# Patient Record
Sex: Female | Born: 1950 | Race: Black or African American | Hispanic: No | Smoking: Never smoker
Health system: Southern US, Community
[De-identification: ages and names within clinical notes are randomized; demographics above are authoritative.]

## PROBLEM LIST (undated history)

## (undated) DIAGNOSIS — N179 Acute kidney failure, unspecified: Secondary | ICD-10-CM

## (undated) DIAGNOSIS — I10 Essential (primary) hypertension: Secondary | ICD-10-CM

## (undated) DIAGNOSIS — N189 Chronic kidney disease, unspecified: Secondary | ICD-10-CM

## (undated) DIAGNOSIS — D649 Anemia, unspecified: Secondary | ICD-10-CM

## (undated) DIAGNOSIS — I639 Cerebral infarction, unspecified: Secondary | ICD-10-CM

## (undated) DIAGNOSIS — M1990 Unspecified osteoarthritis, unspecified site: Secondary | ICD-10-CM

## (undated) DIAGNOSIS — K5792 Diverticulitis of intestine, part unspecified, without perforation or abscess without bleeding: Secondary | ICD-10-CM

## (undated) HISTORY — DX: Essential (primary) hypertension: I10

## (undated) HISTORY — PX: TONSILLECTOMY: SUR1361

---

## 1998-03-29 ENCOUNTER — Emergency Department (HOSPITAL_COMMUNITY): Admission: EM | Admit: 1998-03-29 | Discharge: 1998-03-29 | Payer: Self-pay | Admitting: Emergency Medicine

## 1998-05-02 ENCOUNTER — Other Ambulatory Visit: Admission: RE | Admit: 1998-05-02 | Discharge: 1998-05-02 | Payer: Self-pay | Admitting: Internal Medicine

## 1999-08-22 ENCOUNTER — Encounter: Payer: Self-pay | Admitting: Emergency Medicine

## 1999-08-22 ENCOUNTER — Inpatient Hospital Stay (HOSPITAL_COMMUNITY): Admission: EM | Admit: 1999-08-22 | Discharge: 1999-08-25 | Payer: Self-pay | Admitting: Emergency Medicine

## 1999-09-18 ENCOUNTER — Encounter: Admission: RE | Admit: 1999-09-18 | Discharge: 1999-09-18 | Payer: Self-pay | Admitting: Hematology and Oncology

## 1999-11-11 ENCOUNTER — Encounter: Admission: RE | Admit: 1999-11-11 | Discharge: 1999-11-11 | Payer: Self-pay | Admitting: Internal Medicine

## 2000-08-06 ENCOUNTER — Emergency Department (HOSPITAL_COMMUNITY): Admission: EM | Admit: 2000-08-06 | Discharge: 2000-08-06 | Payer: Self-pay | Admitting: *Deleted

## 2001-10-24 ENCOUNTER — Emergency Department (HOSPITAL_COMMUNITY): Admission: EM | Admit: 2001-10-24 | Discharge: 2001-10-24 | Payer: Self-pay | Admitting: Emergency Medicine

## 2002-01-03 ENCOUNTER — Emergency Department (HOSPITAL_COMMUNITY): Admission: EM | Admit: 2002-01-03 | Discharge: 2002-01-03 | Payer: Self-pay

## 2003-03-07 ENCOUNTER — Emergency Department (HOSPITAL_COMMUNITY): Admission: EM | Admit: 2003-03-07 | Discharge: 2003-03-07 | Payer: Self-pay | Admitting: Emergency Medicine

## 2003-06-12 ENCOUNTER — Emergency Department (HOSPITAL_COMMUNITY): Admission: EM | Admit: 2003-06-12 | Discharge: 2003-06-12 | Payer: Self-pay | Admitting: Emergency Medicine

## 2003-07-31 ENCOUNTER — Inpatient Hospital Stay (HOSPITAL_COMMUNITY): Admission: EM | Admit: 2003-07-31 | Discharge: 2003-08-01 | Payer: Self-pay | Admitting: Emergency Medicine

## 2003-07-31 ENCOUNTER — Encounter: Payer: Self-pay | Admitting: Emergency Medicine

## 2003-08-04 ENCOUNTER — Encounter: Admission: RE | Admit: 2003-08-04 | Discharge: 2003-08-04 | Payer: Self-pay | Admitting: Internal Medicine

## 2005-02-10 ENCOUNTER — Emergency Department (HOSPITAL_COMMUNITY): Admission: EM | Admit: 2005-02-10 | Discharge: 2005-02-10 | Payer: Self-pay | Admitting: Emergency Medicine

## 2005-03-06 ENCOUNTER — Emergency Department (HOSPITAL_COMMUNITY): Admission: EM | Admit: 2005-03-06 | Discharge: 2005-03-06 | Payer: Self-pay | Admitting: Emergency Medicine

## 2005-05-21 ENCOUNTER — Encounter: Payer: Self-pay | Admitting: Emergency Medicine

## 2005-05-21 ENCOUNTER — Inpatient Hospital Stay (HOSPITAL_COMMUNITY): Admission: AD | Admit: 2005-05-21 | Discharge: 2005-05-25 | Payer: Self-pay | Admitting: Internal Medicine

## 2005-05-22 ENCOUNTER — Encounter (INDEPENDENT_AMBULATORY_CARE_PROVIDER_SITE_OTHER): Payer: Self-pay | Admitting: *Deleted

## 2005-05-22 ENCOUNTER — Encounter: Payer: Self-pay | Admitting: *Deleted

## 2005-09-09 ENCOUNTER — Emergency Department (HOSPITAL_COMMUNITY): Admission: EM | Admit: 2005-09-09 | Discharge: 2005-09-09 | Payer: Self-pay | Admitting: Emergency Medicine
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
## 2006-12-20 IMAGING — CT CT HEAD W/O CM
1 series · 16 of 30 positions shown, 20 images · non-contrast
Comparison: none

CLINICAL DATA: Hypertension, left eye droop, weakness

[Series 2: head_seq 4.5 h42s st · axial · 0.43mm/px · z∈[-171,-45]mm · 16 of 32 slices shown, 20 images]
[im 2/32  brain]
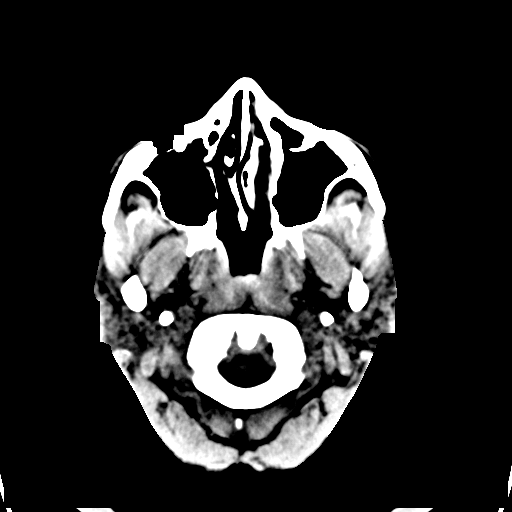
[im 2/32  bone]
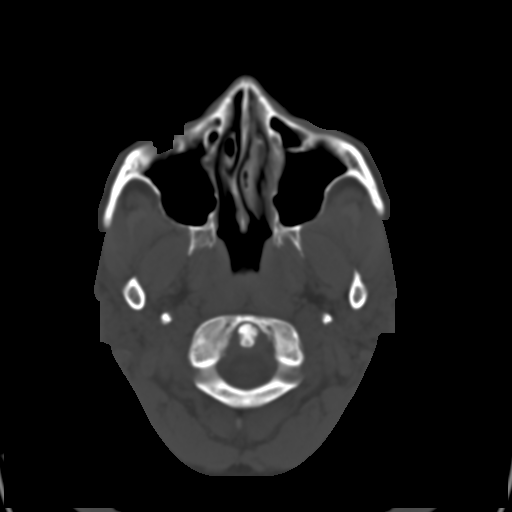
[im 4/32  brain]
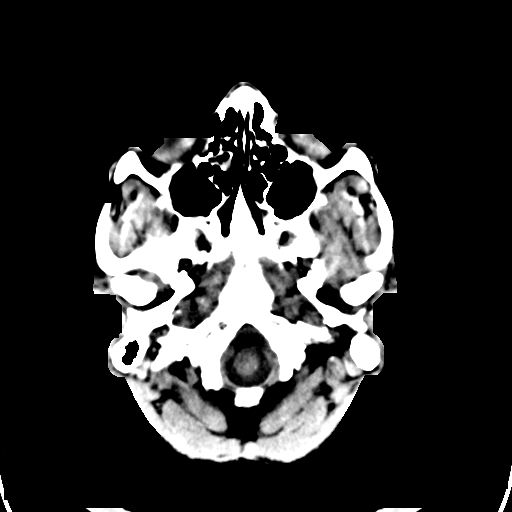
[im 6/32  brain]
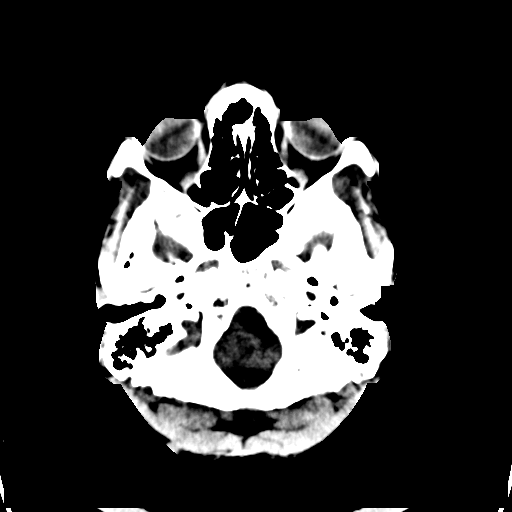
[im 8/32  brain]
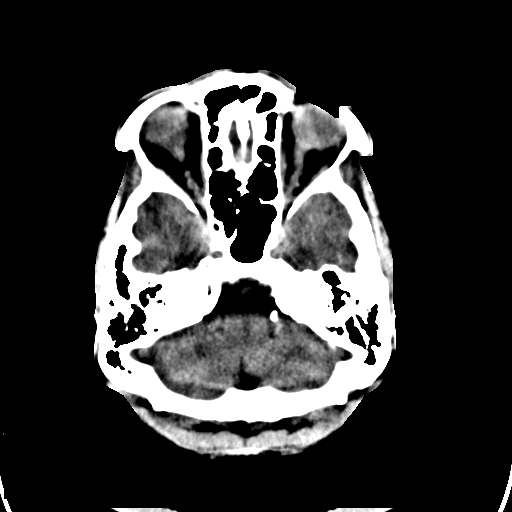
[im 9/32  brain]
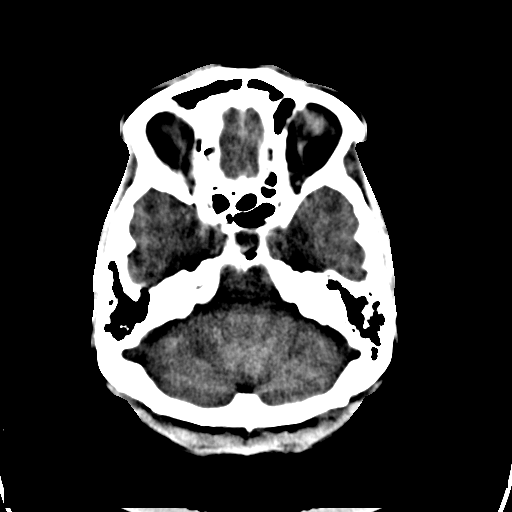
[im 9/32  bone]
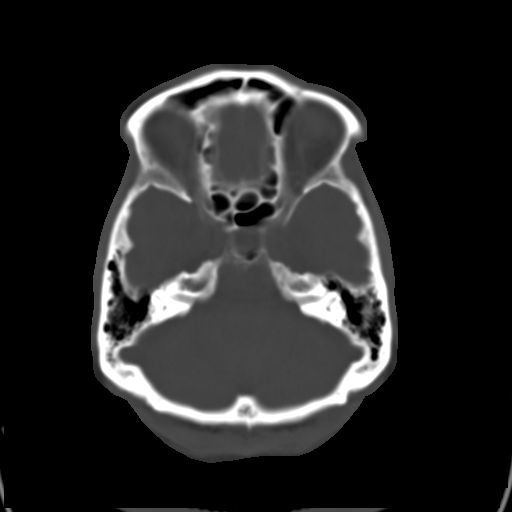
[im 11/32  brain]
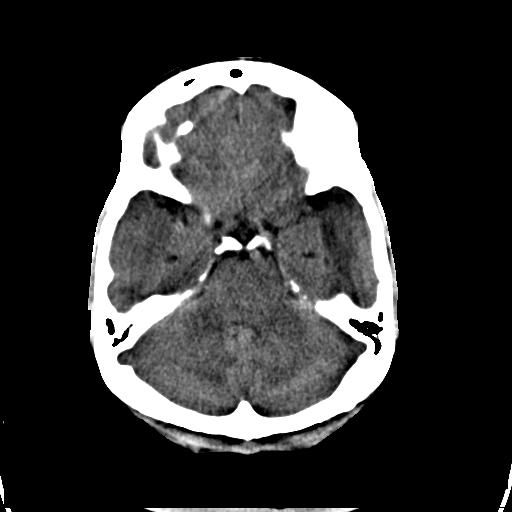
[im 13/32  brain]
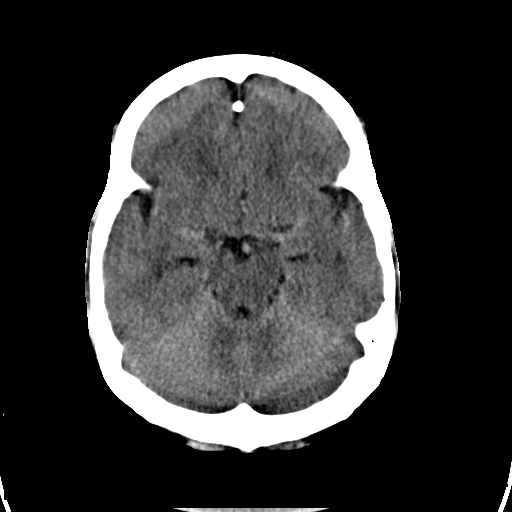
[im 15/32  brain]
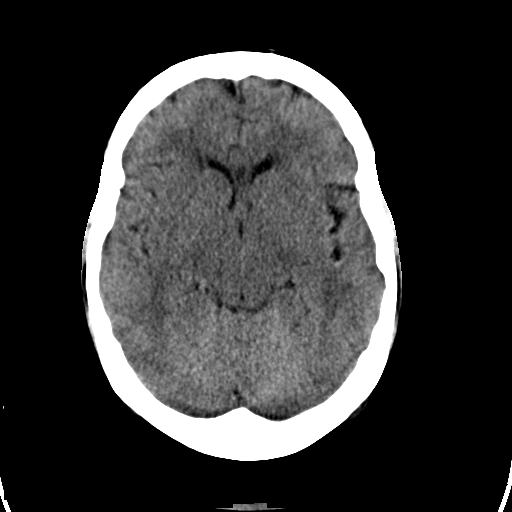
[im 17/32  brain]
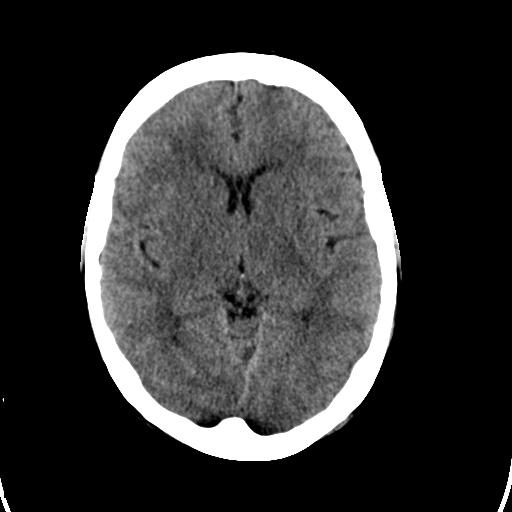
[im 17/32  bone]
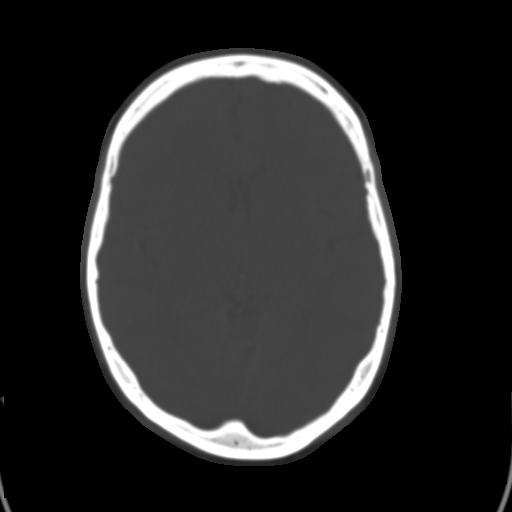
[im 19/32  brain]
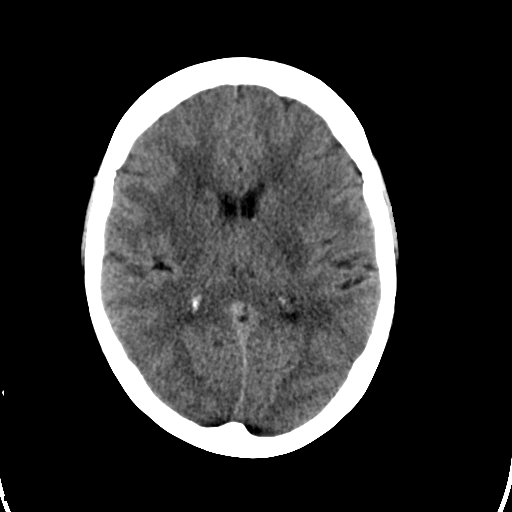
[im 21/32  brain]
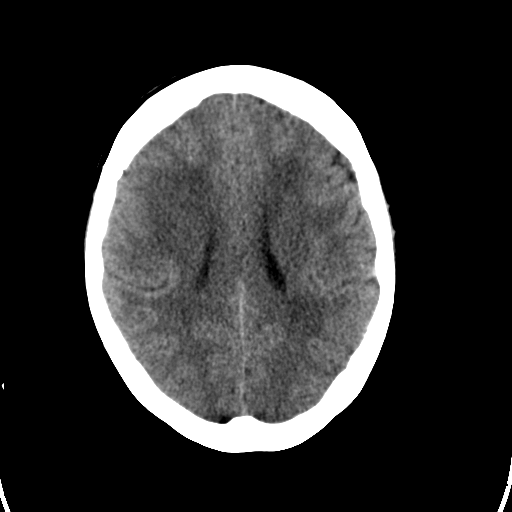
[im 23/32  brain]
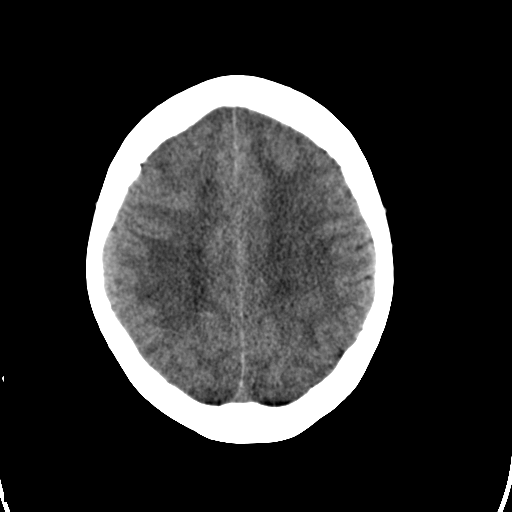
[im 24/32  brain]
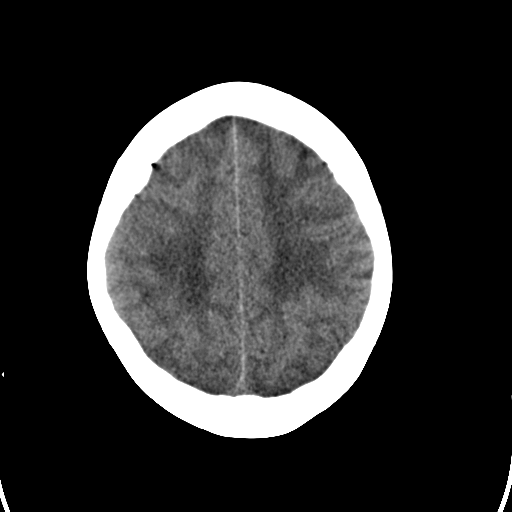
[im 24/32  bone]
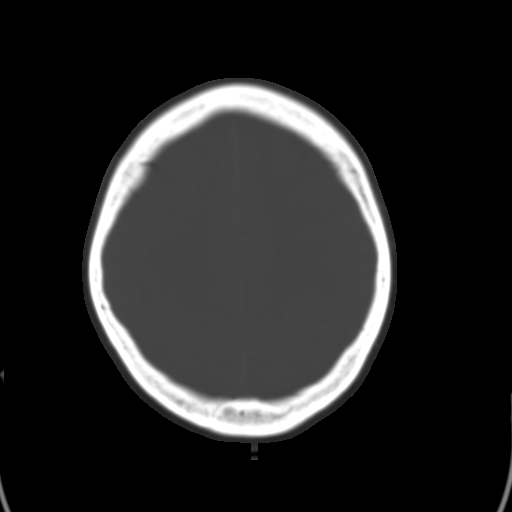
[im 26/32  brain]
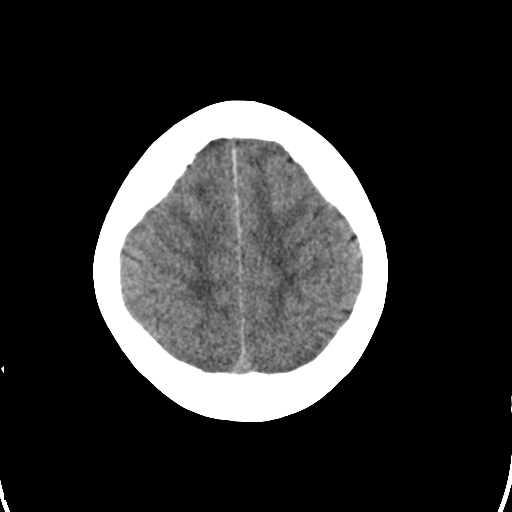
[im 28/32  brain]
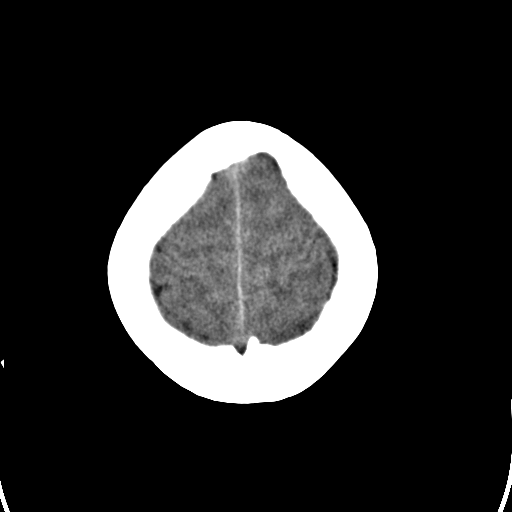
[im 30/32  brain]
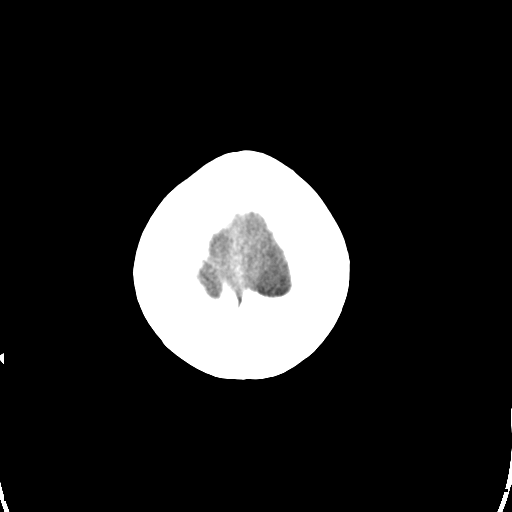

[16 of 30 positions shown; findings below may reference images not displayed]

CT head without contrast:

Comparison 07/31/2003 by report. Mild patchy hypointense regions in deep and
periventricular white matter bilaterally, which were described on the previous
exam. Cavum septum pellucidum. Negative for acute intracranial hemorrhage,
midline shift, or mass-effect. Ventricles and sulci remain symmetric. No
significant atrophy. Bone windows unremarkable.
IMPRESSION: 1. Negative for bleed or other acute intracranial process.
2. Mild nonspecific white matter changes as above

## 2006-12-20 IMAGING — CT CT PELVIS W/ CM
1 of 4 series · 14 of 32 positions shown, 19 images · IV contrast (omnipaque)
Comparison: None

ABDOMEN CT WITH CONTRAST

CLINICAL DATA: Right leg pain. Hypertension. Question left upper quadrant mass
or AAA.
TECHNIQUE: Multidetector CT imaging of the abdomen and pelvis was performed
following the standard protocol during bolus administration of intravenous
contrast.

Contrast:  125 cc Omnipaque 300

[Series 2: abd_pel 5.0 b40f st · axial · 0.62mm/px · z∈[-434,-60]mm · 14 of 85 slices shown, 19 images]
[im 5/85  soft-tissue]
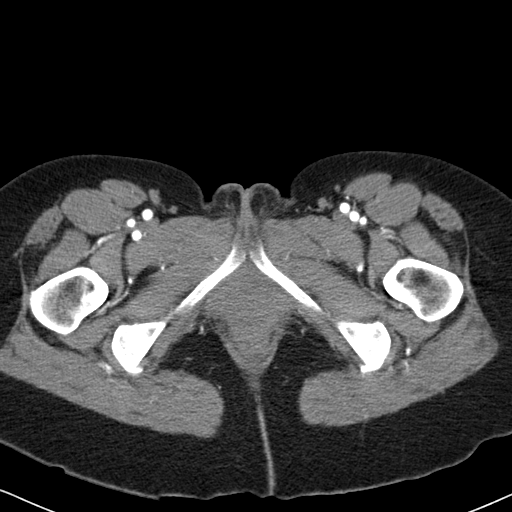
[im 5/85  bone]
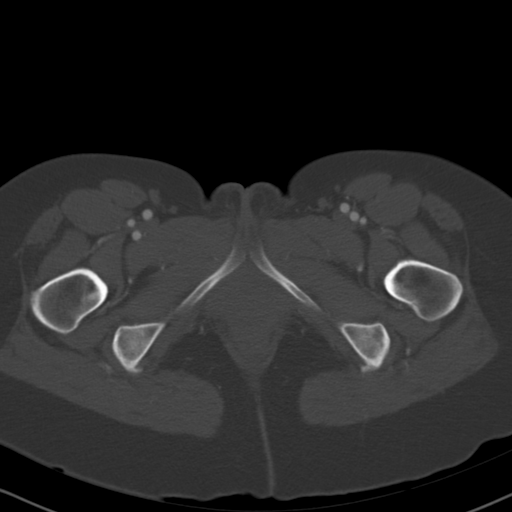
[im 14/85  soft-tissue]
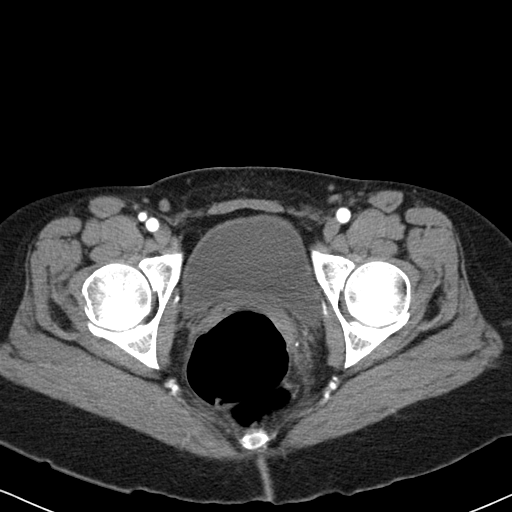
[im 18/85  soft-tissue]
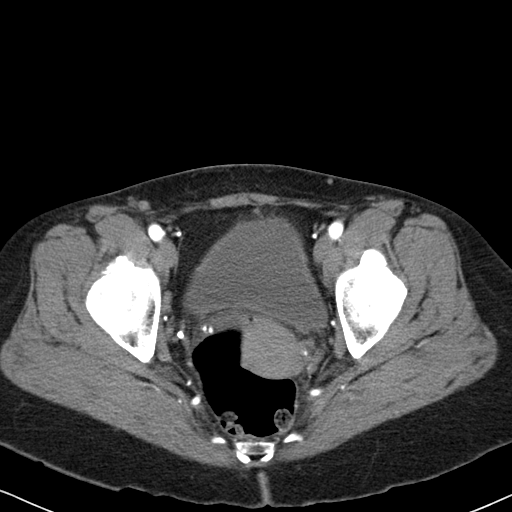
[im 23/85  soft-tissue]
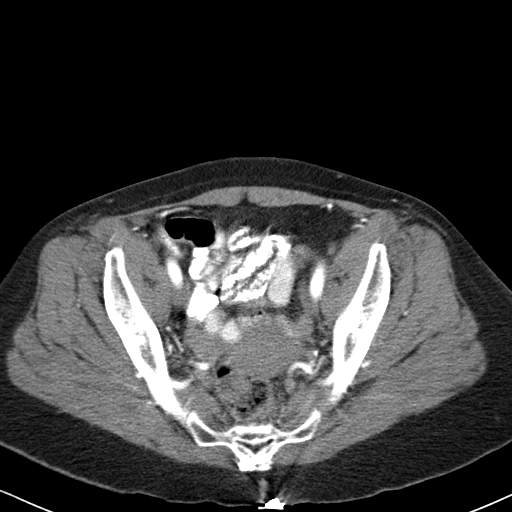
[im 31/85  soft-tissue]
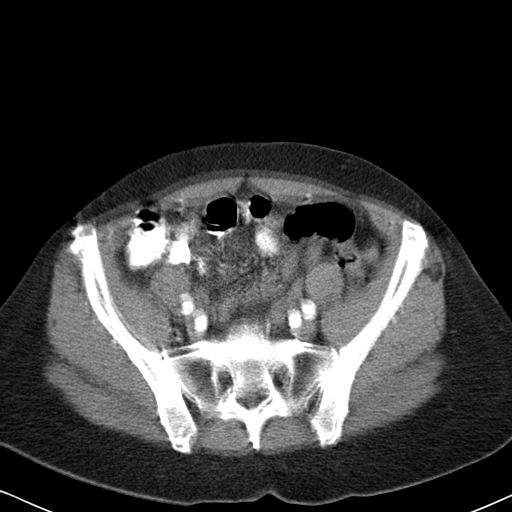
[im 36/85  soft-tissue]
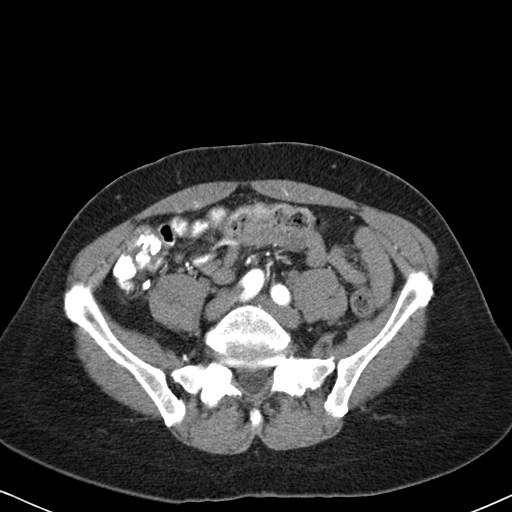
[im 45/85  soft-tissue]
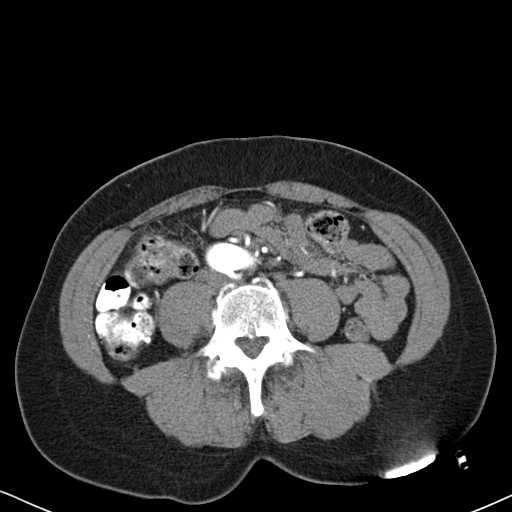
[im 49/85  soft-tissue]
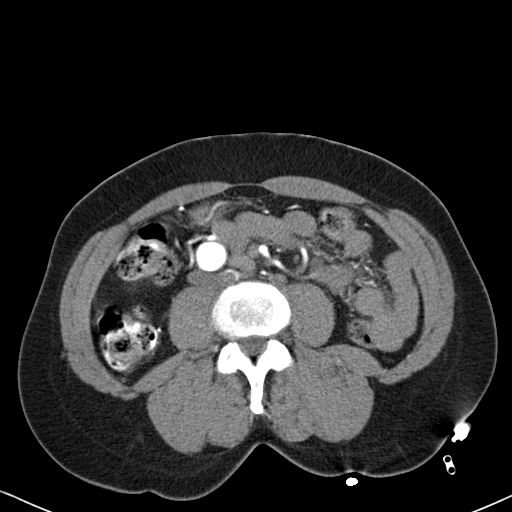
[im 54/85  soft-tissue]
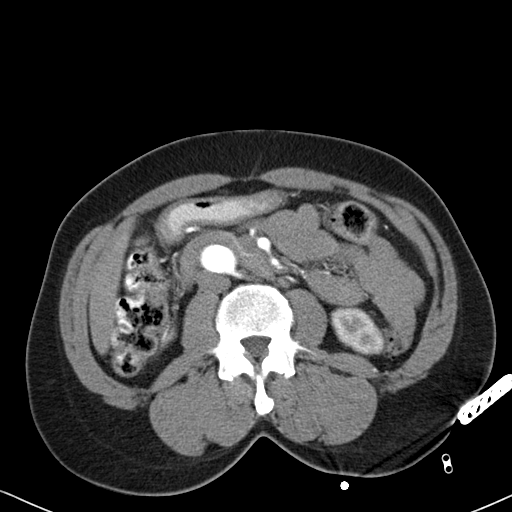
[im 54/85  bone]
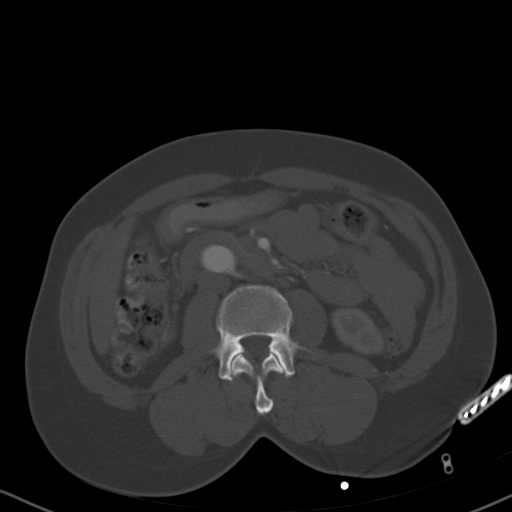
[im 62/85  soft-tissue]
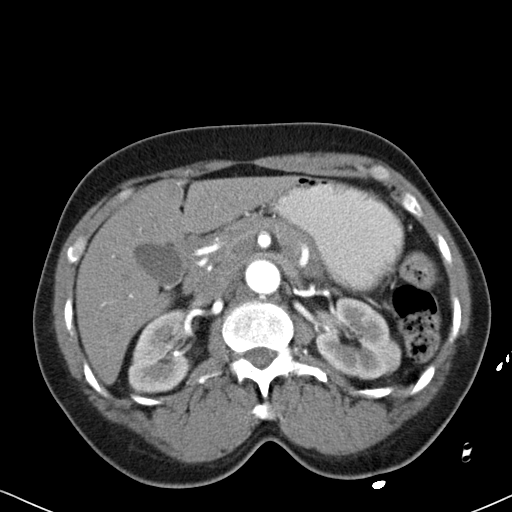
[im 67/85  soft-tissue]
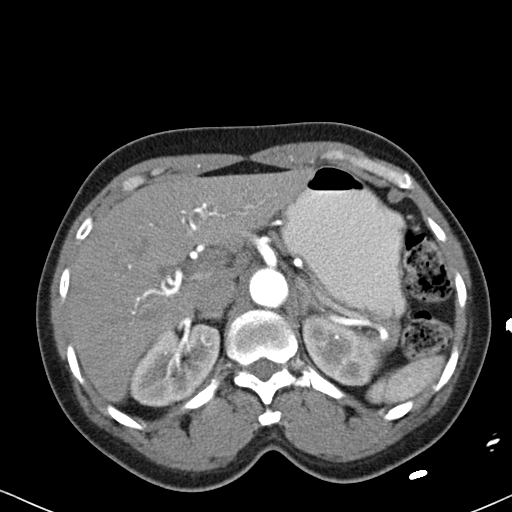
[im 67/85  lung]
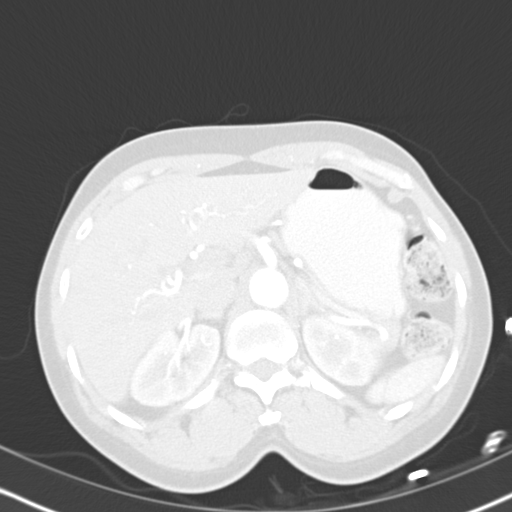
[im 71/85  soft-tissue]
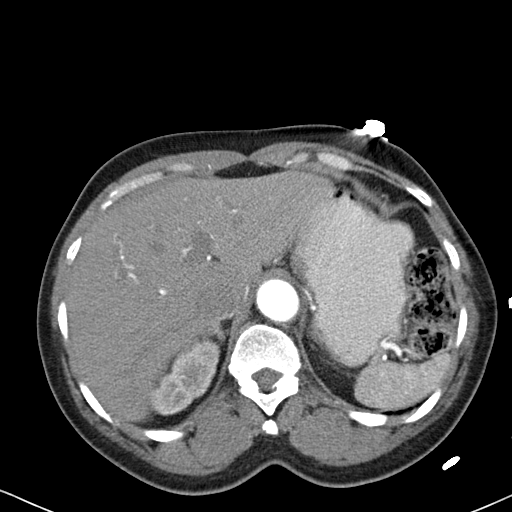
[im 71/85  lung]
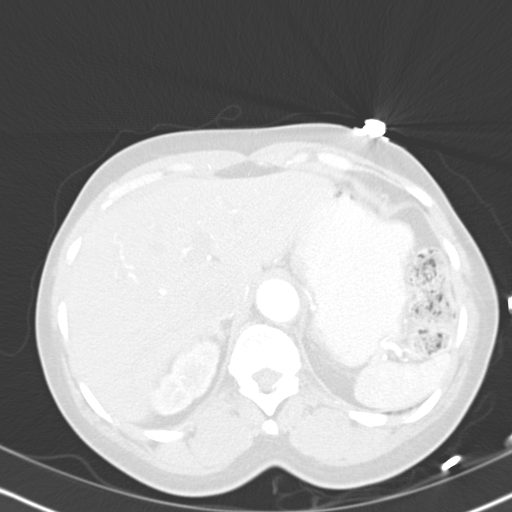
[im 76/85  lung]
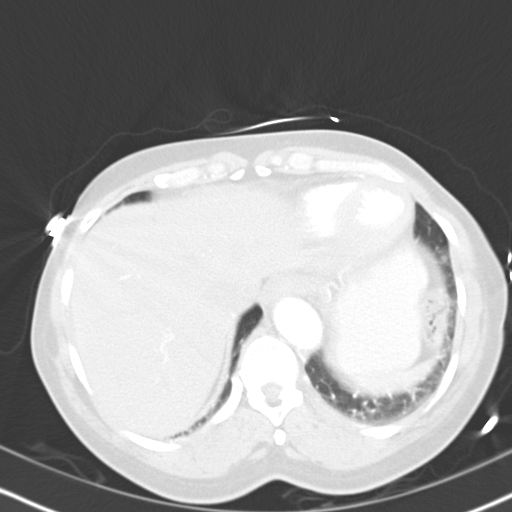
[im 80/85  soft-tissue]
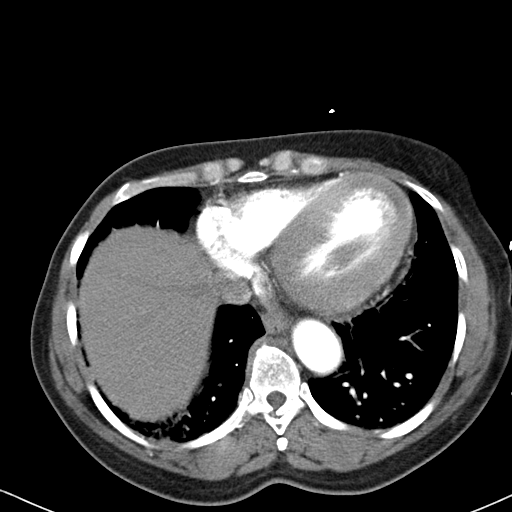
[im 80/85  lung]
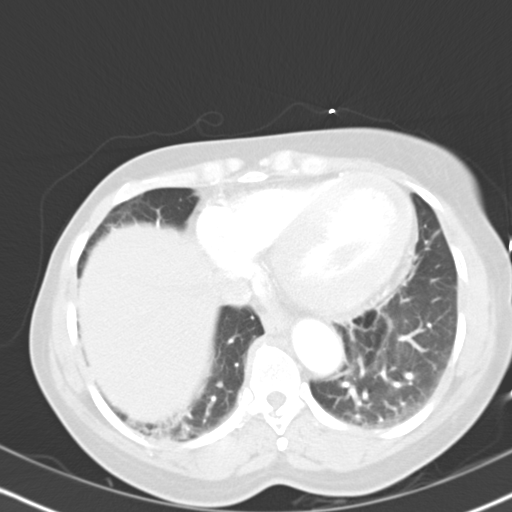

[14 of 32 positions shown; findings below may reference images not displayed]

FINDINGS: There is tortuosity and ectasia of the thoracoabdominal aorta.
Maximum diameter of the distal descending thoracic aorta is 2.7 cm. Maximum
diameter of the abdominal aorta is just above the bifurcation, measuring 1.9 cm.
Mild ectasia of the iliac vessels as well. No evidence of aneurysm. There is
cardiomegaly. Minimal bibasilar atelectasis. Liver, spleen, pancreas, adrenals,
kidneys unremarkable. No left upper quadrant mass. A moderate amount of stool
seen throughout the colon.

IMPRESSION

Tortuosity of the thoracoabdominal aorta. Mild ectasia of the descending distal
thoracic aorta.

Cardiomegaly.

No acute findings.

PELVIS CT WITH CONTRAST
FINDINGS: Tortuosity the iliac vessels. Pelvic structures unremarkable. Bowel
grossly unremarkable. No free fluid, free air, or adenopathy. Images somewhat
limited by patient motion.

IMPRESSION

No acute findings in the pelvis.

## 2006-12-23 IMAGING — CR DG HIP COMPLETE 2+V*R*
3 series · 3 of 3 positions shown · non-contrast
Comparison: none

CLINICAL DATA: hip pain

Right hip 2 view:
No previous for comparison. Prominent spurs from the superior margin of the
acetabula bilaterally. Small femoral head spurs are also evident on the frogleg
view. Negative for fracture, dislocation, or other acute bone injury. Residual
contrast is noted in the colon.

[view not recorded (1 of 3)]
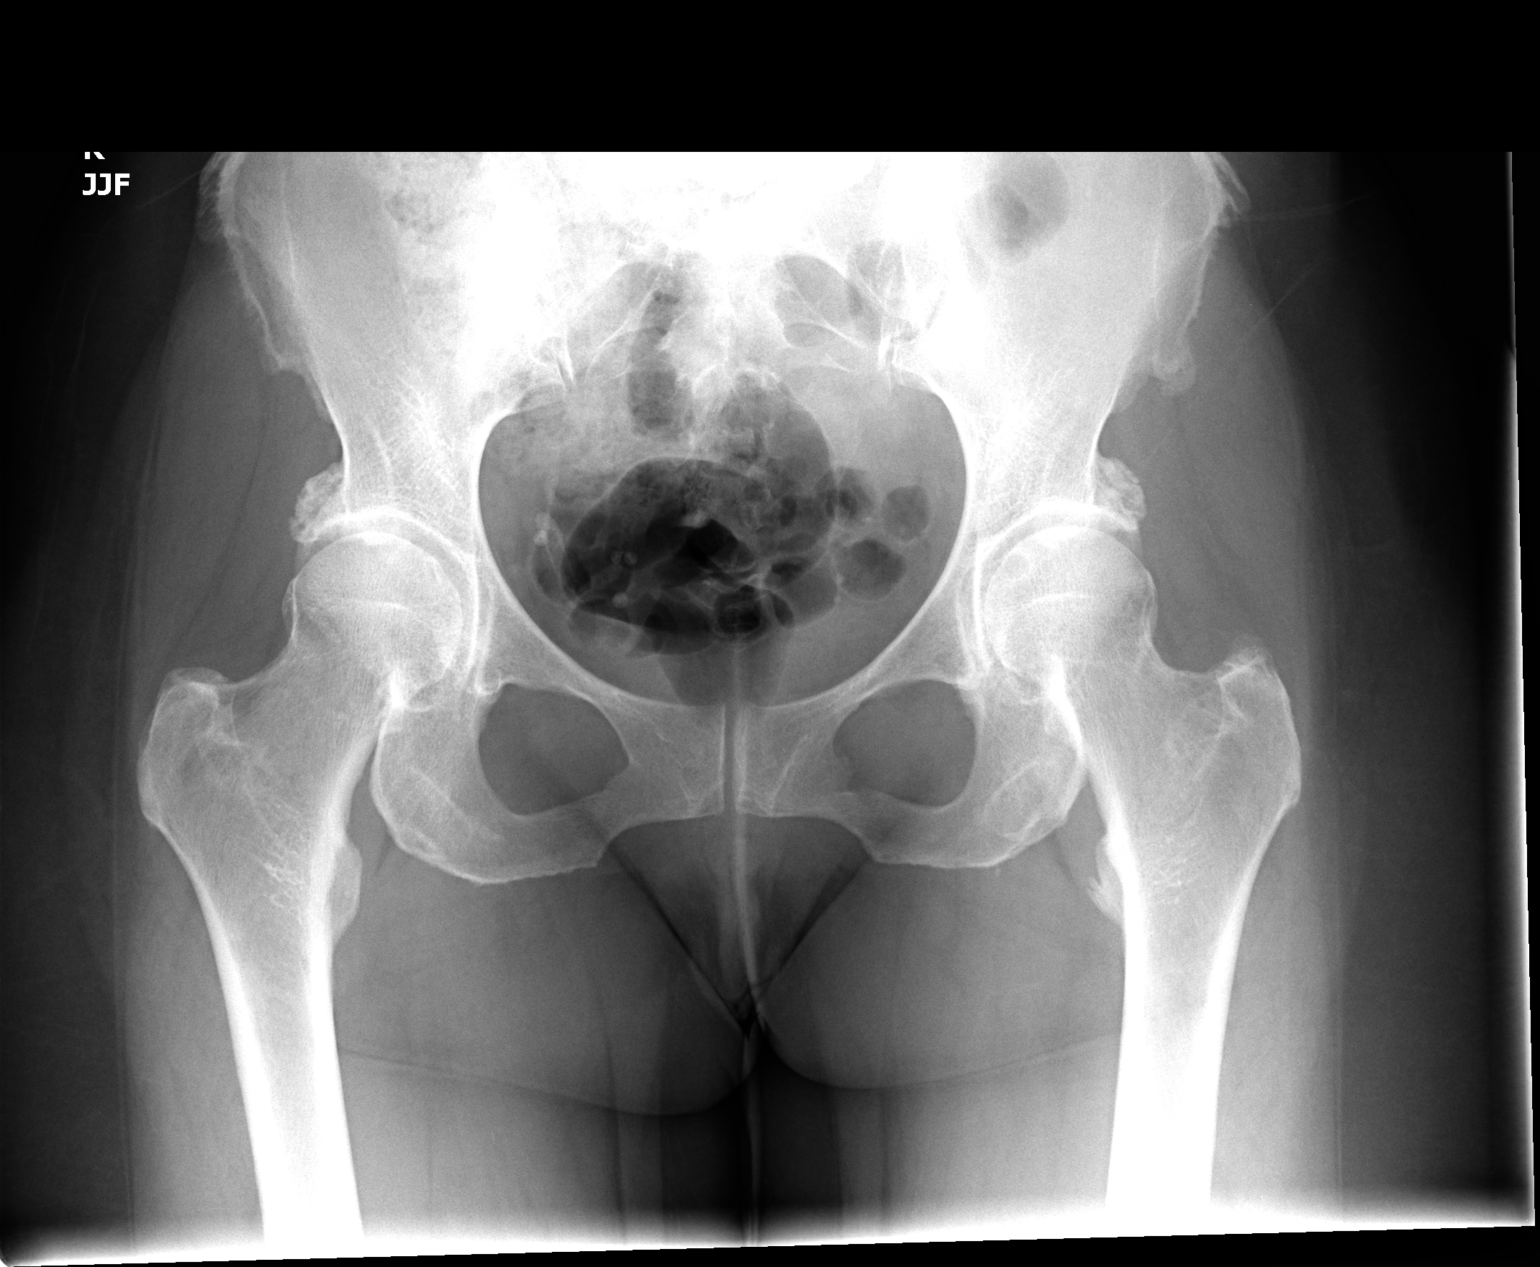

[view not recorded (2 of 3)]
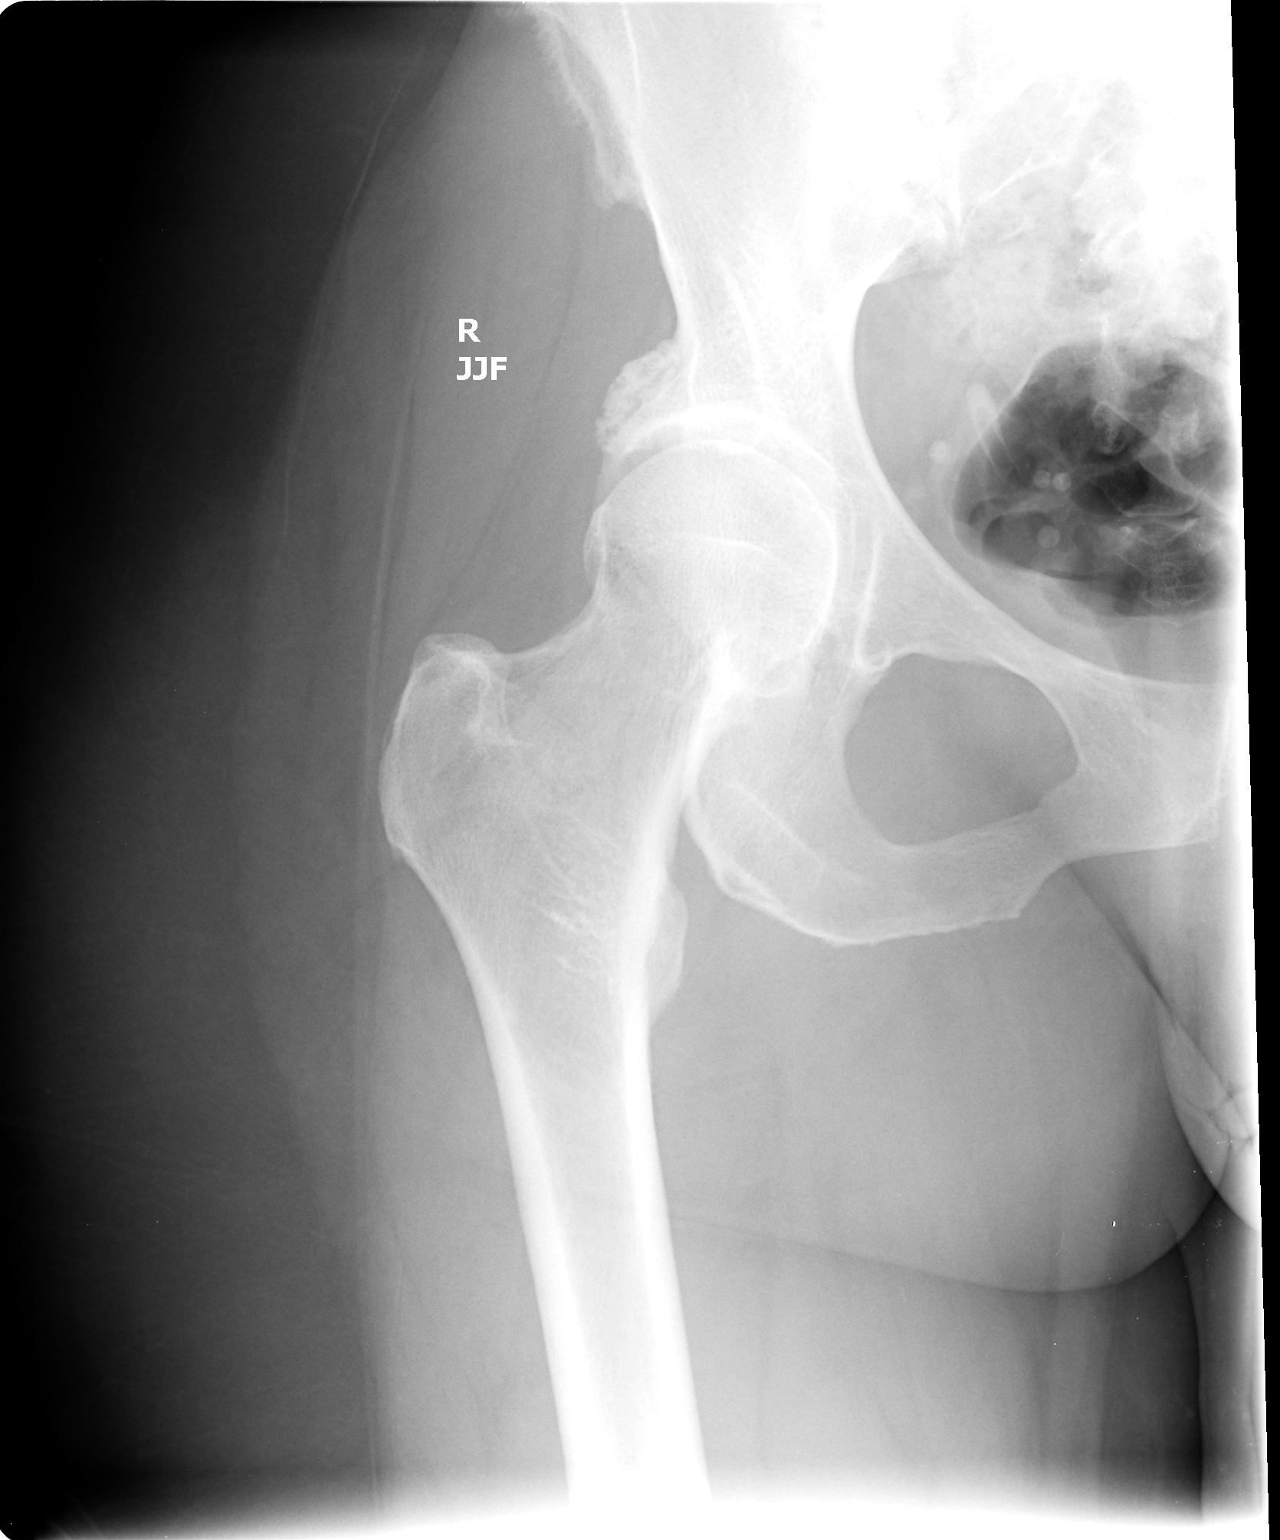

[view not recorded (3 of 3)]
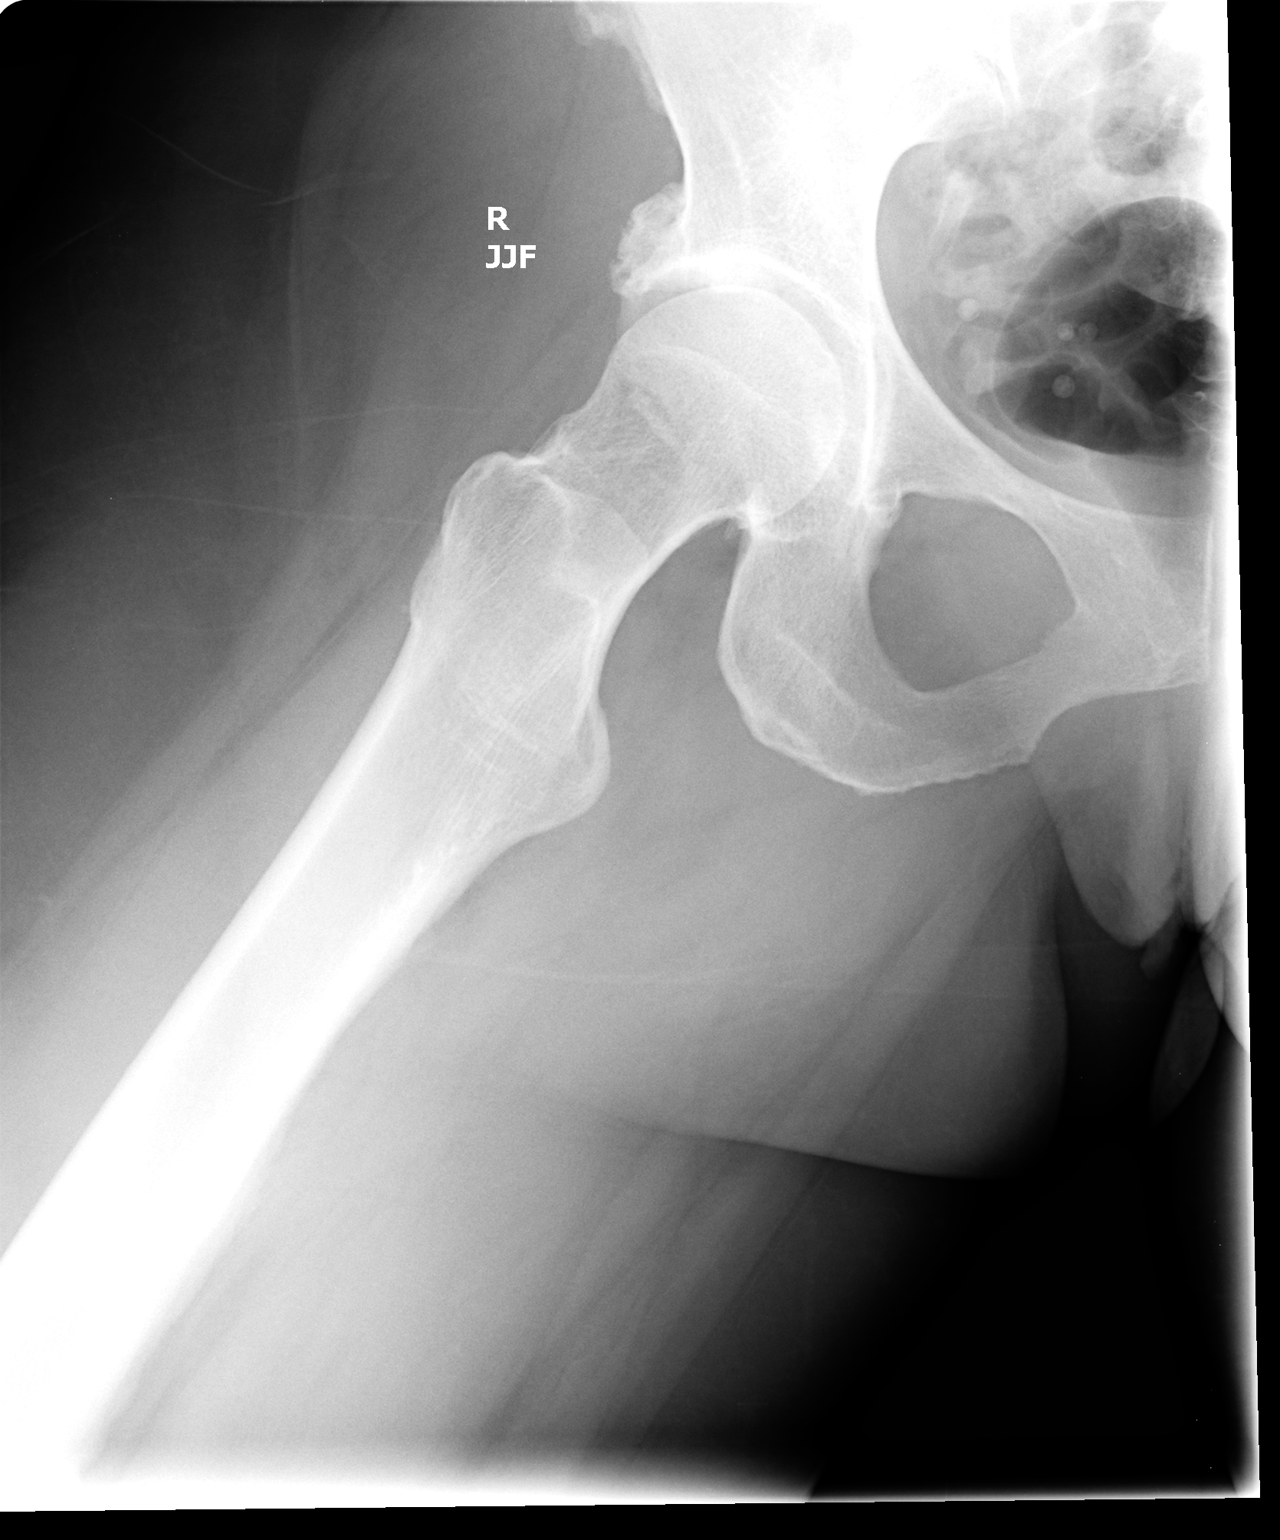

[3 of 3 positions shown; findings below may reference images not displayed]

IMPRESSION: 1. Bilateral hip degenerative spurring without acute abnormality

## 2007-01-08 ENCOUNTER — Emergency Department (HOSPITAL_COMMUNITY): Admission: EM | Admit: 2007-01-08 | Discharge: 2007-01-08 | Payer: Self-pay | Admitting: Emergency Medicine

## 2007-01-25 ENCOUNTER — Emergency Department (HOSPITAL_COMMUNITY): Admission: EM | Admit: 2007-01-25 | Discharge: 2007-01-25 | Payer: Self-pay | Admitting: Emergency Medicine

## 2007-02-13 ENCOUNTER — Emergency Department (HOSPITAL_COMMUNITY): Admission: EM | Admit: 2007-02-13 | Discharge: 2007-02-13 | Payer: Self-pay | Admitting: Emergency Medicine

## 2007-05-06 ENCOUNTER — Emergency Department (HOSPITAL_COMMUNITY): Admission: EM | Admit: 2007-05-06 | Discharge: 2007-05-06 | Payer: Self-pay | Admitting: Emergency Medicine

## 2007-05-10 ENCOUNTER — Ambulatory Visit: Payer: Self-pay | Admitting: Internal Medicine

## 2007-05-11 ENCOUNTER — Ambulatory Visit: Payer: Self-pay | Admitting: *Deleted

## 2007-05-24 ENCOUNTER — Ambulatory Visit: Payer: Self-pay | Admitting: Family Medicine

## 2007-05-24 LAB — CONVERTED CEMR LAB
ANA Titer 1: 1:80 {titer} — ABNORMAL HIGH
Anti Nuclear Antibody(ANA): POSITIVE — AB
Rheumatoid fact SerPl-aCnc: 131 intl units/mL — ABNORMAL HIGH (ref 0–20)
Sed Rate: 41 mm/hr — ABNORMAL HIGH (ref 0–22)

## 2007-05-25 ENCOUNTER — Ambulatory Visit (HOSPITAL_COMMUNITY): Admission: RE | Admit: 2007-05-25 | Discharge: 2007-05-25 | Payer: Self-pay | Admitting: Family Medicine

## 2007-05-27 ENCOUNTER — Ambulatory Visit: Payer: Self-pay | Admitting: Family Medicine

## 2007-06-24 DIAGNOSIS — N183 Chronic kidney disease, stage 3 unspecified: Secondary | ICD-10-CM | POA: Insufficient documentation

## 2007-06-24 DIAGNOSIS — M069 Rheumatoid arthritis, unspecified: Secondary | ICD-10-CM | POA: Insufficient documentation

## 2007-06-24 DIAGNOSIS — I10 Essential (primary) hypertension: Secondary | ICD-10-CM | POA: Insufficient documentation

## 2007-06-25 ENCOUNTER — Ambulatory Visit: Payer: Self-pay | Admitting: Family Medicine

## 2007-06-25 LAB — CONVERTED CEMR LAB
BUN: 24 mg/dL — ABNORMAL HIGH (ref 6–23)
Basophils Absolute: 0 10*3/uL (ref 0.0–0.1)
Basophils Relative: 0 % (ref 0–1)
CO2: 26 meq/L (ref 19–32)
Calcium: 10.3 mg/dL (ref 8.4–10.5)
Chloride: 102 meq/L (ref 96–112)
Creatinine, Ser: 1.23 mg/dL — ABNORMAL HIGH (ref 0.40–1.20)
Eosinophils Absolute: 0.1 10*3/uL (ref 0.0–0.7)
Eosinophils Relative: 1 % (ref 0–5)
Glucose, Bld: 92 mg/dL (ref 70–99)
HCT: 37.1 % (ref 36.0–46.0)
Hemoglobin: 12.1 g/dL (ref 12.0–15.0)
Lymphocytes Relative: 19 % (ref 12–46)
Lymphs Abs: 1.8 10*3/uL (ref 0.7–3.3)
MCHC: 32.6 g/dL (ref 30.0–36.0)
MCV: 92.5 fL (ref 78.0–100.0)
Monocytes Absolute: 0.4 10*3/uL (ref 0.2–0.7)
Monocytes Relative: 4 % (ref 3–11)
Neutro Abs: 7.1 10*3/uL (ref 1.7–7.7)
Neutrophils Relative %: 75 % (ref 43–77)
Platelets: 292 10*3/uL (ref 150–400)
Potassium: 4.4 meq/L (ref 3.5–5.3)
RBC: 4.01 M/uL (ref 3.87–5.11)
RDW: 15.3 % — ABNORMAL HIGH (ref 11.5–14.0)
Sodium: 141 meq/L (ref 135–145)
WBC: 9.5 10*3/uL (ref 4.0–10.5)

## 2007-07-12 ENCOUNTER — Ambulatory Visit: Payer: Self-pay | Admitting: Family Medicine

## 2007-07-12 LAB — CONVERTED CEMR LAB
ALT: 13 units/L (ref 0–35)
AST: 13 units/L (ref 0–37)
Albumin: 4.4 g/dL (ref 3.5–5.2)
Alkaline Phosphatase: 107 units/L (ref 39–117)
BUN: 29 mg/dL — ABNORMAL HIGH (ref 6–23)
Basophils Absolute: 0 10*3/uL (ref 0.0–0.1)
Basophils Relative: 0 % (ref 0–1)
CO2: 27 meq/L (ref 19–32)
Calcium: 9.6 mg/dL (ref 8.4–10.5)
Chloride: 103 meq/L (ref 96–112)
Creatinine, Ser: 1.37 mg/dL — ABNORMAL HIGH (ref 0.40–1.20)
Eosinophils Absolute: 0 10*3/uL (ref 0.0–0.7)
Eosinophils Relative: 0 % (ref 0–5)
Glucose, Bld: 115 mg/dL — ABNORMAL HIGH (ref 70–99)
HCT: 36.8 % (ref 36.0–46.0)
Hemoglobin: 11.7 g/dL — ABNORMAL LOW (ref 12.0–15.0)
Lymphocytes Relative: 19 % (ref 12–46)
Lymphs Abs: 1.6 10*3/uL (ref 0.7–3.3)
MCHC: 31.8 g/dL (ref 30.0–36.0)
MCV: 94.1 fL (ref 78.0–100.0)
Monocytes Absolute: 0.3 10*3/uL (ref 0.2–0.7)
Monocytes Relative: 4 % (ref 3–11)
Neutro Abs: 6.3 10*3/uL (ref 1.7–7.7)
Neutrophils Relative %: 77 % (ref 43–77)
Platelets: 307 10*3/uL (ref 150–400)
Potassium: 5.2 meq/L (ref 3.5–5.3)
RBC: 3.91 M/uL (ref 3.87–5.11)
RDW: 17 % — ABNORMAL HIGH (ref 11.5–14.0)
Sodium: 141 meq/L (ref 135–145)
Total Bilirubin: 0.4 mg/dL (ref 0.3–1.2)
Total Protein: 8.2 g/dL (ref 6.0–8.3)
WBC: 8.2 10*3/uL (ref 4.0–10.5)

## 2007-10-26 ENCOUNTER — Emergency Department (HOSPITAL_COMMUNITY): Admission: EM | Admit: 2007-10-26 | Discharge: 2007-10-26 | Payer: Self-pay | Admitting: *Deleted

## 2007-10-29 ENCOUNTER — Ambulatory Visit: Payer: Self-pay | Admitting: Family Medicine

## 2008-01-25 ENCOUNTER — Ambulatory Visit: Payer: Self-pay | Admitting: Family Medicine

## 2008-01-25 LAB — CONVERTED CEMR LAB
ALT: 14 units/L (ref 0–35)
AST: 23 units/L (ref 0–37)
Albumin: 4.4 g/dL (ref 3.5–5.2)
Alkaline Phosphatase: 98 units/L (ref 39–117)
BUN: 35 mg/dL — ABNORMAL HIGH (ref 6–23)
Basophils Absolute: 0 10*3/uL (ref 0.0–0.1)
Basophils Relative: 0 % (ref 0–1)
CO2: 23 meq/L (ref 19–32)
Calcium: 9.6 mg/dL (ref 8.4–10.5)
Chloride: 101 meq/L (ref 96–112)
Creatinine, Ser: 1.55 mg/dL — ABNORMAL HIGH (ref 0.40–1.20)
Eosinophils Absolute: 0.1 10*3/uL (ref 0.0–0.7)
Eosinophils Relative: 2 % (ref 0–5)
Glucose, Bld: 93 mg/dL (ref 70–99)
HCT: 34.5 % — ABNORMAL LOW (ref 36.0–46.0)
Hemoglobin: 11.2 g/dL — ABNORMAL LOW (ref 12.0–15.0)
Lymphocytes Relative: 24 % (ref 12–46)
Lymphs Abs: 1.6 10*3/uL (ref 0.7–4.0)
MCHC: 32.5 g/dL (ref 30.0–36.0)
MCV: 93 fL (ref 78.0–100.0)
Monocytes Absolute: 0.3 10*3/uL (ref 0.1–1.0)
Monocytes Relative: 4 % (ref 3–12)
Neutro Abs: 4.8 10*3/uL (ref 1.7–7.7)
Neutrophils Relative %: 70 % (ref 43–77)
Platelets: 376 10*3/uL (ref 150–400)
Potassium: 4.2 meq/L (ref 3.5–5.3)
RBC: 3.71 M/uL — ABNORMAL LOW (ref 3.87–5.11)
RDW: 13.9 % (ref 11.5–15.5)
Rhuematoid fact SerPl-aCnc: 94 intl units/mL — ABNORMAL HIGH (ref 0–20)
Sodium: 140 meq/L (ref 135–145)
Total Bilirubin: 0.3 mg/dL (ref 0.3–1.2)
Total Protein: 8.4 g/dL — ABNORMAL HIGH (ref 6.0–8.3)
WBC: 6.8 10*3/uL (ref 4.0–10.5)

## 2008-03-23 ENCOUNTER — Emergency Department (HOSPITAL_COMMUNITY): Admission: EM | Admit: 2008-03-23 | Discharge: 2008-03-23 | Payer: Self-pay | Admitting: Emergency Medicine

## 2008-07-06 ENCOUNTER — Ambulatory Visit: Payer: Self-pay | Admitting: Family Medicine

## 2008-08-08 IMAGING — CR DG CHEST 2V
2 series · 2 of 2 positions shown · non-contrast
Comparison: None

CLINICAL DATA: Chest pain, shortness of breath

CHEST - 2 VIEW:

[view not recorded (1 of 2)]
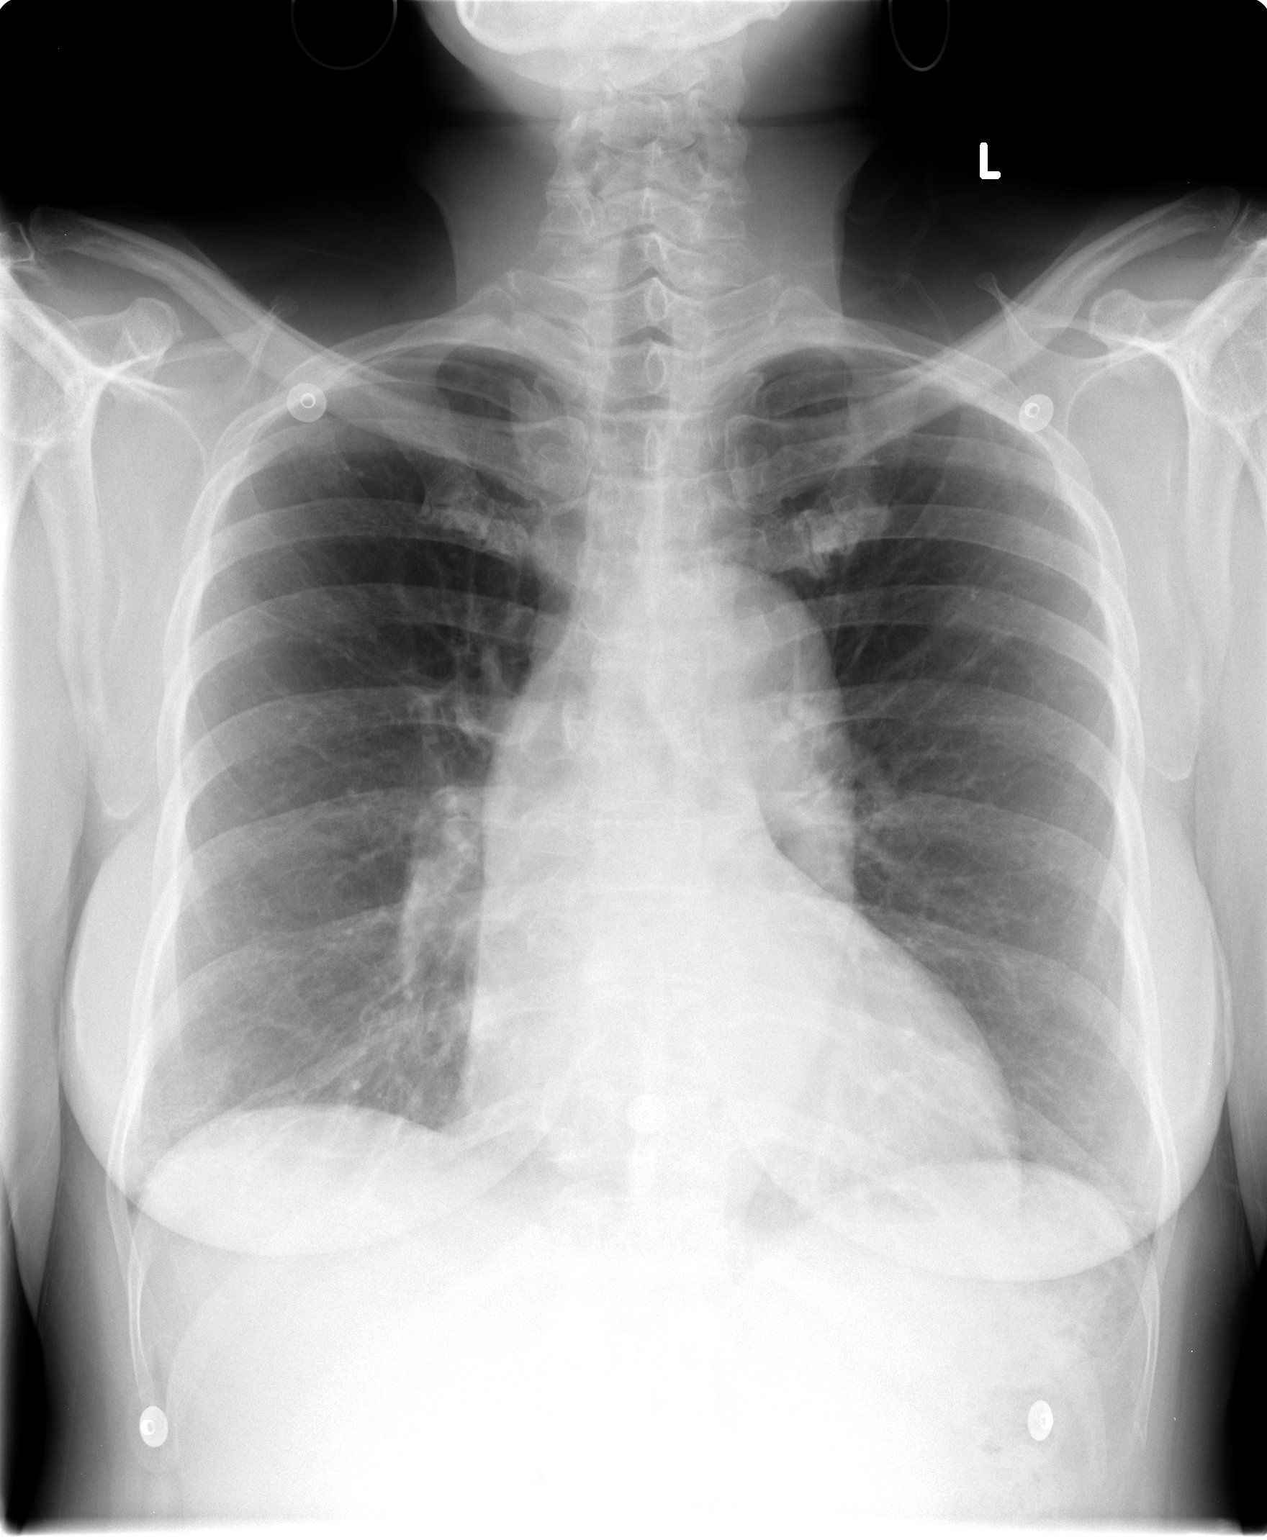

[view not recorded (2 of 2)]
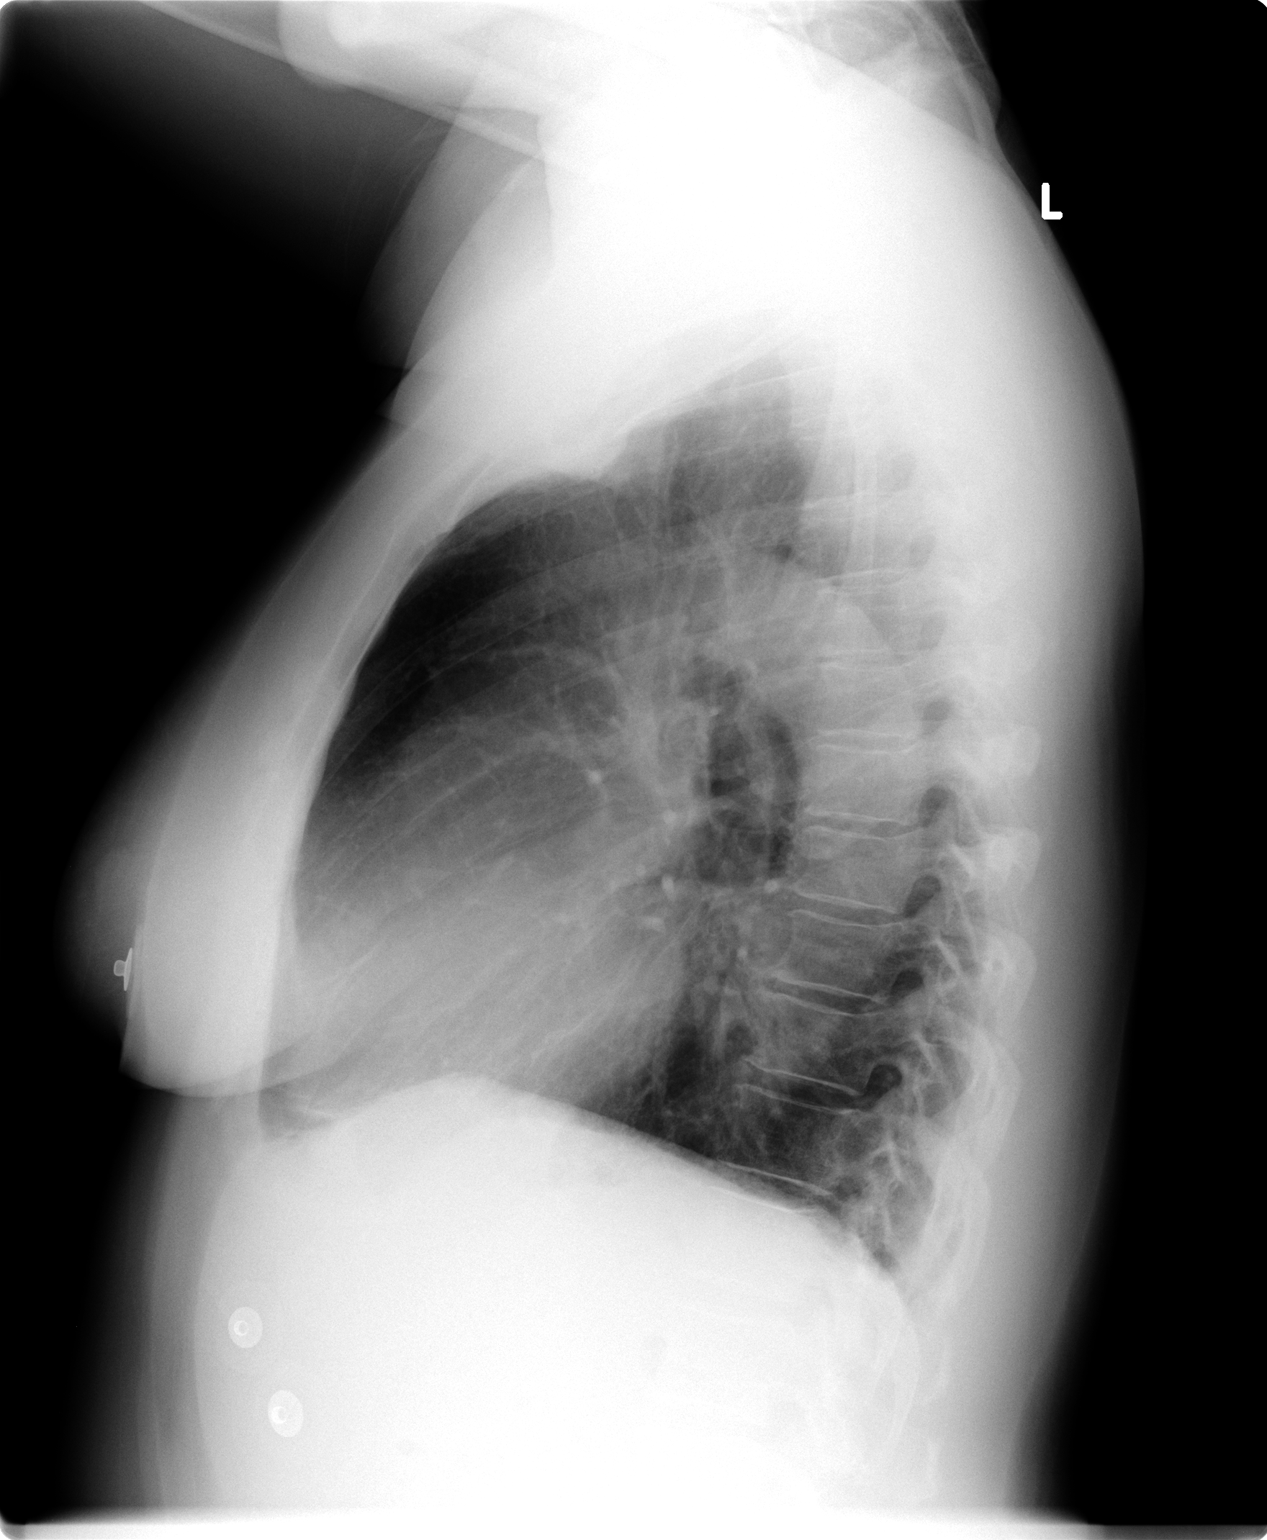

[2 of 2 positions shown; findings below may reference images not displayed]

FINDINGS: Heart is mildly enlarged. There is tortuosity and ectasia of the
thoracic aorta. No focal airspace opacities or effusion. Visualized skeleton
unremarkable.
IMPRESSION: Cardiomegaly. No active disease.

## 2008-08-15 ENCOUNTER — Ambulatory Visit: Payer: Self-pay | Admitting: Family Medicine

## 2008-08-15 LAB — CONVERTED CEMR LAB
ALT: 15 units/L (ref 0–35)
AST: 12 units/L (ref 0–37)
Albumin: 4.3 g/dL (ref 3.5–5.2)
Alkaline Phosphatase: 97 units/L (ref 39–117)
BUN: 36 mg/dL — ABNORMAL HIGH (ref 6–23)
Basophils Absolute: 0 10*3/uL (ref 0.0–0.1)
Basophils Relative: 0 % (ref 0–1)
CO2: 24 meq/L (ref 19–32)
Calcium: 9.5 mg/dL (ref 8.4–10.5)
Chloride: 103 meq/L (ref 96–112)
Cholesterol: 205 mg/dL — ABNORMAL HIGH (ref 0–200)
Creatinine, Ser: 1.44 mg/dL — ABNORMAL HIGH (ref 0.40–1.20)
Eosinophils Absolute: 0.1 10*3/uL (ref 0.0–0.7)
Eosinophils Relative: 1 % (ref 0–5)
Glucose, Bld: 98 mg/dL (ref 70–99)
HCT: 37.8 % (ref 36.0–46.0)
HDL: 84 mg/dL (ref >39)
Hemoglobin: 11.9 g/dL — ABNORMAL LOW (ref 12.0–15.0)
LDL Cholesterol: 103 mg/dL — ABNORMAL HIGH (ref 0–99)
Lymphocytes Relative: 18 % (ref 12–46)
Lymphs Abs: 1.4 10*3/uL (ref 0.7–4.0)
MCHC: 31.5 g/dL (ref 30.0–36.0)
MCV: 99.5 fL (ref 78.0–100.0)
Monocytes Absolute: 0.3 10*3/uL (ref 0.1–1.0)
Monocytes Relative: 4 % (ref 3–12)
Neutro Abs: 6 10*3/uL (ref 1.7–7.7)
Neutrophils Relative %: 77 % (ref 43–77)
Platelets: 300 10*3/uL (ref 150–400)
Potassium: 4.4 meq/L (ref 3.5–5.3)
RBC: 3.8 M/uL — ABNORMAL LOW (ref 3.87–5.11)
RDW: 15.6 % — ABNORMAL HIGH (ref 11.5–15.5)
Sodium: 141 meq/L (ref 135–145)
Total Bilirubin: 0.3 mg/dL (ref 0.3–1.2)
Total CHOL/HDL Ratio: 2.4
Total Protein: 7.8 g/dL (ref 6.0–8.3)
Triglycerides: 88 mg/dL (ref <150)
VLDL: 18 mg/dL (ref 0–40)
Vit D, 1,25-Dihydroxy: 39 (ref 30–89)
WBC: 7.7 10*3/uL (ref 4.0–10.5)

## 2008-08-25 IMAGING — CT CT HEAD W/O CM
1 series · 16 of 30 positions shown, 20 images · IV contrast (agent unspecified)
Comparison: 05/21/05.

CLINICAL DATA: Headache and hypertension.  
 HEAD CT WITHOUT CONTRAST:
TECHNIQUE: Contiguous axial images were obtained from the base of the skull through the vertex according to standard protocol without contrast.

[Series 2: head_seq 4.5 h45s st · axial · 0.43mm/px · z∈[+1273,+1399]mm · 16 of 32 slices shown, 20 images]
[im 2/32  brain]
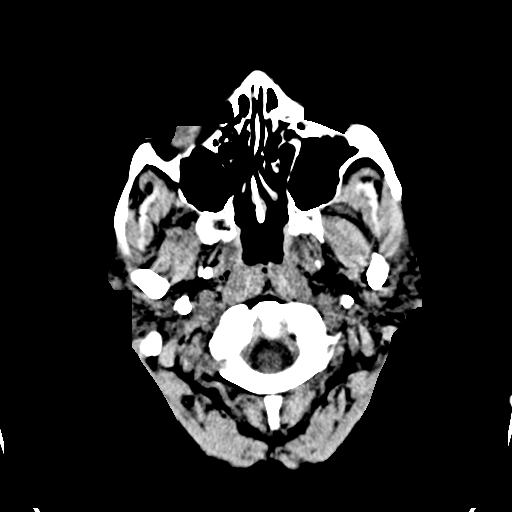
[im 2/32  bone]
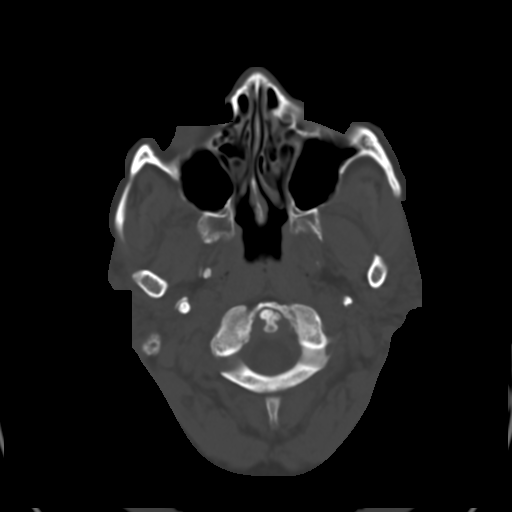
[im 4/32  brain]
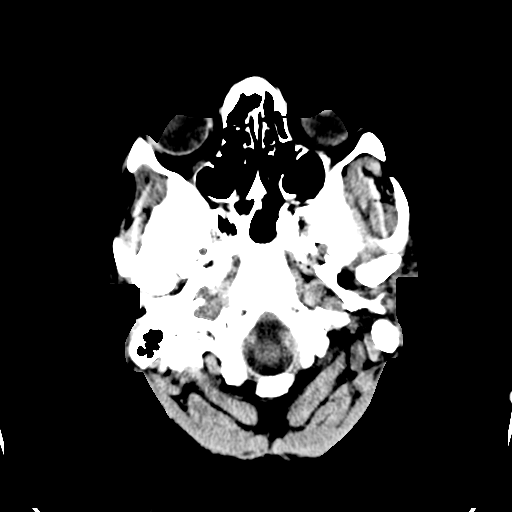
[im 6/32  brain]
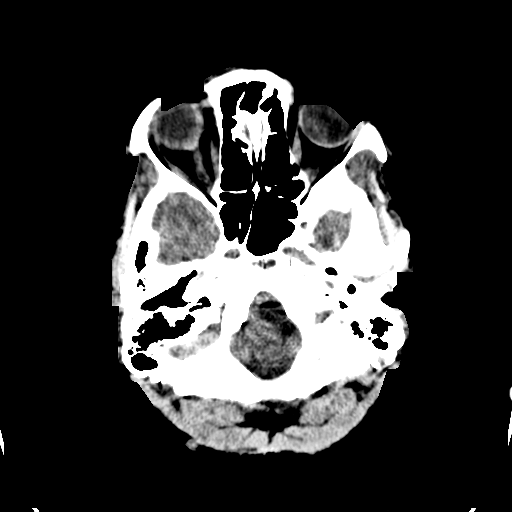
[im 8/32  brain]
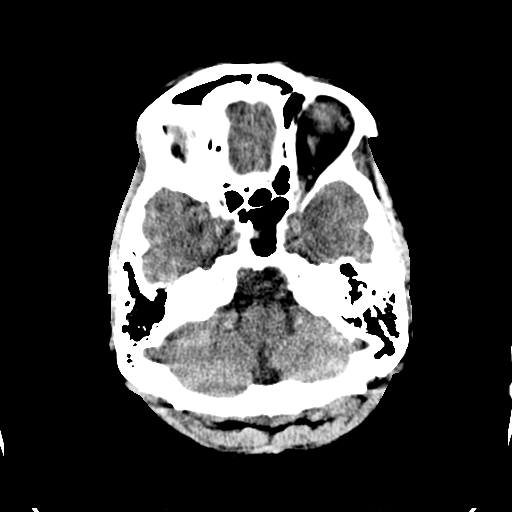
[im 9/32  brain]
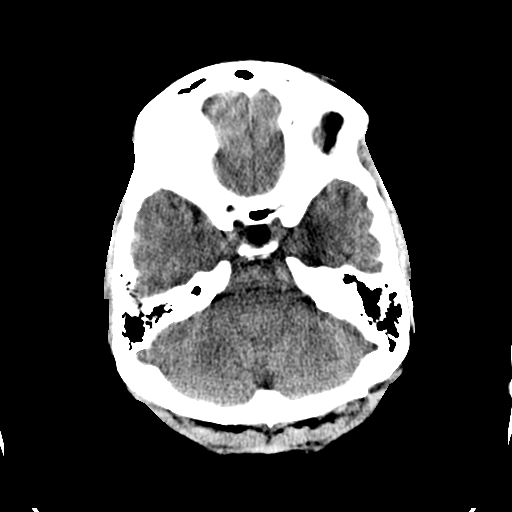
[im 9/32  bone]
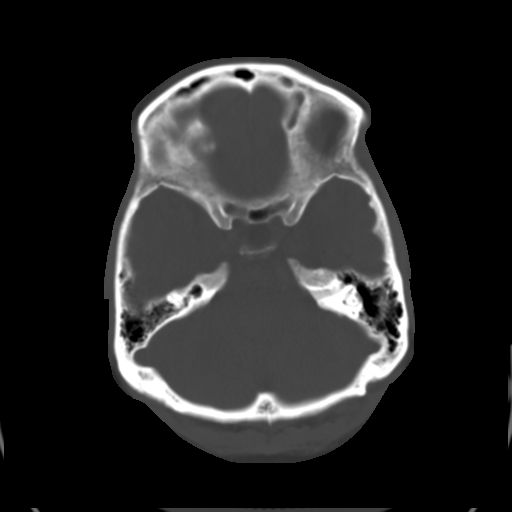
[im 11/32  brain]
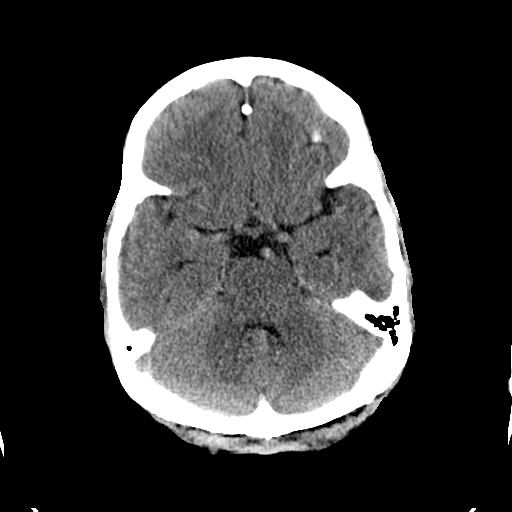
[im 13/32  brain]
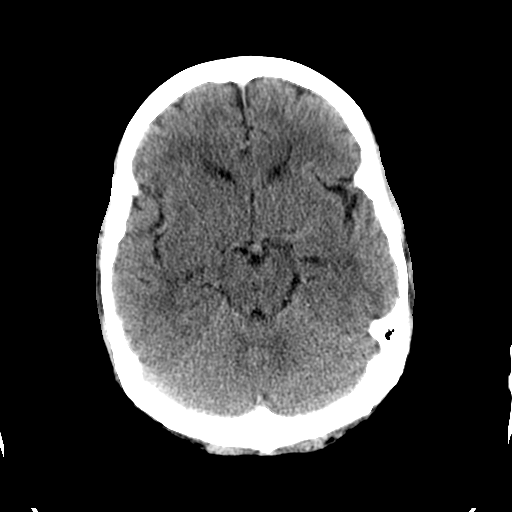
[im 15/32  brain]
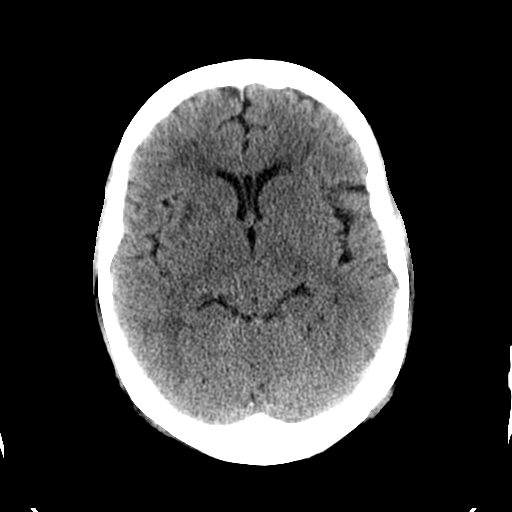
[im 17/32  brain]
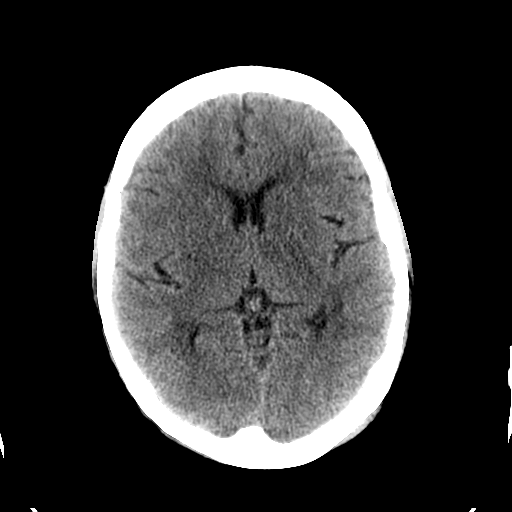
[im 17/32  bone]
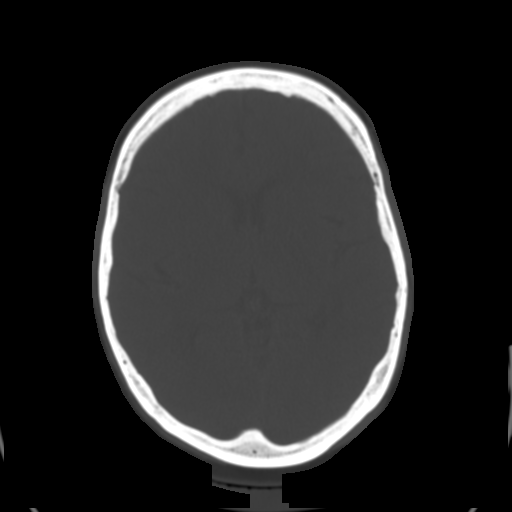
[im 19/32  brain]
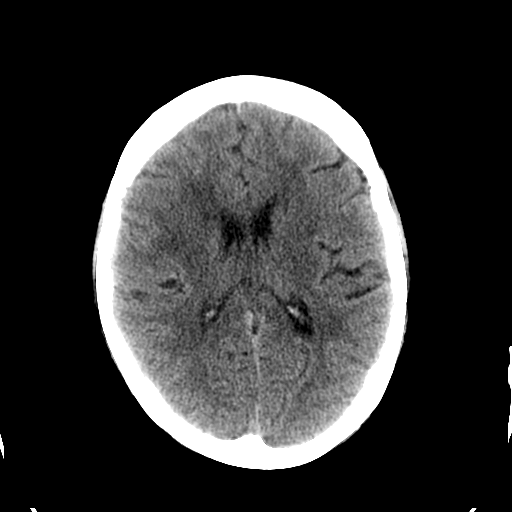
[im 21/32  brain]
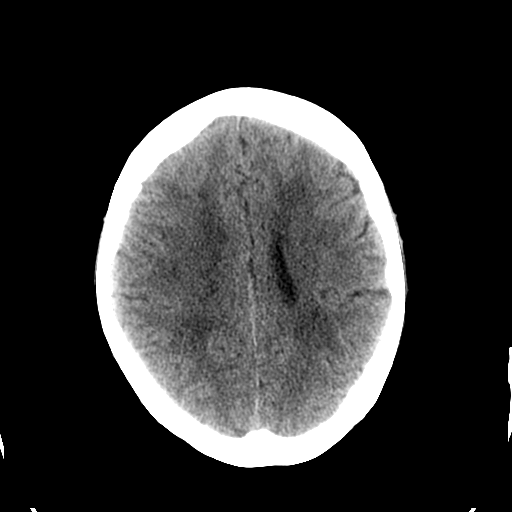
[im 23/32  brain]
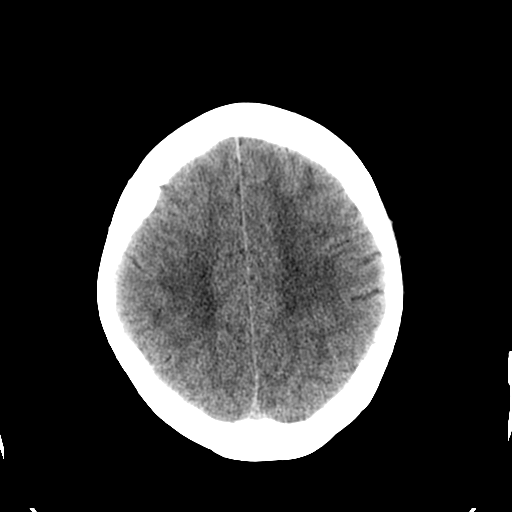
[im 24/32  brain]
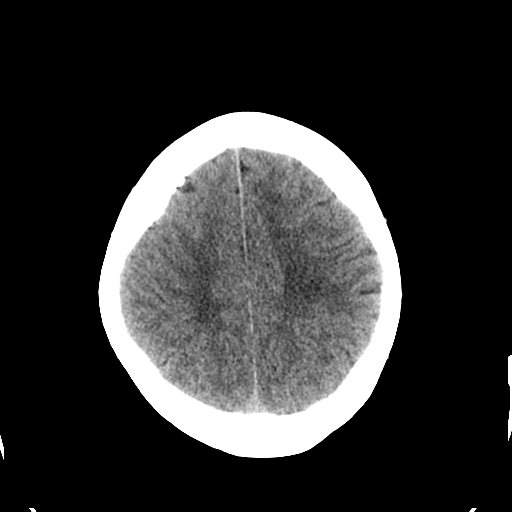
[im 24/32  bone]
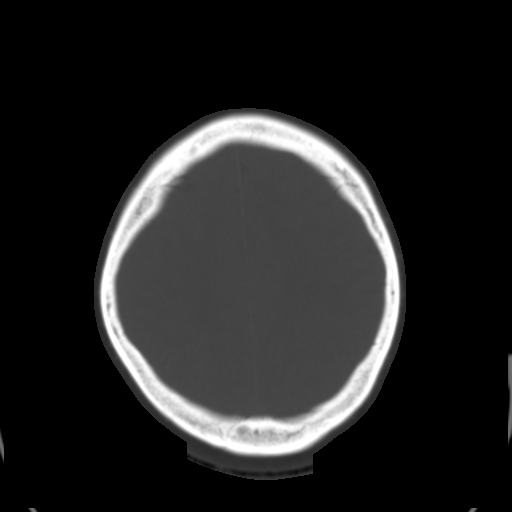
[im 26/32  brain]
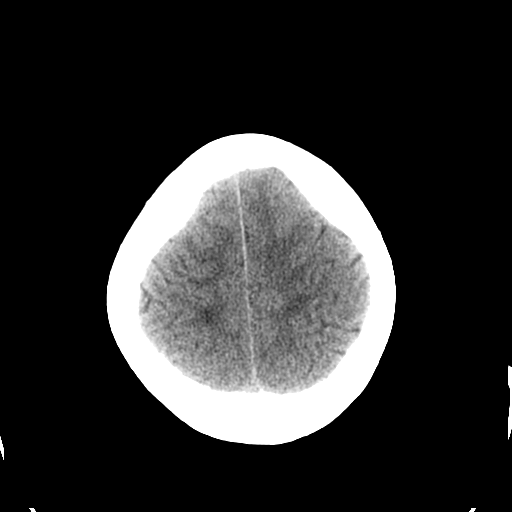
[im 28/32  brain]
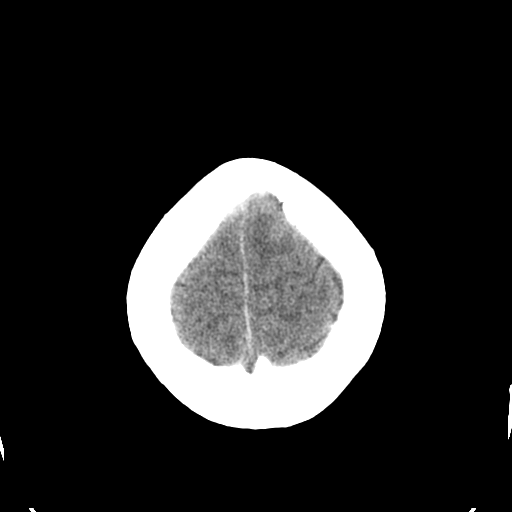
[im 30/32  brain]
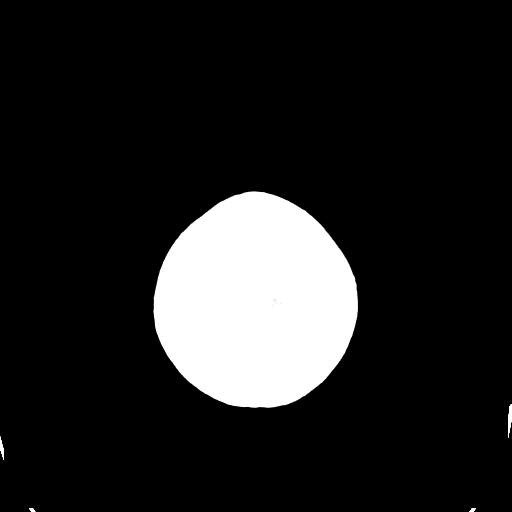

[16 of 30 positions shown; findings below may reference images not displayed]

FINDINGS: There is no evidence intracranial hemorrhage, cerebral edema, or mass effect.  Ventricles are normal in size.  No visible infarction.  Bone windows are unremarkable.
IMPRESSION: No acute findings.

## 2008-10-05 ENCOUNTER — Ambulatory Visit: Payer: Self-pay | Admitting: Family Medicine

## 2008-11-21 ENCOUNTER — Ambulatory Visit: Payer: Self-pay | Admitting: Family Medicine

## 2008-11-21 LAB — CONVERTED CEMR LAB
BUN: 48 mg/dL — ABNORMAL HIGH (ref 6–23)
CO2: 26 meq/L (ref 19–32)
Calcium: 10.1 mg/dL (ref 8.4–10.5)
Chloride: 100 meq/L (ref 96–112)
Creatinine, Ser: 1.78 mg/dL — ABNORMAL HIGH (ref 0.40–1.20)
Glucose, Bld: 145 mg/dL — ABNORMAL HIGH (ref 70–99)
Potassium: 4.4 meq/L (ref 3.5–5.3)
Sodium: 141 meq/L (ref 135–145)

## 2008-11-30 ENCOUNTER — Ambulatory Visit (HOSPITAL_COMMUNITY): Admission: RE | Admit: 2008-11-30 | Discharge: 2008-11-30 | Payer: Self-pay | Admitting: Family Medicine

## 2008-12-04 IMAGING — CR DG KNEE 1-2V*R*
2 series · 2 of 2 positions shown · non-contrast
Comparison: none

CLINICAL DATA: Knee pain, no injury. 
 RIGHT KNEE - 2 VIEW:

[t knee ap right]
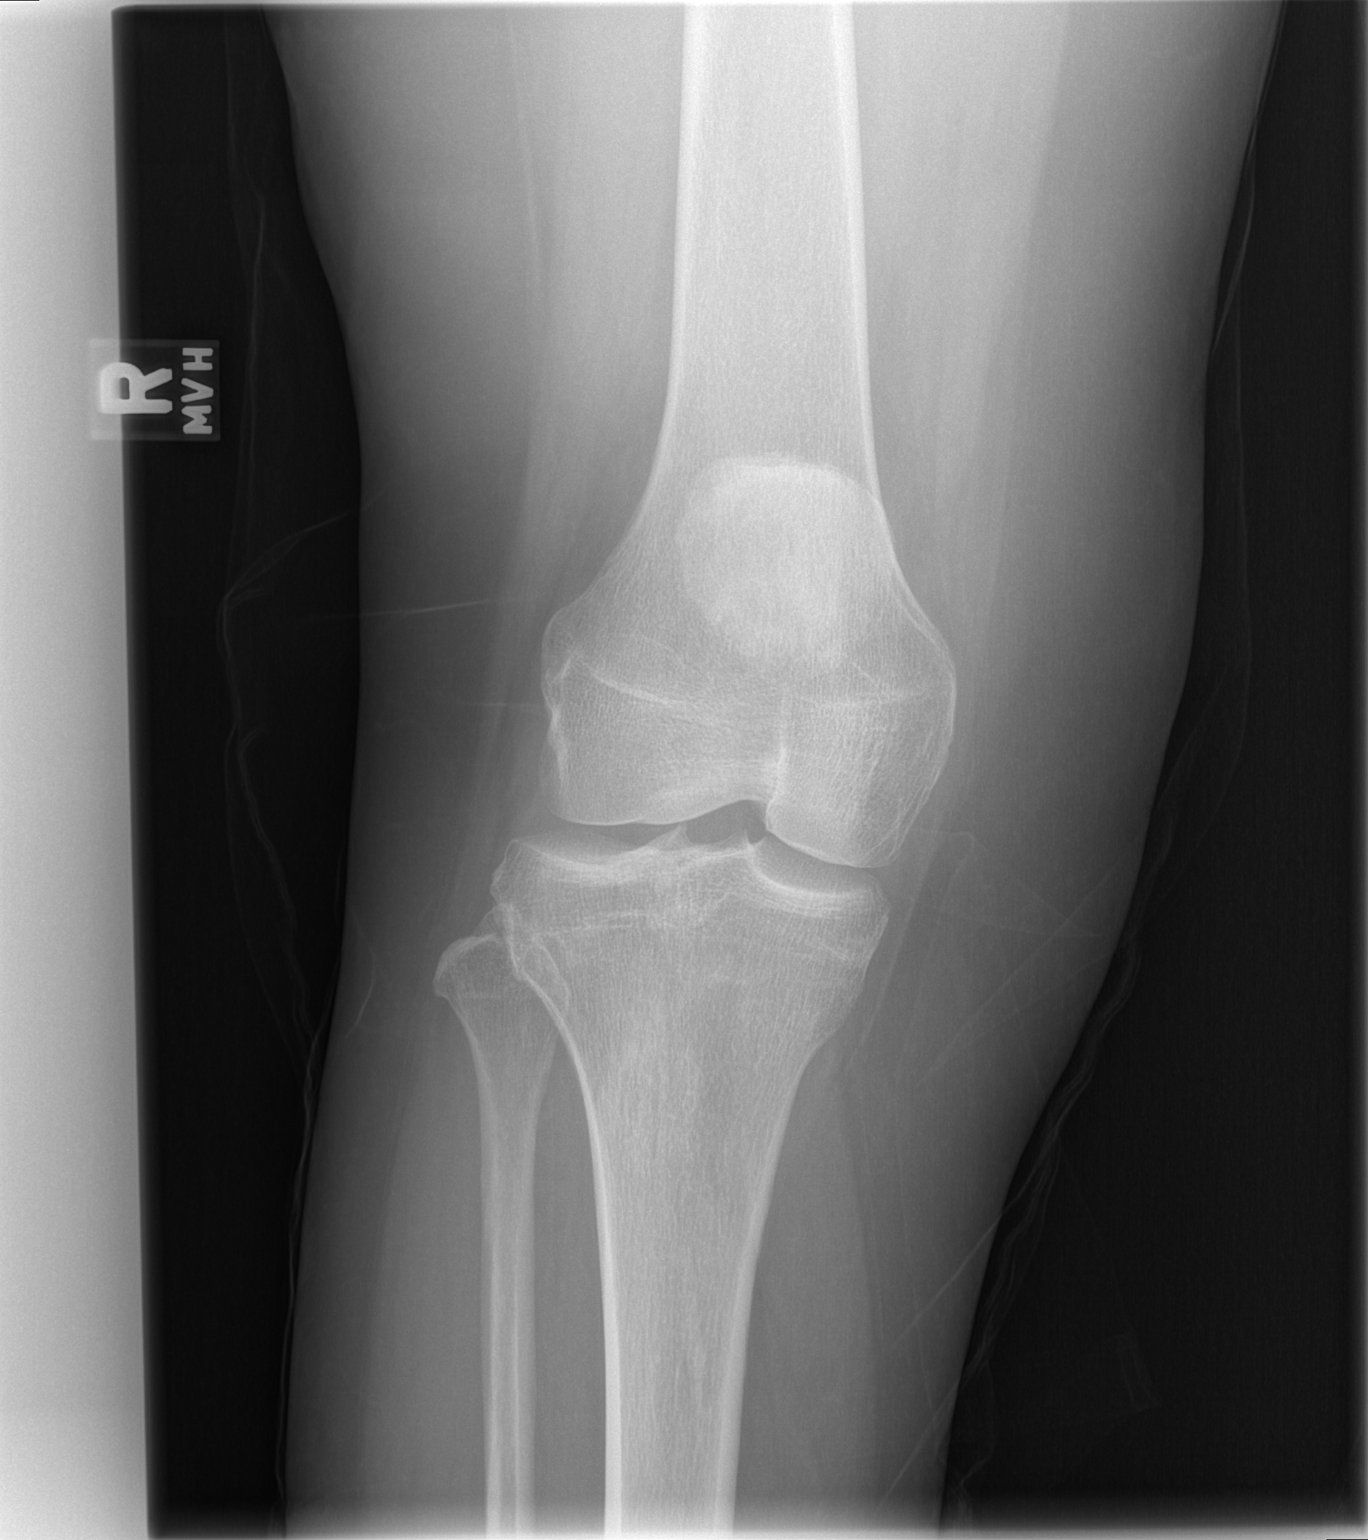

[t knee lat right]
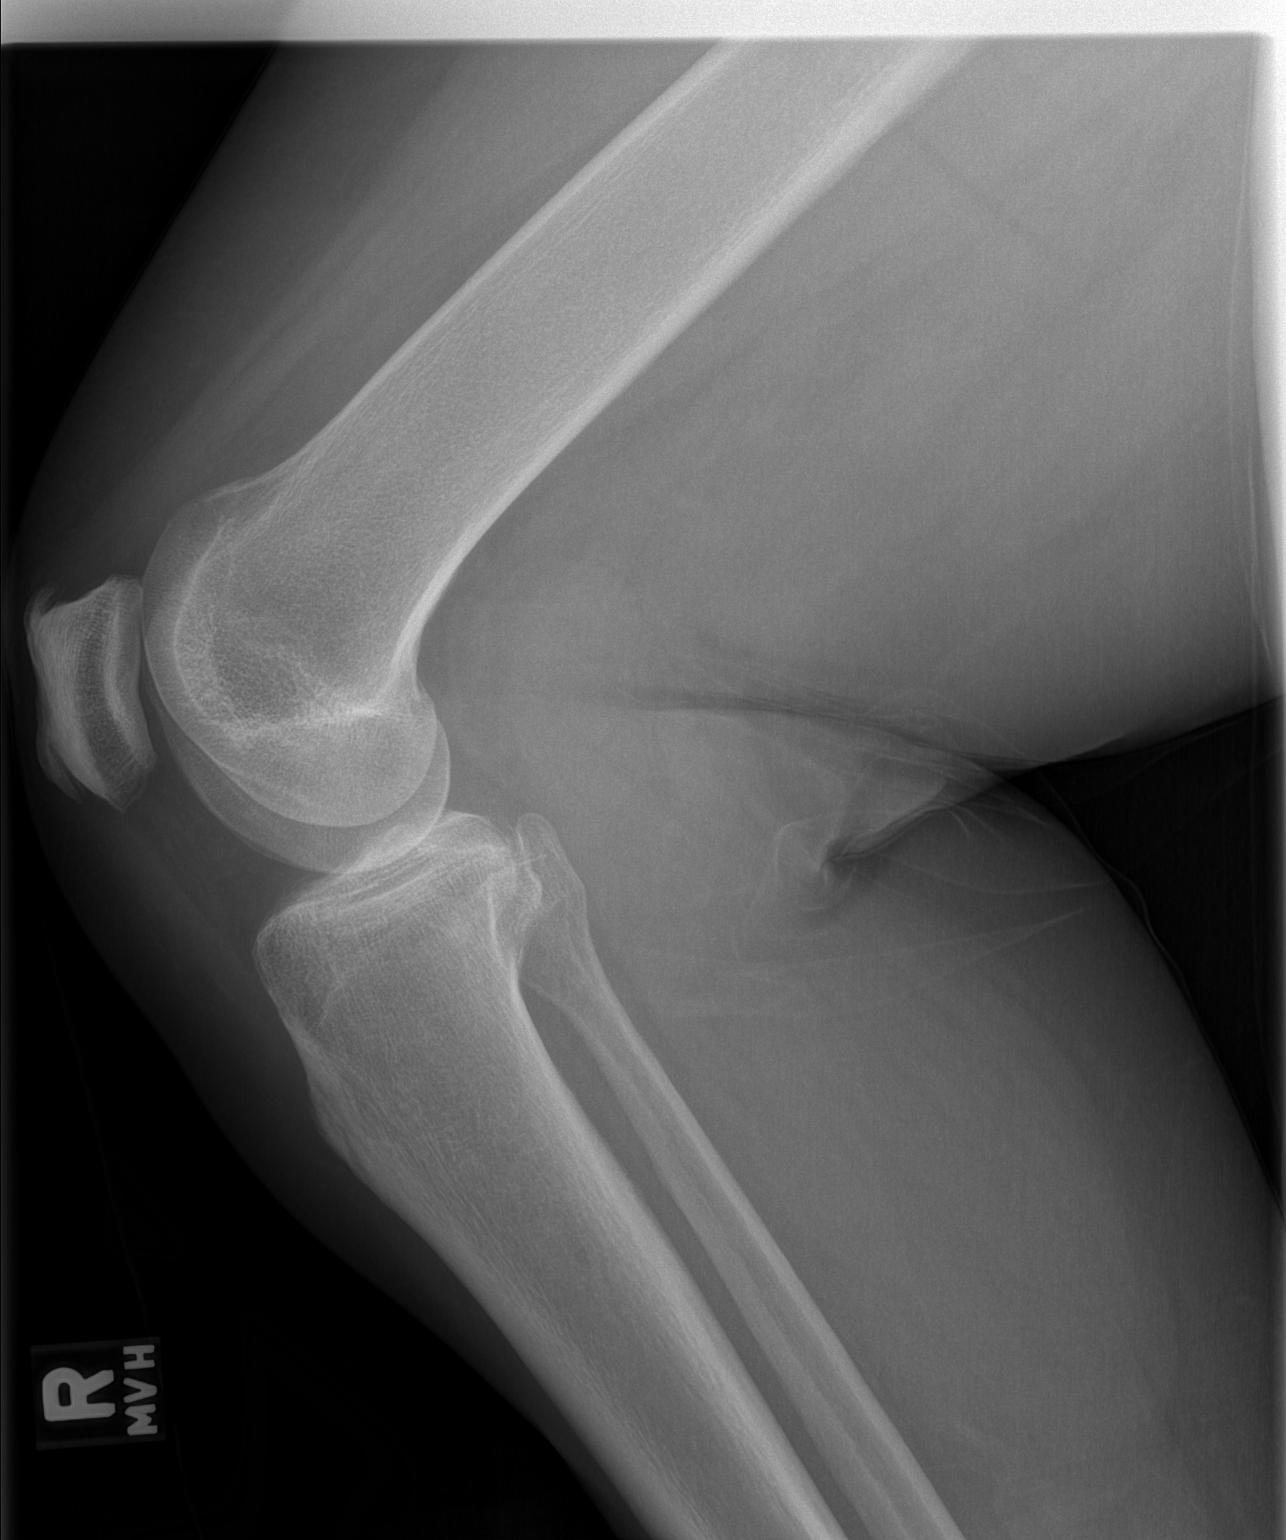

[2 of 2 positions shown; findings below may reference images not displayed]

FINDINGS: No joint effusion is seen.   Joint space heights are normal.   No significant finding.
IMPRESSION: Negative.

## 2008-12-07 ENCOUNTER — Emergency Department (HOSPITAL_COMMUNITY): Admission: EM | Admit: 2008-12-07 | Discharge: 2008-12-07 | Payer: Self-pay | Admitting: Emergency Medicine

## 2008-12-18 ENCOUNTER — Emergency Department (HOSPITAL_COMMUNITY): Admission: EM | Admit: 2008-12-18 | Discharge: 2008-12-18 | Payer: Self-pay | Admitting: Emergency Medicine

## 2008-12-21 ENCOUNTER — Ambulatory Visit: Payer: Self-pay | Admitting: Family Medicine

## 2008-12-23 IMAGING — CR DG HAND 3V BILAT
6 series · 6 of 6 positions shown · non-contrast
Comparison: none

CLINICAL DATA: 56 year old female with bilateral hand, wrist, and shoulder pain.  History of rheumatoid arthritis.
BILATERAL SHOULDERS ? 6 VIEWS - 05/25/07:

[x hand pa left]
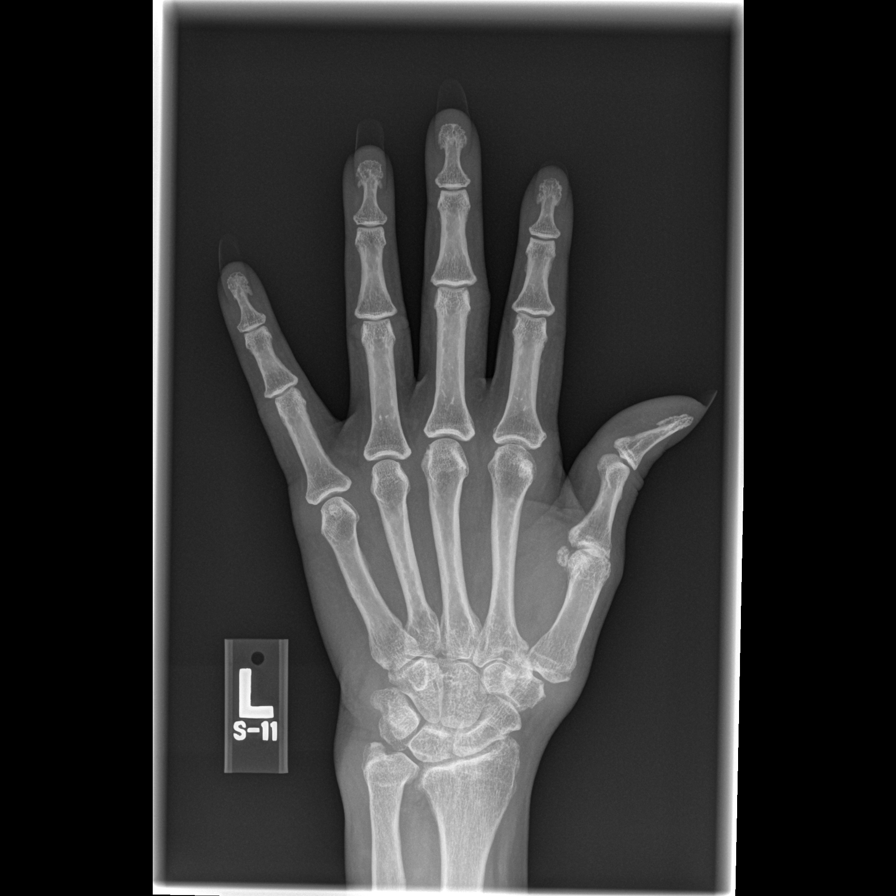

[x hand oblique left]
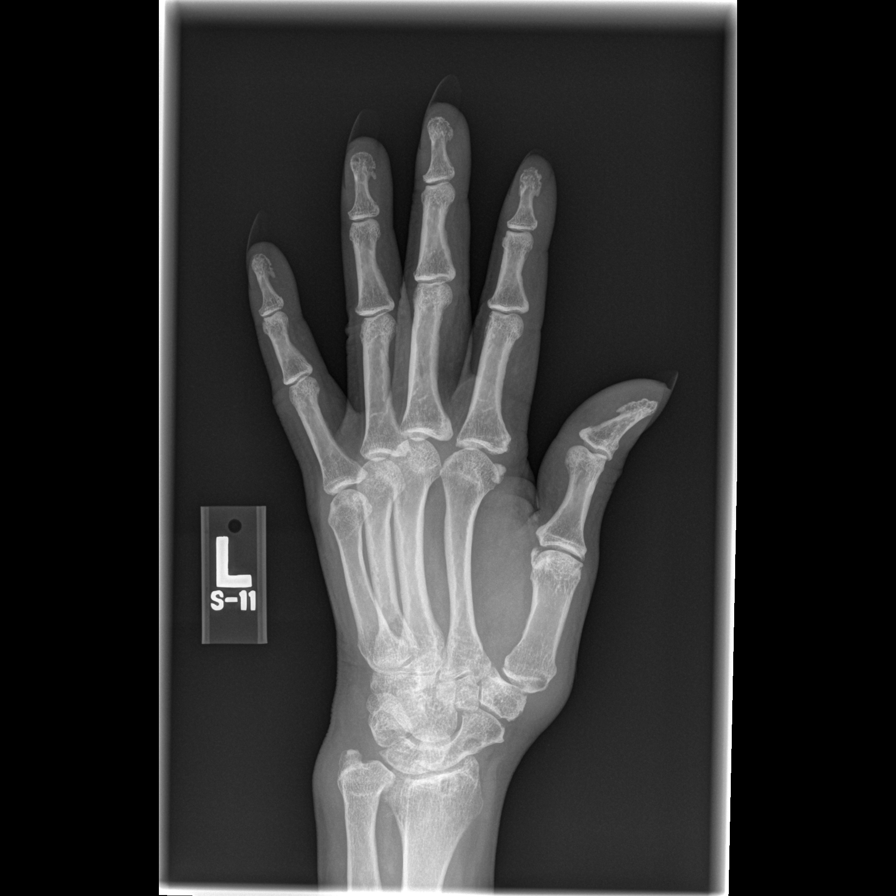

[x hand lat left]
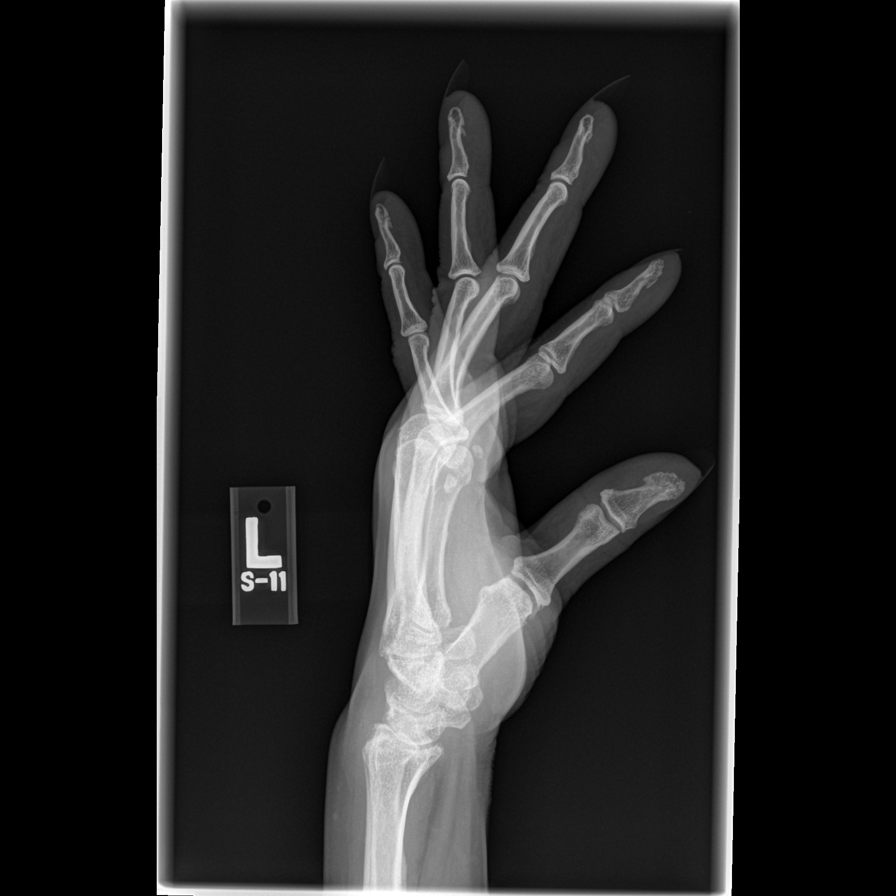

[x hand pa right]
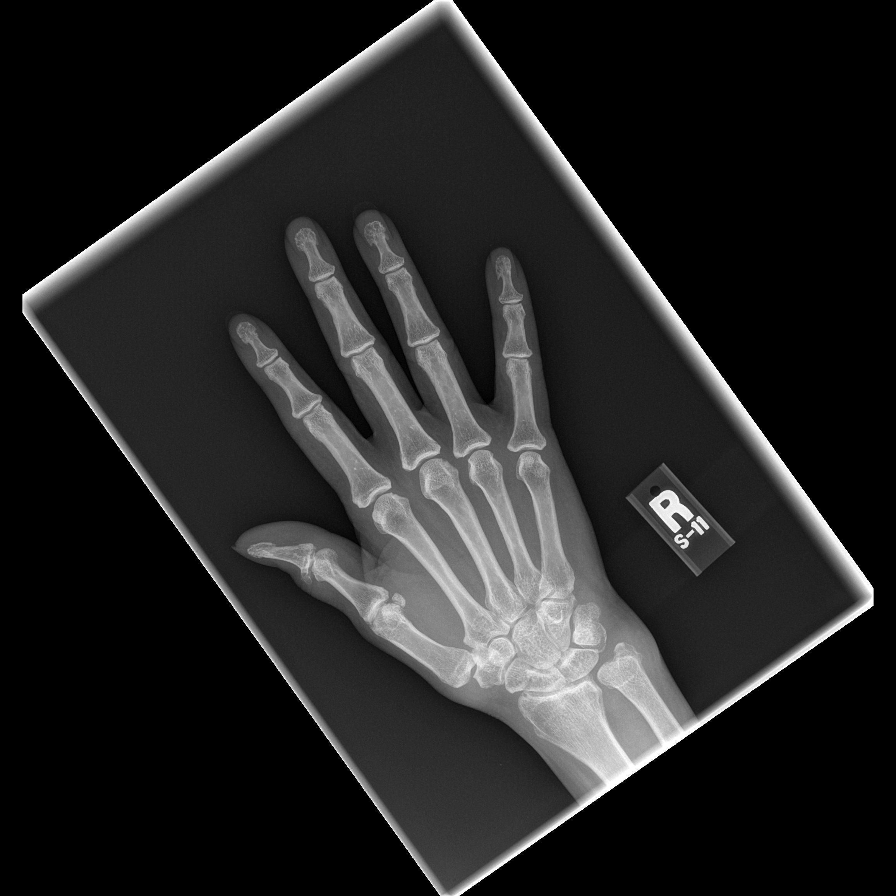

[x hand oblique right]
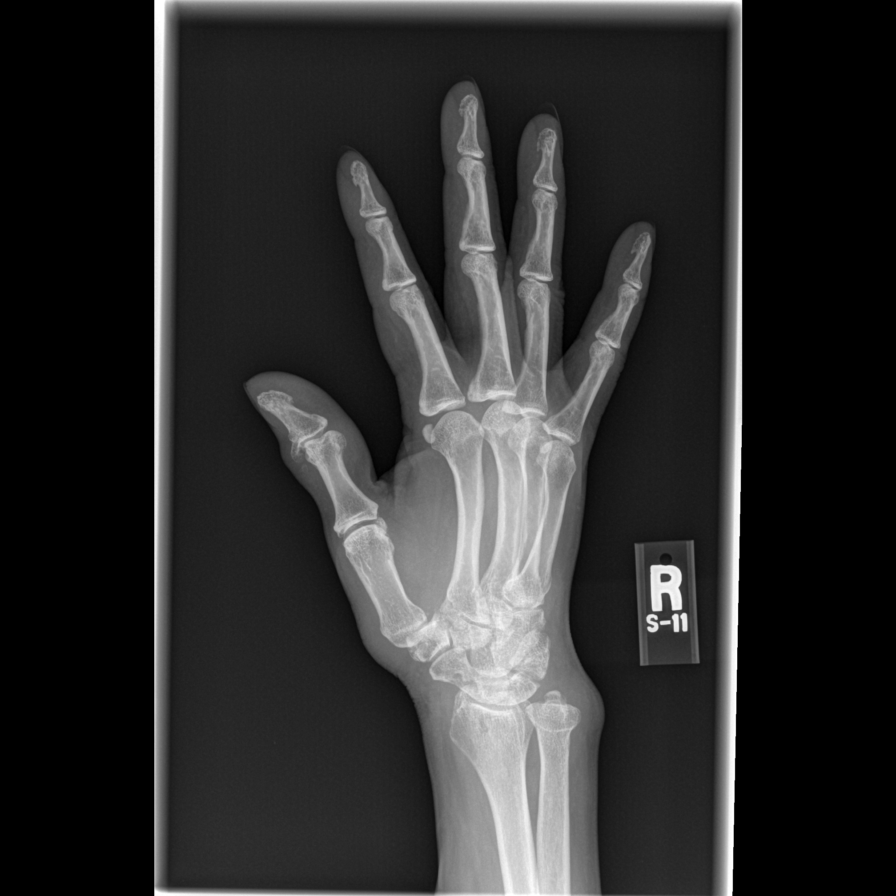

[x hand lat right]
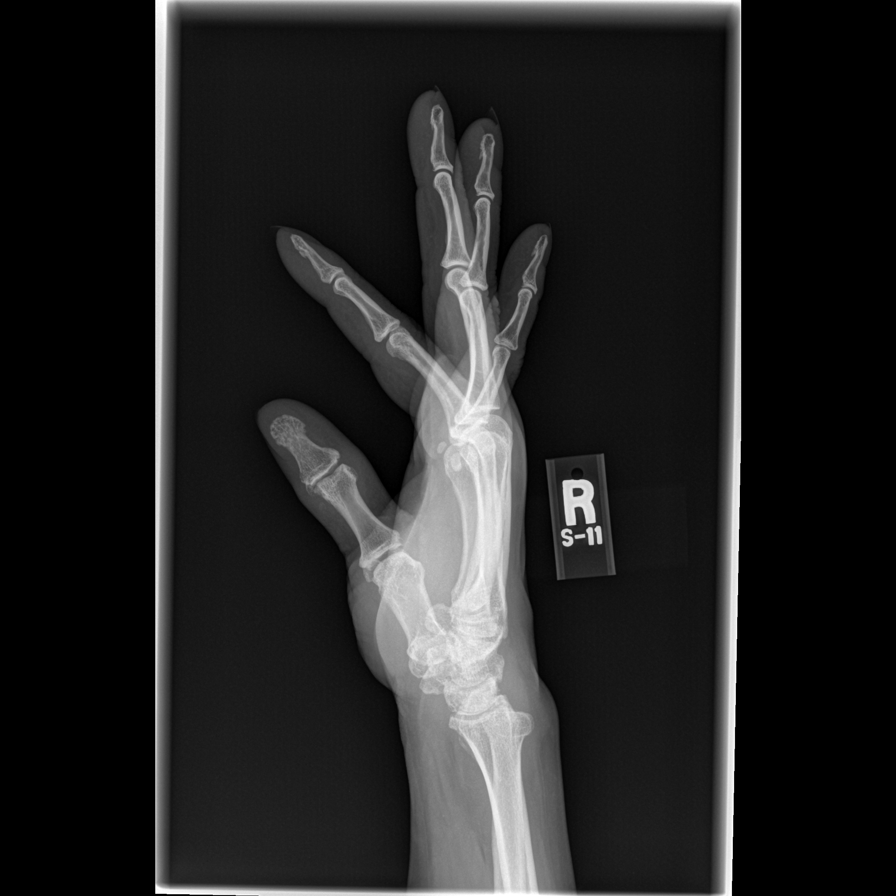

[6 of 6 positions shown; findings below may reference images not displayed]

FINDINGS: The right clavicle is intact. There is mild to moderate degenerative spurring about the right acromioclavicular joint.  No evidence of right glenohumeral joint dislocation.  The visualized proximal right humerus and scapula are intact.  The visualized right ribs are intact and the visualized right lung is clear.
There is mild degenerative spurring of the left acromioclavicular joint.  The left clavicle appears intact.  No glenohumeral joint dislocation. The left scapula, visualized left ribs, and left lung parenchyma are within normal limits.  The visualized proximal left humerus is intact.
IMPRESSION: Mild right greater than left acromioclavicular joint degenerative findings.   No acute fracture, dislocation, or significant glenohumeral joint arthropathy.

BILATERAL WRISTS ? 6 VIEWS - 05/25/07:
FINDINGS: The visualized distal left radius and ulna are intact. There is mild generalized soft tissue swelling about the left wrist.  Subchondral cyst or geode formation is noted involving the distal left radius and scaphoid.  There are juxta-articular erosions at the base of the fifth metacarpal and hamate.  The ulnar styloid appears blunted which may be post-traumatic or due to osseous erosion.  The carpal joint spaces appear relatively preserved.
On the right, the carpal joint spaces appear preserved.  Probable degenerative geode formation is seen involving the scaphoid and distal right radius as on the opposite side.  A slight blunted appearance of the ulnar styloid is again noted with possible lateral ulnar styloid erosion.  Possible erosion at the base of the trapezium is also noted.
IMPRESSION: 1.  No acute fracture or dislocation.
2.  Periarticular erosions involving the left fifth carpometacarpal joint, right greater than left ulnar styloid and possibly the base of the right trapezium are compatible with sequela of rheumatoid arthritis.

BILATERAL HANDS ? 6 VIEWS - 05/25/07:
FINDINGS: PA, lateral, and oblique views of both hands are provided.  Bone mineralization in the left hand is within normal limits.  The joint spaces are preserved. There is a periarticular erosion involving the third MCP joint, ulnar aspect of the third metacarpal head.  These views also demonstrate to better advantage a periarticular erosion involving the left trapezium-scaphoid articulation.  No other definite osseous erosion is seen.  
On the right, small periarticular erosions are noted involving the first and second metacarpal-phalangeal joints.  Bone mineralization is within normal limits.  Joint spaces appear preserved except at the thumb interphalangeal joint where degenerative spurring suggests osteoarthritis.  No acute fracture or dislocation.  No additional periarticular erosions.
IMPRESSION: Bilateral metacarpal-phalangeal periarticular erosions compatible with sequela of rheumatoid arthritis.
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
## 2009-02-14 ENCOUNTER — Emergency Department (HOSPITAL_COMMUNITY): Admission: EM | Admit: 2009-02-14 | Discharge: 2009-02-14 | Payer: Self-pay | Admitting: Emergency Medicine

## 2009-02-20 ENCOUNTER — Emergency Department (HOSPITAL_COMMUNITY): Admission: EM | Admit: 2009-02-20 | Discharge: 2009-02-21 | Payer: Self-pay | Admitting: Emergency Medicine

## 2009-05-17 ENCOUNTER — Encounter: Payer: Self-pay | Admitting: Cardiovascular Disease

## 2009-10-22 IMAGING — CR DG CHEST 2V
2 series · 2 of 2 positions shown · non-contrast
Comparison: 01/08/2007

CLINICAL DATA: Dyspnea, cough, congestion

CHEST - 2 VIEW

[w chest pa *]
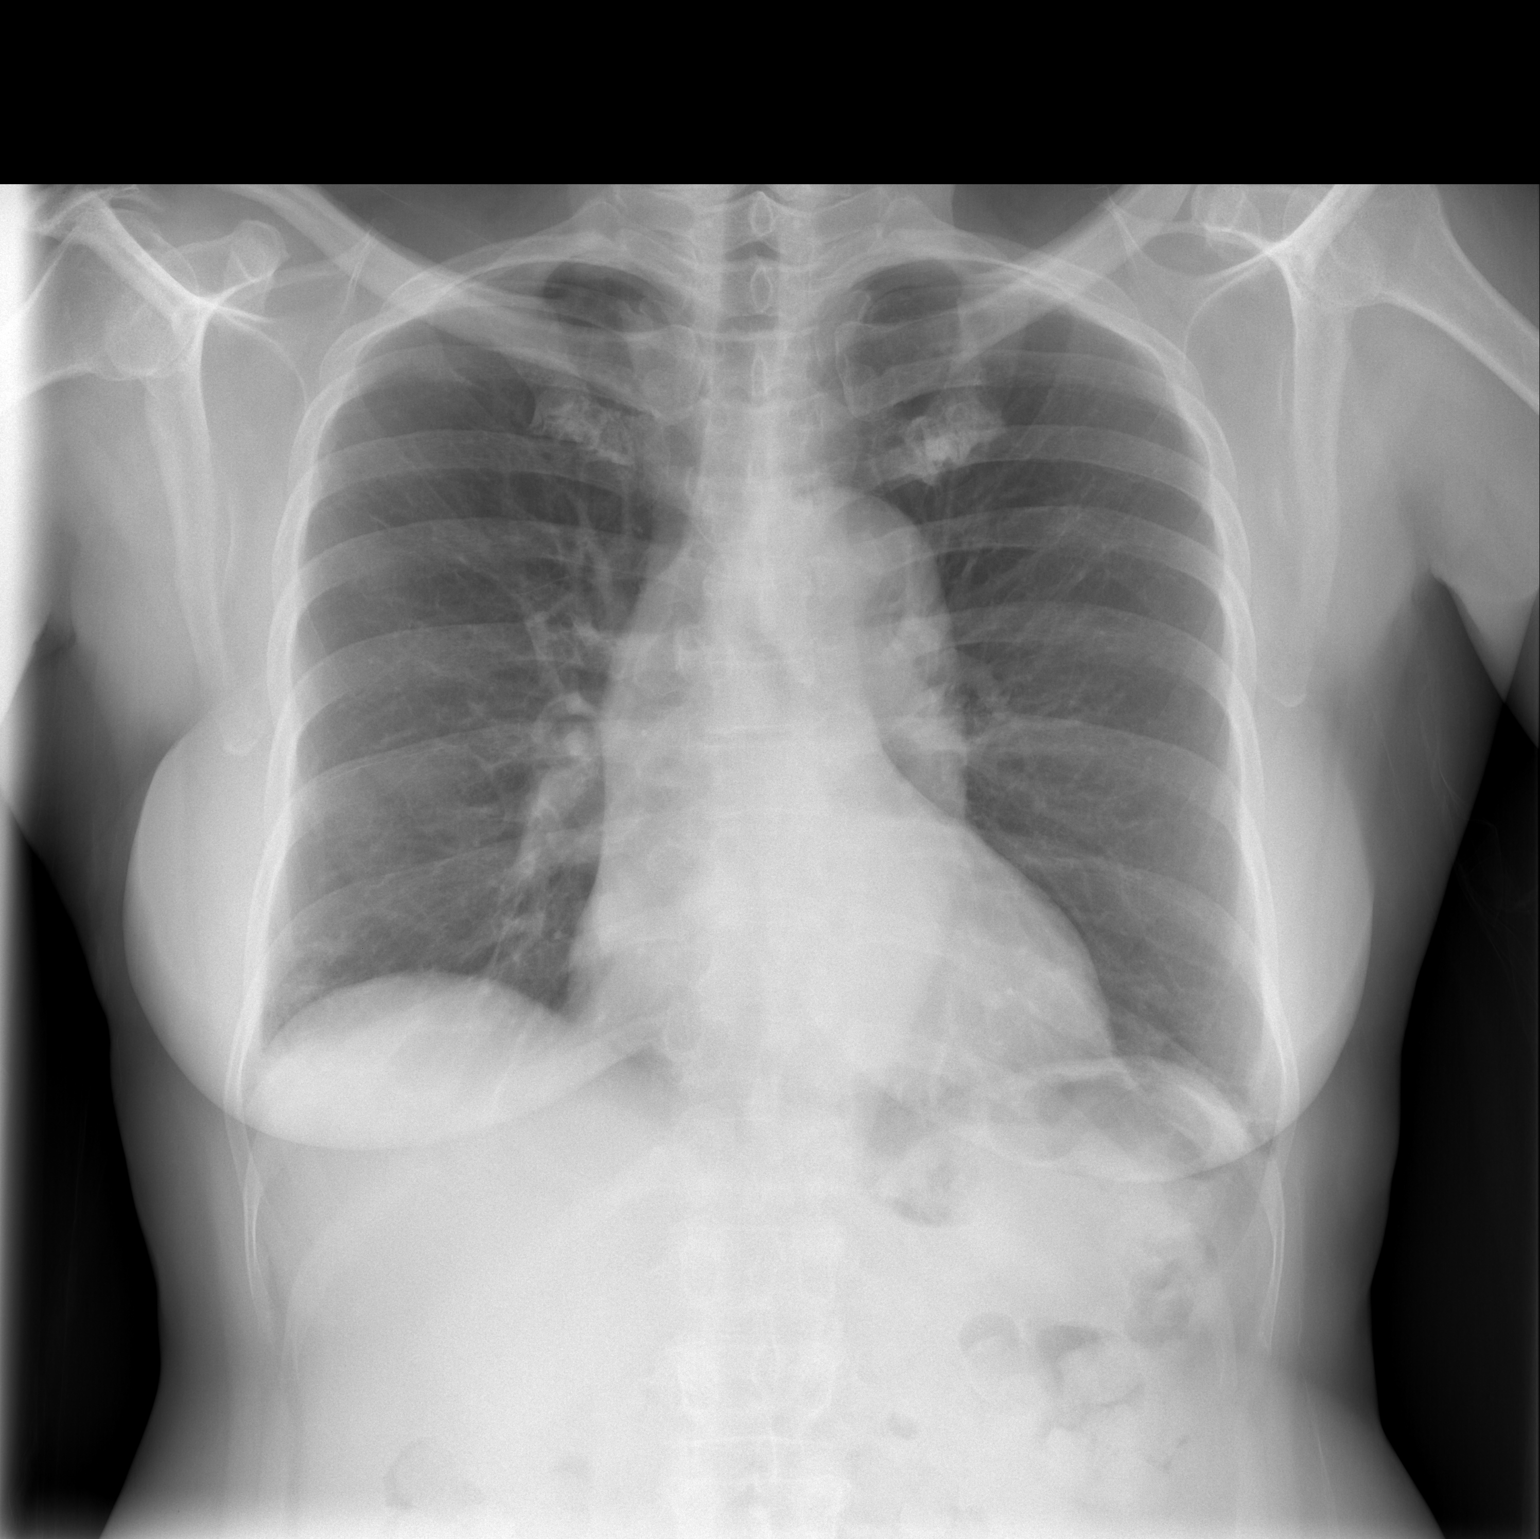

[w chest lat]
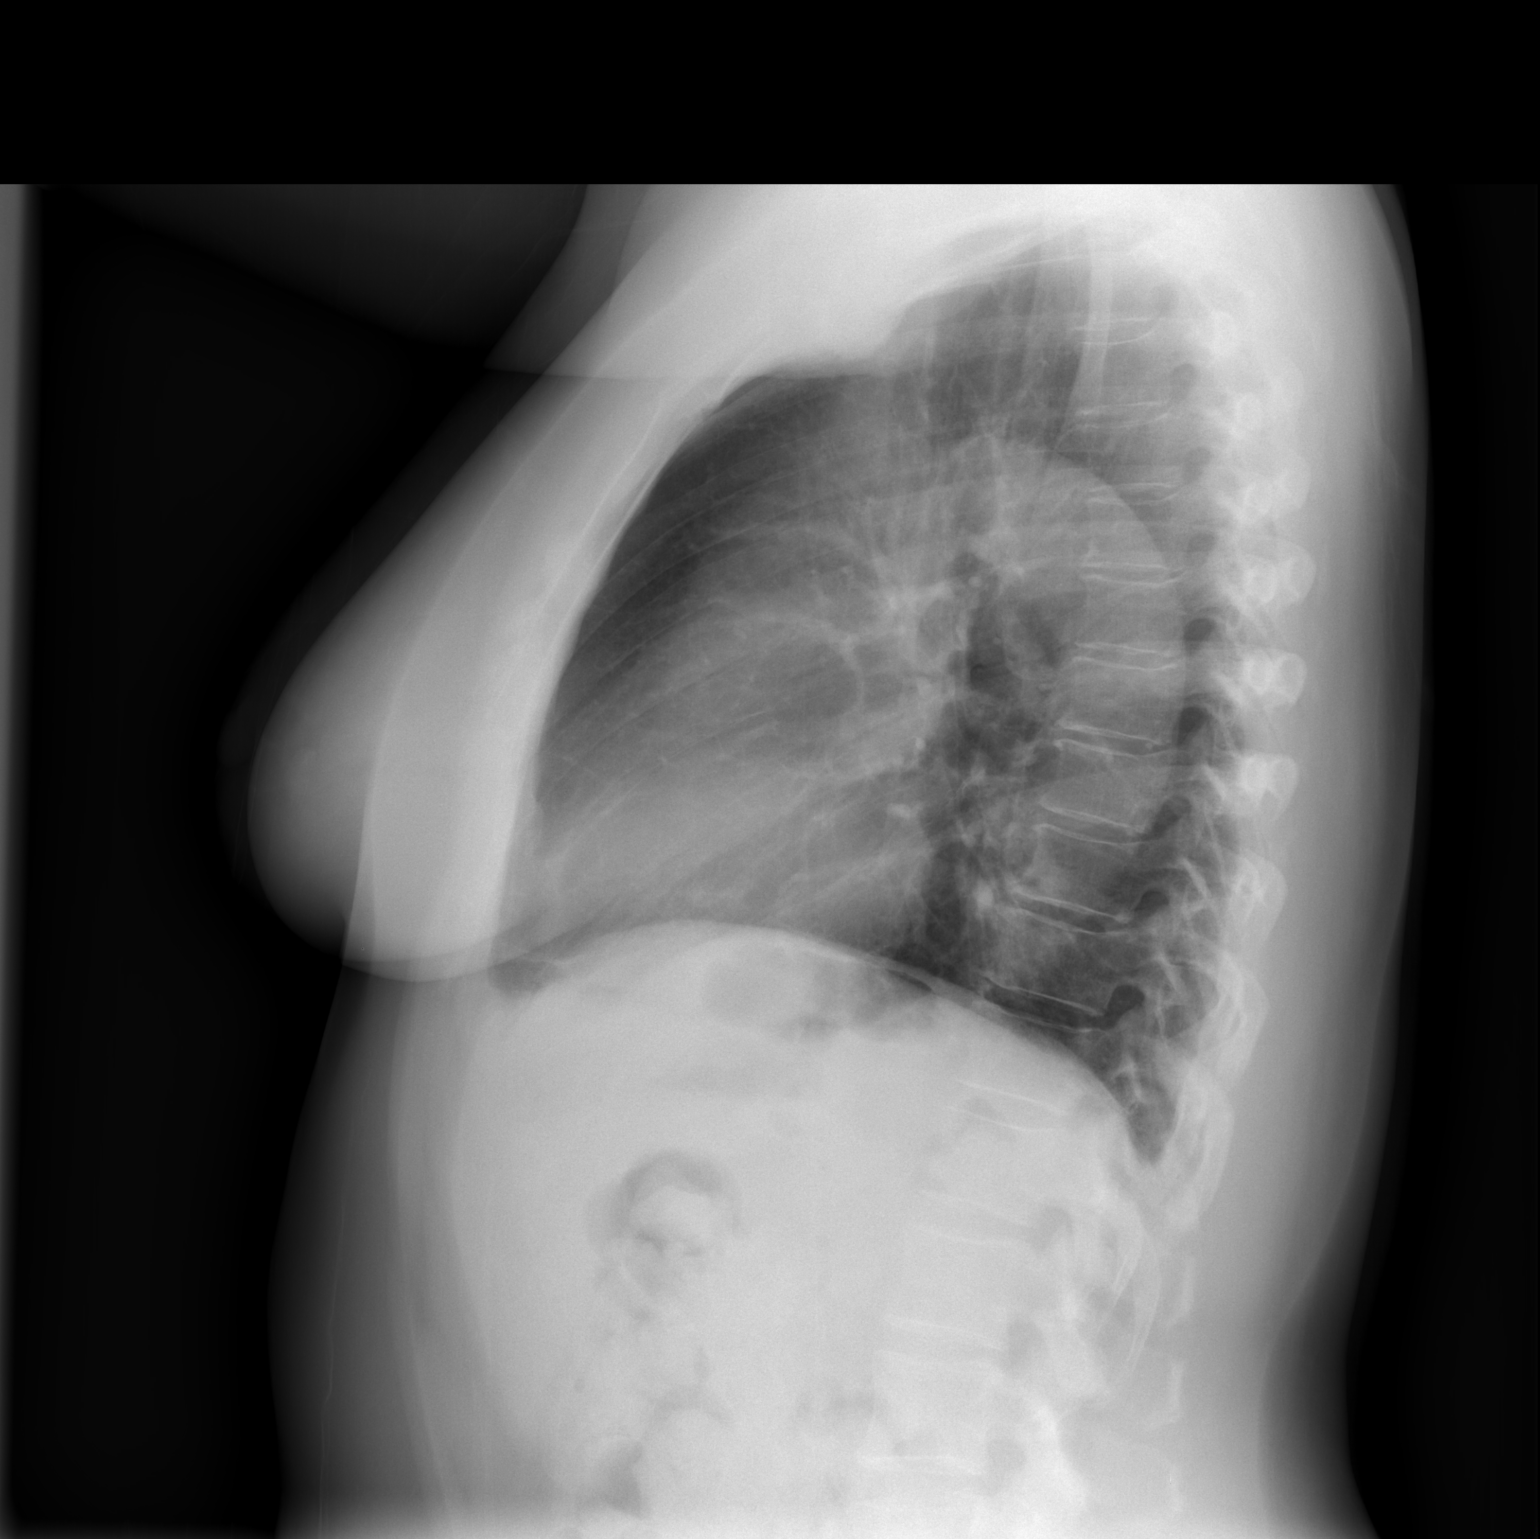

[2 of 2 positions shown; findings below may reference images not displayed]

FINDINGS: Borderline cardiac enlargement.
Elongation of thoracic aorta.
Pulmonary vascularity normal.
Minimal atelectasis right costophrenic angle.
Prominent first costochondral junctions bilaterally.
No pulmonary infiltrate, pleural effusion, or pneumothorax.
No acute bony abnormalities.
IMPRESSION: Minimal atelectasis right costophrenic angle.

## 2009-12-03 ENCOUNTER — Ambulatory Visit (HOSPITAL_COMMUNITY): Admission: RE | Admit: 2009-12-03 | Discharge: 2009-12-03 | Payer: Self-pay | Admitting: Family Medicine

## 2009-12-10 ENCOUNTER — Encounter: Admission: RE | Admit: 2009-12-10 | Discharge: 2009-12-10 | Payer: Self-pay | Admitting: Family Medicine

## 2010-06-24 ENCOUNTER — Emergency Department (HOSPITAL_COMMUNITY): Admission: EM | Admit: 2010-06-24 | Discharge: 2010-06-24 | Payer: Self-pay | Admitting: Emergency Medicine

## 2010-07-01 IMAGING — MG MM DIGITAL SCREENING BILAT
4 series · 4 of 4 positions shown · non-contrast
Comparison: none

DG SCREEN MAMMOGRAM BILATERAL
Bilateral CC and MLO view(s) were taken.
Technologist: Belen, Parvinder.(VILLA)(M)

DIGITAL SCREENING MAMMOGRAM WITH CAD:
There are scattered fibroglandular densities.  No masses or malignant type calcifications are 
identified.  Compared with prior studies.

[R CC]
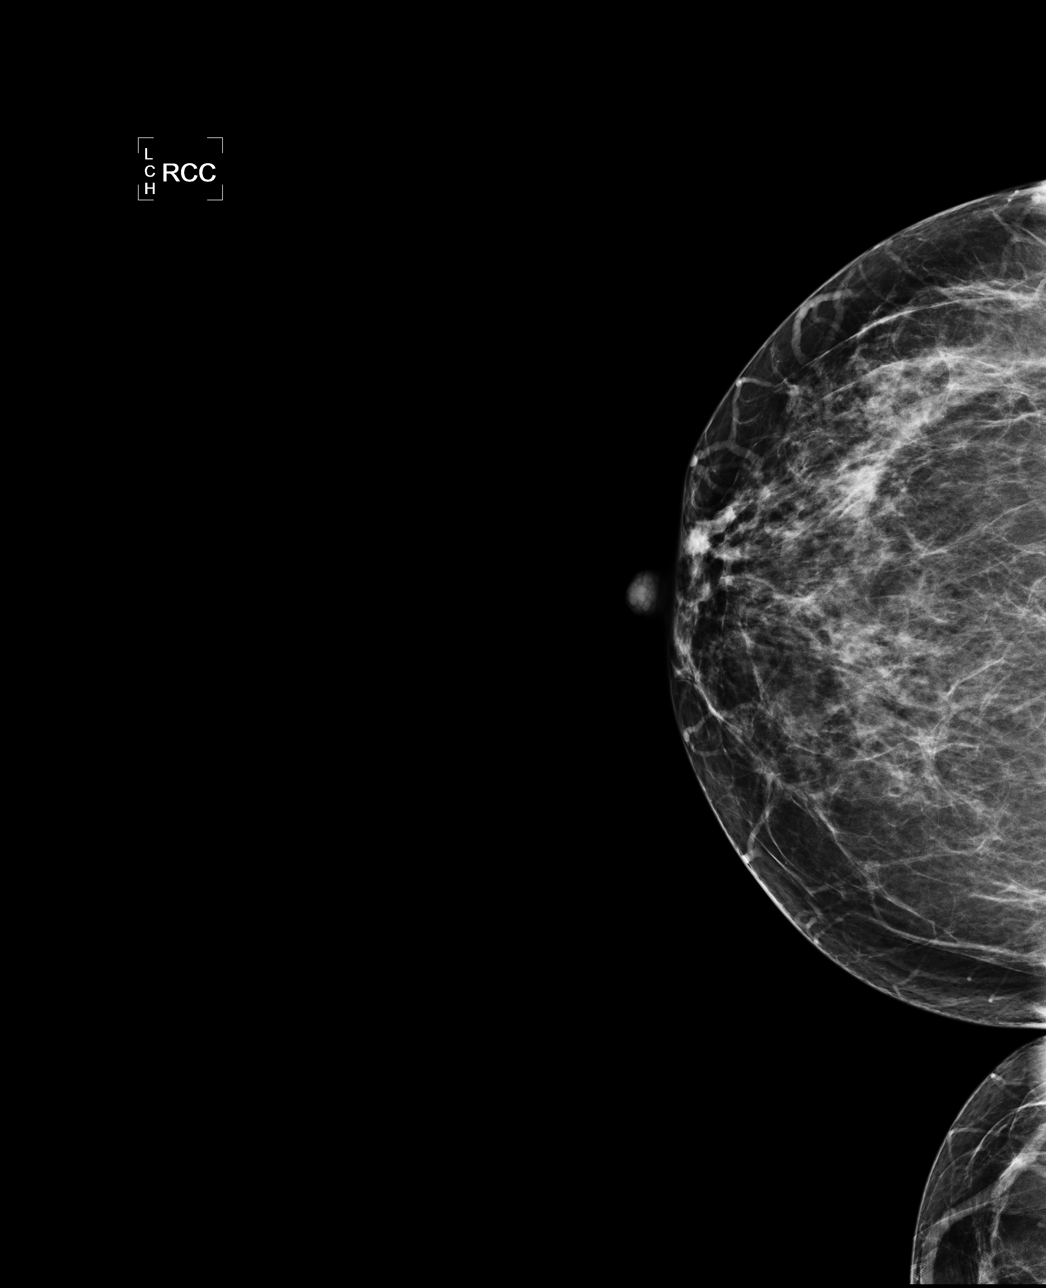

[R MLO]
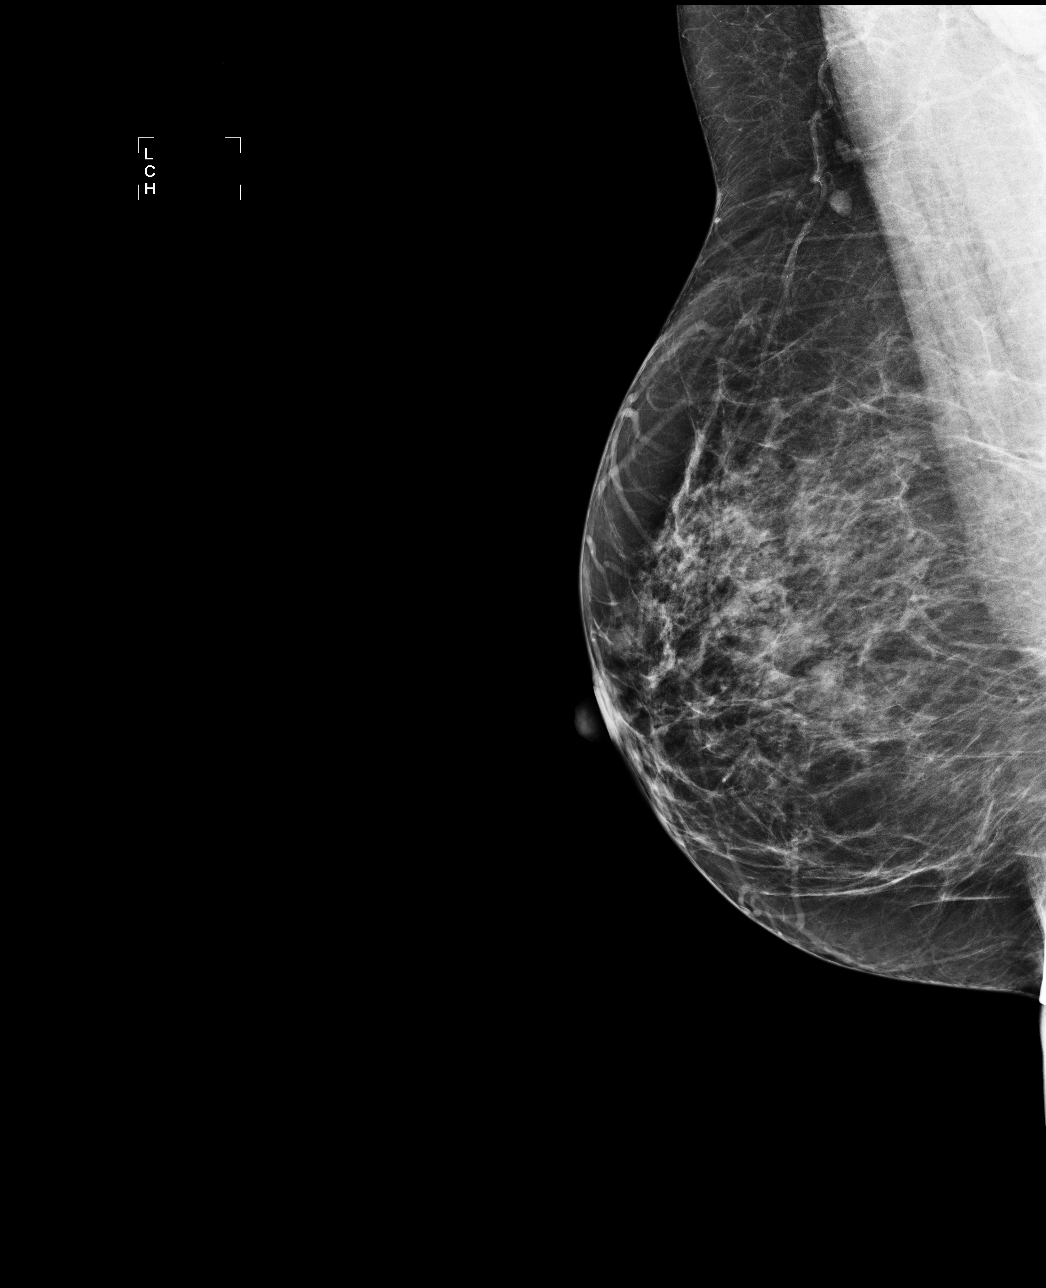

[L CC]
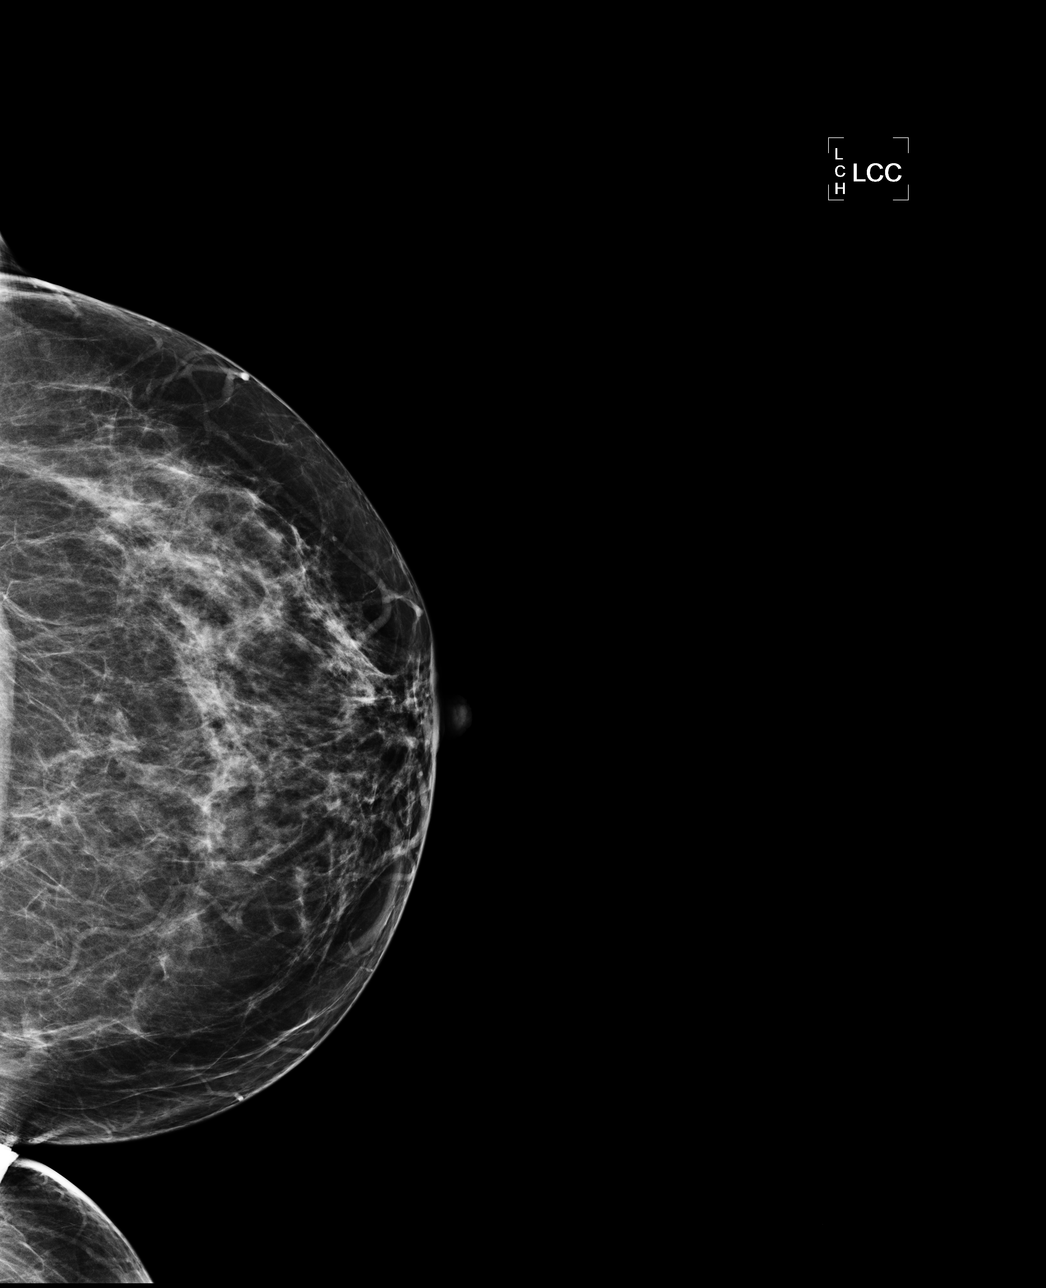

[L MLO]
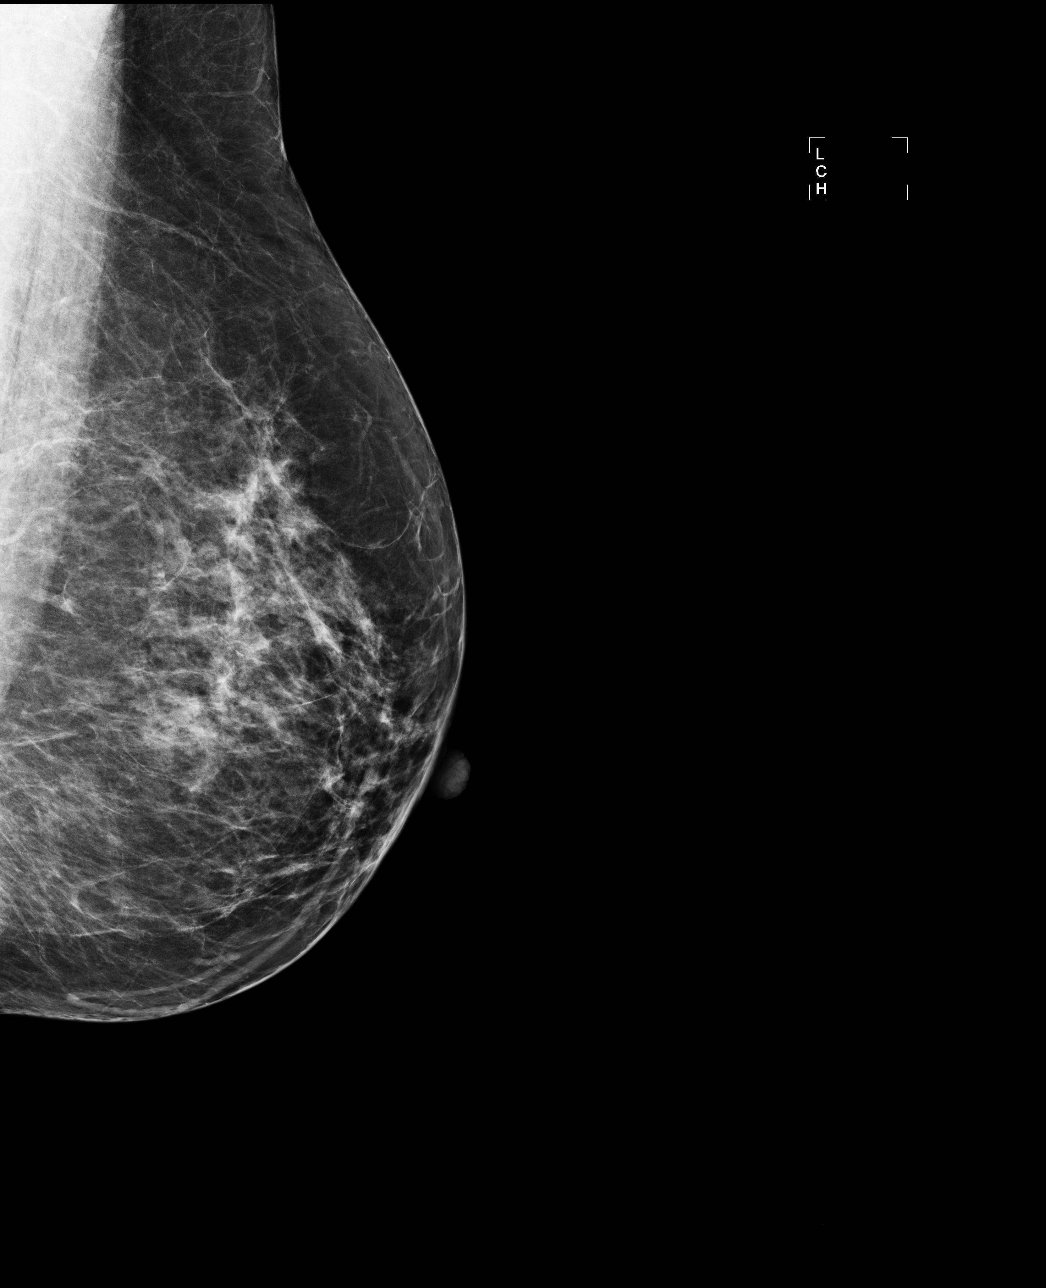

[4 of 4 positions shown; findings below may reference images not displayed]

IMPRESSION: No specific mammographic evidence of malignancy.  Next screening mammogram is recommended in one 
year.

ASSESSMENT: Negative - BI-RADS 1

Screening mammogram in 1 year.
ANALYZED BY COMPUTER AIDED DETECTION. , THIS PROCEDURE WAS A DIGITAL MAMMOGRAM.

## 2010-07-05 ENCOUNTER — Emergency Department (HOSPITAL_COMMUNITY): Admission: EM | Admit: 2010-07-05 | Discharge: 2010-07-05 | Payer: Self-pay | Admitting: Emergency Medicine
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
## 2010-09-21 IMAGING — CR DG RIBS W/ CHEST 3+V BILAT
4 series · 4 of 4 positions shown · non-contrast
Comparison: 02/14/2009

CLINICAL DATA: Mid chest pain

BILATERAL RIBS AND CHEST - 4+ VIEW

[w chest pa]
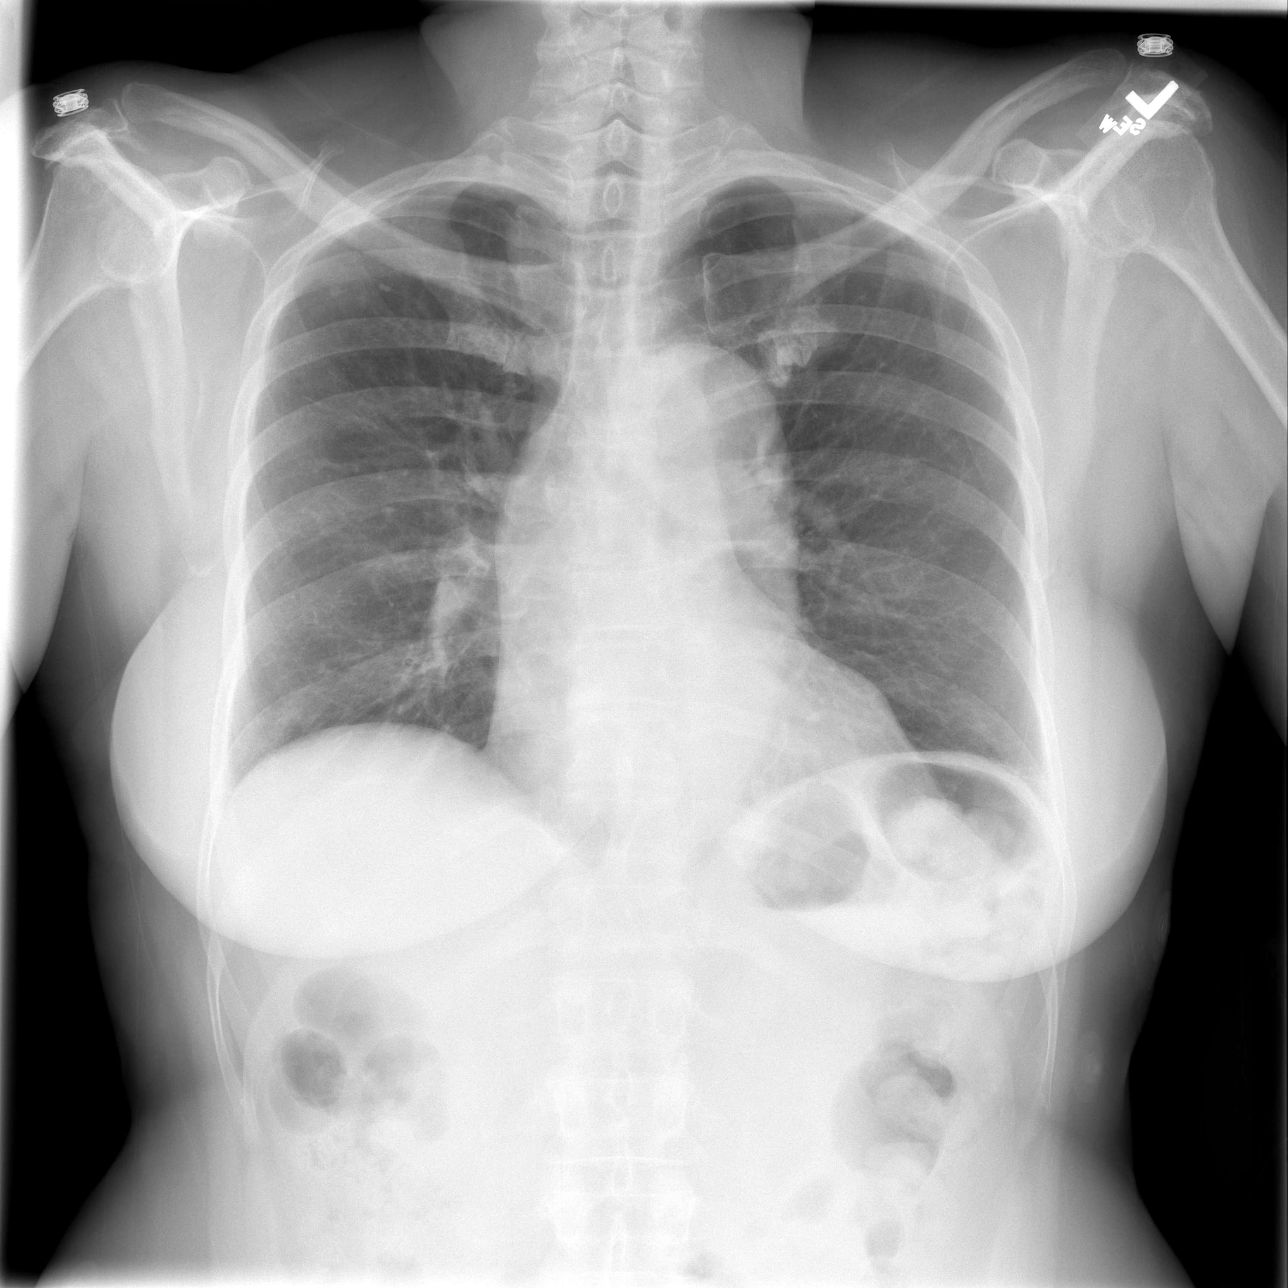

[w ribs ap/pa upper left]
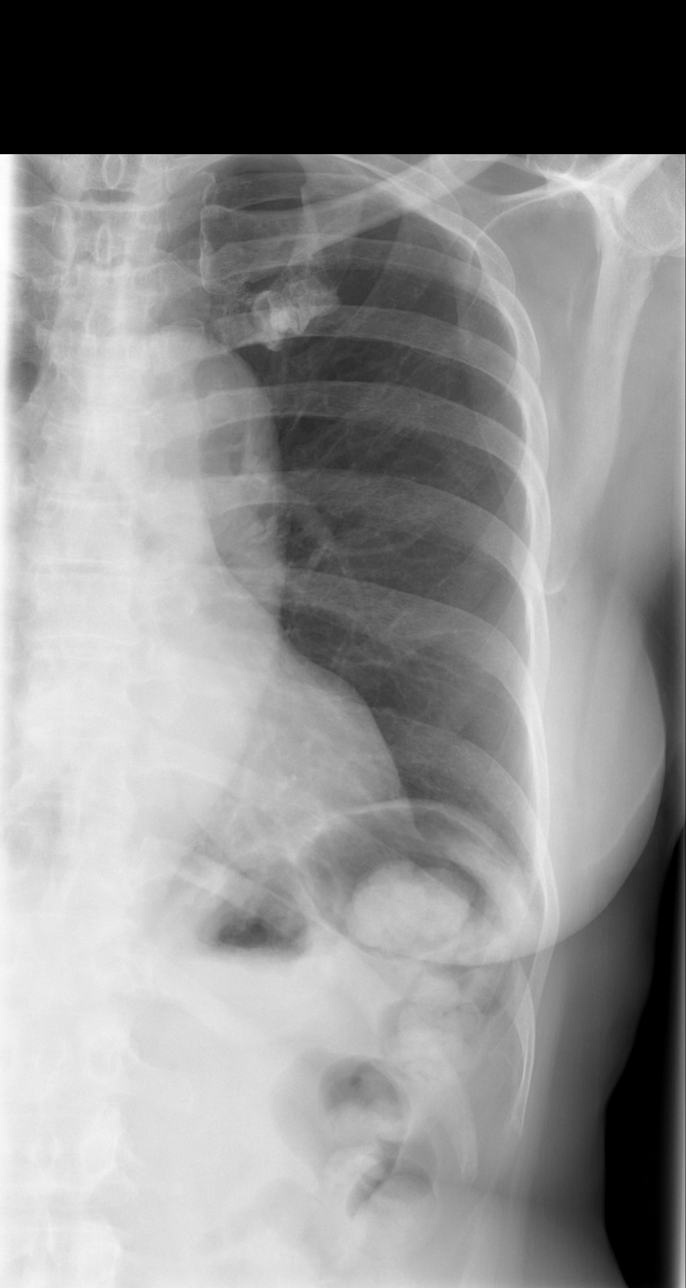

[w ribs oblique left]
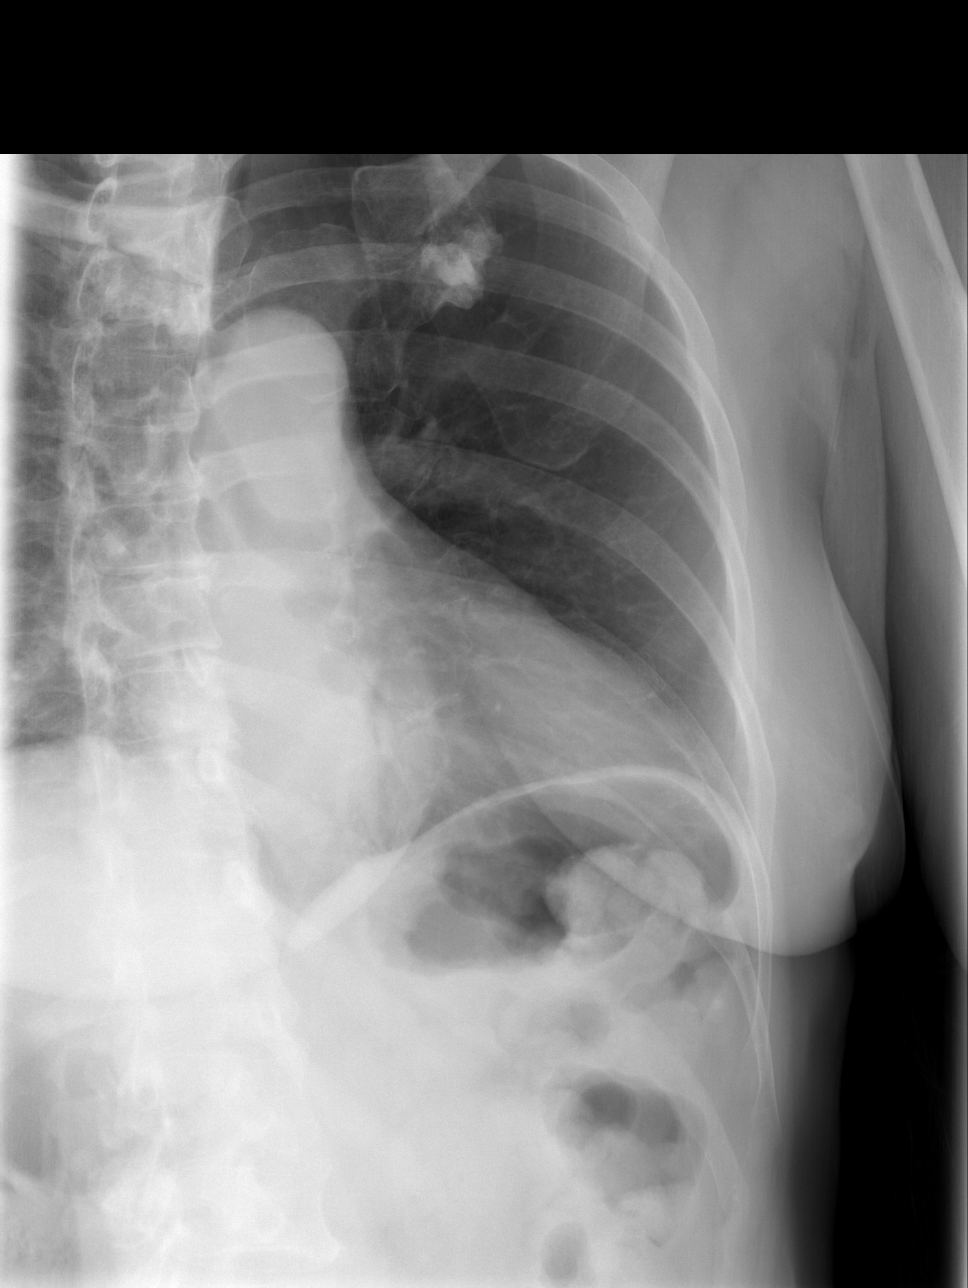

[w ribs oblique right]
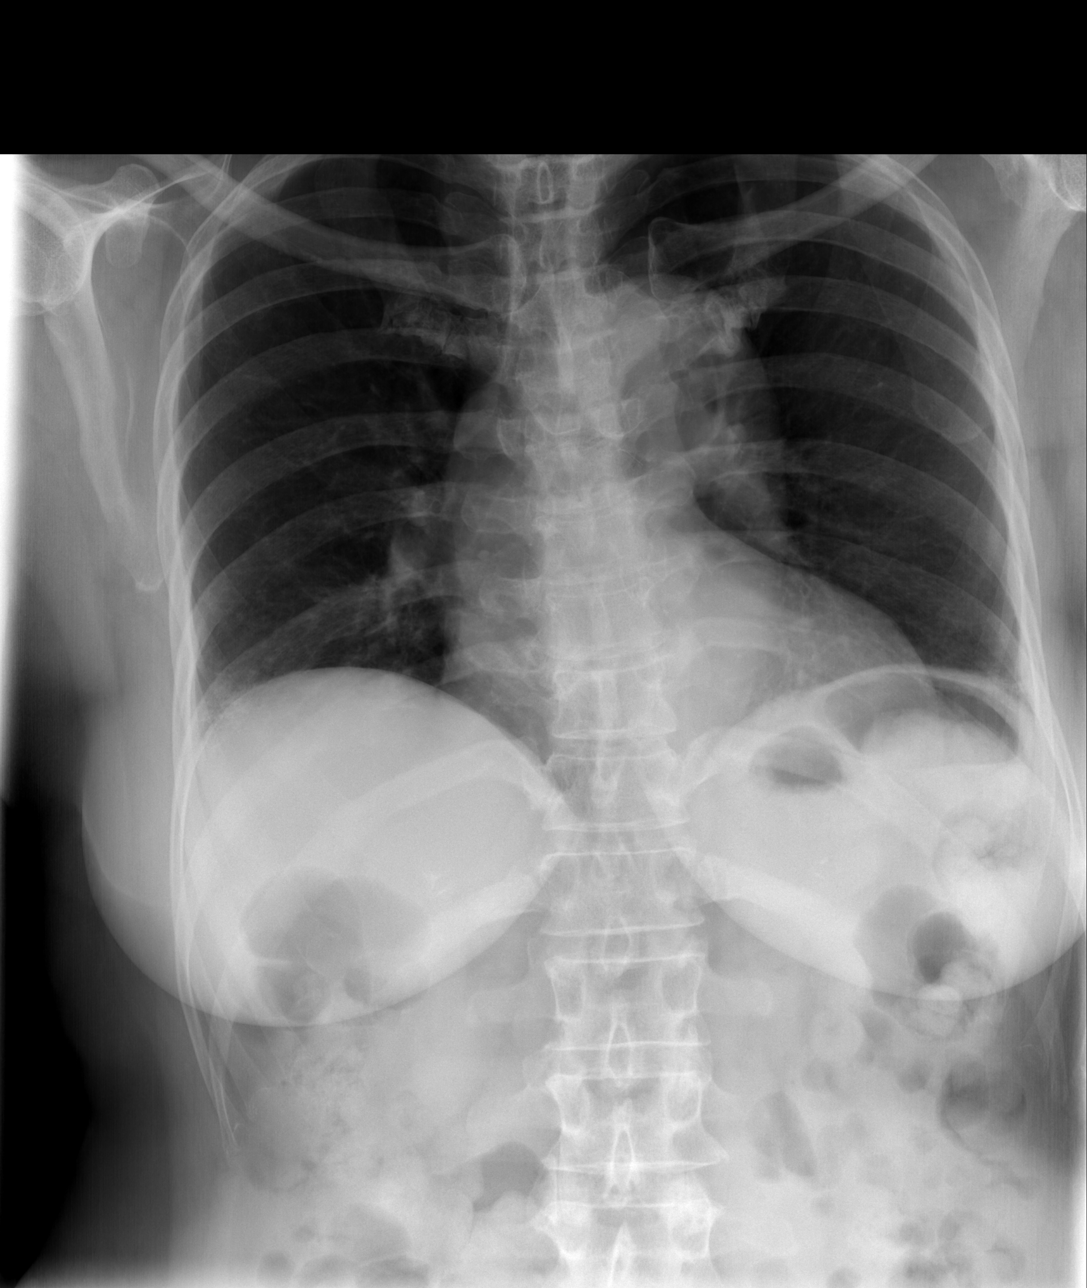

[4 of 4 positions shown; findings below may reference images not displayed]

FINDINGS: Heart and mediastinal contours are within normal limits.
No focal opacities or effusions.  No acute bony abnormality. No
evidence of left rib fracture.  No pneumothorax.
IMPRESSION: No acute findings.

## 2010-09-21 IMAGING — CT CT NECK W/O CM
3 series · 16 of 33 positions shown, 19 images · non-contrast
Comparison: None

CLINICAL DATA: Throat pain, fever.

CT NECK WITHOUT CONTRAST
TECHNIQUE: Multidetector CT imaging of the neck was performed
without intravenous contrast.

[Series 2: 2cc/(id)/1cc/(id) · axial · 0.51mm/px · z∈[-324,-132]mm · 8 of 60 slices shown, 10 images]
[im 5/60  soft-tissue]
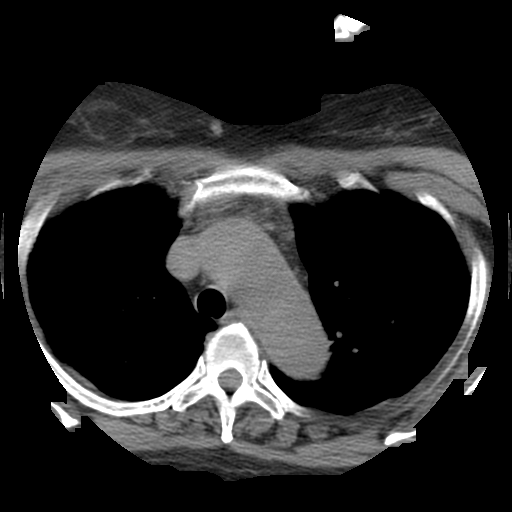
[im 5/60  bone]
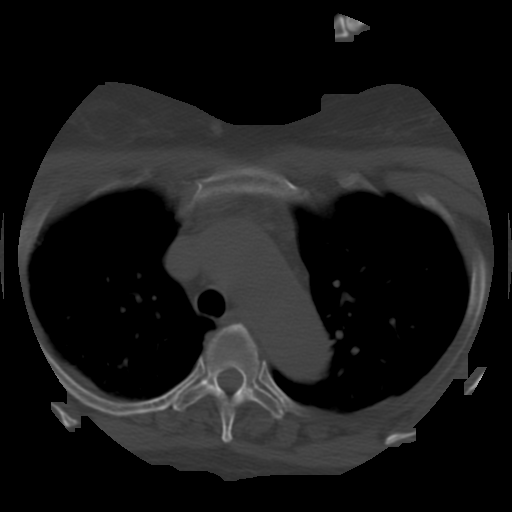
[im 14/60  bone]
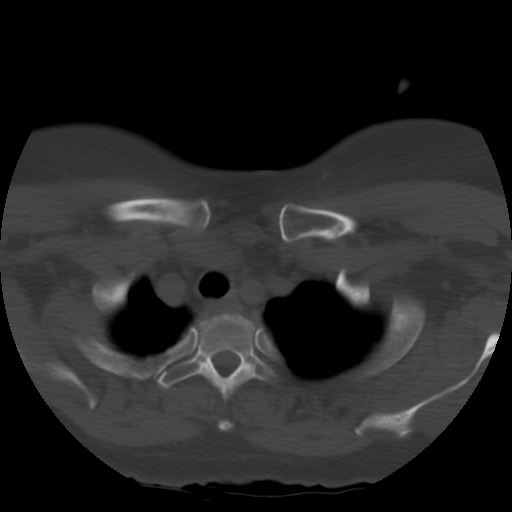
[im 19/60  bone]
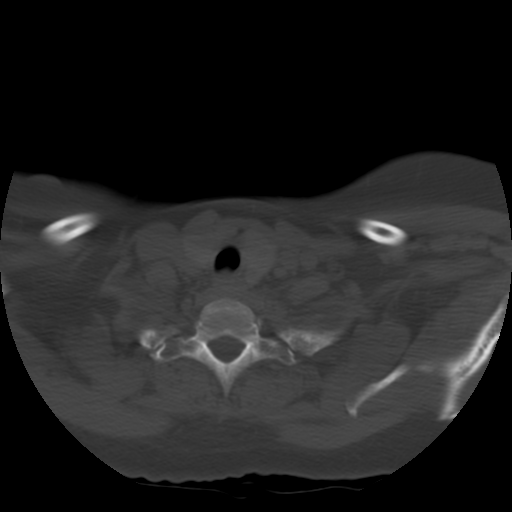
[im 28/60  bone]
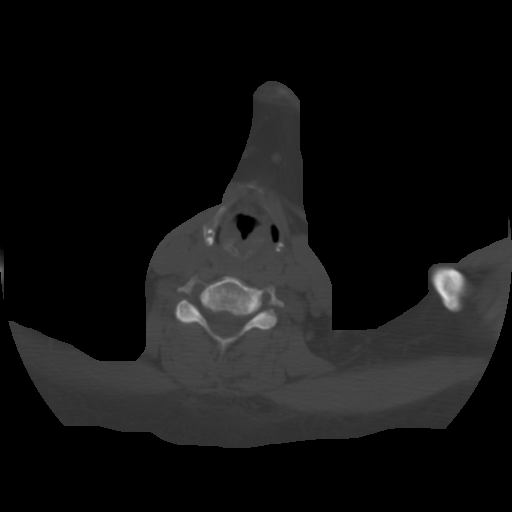
[im 32/60  soft-tissue]
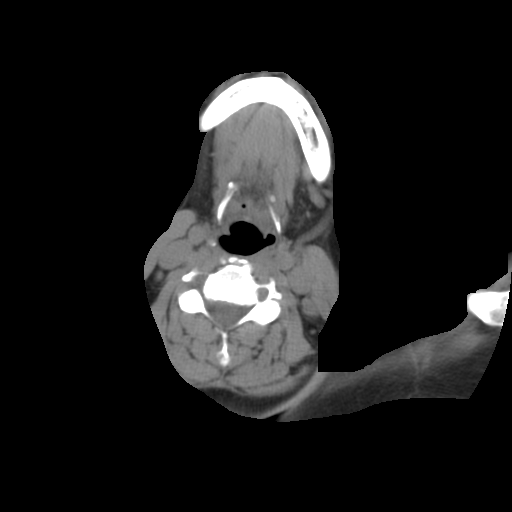
[im 32/60  bone]
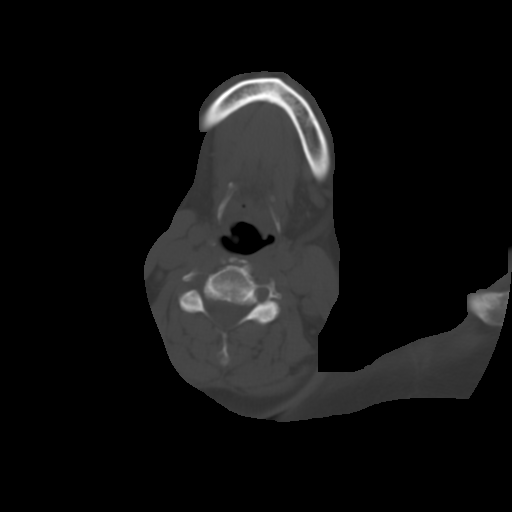
[im 41/60  bone]
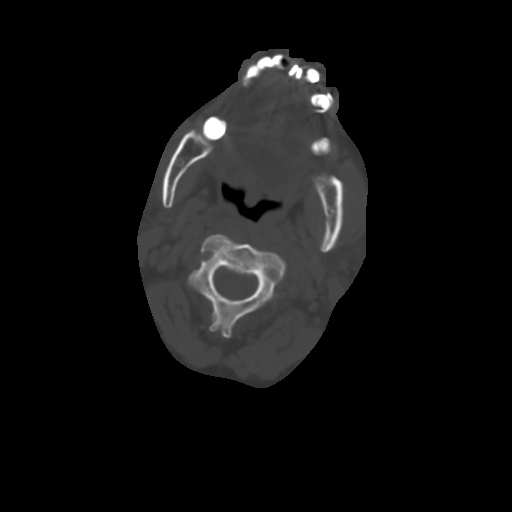
[im 46/60  bone]
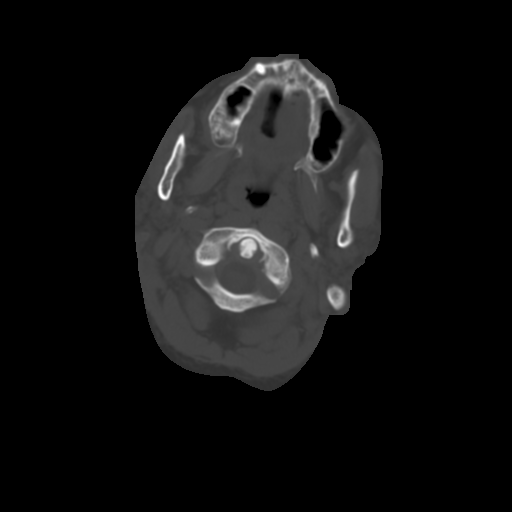
[im 55/60  bone]
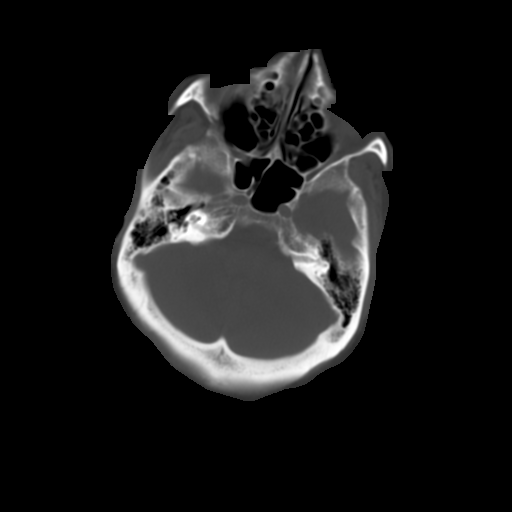

[Series 300: reformatted · coronal · 0.51mm/px · 3 of 108 slices shown (1 of 2)]
[im 22/108  bone]
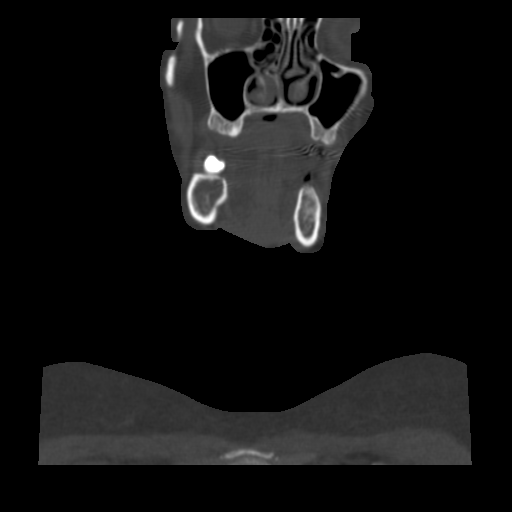
[im 43/108  bone]
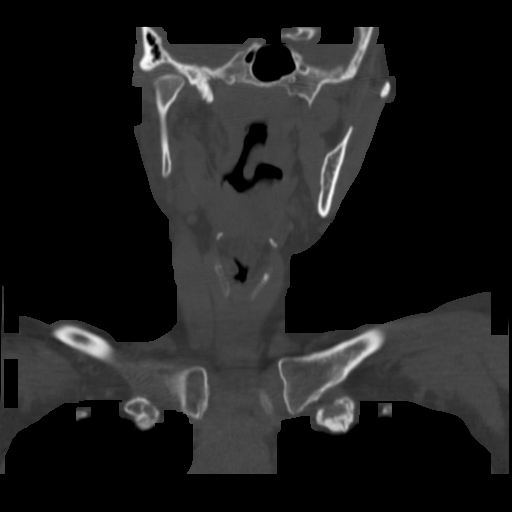
[im 65/108  bone]
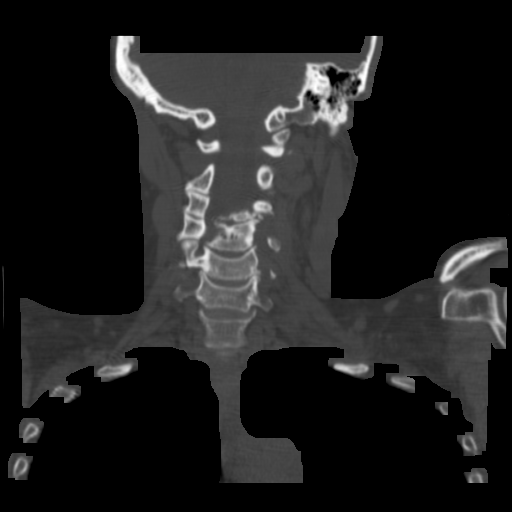

[Series 301: reformatted · sagittal · 0.51mm/px · 5 of 79 slices shown, 6 images (2 of 2)]
[im 27/79  bone]
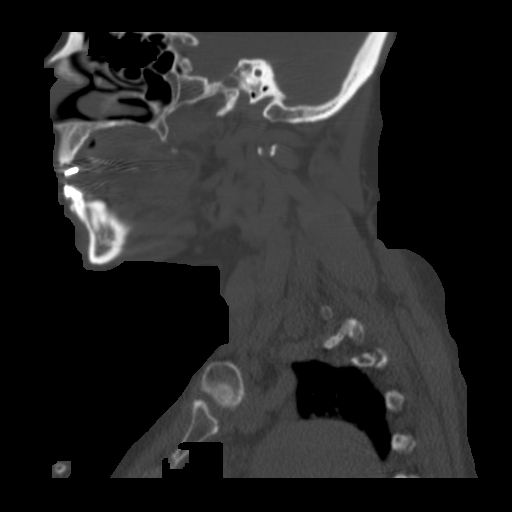
[im 33/79  bone]
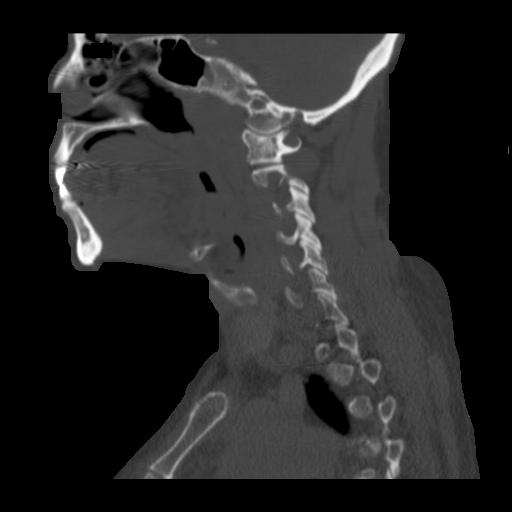
[im 40/79  soft-tissue]
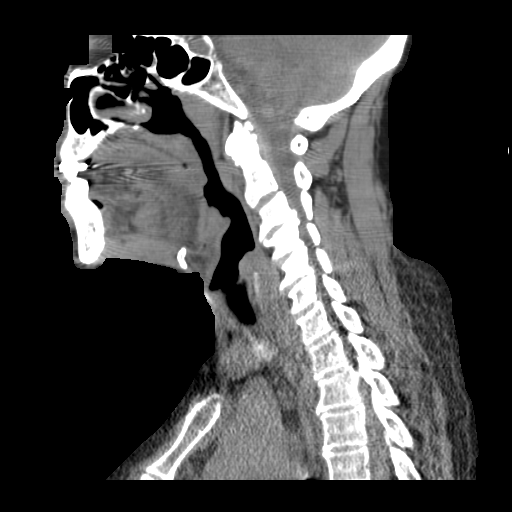
[im 40/79  bone]
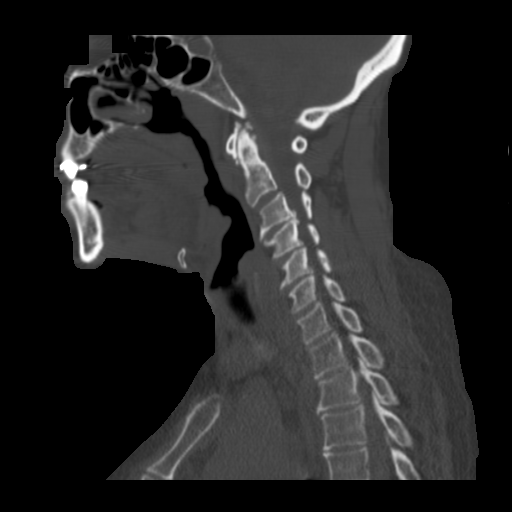
[im 46/79  bone]
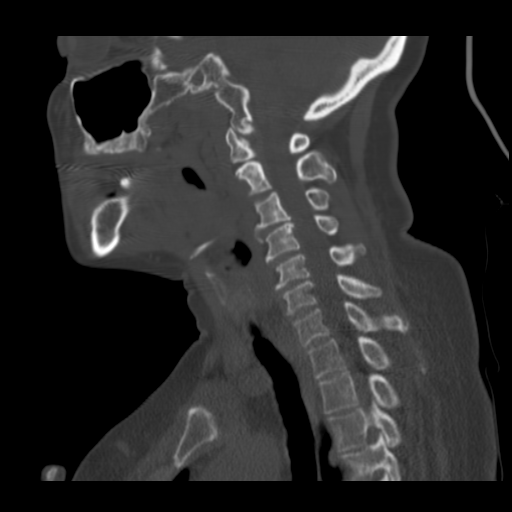
[im 53/79  bone]
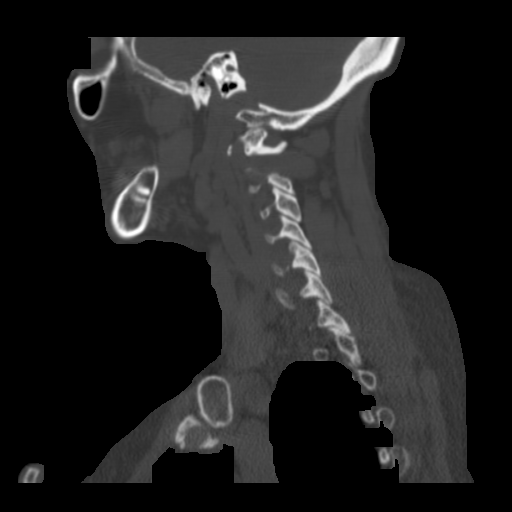

[16 of 33 positions shown; findings below may reference images not displayed]

FINDINGS: Airway is patent.  There are scattered small bilateral
cervical lymph nodes, none pathologically enlarged.  Epiglottis and
aryepiglottic folds unremarkable.

Thyroid gland and salivary glands including carotids unremarkable.
Visualized upper lungs clear.  Retropharyngeal soft tissues
unremarkable.

Diffuse spondylosis of the cervical spine.  Normal alignment.
IMPRESSION: Scattered small cervical lymph nodes bilaterally, likely reactive.
None pathologically enlarged.

Otherwise no acute findings.

## 2010-11-17 ENCOUNTER — Encounter: Payer: Self-pay | Admitting: Family Medicine

## 2011-01-10 LAB — URINALYSIS, ROUTINE W REFLEX MICROSCOPIC
Bilirubin Urine: NEGATIVE
Glucose, UA: NEGATIVE mg/dL
Hgb urine dipstick: NEGATIVE
Ketones, ur: NEGATIVE mg/dL
Nitrite: NEGATIVE
Protein, ur: NEGATIVE mg/dL
Specific Gravity, Urine: 1.015 (ref 1.005–1.030)
Urobilinogen, UA: 1 mg/dL (ref 0.0–1.0)
pH: 6 (ref 5.0–8.0)

## 2011-01-10 LAB — COMPREHENSIVE METABOLIC PANEL
ALT: 28 U/L (ref 0–35)
AST: 23 U/L (ref 0–37)
Albumin: 4 g/dL (ref 3.5–5.2)
Alkaline Phosphatase: 91 U/L (ref 39–117)
BUN: 17 mg/dL (ref 6–23)
CO2: 22 mEq/L (ref 19–32)
Calcium: 9.3 mg/dL (ref 8.4–10.5)
Chloride: 105 mEq/L (ref 96–112)
Creatinine, Ser: 1.25 mg/dL — ABNORMAL HIGH (ref 0.4–1.2)
GFR calc Af Amer: 53 mL/min — ABNORMAL LOW (ref >60)
GFR calc non Af Amer: 44 mL/min — ABNORMAL LOW (ref >60)
Glucose, Bld: 87 mg/dL (ref 70–99)
Potassium: 3.9 mEq/L (ref 3.5–5.1)
Sodium: 136 mEq/L (ref 135–145)
Total Bilirubin: 0.7 mg/dL (ref 0.3–1.2)
Total Protein: 7.9 g/dL (ref 6.0–8.3)

## 2011-01-10 LAB — URINE MICROSCOPIC-ADD ON

## 2011-02-05 LAB — BASIC METABOLIC PANEL
BUN: 31 mg/dL — ABNORMAL HIGH (ref 6–23)
CO2: 21 mEq/L (ref 19–32)
Calcium: 9.7 mg/dL (ref 8.4–10.5)
Chloride: 102 mEq/L (ref 96–112)
Creatinine, Ser: 1.89 mg/dL — ABNORMAL HIGH (ref 0.4–1.2)
GFR calc Af Amer: 33 mL/min — ABNORMAL LOW (ref >60)
GFR calc non Af Amer: 28 mL/min — ABNORMAL LOW (ref >60)
Glucose, Bld: 98 mg/dL (ref 70–99)
Potassium: 4.5 mEq/L (ref 3.5–5.1)
Sodium: 136 mEq/L (ref 135–145)

## 2011-02-05 LAB — CBC
HCT: 38.7 % (ref 36.0–46.0)
Hemoglobin: 13.4 g/dL (ref 12.0–15.0)
MCHC: 34.7 g/dL (ref 30.0–36.0)
MCV: 95.2 fL (ref 78.0–100.0)
Platelets: 315 10*3/uL (ref 150–400)
RBC: 4.06 MIL/uL (ref 3.87–5.11)
RDW: 14.9 % (ref 11.5–15.5)
WBC: 9.6 10*3/uL (ref 4.0–10.5)

## 2011-02-05 LAB — DIFFERENTIAL
Basophils Absolute: 0 10*3/uL (ref 0.0–0.1)
Basophils Relative: 1 % (ref 0–1)
Eosinophils Absolute: 0 10*3/uL (ref 0.0–0.7)
Eosinophils Relative: 0 % (ref 0–5)
Lymphocytes Relative: 16 % (ref 12–46)
Lymphs Abs: 1.5 10*3/uL (ref 0.7–4.0)
Monocytes Absolute: 0.7 10*3/uL (ref 0.1–1.0)
Monocytes Relative: 7 % (ref 3–12)
Neutro Abs: 7.4 10*3/uL (ref 1.7–7.7)
Neutrophils Relative %: 77 % (ref 43–77)

## 2011-02-11 LAB — URINALYSIS, ROUTINE W REFLEX MICROSCOPIC
Bilirubin Urine: NEGATIVE
Glucose, UA: NEGATIVE mg/dL
Hgb urine dipstick: NEGATIVE
Ketones, ur: NEGATIVE mg/dL
Nitrite: NEGATIVE
Protein, ur: NEGATIVE mg/dL
Specific Gravity, Urine: 1.02 (ref 1.005–1.030)
Urobilinogen, UA: 0.2 mg/dL (ref 0.0–1.0)
pH: 6.5 (ref 5.0–8.0)

## 2011-02-11 LAB — COMPREHENSIVE METABOLIC PANEL
ALT: 14 U/L (ref 0–35)
AST: 13 U/L (ref 0–37)
Albumin: 4.2 g/dL (ref 3.5–5.2)
Alkaline Phosphatase: 89 U/L (ref 39–117)
BUN: 23 mg/dL (ref 6–23)
CO2: 29 mEq/L (ref 19–32)
Calcium: 11 mg/dL — ABNORMAL HIGH (ref 8.4–10.5)
Chloride: 103 mEq/L (ref 96–112)
Creatinine, Ser: 1.37 mg/dL — ABNORMAL HIGH (ref 0.4–1.2)
GFR calc Af Amer: 48 mL/min — ABNORMAL LOW (ref >60)
GFR calc non Af Amer: 40 mL/min — ABNORMAL LOW (ref >60)
Glucose, Bld: 101 mg/dL — ABNORMAL HIGH (ref 70–99)
Potassium: 4.4 mEq/L (ref 3.5–5.1)
Sodium: 139 mEq/L (ref 135–145)
Total Bilirubin: 0.7 mg/dL (ref 0.3–1.2)
Total Protein: 7.5 g/dL (ref 6.0–8.3)

## 2011-02-11 LAB — POCT I-STAT, CHEM 8
BUN: 25 mg/dL — ABNORMAL HIGH (ref 6–23)
Calcium, Ion: 1.11 mmol/L — ABNORMAL LOW (ref 1.12–1.32)
Chloride: 101 mEq/L (ref 96–112)
Creatinine, Ser: 1.3 mg/dL — ABNORMAL HIGH (ref 0.4–1.2)
Glucose, Bld: 115 mg/dL — ABNORMAL HIGH (ref 70–99)
HCT: 47 % — ABNORMAL HIGH (ref 36.0–46.0)
Hemoglobin: 16 g/dL — ABNORMAL HIGH (ref 12.0–15.0)
Potassium: 3.8 mEq/L (ref 3.5–5.1)
Sodium: 137 mEq/L (ref 135–145)
TCO2: 30 mmol/L (ref 0–100)

## 2011-02-11 LAB — CBC
HCT: 36.3 % (ref 36.0–46.0)
Hemoglobin: 12.4 g/dL (ref 12.0–15.0)
MCHC: 34.1 g/dL (ref 30.0–36.0)
MCV: 93.7 fL (ref 78.0–100.0)
Platelets: 251 10*3/uL (ref 150–400)
RBC: 3.87 MIL/uL (ref 3.87–5.11)
RDW: 16.2 % — ABNORMAL HIGH (ref 11.5–15.5)
WBC: 5.9 10*3/uL (ref 4.0–10.5)

## 2011-02-11 LAB — PROTIME-INR
INR: 1 (ref 0.00–1.49)
Prothrombin Time: 13 seconds (ref 11.6–15.2)

## 2011-02-11 LAB — DIFFERENTIAL
Basophils Absolute: 0.1 10*3/uL (ref 0.0–0.1)
Basophils Relative: 1 % (ref 0–1)
Eosinophils Absolute: 0.1 10*3/uL (ref 0.0–0.7)
Eosinophils Relative: 2 % (ref 0–5)
Lymphocytes Relative: 28 % (ref 12–46)
Lymphs Abs: 1.6 10*3/uL (ref 0.7–4.0)
Monocytes Absolute: 0.5 10*3/uL (ref 0.1–1.0)
Monocytes Relative: 9 % (ref 3–12)
Neutro Abs: 3.5 10*3/uL (ref 1.7–7.7)
Neutrophils Relative %: 60 % (ref 43–77)

## 2011-02-11 LAB — URINE CULTURE: Colony Count: 5000

## 2011-02-11 LAB — URINE MICROSCOPIC-ADD ON

## 2011-03-14 NOTE — H&P (Signed)
NAME:  Michelle Graves, Michelle Graves                       ACCOUNT NO.:  [id number]   MEDICAL RECORD NO.:  [id number]                   PATIENT TYPE:  EMS   LOCATION:  ED                                   FACILITY:  [facility]   PHYSICIAN:  Hettie Holstein, D.O.                 DATE OF BIRTH:  [birth year]   DATE OF ADMISSION:  07/31/2003  DATE OF DISCHARGE:                                HISTORY & PHYSICAL   CHIEF COMPLAINT:  Headache/dizziness.   HISTORY OF PRESENT ILLNESS:  This is a 60 year old African-American female  with a history of long-standing hypertension with poor medical compliance  secondary to insurance problems.  She was formerly on Maxzide and clonidine,  but stopped two months ago, as well as Celebrex for rheumatoid arthritis  which was diagnosed approximately two years ago.  She sees predominately  physicians at the [facility];  however, she has not  been to the physician in what she states over a year.  She reported having  headache without dizziness over the past couple of days and this is what  brought her into the emergency department here at [facility].  She does  report a history of cocaine on Friday.  She states that this was an isolated  event.  She has not used cocaine for several months she states.  She has not  had any cocaine over the past 24 to 48 hours.  She states that she has had  no chest pain;  however, she has noticed some palpitations at night  occasionally.  Otherwise, she does report some swelling of her left lower  extremity that did follow her use of cocaine on Friday, and today she  noticed some swelling in her left arm which has subsided at this time.  She  denies any prior history of heart problems that she is aware of.  She states  that she had problems with blood pressure for a long time.  She states that  this was somewhat under control on the medications she was on formerly.   PAST MEDICAL HISTORY:  1. Hypertension.  2. History  of cocaine abuse which the patient states is infrequent.  3. History of tonsillectomy.  4. History of rheumatoid arthritis diagnosed two years ago.   MEDICATIONS:  The patient formerly was on Maxzide and clonidine;  however,  she has not taken these medications in over two months.   ALLERGIES:  No known drug allergies.   SOCIAL HISTORY:  The patient is a former employee of [employer];  however, secondary to her rheumatoid arthritis she has been unable to work,  currently applying for [employer].  She reports infrequent use of cocaine,  last was on Friday.  She states that she does drink approximately a 12 pack  of beer over a course of a week.  She has never had DTs before.  She has two  healthy children delivered via spontaneous vaginal delivery.   FAMILY HISTORY:  Significant for father who died at the age of 32,  approximately one week ago, with heart problems.  Her mother has problems  with arthritis;  however, she is alive and in her 59s with only  complications of arthritis.   REVIEW OF SYSTEMS:  The patient does complain of headaches.  She states that  her vision is not acutely changed, but she feels as though she needs  glasses.  She has no tinnitus.  She has no cough or productive sputum.  She  denies any chest pain.  She reports some palpitations at night.  She has no  breathing difficulty.  She has no nausea, vomiting, diarrhea.  Last  menstrual period was approximately six months ago.  She reports having hot  flashes.  She denies dysuria.  She complains of arthritic complaints,  specifically with regard to her hands.  Further review of systems is  unremarkable.   PHYSICAL EXAMINATION:  VITAL SIGNS:  The patient's vitals were at the time  of my examination:  Systolic blood pressure 185, diastolic of 122, heart  rate of 70, she was afebrile and not tachypneic.  GENERAL:  She was alert and oriented, in no acute distress.  HEENT:  Head to be normocephalic, atraumatic.   No palpable thyromegaly.  Funduscopic exam revealed no evidence of hemorrhage.  CARDIOVASCULAR:  Clear to auscultation.  S1 and S2 with S4, no murmur  appreciated.  PULMONARY:  Clear to auscultation without wheeze or rhonchi.  ABDOMEN:  Soft, nontender.  EXTREMITIES:  No edema.  NEUROLOGIC:  No focal neurologic deficits and no facial asymmetry.  The  patient was euthymic and affect was stable.   LABORATORY DATA:  WBC of 8.5, hemoglobin 12.9, hematocrit 38.2, MCV of 91,  platelet count of 234,000.  Sodium 142, potassium 4, chloride 104, CO2 of  31, BUN 17, creatinine 1, glucose 101, calcium of 9.4, total protein 8, AST  of 18, ALT of 12, alkaline phosphatase of 86, bilirubin total 12.7.  UA  revealed gross proteinuria, small leukocyte esterase, suggestive of urinary  tract infection with small leukocyte esterase and 3 to 6 WBCs per high  powered field and rare bacteria.  CT of her head revealed a small amount of  fluid posterior within the sphenoid sinus, mild paraventricular white matter  hyperdensity, a nonspecific finding, incidental cava septum pellucidum.  Chest x-ray revealed borderline cardiac enlargement.  EKG is pending at this  time.   IMPRESSION AND PLAN:  Hypertensive urgency.  At present, the patient's blood  pressures are poorly controlled with emergency department interventions at  this time.  She is exhibiting evidence of end-organ damage with proteinuria  evident on initial screening examination.  She will be admitted for  intravenous treatment.  There is no history suggestive of recent intake of  cocaine, and the patient denies this.  We will use beta blockers at this  time;  however, we will check an urine drug screen.  If this is positive, we  will use a different agent.  We are going to start her on Accupril as well  as hydrochlorothiazide, check an EKG, check her lipid profile in the  morning, as well as thyroid function studies.  With regards to her rheumatoid  arthritis, we are going to start her on analgesics with Tylenol  and Codeine on a p.r.n. basis.  Follow her on a telemetry floor.  Check  frequent vital signs and  blood pressures.                                               Hettie Holstein, D.O.    ESS/MEDQ  D:  07/31/2003  T:  07/31/2003  Job:  [id number]

## 2011-03-14 NOTE — Discharge Summary (Signed)
Michelle Graves, Michelle Graves             ACCOUNT NO.:  [id number]   MEDICAL RECORD NO.:  [id number]          PATIENT TYPE:  INP   LOCATION:  4707                         FACILITY:  [facility]   PHYSICIAN:  Melissa L. Ladona Ridgel, MD  DATE OF BIRTH:  1951/05/12   DATE OF ADMISSION:  05/21/2005  DATE OF DISCHARGE:  05/25/2005                                 DISCHARGE SUMMARY   DISCHARGE DIAGNOSES:  1.  HYPERTENSIVE EMERGENCY. The patient was admitted to the hospital and      placed on a Nipride drip with good results. She was restarted on an      appropriate antihypertensive regimen, which she had not previously been      on any medications for blood pressure despite that she had hypertension.      We obtained good results with her blood pressures. We also counseled the      patient on not smoking and not using illicit drugs.  2.  COCAINE ABUSE.  Again, the patient was counseled against cocaine, as it      can  cause her to have a myocardial infarction. Her antihypertensive      medications were tailored, specifically to keep in mind the patient has      been using cocaine, I have changed her from atenolol to clonidine patch;      hopefully to prevent any interactions.  3.  MILD RENAL INSUFFICIENCY.  The patient likely has some underlying renal      dysfunction related to her longstanding hypertension. This has remained      stable. There is no intervention at this time, other than an ACE      inhibitor and appropriate blood pressure control. Hypokalemia was      repleted appropriately.  4.  CONSTIPATION. She has been able to move her bowels since admission, and      we have advised her that while she is on pain medication she may need to      use an over-the-counter medication for stool softening.  5.  RIGHT HIP PAIN. The patient relates that severe right hip pain is      keeping her awake at night, although on her physical exam she has no      straight leg raise.  X-rays of that hip show no acute  fracture. She does      have some arthritis and will need to be worked up as an outpatient with      orthopedics.  I will attempt to start her on some Neurontin, and will      provide her with limited amount of Vicodin so she can follow up with      [facility] next week.  6.  [facility] REFERRAL.  With as much medication as I can obtain from our      pharmacy, under our indigent plan.   MEDICATIONS AT THE TIME OF DISCHARGE:  1.  Hydrochlorothiazide 25 mg daily.  2.  Norvasc 5 mg q. daily.  3.  Imdur 30 mg q. daily.  4.  Colace 100 mg b.i.d.  5.  Catapres 0.1 mg patch changed every week on Saturday.  6.  Vicodin 5/500 1-2 tablets every 4-6 hours as needed. She will be given a      limited supply of 30 tablets  7.  Neurontin 300 mg q.h.s. I will only give her one prescription refill on      this, as I would like for her to follow up and see if this actually      helped her, since she really does not have any overt neurological      symptoms at this time; but, does describe some tingling in the lower      extremity that may represent neuropathy.  8.  Lisinopril 10 mg once daily.   ACTIVITY:  She is to resume her regular activities.   DIET:  Low salt, low cholesterol.   SPECIAL INSTRUCTIONS:  She is to call her doctor when established, or the  emergency room if she develops chest pain, shortness of breath, dizziness or  headache. She was instructed to avoid tobacco and any other medications or  substances that are not given to her by a doctor.  A [facility] referral  to establish a primary care physician, and can have an orthopedic as well as  possible cardiology workup as an outpatient.   HISTORY OF PRESENT ILLNESS:  The patient is a 60 year old African-American  female with known past medical history for hypertension. She has not been  taking any medications were seeing a physician. She states that she once  went to health service, but felt that she had to jump through too  many  hoops, and therefore she left. The patient also carries a diagnosis of  rheumatoid arthritis, although I do not see any indication for that during  this hospital stay.  The patient presented to the hospital with complaints  of overall pain and was found to have a blood pressure of 236/183. Her urine  was positive for cocaine. She also admits to drinking 7-10 beers per week,  but never has experienced delirium or detox effication. The patient was  admitted to the step-down area and started on the Nia pride drip with good  results. She was ultimately transferred to telemetry floor, where her blood  pressure medications were restarted.  A 2-D echo was obtained, which shows a  decreased ejection fraction of 40-60%  -- with no obvious wall motion  abnormalities.  The left wall was definitively thick, consistent with her  longstanding hypertension.  She also has abnormal left ventricular  relaxation, and she may have a possible bicuspid aortic valve which was not  clearly visualized in her echocardiogram, and there was trivial aortic  valvular regurgitation as well as mild mitral valvular regurgitation. In  light of the fact that the patient has a decreased ejection fraction, an ACE  inhibitor was added to her regime. Also, her beta blocker was discontinued  and she was started on clonidine patch in order to improve compliance and  also not interfere if she were to decide to smoke cocaine again.   PHYSICAL EXAMINATION:  VITAL SIGNS:  On the day of discharge the patient is  hemodynamically stable (May 25, 2005).  Temperature is 97.7, blood pressure  143/95, pulse 68, respirations 20, saturation 100%. GENERAL:  This is a well-  developed, well-nourished African-American female in no acute distress.  HEENT:  She is normocephalic, atraumatic. Pupils equal, round and reactive  to light. Extraocular muscles are intact. Mucous membranes are moist. NECK:  Supple.  No JVD.  No lymphadenopathy.  No  carotid bruits.  ABDOMEN:  Soft, nontender, nondistended.  EXTREMITIES:  Show no clubbing, cyanosis or edema. She has 2+ pulses.  A  straight leg test is negative. although she does complain of intermittent  tingling sensation in the lower extremity. Power is 75. DTRs are 2+.   PERTINENT LABORATORY VALUES DURING THE COURSE OF HOSPITAL STAY:  Reveal a  discharging white count of 7.1, hemoglobin of 12.0, hematocrit 35.8,  platelets of 222. Sodium is 139, potassium 2.7, chloride is 103, CO2 26, BUN  24, creatinine is 1.2.   LFTs were within normal limits.  Albumin was decreased at 3.3. TSH was 0.650  within normal limits, as stated drugs of abuse screen showed cocaine  positive.   Please see above for her 2-D echo results.  The patient also had an x-ray of  her right hip.  Both hips she has  bilateral degenerative hip disease with  no acute abnormality.  A CT of the pelvis was done during the admission,  because of her leg pain.  This showed tortuous iliac vessels.  Pelvic  structures were unremarkable.  Bowel showed some  constipation, but no adenopathy or obvious free air.  She had a CT of her  head on May 21, 2005, which showed no acute bleeds or intracranial process;  she might have nonspecific white matter changes were noted.   At the time, the patient is deemed stable for discharge to home; with follow-  up to [facility].       MLT/MEDQ  D:  05/25/2005  T:  05/25/2005  Job:  [id number]

## 2011-03-14 NOTE — Discharge Summary (Signed)
NAME:  Michelle Graves, Michelle Graves                       ACCOUNT NO.:  [id number]   MEDICAL RECORD NO.:  [id number]                   PATIENT TYPE:  INP   LOCATION:  0359                                 FACILITY:  [facility]   PHYSICIAN:  Hettie Holstein, D.O.                 DATE OF BIRTH:  Sep 25, 1951   DATE OF ADMISSION:  07/31/2003  DATE OF DISCHARGE:  08/01/2003                                 DISCHARGE SUMMARY   ADMISSION DIAGNOSES:  Hypertensive urgency.   DISCHARGE DIAGNOSES:  1. Hypertensive urgency.  2. Proteinuria as well as abnormal CT revealing small amount of fluid     posterior within the sphenoid sinus and nonspecific changes consistent     with small vessel disease on her head CT as well.   CONSULTS:  None.   PROCEDURE:  None.   DISPOSITION:  The patient is scheduled to follow up with the outpatient  clinic on Friday, October 8 at 9:45 a.m. and she is undergoing the process  to become a patient at [facility].  She has been evaluated by our case  manager service here at [facility].  It is recommended that she undergo a  repeat basic metabolic panel over the next few weeks once she establishes a  primary care physician.   DISCHARGE MEDICATIONS:  1. Hydrochlorothiazide 25 mg p.o. daily.  2. Accupril 10 mg p.o. daily.   HISTORY OF PRESENT ILLNESS:  This is a 60 year old African-American female  with a history of long-standing hypertension with poor medical compliance  secondary to insurance problems.  She was formerly on Maxzide and clonidine,  but stopped two months ago, as well as Celebrex for rheumatoid arthritis  which was diagnosed approximately two years ago.  She sees predominantly  physicians at [facility].  However, she has not been to  the physician in what she states over a year.  She reports having a headache  without dizziness over the past couple of days and this is what brought her  to the emergency department here at [facility].  She does  report a history  of cocaine on Friday.  She states that this was an isolated use.  She had  not been using this for quite some time.  She has not had any cocaine over  the past 24-48 hours.  She states that she has had no chest pain; however,  she has noticed some palpitations at night occasionally.  Otherwise, she  does report some swelling of her left lower extremity and did follow her use  of cocaine on Friday, and today she noticed some swelling in her left arm  which has subsided at the time.  She denies any frank history of heart  problems that she is aware of.  She states that she had no problems with  blood pressure for a long time.  She states that this was somewhat under  control on medications that she was on formerly.   PAST MEDICAL HISTORY:  1. Relatively limited to what was stated above.  Hypertension.  2. Cocaine abuse.  3. History of tonsillectomy.  4. Rheumatoid arthritis diagnosed a couple years ago.   She was found to be profoundly hypertensive in the emergency department with  a hypertensive crisis, diastolic maintained above 120 and complaints of  headache and dizziness.  She was noted to have gross protein in her urine on  initial UA.  Emergency department performed head CT scan on her and findings  were as noted above.   HOSPITAL COURSE:  The patient was admitted to telemetry floor.  She was  started on labetalol IV.  She stated that she had no cocaine over the past  24-48 hours.  Her blood pressures responded quite well with IV labetalol and  the initiation of Accupril and hydrochlorothiazide.  She felt much better.  The subsequent day no complaints of chest pain or shortness of breath.  Case  managers had talked to her about [facility] and procedures had been  initiated in bringing her into that clinic.   LABORATORIES:  This admission hemoglobin 12.9, hematocrit 38.2, MCV 91,  platelet count 234,000.  Urine drug screen positive for cocaine.  Sodium  142,  potassium 4, chloride 104, CO2 31, BUN 17, creatinine 1.0, glucose 101,  calcium 9.4.  EKG revealed left ventricular hypertrophy and some nonspecific  T-wave abnormalities.                                               Hettie Holstein, D.O.    ESS/MEDQ  D:  08/01/2003  T:  08/01/2003  Job:  [id number]

## 2011-03-14 NOTE — H&P (Signed)
NAME:  CHIZARA, MENA NO.:  [id number]   MEDICAL RECORD NO.:  [id number]          PATIENT TYPE:  INP   LOCATION:  NA                           FACILITY:  [facility]   PHYSICIAN:  Sherin Quarry, MD      DATE OF BIRTH:  February 22, 1951   DATE OF ADMISSION:  DATE OF DISCHARGE:                                HISTORY & PHYSICAL   HISTORY OF PRESENT ILLNESS:  Michelle Graves is a 60 year old lady with  an approximately 10-year history of hypertension. During that time, she has  never taken any medication consistently.  In the past, she has been treated  intermittently with agents including Maxzide, clonidine, and Accupril.  Her  last hospitalization was in October 2004 when she presented with a blood  pressure of 185/122 and was treated as a hypertensive urgency.  Today, the  patient states that she came into the hospital because she hurt all over.  On arrival, her blood pressure was noted to be 236/183 with a pulse of 102.  Also of note was that her urine drug screen was positive for cocaine.  On  further questioning, she indicated that she uses cocaine about twice a week  and that last time that she smoked crack was on Sunday.  She states that she  consumes about 7-10 beers per week and that she has never been in either  detox or experienced delirium tremens.  She is admitted at this time for  treatment of hypertensive urgency.  She denies any recent history of  headache, breathing difficulty, chest pain, nausea, vomiting, abdominal  pain, change in bowel habits, melena, or hematochezia.   PAST MEDICAL HISTORY:   ALLERGIES:  There are no known drug allergies.   MEDICATIONS:  She states she currently takes no medications.  She does not  remember the last time that she took a blood pressure medication.   OPERATIONS:  She states that she has had no operations.   MEDICAL ILLNESSES:  1.  Hypertension times approximately 10 years.  2.  A long-standing history of cocaine  abuse. The patient has in the past      consistently responded to questioning that she uses cocaine about twice      a week.  3.  History of rheumatoid arthritis diagnosed in 2002 principally affecting      fingers, wrists, and elbows.  The patient states that she is disabled as      a result of rheumatoid arthritis.   FAMILY HISTORY:  The patient has a father who died at the age of 69 of heart  disease. Her mother has a long-standing history of hypertension. She has a  brother and a sister who have hypertension. She states that she has two  children who are in good health.   SOCIAL HISTORY:  The patient was formerly employed as a residence Radio producer at [employer], but she states that she has been unemployed  because of her rheumatoid arthritis.  She states that her arthritis makes it  difficult to perform activities with her hands.   REVIEW OF SYSTEMS:  HEAD:  She denies headache or dizziness. EYES:  She  denies visual blurring or diplopia.  EARS, NOSE AND THROAT:  Denies earache,  sinus pain or sore throat. CHEST:  Denies coughing, wheezing, or chest  congestion CARDIOVASCULAR:  Denies orthopnea, PND, or ankle edema. GI:  Denies nausea, vomiting or abdominal pain. GU:  Denies dysuria or urinary  frequency. NEURO:  There is no history of seizure or stroke.  ENDOCRINE:  Denies excessive thirst, urinary frequency, or nocturia.   PHYSICAL EXAMINATION:  GENERAL:  Michelle Graves is an alert woman who appears  to be in no acute distress.  HEENT:  Exam is within normal limits.  CHEST:  Clear.  CARDIOVASCULAR:  Reveals normal S1-S2 without rubs, murmurs, or gallops.  ABDOMEN:  Benign with normal bowel sounds without masses, tenderness, or  organomegaly.  NEUROLOGIC:  Neurologic testing and examination extremities is normal.   IMPRESSION:  1.  Hypertensive urgency.  2.  Long-standing history of hypertension.  3.  Long-standing history of medical noncompliance.  4.  Chronic  cocaine abuse.  5.  Chronic headache.  6.  Rheumatoid arthritis.   PLAN:  I will admit the patient for the purpose of regulating her blood  pressure.  It appears in the past that she has done well with a variety of  agents including Catapres and Labetalol.  Initially, her blood pressure was  being regulated with nitroprusside.  Will start oral agents and try to wean  off the nitroprusside.  Clearly, the biggest problem in the long run is her  chronic cocaine usage and her chronic refusal to follow medical advice.  Hopefully, we can convince her to take blood pressure medication on a  regular basis.       SY/MEDQ  D:  05/21/2005  T:  05/21/2005  Job:  [id number]

## 2011-07-11 IMAGING — MG MM DIGITAL DIAG LTD R
2 series · 2 of 2 positions shown · non-contrast
Comparison: 11/30/2008, 12/03/2009

CLINICAL DATA: Screening callback for questioned right breast mass

DIGITAL DIAGNOSTIC RIGHT MAMMOGRAM WITHOUT CAD

[R CC]
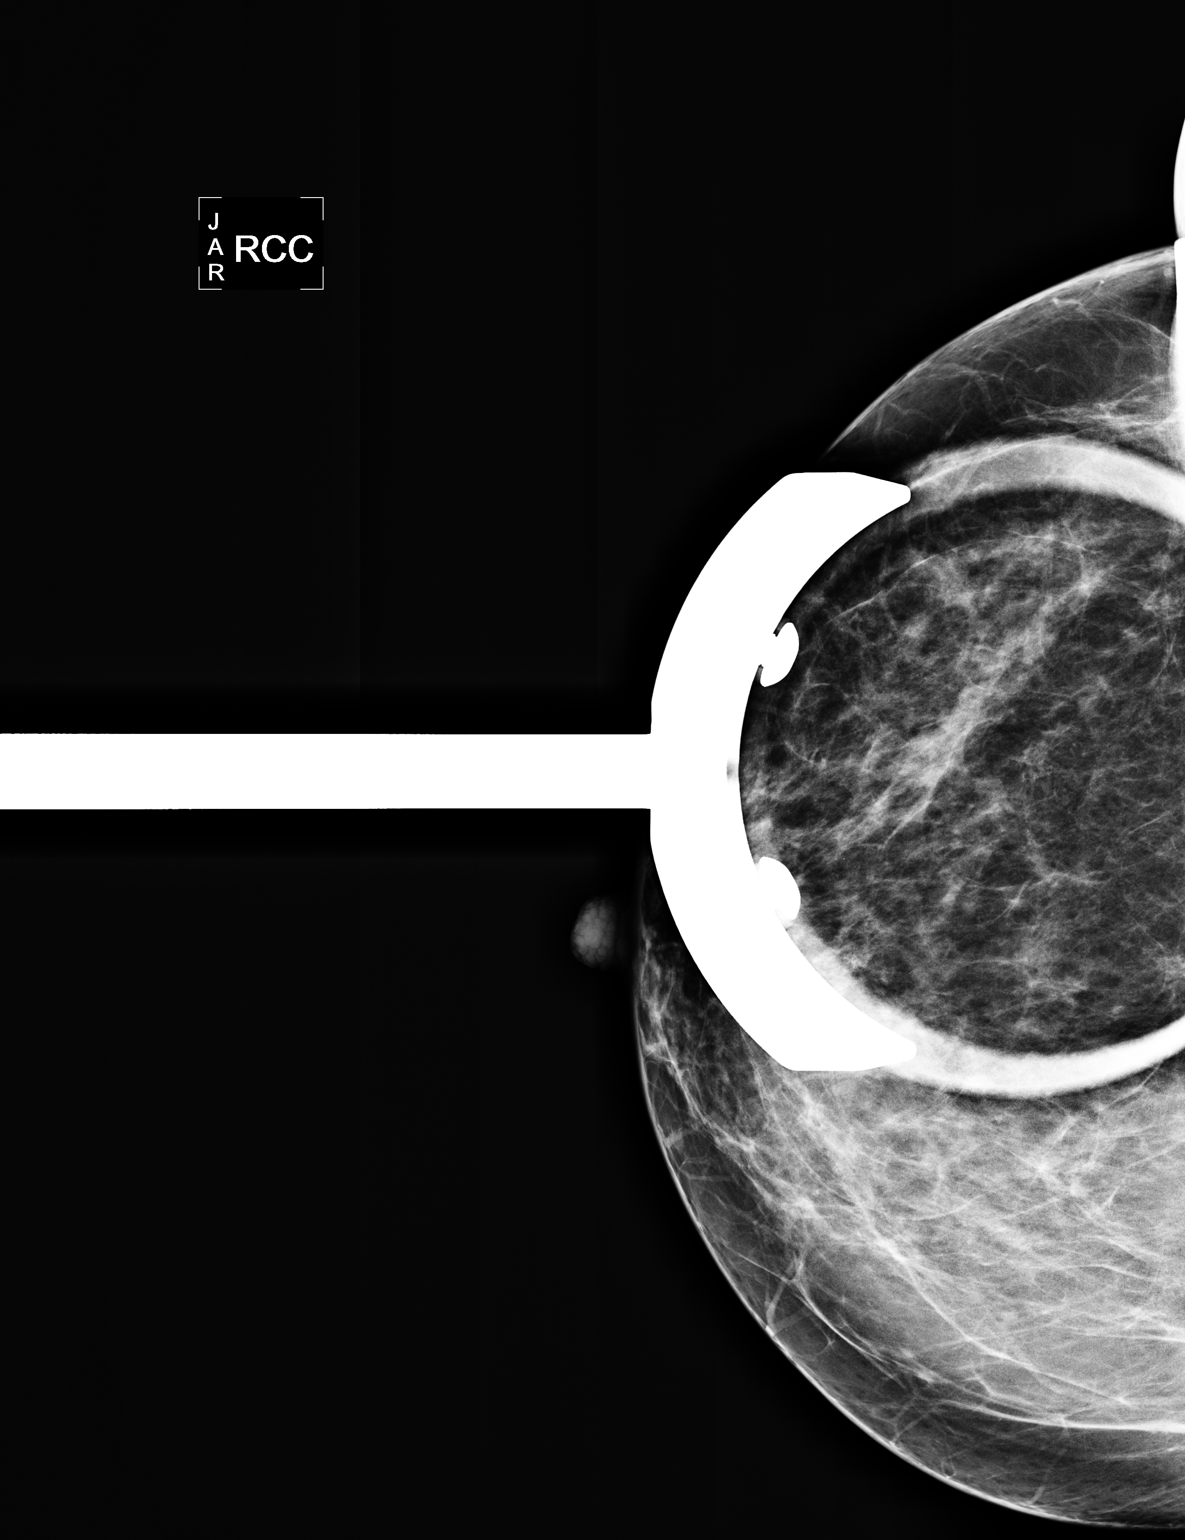

[R MLO]
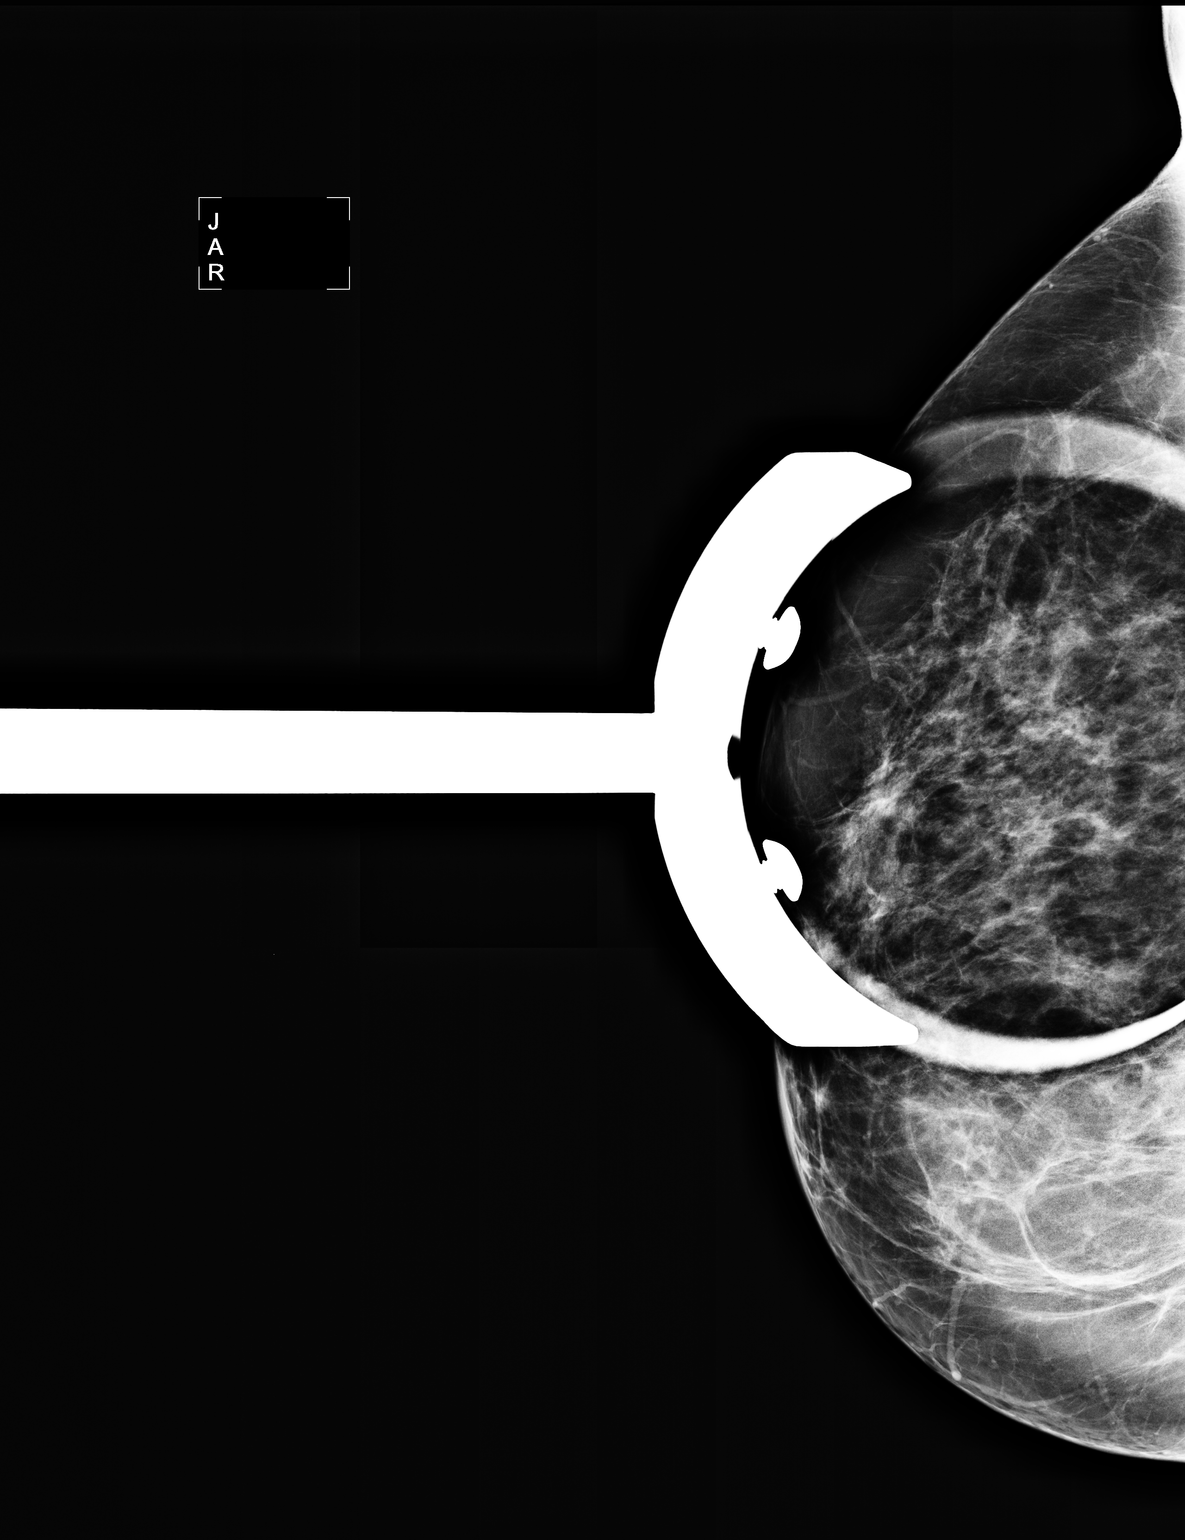

[2 of 2 positions shown; findings below may reference images not displayed]

FINDINGS: The questioned mass is not reproduced on additional
imaging.  No worrisome finding is seen.
IMPRESSION: No evidence for malignancy in the right breast.  The questioned
finding on screening mammography is not reproduced. Screening
mammography is recommended in one year. Findings and
recommendations discussed with the patient and provided in written
form at the time of the exam.

BI-RADS CATEGORY 1:  Negative.

## 2011-07-23 LAB — COMPREHENSIVE METABOLIC PANEL
ALT: 17
AST: 21
Albumin: 3.7
Alkaline Phosphatase: 87
BUN: 30 — ABNORMAL HIGH
CO2: 28
Calcium: 9.5
Chloride: 107
Creatinine, Ser: 2.06 — ABNORMAL HIGH
GFR calc Af Amer: 30 — ABNORMAL LOW
GFR calc non Af Amer: 25 — ABNORMAL LOW
Glucose, Bld: 96
Potassium: 3.7
Sodium: 145
Total Bilirubin: 0.5
Total Protein: 7.8

## 2011-07-23 LAB — DIFFERENTIAL
Basophils Absolute: 0
Basophils Relative: 0
Eosinophils Absolute: 0.2
Eosinophils Relative: 2
Lymphocytes Relative: 25
Lymphs Abs: 2
Monocytes Absolute: 0.7
Monocytes Relative: 8
Neutro Abs: 5.4
Neutrophils Relative %: 65

## 2011-07-23 LAB — CBC
HCT: 35.3 — ABNORMAL LOW
Hemoglobin: 11.9 — ABNORMAL LOW
MCHC: 33.6
MCV: 92.6
Platelets: 348
RBC: 3.81 — ABNORMAL LOW
RDW: 14.5
WBC: 8.3

## 2016-03-31 ENCOUNTER — Emergency Department (HOSPITAL_COMMUNITY): Payer: No Typology Code available for payment source

## 2016-03-31 ENCOUNTER — Emergency Department (HOSPITAL_COMMUNITY)
Admission: EM | Admit: 2016-03-31 | Discharge: 2016-03-31 | Disposition: A | Discharge status: Home / Self Care | Payer: No Typology Code available for payment source | Attending: Emergency Medicine | Admitting: Emergency Medicine

## 2016-03-31 ENCOUNTER — Encounter (HOSPITAL_COMMUNITY): Payer: Self-pay | Admitting: Emergency Medicine

## 2016-03-31 DIAGNOSIS — S8992XA Unspecified injury of left lower leg, initial encounter: Secondary | ICD-10-CM | POA: Diagnosis not present

## 2016-03-31 DIAGNOSIS — Y92410 Unspecified street and highway as the place of occurrence of the external cause: Secondary | ICD-10-CM | POA: Diagnosis not present

## 2016-03-31 DIAGNOSIS — Z79899 Other long term (current) drug therapy: Secondary | ICD-10-CM | POA: Diagnosis not present

## 2016-03-31 DIAGNOSIS — Y998 Other external cause status: Secondary | ICD-10-CM | POA: Insufficient documentation

## 2016-03-31 DIAGNOSIS — Y9389 Activity, other specified: Secondary | ICD-10-CM | POA: Insufficient documentation

## 2016-03-31 DIAGNOSIS — I10 Essential (primary) hypertension: Secondary | ICD-10-CM | POA: Diagnosis not present

## 2016-03-31 DIAGNOSIS — S8991XA Unspecified injury of right lower leg, initial encounter: Secondary | ICD-10-CM | POA: Insufficient documentation

## 2016-03-31 NOTE — ED Notes (Signed)
Patient undressed, in gown, on monitor, continuous pulse oximetry and blood pressure cuff 
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
## 2016-08-09 ENCOUNTER — Emergency Department (HOSPITAL_COMMUNITY)
Admission: EM | Admit: 2016-08-09 | Discharge: 2016-08-10 | Disposition: A | Discharge status: Home / Self Care | Payer: Medicare Other | Attending: Emergency Medicine | Admitting: Emergency Medicine

## 2016-08-09 ENCOUNTER — Encounter (HOSPITAL_COMMUNITY): Payer: Self-pay | Admitting: Emergency Medicine

## 2016-08-09 DIAGNOSIS — K529 Noninfective gastroenteritis and colitis, unspecified: Secondary | ICD-10-CM | POA: Diagnosis not present

## 2016-08-09 DIAGNOSIS — R110 Nausea: Secondary | ICD-10-CM

## 2016-08-09 DIAGNOSIS — N183 Chronic kidney disease, stage 3 (moderate): Secondary | ICD-10-CM | POA: Insufficient documentation

## 2016-08-09 DIAGNOSIS — R109 Unspecified abdominal pain: Secondary | ICD-10-CM | POA: Diagnosis present

## 2016-08-09 DIAGNOSIS — I129 Hypertensive chronic kidney disease with stage 1 through stage 4 chronic kidney disease, or unspecified chronic kidney disease: Secondary | ICD-10-CM | POA: Insufficient documentation

## 2016-08-09 LAB — COMPREHENSIVE METABOLIC PANEL
ALT: 16 U/L (ref 14–54)
AST: 20 U/L (ref 15–41)
Albumin: 4.1 g/dL (ref 3.5–5.0)
Alkaline Phosphatase: 95 U/L (ref 38–126)
Anion gap: 11 (ref 5–15)
BUN: 22 mg/dL — ABNORMAL HIGH (ref 6–20)
CO2: 22 mmol/L (ref 22–32)
Calcium: 9.3 mg/dL (ref 8.9–10.3)
Chloride: 101 mmol/L (ref 101–111)
Creatinine, Ser: 1.5 mg/dL — ABNORMAL HIGH (ref 0.44–1.00)
GFR calc Af Amer: 42 mL/min — ABNORMAL LOW (ref >60)
GFR calc non Af Amer: 36 mL/min — ABNORMAL LOW (ref >60)
Glucose, Bld: 106 mg/dL — ABNORMAL HIGH (ref 65–99)
Potassium: 4 mmol/L (ref 3.5–5.1)
Sodium: 134 mmol/L — ABNORMAL LOW (ref 135–145)
Total Bilirubin: 0.4 mg/dL (ref 0.3–1.2)
Total Protein: 7.7 g/dL (ref 6.5–8.1)

## 2016-08-09 LAB — URINALYSIS, ROUTINE W REFLEX MICROSCOPIC
Bilirubin Urine: NEGATIVE
Glucose, UA: NEGATIVE mg/dL
Hgb urine dipstick: NEGATIVE
Ketones, ur: NEGATIVE mg/dL
Nitrite: NEGATIVE
Protein, ur: NEGATIVE mg/dL
Specific Gravity, Urine: 1.011 (ref 1.005–1.030)
pH: 7 (ref 5.0–8.0)

## 2016-08-09 LAB — CBC
HCT: 29.5 % — ABNORMAL LOW (ref 36.0–46.0)
Hemoglobin: 9.6 g/dL — ABNORMAL LOW (ref 12.0–15.0)
MCH: 31.8 pg (ref 26.0–34.0)
MCHC: 32.5 g/dL (ref 30.0–36.0)
MCV: 97.7 fL (ref 78.0–100.0)
Platelets: 199 10*3/uL (ref 150–400)
RBC: 3.02 MIL/uL — ABNORMAL LOW (ref 3.87–5.11)
RDW: 16.2 % — ABNORMAL HIGH (ref 11.5–15.5)
WBC: 5.7 10*3/uL (ref 4.0–10.5)

## 2016-08-09 LAB — LIPASE, BLOOD: Lipase: 21 U/L (ref 11–51)

## 2016-08-09 LAB — URINE MICROSCOPIC-ADD ON

## 2016-08-09 MED ORDER — SODIUM CHLORIDE 0.9 % IV SOLN
1000.0000 mL | INTRAVENOUS | Status: DC
  Administered 2016-08-09: 1000 mL via INTRAVENOUS

## 2016-08-09 MED ORDER — IOPAMIDOL (ISOVUE-300) INJECTION 61%
INTRAVENOUS | Status: AC
  Filled 2016-08-09: qty 30

## 2016-08-09 MED ORDER — MORPHINE SULFATE (PF) 4 MG/ML IV SOLN
4.0000 mg | Freq: Once | INTRAVENOUS | Status: AC
  Administered 2016-08-09: 4 mg via INTRAVENOUS
  Filled 2016-08-09: qty 1

## 2016-08-09 MED ORDER — ONDANSETRON HCL 4 MG/2ML IJ SOLN
4.0000 mg | Freq: Once | INTRAMUSCULAR | Status: AC
  Administered 2016-08-09: 4 mg via INTRAVENOUS
  Filled 2016-08-09: qty 2

## 2016-08-09 MED ORDER — SODIUM CHLORIDE 0.9 % IV SOLN
1000.0000 mL | Freq: Once | INTRAVENOUS | Status: AC
  Administered 2016-08-09: 1000 mL via INTRAVENOUS

## 2016-08-09 NOTE — ED Notes (Signed)
Patient transported to CT 
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
## 2016-08-09 NOTE — ED Notes (Signed)
Returned from CT with bottles of contrast to drink.

## 2016-08-09 NOTE — ED Triage Notes (Signed)
Pt c/o constipation since Wednesday and that she has had increased saliva in her mouth.

## 2016-08-10 ENCOUNTER — Emergency Department (HOSPITAL_COMMUNITY): Payer: Medicare Other

## 2016-08-10 MED ORDER — ONDANSETRON 4 MG PO TBDP: ORAL_TABLET | ORAL | 0 refills | Status: DC

## 2016-08-10 MED ORDER — METRONIDAZOLE 500 MG PO TABS: 500.0000 mg | ORAL_TABLET | Freq: Three times a day (TID) | ORAL | 0 refills | Status: DC

## 2016-08-10 MED ORDER — ONDANSETRON HCL 4 MG/2ML IJ SOLN
4.0000 mg | Freq: Once | INTRAMUSCULAR | Status: AC
  Administered 2016-08-10: 4 mg via INTRAVENOUS
  Filled 2016-08-10: qty 2

## 2016-08-10 MED ORDER — CIPROFLOXACIN HCL 500 MG PO TABS: 500.0000 mg | ORAL_TABLET | Freq: Two times a day (BID) | ORAL | 0 refills | Status: DC

## 2016-08-10 NOTE — ED Notes (Signed)
Family at bedside. 

## 2016-08-10 NOTE — ED Provider Notes (Signed)
Michelle Graves is a 65 y.o. female presents with N/V and LLQ abd pain.  History of diverticulitis. Well appearing on initial provider exam.   Physical Exam  BP (!) 130/103   Pulse 85   Temp 98.5 F (36.9 C) (Oral)   Resp 18   Ht 5' 6.5" (1.689 m)   Wt 89.5 kg   SpO2 97%   BMI 31.37 kg/m   Physical Exam  Constitutional: She appears well-developed and well-nourished. No distress.  HENT:  Head: Normocephalic.  Eyes: Conjunctivae are normal. No scleral icterus.  Neck: Normal range of motion.  Cardiovascular: Normal rate and intact distal pulses.   Pulmonary/Chest: Effort normal.  Abdominal: Soft. There is tenderness in the left lower quadrant. There is no rigidity and no guarding.  Musculoskeletal: Normal range of motion.  Neurological: She is alert.  Skin: Skin is warm and dry.    ED Course  Procedures  Results for orders placed or performed during the hospital encounter of 08/09/16  Lipase, blood  Result Value Ref Range   Lipase 21 11 - 51 U/L  Comprehensive metabolic panel  Result Value Ref Range   Sodium 134 (L) 135 - 145 mmol/L   Potassium 4.0 3.5 - 5.1 mmol/L   Chloride 101 101 - 111 mmol/L   CO2 22 22 - 32 mmol/L   Glucose, Bld 106 (H) 65 - 99 mg/dL   BUN 22 (H) 6 - 20 mg/dL   Creatinine, Ser 0.93 (H) 0.44 - 1.00 mg/dL   Calcium 9.3 8.9 - 23.5 mg/dL   Total Protein 7.7 6.5 - 8.1 g/dL   Albumin 4.1 3.5 - 5.0 g/dL   AST 20 15 - 41 U/L   ALT 16 14 - 54 U/L   Alkaline Phosphatase 95 38 - 126 U/L   Total Bilirubin 0.4 0.3 - 1.2 mg/dL   GFR calc non Af Amer 36 (L) >60 mL/min   GFR calc Af Amer 42 (L) >60 mL/min   Anion gap 11 5 - 15  CBC  Result Value Ref Range   WBC 5.7 4.0 - 10.5 K/uL   RBC 3.02 (L) 3.87 - 5.11 MIL/uL   Hemoglobin 9.6 (L) 12.0 - 15.0 g/dL   HCT 57.3 (L) 22.0 - 25.4 %   MCV 97.7 78.0 - 100.0 fL   MCH 31.8 26.0 - 34.0 pg   MCHC 32.5 30.0 - 36.0 g/dL   RDW 27.0 (H) 62.3 - 76.2 %   Platelets 199 150 - 400 K/uL  Urinalysis, Routine w  reflex microscopic  Result Value Ref Range   Color, Urine AMBER (A) YELLOW   APPearance CLOUDY (A) CLEAR   Specific Gravity, Urine 1.011 1.005 - 1.030   pH 7.0 5.0 - 8.0   Glucose, UA NEGATIVE NEGATIVE mg/dL   Hgb urine dipstick NEGATIVE NEGATIVE   Bilirubin Urine NEGATIVE NEGATIVE   Ketones, ur NEGATIVE NEGATIVE mg/dL   Protein, ur NEGATIVE NEGATIVE mg/dL   Nitrite NEGATIVE NEGATIVE   Leukocytes, UA SMALL (A) NEGATIVE  Urine microscopic-add on  Result Value Ref Range   Squamous Epithelial / LPF 6-30 (A) NONE SEEN   WBC, UA 0-5 0 - 5 WBC/hpf   RBC / HPF 0-5 0 - 5 RBC/hpf   Bacteria, UA RARE (A) NONE SEEN   Ct Abdomen Pelvis Wo Contrast  Result Date: 08/10/2016 CLINICAL DATA:  65 y/o F; 3 days of left lower quadrant pain with nausea and vomiting. EXAM: CT ABDOMEN AND PELVIS WITHOUT CONTRAST TECHNIQUE: Multidetector  CT imaging of the abdomen and pelvis was performed following the standard protocol without IV contrast. COMPARISON:  05/21/2005 CT abdomen pelvis. FINDINGS: Lower chest: Right lung mild fibrotic changes and bronchiectasis. Cluster of ground-glass opacities in the right middle lobe. Mild cardiomegaly. Mild coronary artery calcifications. Hepatobiliary: No focal liver abnormality is seen. No gallstones, gallbladder wall thickening, or biliary dilatation. Focal fatty infiltration along the falciform ligament. Pancreas: Unremarkable. No pancreatic ductal dilatation or surrounding inflammatory changes. Spleen: Normal in size without focal abnormality. Adrenals/Urinary Tract: Adrenal glands are unremarkable. Kidneys are normal, without renal calculi, focal lesion, or hydronephrosis. Bladder is unremarkable. Stomach/Bowel: Moderate size hiatal hernia. Short segment of descending colon in the left lower quadrant with wall thickening and mild surrounding inflammatory changes (series 204, image 121). No evidence for perforation or abscess. No evidence for bowel obstruction. Normal appendix  appear Vascular/Lymphatic: Aortic atherosclerosis. No enlarged abdominal or pelvic lymph nodes. Reproductive: Uterus and bilateral adnexa are unremarkable. Other: No abdominal wall hernia or abnormality. No abdominopelvic ascites. Musculoskeletal: No acute fracture identified. Degenerative grade 1 L4-5 anterolisthesis. Prominent lower lumbar facet arthrosis. Mild osteoarthrosis of bilateral hip joints with acetabular fibrocystic degeneration. IMPRESSION: 1. Short segment of mild colitis of the descending colon in the left lower quadrant. No evidence for perforation or abscess. 2. Right lung base fibrotic changes and bronchiectasis. Ground-glass opacities in middle lobe probably represent an infectious or inflammatory process. 3. Moderate hiatal hernia. Electronically Signed   By: Mitzi Hansen M.D.   On: 08/10/2016 01:57    MDM Plan: CT scan pending.  If normal pt d/c home with nausea medication.    2:39 AM CT scan shows short segment of mild colitis. We'll discharge home with Cipro and Flagyl. Patient continues to complain of nausea. Redose of Zofran given here in the emergency department before discharge.  Abdomen remains mildly tender but is without rebound or guarding.  Patient and family are comfortable with discharge home.  1. Colitis   2. Nausea      Dierdre Forth, PA-C 08/10/16 [phone]

## 2016-08-10 NOTE — Discharge Instructions (Signed)
1. Medications: zofran, Cipro, Flagyl, usual home medications °2. Treatment: rest, drink plenty of fluids, advance diet slowly °3. Follow Up: Please followup with your primary doctor in 2 days for discussion of your diagnoses and further evaluation after today's visit; if you do not have a primary care doctor use the resource guide provided to find one; Please return to the ER for persistent vomiting, high fevers or worsening symptoms ° ° ° °

## 2016-08-10 NOTE — ED Notes (Signed)
Patient is alert and orientedx4.  Patient was explained discharge instructions and they understood them with no questions.   

## 2016-08-10 NOTE — ED Provider Notes (Signed)
[facility]-EMERGENCY DEPT Provider Note   CSN: [id number] Arrival date & time: 08/09/16  1501     History   Chief Complaint Chief Complaint  Patient presents with  . Constipation    HPI Michelle Graves is a 65 y.o. female.  The history is provided by the patient.  Patient presents emergency department complaining of 3 days of worsening left-sided abdominal pain with associated nausea vomiting.  She denies urinary symptoms.  She reports some constipation.  She states this feels similar to her prior diverticulitis.  No fevers or chills at home.  No vomiting today.  She denies chest pain or shortness breath.  No back pain or flank pain.  No new vaginal complaints     Past Medical History:  Diagnosis Date  . Chest pain   . Hypertension     Patient Active Problem List   Diagnosis Date Noted  . HYPERTENSION, MALIGNANT ESSENTIAL 06/24/2007  . KIDNEY DISEASE, CHRONIC, STAGE III 06/24/2007  . ARTHRITIS, RHEUMATOID, SEROPOSITIVE 06/24/2007    History reviewed. No pertinent surgical history.  OB History    No data available       Home Medications    Prior to Admission medications   Medication Sig Start Date End Date Taking? Authorizing Provider  amitriptyline (ELAVIL) 100 MG tablet Take 100 mg by mouth at bedtime. 06/16/16  Yes Historical Provider, MD  amLODipine (NORVASC) 10 MG tablet Take 10 mg by mouth daily.   Yes Historical Provider, MD  Calcium Carbonate-Vitamin D (CALCIUM-D PO) Take 1 tablet by mouth daily.   Yes Historical Provider, MD  diclofenac sodium (VOLTAREN) 1 % GEL Apply 1 application topically daily.   Yes Historical Provider, MD  folic acid (FOLVITE) 1 MG tablet Take 1 mg by mouth daily.   Yes Historical Provider, MD  hydrochlorothiazide (HYDRODIURIL) 25 MG tablet Take 25 mg by mouth daily. 06/16/16  Yes Historical Provider, MD  hydroxychloroquine (PLAQUENIL) 200 MG tablet Take 200 mg by mouth daily. 06/16/16  Yes Historical Provider, MD  lisinopril  (PRINIVIL,ZESTRIL) 20 MG tablet Take 20 mg by mouth daily.   Yes Historical Provider, MD  methotrexate (RHEUMATREX) 2.5 MG tablet Take 15 mg by mouth every Monday. 6 tablets 06/16/16  Yes Historical Provider, MD  metoprolol (LOPRESSOR) 50 MG tablet Take 50 mg by mouth 2 (two) times daily. 06/16/16  Yes Historical Provider, MD  Multiple Vitamin (MULTIVITAMIN WITH MINERALS) TABS tablet Take 1 tablet by mouth daily.   Yes Historical Provider, MD  omeprazole (PRILOSEC) 20 MG capsule Take 20 mg by mouth 2 (two) times daily. 06/16/16  Yes Historical Provider, MD  tiZANidine (ZANAFLEX) 2 MG tablet Take 2 mg by mouth 2 (two) times daily. 06/16/16  Yes Historical Provider, MD  traZODone (DESYREL) 100 MG tablet Take 100 mg by mouth at bedtime.   Yes Historical Provider, MD    Family History No family history on file.  Social History Social History  Substance Use Topics  . Smoking status: Never Smoker  . Smokeless tobacco: Never Used  . Alcohol use No     Allergies   Review of patient's allergies indicates no known allergies.   Review of Systems Review of Systems  All other systems reviewed and are negative.    Physical Exam Updated Vital Signs BP (!) 130/103   Pulse 85   Temp 98.5 F (36.9 C) (Oral)   Resp 18   Ht 5' 6.5" (1.689 m)   Wt 197 lb 5 oz (89.5 kg)   SpO2  97%   BMI 31.37 kg/m   Physical Exam  Constitutional: She is oriented to person, place, and time. She appears well-developed and well-nourished. No distress.  HENT:  Head: Normocephalic and atraumatic.  Eyes: EOM are normal.  Neck: Normal range of motion.  Cardiovascular: Normal rate, regular rhythm and normal heart sounds.   Pulmonary/Chest: Effort normal and breath sounds normal.  Abdominal: Soft. She exhibits no distension.  Left lower quadrant tenderness without guarding or rebound  Musculoskeletal: Normal range of motion.  Neurological: She is alert and oriented to person, place, and time.  Skin: Skin is warm  and dry.  Psychiatric: She has a normal mood and affect. Judgment normal.  Nursing note and vitals reviewed.    ED Treatments / Results  Labs (all labs ordered are listed, but only abnormal results are displayed) Labs Reviewed  COMPREHENSIVE METABOLIC PANEL - Abnormal; Notable for the following:       Result Value   Sodium 134 (*)    Glucose, Bld 106 (*)    BUN 22 (*)    Creatinine, Ser 1.50 (*)    GFR calc non Af Amer 36 (*)    GFR calc Af Amer 42 (*)    All other components within normal limits  CBC - Abnormal; Notable for the following:    RBC 3.02 (*)    Hemoglobin 9.6 (*)    HCT 29.5 (*)    RDW 16.2 (*)    All other components within normal limits  URINALYSIS, ROUTINE W REFLEX MICROSCOPIC (NOT AT [facility]) - Abnormal; Notable for the following:    Color, Urine AMBER (*)    APPearance CLOUDY (*)    Leukocytes, UA SMALL (*)    All other components within normal limits  URINE MICROSCOPIC-ADD ON - Abnormal; Notable for the following:    Squamous Epithelial / LPF 6-30 (*)    Bacteria, UA RARE (*)    All other components within normal limits  LIPASE, BLOOD    EKG  EKG Interpretation None       Radiology No results found.  Procedures Procedures (including critical care time)  Medications Ordered in ED Medications  0.9 %  sodium chloride infusion (0 mLs Intravenous Stopped 08/09/16 2044)    Followed by  0.9 %  sodium chloride infusion (1,000 mLs Intravenous New Bag/Given 08/09/16 1924)  iopamidol (ISOVUE-300) 61 % injection (not administered)  morphine 4 MG/ML injection 4 mg (4 mg Intravenous Given 08/09/16 1922)  ondansetron (ZOFRAN) injection 4 mg (4 mg Intravenous Given 08/09/16 1922)     Initial Impression / Assessment and Plan / ED Course  I have reviewed the triage vital signs and the nursing notes.  Pertinent labs & imaging results that were available during my care of the patient were reviewed by me and considered in my medical decision making (see  chart for details).  Clinical Course    Patient will undergo CT imaging at this time to evaluate for diverticulitis as the cause of her left-sided abdominal pain.  Pain and nausea treated.  IV fluids.  Labs without significant abnormality.  Care to Muthers[place]augh, PA to follow up on CT abd/pelvis  Final Clinical Impressions(s) / ED Diagnoses   Final diagnoses:  None    New Prescriptions New Prescriptions   No medications on file     Azalia Bilis, MD 08/10/16 0013

## 2016-08-26 ENCOUNTER — Emergency Department (HOSPITAL_COMMUNITY): Payer: Medicare Other

## 2016-08-26 ENCOUNTER — Encounter (HOSPITAL_COMMUNITY): Payer: Self-pay

## 2016-08-26 ENCOUNTER — Observation Stay (HOSPITAL_COMMUNITY)
Admission: EM | Admit: 2016-08-26 | Discharge: 2016-08-30 | Disposition: A | Discharge status: Home / Self Care | Payer: Medicare Other | Attending: Internal Medicine | Admitting: Internal Medicine

## 2016-08-26 DIAGNOSIS — K5792 Diverticulitis of intestine, part unspecified, without perforation or abscess without bleeding: Secondary | ICD-10-CM | POA: Diagnosis not present

## 2016-08-26 DIAGNOSIS — M069 Rheumatoid arthritis, unspecified: Secondary | ICD-10-CM | POA: Diagnosis not present

## 2016-08-26 DIAGNOSIS — R000 Tachycardia, unspecified: Secondary | ICD-10-CM | POA: Insufficient documentation

## 2016-08-26 DIAGNOSIS — I129 Hypertensive chronic kidney disease with stage 1 through stage 4 chronic kidney disease, or unspecified chronic kidney disease: Secondary | ICD-10-CM | POA: Insufficient documentation

## 2016-08-26 DIAGNOSIS — E876 Hypokalemia: Secondary | ICD-10-CM | POA: Diagnosis not present

## 2016-08-26 DIAGNOSIS — N179 Acute kidney failure, unspecified: Secondary | ICD-10-CM | POA: Diagnosis not present

## 2016-08-26 DIAGNOSIS — I700 Atherosclerosis of aorta: Secondary | ICD-10-CM | POA: Insufficient documentation

## 2016-08-26 DIAGNOSIS — Z79899 Other long term (current) drug therapy: Secondary | ICD-10-CM | POA: Insufficient documentation

## 2016-08-26 DIAGNOSIS — D631 Anemia in chronic kidney disease: Secondary | ICD-10-CM | POA: Diagnosis not present

## 2016-08-26 DIAGNOSIS — J479 Bronchiectasis, uncomplicated: Secondary | ICD-10-CM | POA: Insufficient documentation

## 2016-08-26 DIAGNOSIS — I10 Essential (primary) hypertension: Secondary | ICD-10-CM | POA: Diagnosis present

## 2016-08-26 DIAGNOSIS — I517 Cardiomegaly: Secondary | ICD-10-CM | POA: Diagnosis not present

## 2016-08-26 DIAGNOSIS — N183 Chronic kidney disease, stage 3 unspecified: Secondary | ICD-10-CM | POA: Diagnosis present

## 2016-08-26 DIAGNOSIS — Z8249 Family history of ischemic heart disease and other diseases of the circulatory system: Secondary | ICD-10-CM | POA: Insufficient documentation

## 2016-08-26 DIAGNOSIS — R1084 Generalized abdominal pain: Secondary | ICD-10-CM

## 2016-08-26 DIAGNOSIS — Z833 Family history of diabetes mellitus: Secondary | ICD-10-CM | POA: Insufficient documentation

## 2016-08-26 DIAGNOSIS — R197 Diarrhea, unspecified: Secondary | ICD-10-CM

## 2016-08-26 DIAGNOSIS — K449 Diaphragmatic hernia without obstruction or gangrene: Secondary | ICD-10-CM | POA: Diagnosis not present

## 2016-08-26 DIAGNOSIS — R112 Nausea with vomiting, unspecified: Secondary | ICD-10-CM

## 2016-08-26 HISTORY — DX: Diverticulitis of intestine, part unspecified, without perforation or abscess without bleeding: K57.92

## 2016-08-26 LAB — COMPREHENSIVE METABOLIC PANEL
ALT: 24 U/L (ref 14–54)
AST: 36 U/L (ref 15–41)
Albumin: 4.4 g/dL (ref 3.5–5.0)
Alkaline Phosphatase: 90 U/L (ref 38–126)
Anion gap: 16 — ABNORMAL HIGH (ref 5–15)
BUN: 19 mg/dL (ref 6–20)
CO2: 22 mmol/L (ref 22–32)
Calcium: 9.6 mg/dL (ref 8.9–10.3)
Chloride: 99 mmol/L — ABNORMAL LOW (ref 101–111)
Creatinine, Ser: 1.86 mg/dL — ABNORMAL HIGH (ref 0.44–1.00)
GFR calc Af Amer: 32 mL/min — ABNORMAL LOW (ref >60)
GFR calc non Af Amer: 28 mL/min — ABNORMAL LOW (ref >60)
Glucose, Bld: 128 mg/dL — ABNORMAL HIGH (ref 65–99)
Potassium: 3.2 mmol/L — ABNORMAL LOW (ref 3.5–5.1)
Sodium: 137 mmol/L (ref 135–145)
Total Bilirubin: 0.9 mg/dL (ref 0.3–1.2)
Total Protein: 8.3 g/dL — ABNORMAL HIGH (ref 6.5–8.1)

## 2016-08-26 LAB — CBC
HCT: 31.9 % — ABNORMAL LOW (ref 36.0–46.0)
Hemoglobin: 10.8 g/dL — ABNORMAL LOW (ref 12.0–15.0)
MCH: 32.2 pg (ref 26.0–34.0)
MCHC: 33.9 g/dL (ref 30.0–36.0)
MCV: 95.2 fL (ref 78.0–100.0)
Platelets: 198 10*3/uL (ref 150–400)
RBC: 3.35 MIL/uL — ABNORMAL LOW (ref 3.87–5.11)
RDW: 16.8 % — ABNORMAL HIGH (ref 11.5–15.5)
WBC: 10.7 10*3/uL — ABNORMAL HIGH (ref 4.0–10.5)

## 2016-08-26 LAB — I-STAT TROPONIN, ED: Troponin i, poc: 0.01 ng/mL (ref 0.00–0.08)

## 2016-08-26 LAB — I-STAT CG4 LACTIC ACID, ED: Lactic Acid, Venous: 2.9 mmol/L (ref 0.5–1.9)

## 2016-08-26 LAB — LIPASE, BLOOD: Lipase: 24 U/L (ref 11–51)

## 2016-08-26 MED ORDER — MORPHINE SULFATE (PF) 4 MG/ML IV SOLN
4.0000 mg | Freq: Once | INTRAVENOUS | Status: DC
  Filled 2016-08-26: qty 1

## 2016-08-26 MED ORDER — METRONIDAZOLE IN NACL 5-0.79 MG/ML-% IV SOLN
500.0000 mg | Freq: Once | INTRAVENOUS | Status: AC
  Administered 2016-08-26: 500 mg via INTRAVENOUS
  Filled 2016-08-26: qty 100

## 2016-08-26 MED ORDER — SODIUM CHLORIDE 0.9 % IV BOLUS (SEPSIS)
1000.0000 mL | Freq: Once | INTRAVENOUS | Status: AC
  Administered 2016-08-26: 1000 mL via INTRAVENOUS

## 2016-08-26 MED ORDER — ONDANSETRON HCL 4 MG/2ML IJ SOLN
4.0000 mg | Freq: Once | INTRAMUSCULAR | Status: AC
  Administered 2016-08-26: 4 mg via INTRAVENOUS
  Filled 2016-08-26 (×2): qty 2

## 2016-08-26 MED ORDER — OXYCODONE-ACETAMINOPHEN 5-325 MG PO TABS
2.0000 | ORAL_TABLET | Freq: Once | ORAL | Status: AC
  Administered 2016-08-26: 2 via ORAL
  Filled 2016-08-26: qty 2

## 2016-08-26 MED ORDER — MORPHINE SULFATE (PF) 4 MG/ML IV SOLN
4.0000 mg | Freq: Once | INTRAVENOUS | Status: AC
  Administered 2016-08-26: 4 mg via INTRAVENOUS
  Filled 2016-08-26: qty 1

## 2016-08-26 MED ORDER — IOPAMIDOL (ISOVUE-300) INJECTION 61%
INTRAVENOUS | Status: AC
  Administered 2016-08-26: 75 mL
  Filled 2016-08-26: qty 100

## 2016-08-26 MED ORDER — ONDANSETRON 4 MG PO TBDP
4.0000 mg | ORAL_TABLET | Freq: Once | ORAL | Status: AC
  Administered 2016-08-26: 4 mg via ORAL
  Filled 2016-08-26: qty 1

## 2016-08-26 MED ORDER — CIPROFLOXACIN IN D5W 400 MG/200ML IV SOLN
400.0000 mg | Freq: Once | INTRAVENOUS | Status: AC
  Administered 2016-08-26: 400 mg via INTRAVENOUS
  Filled 2016-08-26: qty 200

## 2016-08-26 NOTE — ED Notes (Signed)
Patient transported to CT via stretcher with transporter  

## 2016-08-26 NOTE — ED Provider Notes (Signed)
[facility]-EMERGENCY DEPT Provider Note   CSN: [id number] Arrival date & time: 08/26/16  1641     History   Chief Complaint Chief Complaint  Patient presents with  . Abdominal Pain    HPI Michelle Graves is a 65 y.o. female.  Patient presents with 2-3 days of generalized lower abdominal pain/cramping, vomiting, and diarrhea. Reports her pain is moderate in severity and described as a cramping, generalized to her LLQ and RLQ and non-radiating. She notes no exacerbating or alleviating factors. Has had difficulty keeping down PO due to her symptoms. Denies any fevers though does report feeling chills. States she has a history of diverticulitis and feels this is similar. Denies chest pain or dyspnea. No recent abdominal surgeries.   The history is provided by the patient. No language interpreter was used.    Past Medical History:  Diagnosis Date  . Chest pain   . Diverticulitis   . Hypertension     Patient Active Problem List   Diagnosis Date Noted  . Sinus tachycardia 08/27/2016  . Diverticulitis 08/26/2016  . HYPERTENSION, MALIGNANT ESSENTIAL 06/24/2007  . KIDNEY DISEASE, CHRONIC, STAGE III 06/24/2007  . ARTHRITIS, RHEUMATOID, SEROPOSITIVE 06/24/2007    No past surgical history on file.  OB History    No data available       Home Medications    Prior to Admission medications   Medication Sig Start Date End Date Taking? Authorizing Provider  amitriptyline (ELAVIL) 100 MG tablet Take 100 mg by mouth at bedtime. 06/16/16  Yes Historical Provider, MD  amLODipine (NORVASC) 10 MG tablet Take 10 mg by mouth daily.   Yes Historical Provider, MD  Calcium Carbonate-Vitamin D (CALCIUM-D PO) Take 1 tablet by mouth daily.   Yes Historical Provider, MD  folic acid (FOLVITE) 1 MG tablet Take 1 mg by mouth daily.   Yes Historical Provider, MD  hydrochlorothiazide (HYDRODIURIL) 25 MG tablet Take 25 mg by mouth daily. 06/16/16  Yes Historical Provider, MD  hydroxychloroquine  (PLAQUENIL) 200 MG tablet Take 200 mg by mouth 2 (two) times daily.  06/16/16  Yes Historical Provider, MD  lisinopril (PRINIVIL,ZESTRIL) 20 MG tablet Take 20 mg by mouth daily.   Yes Historical Provider, MD  methotrexate (RHEUMATREX) 2.5 MG tablet Take 15 mg by mouth every Monday. 6 tablets 06/16/16  Yes Historical Provider, MD  metoprolol (LOPRESSOR) 50 MG tablet Take 50 mg by mouth 2 (two) times daily. 06/16/16  Yes Historical Provider, MD  Multiple Vitamin (MULTIVITAMIN WITH MINERALS) TABS tablet Take 1 tablet by mouth daily.   Yes Historical Provider, MD  omeprazole (PRILOSEC) 20 MG capsule Take 20 mg by mouth 2 (two) times daily. 06/16/16  Yes Historical Provider, MD  traZODone (DESYREL) 100 MG tablet Take 100 mg by mouth at bedtime.   Yes Historical Provider, MD  zolpidem (AMBIEN) 10 MG tablet Take 10 mg by mouth at bedtime as needed for sleep.   Yes Historical Provider, MD    Family History No family history on file.  Social History Social History  Substance Use Topics  . Smoking status: Never Smoker  . Smokeless tobacco: Never Used  . Alcohol use No     Allergies   Review of patient's allergies indicates no known allergies.   Review of Systems Review of Systems  Constitutional: Positive for chills and fatigue. Negative for fever.  HENT: Negative.   Respiratory: Negative for stridor.   Cardiovascular: Negative for chest pain.  Gastrointestinal: Positive for abdominal pain, diarrhea, nausea and vomiting.  Negative for blood in stool.  Genitourinary: Positive for decreased urine volume. Negative for dysuria.  Musculoskeletal: Negative.   Skin: Negative.   Allergic/Immunologic: Negative for immunocompromised state.  Neurological: Negative.   Hematological: Does not bruise/bleed easily.  Psychiatric/Behavioral: Negative.      Physical Exam Updated Vital Signs BP 118/81 (BP Location: Left Arm)   Pulse 83   Temp 98 F (36.7 C) (Oral)   Resp 18   Ht 5\' 7"  (1.702 m)   Wt  86.6 kg   SpO2 100%   BMI 29.91 kg/m   Physical Exam  Constitutional: She is oriented to person, place, and time. She appears well-developed and well-nourished. No distress.  HENT:  Head: Normocephalic and atraumatic.  Eyes: Conjunctivae and EOM are normal. No scleral icterus.  Neck: Normal range of motion. Neck supple.  Cardiovascular: Regular rhythm, S1 normal, S2 normal and normal heart sounds.  Tachycardia present.  Exam reveals no gallop and no friction rub.   No murmur heard. Pulmonary/Chest: Effort normal and breath sounds normal. No respiratory distress. She has no decreased breath sounds. She has no wheezes. She has no rales.  Abdominal: Soft. Bowel sounds are normal. There is tenderness in the right lower quadrant, left upper quadrant and left lower quadrant. There is guarding. There is no rigidity and no rebound.  Neurological: She is alert and oriented to person, place, and time.  Skin: Skin is warm and dry. Capillary refill takes less than 2 seconds. She is not diaphoretic.  Psychiatric: She has a normal mood and affect. Her behavior is normal. Judgment and thought content normal.     ED Treatments / Results  Labs (all labs ordered are listed, but only abnormal results are displayed) Labs Reviewed  COMPREHENSIVE METABOLIC PANEL - Abnormal; Notable for the following:       Result Value   Potassium 3.2 (*)    Chloride 99 (*)    Glucose, Bld 128 (*)    Creatinine, Ser 1.86 (*)    Total Protein 8.3 (*)    GFR calc non Af Amer 28 (*)    GFR calc Af Amer 32 (*)    Anion gap 16 (*)    All other components within normal limits  CBC - Abnormal; Notable for the following:    WBC 10.7 (*)    RBC 3.35 (*)    Hemoglobin 10.8 (*)    HCT 31.9 (*)    RDW 16.8 (*)    All other components within normal limits  URINALYSIS, ROUTINE W REFLEX MICROSCOPIC (NOT AT [facility]) - Abnormal; Notable for the following:    Specific Gravity, Urine 1.037 (*)    Ketones, ur 15 (*)    Leukocytes,  UA TRACE (*)    All other components within normal limits  URINE MICROSCOPIC-ADD ON - Abnormal; Notable for the following:    Squamous Epithelial / LPF 0-5 (*)    Bacteria, UA RARE (*)    Casts HYALINE CASTS (*)    All other components within normal limits  PROTIME-INR - Abnormal; Notable for the following:    Prothrombin Time 16.1 (*)    All other components within normal limits  CBC - Abnormal; Notable for the following:    WBC 10.6 (*)    RBC 2.74 (*)    Hemoglobin 8.7 (*)    HCT 26.3 (*)    RDW 16.8 (*)    Platelets 147 (*)    All other components within normal limits  COMPREHENSIVE METABOLIC PANEL -  Abnormal; Notable for the following:    Potassium 3.2 (*)    CO2 21 (*)    Glucose, Bld 102 (*)    Creatinine, Ser 1.29 (*)    Calcium 7.7 (*)    Total Protein 6.2 (*)    Albumin 3.2 (*)    GFR calc non Af Amer 43 (*)    GFR calc Af Amer 50 (*)    All other components within normal limits  I-STAT CG4 LACTIC ACID, ED - Abnormal; Notable for the following:    Lactic Acid, Venous 2.90 (*)    All other components within normal limits  CULTURE, BLOOD (ROUTINE X 2)  CULTURE, BLOOD (ROUTINE X 2)  LIPASE, BLOOD  LACTIC ACID, PLASMA  PROCALCITONIN  APTT  I-STAT TROPOININ, ED    EKG  EKG Interpretation  Date/Time:  Tuesday August 26 2016 17:04:30 EDT Ventricular Rate:  143 PR Interval:  116 QRS Duration: 88 QT Interval:  356 QTC Calculation: 549 R Axis:   20 Text Interpretation:  Sinus tachycardia Nonspecific ST and T wave abnormality Confirmed by Anitra Lauth  MD, WHITNEY ([id number]) on 08/26/2016 6:10:56 PM       Radiology Dg Chest 2 View  Result Date: 08/26/2016 CLINICAL DATA:  Right-sided abdominal pain emesis and diarrhea EXAM: CHEST  2 VIEW COMPARISON:  02/21/1999 and FINDINGS: No acute infiltrate, consolidation or effusion. Small focus of atelectasis near the right CP angle. Stable cardiomediastinal silhouette with tortuous and unfolded aorta. Small calcified right  upper lobe lung nodules. No pneumothorax. IMPRESSION: Small focus of right CP angle atelectasis. No acute infiltrate or edema. Electronically Signed   By: Jasmine Pang M.D.   On: 08/26/2016 18:34   Ct Abdomen Pelvis W Contrast  Result Date: 08/26/2016 CLINICAL DATA:  Right lower quadrant pain for 2 days, history of diverticulitis EXAM: CT ABDOMEN AND PELVIS WITH CONTRAST TECHNIQUE: Multidetector CT imaging of the abdomen and pelvis was performed using the standard protocol following bolus administration of intravenous contrast. CONTRAST:  68mL ISOVUE-300 IOPAMIDOL (ISOVUE-300) INJECTION 61% COMPARISON:  08/10/2016 FINDINGS: Lower chest: Chronic changes in the lung bases are noted. The previously seen infiltrative change on the right has cleared. Hepatobiliary: No focal liver abnormality is seen. No gallstones, gallbladder wall thickening, or biliary dilatation. Pancreas: Unremarkable. No pancreatic ductal dilatation or surrounding inflammatory changes. Spleen: Normal in size without focal abnormality. Adrenals/Urinary Tract: Adrenal glands are unremarkable. Kidneys are normal, without renal calculi, focal lesion, or hydronephrosis. Bladder is unremarkable. Stomach/Bowel: Stomach is within normal limits. Appendix appears normal. There remains wall thickening and mild pericolonic inflammatory change in the descending and proximal sigmoid colons consistent with diverticulitis. The overall appearance is stable. No perforation or abscess is seen. Vascular/Lymphatic: No significant vascular findings are present. No enlarged abdominal or pelvic lymph nodes. Reproductive: Uterus and bilateral adnexa are unremarkable. Other: No abdominal wall hernia or abnormality. No abdominopelvic ascites. Musculoskeletal: Degenerative changes of lumbar spine are seen. IMPRESSION: Changes consistent with diverticulitis in the distal descending and proximal sigmoid colon. No abscess or perforation is noted at this time.  Normal-appearing appendix. Electronically Signed   By: Alcide Clever M.D.   On: 08/26/2016 21:13    Procedures Procedures (including critical care time)  Medications Ordered in ED Medications  zolpidem (AMBIEN) tablet 10 mg (not administered)  amitriptyline (ELAVIL) tablet 100 mg (not administered)  pantoprazole (PROTONIX) EC tablet 40 mg (40 mg Oral Given 08/27/16 1047)  traZODone (DESYREL) tablet 100 mg (not administered)  amLODipine (NORVASC) tablet 10 mg (10  mg Oral Given 08/27/16 1047)  enoxaparin (LOVENOX) injection 40 mg (not administered)  sodium chloride flush (NS) 0.9 % injection 3 mL (3 mLs Intravenous Not Given 08/27/16 1049)  0.9 %  sodium chloride infusion ( Intravenous Transfusing/Transfer 08/27/16 1156)  acetaminophen (TYLENOL) tablet 650 mg (not administered)    Or  acetaminophen (TYLENOL) suppository 650 mg (not administered)  HYDROcodone-acetaminophen (NORCO/VICODIN) 5-325 MG per tablet 1-2 tablet (not administered)  ondansetron (ZOFRAN) tablet 4 mg (not administered)    Or  ondansetron (ZOFRAN) injection 4 mg (not administered)  metroNIDAZOLE (FLAGYL) IVPB 500 mg (0 mg Intravenous Stopped 08/27/16 0931)  ciprofloxacin (CIPRO) IVPB 400 mg (0 mg Intravenous Stopped 08/27/16 1154)  metoprolol tartrate (LOPRESSOR) tablet 25 mg (25 mg Oral Given 08/27/16 0829)  alum & mag hydroxide-simeth (MAALOX/MYLANTA) 200-200-20 MG/5ML suspension 15 mL (15 mLs Oral Given 08/27/16 1225)  HYDROmorphone (DILAUDID) injection 0.5 mg (not administered)  sodium chloride 0.9 % bolus 1,000 mL (0 mLs Intravenous Stopped 08/26/16 2239)  ondansetron (ZOFRAN) injection 4 mg (4 mg Intravenous Given 08/26/16 2345)  iopamidol (ISOVUE-300) 61 % injection (75 mLs  Contrast Given 08/26/16 2050)  ondansetron (ZOFRAN-ODT) disintegrating tablet 4 mg (4 mg Oral Given 08/26/16 1918)  oxyCODONE-acetaminophen (PERCOCET/ROXICET) 5-325 MG per tablet 2 tablet (2 tablets Oral Given 08/26/16 1917)  sodium chloride 0.9 %  bolus 1,000 mL (0 mLs Intravenous Stopped 08/27/16 0225)  metroNIDAZOLE (FLAGYL) IVPB 500 mg (0 mg Intravenous Stopped 08/27/16 0022)  ciprofloxacin (CIPRO) IVPB 400 mg (0 mg Intravenous Stopped 08/26/16 2308)  morphine 4 MG/ML injection 4 mg (4 mg Intravenous Given 08/26/16 2345)  sodium chloride 0.9 % bolus 1,000 mL (0 mLs Intravenous Stopped 08/27/16 0342)  potassium chloride SA (K-DUR,KLOR-CON) CR tablet 40 mEq (40 mEq Oral Given 08/27/16 0404)  potassium chloride SA (K-DUR,KLOR-CON) CR tablet 40 mEq (40 mEq Oral Given 08/27/16 7673)     Initial Impression / Assessment and Plan / ED Course  I have reviewed the triage vital signs and the nursing notes.  Pertinent labs & imaging results that were available during my care of the patient were reviewed by me and considered in my medical decision making (see chart for details).  Clinical Course    Patient presents with two to three days of nausea, vomiting, diarrhea, and lower abdominal cramping. On arrival she is afebrile but tachycardic to the 120s, normotensive. She is alert and oriented, protecting her airway. Exam reveals generalized lower abdominal tenderness without peritoneal signs. She was given 1L of NS initially without improvement in heart rate. Symptoms improved with PO analgesics and antiemetics. CT abdomen/pelvis reveals uncomplicated diverticulitis without perforation or abscess. She was started on flagyl and cipro and blood cultures were drawn. She was given another 1L of NS and continued to be tachycardic. Lactic acid ordered and she will be admitted for sepsis in the setting of uncomplicated diverticulitis. She is stable and appropriate for floor admission. Will be admitted to hospitalist.  Final Clinical Impressions(s) / ED Diagnoses   Final diagnoses:  Diverticulitis of intestine without perforation or abscess without bleeding, unspecified part of intestinal tract  Generalized abdominal pain  Nausea vomiting and diarrhea     New Prescriptions Current Discharge Medication List       Preston Fleeting, MD 08/27/16 1521    Gwyneth Sprout, MD 08/27/16 1738

## 2016-08-26 NOTE — ED Triage Notes (Signed)
Pt reports abd pain that began yesterday. She reports emesis and diarrhea. Pt states hx of diverticulitis. Pt crying in triage and skin color appears pale.

## 2016-08-26 NOTE — ED Notes (Signed)
Phleb at bedside collecting BCx2 

## 2016-08-26 NOTE — ED Notes (Signed)
Pt unable to urinate at this time. Pt will try later. 

## 2016-08-27 DIAGNOSIS — A419 Sepsis, unspecified organism: Secondary | ICD-10-CM

## 2016-08-27 DIAGNOSIS — R000 Tachycardia, unspecified: Secondary | ICD-10-CM

## 2016-08-27 DIAGNOSIS — K5720 Diverticulitis of large intestine with perforation and abscess without bleeding: Secondary | ICD-10-CM | POA: Diagnosis not present

## 2016-08-27 DIAGNOSIS — I10 Essential (primary) hypertension: Secondary | ICD-10-CM | POA: Diagnosis not present

## 2016-08-27 DIAGNOSIS — N183 Chronic kidney disease, stage 3 (moderate): Secondary | ICD-10-CM | POA: Diagnosis not present

## 2016-08-27 DIAGNOSIS — N179 Acute kidney failure, unspecified: Secondary | ICD-10-CM

## 2016-08-27 DIAGNOSIS — M069 Rheumatoid arthritis, unspecified: Secondary | ICD-10-CM

## 2016-08-27 LAB — CBC
HCT: 26.3 % — ABNORMAL LOW (ref 36.0–46.0)
Hemoglobin: 8.7 g/dL — ABNORMAL LOW (ref 12.0–15.0)
MCH: 31.8 pg (ref 26.0–34.0)
MCHC: 33.1 g/dL (ref 30.0–36.0)
MCV: 96 fL (ref 78.0–100.0)
Platelets: 147 10*3/uL — ABNORMAL LOW (ref 150–400)
RBC: 2.74 MIL/uL — ABNORMAL LOW (ref 3.87–5.11)
RDW: 16.8 % — ABNORMAL HIGH (ref 11.5–15.5)
WBC: 10.6 10*3/uL — ABNORMAL HIGH (ref 4.0–10.5)

## 2016-08-27 LAB — URINALYSIS, ROUTINE W REFLEX MICROSCOPIC
Bilirubin Urine: NEGATIVE
Glucose, UA: NEGATIVE mg/dL
Hgb urine dipstick: NEGATIVE
Ketones, ur: 15 mg/dL — AB
Nitrite: NEGATIVE
Protein, ur: NEGATIVE mg/dL
Specific Gravity, Urine: 1.037 — ABNORMAL HIGH (ref 1.005–1.030)
pH: 6 (ref 5.0–8.0)

## 2016-08-27 LAB — LACTIC ACID, PLASMA: Lactic Acid, Venous: 0.9 mmol/L (ref 0.5–1.9)

## 2016-08-27 LAB — PROCALCITONIN: Procalcitonin: 0.12 ng/mL

## 2016-08-27 LAB — COMPREHENSIVE METABOLIC PANEL WITH GFR
ALT: 20 U/L (ref 14–54)
AST: 24 U/L (ref 15–41)
Albumin: 3.2 g/dL — ABNORMAL LOW (ref 3.5–5.0)
Alkaline Phosphatase: 61 U/L (ref 38–126)
Anion gap: 9 (ref 5–15)
BUN: 15 mg/dL (ref 6–20)
CO2: 21 mmol/L — ABNORMAL LOW (ref 22–32)
Calcium: 7.7 mg/dL — ABNORMAL LOW (ref 8.9–10.3)
Chloride: 107 mmol/L (ref 101–111)
Creatinine, Ser: 1.29 mg/dL — ABNORMAL HIGH (ref 0.44–1.00)
GFR calc Af Amer: 50 mL/min — ABNORMAL LOW
GFR calc non Af Amer: 43 mL/min — ABNORMAL LOW
Glucose, Bld: 102 mg/dL — ABNORMAL HIGH (ref 65–99)
Potassium: 3.2 mmol/L — ABNORMAL LOW (ref 3.5–5.1)
Sodium: 137 mmol/L (ref 135–145)
Total Bilirubin: 0.8 mg/dL (ref 0.3–1.2)
Total Protein: 6.2 g/dL — ABNORMAL LOW (ref 6.5–8.1)

## 2016-08-27 LAB — URINE MICROSCOPIC-ADD ON: RBC / HPF: NONE SEEN RBC/hpf (ref 0–5)

## 2016-08-27 LAB — APTT: aPTT: 31 s (ref 24–36)

## 2016-08-27 LAB — PROTIME-INR
INR: 1.28
Prothrombin Time: 16.1 seconds — ABNORMAL HIGH (ref 11.4–15.2)

## 2016-08-27 MED ORDER — METOPROLOL TARTRATE 25 MG PO TABS: 25.0000 mg | ORAL_TABLET | Freq: Two times a day (BID) | ORAL | Status: DC

## 2016-08-27 MED ORDER — PANTOPRAZOLE SODIUM 40 MG PO TBEC
40.0000 mg | DELAYED_RELEASE_TABLET | Freq: Every day | ORAL | Status: DC
  Administered 2016-08-27 – 2016-08-30 (×4): 40 mg via ORAL
  Filled 2016-08-27 (×4): qty 1

## 2016-08-27 MED ORDER — SODIUM CHLORIDE 0.9% FLUSH
3.0000 mL | Freq: Two times a day (BID) | INTRAVENOUS | Status: DC
  Administered 2016-08-28 – 2016-08-30 (×5): 3 mL via INTRAVENOUS

## 2016-08-27 MED ORDER — AMITRIPTYLINE HCL 25 MG PO TABS
100.0000 mg | ORAL_TABLET | Freq: Every day | ORAL | Status: DC
  Administered 2016-08-27 – 2016-08-29 (×3): 100 mg via ORAL
  Filled 2016-08-27 (×3): qty 4

## 2016-08-27 MED ORDER — POTASSIUM CHLORIDE CRYS ER 20 MEQ PO TBCR
40.0000 meq | EXTENDED_RELEASE_TABLET | Freq: Once | ORAL | Status: AC
  Administered 2016-08-27: 40 meq via ORAL
  Filled 2016-08-27: qty 2

## 2016-08-27 MED ORDER — HYDROCODONE-ACETAMINOPHEN 5-325 MG PO TABS
1.0000 | ORAL_TABLET | ORAL | Status: DC | PRN
  Administered 2016-08-27 – 2016-08-29 (×3): 2 via ORAL
  Administered 2016-08-30: 1 via ORAL
  Filled 2016-08-27 (×3): qty 2
  Filled 2016-08-27: qty 1

## 2016-08-27 MED ORDER — HYDROMORPHONE HCL 2 MG/ML IJ SOLN
0.5000 mg | INTRAMUSCULAR | Status: DC | PRN
  Administered 2016-08-27: 0.5 mg via INTRAVENOUS
  Filled 2016-08-27: qty 1

## 2016-08-27 MED ORDER — AMLODIPINE BESYLATE 10 MG PO TABS
10.0000 mg | ORAL_TABLET | Freq: Every day | ORAL | Status: DC
  Administered 2016-08-27 – 2016-08-30 (×4): 10 mg via ORAL
  Filled 2016-08-27: qty 1
  Filled 2016-08-27: qty 2
  Filled 2016-08-27 (×2): qty 1

## 2016-08-27 MED ORDER — METOPROLOL TARTRATE 25 MG PO TABS
25.0000 mg | ORAL_TABLET | Freq: Two times a day (BID) | ORAL | Status: DC
  Administered 2016-08-27 – 2016-08-30 (×7): 25 mg via ORAL
  Filled 2016-08-27 (×7): qty 1

## 2016-08-27 MED ORDER — ONDANSETRON HCL 4 MG/2ML IJ SOLN: 4.0000 mg | Freq: Four times a day (QID) | INTRAMUSCULAR | Status: DC | PRN

## 2016-08-27 MED ORDER — ONDANSETRON HCL 4 MG PO TABS: 4.0000 mg | ORAL_TABLET | Freq: Four times a day (QID) | ORAL | Status: DC | PRN

## 2016-08-27 MED ORDER — ALUM & MAG HYDROXIDE-SIMETH 200-200-20 MG/5ML PO SUSP
15.0000 mL | ORAL | Status: DC | PRN
  Administered 2016-08-27: 15 mL via ORAL
  Filled 2016-08-27: qty 30

## 2016-08-27 MED ORDER — CIPROFLOXACIN IN D5W 400 MG/200ML IV SOLN
400.0000 mg | Freq: Two times a day (BID) | INTRAVENOUS | Status: DC
  Administered 2016-08-27 – 2016-08-29 (×6): 400 mg via INTRAVENOUS
  Filled 2016-08-27 (×6): qty 200

## 2016-08-27 MED ORDER — HYDROMORPHONE HCL 1 MG/ML IJ SOLN: 0.5000 mg | INTRAMUSCULAR | Status: DC | PRN

## 2016-08-27 MED ORDER — PIPERACILLIN-TAZOBACTAM 3.375 G IVPB 30 MIN: 3.3750 g | Freq: Once | INTRAVENOUS | Status: DC

## 2016-08-27 MED ORDER — ACETAMINOPHEN 650 MG RE SUPP: 650.0000 mg | Freq: Four times a day (QID) | RECTAL | Status: DC | PRN

## 2016-08-27 MED ORDER — METRONIDAZOLE IN NACL 5-0.79 MG/ML-% IV SOLN
500.0000 mg | Freq: Three times a day (TID) | INTRAVENOUS | Status: DC
  Administered 2016-08-27 – 2016-08-28 (×4): 500 mg via INTRAVENOUS
  Filled 2016-08-27 (×4): qty 100

## 2016-08-27 MED ORDER — ACETAMINOPHEN 325 MG PO TABS: 650.0000 mg | ORAL_TABLET | Freq: Four times a day (QID) | ORAL | Status: DC | PRN

## 2016-08-27 MED ORDER — SODIUM CHLORIDE 0.9 % IV BOLUS (SEPSIS)
1000.0000 mL | Freq: Once | INTRAVENOUS | Status: AC
  Administered 2016-08-27: 1000 mL via INTRAVENOUS

## 2016-08-27 MED ORDER — ENOXAPARIN SODIUM 40 MG/0.4ML ~~LOC~~ SOLN
40.0000 mg | Freq: Every day | SUBCUTANEOUS | Status: DC
  Administered 2016-08-27 – 2016-08-30 (×4): 40 mg via SUBCUTANEOUS
  Filled 2016-08-27 (×5): qty 0.4

## 2016-08-27 MED ORDER — SODIUM CHLORIDE 0.9 % IV SOLN
INTRAVENOUS | Status: DC
  Administered 2016-08-27 (×2): via INTRAVENOUS

## 2016-08-27 MED ORDER — ZOLPIDEM TARTRATE 5 MG PO TABS: 10.0000 mg | ORAL_TABLET | Freq: Every evening | ORAL | Status: DC | PRN

## 2016-08-27 MED ORDER — TRAZODONE HCL 100 MG PO TABS
100.0000 mg | ORAL_TABLET | Freq: Every day | ORAL | Status: DC
  Administered 2016-08-27 – 2016-08-29 (×3): 100 mg via ORAL
  Filled 2016-08-27 (×3): qty 1

## 2016-08-27 NOTE — ED Notes (Signed)
Pt ambulated to the bathroom w/o any issues.  

## 2016-08-27 NOTE — ED Notes (Addendum)
[name] is 65 yo female in with diverticulitis and has not c/o pain until now when she c/o indigestion. Dr. Benjamine Mola has been contacted and states she will order something for indigestion. Pt has ambulated to br with steady gait and has gotten her morning meds and iv ABX of flagyl and cipro.22 iv at left fa by IV team with fluids infusing.

## 2016-08-27 NOTE — ED Notes (Signed)
Pt received lying in bed. resp  even and non labored . Skin warm and dry. Denies c/o at present. Liquid diet at bedside but pt declines.

## 2016-08-27 NOTE — ED Notes (Signed)
Pt oob and c/o abdominal pain along with indigestion. Medicated for same.

## 2016-08-27 NOTE — Care Management Obs Status (Signed)
MEDICARE OBSERVATION STATUS NOTIFICATION   Patient Details  Name: Michelle Graves MRN: [id number] Date of Birth: 12/09/1950   Medicare Observation Status Notification Given:  Yes (explained obs letter to patient and her daughter, CM answered questions)    Antony Haste, RN 08/27/2016, 7:51 PM

## 2016-08-27 NOTE — Progress Notes (Signed)
Patient admitted after midnight, please see H&P.  Continue clear and IV abx for diverticulosis  Marlin Canary DO

## 2016-08-27 NOTE — H&P (Signed)
History and Physical    Michelle Graves ZOX:[id number] DOB: 09/13/1951 DOA: 08/26/2016  PCP: No primary care provider on file.   Patient coming from: Home  Chief Complaint: Nausea, vomiting, abdominal pain, diarrhea  HPI: Michelle Graves is a 65 y.o.woman with a history of Rheumatoid arthritis (on plaquenil and methotrexate at baseline), HTN, and prior episode of diverticulitis in 2015 (hospitalized for one week, eventually had outpatient colonoscopy with GI doctor in [place]) who presents to the ED for evaluation of two days of progressive nausea, vomiting, and abdominal pain.  She has had nonbloody diarrhea.  Emesis has had streaks of blood present, but that just happened today.  She has not had any documented fever.  No chills or sweats.  No light-headedness.  She has had increased weakness but no syncope.  Decreased PO intake.  Symptoms were similar to her last presentation with diverticulitis, so she presented to the ED for further evaluation.  ED Course: No fever but she has had sinus tachycardia.  WBC count 10.7.  K 3.2.  Creatinine 1.86.  CT abdomen and pelvis shows changes consistent with diverticulitis in the descending and sigmoid colon.  No abscess or perforation noted.  Lactic acid level 2.9.  A total of 3L of NS ordered in the ED.  She has received IV cipro and flagyl.  Hospitalist asked to admit.  Review of Systems: As per HPI otherwise 10 point review of systems negative.    Past Medical History:  Diagnosis Date  . Chest pain   . Diverticulitis   . Hypertension   Rheumatoid arthritis  No past surgical history on file.  She denies any major surgeries in the past.  She has had a colonoscopy.   reports that she has never smoked. She has never used smokeless tobacco. She reports that she does not drink alcohol or use drugs.  She is divorced.  She has two adult children.  Her daughter is her POA.  No Known Allergies  Family History: Diabetes and hypertension are  prevalent.  Prior to Admission medications   Medication Sig Start Date End Date Taking? Authorizing Provider  amitriptyline (ELAVIL) 100 MG tablet Take 100 mg by mouth at bedtime. 06/16/16  Yes Historical Provider, MD  amLODipine (NORVASC) 10 MG tablet Take 10 mg by mouth daily.   Yes Historical Provider, MD  Calcium Carbonate-Vitamin D (CALCIUM-D PO) Take 1 tablet by mouth daily.   Yes Historical Provider, MD  folic acid (FOLVITE) 1 MG tablet Take 1 mg by mouth daily.   Yes Historical Provider, MD  hydrochlorothiazide (HYDRODIURIL) 25 MG tablet Take 25 mg by mouth daily. 06/16/16  Yes Historical Provider, MD  hydroxychloroquine (PLAQUENIL) 200 MG tablet Take 200 mg by mouth 2 (two) times daily.  06/16/16  Yes Historical Provider, MD  lisinopril (PRINIVIL,ZESTRIL) 20 MG tablet Take 20 mg by mouth daily.   Yes Historical Provider, MD  methotrexate (RHEUMATREX) 2.5 MG tablet Take 15 mg by mouth every Monday. 6 tablets 06/16/16  Yes Historical Provider, MD  metoprolol (LOPRESSOR) 50 MG tablet Take 50 mg by mouth 2 (two) times daily. 06/16/16  Yes Historical Provider, MD  Multiple Vitamin (MULTIVITAMIN WITH MINERALS) TABS tablet Take 1 tablet by mouth daily.   Yes Historical Provider, MD  omeprazole (PRILOSEC) 20 MG capsule Take 20 mg by mouth 2 (two) times daily. 06/16/16  Yes Historical Provider, MD  traZODone (DESYREL) 100 MG tablet Take 100 mg by mouth at bedtime.   Yes Historical Provider, MD  zolpidem (AMBIEN) 10 MG tablet Take 10 mg by mouth at bedtime as needed for sleep.   Yes Historical Provider, MD    Physical Exam: Vitals:   08/27/16 0000 08/27/16 0015 08/27/16 0030 08/27/16 0045  BP: 122/88 139/87 133/92 132/88  Pulse: 104 100 104 118  Resp: 14 11 15 16   Temp:      TempSrc:      SpO2: 100% 97% 99% 100%  Weight:      Height:          Constitutional: NAD, ill appearing Vitals:   08/27/16 0000 08/27/16 0015 08/27/16 0030 08/27/16 0045  BP: 122/88 139/87 133/92 132/88  Pulse: 104  100 104 118  Resp: 14 11 15 16   Temp:      TempSrc:      SpO2: 100% 97% 99% 100%  Weight:      Height:       Eyes: PERRL, lids and conjunctivae normal ENMT: Mucous membranes are dry. Posterior pharynx clear of any exudate or lesions. Normal dentition.  Neck: normal appearance, supple Respiratory: clear to auscultation bilaterally, no wheezing, no crackles. Normal respiratory effort. No accessory muscle use.  Cardiovascular: Tachycardic but regular.  No murmurs / rubs / gallops. No extremity edema. 2+ pedal pulses.   GI: abdomen is mildly distended, she actually tender in the right lower quadrant.  No guarding.  No masses palpated.  Bowel sounds are present. Musculoskeletal:  No joint deformity in upper and lower extremities. Good ROM, no contractures. Normal muscle tone.  Skin: no rashes, warm and dry Neurologic: No focal deficits. Psychiatric: Normal judgment and insight. Alert and oriented x 3. Normal mood.     Labs on Admission: I have personally reviewed following labs and imaging studies  CBC:  Recent Labs Lab 08/26/16 1725  WBC 10.7*  HGB 10.8*  HCT 31.9*  MCV 95.2  PLT 198   Basic Metabolic Panel:  Recent Labs Lab 08/26/16 1725  NA 137  K 3.2*  CL 99*  CO2 22  GLUCOSE 128*  BUN 19  CREATININE 1.86*  CALCIUM 9.6   GFR: Estimated Creatinine Clearance: 35 mL/min (by C-G formula based on SCr of 1.86 mg/dL (H)). Liver Function Tests:  Recent Labs Lab 08/26/16 1725  AST 36  ALT 24  ALKPHOS 90  BILITOT 0.9  PROT 8.3*  ALBUMIN 4.4    Recent Labs Lab 08/26/16 1725  LIPASE 24   Urine analysis:    Component Value Date/Time   COLORURINE YELLOW 08/26/2016 2343   APPEARANCEUR CLEAR 08/26/2016 2343   LABSPEC 1.037 (H) 08/26/2016 2343   PHURINE 6.0 08/26/2016 2343   GLUCOSEU NEGATIVE 08/26/2016 2343   HGBUR NEGATIVE 08/26/2016 2343   BILIRUBINUR NEGATIVE 08/26/2016 2343   KETONESUR 15 (A) 08/26/2016 2343   PROTEINUR NEGATIVE 08/26/2016 2343    UROBILINOGEN 1.0 06/24/2010 1850   NITRITE NEGATIVE 08/26/2016 2343   LEUKOCYTESUR TRACE (A) 08/26/2016 2343   Sepsis Labs:  Lactic acid level 2.9  Radiological Exams on Admission: Dg Chest 2 View  Result Date: 08/26/2016 CLINICAL DATA:  Right-sided abdominal pain emesis and diarrhea EXAM: CHEST  2 VIEW COMPARISON:  02/21/1999 and FINDINGS: No acute infiltrate, consolidation or effusion. Small focus of atelectasis near the right CP angle. Stable cardiomediastinal silhouette with tortuous and unfolded aorta. Small calcified right upper lobe lung nodules. No pneumothorax. IMPRESSION: Small focus of right CP angle atelectasis. No acute infiltrate or edema. Electronically Signed   By: Jasmine Pang M.D.   On: 08/26/2016 18:34  Ct Abdomen Pelvis W Contrast  Result Date: 08/26/2016 CLINICAL DATA:  Right lower quadrant pain for 2 days, history of diverticulitis EXAM: CT ABDOMEN AND PELVIS WITH CONTRAST TECHNIQUE: Multidetector CT imaging of the abdomen and pelvis was performed using the standard protocol following bolus administration of intravenous contrast. CONTRAST:  81mL ISOVUE-300 IOPAMIDOL (ISOVUE-300) INJECTION 61% COMPARISON:  08/10/2016 FINDINGS: Lower chest: Chronic changes in the lung bases are noted. The previously seen infiltrative change on the right has cleared. Hepatobiliary: No focal liver abnormality is seen. No gallstones, gallbladder wall thickening, or biliary dilatation. Pancreas: Unremarkable. No pancreatic ductal dilatation or surrounding inflammatory changes. Spleen: Normal in size without focal abnormality. Adrenals/Urinary Tract: Adrenal glands are unremarkable. Kidneys are normal, without renal calculi, focal lesion, or hydronephrosis. Bladder is unremarkable. Stomach/Bowel: Stomach is within normal limits. Appendix appears normal. There remains wall thickening and mild pericolonic inflammatory change in the descending and proximal sigmoid colons consistent with diverticulitis.  The overall appearance is stable. No perforation or abscess is seen. Vascular/Lymphatic: No significant vascular findings are present. No enlarged abdominal or pelvic lymph nodes. Reproductive: Uterus and bilateral adnexa are unremarkable. Other: No abdominal wall hernia or abnormality. No abdominopelvic ascites. Musculoskeletal: Degenerative changes of lumbar spine are seen. IMPRESSION: Changes consistent with diverticulitis in the distal descending and proximal sigmoid colon. No abscess or perforation is noted at this time. Normal-appearing appendix. Electronically Signed   By: Alcide Clever M.D.   On: 08/26/2016 21:13    EKG: Independently reviewed. Sinus tachycardia.  No significant ST segment changes.  Assessment/Plan Principal Problem:   Diverticulitis Active Problems:   HYPERTENSION, MALIGNANT ESSENTIAL   KIDNEY DISEASE, CHRONIC, STAGE III   Sinus tachycardia  Rheumatoid arthritis Hypokalemia   Acute diverticulitis causing sepsis --Continue IV cipro and flagyl for now --Blood cultures --Repeat lactic acid level in 3 hours, check procalcitonin --S/P 3L NS in the ED which meets the 30cc/kg recommendation per sepsis protocol.  Continue maintenance fluids. --HOLD plaquenil and methotrexate in the setting of acute illness --Decrease metoprolol dose for now due to relative hypotension in the ED --Anti-emetics and analgesics as needed --Clear liquid diet for now  History of HTN --HOLD lisinopril and HCTZ due to mild AKI on CKD 3 --Continue metoprolol at reduced dose for now until sepsis improves --Continue amlodipine  AKI on CKD 3, baseline creatinine 1.2-1.5 --S/P aggressive volume resuscitation --Holding diuretic and ARB  --Repeat BMP in the AM --U/A does not appear to show infection  History of RA --HOLD plaquenil and methotrexate for now in the setting of acute illness.  Sinus tachycardia --Improving with management of sepsis, continue telemetry monitoring for  now  Hypokalemia --Replacement ordered, repeat BMP in the AM  DVT prophylaxis: Lovenox Code Status: FULL Family Communication: Patient alone in the ED at time of admission. Disposition Plan: To be determined. Consults called: NONE Admission status: Observation, telemetry.  Expect she will need to convert to inpatient.  Lactic acid elevated and persistent tachycardia consistent with sepsis.  Lactic acid level was not available at the time of my initial assessment.  With her history of RA and treatment with plaquenil and methotrexate, she is immunocompromised and considered high risk for complications related to infection.   TIME SPENT: 70 minutes   Jerene Bears MD [facility] Pager [phone]  If 7PM-7AM, please contact night-coverage [email or web address] Password [email or web address]  08/27/2016, 1:17 AM

## 2016-08-28 ENCOUNTER — Encounter (HOSPITAL_COMMUNITY): Payer: Self-pay | Admitting: *Deleted

## 2016-08-28 DIAGNOSIS — K5720 Diverticulitis of large intestine with perforation and abscess without bleeding: Secondary | ICD-10-CM | POA: Diagnosis not present

## 2016-08-28 DIAGNOSIS — N183 Chronic kidney disease, stage 3 (moderate): Secondary | ICD-10-CM | POA: Diagnosis not present

## 2016-08-28 DIAGNOSIS — I10 Essential (primary) hypertension: Secondary | ICD-10-CM | POA: Diagnosis not present

## 2016-08-28 LAB — BASIC METABOLIC PANEL
Anion gap: 4 — ABNORMAL LOW (ref 5–15)
BUN: 8 mg/dL (ref 6–20)
CO2: 23 mmol/L (ref 22–32)
Calcium: 7.8 mg/dL — ABNORMAL LOW (ref 8.9–10.3)
Chloride: 109 mmol/L (ref 101–111)
Creatinine, Ser: 1 mg/dL (ref 0.44–1.00)
GFR calc Af Amer: >60 mL/min (ref >60)
GFR calc non Af Amer: 59 mL/min — ABNORMAL LOW (ref >60)
Glucose, Bld: 89 mg/dL (ref 65–99)
Potassium: 3.7 mmol/L (ref 3.5–5.1)
Sodium: 136 mmol/L (ref 135–145)

## 2016-08-28 LAB — CBC
HCT: 23.2 % — ABNORMAL LOW (ref 36.0–46.0)
Hemoglobin: 7.7 g/dL — ABNORMAL LOW (ref 12.0–15.0)
MCH: 31.6 pg (ref 26.0–34.0)
MCHC: 33.2 g/dL (ref 30.0–36.0)
MCV: 95.1 fL (ref 78.0–100.0)
Platelets: 162 10*3/uL (ref 150–400)
RBC: 2.44 MIL/uL — ABNORMAL LOW (ref 3.87–5.11)
RDW: 16.6 % — ABNORMAL HIGH (ref 11.5–15.5)
WBC: 6.3 10*3/uL (ref 4.0–10.5)

## 2016-08-28 MED ORDER — METRONIDAZOLE 500 MG PO TABS
500.0000 mg | ORAL_TABLET | Freq: Three times a day (TID) | ORAL | Status: DC
  Administered 2016-08-28 – 2016-08-30 (×7): 500 mg via ORAL
  Filled 2016-08-28 (×8): qty 1

## 2016-08-28 NOTE — Progress Notes (Signed)
PROGRESS NOTE    Michelle Graves  TKW:[id number] DOB: [birth year] DOA: 08/26/2016 PCP: No primary care provider on file.   Outpatient Specialists:    Brief Narrative:  Michelle Graves is a 65 y.o.woman with a history of Rheumatoid arthritis (on plaquenil and methotrexate at baseline), HTN, and prior episode of diverticulitis in 2015 (hospitalized for one week, eventually had outpatient colonoscopy with GI doctor in [place]) who presents to the ED for evaluation of two days of progressive nausea, vomiting, and abdominal pain.  She has had nonbloody diarrhea.  Emesis has had streaks of blood present, but that just happened today.  She has not had any documented fever.  No chills or sweats.  No light-headedness.  She has had increased weakness but no syncope.  Decreased PO intake.  Symptoms were similar to her last presentation with diverticulitis, so she presented to the ED for further evaluation.   Assessment & Plan:   Principal Problem:   Diverticulitis Active Problems:   HYPERTENSION, MALIGNANT ESSENTIAL   KIDNEY DISEASE, CHRONIC, STAGE III   Sinus tachycardia   Acute diverticulitis  -Continue IV cipro and flagyl for now -Blood cultures -HOLD plaquenil and methotrexate in the setting of acute illness -Decrease metoprolol dose for now due to relative hypotension in the ED -Anti-emetics and analgesics as needed -advance diet as tolerated  History of HTN -HOLD lisinopril and HCTZ due to mild AKI on CKD 3 -Continue metoprolol at reduced dose for now  -Continue amlodipine  AKI on CKD 3, baseline creatinine 1.2-1.5 -S/P aggressive volume resuscitation -Holding diuretic and ARB  -Repeat BMP in the AM  History of RA -HOLD plaquenil and methotrexate for now in the setting of acute illness.  Sinus tachycardia -resolved with IVF  Hypokalemia -Replacement ordered -BMP in the AM  Anemia -?volume dilution -check B12, iron studies -heme test stools   DVT  prophylaxis:  Lovenox   Code Status: Full Code   Family Communication: patient  Disposition Plan:  Home when tolerating PO and H/H stable   Consultants:        Subjective: C/o nausea but pain better  Objective: Vitals:   08/27/16 1740 08/27/16 2145 08/28/16 0202 08/28/16 0510  BP: 106/69 113/78 119/80 103/71  Pulse: 80 89 91 83  Resp: 18 18 18 18   Temp: 97.6 F (36.4 C) 97.8 F (36.6 C) 97.9 F (36.6 C) 98.4 F (36.9 C)  TempSrc: Oral Oral Oral Oral  SpO2: 94% 100% 100% 100%  Weight:      Height:        Intake/Output Summary (Last 24 hours) at 08/28/16 0839 Last data filed at 08/28/16 0400  Gross per 24 hour  Intake          2483.75 ml  Output                0 ml  Net          2483.75 ml   Filed Weights   08/26/16 1650  Weight: 86.6 kg (191 lb)    Examination:  General exam: Appears calm and comfortable  Respiratory system: Clear to auscultation. Respiratory effort normal. Cardiovascular system: S1 & S2 heard, RRR. No JVD, murmurs, rubs, gallops or clicks. No pedal edema. Gastrointestinal system: Abdomen is nondistended, soft and nontender. No organomegaly or masses felt. Normal bowel sounds heard.    Data Reviewed: I have personally reviewed following labs and imaging studies  CBC:  Recent Labs Lab 08/26/16 1725 08/27/16 0327 08/28/16 0425  WBC 10.7*  10.6* 6.3  HGB 10.8* 8.7* 7.7*  HCT 31.9* 26.3* 23.2*  MCV 95.2 96.0 95.1  PLT 198 147* 162   Basic Metabolic Panel:  Recent Labs Lab 08/26/16 1725 08/27/16 0327 08/28/16 0425  NA 137 137 136  K 3.2* 3.2* 3.7  CL 99* 107 109  CO2 22 21* 23  GLUCOSE 128* 102* 89  BUN 19 15 8   CREATININE 1.86* 1.29* 1.00  CALCIUM 9.6 7.7* 7.8*   GFR: Estimated Creatinine Clearance: 65.1 mL/min (by C-G formula based on SCr of 1 mg/dL). Liver Function Tests:  Recent Labs Lab 08/26/16 1725 08/27/16 0327  AST 36 24  ALT 24 20  ALKPHOS 90 61  BILITOT 0.9 0.8  PROT 8.3* 6.2*  ALBUMIN 4.4  3.2*    Recent Labs Lab 08/26/16 1725  LIPASE 24   No results for input(s): AMMONIA in the last 168 hours. Coagulation Profile:  Recent Labs Lab 08/27/16 0250  INR 1.28   Cardiac Enzymes: No results for input(s): CKTOTAL, CKMB, CKMBINDEX, TROPONINI in the last 168 hours. BNP (last 3 results) No results for input(s): PROBNP in the last 8760 hours. HbA1C: No results for input(s): HGBA1C in the last 72 hours. CBG: No results for input(s): GLUCAP in the last 168 hours. Lipid Profile: No results for input(s): CHOL, HDL, LDLCALC, TRIG, CHOLHDL, LDLDIRECT in the last 72 hours. Thyroid Function Tests: No results for input(s): TSH, T4TOTAL, FREET4, T3FREE, THYROIDAB in the last 72 hours. Anemia Panel: No results for input(s): VITAMINB12, FOLATE, FERRITIN, TIBC, IRON, RETICCTPCT in the last 72 hours. Urine analysis:    Component Value Date/Time   COLORURINE YELLOW 08/26/2016 2343   APPEARANCEUR CLEAR 08/26/2016 2343   LABSPEC 1.037 (H) 08/26/2016 2343   PHURINE 6.0 08/26/2016 2343   GLUCOSEU NEGATIVE 08/26/2016 2343   HGBUR NEGATIVE 08/26/2016 2343   BILIRUBINUR NEGATIVE 08/26/2016 2343   KETONESUR 15 (A) 08/26/2016 2343   PROTEINUR NEGATIVE 08/26/2016 2343   UROBILINOGEN 1.0 06/24/2010 1850   NITRITE NEGATIVE 08/26/2016 2343   LEUKOCYTESUR TRACE (A) 08/26/2016 2343     ) Recent Results (from the past 240 hour(s))  Blood culture (routine x 2)     Status: None (Preliminary result)   Collection Time: 08/26/16  8:55 PM  Result Value Ref Range Status   Specimen Description BLOOD LEFT HAND  Final   Special Requests BOTTLES DRAWN AEROBIC AND ANAEROBIC 5CC  Final   Culture NO GROWTH 2 DAYS  Final   Report Status PENDING  Incomplete  Blood culture (routine x 2)     Status: None (Preliminary result)   Collection Time: 08/26/16  9:54 PM  Result Value Ref Range Status   Specimen Description BLOOD RIGHT THUMB  Final   Special Requests IN PEDIATRIC BOTTLE 1CC  Final   Culture  NO GROWTH 2 DAYS  Final   Report Status PENDING  Incomplete      Anti-infectives    Start     Dose/Rate Route Frequency Ordered Stop   08/27/16 1000  ciprofloxacin (CIPRO) IVPB 400 mg    Comments:  Cipro 400 mg IV q12h for CrCl > 30 mL/min   400 mg 200 mL/hr over 60 Minutes Intravenous Every 12 hours 08/27/16 0305     08/27/16 0300  piperacillin-tazobactam (ZOSYN) IVPB 3.375 g  Status:  Discontinued     3.375 g 100 mL/hr over 30 Minutes Intravenous  Once 08/27/16 0249 08/27/16 0306   08/27/16 0300  metroNIDAZOLE (FLAGYL) IVPB 500 mg     500  mg 100 mL/hr over 60 Minutes Intravenous Every 8 hours 08/27/16 0249     08/26/16 2145  metroNIDAZOLE (FLAGYL) IVPB 500 mg     500 mg 100 mL/hr over 60 Minutes Intravenous  Once 08/26/16 2138 08/27/16 0022   08/26/16 2145  ciprofloxacin (CIPRO) IVPB 400 mg     400 mg 200 mL/hr over 60 Minutes Intravenous  Once 08/26/16 2138 08/26/16 2308       Radiology Studies: Dg Chest 2 View  Result Date: 08/26/2016 CLINICAL DATA:  Right-sided abdominal pain emesis and diarrhea EXAM: CHEST  2 VIEW COMPARISON:  02/21/1999 and FINDINGS: No acute infiltrate, consolidation or effusion. Small focus of atelectasis near the right CP angle. Stable cardiomediastinal silhouette with tortuous and unfolded aorta. Small calcified right upper lobe lung nodules. No pneumothorax. IMPRESSION: Small focus of right CP angle atelectasis. No acute infiltrate or edema. Electronically Signed   By: Jasmine Pang M.D.   On: 08/26/2016 18:34   Ct Abdomen Pelvis W Contrast  Result Date: 08/26/2016 CLINICAL DATA:  Right lower quadrant pain for 2 days, history of diverticulitis EXAM: CT ABDOMEN AND PELVIS WITH CONTRAST TECHNIQUE: Multidetector CT imaging of the abdomen and pelvis was performed using the standard protocol following bolus administration of intravenous contrast. CONTRAST:  51mL ISOVUE-300 IOPAMIDOL (ISOVUE-300) INJECTION 61% COMPARISON:  08/10/2016 FINDINGS: Lower chest:  Chronic changes in the lung bases are noted. The previously seen infiltrative change on the right has cleared. Hepatobiliary: No focal liver abnormality is seen. No gallstones, gallbladder wall thickening, or biliary dilatation. Pancreas: Unremarkable. No pancreatic ductal dilatation or surrounding inflammatory changes. Spleen: Normal in size without focal abnormality. Adrenals/Urinary Tract: Adrenal glands are unremarkable. Kidneys are normal, without renal calculi, focal lesion, or hydronephrosis. Bladder is unremarkable. Stomach/Bowel: Stomach is within normal limits. Appendix appears normal. There remains wall thickening and mild pericolonic inflammatory change in the descending and proximal sigmoid colons consistent with diverticulitis. The overall appearance is stable. No perforation or abscess is seen. Vascular/Lymphatic: No significant vascular findings are present. No enlarged abdominal or pelvic lymph nodes. Reproductive: Uterus and bilateral adnexa are unremarkable. Other: No abdominal wall hernia or abnormality. No abdominopelvic ascites. Musculoskeletal: Degenerative changes of lumbar spine are seen. IMPRESSION: Changes consistent with diverticulitis in the distal descending and proximal sigmoid colon. No abscess or perforation is noted at this time. Normal-appearing appendix. Electronically Signed   By: Alcide Clever M.D.   On: 08/26/2016 21:13        Scheduled Meds: . amitriptyline  100 mg Oral QHS  . amLODipine  10 mg Oral Daily  . ciprofloxacin  400 mg Intravenous Q12H  . enoxaparin (LOVENOX) injection  40 mg Subcutaneous Daily  . metoprolol  25 mg Oral BID  . metronidazole  500 mg Intravenous Q8H  . pantoprazole  40 mg Oral Daily  . sodium chloride flush  3 mL Intravenous Q12H  . traZODone  100 mg Oral QHS   Continuous Infusions:    LOS: 0 days    Time spent: 25 min    Lily Kernen Juanetta Gosling, DO [facility] Pager [phone]  If 7PM-7AM, please contact  night-coverage [email or web address] Password [facility] 08/28/2016, 8:39 AM

## 2016-08-29 DIAGNOSIS — N183 Chronic kidney disease, stage 3 (moderate): Secondary | ICD-10-CM | POA: Diagnosis not present

## 2016-08-29 DIAGNOSIS — K5720 Diverticulitis of large intestine with perforation and abscess without bleeding: Secondary | ICD-10-CM

## 2016-08-29 DIAGNOSIS — K5792 Diverticulitis of intestine, part unspecified, without perforation or abscess without bleeding: Secondary | ICD-10-CM | POA: Diagnosis not present

## 2016-08-29 LAB — PREPARE RBC (CROSSMATCH)

## 2016-08-29 LAB — BASIC METABOLIC PANEL
Anion gap: 7 (ref 5–15)
BUN: <5 mg/dL — ABNORMAL LOW (ref 6–20)
CO2: 22 mmol/L (ref 22–32)
Calcium: 8.3 mg/dL — ABNORMAL LOW (ref 8.9–10.3)
Chloride: 109 mmol/L (ref 101–111)
Creatinine, Ser: 1.01 mg/dL — ABNORMAL HIGH (ref 0.44–1.00)
GFR calc Af Amer: >60 mL/min (ref >60)
GFR calc non Af Amer: 58 mL/min — ABNORMAL LOW (ref >60)
Glucose, Bld: 88 mg/dL (ref 65–99)
Potassium: 3.7 mmol/L (ref 3.5–5.1)
Sodium: 138 mmol/L (ref 135–145)

## 2016-08-29 LAB — HEMOGLOBIN AND HEMATOCRIT, BLOOD
HCT: 27.8 % — ABNORMAL LOW (ref 36.0–46.0)
Hemoglobin: 9.4 g/dL — ABNORMAL LOW (ref 12.0–15.0)

## 2016-08-29 LAB — CBC
HCT: 23.6 % — ABNORMAL LOW (ref 36.0–46.0)
Hemoglobin: 7.8 g/dL — ABNORMAL LOW (ref 12.0–15.0)
MCH: 31 pg (ref 26.0–34.0)
MCHC: 33.1 g/dL (ref 30.0–36.0)
MCV: 93.7 fL (ref 78.0–100.0)
Platelets: 198 10*3/uL (ref 150–400)
RBC: 2.52 MIL/uL — ABNORMAL LOW (ref 3.87–5.11)
RDW: 16.5 % — ABNORMAL HIGH (ref 11.5–15.5)
WBC: 4.2 10*3/uL (ref 4.0–10.5)

## 2016-08-29 LAB — ABO/RH: ABO/RH(D): O POS

## 2016-08-29 LAB — IRON AND TIBC
Iron: 61 ug/dL (ref 28–170)
Saturation Ratios: 27 % (ref 10.4–31.8)
TIBC: 227 ug/dL — ABNORMAL LOW (ref 250–450)
UIBC: 166 ug/dL

## 2016-08-29 LAB — FERRITIN: Ferritin: 555 ng/mL — ABNORMAL HIGH (ref 11–307)

## 2016-08-29 MED ORDER — SODIUM CHLORIDE 0.9 % IV SOLN
Freq: Once | INTRAVENOUS | Status: AC
  Administered 2016-08-29: 12:00:00 via INTRAVENOUS

## 2016-08-29 NOTE — Progress Notes (Addendum)
PROGRESS NOTE    Michelle Graves  TKP:[id number] DOB: 09-26-1951 DOA: 08/26/2016 PCP: No primary care provider on file.   Outpatient Specialists: Brief Narrative:  Michelle Graves is a 65 y.o.woman with a history of Rheumatoid arthritis (on plaquenil and methotrexate at baseline), HTN, and prior episode of diverticulitis in 2015 (hospitalized for one week, eventually had outpatient colonoscopy with GI doctor in [place]) who presents to the ED for evaluation of two days of progressive nausea, vomiting, and abdominal pain.  Found to have Acute diverticulitis  Assessment & Plan:  Acute diverticulitis  -improving, slowly -Continue IV cipro and flagyl Day 3 -continue full liquids today -supportive care -h/o colonoscopy in 2015 was normal except for diverticulosis per pt   Anemia  -due to chronic disease, dilution, meds- methotrexate and plaquenil -will transfuse 1 unit pBRC -no overt bleeding -anemia panel suggestive of chronic disease    HTN -continue amlodipine and metoprolol -holding ARB  AKI on CKD 3, baseline creatinine 1.2-1.5 -resolved with hydration, ARB on hold  History of RA -Holding plaquenil and methotrexate in the setting of acute infection  Hypokalemia -Replaced  DVT prophylaxis: Lovenox  Code Status:Full Code Family Communication:patient Disposition Plan: Home when tolerating PO and H/H stable   Consultants:       Subjective: C/o nausea but pain better, feels weak overall, no bleeding  Objective: Vitals:   08/29/16 0136 08/29/16 0539 08/29/16 0900 08/29/16 1258  BP: 110/74 120/79 118/69 120/83  Pulse: 82 82 91 93  Resp: 18 18 20    Temp: 98.8 F (37.1 C) 98.7 F (37.1 C) 98.7 F (37.1 C) 97.3 F (36.3 C)  TempSrc: Oral Oral Oral   SpO2: 97% 100% 100% 100%  Weight:      Height:        Intake/Output Summary (Last 24 hours) at 08/29/16 1302 Last data filed at 08/28/16 2115  Gross per 24 hour  Intake              440 ml    Output                0 ml  Net              440 ml   Filed Weights   08/26/16 1650  Weight: 86.6 kg (191 lb)    Examination:  General exam: Appears calm and comfortable, AAOx3, no distress Respiratory system: Clear to auscultation. Respiratory effort normal. Cardiovascular system: S1 & S2 heard, RRR. No JVD, murmurs, rubs, gallops or clicks. No pedal edema. Gastrointestinal system: Abdomen is nondistended, soft and mild lower quadrant tenderness. No organomegaly or masses felt. Normal bowel sounds heard. Neurology: non focal Ext: no edema c/c   Data Reviewed: I have personally reviewed following labs and imaging studies  CBC:  Recent Labs Lab 08/26/16 1725 08/27/16 0327 08/28/16 0425 08/29/16 0224  WBC 10.7* 10.6* 6.3 4.2  HGB 10.8* 8.7* 7.7* 7.8*  HCT 31.9* 26.3* 23.2* 23.6*  MCV 95.2 96.0 95.1 93.7  PLT 198 147* 162 198   Basic Metabolic Panel:  Recent Labs Lab 08/26/16 1725 08/27/16 0327 08/28/16 0425 08/29/16 0224  NA 137 137 136 138  K 3.2* 3.2* 3.7 3.7  CL 99* 107 109 109  CO2 22 21* 23 22  GLUCOSE 128* 102* 89 88  BUN 19 15 8  <5*  CREATININE 1.86* 1.29* 1.00 1.01*  CALCIUM 9.6 7.7* 7.8* 8.3*   GFR: Estimated Creatinine Clearance: 64.4 mL/min (by C-G formula based on SCr of 1.01  mg/dL (H)). Liver Function Tests:  Recent Labs Lab 08/26/16 1725 08/27/16 0327  AST 36 24  ALT 24 20  ALKPHOS 90 61  BILITOT 0.9 0.8  PROT 8.3* 6.2*  ALBUMIN 4.4 3.2*    Recent Labs Lab 08/26/16 1725  LIPASE 24   No results for input(s): AMMONIA in the last 168 hours. Coagulation Profile:  Recent Labs Lab 08/27/16 0250  INR 1.28   Cardiac Enzymes: No results for input(s): CKTOTAL, CKMB, CKMBINDEX, TROPONINI in the last 168 hours. BNP (last 3 results) No results for input(s): PROBNP in the last 8760 hours. HbA1C: No results for input(s): HGBA1C in the last 72 hours. CBG: No results for input(s): GLUCAP in the last 168 hours. Lipid Profile: No  results for input(s): CHOL, HDL, LDLCALC, TRIG, CHOLHDL, LDLDIRECT in the last 72 hours. Thyroid Function Tests: No results for input(s): TSH, T4TOTAL, FREET4, T3FREE, THYROIDAB in the last 72 hours. Anemia Panel:  Recent Labs  08/29/16 0224  FERRITIN 555*  TIBC 227*  IRON 61   Urine analysis:    Component Value Date/Time   COLORURINE YELLOW 08/26/2016 2343   APPEARANCEUR CLEAR 08/26/2016 2343   LABSPEC 1.037 (H) 08/26/2016 2343   PHURINE 6.0 08/26/2016 2343   GLUCOSEU NEGATIVE 08/26/2016 2343   HGBUR NEGATIVE 08/26/2016 2343   BILIRUBINUR NEGATIVE 08/26/2016 2343   KETONESUR 15 (A) 08/26/2016 2343   PROTEINUR NEGATIVE 08/26/2016 2343   UROBILINOGEN 1.0 06/24/2010 1850   NITRITE NEGATIVE 08/26/2016 2343   LEUKOCYTESUR TRACE (A) 08/26/2016 2343     ) Recent Results (from the past 240 hour(s))  Blood culture (routine x 2)     Status: None (Preliminary result)   Collection Time: 08/26/16  8:55 PM  Result Value Ref Range Status   Specimen Description BLOOD LEFT HAND  Final   Special Requests BOTTLES DRAWN AEROBIC AND ANAEROBIC 5CC  Final   Culture NO GROWTH 2 DAYS  Final   Report Status PENDING  Incomplete  Blood culture (routine x 2)     Status: None (Preliminary result)   Collection Time: 08/26/16  9:54 PM  Result Value Ref Range Status   Specimen Description BLOOD RIGHT THUMB  Final   Special Requests IN PEDIATRIC BOTTLE 1CC  Final   Culture NO GROWTH 2 DAYS  Final   Report Status PENDING  Incomplete      Anti-infectives    Start     Dose/Rate Route Frequency Ordered Stop   08/28/16 1030  metroNIDAZOLE (FLAGYL) tablet 500 mg     500 mg Oral Every 8 hours 08/28/16 1017     08/27/16 1000  ciprofloxacin (CIPRO) IVPB 400 mg    Comments:  Cipro 400 mg IV q12h for CrCl > 30 mL/min   400 mg 200 mL/hr over 60 Minutes Intravenous Every 12 hours 08/27/16 0305     08/27/16 0300  piperacillin-tazobactam (ZOSYN) IVPB 3.375 g  Status:  Discontinued     3.375 g 100 mL/hr  over 30 Minutes Intravenous  Once 08/27/16 0249 08/27/16 0306   08/27/16 0300  metroNIDAZOLE (FLAGYL) IVPB 500 mg  Status:  Discontinued     500 mg 100 mL/hr over 60 Minutes Intravenous Every 8 hours 08/27/16 0249 08/28/16 1017   08/26/16 2145  metroNIDAZOLE (FLAGYL) IVPB 500 mg     500 mg 100 mL/hr over 60 Minutes Intravenous  Once 08/26/16 2138 08/27/16 0022   08/26/16 2145  ciprofloxacin (CIPRO) IVPB 400 mg     400 mg 200 mL/hr over 60  Minutes Intravenous  Once 08/26/16 2138 08/26/16 2308       Radiology Studies: No results found.      Scheduled Meds: . amitriptyline  100 mg Oral QHS  . amLODipine  10 mg Oral Daily  . ciprofloxacin  400 mg Intravenous Q12H  . enoxaparin (LOVENOX) injection  40 mg Subcutaneous Daily  . metoprolol  25 mg Oral BID  . metroNIDAZOLE  500 mg Oral Q8H  . pantoprazole  40 mg Oral Daily  . sodium chloride flush  3 mL Intravenous Q12H  . traZODone  100 mg Oral QHS   Continuous Infusions:    LOS: 0 days    Time spent: 25 min    Lilybelle Mayeda, MD [facility] Pager [phone]  If 7PM-7AM, please contact night-coverage [email or web address] Password [email or web address] 08/29/2016, 1:02 PM

## 2016-08-30 DIAGNOSIS — K5792 Diverticulitis of intestine, part unspecified, without perforation or abscess without bleeding: Secondary | ICD-10-CM | POA: Diagnosis not present

## 2016-08-30 DIAGNOSIS — N183 Chronic kidney disease, stage 3 (moderate): Secondary | ICD-10-CM | POA: Diagnosis not present

## 2016-08-30 DIAGNOSIS — K5720 Diverticulitis of large intestine with perforation and abscess without bleeding: Secondary | ICD-10-CM | POA: Diagnosis not present

## 2016-08-30 LAB — TYPE AND SCREEN
ABO/RH(D): O POS
Antibody Screen: NEGATIVE
Unit division: 0

## 2016-08-30 LAB — BASIC METABOLIC PANEL
Anion gap: 3 — ABNORMAL LOW (ref 5–15)
BUN: <5 mg/dL — ABNORMAL LOW (ref 6–20)
CO2: 22 mmol/L (ref 22–32)
Calcium: 8.4 mg/dL — ABNORMAL LOW (ref 8.9–10.3)
Chloride: 108 mmol/L (ref 101–111)
Creatinine, Ser: 0.99 mg/dL (ref 0.44–1.00)
GFR calc Af Amer: >60 mL/min (ref >60)
GFR calc non Af Amer: 59 mL/min — ABNORMAL LOW (ref >60)
Glucose, Bld: 93 mg/dL (ref 65–99)
Potassium: 3.6 mmol/L (ref 3.5–5.1)
Sodium: 133 mmol/L — ABNORMAL LOW (ref 135–145)

## 2016-08-30 LAB — CBC
HCT: 26.9 % — ABNORMAL LOW (ref 36.0–46.0)
Hemoglobin: 9.2 g/dL — ABNORMAL LOW (ref 12.0–15.0)
MCH: 31.2 pg (ref 26.0–34.0)
MCHC: 34.2 g/dL (ref 30.0–36.0)
MCV: 91.2 fL (ref 78.0–100.0)
Platelets: 207 10*3/uL (ref 150–400)
RBC: 2.95 MIL/uL — ABNORMAL LOW (ref 3.87–5.11)
RDW: 17.6 % — ABNORMAL HIGH (ref 11.5–15.5)
WBC: 3.9 10*3/uL — ABNORMAL LOW (ref 4.0–10.5)

## 2016-08-30 MED ORDER — CIPROFLOXACIN HCL 500 MG PO TABS
500.0000 mg | ORAL_TABLET | Freq: Two times a day (BID) | ORAL | Status: DC
  Administered 2016-08-30: 500 mg via ORAL
  Filled 2016-08-30: qty 1

## 2016-08-30 MED ORDER — CIPROFLOXACIN HCL 500 MG PO TABS: 500.0000 mg | ORAL_TABLET | Freq: Two times a day (BID) | ORAL | 0 refills | Status: DC

## 2016-08-30 MED ORDER — METRONIDAZOLE 500 MG PO TABS: 500.0000 mg | ORAL_TABLET | Freq: Three times a day (TID) | ORAL | 0 refills | Status: DC

## 2016-08-30 NOTE — Progress Notes (Signed)
Pt was discharged home. Discharge instruction were reviewed with the patient, and also daughter, who was at the bedside. Pt verbalized understanding.  Lawernce Ion, RN  08/30/2016 3:18 PM

## 2016-08-31 LAB — CULTURE, BLOOD (ROUTINE X 2)
Culture: NO GROWTH
Culture: NO GROWTH

## 2016-09-01 NOTE — Discharge Summary (Signed)
Physician Discharge Summary  Michelle Graves GYI:[id number] DOB: [birth year] DOA: 08/26/2016  PCP: No primary care provider on file.  Admit date: 08/26/2016 Discharge date: 09/01/2016  Time spent: 45 minutes  Recommendations for Outpatient Follow-up:  1. PCP in 1 week   Discharge Diagnoses:  Principal Problem:   Diverticulitis   AKi on CKD2   HYPERTENSION, MALIGNANT ESSENTIAL   KIDNEY DISEASE, CHRONIC, STAGE III   Sinus tachycardia   Diverticulitis of intestine without perforation or abscess without bleeding   Discharge Condition: stable  Diet recommendation: heart healthy  Filed Weights   08/26/16 1650  Weight: 86.6 kg (191 lb)    History of present illness:  Michelle Wellnitz Elliottis a 65 y.o.woman with a history of Rheumatoid arthritis (on plaquenil and methotrexate at baseline), HTN, and prior episode of diverticulitis in 2015 (hospitalized for one week, eventually had outpatient colonoscopy with GI doctor in [place]) who presents to the ED for evaluation of two days of progressive nausea, vomiting, and abdominal pain. Found to have Acute diverticulitis  Hospital Course:  Acute diverticulitis  -improved with supportive care, bowel rest, IVF, IV cipro and flagyl -diet slowly advanced -h/o colonoscopy in 2015 was normal except for diverticulosis per pt   Anemia  -due to chronic disease, dilution, meds- methotrexate and plaquenil -transfused 1 unit pBRC -no overt bleeding -anemia panel suggestive of chronic disease    HTN -continue amlodipine and metoprolol -resumed ARB  AKI on CKD 3, baseline creatinine 1.2-1.5 -resolved with hydration, ARB resumed  History of RA -Held plaquenil and methotrexate in the setting of acute infection -resumed at discharge  Hypokalemia -Replaced   Discharge Exam: Vitals:   08/30/16 1000 08/30/16 1413  BP: 122/84 116/82  Pulse: 80 85  Resp: 17 18  Temp: 98.5 F (36.9 C) 97.9 F (36.6 C)    General:  AAOx3 Cardiovascular: S1S2/RRR Respiratory: CTAB  Discharge Instructions   Discharge Instructions    Diet - low sodium heart healthy    Complete by:  As directed    Increase activity slowly    Complete by:  As directed      Discharge Medication List as of 08/30/2016  2:47 PM    START taking these medications   Details  ciprofloxacin (CIPRO) 500 MG tablet Take 1 tablet (500 mg total) by mouth 2 (two) times daily. For 4 days, Starting Sat 08/30/2016, Print    metroNIDAZOLE (FLAGYL) 500 MG tablet Take 1 tablet (500 mg total) by mouth every 8 (eight) hours. For 4days, Starting Sat 08/30/2016, Print      CONTINUE these medications which have NOT CHANGED   Details  amitriptyline (ELAVIL) 100 MG tablet Take 100 mg by mouth at bedtime., Starting Mon 06/16/2016, Historical Med    amLODipine (NORVASC) 10 MG tablet Take 10 mg by mouth daily., Historical Med    Calcium Carbonate-Vitamin D (CALCIUM-D PO) Take 1 tablet by mouth daily., Historical Med    folic acid (FOLVITE) 1 MG tablet Take 1 mg by mouth daily., Historical Med    hydrochlorothiazide (HYDRODIURIL) 25 MG tablet Take 25 mg by mouth daily., Starting Mon 06/16/2016, Historical Med    hydroxychloroquine (PLAQUENIL) 200 MG tablet Take 200 mg by mouth 2 (two) times daily. , Starting Mon 06/16/2016, Historical Med    lisinopril (PRINIVIL,ZESTRIL) 20 MG tablet Take 20 mg by mouth daily., Historical Med    methotrexate (RHEUMATREX) 2.5 MG tablet Take 15 mg by mouth every Monday. 6 tablets, Starting Mon 06/16/2016, Historical Med    metoprolol (  LOPRESSOR) 50 MG tablet Take 50 mg by mouth 2 (two) times daily., Starting Mon 06/16/2016, Historical Med    Multiple Vitamin (MULTIVITAMIN WITH MINERALS) TABS tablet Take 1 tablet by mouth daily., Historical Med    omeprazole (PRILOSEC) 20 MG capsule Take 20 mg by mouth 2 (two) times daily., Starting Mon 06/16/2016, Historical Med    traZODone (DESYREL) 100 MG tablet Take 100 mg by mouth at  bedtime., Historical Med    zolpidem (AMBIEN) 10 MG tablet Take 10 mg by mouth at bedtime as needed for sleep., Historical Med       No Known Allergies Follow-up Information    PCP. Schedule an appointment as soon as possible for a visit in 1 week(s).            The results of significant diagnostics from this hospitalization (including imaging, microbiology, ancillary and laboratory) are listed below for reference.    Significant Diagnostic Studies: Ct Abdomen Pelvis Wo Contrast  Result Date: 08/10/2016 CLINICAL DATA:  65 y/o F; 3 days of left lower quadrant pain with nausea and vomiting. EXAM: CT ABDOMEN AND PELVIS WITHOUT CONTRAST TECHNIQUE: Multidetector CT imaging of the abdomen and pelvis was performed following the standard protocol without IV contrast. COMPARISON:  05/21/2005 CT abdomen pelvis. FINDINGS: Lower chest: Right lung mild fibrotic changes and bronchiectasis. Cluster of ground-glass opacities in the right middle lobe. Mild cardiomegaly. Mild coronary artery calcifications. Hepatobiliary: No focal liver abnormality is seen. No gallstones, gallbladder wall thickening, or biliary dilatation. Focal fatty infiltration along the falciform ligament. Pancreas: Unremarkable. No pancreatic ductal dilatation or surrounding inflammatory changes. Spleen: Normal in size without focal abnormality. Adrenals/Urinary Tract: Adrenal glands are unremarkable. Kidneys are normal, without renal calculi, focal lesion, or hydronephrosis. Bladder is unremarkable. Stomach/Bowel: Moderate size hiatal hernia. Short segment of descending colon in the left lower quadrant with wall thickening and mild surrounding inflammatory changes (series 204, image 121). No evidence for perforation or abscess. No evidence for bowel obstruction. Normal appendix appear Vascular/Lymphatic: Aortic atherosclerosis. No enlarged abdominal or pelvic lymph nodes. Reproductive: Uterus and bilateral adnexa are unremarkable. Other:  No abdominal wall hernia or abnormality. No abdominopelvic ascites. Musculoskeletal: No acute fracture identified. Degenerative grade 1 L4-5 anterolisthesis. Prominent lower lumbar facet arthrosis. Mild osteoarthrosis of bilateral hip joints with acetabular fibrocystic degeneration. IMPRESSION: 1. Short segment of mild colitis of the descending colon in the left lower quadrant. No evidence for perforation or abscess. 2. Right lung base fibrotic changes and bronchiectasis. Ground-glass opacities in middle lobe probably represent an infectious or inflammatory process. 3. Moderate hiatal hernia. Electronically Signed   By: Mitzi Hansen M.D.   On: 08/10/2016 01:57   Dg Chest 2 View  Result Date: 08/26/2016 CLINICAL DATA:  Right-sided abdominal pain emesis and diarrhea EXAM: CHEST  2 VIEW COMPARISON:  02/21/1999 and FINDINGS: No acute infiltrate, consolidation or effusion. Small focus of atelectasis near the right CP angle. Stable cardiomediastinal silhouette with tortuous and unfolded aorta. Small calcified right upper lobe lung nodules. No pneumothorax. IMPRESSION: Small focus of right CP angle atelectasis. No acute infiltrate or edema. Electronically Signed   By: Jasmine Pang M.D.   On: 08/26/2016 18:34   Ct Abdomen Pelvis W Contrast  Result Date: 08/26/2016 CLINICAL DATA:  Right lower quadrant pain for 2 days, history of diverticulitis EXAM: CT ABDOMEN AND PELVIS WITH CONTRAST TECHNIQUE: Multidetector CT imaging of the abdomen and pelvis was performed using the standard protocol following bolus administration of intravenous contrast. CONTRAST:  66mL ISOVUE-300 IOPAMIDOL (  ISOVUE-300) INJECTION 61% COMPARISON:  08/10/2016 FINDINGS: Lower chest: Chronic changes in the lung bases are noted. The previously seen infiltrative change on the right has cleared. Hepatobiliary: No focal liver abnormality is seen. No gallstones, gallbladder wall thickening, or biliary dilatation. Pancreas: Unremarkable. No  pancreatic ductal dilatation or surrounding inflammatory changes. Spleen: Normal in size without focal abnormality. Adrenals/Urinary Tract: Adrenal glands are unremarkable. Kidneys are normal, without renal calculi, focal lesion, or hydronephrosis. Bladder is unremarkable. Stomach/Bowel: Stomach is within normal limits. Appendix appears normal. There remains wall thickening and mild pericolonic inflammatory change in the descending and proximal sigmoid colons consistent with diverticulitis. The overall appearance is stable. No perforation or abscess is seen. Vascular/Lymphatic: No significant vascular findings are present. No enlarged abdominal or pelvic lymph nodes. Reproductive: Uterus and bilateral adnexa are unremarkable. Other: No abdominal wall hernia or abnormality. No abdominopelvic ascites. Musculoskeletal: Degenerative changes of lumbar spine are seen. IMPRESSION: Changes consistent with diverticulitis in the distal descending and proximal sigmoid colon. No abscess or perforation is noted at this time. Normal-appearing appendix. Electronically Signed   By: Alcide Clever M.D.   On: 08/26/2016 21:13    Microbiology: Recent Results (from the past 240 hour(s))  Blood culture (routine x 2)     Status: None   Collection Time: 08/26/16  8:55 PM  Result Value Ref Range Status   Specimen Description BLOOD LEFT HAND  Final   Special Requests BOTTLES DRAWN AEROBIC AND ANAEROBIC 5CC  Final   Culture NO GROWTH 5 DAYS  Final   Report Status 08/31/2016 FINAL  Final  Blood culture (routine x 2)     Status: None   Collection Time: 08/26/16  9:54 PM  Result Value Ref Range Status   Specimen Description BLOOD RIGHT THUMB  Final   Special Requests IN PEDIATRIC BOTTLE 1CC  Final   Culture NO GROWTH 5 DAYS  Final   Report Status 08/31/2016 FINAL  Final     Labs: Basic Metabolic Panel:  Recent Labs Lab 08/26/16 1725 08/27/16 0327 08/28/16 0425 08/29/16 0224 08/30/16 0504  NA 137 137 136 138 133*  K  3.2* 3.2* 3.7 3.7 3.6  CL 99* 107 109 109 108  CO2 22 21* 23 22 22   GLUCOSE 128* 102* 89 88 93  BUN 19 15 8  <5* <5*  CREATININE 1.86* 1.29* 1.00 1.01* 0.99  CALCIUM 9.6 7.7* 7.8* 8.3* 8.4*   Liver Function Tests:  Recent Labs Lab 08/26/16 1725 08/27/16 0327  AST 36 24  ALT 24 20  ALKPHOS 90 61  BILITOT 0.9 0.8  PROT 8.3* 6.2*  ALBUMIN 4.4 3.2*    Recent Labs Lab 08/26/16 1725  LIPASE 24   No results for input(s): AMMONIA in the last 168 hours. CBC:  Recent Labs Lab 08/26/16 1725 08/27/16 0327 08/28/16 0425 08/29/16 0224 08/29/16 1954 08/30/16 0504  WBC 10.7* 10.6* 6.3 4.2  --  3.9*  HGB 10.8* 8.7* 7.7* 7.8* 9.4* 9.2*  HCT 31.9* 26.3* 23.2* 23.6* 27.8* 26.9*  MCV 95.2 96.0 95.1 93.7  --  91.2  PLT 198 147* 162 198  --  207   Cardiac Enzymes: No results for input(s): CKTOTAL, CKMB, CKMBINDEX, TROPONINI in the last 168 hours. BNP: BNP (last 3 results) No results for input(s): BNP in the last 8760 hours.  ProBNP (last 3 results) No results for input(s): PROBNP in the last 8760 hours.  CBG: No results for input(s): GLUCAP in the last 168 hours.     Signed:  13/03/17  MD.  [facility] 09/01/2016, 5:04 PM

## 2016-10-13 ENCOUNTER — Emergency Department (HOSPITAL_COMMUNITY)
Admission: EM | Admit: 2016-10-13 | Discharge: 2016-10-13 | Disposition: A | Discharge status: Home / Self Care | Payer: Medicare Other | Attending: Emergency Medicine | Admitting: Emergency Medicine

## 2016-10-13 ENCOUNTER — Encounter (HOSPITAL_COMMUNITY): Payer: Self-pay | Admitting: Neurology

## 2016-10-13 DIAGNOSIS — R112 Nausea with vomiting, unspecified: Secondary | ICD-10-CM | POA: Diagnosis not present

## 2016-10-13 DIAGNOSIS — I10 Essential (primary) hypertension: Secondary | ICD-10-CM | POA: Diagnosis not present

## 2016-10-13 DIAGNOSIS — Z79899 Other long term (current) drug therapy: Secondary | ICD-10-CM | POA: Insufficient documentation

## 2016-10-13 DIAGNOSIS — R197 Diarrhea, unspecified: Secondary | ICD-10-CM | POA: Diagnosis not present

## 2016-10-13 LAB — COMPREHENSIVE METABOLIC PANEL
ALT: 19 U/L (ref 14–54)
AST: 27 U/L (ref 15–41)
Albumin: 4.4 g/dL (ref 3.5–5.0)
Alkaline Phosphatase: 112 U/L (ref 38–126)
Anion gap: 12 (ref 5–15)
BUN: 22 mg/dL — ABNORMAL HIGH (ref 6–20)
CO2: 23 mmol/L (ref 22–32)
Calcium: 9.3 mg/dL (ref 8.9–10.3)
Chloride: 102 mmol/L (ref 101–111)
Creatinine, Ser: 1.51 mg/dL — ABNORMAL HIGH (ref 0.44–1.00)
GFR calc Af Amer: 41 mL/min — ABNORMAL LOW (ref >60)
GFR calc non Af Amer: 36 mL/min — ABNORMAL LOW (ref >60)
Glucose, Bld: 107 mg/dL — ABNORMAL HIGH (ref 65–99)
Potassium: 3.8 mmol/L (ref 3.5–5.1)
Sodium: 137 mmol/L (ref 135–145)
Total Bilirubin: 0.6 mg/dL (ref 0.3–1.2)
Total Protein: 8 g/dL (ref 6.5–8.1)

## 2016-10-13 LAB — CBC
HCT: 34.1 % — ABNORMAL LOW (ref 36.0–46.0)
Hemoglobin: 11.2 g/dL — ABNORMAL LOW (ref 12.0–15.0)
MCH: 31.2 pg (ref 26.0–34.0)
MCHC: 32.8 g/dL (ref 30.0–36.0)
MCV: 95 fL (ref 78.0–100.0)
Platelets: 214 10*3/uL (ref 150–400)
RBC: 3.59 MIL/uL — ABNORMAL LOW (ref 3.87–5.11)
RDW: 14.9 % (ref 11.5–15.5)
WBC: 4.6 10*3/uL (ref 4.0–10.5)

## 2016-10-13 LAB — LIPASE, BLOOD: Lipase: 21 U/L (ref 11–51)

## 2016-10-13 MED ORDER — SODIUM CHLORIDE 0.9 % IV BOLUS (SEPSIS)
1000.0000 mL | Freq: Once | INTRAVENOUS | Status: AC
  Administered 2016-10-13: 1000 mL via INTRAVENOUS

## 2016-10-13 MED ORDER — ONDANSETRON HCL 4 MG/2ML IJ SOLN: 4.0000 mg | INTRAMUSCULAR | Status: DC | PRN

## 2016-10-13 MED ORDER — ONDANSETRON 4 MG PO TBDP: 4.0000 mg | ORAL_TABLET | Freq: Once | ORAL | Status: DC

## 2016-10-13 MED ORDER — BISMUTH SUBSALICYLATE 262 MG/15ML PO SUSP
30.0000 mL | Freq: Once | ORAL | Status: AC
  Administered 2016-10-13: 30 mL via ORAL
  Filled 2016-10-13: qty 118

## 2016-10-13 NOTE — ED Triage Notes (Signed)
Pt reports n/v/d x 2 months. Reports can't eat and sleep. Reports history of diverticulitis, moved here recently.

## 2016-10-13 NOTE — ED Provider Notes (Signed)
[facility]-EMERGENCY DEPT Provider Note   CSN: [id number] Arrival date & time: 10/13/16  1047  By signing my name below, I, Arianna Nassar, attest that this documentation has been prepared under the direction and in the presence of Lyndal Pulley, MD.  Electronically Signed: Octavia Heir, ED Scribe. 10/13/16. 2:59 PM.    History   Chief Complaint Chief Complaint  Patient presents with  . Nausea  . Emesis   The history is provided by the patient. No language interpreter was used.  Emesis   This is a new problem. The current episode started more than 1 week ago. The problem occurs 2 to 4 times per day. The problem has been gradually worsening. There has been no fever. Associated symptoms include abdominal pain, diarrhea and a fever (subjective).   HPI Comments: Michelle Graves is a 65 y.o. female who has a PMhx of diverticulitis and HTN presents to the Emergency Department complaining of moderate, gradual worsening nausea and vomiting (x4) that began 6 days ago. Pt reports associated abdominal cramping (L>R), diarrhea, and subjective fever. Pt has a hx of diverticulitis and reports that her current symptoms are mildly similar to her prior flare-ups of diverticulitis. She has not taken any medication to relieve her symptoms due to vomiting them back up. Pt is able to tolerate fluids moderately. She denies any recent travel, sick acquaintances, or recent antibiotic intake.    Past Medical History:  Diagnosis Date  . Chest pain   . Diverticulitis   . Hypertension     Patient Active Problem List   Diagnosis Date Noted  . Diverticulitis of intestine without perforation or abscess without bleeding   . Sinus tachycardia 08/27/2016  . Diverticulitis 08/26/2016  . HYPERTENSION, MALIGNANT ESSENTIAL 06/24/2007  . KIDNEY DISEASE, CHRONIC, STAGE III 06/24/2007  . ARTHRITIS, RHEUMATOID, SEROPOSITIVE 06/24/2007    History reviewed. No pertinent surgical history.  OB History    No data  available       Home Medications    Prior to Admission medications   Medication Sig Start Date End Date Taking? Authorizing Provider  amitriptyline (ELAVIL) 100 MG tablet Take 100 mg by mouth at bedtime. 06/16/16   Historical Provider, MD  amLODipine (NORVASC) 10 MG tablet Take 10 mg by mouth daily.    Historical Provider, MD  Calcium Carbonate-Vitamin D (CALCIUM-D PO) Take 1 tablet by mouth daily.    Historical Provider, MD  ciprofloxacin (CIPRO) 500 MG tablet Take 1 tablet (500 mg total) by mouth 2 (two) times daily. For 4 days 08/30/16   Zannie Cove, MD  folic acid (FOLVITE) 1 MG tablet Take 1 mg by mouth daily.    Historical Provider, MD  hydrochlorothiazide (HYDRODIURIL) 25 MG tablet Take 25 mg by mouth daily. 06/16/16   Historical Provider, MD  hydroxychloroquine (PLAQUENIL) 200 MG tablet Take 200 mg by mouth 2 (two) times daily.  06/16/16   Historical Provider, MD  lisinopril (PRINIVIL,ZESTRIL) 20 MG tablet Take 20 mg by mouth daily.    Historical Provider, MD  methotrexate (RHEUMATREX) 2.5 MG tablet Take 15 mg by mouth every Monday. 6 tablets 06/16/16   Historical Provider, MD  metoprolol (LOPRESSOR) 50 MG tablet Take 50 mg by mouth 2 (two) times daily. 06/16/16   Historical Provider, MD  metroNIDAZOLE (FLAGYL) 500 MG tablet Take 1 tablet (500 mg total) by mouth every 8 (eight) hours. For 4days 08/30/16   Zannie Cove, MD  Multiple Vitamin (MULTIVITAMIN WITH MINERALS) TABS tablet Take 1 tablet by mouth daily.  Historical Provider, MD  omeprazole (PRILOSEC) 20 MG capsule Take 20 mg by mouth 2 (two) times daily. 06/16/16   Historical Provider, MD  traZODone (DESYREL) 100 MG tablet Take 100 mg by mouth at bedtime.    Historical Provider, MD  zolpidem (AMBIEN) 10 MG tablet Take 10 mg by mouth at bedtime as needed for sleep.    Historical Provider, MD    Family History No family history on file.  Social History Social History  Substance Use Topics  . Smoking status: Never Smoker  .  Smokeless tobacco: Never Used  . Alcohol use No     Allergies   Patient has no known allergies.   Review of Systems Review of Systems  Constitutional: Positive for fever (subjective).  Gastrointestinal: Positive for abdominal pain, diarrhea, nausea and vomiting.  All other systems reviewed and are negative.    Physical Exam Updated Vital Signs BP 117/76 (BP Location: Right Arm)   Pulse 94   Temp 98.9 F (37.2 C) (Oral)   Resp 18   Ht 5\' 7"  (1.702 m)   Wt (S) 187 lb 1 oz (84.9 kg)   SpO2 99%   BMI 29.30 kg/m   Physical Exam  Constitutional: She is oriented to person, place, and time. She appears well-developed and well-nourished.  HENT:  Head: Normocephalic.  Eyes: EOM are normal.  Neck: Normal range of motion.  Pulmonary/Chest: Effort normal.  Abdominal: Soft. She exhibits no distension. There is no tenderness. There is no rebound and no guarding.  Musculoskeletal: Normal range of motion.  Neurological: She is alert and oriented to person, place, and time.  Psychiatric: She has a normal mood and affect.  Nursing note and vitals reviewed.    ED Treatments / Results  DIAGNOSTIC STUDIES: Oxygen Saturation is 99% on RA, normal by my interpretation.  COORDINATION OF CARE: 2:56 PM Discussed treatment plan with pt at bedside and pt agreed to plan.  Labs (all labs ordered are listed, but only abnormal results are displayed) Labs Reviewed  COMPREHENSIVE METABOLIC PANEL - Abnormal; Notable for the following:       Result Value   Glucose, Bld 107 (*)    BUN 22 (*)    Creatinine, Ser 1.51 (*)    GFR calc non Af Amer 36 (*)    GFR calc Af Amer 41 (*)    All other components within normal limits  CBC - Abnormal; Notable for the following:    RBC 3.59 (*)    Hemoglobin 11.2 (*)    HCT 34.1 (*)    All other components within normal limits  LIPASE, BLOOD  URINALYSIS, ROUTINE W REFLEX MICROSCOPIC    EKG  EKG Interpretation None       Radiology No results  found.  Procedures Procedures (including critical care time)  Medications Ordered in ED Medications  bismuth subsalicylate (PEPTO BISMOL) 262 MG/15ML suspension 30 mL (30 mLs Oral Given 10/13/16 1533)  sodium chloride 0.9 % bolus 1,000 mL (0 mLs Intravenous Stopped 10/13/16 1647)     Initial Impression / Assessment and Plan / ED Course  I have reviewed the triage vital signs and the nursing notes.  Pertinent labs & imaging results that were available during my care of the patient were reviewed by me and considered in my medical decision making (see chart for details).  Clinical Course     65 y.o. female presents with intermittent vomiting episodes. No WBC elevation, no fever, no blood in stools, no abdominal tenderness to  suggest diverticulitis. She initially endorses diarrhea then states she has had normal bowel movements which are firm. Provided bismuth salts to help with the symptoms, given IVF for losses and other supportive care measures. No evidence of surgical emergency or indication for imaging. Plan to follow up with PCP this week for recheck of labs and return precautions discussed for worsening or new concerning symptoms.   Final Clinical Impressions(s) / ED Diagnoses   Final diagnoses:  Nausea vomiting and diarrhea   I personally performed the services described in this documentation, which was scribed in my presence. The recorded information has been reviewed and is accurate.     New Prescriptions Discharge Medication List as of 10/13/2016  4:37 PM       Lyndal Pulley, MD 10/14/16 1245

## 2016-10-13 NOTE — ED Notes (Signed)
Patient aware of delay due to room availability, no change in conditon

## 2016-10-13 NOTE — ED Notes (Signed)
Pt had a bout of diverticulitis in 2015. Pt states as soon as she eats anything, she is vomiting and having diarrhea. Also c/o burning in epigastric area

## 2016-12-12 ENCOUNTER — Other Ambulatory Visit: Payer: Self-pay

## 2016-12-12 ENCOUNTER — Other Ambulatory Visit (HOSPITAL_COMMUNITY)
Admission: RE | Admit: 2016-12-12 | Discharge: 2016-12-12 | Disposition: A | Discharge status: Home / Self Care | Payer: Medicare Other | Source: Ambulatory Visit | Attending: Pediatrics | Admitting: Pediatrics

## 2016-12-12 DIAGNOSIS — Z124 Encounter for screening for malignant neoplasm of cervix: Secondary | ICD-10-CM | POA: Diagnosis present

## 2016-12-17 LAB — CYTOLOGY - PAP: Diagnosis: NEGATIVE

## 2017-01-15 ENCOUNTER — Emergency Department (HOSPITAL_COMMUNITY)
Admission: EM | Admit: 2017-01-15 | Discharge: 2017-01-15 | Disposition: A | Discharge status: Home / Self Care | Payer: Medicare Other | Attending: Emergency Medicine | Admitting: Emergency Medicine

## 2017-01-15 ENCOUNTER — Emergency Department (HOSPITAL_COMMUNITY): Payer: Medicare Other

## 2017-01-15 ENCOUNTER — Encounter (HOSPITAL_COMMUNITY): Payer: Self-pay | Admitting: Emergency Medicine

## 2017-01-15 DIAGNOSIS — R42 Dizziness and giddiness: Secondary | ICD-10-CM | POA: Diagnosis not present

## 2017-01-15 DIAGNOSIS — I129 Hypertensive chronic kidney disease with stage 1 through stage 4 chronic kidney disease, or unspecified chronic kidney disease: Secondary | ICD-10-CM | POA: Diagnosis not present

## 2017-01-15 DIAGNOSIS — R531 Weakness: Secondary | ICD-10-CM | POA: Diagnosis present

## 2017-01-15 DIAGNOSIS — E860 Dehydration: Secondary | ICD-10-CM | POA: Diagnosis not present

## 2017-01-15 DIAGNOSIS — N183 Chronic kidney disease, stage 3 (moderate): Secondary | ICD-10-CM | POA: Diagnosis not present

## 2017-01-15 DIAGNOSIS — Z79899 Other long term (current) drug therapy: Secondary | ICD-10-CM | POA: Insufficient documentation

## 2017-01-15 DIAGNOSIS — R55 Syncope and collapse: Secondary | ICD-10-CM | POA: Insufficient documentation

## 2017-01-15 HISTORY — DX: Unspecified osteoarthritis, unspecified site: M19.90

## 2017-01-15 LAB — CBC WITH DIFFERENTIAL/PLATELET
Basophils Absolute: 0 10*3/uL (ref 0.0–0.1)
Basophils Relative: 0 %
Eosinophils Absolute: 0.2 10*3/uL (ref 0.0–0.7)
Eosinophils Relative: 2 %
HCT: 35.3 % — ABNORMAL LOW (ref 36.0–46.0)
Hemoglobin: 11.5 g/dL — ABNORMAL LOW (ref 12.0–15.0)
Lymphocytes Relative: 14 %
Lymphs Abs: 1.2 10*3/uL (ref 0.7–4.0)
MCH: 30.6 pg (ref 26.0–34.0)
MCHC: 32.6 g/dL (ref 30.0–36.0)
MCV: 93.9 fL (ref 78.0–100.0)
Monocytes Absolute: 0.1 10*3/uL (ref 0.1–1.0)
Monocytes Relative: 1 %
Neutro Abs: 6.8 10*3/uL (ref 1.7–7.7)
Neutrophils Relative %: 83 %
Platelets: 172 10*3/uL (ref 150–400)
RBC: 3.76 MIL/uL — ABNORMAL LOW (ref 3.87–5.11)
RDW: 14.6 % (ref 11.5–15.5)
WBC: 8.3 10*3/uL (ref 4.0–10.5)

## 2017-01-15 LAB — I-STAT TROPONIN, ED
Troponin i, poc: 0.02 ng/mL (ref 0.00–0.08)
Troponin i, poc: 0.01 ng/mL (ref 0.00–0.08)

## 2017-01-15 LAB — BASIC METABOLIC PANEL
Anion gap: 9 (ref 5–15)
BUN: 25 mg/dL — ABNORMAL HIGH (ref 6–20)
CO2: 25 mmol/L (ref 22–32)
Calcium: 8.4 mg/dL — ABNORMAL LOW (ref 8.9–10.3)
Chloride: 105 mmol/L (ref 101–111)
Creatinine, Ser: 1.6 mg/dL — ABNORMAL HIGH (ref 0.44–1.00)
GFR calc Af Amer: 38 mL/min — ABNORMAL LOW (ref >60)
GFR calc non Af Amer: 33 mL/min — ABNORMAL LOW (ref >60)
Glucose, Bld: 132 mg/dL — ABNORMAL HIGH (ref 65–99)
Potassium: 3.5 mmol/L (ref 3.5–5.1)
Sodium: 139 mmol/L (ref 135–145)

## 2017-01-15 MED ORDER — MECLIZINE HCL 25 MG PO TABS: 25.0000 mg | ORAL_TABLET | Freq: Three times a day (TID) | ORAL | 0 refills | Status: DC | PRN

## 2017-01-15 MED ORDER — SODIUM CHLORIDE 0.9 % IV BOLUS (SEPSIS)
1000.0000 mL | Freq: Once | INTRAVENOUS | Status: AC
  Administered 2017-01-15: 1000 mL via INTRAVENOUS

## 2017-01-15 MED ORDER — ONDANSETRON HCL 4 MG/2ML IJ SOLN
4.0000 mg | Freq: Once | INTRAMUSCULAR | Status: AC
  Administered 2017-01-15: 4 mg via INTRAVENOUS
  Filled 2017-01-15: qty 2

## 2017-01-15 NOTE — Discharge Instructions (Addendum)
Push fluids to stay hydrated.  Take meclizine as needed for any vertigo.

## 2017-01-15 NOTE — ED Triage Notes (Signed)
Per [facility] pt was outside of apartment complex c/o weakness and nausea since 11 am. Pt was alert and ambulatory on scene. Pt takes handful of BP medications and reports BP is lower than her normal. No LOC or pain at triage.

## 2017-01-15 NOTE — ED Provider Notes (Signed)
Patient discussed with Dr. Adela Lank. I was asked to follow-up on the second troponin. This is normal. Patient remains asymptomatic. She's been up and ambulatory with no symptoms. Discussed vertigo precautions with her. We'll ask her to use Antivert with any recurrence of symptoms. Primary care follow-up.   Rolland Porter, MD 01/15/17 [phone]

## 2017-01-15 NOTE — ED Notes (Signed)
Patient was alert, oriented and stable upon discharge. RN went over AVS and patient had no further questions.  

## 2017-01-15 NOTE — ED Provider Notes (Signed)
[facility]-EMERGENCY DEPT Provider Note   CSN: [id number] Arrival date & time: 01/15/17  1254     History   Chief Complaint Chief Complaint  Patient presents with  . Weakness    HPI Michelle Graves is a 66 y.o. female.  66 yo F with a chief complaint of dizziness. Patient was sitting on a bench outside of her house and felt suddenly very bad. She is unable to describe it much better than this. She Dr. head down between her legs with some mild improvement. Someone doubt 911 when they saw her and EMS arrived. She denied chest pain.  She describes some mild shortness of breath some diaphoresis and nausea. Since the event she is felt mildly nauseated. Denies any lower extremity edema. Denies history of heart failure. Denies any current sensation like the room is spinning around her.   The history is provided by the patient.  Weakness  Primary symptoms include dizziness. This is a new problem. The current episode started less than 1 hour ago. The problem has been gradually improving. There was no focality noted. There has been no fever. Pertinent negatives include no shortness of breath, no chest pain, no vomiting and no headaches. There were no medications administered prior to arrival. Associated medical issues do not include trauma, a bleeding disorder or seizures.    Past Medical History:  Diagnosis Date  . Arthritis   . Chest pain   . Diverticulitis   . Hypertension     Patient Active Problem List   Diagnosis Date Noted  . Diverticulitis of intestine without perforation or abscess without bleeding   . Sinus tachycardia 08/27/2016  . Diverticulitis 08/26/2016  . HYPERTENSION, MALIGNANT ESSENTIAL 06/24/2007  . KIDNEY DISEASE, CHRONIC, STAGE III 06/24/2007  . ARTHRITIS, RHEUMATOID, SEROPOSITIVE 06/24/2007    History reviewed. No pertinent surgical history.  OB History    No data available       Home Medications    Prior to Admission medications   Medication Sig Start  Date End Date Taking? Authorizing Provider  amitriptyline (ELAVIL) 100 MG tablet Take 100 mg by mouth at bedtime. 06/16/16  Yes Historical Provider, MD  amLODipine (NORVASC) 10 MG tablet Take 10 mg by mouth daily.   Yes Historical Provider, MD  Calcium Carbonate-Vitamin D (CALCIUM-D PO) Take 1 tablet by mouth daily.   Yes Historical Provider, MD  folic acid (FOLVITE) 1 MG tablet Take 1 mg by mouth daily.   Yes Historical Provider, MD  hydrochlorothiazide (HYDRODIURIL) 25 MG tablet Take 25 mg by mouth daily. 06/16/16  Yes Historical Provider, MD  hydroxychloroquine (PLAQUENIL) 200 MG tablet Take 200 mg by mouth 2 (two) times daily.  06/16/16  Yes Historical Provider, MD  lisinopril (PRINIVIL,ZESTRIL) 20 MG tablet Take 20 mg by mouth daily.   Yes Historical Provider, MD  metoprolol (LOPRESSOR) 50 MG tablet Take 50 mg by mouth 2 (two) times daily. 06/16/16  Yes Historical Provider, MD  Multiple Vitamin (MULTIVITAMIN WITH MINERALS) TABS tablet Take 1 tablet by mouth daily.   Yes Historical Provider, MD  omeprazole (PRILOSEC) 20 MG capsule Take 20 mg by mouth 2 (two) times daily. 06/16/16  Yes Historical Provider, MD  traZODone (DESYREL) 100 MG tablet Take 100 mg by mouth at bedtime.   Yes Historical Provider, MD  zolpidem (AMBIEN) 10 MG tablet Take 10 mg by mouth at bedtime as needed for sleep.   Yes Historical Provider, MD  ciprofloxacin (CIPRO) 500 MG tablet Take 1 tablet (500 mg total) by  mouth 2 (two) times daily. For 4 days Patient not taking: Reported on 01/15/2017 08/30/16   Zannie Cove, MD  metroNIDAZOLE (FLAGYL) 500 MG tablet Take 1 tablet (500 mg total) by mouth every 8 (eight) hours. For 4days Patient not taking: Reported on 01/15/2017 08/30/16   Zannie Cove, MD    Family History No family history on file.  Social History Social History  Substance Use Topics  . Smoking status: Never Smoker  . Smokeless tobacco: Never Used  . Alcohol use No     Allergies   Patient has no known  allergies.   Review of Systems Review of Systems  Constitutional: Negative for chills and fever.  HENT: Negative for congestion and rhinorrhea.   Eyes: Negative for redness and visual disturbance.  Respiratory: Negative for shortness of breath and wheezing.   Cardiovascular: Negative for chest pain and palpitations.  Gastrointestinal: Negative for nausea and vomiting.  Genitourinary: Negative for dysuria and urgency.  Musculoskeletal: Negative for arthralgias and myalgias.  Skin: Negative for pallor and wound.  Neurological: Positive for dizziness, syncope (near) and weakness. Negative for headaches.     Physical Exam Updated Vital Signs BP (!) 134/92   Pulse (!) 102   Temp 97.7 F (36.5 C) (Oral)   Resp 18   Ht 5' 6.5" (1.689 m)   Wt 200 lb (90.7 kg)   SpO2 98%   BMI 31.80 kg/m   Physical Exam  Constitutional: She is oriented to person, place, and time. She appears well-developed and well-nourished. No distress.  HENT:  Head: Normocephalic and atraumatic.  Eyes: EOM are normal. Pupils are equal, round, and reactive to light.  Neck: Normal range of motion. Neck supple.  Cardiovascular: Normal rate and regular rhythm.  Exam reveals no gallop and no friction rub.   No murmur heard. Pulmonary/Chest: Effort normal. She has no wheezes. She has no rales.  Abdominal: Soft. She exhibits no distension. There is no tenderness. There is no guarding.  Musculoskeletal: She exhibits no edema or tenderness.  Neurological: She is alert and oriented to person, place, and time. She has normal strength. No cranial nerve deficit or sensory deficit. She displays a negative Romberg sign. Coordination and gait normal. GCS eye subscore is 4. GCS verbal subscore is 5. GCS motor subscore is 6. She displays no Babinski's sign on the right side. She displays no Babinski's sign on the left side.  Reflex Scores:      Tricep reflexes are 2+ on the right side and 2+ on the left side.      Bicep reflexes  are 2+ on the right side and 2+ on the left side.      Brachioradialis reflexes are 2+ on the right side and 2+ on the left side.      Patellar reflexes are 2+ on the right side and 2+ on the left side.      Achilles reflexes are 2+ on the right side and 2+ on the left side. Skin: Skin is warm and dry. She is not diaphoretic.  Psychiatric: She has a normal mood and affect. Her behavior is normal.  Nursing note and vitals reviewed.    ED Treatments / Results  Labs (all labs ordered are listed, but only abnormal results are displayed) Labs Reviewed  CBC WITH DIFFERENTIAL/PLATELET - Abnormal; Notable for the following:       Result Value   RBC 3.76 (*)    Hemoglobin 11.5 (*)    HCT 35.3 (*)  All other components within normal limits  BASIC METABOLIC PANEL - Abnormal; Notable for the following:    Glucose, Bld 132 (*)    BUN 25 (*)    Creatinine, Ser 1.60 (*)    Calcium 8.4 (*)    GFR calc non Af Amer 33 (*)    GFR calc Af Amer 38 (*)    All other components within normal limits  I-STAT TROPOININ, ED  I-STAT TROPOININ, ED    EKG  EKG Interpretation  Date/Time:  Thursday January 15 2017 13:11:34 EDT Ventricular Rate:  93 PR Interval:    QRS Duration: 90 QT Interval:  371 QTC Calculation: 462 R Axis:   -22 Text Interpretation:  Sinus rhythm Probable left atrial enlargement Borderline left axis deviation Since last tracing rate slower Otherwise no significant change no wpw prolonged qt or brugada Confirmed by Aaric Dolph MD, DANIEL [phone]) on 01/15/2017 1:49:11 PM       Radiology Dg Chest 2 View  Result Date: 01/15/2017 CLINICAL DATA:  Weakness and recent syncopal episode EXAM: CHEST  2 VIEW COMPARISON:  08/26/2016 FINDINGS: The heart size and mediastinal contours are within normal limits. Both lungs are clear. The visualized skeletal structures are unremarkable. IMPRESSION: No active cardiopulmonary disease. Electronically Signed   By: Alcide Clever M.D.   On: 01/15/2017 16:03     Procedures Procedures (including critical care time)  Medications Ordered in ED Medications  ondansetron (ZOFRAN) injection 4 mg (4 mg Intravenous Given 01/15/17 1415)  sodium chloride 0.9 % bolus 1,000 mL (0 mLs Intravenous Stopped 01/15/17 1551)     Initial Impression / Assessment and Plan / ED Course  I have reviewed the triage vital signs and the nursing notes.  Pertinent labs & imaging results that were available during my care of the patient were reviewed by me and considered in my medical decision making (see chart for details).     65 yoF with a chief complaint of dizziness. Patient has difficulty describing this and thinks it feels like the room spinning around her and also like she is passing out. She has no focal neurologic issues and is able to ambulate without difficulty and therefore I feel most likely presyncopal feeling. Patient had no chest pain but is having ongoing nausea obtain a delta troponin.  Turned over to Dr. Fayrene Fearing.  If negative ok for d/c home with follow up with PCP for repeat renal function check.   The patients results and plan were reviewed and discussed.   Any x-rays performed were independently reviewed by myself.   Differential diagnosis were considered with the presenting HPI.  Medications  ondansetron (ZOFRAN) injection 4 mg (4 mg Intravenous Given 01/15/17 1415)  sodium chloride 0.9 % bolus 1,000 mL (0 mLs Intravenous Stopped 01/15/17 1551)    Vitals:   01/15/17 1259 01/15/17 1311 01/15/17 1312 01/15/17 1640  BP:  112/90  (!) 134/92  Pulse:  90  (!) 102  Resp:  14  18  Temp:  97.7 F (36.5 C)    TempSrc:  Oral    SpO2: 99% 98%  98%  Weight:   200 lb (90.7 kg)   Height:   5' 6.5" (1.689 m)     Final diagnoses:  Near syncope  Dehydration       Final Clinical Impressions(s) / ED Diagnoses   Final diagnoses:  Near syncope  Dehydration    New Prescriptions New Prescriptions   No medications on file     Melene Plan,  DO  01/15/17 1723  

## 2017-01-15 NOTE — ED Notes (Signed)
Bed: WA01 Expected date:  Expected time:  Means of arrival:  Comments: EMS syncope 

## 2017-03-19 ENCOUNTER — Encounter (HOSPITAL_COMMUNITY): Payer: Self-pay | Admitting: *Deleted

## 2017-03-19 ENCOUNTER — Emergency Department (HOSPITAL_COMMUNITY)
Admission: EM | Admit: 2017-03-19 | Discharge: 2017-03-20 | Disposition: A | Discharge status: Home / Self Care | Payer: Medicare Other | Attending: Emergency Medicine | Admitting: Emergency Medicine

## 2017-03-19 ENCOUNTER — Emergency Department (HOSPITAL_COMMUNITY): Payer: Medicare Other

## 2017-03-19 DIAGNOSIS — I129 Hypertensive chronic kidney disease with stage 1 through stage 4 chronic kidney disease, or unspecified chronic kidney disease: Secondary | ICD-10-CM | POA: Diagnosis not present

## 2017-03-19 DIAGNOSIS — R072 Precordial pain: Secondary | ICD-10-CM | POA: Diagnosis not present

## 2017-03-19 DIAGNOSIS — R0602 Shortness of breath: Secondary | ICD-10-CM | POA: Diagnosis not present

## 2017-03-19 DIAGNOSIS — Z79899 Other long term (current) drug therapy: Secondary | ICD-10-CM | POA: Diagnosis not present

## 2017-03-19 DIAGNOSIS — R1084 Generalized abdominal pain: Secondary | ICD-10-CM | POA: Diagnosis not present

## 2017-03-19 DIAGNOSIS — N183 Chronic kidney disease, stage 3 (moderate): Secondary | ICD-10-CM | POA: Insufficient documentation

## 2017-03-19 DIAGNOSIS — R109 Unspecified abdominal pain: Secondary | ICD-10-CM | POA: Diagnosis present

## 2017-03-19 LAB — URINALYSIS, ROUTINE W REFLEX MICROSCOPIC
Bilirubin Urine: NEGATIVE
Glucose, UA: NEGATIVE mg/dL
Hgb urine dipstick: NEGATIVE
Ketones, ur: NEGATIVE mg/dL
Nitrite: NEGATIVE
Protein, ur: NEGATIVE mg/dL
Specific Gravity, Urine: 1.014 (ref 1.005–1.030)
pH: 5 (ref 5.0–8.0)

## 2017-03-19 LAB — CBC
HCT: 36.1 % (ref 36.0–46.0)
Hemoglobin: 11.8 g/dL — ABNORMAL LOW (ref 12.0–15.0)
MCH: 30.5 pg (ref 26.0–34.0)
MCHC: 32.7 g/dL (ref 30.0–36.0)
MCV: 93.3 fL (ref 78.0–100.0)
Platelets: 225 10*3/uL (ref 150–400)
RBC: 3.87 MIL/uL (ref 3.87–5.11)
RDW: 13.9 % (ref 11.5–15.5)
WBC: 5.6 10*3/uL (ref 4.0–10.5)

## 2017-03-19 LAB — COMPREHENSIVE METABOLIC PANEL
ALT: 16 U/L (ref 14–54)
AST: 30 U/L (ref 15–41)
Albumin: 3.7 g/dL (ref 3.5–5.0)
Alkaline Phosphatase: 123 U/L (ref 38–126)
Anion gap: 9 (ref 5–15)
BUN: 14 mg/dL (ref 6–20)
CO2: 24 mmol/L (ref 22–32)
Calcium: 8.9 mg/dL (ref 8.9–10.3)
Chloride: 107 mmol/L (ref 101–111)
Creatinine, Ser: 1.37 mg/dL — ABNORMAL HIGH (ref 0.44–1.00)
GFR calc Af Amer: 46 mL/min — ABNORMAL LOW (ref >60)
GFR calc non Af Amer: 40 mL/min — ABNORMAL LOW (ref >60)
Glucose, Bld: 129 mg/dL — ABNORMAL HIGH (ref 65–99)
Potassium: 3.7 mmol/L (ref 3.5–5.1)
Sodium: 140 mmol/L (ref 135–145)
Total Bilirubin: 0.9 mg/dL (ref 0.3–1.2)
Total Protein: 7.3 g/dL (ref 6.5–8.1)

## 2017-03-19 LAB — D-DIMER, QUANTITATIVE: D-Dimer, Quant: 2.13 ug/mL-FEU — ABNORMAL HIGH (ref 0.00–0.50)

## 2017-03-19 LAB — LIPASE, BLOOD: Lipase: 20 U/L (ref 11–51)

## 2017-03-19 LAB — TROPONIN I: Troponin I: <0.03 ng/mL (ref <0.03)

## 2017-03-19 MED ORDER — SODIUM CHLORIDE 0.9 % IV SOLN
INTRAVENOUS | Status: DC
  Administered 2017-03-19: 22:00:00 via INTRAVENOUS

## 2017-03-19 MED ORDER — ONDANSETRON HCL 4 MG/2ML IJ SOLN
4.0000 mg | Freq: Once | INTRAMUSCULAR | Status: AC
  Administered 2017-03-19: 4 mg via INTRAVENOUS
  Filled 2017-03-19: qty 2

## 2017-03-19 MED ORDER — IOPAMIDOL (ISOVUE-370) INJECTION 76%
INTRAVENOUS | Status: AC
  Administered 2017-03-19: 100 mL
  Filled 2017-03-19: qty 100

## 2017-03-19 MED ORDER — SODIUM CHLORIDE 0.9 % IV BOLUS (SEPSIS)
500.0000 mL | Freq: Once | INTRAVENOUS | Status: AC
  Administered 2017-03-19: 500 mL via INTRAVENOUS

## 2017-03-19 NOTE — ED Notes (Signed)
Ultrasound IV placed in right AC.  IV tip visualized free from walls of vein.  Blood return and flushed easily.  Patient tolerated procedure well.

## 2017-03-19 NOTE — ED Notes (Signed)
Patient transported to X-ray 

## 2017-03-19 NOTE — ED Notes (Signed)
IV team at the bedside. 

## 2017-03-19 NOTE — ED Notes (Signed)
No answer in lobby.

## 2017-03-19 NOTE — ED Notes (Signed)
Iv team arrived while patient in xray. Will enter another iv consult when patient returns

## 2017-03-19 NOTE — ED Notes (Signed)
Attempted to site iv unsuccessfully, iv team consult ordered

## 2017-03-19 NOTE — ED Notes (Signed)
Patient aware that a urine sample is needed 

## 2017-03-19 NOTE — ED Notes (Signed)
Patient returned from X-ray 

## 2017-03-19 NOTE — ED Triage Notes (Signed)
To ED for eval of continued diarrhea and abd pain for the past week. Also complaining of generalized body cramping. No sob or cp. Ambulatory without difficulty. Denies fevers. Denies blood in stool

## 2017-03-20 NOTE — ED Provider Notes (Signed)
[facility] DEPT Provider Note   CSN: [id number] Arrival date & time: 03/19/17  1448     History   Chief Complaint Chief Complaint  Patient presents with  . Abdominal Pain    HPI Michelle Graves is a 66 y.o. female.  Patient with one-month history of upper chest pain associated with some shortness of breath radiating to the back and both arms. Initial complaint on triage was more for abdominal pain for the past week associated with diarrhea. Patient went on to say she also had some nausea and vomiting. Belly pain now was crampy in nature but that's all resolved.      Past Medical History:  Diagnosis Date  . Arthritis   . Chest pain   . Diverticulitis   . Hypertension     Patient Active Problem List   Diagnosis Date Noted  . Diverticulitis of intestine without perforation or abscess without bleeding   . Sinus tachycardia 08/27/2016  . Diverticulitis 08/26/2016  . HYPERTENSION, MALIGNANT ESSENTIAL 06/24/2007  . KIDNEY DISEASE, CHRONIC, STAGE III 06/24/2007  . ARTHRITIS, RHEUMATOID, SEROPOSITIVE 06/24/2007    History reviewed. No pertinent surgical history.  OB History    No data available       Home Medications    Prior to Admission medications   Medication Sig Start Date End Date Taking? Authorizing Provider  amitriptyline (ELAVIL) 100 MG tablet Take 100 mg by mouth at bedtime. 06/16/16   [provider]  amLODipine (NORVASC) 10 MG tablet Take 10 mg by mouth daily.    [provider]  Calcium Carbonate-Vitamin D (CALCIUM-D PO) Take 1 tablet by mouth daily.    [provider]  ciprofloxacin (CIPRO) 500 MG tablet Take 1 tablet (500 mg total) by mouth 2 (two) times daily. For 4 days Patient not taking: Reported on 01/15/2017 08/30/16   Zannie Cove, MD  folic acid (FOLVITE) 1 MG tablet Take 1 mg by mouth daily.    [provider]  hydrochlorothiazide (HYDRODIURIL) 25 MG tablet Take 25 mg by mouth daily. 06/16/16    [provider]  hydroxychloroquine (PLAQUENIL) 200 MG tablet Take 200 mg by mouth 2 (two) times daily.  06/16/16   [provider]  lisinopril (PRINIVIL,ZESTRIL) 20 MG tablet Take 20 mg by mouth daily.    [provider]  meclizine (ANTIVERT) 25 MG tablet Take 1 tablet (25 mg total) by mouth 3 (three) times daily as needed for dizziness. 01/15/17   Rolland Porter, MD  metoprolol (LOPRESSOR) 50 MG tablet Take 50 mg by mouth 2 (two) times daily. 06/16/16   [provider]  metroNIDAZOLE (FLAGYL) 500 MG tablet Take 1 tablet (500 mg total) by mouth every 8 (eight) hours. For 4days Patient not taking: Reported on 01/15/2017 08/30/16   Zannie Cove, MD  Multiple Vitamin (MULTIVITAMIN WITH MINERALS) TABS tablet Take 1 tablet by mouth daily.    [provider]  omeprazole (PRILOSEC) 20 MG capsule Take 20 mg by mouth 2 (two) times daily. 06/16/16   [provider]  traZODone (DESYREL) 100 MG tablet Take 100 mg by mouth at bedtime.    [provider]  zolpidem (AMBIEN) 10 MG tablet Take 10 mg by mouth at bedtime as needed for sleep.    [provider]    Family History No family history on file.  Social History Social History  Substance Use Topics  . Smoking status: Never Smoker  . Smokeless tobacco: Never Used  . Alcohol use No  Allergies   Patient has no known allergies.   Review of Systems Review of Systems  Constitutional: Negative for fever.  HENT: Negative for congestion.   Eyes: Negative for visual disturbance.  Respiratory: Positive for shortness of breath.   Cardiovascular: Positive for chest pain. Negative for palpitations and leg swelling.  Gastrointestinal: Positive for abdominal pain, diarrhea, nausea and vomiting.  Genitourinary: Negative for dysuria.  Musculoskeletal: Positive for back pain.  Skin: Negative for rash.  Neurological: Negative for headaches.  Hematological: Does not bruise/bleed easily.    Psychiatric/Behavioral: Negative for confusion.     Physical Exam Updated Vital Signs BP (!) 137/102 (BP Location: Right Arm)   Pulse 95   Temp 98.9 F (37.2 C) (Oral)   Resp 18   SpO2 100%   Physical Exam  Constitutional: She is oriented to person, place, and time. She appears well-developed and well-nourished. No distress.  HENT:  Head: Normocephalic and atraumatic.  Mouth/Throat: Oropharynx is clear and moist.  Eyes: Conjunctivae and EOM are normal. Pupils are equal, round, and reactive to light.  Neck: Normal range of motion. Neck supple.  Cardiovascular: Normal rate, regular rhythm and normal heart sounds.   Pulmonary/Chest: Effort normal and breath sounds normal. No respiratory distress. She exhibits no tenderness.  Abdominal: Soft. Bowel sounds are normal. There is no tenderness.  Musculoskeletal: Normal range of motion.  Neurological: She is alert and oriented to person, place, and time. No cranial nerve deficit or sensory deficit. She exhibits normal muscle tone. Coordination normal.  Skin: Skin is warm.  Nursing note and vitals reviewed.    ED Treatments / Results  Labs (all labs ordered are listed, but only abnormal results are displayed) Labs Reviewed  COMPREHENSIVE METABOLIC PANEL - Abnormal; Notable for the following:       Result Value   Glucose, Bld 129 (*)    Creatinine, Ser 1.37 (*)    GFR calc non Af Amer 40 (*)    GFR calc Af Amer 46 (*)    All other components within normal limits  CBC - Abnormal; Notable for the following:    Hemoglobin 11.8 (*)    All other components within normal limits  URINALYSIS, ROUTINE W REFLEX MICROSCOPIC - Abnormal; Notable for the following:    APPearance HAZY (*)    Leukocytes, UA SMALL (*)    Bacteria, UA FEW (*)    Squamous Epithelial / LPF 6-30 (*)    All other components within normal limits  D-DIMER, QUANTITATIVE (NOT AT [facility]) - Abnormal; Notable for the following:    D-Dimer, Quant 2.13 (*)    All other  components within normal limits  LIPASE, BLOOD  TROPONIN I    EKG  EKG Interpretation  Date/Time:  Thursday Mar 19 2017 18:20:11 EDT Ventricular Rate:  83 PR Interval:    QRS Duration: 104 QT Interval:  425 QTC Calculation: 500 R Axis:   -5 Text Interpretation:  Sinus rhythm Ventricular premature complex Low voltage, precordial leads RSR' in V1 or V2, probably normal variant Minimal ST elevation, lateral leads Borderline prolonged QT interval Baseline wander in lead(s) V6 No significant change since last tracing Confirmed by Vanetta Mulders [phone]) on 03/19/2017 6:29:51 PM       Radiology Dg Chest 2 View  Result Date: 03/19/2017 CLINICAL DATA:  Pain between the scapula EXAM: CHEST  2 VIEW COMPARISON:  01/15/2017, 08/26/2016 FINDINGS: Mildly low lung volume. No acute consolidation or pleural effusion. Stable cardiomediastinal silhouette with tortuous aorta. No pneumothorax. IMPRESSION:  Mildly low lung volumes.  No acute interval changes. Electronically Signed   By: Jasmine Pang M.D.   On: 03/19/2017 19:01   Ct Angio Chest Pe W/cm &/or Wo Cm  Result Date: 03/19/2017 CLINICAL DATA:  Shortness of breath, chest pain and abdominal pain. Elevated D-dimer. EXAM: CT ANGIOGRAPHY CHEST CT ABDOMEN AND PELVIS WITH CONTRAST TECHNIQUE: Multidetector CT imaging of the chest was performed using the standard protocol during bolus administration of intravenous contrast. Multiplanar CT image reconstructions and MIPs were obtained to evaluate the vascular anatomy. Multidetector CT imaging of the abdomen and pelvis was performed using the standard protocol during bolus administration of intravenous contrast. CONTRAST:  100 mL Isovue 370 COMPARISON:  None. FINDINGS: CTA CHEST FINDINGS Cardiovascular: Contrast injection is sufficient to demonstrate satisfactory opacification of the pulmonary arteries to the segmental level. There is no pulmonary embolus. The main pulmonary artery is within normal limits for  size. There is no CT evidence of acute right heart strain. No aortic dissection or other acute abnormality. There is a normal variant aortic arch branching pattern with the brachiocephalic and left common carotid arteries sharing a common origin. Heart size is normal, without pericardial effusion. Mediastinum/Nodes: No mediastinal, hilar or axillary lymphadenopathy. The visualized thyroid and thoracic esophageal course are unremarkable. Lungs/Pleura: No pulmonary nodules or masses. No pleural effusion or pneumothorax. No focal airspace consolidation. No focal pleural abnormality. Review of the MIP images confirms the above findings. CT ABDOMEN and PELVIS FINDINGS Hepatobiliary: Normal hepatic size and contours. No perihepatic ascites. No intra- or extrahepatic biliary dilatation. Normal gallbladder. Pancreas: Normal pancreatic contours. No peripancreatic fluid collection or pancreatic ductal dilatation. Spleen: Normal. Adrenals/Urinary Tract: Normal adrenal glands. No hydronephrosis or nephrolithiasis. No abnormal perinephric stranding. No ureteral obstruction. Stomach/Bowel: There is no hiatal hernia. The stomach and duodenum are normal. There is no dilated small bowel or enteric inflammation. There is no colonic abnormality. The appendix is normal. Vascular/Lymphatic: There is atherosclerotic calcification of the non aneurysmal abdominal aorta. No abdominal or pelvic adenopathy. Reproductive: Normal uterus.  No adnexal mass. Musculoskeletal: Multilevel lower lumbar facet arthrosis. Normal visualized extrathoracic and extraperitoneal soft tissues. Other: No contributory non-categorized findings. IMPRESSION: 1. No pulmonary embolus or acute aortic syndrome. 2. No acute abnormality of the chest, abdomen or pelvis. 3. Aortic atherosclerosis. Electronically Signed   By: Deatra Robinson M.D.   On: 03/19/2017 21:55   Ct Abdomen Pelvis W Contrast  Result Date: 03/19/2017 CLINICAL DATA:  Shortness of breath, chest pain  and abdominal pain. Elevated D-dimer. EXAM: CT ANGIOGRAPHY CHEST CT ABDOMEN AND PELVIS WITH CONTRAST TECHNIQUE: Multidetector CT imaging of the chest was performed using the standard protocol during bolus administration of intravenous contrast. Multiplanar CT image reconstructions and MIPs were obtained to evaluate the vascular anatomy. Multidetector CT imaging of the abdomen and pelvis was performed using the standard protocol during bolus administration of intravenous contrast. CONTRAST:  100 mL Isovue 370 COMPARISON:  None. FINDINGS: CTA CHEST FINDINGS Cardiovascular: Contrast injection is sufficient to demonstrate satisfactory opacification of the pulmonary arteries to the segmental level. There is no pulmonary embolus. The main pulmonary artery is within normal limits for size. There is no CT evidence of acute right heart strain. No aortic dissection or other acute abnormality. There is a normal variant aortic arch branching pattern with the brachiocephalic and left common carotid arteries sharing a common origin. Heart size is normal, without pericardial effusion. Mediastinum/Nodes: No mediastinal, hilar or axillary lymphadenopathy. The visualized thyroid and thoracic esophageal course are unremarkable. Lungs/Pleura:  No pulmonary nodules or masses. No pleural effusion or pneumothorax. No focal airspace consolidation. No focal pleural abnormality. Review of the MIP images confirms the above findings. CT ABDOMEN and PELVIS FINDINGS Hepatobiliary: Normal hepatic size and contours. No perihepatic ascites. No intra- or extrahepatic biliary dilatation. Normal gallbladder. Pancreas: Normal pancreatic contours. No peripancreatic fluid collection or pancreatic ductal dilatation. Spleen: Normal. Adrenals/Urinary Tract: Normal adrenal glands. No hydronephrosis or nephrolithiasis. No abnormal perinephric stranding. No ureteral obstruction. Stomach/Bowel: There is no hiatal hernia. The stomach and duodenum are normal.  There is no dilated small bowel or enteric inflammation. There is no colonic abnormality. The appendix is normal. Vascular/Lymphatic: There is atherosclerotic calcification of the non aneurysmal abdominal aorta. No abdominal or pelvic adenopathy. Reproductive: Normal uterus.  No adnexal mass. Musculoskeletal: Multilevel lower lumbar facet arthrosis. Normal visualized extrathoracic and extraperitoneal soft tissues. Other: No contributory non-categorized findings. IMPRESSION: 1. No pulmonary embolus or acute aortic syndrome. 2. No acute abnormality of the chest, abdomen or pelvis. 3. Aortic atherosclerosis. Electronically Signed   By: Deatra Robinson M.D.   On: 03/19/2017 21:55    Procedures Procedures (including critical care time)  Medications Ordered in ED Medications  0.9 %  sodium chloride infusion ( Intravenous Stopped 03/20/17 0005)  sodium chloride 0.9 % bolus 500 mL (0 mLs Intravenous Stopped 03/19/17 2057)  ondansetron (ZOFRAN) injection 4 mg (4 mg Intravenous Given 03/19/17 1945)  iopamidol (ISOVUE-370) 76 % injection (100 mLs  Contrast Given 03/19/17 2116)     Initial Impression / Assessment and Plan / ED Course  I have reviewed the triage vital signs and the nursing notes.  Pertinent labs & imaging results that were available during my care of the patient were reviewed by me and considered in my medical decision making (see chart for details).     Patient initial complaint on triage was abdominal pain and diarrhea. For the past week. But when I went in to see patient patient stated that that had resolved and her main concern was shortness of breath and chest pain upper part of the chest going bilaterally to the back and into both arms. She stated that they've been present for a month. Associated with some shortness of breath.  Extensive workup included CT chest because her d-dimer was elevated no evidence of pulmonary embolus no other acute pulmonary findings. Also did CT abdomen and  pelvis at same time without any acute findings. Patient seemed to improve here without any specific treatment. Patient nontoxic no acute distress. In addition patient's troponin was negative no evidence of an acute cardiac event. Patient stable for discharge home and follow-up with her doctor.    Final Clinical Impressions(s) / ED Diagnoses   Final diagnoses:  Generalized abdominal pain  Precordial pain  SOB (shortness of breath)    New Prescriptions New Prescriptions   No medications on file     Vanetta Mulders, MD 03/20/17 0025

## 2017-03-20 NOTE — Discharge Instructions (Signed)
Extensive workup here today without any acute findings. CT chest was negative workup for any cardiac  event or heart attack was negative. CT abdomen and pelvis was also negative for any acute findings. Recommend close follow-up with your doctors. May need a colonoscopy. Return for any new or worse symptoms.

## 2017-04-27 ENCOUNTER — Encounter (HOSPITAL_COMMUNITY): Payer: Self-pay | Admitting: Pharmacy Technician

## 2017-04-27 ENCOUNTER — Emergency Department (HOSPITAL_COMMUNITY)
Admission: EM | Admit: 2017-04-27 | Discharge: 2017-04-27 | Disposition: A | Discharge status: Home / Self Care | Payer: Medicare Other | Attending: Emergency Medicine | Admitting: Emergency Medicine

## 2017-04-27 DIAGNOSIS — I951 Orthostatic hypotension: Secondary | ICD-10-CM | POA: Diagnosis not present

## 2017-04-27 DIAGNOSIS — Z79899 Other long term (current) drug therapy: Secondary | ICD-10-CM | POA: Diagnosis not present

## 2017-04-27 DIAGNOSIS — N183 Chronic kidney disease, stage 3 (moderate): Secondary | ICD-10-CM | POA: Diagnosis not present

## 2017-04-27 DIAGNOSIS — R55 Syncope and collapse: Secondary | ICD-10-CM | POA: Diagnosis present

## 2017-04-27 LAB — COMPREHENSIVE METABOLIC PANEL
ALT: 19 U/L (ref 14–54)
AST: 40 U/L (ref 15–41)
Albumin: 3.5 g/dL (ref 3.5–5.0)
Alkaline Phosphatase: 107 U/L (ref 38–126)
Anion gap: 10 (ref 5–15)
BUN: 29 mg/dL — ABNORMAL HIGH (ref 6–20)
CO2: 23 mmol/L (ref 22–32)
Calcium: 8.6 mg/dL — ABNORMAL LOW (ref 8.9–10.3)
Chloride: 106 mmol/L (ref 101–111)
Creatinine, Ser: 1.61 mg/dL — ABNORMAL HIGH (ref 0.44–1.00)
GFR calc Af Amer: 38 mL/min — ABNORMAL LOW (ref >60)
GFR calc non Af Amer: 33 mL/min — ABNORMAL LOW (ref >60)
Glucose, Bld: 124 mg/dL — ABNORMAL HIGH (ref 65–99)
Potassium: 3.6 mmol/L (ref 3.5–5.1)
Sodium: 139 mmol/L (ref 135–145)
Total Bilirubin: 0.6 mg/dL (ref 0.3–1.2)
Total Protein: 6.7 g/dL (ref 6.5–8.1)

## 2017-04-27 LAB — CBC WITH DIFFERENTIAL/PLATELET
Basophils Absolute: 0 10*3/uL (ref 0.0–0.1)
Basophils Relative: 0 %
Eosinophils Absolute: 0.2 10*3/uL (ref 0.0–0.7)
Eosinophils Relative: 3 %
HCT: 33.7 % — ABNORMAL LOW (ref 36.0–46.0)
Hemoglobin: 11 g/dL — ABNORMAL LOW (ref 12.0–15.0)
Lymphocytes Relative: 33 %
Lymphs Abs: 1.8 10*3/uL (ref 0.7–4.0)
MCH: 30.2 pg (ref 26.0–34.0)
MCHC: 32.6 g/dL (ref 30.0–36.0)
MCV: 92.6 fL (ref 78.0–100.0)
Monocytes Absolute: 0.3 10*3/uL (ref 0.1–1.0)
Monocytes Relative: 5 %
Neutro Abs: 3.4 10*3/uL (ref 1.7–7.7)
Neutrophils Relative %: 59 %
Platelets: 207 10*3/uL (ref 150–400)
RBC: 3.64 MIL/uL — ABNORMAL LOW (ref 3.87–5.11)
RDW: 14.2 % (ref 11.5–15.5)
WBC: 5.6 10*3/uL (ref 4.0–10.5)

## 2017-04-27 LAB — TROPONIN I: Troponin I: <0.03 ng/mL (ref <0.03)

## 2017-04-27 MED ORDER — SODIUM CHLORIDE 0.9 % IV SOLN: INTRAVENOUS | Status: DC

## 2017-04-27 MED ORDER — SODIUM CHLORIDE 0.9 % IV BOLUS (SEPSIS)
2000.0000 mL | Freq: Once | INTRAVENOUS | Status: AC
  Administered 2017-04-27: 2000 mL via INTRAVENOUS

## 2017-04-27 NOTE — ED Notes (Signed)
Pt states she feels much better and requesting to be discharged.

## 2017-04-27 NOTE — ED Notes (Signed)
ED Provider at bedside. 

## 2017-04-27 NOTE — ED Provider Notes (Signed)
[facility] Provider Note   CSN: [id number] Arrival date & time: 04/27/17  1800     History   Chief Complaint Chief Complaint  Patient presents with  . Near Syncope    HPI Michelle Graves is a 66 y.o. female.  65 year old female presents with near syncopal episode just prior to arrival. States that she had been outside for approximately 1 hour and became lightheaded and dizzy while sitting down. Denied any associated chest pain or chest pressure. She is not dyspneic. Denies any recent history of volume loss. No abdominal discomfort. No recent medication changes. Denies severe headaches. Felt like she was going to pass EMS was called and blood pressure was 80/40. Was given 400 mL of IV fluids and feels better. Repeat blood pressure was 112/76. Heart rate was 86. CBG was 134. Complains of diffuse weakness at this time. Notes normal oral intake.      Past Medical History:  Diagnosis Date  . Arthritis   . Chest pain   . Diverticulitis   . Hypertension     Patient Active Problem List   Diagnosis Date Noted  . Diverticulitis of intestine without perforation or abscess without bleeding   . Sinus tachycardia 08/27/2016  . Diverticulitis 08/26/2016  . HYPERTENSION, MALIGNANT ESSENTIAL 06/24/2007  . KIDNEY DISEASE, CHRONIC, STAGE III 06/24/2007  . ARTHRITIS, RHEUMATOID, SEROPOSITIVE 06/24/2007    History reviewed. No pertinent surgical history.  OB History    No data available       Home Medications    Prior to Admission medications   Medication Sig Start Date End Date Taking? Authorizing Provider  amitriptyline (ELAVIL) 100 MG tablet Take 100 mg by mouth at bedtime. 06/16/16   [provider]  amLODipine (NORVASC) 10 MG tablet Take 10 mg by mouth daily.    [provider]  Calcium Carbonate-Vitamin D (CALCIUM-D PO) Take 1 tablet by mouth daily.    [provider]  ciprofloxacin (CIPRO) 500 MG tablet Take 1 tablet (500 mg total) by  mouth 2 (two) times daily. For 4 days Patient not taking: Reported on 01/15/2017 08/30/16   Zannie Cove, MD  folic acid (FOLVITE) 1 MG tablet Take 1 mg by mouth daily.    [provider]  hydrochlorothiazide (HYDRODIURIL) 25 MG tablet Take 25 mg by mouth daily. 06/16/16   [provider]  hydroxychloroquine (PLAQUENIL) 200 MG tablet Take 200 mg by mouth 2 (two) times daily.  06/16/16   [provider]  lisinopril (PRINIVIL,ZESTRIL) 20 MG tablet Take 20 mg by mouth daily.    [provider]  meclizine (ANTIVERT) 25 MG tablet Take 1 tablet (25 mg total) by mouth 3 (three) times daily as needed for dizziness. 01/15/17   Rolland Porter, MD  metoprolol (LOPRESSOR) 50 MG tablet Take 50 mg by mouth 2 (two) times daily. 06/16/16   [provider]  metroNIDAZOLE (FLAGYL) 500 MG tablet Take 1 tablet (500 mg total) by mouth every 8 (eight) hours. For 4days Patient not taking: Reported on 01/15/2017 08/30/16   Zannie Cove, MD  Multiple Vitamin (MULTIVITAMIN WITH MINERALS) TABS tablet Take 1 tablet by mouth daily.    [provider]  omeprazole (PRILOSEC) 20 MG capsule Take 20 mg by mouth 2 (two) times daily. 06/16/16   [provider]  traZODone (DESYREL) 100 MG tablet Take 100 mg by mouth at bedtime.    [provider]  zolpidem (AMBIEN) 10 MG tablet Take 10 mg by mouth at bedtime as needed  for sleep.    [provider]    Family History No family history on file.  Social History Social History  Substance Use Topics  . Smoking status: Never Smoker  . Smokeless tobacco: Never Used  . Alcohol use No     Allergies   Patient has no known allergies.   Review of Systems Review of Systems  All other systems reviewed and are negative.    Physical Exam Updated Vital Signs BP 115/82 (BP Location: Right Arm)   Pulse 90   Temp 98.2 F (36.8 C) (Oral)   Resp 17   Ht 1.702 m (5\' 7" )   Wt 90.7 kg (200 lb)   SpO2 98%    BMI 31.32 kg/m   Physical Exam  Constitutional: She is oriented to person, place, and time. She appears well-developed and well-nourished.  Non-toxic appearance. No distress.  HENT:  Head: Normocephalic and atraumatic.  Eyes: Conjunctivae, EOM and lids are normal. Pupils are equal, round, and reactive to light.  Neck: Normal range of motion. Neck supple. No tracheal deviation present. No thyroid mass present.  Cardiovascular: Normal rate, regular rhythm and normal heart sounds.  Exam reveals no gallop.   No murmur heard. Pulmonary/Chest: Effort normal and breath sounds normal. No stridor. No respiratory distress. She has no decreased breath sounds. She has no wheezes. She has no rhonchi. She has no rales.  Abdominal: Soft. Normal appearance and bowel sounds are normal. She exhibits no distension. There is no tenderness. There is no rebound and no CVA tenderness.  Musculoskeletal: Normal range of motion. She exhibits no edema or tenderness.  Neurological: She is alert and oriented to person, place, and time. She has normal strength. No cranial nerve deficit or sensory deficit. GCS eye subscore is 4. GCS verbal subscore is 5. GCS motor subscore is 6.  Skin: Skin is warm and dry. No abrasion and no rash noted.  Psychiatric: She has a normal mood and affect. Her speech is normal and behavior is normal.  Nursing note and vitals reviewed.    ED Treatments / Results  Labs (all labs ordered are listed, but only abnormal results are displayed) Labs Reviewed  CBC WITH DIFFERENTIAL/PLATELET  COMPREHENSIVE METABOLIC PANEL  TROPONIN I    EKG  EKG Interpretation  Date/Time:  Monday April 27 2017 18:07:04 EDT Ventricular Rate:  90 PR Interval:    QRS Duration: 97 QT Interval:  383 QTC Calculation: 469 R Axis:   -9 Text Interpretation:  Sinus rhythm Consider left atrial enlargement Low voltage, precordial leads No significant change since last tracing Confirmed by [id number] (Lorre Nick) on  04/27/2017 6:48:41 PM       Radiology No results found.  Procedures Procedures (including critical care time)  Medications Ordered in ED Medications  0.9 %  sodium chloride infusion (not administered)  sodium chloride 0.9 % bolus 2,000 mL (not administered)     Initial Impression / Assessment and Plan / ED Course  I have reviewed the triage vital signs and the nursing notes.  Pertinent labs & imaging results that were available during my care of the patient were reviewed by me and considered in my medical decision making (see chart for details).     Patient given IV fluids here and blood pressure has improved. Suspect her symptoms are from her blood pressure medication as instructed to call her doctor tomorrow for medication adjustment. Repeat orthostatics have improved.  Final Clinical Impressions(s) / ED Diagnoses   Final diagnoses:  None  New Prescriptions New Prescriptions   No medications on file     Lorre Nick, MD 04/27/17 2148

## 2017-04-27 NOTE — ED Triage Notes (Signed)
Pt arrives to the ED via EMS with reports of near syncope. Pt had spent some time outside and suddenly felt weak/fain/dizzy. On EMS arrival pt was cool and clammy. Pt was hypotensive on EMS arrival at 80/40. Pt other VSS. EMS gave 400cc NS en route. Last vitals with EMS: BP 112/76, HR 86, CBG 134, 97% RA. Pt A&OX4 on arrival. Pt only complaint is weakness.

## 2017-07-01 ENCOUNTER — Other Ambulatory Visit: Payer: Self-pay | Admitting: Internal Medicine

## 2017-07-01 DIAGNOSIS — Z1231 Encounter for screening mammogram for malignant neoplasm of breast: Secondary | ICD-10-CM

## 2017-07-08 ENCOUNTER — Ambulatory Visit
Admission: RE | Admit: 2017-07-08 | Discharge: 2017-07-08 | Disposition: A | Discharge status: Home / Self Care | Payer: Medicare Other | Source: Ambulatory Visit | Attending: Internal Medicine | Admitting: Internal Medicine

## 2017-07-08 DIAGNOSIS — Z1231 Encounter for screening mammogram for malignant neoplasm of breast: Secondary | ICD-10-CM

## 2017-08-10 ENCOUNTER — Ambulatory Visit
Admission: RE | Admit: 2017-08-10 | Discharge: 2017-08-10 | Disposition: A | Discharge status: Home / Self Care | Payer: Medicare Other | Source: Ambulatory Visit | Attending: Gastroenterology | Admitting: Gastroenterology

## 2017-08-10 ENCOUNTER — Other Ambulatory Visit: Payer: Self-pay | Admitting: Gastroenterology

## 2017-08-10 DIAGNOSIS — R140 Abdominal distension (gaseous): Secondary | ICD-10-CM

## 2017-09-09 ENCOUNTER — Ambulatory Visit (INDEPENDENT_AMBULATORY_CARE_PROVIDER_SITE_OTHER): Payer: Self-pay | Admitting: Specialist

## 2017-10-12 ENCOUNTER — Other Ambulatory Visit (HOSPITAL_COMMUNITY): Payer: Self-pay | Admitting: Internal Medicine

## 2017-10-12 ENCOUNTER — Encounter (HOSPITAL_COMMUNITY): Payer: Medicare Other

## 2017-10-12 DIAGNOSIS — M79604 Pain in right leg: Secondary | ICD-10-CM

## 2017-10-22 ENCOUNTER — Ambulatory Visit (INDEPENDENT_AMBULATORY_CARE_PROVIDER_SITE_OTHER): Payer: Self-pay | Admitting: Specialist
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
## 2017-11-05 ENCOUNTER — Ambulatory Visit (HOSPITAL_COMMUNITY)
Admission: RE | Admit: 2017-11-05 | Discharge: 2017-11-05 | Disposition: A | Discharge status: Home / Self Care | Payer: Medicare Other | Source: Ambulatory Visit | Attending: Vascular Surgery | Admitting: Vascular Surgery

## 2017-11-05 DIAGNOSIS — M79604 Pain in right leg: Secondary | ICD-10-CM | POA: Diagnosis not present

## 2017-11-30 ENCOUNTER — Encounter (INDEPENDENT_AMBULATORY_CARE_PROVIDER_SITE_OTHER): Payer: Self-pay | Admitting: Specialist

## 2017-11-30 ENCOUNTER — Ambulatory Visit (INDEPENDENT_AMBULATORY_CARE_PROVIDER_SITE_OTHER): Payer: Medicare Other | Admitting: Specialist

## 2017-11-30 ENCOUNTER — Ambulatory Visit (INDEPENDENT_AMBULATORY_CARE_PROVIDER_SITE_OTHER): Payer: Medicare Other

## 2017-11-30 VITALS — BP 133/94 | HR 80 | Ht 67.0 in | Wt 204.0 lb

## 2017-11-30 DIAGNOSIS — M5441 Lumbago with sciatica, right side: Secondary | ICD-10-CM | POA: Diagnosis not present

## 2017-11-30 DIAGNOSIS — M47816 Spondylosis without myelopathy or radiculopathy, lumbar region: Secondary | ICD-10-CM

## 2017-11-30 DIAGNOSIS — G8929 Other chronic pain: Secondary | ICD-10-CM

## 2017-11-30 DIAGNOSIS — M461 Sacroiliitis, not elsewhere classified: Secondary | ICD-10-CM | POA: Diagnosis not present

## 2017-11-30 DIAGNOSIS — M5442 Lumbago with sciatica, left side: Secondary | ICD-10-CM | POA: Diagnosis not present

## 2017-11-30 DIAGNOSIS — M4316 Spondylolisthesis, lumbar region: Secondary | ICD-10-CM | POA: Diagnosis not present

## 2017-11-30 MED ORDER — ACETAMINOPHEN-CODEINE #3 300-30 MG PO TABS: 1.0000 | ORAL_TABLET | Freq: Four times a day (QID) | ORAL | 0 refills | Status: DC | PRN

## 2017-11-30 MED ORDER — DULOXETINE HCL 20 MG PO CPEP: 20.0000 mg | ORAL_CAPSULE | Freq: Every day | ORAL | 3 refills | Status: DC

## 2017-11-30 NOTE — Patient Instructions (Addendum)
Avoid bending, stooping and avoid lifting weights greater than 10 lbs. Avoid prolong standing and walking. Avoid frequent bending and stooping  No lifting greater than 10 lbs. May use ice or moist heat for pain. Weight loss is of benefit. Handicap license is approved. Dr. Fairbank Blas secretary/Assistant will call to arrange for facet steroid injections and then  Determine if a radiofrequency ablation of the nerves would be of benefit.   Try Cymbalt (duloxetine) 20 mg daily Tylenol #3 for pain.

## 2017-11-30 NOTE — Progress Notes (Signed)
Office Visit Note   Patient: Michelle Graves           Date of Birth: [birth year]           MRN: [id number] Visit Date: 11/30/2017              Requested by: [facility] [address] [place], [place] [address] PCP: [facility]   Assessment & Plan: Visit Diagnoses:  1. Chronic bilateral low back pain with bilateral sciatica   2. Spondylosis without myelopathy or radiculopathy, lumbar region   3. Sacroiliac inflammation (HCC)   4. Spondylolisthesis, lumbar region     Plan: Avoid bending, stooping and avoid lifting weights greater than 10 lbs. Avoid prolong standing and walking. Avoid frequent bending and stooping  No lifting greater than 10 lbs. May use ice or moist heat for pain. Weight loss is of benefit. Handicap license is approved. Dr. [place] Blas secretary/Assistant will call to arrange for facet steroid injections and then  Determine if a radiofrequency ablation of the nerves would be of benefit. Try Cymbalt (duloxetine) 20 mg daily Tylenol #3 for pain.  Follow-Up Instructions: Return in about 4 weeks (around 12/28/2017).   Orders:  Orders Placed This Encounter  Procedures  . XR Lumbar Spine 2-3 Views   No orders of the defined types were placed in this encounter.     Procedures: No procedures performed   Clinical Data: No additional findings.   Subjective: Chief Complaint  Patient presents with  . Lower Back - Pain    67 year old female with history of back pain and difficulty standing and walking. Tending to stoop. Has pain with walking, can only walk about a half a mile.usually with her grandchildren. Pain into the legs with prolong standing and walking. Standing over dishes and food preparation are okay she splits them up. No bowel or bladder difficulty. She has been taking some gabapentin but without help. She has a history of RA of the hands affecting mainly the thumbs. No numbness or  paresthesias. Once in a while  She will get a sharp pain in the feet. She has HTN.  Has a sister with RA with more hand deformity. Has tried gabapentin and tramadol and diclofenac without help.      Review of Systems  Constitutional: Negative for activity change, appetite change, chills, diaphoresis, fatigue, fever and unexpected weight change.  HENT: Negative for congestion, ear discharge, ear pain, facial swelling, hearing loss, mouth sores, nosebleeds, postnasal drip, rhinorrhea, sinus pressure, sinus pain, sneezing, sore throat, tinnitus, trouble swallowing and voice change.   Eyes: Negative for photophobia, pain, redness and itching.  Respiratory: Negative for apnea, cough, choking, chest tightness, shortness of breath, wheezing and stridor.   Cardiovascular: Negative for chest pain, palpitations and leg swelling.  Gastrointestinal: Negative for abdominal distention, abdominal pain, diarrhea, nausea, rectal pain and vomiting.  Genitourinary: Negative for difficulty urinating, dysuria, flank pain, frequency, hematuria and pelvic pain.  Musculoskeletal: Positive for arthralgias, back pain and gait problem. Negative for myalgias, neck pain and neck stiffness.  Skin: Negative for color change, pallor, rash and wound.  Neurological: Positive for weakness and headaches. Negative for dizziness, tremors, seizures, syncope, facial asymmetry, speech difficulty, light-headedness and numbness.  Hematological: Negative for adenopathy. Does not bruise/bleed easily.  Psychiatric/Behavioral: Negative for agitation, behavioral problems, confusion, decreased concentration, dysphoric mood, hallucinations, self-injury, sleep disturbance and suicidal ideas. The patient is not nervous/anxious and is not hyperactive.  Objective: Vital Signs: BP (!) 133/94 (BP Location: Left Arm, Patient Position: Sitting)   Pulse 80   Ht 5\' 7"  (1.702 m)   Wt 204 lb (92.5 kg)   BMI 31.95 kg/m   Physical Exam  Ortho  Exam  Specialty Comments:  No specialty comments available.  Imaging: Xr Lumbar Spine 2-3 Views  Result Date: 11/30/2017 AP and Lateral flexion and extension radiographs show grade 1 anterolisthesis L4-5, slight L3-4. There is left greater than right SI joint sclerosis and left SI partial fusion superiorly. The right shows one area over the superior joint line. Hips with mild DJD narrowing inferomedially right greater than left. Spondylosis.    PMFS History: Patient Active Problem List   Diagnosis Date Noted  . Diverticulitis of intestine without perforation or abscess without bleeding   . Sinus tachycardia 08/27/2016  . Diverticulitis 08/26/2016  . HYPERTENSION, MALIGNANT ESSENTIAL 06/24/2007  . KIDNEY DISEASE, CHRONIC, STAGE III 06/24/2007  . ARTHRITIS, RHEUMATOID, SEROPOSITIVE 06/24/2007   Past Medical History:  Diagnosis Date  . Arthritis   . Chest pain   . Diverticulitis   . Hypertension     History reviewed. No pertinent family history.  History reviewed. No pertinent surgical history. Social History   Occupational History  . Not on file  Tobacco Use  . Smoking status: Never Smoker  . Smokeless tobacco: Never Used  Substance and Sexual Activity  . Alcohol use: No  . Drug use: No  . Sexual activity: Not on file

## 2017-12-11 ENCOUNTER — Other Ambulatory Visit: Payer: Self-pay | Admitting: Internal Medicine

## 2017-12-11 DIAGNOSIS — Z139 Encounter for screening, unspecified: Secondary | ICD-10-CM

## 2017-12-14 ENCOUNTER — Ambulatory Visit (INDEPENDENT_AMBULATORY_CARE_PROVIDER_SITE_OTHER): Payer: Medicare Other

## 2017-12-14 ENCOUNTER — Ambulatory Visit (INDEPENDENT_AMBULATORY_CARE_PROVIDER_SITE_OTHER): Payer: Medicare Other | Admitting: Physical Medicine and Rehabilitation

## 2017-12-14 VITALS — BP 110/77 | HR 117 | Temp 97.6°F

## 2017-12-14 DIAGNOSIS — M47816 Spondylosis without myelopathy or radiculopathy, lumbar region: Secondary | ICD-10-CM | POA: Diagnosis not present

## 2017-12-14 DIAGNOSIS — G8929 Other chronic pain: Secondary | ICD-10-CM

## 2017-12-14 DIAGNOSIS — M545 Low back pain, unspecified: Secondary | ICD-10-CM

## 2017-12-14 NOTE — Progress Notes (Deleted)
Pt states a constant dull pain in lower back and pain in both right and left knee. Pt states pain has been going on for about 2 years. Pt states nothing makes pain worse, heating pad makes pain better. +Driver, -BT, -Dye Allergies.

## 2017-12-23 NOTE — Procedures (Signed)
Lumbar Diagnostic Facet Joint Nerve Block with Fluoroscopic Guidance   Patient: Michelle Graves      Date of Birth: 1951/04/12 MRN: [id number] PCP: [facility]      Visit Date: 12/14/2017   Universal Protocol:    Date/Time: 02/27/196:02 AM  Consent Given By: the patient  Position: PRONE  Additional Comments: Vital signs were monitored before and after the procedure. Patient was prepped and draped in the usual sterile fashion. The correct patient, procedure, and site was verified.   Injection Procedure Details:  Procedure Site One Meds Administered: No orders of the defined types were placed in this encounter.    Laterality: Bilateral  Location/Site:  L4-L5 L5-S1  Needle size: 22 ga.  Needle type:spinal  Needle Placement: Oblique pedical  Findings:   -Comments: There was excellent flow of contrast along the articular pillars without intravascular flow.  Procedure Details: The fluoroscope beam is vertically oriented in AP and then obliqued 15 to 20 degrees to the ipsilateral side of the desired nerve to achieve the "Scotty dog" appearance.  The skin over the target area of the junction of the superior articulating process and the transverse process (sacral ala if blocking the L5 dorsal rami) was locally anesthetized with a 1 ml volume of 1% Lidocaine without Epinephrine.  The spinal needle was inserted and advanced in a trajectory view down to the target.   After contact with periosteum and negative aspirate for blood and CSF, correct placement without intravascular or epidural spread was confirmed by injecting 0.5 ml. of Isovue-250.  A spot radiograph was obtained of this image.    Next, a 0.5 ml. volume of the injectate described above was injected. The needle was then redirected to the other facet joint nerves mentioned above if needed.  Prior to the procedure, the patient was given a Pain Diary which was completed for baseline  measurements.  After the procedure, the patient rated their pain every 30 minutes and will continue rating at this frequency for a total of 5 hours.  The patient has been asked to complete the Diary and return to [place] by mail, fax or hand delivered as soon as possible.   Additional Comments:  The patient tolerated the procedure well Dressing: Band-Aid    Post-procedure details: Patient was observed during the procedure. Post-procedure instructions were reviewed.  Patient left the clinic in stable condition.
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
## 2017-12-28 ENCOUNTER — Ambulatory Visit (INDEPENDENT_AMBULATORY_CARE_PROVIDER_SITE_OTHER): Payer: Medicare Other | Admitting: Specialist

## 2017-12-28 ENCOUNTER — Encounter (INDEPENDENT_AMBULATORY_CARE_PROVIDER_SITE_OTHER): Payer: Self-pay | Admitting: Specialist

## 2017-12-28 VITALS — BP 153/95 | HR 77 | Ht 67.0 in | Wt 204.0 lb

## 2017-12-28 DIAGNOSIS — M4316 Spondylolisthesis, lumbar region: Secondary | ICD-10-CM | POA: Diagnosis not present

## 2017-12-28 DIAGNOSIS — M48062 Spinal stenosis, lumbar region with neurogenic claudication: Secondary | ICD-10-CM | POA: Diagnosis not present

## 2017-12-28 MED ORDER — HYDROCODONE-ACETAMINOPHEN 5-325 MG PO TABS: 1.0000 | ORAL_TABLET | Freq: Four times a day (QID) | ORAL | 0 refills | Status: DC | PRN

## 2017-12-28 NOTE — Progress Notes (Signed)
Office Visit Note   Patient: Michelle Graves           Date of Birth: [birth year]           MRN: [id number] Visit Date: 12/28/2017              Requested by: [facility] [address] [place], [place] [address] PCP: [facility]   Assessment & Plan: Visit Diagnoses:  1. Spinal stenosis of lumbar region with neurogenic claudication   2. Spondylolisthesis, lumbar region     Plan: Avoid bending, stooping and avoid lifting weights greater than 10 lbs. Avoid prolong standing and walking. Avoid frequent bending and stooping  No lifting greater than 10 lbs. May use ice or moist heat for pain. Weight loss is of benefit. Handicap license is approved. Dr. [place] Blas secretary/Assistant will call to arrange for facets injection  And consideration of facet radiofrequency ablation of the nerves that give sensation to the joints that are painful in the back.  Follow-Up Instructions: Return in about 4 weeks (around 01/25/2018).   Orders:  Orders Placed This Encounter  Procedures  . Ambulatory referral to Physical Medicine Rehab   Meds ordered this encounter  Medications  . HYDROcodone-acetaminophen (NORCO/VICODIN) 5-325 MG tablet    Sig: Take 1 tablet by mouth every 6 (six) hours as needed for moderate pain.    Dispense:  30 tablet    Refill:  0      Procedures: No procedures performed   Clinical Data: No additional findings.   Subjective: No chief complaint on file.   67 year old female with history of back pain and radiation into the lateral thighs . Seen last and she was started on tylenol #3 and she reports that it does not relieve the pain. Difficulty walking A distance.     Review of Systems  Constitutional: Negative.   HENT: Positive for rhinorrhea and sinus pain. Negative for sneezing.   Eyes: Negative.   Respiratory: Positive for cough.   Cardiovascular: Negative.   Gastrointestinal: Negative.     Endocrine: Negative.   Genitourinary: Negative.   Musculoskeletal: Negative.   Skin: Negative.   Allergic/Immunologic: Negative.   Neurological: Negative.   Hematological: Negative.   Psychiatric/Behavioral: Negative.      Objective: Vital Signs: BP (!) 153/95 (BP Location: Left Arm, Patient Position: Sitting)   Pulse 77   Ht 5\' 7"  (1.702 m)   Wt 204 lb (92.5 kg)   BMI 31.95 kg/m   Physical Exam  Constitutional: She is oriented to person, place, and time. She appears well-developed and well-nourished.  HENT:  Head: Normocephalic and atraumatic.  Eyes: EOM are normal. Pupils are equal, round, and reactive to light.  Neck: Normal range of motion. Neck supple.  Pulmonary/Chest: Effort normal and breath sounds normal.  Abdominal: Soft. Bowel sounds are normal.  Musculoskeletal: Normal range of motion.  Neurological: She is alert and oriented to person, place, and time.  Skin: Skin is warm and dry.  Psychiatric: She has a normal mood and affect. Her behavior is normal. Judgment and thought content normal.    Ortho Exam  Specialty Comments:  No specialty comments available.  Imaging: No results found.   PMFS History: Patient Active Problem List   Diagnosis Date Noted  . Diverticulitis of intestine without perforation or abscess without bleeding   . Sinus tachycardia 08/27/2016  . Diverticulitis 08/26/2016  . HYPERTENSION, MALIGNANT ESSENTIAL 06/24/2007  . KIDNEY DISEASE,  CHRONIC, STAGE III 06/24/2007  . ARTHRITIS, RHEUMATOID, SEROPOSITIVE 06/24/2007   Past Medical History:  Diagnosis Date  . Arthritis   . Chest pain   . Diverticulitis   . Hypertension     No family history on file.  No past surgical history on file. Social History   Occupational History  . Not on file  Tobacco Use  . Smoking status: Never Smoker  . Smokeless tobacco: Never Used  Substance and Sexual Activity  . Alcohol use: No  . Drug use: No  . Sexual activity: Not on file

## 2017-12-28 NOTE — Patient Instructions (Signed)
Avoid bending, stooping and avoid lifting weights greater than 10 lbs. Avoid prolong standing and walking. Avoid frequent bending and stooping  No lifting greater than 10 lbs. May use ice or moist heat for pain. Weight loss is of benefit. Handicap license is approved. Dr. [place] Blas secretary/Assistant will call to arrange for facets injection  And consideration of facet radiofrequency ablation of the nerves that give sensation to the joints that are painful in the back.

## 2018-01-20 ENCOUNTER — Encounter (INDEPENDENT_AMBULATORY_CARE_PROVIDER_SITE_OTHER): Payer: Self-pay | Admitting: Physical Medicine and Rehabilitation

## 2018-01-20 ENCOUNTER — Ambulatory Visit (INDEPENDENT_AMBULATORY_CARE_PROVIDER_SITE_OTHER): Payer: Medicare Other | Admitting: Physical Medicine and Rehabilitation

## 2018-01-20 VITALS — BP 144/106 | HR 73 | Temp 98.3°F

## 2018-01-20 DIAGNOSIS — M5442 Lumbago with sciatica, left side: Secondary | ICD-10-CM

## 2018-01-20 DIAGNOSIS — G8929 Other chronic pain: Secondary | ICD-10-CM

## 2018-01-20 DIAGNOSIS — M5441 Lumbago with sciatica, right side: Secondary | ICD-10-CM

## 2018-01-20 DIAGNOSIS — M47816 Spondylosis without myelopathy or radiculopathy, lumbar region: Secondary | ICD-10-CM

## 2018-01-20 DIAGNOSIS — M5416 Radiculopathy, lumbar region: Secondary | ICD-10-CM

## 2018-01-20 NOTE — Progress Notes (Signed)
 .  Numeric Pain Rating Scale and Functional Assessment Average Pain 7 Pain Right Now 7 My pain is dull and aching Pain is worse with: sitting Pain improves with: heat/ice   In the last MONTH (on 0-10 scale) has pain interfered with the following?  1. General activity like being  able to carry out your everyday physical activities such as walking, climbing stairs, carrying groceries, or moving a chair?  Rating(6)  2. Relation with others like being able to carry out your usual social activities and roles such as  activities at home, at work and in your community. Rating(2)  3. Enjoyment of life such that you have  been bothered by emotional problems such as feeling anxious, depressed or irritable?  Rating(0)

## 2018-01-28 ENCOUNTER — Encounter (INDEPENDENT_AMBULATORY_CARE_PROVIDER_SITE_OTHER): Payer: Self-pay | Admitting: Physical Medicine and Rehabilitation

## 2018-01-28 NOTE — Progress Notes (Signed)
Michelle Graves - 67 y.o. female MRN [id number]  Date of birth: 1951/02/17  Office Visit Note: Visit Date: 01/20/2018 PCP: [facility] Referred by: [facility] Petit*  Subjective: Chief Complaint  Patient presents with  . Lower Back - Pain  . Right Leg - Pain  . Left Leg - Pain   HPI: [name] is a very pleasant 67 year old female with chronic worsening severe low back pain that radiates to both the right and left legs.  She is followed by Dr. Otelia Sergeant who sends her here for evaluation for possible radiofrequency ablation of the facet joints.  She states that her pain is been going on for a while and just keeps getting worse despite conservative care including therapy and medication management.  We completed a prior medial branch block of the lower facet joints which gave her only 20-30% relief.  She states that the last injection just was not very helpful although it seemed to help a little bit.  She reports worsening pain getting up from a chair and trying to walk.  She does use a heating pad and medications.  She has not noted any focal weakness or bowel or bladder issues.  She is getting some pain down into the legs.  She denies any unintended weight loss or other red flag issues.  She has had imaging which is shown below and reviewed with her in the past.  This imaging amounts to plain film x-rays completed in our office as well as a CT of the abdomen and pelvis that was done last year.  She has not had an MRI of her lumbar spine.   Review of Systems  Constitutional: Negative for chills, fever, malaise/fatigue and weight loss.  HENT: Negative for hearing loss and sinus pain.   Eyes: Negative for blurred vision, double vision and photophobia.  Respiratory: Negative for cough and shortness of breath.   Cardiovascular: Negative for chest pain, palpitations and leg swelling.  Gastrointestinal: Negative for abdominal pain, nausea and vomiting.    Genitourinary: Negative for flank pain.  Musculoskeletal: Positive for back pain. Negative for myalgias.       Bilateral leg pain  Skin: Negative for itching and rash.  Neurological: Negative for tremors, focal weakness and weakness.  Endo/Heme/Allergies: Negative.   Psychiatric/Behavioral: Negative for depression.  All other systems reviewed and are negative.  Otherwise per HPI.  Assessment & Plan: Visit Diagnoses:  1. Spondylosis without myelopathy or radiculopathy, lumbar region   2. Lumbar radiculopathy   3. Chronic bilateral low back pain with bilateral sciatica     Plan: Findings:  Chronic worsening severe axial low back pain with bilateral radicular pain worse with going from sit to stand and with walking.  She does not have any red flag symptoms.  She did not get very good relief with prior facet joint/medial branch block.  She may be got 20% relief which is well below the relief we would expect other than may be a placebo effect.  She does endorse that it did seem to last very long at all.  Her pain does seem to be multifactorial with likely mechanical facet complaints and/or stenosis.  She really is not a candidate at this point to proceed with radiofrequency ablation but I guess it is still something we could look at in the future depending on where her symptoms had.  She does report 7 out of 10 pain.  I will try to discuss with Dr. Otelia Sergeant  but she may need an MRI of her lumbar spine.  Depending on that could look it may be epidural type injection.  She can return to see him pretty soon.    Meds & Orders: No orders of the defined types were placed in this encounter.  No orders of the defined types were placed in this encounter.   Follow-up: Return for Dr. Otelia Sergeant scheduled.   Procedures: No procedures performed  No notes on file   Clinical History: AP/LAT lumbar spine 11/30/17  AP and Lateral flexion and extension radiographs show grade 1  anterolisthesis L4-5, slight L3-4.  There is left greater than right SI  joint sclerosis and left SI partial fusion superiorly. The right shows one  area over the superior joint line. Hips with mild DJD narrowing  inferomedially right greater than left. Spondylosis.   She reports that she has never smoked. She has never used smokeless tobacco. No results for input(s): HGBA1C, LABURIC in the last 8760 hours.  Objective:  VS:  HT:    WT:   BMI:     BP:(!) 144/106  HR:73bpm  TEMP:98.3 F (36.8 C)(Oral)  RESP:97 % Physical Exam  Constitutional: She is oriented to person, place, and time. She appears well-developed and well-nourished.  Eyes: Pupils are equal, round, and reactive to light. Conjunctivae and EOM are normal.  Cardiovascular: Normal rate and intact distal pulses.  Pulmonary/Chest: Effort normal.  Musculoskeletal:  Patient ambulates without aid.  She is slow to rise from seated position does have pain with extension rotation of the lumbar spine.  She has some paraspinal tenderness with taut bands.  She has mild tenderness over the greater trochanters.  She has no pain with hip rotation she has good distal strength.  Neurological: She is alert and oriented to person, place, and time. She exhibits normal muscle tone. Coordination normal.  Skin: Skin is warm and dry. No rash noted. No erythema.  Psychiatric: She has a normal mood and affect. Her behavior is normal.  Nursing note and vitals reviewed.   Ortho Exam Imaging: No results found.  Past Medical/Family/Surgical/Social History: Medications & Allergies reviewed per EMR, new medications updated. Patient Active Problem List   Diagnosis Date Noted  . Diverticulitis of intestine without perforation or abscess without bleeding   . Sinus tachycardia 08/27/2016  . Diverticulitis 08/26/2016  . HYPERTENSION, MALIGNANT ESSENTIAL 06/24/2007  . KIDNEY DISEASE, CHRONIC, STAGE III 06/24/2007  . ARTHRITIS, RHEUMATOID, SEROPOSITIVE 06/24/2007   Past Medical History:    Diagnosis Date  . Arthritis   . Chest pain   . Diverticulitis   . Hypertension    History reviewed. No pertinent family history. History reviewed. No pertinent surgical history. Social History   Occupational History  . Not on file  Tobacco Use  . Smoking status: Never Smoker  . Smokeless tobacco: Never Used  Substance and Sexual Activity  . Alcohol use: No  . Drug use: No  . Sexual activity: Not on file

## 2018-01-29 ENCOUNTER — Other Ambulatory Visit (HOSPITAL_COMMUNITY)
Admission: RE | Admit: 2018-01-29 | Discharge: 2018-01-29 | Disposition: A | Discharge status: Home / Self Care | Payer: Medicare Other | Source: Ambulatory Visit | Attending: Family | Admitting: Family

## 2018-01-29 ENCOUNTER — Other Ambulatory Visit: Payer: Self-pay

## 2018-01-29 ENCOUNTER — Ambulatory Visit (INDEPENDENT_AMBULATORY_CARE_PROVIDER_SITE_OTHER): Payer: Medicare Other | Admitting: Family

## 2018-01-29 ENCOUNTER — Encounter: Payer: Self-pay | Admitting: Family

## 2018-01-29 VITALS — BP 137/91 | HR 73 | Temp 98.1°F | Ht 66.5 in | Wt 213.6 lb

## 2018-01-29 DIAGNOSIS — Z0000 Encounter for general adult medical examination without abnormal findings: Secondary | ICD-10-CM

## 2018-01-29 DIAGNOSIS — Z01419 Encounter for gynecological examination (general) (routine) without abnormal findings: Secondary | ICD-10-CM | POA: Diagnosis not present

## 2018-01-29 DIAGNOSIS — N898 Other specified noninflammatory disorders of vagina: Secondary | ICD-10-CM | POA: Insufficient documentation

## 2018-01-30 NOTE — Progress Notes (Signed)
Subjective:     Michelle Graves is a 67 y.o. female and is here for a comprehensive well woman physical exam. The patient reports problems - vaginal discharge x 2 weeks with an odor.  Currently sexually active, last occurrence 2-3 months ago.  Desires STI treament.  Receives routine mammogram and pap smears.  Both not due and already scheduled for a future visit.  Receives primary care at [facility] with regular follow-up for chronic health conditions.  .  Social History   Socioeconomic History  . Marital status: Divorced    Spouse name: Not on file  . Number of children: Not on file  . Years of education: Not on file  . Highest education level: Not on file  Occupational History  . Not on file  Social Needs  . Financial resource strain: Not on file  . Food insecurity:    Worry: Not on file    Inability: Not on file  . Transportation needs:    Medical: Not on file    Non-medical: Not on file  Tobacco Use  . Smoking status: Never Smoker  . Smokeless tobacco: Never Used  Substance and Sexual Activity  . Alcohol use: Yes    Alcohol/week: 0.6 oz    Types: 1 Shots of liquor per week    Comment: sometimes   . Drug use: No  . Sexual activity: Yes    Partners: Male    Birth control/protection: Condom  Lifestyle  . Physical activity:    Days per week: Not on file    Minutes per session: Not on file  . Stress: Not on file  Relationships  . Social connections:    Talks on phone: Not on file    Gets together: Not on file    Attends religious service: Not on file    Active member of club or organization: Not on file    Attends meetings of clubs or organizations: Not on file    Relationship status: Not on file  . Intimate partner violence:    Fear of current or ex partner: Not on file    Emotionally abused: Not on file    Physically abused: Not on file    Forced sexual activity: Not on file  Other Topics Concern  . Not on file  Social History Narrative  . Not on file   Health  Maintenance  Topic Date Due  . Hepatitis C Screening  1951/10/22  . HIV Screening  11/28/1967  . TETANUS/TDAP  11/28/1971  . COLONOSCOPY  11/27/2002  . DEXA SCAN  11/27/2017  . PNA vac Low Risk Adult (1 of 2 - PCV13) 11/27/2017  . INFLUENZA VACCINE  05/27/2018  . MAMMOGRAM  07/09/2019  . PAP SMEAR  12/13/2019    The following portions of the patient's history were reviewed and updated as appropriate: allergies, current medications, past family history, past medical history, past social history, past surgical history and problem list.  Review of Systems Pertinent items are noted in HPI.   Objective:   BP (!) 137/91 (BP Location: Left Arm, Patient Position: Sitting, Cuff Size: Large)   Pulse 73   Temp 98.1 F (36.7 C) (Oral)   Ht 5' 6.5" (1.689 m)   Wt 213 lb 9.6 oz (96.9 kg)   SpO2 92%   BMI 33.96 kg/m  General appearance: alert, cooperative and appears stated age Head: Normocephalic, without obvious abnormality, atraumatic; poor dentition  Neck: no adenopathy, no carotid bruit, no JVD, supple, symmetrical, trachea midline  and thyroid not enlarged, symmetric, no tenderness/mass/nodules Lungs: clear to auscultation bilaterally Breasts: normal appearance, no masses or tenderness, No nipple retraction or dimpling, No nipple discharge or bleeding, No axillary or supraclavicular adenopathy, Normal to palpation without dominant masses, Taught monthly breast self examination Heart: regular rate and rhythm, S1, S2 normal, no murmur, click, rub or gallop Abdomen: soft, non-tender; bowel sounds normal; no masses,  no organomegaly Pelvic: cervix normal in appearance, external genitalia normal, no adnexal masses or tenderness, no cervical motion tenderness, rectovaginal septum normal, uterus normal size, shape, and consistency and vagina normal without discharge Skin: Skin color, texture, turgor normal. No rashes or lesions       Assessment:   Well Woman Exam Vaginal Discharge      Plan:   Screening for sexually transmitted infections (GC/CT, wet prep) Referred/orderedaddressed following from Health Maintenance: Hepatitis C Screening   HIV Screening   COLONOSCOPY > referred  DEXA SCAN > referred  MAMMOGRAM > already scheduled  PAP SMEAR > not due   Will notify patient regarding results  Marlis Edelson, CNM

## 2018-01-30 NOTE — Patient Instructions (Signed)
Place annual gynecologic exam patient instructions here.

## 2018-02-01 LAB — CERVICOVAGINAL ANCILLARY ONLY
Bacterial vaginitis: NEGATIVE
Candida vaginitis: NEGATIVE
Chlamydia: NEGATIVE
Neisseria Gonorrhea: NEGATIVE
Trichomonas: NEGATIVE

## 2018-02-03 LAB — HIV ANTIBODY (ROUTINE TESTING W REFLEX): HIV Screen 4th Generation wRfx: NONREACTIVE

## 2018-02-03 LAB — HEPATITIS C ANTIBODY: Hep C Virus Ab: <0.1 s/co ratio (ref 0.0–0.9)

## 2018-02-04 ENCOUNTER — Ambulatory Visit (INDEPENDENT_AMBULATORY_CARE_PROVIDER_SITE_OTHER): Payer: Medicare Other | Admitting: Specialist

## 2018-02-04 ENCOUNTER — Encounter (INDEPENDENT_AMBULATORY_CARE_PROVIDER_SITE_OTHER): Payer: Self-pay | Admitting: Specialist

## 2018-02-04 VITALS — BP 121/81 | HR 81 | Ht 65.5 in | Wt 213.0 lb

## 2018-02-04 DIAGNOSIS — M4316 Spondylolisthesis, lumbar region: Secondary | ICD-10-CM | POA: Diagnosis not present

## 2018-02-04 DIAGNOSIS — M5136 Other intervertebral disc degeneration, lumbar region: Secondary | ICD-10-CM

## 2018-02-04 DIAGNOSIS — M4807 Spinal stenosis, lumbosacral region: Secondary | ICD-10-CM | POA: Diagnosis not present

## 2018-02-04 DIAGNOSIS — M48062 Spinal stenosis, lumbar region with neurogenic claudication: Secondary | ICD-10-CM | POA: Diagnosis not present

## 2018-02-04 MED ORDER — GABAPENTIN 300 MG PO CAPS: 300.0000 mg | ORAL_CAPSULE | Freq: Three times a day (TID) | ORAL | 3 refills | Status: DC

## 2018-02-04 MED ORDER — HYDROCODONE-ACETAMINOPHEN 5-325 MG PO TABS: 1.0000 | ORAL_TABLET | Freq: Four times a day (QID) | ORAL | 0 refills | Status: DC | PRN

## 2018-02-04 NOTE — Progress Notes (Signed)
Office Visit Note   Patient: Michelle Graves           Date of Birth: [birth year]           MRN: [id number] Visit Date: 02/04/2018              Requested by: [facility] [address] [place], [place] [address] PCP: [facility]   Assessment & Plan: Visit Diagnoses:  1. Spinal stenosis of lumbar region with neurogenic claudication   2. Spondylolisthesis, lumbar region   3. Degenerative disc disease, lumbar   4. Other intervertebral disc degeneration, lumbar region   5. Spinal stenosis of lumbosacral region     Plan: Avoid bending, stooping and avoid lifting weights greater than 10 lbs. Avoid prolong standing and walking. Avoid frequent bending and stooping  No lifting greater than 10 lbs. May use ice or moist heat for pain. Weight loss is of benefit. Handicap license is approved. MRI of lumbar spine  Follow-Up Instructions: Return in about 3 weeks (around 02/25/2018).   Orders:  No orders of the defined types were placed in this encounter.  No orders of the defined types were placed in this encounter.     Procedures: No procedures performed   Clinical Data: No additional findings.   Subjective: No chief complaint on file.   67 year old female with progressive back pain with pain radiating in to the left leg posteriorly and laterally. Pain worse with standing and walking. She feels like there is a rolling going  Down the legs. Night pain into the calfs and legs. No bowel or bladder difficlulty.    Review of Systems   Objective: Vital Signs: BP 121/81 (BP Location: Left Arm, Patient Position: Sitting)   Pulse 81   Ht 5' 5.5" (1.664 m)   Wt 213 lb (96.6 kg)   BMI 34.91 kg/m   Physical Exam  Back Exam   Tenderness  The patient is experiencing tenderness in the lumbar.  Range of Motion  Extension: abnormal  Flexion: normal  Lateral bend right: abnormal   Muscle Strength  Right  Quadriceps:  5/5  Left Quadriceps:  5/5  Right Hamstrings:  5/5  Left Hamstrings:  5/5   Reflexes  Patellar: normal Achilles: normal Babinski's sign: normal   Other  Toe walk: abnormal Heel walk: abnormal Sensation: decreased Gait: normal  Erythema: no back redness Scars: absent      Specialty Comments:  No specialty comments available.  Imaging: No results found.   PMFS History: Patient Active Problem List   Diagnosis Date Noted  . Diverticulitis of intestine without perforation or abscess without bleeding   . Sinus tachycardia 08/27/2016  . Diverticulitis 08/26/2016  . HYPERTENSION, MALIGNANT ESSENTIAL 06/24/2007  . KIDNEY DISEASE, CHRONIC, STAGE III 06/24/2007  . ARTHRITIS, RHEUMATOID, SEROPOSITIVE 06/24/2007   Past Medical History:  Diagnosis Date  . Arthritis   . Chest pain   . Diverticulitis   . Hypertension     No family history on file.  Past Surgical History:  Procedure Laterality Date  . TONSILLECTOMY     Social History   Occupational History  . Not on file  Tobacco Use  . Smoking status: Never Smoker  . Smokeless tobacco: Never Used  Substance and Sexual Activity  . Alcohol use: Yes    Alcohol/week: 0.6 oz    Types: 1 Shots of liquor per week    Comment: sometimes   . Drug  use: No  . Sexual activity: Yes    Partners: Male    Birth control/protection: Condom

## 2018-02-04 NOTE — Patient Instructions (Signed)
Avoid bending, stooping and avoid lifting weights greater than 10 lbs. Avoid prolong standing and walking. Avoid frequent bending and stooping  No lifting greater than 10 lbs. May use ice or moist heat for pain. Weight loss is of benefit. Handicap license is approved. MRI of lumbar spine

## 2018-02-11 ENCOUNTER — Other Ambulatory Visit: Payer: Self-pay | Admitting: Family

## 2018-02-11 DIAGNOSIS — N898 Other specified noninflammatory disorders of vagina: Secondary | ICD-10-CM

## 2018-02-11 MED ORDER — METRONIDAZOLE 0.75 % VA GEL: 1.0000 | Freq: Every day | VAGINAL | 1 refills | Status: DC

## 2018-02-17 ENCOUNTER — Ambulatory Visit
Admission: RE | Admit: 2018-02-17 | Discharge: 2018-02-17 | Disposition: A | Discharge status: Home / Self Care | Payer: Medicare Other | Source: Ambulatory Visit | Attending: Specialist | Admitting: Specialist

## 2018-02-17 DIAGNOSIS — M4807 Spinal stenosis, lumbosacral region: Secondary | ICD-10-CM

## 2018-02-17 DIAGNOSIS — M5136 Other intervertebral disc degeneration, lumbar region: Secondary | ICD-10-CM

## 2018-03-01 ENCOUNTER — Telehealth (INDEPENDENT_AMBULATORY_CARE_PROVIDER_SITE_OTHER): Payer: Self-pay | Admitting: Specialist

## 2018-03-01 NOTE — Telephone Encounter (Signed)
I called and advised patient that on the appt set for 03/10/18 @ 1045 we would review her MRI and figure out a plan for her at that time.  She states that she understands

## 2018-03-09 ENCOUNTER — Encounter: Payer: Self-pay | Admitting: Internal Medicine

## 2018-03-10 ENCOUNTER — Ambulatory Visit (INDEPENDENT_AMBULATORY_CARE_PROVIDER_SITE_OTHER): Payer: Medicare Other | Admitting: Specialist

## 2018-03-10 ENCOUNTER — Encounter (INDEPENDENT_AMBULATORY_CARE_PROVIDER_SITE_OTHER): Payer: Self-pay | Admitting: Specialist

## 2018-03-10 VITALS — BP 146/103 | HR 80 | Ht 67.0 in | Wt 217.0 lb

## 2018-03-10 DIAGNOSIS — M48062 Spinal stenosis, lumbar region with neurogenic claudication: Secondary | ICD-10-CM | POA: Diagnosis not present

## 2018-03-10 DIAGNOSIS — R000 Tachycardia, unspecified: Secondary | ICD-10-CM

## 2018-03-10 DIAGNOSIS — M4316 Spondylolisthesis, lumbar region: Secondary | ICD-10-CM | POA: Diagnosis not present

## 2018-03-10 MED ORDER — GABAPENTIN 300 MG PO CAPS: 300.0000 mg | ORAL_CAPSULE | Freq: Three times a day (TID) | ORAL | 3 refills | Status: DC

## 2018-03-10 MED ORDER — HYDROCODONE-ACETAMINOPHEN 5-325 MG PO TABS: 1.0000 | ORAL_TABLET | Freq: Four times a day (QID) | ORAL | 0 refills | Status: DC | PRN

## 2018-03-10 NOTE — Patient Instructions (Addendum)
Avoid bending, stooping and avoid lifting weights greater than 10 lbs. Avoid prolong standing and walking. Order for a new walker with wheels. Surgery that can be considered is a decompressive laminectomy at L3-4 and L4-5 with one level lumbar fusion L4-5  this would be done with rods, screws and cages with local bone graft and allograft (donor bone graft). However we have not completed attempts at conservative management.  I recommend a right L4-5 transforamenal ESI and a course of physical therapy.  The previous injections by Dr. Alvester Morin were directed at the joints associated with the spondylolisthesis at L4-5, this injection will be directed at relieving the pain associated with nerve compression at the L4-5 level and is different, expect improved right leg pain and improvement in the walking pain .  Handicap license is approved. Dr. Wilkinson Blas secretary/Assistant will call to arrange for epidural steroid injection

## 2018-03-10 NOTE — Progress Notes (Signed)
Office Visit Note   Patient: Michelle Graves           Date of Birth: 1951-03-15           MRN: [id number] Visit Date: 03/10/2018              Requested by: [facility] [address] [place], [place] [address] PCP: [facility]   Assessment & Plan: Visit Diagnoses:  1. Spinal stenosis of lumbar region with neurogenic claudication   2. Spondylolisthesis at L4-L5 level     Plan: Avoid bending, stooping and avoid lifting weights greater than 10 lbs. Avoid prolong standing and walking. Order for a new walker with wheels. Surgery that can be considered is a decompressive laminectomy at L3-4 and L4-5 with one level lumbar fusion L4-5  this would be done with rods, screws and cages with local bone graft and allograft (donor bone graft). However we have not completed attempts at conservative management.  I recommend a right L4-5 transforamenal ESI and a course of physical therapy.  The previous injections by Dr. Alvester Morin were directed at the joints associated with the spondylolisthesis at L4-5, this injection will be directed at relieving the pain associated with nerve compression at the L4-5 level and is different, expect improved right leg pain and improvement in the walking pain .  Handicap license is approved. Dr. Stinson Beach Blas secretary/Assistant will call to arrange for epidural steroid injection    Follow-Up Instructions: No follow-ups on file.   Orders:  No orders of the defined types were placed in this encounter.  Meds ordered this encounter  Medications  . HYDROcodone-acetaminophen (NORCO/VICODIN) 5-325 MG tablet    Sig: Take 1-2 tablets by mouth every 6 (six) hours as needed for moderate pain.    Dispense:  40 tablet    Refill:  0  . gabapentin (NEURONTIN) 300 MG capsule    Sig: Take 1 capsule (300 mg total) by mouth 3 (three) times daily.    Dispense:  90 capsule    Refill:  3      Procedures: No procedures  performed   Clinical Data: Findings:  MRI finding show spondylolisthesis L4-5 with bilateral moderately severe foramenal stenosis and subarticular lateral recess stenosis, There is mild lateral recess stenosis bilaterally at the L3-4 level. Severe facet arthrosis changes with anterior facet subluxation L4-5 and moderate facet arthrosis L3-4. Severe DDD L4-5 , minimal L3-4.     Subjective: Chief Complaint  Patient presents with  . Lower Back - Follow-up, Pain  . Results    MRI    67 year old female with history of low back pain with radiation into the legs present with standing and walking and improves with sitting. Also pain with sitting for prolong periods and with repetitive bending and stooping. She undergone attempts at facet blocks at L4-5 to relieve her pain but thus far these have been without improvement. She underwent a lumbar MRI and presents today for review of the study and evaluation for different management.   Review of Systems  Constitutional: Negative.   HENT: Negative.   Eyes: Negative.   Respiratory: Negative.   Cardiovascular: Negative.   Gastrointestinal: Negative.   Endocrine: Negative.   Genitourinary: Negative.   Musculoskeletal: Negative.   Skin: Negative.   Allergic/Immunologic: Negative.   Neurological: Negative.   Hematological: Negative.   Psychiatric/Behavioral: Negative.      Objective: Vital Signs: BP (!) 146/103 (BP Location: Left Arm, Patient  Position: Sitting, Cuff Size: Normal)   Pulse 80   Ht 5\' 7"  (1.702 m)   Wt 217 lb (98.4 kg)   BMI 33.99 kg/m   Physical Exam  Constitutional: She is oriented to person, place, and time. She appears well-developed and well-nourished.  HENT:  Head: Normocephalic and atraumatic.  Eyes: Pupils are equal, round, and reactive to light. EOM are normal.  Neck: Normal range of motion. Neck supple.  Pulmonary/Chest: Effort normal and breath sounds normal.  Abdominal: Soft. Bowel sounds are normal.    Neurological: She is alert and oriented to person, place, and time.  Skin: Skin is warm and dry.  Psychiatric: She has a normal mood and affect. Her behavior is normal. Judgment and thought content normal.    Back Exam   Tenderness  The patient is experiencing tenderness in the lumbar.  Range of Motion  Extension: abnormal  Flexion: abnormal  Lateral bend right: abnormal  Lateral bend left: abnormal  Rotation right: abnormal  Rotation left: abnormal   Muscle Strength  Right Quadriceps:  5/5  Left Quadriceps:  5/5  Right Hamstrings:  5/5  Left Hamstrings:  5/5   Tests  Straight leg raise right: negative Straight leg raise left: negative  Reflexes  Patellar: normal Achilles: normal Babinski's sign: normal   Other  Toe walk: normal Heel walk: normal Sensation: normal Erythema: no back redness Scars: absent  Comments:  No focal motor deficit. She walk with stooped appearance, no limp list or obliquity of the pelvis.       Specialty Comments:  No specialty comments available.  Imaging: No results found.   PMFS History: Patient Active Problem List   Diagnosis Date Noted  . Diverticulitis of intestine without perforation or abscess without bleeding   . Sinus tachycardia 08/27/2016  . Diverticulitis 08/26/2016  . HYPERTENSION, MALIGNANT ESSENTIAL 06/24/2007  . KIDNEY DISEASE, CHRONIC, STAGE III 06/24/2007  . ARTHRITIS, RHEUMATOID, SEROPOSITIVE 06/24/2007   Past Medical History:  Diagnosis Date  . Arthritis   . Chest pain   . Diverticulitis   . Hypertension     History reviewed. No pertinent family history.  Past Surgical History:  Procedure Laterality Date  . TONSILLECTOMY     Social History   Occupational History  . Not on file  Tobacco Use  . Smoking status: Never Smoker  . Smokeless tobacco: Never Used  Substance and Sexual Activity  . Alcohol use: Yes    Alcohol/week: 0.6 oz    Types: 1 Shots of liquor per week    Comment: sometimes    . Drug use: No  . Sexual activity: Yes    Partners: Male    Birth control/protection: Condom

## 2018-03-11 ENCOUNTER — Telehealth (INDEPENDENT_AMBULATORY_CARE_PROVIDER_SITE_OTHER): Payer: Self-pay | Admitting: Radiology

## 2018-03-11 IMAGING — CT CT ABD-PELV W/O CM
2 of 4 series · 9 of 46 positions shown, 10 images · non-contrast
Comparison: 05/21/2005 CT abdomen pelvis.

CLINICAL DATA: 65 y/o F; 3 days of left lower quadrant pain with
nausea and vomiting.

EXAM:
CT ABDOMEN AND PELVIS WITHOUT CONTRAST
TECHNIQUE: Multidetector CT imaging of the abdomen and pelvis was performed
following the standard protocol without IV contrast.

[Series 201: routine, idose (2) · axial · 0.85mm/px · z∈[-727,-307]mm · 6 of 102 slices shown, 7 images]
[im 9/102  soft-tissue]
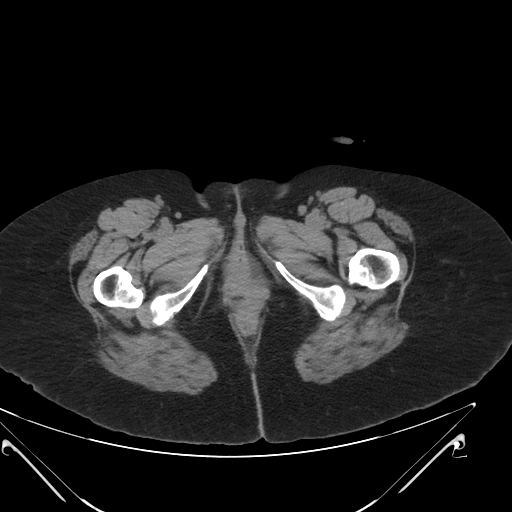
[im 9/102  bone]
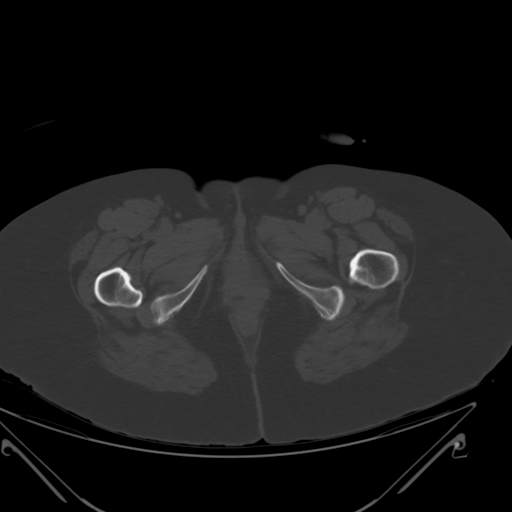
[im 26/102  soft-tissue]
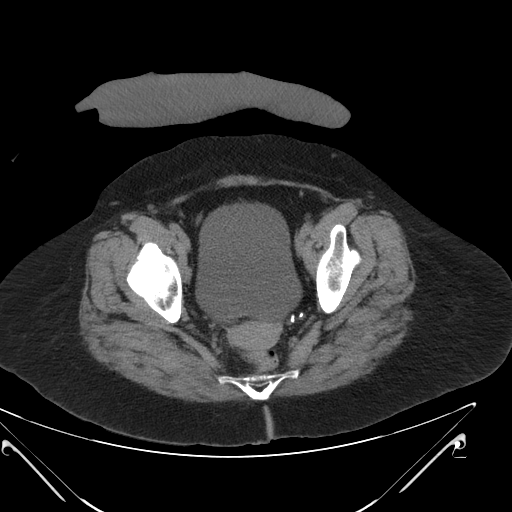
[im 43/102  soft-tissue]
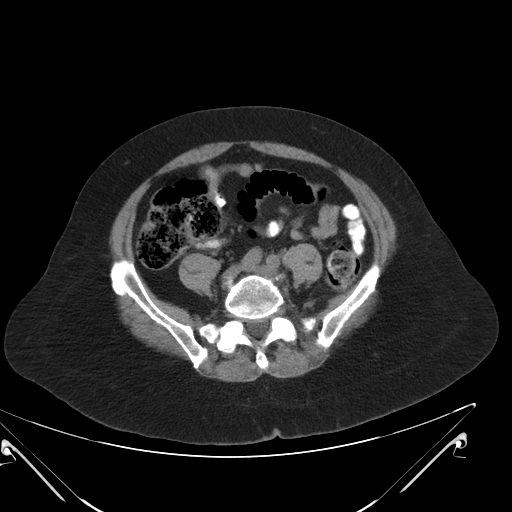
[im 59/102  soft-tissue]
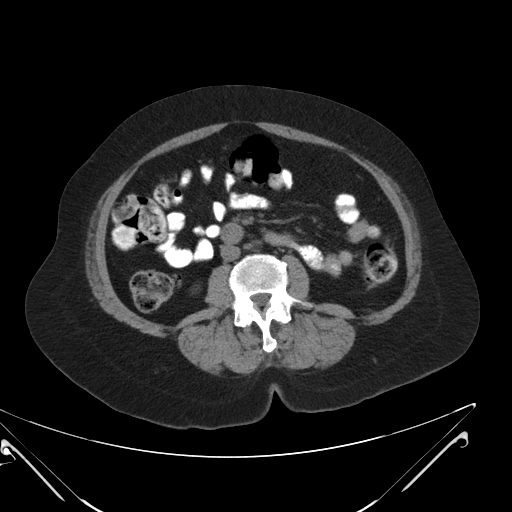
[im 76/102  soft-tissue]
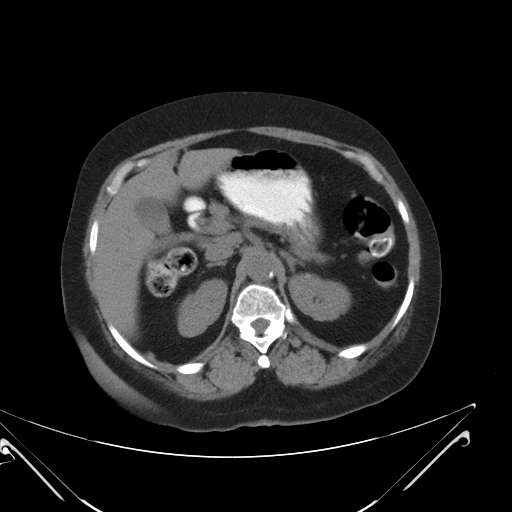
[im 93/102  soft-tissue]
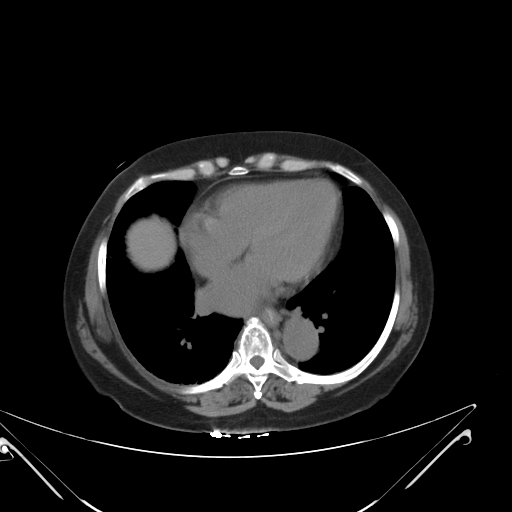

[Series 203: coronals, idose (2) · coronal · 0.45mm/px · 3 of 139 slices shown]
[im 47/139  soft-tissue]
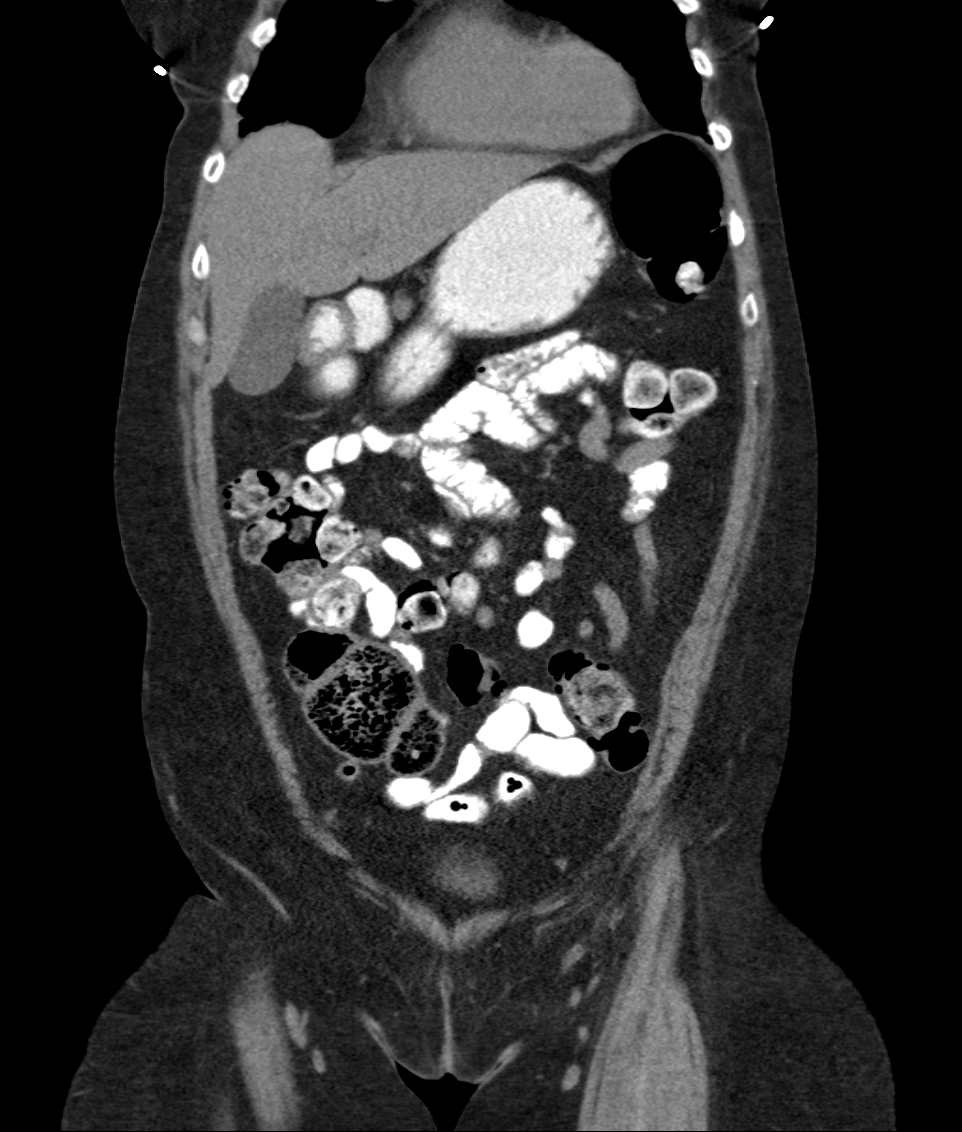
[im 62/139  soft-tissue]
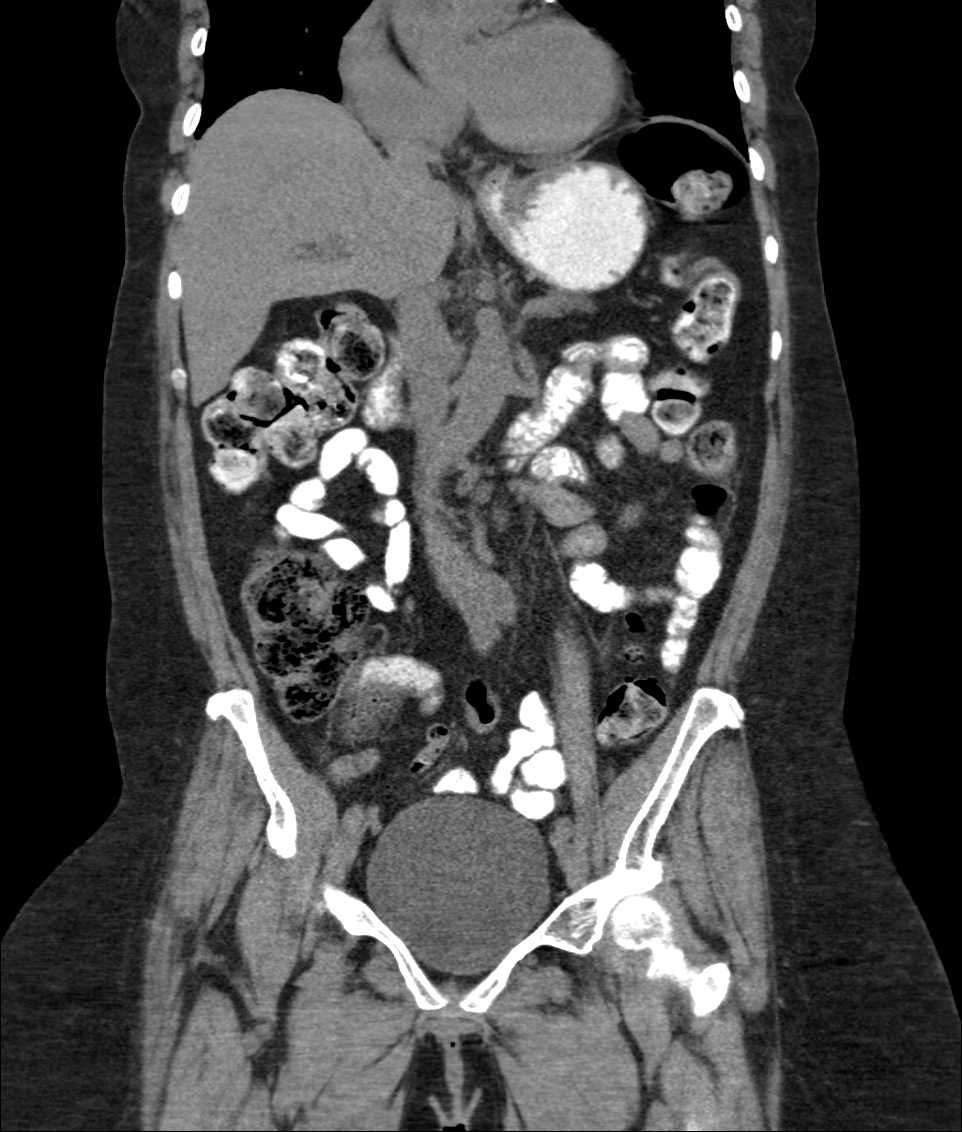
[im 77/139  soft-tissue]
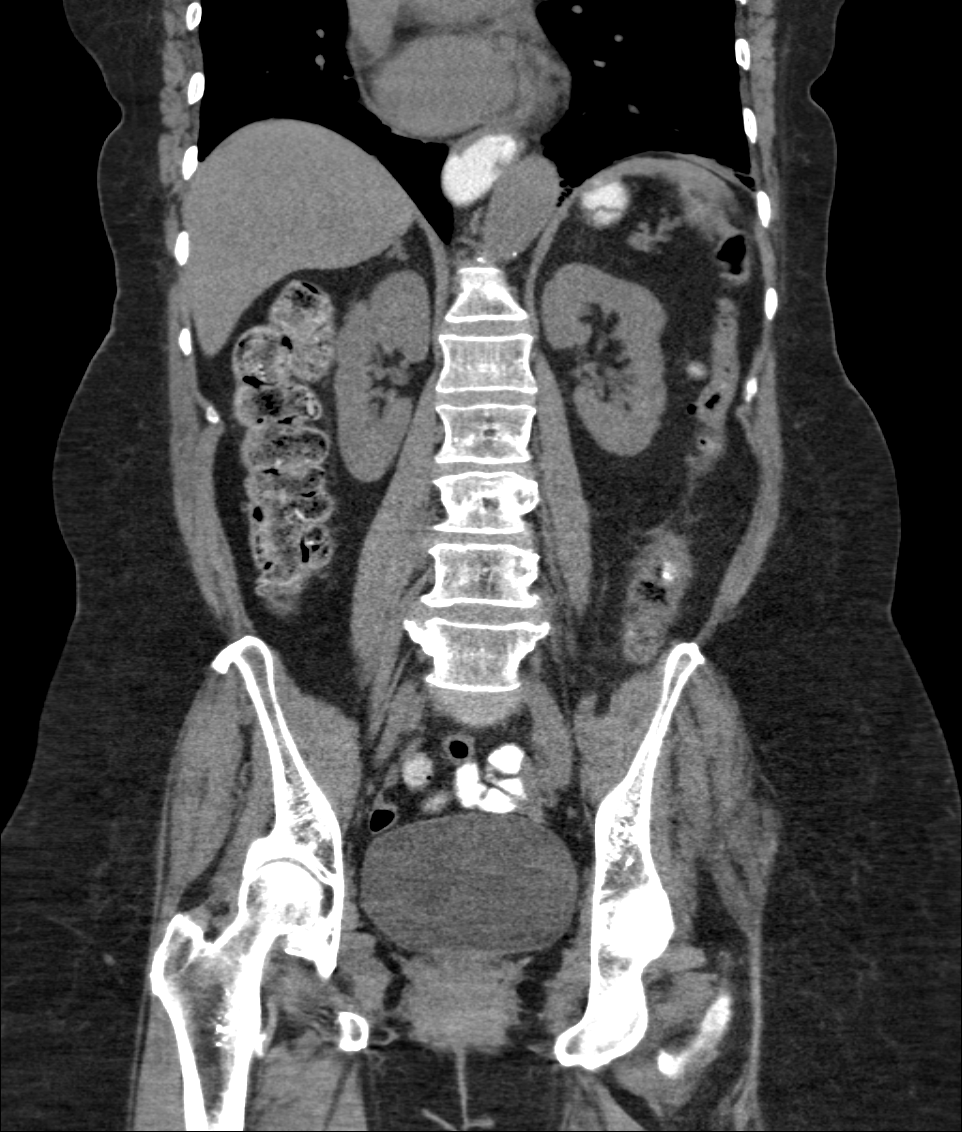

[9 of 46 positions shown; findings below may reference images not displayed]

FINDINGS: Lower chest: Right lung mild fibrotic changes and bronchiectasis.
Cluster of ground-glass opacities in the right middle lobe. Mild
cardiomegaly. Mild coronary artery calcifications.

Hepatobiliary: No focal liver abnormality is seen. No gallstones,
gallbladder wall thickening, or biliary dilatation. Focal fatty
infiltration along the falciform ligament.

Pancreas: Unremarkable. No pancreatic ductal dilatation or
surrounding inflammatory changes.

Spleen: Normal in size without focal abnormality.

Adrenals/Urinary Tract: Adrenal glands are unremarkable. Kidneys are
normal, without renal calculi, focal lesion, or hydronephrosis.
Bladder is unremarkable.

Stomach/Bowel: Moderate size hiatal hernia. Short segment of
descending colon in the left lower quadrant with wall thickening and
mild surrounding inflammatory changes (series 204, image 121). No
evidence for perforation or abscess. No evidence for bowel
obstruction. Normal appendix appear

Vascular/Lymphatic: Aortic atherosclerosis. No enlarged abdominal or
pelvic lymph nodes.

Reproductive: Uterus and bilateral adnexa are unremarkable.

Other: No abdominal wall hernia or abnormality. No abdominopelvic
ascites.

Musculoskeletal: No acute fracture identified. Degenerative grade 1
L4-5 anterolisthesis. Prominent lower lumbar facet arthrosis. Mild
osteoarthrosis of bilateral hip joints with acetabular fibrocystic
degeneration.
IMPRESSION: 1. Short segment of mild colitis of the descending colon in the left
lower quadrant. No evidence for perforation or abscess.
2. Right lung base fibrotic changes and bronchiectasis. Ground-glass
opacities in middle lobe probably represent an infectious or
inflammatory process.
3. Moderate hiatal hernia.

By: Lemlem Tariq M.D.

## 2018-03-11 NOTE — Telephone Encounter (Signed)
appt made for 04/21/18 @945 . (our first Available)

## 2018-03-11 NOTE — Telephone Encounter (Signed)
-----   Message from Kerrin Champagne, MD sent at 03/10/2018  6:47 PM EDT ----- Please have office schedule this patient for a return in 2 weeks after her ESI with Dr. Alvester Morin ordered, I listed in 3 weeks.

## 2018-03-27 IMAGING — CR DG CHEST 2V
2 series · 2 of 2 positions shown · non-contrast
Comparison: 02/21/1999 and

CLINICAL DATA: Right-sided abdominal pain emesis and diarrhea

EXAM:
CHEST  2 VIEW

[chest pa]
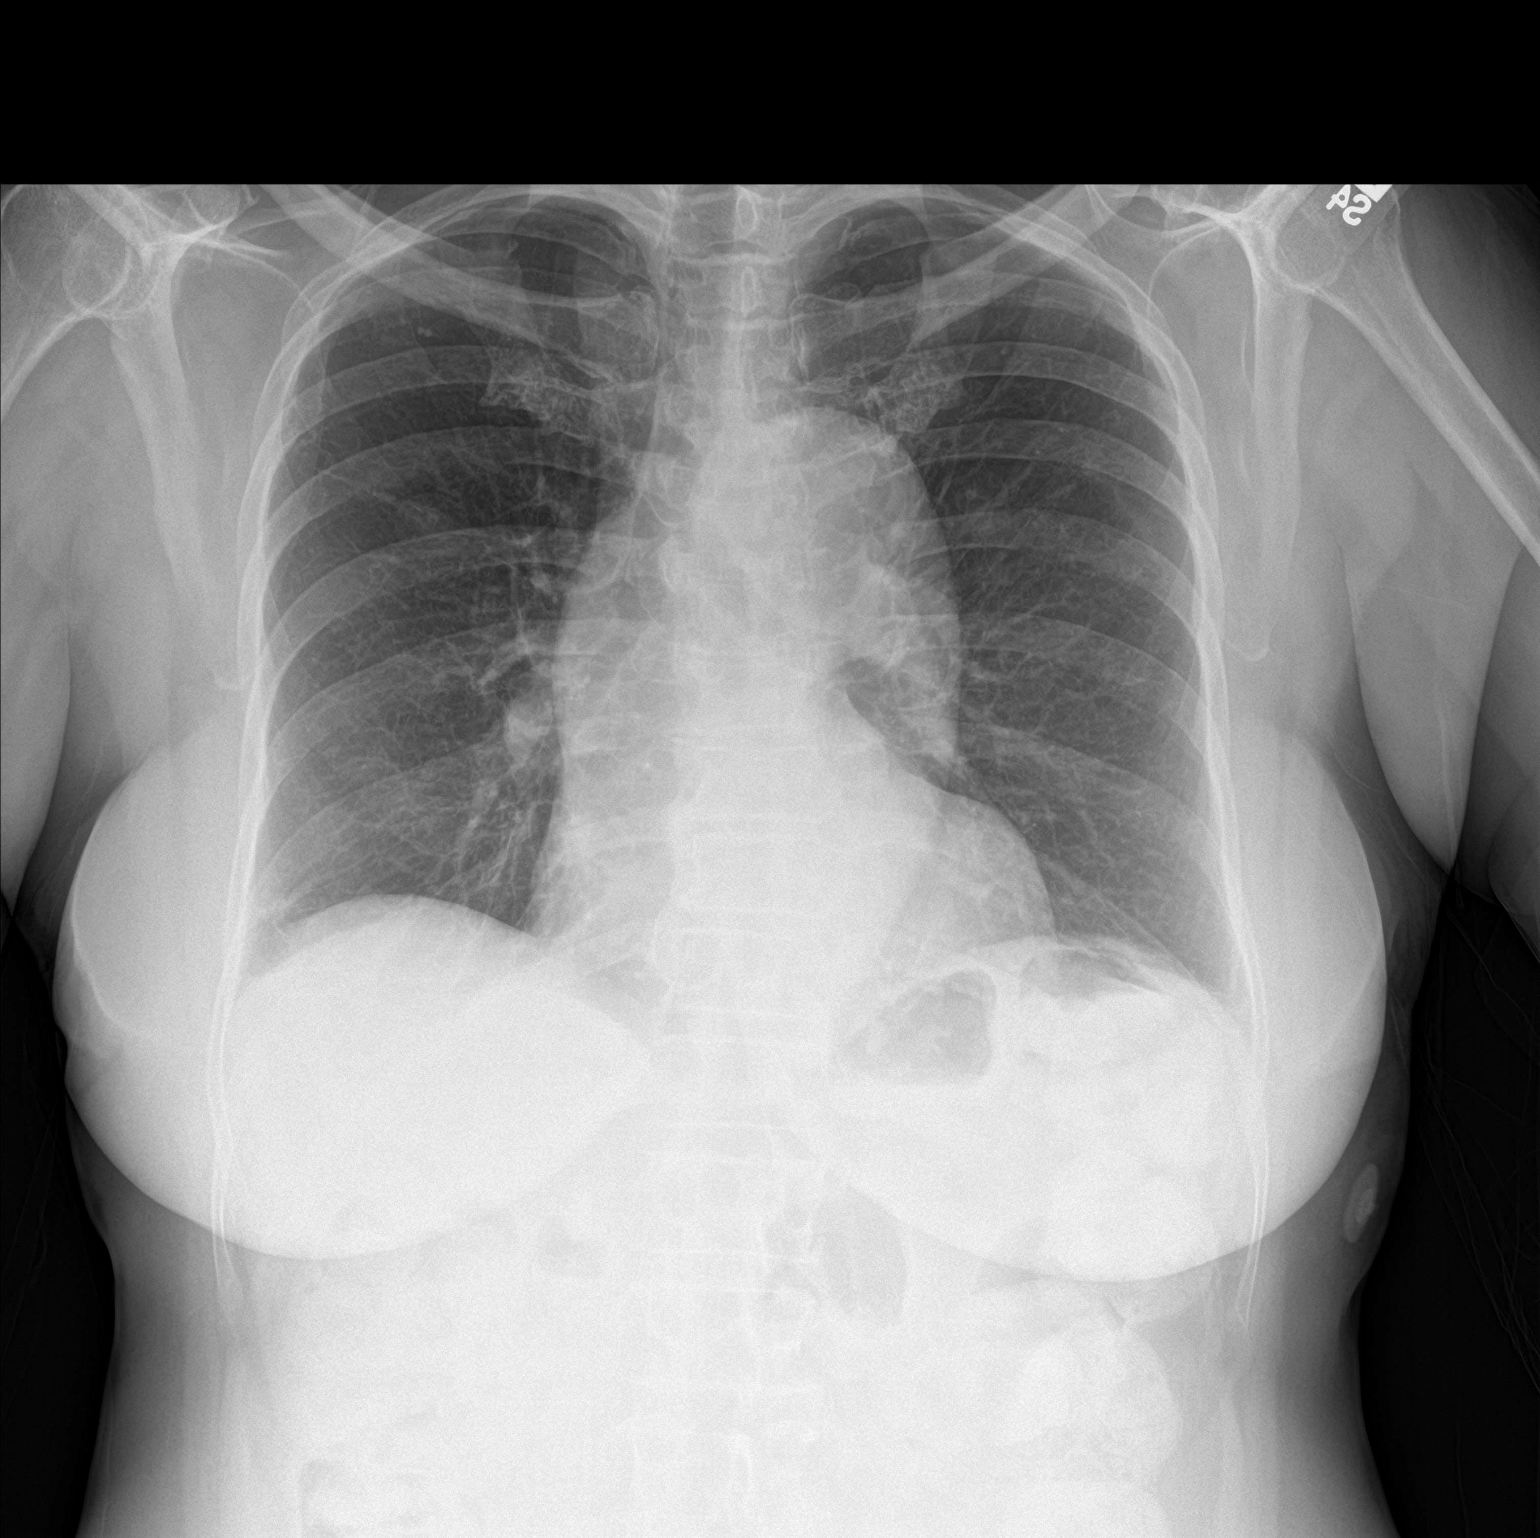

[chest lat]
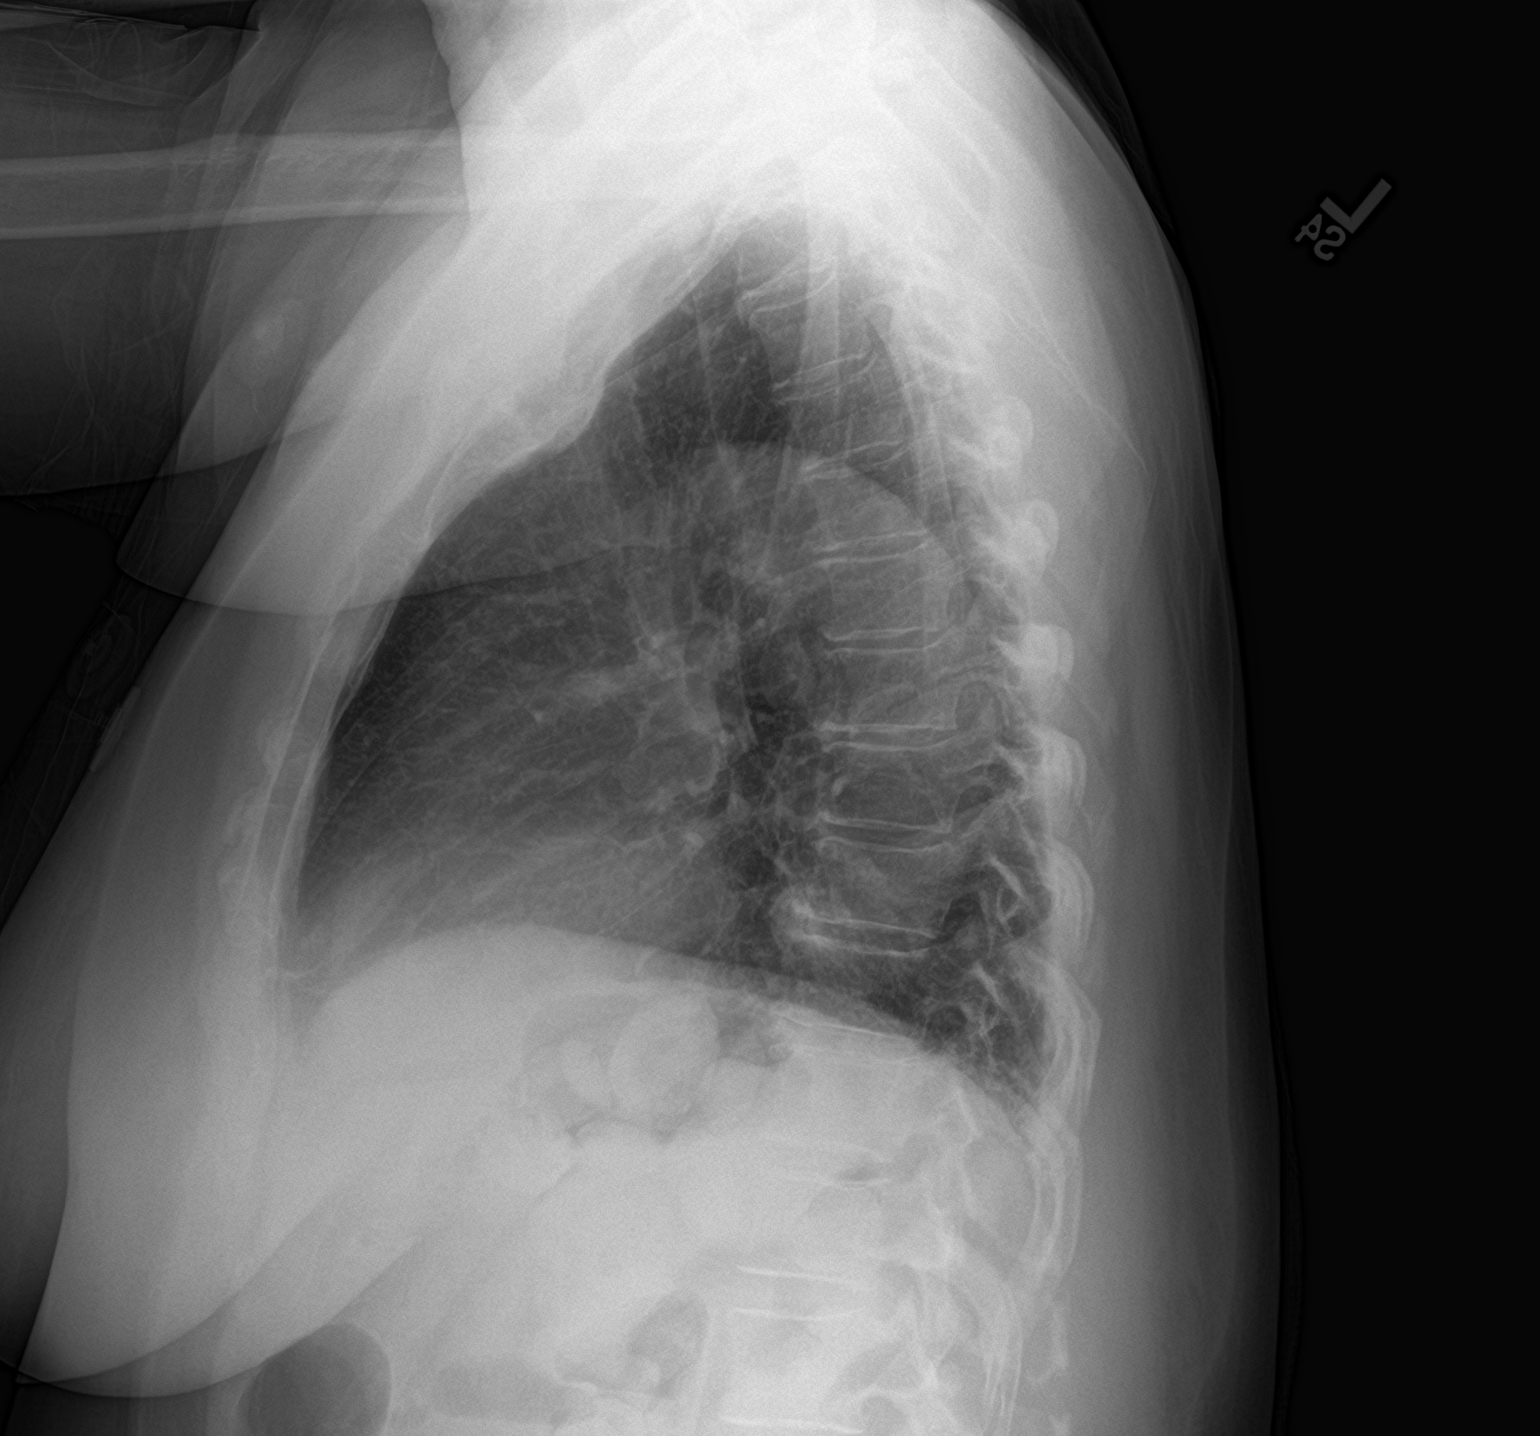

[2 of 2 positions shown; findings below may reference images not displayed]

FINDINGS: No acute infiltrate, consolidation or effusion. Small focus of
atelectasis near the right CP angle. Stable cardiomediastinal
silhouette with tortuous and unfolded aorta. Small calcified right
upper lobe lung nodules. No pneumothorax.
IMPRESSION: Small focus of right CP angle atelectasis. No acute infiltrate or
edema.

## 2018-03-27 IMAGING — CT CT ABD-PELV W/ CM
2 of 5 series · 15 of 46 positions shown, 17 images · IV contrast (Omni 300)
Comparison: 08/10/2016

CLINICAL DATA: Right lower quadrant pain for 2 days, history of
diverticulitis

EXAM:
CT ABDOMEN AND PELVIS WITH CONTRAST
TECHNIQUE: Multidetector CT imaging of the abdomen and pelvis was performed
using the standard protocol following bolus administration of
intravenous contrast.
CONTRAST:  75mL OQL0I2-1KK IOPAMIDOL (OQL0I2-1KK) INJECTION 61%

[Series 2: a/p w/ 5mm · axial · 0.67mm/px · z∈[+744,+1168]mm · 12 of 97 slices shown, 14 images]
[im 6/97  soft-tissue]
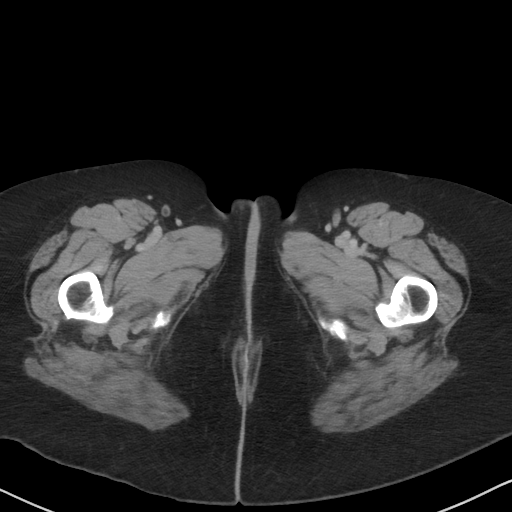
[im 6/97  bone]
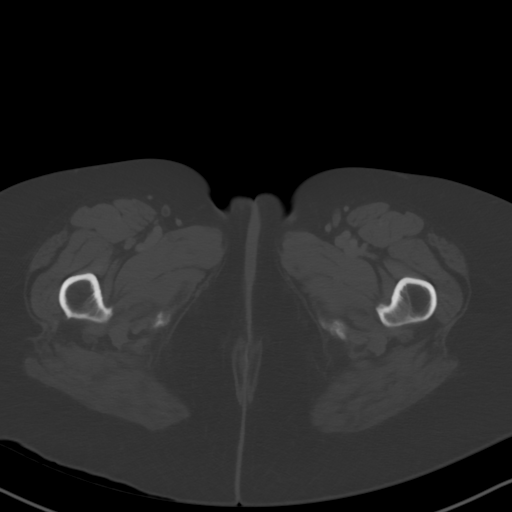
[im 17/97  soft-tissue]
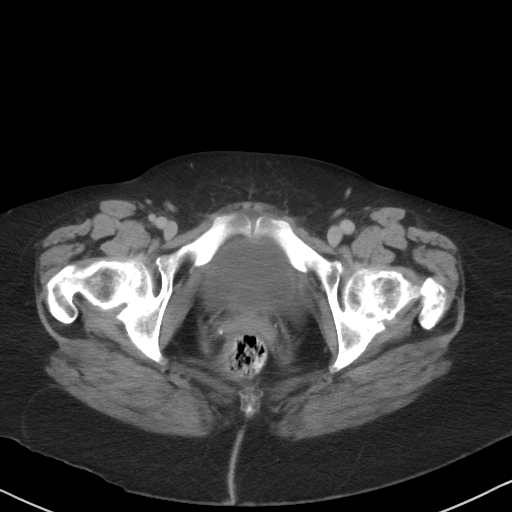
[im 22/97  soft-tissue]
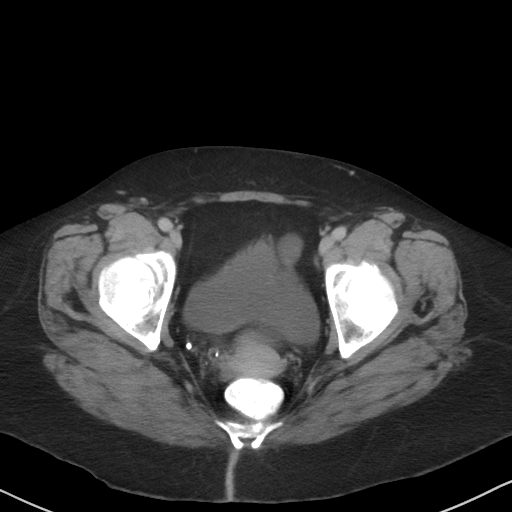
[im 27/97  soft-tissue]
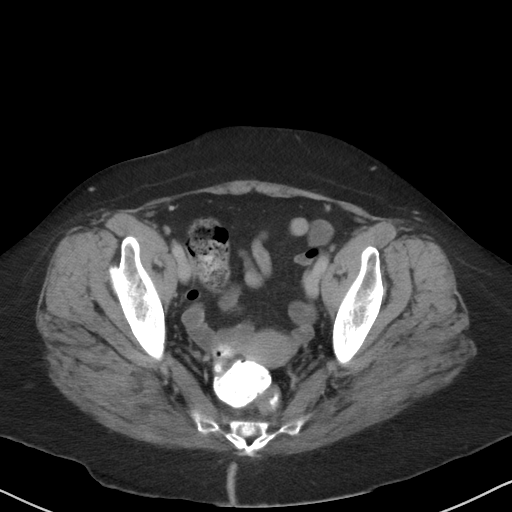
[im 38/97  soft-tissue]
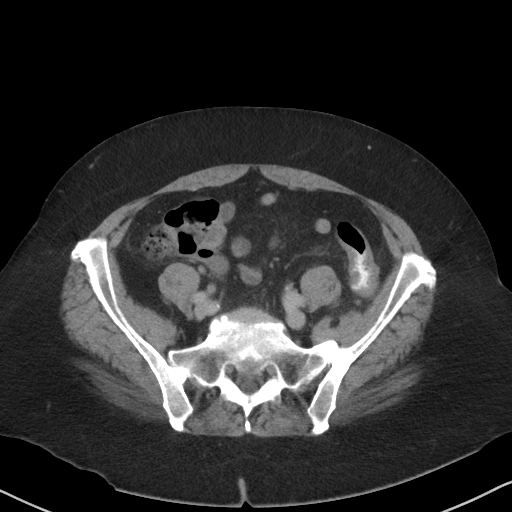
[im 43/97  soft-tissue]
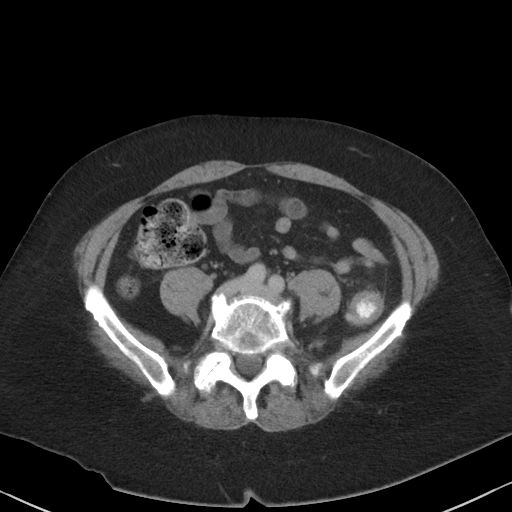
[im 54/97  soft-tissue]
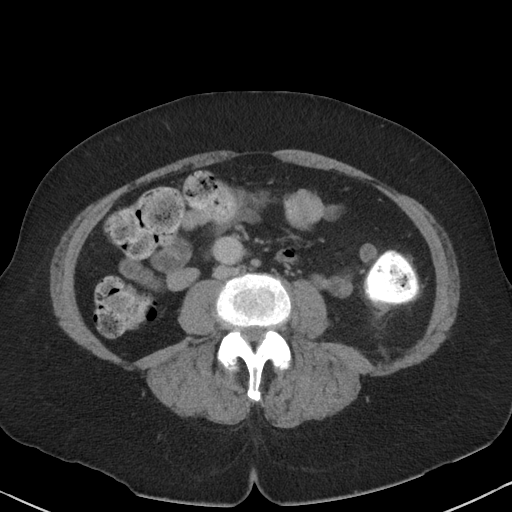
[im 59/97  soft-tissue]
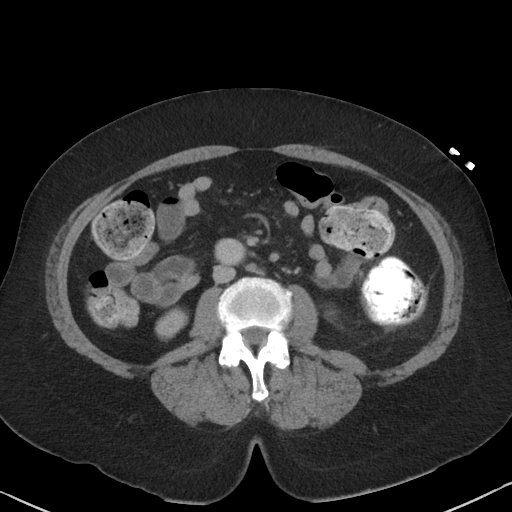
[im 70/97  soft-tissue]
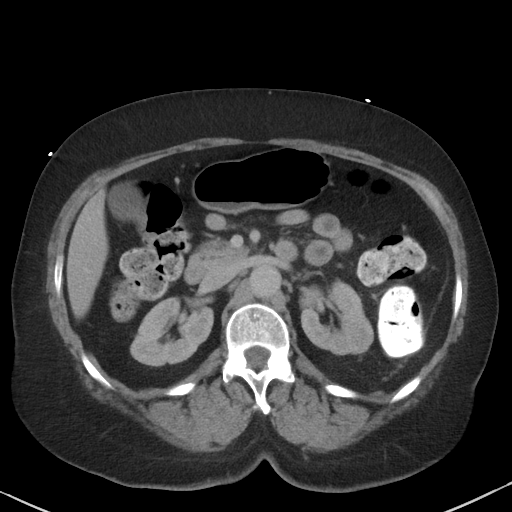
[im 70/97  bone]
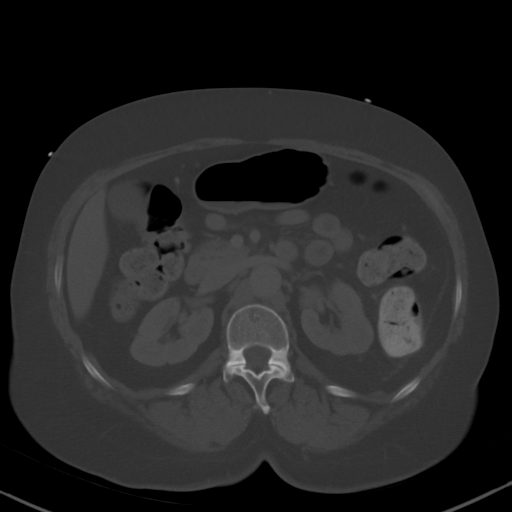
[im 75/97  soft-tissue]
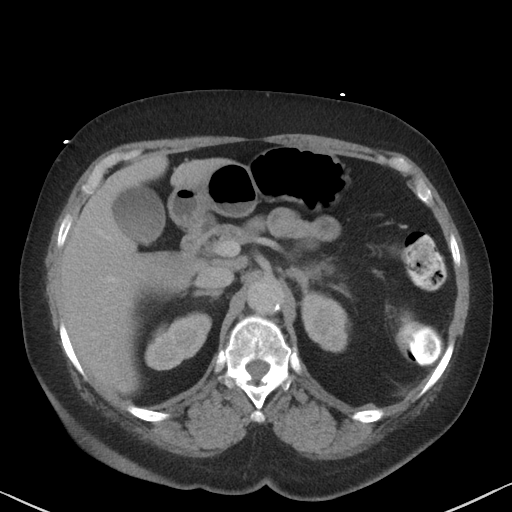
[im 81/97  soft-tissue]
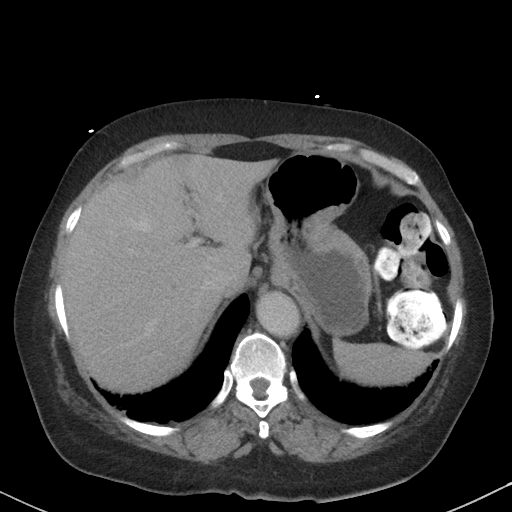
[im 91/97  soft-tissue]
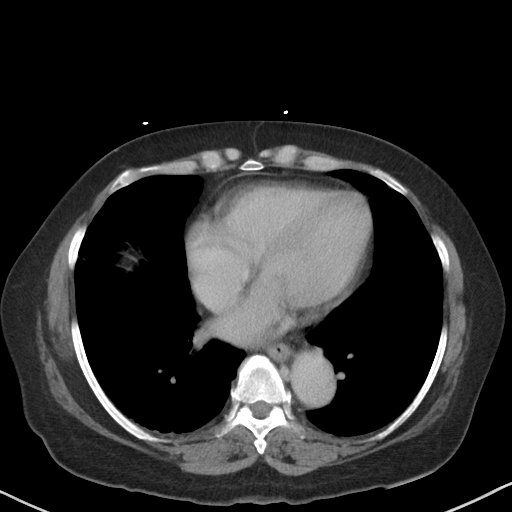

[Series 5: a/p w/ cor · coronal · 0.65mm/px · 3 of 124 slices shown]
[im 42/124  soft-tissue]
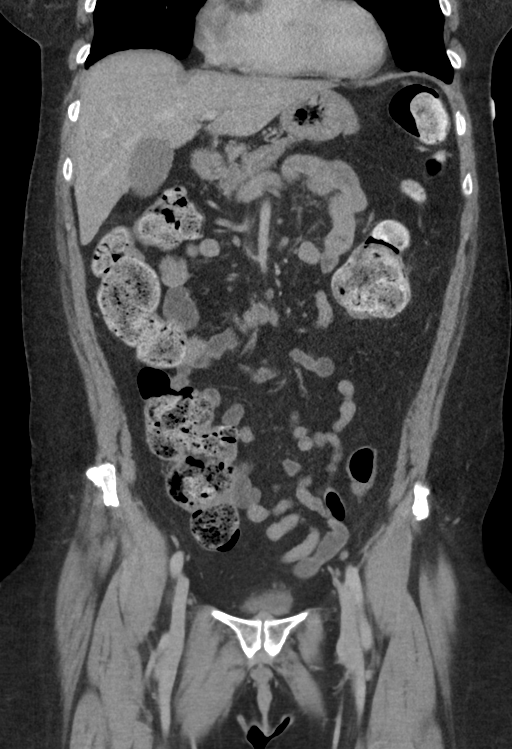
[im 55/124  soft-tissue]
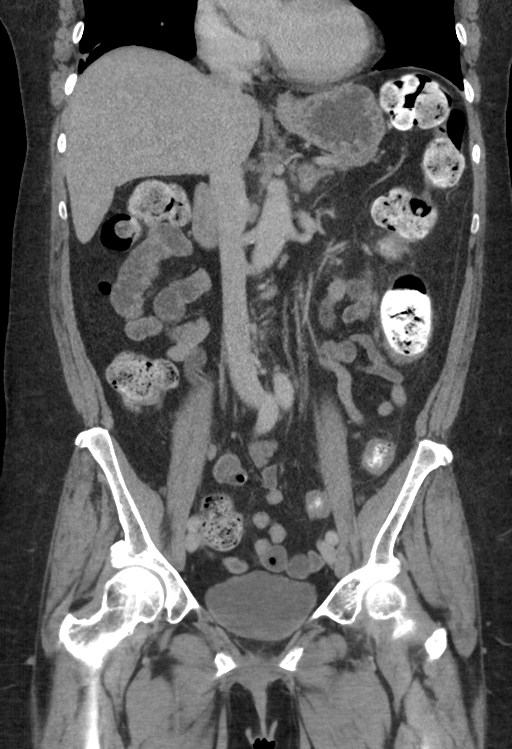
[im 69/124  soft-tissue]
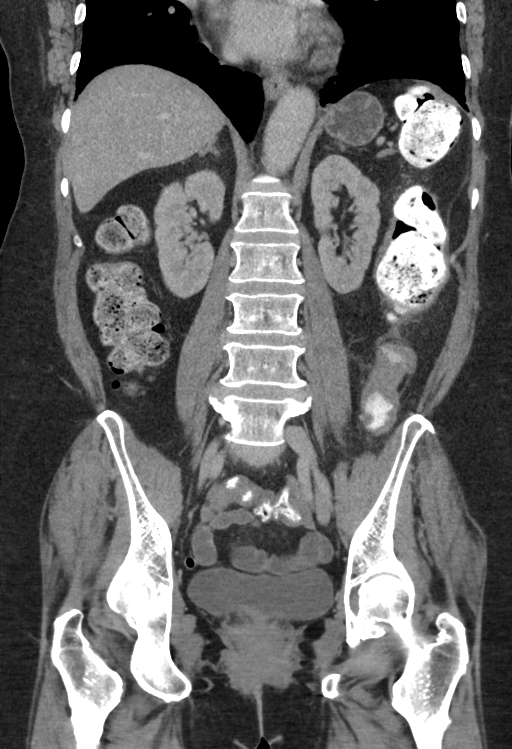

[15 of 46 positions shown; findings below may reference images not displayed]

FINDINGS: Lower chest: Chronic changes in the lung bases are noted. The
previously seen infiltrative change on the right has cleared.

Hepatobiliary: No focal liver abnormality is seen. No gallstones,
gallbladder wall thickening, or biliary dilatation.

Pancreas: Unremarkable. No pancreatic ductal dilatation or
surrounding inflammatory changes.

Spleen: Normal in size without focal abnormality.

Adrenals/Urinary Tract: Adrenal glands are unremarkable. Kidneys are
normal, without renal calculi, focal lesion, or hydronephrosis.
Bladder is unremarkable.

Stomach/Bowel: Stomach is within normal limits. Appendix appears
normal. There remains wall thickening and mild pericolonic
inflammatory change in the descending and proximal sigmoid colons
consistent with diverticulitis. The overall appearance is stable. No
perforation or abscess is seen.

Vascular/Lymphatic: No significant vascular findings are present. No
enlarged abdominal or pelvic lymph nodes.

Reproductive: Uterus and bilateral adnexa are unremarkable.

Other: No abdominal wall hernia or abnormality. No abdominopelvic
ascites.

Musculoskeletal: Degenerative changes of lumbar spine are seen.
IMPRESSION: Changes consistent with diverticulitis in the distal descending and
proximal sigmoid colon. No abscess or perforation is noted at this
time.

Normal-appearing appendix.

## 2018-04-01 ENCOUNTER — Encounter (INDEPENDENT_AMBULATORY_CARE_PROVIDER_SITE_OTHER): Payer: Self-pay | Admitting: Physical Medicine and Rehabilitation

## 2018-04-01 ENCOUNTER — Ambulatory Visit (INDEPENDENT_AMBULATORY_CARE_PROVIDER_SITE_OTHER): Payer: Medicare Other | Admitting: Physical Medicine and Rehabilitation

## 2018-04-01 ENCOUNTER — Ambulatory Visit (INDEPENDENT_AMBULATORY_CARE_PROVIDER_SITE_OTHER): Payer: Medicare Other

## 2018-04-01 VITALS — BP 126/91 | HR 79

## 2018-04-01 DIAGNOSIS — M5416 Radiculopathy, lumbar region: Secondary | ICD-10-CM

## 2018-04-01 MED ORDER — BETAMETHASONE SOD PHOS & ACET 6 (3-3) MG/ML IJ SUSP
12.0000 mg | Freq: Once | INTRAMUSCULAR | Status: AC
  Administered 2018-04-01: 12 mg

## 2018-04-01 NOTE — Patient Instructions (Signed)

## 2018-04-01 NOTE — Progress Notes (Signed)
Numeric Pain Rating Scale and Functional Assessment Average Pain 10   In the last MONTH (on 0-10 scale) has pain interfered with the following?  1. General activity like being  able to carry out your everyday physical activities such as walking, climbing stairs, carrying groceries, or moving a chair?  Rating(10)   +Driver, -BT, -Dye Allergies. 

## 2018-04-12 NOTE — Procedures (Signed)
Lumbosacral Transforaminal Epidural Steroid Injection - Sub-Pedicular Approach with Fluoroscopic Guidance  Patient: Michelle Graves      Date of Birth: [birth year] MRN: [id number] PCP: [facility] [place]      Visit Date: 04/01/2018   Universal Protocol:    Date/Time: 04/01/2018  Consent Given By: the patient  Position: PRONE  Additional Comments: Vital signs were monitored before and after the procedure. Patient was prepped and draped in the usual sterile fashion. The correct patient, procedure, and site was verified.   Injection Procedure Details:  Procedure Site One Meds Administered:  Meds ordered this encounter  Medications  . betamethasone acetate-betamethasone sodium phosphate (CELESTONE) injection 12 mg    Laterality: Bilateral  Location/Site:  L4-L5  Needle size: 22 G  Needle type: Spinal  Needle Placement: Transforaminal  Findings:    -Comments: Excellent flow of contrast along the nerve and into the epidural space.  Procedure Details: After squaring off the end-plates to get a true AP view, the C-arm was positioned so that an oblique view of the foramen as noted above was visualized. The target area is just inferior to the "nose of the scotty dog" or sub pedicular. The soft tissues overlying this structure were infiltrated with 2-3 ml. of 1% Lidocaine without Epinephrine.  The spinal needle was inserted toward the target using a "trajectory" view along the fluoroscope beam.  Under AP and lateral visualization, the needle was advanced so it did not puncture dura and was located close the 6 O'Clock position of the pedical in AP tracterory. Biplanar projections were used to confirm position. Aspiration was confirmed to be negative for CSF and/or blood. A 1-2 ml. volume of Isovue-250 was injected and flow of contrast was noted at each level. Radiographs were obtained for documentation purposes.   After attaining the desired flow of contrast  documented above, a 0.5 to 1.0 ml test dose of 0.25% Marcaine was injected into each respective transforaminal space.  The patient was observed for 90 seconds post injection.  After no sensory deficits were reported, and normal lower extremity motor function was noted,   the above injectate was administered so that equal amounts of the injectate were placed at each foramen (level) into the transforaminal epidural space.   Additional Comments:  The patient tolerated the procedure well Dressing: Band-Aid    Post-procedure details: Patient was observed during the procedure. Post-procedure instructions were reviewed.  Patient left the clinic in stable condition.

## 2018-04-12 NOTE — Progress Notes (Signed)
Michelle Graves - 67 y.o. female MRN [id number]  Date of birth: 1951/10/05  Office Visit Note: Visit Date: 04/01/2018 PCP: [facility] [place] Referred by: [facility] Judie Petit*  Subjective: Chief Complaint  Patient presents with  . Lower Back - Pain  . Right Leg - Pain  . Left Leg - Pain   HPI: Michelle Graves is a 67 year old female who comes in today at the request of Dr. Otelia Sergeant for right L4 transforaminal epidural steroid injection.  However, the patient is having low back and bilateral radicular leg pain.  She has had updated MRI showing significant stenosis at L4-5.  We are going to complete diagnostic and hopefully therapeutic bilateral L4 transforaminal injections bilaterally.  Please see Dr. Barbaraann Faster notes for further justification and details.  Prior facet joint blocks were not beneficial.  These were completed before the MRI.   ROS Otherwise per HPI.  Assessment & Plan: Visit Diagnoses:  1. Lumbar radiculopathy     Plan: No additional findings.   Meds & Orders:  Meds ordered this encounter  Medications  . betamethasone acetate-betamethasone sodium phosphate (CELESTONE) injection 12 mg    Orders Placed This Encounter  Procedures  . XR C-ARM NO REPORT  . Epidural Steroid injection    Follow-up: Return for Dr. Otelia Sergeant.   Procedures: No procedures performed  Lumbosacral Transforaminal Epidural Steroid Injection - Sub-Pedicular Approach with Fluoroscopic Guidance  Patient: Michelle Graves      Date of Birth: [birth year] MRN: [id number] PCP: [facility] [place]      Visit Date: 04/01/2018   Universal Protocol:    Date/Time: 04/01/2018  Consent Given By: the patient  Position: PRONE  Additional Comments: Vital signs were monitored before and after the procedure. Patient was prepped and draped in the usual sterile fashion. The correct patient, procedure, and site was verified.   Injection Procedure Details:    Procedure Site One Meds Administered:  Meds ordered this encounter  Medications  . betamethasone acetate-betamethasone sodium phosphate (CELESTONE) injection 12 mg    Laterality: Bilateral  Location/Site:  L4-L5  Needle size: 22 G  Needle type: Spinal  Needle Placement: Transforaminal  Findings:    -Comments: Excellent flow of contrast along the nerve and into the epidural space.  Procedure Details: After squaring off the end-plates to get a true AP view, the C-arm was positioned so that an oblique view of the foramen as noted above was visualized. The target area is just inferior to the "nose of the scotty dog" or sub pedicular. The soft tissues overlying this structure were infiltrated with 2-3 ml. of 1% Lidocaine without Epinephrine.  The spinal needle was inserted toward the target using a "trajectory" view along the fluoroscope beam.  Under AP and lateral visualization, the needle was advanced so it did not puncture dura and was located close the 6 O'Clock position of the pedical in AP tracterory. Biplanar projections were used to confirm position. Aspiration was confirmed to be negative for CSF and/or blood. A 1-2 ml. volume of Isovue-250 was injected and flow of contrast was noted at each level. Radiographs were obtained for documentation purposes.   After attaining the desired flow of contrast documented above, a 0.5 to 1.0 ml test dose of 0.25% Marcaine was injected into each respective transforaminal space.  The patient was observed for 90 seconds post injection.  After no sensory deficits were reported, and normal lower extremity motor function was noted,   the above  injectate was administered so that equal amounts of the injectate were placed at each foramen (level) into the transforaminal epidural space.   Additional Comments:  The patient tolerated the procedure well Dressing: Band-Aid    Post-procedure details: Patient was observed during the  procedure. Post-procedure instructions were reviewed.  Patient left the clinic in stable condition.    Clinical History: MRI LUMBAR SPINE WITHOUT CONTRAST  TECHNIQUE: Multiplanar, multisequence MR imaging of the lumbar spine was performed. No intravenous contrast was administered.  COMPARISON:  Plain films lumbar spine 11/30/2017.  FINDINGS: Segmentation:  Standard.  Alignment: There is facet mediated 0.5 cm anterolisthesis L4 on L5. Alignment is otherwise maintained.  Vertebrae: No fracture or worrisome lesion. Hemangioma in T11 incidentally noted.  Conus medullaris and cauda equina: Conus extends to the L1. level. Conus and cauda equina appear normal.  Paraspinal and other soft tissues: No acute abnormality.  Disc levels:  T10-11 is imaged in the sagittal plane only and negative.  T11-12: Negative.  T12-L1: Negative.  L1-2: Mild facet degenerative change.  Otherwise negative.  L2-3: Mild-to-moderate facet arthropathy.  Otherwise negative.  L3-4: Moderate bilateral facet degenerative change and a shallow disc bulge. There is mild to moderate central canal stenosis. The foramina are open.  L4-5: Advanced bilateral facet degenerative change is present. The disc is uncovered and there is some ligamentum flavum thickening. Moderate to moderately severe central canal stenosis is present with narrowing of both subarticular recesses. Severe left and moderate right foraminal narrowing is present.  L5-S1: Bilateral facet arthropathy is worse on the right. There is a shallow central protrusion without central canal stenosis. The foramina are open.  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 Severe left and moderate right foraminal narrowing is also present this level. Facet arthropathy results in 0.5  cm anterolisthesis at this level.   Electronically Signed   By: Drusilla Kanner M.D.   On: 02/18/2018 09:04   She reports that she has never smoked. She has never used smokeless tobacco. No results for input(s): HGBA1C, LABURIC in the last 8760 hours.  Objective:  VS:  HT:    WT:   BMI:     BP:(!) 126/91  HR:79bpm  TEMP: ( )  RESP:  Physical Exam  Ortho Exam Imaging: No results found.  Past Medical/Family/Surgical/Social History: Medications & Allergies reviewed per EMR, new medications updated. Patient Active Problem List   Diagnosis Date Noted  . Diverticulitis of intestine without perforation or abscess without bleeding   . Sinus tachycardia 08/27/2016  . Diverticulitis 08/26/2016  . HYPERTENSION, MALIGNANT ESSENTIAL 06/24/2007  . KIDNEY DISEASE, CHRONIC, STAGE III 06/24/2007  . ARTHRITIS, RHEUMATOID, SEROPOSITIVE 06/24/2007   Past Medical History:  Diagnosis Date  . Arthritis   . Chest pain   . Diverticulitis   . Hypertension    History reviewed. No pertinent family history. Past Surgical History:  Procedure Laterality Date  . TONSILLECTOMY     Social History   Occupational History  . Not on file  Tobacco Use  . Smoking status: Never Smoker  . Smokeless tobacco: Never Used  Substance and Sexual Activity  . Alcohol use: Yes    Alcohol/week: 0.6 oz    Types: 1 Shots of liquor per week    Comment: sometimes   . Drug use: No  . Sexual activity: Yes    Partners: Male    Birth  control/protection: Condom

## 2018-04-14 ENCOUNTER — Telehealth (INDEPENDENT_AMBULATORY_CARE_PROVIDER_SITE_OTHER): Payer: Self-pay | Admitting: Specialist

## 2018-04-14 NOTE — Telephone Encounter (Signed)
Asher Muir from [facility] called wanting to let Dr. Otelia Sergeant know the clearance is taking a while because they have tried to contact patient for her to be seen and she hasn't responded. CB # [phone]

## 2018-04-15 NOTE — Telephone Encounter (Signed)
FYI-I called to advise patient of this and she states that they had not tried to reach her, as her phone is always with her.  I advised that as soon as she gets a chance to call them and schedule an appt so we can get the clearance so we can get her scheduled.  She states "OK".

## 2018-04-21 ENCOUNTER — Ambulatory Visit (INDEPENDENT_AMBULATORY_CARE_PROVIDER_SITE_OTHER): Payer: Medicare Other | Admitting: Specialist

## 2018-04-21 ENCOUNTER — Other Ambulatory Visit: Payer: Self-pay

## 2018-04-21 ENCOUNTER — Emergency Department (HOSPITAL_COMMUNITY): Payer: Medicare Other

## 2018-04-21 ENCOUNTER — Encounter (HOSPITAL_COMMUNITY): Payer: Self-pay

## 2018-04-21 ENCOUNTER — Emergency Department (HOSPITAL_COMMUNITY)
Admission: EM | Admit: 2018-04-21 | Discharge: 2018-04-22 | Discharge status: Against Medical Advice | Payer: Medicare Other | Attending: Emergency Medicine | Admitting: Emergency Medicine

## 2018-04-21 DIAGNOSIS — Z7982 Long term (current) use of aspirin: Secondary | ICD-10-CM | POA: Insufficient documentation

## 2018-04-21 DIAGNOSIS — R0602 Shortness of breath: Secondary | ICD-10-CM | POA: Diagnosis present

## 2018-04-21 DIAGNOSIS — N183 Chronic kidney disease, stage 3 (moderate): Secondary | ICD-10-CM | POA: Insufficient documentation

## 2018-04-21 DIAGNOSIS — Z79899 Other long term (current) drug therapy: Secondary | ICD-10-CM | POA: Diagnosis not present

## 2018-04-21 DIAGNOSIS — I129 Hypertensive chronic kidney disease with stage 1 through stage 4 chronic kidney disease, or unspecified chronic kidney disease: Secondary | ICD-10-CM | POA: Diagnosis not present

## 2018-04-21 LAB — I-STAT TROPONIN, ED: Troponin i, poc: 0.01 ng/mL (ref 0.00–0.08)

## 2018-04-21 LAB — CBC WITH DIFFERENTIAL/PLATELET
Abs Immature Granulocytes: 0 10*3/uL (ref 0.0–0.1)
Basophils Absolute: 0.1 10*3/uL (ref 0.0–0.1)
Basophils Relative: 1 %
Eosinophils Absolute: 0.3 10*3/uL (ref 0.0–0.7)
Eosinophils Relative: 5 %
HCT: 37.5 % (ref 36.0–46.0)
Hemoglobin: 11.7 g/dL — ABNORMAL LOW (ref 12.0–15.0)
Immature Granulocytes: 0 %
Lymphocytes Relative: 39 %
Lymphs Abs: 2 10*3/uL (ref 0.7–4.0)
MCH: 29.4 pg (ref 26.0–34.0)
MCHC: 31.2 g/dL (ref 30.0–36.0)
MCV: 94.2 fL (ref 78.0–100.0)
Monocytes Absolute: 0.5 10*3/uL (ref 0.1–1.0)
Monocytes Relative: 10 %
Neutro Abs: 2.2 10*3/uL (ref 1.7–7.7)
Neutrophils Relative %: 45 %
Platelets: 186 10*3/uL (ref 150–400)
RBC: 3.98 MIL/uL (ref 3.87–5.11)
RDW: 13.7 % (ref 11.5–15.5)
WBC: 5.1 10*3/uL (ref 4.0–10.5)

## 2018-04-21 LAB — BASIC METABOLIC PANEL
Anion gap: 9 (ref 5–15)
BUN: 18 mg/dL (ref 8–23)
CO2: 26 mmol/L (ref 22–32)
Calcium: 9.6 mg/dL (ref 8.9–10.3)
Chloride: 107 mmol/L (ref 98–111)
Creatinine, Ser: 1.58 mg/dL — ABNORMAL HIGH (ref 0.44–1.00)
GFR calc Af Amer: 39 mL/min — ABNORMAL LOW (ref >60)
GFR calc non Af Amer: 33 mL/min — ABNORMAL LOW (ref >60)
Glucose, Bld: 96 mg/dL (ref 70–99)
Potassium: 4.7 mmol/L (ref 3.5–5.1)
Sodium: 142 mmol/L (ref 135–145)

## 2018-04-21 LAB — BRAIN NATRIURETIC PEPTIDE: B Natriuretic Peptide: 108.2 pg/mL — ABNORMAL HIGH (ref 0.0–100.0)

## 2018-04-21 MED ORDER — FENTANYL CITRATE (PF) 100 MCG/2ML IJ SOLN
50.0000 ug | Freq: Once | INTRAMUSCULAR | Status: AC
  Administered 2018-04-21: 50 ug via INTRAVENOUS
  Filled 2018-04-21: qty 2

## 2018-04-21 NOTE — ED Triage Notes (Signed)
Pt reports SOB X3-4 days denies asthma, denies COPD, denies any cold symptoms. CP X3-4 days. Denies NV, denies diaphoresis.

## 2018-04-21 NOTE — ED Provider Notes (Signed)
[facility] EMERGENCY DEPARTMENT Provider Note   CSN: [id number] Arrival date & time: 04/21/18  1810     History   Chief Complaint Chief Complaint  Patient presents with  . Shortness of Breath    HPI Michelle Graves is a 67 y.o. female past medical history of arthritis, chest pain, diverticulitis, hypertension, who presents for evaluation of progressive worsening shortness of breath and chest pain.  She reports is been ongoing for approximately 1 week.  Patient also reports that she has been having bilateral lower extremity swelling.  Additionally, patient reports that she has pain "all over, in her hips and her length in her shoulders."  She denies any preceding trauma, injury, fall.  Patient states she has not taken anything for the pain.  Patient reports that for the last week, she has had a constant dull pressure in the left side of her chest.  Patient also reports that she has had shortness of breath.  She states that both the shortness of breath and chest pain are worse with exertion.  Patient reports that she has had swelling to bilateral lower extremities.  She states that there is been ongoing for last several months.  She reports that the pain just started few days ago.  No overlying warmth, erythema. She denies any OCP use, recent immobilization, prior history of DVT/PE, recent surgery, leg swelling, or long travel.  Patient denies any fevers, vision changes, numbness/weakness of her extremities.  She is not a current smoker.  She denies any personal cardiac history.  She denies any family cardiac history  The history is provided by the patient.    Past Medical History:  Diagnosis Date  . Arthritis   . Chest pain   . Diverticulitis   . Hypertension     Patient Active Problem List   Diagnosis Date Noted  . Diverticulitis of intestine without perforation or abscess without bleeding   . Sinus tachycardia 08/27/2016  . Diverticulitis 08/26/2016  .  HYPERTENSION, MALIGNANT ESSENTIAL 06/24/2007  . KIDNEY DISEASE, CHRONIC, STAGE III 06/24/2007  . ARTHRITIS, RHEUMATOID, SEROPOSITIVE 06/24/2007    Past Surgical History:  Procedure Laterality Date  . TONSILLECTOMY       OB History   None      Home Medications    Prior to Admission medications   Medication Sig Start Date End Date Taking? Authorizing Provider  amLODipine (NORVASC) 10 MG tablet Take 10 mg by mouth daily.   Yes [provider]  aspirin EC 81 MG tablet Take 81 mg by mouth daily.   Yes [provider]  calcium carbonate (OSCAL) 1500 (600 Ca) MG TABS tablet Take 600 mg of elemental calcium by mouth daily.   Yes [provider]  diclofenac (VOLTAREN) 75 MG EC tablet Take 75 mg by mouth daily. 03/02/18  Yes [provider]  gabapentin (NEURONTIN) 300 MG capsule Take 1 capsule (300 mg total) by mouth 3 (three) times daily. 03/10/18  Yes Kerrin Champagne, MD  hydroxychloroquine (PLAQUENIL) 200 MG tablet Take 200 mg by mouth 2 (two) times daily.  06/16/16  Yes [provider]  lisinopril (PRINIVIL,ZESTRIL) 20 MG tablet Take 20 mg by mouth daily.   Yes [provider]  metoprolol tartrate (LOPRESSOR) 100 MG tablet Take 100 mg by mouth 2 (two) times daily.  11/19/15  Yes [provider]  Misc Natural Products (OSTEO BI-FLEX JOINT SHIELD PO) Take 1 tablet by mouth daily.   Yes [provider]  Multiple Vitamins-Minerals (CENTRUM SILVER 50+WOMEN PO) Take 1 tablet by mouth daily.   Yes [provider]  Multiple Vitamins-Minerals (DRY EYE FORMULA PO) Take 2 drops by mouth daily as needed (dry eye).   Yes [provider]  naproxen (NAPROSYN) 500 MG tablet Take 500 mg by mouth daily as needed for mild pain.  03/15/18  Yes [provider]  omeprazole (PRILOSEC) 20 MG capsule Take 20 mg by mouth 2 (two) times daily. 06/16/16  Yes [provider]  terbinafine (LAMISIL) 250 MG tablet Take 250  mg by mouth daily. 03/15/18  Yes [provider]  tiZANidine (ZANAFLEX) 2 MG tablet Take 4 mg by mouth at bedtime as needed for muscle spasms.   Yes [provider]  HYDROcodone-acetaminophen (NORCO/VICODIN) 5-325 MG tablet Take 1-2 tablets by mouth every 6 (six) hours as needed for moderate pain. Patient not taking: Reported on 04/22/2018 03/10/18   Kerrin Champagne, MD  metroNIDAZOLE (METROGEL) 0.75 % vaginal gel Place 1 Applicatorful vaginally at bedtime. Apply one applicatorful to vagina at bedtime for 5 days Patient not taking: Reported on 04/22/2018 02/11/18   Marlis Edelson, CNM    Family History No family history on file.  Social History Social History   Tobacco Use  . Smoking status: Never Smoker  . Smokeless tobacco: Never Used  Substance Use Topics  . Alcohol use: Yes    Alcohol/week: 0.6 oz    Types: 1 Shots of liquor per week    Comment: sometimes   . Drug use: No     Allergies   Patient has no known allergies.   Review of Systems Review of Systems  Constitutional: Negative for chills and fever.  HENT: Negative for congestion.   Eyes: Negative for visual disturbance.  Respiratory: Positive for shortness of breath. Negative for cough.   Cardiovascular: Positive for chest pain and leg swelling.  Gastrointestinal: Negative for abdominal pain, diarrhea, nausea and vomiting.  Genitourinary: Negative for dysuria and hematuria.  Musculoskeletal: Positive for myalgias. Negative for back pain and neck pain.  Skin: Negative for rash.  Neurological: Negative for dizziness, weakness, numbness and headaches.  Psychiatric/Behavioral: Negative for confusion.  All other systems reviewed and are negative.    Physical Exam Updated Vital Signs BP (!) 199/105   Pulse 72   Temp 97.7 F (36.5 C) (Oral)   Resp 14   Ht 5\' 6"  (1.676 m)   Wt 90.7 kg (200 lb)   SpO2 98%   BMI 32.28 kg/m   Physical Exam  Constitutional: She is oriented to person, place, and  time. She appears well-developed and well-nourished.  HENT:  Head: Normocephalic and atraumatic.  Mouth/Throat: Oropharynx is clear and moist and mucous membranes are normal.  Eyes: Pupils are equal, round, and reactive to light. Conjunctivae, EOM and lids are normal.  Neck: Full passive range of motion without pain.  Cardiovascular: Normal rate, regular rhythm, normal heart sounds and normal pulses. Exam reveals no gallop and no friction rub.  No murmur heard. Pulmonary/Chest: Effort normal and breath sounds normal.  Lungs clear to auscultation bilaterally.  Symmetric chest rise.  No wheezing, rales, rhonchi.  No tenderness palpation anterior chest wall.  No deformity or crepitus noted.  Abdominal: Soft. Normal appearance. There is no tenderness. There is no rigidity and no guarding.  Abdomen is soft, non-distended, non-tender. No rigidity, No guarding. No peritoneal signs.  Musculoskeletal: Normal range of motion.  Bilateral lower extremities are symmetric in appearance without any overlying edema,  erythema, warmth.  Tenderness palpation noted to bilateral calves.  Full range of motion of bilateral upper extremities without any difficulty.  Full range of motion bilateral lower extremities without any difficulty.  Neurological: She is alert and oriented to person, place, and time.  Skin: Skin is warm and dry. Capillary refill takes less than 2 seconds.  Psychiatric: She has a normal mood and affect. Her speech is normal.  Nursing note and vitals reviewed.    ED Treatments / Results  Labs (all labs ordered are listed, but only abnormal results are displayed) Labs Reviewed  BASIC METABOLIC PANEL - Abnormal; Notable for the following components:      Result Value   Creatinine, Ser 1.58 (*)    GFR calc non Af Amer 33 (*)    GFR calc Af Amer 39 (*)    All other components within normal limits  CBC WITH DIFFERENTIAL/PLATELET - Abnormal; Notable for the following components:   Hemoglobin  11.7 (*)    All other components within normal limits  BRAIN NATRIURETIC PEPTIDE - Abnormal; Notable for the following components:   B Natriuretic Peptide 108.2 (*)    All other components within normal limits  D-DIMER, QUANTITATIVE (NOT AT [facility]) - Abnormal; Notable for the following components:   D-Dimer, Quant 1.44 (*)    All other components within normal limits  I-STAT TROPONIN, ED  I-STAT TROPONIN, ED    EKG EKG Interpretation  Date/Time:  Wednesday April 21 2018 18:21:32 EDT Ventricular Rate:  70 PR Interval:  164 QRS Duration: 82 QT Interval:  442 QTC Calculation: 477 R Axis:   -8 Text Interpretation:  Normal sinus rhythm Low voltage QRS Borderline ECG Confirmed by Gilda Crease [id number]) on 04/22/2018 12:23:36 AM Also confirmed by Gilda Crease [phone]), editor Sheppard Evens ([id number])  on 04/22/2018 2:20:49 PM   Radiology Dg Chest 2 View  Result Date: 04/21/2018 CLINICAL DATA:  Shortness of breath for 3 days. EXAM: CHEST - 2 VIEW COMPARISON:  Mar 19, 2017 FINDINGS: The heart size and mediastinal contours are stable. The aorta is tortuous. There is no focal infiltrate, pulmonary edema, or pleural effusion. The visualized skeletal structures are unremarkable. IMPRESSION: No active cardiopulmonary disease. Electronically Signed   By: Sherian Rein M.D.   On: 04/21/2018 19:32    Procedures Procedures (including critical care time)  Medications Ordered in ED Medications  fentaNYL (SUBLIMAZE) injection 50 mcg (50 mcg Intravenous Given 04/21/18 2351)  acetaminophen (TYLENOL) tablet 650 mg (650 mg Oral Given 04/22/18 1055)     Initial Impression / Assessment and Plan / ED Course  I have reviewed the triage vital signs and the nursing notes.  Pertinent labs & imaging results that were available during my care of the patient were reviewed by me and considered in my medical decision making (see chart for details).     67 year old female possible history of  arthritis, hypertension who presents for evaluation of diffuse pain and shortness of breath.  Reports that this is been worsening over the last 3 to 4 days.  Patient also reports that she has been having some constant dull chest pain over the last week.  Reports her shortness of breath is worse with exertion.  Patient also reports that her chest pain is worse with exertion.  She states she does not get diaphoretic or nauseous with the pain.  She states that shortness of breath does not make her pain worse.  No PE risk factors.  No history of heart  issues.  She is not a current smoker.  No history of CHF.  Consider ACS etiology versus infectious in the versus electrolyte imbalance versus symptomatic anemia.  Low suspicion for PE at this time but also consideration given complaints.  Initial labs ordered at triage.  Will add d-dimer.  Initial troponin negative.  BMP shows creatinine at 1.58.  Patient's baseline seems to be 1.6.  He shows slight anemia with hemoglobin 11.7.  Looks to be similar to patient's baseline.  BNP slightly elevated at 108.2.  Chest x-ray reveals no acute infectious etiology. Workup is unrevealing for cause of patient's symptoms. Given unclear etiology of SOB, will add on D-dimer for evaluation of PE.   Given history/physical exam and risk factors, patient has a heart score 4.  We will plan to delta troponin.  Delta Trop negative. At this time given duration of symptoms, and two negative trops here in the ED, do not suspect ACS etiology at this time.  D-dimer is elevated 1.40. Will proceed with CTA for evaluation of PE.   Patient signed out to Dr. Blinda Leatherwood with CTA chest pending. Please see his note for further ED course.   Final Clinical Impressions(s) / ED Diagnoses   Final diagnoses:  SOB (shortness of breath)    ED Discharge Orders    None       Maxwell Caul, PA-C 04/22/18 1716    Gilda Crease, MD 04/22/18 [phone]

## 2018-04-21 NOTE — ED Provider Notes (Signed)
Patient placed in Quick Look pathway, seen and evaluated   Chief Complaint: sob, cp   HPI:   Onset for 3-4 days, + doe, BL leg swelling   ROS:sob cp  (one)  Physical Exam:   Gen: No distress  Neuro: Awake and Alert  Skin: Warm    Focused Exam: lungs ctab   Initiation of care has begun. The patient has been counseled on the process, plan, and necessity for staying for the completion/evaluation, and the remainder of the medical screening examination    Arthor Captain, Cordelia Poche 04/21/18 1826    Mesner, Shadoe Cower, MD 04/22/18 1622

## 2018-04-22 ENCOUNTER — Encounter (HOSPITAL_COMMUNITY): Payer: Self-pay | Admitting: *Deleted

## 2018-04-22 ENCOUNTER — Emergency Department (HOSPITAL_COMMUNITY): Payer: Medicare Other

## 2018-04-22 ENCOUNTER — Emergency Department (HOSPITAL_BASED_OUTPATIENT_CLINIC_OR_DEPARTMENT_OTHER): Payer: Medicare Other

## 2018-04-22 ENCOUNTER — Emergency Department (HOSPITAL_COMMUNITY)
Admission: EM | Admit: 2018-04-22 | Discharge: 2018-04-22 | Disposition: A | Discharge status: Home / Self Care | Payer: Medicare Other | Source: Home / Self Care | Attending: Emergency Medicine | Admitting: Emergency Medicine

## 2018-04-22 DIAGNOSIS — R0600 Dyspnea, unspecified: Secondary | ICD-10-CM

## 2018-04-22 DIAGNOSIS — M79605 Pain in left leg: Secondary | ICD-10-CM

## 2018-04-22 DIAGNOSIS — Z79899 Other long term (current) drug therapy: Secondary | ICD-10-CM | POA: Insufficient documentation

## 2018-04-22 DIAGNOSIS — M79609 Pain in unspecified limb: Secondary | ICD-10-CM | POA: Diagnosis not present

## 2018-04-22 DIAGNOSIS — M79604 Pain in right leg: Secondary | ICD-10-CM | POA: Insufficient documentation

## 2018-04-22 DIAGNOSIS — I10 Essential (primary) hypertension: Secondary | ICD-10-CM

## 2018-04-22 LAB — CBC WITH DIFFERENTIAL/PLATELET
Basophils Absolute: 0 10*3/uL (ref 0.0–0.1)
Basophils Relative: 0 %
Eosinophils Absolute: 0.3 10*3/uL (ref 0.0–0.7)
Eosinophils Relative: 5 %
HCT: 37.8 % (ref 36.0–46.0)
Hemoglobin: 12.3 g/dL (ref 12.0–15.0)
Lymphocytes Relative: 33 %
Lymphs Abs: 1.7 10*3/uL (ref 0.7–4.0)
MCH: 29.6 pg (ref 26.0–34.0)
MCHC: 32.5 g/dL (ref 30.0–36.0)
MCV: 90.9 fL (ref 78.0–100.0)
Monocytes Absolute: 0.4 10*3/uL (ref 0.1–1.0)
Monocytes Relative: 8 %
Neutro Abs: 2.9 10*3/uL (ref 1.7–7.7)
Neutrophils Relative %: 54 %
Platelets: 231 10*3/uL (ref 150–400)
RBC: 4.16 MIL/uL (ref 3.87–5.11)
RDW: 13.8 % (ref 11.5–15.5)
WBC: 5.2 10*3/uL (ref 4.0–10.5)

## 2018-04-22 LAB — COMPREHENSIVE METABOLIC PANEL
ALT: 19 U/L (ref 0–44)
AST: 25 U/L (ref 15–41)
Albumin: 4.4 g/dL (ref 3.5–5.0)
Alkaline Phosphatase: 77 U/L (ref 38–126)
Anion gap: 11 (ref 5–15)
BUN: 18 mg/dL (ref 8–23)
CO2: 25 mmol/L (ref 22–32)
Calcium: 9.7 mg/dL (ref 8.9–10.3)
Chloride: 103 mmol/L (ref 98–111)
Creatinine, Ser: 1.47 mg/dL — ABNORMAL HIGH (ref 0.44–1.00)
GFR calc Af Amer: 42 mL/min — ABNORMAL LOW (ref >60)
GFR calc non Af Amer: 36 mL/min — ABNORMAL LOW (ref >60)
Glucose, Bld: 83 mg/dL (ref 70–99)
Potassium: 3.7 mmol/L (ref 3.5–5.1)
Sodium: 139 mmol/L (ref 135–145)
Total Bilirubin: 0.6 mg/dL (ref 0.3–1.2)
Total Protein: 8.4 g/dL — ABNORMAL HIGH (ref 6.5–8.1)

## 2018-04-22 LAB — TROPONIN I: Troponin I: <0.03 ng/mL (ref <0.03)

## 2018-04-22 LAB — BRAIN NATRIURETIC PEPTIDE: B Natriuretic Peptide: 133.9 pg/mL — ABNORMAL HIGH (ref 0.0–100.0)

## 2018-04-22 LAB — I-STAT TROPONIN, ED: Troponin i, poc: 0 ng/mL (ref 0.00–0.08)

## 2018-04-22 LAB — D-DIMER, QUANTITATIVE: D-Dimer, Quant: 1.44 ug/mL-FEU — ABNORMAL HIGH (ref 0.00–0.50)

## 2018-04-22 MED ORDER — POTASSIUM CHLORIDE CRYS ER 20 MEQ PO TBCR: 40.0000 meq | EXTENDED_RELEASE_TABLET | Freq: Every day | ORAL | 0 refills | Status: DC

## 2018-04-22 MED ORDER — HYDROCHLOROTHIAZIDE 12.5 MG PO CAPS
25.0000 mg | ORAL_CAPSULE | Freq: Once | ORAL | Status: AC
  Administered 2018-04-22: 25 mg via ORAL
  Filled 2018-04-22: qty 2

## 2018-04-22 MED ORDER — IOPAMIDOL (ISOVUE-370) INJECTION 76%
INTRAVENOUS | Status: AC
  Filled 2018-04-22: qty 100

## 2018-04-22 MED ORDER — IOPAMIDOL (ISOVUE-370) INJECTION 76%
80.0000 mL | Freq: Once | INTRAVENOUS | Status: AC | PRN
  Administered 2018-04-22: 75 mL via INTRAVENOUS

## 2018-04-22 MED ORDER — FENTANYL CITRATE (PF) 100 MCG/2ML IJ SOLN
50.0000 ug | Freq: Once | INTRAMUSCULAR | Status: AC
  Administered 2018-04-22: 50 ug via INTRAVENOUS
  Filled 2018-04-22: qty 2

## 2018-04-22 MED ORDER — ACETAMINOPHEN 325 MG PO TABS
650.0000 mg | ORAL_TABLET | Freq: Once | ORAL | Status: AC
  Administered 2018-04-22: 650 mg via ORAL
  Filled 2018-04-22: qty 2

## 2018-04-22 MED ORDER — HYDROCHLOROTHIAZIDE 25 MG PO TABS: 25.0000 mg | ORAL_TABLET | Freq: Every day | ORAL | 0 refills | Status: DC

## 2018-04-22 NOTE — ED Provider Notes (Signed)
Patient presented to the ER with chest pain and shortness of breath.  Patient has been experiencing symptoms for several days.  She has intermittent chest pain that is not exertional in nature.  She has shortness of breath that is exertional in nature.  These 2 symptoms do not necessarily occur together.  Face to face Exam: HEENT - PERRLA Lungs - CTAB Heart - RRR, no M/R/G Abd - S/NT/ND Neuro - alert, oriented x3  Plan: Patient appears well.  No oxygen demand.  Vital signs are normal other than hypertension.  No evidence of congestive heart failure.  Troponin negative x2.  PE considered in this patient because there was no other explanation for her symptoms.  D-dimer is elevated, will require imaging.  Multiple attempts at IV suitable for CT angiography were performed by nursing staff, IV team and myself.  This was not possible.  Patient will therefore have a VQ scan this morning.  If VQ scan negative, can be discharged.  Will sign out to oncoming ER physician.  Angiocath insertion Performed by: Gilda Crease  Consent: Verbal consent obtained. Risks and benefits: risks, benefits and alternatives were discussed Time out: Immediately prior to procedure a "time out" was called to verify the correct patient, procedure, equipment, support staff and site/side marked as required.  Preparation: Patient was prepped and draped in the usual sterile fashion.  Vein Location: right upper arm   Ultrasound Guided  Gauge: 20  Initial flash of blood noted, but unable to aspirate blood, flushed with some resistance and therefore was not used  Patient tolerance: Patient tolerated the procedure well with no immediate complications.      Gilda Crease, MD 04/22/18 [phone]

## 2018-04-22 NOTE — ED Notes (Signed)
Pt requesting to not have monitors on continuously.

## 2018-04-22 NOTE — ED Triage Notes (Signed)
Pt complains of bilateral calf pain and shortness of breath for the past couple of weeks. Pt denies swelling. Pt states she has difficulty walking. Pt was seen at [facility] this morning but left AMA.

## 2018-04-22 NOTE — ED Notes (Signed)
Patient returned from CT, IV infiltrated. IV Team consulted again.

## 2018-04-22 NOTE — ED Notes (Signed)
MD aware of pt BP. No neuro deficits noted at this time.

## 2018-04-22 NOTE — ED Notes (Signed)
IV team unable to obtain access. MD made aware.

## 2018-04-22 NOTE — ED Provider Notes (Signed)
Emergency Department Provider Note   I have reviewed the triage vital signs and the nursing notes.   HISTORY  Chief Complaint Leg Pain (bilateral )   HPI Michelle Graves is a 67 y.o. female with medical problems documented below the presents the emergency department today after leaving AMA from [facility] a few hours prior.  Patient had waited there for 14 hours to finish work-up for bilateral lower extremity swelling.  Found to have a elevated d-dimer.  No fever or cough.  No abdominal pain.  Been going on for multiple weeks and worsening.  No other associated or modifying symptoms.   Past Medical History:  Diagnosis Date  . Arthritis   . Chest pain   . Diverticulitis   . Hypertension     Patient Active Problem List   Diagnosis Date Noted  . Diverticulitis of intestine without perforation or abscess without bleeding   . Sinus tachycardia 08/27/2016  . Diverticulitis 08/26/2016  . HYPERTENSION, MALIGNANT ESSENTIAL 06/24/2007  . KIDNEY DISEASE, CHRONIC, STAGE III 06/24/2007  . ARTHRITIS, RHEUMATOID, SEROPOSITIVE 06/24/2007    Past Surgical History:  Procedure Laterality Date  . TONSILLECTOMY      Current Outpatient Rx  . Order #: [id number] Class: Historical Med  . Order #: [id number] Class: Historical Med  . Order #: [id number] Class: Historical Med  . Order #: [id number] Class: Historical Med  . Order #: [id number] Class: Normal  . Order #: [id number] Class: Historical Med  . Order #: [id number] Class: Historical Med  . Order #: [id number] Class: Historical Med  . Order #: [id number] Class: Historical Med  . Order #: [id number] Class: Historical Med  . Order #: [id number] Class: Historical Med  . Order #: [id number] Class: Historical Med  . Order #: [id number] Class: Historical Med  . Order #: [id number] Class: Historical Med  . Order #: [id number] Class: Print  . Order #: [id number] Class: Print    Allergies Patient has no known allergies.  No family history on file.  Social  History Social History   Tobacco Use  . Smoking status: Never Smoker  . Smokeless tobacco: Never Used  Substance Use Topics  . Alcohol use: Yes    Alcohol/week: 0.6 oz    Types: 1 Shots of liquor per week    Comment: sometimes   . Drug use: No    Review of Systems  All other systems negative except as documented in the HPI. All pertinent positives and negatives as reviewed in the HPI. ____________________________________________   PHYSICAL EXAM:  VITAL SIGNS: ED Triage Vitals  Enc Vitals Group     BP 04/22/18 1729 (!) 189/125     Pulse Rate 04/22/18 1729 90     Resp 04/22/18 1729 20     Temp 04/22/18 1729 98.2 F (36.8 C)     Temp Source 04/22/18 1729 Oral     SpO2 04/22/18 1729 95 %    Constitutional: Alert and oriented. Well appearing and in no acute distress. Eyes: Conjunctivae are normal. PERRL. EOMI. Head: Atraumatic. Nose: No congestion/rhinnorhea. Mouth/Throat: Mucous membranes are moist.  Oropharynx non-erythematous. Neck: No stridor.  No meningeal signs.   Cardiovascular: Normal rate, regular rhythm. Good peripheral circulation. Grossly normal heart sounds.   Respiratory: Normal respiratory effort.  No retractions. Lungs CTAB. Gastrointestinal: Soft and nontender. No distention.  Musculoskeletal: No lower extremity tenderness nor edema. No gross deformities of extremities. Neurologic:  Normal speech and language. No gross focal neurologic deficits are appreciated.  Skin:  Skin is warm, dry and intact. No rash noted.   ____________________________________________  LABS (all labs ordered are listed, but only abnormal results are displayed)  Labs Reviewed  COMPREHENSIVE METABOLIC PANEL - Abnormal; Notable for the following components:      Result Value   Creatinine, Ser 1.47 (*)    Total Protein 8.4 (*)    GFR calc non Af Amer 36 (*)    GFR calc Af Amer 42 (*)    All other components within normal limits  BRAIN NATRIURETIC PEPTIDE - Abnormal;  Notable for the following components:   B Natriuretic Peptide 133.9 (*)    All other components within normal limits  CBC WITH DIFFERENTIAL/PLATELET  TROPONIN I   ____________________________________________  EKG   EKG Interpretation  Date/Time:    Ventricular Rate:    PR Interval:    QRS Duration:   QT Interval:    QTC Calculation:   R Axis:     Text Interpretation:         ____________________________________________  RADIOLOGY  Ct Angio Chest Pe W And/or Wo Contrast  Result Date: 04/22/2018 CLINICAL DATA:  Bilateral calf pain and dyspnea for the past several weeks. EXAM: CT ANGIOGRAPHY CHEST WITH CONTRAST TECHNIQUE: Multidetector CT imaging of the chest was performed using the standard protocol during bolus administration of intravenous contrast. Multiplanar CT image reconstructions and MIPs were obtained to evaluate the vascular anatomy. CONTRAST:  37mL ISOVUE-370 IOPAMIDOL (ISOVUE-370) INJECTION 76% COMPARISON:  03/19/2017 FINDINGS: Cardiovascular: The study is of quality for the evaluation of pulmonary embolism. There are no filling defects in the central, lobar, segmental or subsegmental pulmonary artery branches to suggest acute pulmonary embolism. Common arterial trunk of the right brachiocephalic and left common carotid arteries, an anatomic variant. No significant stenosis of the great vessels. Normal heart size. No significant pericardial fluid/thickening. 4 cm ascending thoracic aortic aneurysm. No dissection. Coronary arteriosclerosis is noted along the LAD and diagonals. Mediastinum/Nodes: Mild thyromegaly with heterogeneous enhancement of the thyroid gland. 9 mm cystic nodule of the left thyroid gland, too small to further characterize. Unremarkable esophagus. No pathologically enlarged axillary, mediastinal or hilar lymph nodes. Lungs/Pleura: No pneumothorax. No pleural effusion. Subpleural atelectasis and/or scarring bilaterally. 3.5 mm in average subpleural right  lower lobe pulmonary nodule, series 6/92. No effusion or pneumothorax. Upper abdomen: Unremarkable. Musculoskeletal:  No aggressive appearing focal osseous lesions. Review of the MIP images confirms the above findings. IMPRESSION: 1. 4 cm ascending thoracic aortic aneurysm. Recommend annual imaging followup by CTA or MRA. This recommendation follows 2010 ACCF/AHA/AATS/ACR/ASA/SCA/SCAI/SIR/STS/SVM Guidelines for the Diagnosis and Management of Patients with Thoracic Aortic Disease. Circulation. 2010; 121: J159-B396 2. No aortic dissection. 3. No acute pulmonary embolus. 4. Coronary arteriosclerosis. 5. 3.5 mm in average right lower lobe pulmonary nodule. No follow-up needed if patient is low-risk. Non-contrast chest CT can be considered in 12 months if patient is high-risk. This recommendation follows the consensus statement: Guidelines for Management of Incidental Pulmonary Nodules Detected on CT Images: From the Fleischner Society 2017; Radiology 2017; 284:228-243. Aortic aneurysm NOS (ICD10-I71.9). Electronically Signed   By: Tollie Eth M.D.   On: 04/22/2018 20:11    ____________________________________________   PROCEDURES  Procedure(s) performed:   Procedures   ____________________________________________   INITIAL IMPRESSION / ASSESSMENT AND PLAN / ED COURSE  Her d-dimer was elevated so a CT scan which was negative.  Ultrasound of lower extremities were negative.  She does have history of rheumatoid arthritis that could be related to her symptoms.  No evidence of fluid overload on exam.  BNP was slightly elevated but it  was not pitting and patient without any pulmonary edema. Worsening hypertension, started on HCTZ but no e/o pulmonary edema as cause for symptoms.    Pertinent labs & imaging results that were available during my care of the patient were reviewed by me and considered in my medical decision making (see chart for  details).  ____________________________________________  FINAL CLINICAL IMPRESSION(S) / ED DIAGNOSES  Final diagnoses:  Pain in both lower extremities  Hypertension, unspecified type     MEDICATIONS GIVEN DURING THIS VISIT:  Medications  iopamidol (ISOVUE-370) 76 % injection (has no administration in time range)  fentaNYL (SUBLIMAZE) injection 50 mcg (50 mcg Intravenous Given 04/22/18 1836)  iopamidol (ISOVUE-370) 76 % injection 80 mL (75 mLs Intravenous Contrast Given 04/22/18 1954)  hydrochlorothiazide (MICROZIDE) capsule 25 mg (25 mg Oral Given 04/22/18 2052)     NEW OUTPATIENT MEDICATIONS STARTED DURING THIS VISIT:  Discharge Medication List as of 04/22/2018  8:33 PM    START taking these medications   Details  hydrochlorothiazide (HYDRODIURIL) 25 MG tablet Take 1 tablet (25 mg total) by mouth daily., Starting Thu 04/22/2018, Print    potassium chloride SA (K-DUR,KLOR-CON) 20 MEQ tablet Take 2 tablets (40 mEq total) by mouth daily., Starting Thu 04/22/2018, Print        Note:  This note was prepared with assistance of Dragon voice recognition software. Occasional wrong-word or sound-a-like substitutions may have occurred due to the inherent limitations of voice recognition software.   Marily Memos, MD 04/22/18 2303

## 2018-04-22 NOTE — ED Notes (Signed)
Patient ambulatory to bathroom with steady gait at this time 

## 2018-04-22 NOTE — ED Notes (Signed)
Ambulated pt. Down hallway with pulse ox, initially upon standing SATs dropped to 88% for a couple of seconds, then Increased to 95% right after initial episode.  Maintained 95% throughout walking, and then upon the return trip o2 SATs of consistently 99-100%.

## 2018-04-22 NOTE — ED Notes (Signed)
Patient got redressed ready to leave. Explained delay, patient is difficult stick so IV Team consulted for CTA. Patient dressed back in gown and willing to wait.

## 2018-04-22 NOTE — ED Notes (Signed)
Pt dressed sitting in the room, states she wants to leave. Pt informed of risks of leaving AMA. Pt still adamant about leaving. IV removed.

## 2018-04-22 NOTE — Progress Notes (Signed)
Bilateral lower extremity venous duplex has been completed. Negative for obvious evidence of DVT. Results were given to Dr. Clayborne Dana.   04/22/18 7:23 PM Olen Cordial RVT

## 2018-04-23 NOTE — ED Provider Notes (Signed)
Patient was awaiting VQ scan.  Patient decided she did not want to wait.  Since patient had elevated d-dimer not able to discharge patient.  Patient understood risk and left AMA.   Vanetta Mulders, MD 04/23/18 [phone]

## 2018-05-03 ENCOUNTER — Ambulatory Visit: Payer: Medicare Other | Admitting: Internal Medicine

## 2018-05-07 ENCOUNTER — Encounter: Payer: Self-pay | Admitting: Family

## 2018-05-19 ENCOUNTER — Emergency Department (HOSPITAL_COMMUNITY)
Admission: EM | Admit: 2018-05-19 | Discharge: 2018-05-19 | Disposition: A | Discharge status: Home / Self Care | Payer: Medicare Other | Attending: Emergency Medicine | Admitting: Emergency Medicine

## 2018-05-19 ENCOUNTER — Encounter (HOSPITAL_COMMUNITY): Payer: Self-pay | Admitting: Emergency Medicine

## 2018-05-19 ENCOUNTER — Other Ambulatory Visit: Payer: Self-pay

## 2018-05-19 DIAGNOSIS — Z7982 Long term (current) use of aspirin: Secondary | ICD-10-CM | POA: Diagnosis not present

## 2018-05-19 DIAGNOSIS — M791 Myalgia, unspecified site: Secondary | ICD-10-CM | POA: Diagnosis present

## 2018-05-19 DIAGNOSIS — I10 Essential (primary) hypertension: Secondary | ICD-10-CM | POA: Diagnosis not present

## 2018-05-19 DIAGNOSIS — Z79899 Other long term (current) drug therapy: Secondary | ICD-10-CM | POA: Diagnosis not present

## 2018-05-19 LAB — CBC WITH DIFFERENTIAL/PLATELET
Abs Immature Granulocytes: 0 10*3/uL (ref 0.0–0.1)
Basophils Absolute: 0.1 10*3/uL (ref 0.0–0.1)
Basophils Relative: 1 %
Eosinophils Absolute: 0.5 10*3/uL (ref 0.0–0.7)
Eosinophils Relative: 8 %
HCT: 36.5 % (ref 36.0–46.0)
Hemoglobin: 11.4 g/dL — ABNORMAL LOW (ref 12.0–15.0)
Immature Granulocytes: 0 %
Lymphocytes Relative: 32 %
Lymphs Abs: 1.9 10*3/uL (ref 0.7–4.0)
MCH: 29.3 pg (ref 26.0–34.0)
MCHC: 31.2 g/dL (ref 30.0–36.0)
MCV: 93.8 fL (ref 78.0–100.0)
Monocytes Absolute: 0.7 10*3/uL (ref 0.1–1.0)
Monocytes Relative: 12 %
Neutro Abs: 2.7 10*3/uL (ref 1.7–7.7)
Neutrophils Relative %: 47 %
Platelets: 205 10*3/uL (ref 150–400)
RBC: 3.89 MIL/uL (ref 3.87–5.11)
RDW: 13.4 % (ref 11.5–15.5)
WBC: 5.8 10*3/uL (ref 4.0–10.5)

## 2018-05-19 LAB — COMPREHENSIVE METABOLIC PANEL
ALT: 17 U/L (ref 0–44)
AST: 26 U/L (ref 15–41)
Albumin: 3.8 g/dL (ref 3.5–5.0)
Alkaline Phosphatase: 74 U/L (ref 38–126)
Anion gap: 14 (ref 5–15)
BUN: 13 mg/dL (ref 8–23)
CO2: 22 mmol/L (ref 22–32)
Calcium: 9 mg/dL (ref 8.9–10.3)
Chloride: 111 mmol/L (ref 98–111)
Creatinine, Ser: 1.58 mg/dL — ABNORMAL HIGH (ref 0.44–1.00)
GFR calc Af Amer: 39 mL/min — ABNORMAL LOW (ref >60)
GFR calc non Af Amer: 33 mL/min — ABNORMAL LOW (ref >60)
Glucose, Bld: 94 mg/dL (ref 70–99)
Potassium: 3.9 mmol/L (ref 3.5–5.1)
Sodium: 147 mmol/L — ABNORMAL HIGH (ref 135–145)
Total Bilirubin: 0.6 mg/dL (ref 0.3–1.2)
Total Protein: 7.1 g/dL (ref 6.5–8.1)

## 2018-05-19 LAB — CK: Total CK: 401 U/L — ABNORMAL HIGH (ref 38–234)

## 2018-05-19 LAB — C-REACTIVE PROTEIN: CRP: 2 mg/dL — ABNORMAL HIGH (ref <1.0)

## 2018-05-19 MED ORDER — PREDNISONE 20 MG PO TABS: 20.0000 mg | ORAL_TABLET | Freq: Every day | ORAL | 0 refills | Status: DC

## 2018-05-19 MED ORDER — METHYLPREDNISOLONE SODIUM SUCC 125 MG IJ SOLR
60.0000 mg | Freq: Once | INTRAMUSCULAR | Status: AC
  Administered 2018-05-19: 60 mg via INTRAVENOUS
  Filled 2018-05-19: qty 2

## 2018-05-19 MED ORDER — MORPHINE SULFATE (PF) 4 MG/ML IV SOLN
4.0000 mg | Freq: Once | INTRAVENOUS | Status: AC
  Administered 2018-05-19: 4 mg via INTRAVENOUS
  Filled 2018-05-19: qty 1

## 2018-05-19 MED ORDER — SODIUM CHLORIDE 0.9 % IV BOLUS
1000.0000 mL | Freq: Once | INTRAVENOUS | Status: AC
  Administered 2018-05-19: 1000 mL via INTRAVENOUS

## 2018-05-19 NOTE — ED Triage Notes (Signed)
Pt states pain and body aches to entire body, states she has "lumps" in her arms. She has had this for about 3 weeks. afebrile

## 2018-05-19 NOTE — ED Provider Notes (Signed)
[facility] EMERGENCY DEPARTMENT Provider Note   CSN: [id number] Arrival date & time: 05/19/18  1123     History   Chief Complaint No chief complaint on file.   HPI Michelle Graves is a 67 y.o. female.  The history is provided by the patient. No language interpreter was used.     67 year old female with history of hypertension, arthritis, kidney disease presenting to the ED for evaluation of her body aches.  Patient report for the past 2 to 3 weeks she has had persistent body aches.  States she hurts from top to bottom at all of her joints and muscles.  Symptom has been persistent nothing seems to make it better or worse.  She does have history of rheumatoid arthritis.  She denies any fever, chills, headache, lightheadedness, dizziness, chest pain, trouble breathing, dysuria.  No nausea vomiting diarrhea or recent medication changes.  She mentioned been seen by her PCP for her complaint recently, test was obtained and she have not heard of any results.  She denies any recent tick bite or mosquito bite or any recent travel.  No fever or rash.  She has been seen for the same symptoms several times within the past month.  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   Past Medical History:  Diagnosis Date  . Arthritis   . Chest pain   . Diverticulitis   . Hypertension     Patient Active Problem List   Diagnosis Date Noted  . Diverticulitis of intestine without perforation or abscess without bleeding   . Sinus tachycardia 08/27/2016  . Diverticulitis 08/26/2016  . HYPERTENSION, MALIGNANT ESSENTIAL 06/24/2007  . KIDNEY DISEASE, CHRONIC, STAGE III 06/24/2007  . ARTHRITIS, RHEUMATOID, SEROPOSITIVE 06/24/2007    Past Surgical History:  Procedure Laterality Date  . TONSILLECTOMY       OB History   None      Home Medications    Prior to Admission medications     Medication Sig Start Date End Date Taking? Authorizing Provider  amLODipine (NORVASC) 10 MG tablet Take 10 mg by mouth daily.    [provider]  aspirin EC 81 MG tablet Take 81 mg by mouth daily.    [provider]  calcium carbonate (OSCAL) 1500 (600 Ca) MG TABS tablet Take 600 mg of elemental calcium by mouth daily.    [provider]  Carboxymethylcellul-Glycerin (CLEAR EYES FOR DRY EYES OP) Place 2 drops into both eyes daily as needed (dry eyes).    [provider]  gabapentin (NEURONTIN) 300 MG capsule Take 1 capsule (300 mg total) by mouth 3 (three) times daily. 03/10/18   Kerrin Champagne, MD  hydrochlorothiazide (HYDRODIURIL) 25 MG tablet Take 1 tablet (25 mg total) by mouth daily. 04/22/18   Mesner, Anielle Cower, MD  hydroxychloroquine (PLAQUENIL) 200 MG tablet Take 200 mg by mouth 2 (two) times daily.  06/16/16   [provider]  lisinopril (PRINIVIL,ZESTRIL) 20 MG tablet Take 20 mg by mouth daily.    [provider]  metoprolol tartrate (LOPRESSOR) 100 MG tablet Take 100 mg by mouth 2 (two) times daily.  11/19/15   [provider]  Misc Natural Products (OSTEO BI-FLEX JOINT SHIELD PO) Take 1 tablet by mouth daily.    [provider]  Multiple Vitamins-Minerals (CENTRUM SILVER 50+WOMEN PO) Take 1 tablet by mouth daily.    [provider]  naproxen (NAPROSYN) 500 MG tablet Take 500 mg by mouth daily.  03/15/18   [provider]  omeprazole (PRILOSEC) 20 MG capsule Take 20 mg by mouth 2 (two) times daily. 06/16/16   [provider]  potassium chloride SA (K-DUR,KLOR-CON) 20 MEQ tablet Take 2 tablets (40 mEq total) by mouth daily. 04/22/18   Mesner, Leya Cower, MD  terbinafine (LAMISIL) 250 MG tablet Take 250 mg by mouth daily. 03/15/18   [provider]  tiZANidine (ZANAFLEX) 2 MG tablet Take 4 mg by mouth at bedtime.     [provider]    Family History No family history on file.  Social  History Social History   Tobacco Use  . Smoking status: Never Smoker  . Smokeless tobacco: Never Used  Substance Use Topics  . Alcohol use: Yes    Alcohol/week: 0.6 oz    Types: 1 Shots of liquor per week    Comment: sometimes   . Drug use: No     Allergies   Patient has no known allergies.   Review of Systems Review of Systems  All other systems reviewed and are negative.    Physical Exam Updated Vital Signs BP (!) 167/105 (BP Location: Left Arm)   Pulse 68   Temp 98.1 F (36.7 C) (Oral)   Resp 18   SpO2 100%   Physical Exam  Constitutional: She is oriented to person, place, and time. She appears well-developed and well-nourished. No distress.  HENT:  Head: Atraumatic.  Eyes: Conjunctivae are normal.  Neck: Neck supple.  Cardiovascular: Normal rate and regular rhythm.  Pulmonary/Chest: Effort normal and breath sounds normal.  Abdominal: Soft. Bowel sounds are normal. She exhibits no distension. There is no tenderness.  Musculoskeletal: She exhibits tenderness (Diffuse tenderness throughout entire body with gentle palpation but no focal point tenderness or overlying skin changes noted.). She exhibits no edema.  Neurological: She is alert and oriented to person, place, and time. GCS eye subscore is 4. GCS verbal subscore is 5. GCS motor subscore is 6.  Skin: No rash noted.  Psychiatric: She has a normal mood and affect.  Nursing note and vitals reviewed.    ED Treatments / Results  Labs (all labs ordered are listed, but only abnormal results are displayed) Labs Reviewed  COMPREHENSIVE METABOLIC PANEL - Abnormal; Notable for the following components:      Result Value   Sodium 147 (*)    Creatinine, Ser 1.58 (*)    GFR calc non Af Amer 33 (*)    GFR calc Af Amer 39 (*)    All other components within normal limits  CBC WITH DIFFERENTIAL/PLATELET - Abnormal; Notable for the following components:   Hemoglobin 11.4 (*)    All other components within normal  limits  C-REACTIVE PROTEIN - Abnormal; Notable for the following components:   CRP 2.0 (*)    All other components within normal limits  CK - Abnormal; Notable for the following components:   Total CK 401 (*)    All other components within normal limits  URINALYSIS, ROUTINE W REFLEX MICROSCOPIC    EKG None  Radiology No results found.  Procedures Procedures (including critical care time)  Medications Ordered in ED Medications  sodium chloride 0.9 % bolus 1,000 mL (1,000 mLs Intravenous New Bag/Given 05/19/18 1519)  methylPREDNISolone sodium succinate (SOLU-MEDROL) 125 mg/2 mL injection 60 mg (has no administration in time range)  morphine 4 MG/ML injection 4 mg (4 mg Intravenous Given 05/19/18 1517)  Initial Impression / Assessment and Plan / ED Course  I have reviewed the triage vital signs and the nursing notes.  Pertinent labs & imaging results that were available during my care of the patient were reviewed by me and considered in my medical decision making (see chart for details).     BP (!) 167/105 (BP Location: Left Arm)   Pulse 68   Temp 98.1 F (36.7 C) (Oral)   Resp 18   SpO2 100%    Final Clinical Impressions(s) / ED Diagnoses   Final diagnoses:  Myalgia    ED Discharge Orders        Ordered    predniSONE (DELTASONE) 20 MG tablet  Daily     05/19/18 1551     2:16 PM Patient here with persistent myalgias with pain throughout her entire body.  She has been seen in the ED several times for her complaint within the past month.  Her evaluation recently was unremarkable.  She is afebrile with stable vital sign.  Mild hypertension with a blood pressure of one 167/105.  Initial labs are reassuring.  Will obtain CK as well as CRP.  She denies any recent tick bite mosquito bite.  She is not on any medication that can potentially cause rhabdomyolysis or myalgias.  3:52 PM Mildly elevated total CK of 401.  Elevated CRP of 2.  Cr. 1.58.  Pt received IVF,  solumedrol.  Suspect sxs likely due to underlying RA.  Care discussed with Dr. Madilyn Hook who have evaluated pt and agrees.  Pt d/c with low dose steroids and to f/u with PCP for further care.     Fayrene Helper, PA-C 05/19/18 1554    Tilden Fossa, MD 05/20/18 [phone]

## 2018-05-19 NOTE — Discharge Instructions (Signed)
Please take steroid as prescribed for your muscle aches and follow up closely with your doctor for further care as your condition may be related to your rheumatoid arthritis.

## 2018-06-03 ENCOUNTER — Ambulatory Visit
Admission: RE | Admit: 2018-06-03 | Discharge: 2018-06-03 | Disposition: A | Discharge status: Home / Self Care | Payer: Medicare Other | Source: Ambulatory Visit | Attending: Pain Medicine | Admitting: Pain Medicine

## 2018-06-03 ENCOUNTER — Other Ambulatory Visit: Payer: Self-pay | Admitting: Pain Medicine

## 2018-06-03 DIAGNOSIS — M25562 Pain in left knee: Principal | ICD-10-CM

## 2018-06-03 DIAGNOSIS — M25561 Pain in right knee: Secondary | ICD-10-CM

## 2018-07-09 ENCOUNTER — Ambulatory Visit
Admission: RE | Admit: 2018-07-09 | Discharge: 2018-07-09 | Disposition: A | Discharge status: Home / Self Care | Payer: Medicare Other | Source: Ambulatory Visit | Attending: Internal Medicine | Admitting: Internal Medicine

## 2018-07-09 DIAGNOSIS — Z139 Encounter for screening, unspecified: Secondary | ICD-10-CM

## 2018-07-21 ENCOUNTER — Ambulatory Visit: Payer: Medicare Other | Admitting: Internal Medicine

## 2018-08-16 IMAGING — CR DG CHEST 2V
2 series · 2 of 2 positions shown · non-contrast
Comparison: 08/26/2016

CLINICAL DATA: Weakness and recent syncopal episode

EXAM:
CHEST  2 VIEW

[w chest lat]
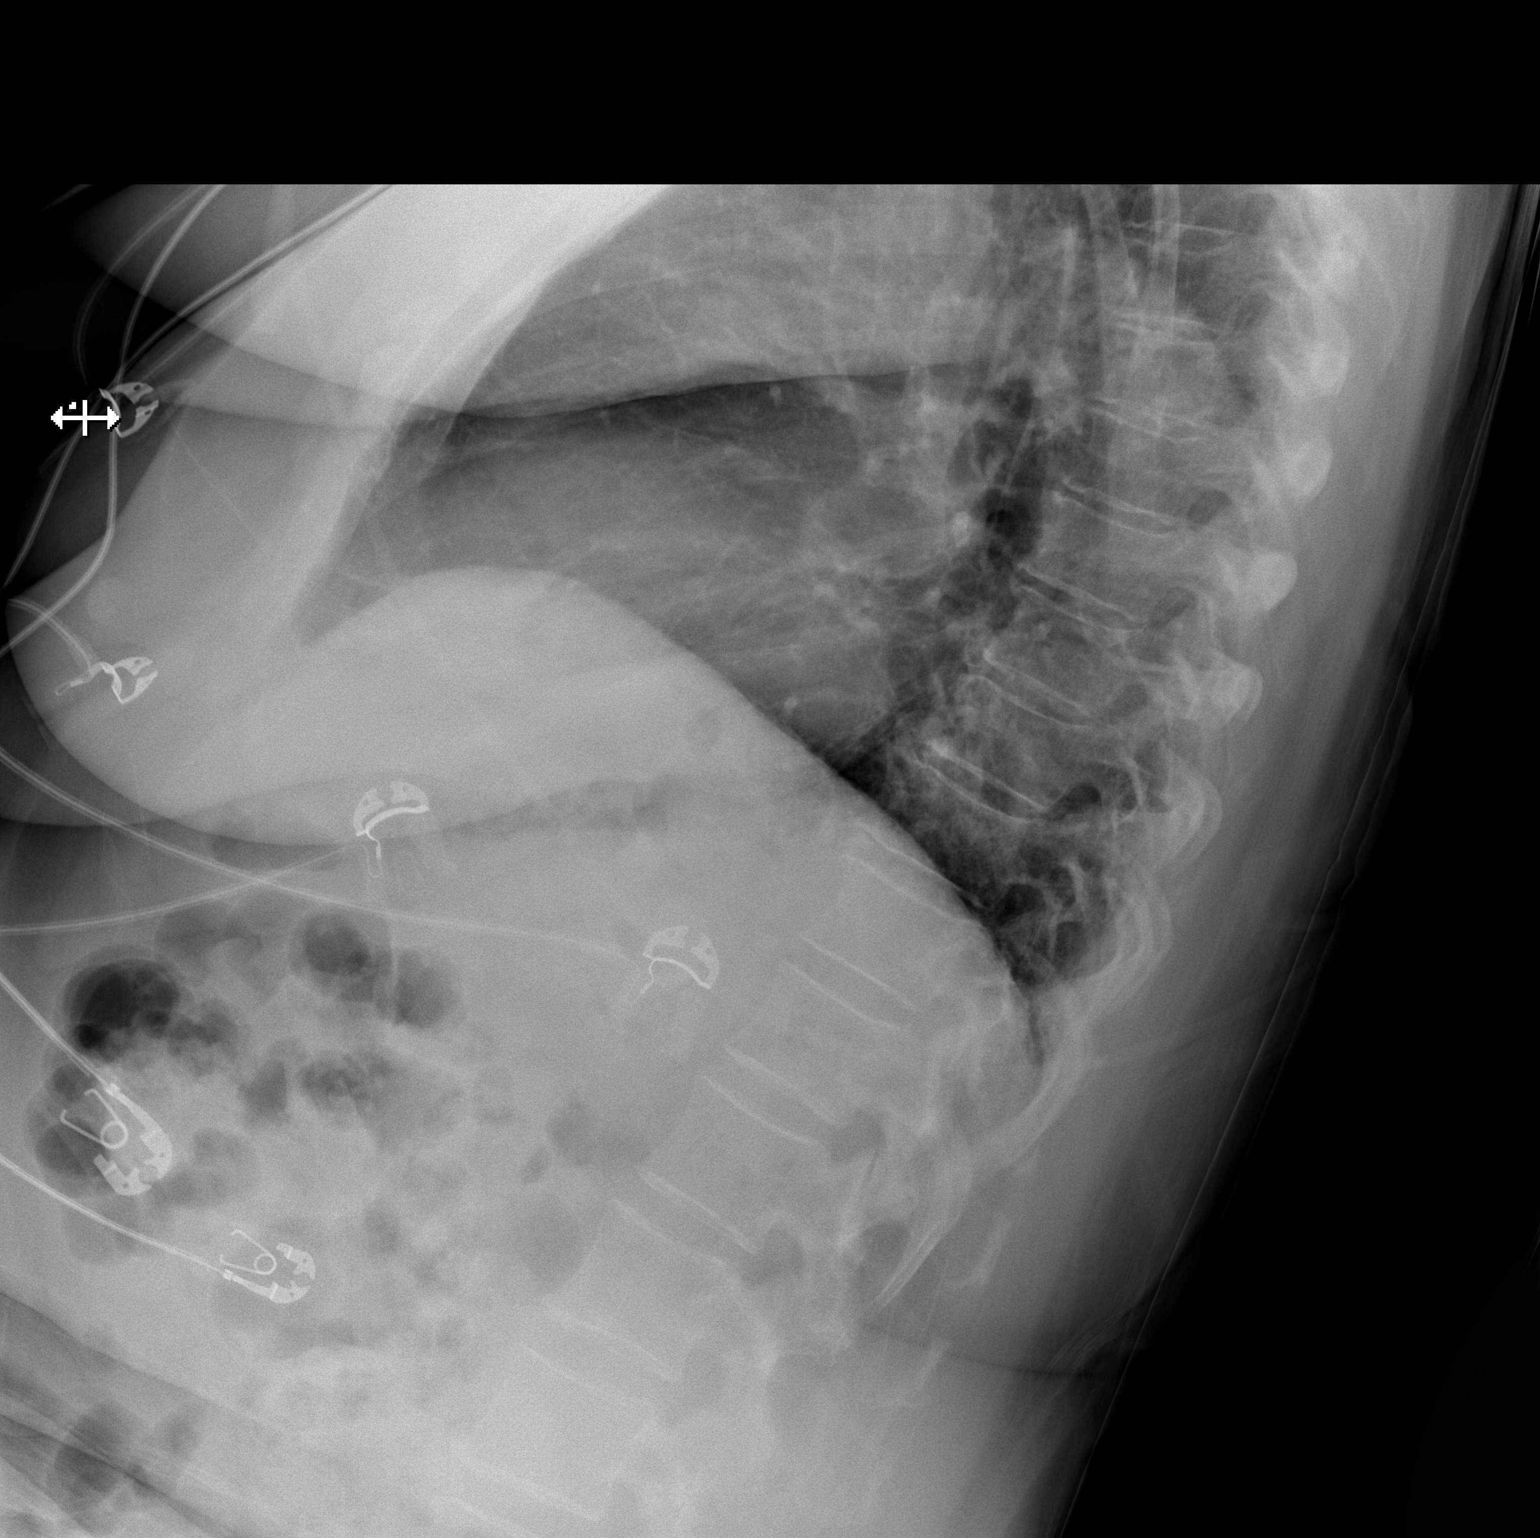

[x chest ap]
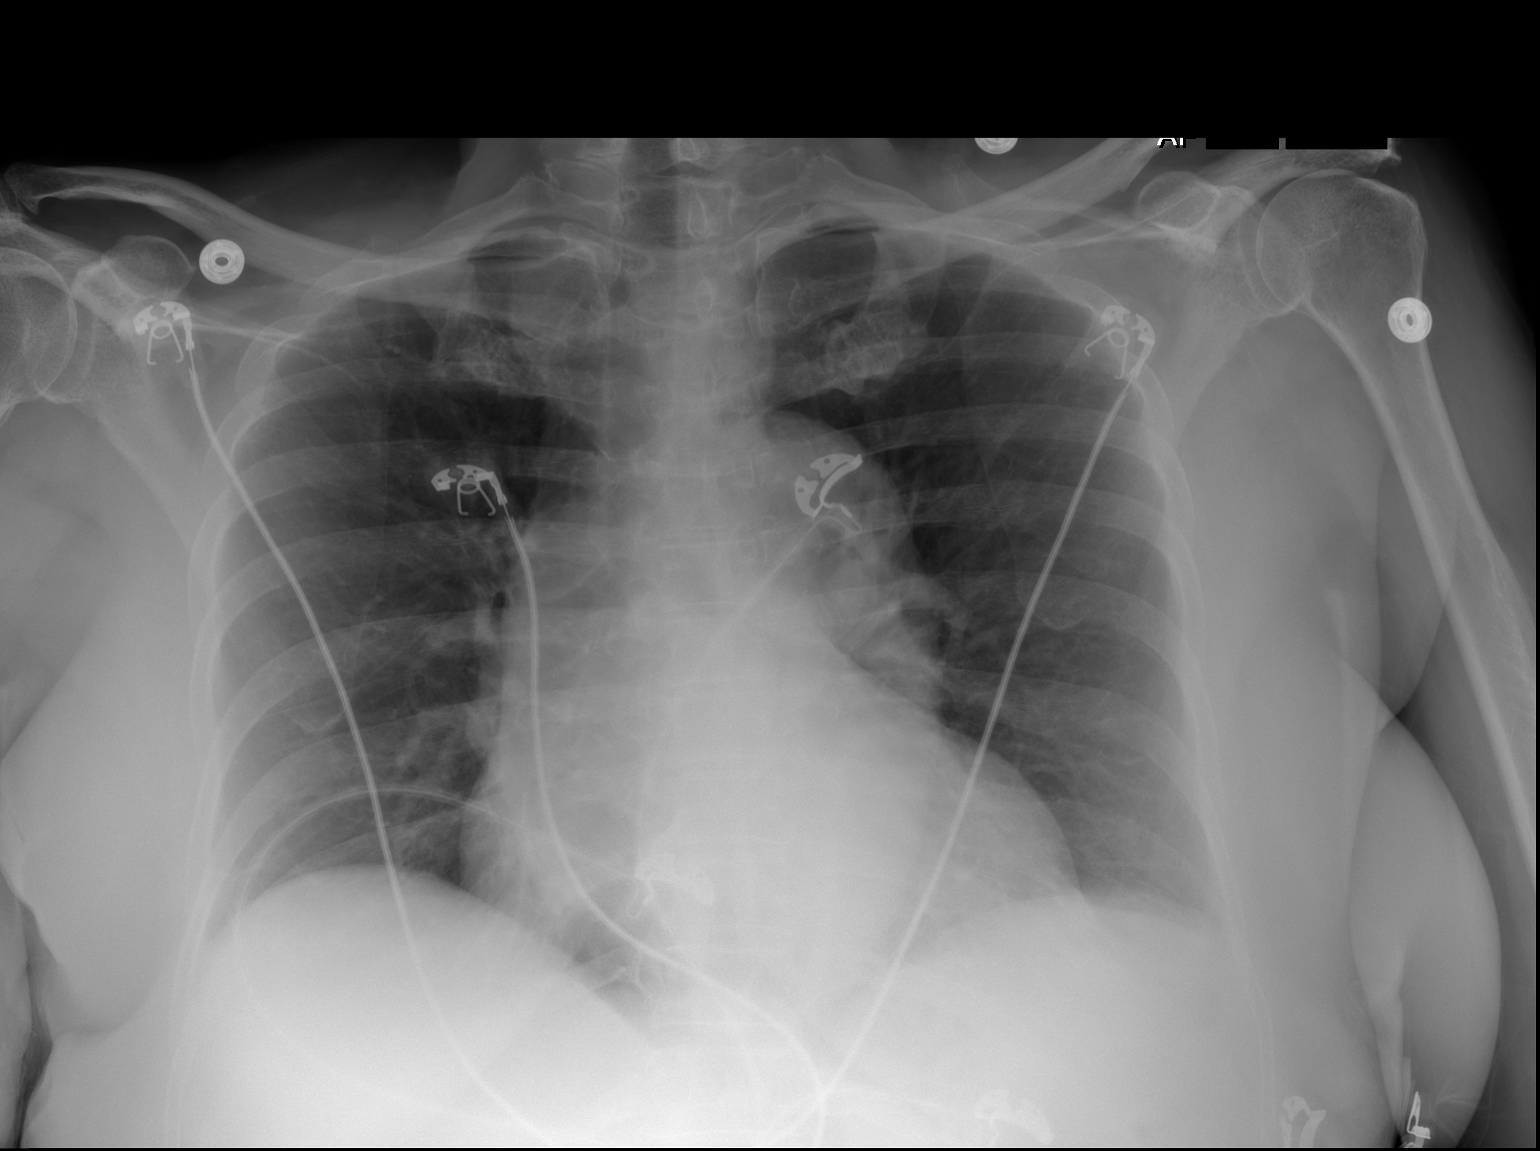

[2 of 2 positions shown; findings below may reference images not displayed]

FINDINGS: The heart size and mediastinal contours are within normal limits.
Both lungs are clear. The visualized skeletal structures are
unremarkable.
IMPRESSION: No active cardiopulmonary disease.

## 2018-08-26 ENCOUNTER — Ambulatory Visit: Payer: Medicare Other | Admitting: Internal Medicine

## 2018-09-17 ENCOUNTER — Other Ambulatory Visit: Payer: Self-pay | Admitting: Pain Medicine

## 2018-09-17 DIAGNOSIS — M25561 Pain in right knee: Secondary | ICD-10-CM

## 2018-10-15 ENCOUNTER — Ambulatory Visit
Admission: RE | Admit: 2018-10-15 | Discharge: 2018-10-15 | Disposition: A | Discharge status: Home / Self Care | Payer: Medicare Other | Source: Ambulatory Visit | Attending: Pain Medicine | Admitting: Pain Medicine

## 2018-10-15 DIAGNOSIS — M25561 Pain in right knee: Secondary | ICD-10-CM

## 2018-10-18 IMAGING — CR DG CHEST 2V
2 series · 2 of 2 positions shown · non-contrast
Comparison: 01/15/2017, 08/26/2016

CLINICAL DATA: Pain between the scapula

EXAM:
CHEST  2 VIEW

[chest pa]
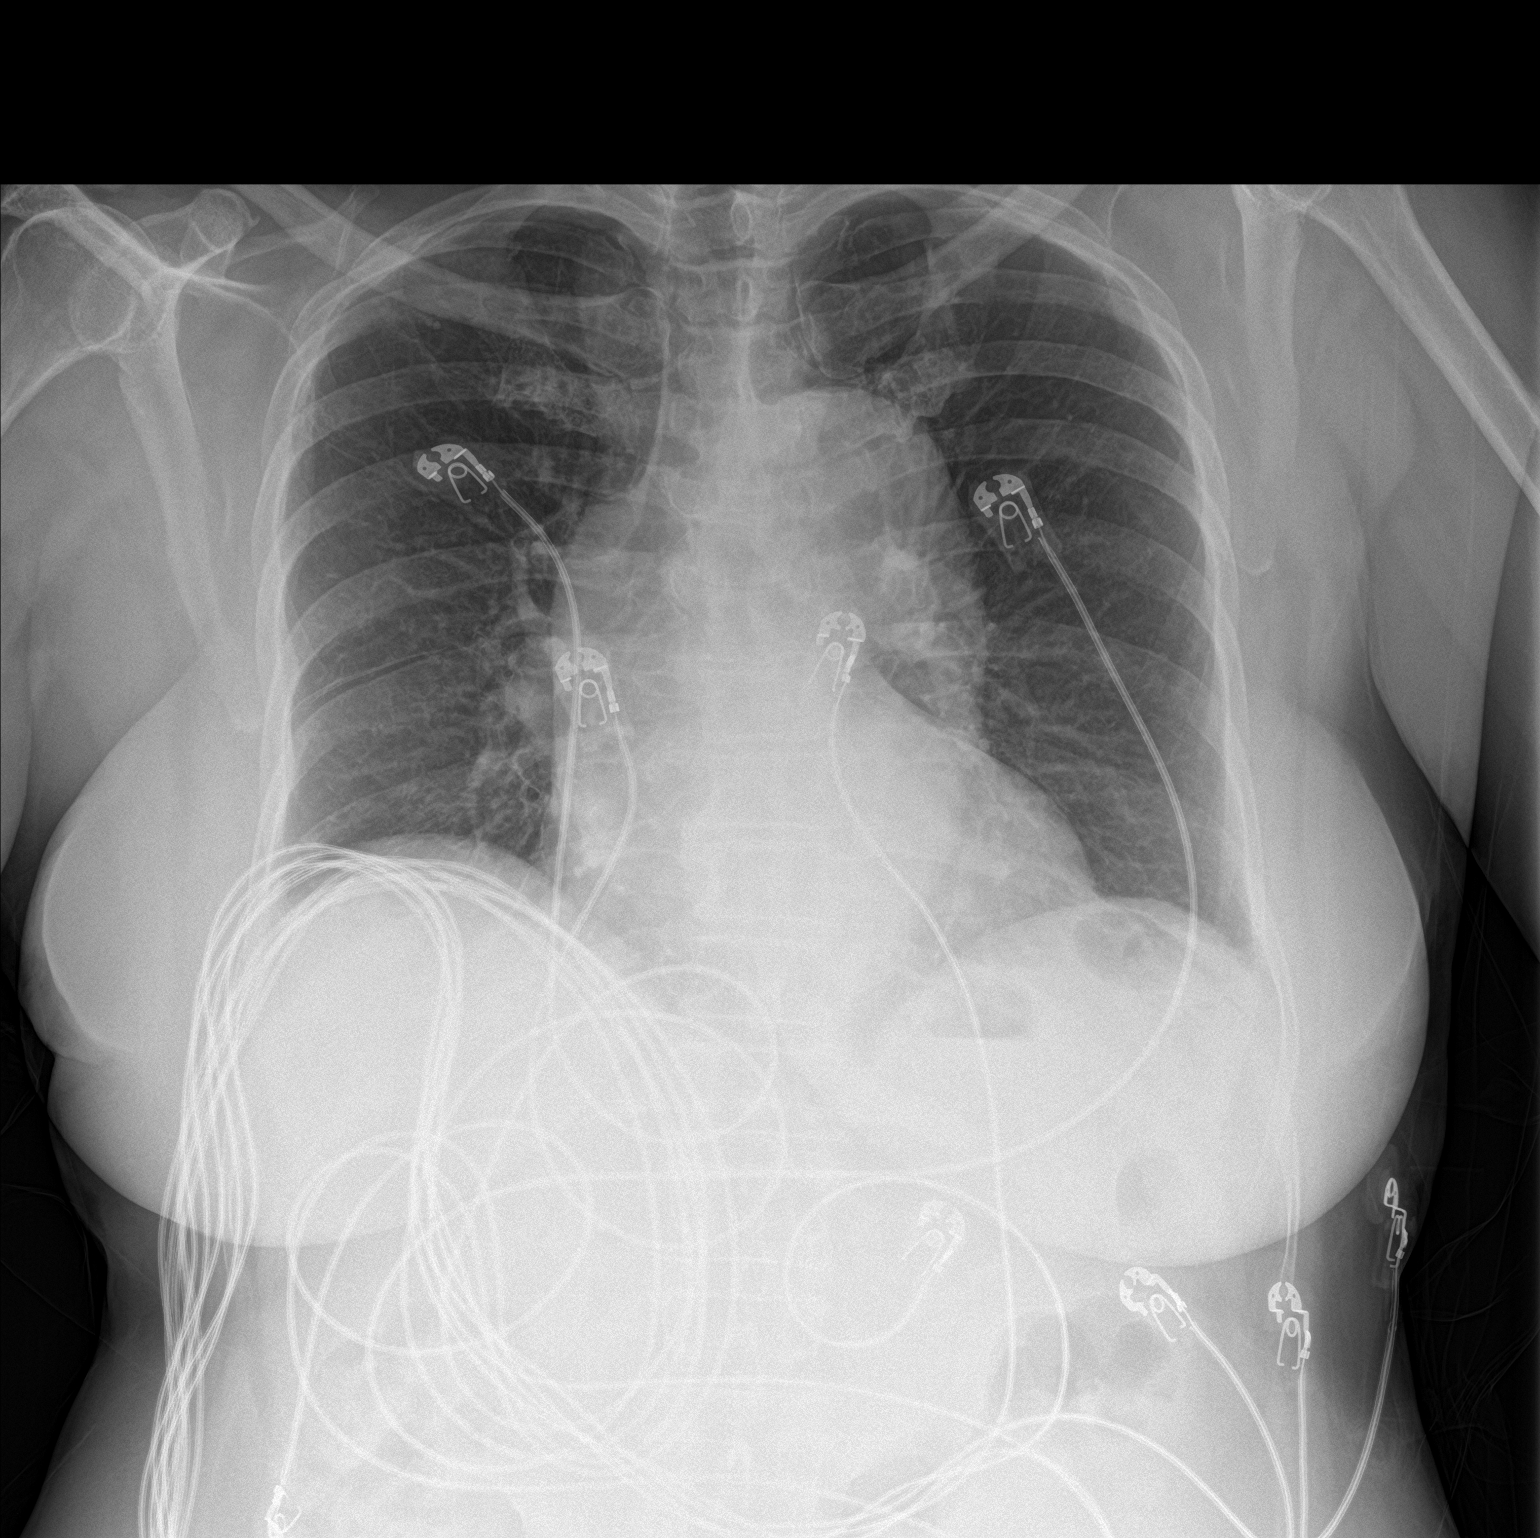

[chest lat]
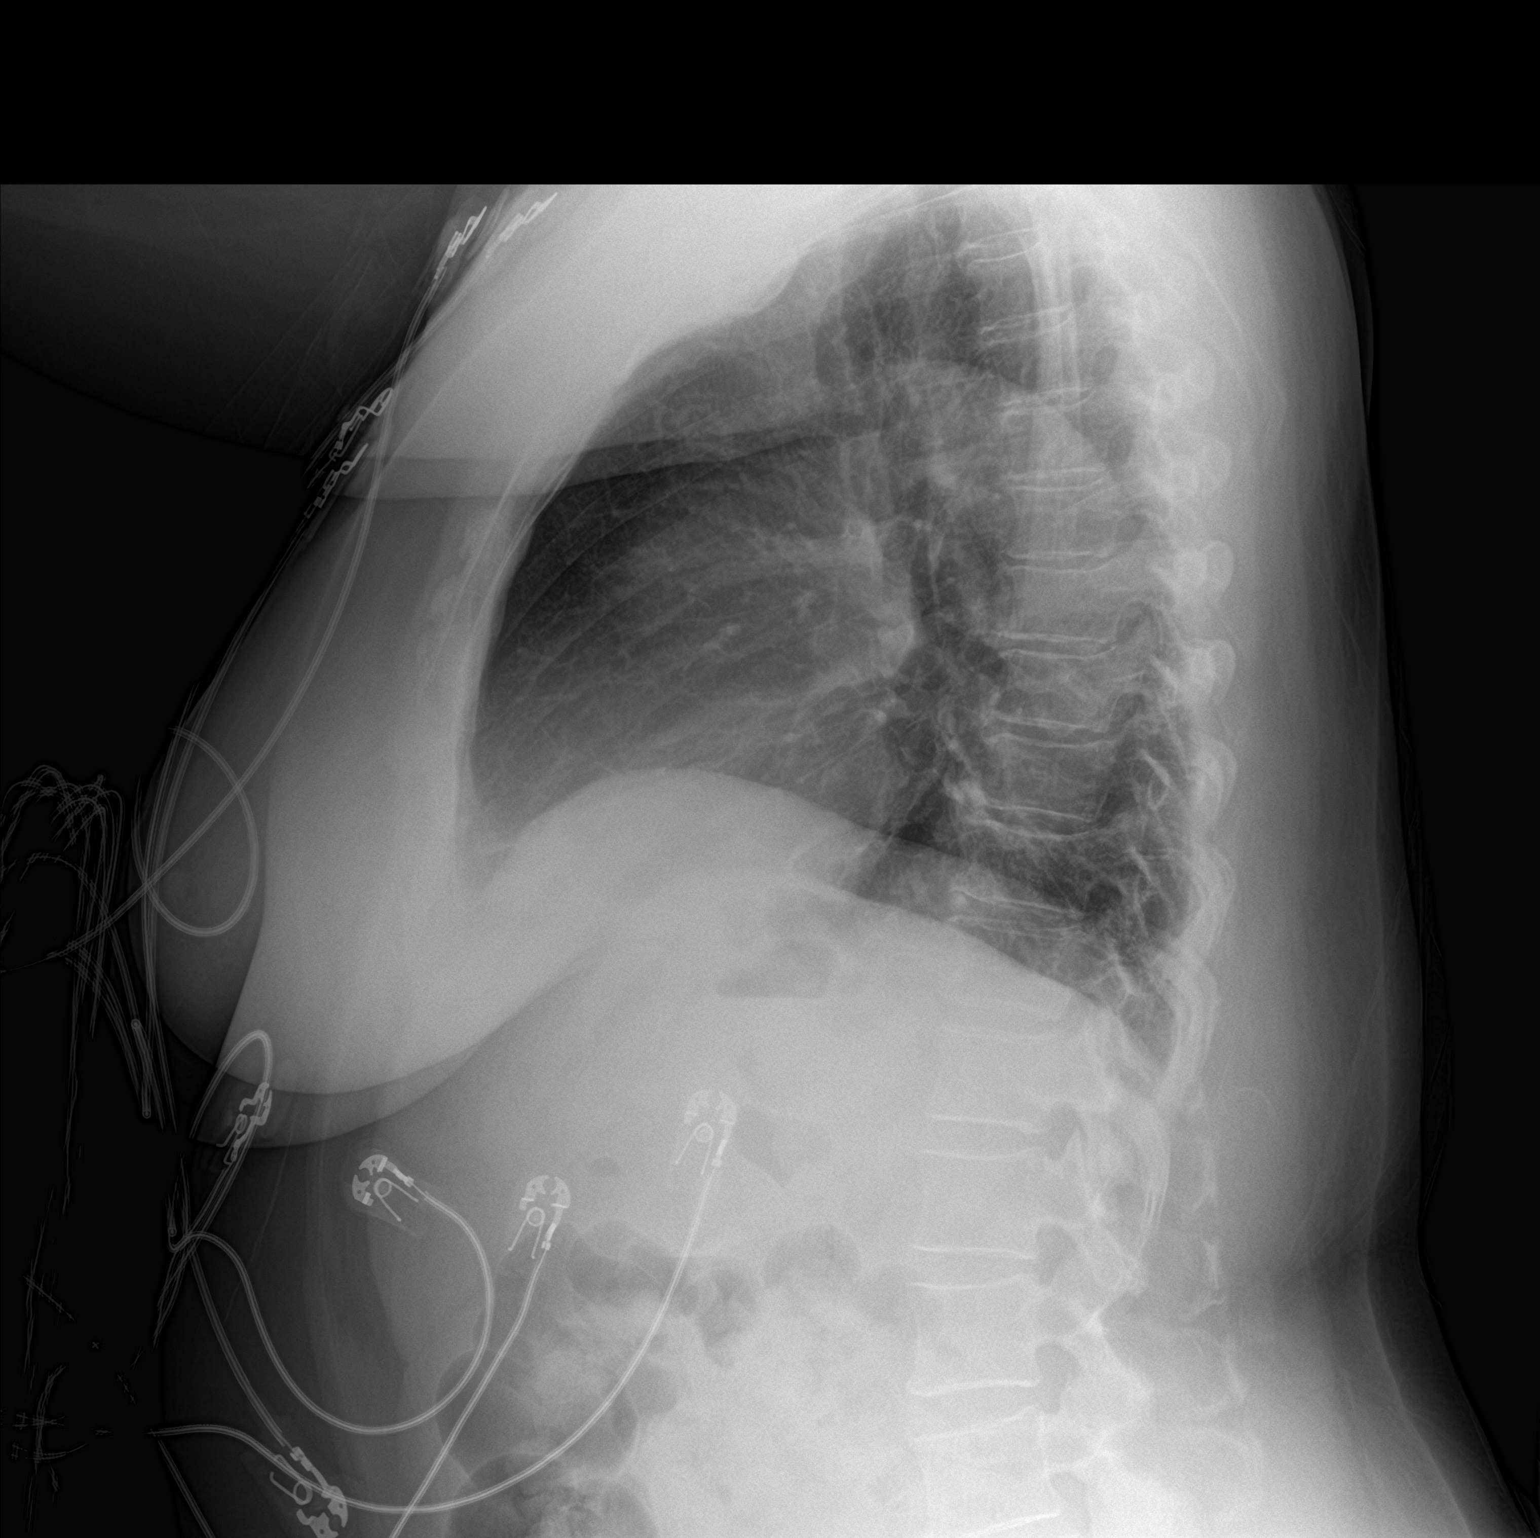

[2 of 2 positions shown; findings below may reference images not displayed]

FINDINGS: Mildly low lung volume. No acute consolidation or pleural effusion.
Stable cardiomediastinal silhouette with tortuous aorta. No
pneumothorax.
IMPRESSION: Mildly low lung volumes.  No acute interval changes.

## 2018-10-18 IMAGING — CT CT ANGIO CHEST
3 of 11 series · 18 of 46 positions shown · IV contrast (APPLIED)
Comparison: None.

CLINICAL DATA: Shortness of breath, chest pain and abdominal pain.
Elevated D-dimer.

EXAM:
CT ANGIOGRAPHY CHEST
CT ABDOMEN AND PELVIS WITH CONTRAST
TECHNIQUE: Multidetector CT imaging of the chest was performed using the
standard protocol during bolus administration of intravenous
contrast. Multiplanar CT image reconstructions and MIPs were
obtained to evaluate the vascular anatomy. Multidetector CT imaging
of the abdomen and pelvis was performed using the standard protocol
during bolus administration of intravenous contrast.
CONTRAST:  100 mL Isovue 370

[Series 6: thins · axial · 0.75mm/px · z∈[+1245,+1434]mm · 14 of 219 slices shown]
[im 15/219  lung]
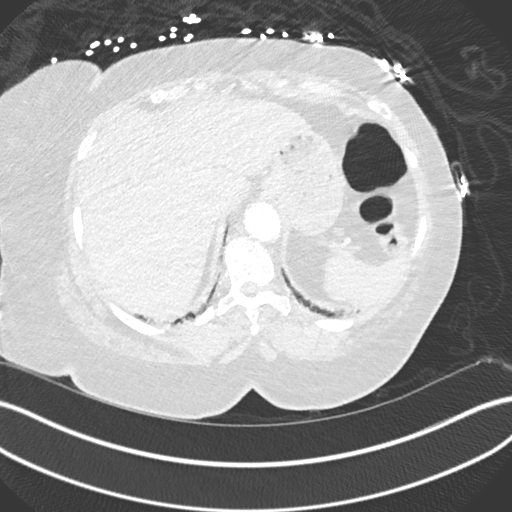
[im 30/219  soft-tissue]
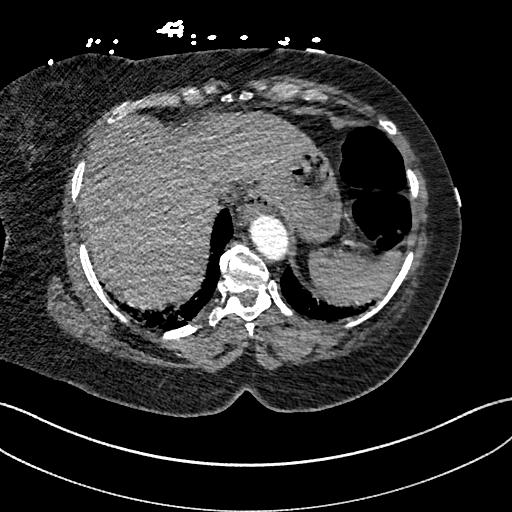
[im 44/219  lung]
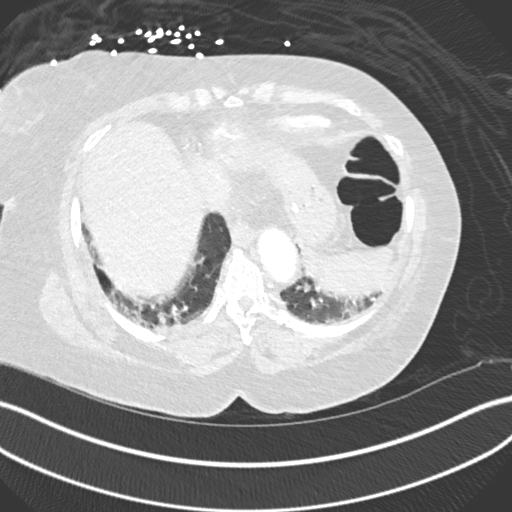
[im 59/219  soft-tissue]
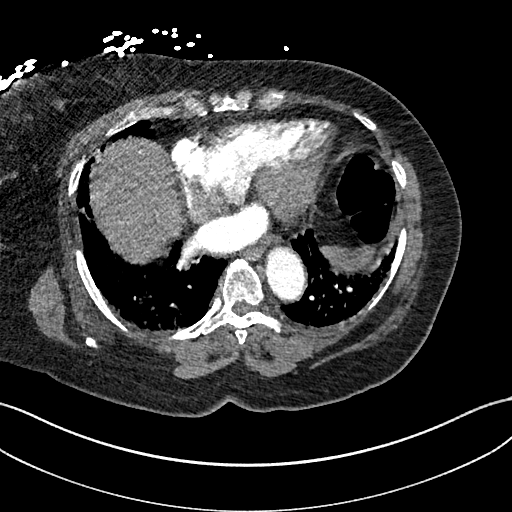
[im 73/219  lung]
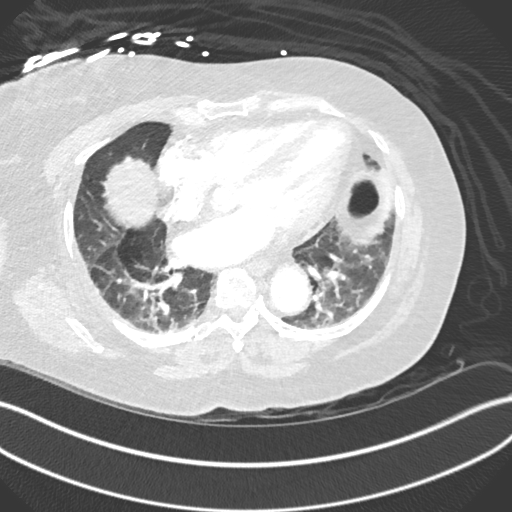
[im 88/219  soft-tissue]
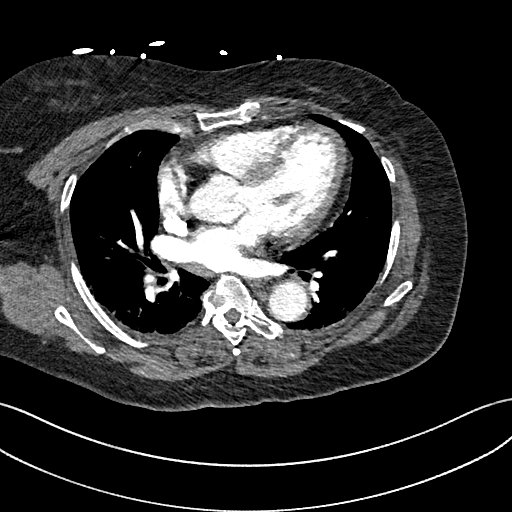
[im 102/219  lung]
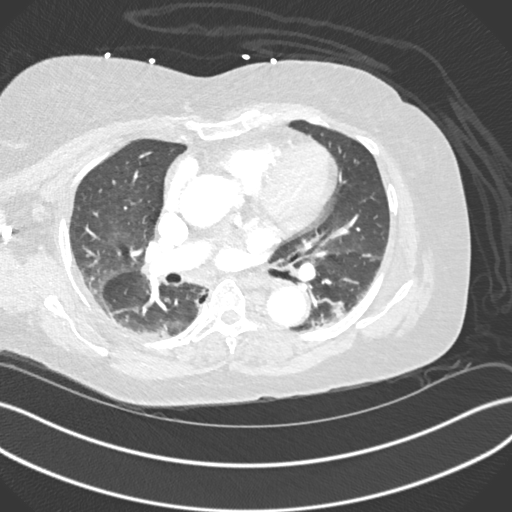
[im 117/219  soft-tissue]
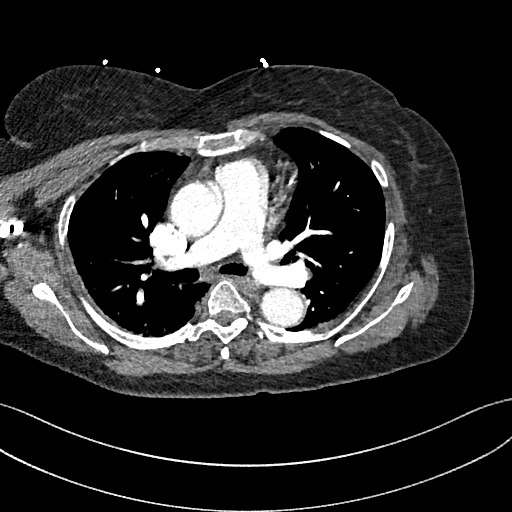
[im 131/219  lung]
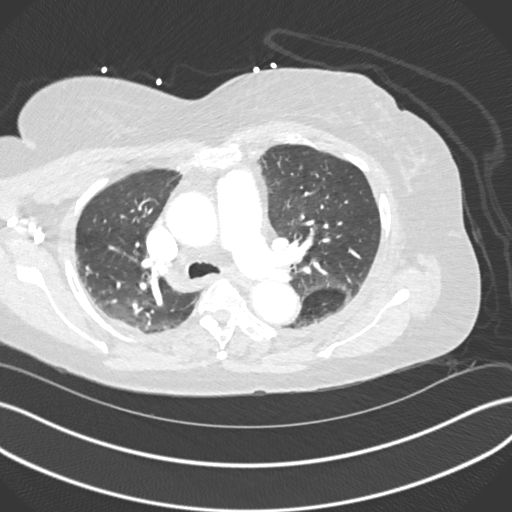
[im 146/219  soft-tissue]
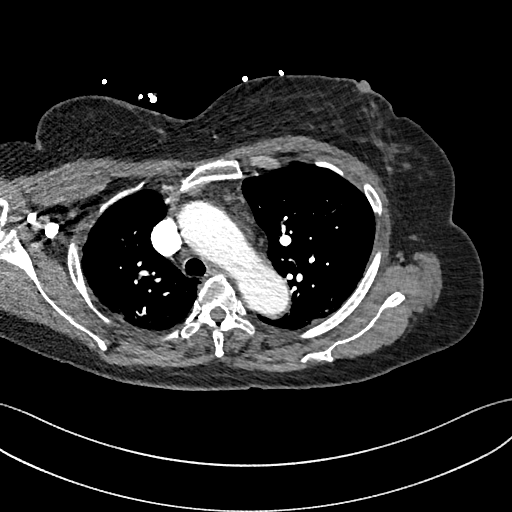
[im 160/219  lung]
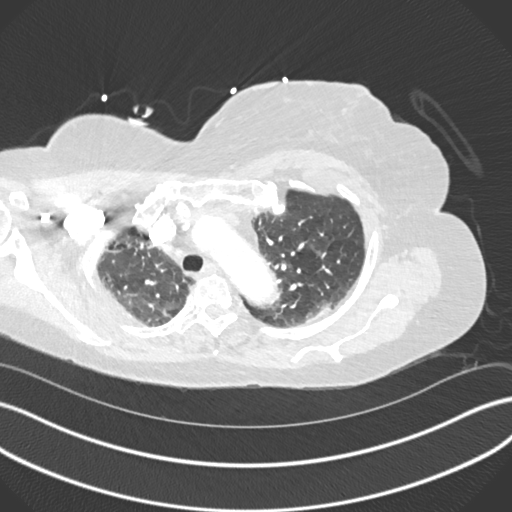
[im 175/219  soft-tissue]
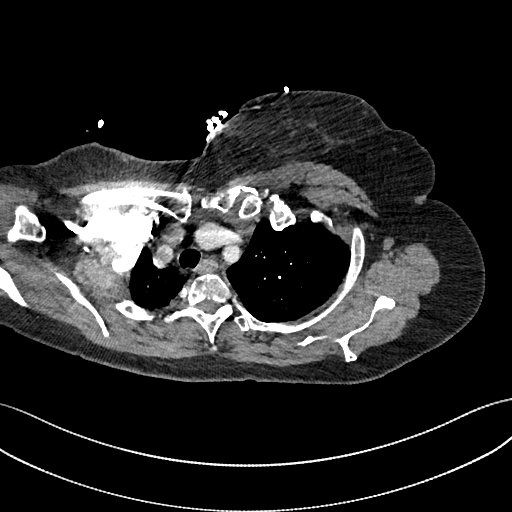
[im 189/219  lung]
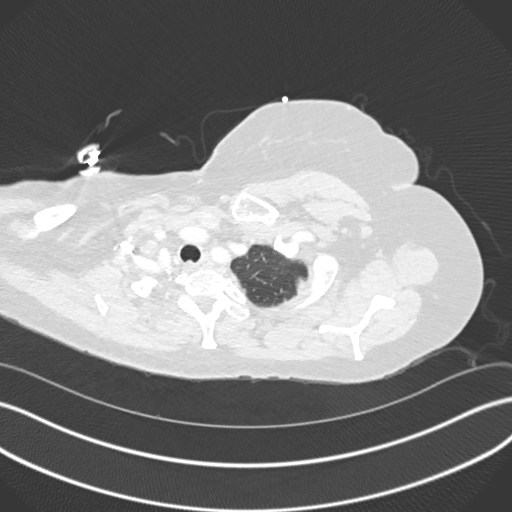
[im 204/219  soft-tissue]
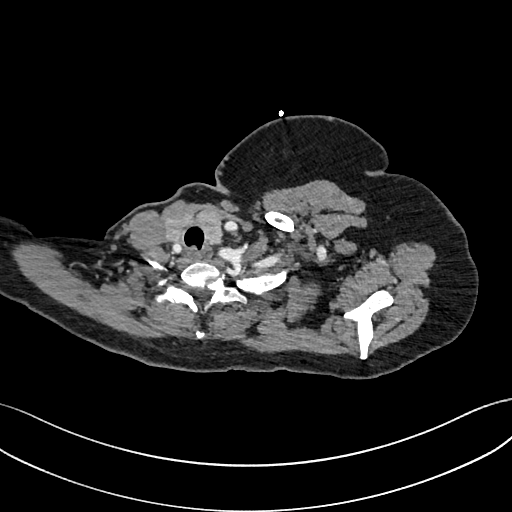

[Series 8: coronal mpr · coronal · 0.43mm/px · 1 of 95 slices shown]
[im 48/95  soft-tissue]
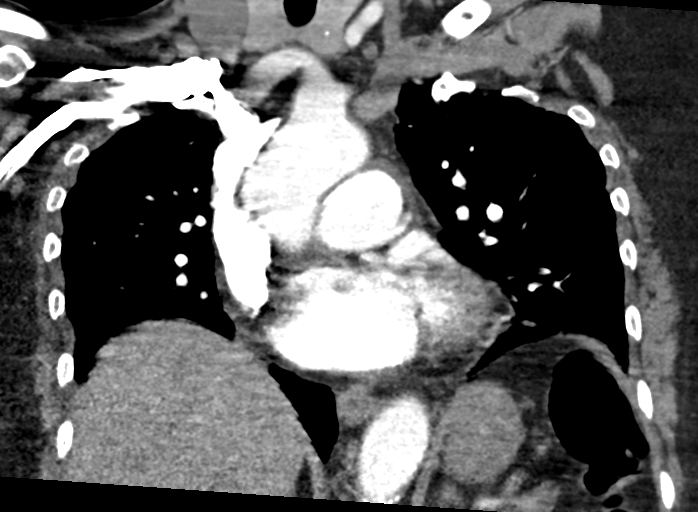

[Series 12: abd/ pelvis 5.0 i30f 1 · axial · 0.85mm/px · z∈[+919,+1084]mm · 3 of 99 slices shown]
[im 17/99  lung]
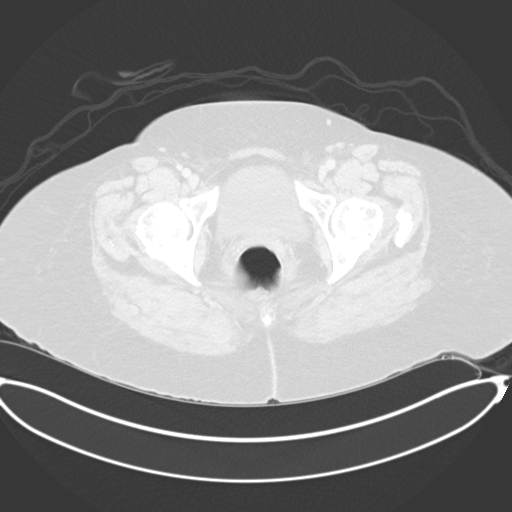
[im 33/99  lung]
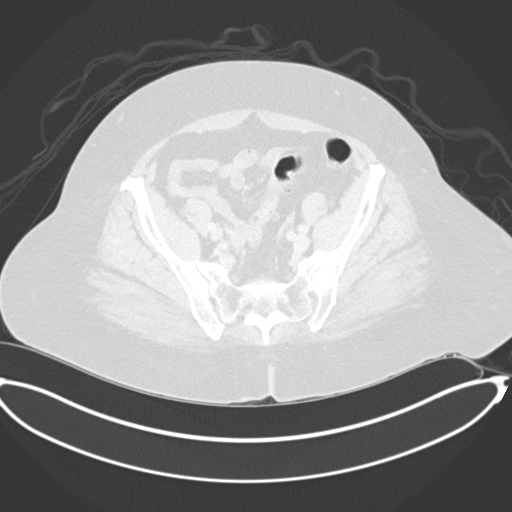
[im 50/99  lung]
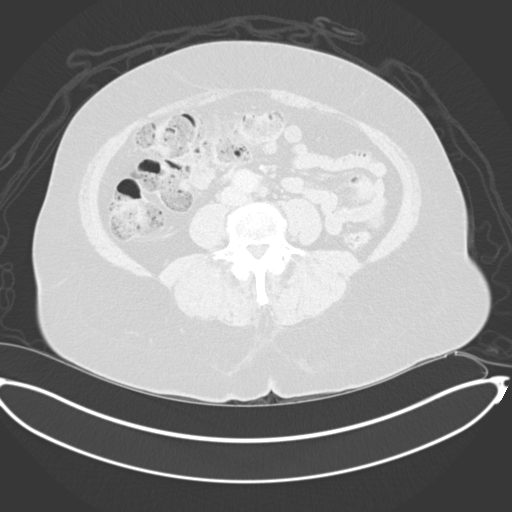

[18 of 46 positions shown; findings below may reference images not displayed]

FINDINGS: CTA CHEST FINDINGS

Cardiovascular: Contrast injection is sufficient to demonstrate
satisfactory opacification of the pulmonary arteries to the
segmental level. There is no pulmonary embolus. The main pulmonary
artery is within normal limits for size. There is no CT evidence of
acute right heart strain. No aortic dissection or other acute
abnormality. There is a normal variant aortic arch branching pattern
with the brachiocephalic and left common carotid arteries sharing a
common origin.. Heart size is normal, without pericardial effusion.

Mediastinum/Nodes: No mediastinal, hilar or axillary
lymphadenopathy. The visualized thyroid and thoracic esophageal
course are unremarkable.

Lungs/Pleura: No pulmonary nodules or masses. No pleural effusion or
pneumothorax. No focal airspace consolidation. No focal pleural
abnormality.

Review of the MIP images confirms the above findings.

CT ABDOMEN and PELVIS FINDINGS

Hepatobiliary: Normal hepatic size and contours. No perihepatic
ascites. No intra- or extrahepatic biliary dilatation. Normal
gallbladder.

Pancreas: Normal pancreatic contours. No peripancreatic fluid
collection or pancreatic ductal dilatation.

Spleen: Normal.

Adrenals/Urinary Tract: Normal adrenal glands. No hydronephrosis or
nephrolithiasis. No abnormal perinephric stranding. No ureteral
obstruction.

Stomach/Bowel: There is no hiatal hernia. The stomach and duodenum
are normal. There is no dilated small bowel or enteric inflammation.
There is no colonic abnormality. The appendix is normal.

Vascular/Lymphatic: There is atherosclerotic calcification of the
non aneurysmal abdominal aorta. No abdominal or pelvic adenopathy.

Reproductive: Normal uterus.  No adnexal mass.

Musculoskeletal: Multilevel lower lumbar facet arthrosis. Normal
visualized extrathoracic and extraperitoneal soft tissues.

Other: No contributory non-categorized findings.
IMPRESSION: 1. No pulmonary embolus or acute aortic syndrome.
2. No acute abnormality of the chest, abdomen or pelvis.
3. Aortic atherosclerosis.

## 2018-11-19 ENCOUNTER — Ambulatory Visit (INDEPENDENT_AMBULATORY_CARE_PROVIDER_SITE_OTHER): Payer: Medicare Other | Admitting: Obstetrics and Gynecology

## 2018-11-19 ENCOUNTER — Ambulatory Visit: Payer: Medicare Other | Admitting: Obstetrics and Gynecology

## 2018-11-19 ENCOUNTER — Encounter: Payer: Self-pay | Admitting: Obstetrics and Gynecology

## 2018-11-19 ENCOUNTER — Other Ambulatory Visit (HOSPITAL_COMMUNITY)
Admission: RE | Admit: 2018-11-19 | Discharge: 2018-11-19 | Disposition: A | Discharge status: Home / Self Care | Payer: Medicare Other | Source: Ambulatory Visit | Attending: Obstetrics and Gynecology | Admitting: Obstetrics and Gynecology

## 2018-11-19 VITALS — BP 154/105 | HR 112 | Temp 97.6°F | Wt 199.4 lb

## 2018-11-19 DIAGNOSIS — N898 Other specified noninflammatory disorders of vagina: Secondary | ICD-10-CM | POA: Insufficient documentation

## 2018-11-19 NOTE — Progress Notes (Signed)
Patient ID: LAMONI ALFREDO, female   DOB: 1951-05-19, 68 y.o.   MRN: [id number]  GYNECOLOGY PROGRESS NOTE  History:  68 y.o. presents to [facility] office today for problem gyn visit. She reports a vaginal odor that has not gone away; off an on since January 2019. She states that the "cream Walidah gave me last year works sometimes." She denies h/a, dizziness, shortness of breath, n/v, or fever/chills.    The following portions of the patient's history were reviewed and updated as appropriate: allergies, current medications, past family history, past medical history, past social history, past surgical history and problem list. Last pap smear in 2018 was normal, negative HRHPV.  Review of Systems:  Pertinent items are noted in HPI.   Objective:  Physical Exam Blood pressure (!) 154/105, pulse (!) 112, temperature 97.6 F (36.4 C), temperature source Oral, weight 199 lb 6.4 oz (90.4 kg). VS reviewed, nursing note reviewed  Constitutional: well developed, well nourished, no distress HEENT: normocephalic CV: normal rate Pulm/chest wall: normal effort Breast Exam: deferred Abdomen: soft Neuro: alert and oriented x 3 Skin: warm, dry Psych: affect normal Pelvic exam: Cervix pink, visually closed, without lesion, scant white creamy discharge, vaginal walls and external genitalia normal Bimanual exam: not done  Assessment & Plan:  1. Vaginal odor - Alternative Vaginitis Tx options given - Cervicovaginal ancillary only ([place]) - Advised will tx, if needed  100% of 15 min encounter spent discussing testing, alternative tx's and coordination of care.  Raelyn Mora, CNM 9:08 AM

## 2018-11-19 NOTE — Patient Instructions (Signed)
Alternative Vaginitis Therapies  1) soak in tub of warm water waist high with 1/2 cup of baking soda in water for ~ 20 mins. 2) soak 3 tampons in 1 tablespoon of fractionated (liquid form) coconut oil with 10 drops of Melaleuca (Tea Tree) essential oil, insert 1 saturated tampon vaginally at bedtime x 3 days. Both options are to be done after sexual intercourse, menses and when suspects BV (vaginal odor) and/or yeast infection. Advised that these alternatives will not replace the need to be evaluated, if sx's persist. You will need to seek care at an OB/GYN provider.  GO WHITE: Soap: UNSCENTED Dove (white box light green writing) Laundry detergent (underwear)- Dreft or Arm n[employer] Hammer unscented WHITE 100% cotton panties (NOT just cotton crouch) Sanitary napkin/panty liners: UNSCENTED.  If it doesn't SAY unscented it can have a scent/perfume    NO PERFUMES OR LOTIONS OR POTIONS in the vulvar area (may use regular [place]) Condoms: hypoallergenic only. Non dyed (no color) Toilet papers: white only Wash clothes: use a separate wash cloth. WHITE.  Wash in [place].    You can purchase Tea Tree Oil at:  [employer] [address] [place], [place] [address] [phone]  [employer] [address] [place], [place] [address] [phone]  [facility] [address] [place], [place] [address] [phone]

## 2018-11-21 ENCOUNTER — Emergency Department (HOSPITAL_COMMUNITY)
Admission: EM | Admit: 2018-11-21 | Discharge: 2018-11-21 | Disposition: A | Discharge status: Home / Self Care | Payer: Medicare Other | Attending: Emergency Medicine | Admitting: Emergency Medicine

## 2018-11-21 ENCOUNTER — Emergency Department (HOSPITAL_COMMUNITY): Payer: Medicare Other

## 2018-11-21 ENCOUNTER — Encounter (HOSPITAL_COMMUNITY): Payer: Self-pay | Admitting: Emergency Medicine

## 2018-11-21 DIAGNOSIS — Z79899 Other long term (current) drug therapy: Secondary | ICD-10-CM | POA: Diagnosis not present

## 2018-11-21 DIAGNOSIS — I129 Hypertensive chronic kidney disease with stage 1 through stage 4 chronic kidney disease, or unspecified chronic kidney disease: Secondary | ICD-10-CM | POA: Diagnosis not present

## 2018-11-21 DIAGNOSIS — Z7982 Long term (current) use of aspirin: Secondary | ICD-10-CM | POA: Diagnosis not present

## 2018-11-21 DIAGNOSIS — N183 Chronic kidney disease, stage 3 (moderate): Secondary | ICD-10-CM | POA: Insufficient documentation

## 2018-11-21 DIAGNOSIS — M791 Myalgia, unspecified site: Secondary | ICD-10-CM | POA: Diagnosis not present

## 2018-11-21 DIAGNOSIS — R52 Pain, unspecified: Secondary | ICD-10-CM | POA: Diagnosis present

## 2018-11-21 LAB — CBC
HCT: 39.7 % (ref 36.0–46.0)
Hemoglobin: 12.1 g/dL (ref 12.0–15.0)
MCH: 30.1 pg (ref 26.0–34.0)
MCHC: 30.5 g/dL (ref 30.0–36.0)
MCV: 98.8 fL (ref 80.0–100.0)
Platelets: 205 10*3/uL (ref 150–400)
RBC: 4.02 MIL/uL (ref 3.87–5.11)
RDW: 13.9 % (ref 11.5–15.5)
WBC: 7.8 10*3/uL (ref 4.0–10.5)
nRBC: 0 % (ref 0.0–0.2)

## 2018-11-21 LAB — COMPREHENSIVE METABOLIC PANEL
ALT: 18 U/L (ref 0–44)
AST: 23 U/L (ref 15–41)
Albumin: 3.8 g/dL (ref 3.5–5.0)
Alkaline Phosphatase: 93 U/L (ref 38–126)
Anion gap: 11 (ref 5–15)
BUN: 15 mg/dL (ref 8–23)
CO2: 21 mmol/L — ABNORMAL LOW (ref 22–32)
Calcium: 9.3 mg/dL (ref 8.9–10.3)
Chloride: 108 mmol/L (ref 98–111)
Creatinine, Ser: 1.24 mg/dL — ABNORMAL HIGH (ref 0.44–1.00)
GFR calc Af Amer: 53 mL/min — ABNORMAL LOW (ref >60)
GFR calc non Af Amer: 46 mL/min — ABNORMAL LOW (ref >60)
Glucose, Bld: 108 mg/dL — ABNORMAL HIGH (ref 70–99)
Potassium: 3.8 mmol/L (ref 3.5–5.1)
Sodium: 140 mmol/L (ref 135–145)
Total Bilirubin: 0.8 mg/dL (ref 0.3–1.2)
Total Protein: 7.6 g/dL (ref 6.5–8.1)

## 2018-11-21 MED ORDER — METHOCARBAMOL 1000 MG/10ML IJ SOLN
500.0000 mg | Freq: Once | INTRAMUSCULAR | Status: DC
  Filled 2018-11-21: qty 5

## 2018-11-21 MED ORDER — METHOCARBAMOL 1000 MG/10ML IJ SOLN
500.0000 mg | Freq: Once | INTRAVENOUS | Status: DC
  Filled 2018-11-21: qty 5

## 2018-11-21 MED ORDER — KETOROLAC TROMETHAMINE 30 MG/ML IJ SOLN
30.0000 mg | Freq: Once | INTRAMUSCULAR | Status: AC
  Administered 2018-11-21: 30 mg via INTRAVENOUS
  Filled 2018-11-21: qty 1

## 2018-11-21 MED ORDER — CYCLOBENZAPRINE HCL 10 MG PO TABS: 10.0000 mg | ORAL_TABLET | Freq: Two times a day (BID) | ORAL | 0 refills | Status: DC | PRN

## 2018-11-21 MED ORDER — SODIUM CHLORIDE 0.9 % IV BOLUS
1000.0000 mL | Freq: Once | INTRAVENOUS | Status: AC
  Administered 2018-11-21: 1000 mL via INTRAVENOUS

## 2018-11-21 NOTE — ED Notes (Signed)
Patient verbalizes understanding of discharge instructions. Opportunity for questioning and answers were provided. Armband removed by staff, pt discharged from ED.  

## 2018-11-21 NOTE — ED Triage Notes (Addendum)
States aching from shoulders ro ankles x 2 weeks no injury , took hydrocone this am  Then drove herself to hospital saw her dr last week. For same has f/u next moth

## 2018-11-21 NOTE — Discharge Instructions (Signed)
The strength today is fluids.  Take skeletal muscle relaxants as needed for pain.  Please follow-up with your primary care provider in the next week.  Return to the emergency department if you are worse especially if you start running a fever or have a change in your pain.

## 2018-11-21 NOTE — ED Provider Notes (Signed)
[facility] EMERGENCY [facility]PARTMENT Provider Note   CSN: [id number] Arrival date & time: 11/21/18  1241     History   Chief Complaint Chief Complaint  Patient presents with  . Generalized Body Aches    HPI Michelle MallingBarbara J Graves is a 68 y.o. female.  HPI  68 year old female [name] today complaining of pain from her shoulders down to her feet for 2 weeks.  Denies any injury, fever, chills, or previous symptoms.  States she was seen by her doctor and she took 2 hydrocodone today.  She states the pain is 10 out of 10.  She denies any headache or head injury.  She endorses some cough and dyspnea.  She states her wrists are getting smaller.  She reports that she had a 10 pound weight loss in 2 days but did not have any vomiting or diarrhea She states that she took Vicodin for pain.  She denies that it is helping.  She states pent pain continues at 10 out of 10.  She denies any other new medications.  She has been taking her blood pressure medications as prescribed Past Medical History:  Diagnosis Date  . Arthritis   . Chest pain   . Diverticulitis   . Hypertension     Patient Active Problem List   Diagnosis Date Noted  . Diverticulitis of intestine without perforation or abscess without bleeding   . Sinus tachycardia 08/27/2016  . Diverticulitis 08/26/2016  . HYPERTENSION, MALIGNANT ESSENTIAL 06/24/2007  . KIDNEY DISEASE, CHRONIC, STAGE III 06/24/2007  . ARTHRITIS, RHEUMATOID, SEROPOSITIVE 06/24/2007    Past Surgical History:  Procedure Laterality Date  . TONSILLECTOMY       OB History   No obstetric history on file.      Home Medications    Prior to Admission medications   Medication Sig Start Date End Date Taking? Authorizing Provider  amLODipine (NORVASC) 10 MG tablet Take 10 mg by mouth daily.    [provider]  aspirin EC 81 MG tablet Take 81 mg by mouth daily.    [provider]  calcium carbonate (OSCAL) 1500 (600 Ca) MG TABS  tablet Take 600 mg of elemental calcium by mouth daily.    [provider]  Carboxymethylcellul-Glycerin (CLEAR EYES FOR DRY EYES OP) Place 2 drops into both eyes daily as needed (dry eyes).    [provider]  cloNIDine (CATAPRES) 0.3 MG tablet Take 0.3 mg by mouth daily. 05/03/18   [provider]  gabapentin (NEURONTIN) 300 MG capsule Take 1 capsule (300 mg total) by mouth 3 (three) times daily. 03/10/18   Kerrin ChampagneNitka, James E, MD  hydrochlorothiazide (HYDRODIURIL) 25 MG tablet Take 1 tablet (25 mg total) by mouth daily. 04/22/18   Mesner, Astella CowerJason, MD  hydroxychloroquine (PLAQUENIL) 200 MG tablet Take 200 mg by mouth 2 (two) times daily.  06/16/16   [provider]  lisinopril (PRINIVIL,ZESTRIL) 20 MG tablet Take 20 mg by mouth daily.    [provider]  metoprolol tartrate (LOPRESSOR) 100 MG tablet Take 100 mg by mouth 2 (two) times daily.  11/19/15   [provider]  Misc Natural Products (OSTEO BI-FLEX JOINT SHIELD PO) Take 1 tablet by mouth daily.    [provider]  Multiple Vitamins-Minerals (CENTRUM SILVER 50+WOMEN PO) Take 1 tablet by mouth daily.    [provider]  naproxen (NAPROSYN) 500 MG tablet Take 500 mg by mouth daily.  03/15/18   [provider]  omeprazole (PRILOSEC) 20 MG  capsule Take 20 mg by mouth 2 (two) times daily. 06/16/16   [provider]  potassium chloride SA (K-DUR,KLOR-CON) 20 MEQ tablet Take 2 tablets (40 mEq total) by mouth daily. 04/22/18   Mesner, Zayra Cower, MD  predniSONE (DELTASONE) 20 MG tablet Take 1 tablet (20 mg total) by mouth daily. 05/19/18   Fayrene Helper, PA-C  terbinafine (LAMISIL) 250 MG tablet Take 250 mg by mouth daily. 03/15/18   [provider]  tiZANidine (ZANAFLEX) 2 MG tablet Take 4 mg by mouth at bedtime.     [provider]    Family History No family history on file.  Social History Social History   Tobacco Use  . Smoking status: Never Smoker  .  Smokeless tobacco: Never Used  Substance Use Topics  . Alcohol use: Yes    Alcohol/week: 1.0 standard drinks    Types: 1 Shots of liquor per week    Comment: sometimes   . Drug use: No     Allergies   Patient has no known allergies.   Review of Systems Review of Systems  All other systems reviewed and are negative.    Physical Exam Updated Vital Signs BP (!) 140/105   Pulse (!) 113   Temp 98.7 F (37.1 C) (Oral)   Resp 18   SpO2 99%   Physical Exam Vitals signs and nursing note reviewed. Exam conducted with a chaperone present.  Constitutional:      Appearance: Normal appearance. She is obese.  HENT:     Head: Normocephalic and atraumatic.     Right Ear: External ear normal.     Left Ear: External ear normal.     Nose: Nose normal.     Mouth/Throat:     Mouth: Mucous membranes are moist.     Pharynx: Oropharynx is clear.  Eyes:     Extraocular Movements: Extraocular movements intact.     Conjunctiva/sclera: Conjunctivae normal.     Pupils: Pupils are equal, round, and reactive to light.  Neck:     Musculoskeletal: Normal range of motion and neck supple.  Cardiovascular:     Rate and Rhythm: Normal rate and regular rhythm.  Pulmonary:     Effort: Pulmonary effort is normal.     Breath sounds: Normal breath sounds.  Abdominal:     General: Abdomen is flat.  Neurological:     Mental Status: She is alert.      ED Treatments / Results  Labs (all labs ordered are listed, but only abnormal results are displayed) Labs Reviewed  CBC  COMPREHENSIVE METABOLIC PANEL  URINALYSIS, ROUTINE W REFLEX MICROSCOPIC    EKG None  Radiology No results found.  Procedures Procedures (including critical care time)  Medications Ordered in ED Medications - No data to display   Initial Impression / Assessment and Plan / ED Course  I have reviewed the triage vital signs and the nursing notes.  Pertinent labs & imaging results that were available during my care  of the patient were reviewed by me and considered in my medical decision making (see chart for details).    68 year old female complaining of diffuse pain.  She received IV fluids and Robaxin here and states that she feels somewhat improved.  Labs were checked and she does not show any acute electrolyte abnormalities or renal failure.  White blood cell count and hemoglobin are normal.  I discussed results with patient.  I discussed return precautions and need for close follow-up and she is understanding.  Final Clinical Impressions(s) / ED Diagnoses   Final diagnoses:  Myalgia    ED Discharge Orders    None       Margarita Grizzleay, Kadelyn Dimascio, MD 11/21/18 [phone]

## 2018-11-21 NOTE — ED Notes (Signed)
Pt ambulated to bathroom without difficulty.  She was unable to provide urine at this time.

## 2018-11-21 NOTE — ED Notes (Signed)
Patient transported to X-ray 

## 2018-11-22 LAB — CERVICOVAGINAL ANCILLARY ONLY
Bacterial vaginitis: NEGATIVE
Candida vaginitis: NEGATIVE
Chlamydia: NEGATIVE
Neisseria Gonorrhea: NEGATIVE
Trichomonas: NEGATIVE

## 2018-12-24 ENCOUNTER — Other Ambulatory Visit: Payer: Self-pay | Admitting: Family Medicine

## 2018-12-24 DIAGNOSIS — Z13820 Encounter for screening for osteoporosis: Secondary | ICD-10-CM

## 2019-02-06 IMAGING — MG 2D DIGITAL SCREENING BILATERAL MAMMOGRAM WITH CAD AND ADJUNCT TO
8 of 12 series · 8 of 28 positions shown · non-contrast
Comparison: Previous exam(s).

CLINICAL DATA: Screening.

EXAM:
2D DIGITAL SCREENING BILATERAL MAMMOGRAM WITH CAD AND ADJUNCT TOMO

[L CC]
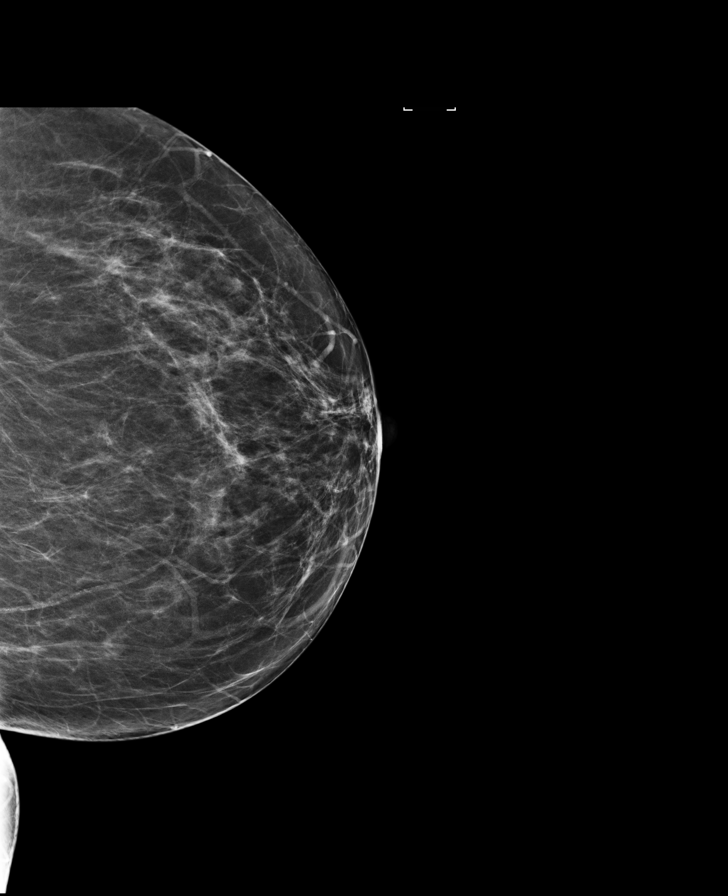

[R MLO]
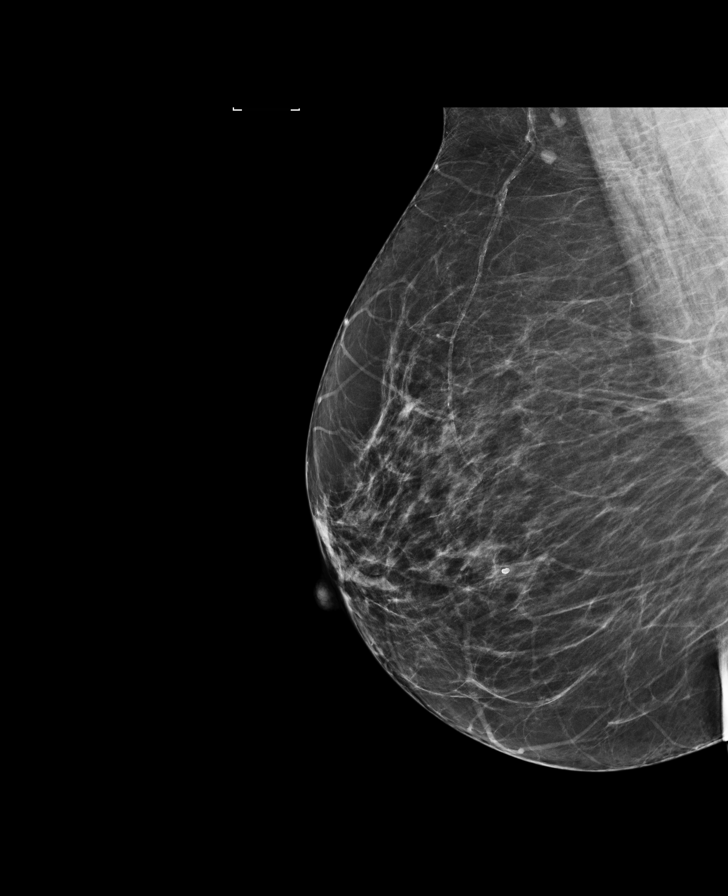

[R MLO synth-2D]
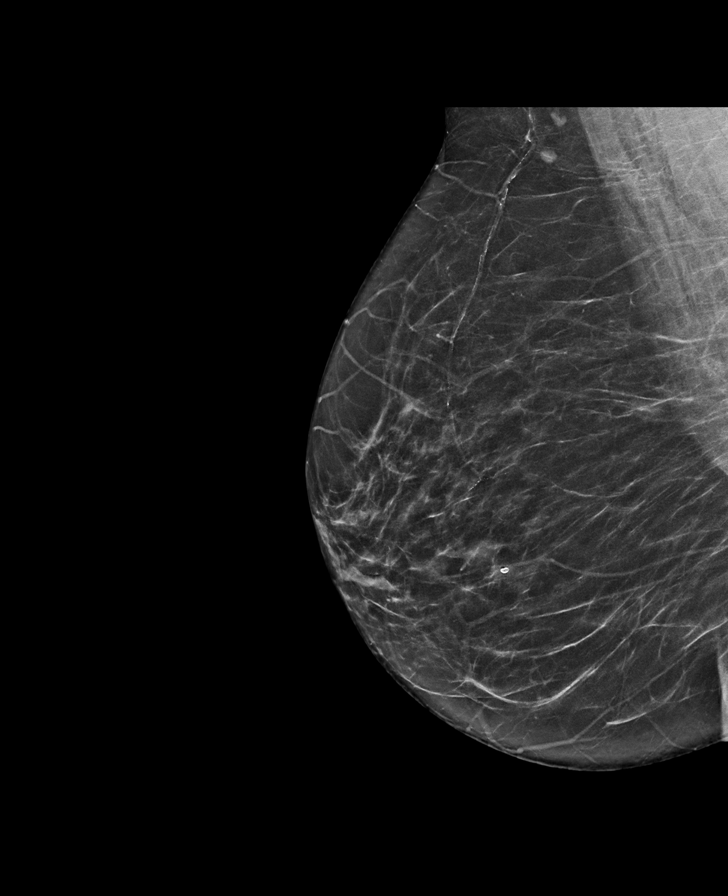

[L MLO]
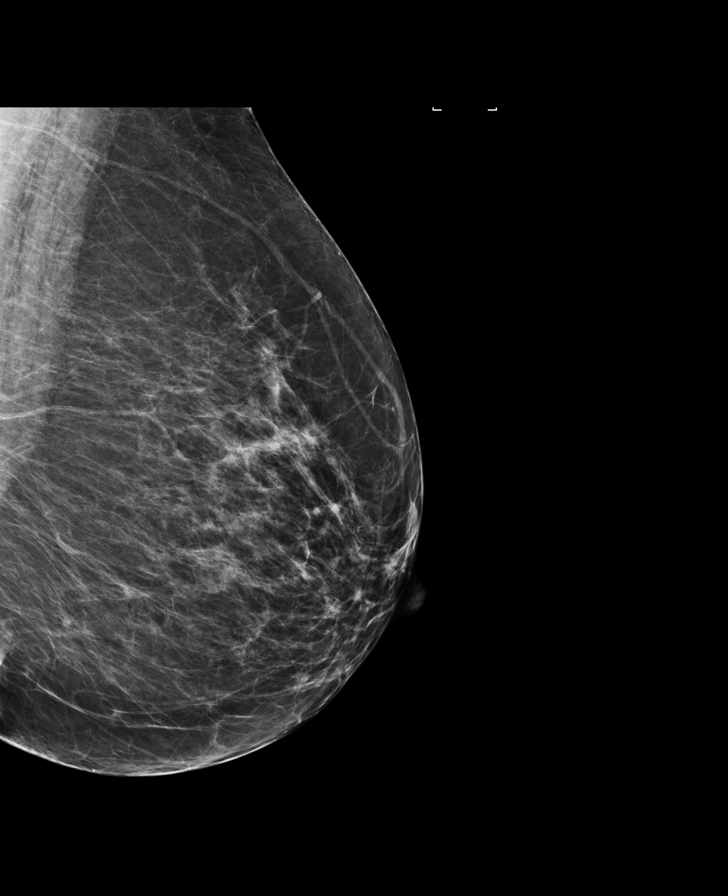

[R CC synth-2D]
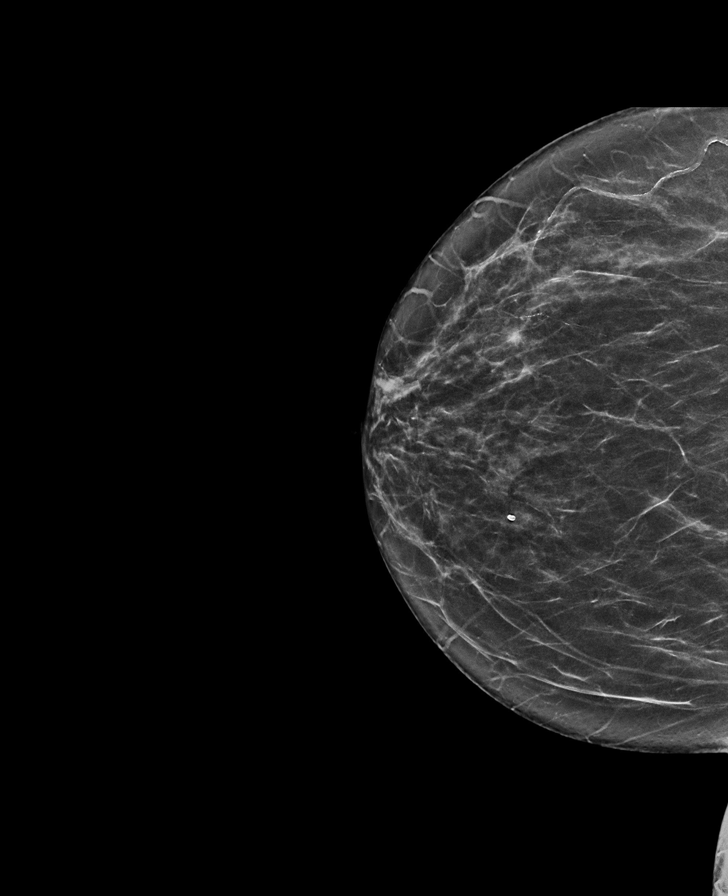

[L CC synth-2D]
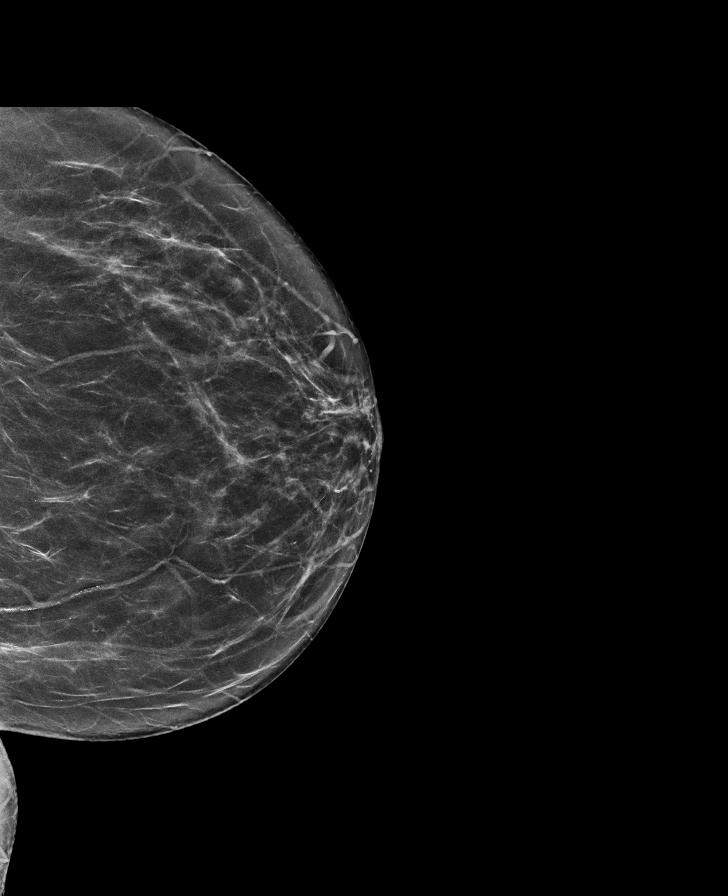

[R CC]
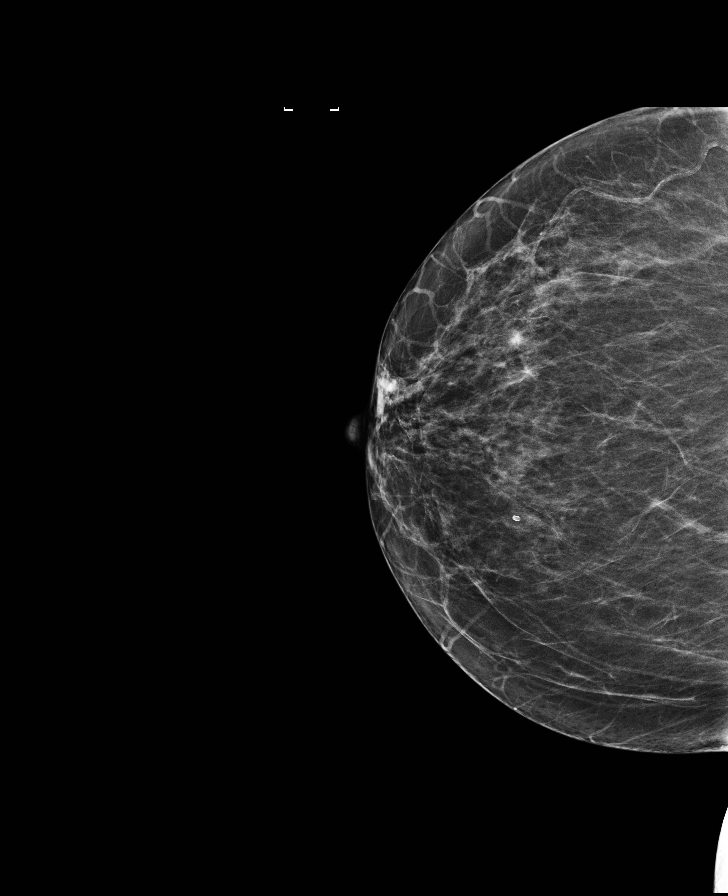

[L MLO synth-2D]
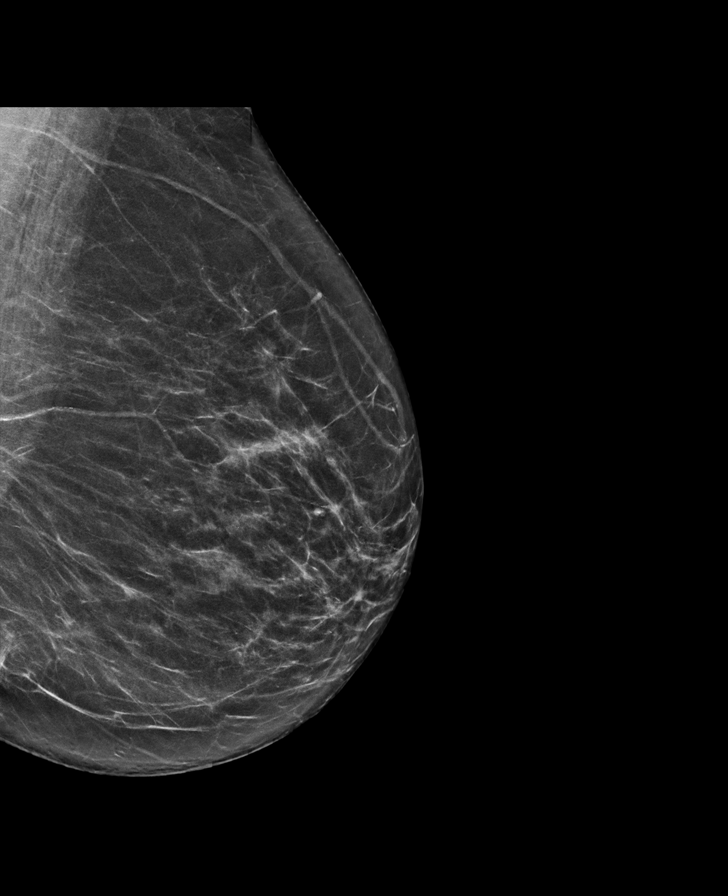

[8 of 28 positions shown; findings below may reference images not displayed]

ACR Breast Density Category b: There are scattered areas of
fibroglandular density.
FINDINGS: There are no findings suspicious for malignancy. Images were
processed with CAD.
IMPRESSION: No mammographic evidence of malignancy. A result letter of this
screening mammogram will be mailed directly to the patient.

RECOMMENDATION:
Screening mammogram in one year. (Code:97-6-RS4)

BI-RADS CATEGORY  1: Negative.

## 2019-02-28 ENCOUNTER — Other Ambulatory Visit: Payer: Medicare Other

## 2019-03-11 IMAGING — DX DG ABDOMEN 2V
2 series · 2 of 2 positions shown · non-contrast
Comparison: CT 03/19/2017

CLINICAL DATA: Lower abdominal pain, constipation

EXAM:
ABDOMEN - 2 VIEW

[dg abd 2 views (1 of 2)]
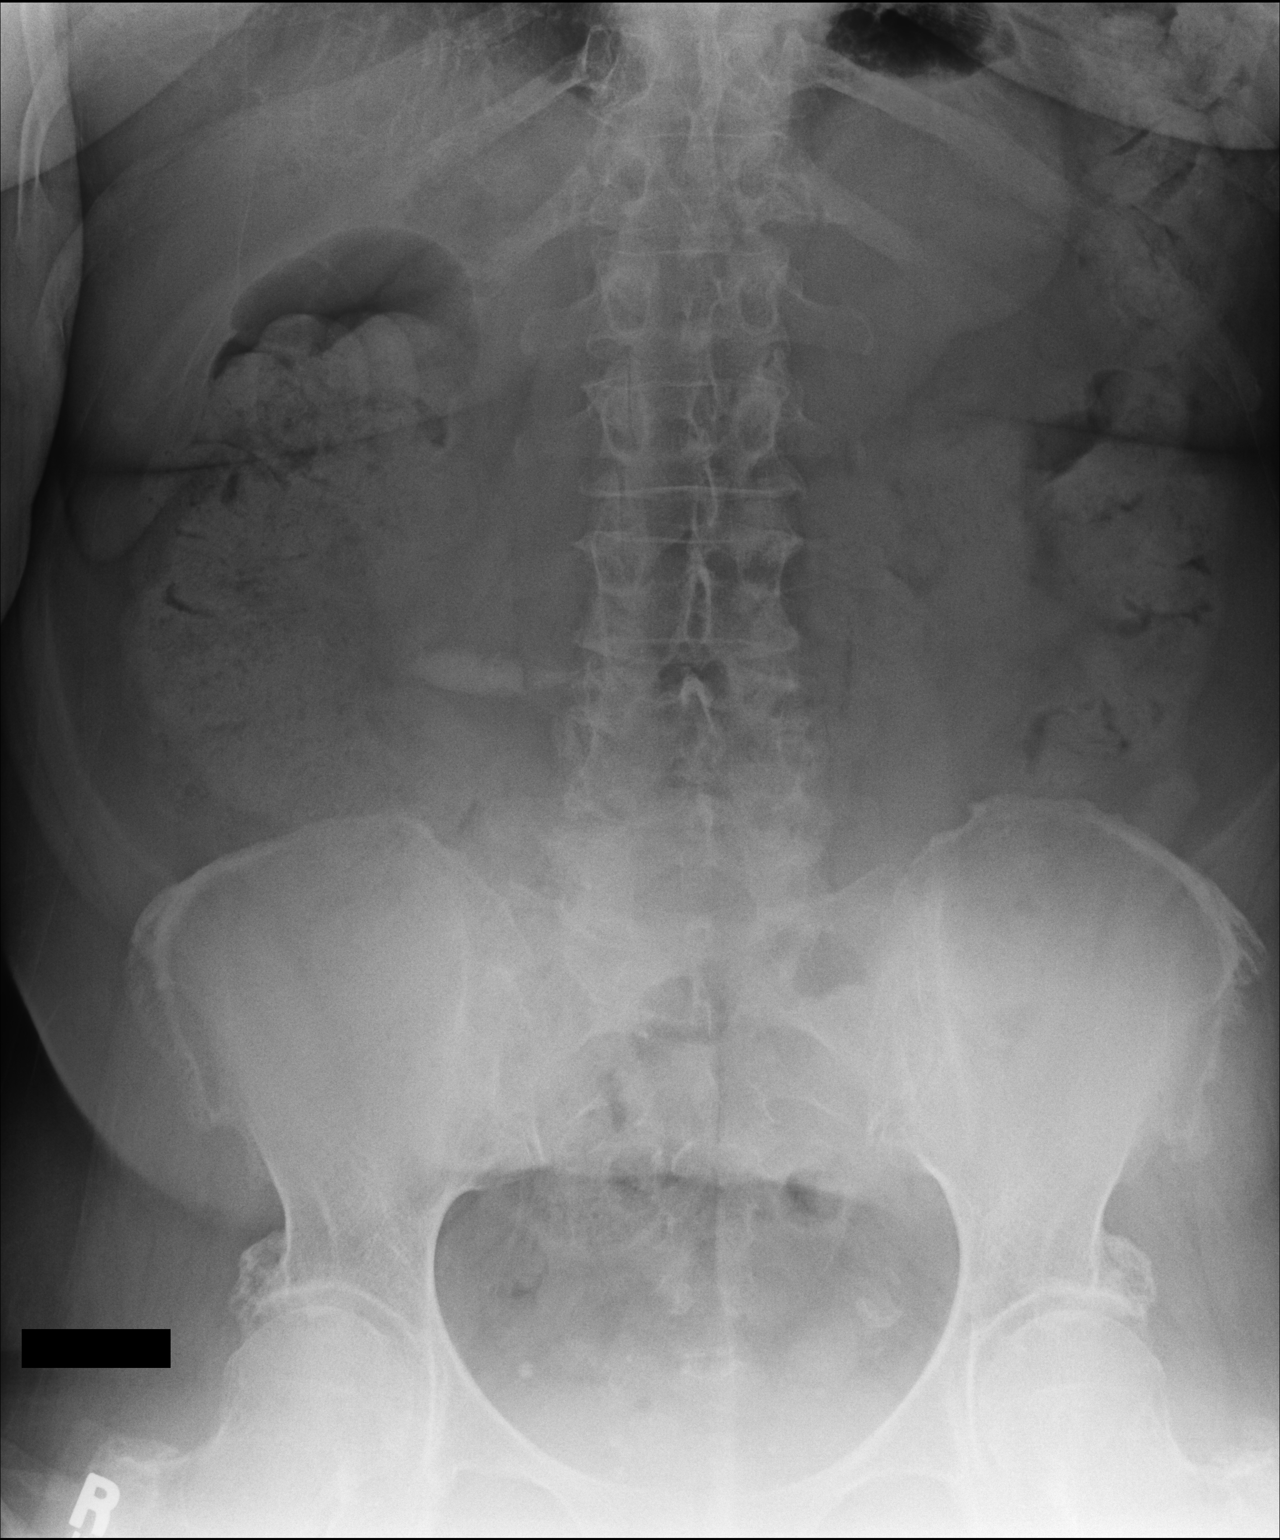

[dg abd 2 views (2 of 2)]
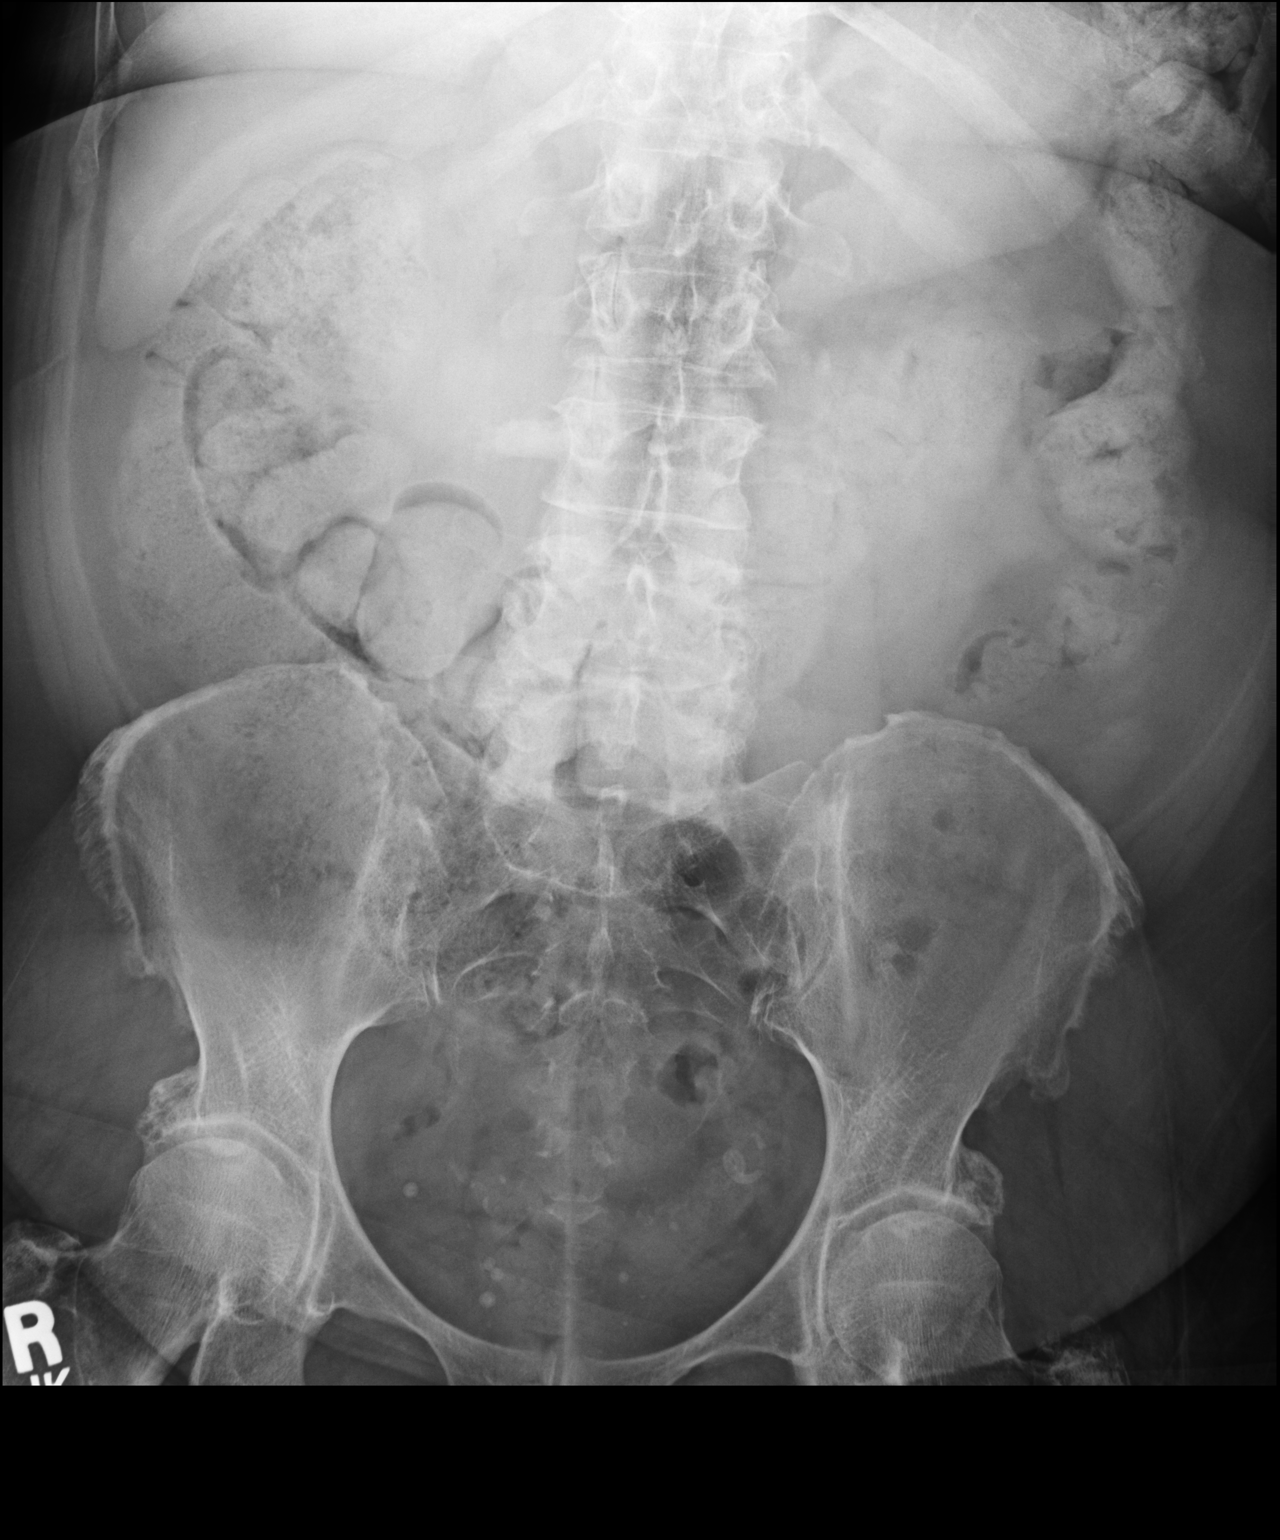

[2 of 2 positions shown; findings below may reference images not displayed]

FINDINGS: Large stool burden throughout the colon. No free air organomegaly.
Calcified phleboliths in the pelvis. No acute bony abnormality.
Degenerative changes in the hips.
IMPRESSION: Large stool burden.  No acute findings.

## 2019-03-24 ENCOUNTER — Emergency Department (HOSPITAL_COMMUNITY): Payer: Medicare Other

## 2019-03-24 ENCOUNTER — Inpatient Hospital Stay (HOSPITAL_COMMUNITY)
Admission: EM | Admit: 2019-03-24 | Discharge: 2019-04-04 | DRG: 871 | Disposition: A | Discharge status: Skilled Nursing Facility | Payer: Medicare Other | Attending: Internal Medicine | Admitting: Internal Medicine

## 2019-03-24 ENCOUNTER — Inpatient Hospital Stay (HOSPITAL_COMMUNITY): Payer: Medicare Other

## 2019-03-24 ENCOUNTER — Encounter (HOSPITAL_COMMUNITY): Payer: Self-pay

## 2019-03-24 ENCOUNTER — Inpatient Hospital Stay: Payer: Self-pay

## 2019-03-24 DIAGNOSIS — I10 Essential (primary) hypertension: Secondary | ICD-10-CM | POA: Diagnosis not present

## 2019-03-24 DIAGNOSIS — R29702 NIHSS score 2: Secondary | ICD-10-CM | POA: Diagnosis not present

## 2019-03-24 DIAGNOSIS — I639 Cerebral infarction, unspecified: Secondary | ICD-10-CM | POA: Diagnosis not present

## 2019-03-24 DIAGNOSIS — G8929 Other chronic pain: Secondary | ICD-10-CM | POA: Diagnosis present

## 2019-03-24 DIAGNOSIS — Z7982 Long term (current) use of aspirin: Secondary | ICD-10-CM | POA: Diagnosis not present

## 2019-03-24 DIAGNOSIS — D62 Acute posthemorrhagic anemia: Secondary | ICD-10-CM | POA: Diagnosis present

## 2019-03-24 DIAGNOSIS — M069 Rheumatoid arthritis, unspecified: Secondary | ICD-10-CM | POA: Diagnosis present

## 2019-03-24 DIAGNOSIS — I77810 Thoracic aortic ectasia: Secondary | ICD-10-CM | POA: Diagnosis present

## 2019-03-24 DIAGNOSIS — I248 Other forms of acute ischemic heart disease: Secondary | ICD-10-CM | POA: Diagnosis not present

## 2019-03-24 DIAGNOSIS — M3500 Sicca syndrome, unspecified: Secondary | ICD-10-CM | POA: Diagnosis present

## 2019-03-24 DIAGNOSIS — K5731 Diverticulosis of large intestine without perforation or abscess with bleeding: Secondary | ICD-10-CM | POA: Diagnosis present

## 2019-03-24 DIAGNOSIS — Z6834 Body mass index (BMI) 34.0-34.9, adult: Secondary | ICD-10-CM

## 2019-03-24 DIAGNOSIS — J9601 Acute respiratory failure with hypoxia: Secondary | ICD-10-CM | POA: Diagnosis present

## 2019-03-24 DIAGNOSIS — G9341 Metabolic encephalopathy: Secondary | ICD-10-CM | POA: Diagnosis not present

## 2019-03-24 DIAGNOSIS — K559 Vascular disorder of intestine, unspecified: Secondary | ICD-10-CM | POA: Diagnosis present

## 2019-03-24 DIAGNOSIS — E785 Hyperlipidemia, unspecified: Secondary | ICD-10-CM | POA: Diagnosis present

## 2019-03-24 DIAGNOSIS — I129 Hypertensive chronic kidney disease with stage 1 through stage 4 chronic kidney disease, or unspecified chronic kidney disease: Secondary | ICD-10-CM | POA: Diagnosis present

## 2019-03-24 DIAGNOSIS — I6330 Cerebral infarction due to thrombosis of unspecified cerebral artery: Secondary | ICD-10-CM | POA: Diagnosis not present

## 2019-03-24 DIAGNOSIS — X58XXXA Exposure to other specified factors, initial encounter: Secondary | ICD-10-CM | POA: Diagnosis present

## 2019-03-24 DIAGNOSIS — N179 Acute kidney failure, unspecified: Secondary | ICD-10-CM

## 2019-03-24 DIAGNOSIS — R195 Other fecal abnormalities: Secondary | ICD-10-CM | POA: Diagnosis not present

## 2019-03-24 DIAGNOSIS — I4891 Unspecified atrial fibrillation: Secondary | ICD-10-CM | POA: Diagnosis not present

## 2019-03-24 DIAGNOSIS — A419 Sepsis, unspecified organism: Secondary | ICD-10-CM | POA: Diagnosis present

## 2019-03-24 DIAGNOSIS — I959 Hypotension, unspecified: Secondary | ICD-10-CM

## 2019-03-24 DIAGNOSIS — I361 Nonrheumatic tricuspid (valve) insufficiency: Secondary | ICD-10-CM | POA: Diagnosis not present

## 2019-03-24 DIAGNOSIS — R571 Hypovolemic shock: Secondary | ICD-10-CM | POA: Diagnosis present

## 2019-03-24 DIAGNOSIS — J90 Pleural effusion, not elsewhere classified: Secondary | ICD-10-CM | POA: Diagnosis present

## 2019-03-24 DIAGNOSIS — Z66 Do not resuscitate: Secondary | ICD-10-CM | POA: Diagnosis present

## 2019-03-24 DIAGNOSIS — R410 Disorientation, unspecified: Secondary | ICD-10-CM | POA: Diagnosis not present

## 2019-03-24 DIAGNOSIS — I6389 Other cerebral infarction: Secondary | ICD-10-CM | POA: Diagnosis not present

## 2019-03-24 DIAGNOSIS — R112 Nausea with vomiting, unspecified: Secondary | ICD-10-CM

## 2019-03-24 DIAGNOSIS — Z79899 Other long term (current) drug therapy: Secondary | ICD-10-CM

## 2019-03-24 DIAGNOSIS — Z1159 Encounter for screening for other viral diseases: Secondary | ICD-10-CM

## 2019-03-24 DIAGNOSIS — K222 Esophageal obstruction: Secondary | ICD-10-CM | POA: Diagnosis present

## 2019-03-24 DIAGNOSIS — E669 Obesity, unspecified: Secondary | ICD-10-CM | POA: Diagnosis present

## 2019-03-24 DIAGNOSIS — Z8249 Family history of ischemic heart disease and other diseases of the circulatory system: Secondary | ICD-10-CM

## 2019-03-24 DIAGNOSIS — E876 Hypokalemia: Secondary | ICD-10-CM | POA: Diagnosis not present

## 2019-03-24 DIAGNOSIS — J9811 Atelectasis: Secondary | ICD-10-CM | POA: Diagnosis present

## 2019-03-24 DIAGNOSIS — Z7952 Long term (current) use of systemic steroids: Secondary | ICD-10-CM

## 2019-03-24 DIAGNOSIS — N183 Chronic kidney disease, stage 3 unspecified: Secondary | ICD-10-CM | POA: Diagnosis present

## 2019-03-24 DIAGNOSIS — K920 Hematemesis: Secondary | ICD-10-CM | POA: Diagnosis not present

## 2019-03-24 DIAGNOSIS — R109 Unspecified abdominal pain: Secondary | ICD-10-CM

## 2019-03-24 DIAGNOSIS — K529 Noninfective gastroenteritis and colitis, unspecified: Secondary | ICD-10-CM | POA: Diagnosis not present

## 2019-03-24 DIAGNOSIS — I471 Supraventricular tachycardia: Secondary | ICD-10-CM | POA: Diagnosis not present

## 2019-03-24 DIAGNOSIS — T39395A Adverse effect of other nonsteroidal anti-inflammatory drugs [NSAID], initial encounter: Secondary | ICD-10-CM | POA: Diagnosis present

## 2019-03-24 DIAGNOSIS — Z452 Encounter for adjustment and management of vascular access device: Secondary | ICD-10-CM

## 2019-03-24 DIAGNOSIS — K449 Diaphragmatic hernia without obstruction or gangrene: Secondary | ICD-10-CM | POA: Diagnosis present

## 2019-03-24 DIAGNOSIS — I9589 Other hypotension: Secondary | ICD-10-CM | POA: Diagnosis present

## 2019-03-24 DIAGNOSIS — I080 Rheumatic disorders of both mitral and aortic valves: Secondary | ICD-10-CM | POA: Diagnosis present

## 2019-03-24 DIAGNOSIS — I340 Nonrheumatic mitral (valve) insufficiency: Secondary | ICD-10-CM | POA: Diagnosis not present

## 2019-03-24 DIAGNOSIS — Z791 Long term (current) use of non-steroidal anti-inflammatories (NSAID): Secondary | ICD-10-CM

## 2019-03-24 DIAGNOSIS — K259 Gastric ulcer, unspecified as acute or chronic, without hemorrhage or perforation: Secondary | ICD-10-CM | POA: Diagnosis present

## 2019-03-24 DIAGNOSIS — E861 Hypovolemia: Secondary | ICD-10-CM

## 2019-03-24 DIAGNOSIS — N189 Chronic kidney disease, unspecified: Secondary | ICD-10-CM | POA: Diagnosis not present

## 2019-03-24 DIAGNOSIS — I2510 Atherosclerotic heart disease of native coronary artery without angina pectoris: Secondary | ICD-10-CM | POA: Diagnosis present

## 2019-03-24 DIAGNOSIS — R0602 Shortness of breath: Secondary | ICD-10-CM

## 2019-03-24 DIAGNOSIS — Z833 Family history of diabetes mellitus: Secondary | ICD-10-CM

## 2019-03-24 DIAGNOSIS — D696 Thrombocytopenia, unspecified: Secondary | ICD-10-CM | POA: Diagnosis present

## 2019-03-24 DIAGNOSIS — F329 Major depressive disorder, single episode, unspecified: Secondary | ICD-10-CM | POA: Diagnosis present

## 2019-03-24 DIAGNOSIS — E8770 Fluid overload, unspecified: Secondary | ICD-10-CM | POA: Diagnosis present

## 2019-03-24 LAB — ABO/RH: ABO/RH(D): O POS

## 2019-03-24 LAB — TYPE AND SCREEN
ABO/RH(D): O POS
Antibody Screen: NEGATIVE

## 2019-03-24 LAB — HEPATIC FUNCTION PANEL
ALT: 20 U/L (ref 0–44)
AST: 35 U/L (ref 15–41)
Albumin: 4.4 g/dL (ref 3.5–5.0)
Alkaline Phosphatase: 93 U/L (ref 38–126)
Bilirubin, Direct: 0.3 mg/dL — ABNORMAL HIGH (ref 0.0–0.2)
Indirect Bilirubin: 1.3 mg/dL — ABNORMAL HIGH (ref 0.3–0.9)
Total Bilirubin: 1.6 mg/dL — ABNORMAL HIGH (ref 0.3–1.2)
Total Protein: 8.8 g/dL — ABNORMAL HIGH (ref 6.5–8.1)

## 2019-03-24 LAB — CBC WITH DIFFERENTIAL/PLATELET
Abs Immature Granulocytes: 0.08 10*3/uL — ABNORMAL HIGH (ref 0.00–0.07)
Basophils Absolute: 0 10*3/uL (ref 0.0–0.1)
Basophils Relative: 0 %
Eosinophils Absolute: 0 10*3/uL (ref 0.0–0.5)
Eosinophils Relative: 0 %
HCT: 46.2 % — ABNORMAL HIGH (ref 36.0–46.0)
Hemoglobin: 14.6 g/dL (ref 12.0–15.0)
Immature Granulocytes: 1 %
Lymphocytes Relative: 7 %
Lymphs Abs: 0.8 10*3/uL (ref 0.7–4.0)
MCH: 31 pg (ref 26.0–34.0)
MCHC: 31.6 g/dL (ref 30.0–36.0)
MCV: 98.1 fL (ref 80.0–100.0)
Monocytes Absolute: 0.5 10*3/uL (ref 0.1–1.0)
Monocytes Relative: 5 %
Neutro Abs: 10.2 10*3/uL — ABNORMAL HIGH (ref 1.7–7.7)
Neutrophils Relative %: 87 %
Platelets: 200 10*3/uL (ref 150–400)
RBC: 4.71 MIL/uL (ref 3.87–5.11)
RDW: 14.2 % (ref 11.5–15.5)
WBC: 11.6 10*3/uL — ABNORMAL HIGH (ref 4.0–10.5)
nRBC: 0 % (ref 0.0–0.2)

## 2019-03-24 LAB — BASIC METABOLIC PANEL
Anion gap: 23 — ABNORMAL HIGH (ref 5–15)
BUN: 80 mg/dL — ABNORMAL HIGH (ref 8–23)
CO2: 21 mmol/L — ABNORMAL LOW (ref 22–32)
Calcium: 9.4 mg/dL (ref 8.9–10.3)
Chloride: 95 mmol/L — ABNORMAL LOW (ref 98–111)
Creatinine, Ser: 5.04 mg/dL — ABNORMAL HIGH (ref 0.44–1.00)
GFR calc Af Amer: 10 mL/min — ABNORMAL LOW (ref >60)
GFR calc non Af Amer: 8 mL/min — ABNORMAL LOW (ref >60)
Glucose, Bld: 165 mg/dL — ABNORMAL HIGH (ref 70–99)
Potassium: 4.4 mmol/L (ref 3.5–5.1)
Sodium: 139 mmol/L (ref 135–145)

## 2019-03-24 LAB — PROTIME-INR
INR: 1 (ref 0.8–1.2)
Prothrombin Time: 13.5 seconds (ref 11.4–15.2)

## 2019-03-24 LAB — SODIUM, URINE, RANDOM: Sodium, Ur: 48 mmol/L

## 2019-03-24 LAB — CREATININE, URINE, RANDOM: Creatinine, Urine: 191.64 mg/dL

## 2019-03-24 LAB — LACTIC ACID, PLASMA
Lactic Acid, Venous: 6.6 mmol/L (ref 0.5–1.9)
Lactic Acid, Venous: 5.8 mmol/L (ref 0.5–1.9)

## 2019-03-24 LAB — SARS CORONAVIRUS 2 BY RT PCR (HOSPITAL ORDER, PERFORMED IN ~~LOC~~ HOSPITAL LAB): SARS Coronavirus 2: NEGATIVE

## 2019-03-24 LAB — MRSA PCR SCREENING: MRSA by PCR: NEGATIVE

## 2019-03-24 LAB — POC OCCULT BLOOD, ED: Fecal Occult Bld: POSITIVE — AB

## 2019-03-24 LAB — CBG MONITORING, ED: Glucose-Capillary: 158 mg/dL — ABNORMAL HIGH (ref 70–99)

## 2019-03-24 MED ORDER — ACETAMINOPHEN 325 MG PO TABS
650.0000 mg | ORAL_TABLET | Freq: Four times a day (QID) | ORAL | Status: DC | PRN
  Administered 2019-04-02: 650 mg via ORAL
  Filled 2019-03-24: qty 2

## 2019-03-24 MED ORDER — PANTOPRAZOLE SODIUM 40 MG IV SOLR
40.0000 mg | Freq: Once | INTRAVENOUS | Status: AC
  Administered 2019-03-24: 40 mg via INTRAVENOUS
  Filled 2019-03-24: qty 40

## 2019-03-24 MED ORDER — ONDANSETRON HCL 4 MG/2ML IJ SOLN
4.0000 mg | Freq: Four times a day (QID) | INTRAMUSCULAR | Status: DC | PRN
  Administered 2019-03-25 – 2019-03-30 (×4): 4 mg via INTRAVENOUS
  Filled 2019-03-24 (×4): qty 2

## 2019-03-24 MED ORDER — ONDANSETRON HCL 4 MG PO TABS: 4.0000 mg | ORAL_TABLET | Freq: Four times a day (QID) | ORAL | Status: DC | PRN

## 2019-03-24 MED ORDER — ACETAMINOPHEN 650 MG RE SUPP: 650.0000 mg | Freq: Four times a day (QID) | RECTAL | Status: DC | PRN

## 2019-03-24 MED ORDER — SODIUM CHLORIDE 0.9 % IV BOLUS
500.0000 mL | Freq: Once | INTRAVENOUS | Status: AC
  Administered 2019-03-24: 500 mL via INTRAVENOUS

## 2019-03-24 MED ORDER — SODIUM CHLORIDE 0.45 % IV SOLN
INTRAVENOUS | Status: DC
  Administered 2019-03-24 – 2019-03-25 (×2): via INTRAVENOUS

## 2019-03-24 MED ORDER — TRAZODONE HCL 50 MG PO TABS
100.0000 mg | ORAL_TABLET | Freq: Every day | ORAL | Status: DC
  Administered 2019-03-24: 100 mg via ORAL
  Filled 2019-03-24: qty 2

## 2019-03-24 MED ORDER — PANTOPRAZOLE SODIUM 40 MG IV SOLR
40.0000 mg | Freq: Two times a day (BID) | INTRAVENOUS | Status: DC
  Administered 2019-03-24 – 2019-03-29 (×10): 40 mg via INTRAVENOUS
  Filled 2019-03-24 (×10): qty 40

## 2019-03-24 MED ORDER — MORPHINE SULFATE (PF) 2 MG/ML IV SOLN
2.0000 mg | Freq: Once | INTRAVENOUS | Status: AC
  Administered 2019-03-24: 2 mg via INTRAVENOUS
  Filled 2019-03-24: qty 1

## 2019-03-24 MED ORDER — SODIUM CHLORIDE 0.9 % IV BOLUS
1000.0000 mL | Freq: Once | INTRAVENOUS | Status: AC
  Administered 2019-03-24: 1000 mL via INTRAVENOUS

## 2019-03-24 MED ORDER — LIP MEDEX EX OINT
TOPICAL_OINTMENT | CUTANEOUS | Status: AC
  Administered 2019-03-25: 01:00:00
  Filled 2019-03-24: qty 7

## 2019-03-24 MED ORDER — SODIUM CHLORIDE 0.9 % IV BOLUS
2000.0000 mL | Freq: Once | INTRAVENOUS | Status: AC
  Administered 2019-03-24: 2000 mL via INTRAVENOUS

## 2019-03-24 MED ORDER — HYDROCODONE-ACETAMINOPHEN 10-325 MG PO TABS
1.0000 | ORAL_TABLET | Freq: Four times a day (QID) | ORAL | Status: DC | PRN
  Administered 2019-03-25 – 2019-03-29 (×3): 1 via ORAL
  Filled 2019-03-24 (×3): qty 1

## 2019-03-24 MED ORDER — HYDROXYCHLOROQUINE SULFATE 200 MG PO TABS
200.0000 mg | ORAL_TABLET | Freq: Two times a day (BID) | ORAL | Status: DC
  Administered 2019-03-24 – 2019-04-04 (×17): 200 mg via ORAL
  Filled 2019-03-24 (×18): qty 1

## 2019-03-24 NOTE — ED Notes (Signed)
Renal Ultrasound at bedside 

## 2019-03-24 NOTE — ED Notes (Signed)
ED TO INPATIENT HANDOFF REPORT  Name/Age/Gender [name] 68 y.o. female  Code Status Code Status History    Date Active Date Inactive Code Status Order ID Comments User Context   08/27/2016 0249 08/30/2016 1824 Full Code [id number]  Michael Litter, MD ED      Home/SNF/Other Home  Chief Complaint abdominal pain hypotensive  Level of Care/Admitting Diagnosis ED Disposition    ED Disposition Condition Comment   Admit  Hospital Area: [facility] [[id number]]  Level of Care: Stepdown [14]  Admit to SDU based on following criteria: Hemodynamic compromise or significant risk of instability:  Patient requiring short term acute titration and management of vasoactive drips, and invasive monitoring (i.e., CVP and Arterial line).  Admit to SDU based on following criteria: Severe physiological/psychological symptoms:  Any diagnosis requiring assessment & intervention at least every 4 hours on an ongoing basis to obtain desired patient outcomes including stability and rehabilitation  Covid Evaluation: Confirmed COVID Negative  Diagnosis: Acute kidney injury superimposed on CKD [facility]) [[id number]]  Admitting Physician: Narda Bonds [phone]  Attending Physician: Narda Bonds [phone]  Estimated length of stay: past midnight tomorrow  Certification:: I certify this patient will need inpatient services for at least 2 midnights  PT Class (Do Not Modify): Inpatient [101]  PT Acc Code (Do Not Modify): Private [1]       Medical History Past Medical History:  Diagnosis Date  . Arthritis   . Chest pain   . Diverticulitis   . Hypertension     Allergies No Known Allergies  IV Location/Drains/Wounds Patient Lines/Drains/Airways Status   Active Line/Drains/Airways    Name:   Placement date:   Placement time:   Site:   Days:   Peripheral IV 03/24/19 Right Forearm   03/24/19    1443    Forearm   less than 1   Peripheral IV 03/24/19 Left Hand   03/24/19    1730    Hand    less than 1          Labs/Imaging Results for orders placed or performed during the hospital encounter of 03/24/19 (from the past 48 hour(s))  CBG monitoring, ED     Status: Abnormal   Collection Time: 03/24/19 12:48 PM  Result Value Ref Range   Glucose-Capillary 158 (H) 70 - 99 mg/dL  Type and screen     Status: None   Collection Time: 03/24/19  1:05 PM  Result Value Ref Range   ABO/RH(D) O POS    Antibody Screen NEG    Sample Expiration      03/27/2019,2359 Performed at [facility], [address], [place], [place] [address]   Basic metabolic panel     Status: Abnormal   Collection Time: 03/24/19  1:09 PM  Result Value Ref Range   Sodium 139 135 - 145 mmol/L    Comment: REPEATED TO VERIFY   Potassium 4.4 3.5 - 5.1 mmol/L   Chloride 95 (L) 98 - 111 mmol/L    Comment: REPEATED TO VERIFY   CO2 21 (L) 22 - 32 mmol/L    Comment: REPEATED TO VERIFY   Glucose, Bld 165 (H) 70 - 99 mg/dL   BUN 80 (H) 8 - 23 mg/dL   Creatinine, Ser 9.24 (H) 0.44 - 1.00 mg/dL   Calcium 9.4 8.9 - 46.2 mg/dL   GFR calc non Af Amer 8 (L) >60 mL/min   GFR calc Af Amer 10 (L) >60 mL/min  Anion gap 23 (H) 5 - 15    Comment: REPEATED TO VERIFY Performed at [facility], [address] [place], [place] [address]   CBC with Differential     Status: Abnormal   Collection Time: 03/24/19  1:09 PM  Result Value Ref Range   WBC 11.6 (H) 4.0 - 10.5 K/uL   RBC 4.71 3.87 - 5.11 MIL/uL   Hemoglobin 14.6 12.0 - 15.0 g/dL   HCT 86.5 (H) 78.4 - 69.6 %   MCV 98.1 80.0 - 100.0 fL   MCH 31.0 26.0 - 34.0 pg   MCHC 31.6 30.0 - 36.0 g/dL   RDW 29.5 28.4 - 13.2 %   Platelets 200 150 - 400 K/uL   nRBC 0.0 0.0 - 0.2 %   Neutrophils Relative % 87 %   Neutro Abs 10.2 (H) 1.7 - 7.7 K/uL   Lymphocytes Relative 7 %   Lymphs Abs 0.8 0.7 - 4.0 K/uL   Monocytes Relative 5 %   Monocytes Absolute 0.5 0.1 - 1.0 K/uL   Eosinophils Relative 0 %   Eosinophils Absolute 0.0 0.0 - 0.5  K/uL   Basophils Relative 0 %   Basophils Absolute 0.0 0.0 - 0.1 K/uL   Immature Granulocytes 1 %   Abs Immature Granulocytes 0.08 (H) 0.00 - 0.07 K/uL    Comment: Performed at [facility], [address], [place], [place] [address]  Protime-INR     Status: None   Collection Time: 03/24/19  1:09 PM  Result Value Ref Range   Prothrombin Time 13.5 11.4 - 15.2 seconds   INR 1.0 0.8 - 1.2    Comment: (NOTE) INR goal varies based on device and disease states. Performed at [facility], [address], [place], [place] [address]   Hepatic function panel     Status: Abnormal   Collection Time: 03/24/19  1:09 PM  Result Value Ref Range   Total Protein 8.8 (H) 6.5 - 8.1 g/dL   Albumin 4.4 3.5 - 5.0 g/dL   AST 35 15 - 41 U/L   ALT 20 0 - 44 U/L   Alkaline Phosphatase 93 38 - 126 U/L   Total Bilirubin 1.6 (H) 0.3 - 1.2 mg/dL   Bilirubin, Direct 0.3 (H) 0.0 - 0.2 mg/dL   Indirect Bilirubin 1.3 (H) 0.3 - 0.9 mg/dL    Comment: Performed at [facility], [address] [place], [place] [address]  SARS Coronavirus 2 (CEPHEID - Performed in [facility] hospital lab), Hosp Order     Status: None   Collection Time: 03/24/19  1:13 PM  Result Value Ref Range   SARS Coronavirus 2 NEGATIVE NEGATIVE    Comment: (NOTE) If result is NEGATIVE SARS-CoV-2 target nucleic acids are NOT DETECTED. The SARS-CoV-2 RNA is generally detectable in upper and lower  respiratory specimens during the acute phase of infection. The lowest  concentration of SARS-CoV-2 viral copies this assay can detect is 250  copies / mL. A negative result does not preclude SARS-CoV-2 infection  and should not be used as the sole basis for treatment or other  patient management decisions.  A negative result may occur with  improper specimen collection / handling, submission of specimen other  than nasopharyngeal swab, presence of viral mutation(s) within the  areas targeted  by this assay, and inadequate number of viral copies  (<250 copies / mL). A negative result must be combined with clinical  observations, patient history, and  epidemiological information. If result is POSITIVE SARS-CoV-2 target nucleic acids are DETECTED. The SARS-CoV-2 RNA is generally detectable in upper and lower  respiratory specimens dur ing the acute phase of infection.  Positive  results are indicative of active infection with SARS-CoV-2.  Clinical  correlation with patient history and other diagnostic information is  necessary to determine patient infection status.  Positive results do  not rule out bacterial infection or co-infection with other viruses. If result is PRESUMPTIVE POSTIVE SARS-CoV-2 nucleic acids MAY BE PRESENT.   A presumptive positive result was obtained on the submitted specimen  and confirmed on repeat testing.  While 2019 novel coronavirus  (SARS-CoV-2) nucleic acids may be present in the submitted sample  additional confirmatory testing may be necessary for epidemiological  and / or clinical management purposes  to differentiate between  SARS-CoV-2 and other Sarbecovirus currently known to infect humans.  If clinically indicated additional testing with an alternate test  methodology [id number]) is advised. The SARS-CoV-2 RNA is generally  detectable in upper and lower respiratory sp ecimens during the acute  phase of infection. The expected result is Negative. Fact Sheet for Patients:  BoilerBrush.com.cyhttps://www.fda.gov/media/136312/download Fact Sheet for Healthcare Providers: https://pope.com/https://www.fda.gov/media/136313/download This test is not yet approved or cleared by the [place] FDA and has been authorized for detection and/or diagnosis of SARS-CoV-2 by FDA under an Emergency Use Authorization (EUA).  This EUA will remain in effect (meaning this test can be used) for the duration of the COVID-19 declaration under Section 564(b)(1) of the Act, 21 U.S.C. section  360bbb-3(b)(1), unless the authorization is terminated or revoked sooner. Performed at [facility], [address] [place], [place] [address]   ABO/Rh     Status: None   Collection Time: 03/24/19  1:13 PM  Result Value Ref Range   ABO/RH(D)      O POS Performed at [facility], [address] [place], [place] [address]   POC occult blood, ED Provider will collect     Status: Abnormal   Collection Time: 03/24/19  3:50 PM  Result Value Ref Range   Fecal Occult Bld POSITIVE (A) NEGATIVE   No results found.  Pending Labs Unresulted Labs (From admission, onward)    Start     Ordered   03/25/19 0500  Basic metabolic panel  Daily,   R     03/24/19 1640   03/24/19 1641  Creatinine, urine, random  Once,   R     03/24/19 1640   03/24/19 1641  Sodium, urine, random  Once,   R     03/24/19 1640   03/24/19 1636  Lactic acid, plasma  Now then every 2 hours,   STAT     03/24/19 1635          Vitals/Pain Today's Vitals   03/24/19 1600 03/24/19 1615 03/24/19 1645 03/24/19 1727  BP: (!) 84/55 (!) 84/55 (!) 83/56 99/84  Pulse:    (!) 105  Resp: 15 14 20  (!) 22  Temp:      TempSrc:      SpO2:    98%    Isolation Precautions No active isolations  Medications Medications  0.45 % sodium chloride infusion (has no administration in time range)  sodium chloride 0.9 % bolus 500 mL (0 mLs Intravenous Stopped 03/24/19 1422)  pantoprazole (PROTONIX) injection 40 mg (40 mg Intravenous Given 03/24/19 1429)  sodium chloride 0.9 % bolus 1,000 mL (0 mLs Intravenous Stopped 03/24/19 1516)  sodium chloride  0.9 % bolus 2,000 mL (2,000 mLs Intravenous New Bag/Given 03/24/19 1710)    Mobility non-ambulatory

## 2019-03-24 NOTE — H&P (Signed)
History and Physical    Michelle Graves OZH:[id number] DOB: 10/19/1951 DOA: 03/24/2019  [facility] Patient coming from: Home via EMS  Chief Complaint: Dark emesis/stool  HPI: Michelle MallingBarbara J Graves is a 68 y.o. female with medical history significant of hypertension, CKD stage III, rheumatoid arthritis. Difficult to obtain coherent history secondary to patient presentation. Patient presented secondary to nausea, black emesis/stool and abdominal pain. She reports this has been going on for the last three days.  ED Course: Vitals: Temperature of 98.9 F, heart rate of 100, respirations of 20, blood pressure of 80s/50s, SPO2 of 98% on room air. Labs: Chloride of 95, glucose of 165, BUN of 80, creatinine of 5.04, anion gap of 23, bilirubin of 1.6, WBC of 11.6k Imaging: None Medications/Course: Protonix, 3.5L NS bolus  Review of Systems: Review of Systems  Constitutional: Negative for fever.  Respiratory: Negative for shortness of breath.   Gastrointestinal: Positive for abdominal pain, melena, nausea and vomiting.  Neurological: Positive for weakness.  All other systems reviewed and are negative.   Past Medical History:  Diagnosis Date  . Arthritis   . Chest pain   . Diverticulitis   . Hypertension     Past Surgical History:  Procedure Laterality Date  . TONSILLECTOMY       reports that she has never smoked. She has never used smokeless tobacco. She reports current alcohol use of about 1.0 standard drinks of alcohol per week. She reports that she does not use drugs.  No Known Allergies  Family History  Problem Relation Age of Onset  . Kidney disease Neg Hx     Prior to Admission medications   Medication Sig Start Date End Date Taking? Authorizing Provider  amLODipine (NORVASC) 10 MG tablet Take 10 mg by mouth daily.   Yes [provider]  aspirin EC 81 MG tablet Take 81 mg by mouth daily.   Yes [provider]   calcium carbonate (OSCAL) 1500 (600 Ca) MG TABS tablet Take 600 mg of elemental calcium by mouth daily.   Yes [provider]  Carboxymethylcellul-Glycerin (CLEAR EYES FOR DRY EYES OP) Place 2 drops into both eyes daily as needed (dry eyes).   Yes [provider]  cloNIDine (CATAPRES) 0.3 MG tablet Take 0.3 mg by mouth daily. 05/03/18  Yes [provider]  ENBREL SURECLICK 50 MG/ML injection Inject 50 mg into the muscle once a week. 02/28/19  Yes [provider]  hydrochlorothiazide (HYDRODIURIL) 25 MG tablet Take 1 tablet (25 mg total) by mouth daily. 04/22/18  Yes Mesner, Gazelle CowerJason, MD  HYDROcodone-acetaminophen (NORCO) 10-325 MG tablet Take 1 tablet by mouth daily as needed for pain. 03/15/19  Yes [provider]  hydroxychloroquine (PLAQUENIL) 200 MG tablet Take 200 mg by mouth 2 (two) times daily.  06/16/16  Yes [provider]  lisinopril (ZESTRIL) 40 MG tablet Take 40 mg by mouth daily. 03/01/19  Yes [provider]  metoprolol tartrate (LOPRESSOR) 100 MG tablet Take 100 mg by mouth 2 (two) times daily.  11/19/15  Yes [provider]  Misc Natural Products (OSTEO BI-FLEX JOINT SHIELD PO) Take 1 tablet by mouth daily.   Yes [provider]  Multiple Vitamins-Minerals (CENTRUM SILVER 50+WOMEN PO) Take 1 tablet by mouth daily.   Yes [provider]  naproxen (NAPROSYN) 500 MG tablet Take 500 mg by mouth daily.  03/15/18  Yes [provider]  omeprazole (PRILOSEC) 20 MG capsule Take 20 mg by  mouth 2 (two) times daily. 06/16/16  Yes [provider]  potassium chloride SA (K-DUR,KLOR-CON) 20 MEQ tablet Take 2 tablets (40 mEq total) by mouth daily. 04/22/18  Yes Mesner, Tiearra Cower, MD  terbinafine (LAMISIL) 250 MG tablet Take 250 mg by mouth daily. 03/15/18  Yes [provider]  tiZANidine (ZANAFLEX) 2 MG tablet Take 4 mg by mouth at bedtime.    Yes [provider]  traZODone (DESYREL) 100 MG  tablet Take 100 mg by mouth at bedtime.  03/01/19  Yes [provider]  cyclobenzaprine (FLEXERIL) 10 MG tablet Take 1 tablet (10 mg total) by mouth 2 (two) times daily as needed for muscle spasms. Patient not taking: Reported on 03/24/2019 11/21/18   Margarita Grizzle, MD  gabapentin (NEURONTIN) 300 MG capsule Take 1 capsule (300 mg total) by mouth 3 (three) times daily. Patient not taking: Reported on 03/24/2019 03/10/18   Kerrin Champagne, MD  predniSONE (DELTASONE) 20 MG tablet Take 1 tablet (20 mg total) by mouth daily. Patient not taking: Reported on 03/24/2019 05/19/18   Fayrene Helper, PA-C    Physical Exam:  Physical Exam Constitutional:      General: She is not in acute distress.    Appearance: She is well-developed. She is not diaphoretic.  HENT:     Mouth/Throat:     Mouth: Mucous membranes are dry.  Eyes:     Conjunctiva/sclera: Conjunctivae normal.     Pupils: Pupils are equal, round, and reactive to light.  Neck:     Musculoskeletal: Normal range of motion.  Cardiovascular:     Rate and Rhythm: Normal rate and regular rhythm.     Pulses: Decreased pulses.     Heart sounds: Normal heart sounds. No murmur.  Pulmonary:     Effort: Pulmonary effort is normal. No respiratory distress.     Breath sounds: Normal breath sounds. No wheezing or rales.  Abdominal:     General: Bowel sounds are normal. There is no distension.     Palpations: Abdomen is soft.     Tenderness: There is abdominal tenderness in the periumbilical area. There is no guarding or rebound.  Musculoskeletal: Normal range of motion.        General: No tenderness.     Right lower leg: No edema.     Left lower leg: No edema.  Lymphadenopathy:     Cervical: No cervical adenopathy.  Skin:    General: Skin is warm and dry.  Neurological:     Mental Status: She is alert and oriented to person, place, and time.     Cranial Nerves: Cranial nerves are intact.     Labs on Admission: I have personally reviewed  following labs and imaging studies  CBC: Recent Labs  Lab 03/24/19 1309  WBC 11.6*  NEUTROABS 10.2*  HGB 14.6  HCT 46.2*  MCV 98.1  PLT 200    Basic Metabolic Panel: Recent Labs  Lab 03/24/19 1309  NA 139  K 4.4  CL 95*  CO2 21*  GLUCOSE 165*  BUN 80*  CREATININE 5.04*  CALCIUM 9.4    GFR: CrCl cannot be calculated (Unknown ideal weight.).  Liver Function Tests: Recent Labs  Lab 03/24/19 1309  AST 35  ALT 20  ALKPHOS 93  BILITOT 1.6*  PROT 8.8*  ALBUMIN 4.4   No results for input(s): LIPASE, AMYLASE in the last 168 hours. No results for input(s): AMMONIA in the last 168 hours.  Coagulation Profile: Recent Labs  Lab 03/24/19  1309  INR 1.0    Cardiac Enzymes: No results for input(s): CKTOTAL, CKMB, CKMBINDEX, TROPONINI in the last 168 hours.  BNP (last 3 results) No results for input(s): PROBNP in the last 8760 hours.  HbA1C: No results for input(s): HGBA1C in the last 72 hours.  CBG: Recent Labs  Lab 03/24/19 1248  GLUCAP 158*    Lipid Profile: No results for input(s): CHOL, HDL, LDLCALC, TRIG, CHOLHDL, LDLDIRECT in the last 72 hours.  Thyroid Function Tests: No results for input(s): TSH, T4TOTAL, FREET4, T3FREE, THYROIDAB in the last 72 hours.  Anemia Panel: No results for input(s): VITAMINB12, FOLATE, FERRITIN, TIBC, IRON, RETICCTPCT in the last 72 hours.  Urine analysis:    Component Value Date/Time   COLORURINE YELLOW 03/19/2017 1949   APPEARANCEUR HAZY (A) 03/19/2017 1949   LABSPEC 1.014 03/19/2017 1949   PHURINE 5.0 03/19/2017 1949   GLUCOSEU NEGATIVE 03/19/2017 1949   HGBUR NEGATIVE 03/19/2017 1949   BILIRUBINUR NEGATIVE 03/19/2017 1949   KETONESUR NEGATIVE 03/19/2017 1949   PROTEINUR NEGATIVE 03/19/2017 1949   UROBILINOGEN 1.0 06/24/2010 1850   NITRITE NEGATIVE 03/19/2017 1949   LEUKOCYTESUR SMALL (A) 03/19/2017 1949     Radiological Exams on Admission: No results found.  Assessment/Plan Principal Problem:    Acute kidney injury superimposed on CKD (HCC) Active Problems:   HYPERTENSION, MALIGNANT ESSENTIAL   KIDNEY DISEASE, CHRONIC, STAGE III   Hypotension   Dark emesis   Dark stools   Acute kidney injury on CKD stage III Significant volume depletion maybe from emesis? History of difficult to obtain from patient. Nephrology on board. -Nephrology recommendations: IV fluids, renal ultrasound, foley, strict in/out  Hypotension Some mild response to IV fluids. Secondary to significant dehydration. -IV fluids as mentioned above -Lactic acid  Dark emesis/stool Fecal occult positive stool. Patient with NSAIDs as an outpatient. Hemoglobin stable, however, patient is significantly dehydrated and likely very hemoconcentrated -GI consulted -NPO after midnight -Protonix IV BID  Essential hypertension Currently hypotensive. On amlodipine, clonidine, hydrochlorothiazide, lisinopril, metoprolol as an outpatient. -Hold all outpatient antihypertensives  Rheumatoid arthritis On Plaquenil as an outpatient. Apparently completed steroid burst. -Continue Plaquenil   DVT prophylaxis: Lovenox Code Status: DNR Family Communication: Daughter on telephone Disposition Plan: Stepdown Consults called: Nephrology, [facility] Admission status: Inpatient   Jacquelin Hawking, MD [facility] 03/24/2019, 6:12 PM  If 7PM-7AM, please contact night-coverage [email or web address] Password [email or web address]

## 2019-03-24 NOTE — ED Notes (Signed)
Date and time results received: 03/24/19 1847 (use smartphrase ".now" to insert current time)  Test: lactic acid Critical Value: 6.6  Name of Provider Notified:  Orders Received? Or Actions Taken?:RN notified

## 2019-03-24 NOTE — Consult Note (Signed)
Renal Service Consult Note [facility]  Michelle Graves 03/24/2019 Maree Krabbe Requesting Physician:  Dr Effie Shy  Reason for Consult:  AKI HPI: The patient is a 68 y.o. year-old with hx of HTN, diverticulitis, RA on Enbrel/ pred/ hydroxychloroquine admitted for N/V and diarrhea , onset 2 days ago.  BP in ED was 50's sytolic, now in 29'U after 2.5 L IVF's.  Creat 5, baseline creat from last 12 mos is 1.2 - 1.6.  Pt denies any change in urine color, tea colored urine or dysuria.    Pt taking lisinopril and naproxen at home.  No SOB , cough or chest pain. No prod cough.  No bloody diarrhea. No abd pain.   ECHart  2004 - admit for HTNsive urgency  2006 - admit for HTN emergency, cocaine abuse, mild AKI, R hip pain  2017 - admitted for diverticulitis, AKI on CKD2, HTN malignant, CKD III, hx of RA.  rx'd w/ IV cipro/flagyl, got 1u prbc, cont BP meds, baseline creat 1.2- 1.5  ROS  denies CP  no joint pain   no HA  no blurry vision  no rash  no dysuria  no difficulty voiding  no change in urine color    Past Medical History  Past Medical History:  Diagnosis Date  . Arthritis   . Chest pain   . Diverticulitis   . Hypertension    Past Surgical History  Past Surgical History:  Procedure Laterality Date  . TONSILLECTOMY     Family History History reviewed. No pertinent family history. Social History  reports that she has never smoked. She has never used smokeless tobacco. She reports current alcohol use of about 1.0 standard drinks of alcohol per week. She reports that she does not use drugs. Allergies No Known Allergies Home medications Prior to Admission medications   Medication Sig Start Date End Date Taking? Authorizing Provider  amLODipine (NORVASC) 10 MG tablet Take 10 mg by mouth daily.    [provider]  aspirin EC 81 MG tablet Take 81 mg by mouth daily.    [provider]  calcium carbonate (OSCAL) 1500 (600 Ca) MG TABS tablet Take  600 mg of elemental calcium by mouth daily.    [provider]  Carboxymethylcellul-Glycerin (CLEAR EYES FOR DRY EYES OP) Place 2 drops into both eyes daily as needed (dry eyes).    [provider]  cloNIDine (CATAPRES) 0.3 MG tablet Take 0.3 mg by mouth daily. 05/03/18   [provider]  cyclobenzaprine (FLEXERIL) 10 MG tablet Take 1 tablet (10 mg total) by mouth 2 (two) times daily as needed for muscle spasms. 11/21/18   Margarita Grizzle, MD  ENBREL SURECLICK 50 MG/ML injection Inject 50 mg into the muscle once a week. 02/28/19   [provider]  gabapentin (NEURONTIN) 300 MG capsule Take 1 capsule (300 mg total) by mouth 3 (three) times daily. 03/10/18   Kerrin Champagne, MD  hydrochlorothiazide (HYDRODIURIL) 25 MG tablet Take 1 tablet (25 mg total) by mouth daily. 04/22/18   Mesner, Rosalynd Cower, MD  hydroxychloroquine (PLAQUENIL) 200 MG tablet Take 200 mg by mouth 2 (two) times daily.  06/16/16   [provider]  lisinopril (PRINIVIL,ZESTRIL) 20 MG tablet Take 20 mg by mouth daily.    [provider]  metoprolol tartrate (LOPRESSOR) 100 MG tablet Take 100 mg by mouth 2 (two) times daily.  11/19/15   [provider]  Misc Natural Products (OSTEO BI-FLEX JOINT SHIELD PO) Take  1 tablet by mouth daily.    [provider]  Multiple Vitamins-Minerals (CENTRUM SILVER 50+WOMEN PO) Take 1 tablet by mouth daily.    [provider]  naproxen (NAPROSYN) 500 MG tablet Take 500 mg by mouth daily.  03/15/18   [provider]  omeprazole (PRILOSEC) 20 MG capsule Take 20 mg by mouth 2 (two) times daily. 06/16/16   [provider]  potassium chloride SA (K-DUR,KLOR-CON) 20 MEQ tablet Take 2 tablets (40 mEq total) by mouth daily. 04/22/18   Mesner, Oniyah CowerJason, MD  predniSONE (DELTASONE) 20 MG tablet Take 1 tablet (20 mg total) by mouth daily. 05/19/18   Fayrene Helperran, Bowie, PA-C  terbinafine (LAMISIL) 250 MG tablet Take 250 mg by mouth daily. 03/15/18    [provider]  tiZANidine (ZANAFLEX) 2 MG tablet Take 4 mg by mouth at bedtime.     [provider]  traZODone (DESYREL) 100 MG tablet Take 100 mg by mouth daily. 03/01/19   [provider]   Liver Function Tests Recent Labs  Lab 03/24/19 1309  AST 35  ALT 20  ALKPHOS 93  BILITOT 1.6*  PROT 8.8*  ALBUMIN 4.4   No results for input(s): LIPASE, AMYLASE in the last 168 hours. CBC Recent Labs  Lab 03/24/19 1309  WBC 11.6*  NEUTROABS 10.2*  HGB 14.6  HCT 46.2*  MCV 98.1  PLT 200   Basic Metabolic Panel Recent Labs  Lab 03/24/19 1309  NA 139  K 4.4  CL 95*  CO2 21*  GLUCOSE 165*  BUN 80*  CREATININE 5.04*  CALCIUM 9.4   Iron/TIBC/Ferritin/ %Sat    Component Value Date/Time   IRON 61 08/29/2016 0224   TIBC 227 (L) 08/29/2016 0224   FERRITIN 555 (H) 08/29/2016 0224   IRONPCTSAT 27 08/29/2016 0224    Vitals:   03/24/19 1420 03/24/19 1430 03/24/19 1505 03/24/19 1540  BP: (!) 75/50 (!) 76/51 (!) 88/71 (!) 57/36  Pulse:  (!) 106    Resp:  17 17 17   SpO2:  93%     Exam Gen awake, chron ill appearing, cool skin to touch, no distress calm No rash, cyanosis or gangrene Sclera anicteric, throat mod dry  No jvd or bruits Chest clear bilat to bases no rales or wheezing RRR no MRG Abd soft ntnd no mass or ascites +bs GU defer MS no joint effusions or deformity Ext no sig LE or UE edema, no wounds or ulcers Neuro is alert, Ox 3 , nf    Home meds:  - amlodipine 10/ clonidine 0.3 qd/ hydrochlorothiazide 25 qd/ lisinopril 20 qd/ metoprolol 100 bid/ Kdur 40 qd  - prednisone 20 qd/ enbrel 50 mg weekly/ hydroxychloroquine 200 bid  - trazodone 100 qd  - omeprazole 20 bid/ terbinafine 250 qd  - aspirin 81   - prn naproxen 500 qd  - prn's/ vitamins/ supplements   Na 138  K 4.4  CO2 21  BUN 80  Cr 5.0  AG 23   Tprot 8.8  Alb 4.4 LFT's ok except for total bili 1.6    WBC 11k  Hb 14  plt 200   Assessment: 1. AKI on CKD 3 - in patinet w/ GI  illness (N/V/D), baseline creat 1.2- 1.6 and ACEi/ nsaid Rx.  Pt dehydrated on exam and cool to touch. BP's up 50's > 80's after NS boluses in ED.  Cont high rate saline and will order foley cath for strict I/O's.  Cont to hold all BP meds.  Avoid contrast as well. Get renal US and UA. Doubt any GN or obstruction, should improve w/ volume restoration.  2. Vol depletion 3. N/V / diarrhea 4. RA - on prednisone and Enbrel 5. Obesity 6. HTN - holding all meds    Plan: 1. Will follow.       Vinson Moselle  MD 03/24/2019, 4:12 PM

## 2019-03-24 NOTE — ED Notes (Signed)
Bed: WA09 Expected date:  Expected time:  Means of arrival:  Comments: EMS-abdominal pain/hypotensive

## 2019-03-24 NOTE — ED Notes (Signed)
Patient was soiled all over. Patient cleaned, new sheets placed, and new brief placed on patient. Patient repositioned. Call bell at bedside. Pure wick placed on patient.

## 2019-03-24 NOTE — ED Provider Notes (Addendum)
[facility]-EMERGENCY DEPT Provider Note   CSN: [id number] Arrival date & time: 03/24/19  1234    History   Chief Complaint Chief Complaint  Patient presents with  . Abdominal Pain  . Melena  . Hematemesis    HPI Michelle Graves is a 68 y.o. female.     HPI   She presents for evaluation of abdominal pain described as "sharp," pain accompanied by vomiting and diarrhea, both "black," in color.  She presents by EMS who found her to be hypotensive at 60/30.  Oxygen saturation was low during transport at 88%, improved with nasal cannula oxygen to 97%.  Patient is able to give history.  She denies having prior similar problems.  She denies fever, cough, chest pain, focal weakness or paresthesia.  There are no other known modifying factors.  Past Medical History:  Diagnosis Date  . Arthritis   . Chest pain   . Diverticulitis   . Hypertension     Patient Active Problem List   Diagnosis Date Noted  . Diverticulitis of intestine without perforation or abscess without bleeding   . Sinus tachycardia 08/27/2016  . Diverticulitis 08/26/2016  . HYPERTENSION, MALIGNANT ESSENTIAL 06/24/2007  . KIDNEY DISEASE, CHRONIC, STAGE III 06/24/2007  . ARTHRITIS, RHEUMATOID, SEROPOSITIVE 06/24/2007    Past Surgical History:  Procedure Laterality Date  . TONSILLECTOMY       OB History   No obstetric history on file.      Home Medications    Prior to Admission medications   Medication Sig Start Date End Date Taking? Authorizing Provider  amLODipine (NORVASC) 10 MG tablet Take 10 mg by mouth daily.    [provider]  aspirin EC 81 MG tablet Take 81 mg by mouth daily.    [provider]  calcium carbonate (OSCAL) 1500 (600 Ca) MG TABS tablet Take 600 mg of elemental calcium by mouth daily.    [provider]  Carboxymethylcellul-Glycerin (CLEAR EYES FOR DRY EYES OP) Place 2 drops into both eyes daily as needed (dry eyes).    [provider]  cloNIDine (CATAPRES) 0.3 MG tablet Take 0.3 mg by mouth daily. 05/03/18   [provider]  cyclobenzaprine (FLEXERIL) 10 MG tablet Take 1 tablet (10 mg total) by mouth 2 (two) times daily as needed for muscle spasms. 11/21/18   Margarita Grizzle, MD  ENBREL SURECLICK 50 MG/ML injection Inject 50 mg into the muscle once a week. 02/28/19   [provider]  gabapentin (NEURONTIN) 300 MG capsule Take 1 capsule (300 mg total) by mouth 3 (three) times daily. 03/10/18   Kerrin Champagne, MD  hydrochlorothiazide (HYDRODIURIL) 25 MG tablet Take 1 tablet (25 mg total) by mouth daily. 04/22/18   Mesner, Xareni Cower, MD  hydroxychloroquine (PLAQUENIL) 200 MG tablet Take 200 mg by mouth 2 (two) times daily.  06/16/16   [provider]  lisinopril (PRINIVIL,ZESTRIL) 20 MG tablet Take 20 mg by mouth daily.    [provider]  metoprolol tartrate (LOPRESSOR) 100 MG tablet Take 100 mg by mouth 2 (two) times daily.  11/19/15   [provider]  Misc Natural Products (OSTEO BI-FLEX JOINT SHIELD PO) Take 1 tablet by mouth daily.    [provider]  Multiple Vitamins-Minerals (CENTRUM SILVER 50+WOMEN PO) Take 1 tablet by mouth daily.    [provider]  naproxen (NAPROSYN) 500 MG tablet Take 500 mg by mouth daily.  03/15/18   [provider]  omeprazole (PRILOSEC) 20  MG capsule Take 20 mg by mouth 2 (two) times daily. 06/16/16   [provider]  potassium chloride SA (K-DUR,KLOR-CON) 20 MEQ tablet Take 2 tablets (40 mEq total) by mouth daily. 04/22/18   Mesner, Dashawn CowerJason, MD  predniSONE (DELTASONE) 20 MG tablet Take 1 tablet (20 mg total) by mouth daily. 05/19/18   Fayrene Helperran, Bowie, PA-C  terbinafine (LAMISIL) 250 MG tablet Take 250 mg by mouth daily. 03/15/18   [provider]  tiZANidine (ZANAFLEX) 2 MG tablet Take 4 mg by mouth at bedtime.     [provider]  traZODone (DESYREL) 100 MG tablet Take 100 mg by mouth daily. 03/01/19   [provider]    Family History History reviewed. No pertinent family history.  Social History Social History   Tobacco Use  . Smoking status: Never Smoker  . Smokeless tobacco: Never Used  Substance Use Topics  . Alcohol use: Yes    Alcohol/week: 1.0 standard drinks    Types: 1 Shots of liquor per week    Comment: sometimes   . Drug use: No     Allergies   Patient has no known allergies.   Review of Systems Review of Systems  All other systems reviewed and are negative.    Physical Exam Updated Vital Signs BP (!) 57/36   Pulse (!) 106   Resp 17   SpO2 93%   Physical Exam Vitals signs and nursing note reviewed.  Constitutional:      General: She is not in acute distress.    Appearance: She is well-developed. She is not ill-appearing, toxic-appearing or diaphoretic.  HENT:     Head: Normocephalic and atraumatic.     Right Ear: External ear normal.     Left Ear: External ear normal.     Mouth/Throat:     Pharynx: No pharyngeal swelling or oropharyngeal exudate.  Eyes:     Conjunctiva/sclera: Conjunctivae normal.     Pupils: Pupils are equal, round, and reactive to light.  Neck:     Musculoskeletal: Normal range of motion and neck supple.     Trachea: Phonation normal.  Cardiovascular:     Rate and Rhythm: Normal rate and regular rhythm.     Heart sounds: Normal heart sounds. No murmur.  Pulmonary:     Effort: Pulmonary effort is normal.     Breath sounds: Normal breath sounds.  Abdominal:     General: There is no distension.     Palpations: Abdomen is soft. There is no mass.     Tenderness: There is no abdominal tenderness.     Hernia: No hernia is present.  Musculoskeletal: Normal range of motion.  Skin:    General: Skin is warm and dry.     Coloration: Skin is not jaundiced.  Neurological:     Mental Status: She is alert and oriented to person, place, and time.     Cranial Nerves: No cranial nerve deficit.     Sensory: No sensory deficit.      Motor: No abnormal muscle tone.     Coordination: Coordination normal.  Psychiatric:        Mood and Affect: Mood normal.        Behavior: Behavior normal.        Thought Content: Thought content normal.        Judgment: Judgment normal.      ED Treatments / Results  Labs (all labs ordered are listed, but only abnormal results are displayed) Labs Reviewed  BASIC METABOLIC PANEL - Abnormal; Notable for the following components:      Result Value   Chloride 95 (*)    CO2 21 (*)    Glucose, Bld 165 (*)    BUN 80 (*)    Creatinine, Ser 5.04 (*)    GFR calc non Af Amer 8 (*)    GFR calc Af Amer 10 (*)    Anion gap 23 (*)    All other components within normal limits  CBC WITH DIFFERENTIAL/PLATELET - Abnormal; Notable for the following components:   WBC 11.6 (*)    HCT 46.2 (*)    Neutro Abs 10.2 (*)    Abs Immature Granulocytes 0.08 (*)    All other components within normal limits  HEPATIC FUNCTION PANEL - Abnormal; Notable for the following components:   Total Protein 8.8 (*)    Total Bilirubin 1.6 (*)    Bilirubin, Direct 0.3 (*)    Indirect Bilirubin 1.3 (*)    All other components within normal limits  CBG MONITORING, ED - Abnormal; Notable for the following components:   Glucose-Capillary 158 (*)    All other components within normal limits  POC OCCULT BLOOD, ED - Abnormal; Notable for the following components:   Fecal Occult Bld POSITIVE (*)    All other components within normal limits  SARS CORONAVIRUS 2 (HOSPITAL ORDER, PERFORMED IN [facility])  PROTIME-INR  TYPE AND SCREEN  ABO/RH    EKG None  Radiology No results found.  Procedures .Critical Care Performed by: Mancel BaleWentz, Ledlow, MD Authorized by: Mancel BaleWentz, Feltes, MD   Critical care provider statement:    Critical care time (minutes):  35   Critical care start time:  03/24/2019 12:41 PM   Critical care end time:  03/24/2019 4:15 PM   Critical care time was exclusive of:  Separately billable  procedures and treating other patients   Critical care was necessary to treat or prevent imminent or life-threatening deterioration of the following conditions:  Circulatory failure   Critical care was time spent personally by me on the following activities:  Blood draw for specimens, development of treatment plan with patient or surrogate, discussions with consultants, evaluation of patient's response to treatment, examination of patient, obtaining history from patient or surrogate, ordering and performing treatments and interventions, ordering and review of laboratory studies, pulse oximetry, re-evaluation of patient's condition, review of old charts and ordering and review of radiographic studies   (including critical care time)  Medications Ordered in ED Medications  sodium chloride 0.9 % bolus 500 mL (0 mLs Intravenous Stopped 03/24/19 1422)  pantoprazole (PROTONIX) injection 40 mg (40 mg Intravenous Given 03/24/19 1429)  sodium chloride 0.9 % bolus 1,000 mL (0 mLs Intravenous Stopped 03/24/19 1516)     Initial Impression / Assessment and Plan / ED Course  I have reviewed the triage vital signs and the nursing notes.  Pertinent labs & imaging results that were available during my care of the patient were reviewed by me and considered in my medical decision making (see chart for details).  Clinical Course as of Mar 23 1634  Thu Mar 24, 2019  1537 nomal  SARS Coronavirus 2 (CE[facility]HEID - [facility]EW]  1537 Normal except chloride low, CO2 low, glucose high, BUN high, creatinine high, GFR low, anion gap high  Basic metabolic panel(!) [EW]  1537 Normal except white count high, hematocrit elevated  CBC with Differential(!) [EW]  1558 I discussed  the case with the patient's daughter who states she is the patient's emergency contact.  Her name is Kerrin Champagneony McRae.  Her phone number is [phone].  She understands that the patient will be admitted.   [EW]   1629 Abnormal, blood present  POC occult blood, ED Provider will collect(!) [EW]    Clinical Course User Index [EW] Mancel BaleWentz, Hanneman, MD        Patient Vitals for the past 24 hrs:  BP Pulse Resp SpO2  03/24/19 1540 (!) 57/36 - 17 -  03/24/19 1505 (!) 88/71 - 17 -  03/24/19 1430 (!) 76/51 (!) 106 17 93 %  03/24/19 1420 (!) 75/50 - - -  03/24/19 1415 (!) 79/68 - 20 -  03/24/19 1400 (!) 81/70 - (!) 21 -  03/24/19 1345 100/84 - (!) 21 -  03/24/19 1330 94/72 - 20 -  03/24/19 1310 (!) 83/67 (!) 123 (!) 21 93 %    3:38 PM Reevaluation with update and discussion. After initial assessment and treatment, an updated evaluation reveals she remains alert and conversant although weak.  Findings discussed with the patient and all questions were answered. Mancel BaleElliott Gwendelyn Lanting   4:05 PM-requested callback from nephrology regarding acute renal failure.  Case discussed with Dr. Arta SilenceShertz who recommends IV fluids check urine, and consult his service as needed.  4:12 PM-Consult complete with hospitalist. Patient case explained and discussed. He agrees to admit patient for further evaluation and treatment. Call ended at 4:35 PM   Medical Decision Making: Patient presenting with malaise, hypotension, and renal failure.  She is currently being treated as an outpatient with multiple medications for hypertension and has been noted to be "uncontrolled."  She has a history of rheumatoid arthritis and is also being treated for that, currently.  She will require admission for further management and treatment.  Screening Covid-19, done for hospital policy is negative.  Fenton MallingBarbara J Helget was evaluated in Emergency Department on 04/04/2019 for the symptoms described in the history of present illness. She was evaluated in the context of the global COVID-19 pandemic, which necessitated consideration that the patient might be at risk for infection with the SARS-CoV-2 virus that causes COVID-19. Institutional protocols and  algorithms that pertain to the evaluation of patients at risk for COVID-19 are in a state of rapid change based on information released by regulatory bodies including the CDC and federal and state organizations. These policies and algorithms were followed during the patient's care in the ED.   CRITICAL CARE-yes Performed by: Mancel BaleElliott Lennyx Verdell  Nursing Notes Reviewed/ Care Coordinated Applicable Imaging Reviewed Interpretation of Laboratory Data incorporated into ED treatment    Final Clinical Impressions(s) / ED Diagnoses   Final diagnoses:  Acute renal failure, unspecified acute renal failure type (HCC)  Hypotension due to hypovolemia  Non-intractable vomiting with nausea, unspecified vomiting type    ED Discharge Orders    None       Mancel BaleWentz, Vandiver, MD 03/24/19 1637    Mancel BaleWentz, Novelo, MD 04/04/19 [phone]

## 2019-03-24 NOTE — ED Notes (Signed)
Hospitalist at bedside 

## 2019-03-24 NOTE — ED Triage Notes (Signed)
Patient BIB EMS from home where she lives by herself. Patient reporting diffuse upper quadrant abdominal pain for 2-4 days. Patient reports vomiting "Black" for the past 4 days as well as black diarrhea x4 days. Patient's initial BP for EMS was 60/30. CBG= 190. Afebrile 97.26f for EMS. Patient cold to the touch. Very weak. Responsive to pain. SPO2 88% RA for EMS- 2LNC O2 sat increase to 97%.

## 2019-03-25 ENCOUNTER — Other Ambulatory Visit: Payer: Self-pay

## 2019-03-25 ENCOUNTER — Inpatient Hospital Stay (HOSPITAL_COMMUNITY): Payer: Medicare Other

## 2019-03-25 DIAGNOSIS — N179 Acute kidney failure, unspecified: Secondary | ICD-10-CM

## 2019-03-25 DIAGNOSIS — N189 Chronic kidney disease, unspecified: Secondary | ICD-10-CM

## 2019-03-25 LAB — COMPREHENSIVE METABOLIC PANEL
ALT: 16 U/L (ref 0–44)
AST: 29 U/L (ref 15–41)
Albumin: 2.4 g/dL — ABNORMAL LOW (ref 3.5–5.0)
Alkaline Phosphatase: 64 U/L (ref 38–126)
Anion gap: 11 (ref 5–15)
BUN: 77 mg/dL — ABNORMAL HIGH (ref 8–23)
CO2: 20 mmol/L — ABNORMAL LOW (ref 22–32)
Calcium: 6.6 mg/dL — ABNORMAL LOW (ref 8.9–10.3)
Chloride: 105 mmol/L (ref 98–111)
Creatinine, Ser: 3.12 mg/dL — ABNORMAL HIGH (ref 0.44–1.00)
GFR calc Af Amer: 17 mL/min — ABNORMAL LOW (ref >60)
GFR calc non Af Amer: 15 mL/min — ABNORMAL LOW (ref >60)
Glucose, Bld: 79 mg/dL (ref 70–99)
Potassium: 4.5 mmol/L (ref 3.5–5.1)
Sodium: 136 mmol/L (ref 135–145)
Total Bilirubin: 0.9 mg/dL (ref 0.3–1.2)
Total Protein: 5.4 g/dL — ABNORMAL LOW (ref 6.5–8.1)

## 2019-03-25 LAB — BASIC METABOLIC PANEL
Anion gap: 15 (ref 5–15)
BUN: 72 mg/dL — ABNORMAL HIGH (ref 8–23)
CO2: 13 mmol/L — ABNORMAL LOW (ref 22–32)
Calcium: 7.3 mg/dL — ABNORMAL LOW (ref 8.9–10.3)
Chloride: 111 mmol/L (ref 98–111)
Creatinine, Ser: 3.89 mg/dL — ABNORMAL HIGH (ref 0.44–1.00)
GFR calc Af Amer: 13 mL/min — ABNORMAL LOW (ref >60)
GFR calc non Af Amer: 11 mL/min — ABNORMAL LOW (ref >60)
Glucose, Bld: 88 mg/dL (ref 70–99)
Potassium: 4.7 mmol/L (ref 3.5–5.1)
Sodium: 139 mmol/L (ref 135–145)

## 2019-03-25 LAB — CBC WITH DIFFERENTIAL/PLATELET
Abs Immature Granulocytes: 0.4 10*3/uL — ABNORMAL HIGH (ref 0.00–0.07)
Basophils Absolute: 0.1 10*3/uL (ref 0.0–0.1)
Basophils Relative: 1 %
Eosinophils Absolute: 0 10*3/uL (ref 0.0–0.5)
Eosinophils Relative: 0 %
HCT: 32.5 % — ABNORMAL LOW (ref 36.0–46.0)
Hemoglobin: 10.4 g/dL — ABNORMAL LOW (ref 12.0–15.0)
Immature Granulocytes: 4 %
Lymphocytes Relative: 8 %
Lymphs Abs: 0.8 10*3/uL (ref 0.7–4.0)
MCH: 30.8 pg (ref 26.0–34.0)
MCHC: 32 g/dL (ref 30.0–36.0)
MCV: 96.2 fL (ref 80.0–100.0)
Monocytes Absolute: 0.4 10*3/uL (ref 0.1–1.0)
Monocytes Relative: 4 %
Neutro Abs: 9.1 10*3/uL — ABNORMAL HIGH (ref 1.7–7.7)
Neutrophils Relative %: 83 %
Platelets: 137 10*3/uL — ABNORMAL LOW (ref 150–400)
RBC: 3.38 MIL/uL — ABNORMAL LOW (ref 3.87–5.11)
RDW: 14.7 % (ref 11.5–15.5)
WBC: 10.7 10*3/uL — ABNORMAL HIGH (ref 4.0–10.5)
nRBC: 0 % (ref 0.0–0.2)

## 2019-03-25 LAB — CBC
HCT: 38.8 % (ref 36.0–46.0)
Hemoglobin: 12.4 g/dL (ref 12.0–15.0)
MCH: 30.7 pg (ref 26.0–34.0)
MCHC: 32 g/dL (ref 30.0–36.0)
MCV: 96 fL (ref 80.0–100.0)
Platelets: 105 10*3/uL — ABNORMAL LOW (ref 150–400)
RBC: 4.04 MIL/uL (ref 3.87–5.11)
RDW: 14.6 % (ref 11.5–15.5)
WBC: 9.9 10*3/uL (ref 4.0–10.5)
nRBC: 0 % (ref 0.0–0.2)

## 2019-03-25 LAB — BLOOD GAS, ARTERIAL
Acid-base deficit: 4.8 mmol/L — ABNORMAL HIGH (ref 0.0–2.0)
Bicarbonate: 18.8 mmol/L — ABNORMAL LOW (ref 20.0–28.0)
Drawn by: 560031
O2 Content: 2 L/min
O2 Saturation: 91.7 %
Patient temperature: 98.6
pCO2 arterial: 31.1 mmHg — ABNORMAL LOW (ref 32.0–48.0)
pH, Arterial: 7.398 (ref 7.350–7.450)
pO2, Arterial: 63.8 mmHg — ABNORMAL LOW (ref 83.0–108.0)

## 2019-03-25 LAB — LACTIC ACID, PLASMA: Lactic Acid, Venous: 1.8 mmol/L (ref 0.5–1.9)

## 2019-03-25 LAB — AMMONIA: Ammonia: 27 umol/L (ref 9–35)

## 2019-03-25 MED ORDER — MORPHINE SULFATE (PF) 2 MG/ML IV SOLN
2.0000 mg | INTRAVENOUS | Status: DC | PRN
  Administered 2019-03-28: 2 mg via INTRAVENOUS
  Administered 2019-03-28: 4 mg via INTRAVENOUS
  Administered 2019-03-28 – 2019-03-29 (×2): 2 mg via INTRAVENOUS
  Administered 2019-03-29: 4 mg via INTRAVENOUS
  Administered 2019-03-29: 2 mg via INTRAVENOUS
  Administered 2019-03-29 – 2019-04-01 (×5): 4 mg via INTRAVENOUS
  Filled 2019-03-25: qty 2
  Filled 2019-03-25 (×3): qty 1
  Filled 2019-03-25 (×2): qty 2
  Filled 2019-03-25: qty 1
  Filled 2019-03-25 (×5): qty 2

## 2019-03-25 MED ORDER — PNEUMOCOCCAL VAC POLYVALENT 25 MCG/0.5ML IJ INJ
0.5000 mL | INJECTION | INTRAMUSCULAR | Status: DC
  Filled 2019-03-25: qty 0.5

## 2019-03-25 MED ORDER — SODIUM CHLORIDE 0.9 % IV SOLN
INTRAVENOUS | Status: DC
  Administered 2019-03-25 (×2): via INTRAVENOUS

## 2019-03-25 MED ORDER — SODIUM CHLORIDE 0.9 % IV BOLUS
1000.0000 mL | Freq: Once | INTRAVENOUS | Status: AC
  Administered 2019-03-25: 1000 mL via INTRAVENOUS

## 2019-03-25 MED ORDER — ORAL CARE MOUTH RINSE
15.0000 mL | Freq: Two times a day (BID) | OROMUCOSAL | Status: DC
  Administered 2019-03-25 – 2019-04-04 (×20): 15 mL via OROMUCOSAL

## 2019-03-25 MED ORDER — CHLORHEXIDINE GLUCONATE CLOTH 2 % EX PADS
6.0000 | MEDICATED_PAD | Freq: Every day | CUTANEOUS | Status: DC
  Administered 2019-03-25 – 2019-04-04 (×10): 6 via TOPICAL

## 2019-03-25 MED ORDER — PHENYLEPHRINE HCL-NACL 10-0.9 MG/250ML-% IV SOLN
0.0000 ug/min | INTRAVENOUS | Status: DC
  Administered 2019-03-25: 20 ug/min via INTRAVENOUS
  Filled 2019-03-25 (×2): qty 500

## 2019-03-25 MED ORDER — SODIUM CHLORIDE 0.9 % IV BOLUS
500.0000 mL | Freq: Once | INTRAVENOUS | Status: AC
  Administered 2019-03-25: 500 mL via INTRAVENOUS

## 2019-03-25 MED ORDER — STERILE WATER FOR INJECTION IV SOLN
INTRAVENOUS | Status: DC
  Administered 2019-03-25 – 2019-03-27 (×3): via INTRAVENOUS
  Filled 2019-03-25 (×8): qty 850

## 2019-03-25 MED ORDER — SODIUM CHLORIDE 0.9 % IV BOLUS
250.0000 mL | Freq: Once | INTRAVENOUS | Status: AC
  Administered 2019-03-25: 250 mL via INTRAVENOUS

## 2019-03-25 MED ORDER — MORPHINE SULFATE (PF) 2 MG/ML IV SOLN
2.0000 mg | Freq: Once | INTRAVENOUS | Status: AC
  Administered 2019-03-25: 2 mg via INTRAVENOUS
  Filled 2019-03-25: qty 1

## 2019-03-25 NOTE — Progress Notes (Signed)
CRITICAL VALUE ALERT  Critical Value:  LA 5.8  Date & Time Notied:  03/25/19 2324  Provider Notified: No trending down  Orders Received/Actions taken: No new orders at this time.

## 2019-03-25 NOTE — Progress Notes (Signed)
Elink notified of blood pressure in mid-740's systolic by art line after morphine push. 250 cc NS bolus ordered and administered. Pt is asleep at this time, but easy to arouse and following commands. Will continue to closely monitor.

## 2019-03-25 NOTE — Procedures (Signed)
Central Venous Catheter Insertion Procedure Note EVANNIE EISELE [id number] 08/02/1951  Procedure: Insertion of Central Venous Catheter Indications: Assessment of intravascular volume and Drug and/or fluid administration  Procedure Details Consent: Risks of procedure as well as the alternatives and risks of each were explained to the (patient/caregiver).  Consent for procedure obtained. Time Out: Verified patient identification, verified procedure, site/side was marked, verified correct patient position, special equipment/implants available, medications/allergies/relevent history reviewed, required imaging and test results available.  Performed  Maximum sterile technique was used including antiseptics, cap, gloves, gown, hand hygiene, mask and sheet. Skin prep: Chlorhexidine; local anesthetic administered A antimicrobial bonded/coated triple lumen catheter was placed in the right internal jugular vein using the Seldinger technique.  Evaluation Blood flow good Complications: No apparent complications Patient did tolerate procedure well. Chest X-ray ordered to verify placement.  CXR: pending.  Adewale A Olalere 03/25/2019, 5:43 PM

## 2019-03-25 NOTE — Progress Notes (Signed)
Chest x-ray reviewed  Central line pulled back about 1.5 centimeters  Line can be used

## 2019-03-25 NOTE — Progress Notes (Signed)
PROGRESS NOTE    Michelle Graves  ZOX:[id number] DOB: 04/16/1951 DOA: 03/24/2019 PCP: [facility]    Brief Narrative:  Patient is 68 year old female with history of hypertension, stage III CKD with reported baseline creatinine of 1.6, rheumatoid arthritis who presented to the hospital with nausea, black emesis and abdominal pain for about 3 days.  In the emergency room, patient was afebrile, heart rate 100, respiratory 20, blood pressures 80/50.  Creatinine of 5.04 and bilirubin of 1.6.  Hemoglobin was stable at 14.  She was given Protonix, fluid boluses and admitted for acute renal failure.   Assessment & Plan:   Principal Problem:   Acute kidney injury superimposed on CKD (HCC) Active Problems:   HYPERTENSION, MALIGNANT ESSENTIAL   KIDNEY DISEASE, CHRONIC, STAGE III   Hypotension   Dark emesis   Dark stools  Acute kidney injury with history of hypertensive kidney disease stage III: Probably combined cause.  Severe dehydration.  Also with use of NSAID'sAnd ongoing use of ACE inhibitor. Treated with IV fluid boluses.  Holding all antihypertensive medications. Blood pressures are stabilizing.  Renal functions improving.  Urine output about 675 mL since admission and acceptable.  No evidence of obvious uremia. Continue bicarbonate infusion.  Followed by nephrology. Continue strict intake and output monitoring, will need to continue Foley catheter for strict monitoring. Renal ultrasound shows chronic renal disease, no hydronephrosis or obstruction.  Nausea, vomiting with coffee-ground emesis: Probably peptic ulcer disease.  Aggravated by use of NSAID's. Hemoglobin has remained stable.  No evidence of ongoing bleed.  Remains on Protonix twice daily. Will be seen by gastroenterology.  Will benefit with endoscopic evaluation at some point. Continue n.p.o. until finalized endoscopy planned for today.  Lactic acidosis: Probably due to severe dehydration.   No evidence of localizing infection.  Will repeat to make sure it normalizes.  Essential hypertension:  Holding amlodipine, clonidine, hydrochlorothiazide and lisinopril as well as metoprolol.  Rheumatoid arthritis:  On Plaquenil that we will continue.  Recently completed a steroid burst.  Avoid NSAID's.   DVT prophylaxis: SCDs Code Status: DNR Family Communication: We will communicate with family Disposition Plan: Inpatient stepdown   Consultants:   Nephrology  GI  Procedures:   None  Antimicrobials:   None   Subjective: Patient seen and examined.  Complains of diffuse abdominal pain.  Denies any nausea vomiting.  She is n.p.o. however.  Denies any shortness of breath.  Urine output about 675 mL since admission.  Still looks dry.  Objective: Vitals:   03/25/19 0400 03/25/19 0500 03/25/19 0627 03/25/19 0800  BP: (!) 82/55 93/60 107/64   Pulse: (!) 109 (!) 111 (!) 112   Resp: 20 (!) 22 (!) 23   Temp:    98.8 F (37.1 C)  TempSrc:    Axillary  SpO2: 93% 93% 95%   Weight:  99.5 kg      Intake/Output Summary (Last 24 hours) at 03/25/2019 0933 Last data filed at 03/25/2019 0800 Gross per 24 hour  Intake 1738.5 ml  Output 675 ml  Net 1063.5 ml   Filed Weights   03/25/19 0500  Weight: 99.5 kg    Examination:  General exam: Appears anxious, eyes are closed.  Looks older than his stated age.  Mucous membranes are dry. Respiratory system: Clear to auscultation. Respiratory effort normal. Cardiovascular system: S1 & S2 heard, RRR. No JVD, murmurs, rubs, gallops or clicks. No pedal edema. Gastrointestinal system: Abdomen is nondistended, soft and mildly tender all  over the abdomen.  No rigidity or guarding.  No organomegaly or masses felt. Normal bowel sounds heard. Central nervous system: Alert and oriented. No focal neurological deficits. Extremities: Symmetric 5 x 5 power. Skin: No rashes, lesions or ulcers Psychiatry: Judgement and insight appear normal. Mood  & affect flat and anxious.    Data Reviewed: I have personally reviewed following labs and imaging studies  CBC: Recent Labs  Lab 03/24/19 1309 03/25/19 0300  WBC 11.6* 9.9  NEUTROABS 10.2*  --   HGB 14.6 12.4  HCT 46.2* 38.8  MCV 98.1 96.0  PLT 200 105*   Basic Metabolic Panel: Recent Labs  Lab 03/24/19 1309 03/25/19 0300  NA 139 139  K 4.4 4.7  CL 95* 111  CO2 21* 13*  GLUCOSE 165* 88  BUN 80* 72*  CREATININE 5.04* 3.89*  CALCIUM 9.4 7.3*   GFR: CrCl cannot be calculated (Unknown ideal weight.). Liver Function Tests: Recent Labs  Lab 03/24/19 1309  AST 35  ALT 20  ALKPHOS 93  BILITOT 1.6*  PROT 8.8*  ALBUMIN 4.4   No results for input(s): LIPASE, AMYLASE in the last 168 hours. No results for input(s): AMMONIA in the last 168 hours. Coagulation Profile: Recent Labs  Lab 03/24/19 1309  INR 1.0   Cardiac Enzymes: No results for input(s): CKTOTAL, CKMB, CKMBINDEX, TROPONINI in the last 168 hours. BNP (last 3 results) No results for input(s): PROBNP in the last 8760 hours. HbA1C: No results for input(s): HGBA1C in the last 72 hours. CBG: Recent Labs  Lab 03/24/19 1248  GLUCAP 158*   Lipid Profile: No results for input(s): CHOL, HDL, LDLCALC, TRIG, CHOLHDL, LDLDIRECT in the last 72 hours. Thyroid Function Tests: No results for input(s): TSH, T4TOTAL, FREET4, T3FREE, THYROIDAB in the last 72 hours. Anemia Panel: No results for input(s): VITAMINB12, FOLATE, FERRITIN, TIBC, IRON, RETICCTPCT in the last 72 hours. Sepsis Labs: Recent Labs  Lab 03/24/19 1821 03/24/19 2255  LATICACIDVEN 6.6* 5.8*    Recent Results (from the past 240 hour(s))  SARS Coronavirus 2 (CEPHEID - Performed in [facility] hospital lab), Hosp Order     Status: None   Collection Time: 03/24/19  1:13 PM  Result Value Ref Range Status   SARS Coronavirus 2 NEGATIVE NEGATIVE Final    Comment: (NOTE) If result is NEGATIVE SARS-CoV-2 target nucleic acids are NOT  DETECTED. The SARS-CoV-2 RNA is generally detectable in upper and lower  respiratory specimens during the acute phase of infection. The lowest  concentration of SARS-CoV-2 viral copies this assay can detect is 250  copies / mL. A negative result does not preclude SARS-CoV-2 infection  and should not be used as the sole basis for treatment or other  patient management decisions.  A negative result may occur with  improper specimen collection / handling, submission of specimen other  than nasopharyngeal swab, presence of viral mutation(s) within the  areas targeted by this assay, and inadequate number of viral copies  (<250 copies / mL). A negative result must be combined with clinical  observations, patient history, and epidemiological information. If result is POSITIVE SARS-CoV-2 target nucleic acids are DETECTED. The SARS-CoV-2 RNA is generally detectable in upper and lower  respiratory specimens dur ing the acute phase of infection.  Positive  results are indicative of active infection with SARS-CoV-2.  Clinical  correlation with patient history and other diagnostic information is  necessary to determine patient infection status.  Positive results do  not rule out bacterial infection or  co-infection with other viruses. If result is PRESUMPTIVE POSTIVE SARS-CoV-2 nucleic acids MAY BE PRESENT.   A presumptive positive result was obtained on the submitted specimen  and confirmed on repeat testing.  While 2019 novel coronavirus  (SARS-CoV-2) nucleic acids may be present in the submitted sample  additional confirmatory testing may be necessary for epidemiological  and / or clinical management purposes  to differentiate between  SARS-CoV-2 and other Sarbecovirus currently known to infect humans.  If clinically indicated additional testing with an alternate test  methodology [phone]([id number]) is advised. The SARS-CoV-2 RNA is generally  detectable in upper and lower respiratory sp ecimens during  the acute  phase of infection. The expected result is Negative. Fact Sheet for Patients:  BoilerBrush.com.cyhttps://www.fda.gov/media/136312/download Fact Sheet for Healthcare Providers: https://pope.com/https://www.fda.gov/media/136313/download This test is not yet approved or cleared by the [place] FDA and has been authorized for detection and/or diagnosis of SARS-CoV-2 by FDA under an Emergency Use Authorization (EUA).  This EUA will remain in effect (meaning this test can be used) for the duration of the COVID-19 declaration under Section 564(b)(1) of the Act, 21 U.S.C. section 360bbb-3(b)(1), unless the authorization is terminated or revoked sooner. Performed at [facility], [address] [place], [place] [address]   MRSA PCR Screening     Status: None   Collection Time: 03/24/19  8:50 PM  Result Value Ref Range Status   MRSA by PCR NEGATIVE NEGATIVE Final    Comment:        The GeneXpert MRSA Assay (FDA approved for NASAL specimens only), is one component of a comprehensive MRSA colonization surveillance program. It is not intended to diagnose MRSA infection nor to guide or monitor treatment for MRSA infections. Performed at [facility], [address] [place], [place] [address]          Radiology Studies: [place] Renal  Result Date: 03/24/2019 CLINICAL DATA:  Chronic kidney disease, stage III EXAM: RENAL / URINARY TRACT ULTRASOUND COMPLETE COMPARISON:  CT scan 03/19/2017 FINDINGS: Right Kidney: Renal measurements: 10.6 x 3.7 x 5.0 cm = volume: 104.7 mL. Slight increased renal echogenicity suggesting medical renal disease. Mild renal cortical thinning. No focal lesions or hydronephrosis. Left Kidney: Renal measurements: 10.6 x 5.4 x 5.0 cm = volume: 151.1 mL. Slight increased renal echogenicity suggesting medical renal disease. Mild renal cortical thinning. No worrisome renal lesions. No hydronephrosis. Bladder: Appears normal for degree of bladder distention.  Other: Incidental gallstone noted the gallbladder measuring 1.7 cm. IMPRESSION: 1. Slight increased renal echogenicity and slight renal cortical thinning consistent with medical renal disease. 2. No worrisome renal lesions or hydronephrosis. 3. Incidental cholelithiasis. Electronically Signed   By: Rudie MeyerP.  Gallerani M.D.   On: 03/24/2019 18:45   Dg Abd Portable 1v  Result Date: 03/24/2019 CLINICAL DATA:  Abdominal pain.  Diverticulitis. EXAM: PORTABLE ABDOMEN - 1 VIEW COMPARISON:  08/10/2017. FINDINGS: There is a large amount of stool in the colon. There is some gaseous distension of both the colon and several small bowel loops. Phleboliths project over the patient's pelvis. There is no frank evidence of a high-grade small bowel obstruction, however the entirety abdomen is not visualized on this exam. There is a linear lucency coursing through the patient's pelvis that is favored to represent artifact. IMPRESSION: 1. Large amount of stool throughout the colon. 2. Mild gaseous distention of loops of colon and small bowel without evidence of a high-grade obstruction. 3. Linear lucency coursing through the pelvis favored to represent artifact, less  likely free air. If there is high clinical suspicion for an acute intra-abdominal process short interval follow-up radiograph is recommended to confirm resolution of this finding. Electronically Signed   By: Katherine Mantle M.D.   On: 03/24/2019 21:43   [place] Ekg Site Rite  Result Date: 03/24/2019 If Site Rite image not attached, placement could not be confirmed due to current cardiac rhythm.       Scheduled Meds: . Chlorhexidine Gluconate Cloth  6 each Topical Daily  . hydroxychloroquine  200 mg Oral BID  . mouth rinse  15 mL Mouth Rinse BID  . pantoprazole (PROTONIX) IV  40 mg Intravenous Q12H  . traZODone  100 mg Oral QHS   Continuous Infusions: .  sodium bicarbonate (isotonic) infusion in sterile water       LOS: 1 day    Time spent: 25  minutes.    Dorcas Carrow, MD [facility] Pager [phone]  If 7PM-7AM, please contact night-coverage [email or web address] Password [facility] 03/25/2019, 9:33 AM

## 2019-03-25 NOTE — Progress Notes (Signed)
Called pt's daughter and received verbal consent witnessed by Zoe RN, CN to get central line placed

## 2019-03-25 NOTE — Progress Notes (Signed)
Spoke with primary RN about PICC placement . Patient has CRCL of 16 and is followed by renal . RN to speak with MD . Will wait for follow up orders .

## 2019-03-25 NOTE — Progress Notes (Signed)
[facility] Progress Note  Subjective: uop 675 recorded yset, creat down 3.8 . BP's improved for a time but now are down again in the 70's, patient somnolent  Vitals:   03/25/19 1250 03/25/19 1330 03/25/19 1400 03/25/19 1430  BP:  (!) 110/52 (!) 85/40 (!) 94/53  Pulse:  (!) 116 (!) 116   Resp:  19 18 (!) 21  Temp:      TempSrc:      SpO2:  94% 93%   Weight:      Height: 5\' 7"  (1.702 m)       Inpatient medications: . Chlorhexidine Gluconate Cloth  6 each Topical Daily  . hydroxychloroquine  200 mg Oral BID  . mouth rinse  15 mL Mouth Rinse BID  . pantoprazole (PROTONIX) IV  40 mg Intravenous Q12H  . [START ON 03/26/2019] pneumococcal 23 valent vaccine  0.5 mL Intramuscular Tomorrow-1000  . traZODone  100 mg Oral QHS   .  sodium bicarbonate (isotonic) infusion in sterile water 125 mL/hr at 03/25/19 1426   acetaminophen **OR** acetaminophen, HYDROcodone-acetaminophen, ondansetron **OR** ondansetron (ZOFRAN) IV    Exam: Gen awake, chron ill appearing No jvd or bruits Chest clear bilat to bases no rales or wheezing RRR no MRG Abd soft ntnd no mass or ascites +bs Ext no sig LE edema  Neuro is alert, Ox 3 , nf    Home meds:  - amlodipine 10/ clonidine 0.3 qd/ hydrochlorothiazide 25 qd/ lisinopril 20 qd/ metoprolol 100 bid/ Kdur 40 qd  - prednisone 20 qd/ enbrel 50 mg weekly/ hydroxychloroquine 200 bid  - trazodone 100 qd  - omeprazole 20 bid/ terbinafine 250 qd  - aspirin 81   - prn naproxen 500 qd  - prn's/ vitamins/ supplements   Na 138  K 4.4  CO2 21  BUN 80  Cr 5.0  AG 23   Tprot 8.8  Alb 4.4 LFT's ok except for total bili 1.6    WBC 11k  Hb 14  plt 200 Renal US 5/28 > IMPRESSION: 1. Slight increased renal echogenicity and slight renal cortical thinning consistent with medical renal disease. 2. No worrisome renal lesions or hydronephrosis. 3. Incidental cholelithiasis.  UA > pending, UNa 48 , UCr 191    Assessment: 1. AKI on CKD 3 - in  patinet w/ GI illness (N/V/D), CKD 1.2- 1.6 and ACEi/ nsaid Rx.  Pt quite dehydrated at time of admission.  Creat down some today w/ UOP+. But BP's low and CCM consulted. Would cont IVF"s. Cont to hold all BP meds. Get renal US and UA. No signs of GN or obstruction. Will follow.  2. Vol depletion 3. N/V / diarrhea 4. RA - on prednisone and Enbrel, immunosuppresed 5. Obesity 6. HTN - holding all meds      Rob [facility] 03/25/2019, 2:59 PM  Iron/TIBC/Ferritin/ %Sat    Component Value Date/Time   IRON 61 08/29/2016 0224   TIBC 227 (L) 08/29/2016 0224   FERRITIN 555 (H) 08/29/2016 0224   IRONPCTSAT 27 08/29/2016 0224   Recent Labs  Lab 03/24/19 1309 03/25/19 0300  NA 139 139  K 4.4 4.7  CL 95* 111  CO2 21* 13*  GLUCOSE 165* 88  BUN 80* 72*  CREATININE 5.04* 3.89*  CALCIUM 9.4 7.3*  ALBUMIN 4.4  --   INR 1.0  --    Recent Labs  Lab 03/24/19 1309  AST 35  ALT 20  ALKPHOS 93  BILITOT 1.6*  PROT 8.8*  Recent Labs  Lab 03/25/19 0300  WBC 9.9  HGB 12.4  HCT 38.8  PLT 105*

## 2019-03-25 NOTE — Consult Note (Signed)
NAME:  Michelle Graves, MRN:  [id number], DOB:  1951/08/28, LOS: 1 ADMISSION DATE:  03/24/2019, CONSULTATION DATE:  03/25/2019 REFERRING MD:  Jerral Ralph , CHIEF COMPLAINT:  Hypotension, shock  Brief History   Patient was admitted with dark emesis/stool  History of present illness   68 year old history of hypertension, chronic kidney disease stage III, rheumatoid arthritis Presented with black emesis and still, abdominal pain 3 days duration  Past Medical History   Past Medical History:  Diagnosis Date  . Arthritis   . Chest pain   . Diverticulitis   . Hypertension    Significant Hospital Events   Persistent hypotension despite fluid resuscitation  Consults:  pccm 03/25/2019  Procedures:  None  Significant Diagnostic Tests:   Abdominal x-ray IMPRESSION: 1. Large amount of stool throughout the colon. 2. Mild gaseous distention of loops of colon and small bowel without evidence of a high-grade obstruction. 3. Linear lucency coursing through the pelvis favored to represent artifact, less likely free air. If there is high clinical suspicion for an acute intra-abdominal process short interval follow-up radiograph is recommended to confirm resolution of this finding. Micro Data:  MRSA by PCR negative   Antimicrobials:  None  Interim history/subjective:  Persistent hypotension despite fluid resuscitation  Objective   Blood pressure (!) 72/36, pulse (!) 120, temperature 98.5 F (36.9 C), temperature source Oral, resp. rate 14, height 5\' 7"  (1.702 m), weight 99.5 kg, SpO2 94 %.        Intake/Output Summary (Last 24 hours) at 03/25/2019 1621 Last data filed at 03/25/2019 1617 Gross per 24 hour  Intake 3342.3 ml  Output 1145 ml  Net 2197.3 ml   Filed Weights   03/25/19 0500  Weight: 99.5 kg    Examination: General: Lethargic HENT: Dry oral mucosa Lungs: Clear breath sounds but decreased effort Cardiovascular: S1-S2 appreciated Abdomen: Soft, bowel sounds  appreciated Extremities: No edema Neuro: Lethargic GU:   Resolved Hospital Problem list     Assessment & Plan:  Hypotension -Continue fluid resuscitation -Patient has very poor access -We will require central line with central venous pressure monitoring to guide fluid resuscitation -arterial line placement  Reported coffee-ground emesis -Defer to GI -Has not had evidence of GI bleeding since being in hospital -Guaiac positive but stools have been brown  Acute kidney injury on chronic kidney disease -Prerenal -Improving with resuscitation  Acute encephalopathy -Likely multifactorial -Concern for sepsis, source is unclear-no fever, no leukocytosis -No obvious source of an infection at present  Hypertension -Hold antihypertensives  Lactic acidosis -Resolving with fluid resuscitation  History of rheumatoid arthritis -Monitor closely  CT abdomen ordered  Best practice:  Diet: N.p.o. DVT prophylaxis: SCD GI prophylaxis: Protonix Mobility: Bedrest Code Status: DNR Family Communication:  Disposition: ICU  Labs   CBC: Recent Labs  Lab 03/24/19 1309 03/25/19 0300  WBC 11.6* 9.9  NEUTROABS 10.2*  --   HGB 14.6 12.4  HCT 46.2* 38.8  MCV 98.1 96.0  PLT 200 105*    Basic Metabolic Panel: Recent Labs  Lab 03/24/19 1309 03/25/19 0300  NA 139 139  K 4.4 4.7  CL 95* 111  CO2 21* 13*  GLUCOSE 165* 88  BUN 80* 72*  CREATININE 5.04* 3.89*  CALCIUM 9.4 7.3*   GFR: Estimated Creatinine Clearance: 17.2 mL/min (A) (by C-G formula based on SCr of 3.89 mg/dL (H)). Recent Labs  Lab 03/24/19 1309 03/24/19 1821 03/24/19 2255 03/25/19 0300  WBC 11.6*  --   --  9.9  LATICACIDVEN  --  6.6* 5.8*  --     Liver Function Tests: Recent Labs  Lab 03/24/19 1309  AST 35  ALT 20  ALKPHOS 93  BILITOT 1.6*  PROT 8.8*  ALBUMIN 4.4   No results for input(s): LIPASE, AMYLASE in the last 168 hours. No results for input(s): AMMONIA in the last 168 hours.  ABG     Component Value Date/Time   TCO2 30 12/18/2008 0927     Coagulation Profile: Recent Labs  Lab 03/24/19 1309  INR 1.0    Cardiac Enzymes: No results for input(s): CKTOTAL, CKMB, CKMBINDEX, TROPONINI in the last 168 hours.  HbA1C: No results found for: HGBA1C  CBG: Recent Labs  Lab 03/24/19 1248  GLUCAP 158*    Review of Systems:   Unobtainable from patient has is encephalopathic  Past Medical History  She,  has a past medical history of Arthritis, Chest pain, Diverticulitis, and Hypertension.   Surgical History    Past Surgical History:  Procedure Laterality Date  . TONSILLECTOMY       Social History   reports that she has never smoked. She has never used smokeless tobacco. She reports current alcohol use of about 21.0 standard drinks of alcohol per week. She reports that she does not use drugs.   Family History   Her family history is negative for Kidney disease.   Allergies No Known Allergies   The patient is critically ill with multiple organ system failure and requires high complexity decision making for assessment and support, frequent evaluation and titration of therapies, advanced monitoring, review of radiographic studies and interpretation of complex data.    Critical Care Time devoted to patient care services, exclusive of separately billable procedures, described in this note is 30 minutes.  Virl Diamond, MD [place], PCCM Cell: [phone]

## 2019-03-25 NOTE — Progress Notes (Signed)
Dr. Wynona Neat placed A-line, RT dressed per protocol.

## 2019-03-25 NOTE — Progress Notes (Signed)
Elink notified of low blood pressures in the mid-80's systolic on art line. Line has been zeroed and calibrated. 500 cc NS bolus ordered to run over one hour. Patient also complains of increased abdominal pain. One time dose of 2 mg morphine ordered and admnistered. Will continue to closely monitor at this time.

## 2019-03-25 NOTE — Procedures (Addendum)
Arterial Catheter Insertion Procedure Note VEDHA CATA [id number] 28-Feb-1951  Procedure: Insertion of Arterial Catheter  Indications: Blood pressure monitoring  Procedure Details Consent: Risks of procedure as well as the alternatives and risks of each were explained to the (patient/caregiver).  Consent for procedure obtained. Time Out: Verified patient identification, verified procedure, site/side was marked, verified correct patient position, special equipment/implants available, medications/allergies/relevent history reviewed, required imaging and test results available.  Performed  Maximum sterile technique was used including antiseptics, cap, gloves, gown, hand hygiene, mask and sheet. Skin prep: Iodine solution; local anesthetic administered 20 catheter was inserted into right radial artery using the Seldinger technique. ULTRASOUND GUIDANCE USED: YES Evaluation Blood flow good; BP tracing good. Complications: No apparent complications.   Dorathy Daft A Citlalic Norlander 03/25/2019

## 2019-03-25 NOTE — Progress Notes (Signed)
eLink Physician-Brief Progress Note Patient Name: Michelle Graves DOB: 06/12/1951 MRN: [id number]   Date of Service  03/25/2019  HPI/Events of Note  Persistent hypotension despite fluid bolus.  CVP 13.  eICU Interventions  Plan: Start NEO gtt for BP support given tachycardia     Intervention Category Intermediate Interventions: Hypotension - evaluation and management  Bret Vanessen 03/25/2019, 9:48 PM

## 2019-03-25 NOTE — Progress Notes (Signed)
Labs this am unable to be collected, pt stuck by 2 different techs multiple times.  IV also touched base about PICC and noted that Nephrology would need to sign off before they could place

## 2019-03-25 NOTE — Procedures (Signed)
Arterial Catheter Insertion Procedure Note DEJAHNAE HOLSTER [id number] [birth year]  Procedure: Insertion of Arterial Catheter  Indications: Blood pressure monitoring  Procedure Details Consent: Risks of procedure as well as the alternatives and risks of each were explained to the (patient/caregiver).  Consent for procedure obtained. Time Out: Verified patient identification, verified procedure, site/side was marked, verified correct patient position, special equipment/implants available, medications/allergies/relevent history reviewed, required imaging and test results available.  Performed  Maximum sterile technique was used including antiseptics, cap, gloves, gown, hand hygiene, mask and sheet. Skin prep: Chlorhexidine; local anesthetic administered 22 gauge catheter was inserted into right radial artery using the Seldinger technique. ULTRASOUND GUIDANCE USED: YES Evaluation Blood flow good; BP tracing good. Complications: No apparent complications.   Adewale A Olalere 03/25/2019

## 2019-03-25 NOTE — Consult Note (Signed)
Referring Provider:  Dr. Corrie Mckusick Primary Care Physician:  [facility] Primary Gastroenterologist:  Dr. Levora Angel  Reason for Consultation:  Coffee ground emesis  HPI: Michelle Graves is a 68 y.o. female admitted through the emergency room yesterday with coffee-ground emesis and severe dehydration.  We are asked to see her because of possible GI bleeding, characterized by Hemoccult positive stool and the above mentioned reported coffee-ground emesis.  Unfortunately, the patient, although oriented, is quite somnolent and not able to give a clear, easy to follow history.    From what it is worth, she indicates that, for 6 months or so, her abdomen has been sore, where it would hurt to hug somebody.  She has not "been feeling good at all."  She states she has been vomiting "for a while."  As far as I am aware, there is no history of prior ulcer disease.    She is on daily nonsteroidal anti-inflammatory drugs as well as low-dose daily aspirin, but also ostensibly is on omeprazole 20 mg twice daily.  In the emergency room, the patient had Hemoccult positive stool but since then, she has had several bowel movements which are brown in color, not at all melenic, per conversation with the nurse.    Following overnight hydration, the patient's hemoglobin is 12.4, essentially unchanged over the past year.  She did come in with evidence of dehydration, with hemoconcentration (hemoglobin 14.6 with normal MCV, whereas 4 months earlier it was 12.1), but a marked rise in her BUN from a baseline of 15 when checked 4 months ago to an admission level of 80, with a corresponding increase in her creatinine during that same interval from 1.24 to 5.04.  The patient reportedly had colonoscopy in [place] in 2016, at which time apparently a small polyp was removed.  In the hospital, the patient has been running soft blood pressures, as low as 56/40, currently 110/52 following  fluid bolus.   Past Medical History:  Diagnosis Date  . Arthritis   . Chest pain   . Diverticulitis   . Hypertension     Past Surgical History:  Procedure Laterality Date  . TONSILLECTOMY      Prior to Admission medications   Medication Sig Start Date End Date Taking? Authorizing Provider  amLODipine (NORVASC) 10 MG tablet Take 10 mg by mouth daily.   Yes [provider]  aspirin EC 81 MG tablet Take 81 mg by mouth daily.   Yes [provider]  calcium carbonate (OSCAL) 1500 (600 Ca) MG TABS tablet Take 600 mg of elemental calcium by mouth daily.   Yes [provider]  Carboxymethylcellul-Glycerin (CLEAR EYES FOR DRY EYES OP) Place 2 drops into both eyes daily as needed (dry eyes).   Yes [provider]  cloNIDine (CATAPRES) 0.3 MG tablet Take 0.3 mg by mouth daily. 05/03/18  Yes [provider]  ENBREL SURECLICK 50 MG/ML injection Inject 50 mg into the muscle once a week. 02/28/19  Yes [provider]  hydrochlorothiazide (HYDRODIURIL) 25 MG tablet Take 1 tablet (25 mg total) by mouth daily. 04/22/18  Yes Mesner, Bristol Cower, MD  HYDROcodone-acetaminophen (NORCO) 10-325 MG tablet Take 1 tablet by mouth daily as needed for pain. 03/15/19  Yes [provider]  hydroxychloroquine (PLAQUENIL) 200 MG tablet Take 200 mg by mouth 2 (two) times daily.  06/16/16  Yes [provider]  lisinopril (ZESTRIL) 40 MG tablet Take 40 mg by mouth daily. 03/01/19  Yes [provider]  metoprolol tartrate (LOPRESSOR) 100 MG tablet Take 100 mg by mouth 2 (two) times daily.  11/19/15  Yes [provider]  Misc Natural Products (OSTEO BI-FLEX JOINT SHIELD PO) Take 1 tablet by mouth daily.   Yes [provider]  Multiple Vitamins-Minerals (CENTRUM SILVER 50+WOMEN PO) Take 1 tablet by mouth daily.   Yes [provider]  naproxen (NAPROSYN) 500 MG tablet Take 500 mg by mouth daily.  03/15/18  Yes [provider]   omeprazole (PRILOSEC) 20 MG capsule Take 20 mg by mouth 2 (two) times daily. 06/16/16  Yes [provider]  potassium chloride SA (K-DUR,KLOR-CON) 20 MEQ tablet Take 2 tablets (40 mEq total) by mouth daily. 04/22/18  Yes Mesner, Jadda CowerJason, MD  terbinafine (LAMISIL) 250 MG tablet Take 250 mg by mouth daily. 03/15/18  Yes [provider]  tiZANidine (ZANAFLEX) 2 MG tablet Take 4 mg by mouth at bedtime.    Yes [provider]  traZODone (DESYREL) 100 MG tablet Take 100 mg by mouth at bedtime.  03/01/19  Yes [provider]  cyclobenzaprine (FLEXERIL) 10 MG tablet Take 1 tablet (10 mg total) by mouth 2 (two) times daily as needed for muscle spasms. Patient not taking: Reported on 03/24/2019 11/21/18   Margarita Grizzleay, Danielle, MD  gabapentin (NEURONTIN) 300 MG capsule Take 1 capsule (300 mg total) by mouth 3 (three) times daily. Patient not taking: Reported on 03/24/2019 03/10/18   Kerrin ChampagneNitka, James E, MD  predniSONE (DELTASONE) 20 MG tablet Take 1 tablet (20 mg total) by mouth daily. Patient not taking: Reported on 03/24/2019 05/19/18   Fayrene Helperran, [place], PA-C    Current Facility-Administered Medications  Medication Dose Route Frequency Provider Last Rate Last Dose  . acetaminophen (TYLENOL) tablet 650 mg  650 mg Oral Q6H PRN Narda BondsNettey, Ralph A, MD       Or  . acetaminophen (TYLENOL) suppository 650 mg  650 mg Rectal Q6H PRN Narda BondsNettey, Ralph A, MD      . Chlorhexidine Gluconate Cloth 2 % PADS 6 each  6 each Topical Daily Narda BondsNettey, Ralph A, MD      . HYDROcodone-acetaminophen (NORCO) 10-325 MG per tablet 1 tablet  1 tablet Oral Q6H PRN Narda BondsNettey, Ralph A, MD   1 tablet at 03/25/19 0934  . hydroxychloroquine (PLAQUENIL) tablet 200 mg  200 mg Oral BID Narda BondsNettey, Ralph A, MD   200 mg at 03/25/19 0920  . MEDLINE mouth rinse  15 mL Mouth Rinse BID Narda BondsNettey, Ralph A, MD   15 mL at 03/25/19 0934  . ondansetron (ZOFRAN) tablet 4 mg  4 mg Oral Q6H PRN Narda BondsNettey, Ralph A, MD       Or  . ondansetron [facility](ZOFRAN) injection 4 mg   4 mg Intravenous Q6H PRN Narda BondsNettey, Ralph A, MD   4 mg at 03/25/19 0934  . pantoprazole (PROTONIX) injection 40 mg  40 mg Intravenous Q12H Narda BondsNettey, Ralph A, MD   40 mg at 03/25/19 0919  . [START ON 03/26/2019] pneumococcal 23 valent vaccine (PNU-IMMUNE) injection 0.5 mL  0.5 mL Intramuscular Tomorrow-1000 Ghimire, Kuber, MD      . sodium bicarbonate 150 mEq in sterile water 1,000 mL infusion   Intravenous Continuous Delano MetzSchertz, De Libman, MD 125 mL/hr at 03/25/19 1200    . traZODone (DESYREL) tablet 100 mg  100 mg Oral QHS Narda BondsNettey, Ralph A, MD   100 mg at 03/24/19 2138    Allergies as of 03/24/2019  . (No Known Allergies)    Family History  Problem Relation Age of Onset  . Kidney disease Neg Hx     Social History   Socioeconomic History  . Marital status: Divorced    Spouse name: Not on file  . Number of children: Not on file  . Years of education: Not on file  . Highest education level: Not on file  Occupational History  . Not on file  Social Needs  . Financial resource strain: Not on file  . Food insecurity:    Worry: Not on file    Inability: Not on file  . Transportation needs:    Medical: Not on file    Non-medical: Not on file  Tobacco Use  . Smoking status: Never Smoker  . Smokeless tobacco: Never Used  Substance and Sexual Activity  . Alcohol use: Yes    Alcohol/week: 21.0 standard drinks    Types: 21 Glasses of wine per week    Comment: sometimes   . Drug use: No  . Sexual activity: Yes    Partners: Male    Birth control/protection: Condom  Lifestyle  . Physical activity:    Days per week: Not on file    Minutes per session: Not on file  . Stress: Not on file  Relationships  . Social connections:    Talks on phone: Not on file    Gets together: Not on file    Attends religious service: Not on file    Active member of club or organization: Not on file    Attends meetings of clubs or organizations: Not on file    Relationship status: Not on file  . Intimate  partner violence:    Fear of current or ex partner: Not on file    Emotionally abused: Not on file    Physically abused: Not on file    Forced sexual activity: Not on file  Other Topics Concern  . Not on file  Social History Narrative  . Not on file    Review of Systems: Not obtainable in a reliable fashion because of the patient's mental status  Physical Exam: Vital signs in last 24 hours: Temp:  [97.4 F (36.3 C)-98.8 F (37.1 C)] 97.9 F (36.6 C) (05/29 1200) Pulse Rate:  [99-117] 116 (05/29 1330) Resp:  [14-25] 19 (05/29 1330) BP: (57-110)/(36-84) 110/52 (05/29 1330) SpO2:  [90 %-99 %] 94 % (05/29 1330) Weight:  [99.5 kg] 99.5 kg (05/29 0500) Last BM Date: 03/25/19 General:   Alert but somewhat somulent,  Well-developed, well-nourished, pleasant and cooperative in NAD Head:  Normocephalic and atraumatic. Eyes:  Sclera clear, no icterus.   Conjunctiva pink. Lungs:  Clear throughout to auscultation.  No evident respiratory distress. Heart:   Normal rate, regular rhythm, 2/6 systolic flow murmur. Abdomen: Slightly protuberant, quiet bowel sounds, mild subjective tenderness without guarding or peritoneal findings. Msk:   Symmetrical without gross deformities. Extremities:   Without clubbing, cyanosis, or edema. Neurologic: Somnolent, but no focal neurologic deficits Skin:  Intact without significant lesions or rashes. Psych:   Alert and cooperative. Normal mood and affect.  Intake/Output from previous day: 05/28 0701 - 05/29 0700 In: 1238.5 [I.V.:1238.5] Out: 675 [Urine:675] Intake/Output this shift: Total I/O In: 958.9 [I.V.:958.9] Out: 325 [Urine:325]  Lab Results: Recent Labs    03/24/19 1309 03/25/19 0300  WBC 11.6* 9.9  HGB 14.6 12.4  HCT 46.2* 38.8  PLT 200 105*   BMET Recent Labs    03/24/19 1309 03/25/19 0300  NA 139 139  K 4.4 4.7  CL 95* 111  CO2 21* 13*  GLUCOSE 165* 88  BUN 80* 72*  CREATININE 5.04* 3.89*  CALCIUM 9.4 7.3*    LFT Recent Labs    03/24/19 1309  PROT 8.8*  ALBUMIN 4.4  AST 35  ALT 20  ALKPHOS 93  BILITOT 1.6*  BILIDIR 0.3*  IBILI 1.3*   PT/INR Recent Labs    03/24/19 1309  LABPROT 13.5  INR 1.0    Studies/Results: [place] Renal  Result Date: 03/24/2019 CLINICAL DATA:  Chronic kidney disease, stage III EXAM: RENAL / URINARY TRACT ULTRASOUND COMPLETE COMPARISON:  CT scan 03/19/2017 FINDINGS: Right Kidney: Renal measurements: 10.6 x 3.7 x 5.0 cm = volume: 104.7 mL. Slight increased renal echogenicity suggesting medical renal disease. Mild renal cortical thinning. No focal lesions or hydronephrosis. Left Kidney: Renal measurements: 10.6 x 5.4 x 5.0 cm = volume: 151.1 mL. Slight increased renal echogenicity suggesting medical renal disease. Mild renal cortical thinning. No worrisome renal lesions. No hydronephrosis. Bladder: Appears normal for degree of bladder distention. Other: Incidental gallstone noted the gallbladder measuring 1.7 cm. IMPRESSION: 1. Slight increased renal echogenicity and slight renal cortical thinning consistent with medical renal disease. 2. No worrisome renal lesions or hydronephrosis. 3. Incidental cholelithiasis. Electronically Signed   By: Rudie MeyerP.  Gallerani M.D.   On: 03/24/2019 18:45   Dg Abd Portable 1v  Result Date: 03/24/2019 CLINICAL DATA:  Abdominal pain.  Diverticulitis. EXAM: PORTABLE ABDOMEN - 1 VIEW COMPARISON:  08/10/2017. FINDINGS: There is a large amount of stool in the colon. There is some gaseous distension of both the colon and several small bowel loops. Phleboliths project over the patient's pelvis. There is no frank evidence of a high-grade small bowel obstruction, however the entirety abdomen is not visualized on this exam. There is a linear lucency coursing through the patient's pelvis that is favored to represent artifact. IMPRESSION: 1. Large amount of stool throughout the colon. 2. Mild gaseous distention of loops of colon and small bowel without evidence  of a high-grade obstruction. 3. Linear lucency coursing through the pelvis favored to represent artifact, less likely free air. If there is high clinical suspicion for an acute intra-abdominal process short interval follow-up radiograph is recommended to confirm resolution of this finding. Electronically Signed   By: Katherine Mantlehristopher  Green M.D.   On: 03/24/2019 21:43   [place] Ekg Site Rite  Result Date: 03/24/2019 If Site Rite image not attached, placement could not be confirmed due to current cardiac rhythm.   Impression: 1.  Reported coffee-ground emesis and heme positive non-melenic stool without evidence of clinically overt GI bleeding.  Could be stress gastritis, uremic gastritis, or NSAID gastropathy. 2.  Acute on chronic renal insufficiency, improving with hydration, with metabolic acidosis 3.  Low blood pressure with evidence of volume contraction 4.  Borderline mental status, perhaps due to low blood pressure and/or acute renal insufficiency 4.  History of abdominal pain and vomiting, apparently chronic  Plan: At present, the patient's main clinical problem appears to be dehydration and associated metabolic disarray.  Once the patient is more stable from the medical perspective, GI evaluation with a CT scan for evaluation of her abdominal pain and vomiting, and possibly upper endoscopy to look for evidence of ulcer disease from her NSAID exposure, might be appropriate.    However, the patient has a benign abdomen with no fever and no leukocytosis, so I do not think urgent CT scanning is needed today, and similarly, there is no evidence of active GI bleeding to  necessitate urgent endoscopic evaluation.  Dr. Levora Angel is covering for [place] this weekend and can reassess the patient tomorrow to determine whether it is an appropriate time to proceed with the above-mentioned testing.  In the meantime, please feel free to call us if you would like to discuss the patient's case.   LOS: 1 day   Katy Fitch Davian Hanshaw  03/25/2019, 2:03 PM   Pager [phone] If no answer or after 5 PM call [phone]

## 2019-03-26 ENCOUNTER — Inpatient Hospital Stay (HOSPITAL_COMMUNITY): Payer: Medicare Other

## 2019-03-26 LAB — C DIFFICILE QUICK SCREEN W PCR REFLEX
C Diff antigen: NEGATIVE
C Diff interpretation: NOT DETECTED
C Diff toxin: NEGATIVE

## 2019-03-26 LAB — TROPONIN I
Troponin I: 0.03 ng/mL (ref <0.03)
Troponin I: <0.03 ng/mL (ref <0.03)

## 2019-03-26 LAB — CBC
HCT: 30.5 % — ABNORMAL LOW (ref 36.0–46.0)
Hemoglobin: 9.7 g/dL — ABNORMAL LOW (ref 12.0–15.0)
MCH: 30.8 pg (ref 26.0–34.0)
MCHC: 31.8 g/dL (ref 30.0–36.0)
MCV: 96.8 fL (ref 80.0–100.0)
Platelets: 130 10*3/uL — ABNORMAL LOW (ref 150–400)
RBC: 3.15 MIL/uL — ABNORMAL LOW (ref 3.87–5.11)
RDW: 15 % (ref 11.5–15.5)
WBC: 12.6 10*3/uL — ABNORMAL HIGH (ref 4.0–10.5)
nRBC: 0 % (ref 0.0–0.2)

## 2019-03-26 LAB — BASIC METABOLIC PANEL
Anion gap: 10 (ref 5–15)
BUN: 70 mg/dL — ABNORMAL HIGH (ref 8–23)
CO2: 20 mmol/L — ABNORMAL LOW (ref 22–32)
Calcium: 6.5 mg/dL — ABNORMAL LOW (ref 8.9–10.3)
Chloride: 110 mmol/L (ref 98–111)
Creatinine, Ser: 2.85 mg/dL — ABNORMAL HIGH (ref 0.44–1.00)
GFR calc Af Amer: 19 mL/min — ABNORMAL LOW (ref >60)
GFR calc non Af Amer: 17 mL/min — ABNORMAL LOW (ref >60)
Glucose, Bld: 93 mg/dL (ref 70–99)
Potassium: 4.2 mmol/L (ref 3.5–5.1)
Sodium: 140 mmol/L (ref 135–145)

## 2019-03-26 LAB — PHOSPHORUS: Phosphorus: 5 mg/dL — ABNORMAL HIGH (ref 2.5–4.6)

## 2019-03-26 LAB — MAGNESIUM: Magnesium: 2 mg/dL (ref 1.7–2.4)

## 2019-03-26 MED ORDER — DILTIAZEM HCL 100 MG IV SOLR
5.0000 mg/h | INTRAVENOUS | Status: DC
  Administered 2019-03-26 (×2): 5 mg/h via INTRAVENOUS
  Filled 2019-03-26 (×3): qty 100

## 2019-03-26 MED ORDER — PIPERACILLIN-TAZOBACTAM 3.375 G IVPB
3.3750 g | Freq: Three times a day (TID) | INTRAVENOUS | Status: DC
  Administered 2019-03-26 – 2019-04-02 (×19): 3.375 g via INTRAVENOUS
  Filled 2019-03-26 (×17): qty 50

## 2019-03-26 MED ORDER — DILTIAZEM HCL 25 MG/5ML IV SOLN
10.0000 mg | Freq: Once | INTRAVENOUS | Status: AC
  Administered 2019-03-26: 10 mg via INTRAVENOUS
  Filled 2019-03-26 (×2): qty 5

## 2019-03-26 MED ORDER — ADENOSINE 6 MG/2ML IV SOLN
6.0000 mg | Freq: Once | INTRAVENOUS | Status: AC
  Administered 2019-03-26: 6 mg via INTRAVENOUS

## 2019-03-26 MED ORDER — METOPROLOL TARTRATE 25 MG PO TABS: 50.0000 mg | ORAL_TABLET | Freq: Two times a day (BID) | ORAL | Status: DC

## 2019-03-26 MED ORDER — SODIUM CHLORIDE 0.9 % IV SOLN
2.0000 g | Freq: Every day | INTRAVENOUS | Status: DC
  Filled 2019-03-26: qty 2

## 2019-03-26 MED ORDER — LORAZEPAM 2 MG/ML IJ SOLN
INTRAMUSCULAR | Status: AC
  Administered 2019-03-26: 1 mg
  Filled 2019-03-26: qty 1

## 2019-03-26 MED ORDER — PHENYLEPHRINE HCL-NACL 40-0.9 MG/250ML-% IV SOLN
0.0000 ug/min | INTRAVENOUS | Status: DC
  Administered 2019-03-26: 200 ug/min via INTRAVENOUS
  Administered 2019-03-26: 20 ug/min via INTRAVENOUS
  Filled 2019-03-26 (×3): qty 250

## 2019-03-26 MED ORDER — DILTIAZEM LOAD VIA INFUSION: 10.0000 mg | Freq: Once | INTRAVENOUS | Status: DC

## 2019-03-26 MED ORDER — AMIODARONE IV BOLUS ONLY 150 MG/100ML
150.0000 mg | Freq: Once | INTRAVENOUS | Status: AC
  Administered 2019-03-26: 150 mg via INTRAVENOUS

## 2019-03-26 MED ORDER — METOPROLOL TARTRATE 50 MG PO TABS
100.0000 mg | ORAL_TABLET | Freq: Two times a day (BID) | ORAL | Status: DC
  Administered 2019-03-26 – 2019-04-04 (×16): 100 mg via ORAL
  Filled 2019-03-26 (×2): qty 4
  Filled 2019-03-26: qty 2
  Filled 2019-03-26: qty 4
  Filled 2019-03-26 (×5): qty 2
  Filled 2019-03-26 (×2): qty 4
  Filled 2019-03-26 (×2): qty 2
  Filled 2019-03-26 (×2): qty 4
  Filled 2019-03-26: qty 2
  Filled 2019-03-26 (×2): qty 4

## 2019-03-26 MED ORDER — PIPERACILLIN-TAZOBACTAM 3.375 G IVPB 30 MIN
3.3750 g | Freq: Once | INTRAVENOUS | Status: AC
  Administered 2019-03-26: 3.375 g via INTRAVENOUS
  Filled 2019-03-26: qty 50

## 2019-03-26 MED FILL — Medication: Qty: 1 | Status: AC

## 2019-03-26 NOTE — Progress Notes (Signed)
eLink Physician-Brief Progress Note Patient Name: Michelle Graves DOB: 08/18/1951 MRN: [id number]   Date of Service  03/26/2019  HPI/Events of Note  Called into room by bedside nurse for SVT rates in the 170s to 180s BP sensitive.  Limited code with no CV but drugs allowed.  Given one 6mg  dose of adenosine with pause and gradual increase in rate back to the 170s.  150 mg amiodarone IV times one with reduction in HR to 115 with improvement in BP to 100/53 (66)  eICU Interventions  Plan: 12 lead EKG Trop Hold on additional drugs for now Consider cardiology consult.     Intervention Category Major Interventions: Arrhythmia - evaluation and management  DETERDING,ELIZABETH 03/26/2019, 6:12 AM

## 2019-03-26 NOTE — Progress Notes (Signed)
PHARMACY NOTE -  Zosyn  Pharmacy has been assisting with dosing of Zosyn for intra-abdominal infection.  Zosyn 3.375 g IV given once over 30 minutes, then every 8 hrs by 4-hr infusion  Pharmacy will sign off, following peripherally for culture results or dose adjustments. Please reconsult if a change in clinical status warrants re-evaluation of dosage.  Bernadene Person, PharmD, BCPS [phone] 03/26/2019, 10:08 AM

## 2019-03-26 NOTE — Progress Notes (Signed)
Was called to bedside  Patient pulled out central line  Attempt to replace line over guidewire aborted as line was twisted  Central line removed  Pressure held over site  Dressing placed  Patient needs a central line for administration of vasoactive medications

## 2019-03-26 NOTE — Procedures (Signed)
Central Venous Catheter Insertion Procedure Note Michelle Graves [id number] July 31, 1951  Procedure: Insertion of Central Venous Catheter Indications: Drug and/or fluid administration  Procedure Details Consent: Unable to obtain consent because of emergent medical necessity. Time Out: Verified patient identification, verified procedure, site/side was marked, verified correct patient position, special equipment/implants available, medications/allergies/relevent history reviewed, required imaging and test results available.  Performed  Maximum sterile technique was used including antiseptics, cap, gloves, gown, hand hygiene, mask and sheet. Skin prep: Chlorhexidine; local anesthetic administered A antimicrobial bonded/coated triple lumen catheter was placed in the left internal jugular vein using the Seldinger technique.  Evaluation Blood flow good Complications: No apparent complications Patient did tolerate procedure well. Chest X-ray ordered to verify placement.  CXR: pending.  Adewale A Olalere 03/26/2019, 2:32 PM

## 2019-03-26 NOTE — Progress Notes (Signed)
[facility] Gastroenterology Progress Note  CLYTA ANTONICH 68 y.o. 1950/10/31  CC: GI bleed/colitis   Subjective: Patient is somewhat upset this morning.  Having diarrhea with loose stools this morning.  Denied any further bleeding.  Discussed with the nursing staff.  No evidence of bleeding since last night.  ROS : Negative for chest pain.  Mild shortness of breath.  Negative for fever   Objective: Vital signs in last 24 hours: Vitals:   03/26/19 0810 03/26/19 0916  BP:  108/60  Pulse:  (!) 160  Resp:    Temp: 98.4 F (36.9 C)   SpO2:      Physical Exam:  General:  Alert, cooperative, no distress, appears stated age  Head:  Normocephalic, without obvious abnormality, atraumatic  Eyes:  , EOM's intact,   Lungs:   Clear to auscultation bilaterally, respirations unlabored  Heart:  Regular rate and rhythm, tachycardia  Abdomen:    Mild distended, nontender, bowel sounds present.  No peritoneal signs  Extremities: Extremities normal, atraumatic, no  edema       Lab Results: Recent Labs    03/25/19 1616 03/26/19 0405  NA 136 140  K 4.5 4.2  CL 105 110  CO2 20* 20*  GLUCOSE 79 93  BUN 77* 70*  CREATININE 3.12* 2.85*  CALCIUM 6.6* 6.5*  MG  --  2.0  PHOS  --  5.0*   Recent Labs    03/24/19 1309 03/25/19 1616  AST 35 29  ALT 20 16  ALKPHOS 93 64  BILITOT 1.6* 0.9  PROT 8.8* 5.4*  ALBUMIN 4.4 2.4*   Recent Labs    03/24/19 1309  03/25/19 1616 03/26/19 0405  WBC 11.6*   < > 10.7* 12.6*  NEUTROABS 10.2*  --  9.1*  --   HGB 14.6   < > 10.4* 9.7*  HCT 46.2*   < > 32.5* 30.5*  MCV 98.1   < > 96.2 96.8  PLT 200   < > 137* 130*   < > = values in this interval not displayed.   Recent Labs    03/24/19 1309  LABPROT 13.5  INR 1.0      Assessment/Plan: -Coffee-ground emesis.  Resolved.  No further bleeding episodes -Acute blood loss anemia.  Hemoglobin down to 9.7.  No further bleeding episodes. -Abnormal CT scan showing colitis.  Patient currently  having diarrhea. -Tachycardia/SVT  Recommendations ------------------------ -No further bleeding episodes. -Stool for C. difficile collected.  Report pending. -Given worsening leukocytosis and ongoing tachycardia and CT scan showing colitis, I think it is reasonable to start antibiotics.  We will start with Zosyn.  Consider changing antibiotics depending on C. difficile results. -Hold off on EGD for now given lack of further bleeding -Continue PPI.  Monitor H&H. -GI will follow  Kathi Der MD, FACP 03/26/2019, 9:54 AM  Contact #  [phone]

## 2019-03-26 NOTE — Progress Notes (Signed)
NAME:  Michelle Graves, MRN:  [id number], DOB:  21-Feb-1951, LOS: 2 ADMISSION DATE:  03/24/2019, CONSULTATION DATE:  03/24/2019 REFERRING MD:  Jerral Ralph CHIEF COMPLAINT:  Hypotension, shock   Brief History   Patient was admitted with dark emesis/stool  History of present illness   68 year old history of hypertension, chronic kidney disease stage III, rheumatoid arthritis Presented with black emesis and still, abdominal pain 3 days duration  Past Medical History   Past Medical History:  Diagnosis Date  . Arthritis   . Chest pain   . Diverticulitis   . Hypertension    Significant Hospital Events   Persistent hypotension despite fluid resuscitation  Consults:  PCCM 03/25/2019  Procedures:  Central line placement 5/29 Arterial line placement 5/29  Significant Diagnostic Tests:  Abdominal x-ray IMPRESSION: 1. Large amount of stool throughout the colon. 2. Mild gaseous distention of loops of colon and small bowel without evidence of a high-grade obstruction. 3. Linear lucency coursing through the pelvis favored to represent artifact, less likely free air. If there is high clinical suspicion for an acute intra-abdominal process short interval follow-up radiograph is recommended to confirm resolution of this finding.  Abdominal CT IMPRESSION: 1. Mild-to-moderate colitis involving the distal transverse, descending, and rectosigmoid colon. 2. No evidence of abscess or bowel obstruction. 3. Bilateral pleural effusions and lower lobe atelectasis versus infiltrates, left side greater than right.  Micro Data:  Blood cultures 03/25/2019>> MRSA by PCR negative  Antimicrobials:    Interim history/subjective:  Weaned off pressors Runs of SVT  Objective   Blood pressure 120/68, pulse (!) 112, temperature 98.4 F (36.9 C), temperature source Oral, resp. rate 20, height 5\' 7"  (1.702 m), weight 100 kg, SpO2 91 %. CVP:  [11 mmHg-15 mmHg] 14 mmHg      Intake/Output Summary  (Last 24 hours) at 03/26/2019 8088 Last data filed at 03/26/2019 0805 Gross per 24 hour  Intake 6061.22 ml  Output 865 ml  Net 5196.22 ml   Filed Weights   03/25/19 0500 03/26/19 0401  Weight: 99.5 kg 100 kg   Examination: General: Middle-aged lady, more interactive today HENT: Moist oral mucosa Lungs: Fair air entry bilaterally, rales at the bases Cardiovascular: S1-S2 appreciated Abdomen: Soft, bowel sounds appreciated Extremities: No edema, no clubbing Neuro: Mental status alert but better today GU: Fair urine output  Resolved Hospital Problem list     Assessment & Plan:  Hypotension -Fluid resuscitation -Required pressors, successfully weaned off this morning  Reported coffee-ground emesis -Defer to GI -No evidence of GI bleeding since his been in hospital -Guaiac positive but stools have been brown  Acute kidney injury on chronic kidney disease -Prerenal -Improving with resuscitation  Acute encephalopathy -Likely multifactorial -Concern for sepsis with unclear source -No fevers, no leukocytosis -Follow up on blood cultures  Continue to hold antihypertensives  Lactic acidosis -Resolved with fluid resuscitation  History of rheumatoid arthritis -Monitor closely -On hydroxychloroquine -On Enbrel and prednisone at home  Diarrhea -C. difficile will be sent -Isolation  Supraventricular tachycardia -Will start on diltiazem  Anemia -Dilutional  Though she is not mounting leukocytosis or fever There is concern for sepsis, source of infection is unclear at present Abdominal CT does reveal atelectasis, left hilar fullness  Will obtain CT scan of the chest Start on cefepime-renal adjusted doses  Discussed with Dr. Jerral Ralph  Best practice:   VAP protocol (if indicated): Not indicated DVT prophylaxis: SCD GI prophylaxis: Protonix Mobility: Bedrest Code Status: DNR  Remains in ICU  Labs  CBC: Recent Labs  Lab 03/24/19 1309 03/25/19 0300  03/25/19 1616 03/26/19 0405  WBC 11.6* 9.9 10.7* 12.6*  NEUTROABS 10.2*  --  9.1*  --   HGB 14.6 12.4 10.4* 9.7*  HCT 46.2* 38.8 32.5* 30.5*  MCV 98.1 96.0 96.2 96.8  PLT 200 105* 137* 130*    Basic Metabolic Panel: Recent Labs  Lab 03/24/19 1309 03/25/19 0300 03/25/19 1616 03/26/19 0405  NA 139 139 136 140  K 4.4 4.7 4.5 4.2  CL 95* 111 105 110  CO2 21* 13* 20* 20*  GLUCOSE 165* 88 79 93  BUN 80* 72* 77* 70*  CREATININE 5.04* 3.89* 3.12* 2.85*  CALCIUM 9.4 7.3* 6.6* 6.5*  MG  --   --   --  2.0  PHOS  --   --   --  5.0*   GFR: Estimated Creatinine Clearance: 23.6 mL/min (A) (by C-G formula based on SCr of 2.85 mg/dL (H)). Recent Labs  Lab 03/24/19 1309 03/24/19 1821 03/24/19 2255 03/25/19 0300 03/25/19 1616 03/26/19 0405  WBC 11.6*  --   --  9.9 10.7* 12.6*  LATICACIDVEN  --  6.6* 5.8*  --  1.8  --     Liver Function Tests: Recent Labs  Lab 03/24/19 1309 03/25/19 1616  AST 35 29  ALT 20 16  ALKPHOS 93 64  BILITOT 1.6* 0.9  PROT 8.8* 5.4*  ALBUMIN 4.4 2.4*   No results for input(s): LIPASE, AMYLASE in the last 168 hours. Recent Labs  Lab 03/25/19 1747  AMMONIA 27    ABG    Component Value Date/Time   PHART 7.398 03/25/2019 1808   PCO2ART 31.1 (L) 03/25/2019 1808   PO2ART 63.8 (L) 03/25/2019 1808   HCO3 18.8 (L) 03/25/2019 1808   TCO2 30 12/18/2008 0927   ACIDBASEDEF 4.8 (H) 03/25/2019 1808   O2SAT 91.7 03/25/2019 1808     Coagulation Profile: Recent Labs  Lab 03/24/19 1309  INR 1.0    Cardiac Enzymes: Recent Labs  Lab 03/26/19 0612  TROPONINI 0.03*   HbA1C: No results found for: HGBA1C  CBG: Recent Labs  Lab 03/24/19 1248  GLUCAP 158*    Review of Systems:   Review of Systems  Constitutional: Positive for malaise/fatigue.  HENT: Negative.   Eyes: Negative.   Cardiovascular: Negative for chest pain.  Skin: Negative for rash.   Past Medical History  She,  has a past medical history of Arthritis, Chest pain,  Diverticulitis, and Hypertension.   Surgical History    Past Surgical History:  Procedure Laterality Date  . TONSILLECTOMY       Social History   reports that she has never smoked. She has never used smokeless tobacco. She reports current alcohol use of about 21.0 standard drinks of alcohol per week. She reports that she does not use drugs.   Family History   Her family history is negative for Kidney disease.   Allergies No Known Allergies   The patient is critically ill with multiple organ system failure and requires high complexity decision making for assessment and support, frequent evaluation and titration of therapies, advanced monitoring, review of radiographic studies and interpretation of complex data.    Critical Care Time devoted to patient care services, exclusive of separately billable procedures, described in this note is 30 minutes.  Virl Diamond, MD [place], PCCM Cell: [phone]

## 2019-03-26 NOTE — Progress Notes (Signed)
[facility] Progress Note  Subjective: creat down 2.8 today, 800 UOP yesterday. BP's labile, off and on pressors x 1. Some sVT as well. CT abd showed colitis of distal transverse/ rectosigmoid and rectal areas.  Cdif neg.   Vitals:   03/26/19 1200 03/26/19 1230 03/26/19 1300 03/26/19 1555  BP: 95/68 106/86 (!) 101/46   Pulse:      Resp: (!) 24 (!) 22 (!) 22   Temp: 97.8 F (36.6 C)   98.6 F (37 C)  TempSrc: Oral   Oral  SpO2: 92%     Weight:      Height:        Inpatient medications: . Chlorhexidine Gluconate Cloth  6 each Topical Daily  . hydroxychloroquine  200 mg Oral BID  . mouth rinse  15 mL Mouth Rinse BID  . metoprolol tartrate  100 mg Oral BID  . pantoprazole (PROTONIX) IV  40 mg Intravenous Q12H  . pneumococcal 23 valent vaccine  0.5 mL Intramuscular Tomorrow-1000   . diltiazem (CARDIZEM) infusion 5 mg/hr (03/26/19 1111)  . phenylephrine (NEO-SYNEPHRINE) Adult infusion 20 mcg/min (03/26/19 1458)  . piperacillin-tazobactam (ZOSYN)  IV 3.375 g (03/26/19 1514)  .  sodium bicarbonate (isotonic) infusion in sterile water 125 mL/hr at 03/26/19 1111   acetaminophen **OR** acetaminophen, HYDROcodone-acetaminophen, morphine injection, ondansetron **OR** ondansetron (ZOFRAN) IV    Exam: Gen awake, chron ill appearing No jvd or bruits Chest clear bilat to bases no rales or wheezing RRR no MRG Abd soft ntnd no mass or ascites +bs Ext no sig LE edema  Neuro is alert, Ox 3 , nf    Home meds:  - amlodipine 10/ clonidine 0.3 qd/ hydrochlorothiazide 25 qd/ lisinopril 20 qd/ metoprolol 100 bid/ Kdur 40 qd  - prednisone 20 qd/ enbrel 50 mg weekly/ hydroxychloroquine 200 bid  - trazodone 100 qd  - omeprazole 20 bid/ terbinafine 250 qd  - aspirin 81   - prn naproxen 500 qd  - prn's/ vitamins/ supplements   UA > pending UNa 48 , UCr 191    Renal US > IMPRESSION: 1. Slight increased renal echogenicity and slight renal cortical thinning consistent with  medical renal disease. 2. No worrisome renal lesions or hydronephrosis. 3. Incidental cholelithiasis.  Assessment: 1. AKI on CKD 3 - in patinet w/ GI illness (N/V/D), CKD 1.2- 1.6 and ACEi/ nsaid Rx.  Pt quite dehydrated at time of admission.  Creat continues to improved. Renal US chronic changes / no hydro, and UA still pending, will reorder. Will follow.  2. Sepsis - unclear cause, CCM managing and [facility], getting some pressor support and IV abx w Zosyn 3. Vol depletion - improved after multiple liters of fluid 4. N/V / diarrhea 5. RA - on prednisone and Enbrel, immunosuppresed 6. Obesity 7. HTN - holding all meds      [employer] [facility] 03/26/2019, 6:00 PM  Iron/TIBC/Ferritin/ %Sat    Component Value Date/Time   IRON 61 08/29/2016 0224   TIBC 227 (L) 08/29/2016 0224   FERRITIN 555 (H) 08/29/2016 0224   IRONPCTSAT 27 08/29/2016 0224   Recent Labs  Lab 03/24/19 1309  03/25/19 1616 03/26/19 0405  NA 139   < > 136 140  K 4.4   < > 4.5 4.2  CL 95*   < > 105 110  CO2 21*   < > 20* 20*  GLUCOSE 165*   < > 79 93  BUN 80*   < > 77* 70*  CREATININE  5.04*   < > 3.12* 2.85*  CALCIUM 9.4   < > 6.6* 6.5*  PHOS  --   --   --  5.0*  ALBUMIN 4.4  --  2.4*  --   INR 1.0  --   --   --    < > = values in this interval not displayed.   Recent Labs  Lab 03/25/19 1616  AST 29  ALT 16  ALKPHOS 64  BILITOT 0.9  PROT 5.4*   Recent Labs  Lab 03/26/19 0405  WBC 12.6*  HGB 9.7*  HCT 30.5*  PLT 130*

## 2019-03-26 NOTE — Progress Notes (Signed)
Pt HR went into SVT in the 170s-180s. MD came to bedside and ordered diltiazem push. HR still remained in the 160s and 100 of oral metoprolol was given. HR remained in the 140s and MD ordered a diltiazem drip. Diltiazem drip started. Will continue to monitor.

## 2019-03-26 NOTE — Progress Notes (Signed)
PROGRESS NOTE    Michelle MallingBarbara J Bibbee  RUE:[id number]:[id number] DOB: 01/15/1951 DOA: 03/24/2019 PCP: [facility]    Brief Narrative:  Patient is 68 year old female with history of hypertension, stage III CKD with reported baseline creatinine of 1.6, rheumatoid arthritis who presented to the hospital with nausea, black emesis and abdominal pain for about 3 days.  In the emergency room, patient was afebrile, heart rate 100, respiratory 20, blood pressures 80/50.  Creatinine of 5.04 and bilirubin of 1.6.  Hemoglobin was stable at 14.  She was given Protonix, fluid boluses and admitted for acute renal failure.  03/25/2019: Persistent low blood pressures, low perfusion and urine output and poor venous access. Transfer to ICU, central line and arterial line placed, further resuscitation with fluid with improvement.  Also received Neo-Synephrine overnight. CT scan shows diffuse colitis.  Patient had 2 loose bowel movement overnight.  No recent antibiotic use. Level of tachycardia.   Assessment & Plan:   Principal Problem:   Acute kidney injury superimposed on CKD (HCC) Active Problems:   HYPERTENSION, MALIGNANT ESSENTIAL   KIDNEY DISEASE, CHRONIC, STAGE III   Hypotension   Dark emesis   Dark stools  Acute kidney injury with history of hypertensive kidney disease stage III: Probably combined cause.  Severe dehydration.  Also with use of NSAID'sAnd ongoing use of ACE inhibitor. Treated with IV fluid boluses.  Holding all antihypertensive medications.  Pressures stabilizing today.  Renal functions improving. Urine output about thousand mL last 24 hours.  No evidence of obvious uremia. Continue generous IV fluid.  Followed by nephrology. Continue strict intake and output monitoring, will need to continue Foley catheter for strict monitoring. Renal ultrasound shows chronic renal disease, no hydronephrosis or obstruction.  Hypovolemic shock with nausea, vomiting with  coffee-ground emesis: Hb base line around 12-9.7. Probably peptic ulcer disease.  Aggravated by use of NSAID's. Hemoglobin has remained stable with some drop with hemodilution.  No evidence of ongoing bleed.  Remains on Protonix twice daily. Will be seen by gastroenterology.  Will benefit with endoscopic evaluation at some point. CT scan without contrast showed transverse and descending colon colitis.  C. difficile sent. No recent antibiotic use.  Hold off on starting any antibiotics. Blood cultures with no growth so far.  Lactic acidosis: Probably due to severe dehydration.  No evidence of localizing infection.  Normalized.  SVT: Patient developed SVT overnight.  Given amiodarone with recurrence.  Started on Cardizem drip.  Blood pressures are adequate.  Will resume her home metoprolol.  Essential hypertension:  Holding amlodipine, clonidine, hydrochlorothiazide and lisinopril as well as metoprolol.  Rheumatoid arthritis:  On Plaquenil that we will continue.  Recently completed a steroid burst.  Avoid NSAID's.   DVT prophylaxis: SCDs Code Status: DNR Family Communication: We will communicate with family, patient's daughter informed and updated. Disposition Plan: Inpatient stepdown   Consultants:   Nephrology  GI  Procedures:   CvC and arterial line  Antimicrobials:   None   Subjective: Patient seen and examined.  Overnight events noted.  In the evening she was started on Neo-Synephrine for persistent low blood pressure.  At nighttime she had developed sinus tachycardia and SVT, given 1 dose of adenosine and amiodarone.  Remains personally tachycardic. Patient herself is more awake and alert today.  She does complain of mild diffuse abdominal pain.  2 episodes of loose watery stool overnight. Currently blood pressures are stable without vasopressors.  Objective: Vitals:   03/26/19 0500 03/26/19 0600 03/26/19 0700  03/26/19 0810  BP:   120/68   Pulse:      Resp: 18  (!) 21 20   Temp:    98.4 F (36.9 C)  TempSrc:    Oral  SpO2:      Weight:      Height:        Intake/Output Summary (Last 24 hours) at 03/26/2019 0837 Last data filed at 03/26/2019 0805 Gross per 24 hour  Intake 6061.22 ml  Output 865 ml  Net 5196.22 ml   Filed Weights   03/25/19 0500 03/26/19 0401  Weight: 99.5 kg 100 kg    Examination:  General exam: Appears anxious, however more comfortable today.  Sick looking.  Mucous membranes moist today. Marland Kitchen Respiratory system: Clear to auscultation. Respiratory effort normal. Cardiovascular system: S1 & S2 heard, RRR. No JVD, murmurs, rubs, gallops or clicks. No pedal edema. Gastrointestinal system: Abdomen is nondistended, soft and mildly tender all over the abdomen.  No rigidity or guarding.  No organomegaly or masses felt. Normal bowel sounds heard. Central nervous system: Alert and oriented. No focal neurological deficits. Extremities: Symmetric 5 x 5 power. Skin: No rashes, lesions or ulcers Psychiatry: Judgement and insight appear normal. Mood & affect anxious.    Data Reviewed: I have personally reviewed following labs and imaging studies  CBC: Recent Labs  Lab 03/24/19 1309 03/25/19 0300 03/25/19 1616 03/26/19 0405  WBC 11.6* 9.9 10.7* 12.6*  NEUTROABS 10.2*  --  9.1*  --   HGB 14.6 12.4 10.4* 9.7*  HCT 46.2* 38.8 32.5* 30.5*  MCV 98.1 96.0 96.2 96.8  PLT 200 105* 137* 130*   Basic Metabolic Panel: Recent Labs  Lab 03/24/19 1309 03/25/19 0300 03/25/19 1616 03/26/19 0405  NA 139 139 136 140  K 4.4 4.7 4.5 4.2  CL 95* 111 105 110  CO2 21* 13* 20* 20*  GLUCOSE 165* 88 79 93  BUN 80* 72* 77* 70*  CREATININE 5.04* 3.89* 3.12* 2.85*  CALCIUM 9.4 7.3* 6.6* 6.5*  MG  --   --   --  2.0  PHOS  --   --   --  5.0*   GFR: Estimated Creatinine Clearance: 23.6 mL/min (A) (by C-G formula based on SCr of 2.85 mg/dL (H)). Liver Function Tests: Recent Labs  Lab 03/24/19 1309 03/25/19 1616  AST 35 29  ALT 20 16   ALKPHOS 93 64  BILITOT 1.6* 0.9  PROT 8.8* 5.4*  ALBUMIN 4.4 2.4*   No results for input(s): LIPASE, AMYLASE in the last 168 hours. Recent Labs  Lab 03/25/19 1747  AMMONIA 27   Coagulation Profile: Recent Labs  Lab 03/24/19 1309  INR 1.0   Cardiac Enzymes: Recent Labs  Lab 03/26/19 0612  TROPONINI 0.03*   BNP (last 3 results) No results for input(s): PROBNP in the last 8760 hours. HbA1C: No results for input(s): HGBA1C in the last 72 hours. CBG: Recent Labs  Lab 03/24/19 1248  GLUCAP 158*   Lipid Profile: No results for input(s): CHOL, HDL, LDLCALC, TRIG, CHOLHDL, LDLDIRECT in the last 72 hours. Thyroid Function Tests: No results for input(s): TSH, T4TOTAL, FREET4, T3FREE, THYROIDAB in the last 72 hours. Anemia Panel: No results for input(s): VITAMINB12, FOLATE, FERRITIN, TIBC, IRON, RETICCTPCT in the last 72 hours. Sepsis Labs: Recent Labs  Lab 03/24/19 1821 03/24/19 2255 03/25/19 1616  LATICACIDVEN 6.6* 5.8* 1.8    Recent Results (from the past 240 hour(s))  SARS Coronavirus 2 (CEPHEID - Performed in [facility] hospital lab),  Hosp Order     Status: None   Collection Time: 03/24/19  1:13 PM  Result Value Ref Range Status   SARS Coronavirus 2 NEGATIVE NEGATIVE Final    Comment: (NOTE) If result is NEGATIVE SARS-CoV-2 target nucleic acids are NOT DETECTED. The SARS-CoV-2 RNA is generally detectable in upper and lower  respiratory specimens during the acute phase of infection. The lowest  concentration of SARS-CoV-2 viral copies this assay can detect is 250  copies / mL. A negative result does not preclude SARS-CoV-2 infection  and should not be used as the sole basis for treatment or other  patient management decisions.  A negative result may occur with  improper specimen collection / handling, submission of specimen other  than nasopharyngeal swab, presence of viral mutation(s) within the  areas targeted by this assay, and inadequate number of viral  copies  (<250 copies / mL). A negative result must be combined with clinical  observations, patient history, and epidemiological information. If result is POSITIVE SARS-CoV-2 target nucleic acids are DETECTED. The SARS-CoV-2 RNA is generally detectable in upper and lower  respiratory specimens dur ing the acute phase of infection.  Positive  results are indicative of active infection with SARS-CoV-2.  Clinical  correlation with patient history and other diagnostic information is  necessary to determine patient infection status.  Positive results do  not rule out bacterial infection or co-infection with other viruses. If result is PRESUMPTIVE POSTIVE SARS-CoV-2 nucleic acids MAY BE PRESENT.   A presumptive positive result was obtained on the submitted specimen  and confirmed on repeat testing.  While 2019 novel coronavirus  (SARS-CoV-2) nucleic acids may be present in the submitted sample  additional confirmatory testing may be necessary for epidemiological  and / or clinical management purposes  to differentiate between  SARS-CoV-2 and other Sarbecovirus currently known to infect humans.  If clinically indicated additional testing with an alternate test  methodology [phone]) is advised. The SARS-CoV-2 RNA is generally  detectable in upper and lower respiratory sp ecimens during the acute  phase of infection. The expected result is Negative. Fact Sheet for Patients:  BoilerBrush.com.cy Fact Sheet for Healthcare Providers: https://pope.com/ This test is not yet approved or cleared by the [place] FDA and has been authorized for detection and/or diagnosis of SARS-CoV-2 by FDA under an Emergency Use Authorization (EUA).  This EUA will remain in effect (meaning this test can be used) for the duration of the COVID-19 declaration under Section 564(b)(1) of the Act, 21 U.S.C. section 360bbb-3(b)(1), unless the authorization is terminated  or revoked sooner. Performed at [facility], [address] [place], [place] [address]   MRSA PCR Screening     Status: None   Collection Time: 03/24/19  8:50 PM  Result Value Ref Range Status   MRSA by PCR NEGATIVE NEGATIVE Final    Comment:        The GeneXpert MRSA Assay (FDA approved for NASAL specimens only), is one component of a comprehensive MRSA colonization surveillance program. It is not intended to diagnose MRSA infection nor to guide or monitor treatment for MRSA infections. Performed at [facility], [address] [place], [place] [address]          Radiology Studies: Ct Abdomen Pelvis Wo Contrast  Result Date: 03/26/2019 CLINICAL DATA:  Diffuse abdominal pain and tenderness. EXAM: CT ABDOMEN AND PELVIS WITHOUT CONTRAST TECHNIQUE: Multidetector CT imaging of the abdomen and pelvis was performed following the standard protocol without  IV contrast. COMPARISON:  03/19/2017 FINDINGS: Lower chest: Pleural effusions and lower lobe atelectasis or infiltrate are seen bilaterally, left side greater than right. Hepatobiliary: No mass visualized on this unenhanced exam. Gallbladder is unremarkable. Pancreas: No mass or inflammatory process visualized on this unenhanced exam. Spleen:  Within normal limits in size. Adrenals/Urinary tract: No evidence of urolithiasis or hydronephrosis. Foley catheter is seen in the urinary bladder which is nearly completely empty. Stomach/Bowel: Long segment wall thickening is seen involving the distal transverse, descending, and rectosigmoid colon, consistent with colitis. No evidence of bowel obstruction. A tiny amount of free fluid is seen in the pelvis, however there is no evidence of abscess. Normal appendix visualized. Vascular/Lymphatic: No pathologically enlarged lymph nodes identified. No evidence of abdominal aortic aneurysm. Reproductive:  No mass or other significant abnormality. Other:  None.  Musculoskeletal:  No suspicious bone lesions identified. IMPRESSION: 1. Mild-to-moderate colitis involving the distal transverse, descending, and rectosigmoid colon. 2. No evidence of abscess or bowel obstruction. 3. Bilateral pleural effusions and lower lobe atelectasis versus infiltrates, left side greater than right. Electronically Signed   By: Myles RosenthalJohn  Stahl M.D.   On: 03/26/2019 03:15   [place] Renal  Result Date: 03/24/2019 CLINICAL DATA:  Chronic kidney disease, stage III EXAM: RENAL / URINARY TRACT ULTRASOUND COMPLETE COMPARISON:  CT scan 03/19/2017 FINDINGS: Right Kidney: Renal measurements: 10.6 x 3.7 x 5.0 cm = volume: 104.7 mL. Slight increased renal echogenicity suggesting medical renal disease. Mild renal cortical thinning. No focal lesions or hydronephrosis. Left Kidney: Renal measurements: 10.6 x 5.4 x 5.0 cm = volume: 151.1 mL. Slight increased renal echogenicity suggesting medical renal disease. Mild renal cortical thinning. No worrisome renal lesions. No hydronephrosis. Bladder: Appears normal for degree of bladder distention. Other: Incidental gallstone noted the gallbladder measuring 1.7 cm. IMPRESSION: 1. Slight increased renal echogenicity and slight renal cortical thinning consistent with medical renal disease. 2. No worrisome renal lesions or hydronephrosis. 3. Incidental cholelithiasis. Electronically Signed   By: Rudie MeyerP.  Gallerani M.D.   On: 03/24/2019 18:45   Dg Chest Port 1 View  Result Date: 03/25/2019 CLINICAL DATA:  Central line placement EXAM: PORTABLE CHEST 1 VIEW COMPARISON:  11/21/2018 chest radiograph. FINDINGS: Low lung volumes. Right internal jugular central venous catheter terminates over the inferior cavoatrial junction. Stable cardiomediastinal silhouette with top-normal heart size. No pneumothorax. No pleural effusion. Vascular crowding without overt pulmonary edema. Left retrocardiac opacity. IMPRESSION: 1. No pneumothorax. Right internal jugular central venous catheter  terminates over the inferior cavoatrial junction. 2. Low lung volumes with left retrocardiac opacity, either atelectasis or pneumonia. Chest radiograph follow-up advised. Electronically Signed   By: Delbert PhenixJason A Poff M.D.   On: 03/25/2019 18:32   Dg Abd Portable 1v  Result Date: 03/24/2019 CLINICAL DATA:  Abdominal pain.  Diverticulitis. EXAM: PORTABLE ABDOMEN - 1 VIEW COMPARISON:  08/10/2017. FINDINGS: There is a large amount of stool in the colon. There is some gaseous distension of both the colon and several small bowel loops. Phleboliths project over the patient's pelvis. There is no frank evidence of a high-grade small bowel obstruction, however the entirety abdomen is not visualized on this exam. There is a linear lucency coursing through the patient's pelvis that is favored to represent artifact. IMPRESSION: 1. Large amount of stool throughout the colon. 2. Mild gaseous distention of loops of colon and small bowel without evidence of a high-grade obstruction. 3. Linear lucency coursing through the pelvis favored to represent artifact, less likely free air. If there is high clinical suspicion  for an acute intra-abdominal process short interval follow-up radiograph is recommended to confirm resolution of this finding. Electronically Signed   By: Katherine Mantlehristopher  Green M.D.   On: 03/24/2019 21:43   [place] Ekg Site Rite  Result Date: 03/24/2019 If Site Rite image not attached, placement could not be confirmed due to current cardiac rhythm.       Scheduled Meds: . Chlorhexidine Gluconate Cloth  6 each Topical Daily  . diltiazem  10 mg Intravenous Once  . hydroxychloroquine  200 mg Oral BID  . mouth rinse  15 mL Mouth Rinse BID  . metoprolol tartrate  50 mg Oral BID  . pantoprazole (PROTONIX) IV  40 mg Intravenous Q12H  . pneumococcal 23 valent vaccine  0.5 mL Intramuscular Tomorrow-1000   Continuous Infusions: . phenylephrine (NEO-SYNEPHRINE) Adult infusion Stopped (03/26/19 0748)  .  sodium  bicarbonate (isotonic) infusion in sterile water 125 mL/hr at 03/26/19 0805     LOS: 2 days    Time spent: 25 minutes.    Dorcas CarrowKuber Camden Knotek, MD [facility] Pager [phone]  If 7PM-7AM, please contact night-coverage [email or web address] Password [email or web address] 03/26/2019, 8:37 AM

## 2019-03-26 NOTE — Progress Notes (Signed)
Pt became confused and agitated. Pt spoke to daughter on the phone and started using various forms of profanity. Pt also told daughter that the hospital staff was using racial slurs toward her. I tried to reorient the patient and explain that no one was saying these things toward her. I also spoke with the daughter to clarify the situation. The daughter verbally expressed understanding that her mother was confused and that she knew these terms were not being used. Charge RN made aware of situation.

## 2019-03-27 ENCOUNTER — Inpatient Hospital Stay (HOSPITAL_COMMUNITY): Payer: Medicare Other

## 2019-03-27 DIAGNOSIS — I340 Nonrheumatic mitral (valve) insufficiency: Secondary | ICD-10-CM

## 2019-03-27 DIAGNOSIS — J9601 Acute respiratory failure with hypoxia: Secondary | ICD-10-CM

## 2019-03-27 DIAGNOSIS — K529 Noninfective gastroenteritis and colitis, unspecified: Secondary | ICD-10-CM

## 2019-03-27 DIAGNOSIS — J90 Pleural effusion, not elsewhere classified: Secondary | ICD-10-CM

## 2019-03-27 DIAGNOSIS — I4891 Unspecified atrial fibrillation: Secondary | ICD-10-CM | POA: Diagnosis not present

## 2019-03-27 DIAGNOSIS — I361 Nonrheumatic tricuspid (valve) insufficiency: Secondary | ICD-10-CM

## 2019-03-27 LAB — CBC WITH DIFFERENTIAL/PLATELET
Abs Immature Granulocytes: 0.19 10*3/uL — ABNORMAL HIGH (ref 0.00–0.07)
Basophils Absolute: 0.1 10*3/uL (ref 0.0–0.1)
Basophils Relative: 0 %
Eosinophils Absolute: 0.1 10*3/uL (ref 0.0–0.5)
Eosinophils Relative: 1 %
HCT: 29.5 % — ABNORMAL LOW (ref 36.0–46.0)
Hemoglobin: 9.4 g/dL — ABNORMAL LOW (ref 12.0–15.0)
Immature Granulocytes: 1 %
Lymphocytes Relative: 6 %
Lymphs Abs: 0.8 10*3/uL (ref 0.7–4.0)
MCH: 30.4 pg (ref 26.0–34.0)
MCHC: 31.9 g/dL (ref 30.0–36.0)
MCV: 95.5 fL (ref 80.0–100.0)
Monocytes Absolute: 0.6 10*3/uL (ref 0.1–1.0)
Monocytes Relative: 5 %
Neutro Abs: 11.4 10*3/uL — ABNORMAL HIGH (ref 1.7–7.7)
Neutrophils Relative %: 87 %
Platelets: 110 10*3/uL — ABNORMAL LOW (ref 150–400)
RBC: 3.09 MIL/uL — ABNORMAL LOW (ref 3.87–5.11)
RDW: 14.9 % (ref 11.5–15.5)
WBC: 13.3 10*3/uL — ABNORMAL HIGH (ref 4.0–10.5)
nRBC: 0 % (ref 0.0–0.2)

## 2019-03-27 LAB — GASTROINTESTINAL PANEL BY PCR, STOOL (REPLACES STOOL CULTURE)

## 2019-03-27 LAB — URINALYSIS, ROUTINE W REFLEX MICROSCOPIC
Bacteria, UA: NONE SEEN
Bilirubin Urine: NEGATIVE
Glucose, UA: NEGATIVE mg/dL
Ketones, ur: 5 mg/dL — AB
Leukocytes,Ua: NEGATIVE
Nitrite: NEGATIVE
Protein, ur: 30 mg/dL — AB
Specific Gravity, Urine: 1.011 (ref 1.005–1.030)
pH: 7 (ref 5.0–8.0)

## 2019-03-27 LAB — BASIC METABOLIC PANEL
Anion gap: 10 (ref 5–15)
BUN: 52 mg/dL — ABNORMAL HIGH (ref 8–23)
CO2: 24 mmol/L (ref 22–32)
Calcium: 7.3 mg/dL — ABNORMAL LOW (ref 8.9–10.3)
Chloride: 108 mmol/L (ref 98–111)
Creatinine, Ser: 2.16 mg/dL — ABNORMAL HIGH (ref 0.44–1.00)
GFR calc Af Amer: 27 mL/min — ABNORMAL LOW (ref >60)
GFR calc non Af Amer: 23 mL/min — ABNORMAL LOW (ref >60)
Glucose, Bld: 72 mg/dL (ref 70–99)
Potassium: 3.4 mmol/L — ABNORMAL LOW (ref 3.5–5.1)
Sodium: 142 mmol/L (ref 135–145)

## 2019-03-27 LAB — ECHOCARDIOGRAM COMPLETE
Height: 67 in
Weight: 3548.52 oz

## 2019-03-27 LAB — TROPONIN I: Troponin I: 0.32 ng/mL (ref <0.03)

## 2019-03-27 MED ORDER — FUROSEMIDE 10 MG/ML IJ SOLN
40.0000 mg | Freq: Once | INTRAMUSCULAR | Status: AC
  Administered 2019-03-27: 40 mg via INTRAVENOUS
  Filled 2019-03-27: qty 4

## 2019-03-27 MED ORDER — POTASSIUM CHLORIDE CRYS ER 20 MEQ PO TBCR
40.0000 meq | EXTENDED_RELEASE_TABLET | Freq: Once | ORAL | Status: AC
  Administered 2019-03-27: 40 meq via ORAL
  Filled 2019-03-27: qty 2

## 2019-03-27 MED ORDER — SODIUM CHLORIDE 0.9 % IV SOLN
INTRAVENOUS | Status: DC
  Administered 2019-03-27 – 2019-03-31 (×2): via INTRAVENOUS

## 2019-03-27 NOTE — Progress Notes (Signed)
  Echocardiogram 2D Echocardiogram has been performed.  Michelle Graves Michelle Graves 03/27/2019, 4:04 PM

## 2019-03-27 NOTE — Progress Notes (Addendum)
[facility] Progress Note  Subjective: creat down 2.18 today, UOP good at 3.4 L yesterday. K 3.4 and CO2 24.  BUN 52.  No new c/o.  BP's high today.    Vitals:   03/27/19 1000 03/27/19 1100 03/27/19 1200 03/27/19 1211  BP: (!) 177/85 (!) 157/91 (!) 158/100   Pulse: 95 94 88   Resp: 17 18 19    Temp:    97.9 F (36.6 C)  TempSrc:    Oral  SpO2: 96% 95% 95%   Weight:      Height:        Inpatient medications: . Chlorhexidine Gluconate Cloth  6 each Topical Daily  . hydroxychloroquine  200 mg Oral BID  . mouth rinse  15 mL Mouth Rinse BID  . metoprolol tartrate  100 mg Oral BID  . pantoprazole (PROTONIX) IV  40 mg Intravenous Q12H  . pneumococcal 23 valent vaccine  0.5 mL Intramuscular Tomorrow-1000  . potassium chloride  40 mEq Oral Once   . sodium chloride 10 mL/hr at 03/27/19 1234  . diltiazem (CARDIZEM) infusion Stopped (03/27/19 1208)  . piperacillin-tazobactam (ZOSYN)  IV Stopped (03/27/19 1144)   acetaminophen **OR** acetaminophen, HYDROcodone-acetaminophen, morphine injection, ondansetron **OR** ondansetron (ZOFRAN) IV    Exam: Gen awake, chron ill appearing No jvd or bruits Chest clear bilat to bases no rales or wheezing RRR no MRG Abd soft ntnd no mass or ascites +bs Ext no sig LE edema  Neuro is alert, Ox 3 , nf    Home meds:  - amlodipine 10/ clonidine 0.3 qd/ hydrochlorothiazide 25 qd/ lisinopril 20 qd/ metoprolol 100 bid/ Kdur 40 qd  - prednisone 20 qd/ enbrel 50 mg weekly/ hydroxychloroquine 200 bid  - trazodone 100 qd  - omeprazole 20 bid/ terbinafine 250 qd  - aspirin 81   - prn naproxen 500 qd  - prn's/ vitamins/ supplements   UA > pending UNa 48 , UCr 191    Renal US > IMPRESSION: 1. Slight increased renal echogenicity and slight renal cortical thinning consistent with medical renal disease. 2. No worrisome renal lesions or hydronephrosis. 3. Incidental cholelithiasis.  Assessment: 1. AKI on CKD 3 - creat 3.89 on  admission, baseline creat 1.2- 1.6. AKI due to vol depletion/ diarrhea/ N/V + ACEi/ nsaid's. AKI resolving w/ creat down 2.1 today. Renal US chronic changes, UA unremarkable.  IVF's approptiately dc'd. Will sign off.  2. Sepsis - unclear cause, CCM managing and [facility], off pressors.  3. Vol depletion - improved after multiple liters of fluid 4. N/V / diarrhea 5. RA - on prednisone and Enbrel, immunosuppresed 6. Obesity 7. HTN - resume BP meds as needed, using ACEi last if possible      Rob [facility] 03/27/2019, 2:30 PM  Iron/TIBC/Ferritin/ %Sat    Component Value Date/Time   IRON 61 08/29/2016 0224   TIBC 227 (L) 08/29/2016 0224   FERRITIN 555 (H) 08/29/2016 0224   IRONPCTSAT 27 08/29/2016 0224   Recent Labs  Lab 03/24/19 1309  03/25/19 1616 03/26/19 0405 03/27/19 0401  NA 139   < > 136 140 142  K 4.4   < > 4.5 4.2 3.4*  CL 95*   < > 105 110 108  CO2 21*   < > 20* 20* 24  GLUCOSE 165*   < > 79 93 72  BUN 80*   < > 77* 70* 52*  CREATININE 5.04*   < > 3.12* 2.85* 2.16*  CALCIUM 9.4   < >  6.6* 6.5* 7.3*  PHOS  --   --   --  5.0*  --   ALBUMIN 4.4  --  2.4*  --   --   INR 1.0  --   --   --   --    < > = values in this interval not displayed.   Recent Labs  Lab 03/25/19 1616  AST 29  ALT 16  ALKPHOS 64  BILITOT 0.9  PROT 5.4*   Recent Labs  Lab 03/27/19 0401  WBC 13.3*  HGB 9.4*  HCT 29.5*  PLT 110*

## 2019-03-27 NOTE — Progress Notes (Signed)
NAME:  Michelle Graves, MRN:  [id number], DOB:  [birth year], LOS: 3 ADMISSION DATE:  03/24/2019, CONSULTATION DATE:  03/24/2019 REFERRING MD:  Jerral Ralph CHIEF COMPLAINT:  Hypotension, shock   Brief History   68 yo female presented with hematemesis, melena, abdominal pain x 3 days.  Remained hypotensive and PCCM asked to assess.  Past Medical History  HTN, Diverticulitis, RA on prednisone/enbrel/plaquenil, CKD 3, HLD, Depression, Chronic pain, Insomnia, Nocturia, Sjogren's syndrome  Significant Hospital Events   5/28 Admit 5/29 Start pressors; SVT tx with adenosine/amiodarone/cardizem 5/30 start ABx for colitis; off pressors  Consults:  Nephrology 5/28 AKI GI 5/29 hematemesis  Procedures:  Rt IJ CVL 5/29 >>5/30 Rt radial aline 5/29 >> 5/31 Lt IJ CVL 5/30 >>  Significant Diagnostic Tests:  Renal u/s 5/28 >> increased renal echogenicity and renal cortical thinning CT abd/pelvis 5/30 >> changes of colitis CT chest 5/30 >> mod Lt effusion, small Rt effusion (reviewed by me)  Micro Data:  COVID 5/28 >> negative Blood 5/29 >>  C diff 5/30 >> negative GI PCR 5/30 >>   Antimicrobials:  Zosyn 5/30 >>   Interim history/subjective:  Remains on cardizem, supplemental oxygen.  No fever.  Positive 5.1 liter fluid balance.  Objective   Blood pressure 136/90, pulse 98, temperature 97.9 F (36.6 C), temperature source Oral, resp. rate 15, height 5\' 7"  (1.702 m), weight 100.6 kg, SpO2 98 %. CVP:  [7 mmHg-15 mmHg] 7 mmHg      Intake/Output Summary (Last 24 hours) at 03/27/2019 0825 Last data filed at 03/27/2019 0700 Gross per 24 hour  Intake 2492.9 ml  Output 3425 ml  Net -932.1 ml   Filed Weights   03/25/19 0500 03/26/19 0401 03/27/19 0433  Weight: 99.5 kg 100 kg 100.6 kg   Examination:  General - alert Eyes - pupils reactive ENT - no sinus tenderness, no stridor Cardiac - irregular Chest - decreased BS at bases Lt > Rt Abdomen - soft, mild/diffuse tenderness, + bowel  sounds, no rebound/guarding Extremities - 1+ edema Skin - no rashes Neuro - mildly confused, follows commands   Resolved Hospital Problem list   Hypovolemic shock, melena, hematemesis, metabolic acidosis, lactic acidosis  Assessment & Plan:   Colitis. Plan - continue ABx - f/u GI PCR panel  AKI from hypovolemia, ACE inhibitor, and chronic NSAID use. CKD 3 >> baseline creatinine 1.72 from 03/15/19. Hypokalemia. Plan - monitor renal fx, urine outpt - d/c HCO3 from IV fluid  Acute metabolic encephalopathy. Hx of depression, chronic pain. Plan - monitor mental status - prn norco - hold outpt zanaflex, trazodone, neurontin  Acute hypoxic respiratory failure from pleural effusion and atelectasis. Plan - lasix 40 mg IV x one 5/31 - oxygen to keep Spo2 > 92% - f/u CXR - bronchial hygiene  A fib with RVR new. Hx of HTN, HLD. Plan - goal HR < 110 - continue lopressor - f/u Echo - hold outpt norvasc, ASA, catapres, HCTZ, lisinopril  History of rheumatoid arthritis. Plan - continue plaquenil - hold outpt prednisone, enbrel  Anemia from GI bleed and chronic disease. Plan - f/u CBC - transfuse for Hb < 7  Best practice:  DVT prophylaxis: SCDs SUP: Protonix Nutrition: clear liquids Code status: DNR Disposition: ICU  Labs    CMP Latest Ref Rng & Units 03/27/2019 03/26/2019 03/25/2019  Glucose 70 - 99 mg/dL 72 93 79  BUN 8 - 23 mg/dL 24(P) 80(D) 98(P)  Creatinine 0.44 - 1.00 mg/dL 3.82(N) 0.53(Z) 7.67(H)  Sodium 135 -  145 mmol/L 142 140 136  Potassium 3.5 - 5.1 mmol/L 3.4(L) 4.2 4.5  Chloride 98 - 111 mmol/L 108 110 105  CO2 22 - 32 mmol/L 24 20(L) 20(L)  Calcium 8.9 - 10.3 mg/dL 7.3(L) 6.5(L) 6.6(L)  Total Protein 6.5 - 8.1 g/dL - - 5.4(L)  Total Bilirubin 0.3 - 1.2 mg/dL - - 0.9  Alkaline Phos 38 - 126 U/L - - 64  AST 15 - 41 U/L - - 29  ALT 0 - 44 U/L - - 16   CBC Latest Ref Rng & Units 03/27/2019 03/26/2019 03/25/2019  WBC 4.0 - 10.5 K/uL 13.3(H) 12.6(H)  10.7(H)  Hemoglobin 12.0 - 15.0 g/dL 2.4(Q) 6.8(T) 10.4(L)  Hematocrit 36.0 - 46.0 % 29.5(L) 30.5(L) 32.5(L)  Platelets 150 - 400 K/uL 110(L) 130(L) 137(L)   CBG (last 3)  Recent Labs    03/24/19 1248  GLUCAP 158*   Coralyn Helling, MD [facility] Pulmonary/Critical Care 03/27/2019, 8:57 AM

## 2019-03-27 NOTE — Progress Notes (Signed)
CRITICAL VALUE ALERT  Critical Value: troponin 0.32 , K+ 3.4  Date & Time Notied:  0506  Provider Notified:Dr Deterding , Loraine Leriche RN  Orders Received/Actions taken: awaiting orders

## 2019-03-27 NOTE — Progress Notes (Signed)
[facility] ADULT ICU REPLACEMENT PROTOCOL FOR AM LAB REPLACEMENT ONLY  The patient does not apply for the [facility] Adult ICU Electrolyte Replacment Protocol based on the criteria listed below:   1. Is GFR >/= 40 ml/min? No.  Patient's GFR today is 23    Abnormal electrolyte(s): K=3.4   If a panic level lab has been reported, has the CCM MD in charge been notified? Yes.  .   Physician:  D Deterding,MD  Melrose Nakayama 03/27/2019 5:13 AM

## 2019-03-27 NOTE — Plan of Care (Signed)
°  Problem: Coping: °Goal: Level of anxiety will decrease °Outcome: Progressing °  °

## 2019-03-27 NOTE — Progress Notes (Signed)
PROGRESS NOTE    Michelle Graves  WUJ:[id number] DOB: [birth year] DOA: 03/24/2019 PCP: [facility]    Brief Narrative:  Patient is 68 year old female with history of hypertension, stage III CKD with reported baseline creatinine of 1.6, rheumatoid arthritis who presented to the hospital with nausea, black emesis and abdominal pain for about 3 days.  In the emergency room, patient was afebrile, heart rate 100, respiratory 20, blood pressures 80/50.  Creatinine of 5.04 and bilirubin of 1.6.  Hemoglobin was stable at 14.  She was given Protonix, fluid boluses and admitted for acute renal failure.  03/25/2019: Persistent low blood pressures, low perfusion and urine output and poor venous access. Transfer to ICU, central line and arterial line placed, further resuscitation with fluid with improvement.  Also received Neo-Synephrine overnight. CT scan shows diffuse colitis.  Patient had loose bowel movement overnight.  No recent antibiotic use. 03/26/2019: Confusion and agitation, central line pulled out.  SVT followed by rapid A. fib converted to sinus with Cardizem drip.   Assessment & Plan:   Principal Problem:   Acute kidney injury superimposed on CKD (HCC) Active Problems:   HYPERTENSION, MALIGNANT ESSENTIAL   KIDNEY DISEASE, CHRONIC, STAGE III   Hypotension   Dark emesis   Dark stools   New onset a-fib (HCC)  Acute kidney injury with history of hypertensive kidney disease stage III: Baseline creatinine about 1.2-1.6. Prerenal due to severe dehydration.  Also with use of NSAID'sAnd ongoing use of ACE inhibitor. Treated with IV fluid boluses.  Holding all antihypertensive medications except metoprolol. Patient needed vasopressor support transiently, now off. Followed by nephrology. Continue strict intake and output monitoring, will need to continue Foley catheter for strict monitoring. Renal ultrasound shows chronic renal disease, no hydronephrosis or  obstruction. With improvement in blood pressures and positive balance, a dose of Lasix today.  Hypovolemic shock with nausea, vomiting with coffee-ground emesis: Sepsis present on admission.  Ruled in. Hb base line around 12-9.7. Probably peptic ulcer disease.  Aggravated by use of NSAID's. Hemoglobin has remained stable with some drop with hemodilution.  No evidence of ongoing bleed.  Remains on Protonix twice daily. Will be seen by gastroenterology.  Will benefit with endoscopic evaluation at some point. CT scan without contrast showed transverse and descending colon colitis.  No recent antibiotic use.  Blood cultures with no growth so far. -C. difficile negative.  GI pathogen panel pending. Due to significant symptoms, started on Zosyn.  Lactic acidosis: Probably due to severe dehydration and/or sepsis. Normalized.  New onset A. fib with RVR: Resume metoprolol.  Currently on Cardizem drip.  Hopefully will wean off the Cardizem drip.  Converted to normal sinus rhythm.  Blood pressures are adequate.   Minimal troponin elevation suggestive of type II injury due to tachycardia. 2D echocardiogram pending from today.  Essential hypertension:  Holding amlodipine, clonidine, hydrochlorothiazide lisinopril.  Rheumatoid arthritis:  On Plaquenil that we will continue.  Recently completed a steroid burst.  Avoid NSAID's.   DVT prophylaxis: SCDs Code Status: DNR Family Communication: We will communicate with family, patient's daughter informed and updated. Disposition Plan: ICU.   Consultants:   Nephrology  GI  PCCM  Procedures:   CvC and arterial line  Antimicrobials:   Zosyn, 03/26/2019   Subjective: Patient seen and examined.  Patient has some diffuse abdominal pain.  Denies any nausea vomiting.  Able to eat liquid diet.  Had frequent loose bowel movements.  Adequate urine output. Developed some confusion and  agitation yesterday.  Central line has to be redone. Blood  pressures are adequate. CT scan shows left-sided pleural effusion. Patient developed rapid A. fib and converted to sinus with Cardizem drip.  Objective: Vitals:   03/27/19 0745 03/27/19 0800 03/27/19 0830 03/27/19 0934  BP:  (!) 160/96 (!) 159/82 (!) 168/85  Pulse:  98 100 94  Resp:  18 19   Temp: 97.9 F (36.6 C)     TempSrc: Oral     SpO2:  100% 98%   Weight:      Height:        Intake/Output Summary (Last 24 hours) at 03/27/2019 0942 Last data filed at 03/27/2019 0700 Gross per 24 hour  Intake 2492.9 ml  Output 3425 ml  Net -932.1 ml   Filed Weights   03/25/19 0500 03/26/19 0401 03/27/19 0433  Weight: 99.5 kg 100 kg 100.6 kg    Examination:  Physical Exam  Constitutional: She is oriented to person, place, and time. She appears well-developed.  In mild distress with abdominal pain and anxious looking.  HENT:  Head: Normocephalic and atraumatic.  Mouth/Throat: No oropharyngeal exudate.  Eyes: Pupils are equal, round, and reactive to light. Left eye exhibits no discharge.  Neck: Normal range of motion. Neck supple.  Cardiovascular: Normal rate and regular rhythm.  No murmur heard. Respiratory:  Bilateral decreased air entry, left more than right.  No added sounds.  No wheezing or crepitations.  GI: Soft. Bowel sounds are normal. She exhibits no mass. There is no guarding.  Mild diffuse tenderness with no rigidity or guarding.  Musculoskeletal: Normal range of motion.        General: No deformity or edema.  Lymphadenopathy:    She has no cervical adenopathy.  Neurological: She is alert and oriented to person, place, and time.  Skin: Skin is warm and dry.  Psychiatric: She has a normal mood and affect. Judgment normal.      Data Reviewed: I have personally reviewed following labs and imaging studies  CBC: Recent Labs  Lab 03/24/19 1309 03/25/19 0300 03/25/19 1616 03/26/19 0405 03/27/19 0401  WBC 11.6* 9.9 10.7* 12.6* 13.3*  NEUTROABS 10.2*  --  9.1*  --   11.4*  HGB 14.6 12.4 10.4* 9.7* 9.4*  HCT 46.2* 38.8 32.5* 30.5* 29.5*  MCV 98.1 96.0 96.2 96.8 95.5  PLT 200 105* 137* 130* 110*   Basic Metabolic Panel: Recent Labs  Lab 03/24/19 1309 03/25/19 0300 03/25/19 1616 03/26/19 0405 03/27/19 0401  NA 139 139 136 140 142  K 4.4 4.7 4.5 4.2 3.4*  CL 95* 111 105 110 108  CO2 21* 13* 20* 20* 24  GLUCOSE 165* 88 79 93 72  BUN 80* 72* 77* 70* 52*  CREATININE 5.04* 3.89* 3.12* 2.85* 2.16*  CALCIUM 9.4 7.3* 6.6* 6.5* 7.3*  MG  --   --   --  2.0  --   PHOS  --   --   --  5.0*  --    GFR: Estimated Creatinine Clearance: 31.2 mL/min (A) (by C-G formula based on SCr of 2.16 mg/dL (H)). Liver Function Tests: Recent Labs  Lab 03/24/19 1309 03/25/19 1616  AST 35 29  ALT 20 16  ALKPHOS 93 64  BILITOT 1.6* 0.9  PROT 8.8* 5.4*  ALBUMIN 4.4 2.4*   No results for input(s): LIPASE, AMYLASE in the last 168 hours. Recent Labs  Lab 03/25/19 1747  AMMONIA 27   Coagulation Profile: Recent Labs  Lab 03/24/19 1309  INR 1.0   Cardiac Enzymes: Recent Labs  Lab 03/26/19 0612 03/26/19 1238 03/27/19 0406  TROPONINI 0.03* <0.03 0.32*   BNP (last 3 results) No results for input(s): PROBNP in the last 8760 hours. HbA1C: No results for input(s): HGBA1C in the last 72 hours. CBG: Recent Labs  Lab 03/24/19 1248  GLUCAP 158*   Lipid Profile: No results for input(s): CHOL, HDL, LDLCALC, TRIG, CHOLHDL, LDLDIRECT in the last 72 hours. Thyroid Function Tests: No results for input(s): TSH, T4TOTAL, FREET4, T3FREE, THYROIDAB in the last 72 hours. Anemia Panel: No results for input(s): VITAMINB12, FOLATE, FERRITIN, TIBC, IRON, RETICCTPCT in the last 72 hours. Sepsis Labs: Recent Labs  Lab 03/24/19 1821 03/24/19 2255 03/25/19 1616  LATICACIDVEN 6.6* 5.8* 1.8    Recent Results (from the past 240 hour(s))  SARS Coronavirus 2 (CEPHEID - Performed in [facility] hospital lab), Hosp Order     Status: None   Collection Time: 03/24/19   1:13 PM  Result Value Ref Range Status   SARS Coronavirus 2 NEGATIVE NEGATIVE Final    Comment: (NOTE) If result is NEGATIVE SARS-CoV-2 target nucleic acids are NOT DETECTED. The SARS-CoV-2 RNA is generally detectable in upper and lower  respiratory specimens during the acute phase of infection. The lowest  concentration of SARS-CoV-2 viral copies this assay can detect is 250  copies / mL. A negative result does not preclude SARS-CoV-2 infection  and should not be used as the sole basis for treatment or other  patient management decisions.  A negative result may occur with  improper specimen collection / handling, submission of specimen other  than nasopharyngeal swab, presence of viral mutation(s) within the  areas targeted by this assay, and inadequate number of viral copies  (<250 copies / mL). A negative result must be combined with clinical  observations, patient history, and epidemiological information. If result is POSITIVE SARS-CoV-2 target nucleic acids are DETECTED. The SARS-CoV-2 RNA is generally detectable in upper and lower  respiratory specimens dur ing the acute phase of infection.  Positive  results are indicative of active infection with SARS-CoV-2.  Clinical  correlation with patient history and other diagnostic information is  necessary to determine patient infection status.  Positive results do  not rule out bacterial infection or co-infection with other viruses. If result is PRESUMPTIVE POSTIVE SARS-CoV-2 nucleic acids MAY BE PRESENT.   A presumptive positive result was obtained on the submitted specimen  and confirmed on repeat testing.  While 2019 novel coronavirus  (SARS-CoV-2) nucleic acids may be present in the submitted sample  additional confirmatory testing may be necessary for epidemiological  and / or clinical management purposes  to differentiate between  SARS-CoV-2 and other Sarbecovirus currently known to infect humans.  If clinically indicated  additional testing with an alternate test  methodology [phone]([id number]) is advised. The SARS-CoV-2 RNA is generally  detectable in upper and lower respiratory sp ecimens during the acute  phase of infection. The expected result is Negative. Fact Sheet for Patients:  BoilerBrush.com.cyhttps://www.fda.gov/media/136312/download Fact Sheet for Healthcare Providers: https://pope.com/https://www.fda.gov/media/136313/download This test is not yet approved or cleared by the [place] FDA and has been authorized for detection and/or diagnosis of SARS-CoV-2 by FDA under an Emergency Use Authorization (EUA).  This EUA will remain in effect (meaning this test can be used) for the duration of the COVID-19 declaration under Section 564(b)(1) of the Act, 21 U.S.C. section 360bbb-3(b)(1), unless the authorization is terminated or revoked sooner. Performed at [facility], [address] [place], [place] [address]   MRSA PCR Screening     Status: None   Collection Time: 03/24/19  8:50 PM  Result Value Ref Range Status   MRSA by PCR NEGATIVE NEGATIVE Final    Comment:        The GeneXpert MRSA Assay (FDA approved for NASAL specimens only), is one component of a comprehensive MRSA colonization surveillance program. It is not intended to diagnose MRSA infection nor to guide or monitor treatment for MRSA infections. Performed at [facility], [address] [place], [place] [address]   Culture, blood (routine x 2)     Status: None (Preliminary result)   Collection Time: 03/25/19  6:40 PM  Result Value Ref Range Status   Specimen Description   Final    BLOOD CENTRAL LINE Performed at [facility], [address], [place], [place] [address]    Special Requests   Final    BOTTLES DRAWN AEROBIC AND ANAEROBIC Blood Culture adequate volume Performed at [facility], [address], [place], [place] [address]    Culture   Final    NO GROWTH < 24  HOURS Performed at [facility], [address] [place], [place] [address]    Report Status PENDING  Incomplete  Culture, blood (routine x 2)     Status: None (Preliminary result)   Collection Time: 03/25/19  6:54 PM  Result Value Ref Range Status   Specimen Description   Final    BLOOD ARTERIAL Performed at [facility], [address] [place], [place] [address]    Special Requests   Final    BOTTLES DRAWN AEROBIC AND ANAEROBIC Blood Culture adequate volume Performed at [facility], [address] [place], [place] [address]    Culture   Final    NO GROWTH < 24 HOURS Performed at [facility], [address], [place], [place] [address]    Report Status PENDING  Incomplete  C difficile quick scan w PCR reflex     Status: None   Collection Time: 03/26/19  8:21 AM  Result Value Ref Range Status   C Diff antigen NEGATIVE NEGATIVE Final   C Diff toxin NEGATIVE NEGATIVE Final   C Diff interpretation No C. difficile detected.  Final    Comment: Performed at [facility], [address] [place], [place] [address]         Radiology Studies: Ct Abdomen Pelvis Wo Contrast  Result Date: 03/26/2019 CLINICAL DATA:  Diffuse abdominal pain and tenderness. EXAM: CT ABDOMEN AND PELVIS WITHOUT CONTRAST TECHNIQUE: Multidetector CT imaging of the abdomen and pelvis was performed following the standard protocol without IV contrast. COMPARISON:  03/19/2017 FINDINGS: Lower chest: Pleural effusions and lower lobe atelectasis or infiltrate are seen bilaterally, left side greater than right. Hepatobiliary: No mass visualized on this unenhanced exam. Gallbladder is unremarkable. Pancreas: No mass or inflammatory process visualized on this unenhanced exam. Spleen:  Within normal limits in size. Adrenals/Urinary tract: No evidence of urolithiasis or hydronephrosis. Foley catheter is seen in the urinary bladder which is nearly completely  empty. Stomach/Bowel: Long segment wall thickening is seen involving the distal transverse, descending, and rectosigmoid colon, consistent with colitis. No evidence of bowel obstruction. A tiny amount of free fluid is seen in the pelvis, however there is no evidence of abscess. Normal appendix visualized. Vascular/Lymphatic: No pathologically enlarged lymph nodes identified. No evidence of abdominal aortic aneurysm.  Reproductive:  No mass or other significant abnormality. Other:  None. Musculoskeletal:  No suspicious bone lesions identified. IMPRESSION: 1. Mild-to-moderate colitis involving the distal transverse, descending, and rectosigmoid colon. 2. No evidence of abscess or bowel obstruction. 3. Bilateral pleural effusions and lower lobe atelectasis versus infiltrates, left side greater than right. Electronically Signed   By: Myles RosenthalJohn  Stahl M.D.   On: 03/26/2019 03:15   Ct Chest Wo Contrast  Result Date: 03/26/2019 CLINICAL DATA:  LEFT hilar fullness.  Short of breath EXAM: CT CHEST WITHOUT CONTRAST TECHNIQUE: Multidetector CT imaging of the chest was performed following the standard protocol without IV contrast. COMPARISON:  None. FINDINGS: Cardiovascular: Central venous line tip in the distal SVC. No pericardial effusion. Mediastinum/Nodes: No axillary or supraclavicular adenopathy. No mediastinal adenopathy. Fullness in LEFT hilum is difficult to assess without IV contrast. Lungs/Pleura: Moderate LEFT effusion with passive atelectasis of the LEFT lower lobe. No clear obstructing lesion identified. Smaller effusion on the RIGHT. Upper Abdomen: Limited view of the liver, kidneys, pancreas are unremarkable. Normal adrenal glands. Musculoskeletal: No aggressive osseous lesion. IMPRESSION: 1. Moderate to large LEFT effusion with passive atelectasis of the LEFT lower lobe. No clear mass identified. Fullness in the LEFT hilum is difficult to assess without IV contrast. 2. Small RIGHT effusion. 3. Central venous line  in place without complication. Electronically Signed   By: Genevive BiStewart  Edmunds M.D.   On: 03/26/2019 17:25   Dg Chest Port 1 View  Result Date: 03/26/2019 CLINICAL DATA:  Left central line placement EXAM: PORTABLE CHEST 1 VIEW COMPARISON:  03/25/2019 FINDINGS: Left central line has been placed with the tip in the SVC. No pneumothorax. Layering left pleural effusion with left lower lobe atelectasis or infiltrate. Low lung volumes. Minimal right base atelectasis. Cardiomegaly, vascular congestion. IMPRESSION: Left central line placement with the tip in the SVC. No pneumothorax. Layering left effusion. Left base atelectasis or infiltrate. Minimal right base atelectasis. Cardiomegaly, vascular congestion. Electronically Signed   By: Charlett NoseKevin  Dover M.D.   On: 03/26/2019 15:05   Dg Chest Port 1 View  Result Date: 03/25/2019 CLINICAL DATA:  Central line placement EXAM: PORTABLE CHEST 1 VIEW COMPARISON:  11/21/2018 chest radiograph. FINDINGS: Low lung volumes. Right internal jugular central venous catheter terminates over the inferior cavoatrial junction. Stable cardiomediastinal silhouette with top-normal heart size. No pneumothorax. No pleural effusion. Vascular crowding without overt pulmonary edema. Left retrocardiac opacity. IMPRESSION: 1. No pneumothorax. Right internal jugular central venous catheter terminates over the inferior cavoatrial junction. 2. Low lung volumes with left retrocardiac opacity, either atelectasis or pneumonia. Chest radiograph follow-up advised. Electronically Signed   By: Delbert PhenixJason A Poff M.D.   On: 03/25/2019 18:32        Scheduled Meds: . Chlorhexidine Gluconate Cloth  6 each Topical Daily  . hydroxychloroquine  200 mg Oral BID  . mouth rinse  15 mL Mouth Rinse BID  . metoprolol tartrate  100 mg Oral BID  . pantoprazole (PROTONIX) IV  40 mg Intravenous Q12H  . pneumococcal 23 valent vaccine  0.5 mL Intramuscular Tomorrow-1000  . potassium chloride  40 mEq Oral Once    Continuous Infusions: . sodium chloride 10 mL/hr at 03/27/19 0934  . diltiazem (CARDIZEM) infusion 5 mg/hr (03/27/19 0700)  . piperacillin-tazobactam (ZOSYN)  IV 3.375 g (03/27/19 0740)     LOS: 3 days    Time spent: 25 minutes.    Dorcas CarrowKuber Jayin Derousse, MD [facility] Pager [phone]  If 7PM-7AM, please contact night-coverage [email or web address] Password [email or web address] 03/27/2019, 9:42 AM

## 2019-03-27 NOTE — Progress Notes (Signed)
eLink Physician-Brief Progress Note Patient Name: MONAY SAXER DOB: 12-23-1950 MRN: [id number]   Date of Service  03/27/2019  HPI/Events of Note  Hypokalemia  eICU Interventions  Potassium replaced     Intervention Category Intermediate Interventions: Electrolyte abnormality - evaluation and management  DETERDING,ELIZABETH 03/27/2019, 5:05 AM

## 2019-03-27 NOTE — Progress Notes (Addendum)
[facility] Gastroenterology Progress Note  Michelle Graves 68 y.o. January 31, 1951  CC: GI bleed/colitis   Subjective: Patient somewhat lethargic this morning.  Discussed with nursing staff.  No further bleeding episodes but continues to have diarrhea.  C. difficile negative.  ROS : Not able to obtain   Objective: Vital signs in last 24 hours: Vitals:   03/27/19 0830 03/27/19 0934  BP: (!) 159/82 (!) 168/85  Pulse: 100 94  Resp: 19   Temp:    SpO2: 98%     Physical Exam:  General:   Resting comfortably in the bed.  Head:  Normocephalic, without obvious abnormality, atraumatic     Lungs:    Anterior exam only.  Occasional rhonchi.  Heart:  Regular rate and rhythm, tachycardia  Abdomen:    Mild distended, nontender, bowel sounds present.  No peritoneal signs  Extremities: Extremities normal, atraumatic, no  edema       Lab Results: Recent Labs    03/26/19 0405 03/27/19 0401  NA 140 142  K 4.2 3.4*  CL 110 108  CO2 20* 24  GLUCOSE 93 72  BUN 70* 52*  CREATININE 2.85* 2.16*  CALCIUM 6.5* 7.3*  MG 2.0  --   PHOS 5.0*  --    Recent Labs    03/24/19 1309 03/25/19 1616  AST 35 29  ALT 20 16  ALKPHOS 93 64  BILITOT 1.6* 0.9  PROT 8.8* 5.4*  ALBUMIN 4.4 2.4*   Recent Labs    03/25/19 1616 03/26/19 0405 03/27/19 0401  WBC 10.7* 12.6* 13.3*  NEUTROABS 9.1*  --  11.4*  HGB 10.4* 9.7* 9.4*  HCT 32.5* 30.5* 29.5*  MCV 96.2 96.8 95.5  PLT 137* 130* 110*   Recent Labs    03/24/19 1309  LABPROT 13.5  INR 1.0      Assessment/Plan: -Coffee-ground emesis.  Resolved.  No further bleeding episodes -Acute blood loss anemia.  Hemoglobin down to 9.7.  No further bleeding episodes. -Abnormal CT scan showing colitis.  Patient currently having diarrhea. -A. fib.  Converted back to sinus rhythm.  Elevated troponins.  Recommendations ------------------------ -C. difficile negative.  GI pathogen panel pending.   -Continue antibiotics.   -Advance diet to full  liquid. -Hold off on EGD for now given lack of further bleeding -Continue PPI.  Monitor H&H. -GI will follow  Kathi Der MD, FACP 03/27/2019, 11:12 AM  Contact #  [phone]

## 2019-03-28 ENCOUNTER — Inpatient Hospital Stay (HOSPITAL_COMMUNITY): Payer: Medicare Other

## 2019-03-28 LAB — CBC WITH DIFFERENTIAL/PLATELET
Abs Immature Granulocytes: 0.24 10*3/uL — ABNORMAL HIGH (ref 0.00–0.07)
Basophils Absolute: 0 10*3/uL (ref 0.0–0.1)
Basophils Relative: 0 %
Eosinophils Absolute: 0.1 10*3/uL (ref 0.0–0.5)
Eosinophils Relative: 1 %
HCT: 31 % — ABNORMAL LOW (ref 36.0–46.0)
Hemoglobin: 10.3 g/dL — ABNORMAL LOW (ref 12.0–15.0)
Immature Granulocytes: 2 %
Lymphocytes Relative: 10 %
Lymphs Abs: 1.2 10*3/uL (ref 0.7–4.0)
MCH: 31 pg (ref 26.0–34.0)
MCHC: 33.2 g/dL (ref 30.0–36.0)
MCV: 93.4 fL (ref 80.0–100.0)
Monocytes Absolute: 0.9 10*3/uL (ref 0.1–1.0)
Monocytes Relative: 8 %
Neutro Abs: 9.2 10*3/uL — ABNORMAL HIGH (ref 1.7–7.7)
Neutrophils Relative %: 79 %
Platelets: 105 10*3/uL — ABNORMAL LOW (ref 150–400)
RBC: 3.32 MIL/uL — ABNORMAL LOW (ref 3.87–5.11)
RDW: 14.9 % (ref 11.5–15.5)
WBC: 11.7 10*3/uL — ABNORMAL HIGH (ref 4.0–10.5)
nRBC: 0 % (ref 0.0–0.2)

## 2019-03-28 LAB — BASIC METABOLIC PANEL
Anion gap: 10 (ref 5–15)
BUN: 45 mg/dL — ABNORMAL HIGH (ref 8–23)
CO2: 29 mmol/L (ref 22–32)
Calcium: 8.7 mg/dL — ABNORMAL LOW (ref 8.9–10.3)
Chloride: 103 mmol/L (ref 98–111)
Creatinine, Ser: 2.02 mg/dL — ABNORMAL HIGH (ref 0.44–1.00)
GFR calc Af Amer: 29 mL/min — ABNORMAL LOW (ref >60)
GFR calc non Af Amer: 25 mL/min — ABNORMAL LOW (ref >60)
Glucose, Bld: 92 mg/dL (ref 70–99)
Potassium: 4.1 mmol/L (ref 3.5–5.1)
Sodium: 142 mmol/L (ref 135–145)

## 2019-03-28 MED ORDER — FUROSEMIDE 40 MG PO TABS
40.0000 mg | ORAL_TABLET | Freq: Every day | ORAL | Status: DC
  Administered 2019-03-28 – 2019-04-04 (×7): 40 mg via ORAL
  Filled 2019-03-28 (×8): qty 1

## 2019-03-28 MED ORDER — CLONIDINE HCL 0.1 MG PO TABS
0.1000 mg | ORAL_TABLET | Freq: Two times a day (BID) | ORAL | Status: DC
  Administered 2019-03-28 – 2019-03-31 (×5): 0.1 mg via ORAL
  Filled 2019-03-28 (×7): qty 1

## 2019-03-28 MED ORDER — COLESTIPOL HCL 1 G PO TABS
1.0000 g | ORAL_TABLET | Freq: Two times a day (BID) | ORAL | Status: DC
  Administered 2019-03-28 – 2019-04-04 (×12): 1 g via ORAL
  Filled 2019-03-28 (×16): qty 1

## 2019-03-28 MED ORDER — AMLODIPINE BESYLATE 10 MG PO TABS
10.0000 mg | ORAL_TABLET | Freq: Every day | ORAL | Status: DC
  Administered 2019-03-28 – 2019-03-31 (×4): 10 mg via ORAL
  Filled 2019-03-28 (×4): qty 1

## 2019-03-28 NOTE — Progress Notes (Signed)
NAME:  Michelle Graves, MRN:  [id number], DOB:  08-Apr-1951, LOS: 4 ADMISSION DATE:  03/24/2019, CONSULTATION DATE:  03/24/2019 REFERRING MD:  Jerral Ralph CHIEF COMPLAINT:  Hypotension, shock   Brief History   68 y/o female presented with hematemesis, melena, abdominal pain x 3 days.  Remained hypotensive and PCCM asked to assess.  Past Medical History  HTN, Diverticulitis, RA on prednisone/enbrel/plaquenil, CKD 3, HLD, Depression, Chronic pain, Insomnia, Nocturia, Sjogren's syndrome  Significant Hospital Events   5/28 Admit 5/29 Start pressors; SVT tx with adenosine/amiodarone/cardizem 5/30 ABx for colitis; off pressors.  On cardizem, supplemental O2.  Afebrile. 5.1L + balance  Consults:  Nephrology 5/28 AKI GI 5/29 hematemesis  Procedures:  Rt IJ CVL 5/29 >>5/30 Rt radial aline 5/29 >> 5/31 Lt IJ CVL 5/30 >>  Significant Diagnostic Tests:  Renal u/s 5/28 >> increased renal echogenicity and renal cortical thinning CT abd/pelvis 5/30 >> changes of colitis CT chest 5/30 >> mod Lt effusion, small Rt effusion   Micro Data:  COVID 5/28 >> negative Blood 5/29 >>  C diff 5/30 >> negative GI PCR 5/30 >> negative   Antimicrobials:  Zosyn 5/30 >>   Interim history/subjective:  Tmax 99.3.  4.1L UOP in last 24 hours / 1.4L+ balance overall.   Objective   Blood pressure (!) 161/111, pulse 96, temperature 99.3 F (37.4 C), temperature source Oral, resp. rate 17, height 5\' 7"  (1.702 m), weight 99.7 kg, SpO2 100 %. CVP:  [8 mmHg-10 mmHg] 8 mmHg      Intake/Output Summary (Last 24 hours) at 03/28/2019 0749 Last data filed at 03/28/2019 0400 Gross per 24 hour  Intake 416.05 ml  Output 4125 ml  Net -3708.95 ml   Filed Weights   03/26/19 0401 03/27/19 0433 03/28/19 0400  Weight: 100 kg 100.6 kg 99.7 kg   Examination: General: adult female lying in bed in NAD   HEENT: MM pink/dry, no jvd, L IJ TLC c/d/i Neuro: Awakens to voice, speech clear, oriented to self CV: s1s2 rrr, no  m/r/g PULM: even/non-labored, lungs bilaterally clear anterior, decreased bases L>R GI: soft, non-tender, bsx4 active  Extremities: warm/dry, trace LE edema  Skin: no rashes or lesions  Resolved Hospital Problem list   Hypovolemic shock, melena, hematemesis, metabolic acidosis, lactic acidosis  Assessment & Plan:   Colitis P: ABX as above   AKI from hypovolemia, ACE inhibitor, and chronic NSAID use CKD 3 >> baseline creatinine 1.72 from 03/15/19 Hypokalemia P: Trend BMP / urinary output Replace electrolytes as indicated Avoid nephrotoxic agents, ensure adequate renal perfusion  Acute metabolic encephalopathy. Hx of depression, chronic pain. P: Follow serial neuro exam  PRN Norco  Hold home zanafelx, neurontin, trazodone PT consult   Acute hypoxic respiratory failure from pleural effusion and atelectasis. P: Wean O2 for sats >92% Follow intermittent CXR  Pulmonary hygiene   A fib with RVR  -new this admit, SR on 6/1 Hx of HTN, HLD P: Lopressor 100 mg BID Resume norvasc HR Goal <110 Hold outpatient ASA, cataprex, HCTZ, lisinopril   History of rheumatoid arthritis. P: Continue plaquenil  Hold enbrel, prednisone   Anemia from GI bleed and chronic disease. P: Trend CBC  Transfuse for Hgb <7% - f/u CBC - transfuse for Hb < 7  Best practice:  DVT prophylaxis: SCDs SUP: Protonix Nutrition: clear liquids Code status: DNR Disposition: SDU  Labs    CMP Latest Ref Rng & Units 03/28/2019 03/27/2019 03/26/2019  Glucose 70 - 99 mg/dL 92 72 93  BUN  8 - 23 mg/dL 54(Y) 50(P) 54(S)  Creatinine 0.44 - 1.00 mg/dL 5.68(L) 2.75(T) 7.00(F)  Sodium 135 - 145 mmol/L 142 142 140  Potassium 3.5 - 5.1 mmol/L 4.1 3.4(L) 4.2  Chloride 98 - 111 mmol/L 103 108 110  CO2 22 - 32 mmol/L 29 24 20(L)  Calcium 8.9 - 10.3 mg/dL 7.4(B) 7.3(L) 6.5(L)  Total Protein 6.5 - 8.1 g/dL - - -  Total Bilirubin 0.3 - 1.2 mg/dL - - -  Alkaline Phos 38 - 126 U/L - - -  AST 15 - 41 U/L - - -   ALT 0 - 44 U/L - - -   CBC Latest Ref Rng & Units 03/28/2019 03/27/2019 03/26/2019  WBC 4.0 - 10.5 K/uL 11.7(H) 13.3(H) 12.6(H)  Hemoglobin 12.0 - 15.0 g/dL 10.3(L) 9.4(L) 9.7(L)  Hematocrit 36.0 - 46.0 % 31.0(L) 29.5(L) 30.5(L)  Platelets 150 - 400 K/uL 105(L) 110(L) 130(L)   CBG (last 3)  No results for input(s): GLUCAP in the last 72 hours.   Canary Brim, NP-C [facility] Pgr: [phone] or if no answer [phone] 03/28/2019, 7:49 AM

## 2019-03-28 NOTE — Progress Notes (Signed)
PROGRESS NOTE    Michelle Graves  ZOX:[id number] DOB: 09/18/1951 DOA: 03/24/2019 PCP: [facility]    Brief Narrative:  Patient is 68 year old female with history of hypertension, stage III CKD with reported baseline creatinine of 1.6, rheumatoid arthritis who presented to the hospital with nausea, black emesis and abdominal pain for about 3 days.  In the emergency room, patient was afebrile, heart rate 100, respiratory 20, blood pressures 80/50.  Creatinine of 5.04 and bilirubin of 1.6.  Hemoglobin was stable at 14.  She was given Protonix, fluid boluses and admitted for acute renal failure.  03/25/2019: Persistent low blood pressures, low perfusion and urine output and poor venous access. Transfer to ICU, central line and arterial line placed, further resuscitation with fluid with improvement.  Also received Neo-Synephrine overnight. CT scan shows diffuse colitis.  Patient had loose bowel movement overnight.  No recent antibiotic use.  03/26/2019: Confusion and agitation, central line pulled out.  SVT followed by rapid A. fib converted to sinus with Cardizem drip.   Assessment & Plan:   Principal Problem:   Acute kidney injury superimposed on CKD (HCC) Active Problems:   HYPERTENSION, MALIGNANT ESSENTIAL   KIDNEY DISEASE, CHRONIC, STAGE III   Hypotension   Dark emesis   Dark stools   New onset a-fib (HCC)  Acute kidney injury with history of hypertensive kidney disease stage III: Baseline creatinine about 1.2-1.6. Prerenal due to severe dehydration.  Also with use of NSAID'sAnd ongoing use of ACE inhibitor. Treated with IV fluid boluses. Patient needed vasopressor support transiently, now off. Continue strict intake and output monitoring Renal ultrasound shows chronic renal disease, no hydronephrosis or obstruction. Renal functions continues to improve.  Was given a dose of Lasix yesterday.  Will start patient on oral Lasix.  Hypovolemic shock with  nausea, vomiting with coffee-ground emesis: Sepsis present on admission.  Ruled in. Hb base line around 12-10.3. Probably peptic ulcer disease.  Aggravated by use of NSAID's. Hemoglobin has remained stable with some drop with hemodilution.  No evidence of ongoing bleed.  Remains on Protonix twice daily.  CT scan without contrast showed transverse and descending colon colitis.  No recent antibiotic use.  Blood cultures with no growth so far. -C. difficile negative.  GI pathogen panel negative. Due to significant symptoms, started on Zosyn. GI recommending conservative management and follow-up endoscopic evaluation after clinical improvement.  Lactic acidosis: Probably due to severe dehydration and/or sepsis. Normalized.  New onset A. fib with RVR: Converted to sinus rhythm.  Was treated with Cardizem drip.  Now on oral metoprolol.   Blood pressures improving.     Minimal troponin elevation suggestive of type II injury due to tachycardia. 2D echocardiogram is fairly normal.  No new wall motion abnormalities.  Essential hypertension:  Blood pressures picking up now.  Starting amlodipine today.  Rheumatoid arthritis:  On Plaquenil that we will continue.  Recently completed a steroid burst.  Avoid NSAID's.  Work with PT OT.   DVT prophylaxis: SCDs Code Status: DNR Family Communication: patient's daughter informed and updated every day. Disposition Plan: Stepdown unit.   Consultants:   Nephrology  GI  PCCM  Procedures:   CvC and arterial line  Antimicrobials:   Zosyn, 03/26/2019   Subjective: Patient seen and examined.  Blood pressures up.  Sinus rhythm.  Afebrile.  Had some loose stool.  Patient had occasional.  Some confusion and was given soft mittens to avoid pulling on the lines.  Objective: Vitals:  03/28/19 0700 03/28/19 0740 03/28/19 0800 03/28/19 0926  BP: (!) 165/99  (!) 142/106 (!) 172/114  Pulse: 97  (!) 102 (!) 101  Resp: 18  20   Temp:  98.5 F (36.9  C)    TempSrc:  Oral    SpO2: 100%  97%   Weight:      Height:        Intake/Output Summary (Last 24 hours) at 03/28/2019 0953 Last data filed at 03/28/2019 0400 Gross per 24 hour  Intake 416.05 ml  Output 3650 ml  Net -3233.95 ml   Filed Weights   03/26/19 0401 03/27/19 0433 03/28/19 0400  Weight: 100 kg 100.6 kg 99.7 kg    Examination: Physical Exam  Constitutional: No distress.  Sleepy and lethargic.  Not in any distress.  Chronically sick looking.  HENT:  Head: Normocephalic and atraumatic.  Mouth/Throat: Oropharynx is clear and moist.  Eyes: Pupils are equal, round, and reactive to light. Conjunctivae are normal.  Neck: Normal range of motion. Neck supple.  Cardiovascular: Normal rate, regular rhythm and normal heart sounds.  Pulmonary/Chest: Effort normal and breath sounds normal.  Some conducted airway sounds.  Abdominal: Soft. Bowel sounds are normal. She exhibits no mass. There is no guarding.  Mild diffuse tenderness.  Musculoskeletal: Normal range of motion.        General: No deformity or edema.  Lymphadenopathy:    She has no cervical adenopathy.  Neurological: She is alert.  Patient was sleepy.  Alert oriented x2.  Skin: Skin is warm and dry.  Psychiatric: Mood and affect normal.     Data Reviewed: I have personally reviewed following labs and imaging studies  CBC: Recent Labs  Lab 03/24/19 1309 03/25/19 0300 03/25/19 1616 03/26/19 0405 03/27/19 0401 03/28/19 0358  WBC 11.6* 9.9 10.7* 12.6* 13.3* 11.7*  NEUTROABS 10.2*  --  9.1*  --  11.4* 9.2*  HGB 14.6 12.4 10.4* 9.7* 9.4* 10.3*  HCT 46.2* 38.8 32.5* 30.5* 29.5* 31.0*  MCV 98.1 96.0 96.2 96.8 95.5 93.4  PLT 200 105* 137* 130* 110* 105*   Basic Metabolic Panel: Recent Labs  Lab 03/25/19 0300 03/25/19 1616 03/26/19 0405 03/27/19 0401 03/28/19 0358  NA 139 136 140 142 142  K 4.7 4.5 4.2 3.4* 4.1  CL 111 105 110 108 103  CO2 13* 20* 20* 24 29  GLUCOSE 88 79 93 72 92  BUN 72* 77* 70*  52* 45*  CREATININE 3.89* 3.12* 2.85* 2.16* 2.02*  CALCIUM 7.3* 6.6* 6.5* 7.3* 8.7*  MG  --   --  2.0  --   --   PHOS  --   --  5.0*  --   --    GFR: Estimated Creatinine Clearance: 33.2 mL/min (A) (by C-G formula based on SCr of 2.02 mg/dL (H)). Liver Function Tests: Recent Labs  Lab 03/24/19 1309 03/25/19 1616  AST 35 29  ALT 20 16  ALKPHOS 93 64  BILITOT 1.6* 0.9  PROT 8.8* 5.4*  ALBUMIN 4.4 2.4*   No results for input(s): LIPASE, AMYLASE in the last 168 hours. Recent Labs  Lab 03/25/19 1747  AMMONIA 27   Coagulation Profile: Recent Labs  Lab 03/24/19 1309  INR 1.0   Cardiac Enzymes: Recent Labs  Lab 03/26/19 0612 03/26/19 1238 03/27/19 0406  TROPONINI 0.03* <0.03 0.32*   BNP (last 3 results) No results for input(s): PROBNP in the last 8760 hours. HbA1C: No results for input(s): HGBA1C in the last 72 hours. CBG: Recent  Labs  Lab 03/24/19 1248  GLUCAP 158*   Lipid Profile: No results for input(s): CHOL, HDL, LDLCALC, TRIG, CHOLHDL, LDLDIRECT in the last 72 hours. Thyroid Function Tests: No results for input(s): TSH, T4TOTAL, FREET4, T3FREE, THYROIDAB in the last 72 hours. Anemia Panel: No results for input(s): VITAMINB12, FOLATE, FERRITIN, TIBC, IRON, RETICCTPCT in the last 72 hours. Sepsis Labs: Recent Labs  Lab 03/24/19 1821 03/24/19 2255 03/25/19 1616  LATICACIDVEN 6.6* 5.8* 1.8    Recent Results (from the past 240 hour(s))  SARS Coronavirus 2 (CEPHEID - Performed in [facility] hospital lab), Hosp Order     Status: None   Collection Time: 03/24/19  1:13 PM  Result Value Ref Range Status   SARS Coronavirus 2 NEGATIVE NEGATIVE Final    Comment: (NOTE) If result is NEGATIVE SARS-CoV-2 target nucleic acids are NOT DETECTED. The SARS-CoV-2 RNA is generally detectable in upper and lower  respiratory specimens during the acute phase of infection. The lowest  concentration of SARS-CoV-2 viral copies this assay can detect is 250  copies / mL. A  negative result does not preclude SARS-CoV-2 infection  and should not be used as the sole basis for treatment or other  patient management decisions.  A negative result may occur with  improper specimen collection / handling, submission of specimen other  than nasopharyngeal swab, presence of viral mutation(s) within the  areas targeted by this assay, and inadequate number of viral copies  (<250 copies / mL). A negative result must be combined with clinical  observations, patient history, and epidemiological information. If result is POSITIVE SARS-CoV-2 target nucleic acids are DETECTED. The SARS-CoV-2 RNA is generally detectable in upper and lower  respiratory specimens dur ing the acute phase of infection.  Positive  results are indicative of active infection with SARS-CoV-2.  Clinical  correlation with patient history and other diagnostic information is  necessary to determine patient infection status.  Positive results do  not rule out bacterial infection or co-infection with other viruses. If result is PRESUMPTIVE POSTIVE SARS-CoV-2 nucleic acids MAY BE PRESENT.   A presumptive positive result was obtained on the submitted specimen  and confirmed on repeat testing.  While 2019 novel coronavirus  (SARS-CoV-2) nucleic acids may be present in the submitted sample  additional confirmatory testing may be necessary for epidemiological  and / or clinical management purposes  to differentiate between  SARS-CoV-2 and other Sarbecovirus currently known to infect humans.  If clinically indicated additional testing with an alternate test  methodology [phone]([id number]) is advised. The SARS-CoV-2 RNA is generally  detectable in upper and lower respiratory sp ecimens during the acute  phase of infection. The expected result is Negative. Fact Sheet for Patients:  BoilerBrush.com.cyhttps://www.fda.gov/media/136312/download Fact Sheet for Healthcare Providers: https://pope.com/https://www.fda.gov/media/136313/download This test is not  yet approved or cleared by the [place] FDA and has been authorized for detection and/or diagnosis of SARS-CoV-2 by FDA under an Emergency Use Authorization (EUA).  This EUA will remain in effect (meaning this test can be used) for the duration of the COVID-19 declaration under Section 564(b)(1) of the Act, 21 U.S.C. section 360bbb-3(b)(1), unless the authorization is terminated or revoked sooner. Performed at [facility], [address] [place], [place] [address]   MRSA PCR Screening     Status: None   Collection Time: 03/24/19  8:50 PM  Result Value Ref Range Status   MRSA by PCR NEGATIVE NEGATIVE Final    Comment:        The  GeneXpert MRSA Assay (FDA approved for NASAL specimens only), is one component of a comprehensive MRSA colonization surveillance program. It is not intended to diagnose MRSA infection nor to guide or monitor treatment for MRSA infections. Performed at [facility], [address] [place], [place] [address]   Culture, blood (routine x 2)     Status: None (Preliminary result)   Collection Time: 03/25/19  6:40 PM  Result Value Ref Range Status   Specimen Description   Final    BLOOD CENTRAL LINE Performed at [facility], [address] [place], [place] [address]    Special Requests   Final    BOTTLES DRAWN AEROBIC AND ANAEROBIC Blood Culture adequate volume Performed at [facility], [address] [place], [place] [address]    Culture   Final    NO GROWTH 3 DAYS Performed at [facility], [address], [place], [place] [address]    Report Status PENDING  Incomplete  Culture, blood (routine x 2)     Status: None (Preliminary result)   Collection Time: 03/25/19  6:54 PM  Result Value Ref Range Status   Specimen Description   Final    BLOOD ARTERIAL Performed at [facility], [address] [place], [place] [address]    Special Requests    Final    BOTTLES DRAWN AEROBIC AND ANAEROBIC Blood Culture adequate volume Performed at [facility], [address], [place], [place] [address]    Culture   Final    NO GROWTH 3 DAYS Performed at [facility], [address], [place], [place] [address]    Report Status PENDING  Incomplete  C difficile quick scan w PCR reflex     Status: None   Collection Time: 03/26/19  8:21 AM  Result Value Ref Range Status   C Diff antigen NEGATIVE NEGATIVE Final   C Diff toxin NEGATIVE NEGATIVE Final   C Diff interpretation No C. difficile detected.  Final    Comment: Performed at [facility], [address] [place], [place] [address]  Gastrointestinal Panel by PCR , Stool     Status: None   Collection Time: 03/26/19 12:00 PM  Result Value Ref Range Status   Campylobacter species NOT DETECTED NOT DETECTED Final   Plesimonas shigelloides NOT DETECTED NOT DETECTED Final   Salmonella species NOT DETECTED NOT DETECTED Final   Yersinia enterocolitica NOT DETECTED NOT DETECTED Final   Vibrio species NOT DETECTED NOT DETECTED Final   Vibrio cholerae NOT DETECTED NOT DETECTED Final   Enteroaggregative E coli (EAEC) NOT DETECTED NOT DETECTED Final   Enteropathogenic E coli (EPEC) NOT DETECTED NOT DETECTED Final   Enterotoxigenic E coli (ETEC) NOT DETECTED NOT DETECTED Final   Shiga like toxin producing E coli (STEC) NOT DETECTED NOT DETECTED Final   Shigella/Enteroinvasive E coli (EIEC) NOT DETECTED NOT DETECTED Final   Cryptosporidium NOT DETECTED NOT DETECTED Final   Cyclospora cayetanensis NOT DETECTED NOT DETECTED Final   Entamoeba histolytica NOT DETECTED NOT DETECTED Final   Giardia lamblia NOT DETECTED NOT DETECTED Final   Adenovirus F40/41 NOT DETECTED NOT DETECTED Final   Astrovirus NOT DETECTED NOT DETECTED Final   Norovirus GI/GII NOT DETECTED NOT DETECTED Final   Rotavirus A NOT DETECTED NOT DETECTED Final   Sapovirus (I, II, IV, and V) NOT  DETECTED NOT DETECTED Final    Comment: Performed at [facility], [address] [place], [place] [address]         Radiology Studies: Ct Chest Wo Contrast  Result Date: 03/26/2019 CLINICAL DATA:  LEFT hilar fullness.  Short of breath EXAM: CT CHEST WITHOUT CONTRAST TECHNIQUE: Multidetector CT imaging of the chest was performed following the standard protocol without IV contrast. COMPARISON:  None. FINDINGS: Cardiovascular: Central venous line tip in the distal SVC. No pericardial effusion. Mediastinum/Nodes: No axillary or supraclavicular adenopathy. No mediastinal adenopathy. Fullness in LEFT hilum is difficult to assess without IV contrast. Lungs/Pleura: Moderate LEFT effusion with passive atelectasis of the LEFT lower lobe. No clear obstructing lesion identified. Smaller effusion on the RIGHT. Upper Abdomen: Limited view of the liver, kidneys, pancreas are unremarkable. Normal adrenal glands. Musculoskeletal: No aggressive osseous lesion. IMPRESSION: 1. Moderate to large LEFT effusion with passive atelectasis of the LEFT lower lobe. No clear mass identified. Fullness in the LEFT hilum is difficult to assess without IV contrast. 2. Small RIGHT effusion. 3. Central venous line in place without complication. Electronically Signed   By: Genevive Bi M.D.   On: 03/26/2019 17:25   Dg Chest Port 1 View  Result Date: 03/28/2019 CLINICAL DATA:  Follow-up pleural effusion EXAM: PORTABLE CHEST 1 VIEW COMPARISON:  03/26/2019 FINDINGS: Cardiac shadow is stable. Left jugular central line is again seen and stable. Increasing density over the left hemithorax is noted consistent with a posteriorly layering effusion. Left basilar atelectasis is noted as well stable from the prior exam. No new focal abnormality is noted. IMPRESSION: Stable changes on the left similar to that seen on prior CT. Electronically Signed   By: Alcide Clever M.D.   On: 03/28/2019 07:22   Dg Chest Port 1 View  Result Date:  03/26/2019 CLINICAL DATA:  Left central line placement EXAM: PORTABLE CHEST 1 VIEW COMPARISON:  03/25/2019 FINDINGS: Left central line has been placed with the tip in the SVC. No pneumothorax. Layering left pleural effusion with left lower lobe atelectasis or infiltrate. Low lung volumes. Minimal right base atelectasis. Cardiomegaly, vascular congestion. IMPRESSION: Left central line placement with the tip in the SVC. No pneumothorax. Layering left effusion. Left base atelectasis or infiltrate. Minimal right base atelectasis. Cardiomegaly, vascular congestion. Electronically Signed   By: Charlett Nose M.D.   On: 03/26/2019 15:05        Scheduled Meds: . amLODipine  10 mg Oral Daily  . Chlorhexidine Gluconate Cloth  6 each Topical Daily  . colestipol  1 g Oral BID  . hydroxychloroquine  200 mg Oral BID  . mouth rinse  15 mL Mouth Rinse BID  . metoprolol tartrate  100 mg Oral BID  . pantoprazole (PROTONIX) IV  40 mg Intravenous Q12H  . pneumococcal 23 valent vaccine  0.5 mL Intramuscular Tomorrow-1000   Continuous Infusions: . sodium chloride 10 mL/hr at 03/27/19 1515  . piperacillin-tazobactam (ZOSYN)  IV 3.375 g (03/28/19 0816)     LOS: 4 days    Time spent: 25 minutes.    Dorcas Carrow, MD [facility] Pager [phone]  If 7PM-7AM, please contact night-coverage [email or web address] Password [facility] 03/28/2019, 9:53 AM

## 2019-03-28 NOTE — Evaluation (Signed)
Physical Therapy Evaluation Patient Details Name: Michelle Graves MRN: [id number] DOB: [birth year] Today's Date: 03/28/2019   History of Present Illness  68 yo female admitted with acute kidney injury, hypovolemic shock, A fib with RVR, N/V with coffee ground emesis. Hx of CKD, RA, obesity, Sjogren's, chronic pain.   Clinical Impression  Limited bed eval only. Pt moaning in discomfort but unable to verbalize location of pain. She only followed 1 command during the brief session. Unable to accurately perform UE/LE assessment 2* lethargy/impaired cognition. Pt was not appropriate for any attempts at OOB mobility on today. Will follow and progress activity as tolerated     Follow Up Recommendations SNF    Equipment Recommendations  None recommended by PT    Recommendations for Other Services       Precautions / Restrictions Precautions Precautions: Fall Precaution Comments: A line Restrictions Weight Bearing Restrictions: No      Mobility  Bed Mobility               General bed mobility comments: NT-pt not following commands or responding verbally besides "ok." Eyes closed most of session  Transfers                    Ambulation/Gait                Stairs            Wheelchair Mobility    Modified Rankin (Stroke Patients Only)       Balance                                             Pertinent Vitals/Pain Pain Assessment: Faces Faces Pain Scale: Hurts even more Pain Location: Pt unable to state Pain Descriptors / Indicators: Grimacing;Moaning Pain Intervention(s): Premedicated before session(per RN)    Home Living Family/patient expects to be discharged to:: Unsure                 Additional Comments: pt unable to provide any info    Prior Function           Comments: pt unable to provide any info     Hand Dominance        Extremity/Trunk Assessment   Upper Extremity Assessment Upper  Extremity Assessment: Difficult to assess due to impaired cognition    Lower Extremity Assessment Lower Extremity Assessment: Difficult to assess due to impaired cognition       Communication      Cognition Arousal/Alertness: Lethargic Behavior During Therapy: Restless Overall Cognitive Status: Difficult to assess                                        General Comments      Exercises     Assessment/Plan    PT Assessment Patient needs continued PT services  PT Problem List Decreased strength;Decreased cognition;Decreased balance;Decreased mobility;Decreased activity tolerance;Decreased coordination;Decreased knowledge of use of DME;Decreased safety awareness;Pain       PT Treatment Interventions DME instruction;Therapeutic activities;Gait training;Functional mobility training;Balance training;Patient/family education;Therapeutic exercise    PT Goals (Current goals can be found in the Care Plan section)  Acute Rehab PT Goals Patient Stated Goal: pt unable to state PT Goal Formulation: Patient unable to participate in goal setting Time For  Goal Achievement: 04/11/19 Potential to Achieve Goals: Fair    Frequency Min 3X/week   Barriers to discharge        Co-evaluation               AM-PAC PT "6 Clicks" Mobility  Outcome Measure Help needed turning from your back to your side while in a flat bed without using bedrails?: Total Help needed moving from lying on your back to sitting on the side of a flat bed without using bedrails?: Total Help needed moving to and from a bed to a chair (including a wheelchair)?: Total Help needed standing up from a chair using your arms (e.g., wheelchair or bedside chair)?: Total Help needed to walk in hospital room?: Total Help needed climbing 3-5 steps with a railing? : Total 6 Click Score: 6    End of Session   Activity Tolerance: Patient limited by lethargy Patient left: in bed;with call bell/phone within  reach;with bed alarm set   PT Visit Diagnosis: Muscle weakness (generalized) (M62.81);Difficulty in walking, not elsewhere classified (R26.2)    Time: 1339-1350 PT Time Calculation (min) (ACUTE ONLY): 11 min   Charges:   PT Evaluation $PT Eval Moderate Complexity: 1 Mod           Rebeca Alert, PT Acute Rehabilitation Services Pager: [phone] Office: [phone]

## 2019-03-28 NOTE — Progress Notes (Signed)
[facility] Gastroenterology Progress Note  Michelle Graves 68 y.o. 1951-04-11  CC: GI bleed/colitis   Subjective: Patient with intermittent confusion.  Discussed with nursing staff.  No further bleeding episodes.  Continues to have diarrhea with few bowel movements per day.  ROS : Not able to obtain   Objective: Vital signs in last 24 hours: Vitals:   03/28/19 0700 03/28/19 0800  BP: (!) 165/99 (!) 142/106  Pulse: 97 (!) 102  Resp: 18 20  Temp:    SpO2: 100% 97%    Physical Exam:  General:   Resting comfortably in the bed.  Head:  Normocephalic, without obvious abnormality, atraumatic     Lungs:    Anterior exam only.  Occasional rhonchi.  Heart:  Regular rate and rhythm, tachycardia  Abdomen:    Mild distended, nontender, bowel sounds present.  No peritoneal signs  Extremities: Extremities normal, atraumatic, no  edema       Lab Results: Recent Labs    03/26/19 0405 03/27/19 0401 03/28/19 0358  NA 140 142 142  K 4.2 3.4* 4.1  CL 110 108 103  CO2 20* 24 29  GLUCOSE 93 72 92  BUN 70* 52* 45*  CREATININE 2.85* 2.16* 2.02*  CALCIUM 6.5* 7.3* 8.7*  MG 2.0  --   --   PHOS 5.0*  --   --    Recent Labs    03/25/19 1616  AST 29  ALT 16  ALKPHOS 64  BILITOT 0.9  PROT 5.4*  ALBUMIN 2.4*   Recent Labs    03/27/19 0401 03/28/19 0358  WBC 13.3* 11.7*  NEUTROABS 11.4* 9.2*  HGB 9.4* 10.3*  HCT 29.5* 31.0*  MCV 95.5 93.4  PLT 110* 105*   No results for input(s): LABPROT, INR in the last 72 hours.    Assessment/Plan: -Coffee-ground emesis.  Resolved.  No further bleeding episodes -Acute blood loss anemia.  Hemoglobin stable now.   No further bleeding episodes. -Abnormal CT scan showing colitis.  Wall thickening involving distal transverse colon, descending colon and rectosigmoid colon. -A. fib.  Converted back to sinus rhythm.   Recommendations ------------------------ -C. difficile negative.  GI pathogen negative.  -Continue antibiotics.   -Advance  diet to soft  -Start Colestid for diarrhea. -Hold off on EGD for now given lack of further bleeding.  Recommend outpatient EGD and colonoscopy once acute issues are resolved. -Continue PPI.  Monitor H&H. -GI will follow  Kathi Der MD, FACP 03/28/2019, 8:38 AM  Contact #  [phone]

## 2019-03-29 ENCOUNTER — Inpatient Hospital Stay (HOSPITAL_COMMUNITY): Payer: Medicare Other

## 2019-03-29 ENCOUNTER — Encounter (HOSPITAL_COMMUNITY): Payer: Self-pay | Admitting: Radiology

## 2019-03-29 DIAGNOSIS — R410 Disorientation, unspecified: Secondary | ICD-10-CM

## 2019-03-29 LAB — CBC WITH DIFFERENTIAL/PLATELET
Abs Immature Granulocytes: 0.19 10*3/uL — ABNORMAL HIGH (ref 0.00–0.07)
Basophils Absolute: 0.1 10*3/uL (ref 0.0–0.1)
Basophils Relative: 0 %
Eosinophils Absolute: 0.1 10*3/uL (ref 0.0–0.5)
Eosinophils Relative: 1 %
HCT: 32 % — ABNORMAL LOW (ref 36.0–46.0)
Hemoglobin: 10.3 g/dL — ABNORMAL LOW (ref 12.0–15.0)
Immature Granulocytes: 2 %
Lymphocytes Relative: 11 %
Lymphs Abs: 1.3 10*3/uL (ref 0.7–4.0)
MCH: 29.8 pg (ref 26.0–34.0)
MCHC: 32.2 g/dL (ref 30.0–36.0)
MCV: 92.5 fL (ref 80.0–100.0)
Monocytes Absolute: 1.2 10*3/uL — ABNORMAL HIGH (ref 0.1–1.0)
Monocytes Relative: 10 %
Neutro Abs: 9 10*3/uL — ABNORMAL HIGH (ref 1.7–7.7)
Neutrophils Relative %: 76 %
Platelets: 109 10*3/uL — ABNORMAL LOW (ref 150–400)
RBC: 3.46 MIL/uL — ABNORMAL LOW (ref 3.87–5.11)
RDW: 14.8 % (ref 11.5–15.5)
WBC: 11.8 10*3/uL — ABNORMAL HIGH (ref 4.0–10.5)
nRBC: 0 % (ref 0.0–0.2)

## 2019-03-29 LAB — BLOOD GAS, ARTERIAL
Acid-Base Excess: 5.8 mmol/L — ABNORMAL HIGH (ref 0.0–2.0)
Bicarbonate: 28.7 mmol/L — ABNORMAL HIGH (ref 20.0–28.0)
Drawn by: 257701
O2 Content: 2 L/min
O2 Saturation: 91.2 %
Patient temperature: 99.1
pCO2 arterial: 36.2 mmHg (ref 32.0–48.0)
pH, Arterial: 7.511 — ABNORMAL HIGH (ref 7.350–7.450)
pO2, Arterial: 67.4 mmHg — ABNORMAL LOW (ref 83.0–108.0)

## 2019-03-29 LAB — BASIC METABOLIC PANEL
Anion gap: 14 (ref 5–15)
BUN: 34 mg/dL — ABNORMAL HIGH (ref 8–23)
CO2: 26 mmol/L (ref 22–32)
Calcium: 8.3 mg/dL — ABNORMAL LOW (ref 8.9–10.3)
Chloride: 101 mmol/L (ref 98–111)
Creatinine, Ser: 1.59 mg/dL — ABNORMAL HIGH (ref 0.44–1.00)
GFR calc Af Amer: 39 mL/min — ABNORMAL LOW (ref >60)
GFR calc non Af Amer: 33 mL/min — ABNORMAL LOW (ref >60)
Glucose, Bld: 86 mg/dL (ref 70–99)
Potassium: 3.6 mmol/L (ref 3.5–5.1)
Sodium: 141 mmol/L (ref 135–145)

## 2019-03-29 LAB — AMMONIA: Ammonia: 16 umol/L (ref 9–35)

## 2019-03-29 MED ORDER — LABETALOL HCL 5 MG/ML IV SOLN
10.0000 mg | INTRAVENOUS | Status: DC | PRN
  Administered 2019-03-29: 10 mg via INTRAVENOUS
  Filled 2019-03-29: qty 4

## 2019-03-29 MED ORDER — PANTOPRAZOLE SODIUM 40 MG PO TBEC
40.0000 mg | DELAYED_RELEASE_TABLET | Freq: Two times a day (BID) | ORAL | Status: DC
  Administered 2019-03-29 – 2019-04-04 (×10): 40 mg via ORAL
  Filled 2019-03-29 (×11): qty 1

## 2019-03-29 MED ORDER — GABAPENTIN 100 MG PO CAPS
100.0000 mg | ORAL_CAPSULE | Freq: Three times a day (TID) | ORAL | Status: DC
  Administered 2019-03-30: 100 mg via ORAL
  Filled 2019-03-29: qty 1

## 2019-03-29 NOTE — Progress Notes (Signed)
Ct Head Wo Contrast  Result Date: 03/29/2019 CLINICAL DATA:  Hypotensive. EXAM: CT HEAD WITHOUT CONTRAST TECHNIQUE: Contiguous axial images were obtained from the base of the skull through the vertex without intravenous contrast. COMPARISON:  Head CT 01/25/2007 FINDINGS: Brain: Age advanced cerebral atrophy, ventriculomegaly and significant periventricular white matter disease, likely microvascular ischemic change. Findings progressive since the prior CT scan. Ventricles are in the midline and demonstrate normal configuration. No extra-axial fluid collections are identified. No CT findings for acute intracranial process such as hemispheric infarction or intracranial hemorrhage. No mass lesions are identified. The brainstem and cerebellum are grossly normal. Vascular: Moderate vascular calcifications but no definite aneurysm or hyperdense vessels. Skull: No skull fracture or worrisome bone lesions. Sinuses/Orbits: The paranasal sinuses and mastoid air cells are grossly clear. The globes are intact. Other: No scalp lesions or hematoma. IMPRESSION: 1. Age advanced cerebral atrophy, ventriculomegaly and significant periventricular white matter disease. 2. No acute intracranial findings or mass lesion. Electronically Signed   By: Rudie Meyer M.D.   On: 03/29/2019 13:27    Non specific findings on CT head.  Will defer further management of delirium to primary team.    PCCM will sign off.  Please call if additional help needed.  Coralyn Helling, MD [facility] Pulmonary/Critical Care 03/29/2019, 3:16 PM

## 2019-03-29 NOTE — Progress Notes (Signed)
Patient remains encephalopathic, apathetic at times and delirious. Blood gas analysis was fairly normal. She is following simple commands with inconsistent findings on the left upper arm. Ammonia was normal.  Renal functions improving. CT head is essentially normal for any significant infarctions or acute changes.  Though her because of encephalopathy most likely is metabolic.  CT scan of the head after more than 3 days of symptoms is not showing any acute finding.  Because of persistent encephalopathy, will order MRI of the brain to evaluate if she suffered any small stroke. Patient is currently delirious, will try MRI tomorrow morning.

## 2019-03-29 NOTE — Progress Notes (Signed)

## 2019-03-29 NOTE — NC FL2 (Signed)
[place] MEDICAID FL2 LEVEL OF CARE SCREENING TOOL     IDENTIFICATION  Patient Name: Michelle Graves Birthdate: [birth year] Sex: female Admission Date (Current Location): 03/24/2019  [facility] and [place] Number:  Producer, television/film/video and Address:  [facility],  [address], [place] [address]      Provider Number: [id number]  Attending Physician Name and Address:  Dorcas Carrow, MD  Relative Name and Phone Number:       Current Level of Care: Hospital Recommended Level of Care: Skilled Nursing Facility Prior Approval Number:    Date Approved/Denied:   PASRR Number: [id number]  Discharge Plan: SNF    Current Diagnoses: Patient Active Problem List   Diagnosis Date Noted  . New onset a-fib (HCC) 03/27/2019  . Acute kidney injury superimposed on CKD (HCC) 03/24/2019  . Hypotension 03/24/2019  . Dark emesis 03/24/2019  . Dark stools 03/24/2019  . Diverticulitis of intestine without perforation or abscess without bleeding   . Sinus tachycardia 08/27/2016  . Diverticulitis 08/26/2016  . HYPERTENSION, MALIGNANT ESSENTIAL 06/24/2007  . KIDNEY DISEASE, CHRONIC, STAGE III 06/24/2007  . ARTHRITIS, RHEUMATOID, SEROPOSITIVE 06/24/2007    Orientation RESPIRATION BLADDER Height & Weight     Self, Place  Normal Incontinent Weight: 94.1 kg Height:  5\' 7"  (170.2 cm)  BEHAVIORAL SYMPTOMS/MOOD NEUROLOGICAL BOWEL NUTRITION STATUS      Incontinent Diet  AMBULATORY STATUS COMMUNICATION OF NEEDS Skin   Extensive Assist Verbally Normal                       Personal Care Assistance Level of Assistance  Bathing, Dressing, Feeding Bathing Assistance: Maximum assistance Feeding assistance: Limited assistance Dressing Assistance: Maximum assistance     Functional Limitations Info             SPECIAL CARE FACTORS FREQUENCY  PT (By licensed PT), OT (By licensed OT)     PT Frequency: 5x week OT Frequency: 5x week            Contractures  Contractures Info: Not present    Additional Factors Info  Code Status Code Status Info: DNR Allergies Info: NKDA           Current Medications (03/29/2019):  This is the current hospital active medication list Current Facility-Administered Medications  Medication Dose Route Frequency Provider Last Rate Last Dose  . 0.9 %  sodium chloride infusion   Intravenous Continuous Coralyn Helling, MD   Stopped at 03/28/19 [phone]  . acetaminophen (TYLENOL) tablet 650 mg  650 mg Oral Q6H PRN Narda Bonds, MD       Or  . acetaminophen (TYLENOL) suppository 650 mg  650 mg Rectal Q6H PRN Narda Bonds, MD      . amLODipine (NORVASC) tablet 10 mg  10 mg Oral Daily Ollis, Brandi L, NP   10 mg at 03/29/19 0930  . Chlorhexidine Gluconate Cloth 2 % PADS 6 each  6 each Topical Daily Narda Bonds, MD   6 each at 03/29/19 [phone]  . cloNIDine (CATAPRES) tablet 0.1 mg  0.1 mg Oral BID Dorcas Carrow, MD   0.1 mg at 03/29/19 0931  . colestipol (COLESTID) tablet 1 g  1 g Oral BID Brahmbhatt, Parag, MD   1 g at 03/29/19 0931  . furosemide (LASIX) tablet 40 mg  40 mg Oral Daily Dorcas Carrow, MD   40 mg at 03/29/19 0936  . gabapentin (NEURONTIN) capsule 100 mg  100 mg Oral TID  Dorcas Carrow, MD      . HYDROcodone-acetaminophen [facility]) 10-325 MG per tablet 1 tablet  1 tablet Oral Q6H PRN Narda Bonds, MD   1 tablet at 03/29/19 0931  . hydroxychloroquine (PLAQUENIL) tablet 200 mg  200 mg Oral BID Narda Bonds, MD   200 mg at 03/29/19 0931  . MEDLINE mouth rinse  15 mL Mouth Rinse BID Narda Bonds, MD   15 mL at 03/29/19 0932  . metoprolol tartrate (LOPRESSOR) tablet 100 mg  100 mg Oral BID Dorcas Carrow, MD   100 mg at 03/29/19 0931  . morphine 2 MG/ML injection 2-4 mg  2-4 mg Intravenous Q3H PRN Deterding, Dorise Hiss, MD   4 mg at 03/29/19 0414  . ondansetron (ZOFRAN) tablet 4 mg  4 mg Oral Q6H PRN Narda Bonds, MD       Or  . ondansetron [facility]) injection 4 mg  4 mg Intravenous Q6H PRN Narda Bonds, MD   4 mg at 03/28/19 1957  . pantoprazole (PROTONIX) injection 40 mg  40 mg Intravenous Q12H Narda Bonds, MD   40 mg at 03/29/19 0930  . piperacillin-tazobactam (ZOSYN) IVPB 3.375 g  3.375 g Intravenous Q8H Wofford, Drew A, RPH 12.5 mL/hr at 03/29/19 0806 3.375 g at 03/29/19 0806  . pneumococcal 23 valent vaccine (PNU-IMMUNE) injection 0.5 mL  0.5 mL Intramuscular Tomorrow-1000 Dorcas Carrow, MD         Discharge Medications: Please see discharge summary for a list of discharge medications.  Relevant Imaging Results:  Relevant Lab Results:   Additional Information SSN [id number]    Armanda Heritage, RN

## 2019-03-29 NOTE — Progress Notes (Signed)
Given Labetalol 10 mg over 3-4 minutes.  Noted pauses in pt respirations for about 30 minutes.  Will continue to monitor.

## 2019-03-29 NOTE — Progress Notes (Addendum)
NAME:  Michelle Graves, MRN:  [id number], DOB:  12-Feb-1951, LOS: 5 ADMISSION DATE:  03/24/2019, CONSULTATION DATE:  03/24/2019 REFERRING MD:  Jerral Ralph CHIEF COMPLAINT:  Hypotension, shock   Brief History   68 y/o female presented with hematemesis, melena, abdominal pain x 3 days.  Remained hypotensive and PCCM asked to assess.  Past Medical History  HTN, Diverticulitis, RA on prednisone/enbrel/plaquenil, CKD 3, HLD, Depression, Chronic pain, Insomnia, Nocturia, Sjogren's syndrome  Significant Hospital Events   5/28 Admit 5/29 Start pressors; SVT tx with adenosine/amiodarone/cardizem 5/30 ABx for colitis; off pressors.  On cardizem, supplemental O2.  Afebrile. 5.1L + balance 6/01 Ongoing delirium but improving   Consults:  Nephrology 5/28 AKI GI 5/29 hematemesis  Procedures:  Rt IJ CVL 5/29 >>5/30 Rt radial aline 5/29 >> 5/31 Lt IJ CVL 5/30 >>  Significant Diagnostic Tests:  Renal u/s 5/28 >> increased renal echogenicity and renal cortical thinning CT abd/pelvis 5/30 >> changes of colitis CT chest 5/30 >> mod Lt effusion, small Rt effusion   Micro Data:  COVID 5/28 >> negative Blood 5/29 >>  C diff 5/30 >> negative GI PCR 5/30 >> negative   Antimicrobials:  Zosyn 5/30 >>   Interim history/subjective:  Tmax 99.5.  I/O - 2.9L UOP in 24h, net neg 1.7L in 24h. RN reports pt more alert / eating breakfast this am but remains confused. Remains in SR/ST  Objective   Blood pressure (!) 149/94, pulse (!) 103, temperature 99.5 F (37.5 C), temperature source Axillary, resp. rate 19, height 5\' 7"  (1.702 m), weight 94.1 kg, SpO2 100 %. CVP:  [7 mmHg] 7 mmHg      Intake/Output Summary (Last 24 hours) at 03/29/2019 0805 Last data filed at 03/29/2019 0421 Gross per 24 hour  Intake 1130.47 ml  Output 2900 ml  Net -1769.53 ml   Filed Weights   03/27/19 0433 03/28/19 0400 03/29/19 0353  Weight: 100.6 kg 99.7 kg 94.1 kg   Examination: General: ill appearing adult female sitting up  in bed eating breakfast  HEENT: MM pink/dry, pupils =/R Neuro: Awake, alert, oriented to self, confused otherwise, MAE, question LUE weakness, normal facial symmetry, pupils =/R CV: s1s2 rrr, no m/r/g PULM: even/non-labored, lungs bilaterally clear  IR:SWNI, non-tender, bsx4 active  Extremities: warm/dry, no edema  Skin: no rashes or lesions  Resolved Hospital Problem list   Hypovolemic shock, melena, hematemesis, metabolic acidosis, lactic acidosis  Assessment & Plan:   Colitis P: ABX as above, D4/x   AKI from hypovolemia, ACE inhibitor, and chronic NSAID use CKD 3 - baseline creatinine 1.72 from 03/15/19 Hypokalemia P: Lasix 40 mg PO QD  Trend BMP / urinary output Replace electrolytes as indicated Avoid nephrotoxic agents as able, ensure adequate renal perfusion  Acute metabolic encephalopathy / hypoactive delirium  Hx of depression, chronic pain. P: Promote sleep / wake cycle Minimize sedating medications as able  PRN norco for pain  PT efforts  Hold home zanaflex, neurontin, trazodone for now  Assess CT head given prolonged AMS   Acute hypoxic respiratory failure from pleural effusion and atelectasis. P: O2 to support saturations > 92% Follow intermittent CXR > 6/2 images personally reviewed, improved small left effusion, low lung volumes Pulmonary hygiene - IS, mobilize Aspiration precautions   A fib with RVR  -new this admit, SR on 6/1 Hx of HTN, HLD P: Lopressor 100 mg BID Continue norvasc, clonidine  HR goal < 110 Hold outpatient ASA, HCTZ, lisinopril   History of rheumatoid arthritis. P: Plaquenil  Hold home enbrel, prednisone   Anemia from GI bleed and chronic disease. P: Trend CBC  Transfuse per ICU guidelines   Best practice:  DVT prophylaxis: SCDs SUP: Protonix Nutrition: clear liquids Code status: DNR Disposition: SDU  PCCM will be available PRN.  Please call back if new needs arise.   Labs    CMP Latest Ref Rng & Units 03/29/2019  03/28/2019 03/27/2019  Glucose 70 - 99 mg/dL 86 92 72  BUN 8 - 23 mg/dL 19(E) 17(E) 08(X)  Creatinine 0.44 - 1.00 mg/dL 4.48(J) 8.56(D) 1.49(F)  Sodium 135 - 145 mmol/L 141 142 142  Potassium 3.5 - 5.1 mmol/L 3.6 4.1 3.4(L)  Chloride 98 - 111 mmol/L 101 103 108  CO2 22 - 32 mmol/L 26 29 24   Calcium 8.9 - 10.3 mg/dL 8.3(L) 8.7(L) 7.3(L)  Total Protein 6.5 - 8.1 g/dL - - -  Total Bilirubin 0.3 - 1.2 mg/dL - - -  Alkaline Phos 38 - 126 U/L - - -  AST 15 - 41 U/L - - -  ALT 0 - 44 U/L - - -   CBC Latest Ref Rng & Units 03/29/2019 03/28/2019 03/27/2019  WBC 4.0 - 10.5 K/uL 11.8(H) 11.7(H) 13.3(H)  Hemoglobin 12.0 - 15.0 g/dL 10.3(L) 10.3(L) 9.4(L)  Hematocrit 36.0 - 46.0 % 32.0(L) 31.0(L) 29.5(L)  Platelets 150 - 400 K/uL 109(L) 105(L) 110(L)   CBG (last 3)  No results for input(s): GLUCAP in the last 72 hours.   Canary Brim, NP-C [facility] Pgr: [phone] or if no answer [phone] 03/29/2019, 8:05 AM

## 2019-03-29 NOTE — TOC Initial Note (Signed)
Transition of Care [facility]) - Initial/Assessment Note    Patient Details  Name: Michelle Graves MRN: [id number] Date of Birth: [birth year]  [facility] CM/SW Contact:    Armanda Heritage, RN Phone Number: 03/29/2019, 11:06 AM  Clinical Narrative:      CM spoke with patient at bedside, patient agreeable to explore options for SNF at discharge. FL2 faxed to area facilities.              Expected Discharge Plan: Skilled Nursing Facility Barriers to Discharge: Continued Medical Work up   Patient Goals and CMS Choice        Expected Discharge Plan and Services Expected Discharge Plan: Skilled Nursing Facility   Discharge Planning Services: CM Consult   Living arrangements for the past 2 months: Apartment                                      Prior Living Arrangements/Services Living arrangements for the past 2 months: Apartment Lives with:: Self Patient language and need for interpreter reviewed:: Yes Do you feel safe going back to the place where you live?: Yes      Need for Family Participation in Patient Care: Yes (Comment) Care giver support system in place?: Yes (comment)   Criminal Activity/Legal Involvement Pertinent to Current Situation/Hospitalization: No - Comment as needed  Activities of Daily Living Home Assistive Devices/Equipment: Dan Humphreys (specify type) ADL Screening (condition at time of admission) Patient's cognitive ability adequate to safely complete daily activities?: Yes Is the patient deaf or have difficulty hearing?: No Does the patient have difficulty seeing, even when wearing glasses/contacts?: No Does the patient have difficulty concentrating, remembering, or making decisions?: No Patient able to express need for assistance with ADLs?: Yes Does the patient have difficulty dressing or bathing?: No Independently performs ADLs?: Yes (appropriate for developmental age) Does the patient have difficulty walking or climbing  stairs?: No Weakness of Legs: None Weakness of Arms/Hands: None  Permission Sought/Granted Permission sought to share information with : Facility Contact Representative(will fax FL2 to area facilities) Permission granted to share information with : Yes, Verbal Permission Granted              Emotional Assessment Appearance:: Appears stated age     Orientation: : Fluctuating Orientation (Suspected and/or reported Sundowners)   Psych Involvement: No (comment)  Admission diagnosis:  AKI (acute kidney injury) (HCC) [N17.9] Acute renal failure, unspecified acute renal failure type (HCC) [N17.9] Non-intractable vomiting with nausea, unspecified vomiting type [R11.2] Hypotension due to hypovolemia [I95.89, E86.1] Patient Active Problem List   Diagnosis Date Noted  . New onset a-fib (HCC) 03/27/2019  . Acute kidney injury superimposed on CKD (HCC) 03/24/2019  . Hypotension 03/24/2019  . Dark emesis 03/24/2019  . Dark stools 03/24/2019  . Diverticulitis of intestine without perforation or abscess without bleeding   . Sinus tachycardia 08/27/2016  . Diverticulitis 08/26/2016  . HYPERTENSION, MALIGNANT ESSENTIAL 06/24/2007  . KIDNEY DISEASE, CHRONIC, STAGE III 06/24/2007  . ARTHRITIS, RHEUMATOID, SEROPOSITIVE 06/24/2007   PCP:  [facility]:   [id number] - [name], [place] - [address] AT [facility] [address] [place] [address] Phone: [phone] Fax: [phone]     Social Determinants of Health (SDOH) Interventions    Readmission Risk Interventions No flowsheet data found.

## 2019-03-29 NOTE — Progress Notes (Signed)
PROGRESS NOTE    Michelle Graves  QMV:[id number] DOB: 05/19/1951 DOA: 03/24/2019 PCP: [facility]    Brief Narrative:  Patient is 68 year old female with history of hypertension, stage III CKD with reported baseline creatinine of 1.6, rheumatoid arthritis who presented to the hospital with nausea, black emesis and abdominal pain for about 3 days.  In the emergency room, patient was afebrile, heart rate 100, respiratory 20, blood pressures 80/50.  Creatinine of 5.04 and bilirubin of 1.6.  Hemoglobin was stable at 14.  She was given Protonix, fluid boluses and admitted for acute renal failure.  03/25/2019: Persistent low blood pressures, low perfusion and urine output and poor venous access. Transfer to ICU, central line and arterial line placed, further resuscitation with fluid with improvement.  Also received Neo-Synephrine overnight. CT scan shows diffuse colitis.  Patient had loose bowel movements.   No recent antibiotic use.  03/26/2019: Confusion and agitation, central line pulled out.  SVT followed by rapid A. fib converted to sinus with Cardizem drip.  03/27/2019: Has developed delirium, biochemically improving.  No positive culture data.  C. difficile and GI pathogen panel negative.  Also has left-sided pleural effusion, pulmonary recommended treatment with diuretics.   Assessment & Plan:   Principal Problem:   Acute kidney injury superimposed on CKD (HCC) Active Problems:   HYPERTENSION, MALIGNANT ESSENTIAL   KIDNEY DISEASE, CHRONIC, STAGE III   Hypotension   Dark emesis   Dark stools   New onset a-fib (HCC)  Acute kidney injury with history of hypertensive kidney disease stage III: Baseline creatinine about 1.2-1.6. Prerenal due to severe dehydration.  Also with use of NSAID'sAnd ongoing use of ACE inhibitor. Treated with IV fluid boluses. Patient needed vasopressor support transiently, now off. Continue strict intake and output  monitoring. Renal ultrasound shows chronic renal disease, no hydronephrosis or obstruction. Renal functions continues to improve.  Urine output is adequate. Starting on Lasix with improvement.   Hypovolemic shock with nausea, vomiting with coffee-ground emesis: Sepsis present on admission.  Ruled in. Hb base line around 12-10.3. Probably peptic ulcer disease.  Aggravated by use of NSAID's and prednisone. Hemoglobin has remained stable with some drop with hemodilution.  No evidence of ongoing bleed.  Remains on Protonix twice daily.  CT scan without contrast showed transverse and descending colon colitis.  No recent antibiotic use. Blood cultures with no growth so far. C. difficile negative.  GI pathogen panel negative. Due to significant symptoms, started on Zosyn. Will continue today.  If continues to have no evidence of infection, will discontinue tomorrow. GI recommending conservative management and follow-up endoscopic evaluation after clinical improvement.  Lactic acidosis: Probably due to severe dehydration and/or sepsis. Normalized.  New onset A. fib with RVR: Converted to sinus rhythm.  Was treated with Cardizem drip.  Now on oral metoprolol.   Blood pressures improving. Minimal troponin elevation suggestive of type II injury due to tachycardia. 2D echocardiogram is fairly normal.  No new wall motion abnormalities. Patient developed rapid A. fib in the setting of sepsis and has remained sinus rhythm since then.  Will need outpatient endoscopic evaluation after stabilization.  Will not restart on anticoagulation at this time.  Essential hypertension:  Blood pressures picking up now.  Resumed amlodipine.  Resumed clonidine and now better controlled.  Rheumatoid arthritis:  On Plaquenil that we will continue.  Recently completed a steroid burst.  Avoid NSAID's.  Takes Enbrel every week, on hold for acute infection.  ICU delirium: Delirium  prevention.  Fall precautions.  Avoid  benzodiazepines.  Advance activities.  Patient was on gabapentin, apparently on hold, will resume on low doses.  Acute hypoxic respiratory failure: Developed pleural effusion with fluid overload after massive resuscitation.  Clinically improving with Lasix that we will continue.  Given oxygen to keep saturation more than 90%.  Pulmonology did not recommend thoracentesis.   DVT prophylaxis: SCDs Code Status: DNR Family Communication: patient's daughter informed and updated every day. Disposition Plan: Stepdown unit today    Consultants:   Nephrology, signed off   GI, sing off  PCCM, signed off   Procedures:   CvC and arterial line  Antimicrobials:   Zosyn, 03/26/2019 ---    Subjective: Patient seen and examined. Blood pressures up. Sinus rhythm.  Afebrile.  Some confusion and was given soft mittens to avoid pulling on the lines.   Objective: Vitals:   03/29/19 0500 03/29/19 0628 03/29/19 0700 03/29/19 0800  BP: (!) 145/98 (!) 150/98 (!) 149/94   Pulse: 96 100 (!) 103   Resp: (!) 22 18 19    Temp:    98.2 F (36.8 C)  TempSrc:    Oral  SpO2: 98% 99% 100%   Weight:      Height:        Intake/Output Summary (Last 24 hours) at 03/29/2019 1019 Last data filed at 03/29/2019 0836 Gross per 24 hour  Intake 1190.47 ml  Output 2900 ml  Net -1709.53 ml   Filed Weights   03/27/19 0433 03/28/19 0400 03/29/19 0353  Weight: 100.6 kg 99.7 kg 94.1 kg    Examination: Physical Exam  Constitutional: No distress.  Sleepy and lethargic.  Not in any distress.  Chronically sick looking.  HENT:  Head: Normocephalic and atraumatic.  Mouth/Throat: Oropharynx is clear and moist.  Eyes: Pupils are equal, round, and reactive to light. Conjunctivae are normal.  Neck: Normal range of motion. Neck supple.  Cardiovascular: Normal rate, regular rhythm and normal heart sounds.  Pulmonary/Chest: Effort normal and breath sounds normal.  Some conducted airway sounds.  Abdominal: Soft. Bowel  sounds are normal. She exhibits no mass. There is no guarding.  Mild diffuse tenderness.  Musculoskeletal: Normal range of motion.        General: No deformity or edema.  Lymphadenopathy:    She has no cervical adenopathy.  Neurological: She is alert.  Patient was sleepy.  Alert oriented x2.  Skin: Skin is warm and dry.  Psychiatric: Mood and affect normal.     Data Reviewed: I have personally reviewed following labs and imaging studies  CBC: Recent Labs  Lab 03/24/19 1309  03/25/19 1616 03/26/19 0405 03/27/19 0401 03/28/19 0358 03/29/19 0250  WBC 11.6*   < > 10.7* 12.6* 13.3* 11.7* 11.8*  NEUTROABS 10.2*  --  9.1*  --  11.4* 9.2* 9.0*  HGB 14.6   < > 10.4* 9.7* 9.4* 10.3* 10.3*  HCT 46.2*   < > 32.5* 30.5* 29.5* 31.0* 32.0*  MCV 98.1   < > 96.2 96.8 95.5 93.4 92.5  PLT 200   < > 137* 130* 110* 105* 109*   < > = values in this interval not displayed.   Basic Metabolic Panel: Recent Labs  Lab 03/25/19 1616 03/26/19 0405 03/27/19 0401 03/28/19 0358 03/29/19 0250  NA 136 140 142 142 141  K 4.5 4.2 3.4* 4.1 3.6  CL 105 110 108 103 101  CO2 20* 20* 24 29 26   GLUCOSE 79 93 72 92 86  BUN 77*  70* 52* 45* 34*  CREATININE 3.12* 2.85* 2.16* 2.02* 1.59*  CALCIUM 6.6* 6.5* 7.3* 8.7* 8.3*  MG  --  2.0  --   --   --   PHOS  --  5.0*  --   --   --    GFR: Estimated Creatinine Clearance: 41 mL/min (A) (by C-G formula based on SCr of 1.59 mg/dL (H)). Liver Function Tests: Recent Labs  Lab 03/24/19 1309 03/25/19 1616  AST 35 29  ALT 20 16  ALKPHOS 93 64  BILITOT 1.6* 0.9  PROT 8.8* 5.4*  ALBUMIN 4.4 2.4*   No results for input(s): LIPASE, AMYLASE in the last 168 hours. Recent Labs  Lab 03/25/19 1747  AMMONIA 27   Coagulation Profile: Recent Labs  Lab 03/24/19 1309  INR 1.0   Cardiac Enzymes: Recent Labs  Lab 03/26/19 0612 03/26/19 1238 03/27/19 0406  TROPONINI 0.03* <0.03 0.32*   BNP (last 3 results) No results for input(s): PROBNP in the last 8760  hours. HbA1C: No results for input(s): HGBA1C in the last 72 hours. CBG: Recent Labs  Lab 03/24/19 1248  GLUCAP 158*   Lipid Profile: No results for input(s): CHOL, HDL, LDLCALC, TRIG, CHOLHDL, LDLDIRECT in the last 72 hours. Thyroid Function Tests: No results for input(s): TSH, T4TOTAL, FREET4, T3FREE, THYROIDAB in the last 72 hours. Anemia Panel: No results for input(s): VITAMINB12, FOLATE, FERRITIN, TIBC, IRON, RETICCTPCT in the last 72 hours. Sepsis Labs: Recent Labs  Lab 03/24/19 1821 03/24/19 2255 03/25/19 1616  LATICACIDVEN 6.6* 5.8* 1.8    Recent Results (from the past 240 hour(s))  SARS Coronavirus 2 (CEPHEID - Performed in [facility] hospital lab), Hosp Order     Status: None   Collection Time: 03/24/19  1:13 PM  Result Value Ref Range Status   SARS Coronavirus 2 NEGATIVE NEGATIVE Final    Comment: (NOTE) If result is NEGATIVE SARS-CoV-2 target nucleic acids are NOT DETECTED. The SARS-CoV-2 RNA is generally detectable in upper and lower  respiratory specimens during the acute phase of infection. The lowest  concentration of SARS-CoV-2 viral copies this assay can detect is 250  copies / mL. A negative result does not preclude SARS-CoV-2 infection  and should not be used as the sole basis for treatment or other  patient management decisions.  A negative result may occur with  improper specimen collection / handling, submission of specimen other  than nasopharyngeal swab, presence of viral mutation(s) within the  areas targeted by this assay, and inadequate number of viral copies  (<250 copies / mL). A negative result must be combined with clinical  observations, patient history, and epidemiological information. If result is POSITIVE SARS-CoV-2 target nucleic acids are DETECTED. The SARS-CoV-2 RNA is generally detectable in upper and lower  respiratory specimens dur ing the acute phase of infection.  Positive  results are indicative of active infection with  SARS-CoV-2.  Clinical  correlation with patient history and other diagnostic information is  necessary to determine patient infection status.  Positive results do  not rule out bacterial infection or co-infection with other viruses. If result is PRESUMPTIVE POSTIVE SARS-CoV-2 nucleic acids MAY BE PRESENT.   A presumptive positive result was obtained on the submitted specimen  and confirmed on repeat testing.  While 2019 novel coronavirus  (SARS-CoV-2) nucleic acids may be present in the submitted sample  additional confirmatory testing may be necessary for epidemiological  and / or clinical management purposes  to differentiate between  SARS-CoV-2 and other Sarbecovirus  currently known to infect humans.  If clinically indicated additional testing with an alternate test  methodology [phone]) is advised. The SARS-CoV-2 RNA is generally  detectable in upper and lower respiratory sp ecimens during the acute  phase of infection. The expected result is Negative. Fact Sheet for Patients:  BoilerBrush.com.cy Fact Sheet for Healthcare Providers: https://pope.com/ This test is not yet approved or cleared by the [place] FDA and has been authorized for detection and/or diagnosis of SARS-CoV-2 by FDA under an Emergency Use Authorization (EUA).  This EUA will remain in effect (meaning this test can be used) for the duration of the COVID-19 declaration under Section 564(b)(1) of the Act, 21 U.S.C. section 360bbb-3(b)(1), unless the authorization is terminated or revoked sooner. Performed at [facility], [address] [place], [place] [address]   MRSA PCR Screening     Status: None   Collection Time: 03/24/19  8:50 PM  Result Value Ref Range Status   MRSA by PCR NEGATIVE NEGATIVE Final    Comment:        The GeneXpert MRSA Assay (FDA approved for NASAL specimens only), is one component of a comprehensive MRSA  colonization surveillance program. It is not intended to diagnose MRSA infection nor to guide or monitor treatment for MRSA infections. Performed at [facility], [address] [place], [place] [address]   Culture, blood (routine x 2)     Status: None (Preliminary result)   Collection Time: 03/25/19  6:40 PM  Result Value Ref Range Status   Specimen Description   Final    BLOOD CENTRAL LINE Performed at [facility], [address] [place], [place] [address]    Special Requests   Final    BOTTLES DRAWN AEROBIC AND ANAEROBIC Blood Culture adequate volume Performed at [facility], [address], [place], [place] [address]    Culture   Final    NO GROWTH 4 DAYS Performed at [facility], [address], [place], [place] [address]    Report Status PENDING  Incomplete  Culture, blood (routine x 2)     Status: None (Preliminary result)   Collection Time: 03/25/19  6:54 PM  Result Value Ref Range Status   Specimen Description   Final    BLOOD ARTERIAL Performed at [facility], [address], [place], [place] [address]    Special Requests   Final    BOTTLES DRAWN AEROBIC AND ANAEROBIC Blood Culture adequate volume Performed at [facility], [address] [place], [place] [address]    Culture   Final    NO GROWTH 4 DAYS Performed at [facility], [address], [place], [place] [address]    Report Status PENDING  Incomplete  C difficile quick scan w PCR reflex     Status: None   Collection Time: 03/26/19  8:21 AM  Result Value Ref Range Status   C Diff antigen NEGATIVE NEGATIVE Final   C Diff toxin NEGATIVE NEGATIVE Final   C Diff interpretation No C. difficile detected.  Final    Comment: Performed at [facility], [address] [place], [place] [address]  Gastrointestinal Panel by PCR , Stool     Status: None   Collection Time: 03/26/19  12:00 PM  Result Value Ref Range Status   Campylobacter species NOT DETECTED NOT DETECTED Final   Plesimonas shigelloides NOT DETECTED NOT DETECTED Final   Salmonella species  NOT DETECTED NOT DETECTED Final   Yersinia enterocolitica NOT DETECTED NOT DETECTED Final   Vibrio species NOT DETECTED NOT DETECTED Final   Vibrio cholerae NOT DETECTED NOT DETECTED Final   Enteroaggregative E coli (EAEC) NOT DETECTED NOT DETECTED Final   Enteropathogenic E coli (EPEC) NOT DETECTED NOT DETECTED Final   Enterotoxigenic E coli (ETEC) NOT DETECTED NOT DETECTED Final   Shiga like toxin producing E coli (STEC) NOT DETECTED NOT DETECTED Final   Shigella/Enteroinvasive E coli (EIEC) NOT DETECTED NOT DETECTED Final   Cryptosporidium NOT DETECTED NOT DETECTED Final   Cyclospora cayetanensis NOT DETECTED NOT DETECTED Final   Entamoeba histolytica NOT DETECTED NOT DETECTED Final   Giardia lamblia NOT DETECTED NOT DETECTED Final   Adenovirus F40/41 NOT DETECTED NOT DETECTED Final   Astrovirus NOT DETECTED NOT DETECTED Final   Norovirus GI/GII NOT DETECTED NOT DETECTED Final   Rotavirus A NOT DETECTED NOT DETECTED Final   Sapovirus (I, II, IV, and V) NOT DETECTED NOT DETECTED Final    Comment: Performed at [facility], [address], [place], [place] [address]         Radiology Studies: Dg Chest Port 1 View  Result Date: 03/29/2019 CLINICAL DATA:  Follow-up pleural effusion EXAM: PORTABLE CHEST 1 VIEW COMPARISON:  03/28/2019 FINDINGS: Cardiac shadow remains enlarged. Left jugular central line is again seen and stable. Mild increased density is noted over the left chest consistent with small pleural effusion. The overall inspiratory effort is poor. No bony abnormality is noted. IMPRESSION: Persistent left posterior effusion. Electronically Signed   By: Alcide Clever M.D.   On: 03/29/2019 07:58   Dg Chest Port 1 View  Result Date: 03/28/2019 CLINICAL DATA:  Follow-up pleural effusion EXAM:  PORTABLE CHEST 1 VIEW COMPARISON:  03/26/2019 FINDINGS: Cardiac shadow is stable. Left jugular central line is again seen and stable. Increasing density over the left hemithorax is noted consistent with a posteriorly layering effusion. Left basilar atelectasis is noted as well stable from the prior exam. No new focal abnormality is noted. IMPRESSION: Stable changes on the left similar to that seen on prior CT. Electronically Signed   By: Alcide Clever M.D.   On: 03/28/2019 07:22        Scheduled Meds: . amLODipine  10 mg Oral Daily  . Chlorhexidine Gluconate Cloth  6 each Topical Daily  . cloNIDine  0.1 mg Oral BID  . colestipol  1 g Oral BID  . furosemide  40 mg Oral Daily  . hydroxychloroquine  200 mg Oral BID  . mouth rinse  15 mL Mouth Rinse BID  . metoprolol tartrate  100 mg Oral BID  . pantoprazole (PROTONIX) IV  40 mg Intravenous Q12H  . pneumococcal 23 valent vaccine  0.5 mL Intramuscular Tomorrow-1000   Continuous Infusions: . sodium chloride Stopped (03/28/19 0816)  . piperacillin-tazobactam (ZOSYN)  IV 3.375 g (03/29/19 0806)     LOS: 5 days    Time spent: 25 minutes.    Dorcas Carrow, MD [facility] Pager [phone]  If 7PM-7AM, please contact night-coverage [email or web address] Password [facility] 03/29/2019, 10:19 AM

## 2019-03-29 NOTE — Progress Notes (Signed)
[facility] Gastroenterology Progress Note  Michelle Graves 68 y.o. Aug 22, 1951  CC: GI bleed/colitis   Subjective: She continues to have intermittent confusion.  Discussed with nursing staff.  Streaks of blood seen with diarrhea.  Only one episode of diarrhea today.  ROS : Not able to obtain   Objective: Vital signs in last 24 hours: Vitals:   03/29/19 0800 03/29/19 1147  BP:  (!) 167/111  Pulse:    Resp:  (!) 31  Temp: 98.2 F (36.8 C) 99.3 F (37.4 C)  SpO2:      Physical Exam:  General:   Resting comfortably in the bed.  Head:  Normocephalic, without obvious abnormality, atraumatic     Lungs:    Anterior exam only.  Occasional rhonchi.  Heart:  Regular rate and rhythm,   Abdomen:    Mild distended, nontender, bowel sounds present.  No peritoneal signs  Extremities: Extremities normal, atraumatic, LE edema noted.        Lab Results: Recent Labs    03/28/19 0358 03/29/19 0250  NA 142 141  K 4.1 3.6  CL 103 101  CO2 29 26  GLUCOSE 92 86  BUN 45* 34*  CREATININE 2.02* 1.59*  CALCIUM 8.7* 8.3*   No results for input(s): AST, ALT, ALKPHOS, BILITOT, PROT, ALBUMIN in the last 72 hours. Recent Labs    03/28/19 0358 03/29/19 0250  WBC 11.7* 11.8*  NEUTROABS 9.2* 9.0*  HGB 10.3* 10.3*  HCT 31.0* 32.0*  MCV 93.4 92.5  PLT 105* 109*   No results for input(s): LABPROT, INR in the last 72 hours.    Assessment/Plan: -Coffee-ground emesis.  Resolved.  No further bleeding episodes -Acute blood loss anemia.  Hemoglobin stable now.   No further bleeding episodes. -Abnormal CT scan showing colitis.  Wall thickening involving distal transverse colon, descending colon and rectosigmoid colon. -A. fib.  Converted back to sinus rhythm.   Recommendations ------------------------ -Discussed with nursing staff.  Patient with poor oral intake and also not taking oral medications.  Recommend speech therapy evaluation. -Advance diet per speech therapy evaluation -Continue  Colestid for diarrhea.  -Diarrhea has improved.  Only one episode today. -C. difficile negative.  GI pathogen negative.  -Continue antibiotics.    -Hold off on EGD for now given lack of further bleeding.  Recommend outpatient EGD and colonoscopy once acute issues are resolved. -Continue PPI.  Monitor H&H.  -GI will follow  Kathi Der MD, FACP 03/29/2019, 12:23 PM  Contact #  [phone]

## 2019-03-29 NOTE — Evaluation (Signed)
Occupational Therapy Evaluation Patient Details Name: Michelle Graves MRN: [id number] DOB: Oct 14, 1951 Today's Date: 03/29/2019    History of Present Illness 68 yo female admitted with acute kidney injury, hypovolemic shock, A fib with RVR, N/V with coffee ground emesis. Hx of CKD, RA, obesity, Sjogren's, chronic pain.    Clinical Impression   Pt was admitted for the above.  At baseline, she lives alone. Pt not very interactive and did not consistently follow commands. She participated in grooming,  and OT assisted her to EOB to try to maintain alertness/maximize participation.  Pt needs total A for most activities, +2 for LB. She was able to maintain static sitting with bil UE support and min guard assist. Will follow in acute setting with the goals listed below    Follow Up Recommendations  SNF    Equipment Recommendations  (defer to next venue)    Recommendations for Other Services       Precautions / Restrictions Precautions Precautions: Fall Restrictions Weight Bearing Restrictions: No      Mobility Bed Mobility Overal bed mobility: Needs Assistance Bed Mobility: Supine to Sit;Sit to Supine     Supine to sit: Total assist Sit to supine: Total assist   General bed mobility comments: Pt less than 25%  Transfers                 General transfer comment: NT    Balance Overall balance assessment: Needs assistance   Sitting balance-[name]: Fair Sitting balance - Comments: bil UEs used for support in sitting                                   ADL either performed or assessed with clinical judgement   ADL Overall ADL's : Needs assistance/impaired Eating/Feeding: Maximal  To total assistance Eating/Feeding Details (indicate cue type and reason): pocketing food per RN Grooming: Maximal assistance, washing face                                 General ADL Comments: total A for all other ADLs, +2 for LB bed level     Vision          Perception     Praxis      Pertinent Vitals/Pain Pain Assessment: Faces Faces Pain Scale: Hurts even more Pain Location: Pt unable to state Pain Descriptors / Indicators: Grimacing;Moaning Pain Intervention(s): Limited activity within patient's tolerance;Monitored during session;Premedicated before session;Repositioned     Hand Dominance (used both)   Extremity/Trunk Assessment Upper Extremity Assessment Upper Extremity Assessment: Difficult to assess due to impaired cognition(moved bil UEs, R more than L)           Communication Communication Communication: (very little verbalization)   Cognition Arousal/Alertness: (periods of eyes opened/closed) Behavior During Therapy: Flat affect Overall Cognitive Status: No family/caregiver present to determine baseline cognitive functioning                                 General Comments: very little verbalization/ doesn't follow commands consistently.  lived alone PTA   General Comments  VSS, BP 171/93    Exercises     Shoulder Instructions      Home Living Family/patient expects to be discharged to:: Unsure Living Arrangements: Alone  Additional Comments: pt unable to provide any info      Prior Functioning/Environment          Comments: pt unable to provide any info        OT Problem List: Decreased strength;Decreased activity tolerance;Impaired balance (sitting and/or standing);Decreased cognition;Decreased knowledge of use of DME or AE;Pain      OT Treatment/Interventions: Self-care/ADL training;Energy conservation;DME and/or AE instruction;Therapeutic activities;Balance training;Patient/family education;Cognitive remediation/compensation    OT Goals(Current goals can be found in the care plan section) Acute Rehab OT Goals Patient Stated Goal: pt unable to state OT Goal Formulation: Patient unable to participate in goal setting Time For Goal  Achievement: 04/12/19 Potential to Achieve Goals: Fair ADL Goals Pt Will Perform Eating: with set-up;with supervision;sitting Pt Will Perform Grooming: with set-up;with supervision;sitting Additional ADL Goal #1: pt will perform UB adls at min A level, sitting Additional ADL Goal #2: pt will perform bed mobility at mod A in preparation for adls Additional ADL Goal #3: pt will perform sit to stand for adls and toilet transfers with mod +2 assistance Additional ADL Goal #4: pt will follow one step commands in context with 90% accuracy  OT Frequency: Min 2X/week   Barriers to D/C:            Co-evaluation              AM-PAC OT "6 Clicks" Daily Activity     Outcome Measure Help from another person eating meals?: A Lot Help from another person taking care of personal grooming?: A Lot Help from another person toileting, which includes using toliet, bedpan, or urinal?: Total Help from another person bathing (including washing, rinsing, drying)?: Total Help from another person to put on and taking off regular upper body clothing?: Total Help from another person to put on and taking off regular lower body clothing?: Total 6 Click Score: 8   End of Session    Activity Tolerance: Patient limited by fatigue Patient left: in bed;with call bell/phone within reach;with bed alarm set  OT Visit Diagnosis: Muscle weakness (generalized) (M62.81)                Time: 1413-1440 OT Time Calculation (min): 27 min Charges:  OT General Charges $OT Visit: 1 Visit OT Evaluation $OT Eval Low Complexity: 1 Low OT Treatments $Self Care/Home Management : 8-22 mins  Marica Otter, OTR/L Acute Rehabilitation Services [phone] [facility] pager [phone] office 03/29/2019  Zlatan Hornback 03/29/2019, 3:25 PM

## 2019-03-30 ENCOUNTER — Inpatient Hospital Stay (HOSPITAL_COMMUNITY): Payer: Medicare Other

## 2019-03-30 ENCOUNTER — Inpatient Hospital Stay: Payer: Self-pay

## 2019-03-30 DIAGNOSIS — I639 Cerebral infarction, unspecified: Secondary | ICD-10-CM

## 2019-03-30 LAB — BASIC METABOLIC PANEL
Anion gap: 10 (ref 5–15)
BUN: 35 mg/dL — ABNORMAL HIGH (ref 8–23)
CO2: 30 mmol/L (ref 22–32)
Calcium: 7.8 mg/dL — ABNORMAL LOW (ref 8.9–10.3)
Chloride: 102 mmol/L (ref 98–111)
Creatinine, Ser: 1.54 mg/dL — ABNORMAL HIGH (ref 0.44–1.00)
GFR calc Af Amer: 40 mL/min — ABNORMAL LOW (ref >60)
GFR calc non Af Amer: 35 mL/min — ABNORMAL LOW (ref >60)
Glucose, Bld: 111 mg/dL — ABNORMAL HIGH (ref 70–99)
Potassium: 3.4 mmol/L — ABNORMAL LOW (ref 3.5–5.1)
Sodium: 142 mmol/L (ref 135–145)

## 2019-03-30 LAB — CULTURE, BLOOD (ROUTINE X 2)
Culture: NO GROWTH
Culture: NO GROWTH
Special Requests: ADEQUATE
Special Requests: ADEQUATE

## 2019-03-30 LAB — CBC
HCT: 30.5 % — ABNORMAL LOW (ref 36.0–46.0)
Hemoglobin: 10 g/dL — ABNORMAL LOW (ref 12.0–15.0)
MCH: 29.7 pg (ref 26.0–34.0)
MCHC: 32.8 g/dL (ref 30.0–36.0)
MCV: 90.5 fL (ref 80.0–100.0)
Platelets: 129 10*3/uL — ABNORMAL LOW (ref 150–400)
RBC: 3.37 MIL/uL — ABNORMAL LOW (ref 3.87–5.11)
RDW: 14.8 % (ref 11.5–15.5)
WBC: 14.4 10*3/uL — ABNORMAL HIGH (ref 4.0–10.5)
nRBC: 0.1 % (ref 0.0–0.2)

## 2019-03-30 MED ORDER — ASPIRIN 325 MG PO TABS: 325.0000 mg | ORAL_TABLET | Freq: Once | ORAL | Status: DC

## 2019-03-30 MED ORDER — POTASSIUM CHLORIDE 10 MEQ/100ML IV SOLN
10.0000 meq | INTRAVENOUS | Status: AC
  Administered 2019-03-30 (×3): 10 meq via INTRAVENOUS
  Filled 2019-03-30 (×3): qty 100

## 2019-03-30 MED ORDER — ATORVASTATIN CALCIUM 80 MG PO TABS
80.0000 mg | ORAL_TABLET | Freq: Every day | ORAL | Status: DC
  Administered 2019-03-31: 80 mg via ORAL
  Filled 2019-03-30: qty 1

## 2019-03-30 NOTE — Consult Note (Addendum)
Neurology Consultation  Reason for Consult: Stroke Referring Physician  adhikari CC: stroke found on MRI  History is obtained from: Chart  HPI: Michelle Graves is a 68 y.o. female with past medical history hypertension diverticulitis chest pain and arthritis.  Patient presented to the emergency department with complains of nausea, black emesis, abdominal pain for 3 days.  In the emergency department she was found to be hypotensive, and had acute kidney injury with a creatinine of 5.  Patient was admitted for the acute kidney injury.  During her stay her blood pressure remained persistently low and needed to be transferred to ICU.  She had a CT scan of her abdomen which showed diffuse colitis.  Then due to confusion and agitation on 03/26/2019 she developed delirium.  For that reason she had an MRI done which showed 2 small acute infarcts.  For that reason neurology was called to evaluate patient.  At current time patient is complaining of abdominal pain but cannot give a good history. She has no recollection of why she is in the hospital but complains of her abdomen being tender. She had a transient period of Afib with RVR that converted to sinus.  LKW: Unknown tpa given?: no, unknown last known normal Premorbid modified Rankin scale (mRS): 0 NIH stroke score of 2  ROS:  Unable to obtain reliably  due to altered mental status.   Past Medical History:  Diagnosis Date  . Arthritis   . Chest pain   . Diverticulitis   . Hypertension     Family History  Problem Relation Age of Onset  . Kidney disease Neg Hx    Social History:   reports that she has never smoked. She has never used smokeless tobacco. She reports current alcohol use of about 21.0 standard drinks of alcohol per week. She reports that she does not use drugs.  Medications  Current Facility-Administered Medications:  .  0.9 %  sodium chloride infusion, , Intravenous, Continuous, Coralyn Helling, MD, Stopped at 03/28/19  [phone] .  acetaminophen (TYLENOL) tablet 650 mg, 650 mg, Oral, Q6H PRN **OR** acetaminophen (TYLENOL) suppository 650 mg, 650 mg, Rectal, Q6H PRN, Narda Bonds, MD .  amLODipine (NORVASC) tablet 10 mg, 10 mg, Oral, Daily, Ollis, Brandi L, NP, 10 mg at 03/30/19 0929 .  Chlorhexidine Gluconate Cloth 2 % PADS 6 each, 6 each, Topical, Daily, Narda Bonds, MD, 6 each at 03/29/19 [phone] .  cloNIDine (CATAPRES) tablet 0.1 mg, 0.1 mg, Oral, BID, Dorcas Carrow, MD, 0.1 mg at 03/30/19 1217 .  colestipol (COLESTID) tablet 1 g, 1 g, Oral, BID, Brahmbhatt, Parag, MD, 1 g at 03/30/19 1216 .  furosemide (LASIX) tablet 40 mg, 40 mg, Oral, Daily, Ghimire, Kuber, MD, 40 mg at 03/30/19 0930 .  HYDROcodone-acetaminophen (NORCO) 10-325 MG per tablet 1 tablet, 1 tablet, Oral, Q6H PRN, Narda Bonds, MD, 1 tablet at 03/29/19 0931 .  hydroxychloroquine (PLAQUENIL) tablet 200 mg, 200 mg, Oral, BID, Narda Bonds, MD, 200 mg at 03/30/19 0931 .  labetalol (NORMODYNE) injection 10 mg, 10 mg, Intravenous, Q2H PRN, Dorcas Carrow, MD, 10 mg at 03/29/19 1130 .  MEDLINE mouth rinse, 15 mL, Mouth Rinse, BID, Narda Bonds, MD, 15 mL at 03/30/19 0900 .  metoprolol tartrate (LOPRESSOR) tablet 100 mg, 100 mg, Oral, BID, Dorcas Carrow, MD, 100 mg at 03/30/19 1219 .  morphine 2 MG/ML injection 2-4 mg, 2-4 mg, Intravenous, Q3H PRN, Deterding, Dorise Hiss, MD, 4 mg at 03/30/19 1055 .  ondansetron (ZOFRAN) tablet 4 mg, 4 mg, Oral, Q6H PRN **OR** ondansetron (ZOFRAN) injection 4 mg, 4 mg, Intravenous, Q6H PRN, Narda Bonds, MD, 4 mg at 03/29/19 1547 .  pantoprazole (PROTONIX) EC tablet 40 mg, 40 mg, Oral, BID, Herby Abraham, RPH, 40 mg at 03/30/19 0958 .  [COMPLETED] piperacillin-tazobactam (ZOSYN) IVPB 3.375 g, 3.375 g, Intravenous, Once, Stopped at 03/26/19 1101 **FOLLOWED BY** piperacillin-tazobactam (ZOSYN) IVPB 3.375 g, 3.375 g, Intravenous, Q8H, Wofford, Drew A, RPH, Last Rate: 12.5 mL/hr at 03/30/19 0946, 3.375 g at  03/30/19 0946 .  pneumococcal 23 valent vaccine (PNU-IMMUNE) injection 0.5 mL, 0.5 mL, Intramuscular, Tomorrow-1000, Dorcas Carrow, MD   Exam: Current vital signs: BP 102/84   Pulse 89   Temp 98.2 F (36.8 C) (Axillary)   Resp 14   Ht 5\' 7"  (1.702 m)   Wt 92.9 kg   SpO2 95%   BMI 32.08 kg/m  Vital signs in last 24 hours: Temp:  [97.9 F (36.6 C)-99.1 F (37.3 C)] 98.2 F (36.8 C) (06/03 0700) Pulse Rate:  [89-107] 89 (06/03 1400) Resp:  [10-34] 14 (06/03 1400) BP: (102-166)/(70-142) 102/84 (06/03 1400) SpO2:  [80 %-99 %] 95 % (06/03 1400) Weight:  [92.9 kg] 92.9 kg (06/03 0500)  Physical Exam  Constitutional: Appears well-developed and well-nourished.  Psych: Confused Eyes: No scleral injection HENT: No OP obstrucion Head: Normocephalic.  Cardiovascular: Normal rate and regular rhythm.  Respiratory: Effort normal, non-labored breathing GI: Soft.  No distension. +abdominal tenderness.  Skin: WDI  Neuro: Mental Status: Patient is awake, alert, however she is not oriented to hospital, place, month or year Patient is unable to give a good history No signs of aphasia or following commands Cranial Nerves: II: Visual Fields are full.  III,IV, VI: EOMI without ptosis or diploplia. Pupils equal, round and reactive to light V: Facial sensation is symmetric to temperature VII: Facial movement is symmetric.  VIII: hearing is intact to voice X: Palat elevates symmetrically XI: Shoulder shrug is symmetric. XII: tongue is midline without atrophy or fasciculations.  Motor: Tone is normal. Bulk is normal.  4/5 throughout  With no drift in UE and some symmetric drift in the lower ext b/l Sensory: Sensation is symmetric to light touch and temperature in the arms and legs. Deep Tendon Reflexes: 1+ in the upper extremities with no knee jerk Plantars: Mute bilaterally Cerebellar: FNF intact bilaterally  Labs I have reviewed labs in epic and the results pertinent to this  consultation are:   CBC    Component Value Date/Time   WBC 14.4 (H) 03/30/2019 0418   RBC 3.37 (L) 03/30/2019 0418   HGB 10.0 (L) 03/30/2019 0418   HCT 30.5 (L) 03/30/2019 0418   PLT 129 (L) 03/30/2019 0418   MCV 90.5 03/30/2019 0418   MCH 29.7 03/30/2019 0418   MCHC 32.8 03/30/2019 0418   RDW 14.8 03/30/2019 0418   LYMPHSABS 1.3 03/29/2019 0250   MONOABS 1.2 (H) 03/29/2019 0250   EOSABS 0.1 03/29/2019 0250   BASOSABS 0.1 03/29/2019 0250    CMP     Component Value Date/Time   NA 142 03/30/2019 0418   K 3.4 (L) 03/30/2019 0418   CL 102 03/30/2019 0418   CO2 30 03/30/2019 0418   GLUCOSE 111 (H) 03/30/2019 0418   BUN 35 (H) 03/30/2019 0418   CREATININE 1.54 (H) 03/30/2019 0418   CALCIUM 7.8 (L) 03/30/2019 0418   PROT 5.4 (L) 03/25/2019 1616   ALBUMIN 2.4 (L) 03/25/2019 1616  AST 29 03/25/2019 1616   ALT 16 03/25/2019 1616   ALKPHOS 64 03/25/2019 1616   BILITOT 0.9 03/25/2019 1616   GFRNONAA 35 (L) 03/30/2019 0418   GFRAA 40 (L) 03/30/2019 0418    Imaging I have reviewed the images obtained:   MRI examination of the brain-positive for 2 small acute on chronic cerebral white matter lacunar infarcts, both in the right hemisphere.  No associated hemorrhage or mass-effect  Echocardiogram: Showed a ejection fraction of 55-60% with cavity size wall normal.  Felicie Mornavid Smith PA-C [facility] [phone]  M-F  (9:00 am- 5:00 PM)  03/30/2019, 3:15 PM   Attending Neurohospitalist Addendum Patient seen and examined with APP/Resident. Agree with the history and physical as documented above. [facility] upon discussing with Dr. Laurence SlateAroor, who signed out the patient to me at the end of his shift. He had made some edits to [employer] Smith's note. I have made my edits, and A&P below.  Assessment:  68 year old female who was brought into the hospital secondary to GI bleed and hypotension.  While in the hospital patient  developed delirium and obtained a MRI brain.  MRI brain showed what likely is a incidental 2 small acute/subacute cerebral white matter lacunar infarcts - could be watershed pattern.   Likely these infarcts are secondary to the low blood pressure versus transient A. fib.  Impression: -Acute ischemic stroke - secondary to new afib (cardioembolic) v watershed from hypotension -New Afib with RVR diagnosed this admission, now back in SR -Coffee-ground emesis which is resolved -Acute blood loss anemia.  Hemoglobin stable at this point in time   Recommend #MRA Head and neck  #Start or continue Atorvastatin 80 mg/other high intensity statin #ASA for now when OK with GI. Will need anticoagulation given Afib with RVR - new diagnosis. # BP goal: permissive HTN upto 220/120 mmHg # HBAIC and Lipid profile # Telemetry monitoring # Frequent neuro checks # NPO until passes stroke swallow screen #[facility] by [facility] # please page stroke NP  Or  PA  Or MD from 8am -4 pm  as this patient from this time will be  followed by the stroke.   You can look them up on [email or web address]  Password [email or web address]   I have independently reviewed the chart, obtained history, review of systems and examined the patient.I have personally reviewed pertinent head/neck/spine imaging (CT/MRI). Please feel free to call with any questions. --- Milon DikesAshish Yanelie Abraha, MD [facility] Pager: [phone]  If 7pm to 7am, please call on call as listed on AMION.

## 2019-03-30 NOTE — Progress Notes (Signed)
Spoke to primary RN concerning PICC placement,pt is followed by Renal MD and needs clearance for PICC insertion.Pt have L IJ CVC and working well. Will wait for follow up orders.

## 2019-03-30 NOTE — Evaluation (Signed)
SLP Cancellation Note  Patient Details Name: Michelle Graves MRN: [id number] DOB: 25-Jun-1951   Cancelled treatment:       Reason Eval/Treat Not Completed: Other (comment)(pt for MRI at 1100 per RN, will continue efforts)   Chales Abrahams 03/30/2019, 11:09 AM   Donavan Burnet, MS [facility] SLP Acute Rehab Services Pager [phone] Office [phone]

## 2019-03-30 NOTE — Progress Notes (Signed)
[facility] Progress Note  Michelle Graves 68 y.o. 1951-05-11  CC: GI bleed/colitis   Subjective: She continues to have intermittent confusion.  Discussed with nursing staff.  2 episodes of diarrhea this morning.  Awaiting MRI brain report.  ROS : Not able to obtain   Objective: Vital signs in last 24 hours: Vitals:   03/30/19 0926 03/30/19 1210  BP: (!) 127/97 (!) 130/96  Pulse:  (!) 107  Resp: 18 (!) 29  Temp:    SpO2:  99%    Physical Exam:  General:   Resting comfortably in the bed.  Head:  Normocephalic, without obvious abnormality, atraumatic   Limited examination of oropharynx was normal.  Lungs:    Anterior exam only.  Occasional rhonchi.  Heart:  Regular rate and rhythm,   Abdomen:    Mild distended, left upper quadrant tenderness to palpation, bowel sounds present.  No peritoneal signs  Extremities: Extremities normal, atraumatic, LE edema noted.        Lab Results: Recent Labs    03/29/19 0250 03/30/19 0418  NA 141 142  K 3.6 3.4*  CL 101 102  CO2 26 30  GLUCOSE 86 111*  BUN 34* 35*  CREATININE 1.59* 1.54*  CALCIUM 8.3* 7.8*   No results for input(s): AST, ALT, ALKPHOS, BILITOT, PROT, ALBUMIN in the last 72 hours. Recent Labs    03/28/19 0358 03/29/19 0250 03/30/19 0418  WBC 11.7* 11.8* 14.4*  NEUTROABS 9.2* 9.0*  --   HGB 10.3* 10.3* 10.0*  HCT 31.0* 32.0* 30.5*  MCV 93.4 92.5 90.5  PLT 105* 109* 129*   No results for input(s): LABPROT, INR in the last 72 hours.    Assessment/Plan: -Coffee-ground emesis.  Resolved.  No further bleeding episodes -Acute blood loss anemia.  Hemoglobin stable now.   No further bleeding episodes. -Abnormal CT scan showing colitis.  Wall thickening involving distal transverse colon, descending colon and rectosigmoid colon. -A. fib.  Converted back to sinus rhythm.   Recommendations ------------------------ -Patient is still not taking her oral meds.  Awaiting speech evaluation.  CT head  negative.  MRI brain completed.  Report pending.  -Consider neurology consult.  -Discussed with daughter over the phone.  Given ongoing diarrhea and left upper quadrant abdominal pain, we may consider EGD and colonoscopy tentatively on Friday (depending on other clinical parameters)  -C. difficile negative.  GI pathogen negative.  -Continue antibiotics.   -Continue PPI.  Monitor H&H. -GI will follow  Kathi Der MD, FACP 03/30/2019, 12:27 PM  Contact #  [phone]

## 2019-03-30 NOTE — Progress Notes (Signed)
PROGRESS NOTE    Michelle Graves  ZOX:[id number] DOB: 03/28/1951 DOA: 03/24/2019 [facility]: [facility]   Brief Narrative: Patient is a 68 year old female with history of hypertension, CKD stage III, rheumatoid arthritis who presents the emergency department with complaints of nausea, black emesis, abdominal pain for 3 days.  In the emergency department she was found to be hypotensive, had acute kidney injury with creatinine of 5.  She was admitted for the management of acute kidney injury.  Her blood pressure remained persistently low and she had to be transferred to ICU where she underwent central line/arterial placement and was also started on pressors.  PCCM was following.  She was fluid resuscitated with improvement in her blood pressure.  CT scan of the abdomen showed diffuse colitis.  GI consulted.  Patient became confused and agitated on 03/26/2019.  Developed delirium.  MRI done today showed 2 small acute on chronic lacunar infarcts.  Neurology consulted.  [facility] will be  [facility].  Assessment & Plan:   Principal Problem:   Acute kidney injury superimposed on CKD (HCC) Active Problems:   HYPERTENSION, MALIGNANT ESSENTIAL   KIDNEY DISEASE, CHRONIC, STAGE III   Hypotension   Dark emesis   Dark stools   New onset a-fib (HCC)   Ischemic stroke: MRI showed 2  small acute on chronic cerebral white matter lacunar infarcts, both in the right hemisphere.  Neurology consulted. She had become confused, agitated and delirious since last 2 to 3 days. PT/OT consultation.  Echocardiogram has been recently done on 03/27/19  which showed normal left ventricular function, impaired left ventricular relaxation. Speech will consulted. Will keep her NPO until further evaluation.  Further recommendations as per neurology.  Neurology recommended to hold on starting on antiplatelets for now until seen physically by them.She was on aspirin at  home.  Nausea/vomiting/coffee-ground emesis: GI following.  Currently blood hemoglobin is stable.  No gross bleeding.  Suspected PUD.  She was on NSAIDs and prednisone at home.  Continue Protonix. CT imaging showed transverse and descending colon colitis.  GI planning for EGD and colonoscopy.  C. difficile negative, GI pathogen panel negative.  No history of recent antibiotic use.  Continue Zosyn for now.Continue colestipol.  Acute kidney injury: Baseline creatinine ranged from 1.2-1.6.  Currently kidney function is at baseline.  Presented with severe acute kidney injury thought to be secondary to dehydration, ongoing NSAIDs and ACE inhibitor's use.  Ultrasound showed chronic renal disease, normal hydronephrosis or obstruction.  Hypotension/suspected hypovolemic shock: Had to be initially transferred to ICU for pressors.  Fluid resuscitated.  Currently blood pressure stable and home meds resumed.  New onset A. fib with RVR: Currently in normal sinus rhythm.  Was treated with Cardizem drip.  Currently on oral metoprolol.  Blood pressure stable.  Minimal troponin elevation secondary to supply demand ischemia.  Echocardiogram showed normal left ventricular function.  No new wall motion abnormalities.  Rapid A. fib in the setting of sepsis so anticoagulation not started.   Acute hypoxic respiratory failure:   Most likely secondary to fluid overload after massive fluid resuscitation.  Continue supplemental oxygen as needed.  Started on Lasix.  Earlier imaging showed pleural effusion but  pulmonology did not recommend thoracentesis and it improved with diuresis.  Chest x-ray today showed mild bibasilar atelectasis and small left effusion.  Respiratory status stable now.  Hypertension: Currently blood pressure stable.  Continue amlodipine, clonidine  Rheumatoid arthritis: On Plaquenil at home.  Follows with  rheumatologist.  Recently completed a course of steroids.  Avoid NSAIDs.  Also takes Enbrel every  week  Hypokalemia: Supplemented with potassium.  Thrombocytopenia: Her platelets are in the range of 200s chronically.  Mild thrombocytopenia currently  most likely secondary to ongoing hemodynamic instability, colitis and other metabolic derangements.Continue to monitor          DVT prophylaxis:SCD Code Status: DNR Family Communication: Daughter on phone Disposition Plan: Home vs SNF after full workup.   Consultants: GI,neurology,PCCM    Antimicrobials:  Anti-infectives (From admission, onward)   Start     Dose/Rate Route Frequency Ordered Stop   03/26/19 1600  piperacillin-tazobactam (ZOSYN) IVPB 3.375 g     3.375 g 12.5 mL/hr over 240 Minutes Intravenous Every 8 hours 03/26/19 1005     03/26/19 1015  piperacillin-tazobactam (ZOSYN) IVPB 3.375 g     3.375 g 100 mL/hr over 30 Minutes Intravenous  Once 03/26/19 1005 03/26/19 1101   03/26/19 1000  ceFEPIme (MAXIPIME) 2 g in sodium chloride 0.9 % 100 mL IVPB  Status:  Discontinued     2 g 200 mL/hr over 30 Minutes Intravenous Daily 03/26/19 0905 03/26/19 1005   03/24/19 2200  hydroxychloroquine (PLAQUENIL) tablet 200 mg     200 mg Oral 2 times daily 03/24/19 2106        Subjective: Patient seen and examined at bedside this morning.  Hemodynamically stable.  Mental status might have improved slightly from yesterday.  She is oriented to place and person but not oriented to date and time.  Looks very weak and debilitated.  Obeys commands.  Objective: Vitals:   03/30/19 0700 03/30/19 0800 03/30/19 0926 03/30/19 1210  BP: 133/70 (!) 133/99 (!) 127/97 (!) 130/96  Pulse:    (!) 107  Resp: (!) 23 (!) 24 18 (!) 29  Temp: 98.2 F (36.8 C)     TempSrc: Axillary     SpO2:    99%  Weight:      Height:        Intake/Output Summary (Last 24 hours) at 03/30/2019 1300 Last data filed at 03/30/2019 1100 Gross per 24 hour  Intake 1169.94 ml  Output 1675 ml  Net -505.06 ml   Filed Weights   03/28/19 0400 03/29/19 0353 03/30/19  0500  Weight: 99.7 kg 94.1 kg 92.9 kg    Examination:  General exam: weak, debilitated HEENT:PERRL,Oral mucosa dry, Ear/Nose normal on gross exam, Central line on the left neck Respiratory system: Bilateral equal air entry, normal vesicular breath sounds, no wheezes or crackles  Cardiovascular system: S1 & S2 heard, RRR. No JVD, murmurs, rubs, gallops or clicks. No pedal edema. Gastrointestinal system: Abdomen is mildly distended, soft, tenderness on the left lower quadrant, soft and nontender. No organomegaly or masses felt. Normal bowel sounds heard. Central nervous system: Alert and oriented to place and person. No focal neurological deficits. Extremities: No edema, no clubbing ,no cyanosis, distal peripheral pulses palpable. Skin: No rashes, lesions or ulcers,no icterus ,no pallor  Data Reviewed: I have personally reviewed following labs and imaging studies  CBC: Recent Labs  Lab 03/24/19 1309  03/25/19 1616 03/26/19 0405 03/27/19 0401 03/28/19 0358 03/29/19 0250 03/30/19 0418  WBC 11.6*   < > 10.7* 12.6* 13.3* 11.7* 11.8* 14.4*  NEUTROABS 10.2*  --  9.1*  --  11.4* 9.2* 9.0*  --   HGB 14.6   < > 10.4* 9.7* 9.4* 10.3* 10.3* 10.0*  HCT 46.2*   < > 32.5* 30.5* 29.5* 31.0* 32.0*  30.5*  MCV 98.1   < > 96.2 96.8 95.5 93.4 92.5 90.5  PLT 200   < > 137* 130* 110* 105* 109* 129*   < > = values in this interval not displayed.   Basic Metabolic Panel: Recent Labs  Lab 03/26/19 0405 03/27/19 0401 03/28/19 0358 03/29/19 0250 03/30/19 0418  NA 140 142 142 141 142  K 4.2 3.4* 4.1 3.6 3.4*  CL 110 108 103 101 102  CO2 20* 24 29 26 30   GLUCOSE 93 72 92 86 111*  BUN 70* 52* 45* 34* 35*  CREATININE 2.85* 2.16* 2.02* 1.59* 1.54*  CALCIUM 6.5* 7.3* 8.7* 8.3* 7.8*  MG 2.0  --   --   --   --   PHOS 5.0*  --   --   --   --    GFR: Estimated Creatinine Clearance: 42 mL/min (A) (by C-G formula based on SCr of 1.54 mg/dL (H)). Liver Function Tests: Recent Labs  Lab 03/24/19 1309  03/25/19 1616  AST 35 29  ALT 20 16  ALKPHOS 93 64  BILITOT 1.6* 0.9  PROT 8.8* 5.4*  ALBUMIN 4.4 2.4*   No results for input(s): LIPASE, AMYLASE in the last 168 hours. Recent Labs  Lab 03/25/19 1747 03/29/19 1446  AMMONIA 27 16   Coagulation Profile: Recent Labs  Lab 03/24/19 1309  INR 1.0   Cardiac Enzymes: Recent Labs  Lab 03/26/19 0612 03/26/19 1238 03/27/19 0406  TROPONINI 0.03* <0.03 0.32*   BNP (last 3 results) No results for input(s): PROBNP in the last 8760 hours. HbA1C: No results for input(s): HGBA1C in the last 72 hours. CBG: Recent Labs  Lab 03/24/19 1248  GLUCAP 158*   Lipid Profile: No results for input(s): CHOL, HDL, LDLCALC, TRIG, CHOLHDL, LDLDIRECT in the last 72 hours. Thyroid Function Tests: No results for input(s): TSH, T4TOTAL, FREET4, T3FREE, THYROIDAB in the last 72 hours. Anemia Panel: No results for input(s): VITAMINB12, FOLATE, FERRITIN, TIBC, IRON, RETICCTPCT in the last 72 hours. Sepsis Labs: Recent Labs  Lab 03/24/19 1821 03/24/19 2255 03/25/19 1616  LATICACIDVEN 6.6* 5.8* 1.8    Recent Results (from the past 240 hour(s))  SARS Coronavirus 2 (CEPHEID - Performed in [facility] hospital lab), Hosp Order     Status: None   Collection Time: 03/24/19  1:13 PM  Result Value Ref Range Status   SARS Coronavirus 2 NEGATIVE NEGATIVE Final    Comment: (NOTE) If result is NEGATIVE SARS-CoV-2 target nucleic acids are NOT DETECTED. The SARS-CoV-2 RNA is generally detectable in upper and lower  respiratory specimens during the acute phase of infection. The lowest  concentration of SARS-CoV-2 viral copies this assay can detect is 250  copies / mL. A negative result does not preclude SARS-CoV-2 infection  and should not be used as the sole basis for treatment or other  patient management decisions.  A negative result may occur with  improper specimen collection / handling, submission of specimen other  than nasopharyngeal swab,  presence of viral mutation(s) within the  areas targeted by this assay, and inadequate number of viral copies  (<250 copies / mL). A negative result must be combined with clinical  observations, patient history, and epidemiological information. If result is POSITIVE SARS-CoV-2 target nucleic acids are DETECTED. The SARS-CoV-2 RNA is generally detectable in upper and lower  respiratory specimens dur ing the acute phase of infection.  Positive  results are indicative of active infection with SARS-CoV-2.  Clinical  correlation with patient  history and other diagnostic information is  necessary to determine patient infection status.  Positive results do  not rule out bacterial infection or co-infection with other viruses. If result is PRESUMPTIVE POSTIVE SARS-CoV-2 nucleic acids MAY BE PRESENT.   A presumptive positive result was obtained on the submitted specimen  and confirmed on repeat testing.  While 2019 novel coronavirus  (SARS-CoV-2) nucleic acids may be present in the submitted sample  additional confirmatory testing may be necessary for epidemiological  and / or clinical management purposes  to differentiate between  SARS-CoV-2 and other Sarbecovirus currently known to infect humans.  If clinically indicated additional testing with an alternate test  methodology [phone]) is advised. The SARS-CoV-2 RNA is generally  detectable in upper and lower respiratory sp ecimens during the acute  phase of infection. The expected result is Negative. Fact Sheet for Patients:  BoilerBrush.com.cy Fact Sheet for Healthcare Providers: https://pope.com/ This test is not yet approved or cleared by the [place] FDA and has been authorized for detection and/or diagnosis of SARS-CoV-2 by FDA under an Emergency Use Authorization (EUA).  This EUA will remain in effect (meaning this test can be used) for the duration of the COVID-19 declaration under  Section 564(b)(1) of the Act, 21 U.S.C. section 360bbb-3(b)(1), unless the authorization is terminated or revoked sooner. Performed at [facility], [address], [place], [place] [address]   MRSA PCR Screening     Status: None   Collection Time: 03/24/19  8:50 PM  Result Value Ref Range Status   MRSA by PCR NEGATIVE NEGATIVE Final    Comment:        The GeneXpert MRSA Assay (FDA approved for NASAL specimens only), is one component of a comprehensive MRSA colonization surveillance program. It is not intended to diagnose MRSA infection nor to guide or monitor treatment for MRSA infections. Performed at [facility], [address] [place], [place] [address]   Culture, blood (routine x 2)     Status: None   Collection Time: 03/25/19  6:40 PM  Result Value Ref Range Status   Specimen Description   Final    BLOOD CENTRAL LINE Performed at [facility], [address] [place], [place] [address]    Special Requests   Final    BOTTLES DRAWN AEROBIC AND ANAEROBIC Blood Culture adequate volume Performed at [facility], [address], [place], [place] [address]    Culture   Final    NO GROWTH 5 DAYS Performed at [facility], [address], [place], [place] [address]    Report Status 03/30/2019 FINAL  Final  Culture, blood (routine x 2)     Status: None   Collection Time: 03/25/19  6:54 PM  Result Value Ref Range Status   Specimen Description   Final    BLOOD ARTERIAL Performed at [facility], [address] [place], [place] [address]    Special Requests   Final    BOTTLES DRAWN AEROBIC AND ANAEROBIC Blood Culture adequate volume Performed at [facility], [address], [place], [place] [address]    Culture   Final    NO GROWTH 5 DAYS Performed at [facility], [address] [place], [place] [address]    Report Status 03/30/2019 FINAL   Final  C difficile quick scan w PCR reflex     Status: None   Collection Time: 03/26/19  8:21 AM  Result  Value Ref Range Status   C Diff antigen NEGATIVE NEGATIVE Final   C Diff toxin NEGATIVE NEGATIVE Final   C Diff interpretation No C. difficile detected.  Final    Comment: Performed at [facility], [address], [place], [place] [address]  Gastrointestinal Panel by PCR , Stool     Status: None   Collection Time: 03/26/19 12:00 PM  Result Value Ref Range Status   Campylobacter species NOT DETECTED NOT DETECTED Final   Plesimonas shigelloides NOT DETECTED NOT DETECTED Final   Salmonella species NOT DETECTED NOT DETECTED Final   Yersinia enterocolitica NOT DETECTED NOT DETECTED Final   Vibrio species NOT DETECTED NOT DETECTED Final   Vibrio cholerae NOT DETECTED NOT DETECTED Final   Enteroaggregative E coli (EAEC) NOT DETECTED NOT DETECTED Final   Enteropathogenic E coli (EPEC) NOT DETECTED NOT DETECTED Final   Enterotoxigenic E coli (ETEC) NOT DETECTED NOT DETECTED Final   Shiga like toxin producing E coli (STEC) NOT DETECTED NOT DETECTED Final   Shigella/Enteroinvasive E coli (EIEC) NOT DETECTED NOT DETECTED Final   Cryptosporidium NOT DETECTED NOT DETECTED Final   Cyclospora cayetanensis NOT DETECTED NOT DETECTED Final   Entamoeba histolytica NOT DETECTED NOT DETECTED Final   Giardia lamblia NOT DETECTED NOT DETECTED Final   Adenovirus F40/41 NOT DETECTED NOT DETECTED Final   Astrovirus NOT DETECTED NOT DETECTED Final   Norovirus GI/GII NOT DETECTED NOT DETECTED Final   Rotavirus A NOT DETECTED NOT DETECTED Final   Sapovirus (I, II, IV, and V) NOT DETECTED NOT DETECTED Final    Comment: Performed at [facility], [address], [place], [place] [address]         Radiology Studies: Ct Head Wo Contrast  Result Date: 03/29/2019 CLINICAL DATA:  Hypotensive. EXAM: CT HEAD WITHOUT CONTRAST TECHNIQUE: Contiguous axial images were obtained from the  base of the skull through the vertex without intravenous contrast. COMPARISON:  Head CT 01/25/2007 FINDINGS: Brain: Age advanced cerebral atrophy, ventriculomegaly and significant periventricular white matter disease, likely microvascular ischemic change. Findings progressive since the prior CT scan. Ventricles are in the midline and demonstrate normal configuration. No extra-axial fluid collections are identified. No CT findings for acute intracranial process such as hemispheric infarction or intracranial hemorrhage. No mass lesions are identified. The brainstem and cerebellum are grossly normal. Vascular: Moderate vascular calcifications but no definite aneurysm or hyperdense vessels. Skull: No skull fracture or worrisome bone lesions. Sinuses/Orbits: The paranasal sinuses and mastoid air cells are grossly clear. The globes are intact. Other: No scalp lesions or hematoma. IMPRESSION: 1. Age advanced cerebral atrophy, ventriculomegaly and significant periventricular white matter disease. 2. No acute intracranial findings or mass lesion. Electronically Signed   By: Rudie Meyer M.D.   On: 03/29/2019 13:27   Mr Brain Wo Contrast  Result Date: 03/30/2019 CLINICAL DATA:  68 year old female with persistent encephalopathy. EXAM: MRI HEAD WITHOUT CONTRAST TECHNIQUE: Multiplanar, multiecho pulse sequences of the brain and surrounding structures were obtained without intravenous contrast. COMPARISON:  Head CT 03/29/2019. FINDINGS: Brain: Intermittent motion artifact. There is a small right posterior centrum semi of bowel focus of heterogeneous diffusion (series 3, image 36) which does appear restricted on series 300, image 36. There is a nearby chronic lacunar infarct (series 8, image 57) and generalized underlying bilateral cerebral white matter T2 and FLAIR hyperintensity. There is a 2nd punctate area of restricted diffusion in the right periatrial white matter best seen on series 9 image 10 and series 900, image  10.  No associated hemorrhage or mass effect. No other restricted diffusion. Chronic lacunar infarct of the right corona radiata and basal ganglia. Chronic lacunar infarcts in both thalami with microhemorrhage. There is a small area of chronic microhemorrhage in the right cerebellum on series 7, image 9. Probable chronic microhemorrhage in the right pons on image 13. No superimposed cortical encephalomalacia identified. No midline shift, mass effect, evidence of mass lesion, ventriculomegaly, extra-axial collection or acute intracranial hemorrhage. Cervicomedullary junction and pituitary are within normal limits. Vascular: Major intracranial vascular flow voids are preserved. Generalized intracranial artery tortuosity. Skull and upper cervical spine: Negative visible cervical spine. Visualized bone marrow signal is within normal limits. Sinuses/Orbits: Grossly negative orbits. Paranasal Visualized paranasal sinuses and mastoids are stable and well pneumatized. Other: Scalp and face soft tissues appear negative. IMPRESSION: 1. Positive for two small acute on chronic cerebral white matter lacunar infarcts, both in the right hemisphere. No associated hemorrhage or mass effect. 2. Underlying severe chronic small vessel disease, including several chronic micro-hemorrhages. Electronically Signed   By: Odessa Fleming M.D.   On: 03/30/2019 12:45   Dg Chest Port 1 View  Result Date: 03/30/2019 CLINICAL DATA:  Follow-up pleural effusion EXAM: PORTABLE CHEST 1 VIEW COMPARISON:  03/29/2019 FINDINGS: Cardiac shadow is within normal limits. Left jugular central line is again seen and stable. The overall inspiratory effort is poor. Mild bibasilar atelectasis is noted. There are changes suggestive of small left pleural effusion posteriorly. IMPRESSION: Poor inspiratory effort with mild bibasilar atelectasis and small left effusion. Electronically Signed   By: Alcide Clever M.D.   On: 03/30/2019 07:19   Dg Chest Port 1 View  Result Date:  03/29/2019 CLINICAL DATA:  Follow-up pleural effusion EXAM: PORTABLE CHEST 1 VIEW COMPARISON:  03/28/2019 FINDINGS: Cardiac shadow remains enlarged. Left jugular central line is again seen and stable. Mild increased density is noted over the left chest consistent with small pleural effusion. The overall inspiratory effort is poor. No bony abnormality is noted. IMPRESSION: Persistent left posterior effusion. Electronically Signed   By: Alcide Clever M.D.   On: 03/29/2019 07:58        Scheduled Meds: . amLODipine  10 mg Oral Daily  . Chlorhexidine Gluconate Cloth  6 each Topical Daily  . cloNIDine  0.1 mg Oral BID  . colestipol  1 g Oral BID  . furosemide  40 mg Oral Daily  . gabapentin  100 mg Oral TID  . hydroxychloroquine  200 mg Oral BID  . mouth rinse  15 mL Mouth Rinse BID  . metoprolol tartrate  100 mg Oral BID  . pantoprazole  40 mg Oral BID  . pneumococcal 23 valent vaccine  0.5 mL Intramuscular Tomorrow-1000   Continuous Infusions: . sodium chloride Stopped (03/28/19 0816)  . piperacillin-tazobactam (ZOSYN)  IV 3.375 g (03/30/19 0946)     LOS: 6 days    Time spent: 35 mins.More than 50% of that time was spent in counseling and/or coordination of care.      Burnadette Pop, MD [facility] Pager [phone]  If 7PM-7AM, please contact night-coverage [email or web address] Password [email or web address] 03/30/2019, 1:00 PM

## 2019-03-30 NOTE — Evaluation (Signed)
SLP Cancellation Note  Patient Details Name: Michelle Graves MRN: [id number] DOB: 1951/01/17   Cancelled treatment:       Reason Eval/Treat Not Completed: Other (comment)(pt had left approximately 10 minutes ago for [facility])  Will continue efforts at [facility].    Donavan Burnet, MS [facility] SLP Acute Rehab Services Pager [phone] Office [phone]    Chales Abrahams 03/30/2019, 6:27 PM

## 2019-03-31 ENCOUNTER — Inpatient Hospital Stay (HOSPITAL_COMMUNITY): Payer: Medicare Other

## 2019-03-31 ENCOUNTER — Encounter (HOSPITAL_COMMUNITY): Payer: Self-pay | Admitting: Cardiology

## 2019-03-31 DIAGNOSIS — I471 Supraventricular tachycardia: Secondary | ICD-10-CM

## 2019-03-31 DIAGNOSIS — I6330 Cerebral infarction due to thrombosis of unspecified cerebral artery: Secondary | ICD-10-CM

## 2019-03-31 DIAGNOSIS — I4891 Unspecified atrial fibrillation: Secondary | ICD-10-CM

## 2019-03-31 MED ORDER — SODIUM CHLORIDE 0.9% FLUSH: 10.0000 mL | INTRAVENOUS | Status: DC | PRN

## 2019-03-31 MED ORDER — SODIUM CHLORIDE 0.9% FLUSH
10.0000 mL | Freq: Two times a day (BID) | INTRAVENOUS | Status: DC
  Administered 2019-03-31 – 2019-04-03 (×7): 10 mL

## 2019-03-31 MED ORDER — WHITE PETROLATUM EX OINT
TOPICAL_OINTMENT | CUTANEOUS | Status: AC
  Administered 2019-03-31: 12:00:00
  Filled 2019-03-31: qty 28.35

## 2019-03-31 MED ORDER — SODIUM CHLORIDE 0.9% FLUSH
10.0000 mL | Freq: Two times a day (BID) | INTRAVENOUS | Status: DC
  Administered 2019-03-31 – 2019-04-04 (×8): 10 mL

## 2019-03-31 MED ORDER — PEG 3350-KCL-NA BICARB-NACL 420 G PO SOLR
4000.0000 mL | Freq: Once | ORAL | Status: AC
  Administered 2019-03-31: 4000 mL via ORAL
  Filled 2019-03-31: qty 4000

## 2019-03-31 NOTE — Progress Notes (Signed)
Patient ID: Michelle Graves, female   DOB: 09/03/1951, 68 y.o.   MRN: [id number]  PROGRESS NOTE    Michelle Graves  WUJ:[id number] DOB: 11/18/1950 DOA: 03/24/2019 [facility]   Brief Narrative:  68 year old female with history of hypertension, CKD stage III, rheumatoid arthritis presented on 03/24/2019 with nausea, black emesis and abdominal pain for 3 days prior to presentation.  She was found to be hypotensive with acute kidney injury with creatinine of 5.  She had to be subsequently transferred to ICU for persistent hypotension and required pressors.  PCCM also was consulted.  Her blood pressure improved with fluid resuscitation and pressors.  CT scan of the abdomen showed diffuse colitis.  She was started on broad-spectrum antibiotics.  GI was consulted.  She became confused and agitated on 03/26/2019.  MRI of the brain on 03/30/2019 showed 2 small acute on chronic lacunar infarcts.  Ne[facility].  Assessment & Plan:   Ischemic stroke -MRI showed 2 small acute on chronic cerebral white matter lacunar infarcts, both in the right hemisphere -Neurology evaluation appreciated. -Follow PT/OT/SLP recommendations. -Echo on 03/27/2019 showed normal left ventricular function with EF of 55 to 60%. -MRI of the head and neck did not show any significant stenosis. -Continue atorvastatin.  Start aspirin once okay with GI.  Patient will need anticoagulation given recent diagnosis of paroxysmal A. Fib. -Allow for permissive hypertension.  Will DC amlodipine and clonidine for now. -Monitor mental status.  Nausea/vomiting/coffee-ground emesis Acute colitis -CT showed transverse and descending colon colitis -GI following.  No labs today.  Hemoglobin was stable on 03/29/2019.  No gross bleeding.  Suspect peptic ulcer disease.  She was on NSAIDs and prednisone at home. -Continue Protonix -Continue Zosyn for now.  GI planning for EGD and  colonoscopy probably tomorrow. -C. difficile negative, GI panel negative.  Leukocytosis -From above.  Monitor.  Acute kidney injury -Probably secondary to dehydration, ongoing NSAIDs and ACE inhibitor use. -Baseline creatinine from 1.2-1.6 -Much improved.  Creatinine back to baseline.  Ultrasound showed chronic renal disease, no hydronephrosis/obstruction  Hypotension/suspected hypovolemic shock History of hypertension -Patient had venously transferred to ICU for pressors.  Blood pressure much improved after fluid resuscitation.  Antihypertensive plan as above.  Paroxysmal new onset A. fib with RVR -Patient was treated with Cardizem drip briefly and subsequently converted to normal sinus rhythm.  She was switched to oral metoprolol. -Rapid A. fib was in the setting of sepsis so no anticoagulation was started.  Neurology recommends anticoagulation at some point once cleared by GI. -Echo showed normal left ventricular function with no new wall motion abnormalities.  Minimal troponin elevation secondary to supply demand ischemia. -Might need outpatient cardiology evaluation.  Acute hypoxic respiratory failure -Most likely secondary to fluid overload after massive fluid resuscitation -Currently on 2 L oxygen via nasal cannula. -Patient was started on Lasix.  Earlier imaging had shown pleural effusion but PCCM did not recommend thoracentesis and it improved with diuresis.  Chest x-ray on 03/30/2019 showed mild basilar atelectasis and small left effusion.  Rheumatoid arthritis -On Plaquenil at home along with Enbrel every week.  Outpatient follow-up with rheumatology.  Recently completed a course of steroids.  Avoid NSAIDs.  Thrombocytopenia -Questionable cause.  Monitor.  No signs of bleeding    DVT prophylaxis: SCDs Code Status: DNR Family Communication: Spoke to daughter Sheralyn Boatmanoni on phone on 03/31/2019 Disposition Plan: Depends on clinical outcome  Consultants:  Gastroenterology/neurology/PCCM  Procedures:  Echo  IMPRESSIONS    1. The left ventricle has normal systolic function, with an ejection fraction of 55-60%. The cavity size was normal. There is mild concentric left ventricular hypertrophy. Left ventricular diastolic Doppler parameters are consistent with impaired  relaxation. Indeterminate filling pressures.  2. The mitral valve is grossly normal. Mild thickening of the mitral valve leaflet. There is mild mitral annular calcification present. Mitral valve regurgitation is mild to moderate by color flow Doppler. The MR jet is eccentric posteriorly directed.  3. The tricuspid valve is grossly normal.  4. The aortic valve is tricuspid. Aortic valve regurgitation is trivial by color flow Doppler.  5. The aortic root is normal in size and structure.  6. There is mild dilatation of the ascending aorta measuring 39 mm.  7. The interatrial septum was not well visualized.  Antimicrobials:  Zosyn from 03/26/2019 onwards  Subjective: Patient seen and examined at bedside.  She denies any overnight fever, nausea or vomiting.  Complains of mild lower quadrant abdominal pain.  Objective: Vitals:   03/30/19 2014 03/30/19 2359 03/31/19 0334 03/31/19 0845  BP: 117/76 106/78 117/80 120/82  Pulse: 97 (!) 102 (!) 108 (!) 110  Resp: 16 18 17  (!) 28  Temp: 98.2 F (36.8 C) 98.4 F (36.9 C) 98 F (36.7 C) (!) 97.5 F (36.4 C)  TempSrc: Oral Oral Oral Oral  SpO2: 98% 90% 100% 99%  Weight:      Height:        Intake/Output Summary (Last 24 hours) at 03/31/2019 1119 Last data filed at 03/30/2019 1724 Gross per 24 hour  Intake 50 ml  Output 1100 ml  Net -1050 ml   Filed Weights   03/28/19 0400 03/29/19 0353 03/30/19 0500  Weight: 99.7 kg 94.1 kg 92.9 kg    Examination:  General exam: Appears calm and comfortable.  No acute distress Respiratory system: Bilateral decreased breath sounds at bases, some scattered crackles Cardiovascular system: S1  & S2 heard, intermittently tachycardic Gastrointestinal system: Abdomen is nondistended, soft and mildly tender in the lower quadrant.  Normal bowel sounds heard. Extremities: No cyanosis, clubbing; trace edema  Central nervous system: Alert and oriented. No focal neurological deficits. Moving extremities Skin: No rashes, lesions or ulcers Psychiatry: Flat affect.     Data Reviewed: I have personally reviewed following labs and imaging studies  CBC: Recent Labs  Lab 03/24/19 1309  03/25/19 1616 03/26/19 0405 03/27/19 0401 03/28/19 0358 03/29/19 0250 03/30/19 0418  WBC 11.6*   < > 10.7* 12.6* 13.3* 11.7* 11.8* 14.4*  NEUTROABS 10.2*  --  9.1*  --  11.4* 9.2* 9.0*  --   HGB 14.6   < > 10.4* 9.7* 9.4* 10.3* 10.3* 10.0*  HCT 46.2*   < > 32.5* 30.5* 29.5* 31.0* 32.0* 30.5*  MCV 98.1   < > 96.2 96.8 95.5 93.4 92.5 90.5  PLT 200   < > 137* 130* 110* 105* 109* 129*   < > = values in this interval not displayed.   Basic Metabolic Panel: Recent Labs  Lab 03/26/19 0405 03/27/19 0401 03/28/19 0358 03/29/19 0250 03/30/19 0418  NA 140 142 142 141 142  K 4.2 3.4* 4.1 3.6 3.4*  CL 110 108 103 101 102  CO2 20* 24 29 26 30   GLUCOSE 93 72 92 86 111*  BUN 70* 52* 45* 34* 35*  CREATININE 2.85* 2.16* 2.02* 1.59* 1.54*  CALCIUM 6.5* 7.3* 8.7* 8.3* 7.8*  MG 2.0  --   --   --   --  PHOS 5.0*  --   --   --   --    GFR: Estimated Creatinine Clearance: 42 mL/min (A) (by C-G formula based on SCr of 1.54 mg/dL (H)). Liver Function Tests: Recent Labs  Lab 03/24/19 1309 03/25/19 1616  AST 35 29  ALT 20 16  ALKPHOS 93 64  BILITOT 1.6* 0.9  PROT 8.8* 5.4*  ALBUMIN 4.4 2.4*   No results for input(s): LIPASE, AMYLASE in the last 168 hours. Recent Labs  Lab 03/25/19 1747 03/29/19 1446  AMMONIA 27 16   Coagulation Profile: Recent Labs  Lab 03/24/19 1309  INR 1.0   Cardiac Enzymes: Recent Labs  Lab 03/26/19 0612 03/26/19 1238 03/27/19 0406  TROPONINI 0.03* <0.03 0.32*    BNP (last 3 results) No results for input(s): PROBNP in the last 8760 hours. HbA1C: No results for input(s): HGBA1C in the last 72 hours. CBG: Recent Labs  Lab 03/24/19 1248  GLUCAP 158*   Lipid Profile: No results for input(s): CHOL, HDL, LDLCALC, TRIG, CHOLHDL, LDLDIRECT in the last 72 hours. Thyroid Function Tests: No results for input(s): TSH, T4TOTAL, FREET4, T3FREE, THYROIDAB in the last 72 hours. Anemia Panel: No results for input(s): VITAMINB12, FOLATE, FERRITIN, TIBC, IRON, RETICCTPCT in the last 72 hours. Sepsis Labs: Recent Labs  Lab 03/24/19 1821 03/24/19 2255 03/25/19 1616  LATICACIDVEN 6.6* 5.8* 1.8    Recent Results (from the past 240 hour(s))  SARS Coronavirus 2 (CEPHEID - Performed in [facility] hospital lab), Hosp Order     Status: None   Collection Time: 03/24/19  1:13 PM  Result Value Ref Range Status   SARS Coronavirus 2 NEGATIVE NEGATIVE Final    Comment: (NOTE) If result is NEGATIVE SARS-CoV-2 target nucleic acids are NOT DETECTED. The SARS-CoV-2 RNA is generally detectable in upper and lower  respiratory specimens during the acute phase of infection. The lowest  concentration of SARS-CoV-2 viral copies this assay can detect is 250  copies / mL. A negative result does not preclude SARS-CoV-2 infection  and should not be used as the sole basis for treatment or other  patient management decisions.  A negative result may occur with  improper specimen collection / handling, submission of specimen other  than nasopharyngeal swab, presence of viral mutation(s) within the  areas targeted by this assay, and inadequate number of viral copies  (<250 copies / mL). A negative result must be combined with clinical  observations, patient history, and epidemiological information. If result is POSITIVE SARS-CoV-2 target nucleic acids are DETECTED. The SARS-CoV-2 RNA is generally detectable in upper and lower  respiratory specimens dur ing the acute phase of  infection.  Positive  results are indicative of active infection with SARS-CoV-2.  Clinical  correlation with patient history and other diagnostic information is  necessary to determine patient infection status.  Positive results do  not rule out bacterial infection or co-infection with other viruses. If result is PRESUMPTIVE POSTIVE SARS-CoV-2 nucleic acids MAY BE PRESENT.   A presumptive positive result was obtained on the submitted specimen  and confirmed on repeat testing.  While 2019 novel coronavirus  (SARS-CoV-2) nucleic acids may be present in the submitted sample  additional confirmatory testing may be necessary for epidemiological  and / or clinical management purposes  to differentiate between  SARS-CoV-2 and other Sarbecovirus currently known to infect humans.  If clinically indicated additional testing with an alternate test  methodology [phone] is advised. The SARS-CoV-2 RNA is generally  detectable in upper and lower  respiratory sp ecimens during the acute  phase of infection. The expected result is Negative. Fact Sheet for Patients:  BoilerBrush.com.cy Fact Sheet for Healthcare Providers: https://pope.com/ This test is not yet approved or cleared by the [place] FDA and has been authorized for detection and/or diagnosis of SARS-CoV-2 by FDA under an Emergency Use Authorization (EUA).  This EUA will remain in effect (meaning this test can be used) for the duration of the COVID-19 declaration under Section 564(b)(1) of the Act, 21 U.S.C. section 360bbb-3(b)(1), unless the authorization is terminated or revoked sooner. Performed at [facility], [address], [place], [place] [address]   MRSA PCR Screening     Status: None   Collection Time: 03/24/19  8:50 PM  Result Value Ref Range Status   MRSA by PCR NEGATIVE NEGATIVE Final    Comment:        The GeneXpert MRSA Assay (FDA approved for  NASAL specimens only), is one component of a comprehensive MRSA colonization surveillance program. It is not intended to diagnose MRSA infection nor to guide or monitor treatment for MRSA infections. Performed at [facility], [address] [place], [place] [address]   Culture, blood (routine x 2)     Status: None   Collection Time: 03/25/19  6:40 PM  Result Value Ref Range Status   Specimen Description   Final    BLOOD CENTRAL LINE Performed at [facility], [address] [place], [place] [address]    Special Requests   Final    BOTTLES DRAWN AEROBIC AND ANAEROBIC Blood Culture adequate volume Performed at [facility], [address] [place], [place] [address]    Culture   Final    NO GROWTH 5 DAYS Performed at [facility], [address], [place], [place] [address]    Report Status 03/30/2019 FINAL  Final  Culture, blood (routine x 2)     Status: None   Collection Time: 03/25/19  6:54 PM  Result Value Ref Range Status   Specimen Description   Final    BLOOD ARTERIAL Performed at [facility], [address], [place], [place] [address]    Special Requests   Final    BOTTLES DRAWN AEROBIC AND ANAEROBIC Blood Culture adequate volume Performed at [facility], [address] [place], [place] [address]    Culture   Final    NO GROWTH 5 DAYS Performed at [facility], [address], [place], [place] [address]    Report Status 03/30/2019 FINAL  Final  C difficile quick scan w PCR reflex     Status: None   Collection Time: 03/26/19  8:21 AM  Result Value Ref Range Status   C Diff antigen NEGATIVE NEGATIVE Final   C Diff toxin NEGATIVE NEGATIVE Final   C Diff interpretation No C. difficile detected.  Final    Comment: Performed at [facility], [address], [place], [place] [address]  Gastrointestinal Panel by PCR , Stool     Status: None    Collection Time: 03/26/19 12:00 PM  Result Value Ref Range Status   Campylobacter species NOT DETECTED NOT DETECTED Final   Plesimonas shigelloides NOT DETECTED NOT DETECTED Final   Salmonella species NOT DETECTED NOT DETECTED Final   Yersinia enterocolitica NOT DETECTED NOT DETECTED Final   Vibrio species NOT DETECTED NOT DETECTED Final   Vibrio cholerae NOT DETECTED NOT DETECTED Final  Enteroaggregative E coli (EAEC) NOT DETECTED NOT DETECTED Final   Enteropathogenic E coli (EPEC) NOT DETECTED NOT DETECTED Final   Enterotoxigenic E coli (ETEC) NOT DETECTED NOT DETECTED Final   Shiga like toxin producing E coli (STEC) NOT DETECTED NOT DETECTED Final   Shigella/Enteroinvasive E coli (EIEC) NOT DETECTED NOT DETECTED Final   Cryptosporidium NOT DETECTED NOT DETECTED Final   Cyclospora cayetanensis NOT DETECTED NOT DETECTED Final   Entamoeba histolytica NOT DETECTED NOT DETECTED Final   Giardia lamblia NOT DETECTED NOT DETECTED Final   Adenovirus F40/41 NOT DETECTED NOT DETECTED Final   Astrovirus NOT DETECTED NOT DETECTED Final   Norovirus GI/GII NOT DETECTED NOT DETECTED Final   Rotavirus A NOT DETECTED NOT DETECTED Final   Sapovirus (I, II, IV, and V) NOT DETECTED NOT DETECTED Final    Comment: Performed at [facility], [address] [place], [place] [address]         Radiology Studies: Ct Head Wo Contrast  Result Date: 03/29/2019 CLINICAL DATA:  Hypotensive. EXAM: CT HEAD WITHOUT CONTRAST TECHNIQUE: Contiguous axial images were obtained from the base of the skull through the vertex without intravenous contrast. COMPARISON:  Head CT 01/25/2007 FINDINGS: Brain: Age advanced cerebral atrophy, ventriculomegaly and significant periventricular white matter disease, likely microvascular ischemic change. Findings progressive since the prior CT scan. Ventricles are in the midline and demonstrate normal configuration. No extra-axial fluid collections are identified. No CT  findings for acute intracranial process such as hemispheric infarction or intracranial hemorrhage. No mass lesions are identified. The brainstem and cerebellum are grossly normal. Vascular: Moderate vascular calcifications but no definite aneurysm or hyperdense vessels. Skull: No skull fracture or worrisome bone lesions. Sinuses/Orbits: The paranasal sinuses and mastoid air cells are grossly clear. The globes are intact. Other: No scalp lesions or hematoma. IMPRESSION: 1. Age advanced cerebral atrophy, ventriculomegaly and significant periventricular white matter disease. 2. No acute intracranial findings or mass lesion. Electronically Signed   By: Rudie Meyer M.D.   On: 03/29/2019 13:27   Mr Maxine Glenn Head Wo Contrast  Result Date: 03/31/2019 CLINICAL DATA:  Admission with confusion and encephalopathy. 2 right hemispheric white matter infarctions identified yesterday. EXAM: MRA NECK WITHOUT  CONTRAST MRA HEAD WITHOUT CONTRAST TECHNIQUE: Multiplanar and multiecho pulse sequences of the neck were obtained without and with intravenous contrast. Angiographic images of the neck were obtained using MRA technique without and with intravenous contast.; Angiographic images of the Circle of Willis were obtained using MRA technique without intravenous contrast. COMPARISON:  03/30/2019 FINDINGS: MRA NECK FINDINGS Brachiocephalic vessel origins are poorly seen using noncontrast technique. Both common carotid arteries are widely patent from the lower neck to the bifurcation region. Both carotid bifurcations appear widely patent without stenosis. Cervical internal carotid arteries appear normal. Antegrade flow is present in both vertebral arteries through the cervical region. The left is slightly larger than the right. No stenosis. MRA HEAD FINDINGS Both internal carotid arteries are patent through the skull base and siphon regions. No siphon stenosis. The anterior and middle cerebral vessels are patent without proximal stenosis,  aneurysm or vascular malformation. No missing distal branch vessels are identified. Both vertebral arteries are patent through the foramen magnum with the left being dominant. Both posterior inferior cerebellar arteries show flow. There is no basilar stenosis. Superior cerebellar and posterior cerebral arteries are patent. There may be some distal vessel atherosclerotic irregularity of the intracranial branch vessels, most severe in the left PCA territory where there is diminished distal vessel demonstration. IMPRESSION:  Allowing for the technical limitations of a noncontrast MR angiogram, no neck vessel abnormality is seen. Both carotid bifurcations appear widely patent. No intracranial large or medium vessel occlusion or correctable proximal stenosis. Distal vessels show atherosclerotic irregularity, particularly in the left PCA territory with there is diminished distal vessel demonstration. Electronically Signed   By: Mark  ShogPaulina Fusi On: 03/31/2019 08:45   Mr Maxine Glenn Neck Wo Contrast  Result Date: 03/31/2019 CLINICAL DATA:  Admission with confusion and encephalopathy. 2 right hemispheric white matter infarctions identified yesterday. EXAM: MRA NECK WITHOUT  CONTRAST MRA HEAD WITHOUT CONTRAST TECHNIQUE: Multiplanar and multiecho pulse sequences of the neck were obtained without and with intravenous contrast. Angiographic images of the neck were obtained using MRA technique without and with intravenous contast.; Angiographic images of the Circle of Willis were obtained using MRA technique without intravenous contrast. COMPARISON:  03/30/2019 FINDINGS: MRA NECK FINDINGS Brachiocephalic vessel origins are poorly seen using noncontrast technique. Both common carotid arteries are widely patent from the lower neck to the bifurcation region. Both carotid bifurcations appear widely patent without stenosis. Cervical internal carotid arteries appear normal. Antegrade flow is present in both vertebral arteries through the  cervical region. The left is slightly larger than the right. No stenosis. MRA HEAD FINDINGS Both internal carotid arteries are patent through the skull base and siphon regions. No siphon stenosis. The anterior and middle cerebral vessels are patent without proximal stenosis, aneurysm or vascular malformation. No missing distal branch vessels are identified. Both vertebral arteries are patent through the foramen magnum with the left being dominant. Both posterior inferior cerebellar arteries show flow. There is no basilar stenosis. Superior cerebellar and posterior cerebral arteries are patent. There may be some distal vessel atherosclerotic irregularity of the intracranial branch vessels, most severe in the left PCA territory where there is diminished distal vessel demonstration. IMPRESSION: Allowing for the technical limitations of a noncontrast MR angiogram, no neck vessel abnormality is seen. Both carotid bifurcations appear widely patent. No intracranial large or medium vessel occlusion or correctable proximal stenosis. Distal vessels show atherosclerotic irregularity, particularly in the left PCA territory with there is diminished distal vessel demonstration. Electronically Signed   By: Paulina Fusi M.D.   On: 03/31/2019 08:45   Mr Brain Wo Contrast  Result Date: 03/30/2019 CLINICAL DATA:  68 year old female with persistent encephalopathy. EXAM: MRI HEAD WITHOUT CONTRAST TECHNIQUE: Multiplanar, multiecho pulse sequences of the brain and surrounding structures were obtained without intravenous contrast. COMPARISON:  Head CT 03/29/2019. FINDINGS: Brain: Intermittent motion artifact. There is a small right posterior centrum semi of bowel focus of heterogeneous diffusion (series 3, image 36) which does appear restricted on series 300, image 36. There is a nearby chronic lacunar infarct (series 8, image 57) and generalized underlying bilateral cerebral white matter T2 and FLAIR hyperintensity. There is a 2nd  punctate area of restricted diffusion in the right periatrial white matter best seen on series 9 image 10 and series 900, image 10. No associated hemorrhage or mass effect. No other restricted diffusion. Chronic lacunar infarct of the right corona radiata and basal ganglia. Chronic lacunar infarcts in both thalami with microhemorrhage. There is a small area of chronic microhemorrhage in the right cerebellum on series 7, image 9. Probable chronic microhemorrhage in the right pons on image 13. No superimposed cortical encephalomalacia identified. No midline shift, mass effect, evidence of mass lesion, ventriculomegaly, extra-axial collection or acute intracranial hemorrhage. Cervicomedullary junction and pituitary are within normal limits. Vascular: Major intracranial vascular flow  voids are preserved. Generalized intracranial artery tortuosity. Skull and upper cervical spine: Negative visible cervical spine. Visualized bone marrow signal is within normal limits. Sinuses/Orbits: Grossly negative orbits. Paranasal Visualized paranasal sinuses and mastoids are stable and well pneumatized. Other: Scalp and face soft tissues appear negative. IMPRESSION: 1. Positive for two small acute on chronic cerebral white matter lacunar infarcts, both in the right hemisphere. No associated hemorrhage or mass effect. 2. Underlying severe chronic small vessel disease, including several chronic micro-hemorrhages. Electronically Signed   By: Odessa Fleming M.D.   On: 03/30/2019 12:45   Dg Chest Port 1 View  Result Date: 03/30/2019 CLINICAL DATA:  Follow-up pleural effusion EXAM: PORTABLE CHEST 1 VIEW COMPARISON:  03/29/2019 FINDINGS: Cardiac shadow is within normal limits. Left jugular central line is again seen and stable. The overall inspiratory effort is poor. Mild bibasilar atelectasis is noted. There are changes suggestive of small left pleural effusion posteriorly. IMPRESSION: Poor inspiratory effort with mild bibasilar atelectasis and  small left effusion. Electronically Signed   By: Alcide Clever M.D.   On: 03/30/2019 07:19   [place] Ekg Site Rite  Result Date: 03/30/2019 If Site Rite image not attached, placement could not be confirmed due to current cardiac rhythm.       Scheduled Meds: . white petrolatum      . amLODipine  10 mg Oral Daily  . atorvastatin  80 mg Oral q1800  . Chlorhexidine Gluconate Cloth  6 each Topical Daily  . cloNIDine  0.1 mg Oral BID  . colestipol  1 g Oral BID  . furosemide  40 mg Oral Daily  . hydroxychloroquine  200 mg Oral BID  . mouth rinse  15 mL Mouth Rinse BID  . metoprolol tartrate  100 mg Oral BID  . pantoprazole  40 mg Oral BID  . pneumococcal 23 valent vaccine  0.5 mL Intramuscular Tomorrow-1000   Continuous Infusions: . sodium chloride 10 mL/hr at 03/31/19 0028  . piperacillin-tazobactam (ZOSYN)  IV 3.375 g (03/31/19 1011)     LOS: 7 days        Glade Lloyd, MD [facility] 03/31/2019, 11:19 AM

## 2019-03-31 NOTE — Progress Notes (Signed)
Spoke with Dr Marisue Humble, [facility] regarding PICC placement.  Has no objection to placement.  Plan to place PICC today.

## 2019-03-31 NOTE — Progress Notes (Signed)
[facility] Gastroenterology Progress Note  Michelle Graves 68 y.o. January 15, 1951  CC: GI bleed/colitis   Subjective: Transferred to [facility].  Doing much better now.  MRI yesterday showed 2 small acute on chronic lacunar infarcts.  Doing well from GI standpoint.  Diarrhea improved.  No further bleeding episodes.  ROS : Negative for chest pain and shortness of breath.   Objective: Vital signs in last 24 hours: Vitals:   03/31/19 0334 03/31/19 0845  BP: 117/80 120/82  Pulse: (!) 108 (!) 110  Resp: 17 (!) 28  Temp: 98 F (36.7 C) (!) 97.5 F (36.4 C)  SpO2: 100% 99%    Physical Exam:  General:  /Oriented x3.  Not in acute distress.  Head:  Normocephalic, without obvious abnormality, atraumatic     Lungs:    Anterior exam only.  Occasional rhonchi.  Heart:  Regular rate and rhythm,   Abdomen:    Soft, nontender, nondistended, bowel sounds present.  Extremities: Extremities normal, atraumatic, LE edema noted.        Lab Results: Recent Labs    03/29/19 0250 03/30/19 0418  NA 141 142  K 3.6 3.4*  CL 101 102  CO2 26 30  GLUCOSE 86 111*  BUN 34* 35*  CREATININE 1.59* 1.54*  CALCIUM 8.3* 7.8*   No results for input(s): AST, ALT, ALKPHOS, BILITOT, PROT, ALBUMIN in the last 72 hours. Recent Labs    03/29/19 0250 03/30/19 0418  WBC 11.8* 14.4*  NEUTROABS 9.0*  --   HGB 10.3* 10.0*  HCT 32.0* 30.5*  MCV 92.5 90.5  PLT 109* 129*   No results for input(s): LABPROT, INR in the last 72 hours.    Assessment/Plan: -Coffee-ground emesis.  Resolved.  No further bleeding episodes -Acute blood loss anemia.  Hemoglobin stable now.   No further bleeding episodes. -Abnormal CT scan showing colitis.  Wall thickening involving distal transverse colon, descending colon and rectosigmoid colon. -A. fib.  Converted back to sinus rhythm.  -Acute ischemic stroke/ lacunar infarcts.  Recommendations ------------------------ -Patient doing much better today.  She is alert and oriented.   Able to give appropriate history. -Followed by neurology.  Looks like she will need anticoagulation given stroke in setting of recently diagnosed A. Fib.  Plan for EGD and colonoscopy tomorrow.  Okay to start aspirin from GI standpoint. Called and discussed with his daughter.  She is in agreement.  Risks (bleeding, infection, bowel perforation that could require surgery, sedation-related changes in cardiopulmonary systems), benefits (identification and possible treatment of source of symptoms, exclusion of certain causes of symptoms), and alternatives (watchful waiting, radiographic imaging studies, empiric medical treatment)  were explained to patient/family in detail and patient wishes to proceed.  -C. difficile negative.  GI pathogen negative.  -Continue antibiotics.   -Continue PPI.  Monitor H&H. -GI will follow  Kathi Der MD, FACP 03/31/2019, 12:02 PM  Contact #  [phone]

## 2019-03-31 NOTE — Progress Notes (Signed)
Occupational Therapy Treatment Patient Details Name: Michelle Graves MRN: [id number] DOB: [birth year] Today's Date: 03/31/2019    History of present illness 68 yo female admitted with acute kidney injury, hypovolemic shock, A fib with RVR, N/V with coffee ground emesis. Hx of CKD, RA, obesity, Sjogren's, chronic pain.    OT comments  Pt progressing well requiring less assist with mobility, transfers and increasing independence with ADL. Pt requires maxA for LB ADL, but able to stand x2 mins for toilet hygiene and perineal care. Pt sit to stand with minA today with cues and performing bed mobility with modA for LB management. Pt increasing ability to care for help, but still limited by pain, poor activity tolerance and decreased mobility. OT following acutely.    Follow Up Recommendations  SNF    Equipment Recommendations  Other (comment)(to be determined)    Recommendations for Other Services      Precautions / Restrictions Precautions Precautions: Fall Restrictions Weight Bearing Restrictions: No       Mobility Bed Mobility Overal bed mobility: Needs Assistance Bed Mobility: Supine to Sit;Sit to Supine     Supine to sit: Mod assist Sit to supine: Mod assist   General bed mobility comments: performing with assist for BLE movements  Transfers Overall transfer level: Needs assistance Equipment used: Rolling walker (2 wheeled) Transfers: Sit to/from Stand Sit to Stand: Min assist         General transfer comment: assist for proper hand placement    Balance Overall balance assessment: Needs assistance   Sitting balance-[name]: Fair       Standing balance-[name]: Fair                             ADL either performed or assessed with clinical judgement   ADL Overall ADL's : Needs assistance/impaired                 Upper Body Dressing : Moderate assistance;Sitting           Toileting- Clothing Manipulation and Hygiene:  Maximal assistance;Sitting/lateral lean;Sit to/from stand Toileting - Clothing Manipulation Details (indicate cue type and reason): Pt stood for toilet hygiene     Functional mobility during ADLs: Minimal assistance;Rolling walker;Cueing for safety;Cueing for sequencing General ADL Comments: Pt increasing independence to maxA for ADL. Pt stood for toilet hygiene today.     Vision   Vision Assessment?: No apparent visual deficits   Perception     Praxis      Cognition Arousal/Alertness: Awake/alert Behavior During Therapy: Flat affect Overall Cognitive Status: Within Functional Limits for tasks assessed                                 General Comments: Follows all commands        Exercises     Shoulder Instructions       General Comments VSS    Pertinent Vitals/ Pain       Pain Assessment: Faces Faces Pain Scale: No hurt Pain Intervention(s): Monitored during session  Home Living                                          Prior Functioning/Environment              Frequency  Min 2X/week        Progress Toward Goals  OT Goals(current goals can now be found in the care plan section)  Progress towards OT goals: Progressing toward goals  Acute Rehab OT Goals Patient Stated Goal: to get into bed OT Goal Formulation: With patient Time For Goal Achievement: 04/12/19 Potential to Achieve Goals: Fair ADL Goals Pt Will Perform Eating: with set-up;with supervision;sitting Pt Will Perform Grooming: with set-up;with supervision;sitting Additional ADL Goal #1: pt will perform UB adls at min A level, sitting Additional ADL Goal #2: pt will perform bed mobility at mod A in preparation for adls Additional ADL Goal #3: pt will perform sit to stand for adls and toilet transfers with mod +2 assistance Additional ADL Goal #4: pt will follow one step commands in context with 90% accuracy  Plan Discharge plan remains appropriate     Co-evaluation                 AM-PAC OT "6 Clicks" Daily Activity     Outcome Measure   Help from another person eating meals?: A Little Help from another person taking care of personal grooming?: A Little Help from another person toileting, which includes using toliet, bedpan, or urinal?: A Lot Help from another person bathing (including washing, rinsing, drying)?: A Lot Help from another person to put on and taking off regular upper body clothing?: A Little Help from another person to put on and taking off regular lower body clothing?: A Lot 6 Click Score: 15    End of Session Equipment Utilized During Treatment: Gait belt;Rolling walker  OT Visit Diagnosis: Muscle weakness (generalized) (M62.81)   Activity Tolerance Patient limited by fatigue   Patient Left in bed;with call bell/phone within reach;with bed alarm set;with nursing/sitter in room   Nurse Communication Mobility status        Time: 1610-9604 OT Time Calculation (min): 23 min  Charges: OT General Charges $OT Visit: 1 Visit OT Treatments $Self Care/Home Management : 23-37 mins  Revonda Standard Cecil Cranker) Glendell Docker OTR/L Acute Rehabilitation Services Pager: [phone] Office: [phone]   Gunnar Fusi Benigno Check 03/31/2019, 2:52 PM

## 2019-03-31 NOTE — Progress Notes (Signed)
Physical Therapy Treatment Patient Details Name: Michelle Graves MRN: [id number] DOB: 04/25/1951 Today's Date: 03/31/2019    History of Present Illness 68 yo female admitted with acute kidney injury, hypovolemic shock, A fib with RVR, N/V with coffee ground emesis. Hx of CKD, RA, obesity, Sjogren's, chronic pain. Pt developed delirium 5/30. transferred to [facility] from [facility] 6/3. MRI 6/3-positive for 2 small acute on chronic cerebral white matter lacunar infarcts, both in the right hemisphere.  No associated hemorrhage or mass-effect    PT Comments    Patient seen for mobility progression. Pt is alert, oriented to person and place, and agreeable to participate in therapy. Pt is making progress toward PT goals. Pt requires grossly mod A +1 for mobility during session and able to tolerate gait distance of 4 ft after functional transfers X 3. Pt presents with decreased activity tolerance and fatigued and SOB with limited mobility. Pt will continue to benefit from further skilled PT services in both acute and post acute settings to maximize independence and safety with mobility.   Follow Up Recommendations  SNF     Equipment Recommendations  None recommended by PT    Recommendations for Other Services       Precautions / Restrictions Precautions Precautions: Fall Restrictions Weight Bearing Restrictions: No    Mobility  Bed Mobility Overal bed mobility: Needs Assistance Bed Mobility: Supine to Sit     Supine to sit: Mod assist Sit to supine: Mod assist   General bed mobility comments: assist to bring hips to EOB and to elevate trunk into sitting  Transfers Overall transfer level: Needs assistance Equipment used: Rolling walker (2 wheeled);1 person hand held assist Transfers: Sit to/from [employer] Sit to Stand: Min assist;Mod assist Stand pivot transfers: Mod assist;Min assist       General transfer comment: pt stood from EOB with mod A, pivoted bed to [facility] with mod  A HHA +1, min A to stand from [facility] with use of RW upon standing; cues for safe hand placement and assist to power up and to steady  Ambulation/Gait Ambulation/Gait assistance: Min assist Gait Distance (Feet): 4 Feet Assistive device: Rolling walker (2 wheeled) Gait Pattern/deviations: Step-through pattern;Decreased stride length;Trunk flexed Gait velocity: decreased   General Gait Details: cues for posture and safe use of AD; pt fatigued and SOB with short distance gait requiring assitance to steady and manage RW    Stairs             Wheelchair Mobility    Modified Rankin (Stroke Patients Only)       Balance Overall balance assessment: Needs assistance Sitting-balance support: No upper extremity supported;Feet supported Sitting balance-[name]: Fair     Standing balance support: Bilateral upper extremity supported;During functional activity Standing balance-[name]: Fair Standing balance comment: bilat UE for gait                            Cognition Arousal/Alertness: Awake/alert Behavior During Therapy: Flat affect Overall Cognitive Status: Within Functional Limits for tasks assessed                                 General Comments: Follows all commands; appreciative of assistance to mobilize and for hygiene      Exercises      General Comments General comments (skin integrity, edema, etc.): pt requires assistance for pericare at [facility]; pt  had taken O2 off prior to arrival and SpO2 84% on RA sitting EOB; 2L O2 donned and RN notified that pt may not keep O2 on despite education to keep it on      Pertinent Vitals/Pain Pain Assessment: Faces Faces Pain Scale: No hurt Pain Intervention(s): Monitored during session    Home Living                      Prior Function            PT Goals (current goals can now be found in the care plan section) Progress towards PT goals: Progressing toward goals    Frequency     Min 3X/week      PT Plan Current plan remains appropriate    Co-evaluation              AM-PAC PT "6 Clicks" Mobility   Outcome Measure  Help needed turning from your back to your side while in a flat bed without using bedrails?: A Little Help needed moving from lying on your back to sitting on the side of a flat bed without using bedrails?: A Lot Help needed moving to and from a bed to a chair (including a wheelchair)?: A Little Help needed standing up from a chair using your arms (e.g., wheelchair or bedside chair)?: A Little Help needed to walk in hospital room?: A Lot Help needed climbing 3-5 steps with a railing? : A Lot 6 Click Score: 15    End of Session Equipment Utilized During Treatment: Gait belt;Oxygen Activity Tolerance: Patient tolerated treatment well Patient left: in chair;with call bell/phone within reach;with chair alarm set Nurse Communication: Mobility status PT Visit Diagnosis: Muscle weakness (generalized) (M62.81);Difficulty in walking, not elsewhere classified (R26.2)     Time: 3567-0141 PT Time Calculation (min) (ACUTE ONLY): 47 min  Charges:  $Gait Training: 8-22 mins $Therapeutic Activity: 8-22 mins                     Erline Levine, PTA Acute Rehabilitation Services Pager: [phone] Office: [phone]     Carolynne Edouard 03/31/2019, 3:11 PM

## 2019-03-31 NOTE — Progress Notes (Signed)
STROKE TEAM PROGRESS NOTE   INTERVAL HISTORY Patient sitting in chair, still complains of same abdominal pain, not severe but persistent.  Neurologically stable, patient feels she is back to neuro baseline.  On aspirin, okayed by GI.  Pending EGD/colonoscopy tomorrow.  Cardiology consulted for possible A. fib.  Vitals:   03/30/19 2359 03/31/19 0334 03/31/19 0845 03/31/19 1207  BP: 106/78 117/80 120/82 102/71  Pulse: (!) 102 (!) 108 (!) 110 92  Resp: 18 17 (!) 28 (!) 21  Temp: 98.4 F (36.9 C) 98 F (36.7 C) (!) 97.5 F (36.4 C) 97.6 F (36.4 C)  TempSrc: Oral Oral Oral Oral  SpO2: 90% 100% 99% 99%  Weight:      Height:        CBC:  Recent Labs  Lab 03/28/19 0358 03/29/19 0250 03/30/19 0418  WBC 11.7* 11.8* 14.4*  NEUTROABS 9.2* 9.0*  --   HGB 10.3* 10.3* 10.0*  HCT 31.0* 32.0* 30.5*  MCV 93.4 92.5 90.5  PLT 105* 109* 129*    Basic Metabolic Panel:  Recent Labs  Lab 03/26/19 0405  03/29/19 0250 03/30/19 0418  NA 140   < > 141 142  K 4.2   < > 3.6 3.4*  CL 110   < > 101 102  CO2 20*   < > 26 30  GLUCOSE 93   < > 86 111*  BUN 70*   < > 34* 35*  CREATININE 2.85*   < > 1.59* 1.54*  CALCIUM 6.5*   < > 8.3* 7.8*  MG 2.0  --   --   --   PHOS 5.0*  --   --   --    < > = values in this interval not displayed.   Lipid Panel:     Component Value Date/Time   CHOL 205 (H) 08/15/2008 2151   TRIG 88 08/15/2008 2151   HDL 84 08/15/2008 2151   CHOLHDL 2.4 Ratio 08/15/2008 2151   VLDL 18 08/15/2008 2151   LDLCALC 103 (H) 08/15/2008 2151   HgbA1c: No results found for: HGBA1C Urine Drug Screen: No results found for: LABOPIA, COCAINSCRNUR, LABBENZ, AMPHETMU, THCU, LABBARB  Alcohol Level No results found for: [facility]  IMAGING Ct Head Wo Contrast  Result Date: 03/29/2019 CLINICAL DATA:  Hypotensive. EXAM: CT HEAD WITHOUT CONTRAST TECHNIQUE: Contiguous axial images were obtained from the base of the skull through the vertex without intravenous contrast. COMPARISON:  Head CT  01/25/2007 FINDINGS: Brain: Age advanced cerebral atrophy, ventriculomegaly and significant periventricular white matter disease, likely microvascular ischemic change. Findings progressive since the prior CT scan. Ventricles are in the midline and demonstrate normal configuration. No extra-axial fluid collections are identified. No CT findings for acute intracranial process such as hemispheric infarction or intracranial hemorrhage. No mass lesions are identified. The brainstem and cerebellum are grossly normal. Vascular: Moderate vascular calcifications but no definite aneurysm or hyperdense vessels. Skull: No skull fracture or worrisome bone lesions. Sinuses/Orbits: The paranasal sinuses and mastoid air cells are grossly clear. The globes are intact. Other: No scalp lesions or hematoma. IMPRESSION: 1. Age advanced cerebral atrophy, ventriculomegaly and significant periventricular white matter disease. 2. No acute intracranial findings or mass lesion. Electronically Signed   By: Rudie Meyer M.D.   On: 03/29/2019 13:27   Mr Maxine Glenn Head Wo Contrast  Result Date: 03/31/2019 CLINICAL DATA:  Admission with confusion and encephalopathy. 2 right hemispheric white matter infarctions identified yesterday. EXAM: MRA NECK WITHOUT  CONTRAST MRA HEAD WITHOUT CONTRAST TECHNIQUE:  Multiplanar and multiecho pulse sequences of the neck were obtained without and with intravenous contrast. Angiographic images of the neck were obtained using MRA technique without and with intravenous contast.; Angiographic images of the Circle of Willis were obtained using MRA technique without intravenous contrast. COMPARISON:  03/30/2019 FINDINGS: MRA NECK FINDINGS Brachiocephalic vessel origins are poorly seen using noncontrast technique. Both common carotid arteries are widely patent from the lower neck to the bifurcation region. Both carotid bifurcations appear widely patent without stenosis. Cervical internal carotid arteries appear normal.  Antegrade flow is present in both vertebral arteries through the cervical region. The left is slightly larger than the right. No stenosis. MRA HEAD FINDINGS Both internal carotid arteries are patent through the skull base and siphon regions. No siphon stenosis. The anterior and middle cerebral vessels are patent without proximal stenosis, aneurysm or vascular malformation. No missing distal branch vessels are identified. Both vertebral arteries are patent through the foramen magnum with the left being dominant. Both posterior inferior cerebellar arteries show flow. There is no basilar stenosis. Superior cerebellar and posterior cerebral arteries are patent. There may be some distal vessel atherosclerotic irregularity of the intracranial branch vessels, most severe in the left PCA territory where there is diminished distal vessel demonstration. IMPRESSION: Allowing for the technical limitations of a noncontrast MR angiogram, no neck vessel abnormality is seen. Both carotid bifurcations appear widely patent. No intracranial large or medium vessel occlusion or correctable proximal stenosis. Distal vessels show atherosclerotic irregularity, particularly in the left PCA territory with there is diminished distal vessel demonstration. Electronically Signed   By: Paulina Fusi M.D.   On: 03/31/2019 08:45   Mr Maxine Glenn Neck Wo Contrast  Result Date: 03/31/2019 CLINICAL DATA:  Admission with confusion and encephalopathy. 2 right hemispheric white matter infarctions identified yesterday. EXAM: MRA NECK WITHOUT  CONTRAST MRA HEAD WITHOUT CONTRAST TECHNIQUE: Multiplanar and multiecho pulse sequences of the neck were obtained without and with intravenous contrast. Angiographic images of the neck were obtained using MRA technique without and with intravenous contast.; Angiographic images of the Circle of Willis were obtained using MRA technique without intravenous contrast. COMPARISON:  03/30/2019 FINDINGS: MRA NECK FINDINGS  Brachiocephalic vessel origins are poorly seen using noncontrast technique. Both common carotid arteries are widely patent from the lower neck to the bifurcation region. Both carotid bifurcations appear widely patent without stenosis. Cervical internal carotid arteries appear normal. Antegrade flow is present in both vertebral arteries through the cervical region. The left is slightly larger than the right. No stenosis. MRA HEAD FINDINGS Both internal carotid arteries are patent through the skull base and siphon regions. No siphon stenosis. The anterior and middle cerebral vessels are patent without proximal stenosis, aneurysm or vascular malformation. No missing distal branch vessels are identified. Both vertebral arteries are patent through the foramen magnum with the left being dominant. Both posterior inferior cerebellar arteries show flow. There is no basilar stenosis. Superior cerebellar and posterior cerebral arteries are patent. There may be some distal vessel atherosclerotic irregularity of the intracranial branch vessels, most severe in the left PCA territory where there is diminished distal vessel demonstration. IMPRESSION: Allowing for the technical limitations of a noncontrast MR angiogram, no neck vessel abnormality is seen. Both carotid bifurcations appear widely patent. No intracranial large or medium vessel occlusion or correctable proximal stenosis. Distal vessels show atherosclerotic irregularity, particularly in the left PCA territory with there is diminished distal vessel demonstration. Electronically Signed   By: Paulina Fusi M.D.   On: 03/31/2019 08:45  Mr Brain 44Wo Contrast  Result Date: 03/30/2019 CLINICAL DATA:  68 year old female with persistent encephalopathy. EXAM: MRI HEAD WITHOUT CONTRAST TECHNIQUE: Multiplanar, multiecho pulse sequences of the brain and surrounding structures were obtained without intravenous contrast. COMPARISON:  Head CT 03/29/2019. FINDINGS: Brain: Intermittent  motion artifact. There is a small right posterior centrum semi of bowel focus of heterogeneous diffusion (series 3, image 36) which does appear restricted on series 300, image 36. There is a nearby chronic lacunar infarct (series 8, image 57) and generalized underlying bilateral cerebral white matter T2 and FLAIR hyperintensity. There is a 2nd punctate area of restricted diffusion in the right periatrial white matter best seen on series 9 image 10 and series 900, image 10. No associated hemorrhage or mass effect. No other restricted diffusion. Chronic lacunar infarct of the right corona radiata and basal ganglia. Chronic lacunar infarcts in both thalami with microhemorrhage. There is a small area of chronic microhemorrhage in the right cerebellum on series 7, image 9. Probable chronic microhemorrhage in the right pons on image 13. No superimposed cortical encephalomalacia identified. No midline shift, mass effect, evidence of mass lesion, ventriculomegaly, extra-axial collection or acute intracranial hemorrhage. Cervicomedullary junction and pituitary are within normal limits. Vascular: Major intracranial vascular flow voids are preserved. Generalized intracranial artery tortuosity. Skull and upper cervical spine: Negative visible cervical spine. Visualized bone marrow signal is within normal limits. Sinuses/Orbits: Grossly negative orbits. Paranasal Visualized paranasal sinuses and mastoids are stable and well pneumatized. Other: Scalp and face soft tissues appear negative. IMPRESSION: 1. Positive for two small acute on chronic cerebral white matter lacunar infarcts, both in the right hemisphere. No associated hemorrhage or mass effect. 2. Underlying severe chronic small vessel disease, including several chronic micro-hemorrhages. Electronically Signed   By: Odessa FlemingH  Hall M.D.   On: 03/30/2019 12:45   Dg Chest Port 1 View  Result Date: 03/30/2019 CLINICAL DATA:  Follow-up pleural effusion EXAM: PORTABLE CHEST 1 VIEW  COMPARISON:  03/29/2019 FINDINGS: Cardiac shadow is within normal limits. Left jugular central line is again seen and stable. The overall inspiratory effort is poor. Mild bibasilar atelectasis is noted. There are changes suggestive of small left pleural effusion posteriorly. IMPRESSION: Poor inspiratory effort with mild bibasilar atelectasis and small left effusion. Electronically Signed   By: Alcide CleverMark  Lukens M.D.   On: 03/30/2019 07:19   [place] Ekg Site Rite  Result Date: 03/30/2019 If Site Rite image not attached, placement could not be confirmed due to current cardiac rhythm.   PHYSICAL EXAM  Temp:  [97.5 F (36.4 C)-98.4 F (36.9 C)] 97.6 F (36.4 C) (06/04 1207) Pulse Rate:  [92-116] 116 (06/04 1643) Resp:  [16-28] 17 (06/04 1643) BP: (102-120)/(71-82) 110/81 (06/04 1643) SpO2:  [90 %-100 %] 100 % (06/04 1643)  General - Well nourished, well developed, in no apparent distress.  Ophthalmologic - fundi not visualized due to noncooperation.  Cardiovascular - Regular rate and rhythm.  Mental Status -  Level of arousal and orientation to time, place, and person were intact. Language including expression, naming, repetition, comprehension was assessed and found intact. Fund of Knowledge was assessed and was intact.  Cranial Nerves II - XII - II - Visual field intact OU. III, IV, VI - Extraocular movements intact. V - Facial sensation intact bilaterally. VII - Facial movement intact bilaterally. VIII - Hearing & vestibular intact bilaterally. X - Palate elevates symmetrically. XI - Chin turning & shoulder shrug intact bilaterally. XII - Tongue protrusion intact.  Motor Strength - The patient's  strength was symmetrical in all extremities and pronator drift was absent.  Bulk was normal and fasciculations were absent.   Motor Tone - Muscle tone was assessed at the neck and appendages and was normal.  Reflexes - The patient's reflexes were symmetrical in all extremities and she had no  pathological reflexes.  Sensory - Light touch, temperature/pinprick were assessed and were symmetrical.    Coordination - The patient had normal movements in the hands with no ataxia or dysmetria.  Tremor was absent.  Gait and Station - deferred.   ASSESSMENT/PLAN Ms. Fenton MallingBarbara J Graves is a 68 y.o. female with history of HTN, diverticulitis and arthritis presenting with nausea, black emesis and abd pain x 3 days. Found to be hypotensive with AKI, colitis. Developed delirium in hospital for which a MRI was done which showed new stroke. She had transient AF w/ RVR, new onset.   Incidental stroke:   2 small R periventriclar WM infarcts most likely d/t small vessel disease, but cannot rule out embolic source if PAF confirmed by cardiology  CT head age advanced small vessel disease, atrophy & ventriculomegaly. No acute finding.     MRI  Small infarcts at left peri-occipital horn and peri-frontal horn of lateral ventricle. Severe Small vessel disease. Several chronic microhemorrhages.  MRA head L PCA irregularity, o/w Unremarkable   MRA neck Unremarkable   2D Echo EF 50-55%. 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   Therapy recommendations:  SNF  Disposition:  pending   ?? PAF with RVR  Home anticoagulation:  none   Converted back to NSR  CHA2DS2-VASc Score = 5, ?2 oral anticoagulation recommended  Age in Years:  8065-74   +1    Sex:  Female   +1    Hypertension History:  yes   +1     Diabetes Mellitus:  0  Congestive Heart Failure History:  0  Vascular Disease History:  0    Stroke/TIA/Thromboembolism History:  yes   +2 . Cardiology consulted and doubt for afib . Recommend EP on board to confirm or to consider loop recorder . If afib confirmed or captured, will recommend anticoagulation for stroke prevention if cleared by GI  GIB/acute  colitis  Coffee ground emesis resolved  Acute blood loss anemia stable Hgb 10  Colitis on CT abd/pelvis  For colon and EGD tomorrow  Ok with GI to resume aspirin 81  On PPI  Also need GI clearance if pt needs anticoagulation based on cardiology evaluation  Hypotension Hx Hypertension  BP Stable . BP goal normotensive  Possible Hyperlipidemia  Home meds:  no statin  Now on lipitor 80  LDL pending , goal < 70  Adjust statin based on LDL  If LDL 70 or more, continue statin at discharge  Other Stroke Risk Factors  Advanced age  ETOH use, advised to drink no more than 1 drink(s) a day  Obesity, Body mass index is 32.08 kg/m., recommend weight loss, diet and exercise as appropriate   Other Active Problems  Leukocytosis WBC 11.8->14.4  AKI Cr 2.02->1.54  RA  Thrombocytopenia PLT 129  Hospital day # 7  I spent  35 minutes in total face-to-face time with the patient, more than 50% of which was spent in counseling and coordination of care, reviewing test results, images and medication, and discussing the  diagnosis of stroke, possible AF, GI bleeding and anemia, treatment plan and potential prognosis. This patient's care requiresreview of multiple databases, neurological assessment, discussion with family, other specialists and medical decision making of high complexity.  Discussed with Dr. Hanley BenAlekh.  Marvel PlanJindong Ladon Heney, MD PhD Stroke Neurology 03/31/2019 6:37 PM    To contact Stroke Continuity provider, please refer to [email or web address]. After hours, contact General Neurology

## 2019-03-31 NOTE — Progress Notes (Signed)
Per Dr Pauletta Browns patient no longer needs PICC placement. Peripheral IV will suffice for vascular access. Patient RN to remove patient CVC.

## 2019-03-31 NOTE — Evaluation (Signed)
Clinical/Bedside Swallow Evaluation Patient Details  Name: Michelle Graves MRN: [id number] Date of Birth: 03-31-1951  Today's Date: Graves Time: SLP Start Time (ACUTE ONLY): 6415 SLP Stop Time (ACUTE ONLY): 0853 SLP Time Calculation (min) (ACUTE ONLY): 18 min  Past Medical History:  Past Medical History:  Diagnosis Date  . Arthritis   . Chest pain   . Diverticulitis   . Hypertension    Past Surgical History:  Past Surgical History:  Procedure Laterality Date  . TONSILLECTOMY     HPI:  Michelle Graves is a 68 y.o. female with medical history significant of hypertension, CKD stage III, rheumatoid arthritis. Admitted due to nausea, black emesis/stool and abdominal pain.  Dx with colitis, AKI from hypovolemia, developed acute metabolic encephalopathy and respiratory failure from pleural effusions and atelectasis and anemia from GI bleed. 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 No associated hemorrahge.   Assessment / Plan / Recommendation Clinical Impression  Pt admitted for GI issues, now with small right infarcts on MRI. Her facial and lingual movements are adequate. Dentures cleaned and SLP retrieved denture cream although she declined solids due to nausea following water and puree trials. Consumption of 2 oz water and applesauce was without s/s aspiration. Strong volitional cough and no current respiratory issues. Aspiration risk appears greater with potential episodes of emesis. She may handle full liquids better than solids short term and will upgrade as able. Pills whole in applesauce.  SLP Visit Diagnosis: Dysphagia, unspecified (R13.10)    Aspiration Risk  Mild aspiration risk    Diet Recommendation Thin liquid;Other (Comment)(full liquids)   Liquid Administration via: Cup;Straw Medication Administration: Whole meds with liquid Supervision: Patient able to self  feed Compensations: Slow rate;Small sips/bites Postural Changes: Seated upright at 90 degrees    Other  Recommendations Oral Care Recommendations: Oral care BID   Follow up Recommendations None      Frequency and Duration min 1 x/week  1 week       Prognosis Prognosis for Safe Diet Advancement: Good      Swallow Study   General HPI: Michelle Graves is a 68 y.o. female with medical history significant of hypertension, CKD stage III, rheumatoid arthritis. Admitted due to nausea, black emesis/stool and abdominal pain.  Dx with colitis, AKI from hypovolemia, developed acute metabolic encephalopathy and respiratory failure from pleural effusions and atelectasis and anemia from GI bleed. 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 No associated hemorrahge. Type of Study: Bedside Swallow Evaluation Previous Swallow Assessment: (none) Diet Prior to this Study: NPO Temperature Spikes Noted: No Respiratory Status: Nasal cannula History of Recent Intubation: No Behavior/Cognition: Alert;Cooperative;Pleasant mood Oral Cavity Assessment: Dry Oral Care Completed by SLP: Yes Oral Cavity - Dentition: Dentures, top;Dentures, bottom Vision: Functional for self-feeding Self-Feeding Abilities: Able to feed self Patient Positioning: Upright in bed Baseline Vocal Quality: Normal Volitional Cough: Strong Volitional Swallow: Able to elicit    Oral/Motor/Sensory Function Overall Oral Motor/Sensory Function: Within functional limits   Ice Chips Ice chips: Not tested   Thin Liquid Thin Liquid: Within functional limits Presentation: Cup;Straw    Nectar Thick Nectar Thick Liquid: Not tested   Honey Thick Honey Thick Liquid: Not tested   Puree Puree: Within functional limits   Solid     Solid: Not tested(delined due to nausea)      Michelle Graves  Michelle Graves,9:11 AM   Michelle Graves.Ed  Nurse, children's [phone] Office [phone]

## 2019-03-31 NOTE — H&P (View-Only) (Signed)
[facility] Progress Note  Salli J Canche 68 y.o. 01/17/1951  CC: GI bleed/colitis   Subjective: Transferred to [facility].  Doing much better now.  MRI yesterday showed 2 small acute on chronic lacunar infarcts.  Doing well from GI standpoint.  Diarrhea improved.  No further bleeding episodes.  ROS : Negative for chest pain and shortness of breath.   Objective: Vital signs in last 24 hours: Vitals:   03/31/19 0334 03/31/19 0845  BP: 117/80 120/82  Pulse: (!) 108 (!) 110  Resp: 17 (!) 28  Temp: 98 F (36.7 C) (!) 97.5 F (36.4 C)  SpO2: 100% 99%    Physical Exam:  General:  /Oriented x3.  Not in acute distress.  Head:  Normocephalic, without obvious abnormality, atraumatic     Lungs:    Anterior exam only.  Occasional rhonchi.  Heart:  Regular rate and rhythm,   Abdomen:    Soft, nontender, nondistended, bowel sounds present.  Extremities: Extremities normal, atraumatic, LE edema noted.        Lab Results: Recent Labs    03/29/19 0250 03/30/19 0418  NA 141 142  K 3.6 3.4*  CL 101 102  CO2 26 30  GLUCOSE 86 111*  BUN 34* 35*  CREATININE 1.59* 1.54*  CALCIUM 8.3* 7.8*   No results for input(s): AST, ALT, ALKPHOS, BILITOT, PROT, ALBUMIN in the last 72 hours. Recent Labs    03/29/19 0250 03/30/19 0418  WBC 11.8* 14.4*  NEUTROABS 9.0*  --   HGB 10.3* 10.0*  HCT 32.0* 30.5*  MCV 92.5 90.5  PLT 109* 129*   No results for input(s): LABPROT, INR in the last 72 hours.    Assessment/Plan: -Coffee-ground emesis.  Resolved.  No further bleeding episodes -Acute blood loss anemia.  Hemoglobin stable now.   No further bleeding episodes. -Abnormal CT scan showing colitis.  Wall thickening involving distal transverse colon, descending colon and rectosigmoid colon. -A. fib.  Converted back to sinus rhythm.  -Acute ischemic stroke/ lacunar infarcts.  Recommendations ------------------------ -Patient doing much better today.  She is alert and oriented.   Able to give appropriate history. -Followed by neurology.  Looks like she will need anticoagulation given stroke in setting of recently diagnosed A. Fib.  Plan for EGD and colonoscopy tomorrow.  Okay to start aspirin from GI standpoint. Called and discussed with his daughter.  She is in agreement.  Risks (bleeding, infection, bowel perforation that could require surgery, sedation-related changes in cardiopulmonary systems), benefits (identification and possible treatment of source of symptoms, exclusion of certain causes of symptoms), and alternatives (watchful waiting, radiographic imaging studies, empiric medical treatment)  were explained to patient/family in detail and patient wishes to proceed.  -C. difficile negative.  GI pathogen negative.  -Continue antibiotics.   -Continue PPI.  Monitor H&H. -GI will follow  Tanyika Barros MD, FACP 03/31/2019, 12:02 PM  Contact #  [phone] 

## 2019-03-31 NOTE — Consult Note (Addendum)
Cardiology Consultation:   Patient ID: Michelle Graves MRN: [id number]; DOB: [birth year]  Admit date: 03/24/2019 Date of Consult: 03/31/2019  Primary Care Provider: [facility] Primary Cardiologist: [facility] Primary Electrophysiologist:  None    Patient Profile:   Michelle Graves is a 68 y.o. female with a hx of hypertension, CKD stage 3,  diverticulitis, arthritis and shortness of breath who is being seen today for the evaluation of atrial fibrillation at the request of Dr. Hanley Ben.  History of Present Illness:   Michelle Graves with history as above who presented to the hospital on 03/24/19 with nausea, black emesis and abdominal pain for 3 days. Her HR was elevated at 100 and BP 80/50. Her creatinine was 5.04 with bilirubin 1.6. Hgb stable at 14. She was given Protonix, fluid boluses and admitted for acute renal failure. She received neo-synephrine briefly for BP support. CT scan showed diffuse colitis.   On 03/26/19 she had SVT followed by A Fib. She converted to sinus rhythm with cardizem drip. Echo showed normal LV systolic function and no regional wall motion abnormality.   She developed delirium and MRI showed new stroke, 2 small lacumar infarcts most likely due to small vessel disease but per neurology cannot rule out emboli source given new onset atrial fibrillation.   CHA2DS2/VAS Stroke Risk Score 5 (HTN, Age, stroke (2), female)   Upon review of EKGs and telemetry in Epic, I do not see any clear evidence of atrial fibrillation. There is SVT or possibly atrial tachycardia. Also some probable sinus tach with PACs.    Upon my assessment Michelle Graves denies any prior know afib or irregular heart beat. No chest pain, shortness of breath or palpitations. She complains of mid abdominal pain. Abdomen soft.   Michelle Graves was [facility] for shortness of breath. She apparently had coronary calcification on CT from 03/2018 but  stress test was reassuring. Shortness of breath could not be explained and felt possibly related to deconditioning. Risk factor modification was recommended and monitoring of blood pressure.   Past Medical History:  Diagnosis Date  . Arthritis   . Chest pain   . Diverticulitis   . Hypertension     Past Surgical History:  Procedure Laterality Date  . TONSILLECTOMY       Home Medications:  Prior to Admission medications   Medication Sig Start Date End Date Taking? Authorizing Provider  amLODipine (NORVASC) 10 MG tablet Take 10 mg by mouth daily.   Yes [provider]  aspirin EC 81 MG tablet Take 81 mg by mouth daily.   Yes [provider]  calcium carbonate (OSCAL) 1500 (600 Ca) MG TABS tablet Take 600 mg of elemental calcium by mouth daily.   Yes [provider]  Carboxymethylcellul-Glycerin (CLEAR EYES FOR DRY EYES OP) Place 2 drops into both eyes daily as needed (dry eyes).   Yes [provider]  cloNIDine (CATAPRES) 0.3 MG tablet Take 0.3 mg by mouth daily. 05/03/18  Yes [provider]  ENBREL SURECLICK 50 MG/ML injection Inject 50 mg into the muscle once a week. 02/28/19  Yes [provider]  hydrochlorothiazide (HYDRODIURIL) 25 MG tablet Take 1 tablet (25 mg total) by mouth daily. 04/22/18  Yes Mesner, Wesleigh Cower, MD  HYDROcodone-acetaminophen (NORCO) 10-325 MG tablet Take 1 tablet by mouth daily as needed for pain. 03/15/19  Yes [provider]  hydroxychloroquine (PLAQUENIL) 200 MG tablet Take 200 mg by mouth 2 (two)  times daily.  06/16/16  Yes [provider]  lisinopril (ZESTRIL) 40 MG tablet Take 40 mg by mouth daily. 03/01/19  Yes [provider]  metoprolol tartrate (LOPRESSOR) 100 MG tablet Take 100 mg by mouth 2 (two) times daily.  11/19/15  Yes [provider]  Misc Natural Products (OSTEO BI-FLEX JOINT SHIELD PO) Take 1 tablet by mouth daily.   Yes [provider]  Multiple  Vitamins-Minerals (CENTRUM SILVER 50+WOMEN PO) Take 1 tablet by mouth daily.   Yes [provider]  naproxen (NAPROSYN) 500 MG tablet Take 500 mg by mouth daily.  03/15/18  Yes [provider]  omeprazole (PRILOSEC) 20 MG capsule Take 20 mg by mouth 2 (two) times daily. 06/16/16  Yes [provider]  potassium chloride SA (K-DUR,KLOR-CON) 20 MEQ tablet Take 2 tablets (40 mEq total) by mouth daily. 04/22/18  Yes Mesner, Dietra Cower, MD  terbinafine (LAMISIL) 250 MG tablet Take 250 mg by mouth daily. 03/15/18  Yes [provider]  tiZANidine (ZANAFLEX) 2 MG tablet Take 4 mg by mouth at bedtime.    Yes [provider]  traZODone (DESYREL) 100 MG tablet Take 100 mg by mouth at bedtime.  03/01/19  Yes [provider]  cyclobenzaprine (FLEXERIL) 10 MG tablet Take 1 tablet (10 mg total) by mouth 2 (two) times daily as needed for muscle spasms. Patient not taking: Reported on 03/24/2019 11/21/18   Margarita Grizzle, MD  gabapentin (NEURONTIN) 300 MG capsule Take 1 capsule (300 mg total) by mouth 3 (three) times daily. Patient not taking: Reported on 03/24/2019 03/10/18   Kerrin Champagne, MD  predniSONE (DELTASONE) 20 MG tablet Take 1 tablet (20 mg total) by mouth daily. Patient not taking: Reported on 03/24/2019 05/19/18   Fayrene Helper, PA-C    Inpatient Medications: Scheduled Meds: . atorvastatin  80 mg Oral q1800  . Chlorhexidine Gluconate Cloth  6 each Topical Daily  . colestipol  1 g Oral BID  . furosemide  40 mg Oral Daily  . hydroxychloroquine  200 mg Oral BID  . mouth rinse  15 mL Mouth Rinse BID  . metoprolol tartrate  100 mg Oral BID  . pantoprazole  40 mg Oral BID  . pneumococcal 23 valent vaccine  0.5 mL Intramuscular Tomorrow-1000  . polyethylene glycol-electrolytes  4,000 mL Oral Once   Continuous Infusions: . sodium chloride 10 mL/hr at 03/31/19 0028  . piperacillin-tazobactam (ZOSYN)  IV 3.375 g (03/31/19 1011)   PRN Meds: acetaminophen **OR**  acetaminophen, HYDROcodone-acetaminophen, labetalol, morphine injection, ondansetron **OR** ondansetron (ZOFRAN) IV  Allergies:   No Known Allergies  Social History:   Social History   Socioeconomic History  . Marital status: Divorced    Spouse name: Not on file  . Number of children: Not on file  . Years of education: Not on file  . Highest education level: Not on file  Occupational History  . Not on file  Social Needs  . Financial resource strain: Not on file  . Food insecurity:    Worry: Not on file    Inability: Not on file  . Transportation needs:    Medical: Not on file    Non-medical: Not on file  Tobacco Use  . Smoking status: Never Smoker  . Smokeless tobacco: Never Used  Substance and Sexual Activity  . Alcohol use: Yes    Alcohol/week: 21.0 standard drinks    Types: 21 Glasses of wine per week    Comment: sometimes   . Drug  use: No  . Sexual activity: Yes    Partners: Male    Birth control/protection: Condom  Lifestyle  . Physical activity:    Days per week: Not on file    Minutes per session: Not on file  . Stress: Not on file  Relationships  . Social connections:    Talks on phone: Not on file    Gets together: Not on file    Attends religious service: Not on file    Active member of club or organization: Not on file    Attends meetings of clubs or organizations: Not on file    Relationship status: Not on file  . Intimate partner violence:    Fear of current or ex partner: Not on file    Emotionally abused: Not on file    Physically abused: Not on file    Forced sexual activity: Not on file  Other Topics Concern  . Not on file  Social History Narrative  . Not on file    Family History:    Family History  Problem Relation Age of Onset  . Hypertension Mother   . Diabetes Mother   . CAD Father        died of MI at age 52  . Hypertension Father   . Diabetes Sister   . Diabetes Sister   . Kidney disease Neg Hx      ROS:  Please see the  history of present illness.   All other ROS reviewed and negative.     Physical Exam/Data:   Vitals:   03/31/19 0334 03/31/19 0845 03/31/19 1207 03/31/19 1643  BP: 117/80 120/82 102/71 110/81  Pulse: (!) 108 (!) 110 92 (!) 116  Resp: 17 (!) 28 (!) 21 17  Temp: 98 F (36.7 C) (!) 97.5 F (36.4 C) 97.6 F (36.4 C)   TempSrc: Oral Oral Oral   SpO2: 100% 99% 99% 100%  Weight:      Height:        Intake/Output Summary (Last 24 hours) at 03/31/2019 1722 Last data filed at 03/30/2019 1724 Gross per 24 hour  Intake -  Output 1100 ml  Net -1100 ml   Last 3 Weights 03/30/2019 03/29/2019 03/28/2019  Weight (lbs) 204 lb 12.9 oz 207 lb 7.3 oz 219 lb 12.8 oz  Weight (kg) 92.9 kg 94.1 kg 99.7 kg     Body mass index is 32.08 kg/m.  General:  Well nourished, well developed, in no acute distress HEENT: normal Lymph: no adenopathy Neck: no JVD Endocrine:  No thryomegaly Vascular: No carotid bruits; FA pulses 2+ bilaterally without bruits  Cardiac:  normal S1, S2; RRR; no murmur  Lungs:  clear to auscultation bilaterally, no wheezing, rhonchi or rales  Abd: soft, mid abd tenderness, no hepatomegaly  Ext: no edema Musculoskeletal:  No deformities, BUE and BLE strength normal and equal Skin: warm and dry  Neuro:  CNs 2-12 intact, no focal abnormalities noted Psych:  Normal affect   EKG:  The EKG was personally reviewed and demonstrates:  Sinus tachycardia 114 bpm with Premature atrial complexes Cannot rule out Anterior infarct , age undetermined Telemetry:  Telemetry was personally reviewed and demonstrates:  Sinus tachycardia 100-110's  Relevant CV Studies:  Echocardiogram 03/27/2019 IMPRESSIONS   1. The left ventricle has normal systolic function, with an ejection fraction of 55-60%. The cavity size was normal. There is mild concentric left ventricular hypertrophy. Left ventricular diastolic Doppler parameters are consistent with impaired  relaxation. Indeterminate filling pressures.  2. The mitral valve is grossly normal. Mild thickening of the mitral valve leaflet. There is mild mitral annular calcification present. Mitral valve regurgitation is mild to moderate by color flow Doppler. The MR jet is eccentric posteriorly directed.  3. The tricuspid valve is grossly normal.  4. The aortic valve is tricuspid. Aortic valve regurgitation is trivial by color flow Doppler.  5. The aortic root is normal in size and structure.  6. There is mild dilatation of the ascending aorta measuring 39 mm.  7. The interatrial septum was not well visualized.  FINDINGS  Left Ventricle: The left ventricle has normal systolic function, with an ejection fraction of 55-60%. The cavity size was normal. There is mild concentric left ventricular hypertrophy. Left ventricular diastolic Doppler parameters are consistent with  impaired relaxation. Indeterminate filling pressures   Laboratory Data:  Chemistry Recent Labs  Lab 03/28/19 0358 03/29/19 0250 03/30/19 0418  NA 142 141 142  K 4.1 3.6 3.4*  CL 103 101 102  CO2 29 26 30   GLUCOSE 92 86 111*  BUN 45* 34* 35*  CREATININE 2.02* 1.59* 1.54*  CALCIUM 8.7* 8.3* 7.8*  GFRNONAA 25* 33* 35*  GFRAA 29* 39* 40*  ANIONGAP 10 14 10     Recent Labs  Lab 03/25/19 1616  PROT 5.4*  ALBUMIN 2.4*  AST 29  ALT 16  ALKPHOS 64  BILITOT 0.9   Hematology Recent Labs  Lab 03/28/19 0358 03/29/19 0250 03/30/19 0418  WBC 11.7* 11.8* 14.4*  RBC 3.32* 3.46* 3.37*  HGB 10.3* 10.3* 10.0*  HCT 31.0* 32.0* 30.5*  MCV 93.4 92.5 90.5  MCH 31.0 29.8 29.7  MCHC 33.2 32.2 32.8  RDW 14.9 14.8 14.8  PLT 105* 109* 129*   Cardiac Enzymes Recent Labs  Lab 03/26/19 0612 03/26/19 1238 03/27/19 0406  TROPONINI 0.03* <0.03 0.32*   No results for input(s): TROPIPOC in the last 168 hours.  BNPNo results for input(s): BNP, PROBNP in the last 168 hours.  DDimer No results for input(s): DDIMER in the last 168 hours.  Radiology/Studies:  Ct Head Wo  Contrast  Result Date: 03/29/2019 CLINICAL DATA:  Hypotensive. EXAM: CT HEAD WITHOUT CONTRAST TECHNIQUE: Contiguous axial images were obtained from the base of the skull through the vertex without intravenous contrast. COMPARISON:  Head CT 01/25/2007 FINDINGS: Brain: Age advanced cerebral atrophy, ventriculomegaly and significant periventricular white matter disease, likely microvascular ischemic change. Findings progressive since the prior CT scan. Ventricles are in the midline and demonstrate normal configuration. No extra-axial fluid collections are identified. No CT findings for acute intracranial process such as hemispheric infarction or intracranial hemorrhage. No mass lesions are identified. The brainstem and cerebellum are grossly normal. Vascular: Moderate vascular calcifications but no definite aneurysm or hyperdense vessels. Skull: No skull fracture or worrisome bone lesions. Sinuses/Orbits: The paranasal sinuses and mastoid air cells are grossly clear. The globes are intact. Other: No scalp lesions or hematoma. IMPRESSION: 1. Age advanced cerebral atrophy, ventriculomegaly and significant periventricular white matter disease. 2. No acute intracranial findings or mass lesion. Electronically Signed   By: Rudie MeyerP.  Gallerani M.D.   On: 03/29/2019 13:27   Mr Maxine GlennMra Head Wo Contrast  Result Date: 03/31/2019 CLINICAL DATA:  Admission with confusion and encephalopathy. 2 right hemispheric white matter infarctions identified yesterday. EXAM: MRA NECK WITHOUT  CONTRAST MRA HEAD WITHOUT CONTRAST TECHNIQUE: Multiplanar and multiecho pulse sequences of the neck were obtained without and with intravenous contrast. Angiographic images of the neck were obtained using MRA technique without and  with intravenous contast.; Angiographic images of the Circle of Willis were obtained using MRA technique without intravenous contrast. COMPARISON:  03/30/2019 FINDINGS: MRA NECK FINDINGS Brachiocephalic vessel origins are poorly seen  using noncontrast technique. Both common carotid arteries are widely patent from the lower neck to the bifurcation region. Both carotid bifurcations appear widely patent without stenosis. Cervical internal carotid arteries appear normal. Antegrade flow is present in both vertebral arteries through the cervical region. The left is slightly larger than the right. No stenosis. MRA HEAD FINDINGS Both internal carotid arteries are patent through the skull base and siphon regions. No siphon stenosis. The anterior and middle cerebral vessels are patent without proximal stenosis, aneurysm or vascular malformation. No missing distal branch vessels are identified. Both vertebral arteries are patent through the foramen magnum with the left being dominant. Both posterior inferior cerebellar arteries show flow. There is no basilar stenosis. Superior cerebellar and posterior cerebral arteries are patent. There may be some distal vessel atherosclerotic irregularity of the intracranial branch vessels, most severe in the left PCA territory where there is diminished distal vessel demonstration. IMPRESSION: Allowing for the technical limitations of a noncontrast MR angiogram, no neck vessel abnormality is seen. Both carotid bifurcations appear widely patent. No intracranial large or medium vessel occlusion or correctable proximal stenosis. Distal vessels show atherosclerotic irregularity, particularly in the left PCA territory with there is diminished distal vessel demonstration. Electronically Signed   By: Paulina FusiMark  Shogry M.D.   On: 03/31/2019 08:45   Mr Maxine GlennMra Neck Wo Contrast  Result Date: 03/31/2019 CLINICAL DATA:  Admission with confusion and encephalopathy. 2 right hemispheric white matter infarctions identified yesterday. EXAM: MRA NECK WITHOUT  CONTRAST MRA HEAD WITHOUT CONTRAST TECHNIQUE: Multiplanar and multiecho pulse sequences of the neck were obtained without and with intravenous contrast. Angiographic images of the neck  were obtained using MRA technique without and with intravenous contast.; Angiographic images of the Circle of Willis were obtained using MRA technique without intravenous contrast. COMPARISON:  03/30/2019 FINDINGS: MRA NECK FINDINGS Brachiocephalic vessel origins are poorly seen using noncontrast technique. Both common carotid arteries are widely patent from the lower neck to the bifurcation region. Both carotid bifurcations appear widely patent without stenosis. Cervical internal carotid arteries appear normal. Antegrade flow is present in both vertebral arteries through the cervical region. The left is slightly larger than the right. No stenosis. MRA HEAD FINDINGS Both internal carotid arteries are patent through the skull base and siphon regions. No siphon stenosis. The anterior and middle cerebral vessels are patent without proximal stenosis, aneurysm or vascular malformation. No missing distal branch vessels are identified. Both vertebral arteries are patent through the foramen magnum with the left being dominant. Both posterior inferior cerebellar arteries show flow. There is no basilar stenosis. Superior cerebellar and posterior cerebral arteries are patent. There may be some distal vessel atherosclerotic irregularity of the intracranial branch vessels, most severe in the left PCA territory where there is diminished distal vessel demonstration. IMPRESSION: Allowing for the technical limitations of a noncontrast MR angiogram, no neck vessel abnormality is seen. Both carotid bifurcations appear widely patent. No intracranial large or medium vessel occlusion or correctable proximal stenosis. Distal vessels show atherosclerotic irregularity, particularly in the left PCA territory with there is diminished distal vessel demonstration. Electronically Signed   By: Paulina FusiMark  Shogry M.D.   On: 03/31/2019 08:45   Mr Brain Wo Contrast  Result Date: 03/30/2019 CLINICAL DATA:  68 year old female with persistent  encephalopathy. EXAM: MRI HEAD WITHOUT CONTRAST TECHNIQUE: Multiplanar, multiecho  pulse sequences of the brain and surrounding structures were obtained without intravenous contrast. COMPARISON:  Head CT 03/29/2019. FINDINGS: Brain: Intermittent motion artifact. There is a small right posterior centrum semi of bowel focus of heterogeneous diffusion (series 3, image 36) which does appear restricted on series 300, image 36. There is a nearby chronic lacunar infarct (series 8, image 57) and generalized underlying bilateral cerebral white matter T2 and FLAIR hyperintensity. There is a 2nd punctate area of restricted diffusion in the right periatrial white matter best seen on series 9 image 10 and series 900, image 10. No associated hemorrhage or mass effect. No other restricted diffusion. Chronic lacunar infarct of the right corona radiata and basal ganglia. Chronic lacunar infarcts in both thalami with microhemorrhage. There is a small area of chronic microhemorrhage in the right cerebellum on series 7, image 9. Probable chronic microhemorrhage in the right pons on image 13. No superimposed cortical encephalomalacia identified. No midline shift, mass effect, evidence of mass lesion, ventriculomegaly, extra-axial collection or acute intracranial hemorrhage. Cervicomedullary junction and pituitary are within normal limits. Vascular: Major intracranial vascular flow voids are preserved. Generalized intracranial artery tortuosity. Skull and upper cervical spine: Negative visible cervical spine. Visualized bone marrow signal is within normal limits. Sinuses/Orbits: Grossly negative orbits. Paranasal Visualized paranasal sinuses and mastoids are stable and well pneumatized. Other: Scalp and face soft tissues appear negative. IMPRESSION: 1. Positive for two small acute on chronic cerebral white matter lacunar infarcts, both in the right hemisphere. No associated hemorrhage or mass effect. 2. Underlying severe chronic small  vessel disease, including several chronic micro-hemorrhages. Electronically Signed   By: Odessa Fleming M.D.   On: 03/30/2019 12:45   Dg Chest Port 1 View  Result Date: 03/30/2019 CLINICAL DATA:  Follow-up pleural effusion EXAM: PORTABLE CHEST 1 VIEW COMPARISON:  03/29/2019 FINDINGS: Cardiac shadow is within normal limits. Left jugular central line is again seen and stable. The overall inspiratory effort is poor. Mild bibasilar atelectasis is noted. There are changes suggestive of small left pleural effusion posteriorly. IMPRESSION: Poor inspiratory effort with mild bibasilar atelectasis and small left effusion. Electronically Signed   By: Alcide Clever M.D.   On: 03/30/2019 07:19   Dg Chest Port 1 View  Result Date: 03/29/2019 CLINICAL DATA:  Follow-up pleural effusion EXAM: PORTABLE CHEST 1 VIEW COMPARISON:  03/28/2019 FINDINGS: Cardiac shadow remains enlarged. Left jugular central line is again seen and stable. Mild increased density is noted over the left chest consistent with small pleural effusion. The overall inspiratory effort is poor. No bony abnormality is noted. IMPRESSION: Persistent left posterior effusion. Electronically Signed   By: Alcide Clever M.D.   On: 03/29/2019 07:58   Dg Chest Port 1 View  Result Date: 03/28/2019 CLINICAL DATA:  Follow-up pleural effusion EXAM: PORTABLE CHEST 1 VIEW COMPARISON:  03/26/2019 FINDINGS: Cardiac shadow is stable. Left jugular central line is again seen and stable. Increasing density over the left hemithorax is noted consistent with a posteriorly layering effusion. Left basilar atelectasis is noted as well stable from the prior exam. No new focal abnormality is noted. IMPRESSION: Stable changes on the left similar to that seen on prior CT. Electronically Signed   By: Alcide Clever M.D.   On: 03/28/2019 07:22   [place] Ekg Site Rite  Result Date: 03/30/2019 If Site Rite image not attached, placement could not be confirmed due to current cardiac rhythm.   Assessment  and Plan:   New onset atrial fibrillation? -Per notes pt developed afib on  03/26/19. Converted to sinus rhythm cardizem.  -Per review of EKGs and tele strips in Epic does not show clear afib. May consider having EP weigh in on rhythm prior to committing pt to anticoagulation. Could consider inserting loop recorder to definitively diagnose afib.  -Now maintaining sinus rhythm/ST on beta blocker, metoprolol 100 mg bid per home routine.  -CHA2DS2/VAS Stroke Risk Score 5 (HTN, Age, stroke (2), female). There is indication for anticoagulation for stroke risk reduction. If deemed to have had afib, would like to start DOAC once OK with GI, after EGD and colonoscopy tomorrow.   Hypertension -Neuro allowing permissive HTN in setting of stroke. Amlodipine and clonidine on hold. Lisinopril on hold for renal function. On home metoprolol 100 mg BID -BP is currently well controlled.    New stroke -per neurology, 2 small R brain lacunar infarcts most likely d/t small vessel disease but cannot rule out embolic source given new onset AF this admission. -Now on aspirin 81 mg, OK'd by GI.  -On statin  CKD stage 3 -Reportedly baseline SCr ~1.6.  -ARF on admission due to hypovolemia with SCr 5.  -Ultrasound showed chronic renal disease, no hydronephrosis/obstruction -Renal function much improved with SCr 1.54 on 6/3.  -Lisinopril on hold.   GI bleed/acute colitis -Coffee ground emesis on admission. Pt was on NSAIDs and prednisone at home.  -Acute blood loss anemia. Hgb 10, stable.  -On PPI -Now doing well from a GI standpoint. No further bleeding episodes.  -Plan for EGD and colonoscopy tomorrow.       For questions or updates, please contact [facility] Please consult [email or web address] for contact info under     Signed, Berton Bon, NP  03/31/2019 5:22 PM   I have personally seen and examined this patient. I agree with the assessment and plan as outlined above.  She is a 68 yo female with  history of HTN, CKD and diverticulitis. She was admitted last week with abdominal pain and acute on chronic renal failure. She briefly required pressors. She had coffee ground emesis. She has been seen by GI. Also found to have acute CVA. Plans for EGD and colonoscopy tomorrow. She had documented SVT on 03/26/19. The notes from physicians on 03/26/19 state that she had SVT that converted to atrial fib with RVR then sinus. I cannot find any telemetry tracings with atrial fib. I do see strips with SVT then sinus with PACs. No atrial fibrillation is documented in the chart.  She has no complaints today. Denies chest pain or dyspnea.  My exam:  General: Well developed, well nourished, NAD  HEENT: OP clear, mucus membranes moist  SKIN: warm, dry. No rashes.  Neuro: No focal deficits  Musculoskeletal: Muscle strength 5/5 all ext  Psychiatric: Mood and affect normal  Neck: No JVD, no carotid bruits, no thyromegaly, no lymphadenopathy.  Lungs:Clear bilaterally, no wheezes, rhonci, crackles  Cardiovascular: Regular rate and rhythm. No murmurs, gallops or rubs.  Abdomen:Soft. Bowel sounds present. Non-tender.  Extremities: No lower extremity edema. Pulses are 2 + in the bilateral DP/PT.   Plan:SVT/possible atrial fibrillation: Sinus tach today. I have extensively reviewed telemetry and the chart including saved strips and see no atrial fibrillation. She clearly had SVT and then sinus with PACs (which was irregular). If there is no atrial fib, she would not need to be on long term anti-coagulation. We may be able to hold off on anti-coagulation for now and consider a Loop recorder. Given recent stroke and possible need for anti-coagulation, I  agree with plans for definitive GI evaluation. We will follow with you tomorrow after her GI testing.   Verne CarrowChristopher McAlhany 03/31/2019 5:23 PM

## 2019-04-01 ENCOUNTER — Encounter (HOSPITAL_COMMUNITY): Payer: Self-pay | Admitting: *Deleted

## 2019-04-01 ENCOUNTER — Encounter (HOSPITAL_COMMUNITY)
Admission: EM | Disposition: A | Discharge status: Skilled Nursing Facility | Payer: Self-pay | Source: Home / Self Care | Attending: Internal Medicine

## 2019-04-01 ENCOUNTER — Inpatient Hospital Stay (HOSPITAL_COMMUNITY): Payer: Medicare Other | Admitting: Registered Nurse

## 2019-04-01 HISTORY — PX: ESOPHAGOGASTRODUODENOSCOPY (EGD) WITH PROPOFOL: SHX5813

## 2019-04-01 HISTORY — PX: COLONOSCOPY WITH PROPOFOL: SHX5780

## 2019-04-01 HISTORY — PX: BIOPSY: SHX5522

## 2019-04-01 LAB — CBC WITH DIFFERENTIAL/PLATELET
Abs Immature Granulocytes: 0.48 10*3/uL — ABNORMAL HIGH (ref 0.00–0.07)
Basophils Absolute: 0.1 10*3/uL (ref 0.0–0.1)
Basophils Relative: 0 %
Eosinophils Absolute: 0.1 10*3/uL (ref 0.0–0.5)
Eosinophils Relative: 1 %
HCT: 31.6 % — ABNORMAL LOW (ref 36.0–46.0)
Hemoglobin: 10.6 g/dL — ABNORMAL LOW (ref 12.0–15.0)
Immature Granulocytes: 3 %
Lymphocytes Relative: 12 %
Lymphs Abs: 1.8 10*3/uL (ref 0.7–4.0)
MCH: 29.7 pg (ref 26.0–34.0)
MCHC: 33.5 g/dL (ref 30.0–36.0)
MCV: 88.5 fL (ref 80.0–100.0)
Monocytes Absolute: 1.1 10*3/uL — ABNORMAL HIGH (ref 0.1–1.0)
Monocytes Relative: 7 %
Neutro Abs: 11.7 10*3/uL — ABNORMAL HIGH (ref 1.7–7.7)
Neutrophils Relative %: 77 %
Platelets: 253 10*3/uL (ref 150–400)
RBC: 3.57 MIL/uL — ABNORMAL LOW (ref 3.87–5.11)
RDW: 14.5 % (ref 11.5–15.5)
WBC: 15.3 10*3/uL — ABNORMAL HIGH (ref 4.0–10.5)
nRBC: 0 % (ref 0.0–0.2)

## 2019-04-01 LAB — LIPID PANEL
Cholesterol: 64 mg/dL (ref 0–200)
HDL: 17 mg/dL — ABNORMAL LOW (ref >40)
LDL Cholesterol: 27 mg/dL (ref 0–99)
Total CHOL/HDL Ratio: 3.8 RATIO
Triglycerides: 100 mg/dL (ref <150)
VLDL: 20 mg/dL (ref 0–40)

## 2019-04-01 LAB — BASIC METABOLIC PANEL
Anion gap: 12 (ref 5–15)
BUN: 25 mg/dL — ABNORMAL HIGH (ref 8–23)
CO2: 25 mmol/L (ref 22–32)
Calcium: 8.2 mg/dL — ABNORMAL LOW (ref 8.9–10.3)
Chloride: 102 mmol/L (ref 98–111)
Creatinine, Ser: 1.57 mg/dL — ABNORMAL HIGH (ref 0.44–1.00)
GFR calc Af Amer: 39 mL/min — ABNORMAL LOW (ref >60)
GFR calc non Af Amer: 34 mL/min — ABNORMAL LOW (ref >60)
Glucose, Bld: 103 mg/dL — ABNORMAL HIGH (ref 70–99)
Potassium: 3.2 mmol/L — ABNORMAL LOW (ref 3.5–5.1)
Sodium: 139 mmol/L (ref 135–145)

## 2019-04-01 LAB — HEMOGLOBIN A1C
Hgb A1c MFr Bld: 6.2 % — ABNORMAL HIGH (ref 4.8–5.6)
Mean Plasma Glucose: 131.24 mg/dL

## 2019-04-01 LAB — MAGNESIUM: Magnesium: 1.6 mg/dL — ABNORMAL LOW (ref 1.7–2.4)

## 2019-04-01 SURGERY — ESOPHAGOGASTRODUODENOSCOPY (EGD) WITH PROPOFOL
Anesthesia: Monitor Anesthesia Care

## 2019-04-01 MED ORDER — SODIUM CHLORIDE 0.9 % IV SOLN
INTRAVENOUS | Status: DC | PRN
  Administered 2019-04-01: 25 ug/min via INTRAVENOUS

## 2019-04-01 MED ORDER — MAGNESIUM SULFATE 2 GM/50ML IV SOLN
2.0000 g | Freq: Once | INTRAVENOUS | Status: AC
  Administered 2019-04-01: 2 g via INTRAVENOUS
  Filled 2019-04-01: qty 50

## 2019-04-01 MED ORDER — LACTATED RINGERS IV SOLN
INTRAVENOUS | Status: DC | PRN
  Administered 2019-04-01: 14:00:00 via INTRAVENOUS

## 2019-04-01 MED ORDER — SODIUM CHLORIDE 0.9 % IV SOLN: INTRAVENOUS | Status: DC

## 2019-04-01 MED ORDER — LIDOCAINE 2% (20 MG/ML) 5 ML SYRINGE
INTRAMUSCULAR | Status: DC | PRN
  Administered 2019-04-01: 40 mg via INTRAVENOUS

## 2019-04-01 MED ORDER — LACTATED RINGERS IV SOLN
INTRAVENOUS | Status: AC | PRN
  Administered 2019-04-01: 1000 mL via INTRAVENOUS

## 2019-04-01 MED ORDER — CLOPIDOGREL BISULFATE 75 MG PO TABS
75.0000 mg | ORAL_TABLET | Freq: Every day | ORAL | Status: DC
  Administered 2019-04-01 – 2019-04-04 (×4): 75 mg via ORAL
  Filled 2019-04-01 (×4): qty 1

## 2019-04-01 MED ORDER — PROPOFOL 10 MG/ML IV BOLUS
INTRAVENOUS | Status: DC | PRN
  Administered 2019-04-01 (×4): 20 mg via INTRAVENOUS

## 2019-04-01 MED ORDER — POTASSIUM CHLORIDE 10 MEQ/100ML IV SOLN
10.0000 meq | INTRAVENOUS | Status: AC
  Administered 2019-04-01 (×4): 10 meq via INTRAVENOUS
  Filled 2019-04-01: qty 100

## 2019-04-01 MED ORDER — PROPOFOL 500 MG/50ML IV EMUL
INTRAVENOUS | Status: DC | PRN
  Administered 2019-04-01: 50 ug/kg/min via INTRAVENOUS

## 2019-04-01 SURGICAL SUPPLY — 25 items

## 2019-04-01 NOTE — Transfer of Care (Signed)
Immediate Anesthesia Transfer of Care Note  Patient: Michelle Graves  Procedure(s) Performed: ESOPHAGOGASTRODUODENOSCOPY (EGD) WITH PROPOFOL (N/A ) COLONOSCOPY WITH PROPOFOL (N/A ) BIOPSY  Patient Location: Endoscopy Unit  Anesthesia Type:MAC  Level of Consciousness: awake, alert  and patient cooperative  Airway & Oxygen Therapy: Patient Spontanous Breathing and Patient connected to face mask oxygen  Post-op Assessment: Report given to RN and Post -op Vital signs reviewed and stable  Post vital signs: Reviewed and stable  Last Vitals:  Vitals Value Taken Time  BP 114/99 04/01/2019  2:48 PM  Temp 36.9 C 04/01/2019  2:48 PM  Pulse 99 04/01/2019  2:48 PM  Resp 23 04/01/2019  2:54 PM  SpO2 100 % 04/01/2019  2:48 PM  Vitals shown include unvalidated device data.  Last Pain:  Vitals:   04/01/19 1448  TempSrc: Temporal  PainSc: 3       Patients Stated Pain Goal: 0 (31/43/88 8757)  Complications: No apparent anesthesia complications

## 2019-04-01 NOTE — Op Note (Signed)
03/24/2019 - 04/01/2019  2:57 PM  PATIENT:  Michelle Graves  68 y.o. female  PRE-OPERATIVE DIAGNOSIS:  Coffee-ground emesis, colitis, diarrhea  POST-OPERATIVE DIAGNOSIS:  EGD- biopsy taken at gastric ulcer Colon-colitlis biopsy taken at sigmoid colon  PROCEDURE:  Procedure(s): ESOPHAGOGASTRODUODENOSCOPY (EGD) WITH PROPOFOL (N/A) COLONOSCOPY WITH PROPOFOL (N/A) BIOPSY  SURGEON:  Surgeon(s) and Role:    * Roisin Mones, MD - Primary  Findings ----------- -EGD showed linear gastric ulcers without any active bleeding. - Colonoscopy  showed severe (possible)  ischemic colitis extending from distal transverse colon up to proximal sigmoid colon.  There are some areas of hemorrhagic changes in the distal sigmoid colon.  Scope was advanced up to ascending colon and procedure was terminated to avoid further complication from looping.  -Hold off on anticoagulation for at least 3 to 5 days given hemorrhagic changes in the sigmoid colon. -Okay to start aspirin or Plavix (preferably as aspirin is ulcerogenic) from GI standpoint. -GI will sign off.  Call us back if needed -Follow up in GI clinic in 4 to 6 weeks after discharge.  Kathi Der MD, FACP 04/01/2019, 3:07 PM  Contact #  [phone]

## 2019-04-01 NOTE — Op Note (Signed)
[facility] Patient Name: Michelle Graves Procedure Date : 04/01/2019 MRN: [id number] Attending MD: Kathi DerParag Bryssa Tones , MD Date of Birth: 07/03/1951 CSN: [id number] Age: 68 Admit Type: Inpatient Procedure:                Upper GI endoscopy Indications:              Coffee-ground emesis Providers:                Kathi DerParag Antonae Zbikowski, MD, Clearnce Sorrelarrie Baker, RN, Matthew FolksBrooke                            Cummings, Technician Referring MD:              Medicines:                Sedation Administered by an Anesthesia Professional Complications:            No immediate complications. Estimated Blood Loss:     Estimated blood loss was minimal. Procedure:                Pre-Anesthesia Assessment:                           - Prior to the procedure, a History and Physical                            was performed, and patient medications and                            allergies were reviewed. The patient's tolerance of                            previous anesthesia was also reviewed. The risks                            and benefits of the procedure and the sedation                            options and risks were discussed with the patient.                            All questions were answered, and informed consent                            was obtained. Prior Anticoagulants: The patient has                            taken no previous anticoagulant or antiplatelet                            agents. ASA Grade Assessment: III - A patient with                            severe systemic disease. After reviewing the risks  and benefits, the patient was deemed in                            satisfactory condition to undergo the procedure.                           After obtaining informed consent, the endoscope was                            passed under direct vision. Throughout the                            procedure, the patient's blood pressure, pulse, and             oxygen saturations were monitored continuously. The                            GIF-H190 ([id number]) Olympus gastroscope was                            introduced through the mouth, and advanced to the                            second part of duodenum. The upper GI endoscopy was                            accomplished without difficulty. The patient                            tolerated the procedure well. Scope In: Scope Out: Findings:      A non-obstructing Schatzki ring was found at the gastroesophageal       junction.      A small hiatal hernia was present.      The exam of the esophagus was otherwise normal.      Few non-bleeding linear gastric ulcers were found in the gastric body.       Biopsies were taken with a cold forceps for histology.      The duodenal bulb, first portion of the duodenum and second portion of       the duodenum were normal. Impression:               - Non-obstructing Schatzki ring.                           - Small hiatal hernia.                           - Non-bleeding gastric ulcers. Biopsied.                           - Normal duodenal bulb, first portion of the                            duodenum and second portion of the duodenum. Recommendation:           - Perform a colonoscopy today. Procedure Code(s):        ---  Professional ---                           [phone], Esophagogastroduodenoscopy, flexible,                            transoral; with biopsy, single or multiple Diagnosis Code(s):        --- Professional ---                           K22.2, Esophageal obstruction                           K25.9, Gastric ulcer, unspecified as acute or                            chronic, without hemorrhage or perforation                           K92.0, Hematemesis CPT copyright 2019 American Medical Association. All rights reserved. The codes documented in this report are preliminary and upon coder review may  be revised to meet current compliance  requirements. Kathi DerParag Duvan Mousel, MD Kathi DerParag Mathan Darroch, MD 04/01/2019 2:42:23 PM Number of Addenda: 0

## 2019-04-01 NOTE — Anesthesia Postprocedure Evaluation (Signed)
Anesthesia Post Note  Patient: Michelle Graves  Procedure(s) Performed: ESOPHAGOGASTRODUODENOSCOPY (EGD) WITH PROPOFOL (N/A ) COLONOSCOPY WITH PROPOFOL (N/A ) BIOPSY     Patient location during evaluation: PACU Anesthesia Type: MAC Level of consciousness: awake and alert Pain management: pain level controlled Vital Signs Assessment: post-procedure vital signs reviewed and stable Respiratory status: spontaneous breathing and respiratory function stable Cardiovascular status: stable Postop Assessment: no apparent nausea or vomiting Anesthetic complications: no    Last Vitals:  Vitals:   04/01/19 1500 04/01/19 1508  BP: 122/64 125/68  Pulse:  93  Resp: (!) 23 (!) 24  Temp:    SpO2: 99% 100%    Last Pain:  Vitals:   04/01/19 1500  TempSrc:   PainSc: 3                  [name]

## 2019-04-01 NOTE — Progress Notes (Signed)
Patient drank half of Golytely, refused the remaining. Contacted MD Parag Brahhalt on [phone]. There was no reply.

## 2019-04-01 NOTE — Interval H&P Note (Signed)
History and Physical Interval Note:  04/01/2019 1:46 PM  Michelle Graves  has presented today for surgery, with the diagnosis of Coffee-ground emesis, colitis, diarrhea.  The various methods of treatment have been discussed with the patient and family. After consideration of risks, benefits and other options for treatment, the patient has consented to  Procedure(s): ESOPHAGOGASTRODUODENOSCOPY (EGD) WITH PROPOFOL (N/A) COLONOSCOPY WITH PROPOFOL (N/A) as a surgical intervention.  The patient's history has been reviewed, patient examined, no change in status, stable for surgery.  I have reviewed the patient's chart and labs.  Questions were answered to the patient's satisfaction.    Risks (bleeding, infection, bowel perforation that could require surgery, sedation-related changes in cardiopulmonary systems), benefits (identification and possible treatment of source of symptoms, exclusion of certain causes of symptoms), and alternatives (watchful waiting, radiographic imaging studies, empiric medical treatment)  were explained to patient in detail and patient wishes to proceed.  Ednah Hammock

## 2019-04-01 NOTE — Anesthesia Preprocedure Evaluation (Signed)
Anesthesia Evaluation  Patient identified by MRN, date of birth, ID band Patient awake    Reviewed: Allergy & Precautions, NPO status , Patient's Chart, lab work & pertinent test results  Airway Mallampati: II  TM Distance: >3 FB     Dental  (+) Edentulous Upper, Edentulous Lower   Pulmonary    breath sounds clear to auscultation + decreased breath sounds      Cardiovascular hypertension,  Rhythm:Regular Rate:Normal     Neuro/Psych    GI/Hepatic   Endo/Other    Renal/GU      Musculoskeletal   Abdominal   Peds  Hematology   Anesthesia Other Findings   Reproductive/Obstetrics                             Anesthesia Physical Anesthesia Plan  ASA: III  Anesthesia Plan: MAC   Post-op Pain Management:    Induction: Intravenous  PONV Risk Score and Plan: Ondansetron and Propofol infusion  Airway Management Planned: Natural Airway and Nasal Cannula  Additional Equipment:   Intra-op Plan:   Post-operative Plan:   Informed Consent: I have reviewed the patients History and Physical, chart, labs and discussed the procedure including the risks, benefits and alternatives for the proposed anesthesia with the patient or authorized representative who has indicated his/her understanding and acceptance.       Plan Discussed with: CRNA and Anesthesiologist  Anesthesia Plan Comments:         Anesthesia Quick Evaluation

## 2019-04-01 NOTE — Anesthesia Procedure Notes (Signed)
Procedure Name: MAC Date/Time: 04/01/2019 1:59 PM Performed by: Jearld Pies, CRNA Oxygen Delivery Method: Simple face mask

## 2019-04-01 NOTE — Progress Notes (Signed)
SLP Cancellation Note  Patient Details Name: Michelle Graves MRN: [id number] DOB: [birth year]   Cancelled treatment:        Pt currently having EGD. Will continue efforts.    Royce Macadamia 04/01/2019, 1:31 PM  Breck Coons Lonell Face.Ed Nurse, children's [phone] Office [phone]

## 2019-04-01 NOTE — Progress Notes (Signed)
Patient off floor for GI testing.  See my full consult note yesterday.   I have reviewed her records extensively including all telemetry, EKGs that are available and strips that are saved. I have asked the EP team to also review the strips, EKGs and telemetry. No evidence of atrial fibrillation on 03/26/19 based on available data. She did have SVT, paroxysmal atrial tach and sinus tach with PACs. There is no evidence of atrial fibrillation. Would not advise that she be started on long term anti-coagulation unless she has other indications.   We will follow up tomorrow. Can consider an event monitor post discharge.   Verne Carrow 04/01/2019 2:00 PM

## 2019-04-01 NOTE — Op Note (Signed)
[facility] Patient Name: Michelle Graves Procedure Date : 04/01/2019 MRN: [id number] Attending MD: Kathi Der , MD Date of Birth: 1951/03/16 CSN: [id number] Age: 68 Admit Type: Inpatient Procedure:                Colonoscopy Indications:              Abnormal CT of the GI tract, Follow-up of colitis Providers:                Kathi Der, MD, Clearnce Sorrel, RN, Matthew Folks, Technician Referring MD:              Medicines:                Sedation Administered by an Anesthesia Professional Complications:            No immediate complications. Estimated Blood Loss:     Estimated blood loss was minimal. Procedure:                Pre-Anesthesia Assessment:                           - Prior to the procedure, a History and Physical                            was performed, and patient medications and                            allergies were reviewed. The patient's tolerance of                            previous anesthesia was also reviewed. The risks                            and benefits of the procedure and the sedation                            options and risks were discussed with the patient.                            All questions were answered, and informed consent                            was obtained. Prior Anticoagulants: The patient has                            taken no previous anticoagulant or antiplatelet                            agents. ASA Grade Assessment: III - A patient with                            severe systemic disease. After reviewing the risks  and benefits, the patient was deemed in                            satisfactory condition to undergo the procedure.                           - Prior to the procedure, a History and Physical                            was performed, and patient medications and                            allergies were reviewed. The patient's tolerance  of                            previous anesthesia was also reviewed. The risks                            and benefits of the procedure and the sedation                            options and risks were discussed with the patient.                            All questions were answered, and informed consent                            was obtained. Prior Anticoagulants: The patient has                            taken no previous anticoagulant or antiplatelet                            agents. ASA Grade Assessment: III - A patient with                            severe systemic disease. After reviewing the risks                            and benefits, the patient was deemed in                            satisfactory condition to undergo the procedure.                           After obtaining informed consent, the colonoscope                            was passed under direct vision. Throughout the                            procedure, the patient's blood pressure, pulse, and  oxygen saturations were monitored continuously. The                            PCF-H190DL ([id number] Olympus pediatric colonscope                            was introduced through the anus with the intention                            of advancing to the cecum. The scope was advanced                            to the ascending colon before the procedure was                            aborted. Medications were given. The colonoscopy                            was technically difficult and complex due to                            significant looping. The patient tolerated the                            procedure well. The quality of the bowel                            preparation was fair. Scope In: Scope Out: Findings:      The perianal and digital rectal examinations were normal. The       colonoscopy was technically difficult and complex due to significant       looping. Given mucosal  changes of severe (possible) ischemic colitis,       further attempt to advance scope was not performed to minimize risk of       post procedure complication.The scope was advanced to the ascending       colon before the procedure was aborted.      Diffuse severe mucosal changes characterized by congestion (edema),       erosions, loss of vascularity, mucus, confluent ulcerations and deep       ulcerations were found in the proximal sigmoid colon and in the distal       transverse colon. Biopsies from severe colitis were not performed to       avoid complication      Diffuse moderate inflammation characterized by congestion (edema),       erosions and erythema was found in the distal sigmoid colon. Biopsies       were taken with a cold forceps for histology.      The retroflexed view of the distal rectum and anal verge was normal and       showed no anal or rectal abnormalities. Impression:               - Preparation of the colon was fair. The scope was                            advanced to  the ascending colon before the                            procedure was aborted.                           - Diffuse severe mucosal changes were found in the                            proximal sigmoid colon and in the distal transverse                            colon secondary to ischemic colitis.                           - Diffuse moderate inflammation was found in the                            distal sigmoid colon. Biopsied. Recommendation:           - Patient has a contact number available for                            emergencies. The signs and symptoms of potential                            delayed complications were discussed with the                            patient. Return to normal activities tomorrow.                            Written discharge instructions were provided to the                            patient.                           - Soft diet.                           -  Continue present medications.                           - Await pathology results. Procedure Code(s):        --- Professional ---                           [phone], 52, Colonoscopy, flexible; with biopsy,                            single or multiple Diagnosis Code(s):        --- Professional ---                           K55.9, Vascular disorder of intestine, unspecified  K52.9, Noninfective gastroenteritis and colitis,                            unspecified                           R93.3, Abnormal findings on diagnostic imaging of                            other parts of digestive tract CPT copyright 2019 American Medical Association. All rights reserved. The codes documented in this report are preliminary and upon coder review may  be revised to meet current compliance requirements. Kathi Der, MD Kathi Der, MD 04/01/2019 2:56:40 PM Number of Addenda: 0

## 2019-04-01 NOTE — Consult Note (Signed)
Pt has TL [facility], will accept all current medications, pt refuses [facility] attempted x2 for PIV but pt refuses this further as well and has adequate IV at this time

## 2019-04-01 NOTE — Progress Notes (Signed)
STROKE TEAM PROGRESS NOTE   INTERVAL HISTORY Pt sitting in chair, still has similar abdominal pain as yesterday. Pending EGD and colonoscopy.  Cardiology has seen the patient, had extensive chart and EKG review, no evidence of A. fib at this time but did have evidence of SVT, paroxysmal atrial tach and sinus tach with PACs.  Recommend no anticoagulants at this time but recommend event monitor or loop recorder.  Vitals:   03/31/19 1930 04/01/19 0005 04/01/19 0450 04/01/19 0754  BP: (!) 147/86 110/80  120/81  Pulse: (!) 105 96  99  Resp: 17 17  18   Temp: 97.9 F (36.6 C)   97.6 F (36.4 C)  TempSrc: Axillary   Oral  SpO2: 95% 99%  99%  Weight:   92.4 kg   Height:        CBC:  Recent Labs  Lab 03/29/19 0250 03/30/19 0418 04/01/19 0451  WBC 11.8* 14.4* 15.3*  NEUTROABS 9.0*  --  11.7*  HGB 10.3* 10.0* 10.6*  HCT 32.0* 30.5* 31.6*  MCV 92.5 90.5 88.5  PLT 109* 129* 253    Basic Metabolic Panel:  Recent Labs  Lab 03/26/19 0405  03/30/19 0418 04/01/19 0451  NA 140   < > 142 139  K 4.2   < > 3.4* 3.2*  CL 110   < > 102 102  CO2 20*   < > 30 25  GLUCOSE 93   < > 111* 103*  BUN 70*   < > 35* 25*  CREATININE 2.85*   < > 1.54* 1.57*  CALCIUM 6.5*   < > 7.8* 8.2*  MG 2.0  --   --  1.6*  PHOS 5.0*  --   --   --    < > = values in this interval not displayed.   Lipid Panel:     Component Value Date/Time   CHOL 64 04/01/2019 0451   TRIG 100 04/01/2019 0451   HDL 17 (L) 04/01/2019 0451   CHOLHDL 3.8 04/01/2019 0451   VLDL 20 04/01/2019 0451   LDLCALC 27 04/01/2019 0451   HgbA1c:  Lab Results  Component Value Date   HGBA1C 6.2 (H) 04/01/2019   Urine Drug Screen: No results found for: LABOPIA, COCAINSCRNUR, LABBENZ, AMPHETMU, THCU, LABBARB  Alcohol Level No results found for: [facility]  IMAGING Mr Maxine Glenn Head Wo Contrast  Result Date: 03/31/2019 CLINICAL DATA:  Admission with confusion and encephalopathy. 2 right hemispheric white matter infarctions identified yesterday.  EXAM: MRA NECK WITHOUT  CONTRAST MRA HEAD WITHOUT CONTRAST TECHNIQUE: Multiplanar and multiecho pulse sequences of the neck were obtained without and with intravenous contrast. Angiographic images of the neck were obtained using MRA technique without and with intravenous contast.; Angiographic images of the Circle of Willis were obtained using MRA technique without intravenous contrast. COMPARISON:  03/30/2019 FINDINGS: MRA NECK FINDINGS Brachiocephalic vessel origins are poorly seen using noncontrast technique. Both common carotid arteries are widely patent from the lower neck to the bifurcation region. Both carotid bifurcations appear widely patent without stenosis. Cervical internal carotid arteries appear normal. Antegrade flow is present in both vertebral arteries through the cervical region. The left is slightly larger than the right. No stenosis. MRA HEAD FINDINGS Both internal carotid arteries are patent through the skull base and siphon regions. No siphon stenosis. The anterior and middle cerebral vessels are patent without proximal stenosis, aneurysm or vascular malformation. No missing distal branch vessels are identified. Both vertebral arteries are patent through the foramen magnum with the  left being dominant. Both posterior inferior cerebellar arteries show flow. There is no basilar stenosis. Superior cerebellar and posterior cerebral arteries are patent. There may be some distal vessel atherosclerotic irregularity of the intracranial branch vessels, most severe in the left PCA territory where there is diminished distal vessel demonstration. IMPRESSION: Allowing for the technical limitations of a noncontrast MR angiogram, no neck vessel abnormality is seen. Both carotid bifurcations appear widely patent. No intracranial large or medium vessel occlusion or correctable proximal stenosis. Distal vessels show atherosclerotic irregularity, particularly in the left PCA territory with there is diminished  distal vessel demonstration. Electronically Signed   By: Paulina Fusi M.D.   On: 03/31/2019 08:45   Mr Maxine Glenn Neck Wo Contrast  Result Date: 03/31/2019 CLINICAL DATA:  Admission with confusion and encephalopathy. 2 right hemispheric white matter infarctions identified yesterday. EXAM: MRA NECK WITHOUT  CONTRAST MRA HEAD WITHOUT CONTRAST TECHNIQUE: Multiplanar and multiecho pulse sequences of the neck were obtained without and with intravenous contrast. Angiographic images of the neck were obtained using MRA technique without and with intravenous contast.; Angiographic images of the Circle of Willis were obtained using MRA technique without intravenous contrast. COMPARISON:  03/30/2019 FINDINGS: MRA NECK FINDINGS Brachiocephalic vessel origins are poorly seen using noncontrast technique. Both common carotid arteries are widely patent from the lower neck to the bifurcation region. Both carotid bifurcations appear widely patent without stenosis. Cervical internal carotid arteries appear normal. Antegrade flow is present in both vertebral arteries through the cervical region. The left is slightly larger than the right. No stenosis. MRA HEAD FINDINGS Both internal carotid arteries are patent through the skull base and siphon regions. No siphon stenosis. The anterior and middle cerebral vessels are patent without proximal stenosis, aneurysm or vascular malformation. No missing distal branch vessels are identified. Both vertebral arteries are patent through the foramen magnum with the left being dominant. Both posterior inferior cerebellar arteries show flow. There is no basilar stenosis. Superior cerebellar and posterior cerebral arteries are patent. There may be some distal vessel atherosclerotic irregularity of the intracranial branch vessels, most severe in the left PCA territory where there is diminished distal vessel demonstration. IMPRESSION: Allowing for the technical limitations of a noncontrast MR angiogram, no  neck vessel abnormality is seen. Both carotid bifurcations appear widely patent. No intracranial large or medium vessel occlusion or correctable proximal stenosis. Distal vessels show atherosclerotic irregularity, particularly in the left PCA territory with there is diminished distal vessel demonstration. Electronically Signed   By: Paulina Fusi M.D.   On: 03/31/2019 08:45   Mr Brain Wo Contrast  Result Date: 03/30/2019 CLINICAL DATA:  68 year old female with persistent encephalopathy. EXAM: MRI HEAD WITHOUT CONTRAST TECHNIQUE: Multiplanar, multiecho pulse sequences of the brain and surrounding structures were obtained without intravenous contrast. COMPARISON:  Head CT 03/29/2019. FINDINGS: Brain: Intermittent motion artifact. There is a small right posterior centrum semi of bowel focus of heterogeneous diffusion (series 3, image 36) which does appear restricted on series 300, image 36. There is a nearby chronic lacunar infarct (series 8, image 57) and generalized underlying bilateral cerebral white matter T2 and FLAIR hyperintensity. There is a 2nd punctate area of restricted diffusion in the right periatrial white matter best seen on series 9 image 10 and series 900, image 10. No associated hemorrhage or mass effect. No other restricted diffusion. Chronic lacunar infarct of the right corona radiata and basal ganglia. Chronic lacunar infarcts in both thalami with microhemorrhage. There is a small area of chronic microhemorrhage in the  right cerebellum on series 7, image 9. Probable chronic microhemorrhage in the right pons on image 13. No superimposed cortical encephalomalacia identified. No midline shift, mass effect, evidence of mass lesion, ventriculomegaly, extra-axial collection or acute intracranial hemorrhage. Cervicomedullary junction and pituitary are within normal limits. Vascular: Major intracranial vascular flow voids are preserved. Generalized intracranial artery tortuosity. Skull and upper cervical  spine: Negative visible cervical spine. Visualized bone marrow signal is within normal limits. Sinuses/Orbits: Grossly negative orbits. Paranasal Visualized paranasal sinuses and mastoids are stable and well pneumatized. Other: Scalp and face soft tissues appear negative. IMPRESSION: 1. Positive for two small acute on chronic cerebral white matter lacunar infarcts, both in the right hemisphere. No associated hemorrhage or mass effect. 2. Underlying severe chronic small vessel disease, including several chronic micro-hemorrhages. Electronically Signed   By: Odessa FlemingH  Hall M.D.   On: 03/30/2019 12:45   [place] Ekg Site Rite  Result Date: 03/30/2019 If Site Rite image not attached, placement could not be confirmed due to current cardiac rhythm.   PHYSICAL EXAM  Temp:  [97.6 F (36.4 C)-97.9 F (36.6 C)] 97.6 F (36.4 C) (06/05 0754) Pulse Rate:  [92-116] 99 (06/05 0754) Resp:  [17-21] 18 (06/05 0754) BP: (102-147)/(71-86) 120/81 (06/05 0754) SpO2:  [95 %-100 %] 99 % (06/05 0754) Weight:  [92.4 kg] 92.4 kg (06/05 0450)  General - Well nourished, well developed, mild abdominal pain.  Ophthalmologic - fundi not visualized due to noncooperation.  Cardiovascular - Regular rate and rhythm.  Mental Status -  Level of arousal and orientation to time, place, and person were intact. Language including expression, naming, repetition, comprehension was assessed and found intact. Fund of Knowledge was assessed and was intact.  Cranial Nerves II - XII - II - Visual field intact OU. III, IV, VI - Extraocular movements intact. V - Facial sensation intact bilaterally. VII - Facial movement intact bilaterally. VIII - Hearing & vestibular intact bilaterally. X - Palate elevates symmetrically. XI - Chin turning & shoulder shrug intact bilaterally. XII - Tongue protrusion intact.  Motor Strength - The patient's strength was symmetrical in all extremities and pronator drift was absent.  Bulk was normal and  fasciculations were absent.   Motor Tone - Muscle tone was assessed at the neck and appendages and was normal.  Reflexes - The patient's reflexes were symmetrical in all extremities and she had no pathological reflexes.  Sensory - Light touch, temperature/pinprick were assessed and were symmetrical.    Coordination - The patient had normal movements in the hands with no ataxia or dysmetria.  Tremor was absent.  Gait and Station - deferred.   ASSESSMENT/PLAN Ms. Fenton MallingBarbara J Graves is a 68 y.o. female with history of HTN, diverticulitis and arthritis presenting with nausea, black emesis and abd pain x 3 days. Found to be hypotensive with AKI, colitis. Developed delirium in hospital for which a MRI was done which showed new stroke. She had transient AF w/ RVR, new onset.   Incidental stroke:   2 small R periventriclar WM infarcts most likely d/t small vessel disease, but cannot rule out embolic source if PAF confirmed by cardiology  CT head age advanced small vessel disease, atrophy & ventriculomegaly. No acute finding.     MRI  Small infarcts at left peri-occipital horn and peri-frontal horn of lateral ventricle. Severe Small vessel disease. Several chronic microhemorrhages.  MRA head L PCA irregularity, o/w Unremarkable   MRA neck Unremarkable   2D Echo EF 50-55%. 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   Therapy recommendations:  SNF  Disposition:  pending   SVT, paroxysmal atrial tach and sinus tach with PACs  . Cardiology on board . Extensive EKG reviewed did not show A. Fib . On Plavix . No anticoagulation at this time . Recommend cardiac event monitoring vs loop recorder to rule out A. fib, defer to cardiology and primary team  GIB/acute colitis  Coffee ground emesis resolved  Acute blood loss anemia stable Hgb 10->10.6  Colitis on CT  abd/pelvis  Colonoscopy and EGD showed linear gastric ulcer without bleeding and severe ischemic colitis  Ok with GI to start antiplatelet, prefer Plavix (ordered)  On PPI  Hypotension Hx Hypertension  BP Stable . BP goal normotensive  Lipids  Home meds:  no statin  Put on lipitor 80 on admission  LDL 27 , goal < 70  Discontinue statin based on low LDL, not indicated  Other Stroke Risk Factors  Advanced age  ETOH use, advised to drink no more than 1 drink(s) a day  Obesity, Body mass index is 31.9 kg/m., recommend weight loss, diet and exercise as appropriate   Other Active Problems  Leukocytosis WBC 11.8->14.4->15.3  AKI Cr 2.02->1.54->1.57  RA  Thrombocytopenia, resolved PLT 129->253  Hospital day # 8  Neurology will sign off. Please call with questions. [facility]. Thanks for the consult.   Marvel PlanJindong Kenneisha Cochrane, MD PhD Stroke Neurology 04/01/2019 11:19 AM    To contact Stroke Continuity provider, please refer to [email or web address]. After hours, contact General Neurology

## 2019-04-01 NOTE — Care Management Important Message (Signed)
Important Message  Patient Details  Name: Michelle Graves MRN: [id number] Date of Birth: 1951-06-13   Medicare Important Message Given:       Renie Ora 04/01/2019, 2:29 PM

## 2019-04-01 NOTE — Progress Notes (Signed)
Patient ID: Michelle Graves, female   DOB: [birth year], 68 y.o.   MRN: [id number]  PROGRESS NOTE    Michelle Graves  WUJ:[id number] DOB: 1951-07-15 DOA: 03/24/2019 PCP: [facility]   Brief Narrative:  68 year old female with history of hypertension, CKD stage III, rheumatoid arthritis presented on 03/24/2019 with nausea, black emesis and abdominal pain for 3 days prior to presentation.  She was found to be hypotensive with acute kidney injury with creatinine of 5.  She had to be subsequently transferred to ICU for persistent hypotension and required pressors.  PCCM also was consulted.  Her blood pressure improved with fluid resuscitation and pressors.  CT scan of the abdomen showed diffuse colitis.  She was started on broad-spectrum antibiotics.  GI was consulted.  She became confused and agitated on 03/26/2019.  MRI of the brain on 03/30/2019 showed 2 small acute on chronic lacunar infarcts.  Neurology recommended transfer to [facility].  Assessment & Plan:   Ischemic stroke -MRI showed 2 small acute on chronic cerebral white matter lacunar infarcts, both in the right hemisphere -Neurology following. -Echo on 03/27/2019 showed normal left ventricular function with EF of 55 to 60%. -MRA of the head and neck did not show any significant stenosis. -Continue atorvastatin.  Start aspirin once okay with GI.  -Allow for permissive hypertension.   -Monitor mental status. -PT/OT recommend SNF.  Social worker consult. -Diet as per SLP recommendations.  Nausea/vomiting/coffee-ground emesis Acute colitis -CT showed transverse and descending colon colitis -GI following.  Hemoglobin 10.6 today.  No gross bleeding.  Suspect peptic ulcer disease.  She was on NSAIDs and prednisone at home. -Continue Protonix -Continue Zosyn for now.  GI planning for EGD and colonoscopy today. -C. difficile negative, GI panel negative.  Leukocytosis -From above.  Monitor.  Acute  kidney injury -Probably secondary to dehydration, ongoing NSAIDs and ACE inhibitor use. -Baseline creatinine from 1.2-1.6 -Much improved.  Creatinine back to baseline.  Ultrasound showed chronic renal disease, no hydronephrosis/obstruction  Hypotension/suspected hypovolemic shock History of hypertension -Patient had to be briefly transferred to ICU for pressors.  Blood pressure much improved after fluid resuscitation.  Antihypertensive plan as above.  Paroxysmal new onset A. fib with RVR versus SVT -Patient was treated with Cardizem drip briefly and subsequently converted to normal sinus rhythm.  She was switched to oral metoprolol. -Rapid A. fib was in the setting of sepsis so no anticoagulation was started.   -Echo showed normal left ventricular function with no new wall motion abnormalities.  Minimal troponin elevation secondary to supply demand ischemia. -Cardiology was also consulted given the need for anticoagulation if patient had A. fib and with new diagnosis of stroke.  Cardiology evaluation appreciated.  At this point, it is unclear if patient had SVT versus A. fib.  Will follow further recommendations.  Patient might need loop recorder.  Acute hypoxic respiratory failure -Most likely secondary to fluid overload after massive fluid resuscitation -Currently on 2 L oxygen via nasal cannula. -Patient was started on Lasix.  Earlier imaging had shown pleural effusion but PCCM did not recommend thoracentesis and it improved with diuresis.  Chest x-ray on 03/30/2019 showed mild basilar atelectasis and small left effusion.  Hypokalemia -Replace.  Hypomagnesemia  -replace.  Repeat a.m. labs.  Rheumatoid arthritis -On Plaquenil at home along with Enbrel every week.  Outpatient follow-up with rheumatology.  Recently completed a course of steroids.  Avoid NSAIDs.  Thrombocytopenia -Questionable cause.  Resolved.    DVT  prophylaxis: SCDs Code Status: DNR Family Communication: Spoke to  daughter Sheralyn Boatmanoni on phone on 03/31/2019 Disposition Plan: Will need SNF in 2 to 3 days once cleared by GI and cardiology.  Consultants: Gastroenterology/neurology/PCCM  Procedures:  Echo IMPRESSIONS    1. The left ventricle has normal systolic function, with an ejection fraction of 55-60%. The cavity size was normal. There is mild concentric left ventricular hypertrophy. Left ventricular diastolic Doppler parameters are consistent with impaired  relaxation. Indeterminate filling pressures.  2. The mitral valve is grossly normal. Mild thickening of the mitral valve leaflet. There is mild mitral annular calcification present. Mitral valve regurgitation is mild to moderate by color flow Doppler. The MR jet is eccentric posteriorly directed.  3. The tricuspid valve is grossly normal.  4. The aortic valve is tricuspid. Aortic valve regurgitation is trivial by color flow Doppler.  5. The aortic root is normal in size and structure.  6. There is mild dilatation of the ascending aorta measuring 39 mm.  7. The interatrial septum was not well visualized.  Antimicrobials:  Zosyn from 03/26/2019 onwards  Subjective: Patient seen and examined at bedside.  She is sleepy, wakes up slightly on calling her name.  Hardly answers any questions.  No overnight fever or vomiting reported.  Objective: Vitals:   03/31/19 1643 03/31/19 1930 04/01/19 0005 04/01/19 0450  BP: 110/81 (!) 147/86 110/80   Pulse: (!) 116 (!) 105 96   Resp: 17 17 17    Temp:  97.9 F (36.6 C)    TempSrc:  Axillary    SpO2: 100% 95% 99%   Weight:    92.4 kg  Height:       No intake or output data in the 24 hours ending 04/01/19 0724 Filed Weights   03/29/19 0353 03/30/19 0500 04/01/19 0450  Weight: 94.1 kg 92.9 kg 92.4 kg    Examination:  General exam: No distress.  Sleepy, wakes up only slightly. Respiratory system: Bilateral decreased breath sounds at bases, some scattered crackles.  No wheezing Cardiovascular system: S1 &  S2 heard, still has intermittent tachycardia Gastrointestinal system: Abdomen is nondistended, soft and mildly tender in the lower quadrant.  Normal bowel sounds heard. Extremities: No cyanosis; trace edema     Data Reviewed: I have personally reviewed following labs and imaging studies  CBC: Recent Labs  Lab 03/25/19 1616  03/27/19 0401 03/28/19 0358 03/29/19 0250 03/30/19 0418 04/01/19 0451  WBC 10.7*   < > 13.3* 11.7* 11.8* 14.4* 15.3*  NEUTROABS 9.1*  --  11.4* 9.2* 9.0*  --  11.7*  HGB 10.4*   < > 9.4* 10.3* 10.3* 10.0* 10.6*  HCT 32.5*   < > 29.5* 31.0* 32.0* 30.5* 31.6*  MCV 96.2   < > 95.5 93.4 92.5 90.5 88.5  PLT 137*   < > 110* 105* 109* 129* 253   < > = values in this interval not displayed.   Basic Metabolic Panel: Recent Labs  Lab 03/26/19 0405 03/27/19 0401 03/28/19 0358 03/29/19 0250 03/30/19 0418 04/01/19 0451  NA 140 142 142 141 142 139  K 4.2 3.4* 4.1 3.6 3.4* 3.2*  CL 110 108 103 101 102 102  CO2 20* 24 29 26 30 25   GLUCOSE 93 72 92 86 111* 103*  BUN 70* 52* 45* 34* 35* 25*  CREATININE 2.85* 2.16* 2.02* 1.59* 1.54* 1.57*  CALCIUM 6.5* 7.3* 8.7* 8.3* 7.8* 8.2*  MG 2.0  --   --   --   --  1.6*  PHOS 5.0*  --   --   --   --   --    GFR: Estimated Creatinine Clearance: 41.1 mL/min (A) (by C-G formula based on SCr of 1.57 mg/dL (H)). Liver Function Tests: Recent Labs  Lab 03/25/19 1616  AST 29  ALT 16  ALKPHOS 64  BILITOT 0.9  PROT 5.4*  ALBUMIN 2.4*   No results for input(s): LIPASE, AMYLASE in the last 168 hours. Recent Labs  Lab 03/25/19 1747 03/29/19 1446  AMMONIA 27 16   Coagulation Profile: No results for input(s): INR, PROTIME in the last 168 hours. Cardiac Enzymes: Recent Labs  Lab 03/26/19 0612 03/26/19 1238 03/27/19 0406  TROPONINI 0.03* <0.03 0.32*   BNP (last 3 results) No results for input(s): PROBNP in the last 8760 hours. HbA1C: Recent Labs    04/01/19 0451  HGBA1C 6.2*   CBG: No results for input(s):  GLUCAP in the last 168 hours. Lipid Profile: Recent Labs    04/01/19 0451  CHOL 64  HDL 17*  LDLCALC 27  TRIG 161100  CHOLHDL 3.8   Thyroid Function Tests: No results for input(s): TSH, T4TOTAL, FREET4, T3FREE, THYROIDAB in the last 72 hours. Anemia Panel: No results for input(s): VITAMINB12, FOLATE, FERRITIN, TIBC, IRON, RETICCTPCT in the last 72 hours. Sepsis Labs: Recent Labs  Lab 03/25/19 1616  LATICACIDVEN 1.8    Recent Results (from the past 240 hour(s))  SARS Coronavirus 2 (CEPHEID - Performed in [facility] lab), Hosp Order     Status: None   Collection Time: 03/24/19  1:13 PM  Result Value Ref Range Status   SARS Coronavirus 2 NEGATIVE NEGATIVE Final    Comment: (NOTE) If result is NEGATIVE SARS-CoV-2 target nucleic acids are NOT DETECTED. The SARS-CoV-2 RNA is generally detectable in upper and lower  respiratory specimens during the acute phase of infection. The lowest  concentration of SARS-CoV-2 viral copies this assay can detect is 250  copies / mL. A negative result does not preclude SARS-CoV-2 infection  and should not be used as the sole basis for treatment or other  patient management decisions.  A negative result may occur with  improper specimen collection / handling, submission of specimen other  than nasopharyngeal swab, presence of viral mutation(s) within the  areas targeted by this assay, and inadequate number of viral copies  (<250 copies / mL). A negative result must be combined with clinical  observations, patient history, and epidemiological information. If result is POSITIVE SARS-CoV-2 target nucleic acids are DETECTED. The SARS-CoV-2 RNA is generally detectable in upper and lower  respiratory specimens dur ing the acute phase of infection.  Positive  results are indicative of active infection with SARS-CoV-2.  Clinical  correlation with patient history and other diagnostic information is  necessary to determine patient infection  status.  Positive results do  not rule out bacterial infection or co-infection with other viruses. If result is PRESUMPTIVE POSTIVE SARS-CoV-2 nucleic acids MAY BE PRESENT.   A presumptive positive result was obtained on the submitted specimen  and confirmed on repeat testing.  While 2019 novel coronavirus  (SARS-CoV-2) nucleic acids may be present in the submitted sample  additional confirmatory testing may be necessary for epidemiological  and / or clinical management purposes  to differentiate between  SARS-CoV-2 and other Sarbecovirus currently known to infect humans.  If clinically indicated additional testing with an alternate test  methodology [phone]([id number]) is advised. The SARS-CoV-2 RNA is generally  detectable in upper and lower respiratory  sp ecimens during the acute  phase of infection. The expected result is Negative. Fact Sheet for Patients:  BoilerBrush.com.cy Fact Sheet for Healthcare Providers: https://pope.com/ This test is not yet approved or cleared by the [place] FDA and has been authorized for detection and/or diagnosis of SARS-CoV-2 by FDA under an Emergency Use Authorization (EUA).  This EUA will remain in effect (meaning this test can be used) for the duration of the COVID-19 declaration under Section 564(b)(1) of the Act, 21 U.S.C. section 360bbb-3(b)(1), unless the authorization is terminated or revoked sooner. Performed at [facility], [address], [place], [place] [address]   MRSA PCR Screening     Status: None   Collection Time: 03/24/19  8:50 PM  Result Value Ref Range Status   MRSA by PCR NEGATIVE NEGATIVE Final    Comment:        The GeneXpert MRSA Assay (FDA approved for NASAL specimens only), is one component of a comprehensive MRSA colonization surveillance program. It is not intended to diagnose MRSA infection nor to guide or monitor treatment for MRSA  infections. Performed at [facility], [address] [place], [place] [address]   Culture, blood (routine x 2)     Status: None   Collection Time: 03/25/19  6:40 PM  Result Value Ref Range Status   Specimen Description   Final    BLOOD CENTRAL LINE Performed at [facility], [address], [place], [place] [address]    Special Requests   Final    BOTTLES DRAWN AEROBIC AND ANAEROBIC Blood Culture adequate volume Performed at [facility], [address], [place], [place] [address]    Culture   Final    NO GROWTH 5 DAYS Performed at [facility], [address], [place], [place] [address]    Report Status 03/30/2019 FINAL  Final  Culture, blood (routine x 2)     Status: None   Collection Time: 03/25/19  6:54 PM  Result Value Ref Range Status   Specimen Description   Final    BLOOD ARTERIAL Performed at [facility], [address], [place], [place] [address]    Special Requests   Final    BOTTLES DRAWN AEROBIC AND ANAEROBIC Blood Culture adequate volume Performed at [facility], [address], [place], [place] [address]    Culture   Final    NO GROWTH 5 DAYS Performed at [facility], [address] [place], [place] [address]    Report Status 03/30/2019 FINAL  Final  C difficile quick scan w PCR reflex     Status: None   Collection Time: 03/26/19  8:21 AM  Result Value Ref Range Status   C Diff antigen NEGATIVE NEGATIVE Final   C Diff toxin NEGATIVE NEGATIVE Final   C Diff interpretation No C. difficile detected.  Final    Comment: Performed at [facility], [address] [place], [place] [address]  Gastrointestinal Panel by PCR , Stool     Status: None   Collection Time: 03/26/19 12:00 PM  Result Value Ref Range Status   Campylobacter species NOT DETECTED NOT DETECTED Final   Plesimonas shigelloides NOT DETECTED NOT DETECTED Final    Salmonella species NOT DETECTED NOT DETECTED Final   Yersinia enterocolitica NOT DETECTED NOT DETECTED Final   Vibrio species NOT DETECTED NOT DETECTED Final   Vibrio cholerae NOT DETECTED NOT DETECTED Final  Enteroaggregative E coli (EAEC) NOT DETECTED NOT DETECTED Final   Enteropathogenic E coli (EPEC) NOT DETECTED NOT DETECTED Final   Enterotoxigenic E coli (ETEC) NOT DETECTED NOT DETECTED Final   Shiga like toxin producing E coli (STEC) NOT DETECTED NOT DETECTED Final   Shigella/Enteroinvasive E coli (EIEC) NOT DETECTED NOT DETECTED Final   Cryptosporidium NOT DETECTED NOT DETECTED Final   Cyclospora cayetanensis NOT DETECTED NOT DETECTED Final   Entamoeba histolytica NOT DETECTED NOT DETECTED Final   Giardia lamblia NOT DETECTED NOT DETECTED Final   Adenovirus F40/41 NOT DETECTED NOT DETECTED Final   Astrovirus NOT DETECTED NOT DETECTED Final   Norovirus GI/GII NOT DETECTED NOT DETECTED Final   Rotavirus A NOT DETECTED NOT DETECTED Final   Sapovirus (I, II, IV, and V) NOT DETECTED NOT DETECTED Final    Comment: Performed at [facility], [address], [place], [place] [address]         Radiology Studies: Mr Shirlee Latch WU Contrast  Result Date: 03/31/2019 CLINICAL DATA:  Admission with confusion and encephalopathy. 2 right hemispheric white matter infarctions identified yesterday. EXAM: MRA NECK WITHOUT  CONTRAST MRA HEAD WITHOUT CONTRAST TECHNIQUE: Multiplanar and multiecho pulse sequences of the neck were obtained without and with intravenous contrast. Angiographic images of the neck were obtained using MRA technique without and with intravenous contast.; Angiographic images of the Circle of Willis were obtained using MRA technique without intravenous contrast. COMPARISON:  03/30/2019 FINDINGS: MRA NECK FINDINGS Brachiocephalic vessel origins are poorly seen using noncontrast technique. Both common carotid arteries are widely patent from the lower neck to the bifurcation  region. Both carotid bifurcations appear widely patent without stenosis. Cervical internal carotid arteries appear normal. Antegrade flow is present in both vertebral arteries through the cervical region. The left is slightly larger than the right. No stenosis. MRA HEAD FINDINGS Both internal carotid arteries are patent through the skull base and siphon regions. No siphon stenosis. The anterior and middle cerebral vessels are patent without proximal stenosis, aneurysm or vascular malformation. No missing distal branch vessels are identified. Both vertebral arteries are patent through the foramen magnum with the left being dominant. Both posterior inferior cerebellar arteries show flow. There is no basilar stenosis. Superior cerebellar and posterior cerebral arteries are patent. There may be some distal vessel atherosclerotic irregularity of the intracranial branch vessels, most severe in the left PCA territory where there is diminished distal vessel demonstration. IMPRESSION: Allowing for the technical limitations of a noncontrast MR angiogram, no neck vessel abnormality is seen. Both carotid bifurcations appear widely patent. No intracranial large or medium vessel occlusion or correctable proximal stenosis. Distal vessels show atherosclerotic irregularity, particularly in the left PCA territory with there is diminished distal vessel demonstration. Electronically Signed   By: Paulina Fusi M.D.   On: 03/31/2019 08:45   Mr Maxine Glenn Neck Wo Contrast  Result Date: 03/31/2019 CLINICAL DATA:  Admission with confusion and encephalopathy. 2 right hemispheric white matter infarctions identified yesterday. EXAM: MRA NECK WITHOUT  CONTRAST MRA HEAD WITHOUT CONTRAST TECHNIQUE: Multiplanar and multiecho pulse sequences of the neck were obtained without and with intravenous contrast. Angiographic images of the neck were obtained using MRA technique without and with intravenous contast.; Angiographic images of the Circle of Willis  were obtained using MRA technique without intravenous contrast. COMPARISON:  03/30/2019 FINDINGS: MRA NECK FINDINGS Brachiocephalic vessel origins are poorly seen using noncontrast technique. Both common carotid arteries are widely patent from the lower neck to the bifurcation region. Both carotid bifurcations  appear widely patent without stenosis. Cervical internal carotid arteries appear normal. Antegrade flow is present in both vertebral arteries through the cervical region. The left is slightly larger than the right. No stenosis. MRA HEAD FINDINGS Both internal carotid arteries are patent through the skull base and siphon regions. No siphon stenosis. The anterior and middle cerebral vessels are patent without proximal stenosis, aneurysm or vascular malformation. No missing distal branch vessels are identified. Both vertebral arteries are patent through the foramen magnum with the left being dominant. Both posterior inferior cerebellar arteries show flow. There is no basilar stenosis. Superior cerebellar and posterior cerebral arteries are patent. There may be some distal vessel atherosclerotic irregularity of the intracranial branch vessels, most severe in the left PCA territory where there is diminished distal vessel demonstration. IMPRESSION: Allowing for the technical limitations of a noncontrast MR angiogram, no neck vessel abnormality is seen. Both carotid bifurcations appear widely patent. No intracranial large or medium vessel occlusion or correctable proximal stenosis. Distal vessels show atherosclerotic irregularity, particularly in the left PCA territory with there is diminished distal vessel demonstration. Electronically Signed   By: Paulina FusiMark  Shogry M.D.   On: 03/31/2019 08:45   Mr Brain Wo Contrast  Result Date: 03/30/2019 CLINICAL DATA:  68 year old female with persistent encephalopathy. EXAM: MRI HEAD WITHOUT CONTRAST TECHNIQUE: Multiplanar, multiecho pulse sequences of the brain and surrounding  structures were obtained without intravenous contrast. COMPARISON:  Head CT 03/29/2019. FINDINGS: Brain: Intermittent motion artifact. There is a small right posterior centrum semi of bowel focus of heterogeneous diffusion (series 3, image 36) which does appear restricted on series 300, image 36. There is a nearby chronic lacunar infarct (series 8, image 57) and generalized underlying bilateral cerebral white matter T2 and FLAIR hyperintensity. There is a 2nd punctate area of restricted diffusion in the right periatrial white matter best seen on series 9 image 10 and series 900, image 10. No associated hemorrhage or mass effect. No other restricted diffusion. Chronic lacunar infarct of the right corona radiata and basal ganglia. Chronic lacunar infarcts in both thalami with microhemorrhage. There is a small area of chronic microhemorrhage in the right cerebellum on series 7, image 9. Probable chronic microhemorrhage in the right pons on image 13. No superimposed cortical encephalomalacia identified. No midline shift, mass effect, evidence of mass lesion, ventriculomegaly, extra-axial collection or acute intracranial hemorrhage. Cervicomedullary junction and pituitary are within normal limits. Vascular: Major intracranial vascular flow voids are preserved. Generalized intracranial artery tortuosity. Skull and upper cervical spine: Negative visible cervical spine. Visualized bone marrow signal is within normal limits. Sinuses/Orbits: Grossly negative orbits. Paranasal Visualized paranasal sinuses and mastoids are stable and well pneumatized. Other: Scalp and face soft tissues appear negative. IMPRESSION: 1. Positive for two small acute on chronic cerebral white matter lacunar infarcts, both in the right hemisphere. No associated hemorrhage or mass effect. 2. Underlying severe chronic small vessel disease, including several chronic micro-hemorrhages. Electronically Signed   By: Odessa FlemingH  Hall M.D.   On: 03/30/2019 12:45    [place] Ekg Site Rite  Result Date: 03/30/2019 If Site Rite image not attached, placement could not be confirmed due to current cardiac rhythm.       Scheduled Meds: . atorvastatin  80 mg Oral q1800  . Chlorhexidine Gluconate Cloth  6 each Topical Daily  . colestipol  1 g Oral BID  . furosemide  40 mg Oral Daily  . hydroxychloroquine  200 mg Oral BID  . mouth rinse  15 mL Mouth Rinse BID  .  metoprolol tartrate  100 mg Oral BID  . pantoprazole  40 mg Oral BID  . pneumococcal 23 valent vaccine  0.5 mL Intramuscular Tomorrow-1000  . sodium chloride flush  10-40 mL Intracatheter Q12H  . sodium chloride flush  10-40 mL Intracatheter Q12H   Continuous Infusions: . sodium chloride 10 mL/hr at 03/31/19 0028  . piperacillin-tazobactam (ZOSYN)  IV 3.375 g (04/01/19 0018)     LOS: 8 days        Glade Lloyd, MD [facility] 04/01/2019, 7:24 AM

## 2019-04-01 NOTE — Progress Notes (Signed)
Physical Therapy Treatment Patient Details Name: Michelle MallingBarbara J Graves MRN: [id number] DOB: 02/23/1951 Today's Date: 04/01/2019    History of Present Illness 68 yo female admitted with acute kidney injury, hypovolemic shock, A fib with RVR, N/V with coffee ground emesis. Hx of CKD, RA, obesity, Sjogren's, chronic pain. Pt developed delirium 5/30. [facility]. MRI 6/3-positive for 2 small acute on chronic cerebral white matter lacunar infarcts, both in the right hemisphere.  No associated hemorrhage or mass-effect    PT Comments    Patient seen for mobility progression. Pt requires mod A +2 for bed mobility and functional transfer training this session. Pt continues to present with generalized weakness and decreased activity tolerance. Continue to progress as tolerated with anticipated d/c to SNF for further skilled PT services.      Follow Up Recommendations  SNF     Equipment Recommendations  None recommended by PT    Recommendations for Other Services       Precautions / Restrictions Precautions Precautions: Fall Restrictions Weight Bearing Restrictions: No    Mobility  Bed Mobility Overal bed mobility: Needs Assistance Bed Mobility: Supine to Sit     Supine to sit: Mod assist;+2 for physical assistance     General bed mobility comments: assist to bring bilat LE and hips to EOB and to elevate trunk into sitting; cues for sequencing   Transfers Overall transfer level: Needs assistance Equipment used: Rolling walker (2 wheeled);1 person hand held assist Transfers: Sit to/from [employer]Stand;Stand Pivot Transfers Sit to Stand: Mod assist Stand pivot transfers: Mod assist;+2 safety/equipment       General transfer comment: assist to power up into standing and for balance and managing RW when pivoting to recliner; pt stood X 2 from EOB  Ambulation/Gait             General Gait Details: unable to progress to gait training due to pt fatigue   Stairs              Wheelchair Mobility    Modified Rankin (Stroke Patients Only)       Balance Overall balance assessment: Needs assistance Sitting-balance support: No upper extremity supported;Feet supported Sitting balance-[name]: Fair     Standing balance support: Bilateral upper extremity supported;During functional activity Standing balance-[name]: Poor                              Cognition Arousal/Alertness: Awake/alert Behavior During Therapy: Flat affect Overall Cognitive Status: Within Functional Limits for tasks assessed                                 General Comments: appears a little confused this session and talking about things not related to the situation      Exercises      General Comments General comments (skin integrity, edema, etc.): rectal tube leaking upon standing and required total A for hygiene in standing      Pertinent Vitals/Pain Pain Assessment: Faces Faces Pain Scale: Hurts little more Pain Location: abdomen Pain Descriptors / Indicators: Aching;Guarding;Grimacing Pain Intervention(s): Limited activity within patient's tolerance;Monitored during session;Repositioned    Home Living                      Prior Function            PT Goals (current goals can  now be found in the care plan section) Progress towards PT goals: Not progressing toward goals - comment(pt limited by fatigue)    Frequency    Min 3X/week      PT Plan Current plan remains appropriate    Co-evaluation              AM-PAC PT "6 Clicks" Mobility   Outcome Measure  Help needed turning from your back to your side while in a flat bed without using bedrails?: A Little Help needed moving from lying on your back to sitting on the side of a flat bed without using bedrails?: A Lot Help needed moving to and from a bed to a chair (including a wheelchair)?: A Little Help needed standing up from a chair using your arms  (e.g., wheelchair or bedside chair)?: A Little Help needed to walk in hospital room?: A Lot Help needed climbing 3-5 steps with a railing? : A Lot 6 Click Score: 15    End of Session Equipment Utilized During Treatment: Gait belt;Oxygen Activity Tolerance: Patient tolerated treatment well Patient left: in chair;with call bell/phone within reach;with chair alarm set Nurse Communication: Mobility status PT Visit Diagnosis: Muscle weakness (generalized) (M62.81);Difficulty in walking, not elsewhere classified (R26.2)     Time: 1324-4010 PT Time Calculation (min) (ACUTE ONLY): 34 min  Charges:  $Gait Training: 8-22 mins $Therapeutic Activity: 8-22 mins                     Erline Levine, PTA Acute Rehabilitation Services Pager: [phone] Office: [phone]     Carolynne Edouard 04/01/2019, 4:13 PM

## 2019-04-02 LAB — BASIC METABOLIC PANEL
Anion gap: 13 (ref 5–15)
BUN: 23 mg/dL (ref 8–23)
CO2: 24 mmol/L (ref 22–32)
Calcium: 8.4 mg/dL — ABNORMAL LOW (ref 8.9–10.3)
Chloride: 101 mmol/L (ref 98–111)
Creatinine, Ser: 1.41 mg/dL — ABNORMAL HIGH (ref 0.44–1.00)
GFR calc Af Amer: 45 mL/min — ABNORMAL LOW (ref >60)
GFR calc non Af Amer: 39 mL/min — ABNORMAL LOW (ref >60)
Glucose, Bld: 92 mg/dL (ref 70–99)
Potassium: 3.5 mmol/L (ref 3.5–5.1)
Sodium: 138 mmol/L (ref 135–145)

## 2019-04-02 LAB — CBC WITH DIFFERENTIAL/PLATELET
Abs Immature Granulocytes: 0.29 10*3/uL — ABNORMAL HIGH (ref 0.00–0.07)
Basophils Absolute: 0 10*3/uL (ref 0.0–0.1)
Basophils Relative: 0 %
Eosinophils Absolute: 0.3 10*3/uL (ref 0.0–0.5)
Eosinophils Relative: 2 %
HCT: 30.8 % — ABNORMAL LOW (ref 36.0–46.0)
Hemoglobin: 10.2 g/dL — ABNORMAL LOW (ref 12.0–15.0)
Immature Granulocytes: 3 %
Lymphocytes Relative: 14 %
Lymphs Abs: 1.6 10*3/uL (ref 0.7–4.0)
MCH: 29.7 pg (ref 26.0–34.0)
MCHC: 33.1 g/dL (ref 30.0–36.0)
MCV: 89.8 fL (ref 80.0–100.0)
Monocytes Absolute: 1.2 10*3/uL — ABNORMAL HIGH (ref 0.1–1.0)
Monocytes Relative: 10 %
Neutro Abs: 8.2 10*3/uL — ABNORMAL HIGH (ref 1.7–7.7)
Neutrophils Relative %: 71 %
Platelets: 279 10*3/uL (ref 150–400)
RBC: 3.43 MIL/uL — ABNORMAL LOW (ref 3.87–5.11)
RDW: 14.5 % (ref 11.5–15.5)
WBC: 11.5 10*3/uL — ABNORMAL HIGH (ref 4.0–10.5)
nRBC: 0 % (ref 0.0–0.2)

## 2019-04-02 LAB — MAGNESIUM: Magnesium: 2.1 mg/dL (ref 1.7–2.4)

## 2019-04-02 MED ORDER — TRAMADOL HCL 50 MG PO TABS: 50.0000 mg | ORAL_TABLET | Freq: Four times a day (QID) | ORAL | Status: DC | PRN

## 2019-04-02 MED ORDER — AMOXICILLIN-POT CLAVULANATE 875-125 MG PO TABS
1.0000 | ORAL_TABLET | Freq: Two times a day (BID) | ORAL | Status: DC
  Administered 2019-04-02 – 2019-04-04 (×5): 1 via ORAL
  Filled 2019-04-02 (×5): qty 1

## 2019-04-02 NOTE — Progress Notes (Signed)
   Progress Note  Patient Name: Michelle Graves Date of Encounter: 04/02/2019  Primary Cardiologist:  Osborne Oman  I reviewed the chart and recommendations made by Dr. Angelena Form.  Patient underwent GI evaluation on June 5 including EGD which showed gastric ulcers without active bleeding and colonoscopy demonstrating severe possible ischemic colitis with areas of hemorrhage in the distal sigmoid colon.  It was recommended that anticoagulation not be pursued for at least 3 to 5 days although antiplatelet agents could be used.  As noted by Dr. Angelena Form, there has been no definitive evidence of atrial fibrillation based on review of available strips.  Patient had SVT and paroxysmal atrial tachycardia as well as sinus tachycardia with PACs.  In this case would not necessarily pursue long-term anticoagulation without further investigating rhythm as an outpatient.  Suggest follow-up with [facility] cardiologist for consideration of an event recorder.  We will sign off.  Signed, Rozann Lesches, MD  04/02/2019, 9:19 AM

## 2019-04-02 NOTE — Progress Notes (Signed)
Patient ID: Michelle Graves, female   DOB: 08/18/1951, 68 y.o.   MRN: [id number]  PROGRESS NOTE    Michelle Graves  [id number] DOB: 06/14/1951 DOA: 03/24/2019 [facility]: [facility]   Brief Narrative:  68 year old female with history of hypertension, CKD stage III, rheumatoid arthritis presented on 03/24/2019 with nausea, black emesis and abdominal pain for 3 days prior to presentation.  She was found to be hypotensive with acute kidney injury with creatinine of 5.  She had to be subsequently transferred to ICU for persistent hypotension and required pressors.  PCCM also was consulted.  Her blood pressure improved with fluid resuscitation and pressors.  CT scan of the abdomen showed diffuse colitis.  She was started on broad-spectrum antibiotics.  GI was consulted.  She became confused and agitated on 03/26/2019.  MRI of the brain on 03/30/2019 showed 2 small acute on chronic lacunar infarcts.  [facility]rology [facility]  Assessment & Plan:   Ischemic stroke -MRI showed 2 small acute on chronic cerebral white matter lacunar infarcts, both in the right hemisphere -Neurology following. -Echo on 03/27/2019 showed normal left ventricular function with EF of 55 to 60%. -MRA of the head and neck did not show any significant stenosis. -Neurology has started the patient on Plavix as GI preferred Plavix over aspirin.  Statin has been discontinued as LDL was 27, goal is less than 70. -Neuro is recommending cardiac event monitoring versus loop recorder but has deferred to cardiology team.  Neurology has signed off.  Outpatient follow-up with neurology. -Monitor mental status. -PT/OT recommend SNF.  Social worker consult. -Diet as per SLP recommendations.  Nausea/vomiting/coffee-ground emesis Acute colitis -CT showed transverse and descending colon colitis -GI following.  Hemoglobin 10.6 today.  No gross bleeding.  Suspect peptic ulcer disease.  She  was on NSAIDs and prednisone at home. -Continue Protonix -Status post status post EGD on 04/01/2019 which showed linear gastric ulcers without any bleeding status post EGD on 04/01/2019 which showed linear gastric ulcers without any bleeding.  Colonoscopy showed severe possible ischemic colitis extending from distal transverse colon to proximal sigmoid colon.  There were some areas of hemorrhagic changes in the distal sigmoid colon.  GI was okay for the patient to be started preferably on Plavix as aspirin is ulcerogenic and to hold off on any anticoagulation if needed for 3 to 5 days since the procedure.  GI has signed off.  Outpatient follow-up with GI. -Currently on Zosyn.  We will switch to Augmentin. --C. difficile negative, GI panel negative.  Leukocytosis -From above.  Improving.  Monitor.  Acute kidney injury -Probably secondary to dehydration, ongoing NSAIDs and ACE inhibitor use. -Baseline creatinine from 1.2-1.6 -Much improved.  Creatinine back to baseline.  Ultrasound showed chronic renal disease, no hydronephrosis/obstruction  Hypotension/suspected hypovolemic shock History of hypertension -Patient had to be briefly transferred to ICU for pressors.  Blood pressure much improved after fluid resuscitation. Continue metoprolol and lasix.  Paroxysmal new onset A. fib with RVR versus SVT -Patient was treated with Cardizem drip briefly and subsequently converted to normal sinus rhythm.  She was switched to oral metoprolol. -Rapid A. fib was in the setting of sepsis so no anticoagulation was started.   -Echo showed normal left ventricular function with no new wall motion abnormalities.  Minimal troponin elevation secondary to supply demand ischemia. - Cardiology following.  Cardiology thinks that she did have SVT, proximal atrial tachycardia and sinus tachycardia with PACs.  She  might not need anticoagulation.  Will follow further recommendations.  Patient might need event monitor post  discharge.  Acute hypoxic respiratory failure -Most likely secondary to fluid overload after massive fluid resuscitation -Much improved.  Currently on room air. -Patient was started on Lasix.  Earlier imaging had shown pleural effusion but PCCM did not recommend thoracentesis and it improved with diuresis.  Chest x-ray on 03/30/2019 showed mild basilar atelectasis and small left effusion.  Hypokalemia -Improved  Hypomagnesemia  -Improved.  Rheumatoid arthritis -On Plaquenil at home along with Enbrel every week.  Outpatient follow-up with rheumatology.  Recently completed a course of steroids.  Avoid NSAIDs.  Thrombocytopenia -Questionable cause.  Resolved.    DVT prophylaxis: SCDs Code Status: DNR Family Communication: Spoke to daughter [name] on phone on 03/31/2019 and on 04/02/2019 Disposition Plan: SNF once bed is available.  Patient is currently refusing SNF.  Updated the same to daughter [name] on phone.  Consultants: Gastroenterology/neurology/PCCM  Procedures:  Echo IMPRESSIONS    1. The left ventricle has normal systolic function, with an ejection fraction of 55-60%. The cavity size was normal. There is mild concentric left ventricular hypertrophy. Left ventricular diastolic Doppler parameters are consistent with impaired  relaxation. Indeterminate filling pressures.  2. The mitral valve is grossly normal. Mild thickening of the mitral valve leaflet. There is mild mitral annular calcification present. Mitral valve regurgitation is mild to moderate by color flow Doppler. The MR jet is eccentric posteriorly directed.  3. The tricuspid valve is grossly normal.  4. The aortic valve is tricuspid. Aortic valve regurgitation is trivial by color flow Doppler.  5. The aortic root is normal in size and structure.  6. There is mild dilatation of the ascending aorta measuring 39 mm.  7. The interatrial septum was not well visualized.  Antimicrobials:  Zosyn from 03/26/2019  onwards  Subjective: Patient seen and examined at bedside.  She is awake.  Poor historian.  Keeps saying that she wants to go home.  No overnight fever or vomiting reported.    Objective: Vitals:   04/01/19 2128 04/02/19 0040 04/02/19 0521 04/02/19 0800  BP: 123/75 136/80 132/82 129/88  Pulse: (!) 106 98 74 84  Resp: 20 20  15   Temp: 98.4 F (36.9 C) 98.7 F (37.1 C) 97.6 F (36.4 C) 98 F (36.7 C)  TempSrc: Oral Oral Oral Oral  SpO2: 97% 99% 100% 100%  Weight:      Height:       No intake or output data in the 24 hours ending 04/02/19 0915 Filed Weights   03/30/19 0500 04/01/19 0450 04/01/19 1322  Weight: 92.9 kg 92.4 kg 92.4 kg    Examination:  General exam: No distress. More awake and answering some more questions.  Poor historian. Respiratory system: Bilateral decreased breath sounds at bases, some scattered crackles.   Cardiovascular system: S1 & S2 heard, currently rate controlled. Gastrointestinal system: Abdomen is nondistended, soft and mildly tender in the lower quadrant.  Normal bowel sounds heard. Extremities: No cyanosis; trace edema     Data Reviewed: I have personally reviewed following labs and imaging studies  CBC: Recent Labs  Lab 03/27/19 0401 03/28/19 0358 03/29/19 0250 03/30/19 0418 04/01/19 0451 04/02/19 0330  WBC 13.3* 11.7* 11.8* 14.4* 15.3* 11.5*  NEUTROABS 11.4* 9.2* 9.0*  --  11.7* 8.2*  HGB 9.4* 10.3* 10.3* 10.0* 10.6* 10.2*  HCT 29.5* 31.0* 32.0* 30.5* 31.6* 30.8*  MCV 95.5 93.4 92.5 90.5 88.5 89.8  PLT 110* 105* 109* 129*  253 279   Basic Metabolic Panel: Recent Labs  Lab 03/28/19 0358 03/29/19 0250 03/30/19 0418 04/01/19 0451 04/02/19 0330  NA 142 141 142 139 138  K 4.1 3.6 3.4* 3.2* 3.5  CL 103 101 102 102 101  CO2 29 26 30 25 24   GLUCOSE 92 86 111* 103* 92  BUN 45* 34* 35* 25* 23  CREATININE 2.02* 1.59* 1.54* 1.57* 1.41*  CALCIUM 8.7* 8.3* 7.8* 8.2* 8.4*  MG  --   --   --  1.6* 2.1   GFR: Estimated Creatinine  Clearance: 45.8 mL/min (A) (by C-G formula based on SCr of 1.41 mg/dL (H)). Liver Function Tests: No results for input(s): AST, ALT, ALKPHOS, BILITOT, PROT, ALBUMIN in the last 168 hours. No results for input(s): LIPASE, AMYLASE in the last 168 hours. Recent Labs  Lab 03/29/19 1446  AMMONIA 16   Coagulation Profile: No results for input(s): INR, PROTIME in the last 168 hours. Cardiac Enzymes: Recent Labs  Lab 03/26/19 1238 03/27/19 0406  TROPONINI <0.03 0.32*   BNP (last 3 results) No results for input(s): PROBNP in the last 8760 hours. HbA1C: Recent Labs    04/01/19 0451  HGBA1C 6.2*   CBG: No results for input(s): GLUCAP in the last 168 hours. Lipid Profile: Recent Labs    04/01/19 0451  CHOL 64  HDL 17*  LDLCALC 27  TRIG 440  CHOLHDL 3.8   Thyroid Function Tests: No results for input(s): TSH, T4TOTAL, FREET4, T3FREE, THYROIDAB in the last 72 hours. Anemia Panel: No results for input(s): VITAMINB12, FOLATE, FERRITIN, TIBC, IRON, RETICCTPCT in the last 72 hours. Sepsis Labs: No results for input(s): PROCALCITON, LATICACIDVEN in the last 168 hours.  Recent Results (from the past 240 hour(s))  SARS Coronavirus 2 (CEPHEID - Performed in [facility] hospital lab), Hosp Order     Status: None   Collection Time: 03/24/19  1:13 PM  Result Value Ref Range Status   SARS Coronavirus 2 NEGATIVE NEGATIVE Final    Comment: (NOTE) If result is NEGATIVE SARS-CoV-2 target nucleic acids are NOT DETECTED. The SARS-CoV-2 RNA is generally detectable in upper and lower  respiratory specimens during the acute phase of infection. The lowest  concentration of SARS-CoV-2 viral copies this assay can detect is 250  copies / mL. A negative result does not preclude SARS-CoV-2 infection  and should not be used as the sole basis for treatment or other  patient management decisions.  A negative result may occur with  improper specimen collection / handling, submission of specimen other   than nasopharyngeal swab, presence of viral mutation(s) within the  areas targeted by this assay, and inadequate number of viral copies  (<250 copies / mL). A negative result must be combined with clinical  observations, patient history, and epidemiological information. If result is POSITIVE SARS-CoV-2 target nucleic acids are DETECTED. The SARS-CoV-2 RNA is generally detectable in upper and lower  respiratory specimens dur ing the acute phase of infection.  Positive  results are indicative of active infection with SARS-CoV-2.  Clinical  correlation with patient history and other diagnostic information is  necessary to determine patient infection status.  Positive results do  not rule out bacterial infection or co-infection with other viruses. If result is PRESUMPTIVE POSTIVE SARS-CoV-2 nucleic acids MAY BE PRESENT.   A presumptive positive result was obtained on the submitted specimen  and confirmed on repeat testing.  While 2019 novel coronavirus  (SARS-CoV-2) nucleic acids may be present in the submitted sample  additional confirmatory testing may be necessary for epidemiological  and / or clinical management purposes  to differentiate between  SARS-CoV-2 and other Sarbecovirus currently known to infect humans.  If clinically indicated additional testing with an alternate test  methodology [phone]) is advised. The SARS-CoV-2 RNA is generally  detectable in upper and lower respiratory sp ecimens during the acute  phase of infection. The expected result is Negative. Fact Sheet for Patients:  BoilerBrush.com.cy Fact Sheet for Healthcare Providers: https://pope.com/ This test is not yet approved or cleared by the [place] FDA and has been authorized for detection and/or diagnosis of SARS-CoV-2 by FDA under an Emergency Use Authorization (EUA).  This EUA will remain in effect (meaning this test can be used) for the duration of  the COVID-19 declaration under Section 564(b)(1) of the Act, 21 U.S.C. section 360bbb-3(b)(1), unless the authorization is terminated or revoked sooner. Performed at [facility], [address], [place], [place] [address]   MRSA PCR Screening     Status: None   Collection Time: 03/24/19  8:50 PM  Result Value Ref Range Status   MRSA by PCR NEGATIVE NEGATIVE Final    Comment:        The GeneXpert MRSA Assay (FDA approved for NASAL specimens only), is one component of a comprehensive MRSA colonization surveillance program. It is not intended to diagnose MRSA infection nor to guide or monitor treatment for MRSA infections. Performed at [facility], [address] [place], [place] [address]   Culture, blood (routine x 2)     Status: None   Collection Time: 03/25/19  6:40 PM  Result Value Ref Range Status   Specimen Description   Final    BLOOD CENTRAL LINE Performed at [facility], [address], [place], [place] [address]    Special Requests   Final    BOTTLES DRAWN AEROBIC AND ANAEROBIC Blood Culture adequate volume Performed at [facility], [address], [place], [place] [address]    Culture   Final    NO GROWTH 5 DAYS Performed at [facility], [address] [place], [place] [address]    Report Status 03/30/2019 FINAL  Final  Culture, blood (routine x 2)     Status: None   Collection Time: 03/25/19  6:54 PM  Result Value Ref Range Status   Specimen Description   Final    BLOOD ARTERIAL Performed at [facility], [address], [place], [place] [address]    Special Requests   Final    BOTTLES DRAWN AEROBIC AND ANAEROBIC Blood Culture adequate volume Performed at [facility], [address] [place], [place] [address]    Culture   Final    NO GROWTH 5 DAYS Performed at [facility], [address] [place], [place] [address]     Report Status 03/30/2019 FINAL  Final  C difficile quick scan w PCR reflex     Status: None   Collection Time: 03/26/19  8:21 AM  Result Value Ref Range Status   C Diff antigen NEGATIVE NEGATIVE Final   C Diff toxin NEGATIVE NEGATIVE Final   C Diff interpretation No C. difficile detected.  Final    Comment: Performed at [facility], [address], [place], [place] [address]  Gastrointestinal Panel by PCR , Stool     Status: None   Collection Time: 03/26/19 12:00 PM  Result Value Ref Range Status  Campylobacter species NOT DETECTED NOT DETECTED Final   Plesimonas shigelloides NOT DETECTED NOT DETECTED Final   Salmonella species NOT DETECTED NOT DETECTED Final   Yersinia enterocolitica NOT DETECTED NOT DETECTED Final   Vibrio species NOT DETECTED NOT DETECTED Final   Vibrio cholerae NOT DETECTED NOT DETECTED Final   Enteroaggregative E coli (EAEC) NOT DETECTED NOT DETECTED Final   Enteropathogenic E coli (EPEC) NOT DETECTED NOT DETECTED Final   Enterotoxigenic E coli (ETEC) NOT DETECTED NOT DETECTED Final   Shiga like toxin producing E coli (STEC) NOT DETECTED NOT DETECTED Final   Shigella/Enteroinvasive E coli (EIEC) NOT DETECTED NOT DETECTED Final   Cryptosporidium NOT DETECTED NOT DETECTED Final   Cyclospora cayetanensis NOT DETECTED NOT DETECTED Final   Entamoeba histolytica NOT DETECTED NOT DETECTED Final   Giardia lamblia NOT DETECTED NOT DETECTED Final   Adenovirus F40/41 NOT DETECTED NOT DETECTED Final   Astrovirus NOT DETECTED NOT DETECTED Final   Norovirus GI/GII NOT DETECTED NOT DETECTED Final   Rotavirus A NOT DETECTED NOT DETECTED Final   Sapovirus (I, II, IV, and V) NOT DETECTED NOT DETECTED Final    Comment: Performed at [facility], [address] [place], [place] [address]         Radiology Studies: No results found.      Scheduled Meds: . Chlorhexidine Gluconate Cloth  6 each Topical Daily  . clopidogrel  75 mg Oral  Daily  . colestipol  1 g Oral BID  . furosemide  40 mg Oral Daily  . hydroxychloroquine  200 mg Oral BID  . mouth rinse  15 mL Mouth Rinse BID  . metoprolol tartrate  100 mg Oral BID  . pantoprazole  40 mg Oral BID  . pneumococcal 23 valent vaccine  0.5 mL Intramuscular Tomorrow-1000  . sodium chloride flush  10-40 mL Intracatheter Q12H  . sodium chloride flush  10-40 mL Intracatheter Q12H   Continuous Infusions: . sodium chloride 10 mL/hr at 03/31/19 0028  . piperacillin-tazobactam (ZOSYN)  IV 3.375 g (04/02/19 0158)     LOS: 9 days        Glade LloydKshitiz Ruthanna Macchia, MD [facility] 04/02/2019, 9:15 AM

## 2019-04-03 ENCOUNTER — Encounter (HOSPITAL_COMMUNITY): Payer: Self-pay | Admitting: Gastroenterology

## 2019-04-03 LAB — CBC WITH DIFFERENTIAL/PLATELET
Abs Immature Granulocytes: 0.18 10*3/uL — ABNORMAL HIGH (ref 0.00–0.07)
Basophils Absolute: 0 10*3/uL (ref 0.0–0.1)
Basophils Relative: 0 %
Eosinophils Absolute: 0.1 10*3/uL (ref 0.0–0.5)
Eosinophils Relative: 1 %
HCT: 27.8 % — ABNORMAL LOW (ref 36.0–46.0)
Hemoglobin: 9.1 g/dL — ABNORMAL LOW (ref 12.0–15.0)
Immature Granulocytes: 2 %
Lymphocytes Relative: 17 %
Lymphs Abs: 1.8 10*3/uL (ref 0.7–4.0)
MCH: 30 pg (ref 26.0–34.0)
MCHC: 32.7 g/dL (ref 30.0–36.0)
MCV: 91.7 fL (ref 80.0–100.0)
Monocytes Absolute: 1.2 10*3/uL — ABNORMAL HIGH (ref 0.1–1.0)
Monocytes Relative: 12 %
Neutro Abs: 7.2 10*3/uL (ref 1.7–7.7)
Neutrophils Relative %: 68 %
Platelets: 341 10*3/uL (ref 150–400)
RBC: 3.03 MIL/uL — ABNORMAL LOW (ref 3.87–5.11)
RDW: 14.3 % (ref 11.5–15.5)
WBC: 10.6 10*3/uL — ABNORMAL HIGH (ref 4.0–10.5)
nRBC: 0 % (ref 0.0–0.2)

## 2019-04-03 LAB — BASIC METABOLIC PANEL
Anion gap: 10 (ref 5–15)
BUN: 11 mg/dL (ref 8–23)
CO2: 22 mmol/L (ref 22–32)
Calcium: 8.3 mg/dL — ABNORMAL LOW (ref 8.9–10.3)
Chloride: 106 mmol/L (ref 98–111)
Creatinine, Ser: 1.01 mg/dL — ABNORMAL HIGH (ref 0.44–1.00)
GFR calc Af Amer: >60 mL/min (ref >60)
GFR calc non Af Amer: 58 mL/min — ABNORMAL LOW (ref >60)
Glucose, Bld: 79 mg/dL (ref 70–99)
Potassium: 3.2 mmol/L — ABNORMAL LOW (ref 3.5–5.1)
Sodium: 138 mmol/L (ref 135–145)

## 2019-04-03 LAB — MAGNESIUM: Magnesium: 1.5 mg/dL — ABNORMAL LOW (ref 1.7–2.4)

## 2019-04-03 MED ORDER — MAGNESIUM SULFATE 2 GM/50ML IV SOLN
2.0000 g | Freq: Once | INTRAVENOUS | Status: AC
  Administered 2019-04-03: 2 g via INTRAVENOUS
  Filled 2019-04-03: qty 50

## 2019-04-03 MED ORDER — POTASSIUM CHLORIDE 10 MEQ/100ML IV SOLN
10.0000 meq | INTRAVENOUS | Status: AC
  Administered 2019-04-03 (×4): 10 meq via INTRAVENOUS
  Filled 2019-04-03 (×4): qty 100

## 2019-04-03 MED ORDER — POTASSIUM CHLORIDE 20 MEQ/15ML (10%) PO SOLN
40.0000 meq | ORAL | Status: DC
  Administered 2019-04-03: 40 meq via ORAL
  Filled 2019-04-03: qty 30

## 2019-04-03 NOTE — Progress Notes (Signed)
I text paged MD with BMET results of K 3.2, Mag 1.5

## 2019-04-03 NOTE — Progress Notes (Signed)
Pt had a brief episode of decreased level of consciousness that lasted about 5 minutes. When she came around, Neuro checks were then same as they have been my shift, vital signs stable ( charted) but then patient threw up the potassium elixer that I gave her. She stated the pills were too big. I informed MD and he ordered IV. I also informed him of above events. Will continue to monitor

## 2019-04-03 NOTE — Progress Notes (Signed)
Patient ID: Michelle Graves, female   DOB: [birth year], 68 y.o.   MRN: [id number]  PROGRESS NOTE    Michelle Graves  VHQ:[id number] DOB: October 16, 1951 DOA: 03/24/2019 PCP: [facility]   Brief Narrative:  68 year old female with history of hypertension, CKD stage III, rheumatoid arthritis presented on 03/24/2019 with nausea, black emesis and abdominal pain for 3 days prior to presentation.  She was found to be hypotensive with acute kidney injury with creatinine of 5.  She had to be subsequently transferred to ICU for persistent hypotension and required pressors.  PCCM also was consulted.  Her blood pressure improved with fluid resuscitation and pressors.  CT scan of the abdomen showed diffuse colitis.  She was started on broad-spectrum antibiotics.  GI was consulted.  She became confused and agitated on 03/26/2019.  MRI of the brain on 03/30/2019 showed 2 small acute on chronic lacunar infarcts.  Neurology recommended transfer to [facility]  Assessment & Plan:   Ischemic stroke -MRI showed 2 small acute on chronic cerebral white matter lacunar infarcts, both in the right hemisphere -Neurology following. -Echo on 03/27/2019 showed normal left ventricular function with EF of 55 to 60%. -MRA of the head and neck did not show any significant stenosis. -Neurology has started the patient on Plavix as GI preferred Plavix over aspirin.  Statin has been discontinued as LDL was 27, goal is less than 70. -Neuro is recommending cardiac event monitoring versus loop recorder but has deferred to cardiology team.  Neurology has signed off.  Outpatient follow-up with neurology. -Monitor mental status. -PT/OT recommend SNF.  Social worker consult. -Diet as per SLP recommendations.  Nausea/vomiting/coffee-ground emesis Acute colitis -CT showed transverse and descending colon colitis -GI following.  Hemoglobin 10.6 today.  No gross bleeding.  Suspect peptic ulcer disease.  She  was on NSAIDs and prednisone at home. -Continue Protonix -Status post status post EGD on 04/01/2019 which showed linear gastric ulcers without any bleeding.  Colonoscopy showed severe possible ischemic colitis extending from distal transverse colon to proximal sigmoid colon.  There were some areas of hemorrhagic changes in the distal sigmoid colon.  GI was okay for the patient to be started preferably on Plavix as aspirin is ulcerogenic and to hold off on any anticoagulation if needed for 3 to 5 days since the procedure.  GI has signed off.  Outpatient follow-up with GI. -Zosyn was switched to Augmentin on 04/02/2019. --C. difficile negative, GI panel negative.  Leukocytosis -From above.  No labs today.  Monitor.  Acute kidney injury -Probably secondary to dehydration, ongoing NSAIDs and ACE inhibitor use. -Baseline creatinine from 1.2-1.6 -Much improved.  Creatinine back to baseline.  Ultrasound showed chronic renal disease, no hydronephrosis/obstruction  Hypotension/suspected hypovolemic shock History of hypertension -Patient had to be briefly transferred to ICU for pressors.  Blood pressure much improved after fluid resuscitation. Continue metoprolol and lasix.  Paroxysmal new onset A. fib with RVR versus SVT -Patient was treated with Cardizem drip briefly and subsequently converted to normal sinus rhythm.  She was switched to oral metoprolol. -Rapid A. fib was in the setting of sepsis so no anticoagulation was started.   -Echo showed normal left ventricular function with no new wall motion abnormalities.  Minimal troponin elevation secondary to supply demand ischemia. - Cardiology thought that she did have SVT, proximal atrial tachycardia and sinus tachycardia with PACs.  There was no evidence of A. fib as per cardiology.  She does not need anticoagulation.  Cardiology recommended outpatient follow-up with [facility] cardiology for consideration of an event recorder.  Cardiology has signed  off.  Acute hypoxic respiratory failure -Most likely secondary to fluid overload after massive fluid resuscitation -Much improved.  Currently on room air. -Patient was started on Lasix.  Earlier imaging had shown pleural effusion but PCCM did not recommend thoracentesis and it improved with diuresis.  Chest x-ray on 03/30/2019 showed mild basilar atelectasis and small left effusion.  Hypokalemia -No labs today  Hypomagnesemia  -No labs today.  Rheumatoid arthritis -On Plaquenil at home along with Enbrel every week.  Outpatient follow-up with rheumatology.  Recently completed a course of steroids.  Avoid NSAIDs.  Thrombocytopenia -Questionable cause.  Resolved.    DVT prophylaxis: SCDs Code Status: DNR Family Communication: Spoke to daughter [name] on phone on 03/31/2019 and on 04/02/2019 Disposition Plan: SNF once bed is available. Patient is medically stable for discharge.  Wants to know when she can be discharged, I think she would be okay going to SNF.  Consultants: Gastroenterology/neurology/PCCM  Procedures:  Echo IMPRESSIONS    1. The left ventricle has normal systolic function, with an ejection fraction of 55-60%. The cavity size was normal. There is mild concentric left ventricular hypertrophy. Left ventricular diastolic Doppler parameters are consistent with impaired  relaxation. Indeterminate filling pressures.  2. The mitral valve is grossly normal. Mild thickening of the mitral valve leaflet. There is mild mitral annular calcification present. Mitral valve regurgitation is mild to moderate by color flow Doppler. The MR jet is eccentric posteriorly directed.  3. The tricuspid valve is grossly normal.  4. The aortic valve is tricuspid. Aortic valve regurgitation is trivial by color flow Doppler.  5. The aortic root is normal in size and structure.  6. There is mild dilatation of the ascending aorta measuring 39 mm.  7. The interatrial septum was not well visualized.    EGD on 04/01/2019 which showed linear gastric ulcers without any bleeding.  Colonoscopy showed severe possible ischemic colitis extending from distal transverse colon to proximal sigmoid colon.  There were some areas of hemorrhagic changes in the distal sigmoid colon  Antimicrobials:  Zosyn from 03/26/2019-04/02/2019 Augmentin from 04/02/2019 onwards  Subjective: Patient seen and examined at bedside.  She is awake, poor historian.  Still has mild abdominal pain.  No overnight fever or vomiting.  No diarrhea.  Patient was to go home.   Objective: Vitals:   04/02/19 1500 04/02/19 2016 04/03/19 0047 04/03/19 0400  BP: 125/88 (!) 143/84 (!) 127/93 139/80  Pulse: 83 99 83 93  Resp: 18 18 18 18   Temp: 98 F (36.7 C) 98.5 F (36.9 C) 98.5 F (36.9 C) (!) 97.5 F (36.4 C)  TempSrc: Oral Oral Oral Oral  SpO2: 96% 99% 100% 95%  Weight:      Height:        Intake/Output Summary (Last 24 hours) at 04/03/2019 0733 Last data filed at 04/03/2019 0509 Gross per 24 hour  Intake -  Output 800 ml  Net -800 ml   Filed Weights   03/30/19 0500 04/01/19 0450 04/01/19 1322  Weight: 92.9 kg 92.4 kg 92.4 kg    Examination:  General exam: No acute distress.  Poor historian.   Respiratory system: Bilateral decreased breath sounds at bases, some scattered crackles.  No wheezing Cardiovascular system: Rate controlled, S1 & S2 heard Gastrointestinal system: Abdomen is nondistended, soft and mildly tender in the lower quadrant.  Normal bowel sounds heard. Extremities: No cyanosis; trace edema  Data Reviewed: I have personally reviewed following labs and imaging studies  CBC: Recent Labs  Lab 03/28/19 0358 03/29/19 0250 03/30/19 0418 04/01/19 0451 04/02/19 0330  WBC 11.7* 11.8* 14.4* 15.3* 11.5*  NEUTROABS 9.2* 9.0*  --  11.7* 8.2*  HGB 10.3* 10.3* 10.0* 10.6* 10.2*  HCT 31.0* 32.0* 30.5* 31.6* 30.8*  MCV 93.4 92.5 90.5 88.5 89.8  PLT 105* 109* 129* 253 279   Basic Metabolic Panel: Recent  Labs  Lab 03/28/19 0358 03/29/19 0250 03/30/19 0418 04/01/19 0451 04/02/19 0330  NA 142 141 142 139 138  K 4.1 3.6 3.4* 3.2* 3.5  CL 103 101 102 102 101  CO2 29 26 30 25 24   GLUCOSE 92 86 111* 103* 92  BUN 45* 34* 35* 25* 23  CREATININE 2.02* 1.59* 1.54* 1.57* 1.41*  CALCIUM 8.7* 8.3* 7.8* 8.2* 8.4*  MG  --   --   --  1.6* 2.1   GFR: Estimated Creatinine Clearance: 45.8 mL/min (A) (by C-G formula based on SCr of 1.41 mg/dL (H)). Liver Function Tests: No results for input(s): AST, ALT, ALKPHOS, BILITOT, PROT, ALBUMIN in the last 168 hours. No results for input(s): LIPASE, AMYLASE in the last 168 hours. Recent Labs  Lab 03/29/19 1446  AMMONIA 16   Coagulation Profile: No results for input(s): INR, PROTIME in the last 168 hours. Cardiac Enzymes: No results for input(s): CKTOTAL, CKMB, CKMBINDEX, TROPONINI in the last 168 hours. BNP (last 3 results) No results for input(s): PROBNP in the last 8760 hours. HbA1C: Recent Labs    04/01/19 0451  HGBA1C 6.2*   CBG: No results for input(s): GLUCAP in the last 168 hours. Lipid Profile: Recent Labs    04/01/19 0451  CHOL 64  HDL 17*  LDLCALC 27  TRIG 161100  CHOLHDL 3.8   Thyroid Function Tests: No results for input(s): TSH, T4TOTAL, FREET4, T3FREE, THYROIDAB in the last 72 hours. Anemia Panel: No results for input(s): VITAMINB12, FOLATE, FERRITIN, TIBC, IRON, RETICCTPCT in the last 72 hours. Sepsis Labs: No results for input(s): PROCALCITON, LATICACIDVEN in the last 168 hours.  Recent Results (from the past 240 hour(s))  SARS Coronavirus 2 (CEPHEID - Performed in [facility] hospital lab), Hosp Order     Status: None   Collection Time: 03/24/19  1:13 PM  Result Value Ref Range Status   SARS Coronavirus 2 NEGATIVE NEGATIVE Final    Comment: (NOTE) If result is NEGATIVE SARS-CoV-2 target nucleic acids are NOT DETECTED. The SARS-CoV-2 RNA is generally detectable in upper and lower  respiratory specimens during the  acute phase of infection. The lowest  concentration of SARS-CoV-2 viral copies this assay can detect is 250  copies / mL. A negative result does not preclude SARS-CoV-2 infection  and should not be used as the sole basis for treatment or other  patient management decisions.  A negative result may occur with  improper specimen collection / handling, submission of specimen other  than nasopharyngeal swab, presence of viral mutation(s) within the  areas targeted by this assay, and inadequate number of viral copies  (<250 copies / mL). A negative result must be combined with clinical  observations, patient history, and epidemiological information. If result is POSITIVE SARS-CoV-2 target nucleic acids are DETECTED. The SARS-CoV-2 RNA is generally detectable in upper and lower  respiratory specimens dur ing the acute phase of infection.  Positive  results are indicative of active infection with SARS-CoV-2.  Clinical  correlation with patient history and other diagnostic information is  necessary to determine patient infection status.  Positive results do  not rule out bacterial infection or co-infection with other viruses. If result is PRESUMPTIVE POSTIVE SARS-CoV-2 nucleic acids MAY BE PRESENT.   A presumptive positive result was obtained on the submitted specimen  and confirmed on repeat testing.  While 2019 novel coronavirus  (SARS-CoV-2) nucleic acids may be present in the submitted sample  additional confirmatory testing may be necessary for epidemiological  and / or clinical management purposes  to differentiate between  SARS-CoV-2 and other Sarbecovirus currently known to infect humans.  If clinically indicated additional testing with an alternate test  methodology [phone]([id number]) is advised. The SARS-CoV-2 RNA is generally  detectable in upper and lower respiratory sp ecimens during the acute  phase of infection. The expected result is Negative. Fact Sheet for Patients:   BoilerBrush.com.cyhttps://www.fda.gov/media/136312/download Fact Sheet for Healthcare Providers: https://pope.com/https://www.fda.gov/media/136313/download This test is not yet approved or cleared by the [place] FDA and has been authorized for detection and/or diagnosis of SARS-CoV-2 by FDA under an Emergency Use Authorization (EUA).  This EUA will remain in effect (meaning this test can be used) for the duration of the COVID-19 declaration under Section 564(b)(1) of the Act, 21 U.S.C. section 360bbb-3(b)(1), unless the authorization is terminated or revoked sooner. Performed at [facility], [address] [place], [place] [address]   MRSA PCR Screening     Status: None   Collection Time: 03/24/19  8:50 PM  Result Value Ref Range Status   MRSA by PCR NEGATIVE NEGATIVE Final    Comment:        The GeneXpert MRSA Assay (FDA approved for NASAL specimens only), is one component of a comprehensive MRSA colonization surveillance program. It is not intended to diagnose MRSA infection nor to guide or monitor treatment for MRSA infections. Performed at [facility], [address] [place], [place] [address]   Culture, blood (routine x 2)     Status: None   Collection Time: 03/25/19  6:40 PM  Result Value Ref Range Status   Specimen Description   Final    BLOOD CENTRAL LINE Performed at [facility], [address] [place], [place] [address]    Special Requests   Final    BOTTLES DRAWN AEROBIC AND ANAEROBIC Blood Culture adequate volume Performed at [facility], [address] [place], [place] [address]    Culture   Final    NO GROWTH 5 DAYS Performed at [facility], [address] [place], [place] [address]    Report Status 03/30/2019 FINAL  Final  Culture, blood (routine x 2)     Status: None   Collection Time: 03/25/19  6:54 PM  Result Value Ref Range Status   Specimen Description   Final    BLOOD  ARTERIAL Performed at [facility], [address] [place], [place] [address]    Special Requests   Final    BOTTLES DRAWN AEROBIC AND ANAEROBIC Blood Culture adequate volume Performed at [facility], [address] [place], [place] [address]    Culture   Final    NO GROWTH 5 DAYS Performed at [facility], [address] [place], [place] [address]    Report Status 03/30/2019 FINAL  Final  C difficile quick scan w PCR reflex     Status: None   Collection Time: 03/26/19  8:21 AM  Result Value Ref Range Status   C  Diff antigen NEGATIVE NEGATIVE Final   C Diff toxin NEGATIVE NEGATIVE Final   C Diff interpretation No C. difficile detected.  Final    Comment: Performed at [facility], [address], [place], [place] [address]  Gastrointestinal Panel by PCR , Stool     Status: None   Collection Time: 03/26/19 12:00 PM  Result Value Ref Range Status   Campylobacter species NOT DETECTED NOT DETECTED Final   Plesimonas shigelloides NOT DETECTED NOT DETECTED Final   Salmonella species NOT DETECTED NOT DETECTED Final   Yersinia enterocolitica NOT DETECTED NOT DETECTED Final   Vibrio species NOT DETECTED NOT DETECTED Final   Vibrio cholerae NOT DETECTED NOT DETECTED Final   Enteroaggregative E coli (EAEC) NOT DETECTED NOT DETECTED Final   Enteropathogenic E coli (EPEC) NOT DETECTED NOT DETECTED Final   Enterotoxigenic E coli (ETEC) NOT DETECTED NOT DETECTED Final   Shiga like toxin producing E coli (STEC) NOT DETECTED NOT DETECTED Final   Shigella/Enteroinvasive E coli (EIEC) NOT DETECTED NOT DETECTED Final   Cryptosporidium NOT DETECTED NOT DETECTED Final   Cyclospora cayetanensis NOT DETECTED NOT DETECTED Final   Entamoeba histolytica NOT DETECTED NOT DETECTED Final   Giardia lamblia NOT DETECTED NOT DETECTED Final   Adenovirus F40/41 NOT DETECTED NOT DETECTED Final   Astrovirus NOT DETECTED NOT DETECTED Final    Norovirus GI/GII NOT DETECTED NOT DETECTED Final   Rotavirus A NOT DETECTED NOT DETECTED Final   Sapovirus (I, II, IV, and V) NOT DETECTED NOT DETECTED Final    Comment: Performed at [facility], [address], [place], [place] [address]         Radiology Studies: No results found.      Scheduled Meds: . amoxicillin-clavulanate  1 tablet Oral Q12H  . Chlorhexidine Gluconate Cloth  6 each Topical Daily  . clopidogrel  75 mg Oral Daily  . colestipol  1 g Oral BID  . furosemide  40 mg Oral Daily  . hydroxychloroquine  200 mg Oral BID  . mouth rinse  15 mL Mouth Rinse BID  . metoprolol tartrate  100 mg Oral BID  . pantoprazole  40 mg Oral BID  . pneumococcal 23 valent vaccine  0.5 mL Intramuscular Tomorrow-1000  . sodium chloride flush  10-40 mL Intracatheter Q12H  . sodium chloride flush  10-40 mL Intracatheter Q12H   Continuous Infusions: . sodium chloride 10 mL/hr at 03/31/19 0028     LOS: 10 days        Glade Lloyd, MD [facility] 04/03/2019, 7:33 AM

## 2019-04-04 LAB — CBC WITH DIFFERENTIAL/PLATELET
Abs Immature Granulocytes: 0.18 10*3/uL — ABNORMAL HIGH (ref 0.00–0.07)
Basophils Absolute: 0 10*3/uL (ref 0.0–0.1)
Basophils Relative: 0 %
Eosinophils Absolute: 0.1 10*3/uL (ref 0.0–0.5)
Eosinophils Relative: 1 %
HCT: 26.5 % — ABNORMAL LOW (ref 36.0–46.0)
Hemoglobin: 8.9 g/dL — ABNORMAL LOW (ref 12.0–15.0)
Immature Granulocytes: 2 %
Lymphocytes Relative: 23 %
Lymphs Abs: 2.4 10*3/uL (ref 0.7–4.0)
MCH: 30.5 pg (ref 26.0–34.0)
MCHC: 33.6 g/dL (ref 30.0–36.0)
MCV: 90.8 fL (ref 80.0–100.0)
Monocytes Absolute: 1.4 10*3/uL — ABNORMAL HIGH (ref 0.1–1.0)
Monocytes Relative: 14 %
Neutro Abs: 6.1 10*3/uL (ref 1.7–7.7)
Neutrophils Relative %: 60 %
Platelets: 377 10*3/uL (ref 150–400)
RBC: 2.92 MIL/uL — ABNORMAL LOW (ref 3.87–5.11)
RDW: 14 % (ref 11.5–15.5)
WBC: 10.2 10*3/uL (ref 4.0–10.5)
nRBC: 0 % (ref 0.0–0.2)

## 2019-04-04 LAB — BASIC METABOLIC PANEL
Anion gap: 12 (ref 5–15)
BUN: 8 mg/dL (ref 8–23)
CO2: 17 mmol/L — ABNORMAL LOW (ref 22–32)
Calcium: 8.4 mg/dL — ABNORMAL LOW (ref 8.9–10.3)
Chloride: 108 mmol/L (ref 98–111)
Creatinine, Ser: 0.95 mg/dL (ref 0.44–1.00)
GFR calc Af Amer: >60 mL/min (ref >60)
GFR calc non Af Amer: >60 mL/min (ref >60)
Glucose, Bld: 75 mg/dL (ref 70–99)
Potassium: 3.8 mmol/L (ref 3.5–5.1)
Sodium: 137 mmol/L (ref 135–145)

## 2019-04-04 LAB — MAGNESIUM: Magnesium: 1.7 mg/dL (ref 1.7–2.4)

## 2019-04-04 MED ORDER — FUROSEMIDE 40 MG PO TABS: 40.0000 mg | ORAL_TABLET | Freq: Every day | ORAL | 0 refills | Status: DC

## 2019-04-04 MED ORDER — PANTOPRAZOLE SODIUM 40 MG PO TBEC: 40.0000 mg | DELAYED_RELEASE_TABLET | Freq: Two times a day (BID) | ORAL | 0 refills | Status: DC

## 2019-04-04 MED ORDER — ONDANSETRON HCL 4 MG PO TABS: 4.0000 mg | ORAL_TABLET | Freq: Four times a day (QID) | ORAL | 0 refills | Status: DC | PRN

## 2019-04-04 MED ORDER — CLOPIDOGREL BISULFATE 75 MG PO TABS: 75.0000 mg | ORAL_TABLET | Freq: Every day | ORAL | 0 refills | Status: DC

## 2019-04-04 NOTE — Progress Notes (Signed)
Patient ID: Michelle Graves, female   DOB: 01/13/1951, 68 y.o.   MRN: [id number]  PROGRESS NOTE    Michelle Graves  GHW:[id number] DOB: 03/31/1951 DOA: 03/24/2019 PCP: [facility]   Brief Narrative:  68 year old female with history of hypertension, CKD stage III, rheumatoid arthritis presented on 03/24/2019 with nausea, black emesis and abdominal pain for 3 days prior to presentation.  She was found to be hypotensive with acute kidney injury with creatinine of 5.  She had to be subsequently transferred to ICU for persistent hypotension and required pressors.  PCCM also was consulted.  Her blood pressure improved with fluid resuscitation and pressors.  CT scan of the abdomen showed diffuse colitis.  She was started on broad-spectrum antibiotics.  GI was consulted.  She became confused and agitated on 03/26/2019.  MRI of the brain on 03/30/2019 showed 2 small acute on chronic lacunar infarcts.  Neurology recommended transfer to [place]:   Ischemic stroke -MRI showed 2 small acute on chronic cerebral white matter lacunar infarcts, both in the right hemisphere -Neurology following. -Echo on 03/27/2019 showed normal left ventricular function with EF of 55 to 60%. -MRA of the head and neck did not show any significant stenosis. -Neurology has started the patient on Plavix as GI preferred Plavix over aspirin.  Statin has been discontinued as LDL was 27, goal is less than 70. -Neuro is recommending cardiac event monitoring versus loop recorder but has deferred to cardiology team.  Neurology has signed off.  Outpatient follow-up with neurology. -Monitor mental status. -PT/OT recommend SNF.  Social worker working on the same. -Diet as per SLP recommendations.  Nausea/vomiting/coffee-ground emesis Acute colitis -CT showed transverse and descending colon colitis -GI following.  Hemoglobin 8.9 today.  No gross bleeding.  Suspect peptic ulcer  disease.  She was on NSAIDs and prednisone at home. -Continue Protonix -Status post status post EGD on 04/01/2019 which showed linear gastric ulcers without any bleeding.  Colonoscopy showed severe possible ischemic colitis extending from distal transverse colon to proximal sigmoid colon.  There were some areas of hemorrhagic changes in the distal sigmoid colon.  GI was okay for the patient to be started preferably on Plavix as aspirin is ulcerogenic and to hold off on any anticoagulation if needed for 3 to 5 days since the procedure.  GI has signed off.  Outpatient follow-up with GI. -Zosyn was switched to Augmentin on 04/02/2019.  DC Augmentin.  Today is day #10 of antibiotics. --C. difficile negative, GI panel negative.  Leukocytosis -From above.  Resolved.  Acute kidney injury -Probably secondary to dehydration, ongoing NSAIDs and ACE inhibitor use. -Baseline creatinine from 1.2-1.6 -Much improved.  Creatinine back to baseline.  Ultrasound showed chronic renal disease, no hydronephrosis/obstruction  Hypotension/suspected hypovolemic shock History of hypertension -Patient had to be briefly transferred to ICU for pressors.  Blood pressure much improved after fluid resuscitation. Continue metoprolol and lasix.  Paroxysmal new onset A. fib with RVR versus SVT -Patient was treated with Cardizem drip briefly and subsequently converted to normal sinus rhythm.  She was switched to oral metoprolol. -Rapid A. fib was in the setting of sepsis so no anticoagulation was started.   -Echo showed normal left ventricular function with no new wall motion abnormalities.  Minimal troponin elevation secondary to supply demand ischemia. - Cardiology thought that she did have SVT, proximal atrial tachycardia and sinus tachycardia with PACs.  There was no evidence of A. fib as  per cardiology.  She does not need anticoagulation.  Cardiology recommended outpatient follow-up with [facility] cardiology for consideration of an  event recorder.  Cardiology has signed off.  Acute hypoxic respiratory failure -Most likely secondary to fluid overload after massive fluid resuscitation -Much improved.  Currently on room air. -Patient was started on Lasix.  Earlier imaging had shown pleural effusion but PCCM did not recommend thoracentesis and it improved with diuresis.  Chest x-ray on 03/30/2019 showed mild basilar atelectasis and small left effusion.  Hypokalemia -Improved  Hypomagnesemia  -Improved  Rheumatoid arthritis -On Plaquenil at home along with Enbrel every week.  Outpatient follow-up with rheumatology.  Recently completed a course of steroids.  Avoid NSAIDs.  Thrombocytopenia -Questionable cause.  Resolved.    DVT prophylaxis: SCDs Code Status: DNR Family Communication: Spoke to daughter [name] on phone on 03/31/2019 and on 04/02/2019 Disposition Plan: SNF once bed is available. Patient is medically stable for discharge.   Consultants: Gastroenterology/neurology/PCCM  Procedures:  Echo IMPRESSIONS    1. The left ventricle has normal systolic function, with an ejection fraction of 55-60%. The cavity size was normal. There is mild concentric left ventricular hypertrophy. Left ventricular diastolic Doppler parameters are consistent with impaired  relaxation. Indeterminate filling pressures.  2. The mitral valve is grossly normal. Mild thickening of the mitral valve leaflet. There is mild mitral annular calcification present. Mitral valve regurgitation is mild to moderate by color flow Doppler. The MR jet is eccentric posteriorly directed.  3. The tricuspid valve is grossly normal.  4. The aortic valve is tricuspid. Aortic valve regurgitation is trivial by color flow Doppler.  5. The aortic root is normal in size and structure.  6. There is mild dilatation of the ascending aorta measuring 39 mm.  7. The interatrial septum was not well visualized.   EGD on 04/01/2019 which showed linear gastric ulcers  without any bleeding.  Colonoscopy showed severe possible ischemic colitis extending from distal transverse colon to proximal sigmoid colon.  There were some areas of hemorrhagic changes in the distal sigmoid colon  Antimicrobials:  Zosyn from 03/26/2019-04/02/2019 Augmentin from 04/02/2019 onwards  Subjective: Patient seen and examined at bedside.  She is sleepy, wakes up on calling her name.  States that she was at home today.  No overnight fever, worsening abdominal pain or diarrhea.  Objective: Vitals:   04/03/19 2000 04/04/19 0000 04/04/19 0446 04/04/19 0733  BP: 129/86 140/78 (!) 127/92 132/86  Pulse: (!) 106 (!) 102 (!) 103 (!) 107  Resp: 20 20 20 18   Temp: 98 F (36.7 C)     TempSrc: Oral     SpO2: 100% 100% 97% 97%  Weight:      Height:       No intake or output data in the 24 hours ending 04/04/19 1148 Filed Weights   03/30/19 0500 04/01/19 0450 04/01/19 1322  Weight: 92.9 kg 92.4 kg 92.4 kg    Examination:  General exam: No  distress.  Poor historian.  Sleepy, wakes up slightly and answers only a few questions but wants to go home Respiratory system: Bilateral decreased breath sounds at bases, with some scattered crackles  cardiovascular system: Rate controlled, slightly tachycardic Gastrointestinal system: Abdomen is nondistended, soft and mildly tender in the lower quadrant.  Normal bowel sounds heard. Extremities: No cyanosis; trace edema     Data Reviewed: I have personally reviewed following labs and imaging studies  CBC: Recent Labs  Lab 03/29/19 0250 03/30/19 0418 04/01/19 0451 04/02/19 0330 04/03/19  1115 04/04/19 0651  WBC 11.8* 14.4* 15.3* 11.5* 10.6* 10.2  NEUTROABS 9.0*  --  11.7* 8.2* 7.2 6.1  HGB 10.3* 10.0* 10.6* 10.2* 9.1* 8.9*  HCT 32.0* 30.5* 31.6* 30.8* 27.8* 26.5*  MCV 92.5 90.5 88.5 89.8 91.7 90.8  PLT 109* 129* 253 279 341 377   Basic Metabolic Panel: Recent Labs  Lab 03/30/19 0418 04/01/19 0451 04/02/19 0330 04/03/19 1115  04/04/19 0651  NA 142 139 138 138 137  K 3.4* 3.2* 3.5 3.2* 3.8  CL 102 102 101 106 108  CO2 30 25 24 22  17*  GLUCOSE 111* 103* 92 79 75  BUN 35* 25* 23 11 8   CREATININE 1.54* 1.57* 1.41* 1.01* 0.95  CALCIUM 7.8* 8.2* 8.4* 8.3* 8.4*  MG  --  1.6* 2.1 1.5* 1.7   GFR: Estimated Creatinine Clearance: 68 mL/min (by C-G formula based on SCr of 0.95 mg/dL). Liver Function Tests: No results for input(s): AST, ALT, ALKPHOS, BILITOT, PROT, ALBUMIN in the last 168 hours. No results for input(s): LIPASE, AMYLASE in the last 168 hours. Recent Labs  Lab 03/29/19 1446  AMMONIA 16   Coagulation Profile: No results for input(s): INR, PROTIME in the last 168 hours. Cardiac Enzymes: No results for input(s): CKTOTAL, CKMB, CKMBINDEX, TROPONINI in the last 168 hours. BNP (last 3 results) No results for input(s): PROBNP in the last 8760 hours. HbA1C: No results for input(s): HGBA1C in the last 72 hours. CBG: No results for input(s): GLUCAP in the last 168 hours. Lipid Profile: No results for input(s): CHOL, HDL, LDLCALC, TRIG, CHOLHDL, LDLDIRECT in the last 72 hours. Thyroid Function Tests: No results for input(s): TSH, T4TOTAL, FREET4, T3FREE, THYROIDAB in the last 72 hours. Anemia Panel: No results for input(s): VITAMINB12, FOLATE, FERRITIN, TIBC, IRON, RETICCTPCT in the last 72 hours. Sepsis Labs: No results for input(s): PROCALCITON, LATICACIDVEN in the last 168 hours.  Recent Results (from the past 240 hour(s))  Culture, blood (routine x 2)     Status: None   Collection Time: 03/25/19  6:40 PM  Result Value Ref Range Status   Specimen Description   Final    BLOOD CENTRAL LINE Performed at [facility], [address] [place], [place] [address]    Special Requests   Final    BOTTLES DRAWN AEROBIC AND ANAEROBIC Blood Culture adequate volume Performed at [facility], [address] [place], [place] [address]    Culture   Final    NO GROWTH 5  DAYS Performed at [facility], [address] [place], [place] [address]    Report Status 03/30/2019 FINAL  Final  Culture, blood (routine x 2)     Status: None   Collection Time: 03/25/19  6:54 PM  Result Value Ref Range Status   Specimen Description   Final    BLOOD ARTERIAL Performed at [facility], [address] [place], [place] [address]    Special Requests   Final    BOTTLES DRAWN AEROBIC AND ANAEROBIC Blood Culture adequate volume Performed at [facility], [address] [place], [place] [address]    Culture   Final    NO GROWTH 5 DAYS Performed at [facility], [address] [place], [place] [address]    Report Status 03/30/2019 FINAL  Final  C difficile quick scan w PCR reflex     Status: None   Collection Time: 03/26/19  8:21 AM  Result Value Ref Range  Status   C Diff antigen NEGATIVE NEGATIVE Final   C Diff toxin NEGATIVE NEGATIVE Final   C Diff interpretation No C. difficile detected.  Final    Comment: Performed at [facility], [address] [place], [place] [address]  Gastrointestinal Panel by PCR , Stool     Status: None   Collection Time: 03/26/19 12:00 PM  Result Value Ref Range Status   Campylobacter species NOT DETECTED NOT DETECTED Final   Plesimonas shigelloides NOT DETECTED NOT DETECTED Final   Salmonella species NOT DETECTED NOT DETECTED Final   Yersinia enterocolitica NOT DETECTED NOT DETECTED Final   Vibrio species NOT DETECTED NOT DETECTED Final   Vibrio cholerae NOT DETECTED NOT DETECTED Final   Enteroaggregative E coli (EAEC) NOT DETECTED NOT DETECTED Final   Enteropathogenic E coli (EPEC) NOT DETECTED NOT DETECTED Final   Enterotoxigenic E coli (ETEC) NOT DETECTED NOT DETECTED Final   Shiga like toxin producing E coli (STEC) NOT DETECTED NOT DETECTED Final   Shigella/Enteroinvasive E coli (EIEC) NOT DETECTED NOT DETECTED Final   Cryptosporidium NOT DETECTED NOT  DETECTED Final   Cyclospora cayetanensis NOT DETECTED NOT DETECTED Final   Entamoeba histolytica NOT DETECTED NOT DETECTED Final   Giardia lamblia NOT DETECTED NOT DETECTED Final   Adenovirus F40/41 NOT DETECTED NOT DETECTED Final   Astrovirus NOT DETECTED NOT DETECTED Final   Norovirus GI/GII NOT DETECTED NOT DETECTED Final   Rotavirus A NOT DETECTED NOT DETECTED Final   Sapovirus (I, II, IV, and V) NOT DETECTED NOT DETECTED Final    Comment: Performed at [facility], [address], [place], [place] [address]         Radiology Studies: No results found.      Scheduled Meds: . amoxicillin-clavulanate  1 tablet Oral Q12H  . Chlorhexidine Gluconate Cloth  6 each Topical Daily  . clopidogrel  75 mg Oral Daily  . colestipol  1 g Oral BID  . furosemide  40 mg Oral Daily  . hydroxychloroquine  200 mg Oral BID  . mouth rinse  15 mL Mouth Rinse BID  . metoprolol tartrate  100 mg Oral BID  . pantoprazole  40 mg Oral BID  . pneumococcal 23 valent vaccine  0.5 mL Intramuscular Tomorrow-1000  . sodium chloride flush  10-40 mL Intracatheter Q12H  . sodium chloride flush  10-40 mL Intracatheter Q12H   Continuous Infusions: . sodium chloride 10 mL/hr at 03/31/19 0028     LOS: 11 days        Glade Lloyd, MD [facility] 04/04/2019, 11:48 AM

## 2019-04-04 NOTE — Progress Notes (Signed)
Physical Therapy Treatment Patient Details Name: Michelle Graves MRN: [id number] DOB: 1950/12/13 Today's Date: 04/04/2019    History of Present Illness 68 yo female admitted with acute kidney injury, hypovolemic shock, A fib with RVR, N/V with coffee ground emesis. Hx of CKD, RA, obesity, Sjogren's, chronic pain. Pt developed delirium 5/30. transferred to [facility] from [facility] 6/3. MRI 6/3-positive for 2 small acute on chronic cerebral white matter lacunar infarcts, both in the right hemisphere.  No associated hemorrhage or mass-effect    PT Comments    Patient seen for mobiliyt progression. Pt is making gradual progress toward PT goals. Pt continues to present with generalized weakness, decreased activity tolerance, and decreased awareness of safety/deficits. Pt tolerated gait training distance of 10 ft (to the bathroom) and became fatigued with HR up to 129 bpm and SOB. Pt stood from commode and then had to sit again prior to chair brought up to bathroom door as pt unable to ambulate farther. Continue to progress as tolerated with anticipated d/c to SNF for further skilled PT services.     Follow Up Recommendations  SNF     Equipment Recommendations  None recommended by PT    Recommendations for Other Services       Precautions / Restrictions Precautions Precautions: Fall Precaution Comments: A line Restrictions Weight Bearing Restrictions: No    Mobility  Bed Mobility Overal bed mobility: Needs Assistance Bed Mobility: Supine to Sit     Supine to sit: Min guard;HOB elevated     General bed mobility comments: min guard for safety; increased time and effort; use of rails and HOB elevated  Transfers Overall transfer level: Needs assistance Equipment used: Rolling walker (2 wheeled);1 person hand held assist Transfers: Sit to/from Stand Sit to Stand: Mod assist         General transfer comment: assistance required to power up into standing; cues for safe hand placement from EOB  and commode using grab bar; pt stood from commode and then returned to sitting again quickly due to feeling weak and fatigued  Ambulation/Gait Ambulation/Gait assistance: Min assist;+2 safety/equipment(close chair follow ) Gait Distance (Feet): (10 ft then 2 ft) Assistive device: Rolling walker (2 wheeled) Gait Pattern/deviations: Step-through pattern;Trunk flexed;Decreased step length - left;Decreased step length - right Gait velocity: decreased   General Gait Details: cues for upright posture and safe use of AD; pt ambulated 10 ft to bathroom and became very fatigued; chair brought to bathroom due as pt was unable to ambulate farther; HR noted up to 129 bpm while ambulating and pt SOB     Stairs             Wheelchair Mobility    Modified Rankin (Stroke Patients Only)       Balance Overall balance assessment: Needs assistance Sitting-balance support: No upper extremity supported;Feet supported Sitting balance-[name]: Fair Sitting balance - Comments: bil UEs used for support in sitting   Standing balance support: Bilateral upper extremity supported;During functional activity Standing balance-[name]: Poor Standing balance comment: bilat UE for gait                            Cognition Arousal/Alertness: Awake/alert Behavior During Therapy: Flat affect Overall Cognitive Status: Impaired/Different from baseline Area of Impairment: Following commands;Safety/judgement;Problem solving                       Following Commands: Follows one step commands with increased time  Safety/Judgement: Decreased awareness of safety;Decreased awareness of deficits   Problem Solving: Decreased initiation;Difficulty sequencing;Requires verbal cues General Comments: pt moving some what impulsively and not following cues when feeling fatigued and wanting to sit down; unable to navigate RW in bathroom and attempting to pick it up to get around commode and with  LOB requiring assistance to recover      Exercises      General Comments        Pertinent Vitals/Pain Pain Assessment: No/denies pain    Home Living                      Prior Function            PT Goals (current goals can now be found in the care plan section) Acute Rehab PT Goals Patient Stated Goal: pt unable to state Progress towards PT goals: Progressing toward goals    Frequency    Min 3X/week      PT Plan Current plan remains appropriate    Co-evaluation              AM-PAC PT "6 Clicks" Mobility   Outcome Measure  Help needed turning from your back to your side while in a flat bed without using bedrails?: A Little Help needed moving from lying on your back to sitting on the side of a flat bed without using bedrails?: A Lot Help needed moving to and from a bed to a chair (including a wheelchair)?: A Little Help needed standing up from a chair using your arms (e.g., wheelchair or bedside chair)?: A Little Help needed to walk in hospital room?: A Lot Help needed climbing 3-5 steps with a railing? : A Lot 6 Click Score: 15    End of Session Equipment Utilized During Treatment: Gait belt Activity Tolerance: Patient limited by fatigue Patient left: in chair;with call bell/phone within reach;with chair alarm set Nurse Communication: Mobility status PT Visit Diagnosis: Muscle weakness (generalized) (M62.81);Difficulty in walking, not elsewhere classified (R26.2)     Time: 2458-0998 PT Time Calculation (min) (ACUTE ONLY): 26 min  Charges:  $Gait Training: 8-22 mins $Therapeutic Activity: 8-22 mins                     Erline Levine, PTA Acute Rehabilitation Services Pager: [phone] Office: [phone]     Carolynne Edouard 04/04/2019, 3:25 PM

## 2019-04-04 NOTE — Discharge Summary (Signed)
Physician Discharge Summary  Michelle Graves QAS:[id number] DOB: Nov 04, 1950 DOA: 03/24/2019  PCP: [facility]  Admit date: 03/24/2019 Discharge date: 04/04/2019  Admitted From: Home Disposition: SNF  Recommendations for Outpatient Follow-up:  1. Follow up with PCP in 1 week with repeat CBC/BMP 2. Outpatient follow-up with GI and neurology 3. Follow up in ED if symptoms worsen or new appear   Home Health: No Equipment/Devices: None  Discharge Condition: Stable CODE STATUS: Full Diet recommendation: Heart healthy  Brief/Interim Summary: 68 year old female with history of hypertension, CKD stage III, rheumatoid arthritis presented on 03/24/2019 with nausea, black emesis and abdominal pain for 3 days prior to presentation.  She was found to be hypotensive with acute kidney injury with creatinine of 5.  She had to be subsequently transferred to ICU for persistent hypotension and required pressors.  PCCM also was consulted.  Her blood pressure improved with fluid resuscitation and pressors.  CT scan of the abdomen showed diffuse colitis.  She was started on broad-spectrum antibiotics.  GI was consulted.  She became confused and agitated on 03/26/2019.  MRI of the brain on 03/30/2019 showed 2 small acute on chronic lacunar infarcts.  Neurology recommended transfer to [facility].  Patient underwent EGD and colonoscopy.  Subsequently she was started on Plavix by neurology after cleared by GI.  GI and neurology signed off.  Zosyn was switched to Augmentin.  Augmentin has been discontinued today.  PT recommends SNF.  She will be discharged to SNF once bed is available.  Discharge Diagnoses:  Ischemic stroke -MRI showed 2 small acute on chronic cerebral white matter lacunar infarcts, both in the right hemisphere -Neurology following. -Echo on 03/27/2019 showed normal left ventricular function with EF of 55 to 60%. -MRA of the head and neck did not show any  significant stenosis. -Neurology has started the patient on Plavix as GI preferred Plavix over aspirin.  Statin has been discontinued as LDL was 27, goal is less than 70. -Neuro is recommending cardiac event monitoring versus loop recorder but has deferred to cardiology team.  Neurology has signed off.  Outpatient follow-up with neurology. -Monitor mental status. -PT/OT recommend SNF.    Discharge to nursing home once bed is available. -Diet as per SLP recommendations.  Nausea/vomiting/coffee-ground emesis Acute colitis -CT showed transverse and descending colon colitis -GI following.  Hemoglobin 8.9 today.  No gross bleeding.  Suspect peptic ulcer disease.  She was on NSAIDs and prednisone at home. -Continue Protonix -Status post status post EGD on 04/01/2019 which showed linear gastric ulcers without any bleeding.  Colonoscopy showed severe possible ischemic colitis extending from distal transverse colon to proximal sigmoid colon.  There were some areas of hemorrhagic changes in the distal sigmoid colon.  GI was okay for the patient to be started preferably on Plavix as aspirin is ulcerogenic and to hold off on any anticoagulation if needed for 3 to 5 days since the procedure.  GI has signed off.  Outpatient follow-up with GI. -Zosyn was switched to Augmentin on 04/02/2019.  Today is day #10 of antibiotics.  Antibiotics discontinued today. --C. difficile negative, GI panel negative.  Leukocytosis -From above.  Resolved.  Acute kidney injury -Probably secondary to dehydration, ongoing NSAIDs and ACE inhibitor use. -Baseline creatinine from 1.2-1.6 -Much improved.  Creatinine back to baseline.  Ultrasound showed chronic renal disease, no hydronephrosis/obstruction  Hypotension/suspected hypovolemic shock History of hypertension -Patient had to be briefly transferred to ICU for pressors.  Blood pressure much  improved after fluid resuscitation. Continue metoprolol and lasix.  Rest of the  antihypertensives have remained on hold.  These can be restarted for discharge of blood pressure question controlled.  Paroxysmal new onset A. fib with RVR versus SVT -Patient was treated with Cardizem drip briefly and subsequently converted to normal sinus rhythm.  She was switched to oral metoprolol. -Rapid A. fib was in the setting of sepsis so no anticoagulation was started.   -Echo showed normal left ventricular function with no new wall motion abnormalities.  Minimal troponin elevation secondary to supply demand ischemia. - Cardiology thought that she did have SVT, proximal atrial tachycardia and sinus tachycardia with PACs.  There was no evidence of A. fib as per cardiology.  She does not need anticoagulation.  Cardiology recommended outpatient follow-up with [facility] cardiology for consideration of an event recorder.  Cardiology has signed off.  Acute hypoxic respiratory failure -Most likely secondary to fluid overload after massive fluid resuscitation -Much improved.  Currently on room air. -Patient was started on Lasix.  Earlier imaging had shown pleural effusion but PCCM did not recommend thoracentesis and it improved with diuresis.  Chest x-ray on 03/30/2019 showed mild basilar atelectasis and small left effusion.  Hypokalemia -Improved  Hypomagnesemia  -Improved  Rheumatoid arthritis -On Plaquenil at home along with Enbrel every week.  Outpatient follow-up with rheumatology.  Recently completed a course of steroids.  Avoid NSAIDs.  Thrombocytopenia -Questionable cause.  Resolved.   Discharge Instructions  Discharge Instructions    Ambulatory referral to Neurology   Complete by:  As directed    Follow up with stroke clinic NP (Jessica Vanschaick or Darrol Angel, if both not available, consider Manson Allan, or Ahern) at [facility] in about 4 weeks. Thanks.   Diet - low sodium heart healthy   Complete by:  As directed    Increase activity slowly   Complete by:  As  directed      Allergies as of 04/04/2019   No Known Allergies     Medication List    STOP taking these medications   amLODipine 10 MG tablet Commonly known as:  NORVASC   aspirin EC 81 MG tablet   cloNIDine 0.3 MG tablet Commonly known as:  CATAPRES   cyclobenzaprine 10 MG tablet Commonly known as:  FLEXERIL   gabapentin 300 MG capsule Commonly known as:  NEURONTIN   hydrochlorothiazide 25 MG tablet Commonly known as:  HYDRODIURIL   HYDROcodone-acetaminophen 10-325 MG tablet Commonly known as:  NORCO   lisinopril 40 MG tablet Commonly known as:  ZESTRIL   omeprazole 20 MG capsule Commonly known as:  PRILOSEC   potassium chloride SA 20 MEQ tablet Commonly known as:  K-DUR   predniSONE 20 MG tablet Commonly known as:  DELTASONE   tiZANidine 2 MG tablet Commonly known as:  ZANAFLEX     TAKE these medications   calcium carbonate 1500 (600 Ca) MG Tabs tablet Commonly known as:  OSCAL Take 600 mg of elemental calcium by mouth daily.   CENTRUM SILVER 50+WOMEN PO Take 1 tablet by mouth daily.   CLEAR EYES FOR DRY EYES OP Place 2 drops into both eyes daily as needed (dry eyes).   clopidogrel 75 MG tablet Commonly known as:  PLAVIX Take 1 tablet (75 mg total) by mouth daily. Start taking on:  April 05, 2019   Enbrel SureClick 50 MG/ML injection Generic drug:  etanercept Inject 50 mg into the muscle once a week.   furosemide 40 MG tablet  Commonly known as:  LASIX Take 1 tablet (40 mg total) by mouth daily. Start taking on:  April 05, 2019   hydroxychloroquine 200 MG tablet Commonly known as:  PLAQUENIL Take 200 mg by mouth 2 (two) times daily.   metoprolol tartrate 100 MG tablet Commonly known as:  LOPRESSOR Take 100 mg by mouth 2 (two) times daily.   naproxen 500 MG tablet Commonly known as:  NAPROSYN Take 500 mg by mouth daily.   ondansetron 4 MG tablet Commonly known as:  ZOFRAN Take 1 tablet (4 mg total) by mouth every 6 (six) hours as needed  for nausea.   OSTEO BI-FLEX JOINT SHIELD PO Take 1 tablet by mouth daily.   pantoprazole 40 MG tablet Commonly known as:  PROTONIX Take 1 tablet (40 mg total) by mouth 2 (two) times daily.   terbinafine 250 MG tablet Commonly known as:  LAMISIL Take 250 mg by mouth daily.   traZODone 100 MG tablet Commonly known as:  DESYREL Take 100 mg by mouth at bedtime.      Follow-up Information    [facility]. Schedule an appointment as soon as possible for a visit in 4 week(s).   Specialty:  Neurology Contact information: [address] [place] [id number] [phone]       [facility]. Schedule an appointment as soon as possible for a visit in 1 week(s).   Specialty:  Family Medicine Why:  with repeat cbc/cmp Contact information: [address] [place] [phone]        Kathi DerBrahmbhatt, Parag, MD. Schedule an appointment as soon as possible for a visit in 1 week(s).   Specialty:  Gastroenterology Contact information: [address] [place] [address] [phone]          No Known Allergies  Consultations:  GI/neurology/PCCM   Procedures/Studies: Ct Abdomen Pelvis Wo Contrast  Result Date: 03/26/2019 CLINICAL DATA:  Diffuse abdominal pain and tenderness. EXAM: CT ABDOMEN AND PELVIS WITHOUT CONTRAST TECHNIQUE: Multidetector CT imaging of the abdomen and pelvis was performed following the standard protocol without IV contrast. COMPARISON:  03/19/2017 FINDINGS: Lower chest: Pleural effusions and lower lobe atelectasis or infiltrate are seen bilaterally, left side greater than right. Hepatobiliary: No mass visualized on this unenhanced exam. Gallbladder is unremarkable. Pancreas: No mass or inflammatory process visualized on this unenhanced exam. Spleen:  Within normal limits in size. Adrenals/Urinary tract: No evidence of urolithiasis or hydronephrosis.  Foley catheter is seen in the urinary bladder which is nearly completely empty. Stomach/Bowel: Long segment wall thickening is seen involving the distal transverse, descending, and rectosigmoid colon, consistent with colitis. No evidence of bowel obstruction. A tiny amount of free fluid is seen in the pelvis, however there is no evidence of abscess. Normal appendix visualized. Vascular/Lymphatic: No pathologically enlarged lymph nodes identified. No evidence of abdominal aortic aneurysm. Reproductive:  No mass or other significant abnormality. Other:  None. Musculoskeletal:  No suspicious bone lesions identified. IMPRESSION: 1. Mild-to-moderate colitis involving the distal transverse, descending, and rectosigmoid colon. 2. No evidence of abscess or bowel obstruction. 3. Bilateral pleural effusions and lower lobe atelectasis versus infiltrates, left side greater than right. Electronically Signed   By: Myles RosenthalJohn  Stahl M.D.   On: 03/26/2019 03:15   Ct Head Wo Contrast  Result Date: 03/29/2019 CLINICAL DATA:  Hypotensive. EXAM: CT HEAD WITHOUT CONTRAST TECHNIQUE: Contiguous axial images were obtained from the base of the skull through the  vertex without intravenous contrast. COMPARISON:  Head CT 01/25/2007 FINDINGS: Brain: Age advanced cerebral atrophy, ventriculomegaly and significant periventricular white matter disease, likely microvascular ischemic change. Findings progressive since the prior CT scan. Ventricles are in the midline and demonstrate normal configuration. No extra-axial fluid collections are identified. No CT findings for acute intracranial process such as hemispheric infarction or intracranial hemorrhage. No mass lesions are identified. The brainstem and cerebellum are grossly normal. Vascular: Moderate vascular calcifications but no definite aneurysm or hyperdense vessels. Skull: No skull fracture or worrisome bone lesions. Sinuses/Orbits: The paranasal sinuses and mastoid air cells are grossly clear.  The globes are intact. Other: No scalp lesions or hematoma. IMPRESSION: 1. Age advanced cerebral atrophy, ventriculomegaly and significant periventricular white matter disease. 2. No acute intracranial findings or mass lesion. Electronically Signed   By: Rudie Meyer M.D.   On: 03/29/2019 13:27   Ct Chest Wo Contrast  Result Date: 03/26/2019 CLINICAL DATA:  LEFT hilar fullness.  Short of breath EXAM: CT CHEST WITHOUT CONTRAST TECHNIQUE: Multidetector CT imaging of the chest was performed following the standard protocol without IV contrast. COMPARISON:  None. FINDINGS: Cardiovascular: Central venous line tip in the distal SVC. No pericardial effusion. Mediastinum/Nodes: No axillary or supraclavicular adenopathy. No mediastinal adenopathy. Fullness in LEFT hilum is difficult to assess without IV contrast. Lungs/Pleura: Moderate LEFT effusion with passive atelectasis of the LEFT lower lobe. No clear obstructing lesion identified. Smaller effusion on the RIGHT. Upper Abdomen: Limited view of the liver, kidneys, pancreas are unremarkable. Normal adrenal glands. Musculoskeletal: No aggressive osseous lesion. IMPRESSION: 1. Moderate to large LEFT effusion with passive atelectasis of the LEFT lower lobe. No clear mass identified. Fullness in the LEFT hilum is difficult to assess without IV contrast. 2. Small RIGHT effusion. 3. Central venous line in place without complication. Electronically Signed   By: Genevive Bi M.D.   On: 03/26/2019 17:25   Mr Maxine Glenn Head Wo Contrast  Result Date: 03/31/2019 CLINICAL DATA:  Admission with confusion and encephalopathy. 2 right hemispheric white matter infarctions identified yesterday. EXAM: MRA NECK WITHOUT  CONTRAST MRA HEAD WITHOUT CONTRAST TECHNIQUE: Multiplanar and multiecho pulse sequences of the neck were obtained without and with intravenous contrast. Angiographic images of the neck were obtained using MRA technique without and with intravenous contast.; Angiographic  images of the Circle of Willis were obtained using MRA technique without intravenous contrast. COMPARISON:  03/30/2019 FINDINGS: MRA NECK FINDINGS Brachiocephalic vessel origins are poorly seen using noncontrast technique. Both common carotid arteries are widely patent from the lower neck to the bifurcation region. Both carotid bifurcations appear widely patent without stenosis. Cervical internal carotid arteries appear normal. Antegrade flow is present in both vertebral arteries through the cervical region. The left is slightly larger than the right. No stenosis. MRA HEAD FINDINGS Both internal carotid arteries are patent through the skull base and siphon regions. No siphon stenosis. The anterior and middle cerebral vessels are patent without proximal stenosis, aneurysm or vascular malformation. No missing distal branch vessels are identified. Both vertebral arteries are patent through the foramen magnum with the left being dominant. Both posterior inferior cerebellar arteries show flow. There is no basilar stenosis. Superior cerebellar and posterior cerebral arteries are patent. There may be some distal vessel atherosclerotic irregularity of the intracranial branch vessels, most severe in the left PCA territory where there is diminished distal vessel demonstration. IMPRESSION: Allowing for the technical limitations of a noncontrast MR angiogram, no neck vessel abnormality is seen. Both carotid bifurcations appear widely  patent. No intracranial large or medium vessel occlusion or correctable proximal stenosis. Distal vessels show atherosclerotic irregularity, particularly in the left PCA territory with there is diminished distal vessel demonstration. Electronically Signed   By: Paulina FusiMark  Shogry M.D.   On: 03/31/2019 08:45   Mr Maxine GlennMra Neck Wo Contrast  Result Date: 03/31/2019 CLINICAL DATA:  Admission with confusion and encephalopathy. 2 right hemispheric white matter infarctions identified yesterday. EXAM: MRA NECK  WITHOUT  CONTRAST MRA HEAD WITHOUT CONTRAST TECHNIQUE: Multiplanar and multiecho pulse sequences of the neck were obtained without and with intravenous contrast. Angiographic images of the neck were obtained using MRA technique without and with intravenous contast.; Angiographic images of the Circle of Willis were obtained using MRA technique without intravenous contrast. COMPARISON:  03/30/2019 FINDINGS: MRA NECK FINDINGS Brachiocephalic vessel origins are poorly seen using noncontrast technique. Both common carotid arteries are widely patent from the lower neck to the bifurcation region. Both carotid bifurcations appear widely patent without stenosis. Cervical internal carotid arteries appear normal. Antegrade flow is present in both vertebral arteries through the cervical region. The left is slightly larger than the right. No stenosis. MRA HEAD FINDINGS Both internal carotid arteries are patent through the skull base and siphon regions. No siphon stenosis. The anterior and middle cerebral vessels are patent without proximal stenosis, aneurysm or vascular malformation. No missing distal branch vessels are identified. Both vertebral arteries are patent through the foramen magnum with the left being dominant. Both posterior inferior cerebellar arteries show flow. There is no basilar stenosis. Superior cerebellar and posterior cerebral arteries are patent. There may be some distal vessel atherosclerotic irregularity of the intracranial branch vessels, most severe in the left PCA territory where there is diminished distal vessel demonstration. IMPRESSION: Allowing for the technical limitations of a noncontrast MR angiogram, no neck vessel abnormality is seen. Both carotid bifurcations appear widely patent. No intracranial large or medium vessel occlusion or correctable proximal stenosis. Distal vessels show atherosclerotic irregularity, particularly in the left PCA territory with there is diminished distal vessel  demonstration. Electronically Signed   By: Paulina FusiMark  Shogry M.D.   On: 03/31/2019 08:45   Mr Brain Wo Contrast  Result Date: 03/30/2019 CLINICAL DATA:  68 year old female with persistent encephalopathy. EXAM: MRI HEAD WITHOUT CONTRAST TECHNIQUE: Multiplanar, multiecho pulse sequences of the brain and surrounding structures were obtained without intravenous contrast. COMPARISON:  Head CT 03/29/2019. FINDINGS: Brain: Intermittent motion artifact. There is a small right posterior centrum semi of bowel focus of heterogeneous diffusion (series 3, image 36) which does appear restricted on series 300, image 36. There is a nearby chronic lacunar infarct (series 8, image 57) and generalized underlying bilateral cerebral white matter T2 and FLAIR hyperintensity. There is a 2nd punctate area of restricted diffusion in the right periatrial white matter best seen on series 9 image 10 and series 900, image 10. No associated hemorrhage or mass effect. No other restricted diffusion. Chronic lacunar infarct of the right corona radiata and basal ganglia. Chronic lacunar infarcts in both thalami with microhemorrhage. There is a small area of chronic microhemorrhage in the right cerebellum on series 7, image 9. Probable chronic microhemorrhage in the right pons on image 13. No superimposed cortical encephalomalacia identified. No midline shift, mass effect, evidence of mass lesion, ventriculomegaly, extra-axial collection or acute intracranial hemorrhage. Cervicomedullary junction and pituitary are within normal limits. Vascular: Major intracranial vascular flow voids are preserved. Generalized intracranial artery tortuosity. Skull and upper cervical spine: Negative visible cervical spine. Visualized bone marrow signal is  within normal limits. Sinuses/Orbits: Grossly negative orbits. Paranasal Visualized paranasal sinuses and mastoids are stable and well pneumatized. Other: Scalp and face soft tissues appear negative. IMPRESSION: 1.  Positive for two small acute on chronic cerebral white matter lacunar infarcts, both in the right hemisphere. No associated hemorrhage or mass effect. 2. Underlying severe chronic small vessel disease, including several chronic micro-hemorrhages. Electronically Signed   By: Odessa Fleming M.D.   On: 03/30/2019 12:45   US Renal  Result Date: 03/24/2019 CLINICAL DATA:  Chronic kidney disease, stage III EXAM: RENAL / URINARY TRACT ULTRASOUND COMPLETE COMPARISON:  CT scan 03/19/2017 FINDINGS: Right Kidney: Renal measurements: 10.6 x 3.7 x 5.0 cm = volume: 104.7 mL. Slight increased renal echogenicity suggesting medical renal disease. Mild renal cortical thinning. No focal lesions or hydronephrosis. Left Kidney: Renal measurements: 10.6 x 5.4 x 5.0 cm = volume: 151.1 mL. Slight increased renal echogenicity suggesting medical renal disease. Mild renal cortical thinning. No worrisome renal lesions. No hydronephrosis. Bladder: Appears normal for degree of bladder distention. Other: Incidental gallstone noted the gallbladder measuring 1.7 cm. IMPRESSION: 1. Slight increased renal echogenicity and slight renal cortical thinning consistent with medical renal disease. 2. No worrisome renal lesions or hydronephrosis. 3. Incidental cholelithiasis. Electronically Signed   By: Rudie Meyer M.D.   On: 03/24/2019 18:45   Dg Chest Port 1 View  Result Date: 03/30/2019 CLINICAL DATA:  Follow-up pleural effusion EXAM: PORTABLE CHEST 1 VIEW COMPARISON:  03/29/2019 FINDINGS: Cardiac shadow is within normal limits. Left jugular central line is again seen and stable. The overall inspiratory effort is poor. Mild bibasilar atelectasis is noted. There are changes suggestive of small left pleural effusion posteriorly. IMPRESSION: Poor inspiratory effort with mild bibasilar atelectasis and small left effusion. Electronically Signed   By: Alcide Clever M.D.   On: 03/30/2019 07:19   Dg Chest Port 1 View  Result Date: 03/29/2019 CLINICAL DATA:   Follow-up pleural effusion EXAM: PORTABLE CHEST 1 VIEW COMPARISON:  03/28/2019 FINDINGS: Cardiac shadow remains enlarged. Left jugular central line is again seen and stable. Mild increased density is noted over the left chest consistent with small pleural effusion. The overall inspiratory effort is poor. No bony abnormality is noted. IMPRESSION: Persistent left posterior effusion. Electronically Signed   By: Alcide Clever M.D.   On: 03/29/2019 07:58   Dg Chest Port 1 View  Result Date: 03/28/2019 CLINICAL DATA:  Follow-up pleural effusion EXAM: PORTABLE CHEST 1 VIEW COMPARISON:  03/26/2019 FINDINGS: Cardiac shadow is stable. Left jugular central line is again seen and stable. Increasing density over the left hemithorax is noted consistent with a posteriorly layering effusion. Left basilar atelectasis is noted as well stable from the prior exam. No new focal abnormality is noted. IMPRESSION: Stable changes on the left similar to that seen on prior CT. Electronically Signed   By: Alcide Clever M.D.   On: 03/28/2019 07:22   Dg Chest Port 1 View  Result Date: 03/26/2019 CLINICAL DATA:  Left central line placement EXAM: PORTABLE CHEST 1 VIEW COMPARISON:  03/25/2019 FINDINGS: Left central line has been placed with the tip in the SVC. No pneumothorax. Layering left pleural effusion with left lower lobe atelectasis or infiltrate. Low lung volumes. Minimal right base atelectasis. Cardiomegaly, vascular congestion. IMPRESSION: Left central line placement with the tip in the SVC. No pneumothorax. Layering left effusion. Left base atelectasis or infiltrate. Minimal right base atelectasis. Cardiomegaly, vascular congestion. Electronically Signed   By: Charlett Nose M.D.   On: 03/26/2019 15:05  Dg Chest Port 1 View  Result Date: 03/25/2019 CLINICAL DATA:  Central line placement EXAM: PORTABLE CHEST 1 VIEW COMPARISON:  11/21/2018 chest radiograph. FINDINGS: Low lung volumes. Right internal jugular central venous catheter  terminates over the inferior cavoatrial junction. Stable cardiomediastinal silhouette with top-normal heart size. No pneumothorax. No pleural effusion. Vascular crowding without overt pulmonary edema. Left retrocardiac opacity. IMPRESSION: 1. No pneumothorax. Right internal jugular central venous catheter terminates over the inferior cavoatrial junction. 2. Low lung volumes with left retrocardiac opacity, either atelectasis or pneumonia. Chest radiograph follow-up advised. Electronically Signed   By: Delbert Phenix M.D.   On: 03/25/2019 18:32   Dg Abd Portable 1v  Result Date: 03/24/2019 CLINICAL DATA:  Abdominal pain.  Diverticulitis. EXAM: PORTABLE ABDOMEN - 1 VIEW COMPARISON:  08/10/2017. FINDINGS: There is a large amount of stool in the colon. There is some gaseous distension of both the colon and several small bowel loops. Phleboliths project over the patient's pelvis. There is no frank evidence of a high-grade small bowel obstruction, however the entirety abdomen is not visualized on this exam. There is a linear lucency coursing through the patient's pelvis that is favored to represent artifact. IMPRESSION: 1. Large amount of stool throughout the colon. 2. Mild gaseous distention of loops of colon and small bowel without evidence of a high-grade obstruction. 3. Linear lucency coursing through the pelvis favored to represent artifact, less likely free air. If there is high clinical suspicion for an acute intra-abdominal process short interval follow-up radiograph is recommended to confirm resolution of this finding. Electronically Signed   By: Katherine Mantle M.D.   On: 03/24/2019 21:43   [place] Ekg Site Rite  Result Date: 03/30/2019 If Site Rite image not attached, placement could not be confirmed due to current cardiac rhythm.  [place] Ekg Site Rite  Result Date: 03/24/2019 If [facility] image not attached, placement could not be confirmed due to current cardiac rhythm.   Echo IMPRESSIONS   1. The  left ventricle has normal systolic function, with an ejection fraction of 55-60%. The cavity size was normal. There is mild concentric left ventricular hypertrophy. Left ventricular diastolic Doppler parameters are consistent with impaired  relaxation. Indeterminate filling pressures. 2. The mitral valve is grossly normal. Mild thickening of the mitral valve leaflet. There is mild mitral annular calcification present. Mitral valve regurgitation is mild to moderate by color flow Doppler. The MR jet is eccentric posteriorly directed. 3. The tricuspid valve is grossly normal. 4. The aortic valve is tricuspid. Aortic valve regurgitation is trivial by color flow Doppler. 5. The aortic root is normal in size and structure. 6. There is mild dilatation of the ascending aorta measuring 39 mm. 7. The interatrial septum was not well visualized.   EGD on 04/01/2019 which showed linear gastric ulcers without any bleeding.  Colonoscopy showed severe possible ischemic colitis extending from distal transverse colon to proximal sigmoid colon.  There were some areas of hemorrhagic changes in the distal sigmoid colon   Subjective: Patient seen and examined at bedside.  She is sleepy, wakes up on calling her name.  States that she wants to go home today.  No overnight fever, worsening abdominal pain or diarrhea.  Discharge Exam: Vitals:   04/04/19 0733 04/04/19 1327  BP: 132/86 (!) 127/92  Pulse: (!) 107 99  Resp: 18 16  Temp:  (!) 100.8 F (38.2 C)  SpO2: 97% 100%    General exam: No  distress.  Poor historian.  Sleepy, wakes up  slightly and answers only a few questions but wants to go home Respiratory system: Bilateral decreased breath sounds at bases, with some scattered crackles  cardiovascular system: Rate controlled, slightly tachycardic Gastrointestinal system: Abdomen is nondistended, soft and mildly tender in the lower quadrant.  Normal bowel sounds heard. Extremities: No cyanosis; trace  edema     The results of significant diagnostics from this hospitalization (including imaging, microbiology, ancillary and laboratory) are listed below for reference.     Microbiology: Recent Results (from the past 240 hour(s))  Culture, blood (routine x 2)     Status: None   Collection Time: 03/25/19  6:40 PM  Result Value Ref Range Status   Specimen Description   Final    BLOOD CENTRAL LINE Performed at [facility], [address], [place], [place] [address]    Special Requests   Final    BOTTLES DRAWN AEROBIC AND ANAEROBIC Blood Culture adequate volume Performed at [facility], [address] [place], [place] [address]    Culture   Final    NO GROWTH 5 DAYS Performed at [facility], [address] [place], [place] [address]    Report Status 03/30/2019 FINAL  Final  Culture, blood (routine x 2)     Status: None   Collection Time: 03/25/19  6:54 PM  Result Value Ref Range Status   Specimen Description   Final    BLOOD ARTERIAL Performed at [facility], [address], [place], [place] [address]    Special Requests   Final    BOTTLES DRAWN AEROBIC AND ANAEROBIC Blood Culture adequate volume Performed at [facility], [address] [place], [place] [address]    Culture   Final    NO GROWTH 5 DAYS Performed at [facility], [address] [place], [place] [address]    Report Status 03/30/2019 FINAL  Final  C difficile quick scan w PCR reflex     Status: None   Collection Time: 03/26/19  8:21 AM  Result Value Ref Range Status   C Diff antigen NEGATIVE NEGATIVE Final   C Diff toxin NEGATIVE NEGATIVE Final   C Diff interpretation No C. difficile detected.  Final    Comment: Performed at [facility], [address] [place], [place] [address]  Gastrointestinal Panel by PCR , Stool     Status: None   Collection Time: 03/26/19 12:00 PM  Result Value Ref Range  Status   Campylobacter species NOT DETECTED NOT DETECTED Final   Plesimonas shigelloides NOT DETECTED NOT DETECTED Final   Salmonella species NOT DETECTED NOT DETECTED Final   Yersinia enterocolitica NOT DETECTED NOT DETECTED Final   Vibrio species NOT DETECTED NOT DETECTED Final   Vibrio cholerae NOT DETECTED NOT DETECTED Final   Enteroaggregative E coli (EAEC) NOT DETECTED NOT DETECTED Final   Enteropathogenic E coli (EPEC) NOT DETECTED NOT DETECTED Final   Enterotoxigenic E coli (ETEC) NOT DETECTED NOT DETECTED Final   Shiga like toxin producing E coli (STEC) NOT DETECTED NOT DETECTED Final   Shigella/Enteroinvasive E coli (EIEC) NOT DETECTED NOT DETECTED Final   Cryptosporidium NOT DETECTED NOT DETECTED Final   Cyclospora cayetanensis NOT DETECTED NOT DETECTED Final   Entamoeba histolytica NOT DETECTED NOT DETECTED Final   Giardia lamblia NOT DETECTED NOT DETECTED Final   Adenovirus F40/41 NOT DETECTED NOT DETECTED Final   Astrovirus NOT DETECTED NOT DETECTED Final   Norovirus GI/GII NOT DETECTED  NOT DETECTED Final   Rotavirus A NOT DETECTED NOT DETECTED Final   Sapovirus (I, II, IV, and V) NOT DETECTED NOT DETECTED Final    Comment: Performed at [facility], [address] [place], [place] [address]     Labs: BNP (last 3 results) Recent Labs    04/21/18 1843 04/22/18 1820  BNP 108.2* 133.9*   Basic Metabolic Panel: Recent Labs  Lab 03/30/19 0418 04/01/19 0451 04/02/19 0330 04/03/19 1115 04/04/19 0651  NA 142 139 138 138 137  K 3.4* 3.2* 3.5 3.2* 3.8  CL 102 102 101 106 108  CO2 17*  GLUCOSE 111* 103* 92 79 75  BUN 35* 25* CREATININE 1.54* 1.57* 1.41* 1.01* 0.95  CALCIUM 7.8* 8.2* 8.4* 8.3* 8.4*  MG  --  1.6* 2.1 1.5* 1.7   Liver Function Tests: No results for input(s): AST, ALT, ALKPHOS, BILITOT, PROT, ALBUMIN in the last 168 hours. No results for input(s): LIPASE, AMYLASE in the last 168 hours. Recent Labs  Lab  03/29/19 1446  AMMONIA 16   CBC: Recent Labs  Lab 03/29/19 0250 03/30/19 0418 04/01/19 0451 04/02/19 0330 04/03/19 1115 04/04/19 0651  WBC 11.8* 14.4* 15.3* 11.5* 10.6* 10.2  NEUTROABS 9.0*  --  11.7* 8.2* 7.2 6.1  HGB 10.3* 10.0* 10.6* 10.2* 9.1* 8.9*  HCT 32.0* 30.5* 31.6* 30.8* 27.8* 26.5*  MCV 92.5 90.5 88.5 89.8 91.7 90.8  PLT 109* 129* 253 279 341 377   Cardiac Enzymes: No results for input(s): CKTOTAL, CKMB, CKMBINDEX, TROPONINI in the last 168 hours. BNP: Invalid input(s): POCBNP CBG: No results for input(s): GLUCAP in the last 168 hours. D-Dimer No results for input(s): DDIMER in the last 72 hours. Hgb A1c No results for input(s): HGBA1C in the last 72 hours. Lipid Profile No results for input(s): CHOL, HDL, LDLCALC, TRIG, CHOLHDL, LDLDIRECT in the last 72 hours. Thyroid function studies No results for input(s): TSH, T4TOTAL, T3FREE, THYROIDAB in the last 72 hours.  Invalid input(s): FREET3 Anemia work up No results for input(s): VITAMINB12, FOLATE, FERRITIN, TIBC, IRON, RETICCTPCT in the last 72 hours. Urinalysis    Component Value Date/Time   COLORURINE YELLOW 03/27/2019 0401   APPEARANCEUR CLEAR 03/27/2019 0401   LABSPEC 1.011 03/27/2019 0401   PHURINE 7.0 03/27/2019 0401   GLUCOSEU NEGATIVE 03/27/2019 0401   HGBUR MODERATE (A) 03/27/2019 0401   BILIRUBINUR NEGATIVE 03/27/2019 0401   KETONESUR 5 (A) 03/27/2019 0401   PROTEINUR 30 (A) 03/27/2019 0401   UROBILINOGEN 1.0 06/24/2010 1850   NITRITE NEGATIVE 03/27/2019 0401   LEUKOCYTESUR NEGATIVE 03/27/2019 0401   Sepsis Labs Invalid input(s): PROCALCITONIN,  WBC,  LACTICIDVEN Microbiology Recent Results (from the past 240 hour(s))  Culture, blood (routine x 2)     Status: None   Collection Time: 03/25/19  6:40 PM  Result Value Ref Range Status   Specimen Description   Final    BLOOD CENTRAL LINE Performed at [facility], [address] [place], [place] [address]    Special  Requests   Final    BOTTLES DRAWN AEROBIC AND ANAEROBIC Blood Culture adequate volume Performed at [facility], [address] [place], [place] [address]    Culture   Final    NO GROWTH 5 DAYS Performed at [facility], [address], [place], [place] [address]    Report Status 03/30/2019 FINAL  Final  Culture, blood (routine x 2)  Status: None   Collection Time: 03/25/19  6:54 PM  Result Value Ref Range Status   Specimen Description   Final    BLOOD ARTERIAL Performed at [facility] [address], [place], [place] [address]    Special Requests   Final    BOTTLES DRAWN AEROBIC AND ANAEROBIC Blood Culture adequate volume Performed at [facility] [address] [place], [place] [address]    Culture   Final    NO GROWTH 5 DAYS Performed at [facility], [place] [address] [place], [place] [address]    Report Status 03/30/2019 FINAL  Final  C difficile quick scan w PCR reflex     Status: None   Collection Time: 03/26/19  8:21 AM  Result Value Ref Range Status   C Diff antigen NEGATIVE NEGATIVE Final   C Diff toxin NEGATIVE NEGATIVE Final   C Diff interpretation No C. difficile detected.  Final    Comment: Performed at [facility], [place] [address], [place], [place] [address]  Gastrointestinal Panel by PCR , Stool     Status: None   Collection Time: 03/26/19 12:00 PM  Result Value Ref Range Status   Campylobacter species NOT DETECTED NOT DETECTED Final   Plesimonas shigelloides NOT DETECTED NOT DETECTED Final   Salmonella species NOT DETECTED NOT DETECTED Final   Yersinia enterocolitica NOT DETECTED NOT DETECTED Final   Vibrio species NOT DETECTED NOT DETECTED Final   Vibrio cholerae NOT DETECTED NOT DETECTED Final   Enteroaggregative E coli (EAEC) NOT DETECTED NOT DETECTED Final   Enteropathogenic E coli (EPEC) NOT DETECTED NOT DETECTED Final   Enterotoxigenic E coli (ETEC) NOT  DETECTED NOT DETECTED Final   Shiga like toxin producing E coli (STEC) NOT DETECTED NOT DETECTED Final   Shigella/Enteroinvasive E coli (EIEC) NOT DETECTED NOT DETECTED Final   Cryptosporidium NOT DETECTED NOT DETECTED Final   Cyclospora cayetanensis NOT DETECTED NOT DETECTED Final   Entamoeba histolytica NOT DETECTED NOT DETECTED Final   Giardia lamblia NOT DETECTED NOT DETECTED Final   Adenovirus F40/41 NOT DETECTED NOT DETECTED Final   Astrovirus NOT DETECTED NOT DETECTED Final   Norovirus GI/GII NOT DETECTED NOT DETECTED Final   Rotavirus A NOT DETECTED NOT DETECTED Final   Sapovirus (I, II, IV, and V) NOT DETECTED NOT DETECTED Final    Comment: Performed at [facility], [address] [place], [place] [address]     Time coordinating discharge: 35 minutes  SIGNED:   Aline August, MD  [facility] 04/04/2019, 3:23 PM

## 2019-04-04 NOTE — TOC Transition Note (Signed)
Transition of Care [facility]) - CM/SW Discharge Note   Patient Details  Name: Michelle Graves MRN: [id number] Date of Birth: [birth year]  [facility] CM/SW Contact:  Michelle Graves, [place] Phone Number: 04/04/2019, 3:40 PM   Clinical Narrative:     Patient will DC to: [facility] Anticipated DC date: 04/04/2019 Family notified: Yes Transport by: Michelle Graves   Per MD patient ready for DC to . RN, patient, patient's family, and facility notified of DC. Discharge Summary and FL2 sent to facility. RN to call report prior to discharge [phone]). The patient will go to room 3217. DC packet on chart. Ambulance transport requested for patient.   CSW will sign off for now as social work intervention is no longer needed. Please consult [place] again if new needs arise.  Michelle Pavey, LCSW-A [place]/Clinical Social Work Department Cell: [phone]     Final next level of care: [place] Barriers to Discharge: No Barriers Identified   Patient Goals and CMS Choice Patient states their goals for this hospitalization and ongoing recovery are:: Pt will go to [facility] to complete rehab CMS Medicare.gov Compare Post Acute Care list provided to:: Patient Choice offered to / list presented to : NA  Discharge Placement   Existing PASRR number confirmed : 04/04/19          Patient chooses bed at: [facility] Patient to be transferred to facility by: [place] Name of family member notified: Michelle Graves, daughter Patient and family notified of of transfer: 04/04/19  Discharge Plan and Services   Discharge Planning Services: CM Consult            DME Arranged: N/A DME Agency: NA       HH Arranged: NA HH Agency: NA        Social Determinants of Health ([place]) Interventions     Readmission Risk Interventions No flowsheet data found.

## 2019-04-04 NOTE — Progress Notes (Signed)
  Speech Language Pathology Treatment: Dysphagia  Patient Details Name: Michelle Graves MRN: [id number] DOB: [birth year] Today's Date: 04/04/2019 Time: 8110-3159 SLP Time Calculation (min) (ACUTE ONLY): 8 min  Assessment / Plan / Recommendation Clinical Impression  Skilled treatment session focused on swallowing. SLP received pt asleep in bed. Pt able to arouse and agreeable to consuming POs. She requested cereal with milk. She had "Honey Bunches of Oats" on her dresser. SLP put in cup with milk. Pt able to feed herself spoonfuls of cereal and milk (mixed consistencies). Pt was free of overt s/s of aspiration. Pt with appearance of good oral phase as no residue was visible after swallow. At this time, pt doesn't appear to be demonstrating any s/s of dysphagia. Her risk of aspiration and aspiration pneumonia continue to be persistent given recent emesis on 04/03/19. Nursing and pt aware of general aspiration precautions as well as remaining upright after consuming POs or medications to reduce aspiration risks. All education and strategies to reduce risk of aspiration from emesis have been provided. ST also recommends pills could be given in yogurt or crushed for pt's GI comfort. At this time, ST services don't have any further skilled services to offer. Should pt condition changed, ST can be reconsulted.    HPI HPI: Michelle Graves is a 68 y.o. female with medical history significant of hypertension, CKD stage III, rheumatoid arthritis. Admitted due to nausea, black emesis/stool and abdominal pain.  Dx with colitis, AKI from hypovolemia, developed acute metabolic encephalopathy and respiratory failure from pleural effusions and atelectasis and anemia from GI bleed. 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 No associated hemorrahge.      SLP Plan  Discharge SLP treatment due to (comment);All goals met        Recommendations  Diet recommendations: Regular;Thin liquid Liquids provided via: Cup;Straw Medication Administration: Whole meds with liquid Supervision: Patient able to self feed Compensations: Slow rate;Small sips/bites Postural Changes and/or Swallow Maneuvers: Seated upright 90 degrees                Oral Care Recommendations: Oral care BID Follow up Recommendations: None SLP Visit Diagnosis: Dysphagia, unspecified (R13.10) Plan: Discharge SLP treatment due to (comment);All goals met       GO                [name] 04/04/2019, 9:39 AM

## 2019-04-12 ENCOUNTER — Other Ambulatory Visit: Payer: Medicare Other

## 2019-05-12 ENCOUNTER — Telehealth: Payer: Self-pay | Admitting: Adult Health

## 2019-05-12 NOTE — Telephone Encounter (Signed)
I called patient regarding her 7/20 appointment. Patient states that she is no longer at [facility]. This appt was originally set up as a virtual visit while pt was in their care. I spoke with patient's daughter and explained that we will need to keep this visit virtual, or reschedule to a different day for in office visit, due to schedule changes. Patient's daughter verbalized understanding and gave consent for this visit. I have sent e-mail to address pt daughter provided ([email or web address]) with link and directions, as well as office contact info for reference.  Pt understands that although there may be some limitations with this type of visit, we will take all precautions to reduce any security or privacy concerns.  Pt understands that this will be treated like an in office visit and we will file with pt's insurance, and there may be a patient responsible charge related to this service.

## 2019-05-13 ENCOUNTER — Observation Stay (HOSPITAL_COMMUNITY): Payer: Medicare Other

## 2019-05-13 ENCOUNTER — Inpatient Hospital Stay (HOSPITAL_COMMUNITY)
Admission: EM | Admit: 2019-05-13 | Discharge: 2019-05-16 | DRG: 683 | Disposition: A | Discharge status: Home Health | Payer: Medicare Other | Attending: Internal Medicine | Admitting: Internal Medicine

## 2019-05-13 ENCOUNTER — Inpatient Hospital Stay (HOSPITAL_COMMUNITY): Payer: Medicare Other

## 2019-05-13 ENCOUNTER — Emergency Department (HOSPITAL_COMMUNITY): Payer: Medicare Other

## 2019-05-13 ENCOUNTER — Other Ambulatory Visit: Payer: Self-pay

## 2019-05-13 ENCOUNTER — Encounter (HOSPITAL_COMMUNITY): Payer: Self-pay

## 2019-05-13 DIAGNOSIS — K5730 Diverticulosis of large intestine without perforation or abscess without bleeding: Secondary | ICD-10-CM

## 2019-05-13 DIAGNOSIS — E860 Dehydration: Secondary | ICD-10-CM | POA: Diagnosis present

## 2019-05-13 DIAGNOSIS — M25572 Pain in left ankle and joints of left foot: Secondary | ICD-10-CM | POA: Diagnosis not present

## 2019-05-13 DIAGNOSIS — Z6831 Body mass index (BMI) 31.0-31.9, adult: Secondary | ICD-10-CM

## 2019-05-13 DIAGNOSIS — Z1159 Encounter for screening for other viral diseases: Secondary | ICD-10-CM

## 2019-05-13 DIAGNOSIS — G9520 Unspecified cord compression: Secondary | ICD-10-CM | POA: Diagnosis present

## 2019-05-13 DIAGNOSIS — I129 Hypertensive chronic kidney disease with stage 1 through stage 4 chronic kidney disease, or unspecified chronic kidney disease: Secondary | ICD-10-CM

## 2019-05-13 DIAGNOSIS — R29898 Other symptoms and signs involving the musculoskeletal system: Secondary | ICD-10-CM

## 2019-05-13 DIAGNOSIS — Z8673 Personal history of transient ischemic attack (TIA), and cerebral infarction without residual deficits: Secondary | ICD-10-CM | POA: Diagnosis not present

## 2019-05-13 DIAGNOSIS — Z7902 Long term (current) use of antithrombotics/antiplatelets: Secondary | ICD-10-CM

## 2019-05-13 DIAGNOSIS — F329 Major depressive disorder, single episode, unspecified: Secondary | ICD-10-CM | POA: Diagnosis present

## 2019-05-13 DIAGNOSIS — M1990 Unspecified osteoarthritis, unspecified site: Secondary | ICD-10-CM | POA: Diagnosis present

## 2019-05-13 DIAGNOSIS — E611 Iron deficiency: Secondary | ICD-10-CM | POA: Diagnosis present

## 2019-05-13 DIAGNOSIS — R4781 Slurred speech: Secondary | ICD-10-CM | POA: Diagnosis present

## 2019-05-13 DIAGNOSIS — M3500 Sicca syndrome, unspecified: Secondary | ICD-10-CM | POA: Diagnosis present

## 2019-05-13 DIAGNOSIS — Z9889 Other specified postprocedural states: Secondary | ICD-10-CM | POA: Diagnosis not present

## 2019-05-13 DIAGNOSIS — B258 Other cytomegaloviral diseases: Secondary | ICD-10-CM | POA: Diagnosis present

## 2019-05-13 DIAGNOSIS — M545 Low back pain: Secondary | ICD-10-CM | POA: Diagnosis not present

## 2019-05-13 DIAGNOSIS — F339 Major depressive disorder, recurrent, unspecified: Secondary | ICD-10-CM

## 2019-05-13 DIAGNOSIS — I69354 Hemiplegia and hemiparesis following cerebral infarction affecting left non-dominant side: Secondary | ICD-10-CM

## 2019-05-13 DIAGNOSIS — Z79899 Other long term (current) drug therapy: Secondary | ICD-10-CM

## 2019-05-13 DIAGNOSIS — Z8249 Family history of ischemic heart disease and other diseases of the circulatory system: Secondary | ICD-10-CM | POA: Diagnosis not present

## 2019-05-13 DIAGNOSIS — D649 Anemia, unspecified: Secondary | ICD-10-CM | POA: Diagnosis not present

## 2019-05-13 DIAGNOSIS — E861 Hypovolemia: Secondary | ICD-10-CM | POA: Diagnosis present

## 2019-05-13 DIAGNOSIS — M25472 Effusion, left ankle: Secondary | ICD-10-CM | POA: Diagnosis not present

## 2019-05-13 DIAGNOSIS — M5126 Other intervertebral disc displacement, lumbar region: Secondary | ICD-10-CM | POA: Diagnosis present

## 2019-05-13 DIAGNOSIS — Z7982 Long term (current) use of aspirin: Secondary | ICD-10-CM | POA: Diagnosis not present

## 2019-05-13 DIAGNOSIS — N183 Chronic kidney disease, stage 3 (moderate): Secondary | ICD-10-CM | POA: Diagnosis present

## 2019-05-13 DIAGNOSIS — M6281 Muscle weakness (generalized): Secondary | ICD-10-CM | POA: Diagnosis not present

## 2019-05-13 DIAGNOSIS — R609 Edema, unspecified: Secondary | ICD-10-CM

## 2019-05-13 DIAGNOSIS — R627 Adult failure to thrive: Secondary | ICD-10-CM | POA: Diagnosis present

## 2019-05-13 DIAGNOSIS — R531 Weakness: Secondary | ICD-10-CM | POA: Diagnosis not present

## 2019-05-13 DIAGNOSIS — A0839 Other viral enteritis: Secondary | ICD-10-CM | POA: Diagnosis present

## 2019-05-13 DIAGNOSIS — N179 Acute kidney failure, unspecified: Principal | ICD-10-CM | POA: Diagnosis present

## 2019-05-13 DIAGNOSIS — I959 Hypotension, unspecified: Secondary | ICD-10-CM | POA: Diagnosis present

## 2019-05-13 DIAGNOSIS — Z8719 Personal history of other diseases of the digestive system: Secondary | ICD-10-CM | POA: Diagnosis not present

## 2019-05-13 DIAGNOSIS — N189 Chronic kidney disease, unspecified: Secondary | ICD-10-CM | POA: Diagnosis present

## 2019-05-13 DIAGNOSIS — I639 Cerebral infarction, unspecified: Secondary | ICD-10-CM

## 2019-05-13 DIAGNOSIS — Z833 Family history of diabetes mellitus: Secondary | ICD-10-CM

## 2019-05-13 DIAGNOSIS — Z8261 Family history of arthritis: Secondary | ICD-10-CM

## 2019-05-13 DIAGNOSIS — G8929 Other chronic pain: Secondary | ICD-10-CM | POA: Diagnosis present

## 2019-05-13 DIAGNOSIS — I1310 Hypertensive heart and chronic kidney disease without heart failure, with stage 1 through stage 4 chronic kidney disease, or unspecified chronic kidney disease: Secondary | ICD-10-CM | POA: Diagnosis present

## 2019-05-13 DIAGNOSIS — M069 Rheumatoid arthritis, unspecified: Secondary | ICD-10-CM | POA: Diagnosis present

## 2019-05-13 DIAGNOSIS — I679 Cerebrovascular disease, unspecified: Secondary | ICD-10-CM

## 2019-05-13 HISTORY — DX: Cerebral infarction, unspecified: I63.9

## 2019-05-13 LAB — COMPREHENSIVE METABOLIC PANEL
ALT: 18 U/L (ref 0–44)
AST: 31 U/L (ref 15–41)
Albumin: 2.2 g/dL — ABNORMAL LOW (ref 3.5–5.0)
Alkaline Phosphatase: 79 U/L (ref 38–126)
Anion gap: 10 (ref 5–15)
BUN: 15 mg/dL (ref 8–23)
CO2: 23 mmol/L (ref 22–32)
Calcium: 8.3 mg/dL — ABNORMAL LOW (ref 8.9–10.3)
Chloride: 106 mmol/L (ref 98–111)
Creatinine, Ser: 1.76 mg/dL — ABNORMAL HIGH (ref 0.44–1.00)
GFR calc Af Amer: 34 mL/min — ABNORMAL LOW (ref >60)
GFR calc non Af Amer: 30 mL/min — ABNORMAL LOW (ref >60)
Glucose, Bld: 96 mg/dL (ref 70–99)
Potassium: 4.2 mmol/L (ref 3.5–5.1)
Sodium: 139 mmol/L (ref 135–145)
Total Bilirubin: 0.5 mg/dL (ref 0.3–1.2)
Total Protein: 5.2 g/dL — ABNORMAL LOW (ref 6.5–8.1)

## 2019-05-13 LAB — URINALYSIS, ROUTINE W REFLEX MICROSCOPIC
Bilirubin Urine: NEGATIVE
Glucose, UA: NEGATIVE mg/dL
Hgb urine dipstick: NEGATIVE
Ketones, ur: NEGATIVE mg/dL
Leukocytes,Ua: NEGATIVE
Nitrite: NEGATIVE
Protein, ur: NEGATIVE mg/dL
Specific Gravity, Urine: 1.012 (ref 1.005–1.030)
pH: 7 (ref 5.0–8.0)

## 2019-05-13 LAB — CBC WITH DIFFERENTIAL/PLATELET
Abs Immature Granulocytes: 0 10*3/uL (ref 0.00–0.07)
Basophils Absolute: 0.1 10*3/uL (ref 0.0–0.1)
Basophils Relative: 1 %
Eosinophils Absolute: 0.3 10*3/uL (ref 0.0–0.5)
Eosinophils Relative: 4 %
HCT: 25.5 % — ABNORMAL LOW (ref 36.0–46.0)
Hemoglobin: 7.8 g/dL — ABNORMAL LOW (ref 12.0–15.0)
Lymphocytes Relative: 22 %
Lymphs Abs: 1.4 10*3/uL (ref 0.7–4.0)
MCH: 31 pg (ref 26.0–34.0)
MCHC: 30.6 g/dL (ref 30.0–36.0)
MCV: 101.2 fL — ABNORMAL HIGH (ref 80.0–100.0)
Monocytes Absolute: 0.1 10*3/uL (ref 0.1–1.0)
Monocytes Relative: 1 %
Neutro Abs: 4.6 10*3/uL (ref 1.7–7.7)
Neutrophils Relative %: 72 %
Platelets: 234 10*3/uL (ref 150–400)
RBC: 2.52 MIL/uL — ABNORMAL LOW (ref 3.87–5.11)
RDW: 19.1 % — ABNORMAL HIGH (ref 11.5–15.5)
WBC: 6.4 10*3/uL (ref 4.0–10.5)
nRBC: 0 % (ref 0.0–0.2)
nRBC: 1 /100 WBC — ABNORMAL HIGH

## 2019-05-13 LAB — LACTATE DEHYDROGENASE: LDH: 304 U/L — ABNORMAL HIGH (ref 98–192)

## 2019-05-13 LAB — CK: Total CK: 40 U/L (ref 38–234)

## 2019-05-13 LAB — LACTIC ACID, PLASMA
Lactic Acid, Venous: 1.6 mmol/L (ref 0.5–1.9)
Lactic Acid, Venous: 1.2 mmol/L (ref 0.5–1.9)

## 2019-05-13 LAB — SARS CORONAVIRUS 2 BY RT PCR (HOSPITAL ORDER, PERFORMED IN ~~LOC~~ HOSPITAL LAB): SARS Coronavirus 2: NEGATIVE

## 2019-05-13 LAB — POC OCCULT BLOOD, ED: Fecal Occult Bld: NEGATIVE

## 2019-05-13 MED ORDER — CLOPIDOGREL BISULFATE 75 MG PO TABS
75.0000 mg | ORAL_TABLET | Freq: Every day | ORAL | Status: DC
  Administered 2019-05-13 – 2019-05-16 (×4): 75 mg via ORAL
  Filled 2019-05-13 (×4): qty 1

## 2019-05-13 MED ORDER — TRAZODONE HCL 50 MG PO TABS
100.0000 mg | ORAL_TABLET | Freq: Every day | ORAL | Status: DC
  Administered 2019-05-13 – 2019-05-14 (×2): 100 mg via ORAL
  Filled 2019-05-13 (×2): qty 2

## 2019-05-13 MED ORDER — GABAPENTIN 600 MG PO TABS
300.0000 mg | ORAL_TABLET | Freq: Three times a day (TID) | ORAL | Status: DC
  Administered 2019-05-13 – 2019-05-16 (×9): 300 mg via ORAL
  Filled 2019-05-13 (×9): qty 1

## 2019-05-13 MED ORDER — ACETAMINOPHEN 325 MG PO TABS
650.0000 mg | ORAL_TABLET | Freq: Four times a day (QID) | ORAL | Status: DC | PRN
  Administered 2019-05-16: 650 mg via ORAL
  Filled 2019-05-13: qty 2

## 2019-05-13 MED ORDER — SENNOSIDES-DOCUSATE SODIUM 8.6-50 MG PO TABS: 1.0000 | ORAL_TABLET | Freq: Every evening | ORAL | Status: DC | PRN

## 2019-05-13 MED ORDER — COLESTIPOL HCL 1 G PO TABS
1.0000 g | ORAL_TABLET | Freq: Two times a day (BID) | ORAL | Status: DC
  Administered 2019-05-13 – 2019-05-16 (×7): 1 g via ORAL
  Filled 2019-05-13 (×7): qty 1

## 2019-05-13 MED ORDER — VALGANCICLOVIR HCL 450 MG PO TABS
900.0000 mg | ORAL_TABLET | Freq: Every day | ORAL | Status: DC
  Administered 2019-05-13 – 2019-05-16 (×4): 900 mg via ORAL
  Filled 2019-05-13 (×4): qty 2

## 2019-05-13 MED ORDER — PROMETHAZINE HCL 25 MG PO TABS: 12.5000 mg | ORAL_TABLET | Freq: Four times a day (QID) | ORAL | Status: DC | PRN

## 2019-05-13 MED ORDER — DULOXETINE HCL 30 MG PO CPEP
30.0000 mg | ORAL_CAPSULE | Freq: Every day | ORAL | Status: DC
  Administered 2019-05-13 – 2019-05-16 (×4): 30 mg via ORAL
  Filled 2019-05-13 (×4): qty 1

## 2019-05-13 MED ORDER — ACETAMINOPHEN 650 MG RE SUPP: 650.0000 mg | Freq: Four times a day (QID) | RECTAL | Status: DC | PRN

## 2019-05-13 MED ORDER — HEPARIN SODIUM (PORCINE) 5000 UNIT/ML IJ SOLN
5000.0000 [IU] | Freq: Three times a day (TID) | INTRAMUSCULAR | Status: DC
  Administered 2019-05-13 – 2019-05-16 (×9): 5000 [IU] via SUBCUTANEOUS
  Filled 2019-05-13 (×9): qty 1

## 2019-05-13 MED ORDER — LACTATED RINGERS IV SOLN
INTRAVENOUS | Status: AC
  Administered 2019-05-13: 1000 mL via INTRAVENOUS

## 2019-05-13 MED ORDER — CALCIUM CARBONATE 1250 (500 CA) MG PO TABS
500.0000 mg | ORAL_TABLET | Freq: Every day | ORAL | Status: DC
  Administered 2019-05-13 – 2019-05-16 (×4): 500 mg via ORAL
  Filled 2019-05-13 (×4): qty 1

## 2019-05-13 MED ORDER — SODIUM CHLORIDE 0.9 % IV BOLUS
1000.0000 mL | Freq: Once | INTRAVENOUS | Status: AC
  Administered 2019-05-13: 1000 mL via INTRAVENOUS

## 2019-05-13 MED ORDER — TRAZODONE HCL 50 MG PO TABS: 100.0000 mg | ORAL_TABLET | Freq: Every day | ORAL | Status: DC

## 2019-05-13 MED ORDER — LACTATED RINGERS IV BOLUS
1000.0000 mL | Freq: Once | INTRAVENOUS | Status: AC
  Administered 2019-05-13: 1000 mL via INTRAVENOUS

## 2019-05-13 MED ORDER — ATORVASTATIN CALCIUM 40 MG PO TABS: 40.0000 mg | ORAL_TABLET | Freq: Every day | ORAL | Status: DC

## 2019-05-13 MED ORDER — PANTOPRAZOLE SODIUM 40 MG PO TBEC
40.0000 mg | DELAYED_RELEASE_TABLET | Freq: Two times a day (BID) | ORAL | Status: DC
  Administered 2019-05-13 – 2019-05-16 (×7): 40 mg via ORAL
  Filled 2019-05-13 (×7): qty 1

## 2019-05-13 MED ORDER — HYDROXYCHLOROQUINE SULFATE 200 MG PO TABS
200.0000 mg | ORAL_TABLET | Freq: Two times a day (BID) | ORAL | Status: DC
  Administered 2019-05-13 – 2019-05-16 (×7): 200 mg via ORAL
  Filled 2019-05-13 (×7): qty 1

## 2019-05-13 MED ORDER — TERBINAFINE HCL 250 MG PO TABS: 250.0000 mg | ORAL_TABLET | Freq: Every day | ORAL | Status: DC

## 2019-05-13 NOTE — ED Triage Notes (Signed)
Pt from [facility] with c/o generalized wkness; pt had recent CVA in May with L sided deficits; pt went to rehab x 1 wk and then got colitis and sent to [facility]; pt released from [facility] on Tues; family stated that patient was little "off" and about 9p pt presented with slurred speech; since speech has cleared; pt c/o headache on R side; states it occurs nightly and family gives an aleve and it goes away; pt is on blood thinner; 90/50; 80; CBG 122; 16; 94% on RA

## 2019-05-13 NOTE — H&P (Addendum)
Date: 05/13/2019               Patient Name:  Michelle Graves MRN: [id number]  DOB: September 28, 1951 Age / Sex: 68 y.o., female   PCP: [facility]         Medical Service: Internal Medicine Teaching Service         Attending Physician: Dr. Lynnae January, Real Cons, MD    First Contact: Dr. Charleen Kirks Pager: [phone]  Second Contact: Dr. Frederico Hamman Pager: [phone]       After Hours (After 5p/  First Contact Pager: [phone]  weekends / holidays): Second Contact Pager: [phone]   Chief Complaint: Generalized weakness, headache, slurred speech  History of Present Illness:  Michelle Graves is a 68 year old female with essential hypertension, ischemic stroke with left-sided deficits in May 2020, recent GI bleed secondary to pancolitis, rheumatoid arthritis, and CKD 3 who presents with generalized weakness, headache, slurred speech (which patient is due to not having her dentures in), post a fall.    Patient was trying to walk slowly to the bathroom without any assistive devices yesterday 7/16. Her daughter was beside her to help her and the patient's legs gave out and the patient slowly placed her on the floor. The patient was not able to stand back up on her own and her daughter was not able to lift her which prompted her daughter to call the non-emergency hotline.   The patient has had a lot of changes occur to her health over the past few months.  Presented to [facility] on 03/24/2019 with black emesis, nausea, abdominal pain.  Imaging revealed 2 small acute on chronic ischemic lacunar infarcts which left her with left-sided deficits. According to PT notes the patient had generalized weakness and decreased activity tolerance and therefore recommended patient be discharged to SNF. During this specialization the patient was worked up for coffee-ground emesis, acute blood loss anemia.  EGD showed linear gastric ulcers without acute active bleeding and colonoscopy showed "severe  (possible) ischemic colitis extending from distal transverse colon to proximal sigmoid colon with some areas of hemorrhagic changes in the distal sigmoid colon.  The scope was advanced up to ascending colon and procedure was terminated to avoid further complication from looping".  Patient was discharged with Protonix, switch from Augmentin to Zosyn on 04/02/2019, and told to continue on Plavix rather than aspirin due to its ulcerogenic properties.  At baseline, the patient is able to live on her own, cook, clean, shower, and dress herself. In May 2020 she was admitted at [facility] 03/24/19-04/04/19 for ischemic lacunar infarcts which caused her to have left-sided deficits in May 2020 and she also was treated for acute colitis and linear gastric ulcers. Upper and lower endoscopy showed linear ulcerations in esophagus with severe colitis with appearance similar to ischemic colitis on lower endoscopy with areas of hemorrhagic change. Colonoscopy showed severe possible ischemic colitis from distal transverse colon to proximal sigmoid colon with some areas of hemorrhagic changes in distal sigmoid. She was discharged to a rehab facility for 1 week.   Patient was at [facility] rehab facility from 04/04/19-04/13/19 [facility].  She was then discharged and was staying at daughter Michelle Graves's house when they had noticed that the patient was not able to hold her bowels, has had decreased oral intake, and had generalized weakness.   Subsequently the patient was admitted at [facility] from 04/25/2019-05/10/2023 for BRBPR, decreased po intake, and abdominal pain thought  to be due to CMV colitis.  Mesenteric artery Doppler (04/27/19) showed normal celiac artery, SMA, IMA.  Flexible sigmoidoscopy 04/30/2019 showed diverticulosis in sigmoid colon, diffuse severe inflammation (diffuse circumferential in nature care to rise by congestion, erosions, friability, narrowed lumen, loss of vasculature) from 35 to 60 cm proximal to  anus rule out infectious colitis and rule out ischemic colitis (biopsied).  Biopsy revealed IBD type inflammation.  Upper endoscopy was repeated 05/04/2019 which showed Z line regular, erythematous mucosa in the antrum was biopsied, normal duodenum was biopsied. Barium swallow done to evaluate low po intake did not show any upper GI obsturction or pathology.  CMV quantitative test was positive during hospitalization and therefore patient was started on IV ganciclovir on 7/8 was transitioned to po valganciclovir on discharge and per GI recommendations on a prednisone taper that ended on 7/12. Etanercept for her RA was held.  Patient was recommended to have a follow-up with PCP in 7 days and get a CMV quantitative PCR to test for resolution while on p.o. ganciclovir.  She was also told to get a repeat flex sigmoidoscopy in 3 months. There was not physical exam on discharge summary from [facility] 7/14, but per note 6/29 patient had no cranial nerve deficits, normal gait, no focal deficits.   Patient currently denies fever/chills, cough, dyspnea, chest pain, abdominal pain, nausea/vomiting/diarrhea. She states that her mother had a similar type of weakness.    In the ED, patient was afebrile, normal rates 60-70s, respirations 16-24, hypotensive 80- 100/50-70s, saturating in 90s on room air. Given 2L NS bolus in ED.  Meds:  Current Meds  Medication Sig  . amLODipine (NORVASC) 10 MG tablet Take 10 mg by mouth daily.  . calcium carbonate (OSCAL) 1500 (600 Ca) MG TABS tablet Take 600 mg of elemental calcium by mouth daily.  . Carboxymethylcellul-Glycerin (CLEAR EYES FOR DRY EYES OP) Place 2 drops into both eyes daily as needed (dry eyes).  . cloNIDine (CATAPRES) 0.3 MG tablet Take 0.3 mg by mouth daily.  . clopidogrel (PLAVIX) 75 MG tablet Take 1 tablet (75 mg total) by mouth daily.  . furosemide (LASIX) 40 MG tablet Take 1 tablet (40 mg total) by mouth daily.  . hydroxychloroquine (PLAQUENIL) 200 MG tablet Take 200  mg by mouth 2 (two) times daily.   . metoprolol tartrate (LOPRESSOR) 100 MG tablet Take 100 mg by mouth 2 (two) times daily.   . Misc Natural Products (OSTEO BI-FLEX JOINT SHIELD PO) Take 1 tablet by mouth daily.  . Multiple Vitamins-Minerals (CENTRUM SILVER 50+WOMEN PO) Take 1 tablet by mouth daily.  . naproxen (NAPROSYN) 500 MG tablet Take 500 mg by mouth daily as needed for mild pain.   Marland Kitchen ondansetron (ZOFRAN) 4 MG tablet Take 1 tablet (4 mg total) by mouth every 6 (six) hours as needed for nausea.  . pantoprazole (PROTONIX) 40 MG tablet Take 1 tablet (40 mg total) by mouth 2 (two) times daily.  Marland Kitchen terbinafine (LAMISIL) 250 MG tablet Take 250 mg by mouth daily.  . traZODone (DESYREL) 100 MG tablet Take 100 mg by mouth at bedtime.     Allergies: Allergies as of 05/13/2019  . (No Known Allergies)   Past Medical History:  Diagnosis Date  . Arthritis   . Chest pain   . Diverticulitis   . Hypertension   . Stroke [facility])     Family History:   Hypertension-parents and sisters  Rheumatoid arthritis: Mother  Social History:   Lives in assisted living facility  alone. She is currently staying with daughter  Drinks wine occasionally  No smoking or drug use   Review of Systems: A complete ROS was negative except as per HPI.   Physical Exam: Blood pressure 103/61, pulse 60, temperature (!) 97.4 F (36.3 C), temperature source Oral, resp. rate 17, SpO2 100 %.  Physical Exam  Constitutional: She is oriented to person, place, and time. She appears well-developed and well-nourished. No distress.  HENT:  Head: Normocephalic and atraumatic.  Eyes: Conjunctivae are normal.  Cardiovascular: Normal rate, regular rhythm and normal heart sounds.  Respiratory: Effort normal and breath sounds normal. No respiratory distress. She has no wheezes.  GI: Soft. Bowel sounds are normal. She exhibits no distension. There is no abdominal tenderness.  Musculoskeletal:        General: Edema (2+ pitting  bilaterally) present. No tenderness.  Neurological: She is alert and oriented to person, place, and time. No cranial nerve deficit.  Sensation reduced on left side in comparison to right. 4/5 strength in left upper extremity, 5/5 strength in right upper extremity. Left proximal lower extremity 1/5 and distal 3/5, right proximal lower extremity 2/5 and distal 4/5  Skin: She is not diaphoretic. No erythema.  Psychiatric: She has a normal mood and affect. Her behavior is normal. Judgment and thought content normal.   Labs: CBC: Hemoglobin 7.8, hematocrit 25, WBC 6.4, platelets 234 CMP: NA 139, K4.2, BUN 15, CR 1.76, AST 31, ALT 18, ALP 79, T bili 0.5 FOBT negative UA negative nitrite, leukocytes, ketones COVID-19 pending Lactic acid 1.6>1.2  CT head without contrast Read 05/13/2019 Stable atrophy with periventricular small vessel disease, no acute infarct, no mass, no hemorrhage, foci of arterial vascular calcification mucosal thickening in ethmoid air cells with nasal septal deviation.  EKG: personally reviewed my interpretation is normal sinus rhythm without any significant ST or T wave changes.  CXR: pending  Assessment & Plan by Problem: Principal Problem:   Weakness of both lower extremities Active Problems:   Acute on chronic renal failure [facility])  Ms. Birch is a 68 y.o female with htn, hx of right lacunar infarct, cmv colitis, and immunocompromised from being on RA medication who presented with acute lower extremity weakness, slurred speech, and new onset aki.   Acute on chronic focal symmetric lower extremity weakness  HX of small right lacunar infarcts May 2020 Patient has localized symmetric weakness (given underlying pre-existing left sided deficit from cva in May 2020) in bilateral lower extremities for the past day. Per patient, she has been able to walk slowly on her own after being discharged from [facility]. The weakness seems to be more prominent proximally without any specific  pattern. CT scan did not show any new acute intracranial abnormalities.   May need to consider MRI for new stroke, EMG/nerve conduction study and consult neurology. Cause of such localized bilateral weakness can be due to myopathy, neuropathy or deconditioning, less likely muscular dystrophy, myasthenia gravis.   No elevation of aminotransferases. Will order other inflammatory markers and rheumatologic workup.  -May benefit from nerve conduction or emg -Consulted neurology -Pending creatinine kinase, aldolase, ldh, anti jo, anti ro, anti rnp, anti sm -continue gabapentin 300mg  tid -PT and OT therapy  -continue plavix (not aspirin as it can be more ulcergenic) -continue colestipol 1g bid  Acute on chronic kidney Disease Patient's creatinine is 1.76 which is increased from baseline 1.2-1.3. Patient has already gotten IVF so did not get urine studies to calculate FeNa. Will give IVF  -LR  bolus followed by maintenance -Continue calcium carbonate grams of elemental calcium daily -holding lasix   Hypotension Patient's blood pressure is ranging 70-100/50-80. Patient is afebrile, UA without nitrites or leukocytes. No signficant end organ damage other than aki. LA is within normal range. Will order CXR.   At home the patient takes amlodipine 10 mg daily, clonidine 0.3 mg daily, lisinopril 40mg  qd, metoprolol 100 mg twice daily.  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 [facility] during [facility]. Patient was given a course of IV ganciclovir starting 7/8 and transitioned to po valganciclovir along with prednisone taper. Patient is currently asymptomatic and hb stable at 7.8 which is what she has been ranging [facility].   -pending pcr cmv quantitative -continue valganciclovir  450mg  two tablets in the morning -repeat flexible sigmoidoscopy in October 2020  Rheumatoid arthritis Sjogrens Syndrome Was diagnosed in 2006. Has been on plaquanil since 2010. Patient states that she usually has swelling in her bilateral ankles, but does not have any tenderness during this hospitalization. She feels that her ankle swelling has improved more than usual.   [facility] and they recommended possible Remicade versus Humira.  -Continue hydroxychloroquine 200 mg twice daily -F/u with Dr. Dierdre ForthBeekman on 05/16/19 -Hold etanercept  Major Depressive Disorder   -Continue duloxetine 30mg  tid  -Continue trazodone 100mg  qhs  -holding hydrozyzine  Chronic bilateral low back pain without sciatica  -continue Gabapentin 300mg  tid   Dispo: Admit patient to Observation with expected length of stay less than 2 midnights.  SignedLorenso Courier: Paulla Mcclaskey, MD 05/13/2019, 2:03 PM  Pager: [phone]

## 2019-05-13 NOTE — Progress Notes (Signed)
NEW ADMISSION NOTE New Admission Note:   Arrival Method: from ED via stretcher  Mental Orientation: axox4 Telemetry: box 9  Assessment: Completed Skin: see assessment  IV:LUA and LFA Pain:denies  Tubes:none  Safety Measures: Safety Fall Prevention Plan has been discussed  Admission: pending  [facility] [place] Orientation: Patient has been orientated to the room, unit and staff.  Family: none   Orders have been reviewed and implemented. Will continue to monitor the patient. Call light has been placed within reach and bed alarm has been activated.   Paulla Fore, RN

## 2019-05-13 NOTE — ED Provider Notes (Signed)
  Provider Note MRN:  [id number]  Arrival date & time: 05/13/19    ED Course and Medical Decision Making  Assumed care from Dr. Dina Rich at shift change.  History of stroke, history of GI bleed here with hypotension, there is an initial concern for slurred speech but patient explains that she did not have her dentures in.  Patient is responding to fluids here, hemoglobin is downtrending, Hemoccult is negative.  Patient exhibits no infectious symptoms, afebrile here, favoring hypovolemia of the cause of the hypotension.  Planning for admission, awaiting urinalysis, CT head.  9:30 AM update: CT head is with no acute findings.  Still awaiting urinalysis.  Received a call from family, who requests transfer to [facility] hospital for continuity of care given that she was just discharged from that hospital recently.  Spoke with transfer center, awaiting confirmation.  10:20 AM update: Spoke with [facility] hospitalist, who feels this is an appropriate transfer but there is no capacity at [facility], many patients waiting in the emergency department for beds.  Hospitals there reassures [place] that her hemoglobin levels at discharge are similar to today, and with her negative Hemoccult there is likely not an active GI bleed, making her a more appropriate admission to the [facility] service for dehydration and AKI today.  Spoke with family about this, who agrees with admission here at [facility].  Critical Care Documentation Critical care time provided by me (excluding procedures): 35 minutes  Condition necessitating critical care: Hypotension, hypovolemia  Components of critical care management: reviewing of prior records, laboratory and imaging interpretation, frequent re-examination and reassessment of vital signs, administration of IV fluids, discussion with consulting services.    Final Clinical Impressions(s) / ED Diagnoses     ICD-10-CM   1. Dehydration  E86.0   2. AKI (acute kidney injury) [facility])  N17.9     ED  Discharge Orders    None       Barth Kirks. [place] Small, [place] [email or web address]    Maudie Flakes, MD 05/13/19 1025

## 2019-05-13 NOTE — CV Procedure (Deleted)
NEURO HOSPITALIST CONSULT NOTE   Requesting physician: Dr. Rogelia BogaButcher  Reason for Consult: Bilateral lower extremity weakness  History obtained from:   Patient and Chart    HPI:                                                                                                                                          Michelle Graves is a 68 y.o. female presenting s/p fall with new onset of BLE weakness. This is on a background of pre-existing left sided weakness from a stroke in May of this year (two small acute lacunar infarctions). PT notes from her May admission document generalized weakness with decreased activity tolerance at that time. She was diagnosed with acute blood loss anemia that admission in the setting of black emesis, gastric ulcers on EGD and hemorrhagic changes in her sigmoid colon on colonoscopy. She was discharged to a rehab facility for one week and subsequently discharged to stay at her daughter's house. Of note, she has had significantly decreased PO intake over approximately the past 3 weeks.   On 7/15 she was trying to walk to the bathroom without her usual assistive devices; her legs gave out and her daughter, who was beside her, slowly guided her to the floor. The patient was then unable to stand back up by herself.  [facility] then [facility].   Neurology was consulted for evaluation of the etiology of the new weakness, with possible MRI brain, MRI L-spine and EMG/NCS.   MRI brain was completed, revealing an acute subcentimeter white matter infarct in the right frontal lobe. Severe chronic small vessel ischemia, chronic lacunar infarcts and numerous chronic microhemorrhages suggesting chronic hypertension were also noted.  Past Medical History:  Diagnosis Date  . Arthritis   . Chest pain   . Diverticulitis   . Hypertension   . Stroke [facility](HCC)     Past Surgical History:  Procedure Laterality Date  . BIOPSY  04/01/2019   Procedure:  BIOPSY;  Surgeon: Kathi DerBrahmbhatt, Parag, MD;  Location: [facility];  Service: Gastroenterology;;  . COLONOSCOPY WITH PROPOFOL N/A 04/01/2019   Procedure: COLONOSCOPY WITH PROPOFOL;  Surgeon: Kathi DerBrahmbhatt, Parag, MD;  Location: [facility];  Service: Gastroenterology;  Laterality: N/A;  . ESOPHAGOGASTRODUODENOSCOPY (EGD) WITH PROPOFOL N/A 04/01/2019   Procedure: ESOPHAGOGASTRODUODENOSCOPY (EGD) WITH PROPOFOL;  Surgeon: Kathi DerBrahmbhatt, Parag, MD;  Location: [facility];  Service: Gastroenterology;  Laterality: N/A;  . TONSILLECTOMY      Family History  Problem Relation Age of Onset  . Hypertension Mother   . Diabetes Mother   . CAD Father        died of MI at age 66  . Hypertension Father   . Diabetes Sister   . Diabetes Sister   . Kidney  disease Neg Hx               Social History:  reports that she has never smoked. She has never used smokeless tobacco. She reports current alcohol use of about 21.0 standard drinks of alcohol per week. She reports that she does not use drugs.  No Known Allergies  HOME MEDICATIONS:                                                                                                                      No current facility-administered medications on file prior to encounter.    Current Outpatient Medications on File Prior to Encounter  Medication Sig Dispense Refill  . amLODipine (NORVASC) 10 MG tablet Take 10 mg by mouth daily.    . calcium carbonate (OSCAL) 1500 (600 Ca) MG TABS tablet Take 600 mg of elemental calcium by mouth daily.    . Carboxymethylcellul-Glycerin (CLEAR EYES FOR DRY EYES OP) Place 2 drops into both eyes daily as needed (dry eyes).    . cloNIDine (CATAPRES) 0.3 MG tablet Take 0.3 mg by mouth daily.    . clopidogrel (PLAVIX) 75 MG tablet Take 1 tablet (75 mg total) by mouth daily. 30 tablet 0  . furosemide (LASIX) 40 MG tablet Take 1 tablet (40 mg total) by mouth daily. 30 tablet 0  . hydroxychloroquine (PLAQUENIL) 200 MG tablet Take 200 mg by  mouth 2 (two) times daily.   0  . metoprolol tartrate (LOPRESSOR) 100 MG tablet Take 100 mg by mouth 2 (two) times daily.     . Misc Natural Products (OSTEO BI-FLEX JOINT SHIELD PO) Take 1 tablet by mouth daily.    . Multiple Vitamins-Minerals (CENTRUM SILVER 50+WOMEN PO) Take 1 tablet by mouth daily.    . naproxen (NAPROSYN) 500 MG tablet Take 500 mg by mouth daily as needed for mild pain.     Marland Kitchen ondansetron (ZOFRAN) 4 MG tablet Take 1 tablet (4 mg total) by mouth every 6 (six) hours as needed for nausea. 20 tablet 0  . pantoprazole (PROTONIX) 40 MG tablet Take 1 tablet (40 mg total) by mouth 2 (two) times daily. 60 tablet 0  . terbinafine (LAMISIL) 250 MG tablet Take 250 mg by mouth daily.    . traZODone (DESYREL) 100 MG tablet Take 100 mg by mouth at bedtime.        ROS:  As per HPI. Does not endorse additional neurological complaints, including no vision loss, facial droop, confusion, speech changes or seizures.    Blood pressure 111/71, pulse 66, temperature 97.9 F (36.6 C), temperature source Oral, resp. rate 20, height 5' 6.5" (1.689 m), weight 89.9 kg, SpO2 100 %.   General Examination:                                                                                                       Physical Exam  HEENT-  Keo/AT   Lungs- Respirations unlabored Extremities- Warm and well pefused  Neurological Examination Mental Status: Alert, fully oriented, thought content appropriate.  Speech fluent without evidence of aphasia.  Able to follow all commands without difficulty. Cranial Nerves: II: Visual fields intact with no extinction to DSS. PERRL.   III,IV, VI: No ptosis. EOMI.  V,VII: No facial droop. Temp sensation equal bilaterally  VIII: hearing intact to voice IX,X: No hoarseness XI: Symmetric XII: Midline tongue extension Motor: BUE 4+/5  proximally and distally without asymmetry.  LLE 2/5 hip flexion, 4-/5 knee extension and knee flexion. 4/5 ADF and APF RLE 4-/5 hip flexion, 4/5 knee extension and flexion, 4+/5 ADF/APF Sensory:  Upper extremities: Intact to temp and FT bilaterally without extinction.  Lower extremities:Temp and light touch intact to bilateral lower extremities throughout and symmetrically, with no dermatomal abnormalities. Toe proprioception is moderately impaired bilaterally. Deep Tendon Reflexes:  1+ bilateral brachioradialis and biceps 0 patellae bilaterally Trace achilles bilaterally Toes downgoing bilaterally  Cerebellar: No ataxia with FNF bilaterally  Gait: Unable to assess   Lab Results: Basic Metabolic Panel: Recent Labs  Lab 05/13/19 0455  NA 139  K 4.2  CL 106  CO2 23  GLUCOSE 96  BUN 15  CREATININE 1.76*  CALCIUM 8.3*    CBC: Recent Labs  Lab 05/13/19 0455  WBC 6.4  NEUTROABS 4.6  HGB 7.8*  HCT 25.5*  MCV 101.2*  PLT 234    Cardiac Enzymes: Recent Labs  Lab 05/13/19 1530  CKTOTAL 40    Lipid Panel: No results for input(s): CHOL, TRIG, HDL, CHOLHDL, VLDL, LDLCALC in the last 168 hours.  Imaging: Dg Chest 2 View  Result Date: 05/13/2019 CLINICAL DATA:  Generalized weakness today. EXAM: CHEST - 2 VIEW COMPARISON:  Single-view of the chest 03/30/2019. PA and lateral chest 11/21/2018. FINDINGS: The lungs are clear. There is cardiomegaly. No pneumothorax or pleural fluid. IMPRESSION: Cardiomegaly without acute disease. Electronically Signed   By: Drusilla Kanner M.D.   On: 05/13/2019 16:45   Ct Head Wo Contrast  Result Date: 05/13/2019 CLINICAL DATA:  Chronic left-sided deficits with altered mental status EXAM: CT HEAD WITHOUT CONTRAST TECHNIQUE: Contiguous axial images were obtained from the base of the skull through the vertex without intravenous contrast. COMPARISON:  March 29, 2019 FINDINGS: Brain: Mild diffuse atrophy is stable. There is no intracranial mass,  hemorrhage, extra-axial fluid collection, or midline shift. There is extensive small vessel disease in the centra semiovale bilaterally, stable. No acute infarct is demonstrable on this current examination. Vascular: There is no hyperdense  vessel. There is calcification in each carotid siphon region. Skull: Bony calvarium appears intact. Sinuses/Orbits: There is mucosal thickening in several ethmoid air cells. Other paranasal sinuses are clear. There is rightward deviation of the nasal septum. Orbits appear symmetric bilaterally. Other: Mastoid air cells are clear. IMPRESSION: Stable atrophy with periventricular small vessel disease. No acute infarct evident. No mass or hemorrhage. There are foci of arterial vascular calcification. There is mucosal thickening in several ethmoid air cells. There is nasal septal deviation. Electronically Signed   By: Lowella Grip III M.D.   On: 05/13/2019 08:24    Assessment: 68 year old female presenting with worsened BLE weakness on a pre-existing left sided deficit from stroke in May 2020. 1. Weakness is proximal > distal in BLE. There is no muscle tenderness to palpation or muscle wasting, and CK level is normal, making a myositis unlikely. Her weakness is present all day without diurnal variation, bulbar symptoms or SOB, making myasthenia gravis unlikely. 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 See below (#4) for additional DDx.  2. CT head shows not acute infarct. Stable atrophy and chronic small vessel ischemic changes are noted.  3. MRI brain reveals an acute subcentimeter white matter infarct in the right frontal lobe. Severe chronic small vessel ischemia,  chronic lacunar infarcts and numerous chronic microhemorrhages suggesting chronic hypertension are also noted. The tiny acute infarction is not sufficient to explain the patient's new symptoms of BLE weakness.  4. DDx for the new component of the patient's weakness includes, compressive myelopathy, neurogenic claudication (has chronic low back pain), myopathy, neuropathy and CIDP or AIDP. Most likely underlying deconditioning is contributing to her presentation. Immunocompromised from RA medication, which somewhat increases the risk of an epidural abscess; however, she is currently afebrile. Failure to thrive also a likely contributing factor. She has had significantly decreased PO intake over approximately the past 3 weeks.  5. Other medical conditions: CMV colitis, RA and Sjogren's syndrome   Recommendations: 1. Agree with obtaining creatinine kinase, aldolase, LDH, anti jo, anti ro, anti rnp, anti smith antibody. 2. IVF for elevated AKI. With improved hydration there may be an improvement in her strength. Also will help with hypotension, which may be contributing to her weakness.  3. PT/OT 4. Agree with Plavix for secondary stroke prevention 5. MRI of thoracic and lumbar spine without contrast (has low GFR therefore contrast is contraindicated). If no cord lesion, compressive myelopathy or compression of cauda equina is seen, then may need a spinal tap to assess for possible CIDP or AIDP, which would be higher on the DDx if no structural lesion can be found to explain her weakness. Spine MRIs have been ordered.  6. Will need outpatient EMG/NCS within 1-2 weeks of discharge if above work up does not yield and explanation.  7. CK level.  8. Anti-VGCC antibody level to assess for possible LEMS (ordered)   Electronically signed: Dr. Kerney Elbe 05/13/2019, 7:17 PM

## 2019-05-13 NOTE — Progress Notes (Signed)
PT Cancellation Note  Patient Details Name: Michelle Graves MRN: [id number] DOB: 1951-05-18   Cancelled Treatment:    Reason Eval/Treat Not Completed: Fatigue/lethargy limiting ability to participate.  Has just arrived from ED so asks to wait to tomorrow.  Will reattempt then.   Ramond Dial 05/13/2019, 2:38 PM   Mee Hives, PT MS Acute Rehab Dept. Number: [place]

## 2019-05-13 NOTE — ED Notes (Signed)
Michelle Graves-- [phone] Updated per Dr. Sedonia Small

## 2019-05-13 NOTE — ED Provider Notes (Signed)
[facility] Provider Note   CSN: [id number] Arrival date & time: 05/13/19  0431     History   Chief Complaint Chief Complaint  Patient presents with  . Weakness    HPI Michelle Graves is a 68 y.o. female.     HPI  This is a 68 year old female with a history of CVA, AKI, hypertension who presents with generalized weakness.  Patient reports that she got up to go to the bathroom and slid out of her bed.  She was unable to get up off the floor because of weakness.  She reports residual left-sided weakness following a CVA in May.  She recently was admitted to the hospital for colitis.  She reports that she has been home and doing well.  She denies any physical complaints to me.  Per EMS, family reported some slurred speech that improved earlier this evening.  Patient reports slurred speech was related to not having her teeth in.  Patient denies any fever, cough, shortness of breath, chest pain, abdominal pain, nausea, vomiting or diarrhea.  Of note, she was noted to be hypotensive by EMS.  Patient denies any dizziness.  Of note, patient with recent mission for GI bleed as well as ischemic stroke.  She is currently on Plavix.  GI bleed was related to pancolitis for which she received antibiotics.  Past Medical History:  Diagnosis Date  . Arthritis   . Chest pain   . Diverticulitis   . Hypertension     Patient Active Problem List   Diagnosis Date Noted  . Cerebral thrombosis with cerebral infarction 03/31/2019  . New onset a-fib (HCC) 03/27/2019  . Acute kidney injury superimposed on CKD (HCC) 03/24/2019  . Hypotension 03/24/2019  . Dark emesis 03/24/2019  . Dark stools 03/24/2019  . Diverticulitis of intestine without perforation or abscess without bleeding   . Sinus tachycardia 08/27/2016  . Diverticulitis 08/26/2016  . HYPERTENSION, MALIGNANT ESSENTIAL 06/24/2007  . KIDNEY DISEASE, CHRONIC, STAGE III 06/24/2007  . ARTHRITIS,  RHEUMATOID, SEROPOSITIVE 06/24/2007    Past Surgical History:  Procedure Laterality Date  . BIOPSY  04/01/2019   Procedure: BIOPSY;  Surgeon: Kathi DerBrahmbhatt, Parag, MD;  Location: [facility];  Service: Gastroenterology;;  . COLONOSCOPY WITH PROPOFOL N/A 04/01/2019   Procedure: COLONOSCOPY WITH PROPOFOL;  Surgeon: Kathi DerBrahmbhatt, Parag, MD;  Location: [facility];  Service: Gastroenterology;  Laterality: N/A;  . ESOPHAGOGASTRODUODENOSCOPY (EGD) WITH PROPOFOL N/A 04/01/2019   Procedure: ESOPHAGOGASTRODUODENOSCOPY (EGD) WITH PROPOFOL;  Surgeon: Kathi DerBrahmbhatt, Parag, MD;  Location: [facility];  Service: Gastroenterology;  Laterality: N/A;  . TONSILLECTOMY       OB History   No obstetric history on file.      Home Medications    Prior to Admission medications   Medication Sig Start Date End Date Taking? Authorizing Provider  calcium carbonate (OSCAL) 1500 (600 Ca) MG TABS tablet Take 600 mg of elemental calcium by mouth daily.    [provider]  Carboxymethylcellul-Glycerin (CLEAR EYES FOR DRY EYES OP) Place 2 drops into both eyes daily as needed (dry eyes).    [provider]  clopidogrel (PLAVIX) 75 MG tablet Take 1 tablet (75 mg total) by mouth daily. 04/05/19   Glade LloydAlekh, Kshitiz, MD  ENBREL SURECLICK 50 MG/ML injection Inject 50 mg into the muscle once a week. 02/28/19   [provider]  furosemide (LASIX) 40 MG tablet Take 1 tablet (40 mg total) by mouth daily. 04/05/19   Glade LloydAlekh, Kshitiz, MD  hydroxychloroquine (PLAQUENIL)  200 MG tablet Take 200 mg by mouth 2 (two) times daily.  06/16/16   [provider]  metoprolol tartrate (LOPRESSOR) 100 MG tablet Take 100 mg by mouth 2 (two) times daily.  11/19/15   [provider]  Misc Natural Products (OSTEO BI-FLEX JOINT SHIELD PO) Take 1 tablet by mouth daily.    [provider]  Multiple Vitamins-Minerals (CENTRUM SILVER 50+WOMEN PO) Take 1 tablet by mouth daily.    [provider]  naproxen  (NAPROSYN) 500 MG tablet Take 500 mg by mouth daily.  03/15/18   [provider]  ondansetron (ZOFRAN) 4 MG tablet Take 1 tablet (4 mg total) by mouth every 6 (six) hours as needed for nausea. 04/04/19   Aline August, MD  pantoprazole (PROTONIX) 40 MG tablet Take 1 tablet (40 mg total) by mouth 2 (two) times daily. 04/04/19   Aline August, MD  terbinafine (LAMISIL) 250 MG tablet Take 250 mg by mouth daily. 03/15/18   [provider]  traZODone (DESYREL) 100 MG tablet Take 100 mg by mouth at bedtime.  03/01/19   [provider]    Family History Family History  Problem Relation Age of Onset  . Hypertension Mother   . Diabetes Mother   . CAD Father        died of MI at age 12  . Hypertension Father   . Diabetes Sister   . Diabetes Sister   . Kidney disease Neg Hx     Social History Social History   Tobacco Use  . Smoking status: Never Smoker  . Smokeless tobacco: Never Used  Substance Use Topics  . Alcohol use: Yes    Alcohol/week: 21.0 standard drinks    Types: 21 Glasses of wine per week    Comment: sometimes   . Drug use: No     Allergies   Patient has no known allergies.   Review of Systems Review of Systems  Constitutional: Negative for fever.  Respiratory: Negative for shortness of breath.   Cardiovascular: Negative for chest pain.  Gastrointestinal: Negative for abdominal pain, diarrhea, nausea and vomiting.  Genitourinary: Negative for dysuria.  Musculoskeletal: Negative for gait problem.  Neurological: Positive for weakness. Negative for numbness.  All other systems reviewed and are negative.    Physical Exam Updated Vital Signs BP (!) 80/61 (BP Location: Left Arm)   Pulse 70   Temp 98.8 F (37.1 C) (Rectal)   Resp (!) 24   SpO2 99%   Physical Exam Vitals signs and nursing note reviewed.  Constitutional:      Appearance: She is well-developed.  HENT:     Head: Normocephalic and atraumatic.     Mouth/Throat:     Mouth:  Mucous membranes are dry.  Eyes:     Pupils: Pupils are equal, round, and reactive to light.  Neck:     Musculoskeletal: Neck supple.  Cardiovascular:     Rate and Rhythm: Normal rate and regular rhythm.     Heart sounds: Normal heart sounds.  Pulmonary:     Effort: Pulmonary effort is normal. No respiratory distress.     Breath sounds: No wheezing.  Abdominal:     General: Bowel sounds are normal.     Palpations: Abdomen is soft.     Tenderness: There is no abdominal tenderness. There is no rebound.  Musculoskeletal:     Left lower leg: Edema present.  Skin:    General: Skin is warm and dry.  Neurological:  Mental Status: She is alert and oriented to person, place, and time.     Comments: 5 out of 5 strength bilateral upper extremities, 4+ out of 5 left lower extremity, no dysmetria to finger-nose-finger  Psychiatric:        Mood and Affect: Mood normal.      ED Treatments / Results  Labs (all labs ordered are listed, but only abnormal results are displayed) Labs Reviewed  CBC WITH DIFFERENTIAL/PLATELET  COMPREHENSIVE METABOLIC PANEL  URINALYSIS, ROUTINE W REFLEX MICROSCOPIC  LACTIC ACID, PLASMA  LACTIC ACID, PLASMA    EKG EKG Interpretation  Date/Time:  Friday May 13 2019 04:41:05 EDT Ventricular Rate:  69 PR Interval:    QRS Duration: 94 QT Interval:  455 QTC Calculation: 488 R Axis:   12 Text Interpretation:  Sinus rhythm Borderline prolonged QT interval Confirmed by Ross Marcus ([id number]) on 05/13/2019 4:43:30 AM   Radiology No results found.  Procedures Procedures (including critical care time)  Medications Ordered in ED Medications  sodium chloride 0.9 % bolus 1,000 mL (has no administration in time range)  sodium chloride 0.9 % bolus 1,000 mL (1,000 mLs Intravenous New Bag/Given 05/13/19 0458)     Initial Impression / Assessment and Plan / ED Course  I have reviewed the triage vital signs and the nursing notes.  Pertinent labs & imaging  results that were available during my care of the patient were reviewed by me and considered in my medical decision making (see chart for details).       Patient presents with generalized weakness.  Reports that she was unable to get up off the floor this morning.  There was some report of speech difficulty; however, the patient states that she did not have her teeth N.  She does not appear to have a focal neurologic deficit on exam.  Vital signs are most concerning for low blood pressure.  Systolic blood pressure mostly in the 80s.  Reported history of hypertension.  However, on previous admission, patient had episodes of hypotension in the setting of her colitis.  She is not febrile and has no infectious symptoms.  No significant leukocytosis.  Doubt sepsis.  She does clinically appear somewhat dry.  Patient was given 2 L of fluid and is fluid responsive.  Lactate is normal.  CT head and urinalysis are pending.  Suspect she will need admission for further evaluation.  Final Clinical Impressions(s) / ED Diagnoses   Final diagnoses:  Dehydration  AKI (acute kidney injury) (HCC)  Hypotension    ED Discharge Orders    None       Wilkie Aye, Mayer Masker, MD 05/14/19 0131

## 2019-05-14 ENCOUNTER — Inpatient Hospital Stay (HOSPITAL_COMMUNITY): Payer: Medicare Other

## 2019-05-14 DIAGNOSIS — R29898 Other symptoms and signs involving the musculoskeletal system: Secondary | ICD-10-CM

## 2019-05-14 LAB — BASIC METABOLIC PANEL
Anion gap: 8 (ref 5–15)
BUN: 13 mg/dL (ref 8–23)
CO2: 20 mmol/L — ABNORMAL LOW (ref 22–32)
Calcium: 7.4 mg/dL — ABNORMAL LOW (ref 8.9–10.3)
Chloride: 111 mmol/L (ref 98–111)
Creatinine, Ser: 1.46 mg/dL — ABNORMAL HIGH (ref 0.44–1.00)
GFR calc Af Amer: 43 mL/min — ABNORMAL LOW (ref >60)
GFR calc non Af Amer: 37 mL/min — ABNORMAL LOW (ref >60)
Glucose, Bld: 76 mg/dL (ref 70–99)
Potassium: 3.7 mmol/L (ref 3.5–5.1)
Sodium: 139 mmol/L (ref 135–145)

## 2019-05-14 LAB — CBC
HCT: 20.1 % — ABNORMAL LOW (ref 36.0–46.0)
HCT: 23.7 % — ABNORMAL LOW (ref 36.0–46.0)
Hemoglobin: 6.5 g/dL — CL (ref 12.0–15.0)
Hemoglobin: 8 g/dL — ABNORMAL LOW (ref 12.0–15.0)
MCH: 31.3 pg (ref 26.0–34.0)
MCH: 30.9 pg (ref 26.0–34.0)
MCHC: 32.3 g/dL (ref 30.0–36.0)
MCHC: 33.8 g/dL (ref 30.0–36.0)
MCV: 96.6 fL (ref 80.0–100.0)
MCV: 91.5 fL (ref 80.0–100.0)
Platelets: 220 10*3/uL (ref 150–400)
Platelets: 250 10*3/uL (ref 150–400)
RBC: 2.08 MIL/uL — ABNORMAL LOW (ref 3.87–5.11)
RBC: 2.59 MIL/uL — ABNORMAL LOW (ref 3.87–5.11)
RDW: 18.7 % — ABNORMAL HIGH (ref 11.5–15.5)
RDW: 19.8 % — ABNORMAL HIGH (ref 11.5–15.5)
WBC: 4 10*3/uL (ref 4.0–10.5)
WBC: 4.4 10*3/uL (ref 4.0–10.5)
nRBC: 0 % (ref 0.0–0.2)
nRBC: 0 % (ref 0.0–0.2)

## 2019-05-14 LAB — FERRITIN: Ferritin: 400 ng/mL — ABNORMAL HIGH (ref 11–307)

## 2019-05-14 LAB — ALDOLASE: Aldolase: 7.1 U/L (ref 3.3–10.3)

## 2019-05-14 LAB — PREPARE RBC (CROSSMATCH)

## 2019-05-14 LAB — SJOGRENS SYNDROME-B EXTRACTABLE NUCLEAR ANTIBODY: SSB (La) (ENA) Antibody, IgG: <0.2 AI (ref 0.0–0.9)

## 2019-05-14 LAB — VITAMIN B12: Vitamin B-12: 1242 pg/mL — ABNORMAL HIGH (ref 180–914)

## 2019-05-14 LAB — IRON AND TIBC
Iron: 16 ug/dL — ABNORMAL LOW (ref 28–170)
Saturation Ratios: 8 % — ABNORMAL LOW (ref 10.4–31.8)
TIBC: 200 ug/dL — ABNORMAL LOW (ref 250–450)
UIBC: 184 ug/dL

## 2019-05-14 LAB — TSH: TSH: 0.902 u[IU]/mL (ref 0.350–4.500)

## 2019-05-14 LAB — SJOGRENS SYNDROME-A EXTRACTABLE NUCLEAR ANTIBODY: SSA (Ro) (ENA) Antibody, IgG: >8 AI — ABNORMAL HIGH (ref 0.0–0.9)

## 2019-05-14 LAB — HIV ANTIBODY (ROUTINE TESTING W REFLEX): HIV Screen 4th Generation wRfx: NONREACTIVE

## 2019-05-14 MED ORDER — SODIUM CHLORIDE 0.9% IV SOLUTION
Freq: Once | INTRAVENOUS | Status: AC
  Administered 2019-05-14: 12:00:00 via INTRAVENOUS

## 2019-05-14 MED ORDER — FERROUS SULFATE 325 (65 FE) MG PO TABS
325.0000 mg | ORAL_TABLET | Freq: Every day | ORAL | Status: DC
  Administered 2019-05-15 – 2019-05-16 (×2): 325 mg via ORAL
  Filled 2019-05-14 (×2): qty 1

## 2019-05-14 MED ORDER — ADULT MULTIVITAMIN W/MINERALS CH
1.0000 | ORAL_TABLET | Freq: Every day | ORAL | Status: DC
  Administered 2019-05-14 – 2019-05-16 (×3): 1 via ORAL
  Filled 2019-05-14 (×3): qty 1

## 2019-05-14 MED ORDER — ENSURE ENLIVE PO LIQD
237.0000 mL | ORAL | Status: DC
  Administered 2019-05-14 – 2019-05-15 (×2): 237 mL via ORAL

## 2019-05-14 NOTE — Consult Note (Signed)
NEURO HOSPITALIST CONSULT NOTE   Requesting physician: Dr. Rogelia BogaButcher  Reason for Consult: Bilateral lower extremity weakness  History obtained from:   Patient and Chart    HPI:                                                                                                                                          Michelle Graves is a 68 y.o. female presenting s/p fall with new onset of BLE weakness. This is on a background of pre-existing left sided weakness from a stroke in May of this year (two small acute lacunar infarctions). PT notes from her May admission document generalized weakness with decreased activity tolerance at that time. She was diagnosed with acute blood loss anemia that admission in the setting of black emesis, gastric ulcers on EGD and hemorrhagic changes in her sigmoid colon on colonoscopy. She was discharged to a rehab facility for one week and subsequently discharged to stay at her daughter's house. Of note, she has had significantly decreased PO intake over approximately the past 3 weeks.   On 7/15 she was trying to walk to the bathroom without her usual assistive devices; her legs gave out and her daughter, who was beside her, slowly guided her to the floor. The patient was then unable to stand back up by herself.  She was then [facility].   Neurology was consulted for evaluation of the etiology of the new weakness, with possible MRI brain, MRI L-spine and EMG/NCS.   MRI brain was completed, revealing an acute subcentimeter white matter infarct in the right frontal lobe. Severe chronic small vessel ischemia, chronic lacunar infarcts and numerous chronic microhemorrhages suggesting chronic hypertension were also noted.  Past Medical History:  Diagnosis Date  . Arthritis   . Chest pain   . Diverticulitis   . Hypertension   . Stroke [facility])     Past Surgical History:  Procedure Laterality Date  . BIOPSY  04/01/2019   Procedure:  BIOPSY;  Surgeon: Kathi DerBrahmbhatt, Parag, MD;  Location: [facility] ENDOSCOPY;  Service: Gastroenterology;;  . COLONOSCOPY WITH PROPOFOL N/A 04/01/2019   Procedure: COLONOSCOPY WITH PROPOFOL;  Surgeon: Kathi DerBrahmbhatt, Parag, MD;  Location: [facility] ENDOSCOPY;  Service: Gastroenterology;  Laterality: N/A;  . ESOPHAGOGASTRODUODENOSCOPY (EGD) WITH PROPOFOL N/A 04/01/2019   Procedure: ESOPHAGOGASTRODUODENOSCOPY (EGD) WITH PROPOFOL;  Surgeon: Kathi DerBrahmbhatt, Parag, MD;  Location: [facility] ENDOSCOPY;  Service: Gastroenterology;  Laterality: N/A;  . TONSILLECTOMY      Family History  Problem Relation Age of Onset  . Hypertension Mother   . Diabetes Mother   . CAD Father        died of MI at age 66  . Hypertension Father   . Diabetes Sister   . Diabetes Sister   . Kidney  disease Neg Hx               Social History:  reports that she has never smoked. She has never used smokeless tobacco. She reports current alcohol use of about 21.0 standard drinks of alcohol per week. She reports that she does not use drugs.  No Known Allergies  HOME MEDICATIONS:                                                                                                                      No current facility-administered medications on file prior to encounter.    Current Outpatient Medications on File Prior to Encounter  Medication Sig Dispense Refill  . amLODipine (NORVASC) 10 MG tablet Take 10 mg by mouth daily.    . calcium carbonate (OSCAL) 1500 (600 Ca) MG TABS tablet Take 600 mg of elemental calcium by mouth daily.    . Carboxymethylcellul-Glycerin (CLEAR EYES FOR DRY EYES OP) Place 2 drops into both eyes daily as needed (dry eyes).    . cloNIDine (CATAPRES) 0.3 MG tablet Take 0.3 mg by mouth daily.    . clopidogrel (PLAVIX) 75 MG tablet Take 1 tablet (75 mg total) by mouth daily. 30 tablet 0  . furosemide (LASIX) 40 MG tablet Take 1 tablet (40 mg total) by mouth daily. 30 tablet 0  . hydroxychloroquine (PLAQUENIL) 200 MG tablet Take 200 mg by  mouth 2 (two) times daily.   0  . metoprolol tartrate (LOPRESSOR) 100 MG tablet Take 100 mg by mouth 2 (two) times daily.     . Misc Natural Products (OSTEO BI-FLEX JOINT SHIELD PO) Take 1 tablet by mouth daily.    . Multiple Vitamins-Minerals (CENTRUM SILVER 50+WOMEN PO) Take 1 tablet by mouth daily.    . naproxen (NAPROSYN) 500 MG tablet Take 500 mg by mouth daily as needed for mild pain.     Marland Kitchen ondansetron (ZOFRAN) 4 MG tablet Take 1 tablet (4 mg total) by mouth every 6 (six) hours as needed for nausea. 20 tablet 0  . pantoprazole (PROTONIX) 40 MG tablet Take 1 tablet (40 mg total) by mouth 2 (two) times daily. 60 tablet 0  . terbinafine (LAMISIL) 250 MG tablet Take 250 mg by mouth daily.    . traZODone (DESYREL) 100 MG tablet Take 100 mg by mouth at bedtime.        ROS:  As per HPI. Does not endorse additional neurological complaints, including no vision loss, facial droop, confusion, speech changes or seizures.    Blood pressure 114/74, pulse 82, temperature 98.3 F (36.8 C), temperature source Oral, resp. rate 18, height 5' 6.5" (1.689 m), weight 89.9 kg, SpO2 100 %.   General Examination:                                                                                                       Physical Exam  HEENT-  [place]/AT   Lungs- Respirations unlabored Extremities- Warm and well pefused  Neurological Examination Mental Status: Alert, fully oriented, thought content appropriate.  Speech fluent without evidence of aphasia.  Able to follow all commands without difficulty. Cranial Nerves: II: Visual fields intact with no extinction to DSS. PERRL.   III,IV, VI: No ptosis. EOMI.  V,VII: No facial droop. Temp sensation equal bilaterally  VIII: hearing intact to voice IX,X: No hoarseness XI: Symmetric XII: Midline tongue extension Motor: BUE 4+/5  proximally and distally without asymmetry.  LLE 2/5 hip flexion, 4-/5 knee extension and knee flexion. 4/5 ADF and APF RLE 4-/5 hip flexion, 4/5 knee extension and flexion, 4+/5 ADF/APF Sensory:  Upper extremities: Intact to temp and FT bilaterally without extinction.  Lower extremities:Temp and light touch intact to bilateral lower extremities throughout and symmetrically, with no dermatomal abnormalities. Toe proprioception is moderately impaired bilaterally. Deep Tendon Reflexes:  1+ bilateral brachioradialis and biceps 0 patellae bilaterally Trace achilles bilaterally Toes downgoing bilaterally  Cerebellar: No ataxia with FNF bilaterally  Gait: Unable to assess   Lab Results: Basic Metabolic Panel: Recent Labs  Lab 05/13/19 0455  NA 139  K 4.2  CL 106  CO2 23  GLUCOSE 96  BUN 15  CREATININE 1.76*  CALCIUM 8.3*    CBC: Recent Labs  Lab 05/13/19 0455  WBC 6.4  NEUTROABS 4.6  HGB 7.8*  HCT 25.5*  MCV 101.2*  PLT 234    Cardiac Enzymes: Recent Labs  Lab 05/13/19 1530  CKTOTAL 40    Lipid Panel: No results for input(s): CHOL, TRIG, HDL, CHOLHDL, VLDL, LDLCALC in the last 168 hours.  Imaging: Dg Chest 2 View  Result Date: 05/13/2019 CLINICAL DATA:  Generalized weakness today. EXAM: CHEST - 2 VIEW COMPARISON:  Single-view of the chest 03/30/2019. PA and lateral chest 11/21/2018. FINDINGS: The lungs are clear. There is cardiomegaly. No pneumothorax or pleural fluid. IMPRESSION: Cardiomegaly without acute disease. Electronically Signed   By: Drusilla Kanner M.D.   On: 05/13/2019 16:45   Ct Head Wo Contrast  Result Date: 05/13/2019 CLINICAL DATA:  Chronic left-sided deficits with altered mental status EXAM: CT HEAD WITHOUT CONTRAST TECHNIQUE: Contiguous axial images were obtained from the base of the skull through the vertex without intravenous contrast. COMPARISON:  March 29, 2019 FINDINGS: Brain: Mild diffuse atrophy is stable. There is no intracranial mass,  hemorrhage, extra-axial fluid collection, or midline shift. There is extensive small vessel disease in the centra semiovale bilaterally, stable. No acute infarct is demonstrable on this current examination. Vascular: There is no hyperdense  vessel. There is calcification in each carotid siphon region. Skull: Bony calvarium appears intact. Sinuses/Orbits: There is mucosal thickening in several ethmoid air cells. Other paranasal sinuses are clear. There is rightward deviation of the nasal septum. Orbits appear symmetric bilaterally. Other: Mastoid air cells are clear. IMPRESSION: Stable atrophy with periventricular small vessel disease. No acute infarct evident. No mass or hemorrhage. There are foci of arterial vascular calcification. There is mucosal thickening in several ethmoid air cells. There is nasal septal deviation. Electronically Signed   By: Lowella Grip III M.D.   On: 05/13/2019 08:24   Mr Brain Wo Contrast  Result Date: 05/13/2019 CLINICAL DATA:  Bilateral lower extremity proximal muscle weakness. Stroke in 02/2019 with left-sided deficits. EXAM: MRI HEAD WITHOUT CONTRAST TECHNIQUE: Multiplanar, multiecho pulse sequences of the brain and surrounding structures were obtained without intravenous contrast. COMPARISON:  Head CT 05/13/2019 and MRI 03/30/2019 FINDINGS: The study is mildly motion degraded. Brain: There is a 5 mm acute infarct involving subcortical white matter anteriorly in the right frontal lobe (series 5, image 85). The acute lacunar infarct in the right centrum semiovale on the prior MRI demonstrates expected interval evolution, now with focal encephalomalacia. There are numerous chronic supratentorial and infratentorial microhemorrhages with greatest involvement of the thalami suggestive of chronic hypertension. Many more microhemorrhages are apparent on today's study compared to the prior MRI due to the inclusion of susceptibility weighted imaging. Patchy and confluent T2  hyperintensities in the cerebral white matter are similar to the prior MRI and nonspecific but compatible with severe chronic small vessel ischemic disease. Milder chronic small vessel changes are present in the pons. A chronic infarct is again seen involving the right corona radiata and basal ganglia, and there also chronic lacunar infarcts in the thalami. No mass, midline shift, or extra-axial fluid collection is identified. There is moderate cerebral atrophy. Vascular: Major intracranial vascular flow voids are preserved. Skull and upper cervical spine: Unremarkable bone marrow signal. Sinuses/Orbits: Unremarkable orbits. Paranasal sinuses and mastoid air cells are clear. Other: None. IMPRESSION: 1. Acute subcentimeter white matter infarct in the right frontal lobe. 2. Severe chronic small vessel ischemia with chronic lacunar infarcts as above. 3. Numerous chronic microhemorrhages suggesting chronic hypertension. Electronically Signed   By: Logan Bores M.D.   On: 05/13/2019 22:22    Assessment: 68 year old female presenting with worsened BLE weakness on a pre-existing left sided deficit from stroke in May 2020. 1. Weakness is proximal > distal in BLE. There is no muscle tenderness to palpation or muscle wasting, and CK level is normal, making a myositis unlikely. Her weakness is present all day without diurnal variation, bulbar symptoms or SOB, making myasthenia gravis unlikely. 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 See below (#4) for additional DDx.  2. CT head shows not acute infarct. Stable atrophy and chronic small vessel ischemic changes are noted.  3. MRI brain reveals an acute  subcentimeter white matter infarct in the right frontal lobe. Severe chronic small vessel ischemia, chronic lacunar infarcts and numerous chronic microhemorrhages suggesting chronic hypertension are also noted. The tiny acute infarction is not sufficient to explain the patient's new symptoms of BLE weakness.  4. DDx for the new component of the patient's weakness includes, compressive myelopathy,  neurogenic claudication (has chronic low back pain), myopathy, neuropathy and CIDP or AIDP. Most likely underlying deconditioning is contributing to her presentation. Immunocompromised from RA medication, which somewhat increases the risk of an epidural abscess; however, she is currently afebrile. Failure to thrive also a likely contributing factor. She has had significantly decreased PO intake over approximately the past 3 weeks.  5. Other medical conditions: CMV colitis, RA and Sjogren's syndrome   Recommendations: 1. Agree with obtaining creatinine kinase, aldolase, LDH, anti jo, anti ro, anti rnp, anti smith antibody. 2. IVF for elevated AKI. With improved hydration there may be an improvement in her strength. Also will help with hypotension, which may be contributing to her weakness.  3. PT/OT 4. Agree with Plavix for secondary stroke prevention 5. MRI of thoracic and lumbar spine without contrast (has low GFR therefore contrast is contraindicated). If no cord lesion, compressive myelopathy or compression of cauda equina is seen, then may need a spinal tap to assess for possible CIDP or AIDP, which would be higher on the DDx if no structural lesion can be found to explain her weakness. Spine MRIs have been ordered.  6. Will need outpatient EMG/NCS within 1-2 weeks of discharge if above work up does not yield and explanation.  7. CK level.  8. Anti-VGCC antibody level to assess for possible LEMS (ordered)   Electronically signed: Dr. Caryl PinaEric Aulden Calise 05/14/2019, 3:13 AM

## 2019-05-14 NOTE — Progress Notes (Signed)
  Date: 05/14/2019  Patient name: Michelle Graves  Medical record number: [id number]  Date of birth: 09-05-1951   This patient's plan of care was discussed with the house staff. Please see Dr. Noni Saupe note for complete details. I concur with her findings.   Sid Falcon, MD 05/14/2019, 8:21 PM

## 2019-05-14 NOTE — Progress Notes (Signed)
Initial Nutrition Assessment  RD working remotely.  DOCUMENTATION CODES:   Obesity unspecified  INTERVENTION:    Recommend liberalize diet to regular, renal restrictions are not currently required since potassium and phosphorus are WNL   MVI with minerals daily   Ensure Enlive po once daily, each supplement provides 350 kcal and 20 grams of protein  NUTRITION DIAGNOSIS:   Inadequate oral intake related to (lethargy, weakness, decreased appetite) as evidenced by (reports of decreased PO intake for three weeks PTA).  GOAL:   Patient will meet greater than or equal to 90% of their needs  MONITOR:   PO intake, Supplement acceptance, Labs  REASON FOR ASSESSMENT:   Malnutrition Screening Tool    ASSESSMENT:   68 yo female admitted with LE muscle weakness s/p fall at home. She lives with her daughter. PMH includes recent stroke in May 2020 resulting in L sided deficits, recent CMV colitis and GI bleeding during last admission, HTN, RA, and CKD-3.   Unable to speak with patient. No one answered when room number called.  On malnutrition screening tool, patient reported poor intake r/t poor appetite and weight loss PTA. Per review of MD notes, patient has had significantly decreased PO intake over the past 3 weeks.  Per review of weight encounters, patient has not had any significant weight changes over the past 6 months. Per flow sheet documentation, she has been consuming 75-100% of meals since admission.   Labs reviewed. Creatinine 1.46 (H), Hgb 6.5 (L)  Medications reviewed and include [place].    NUTRITION - FOCUSED PHYSICAL EXAM:  unable to complete, working remotely  Diet Order:   Diet Order            Diet renal with fluid restriction Fluid restriction: 1500 mL Fluid; Room service appropriate? Yes; Fluid consistency: Thin  Diet effective now              EDUCATION NEEDS:   Not appropriate for education at this time  Skin:  Skin Assessment: Reviewed RN  Assessment  Last BM:  7/16  Height:   Ht Readings from Last 1 Encounters:  05/13/19 5' 6.5" (1.689 m)    Weight:   Wt Readings from Last 1 Encounters:  05/13/19 89.9 kg    Ideal Body Weight:  60.2 kg  BMI:  Body mass index is 31.51 kg/m.  Estimated Nutritional Needs:   Kcal:  1650-1850  Protein:  85-95 gm  Fluid:  >/= 1.7 L    Molli Barrows, RD, LDN, [place] Pager [phone] After Hours Pager [phone]

## 2019-05-14 NOTE — Progress Notes (Addendum)
PT Cancellation Note  Patient Details Name: Michelle Graves MRN: [id number] DOB: 15-May-1951   Cancelled Treatment:    Reason Eval/Treat Not Completed: Medical issues which prohibited therapy Pt currently with Hgb of 6.5. Will hold until pt medically appropriate and follow up as schedule allows.   13:02: Pt's IV infiltrated, and pt waiting to receive blood. Will hold until pt medically appropriate and follow up as schedule allows.   Leighton Ruff, PT, DPT  Acute Rehabilitation Services  Pager: [phone] Office: [phone]  Rudean Hitt 05/14/2019, 7:59 AM

## 2019-05-14 NOTE — Progress Notes (Signed)
OT Cancellation Note  Patient Details Name: Michelle Graves MRN: [id number] DOB: Apr 04, 1951   Cancelled Treatment:    Reason Eval/Treat Not Completed: Patient not medically ready Pt currently with Hgb of 6.5. Will hold until pt medically appropriate and follow up as schedule allows.  [place], OTR/L Acute Rehab Services Pager [phone] Office [phone]     Almon Register 05/14/2019, 8:00 AM

## 2019-05-14 NOTE — Plan of Care (Signed)
  Problem: Education: Goal: Knowledge of General Education information will improve Description: Including pain rating scale, medication(s)/side effects and non-pharmacologic comfort measures Outcome: Progressing   Problem: Clinical Measurements: Goal: Diagnostic test results will improve Outcome: Progressing   Problem: Clinical Measurements: Goal: Cardiovascular complication will be avoided Outcome: Progressing   Problem: Coping: Goal: Level of anxiety will decrease Outcome: Progressing   

## 2019-05-14 NOTE — Progress Notes (Signed)
Pt MRI of the head has resulted and patient's hgb is 6.5. Dr. Gilford Rile notified. Orders received.

## 2019-05-14 NOTE — Progress Notes (Addendum)
Started to infuse the blood for transfusion when patient complain of pain and pressure from IV access. Flushed saline lock and confirmed pain and pressure in arm. Blood put on hold and IV team consulted.

## 2019-05-14 NOTE — Progress Notes (Signed)
Subjective:   Patient reports feeling a bit better today in terms of weakness. She is having some increased leg swelling, especially on the left lower extremity. She notes she has also been having a frontal headache for the past 2-3 weeks. No other complaints at this time. Denies dizziness, SOB, abdominal pain, nausea, vomiting.   She confirmed again that her last BM was yesterday morning and it did not contain anything red or black. But notes that she has not been doing well in the past few weeks but was slowly starting to get stronger before current admission.   Objective:  Vital signs in last 24 hours: Vitals:   05/14/19 1200 05/14/19 1315 05/14/19 1330 05/14/19 1552  BP: 112/70 108/72 119/74 120/77  Pulse: 86 90 91 93  Resp:  18 18 18   Temp: 99.3 F (37.4 C) 98.9 F (37.2 C) 98.9 F (37.2 C) 98.7 F (37.1 C)  TempSrc: Oral Oral Oral Oral  SpO2: 97% 98% 98% 98%  Weight:      Height:       Physical Exam  Constitutional: She is oriented to person, place, and time. She appears well-developed and well-nourished. No distress.  HENT:  Head: Normocephalic and atraumatic.  Cardiovascular: Normal rate, regular rhythm and normal heart sounds. Exam reveals no gallop and no friction rub.  No murmur heard. 2+ pitting edema in bilateral lower extremities.   Pulmonary/Chest: Effort normal. No respiratory distress. She has no wheezes. She has rales (RLL only, mild).  Abdominal: Soft. Bowel sounds are normal. She exhibits no distension. There is no abdominal tenderness. There is no rebound and no guarding.  Neurological: She is alert and oriented to person, place, and time.  Right lower extremity: 5/5 strength both distally and proximally.   Left lower extremity: 4/5 strength proximally. 1/5 strength distally.   Skin: Skin is warm and dry. She is not diaphoretic.  Nursing note and vitals reviewed.  Labs:  Hgb: 6.5   B12: 1242 TSH: 0.9  Ferritin: 400 Iron: 16 TIBC:  200 Saturation: 8%  Anti-Ro SSA: >9.0 (positive) Anti-La SSB: <0.2 (negative)  HIV: negative  MRI Brain (7/17) 1. Acute subcentimeter white matter infarct in the right frontal lobe. 2. Severe chronic small vessel ischemia with chronic lacunar infarcts as above. 3. Numerous chronic microhemorrhages suggesting chronic Hypertension.  MRI Lumbar (7/18)  L4-5: Multifactorial spinal stenosis that could cause neural compression in the lateral recesses or neural foramina on either or both sides. Advanced facet arthropathy with 5 mm of anterolisthesis. Bulging of the disc with biforaminal protrusions right more than left.  L3-4: Disc bulge. Facet hypertrophy. Mild lateral recess and foraminal narrowing but without definite neural compression.  L5-S1: Bulging of the disc. Mild facet degeneration. No apparent compressive stenosis.  MRI Thoracic (7/18): No significant thoracic region finding  Assessment/Plan:  Principal Problem:   Weakness of both lower extremities Active Problems:   Acute on chronic renal failure (HCC)  #Lower Extremity Weakness:  Approximately 1 day ago, patient had an acute onset of weakness that is worse in the lower extremities and is concentrated in the proximal muscles.  MRI brain shows a acute subcentimeter white matter infarct in the right frontal lobe. Neurology is on board and we appreciate their recommendations. Per neurology, acute infarct is not signifi multifactorial spinal stenosis cant enough to be the cause of patient's new onset weakness. CK levels are within normal limits lowering myositis on the differential.  Per neuro note, bilateral proximal lower extremity weakness in  combination with diminished DTRs puts Lambert-Eaton myasthenic syndrome on the differential.  Anti-VGCC has been ordered and results pending.  To assess for compression myelopathy, MRI of the lumbar and thoracic spine were ordered.  Thoracic MRI negative.  Lumbar MRI shows  multifactorial spinal stenosis, pointing to possible compression myelopathy.  Additional rheumatological studies have been ordered and are pending  -Waiting for neurology recommendations given new lumbar spinal stenosis finding.  #Normocytic Anemia: Likely multifactorial given patient's recent history of GI bleed and known RA, Sjrogens.  Initial hemoglobin on ED presentation was 7.8, however given recent history of GI bleed repeat CBC was done this morning and showed an acute drop to 6.5.  1 unit of pRBCs were ordered and transfusion finished around 5 PM today.  Repeat CBC pending.  Iron studies show low iron, low TIBC, elevated ferritin (likely acute phase reaction). Low iron and TIBC supports anemia of chronic disease.   -If repeat CBC returns below 7.0, repeat transfusion -Daily CBCs during admission  #Acute stroke (7/17), history of stroke (May 2020): Neurology recommends patient continue Plavix. Despite recent GI bleed requiring transfusion, risk of additional strokes outweighs risk of additional bleed. For now, will continue Plavix.  - Continue Plavix  #CMV Colitis:  Continuing antivirals initiated at [facility]. Given recent history of GI bleed 2/2 to CMV colitis, current drop in hemoglobin is suspected to be from the same source. However, patient denies any hematochezia or melena. FOBT negative on admission.     Dispo: Anticipated discharge pending neurology recommendations.    Dr. Verdene Lennert Internal Medicine PGY-1  Pager: [phone] 05/14/2019, 5:17 PM

## 2019-05-15 ENCOUNTER — Inpatient Hospital Stay (HOSPITAL_COMMUNITY): Payer: Medicare Other

## 2019-05-15 DIAGNOSIS — R609 Edema, unspecified: Secondary | ICD-10-CM

## 2019-05-15 DIAGNOSIS — Z8673 Personal history of transient ischemic attack (TIA), and cerebral infarction without residual deficits: Secondary | ICD-10-CM

## 2019-05-15 DIAGNOSIS — I679 Cerebrovascular disease, unspecified: Secondary | ICD-10-CM

## 2019-05-15 DIAGNOSIS — M25572 Pain in left ankle and joints of left foot: Secondary | ICD-10-CM

## 2019-05-15 DIAGNOSIS — I639 Cerebral infarction, unspecified: Secondary | ICD-10-CM

## 2019-05-15 DIAGNOSIS — R531 Weakness: Secondary | ICD-10-CM

## 2019-05-15 LAB — BASIC METABOLIC PANEL
Anion gap: 8 (ref 5–15)
BUN: 12 mg/dL (ref 8–23)
CO2: 22 mmol/L (ref 22–32)
Calcium: 7.6 mg/dL — ABNORMAL LOW (ref 8.9–10.3)
Chloride: 110 mmol/L (ref 98–111)
Creatinine, Ser: 1.47 mg/dL — ABNORMAL HIGH (ref 0.44–1.00)
GFR calc Af Amer: 43 mL/min — ABNORMAL LOW (ref >60)
GFR calc non Af Amer: 37 mL/min — ABNORMAL LOW (ref >60)
Glucose, Bld: 93 mg/dL (ref 70–99)
Potassium: 3.6 mmol/L (ref 3.5–5.1)
Sodium: 140 mmol/L (ref 135–145)

## 2019-05-15 LAB — CBC WITH DIFFERENTIAL/PLATELET
Abs Immature Granulocytes: 0.02 10*3/uL (ref 0.00–0.07)
Basophils Absolute: 0 10*3/uL (ref 0.0–0.1)
Basophils Relative: 0 %
Eosinophils Absolute: 0.1 10*3/uL (ref 0.0–0.5)
Eosinophils Relative: 3 %
HCT: 23.9 % — ABNORMAL LOW (ref 36.0–46.0)
Hemoglobin: 7.8 g/dL — ABNORMAL LOW (ref 12.0–15.0)
Immature Granulocytes: 1 %
Lymphocytes Relative: 57 %
Lymphs Abs: 2.3 10*3/uL (ref 0.7–4.0)
MCH: 30.2 pg (ref 26.0–34.0)
MCHC: 32.6 g/dL (ref 30.0–36.0)
MCV: 92.6 fL (ref 80.0–100.0)
Monocytes Absolute: 0.2 10*3/uL (ref 0.1–1.0)
Monocytes Relative: 5 %
Neutro Abs: 1.4 10*3/uL — ABNORMAL LOW (ref 1.7–7.7)
Neutrophils Relative %: 34 %
Platelets: 258 10*3/uL (ref 150–400)
RBC: 2.58 MIL/uL — ABNORMAL LOW (ref 3.87–5.11)
RDW: 20.2 % — ABNORMAL HIGH (ref 11.5–15.5)
WBC: 4.1 10*3/uL (ref 4.0–10.5)
nRBC: 0.5 % — ABNORMAL HIGH (ref 0.0–0.2)

## 2019-05-15 LAB — TYPE AND SCREEN
ABO/RH(D): O POS
Antibody Screen: NEGATIVE
Unit division: 0

## 2019-05-15 LAB — BPAM RBC
Blood Product Expiration Date: 202007192359
ISSUE DATE / TIME: 202007181217
Unit Type and Rh: 9500

## 2019-05-15 MED ORDER — BIOTENE DRY MOUTH MT LIQD: 15.0000 mL | OROMUCOSAL | Status: DC | PRN

## 2019-05-15 MED ORDER — RAMELTEON 8 MG PO TABS
8.0000 mg | ORAL_TABLET | Freq: Once | ORAL | Status: AC
  Administered 2019-05-15: 8 mg via ORAL
  Filled 2019-05-15: qty 1

## 2019-05-15 MED ORDER — SODIUM CHLORIDE 0.9 % IV SOLN
510.0000 mg | Freq: Once | INTRAVENOUS | Status: AC
  Administered 2019-05-15: 510 mg via INTRAVENOUS
  Filled 2019-05-15: qty 17

## 2019-05-15 NOTE — Progress Notes (Signed)
HPI: Michelle Graves is a 68 y.o. female presenting s/p fall. It is not clearly new onset of BLE weakness alone. She does state today that she feels like her twisted her left anklle that caused her to fall. 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 She also has pre-existing left sided weakness from a stroke in May of this year (two small acute lacunar infarctions). PT notes from her May admission document generalized weakness with decreased activity tolerance at that time and she was discharged to a rehab facility for one week and subsequently discharged to stay at her daughter's house. Of note, she has had significantly decreased PO intake over approximately the past 3 weeks and says she feels tired/worn out in general.  MRI brain was completed, revealing an acute subcentimeter white matter infarct in the right frontal lobe. Severe chronic small vessel ischemia, chronic lacunar infarcts and numerous chronic microhemorrhages suggesting chronic hypertension were also noted.  Subjective Pt adds to HPI today stating: "I think I twisted my left ankle and that's what made me fall". She has notable left side is weak at baseline from previous stroke. On exam her left ankle is swollen and she has pain with decreased ROM. However, she has much improved exam overall. I reviewed her MRI brain findings with her noting more small vessel strokes.   Past Medical History Past Medical History:  Diagnosis Date  . Arthritis   . Chest pain   . Diverticulitis   . Hypertension   . Stroke [facility])     Past Surgical History Past Surgical History:  Procedure Laterality Date  . BIOPSY  04/01/2019   Procedure: BIOPSY;  Surgeon: Kathi Der, MD;  Location: [facility];  Service: Gastroenterology;;  . COLONOSCOPY WITH PROPOFOL N/A 04/01/2019   Procedure: COLONOSCOPY WITH PROPOFOL;  Surgeon:  Kathi Der, MD;  Location: [facility];  Service: Gastroenterology;  Laterality: N/A;  . ESOPHAGOGASTRODUODENOSCOPY (EGD) WITH PROPOFOL N/A 04/01/2019   Procedure: ESOPHAGOGASTRODUODENOSCOPY (EGD) WITH PROPOFOL;  Surgeon: Kathi Der, MD;  Location: [facility] ENDOSCOPY;  Service: Gastroenterology;  Laterality: N/A;  . TONSILLECTOMY      Allergies No Known Allergies  Home Medications Medications Prior to Admission  Medication Sig Dispense Refill  . amLODipine (NORVASC) 10 MG tablet Take 10 mg by mouth daily.    . calcium carbonate (OSCAL) 1500 (600 Ca) MG TABS tablet Take 600 mg of elemental calcium by mouth daily.    . Carboxymethylcellul-Glycerin (CLEAR EYES FOR DRY EYES OP) Place 2 drops into both eyes daily as needed (dry eyes).    . cloNIDine (CATAPRES) 0.3 MG tablet Take 0.3 mg by mouth daily.    . clopidogrel (PLAVIX) 75 MG tablet Take 1 tablet (75 mg total) by mouth daily. 30 tablet 0  . furosemide (LASIX) 40 MG tablet Take 1 tablet (40 mg total) by mouth daily. 30 tablet 0  . hydroxychloroquine (PLAQUENIL) 200 MG tablet Take 200 mg by mouth 2 (two) times daily.   0  . metoprolol tartrate (LOPRESSOR) 100 MG tablet Take 100 mg by mouth 2 (two) times daily.     . Misc Natural Products (OSTEO BI-FLEX JOINT SHIELD PO) Take 1 tablet by mouth daily.    . Multiple Vitamins-Minerals (CENTRUM SILVER 50+WOMEN PO) Take 1 tablet by mouth daily.    . naproxen (NAPROSYN) 500 MG tablet Take 500 mg by mouth daily as needed for mild  pain.     . ondansetron (ZOFRAN) 4 MG tablet Take 1 tablet (4 mg total) by mouth every 6 (six) hours as needed for nausea. 20 tablet 0  . pantoprazole (PROTONIX) 40 MG tablet Take 1 tablet (40 mg total) by mouth 2 (two) times daily. 60 tablet 0  . terbinafine (LAMISIL) 250 MG tablet Take 250 mg by mouth daily.    . traZODone (DESYREL) 100 MG tablet Take 100 mg by mouth at bedtime.       Hospital Medications . calcium carbonate  500 mg of elemental calcium Oral Q  breakfast  . clopidogrel  75 mg Oral Daily  . colestipol  1 g Oral BID  . DULoxetine  30 mg Oral Daily  . feeding supplement (ENSURE ENLIVE)  237 mL Oral Q24H  . ferrous sulfate  325 mg Oral Q breakfast  . gabapentin  300 mg Oral TID  . heparin  5,000 Units Subcutaneous Q8H  . hydroxychloroquine  200 mg Oral BID  . multivitamin with minerals  1 tablet Oral Daily  . pantoprazole  40 mg Oral BID  . traZODone  100 mg Oral QHS  . valGANciclovir  900 mg Oral Daily     Objective  Intake/Output from previous day: 07/18 0701 - 07/19 0700 In: 1077 [P.O.:360; I.V.:15; Blood:315] Out: 2500 [Urine:2500] Intake/Output this shift: Total I/O In: 357 [P.O.:240; IV Piggyback:117] Out: -  Nutritional status:  Diet Order            Diet renal with fluid restriction Fluid restriction: 1500 mL Fluid; Room service appropriate? Yes; Fluid consistency: Thin  Diet effective now               Physical Exam -  Vitals:   05/14/19 1552 05/14/19 2034 05/15/19 0448 05/15/19 0848  BP: 120/77 (!) 121/92 129/87 131/85  Pulse: 93 95 95 (!) 105  Resp: 18 16 16 16   Temp: 98.7 F (37.1 C) 98.9 F (37.2 C) 98.7 F (37.1 C) 98.6 F (37 C)  TempSrc: Oral Oral Oral Oral  SpO2: 98% 97% 96% 95%  Weight:   91.2 kg   Height:       General - pleasant; no acute stress Heart - Regular rate and rhythm - no murmer Lungs - Clear to auscultation Abdomen - Soft - non tender Extremities - Distal pulses intact - trace edema in RLE, 1-2+ LLE M/S: decreased ROM on left ankle/foot. Swollen in ankle joint Skin - Warm and dry  Neurologic Exam:   Mental Status:  Alert, oriented, thought content appropriate. Speech without evidence of dysarthria or aphasia. Able to follow 3 step commands without difficulty.  Cranial Nerves:  II-bilateral visual fields intact III/IV/VI-Pupils were equal and reacted. Extraocular movements were full.  V/VII-no facial numbness and no facial weakness.  VIII-hearing normal.   X-normal speech and symmetrical palatal movement.  XII-midline tongue extension  Motor: both UEs are strong/equal. Tone and bulk:normal tone throughout; no atrophy noted LLE: she can hold against gravity, but drifts some and distal strength testing is limited d/t pain in ankle. 4-/5 RLE: 4+/5, no drift  Sensory: Normal in both UEs, torso and thighs to light touch and temp. Distally, she has notes some chronic bottom of her feet numbness. No tingling. Proprioception is intact today  Deep Tendon Reflexes: 2UE 1+, Trace achilles bilat. 0 patella bilat Plantars: Downgoing bilaterally  Cerebellar: Normal finger to nose and heel to shin bilaterally. Gait: not tested    LABORATORY RESULTS:  Basic Metabolic Panel:  Recent Labs  Lab 05/13/19 0455 05/14/19 0551 05/15/19 0206  NA 139 139 140  K 4.2 3.7 3.6  CL 106 111 110  CO2 23 20* 22  GLUCOSE 96 76 93  BUN 15 13 12   CREATININE 1.76* 1.46* 1.47*  CALCIUM 8.3* 7.4* 7.6*    Liver Function Tests: Recent Labs  Lab 05/13/19 0455  AST 31  ALT 18  ALKPHOS 79  BILITOT 0.5  PROT 5.2*  ALBUMIN 2.2*   No results for input(s): LIPASE, AMYLASE in the last 168 hours. No results for input(s): AMMONIA in the last 168 hours.  CBC: Recent Labs  Lab 05/13/19 0455 05/14/19 0551 05/14/19 2037 05/15/19 0206  WBC 6.4 4.0 4.4 4.1  NEUTROABS 4.6  --   --  1.4*  HGB 7.8* 6.5* 8.0* 7.8*  HCT 25.5* 20.1* 23.7* 23.9*  MCV 101.2* 96.6 91.5 92.6  PLT 234 220 250 258    Cardiac Enzymes: Recent Labs  Lab 05/13/19 1530  CKTOTAL 40    Lipid Panel: No results for input(s): CHOL, TRIG, HDL, CHOLHDL, VLDL, LDLCALC in the last 168 hours.  CBG: No results for input(s): GLUCAP in the last 168 hours.  Microbiology:   Coagulation Studies: No results for input(s): LABPROT, INR in the last 72 hours.  Miscellaneous Labs:   IMAGING RESULTS Dg Chest 2 View  Result Date: 05/13/2019 CLINICAL DATA:  Generalized weakness today. EXAM: CHEST -  2 VIEW COMPARISON:  Single-view of the chest 03/30/2019. PA and lateral chest 11/21/2018. FINDINGS: The lungs are clear. There is cardiomegaly. No pneumothorax or pleural fluid. IMPRESSION: Cardiomegaly without acute disease. Electronically Signed   By: Drusilla Kannerhomas  Dalessio M.D.   On: 05/13/2019 16:45   Mr Brain Wo Contrast  Result Date: 05/13/2019 CLINICAL DATA:  Bilateral lower extremity proximal muscle weakness. Stroke in 02/2019 with left-sided deficits. EXAM: MRI HEAD WITHOUT CONTRAST TECHNIQUE: Multiplanar, multiecho pulse sequences of the brain and surrounding structures were obtained without intravenous contrast. COMPARISON:  Head CT 05/13/2019 and MRI 03/30/2019 FINDINGS: The study is mildly motion degraded. Brain: There is a 5 mm acute infarct involving subcortical white matter anteriorly in the right frontal lobe (series 5, image 85). The acute lacunar infarct in the right centrum semiovale on the prior MRI demonstrates expected interval evolution, now with focal encephalomalacia. There are numerous chronic supratentorial and infratentorial microhemorrhages with greatest involvement of the thalami suggestive of chronic hypertension. Many more microhemorrhages are apparent on today's study compared to the prior MRI due to the inclusion of susceptibility weighted imaging. Patchy and confluent T2 hyperintensities in the cerebral white matter are similar to the prior MRI and nonspecific but compatible with severe chronic small vessel ischemic disease. Milder chronic small vessel changes are present in the pons. A chronic infarct is again seen involving the right corona radiata and basal ganglia, and there also chronic lacunar infarcts in the thalami. No mass, midline shift, or extra-axial fluid collection is identified. There is moderate cerebral atrophy. Vascular: Major intracranial vascular flow voids are preserved. Skull and upper cervical spine: Unremarkable bone marrow signal. Sinuses/Orbits:  Unremarkable orbits. Paranasal sinuses and mastoid air cells are clear. Other: None. IMPRESSION: 1. Acute subcentimeter white matter infarct in the right frontal lobe. 2. Severe chronic small vessel ischemia with chronic lacunar infarcts as above. 3. Numerous chronic microhemorrhages suggesting chronic hypertension. Electronically Signed   By: Sebastian AcheAllen  Grady M.D.   On: 05/13/2019 22:22   Mr Thoracic Spine Wo Contrast  Result Date: 05/14/2019 CLINICAL DATA:  Myelopathy.  EXAM: MRI THORACIC SPINE WITHOUT CONTRAST TECHNIQUE: Multiplanar, multisequence MR imaging of the thoracic spine was performed. No intravenous contrast was administered. COMPARISON:  CT chest 03/26/2019 FINDINGS: Alignment:  Mild curvature convex to the left. Vertebrae: No fracture or significant lesion. Benign hemangioma within the superior T10 vertebral body. Cord: No cord compression or primary cord lesion. Imaging is somewhat degraded by motion. Paraspinal and other soft tissues: Negative Disc levels: No significant disc pathology. Disc bulge at T6-7 without neural compression. Canal and foramina are widely patent throughout the thoracic region. IMPRESSION: No significant thoracic region finding. No evidence of cord compression or primary cord lesion. No spinal stenosis or neural compression. Electronically Signed   By: Nelson Chimes M.D.   On: 05/14/2019 12:16   Mr Lumbar Spine Wo Contrast  Result Date: 05/14/2019 CLINICAL DATA:  Myelopathy EXAM: MRI LUMBAR SPINE WITHOUT CONTRAST TECHNIQUE: Multiplanar, multisequence MR imaging of the lumbar spine was performed. No intravenous contrast was administered. COMPARISON:  Thoracic study same day.  Lumbar MRI 02/17/2018. FINDINGS: Segmentation:  5 lumbar type vertebral bodies. Alignment:  5 mm anterolisthesis L4-5. Vertebrae:  No fracture or primary bone lesion. Conus medullaris and cauda equina: Conus extends to the L1-2 level. Conus and cauda equina appear normal. Paraspinal and other soft  tissues: Negative Disc levels: No abnormality of significance at L2-3 or above. No canal or foraminal stenosis. L3-4: Mild bulging of the disc. Facet and ligamentous hypertrophy. Mild narrowing of the lateral recesses and foramina but without definite neural compression. L4-5: 5 mm of anterolisthesis due to chronic facet arthropathy. Bulging of the disc with biforaminal protrusions right more than left. Stenosis of the lateral recesses and neural foramina that could cause neural compression on either or both sides. L5-S1: Mild bulging of the disc. Mild facet osteoarthritis. No canal or foraminal stenosis. Compared to the study of last year, findings appear quite similar. IMPRESSION: L4-5: Multifactorial spinal stenosis that could cause neural compression in the lateral recesses or neural foramina on either or both sides. Advanced facet arthropathy with 5 mm of anterolisthesis. Bulging of the disc with biforaminal protrusions right more than left. L3-4: Disc bulge. Facet hypertrophy. Mild lateral recess and foraminal narrowing but without definite neural compression. L5-S1: Bulging of the disc. Mild facet degeneration. No apparent compressive stenosis. Electronically Signed   By: Nelson Chimes M.D.   On: 05/14/2019 12:21    Assessment/Plan: 68 year old female presenting with worsened BLE weakness on a pre-existing left sided deficit from stroke in May 2020. This has improved today except for emerging left ankle swelling and soft tissue injury  # Weakness- In general, but do not see signs of progressing or ascending weakness nor numbness as seen in GBS picture. Likely multifactorial d/t overall deconditioning, prev and ongoing small vessel strokes, ankle injury. She does have some baseline neuropathy that may be contributing as well  -MRI thoracic/Lsp reviewed: no abscess or lesions. No compressive stenosis. Nothing that would be causing the presenting problems of "BLE weakness"  -CK level is normal, making a  myositis unlikely.  # Fall- may be due to underlying gen weakness and acute ankle injury.  # Ankle pain & swelling- It appears to be soft tissue injury, but will ck Xray to insure no fx.   # Acute subcentimeter white matter infarct in the right frontal lobe seen on MRI. Severe chronic small vessel ischemia, chronic lacunar infarcts and numerous chronic microhemorrhages suggesting chronic hypertension are also noted.   Recommendations/Plan: I have placed order for  left ankle Xray d/t pt complaint of pain, decreased ROM and evident swelling. Will ask Primary to team to f/u on this. Con't max vascular disease mgt: ASA + Plavix + statin as tolerated Push rehab efforts as tol She is much improved and without signs of progressive ascending weakness/numbness, therefore would not do LP Stroke wk up was just done ~6wk ago. This is the same etiology, therefore no utility in repeating wk up at this time.  Out pt follow up with Dr Pearlean Brownie at [facility] [place], ARNP-C, ANVP-BC Pager: [phone]

## 2019-05-15 NOTE — Plan of Care (Signed)

## 2019-05-15 NOTE — Progress Notes (Signed)
  Date: 05/15/2019  Patient name: Michelle Graves  Medical record number: [id number]  Date of birth: 12/03/1950   I have seen and evaluated this patient and I have discussed the plan of care with the house staff. Please see Dr. Noni Saupe note for complete details. I concur with her findings and plan.   Reviewed Neurology notes.  As weakness is stable and is not progressing, they are now leaning toward deconditioning and recent stroke as etiology.  Recommend ankle xray (reviewed, no osseous issues, but small effusion noted) and to push therapies.  She may benefit from an ankle brace to help with an ligamentous injury.  Dr. Lynnae January will follow up tomorrow.   Sid Falcon, MD 05/15/2019, 8:28 PM

## 2019-05-15 NOTE — Evaluation (Signed)
Physical Therapy Evaluation Patient Details Name: Michelle Graves MRN: [id number] DOB: 1951-04-20 Today's Date: 05/15/2019   History of Present Illness  Pt is a 68 yo female s/p fall, weak and very small R frontal lobe CVA with BLE weakness. with MRI for thoracic spine is negative and MRI of L4-5 showed stenosis. Pt PMHx: arthritis, chest pain and HTN, CVA in 02/2019.  Clinical Impression  PTA pt was living with daughter s/p prior hospitalization. Pt was able to ambulate with RW, was receiving HHPT, and required assist with iADLs. Pt currently limited in safe mobility by pain, as well as decreased strength and endurance. Pt requires minA for bed mobility, transfers and ambulation of 25 feet with RW. PT recommending resumption of HHPT and supervision for all mobility at discharge. PT will continue to follow acutely.    Follow Up Recommendations Home health PT;Supervision for mobility/OOB    Equipment Recommendations  Hospital bed    Recommendations for Other Services       Precautions / Restrictions Precautions Precautions: None Restrictions Weight Bearing Restrictions: No      Mobility  Bed Mobility Overal bed mobility: Needs Assistance Bed Mobility: Supine to Sit     Supine to sit: Min assist     General bed mobility comments: min A for pad scoot of hips to EoB, pt able to power up to sitting with only min guard  Transfers Overall transfer level: Needs assistance Equipment used: Rolling walker (2 wheeled) Transfers: Sit to/from Stand Sit to Stand: Min assist         General transfer comment: min A for blocking L LE and power up on L side, pt with fear of L LE weakness, educated on ability to hold herself up in correctly placed RW  Ambulation/Gait Ambulation/Gait assistance: Min Chemical engineer (Feet): 25 Feet Assistive device: Rolling walker (2 wheeled) Gait Pattern/deviations: Step-through pattern;Decreased step length - right;Decreased step length -  left;Decreased weight shift to left;Shuffle Gait velocity: slowed Gait velocity interpretation: <1.8 ft/sec, indicate of risk for recurrent falls General Gait Details: minA for steadying with slow, step through gait, pt with apprehension about L LE weakness, pt educated on need for proximity to RW to provide support      Balance Overall balance assessment: Needs assistance Sitting-balance support: Feet supported;No upper extremity supported Sitting balance-[name]: Fair     Standing balance support: Bilateral upper extremity supported Standing balance-[name]: Poor Standing balance comment: requires B UE support                             Pertinent Vitals/Pain Pain Assessment: No/denies pain    Home Living Family/patient expects to be discharged to:: Private residence Living Arrangements: Children Available Help at Discharge: Family;Available 24 hours/day Type of Home: House Home Access: Stairs to enter   [employer] of Steps: 2 Home Layout: Able to live on main level with bedroom/bathroom Home Equipment: Walker - 2 wheels;Walker - 4 wheels      Prior Function Level of Independence: Independent with assistive device(s)         Comments: use of RW occasionally,         Extremity/Trunk Assessment   Upper Extremity Assessment Upper Extremity Assessment: Generalized weakness    Lower Extremity Assessment Lower Extremity Assessment: Generalized weakness;LLE deficits/detail LLE Deficits / Details: ankle swelling and pain with weightbearing, pt reports is slipped out from under her precipitating her fall prior to hospitalization, hip and  knee strength grossly 3+/5       Communication      Cognition Arousal/Alertness: Awake/alert Behavior During Therapy: WFL for tasks assessed/performed Overall Cognitive Status: Within Functional Limits for tasks assessed                                        General Comments  General comments (skin integrity, edema, etc.): B ankle edema which increased with ambulation, L>R elevated at end of session         Assessment/Plan    PT Assessment Patient needs continued PT services  PT Problem List Decreased strength;Decreased range of motion;Decreased activity tolerance;Decreased balance;Decreased mobility;Decreased safety awareness;Decreased knowledge of use of DME;Pain       PT Treatment Interventions DME instruction;Gait training;Stair training;Functional mobility training;Therapeutic activities;Therapeutic exercise;Balance training;Cognitive remediation;Patient/family education    PT Goals (Current goals can be found in the Care Plan section)  Acute Rehab PT Goals Patient Stated Goal: go home today PT Goal Formulation: With patient Time For Goal Achievement: 05/29/19 Potential to Achieve Goals: Fair    Frequency Min 3X/week    AM-PAC PT "6 Clicks" Mobility  Outcome Measure Help needed turning from your back to your side while in a flat bed without using bedrails?: None Help needed moving from lying on your back to sitting on the side of a flat bed without using bedrails?: A Little Help needed moving to and from a bed to a chair (including a wheelchair)?: A Little Help needed standing up from a chair using your arms (e.g., wheelchair or bedside chair)?: A Little Help needed to walk in hospital room?: A Little Help needed climbing 3-5 steps with a railing? : A Lot 6 Click Score: 18    End of Session Equipment Utilized During Treatment: Gait belt Activity Tolerance: Patient tolerated treatment well Patient left: in chair;with call bell/phone within reach;with chair alarm set Nurse Communication: Mobility status PT Visit Diagnosis: Unsteadiness on feet (R26.81);Other abnormalities of gait and mobility (R26.89);Muscle weakness (generalized) (M62.81);History of falling (Z91.81);Difficulty in walking, not elsewhere classified (R26.2);Pain Pain - Right/Left:  Left Pain - part of body: Ankle and joints of foot    Time: 1002-1027 PT Time Calculation (min) (ACUTE ONLY): 25 min   Charges:   PT Evaluation $PT Eval Moderate Complexity: 1 Mod PT Treatments $Gait Training: 8-22 mins        Anthony Tamburo B. Migdalia Dk PT, DPT Acute Rehabilitation Services Pager [phone] Office [phone]   [place] 05/15/2019, 1:19 PM

## 2019-05-15 NOTE — Evaluation (Signed)
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 with MRI for thoracic spine is negative and MRI of L4-5 showed stenosis. Pt PMHx: arthritis, chest pain and HTN, CVA in 02/2019.   Clinical Impression   Pt PTA: living with daughter since previous CVA and mostly independent with UB ADL and requiring some assist for LB ADL.Currently, pt requiring assists for ADL due to muscle weakness, fatigue and poor activity tolerance. Pt weakness in BUES and standing tolerance <1 min. Pt ambulating in room with minA for stability with RW. Pt would benefit from continued OT skilled services for ADL, mobility and safety in [place] setting. OT following acutely.     Follow Up Recommendations  Home health OT;Supervision - Intermittent    Equipment Recommendations  None recommended by OT    Recommendations for Other Services       Precautions / Restrictions Precautions Precautions: None Restrictions Weight Bearing Restrictions: No      Mobility Bed Mobility               General bed mobility comments: seated up in recliner  Transfers Overall transfer level: Needs assistance Equipment used: Rolling walker (2 wheeled) Transfers: Sit to/from Stand Sit to Stand: Min assist         General transfer comment: Pt requiring increased assist for L side weakness for minA for power up    Balance Overall balance assessment: Needs assistance Sitting-balance support: Feet supported;No upper extremity supported Sitting balance-[name]: Fair     Standing balance support: Bilateral upper extremity supported Standing balance-[name]: Poor Standing balance comment: requires B UE support                           ADL either performed or assessed with clinical judgement   ADL Overall  ADL's : Needs assistance/impaired Eating/Feeding: Supervision/ safety;Set up;Sitting   Grooming: Wash/dry hands;Wash/dry face;Oral care;Brushing hair;Sitting Grooming Details (indicate cue type and reason): Pt fatigued and unable to stand for task Upper Body Bathing: Set up;Sitting   Lower Body Bathing: Minimal assistance;Sitting/lateral leans;Sit to/from stand   Upper Body Dressing : Set up;Sitting   Lower Body Dressing: Min guard;Minimal assistance;Sitting/lateral leans;Sit to/from stand Lower Body Dressing Details (indicate cue type and reason): donned socks in sitting RLE; LLE requiring assist due to reduced mobility Toilet Transfer: Min guard   Toileting- Clothing Manipulation and Hygiene: Minimal assistance;Sitting/lateral lean;Sit to/from stand       Functional mobility during ADLs: Minimal assistance;Rolling walker;Cueing for safety;Cueing for sequencing General ADL Comments: Pt requiring assists for ADL due to muscle weakness, fatigue and poor activity tolerance     Vision Baseline Vision/History: No visual deficits Vision Assessment?: No apparent visual deficits     Perception     Praxis      Pertinent Vitals/Pain Pain Assessment: No/denies pain     Hand Dominance     Extremity/Trunk Assessment Upper Extremity Assessment Upper Extremity Assessment: Generalized weakness   Lower Extremity Assessment Lower Extremity Assessment: Generalized weakness LLE Deficits / Details: ankle swelling and pain with weightbearing, pt reports is slipped out from under her precipitating her fall prior to hospitalization, hip and knee strength grossly 3+/5   Cervical / Trunk Assessment Cervical / Trunk Assessment: Normal   Communication Communication Communication: No difficulties  Cognition Arousal/Alertness: Awake/alert Behavior During Therapy: WFL for tasks assessed/performed Overall Cognitive Status: Within Functional Limits for tasks assessed                                      General Comments  BLE edema    Exercises     Shoulder Instructions      Home Living Family/patient expects to be discharged to:: Private residence Living Arrangements: Children Available Help at Discharge: Family;Available 24 hours/day Type of Home: House Home Access: Stairs to enter [employer] of Steps: 2   Home Layout: Able to live on main level with bedroom/bathroom     Bathroom Shower/Tub: Chief Strategy Officer: Standard Bathroom Accessibility: Yes   Home Equipment: Environmental consultant - 2 wheels;Walker - 4 wheels          Prior Functioning/Environment Level of Independence: Independent with assistive device(s)        Comments: use of RW occasionally,         OT Problem List: Decreased strength;Decreased activity tolerance;Impaired balance (sitting and/or standing);Decreased safety awareness;Decreased coordination;Pain      OT Treatment/Interventions: Self-care/ADL training;Therapeutic exercise;Neuromuscular education;Energy conservation;DME and/or AE instruction;Therapeutic activities;Patient/family education;Balance training    OT Goals(Current goals can be found in the care plan section) Acute Rehab OT Goals Patient Stated Goal: go home today OT Goal Formulation: With patient Time For Goal Achievement: 05/29/19 Potential to Achieve Goals: Good ADL Goals Pt Will Transfer to Toilet: with modified independence;ambulating;regular height toilet;grab bars Pt Will Perform Toileting - Clothing Manipulation and hygiene: with supervision;sit to/from stand Additional ADL Goal #1: Pt to be supervision level for OOB ADL with fair balance and standing tolerance of 3 mins.  OT Frequency: Min 2X/week   Barriers to D/C:            Co-evaluation              AM-PAC OT "6 Clicks" Daily Activity     Outcome Measure Help from another person eating meals?: None Help from another person taking care of personal grooming?: A  Little Help from another person toileting, which includes using toliet, bedpan, or urinal?: A Lot Help from another person bathing (including washing, rinsing, drying)?: A Lot Help from another person to put on and taking off regular upper body clothing?: A Little Help from another person to put on and taking off regular lower body clothing?: A Lot 6 Click Score: 16   End of Session Equipment Utilized During Treatment: Gait belt;Rolling walker Nurse Communication: Mobility status  Activity Tolerance: Patient tolerated treatment well Patient left: in chair;with call bell/phone within reach;with chair alarm set  OT Visit Diagnosis: Unsteadiness on feet (R26.81);Muscle weakness (generalized) (M62.81)                Time: 4536-4680 OT Time Calculation (min): 24 min Charges:  OT General Charges $OT Visit: 1 Visit OT Evaluation $OT Eval Moderate Complexity: 1 Mod OT Treatments $Self Care/Home Management : 8-22 mins  Revonda Standard Cecil Cranker) Glendell Docker OTR/L Acute Rehabilitation Services Pager: [phone] Office: [phone]   Lonzo Cloud 05/15/2019, 4:03 PM

## 2019-05-15 NOTE — Progress Notes (Signed)
Subjective:   Michelle Graves reports she is doing well. No overnight events. Denies abdominal pain, nausea, vomiting. No acute complaints at this time. She was able to stand up and walk some with PT, still have difficulty standing up on her own. She is tolerating PO but feels that drinking water makes her mouth even more dry.   She did have a BM in the morning and it was normal. No blood or black.    Objective:  Vital signs in last 24 hours: Vitals:   05/14/19 1330 05/14/19 1552 05/14/19 2034 05/15/19 0448  BP: 119/74 120/77 (!) 121/92 129/87  Pulse: 91 93 95 95  Resp: 18 18 16 16   Temp: 98.9 F (37.2 C) 98.7 F (37.1 C) 98.9 F (37.2 C) 98.7 F (37.1 C)  TempSrc: Oral Oral Oral Oral  SpO2: 98% 98% 97% 96%  Weight:    91.2 kg  Height:       Physical Exam  Constitutional: She is oriented to person, place, and time. She appears well-developed and well-nourished. No distress.  HENT:  Head: Normocephalic and atraumatic.  Cardiovascular: Normal rate, regular rhythm and normal heart sounds. Exam reveals no gallop and no friction rub.  No murmur heard. Pulmonary/Chest: Effort normal and breath sounds normal. No respiratory distress. She has no wheezes. She has no rales.  Abdominal: Soft. Bowel sounds are normal. She exhibits no distension. There is no abdominal tenderness. There is no guarding.  Neurological: She is alert and oriented to person, place, and time.  Skin: Skin is warm and dry. No rash noted. She is not diaphoretic. No erythema.  Nursing note and vitals reviewed.   Assessment/Plan:  Principal Problem:   Weakness of both lower extremities Active Problems:   Acute on chronic renal failure [facility]   #Lower Extremity Weakness:  Neurology on board, we appreciate the recommendations. Lumbar MRI shows multifactorial spinal stenosis, pointing to possible compression myelopathy.Given that stenosis could be chronic, unsure if this could reliably explain her acutely worsened  lower extremity weakness. Anti-VGCC still pending for LEMS workup.   Updated by Dr. [facility], Neurology, who doesn't feel that weakness is due to acute neurological changes. Low suspicion for LEMS as her weakness is left-sided mostly and her DTRs are intact on his exam. Ms. Pilgrim mentioned to him that she may have twisted her ankle and that is exacerbating her LE weakness. Neuro has ordered a Xray and we will follow up. Neuro feels that she is appropriate for outpatient follow up with Dr. M[facility] Col[facility] at [facility]  PT recommends home health PT.  -Follow up with left ankle xray -Follow up rest of antibody testing  #Normocytic Anemia: Multifactorial. Hemoglobin has been stable since transfusion. Given patient's recent GI bleed, iron deficiency is likely playing a part as well. IV Feraheme was given today.   -If repeat CBC returns below 7.0, repeat transfusion -Daily CBCs during admission   #AKI:  Slowly improving.Creatinine decreased to 1.7 from 2.2. Unsure if patient is drinking enough fluids, although she is tolerating PO. If no improvement in creatinine by tomorrow, will do IV fluids at that time.  -Repeat BMP tomorrow. -Encourage increased PO water intake.   #Acute stroke (7/17), history of stroke (May 2020): Neurology recommends patient continue Plavix and that treatment would be optimal if she could tolerate dual antiplatelet therapy. She was previously on Aspirin but taken off due to GI bleed. Neuro recommended to follow up with GI to see if patient can tolerate aspirin.   - Continue  Plavix - Follow up with GI regarding Aspirin.    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 Yesterday's drop in hemoglobin is suspected to be secondary to colitis. Hgb stable since transfusion  - Continue Valganciclovir - Monitor for additional signs of GI bleed    Dispo: Anticipated discharge pending neuro recommendations.  Dr. Jose Persia Internal Medicine PGY-1  Pager: [phone] 05/15/2019, 7:58  AM

## 2019-05-15 NOTE — Progress Notes (Signed)
Discussed on rounds with Lamar Benes, NP. Agree with plan in her note. Please call with questions.  -- Amie Portland, MD [facility] Pager: [phone] If 7pm to 7am, please call on call as listed on AMION.

## 2019-05-15 NOTE — Progress Notes (Signed)
Pt asking for something else for sleep. States Trazadone does not help. Messaged MD on call.   Eleanora Neighbor, RN

## 2019-05-16 ENCOUNTER — Ambulatory Visit: Payer: Medicare Other | Admitting: Adult Health

## 2019-05-16 DIAGNOSIS — D649 Anemia, unspecified: Secondary | ICD-10-CM

## 2019-05-16 DIAGNOSIS — Z7902 Long term (current) use of antithrombotics/antiplatelets: Secondary | ICD-10-CM

## 2019-05-16 DIAGNOSIS — Z9889 Other specified postprocedural states: Secondary | ICD-10-CM

## 2019-05-16 DIAGNOSIS — Z7982 Long term (current) use of aspirin: Secondary | ICD-10-CM

## 2019-05-16 DIAGNOSIS — M25472 Effusion, left ankle: Secondary | ICD-10-CM

## 2019-05-16 LAB — CBC
HCT: 24.1 % — ABNORMAL LOW (ref 36.0–46.0)
Hemoglobin: 7.9 g/dL — ABNORMAL LOW (ref 12.0–15.0)
MCH: 30.4 pg (ref 26.0–34.0)
MCHC: 32.8 g/dL (ref 30.0–36.0)
MCV: 92.7 fL (ref 80.0–100.0)
Platelets: 270 10*3/uL (ref 150–400)
RBC: 2.6 MIL/uL — ABNORMAL LOW (ref 3.87–5.11)
RDW: 19.9 % — ABNORMAL HIGH (ref 11.5–15.5)
WBC: 4.9 10*3/uL (ref 4.0–10.5)
nRBC: 0 % (ref 0.0–0.2)

## 2019-05-16 LAB — BASIC METABOLIC PANEL
Anion gap: 7 (ref 5–15)
BUN: 10 mg/dL (ref 8–23)
CO2: 22 mmol/L (ref 22–32)
Calcium: 7.9 mg/dL — ABNORMAL LOW (ref 8.9–10.3)
Chloride: 112 mmol/L — ABNORMAL HIGH (ref 98–111)
Creatinine, Ser: 1.27 mg/dL — ABNORMAL HIGH (ref 0.44–1.00)
GFR calc Af Amer: 51 mL/min — ABNORMAL LOW (ref >60)
GFR calc non Af Amer: 44 mL/min — ABNORMAL LOW (ref >60)
Glucose, Bld: 70 mg/dL (ref 70–99)
Potassium: 3.6 mmol/L (ref 3.5–5.1)
Sodium: 141 mmol/L (ref 135–145)

## 2019-05-16 LAB — ANTI-SMITH ANTIBODY: ENA SM Ab Ser-aCnc: <0.2 AI (ref 0.0–0.9)

## 2019-05-16 LAB — SJOGRENS SYNDROME-A EXTRACTABLE NUCLEAR ANTIBODY: SSA (Ro) (ENA) Antibody, IgG: >8 AI — ABNORMAL HIGH (ref 0.0–0.9)

## 2019-05-16 LAB — ANTI-JO 1 ANTIBODY, IGG: Anti JO-1: <0.2 AI (ref 0.0–0.9)

## 2019-05-16 MED ORDER — ASPIRIN 81 MG PO TBEC: 81.0000 mg | DELAYED_RELEASE_TABLET | Freq: Every day | ORAL | 0 refills | Status: DC

## 2019-05-16 MED ORDER — GABAPENTIN 600 MG PO TABS: 300.0000 mg | ORAL_TABLET | Freq: Three times a day (TID) | ORAL | 0 refills | Status: DC

## 2019-05-16 MED ORDER — VALGANCICLOVIR HCL 450 MG PO TABS: 900.0000 mg | ORAL_TABLET | Freq: Every day | ORAL | 0 refills | Status: AC

## 2019-05-16 MED ORDER — BIOTENE DRY MOUTH MT LIQD: 15.0000 mL | OROMUCOSAL | 1 refills | Status: DC | PRN

## 2019-05-16 MED ORDER — COLESTIPOL HCL 1 G PO TABS: 1.0000 g | ORAL_TABLET | Freq: Two times a day (BID) | ORAL | 0 refills | Status: DC

## 2019-05-16 MED ORDER — ASPIRIN EC 81 MG PO TBEC
81.0000 mg | DELAYED_RELEASE_TABLET | Freq: Every day | ORAL | Status: DC
  Administered 2019-05-16: 81 mg via ORAL
  Filled 2019-05-16: qty 1

## 2019-05-16 MED ORDER — DULOXETINE HCL 30 MG PO CPEP: 30.0000 mg | ORAL_CAPSULE | Freq: Every day | ORAL | 0 refills | Status: DC

## 2019-05-16 NOTE — Progress Notes (Signed)
DISCHARGE NOTE HOME ALEZANDRA EGLI to be discharged Home per MD order. Discussed prescriptions and follow up appointments with the patient. Prescriptions given to patient; medication list explained in detail. Patient verbalized understanding.  Skin clean, dry and intact without evidence of skin break down, no evidence of skin tears noted. IV catheter discontinued intact. Site without signs and symptoms of complications. Dressing and pressure applied. Pt denies pain at the site currently. No complaints noted.  Patient free of lines, drains, and wounds.   An After Visit Summary (AVS) was printed and given to the patient. Patient escorted via wheelchair, and discharged home via private auto.  Paulla Fore, RN

## 2019-05-16 NOTE — Discharge Summary (Signed)
Name: Michelle Graves MRN: [id number] DOB: 04/28/1951 68 y.o. PCP: [facility]  Date of Admission: 05/13/2019  4:31 AM Date of Discharge: 05/16/2019 Attending Physician: No att. providers found  Discharge Diagnosis: 1. Lower extremity weakness 2. Normocytic Anemia 3. Acute Kidney injury 4. Acute stroke 5. CMV Colitis  6. Left Ankle Effusion  Discharge Medications: Allergies as of 05/16/2019   No Known Allergies     Medication List    STOP taking these medications   naproxen 500 MG tablet Commonly known as: NAPROSYN     TAKE these medications   amLODipine 10 MG tablet Commonly known as: NORVASC Take 10 mg by mouth daily. Notes to patient: 05/16/19   antiseptic oral rinse Liqd 15 mLs by Mouth Rinse route as needed for dry mouth.   aspirin 81 MG EC tablet Take 1 tablet (81 mg total) by mouth daily. Notes to patient: 05/17/19   calcium carbonate 1500 (600 Ca) MG Tabs tablet Commonly known as: OSCAL Take 600 mg of elemental calcium by mouth daily. Notes to patient: 05/17/19   CENTRUM SILVER 50+WOMEN PO Take 1 tablet by mouth daily. Notes to patient: 05/17/19   CLEAR EYES FOR DRY EYES OP Place 2 drops into both eyes daily as needed (dry eyes).   cloNIDine 0.3 MG tablet Commonly known as: CATAPRES Take 0.3 mg by mouth daily. Notes to patient: 05/16/19   clopidogrel 75 MG tablet Commonly known as: PLAVIX Take 1 tablet (75 mg total) by mouth daily. Notes to patient: 05/17/19   colestipol 1 g tablet Commonly known as: COLESTID Take 1 tablet (1 g total) by mouth 2 (two) times daily. Notes to patient: 05/16/19 take evening dose   DULoxetine 30 MG capsule Commonly known as: CYMBALTA Take 1 capsule (30 mg total) by mouth daily. Notes to patient: 05/17/19   furosemide 40 MG tablet Commonly known as: LASIX Take 1 tablet (40 mg total) by mouth daily. Notes to patient: 05/16/19   gabapentin 600 MG tablet Commonly known as:  NEURONTIN Take 0.5 tablets (300 mg total) by mouth 3 (three) times daily. Notes to patient: 05/16/19 take afternoon and evening dose   hydroxychloroquine 200 MG tablet Commonly known as: PLAQUENIL Take 200 mg by mouth 2 (two) times daily. Notes to patient: 05/16/19 take evening dose   metoprolol tartrate 100 MG tablet Commonly known as: LOPRESSOR Take 100 mg by mouth 2 (two) times daily. Notes to patient: Take 05/16/19   ondansetron 4 MG tablet Commonly known as: ZOFRAN Take 1 tablet (4 mg total) by mouth every 6 (six) hours as needed for nausea.   OSTEO BI-FLEX JOINT SHIELD PO Take 1 tablet by mouth daily.   pantoprazole 40 MG tablet Commonly known as: PROTONIX Take 1 tablet (40 mg total) by mouth 2 (two) times daily. Notes to patient: 05/16/19 take evening dose   terbinafine 250 MG tablet Commonly known as: LAMISIL Take 250 mg by mouth daily. Notes to patient: 05/16/19   traZODone 100 MG tablet Commonly known as: DESYREL Take 100 mg by mouth at bedtime. Notes to patient: 05/16/19   valGANciclovir 450 MG tablet Commonly known as: VALCYTE Take 2 tablets (900 mg total) by mouth daily for 8 days. Notes to patient: Take 05/17/19            Durable Medical Equipment  (From admission, onward)         Start     Ordered   05/16/19 0000  For home use only  DME Hospital bed    Question Answer Comment  Length of Need 12 Months   The above medical condition requires: Patient requires the ability to reposition frequently   Head must be elevated greater than: 30 degrees   Bed type Semi-electric      05/16/19 1018          Disposition and follow-up:   Ms.Michelle Graves [facility] Good condition.  At the hospital follow up visit please address:  1.    2.  Labs / imaging needed at time of follow-up: CBC, BMP  3.  Pending labs/ test needing follow-up: Anti-VGCC antibody testing.   Follow-up Appointments: Follow-up  Information    [facility] Follow up.   Specialty: Neurology Why: Follow up with your neurologist Dr Pearlean BrownieSethi in 1-2 weeks.  Contact information: [address] [place] [id number] [phone]       [facility] Follow up.   Specialty: Family Medicine Why: Follow up with your PCP in 1-2 weeks for hospital follow up and additional labs.  Contact information: [address] [place] [id number] [phone]           Hospital Course by problem list:  1. Lower extremity weakness:  [name] presented to the emergency room with complaints of ground-level fall that occurred in her daughter's home. Due to history of stroke, CT head and MRI brain was ordered per stroke protocol.  Neurology was on board at this point.  CT head negative.  MRI brain showed acute subcentimeter white matter infarct in the right frontal lobe, in addition to severe chronic small vessel disease and microhemorrhages.  Neurology did not feel that this very small stroke was sufficient to be causing her new onset bilateral weakness.  On initial presentation it seemed that weakness was concentrated in the proximal muscles.  Due to patient's history of autoimmune disorders, a full work-up was ordered including creatinine kinase, aldolase, LDH, anti-Jo, anti-Ro, anti-La, anti-Smith, anti-VGCC, lumbar MRI, and thoracic MRI.  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  Results of these tests are listed below.  MRI lumbar was positive for spinal stenosis however neurology did not suspect it was severe enough to contribute to her symptoms.  Patient's weakness improved and he became more clear that it was concentrated on the left.  Patient then disclosed that she actually twisted her ankle, and she feels like this may be contributing to her weakness.  Ankle x-ray was  ordered at this point and showed a small effusion.  Overall neurology felt that deconditioning was the most likely overlying cause of her symptoms.  PT/OT recommended home health.  2. Normocytic Anemia: Most likely multifactorial due to anemia of chronic disease and iron deficiency anemia secondary to recent GI bleed.  Initial hemoglobin was within normal limits, however second hemoglobin was 6.5.  She was typed and screened at this point and transfused 1 unit of pRBCs.  Hemoglobin was trended and remained above 7 after this point.  She was also given IV Feraheme.  Recommend CBC with her primary care as well as iron supplementation if in recent GI bleed.  3. Acute Kidney injury: Secondary to poor PO intake. Creatinine improved from 2.2 to 1.7.  Recommend outpatient BMP to ensure creatinine returned to baseline.  4. Acute stroke: MRI brain on day of admission showed a acute subcentimeter white matter  infarct in the right frontal lobe.  Neurology was already on board when this was discovered.  No interventions required while admitted.  Neurology recommended dual antiplatelet therapy in addition to a statin.  They are aware that she has history of GI bleed and and only recommend aspirin if patient can tolerate it.  Patient reported she is already taking Aspirin, despite [facility] stopping it.   5. CMV Colitis: Diagnosed initially treated at [facility].  Continued antivirals while admitted here.  6. Left Ankle Effusion:  Patient discloses that on her initial fall she may have twisted her ankle.  Neurology ordered a left ankle x-ray that showed a small effusion but no fractures.  She was discharged with a brace.     Discharge Vitals:   BP (!) 150/92 (BP Location: Left Arm)   Pulse 90   Temp 98.8 F (37.1 C) (Oral)   Resp 18   Ht 5' 6.5" (1.689 m)   Wt 90.8 kg   SpO2 95%   BMI 31.83 kg/m   Pertinent Labs, Studies, and Procedures:   CK: 40 CMV DNA quant PCR: Positive <200  Aldolase: 7.1 LDH: 304 SSA  (Ro) antibody, IgG: >8.0  SSB (La) antibody, IgG: <0.2  Vitamin B12: 1242 TSH: 0.902 Anti-JO-1: <0.2 Anti-Smith: <0.2   BMP Latest Ref Rng & Units 05/16/2019 05/15/2019 05/14/2019  Glucose 70 - 99 mg/dL 70 93 76  BUN 8 - 23 mg/dL 10 12 13   Creatinine 0.44 - 1.00 mg/dL 1.27(H) 1.47(H) 1.46(H)  Sodium 135 - 145 mmol/L 141 140 139  Potassium 3.5 - 5.1 mmol/L 3.6 3.6 3.7  Chloride 98 - 111 mmol/L 112(H) 110 111  CO2 22 - 32 mmol/L 22 22 20(L)  Calcium 8.9 - 10.3 mg/dL 7.9(L) 7.6(L) 7.4(L)    CBC Latest Ref Rng & Units 05/16/2019 05/15/2019 05/14/2019  WBC 4.0 - 10.5 K/uL 4.9 4.1 4.4  Hemoglobin 12.0 - 15.0 g/dL 7.9(L) 7.8(L) 8.0(L)  Hematocrit 36.0 - 46.0 % 24.1(L) 23.9(L) 23.7(L)  Platelets 150 - 400 K/uL 270 258 250   1st Hemoglobin on 05/14/2019: 6.5 --> Tranfused pRBCs   Iron: 16 TIBC: 200 Saturation ratio: 8% Ferritin: 400  CT Head (05/13/2019) IMPRESSION: Stable atrophy with periventricular small vessel disease. No acute infarct evident. No mass or hemorrhage.  There are foci of arterial vascular calcification. There is mucosal thickening in several ethmoid air cells. There is nasal septal Deviation.  MRI Brain (05/13/2019) IMPRESSION: 1. Acute subcentimeter white matter infarct in the right frontal lobe. 2. Severe chronic small vessel ischemia with chronic lacunar infarcts as above. 3. Numerous chronic microhemorrhages suggesting chronic Hypertension.  MRI Thoracic (05/14/2019) IMPRESSION: No significant thoracic region finding. No evidence of cord compression or primary cord lesion. No spinal stenosis or neural compression.  MRI Lumbar (05/14/2019) IMPRESSION: L4-5: Multifactorial spinal stenosis that could cause neural compression in the lateral recesses or neural foramina on either or both sides. Advanced facet arthropathy with 5 mm of anterolisthesis. Bulging of the disc with biforaminal protrusions right more than left.  L3-4: Disc bulge. Facet  hypertrophy. Mild lateral recess and foraminal narrowing but without definite neural compression.  L5-S1: Bulging of the disc. Mild facet degeneration. No apparent compressive stenosis.  Chest X-ray (05/13/2019) IMPRESSION: Cardiomegaly without acute disease.  Left Ankle Xray (05/15/2019) FINDINGS: Diffuse soft tissue swelling. No evidence of acute fracture or dislocation. Tibiotalar joint anatomically aligned with well-preserved joint space. Accessory ossicle adjacent to the tip of the MEDIAL malleolus versus a remote injury with dystrophic calcification.  Small ankle joint effusion suspected.  IMPRESSION: No acute osseous abnormality.    Discharge Instructions: Discharge Instructions    Ankle brace   Complete by: As directed    Call MD for:   Complete by: As directed    Please call your PCP for any blood or black in your stool, SOB, dizziness.   Call MD for:  difficulty breathing, headache or visual disturbances   Complete by: As directed    Call MD for:  extreme fatigue   Complete by: As directed    Call MD for:  persistant dizziness or light-headedness   Complete by: As directed    Diet - low sodium heart healthy   Complete by: As directed    Diet - low sodium heart healthy   Complete by: As directed    Discharge instructions   Complete by: As directed    Thank you for allowing [place] to take care of your during your hospital admission! Here is a short summary of your stay:  1. Lower leg weakness, worse on the left:  You had an extensive work up by the neurologist here, including CT and MRIs. The MRI of your brain showed another small stroke, but it was so small, its unlikely to have cause your worsening weakness. You also had many antibody testing for possible autoimmune disorders, but most of those results are still pending. When you follow up with your PCP, hopefully they will be back and you can go through them together.  Ultimately we felt that your increased  weakness was due to residual side effects of your stroke and muscle deconditioning from the many weeks you spent hospitalized. It will be important to increase your strength, but to do safely. For this, physical therapy and occupation therapy will come to your home to help you.   2. Anemia:  During your hospitalization, you needed a blood transfusion. We found that your hemoglobin was low, and likely due to several reasons, such as your recent colon bleed and history of chronic disease. We watched your hemoglobin carefully during your admission, and it did not get low again after the transfusion. You also received an infusion of IV iron. It is important you follow up with your PCP to make sure you hemoglobin stays stable.   3. CMV Colitis:  Make sure to finish the rest of your antiviral medication to make sure you gut heals successfully.   Face-to-face encounter (required for Medicare/Medicaid patients)   Complete by: As directed    I Verdene Lennert certify that this patient is under my care and that I, or a nurse practitioner or physician's assistant working with me, had a face-to-face encounter that meets the physician face-to-face encounter requirements with this patient on 05/16/2019. The encounter with the patient was in whole, or in part for the following medical condition(s) which is the primary reason for home health care: lower extremity weakness due to deconditioning.   The encounter with the patient was in whole, or in part, for the following medical condition, which is the primary reason for home health care: lower extremity weakness   I certify that, based on my findings, the following services are medically necessary home health services: Physical therapy   Reason for Medically Necessary Home Health Services: Therapy- Therapeutic Exercises to Increase Strength and Endurance   My clinical findings support the need for the above services: OTHER SEE COMMENTS   Further, I certify that my  clinical findings support that this patient is homebound  due to: Unsafe ambulation due to balance issues   For home use only DME Hospital bed   Complete by: As directed    Length of Need: 12 Months   The above medical condition requires: Patient requires the ability to reposition frequently   Head must be elevated greater than: 30 degrees   Bed type: Semi-electric   Home Health   Complete by: As directed    To provide the following care/treatments:  PT OT     Increase activity slowly   Complete by: As directed    Increase activity slowly   Complete by: As directed       Signed:  Dr. Verdene Lennert Internal Medicine PGY-1  Pager: [phone] 05/18/2019, 3:03 PM

## 2019-05-16 NOTE — Progress Notes (Signed)
Length of Need 12 Months   The above medical condition requires: Patient requires the ability to reposition frequently   Head must be elevated greater than: 30 degrees   Bed type Semi-electric    Manya Silvas RN CCM Transitions of Care [phone]

## 2019-05-16 NOTE — TOC Transition Note (Signed)
Transition of Care [facility]) - CM/SW Discharge Note   Patient Details  Name: Michelle Graves MRN: [id number] Date of Birth: 12/03/1950  Transition of Care [facility] CM/SW Contact:  Bartholomew Crews, RN Phone Number: [phone] 05/16/2019, 2:59 PM   Clinical Narrative:    Spoke with patient on the phone to discuss orders for hospital bed and [facility] PT and OT. Patient agreeable, however, upon call back, patient declined hospital bed stating that she doesn't need it. Referral for [facility] PT and OT accepted by [facility] - patient aware. No other transition of needs identified.    Final next level of care: Home w Home Health Services Barriers to Discharge: No Barriers Identified   Patient Goals and CMS Choice   CMS Medicare.gov Compare Post Acute Care list provided to:: Patient Choice offered to / list presented to : Patient  Discharge Placement                       Discharge Plan and Services                DME Arranged: Hospital bed DME Agency: [employer] Date DME Agency Contacted: 05/16/19 Time DME Agency Contacted: [phone] Representative spoke with at DME Agency: Thedore Mins HH Arranged: OT, PT [place] Agency: [place] Date [place]: 05/16/19 Time [place]: [place] Representative spoke with at [place]: [place] ([place]) Interventions     Readmission Risk Interventions No flowsheet data found.

## 2019-05-16 NOTE — Progress Notes (Signed)
  Date: 05/16/2019  Patient name: Michelle Graves  Medical record number: [id number]  Date of birth: December 29, 1950   I have seen and evaluated this patient and I have discussed the plan of care with the house staff. Please see their note for complete details. I concur with their findings with the following additions/corrections: Michelle Graves was seen this morning on team rounds.  She has been easy to work with PT and OT who recommended home health services.  Neurology has seen Michelle Graves over the weekend and feels this is deconditioning rather than a neurologic condition.  She is stable to be discharged to home.  Bartholomew Crews, MD 05/16/2019, 1:56 PM

## 2019-05-16 NOTE — Progress Notes (Signed)
   Subjective:   [name] reports she is feeling good today. No acute complaints at this time. She feels her left ankle is less swollen today.   She also noted that she is already taking baby aspirin daily, but does not take a statin.   Objective:  Vital signs in last 24 hours: Vitals:   05/15/19 1632 05/15/19 2003 05/16/19 0400 05/16/19 0500  BP: (!) 148/87  (!) 150/91   Pulse: (!) 107  89   Resp: 16  16   Temp: 98.8 F (37.1 C)  99.1 F (37.3 C)   TempSrc: Oral  Oral   SpO2: 96%  100%   Weight:  90.8 kg  90.8 kg  Height:       Physical Exam  Constitutional: She is oriented to person, place, and time. She appears well-developed and well-nourished. No distress.  HENT:  Head: Normocephalic and atraumatic.  Cardiovascular: Normal rate, regular rhythm and normal heart sounds. Exam reveals no gallop.  No murmur heard. Pulmonary/Chest: Effort normal and breath sounds normal. No respiratory distress. She has no wheezes. She has no rales.  Abdominal: Soft. She exhibits no distension. There is no abdominal tenderness. There is no rebound.  Musculoskeletal:     Left ankle: She exhibits swelling. She exhibits normal range of motion. No tenderness.  Neurological: She is alert and oriented to person, place, and time.  Skin: She is not diaphoretic.   Labs:   Creatinine: 1.27 (1.47) BUN: 10   Hemoglobin: 7.9 (7.8) MCV: 92.7  Assessment/Plan:  Principal Problem:   Weakness of both lower extremities Active Problems:   Acute on chronic renal failure (HCC)   Swelling   Small vessel disease, cerebrovascular   Acute ischemic stroke (HCC)   General weakness   Acute left ankle pain   #Lower Extremity Weakness: Neurology on board, we appreciate the recommendations. Determined weakness is unlikely to be due to autoimmune or spinal causes. Most likely secondary to deconditioning and residual weakness from CVA in May of this year. Patient spent a significant portion of late  May-June in a hospital bed and only recovered at a SNF for a week. It makes understandable that she is having increased weakness.  Left ankle x-ray did not show any fractures but was positive for small effusion.  -Follow up with left ankle xray   #Normocytic Anemia: Multifactorial. Hemoglobin has been stable since transfusion. Given patient's recent GI bleed, iron deficiency is likely playing a part as well. Discharge summary will include recommendation to have outpatient CBC to ensure patient's hemoglobin continues to be stable.   #AKI:  Slowly improving.Creatinine decreased to 1.7 from 2.2. Unsure if patient is drinking enough fluids, although she is tolerating PO. If no improvement in creatinine by tomorrow, will do IV fluids at that time.  -Repeat BMP tomorrow. -Encourage increased PO water intake.   #Acute stroke(7/17),history of stroke(May 2020): Neurology recommends patient continuePlavix and that treatment would be optimal if she could tolerate dual antiplatelet therapy. She was previously on Aspirin but taken off due to GI bleed. Neuro recommended to follow up with GI to see if patient can tolerate aspirin.   #CMV Colitis:  FOBT negative on admission.Yesterday's drop in hemoglobin is suspected to be secondary to colitis. Hgb stable since transfusion  - Continue Valganciclovir for an additional 8 days to complete 14 day course.  - Monitor for additional signs of GI bleed  Dispo: Anticipated discharge today.   Jose Persia, MD 05/16/2019, 7:50 AM Pager: @[phone] @

## 2019-05-17 LAB — CMV DNA, QUANTITATIVE, PCR
CMV DNA Quant: POSITIVE IU/mL
Log10 CMV Qn DNA Pl: UNDETERMINED log10 IU/mL

## 2019-05-19 LAB — MISC LABCORP TEST (SEND OUT)
LabCorp test name: 140640
Labcorp test code: 140640

## 2019-05-23 ENCOUNTER — Telehealth: Payer: Medicare Other | Admitting: Adult Health

## 2019-05-23 NOTE — Progress Notes (Deleted)
[facility] [address] [place] [id number] [phone]       HOSPITAL FOLLOW UP NOTE  Ms. Michelle MallingBarbara J Graves Date of Birth:  03/28/1951 Medical Record Number:  [id number]   Reason for Referral:  hospital stroke follow up  Virtual Visit via Video Note  I connected with Michelle Graves on 05/23/19 at 11:15 AM EDT by a video enabled telemedicine application located remotely in my own home and verified that I am speaking with the correct person using two identifiers who was located at their own home.   I discussed the limitations of evaluation and management by telemedicine and the availability of in person appointments. The patient expressed understanding and agreed to proceed.    CHIEF COMPLAINT:  No chief complaint on file.   HPI: Michelle Graves is a 68 year old female who was initially scheduled for office face-to-face visit for hospital follow-up regarding 2 small right periventricular white matter infarcts on 03/30/2019.  but due to COVID-19 safety precautions, visit transition to telemedicine via MyChart with patient's consent. History obtained from *** and chart review. Reviewed all radiology images and labs personally.  Michelle Graves is a 68 y.o. female with history of HTN, diverticulitis and arthritis who [facility]30/2019 with nausea, black emesis and abd pain x 3 days. Found to be hypotensive with AKI, and colitis. Developed delirium in hospital for which a MRI was done which showed 2 small right periventricular white matter infarcts most likely due to small vessel disease but unable to rule out embolic source.  CT head no acute finding.  MRI showed small infarcts L left peri-occipital horn and peri-frontal horn of lateral ventricle, severe small vessel disease and several chronic microhemorrhages.  MRA head showed left PCA irregularity otherwise unremarkable.  MRA neck unremarkable.  2D echo unremarkable.  LDL 27 A1c 6.2.   Previously on aspirin and recommended initiation of Plavix which was cleared by GI standpoint.  EKG showed SVT and proximal atrial tachycardia and sinus tachycardia with PACs but did not show evidence of A. fib.  Recommended following up with cardiology outpatient for event monitoring versus loop recorder to rule out atrial fibrillation.  GIB/acute colitis stabilized and follow-up with GI outpatient.  Found to be hypotensive during admission with underlying history of hypertension.  No indication for statin due to low LDL.  Other stroke risk factors include advanced age, EtOH use and obesity but no prior history of stroke.  Discharged to SNF with generalized weakness left greater than right with decreased activity tolerance for ongoing therapy.  She did return to ED status post fall 05/13/2019.  MRI brain obtained which showed acute subcentimeter white matter infarct in the right frontal lobe.  Neurology consulted and did not feel as though acute stroke contributing to bilateral lower extremity weakness and likely multifactorial due to overall deconditioning, previous and ongoing small vessel strokes, neuropathy, decreased nutritional intake and possible left ankle injury.  Ankle x-ray showed possible effusion but no fractures and discharged with a brace.  MRI thoracic did not show did not show any abnormalities that could be contributing to her complaints of BLE weakness.  She was found to have normocytic anemia with Hgb 6.5 and transfused with 1 unit of PRBCs along with AKI likely secondary to poor p.o. intake and recommended follow-up with PCP outpatient for repeat blood work.  She did have extensive autoimmune lab work obtained with resulted labs negative and recommended following up with PCP to  review pending results.  Recommended to initiate DAPT and statin due to recurrent stroke. She was discharged with recommendation of home health PT/OT as her fall was likely secondary to residual weakness from prior  stroke and muscle deconditioning.   ROS:   14 system review of systems performed and negative with exception of ***  PMH:  Past Medical History:  Diagnosis Date   Arthritis    Chest pain    Diverticulitis    Hypertension    Stroke [facility](HCC)     PSH:  Past Surgical History:  Procedure Laterality Date   BIOPSY  04/01/2019   Procedure: BIOPSY;  Surgeon: Kathi DerBrahmbhatt, Parag, MD;  Location: [facility] ENDOSCOPY;  Service: Gastroenterology;;   COLONOSCOPY WITH PROPOFOL N/A 04/01/2019   Procedure: COLONOSCOPY WITH PROPOFOL;  Surgeon: Kathi DerBrahmbhatt, Parag, MD;  Location: [facility] ENDOSCOPY;  Service: Gastroenterology;  Laterality: N/A;   ESOPHAGOGASTRODUODENOSCOPY (EGD) WITH PROPOFOL N/A 04/01/2019   Procedure: ESOPHAGOGASTRODUODENOSCOPY (EGD) WITH PROPOFOL;  Surgeon: Kathi DerBrahmbhatt, Parag, MD;  Location: [facility] ENDOSCOPY;  Service: Gastroenterology;  Laterality: N/A;   TONSILLECTOMY      Social History:  Social History   Socioeconomic History   Marital status: Divorced    Spouse name: Not on file   Number of children: Not on file   Years of education: Not on file   Highest education level: Not on file  Occupational History   Not on file  Social Needs   Financial resource strain: Not on file   Food insecurity    Worry: Not on file    Inability: Not on file   Transportation needs    Medical: Not on file    Non-medical: Not on file  Tobacco Use   Smoking status: Never Smoker   Smokeless tobacco: Never Used  Substance and Sexual Activity   Alcohol use: Yes    Alcohol/week: 21.0 standard drinks    Types: 21 Glasses of wine per week    Comment: sometimes    Drug use: No   Sexual activity: Yes    Partners: Male    Birth control/protection: Condom  Lifestyle   Physical activity    Days per week: Not on file    Minutes per session: Not on file   Stress: Not on file  Relationships   Social connections    Talks on phone: Not on file    Gets together: Not on file    Attends  religious service: Not on file    Active member of club or organization: Not on file    Attends meetings of clubs or organizations: Not on file    Relationship status: Not on file   Intimate partner violence    Fear of current or ex partner: Not on file    Emotionally abused: Not on file    Physically abused: Not on file    Forced sexual activity: Not on file  Other Topics Concern   Not on file  Social History Narrative   Not on file    Family History:  Family History  Problem Relation Age of Onset   Hypertension Mother    Diabetes Mother    CAD Father        died of MI at age 177   Hypertension Father    Diabetes Sister    Diabetes Sister    Kidney disease Neg Hx     Medications:   Current Outpatient Medications on File Prior to Visit  Medication Sig Dispense Refill   amLODipine (NORVASC) 10 MG tablet  Take 10 mg by mouth daily.     antiseptic oral rinse (BIOTENE) LIQD 15 mLs by Mouth Rinse route as needed for dry mouth. 273 mL 1   aspirin EC 81 MG EC tablet Take 1 tablet (81 mg total) by mouth daily. 30 tablet 0   calcium carbonate (OSCAL) 1500 (600 Ca) MG TABS tablet Take 600 mg of elemental calcium by mouth daily.     Carboxymethylcellul-Glycerin (CLEAR EYES FOR DRY EYES OP) Place 2 drops into both eyes daily as needed (dry eyes).     cloNIDine (CATAPRES) 0.3 MG tablet Take 0.3 mg by mouth daily.     clopidogrel (PLAVIX) 75 MG tablet Take 1 tablet (75 mg total) by mouth daily. 30 tablet 0   colestipol (COLESTID) 1 g tablet Take 1 tablet (1 g total) by mouth 2 (two) times daily. 30 tablet 0   DULoxetine (CYMBALTA) 30 MG capsule Take 1 capsule (30 mg total) by mouth daily. 30 capsule 0   furosemide (LASIX) 40 MG tablet Take 1 tablet (40 mg total) by mouth daily. 30 tablet 0   gabapentin (NEURONTIN) 600 MG tablet Take 0.5 tablets (300 mg total) by mouth 3 (three) times daily. 30 tablet 0   hydroxychloroquine (PLAQUENIL) 200 MG tablet Take 200 mg by mouth  2 (two) times daily.   0   metoprolol tartrate (LOPRESSOR) 100 MG tablet Take 100 mg by mouth 2 (two) times daily.      Misc Natural Products (OSTEO BI-FLEX JOINT SHIELD PO) Take 1 tablet by mouth daily.     Multiple Vitamins-Minerals (CENTRUM SILVER 50+WOMEN PO) Take 1 tablet by mouth daily.     ondansetron (ZOFRAN) 4 MG tablet Take 1 tablet (4 mg total) by mouth every 6 (six) hours as needed for nausea. 20 tablet 0   pantoprazole (PROTONIX) 40 MG tablet Take 1 tablet (40 mg total) by mouth 2 (two) times daily. 60 tablet 0   terbinafine (LAMISIL) 250 MG tablet Take 250 mg by mouth daily.     traZODone (DESYREL) 100 MG tablet Take 100 mg by mouth at bedtime.      valGANciclovir (VALCYTE) 450 MG tablet Take 2 tablets (900 mg total) by mouth daily for 8 days. 16 tablet 0   No current facility-administered medications on file prior to visit.     Allergies:  No Known Allergies   Physical Exam  There were no vitals filed for this visit. There is no height or weight on file to calculate BMI. No exam data present  No flowsheet data found.   General: well developed, well nourished, seated, in no evident distress Head: head normocephalic and atraumatic.   Neck: supple with no carotid or supraclavicular bruits Cardiovascular: regular rate and rhythm, no murmurs Musculoskeletal: no deformity Skin:  no rash/petichiae Vascular:  Normal pulses all extremities   Neurologic Exam Mental Status: Awake and fully alert. Oriented to place and time. Recent and remote memory intact. Attention span, concentration and fund of knowledge appropriate. Mood and affect appropriate.  Cranial Nerves: Fundoscopic exam reveals sharp disc margins. Pupils equal, briskly reactive to light. Extraocular movements full without nystagmus. Visual fields full to confrontation. Hearing intact. Facial sensation intact. Face, tongue, palate moves normally and symmetrically.  Motor: Normal bulk and tone. Normal strength  in all tested extremity muscles. Sensory.: intact to touch , pinprick , position and vibratory sensation.  Coordination: Rapid alternating movements normal in all extremities. Finger-to-nose and heel-to-shin performed accurately bilaterally. Gait and Station: Arises from chair  without difficulty. Stance is normal. Gait demonstrates normal stride length and balance Reflexes: 1+ and symmetric. Toes downgoing.     NIHSS  *** Modified Rankin  *** CHA2DS2-VASc *** HAS-BLED ***   Diagnostic Data (Labs, Imaging, Testing)  CT HEAD WO CONTRAST 03/29/2019 IMPRESSION: 1. Age advanced cerebral atrophy, ventriculomegaly and significant periventricular white matter disease. 2. No acute intracranial findings or mass lesion.  MR BRAIN WO CONTRAST 03/30/2019 IMPRESSION: 1. Positive for two small acute on chronic cerebral white matter lacunar infarcts, both in the right hemisphere. No associated hemorrhage or mass effect. 2. Underlying severe chronic small vessel disease, including several chronic micro-hemorrhages.  05/13/2019 IMPRESSION: 1. Acute subcentimeter white matter infarct in the right frontal lobe. 2. Severe chronic small vessel ischemia with chronic lacunar infarcts as above. 3. Numerous chronic microhemorrhages suggesting chronic hypertension.  MR MRA HEAD  MR MRA NECK 03/31/2019 IMPRESSION: Allowing for the technical limitations of a noncontrast MR angiogram, no neck vessel abnormality is seen. Both carotid bifurcations appear widely patent.  No intracranial large or medium vessel occlusion or correctable proximal stenosis. Distal vessels show atherosclerotic irregularity, particularly in the left PCA territory with there is diminished distal vessel demonstration.  ECHOCARDIOGRAM 03/27/2019 IMPRESSIONS  1. The left ventricle has normal systolic function, with an ejection fraction of 55-60%. The cavity size was normal. There is mild concentric left ventricular  hypertrophy. Left ventricular diastolic Doppler parameters are consistent with impaired  relaxation. Indeterminate filling pressures.  2. The mitral valve is grossly normal. Mild thickening of the mitral valve leaflet. There is mild mitral annular calcification present. Mitral valve regurgitation is mild to moderate by color flow Doppler. The MR jet is eccentric posteriorly directed.  3. The tricuspid valve is grossly normal.  4. The aortic valve is tricuspid. Aortic valve regurgitation is trivial by color flow Doppler.  5. The aortic root is normal in size and structure.  6. There is mild dilatation of the ascending aorta measuring 39 mm.  7. The interatrial septum was not well visualized.      ASSESSMENT: Michelle Graves is a 68 y.o. year old female here with 2 small right periventricular white matter infarcts on 03/30/2019 secondary to likely small vessel disease but cannot rule out cardioembolic source therefore recommended outpatient cardiac monitoring versus loop recorder to rule out atrial fibrillation.  Initially presented to ED on 03/24/2019 with nausea, black emesis and abdominal pain for 3 days with diagnosis of GIB/acute colitis who developed delirium during admission therefore neurology consulted.  Stroke finding likely incidental.  Vascular risk factors include HTN, obesity and EtOH use.     PLAN:  1. Right periventricular WM infarcts: Continue clopidogrel 75 mg daily for secondary stroke prevention.  LDL 27 therefore statin not indicated.  Maintain strict control of hypertension with blood pressure goal below 130/90, diabetes with hemoglobin A1c goal below 6.5% and cholesterol with LDL cholesterol (bad cholesterol) goal below 70 mg/dL.  I also advised the patient to eat a healthy diet with plenty of whole grains, cereals, fruits and vegetables, exercise regularly with at least 30 minutes of continuous activity daily and maintain ideal body weight. 2. HTN: Advised to continue current  treatment regimen.  Today's BP ***.  Advised to continue to monitor at home along with continued follow-up with PCP for management    Follow up in *** or call earlier if needed   Greater than 50% of time during this 45 minute visit was spent on counseling, explanation of diagnosis of right periventricular  WM infarcts, reviewing risk factor management of HTN, planning of further management along with potential future management, and discussion with patient and family answering all questions.    Venancio Poisson, AGNP-BC  [facility] [address] [place], [place] [address]  Phone [phone] Fax [phone] Note: This document was prepared with digital dictation and possible smart phrase technology. Any transcriptional errors that result from this process are unintentional.

## 2019-05-26 ENCOUNTER — Ambulatory Visit: Payer: Medicare Other | Admitting: Adult Health

## 2019-05-26 ENCOUNTER — Telehealth: Payer: Self-pay | Admitting: *Deleted

## 2019-05-26 NOTE — Telephone Encounter (Signed)
I called pt, spoke to daughter, Ilona Sorrel.  [phone].  Changed appt to 06-27-19 VV (doxy.me).

## 2019-05-28 ENCOUNTER — Other Ambulatory Visit: Payer: Self-pay

## 2019-05-28 ENCOUNTER — Inpatient Hospital Stay (HOSPITAL_COMMUNITY)
Admission: EM | Admit: 2019-05-28 | Discharge: 2019-06-02 | DRG: 683 | Disposition: A | Discharge status: Skilled Nursing Facility | Payer: Medicare Other | Attending: Family Medicine | Admitting: Family Medicine

## 2019-05-28 ENCOUNTER — Emergency Department (HOSPITAL_COMMUNITY): Payer: Medicare Other

## 2019-05-28 DIAGNOSIS — B259 Cytomegaloviral disease, unspecified: Secondary | ICD-10-CM

## 2019-05-28 DIAGNOSIS — I69354 Hemiplegia and hemiparesis following cerebral infarction affecting left non-dominant side: Secondary | ICD-10-CM | POA: Diagnosis not present

## 2019-05-28 DIAGNOSIS — N179 Acute kidney failure, unspecified: Principal | ICD-10-CM | POA: Diagnosis present

## 2019-05-28 DIAGNOSIS — Z7982 Long term (current) use of aspirin: Secondary | ICD-10-CM | POA: Diagnosis not present

## 2019-05-28 DIAGNOSIS — I679 Cerebrovascular disease, unspecified: Secondary | ICD-10-CM | POA: Diagnosis present

## 2019-05-28 DIAGNOSIS — E46 Unspecified protein-calorie malnutrition: Secondary | ICD-10-CM | POA: Diagnosis present

## 2019-05-28 DIAGNOSIS — M069 Rheumatoid arthritis, unspecified: Secondary | ICD-10-CM | POA: Diagnosis present

## 2019-05-28 DIAGNOSIS — R29898 Other symptoms and signs involving the musculoskeletal system: Secondary | ICD-10-CM | POA: Diagnosis present

## 2019-05-28 DIAGNOSIS — M48061 Spinal stenosis, lumbar region without neurogenic claudication: Secondary | ICD-10-CM | POA: Diagnosis present

## 2019-05-28 DIAGNOSIS — E871 Hypo-osmolality and hyponatremia: Secondary | ICD-10-CM | POA: Diagnosis present

## 2019-05-28 DIAGNOSIS — M25572 Pain in left ankle and joints of left foot: Secondary | ICD-10-CM | POA: Diagnosis present

## 2019-05-28 DIAGNOSIS — A0839 Other viral enteritis: Secondary | ICD-10-CM | POA: Diagnosis present

## 2019-05-28 DIAGNOSIS — M1990 Unspecified osteoarthritis, unspecified site: Secondary | ICD-10-CM | POA: Diagnosis present

## 2019-05-28 DIAGNOSIS — F329 Major depressive disorder, single episode, unspecified: Secondary | ICD-10-CM | POA: Diagnosis present

## 2019-05-28 DIAGNOSIS — I129 Hypertensive chronic kidney disease with stage 1 through stage 4 chronic kidney disease, or unspecified chronic kidney disease: Secondary | ICD-10-CM | POA: Diagnosis present

## 2019-05-28 DIAGNOSIS — R64 Cachexia: Secondary | ICD-10-CM | POA: Diagnosis present

## 2019-05-28 DIAGNOSIS — Z6830 Body mass index (BMI) 30.0-30.9, adult: Secondary | ICD-10-CM | POA: Diagnosis not present

## 2019-05-28 DIAGNOSIS — I959 Hypotension, unspecified: Secondary | ICD-10-CM | POA: Diagnosis present

## 2019-05-28 DIAGNOSIS — N183 Chronic kidney disease, stage 3 (moderate): Secondary | ICD-10-CM | POA: Diagnosis present

## 2019-05-28 DIAGNOSIS — Z79899 Other long term (current) drug therapy: Secondary | ICD-10-CM | POA: Diagnosis not present

## 2019-05-28 DIAGNOSIS — D638 Anemia in other chronic diseases classified elsewhere: Secondary | ICD-10-CM | POA: Diagnosis present

## 2019-05-28 DIAGNOSIS — D631 Anemia in chronic kidney disease: Secondary | ICD-10-CM | POA: Diagnosis present

## 2019-05-28 DIAGNOSIS — I739 Peripheral vascular disease, unspecified: Secondary | ICD-10-CM | POA: Diagnosis present

## 2019-05-28 DIAGNOSIS — R112 Nausea with vomiting, unspecified: Secondary | ICD-10-CM | POA: Diagnosis present

## 2019-05-28 DIAGNOSIS — Z7902 Long term (current) use of antithrombotics/antiplatelets: Secondary | ICD-10-CM

## 2019-05-28 DIAGNOSIS — Z8349 Family history of other endocrine, nutritional and metabolic diseases: Secondary | ICD-10-CM

## 2019-05-28 DIAGNOSIS — E441 Mild protein-calorie malnutrition: Secondary | ICD-10-CM | POA: Diagnosis present

## 2019-05-28 DIAGNOSIS — Z20828 Contact with and (suspected) exposure to other viral communicable diseases: Secondary | ICD-10-CM | POA: Diagnosis present

## 2019-05-28 DIAGNOSIS — B258 Other cytomegaloviral diseases: Secondary | ICD-10-CM | POA: Diagnosis present

## 2019-05-28 DIAGNOSIS — R109 Unspecified abdominal pain: Secondary | ICD-10-CM

## 2019-05-28 LAB — RAPID URINE DRUG SCREEN, HOSP PERFORMED
Amphetamines: NOT DETECTED
Barbiturates: NOT DETECTED
Benzodiazepines: NOT DETECTED
Cocaine: NOT DETECTED
Opiates: POSITIVE — AB
Tetrahydrocannabinol: NOT DETECTED

## 2019-05-28 LAB — CBC WITH DIFFERENTIAL/PLATELET
Abs Immature Granulocytes: 0.03 10*3/uL (ref 0.00–0.07)
Basophils Absolute: 0 10*3/uL (ref 0.0–0.1)
Basophils Relative: 1 %
Eosinophils Absolute: 0 10*3/uL (ref 0.0–0.5)
Eosinophils Relative: 0 %
HCT: 26.9 % — ABNORMAL LOW (ref 36.0–46.0)
Hemoglobin: 8.7 g/dL — ABNORMAL LOW (ref 12.0–15.0)
Immature Granulocytes: 1 %
Lymphocytes Relative: 47 %
Lymphs Abs: 2.3 10*3/uL (ref 0.7–4.0)
MCH: 31.5 pg (ref 26.0–34.0)
MCHC: 32.3 g/dL (ref 30.0–36.0)
MCV: 97.5 fL (ref 80.0–100.0)
Monocytes Absolute: 0.2 10*3/uL (ref 0.1–1.0)
Monocytes Relative: 5 %
Neutro Abs: 2.3 10*3/uL (ref 1.7–7.7)
Neutrophils Relative %: 46 %
Platelets: 271 10*3/uL (ref 150–400)
RBC: 2.76 MIL/uL — ABNORMAL LOW (ref 3.87–5.11)
RDW: 18.4 % — ABNORMAL HIGH (ref 11.5–15.5)
WBC: 4.9 10*3/uL (ref 4.0–10.5)
nRBC: 0 % (ref 0.0–0.2)

## 2019-05-28 LAB — URINALYSIS, ROUTINE W REFLEX MICROSCOPIC
Bacteria, UA: NONE SEEN
Bilirubin Urine: NEGATIVE
Glucose, UA: 50 mg/dL — AB
Ketones, ur: NEGATIVE mg/dL
Leukocytes,Ua: NEGATIVE
Nitrite: NEGATIVE
Protein, ur: NEGATIVE mg/dL
Specific Gravity, Urine: 1.008 (ref 1.005–1.030)
pH: 6 (ref 5.0–8.0)

## 2019-05-28 LAB — COMPREHENSIVE METABOLIC PANEL
ALT: 12 U/L (ref 0–44)
AST: 37 U/L (ref 15–41)
Albumin: 2.4 g/dL — ABNORMAL LOW (ref 3.5–5.0)
Alkaline Phosphatase: 89 U/L (ref 38–126)
Anion gap: 9 (ref 5–15)
BUN: 24 mg/dL — ABNORMAL HIGH (ref 8–23)
CO2: 25 mmol/L (ref 22–32)
Calcium: 8.3 mg/dL — ABNORMAL LOW (ref 8.9–10.3)
Chloride: 99 mmol/L (ref 98–111)
Creatinine, Ser: 2.26 mg/dL — ABNORMAL HIGH (ref 0.44–1.00)
GFR calc Af Amer: 25 mL/min — ABNORMAL LOW (ref >60)
GFR calc non Af Amer: 22 mL/min — ABNORMAL LOW (ref >60)
Glucose, Bld: 89 mg/dL (ref 70–99)
Potassium: 5.8 mmol/L — ABNORMAL HIGH (ref 3.5–5.1)
Sodium: 133 mmol/L — ABNORMAL LOW (ref 135–145)
Total Bilirubin: 1.2 mg/dL (ref 0.3–1.2)
Total Protein: 6.2 g/dL — ABNORMAL LOW (ref 6.5–8.1)

## 2019-05-28 LAB — CBG MONITORING, ED: Glucose-Capillary: 89 mg/dL (ref 70–99)

## 2019-05-28 LAB — TSH: TSH: 1.137 u[IU]/mL (ref 0.350–4.500)

## 2019-05-28 LAB — PHOSPHORUS: Phosphorus: 3.5 mg/dL (ref 2.5–4.6)

## 2019-05-28 LAB — TROPONIN I (HIGH SENSITIVITY)
Troponin I (High Sensitivity): 10 ng/L (ref <18)
Troponin I (High Sensitivity): 7 ng/L (ref <18)

## 2019-05-28 LAB — MAGNESIUM: Magnesium: 1.5 mg/dL — ABNORMAL LOW (ref 1.7–2.4)

## 2019-05-28 LAB — LACTIC ACID, PLASMA: Lactic Acid, Venous: 0.9 mmol/L (ref 0.5–1.9)

## 2019-05-28 LAB — AMMONIA: Ammonia: 18 umol/L (ref 9–35)

## 2019-05-28 MED ORDER — METOPROLOL TARTRATE 25 MG PO TABS
100.0000 mg | ORAL_TABLET | Freq: Two times a day (BID) | ORAL | Status: DC
  Filled 2019-05-28: qty 4

## 2019-05-28 MED ORDER — CLOPIDOGREL BISULFATE 75 MG PO TABS
75.0000 mg | ORAL_TABLET | Freq: Every day | ORAL | Status: DC
  Administered 2019-05-29 – 2019-06-02 (×5): 75 mg via ORAL
  Filled 2019-05-28 (×5): qty 1

## 2019-05-28 MED ORDER — FUROSEMIDE 20 MG PO TABS
40.0000 mg | ORAL_TABLET | Freq: Every day | ORAL | Status: DC
  Filled 2019-05-28: qty 2

## 2019-05-28 MED ORDER — PANTOPRAZOLE SODIUM 40 MG PO TBEC: 40.0000 mg | DELAYED_RELEASE_TABLET | Freq: Two times a day (BID) | ORAL | Status: DC | PRN

## 2019-05-28 MED ORDER — AMLODIPINE BESYLATE 5 MG PO TABS
10.0000 mg | ORAL_TABLET | Freq: Every day | ORAL | Status: DC
  Filled 2019-05-28: qty 2

## 2019-05-28 MED ORDER — COLESTIPOL HCL 1 G PO TABS
1.0000 g | ORAL_TABLET | Freq: Two times a day (BID) | ORAL | Status: DC
  Administered 2019-05-28 – 2019-06-02 (×10): 1 g via ORAL
  Filled 2019-05-28 (×11): qty 1

## 2019-05-28 MED ORDER — ASPIRIN EC 81 MG PO TBEC
81.0000 mg | DELAYED_RELEASE_TABLET | Freq: Every day | ORAL | Status: DC
  Administered 2019-05-29 – 2019-06-02 (×5): 81 mg via ORAL
  Filled 2019-05-28 (×6): qty 1

## 2019-05-28 MED ORDER — ONDANSETRON HCL 4 MG PO TABS: 4.0000 mg | ORAL_TABLET | Freq: Four times a day (QID) | ORAL | Status: DC | PRN

## 2019-05-28 MED ORDER — TERBINAFINE HCL 250 MG PO TABS: 250.0000 mg | ORAL_TABLET | Freq: Every day | ORAL | Status: DC

## 2019-05-28 MED ORDER — SODIUM CHLORIDE 0.9 % IV SOLN
1000.0000 mL | INTRAVENOUS | Status: DC
  Administered 2019-05-28 (×2): 1000 mL via INTRAVENOUS

## 2019-05-28 MED ORDER — ACETAMINOPHEN 325 MG PO TABS
650.0000 mg | ORAL_TABLET | Freq: Four times a day (QID) | ORAL | Status: DC | PRN
  Administered 2019-06-02: 650 mg via ORAL
  Filled 2019-05-28: qty 2

## 2019-05-28 MED ORDER — DEXTROSE-NACL 5-0.45 % IV SOLN
INTRAVENOUS | Status: AC
  Administered 2019-05-28 – 2019-05-29 (×3): via INTRAVENOUS

## 2019-05-28 MED ORDER — CLONIDINE HCL 0.2 MG PO TABS
0.3000 mg | ORAL_TABLET | Freq: Every day | ORAL | Status: DC
  Administered 2019-05-29 – 2019-05-30 (×2): 0.3 mg via ORAL
  Filled 2019-05-28 (×2): qty 1

## 2019-05-28 MED ORDER — ACETAMINOPHEN 650 MG RE SUPP: 650.0000 mg | Freq: Four times a day (QID) | RECTAL | Status: DC | PRN

## 2019-05-28 MED ORDER — DULOXETINE HCL 60 MG PO CPEP
60.0000 mg | ORAL_CAPSULE | Freq: Every day | ORAL | Status: DC
  Administered 2019-05-29 – 2019-06-02 (×5): 60 mg via ORAL
  Filled 2019-05-28 (×5): qty 1

## 2019-05-28 MED ORDER — SODIUM CHLORIDE 0.9 % IV BOLUS (SEPSIS)
500.0000 mL | Freq: Once | INTRAVENOUS | Status: AC
  Administered 2019-05-28: 500 mL via INTRAVENOUS

## 2019-05-28 MED ORDER — TRAMADOL HCL 50 MG PO TABS
50.0000 mg | ORAL_TABLET | Freq: Two times a day (BID) | ORAL | Status: DC | PRN
  Filled 2019-05-28: qty 1

## 2019-05-28 MED ORDER — TRAZODONE HCL 50 MG PO TABS
100.0000 mg | ORAL_TABLET | Freq: Every day | ORAL | Status: DC
  Administered 2019-05-28 – 2019-05-29 (×2): 100 mg via ORAL
  Filled 2019-05-28 (×2): qty 2

## 2019-05-28 MED ORDER — PANTOPRAZOLE SODIUM 40 MG PO TBEC
40.0000 mg | DELAYED_RELEASE_TABLET | Freq: Every day | ORAL | Status: DC
  Filled 2019-05-28: qty 1

## 2019-05-28 MED ORDER — DOCUSATE SODIUM 100 MG PO CAPS
100.0000 mg | ORAL_CAPSULE | Freq: Two times a day (BID) | ORAL | Status: DC
  Administered 2019-05-28 – 2019-06-02 (×10): 100 mg via ORAL
  Filled 2019-05-28 (×10): qty 1

## 2019-05-28 MED ORDER — HYDROXYCHLOROQUINE SULFATE 200 MG PO TABS
200.0000 mg | ORAL_TABLET | Freq: Two times a day (BID) | ORAL | Status: DC
  Administered 2019-05-28 – 2019-06-02 (×10): 200 mg via ORAL
  Filled 2019-05-28 (×10): qty 1

## 2019-05-28 NOTE — ED Provider Notes (Signed)
[facility] EMERGENCY DEPARTMENT Provider Note   CSN: [id number] Arrival date & time: 05/28/19  1646     History   Chief Complaint Chief Complaint  Patient presents with  . Weakness    HPI Michelle Graves is a 68 y.o. female.     HPI Patient has ongoing complaint of general weakness and some pain in her left foot.  She reports it is never gotten better or changed since her hospitalization and is actually been going on for a number of months.  No change that she can identify but she just continues to feel extremely fatigued, weak and having pain in her left leg that is preventing her from walking very well.  He does not endorse fevers or myalgias.  No chest pain or shortness of breath.  She has not had recent vomiting or diarrhea. Past Medical History:  Diagnosis Date  . Arthritis   . Chest pain   . Diverticulitis   . Hypertension   . Stroke [facility])     Patient Active Problem List   Diagnosis Date Noted  . Swelling   . Small vessel disease, cerebrovascular   . Acute ischemic stroke ([place])   . General weakness   . Acute left ankle pain   . Acute on chronic renal failure ([place]) 05/13/2019  . Weakness of both lower extremities 05/13/2019  . Cerebral thrombosis with cerebral infarction 03/31/2019  . New onset a-fib ([place]) 03/27/2019  . Acute kidney injury superimposed on CKD ([place]) 03/24/2019  . Hypotension 03/24/2019  . Dark emesis 03/24/2019  . Dark stools 03/24/2019  . Diverticulitis of intestine without perforation or abscess without bleeding   . Sinus tachycardia 08/27/2016  . Diverticulitis 08/26/2016  . HYPERTENSION, MALIGNANT ESSENTIAL 06/24/2007  . KIDNEY DISEASE, CHRONIC, STAGE III 06/24/2007  . ARTHRITIS, RHEUMATOID, SEROPOSITIVE 06/24/2007    Past Surgical History:  Procedure Laterality Date  . BIOPSY  04/01/2019   Procedure: BIOPSY;  Surgeon: Otis Brace, MD;  Location: [facility];  Service: Gastroenterology;;  . COLONOSCOPY WITH  PROPOFOL N/A 04/01/2019   Procedure: COLONOSCOPY WITH PROPOFOL;  Surgeon: Otis Brace, MD;  Location: [facility];  Service: Gastroenterology;  Laterality: N/A;  . ESOPHAGOGASTRODUODENOSCOPY (EGD) WITH PROPOFOL N/A 04/01/2019   Procedure: ESOPHAGOGASTRODUODENOSCOPY (EGD) WITH PROPOFOL;  Surgeon: Otis Brace, MD;  Location: [facility];  Service: Gastroenterology;  Laterality: N/A;  . TONSILLECTOMY       OB History   No obstetric history on file.      Home Medications    Prior to Admission medications   Medication Sig Start Date End Date Taking? Authorizing Provider  amLODipine (NORVASC) 10 MG tablet Take 10 mg by mouth daily. 03/01/19   [provider]  antiseptic oral rinse (BIOTENE) LIQD 15 mLs by Mouth Rinse route as needed for dry mouth. 05/16/19   Jose Persia, MD  aspirin EC 81 MG EC tablet Take 1 tablet (81 mg total) by mouth daily. 05/16/19   Jose Persia, MD  calcium carbonate (OSCAL) 1500 (600 Ca) MG TABS tablet Take 600 mg of elemental calcium by mouth daily.    [provider]  Carboxymethylcellul-Glycerin (CLEAR EYES FOR DRY EYES OP) Place 2 drops into both eyes daily as needed (dry eyes).    [provider]  cloNIDine (CATAPRES) 0.3 MG tablet Take 0.3 mg by mouth daily. 03/01/19   [provider]  clopidogrel (PLAVIX) 75 MG tablet Take 1 tablet (75 mg total) by mouth daily. 04/05/19   Aline August, MD  colestipol (COLESTID) 1 g tablet Take 1 tablet (1 g total) by mouth 2 (two) times daily. 05/16/19   Verdene Lennert, MD  DULoxetine (CYMBALTA) 30 MG capsule Take 1 capsule (30 mg total) by mouth daily. 05/16/19   Verdene Lennert, MD  furosemide (LASIX) 40 MG tablet Take 1 tablet (40 mg total) by mouth daily. 04/05/19   Glade Lloyd, MD  gabapentin (NEURONTIN) 600 MG tablet Take 0.5 tablets (300 mg total) by mouth 3 (three) times daily. 05/16/19   Verdene Lennert, MD  hydroxychloroquine (PLAQUENIL) 200 MG tablet Take 200 mg by mouth 2  (two) times daily.  06/16/16   [provider]  metoprolol tartrate (LOPRESSOR) 100 MG tablet Take 100 mg by mouth 2 (two) times daily.  11/19/15   [provider]  Misc Natural Products (OSTEO BI-FLEX JOINT SHIELD PO) Take 1 tablet by mouth daily.    [provider]  Multiple Vitamins-Minerals (CENTRUM SILVER 50+WOMEN PO) Take 1 tablet by mouth daily.    [provider]  ondansetron (ZOFRAN) 4 MG tablet Take 1 tablet (4 mg total) by mouth every 6 (six) hours as needed for nausea. 04/04/19   Glade Lloyd, MD  pantoprazole (PROTONIX) 40 MG tablet Take 1 tablet (40 mg total) by mouth 2 (two) times daily. 04/04/19   Glade Lloyd, MD  terbinafine (LAMISIL) 250 MG tablet Take 250 mg by mouth daily. 03/15/18   [provider]  traZODone (DESYREL) 100 MG tablet Take 100 mg by mouth at bedtime.  03/01/19   [provider]    Family History Family History  Problem Relation Age of Onset  . Hypertension Mother   . Diabetes Mother   . CAD Father        died of MI at age 28  . Hypertension Father   . Diabetes Sister   . Diabetes Sister   . Kidney disease Neg Hx     Social History Social History   Tobacco Use  . Smoking status: Never Smoker  . Smokeless tobacco: Never Used  Substance Use Topics  . Alcohol use: Yes    Alcohol/week: 21.0 standard drinks    Types: 21 Glasses of wine per week    Comment: sometimes   . Drug use: No     Allergies   Patient has no known allergies.   Review of Systems Review of Systems 10 Systems reviewed and are negative for acute change except as noted in the HPI.   Physical Exam Updated Vital Signs BP 98/67   Pulse 79   Temp 98.3 F (36.8 C) (Oral)   Resp (!) 22   Ht 5' 6.5" (1.689 m)   Wt 90.8 kg   SpO2 99%   BMI 31.83 kg/m   Physical Exam Constitutional:      Comments: Patient is alert and nontoxic.  She does not have respiratory distress but appears generally fatigued and deconditioned.   HENT:     Head: Normocephalic and atraumatic.     Nose: Nose normal.     Mouth/Throat:     Mouth: Mucous membranes are moist.     Pharynx: Oropharynx is clear.  Eyes:     Extraocular Movements: Extraocular movements intact.     Conjunctiva/sclera: Conjunctivae normal.     Pupils: Pupils are equal, round, and reactive to light.  Cardiovascular:     Rate and Rhythm: Normal rate and regular rhythm.     Pulses: Normal pulses.     Heart sounds: Normal heart sounds.  Pulmonary:     Effort: Pulmonary effort is normal.     Breath sounds: Normal breath sounds.  Abdominal:     General: There is no distension.     Palpations: Abdomen is soft.     Tenderness: There is no abdominal tenderness. There is no guarding.  Musculoskeletal: Normal range of motion.        General: No swelling.     Comments: Patient dorsal some tenderness on the anterior tibial surface of the left lower leg.  No deformities contusions or abrasions visualized.  No wounds or erythema.  She does not have any significant peripheral edema.  No joint deformities.  Neurological:     Comments: Patient is alert and appropriate.  Cognitive function intact.  Nonfocal neurologic exam without focal deficit.  She is generally fatigued and weak but not localizing.  Psychiatric:     Comments: Mood is slightly depressed with flat affect.      ED Treatments / Results  Labs (all labs ordered are listed, but only abnormal results are displayed) Labs Reviewed  CBC WITH DIFFERENTIAL/PLATELET - Abnormal; Notable for the following components:      Result Value   RBC 2.76 (*)    Hemoglobin 8.7 (*)    HCT 26.9 (*)    RDW 18.4 (*)    All other components within normal limits  COMPREHENSIVE METABOLIC PANEL  LACTIC ACID, PLASMA  LACTIC ACID, PLASMA  CBC WITH DIFFERENTIAL/PLATELET  URINALYSIS, ROUTINE W REFLEX MICROSCOPIC  RAPID URINE DRUG SCREEN, HOSP PERFORMED  AMMONIA  MAGNESIUM  PHOSPHORUS  TSH  CBG MONITORING, ED  TROPONIN I  (HIGH SENSITIVITY)  TROPONIN I (HIGH SENSITIVITY)    EKG EKG Interpretation  Date/Time:  Saturday May 28 2019 17:14:05 EDT Ventricular Rate:  77 PR Interval:    QRS Duration: 103 QT Interval:  435 QTC Calculation: 493 R Axis:   -25 Text Interpretation:  Sinus rhythm Paired ventricular premature complexes Borderline left axis deviation RSR' in V1 or V2, right VCD or RVH Borderline prolonged QT interval no change from previous Confirmed by Arby BarrettePfeiffer, Zakkiyya Barno [phone]([id number]) on 05/28/2019 6:50:52 PM   Radiology Dg Tibia/fibula Left  Result Date: 05/28/2019 CLINICAL DATA:  All over lower left leg pain. Pt states it has been painful for months. States she was just in the hospital for colitis. No hx of injury to the lower left leg. EXAM: LEFT TIBIA AND FIBULA - 2 VIEW COMPARISON:  None. FINDINGS: No fracture or bone lesion. Knee and ankle joints are normally spaced and aligned. Soft tissues are unremarkable. IMPRESSION: Negative. Electronically Signed   By: Amie Portlandavid  Ormond M.D.   On: 05/28/2019 18:07    Procedures Procedures (including critical care time)  Medications Ordered in ED Medications  sodium chloride 0.9 % bolus 500 mL (has no administration in time range)    Followed by  0.9 %  sodium chloride infusion (has no administration in time range)     Initial Impression / Assessment and Plan / ED Course  I have reviewed the triage vital signs and the nursing notes.  Pertinent labs & imaging results that were available during my care of the patient were reviewed by me and considered in my medical decision making (see chart for details).       Patient has had similar symptoms for a number of months.  She had admission with extensive evaluation including MRIs of the spine and brain.  She had immunological evaluation.  No definitive identification for the patient's symptoms.  She is hypotensive today.  Will do some fluid hydration and reassess.  Final disposition pending completion of  diagnostic evaluation and patient clinical condition.  Final Clinical Impressions(s) / ED Diagnoses   Final diagnoses:  None    ED Discharge Orders    None       Arby Barrette, MD 05/29/19 1144

## 2019-05-28 NOTE — ED Triage Notes (Signed)
Pt brought in by [facility] from home with c/o generalized weakness x10 days since D/C from hospital. Pt was admitted for colitis/CVA. Pt has left sided weakness at baseline, endorses increase in weakness x10 days. Pt states she has been unable to ambulate since returning home from the hospital. Per EMS pt was not orthostatic, but BP running 90 systolic. Pt has hx of htn, did not take her medication today. Pt endorses a decrease in PO intake for several days, endorses x1 episode of emesis yesterday. Pt states she is nauseas but that it is her baseline. Pt A+Ox4, pale, skin cool to touch on arrival to ED. Pt appears fatigued, slow to respond to questions.
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
## 2019-05-28 NOTE — ED Provider Notes (Signed)
Care handoff received from Dr. Johnney Killian at shift change please see her note for further details.  In short 68 year old female here for generalized weakness and left foot pain.  Symptoms ongoing for many months.  Increasing fatigue and weakness.    Initial troponin 10 Phosphorus 3.5 Magnesium 1.5 CBC with hemoglobin 8.7, appears baseline CBG 89 DG left tib-fib:    IMPRESSION:  Negative.   EKG: Sinus rhythm Paired ventricular premature complexes Borderline left axis deviation RSR' in V1 or V2, right VCD or RVH Borderline prolonged QT interval no change from previous Confirmed by Charlesetta Shanks [phone]) on 05/28/2019 6:50:52 PM - Initially hypotensive fluid bolus ordered.  Remainder of blood work ordered.  Plan of care is to follow-up on remaining lab work and disposition accordingly. Physical Exam  BP 98/67   Pulse 79   Temp 98.3 F (36.8 C) (Oral)   Resp (!) 22   Ht 5' 6.5" (1.689 m)   Wt 90.8 kg   SpO2 99%   BMI 31.83 kg/m   Physical Exam Constitutional:      General: She is not in acute distress.    Appearance: Normal appearance. She is not ill-appearing or diaphoretic.     Comments: Tired appearing  HENT:     Head: Normocephalic and atraumatic.     Nose: Nose normal.  Neck:     Musculoskeletal: Normal range of motion and neck supple.  Cardiovascular:     Rate and Rhythm: Normal rate and regular rhythm.     Pulses:          Dorsalis pedis pulses are 2+ on the right side and 2+ on the left side.  Pulmonary:     Effort: Pulmonary effort is normal. No respiratory distress.     Breath sounds: Normal breath sounds.  Abdominal:     General: Abdomen is flat. There is no distension.     Tenderness: There is no abdominal tenderness. There is no guarding or rebound.  Musculoskeletal: Normal range of motion.     Right lower leg: No edema.     Left lower leg: No edema.  Skin:    General: Skin is warm and dry.  Neurological:     Mental Status: She is alert.  Psychiatric:         Mood and Affect: Mood normal.        Behavior: Behavior normal. Behavior is cooperative.    ED Course/Procedures      Procedures  MDM  CMP with potassium 5.8, creatinine 2.26, patient with AKI, fluid bolus running patient will need admission for AKI and hypotension.  Lactic, TSH, ammonia, UDS, urinalysis pending. - Patient reassessed resting comfortably, fatigued appearing and in no acute distress.  Hypotension improving following fluid bolus.  States understanding of care plan and is agreeable for admission at this time she has no further questions. - Discussed case with hospitalist service, will be seeing patient for admission here in the ED.   Note: Portions of this report may have been transcribed using voice recognition software. Every effort was made to ensure accuracy; however, inadvertent computerized transcription errors may still be present.   Gari Crown 05/28/19 2048    Charlesetta Shanks, MD 05/29/19 1144

## 2019-05-28 NOTE — Telephone Encounter (Signed)
Yes that is fine.  Thank you.

## 2019-05-28 NOTE — ED Notes (Signed)
Attempted to call report x 1  

## 2019-05-28 NOTE — ED Notes (Signed)
Please call patients daughter Inocencio Homes at [phone] and give her an update.

## 2019-05-29 ENCOUNTER — Inpatient Hospital Stay (HOSPITAL_COMMUNITY): Payer: Medicare Other

## 2019-05-29 LAB — VITAMIN B12: Vitamin B-12: 1209 pg/mL — ABNORMAL HIGH (ref 180–914)

## 2019-05-29 LAB — SODIUM, URINE, RANDOM: Sodium, Ur: 113 mmol/L

## 2019-05-29 LAB — SARS CORONAVIRUS 2 (TAT 6-24 HRS): SARS Coronavirus 2: NEGATIVE

## 2019-05-29 LAB — BASIC METABOLIC PANEL
Anion gap: 9 (ref 5–15)
BUN: 22 mg/dL (ref 8–23)
CO2: 24 mmol/L (ref 22–32)
Calcium: 8.2 mg/dL — ABNORMAL LOW (ref 8.9–10.3)
Chloride: 103 mmol/L (ref 98–111)
Creatinine, Ser: 2.02 mg/dL — ABNORMAL HIGH (ref 0.44–1.00)
GFR calc Af Amer: 29 mL/min — ABNORMAL LOW (ref >60)
GFR calc non Af Amer: 25 mL/min — ABNORMAL LOW (ref >60)
Glucose, Bld: 92 mg/dL (ref 70–99)
Potassium: 3.9 mmol/L (ref 3.5–5.1)
Sodium: 136 mmol/L (ref 135–145)

## 2019-05-29 LAB — IRON AND TIBC
Iron: 29 ug/dL (ref 28–170)
Saturation Ratios: 15 % (ref 10.4–31.8)
TIBC: 199 ug/dL — ABNORMAL LOW (ref 250–450)
UIBC: 170 ug/dL

## 2019-05-29 LAB — CREATININE, URINE, RANDOM: Creatinine, Urine: 42.33 mg/dL

## 2019-05-29 LAB — RETICULOCYTES
Immature Retic Fract: 20.9 % — ABNORMAL HIGH (ref 2.3–15.9)
RBC.: 2.81 MIL/uL — ABNORMAL LOW (ref 3.87–5.11)
Retic Count, Absolute: 47.8 10*3/uL (ref 19.0–186.0)
Retic Ct Pct: 1.7 % (ref 0.4–3.1)

## 2019-05-29 LAB — LACTIC ACID, PLASMA: Lactic Acid, Venous: 1 mmol/L (ref 0.5–1.9)

## 2019-05-29 LAB — FOLATE: Folate: 21.2 ng/mL (ref >5.9)

## 2019-05-29 LAB — FERRITIN: Ferritin: 990 ng/mL — ABNORMAL HIGH (ref 11–307)

## 2019-05-29 MED ORDER — SORBITOL 70 % SOLN
960.0000 mL | TOPICAL_OIL | Freq: Once | ORAL | Status: AC
  Administered 2019-05-29: 960 mL via RECTAL
  Filled 2019-05-29: qty 473

## 2019-05-29 MED ORDER — PANTOPRAZOLE SODIUM 40 MG PO TBEC
40.0000 mg | DELAYED_RELEASE_TABLET | Freq: Two times a day (BID) | ORAL | Status: DC
  Administered 2019-05-29 – 2019-06-01 (×8): 40 mg via ORAL
  Filled 2019-05-29 (×7): qty 1

## 2019-05-29 MED ORDER — MAGNESIUM SULFATE 2 GM/50ML IV SOLN
2.0000 g | Freq: Once | INTRAVENOUS | Status: AC
  Administered 2019-05-29: 2 g via INTRAVENOUS
  Filled 2019-05-29: qty 50

## 2019-05-29 NOTE — Evaluation (Signed)
Physical Therapy Evaluation Patient Details Name: Michelle Graves MRN: [id number] DOB: 1951/04/17 Today's Date: 05/29/2019   History of Present Illness  68 y.o. female with medical history significant of CVA with left-sided weakness, CMV colitis, anemia, hypertension and rheumatoid arthritis.  Patient has had multiple hospitalizations over the last several months for CMV colitis.  She also has been evaluated for increasing weakness in her legs. July 17 to May 16, 2019 hopitalization she had a complete work-up and neurology consultation--final diagnosis was deconditioning exacerbated by left ankle sprain. due to increasing weakness and inability to manage her ADLs she returned to her daughter's home. SARS negative this admission  Clinical Impression   Pt admitted with above diagnosis. Patient seen later in the day and was very tired, drowsy. She wanted to work with PT, however easily drifting off to sleep. She was able to get OOB to chair with OT earlier today and noted nursing used Stedy lift with 2 person assist to get from recliner to bed. Will continue to assess followup needs as she is more alert. Pt currently with functional limitations due to the deficits listed below (see PT Problem List).  Pt will benefit from skilled PT to increase their independence and safety with mobility to allow discharge to the venue listed below.       Follow Up Recommendations SNF    Equipment Recommendations  None recommended by PT    Recommendations for Other Services       Precautions / Restrictions Precautions Precautions: None Restrictions Weight Bearing Restrictions: No      Mobility  Bed Mobility Overal bed mobility: Needs Assistance Bed Mobility: Supine to Sit     Supine to sit: Mod assist     General bed mobility comments: mod assist to unsupported long-sitting to adjust her pillows for more upright position  Transfers                 General transfer comment: pt too  fatigued, drowsy to participate; noted Stedy in room which nursing used for chair to bed after up with OT earlier today  Ambulation/Gait                Stairs            Wheelchair Mobility    Modified Rankin (Stroke Patients Only)       Balance Overall balance assessment: Needs assistance Sitting-balance support: Bilateral upper extremity supported Sitting balance-[name]: Poor Sitting balance - Comments: demonstrated decreased core strength;required vc to reposition to upright posture  Postural control: Right lateral lean;Posterior lean                                   Pertinent Vitals/Pain Pain Assessment: No/denies pain    Home Living Family/patient expects to be discharged to:: Private residence Living Arrangements: Children Available Help at Discharge: Family;Available 24 hours/day Type of Home: House Home Access: Stairs to enter Entrance Stairs-Rails: None Entrance Stairs-Number of Steps: 2 Home Layout: Able to live on main level with bedroom/bathroom Home Equipment: Walker - 2 wheels;Walker - 4 wheels      Prior Function Level of Independence: Needs assistance   Gait / Transfers Assistance Needed: use of RW for mobility;daughter provided assistance;reports ambulating unassisted in April   ADL's / Homemaking Assistance Needed: requires assistance from daughter for all ADL        Hand Dominance   Dominant Hand: Right  Extremity/Trunk Assessment   Upper Extremity Assessment Upper Extremity Assessment: Defer to OT evaluation    Lower Extremity Assessment Lower Extremity Assessment: Generalized weakness;LLE deficits/detail LLE Deficits / Details: AAROM and MMT with no pain reported; quads 4-, ankle DF 4+ (in supine) LLE Sensation: decreased light touch LLE Coordination: decreased gross motor       Communication   Communication: No difficulties(soft spoken)  Cognition Arousal/Alertness: Lethargic(very tired,  sleepy) Behavior During Therapy: Flat affect Overall Cognitive Status: No family/caregiver present to determine baseline cognitive functioning                                 General Comments: patient skipping topic to topic; requires questions repeated due to long delay prior to answering and forgets what was asked      General Comments      Exercises Other Exercises Other Exercises: heelslides with resisted extension x 3 LLE for strength and attempt to incr alertness   Assessment/Plan    PT Assessment Patient needs continued PT services  PT Problem List Decreased strength;Decreased range of motion;Decreased activity tolerance;Decreased balance;Decreased mobility;Decreased safety awareness;Decreased knowledge of use of DME;Decreased cognition;Impaired sensation       PT Treatment Interventions DME instruction;Gait training;Stair training;Functional mobility training;Therapeutic activities;Therapeutic exercise;Balance training;Cognitive remediation;Patient/family education;Neuromuscular re-education    PT Goals (Current goals can be found in the Care Plan section)  Acute Rehab PT Goals Patient Stated Goal: to get stronger before going home PT Goal Formulation: With patient Time For Goal Achievement: 06/12/19 Potential to Achieve Goals: Fair    Frequency Min 3X/week   Barriers to discharge        Co-evaluation               AM-PAC PT "6 Clicks" Mobility  Outcome Measure Help needed turning from your back to your side while in a flat bed without using bedrails?: A Lot Help needed moving from lying on your back to sitting on the side of a flat bed without using bedrails?: A Lot Help needed moving to and from a bed to a chair (including a wheelchair)?: Total Help needed standing up from a chair using your arms (e.g., wheelchair or bedside chair)?: Total Help needed to walk in hospital room?: Total Help needed climbing 3-5 steps with a railing? : Total 6  Click Score: 8    End of Session   Activity Tolerance: Patient limited by lethargy;Patient limited by fatigue Patient left: in bed;with call bell/phone within reach;with bed alarm set   PT Visit Diagnosis: Hemiplegia and hemiparesis;Muscle weakness (generalized) (M62.81) Hemiplegia - Right/Left: Left Hemiplegia - dominant/non-dominant: Non-dominant Hemiplegia - caused by: Cerebral infarction    Time: 8786-7672 PT Time Calculation (min) (ACUTE ONLY): 20 min   Charges:   PT Evaluation $PT Eval Moderate Complexity: 1 Mod            Barry Brunner, PT      Rexanne Mano 05/29/2019, 5:53 PM

## 2019-05-29 NOTE — Progress Notes (Signed)
Patient arrived to 6N25, A&Ox4, VSS, and IV intact.  Patient oriented to room and equipment.  Will continue to monitor.

## 2019-05-29 NOTE — Progress Notes (Signed)
Occupational Therapy Evaluation Patient Details Name: Michelle Graves MRN: [id number] DOB: [birth year] Today's Date: 05/29/2019    History of Present Illness Pt is a 68 yo female s/p fall, weak and very small R frontal lobe CVA with BLE weakness. with MRI for thoracic spine is negative and MRI of L4-5 showed stenosis. Pt PMHx: arthritis, chest pain and HTN, CVA in 02/2019.   Clinical Impression   PTA, pt was living at home alone prior to recent illness when she then began living with her daughter, Pt reports she required assistance from her daughter with all ADL/IADL and functional mobility with use of RW. Pt currently requires maxA for LB dressing, setupA for grooming and eating, and minA for UB ADL while sitting unsupported. Pt required maxA for stand-pivot to the recliner. Pt agreeable to sub acute therapy prior to returning home. Due to decline in current level of function, pt would benefit from acute OT to address established goals to facilitate safe D/C to venue listed below. At this time, recommend SNF follow-up. Will continue to follow acutely.     Follow Up Recommendations  SNF    Equipment Recommendations  3 in 1 bedside commode    Recommendations for Other Services PT consult     Precautions / Restrictions Precautions Precautions: None Restrictions Weight Bearing Restrictions: No      Mobility Bed Mobility Overal bed mobility: Needs Assistance Bed Mobility: Supine to Sit     Supine to sit: Mod assist     General bed mobility comments: modA for BLE management and to elevate trunk to upright position  Transfers Overall transfer level: Needs assistance Equipment used: 1 person hand held assist Transfers: Sit to/from Stand;Stand Pivot Transfers Sit to Stand: Max assist Stand pivot transfers: Max assist       General transfer comment: MaxA for 1 person face to face transfer;maxA for powerup, stability and pivotvc for sequencing during pivot    Balance Overall  balance assessment: Needs assistance Sitting-balance support: Feet supported;Single extremity supported Sitting balance-[name]: Fair Sitting balance - Comments: demonstrated decreased core strength;required vc to reposition to upright posture while sitting EOB;reliant on leaning on RUE support while sitting EOB;poor tolerance Postural control: Right lateral lean;Posterior lean Standing balance support: Bilateral upper extremity supported Standing balance-[name]: Zero Standing balance comment: maxA for support                            ADL either performed or assessed with clinical judgement   ADL Overall ADL's : Needs assistance/impaired Eating/Feeding: Set up;Sitting   Grooming: Set up;Sitting   Upper Body Bathing: Set up;Sitting   Lower Body Bathing: Maximal assistance;Sit to/from stand   Upper Body Dressing : Sitting;Minimal assistance   Lower Body Dressing: Sitting/lateral leans;Sit to/from stand;Maximal assistance Lower Body Dressing Details (indicate cue type and reason): required assistance to don socks;maxA to stand Toilet Transfer: Maximal assistance;Stand-pivot Toilet Transfer Details (indicate cue type and reason): simulated stand-pivot to recliner;maxA with face to face transfer Toileting- Clothing Manipulation and Hygiene: Maximal assistance;Sitting/lateral lean;Sit to/from stand       Functional mobility during ADLs: Maximal assistance;Cueing for safety;Cueing for sequencing General ADL Comments: Pt required assistance for ADL due to muscle weakness, fatigue and poor activity tolerance     Vision Baseline Vision/History: No visual deficits Vision Assessment?: No apparent visual deficits     Perception     Praxis      Pertinent Vitals/Pain Pain Assessment: 0-10 Pain  Score: 5  Pain Location: pain in L ankle Pain Descriptors / Indicators: Constant;Sore Pain Intervention(s): Limited activity within patient's tolerance;Monitored during  session     Hand Dominance Right   Extremity/Trunk Assessment Upper Extremity Assessment Upper Extremity Assessment: Generalized weakness   Lower Extremity Assessment Lower Extremity Assessment: Generalized weakness;LLE deficits/detail LLE Deficits / Details: pain at all times;no increase in pain with mobility;decreased hip flexion strength unable to perform isometric hold LLE Sensation: WNL LLE Coordination: decreased fine motor;decreased gross motor   Cervical / Trunk Assessment Cervical / Trunk Assessment: Normal   Communication Communication Communication: No difficulties(soft spoken)   Cognition Arousal/Alertness: Awake/alert Behavior During Therapy: WFL for tasks assessed/performed Overall Cognitive Status: No family/caregiver present to determine baseline cognitive functioning                                 General Comments: family reports she sometimes forget she "cant really walk";decreased awareness of deficits requesting to ambulate to bathroom, then reports she doesn't do much walking at home;pt vague with amount of assist/level of independence   General Comments  pt reports dizziness with initial sitting EOB, subsided after 30 seconds    Exercises Exercises: Other exercises Other Exercises Other Exercises: Educated pt on general UE exercises to perform 3x/day   Shoulder Instructions      Home Living Family/patient expects to be discharged to:: Private residence Living Arrangements: Children Available Help at Discharge: Family;Available 24 hours/day Type of Home: House Home Access: Stairs to enter [employer] of Steps: 2 Entrance Stairs-Rails: None Home Layout: Able to live on main level with bedroom/bathroom     Bathroom Shower/Tub: Chief Strategy Officer: Standard Bathroom Accessibility: Yes   Home Equipment: Environmental consultant - 2 wheels;Walker - 4 wheels          Prior Functioning/Environment Level of Independence:  Needs assistance  Gait / Transfers Assistance Needed: use of RW for mobility;daughter provided assistance;reports ambulating unassisted in April  ADL's / Homemaking Assistance Needed: requires assistance from daughter for all ADL            OT Problem List: Decreased strength;Decreased activity tolerance;Impaired balance (sitting and/or standing);Decreased safety awareness;Decreased coordination;Pain      OT Treatment/Interventions: Self-care/ADL training;Therapeutic exercise;Neuromuscular education;Energy conservation;DME and/or AE instruction;Therapeutic activities;Patient/family education;Balance training    OT Goals(Current goals can be found in the care plan section) Acute Rehab OT Goals Patient Stated Goal: to get stronger before going home OT Goal Formulation: With patient Time For Goal Achievement: 06/12/19 Potential to Achieve Goals: Good ADL Goals Pt Will Perform Grooming: with modified independence Pt Will Perform Upper Body Dressing: with modified independence;sitting Pt Will Perform Lower Body Dressing: with modified independence;with adaptive equipment;sit to/from stand Pt Will Transfer to Toilet: with modified independence;ambulating  OT Frequency: Min 2X/week   Barriers to D/C: Decreased caregiver support  pt reports her daughter is available to assist       Co-evaluation              AM-PAC OT "6 Clicks" Daily Activity     Outcome Measure Help from another person eating meals?: A Little Help from another person taking care of personal grooming?: A Little Help from another person toileting, which includes using toliet, bedpan, or urinal?: A Lot Help from another person bathing (including washing, rinsing, drying)?: A Lot Help from another person to put on and taking off regular upper body clothing?: A Little Help from  another person to put on and taking off regular lower body clothing?: A Lot 6 Click Score: 15   End of Session Equipment Utilized During  Treatment: Gait belt;Rolling walker Nurse Communication: Mobility status  Activity Tolerance: Patient tolerated treatment well;Patient limited by fatigue Patient left: in chair;with call bell/phone within reach;with chair alarm set  OT Visit Diagnosis: Unsteadiness on feet (R26.81);Muscle weakness (generalized) (M62.81)                Time: 3007-6226 OT Time Calculation (min): 30 min Charges:  OT General Charges $OT Visit: 1 Visit OT Evaluation $OT Eval Moderate Complexity: 1 Mod OT Treatments $Self Care/Home Management : 8-22 mins  Diona Browner OTR/L Acute Rehabilitation Services Office: [phone]   Rebeca Alert 05/29/2019, 9:44 AM

## 2019-05-29 NOTE — Progress Notes (Addendum)
PROGRESS NOTE    Michelle MallingBarbara J Mathurin  XBJ:[id number] DOB: 01/10/1951 DOA: 05/28/2019 PCP: [facility]   Brief Narrative:  Per HPI: Michelle MallingBarbara J Lannom is a 68 y.o. female with medical history significant of CVA with left-sided weakness, CMV colitis, anemia, hypertension and rheumatoid arthritis.  Patient has had multiple hospitalizations over the last several months for CMV colitis.  She also has been evaluated for increasing weakness in her legs.  During her admission at [facility] from July 17 to May 16, 2019 she had a complete work-up and neurology consultation.  Serologies included CK, Aldotase, LDH, anti-Jo, anti-Ro, anti-La, and anti-Smith antibodies.  She had anti-VG CC antibodies tested.  She had MRI of the thoracic and lumbar spine which did reveal spinal stenosis in the lumbar region but not to a degree that would cause lower extremity weakness.  Final diagnosis was deconditioning exacerbated by left ankle sprain.  Patient did return home for short time but then due to increasing weakness and inability to manage her ADLs he returned to her daughter's home.  For the past several days the patient's had increasing weakness generally.  She has had increasing nausea and multiple episodes of vomiting day prior to admission.  She admits to poor p.o. intake of both food and fluids.  Because of her symptoms and weakness she was brought by ambulance to the [facility] emergency department.  She denies any acute pain or any focal neurologic symptoms.  She has completed a full course of ganciclovir for CMV colitis.   Patient was admitted with AKI secondary to poor oral intake with recurrent nausea and vomiting with eating.  She was recently treated for CMV colitis and was scheduled to see her gastroenterologist on 8/12.  She was previously noted to have linear gastric ulcerations on endoscopy on 03/2019 along with colonoscopy at that time with ulcerations throughout her  colon concerning for ischemic colitis.  She appears to be having some improvement in her creatinine levels with prior baseline between 1.2-1.7.  Assessment & Plan:   Active Problems:   Weakness of both lower extremities   Small vessel disease, cerebrovascular   Acute left ankle pain   AKI (acute kidney injury) (HCC)   Nausea & vomiting   CMV colitis (HCC)   Anemia, chronic disease   AKI-likely prerenal due to poor oral intake (baseline 1.2-1.7)-improving -Continue on aggressive IV fluid -Monitor intake and output -Avoid nephrotoxic agents -Recheck renal panel in a.m.  Intermittent abdominal pain with nausea and vomiting -Patient has had recent upper and lower endoscopy on 03/2019 with linear gastric erosions as well as findings of ischemic colitis -KUB demonstrates large stool burden and will give enema today -Zofran as needed -Reassess in a.m. and consider further imaging versus GI consultation as needed -Nutrition supplementation for now  Prior CMV colitis status post treatment with ganciclovir -Currently without acute symptomatology related to this -Continue to monitor closely and will need GI follow-up outpatient  Persistent lower extremity weakness-identified in setting of prior CVA -Appreciate OT evaluation and PT evaluation -So far appears to require SNF/rehab -Continue aspirin and Plavix treatment  Chronic anemia-stable; likely iron deficiency based on prior anemia panel -Continue on PPI twice daily for now -We will get stool occult as well as anemia panel after enema -We will administer Feraheme as needed  Hypertension-soft blood pressure readings -Hold home amlodipine and metoprolol for now -Continue clonidine and monitor closely  Protein calorie malnutrition -Nutrition/dietitian consult   DVT prophylaxis:  Currently on aspirin and Plavix, will add SCDs Code Status: Partial-no intubation Family Communication: Discussed over phone with daughter Disposition  Plan: Plan to give enema today for large stool burden noted on KUB and reassess ability to eat and abdominal pain.  May require CTA of abdomen and pelvis due to prior findings of ischemic colitis on colonoscopy 03/2019 if unimproved.  Will need AKI to improve first and will remain on IV fluid.  Will consider GI consultation if not improving after enema.   Consultants:   None  Procedures:   None  Antimicrobials:   None   Subjective: Patient seen and evaluated today with ongoing weakness and malaise.  She has a breakfast tray in front of her and states she has very little appetite and feels somewhat nauseous.  Continues to complain of diffuse abdominal pain.  Objective: Vitals:   05/28/19 2100 05/28/19 2150 05/29/19 0521 05/29/19 1418  BP: 123/77 111/74 104/70 92/65  Pulse: 80 80 91 87  Resp: (!) 22 16 (!) 22   Temp:  98.1 F (36.7 C) 98.6 F (37 C) 98.6 F (37 C)  TempSrc:  Oral Oral Oral  SpO2: 96% (!) 86% 96% 97%  Weight:  84.5 kg    Height:  5\' 6"  (1.676 m)      Intake/Output Summary (Last 24 hours) at 05/29/2019 1423 Last data filed at 05/28/2019 2307 Gross per 24 hour  Intake 862.18 ml  Output -  Net 862.18 ml   Filed Weights   05/28/19 1753 05/28/19 2150  Weight: 90.8 kg 84.5 kg    Examination:  General exam: Appears calm and comfortable  Respiratory system: Clear to auscultation. Respiratory effort normal. Cardiovascular system: S1 & S2 heard, RRR. No JVD, murmurs, rubs, gallops or clicks. No pedal edema. Gastrointestinal system: Abdomen is nondistended, soft and nontender. No organomegaly or masses felt. Normal bowel sounds heard. Central nervous system: Alert and oriented. No focal neurological deficits. Extremities: Symmetric 5 x 5 power. Skin: No rashes, lesions or ulcers Psychiatry: Depressed mood.    Data Reviewed: I have personally reviewed following labs and imaging studies  CBC: Recent Labs  Lab 05/28/19 1743  WBC 4.9  NEUTROABS 2.3  HGB  8.7*  HCT 26.9*  MCV 97.5  PLT 271   Basic Metabolic Panel: Recent Labs  Lab 05/28/19 1846 05/29/19 0015  NA 133* 136  K 5.8* 3.9  CL 99 103  CO2 25 24  GLUCOSE 89 92  BUN 24* 22  CREATININE 2.26* 2.02*  CALCIUM 8.3* 8.2*  MG 1.5*  --   PHOS 3.5  --    GFR: Estimated Creatinine Clearance: 30 mL/min (A) (by C-G formula based on SCr of 2.02 mg/dL (H)). Liver Function Tests: Recent Labs  Lab 05/28/19 1846  AST 37  ALT 12  ALKPHOS 89  BILITOT 1.2  PROT 6.2*  ALBUMIN 2.4*   No results for input(s): LIPASE, AMYLASE in the last 168 hours. Recent Labs  Lab 05/28/19 2025  AMMONIA 18   Coagulation Profile: No results for input(s): INR, PROTIME in the last 168 hours. Cardiac Enzymes: No results for input(s): CKTOTAL, CKMB, CKMBINDEX, TROPONINI in the last 168 hours. BNP (last 3 results) No results for input(s): PROBNP in the last 8760 hours. HbA1C: No results for input(s): HGBA1C in the last 72 hours. CBG: Recent Labs  Lab 05/28/19 1647  GLUCAP 89   Lipid Profile: No results for input(s): CHOL, HDL, LDLCALC, TRIG, CHOLHDL, LDLDIRECT in the last 72 hours. Thyroid Function Tests:  Recent Labs    05/28/19 2025  TSH 1.137   Anemia Panel: No results for input(s): VITAMINB12, FOLATE, FERRITIN, TIBC, IRON, RETICCTPCT in the last 72 hours. Sepsis Labs: Recent Labs  Lab 05/28/19 2025 05/29/19 0015  LATICACIDVEN 0.9 1.0    Recent Results (from the past 240 hour(s))  SARS CORONAVIRUS 2 Nasal Swab Aptima Multi Swab     Status: None   Collection Time: 05/28/19  8:58 PM   Specimen: Aptima Multi Swab; Nasal Swab  Result Value Ref Range Status   SARS Coronavirus 2 NEGATIVE NEGATIVE Final    Comment: (NOTE) SARS-CoV-2 target nucleic acids are NOT DETECTED. The SARS-CoV-2 RNA is generally detectable in upper and lower respiratory specimens during the acute phase of infection. Negative results do not preclude SARS-CoV-2 infection, do not rule out co-infections with  other pathogens, and should not be used as the sole basis for treatment or other patient management decisions. Negative results must be combined with clinical observations, patient history, and epidemiological information. The expected result is Negative. Fact Sheet for Patients: SugarRoll.be Fact Sheet for Healthcare Providers: https://www.woods-mathews.com/ This test is not yet approved or cleared by the [place] FDA and  has been authorized for detection and/or diagnosis of SARS-CoV-2 by FDA under an Emergency Use Authorization (EUA). This EUA will remain  in effect (meaning this test can be used) for the duration of the COVID-19 declaration under Section 56 4(b)(1) of the Act, 21 U.S.C. section 360bbb-3(b)(1), unless the authorization is terminated or revoked sooner. Performed at [facility], [place] [address] [place], [place] [address]          Radiology Studies: Dg Tibia/fibula Left  Result Date: 05/28/2019 CLINICAL DATA:  All over lower left leg pain. Pt states it has been painful for months. States she was just in the hospital for colitis. No hx of injury to the lower left leg. EXAM: LEFT TIBIA AND FIBULA - 2 VIEW COMPARISON:  None. FINDINGS: No fracture or bone lesion. Knee and ankle joints are normally spaced and aligned. Soft tissues are unremarkable. IMPRESSION: Negative. Electronically Signed   By: Lajean Manes M.D.   On: 05/28/2019 18:07   Dg Abd 1 View  Result Date: 05/29/2019 CLINICAL DATA:  Abdominal pain with improvement. EXAM: ABDOMEN - 1 VIEW COMPARISON:  CT abdomen pelvis 03/25/2019 FINDINGS: Relative paucity of small bowel gas. Large volume stool throughout the colon. Pelvic phleboliths. Lumbar spine degenerative changes. IMPRESSION: Large volume stool throughout the colon. Paucity of small bowel gas. Electronically Signed   By: Lovey Newcomer M.D.   On: 05/29/2019 11:28        Scheduled Meds: . aspirin EC   81 mg Oral Daily  . cloNIDine  0.3 mg Oral Daily  . clopidogrel  75 mg Oral Daily  . colestipol  1 g Oral BID  . docusate sodium  100 mg Oral BID  . DULoxetine  60 mg Oral Daily  . hydroxychloroquine  200 mg Oral BID  . pantoprazole  40 mg Oral BID  . sorbitol, milk of mag, mineral oil, glycerin (SMOG) enema  960 mL Rectal Once  . traZODone  100 mg Oral QHS   Continuous Infusions: . sodium chloride 125 mL/hr at 05/28/19 2154  . dextrose 5 % and 0.45% NaCl 100 mL/hr at 05/29/19 0934     LOS: 1 day    Time spent: 30 minutes    Shterna Laramee Darleen Crocker, DO [facility] Pager [phone]  If 7PM-7AM, please contact night-coverage  [email or web address] Password [email or web address] 05/29/2019, 2:23 PM

## 2019-05-30 DIAGNOSIS — R29898 Other symptoms and signs involving the musculoskeletal system: Secondary | ICD-10-CM

## 2019-05-30 LAB — CBC
HCT: 25.4 % — ABNORMAL LOW (ref 36.0–46.0)
Hemoglobin: 8.4 g/dL — ABNORMAL LOW (ref 12.0–15.0)
MCH: 31 pg (ref 26.0–34.0)
MCHC: 33.1 g/dL (ref 30.0–36.0)
MCV: 93.7 fL (ref 80.0–100.0)
Platelets: 349 10*3/uL (ref 150–400)
RBC: 2.71 MIL/uL — ABNORMAL LOW (ref 3.87–5.11)
RDW: 18 % — ABNORMAL HIGH (ref 11.5–15.5)
WBC: 5.7 10*3/uL (ref 4.0–10.5)
nRBC: 0 % (ref 0.0–0.2)

## 2019-05-30 LAB — BASIC METABOLIC PANEL
Anion gap: 9 (ref 5–15)
BUN: 21 mg/dL (ref 8–23)
CO2: 24 mmol/L (ref 22–32)
Calcium: 8.1 mg/dL — ABNORMAL LOW (ref 8.9–10.3)
Chloride: 101 mmol/L (ref 98–111)
Creatinine, Ser: 1.75 mg/dL — ABNORMAL HIGH (ref 0.44–1.00)
GFR calc Af Amer: 35 mL/min — ABNORMAL LOW (ref >60)
GFR calc non Af Amer: 30 mL/min — ABNORMAL LOW (ref >60)
Glucose, Bld: 81 mg/dL (ref 70–99)
Potassium: 4 mmol/L (ref 3.5–5.1)
Sodium: 134 mmol/L — ABNORMAL LOW (ref 135–145)

## 2019-05-30 LAB — MAGNESIUM: Magnesium: 1.8 mg/dL (ref 1.7–2.4)

## 2019-05-30 MED ORDER — MEGESTROL ACETATE 40 MG PO TABS: 40.0000 mg | ORAL_TABLET | Freq: Two times a day (BID) | ORAL | Status: DC

## 2019-05-30 MED ORDER — TRAZODONE HCL 100 MG PO TABS
100.0000 mg | ORAL_TABLET | Freq: Once | ORAL | Status: AC
  Administered 2019-05-30: 100 mg via ORAL
  Filled 2019-05-30: qty 1

## 2019-05-30 MED ORDER — ENSURE ENLIVE PO LIQD
237.0000 mL | Freq: Three times a day (TID) | ORAL | Status: DC
  Administered 2019-05-30 – 2019-06-02 (×8): 237 mL via ORAL

## 2019-05-30 MED ORDER — POLYETHYLENE GLYCOL 3350 17 G PO PACK
17.0000 g | PACK | Freq: Every day | ORAL | Status: DC
  Administered 2019-05-30 – 2019-06-01 (×3): 17 g via ORAL
  Filled 2019-05-30 (×4): qty 1

## 2019-05-30 MED ORDER — ENSURE ENLIVE PO LIQD
237.0000 mL | Freq: Two times a day (BID) | ORAL | Status: DC
  Administered 2019-05-30: 237 mL via ORAL

## 2019-05-30 MED ORDER — ADULT MULTIVITAMIN W/MINERALS CH
1.0000 | ORAL_TABLET | Freq: Every day | ORAL | Status: DC
  Administered 2019-05-30 – 2019-06-02 (×4): 1 via ORAL
  Filled 2019-05-30 (×4): qty 1

## 2019-05-30 MED ORDER — SODIUM CHLORIDE 0.9 % IV SOLN
INTRAVENOUS | Status: DC
  Administered 2019-05-30 – 2019-06-01 (×3): via INTRAVENOUS

## 2019-05-30 MED ORDER — WHITE PETROLATUM EX OINT
TOPICAL_OINTMENT | CUTANEOUS | Status: AC
  Administered 2019-05-30: 0.2
  Filled 2019-05-30: qty 28.35

## 2019-05-30 MED ORDER — MEGESTROL ACETATE 400 MG/10ML PO SUSP
400.0000 mg | Freq: Two times a day (BID) | ORAL | Status: DC
  Administered 2019-05-30 – 2019-06-02 (×7): 400 mg via ORAL
  Filled 2019-05-30 (×8): qty 10

## 2019-05-30 NOTE — Plan of Care (Signed)

## 2019-05-30 NOTE — Telephone Encounter (Addendum)
Spoke to daughter, Inocencio Homes.  Ok per JV/NP to be VV.  Will send a email to daughter for [email or web address] visit, confirmed date time.    Email sent [email or web address].

## 2019-05-30 NOTE — Progress Notes (Signed)
PROGRESS NOTE    Fenton MallingBarbara J Mollica  YNW:[id number] DOB: 09/20/1951 DOA: 05/28/2019 [facility]   Brief Narrative:  Per HPI: [name] a 68 y.o.femalewith medical history significant ofCVA with left-sided weakness, CMV colitis, anemia, hypertension and rheumatoid arthritis. Patient has had multiple hospitalizations over the last several months for CMV colitis. She also has been evaluated for increasing weakness in her legs. During [facility] July 17 to May 16, 2019 she had a complete work-up and neurology consultation.Serologiesincluded CK, Aldotase, LDH, anti-Jo, anti-Ro, anti-La, and anti-Smith antibodies. She had anti-VG CC antibodies tested. She had MRI of the thoracic and lumbar spine which did reveal spinal stenosis in the lumbar region but not to a degree that would cause lower extremity weakness. Final diagnosis was deconditioning exacerbated by left ankle sprain. Patient did return home for short time but then due toincreasing weakness and inability to manage her ADLs he returned to her daughter's home. For the past several days the patient's had increasing weakness generally. She has had increasing nausea and multiple episodes of vomiting day prior to admission. She admits to poor p.o. intake of both food and fluids. Because of her symptoms and weakness she was brought by ambulance to [facility] department.She denies any acute pain or any focal neurologic symptoms. She has completed a full course of ganciclovir for CMV colitis.   Patient was admitted with AKI secondary to poor oral intake with recurrent nausea and vomiting with eating.  She was recently treated for CMV colitis and was scheduled to see her gastroenterologist on 8/12.  She was previously noted to have linear gastric ulcerations on endoscopy on 03/2019 along with colonoscopy at that time with ulcerations throughout her  colon concerning for ischemic colitis.  She appears to be having some improvement in her creatinine levels with prior baseline between 1.2-1.7.  8/3: Patient continues to have poor appetite and I have discussed with this with daughter.  Apparently this is been an issue for several months.  She denies any nausea or vomiting with meals or abdominal pain with meals.  She was given enema yesterday with small bowel movement noted.  She continues to feel weak and has been seen by PT with recommendations for SNF on discharge.  AKI appears to be improving and will resume IV fluid for now.  Plan to start on Megace for appetite stimulation today.  Assessment & Plan:   Active Problems:   Weakness of both lower extremities   Small vessel disease, cerebrovascular   Acute left ankle pain   AKI (acute kidney injury) (HCC)   Nausea & vomiting   CMV colitis (HCC)   Anemia, chronic disease   AKI-likely prerenal due to poor oral intake (baseline 1.2-1.7)-improving -Continue on maintenance IV fluid in the face of poor appetite -Monitor intake and output -Avoid nephrotoxic agents -Recheck renal panel in a.m.  Intermittent abdominal pain with nausea and vomiting- resolving, now with just poor appetite -Patient has had recent upper and lower endoscopy on 03/2019 with linear gastric erosions as well as findings of ischemic colitis -KUB demonstrates large stool burden and enema given on 8/2 with minimal bowel movement -We will start MiraLAX daily for now and repeat enema as needed -Zofran as needed -Nutritional supplementation with Ensure -Appetite stimulation with Megace and advance diet -Dietitian consultation ordered  Prior CMV colitis status post treatment with ganciclovir -Currently without acute symptomatology related to this -Continue to monitor closely  and will need GI follow-up outpatient, does not appear to require any inpatient evaluation  Persistent lower extremity weakness-identified in  setting of prior CVA -Appreciate OT evaluation and PT evaluation with recommendations for SNF -Continue aspirin and Plavix treatment  Chronic anemia-stable; likely iron deficiency based on prior anemia panel -Continue on PPI twice daily for now -Stable anemia panel currently  Hypertension-soft blood pressure readings -Hold home amlodipine and metoprolol for now -Hold clonidine for now as well given soft blood pressure readings  Protein calorie malnutrition -Nutrition/dietitian consult -Started on Ensure   DVT prophylaxis: Currently on aspirin and Plavix, will add SCDs Code Status: Partial-no intubation Family Communication: Discussed over phone with daughter Disposition Plan: Plan to give appetite stimulation with Megace today and see if diet improves.  MiraLAX daily for laxation.  Will need SNF on discharge.   Consultants:   None  Procedures:   None  Antimicrobials:   None  Subjective: Patient seen and evaluated today with no new acute complaints or concerns. No acute concerns or events noted overnight.  Continues to have some weakness and malaise.  She states that she generally has poor appetite and denies any actual abdominal pain or nausea or vomiting with food.  She had a poor bowel movement after enema yesterday.  Objective: Vitals:   05/29/19 0521 05/29/19 1418 05/29/19 2049 05/30/19 0317  BP: 104/70 92/65 96/69  93/75  Pulse: 91 87 97 95  Resp: (!) 22 18 18 17   Temp: 98.6 F (37 C) 98.6 F (37 C) 99.1 F (37.3 C) 98.8 F (37.1 C)  TempSrc: Oral Oral Oral Oral  SpO2: 96% 97% 96% 97%  Weight:      Height:        Intake/Output Summary (Last 24 hours) at 05/30/2019 0836 Last data filed at 05/29/2019 2051 Gross per 24 hour  Intake 1923.68 ml  Output -  Net 1923.68 ml   Filed Weights   05/28/19 1753 05/28/19 2150  Weight: 90.8 kg 84.5 kg    Examination:  General exam: Appears calm and comfortable  Respiratory system: Clear to auscultation.  Respiratory effort normal. Cardiovascular system: S1 & S2 heard, RRR. No JVD, murmurs, rubs, gallops or clicks. No pedal edema. Gastrointestinal system: Abdomen is nondistended, soft and nontender. No organomegaly or masses felt. Normal bowel sounds heard. Central nervous system: Alert and oriented. No focal neurological deficits. Extremities: Symmetric 5 x 5 power. Skin: No rashes, lesions or ulcers Psychiatry: Depressed mood and affect.    Data Reviewed: I have personally reviewed following labs and imaging studies  CBC: Recent Labs  Lab 05/28/19 1743 05/30/19 0321  WBC 4.9 5.7  NEUTROABS 2.3  --   HGB 8.7* 8.4*  HCT 26.9* 25.4*  MCV 97.5 93.7  PLT 271 349   Basic Metabolic Panel: Recent Labs  Lab 05/28/19 1846 05/29/19 0015 05/30/19 0321  NA 133* 136 134*  K 5.8* 3.9 4.0  CL 99 103 101  CO2 25 24 24   GLUCOSE 89 92 81  BUN 24* 22 21  CREATININE 2.26* 2.02* 1.75*  CALCIUM 8.3* 8.2* 8.1*  MG 1.5*  --  1.8  PHOS 3.5  --   --    GFR: Estimated Creatinine Clearance: 34.6 mL/min (A) (by C-G formula based on SCr of 1.75 mg/dL (H)). Liver Function Tests: Recent Labs  Lab 05/28/19 1846  AST 37  ALT 12  ALKPHOS 89  BILITOT 1.2  PROT 6.2*  ALBUMIN 2.4*   No results for input(s): LIPASE, AMYLASE in the  last 168 hours. Recent Labs  Lab 05/28/19 2025  AMMONIA 18   Coagulation Profile: No results for input(s): INR, PROTIME in the last 168 hours. Cardiac Enzymes: No results for input(s): CKTOTAL, CKMB, CKMBINDEX, TROPONINI in the last 168 hours. BNP (last 3 results) No results for input(s): PROBNP in the last 8760 hours. HbA1C: No results for input(s): HGBA1C in the last 72 hours. CBG: Recent Labs  Lab 05/28/19 1647  GLUCAP 89   Lipid Profile: No results for input(s): CHOL, HDL, LDLCALC, TRIG, CHOLHDL, LDLDIRECT in the last 72 hours. Thyroid Function Tests: Recent Labs    05/28/19 2025  TSH 1.137   Anemia Panel: Recent Labs    05/29/19 1537  05/29/19 1831  VITAMINB12 1,209*  --   FOLATE 21.2  --   FERRITIN 990*  --   TIBC 199*  --   IRON 29  --   RETICCTPCT  --  1.7   Sepsis Labs: Recent Labs  Lab 05/28/19 2025 05/29/19 0015  LATICACIDVEN 0.9 1.0    Recent Results (from the past 240 hour(s))  SARS CORONAVIRUS 2 Nasal Swab Aptima Multi Swab     Status: None   Collection Time: 05/28/19  8:58 PM   Specimen: Aptima Multi Swab; Nasal Swab  Result Value Ref Range Status   SARS Coronavirus 2 NEGATIVE NEGATIVE Final    Comment: (NOTE) SARS-CoV-2 target nucleic acids are NOT DETECTED. The SARS-CoV-2 RNA is generally detectable in upper and lower respiratory specimens during the acute phase of infection. Negative results do not preclude SARS-CoV-2 infection, do not rule out co-infections with other pathogens, and should not be used as the sole basis for treatment or other patient management decisions. Negative results must be combined with clinical observations, patient history, and epidemiological information. The expected result is Negative. Fact Sheet for Patients: HairSlick.no Fact Sheet for Healthcare Providers: quierodirigir.com This test is not yet approved or cleared by the [place] FDA and  has been authorized for detection and/or diagnosis of SARS-CoV-2 by FDA under an Emergency Use Authorization (EUA). This EUA will remain  in effect (meaning this test can be used) for the duration of the COVID-19 declaration under Section 56 4(b)(1) of the Act, 21 U.S.C. section 360bbb-3(b)(1), unless the authorization is terminated or revoked sooner. Performed at [facility], [address], [place], [place] [address]          Radiology Studies: Dg Tibia/fibula Left  Result Date: 05/28/2019 CLINICAL DATA:  All over lower left leg pain. Pt states it has been painful for months. States she was just in the hospital for colitis. No hx of injury to the  lower left leg. EXAM: LEFT TIBIA AND FIBULA - 2 VIEW COMPARISON:  None. FINDINGS: No fracture or bone lesion. Knee and ankle joints are normally spaced and aligned. Soft tissues are unremarkable. IMPRESSION: Negative. Electronically Signed   By: Amie Portland M.D.   On: 05/28/2019 18:07   Dg Abd 1 View  Result Date: 05/29/2019 CLINICAL DATA:  Abdominal pain with improvement. EXAM: ABDOMEN - 1 VIEW COMPARISON:  CT abdomen pelvis 03/25/2019 FINDINGS: Relative paucity of small bowel gas. Large volume stool throughout the colon. Pelvic phleboliths. Lumbar spine degenerative changes. IMPRESSION: Large volume stool throughout the colon. Paucity of small bowel gas. Electronically Signed   By: Annia Belt M.D.   On: 05/29/2019 11:28        Scheduled Meds: . aspirin EC  81 mg Oral Daily  . cloNIDine  0.3 mg  Oral Daily  . clopidogrel  75 mg Oral Daily  . colestipol  1 g Oral BID  . docusate sodium  100 mg Oral BID  . DULoxetine  60 mg Oral Daily  . feeding supplement (ENSURE ENLIVE)  237 mL Oral BID BM  . hydroxychloroquine  200 mg Oral BID  . megestrol  40 mg Oral BID  . pantoprazole  40 mg Oral BID  . polyethylene glycol  17 g Oral Daily   Continuous Infusions: . sodium chloride       LOS: 2 days    Time spent: 30 minutes     Darleen Crocker, DO [facility] Pager [phone]  If 7PM-7AM, please contact night-coverage [email or web address] Password [facility] 05/30/2019, 8:36 AM

## 2019-05-30 NOTE — Progress Notes (Signed)
Initial Nutrition Assessment   RD working remotely.  DOCUMENTATION CODES:   Obesity unspecified  INTERVENTION:  Provide Ensure Enlive po TID, each supplement provides 350 kcal and 20 grams of protein  Provide multivitamin once daily.   NUTRITION DIAGNOSIS:   Inadequate oral intake related to decreased appetite as evidenced by per patient/family report.  GOAL:   Patient will meet greater than or equal to 90% of their needs  MONITOR:   PO intake, Supplement acceptance, Skin, Weight trends, Labs, I & O's  REASON FOR ASSESSMENT:   Consult Assessment of nutrition requirement/status  ASSESSMENT:   68 y.o. female with medical history significant of CVA with left-sided weakness, CMV colitis, anemia, hypertension and rheumatoid arthritis. Presents with n/v and increasing weakness. Patient was admitted with AKI secondary to poor oral intake with recurrent nausea and vomiting with eating  RD contacted pt via inpatient room phone. Pt reports 0% meal completion today. Pt reports having a decreased appetite which has been ongoing over the past 2 weeks. Pt reports only able to consume 1 meal a day, however still able to consume at least 3 Ensure shakes a day. Per weight records, pt with a 8.5% weight loss in 2 months, which is significant for time frame. RD to order Ensure TID to aid in caloric and protein needs. Will additionally order MVI as well. Pt encouraged to eat her food at meals and to drink her supplements.   Unable to complete Nutrition-Focused physical exam at this time.   Labs and medications reviewed.   Diet Order:   Diet Order            Diet Heart Room service appropriate? Yes; Fluid consistency: Thin  Diet effective now              EDUCATION NEEDS:   Not appropriate for education at this time  Skin:  Skin Assessment: Reviewed RN Assessment  Last BM:  8/2  Height:   Ht Readings from Last 1 Encounters:  05/28/19 5\' 6"  (1.676 m)    Weight:   Wt  Readings from Last 1 Encounters:  05/28/19 84.5 kg    Ideal Body Weight:  59 kg  BMI:  Body mass index is 30.07 kg/m.  Estimated Nutritional Needs:   Kcal:  1700-1850  Protein:  85-95 grams  Fluid:  >/= 1.7 L/day    Corrin Parker, MS, RD, LDN Pager # [phone] After hours/ weekend pager # [phone]

## 2019-05-30 NOTE — NC FL2 (Signed)
[place] MEDICAID FL2 LEVEL OF CARE SCREENING TOOL     IDENTIFICATION  Patient Name: Michelle Graves Birthdate: Feb 11, 1951 Sex: female Admission Date (Current Location): 05/28/2019  [facility] and [place] Number:  Herbalist and Address:  [facility], [place] [address] [place], [place] [address]      Provider Number: [id number]  Attending Physician Name and Address:  Rodena Goldmann, DO  Relative Name and Phone Number:  Inocencio Homes, Daughter, [phone]    Current Level of Care: Hospital Recommended Level of Care: [place] Prior Approval Number:    Date Approved/Denied:   PASRR Number: [id number]  Discharge Plan: SNF    Current Diagnoses: Patient Active Problem List   Diagnosis Date Noted  . AKI (acute kidney injury) ([place]) 05/28/2019  . Nausea & vomiting 05/28/2019  . 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 Anemia, chronic disease 05/28/2019  . Swelling   . Small vessel disease, cerebrovascular   . Acute ischemic stroke ([place])   . General weakness   . Acute left ankle pain   . Acute on chronic renal failure ([place]) 05/13/2019  . Weakness of both lower extremities 05/13/2019  . Cerebral thrombosis with cerebral infarction 03/31/2019  . New onset a-fib ([place]) 03/27/2019  . Acute kidney injury superimposed on CKD ([place]) 03/24/2019  . Hypotension 03/24/2019  . Dark emesis 03/24/2019  . Dark stools 03/24/2019  . Diverticulitis of intestine without perforation or abscess without bleeding   . Sinus tachycardia 08/27/2016  . Diverticulitis 08/26/2016  . HYPERTENSION, MALIGNANT ESSENTIAL 06/24/2007  . KIDNEY DISEASE, CHRONIC, STAGE III 06/24/2007  . ARTHRITIS, RHEUMATOID, SEROPOSITIVE 06/24/2007    Orientation RESPIRATION BLADDER Height & Weight     Self, Time, Situation, Place  Normal Continent, External catheter Weight: 186 lb 4.6 oz (84.5 kg) Height:  5\' 6"  (167.6 cm)  BEHAVIORAL SYMPTOMS/MOOD NEUROLOGICAL BOWEL NUTRITION  STATUS      Continent Diet(Heart Healthy Diet, thin liquids)  AMBULATORY STATUS COMMUNICATION OF NEEDS Skin   Limited Assist Verbally Normal                       Personal Care Assistance Level of Assistance  Bathing, Feeding, Total care, Dressing Bathing Assistance: Limited assistance Feeding assistance: Independent Dressing Assistance: Limited assistance Total Care Assistance: Limited assistance   Functional Limitations Info  Sight, Hearing, Speech Sight Info: Adequate Hearing Info: Adequate Speech Info: Adequate    SPECIAL CARE FACTORS FREQUENCY  PT (By licensed PT), OT (By licensed OT)     PT Frequency: 5x/wk OT Frequency: 5x/wk            Contractures Contractures Info: Not present    Additional Factors Info  Code Status, Allergies, Psychotropic Code Status Info: Partial Code Allergies Info: No Known Allergies Psychotropic Info: Cymbalta DR 60mg  daily         Current Medications (05/30/2019):  This is the current hospital active medication list Current Facility-Administered Medications  Medication Dose Route Frequency Provider Last Rate Last Dose  . 0.9 %  sodium chloride infusion   Intravenous Continuous Heath Lark D, DO 75 mL/hr at 05/30/19 1129    . acetaminophen (TYLENOL) tablet 650 mg  650 mg Oral Q6H PRN Norins, Heinz Knuckles, MD       Or  . acetaminophen (TYLENOL) suppository 650 mg  650 mg Rectal Q6H PRN Norins, Heinz Knuckles, MD      . aspirin EC tablet 81 mg  81 mg Oral  Daily Norins, Rosalyn Gess, MD   81 mg at 05/30/19 0836  . cloNIDine (CATAPRES) tablet 0.3 mg  0.3 mg Oral Daily Norins, Rosalyn Gess, MD   0.3 mg at 05/30/19 0835  . clopidogrel (PLAVIX) tablet 75 mg  75 mg Oral Daily Norins, Rosalyn Gess, MD   75 mg at 05/30/19 0836  . colestipol (COLESTID) tablet 1 g  1 g Oral BID Norins, Rosalyn Gess, MD   1 g at 05/30/19 0836  . docusate sodium (COLACE) capsule 100 mg  100 mg Oral BID Jacques Navy, MD   100 mg at 05/30/19 0836  . DULoxetine (CYMBALTA) DR  capsule 60 mg  60 mg Oral Daily Norins, Rosalyn Gess, MD   60 mg at 05/30/19 0836  . feeding supplement (ENSURE ENLIVE) (ENSURE ENLIVE) liquid 237 mL  237 mL Oral BID BM Shah, Pratik D, DO   Stopped at 05/30/19 1326  . hydroxychloroquine (PLAQUENIL) tablet 200 mg  200 mg Oral BID Jacques Navy, MD   200 mg at 05/30/19 0835  . megestrol (MEGACE) 400 MG/10ML suspension 400 mg  400 mg Oral BID Sherryll Burger, Pratik D, DO   400 mg at 05/30/19 1130  . ondansetron (ZOFRAN) tablet 4 mg  4 mg Oral Q6H PRN Norins, Rosalyn Gess, MD      . pantoprazole (PROTONIX) EC tablet 40 mg  40 mg Oral BID Sherryll Burger, Pratik D, DO   40 mg at 05/30/19 0836  . polyethylene glycol (MIRALAX / GLYCOLAX) packet 17 g  17 g Oral Daily Sherryll Burger, Pratik D, DO   17 g at 05/30/19 1130  . traMADol (ULTRAM) tablet 50 mg  50 mg Oral Q12H PRN Norins, Rosalyn Gess, MD         Discharge Medications: Please see discharge summary for a list of discharge medications.  Relevant Imaging Results:  Relevant Lab Results:   Additional Information SSN: [id number]  Nada Boozer Samhita Kretsch, LCSWA

## 2019-05-30 NOTE — TOC Initial Note (Signed)
Transition of Care [facility]) - Initial/Assessment Note    Patient Details  Name: Michelle Graves MRN: [id number] Date of Birth: August 18, 1951  [facility] CM/SW Contact:    Gelene Mink, [place] Phone Number: 05/30/2019, 2:10 PM  Clinical Narrative:                  CSW went and met with the patient at bedside. The patient was alert and oriented. CSW introduced herself and explained her role. The patient was willing to speak with the CSW. CSW shared the therapists SNF recommendation. The patient is agreeable to going to rehab. She stated she has been to a facility in the past. CSW asked her what facility, she stated that she could not remember at this moment in time. CSW provided the [facility] SNF list and offered choice. The patient did not have a preference, she gave the CSW permission to do a general fax out and provide offers. CSW stated she would come back with bed offers and thanked the patient for her time.   CSW received previous hospital notes. The patient was discharged to [facility] on April 04, 2019 for SNF. CSW will continue to follow.    Expected Discharge Plan: Skilled Nursing Facility Barriers to Discharge: Insurance Authorization, Continued Medical Work up   Patient Goals and CMS Choice Patient states their goals for this hospitalization and ongoing recovery are:: Pt is agreeable to rehab. CMS Medicare.gov Compare Post Acute Care list provided to:: Patient Choice offered to / list presented to : Patient  Expected Discharge Plan and Services Expected Discharge Plan: [place] In-house Referral: Clinical Social Work Discharge Planning Services: NA Post Acute Care Choice: [place] Living arrangements for the past 2 months: Apartment                 DME Arranged: N/A DME Agency: NA       HH Arranged: NA [place] Agency: NA        Prior Living Arrangements/Services Living arrangements for the past 2 months: Apartment Lives with::  Adult Children Patient language and need for interpreter reviewed:: Yes Do you feel safe going back to the place where you live?: Yes      Need for Family Participation in Patient Care: No (Comment) Care giver support system in place?: No (comment)   Criminal Activity/Legal Involvement Pertinent to Current Situation/Hospitalization: No - Comment as needed  Activities of Daily Living      Permission Sought/Granted Permission sought to share information with : Case Manager Permission granted to share information with : Yes, Verbal Permission Granted  Share Information with NAME: [name]  Permission granted to share info w AGENCY: All SNF  Permission granted to share info w Relationship: Daughter     Emotional Assessment Appearance:: Appears stated age Attitude/Demeanor/Rapport: Engaged Affect (typically observed): Calm Orientation: : Oriented to Self, Oriented to Place, Oriented to  Time, Oriented to Situation Alcohol / Substance Use: Not Applicable Psych Involvement: No (comment)  Admission diagnosis:  AKI (acute kidney injury) ([place]) [N17.9] Hypotension, unspecified hypotension type [I95.9] Patient Active Problem List   Diagnosis Date Noted  . AKI (acute kidney injury) ([place]) 05/28/2019  . Nausea & vomiting 05/28/2019  . 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 Anemia, chronic disease 05/28/2019  . Swelling   . Small vessel disease, cerebrovascular   . Acute ischemic stroke ([place])   . General weakness   . Acute left ankle pain   . Acute on chronic renal failure (  [place]) 05/13/2019  . Weakness of both lower extremities 05/13/2019  . Cerebral thrombosis with cerebral infarction 03/31/2019  . New onset a-fib ([place]) 03/27/2019  . Acute kidney injury superimposed on CKD ([place]) 03/24/2019  . Hypotension 03/24/2019  . Dark emesis 03/24/2019  . Dark stools 03/24/2019  . Diverticulitis of intestine without perforation or abscess without bleeding   . Sinus tachycardia 08/27/2016  .  Diverticulitis 08/26/2016  . HYPERTENSION, MALIGNANT ESSENTIAL 06/24/2007  . KIDNEY DISEASE, CHRONIC, STAGE III 06/24/2007  . ARTHRITIS, RHEUMATOID, SEROPOSITIVE 06/24/2007   PCP:  [facility]:   [facility] [place], [place] [address] Phone: [phone] Fax: [phone]     Social Determinants of Health (SDOH) Interventions    Readmission Risk Interventions No flowsheet data found.

## 2019-05-31 ENCOUNTER — Encounter (HOSPITAL_COMMUNITY): Payer: Self-pay

## 2019-05-31 LAB — BASIC METABOLIC PANEL
Anion gap: 8 (ref 5–15)
BUN: 20 mg/dL (ref 8–23)
CO2: 23 mmol/L (ref 22–32)
Calcium: 8 mg/dL — ABNORMAL LOW (ref 8.9–10.3)
Chloride: 104 mmol/L (ref 98–111)
Creatinine, Ser: 1.61 mg/dL — ABNORMAL HIGH (ref 0.44–1.00)
GFR calc Af Amer: 38 mL/min — ABNORMAL LOW (ref >60)
GFR calc non Af Amer: 33 mL/min — ABNORMAL LOW (ref >60)
Glucose, Bld: 82 mg/dL (ref 70–99)
Potassium: 4.2 mmol/L (ref 3.5–5.1)
Sodium: 135 mmol/L (ref 135–145)

## 2019-05-31 LAB — CBC
HCT: 25.7 % — ABNORMAL LOW (ref 36.0–46.0)
Hemoglobin: 8.4 g/dL — ABNORMAL LOW (ref 12.0–15.0)
MCH: 30.9 pg (ref 26.0–34.0)
MCHC: 32.7 g/dL (ref 30.0–36.0)
MCV: 94.5 fL (ref 80.0–100.0)
Platelets: 339 10*3/uL (ref 150–400)
RBC: 2.72 MIL/uL — ABNORMAL LOW (ref 3.87–5.11)
RDW: 17.8 % — ABNORMAL HIGH (ref 11.5–15.5)
WBC: 5.6 10*3/uL (ref 4.0–10.5)
nRBC: 0 % (ref 0.0–0.2)

## 2019-05-31 LAB — CORTISOL-AM, BLOOD: Cortisol - AM: 4.5 ug/dL — ABNORMAL LOW (ref 6.7–22.6)

## 2019-05-31 MED ORDER — TRAZODONE HCL 100 MG PO TABS
100.0000 mg | ORAL_TABLET | Freq: Every evening | ORAL | Status: DC | PRN
  Administered 2019-05-31: 100 mg via ORAL
  Filled 2019-05-31: qty 1

## 2019-05-31 MED ORDER — WHITE PETROLATUM EX OINT
TOPICAL_OINTMENT | CUTANEOUS | Status: DC | PRN
  Filled 2019-05-31 (×2): qty 28.35

## 2019-05-31 MED ORDER — DEXAMETHASONE SODIUM PHOSPHATE 4 MG/ML IJ SOLN
4.0000 mg | Freq: Two times a day (BID) | INTRAMUSCULAR | Status: DC
  Administered 2019-05-31 – 2019-06-01 (×3): 4 mg via INTRAVENOUS
  Filled 2019-05-31 (×3): qty 1

## 2019-05-31 MED ORDER — COSYNTROPIN 0.25 MG IJ SOLR
0.2500 mg | Freq: Once | INTRAMUSCULAR | Status: AC
  Administered 2019-06-01: 0.25 mg via INTRAVENOUS
  Filled 2019-05-31 (×2): qty 0.25

## 2019-05-31 NOTE — TOC Progression Note (Signed)
Transition of Care [facility]) - Progression Note    Patient Details  Name: DENIAH SAIA MRN: [id number] Date of Birth: [birth year]  Transition of Care [facility] CM/SW [place], [place] Phone Number: 05/31/2019, 10:46 AM  Clinical Narrative:    CSW met with pt at bedside. Provided pt with her offers and introduced self, role, reason for visit. Pt remembers Urban Gibson, [place] meeting with her yesterday and pt is aware she is recommended for rehab. She confirmed that she lives alone but has been staying with her daughter. Pt appears tired and states she does not feel like she can make a SNF decision today. CSW received permission to contact pt daughter [name].   CSW called [name] at [phone]; [name] confirmed that pt has been staying with her but as she works for [employer] she will be going back to work and worries about pt staying alone during the work day. Pt daughter is amenable to pt offers being emailed to her at [email or web address] and will stop by to pick up the packet of highlighted offers this afternoon.   CSW will send email and requested pt daughter provide at least a top 2 choices as soon as possible.    Expected Discharge Plan: Skilled Nursing Facility Barriers to Discharge: Ship broker, Continued Medical Work up  Expected Discharge Plan and Services Expected Discharge Plan: [place] In-house Referral: Clinical Social Work Discharge Planning Services: NA Post Acute Care Choice: [place] Living arrangements for the past 2 months: Apartment                 DME Arranged: N/A DME Agency: NA HH Arranged: NA HH Agency: NA   Social Determinants of Health (SDOH) Interventions    Readmission Risk Interventions No flowsheet data found.

## 2019-05-31 NOTE — Progress Notes (Addendum)
PROGRESS NOTE    Michelle MallingBarbara J Graves  WUJ:[id number] DOB: 02/06/1951 DOA: 05/28/2019 [facility]: [facility]   Brief Narrative:  Per HPI: Michelle LullBarbara J Elliottis a 68 y.o.femalewith medical history significant ofCVA with left-sided weakness, CMV colitis, anemia, hypertension and rheumatoid arthritis. Patient has had multiple hospitalizations over the last several months for CMV colitis. She also has been evaluated for increasing weakness in her legs. During [facility] from July 17 to May 16, 2019 she had a complete work-up and neurology consultation.Serologiesincluded CK, Aldotase, LDH, anti-Jo, anti-Ro, anti-La, and anti-Smith antibodies. She had anti-VG CC antibodies tested. She had MRI of the thoracic and lumbar spine which did reveal spinal stenosis in the lumbar region but not to a degree that would cause lower extremity weakness. Final diagnosis was deconditioning exacerbated by left ankle sprain. Patient did return home for short time but then due toincreasing weakness and inability to manage her ADLs he returned to her daughter's home. For the past several days the patient's had increasing weakness generally. She has had increasing nausea and multiple episodes of vomiting day prior to admission. She admits to poor p.o. intake of both food and fluids. Because of her symptoms and weakness she was brought by ambulance to [facility].She denies any acute pain or any focal neurologic symptoms. She has completed a full course of ganciclovir for CMV colitis.  Patient was admitted with AKI secondary to poor oral intake with recurrent nausea and vomiting with eating. She was recently treated for CMV colitis and was scheduled to see her gastroenterologist on 8/12. She was previously noted to have linear gastric ulcerations on endoscopy on 03/2019 along with colonoscopy at that time with ulcerations throughout her  colon concerning for ischemic colitis. She appears to be having some improvement in her creatinine levels with prior baseline between 1.2-1.7.  8/4: Patient continues to have poor appetite.  Megace was initiated 8/3 and likely will take a few days to take full effect.  Calorie count initiated today.  A.m. cortisol noted to be low and will pursue further with ACTH stimulation test.  Likely may have a component of adrenal insufficiency with initial mild hyponatremia and soft blood pressure readings noted.  Will start on IV dexamethasone for now.  PT recommending SNF on discharge which is appropriate.   Assessment & Plan:   Active Problems:   Weakness of both lower extremities   Small vessel disease, cerebrovascular   Acute left ankle pain   AKI (acute kidney injury) (HCC)   Nausea & vomiting   CMV colitis (HCC)   Anemia, chronic disease   AKI-likely prerenal due to poor oral intake (baseline 1.2-1.7)-improving -Continue on maintenance IV fluid in the face of poor appetite -Monitor intake and output -Avoid nephrotoxic agents -Recheck renal panel in a.m.  Intermittent abdominal pain with nausea and vomiting- resolving, now with just poor appetite -Patient has had recent upper and lower endoscopy on 03/2019 with linear gastric erosions as well as findings of ischemic colitis -I do not currently suspect ischemic bowel or other acute pathology -KUB demonstrates large stool burden and enema given on 8/2 with minimal bowel movement -Continue MiraLAX daily for now and repeat enema as needed -Zofran as needed -Nutritional supplementation with Ensure -Appetite stimulation with Megace and advance diet -Dietitian consultation ordered along with calorie count starting today 8/4  Possible adrenal insufficiency -Low a.m. cortisol and will investigate further with ACTH stimulation in a.m. -Start dexamethasone IV  twice daily for now, which will not interact with the test -Reassess blood pressure  and fatigue in a.m. -She was also noted to be mildly hyponatremic on admission  Prior CMV colitis status post treatment with ganciclovir -Currently without acute symptomatology related to this -Continue to monitor closely and will need GI follow-up outpatient, does not appear to require any inpatient evaluation  Persistent lower extremity weakness-identified in setting of prior CVA -Appreciate OT evaluation and PT evaluation with recommendations for SNF -Continue aspirin and Plavix treatment  Chronic anemia-stable;likely iron deficiency based on prior anemia panel -Continue on PPI twice daily for now -Stable anemia panel currently -Recheck CBC in a.m.  Hypertension-soft blood pressure readings -Hold home amlodipine and metoprolol for now -Hold clonidine for now as well given soft blood pressure readings  Protein calorie malnutrition -Nutrition/dietitian consult -Started on Ensure -Calorie count -May require feeding tube if patient cannot meet nutrition/caloric needs and patient is agreeable to this if needed   DVT prophylaxis:Currently on aspirin and Plavix also SCDs Code Status:Partial-no intubation Family Communication:Discussed over phone with daughter on 8/2; tried calling 8/4 with no answer Disposition Plan:Started on Megace 8/3 to help improve appetite with minimal improvement.  Continue to monitor with calorie counts ordered today.  May require feeding tube in the near future to help assist with nutritional supplementation.  Cosyntropin stimulation test ordered for a.m. based on low cortisol levels and started on dexamethasone IV twice daily for now.  PT recommending SNF on discharge and patient likely will not be ready for at least another 48-72 hours.   Consultants:  None  Procedures:  None  Antimicrobials:   None  Subjective: Patient seen and evaluated today with ongoing lethargy and fatigue as well as poor appetite.  She has not had any  increase in appetite as of yet with the Megace.  She continues to have some low blood pressure readings as well as low cortisol noted this morning.  Objective: Vitals:   05/30/19 1407 05/30/19 2029 05/30/19 2205 05/31/19 0403  BP: (!) 90/59 98/62  91/63  Pulse: 87 (!) 105 90 100  Resp: 18 17  18   Temp: 98.7 F (37.1 C) 99 F (37.2 C)  99 F (37.2 C)  TempSrc: Oral Oral  Oral  SpO2: 99% 95%  97%  Weight:      Height:        Intake/Output Summary (Last 24 hours) at 05/31/2019 1329 Last data filed at 05/31/2019 1107 Gross per 24 hour  Intake 1656.17 ml  Output 600 ml  Net 1056.17 ml   Filed Weights   05/28/19 1753 05/28/19 2150  Weight: 90.8 kg 84.5 kg    Examination:  General exam: Appears calm and comfortable  Respiratory system: Clear to auscultation. Respiratory effort normal. Cardiovascular system: S1 & S2 heard, RRR. No JVD, murmurs, rubs, gallops or clicks. No pedal edema. Gastrointestinal system: Abdomen is nondistended, soft and nontender. No organomegaly or masses felt. Normal bowel sounds heard. Central nervous system: Alert and oriented. No focal neurological deficits. Extremities: Symmetric 5 x 5 power. Skin: No rashes, lesions or ulcers Psychiatry: Depressed mood and affect.    Data Reviewed: I have personally reviewed following labs and imaging studies  CBC: Recent Labs  Lab 05/28/19 1743 05/30/19 0321 05/31/19 0326  WBC 4.9 5.7 5.6  NEUTROABS 2.3  --   --   HGB 8.7* 8.4* 8.4*  HCT 26.9* 25.4* 25.7*  MCV 97.5 93.7 94.5  PLT 271 349 235   Basic Metabolic Panel:  Recent Labs  Lab 05/28/19 1846 05/29/19 0015 05/30/19 0321 05/31/19 0326  NA 133* 136 134* 135  K 5.8* 3.9 4.0 4.2  CL 99 103 101 104  CO2 25 24 24 23   GLUCOSE 89 92 81 82  BUN 24* 22 21 20   CREATININE 2.26* 2.02* 1.75* 1.61*  CALCIUM 8.3* 8.2* 8.1* 8.0*  MG 1.5*  --  1.8  --   PHOS 3.5  --   --   --    GFR: Estimated Creatinine Clearance: 37.7 mL/min (A) (by C-G formula based  on SCr of 1.61 mg/dL (H)). Liver Function Tests: Recent Labs  Lab 05/28/19 1846  AST 37  ALT 12  ALKPHOS 89  BILITOT 1.2  PROT 6.2*  ALBUMIN 2.4*   No results for input(s): LIPASE, AMYLASE in the last 168 hours. Recent Labs  Lab 05/28/19 2025  AMMONIA 18   Coagulation Profile: No results for input(s): INR, PROTIME in the last 168 hours. Cardiac Enzymes: No results for input(s): CKTOTAL, CKMB, CKMBINDEX, TROPONINI in the last 168 hours. BNP (last 3 results) No results for input(s): PROBNP in the last 8760 hours. HbA1C: No results for input(s): HGBA1C in the last 72 hours. CBG: Recent Labs  Lab 05/28/19 1647  GLUCAP 89   Lipid Profile: No results for input(s): CHOL, HDL, LDLCALC, TRIG, CHOLHDL, LDLDIRECT in the last 72 hours. Thyroid Function Tests: Recent Labs    05/28/19 2025  TSH 1.137   Anemia Panel: Recent Labs    05/29/19 1537 05/29/19 1831  VITAMINB12 1,209*  --   FOLATE 21.2  --   FERRITIN 990*  --   TIBC 199*  --   IRON 29  --   RETICCTPCT  --  1.7   Sepsis Labs: Recent Labs  Lab 05/28/19 2025 05/29/19 0015  LATICACIDVEN 0.9 1.0    Recent Results (from the past 240 hour(s))  SARS CORONAVIRUS 2 Nasal Swab Aptima Multi Swab     Status: None   Collection Time: 05/28/19  8:58 PM   Specimen: Aptima Multi Swab; Nasal Swab  Result Value Ref Range Status   SARS Coronavirus 2 NEGATIVE NEGATIVE Final    Comment: (NOTE) SARS-CoV-2 target nucleic acids are NOT DETECTED. The SARS-CoV-2 RNA is generally detectable in upper and lower respiratory specimens during the acute phase of infection. Negative results do not preclude SARS-CoV-2 infection, do not rule out co-infections with other pathogens, and should not be used as the sole basis for treatment or other patient management decisions. Negative results must be combined with clinical observations, patient history, and epidemiological information. The expected result is Negative. Fact Sheet for  Patients: 07/29/19 Fact Sheet for Healthcare Providers: 07/28/19 This test is not yet approved or cleared by the [place] FDA and  has been authorized for detection and/or diagnosis of SARS-CoV-2 by FDA under an Emergency Use Authorization (EUA). This EUA will remain  in effect (meaning this test can be used) for the duration of the COVID-19 declaration under Section 56 4(b)(1) of the Act, 21 U.S.C. section 360bbb-3(b)(1), unless the authorization is terminated or revoked sooner. Performed at [facility], [address] [place], [address] [place]          Radiology Studies: No results found.      Scheduled Meds: . aspirin EC  81 mg Oral Daily  . clopidogrel  75 mg Oral Daily  . colestipol  1 g Oral BID  . [START ON 06/01/2019] cosyntropin  0.25 mg Intravenous Once  .  dexamethasone (DECADRON) injection  4 mg Intravenous Q12H  . docusate sodium  100 mg Oral BID  . DULoxetine  60 mg Oral Daily  . feeding supplement (ENSURE ENLIVE)  237 mL Oral TID BM  . hydroxychloroquine  200 mg Oral BID  . megestrol  400 mg Oral BID  . multivitamin with minerals  1 tablet Oral Daily  . pantoprazole  40 mg Oral BID  . polyethylene glycol  17 g Oral Daily   Continuous Infusions: . sodium chloride 75 mL/hr at 05/31/19 0400     LOS: 3 days    Time spent: 30 minutes    Ramadan Couey Hoover Brunette, DO [facility] Pager [phone]  If 7PM-7AM, please contact night-coverage [email or web address] Password [email or web address] 05/31/2019, 1:29 PM

## 2019-05-31 NOTE — Progress Notes (Addendum)
Nutrition Follow-up  DOCUMENTATION CODES:   Obesity unspecified  INTERVENTION:   -Continue Ensure Enlive po TID, each supplement provides 350 kcal and 20 grams of protein -Continue MVI with minerals daily -Magic cup TID with meals, each supplement provides 290 kcal and 9 grams of protein -Initiate 48 hour calorie count per MD  NUTRITION DIAGNOSIS:   Inadequate oral intake related to decreased appetite as evidenced by per patient/family report.  Ongoing  GOAL:   Patient will meet greater than or equal to 90% of their needs  Progressing   MONITOR:   PO intake, Supplement acceptance, Skin, Weight trends, Labs, I & O's  REASON FOR ASSESSMENT:   Consult Calorie Count  ASSESSMENT:   68 y.o. female with medical history significant of CVA with left-sided weakness, CMV colitis, anemia, hypertension and rheumatoid arthritis. Presents with n/v and increasing weakness. Patient was admitted with AKI secondary to poor oral intake with recurrent nausea and vomiting with eating  8/3- Megace initiated  Reviewed I/O's: +306 ml x 24 hours  UOP: 900 ml x 24 hours  Case discussed with RN, who reports pt is very tired today and has been complaining of multiple staff members coming into her room today. Pt was sleeping soundly at time of visit; RD did not disturb. Noted pt consumed approximately 50% of cup of juice and a few sips of Ensure. Per RN, pt consumed some breakfast this AM and drank her Ensure earlier.   Total intake so far today: 952 kcals and 48 grams protein (two Ensure supplement, 55% of breakfast, and 20% of lunch ).   Per therapy notes, plan to d/c to SNF once medically stable.  Medications reviewed and include 0.9% sodium chloride @ 75 ml/hr, colace and miralax.   Labs reviewed.   NUTRITION - FOCUSED PHYSICAL EXAM:    Most Recent Value  Orbital Region  Mild depletion  Upper Arm Region  No depletion  Thoracic and Lumbar Region  No depletion  Buccal Region  No  depletion  Temple Region  Mild depletion  Clavicle Bone Region  No depletion  Clavicle and Acromion Bone Region  No depletion  Scapular Bone Region  No depletion  Dorsal Hand  No depletion  Patellar Region  No depletion  Anterior Thigh Region  No depletion  Posterior Calf Region  No depletion  Edema (RD Assessment)  Mild  Hair  Reviewed  Eyes  Reviewed  Mouth  Reviewed  Skin  Reviewed  Nails  Reviewed       Diet Order:   Diet Order            Diet Heart Room service appropriate? Yes; Fluid consistency: Thin  Diet effective now              EDUCATION NEEDS:   Not appropriate for education at this time  Skin:  Skin Assessment: Reviewed RN Assessment  Last BM:  05/31/19  Height:   Ht Readings from Last 1 Encounters:  05/28/19 5\' 6"  (1.676 m)    Weight:   Wt Readings from Last 1 Encounters:  05/28/19 84.5 kg    Ideal Body Weight:  59 kg  BMI:  Body mass index is 30.07 kg/m.  Estimated Nutritional Needs:   Kcal:  1700-1850  Protein:  85-95 grams  Fluid:  >/= 1.7 L/day    Cheyenne Bordeaux A. Jimmye Norman, RD, LDN, [place] Registered Dietitian II Certified Diabetes Care and Education Specialist Pager: [phone] After hours Pager: [phone]

## 2019-05-31 NOTE — Progress Notes (Signed)
Physical Therapy Treatment Patient Details Name: Michelle Graves MRN: [id number] DOB: May 15, 1951 Today's Date: 05/31/2019    History of Present Illness 68 y.o. female with medical history significant of CVA with left-sided weakness, CMV colitis, anemia, hypertension and rheumatoid arthritis.  Patient has had multiple hospitalizations over the last several months for CMV colitis.  She also has been evaluated for increasing weakness in her legs. July 17 to May 16, 2019 hopitalization she had a complete work-up and neurology consultation--final diagnosis was deconditioning exacerbated by left ankle sprain. due to increasing weakness and inability to manage her ADLs she returned to her daughter's home. SARS negative    PT Comments    Pt performed supine LE therapeutic exercises and transfer to edge of bed and OOB.  She required use of sara stedy with decreased assistance.  Pt requiring min to min guard assistance.  Plan for SNF remains appropriate as she is requiring assistance and still remains unable to ambulate due to L ankle pain.  Will plan for transition of transfers with RW next session as she tolerated use of stedy well.      Follow Up Recommendations  SNF     Equipment Recommendations  None recommended by PT    Recommendations for Other Services       Precautions / Restrictions Precautions Precautions: None Restrictions Weight Bearing Restrictions: No    Mobility  Bed Mobility Overal bed mobility: Needs Assistance Bed Mobility: Supine to Sit;Sit to Supine     Supine to sit: Min assist;HOB elevated     General bed mobility comments: Min assistance to move to edge of bed and moderate assistance to scoot to edge of bed to prepare for sit to stand transfer.  Transfers Overall transfer level: Needs assistance Equipment used: Ambulation equipment used(sara stedy) Transfers: Sit to/from Stand Sit to Stand: Min guard         General transfer comment: Min guard with sara  stedy.  Pt refused to stand on L ankle due to pain but able to achieve standing.  Ambulation/Gait Ambulation/Gait assistance: (NT)               Stairs             Wheelchair Mobility    Modified Rankin (Stroke Patients Only)       Balance Overall balance assessment: Needs assistance Sitting-balance support: Bilateral upper extremity supported Sitting balance-[name]: Poor       Standing balance-[name]: Poor Standing balance comment: min guard for assistance.                            Cognition Arousal/Alertness: Lethargic(still remains sleepy, but more awake as tx progressed.) Behavior During Therapy: Flat affect Overall Cognitive Status: Within Functional Limits for tasks assessed                                 General Comments: Following commands well for the most part slightly limited due to drowsiness.      Exercises General Exercises - Lower Extremity Ankle Circles/Pumps: AROM;20 reps;Both;Supine Quad Sets: AROM;Both;10 reps;Supine Heel Slides: Both;AAROM;10 reps;Supine    General Comments        Pertinent Vitals/Pain Pain Assessment: No/denies pain Pain Score: 5  Pain Location: pain in L ankle Pain Descriptors / Indicators: Constant;Sore Pain Intervention(s): Monitored during session;Repositioned    Home Living  Prior Function            PT Goals (current goals can now be found in the care plan section) Acute Rehab PT Goals Patient Stated Goal: to get stronger before going home PT Goal Formulation: With patient Potential to Achieve Goals: Fair Progress towards PT goals: Progressing toward goals    Frequency    Min 3X/week      PT Plan Current plan remains appropriate    Co-evaluation              AM-PAC PT "6 Clicks" Mobility   Outcome Measure  Help needed turning from your back to your side while in a flat bed without using bedrails?: A  Little Help needed moving from lying on your back to sitting on the side of a flat bed without using bedrails?: A Little Help needed moving to and from a bed to a chair (including a wheelchair)?: A Little Help needed standing up from a chair using your arms (e.g., wheelchair or bedside chair)?: A Little Help needed to walk in hospital room?: Total Help needed climbing 3-5 steps with a railing? : Total 6 Click Score: 14    End of Session Equipment Utilized During Treatment: Gait belt Activity Tolerance: Patient tolerated treatment well Patient left: in chair;with call bell/phone within reach;with chair alarm set Nurse Communication: Mobility status PT Visit Diagnosis: Hemiplegia and hemiparesis;Muscle weakness (generalized) (M62.81) Hemiplegia - Right/Left: Left Hemiplegia - dominant/non-dominant: Non-dominant Hemiplegia - caused by: Cerebral infarction Pain - Right/Left: Left Pain - part of body: Ankle and joints of foot     Time: 8850-2774 PT Time Calculation (min) (ACUTE ONLY): 18 min  Charges:  $Therapeutic Activity: 8-22 mins                     Governor Rooks, PTA Acute Rehabilitation Services Pager [phone] Office [phone]     Ambreen Tufte Eli Hose 05/31/2019, 4:38 PM

## 2019-06-01 DIAGNOSIS — M25572 Pain in left ankle and joints of left foot: Secondary | ICD-10-CM

## 2019-06-01 LAB — CBC
HCT: 26.2 % — ABNORMAL LOW (ref 36.0–46.0)
Hemoglobin: 8.6 g/dL — ABNORMAL LOW (ref 12.0–15.0)
MCH: 31 pg (ref 26.0–34.0)
MCHC: 32.8 g/dL (ref 30.0–36.0)
MCV: 94.6 fL (ref 80.0–100.0)
Platelets: 367 10*3/uL (ref 150–400)
RBC: 2.77 MIL/uL — ABNORMAL LOW (ref 3.87–5.11)
RDW: 17.3 % — ABNORMAL HIGH (ref 11.5–15.5)
WBC: 6.1 10*3/uL (ref 4.0–10.5)
nRBC: 0 % (ref 0.0–0.2)

## 2019-06-01 LAB — ENA+DNA/DS+ANTICH+CENTRO+JO...
Anti JO-1: <0.2 AI (ref 0.0–0.9)
Centromere Ab Screen: <0.2 AI (ref 0.0–0.9)
Chromatin Ab SerPl-aCnc: <0.2 AI (ref 0.0–0.9)
ENA SM Ab Ser-aCnc: <0.2 AI (ref 0.0–0.9)
Ribonucleic Protein: <0.2 AI (ref 0.0–0.9)
SSA (Ro) (ENA) Antibody, IgG: >8 AI — ABNORMAL HIGH (ref 0.0–0.9)
SSB (La) (ENA) Antibody, IgG: <0.2 AI (ref 0.0–0.9)
Scleroderma (Scl-70) (ENA) Antibody, IgG: <0.2 AI (ref 0.0–0.9)
ds DNA Ab: 11 IU/mL — ABNORMAL HIGH (ref 0–9)

## 2019-06-01 LAB — BASIC METABOLIC PANEL
Anion gap: 10 (ref 5–15)
BUN: 17 mg/dL (ref 8–23)
CO2: 19 mmol/L — ABNORMAL LOW (ref 22–32)
Calcium: 8.4 mg/dL — ABNORMAL LOW (ref 8.9–10.3)
Chloride: 106 mmol/L (ref 98–111)
Creatinine, Ser: 1.4 mg/dL — ABNORMAL HIGH (ref 0.44–1.00)
GFR calc Af Amer: 45 mL/min — ABNORMAL LOW (ref >60)
GFR calc non Af Amer: 39 mL/min — ABNORMAL LOW (ref >60)
Glucose, Bld: 106 mg/dL — ABNORMAL HIGH (ref 70–99)
Potassium: 4.2 mmol/L (ref 3.5–5.1)
Sodium: 135 mmol/L (ref 135–145)

## 2019-06-01 LAB — ACTH STIMULATION, 3 TIME POINTS
Cortisol, 30 Min: 13.6 ug/dL
Cortisol, 60 Min: 17 ug/dL
Cortisol, Base: 2.1 ug/dL

## 2019-06-01 LAB — ANA W/REFLEX IF POSITIVE: Anti Nuclear Antibody (ANA): POSITIVE — AB

## 2019-06-01 LAB — SARS CORONAVIRUS 2 (TAT 6-24 HRS): SARS Coronavirus 2: NEGATIVE

## 2019-06-01 MED ORDER — DEXAMETHASONE SODIUM PHOSPHATE 4 MG/ML IJ SOLN
4.0000 mg | Freq: Two times a day (BID) | INTRAMUSCULAR | Status: DC
  Administered 2019-06-01: 4 mg via INTRAVENOUS
  Filled 2019-06-01: qty 1

## 2019-06-01 NOTE — Progress Notes (Signed)
Occupational Therapy Treatment Patient Details Name: Michelle Graves MRN: [id number] DOB: [birth year] Today's Date: 06/01/2019    History of present illness 68 y.o. female with medical history significant of CVA with left-sided weakness, CMV colitis, anemia, hypertension and rheumatoid arthritis.  Patient has had multiple hospitalizations over the last several months for CMV colitis.  She also has been evaluated for increasing weakness in her legs. July 17 to May 16, 2019 hopitalization she had a complete work-up and neurology consultation--final diagnosis was deconditioning exacerbated by left ankle sprain. due to increasing weakness and inability to manage her ADLs she returned to her daughter's home. SARS negative   OT comments  Pt agreeable to SNF as she realizes she needs to be stronger  Follow Up Recommendations  SNF    Equipment Recommendations  3 in 1 bedside commode    Recommendations for Other Services PT consult    Precautions / Restrictions Precautions Precautions: Fall       Mobility Bed Mobility Overal bed mobility: Needs Assistance Bed Mobility: Supine to Sit     Supine to sit: Min assist;HOB elevated        Transfers Overall transfer level: Needs assistance Equipment used: Rolling walker (2 wheeled)(sara stedy) Transfers: Sit to/from [facility] Sit to Stand: Min assist Stand pivot transfers: Mod assist            Balance Overall balance assessment: Needs assistance Sitting-balance support: Bilateral upper extremity supported Sitting balance-[name]: Poor       Standing balance-[name]: Poor Standing balance comment: min guard for assistance.                           ADL either performed or assessed with clinical judgement   ADL Overall ADL's : Needs assistance/impaired     Grooming: Wash/dry face;Wash/dry hands;Sitting;Set up                   Toilet Transfer: Stand-pivot;Moderate  assistance;BSC;RW   Toileting- Clothing Manipulation and Hygiene: Sit to/from stand;Maximal assistance;Cueing for safety               Vision Patient Visual Report: No change from baseline            Cognition Arousal/Alertness: Awake/alert Behavior During Therapy: WFL for tasks assessed/performed;Flat affect Overall Cognitive Status: Within Functional Limits for tasks assessed                                                     Pertinent Vitals/ Pain       Pain Assessment: No/denies pain     Prior Functioning/Environment              Frequency  Min 2X/week        Progress Toward Goals  OT Goals(current goals can now be found in the care plan section)  Progress towards OT goals: Progressing toward goals     Plan Discharge plan remains appropriate       AM-PAC OT "6 Clicks" Daily Activity     Outcome Measure   Help from another person eating meals?: A Little Help from another person taking care of personal grooming?: A Little Help from another person toileting, which includes using toliet, bedpan, or urinal?: A Lot Help from another person bathing (including washing, rinsing, drying)?:  A Lot Help from another person to put on and taking off regular upper body clothing?: A Little Help from another person to put on and taking off regular lower body clothing?: A Lot 6 Click Score: 15    End of Session Equipment Utilized During Treatment: Gait belt;Rolling walker  OT Visit Diagnosis: Unsteadiness on feet (R26.81);Muscle weakness (generalized) (M62.81)   Activity Tolerance Patient tolerated treatment well;Patient limited by fatigue   Patient Left in chair;with call bell/phone within reach;with chair alarm set   Nurse Communication Mobility status        Time: 4854-6270 OT Time Calculation (min): 22 min  Charges: OT General Charges $OT Visit: 1 Visit OT Treatments $Self Care/Home Management : 8-22 mins  Lise Auer,  OT Acute Rehabilitation Services [phone] Office- [phone]      Danely Bayliss, Karin Golden D 06/01/2019, 5:12 PM

## 2019-06-01 NOTE — Progress Notes (Signed)
Calorie Count Note  RD working remotely.  48 hour calorie count ordered.  Diet: Heart Healthy Supplements: Ensure Enlive po TID, each supplement provides 350 kcal and 20 grams of protein; MVI daily; Magic cup TID with meals, each supplement provides 290 kcal and 9 grams of protein  No further data available to assess at this time. Pt consuming 20-55% of meals. Per discussion with RN yesterday, pt consuming Ensure supplements. Pt refused PM dose of Ensure last night.   05/31/19 Breakfast: 116 kcals, 3 grams protein Lunch: 109 kcals, 5 grams protein Dinner: no data available to assess Supplements: 2 Ensure supplements (700 kcals and 40 grams protein)  Total intake: 952 kcal (56% of minimum estimated needs)  48 grams protein (56% of minimum estimated needs)  Nutrition Dx: Inadequate oral intake related to decreased appetite as evidenced by per patient/family report; ongoing  Goal: Patient will meet greater than or equal to 90% of their needs; progressing   Intervention:   -Continue Ensure Enlive po TID, each supplement provides 350 kcal and 20 grams of protein -Continue MVI with minerals daily -Continue Magic cup TID with meals, each supplement provides 290 kcal and 9 grams of protein -Continue 48 hour calorie count   Ronald Vinsant A. Jimmye Norman, RD, LDN, [place] Registered Dietitian II Certified Diabetes Care and Education Specialist Pager: [phone] After hours Pager: [phone]

## 2019-06-01 NOTE — Care Management Important Message (Signed)
Important Message  Patient Details  Name: Michelle Graves MRN: [id number] Date of Birth: Nov 04, 1950   Medicare Important Message Given:  Yes     Memory Argue 06/01/2019, 3:49 PM

## 2019-06-01 NOTE — Progress Notes (Signed)
PROGRESS NOTE    Michelle MallingBarbara J Graves  ZOX:[id number] DOB: 07/28/1951 DOA: 05/28/2019 [facility]   Brief Narrative:  Per HPI: Michelle LullBarbara J Elliottis a 68 y.o.femalewith medical history significant ofCVA with left-sided weakness, CMV colitis, anemia, hypertension and rheumatoid arthritis. Patient has had multiple hospitalizations over the last several months for CMV colitis. She also has been evaluated for increasing weakness in her legs. [facility]  from July 17 to May 16, 2019 she had a complete work-up and neurology consultation.Serologiesincluded CK, Aldotase, LDH, anti-Jo, anti-Ro, anti-La, and anti-Smith antibodies. She had anti-VG CC antibodies tested. She had MRI of the thoracic and lumbar spine which did reveal spinal stenosis in the lumbar region but not to a degree that would cause lower extremity weakness. Final diagnosis was deconditioning exacerbated by left ankle sprain. Patient did return home for short time but then due toincreasing weakness and inability to manage her ADLs he returned to her daughter's home. For the past several days the patient's had increasing weakness generally. She has had increasing nausea and multiple episodes of vomiting day prior to admission. She admits to poor p.o. intake of both food and fluids. Because of her symptoms and weakness she was [facility] by [facility].She denies any acute pain or any focal neurologic symptoms. She has completed a full course of ganciclovir for CMV colitis.  Patient was admitted with AKI secondary to poor oral intake with recurrent nausea and vomiting with eating. She was recently treated for CMV colitis and was scheduled to see her gastroenterologist on 8/12. She was previously noted to have linear gastric ulcerations on endoscopy on 03/2019 along with colonoscopy at that time with ulcerations throughout her  colon concerning for ischemic colitis. She appears to be having some improvement in her creatinine levels with prior baseline between 1.2-1.7.  8/4: Patient continues to have poor appetite.  Megace was initiated 8/3 and likely will take a few days to take full effect.  Calorie count initiated today.  A.m. cortisol noted to be low and will pursue further with ACTH stimulation test.  Likely may have a component of adrenal insufficiency with initial mild hyponatremia and soft blood pressure readings noted.  Will start on IV dexamethasone for now.  PT recommending SNF on discharge which is appropriate.   Assessment & Plan:   Active Problems:   Weakness of both lower extremities   Small vessel disease, cerebrovascular   Acute left ankle pain   AKI (acute kidney injury) (HCC)   Nausea & vomiting   CMV colitis (HCC)   Anemia, chronic disease   AKI-likely prerenal due to poor oral intake (baseline 1.2-1.7)-improving -Responding well to IV fluids,  -Continue to encourage p.o. intake -Monitor intake and output -Avoid nephrotoxic agents -Recheck renal panel in a.m.  Intermittent abdominal pain with nausea and vomiting -Stable no complaint today.  - resolving, now with just poor appetite -Patient has had recent upper and lower endoscopy on 03/2019 with linear gastric erosions as well as findings of ischemic colitis -No signs or symptoms to suspect ischemic bowel or other acute pathology -KUB demonstrates large stool burden and enema given on 8/2 with minimal bowel movement -Continue MiraLAX daily for now and repeat enema as needed -Zofran as needed -Nutritional supplementation with Ensure -Appetite stimulation with Megace and advance diet -Dietitian consultation ordered along with calorie count starting today 8/4  Possible adrenal insufficiency -Low a.m. cortisol and will investigate further with  ACTH stimulation >>   -On  dexamethasone IV twice daily for now, which will not interact  with the test -Blood pressure remained stable -She was also noted to be mildly hyponatremic on admission  Prior CMV colitis status post treatment with ganciclovir -Currently without acute symptomatology related to this -Continue to monitor closely and will need GI follow-up outpatient, does not appear to require any inpatient evaluation  Persistent lower extremity weakness-identified in setting of prior CVA -Appreciate OT evaluation and PT evaluation with recommendations for SNF -Continue aspirin and Plavix treatment  Chronic anemia-stable;likely iron deficiency based on prior anemia panel -Continue on PPI twice daily for now -Stable anemia panel currently -Recheck CBC in a.m.  Hypertension-soft blood pressure readings -Blood pressure improving 117/86 -Continue to hold amlodipine, with Toprol for now, - clonidine DC'd  Protein calorie malnutrition -Nutrition/dietitian consult -Started on Ensure -Calorie count -May require feeding tube if patient cannot meet nutrition/caloric needs and patient is agreeable to this if needed   DVT prophylaxis:Currently on aspirin and Plavix also SCDs Code Status:Partial-no intubation Family Communication:Discussed over phone with daughter on 8/2; tried calling 8/4 with no answer Disposition Plan:Started on Megace 8/3 to help improve appetite with minimal improvement.  Continue to monitor with calorie counts ordered today.  May require feeding tube in the near future to help assist with nutritional supplementation.  Cosyntropin stimulation test ordered for a.m. based on low cortisol levels and started on dexamethasone IV twice daily for now.  PT recommending SNF on discharge and patient likely will not be ready for at least another 48-72 hours.   Consultants:  None  Procedures:  None  Antimicrobials:   None  Subjective:  The patient was seen and examined this morning, stable no acute distress, alert awake alert oriented  x3 reporting of poor appetite, Denies of having any headaches visual changes, denies any shortness of chest pain.  Still complains of generalized weakness unable to ambulate or carry her ADLs independently.   Spoken informed patient's daughter in detail and plan of care, and discharge.  She is agreeable.   Objective: Vitals:   05/31/19 1746 05/31/19 1806 05/31/19 2022 06/01/19 0451  BP:  116/79 102/72 117/86  Pulse: (!) 109 (!) 109 (!) 102 (!) 109  Resp:  17 17 18   Temp:  98.8 F (37.1 C) 98.7 F (37.1 C) 98.9 F (37.2 C)  TempSrc:  Oral Oral Oral  SpO2:  100% 100% 96%  Weight:      Height:        Intake/Output Summary (Last 24 hours) at 06/01/2019 1233 Last data filed at 06/01/2019 0815 Gross per 24 hour  Intake 590 ml  Output 400 ml  Net 190 ml   Filed Weights   05/28/19 1753 05/28/19 2150  Weight: 90.8 kg 84.5 kg   BP 117/86 (BP Location: Right Arm)   Pulse (!) 109   Temp 98.9 F (37.2 C) (Oral)   Resp 18   Ht 5\' 6"  (1.676 m)   Wt 84.5 kg   SpO2 96%   BMI 30.07 kg/m    Physical Exam  Constitution:  Alert, cooperative, no distress,  Psychiatric: Normal and stable mood and affect, cognition intact,   HEENT: Normocephalic, PERRL, otherwise with in Normal limits  Chest:Chest symmetric Cardio vascular:  S1/S2, RRR, No murmure, No Rubs or Gallops  pulmonary: Clear to auscultation bilaterally, respirations unlabored, negative wheezes / crackles Abdomen: Soft, non-tender, non-distended, bowel sounds,no masses, no organomegaly Muscular skeletal: Severe generalized weakness is noted, Limited  exam - in bed, able to move all 4 extremities,  Neuro: CNII-XII intact. , normal motor and sensation, reflexes intact  Extremities: No pitting edema lower extremities, +2 pulses  Skin: Dry, warm to touch, negative for any Rashes, No open wounds Wounds: per nursing documentation      Data Reviewed: I have personally reviewed following labs and imaging studies  CBC: Recent  Labs  Lab 05/28/19 1743 05/30/19 0321 05/31/19 0326 06/01/19 0742  WBC 4.9 5.7 5.6 6.1  NEUTROABS 2.3  --   --   --   HGB 8.7* 8.4* 8.4* 8.6*  HCT 26.9* 25.4* 25.7* 26.2*  MCV 97.5 93.7 94.5 94.6  PLT 271 349 339 367   Basic Metabolic Panel: Recent Labs  Lab 05/28/19 1846 05/29/19 0015 05/30/19 0321 05/31/19 0326 06/01/19 0742  NA 133* 136 134* 135 135  K 5.8* 3.9 4.0 4.2 4.2  CL 99 103 101 104 106  CO2 25 24 24 23  19*  GLUCOSE 89 92 81 82 106*  BUN 24* 22 21 20 17   CREATININE 2.26* 2.02* 1.75* 1.61* 1.40*  CALCIUM 8.3* 8.2* 8.1* 8.0* 8.4*  MG 1.5*  --  1.8  --   --   PHOS 3.5  --   --   --   --    GFR: Estimated Creatinine Clearance: 43.3 mL/min (A) (by C-G formula based on SCr of 1.4 mg/dL (H)). Liver Function Tests: Recent Labs  Lab 05/28/19 1846  AST 37  ALT 12  ALKPHOS 89  BILITOT 1.2  PROT 6.2*  ALBUMIN 2.4*   No results for input(s): LIPASE, AMYLASE in the last 168 hours. Recent Labs  Lab 05/28/19 2025  AMMONIA 18   Coagulation Profile: No results for input(s): INR, PROTIME in the last 168 hours. Cardiac Enzymes: No results for input(s): CKTOTAL, CKMB, CKMBINDEX, TROPONINI in the last 168 hours. BNP (last 3 results) No results for input(s): PROBNP in the last 8760 hours. HbA1C: No results for input(s): HGBA1C in the last 72 hours. CBG: Recent Labs  Lab 05/28/19 1647  GLUCAP 89   Lipid Profile: No results for input(s): CHOL, HDL, LDLCALC, TRIG, CHOLHDL, LDLDIRECT in the last 72 hours. Thyroid Function Tests: No results for input(s): TSH, T4TOTAL, FREET4, T3FREE, THYROIDAB in the last 72 hours. Anemia Panel: Recent Labs    05/29/19 1537 05/29/19 1831  VITAMINB12 1,209*  --   FOLATE 21.2  --   FERRITIN 990*  --   TIBC 199*  --   IRON 29  --   RETICCTPCT  --  1.7   Sepsis Labs: Recent Labs  Lab 05/28/19 2025 05/29/19 0015  LATICACIDVEN 0.9 1.0    Recent Results (from the past 240 hour(s))  SARS CORONAVIRUS 2 Nasal Swab  Aptima Multi Swab     Status: None   Collection Time: 05/28/19  8:58 PM   Specimen: Aptima Multi Swab; Nasal Swab  Result Value Ref Range Status   SARS Coronavirus 2 NEGATIVE NEGATIVE Final    Comment: (NOTE) SARS-CoV-2 target nucleic acids are NOT DETECTED. The SARS-CoV-2 RNA is generally detectable in upper and lower respiratory specimens during the acute phase of infection. Negative results do not preclude SARS-CoV-2 infection, do not rule out co-infections with other pathogens, and should not be used as the sole basis for treatment or other patient management decisions. Negative results must be combined with clinical observations, patient history, and epidemiological information. The expected result is Negative. Fact Sheet for Patients: 07/29/19 Fact Sheet for Healthcare Providers:  quierodirigir.com This test is not yet approved or cleared by the [place] and  has been authorized for detection and/or diagnosis of SARS-CoV-2 by FDA under an Emergency Use Authorization (EUA). This EUA will remain  in effect (meaning this test can be used) for the duration of the COVID-19 declaration under Section 56 4(b)(1) of the Act, 21 U.S.C. section 360bbb-3(b)(1), unless the authorization is terminated or revoked sooner. Performed at [facility], [address], [place], [place] [address]          Radiology Studies: No results found.      Scheduled Meds: . aspirin EC  81 mg Oral Daily  . clopidogrel  75 mg Oral Daily  . colestipol  1 g Oral BID  . dexamethasone (DECADRON) injection  4 mg Intravenous Q12H  . docusate sodium  100 mg Oral BID  . DULoxetine  60 mg Oral Daily  . feeding supplement (ENSURE ENLIVE)  237 mL Oral TID BM  . hydroxychloroquine  200 mg Oral BID  . megestrol  400 mg Oral BID  . multivitamin with minerals  1 tablet Oral Daily  . pantoprazole  40 mg Oral BID  . polyethylene glycol   17 g Oral Daily   Continuous Infusions: . sodium chloride 75 mL/hr at 05/31/19 1354     LOS: 4 days    Time spent: 30 minutes    Kendell Bane, MD  [facility] Pager [phone]  If 7PM-7AM, please contact night-coverage [email or web address] Password [facility] 06/01/2019, 12:33 PM

## 2019-06-01 NOTE — TOC Progression Note (Addendum)
Transition of Care [facility]) - Progression Note    Patient Details  Name: Michelle Graves MRN: [id number] Date of Birth: [birth year]  Transition of Care [facility]) CM/SW [place], [place] Phone Number: 06/01/2019, 10:20 AM  Clinical Narrative:  11:49am- CSW spoke with pt daughter, she has confirmed desire for [facility] : [facility]. Irine Seal, admissions liaison at [facility] will initiate insurance auth. Requested new COVID test from MD.     10:21am- CSW called pt daughter [name], she is reviewing the packet of offers and pt daughter top 2 choices are [employer] and [employer]. She is requesting [place] give her a call; message left with Irine Seal to reach out to daughter.   Pt daughter also asking for MD to call with any updates and medical care plan. Epic secure chat sent to attending MD.    Expected Discharge Plan: Skilled Nursing Facility Barriers to Discharge: Ship broker, Continued Medical Work up  Expected Discharge Plan and Services Expected Discharge Plan: [place] In-house Referral: Clinical Social Work Discharge Planning Services: NA Post Acute Care Choice: [place] Living arrangements for the past 2 months: Apartment                 DME Arranged: N/A DME Agency: NA HH Arranged: NA [place] Agency: NA   Social Determinants of Health (SDOH) Interventions    Readmission Risk Interventions Readmission Risk Prevention Plan 05/31/2019  Transportation Screening Complete  PCP or Specialist Appt within 3-5 Days Not Complete  Not Complete comments plan for SNF  HRI or [place] Not Complete  HRI or Home Care Consult comments plan for SNF  Social Work Consult for [place] Planning/Counseling Complete  Palliative Care Screening Not Applicable  Medication Review Press photographer) Complete  Some recent data might be hidden

## 2019-06-02 LAB — CBC WITH DIFFERENTIAL/PLATELET
Abs Immature Granulocytes: 0.41 10*3/uL — ABNORMAL HIGH (ref 0.00–0.07)
Basophils Absolute: 0 10*3/uL (ref 0.0–0.1)
Basophils Relative: 0 %
Eosinophils Absolute: 0 10*3/uL (ref 0.0–0.5)
Eosinophils Relative: 0 %
HCT: 30.9 % — ABNORMAL LOW (ref 36.0–46.0)
Hemoglobin: 10 g/dL — ABNORMAL LOW (ref 12.0–15.0)
Immature Granulocytes: 5 %
Lymphocytes Relative: 25 %
Lymphs Abs: 1.9 10*3/uL (ref 0.7–4.0)
MCH: 30.7 pg (ref 26.0–34.0)
MCHC: 32.4 g/dL (ref 30.0–36.0)
MCV: 94.8 fL (ref 80.0–100.0)
Monocytes Absolute: 0.9 10*3/uL (ref 0.1–1.0)
Monocytes Relative: 13 %
Neutro Abs: 4.3 10*3/uL (ref 1.7–7.7)
Neutrophils Relative %: 57 %
Platelets: 364 10*3/uL (ref 150–400)
RBC: 3.26 MIL/uL — ABNORMAL LOW (ref 3.87–5.11)
RDW: 17.6 % — ABNORMAL HIGH (ref 11.5–15.5)
WBC: 7.6 10*3/uL (ref 4.0–10.5)
nRBC: 0 % (ref 0.0–0.2)

## 2019-06-02 LAB — COMPREHENSIVE METABOLIC PANEL
ALT: 10 U/L (ref 0–44)
AST: 15 U/L (ref 15–41)
Albumin: 2.3 g/dL — ABNORMAL LOW (ref 3.5–5.0)
Alkaline Phosphatase: 90 U/L (ref 38–126)
Anion gap: 11 (ref 5–15)
BUN: 17 mg/dL (ref 8–23)
CO2: 17 mmol/L — ABNORMAL LOW (ref 22–32)
Calcium: 8.5 mg/dL — ABNORMAL LOW (ref 8.9–10.3)
Chloride: 106 mmol/L (ref 98–111)
Creatinine, Ser: 1.35 mg/dL — ABNORMAL HIGH (ref 0.44–1.00)
GFR calc Af Amer: 47 mL/min — ABNORMAL LOW (ref >60)
GFR calc non Af Amer: 41 mL/min — ABNORMAL LOW (ref >60)
Glucose, Bld: 97 mg/dL (ref 70–99)
Potassium: 4.2 mmol/L (ref 3.5–5.1)
Sodium: 134 mmol/L — ABNORMAL LOW (ref 135–145)
Total Bilirubin: 0.6 mg/dL (ref 0.3–1.2)
Total Protein: 5.8 g/dL — ABNORMAL LOW (ref 6.5–8.1)

## 2019-06-02 LAB — CORTISOL: Cortisol, Plasma: 4.6 ug/dL

## 2019-06-02 MED ORDER — ADULT MULTIVITAMIN W/MINERALS CH: 1.0000 | ORAL_TABLET | Freq: Every day | ORAL | 2 refills | Status: DC

## 2019-06-02 MED ORDER — DOCUSATE SODIUM 100 MG PO CAPS: 100.0000 mg | ORAL_CAPSULE | Freq: Two times a day (BID) | ORAL | 0 refills | Status: DC

## 2019-06-02 MED ORDER — MEGESTROL ACETATE 400 MG/10ML PO SUSP: 400.0000 mg | Freq: Two times a day (BID) | ORAL | 0 refills | Status: DC

## 2019-06-02 MED ORDER — METOPROLOL TARTRATE 12.5 MG HALF TABLET
12.5000 mg | ORAL_TABLET | Freq: Two times a day (BID) | ORAL | Status: DC
  Administered 2019-06-02: 12.5 mg via ORAL
  Filled 2019-06-02: qty 1

## 2019-06-02 MED ORDER — ENSURE ENLIVE PO LIQD: 237.0000 mL | Freq: Three times a day (TID) | ORAL | 12 refills | Status: DC

## 2019-06-02 NOTE — Social Work (Signed)
Clinical Social Worker facilitated patient discharge including contacting patient family and facility to confirm patient discharge plans.  Clinical information faxed to facility and family agreeable with plan.  CSW arranged ambulance transport via [facility] to [facility] [place] to call [phone] with report prior to discharge.  Clinical Social Worker will sign off for now as social work intervention is no longer needed. Please consult [place] again if new need arises.  Westley Hummer, MSW, [place] Social Worker [phone]

## 2019-06-02 NOTE — Progress Notes (Signed)
Calorie Count Note  RD working remotely.  48 hour calorie count ordered.  Diet: Heart Healthy Supplements: Magic cup TID with meals, each supplement provides 290 kcal and 9 grams of protein; MVI with minerals daily; Ensure Enlive po TID, each supplement provides 350 kcal and 20 grams of protein  Spoke with pt at bedside, who was in good spirits today. She reports her appetite is improving and getting better each day. She really enjoyed her breakfast this morning. She has been consuming Ensure supplements (really likes vanilla). Discussed importance of good meal and supplement intake to help promote healing.   05/31/19 Breakfast: 116 kcals, 3 grams protein Lunch: 109 kcals, 5 grams protein Dinner: no data available to assess Supplements: 2 Ensure supplements (700 kcals and 40 grams protein)  Total intake: 952 kcal (56% of minimum estimated needs)  48 grams protein (56% of minimum estimated needs)  06/01/19 Breakfast: 467 kcals, 8 grams protein Lunch: 62 kcals, 2 grams Dinner: 152 kcals, 9 grams protein Supplements: 3 Ensure supplements (1050 kcals, 60 grams protein)  Total intake: 1731 kcal (100% of minimum estimated needs)  79 grams protein (93% of minimum estimated needs)  Total intake: 1731 kcal (100% of minimum estimated needs)  79 grams protein (93% of minimum estimated needs)  Average Total intake: 1342 kcal (79% of minimum estimated needs)  64 grams protein (75% of minimum estimated needs)  Nutrition Dx: Inadequate oral intakerelated to decreased appetiteas evidenced by per patient/family report; ongoing  Goal: Patient will meet greater than or equal to 90% of their needs; progressing   Intervention:   -D/c calorie count -ContinueEnsure Enlive poTID, each supplement provides 350 kcal and 20 grams of protein -Continue MVI with minerals daily -Continue Magic cup TID with meals, each supplement provides 290 kcal and 9 grams of protein  Kensie Susman A. Jimmye Norman, RD,  LDN, [place] Registered Dietitian II Certified Diabetes Care and Education Specialist Pager: [phone] After hours Pager: [phone]

## 2019-06-02 NOTE — Plan of Care (Signed)

## 2019-06-02 NOTE — Progress Notes (Addendum)
Physician Discharge Summary [facility]    Patient: Michelle Graves                   Admit date: 05/28/2019   DOB: Oct 06, 1951             Discharge date:06/02/2019/12:05 PM IRW:[id number]                          PCP: [facility]  Disposition: Home   Recommendations for Outpatient Follow-up:   . 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 Patient has had multiple hospitalizations over the last several months for CMV colitis. She also has been evaluated for increasing weakness in her legs. During her admission at [facility] from July 17 to May 16, 2019 she had a complete work-up and neurology consultation.Serologiesincluded CK, Aldotase, LDH, anti-Jo, anti-Ro, anti-La, and anti-Smith antibodies. She had anti-VG CC antibodies tested. She had MRI of the thoracic and lumbar spine which did reveal spinal stenosis in the lumbar region but not to a degree that would cause lower extremity weakness. Final diagnosis was deconditioning exacerbated by left ankle sprain. Patient did return home for short time but then due toincreasing weakness and inability to manage her ADLs he returned to her daughter's home. For the past several days the patient's had increasing weakness generally. She has had increasing nausea and multiple episodes of vomiting day prior to  admission. She admits to poor p.o. intake of both food and fluids. Because of her symptoms and weakness she was brought by ambulance to the [facility] emergency department.She denies any acute pain or any focal neurologic symptoms. She has completed a full course of ganciclovir for CMV colitis.  Patient was admitted with AKI secondary to poor oral intake with recurrent nausea and vomiting with eating. She was recently treated for CMV colitis and was scheduled to see her gastroenterologist on 8/12. She was previously noted to have linear gastric ulcerations on endoscopy on 03/2019 along with colonoscopy at that time with ulcerations throughout her colon concerning for ischemic colitis. She appears to be having some improvement in her creatinine levels with prior baseline between 1.2-1.7.  8/4: Patient continues to have poor appetite.  Megace was initiated 8/3 and likely will take a few days to take full effect.  Calorie count initiated today.  A.m. cortisol noted to be low and will pursue further with ACTH stimulation test.  Likely may have a component of adrenal insufficiency with initial mild hyponatremia and soft blood pressure readings noted.  Will start on IV dexamethasone for now.  PT recommending SNF on discharge which is appropriate.   Detailed discharge summary/assessment & Plan:   Active Problems:   Weakness of both lower extremities   Small vessel disease, cerebrovascular   Acute left ankle pain   AKI (acute kidney injury) ([place])  Nausea & vomiting   CMV colitis (HCC)   Anemia, chronic disease   AKI-likely prerenal due to poor oral intake (baseline 1.2-1.7)-improving -Improved with IV fluids -Continue to encourage p.o. intake -Avoid nephrotoxic agents   Intermittent abdominal pain with nausea and vomiting -Stable no complaint today.  -resolving, now with just poor appetite -Patient has had recent upper and lower endoscopy on 03/2019 with linear gastric erosions  as well as findings of ischemic colitis -No signs or symptoms to suspect ischemic bowel or other acute pathology -KUB demonstrates large stool burden andenema given on 8/2 with minimal bowel movement -Continue MiraLAX daily for now and repeat enema as needed -Zofran as needed -Nutritional supplementation -Appetite stimulation with Megace and advance diet -Dietitian consulted --recommended diet appreciated  Possible adrenal insufficiency -Low a.m. cortisol and will investigate further with ACTH stimulation >> inconclusive -Please follow-up with PCP for further investigation -On  dexamethasone IV twice daily for 2 days -Blood pressure remained stable   Prior CMV colitis status post treatment with ganciclovir -Currently without acute symptomatology related to this -Continue to monitor closely and will need GI follow-up outpatient, does not appear to require any inpatient evaluation  Persistent lower extremity weakness-identified in setting of prior CVA -Appreciate OT evaluation and PT evaluationwith recommendations for SNF -Continue aspirin and Plavix treatment  Chronic anemia-stable;likely iron deficiency based on prior anemia panel -Continue on PPI twice daily for now -Stable anemia panel currently -Recheck CBC in a.m.  Hypertension-soft blood pressure readings -Blood pressure improving 117/86 -Continue home blood pressure medication with exception of clonidine - clonidine DC'd  Protein calorie malnutrition -Appreciate evaluation and dietary recommendations.   Code Status:full code  Family Communication:Discussed over phone with daughter on 8/2; tried calling 8/4 with no answer Disposition Plan:D/C to  SNF  Consultants:  None  Procedures:  None  Antimicrobials:   None  Discharge Instructions:   Discharge Instructions    Activity as tolerated - No restrictions   Complete by: As directed    Call MD for:  difficulty breathing, headache or visual  disturbances   Complete by: As directed    Call MD for:  redness, tenderness, or signs of infection (pain, swelling, redness, odor or green/yellow discharge around incision site)   Complete by: As directed    Diet - low sodium heart healthy   Complete by: As directed    Discharge instructions   Complete by: As directed    F/up with PCP in 2 wks   Increase activity slowly   Complete by: As directed        Medication List    STOP taking these medications   amLODipine 10 MG tablet Commonly known as: NORVASC   antiseptic oral rinse Liqd   Biofreeze 4 % Gel Generic drug: Menthol (Topical Analgesic)   CLEAR EYES FOR DRY EYES OP   cloNIDine 0.3 MG tablet Commonly known as: CATAPRES   gabapentin 600 MG tablet Commonly known as: NEURONTIN   HYDROcodone-acetaminophen 5-325 MG tablet Commonly known as: NORCO/VICODIN     TAKE these medications   aspirin 81 MG EC tablet Take 1 tablet (81 mg total) by mouth daily.   calcium carbonate 1500 (600 Ca) MG Tabs tablet Commonly known as: OSCAL Take 600 mg of elemental calcium by mouth daily.   clopidogrel 75 MG tablet Commonly known as: PLAVIX Take 1 tablet (75 mg total) by mouth daily.   colestipol 1 g tablet Commonly known as: COLESTID Take 1 tablet (1 g total) by mouth 2 (  two) times daily.   docusate sodium 100 MG capsule Commonly known as: COLACE Take 1 capsule (100 mg total) by mouth 2 (two) times daily.   DULoxetine 60 MG capsule Commonly known as: CYMBALTA Take 60 mg by mouth daily. What changed: Another medication with the same name was removed. Continue taking this medication, and follow the directions you see here.   feeding supplement (ENSURE ENLIVE) Liqd Take 237 mLs by mouth 3 (three) times daily between meals.   furosemide 40 MG tablet Commonly known as: LASIX Take 1 tablet (40 mg total) by mouth daily.   hydroxychloroquine 200 MG tablet Commonly known as: PLAQUENIL Take 200 mg by mouth 2 (two) times  daily.   megestrol 400 MG/10ML suspension Commonly known as: MEGACE Take 10 mLs (400 mg total) by mouth 2 (two) times daily.   metoprolol tartrate 100 MG tablet Commonly known as: LOPRESSOR Take 100 mg by mouth 2 (two) times daily.   multivitamin with minerals Tabs tablet Take 1 tablet by mouth daily. Start taking on: June 03, 2019   ondansetron 4 MG tablet Commonly known as: ZOFRAN Take 1 tablet (4 mg total) by mouth every 6 (six) hours as needed for nausea.   OSTEO BI-FLEX JOINT SHIELD PO Take 1 tablet by mouth daily.   pantoprazole 40 MG tablet Commonly known as: PROTONIX Take 1 tablet (40 mg total) by mouth 2 (two) times daily. What changed:   when to take this  reasons to take this   terbinafine 250 MG tablet Commonly known as: LAMISIL Take 250 mg by mouth daily.   traZODone 100 MG tablet Commonly known as: DESYREL Take 100 mg by mouth at bedtime.      Contact information for after-discharge care    Destination    [facility] .   Service: Skilled Nursing Contact information: [address] [place] [address] [phone]             No Known Allergies   Procedures /Studies:   Dg Chest 2 View  Result Date: 05/13/2019 CLINICAL DATA:  Generalized weakness today. EXAM: CHEST - 2 VIEW COMPARISON:  Single-view of the chest 03/30/2019. PA and lateral chest 11/21/2018. FINDINGS: The lungs are clear. There is cardiomegaly. No pneumothorax or pleural fluid. IMPRESSION: Cardiomegaly without acute disease. Electronically Signed   By: Drusilla Kanner M.D.   On: 05/13/2019 16:45   Dg Tibia/fibula Left  Result Date: 05/28/2019 CLINICAL DATA:  All over lower left leg pain. Pt states it has been painful for months. States she was just in the hospital for colitis. No hx of injury to the lower left leg. EXAM: LEFT TIBIA AND FIBULA - 2 VIEW COMPARISON:  None. FINDINGS: No fracture or bone lesion. Knee and ankle joints are normally  spaced and aligned. Soft tissues are unremarkable. IMPRESSION: Negative. Electronically Signed   By: Amie Portland M.D.   On: 05/28/2019 18:07   Dg Ankle Complete Left  Result Date: 05/15/2019 CLINICAL DATA:  68 year old who sustained a LEFT ankle injury when getting out of the bed yesterday. DORSAL foot and ankle pain. EXAM: LEFT ANKLE COMPLETE - 3+ VIEW COMPARISON:  None. FINDINGS: Diffuse soft tissue swelling. No evidence of acute fracture or dislocation. Tibiotalar joint anatomically aligned with well-preserved joint space. Accessory ossicle adjacent to the tip of the MEDIAL malleolus versus a remote injury with dystrophic calcification. Small ankle joint effusion suspected. IMPRESSION: No acute osseous abnormality. Electronically Signed   By: Kayren Eaves.D.  On: 05/15/2019 14:23   Dg Abd 1 View  Result Date: 05/29/2019 CLINICAL DATA:  Abdominal pain with improvement. EXAM: ABDOMEN - 1 VIEW COMPARISON:  CT abdomen pelvis 03/25/2019 FINDINGS: Relative paucity of small bowel gas. Large volume stool throughout the colon. Pelvic phleboliths. Lumbar spine degenerative changes. IMPRESSION: Large volume stool throughout the colon. Paucity of small bowel gas. Electronically Signed   By: Annia Beltrew  Davis M.D.   On: 05/29/2019 11:28   Ct Head Wo Contrast  Result Date: 05/13/2019 CLINICAL DATA:  Chronic left-sided deficits with altered mental status EXAM: CT HEAD WITHOUT CONTRAST TECHNIQUE: Contiguous axial images were obtained from the base of the skull through the vertex without intravenous contrast. COMPARISON:  March 29, 2019 FINDINGS: Brain: Mild diffuse atrophy is stable. There is no intracranial mass, hemorrhage, extra-axial fluid collection, or midline shift. There is extensive small vessel disease in the centra semiovale bilaterally, stable. No acute infarct is demonstrable on this current examination. Vascular: There is no hyperdense vessel. There is calcification in each carotid siphon region. Skull:  Bony calvarium appears intact. Sinuses/Orbits: There is mucosal thickening in several ethmoid air cells. Other paranasal sinuses are clear. There is rightward deviation of the nasal septum. Orbits appear symmetric bilaterally. Other: Mastoid air cells are clear. IMPRESSION: Stable atrophy with periventricular small vessel disease. No acute infarct evident. No mass or hemorrhage. There are foci of arterial vascular calcification. There is mucosal thickening in several ethmoid air cells. There is nasal septal deviation. Electronically Signed   By: Bretta BangWilliam  Woodruff III M.D.   On: 05/13/2019 08:24   Mr Brain Wo Contrast  Result Date: 05/13/2019 CLINICAL DATA:  Bilateral lower extremity proximal muscle weakness. Stroke in 02/2019 with left-sided deficits. EXAM: MRI HEAD WITHOUT CONTRAST TECHNIQUE: Multiplanar, multiecho pulse sequences of the brain and surrounding structures were obtained without intravenous contrast. COMPARISON:  Head CT 05/13/2019 and MRI 03/30/2019 FINDINGS: The study is mildly motion degraded. Brain: There is a 5 mm acute infarct involving subcortical white matter anteriorly in the right frontal lobe (series 5, image 85). The acute lacunar infarct in the right centrum semiovale on the prior MRI demonstrates expected interval evolution, now with focal encephalomalacia. There are numerous chronic supratentorial and infratentorial microhemorrhages with greatest involvement of the thalami suggestive of chronic hypertension. Many more microhemorrhages are apparent on today's study compared to the prior MRI due to the inclusion of susceptibility weighted imaging. Patchy and confluent T2 hyperintensities in the cerebral white matter are similar to the prior MRI and nonspecific but compatible with severe chronic small vessel ischemic disease. Milder chronic small vessel changes are present in the pons. A chronic infarct is again seen involving the right corona radiata and basal ganglia, and there also  chronic lacunar infarcts in the thalami. No mass, midline shift, or extra-axial fluid collection is identified. There is moderate cerebral atrophy. Vascular: Major intracranial vascular flow voids are preserved. Skull and upper cervical spine: Unremarkable bone marrow signal. Sinuses/Orbits: Unremarkable orbits. Paranasal sinuses and mastoid air cells are clear. Other: None. IMPRESSION: 1. Acute subcentimeter white matter infarct in the right frontal lobe. 2. Severe chronic small vessel ischemia with chronic lacunar infarcts as above. 3. Numerous chronic microhemorrhages suggesting chronic hypertension. Electronically Signed   By: Sebastian AcheAllen  Grady M.D.   On: 05/13/2019 22:22   Mr Thoracic Spine Wo Contrast  Result Date: 05/14/2019 CLINICAL DATA:  Myelopathy. EXAM: MRI THORACIC SPINE WITHOUT CONTRAST TECHNIQUE: Multiplanar, multisequence MR imaging of the thoracic spine was performed. No intravenous contrast was administered.  COMPARISON:  CT chest 03/26/2019 FINDINGS: Alignment:  Mild curvature convex to the left. Vertebrae: No fracture or significant lesion. Benign hemangioma within the superior T10 vertebral body. Cord: No cord compression or primary cord lesion. Imaging is somewhat degraded by motion. Paraspinal and other soft tissues: Negative Disc levels: No significant disc pathology. Disc bulge at T6-7 without neural compression. Canal and foramina are widely patent throughout the thoracic region. IMPRESSION: No significant thoracic region finding. No evidence of cord compression or primary cord lesion. No spinal stenosis or neural compression. Electronically Signed   By: Paulina Fusi M.D.   On: 05/14/2019 12:16   Mr Lumbar Spine Wo Contrast  Result Date: 05/14/2019 CLINICAL DATA:  Myelopathy EXAM: MRI LUMBAR SPINE WITHOUT CONTRAST TECHNIQUE: Multiplanar, multisequence MR imaging of the lumbar spine was performed. No intravenous contrast was administered. COMPARISON:  Thoracic study same day.  Lumbar MRI  02/17/2018. FINDINGS: Segmentation:  5 lumbar type vertebral bodies. Alignment:  5 mm anterolisthesis L4-5. Vertebrae:  No fracture or primary bone lesion. Conus medullaris and cauda equina: Conus extends to the L1-2 level. Conus and cauda equina appear normal. Paraspinal and other soft tissues: Negative Disc levels: No abnormality of significance at L2-3 or above. No canal or foraminal stenosis. L3-4: Mild bulging of the disc. Facet and ligamentous hypertrophy. Mild narrowing of the lateral recesses and foramina but without definite neural compression. L4-5: 5 mm of anterolisthesis due to chronic facet arthropathy. Bulging of the disc with biforaminal protrusions right more than left. Stenosis of the lateral recesses and neural foramina that could cause neural compression on either or both sides. L5-S1: Mild bulging of the disc. Mild facet osteoarthritis. No canal or foraminal stenosis. Compared to the study of last year, findings appear quite similar. IMPRESSION: L4-5: Multifactorial spinal stenosis that could cause neural compression in the lateral recesses or neural foramina on either or both sides. Advanced facet arthropathy with 5 mm of anterolisthesis. Bulging of the disc with biforaminal protrusions right more than left. L3-4: Disc bulge. Facet hypertrophy. Mild lateral recess and foraminal narrowing but without definite neural compression. L5-S1: Bulging of the disc. Mild facet degeneration. No apparent compressive stenosis. Electronically Signed   By: Paulina Fusi M.D.   On: 05/14/2019 12:21     Subjective:   Patient was seen and examined 06/02/2019, 12:05 PM Patient stable today. No acute distress.  No issues overnight Stable for discharge.  Discharge Exam:    Vitals:   06/01/19 0451 06/01/19 1347 06/01/19 2123 06/02/19 0444  BP: 117/86 131/88 128/83 (!) 140/99  Pulse: (!) 109 (!) 103 (!) 105 (!) 105  Resp: Temp: 98.9 F (37.2 C) 97.9 F (36.6 C) 98.5 F (36.9 C) 98.4 F  (36.9 C)  TempSrc: Oral Oral Oral Oral  SpO2: 96% 98% 99% 99%  Weight:      Height:        General: Pt lying comfortably in bed & appears in no obvious distress. Cardiovascular: S1 & S2 heard, RRR, S1/S2 +. No murmurs, rubs, gallops or clicks. No JVD or pedal edema. Respiratory: Clear to auscultation without wheezing, rhonchi or crackles. No increased work of breathing. Abdominal:  Non-distended, non-tender & soft. No organomegaly or masses appreciated. Normal bowel sounds heard. CNS: Alert and oriented. No focal deficits. Extremities: no edema, no cyanosis    The results of significant diagnostics from this hospitalization (including imaging, microbiology, ancillary and laboratory) are listed below for reference.      Microbiology:  Recent Results (from the past 240 hour(s))  SARS CORONAVIRUS 2 Nasal Swab Aptima Multi Swab     Status: None   Collection Time: 05/28/19  8:58 PM   Specimen: Aptima Multi Swab; Nasal Swab  Result Value Ref Range Status   SARS Coronavirus 2 NEGATIVE NEGATIVE Final    Comment: (NOTE) SARS-CoV-2 target nucleic acids are NOT DETECTED. The SARS-CoV-2 RNA is generally detectable in upper and lower respiratory specimens during the acute phase of infection. Negative results do not preclude SARS-CoV-2 infection, do not rule out co-infections with other pathogens, and should not be used as the sole basis for treatment or other patient management decisions. Negative results must be combined with clinical observations, patient history, and epidemiological information. The expected result is Negative. Fact Sheet for Patients: HairSlick.no Fact Sheet for Healthcare Providers: quierodirigir.com This test is not yet approved or cleared by the [place] FDA and  has been authorized for detection and/or diagnosis of SARS-CoV-2 by FDA under an Emergency Use Authorization (EUA). This EUA will remain  in  effect (meaning this test can be used) for the duration of the COVID-19 declaration under Section 56 4(b)(1) of the Act, 21 U.S.C. section 360bbb-3(b)(1), unless the authorization is terminated or revoked sooner. Performed at [facility], [address], [place], [place] [address]   SARS CORONAVIRUS 2 Nasal Swab Aptima Multi Swab     Status: None   Collection Time: 06/01/19  2:14 PM   Specimen: Aptima Multi Swab; Nasal Swab  Result Value Ref Range Status   SARS Coronavirus 2 NEGATIVE NEGATIVE Final    Comment: (NOTE) SARS-CoV-2 target nucleic acids are NOT DETECTED. The SARS-CoV-2 RNA is generally detectable in upper and lower respiratory specimens during the acute phase of infection. Negative results do not preclude SARS-CoV-2 infection, do not rule out co-infections with other pathogens, and should not be used as the sole basis for treatment or other patient management decisions. Negative results must be combined with clinical observations, patient history, and epidemiological information. The expected result is Negative. Fact Sheet for Patients: HairSlick.no Fact Sheet for Healthcare Providers: quierodirigir.com This test is not yet approved or cleared by the [place] FDA and  has been authorized for detection and/or diagnosis of SARS-CoV-2 by FDA under an Emergency Use Authorization (EUA). This EUA will remain  in effect (meaning this test can be used) for the duration of the COVID-19 declaration under Section 56 4(b)(1) of the Act, 21 U.S.C. section 360bbb-3(b)(1), unless the authorization is terminated or revoked sooner. Performed at [facility], [address], [place], [place] [address]      Labs:   CBC: Recent Labs  Lab 05/28/19 1743 05/30/19 0321 05/31/19 0326 06/01/19 0742 06/02/19 0858  WBC 4.9 5.7 5.6 6.1 7.6  NEUTROABS 2.3  --   --   --  4.3  HGB 8.7* 8.4* 8.4* 8.6* 10.0*  HCT  26.9* 25.4* 25.7* 26.2* 30.9*  MCV 97.5 93.7 94.5 94.6 94.8  PLT 271 349 339 367 364   Basic Metabolic Panel: Recent Labs  Lab 05/28/19 1846 05/29/19 0015 05/30/19 0321 05/31/19 0326 06/01/19 0742 06/02/19 0858  NA 133* 136 134* 135 135 134*  K 5.8* 3.9 4.0 4.2 4.2 4.2  CL 99 103 101 104 106 106  CO2 19* 17*  GLUCOSE 89 92 81 82 106* 97  BUN 24* CREATININE 2.26* 2.02* 1.75* 1.61* 1.40* 1.35*  CALCIUM 8.3* 8.2*  8.1* 8.0* 8.4* 8.5*  MG 1.5*  --  1.8  --   --   --   PHOS 3.5  --   --   --   --   --    Liver Function Tests: Recent Labs  Lab 05/28/19 1846 06/02/19 0858  AST 37 15  ALT 12 10  ALKPHOS 89 90  BILITOT 1.2 0.6  PROT 6.2* 5.8*  ALBUMIN 2.4* 2.3*   BNP (last 3 results) No results for input(s): BNP in the last 8760 hours. Cardiac Enzymes: No results for input(s): CKTOTAL, CKMB, CKMBINDEX, TROPONINI in the last 168 hours. CBG: Recent Labs  Lab 05/28/19 1647  GLUCAP 89  Urinalysis    Component Value Date/Time   COLORURINE YELLOW 05/28/2019 2137   APPEARANCEUR CLEAR 05/28/2019 2137   LABSPEC 1.008 05/28/2019 2137   PHURINE 6.0 05/28/2019 2137   GLUCOSEU 50 (A) 05/28/2019 2137   HGBUR MODERATE (A) 05/28/2019 2137   BILIRUBINUR NEGATIVE 05/28/2019 2137   KETONESUR NEGATIVE 05/28/2019 2137   PROTEINUR NEGATIVE 05/28/2019 2137   UROBILINOGEN 1.0 06/24/2010 1850   NITRITE NEGATIVE 05/28/2019 2137   LEUKOCYTESUR NEGATIVE 05/28/2019 2137    Time coordinating discharge: Over 40 minutes  SIGNED: Kendell Bane, MD, FACP, [facility]. [facility],  Pager [phone]  If 7PM-7AM, please contact night-coverage [email or web address], Password [facility] 06/02/2019, 12:05 PM

## 2019-06-02 NOTE — Discharge Summary (Signed)
Physician Discharge Summary [facility]    Patient: Michelle Graves                   Admit date: 05/28/2019   DOB: 31-Aug-1951             Discharge date:06/02/2019/10:18 PM [id number]                          PCP: [facility]  Disposition: Home   Recommendations for Outpatient Follow-up:   . 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 Patient has had multiple hospitalizations over the last several months for CMV colitis. She also has been evaluated for increasing weakness in her legs. During her admission at [facility] from July 17 to May 16, 2019 she had a complete work-up and neurology consultation.Serologiesincluded CK, Aldotase, LDH, anti-Jo, anti-Ro, anti-La, and anti-Smith antibodies. She had anti-VG CC antibodies tested. She had MRI of the thoracic and lumbar spine which did reveal spinal stenosis in the lumbar region but not to a degree that would cause lower extremity weakness. Final diagnosis was deconditioning exacerbated by left ankle sprain. Patient did return home for short time but then due toincreasing weakness and inability to manage her ADLs he returned to her Michelle Graves's home. For the past several days the patient's had increasing weakness generally. She has had increasing nausea and multiple episodes of vomiting day prior to  admission. She admits to poor p.o. intake of both food and fluids. Because of her symptoms and weakness she was brought by ambulance to the [facility] emergency department.She denies any acute pain or any focal neurologic symptoms. She has completed a full course of ganciclovir for CMV colitis.  Patient was admitted with AKI secondary to poor oral intake with recurrent nausea and vomiting with eating. She was recently treated for CMV colitis and was scheduled to see her gastroenterologist on 8/12. She was previously noted to have linear gastric ulcerations on endoscopy on 03/2019 along with colonoscopy at that time with ulcerations throughout her colon concerning for ischemic colitis. She appears to be having some improvement in her creatinine levels with prior baseline between 1.2-1.7.  8/4: Patient continues to have poor appetite.  Megace was initiated 8/3 and likely will take a few days to take full effect.  Calorie count initiated today.  A.m. cortisol noted to be low and will pursue further with ACTH stimulation test.  Likely may have a component of adrenal insufficiency with initial mild hyponatremia and soft blood pressure readings noted.  Will start on IV dexamethasone for now.  PT recommending SNF on discharge which is appropriate.   Detailed discharge summary/assessment & Plan:   Active Problems:   Weakness of both lower extremities   Small vessel disease, cerebrovascular   Acute left ankle pain   AKI (acute kidney injury) ([place])  Nausea & vomiting   CMV colitis (HCC)   Anemia, chronic disease   AKI-likely prerenal due to poor oral intake (baseline 1.2-1.7)-improving -Improved with IV fluids -Continue to encourage p.o. intake -Avoid nephrotoxic agents   Intermittent abdominal pain with nausea and vomiting -Stable no complaint today.  -resolving, now with just poor appetite -Patient has had recent upper and lower endoscopy on 03/2019 with linear gastric erosions  as well as findings of ischemic colitis -No signs or symptoms to suspect ischemic bowel or other acute pathology -KUB demonstrates large stool burden andenema given on 8/2 with minimal bowel movement -Continue MiraLAX daily for now and repeat enema as needed -Zofran as needed -Nutritional supplementation -Appetite stimulation with Megace and advance diet -Dietitian consulted --recommended diet appreciated  Possible adrenal insufficiency -Low a.m. cortisol and will investigate further with ACTH stimulation >> inconclusive -Please follow-up with PCP for further investigation -On  dexamethasone IV twice daily for 2 days -Blood pressure remained stable   Prior CMV colitis status post treatment with ganciclovir -Currently without acute symptomatology related to this -Continue to monitor closely and will need GI follow-up outpatient, does not appear to require any inpatient evaluation  Persistent lower extremity weakness-identified in setting of prior CVA -Appreciate OT evaluation and PT evaluationwith recommendations for SNF -Continue aspirin and Plavix treatment  Chronic anemia-stable;likely iron deficiency based on prior anemia panel -Continue on PPI twice daily for now -Stable anemia panel currently -Recheck CBC in a.m.  Hypertension-soft blood pressure readings -Blood pressure improving 117/86 -Continue home blood pressure medication with exception of clonidine - clonidine DC'd  Protein calorie malnutrition -Appreciate evaluation and dietary recommendations.   Code Status:full code  Family Communication:Discussed over phone with Michelle Graves on 8/2; tried calling 8/4 with no answer Disposition Plan:D/C to  SNF  Consultants:  None  Procedures:  None  Antimicrobials:   None  Discharge Instructions:   Discharge Instructions    Activity as tolerated - No restrictions   Complete by: As directed    Call MD for:  difficulty breathing, headache or visual  disturbances   Complete by: As directed    Call MD for:  redness, tenderness, or signs of infection (pain, swelling, redness, odor or green/yellow discharge around incision site)   Complete by: As directed    Diet - low sodium heart healthy   Complete by: As directed    Discharge instructions   Complete by: As directed    F/up with PCP in 2 wks   Increase activity slowly   Complete by: As directed        Medication List    STOP taking these medications   amLODipine 10 MG tablet Commonly known as: NORVASC   antiseptic oral rinse Liqd   Biofreeze 4 % Gel Generic drug: Menthol (Topical Analgesic)   CLEAR EYES FOR DRY EYES OP   cloNIDine 0.3 MG tablet Commonly known as: CATAPRES   gabapentin 600 MG tablet Commonly known as: NEURONTIN   HYDROcodone-acetaminophen 5-325 MG tablet Commonly known as: NORCO/VICODIN     TAKE these medications   aspirin 81 MG EC tablet Take 1 tablet (81 mg total) by mouth daily.   calcium carbonate 1500 (600 Ca) MG Tabs tablet Commonly known as: OSCAL Take 600 mg of elemental calcium by mouth daily.   clopidogrel 75 MG tablet Commonly known as: PLAVIX Take 1 tablet (75 mg total) by mouth daily.   colestipol 1 g tablet Commonly known as: COLESTID Take 1 tablet (1 g total) by mouth 2 (  two) times daily.   docusate sodium 100 MG capsule Commonly known as: COLACE Take 1 capsule (100 mg total) by mouth 2 (two) times daily.   DULoxetine 60 MG capsule Commonly known as: CYMBALTA Take 60 mg by mouth daily. What changed: Another medication with the same name was removed. Continue taking this medication, and follow the directions you see here.   feeding supplement (ENSURE ENLIVE) Liqd Take 237 mLs by mouth 3 (three) times daily between meals.   furosemide 40 MG tablet Commonly known as: LASIX Take 1 tablet (40 mg total) by mouth daily.   hydroxychloroquine 200 MG tablet Commonly known as: PLAQUENIL Take 200 mg by mouth 2 (two) times  daily.   megestrol 400 MG/10ML suspension Commonly known as: MEGACE Take 10 mLs (400 mg total) by mouth 2 (two) times daily.   metoprolol tartrate 100 MG tablet Commonly known as: LOPRESSOR Take 100 mg by mouth 2 (two) times daily.   multivitamin with minerals Tabs tablet Take 1 tablet by mouth daily. Start taking on: June 03, 2019   ondansetron 4 MG tablet Commonly known as: ZOFRAN Take 1 tablet (4 mg total) by mouth every 6 (six) hours as needed for nausea.   OSTEO BI-FLEX JOINT SHIELD PO Take 1 tablet by mouth daily.   pantoprazole 40 MG tablet Commonly known as: PROTONIX Take 1 tablet (40 mg total) by mouth 2 (two) times daily. What changed:   when to take this  reasons to take this   terbinafine 250 MG tablet Commonly known as: LAMISIL Take 250 mg by mouth daily.   traZODone 100 MG tablet Commonly known as: DESYREL Take 100 mg by mouth at bedtime.      Contact information for after-discharge care    Destination    [facility] .   Service: Skilled Nursing Contact information: [address] [place] [address] [phone]             No Known Allergies   Procedures /Studies:   Dg Chest 2 View  Result Date: 05/13/2019 CLINICAL DATA:  Generalized weakness today. EXAM: CHEST - 2 VIEW COMPARISON:  Single-view of the chest 03/30/2019. PA and lateral chest 11/21/2018. FINDINGS: The lungs are clear. There is cardiomegaly. No pneumothorax or pleural fluid. IMPRESSION: Cardiomegaly without acute disease. Electronically Signed   By: Drusilla Kanner M.D.   On: 05/13/2019 16:45   Dg Tibia/fibula Left  Result Date: 05/28/2019 CLINICAL DATA:  All over lower left leg pain. Pt states it has been painful for months. States she was just in the hospital for colitis. No hx of injury to the lower left leg. EXAM: LEFT TIBIA AND FIBULA - 2 VIEW COMPARISON:  None. FINDINGS: No fracture or bone lesion. Knee and ankle joints are normally  spaced and aligned. Soft tissues are unremarkable. IMPRESSION: Negative. Electronically Signed   By: Amie Portland M.D.   On: 05/28/2019 18:07   Dg Ankle Complete Left  Result Date: 05/15/2019 CLINICAL DATA:  68 year old who sustained a LEFT ankle injury when getting out of the bed yesterday. DORSAL foot and ankle pain. EXAM: LEFT ANKLE COMPLETE - 3+ VIEW COMPARISON:  None. FINDINGS: Diffuse soft tissue swelling. No evidence of acute fracture or dislocation. Tibiotalar joint anatomically aligned with well-preserved joint space. Accessory ossicle adjacent to the tip of the MEDIAL malleolus versus a remote injury with dystrophic calcification. Small ankle joint effusion suspected. IMPRESSION: No acute osseous abnormality. Electronically Signed   By: Kayren Eaves.D.  On: 05/15/2019 14:23   Dg Abd 1 View  Result Date: 05/29/2019 CLINICAL DATA:  Abdominal pain with improvement. EXAM: ABDOMEN - 1 VIEW COMPARISON:  CT abdomen pelvis 03/25/2019 FINDINGS: Relative paucity of small bowel gas. Large volume stool throughout the colon. Pelvic phleboliths. Lumbar spine degenerative changes. IMPRESSION: Large volume stool throughout the colon. Paucity of small bowel gas. Electronically Signed   By: Annia Beltrew  Davis M.D.   On: 05/29/2019 11:28   Ct Head Wo Contrast  Result Date: 05/13/2019 CLINICAL DATA:  Chronic left-sided deficits with altered mental status EXAM: CT HEAD WITHOUT CONTRAST TECHNIQUE: Contiguous axial images were obtained from the base of the skull through the vertex without intravenous contrast. COMPARISON:  March 29, 2019 FINDINGS: Brain: Mild diffuse atrophy is stable. There is no intracranial mass, hemorrhage, extra-axial fluid collection, or midline shift. There is extensive small vessel disease in the centra semiovale bilaterally, stable. No acute infarct is demonstrable on this current examination. Vascular: There is no hyperdense vessel. There is calcification in each carotid siphon region. Skull:  Bony calvarium appears intact. Sinuses/Orbits: There is mucosal thickening in several ethmoid air cells. Other paranasal sinuses are clear. There is rightward deviation of the nasal septum. Orbits appear symmetric bilaterally. Other: Mastoid air cells are clear. IMPRESSION: Stable atrophy with periventricular small vessel disease. No acute infarct evident. No mass or hemorrhage. There are foci of arterial vascular calcification. There is mucosal thickening in several ethmoid air cells. There is nasal septal deviation. Electronically Signed   By: Bretta BangWilliam  Woodruff III M.D.   On: 05/13/2019 08:24   Mr Brain Wo Contrast  Result Date: 05/13/2019 CLINICAL DATA:  Bilateral lower extremity proximal muscle weakness. Stroke in 02/2019 with left-sided deficits. EXAM: MRI HEAD WITHOUT CONTRAST TECHNIQUE: Multiplanar, multiecho pulse sequences of the brain and surrounding structures were obtained without intravenous contrast. COMPARISON:  Head CT 05/13/2019 and MRI 03/30/2019 FINDINGS: The study is mildly motion degraded. Brain: There is a 5 mm acute infarct involving subcortical white matter anteriorly in the right frontal lobe (series 5, image 85). The acute lacunar infarct in the right centrum semiovale on the prior MRI demonstrates expected interval evolution, now with focal encephalomalacia. There are numerous chronic supratentorial and infratentorial microhemorrhages with greatest involvement of the thalami suggestive of chronic hypertension. Many more microhemorrhages are apparent on today's study compared to the prior MRI due to the inclusion of susceptibility weighted imaging. Patchy and confluent T2 hyperintensities in the cerebral white matter are similar to the prior MRI and nonspecific but compatible with severe chronic small vessel ischemic disease. Milder chronic small vessel changes are present in the pons. A chronic infarct is again seen involving the right corona radiata and basal ganglia, and there also  chronic lacunar infarcts in the thalami. No mass, midline shift, or extra-axial fluid collection is identified. There is moderate cerebral atrophy. Vascular: Major intracranial vascular flow voids are preserved. Skull and upper cervical spine: Unremarkable bone marrow signal. Sinuses/Orbits: Unremarkable orbits. Paranasal sinuses and mastoid air cells are clear. Other: None. IMPRESSION: 1. Acute subcentimeter white matter infarct in the right frontal lobe. 2. Severe chronic small vessel ischemia with chronic lacunar infarcts as above. 3. Numerous chronic microhemorrhages suggesting chronic hypertension. Electronically Signed   By: Sebastian AcheAllen  Grady M.D.   On: 05/13/2019 22:22   Mr Thoracic Spine Wo Contrast  Result Date: 05/14/2019 CLINICAL DATA:  Myelopathy. EXAM: MRI THORACIC SPINE WITHOUT CONTRAST TECHNIQUE: Multiplanar, multisequence MR imaging of the thoracic spine was performed. No intravenous contrast was administered.  COMPARISON:  CT chest 03/26/2019 FINDINGS: Alignment:  Mild curvature convex to the left. Vertebrae: No fracture or significant lesion. Benign hemangioma within the superior T10 vertebral body. Cord: No cord compression or primary cord lesion. Imaging is somewhat degraded by motion. Paraspinal and other soft tissues: Negative Disc levels: No significant disc pathology. Disc bulge at T6-7 without neural compression. Canal and foramina are widely patent throughout the thoracic region. IMPRESSION: No significant thoracic region finding. No evidence of cord compression or primary cord lesion. No spinal stenosis or neural compression. Electronically Signed   By: Paulina FusiMark  Shogry M.D.   On: 05/14/2019 12:16   Mr Lumbar Spine Wo Contrast  Result Date: 05/14/2019 CLINICAL DATA:  Myelopathy EXAM: MRI LUMBAR SPINE WITHOUT CONTRAST TECHNIQUE: Multiplanar, multisequence MR imaging of the lumbar spine was performed. No intravenous contrast was administered. COMPARISON:  Thoracic study same day.  Lumbar MRI  02/17/2018. FINDINGS: Segmentation:  5 lumbar type vertebral bodies. Alignment:  5 mm anterolisthesis L4-5. Vertebrae:  No fracture or primary bone lesion. Conus medullaris and cauda equina: Conus extends to the L1-2 level. Conus and cauda equina appear normal. Paraspinal and other soft tissues: Negative Disc levels: No abnormality of significance at L2-3 or above. No canal or foraminal stenosis. L3-4: Mild bulging of the disc. Facet and ligamentous hypertrophy. Mild narrowing of the lateral recesses and foramina but without definite neural compression. L4-5: 5 mm of anterolisthesis due to chronic facet arthropathy. Bulging of the disc with biforaminal protrusions right more than left. Stenosis of the lateral recesses and neural foramina that could cause neural compression on either or both sides. L5-S1: Mild bulging of the disc. Mild facet osteoarthritis. No canal or foraminal stenosis. Compared to the study of last year, findings appear quite similar. IMPRESSION: L4-5: Multifactorial spinal stenosis that could cause neural compression in the lateral recesses or neural foramina on either or both sides. Advanced facet arthropathy with 5 mm of anterolisthesis. Bulging of the disc with biforaminal protrusions right more than left. L3-4: Disc bulge. Facet hypertrophy. Mild lateral recess and foraminal narrowing but without definite neural compression. L5-S1: Bulging of the disc. Mild facet degeneration. No apparent compressive stenosis. Electronically Signed   By: Paulina FusiMark  Shogry M.D.   On: 05/14/2019 12:21    Subjective:   Patient was seen and examined 06/02/2019, 10:18 PM Patient stable today. No acute distress.  No issues overnight Stable for discharge.  Discharge Exam:    Vitals:   06/01/19 1347 06/01/19 2123 06/02/19 0444 06/02/19 1452  BP: 131/88 128/83 (!) 140/99 129/87  Pulse: (!) 103 (!) 105 (!) 105 95  Resp: 16 18 17 17   Temp: 97.9 F (36.6 C) 98.5 F (36.9 C) 98.4 F (36.9 C) 98.2 F (36.8 C)   TempSrc: Oral Oral Oral Oral  SpO2: 98% 99% 99% 100%  Weight:      Height:        General: Pt lying comfortably in bed & appears in no obvious distress. Cardiovascular: S1 & S2 heard, RRR, S1/S2 +. No murmurs, rubs, gallops or clicks. No JVD or pedal edema. Respiratory: Clear to auscultation without wheezing, rhonchi or crackles. No increased work of breathing. Abdominal:  Non-distended, non-tender & soft. No organomegaly or masses appreciated. Normal bowel sounds heard. CNS: Alert and oriented. No focal deficits. Extremities: no edema, no cyanosis    The results of significant diagnostics from this hospitalization (including imaging, microbiology, ancillary and laboratory) are listed below for reference.      Microbiology:   Recent Results (  from the past 240 hour(s))  SARS CORONAVIRUS 2 Nasal Swab Aptima Multi Swab     Status: None   Collection Time: 05/28/19  8:58 PM   Specimen: Aptima Multi Swab; Nasal Swab  Result Value Ref Range Status   SARS Coronavirus 2 NEGATIVE NEGATIVE Final    Comment: (NOTE) SARS-CoV-2 target nucleic acids are NOT DETECTED. The SARS-CoV-2 RNA is generally detectable in upper and lower respiratory specimens during the acute phase of infection. Negative results do not preclude SARS-CoV-2 infection, do not rule out co-infections with other pathogens, and should not be used as the sole basis for treatment or other patient management decisions. Negative results must be combined with clinical observations, patient history, and epidemiological information. The expected result is Negative. Fact Sheet for Patients: HairSlick.no Fact Sheet for Healthcare Providers: quierodirigir.com This test is not yet approved or cleared by the [place] FDA and  has been authorized for detection and/or diagnosis of SARS-CoV-2 by FDA under an Emergency Use Authorization (EUA). This EUA will remain  in effect  (meaning this test can be used) for the duration of the COVID-19 declaration under Section 56 4(b)(1) of the Act, 21 U.S.C. section 360bbb-3(b)(1), unless the authorization is terminated or revoked sooner. Performed at [facility], [address], [place], [place] [address]   SARS CORONAVIRUS 2 Nasal Swab Aptima Multi Swab     Status: None   Collection Time: 06/01/19  2:14 PM   Specimen: Aptima Multi Swab; Nasal Swab  Result Value Ref Range Status   SARS Coronavirus 2 NEGATIVE NEGATIVE Final    Comment: (NOTE) SARS-CoV-2 target nucleic acids are NOT DETECTED. The SARS-CoV-2 RNA is generally detectable in upper and lower respiratory specimens during the acute phase of infection. Negative results do not preclude SARS-CoV-2 infection, do not rule out co-infections with other pathogens, and should not be used as the sole basis for treatment or other patient management decisions. Negative results must be combined with clinical observations, patient history, and epidemiological information. The expected result is Negative. Fact Sheet for Patients: HairSlick.no Fact Sheet for Healthcare Providers: quierodirigir.com This test is not yet approved or cleared by the [place] FDA and  has been authorized for detection and/or diagnosis of SARS-CoV-2 by FDA under an Emergency Use Authorization (EUA). This EUA will remain  in effect (meaning this test can be used) for the duration of the COVID-19 declaration under Section 56 4(b)(1) of the Act, 21 U.S.C. section 360bbb-3(b)(1), unless the authorization is terminated or revoked sooner. Performed at [facility], [address], [place], [place] [address]      Labs:   CBC: Recent Labs  Lab 05/28/19 1743 05/30/19 0321 05/31/19 0326 06/01/19 0742 06/02/19 0858  WBC 4.9 5.7 5.6 6.1 7.6  NEUTROABS 2.3  --   --   --  4.3  HGB 8.7* 8.4* 8.4* 8.6* 10.0*  HCT 26.9*  25.4* 25.7* 26.2* 30.9*  MCV 97.5 93.7 94.5 94.6 94.8  PLT 271 349 339 367 364   Basic Metabolic Panel: Recent Labs  Lab 05/28/19 1846 05/29/19 0015 05/30/19 0321 05/31/19 0326 06/01/19 0742 06/02/19 0858  NA 133* 136 134* 135 135 134*  K 5.8* 3.9 4.0 4.2 4.2 4.2  CL 99 103 101 104 106 106  CO2 19* 17*  GLUCOSE 89 92 81 82 106* 97  BUN 24* CREATININE 2.26* 2.02* 1.75* 1.61* 1.40* 1.35*  CALCIUM 8.3* 8.2* 8.1* 8.0*  8.4* 8.5*  MG 1.5*  --  1.8  --   --   --   PHOS 3.5  --   --   --   --   --    Liver Function Tests: Recent Labs  Lab 05/28/19 1846 06/02/19 0858  AST 37 15  ALT 12 10  ALKPHOS 89 90  BILITOT 1.2 0.6  PROT 6.2* 5.8*  ALBUMIN 2.4* 2.3*   BNP (last 3 results) No results for input(s): BNP in the last 8760 hours. Cardiac Enzymes: No results for input(s): CKTOTAL, CKMB, CKMBINDEX, TROPONINI in the last 168 hours. CBG: Recent Labs  Lab 05/28/19 1647  GLUCAP 89  Urinalysis    Component Value Date/Time   COLORURINE YELLOW 05/28/2019 2137   APPEARANCEUR CLEAR 05/28/2019 2137   LABSPEC 1.008 05/28/2019 2137   PHURINE 6.0 05/28/2019 2137   GLUCOSEU 50 (A) 05/28/2019 2137   HGBUR MODERATE (A) 05/28/2019 2137   BILIRUBINUR NEGATIVE 05/28/2019 2137   KETONESUR NEGATIVE 05/28/2019 2137   PROTEINUR NEGATIVE 05/28/2019 2137   UROBILINOGEN 1.0 06/24/2010 1850   NITRITE NEGATIVE 05/28/2019 2137   LEUKOCYTESUR NEGATIVE 05/28/2019 2137    Time coordinating discharge: Over 40 minutes  SIGNED: Kendell Bane, MD, FACP, [facility]. [facility],  Pager [phone]  If 7PM-7AM, please contact night-coverage [email or web address] 06/02/2019, 10:18 PM

## 2019-06-02 NOTE — TOC Transition Note (Signed)
Transition of Care [facility]) - CM/SW Discharge Note   Patient Details  Name: Michelle Graves MRN: [id number] Date of Birth: 1951-10-26  Transition of Care [facility]) CM/SW Contact:  Alexander Mt, [place] Phone Number: 06/02/2019, 1:35 PM   Clinical Narrative:    Pt stable for discharge, paperwork completed and sent to [facility].  Pt daughter aware of plan and pick up time. Pt does not have any controlled substances noted by this writer on her discharge summary.   PTAR papers sent to floor for pt.    Final next level of care: Skilled Nursing Facility Barriers to Discharge: Ship broker, Continued Medical Work up   Patient Goals and CMS Choice Patient states their goals for this hospitalization and ongoing recovery are:: Pt is agreeable to rehab. CMS Medicare.gov Compare Post Acute Care list provided to:: Patient Choice offered to / list presented to : Patient  Discharge Placement   Existing PASRR number confirmed : 05/30/19          Patient chooses bed at: [facility] Patient to be transferred to facility by: [facility] Name of family member notified: pt daughter [name] Patient and family notified of of transfer: 06/02/19  Discharge Plan and Services In-house Referral: Clinical Social Work Discharge Planning Services: NA Post Acute Care Choice: [place]          DME Arranged: N/A DME Agency: NA       HH Arranged: NA [place] Agency: NA        Social Determinants of Health (SDOH) Interventions     Readmission Risk Interventions Readmission Risk Prevention Plan 05/31/2019  Transportation Screening Complete  PCP or Specialist Appt within 3-5 Days Not Complete  Not Complete comments plan for SNF  HRI or [place] Not Complete  HRI or Home Care Consult comments plan for SNF  Social Work Consult for [place] Planning/Counseling Complete  Palliative Care Screening Not Applicable  Medication Review Press photographer) Complete  Some recent data  might be hidden

## 2019-06-02 NOTE — Progress Notes (Signed)
Report given to Lezlie Lye, RN at [place] and rehab

## 2019-06-02 NOTE — Plan of Care (Signed)
  Problem: Coping: Goal: Level of anxiety will decrease Outcome: Progressing   Problem: Pain Managment: Goal: General experience of comfort will improve Outcome: Progressing   

## 2019-06-02 NOTE — Progress Notes (Signed)
Transported to [place] by [employer]

## 2019-06-16 ENCOUNTER — Emergency Department (HOSPITAL_COMMUNITY): Payer: Medicare Other

## 2019-06-16 ENCOUNTER — Encounter (HOSPITAL_COMMUNITY): Payer: Self-pay | Admitting: Emergency Medicine

## 2019-06-16 ENCOUNTER — Emergency Department (HOSPITAL_COMMUNITY)
Admission: EM | Admit: 2019-06-16 | Discharge: 2019-06-17 | Disposition: A | Discharge status: Home / Self Care | Payer: Medicare Other | Attending: Emergency Medicine | Admitting: Emergency Medicine

## 2019-06-16 ENCOUNTER — Other Ambulatory Visit: Payer: Self-pay

## 2019-06-16 DIAGNOSIS — Z20828 Contact with and (suspected) exposure to other viral communicable diseases: Secondary | ICD-10-CM | POA: Diagnosis not present

## 2019-06-16 DIAGNOSIS — R531 Weakness: Secondary | ICD-10-CM

## 2019-06-16 DIAGNOSIS — R4182 Altered mental status, unspecified: Secondary | ICD-10-CM | POA: Insufficient documentation

## 2019-06-16 DIAGNOSIS — I4891 Unspecified atrial fibrillation: Secondary | ICD-10-CM | POA: Insufficient documentation

## 2019-06-16 DIAGNOSIS — I10 Essential (primary) hypertension: Secondary | ICD-10-CM | POA: Diagnosis not present

## 2019-06-16 DIAGNOSIS — Z7901 Long term (current) use of anticoagulants: Secondary | ICD-10-CM | POA: Insufficient documentation

## 2019-06-16 DIAGNOSIS — Z7982 Long term (current) use of aspirin: Secondary | ICD-10-CM | POA: Insufficient documentation

## 2019-06-16 DIAGNOSIS — N289 Disorder of kidney and ureter, unspecified: Secondary | ICD-10-CM | POA: Diagnosis not present

## 2019-06-16 LAB — COMPREHENSIVE METABOLIC PANEL
ALT: 15 U/L (ref 0–44)
AST: 28 U/L (ref 15–41)
Albumin: 2.9 g/dL — ABNORMAL LOW (ref 3.5–5.0)
Alkaline Phosphatase: 75 U/L (ref 38–126)
Anion gap: 13 (ref 5–15)
BUN: 16 mg/dL (ref 8–23)
CO2: 25 mmol/L (ref 22–32)
Calcium: 9 mg/dL (ref 8.9–10.3)
Chloride: 102 mmol/L (ref 98–111)
Creatinine, Ser: 1.91 mg/dL — ABNORMAL HIGH (ref 0.44–1.00)
GFR calc Af Amer: 31 mL/min — ABNORMAL LOW (ref >60)
GFR calc non Af Amer: 27 mL/min — ABNORMAL LOW (ref >60)
Glucose, Bld: 104 mg/dL — ABNORMAL HIGH (ref 70–99)
Potassium: 3.9 mmol/L (ref 3.5–5.1)
Sodium: 140 mmol/L (ref 135–145)
Total Bilirubin: 0.7 mg/dL (ref 0.3–1.2)
Total Protein: 6.3 g/dL — ABNORMAL LOW (ref 6.5–8.1)

## 2019-06-16 LAB — CBC WITH DIFFERENTIAL/PLATELET
Abs Immature Granulocytes: 0.02 10*3/uL (ref 0.00–0.07)
Basophils Absolute: 0.1 10*3/uL (ref 0.0–0.1)
Basophils Relative: 1 %
Eosinophils Absolute: 0 10*3/uL (ref 0.0–0.5)
Eosinophils Relative: 0 %
HCT: 30.2 % — ABNORMAL LOW (ref 36.0–46.0)
Hemoglobin: 9.8 g/dL — ABNORMAL LOW (ref 12.0–15.0)
Immature Granulocytes: 0 %
Lymphocytes Relative: 42 %
Lymphs Abs: 3.3 10*3/uL (ref 0.7–4.0)
MCH: 31.1 pg (ref 26.0–34.0)
MCHC: 32.5 g/dL (ref 30.0–36.0)
MCV: 95.9 fL (ref 80.0–100.0)
Monocytes Absolute: 1 10*3/uL (ref 0.1–1.0)
Monocytes Relative: 12 %
Neutro Abs: 3.5 10*3/uL (ref 1.7–7.7)
Neutrophils Relative %: 45 %
Platelets: 301 10*3/uL (ref 150–400)
RBC: 3.15 MIL/uL — ABNORMAL LOW (ref 3.87–5.11)
RDW: 18.3 % — ABNORMAL HIGH (ref 11.5–15.5)
WBC: 7.9 10*3/uL (ref 4.0–10.5)
nRBC: 0 % (ref 0.0–0.2)

## 2019-06-16 MED ORDER — SODIUM CHLORIDE 0.9 % IV SOLN
INTRAVENOUS | Status: DC
  Administered 2019-06-16: 22:00:00 via INTRAVENOUS

## 2019-06-16 MED ORDER — SODIUM CHLORIDE 0.9 % IV BOLUS
250.0000 mL | Freq: Once | INTRAVENOUS | Status: AC
  Administered 2019-06-16: 250 mL via INTRAVENOUS

## 2019-06-16 NOTE — ED Notes (Signed)
Patient transported to CT 

## 2019-06-16 NOTE — ED Notes (Signed)
Purple top blood specimen recollected and sent to lab .

## 2019-06-16 NOTE — Progress Notes (Signed)
Pt was assessed earlier with ultrasound both arms showing no to very poor venous access.  Assessed for midline both upper arms.  Veins suitable for midline being no basilic or cephalic vein noted on either side.  Lt brachial too small and right brachial difficult to access close to artery.   Right AC PIV started and now has infiltrated.  Recommend consideration of other type of access such as central line if farther IV access needed.

## 2019-06-16 NOTE — ED Notes (Signed)
Patient daughter calling asking for a call back, hasn't heard anything on patient since she got here.  [name] [phone]

## 2019-06-16 NOTE — ED Provider Notes (Signed)
[facility] EMERGENCY DEPARTMENT Provider Note   CSN: [id number] Arrival date & time: 06/16/19  1947     History   Chief Complaint Chief Complaint  Patient presents with  . Generalized Weakness    HPI Michelle Graves is a 68 y.o. female.     Patient sent in from [place] facility by EMS.  Staff there reported progressive generalized weakness and somnolence this week.  Blood sugar by EMS was 156.  Patient denied any fevers any upper respiratory symptoms any pain shortness of breath.  Just felt that she felt fatigued.  Upon arrival she was alert and would answer questions and follow commands.  Patient was also brought in on August 1 for similar symptoms the complaint then was generalized weakness for 10 days.  Patient was admitted for colitis and CVA.  They reported then the patient has left-sided weakness at baseline.  However patient has good movement of both lower extremities.  And upper extremities.       Past Medical History:  Diagnosis Date  . Arthritis   . Chest pain   . Diverticulitis   . Hypertension   . Stroke [facility])     Patient Active Problem List   Diagnosis Date Noted  . AKI (acute kidney injury) ([place]) 05/28/2019  . Nausea & vomiting 05/28/2019  . 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 Anemia, chronic disease 05/28/2019  . Swelling   . Small vessel disease, cerebrovascular   . Acute ischemic stroke ([place])   . General weakness   . Acute left ankle pain   . Acute on chronic renal failure ([place]) 05/13/2019  . Weakness of both lower extremities 05/13/2019  . Cerebral thrombosis with cerebral infarction 03/31/2019  . New onset a-fib ([place]) 03/27/2019  . Acute kidney injury superimposed on CKD ([place]) 03/24/2019  . Hypotension 03/24/2019  . Dark emesis 03/24/2019  . Dark stools 03/24/2019  . Diverticulitis of intestine without perforation or abscess without bleeding   . Sinus tachycardia 08/27/2016  . Diverticulitis 08/26/2016  .  HYPERTENSION, MALIGNANT ESSENTIAL 06/24/2007  . KIDNEY DISEASE, CHRONIC, STAGE III 06/24/2007  . ARTHRITIS, RHEUMATOID, SEROPOSITIVE 06/24/2007    Past Surgical History:  Procedure Laterality Date  . BIOPSY  04/01/2019   Procedure: BIOPSY;  Surgeon: Otis Brace, MD;  Location: [facility];  Service: Gastroenterology;;  . COLONOSCOPY WITH PROPOFOL N/A 04/01/2019   Procedure: COLONOSCOPY WITH PROPOFOL;  Surgeon: Otis Brace, MD;  Location: [facility];  Service: Gastroenterology;  Laterality: N/A;  . ESOPHAGOGASTRODUODENOSCOPY (EGD) WITH PROPOFOL N/A 04/01/2019   Procedure: ESOPHAGOGASTRODUODENOSCOPY (EGD) WITH PROPOFOL;  Surgeon: Otis Brace, MD;  Location: [facility];  Service: Gastroenterology;  Laterality: N/A;  . TONSILLECTOMY       OB History   No obstetric history on file.      Home Medications    Prior to Admission medications   Medication Sig Start Date End Date Taking? Authorizing Provider  aspirin EC 81 MG EC tablet Take 1 tablet (81 mg total) by mouth daily. 05/16/19   Jose Persia, MD  calcium carbonate (OSCAL) 1500 (600 Ca) MG TABS tablet Take 600 mg of elemental calcium by mouth daily.    [provider]  clopidogrel (PLAVIX) 75 MG tablet Take 1 tablet (75 mg total) by mouth daily. 04/05/19   Aline August, MD  colestipol (COLESTID) 1 g tablet Take 1 tablet (1 g total) by mouth 2 (two) times daily. 05/16/19   Jose Persia, MD  docusate sodium (COLACE) 100 MG  capsule Take 1 capsule (100 mg total) by mouth 2 (two) times daily. 06/02/19   Shahmehdi, Gemma PayorSeyed A, MD  DULoxetine (CYMBALTA) 60 MG capsule Take 60 mg by mouth daily.    [provider]  feeding supplement, ENSURE ENLIVE, (ENSURE ENLIVE) LIQD Take 237 mLs by mouth 3 (three) times daily between meals. 06/02/19   Shahmehdi, Gemma PayorSeyed A, MD  furosemide (LASIX) 40 MG tablet Take 1 tablet (40 mg total) by mouth daily. 04/05/19   Glade LloydAlekh, Kshitiz, MD  hydroxychloroquine (PLAQUENIL) 200 MG tablet  Take 200 mg by mouth 2 (two) times daily.  06/16/16   [provider]  megestrol (MEGACE) 400 MG/10ML suspension Take 10 mLs (400 mg total) by mouth 2 (two) times daily. 06/02/19   Shahmehdi, Gemma PayorSeyed A, MD  metoprolol tartrate (LOPRESSOR) 100 MG tablet Take 100 mg by mouth 2 (two) times daily.  11/19/15   [provider]  Misc Natural Products (OSTEO BI-FLEX JOINT SHIELD PO) Take 1 tablet by mouth daily.    [provider]  Multiple Vitamin (MULTIVITAMIN WITH MINERALS) TABS tablet Take 1 tablet by mouth daily. 06/03/19   Shahmehdi, Gemma PayorSeyed A, MD  ondansetron (ZOFRAN) 4 MG tablet Take 1 tablet (4 mg total) by mouth every 6 (six) hours as needed for nausea. 04/04/19   Glade LloydAlekh, Kshitiz, MD  pantoprazole (PROTONIX) 40 MG tablet Take 1 tablet (40 mg total) by mouth 2 (two) times daily. Patient taking differently: Take 40 mg by mouth 2 (two) times daily as needed (for reflux).  04/04/19   Glade LloydAlekh, Kshitiz, MD  terbinafine (LAMISIL) 250 MG tablet Take 250 mg by mouth daily. 03/15/18   [provider]  traZODone (DESYREL) 100 MG tablet Take 100 mg by mouth at bedtime.  03/01/19   [provider]    Family History Family History  Problem Relation Age of Onset  . Hypertension Mother   . Diabetes Mother   . CAD Father        died of MI at age 66  . Hypertension Father   . Diabetes Sister   . Diabetes Sister   . Kidney disease Neg Hx     Social History Social History   Tobacco Use  . Smoking status: Never Smoker  . Smokeless tobacco: Never Used  Substance Use Topics  . Alcohol use: Yes    Alcohol/week: 21.0 standard drinks    Types: 21 Glasses of wine per week    Comment: sometimes   . Drug use: No     Allergies   Patient has no known allergies.   Review of Systems Review of Systems  Constitutional: Positive for fatigue. Negative for chills and fever.  HENT: Negative for congestion, rhinorrhea and sore throat.   Eyes: Negative for visual disturbance.   Respiratory: Negative for cough and shortness of breath.   Cardiovascular: Negative for chest pain and leg swelling.  Gastrointestinal: Negative for abdominal pain, diarrhea, nausea and vomiting.  Genitourinary: Negative for dysuria.  Musculoskeletal: Negative for back pain and neck pain.  Skin: Negative for rash.  Neurological: Positive for weakness. Negative for dizziness, light-headedness and headaches.  Hematological: Does not bruise/bleed easily.  Psychiatric/Behavioral: Negative for confusion.     Physical Exam Updated Vital Signs BP (!) 149/103 (BP Location: Right Arm)   Pulse 87   Temp 98.1 F (36.7 C) (Oral)   Resp 18   SpO2 99%   Physical Exam Vitals signs and nursing note reviewed.  Constitutional:      General: She is  not in acute distress.    Appearance: Normal appearance. She is well-developed.  HENT:     Head: Normocephalic and atraumatic.  Eyes:     Extraocular Movements: Extraocular movements intact.     Conjunctiva/sclera: Conjunctivae normal.     Pupils: Pupils are equal, round, and reactive to light.  Neck:     Musculoskeletal: Normal range of motion and neck supple.  Cardiovascular:     Rate and Rhythm: Normal rate and regular rhythm.     Heart sounds: No murmur.  Pulmonary:     Effort: Pulmonary effort is normal. No respiratory distress.     Breath sounds: Normal breath sounds.  Abdominal:     General: Bowel sounds are normal.     Palpations: Abdomen is soft.     Tenderness: There is no abdominal tenderness.  Musculoskeletal: Normal range of motion.        General: No swelling.  Skin:    General: Skin is warm and dry.     Capillary Refill: Capillary refill takes less than 2 seconds.  Neurological:     Mental Status: She is alert.     Cranial Nerves: No cranial nerve deficit.     Motor: No weakness.     Comments: Patient alert and able to follow commands.  No significant weakness.  Some generalized weakness upper extremities and lower  extremities.  But the no evidence of any unilateral weakness being worse.      ED Treatments / Results  Labs (all labs ordered are listed, but only abnormal results are displayed) Labs Reviewed  COMPREHENSIVE METABOLIC PANEL - Abnormal; Notable for the following components:      Result Value   Glucose, Bld 104 (*)    Creatinine, Ser 1.91 (*)    Total Protein 6.3 (*)    Albumin 2.9 (*)    GFR calc non Af Amer 27 (*)    GFR calc Af Amer 31 (*)    All other components within normal limits  CBC WITH DIFFERENTIAL/PLATELET - Abnormal; Notable for the following components:   RBC 3.15 (*)    Hemoglobin 9.8 (*)    HCT 30.2 (*)    RDW 18.3 (*)    All other components within normal limits  CBC WITH DIFFERENTIAL/PLATELET  URINALYSIS, ROUTINE W REFLEX MICROSCOPIC    EKG EKG Interpretation  Date/Time:  Thursday June 16 2019 19:58:18 EDT Ventricular Rate:  93 PR Interval:    QRS Duration: 99 QT Interval:  446 QTC Calculation: 555 R Axis:   -5 Text Interpretation:  Sinus rhythm Abnormal R-wave progression, early transition Borderline T abnormalities, diffuse leads Prolonged QT interval Baseline wander in lead(s) II aVR aVF V1 V3 V6 Confirmed by Vanetta Mulders [phone]) on 06/16/2019 8:16:21 PM   Radiology No results found.  Procedures Procedures (including critical care time)  Medications Ordered in ED Medications  0.9 %  sodium chloride infusion (has no administration in time range)  sodium chloride 0.9 % bolus 250 mL (has no administration in time range)     Initial Impression / Assessment and Plan / ED Course  I have reviewed the triage vital signs and the nursing notes.  Pertinent labs & imaging results that were available during my care of the patient were reviewed by me and considered in my medical decision making (see chart for details).        Patient work-up here head CT without any acute findings.  Chest x-ray negative for any evidence of pneumonia.  Labs  without significant abnormalities other than increase in the creatinine to 1.91.  This is a change from just the beginning of August.  However BUN is unchanged.  GFR though is still in the 30s.  Urinalysis pending.  Also when had did COVID testing since patient is from nursing facility.  Although does not have any distinct upper respiratory symptoms.  Disposition based on urinalysis and COVID testing.  Final Clinical Impressions(s) / ED Diagnoses   Final diagnoses:  AKI (acute kidney injury) (HCC)  Weakness  Altered mental status, unspecified altered mental status type    ED Discharge Orders    None       Vanetta Mulders, MD 06/16/19 2318

## 2019-06-16 NOTE — ED Notes (Signed)
Unable to access peripheral IV multiple times , IV team consult ordered .  

## 2019-06-16 NOTE — ED Notes (Signed)
Daughter [name]) notified on pt.'s condition /plan of care .

## 2019-06-16 NOTE — ED Triage Notes (Signed)
Patient arrived with EMS from [facility] home , staff reported progressing generalized weakness / somnolence this week , CBG= 156 by EMS , alert and oriented at arrival ,denies pain /respirations unlabored .

## 2019-06-16 NOTE — ED Notes (Signed)
Dr. Venita Sheffield ( EDP) notified that IV nurse will not attempt to reaccess peripheral IV  .

## 2019-06-17 LAB — URINALYSIS, ROUTINE W REFLEX MICROSCOPIC
Bacteria, UA: NONE SEEN
Bilirubin Urine: NEGATIVE
Glucose, UA: NEGATIVE mg/dL
Hgb urine dipstick: NEGATIVE
Ketones, ur: 5 mg/dL — AB
Leukocytes,Ua: NEGATIVE
Nitrite: NEGATIVE
Protein, ur: 100 mg/dL — AB
Specific Gravity, Urine: 1.023 (ref 1.005–1.030)
pH: 6 (ref 5.0–8.0)

## 2019-06-17 LAB — SARS CORONAVIRUS 2 BY RT PCR (HOSPITAL ORDER, PERFORMED IN ~~LOC~~ HOSPITAL LAB): SARS Coronavirus 2: NEGATIVE

## 2019-06-17 NOTE — ED Notes (Signed)
[name], pt's daughter, has been updated and discharge instructions dicussed with daughter.

## 2019-06-17 NOTE — ED Notes (Signed)
Pt's daughter, [name], updated on pt's discharge back to [facility].

## 2019-06-17 NOTE — ED Notes (Signed)
[facility] called and informed of Discharge instructions and PTAR taking pt to facility at this time

## 2019-06-17 NOTE — ED Notes (Signed)
PTAR Called 

## 2019-06-27 ENCOUNTER — Telehealth: Payer: Self-pay | Admitting: Adult Health

## 2019-08-24 ENCOUNTER — Other Ambulatory Visit: Payer: Self-pay | Admitting: Physician Assistant

## 2019-08-24 DIAGNOSIS — Z1231 Encounter for screening mammogram for malignant neoplasm of breast: Secondary | ICD-10-CM

## 2019-08-24 DIAGNOSIS — E2839 Other primary ovarian failure: Secondary | ICD-10-CM

## 2019-09-30 ENCOUNTER — Inpatient Hospital Stay (HOSPITAL_COMMUNITY): Payer: Medicare Other

## 2019-09-30 ENCOUNTER — Inpatient Hospital Stay (HOSPITAL_COMMUNITY)
Admission: EM | Admit: 2019-09-30 | Discharge: 2019-10-06 | DRG: 394 | Disposition: A | Discharge status: Home Health | Payer: Medicare Other | Attending: Student in an Organized Health Care Education/Training Program | Admitting: Student in an Organized Health Care Education/Training Program

## 2019-09-30 ENCOUNTER — Other Ambulatory Visit: Payer: Self-pay

## 2019-09-30 ENCOUNTER — Encounter (HOSPITAL_COMMUNITY): Payer: Self-pay | Admitting: Emergency Medicine

## 2019-09-30 ENCOUNTER — Emergency Department (HOSPITAL_COMMUNITY): Payer: Medicare Other

## 2019-09-30 DIAGNOSIS — N183 Chronic kidney disease, stage 3 unspecified: Secondary | ICD-10-CM | POA: Diagnosis present

## 2019-09-30 DIAGNOSIS — N179 Acute kidney failure, unspecified: Secondary | ICD-10-CM | POA: Diagnosis present

## 2019-09-30 DIAGNOSIS — G8929 Other chronic pain: Secondary | ICD-10-CM | POA: Diagnosis present

## 2019-09-30 DIAGNOSIS — M069 Rheumatoid arthritis, unspecified: Secondary | ICD-10-CM | POA: Diagnosis present

## 2019-09-30 DIAGNOSIS — K648 Other hemorrhoids: Secondary | ICD-10-CM | POA: Diagnosis present

## 2019-09-30 DIAGNOSIS — E440 Moderate protein-calorie malnutrition: Secondary | ICD-10-CM | POA: Diagnosis present

## 2019-09-30 DIAGNOSIS — D638 Anemia in other chronic diseases classified elsewhere: Secondary | ICD-10-CM | POA: Diagnosis present

## 2019-09-30 DIAGNOSIS — Z7982 Long term (current) use of aspirin: Secondary | ICD-10-CM

## 2019-09-30 DIAGNOSIS — Z20828 Contact with and (suspected) exposure to other viral communicable diseases: Secondary | ICD-10-CM | POA: Diagnosis present

## 2019-09-30 DIAGNOSIS — K319 Disease of stomach and duodenum, unspecified: Secondary | ICD-10-CM | POA: Diagnosis present

## 2019-09-30 DIAGNOSIS — Z7901 Long term (current) use of anticoagulants: Secondary | ICD-10-CM

## 2019-09-30 DIAGNOSIS — Z8673 Personal history of transient ischemic attack (TIA), and cerebral infarction without residual deficits: Secondary | ICD-10-CM | POA: Diagnosis not present

## 2019-09-30 DIAGNOSIS — E860 Dehydration: Secondary | ICD-10-CM | POA: Diagnosis present

## 2019-09-30 DIAGNOSIS — E785 Hyperlipidemia, unspecified: Secondary | ICD-10-CM | POA: Diagnosis present

## 2019-09-30 DIAGNOSIS — Z6826 Body mass index (BMI) 26.0-26.9, adult: Secondary | ICD-10-CM

## 2019-09-30 DIAGNOSIS — K222 Esophageal obstruction: Secondary | ICD-10-CM | POA: Diagnosis present

## 2019-09-30 DIAGNOSIS — K921 Melena: Secondary | ICD-10-CM | POA: Diagnosis present

## 2019-09-30 DIAGNOSIS — E872 Acidosis: Secondary | ICD-10-CM | POA: Diagnosis present

## 2019-09-30 DIAGNOSIS — K259 Gastric ulcer, unspecified as acute or chronic, without hemorrhage or perforation: Secondary | ICD-10-CM | POA: Diagnosis present

## 2019-09-30 DIAGNOSIS — K5900 Constipation, unspecified: Secondary | ICD-10-CM | POA: Diagnosis present

## 2019-09-30 DIAGNOSIS — E876 Hypokalemia: Secondary | ICD-10-CM | POA: Diagnosis present

## 2019-09-30 DIAGNOSIS — M5136 Other intervertebral disc degeneration, lumbar region: Secondary | ICD-10-CM | POA: Diagnosis present

## 2019-09-30 DIAGNOSIS — Z7902 Long term (current) use of antithrombotics/antiplatelets: Secondary | ICD-10-CM | POA: Diagnosis not present

## 2019-09-30 DIAGNOSIS — Z833 Family history of diabetes mellitus: Secondary | ICD-10-CM

## 2019-09-30 DIAGNOSIS — D6489 Other specified anemias: Secondary | ICD-10-CM | POA: Diagnosis present

## 2019-09-30 DIAGNOSIS — I129 Hypertensive chronic kidney disease with stage 1 through stage 4 chronic kidney disease, or unspecified chronic kidney disease: Secondary | ICD-10-CM | POA: Diagnosis present

## 2019-09-30 DIAGNOSIS — K449 Diaphragmatic hernia without obstruction or gangrene: Secondary | ICD-10-CM | POA: Diagnosis present

## 2019-09-30 DIAGNOSIS — K529 Noninfective gastroenteritis and colitis, unspecified: Secondary | ICD-10-CM | POA: Diagnosis present

## 2019-09-30 DIAGNOSIS — N390 Urinary tract infection, site not specified: Secondary | ICD-10-CM | POA: Diagnosis present

## 2019-09-30 DIAGNOSIS — Z6827 Body mass index (BMI) 27.0-27.9, adult: Secondary | ICD-10-CM | POA: Diagnosis not present

## 2019-09-30 DIAGNOSIS — G4700 Insomnia, unspecified: Secondary | ICD-10-CM | POA: Diagnosis present

## 2019-09-30 DIAGNOSIS — Z8249 Family history of ischemic heart disease and other diseases of the circulatory system: Secondary | ICD-10-CM

## 2019-09-30 DIAGNOSIS — I69344 Monoplegia of lower limb following cerebral infarction affecting left non-dominant side: Secondary | ICD-10-CM

## 2019-09-30 DIAGNOSIS — Z79899 Other long term (current) drug therapy: Secondary | ICD-10-CM

## 2019-09-30 DIAGNOSIS — E878 Other disorders of electrolyte and fluid balance, not elsewhere classified: Secondary | ICD-10-CM | POA: Diagnosis present

## 2019-09-30 DIAGNOSIS — B9620 Unspecified Escherichia coli [E. coli] as the cause of diseases classified elsewhere: Secondary | ICD-10-CM | POA: Diagnosis not present

## 2019-09-30 DIAGNOSIS — K551 Chronic vascular disorders of intestine: Secondary | ICD-10-CM | POA: Diagnosis present

## 2019-09-30 DIAGNOSIS — M549 Dorsalgia, unspecified: Secondary | ICD-10-CM | POA: Diagnosis not present

## 2019-09-30 DIAGNOSIS — D631 Anemia in chronic kidney disease: Secondary | ICD-10-CM | POA: Diagnosis not present

## 2019-09-30 DIAGNOSIS — D649 Anemia, unspecified: Secondary | ICD-10-CM | POA: Diagnosis not present

## 2019-09-30 LAB — TYPE AND SCREEN
ABO/RH(D): O POS
Antibody Screen: NEGATIVE

## 2019-09-30 LAB — POC OCCULT BLOOD, ED: Fecal Occult Bld: POSITIVE — AB

## 2019-09-30 LAB — CBC WITH DIFFERENTIAL/PLATELET
Abs Immature Granulocytes: 0.04 10*3/uL (ref 0.00–0.07)
Basophils Absolute: 0 10*3/uL (ref 0.0–0.1)
Basophils Relative: 0 %
Eosinophils Absolute: 0.3 10*3/uL (ref 0.0–0.5)
Eosinophils Relative: 3 %
HCT: 29.9 % — ABNORMAL LOW (ref 36.0–46.0)
Hemoglobin: 9.8 g/dL — ABNORMAL LOW (ref 12.0–15.0)
Immature Granulocytes: 1 %
Lymphocytes Relative: 27 %
Lymphs Abs: 2.2 10*3/uL (ref 0.7–4.0)
MCH: 30.7 pg (ref 26.0–34.0)
MCHC: 32.8 g/dL (ref 30.0–36.0)
MCV: 93.7 fL (ref 80.0–100.0)
Monocytes Absolute: 0.8 10*3/uL (ref 0.1–1.0)
Monocytes Relative: 10 %
Neutro Abs: 4.9 10*3/uL (ref 1.7–7.7)
Neutrophils Relative %: 59 %
Platelets: 227 10*3/uL (ref 150–400)
RBC: 3.19 MIL/uL — ABNORMAL LOW (ref 3.87–5.11)
RDW: 12.9 % (ref 11.5–15.5)
WBC: 8.3 10*3/uL (ref 4.0–10.5)
nRBC: 0 % (ref 0.0–0.2)

## 2019-09-30 LAB — COMPREHENSIVE METABOLIC PANEL
ALT: 10 U/L (ref 0–44)
AST: 14 U/L — ABNORMAL LOW (ref 15–41)
Albumin: 3.7 g/dL (ref 3.5–5.0)
Alkaline Phosphatase: 57 U/L (ref 38–126)
Anion gap: 12 (ref 5–15)
BUN: 26 mg/dL — ABNORMAL HIGH (ref 8–23)
CO2: 24 mmol/L (ref 22–32)
Calcium: 9.8 mg/dL (ref 8.9–10.3)
Chloride: 104 mmol/L (ref 98–111)
Creatinine, Ser: 2.74 mg/dL — ABNORMAL HIGH (ref 0.44–1.00)
GFR calc Af Amer: 20 mL/min — ABNORMAL LOW (ref >60)
GFR calc non Af Amer: 17 mL/min — ABNORMAL LOW (ref >60)
Glucose, Bld: 106 mg/dL — ABNORMAL HIGH (ref 70–99)
Potassium: 4 mmol/L (ref 3.5–5.1)
Sodium: 140 mmol/L (ref 135–145)
Total Bilirubin: 0.8 mg/dL (ref 0.3–1.2)
Total Protein: 7.1 g/dL (ref 6.5–8.1)

## 2019-09-30 LAB — URINALYSIS, ROUTINE W REFLEX MICROSCOPIC
Bilirubin Urine: NEGATIVE
Glucose, UA: NEGATIVE mg/dL
Hgb urine dipstick: NEGATIVE
Ketones, ur: NEGATIVE mg/dL
Nitrite: NEGATIVE
Protein, ur: 30 mg/dL — AB
Specific Gravity, Urine: 1.013 (ref 1.005–1.030)
pH: 6 (ref 5.0–8.0)

## 2019-09-30 LAB — LACTIC ACID, PLASMA: Lactic Acid, Venous: 1.6 mmol/L (ref 0.5–1.9)

## 2019-09-30 LAB — LIPASE, BLOOD: Lipase: 18 U/L (ref 11–51)

## 2019-09-30 MED ORDER — ENSURE ENLIVE PO LIQD
237.0000 mL | Freq: Three times a day (TID) | ORAL | Status: DC
  Filled 2019-09-30: qty 237

## 2019-09-30 MED ORDER — ACETAMINOPHEN 325 MG PO TABS: 650.0000 mg | ORAL_TABLET | Freq: Four times a day (QID) | ORAL | Status: DC | PRN

## 2019-09-30 MED ORDER — METRONIDAZOLE IN NACL 5-0.79 MG/ML-% IV SOLN: 500.0000 mg | Freq: Three times a day (TID) | INTRAVENOUS | Status: DC

## 2019-09-30 MED ORDER — SODIUM CHLORIDE 0.9% FLUSH: 3.0000 mL | Freq: Once | INTRAVENOUS | Status: DC

## 2019-09-30 MED ORDER — MEGESTROL ACETATE 400 MG/10ML PO SUSP
400.0000 mg | Freq: Two times a day (BID) | ORAL | Status: DC
  Administered 2019-10-01 (×2): 400 mg via ORAL
  Filled 2019-09-30 (×3): qty 10

## 2019-09-30 MED ORDER — SODIUM CHLORIDE 0.9 % IV SOLN: INTRAVENOUS | Status: DC

## 2019-09-30 MED ORDER — DOXEPIN HCL 10 MG PO CAPS
10.0000 mg | ORAL_CAPSULE | Freq: Every day | ORAL | Status: DC
  Administered 2019-10-01 – 2019-10-05 (×6): 10 mg via ORAL
  Filled 2019-09-30 (×6): qty 1

## 2019-09-30 MED ORDER — ONDANSETRON HCL 4 MG PO TABS: 4.0000 mg | ORAL_TABLET | Freq: Four times a day (QID) | ORAL | Status: DC | PRN

## 2019-09-30 MED ORDER — SODIUM CHLORIDE 0.9 % IV BOLUS
1000.0000 mL | Freq: Once | INTRAVENOUS | Status: AC
  Administered 2019-09-30: 1000 mL via INTRAVENOUS

## 2019-09-30 MED ORDER — DULOXETINE HCL 60 MG PO CPEP
60.0000 mg | ORAL_CAPSULE | Freq: Every day | ORAL | Status: DC
  Administered 2019-10-01: 60 mg via ORAL
  Filled 2019-09-30: qty 1

## 2019-09-30 MED ORDER — ONDANSETRON HCL 4 MG/2ML IJ SOLN
4.0000 mg | Freq: Four times a day (QID) | INTRAMUSCULAR | Status: DC | PRN
  Administered 2019-10-03 – 2019-10-04 (×3): 4 mg via INTRAVENOUS
  Filled 2019-09-30 (×3): qty 2

## 2019-09-30 MED ORDER — SODIUM CHLORIDE 0.9 % IV SOLN
1.0000 g | Freq: Once | INTRAVENOUS | Status: AC
  Administered 2019-09-30: 1 g via INTRAVENOUS
  Filled 2019-09-30: qty 10

## 2019-09-30 MED ORDER — PANTOPRAZOLE SODIUM 40 MG PO TBEC
40.0000 mg | DELAYED_RELEASE_TABLET | Freq: Every day | ORAL | Status: DC
  Administered 2019-10-01 – 2019-10-06 (×6): 40 mg via ORAL
  Filled 2019-09-30 (×6): qty 1

## 2019-09-30 MED ORDER — HYDROXYCHLOROQUINE SULFATE 200 MG PO TABS
200.0000 mg | ORAL_TABLET | Freq: Two times a day (BID) | ORAL | Status: DC
  Administered 2019-10-01 – 2019-10-06 (×11): 200 mg via ORAL
  Filled 2019-09-30 (×11): qty 1

## 2019-09-30 MED ORDER — SENNOSIDES-DOCUSATE SODIUM 8.6-50 MG PO TABS: 1.0000 | ORAL_TABLET | Freq: Every evening | ORAL | Status: DC | PRN

## 2019-09-30 MED ORDER — COLESTIPOL HCL 1 G PO TABS
1.0000 g | ORAL_TABLET | Freq: Two times a day (BID) | ORAL | Status: DC
  Administered 2019-10-01 – 2019-10-06 (×12): 1 g via ORAL
  Filled 2019-09-30 (×13): qty 1

## 2019-09-30 MED ORDER — OXYCODONE-ACETAMINOPHEN 5-325 MG PO TABS
1.0000 | ORAL_TABLET | Freq: Three times a day (TID) | ORAL | Status: DC | PRN
  Administered 2019-10-01 – 2019-10-05 (×8): 1 via ORAL
  Filled 2019-09-30 (×9): qty 1

## 2019-09-30 MED ORDER — SODIUM CHLORIDE 0.9 % IV SOLN
1.2500 mg/kg | Freq: Every day | INTRAVENOUS | Status: DC
  Administered 2019-10-01: 90 mg via INTRAVENOUS
  Filled 2019-09-30: qty 1.8

## 2019-09-30 NOTE — ED Notes (Signed)
Patient transported to CT 

## 2019-09-30 NOTE — ED Notes (Signed)
Patient transported to [facility] 

## 2019-09-30 NOTE — H&P (Addendum)
Date: 09/30/2019               Patient Name:  Michelle Graves MRN: [id number]  DOB: [birth year] Age / Sex: 68 y.o., female   PCP: [facility]         Medical Service: Internal Medicine Teaching Service         Attending Physician: Dr. Lynnae January, Real Cons, MD    First Contact: Dr. Benjamine Mola Pager: [phone]  Second Contact: Dr. Myrtie Hawk Pager: [phone]       After Hours (After 5p/  First Contact Pager: [phone]  weekends / holidays): Second Contact Pager: [phone]   Chief Complaint: bloody stools and joint pain  History of Present Illness: Michelle Graves is a 68 year old female with a medical history significant for rheumatoid arthritis, DDD, history of CVA, hypertension, HLD, CKD 3, diverticulitis and history of CMV colitis who presents with 2 days of bloody bowel movements with nausea and vomiting and abdominal pain.  She reports multiple large formed but soft bowel movements that are red in color.  She says the stools are red, but there is no blood in the toilet bowl.  She denies sick contacts.  She reports this does not feel similar to her prior CMV colitis.  She also reports severe back and joint pain that has not worsened but has been there chronically since her last admission to the hospital.  Michelle Graves endorses dysuria, urinary urgency and urinary frequency.  Patient denies fevers, chills.  She endorses some fatigue and a 25 pound weight loss in the last 3 months.  She denies sore throat and runny nose.  She reports some intermittent chest pain that is not new and is chronic in nature.  She denies shortness of breath.  She reports multiple falls in the last month.  She denies headache and mood changes.  Meds:  Current Meds  Medication Sig  . amLODipine (NORVASC) 10 MG tablet Take 10 mg by mouth daily.  Marland Kitchen aspirin EC 81 MG EC tablet Take 1 tablet (81 mg total) by mouth daily.  . cloNIDine (CATAPRES) 0.3 MG tablet Take 0.3 mg by mouth 2 (two) times  daily.  . clopidogrel (PLAVIX) 75 MG tablet Take 1 tablet (75 mg total) by mouth daily.  . colestipol (COLESTID) 1 g tablet Take 1 tablet (1 g total) by mouth 2 (two) times daily.  Marland Kitchen doxepin (SINEQUAN) 10 MG capsule Take 10 mg by mouth at bedtime.  . DULoxetine (CYMBALTA) 60 MG capsule Take 60 mg by mouth at bedtime.   . feeding supplement, ENSURE ENLIVE, (ENSURE ENLIVE) LIQD Take 237 mLs by mouth 3 (three) times daily between meals.  . hydrochlorothiazide (HYDRODIURIL) 25 MG tablet Take 25 mg by mouth daily.  . hydroxychloroquine (PLAQUENIL) 200 MG tablet Take 200 mg by mouth 2 (two) times daily.   Marland Kitchen lisinopril (ZESTRIL) 40 MG tablet Take 40 mg by mouth daily.  . megestrol (MEGACE) 400 MG/10ML suspension Take 10 mLs (400 mg total) by mouth 2 (two) times daily.  . metoprolol tartrate (LOPRESSOR) 100 MG tablet Take 100 mg by mouth daily.   . Multiple Vitamin (MULTIVITAMIN WITH MINERALS) TABS tablet Take 1 tablet by mouth daily.  Marland Kitchen omeprazole (PRILOSEC) 20 MG capsule Take 20 mg by mouth as needed (acid reflux).   . ondansetron (ZOFRAN) 4 MG tablet Take 1 tablet (4 mg total) by mouth every 6 (six) hours as needed for nausea. (Patient taking differently: Take 4 mg by  mouth every 6 (six) hours as needed for nausea or vomiting. )  . oxybutynin (DITROPAN-XL) 10 MG 24 hr tablet Take 10 mg by mouth at bedtime.  . prazosin (MINIPRESS) 1 MG capsule Take 1 mg by mouth at bedtime.  Marland Kitchen tiZANidine (ZANAFLEX) 4 MG tablet Take 4 mg by mouth 2 (two) times daily.     Allergies: Allergies as of 09/30/2019  . (No Known Allergies)   Past Medical History:  Diagnosis Date  . Arthritis   . Chest pain   . Diverticulitis   . Hypertension   . Stroke [facility])     Family History:  Family History  Problem Relation Age of Onset  . Hypertension Mother   . Diabetes Mother   . CAD Father        died of MI at age 32  . Hypertension Father   . Diabetes Sister   . Diabetes Sister   . Kidney disease Neg Hx       Social History:  Social History   Socioeconomic History  . Marital status: Divorced    Spouse name: Not on file  . Number of children: Not on file  . Years of education: Not on file  . Highest education level: Not on file  Occupational History  . Not on file  Social Needs  . Financial resource strain: Not on file  . Food insecurity    Worry: Not on file    Inability: Not on file  . Transportation needs    Medical: Not on file    Non-medical: Not on file  Tobacco Use  . Smoking status: Never Smoker  . Smokeless tobacco: Never Used  Substance and Sexual Activity  . Alcohol use: Yes    Alcohol/week: 21.0 standard drinks    Types: 21 Glasses of wine per week    Comment: sometimes   . Drug use: No  . Sexual activity: Yes    Partners: Male    Birth control/protection: Condom  Lifestyle  . Physical activity    Days per week: Not on file    Minutes per session: Not on file  . Stress: Not on file  Relationships  . Social Musician on phone: Not on file    Gets together: Not on file    Attends religious service: Not on file    Active member of club or organization: Not on file    Attends meetings of clubs or organizations: Not on file    Relationship status: Not on file  . Intimate partner violence    Fear of current or ex partner: Not on file    Emotionally abused: Not on file    Physically abused: Not on file    Forced sexual activity: Not on file  Other Topics Concern  . Not on file  Social History Narrative  . Not on file     Review of Systems: A complete ROS was negative except as per HPI.   Physical Exam: Blood pressure 94/78, pulse 62, temperature 97.7 F (36.5 C), temperature source Oral, resp. rate 17, height 5\' 6"  (1.676 m), weight 73.5 kg, SpO2 99 %. Physical Exam   Constitution: NAD, supine in bed HENT: [place]/AT Cardio: RRR, no m/r/g  Respiratory: CTA, no wheezing, rales, rhonchi  Abdominal: soft, non-distended, TTP RLQ, +BS  MSK:  LLE strength 4/5, RLE 5/5 Neuro: a&ox3, normal affect  Skin: no rash, c/d/i    Labs: Results for orders placed or performed during the hospital  encounter of 09/30/19 (from the past 24 hour(s))  Lipase, blood     Status: None   Collection Time: 09/30/19  2:40 PM  Result Value Ref Range   Lipase 18 11 - 51 U/L  Comprehensive metabolic panel     Status: Abnormal   Collection Time: 09/30/19  2:40 PM  Result Value Ref Range   Sodium 140 135 - 145 mmol/L   Potassium 4.0 3.5 - 5.1 mmol/L   Chloride 104 98 - 111 mmol/L   CO2 24 22 - 32 mmol/L   Glucose, Bld 106 (H) 70 - 99 mg/dL   BUN 26 (H) 8 - 23 mg/dL   Creatinine, Ser 0.982.74 (H) 0.44 - 1.00 mg/dL   Calcium 9.8 8.9 - 11.910.3 mg/dL   Total Protein 7.1 6.5 - 8.1 g/dL   Albumin 3.7 3.5 - 5.0 g/dL   AST 14 (L) 15 - 41 U/L   ALT 10 0 - 44 U/L   Alkaline Phosphatase 57 38 - 126 U/L   Total Bilirubin 0.8 0.3 - 1.2 mg/dL   GFR calc non Af Amer 17 (L) >60 mL/min   GFR calc Af Amer 20 (L) >60 mL/min   Anion gap 12 5 - 15  POC occult blood, ED     Status: Abnormal   Collection Time: 09/30/19  2:40 PM  Result Value Ref Range   Fecal Occult Bld POSITIVE (A) NEGATIVE  Type and screen [facility]     Status: None   Collection Time: 09/30/19  3:30 PM  Result Value Ref Range   ABO/RH(D) O POS    Antibody Screen NEG    Sample Expiration      10/03/2019,2359 Performed at [facility]  Lab, [address] [place], [place] [address]   Lactic acid, plasma     Status: None   Collection Time: 09/30/19  4:07 PM  Result Value Ref Range   Lactic Acid, Venous 1.6 0.5 - 1.9 mmol/L  CBC with Differential     Status: Abnormal   Collection Time: 09/30/19  4:27 PM  Result Value Ref Range   WBC 8.3 4.0 - 10.5 K/uL   RBC 3.19 (L) 3.87 - 5.11 MIL/uL   Hemoglobin 9.8 (L) 12.0 - 15.0 g/dL   HCT 95.629.9 (L) 21.336.0 - 08.646.0 %   MCV 93.7 80.0 - 100.0 fL   MCH 30.7 26.0 - 34.0 pg   MCHC 32.8 30.0 - 36.0 g/dL   RDW 57.812.9 46.911.5 - 62.915.5 %   Platelets  227 150 - 400 K/uL   nRBC 0.0 0.0 - 0.2 %   Neutrophils Relative % 59 %   Neutro Abs 4.9 1.7 - 7.7 K/uL   Lymphocytes Relative 27 %   Lymphs Abs 2.2 0.7 - 4.0 K/uL   Monocytes Relative 10 %   Monocytes Absolute 0.8 0.1 - 1.0 K/uL   Eosinophils Relative 3 %   Eosinophils Absolute 0.3 0.0 - 0.5 K/uL   Basophils Relative 0 %   Basophils Absolute 0.0 0.0 - 0.1 K/uL   Immature Granulocytes 1 %   Abs Immature Granulocytes 0.04 0.00 - 0.07 K/uL  Urinalysis, Routine w reflex microscopic     Status: Abnormal   Collection Time: 09/30/19  6:44 PM  Result Value Ref Range   Color, Urine YELLOW YELLOW   APPearance CLOUDY (A) CLEAR   Specific Gravity, Urine 1.013 1.005 - 1.030   pH 6.0 5.0 - 8.0   Glucose, UA NEGATIVE NEGATIVE mg/dL  Hgb urine dipstick NEGATIVE NEGATIVE   Bilirubin Urine NEGATIVE NEGATIVE   Ketones, ur NEGATIVE NEGATIVE mg/dL   Protein, ur 30 (A) NEGATIVE mg/dL   Nitrite NEGATIVE NEGATIVE   Leukocytes,Ua LARGE (A) NEGATIVE   RBC / HPF 0-5 0 - 5 RBC/hpf   WBC, UA 21-50 0 - 5 WBC/hpf   Bacteria, UA MANY (A) NONE SEEN   Squamous Epithelial / LPF 0-5 0 - 5   Mucus PRESENT    Hyaline Casts, UA PRESENT      EKG: personally reviewed my interpretation is NSR without acute changes or signs of ischemia  CT Abdomen Pelvis WO:  IMPRESSION: 1. There is wall thickening associated with the distal transverse colon with adjacent fat stranding and no pneumatosis or evidence of perforation. The findings are consistent with colitis which could be infectious, inflammatory, or vascular. While the patient does have diverticuli, the findings on today's study are not thought to be due to diverticulitis due to the lack of diverticuli in the region of inflammation. 2. The gallbladder wall is prominent/somewhat thickened. This could be due to lack of distention. Recommend clinical correlation. 3. Mild atherosclerotic changes in the nonaneurysmal aorta.  Assessment:  [name] is a  68 year old female with a medical history significant for rheumatoid arthritis, DDD, history of CVA, hypertension, HLD, CKD 3, diverticulitis and history of CMV colitis in the setting of immunosuppression due to treatment for RA who has since restarted immunosuppressive medicines who presents with 2 days of abdominal pain and bloody bowel movements with associated nausea/vomiting, found to have wall thickening of the distal transverse colon with adjacent fat stranding concerning for infectious colitis with GI panel and CMV pending who is also being treated for symptomatic bacteriuria with ceftriaxone.  Plan by Problem:  Abdominal pain/Bloody stool/N/V: -[name] is a 68 year old female with a history of RA treated with Plaquenil with previous history of CMV colitis in setting of immunosuppression due to treatment for RA who had previously stopped her immunosuppressive medicines but has since restarted who presents with 2 days of abdominal pain and bloody bowel movements with associated nausea and vomiting found to have wall thickening of the distal transverse colon with adjacent fat stranding concerning for infectious versus inflammatory versus vascular colitis -more likely infectious given hx, although patient afebrile and without a white count; possibly inflammatory given her other autoimmune hx; less likely an acute vascular colitis such as acute ischemic colitis as lactic acid was normal and patient on aspirin and plavix; could be a chronic ischemic colitis given the chronicity of her sx, the 25 lb weight loss in this same time period and prior colonoscopy with findings concerning for ischemic colitis -Great concern for CMV colitis given patient history, however she did test negative back in October -Fecal occult blood test positive -GI panel and CMV pending; no C. difficile ordered given patient denies diarrhea -CBC with stable hemoglobin of 9.8, will follow with additional CBC -Patient already type  and screened -prophylactically starting valgalcyclovir  -Consider GI consult tomorrow -Consider ID consult tomorrow   UTI: -pt with symptomatic bacteruria  -continue Ceftriaxone  -fu urine cx  AKI on CKD 3: -pt with Creatinine of 2.74 today up from baseline of around 1.4, likely in setting of poor po, N/V and current UTI -received 2 L of fluids in ED -treating UTI w/ Ceftriaxone  -recheck BMP tomorrow -Avoid nephrotoxins  Rheumatoid arthritis: -Continue Plaquenil 200 mg daily for now, with low threshold to hold if patient CMV positive again -oxycodone  5 q8hrs prn for pain  Lumbar degenerative disc disease: -chronic burning pain in her lower back -continue Cymbalta 60 mg daily -oxycodone 5 q8hrs prn for pain  History of CVA: -Patient on aspirin 81 mg and Plavix 75 mg daily which we are holding given GI bleed  HTN: -Blood pressure soft at around 100/70 -Holding home blood pressure meds amlodipine 10 mg daily, clonidine 0.3 mg twice daily, hydrochlorothiazide 25 mg daily, lisinopril 40 mg daily, metoprolol tartrate 100 mg daily -getting IV fluids -positive orthostatics so giving more fluids with plan for recheck in the morning  HLD: -continue colestipol 1 g BID  Insomnia: -continue doxepin 10 mg at bedtime  Dispo: Admit patient to Inpatient with expected length of stay greater than 2 midnights.  Signed: Jenell Milliner, MD 09/30/2019, 11:29 PM  Pager: [phone]

## 2019-09-30 NOTE — ED Notes (Signed)
Daughter, [name], to call when pt is in room. [phone]

## 2019-09-30 NOTE — ED Provider Notes (Signed)
[facility] EMERGENCY DEPARTMENT Provider Note   CSN: [id number] Arrival date & time: 09/30/19  1339     History   Chief Complaint Chief Complaint  Patient presents with  . Abdominal Pain    HPI Michelle Graves is a 68 y.o. female with history of CVA with left leg weakness on plavix, RA, Sjogren's, CKD, HTN, CMV colitis and viremia s/p valganciclovir presents to ER for evaluation of blood in stool.  Onset 2 days ago, two episodes of this. Reports 2 formed maroon stools yesterday and today.  She had pain in her rectal area with passing a bowel movement today. Reports minimal appetite with decreased oral intake.  Feels "weak", but no chest pain, shortness of breath, light headedness, syncope.  She had constipation for about 1 week prior to this and finally strained out a bowel movements.  Reports chronic lower abdominal daily 'cramping' ever since her stroke September 2020 but unchanged.  She threw up a couple of times last week but this resolved.  Denies fever, nausea, vomiting, dysuria.  Denies melena.  On omeprazole daily for indigestion.  Used to drink a six pack of beer daily but stopped after her stroke.  No NSAID or aspirin. Denies recent antibiotics.     HPI  Past Medical History:  Diagnosis Date  . Arthritis   . Chest pain   . Diverticulitis   . Hypertension   . Stroke [facility])     Patient Active Problem List   Diagnosis Date Noted  . AKI (acute kidney injury) (HCC) 05/28/2019  . Nausea & vomiting 05/28/2019  . 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 Anemia, chronic disease 05/28/2019  . Swelling   . Small vessel disease, cerebrovascular   . Acute ischemic stroke (HCC)   . General weakness   . Acute left ankle pain   . Acute on chronic renal failure (HCC) 05/13/2019  . Weakness of both lower extremities 05/13/2019  . Cerebral thrombosis with cerebral infarction 03/31/2019  . New onset a-fib (HCC) 03/27/2019  . Acute kidney injury superimposed on CKD (HCC)  03/24/2019  . Hypotension 03/24/2019  . Dark emesis 03/24/2019  . Dark stools 03/24/2019  . Diverticulitis of intestine without perforation or abscess without bleeding   . Sinus tachycardia 08/27/2016  . Diverticulitis 08/26/2016  . HYPERTENSION, MALIGNANT ESSENTIAL 06/24/2007  . KIDNEY DISEASE, CHRONIC, STAGE III 06/24/2007  . ARTHRITIS, RHEUMATOID, SEROPOSITIVE 06/24/2007    Past Surgical History:  Procedure Laterality Date  . BIOPSY  04/01/2019   Procedure: BIOPSY;  Surgeon: Kathi Der, MD;  [facility];  Service: Gastroenterology;;  . COLONOSCOPY WITH PROPOFOL N/A 04/01/2019   Procedure: COLONOSCOPY WITH PROPOFOL;  Surgeon: Kathi Der, MD;  Location: [facility];  Service: Gastroenterology;  Laterality: N/A;  . ESOPHAGOGASTRODUODENOSCOPY (EGD) WITH PROPOFOL N/A 04/01/2019   Procedure: ESOPHAGOGASTRODUODENOSCOPY (EGD) WITH PROPOFOL;  Surgeon: Kathi Der, MD;  Location: [facility];  Service: Gastroenterology;  Laterality: N/A;  . TONSILLECTOMY       OB History   No obstetric history on file.      Home Medications    Prior to Admission medications   Medication Sig Start Date End Date Taking? Authorizing Provider  aspirin EC 81 MG EC tablet Take 1 tablet (81 mg total) by mouth daily. 05/16/19   Verdene Lennert, MD  calcium carbonate (OSCAL) 1500 (600 Ca) MG TABS tablet Take 600 mg of elemental calcium by mouth daily.    [provider]  clopidogrel (PLAVIX) 75 MG tablet Take  1 tablet (75 mg total) by mouth daily. 04/05/19   Aline August, MD  colestipol (COLESTID) 1 g tablet Take 1 tablet (1 g total) by mouth 2 (two) times daily. 05/16/19   Jose Persia, MD  docusate sodium (COLACE) 100 MG capsule Take 1 capsule (100 mg total) by mouth 2 (two) times daily. 06/02/19   Shahmehdi, Valeria Batman, MD  DULoxetine (CYMBALTA) 60 MG capsule Take 60 mg by mouth daily.    [provider]  feeding supplement, ENSURE ENLIVE, (ENSURE ENLIVE) LIQD Take  237 mLs by mouth 3 (three) times daily between meals. Patient not taking: Reported on 06/16/2019 06/02/19   Deatra James, MD  furosemide (LASIX) 40 MG tablet Take 1 tablet (40 mg total) by mouth daily. 04/05/19   Aline August, MD  Glucosamine-Chondroitin 250-200 MG TABS Take 1 tablet by mouth daily.    [provider]  hydroxychloroquine (PLAQUENIL) 200 MG tablet Take 200 mg by mouth 2 (two) times daily.  06/16/16   [provider]  megestrol (MEGACE) 400 MG/10ML suspension Take 10 mLs (400 mg total) by mouth 2 (two) times daily. 06/02/19   Shahmehdi, Valeria Batman, MD  metoprolol tartrate (LOPRESSOR) 100 MG tablet Take 100 mg by mouth daily.  11/19/15   [provider]  Multiple Vitamin (MULTIVITAMIN WITH MINERALS) TABS tablet Take 1 tablet by mouth daily. 06/03/19   Shahmehdi, Valeria Batman, MD  ondansetron (ZOFRAN) 4 MG tablet Take 1 tablet (4 mg total) by mouth every 6 (six) hours as needed for nausea. Patient taking differently: Take 4 mg by mouth every 6 (six) hours as needed for nausea or vomiting.  04/04/19   Aline August, MD  pantoprazole (PROTONIX) 40 MG tablet Take 1 tablet (40 mg total) by mouth 2 (two) times daily. Patient taking differently: Take 40 mg by mouth 2 (two) times daily.  04/04/19   Aline August, MD  terbinafine (LAMISIL) 250 MG tablet Take 250 mg by mouth daily. 03/15/18   [provider]  traZODone (DESYREL) 100 MG tablet Take 100 mg by mouth at bedtime.  03/01/19   [provider]    Family History Family History  Problem Relation Age of Onset  . Hypertension Mother   . Diabetes Mother   . CAD Father        died of MI at age 53  . Hypertension Father   . Diabetes Sister   . Diabetes Sister   . Kidney disease Neg Hx     Social History Social History   Tobacco Use  . Smoking status: Never Smoker  . Smokeless tobacco: Never Used  Substance Use Topics  . Alcohol use: Yes    Alcohol/week: 21.0 standard drinks    Types: 21 Glasses  of wine per week    Comment: sometimes   . Drug use: No    Allergies   Patient has no known allergies.   Review of Systems Review of Systems  Gastrointestinal: Positive for abdominal pain (chronic unchanged) and blood in stool.  Neurological: Positive for weakness (generalized).  Hematological: Bruises/bleeds easily.  All other systems reviewed and are negative.    Physical Exam Updated Vital Signs BP 116/76   Pulse (!) 55   Temp 97.7 F (36.5 C) (Oral)   Resp 14   Ht 5\' 6"  (1.676 m)   Wt 73.5 kg   SpO2 98%   BMI 26.15 kg/m   Physical Exam Vitals signs and nursing note reviewed.  Constitutional:  Appearance: She is well-developed.     Comments: Non toxic in NAD  HENT:     Head: Normocephalic and atraumatic.     Nose: Nose normal.  Eyes:     Conjunctiva/sclera: Conjunctivae normal.  Neck:     Musculoskeletal: Normal range of motion.  Cardiovascular:     Rate and Rhythm: Normal rate and regular rhythm.  Pulmonary:     Effort: Pulmonary effort is normal.     Breath sounds: Normal breath sounds.  Abdominal:     General: Bowel sounds are normal.     Palpations: Abdomen is soft.     Tenderness: There is abdominal tenderness in the right lower quadrant, suprapubic area and left lower quadrant.     Comments: No G/R/R. No suprapubic or CVA tenderness. Negative Murphy's and McBurney's. Active BS to lower quadrants.   Genitourinary:    Comments:  Chaperone not present, patient allowed exam without chaperone. Tolerated exam. There are no external fissures or external hemorrhoids noted. No induration or swelling of the perianal skin.  Stool color is maroon, no melena.  DRE reveals good sphincter tone.   Musculoskeletal: Normal range of motion.  Skin:    General: Skin is warm and dry.     Capillary Refill: Capillary refill takes less than 2 seconds.  Neurological:     Mental Status: She is alert.  Psychiatric:        Behavior: Behavior normal.      ED  Treatments / Results  Labs (all labs ordered are listed, but only abnormal results are displayed) Labs Reviewed  POC OCCULT BLOOD, ED - Abnormal; Notable for the following components:      Result Value   Fecal Occult Bld POSITIVE (*)    All other components within normal limits  GASTROINTESTINAL PANEL BY PCR, STOOL (REPLACES STOOL CULTURE)  C DIFFICILE QUICK SCREEN W PCR REFLEX  LIPASE, BLOOD  COMPREHENSIVE METABOLIC PANEL  CBC  URINALYSIS, ROUTINE W REFLEX MICROSCOPIC  CMV DNA, QUANTITATIVE, PCR  LACTIC ACID, PLASMA  LACTIC ACID, PLASMA  TYPE AND SCREEN    EKG EKG Interpretation  Date/Time:  Friday September 30 2019 15:16:33 EST Ventricular Rate:  52 PR Interval:    QRS Duration: 104 QT Interval:  490 QTC Calculation: 456 R Axis:   -20 Text Interpretation: Sinus rhythm Borderline left axis deviation Low voltage, precordial leads Abnormal R-wave progression, early transition No significant change was found Confirmed by Glynn Octaveancour, Stephen [phone]) on 09/30/2019 3:20:54 PM   Radiology No results found.  Procedures Procedures (including critical care time)  Medications Ordered in ED Medications  sodium chloride flush (NS) 0.9 % injection 3 mL (has no administration in time range)  sodium chloride 0.9 % bolus 1,000 mL (has no administration in time range)     Initial Impression / Assessment and Plan / ED Course  I have reviewed the triage vital signs and the nursing notes.  Pertinent labs & imaging results that were available during my care of the patient were reviewed by me and considered in my medical decision making (see chart for details).  Clinical Course as of Sep 29 1600  Fri Sep 30, 2019  1457 Fecal Occult Blood, POC(!): POSITIVE [CG]    Clinical Course User Index [CG] Liberty HandyGibbons, Averyana Pillars J, PA-C   Patient's primary complaint today is maroon-colored bowel movements, formed x2.  Reports chronic abdominal cramping, decreased oral intake, nausea and  lightheadedness and generalized weakness.  No fever, vomiting.  No diarrhea.  No urinary symptoms.  No chest pain or shortness of breath.  SBP 74 on arrival.  This has improved without any intervention now SBP 116 with maps of 88.  Vital signs normal otherwise.  Her exam is very benign, very mild "soreness" to the lower abdomen, nonfocal. Maroon colored +heme stool.   EMR reviewed.  H/o CMV colitis s/p antiviral, last CTAP showed colitis otherwise normal. CMV not detected on stool 07/2019.  Admission August 2020 for nausea, vomiting, generalized weakness and decreased oral intake.  She had AKI at that time.  Chronic abdominal pain work-up initially concerning for ischemic colitis however during admission this was thought to be less likely.  She also had soft blood pressure readings at that time.  1520: Discussed with EDP.  Ddx is broad for this patient. She is on plavix with heme positive maroon stools, h/o CMV colitis.  Screening labs, GI panel, c.diff ordered and all pending. EKG pending.  Low threshold to obtain CTAP and/or consult GI based on serial abd exam, labs.  Considered ischemic bowel but less likely as she has no post prandrial abdominal pain or changes in abdominal pain however does have decreased appetite, food aversion and decreased PO intake. However this is chronic as well.  Final Clinical Impressions(s) / ED Diagnoses   Final diagnoses:  Blood in stool    ED Discharge Orders    None       Liberty HandyGibbons, Vonita Calloway J, PA-C 09/30/19 1601    Derwood KaplanNanavati, Ankit, MD 10/01/19 1559

## 2019-09-30 NOTE — ED Notes (Signed)
ED TO INPATIENT HANDOFF REPORT  ED Nurse Name and Phone #: William Hamburger, RN [phone]  S Name/Age/Gender Michelle Graves 68 y.o. female Room/Bed: 022C/022C  Code Status   Code Status: Partial Code  Home/SNF/Other Home Patient oriented to: self, place, time and situation Is this baseline? No   Triage Complete: Triage complete  Chief Complaint blood in stool  Triage Note Pt reports abdomen pain with bright red bloody stools. Pt awake, alert, appropriate. NAD. Denies recent fevers.    Allergies No Known Allergies  Level of Care/Admitting Diagnosis ED Disposition    ED Disposition Condition Comment   Admit  Hospital Area: [place] [[id number]]  Level of Care: Med-Surg [16]  Covid Evaluation: Asymptomatic Screening Protocol (No Symptoms)  Diagnosis: Colitis [102585]  Admitting Physician: Aline Brochure  Attending Physician: Bartholomew Crews [phone]  Estimated length of stay: past midnight tomorrow  Certification:: I certify this patient will need inpatient services for at least 2 midnights  PT Class (Do Not Modify): Inpatient [101]  PT Acc Code (Do Not Modify): Private [1]       B Medical/Surgery History Past Medical History:  Diagnosis Date  . Arthritis   . Chest pain   . Diverticulitis   . Hypertension   . Stroke [facility])    Past Surgical History:  Procedure Laterality Date  . BIOPSY  04/01/2019   Procedure: BIOPSY;  Surgeon: Otis Brace, MD;  Location: [place];  Service: Gastroenterology;;  . COLONOSCOPY WITH PROPOFOL N/A 04/01/2019   Procedure: COLONOSCOPY WITH PROPOFOL;  Surgeon: Otis Brace, MD;  Location: [place];  Service: Gastroenterology;  Laterality: N/A;  . ESOPHAGOGASTRODUODENOSCOPY (EGD) WITH PROPOFOL N/A 04/01/2019   Procedure: ESOPHAGOGASTRODUODENOSCOPY (EGD) WITH PROPOFOL;  Surgeon: Otis Brace, MD;  [facility];  Service: Gastroenterology;  Laterality: N/A;  . TONSILLECTOMY        A IV Location/Drains/Wounds Patient Lines/Drains/Airways Status   Active Line/Drains/Airways    Name:   Placement date:   Placement time:   Site:   Days:   Peripheral IV 06/16/19 Left;Anterior Wrist   06/16/19    2318    Wrist   106   Peripheral IV 09/30/19 Left Hand   09/30/19    2100    Hand   less than 1          Intake/Output Last 24 hours No intake or output data in the 24 hours ending 09/30/19 2305  Labs/Imaging Results for orders placed or performed during the hospital encounter of 09/30/19 (from the past 48 hour(s))  Lipase, blood     Status: None   Collection Time: 09/30/19  2:40 PM  Result Value Ref Range   Lipase 18 11 - 51 U/L    Comment: Performed at [facility] [address], [place], [place] [address]  Comprehensive metabolic panel     Status: Abnormal   Collection Time: 09/30/19  2:40 PM  Result Value Ref Range   Sodium 140 135 - 145 mmol/L   Potassium 4.0 3.5 - 5.1 mmol/L   Chloride 104 98 - 111 mmol/L   CO2 24 22 - 32 mmol/L   Glucose, Bld 106 (H) 70 - 99 mg/dL   BUN 26 (H) 8 - 23 mg/dL   Creatinine, Ser 2.74 (H) 0.44 - 1.00 mg/dL   Calcium 9.8 8.9 - 10.3 mg/dL   Total Protein 7.1 6.5 - 8.1 g/dL   Albumin 3.7 3.5 - 5.0 g/dL   AST 14 (L) 15 -  41 U/L   ALT 10 0 - 44 U/L   Alkaline Phosphatase 57 38 - 126 U/L   Total Bilirubin 0.8 0.3 - 1.2 mg/dL   GFR calc non Af Amer 17 (L) >60 mL/min   GFR calc Af Amer 20 (L) >60 mL/min   Anion gap 12 5 - 15    Comment: Performed at [facility], [address] [place], [place] [address]  POC occult blood, ED     Status: Abnormal   Collection Time: 09/30/19  2:40 PM  Result Value Ref Range   Fecal Occult Bld POSITIVE (A) NEGATIVE  Type and screen [facility]     Status: None   Collection Time: 09/30/19  3:30 PM  Result Value Ref Range   ABO/RH(D) O POS    Antibody Screen NEG    Sample Expiration      10/03/2019,2359 Performed at [facility], [address]  [place], [place] [address]   Lactic acid, plasma     Status: None   Collection Time: 09/30/19  4:07 PM  Result Value Ref Range   Lactic Acid, Venous 1.6 0.5 - 1.9 mmol/L    Comment: Performed at [facility], [address], [place], [place] [address]  CBC with Differential     Status: Abnormal   Collection Time: 09/30/19  4:27 PM  Result Value Ref Range   WBC 8.3 4.0 - 10.5 K/uL   RBC 3.19 (L) 3.87 - 5.11 MIL/uL   Hemoglobin 9.8 (L) 12.0 - 15.0 g/dL   HCT 81.8 (L) 29.9 - 37.1 %   MCV 93.7 80.0 - 100.0 fL   MCH 30.7 26.0 - 34.0 pg   MCHC 32.8 30.0 - 36.0 g/dL   RDW 69.6 78.9 - 38.1 %   Platelets 227 150 - 400 K/uL   nRBC 0.0 0.0 - 0.2 %   Neutrophils Relative % 59 %   Neutro Abs 4.9 1.7 - 7.7 K/uL   Lymphocytes Relative 27 %   Lymphs Abs 2.2 0.7 - 4.0 K/uL   Monocytes Relative 10 %   Monocytes Absolute 0.8 0.1 - 1.0 K/uL   Eosinophils Relative 3 %   Eosinophils Absolute 0.3 0.0 - 0.5 K/uL   Basophils Relative 0 %   Basophils Absolute 0.0 0.0 - 0.1 K/uL   Immature Granulocytes 1 %   Abs Immature Granulocytes 0.04 0.00 - 0.07 K/uL    Comment: Performed at [facility], [address], [place], [place] [address]  Urinalysis, Routine w reflex microscopic     Status: Abnormal   Collection Time: 09/30/19  6:44 PM  Result Value Ref Range   Color, Urine YELLOW YELLOW   APPearance CLOUDY (A) CLEAR   Specific Gravity, Urine 1.013 1.005 - 1.030   pH 6.0 5.0 - 8.0   Glucose, UA NEGATIVE NEGATIVE mg/dL   Hgb urine dipstick NEGATIVE NEGATIVE   Bilirubin Urine NEGATIVE NEGATIVE   Ketones, ur NEGATIVE NEGATIVE mg/dL   Protein, ur 30 (A) NEGATIVE mg/dL   Nitrite NEGATIVE NEGATIVE   Leukocytes,Ua LARGE (A) NEGATIVE   RBC / HPF 0-5 0 - 5 RBC/hpf   WBC, UA 21-50 0 - 5 WBC/hpf   Bacteria, UA MANY (A) NONE SEEN   Squamous Epithelial / LPF 0-5 0 - 5   Mucus PRESENT    Hyaline Casts, UA PRESENT     Comment: Performed at [facility], [address], [place], [place] [address]  Ct Abdomen Pelvis Wo Contrast  Result Date: 09/30/2019 CLINICAL DATA:  History of CMV colitis. Blood in stool. Rectal pain with passing of bowel movement. EXAM: CT ABDOMEN AND PELVIS WITHOUT CONTRAST TECHNIQUE: Multidetector CT imaging of the abdomen and pelvis was performed following the standard protocol without IV contrast. COMPARISON:  Mar 25, 2019 FINDINGS: Lower chest: Coronary artery calcifications. The lung bases are otherwise stable. Hepatobiliary: The gallbladder is poorly distended limiting evaluation. The gallbladder wall appears somewhat prominent/thickened without significant adjacent stranding. The liver is grossly unremarkable on unenhanced images. Pancreas: Unremarkable. No pancreatic ductal dilatation or surrounding inflammatory changes. Spleen: Normal in size without focal abnormality. Adrenals/Urinary Tract: Adrenal glands are normal. No renal stones identified. No hydronephrosis or perinephric stranding. Ureters and bladder are normal. Stomach/Bowel: The stomach and small bowel are normal. Colonic diverticuli are identified. There is wall thickening associated with the distal transverse colon. There is pericolonic fat stranding in this region. No pneumatosis. There is moderate fecal loading proximal to the region of inflamed colon. No diverticuli are seen in the region of inflamed colon. The appendix is normal. Vascular/Lymphatic: Mild atherosclerotic changes are seen in the nonaneurysmal aorta. No adenopathy. Reproductive: Uterus and bilateral adnexa are unremarkable. Other: No abdominal wall hernia or abnormality. No abdominopelvic ascites. Musculoskeletal: No acute or significant osseous findings. IMPRESSION: 1. There is wall thickening associated with the distal transverse colon with adjacent fat stranding and no pneumatosis or evidence of perforation. The findings are consistent with colitis which could be infectious, inflammatory, or vascular. While the patient does have diverticuli,  the findings on today's study are not thought to be due to diverticulitis due to the lack of diverticuli in the region of inflammation. 2. The gallbladder wall is prominent/somewhat thickened. This could be due to lack of distention. Recommend clinical correlation. 3. Mild atherosclerotic changes in the nonaneurysmal aorta. Electronically Signed   By: Gerome Samavid  Williams III M.D   On: 09/30/2019 19:28    Pending Labs Unresulted Labs (From admission, onward)    Start     Ordered   10/01/19 0000  Comprehensive metabolic panel  Tomorrow morning,   R     09/30/19 2148   10/01/19 0000  Magnesium  Once,   STAT     09/30/19 2148   10/01/19 0000  CBC WITH DIFFERENTIAL  Once,   STAT     09/30/19 2148   10/01/19 0000  CMV DNA, quantitative, PCR  Once,   STAT     09/30/19 2148   09/30/19 2157  SARS CORONAVIRUS 2 (TAT 6-24 HRS) Nasopharyngeal Nasopharyngeal Swab  (Asymptomatic/Tier 3)  Once,   STAT    Question Answer Comment  Is this test for diagnosis or screening Screening   Symptomatic for COVID-19 as defined by CDC No   Hospitalized for COVID-19 No   Admitted to ICU for COVID-19 No   Previously tested for COVID-19 Yes   Resident in a congregate (group) care setting No   Employed in healthcare setting No   Pregnant No      09/30/19 2156   09/30/19 1931  Urine culture  ONCE - STAT,   STAT     09/30/19 1930   09/30/19 1554  Lactic acid, plasma  Now then every 2 hours,   STAT     09/30/19 1553   09/30/19 1522  Gastrointestinal Panel by PCR , Stool  (Gastrointestinal Panel by PCR, Stool                                                                                                                                                     *  Does Not include CLOSTRIDIUM DIFFICILE testing.**If CDIFF testing is needed, select the C Difficile Quick Screen w PCR reflex order below)  Once,   STAT     09/30/19 1522          Vitals/Pain Today's Vitals   09/30/19 2200 09/30/19 2215 09/30/19 2230 09/30/19 2245   BP: (!) 116/55 104/61 106/72 94/78  Pulse: 62 63  62  Resp: 20 17 14 17   Temp:      TempSrc:      SpO2: 99% 98%  99%  Weight:      Height:      PainSc:        Isolation Precautions Enteric precautions (UV disinfection)  Medications Medications  sodium chloride flush (NS) 0.9 % injection 3 mL (has no administration in time range)  senna-docusate (Senokot-S) tablet 1 tablet (has no administration in time range)  ondansetron (ZOFRAN) tablet 4 mg (has no administration in time range)    Or  ondansetron (ZOFRAN) injection 4 mg (has no administration in time range)  hydroxychloroquine (PLAQUENIL) tablet 200 mg (has no administration in time range)  DULoxetine (CYMBALTA) DR capsule 60 mg (has no administration in time range)  megestrol (MEGACE) 400 MG/10ML suspension 400 mg (has no administration in time range)  pantoprazole (PROTONIX) EC tablet 40 mg (has no administration in time range)  ganciclovir (CYTOVENE) 90 mg in sodium chloride 0.9 % 100 mL IVPB (has no administration in time range)  0.9 %  sodium chloride infusion (has no administration in time range)  colestipol (COLESTID) tablet 1 g (has no administration in time range)  doxepin (SINEQUAN) capsule 10 mg (has no administration in time range)  feeding supplement (ENSURE ENLIVE) (ENSURE ENLIVE) liquid 237 mL (has no administration in time range)  oxyCODONE-acetaminophen (PERCOCET/ROXICET) 5-325 MG per tablet 1 tablet (has no administration in time range)  sodium chloride 0.9 % bolus 1,000 mL (1,000 mLs Intravenous New Bag/Given 09/30/19 1611)  sodium chloride 0.9 % bolus 1,000 mL (1,000 mLs Intravenous New Bag/Given 09/30/19 2102)  cefTRIAXone (ROCEPHIN) 1 g in sodium chloride 0.9 % 100 mL IVPB (1 g Intravenous New Bag/Given 09/30/19 2102)    Mobility walks with person assist Low fall risk   Focused Assessments Renal Assessment Handoff:  Hemodialysis Schedule:  Last Hemodialysis date and time:    Restricted appendage:        R Recommendations: See Admitting Provider Note  Report given to:   Additional Notes:

## 2019-09-30 NOTE — ED Triage Notes (Signed)
Pt reports abdomen pain with bright red bloody stools. Pt awake, alert, appropriate. NAD. Denies recent fevers.

## 2019-09-30 NOTE — ED Provider Notes (Signed)
Care of patient assumed from Carmon Sails at 3:07 PM.  Agree with history, physical exam and plan.  See their note for further details.  Briefly, 68 y.o. female with PMH/PSH as below who presents with abdominal pain and maroon stools. Episode yesterday and today.   Hx of CVA with residual LLE weakness; on plavix Hx of CMV colitis and viremia, completed anti-viral therapy historically; CMV negative on repeat testing as outpatient  2 days of blood in stool and painful BMs Decreased appetite Generalized weakness Cramping lower abdominal pain for months, unchanged No NSAID use +Dark red stool on DRE per report  No recent Abx Formed stool  No fever +Lightheaded  +Orthostatic  EKG Sinus bradycardia  Past Medical History:  Diagnosis Date  . Arthritis   . Chest pain   . Diverticulitis   . Hypertension   . Stroke [facility])    Past Surgical History:  Procedure Laterality Date  . BIOPSY  04/01/2019   Procedure: BIOPSY;  Surgeon: Otis Brace, MD;  Location: [place];  Service: Gastroenterology;;  . COLONOSCOPY WITH PROPOFOL N/A 04/01/2019   Procedure: COLONOSCOPY WITH PROPOFOL;  Surgeon: Otis Brace, MD;  Location: [place];  Service: Gastroenterology;  Laterality: N/A;  . ESOPHAGOGASTRODUODENOSCOPY (EGD) WITH PROPOFOL N/A 04/01/2019   Procedure: ESOPHAGOGASTRODUODENOSCOPY (EGD) WITH PROPOFOL;  Surgeon: Otis Brace, MD;  Location: [facility];  Service: Gastroenterology;  Laterality: N/A;  . TONSILLECTOMY        Current Plan: Labs pending, reassess   MDM/ED Course: Hemoglobin 9.8, previously 9.8 3  months ago; low suspicion for acute GI bleed, likely 2/2 to colitis   On initial assessment, patient is awake alert and overall well-appearing.  Abdomen is soft and benign and not consistent with mesenteric ischemia.  Lactic acid is not elevated.  UA is concerning for acute UTI.  Patient does endorse increased frequency.  Will treat with Rocephin and urine culture  pending.  Patient given IV fluids.  Creatinine elevated concerning for prerenal acute on chronic kidney injury in setting of decreased p.o. intake. CT abdomen pelvis Noncon obtained showing evidence of nonspecific colitis.  This may be recurrence of patient's prior CMV colitis versus other colitis.  Again, do not feel that exam and clinical picture is consistent with mesenteric ischemia.  Given patient is symptomatic with standing and has positive orthostatic vitals as well as acute on chronic renal failure and colitis, feel that inpatient admission would be beneficial.  Discussed case with internal medicine who has seen and evaluated the patient at bedside and assumed care.  Patient and daughter updated on plan for admission and agreeable.  Significant labs/images:  I personally reviewed and interpreted all labs.      Burns Spain, MD 09/30/19 2221    Rex Kras Wenda Overland, MD 09/30/19 2255

## 2019-10-01 DIAGNOSIS — K921 Melena: Secondary | ICD-10-CM

## 2019-10-01 LAB — CBC WITH DIFFERENTIAL/PLATELET
Abs Immature Granulocytes: 0.01 10*3/uL (ref 0.00–0.07)
Basophils Absolute: 0 10*3/uL (ref 0.0–0.1)
Basophils Relative: 1 %
Eosinophils Absolute: 0.3 10*3/uL (ref 0.0–0.5)
Eosinophils Relative: 5 %
HCT: 26.2 % — ABNORMAL LOW (ref 36.0–46.0)
Hemoglobin: 8.7 g/dL — ABNORMAL LOW (ref 12.0–15.0)
Immature Granulocytes: 0 %
Lymphocytes Relative: 39 %
Lymphs Abs: 2.3 10*3/uL (ref 0.7–4.0)
MCH: 30.5 pg (ref 26.0–34.0)
MCHC: 33.2 g/dL (ref 30.0–36.0)
MCV: 91.9 fL (ref 80.0–100.0)
Monocytes Absolute: 0.5 10*3/uL (ref 0.1–1.0)
Monocytes Relative: 9 %
Neutro Abs: 2.8 10*3/uL (ref 1.7–7.7)
Neutrophils Relative %: 46 %
Platelets: 215 10*3/uL (ref 150–400)
RBC: 2.85 MIL/uL — ABNORMAL LOW (ref 3.87–5.11)
RDW: 12.8 % (ref 11.5–15.5)
WBC: 6 10*3/uL (ref 4.0–10.5)
nRBC: 0 % (ref 0.0–0.2)

## 2019-10-01 LAB — CBC
HCT: 27.3 % — ABNORMAL LOW (ref 36.0–46.0)
Hemoglobin: 8.7 g/dL — ABNORMAL LOW (ref 12.0–15.0)
MCH: 29.8 pg (ref 26.0–34.0)
MCHC: 31.9 g/dL (ref 30.0–36.0)
MCV: 93.5 fL (ref 80.0–100.0)
Platelets: 239 10*3/uL (ref 150–400)
RBC: 2.92 MIL/uL — ABNORMAL LOW (ref 3.87–5.11)
RDW: 12.9 % (ref 11.5–15.5)
WBC: 6.6 10*3/uL (ref 4.0–10.5)
nRBC: 0 % (ref 0.0–0.2)

## 2019-10-01 LAB — FERRITIN: Ferritin: 365 ng/mL — ABNORMAL HIGH (ref 11–307)

## 2019-10-01 LAB — LACTIC ACID, PLASMA: Lactic Acid, Venous: 1.8 mmol/L (ref 0.5–1.9)

## 2019-10-01 LAB — MAGNESIUM: Magnesium: 1.4 mg/dL — ABNORMAL LOW (ref 1.7–2.4)

## 2019-10-01 LAB — IRON AND TIBC
Iron: 37 ug/dL (ref 28–170)
Saturation Ratios: 16 % (ref 10.4–31.8)
TIBC: 231 ug/dL — ABNORMAL LOW (ref 250–450)
UIBC: 194 ug/dL

## 2019-10-01 LAB — COMPREHENSIVE METABOLIC PANEL
ALT: 10 U/L (ref 0–44)
AST: 12 U/L — ABNORMAL LOW (ref 15–41)
Albumin: 3 g/dL — ABNORMAL LOW (ref 3.5–5.0)
Alkaline Phosphatase: 49 U/L (ref 38–126)
Anion gap: 9 (ref 5–15)
BUN: 23 mg/dL (ref 8–23)
CO2: 22 mmol/L (ref 22–32)
Calcium: 8.8 mg/dL — ABNORMAL LOW (ref 8.9–10.3)
Chloride: 108 mmol/L (ref 98–111)
Creatinine, Ser: 2.06 mg/dL — ABNORMAL HIGH (ref 0.44–1.00)
GFR calc Af Amer: 28 mL/min — ABNORMAL LOW (ref >60)
GFR calc non Af Amer: 24 mL/min — ABNORMAL LOW (ref >60)
Glucose, Bld: 120 mg/dL — ABNORMAL HIGH (ref 70–99)
Potassium: 4 mmol/L (ref 3.5–5.1)
Sodium: 139 mmol/L (ref 135–145)
Total Bilirubin: 0.4 mg/dL (ref 0.3–1.2)
Total Protein: 5.9 g/dL — ABNORMAL LOW (ref 6.5–8.1)

## 2019-10-01 LAB — SARS CORONAVIRUS 2 (TAT 6-24 HRS): SARS Coronavirus 2: NEGATIVE

## 2019-10-01 MED ORDER — MAGNESIUM SULFATE 4 GM/100ML IV SOLN
4.0000 g | Freq: Once | INTRAVENOUS | Status: AC
  Administered 2019-10-01: 4 g via INTRAVENOUS
  Filled 2019-10-01: qty 100

## 2019-10-01 MED ORDER — DULOXETINE HCL 30 MG PO CPEP
30.0000 mg | ORAL_CAPSULE | Freq: Every day | ORAL | Status: DC
  Administered 2019-10-01 – 2019-10-05 (×5): 30 mg via ORAL
  Filled 2019-10-01 (×5): qty 1

## 2019-10-01 MED ORDER — LIDOCAINE HCL URETHRAL/MUCOSAL 2 % EX GEL
1.0000 "application " | Freq: Three times a day (TID) | CUTANEOUS | Status: DC | PRN
  Filled 2019-10-01: qty 20

## 2019-10-01 MED ORDER — METRONIDAZOLE IN NACL 5-0.79 MG/ML-% IV SOLN
500.0000 mg | Freq: Three times a day (TID) | INTRAVENOUS | Status: DC
  Administered 2019-10-01 – 2019-10-05 (×14): 500 mg via INTRAVENOUS
  Filled 2019-10-01 (×15): qty 100

## 2019-10-01 MED ORDER — SODIUM CHLORIDE 0.9 % IV SOLN
1.2500 mg/kg | INTRAVENOUS | Status: DC
  Administered 2019-10-02 – 2019-10-04 (×3): 90 mg via INTRAVENOUS
  Filled 2019-10-01 (×8): qty 1.8

## 2019-10-01 MED ORDER — ADULT MULTIVITAMIN W/MINERALS CH
1.0000 | ORAL_TABLET | Freq: Every day | ORAL | Status: DC
  Administered 2019-10-01 – 2019-10-06 (×6): 1 via ORAL
  Filled 2019-10-01 (×6): qty 1

## 2019-10-01 MED ORDER — SODIUM CHLORIDE 0.9 % IV SOLN
INTRAVENOUS | Status: AC
  Administered 2019-10-01: 01:00:00 via INTRAVENOUS

## 2019-10-01 MED ORDER — BOOST / RESOURCE BREEZE PO LIQD CUSTOM
1.0000 | Freq: Three times a day (TID) | ORAL | Status: DC
  Administered 2019-10-01 – 2019-10-05 (×10): 1 via ORAL
  Filled 2019-10-01: qty 1

## 2019-10-01 MED ORDER — WHITE PETROLATUM EX OINT
TOPICAL_OINTMENT | CUTANEOUS | Status: AC
  Administered 2019-10-01: 1
  Filled 2019-10-01: qty 28.35

## 2019-10-01 MED ORDER — SODIUM CHLORIDE 0.9 % IV SOLN
INTRAVENOUS | Status: DC
  Administered 2019-10-01 (×2): via INTRAVENOUS

## 2019-10-01 MED ORDER — SODIUM CHLORIDE 0.9 % IV SOLN: INTRAVENOUS | Status: AC

## 2019-10-01 NOTE — Progress Notes (Signed)
Subjective:  Michelle Graves reports sleeping well last night. She does not have any pain this morning and denies any nausea. Pt reports she has not had a problem like this before. She is not sure what the reason is for her pain and blood in her stool. Pt denies any recent travel or any family history of similar problems to what she is currently feeling.   Objective:  Vital signs in last 24 hours: Vitals:   10/01/19 0013 10/01/19 0044 10/01/19 0500 10/01/19 0531  BP: 110/72 108/66  102/68  Pulse: 61 63  68  Resp: 18 18  17   Temp:  98 F (36.7 C)  98.6 F (37 C)  TempSrc:  Oral  Oral  SpO2: 98% 100%  100%  Weight:  76.2 kg 76.8 kg   Height:  5' 6.5" (1.689 m)     Weight change:   Intake/Output Summary (Last 24 hours) at 10/01/2019 0608 Last data filed at 10/01/2019 14/02/2019 Gross per 24 hour  Intake 437.75 ml  Output 250 ml  Net 187.75 ml   Physical Exam Constitutional:      General: She is not in acute distress.    Comments: Resting comfortably  Cardiovascular:     Rate and Rhythm: Normal rate and regular rhythm.     Heart sounds: Normal heart sounds.  Pulmonary:     Effort: Pulmonary effort is normal. No respiratory distress.     Breath sounds: Normal breath sounds.  Abdominal:     General: Abdomen is flat. Bowel sounds are normal. There is no distension.     Palpations: Abdomen is soft.     Tenderness: There is abdominal tenderness in the right lower quadrant. There is no guarding or rebound.     Comments: Tenderness to deep palpation of RLQ    Labs: Hgb 9.8->8.7, 29.9 -> 26.2 Cr 2.06, GFR 28  Assessment/Plan:  Active Problems:   Colitis  Michelle Graves is a 68 year old female with a medical history of rheumatoid arthritis, history of CVA, CKD 3, CMV colitis during immunosuppressant treatment who presents with abdominal pain and hematochezia, and associated nausea/vomiting, found to have wall thickening of the distal transverse colon with adjacent fat stranding  concerning for infectious colitis vs ulcerative colitis. with GI panel and CMV pending who is also being treated for symptomatic bacteriuria with ceftriaxone.  Colitis: Pt has had recurrent episodes of colitis for the past 6 months, likely multifactorial. Previous positive CMV tests suggest infectious in nature, potentially after incomplete treatment. UC should also be considered, pt is on immunosuppressive therapy for RA. Colonoscopy in June also suggested ischemic colitis, shortly following a CVA she suffered in May. - GI panel and CMV pending; - Started IV metronidazole 500 mg and prophylactic IV gancyclovir 90 mg - Will hold PO megace 400mg /70mL  - GI consult placed - Will consider ID consult pending lab results - Order nutrition consult  Anemia: Hgb 8.7, iron studies pending, T&S completed. Pt not showing sings of active bleeding currently. - Repeat CBC - consdier starting iron or potential transfusion if hgb continues to drop  AKI on CKD 3: Cr 2.74->2.06 improving on fluids. Dehydration from pt's decreased appetite and intake likely cause. - Continue IV NS 150 mL/hr - Repeat CMP in AM  UTI: Pt no longer reporting urinary sx on empiric ceftriaxone - Continue IV ceftriaxone 1 mg   RA: Well-controlled, no acute flares or concerns since admission - continue PO Plaquenil 200 mg  HTN:  Pt's pressures are  in low-normal ranges. - Will hold furosemide 40 mg, amlodipine 10 mg, and metoprolol tartate 100 mg - Continue to monitor  Diet: Clears IVF: D/c IV NS 150 mL/hr VTE PPX: D/C megace 400mg /10 mL    LOS: 1 day   Carin Primrose, Medical Student 10/01/2019, 6:08 AM

## 2019-10-01 NOTE — Plan of Care (Signed)
  Problem: Clinical Measurements: Goal: Ability to maintain clinical measurements within normal limits will improve Outcome: Progressing Goal: Will remain free from infection Outcome: Progressing Goal: Diagnostic test results will improve Outcome: Progressing Goal: Respiratory complications will improve 10/01/2019 2237 by Roselind Rily, RN Outcome: Progressing 10/01/2019 2234 by Roselind Rily, RN Outcome: Progressing Goal: Cardiovascular complication will be avoided 10/01/2019 2237 by Roselind Rily, RN Outcome: Progressing 10/01/2019 2234 by Roselind Rily, RN Outcome: Progressing   Problem: Coping: Goal: Level of anxiety will decrease 10/01/2019 2237 by Roselind Rily, RN Outcome: Progressing 10/01/2019 2234 by Roselind Rily, RN Outcome: Progressing   Problem: Elimination: Goal: Will not experience complications related to urinary retention Outcome: Progressing   Problem: Skin Integrity: Goal: Risk for impaired skin integrity will decrease Outcome: Progressing

## 2019-10-01 NOTE — Evaluation (Signed)
Physical Therapy Evaluation Patient Details Name: Michelle Graves MRN: [id number] DOB: September 05, 1951 Today's Date: 10/01/2019   History of Present Illness  Patient is a 68 year old female who presented to the hospital with reports of bloddy stool and fatigue. She was admitted on 09/30/2019 with Collitis. She has a history of falls. She has had 3 falls over the past few months. She reports her feet just slip out. She started having severe low back pain following one of her falls about a year ago. PMH: diverticulitis, CVA, arthritis , HTN, chest pain  Clinical Impression  Patient presents with decreased endurance with functional mobility. She required supervision for transfers and gait. She reports feeling improved from yesterday. She lives at home alone but her daughter lives around the corner. She has significant lower back pain following a fall a year ago. Her pain tends to increase when she lies flat. She was encouraged to get up and sit in a chair which she did. She is receiving home health at this time. She would benefit from continuing home health. Acute therapy will continue to work on the patient to improve her gait distance and endurance with functional tasks.     Follow Up Recommendations Home health PT may also benefit from a progression to OP therapy for her lower back     Equipment Recommendations       Recommendations for Other Services       Precautions / Restrictions Precautions Precautions: Fall Restrictions Weight Bearing Restrictions: No      Mobility  Bed Mobility Overal bed mobility: Independent             General bed mobility comments: able to sit up at the edge of the bed without assist   Transfers Overall transfer level: Needs assistance Equipment used: Standard walker Transfers: Sit to/from Stand Sit to Stand: Supervision         General transfer comment: supervision for intial balance   Ambulation/Gait Ambulation/Gait assistance:  Supervision Gait Distance (Feet): 15 Feet Assistive device: Rolling walker (2 wheeled) Gait Pattern/deviations: Step-through pattern Gait velocity: decreased Gait velocity interpretation: <1.31 ft/sec, indicative of household ambulator General Gait Details: slow but steady gait pattern   Stairs            Wheelchair Mobility    Modified Rankin (Stroke Patients Only)       Balance Overall balance assessment: Needs assistance Sitting-balance support: No upper extremity supported;Feet unsupported Sitting balance-[name]: Good     Standing balance support: Bilateral upper extremity supported Standing balance-[name]: Poor Standing balance comment: used a walker                              Pertinent Vitals/Pain Pain Assessment: Faces Faces Pain Scale: Hurts a little bit Pain Location: lower back  Pain Descriptors / Indicators: Aching Pain Intervention(s): Limited activity within patient's tolerance;Monitored during session;Patient requesting pain meds-RN notified;RN gave pain meds during session    Home Living Family/patient expects to be discharged to:: Private residence Living Arrangements: Alone Available Help at Discharge: Family;Available 24 hours/day Type of Home: House Home Access: Stairs to enter Entrance Stairs-Rails: None Entrance Stairs-Number of Steps: 2 Home Layout: Able to live on main level with bedroom/bathroom Home Equipment: Walker - 2 wheels;Walker - 4 wheels Additional Comments: Patients daughter lives around the corner     Prior Function Level of Independence: Independent with assistive device(s)  Comments: used walker around the house. Was kliving on her own      Hand Dominance   Dominant Hand: Right    Extremity/Trunk Assessment   Upper Extremity Assessment Upper Extremity Assessment: Defer to OT evaluation    Lower Extremity Assessment Lower Extremity Assessment: Generalized weakness        Communication   Communication: No difficulties  Cognition Arousal/Alertness: Awake/alert Behavior During Therapy: WFL for tasks assessed/performed Overall Cognitive Status: Within Functional Limits for tasks assessed                                        General Comments      Exercises     Assessment/Plan    PT Assessment Patient needs continued PT services  PT Problem List Decreased strength;Decreased range of motion;Decreased activity tolerance;Decreased mobility;Pain       PT Treatment Interventions DME instruction;Gait training;Stair training;Functional mobility training;Therapeutic activities;Therapeutic exercise;Balance training;Neuromuscular re-education;Patient/family education    PT Goals (Current goals can be found in the Care Plan section)  Acute Rehab PT Goals Patient Stated Goal: to get stronger  PT Goal Formulation: With patient Time For Goal Achievement: 10/08/19 Potential to Achieve Goals: Good    Frequency Min 2X/week   Barriers to discharge        Co-evaluation               AM-PAC PT "6 Clicks" Mobility  Outcome Measure Help needed turning from your back to your side while in a flat bed without using bedrails?: None Help needed moving from lying on your back to sitting on the side of a flat bed without using bedrails?: None Help needed moving to and from a bed to a chair (including a wheelchair)?: A Little Help needed standing up from a chair using your arms (e.g., wheelchair or bedside chair)?: A Little Help needed to walk in hospital room?: A Little Help needed climbing 3-5 steps with a railing? : A Lot 6 Click Score: 19    End of Session Equipment Utilized During Treatment: Gait belt Activity Tolerance: Patient tolerated treatment well Patient left: in chair;with call bell/phone within reach;with nursing/sitter in room Nurse Communication: Mobility status PT Visit Diagnosis: Other abnormalities of gait and mobility  (R26.89)    Time: 1240-1300 PT Time Calculation (min) (ACUTE ONLY): 20 min   Charges:   PT Evaluation $PT Eval Moderate Complexity: 1 Mod           Dessie Coma  PT DPT  10/01/2019, 1:40 PM

## 2019-10-01 NOTE — Progress Notes (Signed)
Occupational Therapy Evaluation Patient Details Name: Michelle Graves MRN: [id number] DOB: June 05, 1951 Today's Date: 10/01/2019    History of Present Illness Patient is a 68 year old female who presented to the hospital with reports of bloddy stool and fatigue. She was admitted on 09/30/2019 with Collitis. She has a history of falls. She has had 3 falls over the past few months. She reports her feet just slip out. She started having severe low back pain following one of her falls about a year ago. PMH: diverticulitis, CVA, arthritis , HTN, chest pain   Clinical Impression   Pt presents with above diagnosis. PTA pt PLOF living at home alone Mod I for ADLs and IADLs with use of AE and support from daughter as needed. Pt currently requires assistance with ADLs due to fatigue, weakness, and pain. Pt will benefit from additional skilled OT to address established deficits and to maximize independence prior to return to home environment.    Follow Up Recommendations  Home health OT;Supervision - Intermittent    Equipment Recommendations  3 in 1 bedside commode    Recommendations for Other Services       Precautions / Restrictions Precautions Precautions: Fall Restrictions Weight Bearing Restrictions: No      Mobility Bed Mobility Overal bed mobility: Independent;Needs Assistance Bed Mobility: Sit to Supine       Sit to supine: Min assist   General bed mobility comments: Pt required assistance to manage BLE back in to bed.  Transfers Overall transfer level: Needs assistance Equipment used: Standard walker Transfers: Sit to/from Stand Sit to Stand: Supervision         General transfer comment: supervision for safety     Balance Overall balance assessment: Needs assistance Sitting-balance support: No upper extremity supported;Feet unsupported Sitting balance-[name]: Good     Standing balance support: Bilateral upper extremity supported Standing balance-[name]:  Poor Standing balance comment: used a walker                            ADL either performed or assessed with clinical judgement   ADL Overall ADL's : Needs assistance/impaired Eating/Feeding: Independent;Sitting   Grooming: Independent;Sitting   Upper Body Bathing: Modified independent;Sitting   Lower Body Bathing: Modified independent;Supervison/ safety;Sitting/lateral leans   Upper Body Dressing : Modified independent;Sitting   Lower Body Dressing: Modified independent;Supervision/safety;Sitting/lateral leans   Toilet Transfer: Supervision/safety;RW;Ambulation Toilet Transfer Details (indicate cue type and reason): simulated toilet transfer from recliner back to bed. No physical assistance required only for safety.         Functional mobility during ADLs: Supervision/safety General ADL Comments: Pt limited with some ADLs due to stomach and back pain requiring increased time and AE.     Vision         Perception     Praxis      Pertinent Vitals/Pain Pain Assessment: Faces Faces Pain Scale: Hurts even more Pain Location: lower back  Pain Descriptors / Indicators: Aching Pain Intervention(s): Limited activity within patient's tolerance;Monitored during session;Repositioned     Hand Dominance Right   Extremity/Trunk Assessment Upper Extremity Assessment Upper Extremity Assessment: Generalized weakness   Lower Extremity Assessment Lower Extremity Assessment: Defer to PT evaluation       Communication Communication Communication: No difficulties   Cognition Arousal/Alertness: Awake/alert Behavior During Therapy: WFL for tasks assessed/performed Overall Cognitive Status: Within Functional Limits for tasks assessed  General Comments       Exercises     Shoulder Instructions      Home Living Family/patient expects to be discharged to:: Private residence Living Arrangements:  Alone Available Help at Discharge: Family;Available 24 hours/day Type of Home: House Home Access: Stairs to enter [employer] of Steps: 2 Entrance Stairs-Rails: None Home Layout: Able to live on main level with bedroom/bathroom     Bathroom Shower/Tub: Chief Strategy Officer: Standard Bathroom Accessibility: Yes   Home Equipment: Environmental consultant - 2 wheels;Walker - 4 wheels;Cane - quad;Shower seat   Additional Comments: Patients daughter lives around the corner       Prior Functioning/Environment Level of Independence: Independent with assistive device(s)        Comments: used walker around the house. Was living on her own         OT Problem List: Decreased strength;Decreased activity tolerance;Impaired balance (sitting and/or standing);Decreased safety awareness;Decreased knowledge of use of DME or AE;Pain      OT Treatment/Interventions: Self-care/ADL training;Therapeutic exercise;DME and/or AE instruction;Therapeutic activities;Patient/family education;Balance training    OT Goals(Current goals can be found in the care plan section) Acute Rehab OT Goals Patient Stated Goal: to get stronger  OT Goal Formulation: With patient Time For Goal Achievement: 10/15/19 Potential to Achieve Goals: Fair  OT Frequency: Min 2X/week   Barriers to D/C:            Co-evaluation              AM-PAC OT "6 Clicks" Daily Activity     Outcome Measure Help from another person eating meals?: None Help from another person taking care of personal grooming?: None Help from another person toileting, which includes using toliet, bedpan, or urinal?: None Help from another person bathing (including washing, rinsing, drying)?: A Little Help from another person to put on and taking off regular upper body clothing?: None Help from another person to put on and taking off regular lower body clothing?: A Little 6 Click Score: 22   End of Session Equipment Utilized During  Treatment: Rolling walker Nurse Communication: Mobility status  Activity Tolerance: Patient limited by pain Patient left: in bed;with call bell/phone within reach  OT Visit Diagnosis: Unsteadiness on feet (R26.81);Pain Pain - part of body: (lower back and stomach)                Time: 1515-1530 OT Time Calculation (min): 15 min Charges:  OT General Charges $OT Visit: 1 Visit OT Evaluation $OT Eval Low Complexity: 1 Low  Marquette Old, MSOT, OTR/L  Supplemental Rehabilitation Services  [phone]   Zigmund Daniel 10/01/2019, 3:39 PM

## 2019-10-01 NOTE — Consult Note (Signed)
[facility] Consultation Note  [facility]:  [facility] Primary Gastroenterologist:  Dr. Levora AngelBrahmbhatt  Reason for Consultation:  Abdominal pain, blood in stools  HPI: Michelle Graves is a 68 y.o. female presenting above reasons.  Patient recent extensive hospitalization after fall, with subsequent rehab stay.  Had been home about 1.5 months, at which point she began having constipation and crampy abdominal pain.  Then couple days ago developed some red blood in stool; occasional vomiting, some nausea; no melena.  With persistent bleeding, she went to ED.  CT scan showed some colitis in distal transverse colon.  Had bowel movement yesterday.  Since her admission she feels much better.  On clopidigrel for history of stroke.  Endoscopy and colonoscopy done about 6 months ago showed gastric ulcers and colonic changes of ischemic colitis.  While pain and blood in stool have resolved, she states over the past few months she's lost about 25 lbs.   Past Medical History:  Diagnosis Date  . Arthritis   . Chest pain   . Diverticulitis   . Hypertension   . Stroke [facility](HCC)     Past Surgical History:  Procedure Laterality Date  . BIOPSY  04/01/2019   Procedure: BIOPSY;  Surgeon: Kathi DerBrahmbhatt, Parag, MD;  Location: [facility] ENDOSCOPY;  Service: Gastroenterology;;  . COLONOSCOPY WITH PROPOFOL N/A 04/01/2019   Procedure: COLONOSCOPY WITH PROPOFOL;  Surgeon: Kathi DerBrahmbhatt, Parag, MD;  Location: [facility] ENDOSCOPY;  Service: Gastroenterology;  Laterality: N/A;  . ESOPHAGOGASTRODUODENOSCOPY (EGD) WITH PROPOFOL N/A 04/01/2019   Procedure: ESOPHAGOGASTRODUODENOSCOPY (EGD) WITH PROPOFOL;  Surgeon: Kathi DerBrahmbhatt, Parag, MD;  Location: [facility] ENDOSCOPY;  Service: Gastroenterology;  Laterality: N/A;  . TONSILLECTOMY      Prior to Admission medications   Medication Sig Start Date End Date Taking? Authorizing Provider  amLODipine (NORVASC) 10 MG tablet Take 10 mg  by mouth daily. 08/19/19  Yes [provider]  aspirin EC 81 MG EC tablet Take 1 tablet (81 mg total) by mouth daily. 05/16/19  Yes Verdene LennertBasaraba, Iulia, MD  cloNIDine (CATAPRES) 0.3 MG tablet Take 0.3 mg by mouth 2 (two) times daily. 09/27/19  Yes [provider]  clopidogrel (PLAVIX) 75 MG tablet Take 1 tablet (75 mg total) by mouth daily. 04/05/19  Yes Glade LloydAlekh, Kshitiz, MD  colestipol (COLESTID) 1 g tablet Take 1 tablet (1 g total) by mouth 2 (two) times daily. 05/16/19  Yes Verdene LennertBasaraba, Iulia, MD  doxepin (SINEQUAN) 10 MG capsule Take 10 mg by mouth at bedtime. 09/27/19  Yes [provider]  DULoxetine (CYMBALTA) 60 MG capsule Take 60 mg by mouth at bedtime.    Yes [provider]  feeding supplement, ENSURE ENLIVE, (ENSURE ENLIVE) LIQD Take 237 mLs by mouth 3 (three) times daily between meals. 06/02/19  Yes Shahmehdi, Seyed A, MD  hydrochlorothiazide (HYDRODIURIL) 25 MG tablet Take 25 mg by mouth daily. 09/27/19  Yes [provider]  hydroxychloroquine (PLAQUENIL) 200 MG tablet Take 200 mg by mouth 2 (two) times daily.  06/16/16  Yes [provider]  lisinopril (ZESTRIL) 40 MG tablet Take 40 mg by mouth daily. 08/16/19  Yes [provider]  megestrol (MEGACE) 400 MG/10ML suspension Take 10 mLs (400 mg total) by mouth 2 (two) times daily. 06/02/19  Yes Shahmehdi, Seyed A, MD  metoprolol tartrate (LOPRESSOR) 100 MG tablet Take 100 mg by mouth daily.  11/19/15  Yes [provider]  Multiple Vitamin (MULTIVITAMIN WITH MINERALS) TABS tablet Take 1 tablet by mouth daily.  06/03/19  Yes Shahmehdi, Seyed A, MD  omeprazole (PRILOSEC) 20 MG capsule Take 20 mg by mouth as needed (acid reflux).  08/22/19  Yes [provider]  ondansetron (ZOFRAN) 4 MG tablet Take 1 tablet (4 mg total) by mouth every 6 (six) hours as needed for nausea. Patient taking differently: Take 4 mg by mouth every 6 (six) hours as needed for nausea or vomiting.  04/04/19  Yes Aline August, MD  oxybutynin (DITROPAN-XL) 10 MG 24 hr tablet Take 10 mg by mouth at bedtime. 09/27/19  Yes [provider]  prazosin (MINIPRESS) 1 MG capsule Take 1 mg by mouth at bedtime. 09/20/19  Yes [provider]  tiZANidine (ZANAFLEX) 4 MG tablet Take 4 mg by mouth 2 (two) times daily. 08/22/19  Yes [provider]  docusate sodium (COLACE) 100 MG capsule Take 1 capsule (100 mg total) by mouth 2 (two) times daily. Patient not taking: Reported on 09/30/2019 06/02/19   Deatra James, MD  furosemide (LASIX) 40 MG tablet Take 1 tablet (40 mg total) by mouth daily. Patient not taking: Reported on 09/30/2019 04/05/19   Aline August, MD  pantoprazole (PROTONIX) 40 MG tablet Take 1 tablet (40 mg total) by mouth 2 (two) times daily. Patient not taking: Reported on 09/30/2019 04/04/19   Aline August, MD    Current Facility-Administered Medications  Medication Dose Route Frequency Provider Last Rate Last Dose  . 0.9 %  sodium chloride infusion   Intravenous Continuous Seawell, Jaimie A, DO 150 mL/hr at 10/01/19 0058    . colestipol (COLESTID) tablet 1 g  1 g Oral BID Al Decant, MD   1 g at 10/01/19 0951  . doxepin (SINEQUAN) capsule 10 mg  10 mg Oral QHS Seawell, Jaimie A, DO   10 mg at 10/01/19 0009  . DULoxetine (CYMBALTA) DR capsule 30 mg  30 mg Oral QHS Seawell, Jaimie A, DO      . feeding supplement (ENSURE ENLIVE) (ENSURE ENLIVE) liquid 237 mL  237 mL Oral TID BM Seawell, Jaimie A, DO      . ganciclovir (CYTOVENE) 90 mg in sodium chloride 0.9 % 100 mL IVPB  1.25 mg/kg Intravenous Daily Seawell, Jaimie A, DO 100 mL/hr at 10/01/19 0825 90 mg at 10/01/19 0825  . hydroxychloroquine (PLAQUENIL) tablet 200 mg  200 mg Oral BID Seawell, Jaimie A, DO   200 mg at 10/01/19 0952  . lidocaine (XYLOCAINE) 2 % jelly 1 application  1 application Topical H0W PRN Seawell, Jaimie A, DO      . megestrol (MEGACE) 400 MG/10ML suspension 400 mg  400 mg Oral BID Seawell, Jaimie A, DO   400  mg at 10/01/19 0951  . metroNIDAZOLE (FLAGYL) IVPB 500 mg  500 mg Intravenous Q8H Seawell, Jaimie A, DO 100 mL/hr at 10/01/19 0537 500 mg at 10/01/19 0537  . ondansetron (ZOFRAN) tablet 4 mg  4 mg Oral Q6H PRN Seawell, Jaimie A, DO       Or  . ondansetron (ZOFRAN) injection 4 mg  4 mg Intravenous Q6H PRN Seawell, Jaimie A, DO      . oxyCODONE-acetaminophen (PERCOCET/ROXICET) 5-325 MG per tablet 1 tablet  1 tablet Oral Q8H PRN Al Decant, MD      . pantoprazole (PROTONIX) EC tablet 40 mg  40 mg Oral Daily Seawell, Jaimie A, DO   40 mg at 10/01/19 0952  . senna-docusate (Senokot-S) tablet 1 tablet  1 tablet Oral QHS PRN Seawell, Jaimie A, DO      .  sodium chloride flush (NS) 0.9 % injection 3 mL  3 mL Intravenous Once Nanavati, Ankit, MD      . white petrolatum (VASELINE) gel             Allergies as of 09/30/2019  . (No Known Allergies)    Family History  Problem Relation Age of Onset  . Hypertension Mother   . Diabetes Mother   . CAD Father        died of MI at age 87  . Hypertension Father   . Diabetes Sister   . Diabetes Sister   . Kidney disease Neg Hx     Social History   Socioeconomic History  . Marital status: Divorced    Spouse name: Not on file  . Number of children: Not on file  . Years of education: Not on file  . Highest education level: Not on file  Occupational History  . Not on file  Social Needs  . Financial resource strain: Not on file  . Food insecurity    Worry: Not on file    Inability: Not on file  . Transportation needs    Medical: Not on file    Non-medical: Not on file  Tobacco Use  . Smoking status: Never Smoker  . Smokeless tobacco: Never Used  Substance and Sexual Activity  . Alcohol use: Yes    Alcohol/week: 21.0 standard drinks    Types: 21 Glasses of wine per week    Comment: sometimes   . Drug use: No  . Sexual activity: Yes    Partners: Male    Birth control/protection: Condom  Lifestyle  . Physical activity    Days per  week: Not on file    Minutes per session: Not on file  . Stress: Not on file  Relationships  . Social Musician on phone: Not on file    Gets together: Not on file    Attends religious service: Not on file    Active member of club or organization: Not on file    Attends meetings of clubs or organizations: Not on file    Relationship status: Not on file  . Intimate partner violence    Fear of current or ex partner: Not on file    Emotionally abused: Not on file    Physically abused: Not on file    Forced sexual activity: Not on file  Other Topics Concern  . Not on file  Social History Narrative  . Not on file    Review of Systems: As per HPI, all others negative  Physical Exam: Vital signs in last 24 hours: Temp:  [97.7 F (36.5 C)-98.6 F (37 C)] 98.6 F (37 C) (12/05 0531) Pulse Rate:  [54-68] 68 (12/05 0531) Resp:  [13-22] 17 (12/05 0531) BP: (74-131)/(55-82) 102/68 (12/05 0531) SpO2:  [98 %-100 %] 100 % (12/05 0531) Weight:  [73.5 kg-76.8 kg] 76.8 kg (12/05 0500) Last BM Date: 09/30/19 General:   Alert,  Well-developed, well-nourished, pleasant and cooperative in NAD Head:  Normocephalic and atraumatic. Eyes:  Sclera clear, no icterus.   Conjunctiva pink. Ears:  Normal auditory acuity. Nose:  No deformity, discharge,  or lesions. Mouth:  No deformity or lesions.  Oropharynx pink & moist. Neck:  Supple; no masses or thyromegaly. Lungs:  Clear throughout to auscultation.   No wheezes, crackles, or rhonchi. No acute distress. Heart:  Regular rate and rhythm; no murmurs, clicks, rubs,  or gallops. Abdomen:  Soft,mild protuberant,  minimal tenderness, no peritonitis. No masses, hepatosplenomegaly or hernias noted. Normal bowel sounds, without guarding, and without rebound.     Msk:  Symmetrical without gross deformities. Normal posture. Pulses:  Normal pulses noted. Extremities:  Without clubbing or edema. Neurologic:  Alert and  oriented x4; diffusely weak,  otherwise grossly normal neurologically. Skin:  Intact without significant lesions or rashes. Psych:  Alert and cooperative. Normal mood and affect.   Lab Results: Recent Labs    09/30/19 1627 10/01/19 0119 10/01/19 0607  WBC 8.3 6.0 6.6  HGB 9.8* 8.7* 8.7*  HCT 29.9* 26.2* 27.3*  PLT 227 215 239   BMET Recent Labs    09/30/19 1440 10/01/19 0119  NA 140 139  K 4.0 4.0  CL 104 108  CO2 24 22  GLUCOSE 106* 120*  BUN 26* 23  CREATININE 2.74* 2.06*  CALCIUM 9.8 8.8*   LFT Recent Labs    10/01/19 0119  PROT 5.9*  ALBUMIN 3.0*  AST 12*  ALT 10  ALKPHOS 49  BILITOT 0.4   PT/INR No results for input(s): LABPROT, INR in the last 72 hours.  Studies/Results: Ct Abdomen Pelvis Wo Contrast  Result Date: 09/30/2019 CLINICAL DATA:  History of CMV colitis. Blood in stool. Rectal pain with passing of bowel movement. EXAM: CT ABDOMEN AND PELVIS WITHOUT CONTRAST TECHNIQUE: Multidetector CT imaging of the abdomen and pelvis was performed following the standard protocol without IV contrast. COMPARISON:  Mar 25, 2019 FINDINGS: Lower chest: Coronary artery calcifications. The lung bases are otherwise stable. Hepatobiliary: The gallbladder is poorly distended limiting evaluation. The gallbladder wall appears somewhat prominent/thickened without significant adjacent stranding. The liver is grossly unremarkable on unenhanced images. Pancreas: Unremarkable. No pancreatic ductal dilatation or surrounding inflammatory changes. Spleen: Normal in size without focal abnormality. Adrenals/Urinary Tract: Adrenal glands are normal. No renal stones identified. No hydronephrosis or perinephric stranding. Ureters and bladder are normal. Stomach/Bowel: The stomach and small bowel are normal. Colonic diverticuli are identified. There is wall thickening associated with the distal transverse colon. There is pericolonic fat stranding in this region. No pneumatosis. There is moderate fecal loading proximal to the  region of inflamed colon. No diverticuli are seen in the region of inflamed colon. The appendix is normal. Vascular/Lymphatic: Mild atherosclerotic changes are seen in the nonaneurysmal aorta. No adenopathy. Reproductive: Uterus and bilateral adnexa are unremarkable. Other: No abdominal wall hernia or abnormality. No abdominopelvic ascites. Musculoskeletal: No acute or significant osseous findings. IMPRESSION: 1. There is wall thickening associated with the distal transverse colon with adjacent fat stranding and no pneumatosis or evidence of perforation. The findings are consistent with colitis which could be infectious, inflammatory, or vascular. While the patient does have diverticuli, the findings on today's study are not thought to be due to diverticulitis due to the lack of diverticuli in the region of inflammation. 2. The gallbladder wall is prominent/somewhat thickened. This could be due to lack of distention. Recommend clinical correlation. 3. Mild atherosclerotic changes in the nonaneurysmal aorta. Electronically Signed   By: Gerome Samavid  Williams III M.D   On: 09/30/2019 19:28   [place] Renal  Result Date: 09/30/2019 CLINICAL DATA:  Acute renal insufficiency EXAM: RENAL / URINARY TRACT ULTRASOUND COMPLETE COMPARISON:  CT scan September 30, 2019. Renal ultrasound Mar 24, 2019. FINDINGS: Right Kidney: Renal measurements: 7.8 x 3.9 x 5.3 cm = volume: 83.7 mL . Echogenicity within normal limits. No mass or hydronephrosis visualized. Left Kidney: Renal measurements: 9.5 x 4.2 x 4.6 cm = volume: 92.9 mL.  Echogenicity within normal limits. No mass or hydronephrosis visualized. Bladder: Appears normal for degree of bladder distention. Other: None. IMPRESSION: 1. The right kidney measured 7.8 cm today which would be small. However, the right kidney measured 10.6 cm on the Mar 24, 2019 ultrasound and appeared to be normal in size on today's CT scan. Today's measurement of the right kidney is artifactual. 2. No acute  abnormalities. Electronically Signed   By: Gerome Sam III M.D   On: 09/30/2019 23:08   Impression:  1.  Bloody stools, resolved. 2.  Abdominal pain, resolved. 3.  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 4.  Chronic anticoagulation, clopidigrel, now on hold. 5.  Constipation. 6.  Weight loss. 7.  Recent fall with lengthy rehab stay, has to use walker for ambulation. 8.  Anemia, likely multifactorial.  Plan:  1.  Hold anticoagulation.  IVF, serial CBCs, transfuse PRBCs as needed. 2.  Trial clear liquids, advance to full liquids tomorrow if she continues to improve today. 3.  Medically manage over weekend; [facility] will revisit Monday to discuss whether or not we should pursue colonoscopy during this admission or electively in the next several weeks.   LOS: 1 day   Ermina Oberman M  10/01/2019, 10:33 AM  Cell [phone] If no answer or after 5 PM call [phone]

## 2019-10-01 NOTE — Progress Notes (Signed)
Initial Nutrition Assessment  DOCUMENTATION CODES:   Not applicable  INTERVENTION:  -Boost Breeze po TID, each supplement provides 250 kcal and 9 grams of protein -MVI with minerals -Advance diet as medically feasible  -Discontinue Ensure Enlive po TID, supplement inappropriate for current CL diet order  NUTRITION DIAGNOSIS:   Inadequate oral intake related to altered GI function(colitis; transverse colon; suspected recurrent ischemic colitis) as evidenced by percent weight loss(CL diet order insufficient to meet needs).  GOAL:   Patient will meet greater than or equal to 90% of their needs   MONITOR:   PO intake, Labs, I & O's, Supplement acceptance, Weight trends, Diet advancement  REASON FOR ASSESSMENT:   Malnutrition Screening Tool    ASSESSMENT:  RD working remotely.  68 year old female with past medical history of RA, DDD, history of CVA, HTN, CKD3, diverticulitis and history of CMV colitis who presented to ED with 2 days of bloody bowel movements with nausea, vomiting, and abdominal pain. Patient endorses fatigue, recent falls in the past month, and 25 lb wt loss in the past 3 months. CT abdomen pelvis concerning for infectious colitis.  Per chart review, bloody stools resolved. GI noted CT scan with colitis of transverse colon; suspected recurrent ischemic colitis. Trial of clear liquids, advance to full liquids tomorrow with continued improvement. GI to revisit Monday to discuss inpatient verses elective colonoscopy in the next couple of weeks. Will continue to monitor for diet advancement and provide Boost Breeze to aid with calorie/protien needs.   Unable to reach patient via phone this morning to obtain nutrition history. Per chart review, she was admitted in July with LE muscle weakness s/p fall at home. Patient with reported poor po intake throughout admission. Weight at that time 89.9 kg. Patient admitted again in August with AKI secondary to poor oral intake  related to recurrent nausea and vomiting with eating and noted significant wt loss. Patient provided Ensure and Magic cup nutrition supplements and Megace initiated at that time.   Current wt 76.8 kg (170 lb) Weight trending down over the past year, noted 15.9 lb (8.6%) wt loss in the past 4 months which is significant for time frame.   I/Os: +187 ml since admit UOP: 437 ml since admit  Medications reviewed and include: Megace 400 mg BID, Protonix 40 mg daily, NaCl @ 150 ml/hr, Cytovene 90 mg IVPB, Flagyl 500 mg IVPB Labs: Cr 2.06 (H), Mg 1.4 (L), Hgb 8.7 (L)   NUTRITION - FOCUSED PHYSICAL EXAM: Unable to complete at this time, RD working remotely.  Diet Order:   Diet Order            Diet clear liquid Room service appropriate? Yes; Fluid consistency: Thin  Diet effective now              EDUCATION NEEDS:   No education needs have been identified at this time  Skin:  Skin Assessment: Reviewed RN Assessment  Last BM:  12/4  Height:   Ht Readings from Last 1 Encounters:  10/01/19 5' 6.5" (1.689 m)    Weight:   Wt Readings from Last 1 Encounters:  10/01/19 76.8 kg    Ideal Body Weight:  60.5 kg  BMI:  Body mass index is 26.92 kg/m.  Estimated Nutritional Needs:   Kcal:  0539-7673  Protein:  80-88  Fluid:  >/= 1.6 L/day   Lajuan Lines, RD, LDN Clinical Nutrition [facility] Telephone [phone] After Hours/Weekend Pager: [phone]

## 2019-10-01 NOTE — Plan of Care (Signed)
°  Problem: Clinical Measurements: °Goal: Ability to maintain clinical measurements within normal limits will improve °Outcome: Progressing °Goal: Will remain free from infection °Outcome: Progressing °Goal: Diagnostic test results will improve °Outcome: Progressing °Goal: Respiratory complications will improve °Outcome: Progressing °Goal: Cardiovascular complication will be avoided °Outcome: Progressing °  °Problem: Coping: °Goal: Level of anxiety will decrease °Outcome: Progressing °  °

## 2019-10-02 LAB — CBC
HCT: 25.5 % — ABNORMAL LOW (ref 36.0–46.0)
Hemoglobin: 8.2 g/dL — ABNORMAL LOW (ref 12.0–15.0)
MCH: 29.8 pg (ref 26.0–34.0)
MCHC: 32.2 g/dL (ref 30.0–36.0)
MCV: 92.7 fL (ref 80.0–100.0)
Platelets: 243 10*3/uL (ref 150–400)
RBC: 2.75 MIL/uL — ABNORMAL LOW (ref 3.87–5.11)
RDW: 12.9 % (ref 11.5–15.5)
WBC: 5.6 10*3/uL (ref 4.0–10.5)
nRBC: 0 % (ref 0.0–0.2)

## 2019-10-02 LAB — BASIC METABOLIC PANEL
Anion gap: 9 (ref 5–15)
BUN: 16 mg/dL (ref 8–23)
CO2: 20 mmol/L — ABNORMAL LOW (ref 22–32)
Calcium: 8.6 mg/dL — ABNORMAL LOW (ref 8.9–10.3)
Chloride: 108 mmol/L (ref 98–111)
Creatinine, Ser: 1.61 mg/dL — ABNORMAL HIGH (ref 0.44–1.00)
GFR calc Af Amer: 38 mL/min — ABNORMAL LOW (ref >60)
GFR calc non Af Amer: 33 mL/min — ABNORMAL LOW (ref >60)
Glucose, Bld: 106 mg/dL — ABNORMAL HIGH (ref 70–99)
Potassium: 3.3 mmol/L — ABNORMAL LOW (ref 3.5–5.1)
Sodium: 137 mmol/L (ref 135–145)

## 2019-10-02 NOTE — Progress Notes (Signed)
  Subjective:  Patient seen at bedside this AM. Patient states she does not have stomach pain but has had back pain. Patient informed of PRN percocet which she can ask for. Patient states she does not like the food she has been eating but has been tolerating the clear liquid diet without issue. Patient states last bowel movement was 2 days ago which was non-bloody.   Objective:    Vital Signs (last 24 hours): Vitals:   10/01/19 0531 10/01/19 1559 10/01/19 2025 10/02/19 0524  BP: 102/68 102/68 112/67 122/69  Pulse: 68 62 61 67  Resp: 17 20 20 20   Temp: 98.6 F (37 C) 98.2 F (36.8 C) 98.3 F (36.8 C) 98.3 F (36.8 C)  TempSrc: Oral Oral    SpO2: 100% 100% 100% 98%  Weight:      Height:        Physical Exam: General Alert and answers questions appropriately, no acute distress  Cardiac Regular rate and rhythm, no murmurs, rubs, or gallops  Pulmonary Clear to auscultation bilaterally without wheezes, rhonchi, or rales  Abdominal Soft, non-tender, without distention  Extremities No peripheral edema    CBC Latest Ref Rng & Units 10/02/2019 10/01/2019 10/01/2019  WBC 4.0 - 10.5 K/uL 5.6 6.6 6.0  Hemoglobin 12.0 - 15.0 g/dL 8.2(L) 8.7(L) 8.7(L)  Hematocrit 36.0 - 46.0 % 25.5(L) 27.3(L) 26.2(L)  Platelets 150 - 400 K/uL 243 239 215   BMP Latest Ref Rng & Units 10/02/2019 10/01/2019 09/30/2019  Glucose 70 - 99 mg/dL 106(H) 120(H) 106(H)  BUN 8 - 23 mg/dL 16 23 26(H)  Creatinine 0.44 - 1.00 mg/dL 1.61(H) 2.06(H) 2.74(H)  Sodium 135 - 145 mmol/L 137 139 140  Potassium 3.5 - 5.1 mmol/L 3.3(L) 4.0 4.0  Chloride 98 - 111 mmol/L 108 108 104  CO2 22 - 32 mmol/L 20(L) 22 24  Calcium 8.9 - 10.3 mg/dL 8.6(L) 8.8(L) 9.8      Assessment/Plan:   Active Problems:   Colitis  Patient is a 68 year old female with past medical history of rheumatoid arthritis, CVA, CKD stage III, CMV colitis during immunosuppressant treatment who presented on 12/4 with abdominal pain and hematochezia, and  associated nausea/vomiting who was found to have CT findings of colitis.  # Colitis: Patient with recurrent episodes of colitis over the past 6 months.  Etiology is is likely ischemic versus infectious.  Patient had previously positive CMV test, followed by negative test status post treatment.  Colonoscopy in June also suggested ischemic colitis. *GI panel and CMV pending *Patient remains on metronidazole and ganciclovir, day 3 of anti-infective therapy *GI following patient, we appreciate their continued recommendations  # Normocytic Anemia: Hemoglobin of 8.2, from 8.7 yesterday.  Patient remains without signs of active bleeding.  Iron studies show low-normal iron of 37, low TIBC of 231, mildly increased ferritin of 365 - findings consistent with anemia of inflammation. * We will continue to follow  # AKI on CKD: Creatinine continue to improve with IV fluids. Creatinine: 2.74 -> 2.06 -> 1.61 (today).  Creatinine from August of 1.91.  # HTN: Blood pressure remains in low-normal ranges, with systolics from 295-284 over past 24 hours. * We will continue to hold furosemide, amlodipine, and metoprolol  Diet: Full liquid diet, advanced from clear liquid DVT Ppx: SCDs, No pharmacologic DVT prophylaxis due to hematochezia.  Will continue to hold Megace due to its prothrombotic risk. Dispo: Anticipated discharge pending clinical improvement  Jeanmarie Hubert, MD 10/02/2019, 6:02 AM Pager: [phone]

## 2019-10-03 ENCOUNTER — Inpatient Hospital Stay (HOSPITAL_COMMUNITY): Payer: Medicare Other | Admitting: Certified Registered Nurse Anesthetist

## 2019-10-03 ENCOUNTER — Ambulatory Visit: Payer: Self-pay | Admitting: Adult Health

## 2019-10-03 DIAGNOSIS — E440 Moderate protein-calorie malnutrition: Secondary | ICD-10-CM | POA: Insufficient documentation

## 2019-10-03 DIAGNOSIS — N183 Chronic kidney disease, stage 3 unspecified: Secondary | ICD-10-CM

## 2019-10-03 DIAGNOSIS — N179 Acute kidney failure, unspecified: Secondary | ICD-10-CM

## 2019-10-03 DIAGNOSIS — D649 Anemia, unspecified: Secondary | ICD-10-CM

## 2019-10-03 DIAGNOSIS — M549 Dorsalgia, unspecified: Secondary | ICD-10-CM

## 2019-10-03 DIAGNOSIS — M069 Rheumatoid arthritis, unspecified: Secondary | ICD-10-CM

## 2019-10-03 DIAGNOSIS — E876 Hypokalemia: Secondary | ICD-10-CM

## 2019-10-03 DIAGNOSIS — Z79899 Other long term (current) drug therapy: Secondary | ICD-10-CM

## 2019-10-03 DIAGNOSIS — I129 Hypertensive chronic kidney disease with stage 1 through stage 4 chronic kidney disease, or unspecified chronic kidney disease: Secondary | ICD-10-CM

## 2019-10-03 DIAGNOSIS — D631 Anemia in chronic kidney disease: Secondary | ICD-10-CM

## 2019-10-03 DIAGNOSIS — K529 Noninfective gastroenteritis and colitis, unspecified: Secondary | ICD-10-CM

## 2019-10-03 DIAGNOSIS — Z8673 Personal history of transient ischemic attack (TIA), and cerebral infarction without residual deficits: Secondary | ICD-10-CM

## 2019-10-03 LAB — CBC
HCT: 28.2 % — ABNORMAL LOW (ref 36.0–46.0)
Hemoglobin: 9.1 g/dL — ABNORMAL LOW (ref 12.0–15.0)
MCH: 30 pg (ref 26.0–34.0)
MCHC: 32.3 g/dL (ref 30.0–36.0)
MCV: 93.1 fL (ref 80.0–100.0)
Platelets: 275 10*3/uL (ref 150–400)
RBC: 3.03 MIL/uL — ABNORMAL LOW (ref 3.87–5.11)
RDW: 13 % (ref 11.5–15.5)
WBC: 6.6 10*3/uL (ref 4.0–10.5)
nRBC: 0 % (ref 0.0–0.2)

## 2019-10-03 LAB — URINE CULTURE: Culture: >=100000 — AB

## 2019-10-03 LAB — BASIC METABOLIC PANEL
Anion gap: 11 (ref 5–15)
BUN: 12 mg/dL (ref 8–23)
CO2: 19 mmol/L — ABNORMAL LOW (ref 22–32)
Calcium: 8.8 mg/dL — ABNORMAL LOW (ref 8.9–10.3)
Chloride: 110 mmol/L (ref 98–111)
Creatinine, Ser: 1.41 mg/dL — ABNORMAL HIGH (ref 0.44–1.00)
GFR calc Af Amer: 45 mL/min — ABNORMAL LOW (ref >60)
GFR calc non Af Amer: 39 mL/min — ABNORMAL LOW (ref >60)
Glucose, Bld: 104 mg/dL — ABNORMAL HIGH (ref 70–99)
Potassium: 3.1 mmol/L — ABNORMAL LOW (ref 3.5–5.1)
Sodium: 140 mmol/L (ref 135–145)

## 2019-10-03 MED ORDER — BISACODYL 5 MG PO TBEC
10.0000 mg | DELAYED_RELEASE_TABLET | Freq: Once | ORAL | Status: AC
  Administered 2019-10-03: 10 mg via ORAL
  Filled 2019-10-03: qty 2

## 2019-10-03 MED ORDER — PEG 3350-KCL-NA BICARB-NACL 420 G PO SOLR
4000.0000 mL | Freq: Once | ORAL | Status: AC
  Administered 2019-10-03: 4000 mL via ORAL
  Filled 2019-10-03: qty 4000

## 2019-10-03 MED ORDER — POTASSIUM CHLORIDE CRYS ER 20 MEQ PO TBCR
40.0000 meq | EXTENDED_RELEASE_TABLET | Freq: Once | ORAL | Status: AC
  Administered 2019-10-03: 40 meq via ORAL
  Filled 2019-10-03: qty 2

## 2019-10-03 NOTE — Progress Notes (Addendum)
Subjective:  No events overnight. [name] reports feeling better today, "but still not at my best." She endorses some back pain on her left side that improves with percocet. Pt denies any abdominal pain and reports her last bowel movement was three days ago.   Objective:  Vital signs in last 24 hours: Vitals:   10/02/19 0524 10/02/19 1405 10/02/19 2045 10/03/19 0454  BP: 122/69 127/80 (!) 142/82 (!) 140/93  Pulse: 67 76 79 81  Resp: 20 17 20 17   Temp: 98.3 F (36.8 C) 99.1 F (37.3 C) 98.4 F (36.9 C) 99 F (37.2 C)  TempSrc:  Oral  Oral  SpO2: 98% 100% 97% 99%  Weight:      Height:       Weight change:   Intake/Output Summary (Last 24 hours) at 10/03/2019 1408 Last data filed at 10/03/2019 0501 Gross per 24 hour  Intake 340 ml  Output 1400 ml  Net -1060 ml   Physical Exam Constitutional:      General: She is not in acute distress. Cardiovascular:     Rate and Rhythm: Normal rate and regular rhythm.     Heart sounds: Normal heart sounds.  Pulmonary:     Effort: Pulmonary effort is normal.  Abdominal:     General: Abdomen is flat. There is no distension.     Palpations: Abdomen is soft.     Tenderness: There is abdominal tenderness in the right lower quadrant.  Neurological:     General: No focal deficit present.     Mental Status: She is alert.  Psychiatric:        Mood and Affect: Mood normal.    Labs: Hgb: 8.2->9.1, Hct 25.5->28.2 K 3.3->3.1, Ca 8.6->8.8, CO2, 20->19, GFR: 38->45, Cr 1.61->1.41,  Assessment/Plan:  Principal Problem:   Colitis Active Problems:   HYPERTENSION, MALIGNANT ESSENTIAL   AKI (acute kidney injury) ([place])   Anemia, chronic disease  [name] is a 68 year old female with a medical history of rheumatoid arthritis, history of CVA, CKD 3 and CMV colitis during immunosuppressant treatment who presents with abdominal pain and hematochezia, and associated nausea/vomiting, found to have wall thickening of he transverse colon and  adjacent fat stranding concerning for ischemic vs. Infectious colitis with a GI panel and CMV pending, while concurrently being treated for symptomatic bacteriuria with ceftriaxone. AKI is improving. Hypokalemia worsening, hypocalcemia improving.   Colitis: Based on new different presentation compared to past episodes, recent hx of CVA, and prior colonoscopy results, there is high suspicion for ischemic colitis. Infectious colitis is less likely since pt ceased immunosuppressant therapy after CMV colitis dx. Ulcerative colitis lower on the differential, but should be considered if other diagnoses are ruled out.  - GI panel and CMV pending; - Conitnue IV metronidazole 500 mg and prophylactic IV gancyclovir 90 mg - GI on board; greatly appreciate recs; planning for EGD and colonoscopy tomorrow - diet is clear liquids until mifnight, then NPO diet until EGD and colonoscopy tomorrow  Anemia: Hcb 8.2->9.1. Improving. Anemia of inflammation most likely due to GI bleed 2/2 colitis.  - Consider iron supplementation if confirmed no bacterial infection  AKI on CKD 3: GFR 38->45, Cr 2.06->1.61->1.41. AKI is Improving. Pt has some mild hypokalemia and hypocalcemia. -  - Ordered one-time dose of potassium chloride 40 mg PO - Pt diet is clears until midnight, then NPO until after EGD/colonscopy procedure tomorrow  HTN: Pt's pressures are mildly elevated. Pt has mild hypokalemia. - Ordered one-time dose of potassium  chloride 40 mg PO - Consider restarting metoprolol tartrate, amlodipine, and lasix following EGD/colonoscopy tomorrow  Diet: Clear liquids until midnight, then NPO through tomorrow until after GI procedure DVT Ppx: SCDs, No pharmacologic DVT prophylaxis due to hematochezia.  Will continue to hold Megace due to its prothrombotic risk. Dispo: Anticipated d/c pending clinical improvement   LOS: 3 days   Frederico Hamman, Medical Student 10/03/2019, 2:08 PM

## 2019-10-03 NOTE — Progress Notes (Signed)
Occupational Therapy Treatment Patient Details Name: Michelle Graves MRN: [id number] DOB: 1951-02-01 Today's Date: 10/03/2019    History of present illness Patient is a 68 year old female who presented to the hospital with reports of bloddy stool and fatigue. She was admitted on 09/30/2019 with Collitis. She has a history of falls. She has had 3 falls over the past few months. She reports her feet just slip out. She started having severe low back pain following one of her falls about a year ago. PMH: diverticulitis, CVA, arthritis , HTN, chest pain   OT comments  Patient semi-supine upon arrival, agreeable to OT. Patient supervision for mobility for safety, patient has history of falls with latest in November. OT educate patient on importance of utilizing AD with ambulation and functional transfers for fall prevention. Patient acknowledges and reports she will also static stand for a few seconds before trying to ambulate to make sure she does not feel dizzy which OT advises patient is another good strategy. Patient agreeable to functional ambulation in her room approx 10 ft before sitting in recliner chair at supervision level with rolling walker. Patient then participate in UB strengthening exercises as patient reports feeling weak after losing significant amount of weight this year.    Follow Up Recommendations  Home health OT;Supervision - Intermittent    Equipment Recommendations  3 in 1 bedside commode       Precautions / Restrictions Precautions Precautions: Fall Precaution Comments: enteric precautions Restrictions Weight Bearing Restrictions: No       Mobility Bed Mobility Overal bed mobility: Modified Independent Bed Mobility: Supine to Sit     Supine to sit: Modified independent (Device/Increase time);HOB elevated        Transfers Overall transfer level: Needs assistance Equipment used: Rolling walker (2 wheeled) Transfers: Sit to/from Stand Sit to Stand:  Supervision         General transfer comment: supervision for safety     Balance Overall balance assessment: Needs assistance;History of Falls Sitting-balance support: No upper extremity supported;Feet unsupported Sitting balance-[name]: Good     Standing balance support: Bilateral upper extremity supported Standing balance-[name]: Poor Standing balance comment: support of walker for safety                           ADL either performed or assessed with clinical judgement   ADL Overall ADL's : Needs assistance/impaired       Grooming Details (indicate cue type and reason): pt reports already performing g/h this AM                 Toilet Transfer: Supervision/safety;RW;Ambulation Toilet Transfer Details (indicate cue type and reason): simulated toielt transfer from edge of bed to recliner, supervision for safety.         Functional mobility during ADLs: Supervision/safety;Rolling walker General ADL Comments: patient ambulate house hold distance approximately 10 ft before sitting in recliner chair. denies shortness of breath. Once seated O2 99% on room air HR 124               Cognition Arousal/Alertness: Awake/alert Behavior During Therapy: WFL for tasks assessed/performed Overall Cognitive Status: Within Functional Limits for tasks assessed                                          Exercises Exercises: General Upper Extremity  General Exercises - Upper Extremity Shoulder Flexion: AROM;Strengthening;20 reps;Seated;Both Shoulder Horizontal ABduction: AROM;Both;20 reps;Seated;Strengthening           Pertinent Vitals/ Pain       Pain Assessment: No/denies pain      Frequency  Min 2X/week        Progress Toward Goals  OT Goals(current goals can now be found in the care plan section)  Progress towards OT goals: Progressing toward goals  Acute Rehab OT Goals Patient Stated Goal: to get stronger  OT Goal  Formulation: With patient Time For Goal Achievement: 10/15/19 Potential to Achieve Goals: Good ADL Goals Pt Will Perform Grooming: with modified independence;sitting Pt Will Perform Lower Body Dressing: with modified independence;with adaptive equipment;sitting/lateral leans Pt Will Transfer to Toilet: with modified independence;ambulating;regular height toilet Pt Will Perform Tub/Shower Transfer: Tub transfer;3 in 1;ambulating;with modified independence  Plan Discharge plan remains appropriate       AM-PAC OT "6 Clicks" Daily Activity     Outcome Measure   Help from another person eating meals?: None Help from another person taking care of personal grooming?: None Help from another person toileting, which includes using toliet, bedpan, or urinal?: A Little Help from another person bathing (including washing, rinsing, drying)?: A Little Help from another person to put on and taking off regular upper body clothing?: None Help from another person to put on and taking off regular lower body clothing?: A Little 6 Click Score: 21    End of Session Equipment Utilized During Treatment: Rolling walker  OT Visit Diagnosis: Unsteadiness on feet (R26.81)   Activity Tolerance Patient tolerated treatment well   Patient Left in chair;with call bell/phone within reach;with chair alarm set   Nurse Communication Mobility status        Time: 4431-5400 OT Time Calculation (min): 27 min  Charges: OT General Charges $OT Visit: 1 Visit OT Treatments $Self Care/Home Management : 8-22 mins $Therapeutic Exercise: 8-22 mins  Michelle Graves OT OT office: [phone]   Carmelia Roller 10/03/2019, 2:04 PM

## 2019-10-03 NOTE — Progress Notes (Signed)
Pt for colonoscopy tomorrow at 0845, clear liquid but NPO midnight, started drinking the golytely, given dulcolax tab but no bowel movement yet at this time.

## 2019-10-03 NOTE — Progress Notes (Addendum)
Nutrition Follow-up  DOCUMENTATION CODES:   Non-severe (moderate) malnutrition in context of chronic illness  INTERVENTION:   -Continue Boost Breeze po TID, each supplement provides 250 kcal and 9 grams of protein -MVI with minerals daily -RD will follow for diet advancement and adjust supplement regimen as appropriate  NUTRITION DIAGNOSIS:   Moderate Malnutrition related to chronic illness(ischemic colitis) as evidenced by percent weight loss, energy intake < or equal to 75% for > or equal to 1 month, mild fat depletion, moderate fat depletion, mild muscle depletion, moderate muscle depletion.  Ongoing  GOAL:   Patient will meet greater than or equal to 90% of their needs  Progressing   MONITOR:   PO intake, Supplement acceptance, Diet advancement, Labs, Weight trends, Skin, I & O's  REASON FOR ASSESSMENT:   Malnutrition Screening Tool    ASSESSMENT:   68 year old female with past medical history of RA, DDD, history of CVA, HTN, CKD3, diverticulitis and history of CMV colitis who presented to ED with 2 days of bloody bowel movements with nausea, vomiting, and abdominal pain. Patient endorses fatigue, recent falls in the past month, and 25 lb wt loss in the past 3 months. CT abdomen pelvis concerning for infectious colitis.  Reviewed I/O's: -1.1 L x 24 hours and -732 ml since admission  UOP: 1.4 L x 24 hours  Spoke with pt, who was sitting in recliner chair at time of visit. Pt with flat affect and expressed frustration about declining functional status and multiple hospitalizations. Pt reports not liking the clear liquid diet and wanting solid food. RD discussed rationale for clear liquid diet, which pt accepted.   Pt reports decline in health over the past 7 months, which started when she was hospitalized for a fall. Since then, pt reports early satiety, nausea, and decreased oral intake. She shares that she has been consuming mainly liquids since this time periods  (typical intake is 2 bottles of Ensure). Pt reports she tries to eat, but often can consume only 1-2 bites of a food item before feeling full and nauseous- food odors often exacerbate this. Observed meal tray- pt consumed 100% of Boost Breeze and 50% of apple juice.   Pt reports her UBW is around 202# and has lost about 80# over the past 6 months. Reviewed wt hx; pt has experienced a 12.6% wt loss over the past 6 months, which is significant for time frame.   Per GI notes, plan for colonoscopy tomorrow.   Pt amenable to Boost Breeze supplement and requesting Ensure (strawberry) with diet advancement. Discussed importance of good meal and supplement intake to promote healing.  Labs reviewed.   NUTRITION - FOCUSED PHYSICAL EXAM:    Most Recent Value  Orbital Region  Mild depletion  Upper Arm Region  Mild depletion  Thoracic and Lumbar Region  No depletion  Buccal Region  Mild depletion  Temple Region  Moderate depletion  Clavicle Bone Region  No depletion  Clavicle and Acromion Bone Region  Mild depletion  Scapular Bone Region  Mild depletion  Dorsal Hand  Moderate depletion  Patellar Region  Mild depletion  Anterior Thigh Region  Mild depletion  Posterior Calf Region  Mild depletion  Edema (RD Assessment)  None  Hair  Reviewed  Eyes  Reviewed  Mouth  Reviewed  Skin  Reviewed  Nails  Reviewed       Diet Order:   Diet Order            Diet NPO  time specified  Diet effective midnight        Diet clear liquid Room service appropriate? Yes; Fluid consistency: Thin  Diet effective now              EDUCATION NEEDS:   Education needs have been addressed  Skin:  Skin Assessment: Reviewed RN Assessment  Last BM:  09/30/19  Height:   Ht Readings from Last 1 Encounters:  10/01/19 5' 6.5" (1.689 m)    Weight:   Wt Readings from Last 1 Encounters:  10/02/19 80.8 kg    Ideal Body Weight:  60.5 kg  BMI:  Body mass index is 28.32 kg/m.  Estimated Nutritional  Needs:   Kcal:  1800-2000  Protein:  90-105 grams  Fluid:  > 1.8 L    Michelle Graves A. Jimmye Norman, RD, LDN, [place] Registered Dietitian II Certified Diabetes Care and Education Specialist Pager: [phone] After hours Pager: [phone]

## 2019-10-03 NOTE — Progress Notes (Signed)
[facility] Gastroenterology Progress Note  Michelle Graves 68 y.o. 1951/07/20  CC: Abdominal pain, colitis   Subjective: Continues to have generalized abdominal discomfort, complaining of lower back pain.  No bowel movement in last 2 days.  Complaining of nausea but denied any vomiting.  ROS : Afebrile.  Negative for acute chest pain   Objective: Vital signs in last 24 hours: Vitals:   10/02/19 2045 10/03/19 0454  BP: (!) 142/82 (!) 140/93  Pulse: 79 81  Resp: 20 17  Temp: 98.4 F (36.9 C) 99 F (37.2 C)  SpO2: 97% 99%    Physical Exam:  General:  Alert, cooperative, no distress, appears stated age  Head:  Normocephalic, without obvious abnormality, atraumatic  Eyes:  , EOM's intact,   Lungs:   Clear to auscultation bilaterally, respirations unlabored  Heart:  Regular rate and rhythm, S1, S2 normal  Abdomen:   Soft, generalized discomfort without any point tenderness, bowel sounds present, no peritoneal signs  Extremities: Extremities normal, atraumatic, no  edema       Lab Results: Recent Labs    10/01/19 0119 10/02/19 0234 10/03/19 0215  NA 139 137 140  K 4.0 3.3* 3.1*  CL 108 108 110  CO2 22 20* 19*  GLUCOSE 120* 106* 104*  BUN 23 16 12   CREATININE 2.06* 1.61* 1.41*  CALCIUM 8.8* 8.6* 8.8*  MG 1.4*  --   --    Recent Labs    09/30/19 1440 10/01/19 0119  AST 14* 12*  ALT 10 10  ALKPHOS 57 49  BILITOT 0.8 0.4  PROT 7.1 5.9*  ALBUMIN 3.7 3.0*   Recent Labs    09/30/19 1627 10/01/19 0119  10/02/19 0234 10/03/19 0215  WBC 8.3 6.0   < > 5.6 6.6  NEUTROABS 4.9 2.8  --   --   --   HGB 9.8* 8.7*   < > 8.2* 9.1*  HCT 29.9* 26.2*   < > 25.5* 28.2*  MCV 93.7 91.9   < > 92.7 93.1  PLT 227 215   < > 243 275   < > = values in this interval not displayed.   No results for input(s): LABPROT, INR in the last 72 hours.    Assessment/Plan: -Abnormal CT scan concerning for colitis.  Colonoscopy in June 2020 showed severe ischemic colitis. -History of  gastric ulcer -Weight loss -Abdominal pain.  Could be from colitis  Recommendations -------------------------- - colonoscopy tomorrow.  We will also add EGD to document resolution of gastric ulcer. -Clear liquid diet today.  N.p.o. past midnight  Risks (bleeding, infection, bowel perforation that could require surgery, sedation-related changes in cardiopulmonary systems), benefits (identification and possible treatment of source of symptoms, exclusion of certain causes of symptoms), and alternatives (watchful waiting, radiographic imaging studies, empiric medical treatment)  were explained to patient/family in detail and patient wishes to proceed.     Otis Brace MD, [place] 10/03/2019, 9:06 AM  Contact #  [phone]

## 2019-10-03 NOTE — Progress Notes (Signed)
Pt while drinking golytely she vomited, no bowel movement at this time.

## 2019-10-03 NOTE — Discharge Summary (Addendum)
Name: Michelle Graves MRN: [id number] DOB: 04/04/1951 68 y.o. PCP: [facility]  Date of Admission: 09/30/2019  1:45 PM Date of Discharge: 10/06/2019 Attending Physician: Lalla Brothers, MD  Discharge Diagnosis: 1. Colitis (chronic) 2. Anemia of inflammatory disease 3. AKI on CKD 3 4. UTI 5. Malnutrition and deconditioning   Discharge Medications: Allergies as of 10/06/2019   No Known Allergies      Medication List     TAKE these medications    amLODipine 10 MG tablet Commonly known as: NORVASC Take 10 mg by mouth daily.   aspirin 81 MG EC tablet Take 1 tablet (81 mg total) by mouth daily.   cloNIDine 0.3 MG tablet Commonly known as: CATAPRES Take 0.3 mg by mouth 2 (two) times daily.   clopidogrel 75 MG tablet Commonly known as: PLAVIX Take 1 tablet (75 mg total) by mouth daily.   colestipol 1 g tablet Commonly known as: COLESTID Take 1 tablet (1 g total) by mouth 2 (two) times daily.   docusate sodium 100 MG capsule Commonly known as: COLACE Take 1 capsule (100 mg total) by mouth 2 (two) times daily.   doxepin 10 MG capsule Commonly known as: SINEQUAN Take 10 mg by mouth at bedtime.   DULoxetine 60 MG capsule Commonly known as: CYMBALTA Take 60 mg by mouth at bedtime.   feeding supplement (ENSURE ENLIVE) Liqd Take 237 mLs by mouth 3 (three) times daily between meals.   furosemide 40 MG tablet Commonly known as: LASIX Take 1 tablet (40 mg total) by mouth daily.   hydrochlorothiazide 25 MG tablet Commonly known as: HYDRODIURIL Take 25 mg by mouth daily.   hydroxychloroquine 200 MG tablet Commonly known as: PLAQUENIL Take 200 mg by mouth 2 (two) times daily.   lisinopril 40 MG tablet Commonly known as: ZESTRIL Take 40 mg by mouth daily.   megestrol 400 MG/10ML suspension Commonly known as: MEGACE Take 10 mLs (400 mg total) by mouth 2 (two) times daily.   metoprolol tartrate 100 MG tablet Commonly known  as: LOPRESSOR Take 100 mg by mouth daily.   multivitamin with minerals Tabs tablet Take 1 tablet by mouth daily.   omeprazole 20 MG capsule Commonly known as: PRILOSEC Take 20 mg by mouth as needed (acid reflux).   ondansetron 4 MG tablet Commonly known as: ZOFRAN Take 1 tablet (4 mg total) by mouth every 6 (six) hours as needed for nausea. What changed: reasons to take this   oxybutynin 10 MG 24 hr tablet Commonly known as: DITROPAN-XL Take 10 mg by mouth at bedtime.   pantoprazole 40 MG tablet Commonly known as: PROTONIX Take 1 tablet (40 mg total) by mouth 2 (two) times daily.   prazosin 1 MG capsule Commonly known as: MINIPRESS Take 1 mg by mouth at bedtime.   tiZANidine 4 MG tablet Commonly known as: ZANAFLEX Take 4 mg by mouth 2 (two) times daily.        Disposition and follow-up:   Ms.Jami HUMA IMHOFF was discharged from [facility] in Stable condition.  At the hospital follow up visit please address:  1.  Colitis: Presented with GI symptoms that improved with IV flagyl, gancyclovir, and IVF. GI panel and CMV negative. EGD and colonoscopy w/erosive gastropathy and evidence of recent bleed and ulcerated mucosa in distal colon for which biopsies taken. Patient with improvement in symptoms and stable for discharge. Please f/u biopsy results and pt to follow up with GI.  AKI on CKD 3:  Baseline Cr 1.9; Elevated to 2.74 on admission. Improved with IVF to 1.24 on discharge. Please f/u BMP   Anemia of inflammatory disease: Hb 9-10, iron panel consistent with anemia of chronic disease. Please continue to monitor. F/u CBC   UTI: UA with pyuria and urine culture with >100,000 E.coli. Patient received one dose of ceftriaxone; asymptomatic. Continue to monitor   Malnutrition/deconditioning: Home health PT/OT/RN ordered.   2.  Labs / imaging needed at time of follow-up: CBC, BMP  3.  Pending labs/ test needing follow-up: Biopsy results   Follow-up  Appointments: Follow-up Information     [facility] Follow up.   Specialty: Home Health Services Contact information: [address] [place] [address] [phone]         Kathi Der, MD. Schedule an appointment as soon as possible for a visit in 6 week(s).   Specialty: Gastroenterology Why: Follow-up for colitis  Contact information: [address] [place] [address] [phone]            Hospital Course by problem list: 1. 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 Pt was made NPO and given IV fluids, showed great improvement of symptoms. Was given prophylactic IV metronidazole and ganciclovir. GI panel was negative, CMV PCR result was negative GI performed EGD and colonoscopy, findings described below. Etiologies suspected to be ischemic vs infectious vs UC; however, this does appear to be chronic in nature. Colon biopsy pending. Patient able to tolerate diet on discharge.   2. AKI on CKD 3 Baseline Cr was 1.91, Creatine on admission was 2.74. Pt improved on IV fluids. Creatinine on discharge was 1.24  3. Normocytic anemia Hgb on admission was 9.8, which is the same as her Hgb was 3 month ago. It dropped to 8.2 over the course of 3 days, but improved with NPO diet and IV fluids. Hgb at discharge was 9.5  4. Hypertension Blood pressure on admission was 74/56, IV fluids were given and all blood pressure medications were held through 12/8, as blood pressures became elevated to the 150-170's systolic to 90-100's diastolic. Blood pressure prior to discharge was 151/97.  5. Malnutrition of moderate degree and severe deconditioning Pt had a lack of po intake for 4 days due to abdominal pain and difficulty completing bowel prep. Was seen by dietician, improved on soft diet. At  discharge, abdominal pain resolved and pt was eating normal diet. Home health PT/OT recommended and ordered on discharge.   6. Urinary tract infection UA with pyuria and bacteruria and urine culture with >100,000 colonies of E.coli. Patient received one dose of ceftriaxone. She denies any urinary symptoms and remains afebrile during admission.    Discharge Vitals:   BP (!) 151/97 (BP Location: Right Arm)   Pulse 95   Temp 98.7 F (37.1 C) (Oral)   Resp 18   Ht 5\' 7"  (1.702 m)   Wt 80.8 kg   SpO2 100%   BMI 27.90 kg/m   Pertinent Labs, Studies, and Procedures:  CBC Latest Ref Rng & Units 10/06/2019 10/05/2019 10/04/2019  WBC 4.0 - 10.5 K/uL 7.9 7.8 8.3  Hemoglobin 12.0 - 15.0 g/dL 14/05/2019) 10.0(L) 10.2(L)  Hematocrit 36.0 - 46.0 % 29.3(L) 30.5(L) 31.2(L)  Platelets 150 - 400 K/uL 291 293 309   BMP  Latest Ref Rng & Units 10/06/2019 10/05/2019 10/04/2019  Glucose 70 - 99 mg/dL 383(F) 79 76  BUN 8 - 23 mg/dL 8 9 9   Creatinine 0.44 - 1.00 mg/dL 3.83(A) 9.19(T) 6.60(A)  Sodium 135 - 145 mmol/L 139 141 143  Potassium 3.5 - 5.1 mmol/L 3.7 4.0 3.6  Chloride 98 - 111 mmol/L 109 113(H) 111  CO2 22 - 32 mmol/L 20(L) 18(L) 19(L)  Calcium 8.9 - 10.3 mg/dL 9.0 9.0 9.5   Iron/TIBC/Ferritin/ %Sat    Component Value Date/Time   IRON 37 10/01/2019 0607   TIBC 231 (L) 10/01/2019 0607   FERRITIN 365 (H) 10/01/2019 0607   IRONPCTSAT 16 10/01/2019 0607   Urinalysis    Component Value Date/Time   COLORURINE YELLOW 09/30/2019 1844   APPEARANCEUR CLOUDY (A) 09/30/2019 1844   LABSPEC 1.013 09/30/2019 1844   PHURINE 6.0 09/30/2019 1844   GLUCOSEU NEGATIVE 09/30/2019 1844   HGBUR NEGATIVE 09/30/2019 1844   BILIRUBINUR NEGATIVE 09/30/2019 1844   KETONESUR NEGATIVE 09/30/2019 1844   PROTEINUR 30 (A) 09/30/2019 1844   UROBILINOGEN 1.0 06/24/2010 1850   NITRITE NEGATIVE 09/30/2019 1844   LEUKOCYTESUR LARGE (A) 09/30/2019 1844    Urine culture - >100,000 E.coli colonies  CMV DNA - negative  CT  ABDOMEN PELVIS WO CONTRAST 09/30/2019:  IMPRESSION: 1. There is wall thickening associated with the distal transverse colon with adjacent fat stranding and no pneumatosis or evidence of perforation. The findings are consistent with colitis which could be infectious, inflammatory, or vascular. While the patient does have diverticuli, the findings on today's study are not thought to be due to diverticulitis due to the lack of diverticuli in the region of inflammation. 2. The gallbladder wall is prominent/somewhat thickened. This could be due to lack of distention. Recommend clinical correlation. 3. Mild atherosclerotic changes in the nonaneurysmal aorta.  US RENAL 09/30/2019:  IMPRESSION: 1. The right kidney measured 7.8 cm today which would be small. However, the right kidney measured 10.6 cm on the Mar 24, 2019 ultrasound and appeared to be normal in size on today's CT scan. Today's measurement of the right kidney is artifactual. 2. No acute abnormalities.  ENDOSCOPY 10/05/2019: IMPRESSION:  - Non-obstructing Schatzki ring. - Small hiatal hernia. - Erosive gastropathy with stigmata of recent bleeding. Biopsied. - Normal duodenal bulb, first portion of the duodenum and second portion of the duodenum.  COLONOSCOPY 10/05/2019:  IMPRESSION: - Preparation of the colon was poor. - Stool in the rectum, in the recto-sigmoid colon, in the sigmoid colon, in the descending colon and in the transverse colon. - Inflamed and ulcerated mucosa in the distal transverse colon. Biopsied. - Normal mucosa in the rectum, in the recto-sigmoid colon, in the sigmoid colon and in the descending colon. - Internal hemorrhoids.  Discharge Instructions: Discharge Instructions     Call MD for:  difficulty breathing, headache or visual disturbances   Complete by: As directed    Call MD for:  extreme fatigue   Complete by: As directed    Call MD for:  hives   Complete by: As directed    Call MD for:   persistant dizziness or light-headedness   Complete by: As directed    Call MD for:  persistant nausea and vomiting   Complete by: As directed    Call MD for:  redness, tenderness, or signs of infection (pain, swelling, redness, odor or green/yellow discharge around incision site)   Complete by: As directed    Call MD for:  severe uncontrolled  pain   Complete by: As directed    Call MD for:  temperature >100.4   Complete by: As directed    Diet - low sodium heart healthy   Complete by: As directed    Discharge instructions   Complete by: As directed    Ms. Markham JordanElliot,   You were admitted with abdominal pain, nausea, bloody bowel movements and decreased oral intake for several days and CT findings were suspicious of colitis. You received antibiotics and antivirals to cover for CMV colitis. You had an endoscopy and colonoscopy that did not show any evidence of CMV colitis. Biopsies were taken during these and the results will be sent to you. You had improvement of your symptoms during the hospitalization. At this time, please continue to take all your home medications as prescribed including aspirin and plavix. Please follow up with Dr. Georgiann CockerBrahmbatt at your earliest convenience.   Take care!   Increase activity slowly   Complete by: As directed        Signed: Eliezer BottomAslam, Tynika Luddy, MD  Internal Medicine, PGY-1 10/06/2019, 4:52 PM   Pager: [phone]

## 2019-10-04 ENCOUNTER — Encounter (HOSPITAL_COMMUNITY)
Admission: EM | Disposition: A | Discharge status: Home Health | Payer: Self-pay | Source: Home / Self Care | Attending: Student in an Organized Health Care Education/Training Program

## 2019-10-04 ENCOUNTER — Encounter (HOSPITAL_COMMUNITY): Payer: Self-pay | Admitting: Emergency Medicine

## 2019-10-04 LAB — CBC
HCT: 31.2 % — ABNORMAL LOW (ref 36.0–46.0)
Hemoglobin: 10.2 g/dL — ABNORMAL LOW (ref 12.0–15.0)
MCH: 29.7 pg (ref 26.0–34.0)
MCHC: 32.7 g/dL (ref 30.0–36.0)
MCV: 90.7 fL (ref 80.0–100.0)
Platelets: 309 10*3/uL (ref 150–400)
RBC: 3.44 MIL/uL — ABNORMAL LOW (ref 3.87–5.11)
RDW: 12.8 % (ref 11.5–15.5)
WBC: 8.3 10*3/uL (ref 4.0–10.5)
nRBC: 0 % (ref 0.0–0.2)

## 2019-10-04 LAB — BASIC METABOLIC PANEL
Anion gap: 13 (ref 5–15)
BUN: 9 mg/dL (ref 8–23)
CO2: 19 mmol/L — ABNORMAL LOW (ref 22–32)
Calcium: 9.5 mg/dL (ref 8.9–10.3)
Chloride: 111 mmol/L (ref 98–111)
Creatinine, Ser: 1.32 mg/dL — ABNORMAL HIGH (ref 0.44–1.00)
GFR calc Af Amer: 49 mL/min — ABNORMAL LOW (ref >60)
GFR calc non Af Amer: 42 mL/min — ABNORMAL LOW (ref >60)
Glucose, Bld: 76 mg/dL (ref 70–99)
Potassium: 3.6 mmol/L (ref 3.5–5.1)
Sodium: 143 mmol/L (ref 135–145)

## 2019-10-04 LAB — CMV DNA, QUANTITATIVE, PCR
CMV DNA Quant: NEGATIVE IU/mL
Log10 CMV Qn DNA Pl: UNDETERMINED log10 IU/mL

## 2019-10-04 SURGERY — CANCELLED PROCEDURE
Anesthesia: Monitor Anesthesia Care

## 2019-10-04 MED ORDER — SODIUM CHLORIDE 0.9 % IV SOLN
INTRAVENOUS | Status: AC
  Administered 2019-10-05: 06:00:00 via INTRAVENOUS

## 2019-10-04 MED ORDER — SODIUM CHLORIDE 0.9 % IV SOLN: INTRAVENOUS | Status: DC

## 2019-10-04 MED ORDER — AMLODIPINE BESYLATE 10 MG PO TABS
10.0000 mg | ORAL_TABLET | Freq: Every day | ORAL | Status: DC
  Administered 2019-10-04 – 2019-10-06 (×3): 10 mg via ORAL
  Filled 2019-10-04 (×3): qty 1

## 2019-10-04 MED ORDER — ALUM & MAG HYDROXIDE-SIMETH 200-200-20 MG/5ML PO SUSP
30.0000 mL | Freq: Once | ORAL | Status: AC
  Administered 2019-10-05: 30 mL via ORAL
  Filled 2019-10-04: qty 30

## 2019-10-04 MED ORDER — SODIUM CHLORIDE 0.9 % IV BOLUS
500.0000 mL | Freq: Once | INTRAVENOUS | Status: AC
  Administered 2019-10-05: 500 mL via INTRAVENOUS

## 2019-10-04 MED ORDER — PEG 3350-KCL-NA BICARB-NACL 420 G PO SOLR
4000.0000 mL | Freq: Once | ORAL | Status: AC
  Administered 2019-10-04: 4000 mL via ORAL
  Filled 2019-10-04: qty 4000

## 2019-10-04 MED ORDER — LIDOCAINE VISCOUS HCL 2 % MT SOLN
15.0000 mL | Freq: Once | OROMUCOSAL | Status: AC
  Administered 2019-10-05: 15 mL via ORAL
  Filled 2019-10-04: qty 15

## 2019-10-04 MED ORDER — SODIUM CHLORIDE 0.9 % IV SOLN
2.5000 mg/kg | INTRAVENOUS | Status: DC
  Administered 2019-10-05: 200 mg via INTRAVENOUS
  Filled 2019-10-04: qty 4

## 2019-10-04 SURGICAL SUPPLY — 25 items

## 2019-10-04 NOTE — Progress Notes (Signed)
PT Cancellation Note  Patient Details Name: Michelle Graves MRN: [id number] DOB: 01/01/1951   Cancelled Treatment:    Reason Eval/Treat Not Completed: Other (comment)(Refused due to weakness/diarhea after colon prep)   [name] 10/04/2019, 11:06 AM [name],PT Acute Rehabilitation Services Pager:  [phone]  Office:  [phone]

## 2019-10-04 NOTE — Progress Notes (Signed)
Reschedule EGD/Colon for tomorrow, 10/05/19, per Dr Alessandra Bevels. Patient did not finish prep and was encouraged to do so overnight.

## 2019-10-04 NOTE — Progress Notes (Signed)
1945; Rechecked pt; pt vomited again. C/o of cramping abd pain 5/10. Advised prn pain meds not available yet. And will give PRN zofran to help with her nausea. 2030; Pt said that her stomach feeling better and able to sip some more of the bowel prep. Continued to encourage drinking bowel prep. 2345; pt c/o of abd pain again.  Pt only able to drink 1/2 of her bowel prep at this time. Explained to pt that bowel prep is necessasry for the procedure in the AM. PRN percocet given. 0100; Pt could not drink anymore of her bowel prep. Pt had 2 large BM so far with some formed stool. This RN continued to encourage pt and let on call MD aware of situation.

## 2019-10-04 NOTE — Progress Notes (Signed)
Patient needs encouragement with drinking her Golytely, still trying to finish her 1 cup, only had 1 BM which is not clear.

## 2019-10-04 NOTE — Progress Notes (Signed)
[facility] Gastroenterology Progress Note  Michelle Graves 68 y.o. 26-Apr-1951  CC: Abdominal pain, colitis   Subjective: Patient did not completed her bowel prep and currently having brown-colored stool.  Had some abdominal pain overnight which is getting better.  Denies nausea or vomiting this morning.   ROS : Afebrile.  Negative for acute chest pain   Objective: Vital signs in last 24 hours: Vitals:   10/04/19 0525 10/04/19 0835  BP: (!) 157/95 (!) 186/100  Pulse: (!) 105 100  Resp:  (!) 21  Temp:  99 F (37.2 C)  SpO2:  98%    Physical Exam:  General:  Alert, cooperative, no distress, appears stated age  Head:  Normocephalic, without obvious abnormality, atraumatic  Eyes:  , EOM's intact,   Lungs:   Clear to auscultation bilaterally, respirations unlabored  Heart:  Regular rate and rhythm, S1, S2 normal  Abdomen:   Soft, generalized discomfort without any point tenderness, bowel sounds present, no peritoneal signs  Extremities: Extremities normal, atraumatic, no  edema       Lab Results: Recent Labs    10/03/19 0215 10/04/19 0258  NA 140 143  K 3.1* 3.6  CL 110 111  CO2 19* 19*  GLUCOSE 104* 76  BUN 12 9  CREATININE 1.41* 1.32*  CALCIUM 8.8* 9.5   No results for input(s): AST, ALT, ALKPHOS, BILITOT, PROT, ALBUMIN in the last 72 hours. Recent Labs    10/03/19 0215 10/04/19 0258  WBC 6.6 8.3  HGB 9.1* 10.2*  HCT 28.2* 31.2*  MCV 93.1 90.7  PLT 275 309   No results for input(s): LABPROT, INR in the last 72 hours.    Assessment/Plan: -Abnormal CT scan concerning for colitis.  Colonoscopy in June 2020 showed severe ischemic colitis. -History of gastric ulcer -Weight loss -Abdominal pain.  Could be from colitis  Recommendations -------------------------- -Reschedule EGD and colonoscopy for tomorrow.  Patient was encouraged to finish bowel prep. -Clear liquid diet today.  N.p.o. past midnight. -GI will follow     Otis Brace MD,  [place] 10/04/2019, 8:40 AM  Contact #  [phone]

## 2019-10-04 NOTE — TOC Initial Note (Addendum)
Transition of Care [facility]) - Initial/Assessment Note    Patient Details  Name: Michelle Graves MRN: [id number] Date of Birth: [birth year]  Transition [facility] CM/SW Contact:    Kingsley Plan, RN Phone Number: 10/04/2019, 11:07 AM  Clinical Narrative:                 Confirmed face sheet information with  patient at bedside. Patient from home alone, however her daughter lives "around the corner". Patient has 3 in 1 at home already. Agreeable to home health. Provided Medicare.gov home health list. Patient has no preference refferral given to [facility] with [facility] and accepted.  Called DR Aslam for orders and face to face.  Expected Discharge Plan: Home w Home Health Services Barriers to Discharge: Continued Medical Work up   Patient Goals and CMS Choice Patient states their goals for this hospitalization and ongoing recovery are:: to go home CMS Medicare.gov Compare Post Acute Care list provided to:: Patient Choice offered to / list presented to : Patient  Expected Discharge Plan and Services Expected Discharge Plan: Home w Home Health Services   Discharge Planning Services: CM Consult Post Acute Care Choice: Home Health Living arrangements for the past 2 months: Apartment                 DME Arranged: N/A         HH Arranged: PT HH Agency: [facility] Date [facility] Agency Contacted: 10/04/19 Time HH Agency Contacted: 1107 Representative spoke with at [facility] Agency: cory  Prior Living Arrangements/Services Living arrangements for the past 2 months: Apartment Lives with:: Self Patient language and need for interpreter reviewed:: Yes Do you feel safe going back to the place where you live?: Yes      Need for Family Participation in Patient Care: Yes (Comment) Care giver support system in place?: Yes (comment) Current home services: DME Criminal Activity/Legal Involvement Pertinent to Current Situation/Hospitalization: No - Comment as needed  Activities of Daily  Living Home Assistive Devices/Equipment: (4 prongs per pt) ADL Screening (condition at time of admission) Patient's cognitive ability adequate to safely complete daily activities?: Yes Is the patient deaf or have difficulty hearing?: No Does the patient have difficulty seeing, even when wearing glasses/contacts?: No Does the patient have difficulty concentrating, remembering, or making decisions?: No Patient able to express need for assistance with ADLs?: Yes Does the patient have difficulty dressing or bathing?: No Independently performs ADLs?: Yes (appropriate for developmental age) Does the patient have difficulty walking or climbing stairs?: Yes Weakness of Legs: Both Weakness of Arms/Hands: None  Permission Sought/Granted   Permission granted to share information with : Yes, Verbal Permission Granted     Permission granted to share info w AGENCY: [facility]        Emotional Assessment Appearance:: Appears stated age Attitude/Demeanor/Rapport: Engaged Affect (typically observed): Accepting Orientation: : Oriented to  Time, Oriented to Situation, Oriented to Place, Oriented to Self Alcohol / Substance Use: Not Applicable Psych Involvement: No (comment)  Admission diagnosis:  Blood in stool [K92.1] AKI (acute kidney injury) (HCC) [N17.9] Patient Active Problem List   Diagnosis Date Noted  . Malnutrition of moderate degree 10/03/2019  . Colitis 09/30/2019  . AKI (acute kidney injury) (HCC) 05/28/2019  . Nausea & vomiting 05/28/2019  . 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 Anemia, chronic disease 05/28/2019  . Swelling   . Small vessel disease, cerebrovascular   . Acute ischemic stroke (HCC)   . General weakness   .  Acute left ankle pain   . Acute on chronic renal failure ([place]) 05/13/2019  . Weakness of both lower extremities 05/13/2019  . Cerebral thrombosis with cerebral infarction 03/31/2019  . New onset a-fib ([place]) 03/27/2019  . Acute kidney injury superimposed on CKD ([place])  03/24/2019  . Hypotension 03/24/2019  . Dark emesis 03/24/2019  . Dark stools 03/24/2019  . Diverticulitis of intestine without perforation or abscess without bleeding   . Sinus tachycardia 08/27/2016  . Diverticulitis 08/26/2016  . HYPERTENSION, MALIGNANT ESSENTIAL 06/24/2007  . KIDNEY DISEASE, CHRONIC, STAGE III 06/24/2007  . ARTHRITIS, RHEUMATOID, SEROPOSITIVE 06/24/2007   PCP:  [facility]:   [facility] [place], [address] [place] [address] Phone: [phone] Fax: [phone]     Social Determinants of Health (SDOH) Interventions    Readmission Risk Interventions Readmission Risk Prevention Plan 05/31/2019  Transportation Screening Complete  PCP or Specialist Appt within 3-5 Days Not Complete  Not Complete comments plan for SNF  HRI or [place] Not Complete  HRI or Home Care Consult comments plan for SNF  Social Work Consult for [place] Planning/Counseling Complete  Palliative Care Screening Not Applicable  Medication Review (RN Care Manager) Complete  Some recent data might be hidden

## 2019-10-04 NOTE — Anesthesia Preprocedure Evaluation (Addendum)
Anesthesia Evaluation  Patient identified by MRN, date of birth, ID band Patient awake    Reviewed: Allergy & Precautions, NPO status , Patient's Chart, lab work & pertinent test results  History of Anesthesia Complications Negative for: history of anesthetic complications  Airway Mallampati: II  TM Distance: >3 FB Neck ROM: Full    Dental  (+) Edentulous Upper, Edentulous Lower   Pulmonary neg pulmonary ROS,    Pulmonary exam normal        Cardiovascular hypertension, Pt. on medications and Pt. on home beta blockers Normal cardiovascular exam+ dysrhythmias Atrial Fibrillation + Valvular Problems/Murmurs MR    '20 TTE - EF 55-60%. Mild concentric left ventricular hypertrophy. Left ventricular diastolic Doppler parameters are consistent with impaired relaxation. Mild-moderate MR. Trivial AI. There is mild dilatation of the ascending aorta measuring 39 mm    Neuro/Psych CVA, No Residual Symptoms negative psych ROS   GI/Hepatic PUD, (+)     substance abuse  alcohol use,   Endo/Other  negative endocrine ROS  Renal/GU Renal InsufficiencyRenal disease     Musculoskeletal  (+) Arthritis ,   Abdominal   Peds  Hematology  (+) anemia ,   Anesthesia Other Findings Covid negative 12/4   Reproductive/Obstetrics                            Anesthesia Physical  Anesthesia Plan  ASA: III  Anesthesia Plan: MAC   Post-op Pain Management:    Induction: Intravenous  PONV Risk Score and Plan: 2 and Propofol infusion and Treatment may vary due to age or medical condition  Airway Management Planned: Nasal Cannula and Natural Airway  Additional Equipment: None  Intra-op Plan:   Post-operative Plan:   Informed Consent: I have reviewed the patients History and Physical, chart, labs and discussed the procedure including the risks, benefits and alternatives for the proposed anesthesia with the  patient or authorized representative who has indicated his/her understanding and acceptance.   Patient has DNR.  Discussed DNR with patient and Suspend DNR.     Plan Discussed with: CRNA and Anesthesiologist  Anesthesia Plan Comments:        Anesthesia Quick Evaluation

## 2019-10-04 NOTE — Progress Notes (Signed)
Notified Dr. Charleen Kirks of pt's BP 168/117, pulse 118. Also notified her that pt had vomited right after I gave her nighttime meds and a Percocet. Pt was medicated with Zofran. Informed Dr. Suezanne Jacquet that pt only completed a little more than 1/2 of prep for the colonoscopy. New orders received.

## 2019-10-04 NOTE — Progress Notes (Signed)
   Subjective:  Pt had two episodes of vomiting and abdominal pain overnight while attempting to drink bowel prep for EGD/colonoscopy today. Pt was only able to drink half of bowel prep. Pt reports she is still feeling stomach pain this morning. She doesn't feel well but does want to try to drink the bowel prep again today.  Objective:  Vital signs in last 24 hours: Vitals:   10/03/19 1946 10/04/19 0435 10/04/19 0437 10/04/19 0525  BP: (!) 164/97 (!) 188/114  (!) 157/95  Pulse: 100 (!) 119 (!) 107 (!) 105  Resp: 16 15    Temp: 98.8 F (37.1 C)  99 F (37.2 C)   TempSrc:   Oral   SpO2: 99% 100% 99%   Weight:      Height:       Weight change:   Intake/Output Summary (Last 24 hours) at 10/04/2019 6387 Last data filed at 10/04/2019 0600 Gross per 24 hour  Intake 1844.25 ml  Output 950 ml  Net 894.25 ml   Physical Exam Constitutional:      Comments: Pt looks uncomfortable and tired while laying on hospital bed  Abdominal:     General: Abdomen is flat. Bowel sounds are normal.     Palpations: Abdomen is soft.     Tenderness: There is abdominal tenderness in the periumbilical area.  Neurological:     General: No focal deficit present.     Mental Status: She is alert.     Assessment/Plan:  Principal Problem:   Colitis Active Problems:   HYPERTENSION, MALIGNANT ESSENTIAL   AKI (acute kidney injury) ([place])   Anemia, chronic disease   Malnutrition of moderate degree Ms. Tiffany Kocher is a 68 year old female with a medical history of rheumatoid arthritis, history of CVA, CKD 3 and CMV colitis during immunosuppressant treatment who presents with abdominal pain and hematochezia, and associated nausea/vomiting, with imaging that revealed wall thickening and fat stranding concerning for colitis of unknown etiology, though most likely ischemic based off of prior colonscopy. AKI is improving. Hypokalemia and hypocalcemia have resolved  Colitis: Pt's history of present illness suggest the  etiology is most likely ischemic, though infectious could be possible based on previous CMV colitis. Planned EGD/colonoscopy for today had to be rescheduled for tomorrow, as pt was unable to complete bowel prep.  - GI panel and CMV pending; -Conitnue IV metronidazole 500 mg and prophylacticIVgancyclovir 90 mg - GI on board; greatly appreciate recs; planning for EGD and colonoscopy tomorrow (12/9) - diet is clear liquids until mifnight, then NPO diet until EGD and colonoscopy tomorrow (12/9)  Anemia: Hcb 8.2->9.1->10.2. Improving. Most likely an anemia of inflammation - Continue to monitor  AKI on CKD 3: GFR 38->45->49, Cr 1.61->1.41->1.32. AKI is Improving. Mild hypokalemia and hypocalcemia have resolved.  - Continue to monitor with I/O's  HTN: Pt's pressures are elevated in the setting of acute abdominal pain. Hypokalemia has resolved. - Plan to restart metoprolol tartrate, amlodipine, and lasix following EGD/colonoscopy tomorrow (12/9)  Diet:Clear liquids until midnight, then NPO through tomorrow (12/9) until after GI procedure DVT FIE:PPIR, Nopharmacologic DVT prophylaxis due to hematochezia. Will continue to hold Megace due to its prothrombotic risk. Dispo: Anticipated d/cpending clinical improvement    LOS: 4 days   Carin Primrose, Medical Student 10/04/2019, 6:14 AM

## 2019-10-04 NOTE — Progress Notes (Addendum)
Pt's BP 160/116. Pulse 114. Pt says she is having some abdominal pain right now,but refuses pain med. Paged MD.

## 2019-10-04 NOTE — Anesthesia Preprocedure Evaluation (Deleted)
Anesthesia Evaluation  Patient identified by MRN, date of birth, ID band Patient awake    Reviewed: Allergy & Precautions, NPO status , Patient's Chart, lab work & pertinent test results  History of Anesthesia Complications Negative for: history of anesthetic complications  Airway Mallampati: II  TM Distance: >3 FB Neck ROM: Full    Dental  (+) Edentulous Upper, Edentulous Lower   Pulmonary neg pulmonary ROS,    Pulmonary exam normal        Cardiovascular hypertension, Pt. on medications and Pt. on home beta blockers + dysrhythmias Atrial Fibrillation + Valvular Problems/Murmurs MR  Rhythm:Regular Rate:Tachycardia   '20 TTE - EF 55-60%. Mild concentric left ventricular hypertrophy. Left ventricular diastolic Doppler parameters are consistent with impaired relaxation. Mild-moderate MR. Trivial AI. There is mild dilatation of the ascending aorta measuring 39 mm    Neuro/Psych CVA, No Residual Symptoms negative psych ROS   GI/Hepatic PUD, (+)     substance abuse  alcohol use,   Endo/Other  negative endocrine ROS  Renal/GU Renal InsufficiencyRenal disease     Musculoskeletal  (+) Arthritis ,   Abdominal   Peds  Hematology  (+) anemia ,   Anesthesia Other Findings Covid negative 12/4   Reproductive/Obstetrics                           Anesthesia Physical Anesthesia Plan  ASA: III  Anesthesia Plan: MAC   Post-op Pain Management:    Induction: Intravenous  PONV Risk Score and Plan: 2 and Propofol infusion and Treatment may vary due to age or medical condition  Airway Management Planned: Nasal Cannula and Natural Airway  Additional Equipment: None  Intra-op Plan:   Post-operative Plan:   Informed Consent: I have reviewed the patients History and Physical, chart, labs and discussed the procedure including the risks, benefits and alternatives for the proposed anesthesia with the  patient or authorized representative who has indicated his/her understanding and acceptance.   Patient has DNR.  Discussed DNR with patient and Suspend DNR.     Plan Discussed with: CRNA and Anesthesiologist  Anesthesia Plan Comments:        Anesthesia Quick Evaluation

## 2019-10-05 ENCOUNTER — Inpatient Hospital Stay (HOSPITAL_COMMUNITY): Payer: Medicare Other | Admitting: Anesthesiology

## 2019-10-05 ENCOUNTER — Encounter (HOSPITAL_COMMUNITY): Payer: Self-pay | Admitting: *Deleted

## 2019-10-05 ENCOUNTER — Encounter (HOSPITAL_COMMUNITY)
Admission: EM | Disposition: A | Discharge status: Home Health | Payer: Self-pay | Source: Home / Self Care | Attending: Student in an Organized Health Care Education/Training Program

## 2019-10-05 DIAGNOSIS — E440 Moderate protein-calorie malnutrition: Secondary | ICD-10-CM

## 2019-10-05 HISTORY — PX: BIOPSY: SHX5522

## 2019-10-05 HISTORY — PX: ESOPHAGOGASTRODUODENOSCOPY (EGD) WITH PROPOFOL: SHX5813

## 2019-10-05 HISTORY — PX: COLONOSCOPY WITH PROPOFOL: SHX5780

## 2019-10-05 LAB — CBC
HCT: 30.5 % — ABNORMAL LOW (ref 36.0–46.0)
Hemoglobin: 10 g/dL — ABNORMAL LOW (ref 12.0–15.0)
MCH: 29.7 pg (ref 26.0–34.0)
MCHC: 32.8 g/dL (ref 30.0–36.0)
MCV: 90.5 fL (ref 80.0–100.0)
Platelets: 293 10*3/uL (ref 150–400)
RBC: 3.37 MIL/uL — ABNORMAL LOW (ref 3.87–5.11)
RDW: 13.2 % (ref 11.5–15.5)
WBC: 7.8 10*3/uL (ref 4.0–10.5)
nRBC: 0 % (ref 0.0–0.2)

## 2019-10-05 LAB — BASIC METABOLIC PANEL
Anion gap: 10 (ref 5–15)
BUN: 9 mg/dL (ref 8–23)
CO2: 18 mmol/L — ABNORMAL LOW (ref 22–32)
Calcium: 9 mg/dL (ref 8.9–10.3)
Chloride: 113 mmol/L — ABNORMAL HIGH (ref 98–111)
Creatinine, Ser: 1.43 mg/dL — ABNORMAL HIGH (ref 0.44–1.00)
GFR calc Af Amer: 44 mL/min — ABNORMAL LOW (ref >60)
GFR calc non Af Amer: 38 mL/min — ABNORMAL LOW (ref >60)
Glucose, Bld: 79 mg/dL (ref 70–99)
Potassium: 4 mmol/L (ref 3.5–5.1)
Sodium: 141 mmol/L (ref 135–145)

## 2019-10-05 LAB — MAGNESIUM: Magnesium: 1.4 mg/dL — ABNORMAL LOW (ref 1.7–2.4)

## 2019-10-05 SURGERY — ESOPHAGOGASTRODUODENOSCOPY (EGD) WITH PROPOFOL
Anesthesia: Monitor Anesthesia Care

## 2019-10-05 MED ORDER — CLOPIDOGREL BISULFATE 75 MG PO TABS
75.0000 mg | ORAL_TABLET | Freq: Every day | ORAL | Status: DC
  Administered 2019-10-06: 75 mg via ORAL
  Filled 2019-10-05: qty 1

## 2019-10-05 MED ORDER — ENSURE ENLIVE PO LIQD
237.0000 mL | Freq: Three times a day (TID) | ORAL | Status: DC
  Administered 2019-10-05 – 2019-10-06 (×3): 237 mL via ORAL

## 2019-10-05 MED ORDER — LABETALOL HCL 5 MG/ML IV SOLN
INTRAVENOUS | Status: DC | PRN
  Administered 2019-10-05 (×2): 5 mg via INTRAVENOUS

## 2019-10-05 MED ORDER — LACTATED RINGERS IV SOLN
INTRAVENOUS | Status: AC | PRN
  Administered 2019-10-05: 1000 mL via INTRAVENOUS

## 2019-10-05 MED ORDER — PROPOFOL 500 MG/50ML IV EMUL
INTRAVENOUS | Status: DC | PRN
  Administered 2019-10-05: 75 ug/kg/min via INTRAVENOUS

## 2019-10-05 MED ORDER — MAGNESIUM SULFATE 2 GM/50ML IV SOLN
2.0000 g | Freq: Once | INTRAVENOUS | Status: AC
  Administered 2019-10-05: 2 g via INTRAVENOUS
  Filled 2019-10-05: qty 50

## 2019-10-05 MED ORDER — PROPOFOL 10 MG/ML IV BOLUS
INTRAVENOUS | Status: DC | PRN
  Administered 2019-10-05: 20 mg via INTRAVENOUS
  Administered 2019-10-05: 30 mg via INTRAVENOUS
  Administered 2019-10-05: 20 mg via INTRAVENOUS

## 2019-10-05 MED ORDER — METOPROLOL TARTRATE 100 MG PO TABS
100.0000 mg | ORAL_TABLET | Freq: Every day | ORAL | Status: DC
  Administered 2019-10-05 – 2019-10-06 (×2): 100 mg via ORAL
  Filled 2019-10-05 (×2): qty 1

## 2019-10-05 MED ORDER — LIDOCAINE 2% (20 MG/ML) 5 ML SYRINGE
INTRAMUSCULAR | Status: DC | PRN
  Administered 2019-10-05: 60 mg via INTRAVENOUS

## 2019-10-05 SURGICAL SUPPLY — 25 items

## 2019-10-05 NOTE — Anesthesia Postprocedure Evaluation (Signed)
Anesthesia Post Note  Patient: Michelle Graves  Procedure(s) Performed: ESOPHAGOGASTRODUODENOSCOPY (EGD) WITH PROPOFOL (N/A ) COLONOSCOPY WITH PROPOFOL (N/A ) BIOPSY     Patient location during evaluation: PACU Anesthesia Type: MAC Level of consciousness: awake and alert Pain management: pain level controlled Vital Signs Assessment: post-procedure vital signs reviewed and stable Respiratory status: spontaneous breathing, nonlabored ventilation and respiratory function stable Cardiovascular status: stable and blood pressure returned to baseline Anesthetic complications: no    Last Vitals:  Vitals:   10/05/19 0952 10/05/19 1001  BP: (!) 177/110 (!) 175/98  Pulse: 94 92  Resp: (!) 26 18  Temp:    SpO2: 97% 97%    Last Pain:  Vitals:   10/05/19 1001  TempSrc:   PainSc: [name]

## 2019-10-05 NOTE — Progress Notes (Addendum)
[facility] Progress Note  Michelle Graves 68 y.o. 1951-05-09  CC: Abdominal pain, colitis   Subjective: Only drank half gallon of prep again.  Not having clear bowel movements.  Abdominal pain resolved.  Denies any blood in the stool.   ROS : Afebrile.  Negative for acute chest pain   Objective: Vital signs in last 24 hours: Vitals:   10/05/19 0447 10/05/19 0819  BP: (!) 154/102 (!) 165/111  Pulse: (!) 112   Resp: 20 (!) 22  Temp:  (!) 96.9 F (36.1 C)  SpO2: 99% 98%    Physical Exam:  General:  Alert, cooperative, no distress, appears stated age  Head:  Normocephalic, without obvious abnormality, atraumatic  Eyes:  , EOM's intact,   Lungs:   Clear to auscultation bilaterally, respirations unlabored  Heart:  Regular rate and rhythm, S1, S2 normal  Abdomen:   Soft, NT, ND, bowel sounds present, no peritoneal signs  Extremities: Extremities normal, atraumatic, no  edema       Lab Results: Recent Labs    10/04/19 0258 10/05/19 0259  NA 143 141  K 3.6 4.0  CL 111 113*  CO2 19* 18*  GLUCOSE 76 79  BUN 9 9  CREATININE 1.32* 1.43*  CALCIUM 9.5 9.0  MG  --  1.4*   No results for input(s): AST, ALT, ALKPHOS, BILITOT, PROT, ALBUMIN in the last 72 hours. Recent Labs    10/04/19 0258 10/05/19 0259  WBC 8.3 7.8  HGB 10.2* 10.0*  HCT 31.2* 30.5*  MCV 90.7 90.5  PLT 309 293   No results for input(s): LABPROT, INR in the last 72 hours.    Assessment/Plan: -Abnormal CT scan concerning for colitis.  Colonoscopy in June 2020 showed severe ischemic colitis. -History of gastric ulcer -Weight loss -Abdominal pain.  Could be from colitis  Recommendations -------------------------- -Proceed with EGD and colonoscopy today.  Possibility of poor prep because of lack of complete bowel prep discussed with the patient.  Risks (bleeding, infection, bowel perforation that could require surgery, sedation-related changes in cardiopulmonary systems), benefits  (identification and possible treatment of source of symptoms, exclusion of certain causes of symptoms), and alternatives (watchful waiting, radiographic imaging studies, empiric medical treatment)  were explained to patient in detail and patient wishes to proceed.     Otis Brace MD, [place] 10/05/2019, 8:33 AM  Contact #  [phone]

## 2019-10-05 NOTE — Progress Notes (Signed)
Physical Therapy Treatment Patient Details Name: Michelle Graves MRN: [id number] DOB: May 22, 1951 Today's Date: 10/05/2019    History of Present Illness Patient is a 68 year old female who presented to the hospital with reports of bloddy stool and fatigue. She was admitted on 09/30/2019 with Collitis. She has a history of falls. She has had 3 falls over the past few months. She reports her feet just slip out. She started having severe low back pain following one of her falls about a year ago. PMH: diverticulitis, CVA, arthritis , HTN, chest pain    PT Comments    Patient moving slowly after colonoscopy this morning, however did participate. She is eager to return home, where she lives alone with her daughter in/out as needed (groceries, errands, some assist with cleaning). She is walking household distances with RW and able to negotiate around tight spaces in her room with no cues or assist needed. Feel she can benefit from continued [facility] as she did not need an assistive device prior to recent hospitalizations.     Follow Up Recommendations  Home health PT     Equipment Recommendations  None recommended by PT    Recommendations for Other Services       Precautions / Restrictions Precautions Precautions: Fall Restrictions Weight Bearing Restrictions: No    Mobility  Bed Mobility Overal bed mobility: Modified Independent Bed Mobility: Supine to Sit;Sit to Supine     Supine to sit: Modified independent (Device/Increase time);HOB elevated Sit to supine: Modified independent (Device/Increase time)   General bed mobility comments: Patient moves slowly and with effort, but does not need assist  Transfers Overall transfer level: Needs assistance Equipment used: Rolling walker (2 wheeled) Transfers: Sit to/from Stand Sit to Stand: Modified independent (Device/Increase time)            Ambulation/Gait Ambulation/Gait assistance: Supervision Gait Distance (Feet): 25 Feet(seated  rest; 40 ft) Assistive device: Rolling walker (2 wheeled) Gait Pattern/deviations: Step-through pattern Gait velocity: decreased   General Gait Details: slow but steady gait pattern; able to maneuver around multiple obstacles in her room without errors   Stairs             Wheelchair Mobility    Modified Rankin (Stroke Patients Only)       Balance Overall balance assessment: Needs assistance;History of Falls Sitting-balance support: No upper extremity supported;Feet unsupported Sitting balance-[name]: Good     Standing balance support: No upper extremity supported Standing balance-[name]: Fair                              Cognition Arousal/Alertness: Awake/alert Behavior During Therapy: WFL for tasks assessed/performed Overall Cognitive Status: Within Functional Limits for tasks assessed                                        Exercises General Exercises - Lower Extremity Long Arc Quad: AROM;Both;10 reps;Seated    General Comments General comments (skin integrity, edema, etc.): Had colonoscopy this morning and feeling "worn out" requiring encouragement and incr time. She feels she is moving well enough to be home alone with daughter assisting with community needs. States she was working with [facility] prior to admission due to continued need for walker after previous hospitalization. Her goal is to regain ability to walk without a device.  Pertinent Vitals/Pain Pain Assessment: Faces Faces Pain Scale: Hurts little more Pain Location: lower back  Pain Descriptors / Indicators: Aching Pain Intervention(s): Limited activity within patient's tolerance;Monitored during session;Repositioned    Home Living                      Prior Function            PT Goals (current goals can now be found in the care plan section) Acute Rehab PT Goals Patient Stated Goal: to get stronger  Time For Goal Achievement:  10/08/19 Potential to Achieve Goals: Good Progress towards PT goals: Progressing toward goals    Frequency    Min 2X/week      PT Plan Current plan remains appropriate    Co-evaluation              AM-PAC PT "6 Clicks" Mobility   Outcome Measure  Help needed turning from your back to your side while in a flat bed without using bedrails?: None Help needed moving from lying on your back to sitting on the side of a flat bed without using bedrails?: None Help needed moving to and from a bed to a chair (including a wheelchair)?: A Little Help needed standing up from a chair using your arms (e.g., wheelchair or bedside chair)?: A Little Help needed to walk in hospital room?: A Little Help needed climbing 3-5 steps with a railing? : A Lot 6 Click Score: 19    End of Session   Activity Tolerance: Patient limited by fatigue Patient left: with call bell/phone within reach;in bed;with bed alarm set   PT Visit Diagnosis: Other abnormalities of gait and mobility (R26.89)     Time: 6387-5643 PT Time Calculation (min) (ACUTE ONLY): 23 min  Charges:  $Gait Training: 8-22 mins $Self Care/Home Management: 8-22                      Veda Canning, PT Pager [phone]    Zena Amos 10/05/2019, 1:37 PM

## 2019-10-05 NOTE — Progress Notes (Addendum)
Nutrition Follow-up  RD working remotely.  DOCUMENTATION CODES:   Non-severe (moderate) malnutrition in context of chronic illness  INTERVENTION:   -D/c Boost Breeze po TID, each supplement provides 250 kcal and 9 grams of protein -Continue MVI with minerals daily -Ensure Enlive po TID, each supplement provides 350 kcal and 20 grams of protein -Magic cup TID with meals, each supplement provides 290 kcal and 9 grams of protein  NUTRITION DIAGNOSIS:   Moderate Malnutrition related to chronic illness(ischemic colitis) as evidenced by percent weight loss, energy intake < or equal to 75% for > or equal to 1 month, mild fat depletion, moderate fat depletion, mild muscle depletion, moderate muscle depletion.  Ongoing  GOAL:   Patient will meet greater than or equal to 90% of their needs  Progressing   MONITOR:   PO intake, Supplement acceptance, Diet advancement, Labs, Weight trends, Skin, I & O's  REASON FOR ASSESSMENT:   Malnutrition Screening Tool    ASSESSMENT:   68 year old female with past medical history of RA, DDD, history of CVA, HTN, CKD3, diverticulitis and history of CMV colitis who presented to ED with 2 days of bloody bowel movements with nausea, vomiting, and abdominal pain. Patient endorses fatigue, recent falls in the past month, and 25 lb wt loss in the past 3 months. CT abdomen pelvis concerning for infectious colitis.  12/9- colonoscopy aborted due to poor bowel prep; EGD revealed non-obstructing Schatzki ring, small hiatal hernia, no stomach ulcerations, and gastric antrum erosions with signs of recent bleeding (biopsied)  Reviewed I/O's: +692 ml x 24 hours and +854 ml since admission  Colonoscopy aborted today due to poor oral prep tolerance (per RN note, pt only about consume about half of bowel prep).   Pt now on a soft diet. RD will adjust supplement regimen per pt request.   Labs reviewed.   Diet Order:   Diet Order            DIET SOFT Room  service appropriate? Yes; Fluid consistency: Thin  Diet effective now              EDUCATION NEEDS:   Education needs have been addressed  Skin:  Skin Assessment: Reviewed RN Assessment  Last BM:  10/04/19  Height:   Ht Readings from Last 1 Encounters:  10/05/19 5\' 7"  (1.702 m)    Weight:   Wt Readings from Last 1 Encounters:  10/05/19 80.8 kg    Ideal Body Weight:  60.5 kg  BMI:  Body mass index is 27.9 kg/m.  Estimated Nutritional Needs:   Kcal:  1800-2000  Protein:  90-105 grams  Fluid:  > 1.8 L    Jamerion Cabello A. Jimmye Norman, RD, LDN, [place] Registered Dietitian II Certified Diabetes Care and Education Specialist Pager: [phone] After hours Pager: [phone]

## 2019-10-05 NOTE — Progress Notes (Signed)
Subjective:  Last night, pt was continuing to experience abdominal pain, had one episode of vomiting Completed only half of bowel prep, not having clear BM. Pt went to GI procedure room for colonoscopy this morning.   Saw pt after procedure, pt had no concerns. Pt was experiencing some lingering abdominal pain similar to yesterday but it had greatly improved. Pt wants to start eating soft foods and reports wanting to do all she can to get her strength back.  Objective:  Vital signs in last 24 hours: Vitals:   10/04/19 2232 10/04/19 2233 10/05/19 0151 10/05/19 0447  BP: (!) 168/117  (!) 149/107 (!) 154/102  Pulse: (!) 118  (!) 129 (!) 112  Resp: 20  18 20   Temp:  99.5 F (37.5 C)    TempSrc:      SpO2: 100%  100% 99%  Weight:      Height:       Weight change:   Intake/Output Summary (Last 24 hours) at 10/05/2019 0708 Last data filed at 10/05/2019 0500 Gross per 24 hour  Intake 791.67 ml  Output 100 ml  Net 691.67 ml   Physical Exam Constitutional:      General: She is not in acute distress. Cardiovascular:     Rate and Rhythm: Normal rate and regular rhythm.     Heart sounds: Normal heart sounds. No murmur.     Comments: Pulses 2+ in bilateral lower extremities Abdominal:     General: Abdomen is flat. There is no distension.     Palpations: Abdomen is soft.     Tenderness: There is abdominal tenderness in the epigastric area and left upper quadrant.     Comments: Mild tenderness to palpation in epigastrum and LUQ  Skin:    General: Skin is warm and dry.  Neurological:     Mental Status: She is alert.    Labs: Cl: 113, CO3 18, Cr 1.32->1.43, GFR 49->44 Hgb 10.2->10.0, Hct 31.2->30.5  EGD: Non-obstructing Schatzki ring, small hiatal hernia, erosive gastropathy with stigmata of recent bleeding, and normal duodenum  Colonoscopy: Preparation of colon was poor, inflamed and ulcerated mucosa in the distal transverse colon. Biopsied. Normal mucosa in the rectum,  recto-sigmoid colon, sigmoid colon, and descending colon. Small internal hemorrhoids  Assessment/Plan:  Principal Problem:   Colitis Active Problems:   AKI (acute kidney injury) ([place])   Anemia, chronic disease   Malnutrition of moderate degree  [name] is a 68 year old female with a medical history of rheumatoid arthritis, history of CVA, CKD 3 and CMV colitis during immunosuppressant treatment who presents with abdominal pain and hematochezia, and associated nausea/vomiting, with imaging that revealed wall thickening and fat stranding concerning for colitis of unknown etiology, with new colonoscopy showing findings low likelihood of infectious colitis. Pt is hemodynamically stable and clinically improving.  Colitis: EGD/colonsocopy seem to decrease likelihood of infectious colitis. Ischemic vs. Ulcerative colitis are highest probability on the differential. - Colon biopsy pending -D/CIV metronidazole 500 mg and IVgancyclovir 90 mg - Progress diet to soft solids  Anemia: Hcb 10.2->10.0. worsening in setting of dietary restrictions for colonoscopy. - Continue to monitor - Advance diet to soft solids  AKI on CKD 3: GFR 49->44, Cr 1.32->1.41.AKI isworsening in setting of dietary restrictions amd vomiting 2/2 to bowel prep for colonoscopy. Mild hyperchloremia and acidosis - Continue to monitor with I/O's - Advance diet to soft solids  Malnutrition of moderate degree: Abdominal pain is resolving, problem seemed exacerbated by bowel prep solution. - Continue to  monitor with I/O's - Advance diet to soft solids   LOS: 5 days   Frederico Hamman, Medical Student 10/05/2019, 7:08 AM

## 2019-10-05 NOTE — Anesthesia Procedure Notes (Signed)
Procedure Name: MAC Date/Time: 10/05/2019 8:55 AM Performed by: Candis Shine, CRNA Pre-anesthesia Checklist: Patient identified, Emergency Drugs available, Suction available, Patient being monitored and Timeout performed Patient Re-evaluated:Patient Re-evaluated prior to induction Oxygen Delivery Method: Nasal cannula Dental Injury: Teeth and Oropharynx as per pre-operative assessment

## 2019-10-05 NOTE — Op Note (Signed)
[facility] Patient Name: Michelle MinaBarbara Huseby Procedure Date : 10/05/2019 MRN: [id number] Attending MD: Kathi DerParag Courteny Egler , MD Date of Birth: 08/31/1951 CSN: [id number] Age: 68 Admit Type: Inpatient Procedure:                Upper GI endoscopy Indications:              Follow-up of gastric ulcer Providers:                Kathi DerParag Hoby Kawai, MD, Glory RosebushJulie Gibson, RN, Blenda MountsZharia                            Burton, RN, Arlee Muslimhris Chandler Tech., Technician, Koren Bounduy                            Nguyen, Technician, Arva ChafeBlaire Harder, CRNA Referring MD:              Medicines:                Sedation Administered by an Anesthesia Professional Complications:            No immediate complications. Estimated Blood Loss:     Estimated blood loss was minimal. Procedure:                Pre-Anesthesia Assessment:                           - Prior to the procedure, a History and Physical                            was performed, and patient medications and                            allergies were reviewed. The patient's tolerance of                            previous anesthesia was also reviewed. The risks                            and benefits of the procedure and the sedation                            options and risks were discussed with the patient.                            All questions were answered, and informed consent                            was obtained. Prior Anticoagulants: The patient has                            taken Plavix (clopidogrel), last dose was 5 days                            prior to procedure. ASA Grade Assessment: III - A  patient with severe systemic disease. After                            reviewing the risks and benefits, the patient was                            deemed in satisfactory condition to undergo the                            procedure.                           After obtaining informed consent, the endoscope was       passed under direct vision. Throughout the                            procedure, the patient's blood pressure, pulse, and                            oxygen saturations were monitored continuously. The                            GIF-H190 ([id number]) Olympus gastroscope was                            introduced through the mouth, and advanced to the                            second part of duodenum. The upper GI endoscopy was                            accomplished without difficulty. The patient                            tolerated the procedure well. Scope In: Scope Out: Findings:      A non-obstructing Schatzki ring was found at the gastroesophageal       junction at 36 cm.      A small hiatal hernia was present.      There is no endoscopic evidence of ulceration in the entire examined       stomach.      A few dispersed erosions with stigmata of recent bleeding were found in       the gastric antrum. Biopsies were taken with a cold forceps for       histology.      The cardia and gastric fundus were normal on retroflexion.      The duodenal bulb, first portion of the duodenum and second portion of       the duodenum were normal. Impression:               - Non-obstructing Schatzki ring.                           - Small hiatal hernia.                           -  Erosive gastropathy with stigmata of recent                            bleeding. Biopsied.                           - Normal duodenal bulb, first portion of the                            duodenum and second portion of the duodenum. Recommendation:           - Perform a colonoscopy today. Procedure Code(s):        --- Professional ---                           [phone], Esophagogastroduodenoscopy, flexible,                            transoral; with biopsy, single or multiple Diagnosis Code(s):        --- Professional ---                           K22.2, Esophageal obstruction                           K44.9,  Diaphragmatic hernia without obstruction or                            gangrene                           K92.2, Gastrointestinal hemorrhage, unspecified                           K25.9, Gastric ulcer, unspecified as acute or                            chronic, without hemorrhage or perforation CPT copyright 2019 [employer]. All rights reserved. The codes documented in this report are preliminary and upon coder review may  be revised to meet current compliance requirements. Kathi Der, MD Kathi Der, MD 10/05/2019 9:38:09 AM Number of Addenda: 0

## 2019-10-05 NOTE — Transfer of Care (Signed)
Immediate Anesthesia Transfer of Care Note  Patient: Michelle Graves  Procedure(s) Performed: ESOPHAGOGASTRODUODENOSCOPY (EGD) WITH PROPOFOL (N/A ) COLONOSCOPY WITH PROPOFOL (N/A ) BIOPSY  Patient Location: Endoscopy Unit  Anesthesia Type:MAC  Level of Consciousness: awake, alert  and oriented  Airway & Oxygen Therapy: Patient Spontanous Breathing and Patient connected to nasal cannula oxygen  Post-op Assessment: Report given to RN and Post -op Vital signs reviewed and stable  Post vital signs: Reviewed and stable  Last Vitals:  Vitals Value Taken Time  BP    Temp    Pulse    Resp    SpO2      Last Pain:  Vitals:   10/05/19 0819  TempSrc: Temporal  PainSc: 0-No pain         Complications: No apparent anesthesia complications

## 2019-10-05 NOTE — Brief Op Note (Signed)
09/30/2019 - 10/05/2019  9:56 AM  PATIENT:  Michelle Graves  68 y.o. female  PRE-OPERATIVE DIAGNOSIS:  Anemia  POST-OPERATIVE DIAGNOSIS:  EGD: Hiatal Hernia, gastric erosion biospy.  Colon: transverse colon colitis biopsy, poor prep unable to reach cecum   PROCEDURE:  Procedure(s): ESOPHAGOGASTRODUODENOSCOPY (EGD) WITH PROPOFOL (N/A) COLONOSCOPY WITH PROPOFOL (N/A) BIOPSY  SURGEON:  Surgeon(s) and Role:    * Eriana Suliman, MD - Primary  Findings ---------- -EGD showed antral erosions, no ulcers.  Small hiatal hernia. -Colonoscopy showed poor prep despite of 2 days of attempting to clean it out with the bowel prep, it also showed mild inflammation in the distal transverse colon, biopsies taken.  Recommendations -------------------------- -Start soft diet advance as tolerated -Okay to resume Plavix -Findings does not look like CMV colitis, but we will await biopsies. -She may need a repeat colonoscopy or virtual colonoscopy at some point for  complete visualization of her colon. -GI will follow  Otis Brace MD, [place] 10/05/2019, 9:58 AM  Contact #  [phone]

## 2019-10-05 NOTE — Progress Notes (Signed)
Pt has not had any bowel movements since 7pm last night. She had one episode of vomiting around 2230 but none since then. Pt states that her abdominal pain is better this morning. She says, "Im not hurting much right now."

## 2019-10-05 NOTE — Op Note (Addendum)
[facility] Patient Name: Michelle MinaBarbara Graves Procedure Date : 10/05/2019 MRN: [id number] Attending MD: Kathi DerParag Daelyn Pettaway , MD Date of Birth: 09/23/1951 CSN: [id number] Age: [age] Admit Type: Inpatient Procedure:                Colonoscopy Indications:              Follow-up of colitis Providers:                Kathi DerParag Kelen Laura, MD, Glory RosebushJulie Gibson, RN, Blenda MountsZharia                            Burton, RN, Arlee Muslimhris Chandler Tech., Technician, Koren Bounduy                            Nguyen, Technician, Arva ChafeBlaire Harder, CRNA Referring MD:              Medicines:                Sedation Administered by an Anesthesia Professional Complications:            No immediate complications. Estimated Blood Loss:     Estimated blood loss was minimal. Procedure:                Pre-Anesthesia Assessment:                           - Prior to the procedure, a History and Physical                            was performed, and patient medications and                            allergies were reviewed. The patient's tolerance of                            previous anesthesia was also reviewed. The risks                            and benefits of the procedure and the sedation                            options and risks were discussed with the patient.                            All questions were answered, and informed consent                            was obtained. Prior Anticoagulants: The patient has                            taken Plavix (clopidogrel), last dose was 5 days                            prior to procedure. ASA Grade Assessment: III - A  patient with severe systemic disease. After                            reviewing the risks and benefits, the patient was                            deemed in satisfactory condition to undergo the                            procedure.                           After obtaining informed consent, the colonoscope                            was passed  under direct vision. Throughout the                            procedure, the patient's blood pressure, pulse, and                            oxygen saturations were monitored continuously. The                            PCF-H190DL ([id number] Olympus pediatric colonscope                            was introduced through the anus with the intention                            of advancing to the cecum. The scope was advanced                            to the transverse colon before the procedure was                            aborted. Medications were given. The colonoscopy                            was technically difficult and complex due to poor                            bowel prep with stool present. The patient                            tolerated the procedure well. The quality of the                            bowel preparation was poor. Scope In: 9:11:49 AM Scope Out: 9:24:24 AM Total Procedure Duration: 0 hours 12 minutes 35 seconds  Findings:      The perianal and digital rectal examinations were normal. [Pertinent       Negatives].      A large amount of semi-solid solid stool was found in the rectum,  in the       recto-sigmoid colon, in the sigmoid colon, in the descending colon and       in the transverse colon, interfering with visualization. The scope was       advanced to the transverse colon before the procedure was aborted.      An area of moderately inflamed and ulcerated mucosa was found in the       distal transverse colon. Biopsies were taken with a cold forceps for       histology.      Normal mucosa was found in the rectum, in the recto-sigmoid colon, in       the sigmoid colon and in the descending colon.      Internal hemorrhoids were found during retroflexion. The hemorrhoids       were small. Impression:               - Preparation of the colon was poor.                           - Stool in the rectum, in the recto-sigmoid colon,                            in  the sigmoid colon, in the descending colon and                            in the transverse colon.                           - Inflamed and ulcerated mucosa in the distal                            transverse colon. Biopsied.                           - Normal mucosa in the rectum, in the recto-sigmoid                            colon, in the sigmoid colon and in the descending                            colon.                           - Internal hemorrhoids. Recommendation:           - Return patient to hospital ward for ongoing care.                           - Soft diet.                           - Continue present medications.                           - Await pathology results.                           - Repeat colonoscopy or  Virtual Colonoscopy at                            appointment to be scheduled because the bowel                            preparation was suboptimal. Procedure Code(s):        --- Professional ---                           [phone], 68, Colonoscopy, flexible; with biopsy,                            single or multiple Diagnosis Code(s):        --- Professional ---                           K64.8, Other hemorrhoids                           K52.9, Noninfective gastroenteritis and colitis,                            unspecified                           K63.3, Ulcer of intestine                           K51.90, Ulcerative colitis, unspecified, without                            complications CPT copyright 2019 American Medical Association. All rights reserved. The codes documented in this report are preliminary and upon coder review may  be revised to meet current compliance requirements. Otis Brace, MD Otis Brace, MD 10/05/2019 9:53:17 AM Number of Addenda: 0

## 2019-10-06 ENCOUNTER — Other Ambulatory Visit: Payer: Self-pay | Admitting: Physician Assistant

## 2019-10-06 DIAGNOSIS — B9620 Unspecified Escherichia coli [E. coli] as the cause of diseases classified elsewhere: Secondary | ICD-10-CM

## 2019-10-06 DIAGNOSIS — N390 Urinary tract infection, site not specified: Secondary | ICD-10-CM

## 2019-10-06 DIAGNOSIS — D638 Anemia in other chronic diseases classified elsewhere: Secondary | ICD-10-CM

## 2019-10-06 DIAGNOSIS — Z6827 Body mass index (BMI) 27.0-27.9, adult: Secondary | ICD-10-CM

## 2019-10-06 LAB — BASIC METABOLIC PANEL
Anion gap: 10 (ref 5–15)
BUN: 8 mg/dL (ref 8–23)
CO2: 20 mmol/L — ABNORMAL LOW (ref 22–32)
Calcium: 9 mg/dL (ref 8.9–10.3)
Chloride: 109 mmol/L (ref 98–111)
Creatinine, Ser: 1.24 mg/dL — ABNORMAL HIGH (ref 0.44–1.00)
GFR calc Af Amer: 52 mL/min — ABNORMAL LOW (ref >60)
GFR calc non Af Amer: 45 mL/min — ABNORMAL LOW (ref >60)
Glucose, Bld: 127 mg/dL — ABNORMAL HIGH (ref 70–99)
Potassium: 3.7 mmol/L (ref 3.5–5.1)
Sodium: 139 mmol/L (ref 135–145)

## 2019-10-06 LAB — CBC
HCT: 29.3 % — ABNORMAL LOW (ref 36.0–46.0)
Hemoglobin: 9.5 g/dL — ABNORMAL LOW (ref 12.0–15.0)
MCH: 29.4 pg (ref 26.0–34.0)
MCHC: 32.4 g/dL (ref 30.0–36.0)
MCV: 90.7 fL (ref 80.0–100.0)
Platelets: 291 10*3/uL (ref 150–400)
RBC: 3.23 MIL/uL — ABNORMAL LOW (ref 3.87–5.11)
RDW: 13.1 % (ref 11.5–15.5)
WBC: 7.9 10*3/uL (ref 4.0–10.5)
nRBC: 0 % (ref 0.0–0.2)

## 2019-10-06 NOTE — Progress Notes (Signed)
Patient discharged to home with instructions. 

## 2019-10-06 NOTE — Progress Notes (Signed)
   Subjective:  No events overnight. Pt reports feeling better this morning. Pt was able to eat well yesterday. Pt no longer endorsing abdominal pain. Pt states she is ready to head home.  Objective:  Vital signs in last 24 hours: Vitals:   10/05/19 1001 10/05/19 1034 10/05/19 1410 10/05/19 2040  BP: (!) 175/98 (!) 158/100 (!) 162/98 (!) 153/104  Pulse: 92 96 98 97  Resp: 18 18 19 18   Temp:   99.1 F (37.3 C) 99 F (37.2 C)  TempSrc:   Oral Oral  SpO2: 97% 100% 99% 100%  Weight:      Height:       Weight change:   Intake/Output Summary (Last 24 hours) at 10/06/2019 3570 Last data filed at 10/05/2019 0935 Gross per 24 hour  Intake 300 ml  Output -  Net 300 ml   Physical Exam Constitutional:      General: She is not in acute distress. Pulmonary:     Effort: Pulmonary effort is normal.  Abdominal:     General: Abdomen is flat.  Neurological:     General: No focal deficit present.     Mental Status: She is alert.  Psychiatric:        Mood and Affect: Mood normal.    Labs: Cr: 1.24, GFR 52 Hgb: 9.5, Hct 29.3  Assessment/Plan:  Principal Problem:   Colitis Active Problems:   AKI (acute kidney injury) ([place])   Anemia, chronic disease   Malnutrition of moderate degree  [name] is a 68 year old female with a medical history of rheumatoid arthritis, history of CVA, CKD 3 and CMV colitis during immunosuppressant treatment who presents with abdominal pain and hematochezia, and associated nausea/vomiting,with imaging that revealed wall thickening and fat stranding concerning for colitis of unknown etiology, with new colonoscopy showing findings low likelihood of CMVcolitis. Pt is hemodynamically stable and clinically improving.  Colitis: Ischemic vs. Infectious vs. ulcerative colitis remain likely etiologies and appears to be chronic in nature. - Colon biopsy pending - Progress diet as tolerated by pt  Anemia: Hcb 10.0->9.5. most likely anemia of inflammatory  disease. Symptoms suggest inflammation is resolving - Continue to monitor - Advance diet as tolerated  AKI on CKD 3: GFR 44->52, Cr 1.42->1.24.AKI is improving again. hyperchloremia resolved and acidosis improving -Continue to monitor with I/O's - Advance diet as tolerated  Malnutrition of moderate degree: Abdominal pain is resolving, Pt did well on soft diet - Dietician on board, greatly appreciate reccomendations - Advance diet to as tolerated   LOS: 6 days   Michelle Graves, Medical Student 10/06/2019, 6:08 AM

## 2019-10-06 NOTE — Progress Notes (Signed)
[facility] Progress Note  Michelle Graves 68 y.o. 1951-04-02  CC: Abdominal pain, colitis   Subjective: Patient seen and examined at bedside.  Resting comfortably in the bed.  She denies any acute abdominal pain, nausea and vomiting.  Denies diarrhea   ROS : Afebrile.  Negative for acute chest pain   Objective: Vital signs in last 24 hours: Vitals:   10/05/19 2040 10/06/19 0618  BP: (!) 153/104 (!) 151/97  Pulse: 97 95  Resp: 18 18  Temp: 99 F (37.2 C) 98.7 F (37.1 C)  SpO2: 100% 100%    Physical Exam:  General:  Alert, cooperative, no distress, appears stated age  Head:  Normocephalic, without obvious abnormality, atraumatic  Eyes:  , EOM's intact,   Lungs:   Clear to auscultation bilaterally, respirations unlabored  Heart:  Regular rate and rhythm, S1, S2 normal  Abdomen:   Soft, NT, ND, bowel sounds present, no peritoneal signs  Extremities: Extremities normal, atraumatic, no  edema       Lab Results: Recent Labs    10/05/19 0259 10/06/19 0252  NA 141 139  K 4.0 3.7  CL 113* 109  CO2 18* 20*  GLUCOSE 79 127*  BUN 9 8  CREATININE 1.43* 1.24*  CALCIUM 9.0 9.0  MG 1.4*  --    No results for input(s): AST, ALT, ALKPHOS, BILITOT, PROT, ALBUMIN in the last 72 hours. Recent Labs    10/05/19 0259 10/06/19 0252  WBC 7.8 7.9  HGB 10.0* 9.5*  HCT 30.5* 29.3*  MCV 90.5 90.7  PLT 293 291   No results for input(s): LABPROT, INR in the last 72 hours.    Assessment/Plan: -Abnormal CT scan concerning for colitis.  Colonoscopy in June 2020 showed severe ischemic colitis. -History of gastric ulcer -Weight loss -Abdominal pain.  Could be from colitis -History of CVA  Recommendations -------------------------- -Unfortunately, despite of trying to do the bowel prep for 2 days, she had a poor prep and procedure was aborted.  Scope was advanced up to the distal transverse colon where mild colitis was noted.  Biopsies pending.  Does not appear to  be CMV related.  -EGD yesterday showed resolution of ulcer and few gastric erosions.  Recommend once a day PPI/Protonix 40 mg for now.  Avoid NSAIDs  -She is afebrile.  She has no leukocytosis.  I think it is okay to DC antibiotics on discharge.  -Recommend repeat colonoscopy or virtual colonoscopy as an outpatient in the next few months after resolution of colitis.  -No further inpatient GI work-up planned.  GI will sign off.  Okay to discharge from GI standpoint.  Follow-up in GI clinic in 6 weeks.    Otis Brace MD, [place] 10/06/2019, 8:50 AM  Contact #  [phone]

## 2019-10-07 LAB — SURGICAL PATHOLOGY

## 2019-10-11 ENCOUNTER — Other Ambulatory Visit: Payer: Self-pay

## 2019-10-11 ENCOUNTER — Ambulatory Visit
Admission: RE | Admit: 2019-10-11 | Discharge: 2019-10-11 | Disposition: A | Discharge status: Home / Self Care | Payer: Medicare Other | Source: Ambulatory Visit | Attending: Physician Assistant | Admitting: Physician Assistant

## 2019-10-11 DIAGNOSIS — Z1231 Encounter for screening mammogram for malignant neoplasm of breast: Secondary | ICD-10-CM

## 2019-10-13 ENCOUNTER — Ambulatory Visit: Payer: Medicare Other

## 2019-10-26 ENCOUNTER — Other Ambulatory Visit: Payer: Self-pay

## 2019-10-26 ENCOUNTER — Emergency Department (HOSPITAL_COMMUNITY): Payer: Medicare Other

## 2019-10-26 ENCOUNTER — Inpatient Hospital Stay (HOSPITAL_COMMUNITY)
Admission: EM | Admit: 2019-10-26 | Discharge: 2019-10-28 | DRG: 683 | Disposition: A | Discharge status: Home Health | Payer: Medicare Other | Attending: Internal Medicine | Admitting: Internal Medicine

## 2019-10-26 ENCOUNTER — Encounter (HOSPITAL_COMMUNITY): Payer: Self-pay | Admitting: Emergency Medicine

## 2019-10-26 DIAGNOSIS — R339 Retention of urine, unspecified: Secondary | ICD-10-CM

## 2019-10-26 DIAGNOSIS — E872 Acidosis: Secondary | ICD-10-CM | POA: Diagnosis present

## 2019-10-26 DIAGNOSIS — Z20822 Contact with and (suspected) exposure to covid-19: Secondary | ICD-10-CM | POA: Diagnosis present

## 2019-10-26 DIAGNOSIS — I129 Hypertensive chronic kidney disease with stage 1 through stage 4 chronic kidney disease, or unspecified chronic kidney disease: Secondary | ICD-10-CM | POA: Diagnosis present

## 2019-10-26 DIAGNOSIS — M1990 Unspecified osteoarthritis, unspecified site: Secondary | ICD-10-CM | POA: Diagnosis present

## 2019-10-26 DIAGNOSIS — Z8249 Family history of ischemic heart disease and other diseases of the circulatory system: Secondary | ICD-10-CM

## 2019-10-26 DIAGNOSIS — N189 Chronic kidney disease, unspecified: Secondary | ICD-10-CM | POA: Diagnosis present

## 2019-10-26 DIAGNOSIS — N179 Acute kidney failure, unspecified: Principal | ICD-10-CM | POA: Diagnosis present

## 2019-10-26 DIAGNOSIS — Z8673 Personal history of transient ischemic attack (TIA), and cerebral infarction without residual deficits: Secondary | ICD-10-CM

## 2019-10-26 DIAGNOSIS — N183 Chronic kidney disease, stage 3 unspecified: Secondary | ICD-10-CM | POA: Diagnosis present

## 2019-10-26 DIAGNOSIS — M069 Rheumatoid arthritis, unspecified: Secondary | ICD-10-CM | POA: Diagnosis present

## 2019-10-26 DIAGNOSIS — R29898 Other symptoms and signs involving the musculoskeletal system: Secondary | ICD-10-CM | POA: Diagnosis present

## 2019-10-26 DIAGNOSIS — Z7982 Long term (current) use of aspirin: Secondary | ICD-10-CM

## 2019-10-26 DIAGNOSIS — R319 Hematuria, unspecified: Secondary | ICD-10-CM | POA: Diagnosis not present

## 2019-10-26 DIAGNOSIS — I10 Essential (primary) hypertension: Secondary | ICD-10-CM | POA: Diagnosis present

## 2019-10-26 DIAGNOSIS — Z833 Family history of diabetes mellitus: Secondary | ICD-10-CM

## 2019-10-26 DIAGNOSIS — E860 Dehydration: Secondary | ICD-10-CM | POA: Diagnosis present

## 2019-10-26 LAB — URINALYSIS, ROUTINE W REFLEX MICROSCOPIC
Bilirubin Urine: NEGATIVE
Glucose, UA: NEGATIVE mg/dL
Hgb urine dipstick: NEGATIVE
Ketones, ur: NEGATIVE mg/dL
Leukocytes,Ua: NEGATIVE
Nitrite: NEGATIVE
Protein, ur: NEGATIVE mg/dL
Specific Gravity, Urine: 1.02 (ref 1.005–1.030)
pH: 6 (ref 5.0–8.0)

## 2019-10-26 LAB — COMPREHENSIVE METABOLIC PANEL
ALT: 19 U/L (ref 0–44)
AST: 19 U/L (ref 15–41)
Albumin: 4 g/dL (ref 3.5–5.0)
Alkaline Phosphatase: 61 U/L (ref 38–126)
Anion gap: 14 (ref 5–15)
BUN: 42 mg/dL — ABNORMAL HIGH (ref 8–23)
CO2: 21 mmol/L — ABNORMAL LOW (ref 22–32)
Calcium: 10.3 mg/dL (ref 8.9–10.3)
Chloride: 104 mmol/L (ref 98–111)
Creatinine, Ser: 3.41 mg/dL — ABNORMAL HIGH (ref 0.44–1.00)
GFR calc Af Amer: 15 mL/min — ABNORMAL LOW (ref >60)
GFR calc non Af Amer: 13 mL/min — ABNORMAL LOW (ref >60)
Glucose, Bld: 98 mg/dL (ref 70–99)
Potassium: 3.7 mmol/L (ref 3.5–5.1)
Sodium: 139 mmol/L (ref 135–145)
Total Bilirubin: 1 mg/dL (ref 0.3–1.2)
Total Protein: 7.7 g/dL (ref 6.5–8.1)

## 2019-10-26 LAB — CBC
HCT: 30.1 % — ABNORMAL LOW (ref 36.0–46.0)
Hemoglobin: 9.7 g/dL — ABNORMAL LOW (ref 12.0–15.0)
MCH: 29.8 pg (ref 26.0–34.0)
MCHC: 32.2 g/dL (ref 30.0–36.0)
MCV: 92.6 fL (ref 80.0–100.0)
Platelets: 232 10*3/uL (ref 150–400)
RBC: 3.25 MIL/uL — ABNORMAL LOW (ref 3.87–5.11)
RDW: 14.6 % (ref 11.5–15.5)
WBC: 8.1 10*3/uL (ref 4.0–10.5)
nRBC: 0 % (ref 0.0–0.2)

## 2019-10-26 LAB — TYPE AND SCREEN
ABO/RH(D): O POS
Antibody Screen: NEGATIVE

## 2019-10-26 LAB — SARS CORONAVIRUS 2 (TAT 6-24 HRS): SARS Coronavirus 2: NEGATIVE

## 2019-10-26 MED ORDER — DOXEPIN HCL 25 MG PO CAPS
25.0000 mg | ORAL_CAPSULE | Freq: Every day | ORAL | Status: DC
  Filled 2019-10-26 (×2): qty 1

## 2019-10-26 MED ORDER — ASPIRIN EC 81 MG PO TBEC
81.0000 mg | DELAYED_RELEASE_TABLET | Freq: Every day | ORAL | Status: DC
  Administered 2019-10-27 – 2019-10-28 (×2): 81 mg via ORAL
  Filled 2019-10-26 (×3): qty 1

## 2019-10-26 MED ORDER — TIZANIDINE HCL 2 MG PO TABS
4.0000 mg | ORAL_TABLET | Freq: Two times a day (BID) | ORAL | Status: DC
  Administered 2019-10-26 – 2019-10-28 (×4): 4 mg via ORAL
  Filled 2019-10-26 (×4): qty 2

## 2019-10-26 MED ORDER — IOHEXOL 350 MG/ML SOLN: 100.0000 mL | Freq: Once | INTRAVENOUS | Status: DC | PRN

## 2019-10-26 MED ORDER — OXYBUTYNIN CHLORIDE ER 10 MG PO TB24
10.0000 mg | ORAL_TABLET | Freq: Every day | ORAL | Status: DC
  Administered 2019-10-26: 10 mg via ORAL
  Filled 2019-10-26 (×2): qty 1

## 2019-10-26 MED ORDER — ENSURE ENLIVE PO LIQD
237.0000 mL | Freq: Three times a day (TID) | ORAL | Status: DC
  Administered 2019-10-26 – 2019-10-27 (×4): 237 mL via ORAL
  Filled 2019-10-26: qty 237

## 2019-10-26 MED ORDER — ENOXAPARIN SODIUM 30 MG/0.3ML ~~LOC~~ SOLN
30.0000 mg | SUBCUTANEOUS | Status: DC
  Administered 2019-10-27: 30 mg via SUBCUTANEOUS
  Filled 2019-10-26: qty 0.3

## 2019-10-26 MED ORDER — POLYETHYLENE GLYCOL 3350 17 G PO PACK: 17.0000 g | PACK | Freq: Every day | ORAL | Status: DC | PRN

## 2019-10-26 MED ORDER — SODIUM CHLORIDE 0.9 % IV SOLN: INTRAVENOUS | Status: DC

## 2019-10-26 MED ORDER — COLESTIPOL HCL 1 G PO TABS
1.0000 g | ORAL_TABLET | Freq: Two times a day (BID) | ORAL | Status: DC
  Administered 2019-10-27 – 2019-10-28 (×3): 1 g via ORAL
  Filled 2019-10-26 (×5): qty 1

## 2019-10-26 MED ORDER — AMLODIPINE BESYLATE 10 MG PO TABS
10.0000 mg | ORAL_TABLET | Freq: Every day | ORAL | Status: DC
  Administered 2019-10-27 – 2019-10-28 (×2): 10 mg via ORAL
  Filled 2019-10-26 (×2): qty 1

## 2019-10-26 MED ORDER — ACETAMINOPHEN 650 MG RE SUPP: 650.0000 mg | Freq: Four times a day (QID) | RECTAL | Status: DC | PRN

## 2019-10-26 MED ORDER — MEGESTROL ACETATE 400 MG/10ML PO SUSP
400.0000 mg | Freq: Two times a day (BID) | ORAL | Status: DC
  Administered 2019-10-27 – 2019-10-28 (×3): 400 mg via ORAL
  Filled 2019-10-26 (×5): qty 10

## 2019-10-26 MED ORDER — METOPROLOL TARTRATE 100 MG PO TABS
100.0000 mg | ORAL_TABLET | Freq: Every day | ORAL | Status: DC
  Administered 2019-10-27 – 2019-10-28 (×2): 100 mg via ORAL
  Filled 2019-10-26 (×2): qty 1

## 2019-10-26 MED ORDER — ACETAMINOPHEN 325 MG PO TABS: 650.0000 mg | ORAL_TABLET | Freq: Four times a day (QID) | ORAL | Status: DC | PRN

## 2019-10-26 MED ORDER — DULOXETINE HCL 60 MG PO CPEP
60.0000 mg | ORAL_CAPSULE | Freq: Every day | ORAL | Status: DC
  Administered 2019-10-26 – 2019-10-27 (×2): 60 mg via ORAL
  Filled 2019-10-26 (×3): qty 1

## 2019-10-26 MED ORDER — PRAZOSIN HCL 1 MG PO CAPS
1.0000 mg | ORAL_CAPSULE | Freq: Every day | ORAL | Status: DC
  Administered 2019-10-27: 1 mg via ORAL
  Filled 2019-10-26 (×3): qty 1

## 2019-10-26 MED ORDER — CLOPIDOGREL BISULFATE 75 MG PO TABS
75.0000 mg | ORAL_TABLET | Freq: Every day | ORAL | Status: DC
  Administered 2019-10-27 – 2019-10-28 (×2): 75 mg via ORAL
  Filled 2019-10-26 (×2): qty 1

## 2019-10-26 MED ORDER — ADULT MULTIVITAMIN W/MINERALS CH
1.0000 | ORAL_TABLET | Freq: Every day | ORAL | Status: DC
  Administered 2019-10-27 – 2019-10-28 (×2): 1 via ORAL
  Filled 2019-10-26 (×2): qty 1

## 2019-10-26 MED ORDER — SODIUM CHLORIDE 0.9% FLUSH: 3.0000 mL | Freq: Two times a day (BID) | INTRAVENOUS | Status: DC

## 2019-10-26 MED ORDER — HYDROXYCHLOROQUINE SULFATE 200 MG PO TABS
200.0000 mg | ORAL_TABLET | Freq: Two times a day (BID) | ORAL | Status: DC
  Administered 2019-10-26 – 2019-10-28 (×4): 200 mg via ORAL
  Filled 2019-10-26 (×5): qty 1

## 2019-10-26 MED ORDER — SODIUM CHLORIDE 0.9 % IV BOLUS
1000.0000 mL | Freq: Once | INTRAVENOUS | Status: AC
  Administered 2019-10-26: 1000 mL via INTRAVENOUS

## 2019-10-26 MED ORDER — CLONIDINE HCL 0.2 MG PO TABS
0.3000 mg | ORAL_TABLET | Freq: Two times a day (BID) | ORAL | Status: DC
  Administered 2019-10-26 – 2019-10-28 (×4): 0.3 mg via ORAL
  Filled 2019-10-26 (×4): qty 1

## 2019-10-26 MED ORDER — DOCUSATE SODIUM 100 MG PO CAPS
100.0000 mg | ORAL_CAPSULE | Freq: Two times a day (BID) | ORAL | Status: DC
  Administered 2019-10-26 – 2019-10-28 (×4): 100 mg via ORAL
  Filled 2019-10-26 (×4): qty 1

## 2019-10-26 NOTE — ED Notes (Signed)
Attempted report. Rn will call back. Giving meds.

## 2019-10-26 NOTE — ED Notes (Signed)
Help get patient on the monitor placed a external cath patient is resting with call bell in reach

## 2019-10-26 NOTE — Progress Notes (Signed)
Patient admitted to [phone]. Alert and orientedx4. VSS stable, Skin WNL. Denies pain. Oriented to room and call bell.

## 2019-10-26 NOTE — ED Notes (Signed)
IV stated they can't put IV in pt due to no medications ordered. Pt has been stuck twice for labs with no success.

## 2019-10-26 NOTE — H&P (Signed)
Date: 10/26/2019               Patient Name:  Michelle Graves MRN: [id number]  DOB: 1951-03-31 Age / Sex: 68 y.o., female   PCP: [facility]         Medical Service: Internal Medicine Teaching Service         Attending Physician: Dr. Mikey Bussing, Marthenia Rolling, DO    First Contact: Dr. Huel Cote  Pager: [phone]  Second Contact: Dr. 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   Michelle Graves states that she has been having increasing generalized weakness for a while now but it has gotten worse in the recent days. She states this morning, she had difficulty lifting her feet and had to slide them to walk. She felt like her feet may slide from underneath her. She also notes bilateral lower back pain that radiates to her lower extremities. She has bene working with PT twice per week since last admission and they have noted her increased weakness as well. Denies headaches, dizziness.   Michelle Graves notes decreased urine output in the past 3-4 weeks. Previously, she had increased urine output but then her PCP reduced her dose of Oxybutynin. She notes increased straining to urinate and feels her bladder is not completely emptied when done. Sometimes she will strain for up to 30 minutes. Patient denies dysuria, fever, abdominal pain. No change in fluid intake recently. No recent illnesses since last admission in the beginning of December.  Notes occasional SOB with exertion that resolves with rest. Denies CP or bowel movement changes.   ED Course:  CMP notable for elevated creatinine of 3.41 and BUN of 42. CBC notable for stable chronic  anemia, no leukocytosis. UA completely normal. CT renal study showed chronic colon changes near splenic flexure and a large stool burden. Patient had fecal disimpaction in the ED with large stool output.   Meds:  Current Meds  Medication Sig  . amLODipine (NORVASC) 10 MG tablet Take 10 mg by mouth daily.  Marland Kitchen aspirin EC 81 MG EC tablet Take 1 tablet (81 mg total) by mouth daily.  . cloNIDine (CATAPRES) 0.3 MG tablet Take 0.3 mg by mouth 2 (two) times daily.  . clopidogrel (PLAVIX) 75 MG tablet Take 1 tablet (75 mg total) by mouth daily.  . colestipol (COLESTID) 1 g tablet Take 1 tablet (1 g total) by mouth 2 (two) times daily.  Marland Kitchen docusate sodium (COLACE) 100 MG capsule Take 1 capsule (100 mg total) by mouth 2 (two) times daily.  Marland Kitchen doxepin (SINEQUAN) 25 MG capsule Take 25 mg by mouth at bedtime.  . DULoxetine (CYMBALTA) 60 MG capsule Take 60 mg by mouth at bedtime.   . feeding supplement, ENSURE ENLIVE, (ENSURE ENLIVE) LIQD Take 237 mLs by mouth 3 (three) times daily between meals.  . furosemide (LASIX) 20 MG tablet Take 20 mg by mouth daily.  . hydrochlorothiazide (HYDRODIURIL) 25 MG tablet Take 25 mg by mouth daily.  Marland Kitchen HYDROcodone-acetaminophen (NORCO/VICODIN) 5-325 MG tablet Take 1 tablet by mouth 3 (three) times daily as needed.  Marland Kitchen  hydroxychloroquine (PLAQUENIL) 200 MG tablet Take 200 mg by mouth 2 (two) times daily.   Marland Kitchen lisinopril (ZESTRIL) 40 MG tablet Take 40 mg by mouth daily.  . megestrol (MEGACE) 400 MG/10ML suspension Take 10 mLs (400 mg total) by mouth 2 (two) times daily.  . metoprolol tartrate (LOPRESSOR) 100 MG tablet Take 100 mg by mouth daily.   . Multiple Vitamin (MULTIVITAMIN WITH MINERALS) TABS tablet Take 1 tablet by mouth daily.  Marland Kitchen MYRBETRIQ 25 MG TB24 tablet Take 25 mg by mouth daily.  Marland Kitchen omeprazole (PRILOSEC) 20 MG capsule Take 20 mg by mouth as needed (acid reflux).   . ondansetron (ZOFRAN) 4 MG tablet Take 1 tablet (4 mg total) by mouth every 6 (six) hours as needed for  nausea. (Patient taking differently: Take 4 mg by mouth every 6 (six) hours as needed for nausea or vomiting. )  . oxybutynin (DITROPAN-XL) 10 MG 24 hr tablet Take 10 mg by mouth at bedtime.  . prazosin (MINIPRESS) 1 MG capsule Take 1 mg by mouth at bedtime.  Marland Kitchen tiZANidine (ZANAFLEX) 4 MG tablet Take 4 mg by mouth 2 (two) times daily.   Allergies: Allergies as of 10/26/2019  . (No Known Allergies)   Past Medical History:  Diagnosis Date  . Arthritis   . Chest pain   . Diverticulitis   . Hypertension   . Stroke [facility])    Family History:  Family History  Problem Relation Age of Onset  . Hypertension Mother   . Diabetes Mother   . CAD Father        died of MI at age 24  . Hypertension Father   . Diabetes Sister   . Diabetes Sister   . Kidney disease Neg Hx    Social History:  Denies tobacco or drug use.  Alcohol use: [employer] every 6 months or so   Review of Systems: A complete ROS was negative except as per HPI.   Physical Exam: Blood pressure 128/87, pulse 67, temperature 98 F (36.7 C), temperature source Oral, resp. rate 16, height 5\' 7"  (1.702 m), weight 80.8 kg, SpO2 97 %.  Physical Exam Vitals and nursing note reviewed.  Constitutional:      General: She is not in acute distress.    Appearance: Normal appearance.  HENT:     Mouth/Throat:     Mouth: Mucous membranes are dry.     Pharynx: Oropharynx is clear.  Eyes:     Extraocular Movements: Extraocular movements intact.     Conjunctiva/sclera: Conjunctivae normal.     Pupils: Pupils are equal, round, and reactive to light.  Cardiovascular:     Rate and Rhythm: Normal rate and regular rhythm.     Pulses: Normal pulses.     Heart sounds: Normal heart sounds. No murmur.  Pulmonary:     Effort: Pulmonary effort is normal. No respiratory distress.     Breath sounds: Normal breath sounds. No wheezing, rhonchi or rales.  Abdominal:     General: Bowel sounds are normal. There is no distension.     Palpations:  Abdomen is soft.     Tenderness: There is no abdominal tenderness. There is no guarding.  Musculoskeletal:        General: No swelling or deformity.     Right lower leg: No edema.     Left lower leg: No edema.  Skin:    General: Skin is warm and dry.  Neurological:     General: No focal deficit present.  Mental Status: She is oriented to person, place, and time and easily aroused. Mental status is at baseline. She is lethargic.  Psychiatric:        Mood and Affect: Mood normal.        Behavior: Behavior normal. Behavior is cooperative.    EKG: personally reviewed my interpretation is: Sinus rhythm with rate approximately 70. No ST elevation or depression. No T wave inversions.    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   # Acute on Chronic Kidney Failure: BUN/Cr ratio of 12, which does support possible intrarenal cause for AKI, however UA was completely normal. Differential also includes pre-renal from decreased fluid intake given that patient has also been having increased generalized weakness. Plan for IVF resuscitation and repeat BMP in the AM.   - BMP - Urine sodium and creatinine  - IVF maintenance with NS @ 100 cc/hr for 12 hours  - Bladder scan   # Generalized weakness # Deconditioning  This has been ongoing problem and previously felt to be due to deconditioning. Recent worsening may be secondary to AKI. She has been receiving PT services at home.   - PT/OT evaluation    Dispo: Admit patient to Observation with expected length of stay less than 2 midnights.  Signed: Dr. Verdene Lennert Internal Medicine PGY-1  Pager: [phone] 10/26/2019, 8:05 PM

## 2019-10-26 NOTE — ED Notes (Signed)
Or has called for this pt  Pt is not undressed he has no iv  No permit and he is pacing in the room  This information was given to the person calling for him

## 2019-10-26 NOTE — ED Provider Notes (Signed)
[facility] EMERGENCY DEPARTMENT Provider Note   CSN: [id number] Arrival date & time: 10/26/19  1029     History Chief Complaint  Patient presents with  . GI Bleeding    Michelle Graves is a 68 y.o. female.  68 yo F with a chief complaints of hematuria.  Going on for couple weeks.  Patient feels like it is gotten worse and she has been much more fatigued than normal at home.  Describes some left-sided pain that goes from her side down to the left part of the abdomen and sometimes into the leg.  Denies nausea vomiting or diarrhea.  Asked specifically if she had blood in her stool and she said no its in her urine.  Has been feeling subjectively cold.  Denies fevers.   The history is provided by the patient.  Illness Severity:  Moderate Onset quality:  Gradual Duration:  2 weeks Timing:  Constant Progression:  Worsening Associated symptoms: no chest pain, no congestion, no fever, no headaches, no myalgias, no nausea, no rhinorrhea, no shortness of breath, no vomiting and no wheezing        Past Medical History:  Diagnosis Date  . Arthritis   . Chest pain   . Diverticulitis   . Hypertension   . Stroke [facility])     Patient Active Problem List   Diagnosis Date Noted  . Acute on chronic kidney failure (HCC) 10/27/2019  . Malnutrition of moderate degree 10/03/2019  . Colitis 09/30/2019  . AKI (acute kidney injury) (HCC) 05/28/2019  . Nausea & vomiting 05/28/2019  . 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 Anemia, chronic disease 05/28/2019  . Swelling   . Small vessel disease, cerebrovascular   . Acute ischemic stroke (HCC)   . General weakness   . Acute left ankle pain   . Acute on chronic renal failure (HCC) 05/13/2019  . Weakness of both lower extremities 05/13/2019  . Cerebral thrombosis with cerebral infarction 03/31/2019  . New onset a-fib (HCC) 03/27/2019  . Acute kidney injury superimposed on CKD (HCC) 03/24/2019  . Hypotension 03/24/2019  . Dark  emesis 03/24/2019  . Dark stools 03/24/2019  . Diverticulitis of intestine without perforation or abscess without bleeding   . Sinus tachycardia 08/27/2016  . Diverticulitis 08/26/2016  . HYPERTENSION, MALIGNANT ESSENTIAL 06/24/2007  . KIDNEY DISEASE, CHRONIC, STAGE III 06/24/2007  . ARTHRITIS, RHEUMATOID, SEROPOSITIVE 06/24/2007    Past Surgical History:  Procedure Laterality Date  . BIOPSY  04/01/2019   Procedure: BIOPSY;  Surgeon: Kathi Der, MD;  Location: [facility] ENDOSCOPY;  Service: Gastroenterology;;  . BIOPSY  10/05/2019   Procedure: BIOPSY;  Surgeon: Kathi Der, MD;  Location: [facility] ENDOSCOPY;  Service: Gastroenterology;;  . COLONOSCOPY WITH PROPOFOL N/A 04/01/2019   Procedure: COLONOSCOPY WITH PROPOFOL;  Surgeon: Kathi Der, MD;  Location: [facility] ENDOSCOPY;  Service: Gastroenterology;  Laterality: N/A;  . COLONOSCOPY WITH PROPOFOL N/A 10/05/2019   Procedure: COLONOSCOPY WITH PROPOFOL;  Surgeon: Kathi Der, MD;  Location: [facility] ENDOSCOPY;  Service: Gastroenterology;  Laterality: N/A;  . ESOPHAGOGASTRODUODENOSCOPY (EGD) WITH PROPOFOL N/A 04/01/2019   Procedure: ESOPHAGOGASTRODUODENOSCOPY (EGD) WITH PROPOFOL;  Surgeon: Kathi Der, MD;  Location: [facility] ENDOSCOPY;  Service: Gastroenterology;  Laterality: N/A;  . ESOPHAGOGASTRODUODENOSCOPY (EGD) WITH PROPOFOL N/A 10/05/2019   Procedure: ESOPHAGOGASTRODUODENOSCOPY (EGD) WITH PROPOFOL;  Surgeon: Kathi Der, MD;  Location: [facility] ENDOSCOPY;  Service: Gastroenterology;  Laterality: N/A;  . TONSILLECTOMY       OB History   No obstetric history on file.  Family History  Problem Relation Age of Onset  . Hypertension Mother   . Diabetes Mother   . CAD Father        died of MI at age 66  . Hypertension Father   . Diabetes Sister   . Diabetes Sister   . Kidney disease Neg Hx     Social History   Tobacco Use  . Smoking status: Never Smoker  . Smokeless tobacco: Never Used  Substance Use Topics  . Alcohol use:  Yes    Alcohol/week: 21.0 standard drinks    Types: 21 Glasses of wine per week    Comment: sometimes   . Drug use: No    Home Medications Prior to Admission medications   Medication Sig Start Date End Date Taking? Authorizing Provider  amLODipine (NORVASC) 10 MG tablet Take 10 mg by mouth daily. 08/19/19  Yes [provider]  aspirin EC 81 MG EC tablet Take 1 tablet (81 mg total) by mouth daily. 05/16/19  Yes Verdene LennertBasaraba, Iulia, MD  cloNIDine (CATAPRES) 0.3 MG tablet Take 0.3 mg by mouth 2 (two) times daily. 09/27/19  Yes [provider]  clopidogrel (PLAVIX) 75 MG tablet Take 1 tablet (75 mg total) by mouth daily. 04/05/19  Yes Glade LloydAlekh, Kshitiz, MD  colestipol (COLESTID) 1 g tablet Take 1 tablet (1 g total) by mouth 2 (two) times daily. 05/16/19  Yes Verdene LennertBasaraba, Iulia, MD  docusate sodium (COLACE) 100 MG capsule Take 1 capsule (100 mg total) by mouth 2 (two) times daily. 06/02/19  Yes Shahmehdi, Seyed A, MD  doxepin (SINEQUAN) 25 MG capsule Take 25 mg by mouth at bedtime. 10/18/19  Yes [provider]  DULoxetine (CYMBALTA) 60 MG capsule Take 60 mg by mouth at bedtime.    Yes [provider]  feeding supplement, ENSURE ENLIVE, (ENSURE ENLIVE) LIQD Take 237 mLs by mouth 3 (three) times daily between meals. 06/02/19  Yes Shahmehdi, Seyed A, MD  furosemide (LASIX) 20 MG tablet Take 20 mg by mouth daily. 10/13/19  Yes [provider]  hydrochlorothiazide (HYDRODIURIL) 25 MG tablet Take 25 mg by mouth daily. 09/27/19  Yes [provider]  HYDROcodone-acetaminophen (NORCO/VICODIN) 5-325 MG tablet Take 1 tablet by mouth 3 (three) times daily as needed. 10/14/19  Yes [provider]  hydroxychloroquine (PLAQUENIL) 200 MG tablet Take 200 mg by mouth 2 (two) times daily.  06/16/16  Yes [provider]  lisinopril (ZESTRIL) 40 MG tablet Take 40 mg by mouth daily. 08/16/19  Yes [provider]  megestrol (MEGACE) 400 MG/10ML suspension Take  10 mLs (400 mg total) by mouth 2 (two) times daily. 06/02/19  Yes Shahmehdi, Seyed A, MD  metoprolol tartrate (LOPRESSOR) 100 MG tablet Take 100 mg by mouth daily.  11/19/15  Yes [provider]  Multiple Vitamin (MULTIVITAMIN WITH MINERALS) TABS tablet Take 1 tablet by mouth daily. 06/03/19  Yes Shahmehdi, Seyed A, MD  MYRBETRIQ 25 MG TB24 tablet Take 25 mg by mouth daily. 10/18/19  Yes [provider]  omeprazole (PRILOSEC) 20 MG capsule Take 20 mg by mouth as needed (acid reflux).  08/22/19  Yes [provider]  ondansetron (ZOFRAN) 4 MG tablet Take 1 tablet (4 mg total) by mouth every 6 (six) hours as needed for nausea. Patient taking differently: Take 4 mg by mouth every 6 (six) hours as needed for nausea or vomiting.  04/04/19  Yes Glade LloydAlekh, Kshitiz, MD  oxybutynin (DITROPAN-XL) 10 MG 24 hr tablet Take 10 mg by mouth at  bedtime. 09/27/19  Yes [provider]  prazosin (MINIPRESS) 1 MG capsule Take 1 mg by mouth at bedtime. 09/20/19  Yes [provider]  tiZANidine (ZANAFLEX) 4 MG tablet Take 4 mg by mouth 2 (two) times daily. 08/22/19  Yes [provider]  pantoprazole (PROTONIX) 40 MG tablet Take 1 tablet (40 mg total) by mouth 2 (two) times daily. Patient not taking: Reported on 09/30/2019 04/04/19   Aline August, MD    Allergies    Patient has no known allergies.  Review of Systems   Review of Systems  Constitutional: Negative for chills and fever.  HENT: Negative for congestion and rhinorrhea.   Eyes: Negative for redness and visual disturbance.  Respiratory: Negative for shortness of breath and wheezing.   Cardiovascular: Negative for chest pain and palpitations.  Gastrointestinal: Negative for nausea and vomiting.  Genitourinary: Positive for flank pain and hematuria. Negative for dysuria and urgency.  Musculoskeletal: Negative for arthralgias and myalgias.  Skin: Negative for pallor and wound.  Neurological: Negative for dizziness and  headaches.    Physical Exam Updated Vital Signs BP 117/73 (BP Location: Right Arm)   Pulse 66   Temp 98.4 F (36.9 C) (Oral)   Resp 16   Ht 5\' 7"  (1.702 m)   Wt 80.8 kg   SpO2 100%   BMI 27.90 kg/m   Physical Exam Vitals and nursing note reviewed.  Constitutional:      General: She is not in acute distress.    Appearance: She is well-developed. She is not diaphoretic.  HENT:     Head: Normocephalic and atraumatic.  Eyes:     Pupils: Pupils are equal, round, and reactive to light.  Cardiovascular:     Rate and Rhythm: Normal rate and regular rhythm.     Heart sounds: No murmur. No friction rub. No gallop.   Pulmonary:     Effort: Pulmonary effort is normal.     Breath sounds: No wheezing or rales.  Abdominal:     General: There is no distension.     Palpations: Abdomen is soft.     Tenderness: There is no abdominal tenderness.  Musculoskeletal:        General: No tenderness.     Cervical back: Normal range of motion and neck supple.  Skin:    General: Skin is warm and dry.  Neurological:     Mental Status: She is alert and oriented to person, place, and time.  Psychiatric:        Behavior: Behavior normal.     ED Results / Procedures / Treatments   Labs (all labs ordered are listed, but only abnormal results are displayed) Labs Reviewed  COMPREHENSIVE METABOLIC PANEL - Abnormal; Notable for the following components:      Result Value   CO2 21 (*)    BUN 42 (*)    Creatinine, Ser 3.41 (*)    GFR calc non Af Amer 13 (*)    GFR calc Af Amer 15 (*)    All other components within normal limits  CBC - Abnormal; Notable for the following components:   RBC 3.25 (*)    Hemoglobin 9.7 (*)    HCT 30.1 (*)    All other components within normal limits  BASIC METABOLIC PANEL - Abnormal; Notable for the following components:   CO2 15 (*)    BUN 34 (*)    Creatinine, Ser 2.52 (*)    GFR calc non Af Amer 19 (*)  GFR calc Af Amer 22 (*)    All other components  within normal limits  CBC - Abnormal; Notable for the following components:   RBC 2.92 (*)    Hemoglobin 8.8 (*)    HCT 26.8 (*)    All other components within normal limits  SARS CORONAVIRUS 2 (TAT 6-24 HRS)  URINALYSIS, ROUTINE W REFLEX MICROSCOPIC  TYPE AND SCREEN    EKG EKG Interpretation  Date/Time:  Wednesday October 26 2019 10:38:57 EST Ventricular Rate:  69 PR Interval:  150 QRS Duration: 100 QT Interval:  464 QTC Calculation: 497 R Axis:   -30 Text Interpretation: Normal sinus rhythm Left axis deviation Prolonged QT Abnormal ECG Otherwise no significant change \ Confirmed by Melene PlanFloyd, Bray Vickerman [phone] on 10/26/2019 1:45:21 PM   Radiology CT Renal Stone Study  Result Date: 10/26/2019 CLINICAL DATA:  Flank pain, nausea EXAM: CT ABDOMEN AND PELVIS WITHOUT CONTRAST TECHNIQUE: Multidetector CT imaging of the abdomen and pelvis was performed following the standard protocol without IV contrast. COMPARISON:  09/30/2019 FINDINGS: Lower chest: Scarring and or atelectasis within the lung bases. No acute findings. Hepatobiliary: No focal liver abnormality is seen. No gallstones, gallbladder wall thickening, or biliary dilatation. Pancreas: Unremarkable. No pancreatic ductal dilatation or surrounding inflammatory changes. Spleen: Normal in size without focal abnormality. Adrenals/Urinary Tract: Adrenal glands are unremarkable. Kidneys are normal, without renal calculi, focal lesion, or hydronephrosis. Bladder is unremarkable. Stomach/Bowel: Redemonstration of circumferential wall thickening and pericolonic fat stranding involving the distal transverse colon near the splenic flexure (series 3, image 22), which is similar in appearance to the prior study. No evidence of perforation or abscess. There are scattered colonic diverticula although no focally inflamed diverticulum is appreciated at this segment. There is a large volume of stool throughout the colon. Stomach and small bowel appear within  normal limits. Appendix is within normal limits. No evidence of bowel obstruction. Vascular/Lymphatic: No significant vascular findings are present. No enlarged abdominal or pelvic lymph nodes. Reproductive: Uterus and bilateral adnexa are unremarkable. Other: No abdominal wall hernia or abnormality. No abdominopelvic ascites. Musculoskeletal: No acute or significant osseous findings. IMPRESSION: 1. Redemonstration of circumferential wall thickening and pericolonic fat stranding involving the distal transverse colon near the splenic flexure, which is similar in appearance to the prior study. No evidence of perforation or abscess. Findings may represent a focal colitis versus diverticulitis, although no focally inflamed diverticulum is seen at this site. 2. Large volume of stool throughout the colon suggesting chronic constipation. Electronically Signed   By: Duanne GuessNicholas  Plundo D.O.   On: 10/26/2019 16:22    Procedures Fecal disimpaction  Date/Time: 10/26/2019 4:40 PM Performed by: Melene PlanFloyd, Tula Schryver, DO Authorized by: Melene PlanFloyd, Kately Graffam, DO  Consent: Verbal consent obtained. Risks and benefits: risks, benefits and alternatives were discussed Consent given by: patient Patient understanding: patient states understanding of the procedure being performed Patient consent: the patient's understanding of the procedure matches consent given Imaging studies: imaging studies available Required items: required blood products, implants, devices, and special equipment available Patient identity confirmed: verbally with patient Time out: Immediately prior to procedure a "time out" was called to verify the correct patient, procedure, equipment, support staff and site/side marked as required. Local anesthesia used: no  Anesthesia: Local anesthesia used: no  Sedation: Patient sedated: no  Patient tolerance: patient tolerated the procedure well with no immediate complications    (including critical care  time)  Medications Ordered in ED Medications  iohexol (OMNIPAQUE) 350 MG/ML injection 100 mL (has no administration in  time range)  aspirin EC tablet 81 mg (81 mg Oral Given 10/27/19 0924)  hydroxychloroquine (PLAQUENIL) tablet 200 mg (200 mg Oral Given 10/27/19 0924)  megestrol (MEGACE) 400 MG/10ML suspension 400 mg (400 mg Oral Given 10/27/19 1030)  amLODipine (NORVASC) tablet 10 mg (10 mg Oral Given 10/27/19 0924)  cloNIDine (CATAPRES) tablet 0.3 mg (0.3 mg Oral Given 10/27/19 0924)  colestipol (COLESTID) tablet 1 g (1 g Oral Given 10/27/19 1030)  metoprolol tartrate (LOPRESSOR) tablet 100 mg (100 mg Oral Given 10/27/19 0924)  prazosin (MINIPRESS) capsule 1 mg (1 mg Oral Not Given 10/26/19 2356)  DULoxetine (CYMBALTA) DR capsule 60 mg (60 mg Oral Given 10/26/19 2235)  clopidogrel (PLAVIX) tablet 75 mg (75 mg Oral Given 10/27/19 0924)  tiZANidine (ZANAFLEX) tablet 4 mg (4 mg Oral Given 10/27/19 0924)  feeding supplement (ENSURE ENLIVE) (ENSURE ENLIVE) liquid 237 mL (237 mLs Oral Given 10/27/19 1259)  multivitamin with minerals tablet 1 tablet (1 tablet Oral Given 10/27/19 0924)  enoxaparin (LOVENOX) injection 30 mg (has no administration in time range)  sodium chloride flush (NS) 0.9 % injection 3 mL (3 mLs Intravenous Not Given 10/27/19 0925)  acetaminophen (TYLENOL) tablet 650 mg (has no administration in time range)    Or  acetaminophen (TYLENOL) suppository 650 mg (has no administration in time range)  docusate sodium (COLACE) capsule 100 mg (100 mg Oral Given 10/27/19 0924)  polyethylene glycol (MIRALAX / GLYCOLAX) packet 17 g (has no administration in time range)  lactated ringers infusion ( Intravenous New Bag/Given 10/27/19 0938)  mirabegron ER (MYRBETRIQ) tablet 25 mg (has no administration in time range)  doxepin (SINEQUAN) capsule 10 mg (has no administration in time range)  sodium chloride 0.9 % bolus 1,000 mL (0 mLs Intravenous Stopped 10/26/19 2006)    ED Course  I  have reviewed the triage vital signs and the nursing notes.  Pertinent labs & imaging results that were available during my care of the patient were reviewed by me and considered in my medical decision making (see chart for details).    MDM Rules/Calculators/A&P                      68 yo F with a chief complaints of hematuria and flank pain.  I wonder if the patient is actually describing hematuria as she was just in the hospital for blood in her stool.  Her hemoglobin today is similar to prior.  She clinically appears dehydrated we will give her a bolus of IV fluids.  Obtain metabolic panel.  Image with history of flank pain.  CT scan viewed by me with a large stool burden.  Disimpacted at bedside with some improvement of her symptoms.  Read without significant acute finding.  With acute kidney injury will discuss with medicine for admission.   CRITICAL CARE Performed by: Rae Roam   Total critical care time: 35 minutes  Critical care time was exclusive of separately billable procedures and treating other patients.  Critical care was necessary to treat or prevent imminent or life-threatening deterioration.  Critical care was time spent personally by me on the following activities: development of treatment plan with patient and/or surrogate as well as nursing, discussions with consultants, evaluation of patient's response to treatment, examination of patient, obtaining history from patient or surrogate, ordering and performing treatments and interventions, ordering and review of laboratory studies, ordering and review of radiographic studies, pulse oximetry and re-evaluation of patient's condition.  The patients results and plan were  reviewed and discussed.   Any x-rays performed were independently reviewed by myself.   Differential diagnosis were considered with the presenting HPI.  Medications  iohexol (OMNIPAQUE) 350 MG/ML injection 100 mL (has no administration in time  range)  aspirin EC tablet 81 mg (81 mg Oral Given 10/27/19 0924)  hydroxychloroquine (PLAQUENIL) tablet 200 mg (200 mg Oral Given 10/27/19 0924)  megestrol (MEGACE) 400 MG/10ML suspension 400 mg (400 mg Oral Given 10/27/19 1030)  amLODipine (NORVASC) tablet 10 mg (10 mg Oral Given 10/27/19 0924)  cloNIDine (CATAPRES) tablet 0.3 mg (0.3 mg Oral Given 10/27/19 0924)  colestipol (COLESTID) tablet 1 g (1 g Oral Given 10/27/19 1030)  metoprolol tartrate (LOPRESSOR) tablet 100 mg (100 mg Oral Given 10/27/19 0924)  prazosin (MINIPRESS) capsule 1 mg (1 mg Oral Not Given 10/26/19 2356)  DULoxetine (CYMBALTA) DR capsule 60 mg (60 mg Oral Given 10/26/19 2235)  clopidogrel (PLAVIX) tablet 75 mg (75 mg Oral Given 10/27/19 0924)  tiZANidine (ZANAFLEX) tablet 4 mg (4 mg Oral Given 10/27/19 0924)  feeding supplement (ENSURE ENLIVE) (ENSURE ENLIVE) liquid 237 mL (237 mLs Oral Given 10/27/19 1259)  multivitamin with minerals tablet 1 tablet (1 tablet Oral Given 10/27/19 0924)  enoxaparin (LOVENOX) injection 30 mg (has no administration in time range)  sodium chloride flush (NS) 0.9 % injection 3 mL (3 mLs Intravenous Not Given 10/27/19 0925)  acetaminophen (TYLENOL) tablet 650 mg (has no administration in time range)    Or  acetaminophen (TYLENOL) suppository 650 mg (has no administration in time range)  docusate sodium (COLACE) capsule 100 mg (100 mg Oral Given 10/27/19 0924)  polyethylene glycol (MIRALAX / GLYCOLAX) packet 17 g (has no administration in time range)  lactated ringers infusion ( Intravenous New Bag/Given 10/27/19 0938)  mirabegron ER (MYRBETRIQ) tablet 25 mg (has no administration in time range)  doxepin (SINEQUAN) capsule 10 mg (has no administration in time range)  sodium chloride 0.9 % bolus 1,000 mL (0 mLs Intravenous Stopped 10/26/19 2006)    Vitals:   10/26/19 1901 10/26/19 1945 10/26/19 2105 10/27/19 0529  BP: 128/87 122/80 (!) 142/91 117/73  Pulse: 67 62 67 66  Resp: Temp:  (!) 97.4 F (36.3 C) 98 F (36.7 C) 98.4 F (36.9 C)  TempSrc:  Oral Oral Oral  SpO2: 97% 97% 100% 100%  Weight:      Height:        Final diagnoses:  AKI (acute kidney injury) (HCC)    Admission/ observation were discussed with the admitting physician, patient and/or family and they are comfortable with the plan.   Final Clinical Impression(s) / ED Diagnoses Final diagnoses:  AKI (acute kidney injury) [facility])    Rx / DC Orders ED Discharge Orders    None       Melene Plan, DO 10/27/19 1500

## 2019-10-26 NOTE — ED Notes (Signed)
Dr. Tyrone Nine at the bedside placing ultrasound IV--

## 2019-10-26 NOTE — Progress Notes (Signed)
Spoke with Staff RN about IV start ,patient has no orders at this time for IV fluids, medications or NSL .

## 2019-10-27 ENCOUNTER — Encounter (HOSPITAL_COMMUNITY): Payer: Self-pay | Admitting: Internal Medicine

## 2019-10-27 DIAGNOSIS — E872 Acidosis: Secondary | ICD-10-CM | POA: Diagnosis present

## 2019-10-27 DIAGNOSIS — E860 Dehydration: Secondary | ICD-10-CM | POA: Diagnosis present

## 2019-10-27 DIAGNOSIS — Z8673 Personal history of transient ischemic attack (TIA), and cerebral infarction without residual deficits: Secondary | ICD-10-CM

## 2019-10-27 DIAGNOSIS — Z8249 Family history of ischemic heart disease and other diseases of the circulatory system: Secondary | ICD-10-CM | POA: Diagnosis not present

## 2019-10-27 DIAGNOSIS — R531 Weakness: Secondary | ICD-10-CM

## 2019-10-27 DIAGNOSIS — Z79899 Other long term (current) drug therapy: Secondary | ICD-10-CM

## 2019-10-27 DIAGNOSIS — R319 Hematuria, unspecified: Secondary | ICD-10-CM | POA: Diagnosis present

## 2019-10-27 DIAGNOSIS — Z7982 Long term (current) use of aspirin: Secondary | ICD-10-CM | POA: Diagnosis not present

## 2019-10-27 DIAGNOSIS — N189 Chronic kidney disease, unspecified: Secondary | ICD-10-CM | POA: Diagnosis not present

## 2019-10-27 DIAGNOSIS — Z20822 Contact with and (suspected) exposure to covid-19: Secondary | ICD-10-CM | POA: Diagnosis present

## 2019-10-27 DIAGNOSIS — M069 Rheumatoid arthritis, unspecified: Secondary | ICD-10-CM

## 2019-10-27 DIAGNOSIS — N179 Acute kidney failure, unspecified: Principal | ICD-10-CM

## 2019-10-27 DIAGNOSIS — N183 Chronic kidney disease, stage 3 unspecified: Secondary | ICD-10-CM | POA: Diagnosis present

## 2019-10-27 DIAGNOSIS — I129 Hypertensive chronic kidney disease with stage 1 through stage 4 chronic kidney disease, or unspecified chronic kidney disease: Secondary | ICD-10-CM | POA: Diagnosis present

## 2019-10-27 DIAGNOSIS — M1990 Unspecified osteoarthritis, unspecified site: Secondary | ICD-10-CM | POA: Diagnosis present

## 2019-10-27 DIAGNOSIS — Z833 Family history of diabetes mellitus: Secondary | ICD-10-CM | POA: Diagnosis not present

## 2019-10-27 LAB — CBC
HCT: 26.8 % — ABNORMAL LOW (ref 36.0–46.0)
Hemoglobin: 8.8 g/dL — ABNORMAL LOW (ref 12.0–15.0)
MCH: 30.1 pg (ref 26.0–34.0)
MCHC: 32.8 g/dL (ref 30.0–36.0)
MCV: 91.8 fL (ref 80.0–100.0)
Platelets: 207 10*3/uL (ref 150–400)
RBC: 2.92 MIL/uL — ABNORMAL LOW (ref 3.87–5.11)
RDW: 14.5 % (ref 11.5–15.5)
WBC: 6.4 10*3/uL (ref 4.0–10.5)
nRBC: 0 % (ref 0.0–0.2)

## 2019-10-27 LAB — BASIC METABOLIC PANEL
Anion gap: 14 (ref 5–15)
BUN: 34 mg/dL — ABNORMAL HIGH (ref 8–23)
CO2: 15 mmol/L — ABNORMAL LOW (ref 22–32)
Calcium: 9.1 mg/dL (ref 8.9–10.3)
Chloride: 110 mmol/L (ref 98–111)
Creatinine, Ser: 2.52 mg/dL — ABNORMAL HIGH (ref 0.44–1.00)
GFR calc Af Amer: 22 mL/min — ABNORMAL LOW (ref >60)
GFR calc non Af Amer: 19 mL/min — ABNORMAL LOW (ref >60)
Glucose, Bld: 81 mg/dL (ref 70–99)
Potassium: 3.5 mmol/L (ref 3.5–5.1)
Sodium: 139 mmol/L (ref 135–145)

## 2019-10-27 MED ORDER — DOXEPIN HCL 10 MG PO CAPS
10.0000 mg | ORAL_CAPSULE | Freq: Every day | ORAL | Status: DC
  Administered 2019-10-27: 10 mg via ORAL
  Filled 2019-10-27: qty 1

## 2019-10-27 MED ORDER — MIRABEGRON ER 25 MG PO TB24
25.0000 mg | ORAL_TABLET | Freq: Every day | ORAL | Status: DC
  Administered 2019-10-28: 25 mg via ORAL
  Filled 2019-10-27: qty 1

## 2019-10-27 MED ORDER — LACTATED RINGERS IV SOLN: INTRAVENOUS | Status: DC

## 2019-10-27 MED ORDER — SODIUM CHLORIDE 0.9 % IV SOLN: INTRAVENOUS | Status: DC

## 2019-10-27 NOTE — Plan of Care (Signed)
  Problem: Health Behavior/Discharge Planning: Goal: Ability to manage health-related needs will improve Outcome: Progressing   

## 2019-10-27 NOTE — Progress Notes (Signed)
Bladder scan showed 245 mL of urine.

## 2019-10-27 NOTE — Progress Notes (Signed)
Internal Medicine Attending:   I saw and examined the patient. I reviewed Dr Noni Saupe note and I agree with the resident's findings and plan as documented in the resident's note.  Renal function is improving with menstruation IV fluids however she developed non-anion gap metabolic acidosis on normal saline.  Therefore we will switch to lactated Ringer we will continue at a rate of 100 cc an hour overnight.  We suspect that she may have a component of dehydration her blood pressures are a bit soft we did check orthostatics however she is not orthostatically hypotensive but has already received IV fluids.  She would benefit from an additional night of care in the hospital.  We will need to recheck her renal function tomorrow and make sure that she is not having urinary retention with changes of her OAB medications.

## 2019-10-27 NOTE — Evaluation (Signed)
Physical Therapy Evaluation Patient Details Name: Michelle Graves MRN: [id number] DOB: 03-19-1951 Today's Date: 10/27/2019   History of Present Illness  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    Clinical Impression  Pt presented supine in bed with HOB elevated, awake and willing to participate in therapy session. Prior to admission, pt reported that she ambulated with use of a rollator and was independent with ADLs. Pt lives alone in a single level apartment with a elevator. At the time of evaluation, pt significantly limited secondary to weakness, fatigue and lethargy. Pt with difficulty maintaining upright sitting at EOB without UE support or external support from PT. Pt would greatly benefit from further therapy services at a SNF prior to returning home alone; however, pt adamantly refusing and wanting to return home. PT will continue to follow acutely to progress mobility as tolerated.     Follow Up Recommendations SNF;Other (comment)(pt is adamantly refusing SNF, will need HHPT)    Equipment Recommendations  None recommended by PT    Recommendations for Other Services       Precautions / Restrictions Precautions Precautions: Fall Restrictions Weight Bearing Restrictions: No      Mobility  Bed Mobility Overal bed mobility: Needs Assistance Bed Mobility: Supine to Sit;Sit to Supine     Supine to sit: Min assist Sit to supine: Min assist   General bed mobility comments: greatly increased time and effort required, HOB elevated, use of bed rails, assistance needed for trunk elevation and for bilateral LEs to return onto bed  Transfers Overall transfer level: Needs assistance Equipment used: Rolling walker (2 wheeled) Transfers: Sit to/from Stand Sit to Stand: Mod assist         General transfer  comment: deferred secondary to lethargy  Ambulation/Gait                Stairs            Wheelchair Mobility    Modified Rankin (Stroke Patients Only)       Balance Overall balance assessment: Needs assistance;History of Falls Sitting-balance support: Feet unsupported;Bilateral upper extremity supported;Single extremity supported Sitting balance-[name]: Poor     Standing balance support: During functional activity;Bilateral upper extremity supported Standing balance-[name]: Poor                               Pertinent Vitals/Pain Pain Assessment: No/denies pain Faces Pain Scale: Hurts little more Pain Location: lower back  Pain Descriptors / Indicators: Aching Pain Intervention(s): Limited activity within patient's tolerance;Monitored during session;Repositioned    Home Living Family/patient expects to be discharged to:: Private residence Living Arrangements: Alone Available Help at Discharge: Family;Available 24 hours/day Type of Home: House Home Access: Ramped entrance Entrance Stairs-Rails: None Entrance Stairs-Number of Steps: pt reports that her apartment building has a level entry and that takes elevator to her 5th floor apartment Home Layout: One level Home Equipment: [place] - 2 wheels;Walker - 4 wheels;Cane - quad;Shower seat Additional Comments: Patients daughter lives around the corner     Prior Function Level of Independence: Independent with assistive device(s)         Comments: reports that she uses a quad cane     Hand Dominance   Dominant Hand: Left  Extremity/Trunk Assessment   Upper Extremity Assessment Upper Extremity Assessment: Defer to OT evaluation;Generalized weakness    Lower Extremity Assessment Lower Extremity Assessment: Generalized weakness    Cervical / Trunk Assessment Cervical / Trunk Assessment: Normal  Communication   Communication: No difficulties  Cognition Arousal/Alertness:  Lethargic Behavior During Therapy: WFL for tasks assessed/performed Overall Cognitive Status: Impaired/Different from baseline Area of Impairment: Following commands;Safety/judgement;Problem solving                     Memory: Decreased short-term memory Following Commands: Follows one step commands inconsistently Safety/Judgement: Decreased awareness of deficits   Problem Solving: Slow processing;Decreased initiation;Difficulty sequencing;Requires verbal cues General Comments: pt not following commands to scoot to [facility] while seated at EOB and unable to recall unstructions given by therapist      General Comments      Exercises     Assessment/Plan    PT Assessment Patient needs continued PT services  PT Problem List Decreased strength;Decreased range of motion;Decreased activity tolerance;Decreased mobility;Decreased balance;Decreased coordination;Decreased knowledge of use of DME;Decreased safety awareness;Decreased knowledge of precautions       PT Treatment Interventions DME instruction;Gait training;Stair training;Functional mobility training;Therapeutic activities;Therapeutic exercise;Balance training;Neuromuscular re-education;Patient/family education    PT Goals (Current goals can be found in the Care Plan section)  Acute Rehab PT Goals Patient Stated Goal: to get better PT Goal Formulation: With patient Time For Goal Achievement: 11/10/19 Potential to Achieve Goals: Good    Frequency Min 3X/week   Barriers to discharge        Co-evaluation               AM-PAC PT "6 Clicks" Mobility  Outcome Measure Help needed turning from your back to your side while in a flat bed without using bedrails?: A Little Help needed moving from lying on your back to sitting on the side of a flat bed without using bedrails?: A Little Help needed moving to and from a bed to a chair (including a wheelchair)?: A Lot Help needed standing up from a chair using your arms  (e.g., wheelchair or bedside chair)?: A Lot Help needed to walk in hospital room?: A Lot Help needed climbing 3-5 steps with a railing? : Total 6 Click Score: 13    End of Session   Activity Tolerance: Patient limited by lethargy Patient left: in bed;with call bell/phone within reach;with family/visitor present Nurse Communication: Mobility status PT Visit Diagnosis: Other abnormalities of gait and mobility (R26.89);Muscle weakness (generalized) (M62.81)    Time: 9323-5573 PT Time Calculation (min) (ACUTE ONLY): 40 min   Charges:   PT Evaluation $PT Eval Moderate Complexity: 1 Mod PT Treatments $Therapeutic Activity: 23-37 mins        Arletta Bale, DPT  Acute Rehabilitation Services Pager [phone] Office [phone]    Alessandra Bevels Weslie Rasmus 10/27/2019, 5:28 PM

## 2019-10-27 NOTE — Evaluation (Addendum)
Occupational Therapy Evaluation Patient Details Name: Michelle Graves MRN: [id number] DOB: 11-02-1950 Today's Date: 10/27/2019    History of Present Illness Ms. 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   Clinical Impression   Pt with decline in function and safety with ADLs and ADL mobility with impaired strength, balance, endurance and cognition. Pt not following commands to scoot to [facility] while seated at EOB and unable to recall unstructions given by therapist. Pt lives at home aloe and reports that she was independent with ADLs/selfcare, home mgt and used a quad cane for mobility. Pt reports that she was in hospital about a month ago and had been receiving HH OT/PT. Pt currently requires min - min guard A with bed mobility, min guard A with UB ADLs/selfcare, max A with LB ADLs and toileting and mod A with sit - stand, SPTs. Pt would benefit from acute OT services to address impairments tom maximize level of function and safety. Orthostatic BP readings as follows: Supine 97/63, pulse 69 Sitting 94/69, pulse 73 Sit - stand 97/84, pulse 86 Standing (only 2 minutes, unable to stand x 3 minutes) 117/71, pulse 92    Follow Up Recommendations  SNF    Equipment Recommendations  None recommended by OT    Recommendations for Other Services       Precautions / Restrictions Precautions Precautions: Fall Restrictions Weight Bearing Restrictions: No      Mobility Bed Mobility Overal bed mobility: Needs Assistance Bed Mobility: Supine to Sit;Sit to Supine     Supine to sit: Min guard;HOB elevated Sit to supine: Min assist   General bed mobility comments: Patient moves slowly and with increased effort, used rails and required cues to scoot forward to get feet on the floor  Transfers Overall transfer level: Needs  assistance Equipment used: Rolling walker (2 wheeled) Transfers: Sit to/from Stand Sit to Stand: Mod assist         General transfer comment: cues for correct hand placement, safety. Pt unable to stand >2 minutes    Balance Overall balance assessment: Needs assistance;History of Falls Sitting-balance support: No upper extremity supported;Feet unsupported Sitting balance-[name]: Good     Standing balance support: During functional activity;Bilateral upper extremity supported Standing balance-[name]: Poor                             ADL either performed or assessed with clinical judgement   ADL Overall ADL's : Needs assistance/impaired Eating/Feeding: Independent;Sitting   Grooming: Wash/dry hands;Wash/dry face;Min guard;Sitting   Upper Body Bathing: Min guard;Sitting   Lower Body Bathing: Maximal assistance   Upper Body Dressing : Min guard;Sitting   Lower Body Dressing: Maximal assistance   Toilet Transfer: Moderate assistance;Stand-pivot;RW;BSC;Cueing for safety;Cueing for sequencing   Toileting- Clothing Manipulation and Hygiene: Maximal assistance       Functional mobility during ADLs: Moderate assistance;Rolling walker;Cueing for safety;Cueing for sequencing General ADL Comments: pt unable to stand > 2 minutes     Vision Baseline Vision/History: Wears glasses Wears Glasses: At all times Patient Visual Report: No change from baseline       Perception     Praxis      Pertinent Vitals/Pain Pain Assessment: Faces Faces Pain Scale: Hurts little more Pain Location: lower back  Pain Descriptors /  Indicators: Aching Pain Intervention(s): Limited activity within patient's tolerance;Monitored during session;Repositioned     Hand Dominance Left   Extremity/Trunk Assessment Upper Extremity Assessment Upper Extremity Assessment: Generalized weakness   Lower Extremity Assessment Lower Extremity Assessment: Defer to PT evaluation    Cervical / Trunk Assessment Cervical / Trunk Assessment: Normal   Communication Communication Communication: No difficulties   Cognition Arousal/Alertness: Awake/alert Behavior During Therapy: WFL for tasks assessed/performed Overall Cognitive Status: Impaired/Different from baseline Area of Impairment: Attention;Memory;Following commands                     Memory: Decreased short-term memory Following Commands: Follows one step commands inconsistently       General Comments: pt not following commands to scoot to [facility] while seated at EOB and unable to recall unstructions given by therapist   General Comments       Exercises     Shoulder Instructions      Home Living Family/patient expects to be discharged to:: Private residence Living Arrangements: Alone Available Help at Discharge: Family;Available 24 hours/day Type of Home: House Home Access: Ramped entrance Entrance Stairs-Number of Steps: pt reports that her apartment building has a level entry and that takes elevator to her 5th floor apartment Entrance Stairs-Rails: None Home Layout: One level     Bathroom Shower/Tub: Chief Strategy Officer: Standard     Home Equipment: Environmental consultant - 2 wheels;Walker - 4 wheels;Cane - quad;Shower seat   Additional Comments: Patients daughter lives around the corner       Prior Functioning/Environment Level of Independence: Independent with assistive device(s)        Comments: reports that she uses a quad cane        OT Problem List: Decreased strength;Decreased activity tolerance;Impaired balance (sitting and/or standing);Decreased safety awareness;Decreased knowledge of use of DME or AE;Pain;Decreased cognition      OT Treatment/Interventions: Self-care/ADL training;Therapeutic exercise;DME and/or AE instruction;Therapeutic activities;Patient/family education;Balance training    OT Goals(Current goals can be found in the care plan section) Acute Rehab  OT Goals Patient Stated Goal: to get better OT Goal Formulation: With patient Time For Goal Achievement: 11/10/19 Potential to Achieve Goals: Good ADL Goals Pt Will Perform Grooming: with supervision;with set-up;sitting Pt Will Perform Upper Body Bathing: with supervision;with set-up;sitting Pt Will Perform Lower Body Bathing: with mod assist;sitting/lateral leans;sit to/from stand Pt Will Perform Upper Body Dressing: with supervision;with set-up;sitting Pt Will Transfer to Toilet: with min assist;ambulating;stand pivot transfer;bedside commode Pt Will Perform Toileting - Clothing Manipulation and hygiene: with mod assist;sit to/from stand Additional ADL Goal #1: Pt will complete bed mobility with sup to sit EOB for ADLs/selfcare  OT Frequency: Min 2X/week   Barriers to D/C: Decreased caregiver support          Co-evaluation              AM-PAC OT "6 Clicks" Daily Activity     Outcome Measure Help from another person eating meals?: None Help from another person taking care of personal grooming?: A Little Help from another person toileting, which includes using toliet, bedpan, or urinal?: A Lot Help from another person bathing (including washing, rinsing, drying)?: A Lot Help from another person to put on and taking off regular upper body clothing?: A Little Help from another person to put on and taking off regular lower body clothing?: Total 6 Click Score: 15   End of Session Equipment Utilized During Treatment: Rolling walker;Gait belt  Activity Tolerance: Patient limited by  fatigue;Patient limited by pain Patient left: in bed;with call bell/phone within reach;with bed alarm set;with nursing/sitter in room  OT Visit Diagnosis: Unsteadiness on feet (R26.81);Other abnormalities of gait and mobility (R26.89);Muscle weakness (generalized) (M62.81);History of falling (Z91.81);Pain;Other symptoms and signs involving cognitive function Pain - Right/Left: (back) Pain - part of  body: (back)                Time: 4098-1191 OT Time Calculation (min): 25 min Charges:  OT General Charges $OT Visit: 1 Visit OT Evaluation $OT Eval Moderate Complexity: 1 Mod OT Treatments $Self Care/Home Management : 8-22 mins    Galen Manila 10/27/2019, 1:41 PM

## 2019-10-27 NOTE — Progress Notes (Addendum)
   Subjective:   Michelle Graves states she is feeling a bit less weak than yesterday but still doesn't feel great because her sleep continues to be interrupted. No other acute complaints at this time.   Objective:  Vital signs in last 24 hours: Vitals:   10/26/19 1901 10/26/19 1945 10/26/19 2105 10/27/19 0529  BP: 128/87 122/80 (!) 142/91 117/73  Pulse: 67 62 67 66  Resp: 16 16 16    Temp:  (!) 97.4 F (36.3 C) 98 F (36.7 C) 98.4 F (36.9 C)  TempSrc:  Oral Oral Oral  SpO2: 97% 97% 100% 100%  Weight:      Height:        Physical Exam Constitutional:      General: She is not in acute distress.    Appearance: Normal appearance.  Cardiovascular:     Rate and Rhythm: Normal rate and regular rhythm.     Heart sounds: No murmur.  Pulmonary:     Effort: Pulmonary effort is normal. No respiratory distress.  Abdominal:     General: Bowel sounds are normal. There is no distension.     Palpations: Abdomen is soft.     Tenderness: There is no abdominal tenderness.  Musculoskeletal:     Right lower leg: No edema.     Left lower leg: No edema.  Skin:    General: Skin is warm and dry.  Neurological:     General: No focal deficit present.     Mental Status: She is alert and oriented to person, place, and time. Mental status is at baseline.  Psychiatric:        Mood and Affect: Mood normal.        Behavior: Behavior normal.    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   # Acute on Chronic Kidney Failure: Baseline ~ 1.9. BUN/Cr ratio of 12 on admission. Creatinine improved some with fluids, from 3.41 to 2.52. Per chart review and discussion with patient, she recently saw her PCP and had some medication changes. The note is available and dated  12/22. Her oxybutynin was discontinued due to lack of efficacy and she was started on Myrbetriq, which rarely causes urinary retention. However, her Doxepin does was increased from 10 mg to 25 mg. Given its antimuscarinic effects, likely caused a urinary retention and acute AKI. Mild dehydration could have contributed as well, however patient reports no decrease in fluid intake.   Switched NS IVF to LR due to development of NAGMA.   Not back to baseline creatinine and will require additional monitoring with continued IVF fluids.   - BMP - IVF maintenance with LR @ 100 cc/hr  - Bladder scan - Decreased dose of Doxepin back to 10 mg  - Discontinue Oxybutynin - Restart Myrbetriq 25 mg daily   # Generalized weakness # Deconditioning  This has been ongoing problem and previously felt to be due to deconditioning. Recent worsening may be secondary to AKI. She has been receiving PT services at home.   - PT/OT evaluation  Dispo: Anticipated discharge in 1-2 days   Dr. Jose Persia Internal Medicine PGY-1  Pager: [phone] 10/27/2019, 7:35 AM

## 2019-10-28 DIAGNOSIS — R339 Retention of urine, unspecified: Secondary | ICD-10-CM

## 2019-10-28 DIAGNOSIS — N390 Urinary tract infection, site not specified: Secondary | ICD-10-CM

## 2019-10-28 HISTORY — DX: Urinary tract infection, site not specified: N39.0

## 2019-10-28 LAB — BASIC METABOLIC PANEL
Anion gap: 8 (ref 5–15)
BUN: 24 mg/dL — ABNORMAL HIGH (ref 8–23)
CO2: 23 mmol/L (ref 22–32)
Calcium: 9.6 mg/dL (ref 8.9–10.3)
Chloride: 108 mmol/L (ref 98–111)
Creatinine, Ser: 1.81 mg/dL — ABNORMAL HIGH (ref 0.44–1.00)
GFR calc Af Amer: 33 mL/min — ABNORMAL LOW (ref >60)
GFR calc non Af Amer: 29 mL/min — ABNORMAL LOW (ref >60)
Glucose, Bld: 114 mg/dL — ABNORMAL HIGH (ref 70–99)
Potassium: 3.7 mmol/L (ref 3.5–5.1)
Sodium: 139 mmol/L (ref 135–145)

## 2019-10-28 MED ORDER — DOXEPIN HCL 10 MG PO CAPS: 10.0000 mg | ORAL_CAPSULE | Freq: Every day | ORAL | 0 refills | Status: DC

## 2019-10-28 NOTE — TOC Initial Note (Signed)
Transition of Care  [facility]) - Initial/Assessment Note    Patient Details  Name: Michelle Graves MRN: [id number] Date of Birth: 1951/06/23  Transition of Care [facility]) CM/SW Contact:    Alexander Mt, [place] Phone Number: 10/28/2019, 10:11 AM  Clinical Narrative:                 CSW spoke with pt at bedside. Introduced self, role, reason for visit. Pt eager to get home and back in her own environment. Confirmed address and PCP. Pt lives alone but her children live on either side of her and provide her assistance/supervision as needed. Pt has been active with [facility] PT/OT/SW. Requested new orders and referral given to [facility]. Pt has all needed DME. Her daughter will pick her up for discharge. Pt amenable to making her own PCP appointment since the office is likely closed due to the holiday.    Expected Discharge Plan: [place] Barriers to Discharge: Barriers Resolved   Patient Goals and CMS Choice Patient states their goals for this hospitalization and ongoing recovery are:: to get home and shower and get in my own bed CMS Medicare.gov Compare Post Acute Care list provided to:: Patient Choice offered to / list presented to : Patient  Expected Discharge Plan and Services Expected Discharge Plan: [place] In-house Referral: Clinical Social Work Discharge Planning Services: CM Consult Post Acute Care Choice: Resumption of Svcs/PTA Provider Living arrangements for the past 2 months: Apartment Expected Discharge Date: 10/28/19               HH Arranged: PT, OT HH Agency: [facility] Date [facility] Agency Contacted: 10/28/19 Time HH Agency Contacted: 53 Representative spoke with at [place]: Adela Lank  Prior Living Arrangements/Services Living arrangements for the past 2 months: [place] with:: Self Patient language and need for interpreter reviewed:: Yes(no needs) Do you feel safe going back to the place where you live?: Yes       Need for Family Participation in Patient Care: Yes (Comment)(assistance and supervision as needed) Care giver support system in place?: Yes (comment)(pt daughter and son live on either side of her) Current home services: DME, Home OT, Home PT Criminal Activity/Legal Involvement Pertinent to Current Situation/Hospitalization: No - Comment as needed  Activities of Daily Living Home Assistive Devices/Equipment: Cane (specify quad or straight) ADL Screening (condition at time of admission) Patient's cognitive ability adequate to safely complete daily activities?: Yes Is the patient deaf or have difficulty hearing?: No Does the patient have difficulty seeing, even when wearing glasses/contacts?: No Does the patient have difficulty concentrating, remembering, or making decisions?: No Patient able to express need for assistance with ADLs?: Yes Does the patient have difficulty dressing or bathing?: No Independently performs ADLs?: Yes (appropriate for developmental age) Does the patient have difficulty walking or climbing stairs?: Yes Weakness of Legs: Both Weakness of Arms/Hands: None  Permission Sought/Granted Permission sought to share information with : Other (comment) Permission granted to share information with : Yes, Verbal Permission Granted  Permission granted to share info w AGENCY: [facility]   Emotional Assessment Appearance:: Appears stated age Attitude/Demeanor/Rapport: Gracious, Engaged Affect (typically observed): Accepting, Adaptable, Appropriate Orientation: : Oriented to Self, Oriented to Place, Oriented to  Time, Oriented to Situation Alcohol / Substance Use: Not Applicable Psych Involvement: No (comment)  Admission diagnosis:  Acute on chronic renal failure (HCC) [N17.9, N18.9] Acute on chronic kidney failure ([place]) [N17.9, N18.9] Patient Active  Problem List   Diagnosis Date Noted  . Urinary retention 10/28/2019  . Acute on chronic kidney failure (HCC)  10/27/2019  . Malnutrition of moderate degree 10/03/2019  . Colitis 09/30/2019  . AKI (acute kidney injury) (HCC) 05/28/2019  . Nausea & vomiting 05/28/2019  . 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 Anemia, chronic disease 05/28/2019  . Swelling   . Small vessel disease, cerebrovascular   . Acute ischemic stroke (HCC)   . General weakness   . Acute left ankle pain   . Acute on chronic renal failure (HCC) 05/13/2019  . Weakness of both lower extremities 05/13/2019  . Cerebral thrombosis with cerebral infarction 03/31/2019  . New onset a-fib (HCC) 03/27/2019  . Acute kidney injury superimposed on CKD (HCC) 03/24/2019  . Hypotension 03/24/2019  . Dark emesis 03/24/2019  . Dark stools 03/24/2019  . Diverticulitis of intestine without perforation or abscess without bleeding   . Sinus tachycardia 08/27/2016  . Diverticulitis 08/26/2016  . HYPERTENSION, MALIGNANT ESSENTIAL 06/24/2007  . KIDNEY DISEASE, CHRONIC, STAGE III 06/24/2007  . ARTHRITIS, RHEUMATOID, SEROPOSITIVE 06/24/2007   PCP:  [facility]:   [id number] - [name], [place] - [address] AT [facility] OF [address] [place] [address] Phone: [phone] Fax: [phone]   Readmission Risk Interventions Readmission Risk Prevention Plan 10/28/2019 10/05/2019 05/31/2019  Transportation Screening Complete Complete Complete  PCP or Specialist Appt within 3-5 Days - Not Complete Not Complete  Not Complete comments - - plan for SNF  HRI or Home Care Consult - Complete Not Complete  HRI or Home Care Consult comments - - plan for SNF  Social Work Consult for Recovery Care Planning/Counseling - Complete Complete  Palliative Care Screening - Not Applicable Not Applicable  Medication Review (RN Care Manager) Referral to Pharmacy Complete Complete  PCP or Specialist appointment within 3-5 days of discharge Not Complete - -  PCP/Specialist Appt  Not Complete comments due to holiday, pt to make appointment - -  HRI or Home Care Consult Complete - -  SW Recovery Care/Counseling Consult Complete - -  Palliative Care Screening Not Applicable - -  Skilled Nursing Facility Complete - -  Some recent data might be hidden

## 2019-10-28 NOTE — Plan of Care (Signed)

## 2019-10-28 NOTE — Progress Notes (Signed)
Jerlisa J Laymon to be D/C'd  per MD order. Discussed with the patient and all questions fully answered.  VSS, Skin clean, dry and intact without evidence of skin break down, no evidence of skin tears noted.  IV catheter discontinued intact. Site without signs and symptoms of complications. Dressing and pressure applied.  An After Visit Summary was printed and given to the patient. Patient received prescription.  D/c education completed with patient/family including follow up instructions, medication list, d/c activities limitations if indicated, with other d/c instructions as indicated by MD - patient able to verbalize understanding, all questions fully answered.   Patient instructed to return to ED, call 911, or call MD for any changes in condition.   Patient to be escorted via WC, and D/C home via private auto. 

## 2019-10-28 NOTE — Progress Notes (Signed)
   Subjective:   Patient is upset this AM. She has had delays in getting the help she needs. She otherwise is feeling fine. She is eating, drinking, and ambulating. She is ready to go home. We discussed that she will need follow-up as an outpatient and that changes we have made to her medications. Her urinary urgency is causing significant sleep and life disturbances; however, she does understand why we have changed her medications. She voices understanding.   Objective:  Vital signs in last 24 hours: Vitals:   10/27/19 0529 10/27/19 1500 10/27/19 2028 10/28/19 0543  BP: 117/73 101/63 107/67 125/84  Pulse: 66 64 63 64  Resp:  17    Temp: 98.4 F (36.9 C) 98.1 F (36.7 C) 98.3 F (36.8 C) 98.6 F (37 C)  TempSrc: Oral Oral Oral Oral  SpO2: 100% 100% 100% 99%  Weight:      Height:        Physical Exam Constitutional:      General: She is not in acute distress.    Appearance: Normal appearance.  Cardiovascular:     Rate and Rhythm: Normal rate and regular rhythm.     Heart sounds: No murmur.  Pulmonary:     Effort: Pulmonary effort is normal. No respiratory distress.  Abdominal:     General: Bowel sounds are normal. There is no distension.     Palpations: Abdomen is soft.     Tenderness: There is no abdominal tenderness.  Musculoskeletal:     Right lower leg: No edema.     Left lower leg: No edema.  Skin:    General: Skin is warm and dry.  Neurological:     General: No focal deficit present.     Mental Status: She is alert and oriented to person, place, and time. Mental status is at baseline.  Psychiatric:        Mood and Affect: Mood normal.        Behavior: Behavior normal.    Assessment/Plan:  Active Problems:   Acute on chronic renal failure (HCC)   Acute on chronic kidney failure [facility])  Ms. 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   # Acute on Chronic Kidney Failure: Baseline ~ 1.9. BUN/Cr ratio of 12 on admission. Creatinine improved some with fluids, from 3.41 to 1.81 with IVF. Her AKI was likely secondary to pre-renal etiology and urinary retention. We discussed the medication changes listed below. She will need close outpatient follow-up.    - Discontinue IVF - Continue Doxepin 10 mg  - Hold Oxybutynin on discharge - Continue Myrbetriq 25 mg daily   # Generalized weakness # Deconditioning  This has been ongoing problem and previously felt to be due to deconditioning. Recent worsening may be secondary to AKI. She has been receiving PT services at home.   - PT/OT recommending SNF but patient has declined. Will be discharged with [facility] orders.   Dispo: Anticipated discharge today.   Dr. Verdene Lennert Internal Medicine PGY-1  Pager: [phone] 10/28/2019, 7:40 AM

## 2019-10-28 NOTE — Discharge Summary (Signed)
Name: Michelle Graves MRN: [id number] DOB: [birth year] 69 y.o. PCP: [facility]  Date of Admission: 10/26/2019 10:34 AM Date of Discharge:  Attending Physician: Lucious Groves, DO  Discharge Diagnosis: 1. Acute Kidney Injury  2. Physical Deconditioning  Discharge Medications: Allergies as of 10/28/2019   No Known Allergies     Medication List    STOP taking these medications   oxybutynin 10 MG 24 hr tablet Commonly known as: DITROPAN-XL     TAKE these medications   amLODipine 10 MG tablet Commonly known as: NORVASC Take 10 mg by mouth daily.   aspirin 81 MG EC tablet Take 1 tablet (81 mg total) by mouth daily.   cloNIDine 0.3 MG tablet Commonly known as: CATAPRES Take 0.3 mg by mouth 2 (two) times daily.   clopidogrel 75 MG tablet Commonly known as: PLAVIX Take 1 tablet (75 mg total) by mouth daily.   colestipol 1 g tablet Commonly known as: COLESTID Take 1 tablet (1 g total) by mouth 2 (two) times daily.   docusate sodium 100 MG capsule Commonly known as: COLACE Take 1 capsule (100 mg total) by mouth 2 (two) times daily.   doxepin 10 MG capsule Commonly known as: SINEQUAN Take 1 capsule (10 mg total) by mouth at bedtime. What changed:   medication strength  how much to take   DULoxetine 60 MG capsule Commonly known as: CYMBALTA Take 60 mg by mouth at bedtime.   feeding supplement (ENSURE ENLIVE) Liqd Take 237 mLs by mouth 3 (three) times daily between meals.   furosemide 20 MG tablet Commonly known as: LASIX Take 20 mg by mouth daily.   hydrochlorothiazide 25 MG tablet Commonly known as: HYDRODIURIL Take 25 mg by mouth daily.   HYDROcodone-acetaminophen 5-325 MG tablet Commonly known as: NORCO/VICODIN Take 1 tablet by mouth 3 (three) times daily as needed.   hydroxychloroquine 200 MG tablet Commonly known as: PLAQUENIL Take 200 mg by mouth 2 (two) times daily.   lisinopril 40 MG tablet Commonly known  as: ZESTRIL Take 40 mg by mouth daily.   megestrol 400 MG/10ML suspension Commonly known as: MEGACE Take 10 mLs (400 mg total) by mouth 2 (two) times daily.   metoprolol tartrate 100 MG tablet Commonly known as: LOPRESSOR Take 100 mg by mouth daily.   multivitamin with minerals Tabs tablet Take 1 tablet by mouth daily.   Myrbetriq 25 MG Tb24 tablet Generic drug: mirabegron ER Take 25 mg by mouth daily.   omeprazole 20 MG capsule Commonly known as: PRILOSEC Take 20 mg by mouth as needed (acid reflux).   ondansetron 4 MG tablet Commonly known as: ZOFRAN Take 1 tablet (4 mg total) by mouth every 6 (six) hours as needed for nausea. What changed: reasons to take this   pantoprazole 40 MG tablet Commonly known as: PROTONIX Take 1 tablet (40 mg total) by mouth 2 (two) times daily.   prazosin 1 MG capsule Commonly known as: MINIPRESS Take 1 mg by mouth at bedtime.   tiZANidine 4 MG tablet Commonly known as: ZANAFLEX Take 4 mg by mouth 2 (two) times daily.       Disposition and follow-up:   Ms.Baljit JAYLEI FUERTE was discharged from [facility] in Good condition.  At the hospital follow up visit please address:  1.  Medications that may led to urinary retention, specifically Doxepin.   2.  Labs / imaging needed at time of follow-up: BMP  3.  Pending  labs/ test needing follow-up: None   Follow-up Appointments: Follow-up Information    [facility]. Schedule an appointment as soon as possible for a visit in 1 week(s).   Specialty: Family Medicine Contact information: [address] [place] [address] [phone]           Hospital Course by problem list: 1. Acute Kidney Injury: Patient presented with increased weakness and labs indicated an AKI with creatinine of 3.41. Creatinine improved some with fluids, from 3.41 to 2.52. Per chart review and discussion with patient, she recently saw her PCP  and had some medication changes. Doxepin does was increased from 10 mg to 25 mg. Given its antimuscarinic effects, likely caused urinary retention.  2. Physical Deconditioning:  Chronic problem being addressed with home PT.   Discharge Vitals:   BP 125/84 (BP Location: Right Arm)   Pulse 64   Temp 98.6 F (37 C) (Oral)   Resp 17   Ht 5\' 7"  (1.702 m)   Wt 80.8 kg   SpO2 99%   BMI 27.90 kg/m   Pertinent Labs, Studies, and Procedures:  BMP Latest Ref Rng & Units 10/28/2019 10/27/2019 10/26/2019  Glucose 70 - 99 mg/dL 10/28/2019) 81 98  BUN 8 - 23 mg/dL 254(D) 82(M) 41(R)  Creatinine 0.44 - 1.00 mg/dL 83(E) 9.40(H) 6.80(S)  Sodium 135 - 145 mmol/L 139 139 139  Potassium 3.5 - 5.1 mmol/L 3.7 3.5 3.7  Chloride 98 - 111 mmol/L 108 110 104  CO2 22 - 32 mmol/L 23 15(L) 21(L)  Calcium 8.9 - 10.3 mg/dL 9.6 9.1 8.11(S     Discharge Instructions: Discharge Instructions    Call MD for:  difficulty breathing, headache or visual disturbances   Complete by: As directed    Call MD for:  extreme fatigue   Complete by: As directed    Call MD for:  persistant dizziness or light-headedness   Complete by: As directed    Call MD for:  persistant nausea and vomiting   Complete by: As directed    Diet - low sodium heart healthy   Complete by: As directed    Discharge instructions   Complete by: As directed    Thank you for allowing 31.[facility] to care for you during your stay.  You were admitted to the hospital due to an acute kidney injury. This was likely due to urinary retention. This can be a side effect of the medication called Doxepin, which recently had a dose increase. Please switch back to the lower dose of 10 mg, a prescription has been sent to your pharmacy.   Increase activity slowly   Complete by: As directed       Signed: Dr. [place] Internal Medicine PGY-1  Pager: [phone] 10/28/2019, 9:34 AM

## 2019-11-07 ENCOUNTER — Inpatient Hospital Stay (HOSPITAL_COMMUNITY)
Admission: EM | Admit: 2019-11-07 | Discharge: 2019-11-09 | DRG: 683 | Disposition: A | Discharge status: Home Health | Payer: Medicare Other | Attending: Internal Medicine | Admitting: Internal Medicine

## 2019-11-07 ENCOUNTER — Emergency Department (HOSPITAL_COMMUNITY): Payer: Medicare Other

## 2019-11-07 DIAGNOSIS — Z7902 Long term (current) use of antithrombotics/antiplatelets: Secondary | ICD-10-CM

## 2019-11-07 DIAGNOSIS — I130 Hypertensive heart and chronic kidney disease with heart failure and stage 1 through stage 4 chronic kidney disease, or unspecified chronic kidney disease: Secondary | ICD-10-CM | POA: Diagnosis present

## 2019-11-07 DIAGNOSIS — N189 Chronic kidney disease, unspecified: Secondary | ICD-10-CM | POA: Diagnosis present

## 2019-11-07 DIAGNOSIS — K219 Gastro-esophageal reflux disease without esophagitis: Secondary | ICD-10-CM | POA: Diagnosis present

## 2019-11-07 DIAGNOSIS — E860 Dehydration: Secondary | ICD-10-CM | POA: Diagnosis present

## 2019-11-07 DIAGNOSIS — I959 Hypotension, unspecified: Secondary | ICD-10-CM | POA: Diagnosis present

## 2019-11-07 DIAGNOSIS — N179 Acute kidney failure, unspecified: Principal | ICD-10-CM | POA: Diagnosis present

## 2019-11-07 DIAGNOSIS — N390 Urinary tract infection, site not specified: Secondary | ICD-10-CM | POA: Diagnosis present

## 2019-11-07 DIAGNOSIS — R531 Weakness: Secondary | ICD-10-CM

## 2019-11-07 DIAGNOSIS — M1990 Unspecified osteoarthritis, unspecified site: Secondary | ICD-10-CM | POA: Diagnosis present

## 2019-11-07 DIAGNOSIS — I951 Orthostatic hypotension: Secondary | ICD-10-CM | POA: Diagnosis present

## 2019-11-07 DIAGNOSIS — D638 Anemia in other chronic diseases classified elsewhere: Secondary | ICD-10-CM | POA: Diagnosis present

## 2019-11-07 DIAGNOSIS — G4700 Insomnia, unspecified: Secondary | ICD-10-CM | POA: Diagnosis present

## 2019-11-07 DIAGNOSIS — F331 Major depressive disorder, recurrent, moderate: Secondary | ICD-10-CM | POA: Diagnosis present

## 2019-11-07 DIAGNOSIS — E46 Unspecified protein-calorie malnutrition: Secondary | ICD-10-CM | POA: Diagnosis present

## 2019-11-07 DIAGNOSIS — Z20822 Contact with and (suspected) exposure to covid-19: Secondary | ICD-10-CM | POA: Diagnosis present

## 2019-11-07 DIAGNOSIS — Z8249 Family history of ischemic heart disease and other diseases of the circulatory system: Secondary | ICD-10-CM

## 2019-11-07 DIAGNOSIS — M069 Rheumatoid arthritis, unspecified: Secondary | ICD-10-CM | POA: Diagnosis present

## 2019-11-07 DIAGNOSIS — Z7982 Long term (current) use of aspirin: Secondary | ICD-10-CM

## 2019-11-07 DIAGNOSIS — Z833 Family history of diabetes mellitus: Secondary | ICD-10-CM

## 2019-11-07 DIAGNOSIS — D631 Anemia in chronic kidney disease: Secondary | ICD-10-CM | POA: Diagnosis present

## 2019-11-07 DIAGNOSIS — I69334 Monoplegia of upper limb following cerebral infarction affecting left non-dominant side: Secondary | ICD-10-CM

## 2019-11-07 DIAGNOSIS — Z79899 Other long term (current) drug therapy: Secondary | ICD-10-CM

## 2019-11-07 DIAGNOSIS — I5032 Chronic diastolic (congestive) heart failure: Secondary | ICD-10-CM | POA: Diagnosis present

## 2019-11-07 DIAGNOSIS — N183 Chronic kidney disease, stage 3 unspecified: Secondary | ICD-10-CM | POA: Diagnosis present

## 2019-11-07 HISTORY — DX: Acute kidney failure, unspecified: N17.9

## 2019-11-07 HISTORY — DX: Anemia, unspecified: D64.9

## 2019-11-07 HISTORY — DX: Chronic kidney disease, unspecified: N18.9

## 2019-11-07 LAB — CBC WITH DIFFERENTIAL/PLATELET
Abs Immature Granulocytes: 0.03 10*3/uL (ref 0.00–0.07)
Basophils Absolute: 0 10*3/uL (ref 0.0–0.1)
Basophils Relative: 1 %
Eosinophils Absolute: 0 10*3/uL (ref 0.0–0.5)
Eosinophils Relative: 0 %
HCT: 35.6 % — ABNORMAL LOW (ref 36.0–46.0)
Hemoglobin: 11.3 g/dL — ABNORMAL LOW (ref 12.0–15.0)
Immature Granulocytes: 1 %
Lymphocytes Relative: 32 %
Lymphs Abs: 1.7 10*3/uL (ref 0.7–4.0)
MCH: 29.3 pg (ref 26.0–34.0)
MCHC: 31.7 g/dL (ref 30.0–36.0)
MCV: 92.2 fL (ref 80.0–100.0)
Monocytes Absolute: 0.5 10*3/uL (ref 0.1–1.0)
Monocytes Relative: 9 %
Neutro Abs: 3.1 10*3/uL (ref 1.7–7.7)
Neutrophils Relative %: 57 %
Platelets: 222 10*3/uL (ref 150–400)
RBC: 3.86 MIL/uL — ABNORMAL LOW (ref 3.87–5.11)
RDW: 15 % (ref 11.5–15.5)
WBC: 5.4 10*3/uL (ref 4.0–10.5)
nRBC: 0 % (ref 0.0–0.2)

## 2019-11-07 LAB — URINALYSIS, ROUTINE W REFLEX MICROSCOPIC
Bilirubin Urine: NEGATIVE
Glucose, UA: NEGATIVE mg/dL
Hgb urine dipstick: NEGATIVE
Ketones, ur: NEGATIVE mg/dL
Nitrite: NEGATIVE
Protein, ur: 30 mg/dL — AB
Specific Gravity, Urine: 1.015 (ref 1.005–1.030)
pH: 6 (ref 5.0–8.0)

## 2019-11-07 LAB — TYPE AND SCREEN
ABO/RH(D): O POS
Antibody Screen: NEGATIVE

## 2019-11-07 LAB — COMPREHENSIVE METABOLIC PANEL
ALT: 11 U/L (ref 0–44)
AST: 17 U/L (ref 15–41)
Albumin: 3.5 g/dL (ref 3.5–5.0)
Alkaline Phosphatase: 52 U/L (ref 38–126)
Anion gap: 10 (ref 5–15)
BUN: 25 mg/dL — ABNORMAL HIGH (ref 8–23)
CO2: 22 mmol/L (ref 22–32)
Calcium: 9.8 mg/dL (ref 8.9–10.3)
Chloride: 104 mmol/L (ref 98–111)
Creatinine, Ser: 2.64 mg/dL — ABNORMAL HIGH (ref 0.44–1.00)
GFR calc Af Amer: 21 mL/min — ABNORMAL LOW (ref >60)
GFR calc non Af Amer: 18 mL/min — ABNORMAL LOW (ref >60)
Glucose, Bld: 98 mg/dL (ref 70–99)
Potassium: 3.7 mmol/L (ref 3.5–5.1)
Sodium: 136 mmol/L (ref 135–145)
Total Bilirubin: 0.6 mg/dL (ref 0.3–1.2)
Total Protein: 6.8 g/dL (ref 6.5–8.1)

## 2019-11-07 LAB — SARS CORONAVIRUS 2 (TAT 6-24 HRS): SARS Coronavirus 2: NEGATIVE

## 2019-11-07 LAB — LIPASE, BLOOD: Lipase: 20 U/L (ref 11–51)

## 2019-11-07 MED ORDER — ALUM & MAG HYDROXIDE-SIMETH 200-200-20 MG/5ML PO SUSP
30.0000 mL | Freq: Once | ORAL | Status: AC
  Administered 2019-11-07: 30 mL via ORAL
  Filled 2019-11-07: qty 30

## 2019-11-07 MED ORDER — SODIUM CHLORIDE 0.9 % IV BOLUS
500.0000 mL | Freq: Once | INTRAVENOUS | Status: AC
  Administered 2019-11-07: 500 mL via INTRAVENOUS

## 2019-11-07 MED ORDER — SODIUM CHLORIDE 0.9 % IV SOLN
1.0000 g | Freq: Once | INTRAVENOUS | Status: AC
  Administered 2019-11-07: 1 g via INTRAVENOUS
  Filled 2019-11-07: qty 10

## 2019-11-07 MED ORDER — SODIUM CHLORIDE 0.9 % IV SOLN: Freq: Once | INTRAVENOUS | Status: AC

## 2019-11-07 NOTE — ED Notes (Signed)
Contacted Pt Placement regarding this pts admission orders; MD still has not admitted pt but is aware.

## 2019-11-07 NOTE — ED Notes (Signed)
[name] [phone]  Daughter please call for updates

## 2019-11-07 NOTE — ED Provider Notes (Signed)
4:15 PM-checkout from Dr. Adriana Simas to evaluate urinalysis and consider discharge.  Patient presented for evaluation of weakness.  Initial evaluation indicates worsening chronic renal failure.  Blood pressure low on arrival, improved with IV fluids.  Clinical Course as of Nov 06 1998  Mon Nov 07, 2019  1940 Normal except presence of protein, leukocytes, white cells, bacteria.  Culture ordered.  Urinalysis, Routine w reflex microscopic(!) [EW]  1941 Normal  Lipase, blood [EW]  1941 Normal except hemoglobin low  CBC with Differential(!) [EW]  1941 Normal except BUN high, creatinine high  Comprehensive metabolic panel(!) [EW]  1942 Normal  SARS CORONAVIRUS 2 (TAT 6-24 HRS) Nasopharyngeal Nasopharyngeal Swab [EW]  2000 No CHF or infiltrate, image interpreted by me  DG Chest Port 1 View [EW]    Clinical Course User Index [EW] Mancel Bale, MD   Medications  cefTRIAXone (ROCEPHIN) 1 g in sodium chloride 0.9 % 100 mL IVPB (has no administration in time range)  0.9 %  sodium chloride infusion ( Intravenous Stopped 11/07/19 1519)  alum & mag hydroxide-simeth (MAALOX/MYLANTA) 200-200-20 MG/5ML suspension 30 mL (30 mLs Oral Given 11/07/19 1231)  sodium chloride 0.9 % bolus 500 mL (0 mLs Intravenous Stopped 11/07/19 1650)    Patient Vitals for the past 24 hrs:  BP Temp Temp src Pulse Resp SpO2  11/07/19 1900 (!) 72/60 -- -- -- 13 --  11/07/19 1845 102/64 -- -- 62 18 100 %  11/07/19 1745 123/82 -- -- 63 17 99 %  11/07/19 1715 121/78 -- -- 66 -- 99 %  11/07/19 1700 118/78 -- -- 62 16 100 %  11/07/19 1645 (!) 108/93 -- -- 65 16 97 %  11/07/19 1630 (!) 117/91 -- -- 62 13 97 %  11/07/19 1600 101/69 -- -- 60 18 100 %  11/07/19 1500 105/76 -- -- 61 16 100 %  11/07/19 1347 -- -- -- -- -- 94 %  11/07/19 1330 107/73 -- -- -- 18 --  11/07/19 1315 111/70 -- -- -- 17 --  11/07/19 1300 112/74 -- -- -- 18 --  11/07/19 1200 118/71 -- -- -- 19 --  11/07/19 1115 118/74 -- -- -- 14 --  11/07/19 1030 (!) 86/68  -- -- -- 14 --  11/07/19 1028 (!) 88/58 98.1 F (36.7 C) Oral 65 12 100 %    EKG Interpretation  Date/Time:  Monday November 07 2019 10:29:44 EST Ventricular Rate:  60 PR Interval:    QRS Duration: 107 QT Interval:  502 QTC Calculation: 502 R Axis:   -15 Text Interpretation: Sinus rhythm Borderline left axis deviation Abnormal R-wave progression, early transition Borderline T abnormalities, anterior leads Prolonged QT interval Confirmed by Donnetta Hutching ([id number]) on 11/07/2019 11:48:17 AM        7:43 PM Reevaluation with update and discussion. After initial assessment and treatment, an updated evaluation reveals she is alert and cooperative.  She has been able eat a minimal amount of dinner.  She complains of "indigestion."  She states that she lives alone, and today was unable to walk.Mancel Bale   Medical Decision Making: Patient presenting with weakness.  She has had 2 hospitalizations within the last month, most recently for AKI.  Blood pressure low on arrival.  Evaluation consistent with acute on chronic renal failure, and urinary tract infection.  Blood pressure transiently improved, required additional IV fluids for recurrence of low blood pressure.  Patient appears chronically ill.  Patient is at risk for decompensation of discharge.  She may need  consideration for placement, for prevention of recurrent hospitalizations.  CRITICAL CARE-yes Performed by: Daleen Bo  .Critical Care Performed by: Daleen Bo, MD Authorized by: Daleen Bo, MD   Critical care provider statement:    Critical care time (minutes):  35   Critical care time was exclusive of:  Separately billable procedures and treating other patients   Critical care was necessary to treat or prevent imminent or life-threatening deterioration of the following conditions:  Circulatory failure   Critical care was time spent personally by me on the following activities:  Blood draw for specimens, development of  treatment plan with patient or surrogate, discussions with consultants, evaluation of patient's response to treatment, examination of patient, obtaining history from patient or surrogate, ordering and performing treatments and interventions, ordering and review of laboratory studies, pulse oximetry, re-evaluation of patient's condition, review of old charts and ordering and review of radiographic studies   8:03 PM-Consult complete with hospitalist. Patient case explained and discussed.  He agrees to admit patient for further evaluation and treatment. Call ended at 8:22 PM    Daleen Bo, MD 11/07/19 2022

## 2019-11-07 NOTE — ED Provider Notes (Addendum)
[facility] EMERGENCY DEPARTMENT Provider Note   CSN: [id number] Arrival date & time: 11/07/19  1008     History Chief Complaint  Patient presents with  . generalized weakness    Michelle Graves is a 69 y.o. female.  Level 5 caveat for confusing history.  Chief complaint generalized weakness.  Patient seen on 10/17/2019 with similar complaints.  Review of systems positive for left lateral flank pain, questionable blood on toilet tissue after wiping during urination.  No chest pain, dyspnea, fever, sweats, chills.  No known Covid exposures.  Past medical history includes acute on chronic kidney failure, malnutrition, hypertension, stroke, many others.        Past Medical History:  Diagnosis Date  . Arthritis   . Chest pain   . Diverticulitis   . Hypertension   . Stroke [facility])     Patient Active Problem List   Diagnosis Date Noted  . Urinary retention 10/28/2019  . Acute on chronic kidney failure (HCC) 10/27/2019  . Malnutrition of moderate degree 10/03/2019  . Colitis 09/30/2019  . AKI (acute kidney injury) (HCC) 05/28/2019  . Nausea & vomiting 05/28/2019  . 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 Anemia, chronic disease 05/28/2019  . Swelling   . Small vessel disease, cerebrovascular   . Acute ischemic stroke (HCC)   . General weakness   . Acute left ankle pain   . Acute on chronic renal failure (HCC) 05/13/2019  . Weakness of both lower extremities 05/13/2019  . Cerebral thrombosis with cerebral infarction 03/31/2019  . New onset a-fib (HCC) 03/27/2019  . Acute kidney injury superimposed on CKD (HCC) 03/24/2019  . Hypotension 03/24/2019  . Dark emesis 03/24/2019  . Dark stools 03/24/2019  . Diverticulitis of intestine without perforation or abscess without bleeding   . Sinus tachycardia 08/27/2016  . Diverticulitis 08/26/2016  . HYPERTENSION, MALIGNANT ESSENTIAL 06/24/2007  . KIDNEY DISEASE, CHRONIC, STAGE III 06/24/2007  . ARTHRITIS,  RHEUMATOID, SEROPOSITIVE 06/24/2007    Past Surgical History:  Procedure Laterality Date  . BIOPSY  04/01/2019   Procedure: BIOPSY;  Surgeon: Kathi Der, MD;  Location: [facility] ENDOSCOPY;  Service: Gastroenterology;;  . BIOPSY  10/05/2019   Procedure: BIOPSY;  Surgeon: Kathi Der, MD;  Location: [facility] ENDOSCOPY;  Service: Gastroenterology;;  . COLONOSCOPY WITH PROPOFOL N/A 04/01/2019   Procedure: COLONOSCOPY WITH PROPOFOL;  Surgeon: Kathi Der, MD;  Location: [facility] ENDOSCOPY;  Service: Gastroenterology;  Laterality: N/A;  . COLONOSCOPY WITH PROPOFOL N/A 10/05/2019   Procedure: COLONOSCOPY WITH PROPOFOL;  Surgeon: Kathi Der, MD;  Location: [facility] ENDOSCOPY;  Service: Gastroenterology;  Laterality: N/A;  . ESOPHAGOGASTRODUODENOSCOPY (EGD) WITH PROPOFOL N/A 04/01/2019   Procedure: ESOPHAGOGASTRODUODENOSCOPY (EGD) WITH PROPOFOL;  Surgeon: Kathi Der, MD;  Location: [facility] ENDOSCOPY;  Service: Gastroenterology;  Laterality: N/A;  . ESOPHAGOGASTRODUODENOSCOPY (EGD) WITH PROPOFOL N/A 10/05/2019   Procedure: ESOPHAGOGASTRODUODENOSCOPY (EGD) WITH PROPOFOL;  Surgeon: Kathi Der, MD;  Location: [facility] ENDOSCOPY;  Service: Gastroenterology;  Laterality: N/A;  . TONSILLECTOMY       OB History   No obstetric history on file.     Family History  Problem Relation Age of Onset  . Hypertension Mother   . Diabetes Mother   . CAD Father        died of MI at age 31  . Hypertension Father   . Diabetes Sister   . Diabetes Sister   . Kidney disease Neg Hx     Social History   Tobacco Use  . Smoking status: Never Smoker  .  Smokeless tobacco: Never Used  Substance Use Topics  . Alcohol use: Yes    Alcohol/week: 21.0 standard drinks    Types: 21 Glasses of wine per week    Comment: sometimes   . Drug use: No    Home Medications Prior to Admission medications   Medication Sig Start Date End Date Taking? Authorizing Provider  amLODipine (NORVASC) 10 MG tablet Take 10 mg by mouth  daily. 08/19/19   [provider]  aspirin EC 81 MG EC tablet Take 1 tablet (81 mg total) by mouth daily. 05/16/19   Jose Persia, MD  cloNIDine (CATAPRES) 0.3 MG tablet Take 0.3 mg by mouth 2 (two) times daily. 09/27/19   [provider]  clopidogrel (PLAVIX) 75 MG tablet Take 1 tablet (75 mg total) by mouth daily. 04/05/19   Aline August, MD  colestipol (COLESTID) 1 g tablet Take 1 tablet (1 g total) by mouth 2 (two) times daily. 05/16/19   Jose Persia, MD  docusate sodium (COLACE) 100 MG capsule Take 1 capsule (100 mg total) by mouth 2 (two) times daily. 06/02/19   Shahmehdi, Valeria Batman, MD  doxepin (SINEQUAN) 10 MG capsule Take 1 capsule (10 mg total) by mouth at bedtime. 10/28/19   Jose Persia, MD  DULoxetine (CYMBALTA) 60 MG capsule Take 60 mg by mouth at bedtime.     [provider]  feeding supplement, ENSURE ENLIVE, (ENSURE ENLIVE) LIQD Take 237 mLs by mouth 3 (three) times daily between meals. 06/02/19   Shahmehdi, Valeria Batman, MD  furosemide (LASIX) 20 MG tablet Take 20 mg by mouth daily. 10/13/19   [provider]  hydrochlorothiazide (HYDRODIURIL) 25 MG tablet Take 25 mg by mouth daily. 09/27/19   [provider]  HYDROcodone-acetaminophen (NORCO/VICODIN) 5-325 MG tablet Take 1 tablet by mouth 3 (three) times daily as needed. 10/14/19   [provider]  hydroxychloroquine (PLAQUENIL) 200 MG tablet Take 200 mg by mouth 2 (two) times daily.  06/16/16   [provider]  lisinopril (ZESTRIL) 40 MG tablet Take 40 mg by mouth daily. 08/16/19   [provider]  megestrol (MEGACE) 400 MG/10ML suspension Take 10 mLs (400 mg total) by mouth 2 (two) times daily. 06/02/19   Shahmehdi, Valeria Batman, MD  metoprolol tartrate (LOPRESSOR) 100 MG tablet Take 100 mg by mouth daily.  11/19/15   [provider]  Multiple Vitamin (MULTIVITAMIN WITH MINERALS) TABS tablet Take 1 tablet by mouth daily. 06/03/19   Shahmehdi, Valeria Batman, MD  MYRBETRIQ  25 MG TB24 tablet Take 25 mg by mouth daily. 10/18/19   [provider]  omeprazole (PRILOSEC) 20 MG capsule Take 20 mg by mouth as needed (acid reflux).  08/22/19   [provider]  ondansetron (ZOFRAN) 4 MG tablet Take 1 tablet (4 mg total) by mouth every 6 (six) hours as needed for nausea. Patient taking differently: Take 4 mg by mouth every 6 (six) hours as needed for nausea or vomiting.  04/04/19   Aline August, MD  pantoprazole (PROTONIX) 40 MG tablet Take 1 tablet (40 mg total) by mouth 2 (two) times daily. Patient not taking: Reported on 09/30/2019 04/04/19   Aline August, MD  prazosin (MINIPRESS) 1 MG capsule Take 1 mg by mouth at bedtime. 09/20/19   [provider]  tiZANidine (ZANAFLEX) 4 MG tablet Take 4 mg by mouth 2 (two) times daily. 08/22/19   [provider]    Allergies    Patient has no known allergies.  Review of  Systems   Review of Systems  Unable to perform ROS: Other    Physical Exam Updated Vital Signs BP 105/76   Pulse 61   Temp 98.1 F (36.7 C) (Oral)   Resp 16   SpO2 100%   Physical Exam Vitals and nursing note reviewed.  Constitutional:      Appearance: She is well-developed.     Comments: No acute distress  HENT:     Head: Normocephalic and atraumatic.  Eyes:     Conjunctiva/sclera: Conjunctivae normal.  Cardiovascular:     Rate and Rhythm: Normal rate and regular rhythm.  Pulmonary:     Effort: Pulmonary effort is normal.     Breath sounds: Normal breath sounds.  Abdominal:     General: Bowel sounds are normal.     Palpations: Abdomen is soft.  Genitourinary:    Comments: Tender left flank. Musculoskeletal:        General: Normal range of motion.     Cervical back: Neck supple.  Skin:    General: Skin is warm and dry.  Neurological:     General: No focal deficit present.     Mental Status: She is alert and oriented to person, place, and time.  Psychiatric:        Behavior: Behavior normal.      ED Results / Procedures / Treatments   Labs (all labs ordered are listed, but only abnormal results are displayed) Labs Reviewed  CBC WITH DIFFERENTIAL/PLATELET - Abnormal; Notable for the following components:      Result Value   RBC 3.86 (*)    Hemoglobin 11.3 (*)    HCT 35.6 (*)    All other components within normal limits  SARS CORONAVIRUS 2 (TAT 6-24 HRS)  COMPREHENSIVE METABOLIC PANEL  URINALYSIS, ROUTINE W REFLEX MICROSCOPIC  LIPASE, BLOOD  TYPE AND SCREEN    EKG EKG Interpretation  Date/Time:  Monday November 07 2019 10:29:44 EST Ventricular Rate:  60 PR Interval:    QRS Duration: 107 QT Interval:  502 QTC Calculation: 502 R Axis:   -15 Text Interpretation: Sinus rhythm Borderline left axis deviation Abnormal R-wave progression, early transition Borderline T abnormalities, anterior leads Prolonged QT interval Confirmed by Donnetta Hutching ([id number]) on 11/07/2019 11:48:17 AM   Radiology DG Chest Port 1 View  Result Date: 11/07/2019 CLINICAL DATA:  Weakness EXAM: PORTABLE CHEST 1 VIEW COMPARISON:  06/16/2019 FINDINGS: Borderline heart size. Aortic tortuosity. There is no edema, consolidation, effusion, or pneumothorax. IMPRESSION: No evidence of acute disease. Electronically Signed   By: Marnee Spring M.D.   On: 11/07/2019 11:55    Procedures Procedures (including critical care time)  Medications Ordered in ED Medications  0.9 %  sodium chloride infusion ( Intravenous Stopped 11/07/19 1519)  alum & mag hydroxide-simeth (MAALOX/MYLANTA) 200-200-20 MG/5ML suspension 30 mL (30 mLs Oral Given 11/07/19 1231)  sodium chloride 0.9 % bolus 500 mL (500 mLs Intravenous New Bag/Given 11/07/19 1525)    ED Course  I have reviewed the triage vital signs and the nursing notes.  Pertinent labs & imaging results that were available during my care of the patient were reviewed by me and considered in my medical decision making (see chart for details).    MDM  Rules/Calculators/A&P                      Chief complaint weakness.  She is slightly hypotensive, but does not appear to be an extremis.  Will do basic labs, chest x-ray,  urinalysis, Covid.   CAYDENCE KOENIG was evaluated in Emergency Department on 11/07/2019 for the symptoms described in the history of present illness. She was evaluated in the context of the global COVID-19 pandemic, which necessitated consideration that the patient might be at risk for infection with the SARS-CoV-2 virus that causes COVID-19. Institutional protocols and algorithms that pertain to the evaluation of patients at risk for COVID-19 are in a state of rapid change based on information released by regulatory bodies including the CDC and federal and state organizations. These policies and algorithms were followed during the patient's care in the ED. Final Clinical Impression(s) / ED Diagnoses Final diagnoses:  Weakness    Rx / DC Orders ED Discharge Orders    None       Donnetta Hutching, MD 11/07/19 1112    Donnetta Hutching, MD 11/07/19 1547

## 2019-11-07 NOTE — ED Notes (Signed)
Unable to get blood draws, phlebotomy to attempt.

## 2019-11-07 NOTE — ED Triage Notes (Signed)
To ED via [facility] from [facility]-- independent apartment-- pt c/o generalized weakness-- has recently been in the hospital for similar complaints-- pt was hypotensive  on EMS arrival, received 150cc bolus--

## 2019-11-07 NOTE — ED Notes (Signed)
Re-instructed pt on voiding; will reassess in 10 minutes.

## 2019-11-07 NOTE — H&P (Addendum)
History and Physical    Michelle Graves YHC:[id number] DOB: August 14, 1951 DOA: 11/07/2019  PCP: [facility]   Patient coming from: Home  I have personally briefly reviewed patient's old medical records in [facility]  Chief Complaint: Weakness  HPI: Michelle Graves is a 69 y.o. 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  Patient has had two previous presentations with similar symptom.  Patient reports presenting to the ER today due to weakness.  She reports she was brought in by a friend but for the past day she has been feeling really tired.  She does not complain of any focal deficits but states in the past couple of weeks she has had 5 or 6 falls.  She states she will be using her walker/rollator and dip down, describing mechanical in nature.  She denies ever having hitting her head.  Today she reports more lightheadedness/weakness.  Over the past week she has not been very ambulatory.  It is unclear if she took her blood pressure medications correctly.  She denies any fevers, chills, cough, nausea/vomiting, diarrhea.  She denies any rashes but points to bruise on her RLE from one of her falls this past week.  She does report she has been urinating more frequently and states she was recently treated for a UTI with similar symptoms.  She lives independently but reports that her daughter lives nearby.  No tobacco, alcohol or drug use.   Review of Systems: As per HPI otherwise 10 point review of systems negative.    Past Medical History:  Diagnosis Date  . Arthritis   . Chest pain   . Diverticulitis   . Hypertension   . Stroke [facility])     Past Surgical History:  Procedure Laterality Date  . BIOPSY  04/01/2019   Procedure: BIOPSY;  Surgeon: Kathi Der, MD;  Location: [facility] ENDOSCOPY;  Service: Gastroenterology;;  . BIOPSY  10/05/2019    Procedure: BIOPSY;  Surgeon: Kathi Der, MD;  Location: [facility] ENDOSCOPY;  Service: Gastroenterology;;  . COLONOSCOPY WITH PROPOFOL N/A 04/01/2019   Procedure: COLONOSCOPY WITH PROPOFOL;  Surgeon: Kathi Der, MD;  Location: [facility] ENDOSCOPY;  Service: Gastroenterology;  Laterality: N/A;  . COLONOSCOPY WITH PROPOFOL N/A 10/05/2019   Procedure: COLONOSCOPY WITH PROPOFOL;  Surgeon: Kathi Der, MD;  Location: [facility] ENDOSCOPY;  Service: Gastroenterology;  Laterality: N/A;  . ESOPHAGOGASTRODUODENOSCOPY (EGD) WITH PROPOFOL N/A 04/01/2019   Procedure: ESOPHAGOGASTRODUODENOSCOPY (EGD) WITH PROPOFOL;  Surgeon: Kathi Der, MD;  Location: [facility] ENDOSCOPY;  Service: Gastroenterology;  Laterality: N/A;  . ESOPHAGOGASTRODUODENOSCOPY (EGD) WITH PROPOFOL N/A 10/05/2019   Procedure: ESOPHAGOGASTRODUODENOSCOPY (EGD) WITH PROPOFOL;  Surgeon: Kathi Der, MD;  Location: [facility] ENDOSCOPY;  Service: Gastroenterology;  Laterality: N/A;  . TONSILLECTOMY       reports that she has never smoked. She has never used smokeless tobacco. She reports current alcohol use of about 21.0 standard drinks of alcohol per week. She reports that she does not use drugs.  No Known Allergies  Family History  Problem Relation Age of Onset  . Hypertension Mother   . Diabetes Mother   . CAD Father        died of MI at age 60  . Hypertension Father   . Diabetes Sister   . Diabetes Sister   . Kidney disease Neg Hx      Prior to Admission medications   Medication Sig Start Date End Date Taking?  Authorizing Provider  amLODipine (NORVASC) 10 MG tablet Take 10 mg by mouth daily. 08/19/19   [provider]  aspirin EC 81 MG EC tablet Take 1 tablet (81 mg total) by mouth daily. 05/16/19   Jose Persia, MD  cloNIDine (CATAPRES) 0.3 MG tablet Take 0.3 mg by mouth 2 (two) times daily. 09/27/19   [provider]  clopidogrel (PLAVIX) 75 MG tablet Take 1 tablet (75 mg total) by mouth daily. 04/05/19   Aline August, MD  colestipol (COLESTID) 1 g tablet Take 1 tablet (1 g total) by mouth 2 (two) times daily. 05/16/19   Jose Persia, MD  docusate sodium (COLACE) 100 MG capsule Take 1 capsule (100 mg total) by mouth 2 (two) times daily. 06/02/19   Shahmehdi, Valeria Batman, MD  doxepin (SINEQUAN) 10 MG capsule Take 1 capsule (10 mg total) by mouth at bedtime. 10/28/19   Jose Persia, MD  DULoxetine (CYMBALTA) 60 MG capsule Take 60 mg by mouth at bedtime.     [provider]  feeding supplement, ENSURE ENLIVE, (ENSURE ENLIVE) LIQD Take 237 mLs by mouth 3 (three) times daily between meals. 06/02/19   Shahmehdi, Valeria Batman, MD  furosemide (LASIX) 20 MG tablet Take 20 mg by mouth daily. 10/13/19   [provider]  hydrochlorothiazide (HYDRODIURIL) 25 MG tablet Take 25 mg by mouth daily. 09/27/19   [provider]  HYDROcodone-acetaminophen (NORCO/VICODIN) 5-325 MG tablet Take 1 tablet by mouth 3 (three) times daily as needed. 10/14/19   [provider]  hydroxychloroquine (PLAQUENIL) 200 MG tablet Take 200 mg by mouth 2 (two) times daily.  06/16/16   [provider]  lisinopril (ZESTRIL) 40 MG tablet Take 40 mg by mouth daily. 08/16/19   [provider]  megestrol (MEGACE) 400 MG/10ML suspension Take 10 mLs (400 mg total) by mouth 2 (two) times daily. 06/02/19   Shahmehdi, Valeria Batman, MD  metoprolol tartrate (LOPRESSOR) 100 MG tablet Take 100 mg by mouth daily.  11/19/15   [provider]  Multiple Vitamin (MULTIVITAMIN WITH MINERALS) TABS tablet Take 1 tablet by mouth daily. 06/03/19   Shahmehdi, Valeria Batman, MD  MYRBETRIQ 25 MG TB24 tablet Take 25 mg by mouth daily. 10/18/19   [provider]  omeprazole (PRILOSEC) 20 MG capsule Take 20 mg by mouth as needed (acid reflux).  08/22/19   [provider]  ondansetron (ZOFRAN) 4 MG tablet Take 1 tablet (4 mg total) by mouth every 6 (six) hours as needed for nausea. Patient taking differently: Take 4 mg by  mouth every 6 (six) hours as needed for nausea or vomiting.  04/04/19   Aline August, MD  pantoprazole (PROTONIX) 40 MG tablet Take 1 tablet (40 mg total) by mouth 2 (two) times daily. Patient not taking: Reported on 09/30/2019 04/04/19   Aline August, MD  prazosin (MINIPRESS) 1 MG capsule Take 1 mg by mouth at bedtime. 09/20/19   [provider]  tiZANidine (ZANAFLEX) 4 MG tablet Take 4 mg by mouth 2 (two) times daily. 08/22/19   [provider]    Physical Exam: Vitals:   11/07/19 1845 11/07/19 1900 11/07/19 1930 11/07/19 1945  BP: 102/64 (!) 72/60 112/65 105/74  Pulse: 62  63 65  Resp: 18 13 19 16   Temp:      TempSrc:      SpO2: 100%  100% 96%    Vitals:   11/07/19 1845 11/07/19 1900 11/07/19 1930 11/07/19 1945  BP: 102/64 (!) 72/60 112/65 105/74  Pulse: 62  63 65  Resp: 18 13 19 16   Temp:      TempSrc:      SpO2: 100%  100% 96%     Constitutional: NAD, calm, comfortable, wrapped in multiple blankets Eyes: PERRL, EOMI, anicteric sclera ENMT: Mucous membranes are moist. Posterior pharynx clear of any exudate or lesions.Normal dentition.  Neck: normal, supple, no masses, no thyromegaly Respiratory: clear to auscultation bilaterally, no wheezing, no crackles. Normal respiratory effort. No accessory muscle use.  Cardiovascular: Regular rate and rhythm, no murmurs / rubs / gallops. No extremity edema. 2+ pedal pulses. No carotid bruits.  Abdomen: no tenderness, no masses palpated. No hepatosplenomegaly. Bowel sounds positive.  Musculoskeletal: no clubbing / cyanosis. No joint deformity upper and lower extremities. Good ROM, no contractures. Normal muscle tone.  Skin: no rashes, lesions, ulcers. No induration.  Bruise to RLE Neurologic: CN 2-12 grossly intact. No new motor or sensory deficits appreciated, able to resist in all extremities Psychiatric: Normal judgment and insight. Alert and oriented x 3. Normal mood.    Labs on Admission: I have personally  reviewed following labs and imaging studies  CBC: Recent Labs  Lab 11/07/19 1455  WBC 5.4  NEUTROABS 3.1  HGB 11.3*  HCT 35.6*  MCV 92.2  PLT 222   Basic Metabolic Panel: Recent Labs  Lab 11/07/19 1455  NA 136  K 3.7  CL 104  CO2 22  GLUCOSE 98  BUN 25*  CREATININE 2.64*  CALCIUM 9.8   GFR: Estimated Creatinine Clearance: 22.9 mL/min (A) (by C-G formula based on SCr of 2.64 mg/dL (H)). Liver Function Tests: Recent Labs  Lab 11/07/19 1455  AST 17  ALT 11  ALKPHOS 52  BILITOT 0.6  PROT 6.8  ALBUMIN 3.5   Recent Labs  Lab 11/07/19 1455  LIPASE 20   No results for input(s): AMMONIA in the last 168 hours. Coagulation Profile: No results for input(s): INR, PROTIME in the last 168 hours. Cardiac Enzymes: No results for input(s): CKTOTAL, CKMB, CKMBINDEX, TROPONINI in the last 168 hours. BNP (last 3 results) No results for input(s): PROBNP in the last 8760 hours. HbA1C: No results for input(s): HGBA1C in the last 72 hours. CBG: No results for input(s): GLUCAP in the last 168 hours. Lipid Profile: No results for input(s): CHOL, HDL, LDLCALC, TRIG, CHOLHDL, LDLDIRECT in the last 72 hours. Thyroid Function Tests: No results for input(s): TSH, T4TOTAL, FREET4, T3FREE, THYROIDAB in the last 72 hours. Anemia Panel: No results for input(s): VITAMINB12, FOLATE, FERRITIN, TIBC, IRON, RETICCTPCT in the last 72 hours. Urine analysis:    Component Value Date/Time   COLORURINE AMBER (A) 11/07/2019 1800   APPEARANCEUR CLOUDY (A) 11/07/2019 1800   LABSPEC 1.015 11/07/2019 1800   PHURINE 6.0 11/07/2019 1800   GLUCOSEU NEGATIVE 11/07/2019 1800   HGBUR NEGATIVE 11/07/2019 1800   BILIRUBINUR NEGATIVE 11/07/2019 1800   KETONESUR NEGATIVE 11/07/2019 1800   PROTEINUR 30 (A) 11/07/2019 1800   UROBILINOGEN 1.0 06/24/2010 1850   NITRITE NEGATIVE 11/07/2019 1800   LEUKOCYTESUR TRACE (A) 11/07/2019 1800    Radiological Exams on Admission: DG Chest Port 1 View  Result  Date: 11/07/2019 CLINICAL DATA:  Weakness EXAM: PORTABLE CHEST 1 VIEW COMPARISON:  06/16/2019 FINDINGS: Borderline heart size. Aortic tortuosity. There is no edema, consolidation, effusion, or pneumothorax. IMPRESSION: No evidence of acute disease. Electronically Signed   By: 06/18/2019 M.D.   On: 11/07/2019 11:55    Assessment/Plan Michelle Graves is a 69 y.o. female with  medical history significant of Rheumatoid Arthritis, CMV colitis, Ischemic CVA (reports LUE deficit), who presents with generalized weakness and falls.  # Generalized weakness/Falls - no syncope, remembers episodes and denies any hx of head trauma.  No new focal deficits to suggest CVA, though predisposes to above.  She was notably hypotensive today and is on several anti hypertensive meds at home including amlodipine, clonidine, HCTZ, Prazosin, lisinopril and metoprolol for presumably resistant HTN.  She is also on centrally acting agents.  Her symptoms improved greatly with fluid resuscitation.  Separately she does have urinary frequency and pyuria which may also be contributing - held anti-hypertensives except for metoprolol given she remains hypotensive (monitor for rebound, clonidine has been held) - would benefit from med rec (reducing some meds with plan to narrow further with PCP) at d/c, may require higher level of care - PT/OT (patient previously decline SNF)  # AKI - suspect pre-renal, also probably from hypotension which may be iatrogenic from meds - anticipate improvement on re-check  # Acute LUTI - Urine Cx pending/ UA somewhat borderline - continue ceftriaxone  # hx of CVA - continue aspirin, plavix, should follow-up with Neuro - no presently on statin, continue colestipol  # HTN - held meds except for metoprolol due to hypotension (improved with fluids, symptoms as well)  # Rheumatoid Arthritis - continue hydroxychloroquine  # Protein Calorie malnutrition - currently on Megace - would  eventually d/c Megace if appetite remains improved  # GERD - continue PPI  DVT prophylaxis: Lovenox Code Status: Full Disposition Plan: pending, possible SNF if patient amenable Admission status: inpatient   [name] MD [facility] Pager [phone]  If 7PM-7AM, please contact night-coverage [email or web address] Password [facility]  11/07/2019, 8:21 PM

## 2019-11-07 NOTE — ED Notes (Signed)
Instructed pt let staff know when she urinates for UA.

## 2019-11-08 ENCOUNTER — Other Ambulatory Visit: Payer: Self-pay

## 2019-11-08 ENCOUNTER — Encounter (HOSPITAL_COMMUNITY): Payer: Self-pay | Admitting: Internal Medicine

## 2019-11-08 DIAGNOSIS — D638 Anemia in other chronic diseases classified elsewhere: Secondary | ICD-10-CM | POA: Diagnosis not present

## 2019-11-08 DIAGNOSIS — Z833 Family history of diabetes mellitus: Secondary | ICD-10-CM | POA: Diagnosis not present

## 2019-11-08 DIAGNOSIS — I5032 Chronic diastolic (congestive) heart failure: Secondary | ICD-10-CM | POA: Diagnosis present

## 2019-11-08 DIAGNOSIS — F331 Major depressive disorder, recurrent, moderate: Secondary | ICD-10-CM | POA: Diagnosis present

## 2019-11-08 DIAGNOSIS — N179 Acute kidney failure, unspecified: Secondary | ICD-10-CM | POA: Diagnosis present

## 2019-11-08 DIAGNOSIS — N183 Chronic kidney disease, stage 3 unspecified: Secondary | ICD-10-CM

## 2019-11-08 DIAGNOSIS — Z7982 Long term (current) use of aspirin: Secondary | ICD-10-CM | POA: Diagnosis not present

## 2019-11-08 DIAGNOSIS — N390 Urinary tract infection, site not specified: Secondary | ICD-10-CM | POA: Diagnosis present

## 2019-11-08 DIAGNOSIS — I69334 Monoplegia of upper limb following cerebral infarction affecting left non-dominant side: Secondary | ICD-10-CM | POA: Diagnosis not present

## 2019-11-08 DIAGNOSIS — I9589 Other hypotension: Secondary | ICD-10-CM

## 2019-11-08 DIAGNOSIS — N3000 Acute cystitis without hematuria: Secondary | ICD-10-CM

## 2019-11-08 DIAGNOSIS — R531 Weakness: Secondary | ICD-10-CM | POA: Diagnosis not present

## 2019-11-08 DIAGNOSIS — K219 Gastro-esophageal reflux disease without esophagitis: Secondary | ICD-10-CM | POA: Diagnosis present

## 2019-11-08 DIAGNOSIS — M069 Rheumatoid arthritis, unspecified: Secondary | ICD-10-CM | POA: Diagnosis present

## 2019-11-08 DIAGNOSIS — I130 Hypertensive heart and chronic kidney disease with heart failure and stage 1 through stage 4 chronic kidney disease, or unspecified chronic kidney disease: Secondary | ICD-10-CM | POA: Diagnosis present

## 2019-11-08 DIAGNOSIS — Z79899 Other long term (current) drug therapy: Secondary | ICD-10-CM | POA: Diagnosis not present

## 2019-11-08 DIAGNOSIS — Z20822 Contact with and (suspected) exposure to covid-19: Secondary | ICD-10-CM | POA: Diagnosis present

## 2019-11-08 DIAGNOSIS — Z7902 Long term (current) use of antithrombotics/antiplatelets: Secondary | ICD-10-CM | POA: Diagnosis not present

## 2019-11-08 DIAGNOSIS — M1990 Unspecified osteoarthritis, unspecified site: Secondary | ICD-10-CM | POA: Diagnosis present

## 2019-11-08 DIAGNOSIS — D631 Anemia in chronic kidney disease: Secondary | ICD-10-CM | POA: Diagnosis present

## 2019-11-08 DIAGNOSIS — G4700 Insomnia, unspecified: Secondary | ICD-10-CM | POA: Diagnosis present

## 2019-11-08 DIAGNOSIS — I951 Orthostatic hypotension: Secondary | ICD-10-CM | POA: Diagnosis present

## 2019-11-08 DIAGNOSIS — E861 Hypovolemia: Secondary | ICD-10-CM

## 2019-11-08 DIAGNOSIS — E46 Unspecified protein-calorie malnutrition: Secondary | ICD-10-CM | POA: Diagnosis present

## 2019-11-08 DIAGNOSIS — E860 Dehydration: Secondary | ICD-10-CM | POA: Diagnosis present

## 2019-11-08 DIAGNOSIS — Z8249 Family history of ischemic heart disease and other diseases of the circulatory system: Secondary | ICD-10-CM | POA: Diagnosis not present

## 2019-11-08 MED ORDER — HYDROCODONE-ACETAMINOPHEN 5-325 MG PO TABS
1.0000 | ORAL_TABLET | Freq: Three times a day (TID) | ORAL | Status: DC | PRN
  Administered 2019-11-08: 1 via ORAL
  Filled 2019-11-08: qty 1

## 2019-11-08 MED ORDER — SENNOSIDES-DOCUSATE SODIUM 8.6-50 MG PO TABS: 1.0000 | ORAL_TABLET | Freq: Every evening | ORAL | Status: DC | PRN

## 2019-11-08 MED ORDER — SODIUM CHLORIDE 0.9 % IV SOLN: INTRAVENOUS | Status: DC

## 2019-11-08 MED ORDER — COLESTIPOL HCL 1 G PO TABS
1.0000 g | ORAL_TABLET | Freq: Two times a day (BID) | ORAL | Status: DC
  Administered 2019-11-08 – 2019-11-09 (×3): 1 g via ORAL
  Filled 2019-11-08 (×4): qty 1

## 2019-11-08 MED ORDER — METOPROLOL TARTRATE 100 MG PO TABS
100.0000 mg | ORAL_TABLET | Freq: Every day | ORAL | Status: DC
  Administered 2019-11-08 – 2019-11-09 (×2): 100 mg via ORAL
  Filled 2019-11-08 (×2): qty 1

## 2019-11-08 MED ORDER — CLOPIDOGREL BISULFATE 75 MG PO TABS
75.0000 mg | ORAL_TABLET | Freq: Every day | ORAL | Status: DC
  Administered 2019-11-08 – 2019-11-09 (×2): 75 mg via ORAL
  Filled 2019-11-08 (×2): qty 1

## 2019-11-08 MED ORDER — HYDROXYCHLOROQUINE SULFATE 200 MG PO TABS
200.0000 mg | ORAL_TABLET | Freq: Two times a day (BID) | ORAL | Status: DC
  Administered 2019-11-08 – 2019-11-09 (×3): 200 mg via ORAL
  Filled 2019-11-08 (×3): qty 1

## 2019-11-08 MED ORDER — DOCUSATE SODIUM 100 MG PO CAPS
100.0000 mg | ORAL_CAPSULE | Freq: Two times a day (BID) | ORAL | Status: DC
  Administered 2019-11-08 – 2019-11-09 (×3): 100 mg via ORAL
  Filled 2019-11-08 (×3): qty 1

## 2019-11-08 MED ORDER — SODIUM CHLORIDE 0.9 % IV SOLN
1.0000 g | INTRAVENOUS | Status: DC
  Administered 2019-11-08: 1 g via INTRAVENOUS
  Filled 2019-11-08: qty 10
  Filled 2019-11-08: qty 1

## 2019-11-08 MED ORDER — ONDANSETRON HCL 4 MG/2ML IJ SOLN: 4.0000 mg | Freq: Four times a day (QID) | INTRAMUSCULAR | Status: DC | PRN

## 2019-11-08 MED ORDER — ONDANSETRON HCL 4 MG PO TABS: 4.0000 mg | ORAL_TABLET | Freq: Four times a day (QID) | ORAL | Status: DC | PRN

## 2019-11-08 MED ORDER — TIZANIDINE HCL 2 MG PO TABS
4.0000 mg | ORAL_TABLET | Freq: Two times a day (BID) | ORAL | Status: DC
  Administered 2019-11-08 – 2019-11-09 (×4): 4 mg via ORAL
  Filled 2019-11-08 (×4): qty 2

## 2019-11-08 MED ORDER — ADULT MULTIVITAMIN W/MINERALS CH
1.0000 | ORAL_TABLET | Freq: Every day | ORAL | Status: DC
  Administered 2019-11-08 – 2019-11-09 (×2): 1 via ORAL
  Filled 2019-11-08 (×2): qty 1

## 2019-11-08 MED ORDER — PANTOPRAZOLE SODIUM 40 MG PO TBEC
40.0000 mg | DELAYED_RELEASE_TABLET | Freq: Every day | ORAL | Status: DC
  Administered 2019-11-08 – 2019-11-09 (×2): 40 mg via ORAL
  Filled 2019-11-08 (×2): qty 1

## 2019-11-08 MED ORDER — DULOXETINE HCL 60 MG PO CPEP
60.0000 mg | ORAL_CAPSULE | Freq: Every day | ORAL | Status: DC
  Administered 2019-11-08: 60 mg via ORAL
  Filled 2019-11-08: qty 1

## 2019-11-08 MED ORDER — MIRABEGRON ER 25 MG PO TB24
25.0000 mg | ORAL_TABLET | Freq: Every day | ORAL | Status: DC
  Administered 2019-11-08 – 2019-11-09 (×2): 25 mg via ORAL
  Filled 2019-11-08 (×2): qty 1

## 2019-11-08 MED ORDER — ACETAMINOPHEN 325 MG PO TABS
650.0000 mg | ORAL_TABLET | Freq: Four times a day (QID) | ORAL | Status: DC | PRN
  Filled 2019-11-08: qty 2

## 2019-11-08 MED ORDER — ENSURE ENLIVE PO LIQD
237.0000 mL | Freq: Three times a day (TID) | ORAL | Status: DC
  Administered 2019-11-08: 237 mL via ORAL

## 2019-11-08 MED ORDER — SODIUM CHLORIDE 0.9 % IV SOLN: INTRAVENOUS | Status: AC

## 2019-11-08 MED ORDER — ENOXAPARIN SODIUM 30 MG/0.3ML ~~LOC~~ SOLN
30.0000 mg | SUBCUTANEOUS | Status: DC
  Administered 2019-11-08: 30 mg via SUBCUTANEOUS
  Filled 2019-11-08: qty 0.3

## 2019-11-08 MED ORDER — ASPIRIN EC 81 MG PO TBEC
81.0000 mg | DELAYED_RELEASE_TABLET | Freq: Every day | ORAL | Status: DC
  Administered 2019-11-08 – 2019-11-09 (×2): 81 mg via ORAL
  Filled 2019-11-08 (×2): qty 1

## 2019-11-08 MED ORDER — ENSURE ENLIVE PO LIQD
237.0000 mL | Freq: Three times a day (TID) | ORAL | Status: DC
  Administered 2019-11-08 – 2019-11-09 (×4): 237 mL via ORAL

## 2019-11-08 MED ORDER — ACETAMINOPHEN 650 MG RE SUPP: 650.0000 mg | Freq: Four times a day (QID) | RECTAL | Status: DC | PRN

## 2019-11-08 MED ORDER — MEGESTROL ACETATE 400 MG/10ML PO SUSP
400.0000 mg | Freq: Two times a day (BID) | ORAL | Status: DC
  Administered 2019-11-08 – 2019-11-09 (×3): 400 mg via ORAL
  Filled 2019-11-08 (×4): qty 10

## 2019-11-08 NOTE — Progress Notes (Signed)
Spoke with [name] patient daughter and gave update that patient has UTI and is confused. Daughter stated that patient had called her to tell her she is going home. I asked if this was patient normal behavior she stated only when she has UTI. Ilean Skill LPN

## 2019-11-08 NOTE — Evaluation (Signed)
Physical Therapy Evaluation Patient Details Name: Michelle Graves MRN: [id number] DOB: [birth year] Today's Date: 11/08/2019   History of Present Illness  69 yo admitted with weakness, AMS and UTI with AKI superimposed on CKD. PMHx: ischemic stroke, RA, colitis  Clinical Impression  Pt with feeding assistance of tech on arrival as pt apparently unaware of breakfast tray and did not eat without cues to do so. Pt with ability to ambulate to bathroom and perform pericare with cues but declined further gait stating back pain. Pt with decreased activity tolerance, transfers, gait and cognition who will benefit from acute therapy to maximize mobility, function and independence to decrease burden of care.    Follow Up Recommendations Home health PT;Supervision/Assistance - 24 hour(initial supervision for cognition)    Equipment Recommendations  None recommended by PT    Recommendations for Other Services       Precautions / Restrictions Precautions Precautions: Fall Restrictions Weight Bearing Restrictions: No      Mobility  Bed Mobility Overal bed mobility: Needs Assistance Bed Mobility: Supine to Sit     Supine to sit: HOB elevated;Min guard Sit to supine: Supervision   General bed mobility comments: guarding for safety with cues to initiate with use of rail and HOB 30 degrees  Transfers Overall transfer level: Needs assistance Equipment used: Rolling walker (2 wheeled) Transfers: Sit to/from Stand Sit to Stand: Min guard;Min assist         General transfer comment: minguard to rise from bed, min assist to rise from toilet with rail and cues for safety  Ambulation/Gait Ambulation/Gait assistance: Min guard Gait Distance (Feet): 15 Feet Assistive device: Rolling walker (2 wheeled) Gait Pattern/deviations: Step-through pattern;Decreased stride length   Gait velocity interpretation: 1.31 - 2.62 ft/sec, indicative of limited community ambulator General Gait Details: cues  for proximity to RW and direction  Stairs            Wheelchair Mobility    Modified Rankin (Stroke Patients Only)       Balance Overall balance assessment: Needs assistance;History of Falls Sitting-balance support: Feet supported;No upper extremity supported Sitting balance-[name]: Good Sitting balance - Comments: pt able to sit EOB and at toilet without assist   Standing balance support: During functional activity;Bilateral upper extremity supported Standing balance-[name]: Fair Standing balance comment: pt able to static stand without UE support, RW for gait                             Pertinent Vitals/Pain Pain Assessment: 0-10 Faces Pain Scale: Hurts little more Pain Location: lower back  Pain Descriptors / Indicators: Aching Pain Intervention(s): Limited activity within patient's tolerance;Monitored during session;Repositioned    Home Living Family/patient expects to be discharged to:: Private residence Living Arrangements: Alone Available Help at Discharge: Family;Available PRN/intermittently Type of Home: Apartment Home Access: Level entry;Elevator   Entrance Stairs-Number of Steps: pt reports that her apartment building has a level entry and that takes elevator to her 5th floor apartment Home Layout: One level Home Equipment: [place] - 2 wheels;Walker - 4 wheels;Cane - quad;Shower seat Additional Comments: Patients daughter lives around the corner     Prior Function Level of Independence: Independent with assistive device(s)         Comments: RW for gait     Hand Dominance   Dominant Hand: Left    Extremity/Trunk Assessment   Upper Extremity Assessment Upper Extremity Assessment: Defer to OT evaluation  Lower Extremity Assessment Lower Extremity Assessment: Generalized weakness    Cervical / Trunk Assessment Cervical / Trunk Assessment: Normal  Communication   Communication: No difficulties  Cognition  Arousal/Alertness: Awake/alert Behavior During Therapy: Flat affect Overall Cognitive Status: Impaired/Different from baseline Area of Impairment: Orientation;Following commands;Safety/judgement;Problem solving;Memory                 Orientation Level: Disoriented to;Situation;Time   Memory: Decreased short-term memory Following Commands: Follows one step commands consistently Safety/Judgement: Decreased awareness of safety   Problem Solving: Difficulty sequencing;Requires verbal cues;Requires tactile cues General Comments: pt unaware of situation, decreased safety pushing RW to the side even with cues for position, breakfast tray present but pt unaware      General Comments      Exercises     Assessment/Plan    PT Assessment Patient needs continued PT services  PT Problem List Decreased strength;Decreased range of motion;Decreased activity tolerance;Decreased mobility;Decreased balance;Decreased coordination;Decreased knowledge of use of DME;Decreased safety awareness;Decreased cognition       PT Treatment Interventions DME instruction;Gait training;Stair training;Functional mobility training;Therapeutic activities;Therapeutic exercise;Balance training;Neuromuscular re-education;Patient/family education    PT Goals (Current goals can be found in the Care Plan section)  Acute Rehab PT Goals Patient Stated Goal: to go home PT Goal Formulation: With patient Time For Goal Achievement: 11/22/19 Potential to Achieve Goals: Good    Frequency Min 3X/week   Barriers to discharge Decreased caregiver support      Co-evaluation               AM-PAC PT "6 Clicks" Mobility  Outcome Measure Help needed turning from your back to your side while in a flat bed without using bedrails?: A Little Help needed moving from lying on your back to sitting on the side of a flat bed without using bedrails?: A Little Help needed moving to and from a bed to a chair (including a  wheelchair)?: A Little Help needed standing up from a chair using your arms (e.g., wheelchair or bedside chair)?: A Little Help needed to walk in hospital room?: A Little Help needed climbing 3-5 steps with a railing? : A Little 6 Click Score: 18    End of Session Equipment Utilized During Treatment: Gait belt Activity Tolerance: Patient tolerated treatment well Patient left: in chair;with call bell/phone within reach;with chair alarm set Nurse Communication: Mobility status PT Visit Diagnosis: Other abnormalities of gait and mobility (R26.89);Muscle weakness (generalized) (M62.81)    Time: 8841-6606 PT Time Calculation (min) (ACUTE ONLY): 16 min   Charges:   PT Evaluation $PT Eval Moderate Complexity: 1 Mod          Michelle Graves, PT Acute Rehabilitation Services Pager: [phone] Office: [phone]   Michelle Graves 11/08/2019, 1:05 PM

## 2019-11-08 NOTE — ED Notes (Signed)
ED TO INPATIENT HANDOFF REPORT  ED Nurse Name and Phone #: Lenord Carbo [phone]  S Name/Age/Gender Michelle Graves 69 y.o. female Room/Bed: 038C/038C  Code Status   Code Status: Full Code  Home/SNF/Other Home Patient oriented to: self, place and time Is this baseline? Yes   Triage Complete: Triage complete  Chief Complaint UTI (urinary tract infection) [N39.0]  Triage Note To ED via [facility] from [facility]-- independent apartment-- pt c/o generalized weakness-- has recently been in the hospital for similar complaints-- pt was hypotensive  on EMS arrival, received 150cc bolus--      Allergies No Known Allergies  Level of Care/Admitting Diagnosis ED Disposition    ED Disposition Condition Comment   Admit  Hospital Area: [facility] [[id number]]  Level of Care: Med-Surg [16]  I expect the patient will be discharged within 24 hours: No (not a candidate for 5C-Observation unit)  Covid Evaluation: Asymptomatic Screening Protocol (No Symptoms)  Diagnosis: UTI (urinary tract infection) [347425]  Admitting Physician: Michelle Graves [[id number]]  Attending Physician: Michelle Graves [[id number]]  PT Class (Do Not Modify): Observation [104]  PT Acc Code (Do Not Modify): Observation [10022]       B Medical/Surgery History Past Medical History:  Diagnosis Date  . Arthritis   . Chest pain   . Diverticulitis   . Hypertension   . Stroke [facility])    Past Surgical History:  Procedure Laterality Date  . BIOPSY  04/01/2019   Procedure: BIOPSY;  Surgeon: Kathi Der, MD;  Location: [facility] ENDOSCOPY;  Service: Gastroenterology;;  . BIOPSY  10/05/2019   Procedure: BIOPSY;  Surgeon: Kathi Der, MD;  Location: [facility] ENDOSCOPY;  Service: Gastroenterology;;  . COLONOSCOPY WITH PROPOFOL N/A 04/01/2019   Procedure: COLONOSCOPY WITH PROPOFOL;  Surgeon: Kathi Der, MD;  Location: [facility] ENDOSCOPY;  Service: Gastroenterology;  Laterality: N/A;  . COLONOSCOPY WITH PROPOFOL  N/A 10/05/2019   Procedure: COLONOSCOPY WITH PROPOFOL;  Surgeon: Kathi Der, MD;  Location: [facility] ENDOSCOPY;  Service: Gastroenterology;  Laterality: N/A;  . ESOPHAGOGASTRODUODENOSCOPY (EGD) WITH PROPOFOL N/A 04/01/2019   Procedure: ESOPHAGOGASTRODUODENOSCOPY (EGD) WITH PROPOFOL;  Surgeon: Kathi Der, MD;  Location: [facility] ENDOSCOPY;  Service: Gastroenterology;  Laterality: N/A;  . ESOPHAGOGASTRODUODENOSCOPY (EGD) WITH PROPOFOL N/A 10/05/2019   Procedure: ESOPHAGOGASTRODUODENOSCOPY (EGD) WITH PROPOFOL;  Surgeon: Kathi Der, MD;  Location: [facility] ENDOSCOPY;  Service: Gastroenterology;  Laterality: N/A;  . TONSILLECTOMY       A IV Location/Drains/Wounds Patient Lines/Drains/Airways Status   Active Line/Drains/Airways    Name:   Placement date:   Placement time:   Site:   Days:   Peripheral IV 11/07/19 Right;Anterior Forearm   11/07/19    1451    Forearm   1   External Urinary Catheter   10/26/19    2100    -   13          Intake/Output Last 24 hours  Intake/Output Summary (Last 24 hours) at 11/08/2019 0129 Last data filed at 11/07/2019 1024 Gross per 24 hour  Intake 150 ml  Output -  Net 150 ml    Labs/Imaging Results for orders placed or performed during the hospital encounter of 11/07/19 (from the past 48 hour(s))  SARS CORONAVIRUS 2 (TAT 6-24 HRS) Nasopharyngeal Nasopharyngeal Swab     Status: None   Collection Time: 11/07/19 12:14 PM   Specimen: Nasopharyngeal Swab  Result Value Ref Range   SARS Coronavirus 2 NEGATIVE NEGATIVE    Comment: (NOTE) SARS-CoV-2 target nucleic acids are NOT DETECTED. The SARS-CoV-2 RNA  is generally detectable in upper and lower respiratory specimens during the acute phase of infection. Negative results do not preclude SARS-CoV-2 infection, do not rule out co-infections with other pathogens, and should not be used as the sole basis for treatment or other patient management decisions. Negative results must be combined with clinical  observations, patient history, and epidemiological information. The expected result is Negative. Fact Sheet for Patients: SugarRoll.be Fact Sheet for Healthcare Providers: https://www.woods-mathews.com/ This test is not yet approved or cleared by the [place] FDA and  has been authorized for detection and/or diagnosis of SARS-CoV-2 by FDA under an Emergency Use Authorization (EUA). This EUA will remain  in effect (meaning this test can be used) for the duration of the COVID-19 declaration under Section 56 4(b)(1) of the Act, 21 U.S.C. section 360bbb-3(b)(1), unless the authorization is terminated or revoked sooner. Performed at [facility], [place] [address] [place], [place] [address]   CBC with Differential     Status: Abnormal   Collection Time: 11/07/19  2:55 PM  Result Value Ref Range   WBC 5.4 4.0 - 10.5 K/uL   RBC 3.86 (L) 3.87 - 5.11 MIL/uL   Hemoglobin 11.3 (L) 12.0 - 15.0 g/dL   HCT 35.6 (L) 36.0 - 46.0 %   MCV 92.2 80.0 - 100.0 fL   MCH 29.3 26.0 - 34.0 pg   MCHC 31.7 30.0 - 36.0 g/dL   RDW 15.0 11.5 - 15.5 %   Platelets 222 150 - 400 K/uL   nRBC 0.0 0.0 - 0.2 %   Neutrophils Relative % 57 %   Neutro Abs 3.1 1.7 - 7.7 K/uL   Lymphocytes Relative 32 %   Lymphs Abs 1.7 0.7 - 4.0 K/uL   Monocytes Relative 9 %   Monocytes Absolute 0.5 0.1 - 1.0 K/uL   Eosinophils Relative 0 %   Eosinophils Absolute 0.0 0.0 - 0.5 K/uL   Basophils Relative 1 %   Basophils Absolute 0.0 0.0 - 0.1 K/uL   Immature Granulocytes 1 %   Abs Immature Granulocytes 0.03 0.00 - 0.07 K/uL    Comment: Performed at [facility] [address] [place], [place] [address]  Comprehensive metabolic panel     Status: Abnormal   Collection Time: 11/07/19  2:55 PM  Result Value Ref Range   Sodium 136 135 - 145 mmol/L   Potassium 3.7 3.5 - 5.1 mmol/L   Chloride 104 98 - 111 mmol/L   CO2 22 22 - 32 mmol/L   Glucose, Bld 98 70 - 99 mg/dL   BUN 25  (H) 8 - 23 mg/dL   Creatinine, Ser 2.64 (H) 0.44 - 1.00 mg/dL   Calcium 9.8 8.9 - 10.3 mg/dL   Total Protein 6.8 6.5 - 8.1 g/dL   Albumin 3.5 3.5 - 5.0 g/dL   AST 17 15 - 41 U/L   ALT 11 0 - 44 U/L   Alkaline Phosphatase 52 38 - 126 U/L   Total Bilirubin 0.6 0.3 - 1.2 mg/dL   GFR calc non Af Amer 18 (L) >60 mL/min   GFR calc Af Amer 21 (L) >60 mL/min   Anion gap 10 5 - 15    Comment: Performed at [facility] [address], [place], [place] [address]  Type and screen [place]     Status: None   Collection Time: 11/07/19  2:55 PM  Result Value Ref Range   ABO/RH(D) O POS  Antibody Screen NEG    Sample Expiration      11/10/2019,2359 Performed at [facility], [address] [place], [place] [address]   Lipase, blood     Status: None   Collection Time: 11/07/19  2:55 PM  Result Value Ref Range   Lipase 20 11 - 51 U/L    Comment: Performed at [facility], [address] [place], [place] [address]  Urinalysis, Routine w reflex microscopic     Status: Abnormal   Collection Time: 11/07/19  6:00 PM  Result Value Ref Range   Color, Urine AMBER (A) YELLOW    Comment: BIOCHEMICALS MAY BE AFFECTED BY COLOR   APPearance CLOUDY (A) CLEAR   Specific Gravity, Urine 1.015 1.005 - 1.030   pH 6.0 5.0 - 8.0   Glucose, UA NEGATIVE NEGATIVE mg/dL   Hgb urine dipstick NEGATIVE NEGATIVE   Bilirubin Urine NEGATIVE NEGATIVE   Ketones, ur NEGATIVE NEGATIVE mg/dL   Protein, ur 30 (A) NEGATIVE mg/dL   Nitrite NEGATIVE NEGATIVE   Leukocytes,Ua TRACE (A) NEGATIVE   RBC / HPF 0-5 0 - 5 RBC/hpf   WBC, UA 6-10 0 - 5 WBC/hpf   Bacteria, UA MANY (A) NONE SEEN   Squamous Epithelial / LPF 0-5 0 - 5    Comment: Performed at [facility], [address] [place], [place] [address]   DG Chest Port 1 View  Result Date: 11/07/2019 CLINICAL DATA:  Weakness EXAM: PORTABLE CHEST 1 VIEW COMPARISON:  06/16/2019 FINDINGS: Borderline heart size. Aortic tortuosity.  There is no edema, consolidation, effusion, or pneumothorax. IMPRESSION: No evidence of acute disease. Electronically Signed   By: Marnee Spring M.D.   On: 11/07/2019 11:55    Pending Labs Unresulted Labs (From admission, onward)    Start     Ordered   11/15/19 0500  Creatinine, serum  (enoxaparin (LOVENOX)    CrCl >/= 30 ml/min)  Weekly,   R    Comments: while on enoxaparin therapy   Question:  Specimen collection method  Answer:  IV Team=IV Team collect   11/08/19 0059   11/08/19 0500  Basic metabolic panel  Tomorrow morning,   R    Question:  Specimen collection method  Answer:  IV Team=IV Team collect   11/08/19 0059   11/08/19 0500  CBC  Tomorrow morning,   R    Question:  Specimen collection method  Answer:  IV Team=IV Team collect   11/08/19 0059   11/08/19 0100  CBC  (enoxaparin (LOVENOX)    CrCl >/= 30 ml/min)  Once,   STAT    Comments: Baseline for enoxaparin therapy IF NOT ALREADY DRAWN.  Notify MD if PLT < 100 K.   Question:  Specimen collection method  Answer:  IV Team=IV Team collect   11/08/19 0059   11/08/19 0100  Creatinine, serum  (enoxaparin (LOVENOX)    CrCl >/= 30 ml/min)  Once,   STAT    Comments: Baseline for enoxaparin therapy IF NOT ALREADY DRAWN.   Question:  Specimen collection method  Answer:  IV Team=IV Team collect   11/08/19 0059   11/07/19 1949  Urine culture  ONCE - STAT,   STAT     11/07/19 1948          Vitals/Pain Today's Vitals   11/07/19 2212 11/07/19 2215 11/07/19 2245 11/07/19 2348  BP:  109/67  (!) 102/58  Pulse:  64 66 64  Resp:   15 20  Temp:      TempSrc:      SpO2:  100% 100% 100%  PainSc: 0-No pain       Isolation Precautions No active isolations  Medications Medications  aspirin EC tablet 81 mg (has no administration in time range)  HYDROcodone-acetaminophen (NORCO/VICODIN) 5-325 MG per tablet 1 tablet (has no administration in time range)  hydroxychloroquine (PLAQUENIL) tablet 200 mg (has no administration in time  range)  megestrol (MEGACE) 400 MG/10ML suspension 400 mg (has no administration in time range)  colestipol (COLESTID) tablet 1 g (has no administration in time range)  metoprolol tartrate (LOPRESSOR) tablet 100 mg (has no administration in time range)  DULoxetine (CYMBALTA) DR capsule 60 mg (has no administration in time range)  docusate sodium (COLACE) capsule 100 mg (has no administration in time range)  pantoprazole (PROTONIX) EC tablet 40 mg (has no administration in time range)  ondansetron (ZOFRAN) tablet 4 mg (has no administration in time range)  mirabegron ER (MYRBETRIQ) tablet 25 mg (has no administration in time range)  clopidogrel (PLAVIX) tablet 75 mg (has no administration in time range)  tiZANidine (ZANAFLEX) tablet 4 mg (has no administration in time range)  feeding supplement (ENSURE ENLIVE) (ENSURE ENLIVE) liquid 237 mL (has no administration in time range)  multivitamin with minerals tablet 1 tablet (has no administration in time range)  enoxaparin (LOVENOX) injection 40 mg (has no administration in time range)  acetaminophen (TYLENOL) tablet 650 mg (has no administration in time range)    Or  acetaminophen (TYLENOL) suppository 650 mg (has no administration in time range)  senna-docusate (Senokot-S) tablet 1 tablet (has no administration in time range)  ondansetron (ZOFRAN) tablet 4 mg (has no administration in time range)    Or  ondansetron (ZOFRAN) injection 4 mg (has no administration in time range)  cefTRIAXone (ROCEPHIN) 1 g in sodium chloride 0.9 % 100 mL IVPB (has no administration in time range)  0.9 %  sodium chloride infusion ( Intravenous Stopped 11/07/19 1519)  alum & mag hydroxide-simeth (MAALOX/MYLANTA) 200-200-20 MG/5ML suspension 30 mL (30 mLs Oral Given 11/07/19 1231)  sodium chloride 0.9 % bolus 500 mL (0 mLs Intravenous Stopped 11/07/19 1650)  cefTRIAXone (ROCEPHIN) 1 g in sodium chloride 0.9 % 100 mL IVPB (0 g Intravenous Stopped 11/07/19 2137)     Mobility walks High fall risk   Focused Assessments Neuro Assessment Handoff:  Swallow screen pass? Yes  Cardiac Rhythm: Normal sinus rhythm       Neuro Assessment:   Neuro Checks:    , Renal Assessment Handoff:       R Recommendations: See Admitting Provider Note  Report given to:   Additional Notes: N/A

## 2019-11-08 NOTE — Evaluation (Signed)
Occupational Therapy Evaluation Patient Details Name: Michelle Graves MRN: [id number] DOB: 07/03/1951 Today's Date: 11/08/2019    History of Present Illness 69 yo admitted with weakness, AMS and UTI with AKI superimposed on CKD. PMHx: ischemic stroke, RA, colitis   Clinical Impression   Patient is a 69 year old female that lives alone in an apartment with elevator access. Patient is modified independent at baseline, uses walker for ambulation. Currently patient requires cues for safety awareness and appropriate use of walker during ambulation, transfers. Patient is min A for functional transfers and self care for safety due to impaired cognition, does not require extensive physical assistance. If patient cognition improve with treatment of UTI this therapist feels patient would be appropriate for discharge home with home health services for further fall prevention training in the home. Will continue to follow with acute OT for further eduction IH:[id number] awareness/cognition, if does not improve once medically stable patient will require 24/7 supervision at discharge.     Follow Up Recommendations  Home health OT;Other (comment)(if cognition does not improve with treatment rec 24/7 SUP)    Equipment Recommendations  None recommended by OT       Precautions / Restrictions Precautions Precautions: Fall Restrictions Weight Bearing Restrictions: No      Mobility Bed Mobility Overal bed mobility: Needs Assistance Bed Mobility: Supine to Sit;Sit to Supine     Supine to sit: Supervision;HOB elevated Sit to supine: Supervision   General bed mobility comments: pt initiates sitting up in bed d/t need to use bathroom  Transfers Overall transfer level: Needs assistance Equipment used: Rolling walker (2 wheeled) Transfers: Sit to/from Stand Sit to Stand: Min assist         General transfer comment: verbal cues for safety with body mechanics    Balance Overall balance assessment:  Needs assistance;History of Falls Sitting-balance support: Feet supported;No upper extremity supported Sitting balance-[name]: Good     Standing balance support: During functional activity;Bilateral upper extremity supported Standing balance-[name]: Poor Standing balance comment: min A for safety                           ADL either performed or assessed with clinical judgement   ADL Overall ADL's : Needs assistance/impaired Eating/Feeding: Independent;Sitting   Grooming: Wash/dry face;Wash/dry hands;Min guard;Standing Grooming Details (indicate cue type and reason): verbal cue to bring walker with her when leaving sink Upper Body Bathing: Sitting;Set up   Lower Body Bathing: Minimal assistance;Sitting/lateral leans   Upper Body Dressing : Sitting;Set up   Lower Body Dressing: Minimal assistance;Sitting/lateral leans   Toilet Transfer: Minimal assistance;RW;Regular Toilet;Ambulation;Cueing for sequencing;Cueing for safety Toilet Transfer Details (indicate cue type and reason): decreased safety awareness pushing walker off to the side before fully turned around, cue patient to hold grab bar vs holding onto side of walker when sitting onto toilet Toileting- Clothing Manipulation and Hygiene: Minimal assistance;Sit to/from stand Toileting - Clothing Manipulation Details (indicate cue type and reason): min A for safety in standing to complete peri care after voiding     Functional mobility during ADLs: Minimal assistance;Rolling walker;Cueing for safety;Cueing for sequencing General ADL Comments: pt demonstrates physical ability to participate in self care, impaired cognition poses safety risk during self care tasks requiring verbal cues                   Pertinent Vitals/Pain Pain Assessment: Faces Faces Pain Scale: No hurt     Hand Dominance  Left   Extremity/Trunk Assessment Upper Extremity Assessment Upper Extremity Assessment: Generalized  weakness   Lower Extremity Assessment Lower Extremity Assessment: Defer to PT evaluation   Cervical / Trunk Assessment Cervical / Trunk Assessment: Normal   Communication Communication Communication: No difficulties   Cognition Arousal/Alertness: Awake/alert Behavior During Therapy: WFL for tasks assessed/performed Overall Cognitive Status: Impaired/Different from baseline Area of Impairment: Orientation;Following commands;Safety/judgement;Problem solving                 Orientation Level: Disoriented to;Place;Situation;Time(patient knew year, not month)     Following Commands: Follows one step commands inconsistently Safety/Judgement: Decreased awareness of safety   Problem Solving: Difficulty sequencing;Requires verbal cues;Requires tactile cues General Comments: decreased safety awareness, pushing walker off to the side and forgetting to use it when ambulating from sink back to bed              Home Living Family/patient expects to be discharged to:: Private residence Living Arrangements: Alone Available Help at Discharge: Family Type of Home: Apartment Home Access: Ramped entrance Entrance Stairs-Number of Steps: pt reports that her apartment building has a level entry and that takes elevator to her 5th floor apartment   Home Layout: One level     Bathroom Shower/Tub: Chief Strategy Officer: Standard Bathroom Accessibility: Yes How Accessible: Accessible via walker Home Equipment: Walker - 2 wheels;Walker - 4 wheels;Cane - quad;Shower seat   Additional Comments: Patients daughter lives around the corner       Prior Functioning/Environment Level of Independence: Independent with assistive device(s)        Comments: reports that she uses a walker        OT Problem List: Decreased strength;Decreased activity tolerance;Impaired balance (sitting and/or standing);Decreased safety awareness;Decreased knowledge of use of DME or AE;Decreased  cognition      OT Treatment/Interventions: Self-care/ADL training;Therapeutic exercise;DME and/or AE instruction;Therapeutic activities;Patient/family education;Balance training    OT Goals(Current goals can be found in the care plan section) Acute Rehab OT Goals Patient Stated Goal: to go home OT Goal Formulation: With patient Time For Goal Achievement: 11/22/19 Potential to Achieve Goals: Good  OT Frequency: Min 2X/week    AM-PAC OT "6 Clicks" Daily Activity     Outcome Measure Help from another person eating meals?: None Help from another person taking care of personal grooming?: A Little Help from another person toileting, which includes using toliet, bedpan, or urinal?: A Little Help from another person bathing (including washing, rinsing, drying)?: A Little Help from another person to put on and taking off regular upper body clothing?: A Little Help from another person to put on and taking off regular lower body clothing?: A Little 6 Click Score: 19   End of Session Equipment Utilized During Treatment: Rolling walker Nurse Communication: Mobility status  Activity Tolerance: Patient tolerated treatment well Patient left: in bed;with call bell/phone within reach;with bed alarm set  OT Visit Diagnosis: Unsteadiness on feet (R26.81);Other abnormalities of gait and mobility (R26.89);Muscle weakness (generalized) (M62.81);History of falling (Z91.81);Other symptoms and signs involving cognitive function                Time: 9449-6759 OT Time Calculation (min): 21 min Charges:  OT General Charges $OT Visit: 1 Visit OT Evaluation $OT Eval Moderate Complexity: 1 Mod  Myrtie Neither OT OT office: [phone]  Carmelia Roller 11/08/2019, 11:35 AM

## 2019-11-08 NOTE — Progress Notes (Signed)
[facility] notified that patient is confused and made many attempts to get up unassisted request order for Tele Sister. Ilean Skill LPN

## 2019-11-08 NOTE — TOC Initial Note (Signed)
Transition of Care [facility]) - Initial/Assessment Note    Patient Details  Name: Michelle Graves MRN: [id number] Date of Birth: [birth year]  Transition of Care [facility]) CM/SW Contact:    Marilu Favre, RN Phone Number: 11/08/2019, 12:45 PM  Clinical Narrative:                  Spoke to patient and daughter at bedside. Patient drowsy at present. Confirmed face sheet information with daughter. Patient has walker and 3 in 1 at home , lives alone but daughter lives close by.    Patient has home health with [facility]. Called Cory with [facility] and left message.    Daughter requesting PT/OT eval , secure chatted MD.        Patient Goals and CMS Choice Patient states their goals for this hospitalization and ongoing recovery are:: to go home CMS Medicare.gov Compare Post Acute Care list provided to:: Patient Choice offered to / list presented to : Patient  Expected Discharge Plan and Services     Discharge Planning Services: CM Consult   Living arrangements for the past 2 months: Apartment Expected Discharge Date: 11/10/19                                    Prior Living Arrangements/Services Living arrangements for the past 2 months: Apartment Lives with:: Self Patient language and need for interpreter reviewed:: Yes        Need for Family Participation in Patient Care: Yes (Comment) Care giver support system in place?: Yes (comment)   Criminal Activity/Legal Involvement Pertinent to Current Situation/Hospitalization: No - Comment as needed  Activities of Daily Living Home Assistive Devices/Equipment: Blood pressure cuff, Dentures (specify type), Cane (specify quad or straight), Walker (specify type), Grab bars in shower, Shower chair with back, Bedside commode/3-in-1, Wheelchair ADL Screening (condition at time of admission) Patient's cognitive ability adequate to safely complete daily activities?: Yes Is the patient deaf or have difficulty hearing?: No Does the  patient have difficulty seeing, even when wearing glasses/contacts?: No Does the patient have difficulty concentrating, remembering, or making decisions?: Yes Patient able to express need for assistance with ADLs?: Yes Does the patient have difficulty dressing or bathing?: No Independently performs ADLs?: Yes (appropriate for developmental age) Communication: Independent Dressing (OT): Independent Is this a change from baseline?: Pre-admission baseline Grooming: Independent Is this a change from baseline?: Pre-admission baseline Feeding: Independent Bathing: Needs assistance Is this a change from baseline?: Pre-admission baseline Toileting: Needs assistance Is this a change from baseline?: Pre-admission baseline In/Out Bed: Needs assistance Is this a change from baseline?: Pre-admission baseline Walks in Home: Needs assistance Does the patient have difficulty walking or climbing stairs?: Yes Weakness of Legs: Both Weakness of Arms/Hands: None  Permission Sought/Granted   Permission granted to share information with : Yes, Verbal Permission Granted  Share Information with NAME: daughter [name]           Emotional Assessment Appearance:: Appears stated age Attitude/Demeanor/Rapport: Other (comment)(drowsy)     Alcohol / Substance Use: Illicit Drugs    Admission diagnosis:  UTI (urinary tract infection) [N39.0] Weakness [R53.1] AKI (acute kidney injury) ([place]) [N17.9] Urinary tract infection without hematuria, site unspecified [N39.0] Hypotension [I95.9] Patient Active Problem List   Diagnosis Date Noted  . UTI (urinary tract infection) 11/08/2019  . Urinary retention 10/28/2019  . Acute on chronic kidney failure ([place]) 10/27/2019  . Malnutrition of moderate degree  10/03/2019  . Colitis 09/30/2019  . AKI (acute kidney injury) ([facility]) 05/28/2019  . Nausea & vomiting 05/28/2019  . 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 Anemia, chronic disease 05/28/2019  . Swelling   . Small  vessel disease, cerebrovascular   . Acute ischemic stroke ([facility])   . Generalized weakness   . Acute left ankle pain   . Acute on chronic renal failure ([facility]) 05/13/2019  . Weakness of both lower extremities 05/13/2019  . Cerebral thrombosis with cerebral infarction 03/31/2019  . New onset a-fib ([facility]) 03/27/2019  . Acute kidney injury superimposed on CKD ([facility]) 03/24/2019  . Hypotension 03/24/2019  . Dark emesis 03/24/2019  . Dark stools 03/24/2019  . Diverticulitis of intestine without perforation or abscess without bleeding   . Sinus tachycardia 08/27/2016  . Diverticulitis 08/26/2016  . HYPERTENSION, MALIGNANT ESSENTIAL 06/24/2007  . KIDNEY DISEASE, CHRONIC, STAGE III 06/24/2007  . ARTHRITIS, RHEUMATOID, SEROPOSITIVE 06/24/2007   PCP:  [facility]:   [id number] - [name], [place] - [address] AT [facility] [address] [place] [address] Phone: [phone] Fax: [phone]     Social Determinants of Health (SDOH) Interventions    Readmission Risk Interventions Readmission Risk Prevention Plan 10/28/2019 10/05/2019 05/31/2019  Transportation Screening Complete Complete Complete  PCP or Specialist Appt within 3-5 Days - Not Complete Not Complete  Not Complete comments - - plan for SNF  HRI or Home Care Consult - Complete Not Complete  HRI or Home Care Consult comments - - plan for SNF  Social Work Consult for Recovery Care Planning/Counseling - Complete Complete  Palliative Care Screening - Not Applicable Not Applicable  Medication Review (RN Care Manager) Referral to Pharmacy Complete Complete  PCP or Specialist appointment within 3-5 days of discharge Not Complete - -  PCP/Specialist Appt Not Complete comments due to holiday, pt to make appointment - -  HRI or Home Care Consult Complete - -  SW Recovery Care/Counseling Consult Complete - -  Palliative Care Screening Not  Applicable - -  Skilled Nursing Facility Complete - -  Some recent data might be hidden

## 2019-11-08 NOTE — Consult Note (Signed)
[facility] Face-to-Face Psychiatry Consult   Reason for Consult:  Depression  Referring Physician:  Dr Vedia Coffer Patient Identification: Michelle Graves MRN:  [id number] Principal Diagnosis: UTI (urinary tract infection) Diagnosis:  Principal Problem:   UTI (urinary tract infection) Active Problems:   Major depressive disorder, recurrent episode, moderate (HCC)   Hypotension   Generalized weakness   Anemia, chronic disease   Acute on chronic kidney failure (HCC)   Total Time spent with patient: 1 hour  Subjective:   Michelle Graves is a 69 y.o. female patient admitted with UTI and weakness, consult for depression medications.  Patient seen and evaluated in person by this provider.  She does endorse depression and agreeable to starting antidepressant.  Medications were reviewed as she had stopped taking them based on the fact that she was experiencing urinary retention.  This was most likely facilitated by her doxepin for insomnia versus Cymbalta.  Cymbalta 60 mg recommended along with insomnia medications not associated with urinary retention.  Denies suicidal/homicidal ideations, hallucinations, and subs abuse.  Her appetite is poor to fair at this time and she still complains of being fatigued.  No other concerns.  HPI per MD:  Michelle Graves is a 69 y.o. 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  Patient has had two previous presentations with similar symptom.  Patient reports presenting to the ER today due to weakness.  She reports she was brought in by a friend but for the past day she has been feeling really tired.  She does not complain of any focal deficits but states in the past couple of weeks she has had 5 or 6 falls.  She states she will be using her walker/rollator and dip down, describing mechanical in nature.  She denies ever having hitting her head.  Today she reports  more lightheadedness/weakness.  Over the past week she has not been very ambulatory.  It is unclear if she took her blood pressure medications correctly.  She denies any fevers, chills, cough, nausea/vomiting, diarrhea.  She denies any rashes but points to bruise on her RLE from one of her falls this past week.  She does report she has been urinating more frequently and states she was recently treated for a UTI with similar symptoms.  She lives independently but reports that her daughter lives nearby.  Past Psychiatric History:  Depression, insomnia  Risk to Self:  none Risk to Others:  none Prior Inpatient Therapy:  none Prior Outpatient Therapy:  none  Past Medical History:  Past Medical History:  Diagnosis Date  . Acute on chronic kidney failure (HCC)   . Anemia   . Arthritis   . Chest pain   . Diverticulitis   . Hypertension   . Stroke (HCC)   . UTI (urinary tract infection) 10/2019    Past Surgical History:  Procedure Laterality Date  . BIOPSY  04/01/2019   Procedure: BIOPSY;  Surgeon: Kathi Der, MD;  Location: [facility] ENDOSCOPY;  Service: Gastroenterology;;  . BIOPSY  10/05/2019   Procedure: BIOPSY;  Surgeon: Kathi Der, MD;  Location: [facility] ENDOSCOPY;  Service: Gastroenterology;;  . COLONOSCOPY WITH PROPOFOL N/A 04/01/2019   Procedure: COLONOSCOPY WITH PROPOFOL;  Surgeon: Kathi Der, MD;  Location: [facility] ENDOSCOPY;  Service: Gastroenterology;  Laterality: N/A;  . COLONOSCOPY WITH PROPOFOL N/A 10/05/2019   Procedure: COLONOSCOPY WITH PROPOFOL;  Surgeon: Kathi Der, MD;  Location: [facility] ENDOSCOPY;  Service: Gastroenterology;  Laterality: N/A;  . ESOPHAGOGASTRODUODENOSCOPY (EGD) WITH PROPOFOL N/A 04/01/2019   Procedure: ESOPHAGOGASTRODUODENOSCOPY (EGD) WITH PROPOFOL;  Surgeon: Kathi Der, MD;  Location: [facility] ENDOSCOPY;  Service: Gastroenterology;  Laterality: N/A;  . ESOPHAGOGASTRODUODENOSCOPY (EGD) WITH PROPOFOL N/A 10/05/2019   Procedure:  ESOPHAGOGASTRODUODENOSCOPY (EGD) WITH PROPOFOL;  Surgeon: Kathi Der, MD;  Location: [facility] ENDOSCOPY;  Service: Gastroenterology;  Laterality: N/A;  . TONSILLECTOMY     Family History:  Family History  Problem Relation Age of Onset  . Hypertension Mother   . Diabetes Mother   . CAD Father        died of MI at age 68  . Hypertension Father   . Diabetes Sister   . Diabetes Sister   . Kidney disease Neg Hx    Family Psychiatric  History: none Social History:  Social History   Substance and Sexual Activity  Alcohol Use Yes  . Alcohol/week: 21.0 standard drinks  . Types: 21 Glasses of wine per week   Comment: sometimes      Social History   Substance and Sexual Activity  Drug Use No    Social History   Socioeconomic History  . Marital status: Divorced    Spouse name: Not on file  . Number of children: Not on file  . Years of education: Not on file  . Highest education level: Not on file  Occupational History  . Not on file  Tobacco Use  . Smoking status: Never Smoker  . Smokeless tobacco: Never Used  Substance and Sexual Activity  . Alcohol use: Yes    Alcohol/week: 21.0 standard drinks    Types: 21 Glasses of wine per week    Comment: sometimes   . Drug use: No  . Sexual activity: Yes    Partners: Male    Birth control/protection: Condom  Other Topics Concern  . Not on file  Social History Narrative  . Not on file   Social Determinants of Health   Financial Resource Strain:   . Difficulty of Paying Living Expenses: Not on file  Food Insecurity:   . Worried About Programme researcher, broadcasting/film/video in the Last Year: Not on file  . Ran Out of Food in the Last Year: Not on file  Transportation Needs:   . Lack of Transportation (Medical): Not on file  . Lack of Transportation (Non-Medical): Not on file  Physical Activity:   . Days of Exercise per Week: Not on file  . Minutes of Exercise per Session: Not on file  Stress:   . Feeling of Stress : Not on file  Social  Connections:   . Frequency of Communication with Friends and Family: Not on file  . Frequency of Social Gatherings with Friends and Family: Not on file  . Attends Religious Services: Not on file  . Active Member of Clubs or Organizations: Not on file  . Attends Banker Meetings: Not on file  . Marital Status: Not on file   Additional Social History:    Allergies:  No Known Allergies  Labs:  Results for orders placed or performed during the hospital encounter of 11/07/19 (from the past 48 hour(s))  SARS CORONAVIRUS 2 (TAT 6-24 HRS) Nasopharyngeal Nasopharyngeal Swab     Status: None   Collection Time: 11/07/19 12:14 PM   Specimen: Nasopharyngeal Swab  Result Value Ref Range   SARS Coronavirus 2 NEGATIVE NEGATIVE    Comment: (NOTE) SARS-CoV-2 target nucleic acids are NOT DETECTED. The SARS-CoV-2 RNA is generally detectable in upper  and lower respiratory specimens during the acute phase of infection. Negative results do not preclude SARS-CoV-2 infection, do not rule out co-infections with other pathogens, and should not be used as the sole basis for treatment or other patient management decisions. Negative results must be combined with clinical observations, patient history, and epidemiological information. The expected result is Negative. Fact Sheet for Patients: SugarRoll.be Fact Sheet for Healthcare Providers: https://www.woods-mathews.com/ This test is not yet approved or cleared by the [place] FDA and  has been authorized for detection and/or diagnosis of SARS-CoV-2 by FDA under an Emergency Use Authorization (EUA). This EUA will remain  in effect (meaning this test can be used) for the duration of the COVID-19 declaration under Section 56 4(b)(1) of the Act, 21 U.S.C. section 360bbb-3(b)(1), unless the authorization is terminated or revoked sooner. Performed at [facility], [place] [address] [place],  [place] [address]   CBC with Differential     Status: Abnormal   Collection Time: 11/07/19  2:55 PM  Result Value Ref Range   WBC 5.4 4.0 - 10.5 K/uL   RBC 3.86 (L) 3.87 - 5.11 MIL/uL   Hemoglobin 11.3 (L) 12.0 - 15.0 g/dL   HCT 35.6 (L) 36.0 - 46.0 %   MCV 92.2 80.0 - 100.0 fL   MCH 29.3 26.0 - 34.0 pg   MCHC 31.7 30.0 - 36.0 g/dL   RDW 15.0 11.5 - 15.5 %   Platelets 222 150 - 400 K/uL   nRBC 0.0 0.0 - 0.2 %   Neutrophils Relative % 57 %   Neutro Abs 3.1 1.7 - 7.7 K/uL   Lymphocytes Relative 32 %   Lymphs Abs 1.7 0.7 - 4.0 K/uL   Monocytes Relative 9 %   Monocytes Absolute 0.5 0.1 - 1.0 K/uL   Eosinophils Relative 0 %   Eosinophils Absolute 0.0 0.0 - 0.5 K/uL   Basophils Relative 1 %   Basophils Absolute 0.0 0.0 - 0.1 K/uL   Immature Granulocytes 1 %   Abs Immature Granulocytes 0.03 0.00 - 0.07 K/uL    Comment: Performed at [facility] [address] [place], [place] [address]  Comprehensive metabolic panel     Status: Abnormal   Collection Time: 11/07/19  2:55 PM  Result Value Ref Range   Sodium 136 135 - 145 mmol/L   Potassium 3.7 3.5 - 5.1 mmol/L   Chloride 104 98 - 111 mmol/L   CO2 22 22 - 32 mmol/L   Glucose, Bld 98 70 - 99 mg/dL   BUN 25 (H) 8 - 23 mg/dL   Creatinine, Ser 2.64 (H) 0.44 - 1.00 mg/dL   Calcium 9.8 8.9 - 10.3 mg/dL   Total Protein 6.8 6.5 - 8.1 g/dL   Albumin 3.5 3.5 - 5.0 g/dL   AST 17 15 - 41 U/L   ALT 11 0 - 44 U/L   Alkaline Phosphatase 52 38 - 126 U/L   Total Bilirubin 0.6 0.3 - 1.2 mg/dL   GFR calc non Af Amer 18 (L) >60 mL/min   GFR calc Af Amer 21 (L) >60 mL/min   Anion gap 10 5 - 15    Comment: Performed at [facility] [address], [place], [place] [address]  Type and screen [place]     Status: None   Collection Time: 11/07/19  2:55 PM  Result Value Ref Range   ABO/RH(D) O POS    Antibody Screen NEG  Sample Expiration      11/10/2019,2359 Performed at [facility], [address]  [place], [place] [address]   Lipase, blood     Status: None   Collection Time: 11/07/19  2:55 PM  Result Value Ref Range   Lipase 20 11 - 51 U/L    Comment: Performed at [facility], [address] [place], [place] [address]  Urinalysis, Routine w reflex microscopic     Status: Abnormal   Collection Time: 11/07/19  6:00 PM  Result Value Ref Range   Color, Urine AMBER (A) YELLOW    Comment: BIOCHEMICALS MAY BE AFFECTED BY COLOR   APPearance CLOUDY (A) CLEAR   Specific Gravity, Urine 1.015 1.005 - 1.030   pH 6.0 5.0 - 8.0   Glucose, UA NEGATIVE NEGATIVE mg/dL   Hgb urine dipstick NEGATIVE NEGATIVE   Bilirubin Urine NEGATIVE NEGATIVE   Ketones, ur NEGATIVE NEGATIVE mg/dL   Protein, ur 30 (A) NEGATIVE mg/dL   Nitrite NEGATIVE NEGATIVE   Leukocytes,Ua TRACE (A) NEGATIVE   RBC / HPF 0-5 0 - 5 RBC/hpf   WBC, UA 6-10 0 - 5 WBC/hpf   Bacteria, UA MANY (A) NONE SEEN   Squamous Epithelial / LPF 0-5 0 - 5    Comment: Performed at [facility], [address] [place], [place] [address]    Current Facility-Administered Medications  Medication Dose Route Frequency Provider Last Rate Last Admin  . 0.9 %  sodium chloride infusion   Intravenous Continuous Gwenyth Bender, NP 50 mL/hr at 11/08/19 1500 Rate Verify at 11/08/19 1500  . acetaminophen (TYLENOL) tablet 650 mg  650 mg Oral Q6H PRN [name], MD       Or  . acetaminophen (TYLENOL) suppository 650 mg  650 mg Rectal Q6H PRN [name], MD      . aspirin EC tablet 81 mg  81 mg Oral Daily [name], MD   81 mg at 11/08/19 0953  . cefTRIAXone (ROCEPHIN) 1 g in sodium chloride 0.9 % 100 mL IVPB  1 g Intravenous Q24H [name], MD      . clopidogrel (PLAVIX) tablet 75 mg  75 mg Oral Daily [name], MD   75 mg at 11/08/19 0953  . colestipol (COLESTID) tablet 1 g  1 g Oral BID [name], MD   1 g at 11/08/19 0953  . docusate sodium (COLACE) capsule 100 mg  100 mg Oral BID [name], MD    100 mg at 11/08/19 0953  . DULoxetine (CYMBALTA) DR capsule 60 mg  60 mg Oral QHS [name], MD      . enoxaparin (LOVENOX) injection 30 mg  30 mg Subcutaneous Q24H [name], MD   30 mg at 11/08/19 1441  . feeding supplement (ENSURE ENLIVE) (ENSURE ENLIVE) liquid 237 mL  237 mL Oral TID BM Roberto Scales D, MD   237 mL at 11/08/19 1441  . HYDROcodone-acetaminophen (NORCO/VICODIN) 5-325 MG per tablet 1 tablet  1 tablet Oral TID PRN [name], MD   1 tablet at 11/08/19 0247  . hydroxychloroquine (PLAQUENIL) tablet 200 mg  200 mg Oral BID [name], MD   200 mg at 11/08/19 3254  . megestrol (MEGACE) 400 MG/10ML suspension 400 mg  400 mg Oral BID [name], MD   400 mg at 11/08/19 0953  . metoprolol tartrate (LOPRESSOR) tablet 100 mg  100 mg Oral Daily [name], MD   100 mg at 11/08/19 0952  .  mirabegron ER (MYRBETRIQ) tablet 25 mg  25 mg Oral Daily [name], MD   25 mg at 11/08/19 0952  . multivitamin with minerals tablet 1 tablet  1 tablet Oral Daily [name], MD   1 tablet at 11/08/19 [phone]  . ondansetron (ZOFRAN) injection 4 mg  4 mg Intravenous Q6H PRN [name], MD      . ondansetron [facility]) tablet 4 mg  4 mg Oral Q6H PRN [name], MD      . pantoprazole (PROTONIX) EC tablet 40 mg  40 mg Oral Daily [name], MD   40 mg at 11/08/19 0953  . senna-docusate (Senokot-S) tablet 1 tablet  1 tablet Oral QHS PRN [name], MD      . tiZANidine (ZANAFLEX) tablet 4 mg  4 mg Oral BID [name], MD   4 mg at 11/08/19 5409    Musculoskeletal: Strength & Muscle Tone: decreased Gait & Station: did not witness Patient leans: N/A  Psychiatric Specialty Exam: Physical Exam  Nursing note and vitals reviewed. Constitutional: She is oriented to person, place, and time. She appears well-developed and well-nourished.  HENT:  Head: Normocephalic.  Respiratory: Effort normal.  Musculoskeletal:        General:  Normal range of motion.     Cervical back: Normal range of motion.  Neurological: She is alert and oriented to person, place, and time.  Psychiatric: Her speech is normal. Judgment and thought content normal. She is slowed. Cognition and memory are normal. She exhibits a depressed mood.    Review of Systems  Constitutional: Positive for fatigue.  Neurological: Positive for weakness.  Psychiatric/Behavioral: Positive for dysphoric mood.  All other systems reviewed and are negative.   Blood pressure 120/83, pulse 70, temperature (!) 97.4 F (36.3 C), temperature source Oral, resp. rate 18, SpO2 100 %.There is no height or weight on file to calculate BMI.  General Appearance: Casual  Eye Contact:  Fair  Speech:  Normal Rate  Volume:  Decreased  Mood:  Depressed  Affect:  Congruent  Thought Process:  Coherent and Descriptions of Associations: Intact  Orientation:  Full (Time, Place, and Person)  Thought Content:  WDL and Logical  Suicidal Thoughts:  No  Homicidal Thoughts:  No  Memory:  Immediate;   Good Recent;   Good Remote;   Good  Judgement:  Fair  Insight:  Fair  Psychomotor Activity:  Decreased  Concentration:  Concentration: Fair and Attention Span: Fair  Recall:  Good  Fund of Knowledge:  Good  Language:  Good  Akathisia:  No  Handed:  Right  AIMS (if indicated):     Assets:  Leisure Time Resilience Social Support  ADL's:  Intact  Cognition:  WNL  Sleep:      69 year old female admitted for urinary tract infection, consult placed for depression medication recommendations.  Low level of depression with history of depression and insomnia.  Suicidal/homicidal ideations, hallucinations, or subs abuse.  Recommendations below.  Treatment Plan Summary: Major depressive disorder, recurrent, moderate: -Continue Cymbalta 60 mg daily  Insomnia: -Discontinue home medication of Doxepine -Recommend gabapentin 100 mg at bedtime PRN or acetaminophen 500 mg at bedtime  PRN  Disposition: No evidence of imminent risk to self or others at present.   Patient does not meet criteria for psychiatric inpatient admission.  Nanine Means, NP 11/08/2019 4:18 PM

## 2019-11-08 NOTE — Progress Notes (Addendum)
[facility] PROGRESS NOTE  Michelle Graves NLG:[id number] DOB: Dec 05, 1950 DOA: 11/07/2019 PCP: [facility]  Assessment/Plan:  #1.  Urinary tract infection.  Patient is afebrile, hemodynamically stable nontoxic-appearing.. Rocephin initiated in the emergency department. -Urine culture -Continue Rocephin  #2.  Hypotension.  Likely related to above.  She has a history of hypertension.  Home medications include amlodipine, clonidine, HCTZ, lisinopril, metoprolol. Fair control -continue metoprolol -Monitor  #3.  Acute kidney injury superimposed on chronic kidney disease stage III.  Likely related to hypotension in the setting of decreased oral intake.  She was provided with gentle IV fluids.  Creatinine is 2. 6 4 which appears to be a little above baseline. -Hold nephrotoxins -Monitor urine output -Recheck in the a.m.  #4.  History of CVA.Marland Kitchen Home meds do not include a statin at this time. -Continue home aspirin Plavix.  #5.  Generalized weakness.  Likely related to above.  No changes in neuro exam. Slight improvement with IV fluids. Of note, last admission snf recommended and patient declined.  -PT/OT  Code Status: full Family Communication:  Disposition Plan: home when ready   Consultants:    Procedures:    Antibiotics:  rocephin  HPI/Subjective: Denies pain/discomfort  Objective: Vitals:   11/08/19 0214 11/08/19 0507  BP: 125/71 106/77  Pulse: 64 66  Resp: 18 18  Temp: (!) 97.5 F (36.4 C) (!) 97.4 F (36.3 C)  SpO2: 100% 100%    Intake/Output Summary (Last 24 hours) at 11/08/2019 0914 Last data filed at 11/08/2019 0700 Gross per 24 hour  Intake 390 ml  Output --  Net 390 ml   There were no vitals filed for this visit.  Exam:   General:  Awake alert no acute distress  Cardiovascular: rrr no mgr no LE edema  Respiratory: Normal effort breath sounds clear I hear no wheezing or crackles  Abdomen: Soft  nondistended positive bowel sounds throughout no guarding or rebounding  Musculoskeletal: Joints without swelling/erythema  Data Reviewed: Basic Metabolic Panel: Recent Labs  Lab 11/07/19 1455  NA 136  K 3.7  CL 104  CO2 22  GLUCOSE 98  BUN 25*  CREATININE 2.64*  CALCIUM 9.8   Liver Function Tests: Recent Labs  Lab 11/07/19 1455  AST 17  ALT 11  ALKPHOS 52  BILITOT 0.6  PROT 6.8  ALBUMIN 3.5   Recent Labs  Lab 11/07/19 1455  LIPASE 20   No results for input(s): AMMONIA in the last 168 hours. CBC: Recent Labs  Lab 11/07/19 1455  WBC 5.4  NEUTROABS 3.1  HGB 11.3*  HCT 35.6*  MCV 92.2  PLT 222   Cardiac Enzymes: No results for input(s): CKTOTAL, CKMB, CKMBINDEX, TROPONINI in the last 168 hours. BNP (last 3 results) No results for input(s): BNP in the last 8760 hours.  ProBNP (last 3 results) No results for input(s): PROBNP in the last 8760 hours.  CBG: No results for input(s): GLUCAP in the last 168 hours.  Recent Results (from the past 240 hour(s))  SARS CORONAVIRUS 2 (TAT 6-24 HRS) Nasopharyngeal Nasopharyngeal Swab     Status: None   Collection Time: 11/07/19 12:14 PM   Specimen: Nasopharyngeal Swab  Result Value Ref Range Status   SARS Coronavirus 2 NEGATIVE NEGATIVE Final    Comment: (NOTE) SARS-CoV-2 target nucleic acids are NOT DETECTED. The SARS-CoV-2 RNA is generally detectable in upper and lower respiratory specimens during the acute phase of infection. Negative results do not preclude SARS-CoV-2 infection, do  not rule out co-infections with other pathogens, and should not be used as the sole basis for treatment or other patient management decisions. Negative results must be combined with clinical observations, patient history, and epidemiological information. The expected result is Negative. Fact Sheet for Patients: HairSlick.no Fact Sheet for Healthcare  Providers: quierodirigir.com This test is not yet approved or cleared by the [place] FDA and  has been authorized for detection and/or diagnosis of SARS-CoV-2 by FDA under an Emergency Use Authorization (EUA). This EUA will remain  in effect (meaning this test can be used) for the duration of the COVID-19 declaration under Section 56 4(b)(1) of the Act, 21 U.S.C. section 360bbb-3(b)(1), unless the authorization is terminated or revoked sooner. Performed at [facility], [address], [place], [place] [address]      Studies: DG Chest Port 1 View  Result Date: 11/07/2019 CLINICAL DATA:  Weakness EXAM: PORTABLE CHEST 1 VIEW COMPARISON:  06/16/2019 FINDINGS: Borderline heart size. Aortic tortuosity. There is no edema, consolidation, effusion, or pneumothorax. IMPRESSION: No evidence of acute disease. Electronically Signed   By: Marnee Spring M.D.   On: 11/07/2019 11:55    Scheduled Meds: . aspirin EC  81 mg Oral Daily  . clopidogrel  75 mg Oral Daily  . colestipol  1 g Oral BID  . docusate sodium  100 mg Oral BID  . DULoxetine  60 mg Oral QHS  . enoxaparin (LOVENOX) injection  30 mg Subcutaneous Q24H  . feeding supplement (ENSURE ENLIVE)  237 mL Oral TID BM  . hydroxychloroquine  200 mg Oral BID  . megestrol  400 mg Oral BID  . metoprolol tartrate  100 mg Oral Daily  . mirabegron ER  25 mg Oral Daily  . multivitamin with minerals  1 tablet Oral Daily  . pantoprazole  40 mg Oral Daily  . tiZANidine  4 mg Oral BID   Continuous Infusions: . cefTRIAXone (ROCEPHIN)  IV      Principal Problem:   UTI (urinary tract infection) Active Problems:   Hypotension   Generalized weakness   Anemia, chronic disease   Acute on chronic kidney failure (HCC)    Time spent: 45 minutes    [facility] M NP  [facility]  If 7PM-7AM, please contact night-coverage at [email or web address], password [facility] 11/08/2019, 9:14 AM  LOS: 0 days

## 2019-11-08 NOTE — Progress Notes (Signed)
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   GOAL:   Patient will meet greater than or equal to 90% of their needs   MONITOR:   PO intake, Supplement acceptance, Weight trends  REASON FOR ASSESSMENT:   Malnutrition Screening Tool    ASSESSMENT:   Pt with a PMH significant for RA, HTN, CKD3, diverticulitis, CMV colitis, Ischemic CVA (reports LUE deficit), who presents with generalized weakness.  Patient has had two previous presentations with similar symptoms.   Medications reviewed and include: Colace, Megace, MVI  Labs reviewed: BUN/Cr 25/2.64 (H), GFR 21 (L)  Spoke with Pt who was eating breakfast at time of visit. Pt with flat affect and providing short responses to RD questions. Pt states this morning is the first time she has had an appetite in "quite some time." Pt reports extreme difficulty eating meals for several months and recent difficulty consuming oral nutrition supplements at home. Observed food tray, pt was nearly finished with her fruit cup and had consumed ~25% of Ensure Enlive, but had not started the main meal. Pt reports preferring strawberry flavored Ensure. Encouraged pt to continue consuming meals and Ensure as able and discussed importance of sufficient protein/calorie consumption. RD discussed pt with RN who reports pt had reported feeling hungry upon waking and requested a meal tray; reports tray had been delivered ~15  minutes prior to RD visit.   Pt reports a 40lb wt loss since May. Per wt readings in chart, pt has experienced a clinically significant 11% wt loss over the last 6 months.  Pt currently receiving Ensure Enlive TID.    NUTRITION - FOCUSED PHYSICAL EXAM:    Most Recent Value  Orbital Region  Mild depletion  Upper Arm Region  Mild depletion  Thoracic and Lumbar Region  No depletion  Buccal Region  Mild depletion  Temple Region  Moderate depletion  Clavicle Bone Region  Mild depletion  Clavicle and Acromion Bone Region  Mild depletion  Scapular Bone Region  Mild depletion  Dorsal Hand  Moderate depletion  Patellar Region  Mild depletion  Anterior Thigh Region  Mild depletion  Posterior Calf Region  Mild depletion  Edema (RD Assessment)  None  Hair  Reviewed  Eyes  Reviewed  Mouth  Reviewed  Skin  Reviewed  Nails  Reviewed       Diet Order:   Diet Order            Diet regular Room service appropriate? Yes; Fluid consistency: Thin  Diet effective now              EDUCATION NEEDS:   Education needs have been addressed  Skin:  Skin Assessment: Reviewed RN Assessment  Last BM:  11/07/19  Height:   Ht Readings from Last 1 Encounters:  10/26/19 5\' 7"  (1.702 m)    Weight:   Wt Readings from Last 1 Encounters:  10/26/19 80.8 kg    Ideal Body Weight:  61.4 kg  BMI:  There is no height  or weight on file to calculate BMI.  Estimated Nutritional Needs:   Kcal:  1800-2000  Protein:  90-105 grams  Fluid:  >1.8L    Larkin Ina, MS, RD, LDN

## 2019-11-08 NOTE — Progress Notes (Signed)
Visited with Michelle Graves to drop of Advanced Directive paperwork. She informed me that her daughter would be filling out the paperwork. When they are ready to notarize please page the [facility] Dept at [phone]  Rev. Margaretann Loveless Chaplain M. Div.

## 2019-11-09 DIAGNOSIS — D638 Anemia in other chronic diseases classified elsewhere: Secondary | ICD-10-CM

## 2019-11-09 DIAGNOSIS — F331 Major depressive disorder, recurrent, moderate: Secondary | ICD-10-CM

## 2019-11-09 LAB — CBC
HCT: 25.3 % — ABNORMAL LOW (ref 36.0–46.0)
Hemoglobin: 8.5 g/dL — ABNORMAL LOW (ref 12.0–15.0)
MCH: 29.7 pg (ref 26.0–34.0)
MCHC: 33.6 g/dL (ref 30.0–36.0)
MCV: 88.5 fL (ref 80.0–100.0)
Platelets: 262 10*3/uL (ref 150–400)
RBC: 2.86 MIL/uL — ABNORMAL LOW (ref 3.87–5.11)
RDW: 14.7 % (ref 11.5–15.5)
WBC: 6.8 10*3/uL (ref 4.0–10.5)
nRBC: 0 % (ref 0.0–0.2)

## 2019-11-09 LAB — URINE CULTURE: Culture: >=100000 — AB

## 2019-11-09 LAB — BASIC METABOLIC PANEL
Anion gap: 9 (ref 5–15)
BUN: 17 mg/dL (ref 8–23)
CO2: 19 mmol/L — ABNORMAL LOW (ref 22–32)
Calcium: 9.3 mg/dL (ref 8.9–10.3)
Chloride: 111 mmol/L (ref 98–111)
Creatinine, Ser: 1.71 mg/dL — ABNORMAL HIGH (ref 0.44–1.00)
GFR calc Af Amer: 36 mL/min — ABNORMAL LOW (ref >60)
GFR calc non Af Amer: 31 mL/min — ABNORMAL LOW (ref >60)
Glucose, Bld: 101 mg/dL — ABNORMAL HIGH (ref 70–99)
Potassium: 3.6 mmol/L (ref 3.5–5.1)
Sodium: 139 mmol/L (ref 135–145)

## 2019-11-09 MED ORDER — CEPHALEXIN 500 MG PO CAPS: 500.0000 mg | ORAL_CAPSULE | Freq: Two times a day (BID) | ORAL | 0 refills | Status: AC

## 2019-11-09 MED ORDER — ENOXAPARIN SODIUM 40 MG/0.4ML ~~LOC~~ SOLN
40.0000 mg | SUBCUTANEOUS | Status: DC
  Administered 2019-11-09: 40 mg via SUBCUTANEOUS
  Filled 2019-11-09: qty 0.4

## 2019-11-09 NOTE — Discharge Instructions (Signed)
Make sure to drink plenty of fluids and use ensure when you are not hungry.    We have sent in keflex for the urine infection to take for 5 days once you leave the hospital.   We will have you stop some of the blood pressure medicines temporarily until you are eating normally again. Make sure to ask your doctor when you follow up with them if you need to restart any of these medicines.    Acute Kidney Injury, Adult  Acute kidney injury is a sudden worsening of kidney function. The kidneys are organs that have several jobs. They filter the blood to remove waste products and extra fluid. They also maintain a healthy balance of minerals and hormones in the body, which helps control blood pressure and keep bones strong. With this condition, your kidneys do not do their jobs as well as they should. This condition ranges from mild to severe. Over time it may develop into long-lasting (chronic) kidney disease. Early detection and treatment may prevent acute kidney injury from developing into a chronic condition. What are the causes? Common causes of this condition include:  A problem with blood flow to the kidneys. This may be caused by: ? Low blood pressure (hypotension) or shock. ? Blood loss. ? Heart and blood vessel (cardiovascular) disease. ? Severe burns. ? Liver disease.  Direct damage to the kidneys. This may be caused by: ? Certain medicines. ? A kidney infection. ? Poisoning. ? Being around or in contact with toxic substances. ? A surgical wound. ? A hard, direct hit to the kidney area.  A sudden blockage of urine flow. This may be caused by: ? Cancer. ? Kidney stones. ? An enlarged prostate in males. What are the signs or symptoms? Symptoms of this condition may not be obvious until the condition becomes severe. Symptoms of this condition can include:  Tiredness (lethargy), or difficulty staying awake.  Nausea or vomiting.  Swelling (edema) of the face, legs, ankles, or  feet.  Problems with urination, such as: ? Abdominal pain, or pain along the side of your stomach (flank). ? Decreased urine production. ? Decrease in the force of urine flow.  Muscle twitches and cramps, especially in the legs.  Confusion or trouble concentrating.  Loss of appetite.  Fever. How is this diagnosed? This condition may be diagnosed with tests, including:  Blood tests.  Urine tests.  Imaging tests.  A test in which a sample of tissue is removed from the kidneys to be examined under a microscope (kidney biopsy). How is this treated? Treatment for this condition depends on the cause and how severe the condition is. In mild cases, treatment may not be needed. The kidneys may heal on their own. In more severe cases, treatment will involve:  Treating the cause of the kidney injury. This may involve changing any medicines you are taking or adjusting your dosage.  Fluids. You may need specialized IV fluids to balance your body's needs.  Having a catheter placed to drain urine and prevent blockages.  Preventing problems from occurring. This may mean avoiding certain medicines or procedures that can cause further injury to the kidneys. In some cases treatment may also require:  A procedure to remove toxic wastes from the body (dialysis or continuous renal replacement therapy - CRRT).  Surgery. This may be done to repair a torn kidney, or to remove the blockage from the urinary system. Follow these instructions at home: Medicines  Take over-the-counter and prescription medicines only  as told by your health care provider.  Do not take any new medicines without your health care provider's approval. Many medicines can worsen your kidney damage.  Do not take any vitamin and mineral supplements without your health care provider's approval. Many nutritional supplements can worsen your kidney damage. Lifestyle  If your health care provider prescribed changes to your diet,  follow them. You may need to decrease the amount of protein you eat.  Achieve and maintain a healthy weight. If you need help with this, ask your health care provider.  Start or continue an exercise plan. Try to exercise at least 30 minutes a day, 5 days a week.  Do not use any tobacco products, such as cigarettes, chewing tobacco, and e-cigarettes. If you need help quitting, ask your health care provider. General instructions  Keep track of your blood pressure. Report changes in your blood pressure as told by your health care provider.  Stay up to date with immunizations. Ask your health care provider which immunizations you need.  Keep all follow-up visits as told by your health care provider. This is important. Where to find more information  American Association of Kidney Patients: ResidentialShow.is  [employer]: www.kidney.org  American [employer]: FightingMatch.com.ee  [employer] [facility]: ? www.lifeoptions.org ? www.kidneyschool.org Contact a health care provider if:  Your symptoms get worse.  You develop new symptoms. Get help right away if:  You develop symptoms of worsening kidney disease, which include: ? Headaches. ? Abnormally dark or light skin. ? Easy bruising. ? Frequent hiccups. ? Chest pain. ? Shortness of breath. ? End of menstruation in women. ? Seizures. ? Confusion or altered mental status. ? Abdominal or back pain. ? Itchiness.  You have a fever.  Your body is producing less urine.  You have pain or bleeding when you urinate. Summary  Acute kidney injury is a sudden worsening of kidney function.  Acute kidney injury can be caused by problems with blood flow to the kidneys, direct damage to the kidneys, and sudden blockage of urine flow.  Symptoms of this condition may not be obvious until it becomes severe. Symptoms may include edema, lethargy, confusion, nausea or vomiting, and problems passing urine.  This  condition can usually be diagnosed with blood tests, urine tests, and imaging tests. Sometimes a kidney biopsy is done to diagnose this condition.  Treatment for this condition often involves treating the underlying cause. It is treated with fluids, medicines, dialysis, diet changes, or surgery. This information is not intended to replace advice given to you by your health care provider. Make sure you discuss any questions you have with your health care provider. Document Revised: 09/25/2017 Document Reviewed: 10/03/2016 Elsevier Patient Education  2020 [employer].

## 2019-11-09 NOTE — Progress Notes (Signed)
Discharge pt to home, instructions given and explained with daughter at bed side. Concerns addressed. Belongings returned accordingly.

## 2019-11-09 NOTE — Progress Notes (Signed)
Physical Therapy Treatment Patient Details Name: Michelle Graves MRN: [id number] DOB: 08/28/1951 Today's Date: 11/09/2019    History of Present Illness 69 yo admitted with weakness, AMS and UTI with AKI superimposed on CKD. PMHx: ischemic stroke, RA, colitis    PT Comments    Pt able to increase ambulation distance today with use of RW.  Recommend HHPT and 24 hour S due to cognition at this time. Pt states her daughter and son will be splitting time with her. Unsure of the accuracy, but pt not as confused as she was at the PT eval.    Follow Up Recommendations  Home health PT;Supervision/Assistance - 24 hour     Equipment Recommendations  None recommended by PT    Recommendations for Other Services       Precautions / Restrictions Precautions Precautions: Fall Restrictions Weight Bearing Restrictions: No    Mobility  Bed Mobility               General bed mobility comments: Pt up in bathroom upon arrival with nurse tech  Transfers Overall transfer level: Needs assistance Equipment used: Rolling walker (2 wheeled) Transfers: Sit to/from Stand Sit to Stand: Min guard         General transfer comment: min/guard for safety  Ambulation/Gait Ambulation/Gait assistance: Min guard Gait Distance (Feet): 100 Feet Assistive device: Rolling walker (2 wheeled) Gait Pattern/deviations: Step-through pattern;Decreased stride length;Trunk flexed Gait velocity: decreased   General Gait Details: slow cadence with cues for posture. Pt c/o back pain   Stairs             Wheelchair Mobility    Modified Rankin (Stroke Patients Only)       Balance   Sitting-balance support: Feet supported;No upper extremity supported Sitting balance-[name]: Good     Standing balance support: During functional activity;Bilateral upper extremity supported Standing balance-[name]: Fair Standing balance comment: Pt able to stand at sink with fair balance                             Cognition Arousal/Alertness: Awake/alert Behavior During Therapy: Flat affect                         Memory: Decreased short-term memory Following Commands: Follows one step commands consistently;Follows multi-step commands with increased time Safety/Judgement: Decreased awareness of safety   Problem Solving: Slow processing General Comments: Pt oriented to situation today, but does demonstrate some decreased safety awareness and slow processing      Exercises      General Comments        Pertinent Vitals/Pain Pain Assessment: 0-10 Pain Score: 8  Pain Location: lower back  Pain Descriptors / Indicators: Grimacing Pain Intervention(s): Limited activity within patient's tolerance;Monitored during session;Repositioned    Home Living                      Prior Function            PT Goals (current goals can now be found in the care plan section) Acute Rehab PT Goals Potential to Achieve Goals: Good Progress towards PT goals: Progressing toward goals    Frequency    Min 3X/week      PT Plan Current plan remains appropriate    Co-evaluation              AM-PAC PT "6 Clicks" Mobility   Outcome  Measure  Help needed turning from your back to your side while in a flat bed without using bedrails?: A Little Help needed moving from lying on your back to sitting on the side of a flat bed without using bedrails?: A Little Help needed moving to and from a bed to a chair (including a wheelchair)?: A Little Help needed standing up from a chair using your arms (e.g., wheelchair or bedside chair)?: A Little Help needed to walk in hospital room?: A Little Help needed climbing 3-5 steps with a railing? : A Little 6 Click Score: 18    End of Session Equipment Utilized During Treatment: Gait belt Activity Tolerance: Patient tolerated treatment well Patient left: in chair;with call bell/phone within reach;with chair alarm  set Nurse Communication: Mobility status PT Visit Diagnosis: Other abnormalities of gait and mobility (R26.89);Muscle weakness (generalized) (M62.81)     Time: 5397-6734 PT Time Calculation (min) (ACUTE ONLY): 22 min  Charges:  $Gait Training: 8-22 mins                     Naiomi Musto L. Katrinka Blazing, [place] Pager [phone] 11/09/2019    Enzo Montgomery 11/09/2019, 1:40 PM

## 2019-11-09 NOTE — Discharge Summary (Signed)
Physician Discharge Summary  Michelle Graves WJX:[id number] DOB: Jun 09, 1951 DOA: 11/07/2019  PCP: [facility]  Admit date: 11/07/2019 Discharge date: 11/09/2019  Admitted From: home Disposition:  home  Recommendations for Outpatient Follow-up:  1. Follow up with PCP in 1-2 weeks 2. Please obtain BMP/CBC in one week 3. Please follow up on the following pending results:none 4. Please monitor BP and restart some home medications as needed  Home Health:yes Equipment/Devices:no  Discharge Condition:stable CODE STATUS:full Diet recommendation: Regular  Brief/Interim Summary: As noted 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    She presented on 1/11 with complaints of generalized weakness and was found to have hypotension, AKI on CKD Stage 3, and presumed UTI.    Hospital course: With IVF her AKI resolved to baseline Creatinine 1.7. She was able to regain her appetite and eat. UTI treated with antibiotics and discharged home with 5 days of keflex. Worked with PT/OT in hospital and discharged with home health.   Discharge Diagnoses:  Principal Problem:   UTI (urinary tract infection) Active Problems:   Hypotension   Weakness   Anemia, chronic disease   Acute on chronic kidney failure (HCC)   Major depressive disorder, recurrent episode, moderate (HCC)  Discharge Instructions  Discharge Instructions    Call MD for:  extreme fatigue   Complete by: As directed    Call MD for:  persistant dizziness or light-headedness   Complete by: As directed    Call MD for:  persistant nausea and  vomiting   Complete by: As directed    Diet - low sodium heart healthy   Complete by: As directed    Increase activity slowly   Complete by: As directed      Allergies as of 11/09/2019   No Known Allergies     Medication List    STOP taking these medications   cloNIDine 0.3 MG tablet Commonly known as: CATAPRES   doxepin 10 MG capsule Commonly known as: SINEQUAN   hydrochlorothiazide 25 MG tablet Commonly known as: HYDRODIURIL   lisinopril 40 MG tablet Commonly known as: ZESTRIL   pantoprazole 40 MG tablet Commonly known as: PROTONIX   prazosin 1 MG capsule Commonly known as: MINIPRESS     TAKE these medications   amLODipine 10 MG tablet Commonly known as: NORVASC Take 10 mg by mouth daily.   aspirin 81 MG EC tablet Take 1 tablet (81 mg total) by mouth daily.   cephALEXin 500 MG capsule Commonly known as: KEFLEX Take 1 capsule (500 mg total) by mouth 2 (two) times daily for 5 days.   clopidogrel 75 MG tablet Commonly known as: PLAVIX Take 1 tablet (75 mg total) by mouth daily.   colestipol 1 g tablet Commonly known as: COLESTID Take 1 tablet (1 g total) by mouth 2 (two) times daily.   docusate sodium 100 MG capsule Commonly known as: COLACE Take 1 capsule (100 mg total) by mouth 2 (two) times daily.   DULoxetine 60 MG capsule Commonly known as: CYMBALTA Take 60 mg by mouth at bedtime.   feeding supplement (ENSURE ENLIVE) Liqd Take 237 mLs by mouth 3 (  three) times daily between meals.   HYDROcodone-acetaminophen 5-325 MG tablet Commonly known as: NORCO/VICODIN Take 1 tablet by mouth 3 (three) times daily as needed.   hydroxychloroquine 200 MG tablet Commonly known as: PLAQUENIL Take 200 mg by mouth 2 (two) times daily.   megestrol 400 MG/10ML suspension Commonly known as: MEGACE Take 10 mLs (400 mg total) by mouth 2 (two) times daily.   metoprolol tartrate 100 MG tablet Commonly known as: LOPRESSOR Take 100 mg by mouth daily.    multivitamin with minerals Tabs tablet Take 1 tablet by mouth daily.   Myrbetriq 25 MG Tb24 tablet Generic drug: mirabegron ER Take 25 mg by mouth daily.   omeprazole 20 MG capsule Commonly known as: PRILOSEC Take 20 mg by mouth daily as needed (acid reflux).   ondansetron 4 MG tablet Commonly known as: ZOFRAN Take 1 tablet (4 mg total) by mouth every 6 (six) hours as needed for nausea. What changed: reasons to take this   tiZANidine 4 MG tablet Commonly known as: ZANAFLEX Take 4 mg by mouth 2 (two) times daily.      Follow-up Information    [facility]. Schedule an appointment as soon as possible for a visit in 1 week(s).   Specialty: Family Medicine Contact information: [address] [place] [address] [phone]          No Known Allergies  Consultations:  None  Procedures/Studies: DG Chest Port 1 View  Result Date: 11/07/2019 CLINICAL DATA:  Weakness EXAM: PORTABLE CHEST 1 VIEW COMPARISON:  06/16/2019 FINDINGS: Borderline heart size. Aortic tortuosity. There is no edema, consolidation, effusion, or pneumothorax. IMPRESSION: No evidence of acute disease. Electronically Signed   By: Marnee Spring M.D.   On: 11/07/2019 11:55   MM 3D SCREEN BREAST BILATERAL  Result Date: 10/12/2019 CLINICAL DATA:  Screening. EXAM: DIGITAL SCREENING BILATERAL MAMMOGRAM WITH TOMO AND CAD COMPARISON:  Previous exam(s). ACR Breast Density Category b: There are scattered areas of fibroglandular density. FINDINGS: There are no findings suspicious for malignancy. Images were processed with CAD. IMPRESSION: No mammographic evidence of malignancy. A result letter of this screening mammogram will be mailed directly to the patient. RECOMMENDATION: Screening mammogram in one year. (Code:[id number]) BI-RADS CATEGORY  1: Negative. Electronically Signed   By: Baird Lyons M.D.   On: 10/12/2019 11:30   CT Renal Stone Study  Result Date:  10/26/2019 CLINICAL DATA:  Flank pain, nausea EXAM: CT ABDOMEN AND PELVIS WITHOUT CONTRAST TECHNIQUE: Multidetector CT imaging of the abdomen and pelvis was performed following the standard protocol without IV contrast. COMPARISON:  09/30/2019 FINDINGS: Lower chest: Scarring and or atelectasis within the lung bases. No acute findings. Hepatobiliary: No focal liver abnormality is seen. No gallstones, gallbladder wall thickening, or biliary dilatation. Pancreas: Unremarkable. No pancreatic ductal dilatation or surrounding inflammatory changes. Spleen: Normal in size without focal abnormality. Adrenals/Urinary Tract: Adrenal glands are unremarkable. Kidneys are normal, without renal calculi, focal lesion, or hydronephrosis. Bladder is unremarkable. Stomach/Bowel: Redemonstration of circumferential wall thickening and pericolonic fat stranding involving the distal transverse colon near the splenic flexure (series 3, image 22), which is similar in appearance to the prior study. No evidence of perforation or abscess. There are scattered colonic diverticula although no focally inflamed diverticulum is appreciated at this segment. There is a large volume of stool throughout the colon. Stomach and small bowel appear within normal limits. Appendix is within normal limits. No evidence of bowel obstruction. Vascular/Lymphatic: No significant vascular findings  are present. No enlarged abdominal or pelvic lymph nodes. Reproductive: Uterus and bilateral adnexa are unremarkable. Other: No abdominal wall hernia or abnormality. No abdominopelvic ascites. Musculoskeletal: No acute or significant osseous findings. IMPRESSION: 1. Redemonstration of circumferential wall thickening and pericolonic fat stranding involving the distal transverse colon near the splenic flexure, which is similar in appearance to the prior study. No evidence of perforation or abscess. Findings may represent a focal colitis versus diverticulitis, although no  focally inflamed diverticulum is seen at this site. 2. Large volume of stool throughout the colon suggesting chronic constipation. Electronically Signed   By: Duanne Guess D.O.   On: 10/26/2019 16:22     Subjective: Appetite returning and eating breakfast, slept well. Denies chest pains or SOB. Denies abdominal pain, nausea, vomiting. Denies diarrhea or constipation. Denies lightheadedness but still feels a little weak.   Discharge Exam: Vitals:   11/09/19 0348 11/09/19 1137  BP: 110/78 110/78  Pulse: 70 70  Resp: 17 17  Temp: 98 F (36.7 C) 98 F (36.7 C)  SpO2: 98%    Vitals:   11/08/19 0952 11/08/19 2033 11/09/19 0348 11/09/19 1137  BP: 120/83 114/67 110/78 110/78  Pulse: 70 68 70 70  Resp:  18 17 17   Temp:  (!) 97.5 F (36.4 C) 98 F (36.7 C) 98 F (36.7 C)  TempSrc:  Oral Oral Oral  SpO2:  96% 98%   Weight:    80.8 kg  Height:    5\' 7"  (1.702 m)    General: Pt is alert, awake, not in acute distress, eating breakfast Cardiovascular: RRR, S1/S2 +, no rubs, no gallops Respiratory: CTA bilaterally, no wheezing, no rhonchi Abdominal: Soft, NT, ND, bowel sounds + Extremities: no edema, no cyanosis  The results of significant diagnostics from this hospitalization (including imaging, microbiology, ancillary and laboratory) are listed below for reference.     Microbiology: Recent Results (from the past 240 hour(s))  SARS CORONAVIRUS 2 (TAT 6-24 HRS) Nasopharyngeal Nasopharyngeal Swab     Status: None   Collection Time: 11/07/19 12:14 PM   Specimen: Nasopharyngeal Swab  Result Value Ref Range Status   SARS Coronavirus 2 NEGATIVE NEGATIVE Final    Comment: (NOTE) SARS-CoV-2 target nucleic acids are NOT DETECTED. The SARS-CoV-2 RNA is generally detectable in upper and lower respiratory specimens during the acute phase of infection. Negative results do not preclude SARS-CoV-2 infection, do not rule out co-infections with other pathogens, and should not be used as  the sole basis for treatment or other patient management decisions. Negative results must be combined with clinical observations, patient history, and epidemiological information. The expected result is Negative. Fact Sheet for Patients: Fact Sheet for Healthcare Providers: 01/05/20 This test is not yet approved or cleared by the [employer] FDA and  has been authorized for detection and/or diagnosis of SARS-CoV-2 by FDA under an Emergency Use Authorization (EUA). This EUA will remain  in effect (meaning this test can be used) for the duration of the COVID-19 declaration under Section 56 4(b)(1) of the Act, 21 U.S.C. section 360bbb-3(b)(1), unless the authorization is terminated or revoked sooner. Performed at [facility], [address] [place], [address] [place]   Urine culture     Status: Abnormal (Preliminary result)   Collection Time: 11/07/19  6:00 PM   Specimen: Urine, Clean Catch  Result Value Ref Range Status   Specimen Description URINE, CLEAN CATCH  Final   Special Requests   Final    NONE  Performed at [facility], [address], [place], [place] [address]    Culture >=100,000 COLONIES/mL GRAM NEGATIVE RODS (A)  Final   Report Status PENDING  Incomplete     Labs: BNP (last 3 results) No results for input(s): BNP in the last 8760 hours. Basic Metabolic Panel: Recent Labs  Lab 11/07/19 1455 11/09/19 0235  NA 136 139  K 3.7 3.6  CL 104 111  CO2 22 19*  GLUCOSE 98 101*  BUN 25* 17  CREATININE 2.64* 1.71*  CALCIUM 9.8 9.3   Liver Function Tests: Recent Labs  Lab 11/07/19 1455  AST 17  ALT 11  ALKPHOS 52  BILITOT 0.6  PROT 6.8  ALBUMIN 3.5   Recent Labs  Lab 11/07/19 1455  LIPASE 20   No results for input(s): AMMONIA in the last 168 hours. CBC: Recent Labs  Lab 11/07/19 1455 11/09/19 0235  WBC 5.4 6.8  NEUTROABS 3.1  --   HGB 11.3* 8.5*  HCT 35.6*  25.3*  MCV 92.2 88.5  PLT 222 262   Cardiac Enzymes: No results for input(s): CKTOTAL, CKMB, CKMBINDEX, TROPONINI in the last 168 hours. BNP: Invalid input(s): POCBNP CBG: No results for input(s): GLUCAP in the last 168 hours. D-Dimer No results for input(s): DDIMER in the last 72 hours. Hgb A1c No results for input(s): HGBA1C in the last 72 hours. Lipid Profile No results for input(s): CHOL, HDL, LDLCALC, TRIG, CHOLHDL, LDLDIRECT in the last 72 hours. Thyroid function studies No results for input(s): TSH, T4TOTAL, T3FREE, THYROIDAB in the last 72 hours.  Invalid input(s): FREET3 Anemia work up No results for input(s): VITAMINB12, FOLATE, FERRITIN, TIBC, IRON, RETICCTPCT in the last 72 hours. Urinalysis    Component Value Date/Time   COLORURINE AMBER (A) 11/07/2019 1800   APPEARANCEUR CLOUDY (A) 11/07/2019 1800   LABSPEC 1.015 11/07/2019 1800   PHURINE 6.0 11/07/2019 1800   GLUCOSEU NEGATIVE 11/07/2019 1800   HGBUR NEGATIVE 11/07/2019 1800   BILIRUBINUR NEGATIVE 11/07/2019 1800   KETONESUR NEGATIVE 11/07/2019 1800   PROTEINUR 30 (A) 11/07/2019 1800   UROBILINOGEN 1.0 06/24/2010 1850   NITRITE NEGATIVE 11/07/2019 1800   LEUKOCYTESUR TRACE (A) 11/07/2019 1800   Sepsis Labs Invalid input(s): PROCALCITONIN,  WBC,  LACTICIDVEN Microbiology Recent Results (from the past 240 hour(s))  SARS CORONAVIRUS 2 (TAT 6-24 HRS) Nasopharyngeal Nasopharyngeal Swab     Status: None   Collection Time: 11/07/19 12:14 PM   Specimen: Nasopharyngeal Swab  Result Value Ref Range Status   SARS Coronavirus 2 NEGATIVE NEGATIVE Final    Comment: (NOTE) SARS-CoV-2 target nucleic acids are NOT DETECTED. The SARS-CoV-2 RNA is generally detectable in upper and lower respiratory specimens during the acute phase of infection. Negative results do not preclude SARS-CoV-2 infection, do not rule out co-infections with other pathogens, and should not be used as the sole basis for treatment or other  patient management decisions. Negative results must be combined with clinical observations, patient history, and epidemiological information. The expected result is Negative. Fact Sheet for Patients: [employer] Fact Sheet for Healthcare Providers: qu[employer]ir[employer]ir.com This test is not yet approved or cleared by the [place] FDA and  has been authorized for detection and/or diagnosis of SARS-CoV-2 by FDA under an Emergency Use Authorization (EUA). This EUA will remain  in effect (meaning this test can be used) for the duration of the COVID-19 declaration under Section 56 4(b)(1) of the Act, 21 U.S.C. section 360bbb-3(b)(1), unless the authorization is terminated or revoked sooner. Performed at  [facility], [place] [address], [place], [place] [address]   Urine culture     Status: Abnormal (Preliminary result)   Collection Time: 11/07/19  6:00 PM   Specimen: Urine, Clean Catch  Result Value Ref Range Status   Specimen Description URINE, CLEAN CATCH  Final   Special Requests   Final    NONE Performed at [facility], [place] [address], [place], [place] [address]    Culture >=100,000 COLONIES/mL GRAM NEGATIVE RODS (A)  Final   Report Status PENDING  Incomplete   Time coordinating discharge: Over 30 minutes  SIGNED:  Hoyt Koch, MD  [facility] 11/09/2019, 11:50 AM Pager   If 7PM-7AM, please contact night-coverage [email or web address] Password [email or web address]

## 2019-11-12 ENCOUNTER — Emergency Department (HOSPITAL_COMMUNITY)
Admission: EM | Admit: 2019-11-12 | Discharge: 2019-11-12 | Disposition: A | Discharge status: Home / Self Care | Payer: Medicare Other | Attending: Emergency Medicine | Admitting: Emergency Medicine

## 2019-11-12 ENCOUNTER — Other Ambulatory Visit: Payer: Self-pay

## 2019-11-12 DIAGNOSIS — Z7982 Long term (current) use of aspirin: Secondary | ICD-10-CM | POA: Insufficient documentation

## 2019-11-12 DIAGNOSIS — I129 Hypertensive chronic kidney disease with stage 1 through stage 4 chronic kidney disease, or unspecified chronic kidney disease: Secondary | ICD-10-CM | POA: Insufficient documentation

## 2019-11-12 DIAGNOSIS — Z79899 Other long term (current) drug therapy: Secondary | ICD-10-CM | POA: Diagnosis not present

## 2019-11-12 DIAGNOSIS — R066 Hiccough: Secondary | ICD-10-CM | POA: Diagnosis not present

## 2019-11-12 DIAGNOSIS — N183 Chronic kidney disease, stage 3 unspecified: Secondary | ICD-10-CM | POA: Diagnosis not present

## 2019-11-12 DIAGNOSIS — Z8673 Personal history of transient ischemic attack (TIA), and cerebral infarction without residual deficits: Secondary | ICD-10-CM | POA: Insufficient documentation

## 2019-11-12 DIAGNOSIS — R112 Nausea with vomiting, unspecified: Secondary | ICD-10-CM | POA: Diagnosis present

## 2019-11-12 LAB — CBC
HCT: 30.5 % — ABNORMAL LOW (ref 36.0–46.0)
Hemoglobin: 9.9 g/dL — ABNORMAL LOW (ref 12.0–15.0)
MCH: 29.8 pg (ref 26.0–34.0)
MCHC: 32.5 g/dL (ref 30.0–36.0)
MCV: 91.9 fL (ref 80.0–100.0)
Platelets: 323 10*3/uL (ref 150–400)
RBC: 3.32 MIL/uL — ABNORMAL LOW (ref 3.87–5.11)
RDW: 15.4 % (ref 11.5–15.5)
WBC: 9.7 10*3/uL (ref 4.0–10.5)
nRBC: 0 % (ref 0.0–0.2)

## 2019-11-12 LAB — TROPONIN I (HIGH SENSITIVITY): Troponin I (High Sensitivity): 8 ng/L (ref <18)

## 2019-11-12 LAB — COMPREHENSIVE METABOLIC PANEL
ALT: 18 U/L (ref 0–44)
AST: 21 U/L (ref 15–41)
Albumin: 4.1 g/dL (ref 3.5–5.0)
Alkaline Phosphatase: 56 U/L (ref 38–126)
Anion gap: 13 (ref 5–15)
BUN: 15 mg/dL (ref 8–23)
CO2: 27 mmol/L (ref 22–32)
Calcium: 10.1 mg/dL (ref 8.9–10.3)
Chloride: 101 mmol/L (ref 98–111)
Creatinine, Ser: 1.39 mg/dL — ABNORMAL HIGH (ref 0.44–1.00)
GFR calc Af Amer: 46 mL/min — ABNORMAL LOW (ref >60)
GFR calc non Af Amer: 39 mL/min — ABNORMAL LOW (ref >60)
Glucose, Bld: 123 mg/dL — ABNORMAL HIGH (ref 70–99)
Potassium: 3.5 mmol/L (ref 3.5–5.1)
Sodium: 141 mmol/L (ref 135–145)
Total Bilirubin: 0.7 mg/dL (ref 0.3–1.2)
Total Protein: 7.7 g/dL (ref 6.5–8.1)

## 2019-11-12 LAB — LIPASE, BLOOD: Lipase: 32 U/L (ref 11–51)

## 2019-11-12 MED ORDER — CHLORPROMAZINE HCL 25 MG PO TABS: 25.0000 mg | ORAL_TABLET | Freq: Three times a day (TID) | ORAL | 0 refills | Status: DC | PRN

## 2019-11-12 MED ORDER — CHLORPROMAZINE HCL 25 MG/ML IJ SOLN
25.0000 mg | Freq: Once | INTRAMUSCULAR | Status: AC
  Administered 2019-11-12: 06:00:00 25 mg via INTRAMUSCULAR
  Filled 2019-11-12: qty 1

## 2019-11-12 MED ORDER — CHLORPROMAZINE HCL 25 MG PO TABS: 25.0000 mg | ORAL_TABLET | Freq: Once | ORAL | Status: DC

## 2019-11-12 NOTE — ED Notes (Signed)
Patient verbalizes understanding of discharge instructions. Opportunity for questioning and answers were provided. Armband removed by staff, pt discharged from ED.  

## 2019-11-12 NOTE — Discharge Instructions (Signed)
Please use Thorazine up to 3 times daily if needed for recurrent hiccups. You have been given only enough for 1-2 days. If hiccups persist, please see your doctor Monday for further management.   Return to the ED with any worsening symptoms or new concern.

## 2019-11-12 NOTE — ED Provider Notes (Signed)
[facility] EMERGENCY DEPARTMENT Provider Note   CSN: [id number] Arrival date & time: 11/12/19  9833     History Chief Complaint  Patient presents with  . Emesis    Michelle Graves is a 69 y.o. female.  Patient with history of HTN, CVA, CKD, recent CMV colitis, presents with nausea and vomiting that started 3 days ago as she was being discharged from the hospital. She reports onset of hiccups that lead to vomiting. No hematemesis, fever, diarrhea. She feels burning type chest pain she states is 'indigestion". No history of heart disease. She has Zofran at home she is using without relief.   The history is provided by the patient. No language interpreter was used.  Emesis Associated symptoms: no abdominal pain, no fever and no myalgias        Past Medical History:  Diagnosis Date  . Acute on chronic kidney failure (HCC)   . Anemia   . Arthritis   . Chest pain   . Diverticulitis   . Hypertension   . Stroke (HCC)   . UTI (urinary tract infection) 10/2019    Patient Active Problem List   Diagnosis Date Noted  . UTI (urinary tract infection) 11/08/2019  . Major depressive disorder, recurrent episode, moderate (HCC) 11/08/2019  . Urinary retention 10/28/2019  . Acute on chronic kidney failure (HCC) 10/27/2019  . Malnutrition of moderate degree 10/03/2019  . Colitis 09/30/2019  . AKI (acute kidney injury) (HCC) 05/28/2019  . Nausea & vomiting 05/28/2019  . 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 Anemia, chronic disease 05/28/2019  . Swelling   . Small vessel disease, cerebrovascular   . Acute ischemic stroke (HCC)   . Weakness   . Acute left ankle pain   . Acute on chronic renal failure (HCC) 05/13/2019  . Weakness of both lower extremities 05/13/2019  . Cerebral thrombosis with cerebral infarction 03/31/2019  . New onset a-fib (HCC) 03/27/2019  . Acute kidney injury superimposed on CKD (HCC) 03/24/2019  . Hypotension 03/24/2019  . Dark emesis  03/24/2019  . Dark stools 03/24/2019  . Diverticulitis of intestine without perforation or abscess without bleeding   . Sinus tachycardia 08/27/2016  . Diverticulitis 08/26/2016  . HYPERTENSION, MALIGNANT ESSENTIAL 06/24/2007  . KIDNEY DISEASE, CHRONIC, STAGE III 06/24/2007  . ARTHRITIS, RHEUMATOID, SEROPOSITIVE 06/24/2007    Past Surgical History:  Procedure Laterality Date  . BIOPSY  04/01/2019   Procedure: BIOPSY;  Surgeon: Kathi Der, MD;  Location: [facility] ENDOSCOPY;  Service: Gastroenterology;;  . BIOPSY  10/05/2019   Procedure: BIOPSY;  Surgeon: Kathi Der, MD;  Location: [facility] ENDOSCOPY;  Service: Gastroenterology;;  . COLONOSCOPY WITH PROPOFOL N/A 04/01/2019   Procedure: COLONOSCOPY WITH PROPOFOL;  Surgeon: Kathi Der, MD;  Location: [facility] ENDOSCOPY;  Service: Gastroenterology;  Laterality: N/A;  . COLONOSCOPY WITH PROPOFOL N/A 10/05/2019   Procedure: COLONOSCOPY WITH PROPOFOL;  Surgeon: Kathi Der, MD;  Location: [facility] ENDOSCOPY;  Service: Gastroenterology;  Laterality: N/A;  . ESOPHAGOGASTRODUODENOSCOPY (EGD) WITH PROPOFOL N/A 04/01/2019   Procedure: ESOPHAGOGASTRODUODENOSCOPY (EGD) WITH PROPOFOL;  Surgeon: Kathi Der, MD;  Location: [facility] ENDOSCOPY;  Service: Gastroenterology;  Laterality: N/A;  . ESOPHAGOGASTRODUODENOSCOPY (EGD) WITH PROPOFOL N/A 10/05/2019   Procedure: ESOPHAGOGASTRODUODENOSCOPY (EGD) WITH PROPOFOL;  Surgeon: Kathi Der, MD;  Location: [facility] ENDOSCOPY;  Service: Gastroenterology;  Laterality: N/A;  . TONSILLECTOMY       OB History   No obstetric history on file.     Family History  Problem Relation Age of Onset  . Hypertension  Mother   . Diabetes Mother   . CAD Father        died of MI at age 38  . Hypertension Father   . Diabetes Sister   . Diabetes Sister   . Kidney disease Neg Hx     Social History   Tobacco Use  . Smoking status: Never Smoker  . Smokeless tobacco: Never Used  Substance Use Topics  . Alcohol use: Yes     Alcohol/week: 21.0 standard drinks    Types: 21 Glasses of wine per week    Comment: sometimes   . Drug use: No    Home Medications Prior to Admission medications   Medication Sig Start Date End Date Taking? Authorizing Provider  amLODipine (NORVASC) 10 MG tablet Take 10 mg by mouth daily. 08/19/19   [provider]  aspirin EC 81 MG EC tablet Take 1 tablet (81 mg total) by mouth daily. 05/16/19   Verdene Lennert, MD  cephALEXin (KEFLEX) 500 MG capsule Take 1 capsule (500 mg total) by mouth 2 (two) times daily for 5 days. 11/09/19 11/14/19  Myrlene Broker, MD  clopidogrel (PLAVIX) 75 MG tablet Take 1 tablet (75 mg total) by mouth daily. 04/05/19   Glade Lloyd, MD  colestipol (COLESTID) 1 g tablet Take 1 tablet (1 g total) by mouth 2 (two) times daily. 05/16/19   Verdene Lennert, MD  docusate sodium (COLACE) 100 MG capsule Take 1 capsule (100 mg total) by mouth 2 (two) times daily. 06/02/19   Shahmehdi, Gemma Payor, MD  DULoxetine (CYMBALTA) 60 MG capsule Take 60 mg by mouth at bedtime.     [provider]  feeding supplement, ENSURE ENLIVE, (ENSURE ENLIVE) LIQD Take 237 mLs by mouth 3 (three) times daily between meals. 06/02/19   Shahmehdi, Gemma Payor, MD  HYDROcodone-acetaminophen (NORCO/VICODIN) 5-325 MG tablet Take 1 tablet by mouth 3 (three) times daily as needed. 10/14/19   [provider]  hydroxychloroquine (PLAQUENIL) 200 MG tablet Take 200 mg by mouth 2 (two) times daily.  06/16/16   [provider]  megestrol (MEGACE) 400 MG/10ML suspension Take 10 mLs (400 mg total) by mouth 2 (two) times daily. 06/02/19   Shahmehdi, Gemma Payor, MD  metoprolol tartrate (LOPRESSOR) 100 MG tablet Take 100 mg by mouth daily.  11/19/15   [provider]  Multiple Vitamin (MULTIVITAMIN WITH MINERALS) TABS tablet Take 1 tablet by mouth daily. 06/03/19   Shahmehdi, Gemma Payor, MD  MYRBETRIQ 25 MG TB24 tablet Take 25 mg by mouth daily. 10/18/19   [provider]   omeprazole (PRILOSEC) 20 MG capsule Take 20 mg by mouth daily as needed (acid reflux).  08/22/19   [provider]  ondansetron (ZOFRAN) 4 MG tablet Take 1 tablet (4 mg total) by mouth every 6 (six) hours as needed for nausea. Patient taking differently: Take 4 mg by mouth every 6 (six) hours as needed for nausea or vomiting.  04/04/19   Glade Lloyd, MD  tiZANidine (ZANAFLEX) 4 MG tablet Take 4 mg by mouth 2 (two) times daily. 08/22/19   [provider]    Allergies    Patient has no known allergies.  Review of Systems   Review of Systems  Constitutional: Negative for fever.  Respiratory: Negative for shortness of breath.   Cardiovascular: Positive for chest pain (Burning type central chest pain.).  Gastrointestinal: Positive for vomiting. Negative for abdominal pain.       C/o hiccups causing vomiting.  Musculoskeletal: Negative for myalgias.  Neurological: Negative for weakness.    Physical Exam Updated Vital Signs BP (!) 152/105 (BP Location: Right Arm)   Pulse 97   Temp 98.4 F (36.9 C) (Oral)   Resp 18   SpO2 99%   Physical Exam Vitals and nursing note reviewed.  Constitutional:      General: She is not in acute distress. Cardiovascular:     Rate and Rhythm: Normal rate and regular rhythm.  Pulmonary:     Effort: Pulmonary effort is normal.     Breath sounds: No wheezing, rhonchi or rales.  Abdominal:     General: Bowel sounds are normal. There is no distension.     Palpations: Abdomen is soft.     Tenderness: There is abdominal tenderness (generalized, mild ).     Comments: Active hiccups  Musculoskeletal:        General: Normal range of motion.     Cervical back: Normal range of motion and neck supple.  Skin:    General: Skin is warm and dry.  Neurological:     General: No focal deficit present.     Mental Status: She is alert and oriented to person, place, and time.     ED Results / Procedures / Treatments   Labs (all labs ordered  are listed, but only abnormal results are displayed) Labs Reviewed  COMPREHENSIVE METABOLIC PANEL - Abnormal; Notable for the following components:      Result Value   Glucose, Bld 123 (*)    Creatinine, Ser 1.39 (*)    GFR calc non Af Amer 39 (*)    GFR calc Af Amer 46 (*)    All other components within normal limits  CBC - Abnormal; Notable for the following components:   RBC 3.32 (*)    Hemoglobin 9.9 (*)    HCT 30.5 (*)    All other components within normal limits  LIPASE, BLOOD  TROPONIN I (HIGH SENSITIVITY)  TROPONIN I (HIGH SENSITIVITY)    EKG EKG Interpretation  Date/Time:  Saturday November 12 2019 04:03:30 EST Ventricular Rate:  102 PR Interval:  154 QRS Duration: 90 QT Interval:  362 QTC Calculation: 471 R Axis:   -41 Text Interpretation: Sinus tachycardia Left axis deviation Cannot rule out Anterior infarct , age undetermined Abnormal ECG QT has shortened Confirmed by Pryor Curia [phone]) on 11/12/2019 5:27:05 AM   Radiology No results found.  Procedures Procedures (including critical care time)  Medications Ordered in ED Medications  chlorproMAZINE (THORAZINE) injection 25 mg (has no administration in time range)    ED Course  I have reviewed the triage vital signs and the nursing notes.  Pertinent labs & imaging results that were available during my care of the patient were reviewed by me and considered in my medical decision making (see chart for details).    MDM Rules/Calculators/A&P                      Patient to ED with emesis associated with hiccups since being discharged 3 days ago.   Labs reviewed and appear stable or improved from most recent on 11/09/19. Thorazine IM provided to the patient for control of hiccups. Will reassess.   6:30 - patient feels improved since Thorazine. No further hiccups or vomiting. She wants to be discharged home and I feel this is appropriate.   She complained of burning type chest pain on arrival. She has no  further pain. Troponin negative, EKG without ischemia. Doubt ACS. Feel  this is associated with multiple episodes emesis.  Final Clinical Impression(s) / ED Diagnoses Final diagnoses:  None   1. Hiccups  Rx / DC Orders ED Discharge Orders    None       Elpidio Anis, PA-C 11/12/19 0636    Ward, Layla Maw, DO 11/12/19 [phone]

## 2019-11-12 NOTE — ED Triage Notes (Signed)
Pt c/o hiccups and vomiting that started yesterday. Was recently d/c'd with colitis per pt. States taking abx.

## 2019-11-14 ENCOUNTER — Emergency Department (HOSPITAL_COMMUNITY)
Admission: EM | Admit: 2019-11-14 | Discharge: 2019-11-14 | Disposition: A | Discharge status: Home / Self Care | Payer: Medicare Other | Attending: Emergency Medicine | Admitting: Emergency Medicine

## 2019-11-14 ENCOUNTER — Other Ambulatory Visit: Payer: Self-pay

## 2019-11-14 DIAGNOSIS — R1110 Vomiting, unspecified: Secondary | ICD-10-CM | POA: Diagnosis not present

## 2019-11-14 DIAGNOSIS — R531 Weakness: Secondary | ICD-10-CM | POA: Diagnosis present

## 2019-11-14 DIAGNOSIS — Z5321 Procedure and treatment not carried out due to patient leaving prior to being seen by health care provider: Secondary | ICD-10-CM | POA: Diagnosis not present

## 2019-11-14 NOTE — ED Triage Notes (Signed)
Pt BIBA from home.   Per EMS- Pt c/o weakness, emesis x2 weeks.  Pt reports symptomatic fever that "broke yesterday."  Pt also c/o hiccups.

## 2019-11-14 NOTE — ED Notes (Signed)
Pt not seen in lobby when called for vitals or to collect labs.

## 2019-11-20 IMAGING — CR DG CHEST 2V
2 series · 2 of 2 positions shown · non-contrast
Comparison: March 19, 2017

CLINICAL DATA: Shortness of breath for 3 days.

EXAM:
CHEST - 2 VIEW

[chest pa]
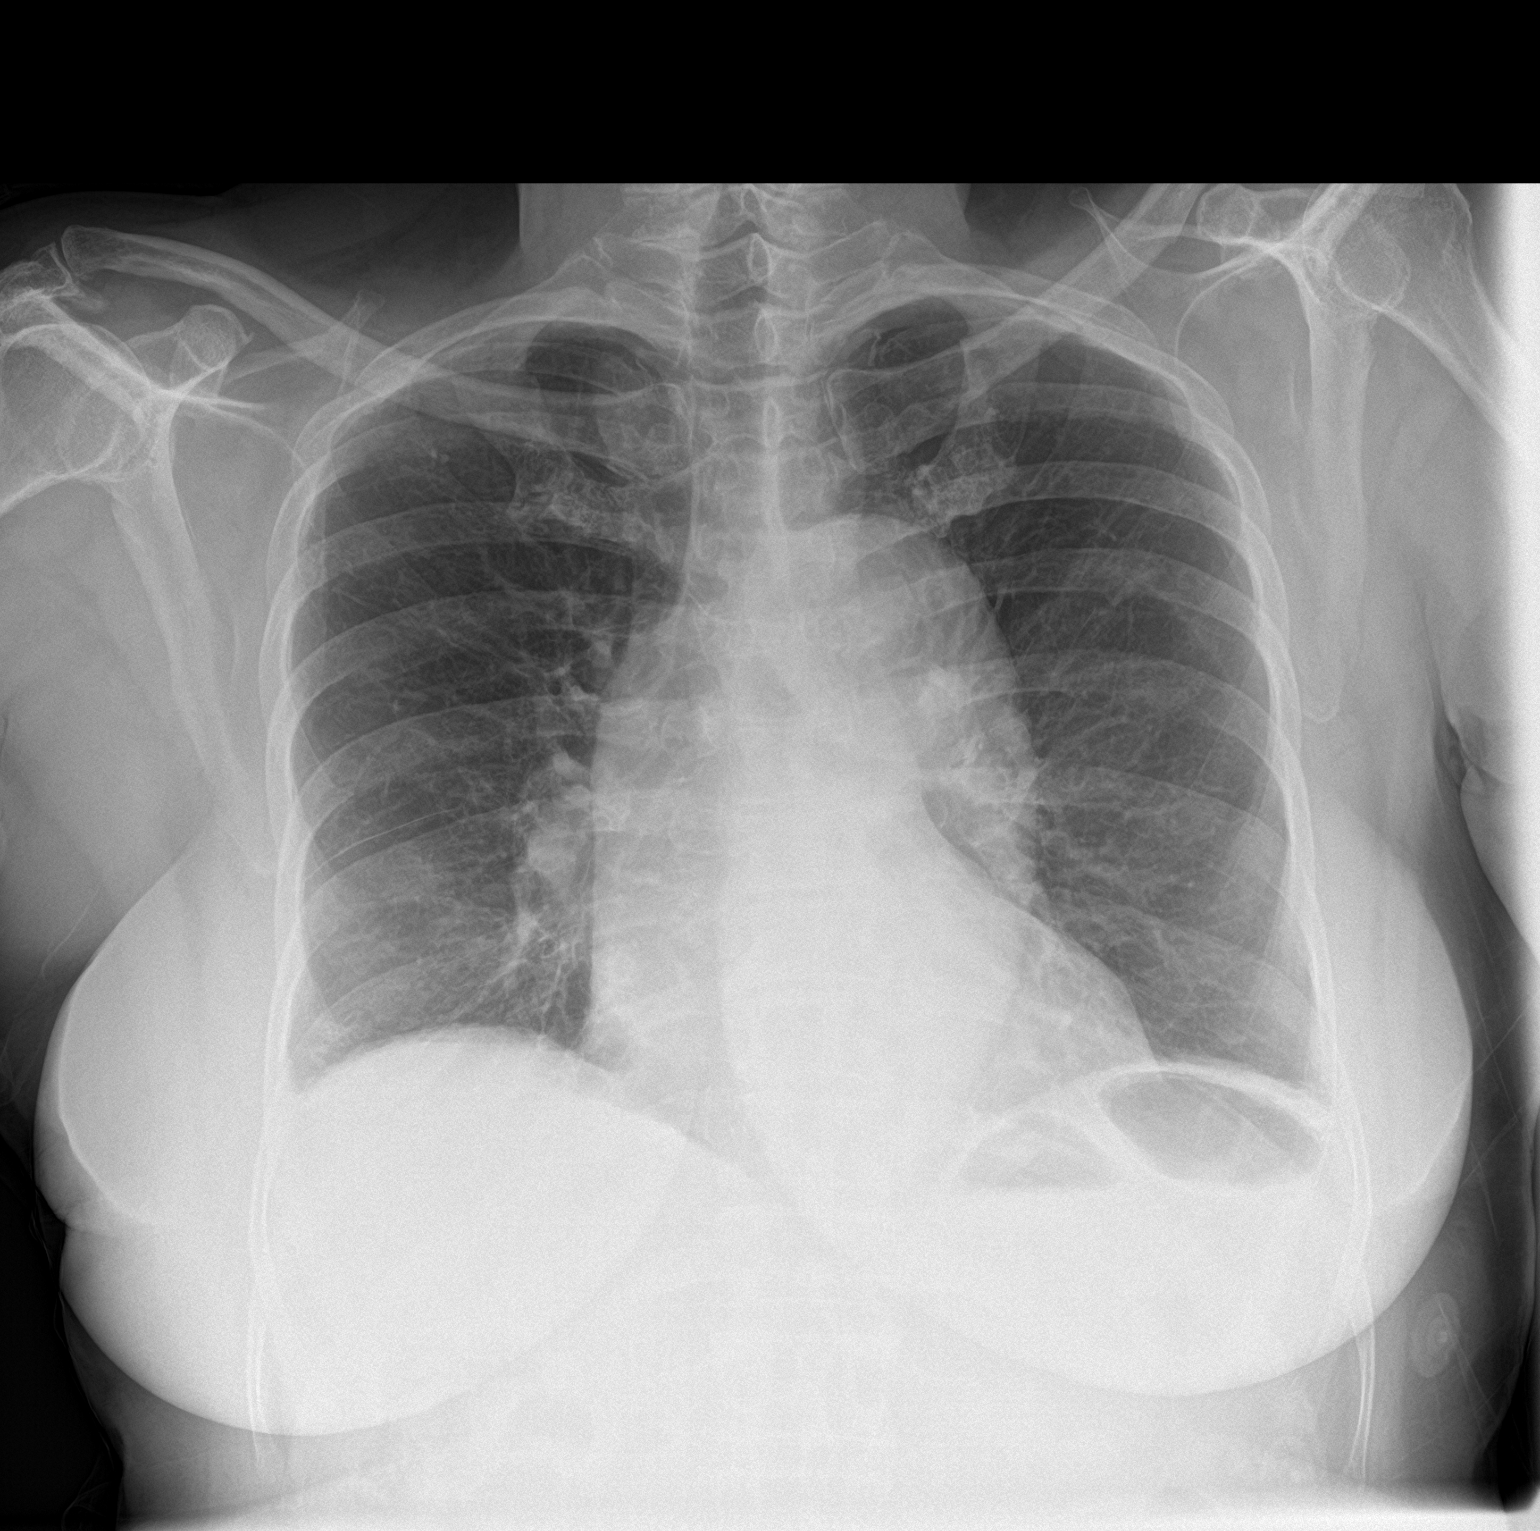

[chest lat]
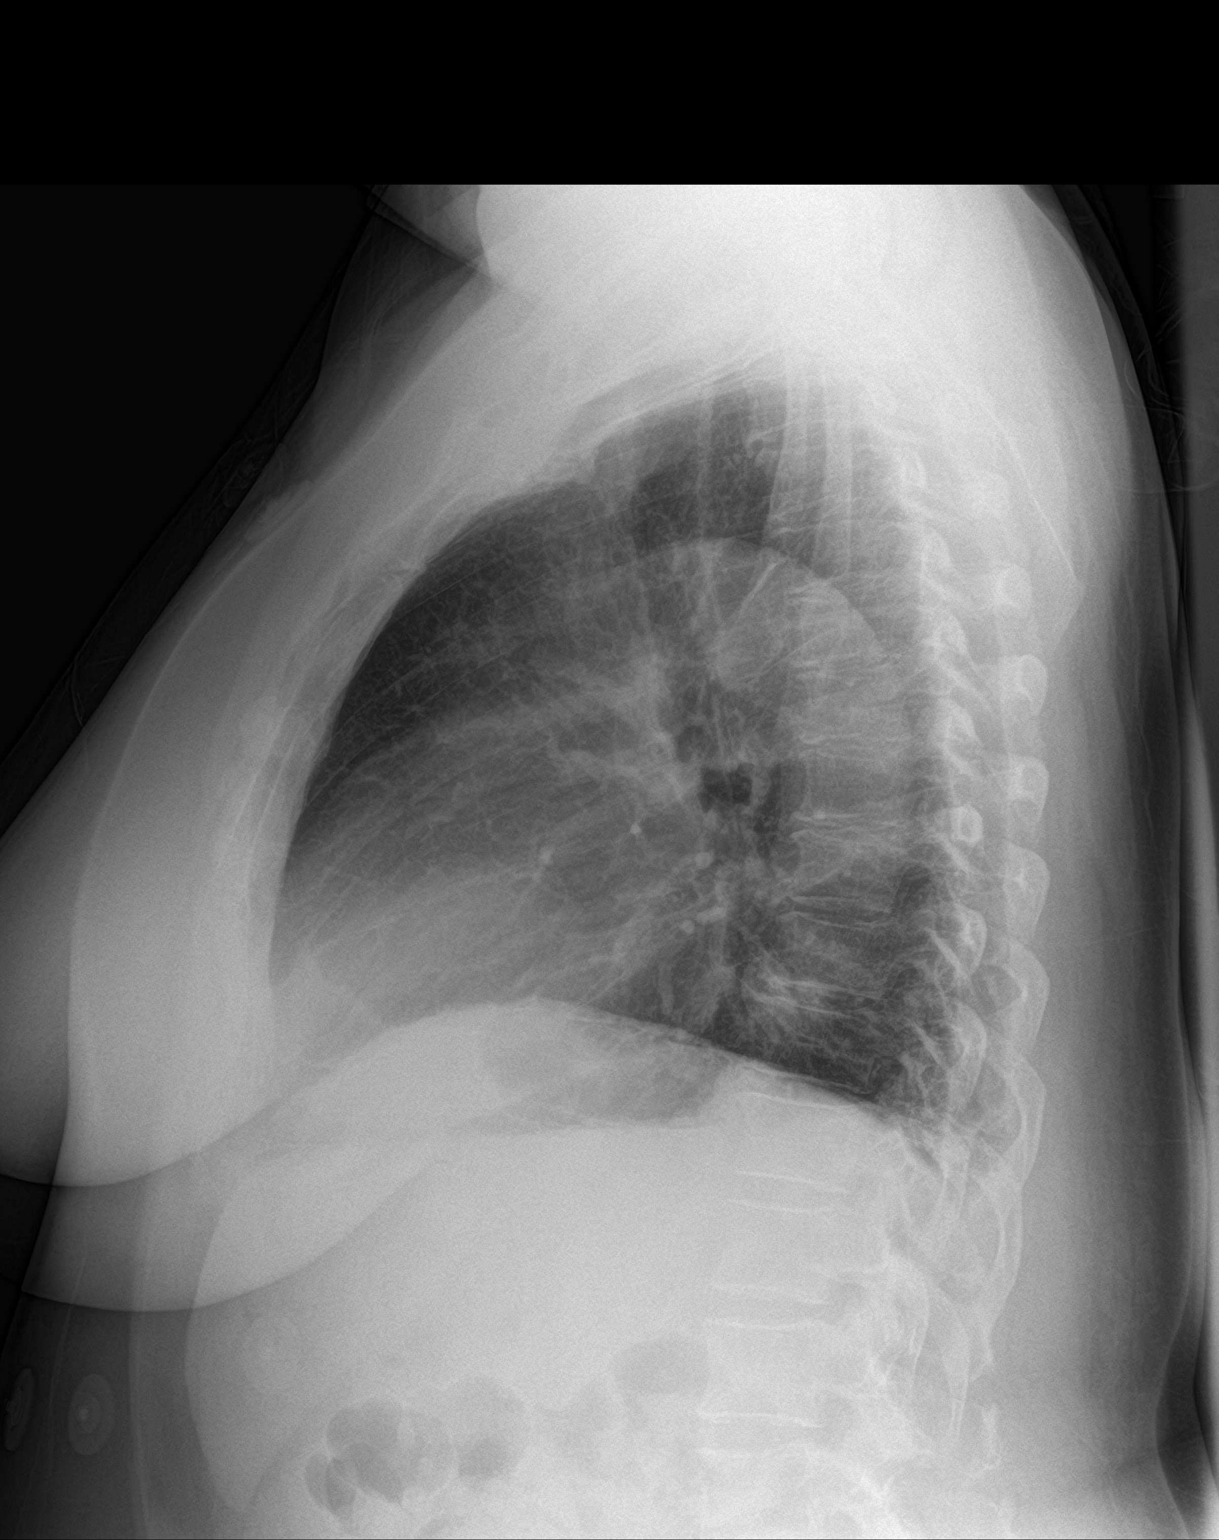

[2 of 2 positions shown; findings below may reference images not displayed]

FINDINGS: The heart size and mediastinal contours are stable. The aorta is
tortuous. There is no focal infiltrate, pulmonary edema, or pleural
effusion. The visualized skeletal structures are unremarkable.
IMPRESSION: No active cardiopulmonary disease.

## 2019-11-21 IMAGING — CT CT ANGIO CHEST
2 of 6 series · 18 of 36 positions shown · IV contrast (ISOVUE)
Comparison: 03/19/2017

CLINICAL DATA: Bilateral calf pain and dyspnea for the past several
weeks.

EXAM:
CT ANGIOGRAPHY CHEST WITH CONTRAST
TECHNIQUE: Multidetector CT imaging of the chest was performed using the
standard protocol during bolus administration of intravenous
contrast. Multiplanar CT image reconstructions and MIPs were
obtained to evaluate the vascular anatomy.
CONTRAST:  75mL [id number] IOPAMIDOL ([id number]) INJECTION 76%

[Series 5: thins · axial · 0.63mm/px · z∈[-50,+190]mm · 17 of 267 slices shown]
[im 14/267  lung]
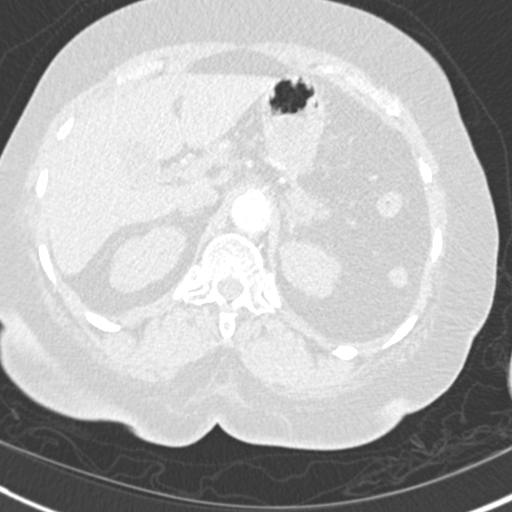
[im 27/267  mediastinal]
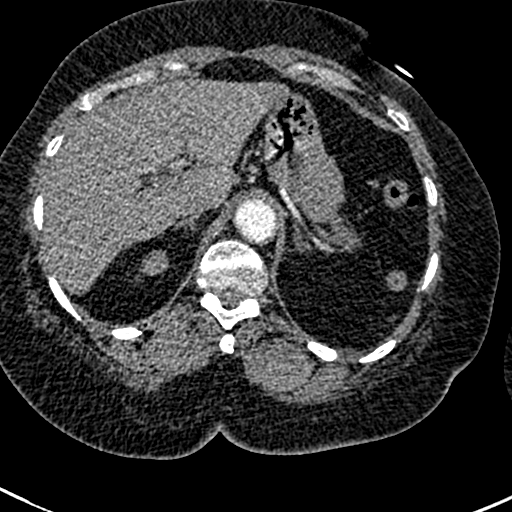
[im 40/267  lung]
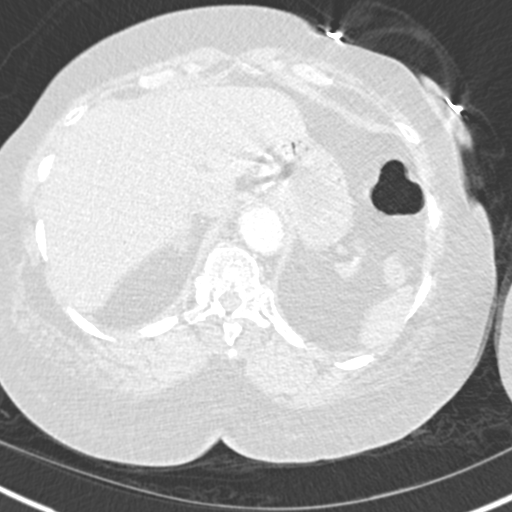
[im 54/267  mediastinal]
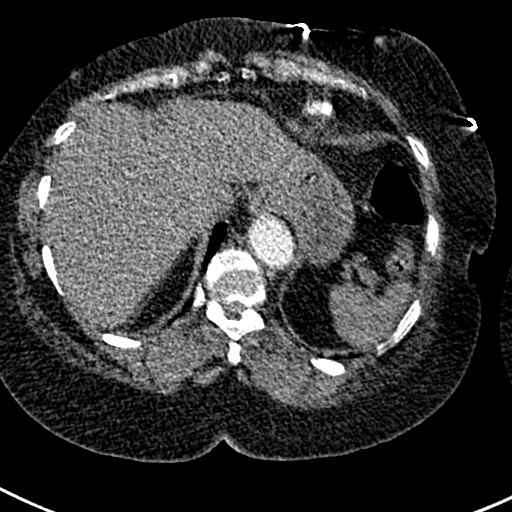
[im 80/267  lung]
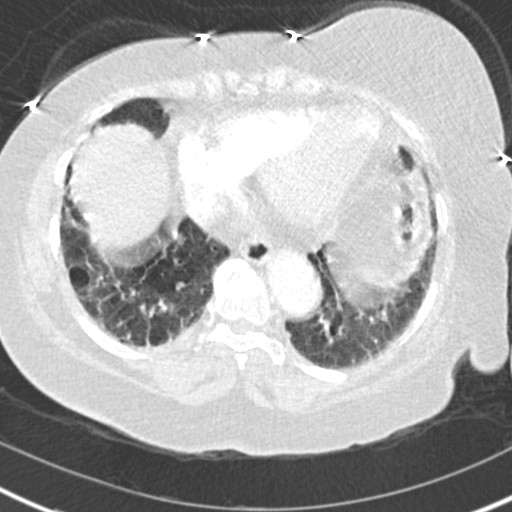
[im 94/267  mediastinal]
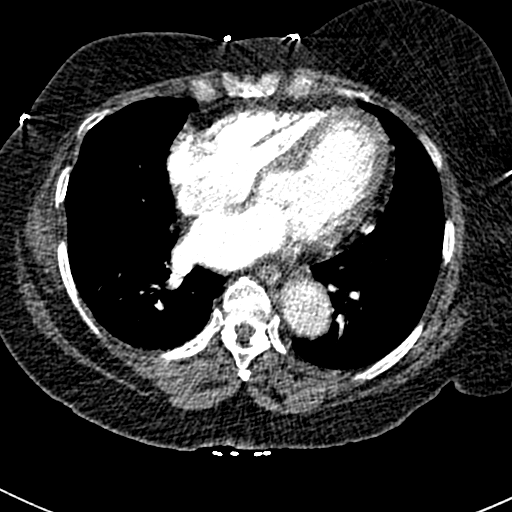
[im 107/267  lung]
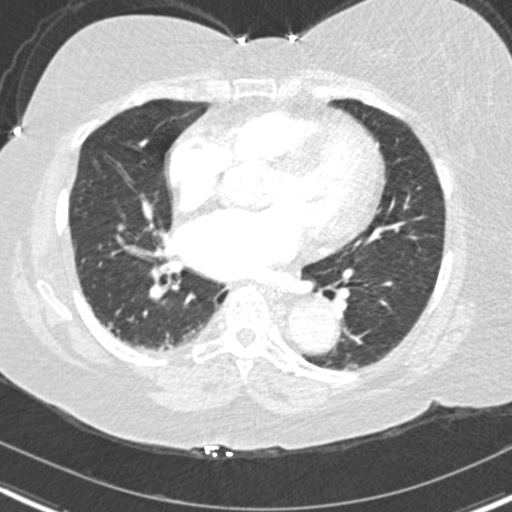
[im 120/267  mediastinal]
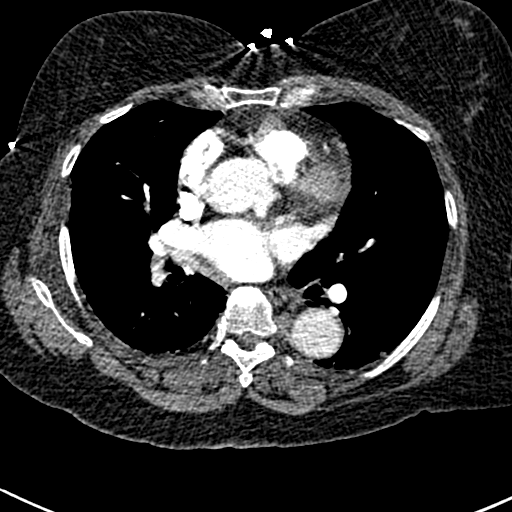
[im 134/267  lung]
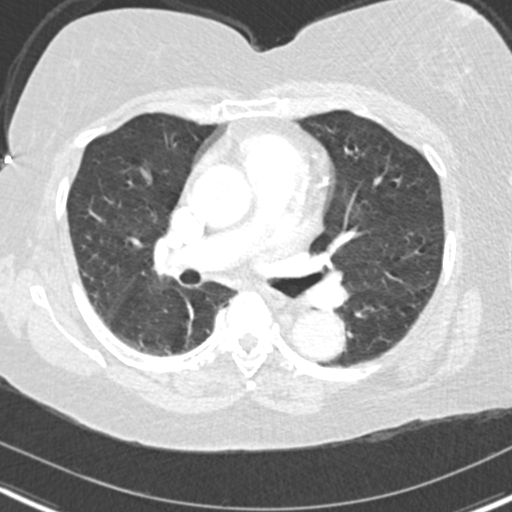
[im 147/267  mediastinal]
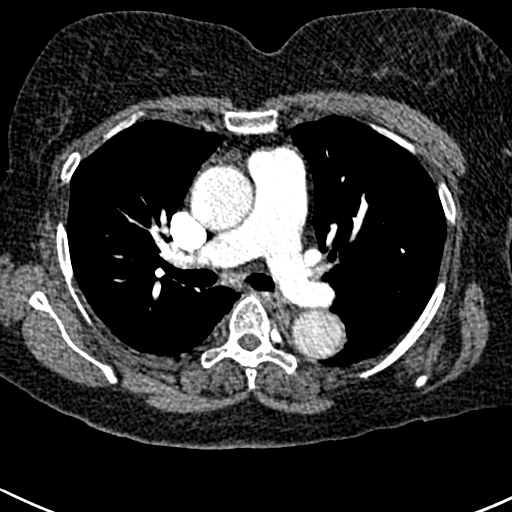
[im 160/267  lung]
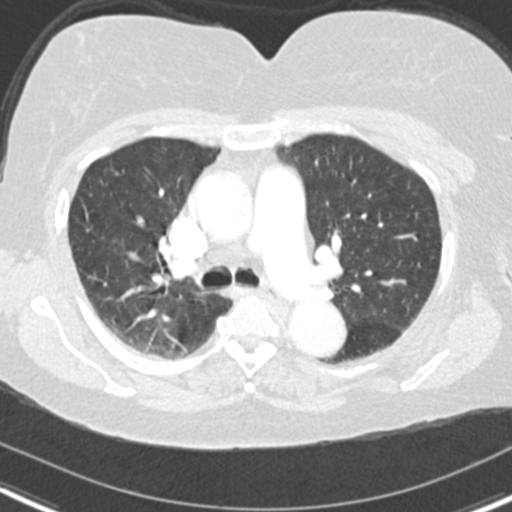
[im 173/267  mediastinal]
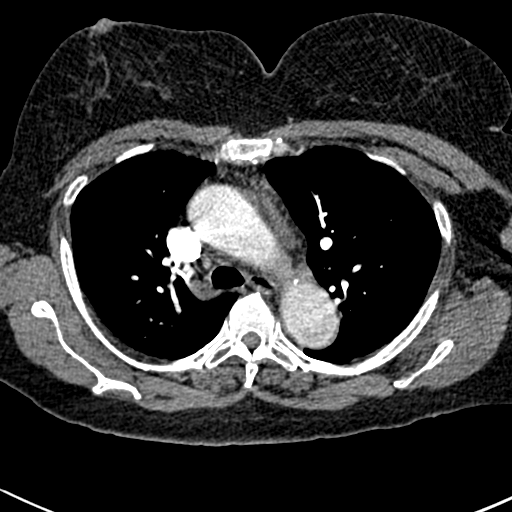
[im 187/267  lung]
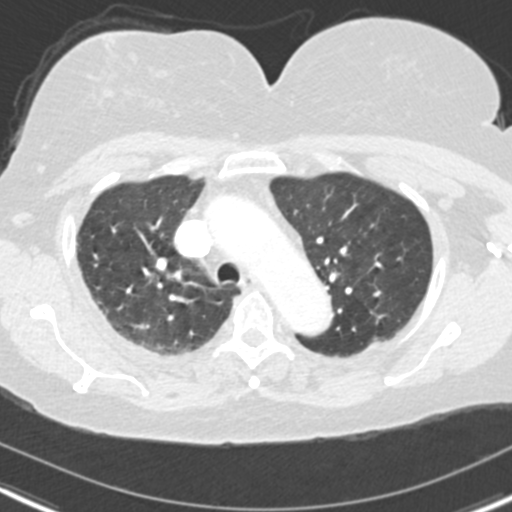
[im 213/267  mediastinal]
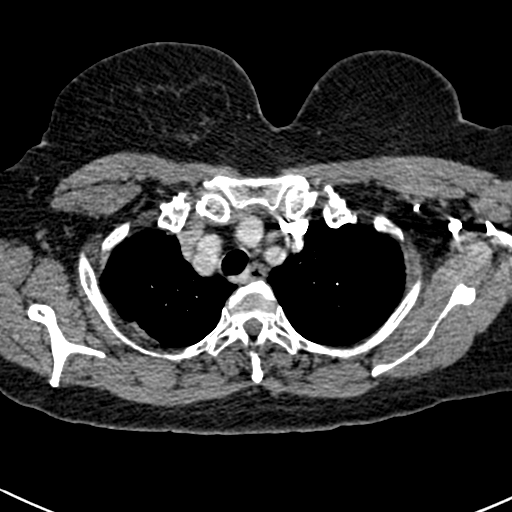
[im 227/267  lung]
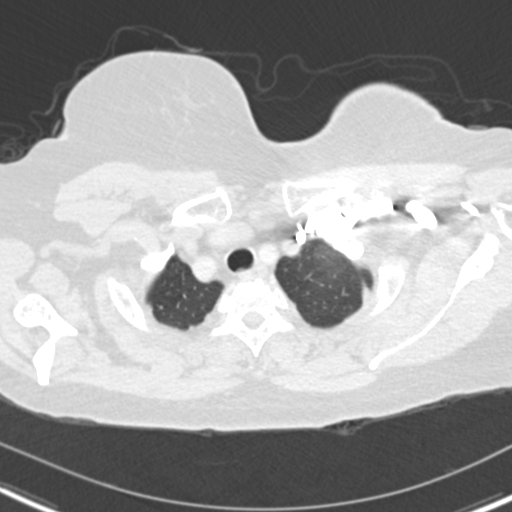
[im 240/267  mediastinal]
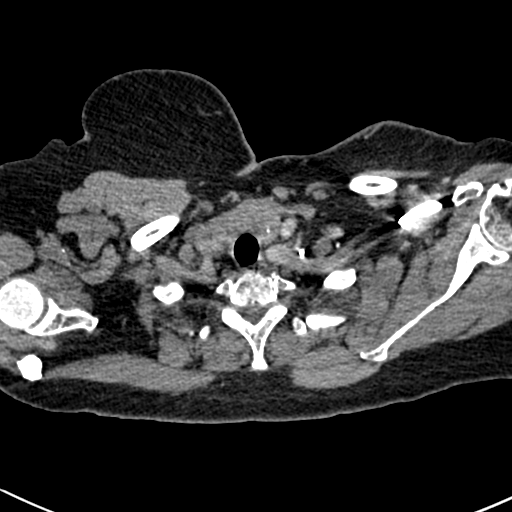
[im 253/267  lung]
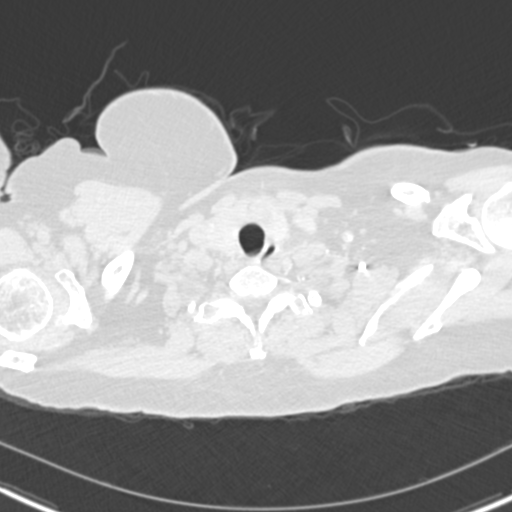

[Series 7: coronal mpr · coronal · 0.51mm/px · 1 of 107 slices shown]
[im 54/107  mediastinal]
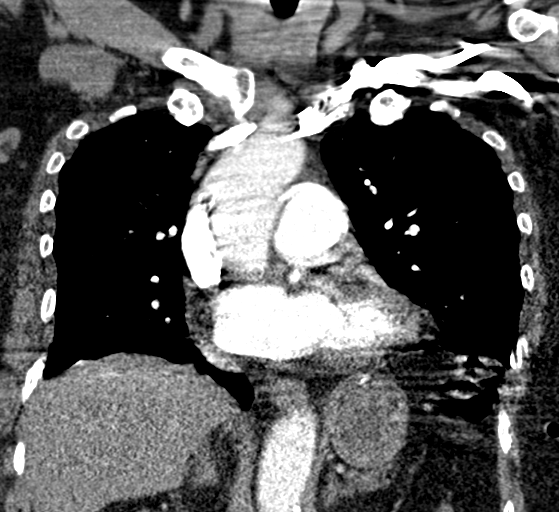

[18 of 36 positions shown; findings below may reference images not displayed]

FINDINGS: Cardiovascular: The study is of quality for the evaluation of
pulmonary embolism. There are no filling defects in the central,
lobar, segmental or subsegmental pulmonary artery branches to
suggest acute pulmonary embolism. Common arterial trunk of the right
brachiocephalic and left common carotid arteries, an anatomic
variant. No significant stenosis of the great vessels. Normal heart
size. No significant pericardial fluid/thickening. 4 cm ascending
thoracic aortic aneurysm. No dissection. Coronary arteriosclerosis
is noted along the LAD and diagonals.

Mediastinum/Nodes: Mild thyromegaly with heterogeneous enhancement
of the thyroid gland. 9 mm cystic nodule of the left thyroid gland,
too small to further characterize. Unremarkable esophagus. No
pathologically enlarged axillary, mediastinal or hilar lymph nodes.

Lungs/Pleura: No pneumothorax. No pleural effusion. Subpleural
atelectasis and/or scarring bilaterally. 3.5 mm in average
subpleural right lower lobe pulmonary nodule, series [DATE]. No
effusion or pneumothorax.

Upper abdomen: Unremarkable.

Musculoskeletal:  No aggressive appearing focal osseous lesions.

Review of the MIP images confirms the above findings.
IMPRESSION: 1. 4 cm ascending thoracic aortic aneurysm. Recommend annual imaging
followup by CTA or MRA. This recommendation follows 3343
ACCF/AHA/AATS/ACR/ASA/SCA/MOSTEPHA/QENA/EATON/EVA Guidelines for the
Diagnosis and Management of Patients with Thoracic Aortic Disease.
Circulation. 3343; 121: [id number]
2. No aortic dissection.
3. No acute pulmonary embolus.
4. Coronary arteriosclerosis.
5. 3.5 mm in average right lower lobe pulmonary nodule. No follow-up
needed if patient is low-risk. Non-contrast chest CT can be
considered in 12 months if patient is high-risk. This recommendation
follows the consensus statement: Guidelines for Management of
Incidental Pulmonary Nodules Detected on CT Images: From the

Aortic aneurysm NOS ([id number]).

## 2019-11-28 DIAGNOSIS — I6389 Other cerebral infarction: Secondary | ICD-10-CM | POA: Diagnosis not present

## 2019-11-28 DIAGNOSIS — M6281 Muscle weakness (generalized): Secondary | ICD-10-CM | POA: Diagnosis not present

## 2019-11-28 DIAGNOSIS — R0989 Other specified symptoms and signs involving the circulatory and respiratory systems: Secondary | ICD-10-CM | POA: Diagnosis not present

## 2019-11-28 DIAGNOSIS — R2681 Unsteadiness on feet: Secondary | ICD-10-CM | POA: Diagnosis not present

## 2019-11-28 DIAGNOSIS — Z03818 Encounter for observation for suspected exposure to other biological agents ruled out: Secondary | ICD-10-CM | POA: Diagnosis not present

## 2019-11-28 DIAGNOSIS — M069 Rheumatoid arthritis, unspecified: Secondary | ICD-10-CM | POA: Diagnosis not present

## 2019-11-28 DIAGNOSIS — G478 Other sleep disorders: Secondary | ICD-10-CM | POA: Diagnosis not present

## 2019-11-28 DIAGNOSIS — K529 Noninfective gastroenteritis and colitis, unspecified: Secondary | ICD-10-CM | POA: Diagnosis not present

## 2019-11-28 DIAGNOSIS — I129 Hypertensive chronic kidney disease with stage 1 through stage 4 chronic kidney disease, or unspecified chronic kidney disease: Secondary | ICD-10-CM | POA: Diagnosis not present

## 2019-11-28 DIAGNOSIS — I82402 Acute embolism and thrombosis of unspecified deep veins of left lower extremity: Secondary | ICD-10-CM | POA: Diagnosis not present

## 2019-11-28 DIAGNOSIS — R531 Weakness: Secondary | ICD-10-CM | POA: Diagnosis not present

## 2019-11-28 DIAGNOSIS — N189 Chronic kidney disease, unspecified: Secondary | ICD-10-CM | POA: Diagnosis not present

## 2019-11-28 DIAGNOSIS — I69398 Other sequelae of cerebral infarction: Secondary | ICD-10-CM | POA: Diagnosis not present

## 2019-11-28 DIAGNOSIS — I69354 Hemiplegia and hemiparesis following cerebral infarction affecting left non-dominant side: Secondary | ICD-10-CM | POA: Diagnosis not present

## 2019-11-29 DIAGNOSIS — M069 Rheumatoid arthritis, unspecified: Secondary | ICD-10-CM | POA: Diagnosis not present

## 2019-11-29 DIAGNOSIS — K529 Noninfective gastroenteritis and colitis, unspecified: Secondary | ICD-10-CM | POA: Diagnosis not present

## 2019-11-29 DIAGNOSIS — I69354 Hemiplegia and hemiparesis following cerebral infarction affecting left non-dominant side: Secondary | ICD-10-CM | POA: Diagnosis not present

## 2019-11-29 DIAGNOSIS — R531 Weakness: Secondary | ICD-10-CM | POA: Diagnosis not present

## 2019-12-01 DIAGNOSIS — R0989 Other specified symptoms and signs involving the circulatory and respiratory systems: Secondary | ICD-10-CM | POA: Diagnosis not present

## 2019-12-01 DIAGNOSIS — I6389 Other cerebral infarction: Secondary | ICD-10-CM | POA: Diagnosis not present

## 2019-12-01 DIAGNOSIS — I82402 Acute embolism and thrombosis of unspecified deep veins of left lower extremity: Secondary | ICD-10-CM | POA: Diagnosis not present

## 2019-12-01 DIAGNOSIS — G478 Other sleep disorders: Secondary | ICD-10-CM | POA: Diagnosis not present

## 2019-12-01 DIAGNOSIS — R531 Weakness: Secondary | ICD-10-CM | POA: Diagnosis not present

## 2019-12-07 DIAGNOSIS — M4726 Other spondylosis with radiculopathy, lumbar region: Secondary | ICD-10-CM | POA: Diagnosis not present

## 2019-12-07 DIAGNOSIS — M48061 Spinal stenosis, lumbar region without neurogenic claudication: Secondary | ICD-10-CM | POA: Diagnosis not present

## 2019-12-07 DIAGNOSIS — M4316 Spondylolisthesis, lumbar region: Secondary | ICD-10-CM | POA: Diagnosis not present

## 2019-12-07 DIAGNOSIS — M545 Low back pain: Secondary | ICD-10-CM | POA: Diagnosis not present

## 2019-12-07 DIAGNOSIS — M5136 Other intervertebral disc degeneration, lumbar region: Secondary | ICD-10-CM | POA: Diagnosis not present

## 2019-12-08 DIAGNOSIS — I129 Hypertensive chronic kidney disease with stage 1 through stage 4 chronic kidney disease, or unspecified chronic kidney disease: Secondary | ICD-10-CM | POA: Diagnosis not present

## 2019-12-08 DIAGNOSIS — D631 Anemia in chronic kidney disease: Secondary | ICD-10-CM | POA: Diagnosis not present

## 2019-12-08 DIAGNOSIS — M3500 Sicca syndrome, unspecified: Secondary | ICD-10-CM | POA: Diagnosis not present

## 2019-12-08 DIAGNOSIS — M059 Rheumatoid arthritis with rheumatoid factor, unspecified: Secondary | ICD-10-CM | POA: Diagnosis not present

## 2019-12-08 DIAGNOSIS — I69311 Memory deficit following cerebral infarction: Secondary | ICD-10-CM | POA: Diagnosis not present

## 2019-12-08 DIAGNOSIS — I69354 Hemiplegia and hemiparesis following cerebral infarction affecting left non-dominant side: Secondary | ICD-10-CM | POA: Diagnosis not present

## 2019-12-08 DIAGNOSIS — N183 Chronic kidney disease, stage 3 unspecified: Secondary | ICD-10-CM | POA: Diagnosis not present

## 2019-12-13 DIAGNOSIS — N183 Chronic kidney disease, stage 3 unspecified: Secondary | ICD-10-CM | POA: Diagnosis not present

## 2019-12-13 DIAGNOSIS — M059 Rheumatoid arthritis with rheumatoid factor, unspecified: Secondary | ICD-10-CM | POA: Diagnosis not present

## 2019-12-13 DIAGNOSIS — I69311 Memory deficit following cerebral infarction: Secondary | ICD-10-CM | POA: Diagnosis not present

## 2019-12-13 DIAGNOSIS — I69354 Hemiplegia and hemiparesis following cerebral infarction affecting left non-dominant side: Secondary | ICD-10-CM | POA: Diagnosis not present

## 2019-12-13 DIAGNOSIS — M3500 Sicca syndrome, unspecified: Secondary | ICD-10-CM | POA: Diagnosis not present

## 2019-12-13 DIAGNOSIS — D631 Anemia in chronic kidney disease: Secondary | ICD-10-CM | POA: Diagnosis not present

## 2019-12-13 DIAGNOSIS — I129 Hypertensive chronic kidney disease with stage 1 through stage 4 chronic kidney disease, or unspecified chronic kidney disease: Secondary | ICD-10-CM | POA: Diagnosis not present

## 2019-12-15 DIAGNOSIS — R933 Abnormal findings on diagnostic imaging of other parts of digestive tract: Secondary | ICD-10-CM | POA: Diagnosis not present

## 2019-12-15 DIAGNOSIS — R7989 Other specified abnormal findings of blood chemistry: Secondary | ICD-10-CM | POA: Diagnosis not present

## 2019-12-15 DIAGNOSIS — Z8719 Personal history of other diseases of the digestive system: Secondary | ICD-10-CM | POA: Diagnosis not present

## 2019-12-15 DIAGNOSIS — R410 Disorientation, unspecified: Secondary | ICD-10-CM | POA: Diagnosis not present

## 2019-12-15 DIAGNOSIS — Z09 Encounter for follow-up examination after completed treatment for conditions other than malignant neoplasm: Secondary | ICD-10-CM | POA: Diagnosis not present

## 2019-12-15 DIAGNOSIS — Z86718 Personal history of other venous thrombosis and embolism: Secondary | ICD-10-CM | POA: Diagnosis not present

## 2019-12-15 DIAGNOSIS — I82412 Acute embolism and thrombosis of left femoral vein: Secondary | ICD-10-CM | POA: Diagnosis not present

## 2019-12-15 DIAGNOSIS — K219 Gastro-esophageal reflux disease without esophagitis: Secondary | ICD-10-CM | POA: Diagnosis not present

## 2019-12-19 DIAGNOSIS — I69354 Hemiplegia and hemiparesis following cerebral infarction affecting left non-dominant side: Secondary | ICD-10-CM | POA: Diagnosis not present

## 2019-12-19 DIAGNOSIS — I129 Hypertensive chronic kidney disease with stage 1 through stage 4 chronic kidney disease, or unspecified chronic kidney disease: Secondary | ICD-10-CM | POA: Diagnosis not present

## 2019-12-19 DIAGNOSIS — I69311 Memory deficit following cerebral infarction: Secondary | ICD-10-CM | POA: Diagnosis not present

## 2019-12-19 DIAGNOSIS — M059 Rheumatoid arthritis with rheumatoid factor, unspecified: Secondary | ICD-10-CM | POA: Diagnosis not present

## 2019-12-19 DIAGNOSIS — M3500 Sicca syndrome, unspecified: Secondary | ICD-10-CM | POA: Diagnosis not present

## 2019-12-19 DIAGNOSIS — N183 Chronic kidney disease, stage 3 unspecified: Secondary | ICD-10-CM | POA: Diagnosis not present

## 2019-12-19 DIAGNOSIS — D631 Anemia in chronic kidney disease: Secondary | ICD-10-CM | POA: Diagnosis not present

## 2019-12-20 DIAGNOSIS — D631 Anemia in chronic kidney disease: Secondary | ICD-10-CM | POA: Diagnosis not present

## 2019-12-20 DIAGNOSIS — I69354 Hemiplegia and hemiparesis following cerebral infarction affecting left non-dominant side: Secondary | ICD-10-CM | POA: Diagnosis not present

## 2019-12-20 DIAGNOSIS — M3500 Sicca syndrome, unspecified: Secondary | ICD-10-CM | POA: Diagnosis not present

## 2019-12-20 DIAGNOSIS — M059 Rheumatoid arthritis with rheumatoid factor, unspecified: Secondary | ICD-10-CM | POA: Diagnosis not present

## 2019-12-20 DIAGNOSIS — I129 Hypertensive chronic kidney disease with stage 1 through stage 4 chronic kidney disease, or unspecified chronic kidney disease: Secondary | ICD-10-CM | POA: Diagnosis not present

## 2019-12-20 DIAGNOSIS — I69311 Memory deficit following cerebral infarction: Secondary | ICD-10-CM | POA: Diagnosis not present

## 2019-12-20 DIAGNOSIS — N183 Chronic kidney disease, stage 3 unspecified: Secondary | ICD-10-CM | POA: Diagnosis not present

## 2019-12-21 DIAGNOSIS — M5136 Other intervertebral disc degeneration, lumbar region: Secondary | ICD-10-CM | POA: Diagnosis not present

## 2019-12-21 DIAGNOSIS — Z79891 Long term (current) use of opiate analgesic: Secondary | ICD-10-CM | POA: Diagnosis not present

## 2019-12-21 DIAGNOSIS — M4726 Other spondylosis with radiculopathy, lumbar region: Secondary | ICD-10-CM | POA: Diagnosis not present

## 2019-12-21 DIAGNOSIS — M545 Low back pain: Secondary | ICD-10-CM | POA: Diagnosis not present

## 2019-12-21 DIAGNOSIS — M4316 Spondylolisthesis, lumbar region: Secondary | ICD-10-CM | POA: Diagnosis not present

## 2019-12-22 DIAGNOSIS — M059 Rheumatoid arthritis with rheumatoid factor, unspecified: Secondary | ICD-10-CM | POA: Diagnosis not present

## 2019-12-22 DIAGNOSIS — D631 Anemia in chronic kidney disease: Secondary | ICD-10-CM | POA: Diagnosis not present

## 2019-12-22 DIAGNOSIS — I69311 Memory deficit following cerebral infarction: Secondary | ICD-10-CM | POA: Diagnosis not present

## 2019-12-22 DIAGNOSIS — N183 Chronic kidney disease, stage 3 unspecified: Secondary | ICD-10-CM | POA: Diagnosis not present

## 2019-12-22 DIAGNOSIS — I129 Hypertensive chronic kidney disease with stage 1 through stage 4 chronic kidney disease, or unspecified chronic kidney disease: Secondary | ICD-10-CM | POA: Diagnosis not present

## 2019-12-22 DIAGNOSIS — M3500 Sicca syndrome, unspecified: Secondary | ICD-10-CM | POA: Diagnosis not present

## 2019-12-22 DIAGNOSIS — I69354 Hemiplegia and hemiparesis following cerebral infarction affecting left non-dominant side: Secondary | ICD-10-CM | POA: Diagnosis not present

## 2019-12-23 DIAGNOSIS — I129 Hypertensive chronic kidney disease with stage 1 through stage 4 chronic kidney disease, or unspecified chronic kidney disease: Secondary | ICD-10-CM | POA: Diagnosis not present

## 2019-12-23 DIAGNOSIS — N183 Chronic kidney disease, stage 3 unspecified: Secondary | ICD-10-CM | POA: Diagnosis not present

## 2019-12-23 DIAGNOSIS — I69354 Hemiplegia and hemiparesis following cerebral infarction affecting left non-dominant side: Secondary | ICD-10-CM | POA: Diagnosis not present

## 2019-12-23 DIAGNOSIS — M3500 Sicca syndrome, unspecified: Secondary | ICD-10-CM | POA: Diagnosis not present

## 2019-12-23 DIAGNOSIS — D631 Anemia in chronic kidney disease: Secondary | ICD-10-CM | POA: Diagnosis not present

## 2019-12-23 DIAGNOSIS — I69311 Memory deficit following cerebral infarction: Secondary | ICD-10-CM | POA: Diagnosis not present

## 2019-12-23 DIAGNOSIS — M059 Rheumatoid arthritis with rheumatoid factor, unspecified: Secondary | ICD-10-CM | POA: Diagnosis not present

## 2019-12-28 DIAGNOSIS — M4726 Other spondylosis with radiculopathy, lumbar region: Secondary | ICD-10-CM | POA: Diagnosis not present

## 2019-12-28 DIAGNOSIS — M48061 Spinal stenosis, lumbar region without neurogenic claudication: Secondary | ICD-10-CM | POA: Diagnosis not present

## 2019-12-28 DIAGNOSIS — M5136 Other intervertebral disc degeneration, lumbar region: Secondary | ICD-10-CM | POA: Diagnosis not present

## 2019-12-29 DIAGNOSIS — I69354 Hemiplegia and hemiparesis following cerebral infarction affecting left non-dominant side: Secondary | ICD-10-CM | POA: Diagnosis not present

## 2019-12-29 DIAGNOSIS — D631 Anemia in chronic kidney disease: Secondary | ICD-10-CM | POA: Diagnosis not present

## 2019-12-29 DIAGNOSIS — M059 Rheumatoid arthritis with rheumatoid factor, unspecified: Secondary | ICD-10-CM | POA: Diagnosis not present

## 2019-12-29 DIAGNOSIS — I69311 Memory deficit following cerebral infarction: Secondary | ICD-10-CM | POA: Diagnosis not present

## 2019-12-29 DIAGNOSIS — I129 Hypertensive chronic kidney disease with stage 1 through stage 4 chronic kidney disease, or unspecified chronic kidney disease: Secondary | ICD-10-CM | POA: Diagnosis not present

## 2019-12-29 DIAGNOSIS — M3500 Sicca syndrome, unspecified: Secondary | ICD-10-CM | POA: Diagnosis not present

## 2019-12-29 DIAGNOSIS — N183 Chronic kidney disease, stage 3 unspecified: Secondary | ICD-10-CM | POA: Diagnosis not present

## 2020-01-02 IMAGING — CR DG KNEE COMPLETE 4+V*R*
4 series · 4 of 4 positions shown · non-contrast
Comparison: 03/31/2016

CLINICAL DATA: Chronic BILATERAL knee pain for 2 years LEFT greater
than RIGHT, no trauma

EXAM:
RIGHT KNEE - COMPLETE 4+ VIEW

[w knee ap right]
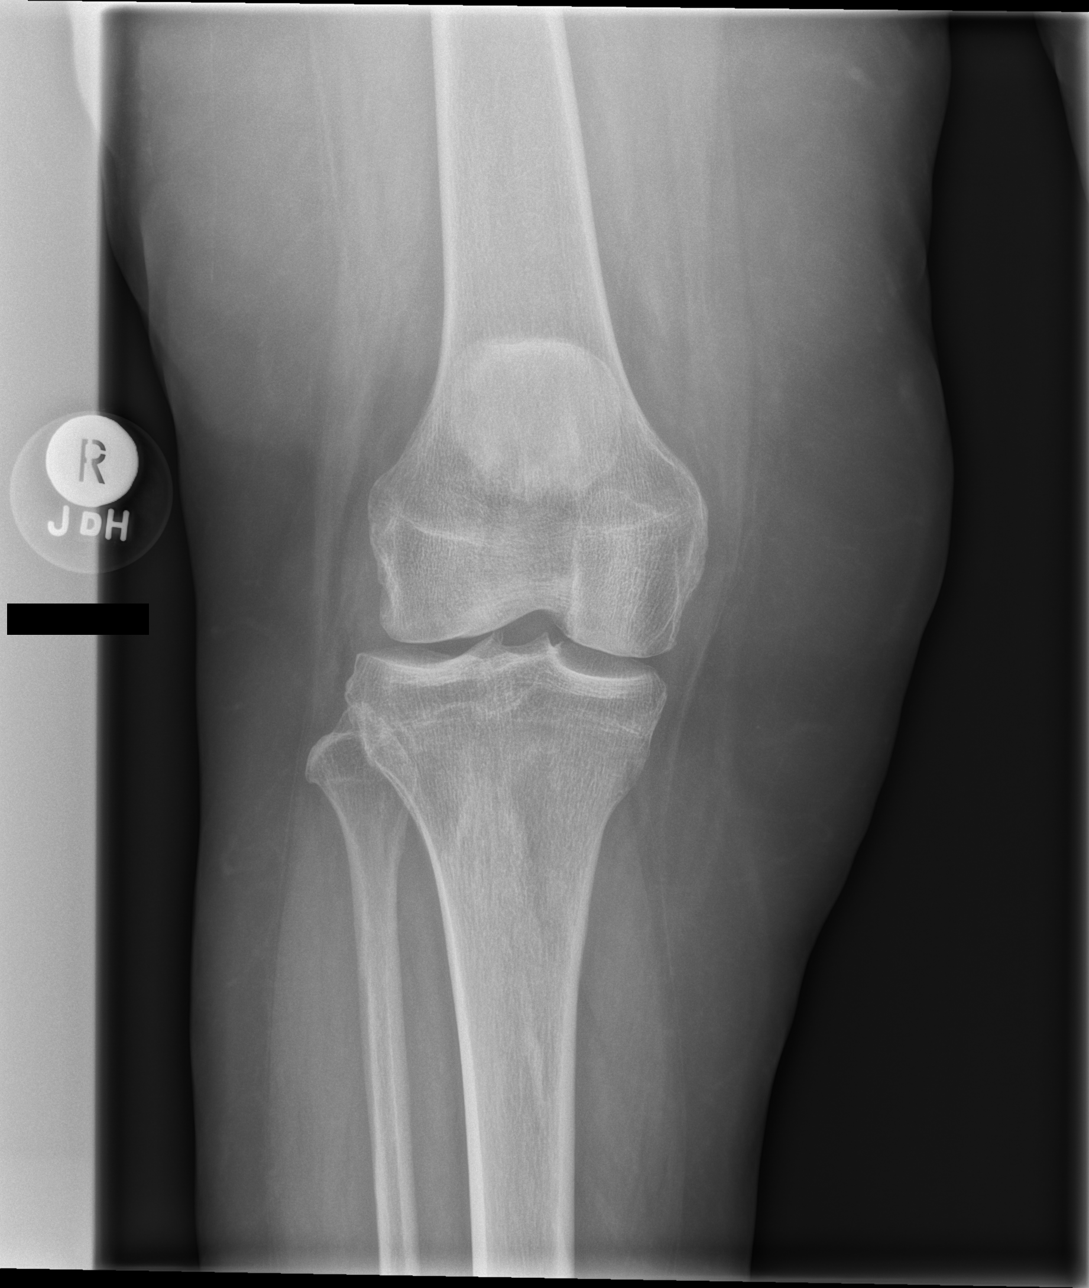

[w knee lat right]
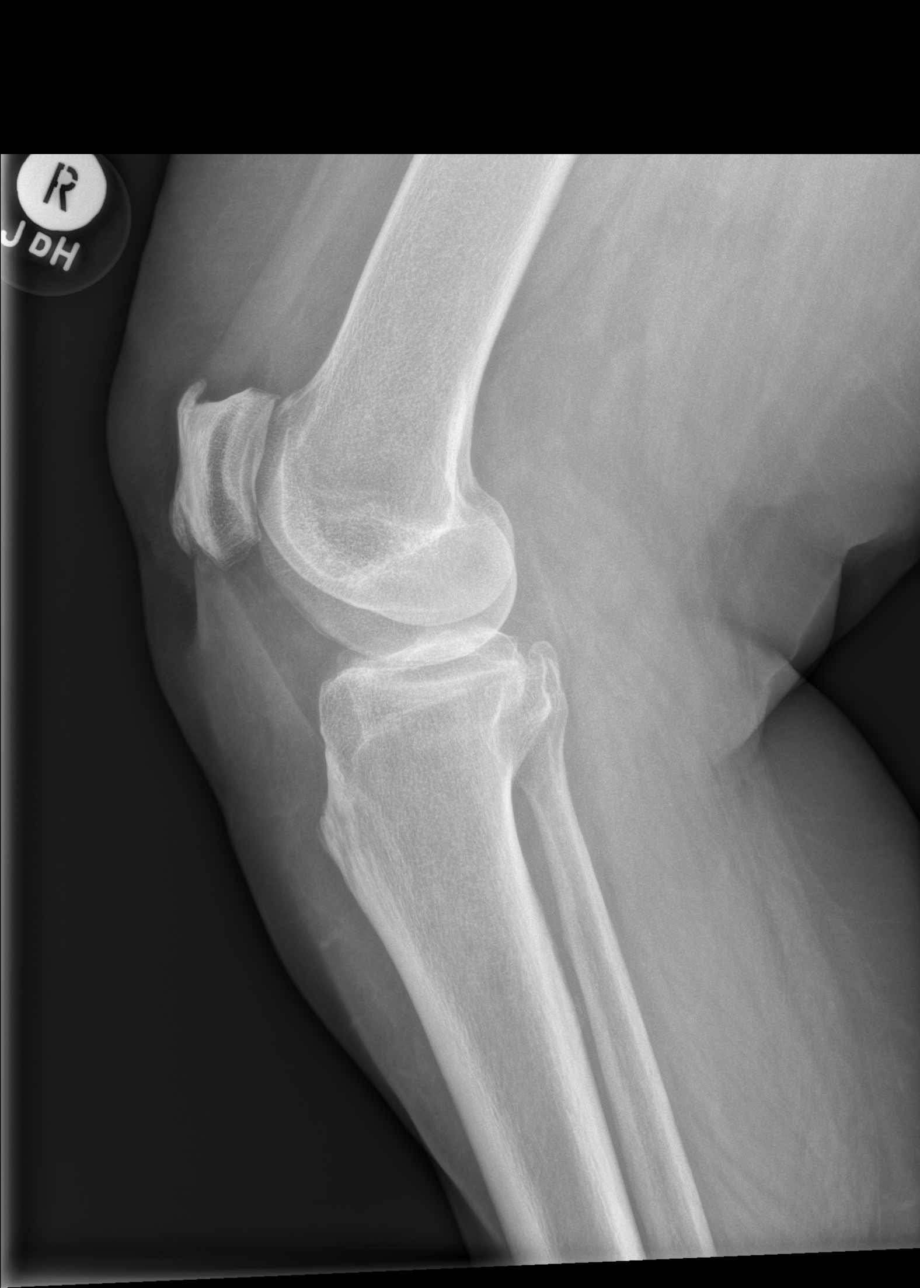

[x knee tunnel right]
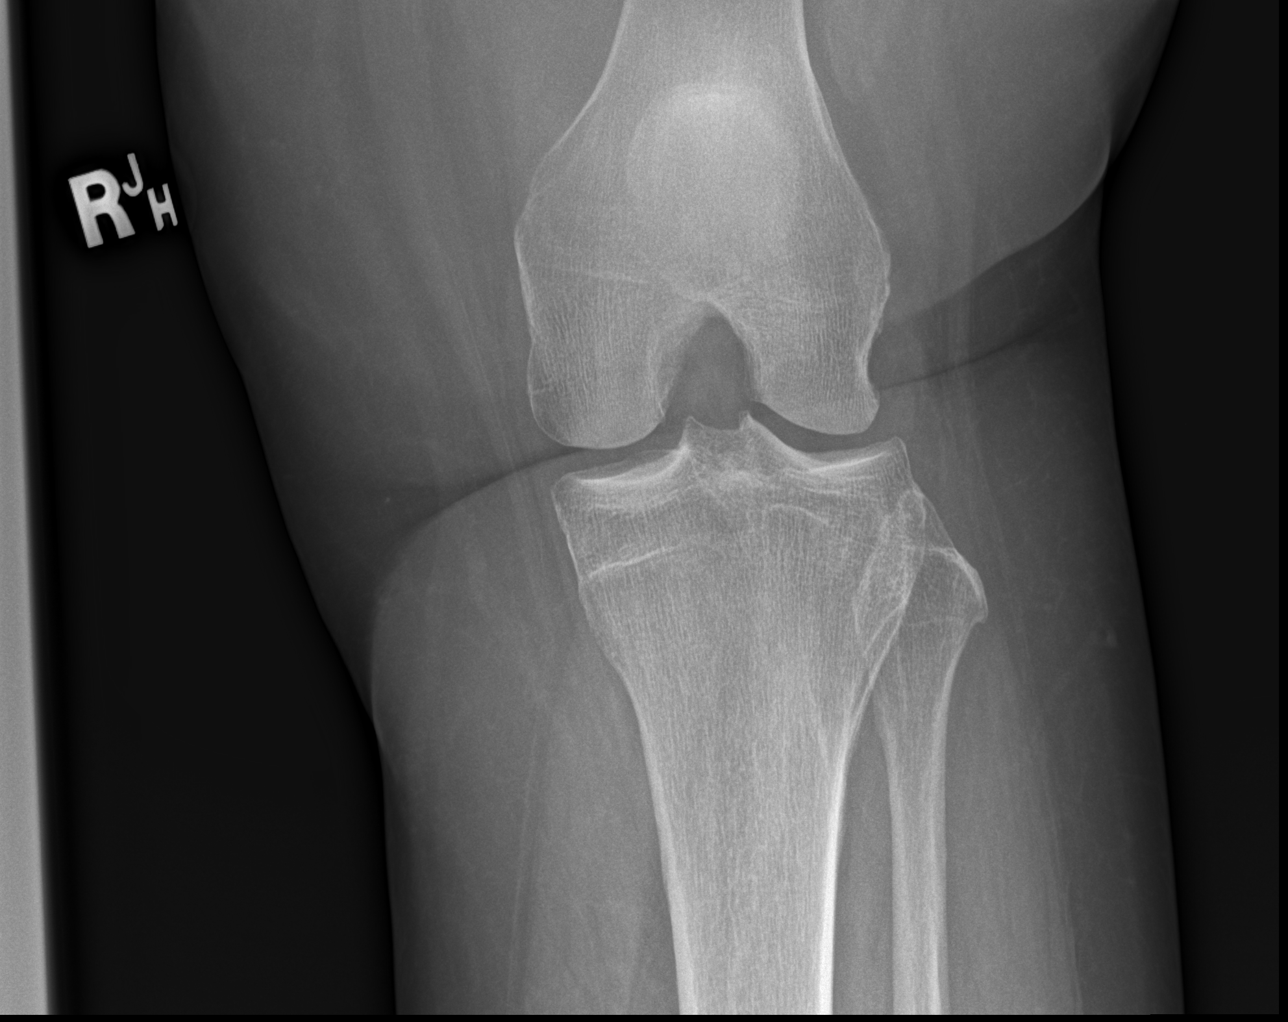

[x knee sunrise right]
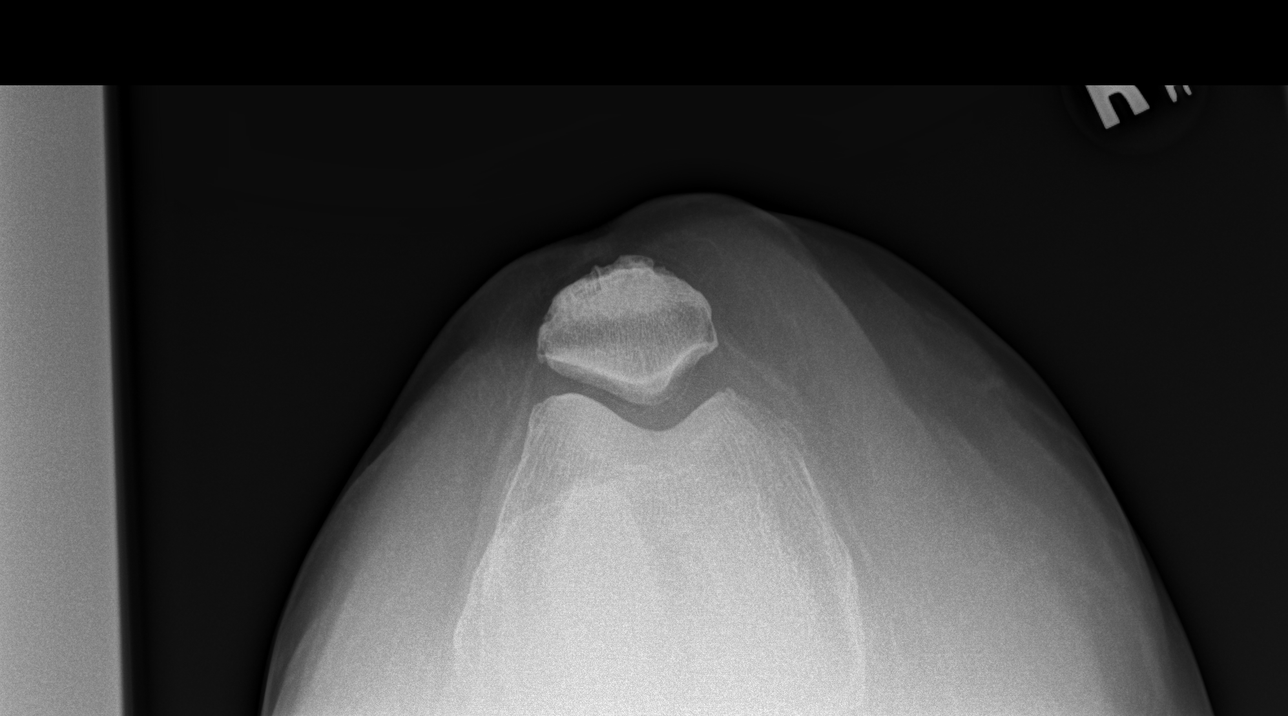

[4 of 4 positions shown; findings below may reference images not displayed]

FINDINGS: Low normal osseous mineralization.

Joint spaces fairly well preserved.

No acute fracture, dislocation, or bone destruction.

Patellar spurs at quadriceps and patellar tendon insertions.

No knee joint effusion.
IMPRESSION: No acute osseous abnormalities.

## 2020-01-02 IMAGING — CR DG KNEE COMPLETE 4+V*L*
4 series · 4 of 4 positions shown · non-contrast
Comparison: 03/31/2016

CLINICAL DATA: Chronic BILATERAL knee pain for 2 years LEFT greater
than RIGHT, soft tissue swelling on LEFT

EXAM:
LEFT KNEE - COMPLETE 4+ VIEW

[w knee ap left]
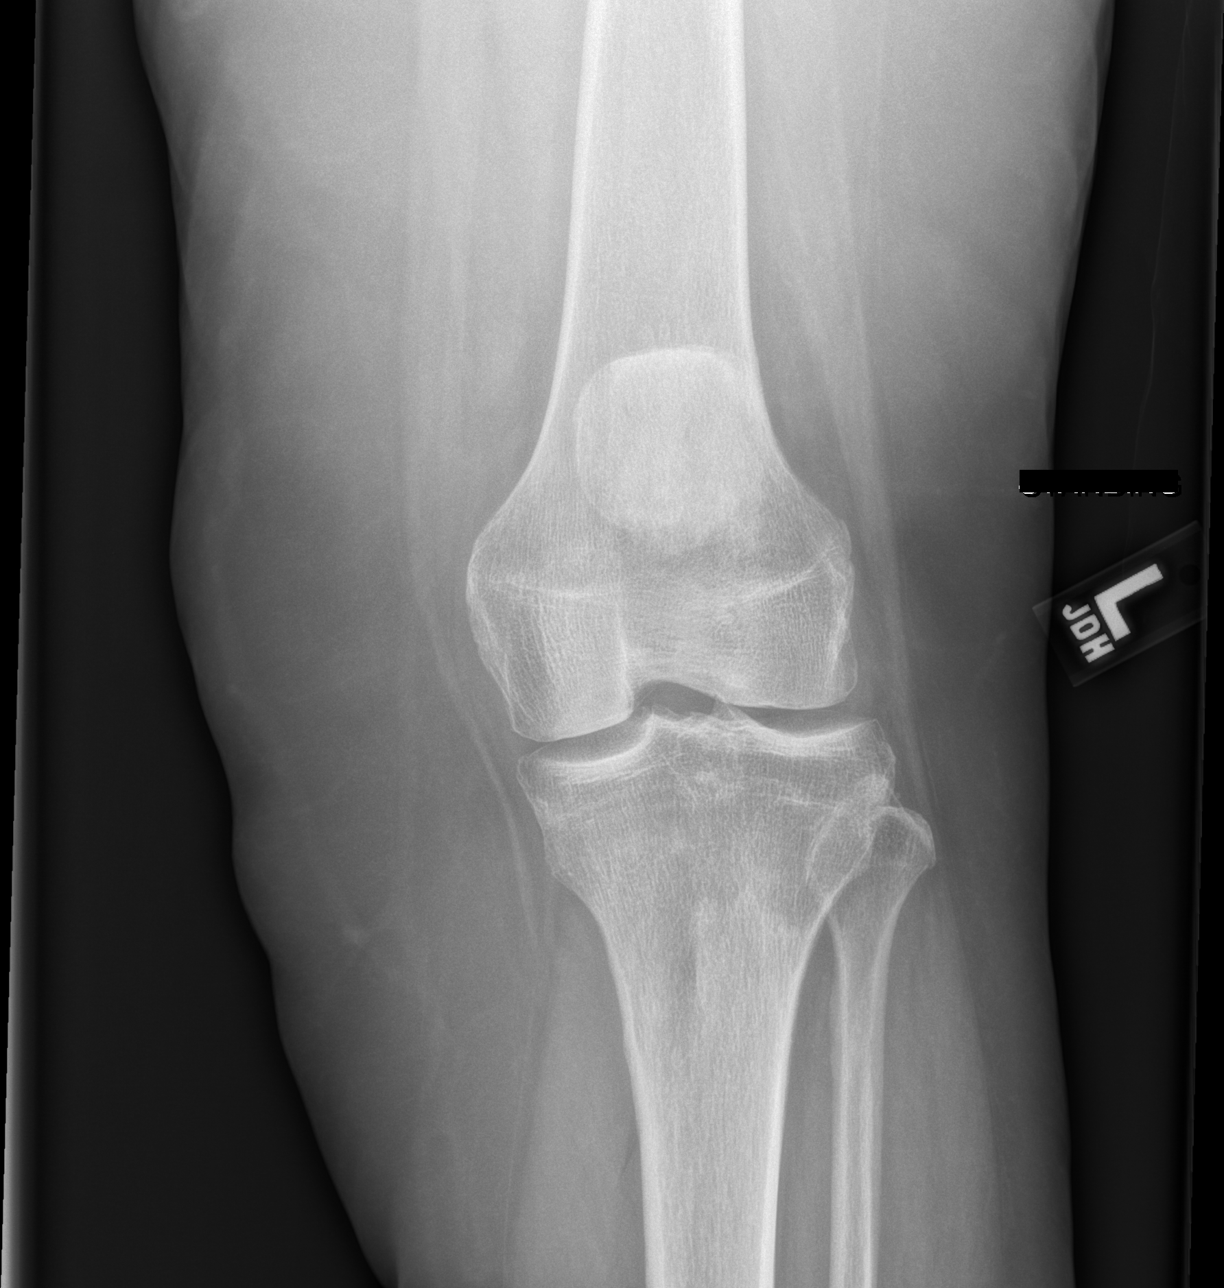

[w knee lat left]
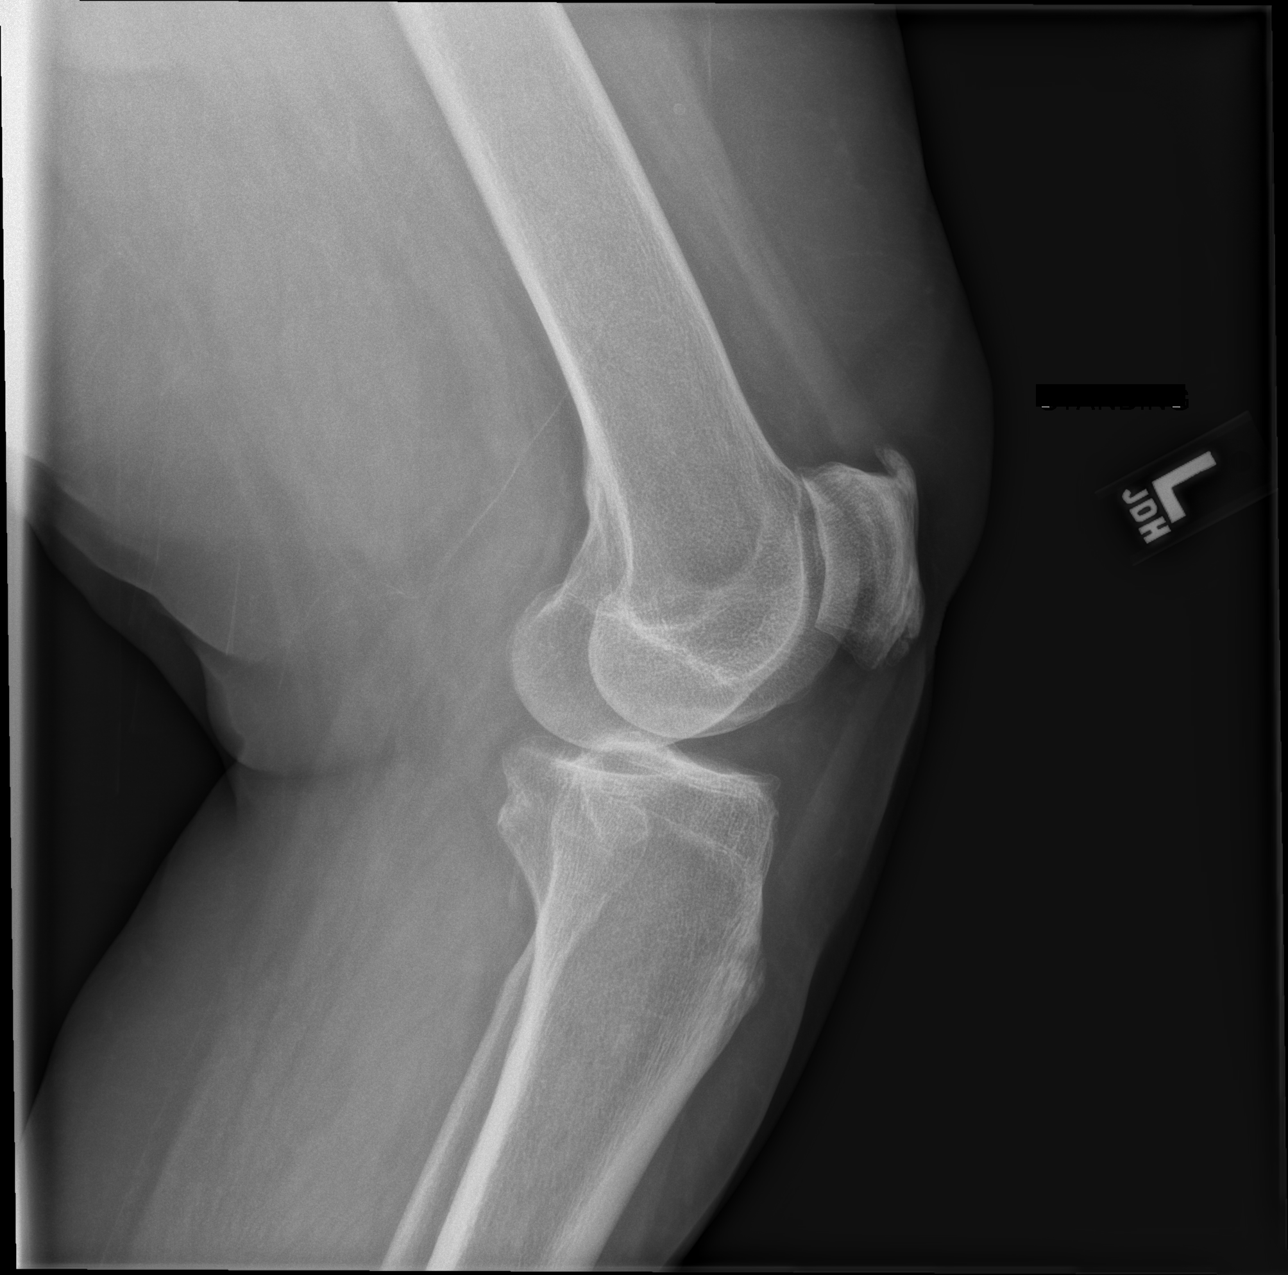

[x knee sunrise left]
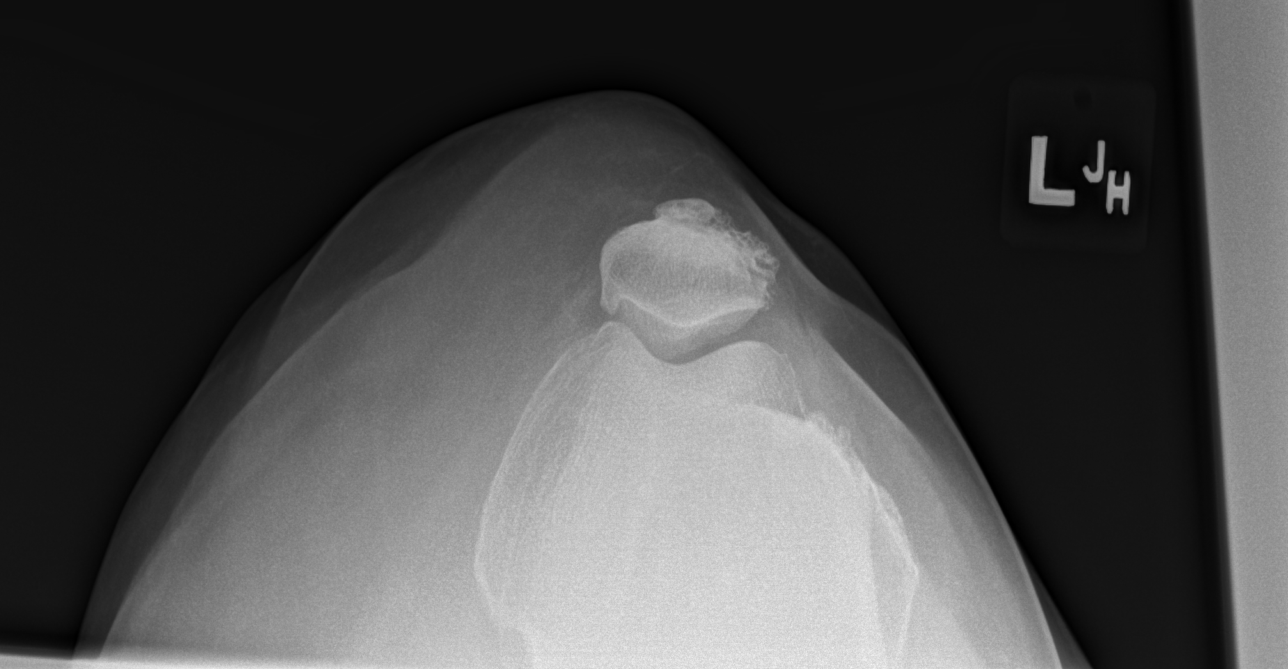

[x knee tunnel left]
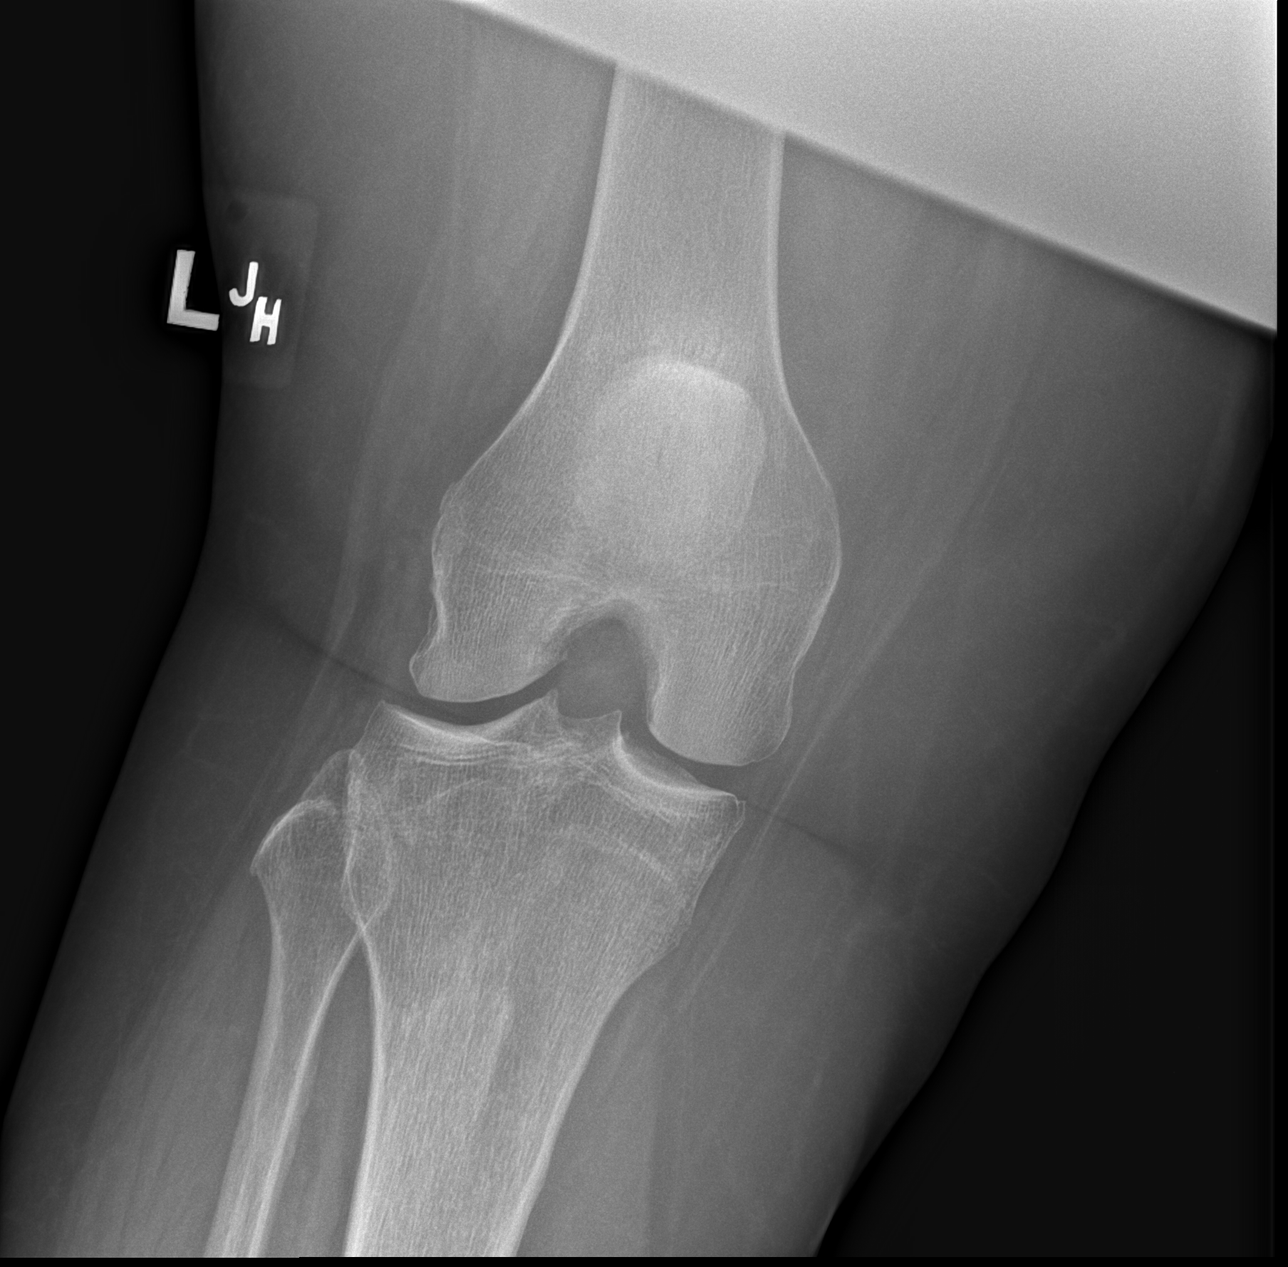

[4 of 4 positions shown; findings below may reference images not displayed]

FINDINGS: Low normal osseous mineralization.

Mild joint space narrowing at patellofemoral joint, minimally medial
compartment.

Additional patellar spur at medial patellar facet.

No knee joint effusion.

Patellar spur at quadriceps tendon insertion.

No acute fracture, dislocation, or bone destruction.
IMPRESSION: Mild degenerative changes LEFT knee primarily at patellofemoral
joint.

## 2020-01-03 DIAGNOSIS — I82412 Acute embolism and thrombosis of left femoral vein: Secondary | ICD-10-CM | POA: Diagnosis not present

## 2020-01-03 DIAGNOSIS — Z9181 History of falling: Secondary | ICD-10-CM | POA: Diagnosis not present

## 2020-01-03 DIAGNOSIS — N1832 Chronic kidney disease, stage 3b: Secondary | ICD-10-CM | POA: Diagnosis not present

## 2020-01-03 DIAGNOSIS — I129 Hypertensive chronic kidney disease with stage 1 through stage 4 chronic kidney disease, or unspecified chronic kidney disease: Secondary | ICD-10-CM | POA: Diagnosis not present

## 2020-01-03 DIAGNOSIS — R350 Frequency of micturition: Secondary | ICD-10-CM | POA: Diagnosis not present

## 2020-01-03 DIAGNOSIS — R7989 Other specified abnormal findings of blood chemistry: Secondary | ICD-10-CM | POA: Diagnosis not present

## 2020-01-04 DIAGNOSIS — D631 Anemia in chronic kidney disease: Secondary | ICD-10-CM | POA: Diagnosis not present

## 2020-01-04 DIAGNOSIS — I69311 Memory deficit following cerebral infarction: Secondary | ICD-10-CM | POA: Diagnosis not present

## 2020-01-04 DIAGNOSIS — I69354 Hemiplegia and hemiparesis following cerebral infarction affecting left non-dominant side: Secondary | ICD-10-CM | POA: Diagnosis not present

## 2020-01-04 DIAGNOSIS — M059 Rheumatoid arthritis with rheumatoid factor, unspecified: Secondary | ICD-10-CM | POA: Diagnosis not present

## 2020-01-04 DIAGNOSIS — M3500 Sicca syndrome, unspecified: Secondary | ICD-10-CM | POA: Diagnosis not present

## 2020-01-04 DIAGNOSIS — I129 Hypertensive chronic kidney disease with stage 1 through stage 4 chronic kidney disease, or unspecified chronic kidney disease: Secondary | ICD-10-CM | POA: Diagnosis not present

## 2020-01-04 DIAGNOSIS — N183 Chronic kidney disease, stage 3 unspecified: Secondary | ICD-10-CM | POA: Diagnosis not present

## 2020-01-10 DIAGNOSIS — R350 Frequency of micturition: Secondary | ICD-10-CM | POA: Diagnosis not present

## 2020-01-19 DIAGNOSIS — I10 Essential (primary) hypertension: Secondary | ICD-10-CM | POA: Diagnosis not present

## 2020-01-19 DIAGNOSIS — M47816 Spondylosis without myelopathy or radiculopathy, lumbar region: Secondary | ICD-10-CM | POA: Diagnosis not present

## 2020-01-19 DIAGNOSIS — M5136 Other intervertebral disc degeneration, lumbar region: Secondary | ICD-10-CM | POA: Diagnosis not present

## 2020-01-19 DIAGNOSIS — M48061 Spinal stenosis, lumbar region without neurogenic claudication: Secondary | ICD-10-CM | POA: Diagnosis not present

## 2020-01-19 DIAGNOSIS — M545 Low back pain: Secondary | ICD-10-CM | POA: Diagnosis not present

## 2020-01-27 DIAGNOSIS — M1711 Unilateral primary osteoarthritis, right knee: Secondary | ICD-10-CM | POA: Diagnosis not present

## 2020-02-02 DIAGNOSIS — N3281 Overactive bladder: Secondary | ICD-10-CM | POA: Diagnosis not present

## 2020-02-02 DIAGNOSIS — N1831 Chronic kidney disease, stage 3a: Secondary | ICD-10-CM | POA: Diagnosis not present

## 2020-02-02 DIAGNOSIS — G479 Sleep disorder, unspecified: Secondary | ICD-10-CM | POA: Diagnosis not present

## 2020-02-02 DIAGNOSIS — I10 Essential (primary) hypertension: Secondary | ICD-10-CM | POA: Diagnosis not present

## 2020-02-06 DIAGNOSIS — R768 Other specified abnormal immunological findings in serum: Secondary | ICD-10-CM | POA: Diagnosis not present

## 2020-02-06 DIAGNOSIS — M0579 Rheumatoid arthritis with rheumatoid factor of multiple sites without organ or systems involvement: Secondary | ICD-10-CM | POA: Diagnosis not present

## 2020-02-06 DIAGNOSIS — M150 Primary generalized (osteo)arthritis: Secondary | ICD-10-CM | POA: Diagnosis not present

## 2020-02-06 DIAGNOSIS — M545 Low back pain: Secondary | ICD-10-CM | POA: Diagnosis not present

## 2020-02-07 IMAGING — MG DIGITAL SCREENING BILATERAL MAMMOGRAM WITH TOMO AND CAD
8 series · 8 of 24 positions shown · non-contrast
Comparison: Previous exam(s).

CLINICAL DATA: Screening.

EXAM:
DIGITAL SCREENING BILATERAL MAMMOGRAM WITH TOMO AND CAD

[L MLO synth-2D]
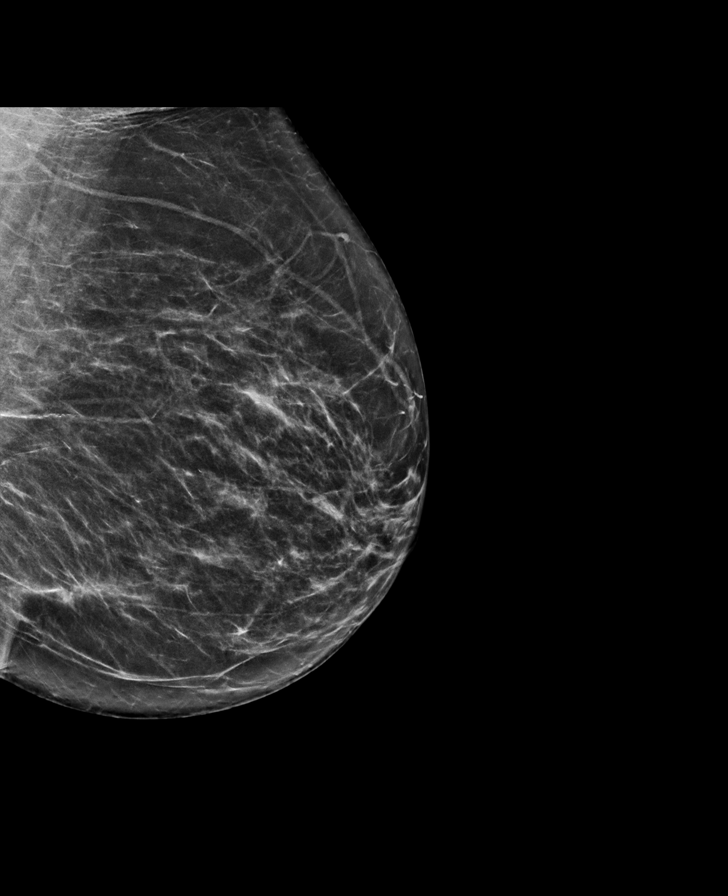

[R MLO synth-2D]
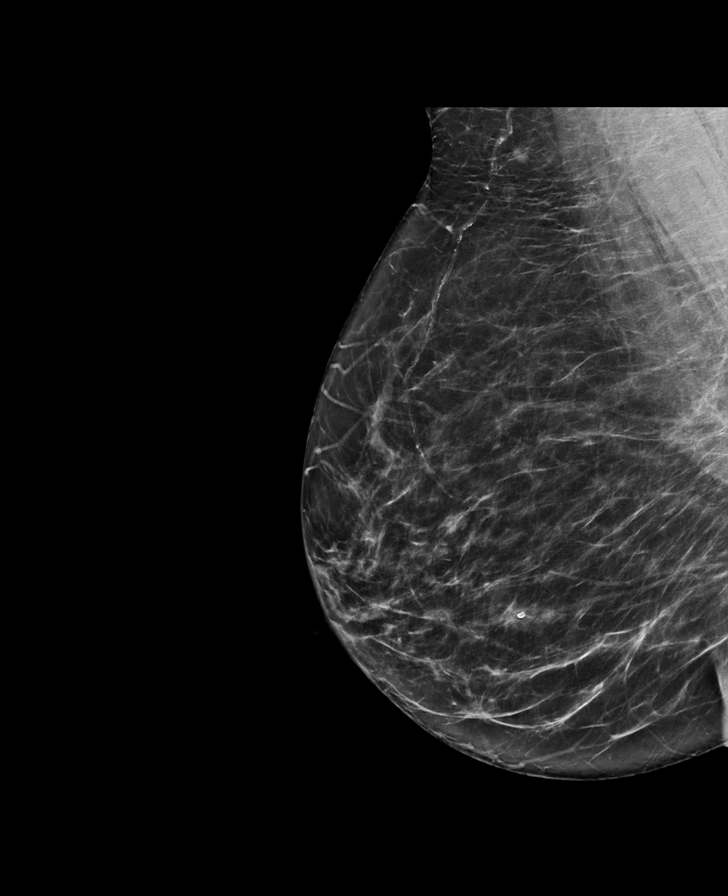

[R CC synth-2D]
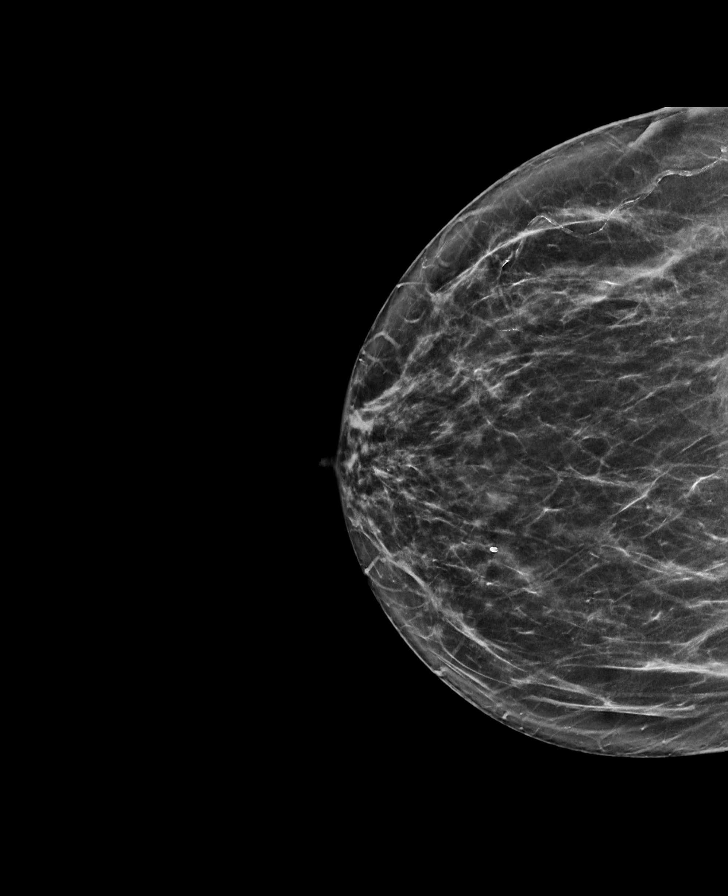

[L CC synth-2D]
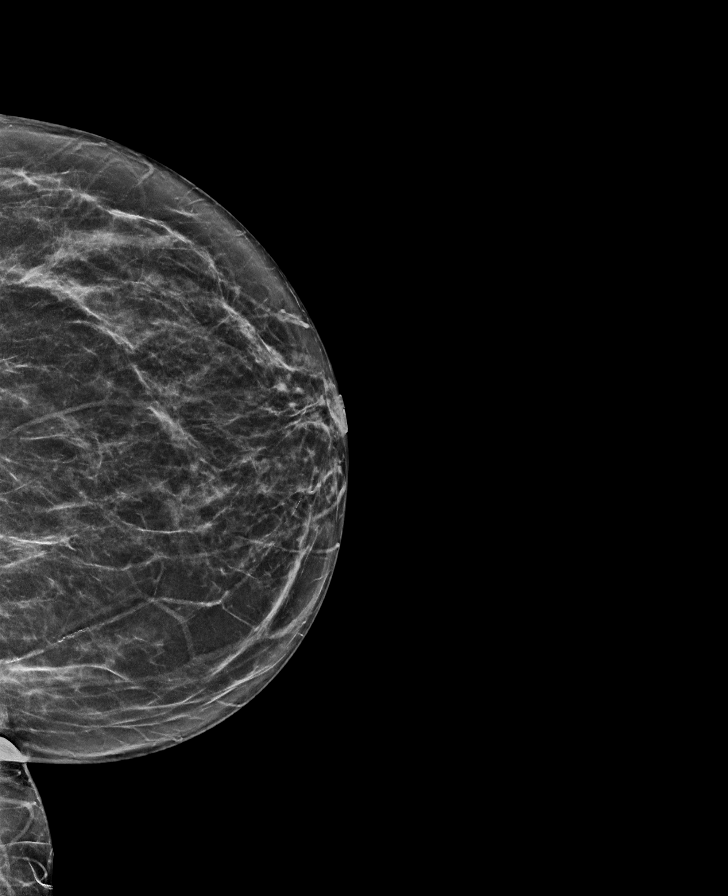

[L CC tomo · tomo slice 35/68.0]
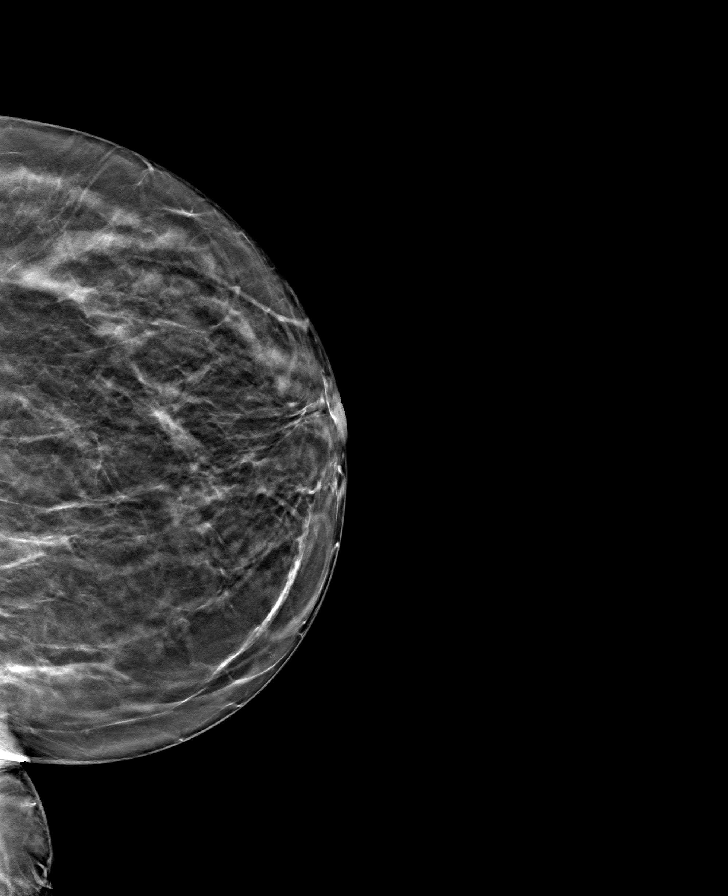

[R CC tomo · tomo slice 35/69.0]
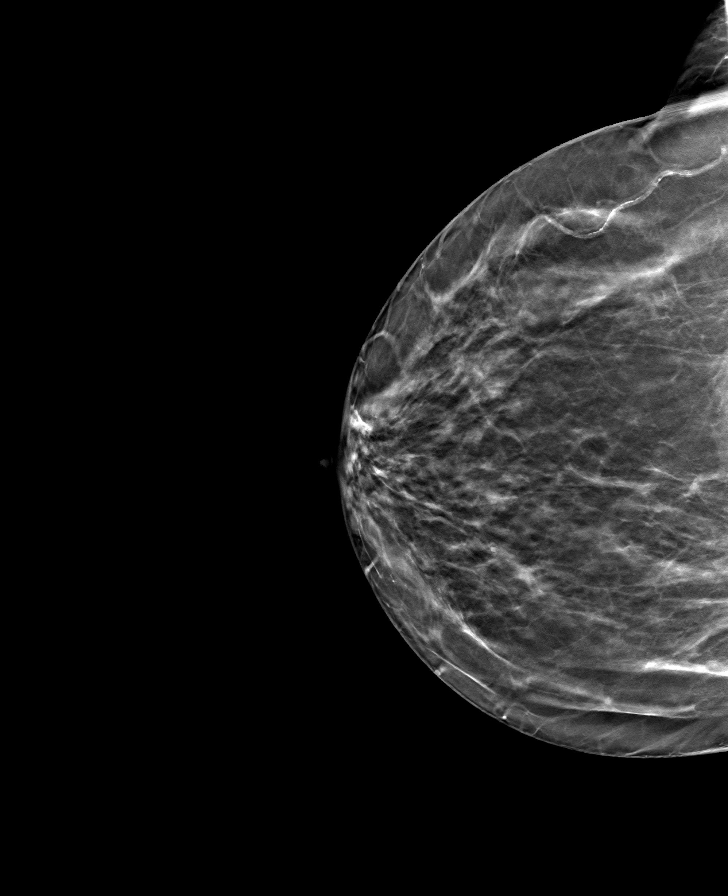

[L MLO tomo · tomo slice 37/73.0]
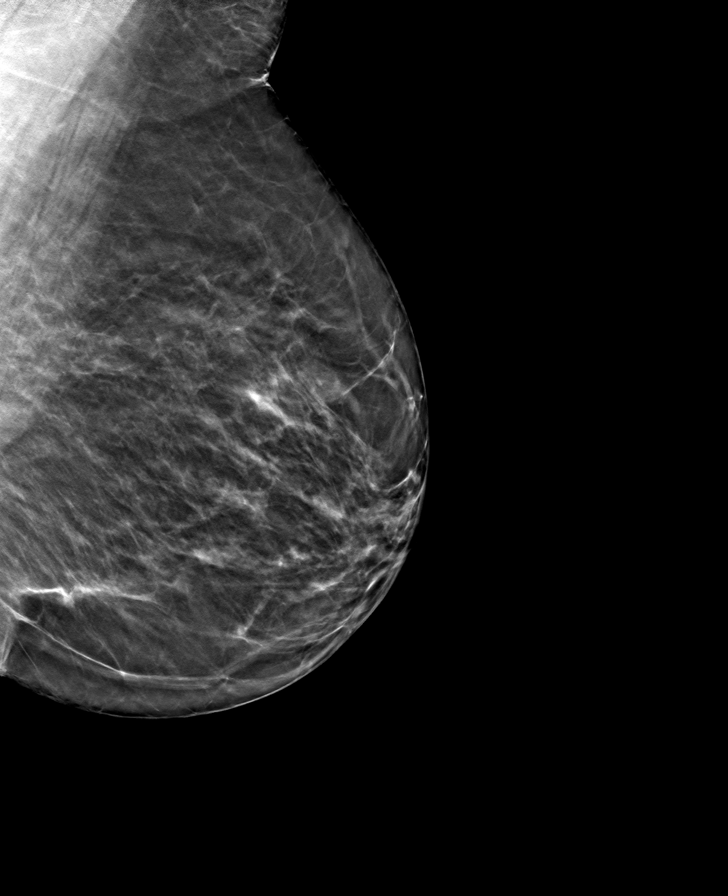

[R MLO tomo · tomo slice 39/77.0]
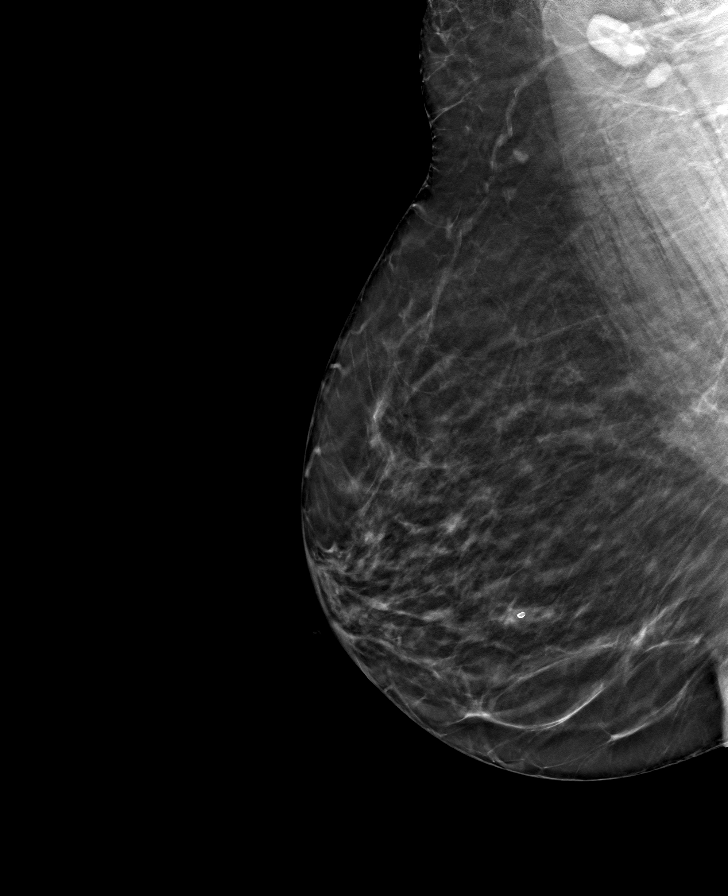

[8 of 24 positions shown; findings below may reference images not displayed]

ACR Breast Density Category b: There are scattered areas of
fibroglandular density.
FINDINGS: There are no findings suspicious for malignancy. Images were
processed with CAD.
IMPRESSION: No mammographic evidence of malignancy. A result letter of this
screening mammogram will be mailed directly to the patient.

RECOMMENDATION:
Screening mammogram in one year. (Code:[id number])

BI-RADS CATEGORY  1: Negative.

## 2020-02-14 DIAGNOSIS — I10 Essential (primary) hypertension: Secondary | ICD-10-CM | POA: Diagnosis not present

## 2020-02-14 DIAGNOSIS — R52 Pain, unspecified: Secondary | ICD-10-CM | POA: Diagnosis not present

## 2020-02-14 DIAGNOSIS — M1711 Unilateral primary osteoarthritis, right knee: Secondary | ICD-10-CM | POA: Diagnosis not present

## 2020-02-15 DIAGNOSIS — I129 Hypertensive chronic kidney disease with stage 1 through stage 4 chronic kidney disease, or unspecified chronic kidney disease: Secondary | ICD-10-CM | POA: Diagnosis not present

## 2020-02-15 DIAGNOSIS — N2581 Secondary hyperparathyroidism of renal origin: Secondary | ICD-10-CM | POA: Diagnosis not present

## 2020-02-15 DIAGNOSIS — D638 Anemia in other chronic diseases classified elsewhere: Secondary | ICD-10-CM | POA: Diagnosis not present

## 2020-02-15 DIAGNOSIS — A0839 Other viral enteritis: Secondary | ICD-10-CM | POA: Diagnosis not present

## 2020-02-15 DIAGNOSIS — N1831 Chronic kidney disease, stage 3a: Secondary | ICD-10-CM | POA: Diagnosis not present

## 2020-02-23 DIAGNOSIS — M8589 Other specified disorders of bone density and structure, multiple sites: Secondary | ICD-10-CM | POA: Diagnosis not present

## 2020-02-23 DIAGNOSIS — M85851 Other specified disorders of bone density and structure, right thigh: Secondary | ICD-10-CM | POA: Diagnosis not present

## 2020-02-29 DIAGNOSIS — M8589 Other specified disorders of bone density and structure, multiple sites: Secondary | ICD-10-CM | POA: Diagnosis not present

## 2020-02-29 DIAGNOSIS — I10 Essential (primary) hypertension: Secondary | ICD-10-CM | POA: Diagnosis not present

## 2020-02-29 DIAGNOSIS — M069 Rheumatoid arthritis, unspecified: Secondary | ICD-10-CM | POA: Diagnosis not present

## 2020-02-29 DIAGNOSIS — M79601 Pain in right arm: Secondary | ICD-10-CM | POA: Diagnosis not present

## 2020-02-29 DIAGNOSIS — R079 Chest pain, unspecified: Secondary | ICD-10-CM | POA: Diagnosis not present

## 2020-03-14 DIAGNOSIS — M069 Rheumatoid arthritis, unspecified: Secondary | ICD-10-CM | POA: Diagnosis not present

## 2020-03-14 DIAGNOSIS — D649 Anemia, unspecified: Secondary | ICD-10-CM | POA: Diagnosis not present

## 2020-03-14 DIAGNOSIS — R5383 Other fatigue: Secondary | ICD-10-CM | POA: Diagnosis not present

## 2020-03-14 DIAGNOSIS — I10 Essential (primary) hypertension: Secondary | ICD-10-CM | POA: Diagnosis not present

## 2020-03-14 DIAGNOSIS — N1832 Chronic kidney disease, stage 3b: Secondary | ICD-10-CM | POA: Diagnosis not present

## 2020-03-14 DIAGNOSIS — R7309 Other abnormal glucose: Secondary | ICD-10-CM | POA: Diagnosis not present

## 2020-03-14 DIAGNOSIS — M79601 Pain in right arm: Secondary | ICD-10-CM | POA: Diagnosis not present

## 2020-03-14 DIAGNOSIS — M1711 Unilateral primary osteoarthritis, right knee: Secondary | ICD-10-CM | POA: Diagnosis not present

## 2020-03-16 ENCOUNTER — Emergency Department (HOSPITAL_COMMUNITY): Payer: Medicare Other

## 2020-03-16 ENCOUNTER — Emergency Department (HOSPITAL_COMMUNITY)
Admission: EM | Admit: 2020-03-16 | Discharge: 2020-03-16 | Disposition: A | Discharge status: Home / Self Care | Payer: Medicare Other | Attending: Emergency Medicine | Admitting: Emergency Medicine

## 2020-03-16 ENCOUNTER — Encounter (HOSPITAL_COMMUNITY): Payer: Self-pay | Admitting: Emergency Medicine

## 2020-03-16 DIAGNOSIS — Z79899 Other long term (current) drug therapy: Secondary | ICD-10-CM | POA: Diagnosis not present

## 2020-03-16 DIAGNOSIS — K5730 Diverticulosis of large intestine without perforation or abscess without bleeding: Secondary | ICD-10-CM | POA: Diagnosis not present

## 2020-03-16 DIAGNOSIS — R197 Diarrhea, unspecified: Secondary | ICD-10-CM | POA: Insufficient documentation

## 2020-03-16 DIAGNOSIS — I129 Hypertensive chronic kidney disease with stage 1 through stage 4 chronic kidney disease, or unspecified chronic kidney disease: Secondary | ICD-10-CM | POA: Diagnosis not present

## 2020-03-16 DIAGNOSIS — Z7982 Long term (current) use of aspirin: Secondary | ICD-10-CM | POA: Insufficient documentation

## 2020-03-16 DIAGNOSIS — R1110 Vomiting, unspecified: Secondary | ICD-10-CM | POA: Diagnosis not present

## 2020-03-16 DIAGNOSIS — R109 Unspecified abdominal pain: Secondary | ICD-10-CM | POA: Diagnosis not present

## 2020-03-16 DIAGNOSIS — N183 Chronic kidney disease, stage 3 unspecified: Secondary | ICD-10-CM | POA: Diagnosis not present

## 2020-03-16 DIAGNOSIS — R112 Nausea with vomiting, unspecified: Secondary | ICD-10-CM | POA: Diagnosis not present

## 2020-03-16 DIAGNOSIS — R079 Chest pain, unspecified: Secondary | ICD-10-CM | POA: Diagnosis not present

## 2020-03-16 LAB — CBC
HCT: 37.8 % (ref 36.0–46.0)
Hemoglobin: 12 g/dL (ref 12.0–15.0)
MCH: 30.6 pg (ref 26.0–34.0)
MCHC: 31.7 g/dL (ref 30.0–36.0)
MCV: 96.4 fL (ref 80.0–100.0)
Platelets: 210 10*3/uL (ref 150–400)
RBC: 3.92 MIL/uL (ref 3.87–5.11)
RDW: 14.6 % (ref 11.5–15.5)
WBC: 10.5 10*3/uL (ref 4.0–10.5)
nRBC: 0 % (ref 0.0–0.2)

## 2020-03-16 LAB — COMPREHENSIVE METABOLIC PANEL
ALT: 18 U/L (ref 0–44)
AST: 21 U/L (ref 15–41)
Albumin: 3.9 g/dL (ref 3.5–5.0)
Alkaline Phosphatase: 105 U/L (ref 38–126)
Anion gap: 12 (ref 5–15)
BUN: 33 mg/dL — ABNORMAL HIGH (ref 8–23)
CO2: 23 mmol/L (ref 22–32)
Calcium: 9.5 mg/dL (ref 8.9–10.3)
Chloride: 104 mmol/L (ref 98–111)
Creatinine, Ser: 1.82 mg/dL — ABNORMAL HIGH (ref 0.44–1.00)
GFR calc Af Amer: 33 mL/min — ABNORMAL LOW (ref >60)
GFR calc non Af Amer: 28 mL/min — ABNORMAL LOW (ref >60)
Glucose, Bld: 124 mg/dL — ABNORMAL HIGH (ref 70–99)
Potassium: 5.2 mmol/L — ABNORMAL HIGH (ref 3.5–5.1)
Sodium: 139 mmol/L (ref 135–145)
Total Bilirubin: 1 mg/dL (ref 0.3–1.2)
Total Protein: 7.3 g/dL (ref 6.5–8.1)

## 2020-03-16 LAB — URINALYSIS, ROUTINE W REFLEX MICROSCOPIC
Bilirubin Urine: NEGATIVE
Glucose, UA: NEGATIVE mg/dL
Hgb urine dipstick: NEGATIVE
Ketones, ur: NEGATIVE mg/dL
Leukocytes,Ua: NEGATIVE
Nitrite: NEGATIVE
Protein, ur: NEGATIVE mg/dL
Specific Gravity, Urine: 1.016 (ref 1.005–1.030)
pH: 6 (ref 5.0–8.0)

## 2020-03-16 LAB — LIPASE, BLOOD: Lipase: 22 U/L (ref 11–51)

## 2020-03-16 LAB — POC OCCULT BLOOD, ED: Fecal Occult Bld: NEGATIVE

## 2020-03-16 MED ORDER — SODIUM CHLORIDE 0.9 % IV BOLUS: 1000.0000 mL | Freq: Once | INTRAVENOUS | Status: DC

## 2020-03-16 MED ORDER — ACETAMINOPHEN 325 MG PO TABS
650.0000 mg | ORAL_TABLET | Freq: Once | ORAL | Status: AC
  Administered 2020-03-16: 650 mg via ORAL
  Filled 2020-03-16: qty 2

## 2020-03-16 MED ORDER — SODIUM CHLORIDE 0.9% FLUSH: 3.0000 mL | Freq: Once | INTRAVENOUS | Status: DC

## 2020-03-16 NOTE — ED Provider Notes (Signed)
[facility] EMERGENCY DEPARTMENT Provider Note   CSN: [id number] Arrival date & time: 03/16/20  1446     History Chief Complaint  Patient presents with  . Abdominal Pain    Michelle Graves is a 69 y.o. female.  HPI 69 year old female with a history of malignant hypertension, CKD stage III, rheumatoid arthritis, new onset A. fib on aspirin, acute ischemic stroke, CMV colitis presents to the ER with 2-day history of lower abdominal pain, right flank pain, nausea and vomiting.  Patient states that her abdominal pain began approximately 2 days ago, which followed with subsequent headache and right flank pain.  She has also had some dark-colored diarrhea with no blood over the last several days.  She has not started any new antibiotics.  She also endorses a mild headache and nausea, nonbloody nonbilious vomiting which began earlier this morning.  No known sick contacts.  She denies any fevers, chills, chest pain, shortness of breath, dysuria, hematuria, hematochezia, vaginal bleeding, pain, discharge.    Past Medical History:  Diagnosis Date  . Acute on chronic kidney failure ([place])   . Anemia   . Arthritis   . Chest pain   . Diverticulitis   . Hypertension   . Stroke ([place])   . UTI (urinary tract infection) 10/2019    Patient Active Problem List   Diagnosis Date Noted  . UTI (urinary tract infection) 11/08/2019  . Major depressive disorder, recurrent episode, moderate ([place]) 11/08/2019  . Urinary retention 10/28/2019  . Acute on chronic kidney failure ([place]) 10/27/2019  . Malnutrition of moderate degree 10/03/2019  . Colitis 09/30/2019  . AKI (acute kidney injury) ([place]) 05/28/2019  . Nausea & vomiting 05/28/2019  . 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 Anemia, chronic disease 05/28/2019  . Swelling   . Small vessel disease, cerebrovascular   . Acute ischemic stroke ([place])   . Weakness   . Acute left ankle pain   . Acute on chronic renal failure ([place]) 05/13/2019   . Weakness of both lower extremities 05/13/2019  . Cerebral thrombosis with cerebral infarction 03/31/2019  . New onset a-fib ([place]) 03/27/2019  . Acute kidney injury superimposed on CKD ([place]) 03/24/2019  . Hypotension 03/24/2019  . Dark emesis 03/24/2019  . Dark stools 03/24/2019  . Diverticulitis of intestine without perforation or abscess without bleeding   . Sinus tachycardia 08/27/2016  . Diverticulitis 08/26/2016  . HYPERTENSION, MALIGNANT ESSENTIAL 06/24/2007  . KIDNEY DISEASE, CHRONIC, STAGE III 06/24/2007  . ARTHRITIS, RHEUMATOID, SEROPOSITIVE 06/24/2007    Past Surgical History:  Procedure Laterality Date  . BIOPSY  04/01/2019   Procedure: BIOPSY;  Surgeon: Otis Brace, MD;  Location: [facility];  Service: Gastroenterology;;  . BIOPSY  10/05/2019   Procedure: BIOPSY;  Surgeon: Otis Brace, MD;  Location: [facility];  Service: Gastroenterology;;  . COLONOSCOPY WITH PROPOFOL N/A 04/01/2019   Procedure: COLONOSCOPY WITH PROPOFOL;  Surgeon: Otis Brace, MD;  Location: [facility];  Service: Gastroenterology;  Laterality: N/A;  . COLONOSCOPY WITH PROPOFOL N/A 10/05/2019   Procedure: COLONOSCOPY WITH PROPOFOL;  Surgeon: Otis Brace, MD;  Location: [facility];  Service: Gastroenterology;  Laterality: N/A;  . ESOPHAGOGASTRODUODENOSCOPY (EGD) WITH PROPOFOL N/A 04/01/2019   Procedure: ESOPHAGOGASTRODUODENOSCOPY (EGD) WITH PROPOFOL;  Surgeon: Otis Brace, MD;  Location: [facility];  Service: Gastroenterology;  Laterality: N/A;  . ESOPHAGOGASTRODUODENOSCOPY (EGD) WITH PROPOFOL N/A 10/05/2019   Procedure: ESOPHAGOGASTRODUODENOSCOPY (EGD) WITH PROPOFOL;  Surgeon: Otis Brace, MD;  Location: [facility];  Service: Gastroenterology;  Laterality: N/A;  .  TONSILLECTOMY       OB History   No obstetric history on file.     Family History  Problem Relation Age of Onset  . Hypertension Mother   . Diabetes Mother   . CAD Father        died of MI at  age 70  . Hypertension Father   . Diabetes Sister   . Diabetes Sister   . Kidney disease Neg Hx     Social History   Tobacco Use  . Smoking status: Never Smoker  . Smokeless tobacco: Never Used  Substance Use Topics  . Alcohol use: Yes    Alcohol/week: 21.0 standard drinks    Types: 21 Glasses of wine per week    Comment: sometimes   . Drug use: No    Home Medications Prior to Admission medications   Medication Sig Start Date End Date Taking? Authorizing Provider  amLODipine (NORVASC) 10 MG tablet Take 10 mg by mouth daily. 08/19/19   [provider]  aspirin EC 81 MG EC tablet Take 1 tablet (81 mg total) by mouth daily. 05/16/19   Verdene Lennert, MD  chlorproMAZINE (THORAZINE) 25 MG tablet Take 1 tablet (25 mg total) by mouth 3 (three) times daily as needed for hiccoughs. 11/12/19   Elpidio Anis, PA-C  clopidogrel (PLAVIX) 75 MG tablet Take 1 tablet (75 mg total) by mouth daily. 04/05/19   Glade Lloyd, MD  colestipol (COLESTID) 1 g tablet Take 1 tablet (1 g total) by mouth 2 (two) times daily. 05/16/19   Verdene Lennert, MD  docusate sodium (COLACE) 100 MG capsule Take 1 capsule (100 mg total) by mouth 2 (two) times daily. 06/02/19   Shahmehdi, Gemma Payor, MD  DULoxetine (CYMBALTA) 60 MG capsule Take 60 mg by mouth at bedtime.     [provider]  feeding supplement, ENSURE ENLIVE, (ENSURE ENLIVE) LIQD Take 237 mLs by mouth 3 (three) times daily between meals. 06/02/19   Shahmehdi, Gemma Payor, MD  HYDROcodone-acetaminophen (NORCO/VICODIN) 5-325 MG tablet Take 1 tablet by mouth 3 (three) times daily as needed. 10/14/19   [provider]  hydroxychloroquine (PLAQUENIL) 200 MG tablet Take 200 mg by mouth 2 (two) times daily.  06/16/16   [provider]  megestrol (MEGACE) 400 MG/10ML suspension Take 10 mLs (400 mg total) by mouth 2 (two) times daily. 06/02/19   Shahmehdi, Gemma Payor, MD  metoprolol tartrate (LOPRESSOR) 100 MG tablet Take 100 mg by mouth daily.   11/19/15   [provider]  Multiple Vitamin (MULTIVITAMIN WITH MINERALS) TABS tablet Take 1 tablet by mouth daily. 06/03/19   Shahmehdi, Gemma Payor, MD  MYRBETRIQ 25 MG TB24 tablet Take 25 mg by mouth daily. 10/18/19   [provider]  omeprazole (PRILOSEC) 20 MG capsule Take 20 mg by mouth daily as needed (acid reflux).  08/22/19   [provider]  ondansetron (ZOFRAN) 4 MG tablet Take 1 tablet (4 mg total) by mouth every 6 (six) hours as needed for nausea. Patient taking differently: Take 4 mg by mouth every 6 (six) hours as needed for nausea or vomiting.  04/04/19   Glade Lloyd, MD  tiZANidine (ZANAFLEX) 4 MG tablet Take 4 mg by mouth 2 (two) times daily. 08/22/19   [provider]    Allergies    Patient has no known allergies.  Review of Systems   Review of Systems   Constitutional: Negative for appetite change, chills and fever.  HENT: Negative for ear pain and  sore throat.   Eyes: Negative for pain and visual disturbance.  Respiratory: Negative for cough and shortness of breath.   Cardiovascular: Negative for chest pain and palpitations.  Gastrointestinal: Positive for abdominal pain, diarrhea, nausea and vomiting.  Genitourinary: Negative for dysuria and hematuria.  Musculoskeletal: Positive for back pain. Negative for arthralgias and myalgias.  Skin: Negative for color change and rash.  Neurological: Positive for headaches. Negative for seizures and syncope.  Psychiatric/Behavioral: Negative for confusion.  All other systems reviewed and are negative.   Physical Exam Updated Vital Signs BP 119/85 (BP Location: Right Arm)   Pulse 81   Temp 98.7 F (37.1 C) (Oral)   Resp 15   Ht 5' 6.5" (1.689 m)   Wt 83 kg   SpO2 99%   BMI 29.09 kg/m   Physical Exam Vitals and nursing note reviewed.  Constitutional:      General: She is not in acute distress.    Appearance: She is well-developed.  HENT:     Head: Normocephalic and atraumatic.   Eyes:     Conjunctiva/sclera: Conjunctivae normal.  Cardiovascular:     Rate and Rhythm: Normal rate and regular rhythm.     Heart sounds: Normal heart sounds. No murmur.  Pulmonary:     Effort: Pulmonary effort is normal. No respiratory distress.     Breath sounds: Normal breath sounds.  Abdominal:     Palpations: Abdomen is soft.     Tenderness: There is abdominal tenderness (bilateral lower abdominal tenderness). There is right CVA tenderness. Negative signs include Murphy's sign and McBurney's sign.  Musculoskeletal:     Cervical back: Neck supple.     Comments: Right-sided CVA/L-spine paraspinal tenderness.  No C, T, L-spine midline tenderness.  5/5 strength in upper and lower extremities, sensations intact, full range of motion of lower extremities.  No evidence of step-offs, crepitus over L-spine.  Skin:    General: Skin is warm and dry.     Findings: No erythema or rash.  Neurological:     Mental Status: She is alert.     ED Results / Procedures / Treatments   Labs (all labs ordered are listed, but only abnormal results are displayed) Labs Reviewed  COMPREHENSIVE METABOLIC PANEL - Abnormal; Notable for the following components:      Result Value   Potassium 5.2 (*)    Glucose, Bld 124 (*)    BUN 33 (*)    Creatinine, Ser 1.82 (*)    GFR calc non Af Amer 28 (*)    GFR calc Af Amer 33 (*)    All other components within normal limits  LIPASE, BLOOD  CBC  URINALYSIS, ROUTINE W REFLEX MICROSCOPIC  POC OCCULT BLOOD, ED  POCT GASTRIC OCCULT BLOOD (1-CARD TO LAB)    EKG EKG Interpretation  Date/Time:  Friday Mar 16 2020 20:27:46 EDT Ventricular Rate:  74 PR Interval:    QRS Duration: 96 QT Interval:  399 QTC Calculation: 443 R Axis:   -30 Text Interpretation: Sinus rhythm Left axis deviation Low voltage, precordial leads Abnormal R-wave progression, early transition Confirmed by Virgina Norfolk [phone]) on 03/16/2020 8:29:20 PM   Radiology CT ABDOMEN PELVIS WO  CONTRAST  Result Date: 03/16/2020 CLINICAL DATA:  Lower abdominal pain with nausea, vomiting and diarrhea. EXAM: CT ABDOMEN AND PELVIS WITHOUT CONTRAST TECHNIQUE: Multidetector CT imaging of the abdomen and pelvis was performed following the standard protocol without IV contrast. COMPARISON:  October 26, 2019 FINDINGS: Lower chest: Mild atelectasis is seen  within the bilateral lung bases. Hepatobiliary: No focal liver abnormality is seen. No gallstones, gallbladder wall thickening, or biliary dilatation. Pancreas: Unremarkable. No pancreatic ductal dilatation or surrounding inflammatory changes. Spleen: Normal in size without focal abnormality. Adrenals/Urinary Tract: Adrenal glands are unremarkable. Kidneys are normal, without renal calculi, focal lesion, or hydronephrosis. Bladder is unremarkable. Stomach/Bowel: Stomach is within normal limits. The appendix is not clearly identified. No evidence of bowel wall thickening, distention, or inflammatory changes. Noninflamed diverticula are seen within the descending and sigmoid colon. Vascular/Lymphatic: No significant vascular findings are present. No enlarged abdominal or pelvic lymph nodes. Reproductive: Uterus and bilateral adnexa are unremarkable. Other: No abdominal wall hernia or abnormality. No abdominopelvic ascites. Musculoskeletal: A deformity of indeterminate age is seen along the superior endplate of the L5 vertebral body. This represents a new finding compared to the prior study. Multilevel degenerative changes seen within the lumbar spine with approximately 2 mm anterolisthesis of the L4 vertebral body on L5. IMPRESSION: 1. Colonic diverticulosis without evidence of acute diverticulitis. 2. L5 vertebral body deformity of indeterminate age, new compared to the prior study. Correlation with MRI is recommended. 3. Multilevel degenerative changes within the lumbar spine with approximately 2 mm anterolisthesis of the L4 vertebral body on L5. Electronically  Signed   By: Aram Candela M.D.   On: 03/16/2020 20:46    Procedures Procedures (including critical care time)  Medications Ordered in ED Medications  sodium chloride flush (NS) 0.9 % injection 3 mL (has no administration in time range)  sodium chloride 0.9 % bolus 1,000 mL (has no administration in time range)  acetaminophen (TYLENOL) tablet 650 mg (650 mg Oral Given 03/16/20 2151)    ED Course  I have reviewed the triage vital signs and the nursing notes.  Pertinent labs & imaging results that were available during my care of the patient were reviewed by me and considered in my medical decision making (see chart for details).    MDM Rules/Calculators/A&P                     69 year old female with nausea, vomiting, diarrhea x2 days and right flank pain. On presentation to the ER, patient is alert and oriented, nontoxic-appearing, speaking in full sentences, without increased work of breathing.  Vitals overall reassuring.  Physical exam positive for mild nonfocal lower abdominal tenderness and right paraspinal/flank tenderness.  Patient moving all 4 extremities without difficulty.  EKG normal sinus rhythm.  CBC without leukocytosis, normal hemoglobin.  CMP with mildly elevated potassium 5.2, likely noncontributory.  Glucose 124, creatinine 1.82, BUN 33, which appeared to be baseline for her.  BUN slightly elevated, likely in the setting of dehydration.  UA without evidence of UTI.  Lipase normal at 22.  Point of care occult blood negative.  CT abdomen without evidence of diverticulitis, SBO, appendicitis, perforation, or any other intra-abdominal pathology.  CT did note deformity on L5 of indeterminate age.  Patient states that she had a small fall approximately a month ago, but endorses chronic back pain.  She states that she has not been having any difficulty ambulating, though notes some back pain at night which causes trouble sleeping.  She is not having any numbness in her lower  extremities, weakness, foot drop, groin numbness, incontinence of bowel and bladder.  She has no midline tenderness, most of her tenderness to palpation is right paraspinal L-spine.  She could benefit from a follow-up MRI but this may be done in an outpatient setting.  I informed the patient of the CT findings, she is agreeable to following up with her PCP.  Patient was a difficult stick, was not able to provide fluid bolus, patient was given 650 mg Tylenol in the ER and notes some mild improvement in her pain.  Suspect viral gastroenteritis etiology of her symptoms.  Hesitate to give her Zofran and Bentyl, muscle relaxers given her age.  Encouraged her to continue to take over-the-counter Tylenol for her pain.  Patient tolerated p.o. challenge without difficulty.  Overall work-up reassuring.  I encouraged her to start gentle rehydration, start with bland foods, handout provided.  Return precautions given.  Stressed follow-up with PCP for further management of her back pain.  Provided neurosurgery referral just in case.  patient voices understanding is agreeable to this plan.  At this stage in the ED course, the patient has been adequately screened and is stable for discharge.  I discussed the case with Dr. Lockie Mola who is agreeable to the above plan. Final Clinical Impression(s) / ED Diagnoses Final diagnoses:  Right flank pain  Nausea vomiting and diarrhea    Rx / DC Orders ED Discharge Orders    None       Mare Ferrari, PA-C 03/16/20 2257    Virgina Norfolk, DO 03/17/20 0010

## 2020-03-16 NOTE — ED Notes (Signed)
Pt transported to CT via stretcher.  

## 2020-03-16 NOTE — ED Triage Notes (Signed)
Pt arrives to ED with c/o of lower abd pain with n/v/d for 2 days. Pt reports about 8 episodes of both in last 24 hours.

## 2020-03-16 NOTE — ED Notes (Signed)
Pt describing right lower back/right flank pain, 10/10, dull but constant. Pt denies pain elsewhere

## 2020-03-16 NOTE — ED Notes (Signed)
ED Provider at bedside. 

## 2020-03-16 NOTE — Discharge Instructions (Addendum)
Your work-up today was overall reassuring.  Your CT scan did not show any abdominal problems.  Your urine did not show any evidence of UTI.  Please make sure to follow-up with your primary care provider over your back pain.  Your nausea vomiting and diarrhea is likely due to a stomach virus.  Continue to take over-the-counter Tylenol, start gentle rehydration with water, Gatorade.  Start with a bland diet, I have provided a handout with examples of things that you can eat.  Gradually work up [employer].  Return to the ER if your symptoms worsen.  I have also provided a neurosurgery referral if needed.

## 2020-03-29 DIAGNOSIS — Z79899 Other long term (current) drug therapy: Secondary | ICD-10-CM | POA: Diagnosis not present

## 2020-03-29 DIAGNOSIS — M129 Arthropathy, unspecified: Secondary | ICD-10-CM | POA: Diagnosis not present

## 2020-03-29 DIAGNOSIS — Z0000 Encounter for general adult medical examination without abnormal findings: Secondary | ICD-10-CM | POA: Diagnosis not present

## 2020-03-29 DIAGNOSIS — Z8739 Personal history of other diseases of the musculoskeletal system and connective tissue: Secondary | ICD-10-CM | POA: Diagnosis not present

## 2020-04-19 DIAGNOSIS — M47816 Spondylosis without myelopathy or radiculopathy, lumbar region: Secondary | ICD-10-CM | POA: Diagnosis not present

## 2020-04-19 DIAGNOSIS — M545 Low back pain: Secondary | ICD-10-CM | POA: Diagnosis not present

## 2020-04-19 DIAGNOSIS — I10 Essential (primary) hypertension: Secondary | ICD-10-CM | POA: Diagnosis not present

## 2020-04-19 DIAGNOSIS — M5136 Other intervertebral disc degeneration, lumbar region: Secondary | ICD-10-CM | POA: Diagnosis not present

## 2020-05-02 DIAGNOSIS — Z79899 Other long term (current) drug therapy: Secondary | ICD-10-CM | POA: Diagnosis not present

## 2020-05-02 DIAGNOSIS — H16223 Keratoconjunctivitis sicca, not specified as Sjogren's, bilateral: Secondary | ICD-10-CM | POA: Diagnosis not present

## 2020-05-02 DIAGNOSIS — H0102B Squamous blepharitis left eye, upper and lower eyelids: Secondary | ICD-10-CM | POA: Diagnosis not present

## 2020-05-02 DIAGNOSIS — H0102A Squamous blepharitis right eye, upper and lower eyelids: Secondary | ICD-10-CM | POA: Diagnosis not present

## 2020-05-06 ENCOUNTER — Emergency Department (HOSPITAL_COMMUNITY): Payer: Medicare Other

## 2020-05-06 ENCOUNTER — Inpatient Hospital Stay (HOSPITAL_COMMUNITY)
Admission: EM | Admit: 2020-05-06 | Discharge: 2020-05-11 | DRG: 389 | Disposition: A | Discharge status: Home / Self Care | Payer: Medicare Other | Attending: Internal Medicine | Admitting: Internal Medicine

## 2020-05-06 ENCOUNTER — Encounter (HOSPITAL_COMMUNITY): Payer: Self-pay | Admitting: Emergency Medicine

## 2020-05-06 ENCOUNTER — Inpatient Hospital Stay (HOSPITAL_COMMUNITY): Payer: Medicare Other

## 2020-05-06 ENCOUNTER — Other Ambulatory Visit: Payer: Self-pay

## 2020-05-06 DIAGNOSIS — R109 Unspecified abdominal pain: Secondary | ICD-10-CM | POA: Diagnosis not present

## 2020-05-06 DIAGNOSIS — K5900 Constipation, unspecified: Secondary | ICD-10-CM | POA: Diagnosis not present

## 2020-05-06 DIAGNOSIS — Z452 Encounter for adjustment and management of vascular access device: Secondary | ICD-10-CM | POA: Diagnosis not present

## 2020-05-06 DIAGNOSIS — I2510 Atherosclerotic heart disease of native coronary artery without angina pectoris: Secondary | ICD-10-CM | POA: Diagnosis not present

## 2020-05-06 DIAGNOSIS — I340 Nonrheumatic mitral (valve) insufficiency: Secondary | ICD-10-CM | POA: Diagnosis not present

## 2020-05-06 DIAGNOSIS — R0602 Shortness of breath: Secondary | ICD-10-CM

## 2020-05-06 DIAGNOSIS — N189 Chronic kidney disease, unspecified: Secondary | ICD-10-CM | POA: Diagnosis not present

## 2020-05-06 DIAGNOSIS — R000 Tachycardia, unspecified: Secondary | ICD-10-CM | POA: Diagnosis not present

## 2020-05-06 DIAGNOSIS — M1990 Unspecified osteoarthritis, unspecified site: Secondary | ICD-10-CM | POA: Diagnosis present

## 2020-05-06 DIAGNOSIS — I10 Essential (primary) hypertension: Secondary | ICD-10-CM | POA: Diagnosis not present

## 2020-05-06 DIAGNOSIS — I480 Paroxysmal atrial fibrillation: Secondary | ICD-10-CM | POA: Diagnosis not present

## 2020-05-06 DIAGNOSIS — D638 Anemia in other chronic diseases classified elsewhere: Secondary | ICD-10-CM | POA: Diagnosis not present

## 2020-05-06 DIAGNOSIS — R933 Abnormal findings on diagnostic imaging of other parts of digestive tract: Secondary | ICD-10-CM | POA: Diagnosis not present

## 2020-05-06 DIAGNOSIS — Z7982 Long term (current) use of aspirin: Secondary | ICD-10-CM | POA: Diagnosis not present

## 2020-05-06 DIAGNOSIS — B258 Other cytomegaloviral diseases: Secondary | ICD-10-CM | POA: Diagnosis present

## 2020-05-06 DIAGNOSIS — K56699 Other intestinal obstruction unspecified as to partial versus complete obstruction: Principal | ICD-10-CM | POA: Diagnosis present

## 2020-05-06 DIAGNOSIS — N1832 Chronic kidney disease, stage 3b: Secondary | ICD-10-CM

## 2020-05-06 DIAGNOSIS — K6289 Other specified diseases of anus and rectum: Secondary | ICD-10-CM | POA: Diagnosis not present

## 2020-05-06 DIAGNOSIS — R6510 Systemic inflammatory response syndrome (SIRS) of non-infectious origin without acute organ dysfunction: Secondary | ICD-10-CM | POA: Diagnosis present

## 2020-05-06 DIAGNOSIS — Z8249 Family history of ischemic heart disease and other diseases of the circulatory system: Secondary | ICD-10-CM

## 2020-05-06 DIAGNOSIS — K6389 Other specified diseases of intestine: Secondary | ICD-10-CM | POA: Diagnosis not present

## 2020-05-06 DIAGNOSIS — M069 Rheumatoid arthritis, unspecified: Secondary | ICD-10-CM | POA: Diagnosis present

## 2020-05-06 DIAGNOSIS — Z8673 Personal history of transient ischemic attack (TIA), and cerebral infarction without residual deficits: Secondary | ICD-10-CM | POA: Diagnosis not present

## 2020-05-06 DIAGNOSIS — K648 Other hemorrhoids: Secondary | ICD-10-CM | POA: Diagnosis not present

## 2020-05-06 DIAGNOSIS — K219 Gastro-esophageal reflux disease without esophagitis: Secondary | ICD-10-CM | POA: Diagnosis present

## 2020-05-06 DIAGNOSIS — I129 Hypertensive chronic kidney disease with stage 1 through stage 4 chronic kidney disease, or unspecified chronic kidney disease: Secondary | ICD-10-CM | POA: Diagnosis present

## 2020-05-06 DIAGNOSIS — Z7902 Long term (current) use of antithrombotics/antiplatelets: Secondary | ICD-10-CM | POA: Diagnosis not present

## 2020-05-06 DIAGNOSIS — R739 Hyperglycemia, unspecified: Secondary | ICD-10-CM | POA: Diagnosis not present

## 2020-05-06 DIAGNOSIS — N182 Chronic kidney disease, stage 2 (mild): Secondary | ICD-10-CM | POA: Diagnosis not present

## 2020-05-06 DIAGNOSIS — N179 Acute kidney failure, unspecified: Secondary | ICD-10-CM | POA: Diagnosis not present

## 2020-05-06 DIAGNOSIS — K5730 Diverticulosis of large intestine without perforation or abscess without bleeding: Secondary | ICD-10-CM | POA: Diagnosis not present

## 2020-05-06 DIAGNOSIS — K529 Noninfective gastroenteritis and colitis, unspecified: Secondary | ICD-10-CM | POA: Diagnosis not present

## 2020-05-06 DIAGNOSIS — K920 Hematemesis: Secondary | ICD-10-CM | POA: Diagnosis not present

## 2020-05-06 DIAGNOSIS — Z20822 Contact with and (suspected) exposure to covid-19: Secondary | ICD-10-CM | POA: Diagnosis not present

## 2020-05-06 DIAGNOSIS — Z5309 Procedure and treatment not carried out because of other contraindication: Secondary | ICD-10-CM | POA: Diagnosis not present

## 2020-05-06 DIAGNOSIS — N183 Chronic kidney disease, stage 3 unspecified: Secondary | ICD-10-CM | POA: Diagnosis present

## 2020-05-06 DIAGNOSIS — A0839 Other viral enteritis: Secondary | ICD-10-CM | POA: Diagnosis present

## 2020-05-06 DIAGNOSIS — I471 Supraventricular tachycardia: Secondary | ICD-10-CM | POA: Diagnosis not present

## 2020-05-06 DIAGNOSIS — K56609 Unspecified intestinal obstruction, unspecified as to partial versus complete obstruction: Secondary | ICD-10-CM | POA: Diagnosis present

## 2020-05-06 DIAGNOSIS — Z4682 Encounter for fitting and adjustment of non-vascular catheter: Secondary | ICD-10-CM | POA: Diagnosis not present

## 2020-05-06 DIAGNOSIS — K921 Melena: Secondary | ICD-10-CM | POA: Diagnosis not present

## 2020-05-06 DIAGNOSIS — E876 Hypokalemia: Secondary | ICD-10-CM | POA: Diagnosis not present

## 2020-05-06 DIAGNOSIS — Z79899 Other long term (current) drug therapy: Secondary | ICD-10-CM | POA: Diagnosis not present

## 2020-05-06 DIAGNOSIS — Z7952 Long term (current) use of systemic steroids: Secondary | ICD-10-CM

## 2020-05-06 DIAGNOSIS — Z0189 Encounter for other specified special examinations: Secondary | ICD-10-CM

## 2020-05-06 DIAGNOSIS — K56600 Partial intestinal obstruction, unspecified as to cause: Secondary | ICD-10-CM | POA: Diagnosis not present

## 2020-05-06 DIAGNOSIS — K5190 Ulcerative colitis, unspecified, without complications: Secondary | ICD-10-CM | POA: Diagnosis not present

## 2020-05-06 LAB — URINALYSIS, ROUTINE W REFLEX MICROSCOPIC
Glucose, UA: NEGATIVE mg/dL
Hgb urine dipstick: NEGATIVE
Ketones, ur: NEGATIVE mg/dL
Leukocytes,Ua: NEGATIVE
Nitrite: NEGATIVE
Protein, ur: NEGATIVE mg/dL
Specific Gravity, Urine: >1.03 — ABNORMAL HIGH (ref 1.005–1.030)
pH: 5.5 (ref 5.0–8.0)

## 2020-05-06 LAB — COMPREHENSIVE METABOLIC PANEL
ALT: 15 U/L (ref 0–44)
AST: 19 U/L (ref 15–41)
Albumin: 4 g/dL (ref 3.5–5.0)
Alkaline Phosphatase: 107 U/L (ref 38–126)
Anion gap: 12 (ref 5–15)
BUN: 32 mg/dL — ABNORMAL HIGH (ref 8–23)
CO2: 23 mmol/L (ref 22–32)
Calcium: 9.8 mg/dL (ref 8.9–10.3)
Chloride: 103 mmol/L (ref 98–111)
Creatinine, Ser: 1.74 mg/dL — ABNORMAL HIGH (ref 0.44–1.00)
GFR calc Af Amer: 35 mL/min — ABNORMAL LOW (ref >60)
GFR calc non Af Amer: 30 mL/min — ABNORMAL LOW (ref >60)
Glucose, Bld: 165 mg/dL — ABNORMAL HIGH (ref 70–99)
Potassium: 3.8 mmol/L (ref 3.5–5.1)
Sodium: 138 mmol/L (ref 135–145)
Total Bilirubin: 0.5 mg/dL (ref 0.3–1.2)
Total Protein: 7.9 g/dL (ref 6.5–8.1)

## 2020-05-06 LAB — HEMOGLOBIN AND HEMATOCRIT, BLOOD
HCT: 35.8 % — ABNORMAL LOW (ref 36.0–46.0)
Hemoglobin: 11.5 g/dL — ABNORMAL LOW (ref 12.0–15.0)

## 2020-05-06 LAB — CBC
HCT: 37.7 % (ref 36.0–46.0)
Hemoglobin: 12.3 g/dL (ref 12.0–15.0)
MCH: 30.1 pg (ref 26.0–34.0)
MCHC: 32.6 g/dL (ref 30.0–36.0)
MCV: 92.4 fL (ref 80.0–100.0)
Platelets: 257 10*3/uL (ref 150–400)
RBC: 4.08 MIL/uL (ref 3.87–5.11)
RDW: 13.3 % (ref 11.5–15.5)
WBC: 14.9 10*3/uL — ABNORMAL HIGH (ref 4.0–10.5)
nRBC: 0 % (ref 0.0–0.2)

## 2020-05-06 LAB — SARS CORONAVIRUS 2 BY RT PCR (HOSPITAL ORDER, PERFORMED IN ~~LOC~~ HOSPITAL LAB): SARS Coronavirus 2: NEGATIVE

## 2020-05-06 LAB — LACTIC ACID, PLASMA: Lactic Acid, Venous: 2.8 mmol/L (ref 0.5–1.9)

## 2020-05-06 LAB — TYPE AND SCREEN
ABO/RH(D): O POS
Antibody Screen: NEGATIVE

## 2020-05-06 LAB — C-REACTIVE PROTEIN: CRP: 0.6 mg/dL (ref <1.0)

## 2020-05-06 LAB — POC OCCULT BLOOD, ED: Fecal Occult Bld: NEGATIVE

## 2020-05-06 LAB — LIPASE, BLOOD: Lipase: 26 U/L (ref 11–51)

## 2020-05-06 MED ORDER — ALBUTEROL SULFATE (2.5 MG/3ML) 0.083% IN NEBU: 2.5000 mg | INHALATION_SOLUTION | Freq: Four times a day (QID) | RESPIRATORY_TRACT | Status: DC | PRN

## 2020-05-06 MED ORDER — PANTOPRAZOLE SODIUM 40 MG IV SOLR
40.0000 mg | Freq: Two times a day (BID) | INTRAVENOUS | Status: DC
  Administered 2020-05-06 – 2020-05-11 (×10): 40 mg via INTRAVENOUS
  Filled 2020-05-06 (×10): qty 40

## 2020-05-06 MED ORDER — SODIUM CHLORIDE 0.9% FLUSH: 3.0000 mL | Freq: Once | INTRAVENOUS | Status: DC

## 2020-05-06 MED ORDER — MORPHINE SULFATE (PF) 2 MG/ML IV SOLN
2.0000 mg | INTRAVENOUS | Status: DC | PRN
  Administered 2020-05-07 – 2020-05-10 (×8): 2 mg via INTRAVENOUS
  Filled 2020-05-06 (×8): qty 1

## 2020-05-06 MED ORDER — FAMOTIDINE IN NACL 20-0.9 MG/50ML-% IV SOLN
20.0000 mg | Freq: Once | INTRAVENOUS | Status: AC
  Administered 2020-05-06: 20 mg via INTRAVENOUS
  Filled 2020-05-06: qty 50

## 2020-05-06 MED ORDER — SODIUM CHLORIDE 0.9 % IV SOLN: INTRAVENOUS | Status: DC

## 2020-05-06 MED ORDER — ONDANSETRON HCL 4 MG PO TABS: 4.0000 mg | ORAL_TABLET | Freq: Four times a day (QID) | ORAL | Status: DC | PRN

## 2020-05-06 MED ORDER — ACETAMINOPHEN 325 MG PO TABS: 650.0000 mg | ORAL_TABLET | Freq: Four times a day (QID) | ORAL | Status: DC | PRN

## 2020-05-06 MED ORDER — ONDANSETRON HCL 4 MG/2ML IJ SOLN
4.0000 mg | Freq: Four times a day (QID) | INTRAMUSCULAR | Status: DC | PRN
  Administered 2020-05-10: 4 mg via INTRAVENOUS
  Filled 2020-05-06: qty 2

## 2020-05-06 MED ORDER — SODIUM CHLORIDE 0.9% FLUSH
3.0000 mL | Freq: Two times a day (BID) | INTRAVENOUS | Status: DC
  Administered 2020-05-06: 3 mL via INTRAVENOUS

## 2020-05-06 MED ORDER — ONDANSETRON HCL 4 MG/2ML IJ SOLN
4.0000 mg | Freq: Once | INTRAMUSCULAR | Status: AC
  Administered 2020-05-06: 4 mg via INTRAVENOUS
  Filled 2020-05-06: qty 2

## 2020-05-06 MED ORDER — HYDRALAZINE HCL 20 MG/ML IJ SOLN
10.0000 mg | INTRAMUSCULAR | Status: DC | PRN
  Administered 2020-05-06 – 2020-05-08 (×3): 10 mg via INTRAVENOUS
  Filled 2020-05-06 (×4): qty 1

## 2020-05-06 MED ORDER — ACETAMINOPHEN 650 MG RE SUPP: 650.0000 mg | Freq: Four times a day (QID) | RECTAL | Status: DC | PRN

## 2020-05-06 MED ORDER — LORAZEPAM 2 MG/ML IJ SOLN
0.5000 mg | Freq: Every evening | INTRAMUSCULAR | Status: DC | PRN
  Administered 2020-05-06: 0.5 mg via INTRAVENOUS
  Filled 2020-05-06: qty 1

## 2020-05-06 MED ORDER — SODIUM CHLORIDE 0.9 % IV BOLUS
1000.0000 mL | Freq: Once | INTRAVENOUS | Status: AC
  Administered 2020-05-06: 1000 mL via INTRAVENOUS

## 2020-05-06 MED ORDER — DIATRIZOATE MEGLUMINE & SODIUM 66-10 % PO SOLN
90.0000 mL | Freq: Once | ORAL | Status: AC
  Administered 2020-05-06: 90 mL via NASOGASTRIC
  Filled 2020-05-06 (×2): qty 90

## 2020-05-06 NOTE — Consult Note (Signed)
Surgical Evaluation Requesting provider: Dr. Charm Barges  Chief Complaint: constipation  HPI: Very pleasant 69 year old woman with a history of rheumatoid arthritis, hypertension, chronic kidney disease depression, and multiple prior admissions for colitis in the last year presents to the [facility] emergency department with complaint of no bowel movement in the last 15 days.  Reports may be some flatus 3 to 4 days ago.  Endorses multiple episodes of emesis.  She has tried milk of magnesia without improvement.  She denies abdominal pain.  She was admitted in May 2020 with diffuse colitis complicated by acute kidney injury and hypotension requiring pressors. She was admitted at [facility] again June 2020 and diagnosed with CMV colitis. She was admitted again to [facility] multiple times from July-December 2020 with weakness/deconditioning, AKI from poor PO intake, nausea/emesis and recalcitrant CMV colitis. Last c-scope by Dr. Levora Angel in Dec 2020 with poor prep and mild inflammation in distal transverse colon. CT scan Jan 2021 at [facility] demonstrated circumferential thickening of distal T-colon and proximal descending colon with mild adjacent stranding.  This resolved on CT scan in May 2021 here.  Today she has undergone another CT scan which demonstrates significant stool burden throughout the right and transverse colon, with an abrupt transition in the region of the splenic flexure.  Final read is pending.   No Known Allergies  Past Medical History:  Diagnosis Date  . Acute on chronic kidney failure (HCC)   . Anemia   . Arthritis   . Chest pain   . Diverticulitis   . Hypertension   . Stroke (HCC)   . UTI (urinary tract infection) 10/2019    Past Surgical History:  Procedure Laterality Date  . BIOPSY  04/01/2019   Procedure: BIOPSY;  Surgeon: Kathi Der, MD;  Location: [facility] ENDOSCOPY;  Service: Gastroenterology;;  . BIOPSY  10/05/2019   Procedure: BIOPSY;  Surgeon: Kathi Der, MD;  Location:  [facility] ENDOSCOPY;  Service: Gastroenterology;;  . COLONOSCOPY WITH PROPOFOL N/A 04/01/2019   Procedure: COLONOSCOPY WITH PROPOFOL;  Surgeon: Kathi Der, MD;  Location: [facility] ENDOSCOPY;  Service: Gastroenterology;  Laterality: N/A;  . COLONOSCOPY WITH PROPOFOL N/A 10/05/2019   Procedure: COLONOSCOPY WITH PROPOFOL;  Surgeon: Kathi Der, MD;  Location: [facility] ENDOSCOPY;  Service: Gastroenterology;  Laterality: N/A;  . ESOPHAGOGASTRODUODENOSCOPY (EGD) WITH PROPOFOL N/A 04/01/2019   Procedure: ESOPHAGOGASTRODUODENOSCOPY (EGD) WITH PROPOFOL;  Surgeon: Kathi Der, MD;  Location: [facility] ENDOSCOPY;  Service: Gastroenterology;  Laterality: N/A;  . ESOPHAGOGASTRODUODENOSCOPY (EGD) WITH PROPOFOL N/A 10/05/2019   Procedure: ESOPHAGOGASTRODUODENOSCOPY (EGD) WITH PROPOFOL;  Surgeon: Kathi Der, MD;  Location: [facility] ENDOSCOPY;  Service: Gastroenterology;  Laterality: N/A;  . TONSILLECTOMY      Family History  Problem Relation Age of Onset  . Hypertension Mother   . Diabetes Mother   . CAD Father        died of MI at age 61  . Hypertension Father   . Diabetes Sister   . Diabetes Sister   . Kidney disease Neg Hx     Social History   Socioeconomic History  . Marital status: Divorced    Spouse name: Not on file  . Number of children: Not on file  . Years of education: Not on file  . Highest education level: Not on file  Occupational History  . Not on file  Tobacco Use  . Smoking status: Never Smoker  . Smokeless tobacco: Never Used  Vaping Use  . Vaping Use: Never used  Substance and Sexual Activity  . Alcohol use: Yes  Alcohol/week: 21.0 standard drinks    Types: 21 Glasses of wine per week    Comment: sometimes   . Drug use: No  . Sexual activity: Yes    Partners: Male    Birth control/protection: Condom  Other Topics Concern  . Not on file  Social History Narrative  . Not on file   Social Determinants of Health   Financial Resource Strain:   . Difficulty of Paying Living  Expenses:   Food Insecurity:   . Worried About Programme researcher, broadcasting/film/video in the Last Year:   . Barista in the Last Year:   Transportation Needs:   . Freight forwarder (Medical):   Marland Kitchen Lack of Transportation (Non-Medical):   Physical Activity:   . Days of Exercise per Week:   . Minutes of Exercise per Session:   Stress:   . Feeling of Stress :   Social Connections:   . Frequency of Communication with Friends and Family:   . Frequency of Social Gatherings with Friends and Family:   . Attends Religious Services:   . Active Member of Clubs or Organizations:   . Attends Banker Meetings:   Marland Kitchen Marital Status:     No current facility-administered medications on file prior to encounter.   Current Outpatient Medications on File Prior to Encounter  Medication Sig Dispense Refill  . amLODipine (NORVASC) 10 MG tablet Take 10 mg by mouth daily.    Marland Kitchen aspirin EC 81 MG EC tablet Take 1 tablet (81 mg total) by mouth daily. 30 tablet 0  . chlorproMAZINE (THORAZINE) 25 MG tablet Take 1 tablet (25 mg total) by mouth 3 (three) times daily as needed for hiccoughs. 6 tablet 0  . clopidogrel (PLAVIX) 75 MG tablet Take 1 tablet (75 mg total) by mouth daily. 30 tablet 0  . colestipol (COLESTID) 1 g tablet Take 1 tablet (1 g total) by mouth 2 (two) times daily. 30 tablet 0  . docusate sodium (COLACE) 100 MG capsule Take 1 capsule (100 mg total) by mouth 2 (two) times daily. 10 capsule 0  . DULoxetine (CYMBALTA) 60 MG capsule Take 60 mg by mouth at bedtime.     . feeding supplement, ENSURE ENLIVE, (ENSURE ENLIVE) LIQD Take 237 mLs by mouth 3 (three) times daily between meals. 237 mL 12  . HYDROcodone-acetaminophen (NORCO/VICODIN) 5-325 MG tablet Take 1 tablet by mouth 3 (three) times daily as needed.    . hydroxychloroquine (PLAQUENIL) 200 MG tablet Take 200 mg by mouth 2 (two) times daily.   0  . megestrol (MEGACE) 400 MG/10ML suspension Take 10 mLs (400 mg total) by mouth 2 (two) times  daily. 240 mL 0  . metoprolol tartrate (LOPRESSOR) 100 MG tablet Take 100 mg by mouth daily.     . Multiple Vitamin (MULTIVITAMIN WITH MINERALS) TABS tablet Take 1 tablet by mouth daily. 30 tablet 2  . MYRBETRIQ 25 MG TB24 tablet Take 25 mg by mouth daily.    Marland Kitchen omeprazole (PRILOSEC) 20 MG capsule Take 20 mg by mouth daily as needed (acid reflux).     . ondansetron (ZOFRAN) 4 MG tablet Take 1 tablet (4 mg total) by mouth every 6 (six) hours as needed for nausea. (Patient taking differently: Take 4 mg by mouth every 6 (six) hours as needed for nausea or vomiting. ) 20 tablet 0  . tiZANidine (ZANAFLEX) 4 MG tablet Take 4 mg by mouth 2 (two) times daily.      Review  of Systems: a complete, 10pt review of systems was completed with pertinent positives and negatives as documented in the HPI  Physical Exam: Vitals:   05/06/20 1008 05/06/20 1120  BP: (!) 142/113 (!) 144/117  Pulse: (!) 114 (!) 114  Resp: 17 17  Temp:    SpO2: 99% 100%   Gen: A&Ox3, no distress  Eyes: lids and conjunctivae normal, no icterus. Pupils equally round and reactive to light.  Neck: supple without mass or thyromegaly Chest: respiratory effort is normal. No crepitus or tenderness on palpation of the chest. Breath sounds equal.  Cardiovascular: RRR with palpable distal pulses, no pedal edema Gastrointestinal: soft, mildly distended, nontender. No mass, hepatomegaly or splenomegaly.  Tiny umbilical hernia. Lymphatic: no lymphadenopathy in the neck or groin Muscoloskeletal: no clubbing or cyanosis of the fingers.  Strength is symmetrical throughout.  Range of motion of bilateral upper and lower extremities normal without pain, crepitation or contracture. Neuro: cranial nerves grossly intact.  Sensation intact to light touch diffusely. Psych: appropriate mood and affect, normal insight/judgment intact  Skin: warm and dry   CBC Latest Ref Rng & Units 05/06/2020 03/16/2020 11/12/2019  WBC 4.0 - 10.5 K/uL 14.9(H) 10.5 9.7   Hemoglobin 12.0 - 15.0 g/dL 50.2 77.4 1.2(I)  Hematocrit 36 - 46 % 37.7 37.8 30.5(L)  Platelets 150 - 400 K/uL 257 210 323    CMP Latest Ref Rng & Units 05/06/2020 03/16/2020 11/12/2019  Glucose 70 - 99 mg/dL 786(V) 672(C) 947(S)  BUN 8 - 23 mg/dL 96(G) 83(M) 15  Creatinine 0.44 - 1.00 mg/dL 6.29(U) 7.65(Y) 6.50(P)  Sodium 135 - 145 mmol/L 138 139 141  Potassium 3.5 - 5.1 mmol/L 3.8 5.2(H) 3.5  Chloride 98 - 111 mmol/L 103 104 101  CO2 22 - 32 mmol/L 23 23 27   Calcium 8.9 - 10.3 mg/dL 9.8 9.5 54.6  Total Protein 6.5 - 8.1 g/dL 7.9 7.3 7.7  Total Bilirubin 0.3 - 1.2 mg/dL 0.5 1.0 0.7  Alkaline Phos 38 - 126 U/L 107 105 56  AST 15 - 41 U/L 19 21 21   ALT 0 - 44 U/L 15 18 18     Lab Results  Component Value Date   INR 1.0 03/24/2019   INR 1.28 08/27/2016   INR 1.0 12/07/2008    Imaging: No results found.   A/P: 69 year old woman with multiple medical problems and multiple hospital admissions in the last year with known history of CMV colitis and history of inflammatory change in the distal transverse colon/proximal descending colon now with a CT scan demonstrating a partial obstruction at that level. Abdominal exam is benign.  As she is nauseated and having periodic emesis, I would recommend NG tube placement, gentle fluid resuscitation, GI evaluation.  Suspect post inflammatory stenosis in the region of concern given findings on imaging and endoscopy earlier this year, no obvious mass on CT but this can be further evaluated with colonoscopy.  Discussed with patient that pending further evaluation and clinical course, she may require surgical intervention for resection/colostomy.  Surgery will continue to follow.   Patient Active Problem List   Diagnosis Date Noted  . UTI (urinary tract infection) 11/08/2019  . Major depressive disorder, recurrent episode, moderate (HCC) 11/08/2019  . Urinary retention 10/28/2019  . Acute on chronic kidney failure (HCC) 10/27/2019  .  Malnutrition of moderate degree 10/03/2019  . Colitis 09/30/2019  . AKI (acute kidney injury) (HCC) 05/28/2019  . Nausea & vomiting 05/28/2019  . 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 Anemia,  chronic disease 05/28/2019  . Swelling   . Small vessel disease, cerebrovascular   . Acute ischemic stroke (HCC)   . Weakness   . Acute left ankle pain   . Acute on chronic renal failure (HCC) 05/13/2019  . Weakness of both lower extremities 05/13/2019  . Cerebral thrombosis with cerebral infarction 03/31/2019  . New onset a-fib (HCC) 03/27/2019  . Acute kidney injury superimposed on CKD (HCC) 03/24/2019  . Hypotension 03/24/2019  . Dark emesis 03/24/2019  . Dark stools 03/24/2019  . Diverticulitis of intestine without perforation or abscess without bleeding   . Sinus tachycardia 08/27/2016  . Diverticulitis 08/26/2016  . HYPERTENSION, MALIGNANT ESSENTIAL 06/24/2007  . KIDNEY DISEASE, CHRONIC, STAGE III 06/24/2007  . ARTHRITIS, RHEUMATOID, SEROPOSITIVE 06/24/2007       Phylliss Blakes, MD [facility]  See AMION to contact appropriate on-call provider

## 2020-05-06 NOTE — H&P (Addendum)
History and Physical    Michelle Graves:[id number] DOB: 07/02/1951 DOA: 05/06/2020  Referring MD/NP/PA: Donnamarie Rossetti, MD PCP: [facility]  Patient coming from: Home   Chief Complaint: "Vomiting up black stuff"  I have personally briefly reviewed patient's old medical records in [place]   HPI: Michelle Graves is a 69 y.o. female with medical history significant of hypertension, chronic kidney disease stage IIIb, diverticulitis, and CVA presents with complaints of vomiting black stool since yesterday evening.  Over the last 2 weeks she has not had a bowel movement despite trying to.  She still been able to pass flatus but that is all.  Normally she been having bowel movements 2-3 times a day and then it abruptly stopped.  Noted associated symptoms of intermittent right lower quadrant pain, abdominal distention, decreased appetite, early satiety, and weight loss of 8 to 10 pounds over the last couple weeks.  Abdominal pain symptoms seem to be worse at night and would keep her up.  Denies any prior abdominal surgery.  Records note patient had colitis back in May and June 2020.  Then had colonoscopies following diverticulitis in June and December 2020 by dr. Alessandra Bevels.  On the first study patient was noted to have severe mucosal changes of the mucosa concerning for ischemic colitis distal transverse colon.  On the second study patient was noted to have inflamed ulcerated mucosa in the distal transverse colon for which biopsies were taken.  Biopsies did not reveal any signs of malignancy at that time.  ED Course: On admission into the emergency department patient was seen to be afebrile with heart rate 114-130, blood pressures elevated to 154/111, and all other vital signs maintained.  Labs significant for WBC 14.5, BUN 32, creatinine 1.74, glucose 165.  Stool guaiacs were noted to be negative.  Urinalysis was significant for elevated specific gravity  with no signs of infection. CT scanning of the abdomen pelvis without contrast showed significant stool burden with transition point around the region of the splenic flexure, but official report was pending.  General surgery and [facility] have been formally consulted.  Patient had been given Pepcid 20 mg IV, Zofran, and 1 L of normal saline IV fluids.  TRH called to admit.   Review of Systems  Constitutional: Positive for malaise/fatigue and weight loss. Negative for fever.  HENT: Negative for congestion and nosebleeds.   Eyes: Negative for double vision and photophobia.  Respiratory: Negative for cough.   Cardiovascular: Negative for chest pain and leg swelling.  Gastrointestinal: Positive for abdominal pain, constipation, nausea and vomiting.  Genitourinary: Negative for dysuria.  Musculoskeletal: Negative for joint pain and myalgias.  Skin: Negative for rash.  Neurological: Negative for focal weakness and loss of consciousness.  Psychiatric/Behavioral: Negative for substance abuse. The patient has insomnia.     Past Medical History:  Diagnosis Date  . Acute on chronic kidney failure ([place])   . Anemia   . Arthritis   . Chest pain   . Diverticulitis   . Hypertension   . Stroke ([place])   . UTI (urinary tract infection) 10/2019    Past Surgical History:  Procedure Laterality Date  . BIOPSY  04/01/2019   Procedure: BIOPSY;  Surgeon: Otis Brace, MD;  Location: [place];  Service: Gastroenterology;;  . BIOPSY  10/05/2019   Procedure: BIOPSY;  Surgeon: Otis Brace, MD;  Location: [place] ENDOSCOPY;  Service: Gastroenterology;;  . COLONOSCOPY WITH PROPOFOL N/A 04/01/2019   Procedure:  COLONOSCOPY WITH PROPOFOL;  Surgeon: Otis Brace, MD;  Location: [place];  Service: Gastroenterology;  Laterality: N/A;  . COLONOSCOPY WITH PROPOFOL N/A 10/05/2019   Procedure: COLONOSCOPY WITH PROPOFOL;  Surgeon: Otis Brace, MD;  Location: [place];  Service: Gastroenterology;   Laterality: N/A;  . ESOPHAGOGASTRODUODENOSCOPY (EGD) WITH PROPOFOL N/A 04/01/2019   Procedure: ESOPHAGOGASTRODUODENOSCOPY (EGD) WITH PROPOFOL;  Surgeon: Otis Brace, MD;  Location: [facility] ENDOSCOPY;  Service: Gastroenterology;  Laterality: N/A;  . ESOPHAGOGASTRODUODENOSCOPY (EGD) WITH PROPOFOL N/A 10/05/2019   Procedure: ESOPHAGOGASTRODUODENOSCOPY (EGD) WITH PROPOFOL;  Surgeon: Otis Brace, MD;  Location: [facility] ENDOSCOPY;  Service: Gastroenterology;  Laterality: N/A;  . TONSILLECTOMY       reports that she has never smoked. She has never used smokeless tobacco. She reports current alcohol use of about 21.0 standard drinks of alcohol per week. She reports that she does not use drugs.  No Known Allergies  Family History  Problem Relation Age of Onset  . Hypertension Mother   . Diabetes Mother   . CAD Father        died of MI at age 51  . Hypertension Father   . Diabetes Sister   . Diabetes Sister   . Kidney disease Neg Hx     Prior to Admission medications   Medication Sig Start Date End Date Taking? Authorizing Provider  amLODipine (NORVASC) 10 MG tablet Take 10 mg by mouth daily. 08/19/19   [provider]  aspirin EC 81 MG EC tablet Take 1 tablet (81 mg total) by mouth daily. 05/16/19   Jose Persia, MD  chlorproMAZINE (THORAZINE) 25 MG tablet Take 1 tablet (25 mg total) by mouth 3 (three) times daily as needed for hiccoughs. 11/12/19   Charlann Lange, PA-C  clopidogrel (PLAVIX) 75 MG tablet Take 1 tablet (75 mg total) by mouth daily. 04/05/19   Aline August, MD  colestipol (COLESTID) 1 g tablet Take 1 tablet (1 g total) by mouth 2 (two) times daily. 05/16/19   Jose Persia, MD  docusate sodium (COLACE) 100 MG capsule Take 1 capsule (100 mg total) by mouth 2 (two) times daily. 06/02/19   Shahmehdi, Valeria Batman, MD  DULoxetine (CYMBALTA) 60 MG capsule Take 60 mg by mouth at bedtime.     [provider]  feeding supplement, ENSURE ENLIVE, (ENSURE ENLIVE) LIQD Take 237  mLs by mouth 3 (three) times daily between meals. 06/02/19   Shahmehdi, Valeria Batman, MD  HYDROcodone-acetaminophen (NORCO/VICODIN) 5-325 MG tablet Take 1 tablet by mouth 3 (three) times daily as needed. 10/14/19   [provider]  hydroxychloroquine (PLAQUENIL) 200 MG tablet Take 200 mg by mouth 2 (two) times daily.  06/16/16   [provider]  megestrol (MEGACE) 400 MG/10ML suspension Take 10 mLs (400 mg total) by mouth 2 (two) times daily. 06/02/19   Shahmehdi, Valeria Batman, MD  metoprolol tartrate (LOPRESSOR) 100 MG tablet Take 100 mg by mouth daily.  11/19/15   [provider]  Multiple Vitamin (MULTIVITAMIN WITH MINERALS) TABS tablet Take 1 tablet by mouth daily. 06/03/19   Shahmehdi, Valeria Batman, MD  MYRBETRIQ 25 MG TB24 tablet Take 25 mg by mouth daily. 10/18/19   [provider]  omeprazole (PRILOSEC) 20 MG capsule Take 20 mg by mouth daily as needed (acid reflux).  08/22/19   [provider]  ondansetron (ZOFRAN) 4 MG tablet Take 1 tablet (4 mg total) by mouth every 6 (six) hours as needed for nausea. Patient taking differently: Take 4 mg by mouth every 6 (  six) hours as needed for nausea or vomiting.  04/04/19   Aline August, MD  tiZANidine (ZANAFLEX) 4 MG tablet Take 4 mg by mouth 2 (two) times daily. 08/22/19   [provider]    Physical Exam:  Constitutional: NAD, calm, comfortable Vitals:   05/06/20 0753 05/06/20 0914 05/06/20 1008 05/06/20 1120  BP: (!) 138/106 (!) 154/111 (!) 142/113 (!) 144/117  Pulse: (!) 130 (!) 120 (!) 114 (!) 114  Resp: 20 18 17 17   Temp: 98.9 F (37.2 C)     TempSrc: Oral     SpO2: 98% 100% 99% 100%  Weight: 77.1 kg     Height: 5' 6"  (1.676 m)      Eyes: PERRL, lids and conjunctivae normal ENMT: Mucous membranes are moist. Posterior pharynx clear of any exudate or lesions.nasal gastric tube placed with dark coffee-ground appearing fluid draining. Neck: normal, supple, no masses, no thyromegaly Respiratory: clear to  auscultation bilaterally, no wheezing, no crackles. Normal respiratory effort. No accessory muscle use.  Cardiovascular: Regular rate and rhythm, no murmurs / rubs / gallops. No extremity edema. 2+ pedal pulses. No carotid bruits.  Abdomen: Mildly distended abdomen with decreased overall bowel sounds.  No tenderness to palpation appreciated at this time. Musculoskeletal: no clubbing / cyanosis. No joint deformity upper and lower extremities. Good ROM, no contractures. Normal muscle tone.  Skin: no rashes, lesions, ulcers. No induration Neurologic: CN 2-12 grossly intact. Sensation intact, DTR normal. Strength 5/5 in all 4.  Psychiatric: Normal judgment and insight. Alert and oriented x 3. Normal mood.     Labs on Admission: I have personally reviewed following labs and imaging studies  CBC: Recent Labs  Lab 05/06/20 0814  WBC 14.9*  HGB 12.3  HCT 37.7  MCV 92.4  PLT 130   Basic Metabolic Panel: Recent Labs  Lab 05/06/20 0814  NA 138  K 3.8  CL 103  CO2 23  GLUCOSE 165*  BUN 32*  CREATININE 1.74*  CALCIUM 9.8   GFR: Estimated Creatinine Clearance: 32.9 mL/min (A) (by C-G formula based on SCr of 1.74 mg/dL (H)). Liver Function Tests: Recent Labs  Lab 05/06/20 0814  AST 19  ALT 15  ALKPHOS 107  BILITOT 0.5  PROT 7.9  ALBUMIN 4.0   Recent Labs  Lab 05/06/20 0814  LIPASE 26   No results for input(s): AMMONIA in the last 168 hours. Coagulation Profile: No results for input(s): INR, PROTIME in the last 168 hours. Cardiac Enzymes: No results for input(s): CKTOTAL, CKMB, CKMBINDEX, TROPONINI in the last 168 hours. BNP (last 3 results) No results for input(s): PROBNP in the last 8760 hours. HbA1C: No results for input(s): HGBA1C in the last 72 hours. CBG: No results for input(s): GLUCAP in the last 168 hours. Lipid Profile: No results for input(s): CHOL, HDL, LDLCALC, TRIG, CHOLHDL, LDLDIRECT in the last 72 hours. Thyroid Function Tests: No results for  input(s): TSH, T4TOTAL, FREET4, T3FREE, THYROIDAB in the last 72 hours. Anemia Panel: No results for input(s): VITAMINB12, FOLATE, FERRITIN, TIBC, IRON, RETICCTPCT in the last 72 hours. Urine analysis:    Component Value Date/Time   COLORURINE YELLOW 05/06/2020 1148   APPEARANCEUR HAZY (A) 05/06/2020 1148   LABSPEC >1.030 (H) 05/06/2020 1148   PHURINE 5.5 05/06/2020 1148   GLUCOSEU NEGATIVE 05/06/2020 1148   HGBUR NEGATIVE 05/06/2020 1148   BILIRUBINUR SMALL (A) 05/06/2020 1148   KETONESUR NEGATIVE 05/06/2020 1148   PROTEINUR NEGATIVE 05/06/2020 1148   UROBILINOGEN 1.0 06/24/2010 1850  NITRITE NEGATIVE 05/06/2020 1148   LEUKOCYTESUR NEGATIVE 05/06/2020 1148   Sepsis Labs: No results found for this or any previous visit (from the past 240 hour(s)).   Radiological Exams on Admission: No results found.  EKG: Independently reviewed.  Sinus tachycardia 122 bpm   Assessment/Plan  Bowel obstruction: Acute.  Patient presented with complaints of nausea, vomiting, and lower abdominal pain.  Patient with prior history of diverticulitis.  Prior colonoscopies done by [facility] no areas of severe mucosal inflammation along the transverse colon for which previous biopsies were negative for malignancy.  Initial review of CT scan of the abdomen and pelvis without contrast noted signs significant stool burden throughout the right and transverse colon with an abrupt transition in the region of the splenic flexure.  Question stricture vs. obstructing mass. -Admit to a medical telemetry bed -N.p.o. -NGT to suction -Follow-up for official radiology reading of CT scan abdomen and pelvis -Normal saline IV fluids 100 ml/hr  -IV morphine as needed for pain -Appreciate gastroenterology and general surgery consultative services we will follow-up for further recommendations  SIRS/Leukocytosis: WBC elevated 14.9 patient initially tachycardic meeting SIRS criteria.  Urinalysis was negative for any acute  abnormalities.  Tachycardia improving with IV fluids alone. -Add on lactic acid, ESR, and CRP. -Recheck CBC in a.m.  Suspected hematemesis: Acute.  Patient had reportedly been vomiting coffee-ground emesis.  Patient having given Pepcid 20 mg IV. -Type and screen for concern for possible need of blood products -Check occult blood gastric  -Continue tonics 40 mg IV -Serial monitoring of H&H  Essential hypertension: Blood pressures initially noted to be elevated up to 154/111. -Hydralazine IV as needed elevated blood pressure  Chronic kidney disease stage IIIb: Creatinine noted to be 1.74 which appears around patient's baseline. -Continue to monitor  GERD prophylaxis -Protonix IV 40 mg IV twice daily  DVT prophylaxis: SCDs Code Status: Full Family Communication: Discussed plan of care with the patient daughter over the phone Disposition Plan:   To be determined Consults called: GI and general surgery Admission status: Inpatient  Norval Morton MD [facility] Pager [phone]   If 7PM-7AM, please contact night-coverage [email or web address] Password [facility]  05/06/2020, 2:29 PM

## 2020-05-06 NOTE — Progress Notes (Signed)
Michelle Graves is a 69 y.o. female patient admitted. Awake, alert - oriented  X 4 - no acute distress noted.  VSS - Blood pressure (!) 136/93, pulse (!) 135, temperature 99.7 F (37.6 C), resp. rate 17, height 5\' 6"  (1.676 m), weight 77.1 kg, SpO2 96 %.    IV in place, occlusive dsg intact without redness.Orientation to room, and floor completed.  Admission INP armband ID verified with patient/family, and in place.   SR up x 2, fall assessment complete, with patient and family able to verbalize understanding of risk associated with falls, and verbalized understanding to call nsg before up out of bed.  Call light within reach, patient able to voice, and demonstrate understanding. No evidence of skin break down noted on exam.  Admission nurse notified of admission.     Will cont to eval and treat per MD orders.  , RN 05/06/2020 10:18 PM

## 2020-05-06 NOTE — ED Notes (Signed)
Patient transported to CT 

## 2020-05-06 NOTE — ED Provider Notes (Signed)
[facility] EMERGENCY DEPARTMENT Provider Note   CSN: [id number] Arrival date & time: 05/06/20  0753     History Chief Complaint  Patient presents with  . Emesis  . Constipation    Michelle Graves is a 69 y.o. female.  She is complaining of not having a bowel movement in the last 15 days.  She said she has tried some milk of magnesia without any improvement.  Now having some crampy low abdominal pain and has been vomiting black material since 6 PM last night, multiple episodes.  No chest pain.  Mild shortness of breath.  No fevers or chills.  No urinary symptoms recent falls or rashes.  The history is provided by the patient.  Emesis Severity:  Moderate Duration:  20 hours Timing:  Intermittent Emesis appearance: black? Progression:  Unchanged Chronicity:  New Recent urination:  Decreased Relieved by:  Nothing Worsened by:  Nothing Ineffective treatments:  None tried Associated symptoms: abdominal pain   Associated symptoms: no chills, no cough, no diarrhea, no fever, no headaches and no sore throat   Constipation Severity:  Severe Time since last bowel movement:  15 days Progression:  Unchanged Chronicity:  New Context: narcotics   Relieved by:  Nothing Worsened by:  Nothing Ineffective treatments:  Laxatives Associated symptoms: abdominal pain, nausea and vomiting   Associated symptoms: no diarrhea, no dysuria and no fever        Past Medical History:  Diagnosis Date  . Acute on chronic kidney failure (HCC)   . Anemia   . Arthritis   . Chest pain   . Diverticulitis   . Hypertension   . Stroke (HCC)   . UTI (urinary tract infection) 10/2019    Patient Active Problem List   Diagnosis Date Noted  . UTI (urinary tract infection) 11/08/2019  . Major depressive disorder, recurrent episode, moderate (HCC) 11/08/2019  . Urinary retention 10/28/2019  . Acute on chronic kidney failure (HCC) 10/27/2019  . Malnutrition of moderate degree  10/03/2019  . Colitis 09/30/2019  . AKI (acute kidney injury) (HCC) 05/28/2019  . Nausea & vomiting 05/28/2019  . 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 Anemia, chronic disease 05/28/2019  . Swelling   . Small vessel disease, cerebrovascular   . Acute ischemic stroke (HCC)   . Weakness   . Acute left ankle pain   . Acute on chronic renal failure (HCC) 05/13/2019  . Weakness of both lower extremities 05/13/2019  . Cerebral thrombosis with cerebral infarction 03/31/2019  . New onset a-fib (HCC) 03/27/2019  . Acute kidney injury superimposed on CKD (HCC) 03/24/2019  . Hypotension 03/24/2019  . Dark emesis 03/24/2019  . Dark stools 03/24/2019  . Diverticulitis of intestine without perforation or abscess without bleeding   . Sinus tachycardia 08/27/2016  . Diverticulitis 08/26/2016  . HYPERTENSION, MALIGNANT ESSENTIAL 06/24/2007  . KIDNEY DISEASE, CHRONIC, STAGE III 06/24/2007  . ARTHRITIS, RHEUMATOID, SEROPOSITIVE 06/24/2007    Past Surgical History:  Procedure Laterality Date  . BIOPSY  04/01/2019   Procedure: BIOPSY;  Surgeon: Kathi Der, MD;  Location: [facility] ENDOSCOPY;  Service: Gastroenterology;;  . BIOPSY  10/05/2019   Procedure: BIOPSY;  Surgeon: Kathi Der, MD;  Location: [facility] ENDOSCOPY;  Service: Gastroenterology;;  . COLONOSCOPY WITH PROPOFOL N/A 04/01/2019   Procedure: COLONOSCOPY WITH PROPOFOL;  Surgeon: Kathi Der, MD;  Location: [facility] ENDOSCOPY;  Service: Gastroenterology;  Laterality: N/A;  . COLONOSCOPY WITH PROPOFOL N/A 10/05/2019   Procedure: COLONOSCOPY WITH PROPOFOL;  Surgeon: Kathi Der, MD;  Location: [facility] ENDOSCOPY;  Service: Gastroenterology;  Laterality: N/A;  . ESOPHAGOGASTRODUODENOSCOPY (EGD) WITH PROPOFOL N/A 04/01/2019   Procedure: ESOPHAGOGASTRODUODENOSCOPY (EGD) WITH PROPOFOL;  Surgeon: Kathi Der, MD;  Location: [facility] ENDOSCOPY;  Service: Gastroenterology;  Laterality: N/A;  . ESOPHAGOGASTRODUODENOSCOPY (EGD) WITH PROPOFOL N/A 10/05/2019    Procedure: ESOPHAGOGASTRODUODENOSCOPY (EGD) WITH PROPOFOL;  Surgeon: Kathi Der, MD;  Location: [facility] ENDOSCOPY;  Service: Gastroenterology;  Laterality: N/A;  . TONSILLECTOMY       OB History   No obstetric history on file.     Family History  Problem Relation Age of Onset  . Hypertension Mother   . Diabetes Mother   . CAD Father        died of MI at age 20  . Hypertension Father   . Diabetes Sister   . Diabetes Sister   . Kidney disease Neg Hx     Social History   Tobacco Use  . Smoking status: Never Smoker  . Smokeless tobacco: Never Used  Vaping Use  . Vaping Use: Never used  Substance Use Topics  . Alcohol use: Yes    Alcohol/week: 21.0 standard drinks    Types: 21 Glasses of wine per week    Comment: sometimes   . Drug use: No    Home Medications Prior to Admission medications   Medication Sig Start Date End Date Taking? Authorizing Provider  amLODipine (NORVASC) 10 MG tablet Take 10 mg by mouth daily. 08/19/19   [provider]  aspirin EC 81 MG EC tablet Take 1 tablet (81 mg total) by mouth daily. 05/16/19   Verdene Lennert, MD  chlorproMAZINE (THORAZINE) 25 MG tablet Take 1 tablet (25 mg total) by mouth 3 (three) times daily as needed for hiccoughs. 11/12/19   Elpidio Anis, PA-C  clopidogrel (PLAVIX) 75 MG tablet Take 1 tablet (75 mg total) by mouth daily. 04/05/19   Glade Lloyd, MD  colestipol (COLESTID) 1 g tablet Take 1 tablet (1 g total) by mouth 2 (two) times daily. 05/16/19   Verdene Lennert, MD  docusate sodium (COLACE) 100 MG capsule Take 1 capsule (100 mg total) by mouth 2 (two) times daily. 06/02/19   Shahmehdi, Gemma Payor, MD  DULoxetine (CYMBALTA) 60 MG capsule Take 60 mg by mouth at bedtime.     [provider]  feeding supplement, ENSURE ENLIVE, (ENSURE ENLIVE) LIQD Take 237 mLs by mouth 3 (three) times daily between meals. 06/02/19   Shahmehdi, Gemma Payor, MD  HYDROcodone-acetaminophen (NORCO/VICODIN) 5-325 MG tablet Take 1 tablet by  mouth 3 (three) times daily as needed. 10/14/19   [provider]  hydroxychloroquine (PLAQUENIL) 200 MG tablet Take 200 mg by mouth 2 (two) times daily.  06/16/16   [provider]  megestrol (MEGACE) 400 MG/10ML suspension Take 10 mLs (400 mg total) by mouth 2 (two) times daily. 06/02/19   Shahmehdi, Gemma Payor, MD  metoprolol tartrate (LOPRESSOR) 100 MG tablet Take 100 mg by mouth daily.  11/19/15   [provider]  Multiple Vitamin (MULTIVITAMIN WITH MINERALS) TABS tablet Take 1 tablet by mouth daily. 06/03/19   Shahmehdi, Gemma Payor, MD  MYRBETRIQ 25 MG TB24 tablet Take 25 mg by mouth daily. 10/18/19   [provider]  omeprazole (PRILOSEC) 20 MG capsule Take 20 mg by mouth daily as needed (acid reflux).  08/22/19   [provider]  ondansetron (ZOFRAN) 4 MG tablet Take 1 tablet (4 mg total) by mouth every 6 (six) hours as needed for nausea. Patient taking differently: Take  4 mg by mouth every 6 (six) hours as needed for nausea or vomiting.  04/04/19   Glade Lloyd, MD  tiZANidine (ZANAFLEX) 4 MG tablet Take 4 mg by mouth 2 (two) times daily. 08/22/19   [provider]    Allergies    Patient has no known allergies.  Review of Systems   Review of Systems  Constitutional: Negative for chills and fever.  HENT: Negative for sore throat.   Eyes: Negative for visual disturbance.  Respiratory: Negative for cough and shortness of breath.   Cardiovascular: Negative for chest pain.  Gastrointestinal: Positive for abdominal pain, constipation, nausea and vomiting. Negative for diarrhea.  Genitourinary: Negative for dysuria.  Musculoskeletal: Negative for neck pain.  Skin: Negative for rash.  Neurological: Negative for headaches.    Physical Exam Updated Vital Signs BP (!) 144/117   Pulse (!) 114   Temp 98.9 F (37.2 C) (Oral)   Resp 17   Ht 5\' 6"  (1.676 m)   Wt 77.1 kg   SpO2 100%   BMI 27.44 kg/m   Physical Exam Vitals and nursing note  reviewed.  Constitutional:      General: She is not in acute distress.    Appearance: Normal appearance. She is well-developed.  HENT:     Head: Normocephalic and atraumatic.  Eyes:     Conjunctiva/sclera: Conjunctivae normal.  Cardiovascular:     Rate and Rhythm: Regular rhythm. Tachycardia present.     Heart sounds: No murmur heard.   Pulmonary:     Effort: Pulmonary effort is normal. No respiratory distress.     Breath sounds: Normal breath sounds.  Abdominal:     Palpations: Abdomen is soft.     Tenderness: There is no abdominal tenderness. There is no guarding or rebound.  Musculoskeletal:        General: No deformity or signs of injury. Normal range of motion.     Cervical back: Neck supple.  Skin:    General: Skin is warm and dry.     Capillary Refill: Capillary refill takes less than 2 seconds.  Neurological:     General: No focal deficit present.     Mental Status: She is alert.     ED Results / Procedures / Treatments   Labs (all labs ordered are listed, but only abnormal results are displayed) Labs Reviewed  COMPREHENSIVE METABOLIC PANEL - Abnormal; Notable for the following components:      Result Value   Glucose, Bld 165 (*)    BUN 32 (*)    Creatinine, Ser 1.74 (*)    GFR calc non Af Amer 30 (*)    GFR calc Af Amer 35 (*)    All other components within normal limits  CBC - Abnormal; Notable for the following components:   WBC 14.9 (*)    All other components within normal limits  URINALYSIS, ROUTINE W REFLEX MICROSCOPIC - Abnormal; Notable for the following components:   APPearance HAZY (*)    Specific Gravity, Urine >1.030 (*)    Bilirubin Urine SMALL (*)    All other components within normal limits  HEMOGLOBIN AND HEMATOCRIT, BLOOD - Abnormal; Notable for the following components:   Hemoglobin 11.5 (*)    HCT 35.8 (*)    All other components within normal limits  SARS CORONAVIRUS 2 BY RT PCR (HOSPITAL ORDER, PERFORMED IN [facility])   LIPASE, BLOOD  OCCULT BLOOD GASTRIC / DUODENUM (SPECIMEN CUP)  LACTIC ACID, PLASMA  SEDIMENTATION RATE  C-REACTIVE PROTEIN  CBC  COMPREHENSIVE METABOLIC PANEL  POC OCCULT BLOOD, ED  TYPE AND SCREEN    EKG EKG Interpretation  Date/Time:  Sunday May 06 2020 08:01:57 EDT Ventricular Rate:  122 PR Interval:  158 QRS Duration: 90 QT Interval:  324 QTC Calculation: 461 R Axis:   -26 Text Interpretation: Sinus tachycardia Inferior infarct , age undetermined Possible Anterior infarct , age undetermined Abnormal ECG rate increased from prior 5/21 Confirmed by Meridee Score [phone]) on 05/06/2020 11:31:09 AM   Radiology No results found.  Procedures Procedures (including critical care time)  Medications Ordered in ED Medications  sodium chloride flush (NS) 0.9 % injection 3 mL (3 mLs Intravenous Not Given 05/06/20 1135)  sodium chloride flush (NS) 0.9 % injection 3 mL (3 mLs Intravenous Not Given 05/06/20 1455)  0.9 %  sodium chloride infusion (has no administration in time range)  ondansetron (ZOFRAN) tablet 4 mg (has no administration in time range)    Or  ondansetron (ZOFRAN) injection 4 mg (has no administration in time range)  acetaminophen (TYLENOL) tablet 650 mg (has no administration in time range)    Or  acetaminophen (TYLENOL) suppository 650 mg (has no administration in time range)  albuterol (PROVENTIL) (2.5 MG/3ML) 0.083% nebulizer solution 2.5 mg (has no administration in time range)  pantoprazole (PROTONIX) injection 40 mg (has no administration in time range)  morphine 2 MG/ML injection 2 mg (has no administration in time range)  diatrizoate meglumine-sodium (GASTROGRAFIN) 66-10 % solution 90 mL (has no administration in time range)  hydrALAZINE (APRESOLINE) injection 10 mg (has no administration in time range)  sodium chloride 0.9 % bolus 1,000 mL (0 mLs Intravenous Stopped 05/06/20 1455)  ondansetron (ZOFRAN) injection 4 mg (4 mg Intravenous Given 05/06/20 1234)   famotidine (PEPCID) IVPB 20 mg premix (0 mg Intravenous Stopped 05/06/20 1455)    ED Course  I have reviewed the triage vital signs and the nursing notes.  Pertinent labs & imaging results that were available during my care of the patient were reviewed by me and considered in my medical decision making (see chart for details).  Clinical Course as of May 06 1712  Sun May 06, 2020  1408 CT Abdomen Pelvis Wo Contrast [MB]  1414 Discussed with Dr. Bing Matter GI.  He recommends the patient be admitted to medicine, NG, n.p.o. and they will consult on her tomorrow.   [MB]  1414 Earlier I had also consulted Dr. Doylene Canard general surgery who felt GI would be more appropriate.  But she will put her on the list.   [MB]  1437 Discussed with Dr. Katrinka Blazing [facility] hospital[facility] who will evaluate the patient for admission.  I updated the patient on the plan and she is agreeable.   [MB]    Clinical Course User Index [MB] Terrilee Files, MD   MDM Rules/Calculators/A&P                          This patient complains of constipation, low abdominal pain, vomiting; this involves an extensive number of treatment Options and is a complaint that carries with it a high risk of complications and Morbidity. The differential includes obstruction, colitis, diverticulitis, volvulus, intussusception, metabolic derangement  I ordered, reviewed and interpreted labs, which included CBC with an elevated white count, normal hemoglobin, chemistries with elevated glucose elevated BUN and creatinine, normal gap, urinalysis concentrated no signs of infection I ordered medication IV fluids Zofran Pepcid I ordered imaging studies  which included CT abdomen pelvis and I independently    visualized and interpreted imaging which showed markedly dilated colon with abrupt transition point Previous records obtained and reviewed in epic, patient had 2 colonoscopies in the past where they were unable to get across the transverse colon. I  consulted GI Dr. Evette Cristal, general surgery Dr. Doylene Canard, [facility] hospitalist Dr. Katrinka Blazing and discussed lab and imaging findings  Critical Interventions: None  After the interventions stated above, I reevaluated the patient and found patient symptoms to be improved.  Have placed order for NG tube.  She is in agreement for admission for further work-up.   Final Clinical Impression(s) / ED Diagnoses Final diagnoses:  [facility] bowel obstruction [facility])    Rx / DC Orders ED Discharge Orders    None       Terrilee Files, MD 05/06/20 1718

## 2020-05-06 NOTE — ED Triage Notes (Signed)
Pt reports nausea and vomiting black emesis since 6pm last night.  States last BM was 14 days ago.  Denies abd pain.

## 2020-05-06 NOTE — Progress Notes (Signed)
   05/06/20 1951  Assess: MEWS Score  Temp 99.4 F (37.4 C)  BP (!) 152/108  Pulse Rate (!) 126  Resp 17  SpO2 97 %  O2 Device Room Air  Assess: MEWS Score  MEWS Temp 0  MEWS Systolic 0  MEWS Pulse 2  MEWS RR 0  MEWS LOC 0  MEWS Score 2  MEWS Score Color Yellow  Assess: if the MEWS score is Yellow or Red  Were vital signs taken at a resting state? Yes  Focused Assessment Documented focused assessment  Early Detection of Sepsis Score *See Row Information* Low  MEWS guidelines implemented *See Row Information* Yes  Treat  MEWS Interventions Administered prn meds/treatments  Take Vital Signs  Increase Vital Sign Frequency  Yellow: Q 2hr X 2 then Q 4hr X 2, if remains yellow, continue Q 4hrs  Escalate  MEWS: Escalate Yellow: discuss with charge nurse/RN and consider discussing with provider and RRT  Notify: Charge Nurse/RN  Name of Charge Nurse/RN Notified Alona Bene  Date Charge Nurse/RN Notified 05/06/20  Time Charge Nurse/RN Notified 2000  Notify: Provider  Provider Name/Title Blount  Date Provider Notified 05/06/20  Time Provider Notified 2004  Notification Type Page  Notification Reason Other (Comment) (yellow mews)  Pt became yellow MEWS at 1853hrs, v/s checked as above. On call MD notified and yellow MEWS protocol initiated. Prn hydralazine administered

## 2020-05-07 ENCOUNTER — Inpatient Hospital Stay (HOSPITAL_COMMUNITY): Payer: Medicare Other

## 2020-05-07 LAB — COMPREHENSIVE METABOLIC PANEL
ALT: 16 U/L (ref 0–44)
AST: 24 U/L (ref 15–41)
Albumin: 3.6 g/dL (ref 3.5–5.0)
Alkaline Phosphatase: 82 U/L (ref 38–126)
Anion gap: 13 (ref 5–15)
BUN: 27 mg/dL — ABNORMAL HIGH (ref 8–23)
CO2: 21 mmol/L — ABNORMAL LOW (ref 22–32)
Calcium: 9.1 mg/dL (ref 8.9–10.3)
Chloride: 109 mmol/L (ref 98–111)
Creatinine, Ser: 1.52 mg/dL — ABNORMAL HIGH (ref 0.44–1.00)
GFR calc Af Amer: 41 mL/min — ABNORMAL LOW (ref >60)
GFR calc non Af Amer: 35 mL/min — ABNORMAL LOW (ref >60)
Glucose, Bld: 113 mg/dL — ABNORMAL HIGH (ref 70–99)
Potassium: 4.4 mmol/L (ref 3.5–5.1)
Sodium: 143 mmol/L (ref 135–145)
Total Bilirubin: 0.9 mg/dL (ref 0.3–1.2)
Total Protein: 6.6 g/dL (ref 6.5–8.1)

## 2020-05-07 LAB — CBC
HCT: 35.1 % — ABNORMAL LOW (ref 36.0–46.0)
Hemoglobin: 11.5 g/dL — ABNORMAL LOW (ref 12.0–15.0)
MCH: 31 pg (ref 26.0–34.0)
MCHC: 32.8 g/dL (ref 30.0–36.0)
MCV: 94.6 fL (ref 80.0–100.0)
Platelets: 251 10*3/uL (ref 150–400)
RBC: 3.71 MIL/uL — ABNORMAL LOW (ref 3.87–5.11)
RDW: 13.9 % (ref 11.5–15.5)
WBC: 13.6 10*3/uL — ABNORMAL HIGH (ref 4.0–10.5)
nRBC: 0 % (ref 0.0–0.2)

## 2020-05-07 LAB — PROTIME-INR
INR: 1.2 (ref 0.8–1.2)
Prothrombin Time: 14.2 seconds (ref 11.4–15.2)

## 2020-05-07 LAB — SEDIMENTATION RATE: Sed Rate: 9 mm/hr (ref 0–22)

## 2020-05-07 LAB — GLUCOSE, CAPILLARY: Glucose-Capillary: 102 mg/dL — ABNORMAL HIGH (ref 70–99)

## 2020-05-07 LAB — LACTIC ACID, PLASMA: Lactic Acid, Venous: 2.3 mmol/L (ref 0.5–1.9)

## 2020-05-07 MED ORDER — HEPARIN SODIUM (PORCINE) 5000 UNIT/ML IJ SOLN
5000.0000 [IU] | Freq: Three times a day (TID) | INTRAMUSCULAR | Status: DC
  Administered 2020-05-07 – 2020-05-11 (×13): 5000 [IU] via SUBCUTANEOUS
  Filled 2020-05-07 (×13): qty 1

## 2020-05-07 MED ORDER — METOPROLOL TARTRATE 5 MG/5ML IV SOLN
10.0000 mg | Freq: Three times a day (TID) | INTRAVENOUS | Status: DC | PRN
  Administered 2020-05-07 – 2020-05-10 (×4): 10 mg via INTRAVENOUS
  Filled 2020-05-07 (×4): qty 10

## 2020-05-07 MED ORDER — HYDROCORTISONE NA SUCCINATE PF 100 MG IJ SOLR
50.0000 mg | Freq: Three times a day (TID) | INTRAMUSCULAR | Status: DC
  Administered 2020-05-07 – 2020-05-09 (×7): 50 mg via INTRAVENOUS
  Filled 2020-05-07 (×7): qty 2

## 2020-05-07 MED ORDER — FENTANYL CITRATE (PF) 100 MCG/2ML IJ SOLN
INTRAMUSCULAR | Status: AC
  Administered 2020-05-07: 100 ug
  Filled 2020-05-07: qty 2

## 2020-05-07 MED ORDER — SODIUM CHLORIDE 0.9 % IV SOLN: INTRAVENOUS | Status: DC

## 2020-05-07 MED ORDER — SODIUM CHLORIDE 0.9 % IV SOLN: INTRAVENOUS | Status: AC

## 2020-05-07 MED ORDER — SODIUM CHLORIDE 0.9 % IV SOLN
2.0000 g | INTRAVENOUS | Status: DC
  Administered 2020-05-07 – 2020-05-11 (×5): 2 g via INTRAVENOUS
  Filled 2020-05-07 (×2): qty 20
  Filled 2020-05-07: qty 2
  Filled 2020-05-07: qty 20
  Filled 2020-05-07: qty 2

## 2020-05-07 MED ORDER — FENTANYL CITRATE (PF) 100 MCG/2ML IJ SOLN
25.0000 ug | Freq: Once | INTRAMUSCULAR | Status: AC
  Administered 2020-05-07: 25 ug via INTRAVENOUS

## 2020-05-07 MED ORDER — METRONIDAZOLE IN NACL 5-0.79 MG/ML-% IV SOLN
500.0000 mg | Freq: Three times a day (TID) | INTRAVENOUS | Status: DC
  Administered 2020-05-07 – 2020-05-11 (×13): 500 mg via INTRAVENOUS
  Filled 2020-05-07 (×13): qty 100

## 2020-05-07 NOTE — Evaluation (Signed)
Occupational Therapy Evaluation Patient Details Name: Michelle Graves MRN: [id number] DOB: 1951/08/10 Today's Date: 05/07/2020    History of Present Illness 69 yo admitted with emesis and constipation with bowel obstruction. PMHx: RA, HTN, CKD, depression, colitis, CVA   Clinical Impression   Pt PTA: Pt living home alone and reports independence with ADL; IADLs provided for her as needed. Pt currently, limited by changes in cognition, decreased ability to care for self, decreased strength and decreased activity tolerance. Pt set-upA to modA overall for ADL. Pt ambulatory taking a few steps to recliner with minA overall. VSS on RA. Pt would benefit from continued OT skilled services for ADL, mobility and safety. OT following acutely.      Follow Up Recommendations  Home health OT;Supervision/Assistance - 24 hour    Equipment Recommendations  None recommended by OT    Recommendations for Other Services       Precautions / Restrictions Precautions Precautions: Fall;Other (comment) Precaution Comments: NGT Restrictions Weight Bearing Restrictions: No      Mobility Bed Mobility Overal bed mobility: Needs Assistance Bed Mobility: Supine to Sit     Supine to sit: Min guard     General bed mobility comments: use of rail and cues for technique  Transfers Overall transfer level: Needs assistance   Transfers: Sit to/from Stand;Stand Pivot Transfers Sit to Stand: Min assist Stand pivot transfers: Min guard       General transfer comment: minA for power up from bed    Balance Overall balance assessment: Needs assistance;History of Falls   Sitting balance-[name]: Fair     Standing balance support: Bilateral upper extremity supported Standing balance-[name]: Poor Standing balance comment: using RW for support                           ADL either performed or assessed with clinical judgement   ADL Overall ADL's : Needs  assistance/impaired Eating/Feeding: Set up;Sitting   Grooming: Set up;Sitting   Upper Body Bathing: Minimal assistance;Sitting;Standing   Lower Body Bathing: Minimal assistance;Cueing for safety;Sitting/lateral leans;Sit to/from stand   Upper Body Dressing : Minimal assistance;Sitting;Standing   Lower Body Dressing: Minimal assistance;Sitting/lateral leans;Sit to/from stand   Toilet Transfer: Minimal assistance;Stand-pivot;Cueing for safety;RW   Toileting- Clothing Manipulation and Hygiene: Moderate assistance;Cueing for safety;Sitting/lateral lean;Sit to/from stand       Functional mobility during ADLs: Minimal assistance;Rolling walker;Cueing for safety General ADL Comments: Pt limited by changes in cognition, decreased ability to care for self, decreased strength and decreased activity tolerance.      Vision Baseline Vision/History: No visual deficits Patient Visual Report: No change from baseline Vision Assessment?: No apparent visual deficits     Perception     Praxis      Pertinent Vitals/Pain Pain Assessment: 0-10 Pain Score: 8  Pain Location: left abdomen Pain Descriptors / Indicators: Aching;Discomfort Pain Intervention(s): Monitored during session     Hand Dominance Left   Extremity/Trunk Assessment Upper Extremity Assessment Upper Extremity Assessment: Generalized weakness   Lower Extremity Assessment Lower Extremity Assessment: Generalized weakness   Cervical / Trunk Assessment Cervical / Trunk Assessment: Kyphotic   Communication Communication Communication: No difficulties   Cognition Arousal/Alertness: Awake/alert Behavior During Therapy: WFL for tasks assessed/performed Overall Cognitive Status: Impaired/Different from baseline Area of Impairment: Problem solving;Memory                     Memory: Decreased short-term memory  Problem Solving: Slow processing General Comments: Pt appeared more alert since PT session, but pt  still disoriented to month and need for NGT.   General Comments  VSS. Pt with nausea - RN aware.    Exercises     Shoulder Instructions      Home Living Family/patient expects to be discharged to:: Private residence Living Arrangements: Alone Available Help at Discharge: Family;Available PRN/intermittently Type of Home: Apartment Home Access: Level entry;Elevator     Home Layout: One level     Bathroom Shower/Tub: Chief Strategy Officer: Standard Bathroom Accessibility: Yes   Home Equipment: Walker - 2 wheels;Walker - 4 wheels;Cane - quad;Shower seat;Bedside commode;Wheelchair - manual   Additional Comments: Patients daughter lives around the corner       Prior Functioning/Environment Level of Independence: Needs assistance  Gait / Transfers Assistance Needed: RW for gait, daughter assists with stairs at her house with railing ADL's / Homemaking Assistance Needed: daughter assists with medication, cooking, cleaning. pt bathes and dresses on her own            OT Problem List: Decreased strength;Decreased activity tolerance;Impaired balance (sitting and/or standing);Decreased coordination;Impaired UE functional use;Pain;Increased edema      OT Treatment/Interventions: Self-care/ADL training;Therapeutic exercise;Energy conservation;DME and/or AE instruction;Therapeutic activities;Cognitive remediation/compensation;Patient/family education;Balance training    OT Goals(Current goals can be found in the care plan section) Acute Rehab OT Goals Patient Stated Goal: return home with my daughter, feel better OT Goal Formulation: With patient Time For Goal Achievement: 05/21/20 Potential to Achieve Goals: Good ADL Goals Pt Will Perform Grooming: with set-up;standing Pt Will Perform Lower Body Dressing: with supervision;sitting/lateral leans;sit to/from stand Pt Will Transfer to Toilet: with supervision;ambulating Additional ADL Goal #1: Pt will increase to  supervisionA for OOB ADL tasks to increase independence. Additional ADL Goal #2: Pt will follow multi-step commands with no verbal cues to complete higher level cognitive tasks.  OT Frequency: Min 2X/week   Barriers to D/C:            Co-evaluation              AM-PAC OT "6 Clicks" Daily Activity     Outcome Measure Help from another person eating meals?: A Little Help from another person taking care of personal grooming?: A Little Help from another person toileting, which includes using toliet, bedpan, or urinal?: A Little Help from another person bathing (including washing, rinsing, drying)?: A Little Help from another person to put on and taking off regular upper body clothing?: A Little Help from another person to put on and taking off regular lower body clothing?: A Little 6 Click Score: 18   End of Session Equipment Utilized During Treatment: Gait belt;Rolling walker Nurse Communication: Mobility status  Activity Tolerance: Patient limited by pain Patient left: in chair;with call bell/phone within reach;with chair alarm set;with nursing/sitter in room  OT Visit Diagnosis: Unsteadiness on feet (R26.81);Muscle weakness (generalized) (M62.81);Pain;Other symptoms and signs involving cognitive function Pain - part of body:  (NGT)                Time: 5188-4166 OT Time Calculation (min): 15 min Charges:  OT General Charges $OT Visit: 1 Visit OT Evaluation $OT Eval Moderate Complexity: 1 Mod  Flora Lipps, OTR/L Acute Rehabilitation Services Pager: [phone] Office: [phone]   Tyna Huertas  C 05/07/2020, 6:07 PM

## 2020-05-07 NOTE — Consult Note (Signed)
Referring Provider:  [facility] Primary Care Physician:  [facility] Primary Gastroenterologist:  Dr. Levora Angel  Reason for Consultation: Colonic obstruction  HPI: Michelle Graves is a 69 y.o. female with past medical history of CVA and chronic kidney disease presented to the hospital with vomiting and constipation.  CT abdomen pelvis without contrast showed marked distention of the colon from cecum to splenic flexure with abrupt transition point beyond splenic flexure concerning for colonic obstruction with also some concern for pneumatosis involving cecum and ascending colon.  Patient started noticing constipation around 15 days ago.  No bowel movement since last 15 days.  Not passing gas.  Started having dark-colored vomiting around 2 days ago.  Also complaining of generalized abdominal discomfort with more pain over the right lower quadrant.  Denies any fever.  Denies seeing any blood in the stool prior to this episode of constipation.  Previous GI work-up/history  Patient with history of ischemic colitis dated back to June 2020, history of CMV colitis diagnosed at [facility], (flexible sigmoidoscopy at [facility] in July 2020 which showed ischemic/inflammatory changes)  was admitted to the hospital again in December 2020 with abdominal pain and blood in the stool.  Colonoscopy on 10/05/2019 showed poor prep and inflammation in the distal transverse colon.  Biopsy revealed ulcerated granulation tissue.  Negative for viral etiology.  Both colonoscopy were incomplete.   EGD in December 2020 showed antral erosions and small hiatal hernia.  No evidence of ulcer disease.    Past Medical History:  Diagnosis Date  . Acute on chronic kidney failure (HCC)   . Anemia   . Arthritis   . Chest pain   . Diverticulitis   . Hypertension   . Stroke (HCC)   . UTI (urinary tract infection) 10/2019    Past Surgical History:  Procedure Laterality Date  . BIOPSY  04/01/2019   Procedure:  BIOPSY;  Surgeon: Kathi Der, MD;  Location: [facility] ENDOSCOPY;  Service: Gastroenterology;;  . BIOPSY  10/05/2019   Procedure: BIOPSY;  Surgeon: Kathi Der, MD;  Location: [facility] ENDOSCOPY;  Service: Gastroenterology;;  . COLONOSCOPY WITH PROPOFOL N/A 04/01/2019   Procedure: COLONOSCOPY WITH PROPOFOL;  Surgeon: Kathi Der, MD;  Location: [facility] ENDOSCOPY;  Service: Gastroenterology;  Laterality: N/A;  . COLONOSCOPY WITH PROPOFOL N/A 10/05/2019   Procedure: COLONOSCOPY WITH PROPOFOL;  Surgeon: Kathi Der, MD;  Location: [facility] ENDOSCOPY;  Service: Gastroenterology;  Laterality: N/A;  . ESOPHAGOGASTRODUODENOSCOPY (EGD) WITH PROPOFOL N/A 04/01/2019   Procedure: ESOPHAGOGASTRODUODENOSCOPY (EGD) WITH PROPOFOL;  Surgeon: Kathi Der, MD;  Location: [facility] ENDOSCOPY;  Service: Gastroenterology;  Laterality: N/A;  . ESOPHAGOGASTRODUODENOSCOPY (EGD) WITH PROPOFOL N/A 10/05/2019   Procedure: ESOPHAGOGASTRODUODENOSCOPY (EGD) WITH PROPOFOL;  Surgeon: Kathi Der, MD;  Location: [facility] ENDOSCOPY;  Service: Gastroenterology;  Laterality: N/A;  . TONSILLECTOMY      Prior to Admission medications   Medication Sig Start Date End Date Taking? Authorizing Provider  amLODipine (NORVASC) 10 MG tablet Take 10 mg by mouth daily. 08/19/19  Yes [provider]  doxepin (SINEQUAN) 50 MG capsule Take 10 mg by mouth daily. 05/02/20  Yes [provider]  DULoxetine (CYMBALTA) 60 MG capsule Take 60 mg by mouth at bedtime.    Yes [provider]  feeding supplement, ENSURE ENLIVE, (ENSURE ENLIVE) LIQD Take 237 mLs by mouth 3 (three) times daily between meals. 06/02/19  Yes Shahmehdi, Gemma Payor, MD  HYDROcodone-acetaminophen (NORCO/VICODIN) 5-325 MG tablet Take 1 tablet by mouth 3 (three) times daily as needed for moderate  pain.  10/14/19  Yes [provider]  hydroxychloroquine (PLAQUENIL) 200 MG tablet Take 200 mg by mouth 2 (two) times daily.  06/16/16  Yes [provider]   lisinopril (ZESTRIL) 40 MG tablet Take 40 mg by mouth daily. 05/02/20  Yes [provider]  Multiple Vitamin (MULTIVITAMIN WITH MINERALS) TABS tablet Take 1 tablet by mouth daily. 06/03/19  Yes Shahmehdi, Seyed A, MD  MYRBETRIQ 25 MG TB24 tablet Take 25 mg by mouth daily. 10/18/19  Yes [provider]  omeprazole (PRILOSEC) 20 MG capsule Take 20 mg by mouth daily as needed (acid reflux).  08/22/19  Yes [provider]  ondansetron (ZOFRAN) 4 MG tablet Take 1 tablet (4 mg total) by mouth every 6 (six) hours as needed for nausea. Patient taking differently: Take 4 mg by mouth every 6 (six) hours as needed for nausea or vomiting.  04/04/19  Yes Glade Lloyd, MD  prazosin (MINIPRESS) 1 MG capsule Take 1 mg by mouth at bedtime. 05/02/20  Yes [provider]  predniSONE (DELTASONE) 10 MG tablet Take 5 mg by mouth daily. 05/02/20  Yes [provider]  QUEtiapine (SEROQUEL) 25 MG tablet Take 25 mg by mouth daily. 05/02/20  Yes [provider]  solifenacin (VESICARE) 5 MG tablet Take 5 mg by mouth daily. 05/02/20  Yes [provider]  aspirin EC 81 MG EC tablet Take 1 tablet (81 mg total) by mouth daily. Patient not taking: Reported on 05/06/2020 05/16/19   Verdene Lennert, MD  chlorproMAZINE (THORAZINE) 25 MG tablet Take 1 tablet (25 mg total) by mouth 3 (three) times daily as needed for hiccoughs. Patient not taking: Reported on 05/06/2020 11/12/19   Elpidio Anis, PA-C  clopidogrel (PLAVIX) 75 MG tablet Take 1 tablet (75 mg total) by mouth daily. Patient not taking: Reported on 05/06/2020 04/05/19   Glade Lloyd, MD  colestipol (COLESTID) 1 g tablet Take 1 tablet (1 g total) by mouth 2 (two) times daily. Patient not taking: Reported on 05/06/2020 05/16/19   Verdene Lennert, MD  docusate sodium (COLACE) 100 MG capsule Take 1 capsule (100 mg total) by mouth 2 (two) times daily. Patient not taking: Reported on 05/06/2020 06/02/19   Kendell Bane, MD   megestrol (MEGACE) 400 MG/10ML suspension Take 10 mLs (400 mg total) by mouth 2 (two) times daily. Patient not taking: Reported on 05/06/2020 06/02/19   Kendell Bane, MD  tiZANidine (ZANAFLEX) 4 MG tablet Take 4 mg by mouth 2 (two) times daily. Patient not taking: Reported on 05/06/2020 08/22/19   [provider]    Scheduled Meds: . hydrocortisone sod succinate (SOLU-CORTEF) inj  50 mg Intravenous Q8H  . pantoprazole (PROTONIX) IV  40 mg Intravenous Q12H   Continuous Infusions: . sodium chloride 75 mL/hr at 05/07/20 0738   PRN Meds:.acetaminophen **OR** acetaminophen, albuterol, hydrALAZINE, LORazepam, morphine injection, [DISCONTINUED] ondansetron **OR** ondansetron (ZOFRAN) IV  Allergies as of 05/06/2020  . (No Known Allergies)    Family History  Problem Relation Age of Onset  . Hypertension Mother   . Diabetes Mother   . CAD Father        died of MI at age 6  . Hypertension Father   . Diabetes Sister   . Diabetes Sister   . Kidney disease Neg Hx     Social History   Socioeconomic History  . Marital status: Divorced    Spouse name: Not on file  . Number of children: Not on file  . Years of  education: Not on file  . Highest education level: Not on file  Occupational History  . Not on file  Tobacco Use  . Smoking status: Never Smoker  . Smokeless tobacco: Never Used  Vaping Use  . Vaping Use: Never used  Substance and Sexual Activity  . Alcohol use: Yes    Alcohol/week: 21.0 standard drinks    Types: 21 Glasses of wine per week    Comment: sometimes   . Drug use: No  . Sexual activity: Yes    Partners: Male    Birth control/protection: Condom  Other Topics Concern  . Not on file  Social History Narrative  . Not on file   Social Determinants of Health   Financial Resource Strain:   . Difficulty of Paying Living Expenses:   Food Insecurity:   . Worried About Programme researcher, broadcasting/film/video in the Last Year:   . Barista in the Last Year:    Transportation Needs:   . Freight forwarder (Medical):   Marland Kitchen Lack of Transportation (Non-Medical):   Physical Activity:   . Days of Exercise per Week:   . Minutes of Exercise per Session:   Stress:   . Feeling of Stress :   Social Connections:   . Frequency of Communication with Friends and Family:   . Frequency of Social Gatherings with Friends and Family:   . Attends Religious Services:   . Active Member of Clubs or Organizations:   . Attends Banker Meetings:   Marland Kitchen Marital Status:   Intimate Partner Violence:   . Fear of Current or Ex-Partner:   . Emotionally Abused:   Marland Kitchen Physically Abused:   . Sexually Abused:     Review of Systems: All negative except as stated above in HPI.  Physical Exam: Vital signs: Vitals:   05/07/20 0408 05/07/20 0752  BP: 109/75 (!) 121/101  Pulse: (!) 143 (!) 142  Resp:  15  Temp: 98.5 F (36.9 C) 99.2 F (37.3 C)  SpO2: 100% 98%   Last BM Date: 04/27/20 Physical Exam Constitutional:      General: She is not in acute distress.    Comments: Resting comfortably in the bed.  HENT:     Head: Normocephalic and atraumatic.     Nose:     Comments: NG tube present Eyes:     Extraocular Movements: Extraocular movements intact.  Cardiovascular:     Rate and Rhythm: Normal rate and regular rhythm.     Heart sounds: No murmur heard.   Pulmonary:     Effort: Pulmonary effort is normal. No respiratory distress.  Abdominal:     General: Bowel sounds are normal.     Palpations: Abdomen is soft.     Comments: Generalized abdominal discomfort with right lower quadrant tenderness to palpation, bowel sounds are present.  No peritoneal signs  Musculoskeletal:        General: No swelling or tenderness.  Skin:    General: Skin is warm.     Coloration: Skin is not jaundiced.  Neurological:     Mental Status: She is alert and oriented to person, place, and time.  Psychiatric:        Mood and Affect: Mood normal.        Judgment:  Judgment normal.     GI:  Lab Results: Recent Labs    05/06/20 0814 05/06/20 1624 05/07/20 0514  WBC 14.9*  --  13.6*  HGB 12.3 11.5* 11.5*  HCT  37.7 35.8* 35.1*  PLT 257  --  251   BMET Recent Labs    05/06/20 0814 05/07/20 0514  NA 138 143  K 3.8 4.4  CL 103 109  CO2 23 21*  GLUCOSE 165* 113*  BUN 32* 27*  CREATININE 1.74* 1.52*  CALCIUM 9.8 9.1   LFT Recent Labs    05/07/20 0514  PROT 6.6  ALBUMIN 3.6  AST 24  ALT 16  ALKPHOS 82  BILITOT 0.9   PT/INR No results for input(s): LABPROT, INR in the last 72 hours.   Studies/Results: CT Abdomen Pelvis Wo Contrast  Result Date: 05/06/2020 CLINICAL DATA:  Abdominal distension.  Nausea and vomiting. EXAM: CT ABDOMEN AND PELVIS WITHOUT CONTRAST TECHNIQUE: Multidetector CT imaging of the abdomen and pelvis was performed following the standard protocol without IV contrast. COMPARISON:  Mar 16, 2020 FINDINGS: Lower chest: No acute abnormality. Hepatobiliary: No focal liver abnormality is seen. No gallstones, gallbladder wall thickening, or biliary dilatation. Pancreas: Unremarkable. No pancreatic ductal dilatation or surrounding inflammatory changes. Spleen: Normal in size without focal abnormality. Adrenals/Urinary Tract: Adrenal glands are unremarkable. Kidneys are normal, without renal calculi, focal lesion, or hydronephrosis. Bladder is unremarkable. Stomach/Bowel: The stomach and small bowel are normal. The descending colon and sigmoid colon are relatively decompressed. There are scattered diverticuli but no diverticulitis. The colon is markedly distended with stool from just beyond the splenic flexure through the level of the cecum. The cecum measures up to 9 cm in diameter. There is gas outlining the stool in the distal cecum and proximal ascending colon. This gas extends non dependently and is different in appearance compared to the remainder of the colon. No definite wall thickening. No venous gas. The appendix is  normal. Vascular/Lymphatic: No significant vascular findings are present. No enlarged abdominal or pelvic lymph nodes. Reproductive: Uterus and bilateral adnexa are unremarkable. Other: No free air or free fluid. Musculoskeletal: A compression fracture of L5 with concavity of the superior endplate was also present in May of 2021 but may be slightly worsened in the interval. No other acute bony abnormalities. IMPRESSION: 1. Marked distension of the colon from the level of the cecum through the splenic flexure with an abrupt transition point just beyond the splenic flexure. Beyond the transition point, the colon is decompressed. The findings are concerning for a colonic obstruction although an underlying cause is not seen on this study. Recommend colonoscopy to better evaluate the region of transition point. 2. The pattern of gas outlining the stool in the cecum and ascending colon extending dependently raises the possibility of pneumatosis. No definite wall thickening or adjacent fat stranding. No free air. 3. A compression fracture at L5 was present in May of 2021 but appears to have mildly worsened. Findings were called to Dr. Charm Barges. Electronically Signed   By: Gerome Sam III M.D   On: 05/06/2020 13:07   DG Abd Portable 1V-Small Bowel Obstruction Protocol-initial, 8 hr delay  Result Date: 05/07/2020 CLINICAL DATA:  69 year old female with small bowel obstruction. EXAM: PORTABLE ABDOMEN - 1 VIEW COMPARISON:  Abdominal radiograph dated 05/06/2020. FINDINGS: Partially visualized enteric tube with tip and side-port in the left upper abdomen likely in the proximal stomach. Partially visualized contrast in the stomach. Faint linear high density over the right hemiabdomen, possibly external to the patient and contamination. Air is noted within the colon. No definite bowel dilatation. The soft tissues and osseous structures are grossly unremarkable. IMPRESSION: Enteric tube with tip and side-port in the proximal  stomach. No significant interval change. Electronically Signed   By: Elgie Collard M.D.   On: 05/07/2020 02:34   DG Abd Portable 1V-Small Bowel Protocol-Position Verification  Result Date: 05/06/2020 CLINICAL DATA:  Enteric catheter placement EXAM: PORTABLE ABDOMEN - 1 VIEW COMPARISON:  05/06/2020 FINDINGS: Frontal view of the lower chest and upper abdomen excludes the right hemidiaphragm and lower pelvis by collimation. Tip and side port of an enteric catheter project over the gastric body. Large amount of stool within the proximal colon again noted. IMPRESSION: 1. Enteric catheter overlying gastric body. Electronically Signed   By: Sharlet Salina M.D.   On: 05/06/2020 15:13    Impression/Plan: -Abdominal pain with constipation.  No bowel movement for last 15 days.  CT scan showed colonic obstruction with transition point at splenic flexure.  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 Patient had 2 attempted colonoscopy here at [facility] and 1 flex sig at [facility] in last 1 year.  It showed ischemic/inflammatory changes in the descending and transverse colon.  -Coffee-ground emesis.  -CT scan concerning for possible pneumatosis on the right colon.  Recommendations ---------------------------- -Discussed with Dr. Thedore Mins and Dr. Matthias Hughs.  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  Repeat colonoscopy would not change her management and it may lead to air trapping and worsening distention of the right colon and will increase the risk of perforation.  -Consider barium enema to document severity of stricture.  Discussed with surgical PA.  They would like to hold off on barium enema for now.  -Start Flagyl  -GI will follow    LOS: 1 day   Kathi Der  MD, FACP 05/07/2020, 8:53 AM  Contact #  [phone]

## 2020-05-07 NOTE — Progress Notes (Signed)
PROGRESS NOTE                                                                                                                                                                                                             Patient Demographics:    Michelle Graves, is a 69 y.o. female, DOB - 11-29-1950, QVZ:[id number]  Admit date - 05/06/2020   Admitting Physician Clydie Braun, MD  Outpatient Primary MD for the patient is [facility]  LOS - 1  Chief Complaint  Patient presents with  . Emesis  . Constipation       Brief Narrative Michelle Graves is a 69 y.o. female with medical history significant of hypertension, chronic kidney disease stage IIIb, diverticulitis, and CVA presents with complaints of vomiting black stool since yesterday evening.  Work-up in the ER suggested colonic obstruction.  General surgery and GI were consulted for colonic obstruction and she was admitted to hospitalist service for management of nausea and abdominal pain.    Subjective:    [name] today has, No headache, No chest pain, she has nausea with ongoing emesis around the NG tube, positive generalized abdominal discomfort, No new weakness tingling or numbness, No Cough - SOB.     Assessment  & Plan :     1. SIRS with Colonic obstruction causing nausea vomiting, likely due to chronic inflammation due to diverticulitis versus ischemia in that region.  She has NG tube, still appears to be in quite a bit of distress, n.p.o., IV fluids, supportive care, have discussed the case personally with general surgery team and GI physician.  Added Rocephin and IV Flagyl for the possibility of pneumatosis, defer further management to GI and general surgery.  2.  History of CVA.  Currently strictly n.p.o., medications held risks and benefits explained to daughter.  3.  Hypertension and tachycardia.  Due to abdominal discomfort, symptom control, as needed IV Lopressor and  hydralazine ordered.  4.  CKD stage IIIb.  Baseline creatinine 1.75.  Hydrate and monitor.  5. GERD.  IV PPI.    Condition - Extremely Guarded  Family Communication  : Daughter [name]  [phone] over the phone on 05/07/2020  Code Status :  Full  Consults  :  GI, CCS  Procedures  :    CT -  1. Marked distension of the colon from the level of the cecum through the splenic flexure with an abrupt transition point just beyond the splenic flexure.  Beyond the transition point, the colon is decompressed. The findings are concerning for a colonic obstruction although an underlying cause is not seen on this study. Recommend colonoscopy to better evaluate the region of transition point. 2. The pattern of gas outlining the stool in the cecum and ascending colon extending dependently raises the possibility of pneumatosis. No definite wall thickening or adjacent fat stranding. No free air. 3. A compression fracture at L5 was present in May of 2021 but appears to have mildly worsened.   PUD Prophylaxis : PPI  Disposition Plan  :    Status is: Inpatient  Remains inpatient appropriate because:IV treatments appropriate due to intensity of illness or inability to take PO   Dispo: The patient is from: Home              Anticipated d/c is to: Home              Anticipated d/c date is: > 3 days              Patient currently is not medically stable to d/c.  DVT Prophylaxis  :   Heparin   Lab Results  Component Value Date   PLT 251 05/07/2020    Diet :  Diet Order            Diet NPO time specified  Diet effective now                  Inpatient Medications Scheduled Meds: . hydrocortisone sod succinate (SOLU-CORTEF) inj  50 mg Intravenous Q8H  . pantoprazole (PROTONIX) IV  40 mg Intravenous Q12H   Continuous Infusions: . sodium chloride     PRN Meds:.acetaminophen **OR** acetaminophen, albuterol, hydrALAZINE, LORazepam, metoprolol tartrate, morphine injection, [DISCONTINUED]  ondansetron **OR** ondansetron (ZOFRAN) IV  Antibiotics  :   Anti-infectives (From admission, onward)   None        Objective:   Vitals:   05/06/20 2156 05/06/20 2359 05/07/20 0408 05/07/20 0752  BP: (!) 136/93 (!) 149/123 109/75 (!) 121/101  Pulse: (!) 135 (!) 138 (!) 143 (!) 142  Resp: Temp: 99.7 F (37.6 C) 99 F (37.2 C) 98.5 F (36.9 C) 99.2 F (37.3 C)  TempSrc:    Oral  SpO2: 96% 98% 100% 98%  Weight:      Height:        SpO2: 98 % O2 Flow Rate (L/min): 0 L/min (pt on room air) FiO2 (%): 21 %  Wt Readings from Last 3 Encounters:  05/06/20 77.1 kg  03/16/20 83 kg  11/09/19 80.8 kg     Intake/Output Summary (Last 24 hours) at 05/07/2020 0926 Last data filed at 05/06/2020 1455 Gross per 24 hour  Intake 1000 ml  Output --  Net 1000 ml     Physical Exam  Awake Alert, No new F.N deficits, Normal affect [place].AT,PERRAL Supple Neck,No JVD, No cervical lymphadenopathy appriciated.  Symmetrical Chest wall movement, Good air movement bilaterally, CTAB RRR,No Gallops,Rubs or new Murmurs, No Parasternal Heave +ve B.Sounds, Abd Soft, No tenderness, No organomegaly appriciated, No rebound - guarding or rigidity. No Cyanosis, Clubbing or edema, No new Rash or bruise     Data Review:    Recent Labs  Lab 05/06/20 0814 05/06/20 1624 05/07/20 0514  WBC 14.9*  --  13.6*  HGB 12.3 11.5* 11.5*  HCT 37.7 35.8* 35.1*  PLT 257  --  251  MCV 92.4  --  94.6  MCH 30.1  --  31.0  MCHC 32.6  --  32.8  RDW 13.3  --  13.9    Recent Labs  Lab 05/06/20 0814 05/06/20 1624 05/07/20 0514  NA 138  --  143  K 3.8  --  4.4  CL 103  --  109  CO2 23  --  21*  GLUCOSE 165*  --  113*  BUN 32*  --  27*  CREATININE 1.74*  --  1.52*  CALCIUM 9.8  --  9.1  AST 19  --  24  ALT 15  --  16  ALKPHOS 107  --  82  BILITOT 0.5  --  0.9  ALBUMIN 4.0  --  3.6  CRP  --  0.6  --     Recent Labs  Lab 05/06/20 1341 05/06/20 1624  CRP  --  0.6  SARSCOV2NAA NEGATIVE   --     ------------------------------------------------------------------------------------------------------------------ No results for input(s): CHOL, HDL, LDLCALC, TRIG, CHOLHDL, LDLDIRECT in the last 72 hours.  Lab Results  Component Value Date   HGBA1C 6.2 (H) 04/01/2019   ------------------------------------------------------------------------------------------------------------------ No results for input(s): TSH, T4TOTAL, T3FREE, THYROIDAB in the last 72 hours.  Invalid input(s): FREET3 ------------------------------------------------------------------------------------------------------------------ No results for input(s): VITAMINB12, FOLATE, FERRITIN, TIBC, IRON, RETICCTPCT in the last 72 hours.  Coagulation profile No results for input(s): INR, PROTIME in the last 168 hours.  No results for input(s): DDIMER in the last 72 hours.  Cardiac Enzymes No results for input(s): CKMB, TROPONINI, MYOGLOBIN in the last 168 hours.  Invalid input(s): CK ------------------------------------------------------------------------------------------------------------------    Component Value Date/Time   BNP 133.9 (H) 04/22/2018 1820    Micro Results Recent Results (from the past 240 hour(s))  SARS Coronavirus 2 by RT PCR (hospital order, performed in [facility] hospital lab) Nasopharyngeal Nasopharyngeal Swab     Status: None   Collection Time: 05/06/20  1:41 PM   Specimen: Nasopharyngeal Swab  Result Value Ref Range Status   SARS Coronavirus 2 NEGATIVE NEGATIVE Final    Comment: (NOTE) SARS-CoV-2 target nucleic acids are NOT DETECTED.  The SARS-CoV-2 RNA is generally detectable in upper and lower respiratory specimens during the acute phase of infection. The lowest concentration of SARS-CoV-2 viral copies this assay can detect is 250 copies / mL. A negative result does not preclude SARS-CoV-2 infection and should not be used as the sole basis for treatment or other patient  management decisions.  A negative result may occur with improper specimen collection / handling, submission of specimen other than nasopharyngeal swab, presence of viral mutation(s) within the areas targeted by this assay, and inadequate number of viral copies (<250 copies / mL). A negative result must be combined with clinical observations, patient history, and epidemiological information.  Fact Sheet for Patients:   BoilerBrush.com.cy  Fact Sheet for Healthcare Providers: https://pope.com/  This test is not yet approved or  cleared by the [place] FDA and has been authorized for detection and/or diagnosis of SARS-CoV-2 by FDA under an Emergency Use Authorization (EUA).  This EUA will remain in effect (meaning this test can be used) for the duration of the COVID-19 declaration under Section 564(b)(1) of the Act, 21 U.S.C. section 360bbb-3(b)(1), unless the authorization is terminated or revoked sooner.  Performed at [facility], [address], [place], [place] [address]     Radiology Reports CT Abdomen Pelvis Wo Contrast  Result Date: 05/06/2020 CLINICAL DATA:  Abdominal distension.  Nausea and vomiting. EXAM: CT ABDOMEN AND PELVIS WITHOUT CONTRAST TECHNIQUE: Multidetector  CT imaging of the abdomen and pelvis was performed following the standard protocol without IV contrast. COMPARISON:  Mar 16, 2020 FINDINGS: Lower chest: No acute abnormality. Hepatobiliary: No focal liver abnormality is seen. No gallstones, gallbladder wall thickening, or biliary dilatation. Pancreas: Unremarkable. No pancreatic ductal dilatation or surrounding inflammatory changes. Spleen: Normal in size without focal abnormality. Adrenals/Urinary Tract: Adrenal glands are unremarkable. Kidneys are normal, without renal calculi, focal lesion, or hydronephrosis. Bladder is unremarkable. Stomach/Bowel: The stomach and small bowel are normal. The descending  colon and sigmoid colon are relatively decompressed. There are scattered diverticuli but no diverticulitis. The colon is markedly distended with stool from just beyond the splenic flexure through the level of the cecum. The cecum measures up to 9 cm in diameter. There is gas outlining the stool in the distal cecum and proximal ascending colon. This gas extends non dependently and is different in appearance compared to the remainder of the colon. No definite wall thickening. No venous gas. The appendix is normal. Vascular/Lymphatic: No significant vascular findings are present. No enlarged abdominal or pelvic lymph nodes. Reproductive: Uterus and bilateral adnexa are unremarkable. Other: No free air or free fluid. Musculoskeletal: A compression fracture of L5 with concavity of the superior endplate was also present in May of 2021 but may be slightly worsened in the interval. No other acute bony abnormalities. IMPRESSION: 1. Marked distension of the colon from the level of the cecum through the splenic flexure with an abrupt transition point just beyond the splenic flexure. Beyond the transition point, the colon is decompressed. The findings are concerning for a colonic obstruction although an underlying cause is not seen on this study. Recommend colonoscopy to better evaluate the region of transition point. 2. The pattern of gas outlining the stool in the cecum and ascending colon extending dependently raises the possibility of pneumatosis. No definite wall thickening or adjacent fat stranding. No free air. 3. A compression fracture at L5 was present in May of 2021 but appears to have mildly worsened. Findings were called to Dr. Charm Barges. Electronically Signed   By: Gerome Sam III M.D   On: 05/06/2020 13:07   DG Abd Portable 1V-Small Bowel Obstruction Protocol-initial, 8 hr delay  Result Date: 05/07/2020 CLINICAL DATA:  69 year old female with small bowel obstruction. EXAM: PORTABLE ABDOMEN - 1 VIEW  COMPARISON:  Abdominal radiograph dated 05/06/2020. FINDINGS: Partially visualized enteric tube with tip and side-port in the left upper abdomen likely in the proximal stomach. Partially visualized contrast in the stomach. Faint linear high density over the right hemiabdomen, possibly external to the patient and contamination. Air is noted within the colon. No definite bowel dilatation. The soft tissues and osseous structures are grossly unremarkable. IMPRESSION: Enteric tube with tip and side-port in the proximal stomach. No significant interval change. Electronically Signed   By: Elgie Collard M.D.   On: 05/07/2020 02:34   DG Abd Portable 1V-Small Bowel Protocol-Position Verification  Result Date: 05/06/2020 CLINICAL DATA:  Enteric catheter placement EXAM: PORTABLE ABDOMEN - 1 VIEW COMPARISON:  05/06/2020 FINDINGS: Frontal view of the lower chest and upper abdomen excludes the right hemidiaphragm and lower pelvis by collimation. Tip and side port of an enteric catheter project over the gastric body. Large amount of stool within the proximal colon again noted. IMPRESSION: 1. Enteric catheter overlying gastric body. Electronically Signed   By: Sharlet Salina M.D.   On: 05/06/2020 15:13     Time Spent in minutes  30   Susa Raring M.D on  05/07/2020 at 9:26 AM  To page go to [email or web address] - password [facility]

## 2020-05-07 NOTE — Progress Notes (Signed)
   05/07/20 0908  Assess: MEWS Score  Level of Consciousness Alert  Assess: MEWS Score  MEWS Temp 0  MEWS Systolic 0  MEWS Pulse 3  MEWS RR 0  MEWS LOC 0  MEWS Score 3  MEWS Score Color Yellow  Treat  MEWS Interventions Administered scheduled meds/treatments;Administered prn meds/treatments  Take Vital Signs  Increase Vital Sign Frequency  Yellow: Q 2hr X 2 then Q 4hr X 2, if remains yellow, continue Q 4hrs  Document  Patient Outcome Stabilized after interventions  Progress note created (see row info) Yes   Patient remains a yellow MEWS. MD consulted and PRN meds ordered. Will maintain q4H VS.

## 2020-05-07 NOTE — Progress Notes (Signed)
[facility] Progress Note     Subjective: Patient reports no relief after NGT placement and has been vomiting around NGT. Patient reports generalized abdominal pain with increased pain in RLQ. Patient denies flatus or BM. Prior to this she was having 1-3 BMs daily.   Objective: Vital signs in last 24 hours: Temp:  [98.5 F (36.9 C)-99.7 F (37.6 C)] 99.2 F (37.3 C) (07/12 0752) Pulse Rate:  [100-143] 142 (07/12 0752) Resp:  [15-20] 15 (07/12 0752) BP: (109-152)/(75-123) 121/101 (07/12 0752) SpO2:  [96 %-100 %] 98 % (07/12 0752) FiO2 (%):  [21 %] 21 % (07/11 1437) Last BM Date: 04/27/20  Intake/Output from previous day: 07/11 0701 - 07/12 0700 In: 1000 [IV Piggyback:1000] Out: -  Intake/Output this shift: No intake/output data recorded.  PE: General: pleasant, WD, WN female who is laying in bed in obvious discomfort HEENT: Sclera are noninjected.  PERRL.  Ears and nose without any masses or lesions.  Mouth is pink and moist Heart: sinus tachycardia.  Palpable radial and pedal pulses bilaterally Lungs: CTAB, no wheezes, rhonchi, or rales noted.  Respiratory effort nonlabored Abd: soft, generalized ttp with increased ttp in RLQ, mildly distended, BS hypoactive, NGT with minimal drainage and not pulling back easily MS: all 4 extremities are symmetrical with no cyanosis, clubbing, or edema. Skin: warm and dry with no masses, lesions, or rashes Neuro: Cranial nerves 2-12 grossly intact, sensation grossly intact throughout Psych: A&Ox3 with an appropriate affect.   Lab Results:  Recent Labs    05/06/20 0814 05/06/20 0814 05/06/20 1624 05/07/20 0514  WBC 14.9*  --   --  13.6*  HGB 12.3   < > 11.5* 11.5*  HCT 37.7   < > 35.8* 35.1*  PLT 257  --   --  251   < > = values in this interval not displayed.   BMET Recent Labs    05/06/20 0814 05/07/20 0514  NA 138 143  K 3.8 4.4  CL 103 109  CO2 23 21*  GLUCOSE 165* 113*  BUN 32* 27*  CREATININE 1.74*  1.52*  CALCIUM 9.8 9.1   PT/INR No results for input(s): LABPROT, INR in the last 72 hours. CMP     Component Value Date/Time   NA 143 05/07/2020 0514   K 4.4 05/07/2020 0514   CL 109 05/07/2020 0514   CO2 21 (L) 05/07/2020 0514   GLUCOSE 113 (H) 05/07/2020 0514   BUN 27 (H) 05/07/2020 0514   CREATININE 1.52 (H) 05/07/2020 0514   CALCIUM 9.1 05/07/2020 0514   PROT 6.6 05/07/2020 0514   ALBUMIN 3.6 05/07/2020 0514   AST 24 05/07/2020 0514   ALT 16 05/07/2020 0514   ALKPHOS 82 05/07/2020 0514   BILITOT 0.9 05/07/2020 0514   GFRNONAA 35 (L) 05/07/2020 0514   GFRAA 41 (L) 05/07/2020 0514   Lipase     Component Value Date/Time   LIPASE 26 05/06/2020 0814       Studies/Results: CT Abdomen Pelvis Wo Contrast  Result Date: 05/06/2020 CLINICAL DATA:  Abdominal distension.  Nausea and vomiting. EXAM: CT ABDOMEN AND PELVIS WITHOUT CONTRAST TECHNIQUE: Multidetector CT imaging of the abdomen and pelvis was performed following the standard protocol without IV contrast. COMPARISON:  Mar 16, 2020 FINDINGS: Lower chest: No acute abnormality. Hepatobiliary: No focal liver abnormality is seen. No gallstones, gallbladder wall thickening, or biliary dilatation. Pancreas: Unremarkable. No pancreatic ductal dilatation or surrounding inflammatory changes. Spleen: Normal in size without focal abnormality. Adrenals/Urinary Tract:  Adrenal glands are unremarkable. Kidneys are normal, without renal calculi, focal lesion, or hydronephrosis. Bladder is unremarkable. Stomach/Bowel: The stomach and small bowel are normal. The descending colon and sigmoid colon are relatively decompressed. There are scattered diverticuli but no diverticulitis. The colon is markedly distended with stool from just beyond the splenic flexure through the level of the cecum. The cecum measures up to 9 cm in diameter. There is gas outlining the stool in the distal cecum and proximal ascending colon. This gas extends non dependently and  is different in appearance compared to the remainder of the colon. No definite wall thickening. No venous gas. The appendix is normal. Vascular/Lymphatic: No significant vascular findings are present. No enlarged abdominal or pelvic lymph nodes. Reproductive: Uterus and bilateral adnexa are unremarkable. Other: No free air or free fluid. Musculoskeletal: A compression fracture of L5 with concavity of the superior endplate was also present in May of 2021 but may be slightly worsened in the interval. No other acute bony abnormalities. IMPRESSION: 1. Marked distension of the colon from the level of the cecum through the splenic flexure with an abrupt transition point just beyond the splenic flexure. Beyond the transition point, the colon is decompressed. The findings are concerning for a colonic obstruction although an underlying cause is not seen on this study. Recommend colonoscopy to better evaluate the region of transition point. 2. The pattern of gas outlining the stool in the cecum and ascending colon extending dependently raises the possibility of pneumatosis. No definite wall thickening or adjacent fat stranding. No free air. 3. A compression fracture at L5 was present in May of 2021 but appears to have mildly worsened. Findings were called to Dr. Charm Barges. Electronically Signed   By: Gerome Sam III M.D   On: 05/06/2020 13:07   DG Abd Portable 1V-Small Bowel Obstruction Protocol-initial, 8 hr delay  Result Date: 05/07/2020 CLINICAL DATA:  69 year old female with small bowel obstruction. EXAM: PORTABLE ABDOMEN - 1 VIEW COMPARISON:  Abdominal radiograph dated 05/06/2020. FINDINGS: Partially visualized enteric tube with tip and side-port in the left upper abdomen likely in the proximal stomach. Partially visualized contrast in the stomach. Faint linear high density over the right hemiabdomen, possibly external to the patient and contamination. Air is noted within the colon. No definite bowel dilatation. The  soft tissues and osseous structures are grossly unremarkable. IMPRESSION: Enteric tube with tip and side-port in the proximal stomach. No significant interval change. Electronically Signed   By: Elgie Collard M.D.   On: 05/07/2020 02:34   DG Abd Portable 1V-Small Bowel Protocol-Position Verification  Result Date: 05/06/2020 CLINICAL DATA:  Enteric catheter placement EXAM: PORTABLE ABDOMEN - 1 VIEW COMPARISON:  05/06/2020 FINDINGS: Frontal view of the lower chest and upper abdomen excludes the right hemidiaphragm and lower pelvis by collimation. Tip and side port of an enteric catheter project over the gastric body. Large amount of stool within the proximal colon again noted. IMPRESSION: 1. Enteric catheter overlying gastric body. Electronically Signed   By: Sharlet Salina M.D.   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 - patient was given small bowel protocol and 8h film shows no bowel dilatation, air in colon and NGT with in proximal stomach - pt having significant pain in RLQ this AM, some suggestion of possible pneumatosis on CT from yesterday,  lactic was 2.8 yesterday - pt also has significant stool burden noted in transverse and R colon - repeat KUB and lactic this AM - GI does not feel there is any benefit to repeating colonoscopy at this time given that she has had several in the last few months - most recent here was 09/2019 and biopsies did not show any malignancy  FEN: NPO, IVF, NGT to LIWS VTE: SCDs ID: no current abx  LOS: 1 day    Juliet Rude , [facility] 05/07/2020, 9:19 AM Please see Amion for pager number during day hours 7:00am-4:30pm

## 2020-05-07 NOTE — Evaluation (Signed)
Physical Therapy Evaluation Patient Details Name: Michelle Graves MRN: [id number] DOB: 13-Mar-1951 Today's Date: 05/07/2020   History of Present Illness  69 yo admitted with emesis and constipation with bowel obstruction. PMHx: RA, HTN, CKD, depression, colitis, CVA  Clinical Impression  Pt pleasant in bed on arrival with urine soaked linens and pt stating she had not made anyone aware but that she knew she needed to get changed. Pt with decreased cognition, strength, transfers, and gait who will benefit from acute therapy to maximize mobility, safety and function to decrease burden of care. Pt reports daughter can provide 24hr care but family not present to confirm. If family cannot provide care ST-SNF may be needed to prevent falls. Daughter currently fixes all meals and pt will microwave them.     Follow Up Recommendations Home health PT;Supervision/Assistance - 24 hour    Equipment Recommendations  None recommended by PT    Recommendations for Other Services OT consult     Precautions / Restrictions Precautions Precautions: Fall;Other (comment) Precaution Comments: NGT      Mobility  Bed Mobility Overal bed mobility: Needs Assistance Bed Mobility: Supine to Sit     Supine to sit: Min guard     General bed mobility comments: minguard with use of rail to pivot to left side of bed, guarding for lines and safety  Transfers Overall transfer level: Needs assistance   Transfers: Sit to/from Stand;Stand Pivot Transfers Sit to Stand: Min guard Stand pivot transfers: Min guard       General transfer comment: cues for hand placement to stand x 2 from bed and for pivot recliner <>BSC, guarding for lines  Ambulation/Gait Ambulation/Gait assistance: Min guard Gait Distance (Feet): 40 Feet Assistive device: Rolling walker (2 wheeled) Gait Pattern/deviations: Step-through pattern;Decreased stride length;Trunk flexed   Gait velocity interpretation: 1.31 - 2.62 ft/sec,  indicative of limited community ambulator General Gait Details: cues for proximity to RW and direction within room with pt maintaining flexed trunk. Pt needed walking toward toilet when going to wash hands and needed direction to sink  Stairs            Wheelchair Mobility    Modified Rankin (Stroke Patients Only)       Balance Overall balance assessment: Needs assistance;History of Falls   Sitting balance-[name]: Fair Sitting balance - Comments: pt able to sit EOB without support   Standing balance support: Bilateral upper extremity supported Standing balance-[name]: Poor Standing balance comment: single UE support during pivot, bil UE support with gait. Able to stand at sink with support of pelvis on counter                             Pertinent Vitals/Pain Pain Assessment: 0-10 Pain Score: 8  Pain Location: left abdomen Pain Descriptors / Indicators: Aching;Discomfort Pain Intervention(s): Limited activity within patient's tolerance;Monitored during session;Repositioned    Home Living Family/patient expects to be discharged to:: Private residence Living Arrangements: Alone Available Help at Discharge: Family;Available PRN/intermittently Type of Home: Apartment Home Access: Level entry;Elevator     Home Layout: One level Home Equipment: Walker - 2 wheels;Walker - 4 wheels;Cane - quad;Shower seat;Bedside commode;Wheelchair - manual      Prior Function Level of Independence: Needs assistance   Gait / Transfers Assistance Needed: RW for gait, daughter assists with stairs at her house with railing  ADL's / Homemaking Assistance Needed: daughter assists with medication, cooking, cleaning. pt bathes and dresses  on her own        Hand Dominance        Extremity/Trunk Assessment   Upper Extremity Assessment Upper Extremity Assessment: Generalized weakness    Lower Extremity Assessment Lower Extremity Assessment: Generalized weakness     Cervical / Trunk Assessment Cervical / Trunk Assessment: Kyphotic  Communication   Communication: No difficulties  Cognition Arousal/Alertness: Awake/alert Behavior During Therapy: WFL for tasks assessed/performed Overall Cognitive Status: Impaired/Different from baseline Area of Impairment: Problem solving;Memory                     Memory: Decreased short-term memory       Problem Solving: Slow processing General Comments: pt talking to daughter thinking she was in the room when she was not      General Comments      Exercises     Assessment/Plan    PT Assessment Patient needs continued PT services  PT Problem List Decreased strength;Decreased mobility;Decreased safety awareness;Decreased activity tolerance;Decreased cognition;Decreased balance;Decreased knowledge of use of DME       PT Treatment Interventions DME instruction;Therapeutic exercise;Gait training;Balance training;Functional mobility training;Therapeutic activities;Patient/family education;Cognitive remediation    PT Goals (Current goals can be found in the Care Plan section)  Acute Rehab PT Goals Patient Stated Goal: return home with my daughter, feel better PT Goal Formulation: With patient Time For Goal Achievement: 05/21/20 Potential to Achieve Goals: Fair    Frequency Min 3X/week   Barriers to discharge Decreased caregiver support pt lives alone but reports daughter can stay with her initially    Co-evaluation               AM-PAC PT "6 Clicks" Mobility  Outcome Measure Help needed turning from your back to your side while in a flat bed without using bedrails?: A Little Help needed moving from lying on your back to sitting on the side of a flat bed without using bedrails?: A Little Help needed moving to and from a bed to a chair (including a wheelchair)?: A Little Help needed standing up from a chair using your arms (e.g., wheelchair or bedside chair)?: A Little Help needed  to walk in hospital room?: A Little Help needed climbing 3-5 steps with a railing? : A Lot 6 Click Score: 17    End of Session Equipment Utilized During Treatment: Gait belt Activity Tolerance: Patient tolerated treatment well Patient left: in chair;with call bell/phone within reach;with chair alarm set Nurse Communication: Mobility status PT Visit Diagnosis: Other abnormalities of gait and mobility (R26.89);Muscle weakness (generalized) (M62.81);Difficulty in walking, not elsewhere classified (R26.2)    Time: 6144-3154 PT Time Calculation (min) (ACUTE ONLY): 36 min   Charges:   PT Evaluation $PT Eval Moderate Complexity: 1 Mod PT Treatments $Therapeutic Activity: 8-22 mins        Amanada Philbrick P, PT Acute Rehabilitation Services Pager: [phone] Office: [phone]   Chasmine Lender B Gladine Plude 05/07/2020, 12:01 PM

## 2020-05-07 NOTE — Progress Notes (Signed)
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  GOAL:   Patient will meet greater than or equal to 90% of their needs  MONITOR:   Diet advancement, Labs, Weight trends, I & O's  REASON FOR ASSESSMENT:   Malnutrition Screening Tool    ASSESSMENT:   69 year old female who presented on 7/11 with N/V, constipation. PMH of HTN, CKD stage IIIb, diverticulitis, CVA, CMV colitis. CT scan demonstrating significant stool burden throughout the right and transverse colon with an abrupt transition in the region of the splenic flexure concerning for colonic obstruction. NGT placed to low intermittent suction.   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  Spoke with pt at bedside. RN in room providing nursing care. Pt reports recent lengthy hospital admission. Pt reports that during this time, she lost weight from 202 lbs down to 135 lbs. Pt reports that after she went home, she really tried to eat well to gain weight back and was able to gain back to 178 lbs. Pt reports that she was eating 3 meals a day, eating ice cream, and drinking 1-2 Ensure supplements with each meal.  Pt reports that she began vomiting on Friday night and has not been able to eat since then. Pt reports that her last meal was on Friday and that she had lasagna, fried okra, and greens. Pt states that she is very hungry and is worried about continuing to lose weight. She believes that she has already started to lose weight  again.  Reviewed weight history in chart. Pt with a 5.9 kg weight loss since 03/16/20. This is a 7.1% weight loss in less than 2 months which is significant for timeframe. Pt meets criteria for malnutrition.  Medications reviewed and include: IV solu-cortef, IV protonix, IV abx IVF: NS @ 75 ml/hr  Labs reviewed: BUN 27, creatinine 1.52  NUTRITION - FOCUSED PHYSICAL EXAM:    Most Recent Value  Orbital Region Mild depletion  Upper Arm Region No depletion  Thoracic and Lumbar Region No depletion  Buccal Region Mild depletion  Temple Region Moderate depletion  Clavicle Bone Region Mild depletion  Clavicle and Acromion Bone Region Moderate depletion  Scapular Bone Region Mild depletion  Dorsal Hand Moderate depletion  Patellar Region No depletion  Anterior Thigh Region Mild depletion  Posterior Calf Region Moderate depletion  Edema (RD Assessment) None  Hair Reviewed  Eyes Reviewed  Mouth Reviewed  Skin Reviewed  Nails Reviewed       Diet Order:   Diet Order            Diet NPO time specified  Diet effective now                 EDUCATION NEEDS:   No education needs have been identified at this time  Skin:  Skin Assessment: Reviewed RN Assessment  Last BM:  04/27/20  Height:   Ht Readings from Last 1 Encounters:  05/06/20 5\' 6"  (1.676 m)    Weight:   Wt Readings from Last 1 Encounters:  05/06/20 77.1 kg  Ideal Body Weight:  59.1 kg  BMI:  Body mass index is 27.44 kg/m.  Estimated Nutritional Needs:   Kcal:  1800-2000  Protein:  85-100 grams  Fluid:  1.8-2.0 L    Earma Reading, MS, RD, LDN Inpatient Clinical Dietitian Please see AMiON for contact information.

## 2020-05-07 NOTE — Progress Notes (Signed)
Monitor room tech informed me that pt's heart rate is in the 160s. Pt A&OX4, sitting in chair. Pt breathing equal and unlabored on RA. Pt has mild SOB but denied chest pain. Temp= 98.6 F. HR=136. BP=154/113. POX=100% on RA. Pt assisted back to bed. I will inform Clarita Crane who is the pt's nurse of the above information.

## 2020-05-08 ENCOUNTER — Inpatient Hospital Stay (HOSPITAL_COMMUNITY): Payer: Medicare Other

## 2020-05-08 LAB — CBC WITH DIFFERENTIAL/PLATELET
Abs Immature Granulocytes: 0.08 10*3/uL — ABNORMAL HIGH (ref 0.00–0.07)
Basophils Absolute: 0 10*3/uL (ref 0.0–0.1)
Basophils Relative: 0 %
Eosinophils Absolute: 0 10*3/uL (ref 0.0–0.5)
Eosinophils Relative: 0 %
HCT: 33.5 % — ABNORMAL LOW (ref 36.0–46.0)
Hemoglobin: 10.8 g/dL — ABNORMAL LOW (ref 12.0–15.0)
Immature Granulocytes: 1 %
Lymphocytes Relative: 6 %
Lymphs Abs: 0.9 10*3/uL (ref 0.7–4.0)
MCH: 30.6 pg (ref 26.0–34.0)
MCHC: 32.2 g/dL (ref 30.0–36.0)
MCV: 94.9 fL (ref 80.0–100.0)
Monocytes Absolute: 0.7 10*3/uL (ref 0.1–1.0)
Monocytes Relative: 5 %
Neutro Abs: 12.3 10*3/uL — ABNORMAL HIGH (ref 1.7–7.7)
Neutrophils Relative %: 88 %
Platelets: 246 10*3/uL (ref 150–400)
RBC: 3.53 MIL/uL — ABNORMAL LOW (ref 3.87–5.11)
RDW: 14.1 % (ref 11.5–15.5)
WBC: 14 10*3/uL — ABNORMAL HIGH (ref 4.0–10.5)
nRBC: 0 % (ref 0.0–0.2)

## 2020-05-08 LAB — COMPREHENSIVE METABOLIC PANEL
ALT: 14 U/L (ref 0–44)
AST: 23 U/L (ref 15–41)
Albumin: 3.5 g/dL (ref 3.5–5.0)
Alkaline Phosphatase: 86 U/L (ref 38–126)
Anion gap: 14 (ref 5–15)
BUN: 23 mg/dL (ref 8–23)
CO2: 20 mmol/L — ABNORMAL LOW (ref 22–32)
Calcium: 9.4 mg/dL (ref 8.9–10.3)
Chloride: 108 mmol/L (ref 98–111)
Creatinine, Ser: 1.43 mg/dL — ABNORMAL HIGH (ref 0.44–1.00)
GFR calc Af Amer: 44 mL/min — ABNORMAL LOW (ref >60)
GFR calc non Af Amer: 38 mL/min — ABNORMAL LOW (ref >60)
Glucose, Bld: 130 mg/dL — ABNORMAL HIGH (ref 70–99)
Potassium: 3.8 mmol/L (ref 3.5–5.1)
Sodium: 142 mmol/L (ref 135–145)
Total Bilirubin: 0.7 mg/dL (ref 0.3–1.2)
Total Protein: 6.8 g/dL (ref 6.5–8.1)

## 2020-05-08 LAB — BRAIN NATRIURETIC PEPTIDE: B Natriuretic Peptide: 215.7 pg/mL — ABNORMAL HIGH (ref 0.0–100.0)

## 2020-05-08 LAB — MAGNESIUM: Magnesium: 2.1 mg/dL (ref 1.7–2.4)

## 2020-05-08 MED ORDER — PEG 3350-KCL-NABCB-NACL-NASULF 236 G PO SOLR
4000.0000 mL | Freq: Once | ORAL | Status: AC
  Administered 2020-05-08: 4000 mL
  Filled 2020-05-08 (×2): qty 4000

## 2020-05-08 MED ORDER — DIATRIZOATE MEGLUMINE & SODIUM 66-10 % PO SOLN
360.0000 mL | Freq: Once | ORAL | Status: AC
  Administered 2020-05-08: 360 mL via RECTAL
  Filled 2020-05-08: qty 360

## 2020-05-08 MED ORDER — FLEET ENEMA 7-19 GM/118ML RE ENEM
1.0000 | ENEMA | Freq: Once | RECTAL | Status: AC
  Administered 2020-05-08: 1 via RECTAL
  Filled 2020-05-08: qty 1

## 2020-05-08 MED ORDER — CLONIDINE HCL 0.2 MG PO TABS
0.2000 mg | ORAL_TABLET | ORAL | Status: AC
  Administered 2020-05-08: 0.2 mg via ORAL
  Filled 2020-05-08: qty 1

## 2020-05-08 MED ORDER — WHITE PETROLATUM EX OINT
TOPICAL_OINTMENT | CUTANEOUS | Status: AC
  Administered 2020-05-08: 1
  Filled 2020-05-08: qty 28.35

## 2020-05-08 MED ORDER — KCL IN DEXTROSE-NACL 20-5-0.45 MEQ/L-%-% IV SOLN
INTRAVENOUS | Status: AC
  Filled 2020-05-08 (×4): qty 1000

## 2020-05-08 MED ORDER — FLEET ENEMA 7-19 GM/118ML RE ENEM
1.0000 | ENEMA | Freq: Once | RECTAL | Status: AC
  Administered 2020-05-09: 1 via RECTAL
  Filled 2020-05-08: qty 1

## 2020-05-08 MED ORDER — POLYETHYLENE GLYCOL 3350 17 G PO PACK
17.0000 g | PACK | Freq: Every day | ORAL | Status: DC
  Administered 2020-05-08 – 2020-05-09 (×2): 17 g via ORAL
  Filled 2020-05-08 (×3): qty 1

## 2020-05-08 NOTE — Progress Notes (Addendum)
[facility] Progress Note     Subjective: CC-  Continues to have intermittent abdominal pain requiring pain medication. Pain is mostly in the RLQ. Less bloated and passing small amount of flatus, no BM. NG tube with 450cc output.   Objective: Vital signs in last 24 hours: Temp:  [98.4 F (36.9 C)-99.3 F (37.4 C)] 98.6 F (37 C) (07/13 0605) Pulse Rate:  [98-136] 110 (07/13 0605) Resp:  [14-18] 16 (07/13 0605) BP: (147-184)/(95-132) 153/105 (07/13 0605) SpO2:  [92 %-100 %] 94 % (07/13 0605) Last BM Date: 04/27/20  Intake/Output from previous day: 07/12 0701 - 07/13 0700 In: 1677.3 [I.V.:1279.1; IV Piggyback:398.2] Out: 450 [Emesis/NG output:450] Intake/Output this shift: No intake/output data recorded.  PE: Gen:  Alert, NAD, pleasant HEENT: EOM's intact, pupils equal and round Card:  Mild tachycardia Pulm:  CTAB on anterior exam, no W/R/R, rate and effort normal Abd: Soft, mild distension, few BS heard, no HSM, mild TTP RLQ without rebound or guarding/ no peritonitis  Psych: A&Ox4  Skin: no rashes noted, warm and dry  Lab Results:  Recent Labs    05/07/20 0514 05/08/20 0215  WBC 13.6* 14.0*  HGB 11.5* 10.8*  HCT 35.1* 33.5*  PLT 251 246   BMET Recent Labs    05/07/20 0514 05/08/20 0215  NA 143 142  K 4.4 3.8  CL 109 108  CO2 21* 20*  GLUCOSE 113* 130*  BUN 27* 23  CREATININE 1.52* 1.43*  CALCIUM 9.1 9.4   PT/INR Recent Labs    05/07/20 1022  LABPROT 14.2  INR 1.2   CMP     Component Value Date/Time   NA 142 05/08/2020 0215   K 3.8 05/08/2020 0215   CL 108 05/08/2020 0215   CO2 20 (L) 05/08/2020 0215   GLUCOSE 130 (H) 05/08/2020 0215   BUN 23 05/08/2020 0215   CREATININE 1.43 (H) 05/08/2020 0215   CALCIUM 9.4 05/08/2020 0215   PROT 6.8 05/08/2020 0215   ALBUMIN 3.5 05/08/2020 0215   AST 23 05/08/2020 0215   ALT 14 05/08/2020 0215   ALKPHOS 86 05/08/2020 0215   BILITOT 0.7 05/08/2020 0215   GFRNONAA 38 (L) 05/08/2020 0215    GFRAA 44 (L) 05/08/2020 0215   Lipase     Component Value Date/Time   LIPASE 26 05/06/2020 0814       Studies/Results: CT Abdomen Pelvis Wo Contrast  Result Date: 05/06/2020 CLINICAL DATA:  Abdominal distension.  Nausea and vomiting. EXAM: CT ABDOMEN AND PELVIS WITHOUT CONTRAST TECHNIQUE: Multidetector CT imaging of the abdomen and pelvis was performed following the standard protocol without IV contrast. COMPARISON:  Mar 16, 2020 FINDINGS: Lower chest: No acute abnormality. Hepatobiliary: No focal liver abnormality is seen. No gallstones, gallbladder wall thickening, or biliary dilatation. Pancreas: Unremarkable. No pancreatic ductal dilatation or surrounding inflammatory changes. Spleen: Normal in size without focal abnormality. Adrenals/Urinary Tract: Adrenal glands are unremarkable. Kidneys are normal, without renal calculi, focal lesion, or hydronephrosis. Bladder is unremarkable. Stomach/Bowel: The stomach and small bowel are normal. The descending colon and sigmoid colon are relatively decompressed. There are scattered diverticuli but no diverticulitis. The colon is markedly distended with stool from just beyond the splenic flexure through the level of the cecum. The cecum measures up to 9 cm in diameter. There is gas outlining the stool in the distal cecum and proximal ascending colon. This gas extends non dependently and is different in appearance compared to the remainder of the colon. No definite wall thickening. No venous  gas. The appendix is normal. Vascular/Lymphatic: No significant vascular findings are present. No enlarged abdominal or pelvic lymph nodes. Reproductive: Uterus and bilateral adnexa are unremarkable. Other: No free air or free fluid. Musculoskeletal: A compression fracture of L5 with concavity of the superior endplate was also present in May of 2021 but may be slightly worsened in the interval. No other acute bony abnormalities. IMPRESSION: 1. Marked distension of the  colon from the level of the cecum through the splenic flexure with an abrupt transition point just beyond the splenic flexure. Beyond the transition point, the colon is decompressed. The findings are concerning for a colonic obstruction although an underlying cause is not seen on this study. Recommend colonoscopy to better evaluate the region of transition point. 2. The pattern of gas outlining the stool in the cecum and ascending colon extending dependently raises the possibility of pneumatosis. No definite wall thickening or adjacent fat stranding. No free air. 3. A compression fracture at L5 was present in May of 2021 but appears to have mildly worsened. Findings were called to Dr. Charm Barges. Electronically Signed   By: Gerome Sam III M.D   On: 05/06/2020 13:07   DG Chest Port 1 View  Result Date: 05/07/2020 CLINICAL DATA:  Shortness of breath. EXAM: PORTABLE CHEST 1 VIEW COMPARISON:  November 07, 2019. FINDINGS: The heart size and mediastinal contours are within normal limits. Both lungs are clear. No pneumothorax or pleural effusion is noted. Nasogastric tube tip is seen at the gastroesophageal junction. The visualized skeletal structures are unremarkable. IMPRESSION: No active disease. Nasogastric tube tip seen at gastroesophageal junction. Advancement is recommended. Electronically Signed   By: Lupita Raider M.D.   On: 05/07/2020 09:26   DG Abd Portable 1V  Result Date: 05/07/2020 CLINICAL DATA:  Nasogastric tube placement. EXAM: PORTABLE ABDOMEN - 1 VIEW COMPARISON:  Same day. FINDINGS: The bowel gas pattern is normal. Nasogastric tube tip seen in proximal stomach. No radio-opaque calculi or other significant radiographic abnormality are seen. IMPRESSION: Nasogastric tube tip seen in proximal stomach. Electronically Signed   By: Lupita Raider M.D.   On: 05/07/2020 14:22   DG Abd Portable 1V  Result Date: 05/07/2020 CLINICAL DATA:  Small bowel obstruction, NG tube EXAM: PORTABLE ABDOMEN - 1  VIEW COMPARISON:  None. FINDINGS: NG tube tip is in the distal esophagus. Large stool burden within the colon. No free air organomegaly. IMPRESSION: NG tube tip in the distal esophagus. Electronically Signed   By: Charlett Nose M.D.   On: 05/07/2020 09:25   DG Abd Portable 1V-Small Bowel Obstruction Protocol-initial, 8 hr delay  Result Date: 05/07/2020 CLINICAL DATA:  69 year old female with small bowel obstruction. EXAM: PORTABLE ABDOMEN - 1 VIEW COMPARISON:  Abdominal radiograph dated 05/06/2020. FINDINGS: Partially visualized enteric tube with tip and side-port in the left upper abdomen likely in the proximal stomach. Partially visualized contrast in the stomach. Faint linear high density over the right hemiabdomen, possibly external to the patient and contamination. Air is noted within the colon. No definite bowel dilatation. The soft tissues and osseous structures are grossly unremarkable. IMPRESSION: Enteric tube with tip and side-port in the proximal stomach. No significant interval change. Electronically Signed   By: Elgie Collard M.D.   On: 05/07/2020 02:34   DG Abd Portable 1V-Small Bowel Protocol-Position Verification  Result Date: 05/06/2020 CLINICAL DATA:  Enteric catheter placement EXAM: PORTABLE ABDOMEN - 1 VIEW COMPARISON:  05/06/2020 FINDINGS: Frontal view of the lower chest and upper abdomen excludes the  right hemidiaphragm and lower pelvis by collimation. Tip and side port of an enteric catheter project over the gastric body. Large amount of stool within the proximal colon again noted. IMPRESSION: 1. Enteric catheter overlying gastric body. Electronically Signed   By: Sharlet Salina M.D.   On: 05/06/2020 15:13    Anti-infectives: Anti-infectives (From admission, onward)   Start     Dose/Rate Route Frequency Ordered Stop   05/07/20 1000  metroNIDAZOLE (FLAGYL) IVPB 500 mg     Discontinue     500 mg 100 mL/hr over 60 Minutes Intravenous Every 8 hours 05/07/20 0929     05/07/20  1000  cefTRIAXone (ROCEPHIN) 2 g in sodium chloride 0.9 % 100 mL IVPB     Discontinue     2 g 200 mL/hr over 30 Minutes Intravenous Every 24 hours 05/07/20 0930         Assessment/Plan HTN Hx of CVA 03/2019 - on plavix (last dose 7/11 in AM) Hx paroxysmal a fib, currently in NSR AKI on CKD stage III Rheumatoid arthritis   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 - patient was given small bowel protocol and 8h film shows no bowel dilatation, air in colon and NGT with in proximal stomach - some suggestion of possible pneumatosis on CT from yesterday, lactic was 2.8>>2.3  - pt also has significant stool burden noted in transverse and R colon - GI does not feel there is any benefit to repeating colonoscopy at this time given that she has had several in the last few months - most recent here was 09/2019 and biopsies did not show any malignancy  FEN: NPO, IVF, NGT to LIWS VTE: SCDs, sq heparin ID: rocephin/flagyl 7/12>> Foley: none Follow up: TBD  Plan: Gastrograffin enema this morning. Continue NPO/NGT to LIWS.   LOS: 2 days    Franne Forts, [facility] 05/08/2020, 8:00 AM Please see Amion for pager number during day hours 7:00am-4:30pm

## 2020-05-08 NOTE — Progress Notes (Signed)
-  Attempted to see the patient in the room. - She is currently in the radiology department for Gastrografin enema. -GI will follow later today or tomorrow.  Kathi Der MD, FACP 05/08/2020, 8:39 AM  Contact #  [phone]

## 2020-05-08 NOTE — Progress Notes (Addendum)
PROGRESS NOTE                                                                                                                                                                                                             Patient Demographics:    Michelle Graves, is a 69 y.o. female, DOB - [birth year], ZOX:[id number]  Admit date - 05/06/2020   Admitting Physician Clydie Braun, MD  Outpatient Primary MD for the patient is [facility]  LOS - 2  Chief Complaint  Patient presents with  . Emesis  . Constipation       Brief Narrative Michelle Graves is a 69 y.o. female with medical history significant of hypertension, chronic kidney disease stage IIIb, diverticulitis, and CVA presents with complaints of vomiting black stool since yesterday evening.  Work-up in the ER suggested colonic obstruction.  General surgery and GI were consulted for colonic obstruction and she was admitted to hospitalist service for management of nausea and abdominal pain.    Subjective:   Seen in bed feeling slightly better today, denies any headache, no chest pain or shortness of breath, still not passing flatus, abdomen is distended but no pain.   Assessment  & Plan :     1. SIRS with Colonic obstruction causing nausea vomiting, likely due to chronic inflammation due to diverticulitis versus ischemia in that region.  Continue NG tube to intermittent suction, n.p.o., IV fluids, supportive care, have discussed the case personally with general surgery team and GI physician.  Added Rocephin and IV Flagyl for the possibility of pneumatosis, defer further management to GI and general surgery.  Eventually may require surgical resection of this obstruction.  2.  History of CVA.  Currently strictly n.p.o., medications held risks and benefits explained to daughter.  3.  Hypertension and tachycardia.  Due to abdominal discomfort, symptom control, add Catapres patch along with as  needed IV Lopressor and hydralazine ordered.  4.  CKD stage IIIb.  Baseline creatinine 1.75.  Hydrate and monitor.  5. GERD.  IV PPI.  6. RA - on chronic Steroids, hydrocortisone here.    Condition - Extremely Guarded  Family Communication  : Daughter [name]  [phone] over the phone on 05/07/2020  Code Status :  Full  Consults  :  GI, CCS  Procedures  :    CT -  1. Marked distension of the colon from the level of the cecum through the splenic flexure with an abrupt transition  point just beyond the splenic flexure. Beyond the transition point, the colon is decompressed. The findings are concerning for a colonic obstruction although an underlying cause is not seen on this study. Recommend colonoscopy to better evaluate the region of transition point. 2. The pattern of gas outlining the stool in the cecum and ascending colon extending dependently raises the possibility of pneumatosis. No definite wall thickening or adjacent fat stranding. No free air. 3. A compression fracture at L5 was present in May of 2021 but appears to have mildly worsened.   PUD Prophylaxis : PPI  Disposition Plan  :    Status is: Inpatient  Remains inpatient appropriate because:IV treatments appropriate due to intensity of illness or inability to take PO   Dispo: The patient is from: Home              Anticipated d/c is to: Home              Anticipated d/c date is: > 3 days              Patient currently is not medically stable to d/c.  DVT Prophylaxis  :   Heparin   Lab Results  Component Value Date   PLT 246 05/08/2020    Diet :  Diet Order            Diet NPO time specified Except for: Ice Chips  Diet effective now                  Inpatient Medications Scheduled Meds: . cloNIDine  0.2 mg Oral STAT  . heparin injection (subcutaneous)  5,000 Units Subcutaneous Q8H  . hydrocortisone sod succinate (SOLU-CORTEF) inj  50 mg Intravenous Q8H  . pantoprazole (PROTONIX) IV  40 mg Intravenous  Q12H   Continuous Infusions: . cefTRIAXone (ROCEPHIN)  IV 2 g (05/07/20 1040)  . dextrose 5 % and 0.45 % NaCl with KCl 20 mEq/L    . metronidazole 500 mg (05/08/20 0112)   PRN Meds:.acetaminophen **OR** acetaminophen, albuterol, hydrALAZINE, LORazepam, metoprolol tartrate, morphine injection, [DISCONTINUED] ondansetron **OR** ondansetron (ZOFRAN) IV  Antibiotics  :   Anti-infectives (From admission, onward)   Start     Dose/Rate Route Frequency Ordered Stop   05/07/20 1000  metroNIDAZOLE (FLAGYL) IVPB 500 mg     Discontinue     500 mg 100 mL/hr over 60 Minutes Intravenous Every 8 hours 05/07/20 0929     05/07/20 1000  cefTRIAXone (ROCEPHIN) 2 g in sodium chloride 0.9 % 100 mL IVPB     Discontinue     2 g 200 mL/hr over 30 Minutes Intravenous Every 24 hours 05/07/20 0930          Objective:   Vitals:   05/08/20 0145 05/08/20 0224 05/08/20 0504 05/08/20 0605  BP: (!) 174/124 (!) 148/95 (!) 159/110 (!) 153/105  Pulse: (!) 117 (!) 114 (!) 123 (!) 110  Resp: 16 18 14 16   Temp: 98.4 F (36.9 C) 98.6 F (37 C) 98.9 F (37.2 C) 98.6 F (37 C)  TempSrc: Oral Oral  Oral  SpO2: 97% 96% 92% 94%  Weight:      Height:        SpO2: 94 % O2 Flow Rate (L/min): 0 L/min (pt on room air) FiO2 (%): 21 %  Wt Readings from Last 3 Encounters:  05/06/20 77.1 kg  03/16/20 83 kg  11/09/19 80.8 kg     Intake/Output Summary (Last 24 hours) at 05/08/2020 6415 Last data filed  at 05/08/2020 0800 Gross per 24 hour  Intake 1677.31 ml  Output 450 ml  Net 1227.31 ml     Physical Exam  Awake Alert, No new F.N deficits, Normal affect [place].AT,PERRAL Supple Neck,No JVD, No cervical lymphadenopathy appriciated.  Symmetrical Chest wall movement, Good air movement bilaterally, CTAB RRR,No Gallops,Rubs or new Murmurs, No Parasternal Heave +ve B.Sounds, Abd mildly distended with NG tube in place  no Cyanosis, Clubbing or edema, No new Rash or bruise     Data Review:    Recent Labs  Lab  05/06/20 0814 05/06/20 1624 05/07/20 0514 05/08/20 0215  WBC 14.9*  --  13.6* 14.0*  HGB 12.3 11.5* 11.5* 10.8*  HCT 37.7 35.8* 35.1* 33.5*  PLT 257  --  251 246  MCV 92.4  --  94.6 94.9  MCH 30.1  --  31.0 30.6  MCHC 32.6  --  32.8 32.2  RDW 13.3  --  13.9 14.1  LYMPHSABS  --   --   --  0.9  MONOABS  --   --   --  0.7  EOSABS  --   --   --  0.0  BASOSABS  --   --   --  0.0    Recent Labs  Lab 05/06/20 0814 05/06/20 1624 05/07/20 0514 05/07/20 1022 05/08/20 0215  NA 138  --  143  --  142  K 3.8  --  4.4  --  3.8  CL 103  --  109  --  108  CO2 23  --  21*  --  20*  GLUCOSE 165*  --  113*  --  130*  BUN 32*  --  27*  --  23  CREATININE 1.74*  --  1.52*  --  1.43*  CALCIUM 9.8  --  9.1  --  9.4  AST 19  --  24  --  23  ALT 15  --  16  --  14  ALKPHOS 107  --  82  --  86  BILITOT 0.5  --  0.9  --  0.7  ALBUMIN 4.0  --  3.6  --  3.5  MG  --   --   --   --  2.1  CRP  --  0.6  --   --   --   INR  --   --   --  1.2  --   BNP  --   --   --   --  215.7*    Recent Labs  Lab 05/06/20 1341 05/06/20 1624 05/08/20 0215  CRP  --  0.6  --   BNP  --   --  215.7*  SARSCOV2NAA NEGATIVE  --   --     ------------------------------------------------------------------------------------------------------------------ No results for input(s): CHOL, HDL, LDLCALC, TRIG, CHOLHDL, LDLDIRECT in the last 72 hours.  Lab Results  Component Value Date   HGBA1C 6.2 (H) 04/01/2019   ------------------------------------------------------------------------------------------------------------------ No results for input(s): TSH, T4TOTAL, T3FREE, THYROIDAB in the last 72 hours.  Invalid input(s): FREET3 ------------------------------------------------------------------------------------------------------------------ No results for input(s): VITAMINB12, FOLATE, FERRITIN, TIBC, IRON, RETICCTPCT in the last 72 hours.  Coagulation profile Recent Labs  Lab 05/07/20 1022  INR 1.2    No  results for input(s): DDIMER in the last 72 hours.  Cardiac Enzymes No results for input(s): CKMB, TROPONINI, MYOGLOBIN in the last 168 hours.  Invalid input(s): CK ------------------------------------------------------------------------------------------------------------------    Component Value Date/Time   BNP 215.7 (H) 05/08/2020 0215  Micro Results Recent Results (from the past 240 hour(s))  SARS Coronavirus 2 by RT PCR (hospital order, performed in [facility] hospital lab) Nasopharyngeal Nasopharyngeal Swab     Status: None   Collection Time: 05/06/20  1:41 PM   Specimen: Nasopharyngeal Swab  Result Value Ref Range Status   SARS Coronavirus 2 NEGATIVE NEGATIVE Final    Comment: (NOTE) SARS-CoV-2 target nucleic acids are NOT DETECTED.  The SARS-CoV-2 RNA is generally detectable in upper and lower respiratory specimens during the acute phase of infection. The lowest concentration of SARS-CoV-2 viral copies this assay can detect is 250 copies / mL. A negative result does not preclude SARS-CoV-2 infection and should not be used as the sole basis for treatment or other patient management decisions.  A negative result may occur with improper specimen collection / handling, submission of specimen other than nasopharyngeal swab, presence of viral mutation(s) within the areas targeted by this assay, and inadequate number of viral copies (<250 copies / mL). A negative result must be combined with clinical observations, patient history, and epidemiological information.  Fact Sheet for Patients:   BoilerBrush.com.cy  Fact Sheet for Healthcare Providers: https://pope.com/  This test is not yet approved or  cleared by the [place] FDA and has been authorized for detection and/or diagnosis of SARS-CoV-2 by FDA under an Emergency Use Authorization (EUA).  This EUA will remain in effect (meaning this test can be used) for the  duration of the COVID-19 declaration under Section 564(b)(1) of the Act, 21 U.S.C. section 360bbb-3(b)(1), unless the authorization is terminated or revoked sooner.  Performed at [facility], [address] [place], [place] [address]     Radiology Reports CT Abdomen Pelvis Wo Contrast  Result Date: 05/06/2020 CLINICAL DATA:  Abdominal distension.  Nausea and vomiting. EXAM: CT ABDOMEN AND PELVIS WITHOUT CONTRAST TECHNIQUE: Multidetector CT imaging of the abdomen and pelvis was performed following the standard protocol without IV contrast. COMPARISON:  Mar 16, 2020 FINDINGS: Lower chest: No acute abnormality. Hepatobiliary: No focal liver abnormality is seen. No gallstones, gallbladder wall thickening, or biliary dilatation. Pancreas: Unremarkable. No pancreatic ductal dilatation or surrounding inflammatory changes. Spleen: Normal in size without focal abnormality. Adrenals/Urinary Tract: Adrenal glands are unremarkable. Kidneys are normal, without renal calculi, focal lesion, or hydronephrosis. Bladder is unremarkable. Stomach/Bowel: The stomach and small bowel are normal. The descending colon and sigmoid colon are relatively decompressed. There are scattered diverticuli but no diverticulitis. The colon is markedly distended with stool from just beyond the splenic flexure through the level of the cecum. The cecum measures up to 9 cm in diameter. There is gas outlining the stool in the distal cecum and proximal ascending colon. This gas extends non dependently and is different in appearance compared to the remainder of the colon. No definite wall thickening. No venous gas. The appendix is normal. Vascular/Lymphatic: No significant vascular findings are present. No enlarged abdominal or pelvic lymph nodes. Reproductive: Uterus and bilateral adnexa are unremarkable. Other: No free air or free fluid. Musculoskeletal: A compression fracture of L5 with concavity of the superior endplate was also  present in May of 2021 but may be slightly worsened in the interval. No other acute bony abnormalities. IMPRESSION: 1. Marked distension of the colon from the level of the cecum through the splenic flexure with an abrupt transition point just beyond the splenic flexure. Beyond the transition point, the colon is decompressed. The findings are concerning for a colonic obstruction although an underlying cause is  not seen on this study. Recommend colonoscopy to better evaluate the region of transition point. 2. The pattern of gas outlining the stool in the cecum and ascending colon extending dependently raises the possibility of pneumatosis. No definite wall thickening or adjacent fat stranding. No free air. 3. A compression fracture at L5 was present in May of 2021 but appears to have mildly worsened. Findings were called to Dr. Charm Barges. Electronically Signed   By: Gerome Sam III M.D   On: 05/06/2020 13:07   DG Chest Port 1 View  Result Date: 05/07/2020 CLINICAL DATA:  Shortness of breath. EXAM: PORTABLE CHEST 1 VIEW COMPARISON:  November 07, 2019. FINDINGS: The heart size and mediastinal contours are within normal limits. Both lungs are clear. No pneumothorax or pleural effusion is noted. Nasogastric tube tip is seen at the gastroesophageal junction. The visualized skeletal structures are unremarkable. IMPRESSION: No active disease. Nasogastric tube tip seen at gastroesophageal junction. Advancement is recommended. Electronically Signed   By: Lupita Raider M.D.   On: 05/07/2020 09:26   DG Abd Portable 1V  Result Date: 05/07/2020 CLINICAL DATA:  Nasogastric tube placement. EXAM: PORTABLE ABDOMEN - 1 VIEW COMPARISON:  Same day. FINDINGS: The bowel gas pattern is normal. Nasogastric tube tip seen in proximal stomach. No radio-opaque calculi or other significant radiographic abnormality are seen. IMPRESSION: Nasogastric tube tip seen in proximal stomach. Electronically Signed   By: Lupita Raider M.D.   On:  05/07/2020 14:22   DG Abd Portable 1V  Result Date: 05/07/2020 CLINICAL DATA:  Small bowel obstruction, NG tube EXAM: PORTABLE ABDOMEN - 1 VIEW COMPARISON:  None. FINDINGS: NG tube tip is in the distal esophagus. Large stool burden within the colon. No free air organomegaly. IMPRESSION: NG tube tip in the distal esophagus. Electronically Signed   By: Charlett Nose M.D.   On: 05/07/2020 09:25   DG Abd Portable 1V-Small Bowel Obstruction Protocol-initial, 8 hr delay  Result Date: 05/07/2020 CLINICAL DATA:  69 year old female with small bowel obstruction. EXAM: PORTABLE ABDOMEN - 1 VIEW COMPARISON:  Abdominal radiograph dated 05/06/2020. FINDINGS: Partially visualized enteric tube with tip and side-port in the left upper abdomen likely in the proximal stomach. Partially visualized contrast in the stomach. Faint linear high density over the right hemiabdomen, possibly external to the patient and contamination. Air is noted within the colon. No definite bowel dilatation. The soft tissues and osseous structures are grossly unremarkable. IMPRESSION: Enteric tube with tip and side-port in the proximal stomach. No significant interval change. Electronically Signed   By: Elgie Collard M.D.   On: 05/07/2020 02:34   DG Abd Portable 1V-Small Bowel Protocol-Position Verification  Result Date: 05/06/2020 CLINICAL DATA:  Enteric catheter placement EXAM: PORTABLE ABDOMEN - 1 VIEW COMPARISON:  05/06/2020 FINDINGS: Frontal view of the lower chest and upper abdomen excludes the right hemidiaphragm and lower pelvis by collimation. Tip and side port of an enteric catheter project over the gastric body. Large amount of stool within the proximal colon again noted. IMPRESSION: 1. Enteric catheter overlying gastric body. Electronically Signed   By: Sharlet Salina M.D.   On: 05/06/2020 15:13     Time Spent in minutes  30   Susa Raring M.D on 05/08/2020 at 9:29 AM  To page go to [email or web address] - password [email or web address]

## 2020-05-08 NOTE — Progress Notes (Signed)
Barium enema films reviewed.  Case discussed with Dr. Levora Angel.  No stricture was identified to the mid transverse colon.  Some stool is present distally.  The patient indicates she feels somewhat better.   On exam, the abdomen is slightly protuberant but not overtly distended; there are minimal bowel sounds but the abdomen is quite soft and nontender and very mildly tympanic.  Labs show persistent mild leukocytosis of 14,000, stable hemoglobin of 10.8, and normal electrolytes and liver chemistries.  Moderate renal dysfunction is noted.  Impression: It remains unclear what is going on with this patient's colon.  It is unclear whether there is an anatomic, versus a functional, cause for the predominance of stool proximally.  Recommendation: I would favor an attempt at least partial colonoscopic evaluation following a Fleet enema prep.  I have reviewed the purpose and risks of the procedure, including possibly increased risk in view of upstream dilatation of the colon and hence the necessity to minimize insufflation of gas during the procedure.  Depending on those findings, we might feel that the patient could eventually have a gradual, gentle prep during this admission and a complete, more formal colonoscopy.  Florencia Reasons, M.D. Pager [phone] If no answer or after 5 PM call [phone]

## 2020-05-09 ENCOUNTER — Encounter (HOSPITAL_COMMUNITY): Payer: Self-pay | Admitting: Internal Medicine

## 2020-05-09 ENCOUNTER — Inpatient Hospital Stay (HOSPITAL_COMMUNITY): Payer: Medicare Other | Admitting: Anesthesiology

## 2020-05-09 ENCOUNTER — Encounter (HOSPITAL_COMMUNITY)
Admission: EM | Disposition: A | Discharge status: Home / Self Care | Payer: Self-pay | Source: Home / Self Care | Attending: Internal Medicine

## 2020-05-09 LAB — COMPREHENSIVE METABOLIC PANEL
ALT: 14 U/L (ref 0–44)
AST: 18 U/L (ref 15–41)
Albumin: 2.9 g/dL — ABNORMAL LOW (ref 3.5–5.0)
Alkaline Phosphatase: 69 U/L (ref 38–126)
Anion gap: 11 (ref 5–15)
BUN: 20 mg/dL (ref 8–23)
CO2: 21 mmol/L — ABNORMAL LOW (ref 22–32)
Calcium: 8.8 mg/dL — ABNORMAL LOW (ref 8.9–10.3)
Chloride: 109 mmol/L (ref 98–111)
Creatinine, Ser: 1.22 mg/dL — ABNORMAL HIGH (ref 0.44–1.00)
GFR calc Af Amer: 53 mL/min — ABNORMAL LOW (ref >60)
GFR calc non Af Amer: 46 mL/min — ABNORMAL LOW (ref >60)
Glucose, Bld: 136 mg/dL — ABNORMAL HIGH (ref 70–99)
Potassium: 3.8 mmol/L (ref 3.5–5.1)
Sodium: 141 mmol/L (ref 135–145)
Total Bilirubin: 0.3 mg/dL (ref 0.3–1.2)
Total Protein: 5.8 g/dL — ABNORMAL LOW (ref 6.5–8.1)

## 2020-05-09 LAB — CBC WITH DIFFERENTIAL/PLATELET
Abs Immature Granulocytes: 0.1 10*3/uL — ABNORMAL HIGH (ref 0.00–0.07)
Basophils Absolute: 0 10*3/uL (ref 0.0–0.1)
Basophils Relative: 0 %
Eosinophils Absolute: 0 10*3/uL (ref 0.0–0.5)
Eosinophils Relative: 0 %
HCT: 29.8 % — ABNORMAL LOW (ref 36.0–46.0)
Hemoglobin: 9.6 g/dL — ABNORMAL LOW (ref 12.0–15.0)
Immature Granulocytes: 1 %
Lymphocytes Relative: 11 %
Lymphs Abs: 1.5 10*3/uL (ref 0.7–4.0)
MCH: 30.7 pg (ref 26.0–34.0)
MCHC: 32.2 g/dL (ref 30.0–36.0)
MCV: 95.2 fL (ref 80.0–100.0)
Monocytes Absolute: 1 10*3/uL (ref 0.1–1.0)
Monocytes Relative: 8 %
Neutro Abs: 10.5 10*3/uL — ABNORMAL HIGH (ref 1.7–7.7)
Neutrophils Relative %: 80 %
Platelets: 210 10*3/uL (ref 150–400)
RBC: 3.13 MIL/uL — ABNORMAL LOW (ref 3.87–5.11)
RDW: 13.9 % (ref 11.5–15.5)
WBC: 13.1 10*3/uL — ABNORMAL HIGH (ref 4.0–10.5)
nRBC: 0 % (ref 0.0–0.2)

## 2020-05-09 LAB — MAGNESIUM: Magnesium: 2 mg/dL (ref 1.7–2.4)

## 2020-05-09 LAB — BRAIN NATRIURETIC PEPTIDE: B Natriuretic Peptide: 173.4 pg/mL — ABNORMAL HIGH (ref 0.0–100.0)

## 2020-05-09 SURGERY — INVASIVE LAB ABORTED CASE

## 2020-05-09 MED ORDER — LACTATED RINGERS IV SOLN: INTRAVENOUS | Status: DC

## 2020-05-09 MED ORDER — METOCLOPRAMIDE HCL 5 MG/ML IJ SOLN
5.0000 mg | Freq: Once | INTRAMUSCULAR | Status: AC
  Administered 2020-05-09: 5 mg via INTRAVENOUS
  Filled 2020-05-09: qty 2
  Filled 2020-05-09: qty 1

## 2020-05-09 MED ORDER — PROPOFOL 500 MG/50ML IV EMUL
INTRAVENOUS | Status: DC | PRN
  Administered 2020-05-09: 100 ug/kg/min via INTRAVENOUS

## 2020-05-09 MED ORDER — PROPOFOL 10 MG/ML IV BOLUS
INTRAVENOUS | Status: DC | PRN
  Administered 2020-05-09: 20 mg via INTRAVENOUS

## 2020-05-09 MED ORDER — HYDROCORTISONE NA SUCCINATE PF 100 MG IJ SOLR
25.0000 mg | Freq: Three times a day (TID) | INTRAMUSCULAR | Status: DC
  Administered 2020-05-09 – 2020-05-10 (×3): 25 mg via INTRAVENOUS
  Filled 2020-05-09 (×3): qty 2

## 2020-05-09 MED ORDER — PEG 3350-KCL-NA BICARB-NACL 420 G PO SOLR
4000.0000 mL | Freq: Once | ORAL | Status: AC
  Administered 2020-05-09: 4000 mL via ORAL
  Filled 2020-05-09: qty 4000

## 2020-05-09 MED ORDER — SODIUM CHLORIDE 0.9 % IV SOLN: INTRAVENOUS | Status: DC

## 2020-05-09 SURGICAL SUPPLY — 22 items

## 2020-05-09 NOTE — Progress Notes (Signed)
[facility] Progress Note     Subjective: CC-  Feeling much better today. Abdomen with little to no pain. Denies n/v. Passing flatus and has had multiple loose BMs. GI planning for colonoscopy today. Patient hopeful to go home soon.  Objective: Vital signs in last 24 hours: Temp:  [97.8 F (36.6 C)-99.5 F (37.5 C)] 99.1 F (37.3 C) (07/14 0457) Pulse Rate:  [105-129] 105 (07/14 0457) Resp:  [16-18] 16 (07/14 0457) BP: (136-162)/(95-120) 136/95 (07/14 0457) SpO2:  [97 %-100 %] 98 % (07/14 0457) Last BM Date: 05/08/20  Intake/Output from previous day: 07/13 0701 - 07/14 0700 In: 1799.1 [I.V.:1397.3; IV Piggyback:401.8] Out: 1950 [Emesis/NG output:1950] Intake/Output this shift: No intake/output data recorded.  PE: Gen:  Alert, NAD, pleasant HEENT: EOM's intact, pupils equal and round Card:  Mild tachycardia Pulm:  CTAB on anterior exam, no W/R/R, rate and effort normal Abd: Soft, minimal distension, + BS, no HSM, nontender Psych: A&Ox4  Skin: no rashes noted, warm and dry   Lab Results:  Recent Labs    05/08/20 0215 05/09/20 0407  WBC 14.0* 13.1*  HGB 10.8* 9.6*  HCT 33.5* 29.8*  PLT 246 210   BMET Recent Labs    05/08/20 0215 05/09/20 0407  NA 142 141  K 3.8 3.8  CL 108 109  CO2 20* 21*  GLUCOSE 130* 136*  BUN 23 20  CREATININE 1.43* 1.22*  CALCIUM 9.4 8.8*   PT/INR Recent Labs    05/07/20 1022  LABPROT 14.2  INR 1.2   CMP     Component Value Date/Time   NA 141 05/09/2020 0407   K 3.8 05/09/2020 0407   CL 109 05/09/2020 0407   CO2 21 (L) 05/09/2020 0407   GLUCOSE 136 (H) 05/09/2020 0407   BUN 20 05/09/2020 0407   CREATININE 1.22 (H) 05/09/2020 0407   CALCIUM 8.8 (L) 05/09/2020 0407   PROT 5.8 (L) 05/09/2020 0407   ALBUMIN 2.9 (L) 05/09/2020 0407   AST 18 05/09/2020 0407   ALT 14 05/09/2020 0407   ALKPHOS 69 05/09/2020 0407   BILITOT 0.3 05/09/2020 0407   GFRNONAA 46 (L) 05/09/2020 0407   GFRAA 53 (L) 05/09/2020 0407    Lipase     Component Value Date/Time   LIPASE 26 05/06/2020 0814       Studies/Results: DG Abd Portable 1V  Result Date: 05/07/2020 CLINICAL DATA:  Nasogastric tube placement. EXAM: PORTABLE ABDOMEN - 1 VIEW COMPARISON:  Same day. FINDINGS: The bowel gas pattern is normal. Nasogastric tube tip seen in proximal stomach. No radio-opaque calculi or other significant radiographic abnormality are seen. IMPRESSION: Nasogastric tube tip seen in proximal stomach. Electronically Signed   By: Lupita Raider M.D.   On: 05/07/2020 14:22   DG BE (COLON)W SINGLE CM (SOL OR THIN BA)  Result Date: 05/08/2020 CLINICAL DATA:  Clinical constipation. Abdominal pain. CT demonstrating colonic distension to the level of a transition point just beyond the splenic flexure. Prior colonoscopy demonstrating an area of inflamed mucosa within the distal transverse colon (negative biopsy). EXAM: WATER SOLUBLE CONTRAST ENEMA TECHNIQUE: Single-contrast enema performed with limitations secondary to patient immobility and clinical status. FLUOROSCOPY TIME:  Fluoroscopy Time:  4 minutes and 30 seconds Radiation Exposure Index (if provided by the fluoroscopic device): Not applicable. Number of Acquired Spot Images: 0 COMPARISON:  Plain film 05/07/2020. CT 05/06/2020. Colonoscopy from 10/05/2019. FINDINGS: Preprocedure scout film demonstrates a large amount of stool throughout the colon. Contrast and stool within the ascending colon.  Nasogastric tube terminating over the gastric body. Rectal contrast administration demonstrates large amount of stool throughout the colon. No dominant stricture identified within the descending or sigmoid colon. Scattered colonic diverticula. Contrast partially opacifies to the level of the mid transverse colon, which is primarily stool-filled and moderately dilated. Due to patient discomfort, the more proximal colon could not be opacified. IMPRESSION: Limited, single-contrast exam, as detailed above.  Contrast opacifies to the level of the mid transverse colon, which is primarily stool-filled. Given above limitations, no dominant stricture identified. Electronically Signed   By: Jeronimo Greaves M.D.   On: 05/08/2020 09:48    Anti-infectives: Anti-infectives (From admission, onward)   Start     Dose/Rate Route Frequency Ordered Stop   05/07/20 1000  metroNIDAZOLE (FLAGYL) IVPB 500 mg     Discontinue     500 mg 100 mL/hr over 60 Minutes Intravenous Every 8 hours 05/07/20 0929     05/07/20 1000  cefTRIAXone (ROCEPHIN) 2 g in sodium chloride 0.9 % 100 mL IVPB     Discontinue     2 g 200 mL/hr over 30 Minutes Intravenous Every 24 hours 05/07/20 0930         Assessment/Plan HTN Hx of CVA 03/2019 - on plavix (last dose 7/11 in AM) Hx paroxysmal a fib, currently in NSR AKI on CKD stage III Rheumatoid arthritis   ?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 I think her NG tube can come out, and hopefully advance diet after colonoscopy.    LOS: 3 days    Franne Forts, [facility] Surgery 05/09/2020, 9:44 AM Please see Amion for pager number during day hours 7:00am-4:30pm

## 2020-05-09 NOTE — Interval H&P Note (Signed)
History and Physical Interval Note:  05/09/2020 12:12 PM  Michelle Graves  has presented today for surgery, with the diagnosis of Colitis, history of colonic stricture.  The various methods of treatment have been discussed with the patient. After consideration of risks, benefits and other options for treatment, the patient has consented to  Procedure(s): COLONOSCOPY WITH PROPOFOL (N/A) as a surgical intervention.  The patient's history has been reviewed, patient examined, no change in status, stable for surgery.  I have reviewed the patient's chart and labs.  Questions were answered to the patient's satisfaction.     Katy Fitch [name]

## 2020-05-09 NOTE — Progress Notes (Signed)
PT Cancellation Note  Patient Details Name: Michelle Graves MRN: [id number] DOB: [birth year]   Cancelled Treatment:    Reason Eval/Treat Not Completed: (P) Other (comment) (Currently on commode prepping bowels for test in am.  Will defer PT today.)   [name] 05/09/2020, 5:29 PM  Bonney Leitz , PTA Acute Rehabilitation Services Pager [phone] Office [phone]

## 2020-05-09 NOTE — Op Note (Signed)
[facility] Patient Name: Michelle Graves Procedure Date : 05/09/2020 MRN: [id number] Attending MD: Bernette Redbird , MD Date of Birth: 1951-03-13 CSN: [id number] Age: 69 Admit Type: Inpatient Procedure:                Colonoscopy Indications:              Abnormal CT of the GI tract Providers:                Bernette Redbird, MD, Roselie Awkward, RN, Lawson Radar, Technician, Romie Minus, CRNA Referring MD:              Medicines:                Monitored Anesthesia Care Complications:            No immediate complications. Estimated Blood Loss:     Estimated blood loss: none. Procedure:                Pre-Anesthesia Assessment:                           - Prior to the procedure, a History and Physical                            was performed, and patient medications and                            allergies were reviewed. The patient's tolerance of                            previous anesthesia was also reviewed. The risks                            and benefits of the procedure and the sedation                            options and risks were discussed with the patient.                            All questions were answered, and informed consent                            was obtained. Prior Anticoagulants: The patient has                            taken no previous anticoagulant or antiplatelet                            agents. ASA Grade Assessment: III - A patient with                            severe systemic disease. After reviewing the risks  and benefits, the patient was deemed in                            satisfactory condition to undergo the procedure.                           After obtaining informed consent, the colonoscope                            was passed under direct vision. Throughout the                            procedure, the patient's blood pressure, pulse, and                             oxygen saturations were monitored continuously. The                            PCF-H190DL ([id number] Olympus pediatric colonscope                            was introduced through the anus with the intention                            of advancing to the cecum. The scope was advanced                            to the rectum or distal sigmoid region before the                            procedure was aborted. Medications were given. The                            colonoscopy was aborted due to poor bowel prep with                            stool present. Scope In: 12:32:20 PM Scope Out: 12:34:45 PM Total Procedure Duration: 0 hours 2 minutes 25 seconds  Findings:      The perianal and digital rectal examinations were normal.      A moderate amount of stool was found in the rectum and distal sigmoid,       interfering with visualization. It was felt that further attempts at       cleansing and advancement would not be effective, so the procedure was       terminated. Impression:               - The procedure was aborted due to poor bowel prep                            with stool present.                           - Stool (solid and liquid) in the rectum.                           -  No specimens collected. Recommendation:           - Repeat colonoscopy tomorrow, following additional                            prep, because the examination was incomplete. Procedure Code(s):        --- Professional ---                           [phone], 53, Colonoscopy, flexible; diagnostic,                            including collection of specimen(s) by brushing or                            washing, when performed (separate procedure) Diagnosis Code(s):        --- Professional ---                           R93.3, Abnormal findings on diagnostic imaging of                            other parts of digestive tract CPT copyright 2019 American [employer]. All rights reserved. The codes documented in  this report are preliminary and upon coder review may  be revised to meet current compliance requirements. Bernette Redbird, MD 05/09/2020 12:54:03 PM This report has been signed electronically. Number of Addenda: 0

## 2020-05-09 NOTE — Anesthesia Preprocedure Evaluation (Addendum)
Anesthesia Evaluation  Patient identified by MRN, date of birth, ID band Patient awake    Reviewed: Allergy & Precautions, NPO status , Patient's Chart, lab work & pertinent test results  Airway Mallampati: II  TM Distance: >3 FB Neck ROM: Full    Dental no notable dental hx. (+) Lower Dentures, Upper Dentures, Dental Advisory Given   Pulmonary    Pulmonary exam normal breath sounds clear to auscultation       Cardiovascular hypertension, Pt. on medications Normal cardiovascular exam Rhythm:Regular Rate:Normal     Neuro/Psych Depression CVA    GI/Hepatic   Endo/Other    Renal/GU Renal InsufficiencyRenal disease     Musculoskeletal   Abdominal   Peds  Hematology  (+) anemia , Hgb 9.6   Anesthesia Other Findings   Reproductive/Obstetrics                            Anesthesia Physical Anesthesia Plan  ASA: III  Anesthesia Plan: MAC   Post-op Pain Management:    Induction:   PONV Risk Score and Plan: Treatment may vary due to age or medical condition  Airway Management Planned: Nasal Cannula and Natural Airway  Additional Equipment: None  Intra-op Plan:   Post-operative Plan:   Informed Consent: I have reviewed the patients History and Physical, chart, labs and discussed the procedure including the risks, benefits and alternatives for the proposed anesthesia with the patient or authorized representative who has indicated his/her understanding and acceptance.     Dental advisory given  Plan Discussed with:   Anesthesia Plan Comments: (Case aborted yesterday due to inadequate prep)       Anesthesia Quick Evaluation

## 2020-05-09 NOTE — Anesthesia Preprocedure Evaluation (Addendum)
Anesthesia Evaluation  Patient identified by MRN, date of birth, ID band Patient awake    Reviewed: Allergy & Precautions, NPO status , Patient's Chart, lab work & pertinent test results  Airway Mallampati: II  TM Distance: >3 FB Neck ROM: Full    Dental  (+) Upper Dentures, Lower Dentures, Dental Advisory Given   Pulmonary    Pulmonary exam normal        Cardiovascular hypertension, Pt. on medications Normal cardiovascular exam     Neuro/Psych Depression CVA    GI/Hepatic   Endo/Other    Renal/GU Renal InsufficiencyRenal disease     Musculoskeletal   Abdominal   Peds  Hematology   Anesthesia Other Findings   Reproductive/Obstetrics                            Anesthesia Physical Anesthesia Plan  ASA: III  Anesthesia Plan: MAC   Post-op Pain Management:    Induction: Intravenous  PONV Risk Score and Plan: Treatment may vary due to age or medical condition  Airway Management Planned: Nasal Cannula  Additional Equipment:   Intra-op Plan:   Post-operative Plan:   Informed Consent: I have reviewed the patients History and Physical, chart, labs and discussed the procedure including the risks, benefits and alternatives for the proposed anesthesia with the patient or authorized representative who has indicated his/her understanding and acceptance.       Plan Discussed with: CRNA and Surgeon  Anesthesia Plan Comments:         Anesthesia Quick Evaluation

## 2020-05-09 NOTE — Progress Notes (Signed)
Patient ID: Michelle Graves, female   DOB: January 22, 1951, 69 y.o.   MRN: [id number]  PROGRESS NOTE    Michelle Graves  MEB:[id number] DOB: 1951/07/01 DOA: 05/06/2020 PCP: [facility]   Brief Narrative:  69 year old female with history of hypertension, chronic kidney disease stage IIIb, diverticulitis and unspecified CVA presented on 05/06/2020 with black stool vomiting.  Work-up in the ED suggested colonic obstruction.  General surgery and GI were consulted.  Assessment & Plan:   Colonic obstruction with a possibility of pneumatosis SIRS: Probably from above -GI and general surgery following.  Currently has NG tube.  Continue IV fluids, n.p.o. -Continue Rocephin and Flagyl  Hypertension -Blood pressure still elevated. -Continue as needed hydralazine and Lopressor  Chronic kidney disease stage IIIb -Creatinine stable.  Monitor  GERD -Continue IV PPI  Rheumatoid arthritis -On chronic steroids.  Currently on IV hydrocortisone.   DVT prophylaxis: Heparin subcutaneous Code Status: Full Family Communication: Patient at bedside Disposition Plan: Status is: Inpatient  Remains inpatient appropriate because:Inpatient level of care appropriate due to severity of illness   Dispo: The patient is from: Home              Anticipated d/c is to: Home              Anticipated d/c date is: > 3 days              Patient currently is not medically stable to d/c.    Consultants: GI/general surgery  Procedures: None  Antimicrobials:  Anti-infectives (From admission, onward)   Start     Dose/Rate Route Frequency Ordered Stop   05/07/20 1000  metroNIDAZOLE (FLAGYL) IVPB 500 mg     Discontinue     500 mg 100 mL/hr over 60 Minutes Intravenous Every 8 hours 05/07/20 0929     05/07/20 1000  cefTRIAXone (ROCEPHIN) 2 g in sodium chloride 0.9 % 100 mL IVPB     Discontinue     2 g 200 mL/hr over 30 Minutes Intravenous Every 24 hours 05/07/20 0930          Subjective: Patient seen and examined at bedside.  She denies worsening abdominal pain.  No overnight fever or vomiting reported.  She is having some bowel movements.  Objective: Vitals:   05/08/20 1351 05/08/20 1903 05/08/20 2222 05/09/20 0457  BP: (!) 162/120 (!) 146/97 (!) 143/101 (!) 136/95  Pulse: (!) 110 (!) 109 (!) 106 (!) 105  Resp: 18 18 17 16   Temp: 98.8 F (37.1 C) 97.8 F (36.6 C) 99.5 F (37.5 C) 99.1 F (37.3 C)  TempSrc: Oral Oral Oral Oral  SpO2: 100% 100% 97% 98%  Weight:      Height:        Intake/Output Summary (Last 24 hours) at 05/09/2020 0903 Last data filed at 05/09/2020 0600 Gross per 24 hour  Intake 1799.11 ml  Output 1950 ml  Net -150.89 ml   Filed Weights   05/06/20 0753  Weight: 77.1 kg    Examination:  General exam: Appears calm and comfortable. ENT: NG tube present Respiratory system: Bilateral decreased breath sounds at bases with some scattered crackles Cardiovascular system: S1 & S2 heard, tachycardic  gastrointestinal system: Abdomen is nondistended, soft and mildly tender diffusely.  Bowel sounds sluggish Extremities: No cyanosis, clubbing; trace lower extremity edema Central nervous system: Alert and oriented. No focal neurological deficits. Moving extremities Skin: No rashes, lesions or ulcers Psychiatry: Judgement and insight appear normal. Mood &  affect appropriate.     Data Reviewed: I have personally reviewed following labs and imaging studies  CBC: Recent Labs  Lab 05/06/20 0814 05/06/20 1624 05/07/20 0514 05/08/20 0215 05/09/20 0407  WBC 14.9*  --  13.6* 14.0* 13.1*  NEUTROABS  --   --   --  12.3* 10.5*  HGB 12.3 11.5* 11.5* 10.8* 9.6*  HCT 37.7 35.8* 35.1* 33.5* 29.8*  MCV 92.4  --  94.6 94.9 95.2  PLT 257  --  251 246 210   Basic Metabolic Panel: Recent Labs  Lab 05/06/20 0814 05/07/20 0514 05/08/20 0215 05/09/20 0407  NA 138 143 142 141  K 3.8 4.4 3.8 3.8  CL 103 109 108 109  CO2 23 21* 20*  21*  GLUCOSE 165* 113* 130* 136*  BUN 32* 27* 23 20  CREATININE 1.74* 1.52* 1.43* 1.22*  CALCIUM 9.8 9.1 9.4 8.8*  MG  --   --  2.1 2.0   GFR: Estimated Creatinine Clearance: 46.9 mL/min (A) (by C-G formula based on SCr of 1.22 mg/dL (H)). Liver Function Tests: Recent Labs  Lab 05/06/20 0814 05/07/20 0514 05/08/20 0215 05/09/20 0407  AST 19 24 23 18   ALT 15 16 14 14   ALKPHOS 107 82 86 69  BILITOT 0.5 0.9 0.7 0.3  PROT 7.9 6.6 6.8 5.8*  ALBUMIN 4.0 3.6 3.5 2.9*   Recent Labs  Lab 05/06/20 0814  LIPASE 26   No results for input(s): AMMONIA in the last 168 hours. Coagulation Profile: Recent Labs  Lab 05/07/20 1022  INR 1.2   Cardiac Enzymes: No results for input(s): CKTOTAL, CKMB, CKMBINDEX, TROPONINI in the last 168 hours. BNP (last 3 results) No results for input(s): PROBNP in the last 8760 hours. HbA1C: No results for input(s): HGBA1C in the last 72 hours. CBG: Recent Labs  Lab 05/07/20 2215  GLUCAP 102*   Lipid Profile: No results for input(s): CHOL, HDL, LDLCALC, TRIG, CHOLHDL, LDLDIRECT in the last 72 hours. Thyroid Function Tests: No results for input(s): TSH, T4TOTAL, FREET4, T3FREE, THYROIDAB in the last 72 hours. Anemia Panel: No results for input(s): VITAMINB12, FOLATE, FERRITIN, TIBC, IRON, RETICCTPCT in the last 72 hours. Sepsis Labs: Recent Labs  Lab 05/06/20 1624 05/07/20 1022  LATICACIDVEN 2.8* 2.3*    Recent Results (from the past 240 hour(s))  SARS Coronavirus 2 by RT PCR (hospital order, performed in [facility] hospital lab) Nasopharyngeal Nasopharyngeal Swab     Status: None   Collection Time: 05/06/20  1:41 PM   Specimen: Nasopharyngeal Swab  Result Value Ref Range Status   SARS Coronavirus 2 NEGATIVE NEGATIVE Final    Comment: (NOTE) SARS-CoV-2 target nucleic acids are NOT DETECTED.  The SARS-CoV-2 RNA is generally detectable in upper and lower respiratory specimens during the acute phase of infection. The  lowest concentration of SARS-CoV-2 viral copies this assay can detect is 250 copies / mL. A negative result does not preclude SARS-CoV-2 infection and should not be used as the sole basis for treatment or other patient management decisions.  A negative result may occur with improper specimen collection / handling, submission of specimen other than nasopharyngeal swab, presence of viral mutation(s) within the areas targeted by this assay, and inadequate number of viral copies (<250 copies / mL). A negative result must be combined with clinical observations, patient history, and epidemiological information.  Fact Sheet for Patients:   [facility]  Fact Sheet for Healthcare Providers: 07/07/20  This test is not yet approved or  cleared by the  [place] [employer] and has been authorized for detection and/or diagnosis of SARS-CoV-2 by FDA under an TEFL teacher (EUA).  This EUA will remain in effect (meaning this test can be used) for the duration of the COVID-19 declaration under Section 564(b)(1) of the Act, 21 U.S.C. section 360bbb-3(b)(1), unless the authorization is terminated or revoked sooner.  Performed at [facility], [address], [place], [place] [address]          Radiology Studies: DG Chest Port 1 View  Result Date: 05/07/2020 CLINICAL DATA:  Shortness of breath. EXAM: PORTABLE CHEST 1 VIEW COMPARISON:  November 07, 2019. FINDINGS: The heart size and mediastinal contours are within normal limits. Both lungs are clear. No pneumothorax or pleural effusion is noted. Nasogastric tube tip is seen at the gastroesophageal junction. The visualized skeletal structures are unremarkable. IMPRESSION: No active disease. Nasogastric tube tip seen at gastroesophageal junction. Advancement is recommended. Electronically Signed   By: Lupita Raider M.D.   On: 05/07/2020 09:26   DG Abd Portable  1V  Result Date: 05/07/2020 CLINICAL DATA:  Nasogastric tube placement. EXAM: PORTABLE ABDOMEN - 1 VIEW COMPARISON:  Same day. FINDINGS: The bowel gas pattern is normal. Nasogastric tube tip seen in proximal stomach. No radio-opaque calculi or other significant radiographic abnormality are seen. IMPRESSION: Nasogastric tube tip seen in proximal stomach. Electronically Signed   By: Lupita Raider M.D.   On: 05/07/2020 14:22   DG Abd Portable 1V  Result Date: 05/07/2020 CLINICAL DATA:  Small bowel obstruction, NG tube EXAM: PORTABLE ABDOMEN - 1 VIEW COMPARISON:  None. FINDINGS: NG tube tip is in the distal esophagus. Large stool burden within the colon. No free air organomegaly. IMPRESSION: NG tube tip in the distal esophagus. Electronically Signed   By: Charlett Nose M.D.   On: 05/07/2020 09:25   DG BE (COLON)W SINGLE CM (SOL OR THIN BA)  Result Date: 05/08/2020 CLINICAL DATA:  Clinical constipation. Abdominal pain. CT demonstrating colonic distension to the level of a transition point just beyond the splenic flexure. Prior colonoscopy demonstrating an area of inflamed mucosa within the distal transverse colon (negative biopsy). EXAM: WATER SOLUBLE CONTRAST ENEMA TECHNIQUE: Single-contrast enema performed with limitations secondary to patient immobility and clinical status. FLUOROSCOPY TIME:  Fluoroscopy Time:  4 minutes and 30 seconds Radiation Exposure Index (if provided by the fluoroscopic device): Not applicable. Number of Acquired Spot Images: 0 COMPARISON:  Plain film 05/07/2020. CT 05/06/2020. Colonoscopy from 10/05/2019. FINDINGS: Preprocedure scout film demonstrates a large amount of stool throughout the colon. Contrast and stool within the ascending colon. Nasogastric tube terminating over the gastric body. Rectal contrast administration demonstrates large amount of stool throughout the colon. No dominant stricture identified within the descending or sigmoid colon. Scattered colonic diverticula.  Contrast partially opacifies to the level of the mid transverse colon, which is primarily stool-filled and moderately dilated. Due to patient discomfort, the more proximal colon could not be opacified. IMPRESSION: Limited, single-contrast exam, as detailed above. Contrast opacifies to the level of the mid transverse colon, which is primarily stool-filled. Given above limitations, no dominant stricture identified. Electronically Signed   By: Jeronimo Greaves M.D.   On: 05/08/2020 09:48        Scheduled Meds: . heparin injection (subcutaneous)  5,000 Units Subcutaneous Q8H  . hydrocortisone sod succinate (SOLU-CORTEF) inj  50 mg Intravenous Q8H  . pantoprazole (PROTONIX) IV  40 mg Intravenous Q12H  . polyethylene glycol  17 g Oral Daily  .  sodium phosphate  1 enema Rectal Once   Continuous Infusions: . cefTRIAXone (ROCEPHIN)  IV 2 g (05/08/20 1124)  . dextrose 5 % and 0.45 % NaCl with KCl 20 mEq/L Stopped (05/09/20 0113)  . metronidazole Stopped (05/09/20 0206)          Glade Lloyd, MD [facility] 05/09/2020, 9:03 AM

## 2020-05-09 NOTE — Progress Notes (Signed)
[facility] Gastroenterology Progress Note  Michelle Graves 69 y.o. [birth year]  CC: Abdominal pain, constipation, possible colonic stricture   Subjective: Patient seen and examined at bedside.  Completed half of the colonoscopy prep.  Denied any nausea or vomiting.  Had multiple bowel movements overnight and this morning.  Abdominal pain has improved.  ROS : Negative for chest pain and shortness of breath.   Objective: Vital signs in last 24 hours: Vitals:   05/08/20 2222 05/09/20 0457  BP: (!) 143/101 (!) 136/95  Pulse: (!) 106 (!) 105  Resp: 17 16  Temp: 99.5 F (37.5 C) 99.1 F (37.3 C)  SpO2: 97% 98%    Physical Exam:  General:  Alert, cooperative, no distress, appears stated age, NG tube in place  Head:  Normocephalic, without obvious abnormality, atraumatic  Eyes:  , EOM's intact,   Lungs:   Clear to auscultation bilaterally, respirations unlabored  Heart:  Regular rate and rhythm, S1, S2 normal  Abdomen:   Soft, non-tender, bowel sounds present, no peritoneal signs  Psych  mood and affect normal       Lab Results: Recent Labs    05/08/20 0215 05/09/20 0407  NA 142 141  K 3.8 3.8  CL 108 109  CO2 20* 21*  GLUCOSE 130* 136*  BUN 23 20  CREATININE 1.43* 1.22*  CALCIUM 9.4 8.8*  MG 2.1 2.0   Recent Labs    05/08/20 0215 05/09/20 0407  AST 23 18  ALT 14 14  ALKPHOS 86 69  BILITOT 0.7 0.3  PROT 6.8 5.8*  ALBUMIN 3.5 2.9*   Recent Labs    05/08/20 0215 05/09/20 0407  WBC 14.0* 13.1*  NEUTROABS 12.3* 10.5*  HGB 10.8* 9.6*  HCT 33.5* 29.8*  MCV 94.9 95.2  PLT 246 210   Recent Labs    05/07/20 1022  LABPROT 14.2  INR 1.2      Assessment/Plan: Abdominal pain with constipation.  No bowel movement for last 15 days.  CT scan showed colonic obstruction with transition point at splenic flexure.  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 Patient had 2 attempted colonoscopy here at [facility] and 1 flex sig at [facility] in last 1 year.  It showed  ischemic/inflammatory changes in the descending and transverse colon.  -Coffee-ground emesis.  Resolved.  -CT scan concerning for possible pneumatosis on the right colon.  Recommendations ------------------------- -Plan for colonoscopy today.  Hopefully able to advance scope beyond the splenic flexure. -Consider removing NG tube after colonoscopy. -Further plan based on colonoscopy findings.  Discussed with Dr. Matthias Hughs   Risks (bleeding, infection, bowel perforation that could require surgery, sedation-related changes in cardiopulmonary systems), benefits (identification and possible treatment of source of symptoms, exclusion of certain causes of symptoms), and alternatives (watchful waiting, radiographic imaging studies, empiric medical treatment)  were explained to patient/family in detail and patient wishes to proceed.     Kathi Der MD, FACP 05/09/2020, 9:59 AM  Contact #  [phone]

## 2020-05-09 NOTE — Progress Notes (Signed)
Attempted colonoscopy was unsuccessful because of large amount of opaque liquid stool and residual solid stool debris, interfering with visualization.  Our plan is to give the patient further prep today and reattempt colonoscopy tomorrow morning.  Florencia Reasons, M.D. Pager [phone] If no answer or after 5 PM call [phone]

## 2020-05-09 NOTE — Progress Notes (Signed)
Occupational Therapy Treatment Patient Details Name: Michelle Graves MRN: [id number] DOB: 07-12-1951 Today's Date: 05/09/2020    History of present illness 69 yo admitted with emesis and constipation with bowel obstruction. PMHx: RA, HTN, CKD, depression, colitis, CVA   OT comments  Pt progressing well. Pt alert and oriented. Pt following all multi step commands and aware of situation. Pt increasing functional today with ADL and mobility. Pt performing light grooming and toilet hygiene with supervisionA to minguardA. Pt ambulatory in room with and without AD with mostly supervisionA to minguardA. Pt appears to have increased cognition today. Pt would benefit from continued OT for higher level ADL tasks. OT following acutely.    Follow Up Recommendations  Home health OT;Supervision - Intermittent    Equipment Recommendations  None recommended by OT    Recommendations for Other Services      Precautions / Restrictions Precautions Precautions: Fall;Other (comment) Precaution Comments: NGT Restrictions Weight Bearing Restrictions: No       Mobility Bed Mobility Overal bed mobility: Needs Assistance Bed Mobility: Supine to Sit     Supine to sit: Supervision        Transfers Overall transfer level: Needs assistance Equipment used: Rolling walker (2 wheeled);None Transfers: Sit to/from Stand Sit to Stand: Supervision         General transfer comment: supervisionA overall for power up    Balance Overall balance assessment: Needs assistance;History of Falls   Sitting balance-[name]: Good     Standing balance support: Single extremity supported Standing balance-[name]: Fair Standing balance comment: standing at sink for ADL task                           ADL either performed or assessed with clinical judgement   ADL Overall ADL's : Needs assistance/impaired     Grooming: Supervision/safety;Standing Grooming Details (indicate cue type and  reason): pt standing at sink for ADL tasks with supervisionA only.              Lower Body Dressing: Min guard;Sitting/lateral leans;Sit to/from stand   Toilet Transfer: Min guard;Ambulation;RW;BSC   Toileting- Architect and Hygiene: Supervision/safety;Sit to/from stand Toileting - Clothing Manipulation Details (indicate cue type and reason): standing for task     Functional mobility during ADLs: Min guard;Rolling walker;Cueing for safety General ADL Comments: Pt increasing functional today with ADL and mobility. Pt performing light grooming and toilet hygiene with supervisionA to minguardA. Pt ambulatory in room with and without AD. Pt appears to have increased cognition today.     Vision   Vision Assessment?: No apparent visual deficits   Perception     Praxis      Cognition Arousal/Alertness: Awake/alert Behavior During Therapy: WFL for tasks assessed/performed Overall Cognitive Status: Within Functional Limits for tasks assessed                                 General Comments: pt alert and oriented. Pt following all multi step commands and aware of situation.        Exercises     Shoulder Instructions       General Comments VSS    Pertinent Vitals/ Pain       Pain Assessment: No/denies pain  Home Living  Prior Functioning/Environment              Frequency  Min 2X/week        Progress Toward Goals  OT Goals(current goals can now be found in the care plan section)  Progress towards OT goals: Progressing toward goals  Acute Rehab OT Goals Patient Stated Goal: return home with my daughter, feel better OT Goal Formulation: With patient Time For Goal Achievement: 05/21/20 Potential to Achieve Goals: Good ADL Goals Pt Will Perform Grooming: with set-up;standing Pt Will Perform Lower Body Dressing: with supervision;sitting/lateral leans;sit to/from stand Pt  Will Transfer to Toilet: with supervision;ambulating Additional ADL Goal #1: Pt will increase to supervisionA for OOB ADL tasks to increase independence. Additional ADL Goal #2: Pt will follow multi-step commands with no verbal cues to complete higher level cognitive tasks.  Plan Discharge plan remains appropriate    Co-evaluation                 AM-PAC OT "6 Clicks" Daily Activity     Outcome Measure   Help from another person eating meals?: None Help from another person taking care of personal grooming?: A Little Help from another person toileting, which includes using toliet, bedpan, or urinal?: A Little Help from another person bathing (including washing, rinsing, drying)?: A Little Help from another person to put on and taking off regular upper body clothing?: None Help from another person to put on and taking off regular lower body clothing?: A Little 6 Click Score: 20    End of Session Equipment Utilized During Treatment: Rolling walker  OT Visit Diagnosis: Unsteadiness on feet (R26.81);Muscle weakness (generalized) (M62.81)   Activity Tolerance Patient tolerated treatment well   Patient Left in chair;with call bell/phone within reach;with chair alarm set   Nurse Communication Mobility status        Time: 6384-6659 OT Time Calculation (min): 38 min  Charges: OT General Charges $OT Visit: 1 Visit OT Treatments $Self Care/Home Management : 23-37 mins $Therapeutic Activity: 8-22 mins  Michelle Graves, OTR/L Acute Rehabilitation Services Pager: [phone] Office: [phone]    Michelle Graves C 05/09/2020, 5:54 PM

## 2020-05-09 NOTE — Progress Notes (Signed)
Pt walking to the bathroom with standby assist, HR went up to 150's, pt asymptomatic. Will monitor pt.

## 2020-05-09 NOTE — Anesthesia Procedure Notes (Addendum)
Procedure Name: MAC Date/Time: 05/09/2020 12:21 PM Performed by: Jenne Campus, CRNA Pre-anesthesia Checklist: Patient identified, Emergency Drugs available, Suction available and Patient being monitored Oxygen Delivery Method: Simple face mask

## 2020-05-09 NOTE — Transfer of Care (Signed)
Immediate Anesthesia Transfer of Care Note  Patient: Michelle Graves  Procedure(s) Performed: ABORTED COLONOSCOPY  Patient Location: PACU  Anesthesia Type:MAC  Level of Consciousness: oriented, drowsy and patient cooperative  Airway & Oxygen Therapy: Patient Spontanous Breathing and Patient connected to face mask oxygen  Post-op Assessment: Report given to RN and Post -op Vital signs reviewed and stable  Post vital signs: Reviewed  Last Vitals:  Vitals Value Taken Time  BP 142/86 05/09/20 1245  Temp 36.8 C 05/09/20 1245  Pulse 83 05/09/20 1249  Resp 15 05/09/20 1249  SpO2 100 % 05/09/20 1249  Vitals shown include unvalidated device data.  Last Pain:  Vitals:   05/09/20 1245  TempSrc: Oral  PainSc: 0-No pain      Patients Stated Pain Goal: 2 (79/02/40 9735)  Complications: No complications documented.

## 2020-05-09 NOTE — Anesthesia Postprocedure Evaluation (Signed)
Anesthesia Post Note  Patient: Michelle Graves  Procedure(s) Performed: ABORTED COLONOSCOPY     Patient location during evaluation: PACU Anesthesia Type: MAC Level of consciousness: awake and alert Pain management: pain level controlled Vital Signs Assessment: post-procedure vital signs reviewed and stable Respiratory status: spontaneous breathing, nonlabored ventilation, respiratory function stable and patient connected to nasal cannula oxygen Cardiovascular status: stable and blood pressure returned to baseline Postop Assessment: no apparent nausea or vomiting Anesthetic complications: no   No complications documented.  Last Vitals:  Vitals:   05/09/20 1306 05/09/20 1419  BP: (!) 147/91 (!) 154/99  Pulse: 86 93  Resp: 13 15  Temp:  36.7 C  SpO2: 99% 100%    Last Pain:  Vitals:   05/09/20 1419  TempSrc: Oral  PainSc:                  Fortunato Nordin DAVID

## 2020-05-10 ENCOUNTER — Inpatient Hospital Stay (HOSPITAL_COMMUNITY): Payer: Medicare Other | Admitting: Anesthesiology

## 2020-05-10 ENCOUNTER — Inpatient Hospital Stay (HOSPITAL_COMMUNITY): Payer: Medicare Other

## 2020-05-10 ENCOUNTER — Encounter (HOSPITAL_COMMUNITY): Payer: Self-pay | Admitting: Internal Medicine

## 2020-05-10 ENCOUNTER — Encounter (HOSPITAL_COMMUNITY)
Admission: EM | Disposition: A | Discharge status: Home / Self Care | Payer: Self-pay | Source: Home / Self Care | Attending: Internal Medicine

## 2020-05-10 DIAGNOSIS — I471 Supraventricular tachycardia: Secondary | ICD-10-CM

## 2020-05-10 DIAGNOSIS — K529 Noninfective gastroenteritis and colitis, unspecified: Secondary | ICD-10-CM

## 2020-05-10 HISTORY — PX: COLONOSCOPY WITH PROPOFOL: SHX5780

## 2020-05-10 HISTORY — PX: BIOPSY: SHX5522

## 2020-05-10 LAB — ECHOCARDIOGRAM COMPLETE
Area-P 1/2: 3.72 cm2
Calc EF: 63.3 %
Height: 66 in
MV M vel: 5.2 m/s
MV Peak grad: 108.2 mmHg
S' Lateral: 2.9 cm
Single Plane A2C EF: 64.9 %
Single Plane A4C EF: 60.8 %
Weight: 2719.59 oz

## 2020-05-10 LAB — CBC WITH DIFFERENTIAL/PLATELET
Abs Immature Granulocytes: 0.12 10*3/uL — ABNORMAL HIGH (ref 0.00–0.07)
Basophils Absolute: 0 10*3/uL (ref 0.0–0.1)
Basophils Relative: 0 %
Eosinophils Absolute: 0.1 10*3/uL (ref 0.0–0.5)
Eosinophils Relative: 1 %
HCT: 31 % — ABNORMAL LOW (ref 36.0–46.0)
Hemoglobin: 9.9 g/dL — ABNORMAL LOW (ref 12.0–15.0)
Immature Granulocytes: 1 %
Lymphocytes Relative: 28 %
Lymphs Abs: 3.3 10*3/uL (ref 0.7–4.0)
MCH: 30.7 pg (ref 26.0–34.0)
MCHC: 31.9 g/dL (ref 30.0–36.0)
MCV: 96.3 fL (ref 80.0–100.0)
Monocytes Absolute: 1.1 10*3/uL — ABNORMAL HIGH (ref 0.1–1.0)
Monocytes Relative: 9 %
Neutro Abs: 7 10*3/uL (ref 1.7–7.7)
Neutrophils Relative %: 61 %
Platelets: 225 10*3/uL (ref 150–400)
RBC: 3.22 MIL/uL — ABNORMAL LOW (ref 3.87–5.11)
RDW: 13.7 % (ref 11.5–15.5)
WBC: 11.7 10*3/uL — ABNORMAL HIGH (ref 4.0–10.5)
nRBC: 0 % (ref 0.0–0.2)

## 2020-05-10 LAB — COMPREHENSIVE METABOLIC PANEL
ALT: 13 U/L (ref 0–44)
AST: 18 U/L (ref 15–41)
Albumin: 3.1 g/dL — ABNORMAL LOW (ref 3.5–5.0)
Alkaline Phosphatase: 67 U/L (ref 38–126)
Anion gap: 9 (ref 5–15)
BUN: 12 mg/dL (ref 8–23)
CO2: 21 mmol/L — ABNORMAL LOW (ref 22–32)
Calcium: 8.5 mg/dL — ABNORMAL LOW (ref 8.9–10.3)
Chloride: 110 mmol/L (ref 98–111)
Creatinine, Ser: 1.18 mg/dL — ABNORMAL HIGH (ref 0.44–1.00)
GFR calc Af Amer: 55 mL/min — ABNORMAL LOW (ref >60)
GFR calc non Af Amer: 48 mL/min — ABNORMAL LOW (ref >60)
Glucose, Bld: 112 mg/dL — ABNORMAL HIGH (ref 70–99)
Potassium: 2.9 mmol/L — ABNORMAL LOW (ref 3.5–5.1)
Sodium: 140 mmol/L (ref 135–145)
Total Bilirubin: 0.5 mg/dL (ref 0.3–1.2)
Total Protein: 5.7 g/dL — ABNORMAL LOW (ref 6.5–8.1)

## 2020-05-10 LAB — TSH: TSH: 1.646 u[IU]/mL (ref 0.350–4.500)

## 2020-05-10 LAB — MAGNESIUM: Magnesium: 1.6 mg/dL — ABNORMAL LOW (ref 1.7–2.4)

## 2020-05-10 SURGERY — COLONOSCOPY WITH PROPOFOL
Anesthesia: Monitor Anesthesia Care

## 2020-05-10 MED ORDER — POTASSIUM CHLORIDE 10 MEQ/100ML IV SOLN: 10.0000 meq | INTRAVENOUS | Status: DC

## 2020-05-10 MED ORDER — PROPOFOL 500 MG/50ML IV EMUL
INTRAVENOUS | Status: DC | PRN
  Administered 2020-05-10: 75 ug/kg/min via INTRAVENOUS

## 2020-05-10 MED ORDER — POLYETHYLENE GLYCOL 3350 17 G PO PACK
17.0000 g | PACK | Freq: Two times a day (BID) | ORAL | Status: DC
  Administered 2020-05-10 – 2020-05-11 (×2): 17 g via ORAL
  Filled 2020-05-10 (×2): qty 1

## 2020-05-10 MED ORDER — METOPROLOL TARTRATE 5 MG/5ML IV SOLN: 5.0000 mg | Freq: Once | INTRAVENOUS | Status: DC

## 2020-05-10 MED ORDER — DILTIAZEM HCL ER COATED BEADS 180 MG PO CP24
180.0000 mg | ORAL_CAPSULE | Freq: Every day | ORAL | Status: DC
  Administered 2020-05-10 – 2020-05-11 (×2): 180 mg via ORAL
  Filled 2020-05-10 (×2): qty 1

## 2020-05-10 MED ORDER — HYDROCORTISONE NA SUCCINATE PF 100 MG IJ SOLR
25.0000 mg | Freq: Two times a day (BID) | INTRAMUSCULAR | Status: DC
  Administered 2020-05-10 – 2020-05-11 (×2): 25 mg via INTRAVENOUS
  Filled 2020-05-10 (×2): qty 2

## 2020-05-10 MED ORDER — PROPOFOL 10 MG/ML IV BOLUS
INTRAVENOUS | Status: DC | PRN
  Administered 2020-05-10: 40 mg via INTRAVENOUS

## 2020-05-10 MED ORDER — ENSURE ENLIVE PO LIQD
237.0000 mL | Freq: Three times a day (TID) | ORAL | Status: DC
  Administered 2020-05-10 – 2020-05-11 (×3): 237 mL via ORAL

## 2020-05-10 MED ORDER — LABETALOL HCL 5 MG/ML IV SOLN
5.0000 mg | Freq: Once | INTRAVENOUS | Status: AC
  Administered 2020-05-10: 5 mg via INTRAVENOUS

## 2020-05-10 MED ORDER — METOPROLOL TARTRATE 25 MG PO TABS
25.0000 mg | ORAL_TABLET | Freq: Two times a day (BID) | ORAL | Status: DC
  Administered 2020-05-10 – 2020-05-11 (×2): 25 mg via ORAL
  Filled 2020-05-10 (×2): qty 1

## 2020-05-10 MED ORDER — LACTATED RINGERS IV SOLN: INTRAVENOUS | Status: DC | PRN

## 2020-05-10 MED ORDER — MAGNESIUM SULFATE 2 GM/50ML IV SOLN
2.0000 g | Freq: Once | INTRAVENOUS | Status: AC
  Administered 2020-05-10: 2 g via INTRAVENOUS
  Filled 2020-05-10: qty 50

## 2020-05-10 MED ORDER — DOCUSATE SODIUM 100 MG PO CAPS
100.0000 mg | ORAL_CAPSULE | Freq: Two times a day (BID) | ORAL | Status: DC
  Administered 2020-05-10 – 2020-05-11 (×2): 100 mg via ORAL
  Filled 2020-05-10 (×2): qty 1

## 2020-05-10 MED ORDER — POTASSIUM CHLORIDE CRYS ER 20 MEQ PO TBCR
40.0000 meq | EXTENDED_RELEASE_TABLET | ORAL | Status: AC
  Administered 2020-05-10 (×2): 40 meq via ORAL
  Filled 2020-05-10 (×2): qty 2

## 2020-05-10 MED ORDER — LABETALOL HCL 5 MG/ML IV SOLN
INTRAVENOUS | Status: AC
  Filled 2020-05-10: qty 4

## 2020-05-10 SURGICAL SUPPLY — 22 items

## 2020-05-10 NOTE — Consult Note (Signed)
Referring Physician: Glade Lloyd, MD  Michelle Graves is an 69 y.o. female.                       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 She had episodes of SVT and sinus tachycardia. She denies chest pain. She is tolerating oral feedings and says I am ready to go home. She may need partial colectomy.  Her echocardiogram shows normal LV systolic function with mildly dilated LA and mild MR, TR and PI.  She has mildly elevated BNP, low potassium and magnesium levels and mild hyperglycemia. Her blood group is O positive.  Past Medical History:  Diagnosis Date  . Acute on chronic kidney failure (HCC)   . Anemia   . Arthritis   . Chest pain   . Diverticulitis   . Hypertension   . Stroke (HCC)   . UTI (urinary tract infection) 10/2019      Past Surgical History:  Procedure Laterality Date  . BIOPSY  04/01/2019   Procedure: BIOPSY;  Surgeon: Kathi Der, MD;  Location: [facility] ENDOSCOPY;  Service: Gastroenterology;;  . BIOPSY  10/05/2019   Procedure: BIOPSY;  Surgeon: Kathi Der, MD;  Location: [facility] ENDOSCOPY;  Service: Gastroenterology;;  . COLONOSCOPY WITH PROPOFOL N/A 04/01/2019   Procedure: COLONOSCOPY WITH PROPOFOL;  Surgeon: Kathi Der, MD;  Location: [facility] ENDOSCOPY;  Service: Gastroenterology;  Laterality: N/A;  . COLONOSCOPY WITH PROPOFOL N/A 10/05/2019   Procedure: COLONOSCOPY WITH PROPOFOL;  Surgeon: Kathi Der, MD;  Location: [facility] ENDOSCOPY;  Service: Gastroenterology;  Laterality: N/A;  . ESOPHAGOGASTRODUODENOSCOPY (EGD) WITH PROPOFOL N/A 04/01/2019   Procedure: ESOPHAGOGASTRODUODENOSCOPY (EGD) WITH PROPOFOL;  Surgeon: Kathi Der, MD;  Location: [facility] ENDOSCOPY;  Service: Gastroenterology;  Laterality: N/A;  . ESOPHAGOGASTRODUODENOSCOPY (EGD) WITH PROPOFOL N/A 10/05/2019   Procedure: ESOPHAGOGASTRODUODENOSCOPY (EGD) WITH PROPOFOL;   Surgeon: Kathi Der, MD;  Location: [facility] ENDOSCOPY;  Service: Gastroenterology;  Laterality: N/A;  . TONSILLECTOMY      Family History  Problem Relation Age of Onset  . Hypertension Mother   . Diabetes Mother   . CAD Father        died of MI at age 19  . Hypertension Father   . Diabetes Sister   . Diabetes Sister   . Kidney disease Neg Hx    Social History:  reports that she has never smoked. She has never used smokeless tobacco. She reports current alcohol use of about 21.0 standard drinks of alcohol per week. She reports that she does not use drugs.  Allergies: No Known Allergies  Medications Prior to Admission  Medication Sig Dispense Refill  . amLODipine (NORVASC) 10 MG tablet Take 10 mg by mouth daily.    Marland Kitchen doxepin (SINEQUAN) 50 MG capsule Take 10 mg by mouth daily.    . DULoxetine (CYMBALTA) 60 MG capsule Take 60 mg by mouth at bedtime.     . feeding supplement, ENSURE ENLIVE, (ENSURE ENLIVE) LIQD Take 237 mLs by mouth 3 (three) times daily between meals. 237 mL 12  . HYDROcodone-acetaminophen (NORCO/VICODIN) 5-325 MG tablet Take 1 tablet by mouth 3 (three) times daily as needed for moderate pain.     . hydroxychloroquine (PLAQUENIL) 200 MG tablet Take 200 mg by mouth 2 (two) times daily.   0  . lisinopril (ZESTRIL) 40 MG tablet Take 40 mg by mouth daily.    . Multiple  Vitamin (MULTIVITAMIN WITH MINERALS) TABS tablet Take 1 tablet by mouth daily. 30 tablet 2  . MYRBETRIQ 25 MG TB24 tablet Take 25 mg by mouth daily.    Marland Kitchen omeprazole (PRILOSEC) 20 MG capsule Take 20 mg by mouth daily as needed (acid reflux).     . ondansetron (ZOFRAN) 4 MG tablet Take 1 tablet (4 mg total) by mouth every 6 (six) hours as needed for nausea. (Patient taking differently: Take 4 mg by mouth every 6 (six) hours as needed for nausea or vomiting. ) 20 tablet 0  . prazosin (MINIPRESS) 1 MG capsule Take 1 mg by mouth at bedtime.    . predniSONE (DELTASONE) 10 MG tablet Take 5 mg by mouth daily.    .  QUEtiapine (SEROQUEL) 25 MG tablet Take 25 mg by mouth daily.    . solifenacin (VESICARE) 5 MG tablet Take 5 mg by mouth daily.    Marland Kitchen aspirin EC 81 MG EC tablet Take 1 tablet (81 mg total) by mouth daily. (Patient not taking: Reported on 05/06/2020) 30 tablet 0  . chlorproMAZINE (THORAZINE) 25 MG tablet Take 1 tablet (25 mg total) by mouth 3 (three) times daily as needed for hiccoughs. (Patient not taking: Reported on 05/06/2020) 6 tablet 0  . clopidogrel (PLAVIX) 75 MG tablet Take 1 tablet (75 mg total) by mouth daily. (Patient not taking: Reported on 05/06/2020) 30 tablet 0  . colestipol (COLESTID) 1 g tablet Take 1 tablet (1 g total) by mouth 2 (two) times daily. (Patient not taking: Reported on 05/06/2020) 30 tablet 0  . docusate sodium (COLACE) 100 MG capsule Take 1 capsule (100 mg total) by mouth 2 (two) times daily. (Patient not taking: Reported on 05/06/2020) 10 capsule 0  . megestrol (MEGACE) 400 MG/10ML suspension Take 10 mLs (400 mg total) by mouth 2 (two) times daily. (Patient not taking: Reported on 05/06/2020) 240 mL 0  . tiZANidine (ZANAFLEX) 4 MG tablet Take 4 mg by mouth 2 (two) times daily. (Patient not taking: Reported on 05/06/2020)      Results for orders placed or performed during the hospital encounter of 05/06/20 (from the past 48 hour(s))  CBC with Differential/Platelet     Status: Abnormal   Collection Time: 05/09/20  4:07 AM  Result Value Ref Range   WBC 13.1 (H) 4.0 - 10.5 K/uL   RBC 3.13 (L) 3.87 - 5.11 MIL/uL   Hemoglobin 9.6 (L) 12.0 - 15.0 g/dL   HCT 69.6 (L) 36 - 46 %   MCV 95.2 80.0 - 100.0 fL   MCH 30.7 26.0 - 34.0 pg   MCHC 32.2 30.0 - 36.0 g/dL   RDW 29.5 28.4 - 13.2 %   Platelets 210 150 - 400 K/uL   nRBC 0.0 0.0 - 0.2 %   Neutrophils Relative % 80 %   Neutro Abs 10.5 (H) 1.7 - 7.7 K/uL   Lymphocytes Relative 11 %   Lymphs Abs 1.5 0.7 - 4.0 K/uL   Monocytes Relative 8 %   Monocytes Absolute 1.0 0 - 1 K/uL   Eosinophils Relative 0 %   Eosinophils Absolute  0.0 0 - 0 K/uL   Basophils Relative 0 %   Basophils Absolute 0.0 0 - 0 K/uL   Immature Granulocytes 1 %   Abs Immature Granulocytes 0.10 (H) 0.00 - 0.07 K/uL    Comment: Performed at [facility], [address] [place], [place] [address]  Comprehensive metabolic panel     Status: Abnormal  Collection Time: 05/09/20  4:07 AM  Result Value Ref Range   Sodium 141 135 - 145 mmol/L   Potassium 3.8 3.5 - 5.1 mmol/L   Chloride 109 98 - 111 mmol/L   CO2 21 (L) 22 - 32 mmol/L   Glucose, Bld 136 (H) 70 - 99 mg/dL    Comment: Glucose reference range applies only to samples taken after fasting for at least 8 hours.   BUN 20 8 - 23 mg/dL   Creatinine, Ser 4.09 (H) 0.44 - 1.00 mg/dL   Calcium 8.8 (L) 8.9 - 10.3 mg/dL   Total Protein 5.8 (L) 6.5 - 8.1 g/dL   Albumin 2.9 (L) 3.5 - 5.0 g/dL   AST 18 15 - 41 U/L   ALT 14 0 - 44 U/L   Alkaline Phosphatase 69 38 - 126 U/L   Total Bilirubin 0.3 0.3 - 1.2 mg/dL   GFR calc non Af Amer 46 (L) >60 mL/min   GFR calc Af Amer 53 (L) >60 mL/min   Anion gap 11 5 - 15    Comment: Performed at [facility], [address], [place], [place] [address]  Magnesium     Status: None   Collection Time: 05/09/20  4:07 AM  Result Value Ref Range   Magnesium 2.0 1.7 - 2.4 mg/dL    Comment: Performed at [facility], [address] [place], [place] [address]  Brain natriuretic peptide     Status: Abnormal   Collection Time: 05/09/20  4:07 AM  Result Value Ref Range   B Natriuretic Peptide 173.4 (H) 0.0 - 100.0 pg/mL    Comment: Performed at [facility], [address], [place], [place] [address]  CBC with Differential/Platelet     Status: Abnormal   Collection Time: 05/10/20  4:08 AM  Result Value Ref Range   WBC 11.7 (H) 4.0 - 10.5 K/uL   RBC 3.22 (L) 3.87 - 5.11 MIL/uL   Hemoglobin 9.9 (L) 12.0 - 15.0 g/dL   HCT 08.6 (L) 36 - 46 %   MCV 96.3 80.0 - 100.0 fL   MCH 30.7 26.0 - 34.0 pg   MCHC 31.9 30.0 - 36.0 g/dL   RDW 57.8 46.9 -  62.9 %   Platelets 225 150 - 400 K/uL   nRBC 0.0 0.0 - 0.2 %   Neutrophils Relative % 61 %   Neutro Abs 7.0 1.7 - 7.7 K/uL   Lymphocytes Relative 28 %   Lymphs Abs 3.3 0.7 - 4.0 K/uL   Monocytes Relative 9 %   Monocytes Absolute 1.1 (H) 0 - 1 K/uL   Eosinophils Relative 1 %   Eosinophils Absolute 0.1 0 - 0 K/uL   Basophils Relative 0 %   Basophils Absolute 0.0 0 - 0 K/uL   Immature Granulocytes 1 %   Abs Immature Granulocytes 0.12 (H) 0.00 - 0.07 K/uL    Comment: Performed at [facility], [address], [place], [place] [address]  Comprehensive metabolic panel     Status: Abnormal   Collection Time: 05/10/20  4:08 AM  Result Value Ref Range   Sodium 140 135 - 145 mmol/L   Potassium 2.9 (L) 3.5 - 5.1 mmol/L   Chloride 110 98 - 111 mmol/L   CO2 21 (L) 22 - 32 mmol/L   Glucose, Bld 112 (H) 70 - 99 mg/dL    Comment: Glucose reference range applies only to samples taken after fasting for at least 8  hours.   BUN 12 8 - 23 mg/dL   Creatinine, Ser 3.71 (H) 0.44 - 1.00 mg/dL   Calcium 8.5 (L) 8.9 - 10.3 mg/dL   Total Protein 5.7 (L) 6.5 - 8.1 g/dL   Albumin 3.1 (L) 3.5 - 5.0 g/dL   AST 18 15 - 41 U/L   ALT 13 0 - 44 U/L   Alkaline Phosphatase 67 38 - 126 U/L   Total Bilirubin 0.5 0.3 - 1.2 mg/dL   GFR calc non Af Amer 48 (L) >60 mL/min   GFR calc Af Amer 55 (L) >60 mL/min   Anion gap 9 5 - 15    Comment: Performed at [facility], [address] [place], [place] [address]  Magnesium     Status: Abnormal   Collection Time: 05/10/20  4:08 AM  Result Value Ref Range   Magnesium 1.6 (L) 1.7 - 2.4 mg/dL    Comment: Performed at [facility], [address], [place], [place] [address]  TSH     Status: None   Collection Time: 05/10/20  5:16 PM  Result Value Ref Range   TSH 1.646 0.350 - 4.500 uIU/mL    Comment: Performed by a 3rd Generation assay with a functional sensitivity of <=0.01 uIU/mL. Performed at [facility], [address] [place], [place]  [address]    ECHOCARDIOGRAM COMPLETE  Result Date: 05/10/2020    ECHOCARDIOGRAM REPORT   Patient Name:   Michelle Graves Date of Exam: 05/10/2020 Medical Rec #:  [id number]         Height:       66.0 in Accession #:    [id number]        Weight:       170.0 lb Date of Birth:  August 25, 1951          BSA:          1.866 m Patient Age:    69 years          BP:           158/103 mmHg Patient Gender: F                 HR:           86 bpm. Exam Location:  Inpatient Procedure: 2D Echo, Cardiac Doppler and Color Doppler Indications:     Mitral valve insufficiency 424.0 / 134.0  History:         Patient has prior history of Echocardiogram examinations, most                  recent 03/26/2020. Stroke, Arrythmias:Atrial Fibrillation; Risk                  Factors:Hypertension and Non-Smoker.  Sonographer:     Renella Cunas RDCS Referring Phys:  [id number] Orpah Cobb Diagnosing Phys: Orpah Cobb MD IMPRESSIONS  1. Left ventricular ejection fraction, by estimation, is 60 to 65%. The left ventricle has normal function. The left ventricle has no regional wall motion abnormalities. Left ventricular diastolic parameters are consistent with Grade I diastolic dysfunction (impaired relaxation).  2. Right ventricular systolic function is normal. The right ventricular size is normal. There is normal pulmonary artery systolic pressure.  3. Left atrial size was mildly dilated.  4. The mitral valve is degenerative. Mild mitral valve regurgitation.  5. The aortic valve is tricuspid. Aortic valve regurgitation is trivial. Mild aortic valve sclerosis is present, with no evidence of aortic  valve stenosis.  6. There is mild (Grade II) atheroma plaque involving the aortic root and ascending aorta.  7. The inferior vena cava is normal in size with <50% respiratory variability, suggesting right atrial pressure of 8 mmHg. FINDINGS  Left Ventricle: Left ventricular ejection fraction, by estimation, is 60 to 65%. The left ventricle has normal function. The  left ventricle has no regional wall motion abnormalities. The left ventricular internal cavity size was normal in size. There is  no left ventricular hypertrophy. Left ventricular diastolic parameters are consistent with Grade I diastolic dysfunction (impaired relaxation). Right Ventricle: The right ventricular size is normal. No increase in right ventricular wall thickness. Right ventricular systolic function is normal. There is normal pulmonary artery systolic pressure. The tricuspid regurgitant velocity is 2.54 m/s, and  with an assumed right atrial pressure of 8 mmHg, the estimated right ventricular systolic pressure is 33.8 mmHg. Left Atrium: Left atrial size was mildly dilated. Right Atrium: Right atrial size was normal in size. Pericardium: There is no evidence of pericardial effusion. Mitral Valve: The mitral valve is degenerative in appearance. There is mild thickening of the mitral valve leaflet(s). There is mild calcification of the mitral valve leaflet(s). Mild mitral annular calcification. Mild mitral valve regurgitation. Tricuspid Valve: The tricuspid valve is normal in structure. Tricuspid valve regurgitation is mild. Aortic Valve: The aortic valve is tricuspid. . There is mild thickening and mild calcification of the aortic valve. Aortic valve regurgitation is trivial. Mild aortic valve sclerosis is present, with no evidence of aortic valve stenosis. Mild aortic valve annular calcification. There is mild thickening of the aortic valve. There is mild calcification of the aortic valve. Pulmonic Valve: The pulmonic valve was grossly normal. Pulmonic valve regurgitation is mild. Aorta: The aortic root is normal in size and structure. There is mild (Grade II) atheroma plaque involving the aortic root and ascending aorta. Venous: The inferior vena cava is normal in size with less than 50% respiratory variability, suggesting right atrial pressure of 8 mmHg. IAS/Shunts: The interatrial septum was not  assessed.  LEFT VENTRICLE PLAX 2D LVIDd:         4.40 cm     Diastology LVIDs:         2.90 cm     LV e' lateral:   6.75 cm/s LV PW:         0.90 cm     LV E/e' lateral: 10.1 LV IVS:        0.90 cm     LV e' medial:    6.48 cm/s LVOT diam:     2.00 cm     LV E/e' medial:  10.5 LV SV:         66 LV SV Index:   35 LVOT Area:     3.14 cm  LV Volumes (MOD) LV vol d, MOD A2C: 91.8 ml LV vol d, MOD A4C: 92.5 ml LV vol s, MOD A2C: 32.2 ml LV vol s, MOD A4C: 36.3 ml LV SV MOD A2C:     59.6 ml LV SV MOD A4C:     92.5 ml LV SV MOD BP:      59.0 ml RIGHT VENTRICLE RV S prime:     10.40 cm/s TAPSE (M-mode): 1.7 cm LEFT ATRIUM             Index       RIGHT ATRIUM           Index LA diam:  3.50 cm 1.88 cm/m  RA Area:     10.80 cm LA Vol (A2C):   33.9 ml 18.17 ml/m RA Volume:   21.20 ml  11.36 ml/m LA Vol (A4C):   35.3 ml 18.92 ml/m LA Biplane Vol: 37.3 ml 19.99 ml/m  AORTIC VALVE LVOT Vmax:   97.00 cm/s LVOT Vmean:  73.800 cm/s LVOT VTI:    0.209 m  AORTA Ao Root diam: 3.70 cm MITRAL VALVE                TRICUSPID VALVE MV Area (PHT): 3.72 cm     TR Peak grad:   25.8 mmHg MV Decel Time: 204 msec     TR Vmax:        254.00 cm/s MR Peak grad: 108.2 mmHg MR Vmax:      520.00 cm/s   SHUNTS MV E velocity: 67.90 cm/s   Systemic VTI:  0.21 m MV A velocity: 102.00 cm/s  Systemic Diam: 2.00 cm MV E/A ratio:  0.67 Orpah Cobb MD Electronically signed by Orpah Cobb MD Signature Date/Time: 05/10/2020/2:11:56 PM    Final     Review Of Systems Constitutional: Positive fever, no chills, weight loss or gain. Eyes: No vision change, wears glasses. No discharge or pain. Ears: No hearing loss, No tinnitus. Respiratory: No asthma, COPD, pneumonias. No shortness of breath. No hemoptysis. Cardiovascular: No chest pain, positive palpitation, leg edema. Gastrointestinal: No nausea, vomiting, diarrhea, constipation. No GI bleed. No hepatitis. Genitourinary: No dysuria, hematuria, kidney stone. No incontinance. Neurological: No  headache, stroke, seizures.  Psychiatry: No psych facility admission for anxiety, depression, suicide. No detox. Skin: No rash. Musculoskeletal: Positive joint pain, no fibromyalgia. No neck pain, back pain. Lymphadenopathy: No lymphadenopathy. Hematology: No anemia or easy bruising.   Blood pressure (!) 141/97, pulse 75, temperature 98.7 F (37.1 C), temperature source Oral, resp. rate 18, height  (1.676 m), weight 77.1 kg, SpO2 99 %. Body mass index is 27.43 kg/m. General appearance: alert, cooperative, appears stated age and no distress Head: Normocephalic, atraumatic. Eyes: Brown eyes, pink conjunctiva, corneas clear. PERRL, EOM's intact. Neck: No adenopathy, no carotid bruit, no JVD, supple, symmetrical, trachea midline and thyroid not enlarged. Resp: Clear to auscultation bilaterally. Cardio: Regular rate and rhythm, S1, S2 normal, III/VI systolic murmur, no click, rub or gallop GI: Soft, non-tender; bowel sounds normal; no organomegaly. Extremities: Trace edema, no cyanosis or clubbing. Skin: Warm and dry.  Neurologic: Alert and oriented X 3, normal strength.   Assessment/Plan SVT, paroxysmal Sinus tachycardia S/P Large bowel obstruction CMV colitis Splenic flexure stricture Hypertension H/O stroke CKD, II Hyperglycemia  Start low dose B-blocker and Diltiazem for heart rate control. Potassium and magnesium supplement as needed. TSH pending. Patient agrees to decrease alcohol intake. Discontinue amlodipine and lisinopril from home med list  Time spent: Review of old records, Lab, x-rays, EKG, other cardiac tests, examination, discussion with patient over 70 minutes.  Ricki Rodriguez, MD  05/10/2020, 6:45 PM

## 2020-05-10 NOTE — Anesthesia Postprocedure Evaluation (Signed)
Anesthesia Post Note  Patient: Michelle Graves  Procedure(s) Performed: COLONOSCOPY WITH PROPOFOL (N/A ) BIOPSY     Patient location during evaluation: Endoscopy Anesthesia Type: MAC Level of consciousness: awake and alert Pain management: pain level controlled Vital Signs Assessment: post-procedure vital signs reviewed and stable Respiratory status: spontaneous breathing, nonlabored ventilation, respiratory function stable and patient connected to nasal cannula oxygen Cardiovascular status: blood pressure returned to baseline and stable Postop Assessment: no apparent nausea or vomiting Anesthetic complications: no   No complications documented.  Last Vitals:  Vitals:   05/10/20 0821 05/10/20 0852  BP: (!) 158/105 (!) 158/103  Pulse: 74 75  Resp: 17   Temp:  36.8 C  SpO2: 100% 99%    Last Pain:  Vitals:   05/10/20 0821  TempSrc:   PainSc: 0-No pain                 Barnet Glasgow

## 2020-05-10 NOTE — Progress Notes (Signed)
Physical Therapy Treatment Patient Details Name: Michelle Graves MRN: [id number] DOB: 1951-04-04 Today's Date: 05/10/2020    History of Present Illness 69 yo admitted with emesis and constipation with bowel obstruction. PMHx: RA, HTN, CKD, depression, colitis, CVA    PT Comments    Pt sitting EOB upon arrival of PT, agreeable to session with focus on maintaining mobility and final education prior to d/c. The pt was in good spirits and able to ambulate 150 ft in hallway with multiple episodes of stopping to dance, all without LOB or use of AD. The pt does ambulate with a significantly slowed speed and wide BOS, but reports this is her "normal". Although the pt would continue to benefit from continued gait training and higher-level balance training to reduce risk of falls in the future, the pt is safe to d/c home with assist of family as needed.     Follow Up Recommendations  Home health PT;Supervision/Assistance - 24 hour     Equipment Recommendations  None recommended by PT    Recommendations for Other Services       Precautions / Restrictions Precautions Precautions: Fall Restrictions Weight Bearing Restrictions: No    Mobility  Bed Mobility Overal bed mobility: Modified Independent             General bed mobility comments: pt sitting EOB upon arrival of PT  Transfers Overall transfer level: Needs assistance Equipment used: Rolling walker (2 wheeled);None Transfers: Sit to/from Stand Sit to Stand: Supervision         General transfer comment: supervision for safety, pt able to rise and lower without need for RW  Ambulation/Gait Ambulation/Gait assistance: Supervision Gait Distance (Feet): 150 Feet Assistive device: None Gait Pattern/deviations: Step-through pattern;Decreased stride length;Wide base of support Gait velocity: 0.3 m/s Gait velocity interpretation: 1.31 - 2.62 ft/sec, indicative of limited community ambulator General Gait Details: pt initially  ambulating with RW, reports she did not use at home, agreeable to try without. pt able to ambulate in hall without AD, no LOB, stopped to dance multiple times. Slow gait, but pt reports this is just her preferred gait speed       Balance Overall balance assessment: Needs assistance;History of Falls   Sitting balance-[name]: Good Sitting balance - Comments: pt able to sit EOB without support   Standing balance support: No upper extremity supported Standing balance-[name]: Fair Standing balance comment: pt able to ambulate without AD, no LOB. dancing in hall                            Cognition Arousal/Alertness: Awake/alert Behavior During Therapy: [facility] for tasks assessed/performed Overall Cognitive Status: Within Functional Limits for tasks assessed                                 General Comments: pt alert and oriented. Pt following all multi step commands and aware of situation.      Exercises      General Comments        Pertinent Vitals/Pain Pain Assessment: No/denies pain Pain Intervention(s): Limited activity within patient's tolerance;Monitored during session           PT Goals (current goals can now be found in the care plan section) Acute Rehab PT Goals Patient Stated Goal: return home with my daughter, feel better PT Goal Formulation: With patient Time For Goal Achievement: 05/21/20 Potential  to Achieve Goals: Fair Progress towards PT goals: Progressing toward goals    Frequency    Min 3X/week      PT Plan Current plan remains appropriate       AM-PAC PT "6 Clicks" Mobility   Outcome Measure  Help needed turning from your back to your side while in a flat bed without using bedrails?: None Help needed moving from lying on your back to sitting on the side of a flat bed without using bedrails?: None Help needed moving to and from a bed to a chair (including a wheelchair)?: A Little Help needed standing up from a  chair using your arms (e.g., wheelchair or bedside chair)?: A Little Help needed to walk in hospital room?: A Little Help needed climbing 3-5 steps with a railing? : A Little 6 Click Score: 20    End of Session Equipment Utilized During Treatment: Gait belt Activity Tolerance: Patient tolerated treatment well Patient left: in bed;with call bell/phone within reach (sitting EOB) Nurse Communication: Mobility status PT Visit Diagnosis: Other abnormalities of gait and mobility (R26.89);Muscle weakness (generalized) (M62.81);Difficulty in walking, not elsewhere classified (R26.2)     Time: 3570-1779 PT Time Calculation (min) (ACUTE ONLY): 17 min  Charges:  $Gait Training: 8-22 mins                     Rolm Baptise, PT, DPT   Acute Rehabilitation Department Pager #: [phone]   Gaetana Michaelis 05/10/2020, 5:39 PM

## 2020-05-10 NOTE — Progress Notes (Signed)
  Echocardiogram 2D Echocardiogram has been performed.  Michelle Graves 05/10/2020, 1:41 PM

## 2020-05-10 NOTE — Progress Notes (Signed)
[facility] Progress Note  Day of Surgery  Subjective: CC-  Feeling better. Tolerating liquids and passing flatus. Denies any abdominal pain. Hoping to go home. Colonoscopy earlier today showed stool in the entire examined colon; Congested, eroded, friable (with contact bleeding), inflamed and ulcerated mucosa in the transverse colon - biopsied; stricture at the splenic flexure  Objective: Vital signs in last 24 hours: Temp:  [97.7 F (36.5 C)-98.7 F (37.1 C)] 98.7 F (37.1 C) (07/15 1300) Pulse Rate:  [74-128] 75 (07/15 1300) Resp:  [13-18] 18 (07/15 1300) BP: (141-189)/(93-130) 141/97 (07/15 1300) SpO2:  [98 %-100 %] 99 % (07/15 1300) Last BM Date: 05/10/20  Intake/Output from previous day: 07/14 0701 - 07/15 0700 In: 3750 [I.V.:850; NG/GT:2500; IV Piggyback:400] Out: 0  Intake/Output this shift: Total I/O In: 250 [I.V.:250] Out: -   PE: Gen: Alert, NAD, pleasant HEENT: EOM's intact, pupils equal and round Pulm:  rate and effort normal Abd: Soft,minimal distension,+BS, no HSM,nontender Psych: A&Ox4  Skin: no rashes noted, warm and dry   Lab Results:  Recent Labs    05/09/20 0407 05/10/20 0408  WBC 13.1* 11.7*  HGB 9.6* 9.9*  HCT 29.8* 31.0*  PLT 210 225   BMET Recent Labs    05/09/20 0407 05/10/20 0408  NA 141 140  K 3.8 2.9*  CL 109 110  CO2 21* 21*  GLUCOSE 136* 112*  BUN 20 12  CREATININE 1.22* 1.18*  CALCIUM 8.8* 8.5*   PT/INR No results for input(s): LABPROT, INR in the last 72 hours. CMP     Component Value Date/Time   NA 140 05/10/2020 0408   K 2.9 (L) 05/10/2020 0408   CL 110 05/10/2020 0408   CO2 21 (L) 05/10/2020 0408   GLUCOSE 112 (H) 05/10/2020 0408   BUN 12 05/10/2020 0408   CREATININE 1.18 (H) 05/10/2020 0408   CALCIUM 8.5 (L) 05/10/2020 0408   PROT 5.7 (L) 05/10/2020 0408   ALBUMIN 3.1 (L) 05/10/2020 0408   AST 18 05/10/2020 0408   ALT 13 05/10/2020 0408   ALKPHOS 67 05/10/2020 0408   BILITOT 0.5  05/10/2020 0408   GFRNONAA 48 (L) 05/10/2020 0408   GFRAA 55 (L) 05/10/2020 0408   Lipase     Component Value Date/Time   LIPASE 26 05/06/2020 0814       Studies/Results: ECHOCARDIOGRAM COMPLETE  Result Date: 05/10/2020    ECHOCARDIOGRAM REPORT   Patient Name:   Michelle Graves Date of Exam: 05/10/2020 Medical Rec #:  [id number]         Height:       66.0 in Accession #:    [id number]        Weight:       170.0 lb Date of Birth:  1951-03-29          BSA:          1.866 m Patient Age:    69 years          BP:           158/103 mmHg Patient Gender: F                 HR:           86 bpm. Exam Location:  Inpatient Procedure: 2D Echo, Cardiac Doppler and Color Doppler Indications:     Mitral valve insufficiency 424.0 / 134.0  History:         Patient has prior history of Echocardiogram examinations, most  recent 03/26/2020. Stroke, Arrythmias:Atrial Fibrillation; Risk                  Factors:Hypertension and Non-Smoker.  Sonographer:     Renella Cunas RDCS Referring Phys:  [id number] Orpah Cobb Diagnosing Phys: Orpah Cobb MD IMPRESSIONS  1. Left ventricular ejection fraction, by estimation, is 60 to 65%. The left ventricle has normal function. The left ventricle has no regional wall motion abnormalities. Left ventricular diastolic parameters are consistent with Grade I diastolic dysfunction (impaired relaxation).  2. Right ventricular systolic function is normal. The right ventricular size is normal. There is normal pulmonary artery systolic pressure.  3. Left atrial size was mildly dilated.  4. The mitral valve is degenerative. Mild mitral valve regurgitation.  5. The aortic valve is tricuspid. Aortic valve regurgitation is trivial. Mild aortic valve sclerosis is present, with no evidence of aortic valve stenosis.  6. There is mild (Grade II) atheroma plaque involving the aortic root and ascending aorta.  7. The inferior vena cava is normal in size with <50% respiratory variability,  suggesting right atrial pressure of 8 mmHg. FINDINGS  Left Ventricle: Left ventricular ejection fraction, by estimation, is 60 to 65%. The left ventricle has normal function. The left ventricle has no regional wall motion abnormalities. The left ventricular internal cavity size was normal in size. There is  no left ventricular hypertrophy. Left ventricular diastolic parameters are consistent with Grade I diastolic dysfunction (impaired relaxation). Right Ventricle: The right ventricular size is normal. No increase in right ventricular wall thickness. Right ventricular systolic function is normal. There is normal pulmonary artery systolic pressure. The tricuspid regurgitant velocity is 2.54 m/s, and  with an assumed right atrial pressure of 8 mmHg, the estimated right ventricular systolic pressure is 33.8 mmHg. Left Atrium: Left atrial size was mildly dilated. Right Atrium: Right atrial size was normal in size. Pericardium: There is no evidence of pericardial effusion. Mitral Valve: The mitral valve is degenerative in appearance. There is mild thickening of the mitral valve leaflet(s). There is mild calcification of the mitral valve leaflet(s). Mild mitral annular calcification. Mild mitral valve regurgitation. Tricuspid Valve: The tricuspid valve is normal in structure. Tricuspid valve regurgitation is mild. Aortic Valve: The aortic valve is tricuspid. . There is mild thickening and mild calcification of the aortic valve. Aortic valve regurgitation is trivial. Mild aortic valve sclerosis is present, with no evidence of aortic valve stenosis. Mild aortic valve annular calcification. There is mild thickening of the aortic valve. There is mild calcification of the aortic valve. Pulmonic Valve: The pulmonic valve was grossly normal. Pulmonic valve regurgitation is mild. Aorta: The aortic root is normal in size and structure. There is mild (Grade II) atheroma plaque involving the aortic root and ascending aorta. Venous:  The inferior vena cava is normal in size with less than 50% respiratory variability, suggesting right atrial pressure of 8 mmHg. IAS/Shunts: The interatrial septum was not assessed.  LEFT VENTRICLE PLAX 2D LVIDd:         4.40 cm     Diastology LVIDs:         2.90 cm     LV e' lateral:   6.75 cm/s LV PW:         0.90 cm     LV E/e' lateral: 10.1 LV IVS:        0.90 cm     LV e' medial:    6.48 cm/s LVOT diam:     2.00 cm  LV E/e' medial:  10.5 LV SV:         66 LV SV Index:   35 LVOT Area:     3.14 cm  LV Volumes (MOD) LV vol d, MOD A2C: 91.8 ml LV vol d, MOD A4C: 92.5 ml LV vol s, MOD A2C: 32.2 ml LV vol s, MOD A4C: 36.3 ml LV SV MOD A2C:     59.6 ml LV SV MOD A4C:     92.5 ml LV SV MOD BP:      59.0 ml RIGHT VENTRICLE RV S prime:     10.40 cm/s TAPSE (M-mode): 1.7 cm LEFT ATRIUM             Index       RIGHT ATRIUM           Index LA diam:        3.50 cm 1.88 cm/m  RA Area:     10.80 cm LA Vol (A2C):   33.9 ml 18.17 ml/m RA Volume:   21.20 ml  11.36 ml/m LA Vol (A4C):   35.3 ml 18.92 ml/m LA Biplane Vol: 37.3 ml 19.99 ml/m  AORTIC VALVE LVOT Vmax:   97.00 cm/s LVOT Vmean:  73.800 cm/s LVOT VTI:    0.209 m  AORTA Ao Root diam: 3.70 cm MITRAL VALVE                TRICUSPID VALVE MV Area (PHT): 3.72 cm     TR Peak grad:   25.8 mmHg MV Decel Time: 204 msec     TR Vmax:        254.00 cm/s MR Peak grad: 108.2 mmHg MR Vmax:      520.00 cm/s   SHUNTS MV E velocity: 67.90 cm/s   Systemic VTI:  0.21 m MV A velocity: 102.00 cm/s  Systemic Diam: 2.00 cm MV E/A ratio:  0.67 Orpah Cobb MD Electronically signed by Orpah Cobb MD Signature Date/Time: 05/10/2020/2:11:56 PM    Final     Anti-infectives: Anti-infectives (From admission, onward)   Start     Dose/Rate Route Frequency Ordered Stop   05/07/20 1000  metroNIDAZOLE (FLAGYL) IVPB 500 mg     Discontinue     500 mg 100 mL/hr over 60 Minutes Intravenous Every 8 hours 05/07/20 0929     05/07/20 1000  cefTRIAXone (ROCEPHIN) 2 g in sodium chloride 0.9 % 100  mL IVPB     Discontinue     2 g 200 mL/hr over 30 Minutes Intravenous Every 24 hours 05/07/20 0930         Assessment/Plan HTN Hx of CVA6/2020- on plavix (last dose 7/11 in AM) Hx paroxysmal a fib, currently in NSR AKI on CKD stage III Rheumatoid arthritis  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 Bedelia Person  Plan: Patient is tolerating diet and having bowel function, ok for discharge from surgical standpoint. We discussed the importance of continuing a good bowel regimen, recommend colace and miralax BID. I will arrange follow up for the patient with Dr. Bedelia Person in the office to discuss elective partial colectomy.    LOS: 4 days    Franne Forts, [facility] 05/10/2020, 4:25  PM Please see Amion for pager number during day hours 7:00am-4:30pm

## 2020-05-10 NOTE — TOC Initial Note (Addendum)
Transition of Care [facility]) - Initial/Assessment Note    Patient Details  Name: Michelle Graves MRN: [id number] Date of Birth: 22-Jan-1951  [facility] CM/SW Contact:    Kingsley Plan, RN Phone Number: 05/10/2020, 2:00 PM  Clinical Narrative:                 Confirmed face sheet information with patient.   Patient has walkers and 3 in 1 at home already. Patient does live alone but daughter lives close by and assists her.   Provided Medicare.gov list of home health agencies. Patient has had [facility] in past and would like them again. Called Cory with [facility] and he has accepted referral will need signed orders and face to face.   Patient's PCP at [facility] [phone] f/u May 18, 2020 at 2:20 pm    Expected Discharge Plan: Home w Home Health Services Barriers to Discharge: Continued Medical Work up   Patient Goals and CMS Choice Patient states their goals for this hospitalization and ongoing recovery are:: to return to home CMS Medicare.gov Compare Post Acute Care list provided to:: Patient Choice offered to / list presented to : Patient  Expected Discharge Plan and Services Expected Discharge Plan: Home w Home Health Services   Discharge Planning Services: CM Consult Post Acute Care Choice: Home Health Living arrangements for the past 2 months: Single Family Home                 DME Arranged: N/A         HH Arranged: PT, OT HH Agency: [facility] Date [facility] Agency Contacted: 05/10/20 Time HH Agency Contacted: [id number] Representative spoke with at [facility] Agency: Kandee Keen checking to see if he can accept  Prior Living Arrangements/Services Living arrangements for the past 2 months: Single Family Home Lives with:: Self Patient language and need for interpreter reviewed:: Yes Do you feel safe going back to the place where you live?: Yes      Need for Family Participation in Patient Care: Yes (Comment) Care giver support  system in place?: Yes (comment) Current home services: DME Criminal Activity/Legal Involvement Pertinent to Current Situation/Hospitalization: No - Comment as needed  Activities of Daily Living      Permission Sought/Granted   Permission granted to share information with : No              Emotional Assessment Appearance:: Appears stated age Attitude/Demeanor/Rapport: Engaged Affect (typically observed): Accepting Orientation: : Oriented to Self, Oriented to Place, Oriented to  Time, Oriented to Situation Alcohol / Substance Use: Not Applicable Psych Involvement: No (comment)  Admission diagnosis:  Small bowel obstruction (HCC) [K56.609] SBO (small bowel obstruction) (HCC) [K56.609] Encounter for imaging study to confirm nasogastric (NG) tube placement [Z01.89] Patient Active Problem List   Diagnosis Date Noted  . Bowel obstruction (HCC) 05/06/2020  . SIRS (systemic inflammatory response syndrome) (HCC) 05/06/2020  . UTI (urinary tract infection) 11/08/2019  . Major depressive disorder, recurrent episode, moderate (HCC) 11/08/2019  . Urinary retention 10/28/2019  . Acute on chronic kidney failure (HCC) 10/27/2019  . Malnutrition of moderate degree 10/03/2019  . Colitis 09/30/2019  . AKI (acute kidney injury) (HCC) 05/28/2019  . Nausea & vomiting 05/28/2019  . 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 Anemia, chronic disease 05/28/2019  . Swelling   . Small vessel disease, cerebrovascular   . Acute ischemic stroke (HCC)   . Weakness   . Acute left ankle  pain   . Acute on chronic renal failure (HCC) 05/13/2019  . Weakness of both lower extremities 05/13/2019  . Cerebral thrombosis with cerebral infarction 03/31/2019  . New onset a-fib (HCC) 03/27/2019  . Acute kidney injury superimposed on CKD (HCC) 03/24/2019  . Hypotension 03/24/2019  . Hematemesis 03/24/2019  . Dark stools 03/24/2019  . Diverticulitis of intestine without perforation or abscess without bleeding   . Sinus  tachycardia 08/27/2016  . Diverticulitis 08/26/2016  . HYPERTENSION, MALIGNANT ESSENTIAL 06/24/2007  . KIDNEY DISEASE, CHRONIC, STAGE III 06/24/2007  . ARTHRITIS, RHEUMATOID, SEROPOSITIVE 06/24/2007   PCP:  [facility], [facility]:   [id number] - [name], [place] - [address] AT [facility] OF [address] [place] [address] Phone: [phone] Fax: [phone]     Social Determinants of Health (SDOH) Interventions    Readmission Risk Interventions Readmission Risk Prevention Plan 10/28/2019 10/05/2019 05/31/2019  Transportation Screening Complete Complete Complete  PCP or Specialist Appt within 3-5 Days - Not Complete Not Complete  Not Complete comments - - plan for SNF  HRI or Home Care Consult - Complete Not Complete  HRI or Home Care Consult comments - - plan for SNF  Social Work Consult for Recovery Care Planning/Counseling - Complete Complete  Palliative Care Screening - Not Applicable Not Applicable  Medication Review (RN Care Manager) Referral to Pharmacy Complete Complete  PCP or Specialist appointment within 3-5 days of discharge Not Complete - -  PCP/Specialist Appt Not Complete comments due to holiday, pt to make appointment - -  HRI or Home Care Consult Complete - -  SW Recovery Care/Counseling Consult Complete - -  Palliative Care Screening Not Applicable - -  Skilled Nursing Facility Complete - -  Some recent data might be hidden

## 2020-05-10 NOTE — Progress Notes (Signed)
Pt down to Endoscopy per wheelchair. Notified MD of pt's BP at 170/107 and potassium at 2.9. New orders received. Endorsed to Endoscopy Nurse.

## 2020-05-10 NOTE — Progress Notes (Signed)
Patient ID: Michelle Graves, female   DOB: [birth year], 69 y.o.   MRN: [id number]  PROGRESS NOTE    Michelle Graves  VHQ:[id number] DOB: January 28, 1951 DOA: 05/06/2020 PCP: [facility]   Brief Narrative:  69 year old female with history of hypertension, chronic kidney disease stage IIIb, diverticulitis and unspecified CVA presented on 05/06/2020 with black stool vomiting.  Work-up in the ED suggested colonic obstruction.  General surgery and GI were consulted.  Assessment & Plan:   Colonic obstruction with a possibility of pneumatosis SIRS: Probably from above -GI and general surgery following.   -Status post colonoscopy today by GI which showed moderate to severe localized colitis in the transverse colon along with evidence of nonobstructive stenosis at splenic flexure.  NG tube has been removed by GI.  Patient has been started on full liquid diet.  Currently has NG tube.   -Continue Rocephin and Flagyl  Paroxysmal SVT -Heart rate went up to the 170s last night.  Currently asymptomatic.  Will request cardiology evaluation.  Have notified Dr. Algie Coffer.  Leukocytosis -Probably reactive.  Monitor.  Improving  Hypertension -Blood pressure still elevated. -Continue as needed hydralazine and Lopressor  Chronic kidney disease stage IIIb -Creatinine stable.  Monitor  Anemia of chronic disease -From chronic kidney disease.  Hemoglobin stable  Hypokalemia -Replace.  Repeat a.m. labs  Hypomagnesemia -Replace.  Repeat a.m. labs  GERD -Continue IV PPI  Rheumatoid arthritis -On chronic steroids.  Continue weaning hydrocortisone IV   DVT prophylaxis: Heparin subcutaneous Code Status: Full Family Communication: Patient at bedside Disposition Plan: Status is: Inpatient  Remains inpatient appropriate because:Inpatient level of care appropriate due to severity of illness   Dispo: The patient is from: Home              Anticipated d/c is to: Home               Anticipated d/c date is: 1 day              Patient currently is not medically stable to d/c.    Consultants: GI/general surgery.  Consulted Dr. Algie Coffer today  Procedures:  Colonoscopy Findings ----------- -Scope was advanced to transverse colon before procedure was aborted because of poor prep. -Evidence of moderate to severe localized colitis in the transverse colon.  Biopsies taken -Evidence of nonobstructing stenosis at splenic flexure.  Scope was advanced without any resistance. -Mild inflammation in the rectum.  Biopsies taken -Diverticulosis and hemorrhoids.  Recommendations ------------------------ -Start full liquid diet and advance as tolerated -Follow biopsies -Continue antibiotics for now -Hopefully discharge in few days if able to tolerate diet. -GI will follow  Antimicrobials:  Anti-infectives (From admission, onward)   Start     Dose/Rate Route Frequency Ordered Stop   05/07/20 1000  [MAR Hold]  metroNIDAZOLE (FLAGYL) IVPB 500 mg     Discontinue     (MAR Hold since Thu 05/10/2020 at 0701.Hold Reason: Transfer to a Procedural area.)   500 mg 100 mL/hr over 60 Minutes Intravenous Every 8 hours 05/07/20 0929     05/07/20 1000  [MAR Hold]  cefTRIAXone (ROCEPHIN) 2 g in sodium chloride 0.9 % 100 mL IVPB     Discontinue     (MAR Hold since Thu 05/10/2020 at 0701.Hold Reason: Transfer to a Procedural area.)   2 g 200 mL/hr over 30 Minutes Intravenous Every 24 hours 05/07/20 0930         Subjective: Patient seen and examined at bedside.  Denies  worsening abdominal pain, fevers, vomiting.  No worsening shortness breath reported.  Denies any chest pains.  Objective: Vitals:   05/10/20 0305 05/10/20 0521 05/10/20 0630 05/10/20 0712  BP: (!) 150/130 (!) 155/99 (!) 170/107 (!) 189/115  Pulse: 89 98 92 75  Resp:  18 18 13   Temp: 98.4 F (36.9 C) 98 F (36.7 C) 98.2 F (36.8 C) 98.7 F (37.1 C)  TempSrc: Oral Oral Oral Oral  SpO2: 99% 100% 98% 100%  Weight:       Height:        Intake/Output Summary (Last 24 hours) at 05/10/2020 0745 Last data filed at 05/10/2020 0500 Gross per 24 hour  Intake 3750 ml  Output 0 ml  Net 3750 ml   Filed Weights   05/06/20 0753 05/09/20 1113  Weight: 77.1 kg 77.1 kg    Examination:  General exam: No acute distress.   ENT: NG tube has been removed Respiratory system: Bilateral decreased breath sounds at bases, no wheezing cardiovascular system: S1 & S2 heard, currently rate controlled gastrointestinal system: Abdomen is nondistended, soft and nontender.  Bowel sounds sluggish.   Extremities: Bilateral lower extremity mild edema present.  No cyanosis or clubbing.   Data Reviewed: I have personally reviewed following labs and imaging studies  CBC: Recent Labs  Lab 05/06/20 0814 05/06/20 0814 05/06/20 1624 05/07/20 0514 05/08/20 0215 05/09/20 0407 05/10/20 0408  WBC 14.9*  --   --  13.6* 14.0* 13.1* 11.7*  NEUTROABS  --   --   --   --  12.3* 10.5* 7.0  HGB 12.3   < > 11.5* 11.5* 10.8* 9.6* 9.9*  HCT 37.7   < > 35.8* 35.1* 33.5* 29.8* 31.0*  MCV 92.4  --   --  94.6 94.9 95.2 96.3  PLT 257  --   --  251 246 210 225   < > = values in this interval not displayed.   Basic Metabolic Panel: Recent Labs  Lab 05/06/20 0814 05/07/20 0514 05/08/20 0215 05/09/20 0407 05/10/20 0408  NA 138 143 142 141 140  K 3.8 4.4 3.8 3.8 2.9*  CL 103 109 108 109 110  CO2 23 21* 20* 21* 21*  GLUCOSE 165* 113* 130* 136* 112*  BUN 32* 27* 23 20 12   CREATININE 1.74* 1.52* 1.43* 1.22* 1.18*  CALCIUM 9.8 9.1 9.4 8.8* 8.5*  MG  --   --  2.1 2.0 1.6*   GFR: Estimated Creatinine Clearance: 48.5 mL/min (A) (by C-G formula based on SCr of 1.18 mg/dL (H)). Liver Function Tests: Recent Labs  Lab 05/06/20 0814 05/07/20 0514 05/08/20 0215 05/09/20 0407 05/10/20 0408  AST 19 24 23 18 18   ALT 15 16 14 14 13   ALKPHOS 107 82 86 69 67  BILITOT 0.5 0.9 0.7 0.3 0.5  PROT 7.9 6.6 6.8 5.8* 5.7*  ALBUMIN 4.0 3.6 3.5 2.9*  3.1*   Recent Labs  Lab 05/06/20 0814  LIPASE 26   No results for input(s): AMMONIA in the last 168 hours. Coagulation Profile: Recent Labs  Lab 05/07/20 1022  INR 1.2   Cardiac Enzymes: No results for input(s): CKTOTAL, CKMB, CKMBINDEX, TROPONINI in the last 168 hours. BNP (last 3 results) No results for input(s): PROBNP in the last 8760 hours. HbA1C: No results for input(s): HGBA1C in the last 72 hours. CBG: Recent Labs  Lab 05/07/20 2215  GLUCAP 102*   Lipid Profile: No results for input(s): CHOL, HDL, LDLCALC, TRIG, CHOLHDL, LDLDIRECT in the last 72 hours.  Thyroid Function Tests: No results for input(s): TSH, T4TOTAL, FREET4, T3FREE, THYROIDAB in the last 72 hours. Anemia Panel: No results for input(s): VITAMINB12, FOLATE, FERRITIN, TIBC, IRON, RETICCTPCT in the last 72 hours. Sepsis Labs: Recent Labs  Lab 05/06/20 1624 05/07/20 1022  LATICACIDVEN 2.8* 2.3*    Recent Results (from the past 240 hour(s))  SARS Coronavirus 2 by RT PCR (hospital order, performed in [facility] hospital lab) Nasopharyngeal Nasopharyngeal Swab     Status: None   Collection Time: 05/06/20  1:41 PM   Specimen: Nasopharyngeal Swab  Result Value Ref Range Status   SARS Coronavirus 2 NEGATIVE NEGATIVE Final    Comment: (NOTE) SARS-CoV-2 target nucleic acids are NOT DETECTED.  The SARS-CoV-2 RNA is generally detectable in upper and lower respiratory specimens during the acute phase of infection. The lowest concentration of SARS-CoV-2 viral copies this assay can detect is 250 copies / mL. A negative result does not preclude SARS-CoV-2 infection and should not be used as the sole basis for treatment or other patient management decisions.  A negative result may occur with improper specimen collection / handling, submission of specimen other than nasopharyngeal swab, presence of viral mutation(s) within the areas targeted by this assay, and inadequate number of viral copies (<250 copies  / mL). A negative result must be combined with clinical observations, patient history, and epidemiological information.  Fact Sheet for Patients:   BoilerBrush.com.cy  Fact Sheet for Healthcare Providers: https://pope.com/  This test is not yet approved or  cleared by the [place] FDA and has been authorized for detection and/or diagnosis of SARS-CoV-2 by FDA under an Emergency Use Authorization (EUA).  This EUA will remain in effect (meaning this test can be used) for the duration of the COVID-19 declaration under Section 564(b)(1) of the Act, 21 U.S.C. section 360bbb-3(b)(1), unless the authorization is terminated or revoked sooner.  Performed at [facility], [address], [place], [place] [address]          Radiology Studies: DG BE (COLON)W SINGLE CM (SOL OR THIN BA)  Result Date: 05/08/2020 CLINICAL DATA:  Clinical constipation. Abdominal pain. CT demonstrating colonic distension to the level of a transition point just beyond the splenic flexure. Prior colonoscopy demonstrating an area of inflamed mucosa within the distal transverse colon (negative biopsy). EXAM: WATER SOLUBLE CONTRAST ENEMA TECHNIQUE: Single-contrast enema performed with limitations secondary to patient immobility and clinical status. FLUOROSCOPY TIME:  Fluoroscopy Time:  4 minutes and 30 seconds Radiation Exposure Index (if provided by the fluoroscopic device): Not applicable. Number of Acquired Spot Images: 0 COMPARISON:  Plain film 05/07/2020. CT 05/06/2020. Colonoscopy from 10/05/2019. FINDINGS: Preprocedure scout film demonstrates a large amount of stool throughout the colon. Contrast and stool within the ascending colon. Nasogastric tube terminating over the gastric body. Rectal contrast administration demonstrates large amount of stool throughout the colon. No dominant stricture identified within the descending or sigmoid colon. Scattered colonic  diverticula. Contrast partially opacifies to the level of the mid transverse colon, which is primarily stool-filled and moderately dilated. Due to patient discomfort, the more proximal colon could not be opacified. IMPRESSION: Limited, single-contrast exam, as detailed above. Contrast opacifies to the level of the mid transverse colon, which is primarily stool-filled. Given above limitations, no dominant stricture identified. Electronically Signed   By: Jeronimo Greaves M.D.   On: 05/08/2020 09:48        Scheduled Meds: . [MAR Hold] heparin injection (subcutaneous)  5,000 Units Subcutaneous Q8H  . [facility]  Hold] hydrocortisone sod succinate (SOLU-CORTEF) inj  25 mg Intravenous Q8H  . [MAR Hold] metoprolol tartrate  5 mg Intravenous Once  . [MAR Hold] pantoprazole (PROTONIX) IV  40 mg Intravenous Q12H  . [MAR Hold] polyethylene glycol  17 g Oral Daily   Continuous Infusions: . sodium chloride    . [MAR Hold] cefTRIAXone (ROCEPHIN)  IV 2 g (05/09/20 1047)  . dextrose 5 % and 0.45 % NaCl with KCl 20 mEq/L 75 mL/hr at 05/09/20 2114  . [MAR Hold] metronidazole 100 mL/hr at 05/10/20 0128  . [MAR Hold] potassium chloride            Glade Lloyd, MD [facility] 05/10/2020, 7:45 AM

## 2020-05-10 NOTE — Progress Notes (Signed)
   05/10/20 0250  Assess: MEWS Score  Temp 98.4 F (36.9 C)  BP (!) 150/130  Pulse Rate (!) 128  ECG Heart Rate (!) 171  Resp 17  SpO2 99 %  O2 Device Room Air  Assess: MEWS Score  MEWS Temp 0  MEWS Systolic 0  MEWS Pulse 3  MEWS RR 0  MEWS LOC 0  MEWS Score 3  MEWS Score Color Yellow  Assess: if the MEWS score is Yellow or Red  Were vital signs taken at a resting state? No (pt up to the commode)  Focused Assessment Documented focused assessment  Early Detection of Sepsis Score *See Row Information* Medium  MEWS guidelines implemented *See Row Information* Yes  Treat  MEWS Interventions Administered prn meds/treatments;Escalated (See documentation below)  Take Vital Signs  Increase Vital Sign Frequency  Yellow: Q 2hr X 2 then Q 4hr X 2, if remains yellow, continue Q 4hrs  Escalate  MEWS: Escalate Yellow: discuss with charge nurse/RN and consider discussing with provider and RRT  Notify: Charge Nurse/RN  Name of Charge Nurse/RN Notified L[place], RN  Date Charge Nurse/RN Notified 05/10/20  Time Charge Nurse/RN Notified 0325  Notify: Provider  Provider Name/Title Katherina Right  Date Provider Notified 05/10/20  Time Provider Notified [phone]  Notification Type Page  Notification Reason Other (Comment) (yellow MEWS)  Response No new orders;Other (Comment) (continue to monitor)  Notify: Rapid Response  Name of Rapid Response RN Notified n/a

## 2020-05-10 NOTE — Progress Notes (Signed)
Nutrition Follow-up  RD working remotely.  DOCUMENTATION CODES:   Non-severe (moderate) malnutrition in context of chronic illness  INTERVENTION:   - Ensure Enlive po TID, each supplement provides 350 kcal and 20 grams of protein  - Magic Cup TID with meals, each supplement provides 290 kcal and 9 grams of protein  - Encourage adequate PO intake  NUTRITION DIAGNOSIS:   Moderate Malnutrition related to chronic illness (recurrent colitis) as evidenced by mild fat depletion, mild muscle depletion, moderate muscle depletion, percent weight loss (7.1% weight loss in less than 2 months).  Ongoing  GOAL:   Patient will meet greater than or equal to 90% of their needs  Progressing  MONITOR:   Diet advancement, Labs, Weight trends, I & O's  REASON FOR ASSESSMENT:   Malnutrition Screening Tool    ASSESSMENT:   69 year old female who presented on 7/11 with N/V, constipation. PMH of HTN, CKD stage IIIb, diverticulitis, CVA, CMV colitis. CT scan demonstrating significant stool burden throughout the right and transverse colon with an abrupt transition in the region of the splenic flexure concerning for colonic obstruction. NGT placed to low intermittent suction.  Gastrografin enema negative for stricture. Pt had colonoscopy on 7/14 that was aborted due to poor bowel prep. Follow-up colonoscopy completed today showing moderate to severe localized colitis in transverse colon along with evidence of nonobstructive stenosis at splenic fixture. NG tube removed and pt started on full liquid diet.  No meal completions recorded since diet advanced this morning. RD was unable to reach pt via phone call to room. Pt has consumed Ensure supplements in the past. RD will order Ensure Enlive along with Magic Cups to aid pt in meeting kcal and protein needs.  Weight stable at this time.  Medications reviewed and include: solu-cortef, protonix, miralax, Klor-con 40 mEq x 2, IV abx  Labs reviewed:  potassium 2.9, magnesium 1.6, hemoglobin 9.9  Diet Order:   Diet Order            Diet full liquid Room service appropriate? Yes; Fluid consistency: Thin  Diet effective now                 EDUCATION NEEDS:   No education needs have been identified at this time  Skin:  Skin Assessment: Reviewed RN Assessment  Last BM:  05/10/20 type 7  Height:   Ht Readings from Last 1 Encounters:  05/09/20 5\' 6"  (1.676 m)    Weight:   Wt Readings from Last 1 Encounters:  05/09/20 77.1 kg    Ideal Body Weight:  59.1 kg  BMI:  Body mass index is 27.43 kg/m.  Estimated Nutritional Needs:   Kcal:  1800-2000  Protein:  85-100 grams  Fluid:  1.8-2.0 L    05/11/20, MS, RD, LDN Inpatient Clinical Dietitian Please see AMiON for contact information.

## 2020-05-10 NOTE — Op Note (Signed)
[facility] Patient Name: Michelle Graves Procedure Date : 05/10/2020 MRN: [id number] Attending MD: Kathi Der , MD Date of Birth: 11-Jan-1951 CSN: [id number] Age: 69 Admit Type: Inpatient Procedure:                Colonoscopy Indications:              Abnormal CT of the GI tract, Abdominal pain,                            Constipation Providers:                Kathi Der, MD, Vicki Mallet, RN, Wanita Chamberlain, Technician Referring MD:              Medicines:                Sedation Administered by an Anesthesia Professional Complications:            No immediate complications. Estimated Blood Loss:     Estimated blood loss was minimal. Procedure:                Pre-Anesthesia Assessment:                           - Prior to the procedure, a History and Physical                            was performed, and patient medications and                            allergies were reviewed. The patient's tolerance of                            previous anesthesia was also reviewed. The risks                            and benefits of the procedure and the sedation                            options and risks were discussed with the patient.                            All questions were answered, and informed consent                            was obtained. Prior Anticoagulants: The patient has                            taken Plavix (clopidogrel), last dose was 4 days                            prior to procedure. ASA Grade Assessment: III - A  patient with severe systemic disease. After                            reviewing the risks and benefits, the patient was                            deemed in satisfactory condition to undergo the                            procedure.                           After obtaining informed consent, the colonoscope                            was passed under direct vision. Throughout the                             procedure, the patient's blood pressure, pulse, and                            oxygen saturations were monitored continuously. The                            PCF-H190DL ([id number] Olympus pediatric colonscope                            was introduced through the anus with the intention                            of advancing to the cecum. The scope was advanced                            to the transverse colon before the procedure was                            aborted. Medications were given. The colonoscopy                            was technically difficult and complex due to poor                            bowel prep with stool present. The patient                            tolerated the procedure well. The quality of the                            bowel preparation was poor. Scope In: 7:46:01 AM Scope Out: 8:03:40 AM Total Procedure Duration: 0 hours 17 minutes 39 seconds  Findings:      The perianal and digital rectal examinations were normal.      A large amount of semi-solid solid stool was found in the entire colon,       interfering  with visualization. Lavage of the area was performed,       resulting in incomplete clearance with continued poor visualization. The       scope was advanced to the transverse colon before the procedure was       aborted      A localized area of moderately congested, eroded, friable (with contact       bleeding), inflamed and ulcerated mucosa was found in the transverse       colon. Biopsies were taken with a cold forceps for histology.      A benign-appearing, intrinsic mild stenosis was found at the splenic       flexure and was traversed.      Multiple diverticula were found in the sigmoid colon.      A diffuse area of mildly congested, erythematous and inflamed mucosa was       found in the rectum. Biopsies were taken with a cold forceps for       histology.      Internal hemorrhoids were found during retroflexion.  The hemorrhoids       were medium-sized. Impression:               - Preparation of the colon was poor.                           - Stool in the entire examined colon.                           - Congested, eroded, friable (with contact                            bleeding), inflamed and ulcerated mucosa in the                            transverse colon. Biopsied.                           - Stricture at the splenic flexure.                           - Diverticulosis in the sigmoid colon.                           - Congested, erythematous and inflamed mucosa in                            the rectum. Biopsied.                           - Internal hemorrhoids. Recommendation:           - Return patient to hospital ward for ongoing care.                           - Full liquid diet.                           - Continue present medications.                           -  Await pathology results. Procedure Code(s):        --- Professional ---                           [phone], 52, Colonoscopy, flexible; with biopsy,                            single or multiple Diagnosis Code(s):        --- Professional ---                           K64.8, Other hemorrhoids                           K63.89, Other specified diseases of intestine                           K56.699, Other intestinal obstruction unspecified                            as to partial versus complete obstruction                           R10.9, Unspecified abdominal pain                           K59.00, Constipation, unspecified                           K57.30, Diverticulosis of large intestine without                            perforation or abscess without bleeding                           R93.3, Abnormal findings on diagnostic imaging of                            other parts of digestive tract CPT copyright 2019 American Medical Association. All rights reserved. The codes documented in this report are preliminary and upon coder  review may  be revised to meet current compliance requirements. Kathi Der, MD Kathi Der, MD 05/10/2020 8:17:12 AM Number of Addenda: 0

## 2020-05-10 NOTE — Transfer of Care (Signed)
Immediate Anesthesia Transfer of Care Note  Patient: Michelle Graves  Procedure(s) Performed: COLONOSCOPY WITH PROPOFOL (N/A ) BIOPSY  Patient Location: PACU  Anesthesia Type:MAC  Level of Consciousness: awake, alert  and oriented  Airway & Oxygen Therapy: Patient Spontanous Breathing  Post-op Assessment: Report given to RN and Post -op Vital signs reviewed and stable  Post vital signs: Reviewed and stable  Last Vitals:  Vitals Value Taken Time  BP 155/93 05/10/20 0811  Temp    Pulse 85 05/10/20 0811  Resp 16 05/10/20 0811  SpO2 100 % 05/10/20 0811  Vitals shown include unvalidated device data.  Last Pain:  Vitals:   05/10/20 5498  TempSrc: Oral  PainSc: 0-No pain      Patients Stated Pain Goal: 0 (26/41/58 3094)  Complications: No complications documented.

## 2020-05-10 NOTE — Brief Op Note (Signed)
05/06/2020 - 05/10/2020  8:17 AM  PATIENT:  Michelle Graves  69 y.o. female  PRE-OPERATIVE DIAGNOSIS:  Colitis, constipation, possible colonic stricture  POST-OPERATIVE DIAGNOSIS:  diverticulosis. poor prep, unable to reach cecum, biopsy transverse & rectum  PROCEDURE:  Procedure(s): COLONOSCOPY WITH PROPOFOL (N/A) BIOPSY  SURGEON:  Surgeon(s) and Role:    * Jaloni Davoli, MD - Primary  Findings ----------- -Scope was advanced to transverse colon before procedure was aborted because of poor prep. -Evidence of moderate to severe localized colitis in the transverse colon.  Biopsies taken -Evidence of nonobstructing stenosis at splenic flexure.  Scope was advanced without any resistance. -Mild inflammation in the rectum.  Biopsies taken -Diverticulosis and hemorrhoids.  Recommendations ------------------------ -Start full liquid diet and advance as tolerated -Follow biopsies -Continue antibiotics for now -Hopefully discharge in few days if able to tolerate diet. -GI will follow  Kathi Der MD, FACP 05/10/2020, 8:19 AM  Contact #  [phone]

## 2020-05-10 NOTE — Anesthesia Procedure Notes (Signed)
Procedure Name: MAC Date/Time: 05/10/2020 7:40 AM Performed by: Lowella Dell, CRNA Pre-anesthesia Checklist: Patient identified, Emergency Drugs available, Suction available, Patient being monitored and Timeout performed Patient Re-evaluated:Patient Re-evaluated prior to induction Oxygen Delivery Method: Simple face mask Induction Type: IV induction Placement Confirmation: positive ETCO2 Dental Injury: Teeth and Oropharynx as per pre-operative assessment

## 2020-05-10 NOTE — Progress Notes (Signed)
[facility] Gastroenterology Progress Note  Michelle Graves 69 y.o. November 29, 1950  CC: Abdominal pain, constipation, possible colonic stricture   Subjective: Patient seen and examined at bedside.  Colonoscopy yesterday was aborted because of poor prep.  She drank another colon prep yesterday without any issues.  Denied nausea and vomiting.  Denies abdominal pain.    Objective: Vital signs in last 24 hours: Vitals:   05/10/20 0630 05/10/20 0712  BP: (!) 170/107 (!) 189/115  Pulse: 92 75  Resp: 18 13  Temp: 98.2 F (36.8 C) 98.7 F (37.1 C)  SpO2: 98% 100%    Physical Exam:  General:  Alert, cooperative, no distress, appears stated age, NG tube in place  Head:  Normocephalic, without obvious abnormality, atraumatic  Eyes:  , EOM's intact,   Lungs:   Clear to auscultation bilaterally, respirations unlabored  Heart:  Regular rate and rhythm, S1, S2 normal  Abdomen:   Soft, non-tender, bowel sounds present, no peritoneal signs  Psych  mood and affect normal       Lab Results: Recent Labs    05/09/20 0407 05/10/20 0408  NA 141 140  K 3.8 2.9*  CL 109 110  CO2 21* 21*  GLUCOSE 136* 112*  BUN 20 12  CREATININE 1.22* 1.18*  CALCIUM 8.8* 8.5*  MG 2.0 1.6*   Recent Labs    05/09/20 0407 05/10/20 0408  AST 18 18  ALT 14 13  ALKPHOS 69 67  BILITOT 0.3 0.5  PROT 5.8* 5.7*  ALBUMIN 2.9* 3.1*   Recent Labs    05/09/20 0407 05/10/20 0408  WBC 13.1* 11.7*  NEUTROABS 10.5* 7.0  HGB 9.6* 9.9*  HCT 29.8* 31.0*  MCV 95.2 96.3  PLT 210 225   Recent Labs    05/07/20 1022  LABPROT 14.2  INR 1.2      Assessment/Plan: Abdominal pain with constipation.    CT scan showed colonic obstruction with transition point at splenic flexure.  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 Patient had 2 attempted colonoscopy here at [facility] and 1 flex sig at [facility] in last 1 year.  It showed ischemic/inflammatory changes in the descending and transverse colon. -History of CMV  colitis.  -Coffee-ground emesis.  Resolved.  -CT scan concerning for possible pneumatosis on the right colon.  Recommendations ------------------------- -Proceed with colonoscopy today.  Risks (bleeding, infection, bowel perforation that could require surgery, sedation-related changes in cardiopulmonary systems), benefits (identification and possible treatment of source of symptoms, exclusion of certain causes of symptoms), and alternatives (watchful waiting, radiographic imaging studies, empiric medical treatment)  were explained to patient/family in detail and patient wishes to proceed.     Kathi Der MD, FACP 05/10/2020, 7:33 AM  Contact #  [phone]

## 2020-05-11 ENCOUNTER — Other Ambulatory Visit: Payer: Self-pay

## 2020-05-11 LAB — COMPREHENSIVE METABOLIC PANEL
ALT: 12 U/L (ref 0–44)
AST: 17 U/L (ref 15–41)
Albumin: 2.6 g/dL — ABNORMAL LOW (ref 3.5–5.0)
Alkaline Phosphatase: 60 U/L (ref 38–126)
Anion gap: 8 (ref 5–15)
BUN: 11 mg/dL (ref 8–23)
CO2: 21 mmol/L — ABNORMAL LOW (ref 22–32)
Calcium: 8.5 mg/dL — ABNORMAL LOW (ref 8.9–10.3)
Chloride: 112 mmol/L — ABNORMAL HIGH (ref 98–111)
Creatinine, Ser: 1.37 mg/dL — ABNORMAL HIGH (ref 0.44–1.00)
GFR calc Af Amer: 46 mL/min — ABNORMAL LOW (ref >60)
GFR calc non Af Amer: 40 mL/min — ABNORMAL LOW (ref >60)
Glucose, Bld: 125 mg/dL — ABNORMAL HIGH (ref 70–99)
Potassium: 4 mmol/L (ref 3.5–5.1)
Sodium: 141 mmol/L (ref 135–145)
Total Bilirubin: 0.1 mg/dL — ABNORMAL LOW (ref 0.3–1.2)
Total Protein: 5.3 g/dL — ABNORMAL LOW (ref 6.5–8.1)

## 2020-05-11 LAB — CBC WITH DIFFERENTIAL/PLATELET
Abs Immature Granulocytes: 0.12 10*3/uL — ABNORMAL HIGH (ref 0.00–0.07)
Basophils Absolute: 0 10*3/uL (ref 0.0–0.1)
Basophils Relative: 0 %
Eosinophils Absolute: 0.1 10*3/uL (ref 0.0–0.5)
Eosinophils Relative: 1 %
HCT: 29.5 % — ABNORMAL LOW (ref 36.0–46.0)
Hemoglobin: 9.4 g/dL — ABNORMAL LOW (ref 12.0–15.0)
Immature Granulocytes: 1 %
Lymphocytes Relative: 20 %
Lymphs Abs: 2.1 10*3/uL (ref 0.7–4.0)
MCH: 29.9 pg (ref 26.0–34.0)
MCHC: 31.9 g/dL (ref 30.0–36.0)
MCV: 93.9 fL (ref 80.0–100.0)
Monocytes Absolute: 0.9 10*3/uL (ref 0.1–1.0)
Monocytes Relative: 8 %
Neutro Abs: 7.5 10*3/uL (ref 1.7–7.7)
Neutrophils Relative %: 70 %
Platelets: 186 10*3/uL (ref 150–400)
RBC: 3.14 MIL/uL — ABNORMAL LOW (ref 3.87–5.11)
RDW: 13.8 % (ref 11.5–15.5)
WBC: 10.8 10*3/uL — ABNORMAL HIGH (ref 4.0–10.5)
nRBC: 0.2 % (ref 0.0–0.2)

## 2020-05-11 LAB — MAGNESIUM: Magnesium: 2.1 mg/dL (ref 1.7–2.4)

## 2020-05-11 MED ORDER — METRONIDAZOLE 500 MG PO TABS
500.0000 mg | ORAL_TABLET | Freq: Three times a day (TID) | ORAL | 0 refills | Status: AC
Start: 2020-05-11 — End: 2020-05-18

## 2020-05-11 MED ORDER — POLYETHYLENE GLYCOL 3350 17 G PO PACK: 17.0000 g | PACK | Freq: Two times a day (BID) | ORAL | 0 refills | Status: DC

## 2020-05-11 MED ORDER — DILTIAZEM HCL ER COATED BEADS 180 MG PO CP24: 180.0000 mg | ORAL_CAPSULE | Freq: Every day | ORAL | 0 refills | Status: DC

## 2020-05-11 MED ORDER — DOCUSATE SODIUM 100 MG PO CAPS: 100.0000 mg | ORAL_CAPSULE | Freq: Two times a day (BID) | ORAL | 0 refills | Status: DC

## 2020-05-11 MED ORDER — CIPROFLOXACIN HCL 500 MG PO TABS
500.0000 mg | ORAL_TABLET | Freq: Two times a day (BID) | ORAL | 0 refills | Status: AC
Start: 2020-05-11 — End: 2020-05-18

## 2020-05-11 MED ORDER — HYDROCORTISONE NA SUCCINATE PF 100 MG IJ SOLR: 25.0000 mg | Freq: Every day | INTRAMUSCULAR | Status: DC

## 2020-05-11 MED ORDER — METOPROLOL TARTRATE 25 MG PO TABS: 25.0000 mg | ORAL_TABLET | Freq: Two times a day (BID) | ORAL | 0 refills | Status: DC

## 2020-05-11 NOTE — Plan of Care (Signed)

## 2020-05-11 NOTE — Progress Notes (Signed)
Michelle Graves to be D/C'd  per MD order. Discussed with the patient and all questions fully answered.  VSS, Skin clean, dry and intact without evidence of skin break down, no evidence of skin tears noted.  IV catheter discontinued intact. Site without signs and symptoms of complications. Dressing and pressure applied.  An After Visit Summary was printed and given to the patient. Patient received prescription.  D/c education completed with patient/family including follow up instructions, medication list, d/c activities limitations if indicated, with other d/c instructions as indicated by MD - patient able to verbalize understanding, all questions fully answered.   Patient instructed to return to ED, call 911, or call MD for any changes in condition.   Patient to be escorted via WC, and D/C home via private auto.

## 2020-05-11 NOTE — Progress Notes (Signed)
[facility] Progress Note  1 Day Post-Op  Subjective: No BM yet today but passing flatus. Denies pain. Reiterated importance of home bowel regimen.   Objective: Vital signs in last 24 hours: Temp:  [98.5 F (36.9 C)-98.7 F (37.1 C)] 98.7 F (37.1 C) (07/16 0603) Pulse Rate:  [75-99] 89 (07/16 0603) Resp:  [16-18] 16 (07/16 0603) BP: (132-141)/(82-97) 132/82 (07/16 0603) SpO2:  [98 %-100 %] 100 % (07/16 0603) Last BM Date: 05/09/20  Intake/Output from previous day: 07/15 0701 - 07/16 0700 In: 510 [P.O.:60; I.V.:250; IV Piggyback:200] Out: -  Intake/Output this shift: No intake/output data recorded.  PE: Gen: Alert, NAD, pleasant HEENT: EOM's intact, pupils equal and round Pulm:  rate and effort normal Abd: Soft,minimaldistension,+BS, no HSM,nontender Psych: A&Ox4  Skin: no rashes noted, warm and dry   Lab Results:  Recent Labs    05/10/20 0408 05/11/20 0216  WBC 11.7* 10.8*  HGB 9.9* 9.4*  HCT 31.0* 29.5*  PLT 225 186   BMET Recent Labs    05/10/20 0408 05/11/20 0639  NA 140 141  K 2.9* 4.0  CL 110 112*  CO2 21* 21*  GLUCOSE 112* 125*  BUN 12 11  CREATININE 1.18* 1.37*  CALCIUM 8.5* 8.5*   PT/INR No results for input(s): LABPROT, INR in the last 72 hours. CMP     Component Value Date/Time   NA 141 05/11/2020 0639   K 4.0 05/11/2020 0639   CL 112 (H) 05/11/2020 0639   CO2 21 (L) 05/11/2020 0639   GLUCOSE 125 (H) 05/11/2020 0639   BUN 11 05/11/2020 0639   CREATININE 1.37 (H) 05/11/2020 0639   CALCIUM 8.5 (L) 05/11/2020 0639   PROT 5.3 (L) 05/11/2020 0639   ALBUMIN 2.6 (L) 05/11/2020 0639   AST 17 05/11/2020 0639   ALT 12 05/11/2020 0639   ALKPHOS 60 05/11/2020 0639   BILITOT 0.1 (L) 05/11/2020 0639   GFRNONAA 40 (L) 05/11/2020 0639   GFRAA 46 (L) 05/11/2020 0639   Lipase     Component Value Date/Time   LIPASE 26 05/06/2020 0814       Studies/Results: ECHOCARDIOGRAM COMPLETE  Result Date: 05/10/2020     ECHOCARDIOGRAM REPORT   Patient Name:   Michelle Graves Date of Exam: 05/10/2020 Medical Rec #:  [id number]         Height:       66.0 in Accession #:    [id number]        Weight:       170.0 lb Date of Birth:  Aug 09, 1951          BSA:          1.866 m Patient Age:    69 years          BP:           158/103 mmHg Patient Gender: F                 HR:           86 bpm. Exam Location:  Inpatient Procedure: 2D Echo, Cardiac Doppler and Color Doppler Indications:     Mitral valve insufficiency 424.0 / 134.0  History:         Patient has prior history of Echocardiogram examinations, most                  recent 03/26/2020. Stroke, Arrythmias:Atrial Fibrillation; Risk  Factors:Hypertension and Non-Smoker.  Sonographer:     Renella Cunas RDCS Referring Phys:  [id number] Orpah Cobb Diagnosing Phys: Orpah Cobb MD IMPRESSIONS  1. Left ventricular ejection fraction, by estimation, is 60 to 65%. The left ventricle has normal function. The left ventricle has no regional wall motion abnormalities. Left ventricular diastolic parameters are consistent with Grade I diastolic dysfunction (impaired relaxation).  2. Right ventricular systolic function is normal. The right ventricular size is normal. There is normal pulmonary artery systolic pressure.  3. Left atrial size was mildly dilated.  4. The mitral valve is degenerative. Mild mitral valve regurgitation.  5. The aortic valve is tricuspid. Aortic valve regurgitation is trivial. Mild aortic valve sclerosis is present, with no evidence of aortic valve stenosis.  6. There is mild (Grade II) atheroma plaque involving the aortic root and ascending aorta.  7. The inferior vena cava is normal in size with <50% respiratory variability, suggesting right atrial pressure of 8 mmHg. FINDINGS  Left Ventricle: Left ventricular ejection fraction, by estimation, is 60 to 65%. The left ventricle has normal function. The left ventricle has no regional wall motion abnormalities. The left  ventricular internal cavity size was normal in size. There is  no left ventricular hypertrophy. Left ventricular diastolic parameters are consistent with Grade I diastolic dysfunction (impaired relaxation). Right Ventricle: The right ventricular size is normal. No increase in right ventricular wall thickness. Right ventricular systolic function is normal. There is normal pulmonary artery systolic pressure. The tricuspid regurgitant velocity is 2.54 m/s, and  with an assumed right atrial pressure of 8 mmHg, the estimated right ventricular systolic pressure is 33.8 mmHg. Left Atrium: Left atrial size was mildly dilated. Right Atrium: Right atrial size was normal in size. Pericardium: There is no evidence of pericardial effusion. Mitral Valve: The mitral valve is degenerative in appearance. There is mild thickening of the mitral valve leaflet(s). There is mild calcification of the mitral valve leaflet(s). Mild mitral annular calcification. Mild mitral valve regurgitation. Tricuspid Valve: The tricuspid valve is normal in structure. Tricuspid valve regurgitation is mild. Aortic Valve: The aortic valve is tricuspid. . There is mild thickening and mild calcification of the aortic valve. Aortic valve regurgitation is trivial. Mild aortic valve sclerosis is present, with no evidence of aortic valve stenosis. Mild aortic valve annular calcification. There is mild thickening of the aortic valve. There is mild calcification of the aortic valve. Pulmonic Valve: The pulmonic valve was grossly normal. Pulmonic valve regurgitation is mild. Aorta: The aortic root is normal in size and structure. There is mild (Grade II) atheroma plaque involving the aortic root and ascending aorta. Venous: The inferior vena cava is normal in size with less than 50% respiratory variability, suggesting right atrial pressure of 8 mmHg. IAS/Shunts: The interatrial septum was not assessed.  LEFT VENTRICLE PLAX 2D LVIDd:         4.40 cm     Diastology  LVIDs:         2.90 cm     LV e' lateral:   6.75 cm/s LV PW:         0.90 cm     LV E/e' lateral: 10.1 LV IVS:        0.90 cm     LV e' medial:    6.48 cm/s LVOT diam:     2.00 cm     LV E/e' medial:  10.5 LV SV:         66 LV SV Index:   35  LVOT Area:     3.14 cm  LV Volumes (MOD) LV vol d, MOD A2C: 91.8 ml LV vol d, MOD A4C: 92.5 ml LV vol s, MOD A2C: 32.2 ml LV vol s, MOD A4C: 36.3 ml LV SV MOD A2C:     59.6 ml LV SV MOD A4C:     92.5 ml LV SV MOD BP:      59.0 ml RIGHT VENTRICLE RV S prime:     10.40 cm/s TAPSE (M-mode): 1.7 cm LEFT ATRIUM             Index       RIGHT ATRIUM           Index LA diam:        3.50 cm 1.88 cm/m  RA Area:     10.80 cm LA Vol (A2C):   33.9 ml 18.17 ml/m RA Volume:   21.20 ml  11.36 ml/m LA Vol (A4C):   35.3 ml 18.92 ml/m LA Biplane Vol: 37.3 ml 19.99 ml/m  AORTIC VALVE LVOT Vmax:   97.00 cm/s LVOT Vmean:  73.800 cm/s LVOT VTI:    0.209 m  AORTA Ao Root diam: 3.70 cm MITRAL VALVE                TRICUSPID VALVE MV Area (PHT): 3.72 cm     TR Peak grad:   25.8 mmHg MV Decel Time: 204 msec     TR Vmax:        254.00 cm/s MR Peak grad: 108.2 mmHg MR Vmax:      520.00 cm/s   SHUNTS MV E velocity: 67.90 cm/s   Systemic VTI:  0.21 m MV A velocity: 102.00 cm/s  Systemic Diam: 2.00 cm MV E/A ratio:  0.67 Orpah Cobb MD Electronically signed by Orpah Cobb MD Signature Date/Time: 05/10/2020/2:11:56 PM    Final     Anti-infectives: Anti-infectives (From admission, onward)   Start     Dose/Rate Route Frequency Ordered Stop   05/07/20 1000  metroNIDAZOLE (FLAGYL) IVPB 500 mg     Discontinue     500 mg 100 mL/hr over 60 Minutes Intravenous Every 8 hours 05/07/20 0929     05/07/20 1000  cefTRIAXone (ROCEPHIN) 2 g in sodium chloride 0.9 % 100 mL IVPB     Discontinue     2 g 200 mL/hr over 30 Minutes Intravenous Every 24 hours 05/07/20 0930         Assessment/Plan HTN Hx of CVA6/2020- on plavix (last dose 7/11 in AM) Hx paroxysmal a fib, currently in NSR AKI on CKD stage  III Rheumatoid arthritis  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 Bedelia Person  Plan:Patient is tolerating diet and having bowel function, ok for discharge from surgical standpoint. We discussed the importance of continuing a good bowel regimen, recommend colace and miralax BID. I will arrange follow up for the patient with Dr. Bedelia Person in the office to discuss elective partial colectomy.   LOS: 5 days    Michelle Graves , [facility] Surgery 05/11/2020, 8:58 AM Please see Amion for pager number during day hours 7:00am-4:30pm

## 2020-05-11 NOTE — Progress Notes (Signed)
[facility] Gastroenterology Progress Note  Michelle Graves 69 y.o. [birth year]  CC: Abdominal pain, constipation,  colonic stricture   Subjective: Patient seen and examined at bedside.  Feeling much better today.  Tolerating diet.  Denies nausea or vomiting.  Denies abdominal pain.  Had bowel movement this morning.   ROS : Afebrile.  Negative for chest pain and shortness of breath  Objective: Vital signs in last 24 hours: Vitals:   05/10/20 2038 05/11/20 0603  BP: (!) 141/88 132/82  Pulse: 99 89  Resp: 17 16  Temp: 98.5 F (36.9 C) 98.7 F (37.1 C)  SpO2: 98% 100%    Physical Exam:  General:  Alert, cooperative, no distress, appears stated age, NG tube in place  Head:  Normocephalic, without obvious abnormality, atraumatic  Eyes:  , EOM's intact,   Lungs:   Clear to auscultation bilaterally, respirations unlabored  Heart:  Regular rate and rhythm, S1, S2 normal  Abdomen:   Soft, non-tender, bowel sounds present, no peritoneal signs  Psych  mood and affect normal       Lab Results: Recent Labs    05/10/20 0408 05/11/20 0639  NA 140 141  K 2.9* 4.0  CL 110 112*  CO2 21* 21*  GLUCOSE 112* 125*  BUN 12 11  CREATININE 1.18* 1.37*  CALCIUM 8.5* 8.5*  MG 1.6* 2.1   Recent Labs    05/10/20 0408 05/11/20 0639  AST 18 17  ALT 13 12  ALKPHOS 67 60  BILITOT 0.5 0.1*  PROT 5.7* 5.3*  ALBUMIN 3.1* 2.6*   Recent Labs    05/10/20 0408 05/11/20 0216  WBC 11.7* 10.8*  NEUTROABS 7.0 7.5  HGB 9.9* 9.4*  HCT 31.0* 29.5*  MCV 96.3 93.9  PLT 225 186   No results for input(s): LABPROT, INR in the last 72 hours.    Assessment/Plan: Abdominal pain with constipation.    CT scan showed colonic obstruction with transition point at splenic flexure.  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 Patient had 2 attempted colonoscopy here at [facility] and 1 flex sig at [facility] in last 1 year.  It showed ischemic/inflammatory changes in the descending and transverse colon. -History of  CMV colitis.  -Coffee-ground emesis.  Resolved.  -CT scan concerning for possible pneumatosis on the right colon.  Recommendations ------------------------- -Colonoscopy yesterday was aborted again because of poor prep with large amount of solid stool.  She was found to have evidence of localized colitis in the transverse colon as well as possible nonobstructive inflammatory stricture at splenic flexure.  Biopsies pending.  -Able to tolerate diet now without any nausea and vomiting.  -Recommend using MiraLAX up to 3 times a day to avoid constipation.  Increase fiber intake.  Increase water intake.  -Recommend renally adjusted dose of ciprofloxacin and Flagyl for colitis for another 7 days.  -Follow-up in GI clinic in 6 to 8 weeks after discharge.  -Okay to discharge from GI standpoint.  GI will sign off.  Call us back if needed    Kathi Der MD, FACP 05/11/2020, 10:14 AM  Contact #  [phone]

## 2020-05-11 NOTE — Progress Notes (Signed)
Subjective:  Denies any chest pain or shortness of breath.  Denies any palpitations.  Ready to be discharged.  Objective:  Vital Signs in the last 24 hours: Temp:  [98.5 F (36.9 C)-98.7 F (37.1 C)] 98.7 F (37.1 C) (07/16 0603) Pulse Rate:  [75-99] 89 (07/16 0603) Resp:  [16-18] 16 (07/16 0603) BP: (132-141)/(82-97) 132/82 (07/16 0603) SpO2:  [98 %-100 %] 100 % (07/16 0603)  Intake/Output from previous day: 07/15 0701 - 07/16 0700 In: 510 [P.O.:60; I.V.:250; IV Piggyback:200] Out: -  Intake/Output from this shift: No intake/output data recorded.  Physical Exam: Exam unchanged  Lab Results: Recent Labs    05/10/20 0408 05/11/20 0216  WBC 11.7* 10.8*  HGB 9.9* 9.4*  PLT 225 186   Recent Labs    05/10/20 0408 05/11/20 0639  NA 140 141  K 2.9* 4.0  CL 110 112*  CO2 21* 21*  GLUCOSE 112* 125*  BUN 12 11  CREATININE 1.18* 1.37*   No results for input(s): TROPONINI in the last 72 hours.  Invalid input(s): CK, MB Hepatic Function Panel Recent Labs    05/11/20 0639  PROT 5.3*  ALBUMIN 2.6*  AST 17  ALT 12  ALKPHOS 60  BILITOT 0.1*   No results for input(s): CHOL in the last 72 hours. No results for input(s): PROTIME in the last 72 hours.  Imaging: Imaging results have been reviewed and ECHOCARDIOGRAM COMPLETE  Result Date: 05/10/2020    ECHOCARDIOGRAM REPORT   Patient Name:   Michelle Graves Date of Exam: 05/10/2020 Medical Rec #:  [id number]         Height:       66.0 in Accession #:    [id number]        Weight:       170.0 lb Date of Birth:  24-Dec-1950          BSA:          1.866 m Patient Age:    69 years          BP:           158/103 mmHg Patient Gender: F                 HR:           86 bpm. Exam Location:  Inpatient Procedure: 2D Echo, Cardiac Doppler and Color Doppler Indications:     Mitral valve insufficiency 424.0 / 134.0  History:         Patient has prior history of Echocardiogram examinations, most                  recent 03/26/2020. Stroke,  Arrythmias:Atrial Fibrillation; Risk                  Factors:Hypertension and Non-Smoker.  Sonographer:     Renella Cunas RDCS Referring Phys:  [id number] Orpah Cobb Diagnosing Phys: Orpah Cobb MD IMPRESSIONS  1. Left ventricular ejection fraction, by estimation, is 60 to 65%. The left ventricle has normal function. The left ventricle has no regional wall motion abnormalities. Left ventricular diastolic parameters are consistent with Grade I diastolic dysfunction (impaired relaxation).  2. Right ventricular systolic function is normal. The right ventricular size is normal. There is normal pulmonary artery systolic pressure.  3. Left atrial size was mildly dilated.  4. The mitral valve is degenerative. Mild mitral valve regurgitation.  5. The aortic valve is tricuspid. Aortic valve regurgitation is trivial. Mild aortic valve sclerosis is present,  with no evidence of aortic valve stenosis.  6. There is mild (Grade II) atheroma plaque involving the aortic root and ascending aorta.  7. The inferior vena cava is normal in size with <50% respiratory variability, suggesting right atrial pressure of 8 mmHg. FINDINGS  Left Ventricle: Left ventricular ejection fraction, by estimation, is 60 to 65%. The left ventricle has normal function. The left ventricle has no regional wall motion abnormalities. The left ventricular internal cavity size was normal in size. There is  no left ventricular hypertrophy. Left ventricular diastolic parameters are consistent with Grade I diastolic dysfunction (impaired relaxation). Right Ventricle: The right ventricular size is normal. No increase in right ventricular wall thickness. Right ventricular systolic function is normal. There is normal pulmonary artery systolic pressure. The tricuspid regurgitant velocity is 2.54 m/s, and  with an assumed right atrial pressure of 8 mmHg, the estimated right ventricular systolic pressure is 33.8 mmHg. Left Atrium: Left atrial size was mildly dilated. Right  Atrium: Right atrial size was normal in size. Pericardium: There is no evidence of pericardial effusion. Mitral Valve: The mitral valve is degenerative in appearance. There is mild thickening of the mitral valve leaflet(s). There is mild calcification of the mitral valve leaflet(s). Mild mitral annular calcification. Mild mitral valve regurgitation. Tricuspid Valve: The tricuspid valve is normal in structure. Tricuspid valve regurgitation is mild. Aortic Valve: The aortic valve is tricuspid. . There is mild thickening and mild calcification of the aortic valve. Aortic valve regurgitation is trivial. Mild aortic valve sclerosis is present, with no evidence of aortic valve stenosis. Mild aortic valve annular calcification. There is mild thickening of the aortic valve. There is mild calcification of the aortic valve. Pulmonic Valve: The pulmonic valve was grossly normal. Pulmonic valve regurgitation is mild. Aorta: The aortic root is normal in size and structure. There is mild (Grade II) atheroma plaque involving the aortic root and ascending aorta. Venous: The inferior vena cava is normal in size with less than 50% respiratory variability, suggesting right atrial pressure of 8 mmHg. IAS/Shunts: The interatrial septum was not assessed.  LEFT VENTRICLE PLAX 2D LVIDd:         4.40 cm     Diastology LVIDs:         2.90 cm     LV e' lateral:   6.75 cm/s LV PW:         0.90 cm     LV E/e' lateral: 10.1 LV IVS:        0.90 cm     LV e' medial:    6.48 cm/s LVOT diam:     2.00 cm     LV E/e' medial:  10.5 LV SV:         66 LV SV Index:   35 LVOT Area:     3.14 cm  LV Volumes (MOD) LV vol d, MOD A2C: 91.8 ml LV vol d, MOD A4C: 92.5 ml LV vol s, MOD A2C: 32.2 ml LV vol s, MOD A4C: 36.3 ml LV SV MOD A2C:     59.6 ml LV SV MOD A4C:     92.5 ml LV SV MOD BP:      59.0 ml RIGHT VENTRICLE RV S prime:     10.40 cm/s TAPSE (M-mode): 1.7 cm LEFT ATRIUM             Index       RIGHT ATRIUM           Index LA diam:  3.50 cm 1.88  cm/m  RA Area:     10.80 cm LA Vol (A2C):   33.9 ml 18.17 ml/m RA Volume:   21.20 ml  11.36 ml/m LA Vol (A4C):   35.3 ml 18.92 ml/m LA Biplane Vol: 37.3 ml 19.99 ml/m  AORTIC VALVE LVOT Vmax:   97.00 cm/s LVOT Vmean:  73.800 cm/s LVOT VTI:    0.209 m  AORTA Ao Root diam: 3.70 cm MITRAL VALVE                TRICUSPID VALVE MV Area (PHT): 3.72 cm     TR Peak grad:   25.8 mmHg MV Decel Time: 204 msec     TR Vmax:        254.00 cm/s MR Peak grad: 108.2 mmHg MR Vmax:      520.00 cm/s   SHUNTS MV E velocity: 67.90 cm/s   Systemic VTI:  0.21 m MV A velocity: 102.00 cm/s  Systemic Diam: 2.00 cm MV E/A ratio:  0.67 Orpah Cobb MD Electronically signed by Orpah Cobb MD Signature Date/Time: 05/10/2020/2:11:56 PM    Final     Cardiac Studies:  Assessment/Plan:  Status post SVT, paroxysmal Sinus tachycardia S/P Large bowel obstruction CMV colitis Splenic flexure stricture Hypertension H/O stroke CKD, II Hyperglycemia Plan Okay to discharge from cardiac point of view Follow-up with Dr. Algie Coffer in 2 weeks  LOS: 5 days    Rinaldo Cloud 05/11/2020, 12:36 PM

## 2020-05-11 NOTE — Discharge Summary (Signed)
Physician Discharge Summary  Michelle Graves STM:[id number] DOB: 1951-06-21 DOA: 05/06/2020  PCP: [facility]  Admit date: 05/06/2020 Discharge date: 05/11/2020  Admitted From: Home Disposition: Home  Recommendations for Outpatient Follow-up:  1. Follow up with PCP in 1 week with repeat CBC/BMP 2. Outpatient follow-up with GI/general surgery/cardiology 3. Follow up in ED if symptoms worsen or new appear   Home Health: PT/OT Equipment/Devices: None  Discharge Condition: Stable CODE STATUS: Full Diet recommendation: Heart healthy  Brief/Interim Summary: 69 year old female with history of hypertension, chronic kidney disease stage IIIb, diverticulitis and unspecified CVA presented on 05/06/2020 with black stool vomiting.  Work-up in the ED suggested colonic obstruction with possibility of pneumatosis.  She was started on IV antibiotics.  General surgery and GI were consulted.  She underwent colonoscopy on 05/10/2020 which showed moderate to severe localized colitis in the transverse colon along with evidence of nonobstructive stenosis at splenic flexure.  NG tube was removed and she was started on diet.  She is currently tolerating diet.  She also had episodes of paroxysmal SVT during the hospitalization for which Dr. Algie Coffer /cardiology evaluated the patient and started on oral Cardizem and metoprolol.  She will need outpatient cardiology follow-up. GI has cleared the patient for discharge today. She will be discharged home today on oral antibiotics.  She will need outpatient general surgery follow-up as well.   Discharge Diagnoses:   Colonic obstruction with a possibility of pneumatosis SIRS: Probably from above -GI and general surgery following.   -Status post colonoscopy on 05/10/2020 by GI which showed moderate to severe localized colitis in the transverse colon along with evidence of nonobstructive stenosis at splenic flexure.  NG tube has been removed  by GI.  Patient is tolerating full liquid diet.  Will advance diet to soft diet today.   -Continue Rocephin and Flagyl -Patient will be discharged home today. GI has cleared the patient for discharge on oral ciprofloxacin and Flagyl for 7 more days. -General surgery recommends to continue MiraLAX and Colace twice a day with outpatient follow-up with general surgery for possible partial colectomy.  Paroxysmal SVT -Currently asymptomatic.    Cardiology evaluation with Dr. Algie Coffer appreciated.  Patient has been started on oral Cardizem and metoprolol which will be continued.  Outpatient follow-up with Dr. Algie Coffer.  Leukocytosis -Probably reactive.  Resolved.  Hypertension -Blood pressure improving.  Continue Cardizem and metoprolol.  Chronic kidney disease stage IIIb -Creatinine stable.  Outpatient follow-up.  Anemia of chronic disease -From chronic kidney disease.  Hemoglobin stable  Hypokalemia -Improved  Hypomagnesemia -Improved  GERD -Continue PPI on discharge  Rheumatoid arthritis -On chronic steroids.    Treated with weaning doses of IV hydrocortisone.  Resume home regimen on discharge.  Outpatient follow-up  Discharge Instructions   Allergies as of 05/11/2020   No Known Allergies     Medication List    TAKE these medications   ciprofloxacin 500 MG tablet Commonly known as: Cipro Take 1 tablet (500 mg total) by mouth 2 (two) times daily for 7 days.   diltiazem 180 MG 24 hr capsule Commonly known as: CARDIZEM CD Take 1 capsule (180 mg total) by mouth daily.   docusate sodium 100 MG capsule Commonly known as: COLACE Take 1 capsule (100 mg total) by mouth 2 (two) times daily.   doxepin 50 MG capsule Commonly known as: SINEQUAN Take 10 mg by mouth daily.   DULoxetine 60 MG capsule Commonly known as: CYMBALTA Take 60 mg by mouth  at bedtime.   hydroxychloroquine 200 MG tablet Commonly known as: PLAQUENIL Take 200 mg by mouth 2 (two) times daily.    metoprolol tartrate 25 MG tablet Commonly known as: LOPRESSOR Take 1 tablet (25 mg total) by mouth 2 (two) times daily.   metroNIDAZOLE 500 MG tablet Commonly known as: Flagyl Take 1 tablet (500 mg total) by mouth 3 (three) times daily for 7 days.   multivitamin with minerals Tabs tablet Take 1 tablet by mouth daily.   Myrbetriq 25 MG Tb24 tablet Generic drug: mirabegron ER Take 25 mg by mouth daily.   omeprazole 20 MG capsule Commonly known as: PRILOSEC Take 20 mg by mouth daily as needed (acid reflux).   ondansetron 4 MG tablet Commonly known as: ZOFRAN Take 1 tablet (4 mg total) by mouth every 6 (six) hours as needed for nausea. What changed: reasons to take this   polyethylene glycol 17 g packet Commonly known as: MIRALAX / GLYCOLAX Take 17 g by mouth 2 (two) times daily.   prazosin 1 MG capsule Commonly known as: MINIPRESS Take 1 mg by mouth at bedtime.   predniSONE 10 MG tablet Commonly known as: DELTASONE Take 5 mg by mouth daily.   QUEtiapine 25 MG tablet Commonly known as: SEROQUEL Take 25 mg by mouth daily.   solifenacin 5 MG tablet Commonly known as: VESICARE Take 5 mg by mouth daily.        Follow-up Information    [facility], [facility] Follow up.   Specialty: Home Health Services Contact information: [address] [place] [address] [phone]        [facility], [facility]. Go to.   Specialty: Family Medicine Why: May 18, 2020 2:20  Contact information: [address] [place] [address] [phone]        Diamantina Monks, MD. Go on 05/31/2020.   Specialty: Surgery Why: Follow up appointment to discuss elective partial colectomy scheduled for 11:15 AM. Please arrive 30 min prior to appointment time. Bring photo ID and insurance information with you.  Contact information: [address] [place] [address] [phone]        Orpah Cobb, MD. Schedule  an appointment as soon as possible for a visit in 1 week(s).   Specialty: Cardiology Contact information: [address] [place] [address] [phone]        Kathi Der, MD. Schedule an appointment as soon as possible for a visit in 1 week(s).   Specialty: Gastroenterology Contact information: [address] [place] [address] [phone]              No Known Allergies  Consultations:  GI/general surgery/cardiology   Procedures/Studies: CT Abdomen Pelvis Wo Contrast  Result Date: 05/06/2020 CLINICAL DATA:  Abdominal distension.  Nausea and vomiting. EXAM: CT ABDOMEN AND PELVIS WITHOUT CONTRAST TECHNIQUE: Multidetector CT imaging of the abdomen and pelvis was performed following the standard protocol without IV contrast. COMPARISON:  Mar 16, 2020 FINDINGS: Lower chest: No acute abnormality. Hepatobiliary: No focal liver abnormality is seen. No gallstones, gallbladder wall thickening, or biliary dilatation. Pancreas: Unremarkable. No pancreatic ductal dilatation or surrounding inflammatory changes. Spleen: Normal in size without focal abnormality. Adrenals/Urinary Tract: Adrenal glands are unremarkable. Kidneys are normal, without renal calculi, focal lesion, or hydronephrosis. Bladder is unremarkable. Stomach/Bowel: The stomach and small bowel are normal. The descending colon and sigmoid colon are relatively decompressed. There are scattered diverticuli but  no diverticulitis. The colon is markedly distended with stool from just beyond the splenic flexure through the level of the cecum. The cecum measures up to 9 cm in diameter. There is gas outlining the stool in the distal cecum and proximal ascending colon. This gas extends non dependently and is different in appearance compared to the remainder of the colon. No definite wall thickening. No venous gas. The appendix is normal. Vascular/Lymphatic: No significant vascular findings are present. No  enlarged abdominal or pelvic lymph nodes. Reproductive: Uterus and bilateral adnexa are unremarkable. Other: No free air or free fluid. Musculoskeletal: A compression fracture of L5 with concavity of the superior endplate was also present in May of 2021 but may be slightly worsened in the interval. No other acute bony abnormalities. IMPRESSION: 1. Marked distension of the colon from the level of the cecum through the splenic flexure with an abrupt transition point just beyond the splenic flexure. Beyond the transition point, the colon is decompressed. The findings are concerning for a colonic obstruction although an underlying cause is not seen on this study. Recommend colonoscopy to better evaluate the region of transition point. 2. The pattern of gas outlining the stool in the cecum and ascending colon extending dependently raises the possibility of pneumatosis. No definite wall thickening or adjacent fat stranding. No free air. 3. A compression fracture at L5 was present in May of 2021 but appears to have mildly worsened. Findings were called to Dr. Charm Barges. Electronically Signed   By: Gerome Sam III M.D   On: 05/06/2020 13:07   DG Chest Port 1 View  Result Date: 05/07/2020 CLINICAL DATA:  Shortness of breath. EXAM: PORTABLE CHEST 1 VIEW COMPARISON:  November 07, 2019. FINDINGS: The heart size and mediastinal contours are within normal limits. Both lungs are clear. No pneumothorax or pleural effusion is noted. Nasogastric tube tip is seen at the gastroesophageal junction. The visualized skeletal structures are unremarkable. IMPRESSION: No active disease. Nasogastric tube tip seen at gastroesophageal junction. Advancement is recommended. Electronically Signed   By: Lupita Raider M.D.   On: 05/07/2020 09:26   DG Abd Portable 1V  Result Date: 05/07/2020 CLINICAL DATA:  Nasogastric tube placement. EXAM: PORTABLE ABDOMEN - 1 VIEW COMPARISON:  Same day. FINDINGS: The bowel gas pattern is normal.  Nasogastric tube tip seen in proximal stomach. No radio-opaque calculi or other significant radiographic abnormality are seen. IMPRESSION: Nasogastric tube tip seen in proximal stomach. Electronically Signed   By: Lupita Raider M.D.   On: 05/07/2020 14:22   DG Abd Portable 1V  Result Date: 05/07/2020 CLINICAL DATA:  Small bowel obstruction, NG tube EXAM: PORTABLE ABDOMEN - 1 VIEW COMPARISON:  None. FINDINGS: NG tube tip is in the distal esophagus. Large stool burden within the colon. No free air organomegaly. IMPRESSION: NG tube tip in the distal esophagus. Electronically Signed   By: Charlett Nose M.D.   On: 05/07/2020 09:25   DG Abd Portable 1V-Small Bowel Obstruction Protocol-initial, 8 hr delay  Result Date: 05/07/2020 CLINICAL DATA:  69 year old female with small bowel obstruction. EXAM: PORTABLE ABDOMEN - 1 VIEW COMPARISON:  Abdominal radiograph dated 05/06/2020. FINDINGS: Partially visualized enteric tube with tip and side-port in the left upper abdomen likely in the proximal stomach. Partially visualized contrast in the stomach. Faint linear high density over the right hemiabdomen, possibly external to the patient and contamination. Air is noted within the colon. No definite bowel dilatation. The soft tissues and osseous structures are grossly unremarkable. IMPRESSION: Enteric  tube with tip and side-port in the proximal stomach. No significant interval change. Electronically Signed   By: Elgie Collard M.D.   On: 05/07/2020 02:34   DG Abd Portable 1V-Small Bowel Protocol-Position Verification  Result Date: 05/06/2020 CLINICAL DATA:  Enteric catheter placement EXAM: PORTABLE ABDOMEN - 1 VIEW COMPARISON:  05/06/2020 FINDINGS: Frontal view of the lower chest and upper abdomen excludes the right hemidiaphragm and lower pelvis by collimation. Tip and side port of an enteric catheter project over the gastric body. Large amount of stool within the proximal colon again noted. IMPRESSION: 1. Enteric  catheter overlying gastric body. Electronically Signed   By: Sharlet Salina M.D.   On: 05/06/2020 15:13   DG BE (COLON)W SINGLE CM (SOL OR THIN BA)  Result Date: 05/08/2020 CLINICAL DATA:  Clinical constipation. Abdominal pain. CT demonstrating colonic distension to the level of a transition point just beyond the splenic flexure. Prior colonoscopy demonstrating an area of inflamed mucosa within the distal transverse colon (negative biopsy). EXAM: WATER SOLUBLE CONTRAST ENEMA TECHNIQUE: Single-contrast enema performed with limitations secondary to patient immobility and clinical status. FLUOROSCOPY TIME:  Fluoroscopy Time:  4 minutes and 30 seconds Radiation Exposure Index (if provided by the fluoroscopic device): Not applicable. Number of Acquired Spot Images: 0 COMPARISON:  Plain film 05/07/2020. CT 05/06/2020. Colonoscopy from 10/05/2019. FINDINGS: Preprocedure scout film demonstrates a large amount of stool throughout the colon. Contrast and stool within the ascending colon. Nasogastric tube terminating over the gastric body. Rectal contrast administration demonstrates large amount of stool throughout the colon. No dominant stricture identified within the descending or sigmoid colon. Scattered colonic diverticula. Contrast partially opacifies to the level of the mid transverse colon, which is primarily stool-filled and moderately dilated. Due to patient discomfort, the more proximal colon could not be opacified. IMPRESSION: Limited, single-contrast exam, as detailed above. Contrast opacifies to the level of the mid transverse colon, which is primarily stool-filled. Given above limitations, no dominant stricture identified. Electronically Signed   By: Jeronimo Greaves M.D.   On: 05/08/2020 09:48   ECHOCARDIOGRAM COMPLETE  Result Date: 05/10/2020    ECHOCARDIOGRAM REPORT   Patient Name:   TAMIRAH GEORGE Date of Exam: 05/10/2020 Medical Rec #:  [id number]         Height:       66.0 in Accession #:    [id number]         Weight:       170.0 lb Date of Birth:  [birth year]          BSA:          1.866 m Patient Age:    69 years          BP:           158/103 mmHg Patient Gender: F                 HR:           86 bpm. Exam Location:  Inpatient Procedure: 2D Echo, Cardiac Doppler and Color Doppler Indications:     Mitral valve insufficiency 424.0 / 134.0  History:         Patient has prior history of Echocardiogram examinations, most                  recent 03/26/2020. Stroke, Arrythmias:Atrial Fibrillation; Risk                  Factors:Hypertension and Non-Smoker.  Sonographer:     Amil Amen  [place] RDCS Referring Phys:  [id number] Orpah Cobb Diagnosing Phys: Orpah Cobb MD IMPRESSIONS  1. Left ventricular ejection fraction, by estimation, is 60 to 65%. The left ventricle has normal function. The left ventricle has no regional wall motion abnormalities. Left ventricular diastolic parameters are consistent with Grade I diastolic dysfunction (impaired relaxation).  2. Right ventricular systolic function is normal. The right ventricular size is normal. There is normal pulmonary artery systolic pressure.  3. Left atrial size was mildly dilated.  4. The mitral valve is degenerative. Mild mitral valve regurgitation.  5. The aortic valve is tricuspid. Aortic valve regurgitation is trivial. Mild aortic valve sclerosis is present, with no evidence of aortic valve stenosis.  6. There is mild (Grade II) atheroma plaque involving the aortic root and ascending aorta.  7. The inferior vena cava is normal in size with <50% respiratory variability, suggesting right atrial pressure of 8 mmHg. FINDINGS  Left Ventricle: Left ventricular ejection fraction, by estimation, is 60 to 65%. The left ventricle has normal function. The left ventricle has no regional wall motion abnormalities. The left ventricular internal cavity size was normal in size. There is  no left ventricular hypertrophy. Left ventricular diastolic parameters are consistent with Grade I  diastolic dysfunction (impaired relaxation). Right Ventricle: The right ventricular size is normal. No increase in right ventricular wall thickness. Right ventricular systolic function is normal. There is normal pulmonary artery systolic pressure. The tricuspid regurgitant velocity is 2.54 m/s, and  with an assumed right atrial pressure of 8 mmHg, the estimated right ventricular systolic pressure is 33.8 mmHg. Left Atrium: Left atrial size was mildly dilated. Right Atrium: Right atrial size was normal in size. Pericardium: There is no evidence of pericardial effusion. Mitral Valve: The mitral valve is degenerative in appearance. There is mild thickening of the mitral valve leaflet(s). There is mild calcification of the mitral valve leaflet(s). Mild mitral annular calcification. Mild mitral valve regurgitation. Tricuspid Valve: The tricuspid valve is normal in structure. Tricuspid valve regurgitation is mild. Aortic Valve: The aortic valve is tricuspid. . There is mild thickening and mild calcification of the aortic valve. Aortic valve regurgitation is trivial. Mild aortic valve sclerosis is present, with no evidence of aortic valve stenosis. Mild aortic valve annular calcification. There is mild thickening of the aortic valve. There is mild calcification of the aortic valve. Pulmonic Valve: The pulmonic valve was grossly normal. Pulmonic valve regurgitation is mild. Aorta: The aortic root is normal in size and structure. There is mild (Grade II) atheroma plaque involving the aortic root and ascending aorta. Venous: The inferior vena cava is normal in size with less than 50% respiratory variability, suggesting right atrial pressure of 8 mmHg. IAS/Shunts: The interatrial septum was not assessed.  LEFT VENTRICLE PLAX 2D LVIDd:         4.40 cm     Diastology LVIDs:         2.90 cm     LV e' lateral:   6.75 cm/s LV PW:         0.90 cm     LV E/e' lateral: 10.1 LV IVS:        0.90 cm     LV e' medial:    6.48 cm/s LVOT  diam:     2.00 cm     LV E/e' medial:  10.5 LV SV:         66 LV SV Index:   35 LVOT Area:     3.14 cm  LV  Volumes (MOD) LV vol d, MOD A2C: 91.8 ml LV vol d, MOD A4C: 92.5 ml LV vol s, MOD A2C: 32.2 ml LV vol s, MOD A4C: 36.3 ml LV SV MOD A2C:     59.6 ml LV SV MOD A4C:     92.5 ml LV SV MOD BP:      59.0 ml RIGHT VENTRICLE RV S prime:     10.40 cm/s TAPSE (M-mode): 1.7 cm LEFT ATRIUM             Index       RIGHT ATRIUM           Index LA diam:        3.50 cm 1.88 cm/m  RA Area:     10.80 cm LA Vol (A2C):   33.9 ml 18.17 ml/m RA Volume:   21.20 ml  11.36 ml/m LA Vol (A4C):   35.3 ml 18.92 ml/m LA Biplane Vol: 37.3 ml 19.99 ml/m  AORTIC VALVE LVOT Vmax:   97.00 cm/s LVOT Vmean:  73.800 cm/s LVOT VTI:    0.209 m  AORTA Ao Root diam: 3.70 cm MITRAL VALVE                TRICUSPID VALVE MV Area (PHT): 3.72 cm     TR Peak grad:   25.8 mmHg MV Decel Time: 204 msec     TR Vmax:        254.00 cm/s MR Peak grad: 108.2 mmHg MR Vmax:      520.00 cm/s   SHUNTS MV E velocity: 67.90 cm/s   Systemic VTI:  0.21 m MV A velocity: 102.00 cm/s  Systemic Diam: 2.00 cm MV E/A ratio:  0.67 Orpah Cobb MD Electronically signed by Orpah Cobb MD Signature Date/Time: 05/10/2020/2:11:56 PM    Final     Colonoscopy Findings ----------- -Scope was advanced to transverse colon before procedure was aborted because of poor prep. -Evidence of moderate to severe localized colitis in the transverse colon. Biopsies taken -Evidence of nonobstructing stenosis at splenic flexure. Scope was advanced without any resistance. -Mild inflammation in the rectum. Biopsies taken -Diverticulosis and hemorrhoids.  Recommendations ------------------------ -Start full liquid diet and advance as tolerated -Follow biopsies -Continue antibiotics for now -Hopefully discharge in few days if able to tolerate diet. -GI will follow   Subjective: Patient seen and examined at bedside.  She feels better better and wants to go home.  Denies any  overnight fever, vomiting, worsening abdominal pain.  Discharge Exam: Vitals:   05/10/20 2038 05/11/20 0603  BP: (!) 141/88 132/82  Pulse: 99 89  Resp: 17 16  Temp: 98.5 F (36.9 C) 98.7 F (37.1 C)  SpO2: 98% 100%    General: Pt is alert, awake, not in acute distress.   Cardiovascular: rate controlled, S1/S2 + Respiratory: bilateral decreased breath sounds at bases with some scattered crackles abdominal: Soft, NT, ND, bowel sounds + Extremities: Trace lower extremity edema; no cyanosis    The results of significant diagnostics from this hospitalization (including imaging, microbiology, ancillary and laboratory) are listed below for reference.     Microbiology: Recent Results (from the past 240 hour(s))  SARS Coronavirus 2 by RT PCR (hospital order, performed in [facility] hospital lab) Nasopharyngeal Nasopharyngeal Swab     Status: None   Collection Time: 05/06/20  1:41 PM   Specimen: Nasopharyngeal Swab  Result Value Ref Range Status   SARS Coronavirus 2 NEGATIVE NEGATIVE Final    Comment: (NOTE) SARS-CoV-2 target nucleic  acids are NOT DETECTED.  The SARS-CoV-2 RNA is generally detectable in upper and lower respiratory specimens during the acute phase of infection. The lowest concentration of SARS-CoV-2 viral copies this assay can detect is 250 copies / mL. A negative result does not preclude SARS-CoV-2 infection and should not be used as the sole basis for treatment or other patient management decisions.  A negative result may occur with improper specimen collection / handling, submission of specimen other than nasopharyngeal swab, presence of viral mutation(s) within the areas targeted by this assay, and inadequate number of viral copies (<250 copies / mL). A negative result must be combined with clinical observations, patient history, and epidemiological information.  Fact Sheet for Patients:   BoilerBrush.com.cy  Fact Sheet for Healthcare  Providers: https://pope.com/  This test is not yet approved or  cleared by the [place] FDA and has been authorized for detection and/or diagnosis of SARS-CoV-2 by FDA under an Emergency Use Authorization (EUA).  This EUA will remain in effect (meaning this test can be used) for the duration of the COVID-19 declaration under Section 564(b)(1) of the Act, 21 U.S.C. section 360bbb-3(b)(1), unless the authorization is terminated or revoked sooner.  Performed at [facility], [address] [place], [place] [address]      Labs: BNP (last 3 results) Recent Labs    05/08/20 0215 05/09/20 0407  BNP 215.7* 173.4*   Basic Metabolic Panel: Recent Labs  Lab 05/07/20 0514 05/08/20 0215 05/09/20 0407 05/10/20 0408 05/11/20 0639  NA 143 142 141 140 141  K 4.4 3.8 3.8 2.9* 4.0  CL 109 108 109 110 112*  CO2 21* 20* 21* 21* 21*  GLUCOSE 113* 130* 136* 112* 125*  BUN 27* CREATININE 1.52* 1.43* 1.22* 1.18* 1.37*  CALCIUM 9.1 9.4 8.8* 8.5* 8.5*  MG  --  2.1 2.0 1.6* 2.1   Liver Function Tests: Recent Labs  Lab 05/07/20 0514 05/08/20 0215 05/09/20 0407 05/10/20 0408 05/11/20 0639  AST ALT ALKPHOS 82 86 69 67 60  BILITOT 0.9 0.7 0.3 0.5 0.1*  PROT 6.6 6.8 5.8* 5.7* 5.3*  ALBUMIN 3.6 3.5 2.9* 3.1* 2.6*   Recent Labs  Lab 05/06/20 0814  LIPASE 26   No results for input(s): AMMONIA in the last 168 hours. CBC: Recent Labs  Lab 05/07/20 0514 05/08/20 0215 05/09/20 0407 05/10/20 0408 05/11/20 0216  WBC 13.6* 14.0* 13.1* 11.7* 10.8*  NEUTROABS  --  12.3* 10.5* 7.0 7.5  HGB 11.5* 10.8* 9.6* 9.9* 9.4*  HCT 35.1* 33.5* 29.8* 31.0* 29.5*  MCV 94.6 94.9 95.2 96.3 93.9  PLT 251 246 210 225 186   Cardiac Enzymes: No results for input(s): CKTOTAL, CKMB, CKMBINDEX, TROPONINI in the last 168 hours. BNP: Invalid input(s): POCBNP CBG: Recent Labs  Lab 05/07/20 2215  GLUCAP 102*    D-Dimer No results for input(s): DDIMER in the last 72 hours. Hgb A1c No results for input(s): HGBA1C in the last 72 hours. Lipid Profile No results for input(s): CHOL, HDL, LDLCALC, TRIG, CHOLHDL, LDLDIRECT in the last 72 hours. Thyroid function studies Recent Labs    05/10/20 1716  TSH 1.646   Anemia work up No results for input(s): VITAMINB12, FOLATE, FERRITIN, TIBC, IRON, RETICCTPCT in the last 72 hours. Urinalysis    Component Value Date/Time   COLORURINE YELLOW 05/06/2020 1148   APPEARANCEUR HAZY (A) 05/06/2020 1148   LABSPEC >1.030 (H)  05/06/2020 1148   PHURINE 5.5 05/06/2020 1148   GLUCOSEU NEGATIVE 05/06/2020 1148   HGBUR NEGATIVE 05/06/2020 1148   BILIRUBINUR SMALL (A) 05/06/2020 1148   KETONESUR NEGATIVE 05/06/2020 1148   PROTEINUR NEGATIVE 05/06/2020 1148   UROBILINOGEN 1.0 06/24/2010 1850   NITRITE NEGATIVE 05/06/2020 1148   LEUKOCYTESUR NEGATIVE 05/06/2020 1148   Sepsis Labs Invalid input(s): PROCALCITONIN,  WBC,  LACTICIDVEN Microbiology Recent Results (from the past 240 hour(s))  SARS Coronavirus 2 by RT PCR (hospital order, performed in [facility] hospital lab) Nasopharyngeal Nasopharyngeal Swab     Status: None   Collection Time: 05/06/20  1:41 PM   Specimen: Nasopharyngeal Swab  Result Value Ref Range Status   SARS Coronavirus 2 NEGATIVE NEGATIVE Final    Comment: (NOTE) SARS-CoV-2 target nucleic acids are NOT DETECTED.  The SARS-CoV-2 RNA is generally detectable in upper and lower respiratory specimens during the acute phase of infection. The lowest concentration of SARS-CoV-2 viral copies this assay can detect is 250 copies / mL. A negative result does not preclude SARS-CoV-2 infection and should not be used as the sole basis for treatment or other patient management decisions.  A negative result may occur with improper specimen collection / handling, submission of specimen other than nasopharyngeal swab, presence of viral mutation(s) within  the areas targeted by this assay, and inadequate number of viral copies (<250 copies / mL). A negative result must be combined with clinical observations, patient history, and epidemiological information.  Fact Sheet for Patients:   BoilerBrush.com.cy  Fact Sheet for Healthcare Providers: https://pope.com/  This test is not yet approved or  cleared by the [place] FDA and has been authorized for detection and/or diagnosis of SARS-CoV-2 by FDA under an Emergency Use Authorization (EUA).  This EUA will remain in effect (meaning this test can be used) for the duration of the COVID-19 declaration under Section 564(b)(1) of the Act, 21 U.S.C. section 360bbb-3(b)(1), unless the authorization is terminated or revoked sooner.  Performed at [facility], [address] [place], [place] [address]      Time coordinating discharge: 35 minutes  SIGNED:   Glade Lloyd, MD  [facility] 05/11/2020, 9:49 AM

## 2020-05-14 ENCOUNTER — Encounter (HOSPITAL_COMMUNITY): Payer: Self-pay | Admitting: Gastroenterology

## 2020-05-14 LAB — SURGICAL PATHOLOGY

## 2020-05-15 IMAGING — MR MR KNEE*R* W/O CM
4 of 6 series · 19 of 40 positions shown · non-contrast
Comparison: 06/03/2018

CLINICAL DATA: Anterior knee pain since 8047.

EXAM:
MRI OF THE RIGHT KNEE WITHOUT CONTRAST
TECHNIQUE: Multiplanar, multisequence MR imaging of the knee was performed. No
intravenous contrast was administered.

[Series 5: T2 fat-sat · coronal · 4.0mm · 0.29mm/px · 3 of 32 slices shown (1 of 2)]
[im 7/32]
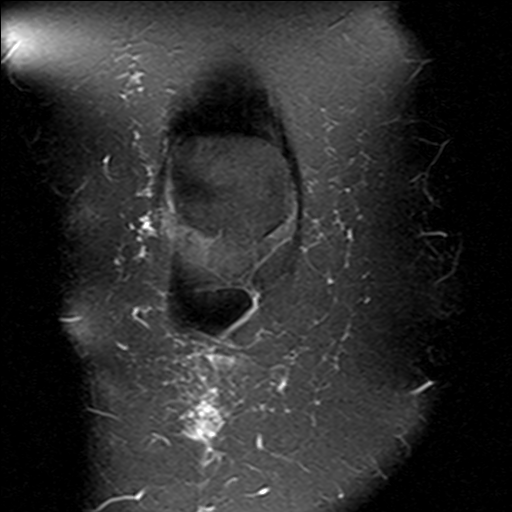
[im 19/32]
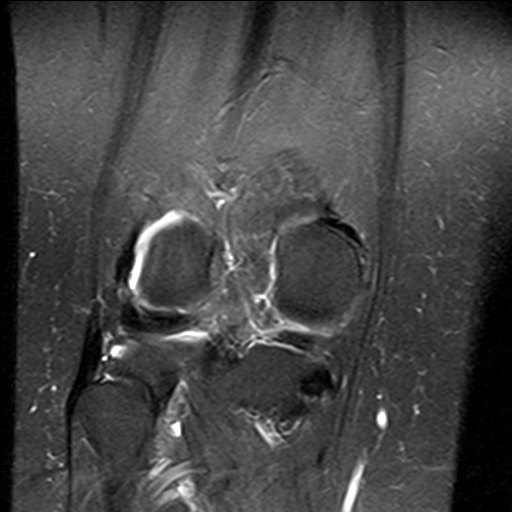
[im 32/32]
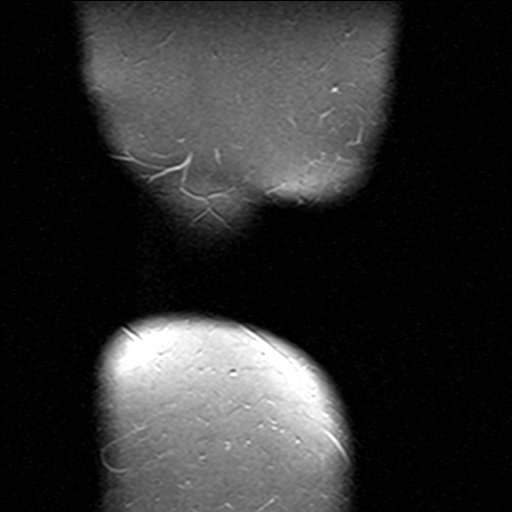

[Series 6: PD fat-sat · coronal · 3.0mm · 0.29mm/px · 8 of 38 slices shown (1 of 2)]
[im 1/38]
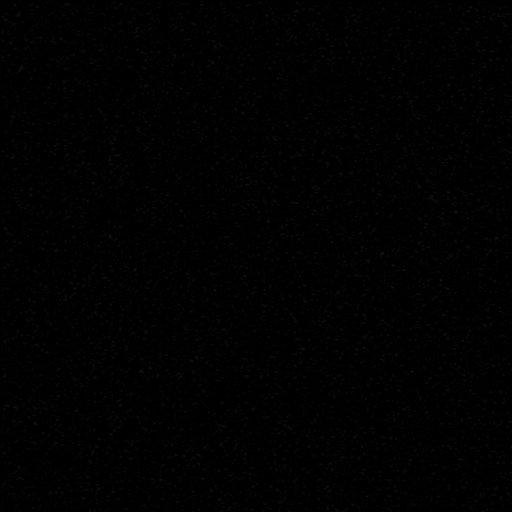
[im 6/38]
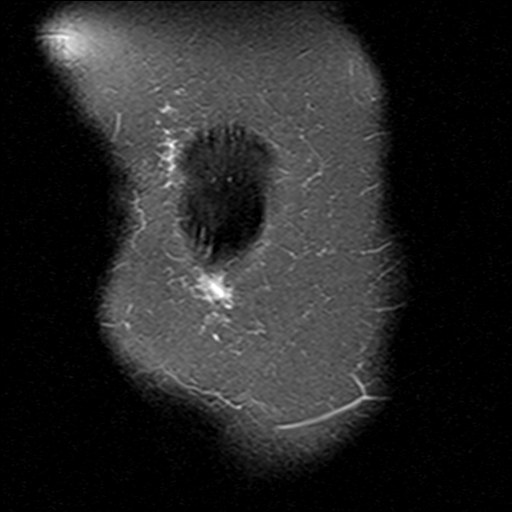
[im 11/38]
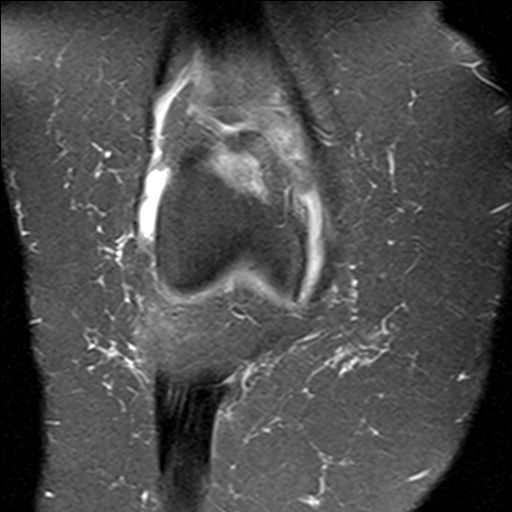
[im 16/38]
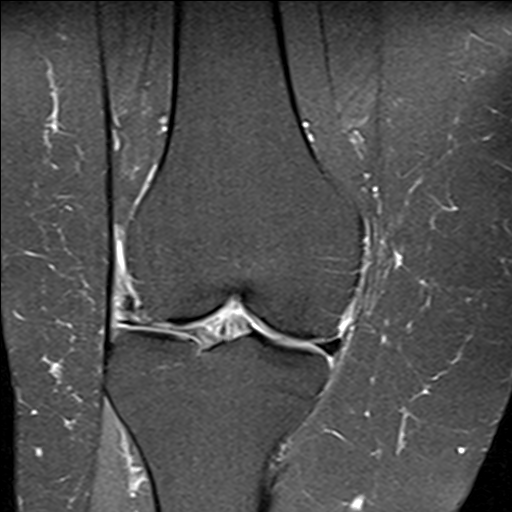
[im 22/38]
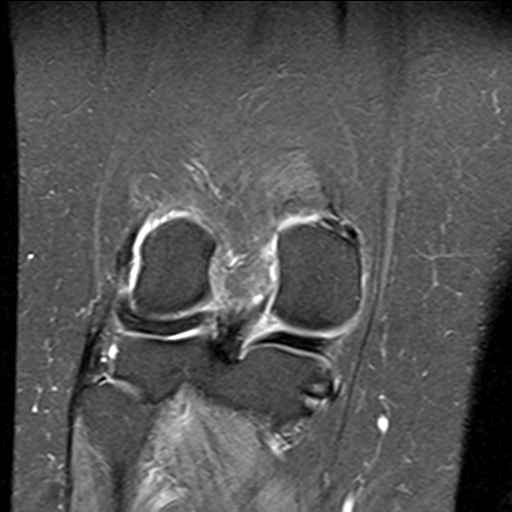
[im 27/38]
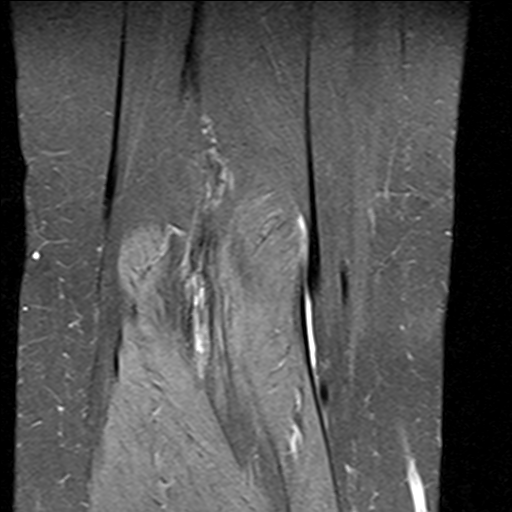
[im 32/38]
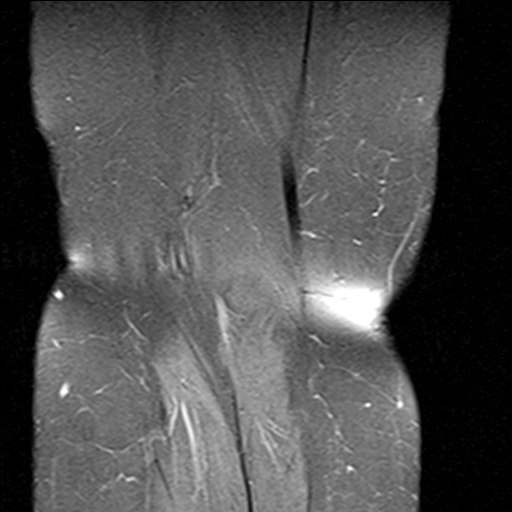
[im 38/38]
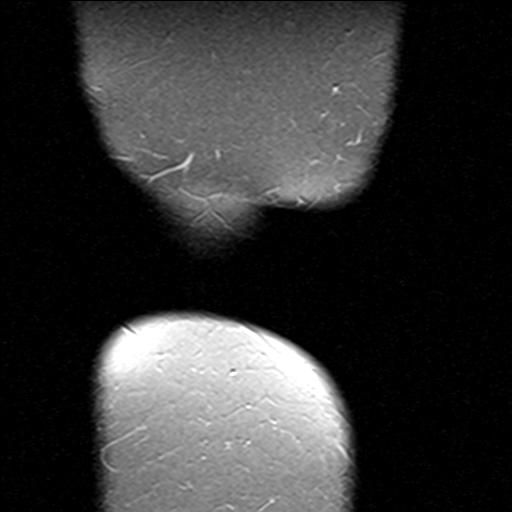

[Series 7: PD fat-sat · sagittal · 3.0mm · 0.29mm/px · 5 of 32 slices shown (2 of 2)]
[im 1/32]
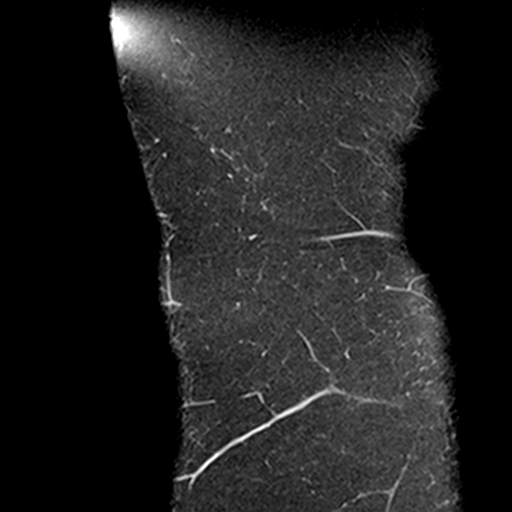
[im 7/32]
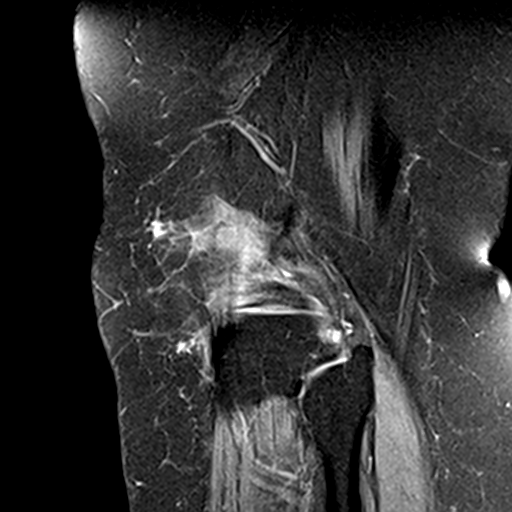
[im 13/32]
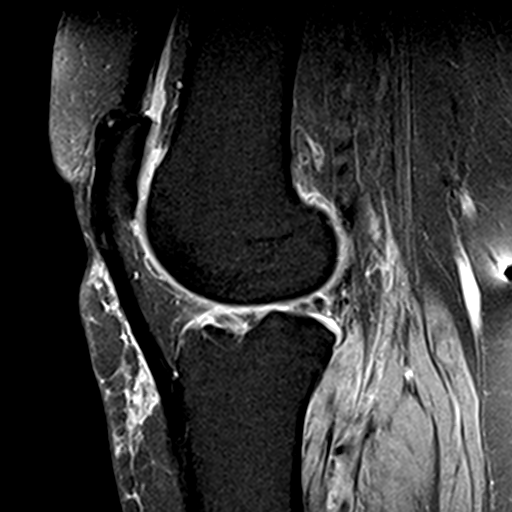
[im 19/32]
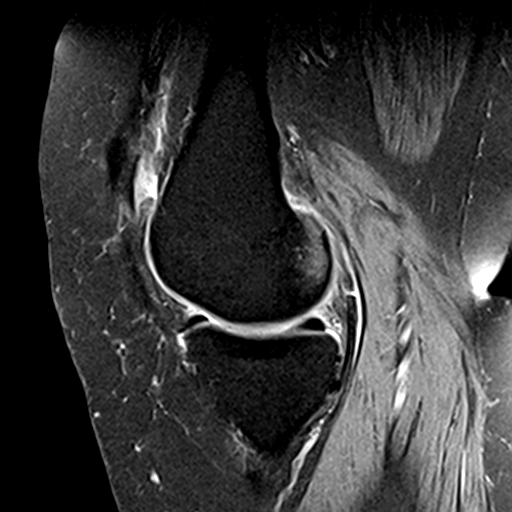
[im 32/32]
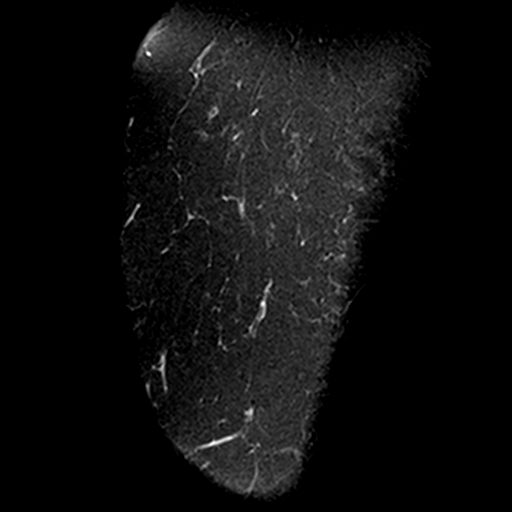

[Series 8: T2 fat-sat · sagittal · 3.0mm · 0.29mm/px · 3 of 32 slices shown (2 of 2)]
[im 7/32]
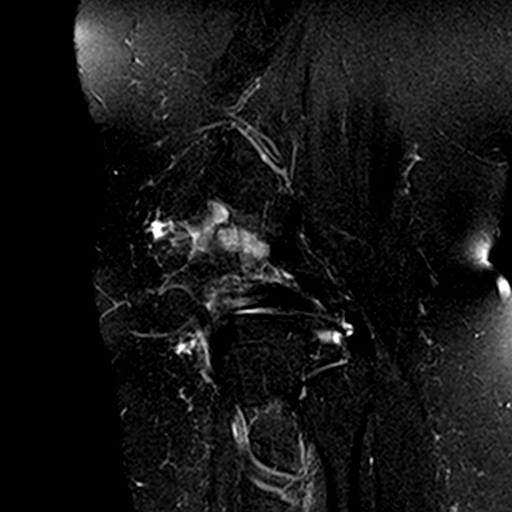
[im 19/32]
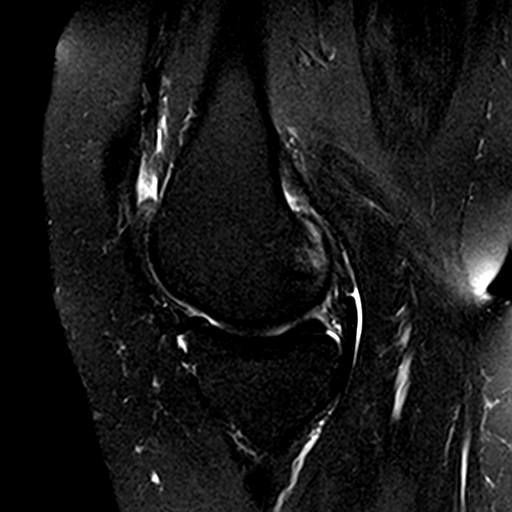
[im 32/32]
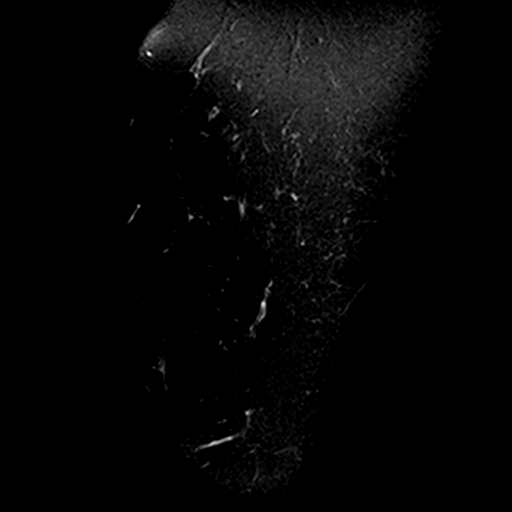

[19 of 40 positions shown; findings below may reference images not displayed]

FINDINGS: MENISCI

Medial meniscus:  Unremarkable

Lateral meniscus: Degenerative tearing in the midbody and anterior
horn lateral meniscus with amorphous accentuated signal involving
especially the superior surface and to a lesser extent the free edge
and inferior surface. Grade 1 signal in the posterior horn lateral
meniscus.

LIGAMENTS

Cruciates:  Unremarkable

Collaterals:  Unremarkable

CARTILAGE

Patellofemoral: Moderate degenerative chondral thinning with mild
marginal spurring.

Medial:  Mild degenerative chondral thinning.

Lateral: Moderate degenerative chondral thinning with chondral
irregularity along the lateral femoral condyle.

Joint:  Small knee effusion.

Popliteal Fossa:  Small Baker's cyst.

Extensor Mechanism:  Unremarkable

Bones:  Spurring of the tibial spine and intercondylar notch.

Other: No supplemental non-categorized findings.
IMPRESSION: 1. Degenerative tearing of the anterior horn and midbody lateral
meniscus.
2. Mild to moderate degrees of degenerative chondral thinning.
Chondral irregularity along the lateral femoral condyle articular
surface.
3. Small knee effusion and small Baker's cyst.

## 2020-05-16 DIAGNOSIS — A0839 Other viral enteritis: Secondary | ICD-10-CM | POA: Diagnosis not present

## 2020-05-16 DIAGNOSIS — B259 Cytomegaloviral disease, unspecified: Secondary | ICD-10-CM | POA: Diagnosis not present

## 2020-05-16 DIAGNOSIS — N1831 Chronic kidney disease, stage 3a: Secondary | ICD-10-CM | POA: Diagnosis not present

## 2020-05-16 DIAGNOSIS — I129 Hypertensive chronic kidney disease with stage 1 through stage 4 chronic kidney disease, or unspecified chronic kidney disease: Secondary | ICD-10-CM | POA: Diagnosis not present

## 2020-05-16 DIAGNOSIS — M069 Rheumatoid arthritis, unspecified: Secondary | ICD-10-CM | POA: Diagnosis not present

## 2020-05-16 DIAGNOSIS — D638 Anemia in other chronic diseases classified elsewhere: Secondary | ICD-10-CM | POA: Diagnosis not present

## 2020-05-18 DIAGNOSIS — K56609 Unspecified intestinal obstruction, unspecified as to partial versus complete obstruction: Secondary | ICD-10-CM | POA: Diagnosis not present

## 2020-05-18 DIAGNOSIS — N1832 Chronic kidney disease, stage 3b: Secondary | ICD-10-CM | POA: Diagnosis not present

## 2020-05-18 DIAGNOSIS — M0579 Rheumatoid arthritis with rheumatoid factor of multiple sites without organ or systems involvement: Secondary | ICD-10-CM | POA: Diagnosis not present

## 2020-05-18 DIAGNOSIS — Z09 Encounter for follow-up examination after completed treatment for conditions other than malignant neoplasm: Secondary | ICD-10-CM | POA: Diagnosis not present

## 2020-05-18 DIAGNOSIS — I471 Supraventricular tachycardia: Secondary | ICD-10-CM | POA: Diagnosis not present

## 2020-05-18 DIAGNOSIS — D72828 Other elevated white blood cell count: Secondary | ICD-10-CM | POA: Diagnosis not present

## 2020-05-23 ENCOUNTER — Other Ambulatory Visit: Payer: Self-pay | Admitting: Gastroenterology

## 2020-05-23 DIAGNOSIS — K5900 Constipation, unspecified: Secondary | ICD-10-CM | POA: Diagnosis not present

## 2020-05-23 DIAGNOSIS — R195 Other fecal abnormalities: Secondary | ICD-10-CM | POA: Diagnosis not present

## 2020-05-23 DIAGNOSIS — K529 Noninfective gastroenteritis and colitis, unspecified: Secondary | ICD-10-CM | POA: Diagnosis not present

## 2020-05-23 DIAGNOSIS — Z86010 Personal history of colonic polyps: Secondary | ICD-10-CM | POA: Diagnosis not present

## 2020-05-23 DIAGNOSIS — K56699 Other intestinal obstruction unspecified as to partial versus complete obstruction: Secondary | ICD-10-CM

## 2020-06-06 DIAGNOSIS — L930 Discoid lupus erythematosus: Secondary | ICD-10-CM | POA: Diagnosis not present

## 2020-06-06 DIAGNOSIS — Z79899 Other long term (current) drug therapy: Secondary | ICD-10-CM | POA: Diagnosis not present

## 2020-06-13 ENCOUNTER — Inpatient Hospital Stay: Admission: RE | Admit: 2020-06-13 | Payer: Medicare Other | Source: Ambulatory Visit

## 2020-06-13 DIAGNOSIS — M47816 Spondylosis without myelopathy or radiculopathy, lumbar region: Secondary | ICD-10-CM | POA: Diagnosis not present

## 2020-06-14 DIAGNOSIS — M0579 Rheumatoid arthritis with rheumatoid factor of multiple sites without organ or systems involvement: Secondary | ICD-10-CM | POA: Diagnosis not present

## 2020-06-14 DIAGNOSIS — I10 Essential (primary) hypertension: Secondary | ICD-10-CM | POA: Diagnosis not present

## 2020-06-14 DIAGNOSIS — N1832 Chronic kidney disease, stage 3b: Secondary | ICD-10-CM | POA: Diagnosis not present

## 2020-06-14 DIAGNOSIS — K219 Gastro-esophageal reflux disease without esophagitis: Secondary | ICD-10-CM | POA: Diagnosis not present

## 2020-06-14 DIAGNOSIS — N3281 Overactive bladder: Secondary | ICD-10-CM | POA: Diagnosis not present

## 2020-06-19 DIAGNOSIS — I10 Essential (primary) hypertension: Secondary | ICD-10-CM | POA: Diagnosis not present

## 2020-06-19 DIAGNOSIS — N3281 Overactive bladder: Secondary | ICD-10-CM | POA: Diagnosis not present

## 2020-06-19 DIAGNOSIS — N398 Other specified disorders of urinary system: Secondary | ICD-10-CM | POA: Diagnosis not present

## 2020-06-21 IMAGING — DX DG CHEST 2V
2 series · 2 of 2 positions shown · non-contrast
Comparison: 04/21/2018

CLINICAL DATA: Body aches.

EXAM:
CHEST - 2 VIEW

[chest pa]
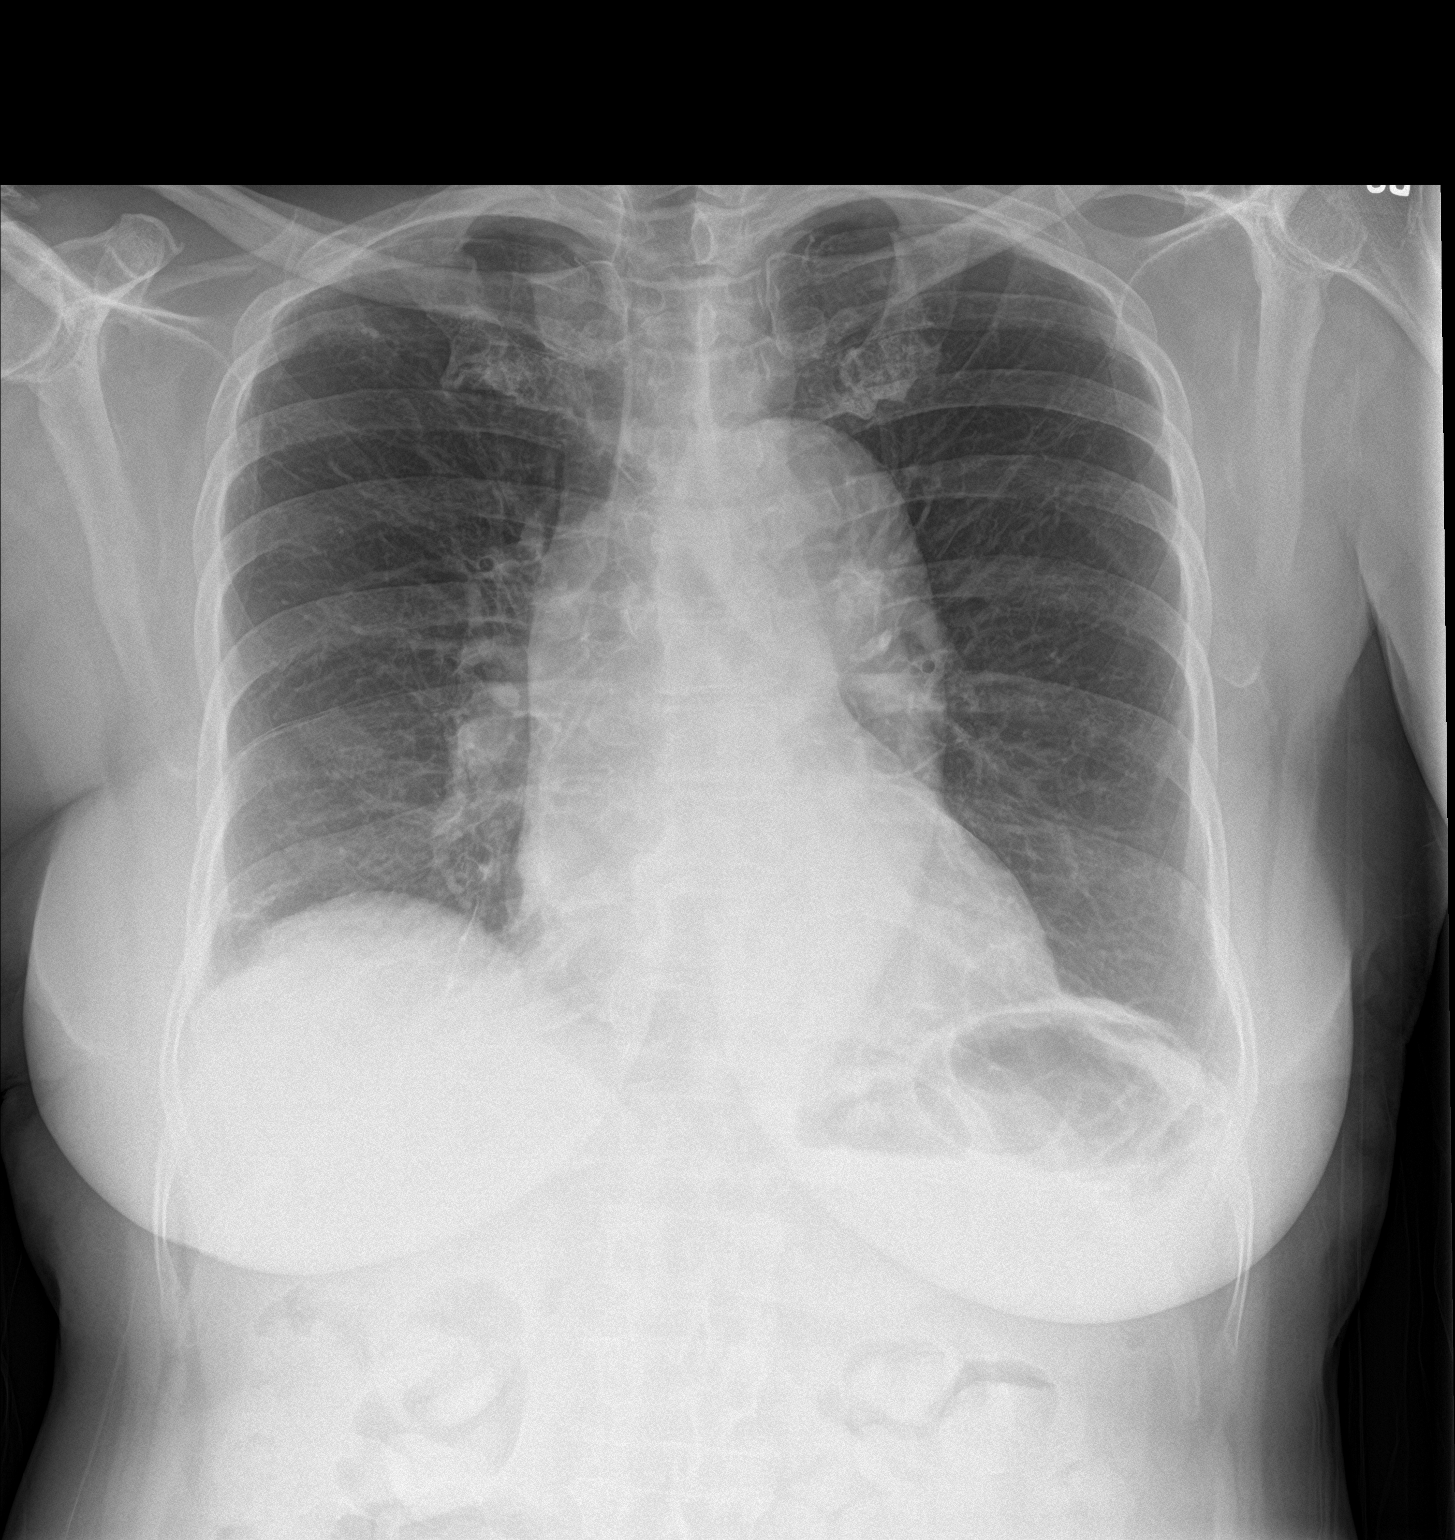

[chest lat]
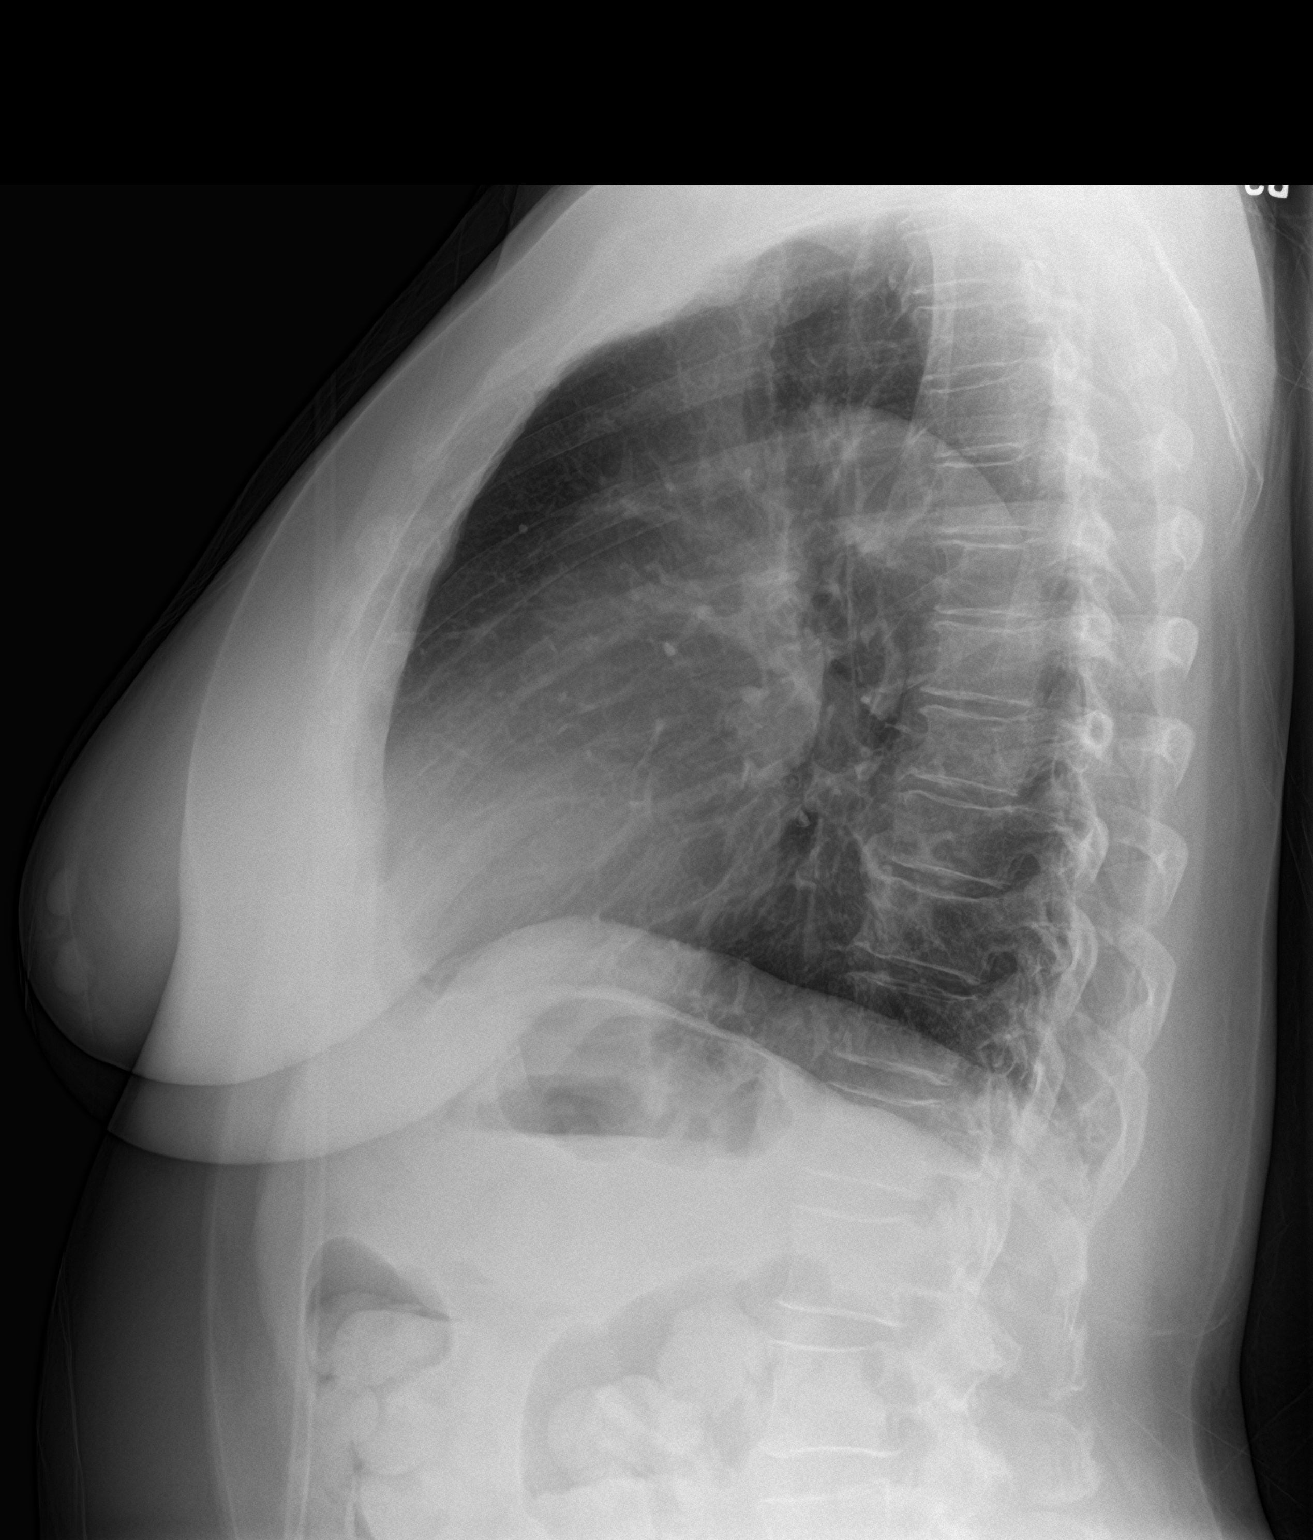

[2 of 2 positions shown; findings below may reference images not displayed]

FINDINGS: Heart size upper limits of normal. The aorta shows tortuosity.
Pulmonary vascularity is normal. The lungs are clear. No effusions.
No significant bone finding.
IMPRESSION: No active disease.  Tortuous aorta.

## 2020-06-22 ENCOUNTER — Ambulatory Visit
Admission: RE | Admit: 2020-06-22 | Discharge: 2020-06-22 | Disposition: A | Discharge status: Home / Self Care | Payer: Medicare Other | Source: Ambulatory Visit | Attending: Gastroenterology | Admitting: Gastroenterology

## 2020-06-22 DIAGNOSIS — K56699 Other intestinal obstruction unspecified as to partial versus complete obstruction: Secondary | ICD-10-CM

## 2020-06-22 DIAGNOSIS — K5900 Constipation, unspecified: Secondary | ICD-10-CM

## 2020-06-22 DIAGNOSIS — M47814 Spondylosis without myelopathy or radiculopathy, thoracic region: Secondary | ICD-10-CM | POA: Diagnosis not present

## 2020-06-22 DIAGNOSIS — I700 Atherosclerosis of aorta: Secondary | ICD-10-CM | POA: Diagnosis not present

## 2020-06-22 DIAGNOSIS — K5730 Diverticulosis of large intestine without perforation or abscess without bleeding: Secondary | ICD-10-CM | POA: Diagnosis not present

## 2020-06-22 DIAGNOSIS — K529 Noninfective gastroenteritis and colitis, unspecified: Secondary | ICD-10-CM | POA: Diagnosis not present

## 2020-06-26 DIAGNOSIS — I712 Thoracic aortic aneurysm, without rupture: Secondary | ICD-10-CM | POA: Diagnosis not present

## 2020-06-26 DIAGNOSIS — N3281 Overactive bladder: Secondary | ICD-10-CM | POA: Diagnosis not present

## 2020-06-26 DIAGNOSIS — I10 Essential (primary) hypertension: Secondary | ICD-10-CM | POA: Diagnosis not present

## 2020-07-11 DIAGNOSIS — Z8673 Personal history of transient ischemic attack (TIA), and cerebral infarction without residual deficits: Secondary | ICD-10-CM | POA: Diagnosis not present

## 2020-07-11 DIAGNOSIS — K5900 Constipation, unspecified: Secondary | ICD-10-CM | POA: Diagnosis not present

## 2020-07-11 DIAGNOSIS — K56699 Other intestinal obstruction unspecified as to partial versus complete obstruction: Secondary | ICD-10-CM | POA: Diagnosis not present

## 2020-07-11 DIAGNOSIS — Z86010 Personal history of colonic polyps: Secondary | ICD-10-CM | POA: Diagnosis not present

## 2020-07-11 DIAGNOSIS — K529 Noninfective gastroenteritis and colitis, unspecified: Secondary | ICD-10-CM | POA: Diagnosis not present

## 2020-07-16 DIAGNOSIS — N3281 Overactive bladder: Secondary | ICD-10-CM | POA: Diagnosis not present

## 2020-07-23 DIAGNOSIS — I10 Essential (primary) hypertension: Secondary | ICD-10-CM | POA: Diagnosis not present

## 2020-07-23 DIAGNOSIS — Z9181 History of falling: Secondary | ICD-10-CM | POA: Diagnosis not present

## 2020-07-23 DIAGNOSIS — Z8673 Personal history of transient ischemic attack (TIA), and cerebral infarction without residual deficits: Secondary | ICD-10-CM | POA: Diagnosis not present

## 2020-07-23 DIAGNOSIS — N1832 Chronic kidney disease, stage 3b: Secondary | ICD-10-CM | POA: Diagnosis not present

## 2020-07-23 DIAGNOSIS — I951 Orthostatic hypotension: Secondary | ICD-10-CM | POA: Diagnosis not present

## 2020-07-23 DIAGNOSIS — R42 Dizziness and giddiness: Secondary | ICD-10-CM | POA: Diagnosis not present

## 2020-07-25 DIAGNOSIS — N3281 Overactive bladder: Secondary | ICD-10-CM | POA: Diagnosis not present

## 2020-07-25 DIAGNOSIS — M3500 Sicca syndrome, unspecified: Secondary | ICD-10-CM | POA: Diagnosis not present

## 2020-07-25 DIAGNOSIS — I444 Left anterior fascicular block: Secondary | ICD-10-CM | POA: Diagnosis not present

## 2020-07-25 DIAGNOSIS — I471 Supraventricular tachycardia: Secondary | ICD-10-CM | POA: Diagnosis not present

## 2020-07-25 DIAGNOSIS — R339 Retention of urine, unspecified: Secondary | ICD-10-CM | POA: Diagnosis not present

## 2020-07-25 DIAGNOSIS — M1711 Unilateral primary osteoarthritis, right knee: Secondary | ICD-10-CM | POA: Diagnosis not present

## 2020-07-25 DIAGNOSIS — I499 Cardiac arrhythmia, unspecified: Secondary | ICD-10-CM | POA: Diagnosis not present

## 2020-07-25 DIAGNOSIS — G8929 Other chronic pain: Secondary | ICD-10-CM | POA: Diagnosis not present

## 2020-07-25 DIAGNOSIS — N183 Chronic kidney disease, stage 3 unspecified: Secondary | ICD-10-CM | POA: Diagnosis not present

## 2020-07-25 DIAGNOSIS — I480 Paroxysmal atrial fibrillation: Secondary | ICD-10-CM | POA: Diagnosis not present

## 2020-07-25 DIAGNOSIS — K219 Gastro-esophageal reflux disease without esophagitis: Secondary | ICD-10-CM | POA: Diagnosis not present

## 2020-07-25 DIAGNOSIS — G4700 Insomnia, unspecified: Secondary | ICD-10-CM | POA: Diagnosis not present

## 2020-07-25 DIAGNOSIS — I129 Hypertensive chronic kidney disease with stage 1 through stage 4 chronic kidney disease, or unspecified chronic kidney disease: Secondary | ICD-10-CM | POA: Diagnosis not present

## 2020-07-25 DIAGNOSIS — I2510 Atherosclerotic heart disease of native coronary artery without angina pectoris: Secondary | ICD-10-CM | POA: Diagnosis not present

## 2020-07-25 DIAGNOSIS — N179 Acute kidney failure, unspecified: Secondary | ICD-10-CM | POA: Diagnosis not present

## 2020-07-25 DIAGNOSIS — E782 Mixed hyperlipidemia: Secondary | ICD-10-CM | POA: Diagnosis not present

## 2020-07-25 DIAGNOSIS — M069 Rheumatoid arthritis, unspecified: Secondary | ICD-10-CM | POA: Diagnosis not present

## 2020-07-25 DIAGNOSIS — Z23 Encounter for immunization: Secondary | ICD-10-CM | POA: Diagnosis not present

## 2020-07-25 DIAGNOSIS — Z66 Do not resuscitate: Secondary | ICD-10-CM | POA: Diagnosis not present

## 2020-07-25 DIAGNOSIS — R2681 Unsteadiness on feet: Secondary | ICD-10-CM | POA: Diagnosis not present

## 2020-07-25 DIAGNOSIS — M8589 Other specified disorders of bone density and structure, multiple sites: Secondary | ICD-10-CM | POA: Diagnosis not present

## 2020-07-26 DIAGNOSIS — N179 Acute kidney failure, unspecified: Secondary | ICD-10-CM | POA: Diagnosis not present

## 2020-07-26 DIAGNOSIS — N189 Chronic kidney disease, unspecified: Secondary | ICD-10-CM | POA: Diagnosis not present

## 2020-07-26 DIAGNOSIS — I129 Hypertensive chronic kidney disease with stage 1 through stage 4 chronic kidney disease, or unspecified chronic kidney disease: Secondary | ICD-10-CM | POA: Diagnosis not present

## 2020-07-26 DIAGNOSIS — I480 Paroxysmal atrial fibrillation: Secondary | ICD-10-CM | POA: Diagnosis not present

## 2020-07-26 DIAGNOSIS — G8929 Other chronic pain: Secondary | ICD-10-CM | POA: Diagnosis not present

## 2020-07-27 DIAGNOSIS — I129 Hypertensive chronic kidney disease with stage 1 through stage 4 chronic kidney disease, or unspecified chronic kidney disease: Secondary | ICD-10-CM | POA: Diagnosis not present

## 2020-07-27 DIAGNOSIS — I480 Paroxysmal atrial fibrillation: Secondary | ICD-10-CM | POA: Diagnosis not present

## 2020-07-27 DIAGNOSIS — G8929 Other chronic pain: Secondary | ICD-10-CM | POA: Diagnosis not present

## 2020-07-27 DIAGNOSIS — N189 Chronic kidney disease, unspecified: Secondary | ICD-10-CM | POA: Diagnosis not present

## 2020-07-27 DIAGNOSIS — N179 Acute kidney failure, unspecified: Secondary | ICD-10-CM | POA: Diagnosis not present

## 2020-07-28 DIAGNOSIS — I129 Hypertensive chronic kidney disease with stage 1 through stage 4 chronic kidney disease, or unspecified chronic kidney disease: Secondary | ICD-10-CM | POA: Diagnosis not present

## 2020-07-28 DIAGNOSIS — N189 Chronic kidney disease, unspecified: Secondary | ICD-10-CM | POA: Diagnosis not present

## 2020-07-28 DIAGNOSIS — N179 Acute kidney failure, unspecified: Secondary | ICD-10-CM | POA: Diagnosis not present

## 2020-08-03 DIAGNOSIS — M069 Rheumatoid arthritis, unspecified: Secondary | ICD-10-CM | POA: Diagnosis not present

## 2020-08-03 DIAGNOSIS — N1831 Chronic kidney disease, stage 3a: Secondary | ICD-10-CM | POA: Diagnosis not present

## 2020-08-03 DIAGNOSIS — I471 Supraventricular tachycardia: Secondary | ICD-10-CM | POA: Diagnosis not present

## 2020-08-03 DIAGNOSIS — N3281 Overactive bladder: Secondary | ICD-10-CM | POA: Diagnosis not present

## 2020-08-03 DIAGNOSIS — I129 Hypertensive chronic kidney disease with stage 1 through stage 4 chronic kidney disease, or unspecified chronic kidney disease: Secondary | ICD-10-CM | POA: Diagnosis not present

## 2020-08-06 DIAGNOSIS — N1831 Chronic kidney disease, stage 3a: Secondary | ICD-10-CM | POA: Diagnosis not present

## 2020-08-06 DIAGNOSIS — N390 Urinary tract infection, site not specified: Secondary | ICD-10-CM | POA: Diagnosis not present

## 2020-08-07 DIAGNOSIS — M150 Primary generalized (osteo)arthritis: Secondary | ICD-10-CM | POA: Diagnosis not present

## 2020-08-07 DIAGNOSIS — R768 Other specified abnormal immunological findings in serum: Secondary | ICD-10-CM | POA: Diagnosis not present

## 2020-08-07 DIAGNOSIS — M25561 Pain in right knee: Secondary | ICD-10-CM | POA: Diagnosis not present

## 2020-08-07 DIAGNOSIS — M0579 Rheumatoid arthritis with rheumatoid factor of multiple sites without organ or systems involvement: Secondary | ICD-10-CM | POA: Diagnosis not present

## 2020-08-07 DIAGNOSIS — N1832 Chronic kidney disease, stage 3b: Secondary | ICD-10-CM | POA: Diagnosis not present

## 2020-08-22 DIAGNOSIS — Z09 Encounter for follow-up examination after completed treatment for conditions other than malignant neoplasm: Secondary | ICD-10-CM | POA: Diagnosis not present

## 2020-08-22 DIAGNOSIS — N1832 Chronic kidney disease, stage 3b: Secondary | ICD-10-CM | POA: Diagnosis not present

## 2020-08-22 DIAGNOSIS — N179 Acute kidney failure, unspecified: Secondary | ICD-10-CM | POA: Diagnosis not present

## 2020-08-22 DIAGNOSIS — I10 Essential (primary) hypertension: Secondary | ICD-10-CM | POA: Diagnosis not present

## 2020-08-22 DIAGNOSIS — D631 Anemia in chronic kidney disease: Secondary | ICD-10-CM | POA: Diagnosis not present

## 2020-09-05 DIAGNOSIS — M4316 Spondylolisthesis, lumbar region: Secondary | ICD-10-CM | POA: Diagnosis not present

## 2020-09-05 DIAGNOSIS — R109 Unspecified abdominal pain: Secondary | ICD-10-CM | POA: Diagnosis not present

## 2020-09-05 DIAGNOSIS — K5730 Diverticulosis of large intestine without perforation or abscess without bleeding: Secondary | ICD-10-CM | POA: Diagnosis not present

## 2020-09-05 DIAGNOSIS — R1032 Left lower quadrant pain: Secondary | ICD-10-CM | POA: Diagnosis not present

## 2020-09-05 DIAGNOSIS — N189 Chronic kidney disease, unspecified: Secondary | ICD-10-CM | POA: Diagnosis not present

## 2020-09-05 DIAGNOSIS — R079 Chest pain, unspecified: Secondary | ICD-10-CM | POA: Diagnosis not present

## 2020-09-05 DIAGNOSIS — M3500 Sicca syndrome, unspecified: Secondary | ICD-10-CM | POA: Diagnosis not present

## 2020-09-05 DIAGNOSIS — M546 Pain in thoracic spine: Secondary | ICD-10-CM | POA: Diagnosis not present

## 2020-09-05 DIAGNOSIS — R1110 Vomiting, unspecified: Secondary | ICD-10-CM | POA: Diagnosis not present

## 2020-09-05 DIAGNOSIS — R29890 Loss of height: Secondary | ICD-10-CM | POA: Diagnosis not present

## 2020-09-26 ENCOUNTER — Emergency Department (HOSPITAL_COMMUNITY)
Admission: EM | Admit: 2020-09-26 | Discharge: 2020-09-26 | Disposition: A | Discharge status: Home / Self Care | Payer: Medicare Other | Attending: Emergency Medicine | Admitting: Emergency Medicine

## 2020-09-26 ENCOUNTER — Other Ambulatory Visit: Payer: Self-pay

## 2020-09-26 ENCOUNTER — Emergency Department (HOSPITAL_COMMUNITY): Payer: Medicare Other

## 2020-09-26 DIAGNOSIS — Z5321 Procedure and treatment not carried out due to patient leaving prior to being seen by health care provider: Secondary | ICD-10-CM | POA: Insufficient documentation

## 2020-09-26 DIAGNOSIS — R0602 Shortness of breath: Secondary | ICD-10-CM | POA: Insufficient documentation

## 2020-09-26 DIAGNOSIS — R350 Frequency of micturition: Secondary | ICD-10-CM | POA: Insufficient documentation

## 2020-09-26 DIAGNOSIS — S32040D Wedge compression fracture of fourth lumbar vertebra, subsequent encounter for fracture with routine healing: Secondary | ICD-10-CM | POA: Diagnosis not present

## 2020-09-26 DIAGNOSIS — I10 Essential (primary) hypertension: Secondary | ICD-10-CM | POA: Diagnosis not present

## 2020-09-26 DIAGNOSIS — R000 Tachycardia, unspecified: Secondary | ICD-10-CM | POA: Diagnosis not present

## 2020-09-26 DIAGNOSIS — R0600 Dyspnea, unspecified: Secondary | ICD-10-CM | POA: Diagnosis not present

## 2020-09-26 DIAGNOSIS — N1832 Chronic kidney disease, stage 3b: Secondary | ICD-10-CM | POA: Diagnosis not present

## 2020-09-26 LAB — BASIC METABOLIC PANEL
Anion gap: 14 (ref 5–15)
BUN: 37 mg/dL — ABNORMAL HIGH (ref 8–23)
CO2: 24 mmol/L (ref 22–32)
Calcium: 9.6 mg/dL (ref 8.9–10.3)
Chloride: 104 mmol/L (ref 98–111)
Creatinine, Ser: 1.84 mg/dL — ABNORMAL HIGH (ref 0.44–1.00)
GFR, Estimated: 30 mL/min — ABNORMAL LOW (ref >60)
Glucose, Bld: 122 mg/dL — ABNORMAL HIGH (ref 70–99)
Potassium: 4.9 mmol/L (ref 3.5–5.1)
Sodium: 142 mmol/L (ref 135–145)

## 2020-09-26 LAB — CBC
HCT: 39.9 % (ref 36.0–46.0)
Hemoglobin: 12.6 g/dL (ref 12.0–15.0)
MCH: 30.4 pg (ref 26.0–34.0)
MCHC: 31.6 g/dL (ref 30.0–36.0)
MCV: 96.1 fL (ref 80.0–100.0)
Platelets: 227 10*3/uL (ref 150–400)
RBC: 4.15 MIL/uL (ref 3.87–5.11)
RDW: 13.9 % (ref 11.5–15.5)
WBC: 9.8 10*3/uL (ref 4.0–10.5)
nRBC: 0 % (ref 0.0–0.2)

## 2020-09-26 NOTE — ED Triage Notes (Signed)
Pt sent by PCP for further eval of shob both at rest and with exertion x 2 weeks. Also endorses urinary frequency at night.

## 2020-09-26 NOTE — ED Notes (Signed)
Pt called for VS recheck, no response  

## 2020-09-26 NOTE — ED Notes (Signed)
DaughterSheralyn [phone]

## 2020-09-28 DIAGNOSIS — R0602 Shortness of breath: Secondary | ICD-10-CM | POA: Diagnosis not present

## 2020-10-03 DIAGNOSIS — M47816 Spondylosis without myelopathy or radiculopathy, lumbar region: Secondary | ICD-10-CM | POA: Diagnosis not present

## 2020-10-03 DIAGNOSIS — I10 Essential (primary) hypertension: Secondary | ICD-10-CM | POA: Diagnosis not present

## 2020-10-04 DIAGNOSIS — D638 Anemia in other chronic diseases classified elsewhere: Secondary | ICD-10-CM | POA: Diagnosis not present

## 2020-10-04 DIAGNOSIS — N1832 Chronic kidney disease, stage 3b: Secondary | ICD-10-CM | POA: Diagnosis not present

## 2020-10-04 DIAGNOSIS — N3281 Overactive bladder: Secondary | ICD-10-CM | POA: Diagnosis not present

## 2020-10-04 DIAGNOSIS — I129 Hypertensive chronic kidney disease with stage 1 through stage 4 chronic kidney disease, or unspecified chronic kidney disease: Secondary | ICD-10-CM | POA: Diagnosis not present

## 2020-10-04 DIAGNOSIS — A0839 Other viral enteritis: Secondary | ICD-10-CM | POA: Diagnosis not present

## 2020-10-04 DIAGNOSIS — M47816 Spondylosis without myelopathy or radiculopathy, lumbar region: Secondary | ICD-10-CM | POA: Diagnosis not present

## 2020-10-12 DIAGNOSIS — R0602 Shortness of breath: Secondary | ICD-10-CM | POA: Diagnosis not present

## 2020-10-12 DIAGNOSIS — G479 Sleep disorder, unspecified: Secondary | ICD-10-CM | POA: Diagnosis not present

## 2020-10-12 DIAGNOSIS — I10 Essential (primary) hypertension: Secondary | ICD-10-CM | POA: Diagnosis not present

## 2020-10-22 IMAGING — DX PORTABLE ABDOMEN - 1 VIEW
1 series · 1 of 1 positions shown · non-contrast
Comparison: 08/10/2017.

CLINICAL DATA: Abdominal pain.  Diverticulitis.

EXAM:
PORTABLE ABDOMEN - 1 VIEW

[abdomen kub]
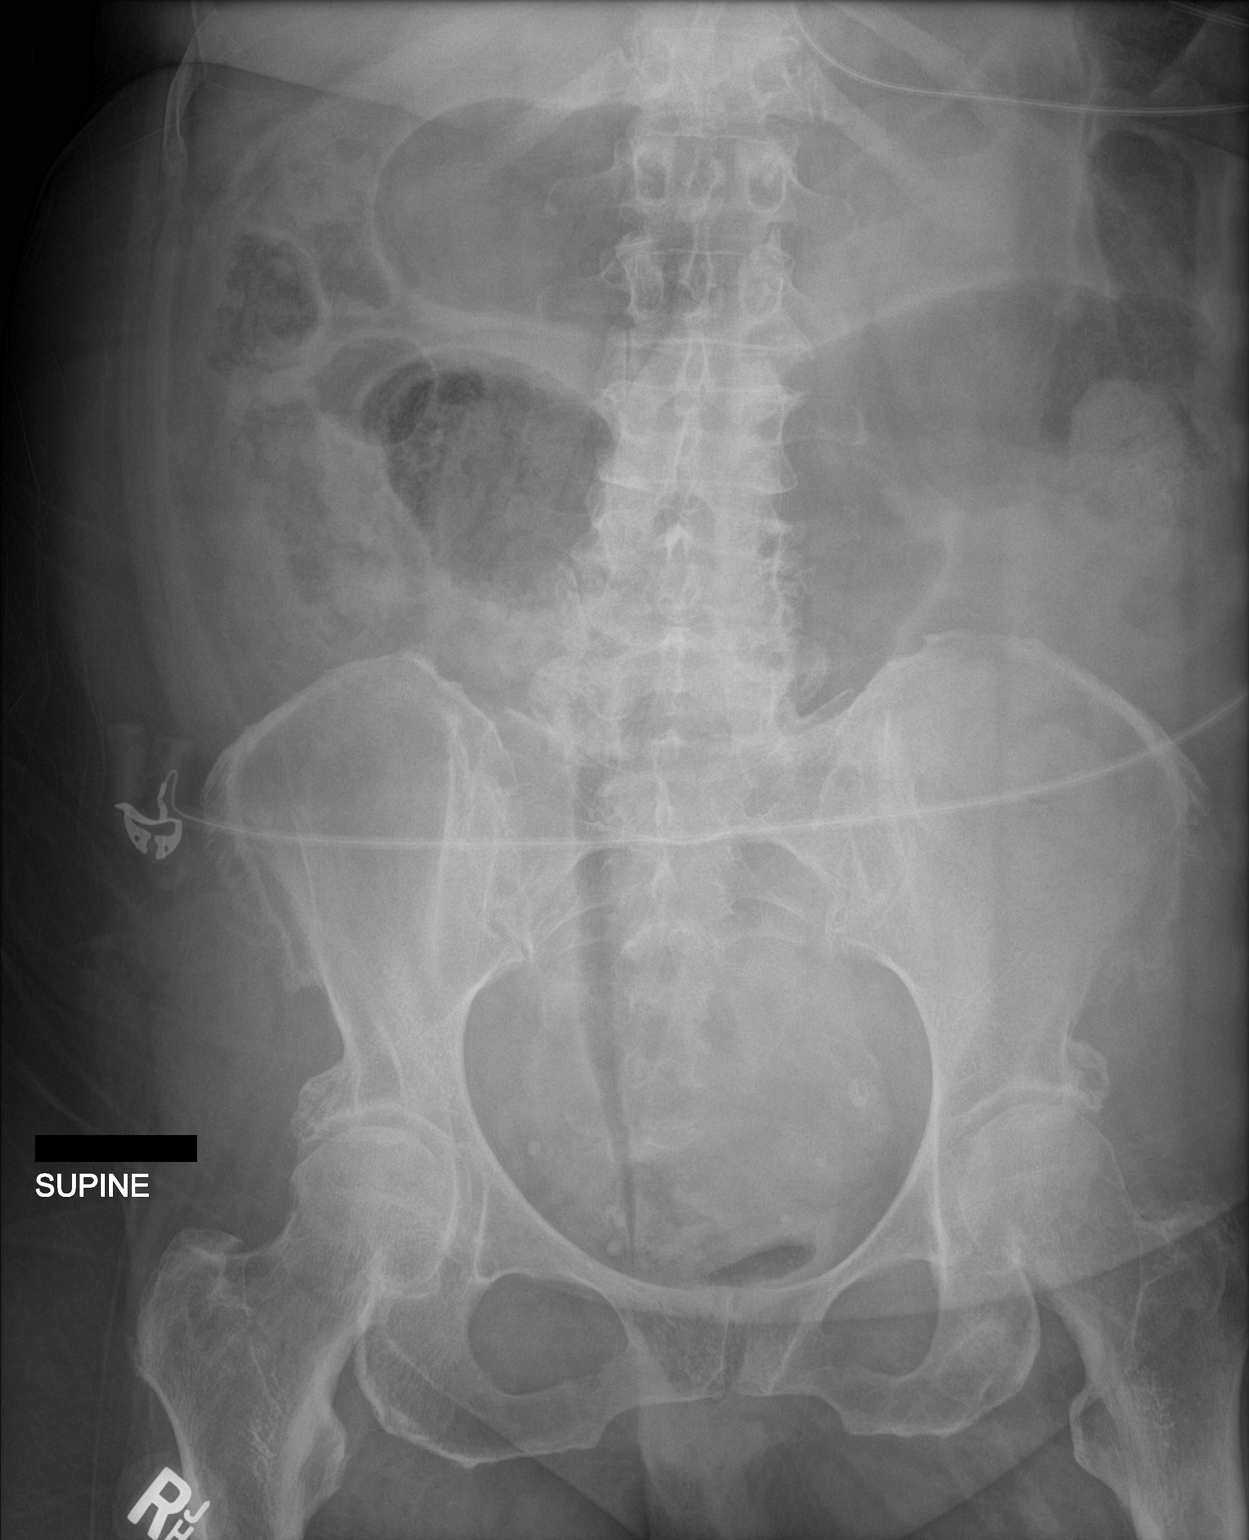

[1 of 1 positions shown; findings below may reference images not displayed]

FINDINGS: There is a large amount of stool in the colon. There is some gaseous
distension of both the colon and several small bowel loops.
Phleboliths project over the patient's pelvis. There is no frank
evidence of a high-grade small bowel obstruction, however the
entirety abdomen is not visualized on this exam. There is a linear
lucency coursing through the patient's pelvis that is favored to
represent artifact.
IMPRESSION: 1. Large amount of stool throughout the colon.
2. Mild gaseous distention of loops of colon and small bowel without
evidence of a high-grade obstruction.
3. Linear lucency coursing through the pelvis favored to represent
artifact, less likely free air. If there is high clinical suspicion
for an acute intra-abdominal process short interval follow-up
radiograph is recommended to confirm resolution of this finding.

## 2020-10-22 IMAGING — US US RENAL
1 series · 14 of 25 positions shown · non-contrast
Comparison: CT scan 03/19/2017

CLINICAL DATA: Chronic kidney disease, stage III

EXAM:
RENAL / URINARY TRACT ULTRASOUND COMPLETE

[Series 1: us renal · 14 of 42 slices shown]
[im 1/42]
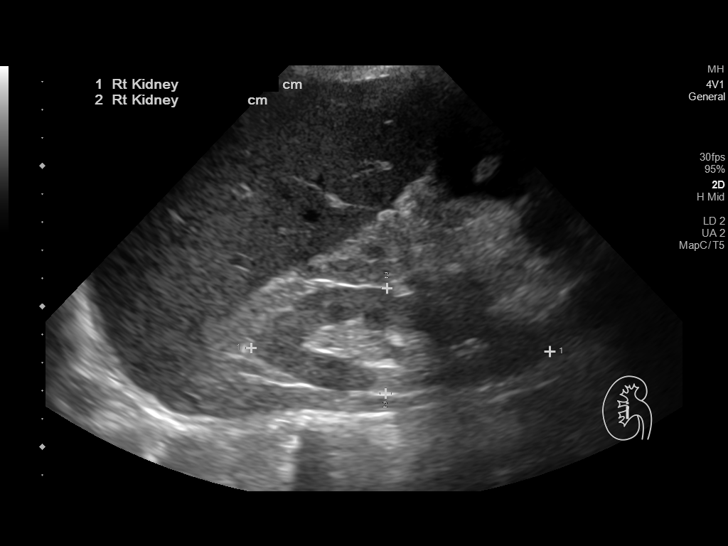
[im 4/42]
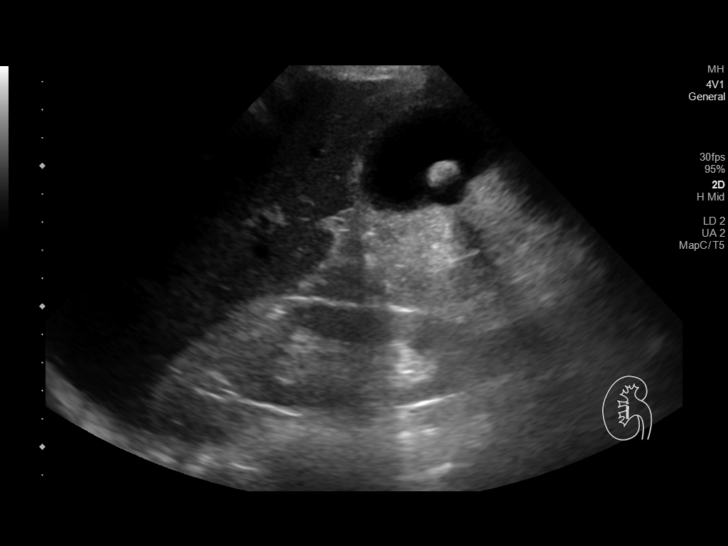
[im 7/42]
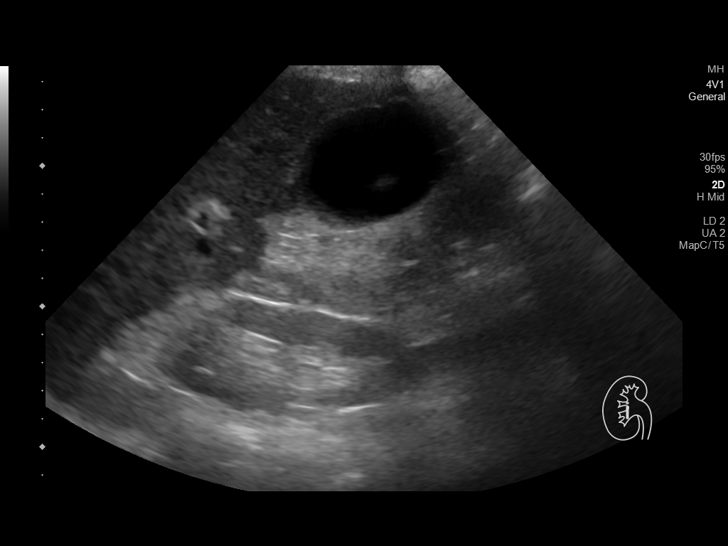
[im 11/42]
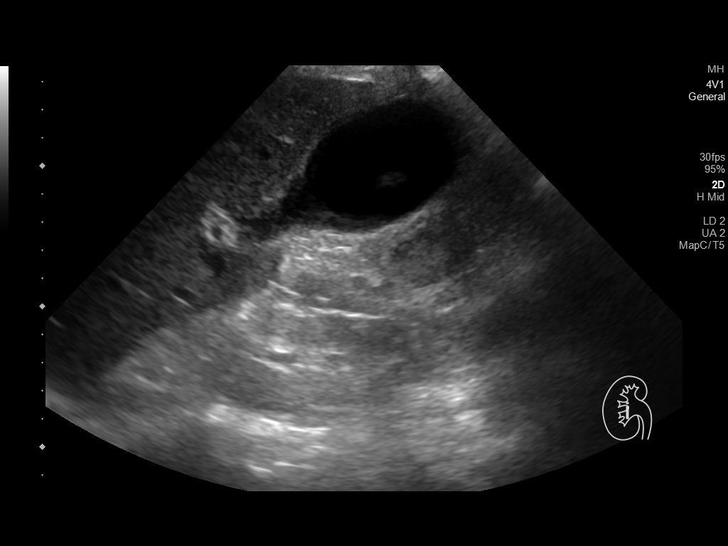
[im 14/42]
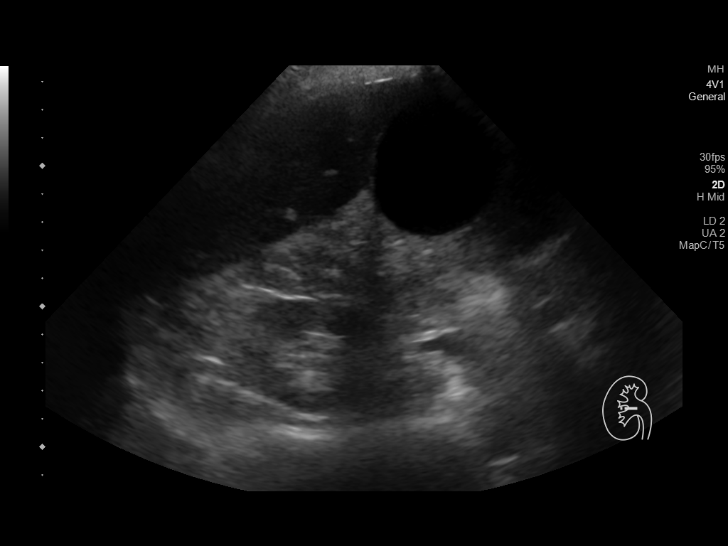
[im 16/42]
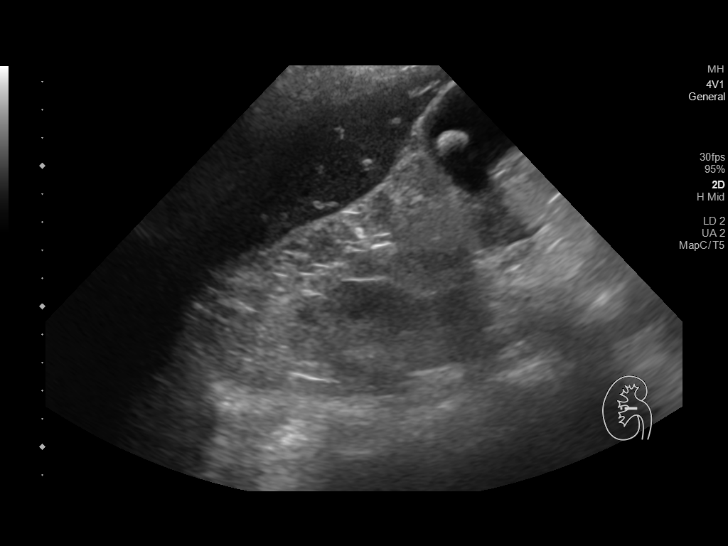
[im 19/42]
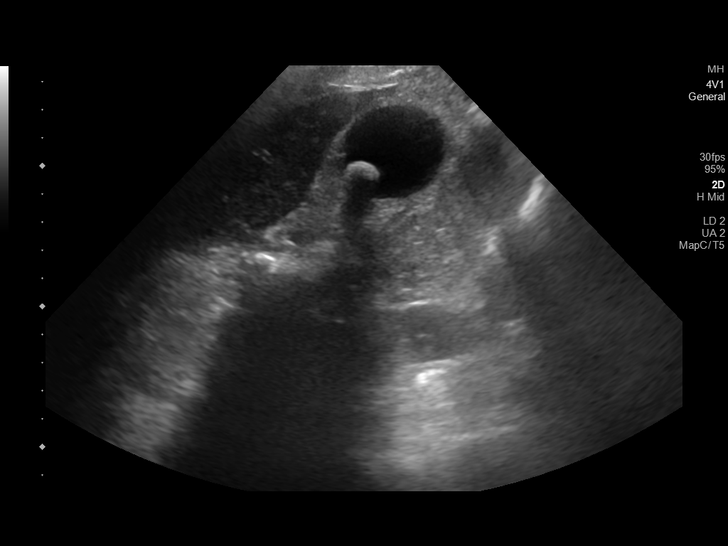
[im 23/42]
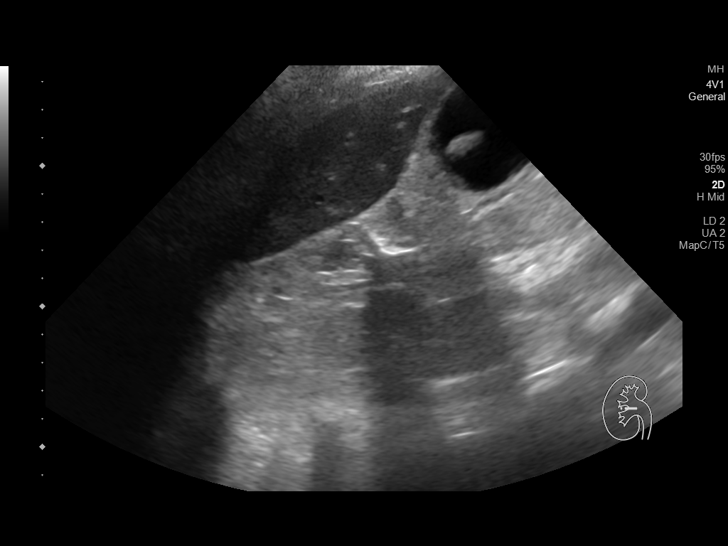
[im 26/42]
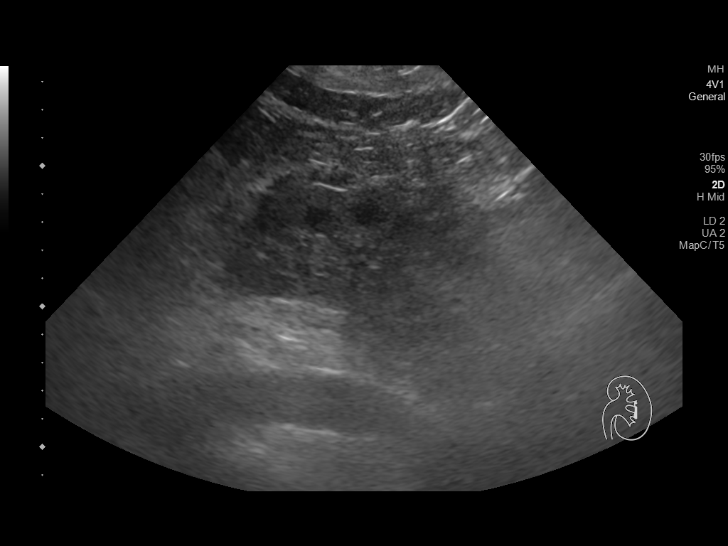
[im 28/42]
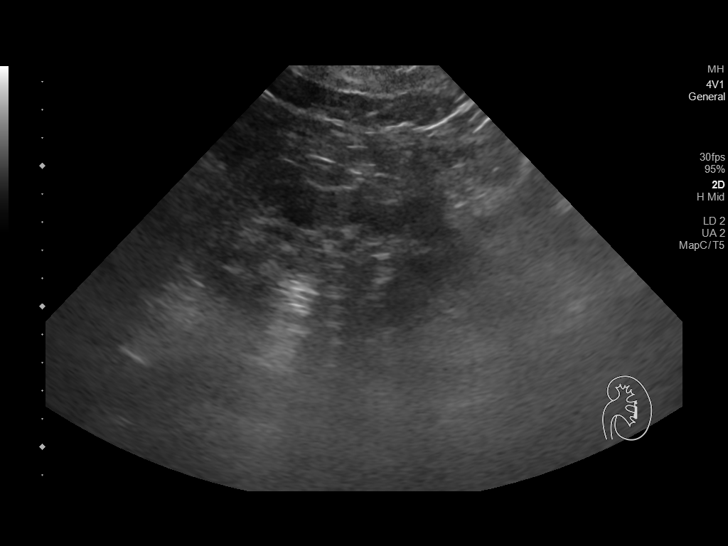
[im 31/42]
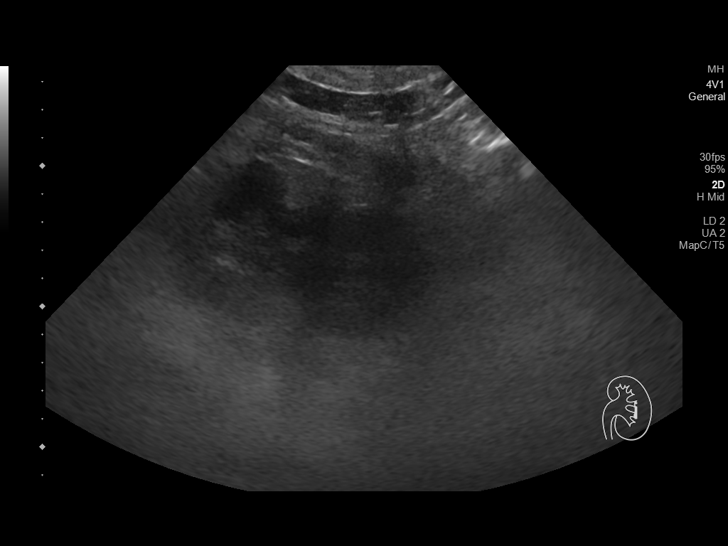
[im 35/42]
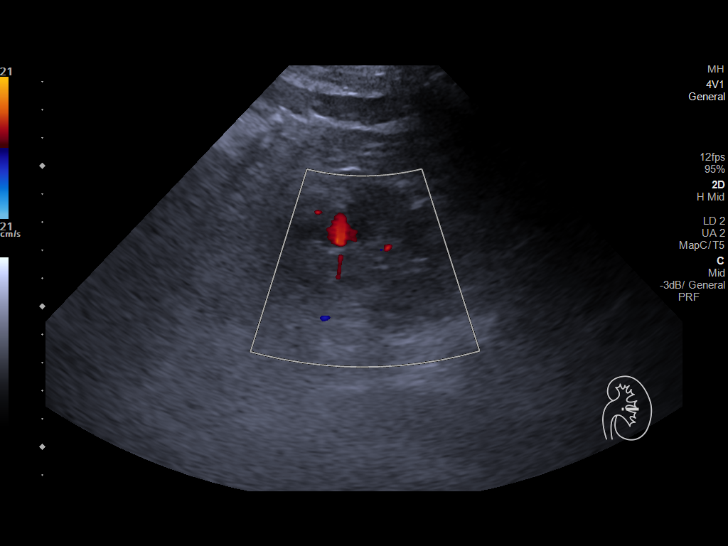
[im 38/42]
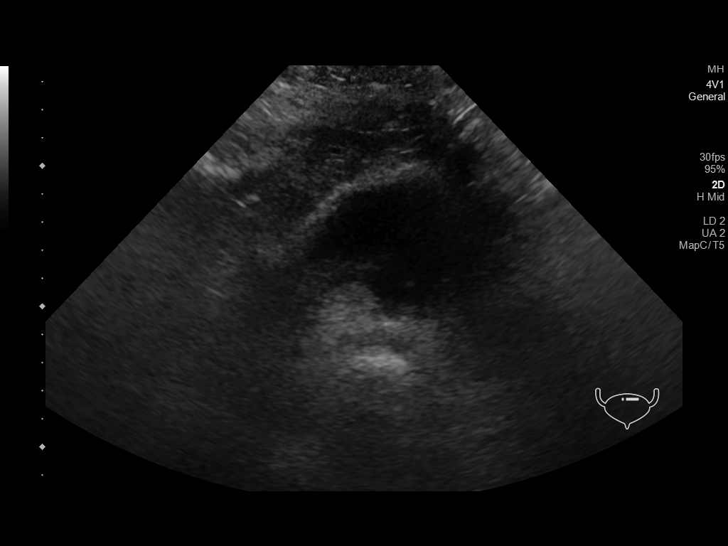
[im 42/42]
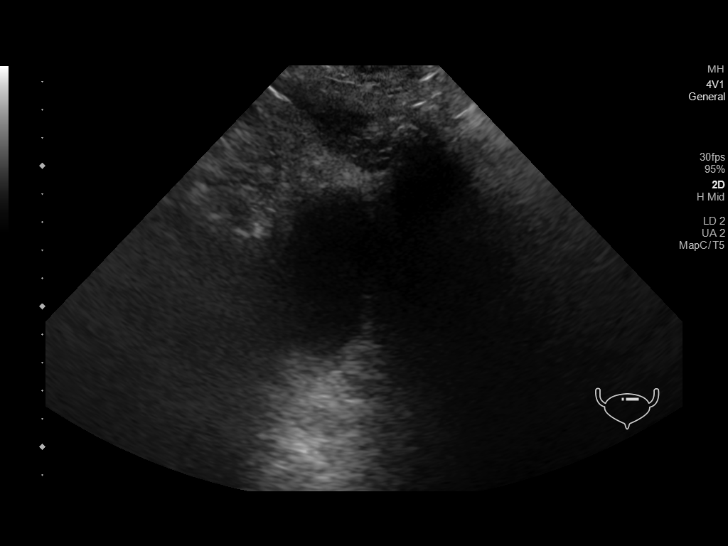

[14 of 25 positions shown; findings below may reference images not displayed]

FINDINGS: Right Kidney:

Renal measurements: 10.6 x 3.7 x 5.0 cm = volume: 104.7 mL. Slight
increased renal echogenicity suggesting medical renal disease. Mild
renal cortical thinning. No focal lesions or hydronephrosis.

Left Kidney:

Renal measurements: 10.6 x 5.4 x 5.0 cm = volume: 151.1 mL. Slight
increased renal echogenicity suggesting medical renal disease. Mild
renal cortical thinning. No worrisome renal lesions. No
hydronephrosis.

Bladder:

Appears normal for degree of bladder distention.

Other: Incidental gallstone noted the gallbladder measuring 1.7 cm.
IMPRESSION: 1. Slight increased renal echogenicity and slight renal cortical
thinning consistent with medical renal disease.
2. No worrisome renal lesions or hydronephrosis.
3. Incidental cholelithiasis.

## 2020-10-23 IMAGING — DX PORTABLE CHEST - 1 VIEW
1 series · 1 of 1 positions shown · non-contrast
Comparison: 11/21/2018 chest radiograph.

CLINICAL DATA: Central line placement

EXAM:
PORTABLE CHEST 1 VIEW

[chest ap]
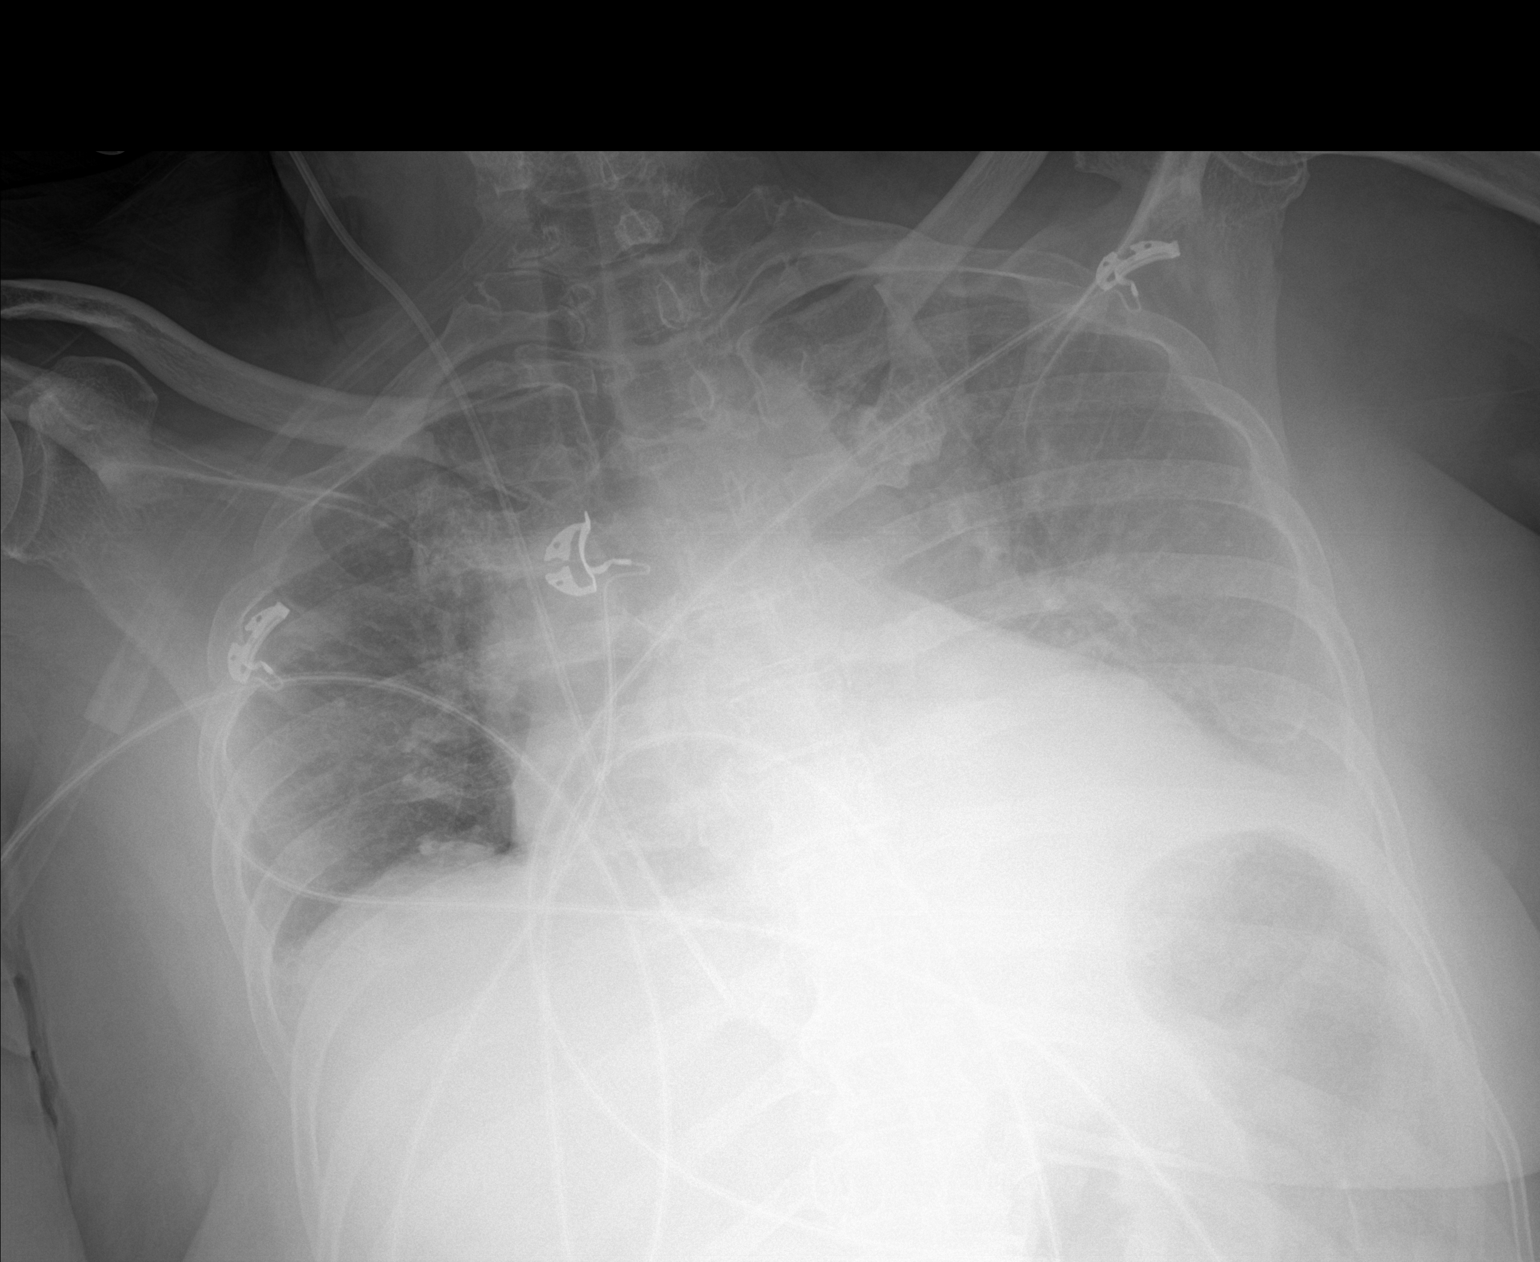

[1 of 1 positions shown; findings below may reference images not displayed]

FINDINGS: Low lung volumes. Right internal jugular central venous catheter
terminates over the inferior cavoatrial junction. Stable
cardiomediastinal silhouette with top-normal heart size. No
pneumothorax. No pleural effusion. Vascular crowding without overt
pulmonary edema. Left retrocardiac opacity.
IMPRESSION: 1. No pneumothorax. Right internal jugular central venous catheter
terminates over the inferior cavoatrial junction.
2. Low lung volumes with left retrocardiac opacity, either
atelectasis or pneumonia. Chest radiograph follow-up advised.

## 2020-10-24 IMAGING — CT CT CHEST WITHOUT CONTRAST
2 of 3 series · 15 of 36 positions shown, 18 images · non-contrast
Comparison: None.

CLINICAL DATA: LEFT hilar fullness.  Short of breath

EXAM:
CT CHEST WITHOUT CONTRAST
TECHNIQUE: Multidetector CT imaging of the chest was performed following the
standard protocol without IV contrast.

[Series 2: thorax · axial · 0.63mm/px · z∈[-256,-30]mm · 12 of 133 slices shown, 15 images]
[im 10/133  mediastinal]
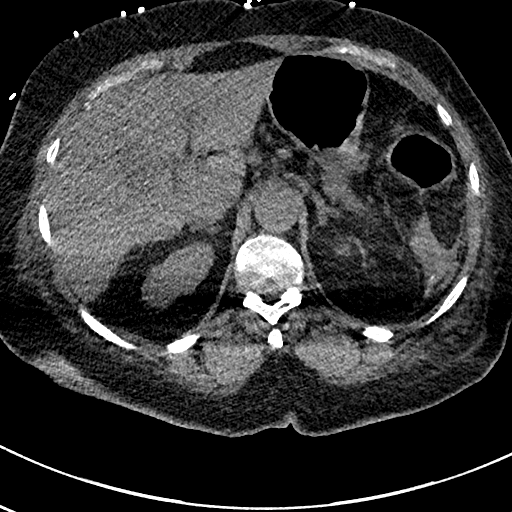
[im 10/133  lung]
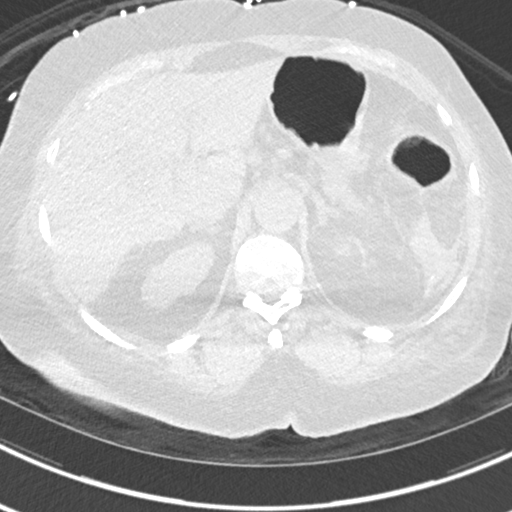
[im 20/133  lung]
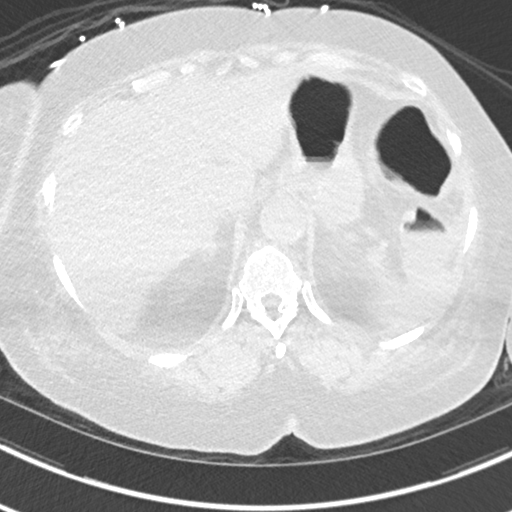
[im 30/133  lung]
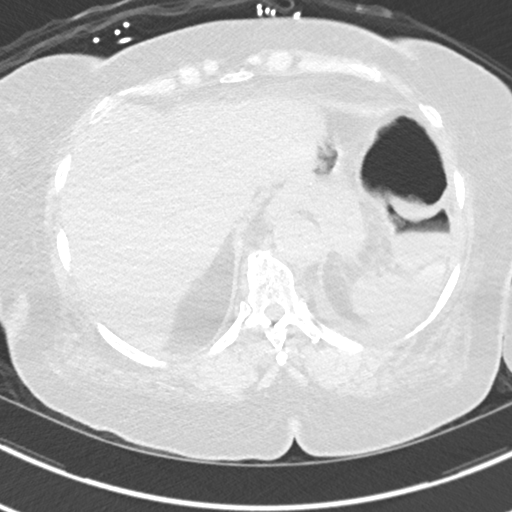
[im 40/133  lung]
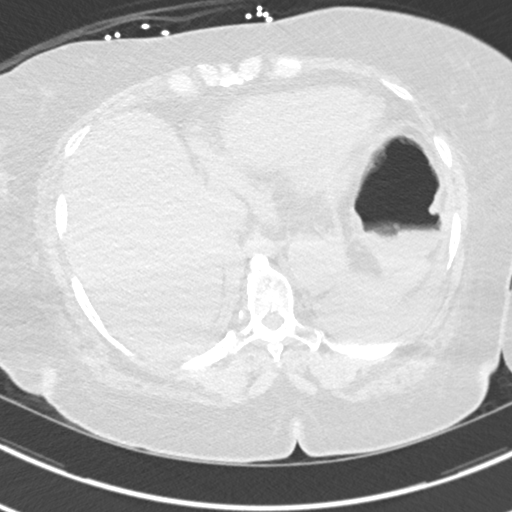
[im 49/133  mediastinal]
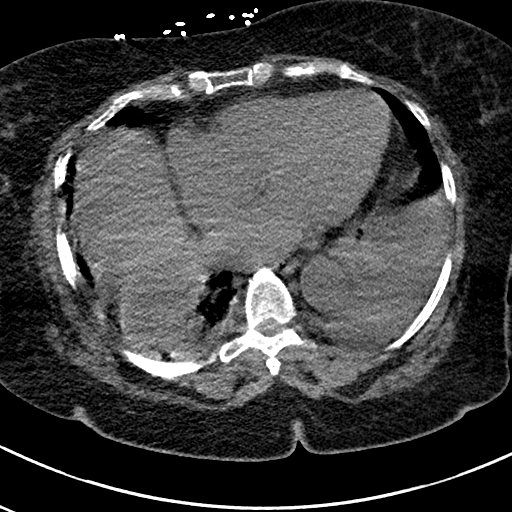
[im 49/133  lung]
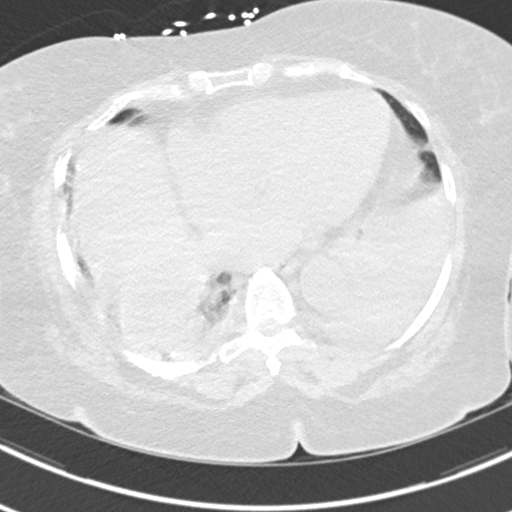
[im 59/133  lung]
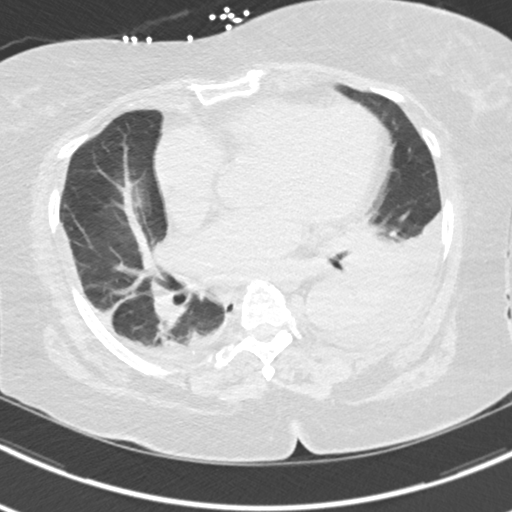
[im 74/133  lung]
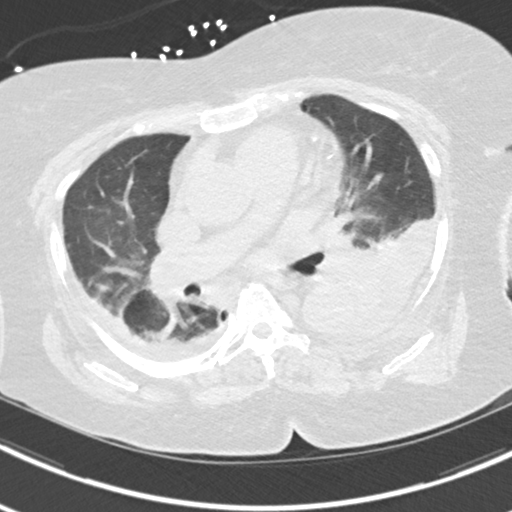
[im 84/133  lung]
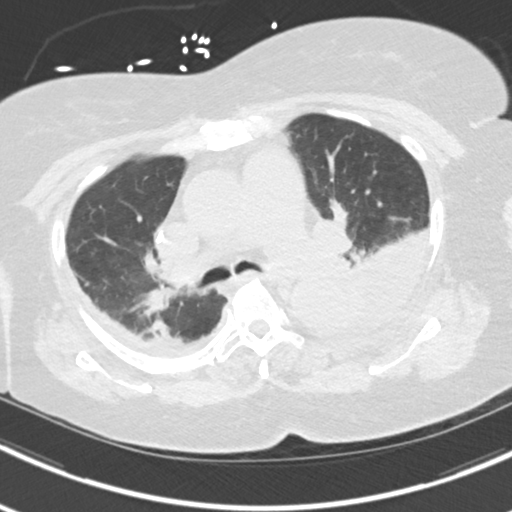
[im 93/133  mediastinal]
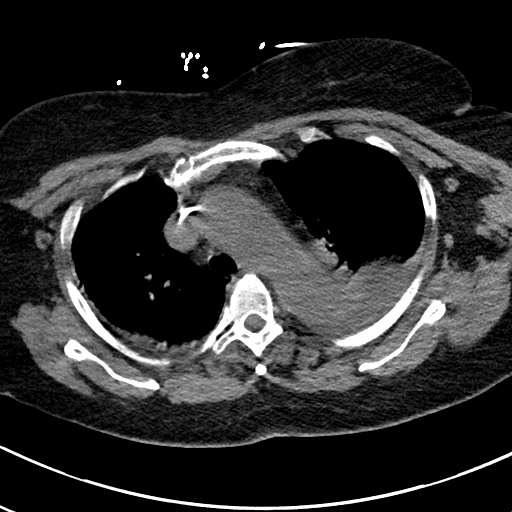
[im 93/133  lung]
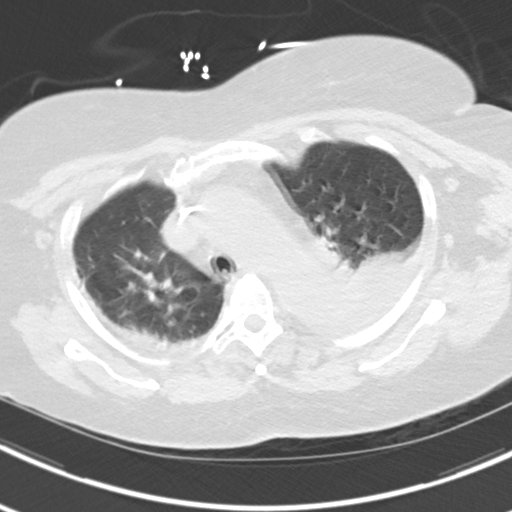
[im 103/133  lung]
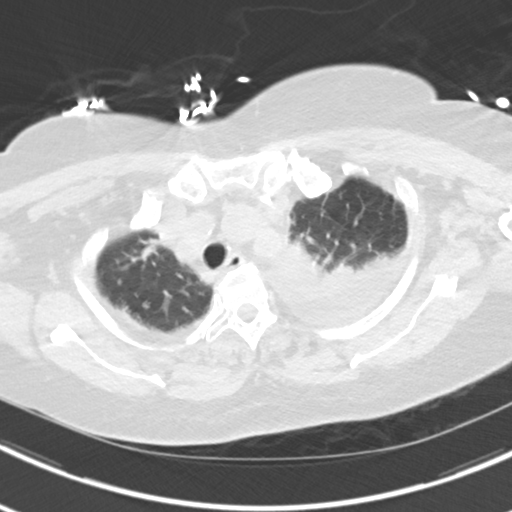
[im 113/133  lung]
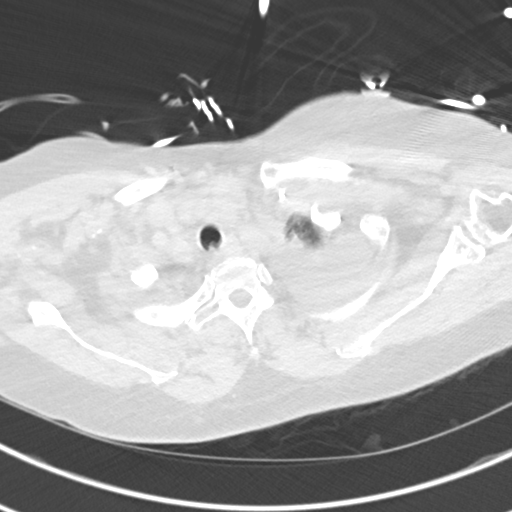
[im 123/133  lung]
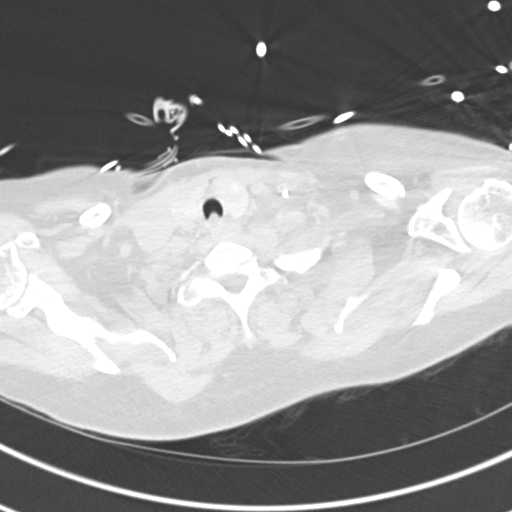

[Series 5: coronal · coronal · 0.59mm/px · 3 of 132 slices shown]
[im 27/132  lung]
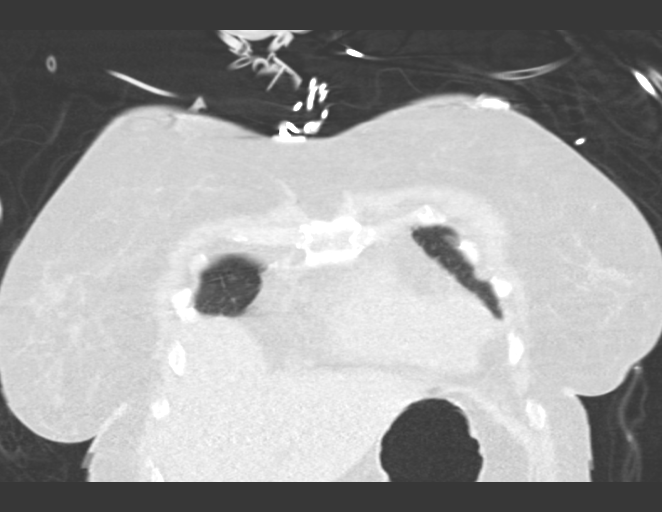
[im 53/132  lung]
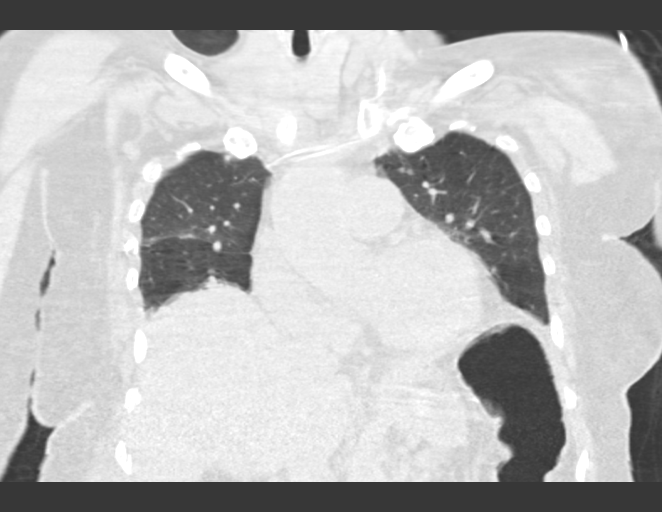
[im 79/132  lung]
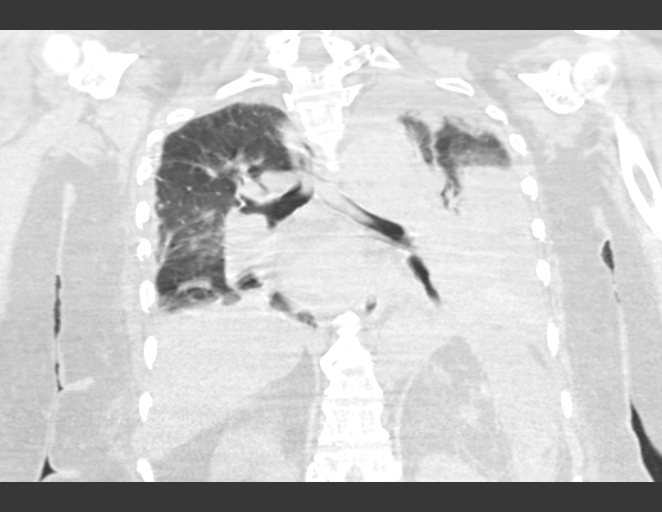

[15 of 36 positions shown; findings below may reference images not displayed]

FINDINGS: Cardiovascular: Central venous line tip in the distal SVC. No
pericardial effusion.

Mediastinum/Nodes: No axillary or supraclavicular adenopathy. No
mediastinal adenopathy. Fullness in LEFT hilum is difficult to
assess without IV contrast.

Lungs/Pleura: Moderate LEFT effusion with passive atelectasis of the
LEFT lower lobe. No clear obstructing lesion identified. Smaller
effusion on the RIGHT.

Upper Abdomen: Limited view of the liver, kidneys, pancreas are
unremarkable. Normal adrenal glands.

Musculoskeletal: No aggressive osseous lesion.
IMPRESSION: 1. Moderate to large LEFT effusion with passive atelectasis of the
LEFT lower lobe. No clear mass identified. Fullness in the LEFT
hilum is difficult to assess without IV contrast.
2. Small RIGHT effusion.
3. Central venous line in place without complication.

## 2020-10-24 IMAGING — DX PORTABLE CHEST - 1 VIEW
1 series · 1 of 1 positions shown · non-contrast
Comparison: 03/25/2019

CLINICAL DATA: Left central line placement

EXAM:
PORTABLE CHEST 1 VIEW

[chest ap]
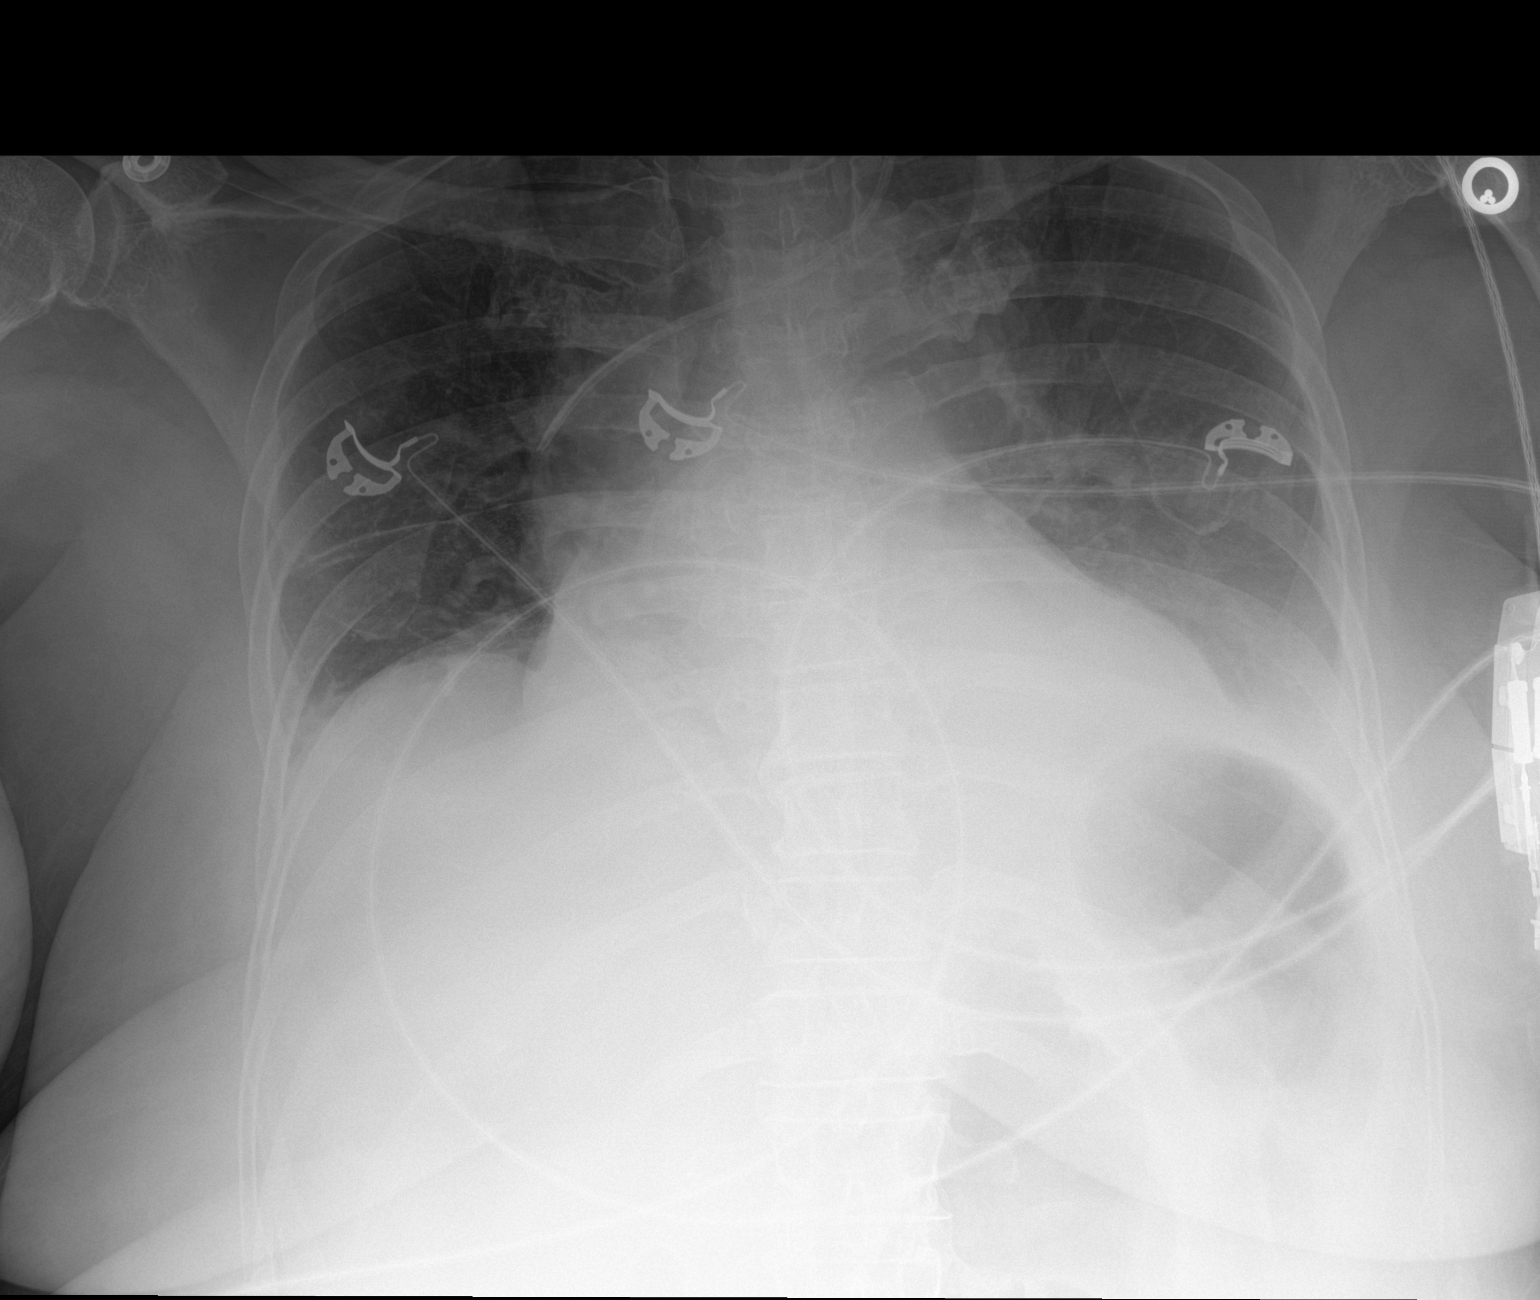

[1 of 1 positions shown; findings below may reference images not displayed]

FINDINGS: Left central line has been placed with the tip in the SVC. No
pneumothorax. Layering left pleural effusion with left lower lobe
atelectasis or infiltrate. Low lung volumes. Minimal right base
atelectasis. Cardiomegaly, vascular congestion.
IMPRESSION: Left central line placement with the tip in the SVC. No
pneumothorax.

Layering left effusion. Left base atelectasis or infiltrate. Minimal
right base atelectasis.

Cardiomegaly, vascular congestion.

## 2020-10-26 IMAGING — DX PORTABLE CHEST - 1 VIEW
1 series · 1 of 1 positions shown · non-contrast
Comparison: 03/26/2019

CLINICAL DATA: Follow-up pleural effusion

EXAM:
PORTABLE CHEST 1 VIEW

[chest ap]
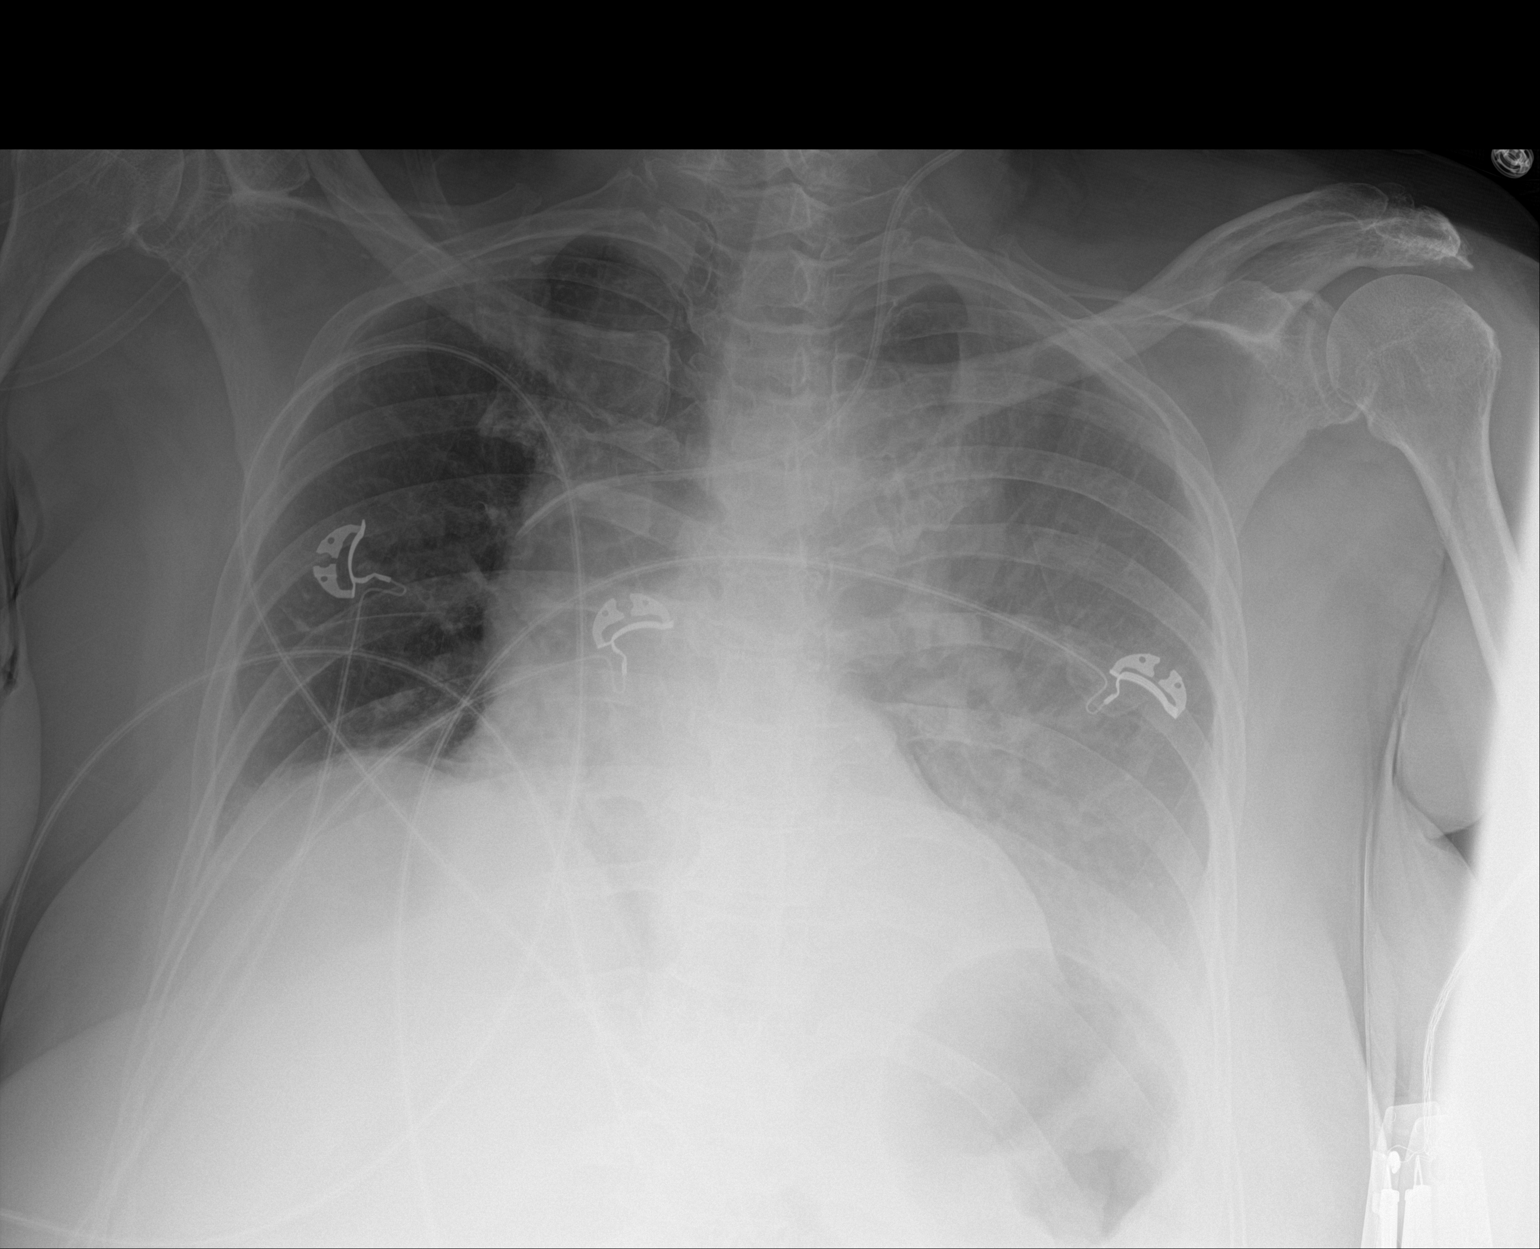

[1 of 1 positions shown; findings below may reference images not displayed]

FINDINGS: Cardiac shadow is stable. Left jugular central line is again seen
and stable. Increasing density over the left hemithorax is noted
consistent with a posteriorly layering effusion. Left basilar
atelectasis is noted as well stable from the prior exam. No new
focal abnormality is noted.
IMPRESSION: Stable changes on the left similar to that seen on prior CT.

## 2020-10-27 IMAGING — DX PORTABLE CHEST - 1 VIEW
1 series · 1 of 1 positions shown · non-contrast
Comparison: 03/28/2019

CLINICAL DATA: Follow-up pleural effusion

EXAM:
PORTABLE CHEST 1 VIEW

[chest ap]
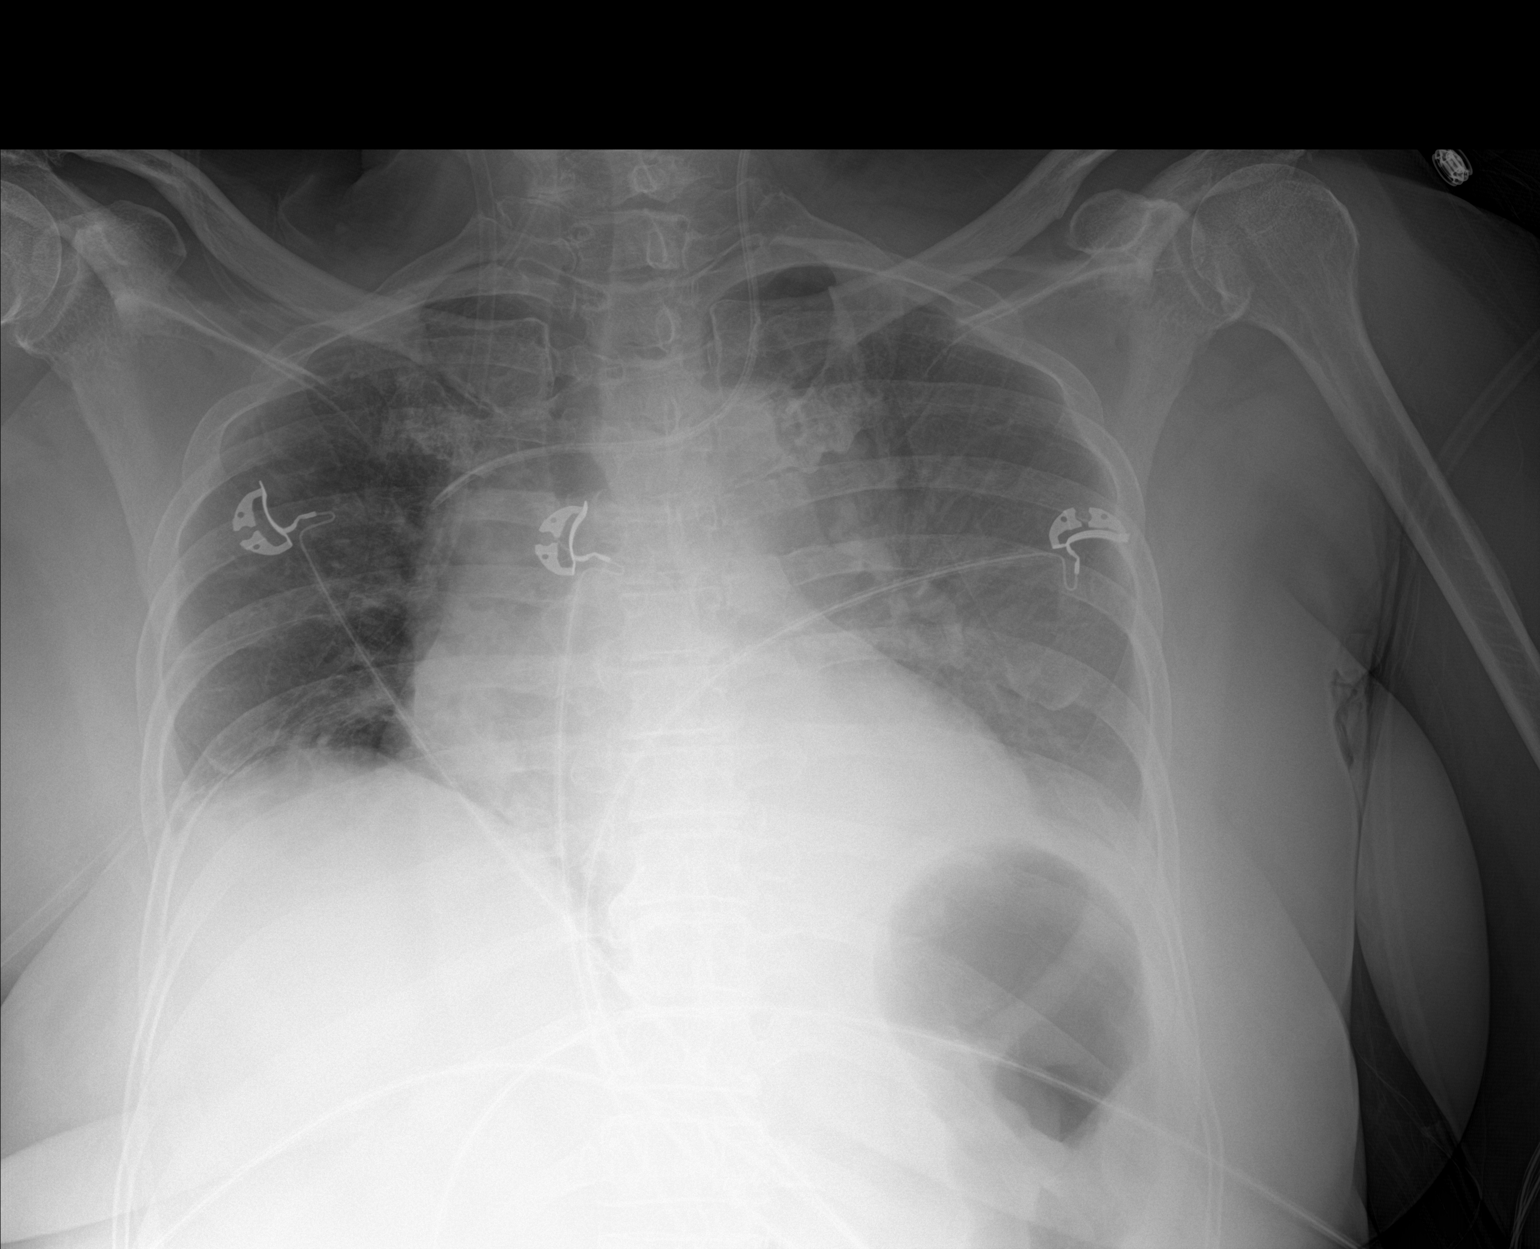

[1 of 1 positions shown; findings below may reference images not displayed]

FINDINGS: Cardiac shadow remains enlarged. Left jugular central line is again
seen and stable. Mild increased density is noted over the left chest
consistent with small pleural effusion. The overall inspiratory
effort is poor. No bony abnormality is noted.
IMPRESSION: Persistent left posterior effusion.

## 2020-10-27 IMAGING — CT CT HEAD WITHOUT CONTRAST
3 series · 15 of 45 positions shown, 18 images · non-contrast
Comparison: Head CT 01/25/2007

CLINICAL DATA: Hypotensive.

EXAM:
CT HEAD WITHOUT CONTRAST
TECHNIQUE: Contiguous axial images were obtained from the base of the skull
through the vertex without intravenous contrast.

[Series 2: head wo · axial · 0.47mm/px · z∈[+1543,+1658]mm · 9 of 28 slices shown, 12 images]
[im 3/28  brain]
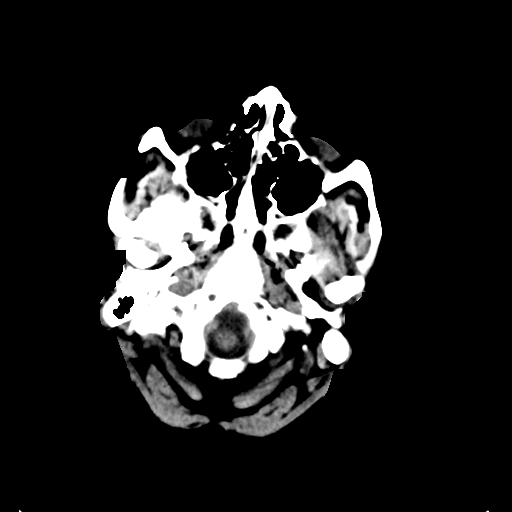
[im 3/28  bone]
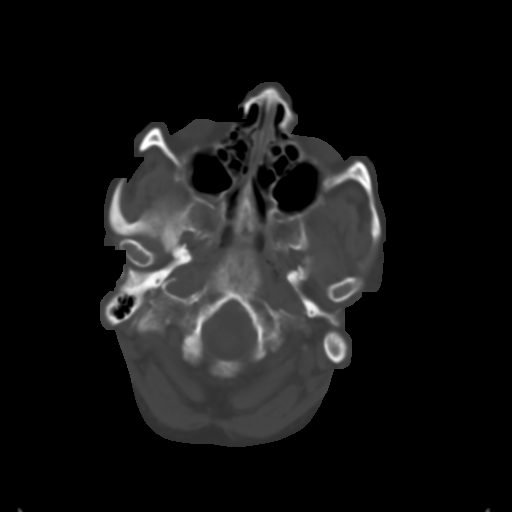
[im 6/28  brain]
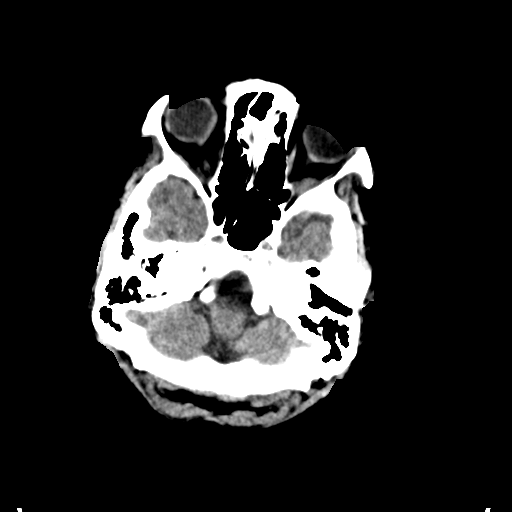
[im 9/28  brain]
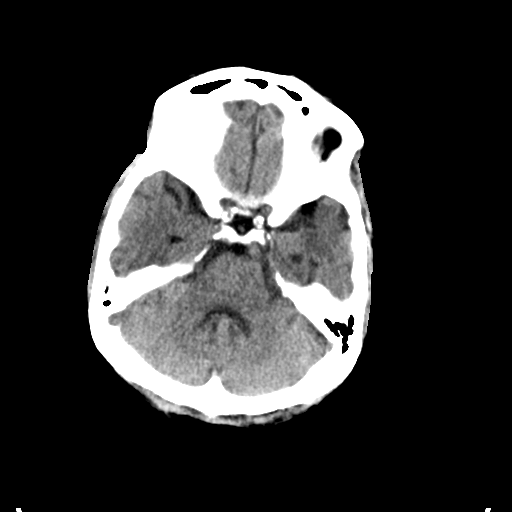
[im 12/28  brain]
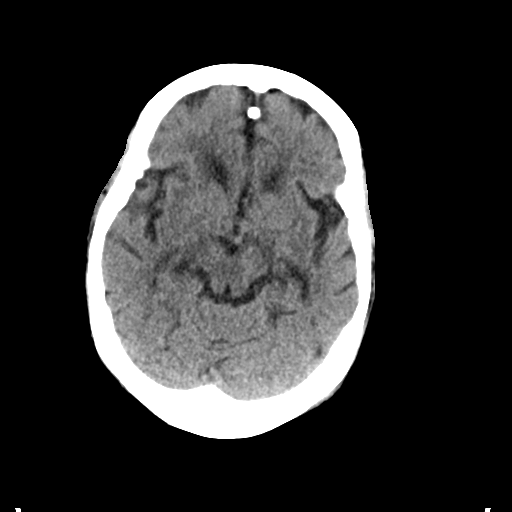
[im 15/28  brain]
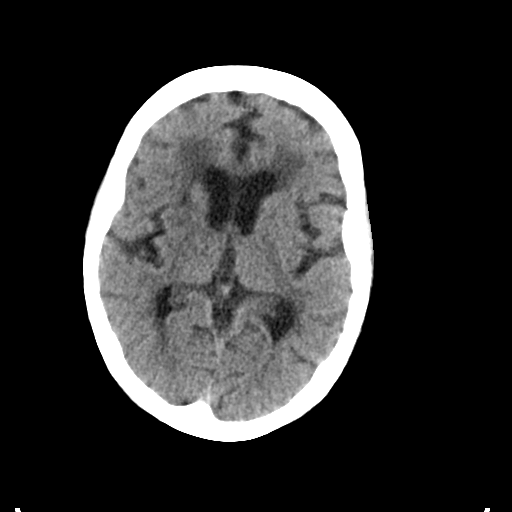
[im 15/28  bone]
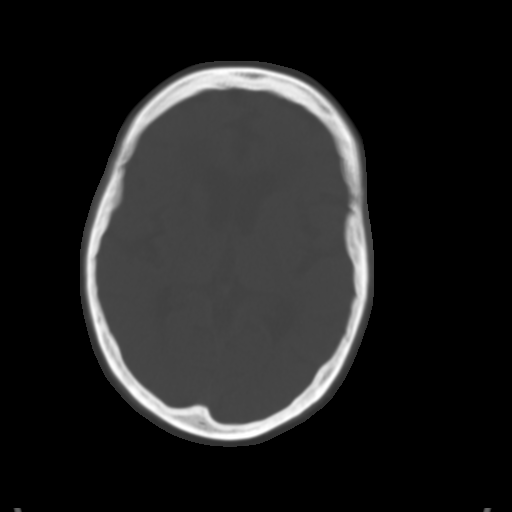
[im 17/28  brain]
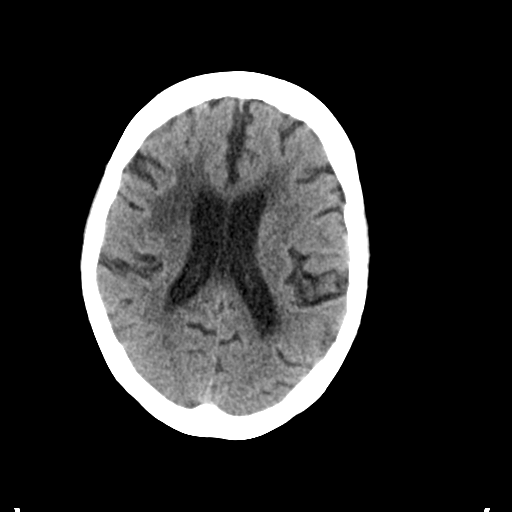
[im 20/28  brain]
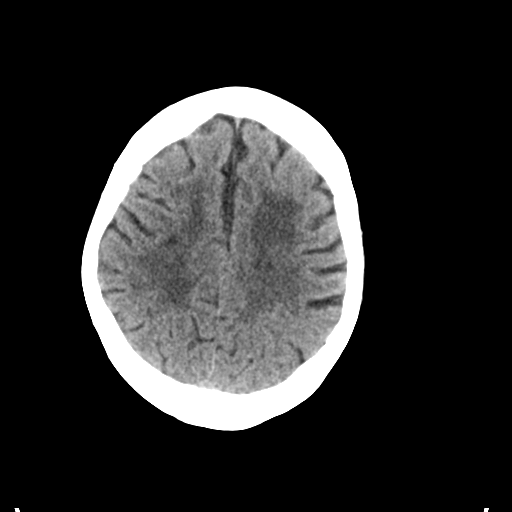
[im 23/28  brain]
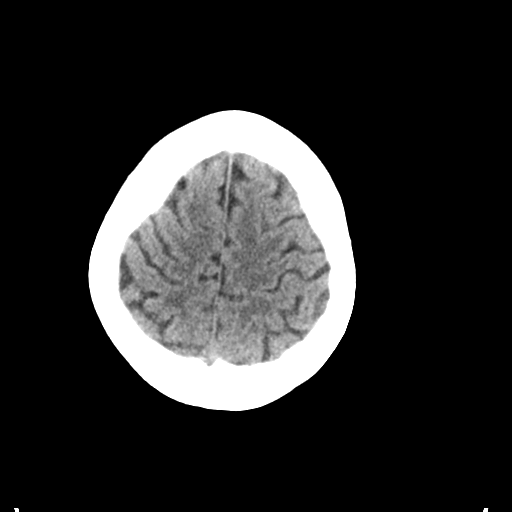
[im 26/28  brain]
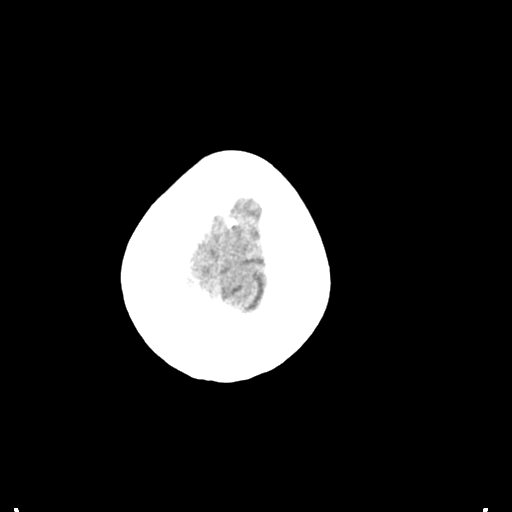
[im 26/28  bone]
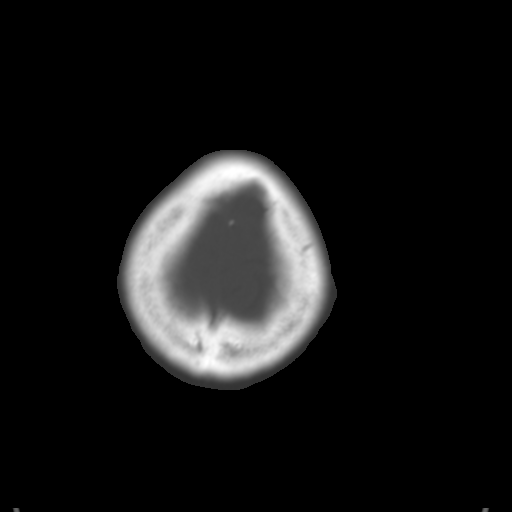

[Series 4: coronal soft tissue · coronal · 0.33mm/px · 3 of 61 slices shown]
[im 21/61  brain]
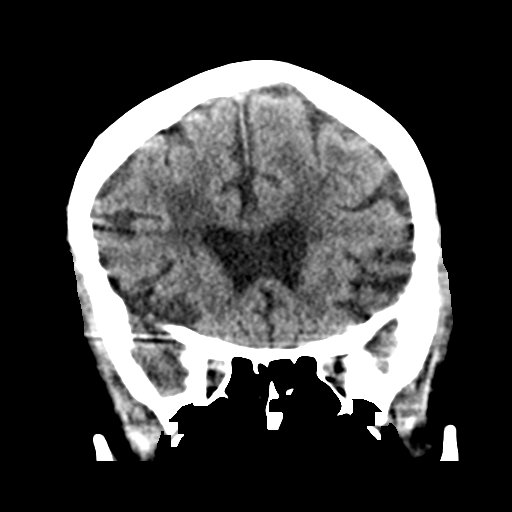
[im 27/61  brain]
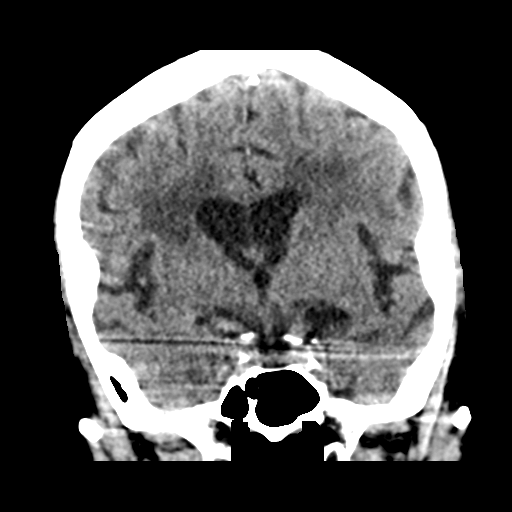
[im 34/61  brain]
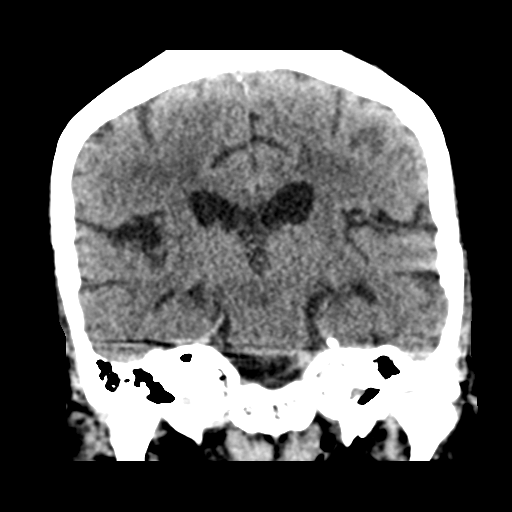

[Series 5: sagittal soft tissue · sagittal · 0.28mm/px · 3 of 51 slices shown]
[im 17/51  brain]
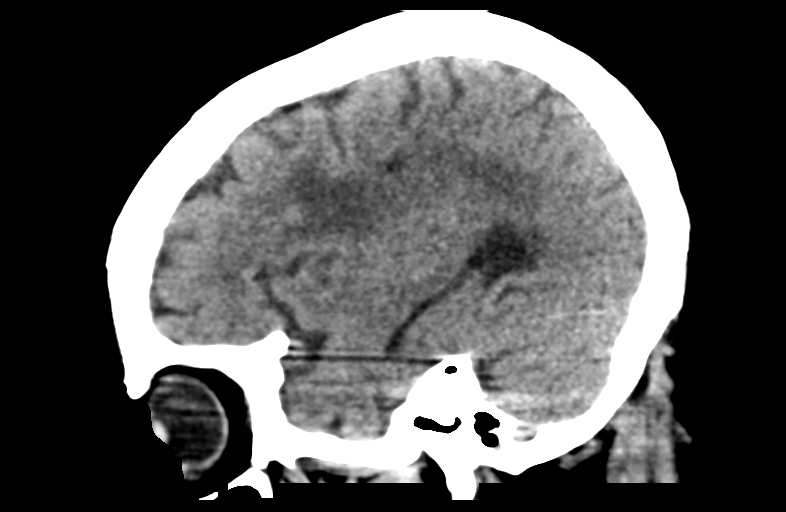
[im 26/51  brain]
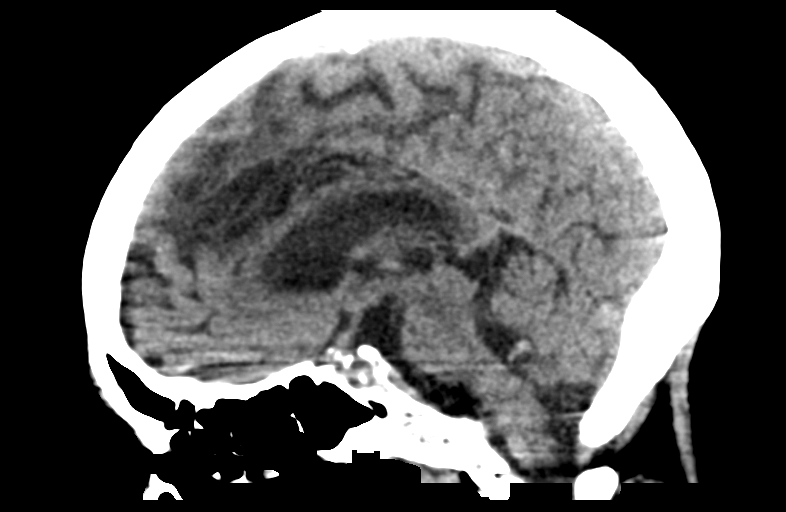
[im 34/51  brain]
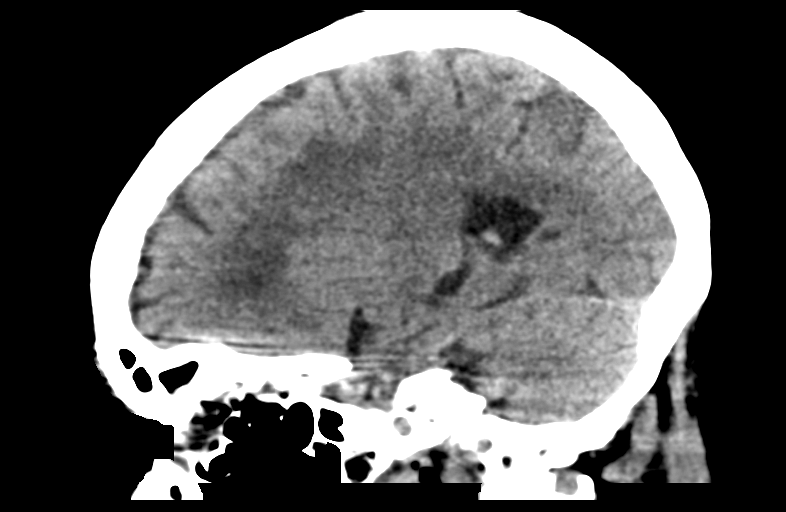

[15 of 45 positions shown; findings below may reference images not displayed]

FINDINGS: Brain: Age advanced cerebral atrophy, ventriculomegaly and
significant periventricular white matter disease, likely
microvascular ischemic change. Findings progressive since the prior
CT scan.

Ventricles are in the midline and demonstrate normal configuration.
No extra-axial fluid collections are identified.

No CT findings for acute intracranial process such as hemispheric
infarction or intracranial hemorrhage. No mass lesions are
identified. The brainstem and cerebellum are grossly normal.

Vascular: Moderate vascular calcifications but no definite aneurysm
or hyperdense vessels.

Skull: No skull fracture or worrisome bone lesions.

Sinuses/Orbits: The paranasal sinuses and mastoid air cells are
grossly clear. The globes are intact.

Other: No scalp lesions or hematoma.
IMPRESSION: 1. Age advanced cerebral atrophy, ventriculomegaly and significant
periventricular white matter disease.
2. No acute intracranial findings or mass lesion.

## 2020-10-28 IMAGING — MR MRI HEAD WITHOUT CONTRAST
8 of 10 series · 36 of 48 positions shown · non-contrast
Comparison: Head CT 03/29/2019.

CLINICAL DATA: 68-year-old female with persistent encephalopathy.

EXAM:
MRI HEAD WITHOUT CONTRAST
TECHNIQUE: Multiplanar, multiecho pulse sequences of the brain and surrounding
structures were obtained without intravenous contrast.

[Series 3: DWI · axial · 3.0mm · 1.09mm/px · z∈[-57,+86]mm · 9 of 98 slices shown (1 of 4)]
[im 1/98]
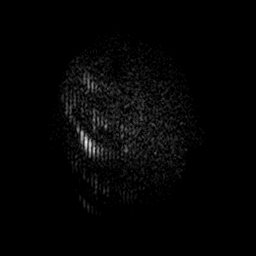
[im 13/98]
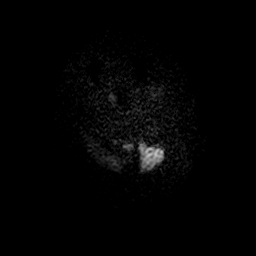
[im 25/98]
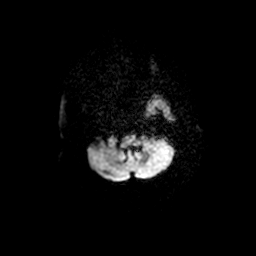
[im 37/98]
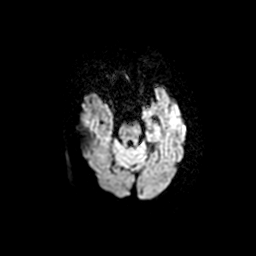
[im 49/98]
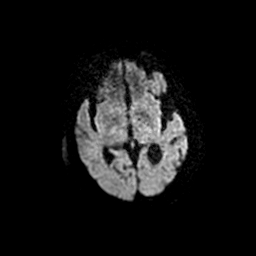
[im 61/98]
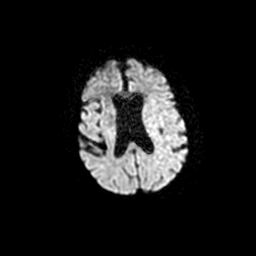
[im 73/98]
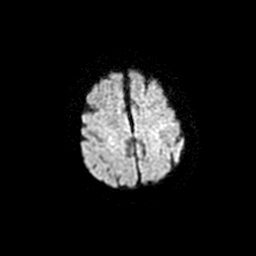
[im 85/98]
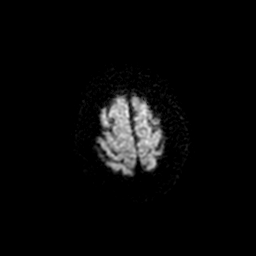
[im 98/98]
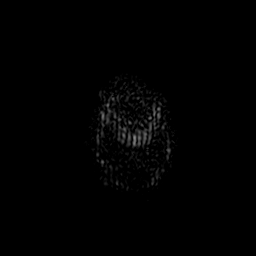

[Series 4: T1 · sagittal · 5.0mm · 0.47mm/px · 1 of 22 slices shown]
[im 1/22]
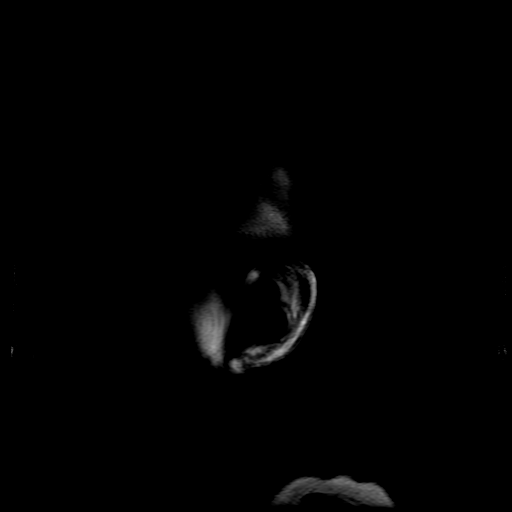

[Series 5: T2 · axial · 5.0mm · 0.43mm/px · z∈[-58,+90]mm · 3 of 26 slices shown (1 of 2)]
[im 1/26]
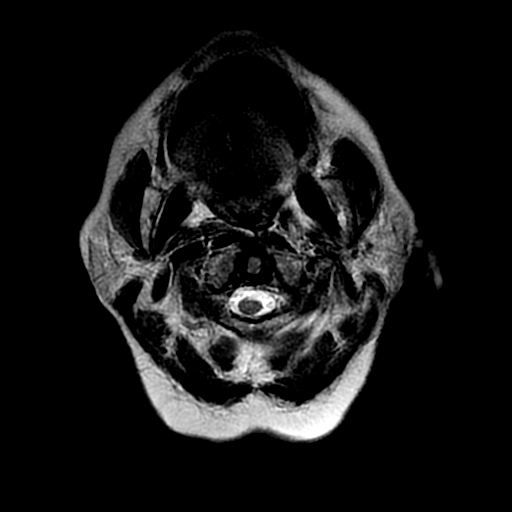
[im 13/26]
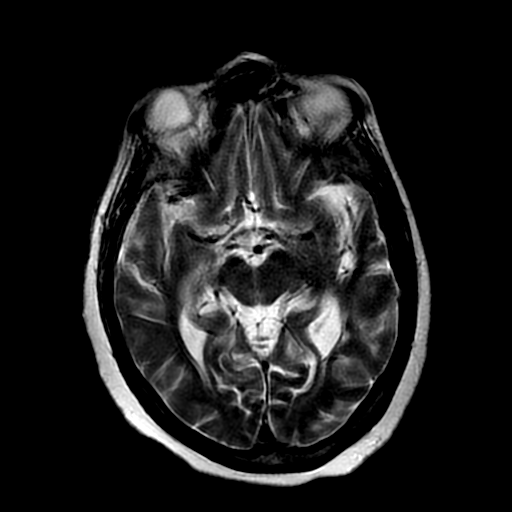
[im 26/26]
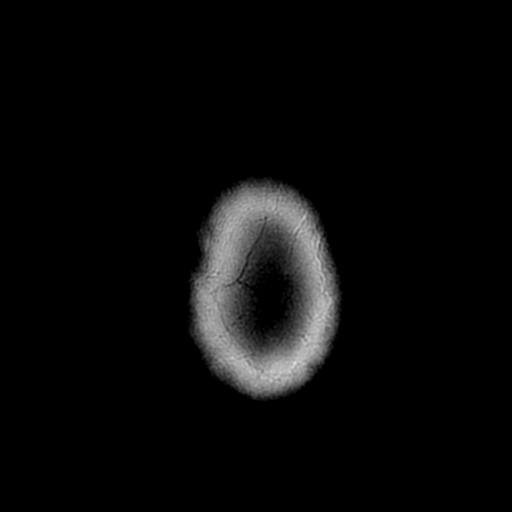

[Series 6: FLAIR · axial · 3.0mm · 0.43mm/px · z∈[-58,+90]mm · 3 of 26 slices shown]
[im 1/26]
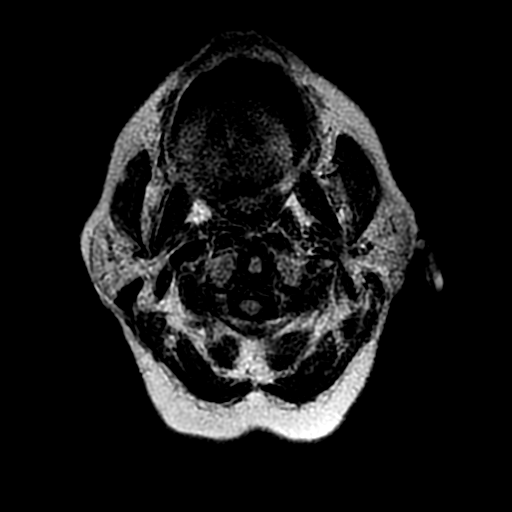
[im 13/26]
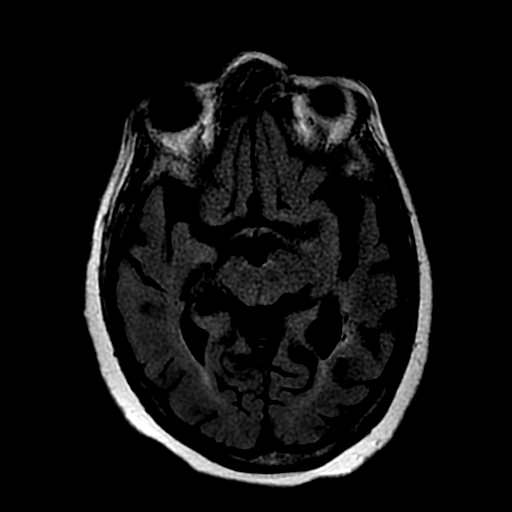
[im 26/26]
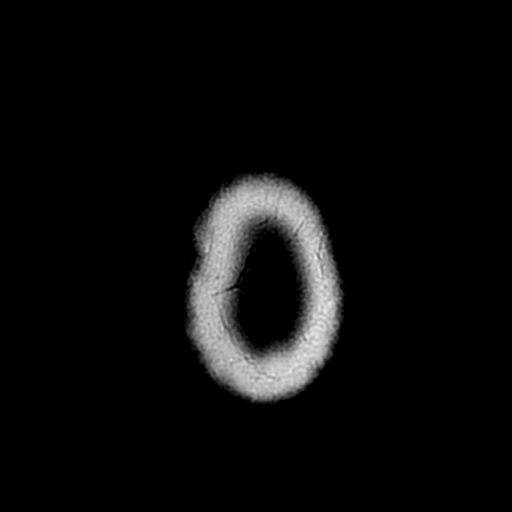

[Series 9: DWI · coronal · 4.0mm · 1.09mm/px · 8 of 84 slices shown (2 of 4)]
[im 1/84]
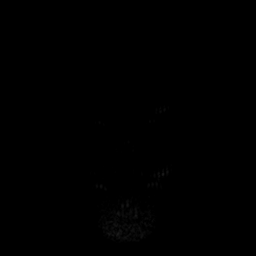
[im 12/84]
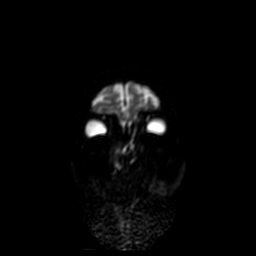
[im 24/84]
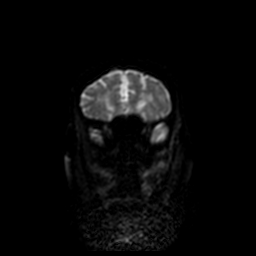
[im 36/84]
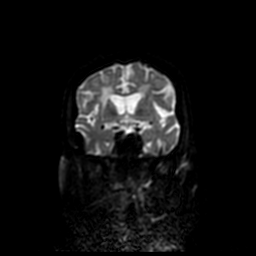
[im 48/84]
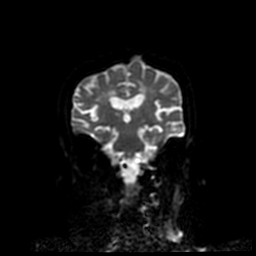
[im 60/84]
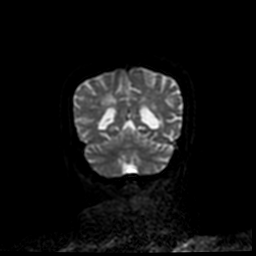
[im 72/84]
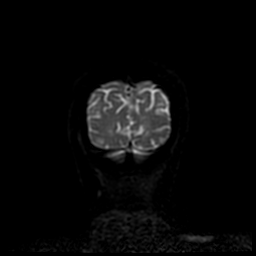
[im 84/84]
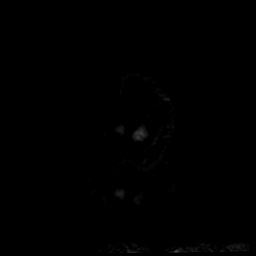

[Series 10: T2 · coronal · 5.0mm · 0.45mm/px · 3 of 27 slices shown (2 of 2)]
[im 1/27]
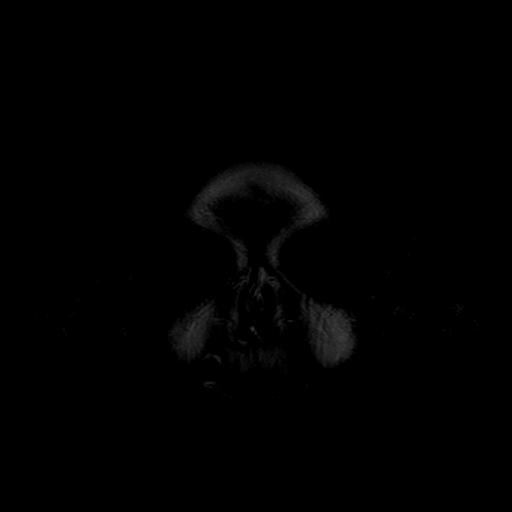
[im 14/27]
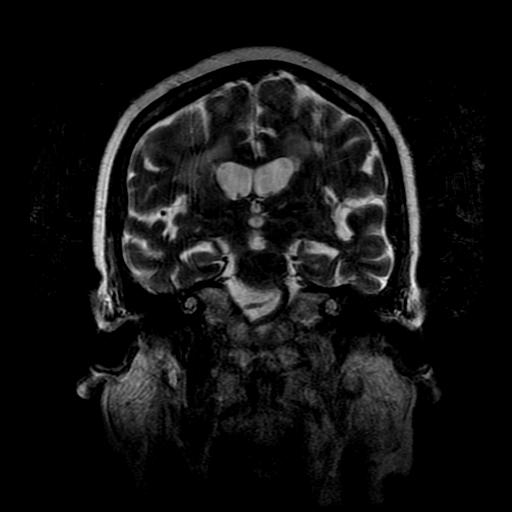
[im 27/27]
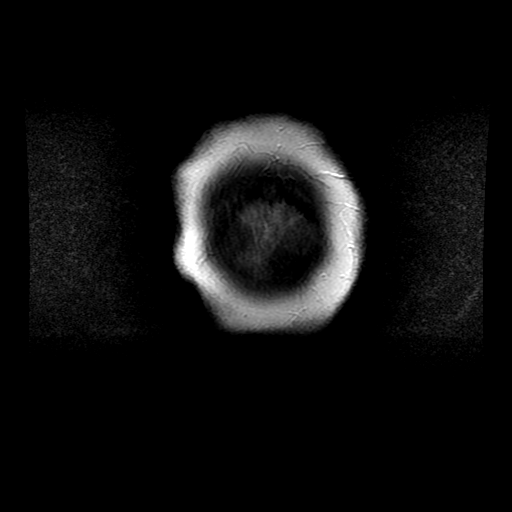

[Series 300: DWI · axial · 3.0mm · 1.09mm/px · z∈[-57,+86]mm · 5 of 48 slices shown (3 of 4)]
[im 1/48]
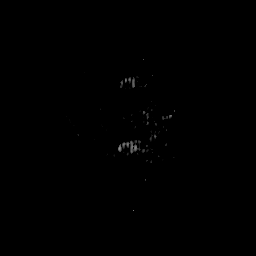
[im 12/48]
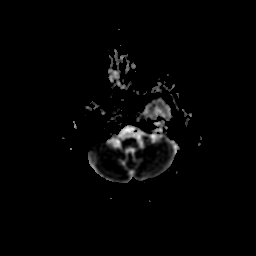
[im 24/48]
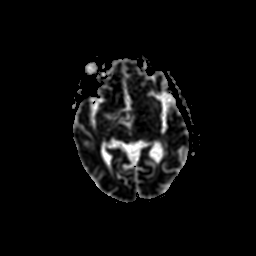
[im 36/48]
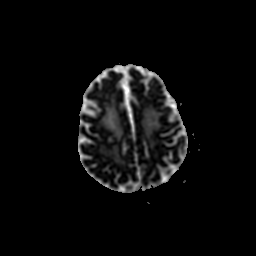
[im 48/48]
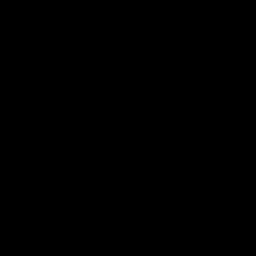

[Series 900: DWI · coronal · 4.0mm · 1.09mm/px · 4 of 42 slices shown (4 of 4)]
[im 1/42]
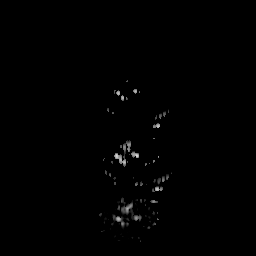
[im 14/42]
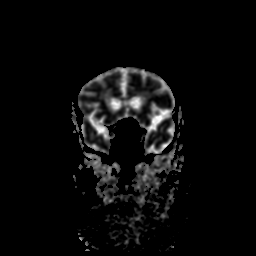
[im 28/42]
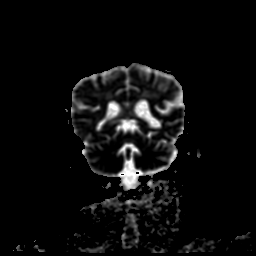
[im 42/42]
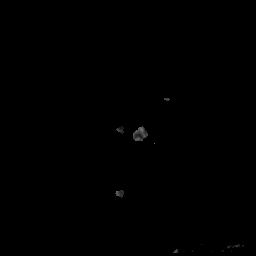

[36 of 48 positions shown; findings below may reference images not displayed]

FINDINGS: Brain: Intermittent motion artifact. There is a small right
posterior centrum semi of bowel focus of heterogeneous diffusion
(series 3, image 36) which does appear restricted on series 300,
image 36. There is a nearby chronic lacunar infarct (series 8, image
57) and generalized underlying bilateral cerebral white matter T2
and FLAIR hyperintensity.

There is a 2nd punctate area of restricted diffusion in the right
periatrial white matter best seen on series 9 image 10 and series
900, image 10.

No associated hemorrhage or mass effect.

No other restricted diffusion. Chronic lacunar infarct of the right
corona radiata and basal ganglia. Chronic lacunar infarcts in both
thalami with microhemorrhage. There is a small area of chronic
microhemorrhage in the right cerebellum on series 7, image 9.
Probable chronic microhemorrhage in the right pons on image 13.

No superimposed cortical encephalomalacia identified. No midline
shift, mass effect, evidence of mass lesion, ventriculomegaly,
extra-axial collection or acute intracranial hemorrhage.
Cervicomedullary junction and pituitary are within normal limits.

Vascular: Major intracranial vascular flow voids are preserved.
Generalized intracranial artery tortuosity.

Skull and upper cervical spine: Negative visible cervical spine.
Visualized bone marrow signal is within normal limits.

Sinuses/Orbits: Grossly negative orbits. Paranasal Visualized
paranasal sinuses and mastoids are stable and well pneumatized.

Other: Scalp and face soft tissues appear negative.
IMPRESSION: 1. Positive for two small acute on chronic cerebral white matter
lacunar infarcts, both in the right hemisphere. No associated
hemorrhage or mass effect.
2. Underlying severe chronic small vessel disease, including several
chronic micro-hemorrhages.

## 2020-10-28 IMAGING — DX PORTABLE CHEST - 1 VIEW
1 series · 1 of 1 positions shown · non-contrast
Comparison: 03/29/2019

CLINICAL DATA: Follow-up pleural effusion

EXAM:
PORTABLE CHEST 1 VIEW

[chest ap]
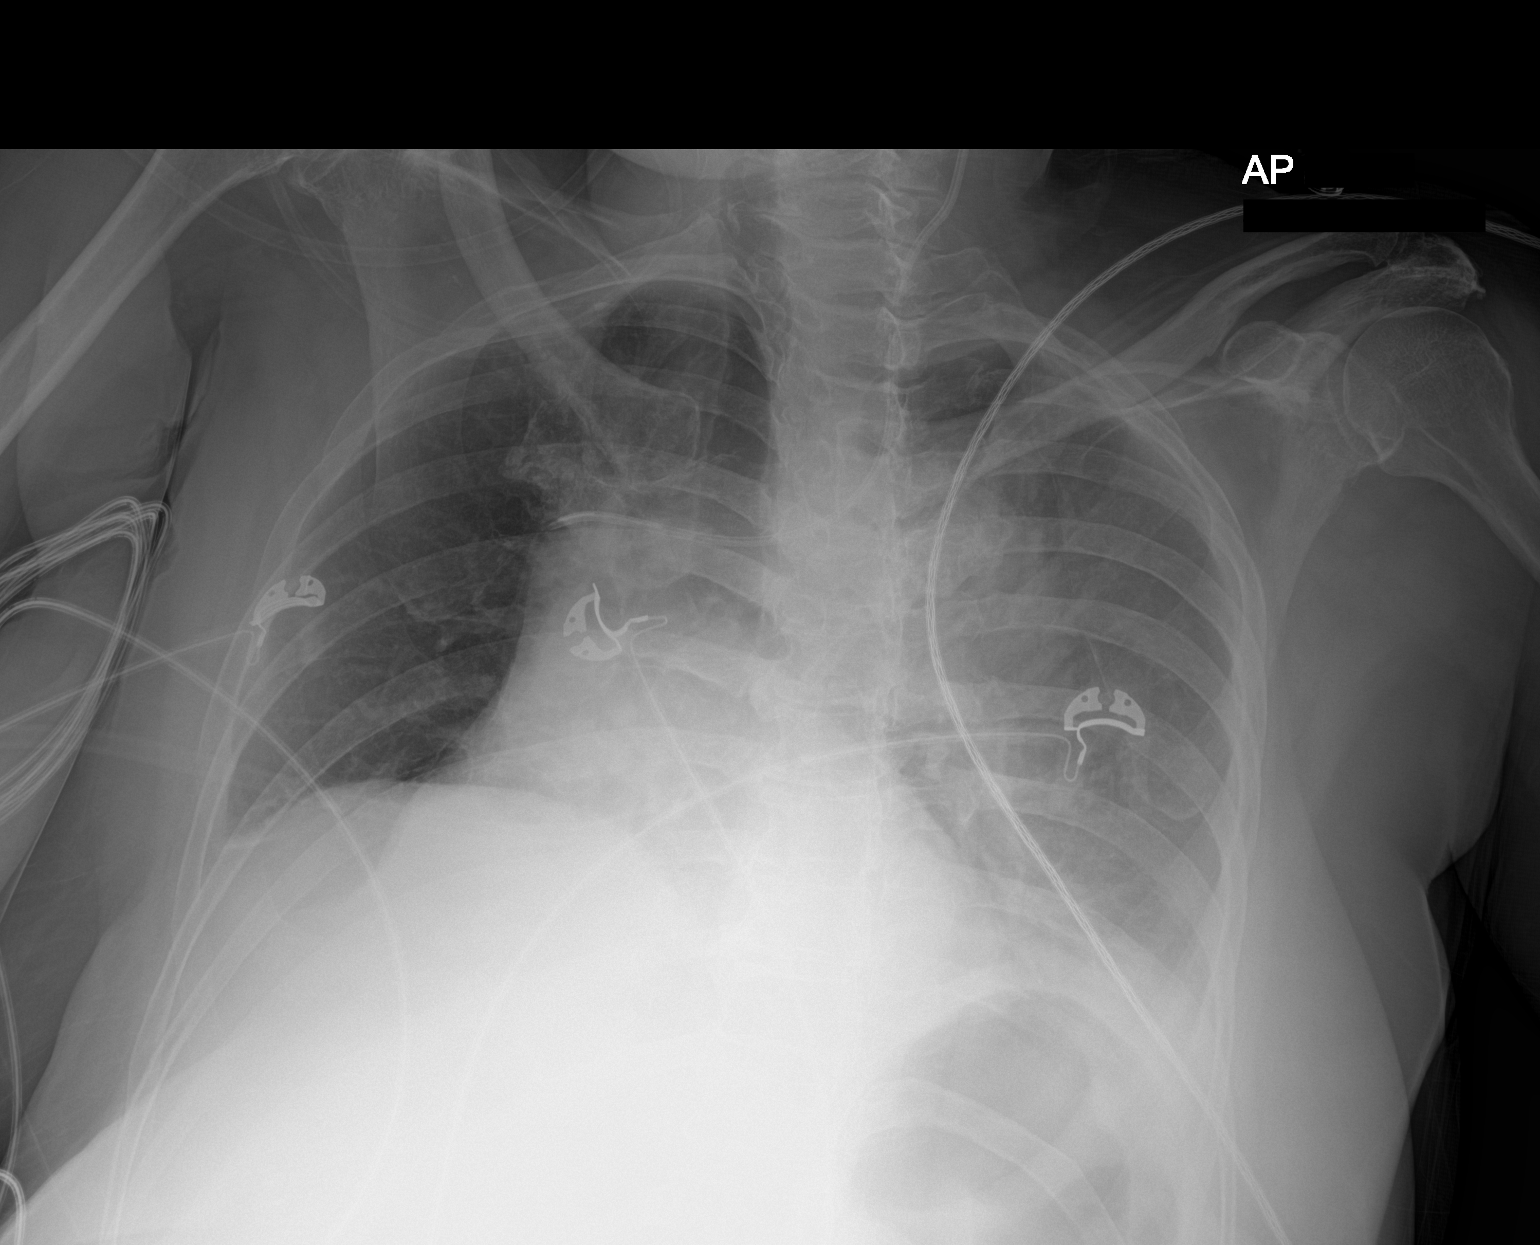

[1 of 1 positions shown; findings below may reference images not displayed]

FINDINGS: Cardiac shadow is within normal limits. Left jugular central line is
again seen and stable. The overall inspiratory effort is poor. Mild
bibasilar atelectasis is noted. There are changes suggestive of
small left pleural effusion posteriorly.
IMPRESSION: Poor inspiratory effort with mild bibasilar atelectasis and small
left effusion.

## 2020-10-29 IMAGING — MR MRI AND MRA NECK WITHOUT CONTRAST
2 of 3 series · 18 of 48 positions shown · non-contrast
Comparison: 03/30/2019

CLINICAL DATA: Admission with confusion and encephalopathy. 2 right
hemispheric white matter infarctions identified yesterday.

EXAM:
MRA NECK WITHOUT  CONTRAST
MRA HEAD WITHOUT CONTRAST
TECHNIQUE: Multiplanar and multiecho pulse sequences of the neck were obtained
without and with intravenous contrast. Angiographic images of the
neck were obtained using MRA technique without and with intravenous
contast.; Angiographic images of the Circle of Willis were obtained
using MRA technique without intravenous contrast.

[Series 3: ax (id) · axial · 1.0mm · 0.43mm/px · z∈[-91,-3]mm · 11 of 184 slices shown]
[im 1/184]
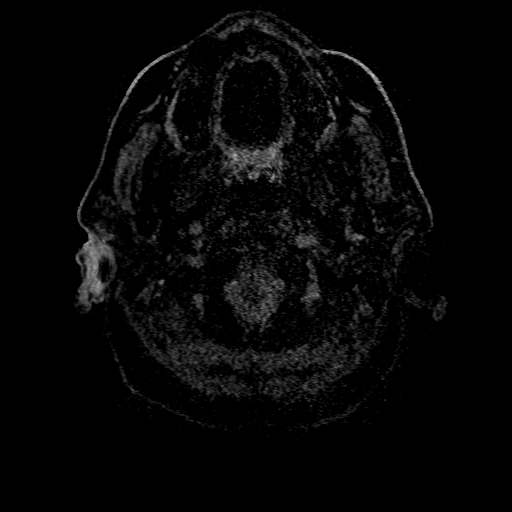
[im 19/184]
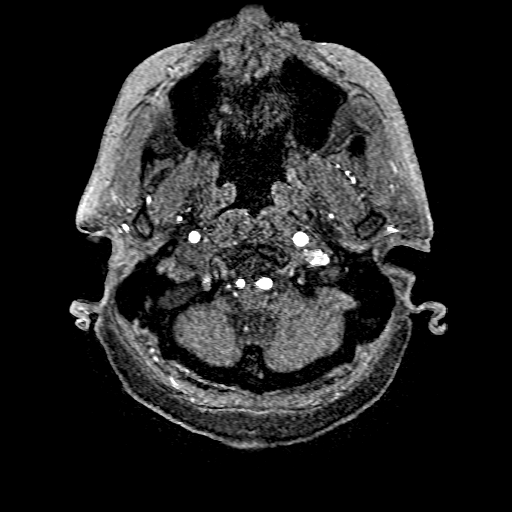
[im 37/184]
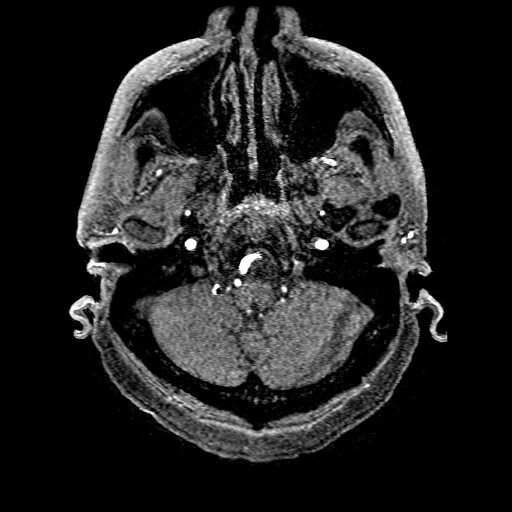
[im 55/184]
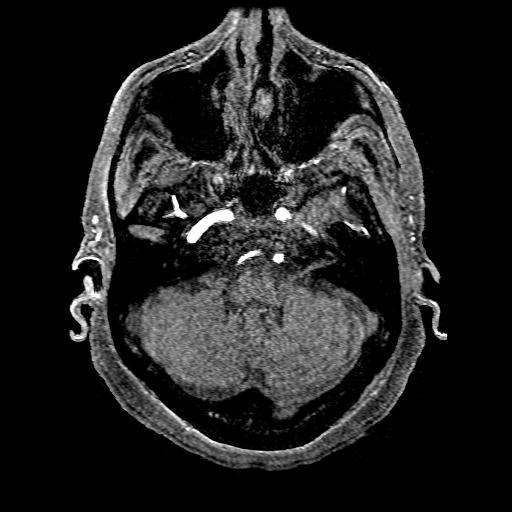
[im 74/184]
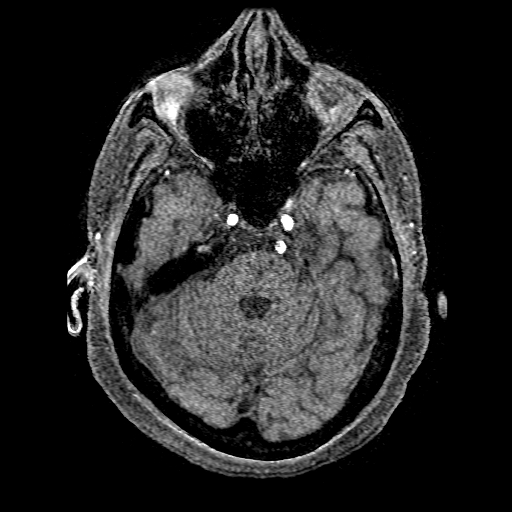
[im 92/184]
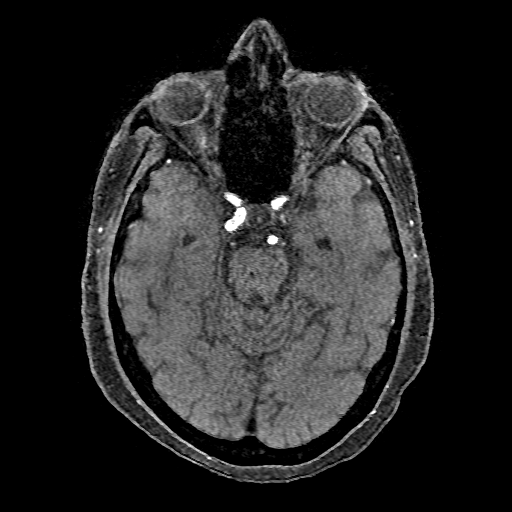
[im 110/184]
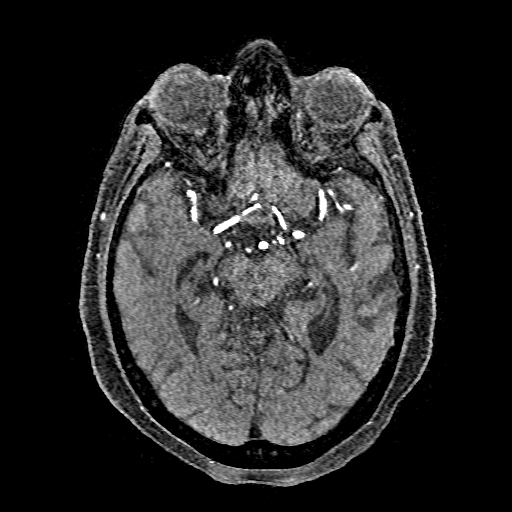
[im 129/184]
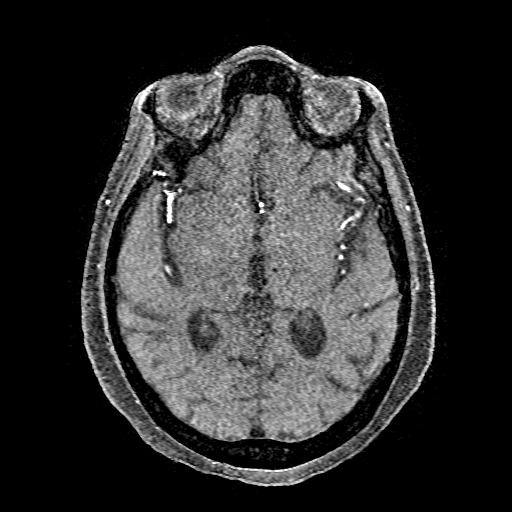
[im 147/184]
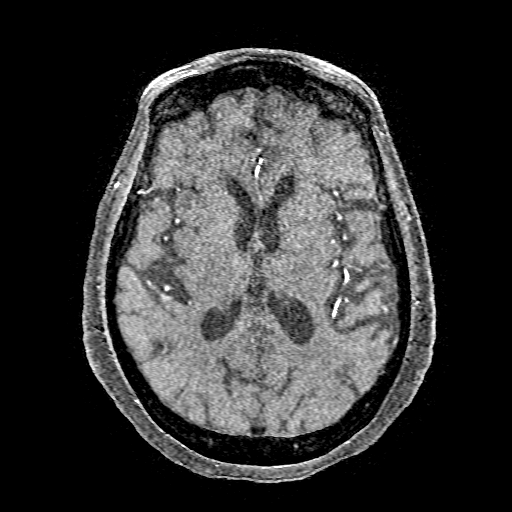
[im 165/184]
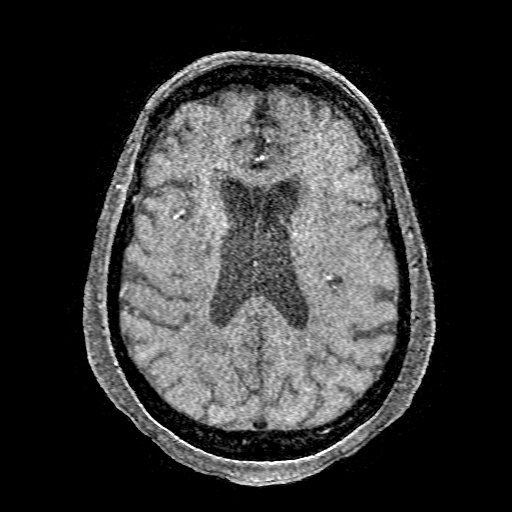
[im 184/184]
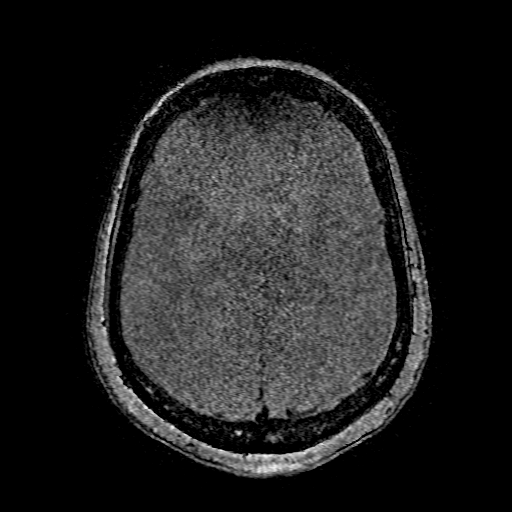

[Series 7: sag inhance (id) · sagittal · 1.2mm · 0.47mm/px · 7 of 440 slices shown]
[im 17/440]
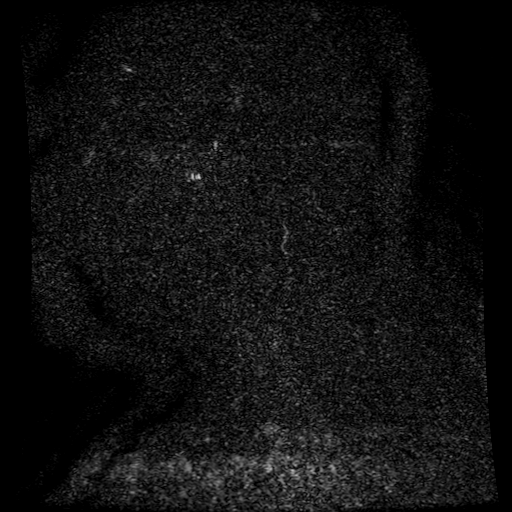
[im 68/440]
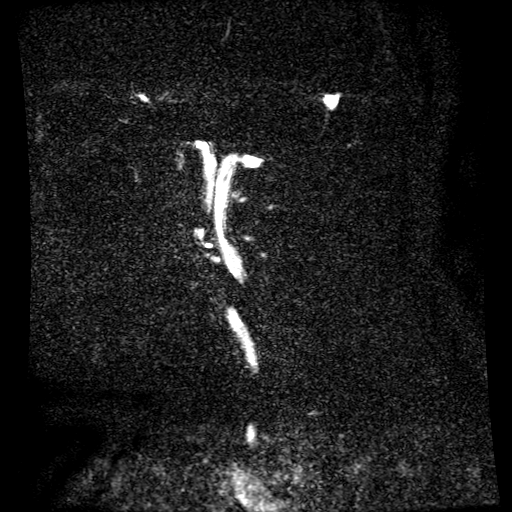
[im 85/440]
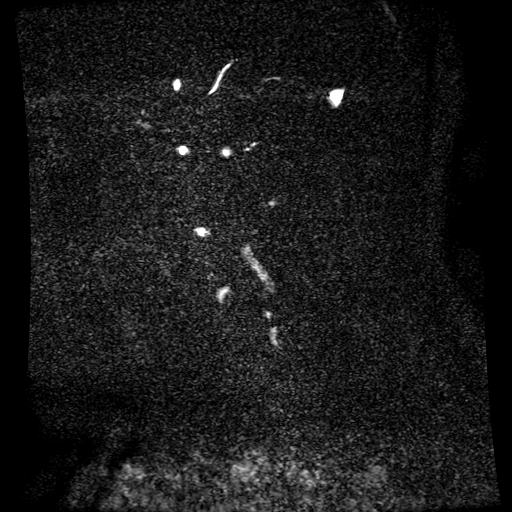
[im 136/440]
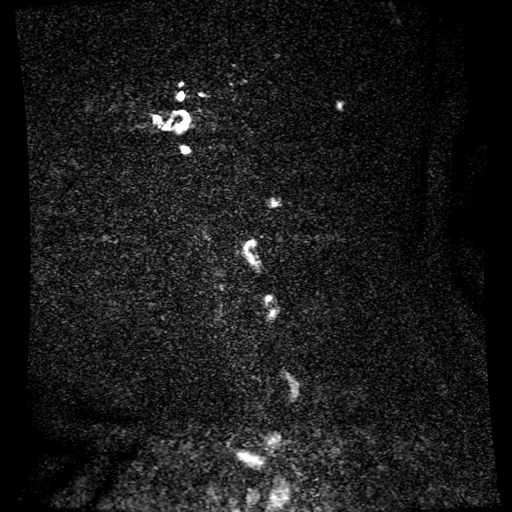
[im 186/440]
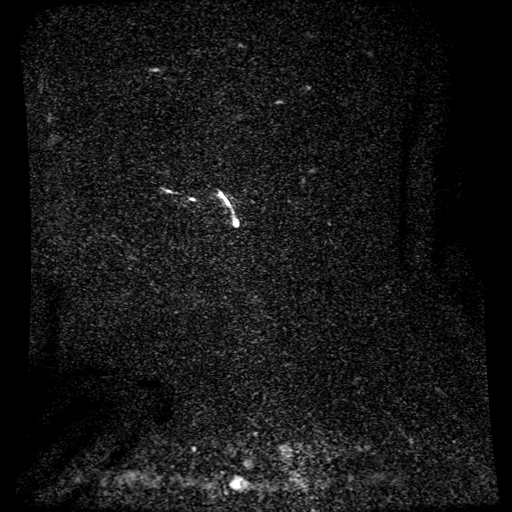
[im 220/440]
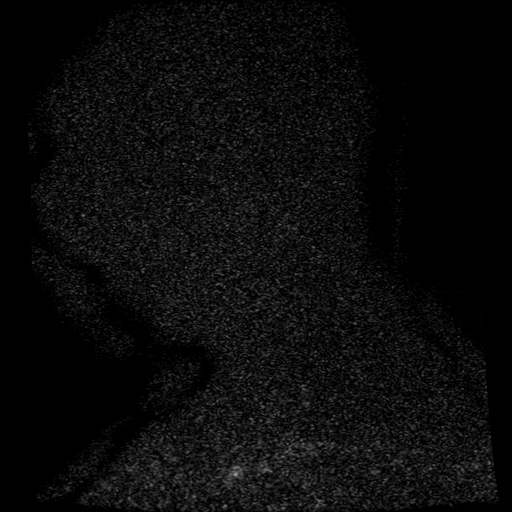
[im 372/440]
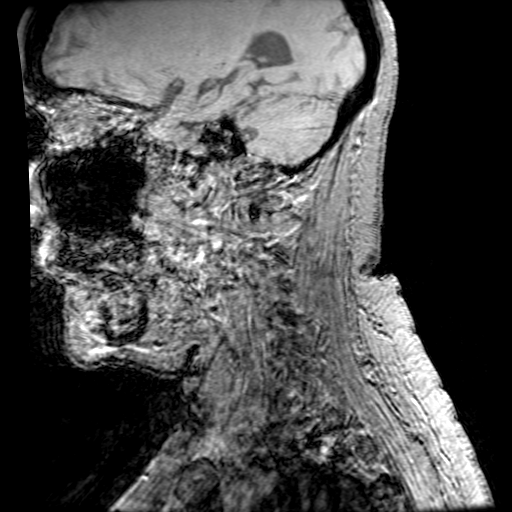

[18 of 48 positions shown; findings below may reference images not displayed]

FINDINGS: MRA NECK FINDINGS

Brachiocephalic vessel origins are poorly seen using noncontrast
technique. Both common carotid arteries are widely patent from the
lower neck to the bifurcation region. Both carotid bifurcations
appear widely patent without stenosis. Cervical internal carotid
arteries appear normal.

Antegrade flow is present in both vertebral arteries through the
cervical region. The left is slightly larger than the right. No
stenosis.

MRA HEAD FINDINGS

Both internal carotid arteries are patent through the skull base and
siphon regions. No siphon stenosis. The anterior and middle cerebral
vessels are patent without proximal stenosis, aneurysm or vascular
malformation. No missing distal branch vessels are identified.

Both vertebral arteries are patent through the foramen magnum with
the left being dominant. Both posterior inferior cerebellar arteries
show flow. There is no basilar stenosis. Superior cerebellar and
posterior cerebral arteries are patent.

There may be some distal vessel atherosclerotic irregularity of the
intracranial branch vessels, most severe in the left PCA territory
where there is diminished distal vessel demonstration.
IMPRESSION: Allowing for the technical limitations of a noncontrast MR
angiogram, no neck vessel abnormality is seen. Both carotid
bifurcations appear widely patent.

No intracranial large or medium vessel occlusion or correctable
proximal stenosis. Distal vessels show atherosclerotic irregularity,
particularly in the left PCA territory with there is diminished
distal vessel demonstration.

## 2020-11-22 ENCOUNTER — Emergency Department (HOSPITAL_COMMUNITY)
Admission: EM | Admit: 2020-11-22 | Discharge: 2020-11-22 | Disposition: A | Discharge status: Home / Self Care | Payer: Medicare Other | Attending: Emergency Medicine | Admitting: Emergency Medicine

## 2020-11-22 ENCOUNTER — Encounter (HOSPITAL_COMMUNITY): Payer: Self-pay

## 2020-11-22 ENCOUNTER — Emergency Department (HOSPITAL_COMMUNITY): Payer: Medicare Other

## 2020-11-22 DIAGNOSIS — N189 Chronic kidney disease, unspecified: Secondary | ICD-10-CM | POA: Insufficient documentation

## 2020-11-22 DIAGNOSIS — R1013 Epigastric pain: Secondary | ICD-10-CM | POA: Diagnosis present

## 2020-11-22 DIAGNOSIS — I129 Hypertensive chronic kidney disease with stage 1 through stage 4 chronic kidney disease, or unspecified chronic kidney disease: Secondary | ICD-10-CM | POA: Insufficient documentation

## 2020-11-22 DIAGNOSIS — K219 Gastro-esophageal reflux disease without esophagitis: Secondary | ICD-10-CM | POA: Diagnosis not present

## 2020-11-22 DIAGNOSIS — Z79899 Other long term (current) drug therapy: Secondary | ICD-10-CM | POA: Insufficient documentation

## 2020-11-22 DIAGNOSIS — R1010 Upper abdominal pain, unspecified: Secondary | ICD-10-CM

## 2020-11-22 LAB — BASIC METABOLIC PANEL
Anion gap: 10 (ref 5–15)
BUN: 42 mg/dL — ABNORMAL HIGH (ref 8–23)
CO2: 25 mmol/L (ref 22–32)
Calcium: 9 mg/dL (ref 8.9–10.3)
Chloride: 103 mmol/L (ref 98–111)
Creatinine, Ser: 1.86 mg/dL — ABNORMAL HIGH (ref 0.44–1.00)
GFR, Estimated: 29 mL/min — ABNORMAL LOW (ref >60)
Glucose, Bld: 150 mg/dL — ABNORMAL HIGH (ref 70–99)
Potassium: 4.7 mmol/L (ref 3.5–5.1)
Sodium: 138 mmol/L (ref 135–145)

## 2020-11-22 LAB — CBC
HCT: 34.2 % — ABNORMAL LOW (ref 36.0–46.0)
Hemoglobin: 11.4 g/dL — ABNORMAL LOW (ref 12.0–15.0)
MCH: 31.8 pg (ref 26.0–34.0)
MCHC: 33.3 g/dL (ref 30.0–36.0)
MCV: 95.5 fL (ref 80.0–100.0)
Platelets: 229 10*3/uL (ref 150–400)
RBC: 3.58 MIL/uL — ABNORMAL LOW (ref 3.87–5.11)
RDW: 13.7 % (ref 11.5–15.5)
WBC: 9.7 10*3/uL (ref 4.0–10.5)
nRBC: 0 % (ref 0.0–0.2)

## 2020-11-22 LAB — HEPATIC FUNCTION PANEL
ALT: 16 U/L (ref 0–44)
AST: 17 U/L (ref 15–41)
Albumin: 3.7 g/dL (ref 3.5–5.0)
Alkaline Phosphatase: 94 U/L (ref 38–126)
Bilirubin, Direct: <0.1 mg/dL (ref 0.0–0.2)
Total Bilirubin: 0.3 mg/dL (ref 0.3–1.2)
Total Protein: 7.2 g/dL (ref 6.5–8.1)

## 2020-11-22 LAB — TROPONIN I (HIGH SENSITIVITY)
Troponin I (High Sensitivity): 10 ng/L (ref <18)
Troponin I (High Sensitivity): 11 ng/L (ref <18)

## 2020-11-22 LAB — LIPASE, BLOOD: Lipase: 23 U/L (ref 11–51)

## 2020-11-22 MED ORDER — LIDOCAINE VISCOUS HCL 2 % MT SOLN: 15.0000 mL | Freq: Four times a day (QID) | OROMUCOSAL | 0 refills | Status: DC | PRN

## 2020-11-22 MED ORDER — OMEPRAZOLE 20 MG PO CPDR: 20.0000 mg | DELAYED_RELEASE_CAPSULE | Freq: Every day | ORAL | Status: DC

## 2020-11-22 MED ORDER — ALUM & MAG HYDROXIDE-SIMETH 200-200-20 MG/5ML PO SUSP
30.0000 mL | Freq: Once | ORAL | Status: AC
  Administered 2020-11-22: 30 mL via ORAL
  Filled 2020-11-22: qty 30

## 2020-11-22 MED ORDER — HYOSCYAMINE SULFATE 0.125 MG SL SUBL
0.2500 mg | SUBLINGUAL_TABLET | Freq: Once | SUBLINGUAL | Status: AC
  Administered 2020-11-22: 0.25 mg via SUBLINGUAL
  Filled 2020-11-22: qty 2

## 2020-11-22 MED ORDER — ALUM & MAG HYDROXIDE-SIMETH 400-400-40 MG/5ML PO SUSP: 15.0000 mL | Freq: Four times a day (QID) | ORAL | 0 refills | Status: DC | PRN

## 2020-11-22 MED ORDER — LIDOCAINE VISCOUS HCL 2 % MT SOLN
15.0000 mL | Freq: Once | OROMUCOSAL | Status: AC
  Administered 2020-11-22: 15 mL via ORAL
  Filled 2020-11-22: qty 15

## 2020-11-22 NOTE — ED Notes (Signed)
Pt verbalizes pain has improved from 10/10 to 7/10.

## 2020-11-22 NOTE — ED Provider Notes (Signed)
[facility] EMERGENCY DEPARTMENT Provider Note  CSN: [id number] Arrival date & time: 11/22/20 0128  Chief Complaint(s) Chest Pain  HPI COURTNI Graves is a 70 y.o. female with a past medical history listed below who presents to the emergency department with 2 weeks of intermittent epigastric/left upper quadrant abdominal pain. Described as "blowtorch" and cramping. Reports improvement with drinking water. No aggravating factors. No nausea, vomiting, or diarrhea. Still having normal BM. No cough or congestion.  Patient was seen and evaluated for the same complaints 4 days ago at [facility] had extensive work-up including CT and ultrasound of the abdomen which revealed evidence of constipation.  No obstruction.  No other serious intra-abdominal inflammatory/infectious process.  Instructed to use stool softeners.  HPI  Past Medical History Past Medical History:  Diagnosis Date  . Acute on chronic kidney failure (HCC)   . Anemia   . Arthritis   . Chest pain   . Diverticulitis   . Hypertension   . Stroke (HCC)   . UTI (urinary tract infection) 10/2019   Patient Active Problem List   Diagnosis Date Noted  . Bowel obstruction (HCC) 05/06/2020  . SIRS (systemic inflammatory response syndrome) (HCC) 05/06/2020  . UTI (urinary tract infection) 11/08/2019  . Major depressive disorder, recurrent episode, moderate (HCC) 11/08/2019  . Urinary retention 10/28/2019  . Acute on chronic kidney failure (HCC) 10/27/2019  . Malnutrition of moderate degree 10/03/2019  . Colitis 09/30/2019  . AKI (acute kidney injury) (HCC) 05/28/2019  . Nausea & vomiting 05/28/2019  . 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 Anemia, chronic disease 05/28/2019  . Swelling   . Small vessel disease, cerebrovascular   . Acute ischemic stroke (HCC)   . Weakness   . Acute left ankle pain   . Acute on chronic renal failure (HCC) 05/13/2019  . Weakness of both lower extremities 05/13/2019  . Cerebral  thrombosis with cerebral infarction 03/31/2019  . New onset a-fib (HCC) 03/27/2019  . Acute kidney injury superimposed on CKD (HCC) 03/24/2019  . Hypotension 03/24/2019  . Hematemesis 03/24/2019  . Dark stools 03/24/2019  . Diverticulitis of intestine without perforation or abscess without bleeding   . Sinus tachycardia 08/27/2016  . Diverticulitis 08/26/2016  . HYPERTENSION, MALIGNANT ESSENTIAL 06/24/2007  . KIDNEY DISEASE, CHRONIC, STAGE III 06/24/2007  . ARTHRITIS, RHEUMATOID, SEROPOSITIVE 06/24/2007   Home Medication(s) Prior to Admission medications   Medication Sig Start Date End Date Taking? Authorizing Provider  alum & mag hydroxide-simeth (MAALOX ADVANCED MAX ST) 400-400-40 MG/5ML suspension Take 15 mLs by mouth every 6 (six) hours as needed for indigestion. 11/22/20  Yes Averi Cacioppo, Amadeo Garnet, MD  lidocaine (XYLOCAINE) 2 % solution Use as directed 15 mLs in the mouth or throat every 6 (six) hours as needed (for severe stomach pain not relieved with Maalox). 11/22/20  Yes Mionna Advincula, Amadeo Garnet, MD  omeprazole (PRILOSEC) 20 MG capsule Take 1 capsule (20 mg total) by mouth daily. 11/22/20  Yes Kiaan Overholser, Amadeo Garnet, MD  diltiazem (CARDIZEM CD) 180 MG 24 hr capsule Take 1 capsule (180 mg total) by mouth daily. 05/11/20   Glade Lloyd, MD  docusate sodium (COLACE) 100 MG capsule Take 1 capsule (100 mg total) by mouth 2 (two) times daily. 05/11/20   Glade Lloyd, MD  doxepin (SINEQUAN) 50 MG capsule Take 10 mg by mouth daily. 05/02/20   [provider]  DULoxetine (CYMBALTA) 60 MG capsule Take 60 mg by mouth at bedtime.     [provider]  hydroxychloroquine (  PLAQUENIL) 200 MG tablet Take 200 mg by mouth 2 (two) times daily.  06/16/16   [provider]  metoprolol tartrate (LOPRESSOR) 25 MG tablet Take 1 tablet (25 mg total) by mouth 2 (two) times daily. 05/11/20   Glade Lloyd, MD  Multiple Vitamin (MULTIVITAMIN WITH MINERALS) TABS tablet Take 1 tablet by  mouth daily. 06/03/19   Shahmehdi, Gemma Payor, MD  MYRBETRIQ 25 MG TB24 tablet Take 25 mg by mouth daily. 10/18/19   [provider]  omeprazole (PRILOSEC) 20 MG capsule Take 20 mg by mouth daily as needed (acid reflux).  08/22/19   [provider]  ondansetron (ZOFRAN) 4 MG tablet Take 1 tablet (4 mg total) by mouth every 6 (six) hours as needed for nausea. Patient taking differently: Take 4 mg by mouth every 6 (six) hours as needed for nausea or vomiting.  04/04/19   Glade Lloyd, MD  polyethylene glycol (MIRALAX / GLYCOLAX) 17 g packet Take 17 g by mouth 2 (two) times daily. 05/11/20   Glade Lloyd, MD  prazosin (MINIPRESS) 1 MG capsule Take 1 mg by mouth at bedtime. 05/02/20   [provider]  predniSONE (DELTASONE) 10 MG tablet Take 5 mg by mouth daily. 05/02/20   [provider]  QUEtiapine (SEROQUEL) 25 MG tablet Take 25 mg by mouth daily. 05/02/20   [provider]  solifenacin (VESICARE) 5 MG tablet Take 5 mg by mouth daily. 05/02/20   [provider]                                                                                                                                    Past Surgical History Past Surgical History:  Procedure Laterality Date  . BIOPSY  04/01/2019   Procedure: BIOPSY;  Surgeon: Kathi Der, MD;  Location: [facility] ENDOSCOPY;  Service: Gastroenterology;;  . BIOPSY  10/05/2019   Procedure: BIOPSY;  Surgeon: Kathi Der, MD;  Location: [facility] ENDOSCOPY;  Service: Gastroenterology;;  . BIOPSY  05/10/2020   Procedure: BIOPSY;  Surgeon: Kathi Der, MD;  Location: [facility] ENDOSCOPY;  Service: Gastroenterology;;  . COLONOSCOPY WITH PROPOFOL N/A 04/01/2019   Procedure: COLONOSCOPY WITH PROPOFOL;  Surgeon: Kathi Der, MD;  Location: [facility] ENDOSCOPY;  Service: Gastroenterology;  Laterality: N/A;  . COLONOSCOPY WITH PROPOFOL N/A 10/05/2019   Procedure: COLONOSCOPY WITH PROPOFOL;  Surgeon: Kathi Der, MD;  Location: [facility]  ENDOSCOPY;  Service: Gastroenterology;  Laterality: N/A;  . COLONOSCOPY WITH PROPOFOL N/A 05/10/2020   Procedure: COLONOSCOPY WITH PROPOFOL;  Surgeon: Kathi Der, MD;  Location: [facility] ENDOSCOPY;  Service: Gastroenterology;  Laterality: N/A;  . ESOPHAGOGASTRODUODENOSCOPY (EGD) WITH PROPOFOL N/A 04/01/2019   Procedure: ESOPHAGOGASTRODUODENOSCOPY (EGD) WITH PROPOFOL;  Surgeon: Kathi Der, MD;  Location: [facility] ENDOSCOPY;  Service: Gastroenterology;  Laterality: N/A;  . ESOPHAGOGASTRODUODENOSCOPY (EGD) WITH PROPOFOL N/A 10/05/2019   Procedure: ESOPHAGOGASTRODUODENOSCOPY (EGD) WITH PROPOFOL;  Surgeon: Kathi Der, MD;  Location: [facility] ENDOSCOPY;  Service: Gastroenterology;  Laterality:  N/A;  . TONSILLECTOMY     Family History Family History  Problem Relation Age of Onset  . Hypertension Mother   . Diabetes Mother   . CAD Father        died of MI at age 58  . Hypertension Father   . Diabetes Sister   . Diabetes Sister   . Kidney disease Neg Hx     Social History Social History   Tobacco Use  . Smoking status: Never Smoker  . Smokeless tobacco: Never Used  Vaping Use  . Vaping Use: Never used  Substance Use Topics  . Alcohol use: Yes    Alcohol/week: 21.0 standard drinks    Types: 21 Glasses of wine per week    Comment: sometimes   . Drug use: No   Allergies Patient has no known allergies.  Review of Systems Review of Systems All other systems are reviewed and are negative for acute change except as noted in the HPI  Physical Exam Vital Signs  I have reviewed the triage vital signs BP (!) 145/97   Pulse 85   Temp 98.1 F (36.7 C) (Oral)   Resp (!) 22   SpO2 97%   Physical Exam Vitals reviewed.  Constitutional:      General: She is not in acute distress.    Appearance: She is well-developed and well-nourished. She is not diaphoretic.  HENT:     Head: Normocephalic and atraumatic.     Nose: Nose normal.  Eyes:     General: No scleral icterus.       Right  eye: No discharge.        Left eye: No discharge.     Extraocular Movements: EOM normal.     Conjunctiva/sclera: Conjunctivae normal.     Pupils: Pupils are equal, round, and reactive to light.  Cardiovascular:     Rate and Rhythm: Normal rate and regular rhythm.     Heart sounds: No murmur heard. No friction rub. No gallop.   Pulmonary:     Effort: Pulmonary effort is normal. No respiratory distress.     Breath sounds: Normal breath sounds. No stridor. No rales.  Abdominal:     General: There is no distension.     Palpations: Abdomen is soft.     Tenderness: There is abdominal tenderness in the epigastric area and left upper quadrant. There is no guarding or rebound. Negative signs include Murphy's sign.  Musculoskeletal:        General: No tenderness or edema.     Cervical back: Normal range of motion and neck supple.  Skin:    General: Skin is warm and dry.     Findings: No erythema or rash.  Neurological:     Mental Status: She is alert and oriented to person, place, and time.  Psychiatric:        Mood and Affect: Mood and affect normal.     ED Results and Treatments Labs (all labs ordered are listed, but only abnormal results are displayed) Labs Reviewed  BASIC METABOLIC PANEL - Abnormal; Notable for the following components:      Result Value   Glucose, Bld 150 (*)    BUN 42 (*)    Creatinine, Ser 1.86 (*)    GFR, Estimated 29 (*)    All other components within normal limits  CBC - Abnormal; Notable for the following components:   RBC 3.58 (*)    Hemoglobin 11.4 (*)    HCT 34.2 (*)  All other components within normal limits  LIPASE, BLOOD  HEPATIC FUNCTION PANEL  TROPONIN I (HIGH SENSITIVITY)  TROPONIN I (HIGH SENSITIVITY)                                                                                                                         EKG  EKG Interpretation  Date/Time:  Thursday November 22 2020 01:32:01 EST Ventricular Rate:  83 PR  Interval:  194 QRS Duration: 94 QT Interval:  388 QTC Calculation: 455 R Axis:   -7 Text Interpretation: Normal sinus rhythm Moderate voltage criteria for LVH, may be normal variant ( R in aVL , Cornell product ) Anterolateral infarct , age undetermined Abnormal ECG When compared with ECG of 09/26/2020, No significant change was found Confirmed by Dione Booze ([id number]) on 11/22/2020 1:47:30 AM      Radiology DG Chest 2 View  Result Date: 11/22/2020 CLINICAL DATA:  Chest pain EXAM: CHEST - 2 VIEW COMPARISON:  September 26, 2020 FINDINGS: The heart size is stable. The aorta remains dilated as before. The lung volumes are low. Bibasilar airspace disease is noted favored to represent atelectasis. There is no pneumothorax. No acute osseous abnormality. IMPRESSION: Low lung volumes with bibasilar atelectasis. Electronically Signed   By: Katherine Mantle M.D.   On: 11/22/2020 02:02    Pertinent labs & imaging results that were available during my care of the patient were reviewed by me and considered in my medical decision making (see chart for details).  Medications Ordered in ED Medications  alum & mag hydroxide-simeth (MAALOX/MYLANTA) 200-200-20 MG/5ML suspension 30 mL (30 mLs Oral Given 11/22/20 0522)    And  lidocaine (XYLOCAINE) 2 % viscous mouth solution 15 mL (15 mLs Oral Given 11/22/20 0522)  hyoscyamine (LEVSIN SL) SL tablet 0.25 mg (0.25 mg Sublingual Given 11/22/20 0522)                                                                                                                                    Procedures Procedures  (including critical care time)  Medical Decision Making / ED Course I have reviewed the nursing notes for this encounter and the patient's prior records (if available in EHR or on provided paperwork).   Michelle [facility] was evaluated in Emergency Department on 11/22/2020 for the symptoms described in the history of present illness. She was evaluated in the context of  the  global COVID-19 pandemic, which necessitated consideration that the patient might be at risk for infection with the SARS-CoV-2 virus that causes COVID-19. Institutional protocols and algorithms that pertain to the evaluation of patients at risk for COVID-19 are in a state of rapid change based on information released by regulatory bodies including the CDC and federal and state organizations. These policies and algorithms were followed during the patient's care in the ED.  Work up is reassuring with improving WBC compared to OSH CBC. No evidence of bili obstruction or pancreatitis.  EKG without acute ischemic changes or evidence of pericarditis.  Serial troponins negative x2. Chest x-ray without evidence suggestive of pneumonia, pneumothorax, pneumomediastinum.  No abnormal contour of the mediastinum to suggest dissection. No evidence of acute injuries.  Symptoms appear to be GI related.  Doubt cardiac process.  Presentation is not classic for aortic dissection or esophageal perforation.  Treated with a GI cocktail resulting in complete resolution of her symptoms.  She is tolerating oral intake.  Low suspicion for serious intra-abdominal, torsades infectious process or bowel obstruction.      Final Clinical Impression(s) / ED Diagnoses Final diagnoses:  Upper abdominal pain  Gastroesophageal reflux disease, unspecified whether esophagitis present   The patient appears reasonably screened and/or stabilized for discharge and I doubt any other medical condition or other [facility] requiring further screening, evaluation, or treatment in the ED at this time prior to discharge. Safe for discharge with strict return precautions.  Disposition: Discharge  Condition: Good  I have discussed the results, Dx and Tx plan with the patient/family who expressed understanding and agree(s) with the plan. Discharge instructions discussed at length. The patient/family was given strict return precautions who  verbalized understanding of the instructions. No further questions at time of discharge.    ED Discharge Orders         Ordered    alum & mag hydroxide-simeth (MAALOX ADVANCED MAX ST) 400-400-40 MG/5ML suspension  Every 6 hours PRN        11/22/20 0702    lidocaine (XYLOCAINE) 2 % solution  Every 6 hours PRN        11/22/20 0702    omeprazole (PRILOSEC) 20 MG capsule  Daily        11/22/20 0702            Follow Up: Associates, [facility] [address] [place] [address] [phone]  Call  As needed      This chart was dictated using voice recognition software.  Despite best efforts to proofread,  errors can occur which can change the documentation meaning.   Nira Conn, MD 11/22/20 [phone]

## 2020-11-22 NOTE — ED Triage Notes (Signed)
Pt comes via [facility] for CP that has been going on for several weeks, hx of GERD.

## 2020-11-26 ENCOUNTER — Other Ambulatory Visit: Payer: Self-pay

## 2020-11-26 ENCOUNTER — Emergency Department (HOSPITAL_COMMUNITY): Payer: Medicare Other

## 2020-11-26 ENCOUNTER — Inpatient Hospital Stay (HOSPITAL_COMMUNITY)
Admission: EM | Admit: 2020-11-26 | Discharge: 2020-12-01 | DRG: 389 | Disposition: A | Discharge status: Home / Self Care | Payer: Medicare Other | Attending: Internal Medicine | Admitting: Internal Medicine

## 2020-11-26 DIAGNOSIS — Z8673 Personal history of transient ischemic attack (TIA), and cerebral infarction without residual deficits: Secondary | ICD-10-CM

## 2020-11-26 DIAGNOSIS — Z8619 Personal history of other infectious and parasitic diseases: Secondary | ICD-10-CM

## 2020-11-26 DIAGNOSIS — M069 Rheumatoid arthritis, unspecified: Secondary | ICD-10-CM | POA: Diagnosis not present

## 2020-11-26 DIAGNOSIS — Z6830 Body mass index (BMI) 30.0-30.9, adult: Secondary | ICD-10-CM

## 2020-11-26 DIAGNOSIS — Z7952 Long term (current) use of systemic steroids: Secondary | ICD-10-CM

## 2020-11-26 DIAGNOSIS — K640 First degree hemorrhoids: Secondary | ICD-10-CM | POA: Diagnosis present

## 2020-11-26 DIAGNOSIS — Z20822 Contact with and (suspected) exposure to covid-19: Secondary | ICD-10-CM | POA: Diagnosis not present

## 2020-11-26 DIAGNOSIS — N179 Acute kidney failure, unspecified: Secondary | ICD-10-CM | POA: Diagnosis not present

## 2020-11-26 DIAGNOSIS — E861 Hypovolemia: Secondary | ICD-10-CM | POA: Diagnosis present

## 2020-11-26 DIAGNOSIS — K5730 Diverticulosis of large intestine without perforation or abscess without bleeding: Secondary | ICD-10-CM | POA: Diagnosis present

## 2020-11-26 DIAGNOSIS — M1990 Unspecified osteoarthritis, unspecified site: Secondary | ICD-10-CM | POA: Diagnosis present

## 2020-11-26 DIAGNOSIS — F39 Unspecified mood [affective] disorder: Secondary | ICD-10-CM | POA: Diagnosis not present

## 2020-11-26 DIAGNOSIS — K8020 Calculus of gallbladder without cholecystitis without obstruction: Secondary | ICD-10-CM | POA: Diagnosis present

## 2020-11-26 DIAGNOSIS — N1832 Chronic kidney disease, stage 3b: Secondary | ICD-10-CM | POA: Diagnosis present

## 2020-11-26 DIAGNOSIS — K529 Noninfective gastroenteritis and colitis, unspecified: Secondary | ICD-10-CM | POA: Diagnosis present

## 2020-11-26 DIAGNOSIS — Z7982 Long term (current) use of aspirin: Secondary | ICD-10-CM

## 2020-11-26 DIAGNOSIS — E860 Dehydration: Secondary | ICD-10-CM | POA: Diagnosis present

## 2020-11-26 DIAGNOSIS — Z87440 Personal history of urinary (tract) infections: Secondary | ICD-10-CM

## 2020-11-26 DIAGNOSIS — R159 Full incontinence of feces: Secondary | ICD-10-CM | POA: Diagnosis present

## 2020-11-26 DIAGNOSIS — R109 Unspecified abdominal pain: Secondary | ICD-10-CM | POA: Diagnosis present

## 2020-11-26 DIAGNOSIS — E669 Obesity, unspecified: Secondary | ICD-10-CM | POA: Diagnosis not present

## 2020-11-26 DIAGNOSIS — Z8249 Family history of ischemic heart disease and other diseases of the circulatory system: Secondary | ICD-10-CM

## 2020-11-26 DIAGNOSIS — I129 Hypertensive chronic kidney disease with stage 1 through stage 4 chronic kidney disease, or unspecified chronic kidney disease: Secondary | ICD-10-CM | POA: Diagnosis present

## 2020-11-26 DIAGNOSIS — K56699 Other intestinal obstruction unspecified as to partial versus complete obstruction: Principal | ICD-10-CM

## 2020-11-26 DIAGNOSIS — Z79899 Other long term (current) drug therapy: Secondary | ICD-10-CM | POA: Diagnosis not present

## 2020-11-26 DIAGNOSIS — K635 Polyp of colon: Secondary | ICD-10-CM | POA: Diagnosis present

## 2020-11-26 LAB — LIPASE, BLOOD: Lipase: 25 U/L (ref 11–51)

## 2020-11-26 LAB — COMPREHENSIVE METABOLIC PANEL
ALT: 15 U/L (ref 0–44)
AST: 17 U/L (ref 15–41)
Albumin: 3.6 g/dL (ref 3.5–5.0)
Alkaline Phosphatase: 87 U/L (ref 38–126)
Anion gap: 11 (ref 5–15)
BUN: 50 mg/dL — ABNORMAL HIGH (ref 8–23)
CO2: 24 mmol/L (ref 22–32)
Calcium: 9.6 mg/dL (ref 8.9–10.3)
Chloride: 99 mmol/L (ref 98–111)
Creatinine, Ser: 2.38 mg/dL — ABNORMAL HIGH (ref 0.44–1.00)
GFR, Estimated: 22 mL/min — ABNORMAL LOW (ref >60)
Glucose, Bld: 127 mg/dL — ABNORMAL HIGH (ref 70–99)
Potassium: 5 mmol/L (ref 3.5–5.1)
Sodium: 134 mmol/L — ABNORMAL LOW (ref 135–145)
Total Bilirubin: 0.8 mg/dL (ref 0.3–1.2)
Total Protein: 6.8 g/dL (ref 6.5–8.1)

## 2020-11-26 LAB — CBC
HCT: 39.8 % (ref 36.0–46.0)
Hemoglobin: 12.5 g/dL (ref 12.0–15.0)
MCH: 30.7 pg (ref 26.0–34.0)
MCHC: 31.4 g/dL (ref 30.0–36.0)
MCV: 97.8 fL (ref 80.0–100.0)
Platelets: 247 10*3/uL (ref 150–400)
RBC: 4.07 MIL/uL (ref 3.87–5.11)
RDW: 13.8 % (ref 11.5–15.5)
WBC: 16.1 10*3/uL — ABNORMAL HIGH (ref 4.0–10.5)
nRBC: 0 % (ref 0.0–0.2)

## 2020-11-26 LAB — HIV ANTIBODY (ROUTINE TESTING W REFLEX): HIV Screen 4th Generation wRfx: NONREACTIVE

## 2020-11-26 LAB — SARS CORONAVIRUS 2 BY RT PCR (HOSPITAL ORDER, PERFORMED IN ~~LOC~~ HOSPITAL LAB): SARS Coronavirus 2: NEGATIVE

## 2020-11-26 MED ORDER — PEG-KCL-NACL-NASULF-NA ASC-C 100 G PO SOLR
0.5000 | Freq: Once | ORAL | Status: AC
  Administered 2020-11-27: 100 g via ORAL
  Filled 2020-11-26: qty 1

## 2020-11-26 MED ORDER — PEG-KCL-NACL-NASULF-NA ASC-C 100 G PO SOLR: 1.0000 | Freq: Once | ORAL | Status: DC

## 2020-11-26 MED ORDER — PREDNISONE 5 MG PO TABS
5.0000 mg | ORAL_TABLET | Freq: Every day | ORAL | Status: DC
  Administered 2020-11-27 – 2020-12-01 (×6): 5 mg via ORAL
  Filled 2020-11-26 (×7): qty 1

## 2020-11-26 MED ORDER — SODIUM CHLORIDE 0.9 % IV SOLN: INTRAVENOUS | Status: DC

## 2020-11-26 MED ORDER — DARIFENACIN HYDROBROMIDE ER 7.5 MG PO TB24
7.5000 mg | ORAL_TABLET | Freq: Every day | ORAL | Status: DC
  Administered 2020-11-27 – 2020-12-01 (×6): 7.5 mg via ORAL
  Filled 2020-11-26 (×6): qty 1

## 2020-11-26 MED ORDER — PANTOPRAZOLE SODIUM 40 MG PO TBEC
40.0000 mg | DELAYED_RELEASE_TABLET | Freq: Every day | ORAL | Status: DC
  Administered 2020-11-27 – 2020-12-01 (×6): 40 mg via ORAL
  Filled 2020-11-26 (×6): qty 1

## 2020-11-26 MED ORDER — SODIUM CHLORIDE 0.9 % IV BOLUS
1000.0000 mL | Freq: Once | INTRAVENOUS | Status: AC
  Administered 2020-11-26: 1000 mL via INTRAVENOUS

## 2020-11-26 MED ORDER — POLYETHYLENE GLYCOL 3350 17 G PO PACK
17.0000 g | PACK | Freq: Two times a day (BID) | ORAL | Status: DC
  Administered 2020-11-27 – 2020-11-28 (×3): 17 g via ORAL
  Filled 2020-11-26 (×4): qty 1

## 2020-11-26 MED ORDER — DOCUSATE SODIUM 100 MG PO CAPS
100.0000 mg | ORAL_CAPSULE | Freq: Two times a day (BID) | ORAL | Status: DC
  Administered 2020-11-27 – 2020-12-01 (×7): 100 mg via ORAL
  Filled 2020-11-26 (×9): qty 1

## 2020-11-26 MED ORDER — IOHEXOL 9 MG/ML PO SOLN
500.0000 mL | ORAL | Status: AC
  Administered 2020-11-26: 500 mL via ORAL

## 2020-11-26 MED ORDER — DILTIAZEM HCL ER COATED BEADS 180 MG PO CP24
180.0000 mg | ORAL_CAPSULE | Freq: Every day | ORAL | Status: DC
  Administered 2020-11-27 – 2020-12-01 (×6): 180 mg via ORAL
  Filled 2020-11-26 (×7): qty 1

## 2020-11-26 MED ORDER — METOPROLOL TARTRATE 25 MG PO TABS
25.0000 mg | ORAL_TABLET | Freq: Two times a day (BID) | ORAL | Status: DC
  Administered 2020-11-27 – 2020-11-29 (×6): 25 mg via ORAL
  Filled 2020-11-26 (×6): qty 1

## 2020-11-26 MED ORDER — HYDROXYCHLOROQUINE SULFATE 200 MG PO TABS
200.0000 mg | ORAL_TABLET | Freq: Two times a day (BID) | ORAL | Status: DC
  Administered 2020-11-27 – 2020-12-01 (×9): 200 mg via ORAL
  Filled 2020-11-26 (×11): qty 1

## 2020-11-26 MED ORDER — DOXEPIN HCL 10 MG PO CAPS
10.0000 mg | ORAL_CAPSULE | Freq: Every day | ORAL | Status: DC
  Administered 2020-11-27 – 2020-12-01 (×6): 10 mg via ORAL
  Filled 2020-11-26 (×6): qty 1

## 2020-11-26 MED ORDER — PRAZOSIN HCL 1 MG PO CAPS
1.0000 mg | ORAL_CAPSULE | Freq: Every day | ORAL | Status: DC
  Administered 2020-11-27 – 2020-11-28 (×3): 1 mg via ORAL
  Filled 2020-11-26 (×4): qty 1

## 2020-11-26 MED ORDER — LIDOCAINE VISCOUS HCL 2 % MT SOLN: 15.0000 mL | Freq: Four times a day (QID) | OROMUCOSAL | Status: DC | PRN

## 2020-11-26 MED ORDER — ONDANSETRON HCL 4 MG PO TABS
4.0000 mg | ORAL_TABLET | Freq: Four times a day (QID) | ORAL | Status: DC | PRN
  Administered 2020-11-27 – 2020-11-30 (×2): 4 mg via ORAL
  Filled 2020-11-26 (×2): qty 1

## 2020-11-26 MED ORDER — QUETIAPINE FUMARATE 25 MG PO TABS
25.0000 mg | ORAL_TABLET | Freq: Every day | ORAL | Status: DC
Start: 2020-11-26 — End: 2020-12-01
  Administered 2020-11-27 – 2020-12-01 (×6): 25 mg via ORAL
  Filled 2020-11-26 (×7): qty 1

## 2020-11-26 MED ORDER — DULOXETINE HCL 60 MG PO CPEP
60.0000 mg | ORAL_CAPSULE | Freq: Every day | ORAL | Status: DC
  Administered 2020-11-27 – 2020-11-30 (×4): 60 mg via ORAL
  Filled 2020-11-26 (×5): qty 1

## 2020-11-26 MED ORDER — MIRABEGRON ER 25 MG PO TB24
25.0000 mg | ORAL_TABLET | Freq: Every day | ORAL | Status: DC
  Administered 2020-11-27 – 2020-12-01 (×6): 25 mg via ORAL
  Filled 2020-11-26 (×6): qty 1

## 2020-11-26 MED ORDER — HEPARIN SODIUM (PORCINE) 5000 UNIT/ML IJ SOLN
5000.0000 [IU] | Freq: Three times a day (TID) | INTRAMUSCULAR | Status: DC
  Administered 2020-11-27 – 2020-11-28 (×5): 5000 [IU] via SUBCUTANEOUS
  Filled 2020-11-26 (×5): qty 1

## 2020-11-26 NOTE — Consult Note (Signed)
Referring Provider: ED Primary Care Physician:  [facility] Primary Gastroenterologist:  Dr. Levora [facility])  Reason for Consultation:  Colonic stricture  HPI: Michelle Graves is a 70 y.o. female with history of CMV colitis, CVA, and CKD presenting for abnormal CT of the cecum.  Patient presented to the ED with abdominal pain today.  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 Three large chronic lamellated stool balls proximal to this stricturing.  Patient reports abdominal pain for the last several weeks, worse on the left side.  She has had intermittent loose stools for the last couple of weeks as well, and some intermittent fecal incontinence.  Denies any melena or hematochezia.  She has also had a couple episodes of nonbloody emesis over the last 2 to 3 days.  She states her appetite is also decreased.  She does not believe she has lost any weight recently, however.  She takes 81 mg aspirin daily.  Denies blood thinner or other NSAID use.  Denies family history of colon cancer or gastrointestinal malignancy.  Colonoscopy 05/10/20:  Preparation of the colon was poor. Stool in the entire examined colon. Congested, eroded, friable (with contact bleeding), inflamed and ulcerated mucosa in the transverse colon (bx: severe acute non-specific colitis, negative for CMV and HSV). Stricture at the splenic flexure. Diverticulosis in the sigmoid colon. Congested, erythematous and inflamed mucosa in the rectum (bx: mild acute non-specific colitis). Internal hemorrhoids.  Colonoscopy 05/09/20 attempted but not completed as there was solid and liquid stool in rectum and sigmoid colon.  CMV colitis in 04/2019.  Flex sig [facility]) 04/2019: Diffuse severe inflammation was found from 35 to 60 cm proximal to the anus  Past Medical History:  Diagnosis Date  . Acute on chronic kidney failure (HCC)   . Anemia   . Arthritis   . Chest pain    . Diverticulitis   . Hypertension   . Stroke (HCC)   . UTI (urinary tract infection) 10/2019    Past Surgical History:  Procedure Laterality Date  . BIOPSY  04/01/2019   Procedure: BIOPSY;  Surgeon: Kathi Der, MD;  Location: [facility] ENDOSCOPY;  Service: Gastroenterology;;  . BIOPSY  10/05/2019   Procedure: BIOPSY;  Surgeon: Kathi Der, MD;  Location: [facility] ENDOSCOPY;  Service: Gastroenterology;;  . BIOPSY  05/10/2020   Procedure: BIOPSY;  Surgeon: Kathi Der, MD;  Location: [facility] ENDOSCOPY;  Service: Gastroenterology;;  . COLONOSCOPY WITH PROPOFOL N/A 04/01/2019   Procedure: COLONOSCOPY WITH PROPOFOL;  Surgeon: Kathi Der, MD;  Location: [facility] ENDOSCOPY;  Service: Gastroenterology;  Laterality: N/A;  . COLONOSCOPY WITH PROPOFOL N/A 10/05/2019   Procedure: COLONOSCOPY WITH PROPOFOL;  Surgeon: Kathi Der, MD;  Location: [facility] ENDOSCOPY;  Service: Gastroenterology;  Laterality: N/A;  . COLONOSCOPY WITH PROPOFOL N/A 05/10/2020   Procedure: COLONOSCOPY WITH PROPOFOL;  Surgeon: Kathi Der, MD;  Location: [facility] ENDOSCOPY;  Service: Gastroenterology;  Laterality: N/A;  . ESOPHAGOGASTRODUODENOSCOPY (EGD) WITH PROPOFOL N/A 04/01/2019   Procedure: ESOPHAGOGASTRODUODENOSCOPY (EGD) WITH PROPOFOL;  Surgeon: Kathi Der, MD;  Location: [facility] ENDOSCOPY;  Service: Gastroenterology;  Laterality: N/A;  . ESOPHAGOGASTRODUODENOSCOPY (EGD) WITH PROPOFOL N/A 10/05/2019   Procedure: ESOPHAGOGASTRODUODENOSCOPY (EGD) WITH PROPOFOL;  Surgeon: Kathi Der, MD;  Location: [facility] ENDOSCOPY;  Service: Gastroenterology;  Laterality: N/A;  . TONSILLECTOMY      Prior to Admission medications   Medication Sig Start Date End Date Taking? Authorizing Provider  alum & mag hydroxide-simeth (MAALOX ADVANCED MAX ST) 400-400-40 MG/5ML suspension Take  15 mLs by mouth every 6 (six) hours as needed for indigestion. 11/22/20   Nira Conn, MD  diltiazem (CARDIZEM CD) 180 MG 24 hr capsule Take 1 capsule  (180 mg total) by mouth daily. 05/11/20   Glade Lloyd, MD  docusate sodium (COLACE) 100 MG capsule Take 1 capsule (100 mg total) by mouth 2 (two) times daily. 05/11/20   Glade Lloyd, MD  doxepin (SINEQUAN) 50 MG capsule Take 10 mg by mouth daily. 05/02/20   [provider]  DULoxetine (CYMBALTA) 60 MG capsule Take 60 mg by mouth at bedtime.     [provider]  hydroxychloroquine (PLAQUENIL) 200 MG tablet Take 200 mg by mouth 2 (two) times daily.  06/16/16   [provider]  lidocaine (XYLOCAINE) 2 % solution Use as directed 15 mLs in the mouth or throat every 6 (six) hours as needed (for severe stomach pain not relieved with Maalox). 11/22/20   Nira Conn, MD  metoprolol tartrate (LOPRESSOR) 25 MG tablet Take 1 tablet (25 mg total) by mouth 2 (two) times daily. 05/11/20   Glade Lloyd, MD  Multiple Vitamin (MULTIVITAMIN WITH MINERALS) TABS tablet Take 1 tablet by mouth daily. 06/03/19   Shahmehdi, Gemma Payor, MD  MYRBETRIQ 25 MG TB24 tablet Take 25 mg by mouth daily. 10/18/19   [provider]  omeprazole (PRILOSEC) 20 MG capsule Take 20 mg by mouth daily as needed (acid reflux).  08/22/19   [provider]  omeprazole (PRILOSEC) 20 MG capsule Take 1 capsule (20 mg total) by mouth daily. 11/22/20   Nira Conn, MD  ondansetron (ZOFRAN) 4 MG tablet Take 1 tablet (4 mg total) by mouth every 6 (six) hours as needed for nausea. Patient taking differently: Take 4 mg by mouth every 6 (six) hours as needed for nausea or vomiting.  04/04/19   Glade Lloyd, MD  polyethylene glycol (MIRALAX / GLYCOLAX) 17 g packet Take 17 g by mouth 2 (two) times daily. 05/11/20   Glade Lloyd, MD  prazosin (MINIPRESS) 1 MG capsule Take 1 mg by mouth at bedtime. 05/02/20   [provider]  predniSONE (DELTASONE) 10 MG tablet Take 5 mg by mouth daily. 05/02/20   [provider]  QUEtiapine (SEROQUEL) 25 MG tablet Take 25 mg by mouth daily. 05/02/20    [provider]  solifenacin (VESICARE) 5 MG tablet Take 5 mg by mouth daily. 05/02/20   [provider]    Scheduled Meds: Continuous Infusions: PRN Meds:.  Allergies as of 11/26/2020  . (No Known Allergies)    Family History  Problem Relation Age of Onset  . Hypertension Mother   . Diabetes Mother   . CAD Father        died of MI at age 52  . Hypertension Father   . Diabetes Sister   . Diabetes Sister   . Kidney disease Neg Hx     Social History   Socioeconomic History  . Marital status: Divorced    Spouse name: Not on file  . Number of children: Not on file  . Years of education: Not on file  . Highest education level: Not on file  Occupational History  . Not on file  Tobacco Use  . Smoking status: Never Smoker  . Smokeless tobacco: Never Used  Vaping Use  . Vaping Use: Never used  Substance and Sexual Activity  . Alcohol use: Yes    Alcohol/week: 21.0 standard drinks    Types: 21 Glasses  of wine per week    Comment: sometimes   . Drug use: No  . Sexual activity: Yes    Partners: Male    Birth control/protection: Condom  Other Topics Concern  . Not on file  Social History Narrative  . Not on file   Social Determinants of Health   Financial Resource Strain: Not on file  Food Insecurity: Not on file  Transportation Needs: Not on file  Physical Activity: Not on file  Stress: Not on file  Social Connections: Not on file  Intimate Partner Violence: Not on file    Review of Systems: Review of Systems  Constitutional: Positive for malaise/fatigue. Negative for chills and fever.  HENT: Negative for hearing loss and tinnitus.   Eyes: Negative for pain and redness.  Respiratory: Negative for cough and shortness of breath.   Cardiovascular: Negative for chest pain and palpitations.  Gastrointestinal: Positive for abdominal pain, diarrhea, nausea and vomiting. Negative for blood in stool, constipation, heartburn and melena.  Genitourinary:  Negative for flank pain and hematuria.  Musculoskeletal: Negative for falls and joint pain.  Skin: Negative for itching and rash.  Neurological: Negative for seizures and loss of consciousness.  Endo/Heme/Allergies: Negative for polydipsia. Does not bruise/bleed easily.  Psychiatric/Behavioral: Negative for substance abuse. The patient is not nervous/anxious.      Physical Exam: Physical Exam Vitals reviewed.  HENT:     Head: Normocephalic and atraumatic.     Nose: Nose normal. No congestion.     Mouth/Throat:     Mouth: Mucous membranes are moist.     Pharynx: Oropharynx is clear.  Eyes:     General: No scleral icterus.    Extraocular Movements: Extraocular movements intact.     Conjunctiva/sclera: Conjunctivae normal.  Cardiovascular:     Rate and Rhythm: Normal rate and regular rhythm.     Pulses: Normal pulses.  Pulmonary:     Effort: Pulmonary effort is normal. No respiratory distress.     Breath sounds: Normal breath sounds.  Abdominal:     General: Bowel sounds are normal. There is distension (moderate).     Palpations: Abdomen is soft. There is no mass.     Tenderness: There is abdominal tenderness (mild, diffuse). There is no guarding or rebound.     Hernia: No hernia is present.  Musculoskeletal:        General: No swelling or tenderness.     Cervical back: Normal range of motion and neck supple.  Skin:    General: Skin is warm and dry.  Neurological:     General: No focal deficit present.     Mental Status: She is oriented to person, place, and time. She is lethargic.  Psychiatric:        Mood and Affect: Mood normal.        Behavior: Behavior normal. Behavior is cooperative.     Vital signs: Vitals:   11/26/20 1315 11/26/20 1400  BP: (!) 141/94 116/65  Pulse: 71 70  Resp: 14 15  Temp:    SpO2: 98% 100%     GI:  Lab Results: Recent Labs    11/26/20 1130  WBC 16.1*  HGB 12.5  HCT 39.8  PLT 247   BMET Recent Labs    11/26/20 1035  NA  134*  K 5.0  CL 99  CO2 24  GLUCOSE 127*  BUN 50*  CREATININE 2.38*  CALCIUM 9.6   LFT Recent Labs    11/26/20 1035  PROT 6.8  ALBUMIN 3.6  AST 17  ALT 15  ALKPHOS 87  BILITOT 0.8   PT/INR No results for input(s): LABPROT, INR in the last 72 hours.   Studies/Results: CT Abdomen Pelvis Wo Contrast  Result Date: 11/26/2020 CLINICAL DATA:  Diarrhea.  Generalized weakness.  Colonic stricture. EXAM: CT ABDOMEN AND PELVIS WITHOUT CONTRAST TECHNIQUE: Multidetector CT imaging of the abdomen and pelvis was performed following the standard protocol without IV contrast. COMPARISON:  Virtual colonoscopy 02/15/2020, single-contrast barium enema 05/08/2020. FINDINGS: Lower chest: Mild scarring at the lung bases. Hepatobiliary: No focal hepatic lesion on noncontrast exam. Gallbladder normal Pancreas: Pancreas is normal. No ductal dilatation. No pancreatic inflammation. Spleen: Normal spleen Adrenals/urinary tract: Adrenal glands normal. Nephrolithiasis ureterolithiasis. Bladder normal Stomach/Bowel: Stomach, duodenum and small bowel normal. Terminal ileum is normal. Appendix not identified. There is fluid stool in the ascending colon. There is several large stool balls in the distal transverse colon leading up the splenic flexure. These have a chronic appearance concentric lamellations (image 34/3). There is a caliber change at the splenic flexure leading to collapsed descending colon. This caliber change present on image 20/3 of axial imaging. A stricturing is well seen on image 150/series 7/sagittal imaging. There several large stool ball again retained proximal to this stricturing of the splenic flexure. Several diverticula descending colon.  Fluid within the rectum. Vascular/Lymphatic: Abdominal aorta is normal caliber with atherosclerotic calcification. There is no retroperitoneal or periportal lymphadenopathy. No pelvic lymphadenopathy. Reproductive: Uterus and adnexa unremarkable. Other: No free  fluid or free air. Musculoskeletal: No aggressive osseous lesion. IMPRESSION: 1. 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 Three large chronic lamellated stool balls proximal to this stricturing. Differential includes benign and malignant stricturing of the colon. 2. No lymphadenopathy. Electronically Signed   By: Genevive Bi M.D.   On: 11/26/2020 14:52    Impression: Chronic cecal stricture vs. Malignancy as demonstrated on CT 11/26/2020.  3 chronic stool balls seen on CT proximal to stricturing.  Patient's abdomen is moderately distended on exam today. -Normal Hgb (12.5) -Acute colitis and splenic flexure stricture seen on colonoscopy 05/10/20, though prep was poor  AKI on CKD: BUN 50/ Cr 2.38  Plan: Start MoviPrep, as patient unlikely to tolerate NuLYTELY prep give nausea/vomiting and abdominal pain and distention.  Plan for colonoscopy Wednesday 2/2 after two-day prep.  I thoroughly discussed the procedure with the patient to include nature, alternatives, benefits, and risks (including but not limited to bleeding, infection, perforation/rupture of the colon, anesthesia/cardiac and pulmonary complications).  Patient verbalized understanding gave verbal consent to proceed with colonoscopy.  Clear liquid diet for now.  Antiemetics as needed.  [facility] will follow.   LOS: 0 days   Edrick Kins  PA-C 11/26/2020, 3:42 PM  Contact #  [phone]

## 2020-11-26 NOTE — ED Triage Notes (Signed)
Pt from home for eval of diarrhea since last night. Confused and weak on arrival to ED. Sat on the toilet all night and was unable to get up.

## 2020-11-26 NOTE — ED Provider Notes (Signed)
[facility] EMERGENCY DEPARTMENT Provider Note  CSN: [id number] Arrival date & time: 11/26/20 9244    History Chief Complaint  Patient presents with  . Diarrhea    HPI  Michelle Graves is a 71 y.o. female presents to the ED for re-evaluation of abdominal pain, mostly on the left side. She has had symptoms for at least 2 weeks. She was seen in the ED at [facility] on 1/23 where xray and [place] were done as well as blood work. She had signs of constipation and gall stones. General Surgery was consulted and did not feel her symptoms were due to the gall stones and recommended increased bowel regimen. She was subsequently see in this ED on 1/27 and again had labs that were unremarkable. No additional abdominal imaging was done then. She reports since that visit, she has had several episodes of loose watery stool, denies any blood. She has been increasingly weak at home and having difficulty getting around (she lives alone and typically uses a cane). She has had 2 episodes of vomiting over the last 3 days. No fevers. She has not had much appetite so poor PO intake during that time as well. She had a visit for abdominal pain in Jul 2021, found to have colitis. She has also been admitted for AKI previously as well.    Past Medical History:  Diagnosis Date  . Acute on chronic kidney failure (HCC)   . Anemia   . Arthritis   . Chest pain   . Diverticulitis   . Hypertension   . Stroke (HCC)   . UTI (urinary tract infection) 10/2019    Past Surgical History:  Procedure Laterality Date  . BIOPSY  04/01/2019   Procedure: BIOPSY;  Surgeon: Kathi Der, MD;  Location: [facility] ENDOSCOPY;  Service: Gastroenterology;;  . BIOPSY  10/05/2019   Procedure: BIOPSY;  Surgeon: Kathi Der, MD;  Location: [facility] ENDOSCOPY;  Service: Gastroenterology;;  . BIOPSY  05/10/2020   Procedure: BIOPSY;  Surgeon: Kathi Der, MD;  Location: [facility] ENDOSCOPY;  Service: Gastroenterology;;  . COLONOSCOPY WITH PROPOFOL  N/A 04/01/2019   Procedure: COLONOSCOPY WITH PROPOFOL;  Surgeon: Kathi Der, MD;  Location: [facility] ENDOSCOPY;  Service: Gastroenterology;  Laterality: N/A;  . COLONOSCOPY WITH PROPOFOL N/A 10/05/2019   Procedure: COLONOSCOPY WITH PROPOFOL;  Surgeon: Kathi Der, MD;  Location: [facility] ENDOSCOPY;  Service: Gastroenterology;  Laterality: N/A;  . COLONOSCOPY WITH PROPOFOL N/A 05/10/2020   Procedure: COLONOSCOPY WITH PROPOFOL;  Surgeon: Kathi Der, MD;  Location: [facility] ENDOSCOPY;  Service: Gastroenterology;  Laterality: N/A;  . ESOPHAGOGASTRODUODENOSCOPY (EGD) WITH PROPOFOL N/A 04/01/2019   Procedure: ESOPHAGOGASTRODUODENOSCOPY (EGD) WITH PROPOFOL;  Surgeon: Kathi Der, MD;  Location: [facility] ENDOSCOPY;  Service: Gastroenterology;  Laterality: N/A;  . ESOPHAGOGASTRODUODENOSCOPY (EGD) WITH PROPOFOL N/A 10/05/2019   Procedure: ESOPHAGOGASTRODUODENOSCOPY (EGD) WITH PROPOFOL;  Surgeon: Kathi Der, MD;  Location: [facility] ENDOSCOPY;  Service: Gastroenterology;  Laterality: N/A;  . TONSILLECTOMY      Family History  Problem Relation Age of Onset  . Hypertension Mother   . Diabetes Mother   . CAD Father        died of MI at age 83  . Hypertension Father   . Diabetes Sister   . Diabetes Sister   . Kidney disease Neg Hx     Social History   Tobacco Use  . Smoking status: Never Smoker  . Smokeless tobacco: Never Used  Vaping Use  . Vaping Use: Never used  Substance Use Topics  . Alcohol use:  Yes    Alcohol/week: 21.0 standard drinks    Types: 21 Glasses of wine per week    Comment: sometimes   . Drug use: No     Home Medications Prior to Admission medications   Medication Sig Start Date End Date Taking? Authorizing Provider  alum & mag hydroxide-simeth (MAALOX ADVANCED MAX ST) 400-400-40 MG/5ML suspension Take 15 mLs by mouth every 6 (six) hours as needed for indigestion. 11/22/20   Nira Conn, MD  diltiazem (CARDIZEM CD) 180 MG 24 hr capsule Take 1 capsule (180 mg  total) by mouth daily. 05/11/20   Glade Lloyd, MD  docusate sodium (COLACE) 100 MG capsule Take 1 capsule (100 mg total) by mouth 2 (two) times daily. 05/11/20   Glade Lloyd, MD  doxepin (SINEQUAN) 50 MG capsule Take 10 mg by mouth daily. 05/02/20   [provider]  DULoxetine (CYMBALTA) 60 MG capsule Take 60 mg by mouth at bedtime.     [provider]  hydroxychloroquine (PLAQUENIL) 200 MG tablet Take 200 mg by mouth 2 (two) times daily.  06/16/16   [provider]  lidocaine (XYLOCAINE) 2 % solution Use as directed 15 mLs in the mouth or throat every 6 (six) hours as needed (for severe stomach pain not relieved with Maalox). 11/22/20   Nira Conn, MD  metoprolol tartrate (LOPRESSOR) 25 MG tablet Take 1 tablet (25 mg total) by mouth 2 (two) times daily. 05/11/20   Glade Lloyd, MD  Multiple Vitamin (MULTIVITAMIN WITH MINERALS) TABS tablet Take 1 tablet by mouth daily. 06/03/19   Shahmehdi, Gemma Payor, MD  MYRBETRIQ 25 MG TB24 tablet Take 25 mg by mouth daily. 10/18/19   [provider]  omeprazole (PRILOSEC) 20 MG capsule Take 20 mg by mouth daily as needed (acid reflux).  08/22/19   [provider]  omeprazole (PRILOSEC) 20 MG capsule Take 1 capsule (20 mg total) by mouth daily. 11/22/20   Nira Conn, MD  ondansetron (ZOFRAN) 4 MG tablet Take 1 tablet (4 mg total) by mouth every 6 (six) hours as needed for nausea. Patient taking differently: Take 4 mg by mouth every 6 (six) hours as needed for nausea or vomiting.  04/04/19   Glade Lloyd, MD  polyethylene glycol (MIRALAX / GLYCOLAX) 17 g packet Take 17 g by mouth 2 (two) times daily. 05/11/20   Glade Lloyd, MD  prazosin (MINIPRESS) 1 MG capsule Take 1 mg by mouth at bedtime. 05/02/20   [provider]  predniSONE (DELTASONE) 10 MG tablet Take 5 mg by mouth daily. 05/02/20   [provider]  QUEtiapine (SEROQUEL) 25 MG tablet Take 25 mg by mouth daily. 05/02/20   [provider]  solifenacin (VESICARE) 5 MG tablet Take 5 mg by mouth daily. 05/02/20   [provider]     Allergies    Patient has no known allergies.   Review of Systems   Review of Systems A comprehensive review of systems was completed and negative except as noted in HPI.    Physical Exam BP 134/84   Pulse 69   Temp 97.6 F (36.4 C) (Oral)   Resp 16   Ht 5\' 7"  (1.702 m)   Wt 87.1 kg   SpO2 99%   BMI 30.07 kg/m   Physical Exam Vitals and nursing note reviewed.  Constitutional:      Appearance: Normal appearance.  HENT:     Head: Normocephalic and atraumatic.     Nose: Nose normal.  Mouth/Throat:     Mouth: Mucous membranes are moist.  Eyes:     Extraocular Movements: Extraocular movements intact.     Conjunctiva/sclera: Conjunctivae normal.  Cardiovascular:     Rate and Rhythm: Normal rate.  Pulmonary:     Effort: Pulmonary effort is normal.     Breath sounds: Normal breath sounds.  Abdominal:     General: Abdomen is flat.     Palpations: Abdomen is soft.     Tenderness: There is abdominal tenderness (diffuse, mostly left sided). There is no guarding.  Musculoskeletal:        General: No swelling. Normal range of motion.     Cervical back: Neck supple.  Skin:    General: Skin is warm and dry.  Neurological:     General: No focal deficit present.     Mental Status: She is alert.  Psychiatric:        Mood and Affect: Mood normal.      ED Results / Procedures / Treatments   Labs (all labs ordered are listed, but only abnormal results are displayed) Labs Reviewed  COMPREHENSIVE METABOLIC PANEL - Abnormal; Notable for the following components:      Result Value   Sodium 134 (*)    Glucose, Bld 127 (*)    BUN 50 (*)    Creatinine, Ser 2.38 (*)    GFR, Estimated 22 (*)    All other components within normal limits  CBC - Abnormal; Notable for the following components:   WBC 16.1 (*)    All other components within normal limits  SARS  CORONAVIRUS 2 BY RT PCR (HOSPITAL ORDER, PERFORMED IN [facility])  LIPASE, BLOOD  URINALYSIS, ROUTINE W REFLEX MICROSCOPIC    EKG None  Radiology CT Abdomen Pelvis Wo Contrast  Result Date: 11/26/2020 CLINICAL DATA:  Diarrhea.  Generalized weakness.  Colonic stricture. EXAM: CT ABDOMEN AND PELVIS WITHOUT CONTRAST TECHNIQUE: Multidetector CT imaging of the abdomen and pelvis was performed following the standard protocol without IV contrast. COMPARISON:  Virtual colonoscopy 02/15/2020, single-contrast barium enema 05/08/2020. FINDINGS: Lower chest: Mild scarring at the lung bases. Hepatobiliary: No focal hepatic lesion on noncontrast exam. Gallbladder normal Pancreas: Pancreas is normal. No ductal dilatation. No pancreatic inflammation. Spleen: Normal spleen Adrenals/urinary tract: Adrenal glands normal. Nephrolithiasis ureterolithiasis. Bladder normal Stomach/Bowel: Stomach, duodenum and small bowel normal. Terminal ileum is normal. Appendix not identified. There is fluid stool in the ascending colon. There is several large stool balls in the distal transverse colon leading up the splenic flexure. These have a chronic appearance concentric lamellations (image 34/3). There is a caliber change at the splenic flexure leading to collapsed descending colon. This caliber change present on image 20/3 of axial imaging. A stricturing is well seen on image 150/series 7/sagittal imaging. There several large stool ball again retained proximal to this stricturing of the splenic flexure. Several diverticula descending colon.  Fluid within the rectum. Vascular/Lymphatic: Abdominal aorta is normal caliber with atherosclerotic calcification. There is no retroperitoneal or periportal lymphadenopathy. No pelvic lymphadenopathy. Reproductive: Uterus and adnexa unremarkable. Other: No free fluid or free air. Musculoskeletal: No aggressive osseous lesion. IMPRESSION: 1. 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 Three large chronic lamellated stool balls proximal to this stricturing. Differential includes benign and malignant stricturing of the colon. 2. No lymphadenopathy. Electronically Signed   By: Genevive Bi M.D.   On: 11/26/2020 14:52    Procedures Procedures  Medications Ordered in the ED Medications  sodium chloride 0.9 % bolus 1,000 mL (1,000 mLs Intravenous New Bag/Given 11/26/20 1137)  iohexol (OMNIPAQUE) 9 MG/ML oral solution 500 mL (500 mLs Oral Contrast Given 11/26/20 1157)     MDM Rules/Calculators/A&P MDM Patient with general weakness and poor PO intake in setting of ongoing abdominal pain/constipation and now reported diarrhea. Will check labs and give fluids. Abdomen is benign at this time.  ED Course  I have reviewed the triage vital signs and the nursing notes.  Pertinent labs & imaging results that were available during my care of the patient were reviewed by me and considered in my medical decision making (see chart for details).  Clinical Course as of 11/26/20 1607  Mon Nov 26, 2020  1139 CMP with new AKI. Will continue with IVF for presumed pre-renal cause. Send for CT without contrast to evaluate signs of bowel obstruction or IBD.  [CS]  1204 CBC with elevated WBC [CS]  1530 CT images reviewed, will plan admission for AKI and further eval of her colonic stricture.  [CS]  1538 Spoke with Dr. Bosie Clos, GI who will consult.  [CS]  1606 Spoke with Dr. Chipper Herb, Hospitalist, who will evaluate for admission.  [CS]    Clinical Course User Index [CS] Pollyann Savoy, MD    Final Clinical Impression(s) / ED Diagnoses Final diagnoses:  Colonic stricture (HCC)  AKI (acute kidney injury) [facility])    Rx / DC Orders ED Discharge Orders    None       Pollyann Savoy, MD 11/26/20 [phone]

## 2020-11-26 NOTE — ED Notes (Signed)
IV team at bedside 

## 2020-11-26 NOTE — H&P (Signed)
History and Physical    ANAID HANEY ZOX:[id number] DOB: 1951-05-17 DOA: 11/26/2020  PCP: [facility] (Confirm with patient/family/NH records and if not entered, this has to be entered at [facility] point of entry) Patient coming from: Home  I have personally briefly reviewed patient's old medical records in [facility]  Chief Complaint: Diarrhea  HPI: Michelle Graves is a 70 y.o. female with medical history significant of rheumatoid arthritis on disease modification medications, CKD stage IIIb, HTN, diverticulitis, colitis with sequelae colonic stricture, presented with abdominal pain and diarrhea.  Patient was hospitalized July 2021 for episodes of colon obstruction, CT showed nonobstructive stenosis and splenic flexure.  During that admission, colonoscopy showed moderate to severe colitis in the transverse colon along with evidence of nonobstructive stenosis at the splenic flexure.  Patient has been having poorly controlled multiple arthritis lately and her PCP started her as needed morphine and oxycodone.  She developed severe abdominal pain and nauseous vomit and went to [facility] on 11/18/2020, work-up found a 1.1 cm mobile gallstone near the neck of gallbladder, no signs of acute cholecystitis, and constipation as well as ileus, patient was given enema in the ED and and patient was released.  ED physician did tell patient to stop using narcotics.  However it appears that patient continue to use the oxycodone for her worsening arthritis pain last week.  This time, patient came in with >1 week of constipation, and 1 day of cramping like abdominal pain and and large watery diarrhea this morning.  Denies any fever or chills.  Patient describes the pain as the same cramping like on the left aspect of abdomen, gradually getting worse, associated with nausea but no vomiting. 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  Three large chronic lamellated stool balls proximal to this stricturing.  GI was consulted.  Review of Systems: As per HPI otherwise 14 point review of systems negative.    Past Medical History:  Diagnosis Date  . Acute on chronic kidney failure (HCC)   . Anemia   . Arthritis   . Chest pain   . Diverticulitis   . Hypertension   . Stroke (HCC)   . UTI (urinary tract infection) 10/2019    Past Surgical History:  Procedure Laterality Date  . BIOPSY  04/01/2019   Procedure: BIOPSY;  Surgeon: Kathi Der, MD;  Location: [facility] ENDOSCOPY;  Service: Gastroenterology;;  . BIOPSY  10/05/2019   Procedure: BIOPSY;  Surgeon: Kathi Der, MD;  Location: [facility] ENDOSCOPY;  Service: Gastroenterology;;  . BIOPSY  05/10/2020   Procedure: BIOPSY;  Surgeon: Kathi Der, MD;  Location: [facility] ENDOSCOPY;  Service: Gastroenterology;;  . COLONOSCOPY WITH PROPOFOL N/A 04/01/2019   Procedure: COLONOSCOPY WITH PROPOFOL;  Surgeon: Kathi Der, MD;  Location: [facility] ENDOSCOPY;  Service: Gastroenterology;  Laterality: N/A;  . COLONOSCOPY WITH PROPOFOL N/A 10/05/2019   Procedure: COLONOSCOPY WITH PROPOFOL;  Surgeon: Kathi Der, MD;  Location: [facility] ENDOSCOPY;  Service: Gastroenterology;  Laterality: N/A;  . COLONOSCOPY WITH PROPOFOL N/A 05/10/2020   Procedure: COLONOSCOPY WITH PROPOFOL;  Surgeon: Kathi Der, MD;  Location: [facility] ENDOSCOPY;  Service: Gastroenterology;  Laterality: N/A;  . ESOPHAGOGASTRODUODENOSCOPY (EGD) WITH PROPOFOL N/A 04/01/2019   Procedure: ESOPHAGOGASTRODUODENOSCOPY (EGD) WITH PROPOFOL;  Surgeon: Kathi Der, MD;  Location: [facility] ENDOSCOPY;  Service: Gastroenterology;  Laterality: N/A;  . ESOPHAGOGASTRODUODENOSCOPY (EGD) WITH PROPOFOL N/A 10/05/2019   Procedure: ESOPHAGOGASTRODUODENOSCOPY (EGD) WITH PROPOFOL;  Surgeon: Kathi Der, MD;  Location: [facility]  ENDOSCOPY;  Service: Gastroenterology;  Laterality: N/A;  . TONSILLECTOMY       reports that she has never smoked. She has  never used smokeless tobacco. She reports current alcohol use of about 21.0 standard drinks of alcohol per week. She reports that she does not use drugs.  No Known Allergies  Family History  Problem Relation Age of Onset  . Hypertension Mother   . Diabetes Mother   . CAD Father        died of MI at age 56  . Hypertension Father   . Diabetes Sister   . Diabetes Sister   . Kidney disease Neg Hx      Prior to Admission medications   Medication Sig Start Date End Date Taking? Authorizing Provider  alum & mag hydroxide-simeth (MAALOX ADVANCED MAX ST) 400-400-40 MG/5ML suspension Take 15 mLs by mouth every 6 (six) hours as needed for indigestion. 11/22/20   Nira Conn, MD  diltiazem (CARDIZEM CD) 180 MG 24 hr capsule Take 1 capsule (180 mg total) by mouth daily. 05/11/20   Glade Lloyd, MD  docusate sodium (COLACE) 100 MG capsule Take 1 capsule (100 mg total) by mouth 2 (two) times daily. 05/11/20   Glade Lloyd, MD  doxepin (SINEQUAN) 50 MG capsule Take 10 mg by mouth daily. 05/02/20   [provider]  DULoxetine (CYMBALTA) 60 MG capsule Take 60 mg by mouth at bedtime.     [provider]  hydroxychloroquine (PLAQUENIL) 200 MG tablet Take 200 mg by mouth 2 (two) times daily.  06/16/16   [provider]  lidocaine (XYLOCAINE) 2 % solution Use as directed 15 mLs in the mouth or throat every 6 (six) hours as needed (for severe stomach pain not relieved with Maalox). 11/22/20   Nira Conn, MD  metoprolol tartrate (LOPRESSOR) 25 MG tablet Take 1 tablet (25 mg total) by mouth 2 (two) times daily. 05/11/20   Glade Lloyd, MD  Multiple Vitamin (MULTIVITAMIN WITH MINERALS) TABS tablet Take 1 tablet by mouth daily. 06/03/19   Shahmehdi, Gemma Payor, MD  MYRBETRIQ 25 MG TB24 tablet Take 25 mg by mouth daily. 10/18/19   [provider]  omeprazole (PRILOSEC) 20 MG capsule Take 20 mg by mouth daily as needed (acid reflux).  08/22/19   [provider]  omeprazole (PRILOSEC) 20 MG capsule Take 1 capsule (20 mg total) by mouth daily. 11/22/20   Nira Conn, MD  ondansetron (ZOFRAN) 4 MG tablet Take 1 tablet (4 mg total) by mouth every 6 (six) hours as needed for nausea. Patient taking differently: Take 4 mg by mouth every 6 (six) hours as needed for nausea or vomiting.  04/04/19   Glade Lloyd, MD  polyethylene glycol (MIRALAX / GLYCOLAX) 17 g packet Take 17 g by mouth 2 (two) times daily. 05/11/20   Glade Lloyd, MD  prazosin (MINIPRESS) 1 MG capsule Take 1 mg by mouth at bedtime. 05/02/20   [provider]  predniSONE (DELTASONE) 10 MG tablet Take 5 mg by mouth daily. 05/02/20   [provider]  QUEtiapine (SEROQUEL) 25 MG tablet Take 25 mg by mouth daily. 05/02/20   [provider]  solifenacin (VESICARE) 5 MG tablet Take 5 mg by mouth daily. 05/02/20   [provider]    Physical Exam: Vitals:   11/26/20 1300 11/26/20 1315 11/26/20 1400 11/26/20 1545  BP: 127/65 (!) 141/94 116/65 134/84  Pulse: 70 71 70 69  Resp: 12 14 15  16  Temp:      TempSrc:      SpO2: 96% 98% 100% 99%  Weight:      Height:        Constitutional: NAD, calm, comfortable Vitals:   11/26/20 1300 11/26/20 1315 11/26/20 1400 11/26/20 1545  BP: 127/65 (!) 141/94 116/65 134/84  Pulse: 70 71 70 69  Resp: 12 14 15 16   Temp:      TempSrc:      SpO2: 96% 98% 100% 99%  Weight:      Height:       Eyes: PERRL, lids and conjunctivae normal ENMT: Mucous membranes are dry. Posterior pharynx clear of any exudate or lesions.Normal dentition.  Neck: normal, supple, no masses, no thyromegaly Respiratory: clear to auscultation bilaterally, no wheezing, no crackles. Normal respiratory effort. No accessory muscle use.  Cardiovascular: Regular rate and rhythm, no murmurs / rubs / gallops. No extremity edema. 2+ pedal pulses. No carotid bruits.  Abdomen: Positive for tenderness on left aspect of the abdomen, no rebound no  guarding, no masses palpated. No hepatosplenomegaly. Bowel sounds positive.  Musculoskeletal: no clubbing / cyanosis.  Small joints deformities of multiple fingers, Normal muscle tone.  Skin: no rashes, lesions, ulcers. No induration Neurologic: CN 2-12 grossly intact. Sensation intact, DTR normal. Strength 5/5 in all 4.  Psychiatric: Normal judgment and insight. Alert and oriented x 3. Normal mood.     Labs on Admission: I have personally reviewed following labs and imaging studies  CBC: Recent Labs  Lab 11/22/20 0140 11/26/20 1130  WBC 9.7 16.1*  HGB 11.4* 12.5  HCT 34.2* 39.8  MCV 95.5 97.8  PLT 229 247   Basic Metabolic Panel: Recent Labs  Lab 11/22/20 0140 11/26/20 1035  NA 138 134*  K 4.7 5.0  CL 103 99  CO2 25 24  GLUCOSE 150* 127*  BUN 42* 50*  CREATININE 1.86* 2.38*  CALCIUM 9.0 9.6   GFR: Estimated Creatinine Clearance: 26 mL/min (A) (by C-G formula based on SCr of 2.38 mg/dL (H)). Liver Function Tests: Recent Labs  Lab 11/22/20 0347 11/26/20 1035  AST 17 17  ALT 16 15  ALKPHOS 94 87  BILITOT 0.3 0.8  PROT 7.2 6.8  ALBUMIN 3.7 3.6   Recent Labs  Lab 11/22/20 0347 11/26/20 1035  LIPASE 23 25   No results for input(s): AMMONIA in the last 168 hours. Coagulation Profile: No results for input(s): INR, PROTIME in the last 168 hours. Cardiac Enzymes: No results for input(s): CKTOTAL, CKMB, CKMBINDEX, TROPONINI in the last 168 hours. BNP (last 3 results) No results for input(s): PROBNP in the last 8760 hours. HbA1C: No results for input(s): HGBA1C in the last 72 hours. CBG: No results for input(s): GLUCAP in the last 168 hours. Lipid Profile: No results for input(s): CHOL, HDL, LDLCALC, TRIG, CHOLHDL, LDLDIRECT in the last 72 hours. Thyroid Function Tests: No results for input(s): TSH, T4TOTAL, FREET4, T3FREE, THYROIDAB in the last 72 hours. Anemia Panel: No results for input(s): VITAMINB12, FOLATE, FERRITIN, TIBC, IRON, RETICCTPCT in the last  72 hours. Urine analysis:    Component Value Date/Time   COLORURINE YELLOW 05/06/2020 1148   APPEARANCEUR HAZY (A) 05/06/2020 1148   LABSPEC >1.030 (H) 05/06/2020 1148   PHURINE 5.5 05/06/2020 1148   GLUCOSEU NEGATIVE 05/06/2020 1148   HGBUR NEGATIVE 05/06/2020 1148   BILIRUBINUR SMALL (A) 05/06/2020 1148   KETONESUR NEGATIVE 05/06/2020 1148   PROTEINUR NEGATIVE 05/06/2020 1148   UROBILINOGEN 1.0 06/24/2010 1850   NITRITE NEGATIVE  05/06/2020 1148   LEUKOCYTESUR NEGATIVE 05/06/2020 1148    Radiological Exams on Admission: CT Abdomen Pelvis Wo Contrast  Result Date: 11/26/2020 CLINICAL DATA:  Diarrhea.  Generalized weakness.  Colonic stricture. EXAM: CT ABDOMEN AND PELVIS WITHOUT CONTRAST TECHNIQUE: Multidetector CT imaging of the abdomen and pelvis was performed following the standard protocol without IV contrast. COMPARISON:  Virtual colonoscopy 02/15/2020, single-contrast barium enema 05/08/2020. FINDINGS: Lower chest: Mild scarring at the lung bases. Hepatobiliary: No focal hepatic lesion on noncontrast exam. Gallbladder normal Pancreas: Pancreas is normal. No ductal dilatation. No pancreatic inflammation. Spleen: Normal spleen Adrenals/urinary tract: Adrenal glands normal. Nephrolithiasis ureterolithiasis. Bladder normal Stomach/Bowel: Stomach, duodenum and small bowel normal. Terminal ileum is normal. Appendix not identified. There is fluid stool in the ascending colon. There is several large stool balls in the distal transverse colon leading up the splenic flexure. These have a chronic appearance concentric lamellations (image 34/3). There is a caliber change at the splenic flexure leading to collapsed descending colon. This caliber change present on image 20/3 of axial imaging. A stricturing is well seen on image 150/series 7/sagittal imaging. There several large stool ball again retained proximal to this stricturing of the splenic flexure. Several diverticula descending colon.  Fluid  within the rectum. Vascular/Lymphatic: Abdominal aorta is normal caliber with atherosclerotic calcification. There is no retroperitoneal or periportal lymphadenopathy. No pelvic lymphadenopathy. Reproductive: Uterus and adnexa unremarkable. Other: No free fluid or free air. Musculoskeletal: No aggressive osseous lesion. IMPRESSION: 1. 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 Three large chronic lamellated stool balls proximal to this stricturing. Differential includes benign and malignant stricturing of the colon. 2. No lymphadenopathy. Electronically Signed   By: Genevive Bi M.D.   On: 11/26/2020 14:52    EKG: Ordered  Assessment/Plan Active Problems:   AKI (acute kidney injury) (HCC)   Colon stricture (HCC)  (please populate well all problems here in Problem List. (For example, if patient is on BP meds at home and you resume or decide to hold them, it is a problem that needs to be her. Same for CAD, COPD, HLD and so on)  AKI on CKD stage IIIB -Signs of hypovolemia likely from poor oral intake and diarrhea -IV hydration and reevaluate  Severe obstipation with paradoxical diarrhea -Likely from baseline transverse colon stricture which made worse with narcotic use. -Discussed with patient regarding proper pain meds use, however patient claimed that she has severe rheumatoid arthritis not controlled with current disease modification regimen.  I strongly recommend her to go talk to her rheumatologist about switching to biological agent. -Stricture at the transverse colon was considered related to sequelae of colitis, no Hx of Crohns. -Liquid diet for now and GI plans for colonoscopy on Wednesday.  Leukocytosis -No significant source of infection, will monitor off antibiotics for now.  Severe rheumatoid arthritis -As above  HTN -Continue home BP regimen   DVT prophylaxis: Subcu Heparin code Status: Full Code Family Communication: Daughter [name] on  phone Disposition Plan: Will need more than 2 midnight hospital stay Consults called: [facility] Admission status: MedSurg   Emeline General MD [facility] Pager [phone]  11/26/2020, 5:12 PM

## 2020-11-26 NOTE — ED Notes (Signed)
This RN unsuccessful at obtaining IV access, IV team consult place.

## 2020-11-26 NOTE — ED Notes (Signed)
Phlebotomy unable to obtain blood in triage

## 2020-11-26 NOTE — ED Notes (Signed)
Pt transported to CT ?

## 2020-11-26 NOTE — ED Notes (Signed)
When you get a chance with the patient permission please call the daug.  [name] [phone]

## 2020-11-27 ENCOUNTER — Encounter (HOSPITAL_COMMUNITY): Payer: Self-pay | Admitting: Internal Medicine

## 2020-11-27 LAB — URINALYSIS, ROUTINE W REFLEX MICROSCOPIC
Bilirubin Urine: NEGATIVE
Glucose, UA: NEGATIVE mg/dL
Ketones, ur: NEGATIVE mg/dL
Leukocytes,Ua: NEGATIVE
Nitrite: NEGATIVE
Protein, ur: NEGATIVE mg/dL
RBC / HPF: >50 RBC/hpf — ABNORMAL HIGH (ref 0–5)
Specific Gravity, Urine: 1.015 (ref 1.005–1.030)
pH: 8 (ref 5.0–8.0)

## 2020-11-27 LAB — COMPREHENSIVE METABOLIC PANEL
ALT: 15 U/L (ref 0–44)
AST: 15 U/L (ref 15–41)
Albumin: 3.4 g/dL — ABNORMAL LOW (ref 3.5–5.0)
Alkaline Phosphatase: 96 U/L (ref 38–126)
Anion gap: 10 (ref 5–15)
BUN: 45 mg/dL — ABNORMAL HIGH (ref 8–23)
CO2: 26 mmol/L (ref 22–32)
Calcium: 8.6 mg/dL — ABNORMAL LOW (ref 8.9–10.3)
Chloride: 99 mmol/L (ref 98–111)
Creatinine, Ser: 2.35 mg/dL — ABNORMAL HIGH (ref 0.44–1.00)
GFR, Estimated: 22 mL/min — ABNORMAL LOW (ref >60)
Glucose, Bld: 90 mg/dL (ref 70–99)
Potassium: 4.8 mmol/L (ref 3.5–5.1)
Sodium: 135 mmol/L (ref 135–145)
Total Bilirubin: 0.9 mg/dL (ref 0.3–1.2)
Total Protein: 6.6 g/dL (ref 6.5–8.1)

## 2020-11-27 MED ORDER — SODIUM CHLORIDE 0.9 % IV SOLN: INTRAVENOUS | Status: DC

## 2020-11-27 MED ORDER — SODIUM CHLORIDE 0.9 % IV SOLN: INTRAVENOUS | Status: AC

## 2020-11-27 NOTE — Evaluation (Signed)
Physical Therapy Evaluation Patient Details Name: Michelle Graves MRN: [id number] DOB: Jan 08, 1951 Today's Date: 11/27/2020   History of Present Illness  Pt is a 70 y/o female admitted secondary to  1 week history of constipation and abdominal pain and large watery diarrhea.  CT abdomen And pelvis showed stricture of the transverse colon at the splenic flexure. PMH includes RA, CKD, and HTN.  Clinical Impression  Pt admitted secondary to problem above with deficits below. Pt requiring min guard to stand at EOB. Pt incontinent of stool upon standing, so unable to perform further mobility tasks. Anticipate pt will progress well once symptoms improve and be able to d/c home with HHPT. Will continue to follow acutely.     Follow Up Recommendations Home health PT;Supervision for mobility/OOB    Equipment Recommendations  None recommended by PT    Recommendations for Other Services       Precautions / Restrictions Precautions Precautions: Fall Restrictions Weight Bearing Restrictions: No      Mobility  Bed Mobility Overal bed mobility: Needs Assistance Bed Mobility: Supine to Sit;Sit to Supine     Supine to sit: Supervision Sit to supine: Supervision   General bed mobility comments: Supervision for safety. Increased time required.    Transfers Overall transfer level: Needs assistance Equipment used: Rolling walker (2 wheeled) Transfers: Sit to/from Stand Sit to Stand: Min guard         General transfer comment: Min guard for safety. Used momentum to stand. Pt incontinent of stool, so unable to attempt further mobility.  Ambulation/Gait                Stairs            Wheelchair Mobility    Modified Rankin (Stroke Patients Only)       Balance Overall balance assessment: Needs assistance Sitting-balance support: No upper extremity supported;Feet supported Sitting balance-[name]: Good     Standing balance support: Bilateral upper extremity  supported;During functional activity Standing balance-[name]: Poor Standing balance comment: Reliant on BUE support                             Pertinent Vitals/Pain Pain Assessment: Faces Faces Pain Scale: Hurts a little bit Pain Location: abdomen Pain Descriptors / Indicators: Grimacing;Guarding Pain Intervention(s): Limited activity within patient's tolerance;Monitored during session;Repositioned    Home Living Family/patient expects to be discharged to:: Private residence Living Arrangements: Alone Available Help at Discharge: Family;Available PRN/intermittently Type of Home: Apartment Home Access: Elevator     Home Layout: One level Home Equipment: Walker - 2 wheels;Walker - 4 wheels;Cane - quad;Shower seat;Bedside commode;Wheelchair - manual      Prior Function Level of Independence: Independent with assistive device(s)         Comments: Uses RW or rollator  for ambulation.     Hand Dominance        Extremity/Trunk Assessment   Upper Extremity Assessment Upper Extremity Assessment: Defer to OT evaluation    Lower Extremity Assessment Lower Extremity Assessment: Generalized weakness    Cervical / Trunk Assessment Cervical / Trunk Assessment: Normal  Communication   Communication: No difficulties  Cognition Arousal/Alertness: Awake/alert Behavior During Therapy: WFL for tasks assessed/performed Overall Cognitive Status: No family/caregiver present to determine baseline cognitive functioning  General Comments: Questionable memory deficits      General Comments      Exercises     Assessment/Plan    PT Assessment Patient needs continued PT services  PT Problem List Decreased strength;Decreased mobility;Decreased balance;Decreased activity tolerance;Decreased safety awareness       PT Treatment Interventions Gait training;DME instruction;Functional mobility training;Therapeutic  activities;Therapeutic exercise;Balance training;Patient/family education    PT Goals (Current goals can be found in the Care Plan section)  Acute Rehab PT Goals Patient Stated Goal: to feel better PT Goal Formulation: With patient Time For Goal Achievement: 12/11/20 Potential to Achieve Goals: Good    Frequency Min 3X/week   Barriers to discharge        Co-evaluation               AM-PAC PT "6 Clicks" Mobility  Outcome Measure Help needed turning from your back to your side while in a flat bed without using bedrails?: None Help needed moving from lying on your back to sitting on the side of a flat bed without using bedrails?: A Little Help needed moving to and from a bed to a chair (including a wheelchair)?: A Little Help needed standing up from a chair using your arms (e.g., wheelchair or bedside chair)?: A Little Help needed to walk in hospital room?: A Little Help needed climbing 3-5 steps with a railing? : A Lot 6 Click Score: 18    End of Session Equipment Utilized During Treatment: Gait belt Activity Tolerance: Other (comment) (limited secondary to stool incontinence) Patient left: in bed;with call bell/phone within reach;with bed alarm set Nurse Communication: Mobility status PT Visit Diagnosis: Unsteadiness on feet (R26.81);Muscle weakness (generalized) (M62.81)    Time: 8315-1761 PT Time Calculation (min) (ACUTE ONLY): 23 min   Charges:   PT Evaluation $PT Eval Moderate Complexity: 1 Mod PT Treatments $Therapeutic Activity: 8-22 mins        Cindee Salt, DPT  Acute Rehabilitation Services  Pager: [phone] Office: [phone]   Lehman Prom 11/27/2020, 3:42 PM

## 2020-11-27 NOTE — Progress Notes (Addendum)
PROGRESS NOTE    Michelle Graves  EOF:[id number] DOB: 1951-05-29 DOA: 11/26/2020 PCP: [facility]   Brief Narrative: 70 year old with past medical history significant for rheumatoid arthritis on disease modification medication, CKD stage IIIb, hypertension, high diverticulitis, colitis with sequela colonic stricture presented with abdominal pain and diarrhea.  Patient was hospitalized last July 2021 for episode of colonic obstruction, CT showed nonobstructive stenosis of the splenic flexure.  During that admission, colonoscopy showed moderate to severe colitis in the transverse colon along with evidence of nonobstructive stenosis at the splenic flexure. Patient has been taking oxycodone and morphine for poorly controlled arthritis pain.  Patient developed severe abdominal pain and vomiting and went to [facility] on 11/18/2020 she was treated with an enema and released home.  She presented with 1 week history of constipation and abdominal pain and large watery diarrhea.  CT abdomen And pelvis showed stricture of the transverse colon at the splenic flexure.  3 large chronic lamellated stool balls proximal to distal stricturing.  Differential includes benign and malignant stricturing of the colon.   Patient was evaluated by [facility], plan is for colonoscopy 11/28/2020.      Assessment & Plan:   Active Problems:   AKI (acute kidney injury) ([place])   Colonic stricture ([facility])   1-Acute on chronic CKD stage III b; Prior creatinine baseline: 1.3--1.8 Patient presented with a creatinine at 2.3 Acute on chronic renal failure likely secondary to hypovolemia, poor oral intake dehydration. Continue with IV fluids.  2-Abdominal pain, paradoxical diarrhea, colonic stricture; -CT: showed stricture of the transverse colon at the splenic flexure.  3 large chronic lamellated stool balls proximal to distal stricturing.   Differential includes benign and malignant  stricturing of the colon.  -She will need 2 days prep for colonoscopy. -Plan for colonoscopy tomorrow/ 11/2020  3-Rheumatoid arthritis: -Continue with Plaquenil and prednisone  4-Hypertension: Continue with metoprolol, cardizem, Prazosin.Marland Kitchen   5-Leukocytosis;  -Awaiting labs for today.  -UA negative for leukocytes.  -Afebrile, follow trend.   6-Mood disorder: Continue with Cymbalta, Sinequan, Seroquel.   Estimated body mass index is 30.07 kg/m as calculated from the following:   Height as of this encounter: _0  (1.702 m).   Weight as of this encounter: 87.1 kg.   DVT prophylaxis: Heparin  Code Status: Full code Family Communication: care discussed with patient , daughter over phone Disposition Plan:  Status is: Inpatient  Remains inpatient appropriate because:IV treatments appropriate due to intensity of illness or inability to take PO   Dispo: The patient is from: Home              Anticipated d/c is to: Home              Anticipated d/c date is: 2 days              Patient currently is not medically stable to d/c.   Difficult to place patient No        Consultants:   [facility]   Procedures:   None  Antimicrobials:  None  Subjective: She denies abdominal pain, she had 2 BM last night.  Report poor oral intake.   Objective: Vitals:   11/27/20 0100 11/27/20 0130 11/27/20 0200 11/27/20 0400  BP: 136/90 (!) 146/92 (!) 140/115 (!) 170/95  Pulse: 71 76 76 77  Resp: _1 Temp:    98.7 F (37.1 C)  TempSrc:    Oral  SpO2: 99%  97% 100% 100%  Weight:    87.1 kg  Height:    _0  (1.702 m)    Intake/Output Summary (Last 24 hours) at 11/27/2020 0755 Last data filed at 11/27/2020 0400 Gross per 24 hour  Intake 868.04 ml  Output --  Net 868.04 ml   Filed Weights   11/26/20 0911 11/27/20 0400  Weight: 87.1 kg 87.1 kg    Examination:  General exam: Appears calm and comfortable  Respiratory system: Clear to auscultation. Respiratory effort  normal. Cardiovascular system: S1 & S2 heard, RRR. No JVD, murmurs, rubs, gallops or clicks. No pedal edema. Gastrointestinal system: Abdomen is nondistended, soft and nontender. No organomegaly or masses felt. Normal bowel sounds heard. Central nervous system: Alert and oriented. Extremities: Symmetric 5 x 5 power.   Data Reviewed: I have personally reviewed following labs and imaging studies  CBC: Recent Labs  Lab 11/22/20 0140 11/26/20 1130  WBC 9.7 16.1*  HGB 11.4* 12.5  HCT 34.2* 39.8  MCV 95.5 97.8  PLT 229 096   Basic Metabolic Panel: Recent Labs  Lab 11/22/20 0140 11/26/20 1035 11/27/20 0230  NA 138 134* 135  K 4.7 5.0 4.8  CL 103 99 99  CO2 _1 GLUCOSE 150* 127* 90  BUN 42* 50* 45*  CREATININE 1.86* 2.38* 2.35*  CALCIUM 9.0 9.6 8.6*   GFR: Estimated Creatinine Clearance: 26 mL/min (A) (by C-G formula based on SCr of 2.35 mg/dL (H)). Liver Function Tests: Recent Labs  Lab 11/22/20 0347 11/26/20 1035 11/27/20 0230  AST _2 ALT _3 ALKPHOS 94 87 96  BILITOT 0.3 0.8 0.9  PROT 7.2 6.8 6.6  ALBUMIN 3.7 3.6 3.4*   Recent Labs  Lab 11/22/20 0347 11/26/20 1035  LIPASE 23 25   No results for input(s): AMMONIA in the last 168 hours. Coagulation Profile: No results for input(s): INR, PROTIME in the last 168 hours. Cardiac Enzymes: No results for input(s): CKTOTAL, CKMB, CKMBINDEX, TROPONINI in the last 168 hours. BNP (last 3 results) No results for input(s): PROBNP in the last 8760 hours. HbA1C: No results for input(s): HGBA1C in the last 72 hours. CBG: No results for input(s): GLUCAP in the last 168 hours. Lipid Profile: No results for input(s): CHOL, HDL, LDLCALC, TRIG, CHOLHDL, LDLDIRECT in the last 72 hours. Thyroid Function Tests: No results for input(s): TSH, T4TOTAL, FREET4, T3FREE, THYROIDAB in the last 72 hours. Anemia Panel: No results for input(s): VITAMINB12, FOLATE, FERRITIN, TIBC, IRON, RETICCTPCT in the last 72  hours. Sepsis Labs: No results for input(s): PROCALCITON, LATICACIDVEN in the last 168 hours.  Recent Results (from the past 240 hour(s))  SARS Coronavirus 2 by RT PCR (hospital order, performed in [facility] hospital lab) Nasopharyngeal Nasopharyngeal Swab     Status: None   Collection Time: 11/26/20 10:27 PM   Specimen: Nasopharyngeal Swab  Result Value Ref Range Status   SARS Coronavirus 2 NEGATIVE NEGATIVE Final    Comment: (NOTE) SARS-CoV-2 target nucleic acids are NOT DETECTED.  The SARS-CoV-2 RNA is generally detectable in upper and lower respiratory specimens during the acute phase of infection. The lowest concentration of SARS-CoV-2 viral copies this assay can detect is 250 copies / mL. A negative result does not preclude SARS-CoV-2 infection and should not be used as the sole basis for treatment or other patient management decisions.  A negative result may occur with improper specimen collection / handling, submission of specimen other than nasopharyngeal swab, presence of viral  mutation(s) within the areas targeted by this assay, and inadequate number of viral copies (<250 copies / mL). A negative result must be combined with clinical observations, patient history, and epidemiological information.  Fact Sheet for Patients:   StrictlyIdeas.no  Fact Sheet for Healthcare Providers: BankingDealers.co.za  This test is not yet approved or  cleared by the [place] FDA and has been authorized for detection and/or diagnosis of SARS-CoV-2 by FDA under an Emergency Use Authorization (EUA).  This EUA will remain in effect (meaning this test can be used) for the duration of the COVID-19 declaration under Section 564(b)(1) of the Act, 21 U.S.C. section 360bbb-3(b)(1), unless the authorization is terminated or revoked sooner.  Performed at [facility], [place] [address] [place], [place] [address]          Radiology  Studies: CT Abdomen Pelvis Wo Contrast  Result Date: 11/26/2020 CLINICAL DATA:  Diarrhea.  Generalized weakness.  Colonic stricture. EXAM: CT ABDOMEN AND PELVIS WITHOUT CONTRAST TECHNIQUE: Multidetector CT imaging of the abdomen and pelvis was performed following the standard protocol without IV contrast. COMPARISON:  Virtual colonoscopy 02/15/2020, single-contrast barium enema 05/08/2020. FINDINGS: Lower chest: Mild scarring at the lung bases. Hepatobiliary: No focal hepatic lesion on noncontrast exam. Gallbladder normal Pancreas: Pancreas is normal. No ductal dilatation. No pancreatic inflammation. Spleen: Normal spleen Adrenals/urinary tract: Adrenal glands normal. Nephrolithiasis ureterolithiasis. Bladder normal Stomach/Bowel: Stomach, duodenum and small bowel normal. Terminal ileum is normal. Appendix not identified. There is fluid stool in the ascending colon. There is several large stool balls in the distal transverse colon leading up the splenic flexure. These have a chronic appearance concentric lamellations (image 34/3). There is a caliber change at the splenic flexure leading to collapsed descending colon. This caliber change present on image 20/3 of axial imaging. A stricturing is well seen on image 150/series 7/sagittal imaging. There several large stool ball again retained proximal to this stricturing of the splenic flexure. Several diverticula descending colon.  Fluid within the rectum. Vascular/Lymphatic: Abdominal aorta is normal caliber with atherosclerotic calcification. There is no retroperitoneal or periportal lymphadenopathy. No pelvic lymphadenopathy. Reproductive: Uterus and adnexa unremarkable. Other: No free fluid or free air. Musculoskeletal: No aggressive osseous lesion. IMPRESSION: 1. 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 Three large chronic lamellated stool balls proximal to this stricturing. Differential includes benign and malignant stricturing of the  colon. 2. No lymphadenopathy. Electronically Signed   By: Suzy Bouchard M.D.   On: 11/26/2020 14:52        Scheduled Meds: . darifenacin  7.5 mg Oral Daily  . diltiazem  180 mg Oral Daily  . docusate sodium  100 mg Oral BID  . doxepin  10 mg Oral Daily  . DULoxetine  60 mg Oral QHS  . heparin  5,000 Units Subcutaneous Q8H  . hydroxychloroquine  200 mg Oral BID  . metoprolol tartrate  25 mg Oral BID  . mirabegron ER  25 mg Oral Daily  . pantoprazole  40 mg Oral Daily  . peg 3350 powder  0.5 kit Oral Once  . polyethylene glycol  17 g Oral BID  . prazosin  1 mg Oral QHS  . predniSONE  5 mg Oral Daily  . QUEtiapine  25 mg Oral Daily   Continuous Infusions: . sodium chloride 125 mL/hr at 11/27/20 0202     LOS: 1 day    Time spent: 35 minutes.     Elmarie Shiley, MD [facility]  If 7PM-7AM, please contact night-coverage [email or web address]  11/27/2020, 7:55 AM

## 2020-11-28 LAB — CBC
HCT: 43.6 % (ref 36.0–46.0)
HCT: 35.2 % — ABNORMAL LOW (ref 36.0–46.0)
Hemoglobin: 13.8 g/dL (ref 12.0–15.0)
Hemoglobin: 11.5 g/dL — ABNORMAL LOW (ref 12.0–15.0)
MCH: 30.6 pg (ref 26.0–34.0)
MCH: 30.7 pg (ref 26.0–34.0)
MCHC: 31.7 g/dL (ref 30.0–36.0)
MCHC: 32.7 g/dL (ref 30.0–36.0)
MCV: 96.7 fL (ref 80.0–100.0)
MCV: 94.1 fL (ref 80.0–100.0)
Platelets: 234 10*3/uL (ref 150–400)
Platelets: 207 10*3/uL (ref 150–400)
RBC: 4.51 MIL/uL (ref 3.87–5.11)
RBC: 3.74 MIL/uL — ABNORMAL LOW (ref 3.87–5.11)
RDW: 13.6 % (ref 11.5–15.5)
RDW: 13.5 % (ref 11.5–15.5)
WBC: 8.3 10*3/uL (ref 4.0–10.5)
WBC: 8.4 10*3/uL (ref 4.0–10.5)
nRBC: 0 % (ref 0.0–0.2)
nRBC: 0 % (ref 0.0–0.2)

## 2020-11-28 LAB — BASIC METABOLIC PANEL
Anion gap: 13 (ref 5–15)
BUN: 27 mg/dL — ABNORMAL HIGH (ref 8–23)
CO2: 23 mmol/L (ref 22–32)
Calcium: 8.2 mg/dL — ABNORMAL LOW (ref 8.9–10.3)
Chloride: 102 mmol/L (ref 98–111)
Creatinine, Ser: 1.64 mg/dL — ABNORMAL HIGH (ref 0.44–1.00)
GFR, Estimated: 34 mL/min — ABNORMAL LOW (ref >60)
Glucose, Bld: 79 mg/dL (ref 70–99)
Potassium: 4 mmol/L (ref 3.5–5.1)
Sodium: 138 mmol/L (ref 135–145)

## 2020-11-28 MED ORDER — HEPARIN SODIUM (PORCINE) 5000 UNIT/ML IJ SOLN
5000.0000 [IU] | Freq: Three times a day (TID) | INTRAMUSCULAR | Status: AC
  Administered 2020-11-28 (×2): 5000 [IU] via SUBCUTANEOUS
  Filled 2020-11-28 (×2): qty 1

## 2020-11-28 MED ORDER — PEG 3350-KCL-NA BICARB-NACL 420 G PO SOLR
4000.0000 mL | Freq: Once | ORAL | Status: AC
  Administered 2020-11-28: 4000 mL via ORAL
  Filled 2020-11-28: qty 4000

## 2020-11-28 NOTE — H&P (View-Only) (Signed)
Per RN, patient still having brown stools that are not clear, despite finishing Moviprep.    We will postpone colonoscopy to tomorrow.  Ordered NuLYTELY prep.  Plan for patient to drink 8 oz every 30-45 minutes, with goal of at least half of jug.  Clear liquid diet, NPO after midnight.  [facility] will follow. 

## 2020-11-28 NOTE — Evaluation (Signed)
Occupational Therapy Evaluation/Discharge Patient Details Name: Michelle Graves MRN: [id number] DOB: 05-04-1951 Today's Date: 11/28/2020    History of Present Illness Pt is a 70 y/o female admitted secondary to  1 week history of constipation and abdominal pain and large watery diarrhea.  CT abdomen And pelvis showed stricture of the transverse colon at the splenic flexure. PMH includes RA, CKD, and HTN.   Clinical Impression   PTA, pt lives alone and reports Modified Independence with ADLs and mobility using RW inside and Rollator in the community. Pt presents now with diagnoses above and mild deficits in endurance and strength. Pt able to demonstrate bed mobility, short mobility in hall and to bathroom using RW at Supervision level. No overt LOB noted though pt endorses feeling weak. Pt Independent for UB ADLs and Supervision for LB ADLs to ensure safety, including toileting hygiene during session. Anticipate after colonoscopy and pt able to eat food, strength will improve and pt will return to baseline quickly. Encouraged pt to have daughter check in on pt at home initially and assist with IADLs until return to baseline functioning. Encouraged pt to walk to/from bathroom with staff while admitted. Pt verbalized understanding of all education. No further skilled OT services needed at this time. Please reconsult if needs change.     Follow Up Recommendations  No OT follow up;Supervision - Intermittent    Equipment Recommendations  None recommended by OT (has all needed DME)    Recommendations for Other Services       Precautions / Restrictions Precautions Precautions: Fall Restrictions Weight Bearing Restrictions: No      Mobility Bed Mobility Overal bed mobility: Needs Assistance Bed Mobility: Supine to Sit;Sit to Supine     Supine to sit: Supervision Sit to supine: Supervision   General bed mobility comments: Supervision for safety with lines. Increased time required.     Transfers Overall transfer level: Needs assistance Equipment used: Rolling walker (2 wheeled) Transfers: Sit to/from Stand Sit to Stand: Supervision         General transfer comment: Supervision, no physical assist given. min guard initially for mobility progressing quickly to supervision with no overt LOB noted    Balance Overall balance assessment: Needs assistance Sitting-balance support: No upper extremity supported;Feet supported Sitting balance-[name]: Good     Standing balance support: Bilateral upper extremity supported;During functional activity Standing balance-[name]: Fair Standing balance comment: fair static standing. benefits from UE support for dynamic tasks                           ADL either performed or assessed with clinical judgement   ADL Overall ADL's : Needs assistance/impaired Eating/Feeding: Independent;Sitting Eating/Feeding Details (indicate cue type and reason): liquid diet Grooming: Set up;Standing   Upper Body Bathing: Independent;Sitting   Lower Body Bathing: Supervison/ safety;Sit to/from stand   Upper Body Dressing : Set up;Sitting   Lower Body Dressing: Supervision/safety;Sit to/from stand   Toilet Transfer: Supervision/safety;Ambulation;Regular Toilet;Grab bars;RW Statistician Details (indicate cue type and reason): min guard progressing to supervision, no overt LOB noted. minor cues for RW use Toileting- Clothing Manipulation and Hygiene: Supervision/safety;Sit to/from stand Toileting - Clothing Manipulation Details (indicate cue type and reason): Supervision for safety but assist needed. able to manage clothing and hygiene without issue     Functional mobility during ADLs: Supervision/safety;Rolling walker;Cueing for sequencing;Cueing for safety General ADL Comments: Pt with minor deficits in strength and endurance as pt with diarhea, hasnt  eaten in 2 days, likely dehydrated as well.     Vision Patient  Visual Report: No change from baseline Vision Assessment?: No apparent visual deficits     Perception     Praxis      Pertinent Vitals/Pain Pain Assessment: No/denies pain     Hand Dominance Left   Extremity/Trunk Assessment Upper Extremity Assessment Upper Extremity Assessment: Generalized weakness   Lower Extremity Assessment Lower Extremity Assessment: Defer to PT evaluation   Cervical / Trunk Assessment Cervical / Trunk Assessment: Normal   Communication Communication Communication: No difficulties   Cognition Arousal/Alertness: Awake/alert Behavior During Therapy: WFL for tasks assessed/performed Overall Cognitive Status: No family/caregiver present to determine baseline cognitive functioning Area of Impairment: Safety/judgement                         Safety/Judgement: Decreased awareness of safety     General Comments: minor decreased awareness of line safety, often attempting to leave IV pole behind. cues for RW use and sequencing (to stay close to RW). no other major concerns noted   General Comments  VSS on RA. Pt endorses feeling weak and hungry. Encouraged pt that after procedure, ability to have meal, pt will begin feeling better. Encouraged pt to walk to bathroom with staff to further improve endurance/strength    Exercises     Shoulder Instructions      Home Living Family/patient expects to be discharged to:: Private residence Living Arrangements: Alone Available Help at Discharge: Family;Available PRN/intermittently Type of Home: Apartment Home Access: Elevator     Home Layout: One level     Bathroom Shower/Tub: Chief Strategy Officer: Handicapped height     Home Equipment: Environmental consultant - 2 wheels;Walker - 4 wheels;Cane - quad;Shower seat;Bedside commode;Wheelchair - manual;Grab bars - toilet;Grab bars - tub/shower;Toilet riser;Other (comment) (life alert)          Prior Functioning/Environment Level of Independence:  Independent with assistive device(s)        Comments: Pt uses RW inside for mobility, Rollator in the community. Pt reports Modified Independent with ADLs, has shower chair to sit on as needed. Goes shopping in community with sister        OT Problem List:        OT Treatment/Interventions:      OT Goals(Current goals can be found in the care plan section) Acute Rehab OT Goals Patient Stated Goal: Feel better, be able to eat after procedure OT Goal Formulation: All assessment and education complete, DC therapy  OT Frequency:     Barriers to D/C:            Co-evaluation              AM-PAC OT "6 Clicks" Daily Activity     Outcome Measure Help from another person eating meals?: None Help from another person taking care of personal grooming?: A Little Help from another person toileting, which includes using toliet, bedpan, or urinal?: A Little Help from another person bathing (including washing, rinsing, drying)?: A Little Help from another person to put on and taking off regular upper body clothing?: None Help from another person to put on and taking off regular lower body clothing?: A Little 6 Click Score: 20   End of Session Equipment Utilized During Treatment: Gait belt;Rolling walker Nurse Communication: Mobility status  Activity Tolerance: Patient tolerated treatment well Patient left: in bed;with call bell/phone within reach;with bed alarm set  OT Visit Diagnosis:  Muscle weakness (generalized) (M62.81)                Time: 9675-9163 OT Time Calculation (min): 23 min Charges:  OT General Charges $OT Visit: 1 Visit OT Evaluation $OT Eval Low Complexity: 1 Low OT Treatments $Self Care/Home Management : 8-22 mins  Lorre Munroe, OTR/L  Lorre Munroe 11/28/2020, 8:18 AM

## 2020-11-28 NOTE — Progress Notes (Signed)
Per RN, patient still having brown stools that are not clear, despite finishing Moviprep.    We will postpone colonoscopy to tomorrow.  Ordered NuLYTELY prep.  Plan for patient to drink 8 oz every 30-45 minutes, with goal of at least half of jug.  Clear liquid diet, NPO after midnight.  [facility] will follow.

## 2020-11-28 NOTE — TOC Initial Note (Signed)
Transition of Care [facility]) - Initial/Assessment Note    Patient Details  Name: Michelle Graves MRN: [id number] Date of Birth: 1951/03/05  Transition [facility]) CM/SW Contact:    Kingsley Plan, RN Phone Number: 11/28/2020, 11:21 AM  Clinical Narrative:                  Patient from home alone. Daughter lives next door and is able to assist. Discussed PT recommendation.  Patient has had [facility] home [facility] in the past and would like them again if possible. Already has DME at home already. Expected Discharge Plan: Home w Home Health Services Barriers to Discharge: Continued Medical Work up   Patient Goals and CMS Choice Patient states their goals for this hospitalization and ongoing recovery are:: to return to home CMS Medicare.gov Compare Post Acute Care list provided to:: Patient Choice offered to / list presented to : Patient  Expected Discharge Plan and Services Expected Discharge Plan: Home w Home Health Services   Discharge Planning Services: CM Consult Post Acute Care Choice: Home Health Living arrangements for the past 2 months: Single Family Home                 DME Arranged: N/A DME Agency: NA       HH Arranged: PT          Prior Living Arrangements/Services Living arrangements for the past 2 months: Single Family Home Lives with:: Self Patient language and need for interpreter reviewed:: Yes Do you feel safe going back to the place where you live?: Yes      Need for Family Participation in Patient Care: Yes (Comment) Care giver support system in place?: Yes (comment) Current home services: DME Criminal Activity/Legal Involvement Pertinent to Current Situation/Hospitalization: No - Comment as needed  Activities of Daily Living Home Assistive Devices/Equipment: None ADL Screening (condition at time of admission) Patient's cognitive ability adequate to safely complete daily activities?: Yes Is the patient deaf or have difficulty hearing?: No Does  the patient have difficulty seeing, even when wearing glasses/contacts?: No Does the patient have difficulty concentrating, remembering, or making decisions?: No Patient able to express need for assistance with ADLs?: Yes Does the patient have difficulty dressing or bathing?: Yes Independently performs ADLs?: No Communication: Independent Dressing (OT): Independent with device (comment) Grooming: Independent Feeding: Independent Bathing: Independent Toileting: Needs assistance Is this a change from baseline?: Pre-admission baseline In/Out Bed: Needs assistance Is this a change from baseline?: Pre-admission baseline Walks in Home: Needs assistance Is this a change from baseline?: Pre-admission baseline Does the patient have difficulty walking or climbing stairs?: Yes Weakness of Legs: Both Weakness of Arms/Hands: None  Permission Sought/Granted   Permission granted to share information with : No              Emotional Assessment Appearance:: Appears stated age Attitude/Demeanor/Rapport: Engaged Affect (typically observed): Accepting Orientation: : Oriented to Self,Oriented to Place,Oriented to  Time,Oriented to Situation Alcohol / Substance Use: Not Applicable Psych Involvement: No (comment)  Admission diagnosis:  Colonic stricture (HCC) [K56.699] AKI (acute kidney injury) (HCC) [N17.9] Patient Active Problem List   Diagnosis Date Noted  . 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 Bowel obstruction (HCC) 05/06/2020  . SIRS (systemic inflammatory response syndrome) (HCC) 05/06/2020  . UTI (urinary tract infection) 11/08/2019  . Major depressive disorder, recurrent episode, moderate (HCC) 11/08/2019  . Urinary retention 10/28/2019  . Acute on chronic kidney failure (HCC) 10/27/2019  . Malnutrition of moderate degree 10/03/2019  .  Colitis 09/30/2019  . AKI (acute kidney injury) (HCC) 05/28/2019  . Nausea & vomiting 05/28/2019  . 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 Anemia,  chronic disease 05/28/2019  . Swelling   . Small vessel disease, cerebrovascular   . Acute ischemic stroke (HCC)   . Weakness   . Acute left ankle pain   . Acute on chronic renal failure (HCC) 05/13/2019  . Weakness of both lower extremities 05/13/2019  . Cerebral thrombosis with cerebral infarction 03/31/2019  . New onset a-fib (HCC) 03/27/2019  . Acute kidney injury superimposed on CKD (HCC) 03/24/2019  . Hypotension 03/24/2019  . Hematemesis 03/24/2019  . Dark stools 03/24/2019  . Diverticulitis of intestine without perforation or abscess without bleeding   . Sinus tachycardia 08/27/2016  . Diverticulitis 08/26/2016  . HYPERTENSION, MALIGNANT ESSENTIAL 06/24/2007  . KIDNEY DISEASE, CHRONIC, STAGE III 06/24/2007  . ARTHRITIS, RHEUMATOID, SEROPOSITIVE 06/24/2007   PCP:  [facility]:   [id number] - [name], [place] - [address] AT [facility] OF [address] [place] [id number] Phone: [phone] Fax: [phone]     Social Determinants of Health (SDOH) Interventions    Readmission Risk Interventions Readmission Risk Prevention Plan 05/10/2020 05/10/2020 10/28/2019  Transportation Screening - Complete Complete  PCP or Specialist Appt within 3-5 Days - - -  Not Complete comments - - -  HRI or Home Care Consult - - -  HRI or Home Care Consult comments - - -  Social Work Consult for Recovery Care Planning/Counseling - - -  Palliative Care Screening - - -  Medication Review [facility]) - Referral to Pharmacy Referral to Pharmacy  PCP or Specialist appointment within 3-5 days of discharge Complete Complete Not Complete  PCP/Specialist Appt Not Complete comments - - due to holiday, pt to make appointment  HRI or Home Care Consult - Complete Complete  SW Recovery Care/Counseling Consult - - Complete  Palliative Care Screening - Not Applicable Not Applicable  Skilled Nursing  Facility - Patient Refused Complete  Some recent data might be hidden

## 2020-11-28 NOTE — Progress Notes (Signed)
PROGRESS NOTE    Michelle Graves  ZOX:[id number] DOB: November 23, 1950 DOA: 11/26/2020 PCP: [facility]   Chief Complain: Abdomen pain, diarrhea  Brief Narrative: Patient is a 70 year old female with history of rheumatoid arthritis, CKD stage IIIb, hypertension, diverticulitis with sequelae of colonic structure presented with abdomen pain, diarrhea.  She was hospitalized on last July 2021 for chronic obstruction at that time CT had shown nonobstructive stenosis of the splenic flexure.  Patient had developed abd pain and vomiting and went to [facility] on 11/18/2020 and was treated with enema and was released home.  She presented this time with 1 week history of constipation, abdominal pain, large watery diarrhea.  CT abdomen pelvis showed a stricture of the transverse colon splenic flexure and also 3 large chronic lamellated stool balls  proximal to the distal stricturing.  She was also found to have AKI on presentation.  GI consulted and the plan is for colonoscopy tiomorrow.  Assessment & Plan:   Active Problems:   AKI (acute kidney injury) (HCC)   Colonic stricture (HCC)   Abdominal pain/diarrhea: Secondary to colonic stricture.  CT imagings as above.  GI planning for colonoscopy tomorrow. Continue supportive care, pain management, IV fluids  AKI on CKD stage IIIb: Baseline creatinine ranges from 1.3-1.8.  Creatinine on presentation was 2.3.  Creatinine has returned to baseline with IV fluids.  AKI is related to poor oral intake, hypovolemia.  She follows with [facility]  Rheumatoid arthritis: On Plaquenil and prednisone  Hypertension: Metoprolol, Cardizem, transition.  Continue monitoring blood pressure.  Continue current medications  Leukocytosis: Resolved  Mood disorder: On Cymbalta, Sinequan, Seroquel  Obesity: BMI of 30         DVT prophylaxis:Heparin [place] Code Status: Full Family Communication: None at bedside Status is:  Inpatient  Remains inpatient appropriate because:Inpatient level of care appropriate due to severity of illness   Dispo: The patient is from: Home              Anticipated d/c is to: Home              Anticipated d/c date is: 1 day              Patient currently is not medically stable to d/c.   Difficult to place patient No      Consultants: GI  Procedures:Colonoscopy  Antimicrobials:  Anti-infectives (From admission, onward)   Start     Dose/Rate Route Frequency Ordered Stop   11/26/20 2200  hydroxychloroquine (PLAQUENIL) tablet 200 mg        200 mg Oral 2 times daily 11/26/20 1712        Subjective: Patient seen and examined the bedside this morning.  Comfortable.  Denies any abdomen pain, nausea or vomiting.  Abdomen soft and nontender during my evaluation today. Objective: Vitals:   11/27/20 1253 11/27/20 1740 11/28/20 0012 11/28/20 0651  BP: (!) 153/93 (!) 145/94 (!) 152/93 (!) 143/99  Pulse: 73 72 80 76  Resp: Temp: 97.9 F (36.6 C) 98.4 F (36.9 C) 98.9 F (37.2 C) 98.7 F (37.1 C)  TempSrc: Oral Oral Oral Oral  SpO2: 98% 100% 95% 96%  Weight:      Height:        Intake/Output Summary (Last 24 hours) at 11/28/2020 0751 Last data filed at 11/28/2020 0500 Gross per 24 hour  Intake 1289.82 ml  Output -  Net 1289.82 ml   [employer]  11/26/20 0911 11/27/20 0400  Weight: 87.1 kg 87.1 kg    Examination:  General exam: Appears calm and comfortable ,Not in distress,pleasant female HEENT:PERRL,Oral mucosa moist, Ear/Nose normal on gross exam Respiratory system: Bilateral equal air entry, normal vesicular breath sounds, no wheezes or crackles  Cardiovascular system: S1 & S2 heard, RRR. No JVD, murmurs, rubs, gallops or clicks. No pedal edema. Gastrointestinal system: Abdomen is nondistended, soft and nontender. No organomegaly or masses felt. Normal bowel sounds heard. Central nervous system: Alert and oriented. No focal neurological  deficits. Extremities: No edema, no clubbing ,no cyanosis Skin: No rashes, lesions or ulcers,no icterus ,no pallor   Data Reviewed: I have personally reviewed following labs and imaging studies  CBC: Recent Labs  Lab 11/22/20 0140 11/26/20 1130 11/27/20 2157 11/28/20 0323  WBC 9.7 16.1* 8.3 8.4  HGB 11.4* 12.5 13.8 11.5*  HCT 34.2* 39.8 43.6 35.2*  MCV 95.5 97.8 96.7 94.1  PLT 229 247 234 207   Basic Metabolic Panel: Recent Labs  Lab 11/22/20 0140 11/26/20 1035 11/27/20 0230 11/28/20 0323  NA 138 134* 135 138  K 4.7 5.0 4.8 4.0  CL 103 99 99 102  CO2 25 24 26 23   GLUCOSE 150* 127* 90 79  BUN 42* 50* 45* 27*  CREATININE 1.86* 2.38* 2.35* 1.64*  CALCIUM 9.0 9.6 8.6* 8.2*   GFR: Estimated Creatinine Clearance: 37.2 mL/min (A) (by C-G formula based on SCr of 1.64 mg/dL (H)). Liver Function Tests: Recent Labs  Lab 11/22/20 0347 11/26/20 1035 11/27/20 0230  AST 17 17 15   ALT 16 15 15   ALKPHOS 94 87 96  BILITOT 0.3 0.8 0.9  PROT 7.2 6.8 6.6  ALBUMIN 3.7 3.6 3.4*   Recent Labs  Lab 11/22/20 0347 11/26/20 1035  LIPASE 23 25   No results for input(s): AMMONIA in the last 168 hours. Coagulation Profile: No results for input(s): INR, PROTIME in the last 168 hours. Cardiac Enzymes: No results for input(s): CKTOTAL, CKMB, CKMBINDEX, TROPONINI in the last 168 hours. BNP (last 3 results) No results for input(s): PROBNP in the last 8760 hours. HbA1C: No results for input(s): HGBA1C in the last 72 hours. CBG: No results for input(s): GLUCAP in the last 168 hours. Lipid Profile: No results for input(s): CHOL, HDL, LDLCALC, TRIG, CHOLHDL, LDLDIRECT in the last 72 hours. Thyroid Function Tests: No results for input(s): TSH, T4TOTAL, FREET4, T3FREE, THYROIDAB in the last 72 hours. Anemia Panel: No results for input(s): VITAMINB12, FOLATE, FERRITIN, TIBC, IRON, RETICCTPCT in the last 72 hours. Sepsis Labs: No results for input(s): PROCALCITON, LATICACIDVEN in the  last 168 hours.  Recent Results (from the past 240 hour(s))  SARS Coronavirus 2 by RT PCR (hospital order, performed in [facility] hospital lab) Nasopharyngeal Nasopharyngeal Swab     Status: None   Collection Time: 11/26/20 10:27 PM   Specimen: Nasopharyngeal Swab  Result Value Ref Range Status   SARS Coronavirus 2 NEGATIVE NEGATIVE Final    Comment: (NOTE) SARS-CoV-2 target nucleic acids are NOT DETECTED.  The SARS-CoV-2 RNA is generally detectable in upper and lower respiratory specimens during the acute phase of infection. The lowest concentration of SARS-CoV-2 viral copies this assay can detect is 250 copies / mL. A negative result does not preclude SARS-CoV-2 infection and should not be used as the sole basis for treatment or other patient management decisions.  A negative result may occur with improper specimen collection / handling, submission of specimen other than nasopharyngeal swab, presence of viral mutation(s)  within the areas targeted by this assay, and inadequate number of viral copies (<250 copies / mL). A negative result must be combined with clinical observations, patient history, and epidemiological information.  Fact Sheet for Patients:   BoilerBrush.com.cy  Fact Sheet for Healthcare Providers: https://pope.com/  This test is not yet approved or  cleared by the [place] FDA and has been authorized for detection and/or diagnosis of SARS-CoV-2 by FDA under an Emergency Use Authorization (EUA).  This EUA will remain in effect (meaning this test can be used) for the duration of the COVID-19 declaration under Section 564(b)(1) of the Act, 21 U.S.C. section 360bbb-3(b)(1), unless the authorization is terminated or revoked sooner.  Performed at [facility], [address], [place], [place] [address]          Radiology Studies: CT Abdomen Pelvis Wo Contrast  Result Date: 11/26/2020 CLINICAL DATA:   Diarrhea.  Generalized weakness.  Colonic stricture. EXAM: CT ABDOMEN AND PELVIS WITHOUT CONTRAST TECHNIQUE: Multidetector CT imaging of the abdomen and pelvis was performed following the standard protocol without IV contrast. COMPARISON:  Virtual colonoscopy 02/15/2020, single-contrast barium enema 05/08/2020. FINDINGS: Lower chest: Mild scarring at the lung bases. Hepatobiliary: No focal hepatic lesion on noncontrast exam. Gallbladder normal Pancreas: Pancreas is normal. No ductal dilatation. No pancreatic inflammation. Spleen: Normal spleen Adrenals/urinary tract: Adrenal glands normal. Nephrolithiasis ureterolithiasis. Bladder normal Stomach/Bowel: Stomach, duodenum and small bowel normal. Terminal ileum is normal. Appendix not identified. There is fluid stool in the ascending colon. There is several large stool balls in the distal transverse colon leading up the splenic flexure. These have a chronic appearance concentric lamellations (image 34/3). There is a caliber change at the splenic flexure leading to collapsed descending colon. This caliber change present on image 20/3 of axial imaging. A stricturing is well seen on image 150/series 7/sagittal imaging. There several large stool ball again retained proximal to this stricturing of the splenic flexure. Several diverticula descending colon.  Fluid within the rectum. Vascular/Lymphatic: Abdominal aorta is normal caliber with atherosclerotic calcification. There is no retroperitoneal or periportal lymphadenopathy. No pelvic lymphadenopathy. Reproductive: Uterus and adnexa unremarkable. Other: No free fluid or free air. Musculoskeletal: No aggressive osseous lesion. IMPRESSION: 1. 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 Three large chronic lamellated stool balls proximal to this stricturing. Differential includes benign and malignant stricturing of the colon. 2. No lymphadenopathy. Electronically Signed   By: Genevive Bi M.D.    On: 11/26/2020 14:52        Scheduled Meds: . darifenacin  7.5 mg Oral Daily  . diltiazem  180 mg Oral Daily  . docusate sodium  100 mg Oral BID  . doxepin  10 mg Oral Daily  . DULoxetine  60 mg Oral QHS  . heparin  5,000 Units Subcutaneous Q8H  . hydroxychloroquine  200 mg Oral BID  . metoprolol tartrate  25 mg Oral BID  . mirabegron ER  25 mg Oral Daily  . pantoprazole  40 mg Oral Daily  . polyethylene glycol  17 g Oral BID  . prazosin  1 mg Oral QHS  . predniSONE  5 mg Oral Daily  . QUEtiapine  25 mg Oral Daily   Continuous Infusions: . sodium chloride 125 mL/hr at 11/27/20 1839  . sodium chloride 20 mL/hr at 11/28/20 0541     LOS: 2 days    Time spent: 25 mins.More than 50% of that time was spent in counseling and/or coordination of care.  Burnadette Pop, MD [facility] P2/11/2020, 7:51 AM

## 2020-11-29 ENCOUNTER — Inpatient Hospital Stay (HOSPITAL_COMMUNITY): Payer: Medicare Other | Admitting: Anesthesiology

## 2020-11-29 ENCOUNTER — Encounter (HOSPITAL_COMMUNITY): Payer: Self-pay | Admitting: Internal Medicine

## 2020-11-29 ENCOUNTER — Encounter (HOSPITAL_COMMUNITY)
Admission: EM | Disposition: A | Discharge status: Home / Self Care | Payer: Self-pay | Source: Home / Self Care | Attending: Internal Medicine

## 2020-11-29 HISTORY — PX: POLYPECTOMY: SHX5525

## 2020-11-29 HISTORY — PX: BIOPSY: SHX5522

## 2020-11-29 HISTORY — PX: COLONOSCOPY: SHX5424

## 2020-11-29 HISTORY — PX: SUBMUCOSAL TATTOO INJECTION: SHX6856

## 2020-11-29 LAB — BASIC METABOLIC PANEL
Anion gap: 11 (ref 5–15)
BUN: 17 mg/dL (ref 8–23)
CO2: 24 mmol/L (ref 22–32)
Calcium: 8.9 mg/dL (ref 8.9–10.3)
Chloride: 105 mmol/L (ref 98–111)
Creatinine, Ser: 1.47 mg/dL — ABNORMAL HIGH (ref 0.44–1.00)
GFR, Estimated: 39 mL/min — ABNORMAL LOW (ref >60)
Glucose, Bld: 89 mg/dL (ref 70–99)
Potassium: 4.5 mmol/L (ref 3.5–5.1)
Sodium: 140 mmol/L (ref 135–145)

## 2020-11-29 SURGERY — COLONOSCOPY
Anesthesia: Monitor Anesthesia Care

## 2020-11-29 MED ORDER — LABETALOL HCL 5 MG/ML IV SOLN
INTRAVENOUS | Status: AC
  Filled 2020-11-29: qty 4

## 2020-11-29 MED ORDER — HYDRALAZINE HCL 20 MG/ML IJ SOLN
10.0000 mg | Freq: Once | INTRAMUSCULAR | Status: AC
  Administered 2020-11-29: 10 mg via INTRAVENOUS

## 2020-11-29 MED ORDER — LACTATED RINGERS IV SOLN: INTRAVENOUS | Status: DC

## 2020-11-29 MED ORDER — SPOT INK MARKER SYRINGE KIT
PACK | SUBMUCOSAL | Status: DC | PRN
  Administered 2020-11-29: 8 mL via SUBMUCOSAL

## 2020-11-29 MED ORDER — METOPROLOL TARTRATE 50 MG PO TABS
50.0000 mg | ORAL_TABLET | Freq: Two times a day (BID) | ORAL | Status: DC
  Administered 2020-11-30 (×2): 50 mg via ORAL
  Filled 2020-11-29 (×3): qty 1

## 2020-11-29 MED ORDER — SPOT INK MARKER SYRINGE KIT
PACK | SUBMUCOSAL | Status: AC
  Filled 2020-11-29: qty 5

## 2020-11-29 MED ORDER — LABETALOL HCL 5 MG/ML IV SOLN
20.0000 mg | Freq: Once | INTRAVENOUS | Status: AC
  Administered 2020-11-29: 20 mg via INTRAVENOUS

## 2020-11-29 MED ORDER — PROPOFOL 10 MG/ML IV BOLUS
INTRAVENOUS | Status: DC | PRN
  Administered 2020-11-29: 25 mg via INTRAVENOUS

## 2020-11-29 MED ORDER — LABETALOL HCL 5 MG/ML IV SOLN
20.0000 mg | Freq: Once | INTRAVENOUS | Status: AC
  Administered 2020-11-29: 20 mg via INTRAVENOUS
  Filled 2020-11-29: qty 4

## 2020-11-29 MED ORDER — LABETALOL HCL 5 MG/ML IV SOLN
10.0000 mg | Freq: Once | INTRAVENOUS | Status: AC
  Administered 2020-11-29: 10 mg via INTRAVENOUS

## 2020-11-29 MED ORDER — LABETALOL HCL 5 MG/ML IV SOLN
10.0000 mg | INTRAVENOUS | Status: DC | PRN
  Administered 2020-11-29 – 2020-12-01 (×5): 10 mg via INTRAVENOUS
  Filled 2020-11-29 (×5): qty 4

## 2020-11-29 MED ORDER — PROPOFOL 500 MG/50ML IV EMUL
INTRAVENOUS | Status: DC | PRN
  Administered 2020-11-29: 100 ug/kg/min via INTRAVENOUS

## 2020-11-29 MED ORDER — MIDAZOLAM HCL 2 MG/2ML IJ SOLN: INTRAMUSCULAR | Status: DC | PRN

## 2020-11-29 MED ORDER — HYDRALAZINE HCL 20 MG/ML IJ SOLN
INTRAMUSCULAR | Status: AC
  Filled 2020-11-29: qty 1

## 2020-11-29 MED ORDER — PRAZOSIN HCL 2 MG PO CAPS
2.0000 mg | ORAL_CAPSULE | Freq: Every day | ORAL | Status: DC
Start: 2020-11-29 — End: 2020-12-01
  Administered 2020-11-30: 2 mg via ORAL
  Filled 2020-11-29 (×3): qty 1

## 2020-11-29 MED ORDER — LACTATED RINGERS IV SOLN
INTRAVENOUS | Status: AC | PRN
  Administered 2020-11-29: 10 mL/h via INTRAVENOUS

## 2020-11-29 NOTE — Progress Notes (Signed)
Pt transported to Endo 

## 2020-11-29 NOTE — Anesthesia Preprocedure Evaluation (Signed)
Anesthesia Evaluation  Patient identified by MRN, date of birth, ID band Patient awake    Reviewed: Allergy & Precautions, NPO status , Patient's Chart, lab work & pertinent test results  History of Anesthesia Complications Negative for: history of anesthetic complications  Airway Mallampati: II  TM Distance: >3 FB Neck ROM: Full    Dental   Pulmonary neg pulmonary ROS,    Pulmonary exam normal        Cardiovascular hypertension, Normal cardiovascular exam+ dysrhythmias Atrial Fibrillation      Neuro/Psych CVA negative psych ROS   GI/Hepatic Neg liver ROS, Colonic stricture   Endo/Other  negative endocrine ROS  Renal/GU CRF and ARFRenal disease  negative genitourinary   Musculoskeletal  (+) Arthritis , Rheumatoid disorders,    Abdominal   Peds  Hematology  (+) anemia ,   Anesthesia Other Findings   Reproductive/Obstetrics                             Anesthesia Physical Anesthesia Plan  ASA: III  Anesthesia Plan: MAC   Post-op Pain Management:    Induction: Intravenous  PONV Risk Score and Plan: Propofol infusion, TIVA and Treatment may vary due to age or medical condition  Airway Management Planned: Natural Airway, Nasal Cannula and Simple Face Mask  Additional Equipment: None  Intra-op Plan:   Post-operative Plan:   Informed Consent: I have reviewed the patients History and Physical, chart, labs and discussed the procedure including the risks, benefits and alternatives for the proposed anesthesia with the patient or authorized representative who has indicated his/her understanding and acceptance.       Plan Discussed with:   Anesthesia Plan Comments:         Anesthesia Quick Evaluation

## 2020-11-29 NOTE — Progress Notes (Addendum)
Pt in pre procedure area with elevated BP. MD Darlin Drop with anesthesia made aware. This RN instructed to give scheduled medications that were missed beofre pt transferred to procedure area. Meds given- see MAR. BP still elevated despite antihypertensives. MD Darlin Drop and charge RN Carollee Herter aware.

## 2020-11-29 NOTE — Progress Notes (Signed)
PT Cancellation Note  Patient Details Name: KODIE PICK MRN: [id number] DOB: 04/16/1951   Cancelled Treatment:    Reason Eval/Treat Not Completed: Patient at procedure or test/unavailable. Pt currently off unit for procedure. Will check back as schedule allows to continue with PT POC.   Marylynn Pearson 11/29/2020, 11:56 AM   Conni Slipper, PT, DPT Acute Rehabilitation Services Pager: [phone] Office: [phone]

## 2020-11-29 NOTE — Progress Notes (Signed)
Patient back from Endo in NAD, free from pain.

## 2020-11-29 NOTE — Brief Op Note (Signed)
Long benign-appearing stricture in splenic flexure and proximal descending colon able to traverse and reach cecum. No mass seen in the strictured area. Proximal colon was poorly prepped likely due to the stricture. See endopro note for complete recs/findings. F/U on path as outpt. F/U with Dr. Levora Angel in 3-4 weeks. Will need surgical referral if abd pain or constipation worsens to evaluate for surgical resection of this stricture. Would keep overnight in hospital due to markedly elevated blood pressures and d/w Dr. Renford Dills. GI will sign off. Call if questions.

## 2020-11-29 NOTE — Progress Notes (Signed)
The patient was scheduled for a colonoscopy today after 3 days of colonic prep for suspected colonic stricture benign versus malignant noted on imaging.  I saw the patient in preop and endoscopy unit. Her systolic blood pressure is over 200 mmHg and diastolic blood pressure is 117 mmHg. So far she has received IV labetalol 10 mg twice, along with her oral dose of metoprolol and diltiazem.  We will observe her for the next few hours, if her blood pressure comes down to an acceptable range, plan colonoscopy, to be performed by Dr. Bosie Clos today afternoon.  Discussed the same with the patient, who understands and consents.

## 2020-11-29 NOTE — Anesthesia Procedure Notes (Signed)
Procedure Name: MAC Date/Time: 11/29/2020 3:02 PM Performed by: Imagene Riches, CRNA Pre-anesthesia Checklist: Patient identified, Emergency Drugs available, Suction available, Patient being monitored and Timeout performed Oxygen Delivery Method: Nasal cannula

## 2020-11-29 NOTE — Progress Notes (Signed)
PROGRESS NOTE    Michelle Graves  XFG:[id number] DOB: 07/21/1951 DOA: 11/26/2020 PCP: [facility]   Chief Complain: Abdomen pain, diarrhea  Brief Narrative: Patient is a 70 year old female with history of rheumatoid arthritis, CKD stage IIIb, hypertension, diverticulitis with sequelae of colonic structure presented with abdomen pain, diarrhea.  She was hospitalized on last July 2021 for chronic obstruction at that time CT had shown nonobstructive stenosis of the splenic flexure.  Patient had developed abd pain and vomiting and went to [facility] on 11/18/2020 and was treated with enema and was released home.  She presented this time with 1 week history of constipation, abdominal pain, large watery diarrhea.  CT abdomen pelvis showed a stricture of the transverse colon splenic flexure and also 3 large chronic lamellated stool balls  proximal to the distal stricturing.  She was also found to have AKI on presentation.  GI consulted and she underwent   colonoscopy today.  Assessment & Plan:   Active Problems:   AKI (acute kidney injury) (HCC)   Colonic stricture (HCC)   Abdominal pain/diarrhea: Presumed to be secondary to colonic stricture.  CT imagings as above.  Her abdominal pain and diarrhea have resolved.   She underwent colonoscopy today which showed long benign-appearing stricture in the splenic flexure and proximal descending colon, no mass seen in the strictured area, biopsies taken. GI recommended to follow-up with Dr. Doretha Imus in 3 to 4 weeks. If she develops abdominal pain or worsening constipation in the future, GI recommended for surgical evaluation as an outpatient for consideration of resection of the stricture. Continue supportive care, pain management.  AKI on CKD stage IIIb: Baseline creatinine ranges from 1.3-1.8.  Creatinine on presentation was 2.3.  Creatinine has returned to baseline with IV fluids.  AKI is related to poor oral intake,  hypovolemia.  She follows with [facility].Fluids D/Ced  Severe hypertension: Metoprolol, Cardizem, minipress.  Her blood pressure remains elevated today, treated with several doses of labetalol IV.  Continue monitoring blood pressure.  We increased the dose of metoprolol and Minipress.  Continue current medications, continue as needed medications for severe hypertension.  Rheumatoid arthritis: On Plaquenil and prednisone  Leukocytosis: Resolved  Mood disorder: On Cymbalta, Sinequan, Seroquel at home  Obesity: BMI of 30         DVT prophylaxis:Heparin [place] Code Status: Full Family Communication: None at bedside Status is: Inpatient  Remains inpatient appropriate because:Inpatient level of care appropriate due to severity of illness   Dispo: The patient is from: Home              Anticipated d/c is to: Home              Anticipated d/c date is: 1 day              Patient currently is not medically stable to d/c.   Difficult to place patient No Plan for discharge to home tomorrow.  Needs to be monitored overnight for severe hypertension.     Consultants: GI  Procedures:Colonoscopy  Antimicrobials:  Anti-infectives (From admission, onward)   Start     Dose/Rate Route Frequency Ordered Stop   11/26/20 2200  [MAR Hold]  hydroxychloroquine (PLAQUENIL) tablet 200 mg        (MAR Hold since Thu 11/29/2020 at 1501.Hold Reason: Transfer to a Procedural area.)   200 mg Oral 2 times daily 11/26/20 1712        Subjective:  Patient seen and  examined the bedside this morning.  Hemodynamically stable when I saw her at the bedside.  Blood pressure started trending up later throughout the day.  During my evaluation, she denied any abdomen pain, nausea or vomiting and also had a bowel movement earlier this morning.  Objective: Vitals:   11/29/20 1413 11/29/20 1620 11/29/20 1630 11/29/20 1640  BP: (!) 174/99 (!) 185/114 (!) 176/108 (!) 200/104  Pulse: 81 80  76  Resp: 15 (!) 22   (!) 24  Temp: 98.8 F (37.1 C) 98.2 F (36.8 C)    TempSrc: Oral Tympanic    SpO2: 99% 100%  98%  Weight: 86.2 kg     Height: 5\' 7"  (1.702 m)       Intake/Output Summary (Last 24 hours) at 11/29/2020 1645 Last data filed at 11/29/2020 1230 Gross per 24 hour  Intake 250 ml  Output --  Net 250 ml   Filed Weights   11/27/20 0400 11/29/20 1010 11/29/20 1413  Weight: 87.1 kg 86.2 kg 86.2 kg    Examination:  General exam: Appears calm and comfortable ,Not in distress,pleasant female HEENT:PERRL,Oral mucosa moist, Ear/Nose normal on gross exam Respiratory system: Bilateral equal air entry, normal vesicular breath sounds, no wheezes or crackles  Cardiovascular system: S1 & S2 heard, RRR.  Gastrointestinal system: Abdomen is nondistended, soft and nontender.Normal bowel sounds heard. Central nervous system: Alert and oriented. No focal neurological deficits. Extremities: No edema, no clubbing ,no cyanosis Skin: No rashes, lesions or ulcers,no icterus ,no pallor   Data Reviewed: I have personally reviewed following labs and imaging studies  CBC: Recent Labs  Lab 11/26/20 1130 11/27/20 2157 11/28/20 0323  WBC 16.1* 8.3 8.4  HGB 12.5 13.8 11.5*  HCT 39.8 43.6 35.2*  MCV 97.8 96.7 94.1  PLT 247 234 207   Basic Metabolic Panel: Recent Labs  Lab 11/26/20 1035 11/27/20 0230 11/28/20 0323 11/29/20 0120  NA 134* 135 138 140  K 5.0 4.8 4.0 4.5  CL 99 99 102 105  CO2 24 26 23 24   GLUCOSE 127* 90 79 89  BUN 50* 45* 27* 17  CREATININE 2.38* 2.35* 1.64* 1.47*  CALCIUM 9.6 8.6* 8.2* 8.9   GFR: Estimated Creatinine Clearance: 41.3 mL/min (A) (by C-G formula based on SCr of 1.47 mg/dL (H)). Liver Function Tests: Recent Labs  Lab 11/26/20 1035 11/27/20 0230  AST 17 15  ALT 15 15  ALKPHOS 87 96  BILITOT 0.8 0.9  PROT 6.8 6.6  ALBUMIN 3.6 3.4*   Recent Labs  Lab 11/26/20 1035  LIPASE 25   No results for input(s): AMMONIA in the last 168 hours. Coagulation  Profile: No results for input(s): INR, PROTIME in the last 168 hours. Cardiac Enzymes: No results for input(s): CKTOTAL, CKMB, CKMBINDEX, TROPONINI in the last 168 hours. BNP (last 3 results) No results for input(s): PROBNP in the last 8760 hours. HbA1C: No results for input(s): HGBA1C in the last 72 hours. CBG: No results for input(s): GLUCAP in the last 168 hours. Lipid Profile: No results for input(s): CHOL, HDL, LDLCALC, TRIG, CHOLHDL, LDLDIRECT in the last 72 hours. Thyroid Function Tests: No results for input(s): TSH, T4TOTAL, FREET4, T3FREE, THYROIDAB in the last 72 hours. Anemia Panel: No results for input(s): VITAMINB12, FOLATE, FERRITIN, TIBC, IRON, RETICCTPCT in the last 72 hours. Sepsis Labs: No results for input(s): PROCALCITON, LATICACIDVEN in the last 168 hours.  Recent Results (from the past 240 hour(s))  SARS Coronavirus 2 by RT PCR (hospital order, performed in [facility] hospital lab) Nasopharyngeal Nasopharyngeal Swab     Status: None   Collection Time: 11/26/20 10:27 PM   Specimen: Nasopharyngeal Swab  Result Value Ref Range Status   SARS Coronavirus 2 NEGATIVE NEGATIVE Final    Comment: (NOTE) SARS-CoV-2 target nucleic acids are NOT DETECTED.  The SARS-CoV-2 RNA is generally detectable in upper and lower respiratory specimens during the acute phase of infection. The lowest concentration of SARS-CoV-2 viral copies this assay can detect is 250 copies / mL. A negative result does not preclude SARS-CoV-2 infection and should not be used as the sole basis for treatment or other patient management decisions.  A negative result may occur with improper specimen collection / handling, submission of specimen other than nasopharyngeal swab, presence of viral mutation(s) within the areas targeted by this assay, and inadequate number of viral copies (<250 copies / mL). A negative result must be combined with clinical observations, patient history, and epidemiological  information.  Fact Sheet for Patients:   BoilerBrush.com.cy  Fact Sheet for Healthcare Providers: https://pope.com/  This test is not yet approved or  cleared by the [place] FDA and has been authorized for detection and/or diagnosis of SARS-CoV-2 by FDA under an Emergency Use Authorization (EUA).  This EUA will remain in effect (meaning this test can be used) for the duration of the COVID-19 declaration under Section 564(b)(1) of the Act, 21 U.S.C. section 360bbb-3(b)(1), unless the authorization is terminated or revoked sooner.  Performed at [facility], [address], [place], [place] [address]          Radiology Studies: No results found.      Scheduled Meds: . [MAR Hold] darifenacin  7.5 mg Oral Daily  . [MAR Hold] diltiazem  180 mg Oral Daily  . [MAR Hold] docusate sodium  100 mg Oral BID  . [MAR Hold] doxepin  10 mg Oral Daily  . [MAR Hold] DULoxetine  60 mg Oral QHS  . [MAR Hold] hydroxychloroquine  200 mg Oral BID  . [MAR Hold] metoprolol tartrate  50 mg Oral BID  . [MAR Hold] mirabegron ER  25 mg Oral Daily  . [MAR Hold] pantoprazole  40 mg Oral Daily  . [MAR Hold] prazosin  2 mg Oral QHS  . [MAR Hold] predniSONE  5 mg Oral Daily  . [MAR Hold] QUEtiapine  25 mg Oral Daily   Continuous Infusions: . lactated ringers 500 mL/hr at 11/29/20 1614     LOS: 3 days    Time spent: 25 mins.More than 50% of that time was spent in counseling and/or coordination of care.      Burnadette Pop, MD [facility] P2/12/2020, 4:45 PM

## 2020-11-29 NOTE — Op Note (Signed)
[facility] Patient Name: Michelle Graves Procedure Date : 11/29/2020 MRN: [id number] Attending MD: Shirley Friar , MD Date of Birth: 02-Apr-1951 CSN: [id number] Age: 70 Admit Type: Inpatient Procedure:                Colonoscopy Indications:              Last colonoscopy: July 2021, Generalized abdominal                            pain, Abnormal CT of the GI tract Providers:                Shirley Friar, MD, Charlett Lango, RN, Lawson Radar, Technician, Elliot Dally, CRNA Referring MD:             hospital team Medicines:                Propofol per Anesthesia, Monitored Anesthesia Care Complications:            No immediate complications. Estimated Blood Loss:     Estimated blood loss was minimal. Procedure:                Pre-Anesthesia Assessment:                           - Prior to the procedure, a History and Physical                            was performed, and patient medications and                            allergies were reviewed. The patient's tolerance of                            previous anesthesia was also reviewed. The risks                            and benefits of the procedure and the sedation                            options and risks were discussed with the patient.                            All questions were answered, and informed consent                            was obtained. Prior Anticoagulants: The patient has                            taken no previous anticoagulant or antiplatelet                            agents. ASA Grade Assessment: III - A patient with  severe systemic disease. After reviewing the risks                            and benefits, the patient was deemed in                            satisfactory condition to undergo the procedure.                           After obtaining informed consent, the colonoscope                            was passed under  direct vision. Throughout the                            procedure, the patient's blood pressure, pulse, and                            oxygen saturations were monitored continuously. The                            PCF-H190DL ([id number]) Olympus pediatric colonoscope                            was introduced through the anus and advanced to the                            the cecum, identified by appendiceal orifice and                            ileocecal valve. The colonoscopy was performed with                            difficulty due to unsatisfactory bowel prep, poor                            endoscopic visualization, significant looping and a                            tortuous colon. Successful completion of the                            procedure was aided by straightening and shortening                            the scope to obtain bowel loop reduction, using                            scope torsion, applying abdominal pressure and                            lavage. The patient tolerated the procedure well.  The quality of the bowel preparation was                            unsatisfactory. The ileocecal valve, appendiceal                            orifice, and rectum were photographed. Scope In: 3:33:55 PM Scope Out: 4:10:52 PM Scope Withdrawal Time: 0 hours 27 minutes 20 seconds  Total Procedure Duration: 0 hours 36 minutes 57 seconds  Findings:      The perianal and digital rectal examinations were normal.      A segmental area of severely congested, inflamed and scarred mucosa was       found in the proximal descending colon, at the splenic flexure and from       55 to 65 cm proximal to the anus consistent with a partially obstructing       colonic stricture. Biopsies were taken with a cold forceps for       histology. Estimated blood loss was minimal. Area was tattooed with an       injection of 8 cc Spot (carbon black).      A 6 mm polyp was  found in the proximal ascending colon. The polyp was       sessile. The polyp was removed with a hot snare. Resection and retrieval       were complete. Estimated blood loss: none.      Copious quantities of semi-liquid semi-solid solid stool was found in       the transverse colon, in the ascending colon and in the cecum,       precluding visualization.      Multiple small and large-mouthed diverticula were found in the sigmoid       colon and descending colon.      Internal hemorrhoids were found during retroflexion. The hemorrhoids       were medium-sized and Grade I (internal hemorrhoids that do not       prolapse). Impression:               - Preparation of the colon was unsatisfactory.                           - Congested, inflamed and scarred mucosa in the                            proximal descending colon, at the splenic flexure                            and from 50 to 60 cm proximal to the anus.                            Biopsied. Tattooed.                           - One 6 mm polyp in the proximal ascending colon,                            removed with a hot snare. Resected and retrieved.                           -  Stool in the transverse colon, in the ascending                            colon and in the cecum.                           - Diverticulosis in the sigmoid colon and in the                            descending colon.                           - Internal hemorrhoids. Recommendation:           - Advance diet as tolerated and full liquid diet.                           - Await pathology results.                           - Repeat colonoscopy for surveillance based on                            pathology results. Procedure Code(s):        --- Professional ---                           [phone], Colonoscopy, flexible; with removal of                            tumor(s), polyp(s), or other lesion(s) by snare                            technique                            45381, Colonoscopy, flexible; with directed                            submucosal injection(s), any substance                           45380, 59, Colonoscopy, flexible; with biopsy,                            single or multiple Diagnosis Code(s):        --- Professional ---                           R10.84, Generalized abdominal pain                           K63.5, Polyp of colon                           K63.89, Other specified diseases of intestine  K64.0, First degree hemorrhoids                           K52.9, Noninfective gastroenteritis and colitis,                            unspecified                           K57.30, Diverticulosis of large intestine without                            perforation or abscess without bleeding                           R93.3, Abnormal findings on diagnostic imaging of                            other parts of digestive tract CPT copyright 2019 American Medical Association. All rights reserved. The codes documented in this report are preliminary and upon coder review may  be revised to meet current compliance requirements. Shirley Friar, MD 11/29/2020 4:27:14 PM This report has been signed electronically. Number of Addenda: 0

## 2020-11-29 NOTE — Anesthesia Postprocedure Evaluation (Signed)
Anesthesia Post Note  Patient: Michelle Graves  Procedure(s) Performed: COLONOSCOPY (N/A ) POLYPECTOMY BIOPSY     Patient location during evaluation: Endoscopy Anesthesia Type: MAC Level of consciousness: awake and alert Pain management: pain level controlled Vital Signs Assessment: post-procedure vital signs reviewed and stable Respiratory status: spontaneous breathing, nonlabored ventilation and respiratory function stable Cardiovascular status: blood pressure returned to baseline and stable Postop Assessment: no apparent nausea or vomiting Anesthetic complications: no   No complications documented.  Last Vitals:  Vitals:   11/29/20 1337 11/29/20 1413  BP: (!) 168/97 (!) 174/99  Pulse:  81  Resp:  15  Temp:  37.1 C  SpO2:  99%    Last Pain:  Vitals:   11/29/20 1413  TempSrc: Oral  PainSc: 0-No pain                 Lidia Collum

## 2020-11-29 NOTE — Transfer of Care (Signed)
Immediate Anesthesia Transfer of Care Note  Patient: Michelle Graves  Procedure(s) Performed: COLONOSCOPY (N/A ) POLYPECTOMY BIOPSY  Patient Location: Endoscopy Unit  Anesthesia Type:MAC  Level of Consciousness: drowsy  Airway & Oxygen Therapy: Patient Spontanous Breathing and Patient connected to nasal cannula oxygen  Post-op Assessment: Report given to RN and Post -op Vital signs reviewed and stable  Post vital signs: Reviewed and stable  Last Vitals:  Vitals Value Taken Time  BP    Temp    Pulse    Resp    SpO2      Last Pain:  Vitals:   11/29/20 1413  TempSrc: Oral  PainSc: 0-No pain         Complications: No complications documented.

## 2020-11-29 NOTE — Interval H&P Note (Signed)
History and Physical Interval Note:  11/29/2020 2:57 PM  Michelle Graves  has presented today for surgery, with the diagnosis of colonic stricture.  The various methods of treatment have been discussed with the patient and family. After consideration of risks, benefits and other options for treatment, the patient has consented to  Procedure(s): COLONOSCOPY (N/A) as a surgical intervention.  The patient's history has been reviewed, patient examined, no change in status, stable for surgery.  I have reviewed the patient's chart and labs.  Questions were answered to the patient's satisfaction.     Shirley Friar

## 2020-11-29 NOTE — Progress Notes (Signed)
Patient back to room in NAD.  

## 2020-11-30 LAB — BASIC METABOLIC PANEL
Anion gap: 13 (ref 5–15)
BUN: 10 mg/dL (ref 8–23)
CO2: 22 mmol/L (ref 22–32)
Calcium: 9.5 mg/dL (ref 8.9–10.3)
Chloride: 103 mmol/L (ref 98–111)
Creatinine, Ser: 1.41 mg/dL — ABNORMAL HIGH (ref 0.44–1.00)
GFR, Estimated: 41 mL/min — ABNORMAL LOW (ref >60)
Glucose, Bld: 120 mg/dL — ABNORMAL HIGH (ref 70–99)
Potassium: 3.9 mmol/L (ref 3.5–5.1)
Sodium: 138 mmol/L (ref 135–145)

## 2020-11-30 LAB — CBC WITH DIFFERENTIAL/PLATELET
Abs Immature Granulocytes: 0.32 10*3/uL — ABNORMAL HIGH (ref 0.00–0.07)
Basophils Absolute: 0.1 10*3/uL (ref 0.0–0.1)
Basophils Relative: 1 %
Eosinophils Absolute: 0.1 10*3/uL (ref 0.0–0.5)
Eosinophils Relative: 1 %
HCT: 39.4 % (ref 36.0–46.0)
Hemoglobin: 13.5 g/dL (ref 12.0–15.0)
Immature Granulocytes: 3 %
Lymphocytes Relative: 18 %
Lymphs Abs: 2 10*3/uL (ref 0.7–4.0)
MCH: 31.5 pg (ref 26.0–34.0)
MCHC: 34.3 g/dL (ref 30.0–36.0)
MCV: 92.1 fL (ref 80.0–100.0)
Monocytes Absolute: 1.1 10*3/uL — ABNORMAL HIGH (ref 0.1–1.0)
Monocytes Relative: 10 %
Neutro Abs: 7.7 10*3/uL (ref 1.7–7.7)
Neutrophils Relative %: 67 %
Platelets: 250 10*3/uL (ref 150–400)
RBC: 4.28 MIL/uL (ref 3.87–5.11)
RDW: 13.2 % (ref 11.5–15.5)
WBC: 11.4 10*3/uL — ABNORMAL HIGH (ref 4.0–10.5)
nRBC: 0 % (ref 0.0–0.2)

## 2020-11-30 MED ORDER — CARVEDILOL 12.5 MG PO TABS
12.5000 mg | ORAL_TABLET | Freq: Two times a day (BID) | ORAL | Status: DC
  Administered 2020-12-01: 12.5 mg via ORAL
  Filled 2020-11-30: qty 1

## 2020-11-30 MED ORDER — PROMETHAZINE HCL 25 MG/ML IJ SOLN
12.5000 mg | Freq: Four times a day (QID) | INTRAMUSCULAR | Status: DC | PRN
  Administered 2020-11-30: 12.5 mg via INTRAVENOUS
  Filled 2020-11-30: qty 1

## 2020-11-30 MED ORDER — ACETAMINOPHEN 325 MG PO TABS
650.0000 mg | ORAL_TABLET | Freq: Four times a day (QID) | ORAL | Status: DC | PRN
  Administered 2020-11-30 – 2020-12-01 (×2): 650 mg via ORAL
  Filled 2020-11-30 (×2): qty 2

## 2020-11-30 MED ORDER — ONDANSETRON HCL 4 MG/2ML IJ SOLN
4.0000 mg | Freq: Four times a day (QID) | INTRAMUSCULAR | Status: DC | PRN
  Administered 2020-11-30: 4 mg via INTRAVENOUS
  Filled 2020-11-30: qty 2

## 2020-11-30 NOTE — Care Management Important Message (Signed)
Important Message  Patient Details  Name: Michelle Graves MRN: [id number] Date of Birth: April 10, 1951   Medicare Important Message Given:  Yes     Oralia Rud Ambrose Wile 11/30/2020, 2:34 PM

## 2020-11-30 NOTE — Progress Notes (Signed)
Pt started vomiting since 9pm. Provider paged to request IV zofran since patient was not able to tolerate PO zofran. Pt had no relief with IV zofran. Pt unable to take bedtime meds for high blood pressure due to actively vomiting. Provider paged again to request additional nausea meds. Provider ordered phenergan but unable to give due to patient losing IV access. Pt difficult stick, IV team was unable to get access until 5:50am.  Pt denies abdominal pain but sates she feels very "sick to her stomach" and nauseous. BP elevated, IV PRN meds given to control BP.

## 2020-11-30 NOTE — Progress Notes (Signed)
PROGRESS NOTE    Michelle Graves  OKH:[id number] DOB: 1951-09-30 DOA: 11/26/2020 PCP: [facility]   Chief Complain: Abdomen pain, nausea and vomiting.  Brief Narrative: Patient is a 70 year old female with history of rheumatoid arthritis, CKD stage IIIb, hypertension, diverticulitis with sequelae of colonic structure presented with abdomen pain, diarrhea.  She was hospitalized on last July 2021 for chronic obstruction at that time CT had shown nonobstructive stenosis of the splenic flexure.  Patient had developed abd pain and vomiting and went to [facility] on 11/18/2020 and was treated with enema and was released home.  She presented this time with 1 week history of constipation, abdominal pain, large watery diarrhea.  CT abdomen pelvis showed a stricture of the transverse colon splenic flexure and also 3 large chronic lamellated stool balls  proximal to the distal stricturing.  She was also found to have AKI on presentation.  GI consulted and she underwent   colonoscopy today.  11/30/2020: Patient seen.  Patient had nausea earlier, but now resolved.  Patient reports minimal abdominal pain.  Patient is not particularly good historian.  Assessment & Plan:   Active Problems:   AKI (acute kidney injury) (HCC)   Colonic stricture (HCC)   Abdominal pain/diarrhea: Presumed to be secondary to colonic stricture.  CT imagings as above.  Her abdominal pain and diarrhea have resolved.   She underwent colonoscopy today which showed long benign-appearing stricture in the splenic flexure and proximal descending colon, no mass seen in the strictured area, biopsies taken. GI recommended to follow-up with Dr. Doretha Imus in 3 to 4 weeks. If she develops abdominal pain or worsening constipation in the future, GI recommended for surgical evaluation as an outpatient for consideration of resection of the stricture. Continue supportive care, pain management. 11/30/2020: Diarrhea  has resolved.  Nausea and vomiting have resolved.  Patient continues to have pain.  We will continue to manage expectantly.  AKI on CKD stage IIIb: Baseline creatinine ranges from 1.3-1.8.  Creatinine on presentation was 2.3.  Creatinine has returned to baseline with IV fluids.  AKI is related to poor oral intake, hypovolemia.  She follows with [place] [facility].Fluids D/Ced 11/30/2020: Acute kidney injury has resolved significantly.  Severe hypertension: Metoprolol, Cardizem, minipress.  Her blood pressure remains elevated today, treated with several doses of labetalol IV.  Continue monitoring blood pressure.  We increased the dose of metoprolol and Minipress.  Continue current medications, continue as needed medications for severe hypertension. 11/30/2020: We will change metoprolol to Coreg 12.5 Mg p.o. twice daily.  Continue to monitor blood pressure closely.  Patient is on IV labetalol as needed.  Rheumatoid arthritis: On Plaquenil and prednisone  Leukocytosis: Resolved  Mood disorder: On Cymbalta  Obesity: BMI of 30  DVT prophylaxis: Code Status: Full Family Communication: None at bedside Status is: Inpatient  Remains inpatient appropriate because:Inpatient level of care appropriate due to severity of illness   Dispo: The patient is from: Home              Anticipated d/c is to: Home              Anticipated d/c date is: 1 day              Patient currently is not medically stable to d/c.   Difficult to place patient No Plan for discharge to home tomorrow.  Needs to be monitored overnight for severe hypertension.     Consultants: GI  Procedures:Colonoscopy  Antimicrobials:  Anti-infectives (From admission, onward)   Start     Dose/Rate Route Frequency Ordered Stop   11/26/20 2200  hydroxychloroquine (PLAQUENIL) tablet 200 mg        200 mg Oral 2 times daily 11/26/20 1712        Subjective:  Patient seen. Reports abdominal pain.  Patient had nausea earlier.   Patient  blood pressures is not controlled.  .   Objective: Vitals:   11/30/20 0034 11/30/20 0438 11/30/20 0600 11/30/20 0928  BP: (!) 168/118 (!) 179/122 (!) 183/115 (!) 187/109  Pulse: 93 98  94  Resp: 18 18  18   Temp: 99.3 F (37.4 C) 98.8 F (37.1 C)  98.3 F (36.8 C)  TempSrc: Oral Oral  Oral  SpO2: 98% 99%  100%  Weight:      Height:        Intake/Output Summary (Last 24 hours) at 11/30/2020 1216 Last data filed at 11/30/2020 0444 Gross per 24 hour  Intake 0 ml  Output 500 ml  Net -500 ml   Filed Weights   11/27/20 0400 11/29/20 1010 11/29/20 1413  Weight: 87.1 kg 86.2 kg 86.2 kg    Examination:  General exam: Appears calm and comfortable ,Not in distress,pleasant female HEENT:PERRL,Oral mucosa moist, Ear/Nose normal on gross exam Respiratory system: Bilateral equal air entry, normal vesicular breath sounds, no wheezes or crackles  Cardiovascular system: S1 & S2 heard, RRR.  Gastrointestinal system: Abdomen is nondistended, soft and nontender.Normal bowel sounds heard. Central nervous system: Alert and oriented. No focal neurological deficits. Extremities: No edema, no clubbing ,no cyanosis Skin: No rashes, lesions or ulcers,no icterus ,no pallor   Data Reviewed: I have personally reviewed following labs and imaging studies  CBC: Recent Labs  Lab 11/26/20 1130 11/27/20 2157 11/28/20 0323 11/30/20 0700  WBC 16.1* 8.3 8.4 11.4*  NEUTROABS  --   --   --  7.7  HGB 12.5 13.8 11.5* 13.5  HCT 39.8 43.6 35.2* 39.4  MCV 97.8 96.7 94.1 92.1  PLT 247 234 207 250   Basic Metabolic Panel: Recent Labs  Lab 11/26/20 1035 11/27/20 0230 11/28/20 0323 11/29/20 0120 11/30/20 0700  NA 134* 135 138 140 138  K 5.0 4.8 4.0 4.5 3.9  CL 99 99 102 105 103  CO2 24 26 23 24 22   GLUCOSE 127* 90 79 89 120*  BUN 50* 45* 27* 17 10  CREATININE 2.38* 2.35* 1.64* 1.47* 1.41*  CALCIUM 9.6 8.6* 8.2* 8.9 9.5   GFR: Estimated Creatinine Clearance: 43 mL/min (A) (by C-G formula based on  SCr of 1.41 mg/dL (H)). Liver Function Tests: Recent Labs  Lab 11/26/20 1035 11/27/20 0230  AST 17 15  ALT 15 15  ALKPHOS 87 96  BILITOT 0.8 0.9  PROT 6.8 6.6  ALBUMIN 3.6 3.4*   Recent Labs  Lab 11/26/20 1035  LIPASE 25   No results for input(s): AMMONIA in the last 168 hours. Coagulation Profile: No results for input(s): INR, PROTIME in the last 168 hours. Cardiac Enzymes: No results for input(s): CKTOTAL, CKMB, CKMBINDEX, TROPONINI in the last 168 hours. BNP (last 3 results) No results for input(s): PROBNP in the last 8760 hours. HbA1C: No results for input(s): HGBA1C in the last 72 hours. CBG: No results for input(s): GLUCAP in the last 168 hours. Lipid Profile: No results for input(s): CHOL, HDL, LDLCALC, TRIG, CHOLHDL, LDLDIRECT in the last 72 hours. Thyroid Function Tests: No results for input(s): TSH, T4TOTAL, FREET4, T3FREE, THYROIDAB in the last  72 hours. Anemia Panel: No results for input(s): VITAMINB12, FOLATE, FERRITIN, TIBC, IRON, RETICCTPCT in the last 72 hours. Sepsis Labs: No results for input(s): PROCALCITON, LATICACIDVEN in the last 168 hours.  Recent Results (from the past 240 hour(s))  SARS Coronavirus 2 by RT PCR (hospital order, performed in [facility] hospital lab) Nasopharyngeal Nasopharyngeal Swab     Status: None   Collection Time: 11/26/20 10:27 PM   Specimen: Nasopharyngeal Swab  Result Value Ref Range Status   SARS Coronavirus 2 NEGATIVE NEGATIVE Final    Comment: (NOTE) SARS-CoV-2 target nucleic acids are NOT DETECTED.  The SARS-CoV-2 RNA is generally detectable in upper and lower respiratory specimens during the acute phase of infection. The lowest concentration of SARS-CoV-2 viral copies this assay can detect is 250 copies / mL. A negative result does not preclude SARS-CoV-2 infection and should not be used as the sole basis for treatment or other patient management decisions.  A negative result may occur with improper specimen  collection / handling, submission of specimen other than nasopharyngeal swab, presence of viral mutation(s) within the areas targeted by this assay, and inadequate number of viral copies (<250 copies / mL). A negative result must be combined with clinical observations, patient history, and epidemiological information.  Fact Sheet for Patients:   BoilerBrush.com.cy  Fact Sheet for Healthcare Providers: https://pope.com/  This test is not yet approved or  cleared by the [place] FDA and has been authorized for detection and/or diagnosis of SARS-CoV-2 by FDA under an Emergency Use Authorization (EUA).  This EUA will remain in effect (meaning this test can be used) for the duration of the COVID-19 declaration under Section 564(b)(1) of the Act, 21 U.S.C. section 360bbb-3(b)(1), unless the authorization is terminated or revoked sooner.  Performed at [facility], [address] [place], [place] [address]          Radiology Studies: No results found.      Scheduled Meds: . darifenacin  7.5 mg Oral Daily  . diltiazem  180 mg Oral Daily  . docusate sodium  100 mg Oral BID  . doxepin  10 mg Oral Daily  . DULoxetine  60 mg Oral QHS  . hydroxychloroquine  200 mg Oral BID  . metoprolol tartrate  50 mg Oral BID  . mirabegron ER  25 mg Oral Daily  . pantoprazole  40 mg Oral Daily  . prazosin  2 mg Oral QHS  . predniSONE  5 mg Oral Daily  . QUEtiapine  25 mg Oral Daily   Continuous Infusions: . lactated ringers 500 mL/hr at 11/29/20 1614     LOS: 4 days    Time spent: 25 mins.More than 50% of that time was spent in counseling and/or coordination of care.      Barnetta Chapel, MD [facility] P2/01/2021, 12:16 PM

## 2020-11-30 NOTE — Progress Notes (Signed)
PT Cancellation Note  Patient Details Name: Michelle Graves MRN: [id number] DOB: 03-08-1951   Cancelled Treatment:    Reason Eval/Treat Not Completed: Medical issues which prohibited therapy pt has been nauseated all night and has requested to rest, PT will reattempt in PM time permitting. [name], DPT Acute Rehabilitation Services [phone]   [name] 11/30/2020, 11:24 AM

## 2020-12-01 MED ORDER — MYRBETRIQ 25 MG PO TB24: 25.0000 mg | ORAL_TABLET | Freq: Every day | ORAL | 0 refills | Status: DC

## 2020-12-01 MED ORDER — DOCUSATE SODIUM 100 MG PO CAPS: 100.0000 mg | ORAL_CAPSULE | Freq: Two times a day (BID) | ORAL | 0 refills | Status: DC

## 2020-12-01 MED ORDER — WHITE PETROLATUM EX OINT
TOPICAL_OINTMENT | CUTANEOUS | Status: AC
  Filled 2020-12-01: qty 28.35

## 2020-12-01 MED ORDER — DARIFENACIN HYDROBROMIDE ER 7.5 MG PO TB24: 7.5000 mg | ORAL_TABLET | Freq: Every day | ORAL | 0 refills | Status: DC

## 2020-12-01 MED ORDER — OXYCODONE HCL 5 MG PO TABS
5.0000 mg | ORAL_TABLET | Freq: Once | ORAL | Status: AC
  Administered 2020-12-01: 5 mg via ORAL
  Filled 2020-12-01: qty 1

## 2020-12-01 MED ORDER — PANTOPRAZOLE SODIUM 40 MG PO TBEC: 40.0000 mg | DELAYED_RELEASE_TABLET | Freq: Every day | ORAL | 0 refills | Status: DC

## 2020-12-01 MED ORDER — HYDRALAZINE HCL 20 MG/ML IJ SOLN
10.0000 mg | INTRAMUSCULAR | Status: DC | PRN
  Administered 2020-12-01: 10 mg via INTRAVENOUS
  Filled 2020-12-01: qty 1

## 2020-12-01 NOTE — Progress Notes (Signed)
Pt daughter at bedside. Will notify md daughter wanting update on pt plan of care

## 2020-12-01 NOTE — Progress Notes (Signed)
Physical Therapy Treatment Patient Details Name: Michelle Graves MRN: [id number] DOB: 24-Feb-1951 Today's Date: 12/01/2020    History of Present Illness Pt is a 70 y/o female admitted secondary to  1 week history of constipation and abdominal pain and large watery diarrhea.  CT abdomen And pelvis showed stricture of the transverse colon at the splenic flexure. PMH includes RA, CKD, and HTN.    PT Comments    Patient with reports of splitting headache that has been ongoing for 2 days, however agreeable to PT. Patient performed bed mobility with supervision. Upon sitting, patient reports headache worsened. BP reading 137/115 initially and with seated exercises increased to 141/118 with HR 115-125 with both assessments. Deferred OOB mobility this session as patient with slow processing and increased pain with movement. Patient able to lateral scoot towards [facility] with supervision. Continue to recommend HHPT following d/c.     Follow Up Recommendations  Home health PT;Supervision for mobility/OOB     Equipment Recommendations  None recommended by PT    Recommendations for Other Services       Precautions / Restrictions Precautions Precautions: Fall Precaution Comments: watch BP Restrictions Weight Bearing Restrictions: No    Mobility  Bed Mobility Overal bed mobility: Graves Assistance Bed Mobility: Supine to Sit;Sit to Supine     Supine to sit: Supervision Sit to supine: Supervision   General bed mobility comments: Supervision for safety with lines. Increased time required.  Transfers Overall transfer level: Graves assistance   Transfers: Lateral/Scoot Transfers          Lateral/Scoot Transfers: Supervision General transfer comment: supervision for lateral scooting towards HOB. Unable to perform OOB mobility this date as BP readings were 137/115 after supine>sit and 141/118 after seated exercises.  Ambulation/Gait             General Gait Details: deferred due to BP  readings   Stairs             Wheelchair Mobility    Modified Rankin (Stroke Patients Only)       Balance                                            Cognition Arousal/Alertness: Awake/alert Behavior During Therapy: WFL for tasks assessed/performed Overall Cognitive Status: No family/caregiver present to determine baseline cognitive functioning Area of Impairment: Safety/judgement                         Safety/Judgement: Decreased awareness of safety            Exercises General Exercises - Lower Extremity Long Arc Quad: Both;10 reps Hip Flexion/Marching: Both;10 reps;Seated    General Comments        Pertinent Vitals/Pain Pain Assessment: Faces Faces Pain Scale: Hurts even more Pain Location: head Pain Descriptors / Indicators: Grimacing;Pressure;Constant Pain Intervention(s): Monitored during session;Limited activity within patient's tolerance    Home Living                      Prior Function            PT Goals (current goals can now be found in the care plan section) Acute Rehab PT Goals Patient Stated Goal: feel better PT Goal Formulation: With patient Time For Goal Achievement: 12/11/20 Potential to Achieve Goals: Good Progress towards PT goals: Progressing toward goals  Frequency    Min 3X/week      PT Plan Current plan remains appropriate    Co-evaluation              AM-PAC PT "6 Clicks" Mobility   Outcome Measure  Help needed turning from your back to your side while in a flat bed without using bedrails?: None Help needed moving from lying on your back to sitting on the side of a flat bed without using bedrails?: A Little Help needed moving to and from a bed to a chair (including a wheelchair)?: A Little Help needed standing up from a chair using your arms (e.g., wheelchair or bedside chair)?: A Little Help needed to walk in hospital room?: A Little Help needed climbing 3-5  steps with a railing? : A Lot 6 Click Score: 18    End of Session   Activity Tolerance: Patient limited by pain;Other (comment) (BP) Patient left: in bed;with call bell/phone within reach;with bed alarm set Nurse Communication: Mobility status PT Visit Diagnosis: Unsteadiness on feet (R26.81);Muscle weakness (generalized) (M62.81)     Time: 9150-5697 PT Time Calculation (min) (ACUTE ONLY): 22 min  Charges:  $Therapeutic Activity: 8-22 mins                     Michelle Graves PT, DPT Acute Rehabilitation Services Pager [phone] Office [phone]    Elissa Lovett 12/01/2020, 9:48 AM

## 2020-12-01 NOTE — Progress Notes (Signed)
BP 197/127 -right arm, BP 154/129  -left arm . Paged Dr Loney Loh. Informed MD patient ready received 2 doses of labetolol and scheduled dose of minipress and metoprolol. MD ask me to recheck vital signs in one hour.

## 2020-12-01 NOTE — Progress Notes (Signed)
Recliner/BSC offered to pt, pt declined. This rn removed pt purwick, pt encouraged to use bsc and to call for asst when needed

## 2020-12-01 NOTE — Progress Notes (Signed)
02:11 185/126-left arm, 186/124-right arm. Text paged on call provider.  02:16 Call back received from MD to give hydralazine

## 2020-12-01 NOTE — Discharge Summary (Signed)
Physician Discharge Summary  Patient ID: ADEA GEISEL MRN: [id number] DOB/AGE: Feb 11, 1951 70 y.o.  Admit date: 11/26/2020 Discharge date: 12/01/2020  Admission Diagnoses:  Discharge Diagnoses:  Active Problems:   AKI (acute kidney injury) (HCC)   Colonic stricture [facility])   Discharged Condition: stable  Hospital Course:  Patient is a 69 year old female with history of rheumatoid arthritis, CKD stage IIIb, hypertension, diverticulitis with sequelae of colonic structure.  Patient presented with abdomen pain, nausea and vomiting.  Patient was hospitalized in July 2021 for chronic obstruction at that time and the CT-scan had shown nonobstructive stenosis of the splenic flexure.  Patient had developed abd pain and vomiting and went to [facility] on 11/18/2020 and was treated with enema and was released home.  She presented this time with 1 week history of constipation, abdominal pain, large watery diarrhea.  CT abdomen pelvis showed a stricture of the transverse colon splenic flexure and also 3 large chronic lamellated stool balls  proximal to the distal stricturing.  She was also found to have AKI on presentation.  GI consulted and she underwent colonoscopy.  Colonoscopy has continued to reveal colonic stricture: Proximal ascending colon sessile polyp that was removed and descending colon/sigmoid diverticulosis.  Biopsies have been taken, and the pathology is awaited.  Patient has slowly improved.  Patient will follow with gastroenterology team on discharge.  Patient's blood pressure was noted to be elevated during the hospital stay.  Blood pressure has been gradually optimized.  Primary care provider will kindly follow patient's blood pressure on discharge, and manage accordingly.  Abdominal pain/diarrhea:  -Presumed to be secondary to colonic stricture.   -CT imagings as documented. -Patient underwent colonoscopy.  Please see above documentation.  Colonoscopy revealed long benign-appearing  stricture in the splenic flexure and proximal descending colon, no mass seen in the strictured area, biopsies taken. -GI recommended to follow-up with Dr. Doretha Imus, GI physician, in 3 to 4 weeks. -If she develops abdominal pain or worsening constipation in the future, GI recommended surgical evaluation as an outpatient for consideration of resection of the stricture. -Continued supportive care, pain management.  AKI on CKD stage IIIb:  -Baseline creatinine ranges from 1.3-1.8.   -Creatinine on presentation was 2.3.   Creatinine has returned to baseline with IV fluids.   -AKI is related to poor oral intake, hypovolemia.  -Patient follows with [place] [facility].Fluids D/Ced   Severe hypertension:  -Gradually controlled during the hospital stay. -PCP to continue monitoring patient blood pressure on discharge.   Rheumatoid arthritis: On Plaquenil and prednisone  Leukocytosis: Resolved  Mood disorder: On Cymbalta  Obesity: BMI of 30  Consults: GI  Significant Diagnostic Studies: Colonoscopy revealed: - Preparation of the colon was unsatisfactory. - Congested, inflamed and scarred mucosa in the proximal descending colon, at the splenic flexure and from 50 to 60 cm proximal to the anus. Biopsied. Tattooed. - One 6 mm polyp in the proximal ascending colon, removed with a hot snare. Resected and retrieved. - Stool in the transverse colon, in the ascending colon and in the cecum. - Diverticulosis in the sigmoid colon and in the descending colon. - Internal hemorrhoids.  CT scan of the abdomen and pelvis without contrast revealed: 1. 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 Three large chronic lamellated stool balls proximal to this stricturing. Differential includes benign and malignant stricturing of the colon. 2. No lymphadenopathy.  Discharge Exam: Blood pressure (!) 140/93, pulse 100, temperature 98.7 F (37.1 C), temperature source Oral, resp.  rate 19, height 5\' 7"  (1.702 m), weight 86.2 kg, SpO2 98 %.   Disposition: Discharge disposition: 01-Home or Self Care  Discharge Instructions    Diet - low sodium heart healthy   Complete by: As directed    Increase activity slowly   Complete by: As directed      Allergies as of 12/01/2020   No Known Allergies     Medication List    STOP taking these medications   alum & mag hydroxide-simeth 400-400-40 MG/5ML suspension Commonly known as: Maalox Advanced Max St   doxepin 10 MG capsule Commonly known as: SINEQUAN   omeprazole 20 MG capsule Commonly known as: PRILOSEC Replaced by: pantoprazole 40 MG tablet   prazosin 1 MG capsule Commonly known as: MINIPRESS   solifenacin 10 MG tablet Commonly known as: VESICARE Replaced by: darifenacin 7.5 MG 24 hr tablet     TAKE these medications   aspirin 81 MG EC tablet Take 81 mg by mouth daily.   carvedilol 25 MG tablet Commonly known as: COREG Take 25 mg by mouth 2 (two) times daily.   cloNIDine 0.3 MG tablet Commonly known as: CATAPRES Take 0.3 mg by mouth 2 (two) times daily.   darifenacin 7.5 MG 24 hr tablet Commonly known as: ENABLEX Take 1 tablet (7.5 mg total) by mouth daily. Start taking on: December 02, 2020 Replaces: solifenacin 10 MG tablet   diltiazem 180 MG 24 hr capsule Commonly known as: CARDIZEM CD Take 1 capsule (180 mg total) by mouth daily.   docusate sodium 100 MG capsule Commonly known as: COLACE Take 1 capsule (100 mg total) by mouth 2 (two) times daily.   DULoxetine 60 MG capsule Commonly known as: CYMBALTA Take 60 mg by mouth at bedtime.   HYDROcodone-acetaminophen 5-325 MG tablet Commonly known as: NORCO/VICODIN Take 1 tablet by mouth 3 (three) times daily as needed for severe pain.   hydroxychloroquine 200 MG tablet Commonly known as: PLAQUENIL Take 200 mg by mouth daily.   isosorbide mononitrate 30 MG 24 hr tablet Commonly known as: IMDUR Take 30 mg by mouth daily.    lidocaine 2 % solution Commonly known as: XYLOCAINE Use as directed 15 mLs in the mouth or throat every 6 (six) hours as needed (for severe stomach pain not relieved with Maalox).   lisinopril 20 MG tablet Commonly known as: ZESTRIL Take 20 mg by mouth daily.   multivitamin with minerals Tabs tablet Take 1 tablet by mouth daily.   Myrbetriq 25 MG Tb24 tablet Generic drug: mirabegron ER Take 1 tablet (25 mg total) by mouth daily.   ondansetron 4 MG tablet Commonly known as: ZOFRAN Take 1 tablet (4 mg total) by mouth every 6 (six) hours as needed for nausea. What changed: reasons to take this   pantoprazole 40 MG tablet Commonly known as: PROTONIX Take 1 tablet (40 mg total) by mouth daily. Start taking on: December 02, 2020 Replaces: omeprazole 20 MG capsule   polyethylene glycol 17 g packet Commonly known as: MIRALAX / GLYCOLAX Take 17 g by mouth 2 (two) times daily. What changed:   when to take this  reasons to take this   predniSONE 5 MG tablet Commonly known as: DELTASONE Take 5 mg by mouth daily.   QUEtiapine 100 MG tablet Commonly known as: SEROQUEL Take 100 mg by mouth daily.        SignedFebruary 8, 2022 12/01/2020, 4:10 PM

## 2020-12-02 ENCOUNTER — Encounter (HOSPITAL_COMMUNITY): Payer: Self-pay | Admitting: Gastroenterology

## 2020-12-03 ENCOUNTER — Other Ambulatory Visit: Payer: Self-pay | Admitting: Internal Medicine

## 2020-12-04 LAB — SURGICAL PATHOLOGY

## 2020-12-08 ENCOUNTER — Inpatient Hospital Stay (HOSPITAL_COMMUNITY)
Admission: EM | Admit: 2020-12-08 | Discharge: 2020-12-11 | DRG: 683 | Disposition: A | Discharge status: Home Health | Payer: Medicare Other | Attending: Internal Medicine | Admitting: Internal Medicine

## 2020-12-08 ENCOUNTER — Emergency Department (HOSPITAL_COMMUNITY): Payer: Medicare Other

## 2020-12-08 ENCOUNTER — Encounter (HOSPITAL_COMMUNITY): Payer: Self-pay | Admitting: Emergency Medicine

## 2020-12-08 ENCOUNTER — Other Ambulatory Visit: Payer: Self-pay

## 2020-12-08 DIAGNOSIS — N189 Chronic kidney disease, unspecified: Secondary | ICD-10-CM | POA: Diagnosis present

## 2020-12-08 DIAGNOSIS — R339 Retention of urine, unspecified: Secondary | ICD-10-CM | POA: Diagnosis not present

## 2020-12-08 DIAGNOSIS — R531 Weakness: Secondary | ICD-10-CM | POA: Diagnosis present

## 2020-12-08 DIAGNOSIS — M069 Rheumatoid arthritis, unspecified: Secondary | ICD-10-CM | POA: Diagnosis present

## 2020-12-08 DIAGNOSIS — Z79899 Other long term (current) drug therapy: Secondary | ICD-10-CM | POA: Diagnosis not present

## 2020-12-08 DIAGNOSIS — N183 Chronic kidney disease, stage 3 unspecified: Secondary | ICD-10-CM | POA: Diagnosis not present

## 2020-12-08 DIAGNOSIS — Z7982 Long term (current) use of aspirin: Secondary | ICD-10-CM | POA: Diagnosis not present

## 2020-12-08 DIAGNOSIS — Z8249 Family history of ischemic heart disease and other diseases of the circulatory system: Secondary | ICD-10-CM | POA: Diagnosis not present

## 2020-12-08 DIAGNOSIS — N179 Acute kidney failure, unspecified: Principal | ICD-10-CM | POA: Diagnosis present

## 2020-12-08 DIAGNOSIS — Z7952 Long term (current) use of systemic steroids: Secondary | ICD-10-CM | POA: Diagnosis not present

## 2020-12-08 DIAGNOSIS — M05749 Rheumatoid arthritis with rheumatoid factor of unspecified hand without organ or systems involvement: Secondary | ICD-10-CM | POA: Diagnosis not present

## 2020-12-08 DIAGNOSIS — K56699 Other intestinal obstruction unspecified as to partial versus complete obstruction: Secondary | ICD-10-CM | POA: Diagnosis present

## 2020-12-08 DIAGNOSIS — Z8719 Personal history of other diseases of the digestive system: Secondary | ICD-10-CM

## 2020-12-08 DIAGNOSIS — D638 Anemia in other chronic diseases classified elsewhere: Secondary | ICD-10-CM | POA: Diagnosis present

## 2020-12-08 DIAGNOSIS — N390 Urinary tract infection, site not specified: Secondary | ICD-10-CM | POA: Diagnosis present

## 2020-12-08 DIAGNOSIS — F329 Major depressive disorder, single episode, unspecified: Secondary | ICD-10-CM | POA: Diagnosis present

## 2020-12-08 DIAGNOSIS — Z87440 Personal history of urinary (tract) infections: Secondary | ICD-10-CM

## 2020-12-08 DIAGNOSIS — K56609 Unspecified intestinal obstruction, unspecified as to partial versus complete obstruction: Secondary | ICD-10-CM | POA: Diagnosis present

## 2020-12-08 DIAGNOSIS — N139 Obstructive and reflux uropathy, unspecified: Secondary | ICD-10-CM | POA: Diagnosis present

## 2020-12-08 DIAGNOSIS — M1990 Unspecified osteoarthritis, unspecified site: Secondary | ICD-10-CM | POA: Diagnosis present

## 2020-12-08 DIAGNOSIS — Z8673 Personal history of transient ischemic attack (TIA), and cerebral infarction without residual deficits: Secondary | ICD-10-CM | POA: Diagnosis not present

## 2020-12-08 DIAGNOSIS — I129 Hypertensive chronic kidney disease with stage 1 through stage 4 chronic kidney disease, or unspecified chronic kidney disease: Secondary | ICD-10-CM | POA: Diagnosis present

## 2020-12-08 DIAGNOSIS — F39 Unspecified mood [affective] disorder: Secondary | ICD-10-CM | POA: Diagnosis present

## 2020-12-08 DIAGNOSIS — Z20822 Contact with and (suspected) exposure to covid-19: Secondary | ICD-10-CM | POA: Diagnosis present

## 2020-12-08 DIAGNOSIS — N1832 Chronic kidney disease, stage 3b: Secondary | ICD-10-CM | POA: Diagnosis present

## 2020-12-08 LAB — BASIC METABOLIC PANEL
Anion gap: 12 (ref 5–15)
BUN: 82 mg/dL — ABNORMAL HIGH (ref 8–23)
CO2: 23 mmol/L (ref 22–32)
Calcium: 9.5 mg/dL (ref 8.9–10.3)
Chloride: 103 mmol/L (ref 98–111)
Creatinine, Ser: 5.81 mg/dL — ABNORMAL HIGH (ref 0.44–1.00)
GFR, Estimated: 7 mL/min — ABNORMAL LOW (ref >60)
Glucose, Bld: 91 mg/dL (ref 70–99)
Potassium: 4.2 mmol/L (ref 3.5–5.1)
Sodium: 138 mmol/L (ref 135–145)

## 2020-12-08 LAB — URINALYSIS, ROUTINE W REFLEX MICROSCOPIC
Bilirubin Urine: NEGATIVE
Glucose, UA: NEGATIVE mg/dL
Hgb urine dipstick: NEGATIVE
Ketones, ur: NEGATIVE mg/dL
Leukocytes,Ua: NEGATIVE
Nitrite: NEGATIVE
Protein, ur: NEGATIVE mg/dL
Specific Gravity, Urine: >1.03 — ABNORMAL HIGH (ref 1.005–1.030)
pH: 5.5 (ref 5.0–8.0)

## 2020-12-08 LAB — CBC WITH DIFFERENTIAL/PLATELET
Abs Immature Granulocytes: 0.12 10*3/uL — ABNORMAL HIGH (ref 0.00–0.07)
Basophils Absolute: 0 10*3/uL (ref 0.0–0.1)
Basophils Relative: 0 %
Eosinophils Absolute: 0.2 10*3/uL (ref 0.0–0.5)
Eosinophils Relative: 2 %
HCT: 32.5 % — ABNORMAL LOW (ref 36.0–46.0)
Hemoglobin: 10.3 g/dL — ABNORMAL LOW (ref 12.0–15.0)
Immature Granulocytes: 1 %
Lymphocytes Relative: 24 %
Lymphs Abs: 2.5 10*3/uL (ref 0.7–4.0)
MCH: 30.3 pg (ref 26.0–34.0)
MCHC: 31.7 g/dL (ref 30.0–36.0)
MCV: 95.6 fL (ref 80.0–100.0)
Monocytes Absolute: 1 10*3/uL (ref 0.1–1.0)
Monocytes Relative: 9 %
Neutro Abs: 6.5 10*3/uL (ref 1.7–7.7)
Neutrophils Relative %: 64 %
Platelets: 169 10*3/uL (ref 150–400)
RBC: 3.4 MIL/uL — ABNORMAL LOW (ref 3.87–5.11)
RDW: 13.9 % (ref 11.5–15.5)
WBC: 10.3 10*3/uL (ref 4.0–10.5)
nRBC: 0 % (ref 0.0–0.2)

## 2020-12-08 LAB — SARS CORONAVIRUS 2 (TAT 6-24 HRS): SARS Coronavirus 2: NEGATIVE

## 2020-12-08 MED ORDER — ISOSORBIDE MONONITRATE ER 30 MG PO TB24
30.0000 mg | ORAL_TABLET | Freq: Every day | ORAL | Status: DC
Start: 2020-12-09 — End: 2020-12-11
  Administered 2020-12-09 – 2020-12-11 (×3): 30 mg via ORAL
  Filled 2020-12-08 (×3): qty 1

## 2020-12-08 MED ORDER — CALCIUM CARBONATE ANTACID 1250 MG/5ML PO SUSP: 500.0000 mg | Freq: Four times a day (QID) | ORAL | Status: DC | PRN

## 2020-12-08 MED ORDER — NEPRO/CARBSTEADY PO LIQD: 237.0000 mL | Freq: Three times a day (TID) | ORAL | Status: DC | PRN

## 2020-12-08 MED ORDER — DULOXETINE HCL 60 MG PO CPEP
60.0000 mg | ORAL_CAPSULE | Freq: Every day | ORAL | Status: DC
  Administered 2020-12-08 – 2020-12-10 (×3): 60 mg via ORAL
  Filled 2020-12-08 (×3): qty 1

## 2020-12-08 MED ORDER — SODIUM CHLORIDE 0.9 % IV BOLUS
1000.0000 mL | Freq: Once | INTRAVENOUS | Status: AC
  Administered 2020-12-08: 1000 mL via INTRAVENOUS

## 2020-12-08 MED ORDER — SODIUM CHLORIDE 0.9 % IV SOLN: INTRAVENOUS | Status: DC

## 2020-12-08 MED ORDER — LISINOPRIL 20 MG PO TABS: 20.0000 mg | ORAL_TABLET | Freq: Every day | ORAL | Status: DC

## 2020-12-08 MED ORDER — DOCUSATE SODIUM 100 MG PO CAPS
100.0000 mg | ORAL_CAPSULE | Freq: Two times a day (BID) | ORAL | Status: DC
  Administered 2020-12-08 – 2020-12-11 (×6): 100 mg via ORAL
  Filled 2020-12-08 (×6): qty 1

## 2020-12-08 MED ORDER — PREDNISONE 5 MG PO TABS
5.0000 mg | ORAL_TABLET | Freq: Every day | ORAL | Status: DC
  Administered 2020-12-09 – 2020-12-11 (×3): 5 mg via ORAL
  Filled 2020-12-08 (×3): qty 1

## 2020-12-08 MED ORDER — ADULT MULTIVITAMIN W/MINERALS CH
1.0000 | ORAL_TABLET | Freq: Every day | ORAL | Status: DC
  Administered 2020-12-09 – 2020-12-11 (×3): 1 via ORAL
  Filled 2020-12-08 (×3): qty 1

## 2020-12-08 MED ORDER — DILTIAZEM HCL ER COATED BEADS 180 MG PO CP24
180.0000 mg | ORAL_CAPSULE | Freq: Every day | ORAL | Status: DC
  Administered 2020-12-09 – 2020-12-11 (×3): 180 mg via ORAL
  Filled 2020-12-08 (×3): qty 1

## 2020-12-08 MED ORDER — LIDOCAINE VISCOUS HCL 2 % MT SOLN: 15.0000 mL | Freq: Four times a day (QID) | OROMUCOSAL | Status: DC | PRN

## 2020-12-08 MED ORDER — HYDROXYCHLOROQUINE SULFATE 200 MG PO TABS
200.0000 mg | ORAL_TABLET | Freq: Every day | ORAL | Status: DC
  Administered 2020-12-09 – 2020-12-11 (×3): 200 mg via ORAL
  Filled 2020-12-08 (×3): qty 1

## 2020-12-08 MED ORDER — HYDROXYZINE HCL 25 MG PO TABS: 25.0000 mg | ORAL_TABLET | Freq: Three times a day (TID) | ORAL | Status: DC | PRN

## 2020-12-08 MED ORDER — ACETAMINOPHEN 650 MG RE SUPP: 650.0000 mg | Freq: Four times a day (QID) | RECTAL | Status: DC | PRN

## 2020-12-08 MED ORDER — DOCUSATE SODIUM 283 MG RE ENEM: 1.0000 | ENEMA | RECTAL | Status: DC | PRN

## 2020-12-08 MED ORDER — HEPARIN SODIUM (PORCINE) 5000 UNIT/ML IJ SOLN
5000.0000 [IU] | Freq: Three times a day (TID) | INTRAMUSCULAR | Status: DC
  Administered 2020-12-09 – 2020-12-11 (×6): 5000 [IU] via SUBCUTANEOUS
  Filled 2020-12-08 (×6): qty 1

## 2020-12-08 MED ORDER — DEXTROSE IN LACTATED RINGERS 5 % IV SOLN: INTRAVENOUS | Status: DC

## 2020-12-08 MED ORDER — CLONIDINE HCL 0.3 MG PO TABS
0.3000 mg | ORAL_TABLET | Freq: Two times a day (BID) | ORAL | Status: DC
Start: 2020-12-08 — End: 2020-12-11
  Administered 2020-12-08 – 2020-12-11 (×6): 0.3 mg via ORAL
  Filled 2020-12-08 (×6): qty 1

## 2020-12-08 MED ORDER — CARVEDILOL 25 MG PO TABS
25.0000 mg | ORAL_TABLET | Freq: Two times a day (BID) | ORAL | Status: DC
Start: 2020-12-09 — End: 2020-12-11
  Administered 2020-12-09 – 2020-12-11 (×5): 25 mg via ORAL
  Filled 2020-12-08 (×5): qty 1

## 2020-12-08 MED ORDER — HYDROCODONE-ACETAMINOPHEN 5-325 MG PO TABS
1.0000 | ORAL_TABLET | Freq: Three times a day (TID) | ORAL | Status: DC | PRN
  Filled 2020-12-08: qty 1

## 2020-12-08 MED ORDER — SORBITOL 70 % SOLN: 30.0000 mL | Status: DC | PRN

## 2020-12-08 MED ORDER — CAMPHOR-MENTHOL 0.5-0.5 % EX LOTN: 1.0000 "application " | TOPICAL_LOTION | Freq: Three times a day (TID) | CUTANEOUS | Status: DC | PRN

## 2020-12-08 MED ORDER — PANTOPRAZOLE SODIUM 40 MG PO TBEC
40.0000 mg | DELAYED_RELEASE_TABLET | Freq: Every day | ORAL | Status: DC
Start: 2020-12-09 — End: 2020-12-11
  Administered 2020-12-09 – 2020-12-11 (×3): 40 mg via ORAL
  Filled 2020-12-08 (×3): qty 1

## 2020-12-08 MED ORDER — ASPIRIN EC 81 MG PO TBEC
81.0000 mg | DELAYED_RELEASE_TABLET | Freq: Every day | ORAL | Status: DC
  Administered 2020-12-09 – 2020-12-11 (×3): 81 mg via ORAL
  Filled 2020-12-08 (×3): qty 1

## 2020-12-08 MED ORDER — ZOLPIDEM TARTRATE 5 MG PO TABS
5.0000 mg | ORAL_TABLET | Freq: Every evening | ORAL | Status: DC | PRN
  Administered 2020-12-10: 5 mg via ORAL
  Filled 2020-12-08: qty 1

## 2020-12-08 MED ORDER — POLYETHYLENE GLYCOL 3350 17 G PO PACK: 17.0000 g | PACK | Freq: Every day | ORAL | Status: DC | PRN

## 2020-12-08 MED ORDER — ACETAMINOPHEN 325 MG PO TABS: 650.0000 mg | ORAL_TABLET | Freq: Four times a day (QID) | ORAL | Status: DC | PRN

## 2020-12-08 MED ORDER — MIRABEGRON ER 25 MG PO TB24
25.0000 mg | ORAL_TABLET | Freq: Every day | ORAL | Status: DC
  Administered 2020-12-09 – 2020-12-11 (×3): 25 mg via ORAL
  Filled 2020-12-08 (×3): qty 1

## 2020-12-08 MED ORDER — ONDANSETRON HCL 4 MG PO TABS: 4.0000 mg | ORAL_TABLET | Freq: Four times a day (QID) | ORAL | Status: DC | PRN

## 2020-12-08 MED ORDER — ONDANSETRON HCL 4 MG/2ML IJ SOLN: 4.0000 mg | Freq: Four times a day (QID) | INTRAMUSCULAR | Status: DC | PRN

## 2020-12-08 MED ORDER — QUETIAPINE FUMARATE 100 MG PO TABS
100.0000 mg | ORAL_TABLET | Freq: Every day | ORAL | Status: DC
  Administered 2020-12-09 – 2020-12-11 (×3): 100 mg via ORAL
  Filled 2020-12-08 (×3): qty 1

## 2020-12-08 NOTE — H&P (Signed)
History and Physical   Michelle Graves YPP:[id number] DOB: Apr 01, 1951 DOA: 12/08/2020  Referring MD/NP/PA: Dr. Hyacinth Meeker  PCP: [facility]   Patient coming from: Home  Chief Complaint: Weakness  HPI: Michelle Graves is a 70 y.o. female with medical history significant of recent diagnosis of colonic stricture, diverticular disease, rheumatoid arthritis, mood disorder hypertension, history of UTIs, history of CVA who is on ACE inhibitor.  Patient was discharged on 12/01/2020 after admission from January 31 with AKI on CKD 3..  At that time she dose of this colonic stricture with colonoscopy performed and patient did better.  She was discharged home but returned with decreased oral intake weakness and noted to have worsening creatinine.  Patient also was noted to have full bladder with evidence of obstructive uropathy.  Foley catheter inserted in the ER now.  Patient being admitted with acute on chronic kidney injury.  She has chronic kidney disease against stage IIIb.  She denied any diarrhea.  She is on ACE inhibitor as indicated which is being held.  No fever no chills no nausea vomiting or diarrhea.  Patient will be admitted to the hospital for further evaluation and treatment...  ED Course: Temperature 98.6 blood pressure 90/77 pulse 71 respirate of 27 oxygen sats 95% on room air.  Urinalysis negative.  BUN is 82 and creatinine 5.81.  Discharge creatinine 124 was 1.4.  Hemoglobin 10.3 otherwise chemistry and CBC within normal.  COVID-19 screen is negative.  Abdominal x-ray showed moderate stool burden otherwise no acute findings.  Renal ultrasound showed distended bladder otherwise no kidney stones.  Patient being admitted to the hospital with acute on chronic kidney injury probably secondary to obstructive uropathy as well as some medical renal disease  Review of Systems: As per HPI otherwise 10 point review of systems negative.    Past Medical History:  Diagnosis  Date  . Acute on chronic kidney failure (HCC)   . Anemia   . Arthritis   . Chest pain   . Diverticulitis   . Hypertension   . Stroke (HCC)   . UTI (urinary tract infection) 10/2019    Past Surgical History:  Procedure Laterality Date  . BIOPSY  04/01/2019   Procedure: BIOPSY;  Surgeon: Kathi Der, MD;  Location: [facility] ENDOSCOPY;  Service: Gastroenterology;;  . BIOPSY  10/05/2019   Procedure: BIOPSY;  Surgeon: Kathi Der, MD;  Location: [facility] ENDOSCOPY;  Service: Gastroenterology;;  . BIOPSY  05/10/2020   Procedure: BIOPSY;  Surgeon: Kathi Der, MD;  Location: [facility] ENDOSCOPY;  Service: Gastroenterology;;  . BIOPSY  11/29/2020   Procedure: BIOPSY;  Surgeon: Charlott Rakes, MD;  Location: [facility] ENDOSCOPY;  Service: Gastroenterology;;  . COLONOSCOPY N/A 11/29/2020   Procedure: COLONOSCOPY;  Surgeon: Charlott Rakes, MD;  Location: [facility] ENDOSCOPY;  Service: Gastroenterology;  Laterality: N/A;  . COLONOSCOPY WITH PROPOFOL N/A 04/01/2019   Procedure: COLONOSCOPY WITH PROPOFOL;  Surgeon: Kathi Der, MD;  Location: [facility] ENDOSCOPY;  Service: Gastroenterology;  Laterality: N/A;  . COLONOSCOPY WITH PROPOFOL N/A 10/05/2019   Procedure: COLONOSCOPY WITH PROPOFOL;  Surgeon: Kathi Der, MD;  Location: [facility] ENDOSCOPY;  Service: Gastroenterology;  Laterality: N/A;  . COLONOSCOPY WITH PROPOFOL N/A 05/10/2020   Procedure: COLONOSCOPY WITH PROPOFOL;  Surgeon: Kathi Der, MD;  Location: [facility] ENDOSCOPY;  Service: Gastroenterology;  Laterality: N/A;  . ESOPHAGOGASTRODUODENOSCOPY (EGD) WITH PROPOFOL N/A 04/01/2019   Procedure: ESOPHAGOGASTRODUODENOSCOPY (EGD) WITH PROPOFOL;  Surgeon: Kathi Der, MD;  Location: [facility] ENDOSCOPY;  Service: Gastroenterology;  Laterality: N/A;  .  ESOPHAGOGASTRODUODENOSCOPY (EGD) WITH PROPOFOL N/A 10/05/2019   Procedure: ESOPHAGOGASTRODUODENOSCOPY (EGD) WITH PROPOFOL;  Surgeon: Kathi Der, MD;  Location: [facility] ENDOSCOPY;  Service: Gastroenterology;  Laterality:  N/A;  . POLYPECTOMY  11/29/2020   Procedure: POLYPECTOMY;  Surgeon: Charlott Rakes, MD;  Location: [facility] ENDOSCOPY;  Service: Gastroenterology;;  . Sunnie Nielsen TATTOO INJECTION  11/29/2020   Procedure: SUBMUCOSAL TATTOO INJECTION;  Surgeon: Charlott Rakes, MD;  Location: [facility] ENDOSCOPY;  Service: Gastroenterology;;  . TONSILLECTOMY       reports that she has never smoked. She has never used smokeless tobacco. She reports current alcohol use of about 21.0 standard drinks of alcohol per week. She reports that she does not use drugs.  No Known Allergies  Family History  Problem Relation Age of Onset  . Hypertension Mother   . Diabetes Mother   . CAD Father        died of MI at age 77  . Hypertension Father   . Diabetes Sister   . Diabetes Sister   . Kidney disease Neg Hx      Prior to Admission medications   Medication Sig Start Date End Date Taking? Authorizing Provider  aspirin 81 MG EC tablet Take 81 mg by mouth daily. 06/14/20 06/14/21 Yes [provider]  carvedilol (COREG) 25 MG tablet Take 25 mg by mouth 2 (two) times daily. 11/13/20  Yes [provider]  cloNIDine (CATAPRES) 0.3 MG tablet Take 0.3 mg by mouth 2 (two) times daily. 09/26/20  Yes [provider]  diltiazem (CARDIZEM CD) 180 MG 24 hr capsule Take 1 capsule (180 mg total) by mouth daily. 05/11/20  Yes Glade Lloyd, MD  docusate sodium (COLACE) 100 MG capsule Take 1 capsule (100 mg total) by mouth 2 (two) times daily. 12/01/20  Yes Berton Mount I, MD  DULoxetine (CYMBALTA) 60 MG capsule Take 60 mg by mouth at bedtime.    Yes [provider]  HYDROcodone-acetaminophen (NORCO/VICODIN) 5-325 MG tablet Take 1 tablet by mouth 3 (three) times daily as needed for severe pain. 11/05/20  Yes [provider]  hydroxychloroquine (PLAQUENIL) 200 MG tablet Take 200 mg by mouth daily. 06/16/16  Yes [provider]  isosorbide mononitrate (IMDUR) 30 MG 24 hr tablet Take 30 mg by  mouth daily. 11/15/20  Yes [provider]  lidocaine (XYLOCAINE) 2 % solution Use as directed 15 mLs in the mouth or throat every 6 (six) hours as needed (for severe stomach pain not relieved with Maalox). 11/22/20  Yes Cardama, Amadeo Garnet, MD  lisinopril (ZESTRIL) 20 MG tablet Take 20 mg by mouth daily. 09/16/20  Yes [provider]  Multiple Vitamin (MULTIVITAMIN WITH MINERALS) TABS tablet Take 1 tablet by mouth daily. 06/03/19  Yes Shahmehdi, Seyed A, MD  MYRBETRIQ 25 MG TB24 tablet Take 1 tablet (25 mg total) by mouth daily. 12/01/20  Yes Berton Mount I, MD  pantoprazole (PROTONIX) 40 MG tablet Take 1 tablet (40 mg total) by mouth daily. 12/02/20  Yes Berton Mount I, MD  polyethylene glycol (MIRALAX / GLYCOLAX) 17 g packet Take 17 g by mouth 2 (two) times daily. Patient taking differently: Take 17 g by mouth daily as needed for mild constipation. 05/11/20  Yes Glade Lloyd, MD  predniSONE (DELTASONE) 5 MG tablet Take 5 mg by mouth daily. 05/02/20  Yes [provider]  QUEtiapine (SEROQUEL) 100 MG tablet Take 100 mg by mouth daily. 05/02/20  Yes [provider]  darifenacin (ENABLEX) 7.5 MG 24 hr tablet Take 1 tablet (  7.5 mg total) by mouth daily. Patient not taking: No sig reported 12/02/20   Barnetta Chapel, MD  ondansetron (ZOFRAN) 4 MG tablet Take 1 tablet (4 mg total) by mouth every 6 (six) hours as needed for nausea. Patient not taking: Reported on 12/08/2020 04/04/19   Glade Lloyd, MD    Physical Exam: Vitals:   12/08/20 1900 12/08/20 1918 12/08/20 1930 12/08/20 2042  BP: 129/85  124/77 (!) 149/86  Pulse: 66  66 70  Resp: Temp:  98.5 F (36.9 C)  97.8 F (36.6 C)  TempSrc:  Oral  Oral  SpO2: 98%  98% 100%  Weight:    78.5 kg      Constitutional: NAD, calm, comfortable Vitals:   12/08/20 1900 12/08/20 1918 12/08/20 1930 12/08/20 2042  BP: 129/85  124/77 (!) 149/86  Pulse: 66  66 70  Resp: Temp:  98.5 F  (36.9 C)  97.8 F (36.6 C)  TempSrc:  Oral  Oral  SpO2: 98%  98% 100%  Weight:    78.5 kg   Eyes: PERRL, lids and conjunctivae normal ENMT: Mucous membranes are dry. Posterior pharynx clear of any exudate or lesions.Normal dentition.  Neck: normal, supple, no masses, no thyromegaly Respiratory: clear to auscultation bilaterally, no wheezing, no crackles. Normal respiratory effort. No accessory muscle use.  Cardiovascular: Regular rate and rhythm, no murmurs / rubs / gallops. No extremity edema. 2+ pedal pulses. No carotid bruits.  Abdomen: Distended in the suprapubic area no tenderness, no masses palpated. No hepatosplenomegaly. Bowel sounds positive.  Musculoskeletal: no clubbing / cyanosis. No joint deformity upper and lower extremities. Good ROM, no contractures. Normal muscle tone.  Skin: no rashes, lesions, ulcers. No induration Neurologic: CN 2-12 grossly intact. Sensation intact, DTR normal. Strength 5/5 in all 4.  Psychiatric: Slightly confused normal mood.     Labs on Admission: I have personally reviewed following labs and imaging studies  CBC: Recent Labs  Lab 12/08/20 1555  WBC 10.3  NEUTROABS 6.5  HGB 10.3*  HCT 32.5*  MCV 95.6  PLT 169   Basic Metabolic Panel: Recent Labs  Lab 12/08/20 1555  NA 138  K 4.2  CL 103  CO2 23  GLUCOSE 91  BUN 82*  CREATININE 5.81*  CALCIUM 9.5   GFR: Estimated Creatinine Clearance: 10 mL/min (A) (by C-G formula based on SCr of 5.81 mg/dL (H)). Liver Function Tests: No results for input(s): AST, ALT, ALKPHOS, BILITOT, PROT, ALBUMIN in the last 168 hours. No results for input(s): LIPASE, AMYLASE in the last 168 hours. No results for input(s): AMMONIA in the last 168 hours. Coagulation Profile: No results for input(s): INR, PROTIME in the last 168 hours. Cardiac Enzymes: No results for input(s): CKTOTAL, CKMB, CKMBINDEX, TROPONINI in the last 168 hours. BNP (last 3 results) No results for input(s): PROBNP in the last  8760 hours. HbA1C: No results for input(s): HGBA1C in the last 72 hours. CBG: No results for input(s): GLUCAP in the last 168 hours. Lipid Profile: No results for input(s): CHOL, HDL, LDLCALC, TRIG, CHOLHDL, LDLDIRECT in the last 72 hours. Thyroid Function Tests: No results for input(s): TSH, T4TOTAL, FREET4, T3FREE, THYROIDAB in the last 72 hours. Anemia Panel: No results for input(s): VITAMINB12, FOLATE, FERRITIN, TIBC, IRON, RETICCTPCT in the last 72 hours. Urine analysis:    Component Value Date/Time   COLORURINE YELLOW 12/08/2020 1859   APPEARANCEUR CLEAR 12/08/2020 1859   LABSPEC >1.030 (H) 12/08/2020 1859  PHURINE 5.5 12/08/2020 1859   GLUCOSEU NEGATIVE 12/08/2020 1859   HGBUR NEGATIVE 12/08/2020 1859   BILIRUBINUR NEGATIVE 12/08/2020 1859   KETONESUR NEGATIVE 12/08/2020 1859   PROTEINUR NEGATIVE 12/08/2020 1859   UROBILINOGEN 1.0 06/24/2010 1850   NITRITE NEGATIVE 12/08/2020 1859   LEUKOCYTESUR NEGATIVE 12/08/2020 1859   Sepsis Labs: @LABRCNTIP (procalcitonin:4,lacticidven:4) ) Recent Results (from the past 240 hour(s))  SARS CORONAVIRUS 2 (TAT 6-24 HRS) Nasopharyngeal Nasopharyngeal Swab     Status: None   Collection Time: 12/08/20  4:03 PM   Specimen: Nasopharyngeal Swab  Result Value Ref Range Status   SARS Coronavirus 2 NEGATIVE NEGATIVE Final    Comment: (NOTE) SARS-CoV-2 target nucleic acids are NOT DETECTED.  The SARS-CoV-2 RNA is generally detectable in upper and lower respiratory specimens during the acute phase of infection. Negative results do not preclude SARS-CoV-2 infection, do not rule out co-infections with other pathogens, and should not be used as the sole basis for treatment or other patient management decisions. Negative results must be combined with clinical observations, patient history, and epidemiological information. The expected result is Negative.  Fact Sheet for Patients: 02/05/21  Fact Sheet for  Healthcare Providers: HairSlick.no  This test is not yet approved or cleared by the quierodirigir.com FDA and  has been authorized for detection and/or diagnosis of SARS-CoV-2 by FDA under an Emergency Use Authorization (EUA). This EUA will remain  in effect (meaning this test can be used) for the duration of the COVID-19 declaration under Se ction 564(b)(1) of the Act, 21 U.S.C. section 360bbb-3(b)(1), unless the authorization is terminated or revoked sooner.  Performed at [facility], [address], [place], [place]      Radiological Exams on Admission: 62130 RENAL  Result Date: 12/08/2020 CLINICAL DATA:  Renal failure. EXAM: RENAL / URINARY TRACT ULTRASOUND COMPLETE COMPARISON:  None. FINDINGS: Right Kidney: Renal measurements: 8.5 x 4.5 x 4.7 cm = volume: 92.77 mL. Increased parenchymal echogenicity. Cortical thinning with some lobulation of the contour. No mass. No stone or hydronephrosis. Left Kidney: Renal measurements: 8.2 x 4.5 x 4.2 cm = volume: 82.1 mL. Increased parenchymal echogenicity. Renal cortical thinning. Lobulated contour. No mass, stone or hydronephrosis. Bladder: Distended.  Otherwise unremarkable. Other: None. IMPRESSION: 1. Findings consistent with medical renal disease with reduced renal sizes, increased renal parenchymal echogenicity and renal cortical thinning. No masses, stones or hydronephrosis. 2. Distended bladder. Electronically Signed   By: 02/05/2021 M.D.   On: 12/08/2020 17:07   DG Abdomen Acute W/Chest  Result Date: 12/08/2020 CLINICAL DATA:  Constipation. EXAM: DG ABDOMEN ACUTE WITH 1 VIEW CHEST COMPARISON:  CT scan 11/26/2020 FINDINGS: The upright chest x-ray demonstrates normal heart size and mild tortuosity of the thoracic aorta. No acute pulmonary findings. No pleural effusion. No pulmonary lesions. Two views of the abdomen demonstrate the moderate amount of stool in the right and proximal transverse colon. I  do not see any significant stool in the left colon, sigmoid colon or rectum. No distended small bowel loops to suggest obstruction. The soft tissue shadows are grossly maintained. No worrisome calcifications. No significant bony findings. IMPRESSION: 1. No acute cardiopulmonary findings. 2. Moderate stool in the right and proximal transverse colon. Electronically Signed   By: 11/28/2020 M.D.   On: 12/08/2020 16:57     Assessment/Plan Principal Problem:   Acute on chronic kidney failure [facility]) Active Problems:   Rheumatoid arthritis (HCC)   Weakness   Urinary retention   Bowel obstruction (HCC)     #  1 acute on chronic kidney injury stage III: Patient will be admitted to the hospital and monitor closely.  Hydrate patient while leaving Foley catheter in place.  Patient may require follow-up with urology for the obstructive uropathy.  #2 obstructive uropathy: Patient to follow-up again with urology.  Symptoms seem to be relieved with Foley catheter at this point.  May start Flomax prior to discharge.  #3 colonic stricture: Patient discharged to follow-up with general surgery for further evaluation.  If this becomes an issue we will have surgical consultation prior to discharge.  #4 history of rheumatoidarthritis: Continue home regimen  #5 generalized weakness: PT OT consultation   DVT prophylaxis: Heparin Code Status: Full code Family Communication: No family at bedside Disposition Plan: Home Consults called: None Admission status: Inpatient  Severity of Illness: The appropriate patient status for this patient is INPATIENT. Inpatient status is judged to be reasonable and necessary in order to provide the required intensity of service to ensure the patient's safety. The patient's presenting symptoms, physical exam findings, and initial radiographic and laboratory data in the context of their chronic comorbidities is felt to place them at high risk for further clinical deterioration.  Furthermore, it is not anticipated that the patient will be medically stable for discharge from the hospital within 2 midnights of admission. The following factors support the patient status of inpatient.   " The patient's presenting symptoms include weakness. " The worrisome physical exam findings include distended abdomen. " The initial radiographic and laboratory data are worrisome because of creatinine of more than 5. " The chronic co-morbidities include hypertension.   * I certify that at the point of admission it is my clinical judgment that the patient will require inpatient hospital care spanning beyond 2 midnights from the point of admission due to high intensity of service, high risk for further deterioration and high frequency of surveillance required.Lonia Blood MD [facility] Pager [phone]  If 7PM-7AM, please contact night-coverage [email or web address] Password [facility]  12/08/2020, 11:22 PM

## 2020-12-08 NOTE — ED Provider Notes (Signed)
This patient was accepted at change of shift, elderly 70 year old female presenting from home, she unfortunately was recently diagnosed with a colonic stricture and had been admitted to the hospital, there was some dehydration and an acute kidney injury.  She presented back to her doctor's office and was found to have worsening renal function.  She tells me that she has not urinated in approximately 2 days, she has felt some lower abdominal discomfort and is frustrated by not being able to urinate but is not in severe pain.  On my exam she has no tachycardia, her abdomen is soft but is very clearly able to palpate a mass in the lower abdomen which correlates with a distended urinary bladder on renal ultrasound.  There is no obvious hydronephrosis but there does appear to be some deterioration of the bilateral kidneys.  Renal function rechecked, creatinine is approximately 5.8, BUN is over 80, she has an acute kidney injury possibly related to a combination of dehydration, urinary retention and of note the patient is on an ACE inhibitor which is not helping.  I will place a Foley catheter to drain the obstruction, the patient will need to be admitted to the hospitalist service.  We will start some IV fluids.  I discussed the case with Dr. Mikeal Hawthorne who will admit  Final diagnoses:  AKI (acute kidney injury) (HCC)  Obstructive uropathy      Eber Hong, MD 12/08/20 [phone]

## 2020-12-08 NOTE — ED Triage Notes (Signed)
Pt arrives to ER via [facility] from home, she called ems due to her doctor calling to inform her of having elevated kidney function. Pt also has has not had a BM since 1/5 when she was discharged from the hospital. She was admitted with colonic stricture.

## 2020-12-08 NOTE — ED Notes (Signed)
Patient transported to X-ray 

## 2020-12-08 NOTE — ED Provider Notes (Signed)
[facility] EMERGENCY DEPARTMENT Provider Note   CSN: [id number] Arrival date & time: 12/08/20  1152     History Chief Complaint  Patient presents with  . Abnormal Lab    Michelle Graves is a 70 y.o. female.  Pt presents to the ED today with abnormal Cr.  Pt was admitted from 1/31-2/5 with n/v and abdominal pain.  She had a colonoscopy by Dr. Doretha Imus while in the hospital which showed a colonic stricture.  Pt did have a bowel movement while in the hospital, so she was d/c home with outpatient f/u.  Pt also had some acute on chronic renal disease.  Highest Cr was 2.3  Pt normally has Cr between 1.3 and 1.8.  She has not had a bowel movement since d/c.  She saw her pcp yesterday who did follow up labs. BUN 80 and Cr 6.62.  Her K was 5.6.  Pt denies any f/c.  She has not had an appetite and feels weak.         Past Medical History:  Diagnosis Date  . Acute on chronic kidney failure (HCC)   . Anemia   . Arthritis   . Chest pain   . Diverticulitis   . Hypertension   . Stroke (HCC)   . UTI (urinary tract infection) 10/2019    Patient Active Problem List   Diagnosis Date Noted  . 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 Bowel obstruction (HCC) 05/06/2020  . SIRS (systemic inflammatory response syndrome) (HCC) 05/06/2020  . UTI (urinary tract infection) 11/08/2019  . Major depressive disorder, recurrent episode, moderate (HCC) 11/08/2019  . Urinary retention 10/28/2019  . Acute on chronic kidney failure (HCC) 10/27/2019  . Malnutrition of moderate degree 10/03/2019  . Colitis 09/30/2019  . AKI (acute kidney injury) (HCC) 05/28/2019  . Nausea & vomiting 05/28/2019  . 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 Anemia, chronic disease 05/28/2019  . Swelling   . Small vessel disease, cerebrovascular   . Acute ischemic stroke (HCC)   . Weakness   . Acute left ankle pain   . Acute on chronic renal failure (HCC) 05/13/2019  . Weakness of both lower extremities  05/13/2019  . Cerebral thrombosis with cerebral infarction 03/31/2019  . New onset a-fib (HCC) 03/27/2019  . Acute kidney injury superimposed on CKD (HCC) 03/24/2019  . Hypotension 03/24/2019  . Hematemesis 03/24/2019  . Dark stools 03/24/2019  . Diverticulitis of intestine without perforation or abscess without bleeding   . Sinus tachycardia 08/27/2016  . Diverticulitis 08/26/2016  . HYPERTENSION, MALIGNANT ESSENTIAL 06/24/2007  . KIDNEY DISEASE, CHRONIC, STAGE III 06/24/2007  . ARTHRITIS, RHEUMATOID, SEROPOSITIVE 06/24/2007    Past Surgical History:  Procedure Laterality Date  . BIOPSY  04/01/2019   Procedure: BIOPSY;  Surgeon: Kathi Der, MD;  Location: [facility] ENDOSCOPY;  Service: Gastroenterology;;  . BIOPSY  10/05/2019   Procedure: BIOPSY;  Surgeon: Kathi Der, MD;  Location: [facility] ENDOSCOPY;  Service: Gastroenterology;;  . BIOPSY  05/10/2020   Procedure: BIOPSY;  Surgeon: Kathi Der, MD;  Location: [facility] ENDOSCOPY;  Service: Gastroenterology;;  . BIOPSY  11/29/2020   Procedure: BIOPSY;  Surgeon: Charlott Rakes, MD;  Location: [facility] ENDOSCOPY;  Service: Gastroenterology;;  . COLONOSCOPY N/A 11/29/2020   Procedure: COLONOSCOPY;  Surgeon: Charlott Rakes, MD;  Location: [facility] ENDOSCOPY;  Service: Gastroenterology;  Laterality: N/A;  . COLONOSCOPY WITH PROPOFOL N/A 04/01/2019   Procedure: COLONOSCOPY WITH PROPOFOL;  Surgeon: Kathi Der, MD;  Location: [facility] ENDOSCOPY;  Service: Gastroenterology;  Laterality:  N/A;  . COLONOSCOPY WITH PROPOFOL N/A 10/05/2019   Procedure: COLONOSCOPY WITH PROPOFOL;  Surgeon: Kathi Der, MD;  Location: [facility] ENDOSCOPY;  Service: Gastroenterology;  Laterality: N/A;  . COLONOSCOPY WITH PROPOFOL N/A 05/10/2020   Procedure: COLONOSCOPY WITH PROPOFOL;  Surgeon: Kathi Der, MD;  Location: [facility] ENDOSCOPY;  Service: Gastroenterology;  Laterality: N/A;  . ESOPHAGOGASTRODUODENOSCOPY (EGD) WITH PROPOFOL N/A 04/01/2019   Procedure:  ESOPHAGOGASTRODUODENOSCOPY (EGD) WITH PROPOFOL;  Surgeon: Kathi Der, MD;  Location: [facility] ENDOSCOPY;  Service: Gastroenterology;  Laterality: N/A;  . ESOPHAGOGASTRODUODENOSCOPY (EGD) WITH PROPOFOL N/A 10/05/2019   Procedure: ESOPHAGOGASTRODUODENOSCOPY (EGD) WITH PROPOFOL;  Surgeon: Kathi Der, MD;  Location: [facility] ENDOSCOPY;  Service: Gastroenterology;  Laterality: N/A;  . POLYPECTOMY  11/29/2020   Procedure: POLYPECTOMY;  Surgeon: Charlott Rakes, MD;  Location: [facility] ENDOSCOPY;  Service: Gastroenterology;;  . Sunnie Nielsen TATTOO INJECTION  11/29/2020   Procedure: SUBMUCOSAL TATTOO INJECTION;  Surgeon: Charlott Rakes, MD;  Location: [facility] ENDOSCOPY;  Service: Gastroenterology;;  . TONSILLECTOMY       OB History   No obstetric history on file.     Family History  Problem Relation Age of Onset  . Hypertension Mother   . Diabetes Mother   . CAD Father        died of MI at age 81  . Hypertension Father   . Diabetes Sister   . Diabetes Sister   . Kidney disease Neg Hx     Social History   Tobacco Use  . Smoking status: Never Smoker  . Smokeless tobacco: Never Used  Vaping Use  . Vaping Use: Never used  Substance Use Topics  . Alcohol use: Yes    Alcohol/week: 21.0 standard drinks    Types: 21 Glasses of wine per week    Comment: sometimes   . Drug use: No    Home Medications Prior to Admission medications   Medication Sig Start Date End Date Taking? Authorizing Provider  aspirin 81 MG EC tablet Take 81 mg by mouth daily. 06/14/20 06/14/21 Yes [provider]  carvedilol (COREG) 25 MG tablet Take 25 mg by mouth 2 (two) times daily. 11/13/20  Yes [provider]  cloNIDine (CATAPRES) 0.3 MG tablet Take 0.3 mg by mouth 2 (two) times daily. 09/26/20  Yes [provider]  diltiazem (CARDIZEM CD) 180 MG 24 hr capsule Take 1 capsule (180 mg total) by mouth daily. 05/11/20  Yes Glade Lloyd, MD  docusate sodium (COLACE) 100 MG capsule Take 1 capsule (100  mg total) by mouth 2 (two) times daily. 12/01/20  Yes Berton Mount I, MD  DULoxetine (CYMBALTA) 60 MG capsule Take 60 mg by mouth at bedtime.    Yes [provider]  HYDROcodone-acetaminophen (NORCO/VICODIN) 5-325 MG tablet Take 1 tablet by mouth 3 (three) times daily as needed for severe pain. 11/05/20  Yes [provider]  hydroxychloroquine (PLAQUENIL) 200 MG tablet Take 200 mg by mouth daily. 06/16/16  Yes [provider]  isosorbide mononitrate (IMDUR) 30 MG 24 hr tablet Take 30 mg by mouth daily. 11/15/20  Yes [provider]  lidocaine (XYLOCAINE) 2 % solution Use as directed 15 mLs in the mouth or throat every 6 (six) hours as needed (for severe stomach pain not relieved with Maalox). 11/22/20  Yes Cardama, Amadeo Garnet, MD  lisinopril (ZESTRIL) 20 MG tablet Take 20 mg by mouth daily. 09/16/20  Yes [provider]  Multiple Vitamin (MULTIVITAMIN WITH MINERALS) TABS tablet Take 1 tablet by mouth daily. 06/03/19  Yes Shahmehdi, Guadalupe Maple  A, MD  MYRBETRIQ 25 MG TB24 tablet Take 1 tablet (25 mg total) by mouth daily. 12/01/20  Yes Berton Mount I, MD  pantoprazole (PROTONIX) 40 MG tablet Take 1 tablet (40 mg total) by mouth daily. 12/02/20  Yes Berton Mount I, MD  polyethylene glycol (MIRALAX / GLYCOLAX) 17 g packet Take 17 g by mouth 2 (two) times daily. Patient taking differently: Take 17 g by mouth daily as needed for mild constipation. 05/11/20  Yes Glade Lloyd, MD  predniSONE (DELTASONE) 5 MG tablet Take 5 mg by mouth daily. 05/02/20  Yes [provider]  QUEtiapine (SEROQUEL) 100 MG tablet Take 100 mg by mouth daily. 05/02/20  Yes [provider]  darifenacin (ENABLEX) 7.5 MG 24 hr tablet Take 1 tablet (7.5 mg total) by mouth daily. Patient not taking: No sig reported 12/02/20   Barnetta Chapel, MD  ondansetron (ZOFRAN) 4 MG tablet Take 1 tablet (4 mg total) by mouth every 6 (six) hours as needed for nausea. Patient not taking:  Reported on 12/08/2020 04/04/19   Glade Lloyd, MD    Allergies    Patient has no known allergies.  Review of Systems   Review of Systems  Gastrointestinal: Positive for constipation.  Neurological: Positive for weakness.    Physical Exam Updated Vital Signs BP 116/73   Pulse 66   Temp 98.6 F (37 C) (Oral)   Resp 17   SpO2 98%   Physical Exam Vitals and nursing note reviewed.  Constitutional:      Appearance: Normal appearance.  HENT:     Head: Normocephalic and atraumatic.     Right Ear: External ear normal.     Left Ear: External ear normal.     Nose: Nose normal.     Mouth/Throat:     Mouth: Mucous membranes are dry.  Eyes:     Extraocular Movements: Extraocular movements intact.     Conjunctiva/sclera: Conjunctivae normal.     Pupils: Pupils are equal, round, and reactive to light.  Cardiovascular:     Rate and Rhythm: Normal rate and regular rhythm.     Pulses: Normal pulses.     Heart sounds: Normal heart sounds.  Pulmonary:     Effort: Pulmonary effort is normal.     Breath sounds: Normal breath sounds.  Abdominal:     General: Abdomen is flat. Bowel sounds are decreased.     Palpations: Abdomen is soft.  Musculoskeletal:        General: Normal range of motion.     Cervical back: Normal range of motion and neck supple.  Skin:    General: Skin is warm.     Capillary Refill: Capillary refill takes less than 2 seconds.  Neurological:     General: No focal deficit present.     Mental Status: She is alert and oriented to person, place, and time.  Psychiatric:        Mood and Affect: Mood normal.        Behavior: Behavior normal.        Thought Content: Thought content normal.        Judgment: Judgment normal.     ED Results / Procedures / Treatments   Labs (all labs ordered are listed, but only abnormal results are displayed) Labs Reviewed  SARS CORONAVIRUS 2 (TAT 6-24 HRS)  BASIC METABOLIC PANEL  CBC WITH DIFFERENTIAL/PLATELET  URINALYSIS,  ROUTINE W REFLEX MICROSCOPIC    EKG EKG Interpretation  Date/Time:  Saturday December 08 2020  12:16:08 EST Ventricular Rate:  63 PR Interval:    QRS Duration: 114 QT Interval:  445 QTC Calculation: 456 R Axis:   -35 Text Interpretation: Sinus rhythm Abnormal R-wave progression, late transition Left ventricular hypertrophy No significant change since last tracing Confirmed by Jacalyn Lefevre [phone]) on 12/08/2020 12:18:56 PM   Radiology No results found.  Procedures Procedures   Medications Ordered in ED Medications  sodium chloride 0.9 % bolus 1,000 mL (has no administration in time range)    ED Course  I have reviewed the triage vital signs and the nursing notes.  Pertinent labs & imaging results that were available during my care of the patient were reviewed by me and considered in my medical decision making (see chart for details).    MDM Rules/Calculators/A&P                          Labs have been delayed because she is a very hard stick.  Multiple people have attempted to stick pt.    If Cr is truly elevated, pt will need a readmission.  Pt signed out to Dr. Hyacinth Meeker at shift change.   Final Clinical Impression(s) / ED Diagnoses Final diagnoses:  AKI (acute kidney injury) [facility])    Rx / DC Orders ED Discharge Orders    None       Jacalyn Lefevre, MD 12/08/20 1520

## 2020-12-08 NOTE — Progress Notes (Signed)
New Admission Note:  Arrival Method: Via stretcher from ED to 67m10 Mental Orientation: Alert & Oriented x4 Telemetry: CCMD verified. Box-10 Assessment: Completed Skin: Refer to flowsheet IV: Left AC Pain: 0/10 Tubes: Safety Measures: Safety Fall Prevention Plan discussed with patient. Admission: Completed [facility] [place] Orientation: Patient has been orientated to the room, unit and the staff.  Orders have been reviewed and are being implemented. Will continue to monitor the patient. Call light has been placed within reach and bed alarm has been activated.   Aram Candela, RN  Phone Number: [phone]

## 2020-12-09 DIAGNOSIS — N179 Acute kidney failure, unspecified: Principal | ICD-10-CM

## 2020-12-09 DIAGNOSIS — M05749 Rheumatoid arthritis with rheumatoid factor of unspecified hand without organ or systems involvement: Secondary | ICD-10-CM | POA: Diagnosis not present

## 2020-12-09 DIAGNOSIS — R531 Weakness: Secondary | ICD-10-CM

## 2020-12-09 DIAGNOSIS — N1832 Chronic kidney disease, stage 3b: Secondary | ICD-10-CM

## 2020-12-09 LAB — COMPREHENSIVE METABOLIC PANEL
ALT: 13 U/L (ref 0–44)
AST: 12 U/L — ABNORMAL LOW (ref 15–41)
Albumin: 2.9 g/dL — ABNORMAL LOW (ref 3.5–5.0)
Alkaline Phosphatase: 62 U/L (ref 38–126)
Anion gap: 10 (ref 5–15)
BUN: 69 mg/dL — ABNORMAL HIGH (ref 8–23)
CO2: 21 mmol/L — ABNORMAL LOW (ref 22–32)
Calcium: 8.9 mg/dL (ref 8.9–10.3)
Chloride: 108 mmol/L (ref 98–111)
Creatinine, Ser: 4.08 mg/dL — ABNORMAL HIGH (ref 0.44–1.00)
GFR, Estimated: 11 mL/min — ABNORMAL LOW (ref >60)
Glucose, Bld: 155 mg/dL — ABNORMAL HIGH (ref 70–99)
Potassium: 4.6 mmol/L (ref 3.5–5.1)
Sodium: 139 mmol/L (ref 135–145)
Total Bilirubin: 0.7 mg/dL (ref 0.3–1.2)
Total Protein: 5.7 g/dL — ABNORMAL LOW (ref 6.5–8.1)

## 2020-12-09 LAB — CBC
HCT: 30.9 % — ABNORMAL LOW (ref 36.0–46.0)
Hemoglobin: 10.1 g/dL — ABNORMAL LOW (ref 12.0–15.0)
MCH: 30.9 pg (ref 26.0–34.0)
MCHC: 32.7 g/dL (ref 30.0–36.0)
MCV: 94.5 fL (ref 80.0–100.0)
Platelets: 166 10*3/uL (ref 150–400)
RBC: 3.27 MIL/uL — ABNORMAL LOW (ref 3.87–5.11)
RDW: 13.9 % (ref 11.5–15.5)
WBC: 8.1 10*3/uL (ref 4.0–10.5)
nRBC: 0 % (ref 0.0–0.2)

## 2020-12-09 LAB — URINE CULTURE: Culture: NO GROWTH

## 2020-12-09 MED ORDER — SODIUM CHLORIDE 0.9 % IV SOLN: INTRAVENOUS | Status: DC

## 2020-12-09 MED ORDER — CHLORHEXIDINE GLUCONATE CLOTH 2 % EX PADS
6.0000 | MEDICATED_PAD | Freq: Every day | CUTANEOUS | Status: DC
  Administered 2020-12-09 – 2020-12-11 (×2): 6 via TOPICAL

## 2020-12-09 NOTE — Plan of Care (Signed)
  Problem: Elimination: Goal: Will not experience complications related to urinary retention Outcome: Progressing   Problem: Fluid Volume: Goal: Fluid volume balance will be maintained or improved Outcome: Progressing

## 2020-12-09 NOTE — Progress Notes (Signed)
Patient ID: Michelle Graves, female   DOB: [birth year], 70 y.o.   MRN: [id number]  PROGRESS NOTE    Michelle Graves  GMW:[id number] DOB: 01-14-1951 DOA: 12/08/2020 PCP: [facility]   Brief Narrative:  70 year old female with history of rheumatoid arthritis, disorder, hypertension, UTIs, unspecified CVA, diverticular disease and recent diagnosis of colonic stricture treated medically, recent admission from 11/26/2020-12/01/2010 for AKI on CKD 3 along with colonic stricture requiring colonoscopy presented with weakness.  She was noted to have full bladder with evidence of obstructive uropathy with creatinine of 5.81.  Her creatinine during her recent discharge was 1.41 on 11/30/2020.  Foley catheter was placed.  She was started on IV fluids.  Assessment & Plan:   Acute kidney injury on chronic renal disease stage IIIb -Presented with creatinine of 5.81; creatinine on 11/30/2020 was 1.41. -Probably from obstructive uropathy.  Status post Foley catheter placement.  Renal ultrasound showed no evidence of hydronephrosis but showed distended bladder.  Creatinine improving to 4.08.  Switch IV fluids to normal saline at 100 cc an hour.  Repeat a.m. creatinine.  Will need outpatient urology follow-up  Anemia of chronic disease -Probably from renal failure.  Hemoglobin stable.  Recent diagnosis of colonic stricture -Outpatient follow-up with GI  History of rheumatoid arthritis -Continue home regimen  Hypertension next-blood pressure on the lower side.  Continue Coreg, clonidine, Cardizem and isosorbide mononitrate  Generalized deconditioning -PT OT eval    DVT prophylaxis: Heparin Code Status: Full Family Communication: None Disposition Plan: Status is: Inpatient  Remains inpatient appropriate because:Inpatient level of care appropriate due to severity of illness   Dispo: The patient is from: Home              Anticipated d/c is to: Home               Anticipated d/c date is: 1 day              Patient currently is not medically stable to d/c.   Difficult to place patient No   Consultants: None  Procedures: None  Antimicrobials: None   Subjective: Patient seen and examined at bedside.  Denies any worsening abdominal pain, nausea, vomiting or fever.  Feels slightly better.  Objective: Vitals:   12/08/20 2343 12/09/20 0444 12/09/20 0500 12/09/20 0840  BP: 109/63 108/85  114/78  Pulse: 73 65  65  Resp:    18  Temp: 97.8 F (36.6 C) 98.2 F (36.8 C)  98.2 F (36.8 C)  TempSrc: Oral Oral  Oral  SpO2: 100% 99%  99%  Weight:   78.5 kg     Intake/Output Summary (Last 24 hours) at 12/09/2020 1038 Last data filed at 12/09/2020 0927 Gross per 24 hour  Intake 2542.14 ml  Output 1100 ml  Net 1442.14 ml   Filed Weights   12/08/20 2042 12/09/20 0500  Weight: 78.5 kg 78.5 kg    Examination:  General exam: Appears calm and comfortable.  Currently on room air Respiratory system: Bilateral decreased breath sounds at bases with scattered crackles Cardiovascular system: S1 & S2 heard, Rate controlled Gastrointestinal system: Abdomen is nondistended, soft and nontender. Normal bowel sounds heard. Extremities: No cyanosis, clubbing; trace lower extremity edema present Central nervous system: Alert and oriented. No focal neurological deficits. Moving extremities Skin: No rashes, lesions or ulcers Psychiatry: Flat affect Genitourinary: Foley catheter present   Data Reviewed: I have personally reviewed following labs and imaging studies  CBC: Recent Labs  Lab 12/08/20 1555 12/09/20 0451  WBC 10.3 8.1  NEUTROABS 6.5  --   HGB 10.3* 10.1*  HCT 32.5* 30.9*  MCV 95.6 94.5  PLT 169 166   Basic Metabolic Panel: Recent Labs  Lab 12/08/20 1555 12/09/20 0451  NA 138 139  K 4.2 4.6  CL 103 108  CO2 23 21*  GLUCOSE 91 155*  BUN 82* 69*  CREATININE 5.81* 4.08*  CALCIUM 9.5 8.9   GFR: Estimated Creatinine Clearance: 14.3  mL/min (A) (by C-G formula based on SCr of 4.08 mg/dL (H)). Liver Function Tests: Recent Labs  Lab 12/09/20 0451  AST 12*  ALT 13  ALKPHOS 62  BILITOT 0.7  PROT 5.7*  ALBUMIN 2.9*   No results for input(s): LIPASE, AMYLASE in the last 168 hours. No results for input(s): AMMONIA in the last 168 hours. Coagulation Profile: No results for input(s): INR, PROTIME in the last 168 hours. Cardiac Enzymes: No results for input(s): CKTOTAL, CKMB, CKMBINDEX, TROPONINI in the last 168 hours. BNP (last 3 results) No results for input(s): PROBNP in the last 8760 hours. HbA1C: No results for input(s): HGBA1C in the last 72 hours. CBG: No results for input(s): GLUCAP in the last 168 hours. Lipid Profile: No results for input(s): CHOL, HDL, LDLCALC, TRIG, CHOLHDL, LDLDIRECT in the last 72 hours. Thyroid Function Tests: No results for input(s): TSH, T4TOTAL, FREET4, T3FREE, THYROIDAB in the last 72 hours. Anemia Panel: No results for input(s): VITAMINB12, FOLATE, FERRITIN, TIBC, IRON, RETICCTPCT in the last 72 hours. Sepsis Labs: No results for input(s): PROCALCITON, LATICACIDVEN in the last 168 hours.  Recent Results (from the past 240 hour(s))  SARS CORONAVIRUS 2 (TAT 6-24 HRS) Nasopharyngeal Nasopharyngeal Swab     Status: None   Collection Time: 12/08/20  4:03 PM   Specimen: Nasopharyngeal Swab  Result Value Ref Range Status   SARS Coronavirus 2 NEGATIVE NEGATIVE Final    Comment: (NOTE) SARS-CoV-2 target nucleic acids are NOT DETECTED.  The SARS-CoV-2 RNA is generally detectable in upper and lower respiratory specimens during the acute phase of infection. Negative results do not preclude SARS-CoV-2 infection, do not rule out co-infections with other pathogens, and should not be used as the sole basis for treatment or other patient management decisions. Negative results must be combined with clinical observations, patient history, and epidemiological information. The  expected result is Negative.  Fact Sheet for Patients: HairSlick.no  Fact Sheet for Healthcare Providers: quierodirigir.com  This test is not yet approved or cleared by the [place] FDA and  has been authorized for detection and/or diagnosis of SARS-CoV-2 by FDA under an Emergency Use Authorization (EUA). This EUA will remain  in effect (meaning this test can be used) for the duration of the COVID-19 declaration under Se ction 564(b)(1) of the Act, 21 U.S.C. section 360bbb-3(b)(1), unless the authorization is terminated or revoked sooner.  Performed at [facility], [address], [place], [place] [address]          Radiology Studies: US RENAL  Result Date: 12/08/2020 CLINICAL DATA:  Renal failure. EXAM: RENAL / URINARY TRACT ULTRASOUND COMPLETE COMPARISON:  None. FINDINGS: Right Kidney: Renal measurements: 8.5 x 4.5 x 4.7 cm = volume: 92.77 mL. Increased parenchymal echogenicity. Cortical thinning with some lobulation of the contour. No mass. No stone or hydronephrosis. Left Kidney: Renal measurements: 8.2 x 4.5 x 4.2 cm = volume: 82.1 mL. Increased parenchymal echogenicity. Renal cortical thinning. Lobulated contour. No mass, stone or hydronephrosis. Bladder: Distended.  Otherwise  unremarkable. Other: None. IMPRESSION: 1. Findings consistent with medical renal disease with reduced renal sizes, increased renal parenchymal echogenicity and renal cortical thinning. No masses, stones or hydronephrosis. 2. Distended bladder. Electronically Signed   By: Amie Portland M.D.   On: 12/08/2020 17:07   DG Abdomen Acute W/Chest  Result Date: 12/08/2020 CLINICAL DATA:  Constipation. EXAM: DG ABDOMEN ACUTE WITH 1 VIEW CHEST COMPARISON:  CT scan 11/26/2020 FINDINGS: The upright chest x-ray demonstrates normal heart size and mild tortuosity of the thoracic aorta. No acute pulmonary findings. No pleural effusion. No pulmonary lesions.  Two views of the abdomen demonstrate the moderate amount of stool in the right and proximal transverse colon. I do not see any significant stool in the left colon, sigmoid colon or rectum. No distended small bowel loops to suggest obstruction. The soft tissue shadows are grossly maintained. No worrisome calcifications. No significant bony findings. IMPRESSION: 1. No acute cardiopulmonary findings. 2. Moderate stool in the right and proximal transverse colon. Electronically Signed   By: Rudie Meyer M.D.   On: 12/08/2020 16:57        Scheduled Meds: . aspirin EC  81 mg Oral Daily  . carvedilol  25 mg Oral BID WC  . Chlorhexidine Gluconate Cloth  6 each Topical Daily  . cloNIDine  0.3 mg Oral BID  . diltiazem  180 mg Oral Daily  . docusate sodium  100 mg Oral BID  . DULoxetine  60 mg Oral QHS  . heparin  5,000 Units Subcutaneous Q8H  . hydroxychloroquine  200 mg Oral Daily  . isosorbide mononitrate  30 mg Oral Daily  . mirabegron ER  25 mg Oral Daily  . multivitamin with minerals  1 tablet Oral Daily  . pantoprazole  40 mg Oral Daily  . predniSONE  5 mg Oral Q breakfast  . QUEtiapine  100 mg Oral Daily   Continuous Infusions: . sodium chloride 100 mL/hr at 12/09/20 0910          Glade Lloyd, MD [facility] 12/09/2020, 10:38 AM

## 2020-12-10 DIAGNOSIS — R531 Weakness: Secondary | ICD-10-CM | POA: Diagnosis not present

## 2020-12-10 DIAGNOSIS — N179 Acute kidney failure, unspecified: Secondary | ICD-10-CM | POA: Diagnosis not present

## 2020-12-10 DIAGNOSIS — M05749 Rheumatoid arthritis with rheumatoid factor of unspecified hand without organ or systems involvement: Secondary | ICD-10-CM | POA: Diagnosis not present

## 2020-12-10 DIAGNOSIS — N1832 Chronic kidney disease, stage 3b: Secondary | ICD-10-CM | POA: Diagnosis not present

## 2020-12-10 LAB — CBC WITH DIFFERENTIAL/PLATELET
Abs Immature Granulocytes: 0.1 10*3/uL — ABNORMAL HIGH (ref 0.00–0.07)
Basophils Absolute: 0 10*3/uL (ref 0.0–0.1)
Basophils Relative: 0 %
Eosinophils Absolute: 0.2 10*3/uL (ref 0.0–0.5)
Eosinophils Relative: 3 %
HCT: 30 % — ABNORMAL LOW (ref 36.0–46.0)
Hemoglobin: 9.5 g/dL — ABNORMAL LOW (ref 12.0–15.0)
Immature Granulocytes: 1 %
Lymphocytes Relative: 27 %
Lymphs Abs: 2.3 10*3/uL (ref 0.7–4.0)
MCH: 30.4 pg (ref 26.0–34.0)
MCHC: 31.7 g/dL (ref 30.0–36.0)
MCV: 96.2 fL (ref 80.0–100.0)
Monocytes Absolute: 0.8 10*3/uL (ref 0.1–1.0)
Monocytes Relative: 9 %
Neutro Abs: 5 10*3/uL (ref 1.7–7.7)
Neutrophils Relative %: 60 %
Platelets: 163 10*3/uL (ref 150–400)
RBC: 3.12 MIL/uL — ABNORMAL LOW (ref 3.87–5.11)
RDW: 14 % (ref 11.5–15.5)
WBC: 8.3 10*3/uL (ref 4.0–10.5)
nRBC: 0 % (ref 0.0–0.2)

## 2020-12-10 LAB — BASIC METABOLIC PANEL
Anion gap: 9 (ref 5–15)
BUN: 47 mg/dL — ABNORMAL HIGH (ref 8–23)
CO2: 19 mmol/L — ABNORMAL LOW (ref 22–32)
Calcium: 8.6 mg/dL — ABNORMAL LOW (ref 8.9–10.3)
Chloride: 110 mmol/L (ref 98–111)
Creatinine, Ser: 2.41 mg/dL — ABNORMAL HIGH (ref 0.44–1.00)
GFR, Estimated: 21 mL/min — ABNORMAL LOW (ref >60)
Glucose, Bld: 116 mg/dL — ABNORMAL HIGH (ref 70–99)
Potassium: 4.6 mmol/L (ref 3.5–5.1)
Sodium: 138 mmol/L (ref 135–145)

## 2020-12-10 LAB — GLUCOSE, CAPILLARY
Glucose-Capillary: 122 mg/dL — ABNORMAL HIGH (ref 70–99)
Glucose-Capillary: 171 mg/dL — ABNORMAL HIGH (ref 70–99)

## 2020-12-10 NOTE — Evaluation (Signed)
Physical Therapy Evaluation Patient Details Name: Michelle Graves MRN: [id number] DOB: May 28, 1951 Today's Date: 12/10/2020   History of Present Illness  Michelle Graves is a 70 y.o. female  with decreased oral intake, weakness and noted to have worsening creatinine on admission.  Patient also was noted to have full bladder with evidence of obstructive uropathy.  Patient admitted with acute on chronic kidney injury. PMH:  colonic stricture, diverticular disease, rheumatoid arthritis, mood disorder hypertension, history of UTIs, history of CVA.  Patient recent admit 1/31-2/5 with AKI.  Clinical Impression  Pt admitted with above diagnosis. Pt was able to ambulate in hallway with overall good safety just needing occasional cues to sttay close to RW.  Pt states she feels she is at her baseline.  Will follow a few sessions to ensure pt is mobilizing while in hospital. Pt currently with functional limitations due to the deficits listed below (see PT Problem List). Pt will benefit from skilled PT to increase their independence and safety with mobility to allow discharge to the venue listed below.      Follow Up Recommendations Home health PT    Equipment Recommendations  None recommended by PT    Recommendations for Other Services       Precautions / Restrictions Precautions Precautions: Fall Restrictions Weight Bearing Restrictions: No      Mobility  Bed Mobility Overal bed mobility: Needs Assistance Bed Mobility: Supine to Sit;Sit to Supine     Supine to sit: Supervision Sit to supine: Supervision        Transfers Overall transfer level: Needs assistance Equipment used: Rolling walker (2 wheeled) Transfers: Sit to/from Stand Sit to Stand: Supervision         General transfer comment: No assist to stand to RW and pt used proper technique  Ambulation/Gait Ambulation/Gait assistance: Supervision;Min guard Gait Distance (Feet): 100 Feet Assistive device: Rolling walker  (2 wheeled) Gait Pattern/deviations: Step-through pattern;Decreased stride length;Trunk flexed;Wide base of support   Gait velocity interpretation: <1.31 ft/sec, indicative of household ambulator General Gait Details: Pt was able to ambulate with RW with cues to stay close to RW as she did not stay close to RW at all times.  Pt steady overall with RW.  Stairs            Wheelchair Mobility    Modified Rankin (Stroke Patients Only)       Balance Overall balance assessment: Needs assistance Sitting-balance support: No upper extremity supported;Feet supported Sitting balance-[name]: Good     Standing balance support: Bilateral upper extremity supported;During functional activity Standing balance-[name]: Fair Standing balance comment: fair static standing. benefits from UE support for dynamic tasks                             Pertinent Vitals/Pain Pain Assessment: No/denies pain    Home Living Family/patient expects to be discharged to:: Private residence Living Arrangements: Alone Available Help at Discharge: Family;Available PRN/intermittently Type of Home: Apartment Home Access: Elevator     Home Layout: One level Home Equipment: Walker - 2 wheels;Walker - 4 wheels;Cane - quad;Shower seat;Bedside commode;Wheelchair - manual;Grab bars - toilet;Grab bars - tub/shower;Toilet riser;Other (comment)      Prior Function Level of Independence: Independent with assistive device(s)         Comments: Pt uses RW inside for mobility, Rollator in the community. Pt reports Modified Independent with ADLs, has shower chair to sit on as needed. Goes  shopping in community with sister     Hand Dominance   Dominant Hand: Left    Extremity/Trunk Assessment   Upper Extremity Assessment Upper Extremity Assessment: Defer to OT evaluation    Lower Extremity Assessment Lower Extremity Assessment: Generalized weakness    Cervical / Trunk  Assessment Cervical / Trunk Assessment: Kyphotic (tends to flex trunk slightly)  Communication   Communication: No difficulties  Cognition Arousal/Alertness: Awake/alert Behavior During Therapy: WFL for tasks assessed/performed Overall Cognitive Status: No family/caregiver present to determine baseline cognitive functioning Area of Impairment: Safety/judgement                         Safety/Judgement: Decreased awareness of safety     General Comments: cues for RW use and sequencing (to stay close to RW)      General Comments General comments (skin integrity, edema, etc.): VSS    Exercises     Assessment/Plan    PT Assessment Patient needs continued PT services  PT Problem List Decreased strength;Decreased mobility;Decreased balance;Decreased activity tolerance;Decreased safety awareness       PT Treatment Interventions Gait training;DME instruction;Functional mobility training;Therapeutic activities;Therapeutic exercise;Balance training;Patient/family education    PT Goals (Current goals can be found in the Care Plan section)  Acute Rehab PT Goals Patient Stated Goal: to go home PT Goal Formulation: With patient Time For Goal Achievement: 12/24/20 Potential to Achieve Goals: Good    Frequency Min 3X/week   Barriers to discharge        Co-evaluation               AM-PAC PT "6 Clicks" Mobility  Outcome Measure Help needed turning from your back to your side while in a flat bed without using bedrails?: None Help needed moving from lying on your back to sitting on the side of a flat bed without using bedrails?: None Help needed moving to and from a bed to a chair (including a wheelchair)?: None Help needed standing up from a chair using your arms (e.g., wheelchair or bedside chair)?: None Help needed to walk in hospital room?: A Little Help needed climbing 3-5 steps with a railing? : A Little 6 Click Score: 22    End of Session Equipment Utilized  During Treatment: Gait belt Activity Tolerance: Patient limited by fatigue Patient left: Other (comment) (At sink with OT as OT came in and had pt at sink at end of PT treatment) Nurse Communication: Mobility status PT Visit Diagnosis: Muscle weakness (generalized) (M62.81)    Time: 6761-9509 PT Time Calculation (min) (ACUTE ONLY): 10 min   Charges:   PT Evaluation $PT Eval Low Complexity: 1 Low          Myldred Raju W,PT Acute Rehabilitation Services Pager:  [phone]  Office:  [phone]    Berline Lopes 12/10/2020, 12:58 PM

## 2020-12-10 NOTE — Progress Notes (Signed)
Patient ID: Michelle Graves, female   DOB: 28-Jul-1951, 70 y.o.   MRN: [id number]  PROGRESS NOTE    Michelle Graves  GLO:[id number] DOB: 02-Aug-1951 DOA: 12/08/2020 PCP: [facility]   Brief Narrative:  70 year old female with history of rheumatoid arthritis, disorder, hypertension, UTIs, unspecified CVA, diverticular disease and recent diagnosis of colonic stricture treated medically, recent admission from 11/26/2020-12/01/2010 for AKI on CKD 3 along with colonic stricture requiring colonoscopy presented with weakness.  She was noted to have full bladder with evidence of obstructive uropathy with creatinine of 5.81.  Her creatinine during her recent discharge was 1.41 on 11/30/2020.  Foley catheter was placed.  She was started on IV fluids.  Assessment & Plan:   Acute kidney injury on chronic renal disease stage IIIb -Presented with creatinine of 5.81; creatinine on 11/30/2020 was 1.41. -Probably from obstructive uropathy.  Status post Foley catheter placement.  Renal ultrasound showed no evidence of hydronephrosis but showed distended bladder.  Creatinine pending for today.  Continue normal saline at 100 cc an hour.  Repeat a.m. creatinine.  Will need outpatient urology follow-up.  Patient will need to be discharged home with Foley catheter  Anemia of chronic disease -Probably from renal failure.  Hemoglobin stable.  Recent diagnosis of colonic stricture -Outpatient follow-up with GI  History of rheumatoid arthritis -Continue home regimen  Hypertension next-blood pressure on the lower side.  Continue Coreg, clonidine, Cardizem and isosorbide mononitrate  Generalized deconditioning -PT OT eval    DVT prophylaxis: Heparin Code Status: Full Family Communication: None Disposition Plan: Status is: Inpatient  Remains inpatient appropriate because:Inpatient level of care appropriate due to severity of illness   Dispo: The patient is from: Home               Anticipated d/c is to: Home              Anticipated d/c date is: 1 day              Patient currently is not medically stable to d/c.   Difficult to place patient No   Consultants: None  Procedures: None  Antimicrobials: None   Subjective: Patient seen and examined at bedside.  No overnight fever, vomiting, worsening lower abdominal pain or shortness of breath reported.  Still feels weak.  Does not have much appetite Objective: Vitals:   12/09/20 0840 12/09/20 1633 12/09/20 2200 12/10/20 0527  BP: 114/78 96/63 104/70 112/84  Pulse: 65 66 70 63  Resp: 18 18 18 18   Temp: 98.2 F (36.8 C) 97.8 F (36.6 C) 98.1 F (36.7 C) 98.7 F (37.1 C)  TempSrc: Oral Oral Oral   SpO2: 99% 99% 97% 97%  Weight:        Intake/Output Summary (Last 24 hours) at 12/10/2020 0728 Last data filed at 12/10/2020 12/12/2020 Gross per 24 hour  Intake 3002.78 ml  Output 1750 ml  Net 1252.78 ml   Filed Weights   12/08/20 2042 12/09/20 0500  Weight: 78.5 kg 78.5 kg    Examination:  General exam: No acute distress.  Still on room air currently. Respiratory system: Decreased breath sounds at bases bilaterally, no wheezing Cardiovascular system: Rate controlled, S1-S2 heard Gastrointestinal system: Abdomen is nondistended, soft and mildly tender in the lower quadrant.  Bowel sounds heard  extremities: Mild lower extremity edema present; no clubbing Central nervous system: Awake and alert.  No focal neurological deficits.  Moves extremities Skin: No obvious ecchymosis/rashes Psychiatry: Affect is flat  Genitourinary: Foley catheter is present  Data Reviewed: I have personally reviewed following labs and imaging studies  CBC: Recent Labs  Lab 12/08/20 1555 12/09/20 0451  WBC 10.3 8.1  NEUTROABS 6.5  --   HGB 10.3* 10.1*  HCT 32.5* 30.9*  MCV 95.6 94.5  PLT 169 166   Basic Metabolic Panel: Recent Labs  Lab 12/08/20 1555 12/09/20 0451  NA 138 139  K 4.2 4.6  CL 103 108  CO2 23 21*   GLUCOSE 91 155*  BUN 82* 69*  CREATININE 5.81* 4.08*  CALCIUM 9.5 8.9   GFR: Estimated Creatinine Clearance: 14.3 mL/min (A) (by C-G formula based on SCr of 4.08 mg/dL (H)). Liver Function Tests: Recent Labs  Lab 12/09/20 0451  AST 12*  ALT 13  ALKPHOS 62  BILITOT 0.7  PROT 5.7*  ALBUMIN 2.9*   No results for input(s): LIPASE, AMYLASE in the last 168 hours. No results for input(s): AMMONIA in the last 168 hours. Coagulation Profile: No results for input(s): INR, PROTIME in the last 168 hours. Cardiac Enzymes: No results for input(s): CKTOTAL, CKMB, CKMBINDEX, TROPONINI in the last 168 hours. BNP (last 3 results) No results for input(s): PROBNP in the last 8760 hours. HbA1C: No results for input(s): HGBA1C in the last 72 hours. CBG: No results for input(s): GLUCAP in the last 168 hours. Lipid Profile: No results for input(s): CHOL, HDL, LDLCALC, TRIG, CHOLHDL, LDLDIRECT in the last 72 hours. Thyroid Function Tests: No results for input(s): TSH, T4TOTAL, FREET4, T3FREE, THYROIDAB in the last 72 hours. Anemia Panel: No results for input(s): VITAMINB12, FOLATE, FERRITIN, TIBC, IRON, RETICCTPCT in the last 72 hours. Sepsis Labs: No results for input(s): PROCALCITON, LATICACIDVEN in the last 168 hours.  Recent Results (from the past 240 hour(s))  SARS CORONAVIRUS 2 (TAT 6-24 HRS) Nasopharyngeal Nasopharyngeal Swab     Status: None   Collection Time: 12/08/20  4:03 PM   Specimen: Nasopharyngeal Swab  Result Value Ref Range Status   SARS Coronavirus 2 NEGATIVE NEGATIVE Final    Comment: (NOTE) SARS-CoV-2 target nucleic acids are NOT DETECTED.  The SARS-CoV-2 RNA is generally detectable in upper and lower respiratory specimens during the acute phase of infection. Negative results do not preclude SARS-CoV-2 infection, do not rule out co-infections with other pathogens, and should not be used as the sole basis for treatment or other patient management decisions. Negative  results must be combined with clinical observations, patient history, and epidemiological information. The expected result is Negative.  Fact Sheet for Patients: HairSlick.no  Fact Sheet for Healthcare Providers: quierodirigir.com  This test is not yet approved or cleared by the [place] FDA and  has been authorized for detection and/or diagnosis of SARS-CoV-2 by FDA under an Emergency Use Authorization (EUA). This EUA will remain  in effect (meaning this test can be used) for the duration of the COVID-19 declaration under Se ction 564(b)(1) of the Act, 21 U.S.C. section 360bbb-3(b)(1), unless the authorization is terminated or revoked sooner.  Performed at [facility], [address], [place], [place] [address]   Urine Culture     Status: None   Collection Time: 12/08/20  6:37 PM   Specimen: Urine, Catheterized  Result Value Ref Range Status   Specimen Description URINE, CATHETERIZED  Final   Special Requests NONE  Final   Culture   Final    NO GROWTH Performed at [facility], [address] [place], [place] [address]    Report Status  12/09/2020 FINAL  Final         Radiology Studies: US RENAL  Result Date: 12/08/2020 CLINICAL DATA:  Renal failure. EXAM: RENAL / URINARY TRACT ULTRASOUND COMPLETE COMPARISON:  None. FINDINGS: Right Kidney: Renal measurements: 8.5 x 4.5 x 4.7 cm = volume: 92.77 mL. Increased parenchymal echogenicity. Cortical thinning with some lobulation of the contour. No mass. No stone or hydronephrosis. Left Kidney: Renal measurements: 8.2 x 4.5 x 4.2 cm = volume: 82.1 mL. Increased parenchymal echogenicity. Renal cortical thinning. Lobulated contour. No mass, stone or hydronephrosis. Bladder: Distended.  Otherwise unremarkable. Other: None. IMPRESSION: 1. Findings consistent with medical renal disease with reduced renal sizes, increased renal parenchymal echogenicity and renal  cortical thinning. No masses, stones or hydronephrosis. 2. Distended bladder. Electronically Signed   By: Amie Portland M.D.   On: 12/08/2020 17:07   DG Abdomen Acute W/Chest  Result Date: 12/08/2020 CLINICAL DATA:  Constipation. EXAM: DG ABDOMEN ACUTE WITH 1 VIEW CHEST COMPARISON:  CT scan 11/26/2020 FINDINGS: The upright chest x-ray demonstrates normal heart size and mild tortuosity of the thoracic aorta. No acute pulmonary findings. No pleural effusion. No pulmonary lesions. Two views of the abdomen demonstrate the moderate amount of stool in the right and proximal transverse colon. I do not see any significant stool in the left colon, sigmoid colon or rectum. No distended small bowel loops to suggest obstruction. The soft tissue shadows are grossly maintained. No worrisome calcifications. No significant bony findings. IMPRESSION: 1. No acute cardiopulmonary findings. 2. Moderate stool in the right and proximal transverse colon. Electronically Signed   By: Rudie Meyer M.D.   On: 12/08/2020 16:57        Scheduled Meds: . aspirin EC  81 mg Oral Daily  . carvedilol  25 mg Oral BID WC  . Chlorhexidine Gluconate Cloth  6 each Topical Daily  . cloNIDine  0.3 mg Oral BID  . diltiazem  180 mg Oral Daily  . docusate sodium  100 mg Oral BID  . DULoxetine  60 mg Oral QHS  . heparin  5,000 Units Subcutaneous Q8H  . hydroxychloroquine  200 mg Oral Daily  . isosorbide mononitrate  30 mg Oral Daily  . mirabegron ER  25 mg Oral Daily  . multivitamin with minerals  1 tablet Oral Daily  . pantoprazole  40 mg Oral Daily  . predniSONE  5 mg Oral Q breakfast  . QUEtiapine  100 mg Oral Daily   Continuous Infusions: . sodium chloride 100 mL/hr at 12/10/20 0631          Glade Lloyd, MD [facility] 12/10/2020, 7:28 AM

## 2020-12-10 NOTE — Evaluation (Signed)
Occupational Therapy Evaluation Patient Details Name: Michelle Graves MRN: [id number] DOB: 04/29/1951 Today's Date: 12/10/2020    History of Present Illness Michelle Graves is a 70 y.o. female  with decreased oral intake, weakness and noted to have worsening creatinine on admission.  Patient also was noted to have full bladder with evidence of obstructive uropathy.  Patient admitted with acute on chronic kidney injury. PMH:  colonic stricture, diverticular disease, rheumatoid arthritis, mood disorder hypertension, history of UTIs, history of CVA.  Patient recent admit 1/31-2/5 with AKI.   Clinical Impression   Pt is at Mod I - Sup level with ADLs/selfcare and functional mobility using RW. Pt states that she feels she is at baseline. All education completed and no further acute OT services are indicated at this time. OT will sign off   Follow Up Recommendations  No OT follow up;Supervision - Intermittent    Equipment Recommendations  None recommended by OT    Recommendations for Other Services       Precautions / Restrictions Precautions Precautions: Fall Restrictions Weight Bearing Restrictions: No      Mobility Bed Mobility Overal bed mobility: Needs Assistance Bed Mobility: Supine to Sit     Supine to sit: Supervision Sit to supine: Supervision   General bed mobility comments: Supervision for safety with lines. Increased time required.    Transfers Overall transfer level: Needs assistance Equipment used: Rolling walker (2 wheeled) Transfers: Sit to/from Stand Sit to Stand: Supervision         General transfer comment: No assist to stand to RW and pt used proper technique    Balance Overall balance assessment: Needs assistance Sitting-balance support: No upper extremity supported;Feet supported Sitting balance-[name]: Good     Standing balance support: Bilateral upper extremity supported;During functional activity Standing balance-[name]:  Fair Standing balance comment: fair static standing. benefits from UE support for dynamic tasks                           ADL either performed or assessed with clinical judgement   ADL Overall ADL's : Modified independent                                             Vision Baseline Vision/History: Wears glasses Wears Glasses: At all times Patient Visual Report: No change from baseline       Perception     Praxis      Pertinent Vitals/Pain Pain Assessment: No/denies pain Faces Pain Scale: No hurt Pain Intervention(s): Monitored during session     Hand Dominance Left   Extremity/Trunk Assessment Upper Extremity Assessment Upper Extremity Assessment: Overall WFL for tasks assessed   Lower Extremity Assessment Lower Extremity Assessment: Defer to PT evaluation   Cervical / Trunk Assessment Cervical / Trunk Assessment: Kyphotic   Communication Communication Communication: No difficulties   Cognition Arousal/Alertness: Awake/alert Behavior During Therapy: WFL for tasks assessed/performed Overall Cognitive Status: No family/caregiver present to determine baseline cognitive functioning Area of Impairment: Safety/judgement                         Safety/Judgement: Decreased awareness of safety     General Comments: cues for RW use and sequencing (to stay close to RW)   General Comments  VSS    Exercises  Shoulder Instructions      Home Living Family/patient expects to be discharged to:: Private residence Living Arrangements: Alone Available Help at Discharge: Family;Available PRN/intermittently Type of Home: Apartment Home Access: Elevator     Home Layout: One level     Bathroom Shower/Tub: Chief Strategy Officer: Handicapped height Bathroom Accessibility: Yes   Home Equipment: Environmental consultant - 2 wheels;Walker - 4 wheels;Cane - quad;Shower seat;Bedside commode;Wheelchair - manual;Grab bars - toilet;Grab  bars - tub/shower;Toilet riser;Other (comment)   Additional Comments: Patients daughter lives around the corner       Prior Functioning/Environment Level of Independence: Independent with assistive device(s)        Comments: Pt uses RW inside for mobility, Rollator in the community. Pt reports Modified Independent with ADLs, has shower chair to sit on as needed. Goes shopping in community with sister        OT Problem List: Decreased activity tolerance;Decreased safety awareness      OT Treatment/Interventions:      OT Goals(Current goals can be found in the care plan section) Acute Rehab OT Goals Patient Stated Goal: to go home OT Goal Formulation: All assessment and education complete, DC therapy  OT Frequency:     Barriers to D/C:            Co-evaluation              AM-PAC OT "6 Clicks" Daily Activity     Outcome Measure Help from another person eating meals?: None Help from another person taking care of personal grooming?: None Help from another person toileting, which includes using toliet, bedpan, or urinal?: None Help from another person bathing (including washing, rinsing, drying)?: None Help from another person to put on and taking off regular upper body clothing?: None Help from another person to put on and taking off regular lower body clothing?: None 6 Click Score: 24   End of Session Equipment Utilized During Treatment: Gait belt;Rolling walker Nurse Communication: Mobility status  Activity Tolerance: Patient tolerated treatment well Patient left: with call bell/phone within reach;in chair;with chair alarm set  OT Visit Diagnosis: Muscle weakness (generalized) (M62.81)                Time: 0950-1004 OT Time Calculation (min): 14 min Charges:  OT General Charges $OT Visit: 1 Visit    Galen Manila 12/10/2020, 1:31 PM

## 2020-12-10 NOTE — Plan of Care (Signed)
  Problem: Education: Goal: Knowledge of General Education information will improve Description: Including pain rating scale, medication(s)/side effects and non-pharmacologic comfort measures Outcome: Progressing   Problem: Activity: Goal: Risk for activity intolerance will decrease Outcome: Progressing   

## 2020-12-10 NOTE — TOC Initial Note (Signed)
Transition of Care [facility]) - Initial/Assessment Note    Patient Details  Name: Michelle Graves MRN: [id number] Date of Birth: [birth year]  Transition of Care [facility]) CM/SW Contact:    Bess Kinds, RN Phone Number: [phone] 12/10/2020, 2:36 PM  Clinical Narrative:                  Spoke with patient at the bedside. PTA living at [facility]. Stated that her daughter checks in on her almost every day and cooks her meals. Daughter provides transportation to medical appointments and will provide transportation home. Discussed recommendations for [facility] PT - confirmed that patient is active will [facility] PT. Will need HH PT order for resumption of services. Stated that her PCP is Lowella Petties, PA-C. TOC following for transition needs.   Expected Discharge Plan: Home w Home Health Services Barriers to Discharge: Continued Medical Work up   Patient Goals and CMS Choice Patient states their goals for this hospitalization and ongoing recovery are:: to return home CMS Medicare.gov Compare Post Acute Care list provided to:: Patient Choice offered to / list presented to : Patient  Expected Discharge Plan and Services Expected Discharge Plan: Home w Home Health Services In-house Referral: NA Discharge Planning Services: CM Consult Post Acute Care Choice: Home Health Living arrangements for the past 2 months: Apartment                 DME Arranged: N/A DME Agency: NA       HH Arranged: PT HH AgencyHotel manager Home Health Care Date [facility] Agency Contacted: 12/10/20 Time HH Agency Contacted: [phone] Representative spoke with at [facility] Agency: Kandee Keen  Prior Living Arrangements/Services Living arrangements for the past 2 months: Apartment Lives with:: Self Patient language and need for interpreter reviewed:: Yes Do you feel safe going back to the place where you live?: Yes      Need for Family Participation in Patient Care: Yes (Comment) Care giver support system in place?: Yes (comment) Current home  services: DME (RW; rollator; [facility]) Criminal Activity/Legal Involvement Pertinent to Current Situation/Hospitalization: No - Comment as needed  Activities of Daily Living Home Assistive Devices/Equipment: None ADL Screening (condition at time of admission) Patient's cognitive ability adequate to safely complete daily activities?: Yes Is the patient deaf or have difficulty hearing?: No Does the patient have difficulty seeing, even when wearing glasses/contacts?: No Does the patient have difficulty concentrating, remembering, or making decisions?: No Patient able to express need for assistance with ADLs?: Yes Does the patient have difficulty dressing or bathing?: Yes Independently performs ADLs?: Yes (appropriate for developmental age) Does the patient have difficulty walking or climbing stairs?: Yes Weakness of Legs: Both Weakness of Arms/Hands: None  Permission Sought/Granted                  Emotional Assessment Appearance:: Appears stated age Attitude/Demeanor/Rapport: Engaged Affect (typically observed): Accepting Orientation: : Oriented to Self,Oriented to  Time,Oriented to Place,Oriented to Situation Alcohol / Substance Use: Not Applicable Psych Involvement: No (comment)  Admission diagnosis:  Obstructive uropathy [N13.9] AKI (acute kidney injury) (HCC) [N17.9] Patient Active Problem List   Diagnosis Date Noted  . 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 Bowel obstruction (HCC) 05/06/2020  . SIRS (systemic inflammatory response syndrome) (HCC) 05/06/2020  . UTI (urinary tract infection) 11/08/2019  . Major depressive disorder, recurrent episode, moderate (HCC) 11/08/2019  . Urinary retention 10/28/2019  . Acute on chronic kidney failure (HCC) 10/27/2019  . Malnutrition of moderate degree 10/03/2019  .  Colitis 09/30/2019  . AKI (acute kidney injury) (HCC) 05/28/2019  . Nausea & vomiting 05/28/2019  . 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 Anemia, chronic disease 05/28/2019   . Swelling   . Small vessel disease, cerebrovascular   . Acute ischemic stroke (HCC)   . Weakness   . Acute left ankle pain   . Acute on chronic renal failure (HCC) 05/13/2019  . Weakness of both lower extremities 05/13/2019  . Cerebral thrombosis with cerebral infarction 03/31/2019  . New onset a-fib (HCC) 03/27/2019  . Acute kidney injury superimposed on CKD (HCC) 03/24/2019  . Hypotension 03/24/2019  . Hematemesis 03/24/2019  . Dark stools 03/24/2019  . Diverticulitis of intestine without perforation or abscess without bleeding   . Sinus tachycardia 08/27/2016  . Diverticulitis 08/26/2016  . HYPERTENSION, MALIGNANT ESSENTIAL 06/24/2007  . KIDNEY DISEASE, CHRONIC, STAGE III 06/24/2007  . Rheumatoid arthritis (HCC) 06/24/2007   PCP:  [facility], [facility]:   [id number] - [name], [place] - [address] AT [facility] [address] [place] [address] Phone: [phone] Fax: [phone]     Social Determinants of Health (SDOH) Interventions    Readmission Risk Interventions Readmission Risk Prevention Plan 05/10/2020 05/10/2020 10/28/2019  Transportation Screening - Complete Complete  PCP or Specialist Appt within 3-5 Days - - -  Not Complete comments - - -  HRI or Home Care Consult - - -  HRI or Home Care Consult comments - - -  Social Work Consult for Recovery Care Planning/Counseling - - -  Palliative Care Screening - - -  Medication Review Oceanographer) - Referral to Pharmacy Referral to Pharmacy  PCP or Specialist appointment within 3-5 days of discharge Complete Complete Not Complete  PCP/Specialist Appt Not Complete comments - - due to holiday, pt to make appointment  HRI or Home Care Consult - Complete Complete  SW Recovery Care/Counseling Consult - - Complete  Palliative Care Screening - Not Applicable Not Applicable  Skilled Nursing Facility - Patient Refused Complete   Some recent data might be hidden

## 2020-12-11 DIAGNOSIS — R531 Weakness: Secondary | ICD-10-CM | POA: Diagnosis not present

## 2020-12-11 DIAGNOSIS — R339 Retention of urine, unspecified: Secondary | ICD-10-CM | POA: Diagnosis not present

## 2020-12-11 DIAGNOSIS — N179 Acute kidney failure, unspecified: Secondary | ICD-10-CM | POA: Diagnosis not present

## 2020-12-11 DIAGNOSIS — M05749 Rheumatoid arthritis with rheumatoid factor of unspecified hand without organ or systems involvement: Secondary | ICD-10-CM | POA: Diagnosis not present

## 2020-12-11 LAB — CBC WITH DIFFERENTIAL/PLATELET
Abs Immature Granulocytes: 0.12 10*3/uL — ABNORMAL HIGH (ref 0.00–0.07)
Basophils Absolute: 0 10*3/uL (ref 0.0–0.1)
Basophils Relative: 1 %
Eosinophils Absolute: 0.3 10*3/uL (ref 0.0–0.5)
Eosinophils Relative: 3 %
HCT: 30.1 % — ABNORMAL LOW (ref 36.0–46.0)
Hemoglobin: 9.2 g/dL — ABNORMAL LOW (ref 12.0–15.0)
Immature Granulocytes: 1 %
Lymphocytes Relative: 35 %
Lymphs Abs: 2.9 10*3/uL (ref 0.7–4.0)
MCH: 29.9 pg (ref 26.0–34.0)
MCHC: 30.6 g/dL (ref 30.0–36.0)
MCV: 97.7 fL (ref 80.0–100.0)
Monocytes Absolute: 0.8 10*3/uL (ref 0.1–1.0)
Monocytes Relative: 9 %
Neutro Abs: 4.3 10*3/uL (ref 1.7–7.7)
Neutrophils Relative %: 51 %
Platelets: 181 10*3/uL (ref 150–400)
RBC: 3.08 MIL/uL — ABNORMAL LOW (ref 3.87–5.11)
RDW: 14 % (ref 11.5–15.5)
WBC: 8.3 10*3/uL (ref 4.0–10.5)
nRBC: 0 % (ref 0.0–0.2)

## 2020-12-11 LAB — BASIC METABOLIC PANEL
Anion gap: 8 (ref 5–15)
BUN: 33 mg/dL — ABNORMAL HIGH (ref 8–23)
CO2: 18 mmol/L — ABNORMAL LOW (ref 22–32)
Calcium: 8.4 mg/dL — ABNORMAL LOW (ref 8.9–10.3)
Chloride: 113 mmol/L — ABNORMAL HIGH (ref 98–111)
Creatinine, Ser: 1.81 mg/dL — ABNORMAL HIGH (ref 0.44–1.00)
GFR, Estimated: 30 mL/min — ABNORMAL LOW (ref >60)
Glucose, Bld: 117 mg/dL — ABNORMAL HIGH (ref 70–99)
Potassium: 4.7 mmol/L (ref 3.5–5.1)
Sodium: 139 mmol/L (ref 135–145)

## 2020-12-11 LAB — MAGNESIUM: Magnesium: 1.4 mg/dL — ABNORMAL LOW (ref 1.7–2.4)

## 2020-12-11 LAB — GLUCOSE, CAPILLARY
Glucose-Capillary: 110 mg/dL — ABNORMAL HIGH (ref 70–99)
Glucose-Capillary: 114 mg/dL — ABNORMAL HIGH (ref 70–99)

## 2020-12-11 IMAGING — MR MRI HEAD WITHOUT CONTRAST
12 of 13 series · 44 of 48 positions shown · non-contrast
Comparison: Head CT 05/13/2019 and MRI 03/30/2019

CLINICAL DATA: Bilateral lower extremity proximal muscle weakness.
Stroke in [DATE] with left-sided deficits.

EXAM:
MRI HEAD WITHOUT CONTRAST
TECHNIQUE: Multiplanar, multiecho pulse sequences of the brain and surrounding
structures were obtained without intravenous contrast.

[Series 5: DWI · axial · 3.0mm · 0.88mm/px · z∈[-102,+44]mm · 9 of 104 slices shown (1 of 4)]
[im 1/104]
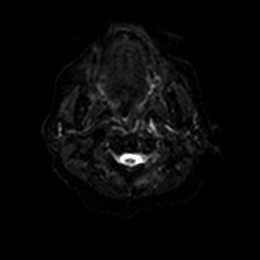
[im 13/104]
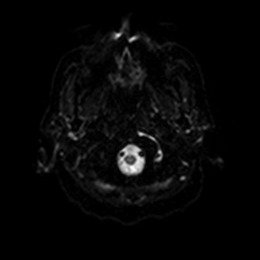
[im 26/104]
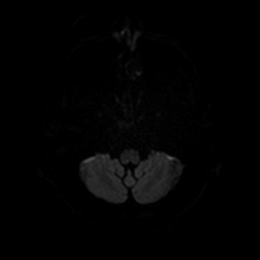
[im 39/104]
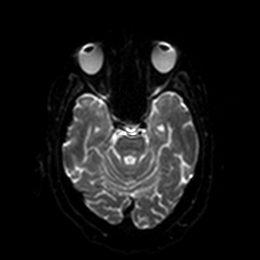
[im 52/104]
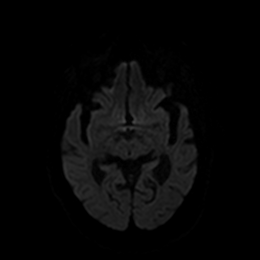
[im 65/104]
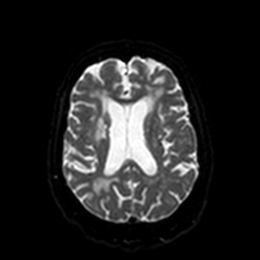
[im 78/104]
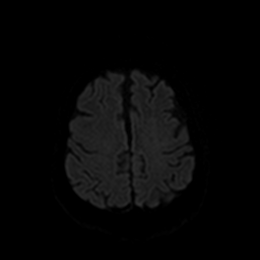
[im 91/104]
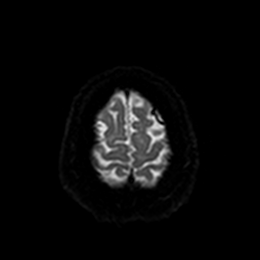
[im 104/104]
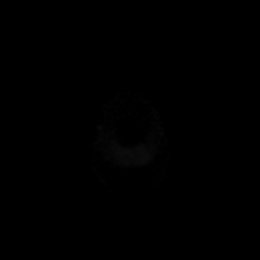

[Series 6: DWI · axial · 3.0mm · 0.88mm/px · z∈[-102,+44]mm · 4 of 52 slices shown (2 of 4)]
[im 1/52]
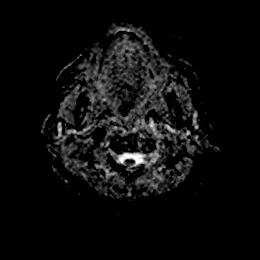
[im 18/52]
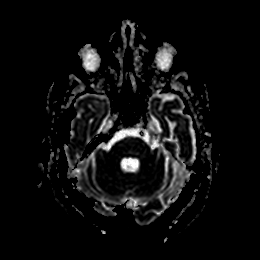
[im 35/52]
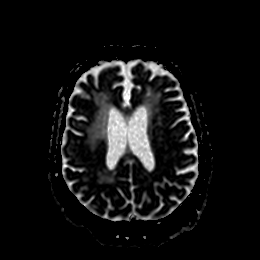
[im 52/52]
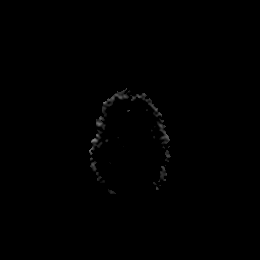

[Series 7: DWI · coronal · 4.0mm · 0.88mm/px · 5 of 72 slices shown (3 of 4)]
[im 1/72]
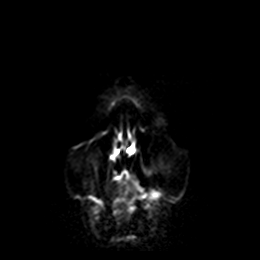
[im 18/72]
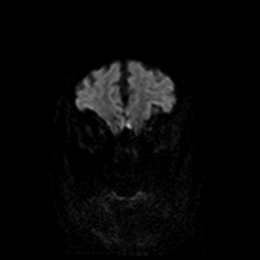
[im 36/72]
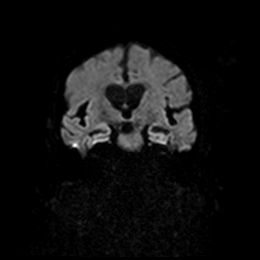
[im 54/72]
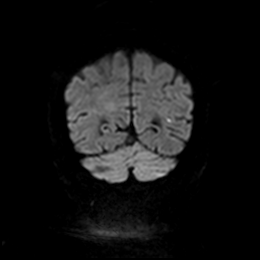
[im 72/72]
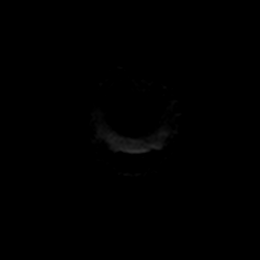

[Series 8: DWI · coronal · 4.0mm · 0.88mm/px · 3 of 36 slices shown (4 of 4)]
[im 1/36]
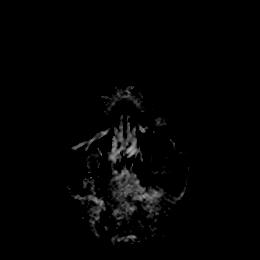
[im 18/36]
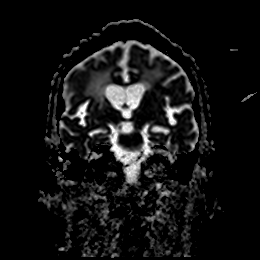
[im 36/36]
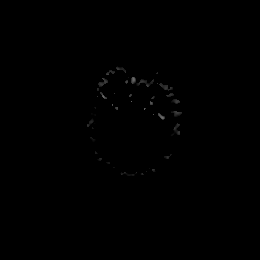

[Series 9: mag_images · axial · 3.0mm · 0.90mm/px · z∈[-111,+57]mm · 4 of 60 slices shown]
[im 1/60]
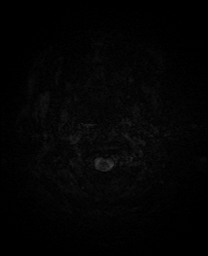
[im 20/60]
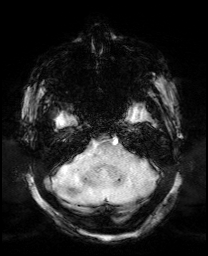
[im 40/60]
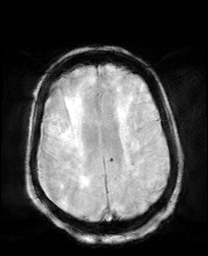
[im 60/60]
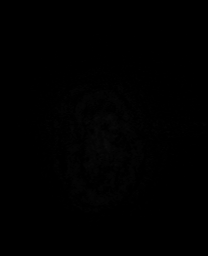

[Series 10: pha_images · axial · 3.0mm · 0.90mm/px · z∈[-111,+57]mm · 4 of 60 slices shown]
[im 1/60]
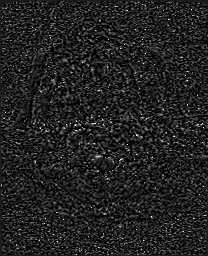
[im 20/60]
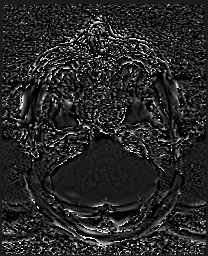
[im 40/60]
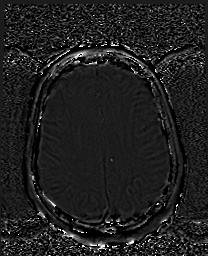
[im 60/60]
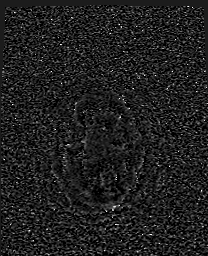

[Series 11: swi_images · axial · 3.0mm · 0.90mm/px · z∈[-111,+57]mm · 4 of 60 slices shown]
[im 1/60]
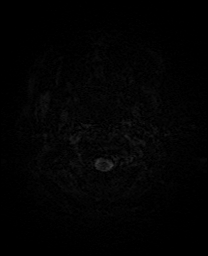
[im 20/60]
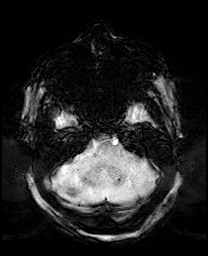
[im 40/60]
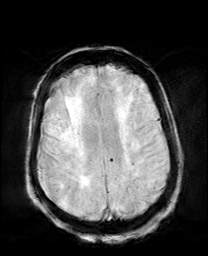
[im 60/60]
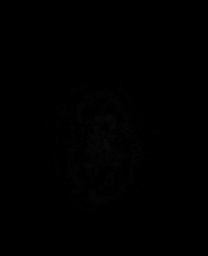

[Series 12: mip_images(sw) · axial · 24.0mm · 0.90mm/px · z∈[-101,+47]mm · 4 of 53 slices shown]
[im 1/53]
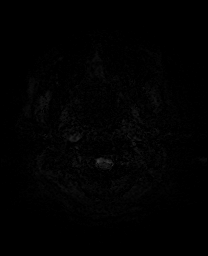
[im 18/53]
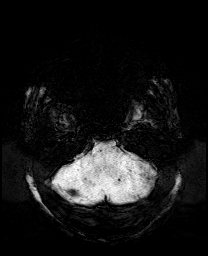
[im 35/53]
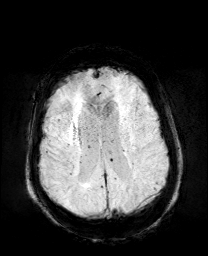
[im 53/53]
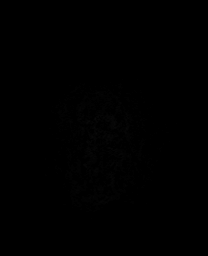

[Series 13: T1 · sagittal · 5.0mm · 0.75mm/px · 1 of 20 slices shown]
[im 1/20]
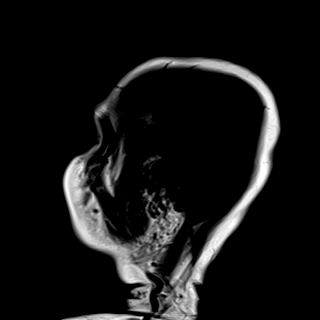

[Series 14: T2 · axial · 5.0mm · 0.72mm/px · z∈[-104,+33]mm · 2 of 25 slices shown (1 of 2)]
[im 1/25]
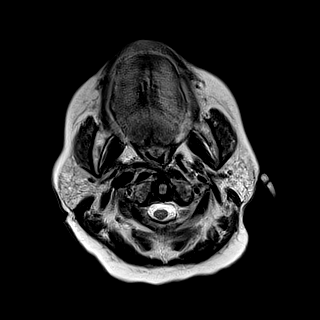
[im 25/25]
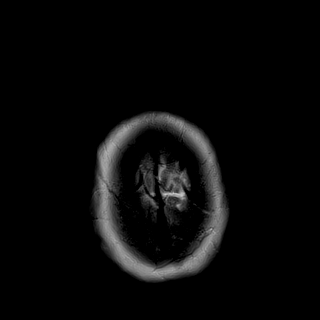

[Series 15: FLAIR · axial · 5.0mm · 0.45mm/px · z∈[-104,+33]mm · 2 of 25 slices shown]
[im 1/25]
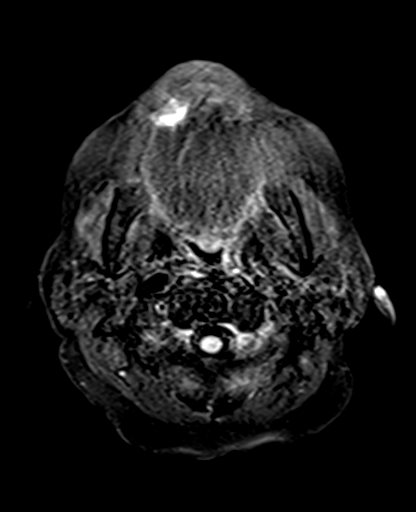
[im 25/25]
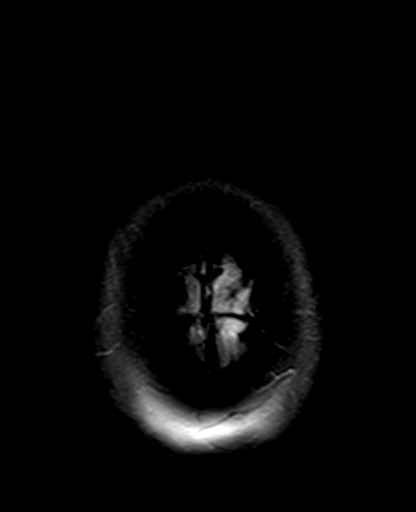

[Series 17: T2 · coronal · 5.0mm · 0.34mm/px · 2 of 26 slices shown (2 of 2)]
[im 1/26]
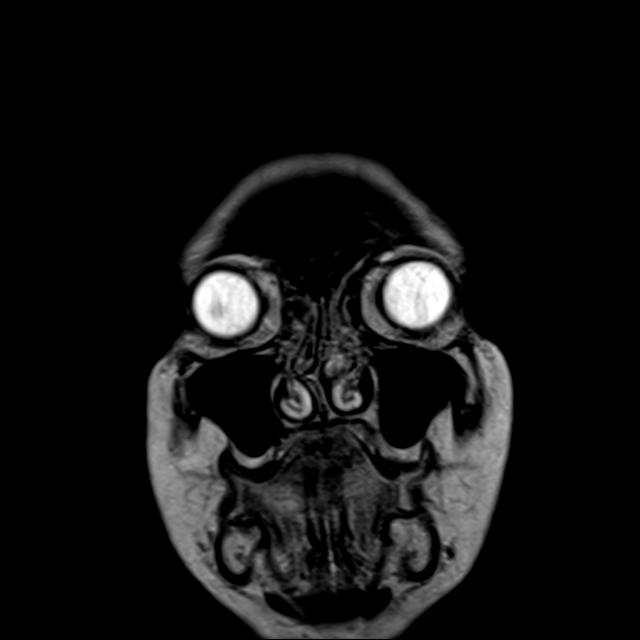
[im 26/26]
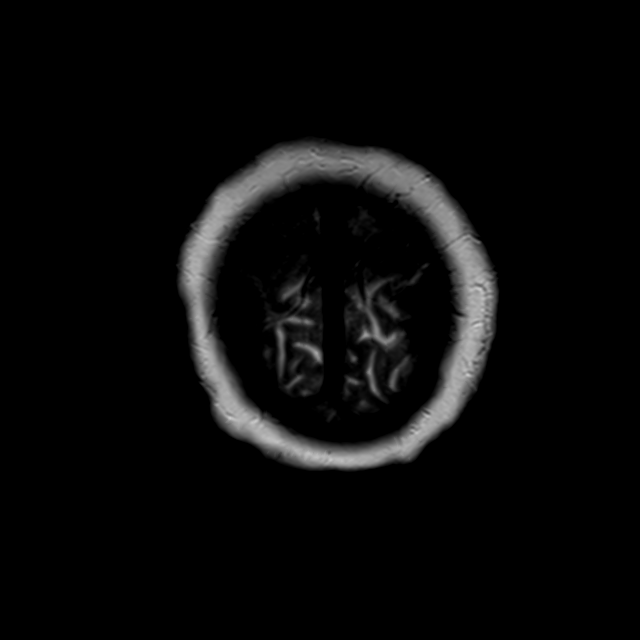

[44 of 48 positions shown; findings below may reference images not displayed]

FINDINGS: The study is mildly motion degraded.

Brain: There is a 5 mm acute infarct involving subcortical white
matter anteriorly in the right frontal lobe (series 5, image 85).
The acute lacunar infarct in the right centrum semiovale on the
prior MRI demonstrates expected interval evolution, now with focal
encephalomalacia. There are numerous chronic supratentorial and
infratentorial microhemorrhages with greatest involvement of the
thalami suggestive of chronic hypertension. Many more
microhemorrhages are apparent on today's study compared to the prior
MRI due to the inclusion of susceptibility weighted imaging.

Patchy and confluent T2 hyperintensities in the cerebral white
matter are similar to the prior MRI and nonspecific but compatible
with severe chronic small vessel ischemic disease. Milder chronic
small vessel changes are present in the pons. A chronic infarct is
again seen involving the right corona radiata and basal ganglia, and
there also chronic lacunar infarcts in the thalami. No mass, midline
shift, or extra-axial fluid collection is identified. There is
moderate cerebral atrophy.

Vascular: Major intracranial vascular flow voids are preserved.

Skull and upper cervical spine: Unremarkable bone marrow signal.

Sinuses/Orbits: Unremarkable orbits. Paranasal sinuses and mastoid
air cells are clear.

Other: None.
IMPRESSION: 1. Acute subcentimeter white matter infarct in the right frontal
lobe.
2. Severe chronic small vessel ischemia with chronic lacunar
infarcts as above.
3. Numerous chronic microhemorrhages suggesting chronic
hypertension.

## 2020-12-11 IMAGING — CT CT HEAD WITHOUT CONTRAST
4 series · 16 of 47 positions shown, 18 images · non-contrast
Comparison: March 29, 2019

CLINICAL DATA: Chronic left-sided deficits with altered mental
status

EXAM:
CT HEAD WITHOUT CONTRAST
TECHNIQUE: Contiguous axial images were obtained from the base of the skull
through the vertex without intravenous contrast.

[Series 3: head without · axial · non-contrast · 0.42mm/px · z∈[-128,-8]mm · 7 of 34 slices shown, 9 images]
[im 5/34  brain]
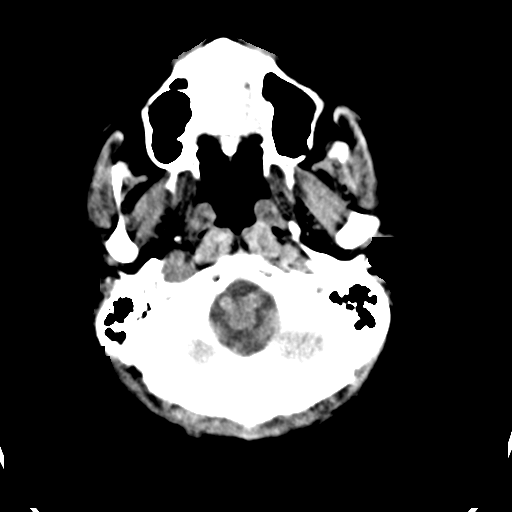
[im 5/34  bone]
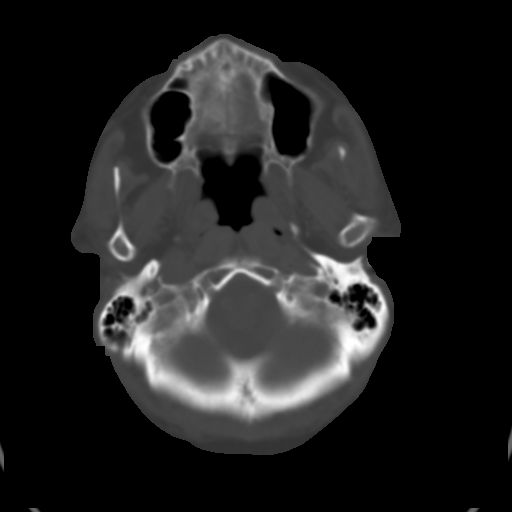
[im 9/34  brain]
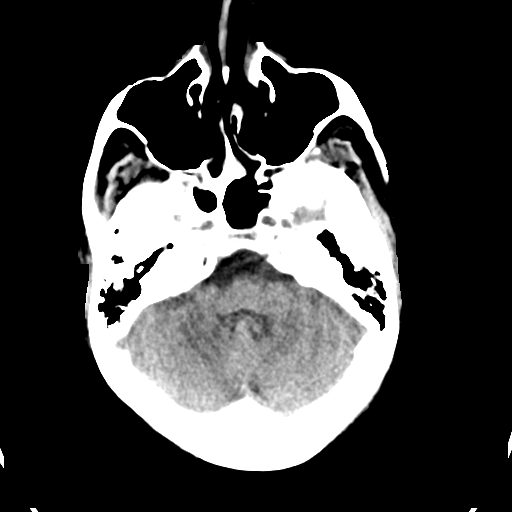
[im 13/34  brain]
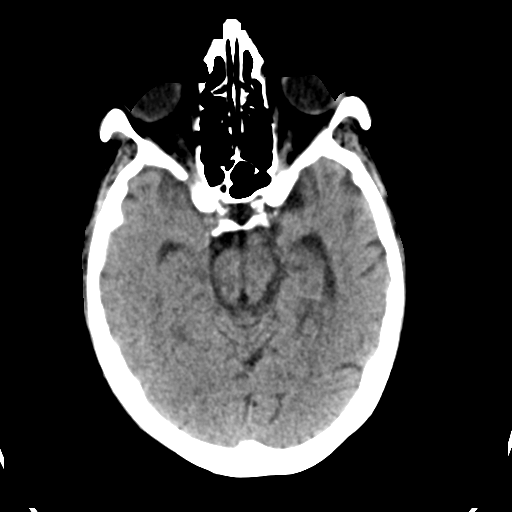
[im 17/34  brain]
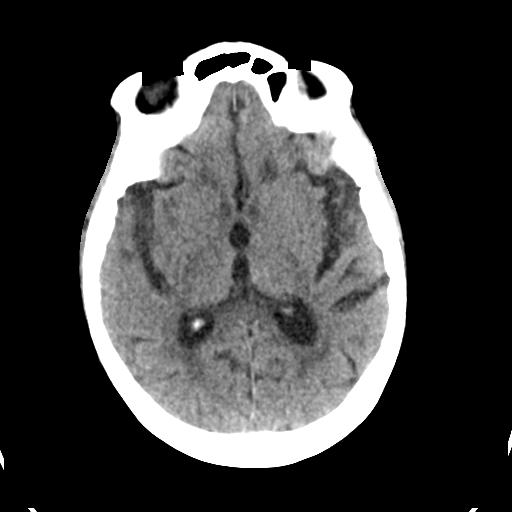
[im 21/34  brain]
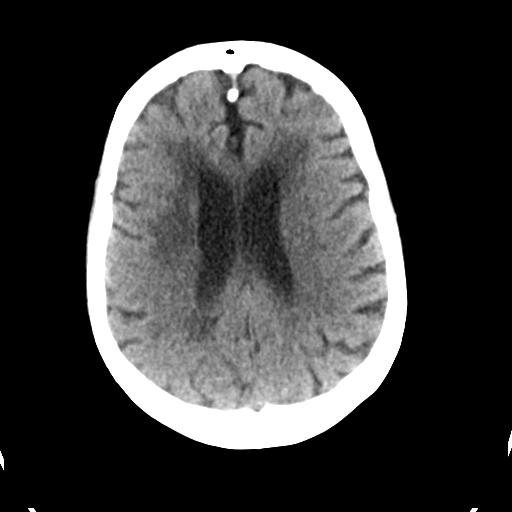
[im 21/34  bone]
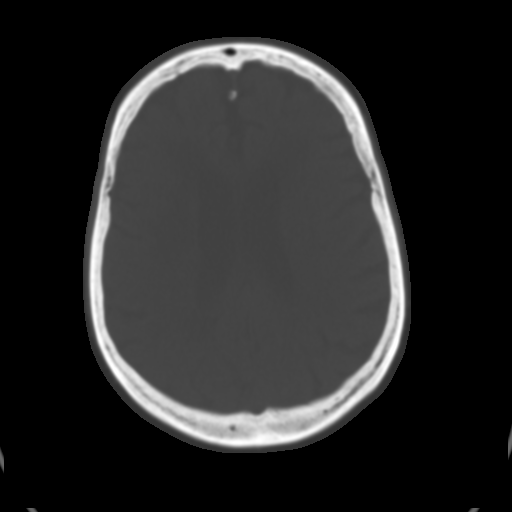
[im 25/34  brain]
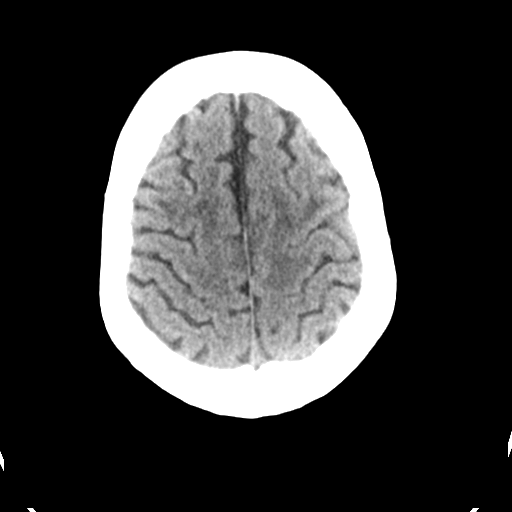
[im 29/34  brain]
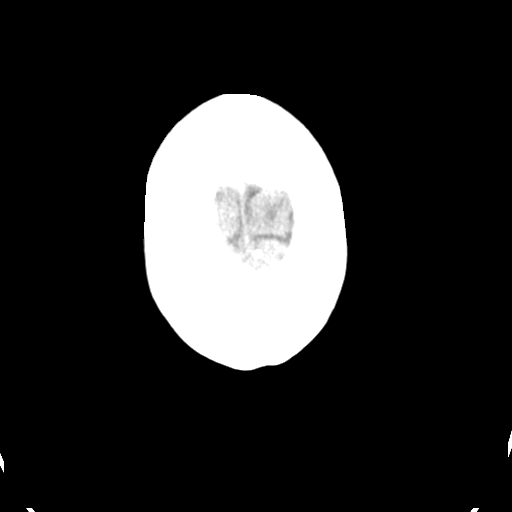

[Series 4: head bone · axial · 0.42mm/px · z∈[-132,-100]mm · 3 of 84 slices shown]
[im 9/84  bone]
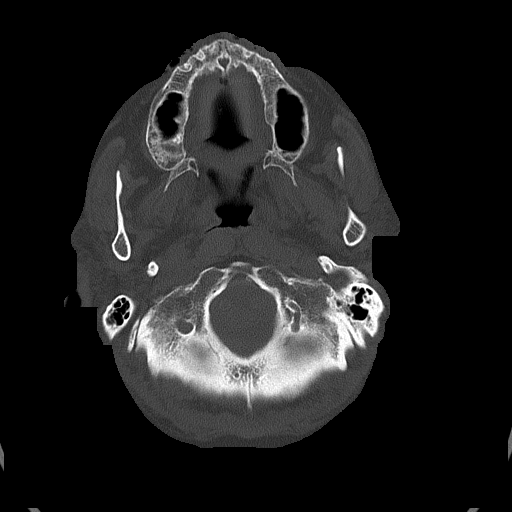
[im 17/84  bone]
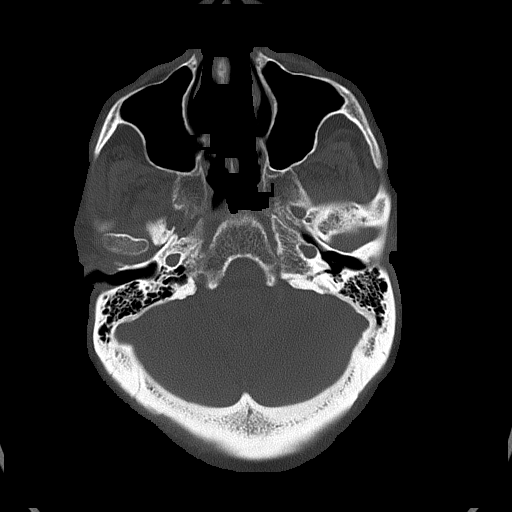
[im 25/84  bone]
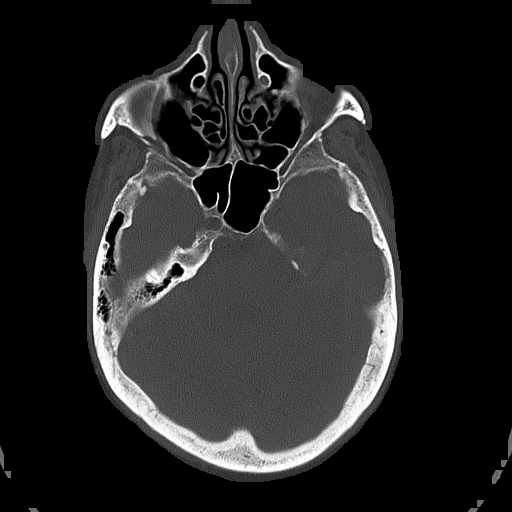

[Series 5: head without cor · coronal · non-contrast · 0.32mm/px · 3 of 67 slices shown]
[im 23/67  brain]
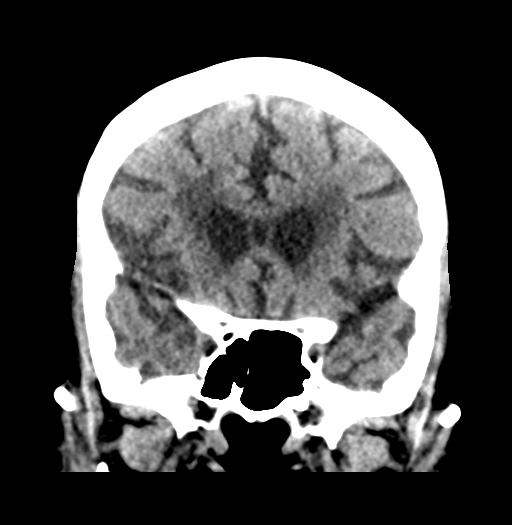
[im 30/67  brain]
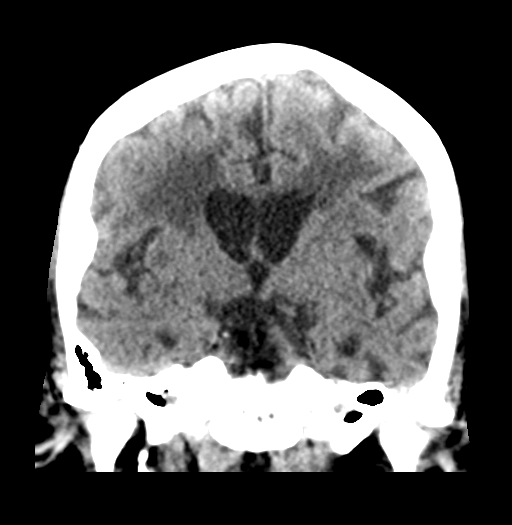
[im 37/67  brain]
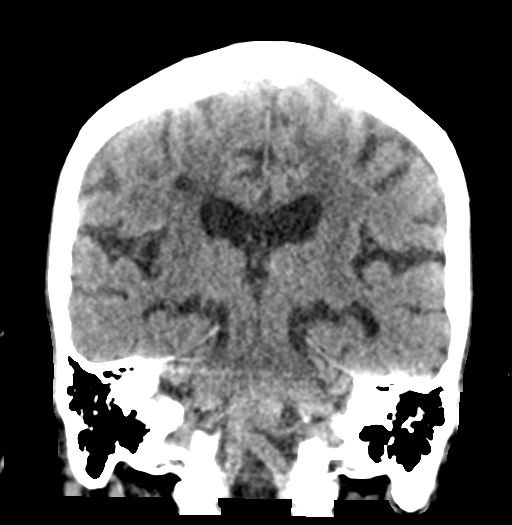

[Series 6: head without sag · sagittal · non-contrast · 0.33mm/px · 3 of 57 slices shown]
[im 19/57  brain]
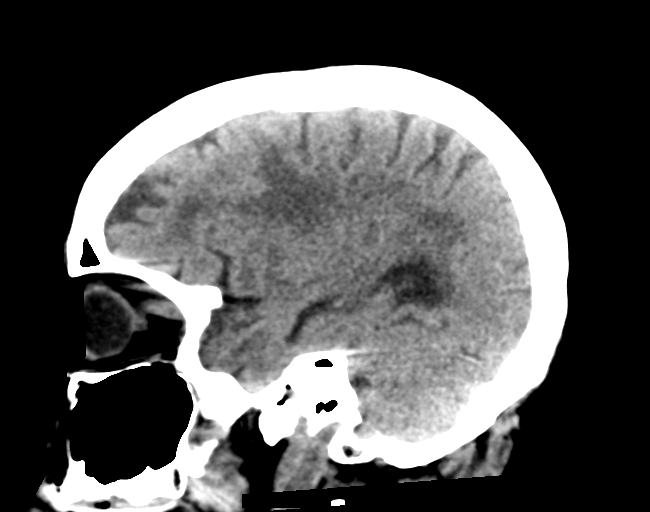
[im 29/57  brain]
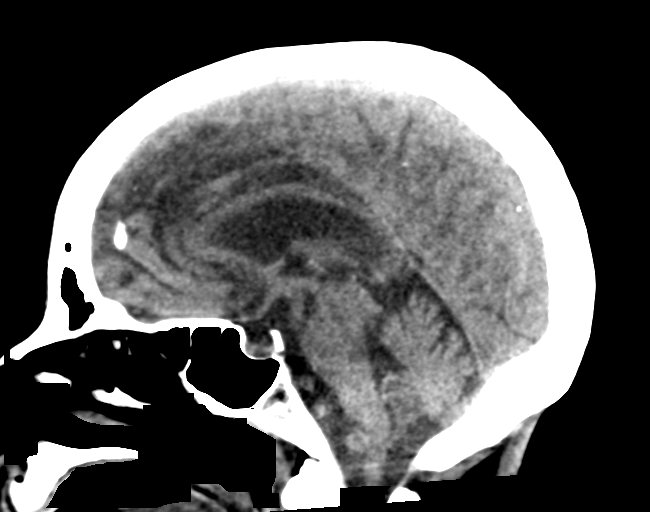
[im 38/57  brain]
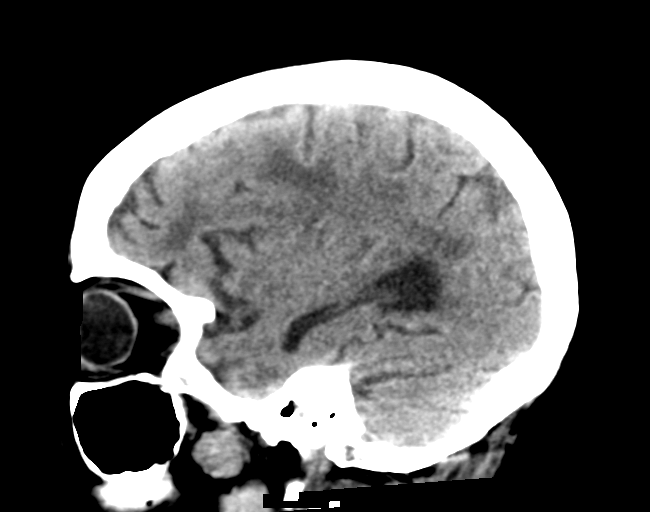

[16 of 47 positions shown; findings below may reference images not displayed]

FINDINGS: Brain: Mild diffuse atrophy is stable. There is no intracranial
mass, hemorrhage, extra-axial fluid collection, or midline shift.
There is extensive small vessel disease in the centra semiovale
bilaterally, stable. No acute infarct is demonstrable on this
current examination.

Vascular: There is no hyperdense vessel. There is calcification in
each carotid siphon region.

Skull: Bony calvarium appears intact.

Sinuses/Orbits: There is mucosal thickening in several ethmoid air
cells. Other paranasal sinuses are clear. There is rightward
deviation of the nasal septum. Orbits appear symmetric bilaterally.

Other: Mastoid air cells are clear.
IMPRESSION: Stable atrophy with periventricular small vessel disease. No acute
infarct evident. No mass or hemorrhage.

There are foci of arterial vascular calcification. There is mucosal
thickening in several ethmoid air cells. There is nasal septal
deviation.

## 2020-12-11 IMAGING — CR CHEST - 2 VIEW
2 series · 2 of 2 positions shown · non-contrast
Comparison: Single-view of the chest 03/30/2019. PA and lateral
chest 11/21/2018.

CLINICAL DATA: Generalized weakness today.

EXAM:
CHEST - 2 VIEW

[chest lat]
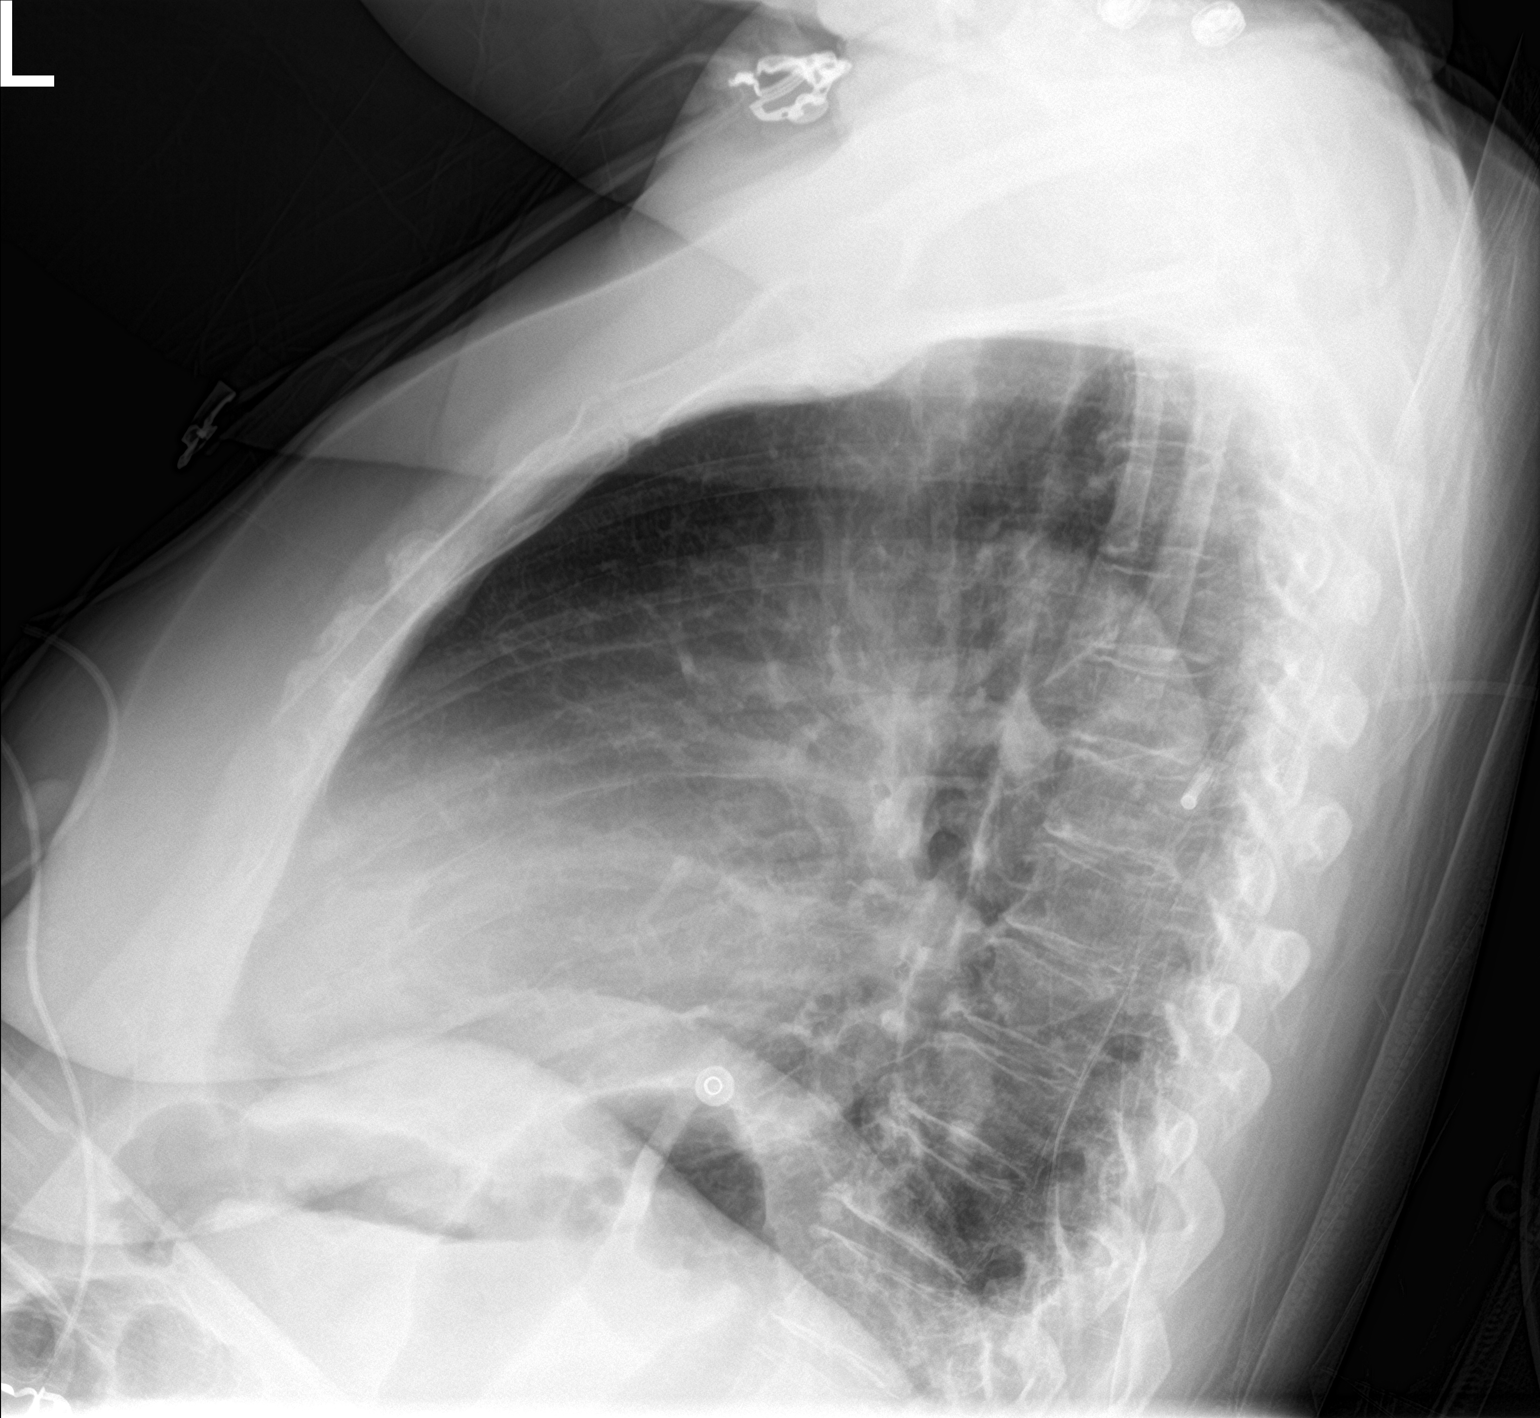

[chest ap]
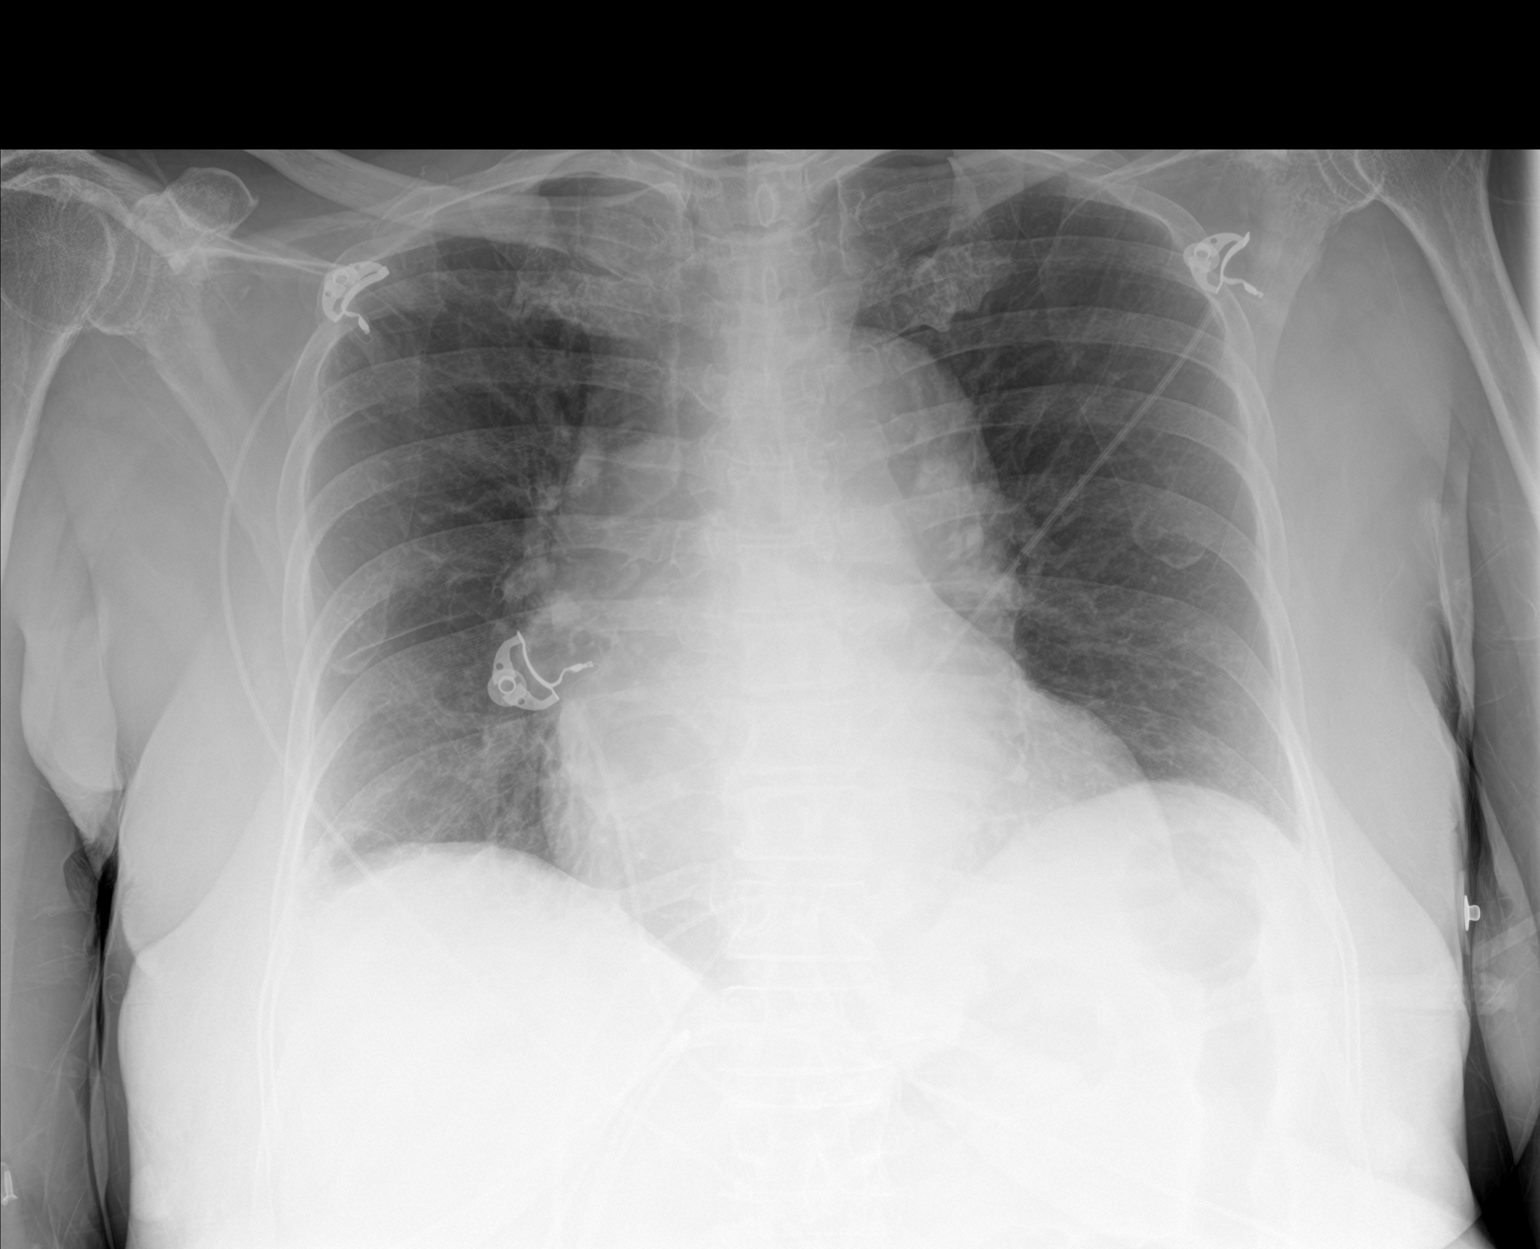

[2 of 2 positions shown; findings below may reference images not displayed]

FINDINGS: The lungs are clear. There is cardiomegaly. No pneumothorax or
pleural fluid.
IMPRESSION: Cardiomegaly without acute disease.

## 2020-12-11 MED ORDER — TAMSULOSIN HCL 0.4 MG PO CAPS: 0.4000 mg | ORAL_CAPSULE | Freq: Every day | ORAL | 0 refills | Status: DC

## 2020-12-11 MED ORDER — POLYETHYLENE GLYCOL 3350 17 G PO PACK: 17.0000 g | PACK | Freq: Every day | ORAL | Status: DC | PRN

## 2020-12-11 MED ORDER — MAGNESIUM SULFATE 2 GM/50ML IV SOLN
2.0000 g | Freq: Once | INTRAVENOUS | Status: AC
  Administered 2020-12-11: 2 g via INTRAVENOUS
  Filled 2020-12-11: qty 50

## 2020-12-11 NOTE — TOC Transition Note (Signed)
Transition of Care [facility]) - CM/SW Discharge Note   Patient Details  Name: Michelle Graves MRN: [id number] Date of Birth: 03-Nov-1950  Transition of Care [facility]) CM/SW Contact:  Bess Kinds, RN Phone Number: [phone] 12/11/2020, 12:53 PM   Clinical Narrative:     Patient to transition home today. HH PT order placed for resumption of services. [facility] notified of transition today. No further TOC needs identified at this time.   Final next level of care: Home w Home Health Services Barriers to Discharge: No Barriers Identified   Patient Goals and CMS Choice Patient states their goals for this hospitalization and ongoing recovery are:: return home CMS Medicare.gov Compare Post Acute Care list provided to:: Patient Choice offered to / list presented to : Patient  Discharge Placement                       Discharge Plan and Services In-house Referral: NA Discharge Planning Services: CM Consult Post Acute Care Choice: Home Health          DME Arranged: N/A DME Agency: NA       HH Arranged: PT HH Agency: [facility] Date [facility] Agency Contacted: 12/11/20 Time HH Agency Contacted: [phone] Representative spoke with at [facility] Agency: Kandee Keen  Social Determinants of Health (SDOH) Interventions     Readmission Risk Interventions Readmission Risk Prevention Plan 05/10/2020 05/10/2020 10/28/2019  Transportation Screening - Complete Complete  PCP or Specialist Appt within 3-5 Days - - -  Not Complete comments - - -  HRI or Home Care Consult - - -  HRI or Home Care Consult comments - - -  Social Work Consult for Recovery Care Planning/Counseling - - -  Palliative Care Screening - - -  Medication Review Oceanographer) - Referral to Pharmacy Referral to Pharmacy  PCP or Specialist appointment within 3-5 days of discharge Complete Complete Not Complete  PCP/Specialist Appt Not Complete comments - - due to holiday, pt to make appointment  HRI or Home Care Consult - Complete  Complete  SW Recovery Care/Counseling Consult - - Complete  Palliative Care Screening - Not Applicable Not Applicable  Skilled Nursing Facility - Patient Refused Complete  Some recent data might be hidden

## 2020-12-11 NOTE — Progress Notes (Signed)
DISCHARGE NOTE [facility] to be discharged Home per MD order. Patient verbalized understanding.  Skin clean, dry and intact without evidence of skin break down, no evidence of skin tears noted. IV catheter discontinued intact. Site without signs and symptoms of complications. Dressing and pressure applied. Pt denies pain at the site currently. No complaints noted.  Patient free of lines, drains, and wounds except indwelling foley catheter that MD order states patient will return home with foley catheter in place.  Discharge packet assembled. An After Visit Summary (AVS) was printed and given to the EMS personnel. Patient escorted via stretcher and discharged to [facility] via ambulance. Report called to accepting facility; all questions and concerns addressed.   Pat Patrick, RN

## 2020-12-11 NOTE — Consult Note (Signed)
   [facility] Inpatient Consult   12/11/2020  LATIKA KRONICK 24-Oct-1951 [id number]   [facility] [[facility]]  Accountable Care Organization [ACO] Patient: [employer]  Patient noted to have [employer] for her primary care provider, this is not a [facility] provider.  For questions,  Charlesetta Shanks, RN BSN CCM [facility]  [phone] business mobile phone Toll free office [phone]  Fax number: [phone] [email or web address]

## 2020-12-11 NOTE — Discharge Summary (Signed)
Physician Discharge Summary  Michelle Graves GGY:[id number] DOB: 1951-08-21 DOA: 12/08/2020  PCP: [facility]  Admit date: 12/08/2020 Discharge date: 12/11/2020  Admitted From: Home Disposition: Home  Recommendations for Outpatient Follow-up:  1. Follow up with PCP in 1 week with repeat CBC/BMP 2. Outpatient follow-up with [facility] at earliest convenience.  Please call the office to make an appointment. 3. Follow up in ED if symptoms worsen or new appear   Home Health: Home with PT Equipment/Devices: Foley catheter  Discharge Condition: Stable CODE STATUS: Full Diet recommendation: Heart healthy  Brief/Interim Summary: 70 year old female with history of rheumatoid arthritis, disorder, hypertension, UTIs, unspecified CVA, diverticular disease and recent diagnosis of colonic stricture treated medically, recent admission from 11/26/2020-12/01/2010 for AKI on CKD 3 along with colonic stricture requiring colonoscopy presented with weakness.  She was noted to have full bladder with evidence of obstructive uropathy with creatinine of 5.81.  Her creatinine during her recent discharge was 1.41 on 11/30/2020.  Foley catheter was placed.  She was started on IV fluids.  During the hospitalization, her condition improved; creatinine gradually started improving.  PT recommended home with PT.  Creatinine is 1.81 today.  She feels much better.  She will be discharged home today with home health PT with indwelling Foley catheter with outpatient follow-up with PCP and urology.   Discharge Diagnoses:   Acute kidney injury on chronic renal disease stage IIIb -Presented with creatinine of 5.81; creatinine on 11/30/2020 was 1.41. -Probably from obstructive uropathy.  Status post Foley catheter placement.  Renal ultrasound showed no evidence of hydronephrosis but showed distended bladder.    Treated with IV fluids.  Creatinine has much improved to 1.81 today.  She feels much  better and feels okay to go home today. - Will need outpatient urology follow-up at earliest convenience.  Patient will need to be discharged home with Foley catheter -We will start Flomax on discharge  Anemia of chronic disease -Probably from renal failure.  Hemoglobin stable.  Recent diagnosis of colonic stricture -Outpatient follow-up with GI  History of rheumatoid arthritis -Continue home regimen  Hypertension-blood pressure on the lower side.  Continue Coreg, clonidine, Cardizem and isosorbide mononitrate.  Lisinopril discontinued  Generalized deconditioning -PT recommends home health PT  Discharge Instructions  Discharge Instructions    Diet - low sodium heart healthy   Complete by: As directed    Increase activity slowly   Complete by: As directed      Allergies as of 12/11/2020   No Known Allergies     Medication List    STOP taking these medications   darifenacin 7.5 MG 24 hr tablet Commonly known as: ENABLEX   lisinopril 20 MG tablet Commonly known as: ZESTRIL   ondansetron 4 MG tablet Commonly known as: ZOFRAN     TAKE these medications   aspirin 81 MG EC tablet Take 81 mg by mouth daily.   carvedilol 25 MG tablet Commonly known as: COREG Take 25 mg by mouth 2 (two) times daily.   cloNIDine 0.3 MG tablet Commonly known as: CATAPRES Take 0.3 mg by mouth 2 (two) times daily.   diltiazem 180 MG 24 hr capsule Commonly known as: CARDIZEM CD Take 1 capsule (180 mg total) by mouth daily.   docusate sodium 100 MG capsule Commonly known as: COLACE Take 1 capsule (100 mg total) by mouth 2 (two) times daily.   DULoxetine 60 MG capsule Commonly known as: CYMBALTA Take 60 mg by mouth at  bedtime.   HYDROcodone-acetaminophen 5-325 MG tablet Commonly known as: NORCO/VICODIN Take 1 tablet by mouth 3 (three) times daily as needed for severe pain.   hydroxychloroquine 200 MG tablet Commonly known as: PLAQUENIL Take 200 mg by mouth daily.    isosorbide mononitrate 30 MG 24 hr tablet Commonly known as: IMDUR Take 30 mg by mouth daily.   lidocaine 2 % solution Commonly known as: XYLOCAINE Use as directed 15 mLs in the mouth or throat every 6 (six) hours as needed (for severe stomach pain not relieved with Maalox).   multivitamin with minerals Tabs tablet Take 1 tablet by mouth daily.   Myrbetriq 25 MG Tb24 tablet Generic drug: mirabegron ER Take 1 tablet (25 mg total) by mouth daily.   pantoprazole 40 MG tablet Commonly known as: PROTONIX Take 1 tablet (40 mg total) by mouth daily.   polyethylene glycol 17 g packet Commonly known as: MIRALAX / GLYCOLAX Take 17 g by mouth daily as needed for mild constipation.   predniSONE 5 MG tablet Commonly known as: DELTASONE Take 5 mg by mouth daily.   QUEtiapine 100 MG tablet Commonly known as: SEROQUEL Take 100 mg by mouth daily.   tamsulosin 0.4 MG Caps capsule Commonly known as: Flomax Take 1 capsule (0.4 mg total) by mouth daily after supper.       Follow-up Information    [facility] Follow up.   Specialty: Home Health Services Why: the office will call to schedule home health visits Contact information: [address] [place] [address] [phone]        [facility]. Schedule an appointment as soon as possible for a visit in 1 week(s).   Specialty: Family Medicine Why: With repeat CBC/BMP Contact information: [address] [place] [address] [phone]        [facility]. Schedule an appointment as soon as possible for a visit.   Why: At earliest convenience Contact information: [address] [place] [address] [place] [address] [phone]             No Known Allergies  Consultations:  None   Procedures/Studies: CT Abdomen Pelvis Wo Contrast  Result Date: 11/26/2020 CLINICAL DATA:  Diarrhea.  Generalized weakness.  Colonic  stricture. EXAM: CT ABDOMEN AND PELVIS WITHOUT CONTRAST TECHNIQUE: Multidetector CT imaging of the abdomen and pelvis was performed following the standard protocol without IV contrast. COMPARISON:  Virtual colonoscopy 02/15/2020, single-contrast barium enema 05/08/2020. FINDINGS: Lower chest: Mild scarring at the lung bases. Hepatobiliary: No focal hepatic lesion on noncontrast exam. Gallbladder normal Pancreas: Pancreas is normal. No ductal dilatation. No pancreatic inflammation. Spleen: Normal spleen Adrenals/urinary tract: Adrenal glands normal. Nephrolithiasis ureterolithiasis. Bladder normal Stomach/Bowel: Stomach, duodenum and small bowel normal. Terminal ileum is normal. Appendix not identified. There is fluid stool in the ascending colon. There is several large stool balls in the distal transverse colon leading up the splenic flexure. These have a chronic appearance concentric lamellations (image 34/3). There is a caliber change at the splenic flexure leading to collapsed descending colon. This caliber change present on image 20/3 of axial imaging. A stricturing is well seen on image 150/series 7/sagittal imaging. There several large stool ball again retained proximal to this stricturing of the splenic flexure. Several diverticula descending colon.  Fluid within the rectum. Vascular/Lymphatic: Abdominal aorta is normal caliber with atherosclerotic calcification. There is no retroperitoneal or periportal lymphadenopathy. No pelvic lymphadenopathy. Reproductive: Uterus and adnexa  unremarkable. Other: No free fluid or free air. Musculoskeletal: No aggressive osseous lesion. IMPRESSION: 1. 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 Three large chronic lamellated stool balls proximal to this stricturing. Differential includes benign and malignant stricturing of the colon. 2. No lymphadenopathy. Electronically Signed   By: Genevive Bi M.D.   On: 11/26/2020 14:52   DG Chest 2  View  Result Date: 11/22/2020 CLINICAL DATA:  Chest pain EXAM: CHEST - 2 VIEW COMPARISON:  September 26, 2020 FINDINGS: The heart size is stable. The aorta remains dilated as before. The lung volumes are low. Bibasilar airspace disease is noted favored to represent atelectasis. There is no pneumothorax. No acute osseous abnormality. IMPRESSION: Low lung volumes with bibasilar atelectasis. Electronically Signed   By: Katherine Mantle M.D.   On: 11/22/2020 02:02   US RENAL  Result Date: 12/08/2020 CLINICAL DATA:  Renal failure. EXAM: RENAL / URINARY TRACT ULTRASOUND COMPLETE COMPARISON:  None. FINDINGS: Right Kidney: Renal measurements: 8.5 x 4.5 x 4.7 cm = volume: 92.77 mL. Increased parenchymal echogenicity. Cortical thinning with some lobulation of the contour. No mass. No stone or hydronephrosis. Left Kidney: Renal measurements: 8.2 x 4.5 x 4.2 cm = volume: 82.1 mL. Increased parenchymal echogenicity. Renal cortical thinning. Lobulated contour. No mass, stone or hydronephrosis. Bladder: Distended.  Otherwise unremarkable. Other: None. IMPRESSION: 1. Findings consistent with medical renal disease with reduced renal sizes, increased renal parenchymal echogenicity and renal cortical thinning. No masses, stones or hydronephrosis. 2. Distended bladder. Electronically Signed   By: Amie Portland M.D.   On: 12/08/2020 17:07   DG Abdomen Acute W/Chest  Result Date: 12/08/2020 CLINICAL DATA:  Constipation. EXAM: DG ABDOMEN ACUTE WITH 1 VIEW CHEST COMPARISON:  CT scan 11/26/2020 FINDINGS: The upright chest x-ray demonstrates normal heart size and mild tortuosity of the thoracic aorta. No acute pulmonary findings. No pleural effusion. No pulmonary lesions. Two views of the abdomen demonstrate the moderate amount of stool in the right and proximal transverse colon. I do not see any significant stool in the left colon, sigmoid colon or rectum. No distended small bowel loops to suggest obstruction. The soft tissue  shadows are grossly maintained. No worrisome calcifications. No significant bony findings. IMPRESSION: 1. No acute cardiopulmonary findings. 2. Moderate stool in the right and proximal transverse colon. Electronically Signed   By: Rudie Meyer M.D.   On: 12/08/2020 16:57       Subjective: Patient seen and examined at bedside.  She feels much better and thinks that she is ready to go home today.  No overnight fever, vomiting, worsening shortness of breath reported.  Discharge Exam: Vitals:   12/11/20 0838 12/11/20 0938  BP: 134/80 131/88  Pulse: 64 66  Resp:  17  Temp:  98 F (36.7 C)  SpO2:  98%    General: Pt is alert, awake, not in acute distress.  Currently on room air. Foley catheter present Cardiovascular: rate controlled, S1/S2 + Respiratory: bilateral decreased breath sounds at bases with some scattered crackles Abdominal: Soft, mildly tender in the lower quadrant, ND, bowel sounds + Extremities: Trace lower extremity edema; no cyanosis    The results of significant diagnostics from this hospitalization (including imaging, microbiology, ancillary and laboratory) are listed below for reference.     Microbiology: Recent Results (from the past 240 hour(s))  SARS CORONAVIRUS 2 (TAT 6-24 HRS) Nasopharyngeal Nasopharyngeal Swab     Status: None   Collection Time: 12/08/20  4:03 PM   Specimen: Nasopharyngeal  Swab  Result Value Ref Range Status   SARS Coronavirus 2 NEGATIVE NEGATIVE Final    Comment: (NOTE) SARS-CoV-2 target nucleic acids are NOT DETECTED.  The SARS-CoV-2 RNA is generally detectable in upper and lower respiratory specimens during the acute phase of infection. Negative results do not preclude SARS-CoV-2 infection, do not rule out co-infections with other pathogens, and should not be used as the sole basis for treatment or other patient management decisions. Negative results must be combined with clinical observations, patient history, and  epidemiological information. The expected result is Negative.  Fact Sheet for Patients: HairSlick.no  Fact Sheet for Healthcare Providers: quierodirigir.com  This test is not yet approved or cleared by the [place] FDA and  has been authorized for detection and/or diagnosis of SARS-CoV-2 by FDA under an Emergency Use Authorization (EUA). This EUA will remain  in effect (meaning this test can be used) for the duration of the COVID-19 declaration under Se ction 564(b)(1) of the Act, 21 U.S.C. section 360bbb-3(b)(1), unless the authorization is terminated or revoked sooner.  Performed at [facility], [address], [place], [place] [address]   Urine Culture     Status: None   Collection Time: 12/08/20  6:37 PM   Specimen: Urine, Catheterized  Result Value Ref Range Status   Specimen Description URINE, CATHETERIZED  Final   Special Requests NONE  Final   Culture   Final    NO GROWTH Performed at [facility], [address], [place], [place] [address]    Report Status 12/09/2020 FINAL  Final     Labs: BNP (last 3 results) Recent Labs    05/08/20 0215 05/09/20 0407  BNP 215.7* 173.4*   Basic Metabolic Panel: Recent Labs  Lab 12/08/20 1555 12/09/20 0451 12/10/20 0747 12/11/20 0755  NA 138 139 138 139  K 4.2 4.6 4.6 4.7  CL 103 108 110 113*  CO2 23 21* 19* 18*  GLUCOSE 91 155* 116* 117*  BUN 82* 69* 47* 33*  CREATININE 5.81* 4.08* 2.41* 1.81*  CALCIUM 9.5 8.9 8.6* 8.4*  MG  --   --   --  1.4*   Liver Function Tests: Recent Labs  Lab 12/09/20 0451  AST 12*  ALT 13  ALKPHOS 62  BILITOT 0.7  PROT 5.7*  ALBUMIN 2.9*   No results for input(s): LIPASE, AMYLASE in the last 168 hours. No results for input(s): AMMONIA in the last 168 hours. CBC: Recent Labs  Lab 12/08/20 1555 12/09/20 0451 12/10/20 0747 12/11/20 0755  WBC 10.3 8.1 8.3 8.3  NEUTROABS 6.5  --  5.0 4.3  HGB 10.3* 10.1*  9.5* 9.2*  HCT 32.5* 30.9* 30.0* 30.1*  MCV 95.6 94.5 96.2 97.7  PLT 169 166 163 181   Cardiac Enzymes: No results for input(s): CKTOTAL, CKMB, CKMBINDEX, TROPONINI in the last 168 hours. BNP: Invalid input(s): POCBNP CBG: Recent Labs  Lab 12/10/20 1127 12/10/20 2105 12/11/20 0637  GLUCAP 122* 171* 110*   D-Dimer No results for input(s): DDIMER in the last 72 hours. Hgb A1c No results for input(s): HGBA1C in the last 72 hours. Lipid Profile No results for input(s): CHOL, HDL, LDLCALC, TRIG, CHOLHDL, LDLDIRECT in the last 72 hours. Thyroid function studies No results for input(s): TSH, T4TOTAL, T3FREE, THYROIDAB in the last 72 hours.  Invalid input(s): FREET3 Anemia work up No results for input(s): VITAMINB12, FOLATE, FERRITIN, TIBC, IRON, RETICCTPCT in the last 72 hours. Urinalysis    Component Value Date/Time  COLORURINE YELLOW 12/08/2020 1859   APPEARANCEUR CLEAR 12/08/2020 1859   LABSPEC >1.030 (H) 12/08/2020 1859   PHURINE 5.5 12/08/2020 1859   GLUCOSEU NEGATIVE 12/08/2020 1859   HGBUR NEGATIVE 12/08/2020 1859   BILIRUBINUR NEGATIVE 12/08/2020 1859   KETONESUR NEGATIVE 12/08/2020 1859   PROTEINUR NEGATIVE 12/08/2020 1859   UROBILINOGEN 1.0 06/24/2010 1850   NITRITE NEGATIVE 12/08/2020 1859   LEUKOCYTESUR NEGATIVE 12/08/2020 1859   Sepsis Labs Invalid input(s): PROCALCITONIN,  WBC,  LACTICIDVEN Microbiology Recent Results (from the past 240 hour(s))  SARS CORONAVIRUS 2 (TAT 6-24 HRS) Nasopharyngeal Nasopharyngeal Swab     Status: None   Collection Time: 12/08/20  4:03 PM   Specimen: Nasopharyngeal Swab  Result Value Ref Range Status   SARS Coronavirus 2 NEGATIVE NEGATIVE Final    Comment: (NOTE) SARS-CoV-2 target nucleic acids are NOT DETECTED.  The SARS-CoV-2 RNA is generally detectable in upper and lower respiratory specimens during the acute phase of infection. Negative results do not preclude SARS-CoV-2 infection, do not rule out co-infections with  other pathogens, and should not be used as the sole basis for treatment or other patient management decisions. Negative results must be combined with clinical observations, patient history, and epidemiological information. The expected result is Negative.  Fact Sheet for Patients: HairSlick.no  Fact Sheet for Healthcare Providers: quierodirigir.com  This test is not yet approved or cleared by the [place] FDA and  has been authorized for detection and/or diagnosis of SARS-CoV-2 by FDA under an Emergency Use Authorization (EUA). This EUA will remain  in effect (meaning this test can be used) for the duration of the COVID-19 declaration under Se ction 564(b)(1) of the Act, 21 U.S.C. section 360bbb-3(b)(1), unless the authorization is terminated or revoked sooner.  Performed at [facility], [address] [place], [place] [address]   Urine Culture     Status: None   Collection Time: 12/08/20  6:37 PM   Specimen: Urine, Catheterized  Result Value Ref Range Status   Specimen Description URINE, CATHETERIZED  Final   Special Requests NONE  Final   Culture   Final    NO GROWTH Performed at [facility], [address], [place], [place] [address]    Report Status 12/09/2020 FINAL  Final     Time coordinating discharge: 35 minutes  SIGNED:   Glade Lloyd, MD  [facility] 12/11/2020, 10:17 AM

## 2020-12-12 IMAGING — MR MRI THORACIC SPINE WITHOUT CONTRAST
5 of 13 series · 19 of 48 positions shown · non-contrast
Comparison: CT chest 03/26/2019

CLINICAL DATA: Myelopathy.

EXAM:
MRI THORACIC SPINE WITHOUT CONTRAST
TECHNIQUE: Multiplanar, multisequence MR imaging of the thoracic spine was
performed. No intravenous contrast was administered.

[Series 3: T2 · sagittal · 3.0mm · 0.66mm/px · 2 of 18 slices shown (1 of 5)]
[im 1/18]
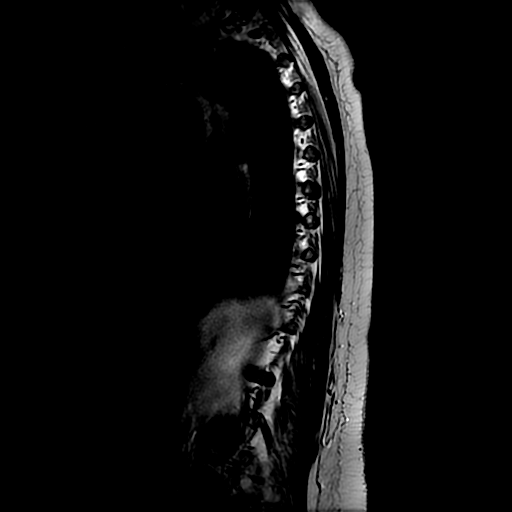
[im 18/18]
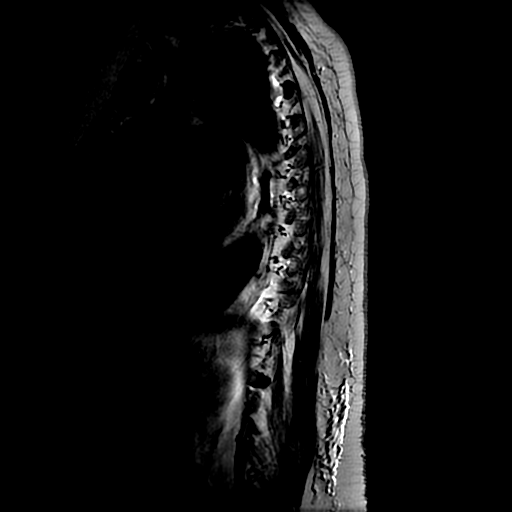

[Series 6: T2 · axial · 4.0mm · 0.39mm/px · z∈[-157,-6]mm · 5 of 29 slices shown (2 of 5)]
[im 1/29]
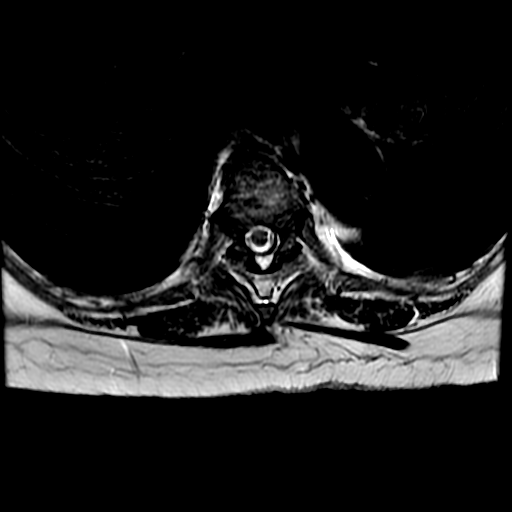
[im 8/29]
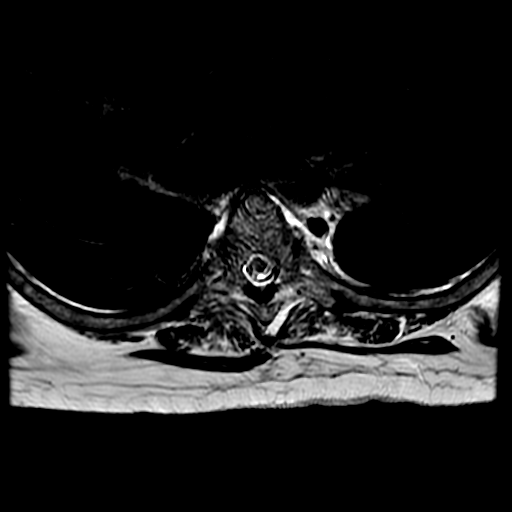
[im 15/29]
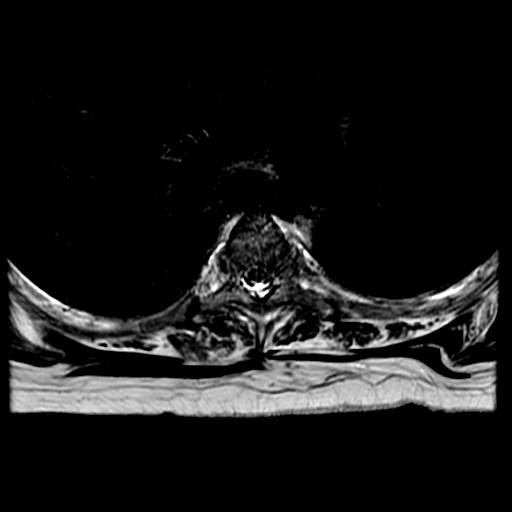
[im 22/29]
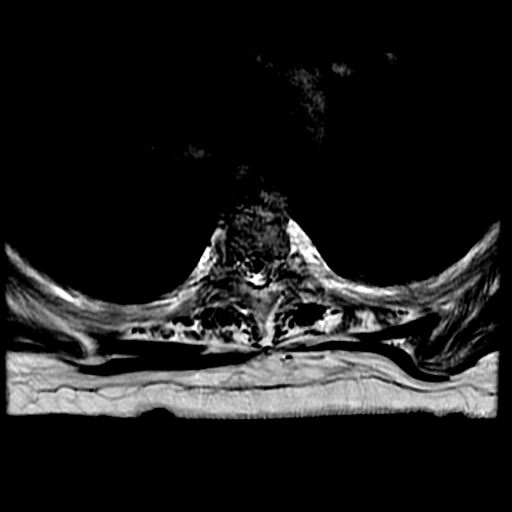
[im 29/29]
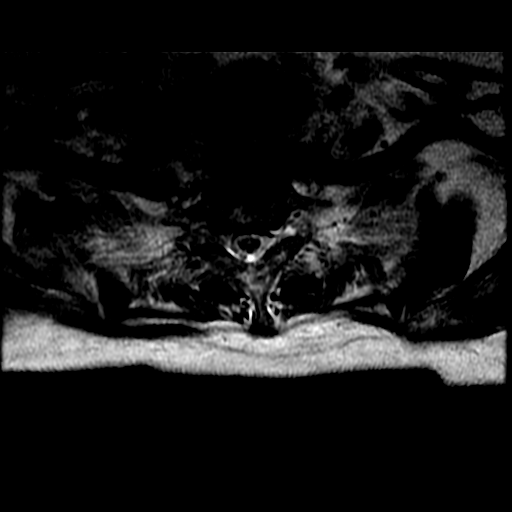

[Series 9: T2 · axial · 4.0mm · 0.39mm/px · z∈[-233,-109]mm · 4 of 24 slices shown (3 of 5)]
[im 1/24]
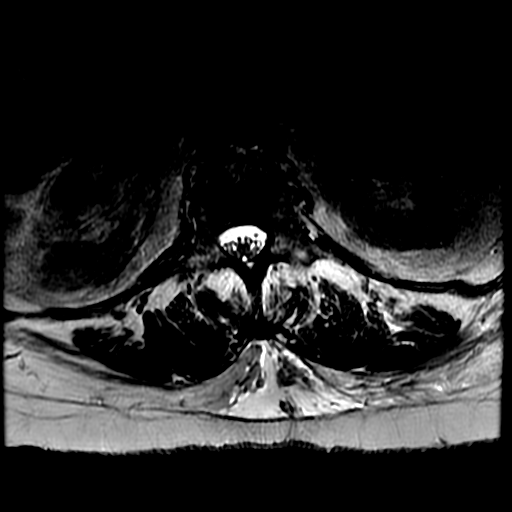
[im 8/24]
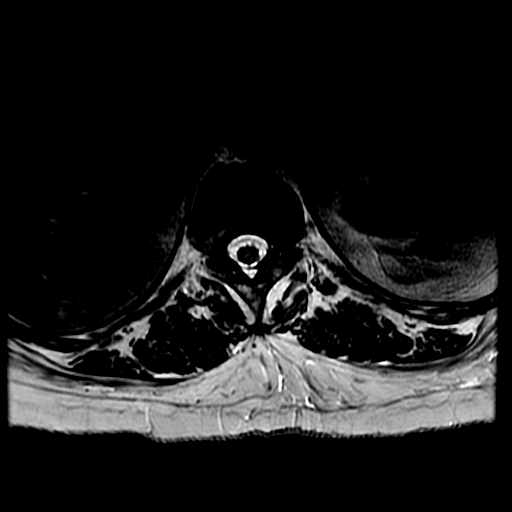
[im 16/24]
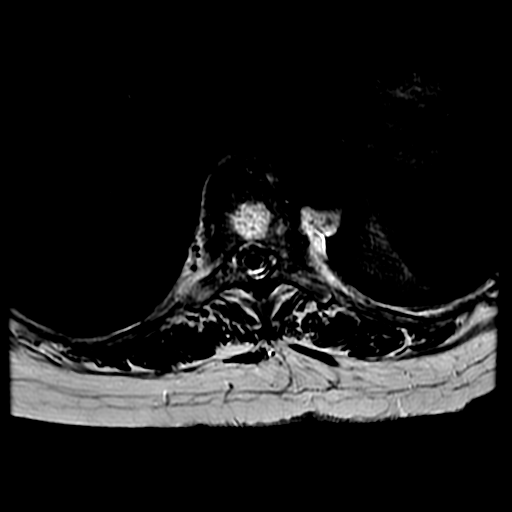
[im 24/24]
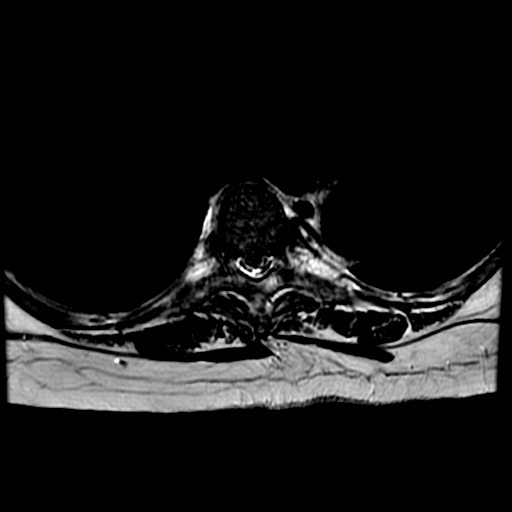

[Series 11: T2 · sagittal · 4.0mm · 0.55mm/px · 3 of 16 slices shown (4 of 5)]
[im 1/16]
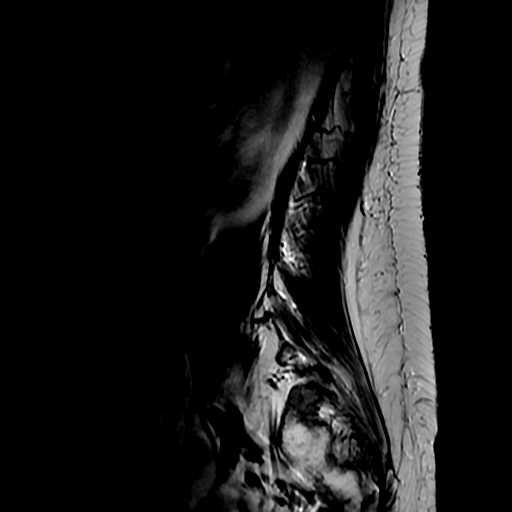
[im 8/16]
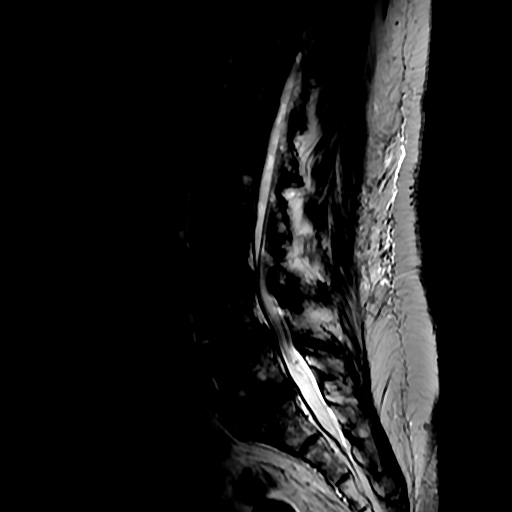
[im 16/16]
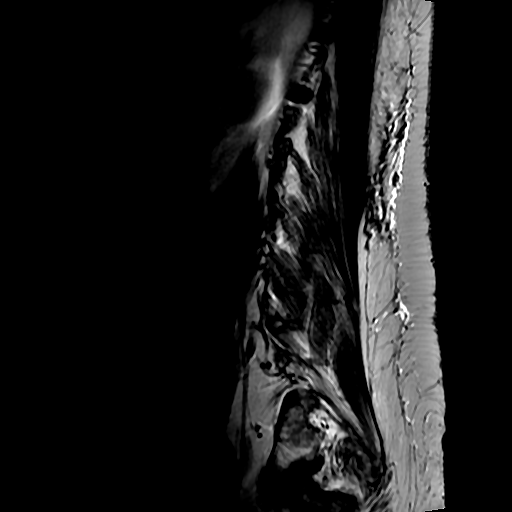

[Series 14: T2 · axial · 4.0mm · 0.39mm/px · z∈[-450,-277]mm · 5 of 36 slices shown (5 of 5)]
[im 1/36]
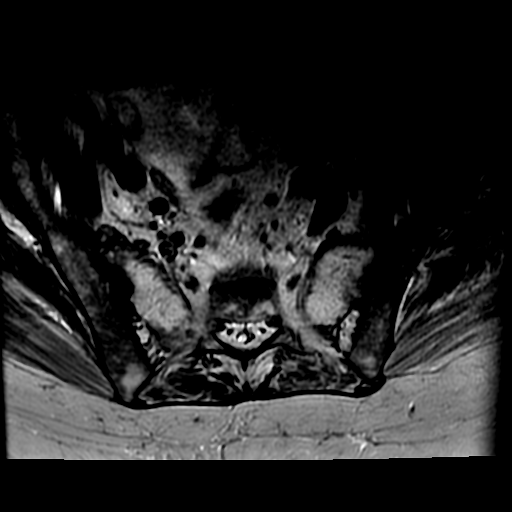
[im 8/36]
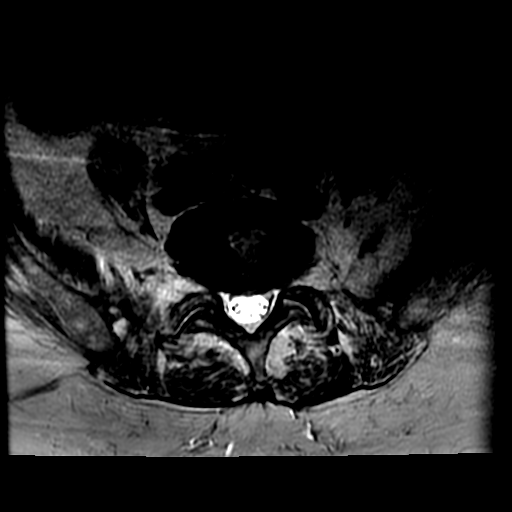
[im 15/36]
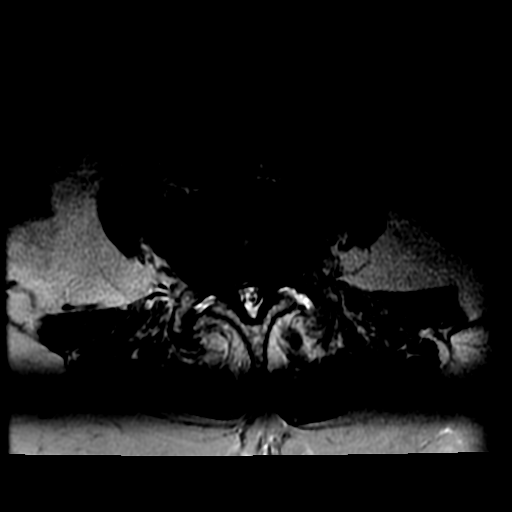
[im 22/36]
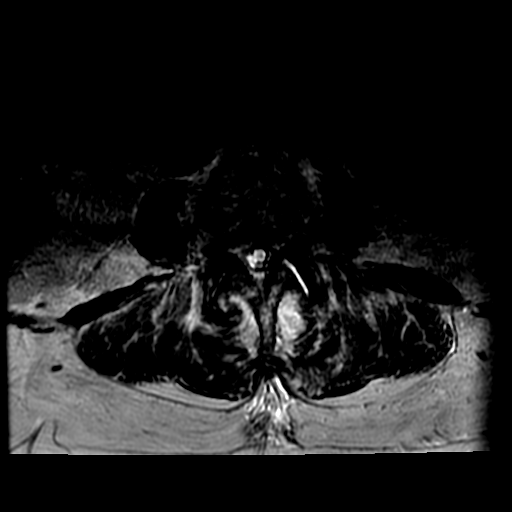
[im 29/36]
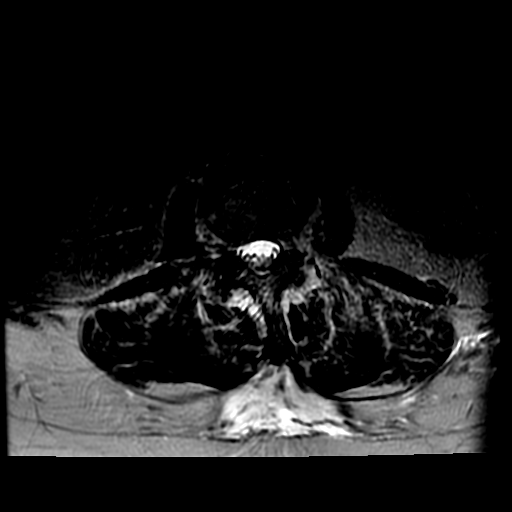

[19 of 48 positions shown; findings below may reference images not displayed]

FINDINGS: Alignment:  Mild curvature convex to the left.

Vertebrae: No fracture or significant lesion. Benign hemangioma
within the superior T10 vertebral body.

Cord: No cord compression or primary cord lesion. Imaging is
somewhat degraded by motion.

Paraspinal and other soft tissues: Negative

Disc levels:

No significant disc pathology. Disc bulge at T6-7 without neural
compression. Canal and foramina are widely patent throughout the
thoracic region.
IMPRESSION: No significant thoracic region finding. No evidence of cord
compression or primary cord lesion. No spinal stenosis or neural
compression.

## 2020-12-13 ENCOUNTER — Encounter (HOSPITAL_COMMUNITY): Payer: Self-pay | Admitting: Emergency Medicine

## 2020-12-13 ENCOUNTER — Emergency Department (HOSPITAL_COMMUNITY)
Admission: EM | Admit: 2020-12-13 | Discharge: 2020-12-13 | Disposition: A | Discharge status: Home / Self Care | Payer: Medicare Other | Attending: Emergency Medicine | Admitting: Emergency Medicine

## 2020-12-13 ENCOUNTER — Other Ambulatory Visit: Payer: Self-pay

## 2020-12-13 DIAGNOSIS — Z8673 Personal history of transient ischemic attack (TIA), and cerebral infarction without residual deficits: Secondary | ICD-10-CM | POA: Diagnosis not present

## 2020-12-13 DIAGNOSIS — Z978 Presence of other specified devices: Secondary | ICD-10-CM

## 2020-12-13 DIAGNOSIS — Z79899 Other long term (current) drug therapy: Secondary | ICD-10-CM | POA: Diagnosis not present

## 2020-12-13 DIAGNOSIS — N183 Chronic kidney disease, stage 3 unspecified: Secondary | ICD-10-CM | POA: Diagnosis not present

## 2020-12-13 DIAGNOSIS — R350 Frequency of micturition: Secondary | ICD-10-CM | POA: Insufficient documentation

## 2020-12-13 DIAGNOSIS — Z7982 Long term (current) use of aspirin: Secondary | ICD-10-CM | POA: Diagnosis not present

## 2020-12-13 DIAGNOSIS — I129 Hypertensive chronic kidney disease with stage 1 through stage 4 chronic kidney disease, or unspecified chronic kidney disease: Secondary | ICD-10-CM | POA: Insufficient documentation

## 2020-12-13 LAB — URINALYSIS, ROUTINE W REFLEX MICROSCOPIC
Bilirubin Urine: NEGATIVE
Glucose, UA: NEGATIVE mg/dL
Hgb urine dipstick: NEGATIVE
Ketones, ur: NEGATIVE mg/dL
Leukocytes,Ua: NEGATIVE
Nitrite: NEGATIVE
Protein, ur: NEGATIVE mg/dL
Specific Gravity, Urine: 1.014 (ref 1.005–1.030)
pH: 5 (ref 5.0–8.0)

## 2020-12-13 IMAGING — DX LEFT ANKLE COMPLETE - 3+ VIEW
3 series · 3 of 3 positions shown · non-contrast
Comparison: None.

CLINICAL DATA: 68-year-old who sustained a LEFT ankle injury when
getting out of the bed yesterday. DORSAL foot and ankle pain.

EXAM:
LEFT ANKLE COMPLETE - 3+ VIEW

[ankle ap]
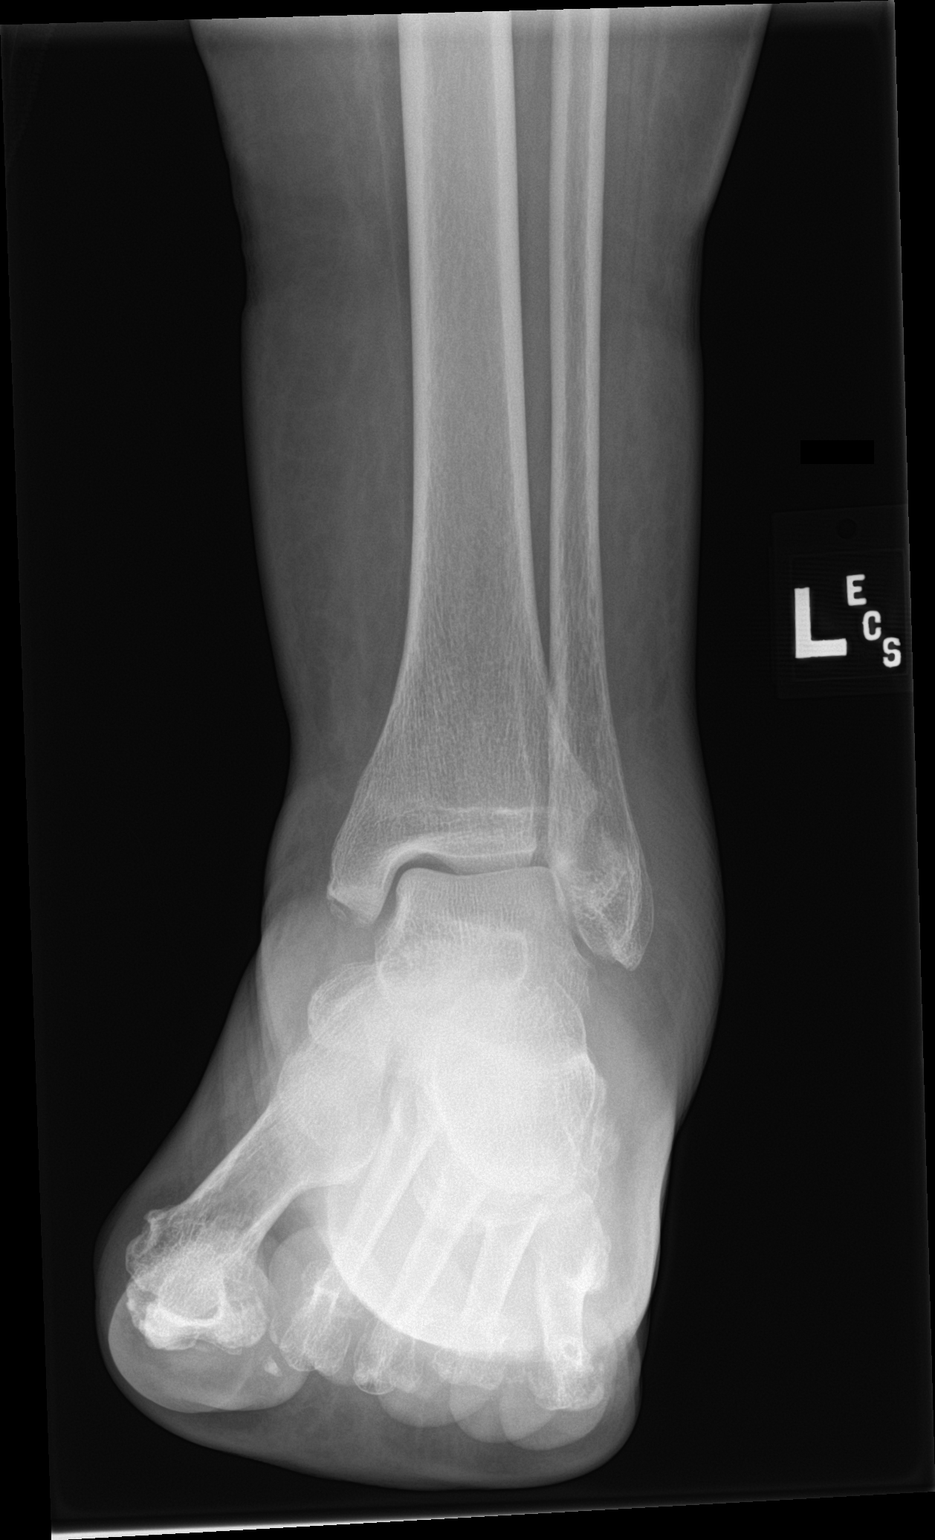

[ankle obl]
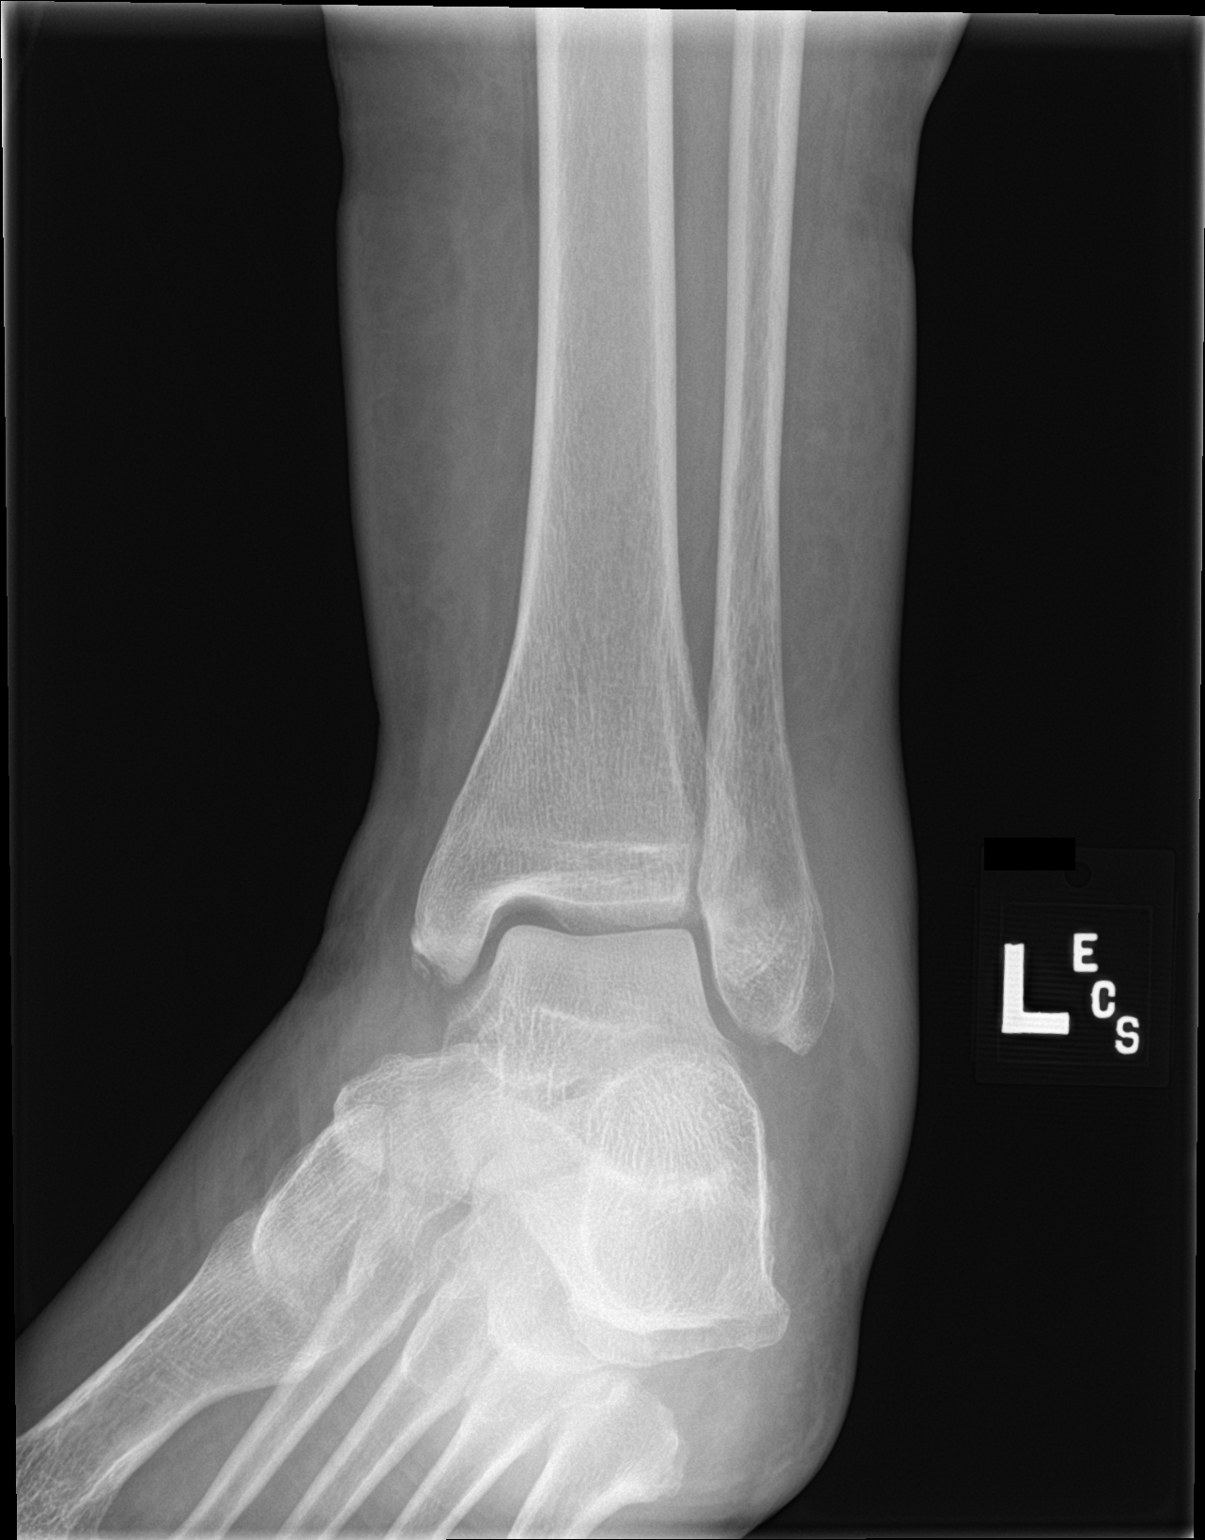

[ankle lat]
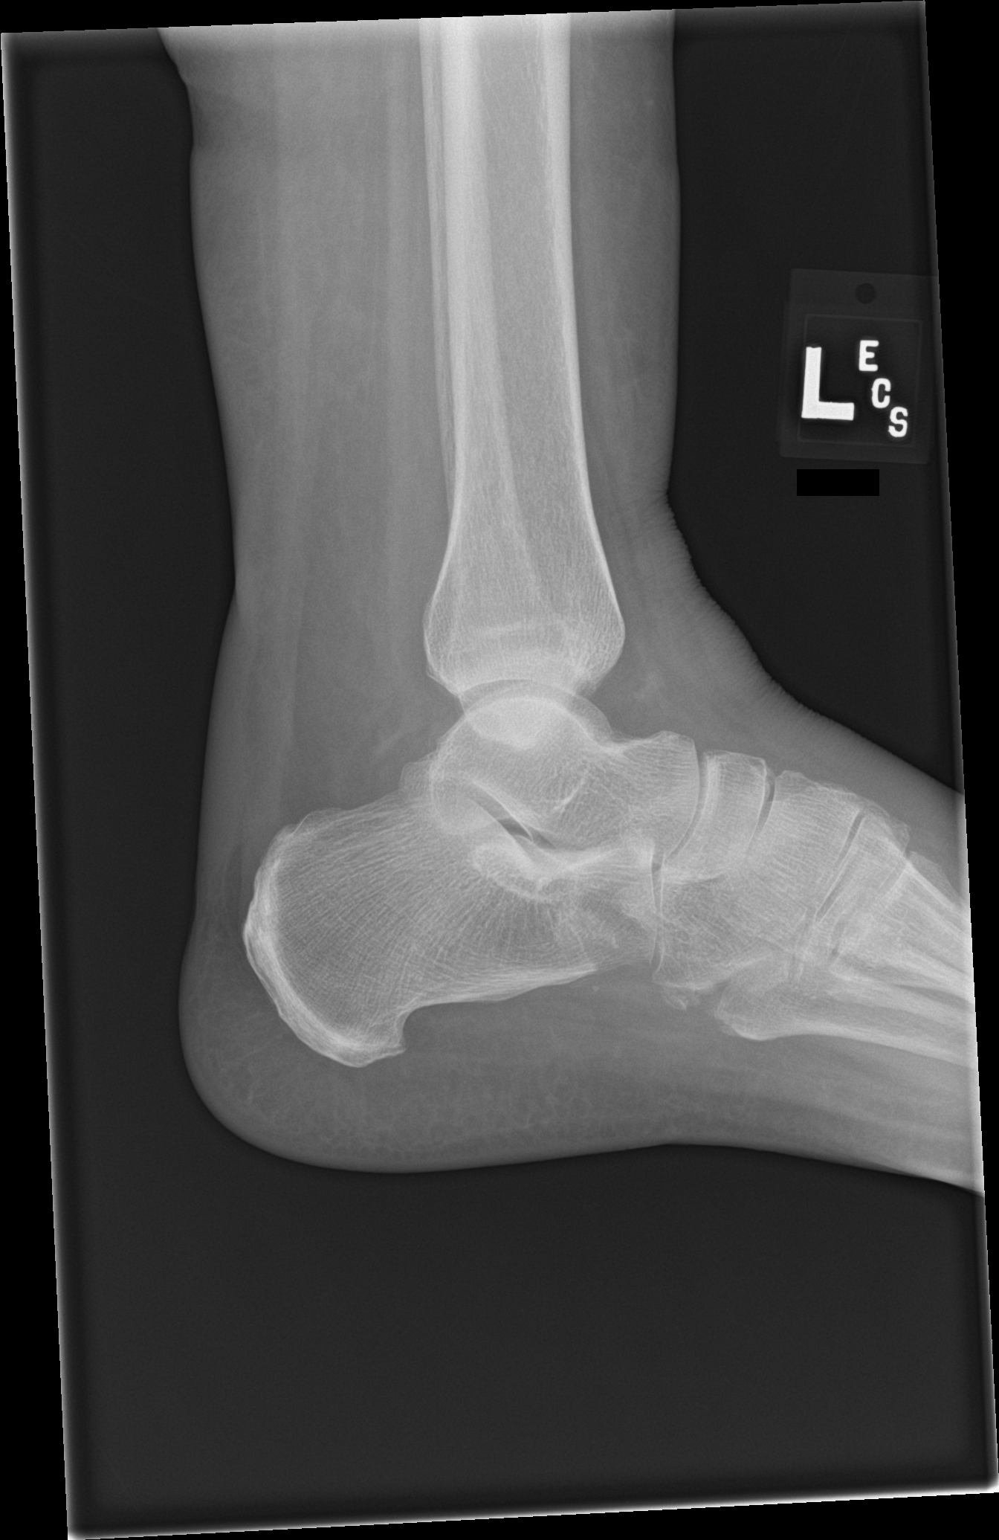

[3 of 3 positions shown; findings below may reference images not displayed]

FINDINGS: Diffuse soft tissue swelling. No evidence of acute fracture or
dislocation. Tibiotalar joint anatomically aligned with
well-preserved joint space. Accessory ossicle adjacent to the tip of
the MEDIAL malleolus versus a remote injury with dystrophic
calcification. Small ankle joint effusion suspected.
IMPRESSION: No acute osseous abnormality.

## 2020-12-13 NOTE — ED Notes (Signed)
Patient given discharge instructions. Questions were answered. Patient verbalized understanding of discharge instructions and care at home.  Pt discharged with family.

## 2020-12-13 NOTE — ED Provider Notes (Signed)
[facility] EMERGENCY DEPARTMENT Provider Note   CSN: [id number] Arrival date & time: 12/13/20  4098     History Chief Complaint  Patient presents with  . Follow-up    Michelle Graves is a 70 y.o. female presenting for evaluation of urinary frequency.  Patient states she was recently admitted to the hospital with kidney injury and a Foley was placed.  After discharge, she did not like the Foley, and thus removed to herself.  She did not complete it before removing.  She had 24 hours of urinary frequency, however over the past 12 hours, urination has returned to her baseline.  No dysuria or hematuria.  No vaginal bleeding.  She denies fevers, chills, nausea, vomiting, abdominal pain.  She has a follow-up appointment with urology next week. She reports mild discomfort of the urethra when walking.   HPI     Past Medical History:  Diagnosis Date  . Acute on chronic kidney failure (HCC)   . Anemia   . Arthritis   . Chest pain   . Diverticulitis   . Hypertension   . Stroke (HCC)   . UTI (urinary tract infection) 10/2019    Patient Active Problem List   Diagnosis Date Noted  . 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 Bowel obstruction (HCC) 05/06/2020  . SIRS (systemic inflammatory response syndrome) (HCC) 05/06/2020  . UTI (urinary tract infection) 11/08/2019  . Major depressive disorder, recurrent episode, moderate (HCC) 11/08/2019  . Urinary retention 10/28/2019  . Acute on chronic kidney failure (HCC) 10/27/2019  . Malnutrition of moderate degree 10/03/2019  . Colitis 09/30/2019  . AKI (acute kidney injury) (HCC) 05/28/2019  . Nausea & vomiting 05/28/2019  . 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 Anemia, chronic disease 05/28/2019  . Swelling   . Small vessel disease, cerebrovascular   . Acute ischemic stroke (HCC)   . Weakness   . Acute left ankle pain   . Acute on chronic renal failure (HCC) 05/13/2019  . Weakness of both lower extremities 05/13/2019   . Cerebral thrombosis with cerebral infarction 03/31/2019  . New onset a-fib (HCC) 03/27/2019  . Acute kidney injury superimposed on CKD (HCC) 03/24/2019  . Hypotension 03/24/2019  . Hematemesis 03/24/2019  . Dark stools 03/24/2019  . Diverticulitis of intestine without perforation or abscess without bleeding   . Sinus tachycardia 08/27/2016  . Diverticulitis 08/26/2016  . HYPERTENSION, MALIGNANT ESSENTIAL 06/24/2007  . KIDNEY DISEASE, CHRONIC, STAGE III 06/24/2007  . Rheumatoid arthritis (HCC) 06/24/2007    Past Surgical History:  Procedure Laterality Date  . BIOPSY  04/01/2019   Procedure: BIOPSY;  Surgeon: Kathi Der, MD;  Location: [facility] ENDOSCOPY;  Service: Gastroenterology;;  . BIOPSY  10/05/2019   Procedure: BIOPSY;  Surgeon: Kathi Der, MD;  Location: [facility] ENDOSCOPY;  Service: Gastroenterology;;  . BIOPSY  05/10/2020   Procedure: BIOPSY;  Surgeon: Kathi Der, MD;  Location: [facility] ENDOSCOPY;  Service: Gastroenterology;;  . BIOPSY  11/29/2020   Procedure: BIOPSY;  Surgeon: Charlott Rakes, MD;  Location: [facility] ENDOSCOPY;  Service: Gastroenterology;;  . COLONOSCOPY N/A 11/29/2020   Procedure: COLONOSCOPY;  Surgeon: Charlott Rakes, MD;  Location: [facility] ENDOSCOPY;  Service: Gastroenterology;  Laterality: N/A;  . COLONOSCOPY WITH PROPOFOL N/A 04/01/2019   Procedure: COLONOSCOPY WITH PROPOFOL;  Surgeon: Kathi Der, MD;  Location: [facility] ENDOSCOPY;  Service: Gastroenterology;  Laterality: N/A;  . COLONOSCOPY WITH PROPOFOL N/A 10/05/2019   Procedure: COLONOSCOPY WITH PROPOFOL;  Surgeon: Kathi Der, MD;  Location: [facility] ENDOSCOPY;  Service: Gastroenterology;  Laterality: N/A;  . COLONOSCOPY WITH PROPOFOL N/A 05/10/2020   Procedure: COLONOSCOPY WITH PROPOFOL;  Surgeon: Kathi Der, MD;  Location: [facility] ENDOSCOPY;  Service: Gastroenterology;  Laterality: N/A;  . ESOPHAGOGASTRODUODENOSCOPY (EGD) WITH PROPOFOL N/A 04/01/2019   Procedure: ESOPHAGOGASTRODUODENOSCOPY (EGD) WITH  PROPOFOL;  Surgeon: Kathi Der, MD;  Location: [facility] ENDOSCOPY;  Service: Gastroenterology;  Laterality: N/A;  . ESOPHAGOGASTRODUODENOSCOPY (EGD) WITH PROPOFOL N/A 10/05/2019   Procedure: ESOPHAGOGASTRODUODENOSCOPY (EGD) WITH PROPOFOL;  Surgeon: Kathi Der, MD;  Location: [facility] ENDOSCOPY;  Service: Gastroenterology;  Laterality: N/A;  . POLYPECTOMY  11/29/2020   Procedure: POLYPECTOMY;  Surgeon: Charlott Rakes, MD;  Location: [facility] ENDOSCOPY;  Service: Gastroenterology;;  . Sunnie Nielsen TATTOO INJECTION  11/29/2020   Procedure: SUBMUCOSAL TATTOO INJECTION;  Surgeon: Charlott Rakes, MD;  Location: [facility] ENDOSCOPY;  Service: Gastroenterology;;  . TONSILLECTOMY       OB History   No obstetric history on file.     Family History  Problem Relation Age of Onset  . Hypertension Mother   . Diabetes Mother   . CAD Father        died of MI at age 59  . Hypertension Father   . Diabetes Sister   . Diabetes Sister   . Kidney disease Neg Hx     Social History   Tobacco Use  . Smoking status: Never Smoker  . Smokeless tobacco: Never Used  Vaping Use  . Vaping Use: Never used  Substance Use Topics  . Alcohol use: Yes    Alcohol/week: 21.0 standard drinks    Types: 21 Glasses of wine per week    Comment: sometimes   . Drug use: No    Home Medications Prior to Admission medications   Medication Sig Start Date End Date Taking? Authorizing Provider  aspirin 81 MG EC tablet Take 81 mg by mouth daily. 06/14/20 06/14/21  [provider]  carvedilol (COREG) 25 MG tablet Take 25 mg by mouth 2 (two) times daily. 11/13/20   [provider]  cloNIDine (CATAPRES) 0.3 MG tablet Take 0.3 mg by mouth 2 (two) times daily. 09/26/20   [provider]  diltiazem (CARDIZEM CD) 180 MG 24 hr capsule Take 1 capsule (180 mg total) by mouth daily. 05/11/20   Glade Lloyd, MD  docusate sodium (COLACE) 100 MG capsule Take 1 capsule (100 mg total) by mouth 2 (two) times daily. 12/01/20    Berton Mount I, MD  DULoxetine (CYMBALTA) 60 MG capsule Take 60 mg by mouth at bedtime.     [provider]  HYDROcodone-acetaminophen (NORCO/VICODIN) 5-325 MG tablet Take 1 tablet by mouth 3 (three) times daily as needed for severe pain. 11/05/20   [provider]  hydroxychloroquine (PLAQUENIL) 200 MG tablet Take 200 mg by mouth daily. 06/16/16   [provider]  isosorbide mononitrate (IMDUR) 30 MG 24 hr tablet Take 30 mg by mouth daily. 11/15/20   [provider]  lidocaine (XYLOCAINE) 2 % solution Use as directed 15 mLs in the mouth or throat every 6 (six) hours as needed (for severe stomach pain not relieved with Maalox). 11/22/20   Nira Conn, MD  Multiple Vitamin (MULTIVITAMIN WITH MINERALS) TABS tablet Take 1 tablet by mouth daily. 06/03/19   Shahmehdi, Gemma Payor, MD  MYRBETRIQ 25 MG TB24 tablet Take 1 tablet (25 mg total) by mouth daily. 12/01/20   Barnetta Chapel, MD  pantoprazole (PROTONIX) 40 MG tablet Take 1 tablet (40 mg total) by mouth daily. 12/02/20   Barnetta Chapel,  MD  polyethylene glycol (MIRALAX / GLYCOLAX) 17 g packet Take 17 g by mouth daily as needed for mild constipation. 12/11/20   Glade Lloyd, MD  predniSONE (DELTASONE) 5 MG tablet Take 5 mg by mouth daily. 05/02/20   [provider]  QUEtiapine (SEROQUEL) 100 MG tablet Take 100 mg by mouth daily. 05/02/20   [provider]  tamsulosin (FLOMAX) 0.4 MG CAPS capsule Take 1 capsule (0.4 mg total) by mouth daily after supper. 12/11/20   Glade Lloyd, MD    Allergies    Patient has no known allergies.  Review of Systems   Review of Systems  Constitutional: Negative for fever.  Gastrointestinal: Negative for abdominal pain, nausea and vomiting.  Genitourinary: Positive for frequency (resolved). Negative for dysuria, hematuria and vaginal bleeding.    Physical Exam Updated Vital Signs BP (!) 151/93 (BP Location: Right Arm)   Pulse 64   Temp 97.9 F  (36.6 C)   Resp 18   SpO2 100%   Physical Exam Vitals and nursing note reviewed.  Constitutional:      General: She is not in acute distress.    Appearance: She is well-developed and well-nourished.     Comments: Resting in the bed in NAD  HENT:     Head: Normocephalic and atraumatic.  Eyes:     Extraocular Movements: EOM normal.  Cardiovascular:     Rate and Rhythm: Normal rate and regular rhythm.     Pulses: Normal pulses.  Pulmonary:     Effort: Pulmonary effort is normal.     Breath sounds: Normal breath sounds.     Comments: Clear lung sounds Abdominal:     General: There is no distension.     Palpations: Abdomen is soft. There is no mass.     Tenderness: There is no abdominal tenderness. There is no guarding or rebound.     Comments: No ttp  Musculoskeletal:        General: Normal range of motion.     Cervical back: Normal range of motion.  Skin:    General: Skin is warm.     Findings: No rash.  Neurological:     Mental Status: She is alert and oriented to person, place, and time.  Psychiatric:        Mood and Affect: Mood and affect normal.     ED Results / Procedures / Treatments   Labs (all labs ordered are listed, but only abnormal results are displayed) Labs Reviewed  URINALYSIS, ROUTINE W REFLEX MICROSCOPIC  CBC  BASIC METABOLIC PANEL    EKG None  Radiology No results found.  Procedures Procedures   Medications Ordered in ED Medications - No data to display  ED Course  I have reviewed the triage vital signs and the nursing notes.  Pertinent labs & imaging results that were available during my care of the patient were reviewed by me and considered in my medical decision making (see chart for details).    MDM Rules/Calculators/A&P                          Patient presenting for evaluation of urinary frequency/evaluation after Foley catheter was removed.  On exam, patient peers nontoxic.  Her urinary frequency has mostly resolved.  She  does continue to have some mild discomfort at the urethra, this is likely from removing Foley catheter before it was deflated.  No bleeding, I do not believe she needs further imaging or  emergent management.  Will check urine to ensure no infection.  Will check bladder scan to ensure no retention.  Will recheck labs to look for kidney function as it was elevated most recently.  Urine without infection.  Bladder scan shows 132 cc prevoid.  Multiple attempts to obtain blood without success.  Discussed with patient.  She does not want further attempts at blood work, and schedule follow-up with her primary care doctor.  As his urination is good at this point, I am agreeable to discharge without labs.  I discussed importance of close follow-up with urology and primary care.  At this time, patient appears safe for discharge.  Return precautions given.  Patient states she understands and agrees to plan.  Final Clinical Impression(s) / ED Diagnoses Final diagnoses:  Urinary frequency  Status post insertion of Foley catheter    Rx / DC Orders ED Discharge Orders    None       Alveria Apley, PA-C 12/13/20 1417    Rolan Bucco, MD 12/13/20 1529

## 2020-12-13 NOTE — Discharge Instructions (Addendum)
Continue taking home medications as prescribed. Use ice as needed for discomfort from the Foley being removed. I recommend you get your kidney function rechecked either with your primary care doctor or with a urologist, as we were unable to check that today in the ER. Follow-up with the urologist at your scheduled appointment next week. Return to the emergency room if you develop fevers, vomiting, abdominal pain, inability urinate, blood in your urine, or any new, worsening, or concerning symptoms.

## 2020-12-13 NOTE — ED Notes (Signed)
Tried to get patient blood in rt. Forehand. I didn't have any success.

## 2020-12-13 NOTE — ED Notes (Signed)
Attempted blood draw on patient 4x with assistance from additional ED Staff. No success, PA Sophia aware.

## 2020-12-13 NOTE — ED Triage Notes (Signed)
Patient discharged from inpatient department on 2/15 with indwelling catheter to follow up with urology and PCP. Patient states two days ago she removed the cathether and has urinated frequently ever since. States she has not contacted her PCP nor a urologist. Patient states she is unsure if she was supposed to keep the catheter in place.

## 2020-12-15 ENCOUNTER — Other Ambulatory Visit: Payer: Self-pay

## 2020-12-15 ENCOUNTER — Emergency Department (HOSPITAL_COMMUNITY): Payer: Medicare Other

## 2020-12-15 ENCOUNTER — Inpatient Hospital Stay (HOSPITAL_COMMUNITY)
Admission: EM | Admit: 2020-12-15 | Discharge: 2020-12-19 | DRG: 064 | Disposition: A | Discharge status: Skilled Nursing Facility | Payer: Medicare Other | Attending: Internal Medicine | Admitting: Internal Medicine

## 2020-12-15 ENCOUNTER — Observation Stay (HOSPITAL_COMMUNITY): Payer: Medicare Other

## 2020-12-15 ENCOUNTER — Encounter (HOSPITAL_COMMUNITY): Payer: Self-pay | Admitting: Emergency Medicine

## 2020-12-15 DIAGNOSIS — I10 Essential (primary) hypertension: Secondary | ICD-10-CM

## 2020-12-15 DIAGNOSIS — R339 Retention of urine, unspecified: Secondary | ICD-10-CM | POA: Diagnosis present

## 2020-12-15 DIAGNOSIS — D631 Anemia in chronic kidney disease: Secondary | ICD-10-CM | POA: Diagnosis present

## 2020-12-15 DIAGNOSIS — K56699 Other intestinal obstruction unspecified as to partial versus complete obstruction: Secondary | ICD-10-CM | POA: Diagnosis not present

## 2020-12-15 DIAGNOSIS — I6330 Cerebral infarction due to thrombosis of unspecified cerebral artery: Secondary | ICD-10-CM

## 2020-12-15 DIAGNOSIS — I4891 Unspecified atrial fibrillation: Secondary | ICD-10-CM | POA: Diagnosis present

## 2020-12-15 DIAGNOSIS — Z87440 Personal history of urinary (tract) infections: Secondary | ICD-10-CM

## 2020-12-15 DIAGNOSIS — I1310 Hypertensive heart and chronic kidney disease without heart failure, with stage 1 through stage 4 chronic kidney disease, or unspecified chronic kidney disease: Secondary | ICD-10-CM | POA: Diagnosis present

## 2020-12-15 DIAGNOSIS — N179 Acute kidney failure, unspecified: Secondary | ICD-10-CM | POA: Diagnosis present

## 2020-12-15 DIAGNOSIS — E162 Hypoglycemia, unspecified: Secondary | ICD-10-CM | POA: Diagnosis present

## 2020-12-15 DIAGNOSIS — Z20822 Contact with and (suspected) exposure to covid-19: Secondary | ICD-10-CM | POA: Diagnosis present

## 2020-12-15 DIAGNOSIS — R410 Disorientation, unspecified: Secondary | ICD-10-CM

## 2020-12-15 DIAGNOSIS — I639 Cerebral infarction, unspecified: Principal | ICD-10-CM | POA: Diagnosis present

## 2020-12-15 DIAGNOSIS — N1832 Chronic kidney disease, stage 3b: Secondary | ICD-10-CM | POA: Diagnosis not present

## 2020-12-15 DIAGNOSIS — R4182 Altered mental status, unspecified: Secondary | ICD-10-CM | POA: Diagnosis not present

## 2020-12-15 DIAGNOSIS — Z79899 Other long term (current) drug therapy: Secondary | ICD-10-CM

## 2020-12-15 DIAGNOSIS — T50915A Adverse effect of multiple unspecified drugs, medicaments and biological substances, initial encounter: Secondary | ICD-10-CM | POA: Diagnosis present

## 2020-12-15 DIAGNOSIS — Z7982 Long term (current) use of aspirin: Secondary | ICD-10-CM

## 2020-12-15 DIAGNOSIS — Z7952 Long term (current) use of systemic steroids: Secondary | ICD-10-CM

## 2020-12-15 DIAGNOSIS — G9341 Metabolic encephalopathy: Secondary | ICD-10-CM | POA: Diagnosis present

## 2020-12-15 DIAGNOSIS — I6782 Cerebral ischemia: Secondary | ICD-10-CM | POA: Diagnosis present

## 2020-12-15 DIAGNOSIS — Z8673 Personal history of transient ischemic attack (TIA), and cerebral infarction without residual deficits: Secondary | ICD-10-CM

## 2020-12-15 DIAGNOSIS — R29703 NIHSS score 3: Secondary | ICD-10-CM | POA: Diagnosis not present

## 2020-12-15 DIAGNOSIS — G9340 Encephalopathy, unspecified: Secondary | ICD-10-CM | POA: Diagnosis present

## 2020-12-15 DIAGNOSIS — Z8249 Family history of ischemic heart disease and other diseases of the circulatory system: Secondary | ICD-10-CM

## 2020-12-15 DIAGNOSIS — G928 Other toxic encephalopathy: Secondary | ICD-10-CM | POA: Diagnosis present

## 2020-12-15 DIAGNOSIS — G459 Transient cerebral ischemic attack, unspecified: Secondary | ICD-10-CM | POA: Diagnosis not present

## 2020-12-15 DIAGNOSIS — E2740 Unspecified adrenocortical insufficiency: Secondary | ICD-10-CM | POA: Diagnosis present

## 2020-12-15 DIAGNOSIS — M069 Rheumatoid arthritis, unspecified: Secondary | ICD-10-CM | POA: Diagnosis present

## 2020-12-15 LAB — URINALYSIS, ROUTINE W REFLEX MICROSCOPIC
Bilirubin Urine: NEGATIVE
Glucose, UA: NEGATIVE mg/dL
Hgb urine dipstick: NEGATIVE
Ketones, ur: 5 mg/dL — AB
Leukocytes,Ua: NEGATIVE
Nitrite: NEGATIVE
Protein, ur: NEGATIVE mg/dL
Specific Gravity, Urine: 1.012 (ref 1.005–1.030)
pH: 5 (ref 5.0–8.0)

## 2020-12-15 LAB — RESP PANEL BY RT-PCR (FLU A&B, COVID) ARPGX2
Influenza A by PCR: NEGATIVE
Influenza B by PCR: NEGATIVE
SARS Coronavirus 2 by RT PCR: NEGATIVE

## 2020-12-15 LAB — COMPREHENSIVE METABOLIC PANEL
ALT: 14 U/L (ref 0–44)
AST: 17 U/L (ref 15–41)
Albumin: 3.5 g/dL (ref 3.5–5.0)
Alkaline Phosphatase: 63 U/L (ref 38–126)
Anion gap: 12 (ref 5–15)
BUN: 14 mg/dL (ref 8–23)
CO2: 19 mmol/L — ABNORMAL LOW (ref 22–32)
Calcium: 9.2 mg/dL (ref 8.9–10.3)
Chloride: 113 mmol/L — ABNORMAL HIGH (ref 98–111)
Creatinine, Ser: 1.58 mg/dL — ABNORMAL HIGH (ref 0.44–1.00)
GFR, Estimated: 35 mL/min — ABNORMAL LOW (ref >60)
Glucose, Bld: 75 mg/dL (ref 70–99)
Potassium: 3.8 mmol/L (ref 3.5–5.1)
Sodium: 144 mmol/L (ref 135–145)
Total Bilirubin: 0.6 mg/dL (ref 0.3–1.2)
Total Protein: 6.6 g/dL (ref 6.5–8.1)

## 2020-12-15 LAB — RAPID URINE DRUG SCREEN, HOSP PERFORMED
Amphetamines: NOT DETECTED
Barbiturates: NOT DETECTED
Benzodiazepines: NOT DETECTED
Cocaine: NOT DETECTED
Opiates: NOT DETECTED
Tetrahydrocannabinol: NOT DETECTED

## 2020-12-15 LAB — CBC
HCT: 35.6 % — ABNORMAL LOW (ref 36.0–46.0)
Hemoglobin: 11.2 g/dL — ABNORMAL LOW (ref 12.0–15.0)
MCH: 30.6 pg (ref 26.0–34.0)
MCHC: 31.5 g/dL (ref 30.0–36.0)
MCV: 97.3 fL (ref 80.0–100.0)
Platelets: 215 10*3/uL (ref 150–400)
RBC: 3.66 MIL/uL — ABNORMAL LOW (ref 3.87–5.11)
RDW: 14 % (ref 11.5–15.5)
WBC: 9.5 10*3/uL (ref 4.0–10.5)
nRBC: 0 % (ref 0.0–0.2)

## 2020-12-15 LAB — CBG MONITORING, ED
Glucose-Capillary: 54 mg/dL — ABNORMAL LOW (ref 70–99)
Glucose-Capillary: 56 mg/dL — ABNORMAL LOW (ref 70–99)
Glucose-Capillary: 94 mg/dL (ref 70–99)

## 2020-12-15 MED ORDER — DULOXETINE HCL 60 MG PO CPEP
60.0000 mg | ORAL_CAPSULE | Freq: Every day | ORAL | Status: DC
  Administered 2020-12-16 – 2020-12-18 (×4): 60 mg via ORAL
  Filled 2020-12-15 (×5): qty 1

## 2020-12-15 MED ORDER — ACETAMINOPHEN 325 MG PO TABS
650.0000 mg | ORAL_TABLET | ORAL | Status: DC | PRN
  Administered 2020-12-16: 650 mg via ORAL
  Filled 2020-12-15: qty 2

## 2020-12-15 MED ORDER — DEXTROSE IN LACTATED RINGERS 5 % IV SOLN: INTRAVENOUS | Status: DC

## 2020-12-15 MED ORDER — STROKE: EARLY STAGES OF RECOVERY BOOK
Freq: Once | Status: DC
  Filled 2020-12-15: qty 1

## 2020-12-15 MED ORDER — ASPIRIN EC 325 MG PO TBEC
325.0000 mg | DELAYED_RELEASE_TABLET | Freq: Every day | ORAL | Status: DC
  Administered 2020-12-16 – 2020-12-19 (×4): 325 mg via ORAL
  Filled 2020-12-15 (×5): qty 1

## 2020-12-15 MED ORDER — HYDROCODONE-ACETAMINOPHEN 5-325 MG PO TABS
1.0000 | ORAL_TABLET | Freq: Three times a day (TID) | ORAL | Status: DC | PRN
Start: 2020-12-15 — End: 2020-12-17
  Administered 2020-12-16 (×2): 1 via ORAL
  Filled 2020-12-15 (×2): qty 1

## 2020-12-15 MED ORDER — CARVEDILOL 25 MG PO TABS
25.0000 mg | ORAL_TABLET | Freq: Two times a day (BID) | ORAL | Status: DC
  Administered 2020-12-16 – 2020-12-19 (×8): 25 mg via ORAL
  Filled 2020-12-15 (×4): qty 1
  Filled 2020-12-15: qty 2
  Filled 2020-12-15 (×2): qty 1
  Filled 2020-12-15: qty 8

## 2020-12-15 MED ORDER — ENOXAPARIN SODIUM 40 MG/0.4ML ~~LOC~~ SOLN
40.0000 mg | SUBCUTANEOUS | Status: DC
  Administered 2020-12-16 – 2020-12-18 (×4): 40 mg via SUBCUTANEOUS
  Filled 2020-12-15 (×4): qty 0.4

## 2020-12-15 MED ORDER — ACETAMINOPHEN 650 MG RE SUPP: 650.0000 mg | RECTAL | Status: DC | PRN

## 2020-12-15 MED ORDER — ACETAMINOPHEN 160 MG/5ML PO SOLN: 650.0000 mg | ORAL | Status: DC | PRN

## 2020-12-15 MED ORDER — SODIUM CHLORIDE 0.9 % IV SOLN: INTRAVENOUS | Status: DC

## 2020-12-15 MED ORDER — PANTOPRAZOLE SODIUM 40 MG PO TBEC
40.0000 mg | DELAYED_RELEASE_TABLET | Freq: Every day | ORAL | Status: DC
  Administered 2020-12-16 – 2020-12-19 (×5): 40 mg via ORAL
  Filled 2020-12-15 (×5): qty 1

## 2020-12-15 MED ORDER — CLONIDINE HCL 0.1 MG PO TABS
0.1000 mg | ORAL_TABLET | Freq: Two times a day (BID) | ORAL | Status: DC
  Administered 2020-12-16 – 2020-12-19 (×8): 0.1 mg via ORAL
  Filled 2020-12-15 (×8): qty 1

## 2020-12-15 MED ORDER — LACTULOSE 10 GM/15ML PO SOLN
20.0000 g | Freq: Two times a day (BID) | ORAL | Status: DC | PRN
Start: 2020-12-15 — End: 2020-12-16
  Administered 2020-12-16: 20 g via ORAL
  Filled 2020-12-15 (×2): qty 30

## 2020-12-15 MED ORDER — QUETIAPINE FUMARATE 100 MG PO TABS
100.0000 mg | ORAL_TABLET | Freq: Every day | ORAL | Status: DC
  Administered 2020-12-16 (×2): 100 mg via ORAL
  Filled 2020-12-15 (×2): qty 2
  Filled 2020-12-15: qty 1

## 2020-12-15 MED ORDER — MIRABEGRON ER 25 MG PO TB24
25.0000 mg | ORAL_TABLET | Freq: Every day | ORAL | Status: DC
  Administered 2020-12-16 – 2020-12-19 (×4): 25 mg via ORAL
  Filled 2020-12-15 (×7): qty 1

## 2020-12-15 MED ORDER — ADULT MULTIVITAMIN W/MINERALS CH
1.0000 | ORAL_TABLET | Freq: Every day | ORAL | Status: DC
  Administered 2020-12-16 – 2020-12-19 (×5): 1 via ORAL
  Filled 2020-12-15 (×6): qty 1

## 2020-12-15 MED ORDER — DOCUSATE SODIUM 100 MG PO CAPS
100.0000 mg | ORAL_CAPSULE | Freq: Two times a day (BID) | ORAL | Status: DC
  Administered 2020-12-16 – 2020-12-19 (×8): 100 mg via ORAL
  Filled 2020-12-15 (×8): qty 1

## 2020-12-15 MED ORDER — TAMSULOSIN HCL 0.4 MG PO CAPS
0.4000 mg | ORAL_CAPSULE | Freq: Every day | ORAL | Status: DC
  Administered 2020-12-16 – 2020-12-18 (×4): 0.4 mg via ORAL
  Filled 2020-12-15 (×4): qty 1

## 2020-12-15 MED ORDER — ISOSORBIDE MONONITRATE ER 30 MG PO TB24
30.0000 mg | ORAL_TABLET | Freq: Every day | ORAL | Status: DC
Start: 2020-12-15 — End: 2020-12-19
  Administered 2020-12-16 – 2020-12-19 (×5): 30 mg via ORAL
  Filled 2020-12-15 (×5): qty 1

## 2020-12-15 MED ORDER — CLONIDINE HCL 0.1 MG PO TABS
0.1000 mg | ORAL_TABLET | Freq: Three times a day (TID) | ORAL | Status: DC | PRN
  Administered 2020-12-16 (×3): 0.1 mg via ORAL
  Filled 2020-12-15: qty 1

## 2020-12-15 MED ORDER — CLONIDINE HCL 0.2 MG PO TABS: 0.3000 mg | ORAL_TABLET | Freq: Two times a day (BID) | ORAL | Status: DC

## 2020-12-15 MED ORDER — DEXTROSE 50 % IV SOLN
12.5000 g | Freq: Once | INTRAVENOUS | Status: AC
  Administered 2020-12-15: 12.5 g via INTRAVENOUS
  Filled 2020-12-15: qty 50

## 2020-12-15 MED ORDER — HYDROXYCHLOROQUINE SULFATE 200 MG PO TABS
200.0000 mg | ORAL_TABLET | Freq: Every day | ORAL | Status: DC
Start: 2020-12-15 — End: 2020-12-19
  Administered 2020-12-16 – 2020-12-19 (×4): 200 mg via ORAL
  Filled 2020-12-15 (×7): qty 1

## 2020-12-15 MED ORDER — POLYETHYLENE GLYCOL 3350 17 G PO PACK: 17.0000 g | PACK | Freq: Every day | ORAL | Status: DC | PRN

## 2020-12-15 MED ORDER — ASPIRIN 81 MG PO TBEC: 81.0000 mg | DELAYED_RELEASE_TABLET | Freq: Every day | ORAL | Status: DC

## 2020-12-15 NOTE — ED Notes (Signed)
[facility] RN Josh notified

## 2020-12-15 NOTE — ED Notes (Signed)
Patient back from MRI.

## 2020-12-15 NOTE — ED Triage Notes (Signed)
Pt to triage via [facility] from home.  Pt with altered mental status.  Oriented to name only.  Unable to follow commands.  Denies pain.  Discharged from hospital on Tuesday for small bowel obstruction.  LSN yesterday at 1330.  Daughter found her altered today.  Arrived to triage with no pants on.

## 2020-12-15 NOTE — ED Provider Notes (Signed)
[facility] EMERGENCY DEPARTMENT Provider Note   CSN: [id number] Arrival date & time: 12/15/20  1143     History Chief Complaint  Patient presents with  . Altered Mental Status    Michelle Graves is a 70 y.o. female.  HPI    Level 5 caveat due to confusion/altered mental status.  Michelle Graves is a 70 y.o. female, with a history of anemia, HTN, stroke, colonic stricture, urinary retention, presenting to the ED with confusion that was definitively noted by the patient's daughter earlier today.  Patient lives by herself, but the daughter visits multiple days a week. She states patient was oriented and acting her normal self Thursday evening after she was discharged from the ED. She seemed sleepier than normal yesterday. However, when patient's daughter visited the patient this morning the patient did not recognize her. She was also not wearing any pants and seemed disoriented. Daughter states patient is typically completely oriented with only mild forgetfulness. Her previous strokes have only affected her and that she is unable to perform multistep processes, such as cooking or driving.  Patient endorses pain to the suprapubic region that goes into the right side of her back, but her description is rather vague.  She does deny fever, nausea/vomiting, diarrhea, hematochezia/melena, or any other complaints.  Past Medical History:  Diagnosis Date  . Acute on chronic kidney failure (HCC)   . Anemia   . Arthritis   . Chest pain   . Diverticulitis   . Hypertension   . Stroke (HCC)   . UTI (urinary tract infection) 10/2019    Patient Active Problem List   Diagnosis Date Noted  . 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 Bowel obstruction (HCC) 05/06/2020  . SIRS (systemic inflammatory response syndrome) (HCC) 05/06/2020  . UTI (urinary tract infection) 11/08/2019  . Major depressive disorder, recurrent episode, moderate (HCC) 11/08/2019  . Urinary retention  10/28/2019  . Acute on chronic kidney failure (HCC) 10/27/2019  . Malnutrition of moderate degree 10/03/2019  . Colitis 09/30/2019  . AKI (acute kidney injury) (HCC) 05/28/2019  . Nausea & vomiting 05/28/2019  . 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 Anemia, chronic disease 05/28/2019  . Swelling   . Small vessel disease, cerebrovascular   . Acute ischemic stroke (HCC)   . Weakness   . Acute left ankle pain   . Acute on chronic renal failure (HCC) 05/13/2019  . Weakness of both lower extremities 05/13/2019  . Cerebral thrombosis with cerebral infarction 03/31/2019  . New onset a-fib (HCC) 03/27/2019  . Acute kidney injury superimposed on CKD (HCC) 03/24/2019  . Hypotension 03/24/2019  . Hematemesis 03/24/2019  . Dark stools 03/24/2019  . Diverticulitis of intestine without perforation or abscess without bleeding   . Sinus tachycardia 08/27/2016  . Diverticulitis 08/26/2016  . HYPERTENSION, MALIGNANT ESSENTIAL 06/24/2007  . KIDNEY DISEASE, CHRONIC, STAGE III 06/24/2007  . Rheumatoid arthritis (HCC) 06/24/2007    Past Surgical History:  Procedure Laterality Date  . BIOPSY  04/01/2019   Procedure: BIOPSY;  Surgeon: Kathi Der, MD;  Location: [facility] ENDOSCOPY;  Service: Gastroenterology;;  . BIOPSY  10/05/2019   Procedure: BIOPSY;  Surgeon: Kathi Der, MD;  Location: [facility] ENDOSCOPY;  Service: Gastroenterology;;  . BIOPSY  05/10/2020   Procedure: BIOPSY;  Surgeon: Kathi Der, MD;  Location: [facility] ENDOSCOPY;  Service: Gastroenterology;;  . BIOPSY  11/29/2020   Procedure: BIOPSY;  Surgeon: Charlott Rakes, MD;  Location: [facility] ENDOSCOPY;  Service: Gastroenterology;;  . COLONOSCOPY N/A 11/29/2020  Procedure: COLONOSCOPY;  Surgeon: Charlott Rakes, MD;  Location: [facility] ENDOSCOPY;  Service: Gastroenterology;  Laterality: N/A;  . COLONOSCOPY WITH PROPOFOL N/A 04/01/2019   Procedure: COLONOSCOPY WITH PROPOFOL;  Surgeon: Kathi Der, MD;  Location: [facility] ENDOSCOPY;  Service:  Gastroenterology;  Laterality: N/A;  . COLONOSCOPY WITH PROPOFOL N/A 10/05/2019   Procedure: COLONOSCOPY WITH PROPOFOL;  Surgeon: Kathi Der, MD;  Location: [facility] ENDOSCOPY;  Service: Gastroenterology;  Laterality: N/A;  . COLONOSCOPY WITH PROPOFOL N/A 05/10/2020   Procedure: COLONOSCOPY WITH PROPOFOL;  Surgeon: Kathi Der, MD;  Location: [facility] ENDOSCOPY;  Service: Gastroenterology;  Laterality: N/A;  . ESOPHAGOGASTRODUODENOSCOPY (EGD) WITH PROPOFOL N/A 04/01/2019   Procedure: ESOPHAGOGASTRODUODENOSCOPY (EGD) WITH PROPOFOL;  Surgeon: Kathi Der, MD;  Location: [facility] ENDOSCOPY;  Service: Gastroenterology;  Laterality: N/A;  . ESOPHAGOGASTRODUODENOSCOPY (EGD) WITH PROPOFOL N/A 10/05/2019   Procedure: ESOPHAGOGASTRODUODENOSCOPY (EGD) WITH PROPOFOL;  Surgeon: Kathi Der, MD;  Location: [facility] ENDOSCOPY;  Service: Gastroenterology;  Laterality: N/A;  . POLYPECTOMY  11/29/2020   Procedure: POLYPECTOMY;  Surgeon: Charlott Rakes, MD;  Location: [facility] ENDOSCOPY;  Service: Gastroenterology;;  . Sunnie Nielsen TATTOO INJECTION  11/29/2020   Procedure: SUBMUCOSAL TATTOO INJECTION;  Surgeon: Charlott Rakes, MD;  Location: [facility] ENDOSCOPY;  Service: Gastroenterology;;  . TONSILLECTOMY       OB History   No obstetric history on file.     Family History  Problem Relation Age of Onset  . Hypertension Mother   . Diabetes Mother   . CAD Father        died of MI at age 53  . Hypertension Father   . Diabetes Sister   . Diabetes Sister   . Kidney disease Neg Hx     Social History   Tobacco Use  . Smoking status: Never Smoker  . Smokeless tobacco: Never Used  Vaping Use  . Vaping Use: Never used  Substance Use Topics  . Alcohol use: Yes    Alcohol/week: 21.0 standard drinks    Types: 21 Glasses of wine per week    Comment: sometimes   . Drug use: No    Home Medications Prior to Admission medications   Medication Sig Start Date End Date Taking? Authorizing Provider  aspirin 81 MG EC  tablet Take 81 mg by mouth daily. 06/14/20 06/14/21  [provider]  carvedilol (COREG) 25 MG tablet Take 25 mg by mouth 2 (two) times daily. 11/13/20   [provider]  cloNIDine (CATAPRES) 0.3 MG tablet Take 0.3 mg by mouth 2 (two) times daily. 09/26/20   [provider]  diltiazem (CARDIZEM CD) 180 MG 24 hr capsule Take 1 capsule (180 mg total) by mouth daily. 05/11/20   Glade Lloyd, MD  docusate sodium (COLACE) 100 MG capsule Take 1 capsule (100 mg total) by mouth 2 (two) times daily. 12/01/20   Berton Mount I, MD  DULoxetine (CYMBALTA) 60 MG capsule Take 60 mg by mouth at bedtime.     [provider]  HYDROcodone-acetaminophen (NORCO/VICODIN) 5-325 MG tablet Take 1 tablet by mouth 3 (three) times daily as needed for severe pain. 11/05/20   [provider]  hydroxychloroquine (PLAQUENIL) 200 MG tablet Take 200 mg by mouth daily. 06/16/16   [provider]  isosorbide mononitrate (IMDUR) 30 MG 24 hr tablet Take 30 mg by mouth daily. 11/15/20   [provider]  lidocaine (XYLOCAINE) 2 % solution Use as directed 15 mLs in the mouth or throat every 6 (six) hours as needed (for severe stomach pain not relieved with Maalox).  11/22/20   Nira Conn, MD  Multiple Vitamin (MULTIVITAMIN WITH MINERALS) TABS tablet Take 1 tablet by mouth daily. 06/03/19   Shahmehdi, Gemma Payor, MD  MYRBETRIQ 25 MG TB24 tablet Take 1 tablet (25 mg total) by mouth daily. 12/01/20   Barnetta Chapel, MD  pantoprazole (PROTONIX) 40 MG tablet Take 1 tablet (40 mg total) by mouth daily. 12/02/20   Berton Mount I, MD  polyethylene glycol (MIRALAX / GLYCOLAX) 17 g packet Take 17 g by mouth daily as needed for mild constipation. 12/11/20   Glade Lloyd, MD  predniSONE (DELTASONE) 5 MG tablet Take 5 mg by mouth daily. 05/02/20   [provider]  QUEtiapine (SEROQUEL) 100 MG tablet Take 100 mg by mouth daily. 05/02/20   [provider]  tamsulosin  (FLOMAX) 0.4 MG CAPS capsule Take 1 capsule (0.4 mg total) by mouth daily after supper. 12/11/20   Glade Lloyd, MD    Allergies    Patient has no known allergies.  Review of Systems   Review of Systems  Unable to perform ROS: Mental status change    Physical Exam Updated Vital Signs BP (!) 172/120 (BP Location: Left Arm)   Pulse 89   Temp 98.6 F (37 C)   Resp 20   SpO2 94%   Physical Exam Vitals and nursing note reviewed.  Constitutional:      General: She is not in acute distress.    Appearance: She is well-developed. She is not diaphoretic.  HENT:     Head: Normocephalic and atraumatic.     Mouth/Throat:     Mouth: Mucous membranes are moist.     Pharynx: Oropharynx is clear.  Eyes:     Conjunctiva/sclera: Conjunctivae normal.  Cardiovascular:     Rate and Rhythm: Normal rate and regular rhythm.     Pulses: Normal pulses.          Radial pulses are 2+ on the right side and 2+ on the left side.       Posterior tibial pulses are 2+ on the right side and 2+ on the left side.     Heart sounds: Normal heart sounds.     Comments: Tactile temperature in the extremities appropriate and equal bilaterally. Pulmonary:     Effort: Pulmonary effort is normal. No respiratory distress.     Breath sounds: Normal breath sounds.  Abdominal:     Palpations: Abdomen is soft.     Tenderness: There is no abdominal tenderness. There is no guarding.     Comments: Patient verbally indicates tenderness in the suprapubic region, but shows no other reaction.  Musculoskeletal:     Cervical back: Neck supple.     Right lower leg: No edema.     Left lower leg: No edema.  Lymphadenopathy:     Cervical: No cervical adenopathy.  Skin:    General: Skin is warm and dry.  Neurological:     Mental Status: She is alert.     Comments: Patient is unable to tell me the month, year, current president, or her current location. Sensation grossly intact to light touch in the extremities.   Grip  strengths equal bilaterally.   Strength 5/5 in all extremities.  She had some difficulty following directions with finger-to-nose testing.  I could also not get her to understand my request for her to touch her thumb to each one of her fingertips. Cranial nerves III-XII grossly intact.  Handles oral secretions without noted difficulty.  No noted  phonation or speech deficit. No facial droop.   Psychiatric:        Mood and Affect: Mood and affect normal.        Speech: Speech normal.        Behavior: Behavior normal.     ED Results / Procedures / Treatments   Labs (all labs ordered are listed, but only abnormal results are displayed) Labs Reviewed  COMPREHENSIVE METABOLIC PANEL - Abnormal; Notable for the following components:      Result Value   Chloride 113 (*)    CO2 19 (*)    Creatinine, Ser 1.58 (*)    GFR, Estimated 35 (*)    All other components within normal limits  CBC - Abnormal; Notable for the following components:   RBC 3.66 (*)    Hemoglobin 11.2 (*)    HCT 35.6 (*)    All other components within normal limits  URINALYSIS, ROUTINE W REFLEX MICROSCOPIC - Abnormal; Notable for the following components:   Ketones, ur 5 (*)    All other components within normal limits  CBG MONITORING, ED - Abnormal; Notable for the following components:   Glucose-Capillary 54 (*)    All other components within normal limits  CBG MONITORING, ED - Abnormal; Notable for the following components:   Glucose-Capillary 56 (*)    All other components within normal limits  URINE CULTURE    EKG EKG Interpretation  Date/Time:  Saturday December 15 2020 11:59:45 EST Ventricular Rate:  91 PR Interval:  154 QRS Duration: 94 QT Interval:  362 QTC Calculation: 445 R Axis:   -24 Text Interpretation: Normal sinus rhythm Cannot rule out Anterior infarct , age undetermined Abnormal ECG Confirmed by Benjiman Core [phone]) on 12/15/2020 2:21:33 PM   Radiology No results  found.  Procedures Ultrasound ED Peripheral IV (Provider)  Date/Time: 12/15/2020 2:45 PM Performed by: Anselm Pancoast, PA-C Authorized by: Anselm Pancoast, PA-C   Procedure details:    Indications: multiple failed IV attempts and poor IV access     Skin Prep: chlorhexidine gluconate     Location:  Right AC   Angiocath:  20 G   Bedside Ultrasound Guided: Yes     Images: archived     Patient tolerated procedure without complications: Yes     Dressing applied: Yes   Comments:     Positive flash.  Advanced under ultrasound guidance and flushed without pain, swelling, or other signs of infiltration.     Medications Ordered in ED Medications  dextrose 50 % solution 12.5 g (12.5 g Intravenous Given 12/15/20 1514)    ED Course  I have reviewed the triage vital signs and the nursing notes.  Pertinent labs & imaging results that were available during my care of the patient were reviewed by me and considered in my medical decision making (see chart for details).  Clinical Course as of 12/15/20 1558  Sat Dec 15, 2020  1404 Spoke with patient's daughter, [name]. She filled in the history of present illness contained in the HPI above. She states patient has been confused in this manner in the past when she had UTI. [SJ]    Clinical Course User Index [SJ] Fayelynn Distel C, PA-C   MDM Rules/Calculators/A&P                          Patient presents with confusion with last known normal evening of February 17. She does have confusion on exam. She  has recent history of bowel obstruction due to colonic stricture treated nonsurgically.  She was then seen again on February 12 and found to have urinary retention, requiring Foley catheter placement. Patient removed the Foley catheter while she was at home and again presented to the ED, this time complaining of urinary frequency, on February 17.    Findings and plan of care discussed with attending physician, Carmell Austria, MD.   End of shift  patient care handoff report given to Harlene Salts, PA-C. Plan: Patient awaiting head CT and abdominal/pelvic CT, with oral contrast and no IV contrast.    Vitals:   12/15/20 1430 12/15/20 1445 12/15/20 1500 12/15/20 1530  BP: (!) 181/105 (!) 172/102 (!) 137/106 (!) 149/105  Pulse: 67 82 66 73  Resp: Temp:      SpO2: 99% 100% 99% 100%     Final Clinical Impression(s) / ED Diagnoses Final diagnoses:  None    Rx / DC Orders ED Discharge Orders    None       Concepcion Living 12/15/20 1632    Benjiman Core, MD 12/15/20 2050

## 2020-12-15 NOTE — ED Provider Notes (Signed)
Care handoff received from [facility], [place] at shift change please see previous provider note for full details of visit.  In short 70 year old female presented with AMS recent UTI, increasing confusion and abdominal pain today.  Last known well 2 days ago.  Plan of care is to follow-up on CT abdomen pelvis and CT head. Physical Exam  BP (!) 169/104   Pulse 64   Temp 98.6 F (37 C)   Resp 12   SpO2 98%   Physical Exam Constitutional:      General: She is not in acute distress.    Appearance: Normal appearance. She is well-developed. She is not ill-appearing or diaphoretic.  HENT:     Head: Normocephalic and atraumatic.  Eyes:     General: Vision grossly intact. Gaze aligned appropriately.     Pupils: Pupils are equal, round, and reactive to light.  Neck:     Trachea: Trachea and phonation normal.  Pulmonary:     Effort: Pulmonary effort is normal. No respiratory distress.  Abdominal:     General: There is no distension.     Palpations: Abdomen is soft.     Tenderness: There is no abdominal tenderness. There is no guarding or rebound.  Musculoskeletal:        General: Normal range of motion.     Cervical back: Normal range of motion.  Skin:    General: Skin is warm and dry.  Neurological:     Mental Status: She is alert.     GCS: GCS eye subscore is 4. GCS verbal subscore is 5. GCS motor subscore is 6.     Comments: Speech is clear and goal oriented, follows commands Major Cranial nerves without deficit, no facial droop Moves extremities without ataxia, coordination intact Confused to place time and event  Psychiatric:        Behavior: Behavior normal.     ED Course/Procedures   Clinical Course as of 12/15/20 1918  Sat Dec 15, 2020  1404 Spoke with patient's daughter, [name]. She filled in the history of present illness contained in the HPI above. She states patient has been confused in this manner in the past when she had UTI. [SJ]  1840 Dr. Lajuana Ripple [BM]    Clinical  Course User Index [BM] Harlene Salts A, PA-C [SJ] Joy, Shawn C, PA-C    Procedures  MDM  CBC without leukocytosis to suggest infectious process, baseline anemia of 11.2. CMP shows no emergent lecture derangement, AKI, LFT elevations or gap UDS negative CBG initially low 54, improved with D50 to 94. Urinalysis without evidence of infection.  CT Head:  IMPRESSION:  Atrophy with small vessel chronic ischemic changes of deep cerebral  white matter.    No acute intracranial abnormalities.   CTAP:  IMPRESSION:  1. No acute abdominopelvic abnormality.  2. Again noted is significant narrowing at the hepatic flexure of  the colon, chronic across prior studies.  3. Large amount of stool in the transverse colon and right  hemicolon.  4. Rectosigmoid diverticulosis without acute inflammation.  5. Trace bilateral pleural effusions with chronic scarring at the  lung bases.    Aortic Atherosclerosis (ICD10-I70.0).   EKG:   Normal sinus rhythm Cannot rule out Anterior infarct , age undetermined Abnormal ECG Confirmed by Benjiman Core [phone]) on 12/15/2020 2:21:33 PM ---------------- Patient reassessed she is resting comfortably no acute distress no change in mental status.  Vital signs remained stable.  No source for infection at this time however patient remains  confused from baseline.  I discussed results with patient's daughter [name] as well.  Plan of care is admission to medicine service for further evaluation. - 6:40 PM: Consult with Dr. Lajuana Ripple, patient was accepted to medicine service.  I have added chest x-ray and MRI brain to patient's work-up.  Patient's daughter [name] advises patient does not have pacemaker.  Note: Portions of this report may have been transcribed using voice recognition software. Every effort was made to ensure accuracy; however, inadvertent computerized transcription errors may still be present.   Elizabeth Palau 12/15/20 [name], MD 12/15/20 2050

## 2020-12-15 NOTE — H&P (Addendum)
History and Physical    DOA: 12/15/2020  PCP: [facility]  Patient coming from: Home  Chief Complaint: Altered mental status  HPI: Michelle Graves is a 70 y.o. female with history h/o hypertension, CKD stage IIIb (baseline creatinine around 1.4-1.5), CVA in 2020, rheumatoid arthritis, recently admitted 1/31-12/01/20 for urinary retention related AKI and colonic stricture treated medically/required colonoscopy, again admitted 2/12-2/15 for recurrent AKI/urinary retention with creatinine up to 5.8 and discharged with Foley catheter to home with home health PT, now presents with confusion x2 days. Patient in fact was seen on 2/17 in ED as she had pulled out Foley catheter at home, bladder scan in the ED was unremarkable and patient was sent back home stating she did not need indwelling Foley anymore. According to daughter who is at bedside, patient was her normal self on Thursday night.  Yesterday morning (Friday), went home PT visited her she appeared to be confused, "not acting like herself" and her blood pressure was significantly elevated with systolic >200.  It was unclear if she actually took her morning medications as PT could not find her pill bottles.  Daughter visited her later and found the bottles in her bed but did not want to overdose her and rechecked her blood pressure which was apparently better around systolic 150s. When daughter went to check on her this morning, patient again appeared to be confused and disoriented.  She apparently was looking at her phone and did not have her pants on.  Daughter also found her depends in the kitchen and on the bathroom floor.  Patient apparently could not recognize the daughter.  Patient was brought to the ED in concern for ongoing confusion since yesterday morning. ED course: On arrival here patient disoriented, reporting pubic/diffuse abdominal and right flank discomfort.  BP elevated at systolic 170-180s.  Bladder  scan with 197 mL urine.  BMP showed BUN 14, creatinine 1.58, WBC 9.5, hemoglobin 11.2.  CT head unremarkable.  CT abdomen/pelvis shows colonic stricture at hepatic flexure similar to previous along with moderate to large stool along transverse colon and right hemicolon. On my interview, patient able to move all extremities.  Starts the conversation speaking fluently but tends to lose track (?  Expressive aphasia).  Oriented to person and place (was able to tell me this is a hospital but could not recollect the name).  Disoriented to time.  Points to right flank and lower abdomen when asked about pain.  Has not had a BM in 2 to 3 days.    Review of Systems: As per HPI otherwise 10 point review of systems negative.    Past Medical History:  Diagnosis Date  . Acute on chronic kidney failure (HCC)   . Anemia   . Arthritis   . Chest pain   . Diverticulitis   . Hypertension   . Stroke (HCC)   . UTI (urinary tract infection) 10/2019    Past Surgical History:  Procedure Laterality Date  . BIOPSY  04/01/2019   Procedure: BIOPSY;  Surgeon: Kathi Der, MD;  Location: [facility] ENDOSCOPY;  Service: Gastroenterology;;  . BIOPSY  10/05/2019   Procedure: BIOPSY;  Surgeon: Kathi Der, MD;  Location: [facility] ENDOSCOPY;  Service: Gastroenterology;;  . BIOPSY  05/10/2020   Procedure: BIOPSY;  Surgeon: Kathi Der, MD;  Location: [facility] ENDOSCOPY;  Service: Gastroenterology;;  . BIOPSY  11/29/2020   Procedure: BIOPSY;  Surgeon: Charlott Rakes, MD;  Location: [facility] ENDOSCOPY;  Service: Gastroenterology;;  . COLONOSCOPY  N/A 11/29/2020   Procedure: COLONOSCOPY;  Surgeon: Charlott Rakes, MD;  Location: [facility] ENDOSCOPY;  Service: Gastroenterology;  Laterality: N/A;  . COLONOSCOPY WITH PROPOFOL N/A 04/01/2019   Procedure: COLONOSCOPY WITH PROPOFOL;  Surgeon: Kathi Der, MD;  Location: [facility] ENDOSCOPY;  Service: Gastroenterology;  Laterality: N/A;  . COLONOSCOPY WITH PROPOFOL N/A 10/05/2019   Procedure:  COLONOSCOPY WITH PROPOFOL;  Surgeon: Kathi Der, MD;  Location: [facility] ENDOSCOPY;  Service: Gastroenterology;  Laterality: N/A;  . COLONOSCOPY WITH PROPOFOL N/A 05/10/2020   Procedure: COLONOSCOPY WITH PROPOFOL;  Surgeon: Kathi Der, MD;  Location: [facility] ENDOSCOPY;  Service: Gastroenterology;  Laterality: N/A;  . ESOPHAGOGASTRODUODENOSCOPY (EGD) WITH PROPOFOL N/A 04/01/2019   Procedure: ESOPHAGOGASTRODUODENOSCOPY (EGD) WITH PROPOFOL;  Surgeon: Kathi Der, MD;  Location: [facility] ENDOSCOPY;  Service: Gastroenterology;  Laterality: N/A;  . ESOPHAGOGASTRODUODENOSCOPY (EGD) WITH PROPOFOL N/A 10/05/2019   Procedure: ESOPHAGOGASTRODUODENOSCOPY (EGD) WITH PROPOFOL;  Surgeon: Kathi Der, MD;  Location: [facility] ENDOSCOPY;  Service: Gastroenterology;  Laterality: N/A;  . POLYPECTOMY  11/29/2020   Procedure: POLYPECTOMY;  Surgeon: Charlott Rakes, MD;  Location: [facility] ENDOSCOPY;  Service: Gastroenterology;;  . Sunnie Nielsen TATTOO INJECTION  11/29/2020   Procedure: SUBMUCOSAL TATTOO INJECTION;  Surgeon: Charlott Rakes, MD;  Location: [facility] ENDOSCOPY;  Service: Gastroenterology;;  . TONSILLECTOMY      Social history:  reports that she has never smoked. She has never used smokeless tobacco. She reports current alcohol use of about 21.0 standard drinks of alcohol per week. She reports that she does not use drugs.   No Known Allergies  Family History  Problem Relation Age of Onset  . Hypertension Mother   . Diabetes Mother   . CAD Father        died of MI at age 67  . Hypertension Father   . Diabetes Sister   . Diabetes Sister   . Kidney disease Neg Hx       Prior to Admission medications   Medication Sig Start Date End Date Taking? Authorizing Provider  aspirin 81 MG EC tablet Take 81 mg by mouth daily. 06/14/20 06/14/21 Yes [provider]  carvedilol (COREG) 25 MG tablet Take 25 mg by mouth 2 (two) times daily. 11/13/20  Yes [provider]  cloNIDine (CATAPRES) 0.3 MG tablet  Take 0.3 mg by mouth 2 (two) times daily. 09/26/20  Yes [provider]  diltiazem (CARDIZEM CD) 180 MG 24 hr capsule Take 1 capsule (180 mg total) by mouth daily. 05/11/20  Yes Glade Lloyd, MD  docusate sodium (COLACE) 100 MG capsule Take 1 capsule (100 mg total) by mouth 2 (two) times daily. 12/01/20  Yes Berton Mount I, MD  DULoxetine (CYMBALTA) 60 MG capsule Take 60 mg by mouth at bedtime.    Yes [provider]  HYDROcodone-acetaminophen (NORCO/VICODIN) 5-325 MG tablet Take 1 tablet by mouth 3 (three) times daily as needed for severe pain. 11/05/20  Yes [provider]  hydroxychloroquine (PLAQUENIL) 200 MG tablet Take 200 mg by mouth daily. 06/16/16  Yes [provider]  isosorbide mononitrate (IMDUR) 30 MG 24 hr tablet Take 30 mg by mouth daily. 11/15/20  Yes [provider]  Multiple Vitamin (MULTIVITAMIN WITH MINERALS) TABS tablet Take 1 tablet by mouth daily. 06/03/19  Yes Shahmehdi, Seyed A, MD  MYRBETRIQ 25 MG TB24 tablet Take 1 tablet (25 mg total) by mouth daily. 12/01/20  Yes Berton Mount I, MD  pantoprazole (PROTONIX) 40 MG tablet Take 1 tablet (40 mg total) by mouth daily. 12/02/20  Yes Barnetta Chapel,  MD  polyethylene glycol (MIRALAX / GLYCOLAX) 17 g packet Take 17 g by mouth daily as needed for mild constipation. 12/11/20  Yes Glade Lloyd, MD  predniSONE (DELTASONE) 5 MG tablet Take 5 mg by mouth daily. 05/02/20  Yes [provider]  QUEtiapine (SEROQUEL) 100 MG tablet Take 100 mg by mouth daily. 05/02/20  Yes [provider]  tamsulosin (FLOMAX) 0.4 MG CAPS capsule Take 1 capsule (0.4 mg total) by mouth daily after supper. 12/11/20  Yes Glade Lloyd, MD  lidocaine (XYLOCAINE) 2 % solution Use as directed 15 mLs in the mouth or throat every 6 (six) hours as needed (for severe stomach pain not relieved with Maalox). Patient not taking: No sig reported 11/22/20   Nira Conn, MD    Physical Exam: Vitals:    12/15/20 1715 12/15/20 1745 12/15/20 1800 12/15/20 1830  BP: (!) 179/106 (!) 178/109 (!) 173/117 (!) 181/110  Pulse: 76 78 77 74  Resp: Temp:      SpO2: 99% 100% 100% 100%    Constitutional: NAD, calm, comfortable Eyes: PERRL, lids and conjunctivae normal ENMT: Mucous membranes are moist. Posterior pharynx clear of any exudate or lesions.Normal dentition.  Neck: normal, supple, no masses, no thyromegaly Respiratory: clear to auscultation bilaterally, no wheezing, no crackles. Normal respiratory effort. No accessory muscle use.  Cardiovascular: Regular rate and rhythm, no murmurs / rubs / gallops. No extremity edema. 2+ pedal pulses. No carotid bruits.  Abdomen: Mild diffuse tenderness, especially along suprapubic area, nondistended, no masses palpated. Bowel sounds positive.  Musculoskeletal: no clubbing / cyanosis. No joint deformity upper and lower extremities. Good ROM, no contractures. Normal muscle tone.  Neurologic: CN 2-12 grossly intact. Sensation intact, DTR normal. Strength 5/5 in all 4.  Psychiatric: Cheerful mood but appears to have impaired judgment and insight. Alert and oriented x 2.  SKIN/catheters: no rashes, lesions, ulcers. No induration  Labs on Admission: I have personally reviewed following labs and imaging studies  CBC: Recent Labs  Lab 12/09/20 0451 12/10/20 0747 12/11/20 0755 12/15/20 1150  WBC 8.1 8.3 8.3 9.5  NEUTROABS  --  5.0 4.3  --   HGB 10.1* 9.5* 9.2* 11.2*  HCT 30.9* 30.0* 30.1* 35.6*  MCV 94.5 96.2 97.7 97.3  PLT 166 163 181 215   Basic Metabolic Panel: Recent Labs  Lab 12/09/20 0451 12/10/20 0747 12/11/20 0755 12/15/20 1150  NA 139 138 139 144  K 4.6 4.6 4.7 3.8  CL 108 110 113* 113*  CO2 21* 19* 18* 19*  GLUCOSE 155* 116* 117* 75  BUN 69* 47* 33* 14  CREATININE 4.08* 2.41* 1.81* 1.58*  CALCIUM 8.9 8.6* 8.4* 9.2  MG  --   --  1.4*  --    GFR: Estimated Creatinine Clearance: 36.8 mL/min (A) (by C-G formula based  on SCr of 1.58 mg/dL (H)). Recent Labs  Lab 12/09/20 0451 12/10/20 0747 12/11/20 0755 12/15/20 1150  WBC 8.1 8.3 8.3 9.5   Liver Function Tests: Recent Labs  Lab 12/09/20 0451 12/15/20 1150  AST 12* 17  ALT 13 14  ALKPHOS 62 63  BILITOT 0.7 0.6  PROT 5.7* 6.6  ALBUMIN 2.9* 3.5   No results for input(s): LIPASE, AMYLASE in the last 168 hours. No results for input(s): AMMONIA in the last 168 hours. Coagulation Profile: No results for input(s): INR, PROTIME in the last 168 hours. Cardiac Enzymes: No results for input(s): CKTOTAL, CKMB, CKMBINDEX, TROPONINI in the last 168 hours. BNP (  last 3 results) No results for input(s): PROBNP in the last 8760 hours. HbA1C: No results for input(s): HGBA1C in the last 72 hours. CBG: Recent Labs  Lab 12/11/20 0637 12/11/20 1133 12/15/20 1247 12/15/20 1513 12/15/20 1643  GLUCAP 110* 114* 54* 56* 94   Lipid Profile: No results for input(s): CHOL, HDL, LDLCALC, TRIG, CHOLHDL, LDLDIRECT in the last 72 hours. Thyroid Function Tests: No results for input(s): TSH, T4TOTAL, FREET4, T3FREE, THYROIDAB in the last 72 hours. Anemia Panel: No results for input(s): VITAMINB12, FOLATE, FERRITIN, TIBC, IRON, RETICCTPCT in the last 72 hours. Urine analysis:    Component Value Date/Time   COLORURINE YELLOW 12/15/2020 1524   APPEARANCEUR CLEAR 12/15/2020 1524   LABSPEC 1.012 12/15/2020 1524   PHURINE 5.0 12/15/2020 1524   GLUCOSEU NEGATIVE 12/15/2020 1524   HGBUR NEGATIVE 12/15/2020 1524   BILIRUBINUR NEGATIVE 12/15/2020 1524   KETONESUR 5 (A) 12/15/2020 1524   PROTEINUR NEGATIVE 12/15/2020 1524   UROBILINOGEN 1.0 06/24/2010 1850   NITRITE NEGATIVE 12/15/2020 1524   LEUKOCYTESUR NEGATIVE 12/15/2020 1524    Radiological Exams on Admission: Personally reviewed  CT ABDOMEN PELVIS WO CONTRAST  Result Date: 12/15/2020 CLINICAL DATA:  Bowel obstruction suspected.  Abdominal pain. EXAM: CT ABDOMEN AND PELVIS WITHOUT CONTRAST TECHNIQUE:  Multidetector CT imaging of the abdomen and pelvis was performed following the standard protocol without IV contrast. COMPARISON:  CT dated November 26, 2020. FINDINGS: Lower chest: There is chronic scarring at the lung bases. There are trace bilateral pleural effusions.The heart is enlarged. Hepatobiliary: The liver is normal. Normal gallbladder.There is no biliary ductal dilation. Pancreas: Normal contours without ductal dilatation. No peripancreatic fluid collection. Spleen: Unremarkable. Adrenals/Urinary Tract: --Adrenal glands: Unremarkable. --Right kidney/ureter: No hydronephrosis or radiopaque kidney stones. --Left kidney/ureter: No hydronephrosis or radiopaque kidney stones. --Urinary bladder: Unremarkable. Stomach/Bowel: --Stomach/Duodenum: No hiatal hernia or other gastric abnormality. Normal duodenal course and caliber. --Small bowel: Unremarkable. --Colon: Rectosigmoid diverticulosis without acute inflammation. Again noted is significant narrowing at the hepatic flexure, chronic across prior studies. There is a large amount of stool in the transverse colon and right hemicolon. --Appendix: Normal. Vascular/Lymphatic: Atherosclerotic calcification is present within the non-aneurysmal abdominal aorta, without hemodynamically significant stenosis. --No retroperitoneal lymphadenopathy. --No mesenteric lymphadenopathy. --No pelvic or inguinal lymphadenopathy. Reproductive: Unremarkable Other: No ascites or free air. The abdominal wall is normal. Musculoskeletal. A chronic appearing compression fracture is noted of the L5 vertebral body. IMPRESSION: 1. No acute abdominopelvic abnormality. 2. Again noted is significant narrowing at the hepatic flexure of the colon, chronic across prior studies. 3. Large amount of stool in the transverse colon and right hemicolon. 4. Rectosigmoid diverticulosis without acute inflammation. 5. Trace bilateral pleural effusions with chronic scarring at the lung bases. Aortic  Atherosclerosis (ICD10-I70.0). Electronically Signed   By: Katherine Mantle M.D.   On: 12/15/2020 16:31   CT Head Wo Contrast  Result Date: 12/15/2020 CLINICAL DATA:  Mental status changes, altered mental status, abdominal pain, recent small-bowel obstruction just released from hospital 4 days ago EXAM: CT HEAD WITHOUT CONTRAST TECHNIQUE: Contiguous axial images were obtained from the base of the skull through the vertex without intravenous contrast. COMPARISON:  06/16/2019 FINDINGS: Brain: Generalized atrophy. Cavum septum pellucidum. Otherwise normal ventricular morphology. No midline shift or mass effect. Small vessel chronic ischemic changes of deep cerebral white matter. No intracranial hemorrhage, mass lesion, evidence of acute infarction, or extra-axial fluid collection. Vascular: Atherosclerotic calcifications of internal carotid arteries at skull base Skull: Intact Sinuses/Orbits: Clear Other: N/A IMPRESSION: Atrophy with small  vessel chronic ischemic changes of deep cerebral white matter. No acute intracranial abnormalities. Electronically Signed   By: Ulyses Southward M.D.   On: 12/15/2020 16:32    EKG: Independently reviewed.  Normal sinus rhythm, QTC WNL     Assessment and Plan:   Active Problems:   TIA (transient ischemic attack)    1.  Altered mental status: Metabolic/hypertensive encephalopathy versus TIA/stroke.  Patient thinks she is taking aspirin and Plavix but according to daughter she has been off Plavix for a while.  Ordered MRI,  aspirin ( 325 mg), neurochecks and PT/OT/bedside swallow screen.  Echocardiogram.  Patient was also noted to be somewhat hypoglycemic with blood glucose 54 while in the ED.  According to daughter, she has been eating about 50% of her meals.  Will place her on dextrose fluids and clear liquid diet while monitoring blood glucose levels.  Control blood pressure to a reasonable level until stroke ruled out.  Resume home medications at lower dose.    2.   Abdominal pain: Secondary to stool retention/stricture likely.  UA not indicative of infection.  Follow-up cultures.  No evidence of pyelonephritis on CT.Will do serial bladder scans to rule out retention.  Cautious pain medication use to avoid further retention.  Clear liquid diet, IVF and lactulose for now.  Consult GI in a.m. for further recommendations as daughter states surgical options were being considered on outpatient follow-up visits.    3.  Hypertension: Uncontrolled in the setting of confusion, questionable compliance with home medications.  Likely contributing to problem #1.  Resume Coreg, Imdur.  Will resume clonidine 0.1 mg (takes 0.3 mg at home) until stroke ruled out and additional dose as needed ordered with parameters.  4.  CKD stage IIIb: Creatinine appears to be back to baseline now.  Monitor for urinary retention with bladder scans as needed.  Stool retention due to strictures likely contributing to urinary retention as well.  5.  Urinary retention: Likely secondary to constipation.  Recent admission for the same causing AKI.  Bladder scan today appeared reassuring.  Continue to monitor as needed.  On external Foley currently.  Creatinine at baseline.  Resumed Flomax   DVT prophylaxis: Lovenox  COVID screen: Pending  Code Status: Full code as confirmed with daughter at bedside.Health care proxy would be daughter, Alinda Money  Patient/Family Communication: Discussed with patient and all questions answered to satisfaction.  Consults called: None.  Consider GI eval in a.m. Admission status :Patient will be admitted under OBSERVATION status.The patient's presenting symptoms, physical exam findings, and initial radiographic and laboratory data in the context of their medical condition is felt to place them at low risk for further clinical deterioration. Furthermore, it is anticipated that the patient will be medically stable for discharge from the hospital within 2 midnights of hospital  stay.  May need upgrade to inpatient status if has persistent confusion/GI interventions warranted or stroke ruled in.     Alessandra Bevels MD [facility] Pager in [place]  If 7PM-7AM, please contact night-coverage [email or web address]   12/15/2020, 7:22 PM

## 2020-12-15 NOTE — ED Notes (Signed)
Pt transported to MRI 

## 2020-12-16 ENCOUNTER — Inpatient Hospital Stay (HOSPITAL_BASED_OUTPATIENT_CLINIC_OR_DEPARTMENT_OTHER): Payer: Medicare Other

## 2020-12-16 DIAGNOSIS — G459 Transient cerebral ischemic attack, unspecified: Secondary | ICD-10-CM

## 2020-12-16 LAB — ECHOCARDIOGRAM COMPLETE
Area-P 1/2: 2.95 cm2
Height: 67 in
P 1/2 time: 599 msec
S' Lateral: 2.8 cm
Weight: 2768 oz

## 2020-12-16 LAB — CBG MONITORING, ED
Glucose-Capillary: 71 mg/dL (ref 70–99)
Glucose-Capillary: 115 mg/dL — ABNORMAL HIGH (ref 70–99)
Glucose-Capillary: 103 mg/dL — ABNORMAL HIGH (ref 70–99)
Glucose-Capillary: 224 mg/dL — ABNORMAL HIGH (ref 70–99)

## 2020-12-16 LAB — CBC
HCT: 32.6 % — ABNORMAL LOW (ref 36.0–46.0)
Hemoglobin: 10.9 g/dL — ABNORMAL LOW (ref 12.0–15.0)
MCH: 31.2 pg (ref 26.0–34.0)
MCHC: 33.4 g/dL (ref 30.0–36.0)
MCV: 93.4 fL (ref 80.0–100.0)
Platelets: 209 10*3/uL (ref 150–400)
RBC: 3.49 MIL/uL — ABNORMAL LOW (ref 3.87–5.11)
RDW: 14.1 % (ref 11.5–15.5)
WBC: 8.3 10*3/uL (ref 4.0–10.5)
nRBC: 0 % (ref 0.0–0.2)

## 2020-12-16 LAB — GLUCOSE, CAPILLARY: Glucose-Capillary: 96 mg/dL (ref 70–99)

## 2020-12-16 LAB — LIPID PANEL
Cholesterol: 179 mg/dL (ref 0–200)
HDL: 75 mg/dL (ref >40)
LDL Cholesterol: 93 mg/dL (ref 0–99)
Total CHOL/HDL Ratio: 2.4 RATIO
Triglycerides: 55 mg/dL (ref <150)
VLDL: 11 mg/dL (ref 0–40)

## 2020-12-16 LAB — CREATININE, SERUM
Creatinine, Ser: 1.51 mg/dL — ABNORMAL HIGH (ref 0.44–1.00)
GFR, Estimated: 37 mL/min — ABNORMAL LOW (ref >60)

## 2020-12-16 LAB — HEMOGLOBIN A1C
Hgb A1c MFr Bld: 6.3 % — ABNORMAL HIGH (ref 4.8–5.6)
Mean Plasma Glucose: 134.11 mg/dL

## 2020-12-16 MED ORDER — QUETIAPINE FUMARATE 50 MG PO TABS
50.0000 mg | ORAL_TABLET | Freq: Every day | ORAL | Status: DC
  Administered 2020-12-17 – 2020-12-19 (×3): 50 mg via ORAL
  Filled 2020-12-16 (×3): qty 1

## 2020-12-16 MED ORDER — HYDRALAZINE HCL 20 MG/ML IJ SOLN: 10.0000 mg | Freq: Four times a day (QID) | INTRAMUSCULAR | Status: DC | PRN

## 2020-12-16 MED ORDER — ONDANSETRON HCL 4 MG/2ML IJ SOLN
4.0000 mg | Freq: Once | INTRAMUSCULAR | Status: AC
  Administered 2020-12-16: 4 mg via INTRAVENOUS
  Filled 2020-12-16: qty 2

## 2020-12-16 MED ORDER — LACTULOSE 10 GM/15ML PO SOLN
20.0000 g | Freq: Two times a day (BID) | ORAL | Status: DC
  Administered 2020-12-16 – 2020-12-19 (×7): 20 g via ORAL
  Filled 2020-12-16 (×7): qty 30

## 2020-12-16 MED ORDER — ALUM & MAG HYDROXIDE-SIMETH 200-200-20 MG/5ML PO SUSP
30.0000 mL | Freq: Once | ORAL | Status: AC
  Administered 2020-12-16: 30 mL via ORAL
  Filled 2020-12-16: qty 30

## 2020-12-16 MED ORDER — HYDRALAZINE HCL 20 MG/ML IJ SOLN
10.0000 mg | Freq: Once | INTRAMUSCULAR | Status: AC
  Administered 2020-12-16: 10 mg via INTRAVENOUS
  Filled 2020-12-16: qty 1

## 2020-12-16 NOTE — Progress Notes (Signed)
PROGRESS NOTE    Michelle Graves  ZOX:[id number] DOB: 11-20-1950 DOA: 12/15/2020 PCP: [facility]    Brief Narrative:  70 year old female with history of hypertension, chronic kidney disease stage IIIb with baseline creatinine about 1.4, history of TIA and stroke 2020, rheumatoid arthritis on Plaquenil and recent multiple hospitalization presented back to hospital 1 more time with altered mental status.  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 1/31-2/5, admitted for urinary retention, acute kidney injury and colonic stricture, colonoscopy and medical management. 2/12-2/15, recurrent acute kidney injury and urinary retention creatinine as high as 5.8 and discharged home with Foley catheter with home health PT. 2/17, Foley catheter came out at home, presented to ER and found to have normalized renal function and urinating well so discharged. 2/19, brought back by her daughter stating that patient is more confused than usual.  Patient herself is poor historian and not sure what is going on with her. In the emergency room, blood pressures 200, CT head normal.  Creatinine 1.58 which is about baseline.  No urinary retention.  CT abdomen pelvis with colonic stricture that is known with large amount of stool along the transverse colon in the right hemicolon.  No evidence of active infection.  Subsequent MRI showed very small subacute left cerebellar stroke. Admitted due to altered mental status.   Assessment & Plan:   Active Problems:   TIA (transient ischemic attack)  Altered mental status/acute metabolic encephalopathy: Suspect multifactorial with recent multiple medical illnesses and hospitalizations. MRI brain with subacute cerebellar stroke, too small to cause any somatic symptoms. Blood sugars 54 on arrival, patient is not diabetic.  Suspect patient may suffer from episodic hypoglycemia. Polypharmacy  suspected, delirium due to acute illness is suspected. Patient is stabilizing.  We will continue to work on causes. Patient has no evidence of active infection. Urinary retention has improved.  Hypoglycemia: Presented with blood sugars 54.  She is not diabetic.  A1c 6.3.  Not on any hypoglycemic agents.  She is on chronic hydroxychloroquine and that can cause hypoglycemia. Check morning cortisol to rule out adrenocortical insufficiency. We will check frequent blood sugars to see if she gets nocturnal hypoglycemia. Adequate nutrition, high carb diet. Started on dextrose overnight, will continue until blood sugars are consistently more than 150.  Acute/subacute cerebellar stroke: Clinical findings, metabolic encephalopathy, however likely not contributed by recent stroke. CT head findings, normal. MRI of the brain, very small area of left lateral cerebellum infarction. Carotid Doppler, pending. 2D echocardiogram, pending. Antiplatelet therapy, aspirin. LDL 90.  Goal is less than 70.  We will start patient on statin.  Hemoglobin A1c 6.3.  No indication for treatment. DVT prophylaxis, Lovenox. Therapy recommendations, skilled nursing facility. We will start aggressive blood pressure treatment. Discussed with neurology, recommended sending for outpatient follow-up.  Accelerated hypertension: Blood pressures elevated on presentation.  She is on multiple blood pressure medications.  No indication for permissive hypertension. Resume all home medications, will continue to uptitrate medications.  Currently controlled.  Abdominal pain/chronic constipation/benign colonic stricture: Currently without evidence of bowel obstruction.  Start patient on aggressive laxative.  Chronic kidney disease stage IIIb: Improved to baseline.   DVT prophylaxis: enoxaparin (LOVENOX) injection 40 mg Start: 12/15/20 2200   Code Status: Full code Family Communication: Patient's daughter on the  phone. Disposition Plan: Status is: Observation  The patient will require care spanning > 2 midnights and should be moved to inpatient because: Altered mental  status and Inpatient level of care appropriate due to severity of illness  Dispo: The patient is from: Home              Anticipated d/c is to: SNF              Anticipated d/c date is: 2 days              Patient currently is not medically stable to d/c.   Difficult to place patient No         Consultants:   Neurology, curbside consult  Procedures:   None  Antimicrobials:   None   Subjective: Patient seen and examined.  She was still in the emergency room.  Discussed with nursing staff.  Patient is poor historian and she denies any complaints to me.  Patient was alert awake and pleasant.  She was oriented x2.  She did not exactly know which hospital she is in.  Denies any abdominal pain nausea or vomiting. She tells me that she lives by herself, her daughter helps her as she lives nearby.  Objective: Vitals:   12/16/20 1300 12/16/20 1315 12/16/20 1330 12/16/20 1345  BP: (!) 148/84 131/90 (!) 132/97 (!) 144/91  Pulse: 73 78 78 78  Resp: Temp:      TempSrc:      SpO2: 100% 100% 100% 100%  Weight:      Height:        Intake/Output Summary (Last 24 hours) at 12/16/2020 1356 Last data filed at 12/16/2020 0040 Gross per 24 hour  Intake -  Output 400 ml  Net -400 ml   Filed Weights   12/15/20 2137  Weight: 78.5 kg    Examination:  General exam: Appears calm and comfortable  Chronically sick looking but not in any distress. Patient is alert and oriented x2.  She has some intermittent confusion. Respiratory system: Clear to auscultation. Respiratory effort normal.  No added sounds. Cardiovascular system: S1 & S2 heard, RRR. No JVD, murmurs, rubs, gallops or clicks. No pedal edema. Gastrointestinal system: Abdomen is nondistended, soft and nontender. No organomegaly or masses felt. Normal  bowel sounds heard. Central nervous system: Alert and oriented x2.  No focal neurological deficits.  Speech is fluent. Extremities: Symmetric 5 x 5 power. Skin: No rashes, lesions or ulcers Psychiatry: Judgement and insight appear compromised.    Data Reviewed: I have personally reviewed following labs and imaging studies  CBC: Recent Labs  Lab 12/10/20 0747 12/11/20 0755 12/15/20 1150 12/15/20 2334  WBC 8.3 8.3 9.5 8.3  NEUTROABS 5.0 4.3  --   --   HGB 9.5* 9.2* 11.2* 10.9*  HCT 30.0* 30.1* 35.6* 32.6*  MCV 96.2 97.7 97.3 93.4  PLT 163 181 215 209   Basic Metabolic Panel: Recent Labs  Lab 12/10/20 0747 12/11/20 0755 12/15/20 1150 12/15/20 2334  NA 138 139 144  --   K 4.6 4.7 3.8  --   CL 110 113* 113*  --   CO2 19* 18* 19*  --   GLUCOSE 116* 117* 75  --   BUN 47* 33* 14  --   CREATININE 2.41* 1.81* 1.58* 1.51*  CALCIUM 8.6* 8.4* 9.2  --   MG  --  1.4*  --   --    GFR: Estimated Creatinine Clearance: 38.5 mL/min (A) (by C-G formula based on SCr of 1.51 mg/dL (H)). Liver Function Tests: Recent Labs  Lab 12/15/20 1150  AST 17  ALT  14  ALKPHOS 63  BILITOT 0.6  PROT 6.6  ALBUMIN 3.5   No results for input(s): LIPASE, AMYLASE in the last 168 hours. No results for input(s): AMMONIA in the last 168 hours. Coagulation Profile: No results for input(s): INR, PROTIME in the last 168 hours. Cardiac Enzymes: No results for input(s): CKTOTAL, CKMB, CKMBINDEX, TROPONINI in the last 168 hours. BNP (last 3 results) No results for input(s): PROBNP in the last 8760 hours. HbA1C: Recent Labs    12/16/20 0223  HGBA1C 6.3*   CBG: Recent Labs  Lab 12/15/20 1247 12/15/20 1513 12/15/20 1643 12/16/20 0103 12/16/20 0350  GLUCAP 54* 56* 94 71 115*   Lipid Profile: Recent Labs    12/16/20 0223  CHOL 179  HDL 75  LDLCALC 93  TRIG 55  CHOLHDL 2.4   Thyroid Function Tests: No results for input(s): TSH, T4TOTAL, FREET4, T3FREE, THYROIDAB in the last 72  hours. Anemia Panel: No results for input(s): VITAMINB12, FOLATE, FERRITIN, TIBC, IRON, RETICCTPCT in the last 72 hours. Sepsis Labs: No results for input(s): PROCALCITON, LATICACIDVEN in the last 168 hours.  Recent Results (from the past 240 hour(s))  SARS CORONAVIRUS 2 (TAT 6-24 HRS) Nasopharyngeal Nasopharyngeal Swab     Status: None   Collection Time: 12/08/20  4:03 PM   Specimen: Nasopharyngeal Swab  Result Value Ref Range Status   SARS Coronavirus 2 NEGATIVE NEGATIVE Final    Comment: (NOTE) SARS-CoV-2 target nucleic acids are NOT DETECTED.  The SARS-CoV-2 RNA is generally detectable in upper and lower respiratory specimens during the acute phase of infection. Negative results do not preclude SARS-CoV-2 infection, do not rule out co-infections with other pathogens, and should not be used as the sole basis for treatment or other patient management decisions. Negative results must be combined with clinical observations, patient history, and epidemiological information. The expected result is Negative.  Fact Sheet for Patients: HairSlick.no  Fact Sheet for Healthcare Providers: quierodirigir.com  This test is not yet approved or cleared by the [place] FDA and  has been authorized for detection and/or diagnosis of SARS-CoV-2 by FDA under an Emergency Use Authorization (EUA). This EUA will remain  in effect (meaning this test can be used) for the duration of the COVID-19 declaration under Se ction 564(b)(1) of the Act, 21 U.S.C. section 360bbb-3(b)(1), unless the authorization is terminated or revoked sooner.  Performed at [facility], [address] [place], [place] [address]   Urine Culture     Status: None   Collection Time: 12/08/20  6:37 PM   Specimen: Urine, Catheterized  Result Value Ref Range Status   Specimen Description URINE, CATHETERIZED  Final   Special Requests NONE  Final   Culture    Final    NO GROWTH Performed at [facility], [address] [place], [place] [address]    Report Status 12/09/2020 FINAL  Final  Resp Panel by RT-PCR (Flu A&B, Covid) Nasopharyngeal Swab     Status: None   Collection Time: 12/15/20  7:16 PM   Specimen: Nasopharyngeal Swab; Nasopharyngeal(NP) swabs in vial transport medium  Result Value Ref Range Status   SARS Coronavirus 2 by RT PCR NEGATIVE NEGATIVE Final    Comment: (NOTE) SARS-CoV-2 target nucleic acids are NOT DETECTED.  The SARS-CoV-2 RNA is generally detectable in upper respiratory specimens during the acute phase of infection. The lowest concentration of SARS-CoV-2 viral copies this assay can detect is 138 copies/mL. A negative result does not preclude SARS-Cov-2 infection  and should not be used as the sole basis for treatment or other patient management decisions. A negative result may occur with  improper specimen collection/handling, submission of specimen other than nasopharyngeal swab, presence of viral mutation(s) within the areas targeted by this assay, and inadequate number of viral copies(<138 copies/mL). A negative result must be combined with clinical observations, patient history, and epidemiological information. The expected result is Negative.  Fact Sheet for Patients:  BloggerCourse.com  Fact Sheet for Healthcare Providers:  SeriousBroker.it  This test is no t yet approved or cleared by the [place] FDA and  has been authorized for detection and/or diagnosis of SARS-CoV-2 by FDA under an Emergency Use Authorization (EUA). This EUA will remain  in effect (meaning this test can be used) for the duration of the COVID-19 declaration under Section 564(b)(1) of the Act, 21 U.S.C.section 360bbb-3(b)(1), unless the authorization is terminated  or revoked sooner.       Influenza A by PCR NEGATIVE NEGATIVE Final   Influenza B by PCR NEGATIVE NEGATIVE  Final    Comment: (NOTE) The Xpert Xpress SARS-CoV-2/FLU/RSV plus assay is intended as an aid in the diagnosis of influenza from Nasopharyngeal swab specimens and should not be used as a sole basis for treatment. Nasal washings and aspirates are unacceptable for Xpert Xpress SARS-CoV-2/FLU/RSV testing.  Fact Sheet for Patients: BloggerCourse.com  Fact Sheet for Healthcare Providers: SeriousBroker.it  This test is not yet approved or cleared by the [place] FDA and has been authorized for detection and/or diagnosis of SARS-CoV-2 by FDA under an Emergency Use Authorization (EUA). This EUA will remain in effect (meaning this test can be used) for the duration of the COVID-19 declaration under Section 564(b)(1) of the Act, 21 U.S.C. section 360bbb-3(b)(1), unless the authorization is terminated or revoked.  Performed at [facility], [address], [place], [place] [address]          Radiology Studies: CT ABDOMEN PELVIS WO CONTRAST  Result Date: 12/15/2020 CLINICAL DATA:  Bowel obstruction suspected.  Abdominal pain. EXAM: CT ABDOMEN AND PELVIS WITHOUT CONTRAST TECHNIQUE: Multidetector CT imaging of the abdomen and pelvis was performed following the standard protocol without IV contrast. COMPARISON:  CT dated November 26, 2020. FINDINGS: Lower chest: There is chronic scarring at the lung bases. There are trace bilateral pleural effusions.The heart is enlarged. Hepatobiliary: The liver is normal. Normal gallbladder.There is no biliary ductal dilation. Pancreas: Normal contours without ductal dilatation. No peripancreatic fluid collection. Spleen: Unremarkable. Adrenals/Urinary Tract: --Adrenal glands: Unremarkable. --Right kidney/ureter: No hydronephrosis or radiopaque kidney stones. --Left kidney/ureter: No hydronephrosis or radiopaque kidney stones. --Urinary bladder: Unremarkable. Stomach/Bowel: --Stomach/Duodenum: No hiatal  hernia or other gastric abnormality. Normal duodenal course and caliber. --Small bowel: Unremarkable. --Colon: Rectosigmoid diverticulosis without acute inflammation. Again noted is significant narrowing at the hepatic flexure, chronic across prior studies. There is a large amount of stool in the transverse colon and right hemicolon. --Appendix: Normal. Vascular/Lymphatic: Atherosclerotic calcification is present within the non-aneurysmal abdominal aorta, without hemodynamically significant stenosis. --No retroperitoneal lymphadenopathy. --No mesenteric lymphadenopathy. --No pelvic or inguinal lymphadenopathy. Reproductive: Unremarkable Other: No ascites or free air. The abdominal wall is normal. Musculoskeletal. A chronic appearing compression fracture is noted of the L5 vertebral body. IMPRESSION: 1. No acute abdominopelvic abnormality. 2. Again noted is significant narrowing at the hepatic flexure of the colon, chronic across prior studies. 3. Large amount of stool in the transverse colon and right hemicolon. 4. Rectosigmoid diverticulosis without acute inflammation. 5. Trace bilateral pleural  effusions with chronic scarring at the lung bases. Aortic Atherosclerosis (ICD10-I70.0). Electronically Signed   By: Katherine Mantle M.D.   On: 12/15/2020 16:31   CT Head Wo Contrast  Result Date: 12/15/2020 CLINICAL DATA:  Mental status changes, altered mental status, abdominal pain, recent small-bowel obstruction just released from hospital 4 days ago EXAM: CT HEAD WITHOUT CONTRAST TECHNIQUE: Contiguous axial images were obtained from the base of the skull through the vertex without intravenous contrast. COMPARISON:  06/16/2019 FINDINGS: Brain: Generalized atrophy. Cavum septum pellucidum. Otherwise normal ventricular morphology. No midline shift or mass effect. Small vessel chronic ischemic changes of deep cerebral white matter. No intracranial hemorrhage, mass lesion, evidence of acute infarction, or extra-axial  fluid collection. Vascular: Atherosclerotic calcifications of internal carotid arteries at skull base Skull: Intact Sinuses/Orbits: Clear Other: N/A IMPRESSION: Atrophy with small vessel chronic ischemic changes of deep cerebral white matter. No acute intracranial abnormalities. Electronically Signed   By: Ulyses Southward M.D.   On: 12/15/2020 16:32   MR BRAIN WO CONTRAST  Result Date: 12/15/2020 CLINICAL DATA:  Mental status changes of unknown cause. EXAM: MRI HEAD WITHOUT CONTRAST TECHNIQUE: Multiplanar, multiecho pulse sequences of the brain and surrounding structures were obtained without intravenous contrast. COMPARISON:  Head CT same day FINDINGS: Brain: Diffusion imaging shows a tiny acute infarction within the left cerebellum. No other acute finding. Mild chronic small-vessel ischemic changes of the pons. Few old small vessel cerebellar infarctions, including 1 with hemosiderin deposition on the right. Cerebral hemispheres show old infarction in the right basal ganglia and radiating white matter tracts. Old small vessel thalamic infarctions. There is confluent chronic small vessel disease throughout the hemispheric white matter. No large vessel territory infarction. No mass lesion, hydrocephalus or extra-axial collection. Numerous foci of hemosiderin deposition associated with the old small vessel infarctions. Vascular: Major vessels at the base of the brain show flow. 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 Hemosiderin deposition associated with many of the old small vessel infarctions. There appears to be a tiny acute infarction in the left lateral cerebellum. It would be surprising if this were the cause of the clinical presentation, as one might expect this to be subclinical. Electronically Signed   By: Paulina Fusi M.D.   On: 12/15/2020 21:35   DG Chest Portable 1  View  Result Date: 12/15/2020 CLINICAL DATA:  Altered mental status, oriented to name only, unable to follow commands, discharge from hospital on Tuesday for small-bowel obstruction, last seen normal yesterday EXAM: PORTABLE CHEST 1 VIEW COMPARISON:  Portable exam 2000 hours compared to 11/22/2020 FINDINGS: Enlargement of cardiac silhouette. Mediastinal contours and pulmonary vascularity normal. Minimal LEFT base atelectasis. Lungs otherwise clear. No acute infiltrate, pleural effusion or pneumothorax. Bones demineralized. IMPRESSION: Enlargement of cardiac silhouette with minimal LEFT basilar atelectasis. Electronically Signed   By: Ulyses Southward M.D.   On: 12/15/2020 20:09        Scheduled Meds: .  stroke: mapping our early stages of recovery book   Does not apply Once  . aspirin EC  325 mg Oral Daily  . carvedilol  25 mg Oral BID  . cloNIDine  0.1 mg Oral BID  . docusate sodium  100 mg Oral BID  . DULoxetine  60 mg Oral QHS  . enoxaparin (LOVENOX) injection  40 mg Subcutaneous Q24H  . hydroxychloroquine  200 mg Oral Daily  . isosorbide mononitrate  30 mg Oral Daily  .  lactulose  20 g Oral BID  . mirabegron ER  25 mg Oral Daily  . multivitamin with minerals  1 tablet Oral Daily  . pantoprazole  40 mg Oral Daily  . [START ON 12/17/2020] QUEtiapine  50 mg Oral Daily  . tamsulosin  0.4 mg Oral QPC supper   Continuous Infusions: . dextrose 5% lactated ringers 75 mL/hr at 12/16/20 0110     LOS: 0 days    Time spent: 35 minutes    Dorcas Carrow, MD [facility] Pager [phone]

## 2020-12-16 NOTE — ED Notes (Signed)
Called patient daughter [name] [phone] update given.

## 2020-12-16 NOTE — ED Notes (Signed)
Service Resource called @ 1803-Dinner Tray Ordered. 

## 2020-12-16 NOTE — ED Notes (Signed)
Lunch Tray Ordered @ 1016. 

## 2020-12-16 NOTE — ED Notes (Addendum)
Notified ZierlCamillo Flaming, DO of pt's c/o heartburn. Provided a verbal order for Maalox 40ml PO x one.

## 2020-12-16 NOTE — Evaluation (Addendum)
Occupational Therapy Evaluation Patient Details Name: Michelle Graves MRN: [id number] DOB: [birth year] Today's Date: 12/16/2020    History of Present Illness Michelle Graves is a 70 y.o. female with history h/o hypertension, CKD stage IIIb, CVA in 2020, rheumatoid arthritis, recently admitted 1/31-12/01/20 for urinary retention related AKI and colonic stricture treated medically/required colonoscopy, again admitted 2/12-2/15 for recurrent AKI/urinary retention and discharged with Foley catheter to home with home health PT, now presents with confusion x2 days.  Patient in fact was seen on 2/17 in ED as she had pulled out Foley catheter at home. 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 Currently she is setup/S- Max A for basic ADLs. She will benefit from acute OT with follow up at SNF unless she can have 24 S/A at home initially until family feels safe to leave her home alone.    Follow Up Recommendations  SNF;Supervision/Assistance - 24 hour (unless 24 hour S/A at home then [facility])    Equipment Recommendations  None recommended by OT       Precautions / Restrictions Precautions Precautions: Fall Precaution Comments: watch BP (HTN) Restrictions Weight Bearing Restrictions: No      Mobility Bed Mobility Overal bed mobility: Needs Assistance Bed Mobility: Supine to Sit;Sit to Supine     Supine to sit: Mod assist;HOB elevated (for legs and trunk) Sit to supine: Min assist (for legs)        Transfers Overall transfer level: Needs assistance Equipment used: 1 person hand held assist Transfers: Sit to/from [employer] Sit to Stand: Min assist Stand pivot transfers: Min assist            Balance  Overall balance assessment: Needs assistance Sitting-balance support: No upper extremity supported;Feet supported Sitting balance-[name]: Poor Sitting balance - Comments: Poor to fair at edge of strecher   Standing balance support: Bilateral upper extremity supported Standing balance-[name]: Poor                             ADL either performed or assessed with clinical judgement   ADL Overall ADL's : Needs assistance/impaired Eating/Feeding: Independent;Sitting   Grooming: Supervision/safety;Set up;Sitting   Upper Body Bathing: Supervision/ safety;Set up;Sitting   Lower Body Bathing: Moderate assistance Lower Body Bathing Details (indicate cue type and reason): min A sit<>stand Upper Body Dressing : Minimal assistance;Sitting   Lower Body Dressing: Maximal assistance Lower Body Dressing Details (indicate cue type and reason): min A sit<>stand Toilet Transfer: Minimal assistance;Stand-pivot Toilet Transfer Details (indicate cue type and reason): stretcher>chair Toileting- Clothing Manipulation and Hygiene: Moderate assistance Toileting - Clothing Manipulation Details (indicate cue type and reason): min A sit<>stand             Vision Baseline Vision/History: Wears glasses Wears Glasses: At all times Patient Visual Report: No change from baseline              Pertinent Vitals/Pain Pain Assessment: 0-10 Faces Pain Scale: Hurts worst Pain Location: head and low back Pain Descriptors / Indicators: Sore;Constant ("feels like my head is going to burst") Pain Intervention(s): Monitored during session     Hand Dominance Left   Extremity/Trunk  Assessment Upper Extremity Assessment Upper Extremity Assessment: Overall WFL for tasks assessed           Communication Communication Communication: No difficulties   Cognition Arousal/Alertness: Awake/alert Behavior During Therapy: WFL for tasks assessed/performed Overall Cognitive Status: No  family/caregiver present to determine baseline cognitive functioning Area of Impairment: Safety/judgement                         Safety/Judgement: Decreased awareness of safety     General Comments: slow to answer questions at times, moves slowly              Home Living Family/patient expects to be discharged to:: Private residence Living Arrangements: Alone Available Help at Discharge: Family;Available PRN/intermittently Type of Home: Apartment Home Access: Elevator     Home Layout: One level     Bathroom Shower/Tub: Chief Strategy Officer: Handicapped height Bathroom Accessibility: Yes   Home Equipment: Environmental consultant - 2 wheels;Walker - 4 wheels;Cane - quad;Shower seat;Bedside commode;Wheelchair - manual;Grab bars - toilet;Grab bars - tub/shower;Toilet riser;Other (comment)   Additional Comments: Patients daughter lives around the corner       Prior Functioning/Environment Level of Independence: Independent with assistive device(s)        Comments: Pt uses RW inside for mobility, Rollator in the community. Pt reports Modified Independent with ADLs, has shower chair to sit on as needed. Goes shopping in community with sister        OT Problem List: Decreased strength;Decreased activity tolerance;Impaired balance (sitting and/or standing);Pain      OT Treatment/Interventions: Self-care/ADL training;DME and/or AE instruction;Patient/family education;Balance training    OT Goals(Current goals can be found in the care plan section) Acute Rehab OT Goals Patient Stated Goal: to go home OT Goal Formulation: With patient Time For Goal Achievement: 12/30/20 Potential to Achieve Goals: Good ADL Goals Pt Will Perform Grooming: Independently;standing Pt Will Perform Upper Body Bathing: Independently;sitting Pt Will Perform Lower Body Bathing: Independently;sit to/from stand Pt Will Perform Upper Body Dressing: Independently;sitting Pt Will Perform Lower  Body Dressing: Independently;sit to/from stand Pt Will Transfer to Toilet: Independently;ambulating;bedside commode (over toilet) Pt Will Perform Toileting - Clothing Manipulation and hygiene: Independently;sit to/from stand Additional ADL Goal #1: Pt will be independent in and OOB for basic ADLs Additional ADL Goal #2: Pt will show safety awareness when doing basic ADLs  OT Frequency: Min 2X/week   Barriers to D/C: Decreased caregiver support             AM-PAC OT "6 Clicks" Daily Activity     Outcome Measure Help from another person eating meals?: A Little Help from another person taking care of personal grooming?: A Little Help from another person toileting, which includes using toliet, bedpan, or urinal?: A Lot Help from another person bathing (including washing, rinsing, drying)?: A Lot Help from another person to put on and taking off regular upper body clothing?: A Little Help from another person to put on and taking off regular lower body clothing?: A Lot 6 Click Score: 15   End of Session Equipment Utilized During Treatment: Gait belt  Activity Tolerance: Patient limited by fatigue;Patient limited by pain Patient left: in bed;with call bell/phone within reach  OT Visit Diagnosis: Unsteadiness on feet (R26.81);Other abnormalities of gait and mobility (R26.89);Muscle weakness (generalized) (M62.81);Other symptoms and signs involving cognitive function;Pain Pain - part of body:  (head and low back)  Time: 5621-3086 OT Time Calculation (min): 33 min Charges:  OT General Charges $OT Visit: 1 Visit OT Evaluation $OT Eval Moderate Complexity: 1 Mod OT Treatments $Self Care/Home Management : 8-22 mins  Ignacia Palma, OTR/L Acute [employer] Pager [phone] Office [phone]    Evette Georges 12/16/2020, 10:30 AM

## 2020-12-16 NOTE — ED Notes (Signed)
Breakfast ordered 

## 2020-12-16 NOTE — ED Notes (Signed)
Pt Was eating some jello after taking all of her meds and began throwing up.  Pt prob threw up 300 cc's of fluid according to the emesis bag.  Unsure how much of her medicines she kept down or absorbed.  Recommend evaluating BP and possible need for new medications based on how her BP responds.

## 2020-12-16 NOTE — ED Notes (Signed)
Attempted to give reportx1 

## 2020-12-16 NOTE — Progress Notes (Signed)
*  PRELIMINARY RESULTS* Echocardiogram 2D Echocardiogram has been performed.  Stacey Drain 12/16/2020, 3:39 PM

## 2020-12-17 DIAGNOSIS — N1832 Chronic kidney disease, stage 3b: Secondary | ICD-10-CM | POA: Diagnosis present

## 2020-12-17 DIAGNOSIS — G9340 Encephalopathy, unspecified: Secondary | ICD-10-CM | POA: Diagnosis not present

## 2020-12-17 DIAGNOSIS — R339 Retention of urine, unspecified: Secondary | ICD-10-CM | POA: Diagnosis present

## 2020-12-17 DIAGNOSIS — Z8249 Family history of ischemic heart disease and other diseases of the circulatory system: Secondary | ICD-10-CM | POA: Diagnosis not present

## 2020-12-17 DIAGNOSIS — Z79899 Other long term (current) drug therapy: Secondary | ICD-10-CM | POA: Diagnosis not present

## 2020-12-17 DIAGNOSIS — Z8673 Personal history of transient ischemic attack (TIA), and cerebral infarction without residual deficits: Secondary | ICD-10-CM | POA: Diagnosis not present

## 2020-12-17 DIAGNOSIS — M069 Rheumatoid arthritis, unspecified: Secondary | ICD-10-CM | POA: Diagnosis present

## 2020-12-17 DIAGNOSIS — K56699 Other intestinal obstruction unspecified as to partial versus complete obstruction: Secondary | ICD-10-CM | POA: Diagnosis present

## 2020-12-17 DIAGNOSIS — N179 Acute kidney failure, unspecified: Secondary | ICD-10-CM | POA: Diagnosis present

## 2020-12-17 DIAGNOSIS — G459 Transient cerebral ischemic attack, unspecified: Secondary | ICD-10-CM | POA: Diagnosis present

## 2020-12-17 DIAGNOSIS — E2740 Unspecified adrenocortical insufficiency: Secondary | ICD-10-CM | POA: Diagnosis present

## 2020-12-17 DIAGNOSIS — M05749 Rheumatoid arthritis with rheumatoid factor of unspecified hand without organ or systems involvement: Secondary | ICD-10-CM | POA: Diagnosis not present

## 2020-12-17 DIAGNOSIS — Z7952 Long term (current) use of systemic steroids: Secondary | ICD-10-CM | POA: Diagnosis not present

## 2020-12-17 DIAGNOSIS — D631 Anemia in chronic kidney disease: Secondary | ICD-10-CM | POA: Diagnosis present

## 2020-12-17 DIAGNOSIS — I639 Cerebral infarction, unspecified: Secondary | ICD-10-CM | POA: Diagnosis present

## 2020-12-17 DIAGNOSIS — Z7982 Long term (current) use of aspirin: Secondary | ICD-10-CM | POA: Diagnosis not present

## 2020-12-17 DIAGNOSIS — Z20822 Contact with and (suspected) exposure to covid-19: Secondary | ICD-10-CM | POA: Diagnosis present

## 2020-12-17 DIAGNOSIS — I4891 Unspecified atrial fibrillation: Secondary | ICD-10-CM | POA: Diagnosis present

## 2020-12-17 DIAGNOSIS — I6782 Cerebral ischemia: Secondary | ICD-10-CM | POA: Diagnosis present

## 2020-12-17 DIAGNOSIS — Z87440 Personal history of urinary (tract) infections: Secondary | ICD-10-CM | POA: Diagnosis not present

## 2020-12-17 DIAGNOSIS — I1310 Hypertensive heart and chronic kidney disease without heart failure, with stage 1 through stage 4 chronic kidney disease, or unspecified chronic kidney disease: Secondary | ICD-10-CM | POA: Diagnosis present

## 2020-12-17 DIAGNOSIS — E162 Hypoglycemia, unspecified: Secondary | ICD-10-CM | POA: Diagnosis present

## 2020-12-17 DIAGNOSIS — G9341 Metabolic encephalopathy: Secondary | ICD-10-CM | POA: Diagnosis present

## 2020-12-17 DIAGNOSIS — R29703 NIHSS score 3: Secondary | ICD-10-CM | POA: Diagnosis not present

## 2020-12-17 DIAGNOSIS — R4182 Altered mental status, unspecified: Secondary | ICD-10-CM | POA: Diagnosis present

## 2020-12-17 DIAGNOSIS — T50915A Adverse effect of multiple unspecified drugs, medicaments and biological substances, initial encounter: Secondary | ICD-10-CM | POA: Diagnosis present

## 2020-12-17 DIAGNOSIS — G928 Other toxic encephalopathy: Secondary | ICD-10-CM | POA: Diagnosis present

## 2020-12-17 LAB — COMPREHENSIVE METABOLIC PANEL
ALT: 12 U/L (ref 0–44)
AST: 14 U/L — ABNORMAL LOW (ref 15–41)
Albumin: 2.8 g/dL — ABNORMAL LOW (ref 3.5–5.0)
Alkaline Phosphatase: 52 U/L (ref 38–126)
Anion gap: 8 (ref 5–15)
BUN: 10 mg/dL (ref 8–23)
CO2: 23 mmol/L (ref 22–32)
Calcium: 8.5 mg/dL — ABNORMAL LOW (ref 8.9–10.3)
Chloride: 108 mmol/L (ref 98–111)
Creatinine, Ser: 1.57 mg/dL — ABNORMAL HIGH (ref 0.44–1.00)
GFR, Estimated: 36 mL/min — ABNORMAL LOW (ref >60)
Glucose, Bld: 108 mg/dL — ABNORMAL HIGH (ref 70–99)
Potassium: 3.6 mmol/L (ref 3.5–5.1)
Sodium: 139 mmol/L (ref 135–145)
Total Bilirubin: 0.6 mg/dL (ref 0.3–1.2)
Total Protein: 5.6 g/dL — ABNORMAL LOW (ref 6.5–8.1)

## 2020-12-17 LAB — URINE CULTURE: Culture: >=100000 — AB

## 2020-12-17 LAB — CBC WITH DIFFERENTIAL/PLATELET
Abs Immature Granulocytes: 0.06 10*3/uL (ref 0.00–0.07)
Basophils Absolute: 0 10*3/uL (ref 0.0–0.1)
Basophils Relative: 0 %
Eosinophils Absolute: 0.3 10*3/uL (ref 0.0–0.5)
Eosinophils Relative: 4 %
HCT: 30.2 % — ABNORMAL LOW (ref 36.0–46.0)
Hemoglobin: 10.2 g/dL — ABNORMAL LOW (ref 12.0–15.0)
Immature Granulocytes: 1 %
Lymphocytes Relative: 35 %
Lymphs Abs: 2.5 10*3/uL (ref 0.7–4.0)
MCH: 32 pg (ref 26.0–34.0)
MCHC: 33.8 g/dL (ref 30.0–36.0)
MCV: 94.7 fL (ref 80.0–100.0)
Monocytes Absolute: 0.7 10*3/uL (ref 0.1–1.0)
Monocytes Relative: 10 %
Neutro Abs: 3.5 10*3/uL (ref 1.7–7.7)
Neutrophils Relative %: 50 %
Platelets: 181 10*3/uL (ref 150–400)
RBC: 3.19 MIL/uL — ABNORMAL LOW (ref 3.87–5.11)
RDW: 14.1 % (ref 11.5–15.5)
WBC: 7 10*3/uL (ref 4.0–10.5)
nRBC: 0 % (ref 0.0–0.2)

## 2020-12-17 LAB — GLUCOSE, CAPILLARY
Glucose-Capillary: 100 mg/dL — ABNORMAL HIGH (ref 70–99)
Glucose-Capillary: 98 mg/dL (ref 70–99)
Glucose-Capillary: 106 mg/dL — ABNORMAL HIGH (ref 70–99)
Glucose-Capillary: 99 mg/dL (ref 70–99)
Glucose-Capillary: 95 mg/dL (ref 70–99)

## 2020-12-17 LAB — MAGNESIUM: Magnesium: 1.4 mg/dL — ABNORMAL LOW (ref 1.7–2.4)

## 2020-12-17 LAB — CORTISOL-AM, BLOOD: Cortisol - AM: 4.4 ug/dL — ABNORMAL LOW (ref 6.7–22.6)

## 2020-12-17 LAB — VITAMIN B12: Vitamin B-12: 1340 pg/mL — ABNORMAL HIGH (ref 180–914)

## 2020-12-17 LAB — FOLATE: Folate: 23.6 ng/mL (ref >5.9)

## 2020-12-17 LAB — PHOSPHORUS: Phosphorus: 3.3 mg/dL (ref 2.5–4.6)

## 2020-12-17 MED ORDER — FLEET ENEMA 7-19 GM/118ML RE ENEM
1.0000 | ENEMA | Freq: Once | RECTAL | Status: AC
  Administered 2020-12-17: 1 via RECTAL
  Filled 2020-12-17: qty 1

## 2020-12-17 MED ORDER — MAGNESIUM SULFATE 2 GM/50ML IV SOLN
2.0000 g | Freq: Once | INTRAVENOUS | Status: AC
  Administered 2020-12-17: 2 g via INTRAVENOUS
  Filled 2020-12-17: qty 50

## 2020-12-17 MED ORDER — COSYNTROPIN 0.25 MG IJ SOLR
0.2500 mg | Freq: Once | INTRAMUSCULAR | Status: DC
  Administered 2020-12-18: 0.25 mg via INTRAVENOUS
  Filled 2020-12-17: qty 0.25

## 2020-12-17 NOTE — Progress Notes (Signed)
PROGRESS NOTE    Michelle Graves  OMV:[id number] DOB: 1951-10-23 DOA: 12/15/2020 PCP: [facility]    Brief Narrative:  70 year old female with history of hypertension, chronic kidney disease stage IIIb with baseline creatinine about 1.4, history of TIA and stroke 2020, rheumatoid arthritis on Plaquenil and recent multiple hospitalization presented back to hospital 1 more time with altered mental status.  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 1/31-2/5, admitted for urinary retention, acute kidney injury and colonic stricture, colonoscopy and medical management. 2/12-2/15, recurrent acute kidney injury and urinary retention creatinine as high as 5.8 and discharged home with Foley catheter with home health PT. 2/17, Foley catheter came out at home, presented to ER and found to have normalized renal function and urinating well so discharged. 2/19, brought back by her daughter stating that patient is more confused than usual.  Patient herself is poor historian and not sure what is going on with her. In the emergency room, blood pressures 200, CT head normal.  Creatinine 1.58 which is about baseline.  No urinary retention.  CT abdomen pelvis with colonic stricture that is known with large amount of stool along the transverse colon in the right hemicolon.  No evidence of active infection.  Subsequent MRI showed very small subacute left cerebellar stroke. Admitted due to altered mental status.   Assessment & Plan:   Active Problems:   TIA (transient ischemic attack)  Altered mental status/acute metabolic encephalopathy: Suspect multifactorial with recent multiple medical illnesses and hospitalizations. MRI brain with subacute cerebellar stroke, too small to cause any somatic symptoms. Blood sugars 54 on arrival, patient is not diabetic.  Suspect patient may suffer from episodic hypoglycemia. Polypharmacy  suspected, delirium due to acute illness is suspected. Patient is stabilizing.  We will continue to work on causes. Patient has no evidence of active infection. Urinary retention has improved. Mental status has improved.  Start mobilizing.  Hypoglycemia: Presented with blood sugars 54.  She is not diabetic.  A1c 6.3.  Not on any hypoglycemic agents.  She is on chronic hydroxychloroquine and that can cause hypoglycemia. Morning cortisol is less than 4.  We will do ACTH stimulation test. We will check frequent blood sugars to see if she gets nocturnal hypoglycemia. Adequate nutrition, high carb diet. Discontinue dextrose to see if she can maintain her blood sugars.  Acute/subacute cerebellar stroke: Clinical findings, metabolic encephalopathy, however likely not contributed by recent stroke. CT head findings, normal. MRI of the brain, very small area of left lateral cerebellum infarction. Carotid Doppler, pending. 2D echocardiogram, pending. Antiplatelet therapy, aspirin. LDL 90.  Goal is less than 70.  We will start patient on statin.  Hemoglobin A1c 6.3.  No indication for treatment. DVT prophylaxis, Lovenox. Therapy recommendations, skilled nursing facility. We will start aggressive blood pressure treatment. Discussed with neurology, recommended sending for outpatient follow-up.  Accelerated hypertension: Blood pressures elevated on presentation.  She is on multiple blood pressure medications.  No indication for permissive hypertension. Resume all home medications, will continue to uptitrate medications.   Currently controlled.  Abdominal pain/chronic constipation/benign colonic stricture: Currently without evidence of bowel obstruction.  Start patient on aggressive laxative. No bowel movement yet.  She will receive 1 dose of Fleet enema today.  Hypomagnesemia: Replace aggressively and monitor levels.  Chronic kidney disease stage IIIb: Improved to baseline.  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  Abnormal urine cultures: Urine culture growing staph epidermidis.  Asymptomatic.  Will not treat.  Patient has received multiple antibiotics recently.   DVT prophylaxis: enoxaparin (LOVENOX) injection 40 mg Start: 12/15/20 2200   Code Status: Full code Family Communication: Patient's daughter on the phone. Disposition Plan: Status is: Observation  The patient will require care spanning > 2 midnights and should be moved to inpatient because: Altered mental status and Inpatient level of care appropriate due to severity of illness  Dispo: The patient is from: Home              Anticipated d/c is to: SNF versus home with home health.              Anticipated d/c date is: 2 days              Patient currently is not medically stable to d/c.   Difficult to place patient No   Patient is clinically improving, however is still on dextrose infusion to keep her blood sugars at safe level.  Will challenge her with oral diet today.  Will discontinue dextrose and challenge whether she can keep up with the blood sugars.  She will still need monitoring in the hospital. Mobilize with PT OT and update discharge recommendations.      Consultants:   Neurology, curbside consult  Procedures:   None  Antimicrobials:   None   Subjective: Patient seen and examined.  She is more awake.  Denies any complaints.  Eager to eat.  No bowel movement for last 48 hours. She does not know for sure why she was having confusion, however she feels like she is back to herself now. She thinks she can go home and does not need to go to skilled nursing facility.  Objective: Vitals:   12/16/20 2319 12/17/20 0618 12/17/20 0819 12/17/20 0830  BP: (!) 150/91 (!) 148/90 (!) 148/104 (!) 148/104  Pulse: 70 65  64  Resp: Temp: 98.4 F (36.9 C) 98.4 F (36.9 C)  98.4 F (36.9 C)  TempSrc:  Oral Oral  Oral  SpO2: 98% 100%  98%  Weight:      Height:   (1.702 m)      Intake/Output Summary (Last 24 hours) at 12/17/2020 1020 Last data filed at 12/17/2020 0436 Gross per 24 hour  Intake 1448.44 ml  Output --  Net 1448.44 ml   Filed Weights   12/15/20 2137  Weight: 78.5 kg    Examination:  General exam: Appears calm and comfortable  Alert awake and oriented x4.  No more confusion. Respiratory system: Clear to auscultation. Respiratory effort normal.  No added sounds. Cardiovascular system: S1 & S2 heard, RRR. No JVD, murmurs, rubs, gallops or clicks. No pedal edema. Gastrointestinal system: Abdomen is nondistended, soft and nontender. No organomegaly or masses felt. Normal bowel sounds heard. Central nervous system: Alert and oriented x4.  No focal neurological deficits.  Speech is fluent. Extremities: Symmetric 5 x 5 power. Skin: No rashes, lesions or ulcers Psychiatry: Judgement and insight appear compromised.    Data Reviewed: I have personally reviewed following labs and imaging studies  CBC: Recent Labs  Lab 12/11/20 0755 12/15/20 1150 12/15/20 2334 12/17/20 0253  WBC 8.3 9.5 8.3 7.0  NEUTROABS 4.3  --   --  3.5  HGB 9.2* 11.2* 10.9* 10.2*  HCT 30.1* 35.6* 32.6* 30.2*  MCV 97.7 97.3 93.4 94.7  PLT 181 215 209 181   Basic Metabolic  Panel: Recent Labs  Lab 12/11/20 0755 12/15/20 1150 12/15/20 2334 12/17/20 0253  NA 139 144  --  139  K 4.7 3.8  --  3.6  CL 113* 113*  --  108  CO2 18* 19*  --  23  GLUCOSE 117* 75  --  108*  BUN 33* 14  --  10  CREATININE 1.81* 1.58* 1.51* 1.57*  CALCIUM 8.4* 9.2  --  8.5*  MG 1.4*  --   --  1.4*  PHOS  --   --   --  3.3   GFR: Estimated Creatinine Clearance: 37 mL/min (A) (by C-G formula based on SCr of 1.57 mg/dL (H)). Liver Function Tests: Recent Labs  Lab 12/15/20 1150 12/17/20 0253  AST 17 14*  ALT 14 12  ALKPHOS 63 52  BILITOT 0.6 0.6  PROT 6.6 5.6*  ALBUMIN 3.5 2.8*   No results for  input(s): LIPASE, AMYLASE in the last 168 hours. No results for input(s): AMMONIA in the last 168 hours. Coagulation Profile: No results for input(s): INR, PROTIME in the last 168 hours. Cardiac Enzymes: No results for input(s): CKTOTAL, CKMB, CKMBINDEX, TROPONINI in the last 168 hours. BNP (last 3 results) No results for input(s): PROBNP in the last 8760 hours. HbA1C: Recent Labs    12/16/20 0223  HGBA1C 6.3*   CBG: Recent Labs  Lab 12/16/20 1651 12/16/20 1653 12/16/20 2312 12/17/20 0458 12/17/20 0749  GLUCAP 224* 103* 96 100* 98   Lipid Profile: Recent Labs    12/16/20 0223  CHOL 179  HDL 75  LDLCALC 93  TRIG 55  CHOLHDL 2.4   Thyroid Function Tests: No results for input(s): TSH, T4TOTAL, FREET4, T3FREE, THYROIDAB in the last 72 hours. Anemia Panel: Recent Labs    12/17/20 0253  VITAMINB12 1,340*  FOLATE 23.6   Sepsis Labs: No results for input(s): PROCALCITON, LATICACIDVEN in the last 168 hours.  Recent Results (from the past 240 hour(s))  SARS CORONAVIRUS 2 (TAT 6-24 HRS) Nasopharyngeal Nasopharyngeal Swab     Status: None   Collection Time: 12/08/20  4:03 PM   Specimen: Nasopharyngeal Swab  Result Value Ref Range Status   SARS Coronavirus 2 NEGATIVE NEGATIVE Final    Comment: (NOTE) SARS-CoV-2 target nucleic acids are NOT DETECTED.  The SARS-CoV-2 RNA is generally detectable in upper and lower respiratory specimens during the acute phase of infection. Negative results do not preclude SARS-CoV-2 infection, do not rule out co-infections with other pathogens, and should not be used as the sole basis for treatment or other patient management decisions. Negative results must be combined with clinical observations, patient history, and epidemiological information. The expected result is Negative.  Fact Sheet for Patients: HairSlick.no  Fact Sheet for Healthcare Providers: quierodirigir.com  This  test is not yet approved or cleared by the [place] FDA and  has been authorized for detection and/or diagnosis of SARS-CoV-2 by FDA under an Emergency Use Authorization (EUA). This EUA will remain  in effect (meaning this test can be used) for the duration of the COVID-19 declaration under Se ction 564(b)(1) of the Act, 21 U.S.C. section 360bbb-3(b)(1), unless the authorization is terminated or revoked sooner.  Performed at [facility], [address] [place], [place] [address]   Urine Culture     Status: None   Collection Time: 12/08/20  6:37 PM   Specimen: Urine, Catheterized  Result Value Ref Range Status   Specimen Description URINE, CATHETERIZED  Final   Special Requests NONE  Final   Culture   Final    NO GROWTH Performed at [facility], [address], [place], [place] [address]    Report Status 12/09/2020 FINAL  Final  Urine culture     Status: Abnormal   Collection Time: 12/15/20  3:24 PM   Specimen: Urine, Random  Result Value Ref Range Status   Specimen Description URINE, RANDOM  Final   Special Requests   Final    NONE Performed at [facility], [address] [place], [place] [address]    Culture >=100,000 COLONIES/mL STAPHYLOCOCCUS EPIDERMIDIS (A)  Final   Report Status 12/17/2020 FINAL  Final   Organism ID, Bacteria STAPHYLOCOCCUS EPIDERMIDIS (A)  Final      Susceptibility   Staphylococcus epidermidis - MIC*    CIPROFLOXACIN 2 INTERMEDIATE Intermediate     GENTAMICIN >=16 RESISTANT Resistant     NITROFURANTOIN <=16 SENSITIVE Sensitive     OXACILLIN >=4 RESISTANT Resistant     TETRACYCLINE <=1 SENSITIVE Sensitive     VANCOMYCIN 1 SENSITIVE Sensitive     TRIMETH/SULFA 20 SENSITIVE Sensitive     CLINDAMYCIN 0.5 SENSITIVE Sensitive     RIFAMPIN <=0.5 SENSITIVE Sensitive     Inducible Clindamycin NEGATIVE Sensitive     * >=100,000 COLONIES/mL STAPHYLOCOCCUS EPIDERMIDIS  Resp Panel by RT-PCR (Flu A&B, Covid) Nasopharyngeal Swab      Status: None   Collection Time: 12/15/20  7:16 PM   Specimen: Nasopharyngeal Swab; Nasopharyngeal(NP) swabs in vial transport medium  Result Value Ref Range Status   SARS Coronavirus 2 by RT PCR NEGATIVE NEGATIVE Final    Comment: (NOTE) SARS-CoV-2 target nucleic acids are NOT DETECTED.  The SARS-CoV-2 RNA is generally detectable in upper respiratory specimens during the acute phase of infection. The lowest concentration of SARS-CoV-2 viral copies this assay can detect is 138 copies/mL. A negative result does not preclude SARS-Cov-2 infection and should not be used as the sole basis for treatment or other patient management decisions. A negative result may occur with  improper specimen collection/handling, submission of specimen other than nasopharyngeal swab, presence of viral mutation(s) within the areas targeted by this assay, and inadequate number of viral copies(<138 copies/mL). A negative result must be combined with clinical observations, patient history, and epidemiological information. The expected result is Negative.  Fact Sheet for Patients:  BloggerCourse.com  Fact Sheet for Healthcare Providers:  SeriousBroker.it  This test is no t yet approved or cleared by the [place] FDA and  has been authorized for detection and/or diagnosis of SARS-CoV-2 by FDA under an Emergency Use Authorization (EUA). This EUA will remain  in effect (meaning this test can be used) for the duration of the COVID-19 declaration under Section 564(b)(1) of the Act, 21 U.S.C.section 360bbb-3(b)(1), unless the authorization is terminated  or revoked sooner.       Influenza A by PCR NEGATIVE NEGATIVE Final   Influenza B by PCR NEGATIVE NEGATIVE Final    Comment: (NOTE) The Xpert Xpress SARS-CoV-2/FLU/RSV plus assay is intended as an aid in the diagnosis of influenza from Nasopharyngeal swab specimens and should not be used as a sole basis  for treatment. Nasal washings and aspirates are unacceptable for Xpert Xpress SARS-CoV-2/FLU/RSV testing.  Fact Sheet for Patients: BloggerCourse.com  Fact Sheet for Healthcare Providers: SeriousBroker.it  This test is not yet approved or cleared by the [place] FDA and has been authorized for detection and/or diagnosis of SARS-CoV-2 by FDA under an Emergency Use Authorization (EUA). This  EUA will remain in effect (meaning this test can be used) for the duration of the COVID-19 declaration under Section 564(b)(1) of the Act, 21 U.S.C. section 360bbb-3(b)(1), unless the authorization is terminated or revoked.  Performed at [facility], [address] [place], [place] [address]          Radiology Studies: CT ABDOMEN PELVIS WO CONTRAST  Result Date: 12/15/2020 CLINICAL DATA:  Bowel obstruction suspected.  Abdominal pain. EXAM: CT ABDOMEN AND PELVIS WITHOUT CONTRAST TECHNIQUE: Multidetector CT imaging of the abdomen and pelvis was performed following the standard protocol without IV contrast. COMPARISON:  CT dated November 26, 2020. FINDINGS: Lower chest: There is chronic scarring at the lung bases. There are trace bilateral pleural effusions.The heart is enlarged. Hepatobiliary: The liver is normal. Normal gallbladder.There is no biliary ductal dilation. Pancreas: Normal contours without ductal dilatation. No peripancreatic fluid collection. Spleen: Unremarkable. Adrenals/Urinary Tract: --Adrenal glands: Unremarkable. --Right kidney/ureter: No hydronephrosis or radiopaque kidney stones. --Left kidney/ureter: No hydronephrosis or radiopaque kidney stones. --Urinary bladder: Unremarkable. Stomach/Bowel: --Stomach/Duodenum: No hiatal hernia or other gastric abnormality. Normal duodenal course and caliber. --Small bowel: Unremarkable. --Colon: Rectosigmoid diverticulosis without acute inflammation. Again noted is significant  narrowing at the hepatic flexure, chronic across prior studies. There is a large amount of stool in the transverse colon and right hemicolon. --Appendix: Normal. Vascular/Lymphatic: Atherosclerotic calcification is present within the non-aneurysmal abdominal aorta, without hemodynamically significant stenosis. --No retroperitoneal lymphadenopathy. --No mesenteric lymphadenopathy. --No pelvic or inguinal lymphadenopathy. Reproductive: Unremarkable Other: No ascites or free air. The abdominal wall is normal. Musculoskeletal. A chronic appearing compression fracture is noted of the L5 vertebral body. IMPRESSION: 1. No acute abdominopelvic abnormality. 2. Again noted is significant narrowing at the hepatic flexure of the colon, chronic across prior studies. 3. Large amount of stool in the transverse colon and right hemicolon. 4. Rectosigmoid diverticulosis without acute inflammation. 5. Trace bilateral pleural effusions with chronic scarring at the lung bases. Aortic Atherosclerosis (ICD10-I70.0). Electronically Signed   By: Katherine Mantle M.D.   On: 12/15/2020 16:31   CT Head Wo Contrast  Result Date: 12/15/2020 CLINICAL DATA:  Mental status changes, altered mental status, abdominal pain, recent small-bowel obstruction just released from hospital 4 days ago EXAM: CT HEAD WITHOUT CONTRAST TECHNIQUE: Contiguous axial images were obtained from the base of the skull through the vertex without intravenous contrast. COMPARISON:  06/16/2019 FINDINGS: Brain: Generalized atrophy. Cavum septum pellucidum. Otherwise normal ventricular morphology. No midline shift or mass effect. Small vessel chronic ischemic changes of deep cerebral white matter. No intracranial hemorrhage, mass lesion, evidence of acute infarction, or extra-axial fluid collection. Vascular: Atherosclerotic calcifications of internal carotid arteries at skull base Skull: Intact Sinuses/Orbits: Clear Other: N/A IMPRESSION: Atrophy with small vessel chronic  ischemic changes of deep cerebral white matter. No acute intracranial abnormalities. Electronically Signed   By: Ulyses Southward M.D.   On: 12/15/2020 16:32   MR BRAIN WO CONTRAST  Result Date: 12/15/2020 CLINICAL DATA:  Mental status changes of unknown cause. EXAM: MRI HEAD WITHOUT CONTRAST TECHNIQUE: Multiplanar, multiecho pulse sequences of the brain and surrounding structures were obtained without intravenous contrast. COMPARISON:  Head CT same day FINDINGS: Brain: Diffusion imaging shows a tiny acute infarction within the left cerebellum. No other acute finding. Mild chronic small-vessel ischemic changes of the pons. Few old small vessel cerebellar infarctions, including 1 with hemosiderin deposition on the right. Cerebral hemispheres show old infarction in the right basal ganglia and radiating white matter tracts. Old small vessel  thalamic infarctions. There is confluent chronic small vessel disease throughout the hemispheric white matter. No large vessel territory infarction. No mass lesion, hydrocephalus or extra-axial collection. Numerous foci of hemosiderin deposition associated with the old small vessel infarctions. Vascular: Major vessels at the base of the brain show flow. 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 Hemosiderin deposition associated with many of the old small vessel infarctions. There appears to be a tiny acute infarction in the left lateral cerebellum. It would be surprising if this were the cause of the clinical presentation, as one might expect this to be subclinical. Electronically Signed   By: Paulina Fusi M.D.   On: 12/15/2020 21:35   DG Chest Portable 1 View  Result Date: 12/15/2020 CLINICAL DATA:  Altered mental status, oriented to name only, unable to follow commands, discharge from hospital on Tuesday for small-bowel obstruction, last seen  normal yesterday EXAM: PORTABLE CHEST 1 VIEW COMPARISON:  Portable exam 2000 hours compared to 11/22/2020 FINDINGS: Enlargement of cardiac silhouette. Mediastinal contours and pulmonary vascularity normal. Minimal LEFT base atelectasis. Lungs otherwise clear. No acute infiltrate, pleural effusion or pneumothorax. Bones demineralized. IMPRESSION: Enlargement of cardiac silhouette with minimal LEFT basilar atelectasis. Electronically Signed   By: Ulyses Southward M.D.   On: 12/15/2020 20:09   ECHOCARDIOGRAM COMPLETE  Result Date: 12/16/2020    ECHOCARDIOGRAM REPORT   Patient Name:   BRYANAH NURMI Date of Exam: 12/16/2020 Medical Rec #:  [id number]         Height:       67.0 in Accession #:    [id number]        Weight:       173.0 lb Date of Birth:  [birth year]          BSA:          1.901 m Patient Age:    70 years          BP:           138/95 mmHg Patient Gender: F                 HR:           75 bpm. Exam Location:  Inpatient Procedure: 2D Echo, Cardiac Doppler and Color Doppler Indications:    TIA 435.9 / G45.9  History:        Patient has prior history of Echocardiogram examinations, most                 recent 05/10/2020. Stroke, Arrythmias:Atrial Fibrillation; Risk                 Factors:Hypertension. Acute on chronic renal failure.  Sonographer:    Celesta Gentile RCS Referring Phys: [id number] Alessandra Bevels IMPRESSIONS  1. Left ventricular ejection fraction, by estimation, is 55 to 60%. The left ventricle has normal function. The left ventricle has no regional wall motion abnormalities. There is moderate concentric left ventricular hypertrophy. Left ventricular diastolic parameters are consistent with Grade I diastolic dysfunction (impaired relaxation). Elevated left atrial pressure.  2. Right ventricular systolic function is normal. The right ventricular size is normal. There is normal pulmonary artery systolic pressure. The estimated right ventricular systolic pressure is 27.4 mmHg.  3. Left atrial size was  mildly dilated.  4. The mitral valve is normal in structure. Mild mitral valve regurgitation. No evidence of mitral stenosis.  5. The aortic valve is normal  in structure. Aortic valve regurgitation is mild. Mild to moderate aortic valve sclerosis/calcification is present, without any evidence of aortic stenosis.  6. The inferior vena cava is normal in size with greater than 50% respiratory variability, suggesting right atrial pressure of 3 mmHg. FINDINGS  Left Ventricle: Left ventricular ejection fraction, by estimation, is 55 to 60%. The left ventricle has normal function. The left ventricle has no regional wall motion abnormalities. The left ventricular internal cavity size was normal in size. There is  moderate concentric left ventricular hypertrophy. Left ventricular diastolic parameters are consistent with Grade I diastolic dysfunction (impaired relaxation). Elevated left atrial pressure. Right Ventricle: The right ventricular size is normal. No increase in right ventricular wall thickness. Right ventricular systolic function is normal. There is normal pulmonary artery systolic pressure. The tricuspid regurgitant velocity is 2.47 m/s, and  with an assumed right atrial pressure of 3 mmHg, the estimated right ventricular systolic pressure is 27.4 mmHg. Left Atrium: Left atrial size was mildly dilated. Right Atrium: Right atrial size was normal in size. Pericardium: There is no evidence of pericardial effusion. Mitral Valve: The mitral valve is normal in structure. Mild mitral valve regurgitation. No evidence of mitral valve stenosis. Tricuspid Valve: The tricuspid valve is normal in structure. Tricuspid valve regurgitation is not demonstrated. No evidence of tricuspid stenosis. Aortic Valve: The aortic valve is normal in structure. Aortic valve regurgitation is mild. Aortic regurgitation PHT measures 599 msec. Mild to moderate aortic valve sclerosis/calcification is present, without any evidence of aortic  stenosis. Pulmonic Valve: The pulmonic valve was normal in structure. Pulmonic valve regurgitation is not visualized. No evidence of pulmonic stenosis. Aorta: The aortic root is normal in size and structure. Venous: The inferior vena cava is normal in size with greater than 50% respiratory variability, suggesting right atrial pressure of 3 mmHg. IAS/Shunts: No atrial level shunt detected by color flow Doppler.  LEFT VENTRICLE PLAX 2D LVIDd:         3.90 cm  Diastology LVIDs:         2.80 cm  LV e' medial:    3.59 cm/s LV PW:         1.80 cm  LV E/e' medial:  14.3 LV IVS:        1.50 cm  LV e' lateral:   4.57 cm/s LVOT diam:     2.00 cm  LV E/e' lateral: 11.2 LV SV:         57 LV SV Index:   30 LVOT Area:     3.14 cm  RIGHT VENTRICLE TAPSE (M-mode): 2.1 cm LEFT ATRIUM           Index       RIGHT ATRIUM           Index LA diam:      2.90 cm 1.53 cm/m  RA Area:     13.60 cm LA Vol (A2C): 56.3 ml 29.61 ml/m RA Volume:   28.80 ml  15.15 ml/m LA Vol (A4C): 85.1 ml 44.76 ml/m  AORTIC VALVE LVOT Vmax:   92.50 cm/s LVOT Vmean:  67.000 cm/s LVOT VTI:    0.183 m AI PHT:      599 msec  AORTA Ao Root diam: 3.60 cm MITRAL VALVE                TRICUSPID VALVE MV Area (PHT): 2.95 cm     TR Peak grad:   24.4 mmHg MV Decel Time: 257 msec  TR Vmax:        247.00 cm/s MV E velocity: 51.40 cm/s MV A velocity: 109.00 cm/s  SHUNTS MV E/A ratio:  0.47         Systemic VTI:  0.18 m                             Systemic Diam: 2.00 cm Tobias Alexander MD Electronically signed by Tobias Alexander MD Signature Date/Time: 12/16/2020/4:26:09 PM    Final         Scheduled Meds: .  stroke: mapping our early stages of recovery book   Does not apply Once  . aspirin EC  325 mg Oral Daily  . carvedilol  25 mg Oral BID  . cloNIDine  0.1 mg Oral BID  . [START ON 12/18/2020] cosyntropin  0.25 mg Intravenous Once  . docusate sodium  100 mg Oral BID  . DULoxetine  60 mg Oral QHS  . enoxaparin (LOVENOX) injection  40 mg Subcutaneous Q24H   . hydroxychloroquine  200 mg Oral Daily  . isosorbide mononitrate  30 mg Oral Daily  . lactulose  20 g Oral BID  . mirabegron ER  25 mg Oral Daily  . multivitamin with minerals  1 tablet Oral Daily  . pantoprazole  40 mg Oral Daily  . QUEtiapine  50 mg Oral Daily  . sodium phosphate  1 enema Rectal Once  . tamsulosin  0.4 mg Oral QPC supper   Continuous Infusions:    LOS: 0 days    Time spent: 32 minutes    Dorcas Carrow, MD [facility] Pager [phone]

## 2020-12-17 NOTE — Plan of Care (Signed)
  Problem: Education: Goal: Knowledge of General Education information will improve Description Including pain rating scale, medication(s)/side effects and non-pharmacologic comfort measures Outcome: Progressing   Problem: Health Behavior/Discharge Planning: Goal: Ability to manage health-related needs will improve Outcome: Progressing   Problem: Clinical Measurements: Goal: Ability to maintain clinical measurements within normal limits will improve Outcome: Progressing Goal: Will remain free from infection Outcome: Progressing Goal: Diagnostic test results will improve Outcome: Progressing Goal: Respiratory complications will improve Outcome: Progressing Goal: Cardiovascular complication will be avoided Outcome: Progressing   Problem: Activity: Goal: Risk for activity intolerance will decrease Outcome: Progressing   Problem: Nutrition: Goal: Adequate nutrition will be maintained Outcome: Progressing   Problem: Coping: Goal: Level of anxiety will decrease Outcome: Progressing   Problem: Elimination: Goal: Will not experience complications related to bowel motility Outcome: Progressing Goal: Will not experience complications related to urinary retention Outcome: Progressing   Problem: Pain Managment: Goal: General experience of comfort will improve Outcome: Progressing   Problem: Safety: Goal: Ability to remain free from injury will improve Outcome: Progressing   Problem: Skin Integrity: Goal: Risk for impaired skin integrity will decrease Outcome: Progressing   Problem: Education: Goal: Knowledge of secondary prevention will improve Outcome: Progressing Goal: Knowledge of patient specific risk factors addressed and post discharge goals established will improve Outcome: Progressing   

## 2020-12-17 NOTE — Evaluation (Signed)
Physical Therapy Evaluation Patient Details Name: Michelle Graves MRN: [id number] DOB: Dec 15, 1950 Today's Date: 12/17/2020   History of Present Illness  70 yo admitted 2/19 with AMS found to have very small left cerebellar infarct. Pt with 2 recent admissions 1/31-2/5, 2/12-2/15 related to UTI and colonic stricture. PMHx: CKD, HTN, CAD, RA, colonic stricture, CVA  Clinical Impression  Pt on arrival initially stating not wanting to get up due to recent enema but after checking with RN pt then stating BM already occurring and needed cues and instruction to get to [facility] to be able to void and assist for pericare and linen change. Pt with decreased cognition, balance and safety who lives alone and per her reports does not have assist during the day as son and daughter work. Pt would benefit from supervision throughout the day for safety and balance with possibility to long term ALF but may require ST-SNF if family unable to provide additional assist. Encouraged daily mobility with nursing staff.    Follow Up Recommendations Supervision for mobility/OOB;Home health PT;SNF (if assist available throughout the day then HHPT feasible if not SNF or potential ALF)    Equipment Recommendations  None recommended by PT    Recommendations for Other Services       Precautions / Restrictions Precautions Precautions: Fall Restrictions Weight Bearing Restrictions: No      Mobility  Bed Mobility Overal bed mobility: Needs Assistance Bed Mobility: Supine to Sit     Supine to sit: Min assist     General bed mobility comments: supervision for safety, pt reaching for single UE support to elevate trunk    Transfers Overall transfer level: Needs assistance   Transfers: Sit to/from Stand;Stand Pivot Transfers Sit to Stand: Min guard Stand pivot transfers: Min guard       General transfer comment: cues for hand placement with pt able to pivot from bed to chair with cues for  safety  Ambulation/Gait Ambulation/Gait assistance: Min guard Gait Distance (Feet): 150 Feet Assistive device: Rolling walker (2 wheeled) Gait Pattern/deviations: Step-through pattern;Trunk flexed;Decreased stride length   Gait velocity interpretation: 1.31 - 2.62 ft/sec, indicative of limited community ambulator General Gait Details: cues for direction, posture and proximity to [employer]    Modified Rankin (Stroke Patients Only)       Balance Overall balance assessment: Needs assistance Sitting-balance support: No upper extremity supported;Feet supported Sitting balance-[name]: Good Sitting balance - Comments: EOB without support   Standing balance support: Bilateral upper extremity supported Standing balance-[name]: Poor Standing balance comment: bil UE support on RW                             Pertinent Vitals/Pain Pain Assessment: No/denies pain    Home Living Family/patient expects to be discharged to:: Private residence Living Arrangements: Alone Available Help at Discharge: Family;Available PRN/intermittently Type of Home: Apartment Home Access: Elevator     Home Layout: One level Home Equipment: Walker - 2 wheels;Walker - 4 wheels;Cane - quad;Shower seat;Bedside commode;Wheelchair - manual;Grab bars - toilet;Grab bars - tub/shower;Toilet riser;Other (comment)      Prior Function Level of Independence: Independent with assistive device(s);Needs assistance   Gait / Transfers Assistance Needed: rollator in community, no AD in house  ADL's / Homemaking Assistance Needed: daughter assists with medication, cooking, cleaning. pt bathes and dresses on her own  Hand Dominance        Extremity/Trunk Assessment   Upper Extremity Assessment Upper Extremity Assessment: Overall WFL for tasks assessed    Lower Extremity Assessment Lower Extremity Assessment: Overall WFL for tasks assessed     Cervical / Trunk Assessment Cervical / Trunk Assessment: Kyphotic  Communication   Communication: No difficulties  Cognition Arousal/Alertness: Awake/alert Behavior During Therapy: WFL for tasks assessed/performed Overall Cognitive Status: Impaired/Different from baseline Area of Impairment: Memory;Safety/judgement;Problem solving;Orientation                 Orientation Level: Place;Situation   Memory: Decreased short-term memory   Safety/Judgement: Decreased awareness of deficits   Problem Solving: Slow processing General Comments: pt oriented to time not situation or place, pt unable to recall room number, could not solve simple money management      General Comments      Exercises General Exercises - Lower Extremity Long Arc Quad: AROM;Both;Seated;10 reps Hip Flexion/Marching: AROM;Both;Seated;10 reps   Assessment/Plan    PT Assessment Patient needs continued PT services  PT Problem List Decreased safety awareness;Decreased activity tolerance;Decreased cognition;Decreased balance;Decreased knowledge of use of DME       PT Treatment Interventions Gait training;DME instruction;Functional mobility training;Therapeutic activities;Therapeutic exercise;Balance training;Patient/family education;Cognitive remediation    PT Goals (Current goals can be found in the Care Plan section)  Acute Rehab PT Goals Patient Stated Goal: to go home PT Goal Formulation: With patient Time For Goal Achievement: 12/31/20 Potential to Achieve Goals: Fair    Frequency Min 3X/week   Barriers to discharge        Co-evaluation               AM-PAC PT "6 Clicks" Mobility  Outcome Measure Help needed turning from your back to your side while in a flat bed without using bedrails?: None Help needed moving from lying on your back to sitting on the side of a flat bed without using bedrails?: None Help needed moving to and from a bed to a chair (including a wheelchair)?: A  Little Help needed standing up from a chair using your arms (e.g., wheelchair or bedside chair)?: A Little Help needed to walk in hospital room?: A Little Help needed climbing 3-5 steps with a railing? : A Little 6 Click Score: 20    End of Session   Activity Tolerance: Patient tolerated treatment well Patient left: in chair;with call bell/phone within reach;with chair alarm set Nurse Communication: Mobility status PT Visit Diagnosis: Other abnormalities of gait and mobility (R26.89);Difficulty in walking, not elsewhere classified (R26.2)    Time: 4076-8088 PT Time Calculation (min) (ACUTE ONLY): 33 min   Charges:   PT Evaluation $PT Eval Moderate Complexity: 1 Mod PT Treatments $Therapeutic Activity: 8-22 mins        Alvah Gilder P, PT Acute Rehabilitation Services Pager: [phone] Office: [phone]   Enedina Finner Jajuan Skoog 12/17/2020, 12:56 PM

## 2020-12-18 ENCOUNTER — Encounter (HOSPITAL_COMMUNITY): Payer: Self-pay | Admitting: Internal Medicine

## 2020-12-18 DIAGNOSIS — E2740 Unspecified adrenocortical insufficiency: Secondary | ICD-10-CM | POA: Diagnosis present

## 2020-12-18 DIAGNOSIS — I639 Cerebral infarction, unspecified: Secondary | ICD-10-CM | POA: Diagnosis not present

## 2020-12-18 DIAGNOSIS — G9340 Encephalopathy, unspecified: Secondary | ICD-10-CM | POA: Diagnosis not present

## 2020-12-18 DIAGNOSIS — K56699 Other intestinal obstruction unspecified as to partial versus complete obstruction: Secondary | ICD-10-CM | POA: Diagnosis not present

## 2020-12-18 LAB — COMPREHENSIVE METABOLIC PANEL
ALT: 12 U/L (ref 0–44)
AST: 14 U/L — ABNORMAL LOW (ref 15–41)
Albumin: 2.9 g/dL — ABNORMAL LOW (ref 3.5–5.0)
Alkaline Phosphatase: 64 U/L (ref 38–126)
Anion gap: 9 (ref 5–15)
BUN: 11 mg/dL (ref 8–23)
CO2: 24 mmol/L (ref 22–32)
Calcium: 8.9 mg/dL (ref 8.9–10.3)
Chloride: 105 mmol/L (ref 98–111)
Creatinine, Ser: 1.59 mg/dL — ABNORMAL HIGH (ref 0.44–1.00)
GFR, Estimated: 35 mL/min — ABNORMAL LOW (ref >60)
Glucose, Bld: 95 mg/dL (ref 70–99)
Potassium: 3.9 mmol/L (ref 3.5–5.1)
Sodium: 138 mmol/L (ref 135–145)
Total Bilirubin: 0.6 mg/dL (ref 0.3–1.2)
Total Protein: 5.7 g/dL — ABNORMAL LOW (ref 6.5–8.1)

## 2020-12-18 LAB — GLUCOSE, CAPILLARY
Glucose-Capillary: 73 mg/dL (ref 70–99)
Glucose-Capillary: 140 mg/dL — ABNORMAL HIGH (ref 70–99)
Glucose-Capillary: 92 mg/dL (ref 70–99)
Glucose-Capillary: 124 mg/dL — ABNORMAL HIGH (ref 70–99)

## 2020-12-18 LAB — ACTH STIMULATION, 3 TIME POINTS
Cortisol, 30 Min: 16.6 ug/dL
Cortisol, 60 Min: 19.7 ug/dL
Cortisol, Base: 6.3 ug/dL

## 2020-12-18 LAB — SARS CORONAVIRUS 2 (TAT 6-24 HRS): SARS Coronavirus 2: NEGATIVE

## 2020-12-18 LAB — MAGNESIUM: Magnesium: 1.9 mg/dL (ref 1.7–2.4)

## 2020-12-18 MED ORDER — COSYNTROPIN 0.25 MG IJ SOLR
0.2500 mg | Freq: Once | INTRAMUSCULAR | Status: DC
  Filled 2020-12-18: qty 0.25

## 2020-12-18 MED ORDER — LACTULOSE 10 GM/15ML PO SOLN: 20.0000 g | Freq: Two times a day (BID) | ORAL | 0 refills | Status: DC

## 2020-12-18 MED ORDER — PREDNISONE 5 MG PO TABS
5.0000 mg | ORAL_TABLET | Freq: Every day | ORAL | Status: DC
  Administered 2020-12-18 – 2020-12-19 (×2): 5 mg via ORAL
  Filled 2020-12-18 (×2): qty 1

## 2020-12-18 MED ORDER — ATORVASTATIN CALCIUM 40 MG PO TABS
40.0000 mg | ORAL_TABLET | Freq: Every day | ORAL | Status: DC
Start: 2020-12-18 — End: 2020-12-19
  Administered 2020-12-18 – 2020-12-19 (×2): 40 mg via ORAL
  Filled 2020-12-18 (×2): qty 1

## 2020-12-18 MED ORDER — ATORVASTATIN CALCIUM 40 MG PO TABS: 40.0000 mg | ORAL_TABLET | Freq: Every day | ORAL | 2 refills | Status: DC

## 2020-12-18 NOTE — Plan of Care (Signed)
  Problem: Education: Goal: Knowledge of General Education information will improve Description Including pain rating scale, medication(s)/side effects and non-pharmacologic comfort measures Outcome: Progressing   Problem: Health Behavior/Discharge Planning: Goal: Ability to manage health-related needs will improve Outcome: Progressing   Problem: Clinical Measurements: Goal: Ability to maintain clinical measurements within normal limits will improve Outcome: Progressing Goal: Will remain free from infection Outcome: Progressing Goal: Diagnostic test results will improve Outcome: Progressing Goal: Respiratory complications will improve Outcome: Progressing Goal: Cardiovascular complication will be avoided Outcome: Progressing   Problem: Activity: Goal: Risk for activity intolerance will decrease Outcome: Progressing   Problem: Nutrition: Goal: Adequate nutrition will be maintained Outcome: Progressing   Problem: Coping: Goal: Level of anxiety will decrease Outcome: Progressing   Problem: Elimination: Goal: Will not experience complications related to bowel motility Outcome: Progressing Goal: Will not experience complications related to urinary retention Outcome: Progressing   Problem: Pain Managment: Goal: General experience of comfort will improve Outcome: Progressing   Problem: Safety: Goal: Ability to remain free from injury will improve Outcome: Progressing   Problem: Skin Integrity: Goal: Risk for impaired skin integrity will decrease Outcome: Progressing   Problem: Education: Goal: Knowledge of secondary prevention will improve Outcome: Progressing Goal: Knowledge of patient specific risk factors addressed and post discharge goals established will improve Outcome: Progressing   

## 2020-12-18 NOTE — Progress Notes (Signed)
This patient has social substance use history pulled from past records. She was occasional drinker and presently she does not drink any alcohol.

## 2020-12-18 NOTE — Plan of Care (Signed)
  Problem: Elimination: Goal: Will not experience complications related to bowel motility Outcome: Progressing   Problem: Safety: Goal: Ability to remain free from injury will improve Outcome: Progressing   Problem: Education: Goal: Knowledge of secondary prevention will improve Outcome: Progressing Goal: Knowledge of patient specific risk factors addressed and post discharge goals established will improve Outcome: Progressing

## 2020-12-18 NOTE — Progress Notes (Signed)
   RE:  Michelle Graves       Date of Birth: 12/04/1950      Date:   12/18/20       To Whom It May Concern:  Please be advised that the above-named patient will require a short-term nursing home stay - anticipated 30 days or less for rehabilitation and strengthening.  The plan is for return home.                 MD signature                Date

## 2020-12-18 NOTE — Evaluation (Signed)
Speech Language Pathology Evaluation Patient Details Name: Michelle Graves MRN: [id number] DOB: 10/05/1951 Today's Date: 12/18/2020 Time: 1194-1740 SLP Time Calculation (min) (ACUTE ONLY): 19 min  Problem List:  Patient Active Problem List   Diagnosis Date Noted  . Encephalopathy 12/17/2020  . TIA (transient ischemic attack) 12/15/2020  . 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 Bowel obstruction (HCC) 05/06/2020  . SIRS (systemic inflammatory response syndrome) (HCC) 05/06/2020  . UTI (urinary tract infection) 11/08/2019  . Major depressive disorder, recurrent episode, moderate (HCC) 11/08/2019  . Urinary retention 10/28/2019  . Acute on chronic kidney failure (HCC) 10/27/2019  . Malnutrition of moderate degree 10/03/2019  . Colitis 09/30/2019  . AKI (acute kidney injury) (HCC) 05/28/2019  . Nausea & vomiting 05/28/2019  . 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 Anemia, chronic disease 05/28/2019  . Swelling   . Small vessel disease, cerebrovascular   . Acute ischemic stroke (HCC)   . Weakness   . Acute left ankle pain   . Acute on chronic renal failure (HCC) 05/13/2019  . Weakness of both lower extremities 05/13/2019  . Cerebral thrombosis with cerebral infarction 03/31/2019  . New onset a-fib (HCC) 03/27/2019  . Acute kidney injury superimposed on CKD (HCC) 03/24/2019  . Hypotension 03/24/2019  . Hematemesis 03/24/2019  . Dark stools 03/24/2019  . Diverticulitis of intestine without perforation or abscess without bleeding   . Sinus tachycardia 08/27/2016  . Diverticulitis 08/26/2016  . HYPERTENSION, MALIGNANT ESSENTIAL 06/24/2007  . KIDNEY DISEASE, CHRONIC, STAGE III 06/24/2007  . Rheumatoid arthritis (HCC) 06/24/2007   Past Medical History:  Past Medical History:  Diagnosis Date  . Acute on chronic kidney failure (HCC)   . Anemia   . Arthritis   . Chest pain   . Diverticulitis   . Hypertension   . Stroke (HCC)   . UTI (urinary tract infection) 10/2019   Past Surgical  History:  Past Surgical History:  Procedure Laterality Date  . BIOPSY  04/01/2019   Procedure: BIOPSY;  Surgeon: Kathi Der, MD;  Location: [facility] ENDOSCOPY;  Service: Gastroenterology;;  . BIOPSY  10/05/2019   Procedure: BIOPSY;  Surgeon: Kathi Der, MD;  Location: [facility] ENDOSCOPY;  Service: Gastroenterology;;  . BIOPSY  05/10/2020   Procedure: BIOPSY;  Surgeon: Kathi Der, MD;  Location: [facility] ENDOSCOPY;  Service: Gastroenterology;;  . BIOPSY  11/29/2020   Procedure: BIOPSY;  Surgeon: Charlott Rakes, MD;  Location: [facility] ENDOSCOPY;  Service: Gastroenterology;;  . COLONOSCOPY N/A 11/29/2020   Procedure: COLONOSCOPY;  Surgeon: Charlott Rakes, MD;  Location: [facility] ENDOSCOPY;  Service: Gastroenterology;  Laterality: N/A;  . COLONOSCOPY WITH PROPOFOL N/A 04/01/2019   Procedure: COLONOSCOPY WITH PROPOFOL;  Surgeon: Kathi Der, MD;  Location: [facility] ENDOSCOPY;  Service: Gastroenterology;  Laterality: N/A;  . COLONOSCOPY WITH PROPOFOL N/A 10/05/2019   Procedure: COLONOSCOPY WITH PROPOFOL;  Surgeon: Kathi Der, MD;  Location: [facility] ENDOSCOPY;  Service: Gastroenterology;  Laterality: N/A;  . COLONOSCOPY WITH PROPOFOL N/A 05/10/2020   Procedure: COLONOSCOPY WITH PROPOFOL;  Surgeon: Kathi Der, MD;  Location: [facility] ENDOSCOPY;  Service: Gastroenterology;  Laterality: N/A;  . ESOPHAGOGASTRODUODENOSCOPY (EGD) WITH PROPOFOL N/A 04/01/2019   Procedure: ESOPHAGOGASTRODUODENOSCOPY (EGD) WITH PROPOFOL;  Surgeon: Kathi Der, MD;  Location: [facility] ENDOSCOPY;  Service: Gastroenterology;  Laterality: N/A;  . ESOPHAGOGASTRODUODENOSCOPY (EGD) WITH PROPOFOL N/A 10/05/2019   Procedure: ESOPHAGOGASTRODUODENOSCOPY (EGD) WITH PROPOFOL;  Surgeon: Kathi Der, MD;  Location: [facility] ENDOSCOPY;  Service: Gastroenterology;  Laterality: N/A;  . POLYPECTOMY  11/29/2020   Procedure: POLYPECTOMY;  Surgeon: Charlott Rakes, MD;  Location: [facility] ENDOSCOPY;  Service: Gastroenterology;;  . SUBMUCOSAL TATTOO INJECTION   11/29/2020   Procedure: SUBMUCOSAL TATTOO INJECTION;  Surgeon: Charlott Rakes, MD;  Location: [facility] ENDOSCOPY;  Service: Gastroenterology;;  . TONSILLECTOMY     HPI:  Pt is a 70 y.o. female admitted 2/19 with AMS found to have very small left cerebellar infarct. Pt with 2 recent admissions 1/31-2/5, 2/12-2/15 related to UTI and colonic stricture. PMHx: CKD, HTN, CAD, RA, colonic stricture, CVA.   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 Pt scored a 15 out of 30 on the SLUMS (21-26 is considered a mild impairment). The most concerning finding from today's evaluation was the pt's anticipatory awareness impairment. Pt demonstrates intellectual awareness regarding some of her impairments; however, seems unaware of how her current cognitive difficulties may impact her daily life after she leaves the hospital, especially given that she was previously living alone. This is also supported by the fact that she had difficulty in areas such as completing basic monetary problem solving skills. Pt also demonstrates difficulty in the areas of memory, sustained attention, and auditory comprehension. Based on these results, pt would benefit from continued services from SLP.    SLP Assessment  SLP Recommendation/Assessment: Patient needs continued Speech Lanaguage Pathology Services SLP Visit Diagnosis: Cognitive communication deficit (R41.841)    Follow Up Recommendations  Skilled Nursing facility    Frequency and Duration min 2x/week  2 weeks      SLP Evaluation Cognition  Overall Cognitive Status: Impaired/Different from baseline Arousal/Alertness: Awake/alert Orientation Level: Oriented to place;Oriented to time Attention: Focused Memory: Impaired Memory Impairment: Retrieval deficit;Decreased recall of new information;Decreased short term memory Awareness: Impaired Awareness  Impairment: Anticipatory impairment Problem Solving: Impaired Problem Solving Impairment: Verbal basic Safety/Judgment: Impaired       Comprehension  Auditory Comprehension Overall Auditory Comprehension: Impaired Commands: Within Functional Limits    Expression Expression Primary Mode of Expression: Verbal Verbal Expression Overall Verbal Expression: Appears within functional limits for tasks assessed   Oral / Motor  Motor Speech Overall Motor Speech: Appears within functional limits for tasks assessed   GO                    Zettie Cooley., SLP Student 12/18/2020, 10:08 AM

## 2020-12-18 NOTE — Progress Notes (Signed)
Occupational Therapy Treatment Patient Details Name: Michelle Graves MRN: [id number] DOB: 11-30-1950 Today's Date: 12/18/2020    History of present illness 70 yo admitted 2/19 with AMS found to have very small left cerebellar infarct. Pt with 2 recent admissions 1/31-2/5, 2/12-2/15 related to UTI and colonic stricture. PMHx: CKD, HTN, CAD, RA, colonic stricture, CVA   OT comments  Pt received in recliner agreeable to OT intervention. Session focus on functional mobility as precursor to higher level BADLs. Pt completed functional mobility greater than a household distance with RW and minguard assist. Pt with come mild cognitive impairments noted but overall pleasant and participatory. Pt would continue to benefit from skilled occupational therapy while admitted and after d/c to address the below listed limitations in order to improve overall functional mobility and facilitate independence with BADL participation. DC plan remains appropriate, will follow acutely per POC.    Follow Up Recommendations  SNF;Supervision/Assistance - 24 hour;Other (comment) (unless 24 hour S/A at home then [facility])    Equipment Recommendations  None recommended by OT    Recommendations for Other Services      Precautions / Restrictions Precautions Precautions: Fall Restrictions Weight Bearing Restrictions: No       Mobility Bed Mobility Overal bed mobility: Needs Assistance Bed Mobility: Sit to Supine       Sit to supine: Min guard   General bed mobility comments: minguard for line mgmt    Transfers Overall transfer level: Needs assistance Equipment used: Rolling walker (2 wheeled) Transfers: Sit to/from Stand Sit to Stand: Min guard         General transfer comment: minguard for safety when rising from recliner    Balance Overall balance assessment: Needs assistance Sitting-balance support: No upper extremity supported;Feet supported Sitting balance-[name]: Good     Standing  balance support: During functional activity;No upper extremity supported Standing balance-[name]: Fair Standing balance comment: pt able to stand with no UE support to don mask with no LOB                           ADL either performed or assessed with clinical judgement   ADL Overall ADL's : Needs assistance/impaired                         Toilet Transfer: Min IT sales professional Details (indicate cue type and reason): simulated via functional mobility with RW         Functional mobility during ADLs: Min guard;Rolling walker General ADL Comments: session focus on functional mobility as precursor to higher level BADLs     Vision       Perception     Praxis      Cognition Arousal/Alertness: Awake/alert Behavior During Therapy: WFL for tasks assessed/performed Overall Cognitive Status: Impaired/Different from baseline Area of Impairment: Memory;Safety/judgement;Orientation;Problem solving                 Orientation Level: Disoriented to;Place (states she is going to [employer], even though already in [place], oriented to day of week and month)   Memory: Decreased short-term memory (repeats same stories)   Safety/Judgement: Decreased awareness of deficits;Decreased awareness of safety   Problem Solving: Slow processing General Comments: pt with some mild cog deficits mostly in the areas memory as pt repeats same questions and states same stories during session        Exercises     Shoulder Instructions  General Comments HR 92 bpm post mobility    Pertinent Vitals/ Pain       Pain Assessment: No/denies pain  Home Living                                          Prior Functioning/Environment              Frequency  Min 2X/week        Progress Toward Goals  OT Goals(current goals can now be found in the care plan section)  Progress towards OT goals: Progressing toward  goals  Acute Rehab OT Goals Patient Stated Goal: going to rehab OT Goal Formulation: With patient Time For Goal Achievement: 12/30/20 Potential to Achieve Goals: Good  Plan Discharge plan remains appropriate;Frequency remains appropriate    Co-evaluation                 AM-PAC OT "6 Clicks" Daily Activity     Outcome Measure   Help from another person eating meals?: A Little Help from another person taking care of personal grooming?: A Little Help from another person toileting, which includes using toliet, bedpan, or urinal?: A Little Help from another person bathing (including washing, rinsing, drying)?: A Little Help from another person to put on and taking off regular upper body clothing?: None Help from another person to put on and taking off regular lower body clothing?: A Little 6 Click Score: 19    End of Session Equipment Utilized During Treatment: Rolling walker  OT Visit Diagnosis: Unsteadiness on feet (R26.81);Other abnormalities of gait and mobility (R26.89);Muscle weakness (generalized) (M62.81);Other symptoms and signs involving cognitive function   Activity Tolerance Patient tolerated treatment well   Patient Left in bed;with call bell/phone within reach;with bed alarm set   Nurse Communication Mobility status;Other (comment) (needs new purewick)        Time: 4196-2229 OT Time Calculation (min): 11 min  Charges: OT General Charges $OT Visit: 1 Visit OT Treatments $Therapeutic Activity: 8-22 mins  Lenor Derrick., COTA/L Acute Rehabilitation Services [phone]    Barron Schmid 12/18/2020, 4:09 PM

## 2020-12-18 NOTE — TOC Initial Note (Addendum)
Transition of Care [facility]) - Initial/Assessment Note    Patient Details  Name: Michelle Graves MRN: [id number] Date of Birth: 09-17-1951  Transition of Care [facility]) CM/SW Contact:    Lorri Frederick, LCSW Phone Number: 12/18/2020, 9:49 AM  Clinical Narrative:    CSW spoke with pt regarding discharge plan.  Pt agreeable to plan for SNF, permission given to send out referral in hub and to speak with daughter [name] and son Fayrene Fearing. Choice document given. Pt is vaccinated for covid and boosted.  Current equipment in the home: walker, rollator, cane, shower chair.    CSW spoke with pt daughter [name], who is also in agreement with this plan.  [name] reports that [facility] was just starting to provide [facility] services.  [name] also asked about assistance with healthcare POA--CSW agreed to put info in pt room.  Daughter is going to bring this up with pt this evening and may be interested with help in filling it out.    1030: level 2 passr docs uploaded.  Auth request faxed to [facility].    1315: Bed offers from [facility], [facility], and [facility].  CSW spoke with pt daughter [name] and also with pt and they want to accept offer from [facility]. CSW spoke with Lowella Bandy at [facility] and confirmed.    1500: Messages from [employer] asking about H and P which states pt drinks 21 alcohol drinks per week.  Admitting MD not available for clarification.  CSW spoke with pt and pt daughter who both report this is in error.  Pt reports she drinks a glass of wine 1-2 nights per week.  Daughter confirms this and reports pt mainly drinks on special occasions.  Dr Jerral Ralph wrote a note documenting this which was requested by [employer].   1600: [employer] received:   [id number]      Expected Discharge Plan: Skilled Nursing Facility Barriers to Discharge: SNF Pending bed offer   Patient Goals and CMS Choice Patient states their goals for this hospitalization and ongoing recovery are:: "get back to getting out and visiting people" CMS Medicare.gov  Compare Post Acute Care list provided to:: Patient Choice offered to / list presented to : Patient  Expected Discharge Plan and Services Expected Discharge Plan: Skilled Nursing Facility     Post Acute Care Choice: Skilled Nursing Facility Living arrangements for the past 2 months: Apartment                                      Prior Living Arrangements/Services Living arrangements for the past 2 months: Apartment Lives with:: Self Patient language and need for interpreter reviewed:: Yes Do you feel safe going back to the place where you live?: Yes      Need for Family Participation in Patient Care: Yes (Comment) Care giver support system in place?: Yes (comment) Current home services: Home OT,Home PT [facility]) Criminal Activity/Legal Involvement Pertinent to Current Situation/Hospitalization: No - Comment as needed  Activities of Daily Living      Permission Sought/Granted Permission sought to share information with : Family Electrical engineer Permission granted to share information with : Yes, Verbal Permission Granted  Share Information with NAME: daughter [name], son Fayrene Fearing  Permission granted to share info w AGENCY: SNF        Emotional Assessment Appearance:: Appears stated age Attitude/Demeanor/Rapport: Engaged Affect (typically observed): Appropriate,Pleasant Orientation: : Oriented to Self,Oriented to Place,Oriented to  Time,Oriented to Situation Alcohol / Substance Use: Not Applicable Psych Involvement: No (comment)  Admission diagnosis:  TIA (transient ischemic attack) [G45.9] Confusion [R41.0] Encephalopathy [G93.40] Patient Active Problem List   Diagnosis Date Noted  . Encephalopathy 12/17/2020  . TIA (transient ischemic attack) 12/15/2020  . 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 Bowel obstruction (HCC) 05/06/2020  . SIRS (systemic inflammatory response syndrome) (HCC) 05/06/2020  . UTI (urinary tract infection) 11/08/2019   . Major depressive disorder, recurrent episode, moderate (HCC) 11/08/2019  . Urinary retention 10/28/2019  . Acute on chronic kidney failure (HCC) 10/27/2019  . Malnutrition of moderate degree 10/03/2019  . Colitis 09/30/2019  . AKI (acute kidney injury) (HCC) 05/28/2019  . Nausea & vomiting 05/28/2019  . 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 Anemia, chronic disease 05/28/2019  . Swelling   . Small vessel disease, cerebrovascular   . Acute ischemic stroke (HCC)   . Weakness   . Acute left ankle pain   . Acute on chronic renal failure (HCC) 05/13/2019  . Weakness of both lower extremities 05/13/2019  . Cerebral thrombosis with cerebral infarction 03/31/2019  . New onset a-fib (HCC) 03/27/2019  . Acute kidney injury superimposed on CKD (HCC) 03/24/2019  . Hypotension 03/24/2019  . Hematemesis 03/24/2019  . Dark stools 03/24/2019  . Diverticulitis of intestine without perforation or abscess without bleeding   . Sinus tachycardia 08/27/2016  . Diverticulitis 08/26/2016  . HYPERTENSION, MALIGNANT ESSENTIAL 06/24/2007  . KIDNEY DISEASE, CHRONIC, STAGE III 06/24/2007  . Rheumatoid arthritis (HCC) 06/24/2007   PCP:  [facility]:   [id number] - [name], [place] - [address] AT [facility] OF [address] [place] [address] Phone: [phone] Fax: [phone]     Social Determinants of Health (SDOH) Interventions    Readmission Risk Interventions Readmission Risk Prevention Plan 05/10/2020 05/10/2020 10/28/2019  Transportation Screening - Complete Complete  PCP or Specialist Appt within 3-5 Days - - -  Not Complete comments - - -  HRI or Home Care Consult - - -  HRI or Home Care Consult comments - - -  Social Work Consult for Recovery Care Planning/Counseling - - -  Palliative Care Screening - - -  Medication Review Oceanographer) - Referral to Pharmacy Referral to Pharmacy  PCP  or Specialist appointment within 3-5 days of discharge Complete Complete Not Complete  PCP/Specialist Appt Not Complete comments - - due to holiday, pt to make appointment  HRI or Home Care Consult - Complete Complete  SW Recovery Care/Counseling Consult - - Complete  Palliative Care Screening - Not Applicable Not Applicable  Skilled Nursing Facility - Patient Refused Complete  Some recent data might be hidden

## 2020-12-18 NOTE — Progress Notes (Addendum)
PROGRESS NOTE    Michelle Graves  AVW:[id number] DOB: 12-Sep-1951 DOA: 12/15/2020 PCP: [facility]    Brief Narrative:  70 year old female with history of hypertension, chronic kidney disease stage IIIb with baseline creatinine about 1.4, history of TIA and stroke 2020, rheumatoid arthritis on Plaquenil and recent multiple hospitalization presented back to hospital 1 more time with altered mental status.  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 1/31-2/5, admitted for urinary retention, acute kidney injury and colonic stricture, colonoscopy and medical management. 2/12-2/15, recurrent acute kidney injury and urinary retention creatinine as high as 5.8 and discharged home with Foley catheter with home health PT. 2/17, Foley catheter came out at home, presented to ER and found to have normalized renal function and urinating well so discharged. 2/19, brought back by her daughter stating that patient is more confused than usual.  Patient herself is poor historian and not sure what is going on with her. In the emergency room, blood pressures 200, CT head normal.  Creatinine 1.58 which is about baseline.  No urinary retention.  CT abdomen pelvis with colonic stricture that is known with large amount of stool along the transverse colon in the right hemicolon.  No evidence of active infection.  Subsequent MRI showed very small subacute left cerebellar stroke. Admitted due to altered mental status and that is improving now.   Assessment & Plan:   Active Problems:   Rheumatoid arthritis (HCC)   Acute ischemic stroke (HCC)   Colonic stricture (HCC)   TIA (transient ischemic attack)   Encephalopathy  Altered mental status/acute metabolic encephalopathy: Suspect multifactorial with recent multiple medical illnesses and hospitalizations. MRI brain with subacute cerebellar stroke, too small to cause any somatic  symptoms. Blood sugars 54 on arrival, patient is not diabetic.  Suspect patient may suffer from episodic hypoglycemia. Polypharmacy suspected, delirium due to acute illness is suspected. Patient is stabilizing.  We will continue to work on causes. Patient has no evidence of active infection. Urinary retention has improved. Mental status has improved.    Hypoglycemia: Presented with blood sugars 54.  She is not diabetic.  A1c 6.3.  Not on any hypoglycemic agents.  She is on chronic hydroxychloroquine and that can cause hypoglycemia. Morning cortisol is less than 4.   Morning blood sugar 71 despite being on regular diet. ACTH stimulation test with Cortisol 6.3 30 minutes post ACTH 16.6 60 minutes post ACTH 19.7 Patient had similar studies last year.  TSH is normal.  Unlikely panhypopituitarism. Hydroxychloroquine may be causing hypoglycemia.  We will let her rheumatologist address this. Addendum:  Found out that patient is on 5 mg prednisone for her rheumatoid arthritis.  She has been mostly taking it with some missing doses. With the use of external steroids, ACTH test is probably nonconclusive.  We will still send referral to endocrinology for evaluation on discharge.  Acute/subacute cerebellar stroke: Clinical findings, metabolic encephalopathy, however likely not contributed by recent stroke. CT head findings, normal. MRI of the brain, very small area of left lateral cerebellum infarction. Carotid Doppler, pending. 2D echocardiogram, pending. Antiplatelet therapy, aspirin. LDL 90.  Goal is less than 70.  We will start patient on statin.  Hemoglobin A1c 6.3.  No indication for treatment. DVT prophylaxis, Lovenox. Therapy recommendations, skilled nursing facility. We will start aggressive blood pressure treatment. Discussed with neurology, recommended sending for outpatient follow-up. Ambulatory referral made.  Accelerated hypertension: Blood pressures elevated on presentation.  She is on multiple blood pressure medications.  No indication for permissive hypertension. Resume all home medications, will continue to uptitrate medications.   Currently controlled.  Abdominal pain/chronic constipation/benign colonic stricture: Currently without evidence of bowel obstruction.  Start patient on aggressive laxative. Successful after enema.  Continue.  Hypomagnesemia: Replace aggressively and monitor levels.  Chronic kidney disease stage IIIb: Improved to baseline.  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  Will start on prednisone.  Abnormal urine cultures: Urine culture growing staph epidermidis.  Asymptomatic.  Will not treat.  Patient has received multiple antibiotics recently.  Profound physical deconditioning: Will benefit with PT OT and inpatient therapies at the skilled nursing facility.   DVT prophylaxis: enoxaparin (LOVENOX) injection 40 mg Start: 12/15/20 2200   Code Status: Full code Family Communication: Patient's daughter on the phone 2/21 Disposition Plan: Status is: Inpatient  The patient will require care spanning > 2 midnights and should be moved to inpatient because: Altered mental status and Inpatient level of care appropriate due to severity of illness  Dispo: The patient is from: Home              Anticipated d/c is to: SNF               Anticipated d/c date is: 1 day              Patient currently is medically stable to transfer to skilled level of care.   Difficult to place patient No     Consultants:   Neurology, curbside consult  Procedures:   None  Antimicrobials:   None   Subjective: Patient seen and examined.  No overnight events.  Has difficulty walking around.  Denies any nausea vomiting.  Had regular bowel movements. Blood sugar 70 1 in the morning.  Asymptomatic.  Objective: Vitals:   12/17/20 0830 12/17/20 1203 12/17/20 1932 12/18/20 0921  BP: (!) 148/104 (!) 147/95 (!) 149/101 124/87  Pulse: 64 80 84  83  Resp: 18 16  19   Temp: 98.4 F (36.9 C) 98.6 F (37 C) 98.2 F (36.8 C) 98.7 F (37.1 C)  TempSrc: Oral     SpO2: 98% 90% 100% 99%  Weight:      Height:        Intake/Output Summary (Last 24 hours) at 12/18/2020 1049 Last data filed at 12/18/2020 0559 Gross per 24 hour  Intake --  Output 450 ml  Net -450 ml   Filed Weights   12/15/20 2137  Weight: 78.5 kg    Examination:  General exam: Appears calm and comfortable  Alert awake and oriented x 3-4.  No more confusion.  She does have some cognitive dysfunction. Respiratory system: Clear to auscultation. Respiratory effort normal.  No added sounds. Cardiovascular system: S1 & S2 heard, RRR. No JVD, murmurs, rubs, gallops or clicks. No pedal edema. Gastrointestinal system: Abdomen is nondistended, soft and nontender. No organomegaly or masses felt. Normal bowel sounds heard. Central nervous system: No focal neurological deficits.  Speech is fluent. Extremities: Symmetric 5 x 5 power. Skin: No rashes, lesions or ulcers Psychiatry: Judgement and insight appear compromised.    Data Reviewed: I have personally reviewed following labs and imaging studies  CBC: Recent Labs  Lab 12/15/20 1150 12/15/20 2334 12/17/20 0253  WBC 9.5 8.3 7.0  NEUTROABS  --   --  3.5  HGB 11.2* 10.9* 10.2*  HCT 35.6* 32.6* 30.2*  MCV 97.3 93.4 94.7  PLT 215 209 181   Basic Metabolic Panel: Recent  Labs  Lab 12/15/20 1150 12/15/20 2334 12/17/20 0253 12/18/20 0245  NA 144  --  139 138  K 3.8  --  3.6 3.9  CL 113*  --  108 105  CO2 19*  --  23 24  GLUCOSE 75  --  108* 95  BUN 14  --  10 11  CREATININE 1.58* 1.51* 1.57* 1.59*  CALCIUM 9.2  --  8.5* 8.9  MG  --   --  1.4* 1.9  PHOS  --   --  3.3  --    GFR: Estimated Creatinine Clearance: 36.6 mL/min (A) (by C-G formula based on SCr of 1.59 mg/dL (H)). Liver Function Tests: Recent Labs  Lab 12/15/20 1150 12/17/20 0253 12/18/20 0245  AST 17 14* 14*  ALT ALKPHOS 63  52 64  BILITOT 0.6 0.6 0.6  PROT 6.6 5.6* 5.7*  ALBUMIN 3.5 2.8* 2.9*   No results for input(s): LIPASE, AMYLASE in the last 168 hours. No results for input(s): AMMONIA in the last 168 hours. Coagulation Profile: No results for input(s): INR, PROTIME in the last 168 hours. Cardiac Enzymes: No results for input(s): CKTOTAL, CKMB, CKMBINDEX, TROPONINI in the last 168 hours. BNP (last 3 results) No results for input(s): PROBNP in the last 8760 hours. HbA1C: Recent Labs    12/16/20 0223  HGBA1C 6.3*   CBG: Recent Labs  Lab 12/17/20 0749 12/17/20 1155 12/17/20 1552 12/17/20 2026 12/18/20 0711  GLUCAP 98 106* 99 95 73   Lipid Profile: Recent Labs    12/16/20 0223  CHOL 179  HDL 75  LDLCALC 93  TRIG 55  CHOLHDL 2.4   Thyroid Function Tests: No results for input(s): TSH, T4TOTAL, FREET4, T3FREE, THYROIDAB in the last 72 hours. Anemia Panel: Recent Labs    12/17/20 0253  VITAMINB12 1,340*  FOLATE 23.6   Sepsis Labs: No results for input(s): PROCALCITON, LATICACIDVEN in the last 168 hours.  Recent Results (from the past 240 hour(s))  SARS CORONAVIRUS 2 (TAT 6-24 HRS) Nasopharyngeal Nasopharyngeal Swab     Status: None   Collection Time: 12/08/20  4:03 PM   Specimen: Nasopharyngeal Swab  Result Value Ref Range Status   SARS Coronavirus 2 NEGATIVE NEGATIVE Final    Comment: (NOTE) SARS-CoV-2 target nucleic acids are NOT DETECTED.  The SARS-CoV-2 RNA is generally detectable in upper and lower respiratory specimens during the acute phase of infection. Negative results do not preclude SARS-CoV-2 infection, do not rule out co-infections with other pathogens, and should not be used as the sole basis for treatment or other patient management decisions. Negative results must be combined with clinical observations, patient history, and epidemiological information. The expected result is Negative.  Fact Sheet for  Patients: HairSlick.no  Fact Sheet for Healthcare Providers: quierodirigir.com  This test is not yet approved or cleared by the [place] FDA and  has been authorized for detection and/or diagnosis of SARS-CoV-2 by FDA under an Emergency Use Authorization (EUA). This EUA will remain  in effect (meaning this test can be used) for the duration of the COVID-19 declaration under Se ction 564(b)(1) of the Act, 21 U.S.C. section 360bbb-3(b)(1), unless the authorization is terminated or revoked sooner.  Performed at [facility], [address] [place], [place] [address]   Urine Culture     Status: None   Collection Time: 12/08/20  6:37 PM   Specimen: Urine, Catheterized  Result Value Ref Range Status   Specimen Description URINE, CATHETERIZED  Final   Special Requests NONE  Final   Culture   Final    NO GROWTH Performed at [facility], [address] [place], [place] [address]    Report Status 12/09/2020 FINAL  Final  Urine culture     Status: Abnormal   Collection Time: 12/15/20  3:24 PM   Specimen: Urine, Random  Result Value Ref Range Status   Specimen Description URINE, RANDOM  Final   Special Requests   Final    NONE Performed at [facility], [address] [place], [place] [address]    Culture >=100,000 COLONIES/mL STAPHYLOCOCCUS EPIDERMIDIS (A)  Final   Report Status 12/17/2020 FINAL  Final   Organism ID, Bacteria STAPHYLOCOCCUS EPIDERMIDIS (A)  Final      Susceptibility   Staphylococcus epidermidis - MIC*    CIPROFLOXACIN 2 INTERMEDIATE Intermediate     GENTAMICIN >=16 RESISTANT Resistant     NITROFURANTOIN <=16 SENSITIVE Sensitive     OXACILLIN >=4 RESISTANT Resistant     TETRACYCLINE <=1 SENSITIVE Sensitive     VANCOMYCIN 1 SENSITIVE Sensitive     TRIMETH/SULFA 20 SENSITIVE Sensitive     CLINDAMYCIN 0.5 SENSITIVE Sensitive     RIFAMPIN <=0.5 SENSITIVE Sensitive     Inducible  Clindamycin NEGATIVE Sensitive     * >=100,000 COLONIES/mL STAPHYLOCOCCUS EPIDERMIDIS  Resp Panel by RT-PCR (Flu A&B, Covid) Nasopharyngeal Swab     Status: None   Collection Time: 12/15/20  7:16 PM   Specimen: Nasopharyngeal Swab; Nasopharyngeal(NP) swabs in vial transport medium  Result Value Ref Range Status   SARS Coronavirus 2 by RT PCR NEGATIVE NEGATIVE Final    Comment: (NOTE) SARS-CoV-2 target nucleic acids are NOT DETECTED.  The SARS-CoV-2 RNA is generally detectable in upper respiratory specimens during the acute phase of infection. The lowest concentration of SARS-CoV-2 viral copies this assay can detect is 138 copies/mL. A negative result does not preclude SARS-Cov-2 infection and should not be used as the sole basis for treatment or other patient management decisions. A negative result may occur with  improper specimen collection/handling, submission of specimen other than nasopharyngeal swab, presence of viral mutation(s) within the areas targeted by this assay, and inadequate number of viral copies(<138 copies/mL). A negative result must be combined with clinical observations, patient history, and epidemiological information. The expected result is Negative.  Fact Sheet for Patients:  BloggerCourse.com  Fact Sheet for Healthcare Providers:  SeriousBroker.it  This test is no t yet approved or cleared by the [place] FDA and  has been authorized for detection and/or diagnosis of SARS-CoV-2 by FDA under an Emergency Use Authorization (EUA). This EUA will remain  in effect (meaning this test can be used) for the duration of the COVID-19 declaration under Section 564(b)(1) of the Act, 21 U.S.C.section 360bbb-3(b)(1), unless the authorization is terminated  or revoked sooner.       Influenza A by PCR NEGATIVE NEGATIVE Final   Influenza B by PCR NEGATIVE NEGATIVE Final    Comment: (NOTE) The Xpert Xpress  SARS-CoV-2/FLU/RSV plus assay is intended as an aid in the diagnosis of influenza from Nasopharyngeal swab specimens and should not be used as a sole basis for treatment. Nasal washings and aspirates are unacceptable for Xpert Xpress SARS-CoV-2/FLU/RSV testing.  Fact Sheet for Patients: BloggerCourse.com  Fact Sheet for Healthcare Providers: SeriousBroker.it  This test is not yet approved or cleared by the [place] FDA and has been authorized for detection and/or diagnosis of SARS-CoV-2 by FDA  under an Emergency Use Authorization (EUA). This EUA will remain in effect (meaning this test can be used) for the duration of the COVID-19 declaration under Section 564(b)(1) of the Act, 21 U.S.C. section 360bbb-3(b)(1), unless the authorization is terminated or revoked.  Performed at [facility], [address] [place], [place] [address]          Radiology Studies: ECHOCARDIOGRAM COMPLETE  Result Date: 12/16/2020    ECHOCARDIOGRAM REPORT   Patient Name:   BONITA BRINDISI Date of Exam: 12/16/2020 Medical Rec #:  [id number]         Height:       67.0 in Accession #:    [id number]        Weight:       173.0 lb Date of Birth:  October 13, 1951          BSA:          1.901 m Patient Age:    70 years          BP:           138/95 mmHg Patient Gender: F                 HR:           75 bpm. Exam Location:  Inpatient Procedure: 2D Echo, Cardiac Doppler and Color Doppler Indications:    TIA 435.9 / G45.9  History:        Patient has prior history of Echocardiogram examinations, most                 recent 05/10/2020. Stroke, Arrythmias:Atrial Fibrillation; Risk                 Factors:Hypertension. Acute on chronic renal failure.  Sonographer:    Celesta Gentile RCS Referring Phys: [id number] Alessandra Bevels IMPRESSIONS  1. Left ventricular ejection fraction, by estimation, is 55 to 60%. The left ventricle has normal function. The left ventricle has no  regional wall motion abnormalities. There is moderate concentric left ventricular hypertrophy. Left ventricular diastolic parameters are consistent with Grade I diastolic dysfunction (impaired relaxation). Elevated left atrial pressure.  2. Right ventricular systolic function is normal. The right ventricular size is normal. There is normal pulmonary artery systolic pressure. The estimated right ventricular systolic pressure is 27.4 mmHg.  3. Left atrial size was mildly dilated.  4. The mitral valve is normal in structure. Mild mitral valve regurgitation. No evidence of mitral stenosis.  5. The aortic valve is normal in structure. Aortic valve regurgitation is mild. Mild to moderate aortic valve sclerosis/calcification is present, without any evidence of aortic stenosis.  6. The inferior vena cava is normal in size with greater than 50% respiratory variability, suggesting right atrial pressure of 3 mmHg. FINDINGS  Left Ventricle: Left ventricular ejection fraction, by estimation, is 55 to 60%. The left ventricle has normal function. The left ventricle has no regional wall motion abnormalities. The left ventricular internal cavity size was normal in size. There is  moderate concentric left ventricular hypertrophy. Left ventricular diastolic parameters are consistent with Grade I diastolic dysfunction (impaired relaxation). Elevated left atrial pressure. Right Ventricle: The right ventricular size is normal. No increase in right ventricular wall thickness. Right ventricular systolic function is normal. There is normal pulmonary artery systolic pressure. The tricuspid regurgitant velocity is 2.47 m/s, and  with an assumed right atrial pressure of 3 mmHg, the estimated right ventricular systolic pressure is 27.4 mmHg. Left Atrium: Left atrial size  was mildly dilated. Right Atrium: Right atrial size was normal in size. Pericardium: There is no evidence of pericardial effusion. Mitral Valve: The mitral valve is normal in  structure. Mild mitral valve regurgitation. No evidence of mitral valve stenosis. Tricuspid Valve: The tricuspid valve is normal in structure. Tricuspid valve regurgitation is not demonstrated. No evidence of tricuspid stenosis. Aortic Valve: The aortic valve is normal in structure. Aortic valve regurgitation is mild. Aortic regurgitation PHT measures 599 msec. Mild to moderate aortic valve sclerosis/calcification is present, without any evidence of aortic stenosis. Pulmonic Valve: The pulmonic valve was normal in structure. Pulmonic valve regurgitation is not visualized. No evidence of pulmonic stenosis. Aorta: The aortic root is normal in size and structure. Venous: The inferior vena cava is normal in size with greater than 50% respiratory variability, suggesting right atrial pressure of 3 mmHg. IAS/Shunts: No atrial level shunt detected by color flow Doppler.  LEFT VENTRICLE PLAX 2D LVIDd:         3.90 cm  Diastology LVIDs:         2.80 cm  LV e' medial:    3.59 cm/s LV PW:         1.80 cm  LV E/e' medial:  14.3 LV IVS:        1.50 cm  LV e' lateral:   4.57 cm/s LVOT diam:     2.00 cm  LV E/e' lateral: 11.2 LV SV:         57 LV SV Index:   30 LVOT Area:     3.14 cm  RIGHT VENTRICLE TAPSE (M-mode): 2.1 cm LEFT ATRIUM           Index       RIGHT ATRIUM           Index LA diam:      2.90 cm 1.53 cm/m  RA Area:     13.60 cm LA Vol (A2C): 56.3 ml 29.61 ml/m RA Volume:   28.80 ml  15.15 ml/m LA Vol (A4C): 85.1 ml 44.76 ml/m  AORTIC VALVE LVOT Vmax:   92.50 cm/s LVOT Vmean:  67.000 cm/s LVOT VTI:    0.183 m AI PHT:      599 msec  AORTA Ao Root diam: 3.60 cm MITRAL VALVE                TRICUSPID VALVE MV Area (PHT): 2.95 cm     TR Peak grad:   24.4 mmHg MV Decel Time: 257 msec     TR Vmax:        247.00 cm/s MV E velocity: 51.40 cm/s MV A velocity: 109.00 cm/s  SHUNTS MV E/A ratio:  0.47         Systemic VTI:  0.18 m                             Systemic Diam: 2.00 cm Tobias Alexander MD Electronically signed by  Tobias Alexander MD Signature Date/Time: 12/16/2020/4:26:09 PM    Final         Scheduled Meds: .  stroke: mapping our early stages of recovery book   Does not apply Once  . aspirin EC  325 mg Oral Daily  . carvedilol  25 mg Oral BID  . cloNIDine  0.1 mg Oral BID  . [START ON 12/19/2020] cosyntropin  0.25 mg Intravenous Once  . docusate sodium  100 mg Oral BID  . DULoxetine  60 mg  Oral QHS  . enoxaparin (LOVENOX) injection  40 mg Subcutaneous Q24H  . hydroxychloroquine  200 mg Oral Daily  . isosorbide mononitrate  30 mg Oral Daily  . lactulose  20 g Oral BID  . mirabegron ER  25 mg Oral Daily  . multivitamin with minerals  1 tablet Oral Daily  . pantoprazole  40 mg Oral Daily  . predniSONE  5 mg Oral Q breakfast  . QUEtiapine  50 mg Oral Daily  . tamsulosin  0.4 mg Oral QPC supper   Continuous Infusions:    LOS: 1 day    Time spent: 32 minutes    Dorcas Carrow, MD [facility] Pager [phone]

## 2020-12-18 NOTE — [place] FL2 (Signed)
[place] MEDICAID [id number] LEVEL OF CARE SCREENING TOOL     IDENTIFICATION  Patient Name: Michelle Graves Birthdate: Dec 11, 1950 Sex: female Admission Date (Current Location): 12/15/2020  [facility] and [place] Number:  Producer, television/film/video and Address:  [facility] [place], [address] [place], [place] [address]      Provider Number: [id number]  Attending Physician Name and Address:  Dorcas Carrow, MD  Relative Name and Phone Number:  [name] Daughter [phone]    Current Level of Care: Hospital Recommended Level of Care: Skilled Nursing Facility Prior Approval Number:    Date Approved/Denied:   PASRR Number:    Discharge Plan: SNF    Current Diagnoses: Patient Active Problem List   Diagnosis Date Noted  . Encephalopathy 12/17/2020  . TIA (transient ischemic attack) 12/15/2020  . 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 Bowel obstruction (HCC) 05/06/2020  . SIRS (systemic inflammatory response syndrome) (HCC) 05/06/2020  . UTI (urinary tract infection) 11/08/2019  . Major depressive disorder, recurrent episode, moderate (HCC) 11/08/2019  . Urinary retention 10/28/2019  . Acute on chronic kidney failure (HCC) 10/27/2019  . Malnutrition of moderate degree 10/03/2019  . Colitis 09/30/2019  . AKI (acute kidney injury) (HCC) 05/28/2019  . Nausea & vomiting 05/28/2019  . 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 Anemia, chronic disease 05/28/2019  . Swelling   . Small vessel disease, cerebrovascular   . Acute ischemic stroke (HCC)   . Weakness   . Acute left ankle pain   . Acute on chronic renal failure (HCC) 05/13/2019  . Weakness of both lower extremities 05/13/2019  . Cerebral thrombosis with cerebral infarction 03/31/2019  . New onset a-fib (HCC) 03/27/2019  . Acute kidney injury superimposed on CKD (HCC) 03/24/2019  . Hypotension 03/24/2019  . Hematemesis 03/24/2019  . Dark stools 03/24/2019  . Diverticulitis of intestine without perforation or  abscess without bleeding   . Sinus tachycardia 08/27/2016  . Diverticulitis 08/26/2016  . HYPERTENSION, MALIGNANT ESSENTIAL 06/24/2007  . KIDNEY DISEASE, CHRONIC, STAGE III 06/24/2007  . Rheumatoid arthritis (HCC) 06/24/2007    Orientation RESPIRATION BLADDER Height & Weight     Self,Time,Situation,Place  Normal Incontinent Weight: 173 lb (78.5 kg) Height:  5\' 7"  (170.2 cm)  BEHAVIORAL SYMPTOMS/MOOD NEUROLOGICAL BOWEL NUTRITION STATUS      Continent Diet (Heart diet.  See discharge summary)  AMBULATORY STATUS COMMUNICATION OF NEEDS Skin   Supervision Verbally Normal                       Personal Care Assistance Level of Assistance  Bathing,Feeding,Dressing Bathing Assistance: Limited assistance Feeding assistance: Independent Dressing Assistance: Maximum assistance     Functional Limitations Info  Sight,Hearing,Speech Sight Info: Adequate Hearing Info: Adequate Speech Info: Adequate    SPECIAL CARE FACTORS FREQUENCY  PT (By licensed PT),OT (By licensed OT)     PT Frequency: 5x week OT Frequency: 5x week            Contractures Contractures Info: Not present    Additional Factors Info  Code Status,Allergies Code Status Info: full Allergies Info: NKA           Current Medications (12/18/2020):  This is the current hospital active medication list Current Facility-Administered Medications  Medication Dose Route Frequency Provider Last Rate Last Admin  .  stroke: mapping our early stages of recovery book   Does not apply Once 12/20/2020, MD      . acetaminophen (TYLENOL) tablet 650 mg  650 mg Oral Q4H PRN Alessandra Bevels, MD   650 mg at 12/16/20 9147   Or  . acetaminophen (TYLENOL) 160 MG/5ML solution 650 mg  650 mg Per Tube Q4H PRN Alessandra Bevels, MD       Or  . acetaminophen (TYLENOL) suppository 650 mg  650 mg Rectal Q4H PRN Alessandra Bevels, MD      . aspirin EC tablet 325 mg  325 mg Oral Daily Alessandra Bevels, MD   325 mg at 12/18/20  0809  . carvedilol (COREG) tablet 25 mg  25 mg Oral BID Alessandra Bevels, MD   25 mg at 12/18/20 0808  . cloNIDine (CATAPRES) tablet 0.1 mg  0.1 mg Oral BID Alessandra Bevels, MD   0.1 mg at 12/18/20 0808  . cloNIDine (CATAPRES) tablet 0.1 mg  0.1 mg Oral TID PRN Alessandra Bevels, MD   0.1 mg at 12/16/20 2318  . [START ON 12/19/2020] cosyntropin (CORTROSYN) injection 0.25 mg  0.25 mg Intravenous Once Kathlen Mody, MD      . docusate sodium (COLACE) capsule 100 mg  100 mg Oral BID Alessandra Bevels, MD   100 mg at 12/18/20 0808  . DULoxetine (CYMBALTA) DR capsule 60 mg  60 mg Oral QHS Alessandra Bevels, MD   60 mg at 12/17/20 2034  . enoxaparin (LOVENOX) injection 40 mg  40 mg Subcutaneous Q24H Alessandra Bevels, MD   40 mg at 12/17/20 2035  . hydrALAZINE (APRESOLINE) injection 10 mg  10 mg Intravenous Q6H PRN Alessandra Bevels, MD      . hydroxychloroquine (PLAQUENIL) tablet 200 mg  200 mg Oral Daily Alessandra Bevels, MD   200 mg at 12/18/20 0808  . isosorbide mononitrate (IMDUR) 24 hr tablet 30 mg  30 mg Oral Daily Alessandra Bevels, MD   30 mg at 12/18/20 0808  . lactulose (CHRONULAC) 10 GM/15ML solution 20 g  20 g Oral BID Dorcas Carrow, MD   20 g at 12/18/20 0807  . mirabegron ER (MYRBETRIQ) tablet 25 mg  25 mg Oral Daily Alessandra Bevels, MD   25 mg at 12/18/20 0809  . multivitamin with minerals tablet 1 tablet  1 tablet Oral Daily Alessandra Bevels, MD   1 tablet at 12/18/20 0808  . pantoprazole (PROTONIX) EC tablet 40 mg  40 mg Oral Daily Alessandra Bevels, MD   40 mg at 12/18/20 0808  . QUEtiapine (SEROQUEL) tablet 50 mg  50 mg Oral Daily Dorcas Carrow, MD   50 mg at 12/18/20 0809  . tamsulosin (FLOMAX) capsule 0.4 mg  0.4 mg Oral QPC supper Alessandra Bevels, MD   0.4 mg at 12/17/20 1717     Discharge Medications: Please see discharge summary for a list of discharge medications.  Relevant Imaging Results:  Relevant Lab Results:   Additional Information SSN:  [id number]  Lorri Frederick, LCSW

## 2020-12-19 DIAGNOSIS — M05749 Rheumatoid arthritis with rheumatoid factor of unspecified hand without organ or systems involvement: Secondary | ICD-10-CM | POA: Diagnosis not present

## 2020-12-19 DIAGNOSIS — I639 Cerebral infarction, unspecified: Secondary | ICD-10-CM | POA: Diagnosis not present

## 2020-12-19 DIAGNOSIS — G9340 Encephalopathy, unspecified: Secondary | ICD-10-CM | POA: Diagnosis not present

## 2020-12-19 LAB — COMPREHENSIVE METABOLIC PANEL
ALT: 12 U/L (ref 0–44)
AST: 14 U/L — ABNORMAL LOW (ref 15–41)
Albumin: 2.8 g/dL — ABNORMAL LOW (ref 3.5–5.0)
Alkaline Phosphatase: 75 U/L (ref 38–126)
Anion gap: 11 (ref 5–15)
BUN: 18 mg/dL (ref 8–23)
CO2: 24 mmol/L (ref 22–32)
Calcium: 8.8 mg/dL — ABNORMAL LOW (ref 8.9–10.3)
Chloride: 104 mmol/L (ref 98–111)
Creatinine, Ser: 1.82 mg/dL — ABNORMAL HIGH (ref 0.44–1.00)
GFR, Estimated: 30 mL/min — ABNORMAL LOW (ref >60)
Glucose, Bld: 101 mg/dL — ABNORMAL HIGH (ref 70–99)
Potassium: 4.5 mmol/L (ref 3.5–5.1)
Sodium: 139 mmol/L (ref 135–145)
Total Bilirubin: 0.4 mg/dL (ref 0.3–1.2)
Total Protein: 5.8 g/dL — ABNORMAL LOW (ref 6.5–8.1)

## 2020-12-19 LAB — GLUCOSE, CAPILLARY: Glucose-Capillary: 102 mg/dL — ABNORMAL HIGH (ref 70–99)

## 2020-12-19 NOTE — Discharge Summary (Signed)
Physician Discharge Summary  BERYL HORNBERGER AVW:[id number] DOB: Mar 04, 1951 DOA: 12/15/2020  PCP: [facility]  Admit date: 12/15/2020 Discharge date: 12/19/2020  Admitted From: Home Disposition: Skilled nursing facility  Recommendations for Outpatient Follow-up:  1. Follow up with PCP in 1-2 weeks 2. Please obtain BMP/CBC/magnesium/phosphorus in one week   Home Health: Not needed Equipment/Devices: Not applicable  Discharge Condition: Stable CODE STATUS: Full code Diet recommendation: Low-salt diet.  Adequate carbohydrate.  Can use additional sugar supplements if blood sugars are low.  Discharge summary:  70 year old female with history of hypertension, chronic kidney disease stage IIIb with baseline creatinine about 1.4, history of TIA and stroke 2020, rheumatoid arthritis on Plaquenil and recent multiple hospitalization presented back to hospital 1 more time with altered mental status.  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 1/31-2/5, admitted for urinary retention, acute kidney injury and colonic stricture, colonoscopy and medical management. 2/12-2/15, recurrent acute kidney injury and urinary retention creatinine as high as 5.8 and discharged home with Foley catheter with home health PT. 2/17, Foley catheter came out at home, presented to ER and found to have normalized renal function and urinating well so discharged. 2/19, brought back by her daughter stating that patient is more confused than usual.  Patient herself is poor historian and not sure what is going on with her. In the emergency room, blood pressures 200, CT head normal.  Creatinine 1.58 which is about baseline.  No urinary retention.  CT abdomen pelvis with colonic stricture that is known with large amount of stool along the transverse colon in the right hemicolon.  No evidence of active infection.  Subsequent MRI showed very small  subacute left cerebellar stroke. Admitted due to altered mental status and that is improving now.   Assessment & Plan of care:    Altered mental status/acute metabolic encephalopathy: Suspect multifactorial with recent multiple medical illnesses and hospitalizations. MRI brain with subacute cerebellar stroke, too small to cause any somatic symptoms. Blood sugars 54 on arrival, patient is not diabetic.  Suspect patient may suffer from episodic hypoglycemia. Polypharmacy suspected, delirium due to acute illness is suspected. Patient has no evidence of active infection. Urinary retention has improved. Mental status has improved and normalized.  Hypoglycemia: Presented with blood sugars 54.  She is not diabetic.  A1c 6.3.  Not on any hypoglycemic agents.  She is on chronic hydroxychloroquine and that can cause hypoglycemia. Morning cortisol is less than 4.   Morning blood sugar 71-100 despite being on regular diet. ACTH stimulation test with Cortisol 6.3 30 minutes post ACTH 16.6 60 minutes post ACTH 19.7 Patient had similar studies last year.  TSH is normal.  Unlikely panhypopituitarism. Hydroxychloroquine may be causing hypoglycemia.  We will let her rheumatologist address this. Patient is on maintenance prednisone therapy that she will continue. Please allow adequate carbohydrate, additional sugar tablets if needed.  Acute/subacute cerebellar stroke: Clinical findings, metabolic encephalopathy, however likely not contributed by recent stroke. CT head findings, normal. MRI of the brain, very small area of left lateral cerebellum infarction. Carotid Doppler, pending. 2D echocardiogram, pending. Antiplatelet therapy, aspirin. LDL 90.  Goal is less than 70.  We will start patient on Lipitor 40 mg daily. Hemoglobin A1c 6.3.  No indication for treatment. DVT prophylaxis, Lovenox. Therapy recommendations, skilled nursing facility. Discussed with neurology, recommended sending for  outpatient follow-up. Ambulatory referral sent.  Accelerated hypertension: Blood pressures elevated on presentation.  She is on  multiple blood pressure medications.  No indication for permissive hypertension. Resume all home medications, will continue to uptitrate medications.   Currently controlled.  Abdominal pain/chronic constipation/benign colonic stricture: Currently without evidence of bowel obstruction.  Start patient on aggressive laxative. We will keep on lactulose.  Hypomagnesemia: Replaced and improved.   Chronic kidney disease stage IIIb: Improved to baseline.  Abnormal urine cultures: Urine culture growing staph epidermidis.  Asymptomatic.  Will not treat.  Patient has received multiple antibiotics recently.  Profound physical deconditioning: Will benefit with PT OT and inpatient therapies at the skilled nursing facility.  Patient is medically stable to transfer to skilled level of care.    Discharge Diagnoses:  Active Problems:   Rheumatoid arthritis (HCC)   Acute ischemic stroke (HCC)   Colonic stricture (HCC)   TIA (transient ischemic attack)   Encephalopathy   Adrenal insufficiency [facility])    Discharge Instructions  Discharge Instructions    Ambulatory referral to Endocrinology   Complete by: As directed    Adrenal insufficiency   Ambulatory referral to Neurology   Complete by: As directed    An appointment is requested in approximately: 4 weeks   Diet - low sodium heart healthy   Complete by: As directed    Diet general   Complete by: As directed    Increase activity slowly   Complete by: As directed    Increase activity slowly   Complete by: As directed      Allergies as of 12/19/2020   No Known Allergies     Medication List    STOP taking these medications   HYDROcodone-acetaminophen 5-325 MG tablet Commonly known as: NORCO/VICODIN   lidocaine 2 % solution Commonly known as: XYLOCAINE     TAKE these medications   aspirin 81 MG  EC tablet Take 81 mg by mouth daily.   atorvastatin 40 MG tablet Commonly known as: LIPITOR Take 1 tablet (40 mg total) by mouth daily.   carvedilol 25 MG tablet Commonly known as: COREG Take 25 mg by mouth 2 (two) times daily.   cloNIDine 0.3 MG tablet Commonly known as: CATAPRES Take 0.3 mg by mouth 2 (two) times daily.   diltiazem 180 MG 24 hr capsule Commonly known as: CARDIZEM CD Take 1 capsule (180 mg total) by mouth daily.   docusate sodium 100 MG capsule Commonly known as: COLACE Take 1 capsule (100 mg total) by mouth 2 (two) times daily.   DULoxetine 60 MG capsule Commonly known as: CYMBALTA Take 60 mg by mouth at bedtime.   hydroxychloroquine 200 MG tablet Commonly known as: PLAQUENIL Take 200 mg by mouth daily.   isosorbide mononitrate 30 MG 24 hr tablet Commonly known as: IMDUR Take 30 mg by mouth daily.   lactulose 10 GM/15ML solution Commonly known as: CHRONULAC Take 30 mLs (20 g total) by mouth 2 (two) times daily.   multivitamin with minerals Tabs tablet Take 1 tablet by mouth daily.   Myrbetriq 25 MG Tb24 tablet Generic drug: mirabegron ER Take 1 tablet (25 mg total) by mouth daily.   pantoprazole 40 MG tablet Commonly known as: PROTONIX Take 1 tablet (40 mg total) by mouth daily.   polyethylene glycol 17 g packet Commonly known as: MIRALAX / GLYCOLAX Take 17 g by mouth daily as needed for mild constipation.   predniSONE 5 MG tablet Commonly known as: DELTASONE Take 5 mg by mouth daily.   QUEtiapine 100 MG tablet Commonly known as: SEROQUEL Take 100 mg by mouth daily.  tamsulosin 0.4 MG Caps capsule Commonly known as: Flomax Take 1 capsule (0.4 mg total) by mouth daily after supper.       Contact information for after-discharge care    Destination    [facility] Preferred SNF .   Service: Skilled Nursing Contact information: [address] [place] [address] [phone]                  No Known Allergies  Consultations:  None  Neurology curbside   Procedures/Studies: CT ABDOMEN PELVIS WO CONTRAST  Result Date: 12/15/2020 CLINICAL DATA:  Bowel obstruction suspected.  Abdominal pain. EXAM: CT ABDOMEN AND PELVIS WITHOUT CONTRAST TECHNIQUE: Multidetector CT imaging of the abdomen and pelvis was performed following the standard protocol without IV contrast. COMPARISON:  CT dated November 26, 2020. FINDINGS: Lower chest: There is chronic scarring at the lung bases. There are trace bilateral pleural effusions.The heart is enlarged. Hepatobiliary: The liver is normal. Normal gallbladder.There is no biliary ductal dilation. Pancreas: Normal contours without ductal dilatation. No peripancreatic fluid collection. Spleen: Unremarkable. Adrenals/Urinary Tract: --Adrenal glands: Unremarkable. --Right kidney/ureter: No hydronephrosis or radiopaque kidney stones. --Left kidney/ureter: No hydronephrosis or radiopaque kidney stones. --Urinary bladder: Unremarkable. Stomach/Bowel: --Stomach/Duodenum: No hiatal hernia or other gastric abnormality. Normal duodenal course and caliber. --Small bowel: Unremarkable. --Colon: Rectosigmoid diverticulosis without acute inflammation. Again noted is significant narrowing at the hepatic flexure, chronic across prior studies. There is a large amount of stool in the transverse colon and right hemicolon. --Appendix: Normal. Vascular/Lymphatic: Atherosclerotic calcification is present within the non-aneurysmal abdominal aorta, without hemodynamically significant stenosis. --No retroperitoneal lymphadenopathy. --No mesenteric lymphadenopathy. --No pelvic or inguinal lymphadenopathy. Reproductive: Unremarkable Other: No ascites or free air. The abdominal wall is normal. Musculoskeletal. A chronic appearing compression fracture is noted of the L5 vertebral body. IMPRESSION: 1. No acute abdominopelvic abnormality. 2. Again noted is significant narrowing at the hepatic  flexure of the colon, chronic across prior studies. 3. Large amount of stool in the transverse colon and right hemicolon. 4. Rectosigmoid diverticulosis without acute inflammation. 5. Trace bilateral pleural effusions with chronic scarring at the lung bases. Aortic Atherosclerosis (ICD10-I70.0). Electronically Signed   By: Katherine Mantle M.D.   On: 12/15/2020 16:31   CT Abdomen Pelvis Wo Contrast  Result Date: 11/26/2020 CLINICAL DATA:  Diarrhea.  Generalized weakness.  Colonic stricture. EXAM: CT ABDOMEN AND PELVIS WITHOUT CONTRAST TECHNIQUE: Multidetector CT imaging of the abdomen and pelvis was performed following the standard protocol without IV contrast. COMPARISON:  Virtual colonoscopy 02/15/2020, single-contrast barium enema 05/08/2020. FINDINGS: Lower chest: Mild scarring at the lung bases. Hepatobiliary: No focal hepatic lesion on noncontrast exam. Gallbladder normal Pancreas: Pancreas is normal. No ductal dilatation. No pancreatic inflammation. Spleen: Normal spleen Adrenals/urinary tract: Adrenal glands normal. Nephrolithiasis ureterolithiasis. Bladder normal Stomach/Bowel: Stomach, duodenum and small bowel normal. Terminal ileum is normal. Appendix not identified. There is fluid stool in the ascending colon. There is several large stool balls in the distal transverse colon leading up the splenic flexure. These have a chronic appearance concentric lamellations (image 34/3). There is a caliber change at the splenic flexure leading to collapsed descending colon. This caliber change present on image 20/3 of axial imaging. A stricturing is well seen on image 150/series 7/sagittal imaging. There several large stool ball again retained proximal to this stricturing of the splenic flexure. Several diverticula descending colon.  Fluid within the rectum. Vascular/Lymphatic: Abdominal aorta is normal caliber with atherosclerotic calcification. There is no retroperitoneal or periportal lymphadenopathy.  No  pelvic lymphadenopathy. Reproductive: Uterus and adnexa unremarkable. Other: No free fluid or free air. Musculoskeletal: No aggressive osseous lesion. IMPRESSION: 1. 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 Three large chronic lamellated stool balls proximal to this stricturing. Differential includes benign and malignant stricturing of the colon. 2. No lymphadenopathy. Electronically Signed   By: Genevive Bi M.D.   On: 11/26/2020 14:52   DG Chest 2 View  Result Date: 11/22/2020 CLINICAL DATA:  Chest pain EXAM: CHEST - 2 VIEW COMPARISON:  September 26, 2020 FINDINGS: The heart size is stable. The aorta remains dilated as before. The lung volumes are low. Bibasilar airspace disease is noted favored to represent atelectasis. There is no pneumothorax. No acute osseous abnormality. IMPRESSION: Low lung volumes with bibasilar atelectasis. Electronically Signed   By: Katherine Mantle M.D.   On: 11/22/2020 02:02   CT Head Wo Contrast  Result Date: 12/15/2020 CLINICAL DATA:  Mental status changes, altered mental status, abdominal pain, recent small-bowel obstruction just released from hospital 4 days ago EXAM: CT HEAD WITHOUT CONTRAST TECHNIQUE: Contiguous axial images were obtained from the base of the skull through the vertex without intravenous contrast. COMPARISON:  06/16/2019 FINDINGS: Brain: Generalized atrophy. Cavum septum pellucidum. Otherwise normal ventricular morphology. No midline shift or mass effect. Small vessel chronic ischemic changes of deep cerebral white matter. No intracranial hemorrhage, mass lesion, evidence of acute infarction, or extra-axial fluid collection. Vascular: Atherosclerotic calcifications of internal carotid arteries at skull base Skull: Intact Sinuses/Orbits: Clear Other: N/A IMPRESSION: Atrophy with small vessel chronic ischemic changes of deep cerebral white matter. No acute intracranial abnormalities. Electronically Signed   By: Ulyses Southward M.D.    On: 12/15/2020 16:32   MR BRAIN WO CONTRAST  Result Date: 12/15/2020 CLINICAL DATA:  Mental status changes of unknown cause. EXAM: MRI HEAD WITHOUT CONTRAST TECHNIQUE: Multiplanar, multiecho pulse sequences of the brain and surrounding structures were obtained without intravenous contrast. COMPARISON:  Head CT same day FINDINGS: Brain: Diffusion imaging shows a tiny acute infarction within the left cerebellum. No other acute finding. Mild chronic small-vessel ischemic changes of the pons. Few old small vessel cerebellar infarctions, including 1 with hemosiderin deposition on the right. Cerebral hemispheres show old infarction in the right basal ganglia and radiating white matter tracts. Old small vessel thalamic infarctions. There is confluent chronic small vessel disease throughout the hemispheric white matter. No large vessel territory infarction. No mass lesion, hydrocephalus or extra-axial collection. Numerous foci of hemosiderin deposition associated with the old small vessel infarctions. Vascular: Major vessels at the base of the brain show flow. 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 Hemosiderin deposition associated with many of the old small vessel infarctions. There appears to be a tiny acute infarction in the left lateral cerebellum. It would be surprising if this were the cause of the clinical presentation, as one might expect this to be subclinical. Electronically Signed   By: Paulina Fusi M.D.   On: 12/15/2020 21:35   US RENAL  Result Date: 12/08/2020 CLINICAL DATA:  Renal failure. EXAM: RENAL / URINARY TRACT ULTRASOUND COMPLETE COMPARISON:  None. FINDINGS: Right Kidney: Renal measurements: 8.5 x 4.5 x 4.7 cm = volume: 92.77 mL. Increased parenchymal echogenicity. Cortical thinning with some lobulation of the contour. No mass. No stone or hydronephrosis.  Left Kidney: Renal measurements: 8.2 x 4.5 x 4.2 cm = volume: 82.1 mL. Increased parenchymal echogenicity.  Renal cortical thinning. Lobulated contour. No mass, stone or hydronephrosis. Bladder: Distended.  Otherwise unremarkable. Other: None. IMPRESSION: 1. Findings consistent with medical renal disease with reduced renal sizes, increased renal parenchymal echogenicity and renal cortical thinning. No masses, stones or hydronephrosis. 2. Distended bladder. Electronically Signed   By: Amie Portland M.D.   On: 12/08/2020 17:07   DG Chest Portable 1 View  Result Date: 12/15/2020 CLINICAL DATA:  Altered mental status, oriented to name only, unable to follow commands, discharge from hospital on Tuesday for small-bowel obstruction, last seen normal yesterday EXAM: PORTABLE CHEST 1 VIEW COMPARISON:  Portable exam 2000 hours compared to 11/22/2020 FINDINGS: Enlargement of cardiac silhouette. Mediastinal contours and pulmonary vascularity normal. Minimal LEFT base atelectasis. Lungs otherwise clear. No acute infiltrate, pleural effusion or pneumothorax. Bones demineralized. IMPRESSION: Enlargement of cardiac silhouette with minimal LEFT basilar atelectasis. Electronically Signed   By: Ulyses Southward M.D.   On: 12/15/2020 20:09   DG Abdomen Acute W/Chest  Result Date: 12/08/2020 CLINICAL DATA:  Constipation. EXAM: DG ABDOMEN ACUTE WITH 1 VIEW CHEST COMPARISON:  CT scan 11/26/2020 FINDINGS: The upright chest x-ray demonstrates normal heart size and mild tortuosity of the thoracic aorta. No acute pulmonary findings. No pleural effusion. No pulmonary lesions. Two views of the abdomen demonstrate the moderate amount of stool in the right and proximal transverse colon. I do not see any significant stool in the left colon, sigmoid colon or rectum. No distended small bowel loops to suggest obstruction. The soft tissue shadows are grossly maintained. No worrisome calcifications. No significant bony findings. IMPRESSION: 1. No  acute cardiopulmonary findings. 2. Moderate stool in the right and proximal transverse colon. Electronically Signed   By: Rudie Meyer M.D.   On: 12/08/2020 16:57   ECHOCARDIOGRAM COMPLETE  Result Date: 12/16/2020    ECHOCARDIOGRAM REPORT   Patient Name:   Michelle Graves Date of Exam: 12/16/2020 Medical Rec #:  [id number]         Height:       67.0 in Accession #:    [id number]        Weight:       173.0 lb Date of Birth:  Dec 03, 1950          BSA:          1.901 m Patient Age:    70 years          BP:           138/95 mmHg Patient Gender: F                 HR:           75 bpm. Exam Location:  Inpatient Procedure: 2D Echo, Cardiac Doppler and Color Doppler Indications:    TIA 435.9 / G45.9  History:        Patient has prior history of Echocardiogram examinations, most                 recent 05/10/2020. Stroke, Arrythmias:Atrial Fibrillation; Risk                 Factors:Hypertension. Acute on chronic renal failure.  Sonographer:    Celesta Gentile RCS Referring Phys: [id number] Alessandra Bevels IMPRESSIONS  1. Left ventricular ejection fraction, by estimation, is 55 to 60%. The left ventricle has normal function. The left ventricle has no regional wall motion abnormalities. There is moderate concentric left ventricular hypertrophy. Left ventricular diastolic parameters are consistent with Grade I diastolic dysfunction (impaired relaxation). Elevated  left atrial pressure.  2. Right ventricular systolic function is normal. The right ventricular size is normal. There is normal pulmonary artery systolic pressure. The estimated right ventricular systolic pressure is 27.4 mmHg.  3. Left atrial size was mildly dilated.  4. The mitral valve is normal in structure. Mild mitral valve regurgitation. No evidence of mitral stenosis.  5. The aortic valve is normal in structure. Aortic valve regurgitation is mild. Mild to moderate aortic valve sclerosis/calcification is present, without any evidence of aortic stenosis.  6. The  inferior vena cava is normal in size with greater than 50% respiratory variability, suggesting right atrial pressure of 3 mmHg. FINDINGS  Left Ventricle: Left ventricular ejection fraction, by estimation, is 55 to 60%. The left ventricle has normal function. The left ventricle has no regional wall motion abnormalities. The left ventricular internal cavity size was normal in size. There is  moderate concentric left ventricular hypertrophy. Left ventricular diastolic parameters are consistent with Grade I diastolic dysfunction (impaired relaxation). Elevated left atrial pressure. Right Ventricle: The right ventricular size is normal. No increase in right ventricular wall thickness. Right ventricular systolic function is normal. There is normal pulmonary artery systolic pressure. The tricuspid regurgitant velocity is 2.47 m/s, and  with an assumed right atrial pressure of 3 mmHg, the estimated right ventricular systolic pressure is 27.4 mmHg. Left Atrium: Left atrial size was mildly dilated. Right Atrium: Right atrial size was normal in size. Pericardium: There is no evidence of pericardial effusion. Mitral Valve: The mitral valve is normal in structure. Mild mitral valve regurgitation. No evidence of mitral valve stenosis. Tricuspid Valve: The tricuspid valve is normal in structure. Tricuspid valve regurgitation is not demonstrated. No evidence of tricuspid stenosis. Aortic Valve: The aortic valve is normal in structure. Aortic valve regurgitation is mild. Aortic regurgitation PHT measures 599 msec. Mild to moderate aortic valve sclerosis/calcification is present, without any evidence of aortic stenosis. Pulmonic Valve: The pulmonic valve was normal in structure. Pulmonic valve regurgitation is not visualized. No evidence of pulmonic stenosis. Aorta: The aortic root is normal in size and structure. Venous: The inferior vena cava is normal in size with greater than 50% respiratory variability, suggesting right atrial  pressure of 3 mmHg. IAS/Shunts: No atrial level shunt detected by color flow Doppler.  LEFT VENTRICLE PLAX 2D LVIDd:         3.90 cm  Diastology LVIDs:         2.80 cm  LV e' medial:    3.59 cm/s LV PW:         1.80 cm  LV E/e' medial:  14.3 LV IVS:        1.50 cm  LV e' lateral:   4.57 cm/s LVOT diam:     2.00 cm  LV E/e' lateral: 11.2 LV SV:         57 LV SV Index:   30 LVOT Area:     3.14 cm  RIGHT VENTRICLE TAPSE (M-mode): 2.1 cm LEFT ATRIUM           Index       RIGHT ATRIUM           Index LA diam:      2.90 cm 1.53 cm/m  RA Area:     13.60 cm LA Vol (A2C): 56.3 ml 29.61 ml/m RA Volume:   28.80 ml  15.15 ml/m LA Vol (A4C): 85.1 ml 44.76 ml/m  AORTIC VALVE LVOT Vmax:   92.50 cm/s LVOT Vmean:  67.000 cm/s  LVOT VTI:    0.183 m AI PHT:      599 msec  AORTA Ao Root diam: 3.60 cm MITRAL VALVE                TRICUSPID VALVE MV Area (PHT): 2.95 cm     TR Peak grad:   24.4 mmHg MV Decel Time: 257 msec     TR Vmax:        247.00 cm/s MV E velocity: 51.40 cm/s MV A velocity: 109.00 cm/s  SHUNTS MV E/A ratio:  0.47         Systemic VTI:  0.18 m                             Systemic Diam: 2.00 cm Tobias Alexander MD Electronically signed by Tobias Alexander MD Signature Date/Time: 12/16/2020/4:26:09 PM    Final     (Echo, Carotid, EGD, Colonoscopy, ERCP)    Subjective: Patient seen and examined.  No overnight events.  She is looking forward to go to [employer]. Denies any nausea vomiting.  Able to have 1 bowel movement today.  Mentation has cleared.   Discharge Exam: Vitals:   12/18/20 2158 12/19/20 0615  BP: (!) 152/87 (!) 160/85  Pulse: 81 70  Resp: 19 19  Temp: 99.2 F (37.3 C) 97.6 F (36.4 C)  SpO2: 100% 100%   Vitals:   12/17/20 1932 12/18/20 0921 12/18/20 2158 12/19/20 0615  BP: (!) 149/101 124/87 (!) 152/87 (!) 160/85  Pulse: 84 83 81 70  Resp:  19 19 19   Temp: 98.2 F (36.8 C) 98.7 F (37.1 C) 99.2 F (37.3 C) 97.6 F (36.4 C)  TempSrc:   Oral   SpO2: 100% 99% 100% 100%  Weight:       Height:        General: Pt is alert, awake, not in acute distress Sitting in couch.  Looks comfortable. She is alert oriented x4.  No neuro deficits. Cardiovascular: RRR, S1/S2 +, no rubs, no gallops Respiratory: CTA bilaterally, no wheezing, no rhonchi Abdominal: Soft, NT, ND, bowel sounds + Extremities: no edema, no cyanosis    The results of significant diagnostics from this hospitalization (including imaging, microbiology, ancillary and laboratory) are listed below for reference.     Microbiology: Recent Results (from the past 240 hour(s))  Urine culture     Status: Abnormal   Collection Time: 12/15/20  3:24 PM   Specimen: Urine, Random  Result Value Ref Range Status   Specimen Description URINE, RANDOM  Final   Special Requests   Final    NONE Performed at [facility], [address], [place] [place], [place] [address]    Culture >=100,000 COLONIES/mL STAPHYLOCOCCUS EPIDERMIDIS (A)  Final   Report Status 12/17/2020 FINAL  Final   Organism ID, Bacteria STAPHYLOCOCCUS EPIDERMIDIS (A)  Final      Susceptibility   Staphylococcus epidermidis - MIC*    CIPROFLOXACIN 2 INTERMEDIATE Intermediate     GENTAMICIN >=16 RESISTANT Resistant     NITROFURANTOIN <=16 SENSITIVE Sensitive     OXACILLIN >=4 RESISTANT Resistant     TETRACYCLINE <=1 SENSITIVE Sensitive     VANCOMYCIN 1 SENSITIVE Sensitive     TRIMETH/SULFA 20 SENSITIVE Sensitive     CLINDAMYCIN 0.5 SENSITIVE Sensitive     RIFAMPIN <=0.5 SENSITIVE Sensitive     Inducible Clindamycin NEGATIVE Sensitive     * >=100,000 COLONIES/mL STAPHYLOCOCCUS EPIDERMIDIS  Resp Panel  by RT-PCR (Flu A&B, Covid) Nasopharyngeal Swab     Status: None   Collection Time: 12/15/20  7:16 PM   Specimen: Nasopharyngeal Swab; Nasopharyngeal(NP) swabs in vial transport medium  Result Value Ref Range Status   SARS Coronavirus 2 by RT PCR NEGATIVE NEGATIVE Final    Comment: (NOTE) SARS-CoV-2 target nucleic acids are NOT DETECTED.  The  SARS-CoV-2 RNA is generally detectable in upper respiratory specimens during the acute phase of infection. The lowest concentration of SARS-CoV-2 viral copies this assay can detect is 138 copies/mL. A negative result does not preclude SARS-Cov-2 infection and should not be used as the sole basis for treatment or other patient management decisions. A negative result may occur with  improper specimen collection/handling, submission of specimen other than nasopharyngeal swab, presence of viral mutation(s) within the areas targeted by this assay, and inadequate number of viral copies(<138 copies/mL). A negative result must be combined with clinical observations, patient history, and epidemiological information. The expected result is Negative.  Fact Sheet for Patients:  BloggerCourse.com  Fact Sheet for Healthcare Providers:  SeriousBroker.it  This test is no t yet approved or cleared by the [place] FDA and  has been authorized for detection and/or diagnosis of SARS-CoV-2 by FDA under an Emergency Use Authorization (EUA). This EUA will remain  in effect (meaning this test can be used) for the duration of the COVID-19 declaration under Section 564(b)(1) of the Act, 21 U.S.C.section 360bbb-3(b)(1), unless the authorization is terminated  or revoked sooner.       Influenza A by PCR NEGATIVE NEGATIVE Final   Influenza B by PCR NEGATIVE NEGATIVE Final    Comment: (NOTE) The Xpert Xpress SARS-CoV-2/FLU/RSV plus assay is intended as an aid in the diagnosis of influenza from Nasopharyngeal swab specimens and should not be used as a sole basis for treatment. Nasal washings and aspirates are unacceptable for Xpert Xpress SARS-CoV-2/FLU/RSV testing.  Fact Sheet for Patients: BloggerCourse.com  Fact Sheet for Healthcare Providers: SeriousBroker.it  This test is not yet approved or  cleared by the [place] FDA and has been authorized for detection and/or diagnosis of SARS-CoV-2 by FDA under an Emergency Use Authorization (EUA). This EUA will remain in effect (meaning this test can be used) for the duration of the COVID-19 declaration under Section 564(b)(1) of the Act, 21 U.S.C. section 360bbb-3(b)(1), unless the authorization is terminated or revoked.  Performed at [facility], [address] [place], [place] [address]   SARS CORONAVIRUS 2 (TAT 6-24 HRS) Nasopharyngeal Nasopharyngeal Swab     Status: None   Collection Time: 12/18/20 11:57 AM   Specimen: Nasopharyngeal Swab  Result Value Ref Range Status   SARS Coronavirus 2 NEGATIVE NEGATIVE Final    Comment: (NOTE) SARS-CoV-2 target nucleic acids are NOT DETECTED.  The SARS-CoV-2 RNA is generally detectable in upper and lower respiratory specimens during the acute phase of infection. Negative results do not preclude SARS-CoV-2 infection, do not rule out co-infections with other pathogens, and should not be used as the sole basis for treatment or other patient management decisions. Negative results must be combined with clinical observations, patient history, and epidemiological information. The expected result is Negative.  Fact Sheet for Patients: HairSlick.no  Fact Sheet for Healthcare Providers: quierodirigir.com  This test is not yet approved or cleared by the [place] FDA and  has been authorized for detection and/or diagnosis of SARS-CoV-2 by FDA under an Emergency Use Authorization (EUA). This EUA will remain  in effect (meaning  this test can be used) for the duration of the COVID-19 declaration under Se ction 564(b)(1) of the Act, 21 U.S.C. section 360bbb-3(b)(1), unless the authorization is terminated or revoked sooner.  Performed at [facility], [address] [place], [place] [address]      Labs: BNP (last  3 results) Recent Labs    05/08/20 0215 05/09/20 0407  BNP 215.7* 173.4*   Basic Metabolic Panel: Recent Labs  Lab 12/15/20 1150 12/15/20 2334 12/17/20 0253 12/18/20 0245 12/19/20 0635  NA 144  --  139 138 139  K 3.8  --  3.6 3.9 4.5  CL 113*  --  108 105 104  CO2 19*  --  GLUCOSE 75  --  108* 95 101*  BUN 14  --  CREATININE 1.58* 1.51* 1.57* 1.59* 1.82*  CALCIUM 9.2  --  8.5* 8.9 8.8*  MG  --   --  1.4* 1.9  --   PHOS  --   --  3.3  --   --    Liver Function Tests: Recent Labs  Lab 12/15/20 1150 12/17/20 0253 12/18/20 0245 12/19/20 0635  AST 17 14* 14* 14*  ALT ALKPHOS 63 52 64 75  BILITOT 0.6 0.6 0.6 0.4  PROT 6.6 5.6* 5.7* 5.8*  ALBUMIN 3.5 2.8* 2.9* 2.8*   No results for input(s): LIPASE, AMYLASE in the last 168 hours. No results for input(s): AMMONIA in the last 168 hours. CBC: Recent Labs  Lab 12/15/20 1150 12/15/20 2334 12/17/20 0253  WBC 9.5 8.3 7.0  NEUTROABS  --   --  3.5  HGB 11.2* 10.9* 10.2*  HCT 35.6* 32.6* 30.2*  MCV 97.3 93.4 94.7  PLT 215 209 181   Cardiac Enzymes: No results for input(s): CKTOTAL, CKMB, CKMBINDEX, TROPONINI in the last 168 hours. BNP: Invalid input(s): POCBNP CBG: Recent Labs  Lab 12/18/20 0711 12/18/20 1136 12/18/20 1643 12/18/20 2013 12/19/20 0741  GLUCAP 73 140* 92 124* 102*   D-Dimer No results for input(s): DDIMER in the last 72 hours. Hgb A1c No results for input(s): HGBA1C in the last 72 hours. Lipid Profile No results for input(s): CHOL, HDL, LDLCALC, TRIG, CHOLHDL, LDLDIRECT in the last 72 hours. Thyroid function studies No results for input(s): TSH, T4TOTAL, T3FREE, THYROIDAB in the last 72 hours.  Invalid input(s): FREET3 Anemia work up Recent Labs    12/17/20 0253  VITAMINB12 1,340*  FOLATE 23.6   Urinalysis    Component Value Date/Time   COLORURINE YELLOW 12/15/2020 1524   APPEARANCEUR CLEAR 12/15/2020 1524   LABSPEC 1.012 12/15/2020 1524    PHURINE 5.0 12/15/2020 1524   GLUCOSEU NEGATIVE 12/15/2020 1524   HGBUR NEGATIVE 12/15/2020 1524   BILIRUBINUR NEGATIVE 12/15/2020 1524   KETONESUR 5 (A) 12/15/2020 1524   PROTEINUR NEGATIVE 12/15/2020 1524   UROBILINOGEN 1.0 06/24/2010 1850   NITRITE NEGATIVE 12/15/2020 1524   LEUKOCYTESUR NEGATIVE 12/15/2020 1524   Sepsis Labs Invalid input(s): PROCALCITONIN,  WBC,  LACTICIDVEN Microbiology Recent Results (from the past 240 hour(s))  Urine culture     Status: Abnormal   Collection Time: 12/15/20  3:24 PM   Specimen: Urine, Random  Result Value Ref Range Status   Specimen Description URINE, RANDOM  Final   Special Requests   Final    NONE Performed at [facility], [address], [place], [place] [address]    Culture >=100,000 COLONIES/mL STAPHYLOCOCCUS EPIDERMIDIS (A)  Final   Report Status 12/17/2020 FINAL  Final   Organism ID, Bacteria STAPHYLOCOCCUS EPIDERMIDIS (A)  Final      Susceptibility   Staphylococcus epidermidis - MIC*    CIPROFLOXACIN 2 INTERMEDIATE Intermediate     GENTAMICIN >=16 RESISTANT Resistant     NITROFURANTOIN <=16 SENSITIVE Sensitive     OXACILLIN >=4 RESISTANT Resistant     TETRACYCLINE <=1 SENSITIVE Sensitive     VANCOMYCIN 1 SENSITIVE Sensitive     TRIMETH/SULFA 20 SENSITIVE Sensitive     CLINDAMYCIN 0.5 SENSITIVE Sensitive     RIFAMPIN <=0.5 SENSITIVE Sensitive     Inducible Clindamycin NEGATIVE Sensitive     * >=100,000 COLONIES/mL STAPHYLOCOCCUS EPIDERMIDIS  Resp Panel by RT-PCR (Flu A&B, Covid) Nasopharyngeal Swab     Status: None   Collection Time: 12/15/20  7:16 PM   Specimen: Nasopharyngeal Swab; Nasopharyngeal(NP) swabs in vial transport medium  Result Value Ref Range Status   SARS Coronavirus 2 by RT PCR NEGATIVE NEGATIVE Final    Comment: (NOTE) SARS-CoV-2 target nucleic acids are NOT DETECTED.  The SARS-CoV-2 RNA is generally detectable in upper respiratory specimens during the acute phase of infection. The  lowest concentration of SARS-CoV-2 viral copies this assay can detect is 138 copies/mL. A negative result does not preclude SARS-Cov-2 infection and should not be used as the sole basis for treatment or other patient management decisions. A negative result may occur with  improper specimen collection/handling, submission of specimen other than nasopharyngeal swab, presence of viral mutation(s) within the areas targeted by this assay, and inadequate number of viral copies(<138 copies/mL). A negative result must be combined with clinical observations, patient history, and epidemiological information. The expected result is Negative.  Fact Sheet for Patients:  BloggerCourse.com  Fact Sheet for Healthcare Providers:  SeriousBroker.it  This test is no t yet approved or cleared by the [place] FDA and  has been authorized for detection and/or diagnosis of SARS-CoV-2 by FDA under an Emergency Use Authorization (EUA). This EUA will remain  in effect (meaning this test can be used) for the duration of the COVID-19 declaration under Section 564(b)(1) of the Act, 21 U.S.C.section 360bbb-3(b)(1), unless the authorization is terminated  or revoked sooner.       Influenza A by PCR NEGATIVE NEGATIVE Final   Influenza B by PCR NEGATIVE NEGATIVE Final    Comment: (NOTE) The Xpert Xpress SARS-CoV-2/FLU/RSV plus assay is intended as an aid in the diagnosis of influenza from Nasopharyngeal swab specimens and should not be used as a sole basis for treatment. Nasal washings and aspirates are unacceptable for Xpert Xpress SARS-CoV-2/FLU/RSV testing.  Fact Sheet for Patients: BloggerCourse.com  Fact Sheet for Healthcare Providers: SeriousBroker.it  This test is not yet approved or cleared by the [place] FDA and has been authorized for detection and/or diagnosis of SARS-CoV-2 by FDA under  an Emergency Use Authorization (EUA). This EUA will remain in effect (meaning this test can be used) for the duration of the COVID-19 declaration under Section 564(b)(1) of the Act, 21 U.S.C. section 360bbb-3(b)(1), unless the authorization is terminated or revoked.  Performed at [facility], [address], [place], [place] [address]   SARS CORONAVIRUS 2 (TAT 6-24 HRS) Nasopharyngeal Nasopharyngeal Swab     Status: None   Collection Time: 12/18/20 11:57 AM   Specimen: Nasopharyngeal Swab  Result Value Ref Range Status   SARS Coronavirus 2 NEGATIVE NEGATIVE Final    Comment: (NOTE) SARS-CoV-2 target nucleic acids are NOT DETECTED.  The SARS-CoV-2 RNA is generally detectable in upper and lower respiratory specimens during the acute phase of infection. Negative results do not preclude SARS-CoV-2 infection, do not rule out co-infections with other pathogens, and should not be used as the sole basis for treatment or other patient management decisions. Negative results must be combined with clinical observations, patient history, and epidemiological information. The expected result is Negative.  Fact Sheet for Patients: HairSlick.no  Fact Sheet for Healthcare Providers: quierodirigir.com  This test is not yet approved or cleared by the [place] FDA and  has been authorized for detection and/or diagnosis of SARS-CoV-2 by FDA under an Emergency Use Authorization (EUA). This EUA will remain  in effect (meaning this test can be used) for the duration of the COVID-19 declaration under Se ction 564(b)(1) of the Act, 21 U.S.C. section 360bbb-3(b)(1), unless the authorization is terminated or revoked sooner.  Performed at [facility], [address], [place], [place] [address]      Time coordinating discharge:  35 minutes  SIGNED:   Dorcas Carrow, MD  [facility] 12/19/2020, 9:57 AM

## 2020-12-19 NOTE — TOC Transition Note (Signed)
Transition of Care [facility]) - CM/SW Discharge Note   Patient Details  Name: Michelle Graves MRN: [id number] Date of Birth: [birth year]  Transition of Care [facility] CM/SW Contact:  Lorri Frederick, LCSW Phone Number: 12/19/2020, 10:21 AM   Clinical Narrative:   Pt discharging to [facility].  RN call report to [phone].     Final next level of care: Skilled Nursing Facility Barriers to Discharge: Barriers Resolved   Patient Goals and CMS Choice Patient states their goals for this hospitalization and ongoing recovery are:: "get back to getting out and visiting people" CMS Medicare.gov Compare Post Acute Care list provided to:: Patient Choice offered to / list presented to : Patient  Discharge Placement              Patient chooses bed at: [facility] Patient to be transferred to facility by: [facility] Name of family member notified: daughter [name] Patient and family notified of of transfer: 12/19/20  Discharge Plan and Services     Post Acute Care Choice: Skilled Nursing Facility                               Social Determinants of Health (SDOH) Interventions     Readmission Risk Interventions Readmission Risk Prevention Plan 05/10/2020 05/10/2020 10/28/2019  Transportation Screening - Complete Complete  PCP or Specialist Appt within 3-5 Days - - -  Not Complete comments - - -  HRI or Home Care Consult - - -  HRI or Home Care Consult comments - - -  Social Work Consult for Recovery Care Planning/Counseling - - -  Palliative Care Screening - - -  Medication Review [employer]) - Referral to Pharmacy Referral to Pharmacy  PCP or Specialist appointment within 3-5 days of discharge Complete Complete Not Complete  PCP/Specialist Appt Not Complete comments - - due to holiday, pt to make appointment  HRI or Home Care Consult - Complete Complete  SW Recovery Care/Counseling Consult - - Complete  Palliative Care Screening - Not Applicable Not  Applicable  Skilled Nursing Facility - Patient Refused Complete  Some recent data might be hidden

## 2020-12-19 NOTE — Progress Notes (Signed)
Report called to [facility]. AVS placed in packet. Iv's removed from patient. PETAR at bedside to collect patient.

## 2020-12-20 ENCOUNTER — Non-Acute Institutional Stay (SKILLED_NURSING_FACILITY): Payer: Medicare Other | Admitting: Orthopedic Surgery

## 2020-12-20 ENCOUNTER — Encounter: Payer: Self-pay | Admitting: Orthopedic Surgery

## 2020-12-20 DIAGNOSIS — E162 Hypoglycemia, unspecified: Secondary | ICD-10-CM | POA: Diagnosis not present

## 2020-12-20 DIAGNOSIS — I151 Hypertension secondary to other renal disorders: Secondary | ICD-10-CM

## 2020-12-20 DIAGNOSIS — N2889 Other specified disorders of kidney and ureter: Secondary | ICD-10-CM

## 2020-12-20 DIAGNOSIS — I639 Cerebral infarction, unspecified: Secondary | ICD-10-CM | POA: Diagnosis not present

## 2020-12-20 DIAGNOSIS — R413 Other amnesia: Secondary | ICD-10-CM

## 2020-12-20 DIAGNOSIS — I679 Cerebrovascular disease, unspecified: Secondary | ICD-10-CM

## 2020-12-20 DIAGNOSIS — R531 Weakness: Secondary | ICD-10-CM

## 2020-12-20 DIAGNOSIS — G9341 Metabolic encephalopathy: Secondary | ICD-10-CM | POA: Diagnosis not present

## 2020-12-20 DIAGNOSIS — K56699 Other intestinal obstruction unspecified as to partial versus complete obstruction: Secondary | ICD-10-CM

## 2020-12-20 DIAGNOSIS — N1832 Chronic kidney disease, stage 3b: Secondary | ICD-10-CM

## 2020-12-20 NOTE — Progress Notes (Signed)
Location:   [facility] Room Number: 102P Place of Service:  SNF [phone]) Provider:  Hazle Nordmann, NP  Associates, [facility]  Patient Care Team: [facility], [facility] as PCP - General (Family Medicine)  Extended Emergency Contact Information Primary Emergency Contact: McRae,Toni Address: [address]          [name] [address] [place] Home Phone: [phone] Relation: Daughter  Code Status:  Full Code Goals of care: Advanced Directive information Advanced Directives 12/08/2020  Does Patient Have a Medical Advance Directive? No  Would patient like information on creating a medical advance directive? No - Patient declined     Chief Complaint  Patient presents with  . Acute Visit    Hospital f/u    HPI:  Pt is a 70 y.o. female seen today for hospital f/u s/p admission at [facility] 02/19-02/23.   She currently resides at [facility]. PMH includes: hypertension, a-fib, rheumatoid arthritis, chronic kidney disease stage III, acute ischemic stroke, small vessel disease, colitis, SIRS, depression and malnutrition.   Prior to hospitalization she lived at home, daughter and son live nearby. 01/31- 02/05 she was hospitalized with AKI, urinary retention and colonic stricture- treated with colonoscopy. 02/12-02/15 she was admitted for recurrent AKI and urinary retention- discharged home with foley catheter. 02/17 she presented to ED because she pulled her foley out, bladder scan unremarkable- discharged home without foley. 02/19 daughter noticed patient had increased confusion and blood pressure was elevated. Unclear if patient was taking medications. Daughter found depends on kitchen floor, stated patient did not recognize daughter. In the ED, SBP elevated to 170-180's, other vitals stable. WBC 9.5. HGB 11.2. NA 144. K 3.8. BUN 14. Creat 1.58. Calcium 9.2. AST 17. ALT 14.  Blood sugar 54. Bladder scan with 197 cc urine. CT head unremarkable. CT abdomen/pelvis showed colonic stricture at hepatic flexure- similar to previous with moderate to large stool along transverse colon. ED physician stated she was alert and oriented, but questioned expressive aphasia. She was admitted for altered mental status- metabolic/hypertensive encephalopathy versus stroke/TIA. MRI brain showed advanced chronic small vessel disease with tiny acute infarction in the left lateral cerebellum. Polypharmacy also suspected with acute illness. Active  infection ruled out. Her mental status improved during hospitalization. Carotid doppler pending. Echo pending.Aspirin and lipitor started. Goal LDL < 70. Hypoglycemia thought to be caused by chronic use of hydroxychloroquine. Morning cortisol < 4. Cortisol 6.3. 30 min post ACTH 16.6. 60 min post ACTH 19.7. Hypertension resolved during stay, no changes to medications. Colonic stricture and constipation treated with aggressive laxatives. Advised to stay on lactulose. CKD improved to baseline. Abnormal urine cultures were not treated with antibiotics. PT recommended snf for additional therapy service.   Today, she is sitting in her recliner. She is alert to self, person, situation and place. Disoriented to time. Follows commands and can express needs. There were two times during our conversation she remained quiet and did not answer. Upset the lactulose is making her have multiple trips to bathroom at night. Asking for medication time to be adjusted. Voiding well.  Ambulating about 20 feet with walker. Minimal assistance with ADLs. Appetite fair. She denies pain and shortness of breath.   No recent falls or injuries.   Recent blood pressures are as follows:  02/24- 170/78, 161/90  02/23- 110/68, 170/82  Facility nurse does not report any concerns, vitals stable.  Past Medical History:  Diagnosis Date  . Acute on chronic kidney failure (HCC)   .  Anemia   . Arthritis   . Chest pain   . Diverticulitis   . Hypertension   . Stroke (HCC)   . UTI (urinary tract infection) 10/2019   Past Surgical History:  Procedure Laterality Date  . BIOPSY  04/01/2019   Procedure: BIOPSY;  Surgeon: Kathi Der, MD;  Location: [facility] ENDOSCOPY;  Service: Gastroenterology;;  . BIOPSY  10/05/2019   Procedure: BIOPSY;  Surgeon: Kathi Der, MD;  Location: [facility] ENDOSCOPY;  Service: Gastroenterology;;  . BIOPSY  05/10/2020   Procedure: BIOPSY;  Surgeon: Kathi Der, MD;  Location: [facility] ENDOSCOPY;  Service: Gastroenterology;;  . BIOPSY  11/29/2020   Procedure: BIOPSY;  Surgeon: Charlott Rakes, MD;  Location: [facility] ENDOSCOPY;  Service: Gastroenterology;;  . COLONOSCOPY N/A 11/29/2020   Procedure: COLONOSCOPY;  Surgeon: Charlott Rakes, MD;  Location: [facility] ENDOSCOPY;  Service: Gastroenterology;  Laterality: N/A;  . COLONOSCOPY WITH PROPOFOL N/A 04/01/2019   Procedure: COLONOSCOPY WITH PROPOFOL;  Surgeon: Kathi Der, MD;  Location: [facility] ENDOSCOPY;  Service: Gastroenterology;  Laterality: N/A;  . COLONOSCOPY WITH PROPOFOL N/A 10/05/2019   Procedure: COLONOSCOPY WITH PROPOFOL;  Surgeon: Kathi Der, MD;  Location: [facility] ENDOSCOPY;  Service: Gastroenterology;  Laterality: N/A;  . COLONOSCOPY WITH PROPOFOL N/A 05/10/2020   Procedure: COLONOSCOPY WITH PROPOFOL;  Surgeon: Kathi Der, MD;  Location: [facility] ENDOSCOPY;  Service: Gastroenterology;  Laterality: N/A;  . ESOPHAGOGASTRODUODENOSCOPY (EGD) WITH PROPOFOL N/A 04/01/2019   Procedure: ESOPHAGOGASTRODUODENOSCOPY (EGD) WITH PROPOFOL;  Surgeon: Kathi Der, MD;  Location: [facility] ENDOSCOPY;  Service: Gastroenterology;  Laterality: N/A;  . ESOPHAGOGASTRODUODENOSCOPY (EGD) WITH PROPOFOL N/A 10/05/2019   Procedure: ESOPHAGOGASTRODUODENOSCOPY (EGD) WITH PROPOFOL;  Surgeon: Kathi Der, MD;  Location: [facility] ENDOSCOPY;  Service: Gastroenterology;  Laterality: N/A;  . POLYPECTOMY  11/29/2020   Procedure:  POLYPECTOMY;  Surgeon: Charlott Rakes, MD;  Location: [facility] ENDOSCOPY;  Service: Gastroenterology;;  . Sunnie Nielsen TATTOO INJECTION  11/29/2020   Procedure: SUBMUCOSAL TATTOO INJECTION;  Surgeon: Charlott Rakes, MD;  Location: [facility] ENDOSCOPY;  Service: Gastroenterology;;  . TONSILLECTOMY      No Known Allergies  Allergies as of 12/20/2020   No Known Allergies     Medication List       Accurate as of December 20, 2020 10:53 AM. If you have any questions, ask your nurse or doctor.        aspirin 81 MG EC tablet Take 81 mg by mouth daily.   atorvastatin 40 MG tablet Commonly known as: LIPITOR Take 1 tablet (40 mg total) by mouth daily.   carvedilol 25 MG tablet Commonly known as: COREG Take 25 mg by mouth 2 (two) times daily.   cloNIDine 0.3 MG tablet Commonly known as: CATAPRES Take 0.3 mg by mouth 2 (two) times daily.   diltiazem 180 MG 24 hr capsule Commonly known as: CARDIZEM CD Take 1 capsule (180 mg total) by mouth daily. What changed: when to take this   docusate sodium 100 MG capsule Commonly known as: COLACE Take 1 capsule (100 mg total) by mouth 2 (two) times daily.   DULoxetine 60 MG capsule Commonly known as: CYMBALTA Take 60 mg by mouth at bedtime.   hydroxychloroquine 200 MG tablet Commonly known as: PLAQUENIL Take 200 mg by mouth daily.   isosorbide mononitrate 30 MG 24 hr tablet Commonly known as: IMDUR Take 30 mg by mouth daily.   lactulose 10 GM/15ML solution Commonly known as: CHRONULAC Take 30 mLs (20  g total) by mouth 2 (two) times daily.   multivitamin with minerals Tabs tablet Take 1 tablet by mouth daily.   Myrbetriq 25 MG Tb24 tablet Generic drug: mirabegron ER Take 1 tablet (25 mg total) by mouth daily.   pantoprazole 40 MG tablet Commonly known as: PROTONIX Take 1 tablet (40 mg total) by mouth daily.   polyethylene glycol 17 g packet Commonly known as: MIRALAX / GLYCOLAX Take 17 g by mouth daily as needed for mild  constipation.   predniSONE 5 MG tablet Commonly known as: DELTASONE Take 5 mg by mouth daily.   QUEtiapine 100 MG tablet Commonly known as: SEROQUEL Take 100 mg by mouth daily.   tamsulosin 0.4 MG Caps capsule Commonly known as: Flomax Take 1 capsule (0.4 mg total) by mouth daily after supper.       Review of Systems  Constitutional: Negative for activity change, appetite change and fever.  HENT: Negative for dental problem, hearing loss and trouble swallowing.   Eyes: Negative for visual disturbance.  Respiratory: Negative for cough, shortness of breath and wheezing.   Cardiovascular: Positive for leg swelling. Negative for chest pain.  Gastrointestinal: Positive for constipation. Negative for abdominal distention, abdominal pain, diarrhea and nausea.  Genitourinary: Positive for frequency. Negative for dysuria and hematuria.  Musculoskeletal: Positive for arthralgias and myalgias.  Skin:       Dry skin  Neurological: Positive for weakness. Negative for dizziness, light-headedness and headaches.  Psychiatric/Behavioral: Positive for confusion. Negative for dysphoric mood and sleep disturbance. The patient is not nervous/anxious.     There is no immunization history for the selected administration types on file for this patient. Pertinent  Health Maintenance Due  Topic Date Due  . DEXA SCAN  Never done  . MAMMOGRAM  10/10/2021  . COLONOSCOPY (Pts 45-40yrs Insurance coverage will need to be confirmed)  11/29/2030  . INFLUENZA VACCINE  Completed  . PNA vac Low Risk Adult  Completed   No flowsheet data found. Functional Status Survey:    Vitals:   12/20/20 1045  BP: (!) 161/90  Pulse: 77  Resp: 18  Temp: 98.6 F (37 C)  Weight: 173 lb (78.5 kg)  Height: 5\' 7"  (1.702 m)   Body mass index is 27.1 kg/m. Physical Exam Vitals reviewed.  Constitutional:      General: She is not in acute distress. HENT:     Head: Normocephalic.     Right Ear: There is no impacted  cerumen.     Left Ear: There is no impacted cerumen.     Nose: Nose normal.     Mouth/Throat:     Mouth: Mucous membranes are moist.  Eyes:     General:        Right eye: No discharge.        Left eye: No discharge.  Cardiovascular:     Rate and Rhythm: Normal rate. Rhythm irregular.     Pulses: Normal pulses.     Heart sounds: Normal heart sounds. No murmur heard.   Pulmonary:     Effort: Pulmonary effort is normal. No respiratory distress.     Breath sounds: Normal breath sounds. No wheezing.  Abdominal:     General: Abdomen is flat. Bowel sounds are normal. There is no distension.     Palpations: Abdomen is soft.     Tenderness: There is no abdominal tenderness.  Musculoskeletal:     Cervical back: Normal range of motion.     Right lower leg: Edema  present.     Left lower leg: Edema present.     Comments: +1  Lymphadenopathy:     Cervical: No cervical adenopathy.  Skin:    General: Skin is warm and dry.     Capillary Refill: Capillary refill takes less than 2 seconds.  Neurological:     General: No focal deficit present.     Mental Status: She is alert and oriented to person, place, and time. Mental status is at baseline.     Motor: Weakness present.     Gait: Gait abnormal.     Comments: walker  Psychiatric:        Mood and Affect: Mood normal.        Behavior: Behavior normal.     Comments: possible expressive aphasia     Labs reviewed: Recent Labs    12/11/20 0755 12/15/20 1150 12/17/20 0253 12/18/20 0245 12/19/20 0635  NA 139   < > 139 138 139  K 4.7   < > 3.6 3.9 4.5  CL 113*   < > 108 105 104  CO2 18*   < > 23 24 24   GLUCOSE 117*   < > 108* 95 101*  BUN 33*   < > 10 11 18   CREATININE 1.81*   < > 1.57* 1.59* 1.82*  CALCIUM 8.4*   < > 8.5* 8.9 8.8*  MG 1.4*  --  1.4* 1.9  --   PHOS  --   --  3.3  --   --    < > = values in this interval not displayed.   Recent Labs    12/17/20 0253 12/18/20 0245 12/19/20 0635  AST 14* 14* 14*  ALT 12 12 12    ALKPHOS 52 64 75  BILITOT 0.6 0.6 0.4  PROT 5.6* 5.7* 5.8*  ALBUMIN 2.8* 2.9* 2.8*   Recent Labs    12/10/20 0747 12/11/20 0755 12/15/20 1150 12/15/20 2334 12/17/20 0253  WBC 8.3 8.3 9.5 8.3 7.0  NEUTROABS 5.0 4.3  --   --  3.5  HGB 9.5* 9.2* 11.2* 10.9* 10.2*  HCT 30.0* 30.1* 35.6* 32.6* 30.2*  MCV 96.2 97.7 97.3 93.4 94.7  PLT 163 181 215 209 181   Lab Results  Component Value Date   TSH 1.646 05/10/2020   Lab Results  Component Value Date   HGBA1C 6.3 (H) 12/16/2020   Lab Results  Component Value Date   CHOL 179 12/16/2020   HDL 75 12/16/2020   LDLCALC 93 12/16/2020   TRIG 55 12/16/2020   CHOLHDL 2.4 12/16/2020    Significant Diagnostic Results in last 30 days:  CT ABDOMEN PELVIS WO CONTRAST  Result Date: 12/15/2020 CLINICAL DATA:  Bowel obstruction suspected.  Abdominal pain. EXAM: CT ABDOMEN AND PELVIS WITHOUT CONTRAST TECHNIQUE: Multidetector CT imaging of the abdomen and pelvis was performed following the standard protocol without IV contrast. COMPARISON:  CT dated November 26, 2020. FINDINGS: Lower chest: There is chronic scarring at the lung bases. There are trace bilateral pleural effusions.The heart is enlarged. Hepatobiliary: The liver is normal. Normal gallbladder.There is no biliary ductal dilation. Pancreas: Normal contours without ductal dilatation. No peripancreatic fluid collection. Spleen: Unremarkable. Adrenals/Urinary Tract: --Adrenal glands: Unremarkable. --Right kidney/ureter: No hydronephrosis or radiopaque kidney stones. --Left kidney/ureter: No hydronephrosis or radiopaque kidney stones. --Urinary bladder: Unremarkable. Stomach/Bowel: --Stomach/Duodenum: No hiatal hernia or other gastric abnormality. Normal duodenal course and caliber. --Small bowel: Unremarkable. --Colon: Rectosigmoid diverticulosis without acute inflammation. Again noted is significant narrowing at the hepatic flexure,  chronic across prior studies. There is a large amount of stool  in the transverse colon and right hemicolon. --Appendix: Normal. Vascular/Lymphatic: Atherosclerotic calcification is present within the non-aneurysmal abdominal aorta, without hemodynamically significant stenosis. --No retroperitoneal lymphadenopathy. --No mesenteric lymphadenopathy. --No pelvic or inguinal lymphadenopathy. Reproductive: Unremarkable Other: No ascites or free air. The abdominal wall is normal. Musculoskeletal. A chronic appearing compression fracture is noted of the L5 vertebral body. IMPRESSION: 1. No acute abdominopelvic abnormality. 2. Again noted is significant narrowing at the hepatic flexure of the colon, chronic across prior studies. 3. Large amount of stool in the transverse colon and right hemicolon. 4. Rectosigmoid diverticulosis without acute inflammation. 5. Trace bilateral pleural effusions with chronic scarring at the lung bases. Aortic Atherosclerosis (ICD10-I70.0). Electronically Signed   By: Katherine Mantle M.D.   On: 12/15/2020 16:31   CT Abdomen Pelvis Wo Contrast  Result Date: 11/26/2020 CLINICAL DATA:  Diarrhea.  Generalized weakness.  Colonic stricture. EXAM: CT ABDOMEN AND PELVIS WITHOUT CONTRAST TECHNIQUE: Multidetector CT imaging of the abdomen and pelvis was performed following the standard protocol without IV contrast. COMPARISON:  Virtual colonoscopy 02/15/2020, single-contrast barium enema 05/08/2020. FINDINGS: Lower chest: Mild scarring at the lung bases. Hepatobiliary: No focal hepatic lesion on noncontrast exam. Gallbladder normal Pancreas: Pancreas is normal. No ductal dilatation. No pancreatic inflammation. Spleen: Normal spleen Adrenals/urinary tract: Adrenal glands normal. Nephrolithiasis ureterolithiasis. Bladder normal Stomach/Bowel: Stomach, duodenum and small bowel normal. Terminal ileum is normal. Appendix not identified. There is fluid stool in the ascending colon. There is several large stool balls in the distal transverse colon leading up the  splenic flexure. These have a chronic appearance concentric lamellations (image 34/3). There is a caliber change at the splenic flexure leading to collapsed descending colon. This caliber change present on image 20/3 of axial imaging. A stricturing is well seen on image 150/series 7/sagittal imaging. There several large stool ball again retained proximal to this stricturing of the splenic flexure. Several diverticula descending colon.  Fluid within the rectum. Vascular/Lymphatic: Abdominal aorta is normal caliber with atherosclerotic calcification. There is no retroperitoneal or periportal lymphadenopathy. No pelvic lymphadenopathy. Reproductive: Uterus and adnexa unremarkable. Other: No free fluid or free air. Musculoskeletal: No aggressive osseous lesion. IMPRESSION: 1. 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 Three large chronic lamellated stool balls proximal to this stricturing. Differential includes benign and malignant stricturing of the colon. 2. No lymphadenopathy. Electronically Signed   By: Genevive Bi M.D.   On: 11/26/2020 14:52   DG Chest 2 View  Result Date: 11/22/2020 CLINICAL DATA:  Chest pain EXAM: CHEST - 2 VIEW COMPARISON:  September 26, 2020 FINDINGS: The heart size is stable. The aorta remains dilated as before. The lung volumes are low. Bibasilar airspace disease is noted favored to represent atelectasis. There is no pneumothorax. No acute osseous abnormality. IMPRESSION: Low lung volumes with bibasilar atelectasis. Electronically Signed   By: Katherine Mantle M.D.   On: 11/22/2020 02:02   CT Head Wo Contrast  Result Date: 12/15/2020 CLINICAL DATA:  Mental status changes, altered mental status, abdominal pain, recent small-bowel obstruction just released from hospital 4 days ago EXAM: CT HEAD WITHOUT CONTRAST TECHNIQUE: Contiguous axial images were obtained from the base of the skull through the vertex without intravenous contrast. COMPARISON:  06/16/2019  FINDINGS: Brain: Generalized atrophy. Cavum septum pellucidum. Otherwise normal ventricular morphology. No midline shift or mass effect. Small vessel chronic ischemic changes of deep cerebral white matter. No intracranial hemorrhage, mass lesion, evidence of  acute infarction, or extra-axial fluid collection. Vascular: Atherosclerotic calcifications of internal carotid arteries at skull base Skull: Intact Sinuses/Orbits: Clear Other: N/A IMPRESSION: Atrophy with small vessel chronic ischemic changes of deep cerebral white matter. No acute intracranial abnormalities. Electronically Signed   By: Ulyses Southward M.D.   On: 12/15/2020 16:32   MR BRAIN WO CONTRAST  Result Date: 12/15/2020 CLINICAL DATA:  Mental status changes of unknown cause. EXAM: MRI HEAD WITHOUT CONTRAST TECHNIQUE: Multiplanar, multiecho pulse sequences of the brain and surrounding structures were obtained without intravenous contrast. COMPARISON:  Head CT same day FINDINGS: Brain: Diffusion imaging shows a tiny acute infarction within the left cerebellum. No other acute finding. Mild chronic small-vessel ischemic changes of the pons. Few old small vessel cerebellar infarctions, including 1 with hemosiderin deposition on the right. Cerebral hemispheres show old infarction in the right basal ganglia and radiating white matter tracts. Old small vessel thalamic infarctions. There is confluent chronic small vessel disease throughout the hemispheric white matter. No large vessel territory infarction. No mass lesion, hydrocephalus or extra-axial collection. Numerous foci of hemosiderin deposition associated with the old small vessel infarctions. Vascular: Major vessels at the base of the brain show flow. 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 Hemosiderin deposition associated with many of the old small vessel infarctions.  There appears to be a tiny acute infarction in the left lateral cerebellum. It would be surprising if this were the cause of the clinical presentation, as one might expect this to be subclinical. Electronically Signed   By: Paulina Fusi M.D.   On: 12/15/2020 21:35   US RENAL  Result Date: 12/08/2020 CLINICAL DATA:  Renal failure. EXAM: RENAL / URINARY TRACT ULTRASOUND COMPLETE COMPARISON:  None. FINDINGS: Right Kidney: Renal measurements: 8.5 x 4.5 x 4.7 cm = volume: 92.77 mL. Increased parenchymal echogenicity. Cortical thinning with some lobulation of the contour. No mass. No stone or hydronephrosis. Left Kidney: Renal measurements: 8.2 x 4.5 x 4.2 cm = volume: 82.1 mL. Increased parenchymal echogenicity. Renal cortical thinning. Lobulated contour. No mass, stone or hydronephrosis. Bladder: Distended.  Otherwise unremarkable. Other: None. IMPRESSION: 1. Findings consistent with medical renal disease with reduced renal sizes, increased renal parenchymal echogenicity and renal cortical thinning. No masses, stones or hydronephrosis. 2. Distended bladder. Electronically Signed   By: Amie Portland M.D.   On: 12/08/2020 17:07   DG Chest Portable 1 View  Result Date: 12/15/2020 CLINICAL DATA:  Altered mental status, oriented to name only, unable to follow commands, discharge from hospital on Tuesday for small-bowel obstruction, last seen normal yesterday EXAM: PORTABLE CHEST 1 VIEW COMPARISON:  Portable exam 2000 hours compared to 11/22/2020 FINDINGS: Enlargement of cardiac silhouette. Mediastinal contours and pulmonary vascularity normal. Minimal LEFT base atelectasis. Lungs otherwise clear. No acute infiltrate, pleural effusion or pneumothorax. Bones demineralized. IMPRESSION: Enlargement of cardiac silhouette with minimal LEFT basilar atelectasis. Electronically Signed   By: Ulyses Southward M.D.   On: 12/15/2020 20:09   DG Abdomen Acute W/Chest  Result Date: 12/08/2020 CLINICAL DATA:  Constipation. EXAM: DG  ABDOMEN ACUTE WITH 1 VIEW CHEST COMPARISON:  CT scan 11/26/2020 FINDINGS: The upright chest x-ray demonstrates normal heart size and mild tortuosity of the thoracic aorta. No acute pulmonary findings. No pleural effusion. No pulmonary lesions. Two views of the abdomen demonstrate the moderate amount of stool in the right and proximal transverse colon. I do not see any significant stool in  the left colon, sigmoid colon or rectum. No distended small bowel loops to suggest obstruction. The soft tissue shadows are grossly maintained. No worrisome calcifications. No significant bony findings. IMPRESSION: 1. No acute cardiopulmonary findings. 2. Moderate stool in the right and proximal transverse colon. Electronically Signed   By: Rudie Meyer M.D.   On: 12/08/2020 16:57   ECHOCARDIOGRAM COMPLETE  Result Date: 12/16/2020    ECHOCARDIOGRAM REPORT   Patient Name:   Michelle Graves Date of Exam: 12/16/2020 Medical Rec #:  [id number]         Height:       67.0 in Accession #:    [id number]        Weight:       173.0 lb Date of Birth:  02/27/1951          BSA:          1.901 m Patient Age:    70 years          BP:           138/95 mmHg Patient Gender: F                 HR:           75 bpm. Exam Location:  Inpatient Procedure: 2D Echo, Cardiac Doppler and Color Doppler Indications:    TIA 435.9 / G45.9  History:        Patient has prior history of Echocardiogram examinations, most                 recent 05/10/2020. Stroke, Arrythmias:Atrial Fibrillation; Risk                 Factors:Hypertension. Acute on chronic renal failure.  Sonographer:    Celesta Gentile RCS Referring Phys: [id number] Alessandra Bevels IMPRESSIONS  1. Left ventricular ejection fraction, by estimation, is 55 to 60%. The left ventricle has normal function. The left ventricle has no regional wall motion abnormalities. There is moderate concentric left ventricular hypertrophy. Left ventricular diastolic parameters are consistent with Grade I diastolic  dysfunction (impaired relaxation). Elevated left atrial pressure.  2. Right ventricular systolic function is normal. The right ventricular size is normal. There is normal pulmonary artery systolic pressure. The estimated right ventricular systolic pressure is 27.4 mmHg.  3. Left atrial size was mildly dilated.  4. The mitral valve is normal in structure. Mild mitral valve regurgitation. No evidence of mitral stenosis.  5. The aortic valve is normal in structure. Aortic valve regurgitation is mild. Mild to moderate aortic valve sclerosis/calcification is present, without any evidence of aortic stenosis.  6. The inferior vena cava is normal in size with greater than 50% respiratory variability, suggesting right atrial pressure of 3 mmHg. FINDINGS  Left Ventricle: Left ventricular ejection fraction, by estimation, is 55 to 60%. The left ventricle has normal function. The left ventricle has no regional wall motion abnormalities. The left ventricular internal cavity size was normal in size. There is  moderate concentric left ventricular hypertrophy. Left ventricular diastolic parameters are consistent with Grade I diastolic dysfunction (impaired relaxation). Elevated left atrial pressure. Right Ventricle: The right ventricular size is normal. No increase in right ventricular wall thickness. Right ventricular systolic function is normal. There is normal pulmonary artery systolic pressure. The tricuspid regurgitant velocity is 2.47 m/s, and  with an assumed right atrial pressure of 3 mmHg, the estimated right ventricular systolic pressure is 27.4 mmHg. Left Atrium: Left atrial size was mildly dilated. Right Atrium: Right  atrial size was normal in size. Pericardium: There is no evidence of pericardial effusion. Mitral Valve: The mitral valve is normal in structure. Mild mitral valve regurgitation. No evidence of mitral valve stenosis. Tricuspid Valve: The tricuspid valve is normal in structure. Tricuspid valve regurgitation  is not demonstrated. No evidence of tricuspid stenosis. Aortic Valve: The aortic valve is normal in structure. Aortic valve regurgitation is mild. Aortic regurgitation PHT measures 599 msec. Mild to moderate aortic valve sclerosis/calcification is present, without any evidence of aortic stenosis. Pulmonic Valve: The pulmonic valve was normal in structure. Pulmonic valve regurgitation is not visualized. No evidence of pulmonic stenosis. Aorta: The aortic root is normal in size and structure. Venous: The inferior vena cava is normal in size with greater than 50% respiratory variability, suggesting right atrial pressure of 3 mmHg. IAS/Shunts: No atrial level shunt detected by color flow Doppler.  LEFT VENTRICLE PLAX 2D LVIDd:         3.90 cm  Diastology LVIDs:         2.80 cm  LV e' medial:    3.59 cm/s LV PW:         1.80 cm  LV E/e' medial:  14.3 LV IVS:        1.50 cm  LV e' lateral:   4.57 cm/s LVOT diam:     2.00 cm  LV E/e' lateral: 11.2 LV SV:         57 LV SV Index:   30 LVOT Area:     3.14 cm  RIGHT VENTRICLE TAPSE (M-mode): 2.1 cm LEFT ATRIUM           Index       RIGHT ATRIUM           Index LA diam:      2.90 cm 1.53 cm/m  RA Area:     13.60 cm LA Vol (A2C): 56.3 ml 29.61 ml/m RA Volume:   28.80 ml  15.15 ml/m LA Vol (A4C): 85.1 ml 44.76 ml/m  AORTIC VALVE LVOT Vmax:   92.50 cm/s LVOT Vmean:  67.000 cm/s LVOT VTI:    0.183 m AI PHT:      599 msec  AORTA Ao Root diam: 3.60 cm MITRAL VALVE                TRICUSPID VALVE MV Area (PHT): 2.95 cm     TR Peak grad:   24.4 mmHg MV Decel Time: 257 msec     TR Vmax:        247.00 cm/s MV E velocity: 51.40 cm/s MV A velocity: 109.00 cm/s  SHUNTS MV E/A ratio:  0.47         Systemic VTI:  0.18 m                             Systemic Diam: 2.00 cm Tobias Alexander MD Electronically signed by Tobias Alexander MD Signature Date/Time: 12/16/2020/4:26:09 PM    Final     Assessment/Plan 1. Metabolic encephalopathy - stable, not caused by recent stroke - cont to  monitor - bmp in 1 week  2. Small vessel disease, cerebrovascular - confirmed by MRI brain  3. 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 Hypoglycemia - thought to be caused by hydrochloroquine  5. Hypertension secondary to  other renal disorders - elevated today - will increase clonidine to 0.3 mg tid - continue to limit sodium in diet < 2000 mg /day - cbc/diff in 1 week - bmp in 1 week  6. Colonic stricture (HCC) - improved with lactulose - will schedule lactulose at 0700 and 1700 for better compliance  7. Chronic kidney disease, stage 3b (HCC) - continue to avoid nephrotoxic drugs like NSAIDS and dose adjust medications to be renally excreted  8. Weakness - cont PT/OT  9. Memory impairment - history of confusion and multiple hospitalizations - possible expressive aphasia otherwise appropriate     Family/ staff Communication: Plan discussed with patient and facility nurse  Labs/tests ordered:  Cbc/diff, bmp in 1 week

## 2020-12-21 ENCOUNTER — Encounter: Payer: Self-pay | Admitting: Internal Medicine

## 2020-12-21 ENCOUNTER — Non-Acute Institutional Stay (SKILLED_NURSING_FACILITY): Payer: Medicare Other | Admitting: Internal Medicine

## 2020-12-21 DIAGNOSIS — R531 Weakness: Secondary | ICD-10-CM

## 2020-12-21 DIAGNOSIS — I639 Cerebral infarction, unspecified: Secondary | ICD-10-CM | POA: Diagnosis not present

## 2020-12-21 DIAGNOSIS — E162 Hypoglycemia, unspecified: Secondary | ICD-10-CM

## 2020-12-21 DIAGNOSIS — I151 Hypertension secondary to other renal disorders: Secondary | ICD-10-CM

## 2020-12-21 DIAGNOSIS — I679 Cerebrovascular disease, unspecified: Secondary | ICD-10-CM

## 2020-12-21 DIAGNOSIS — N1832 Chronic kidney disease, stage 3b: Secondary | ICD-10-CM

## 2020-12-21 DIAGNOSIS — N2889 Other specified disorders of kidney and ureter: Secondary | ICD-10-CM

## 2020-12-21 DIAGNOSIS — G9341 Metabolic encephalopathy: Secondary | ICD-10-CM

## 2020-12-21 DIAGNOSIS — M05749 Rheumatoid arthritis with rheumatoid factor of unspecified hand without organ or systems involvement: Secondary | ICD-10-CM

## 2020-12-21 DIAGNOSIS — K56699 Other intestinal obstruction unspecified as to partial versus complete obstruction: Secondary | ICD-10-CM

## 2020-12-21 NOTE — Progress Notes (Signed)
Provider:  Gwenith Spitz. Renato Gails, D.O., C.M.D. Location:  [facility] Room Number: 102P Place of Service:  SNF (31)  PCP: [facility], [facility] Patient Care Team: [facility], [facility] as PCP - General (Family Medicine)  Extended Emergency Contact Information Primary Emergency Contact: McRae,Toni Address: [address]          [name] [address] [place] Home Phone: [phone] Relation: Daughter  Code Status: Full Code Goals of Care: Advanced Directive information Advanced Directives 12/08/2020  Does Patient Have a Medical Advance Directive? No  Would patient like information on creating a medical advance directive? No - Patient declined      Chief Complaint  Patient presents with  . New Admit To SNF    Admission to [facility] SNF    HPI: Patient is a 70 y.o. female seen today for [facility] status post hospitalization from February 19-23 with change in mental status and subacute stroke.  Ms. Lindvall has a past medical history significant for rheumatoid arthritis, adrenal insufficiency, atrial fibrillation, prior some cerebral thrombosis with infarction, colonic stricture, CMV colitis, diverticulitis,Chronic kidney disease stage III.  This time she presented with delirium.  She had prior hospitalizations for urinary retention acute kidney injury, colonic stricture, Foley dislodgment, etc.  During this last admission she was brought back by her daughter who noted the patient is more confused than usual.  In the ER her blood pressure was 200 systolic, CT of the head was normal, creatinine was 1.58 which is about baseline.  She did not have urinary retention.  CT of the abdomen and pelvis showed the colonic stricture that was well known and a large amount of stool along with transverse colon in the right hemicolon.  No evidence of active infection.  Her MRI did show very  small subacute left cerebellar stroke.  Initially mildly hypoglycemic.  Her A1c was 6.3. Dopplers and echo were still pending when she was discharged.  To follow-up outpatient with neurology.  Her home blood pressure medications were continued.  Urine culture grew out staph epidermidis which is typically contaminant.  After multiple hospital stays she has profound deconditioning was admitted here for PT OT.  When seen she was feeling fine and had no complaints.  Nursing also had no concerns about her.  Reported being eager to go home.  Past Medical History:  Diagnosis Date  . Acute on chronic kidney failure (HCC)   . Anemia   . Arthritis   . Chest pain   . Diverticulitis   . Hypertension   . Stroke (HCC)   . UTI (urinary tract infection) 10/2019   Past Surgical History:  Procedure Laterality Date  . BIOPSY  04/01/2019   Procedure: BIOPSY;  Surgeon: Kathi Der, MD;  Location: [facility] ENDOSCOPY;  Service: Gastroenterology;;  . BIOPSY  10/05/2019   Procedure: BIOPSY;  Surgeon: Kathi Der, MD;  Location: [facility] ENDOSCOPY;  Service: Gastroenterology;;  . BIOPSY  05/10/2020   Procedure: BIOPSY;  Surgeon: Kathi Der, MD;  Location: [facility] ENDOSCOPY;  Service: Gastroenterology;;  . BIOPSY  11/29/2020   Procedure: BIOPSY;  Surgeon: Charlott Rakes, MD;  Location: [facility] ENDOSCOPY;  Service: Gastroenterology;;  . COLONOSCOPY N/A 11/29/2020   Procedure: COLONOSCOPY;  Surgeon: Charlott Rakes, MD;  Location: [facility] ENDOSCOPY;  Service: Gastroenterology;  Laterality: N/A;  . COLONOSCOPY WITH PROPOFOL N/A 04/01/2019   Procedure: COLONOSCOPY WITH PROPOFOL;  Surgeon: Kathi Der, MD;  Location: [facility] ENDOSCOPY;  Service: Gastroenterology;  Laterality: N/A;  . COLONOSCOPY WITH PROPOFOL N/A 10/05/2019   Procedure: COLONOSCOPY WITH PROPOFOL;  Surgeon: Kathi Der, MD;  Location: [facility] ENDOSCOPY;  Service: Gastroenterology;  Laterality: N/A;  . COLONOSCOPY WITH PROPOFOL N/A 05/10/2020   Procedure:  COLONOSCOPY WITH PROPOFOL;  Surgeon: Kathi Der, MD;  Location: [facility] ENDOSCOPY;  Service: Gastroenterology;  Laterality: N/A;  . ESOPHAGOGASTRODUODENOSCOPY (EGD) WITH PROPOFOL N/A 04/01/2019   Procedure: ESOPHAGOGASTRODUODENOSCOPY (EGD) WITH PROPOFOL;  Surgeon: Kathi Der, MD;  Location: [facility] ENDOSCOPY;  Service: Gastroenterology;  Laterality: N/A;  . ESOPHAGOGASTRODUODENOSCOPY (EGD) WITH PROPOFOL N/A 10/05/2019   Procedure: ESOPHAGOGASTRODUODENOSCOPY (EGD) WITH PROPOFOL;  Surgeon: Kathi Der, MD;  Location: [facility] ENDOSCOPY;  Service: Gastroenterology;  Laterality: N/A;  . POLYPECTOMY  11/29/2020   Procedure: POLYPECTOMY;  Surgeon: Charlott Rakes, MD;  Location: [facility] ENDOSCOPY;  Service: Gastroenterology;;  . Sunnie Nielsen TATTOO INJECTION  11/29/2020   Procedure: SUBMUCOSAL TATTOO INJECTION;  Surgeon: Charlott Rakes, MD;  Location: [facility] ENDOSCOPY;  Service: Gastroenterology;;  . TONSILLECTOMY      Social History   Socioeconomic History  . Marital status: Divorced    Spouse name: Not on file  . Number of children: Not on file  . Years of education: Not on file  . Highest education level: Not on file  Occupational History  . Not on file  Tobacco Use  . Smoking status: Never Smoker  . Smokeless tobacco: Never Used  Vaping Use  . Vaping Use: Never used  Substance and Sexual Activity  . Alcohol use: Not Currently    Alcohol/week: 0.0 standard drinks    Comment: sometimes   . Drug use: No  . Sexual activity: Yes    Partners: Male    Birth control/protection: Condom  Other Topics Concern  . Not on file  Social History Narrative  . Not on file   Social Determinants of Health   Financial Resource Strain: Not on file  Food Insecurity: Not on file  Transportation Needs: Not on file  Physical Activity: Not on file  Stress: Not on file  Social Connections: Not on file    reports that she has never smoked. She has never used smokeless tobacco. She reports previous alcohol  use. She reports that she does not use drugs.  Functional Status Survey:    Family History  Problem Relation Age of Onset  . Hypertension Mother   . Diabetes Mother   . CAD Father        died of MI at age 88  . Hypertension Father   . Diabetes Sister   . Diabetes Sister   . Kidney disease Neg Hx     Health Maintenance  Topic Date Due  . TETANUS/TDAP  Never done  . DEXA SCAN  Never done  . MAMMOGRAM  10/10/2021  . COLONOSCOPY (Pts 45-28yrs Insurance coverage will need to be confirmed)  11/29/2030  . INFLUENZA VACCINE  Completed  . COVID-19 Vaccine  Completed  . Hepatitis C Screening  Completed  . PNA vac Low Risk Adult  Completed    No Known Allergies  Outpatient Encounter Medications as of 12/21/2020  Medication Sig  . aspirin 81 MG EC tablet Take 81 mg by mouth daily.  Marland Kitchen atorvastatin (LIPITOR) 40 MG tablet Take 1 tablet (40 mg total) by mouth daily.  . carvedilol (COREG) 25 MG tablet Take 25 mg by mouth 2 (two) times daily.  . cloNIDine (CATAPRES) 0.3 MG tablet Take 0.3 mg by mouth 3 (three) times daily.  Marland Kitchen  diltiazem (CARDIZEM CD) 180 MG 24 hr capsule Take 180 mg by mouth 2 (two) times daily.  Marland Kitchen docusate sodium (COLACE) 100 MG capsule Take 1 capsule (100 mg total) by mouth 2 (two) times daily.  . DULoxetine (CYMBALTA) 60 MG capsule Take 60 mg by mouth at bedtime.   . hydroxychloroquine (PLAQUENIL) 200 MG tablet Take 200 mg by mouth daily.  . isosorbide mononitrate (IMDUR) 30 MG 24 hr tablet Take 30 mg by mouth daily.  Marland Kitchen lactulose (CHRONULAC) 10 GM/15ML solution Take 30 mLs (20 g total) by mouth 2 (two) times daily.  . Multiple Vitamin (MULTIVITAMIN WITH MINERALS) TABS tablet Take 1 tablet by mouth daily.  Marland Kitchen MYRBETRIQ 25 MG TB24 tablet Take 1 tablet (25 mg total) by mouth daily.  . pantoprazole (PROTONIX) 40 MG tablet Take 1 tablet (40 mg total) by mouth daily.  . polyethylene glycol (MIRALAX / GLYCOLAX) 17 g packet Take 17 g by mouth daily as needed for mild  constipation.  . predniSONE (DELTASONE) 5 MG tablet Take 5 mg by mouth daily.  . QUEtiapine (SEROQUEL) 100 MG tablet Take 100 mg by mouth daily.  . tamsulosin (FLOMAX) 0.4 MG CAPS capsule Take 1 capsule (0.4 mg total) by mouth daily after supper.  . [DISCONTINUED] diltiazem (CARDIZEM CD) 180 MG 24 hr capsule Take 1 capsule (180 mg total) by mouth daily. (Patient taking differently: Take 180 mg by mouth 2 (two) times daily.)   No facility-administered encounter medications on file as of 12/21/2020.    Review of Systems  Constitutional: Negative for chills and fever.  HENT: Negative for congestion and sore throat.   Eyes: Negative for blurred vision.  Respiratory: Negative for cough and shortness of breath.   Cardiovascular: Negative for chest pain, palpitations and leg swelling.  Gastrointestinal: Negative for abdominal pain and constipation.  Genitourinary: Negative for dysuria.  Musculoskeletal: Negative for falls and joint pain.  Neurological: Positive for weakness. Negative for dizziness, focal weakness and loss of consciousness.  Endo/Heme/Allergies:       Hypoglycemia   Psychiatric/Behavioral: Positive for memory loss. Negative for depression. The patient is not nervous/anxious and does not have insomnia.     Vitals:   12/21/20 1523  BP: 130/64  Pulse: 69  Resp: 18  Temp: 97.9 F (36.6 C)  Weight: 173 lb (78.5 kg)  Height: 5\' 7"  (1.702 m)   Body mass index is 27.1 kg/m. Physical Exam Vitals reviewed.  Constitutional:      General: She is not in acute distress.    Appearance: Normal appearance. She is not toxic-appearing.  HENT:     Head: Normocephalic and atraumatic.     Right Ear: External ear normal.     Left Ear: External ear normal.     Nose: Nose normal.     Mouth/Throat:     Pharynx: Oropharynx is clear.  Eyes:     Conjunctiva/sclera: Conjunctivae normal.     Pupils: Pupils are equal, round, and reactive to light.  Cardiovascular:     Rate and Rhythm:  Normal rate and regular rhythm.     Pulses: Normal pulses.     Heart sounds: Normal heart sounds.  Pulmonary:     Effort: Pulmonary effort is normal.     Breath sounds: Normal breath sounds.  Abdominal:     General: Bowel sounds are normal. There is no distension.     Palpations: Abdomen is soft.     Tenderness: There is no abdominal tenderness. There is no guarding or  rebound.  Musculoskeletal:        General: Normal range of motion.     Cervical back: Neck supple.     Right lower leg: No edema.     Left lower leg: No edema.  Lymphadenopathy:     Cervical: No cervical adenopathy.  Neurological:     General: No focal deficit present.     Mental Status: She is alert. Mental status is at baseline.     Motor: Weakness present.     Gait: Gait abnormal.  Psychiatric:        Mood and Affect: Mood normal.     Labs reviewed: Basic Metabolic Panel: Recent Labs    12/11/20 0755 12/15/20 1150 12/17/20 0253 12/18/20 0245 12/19/20 0635  NA 139   < > 139 138 139  K 4.7   < > 3.6 3.9 4.5  CL 113*   < > 108 105 104  CO2 18*   < > GLUCOSE 117*   < > 108* 95 101*  BUN 33*   < > CREATININE 1.81*   < > 1.57* 1.59* 1.82*  CALCIUM 8.4*   < > 8.5* 8.9 8.8*  MG 1.4*  --  1.4* 1.9  --   PHOS  --   --  3.3  --   --    < > = values in this interval not displayed.   Liver Function Tests: Recent Labs    12/17/20 0253 12/18/20 0245 12/19/20 0635  AST 14* 14* 14*  ALT ALKPHOS 52 64 75  BILITOT 0.6 0.6 0.4  PROT 5.6* 5.7* 5.8*  ALBUMIN 2.8* 2.9* 2.8*   Recent Labs    05/06/20 0814 11/22/20 0347 11/26/20 1035  LIPASE No results for input(s): AMMONIA in the last 8760 hours. CBC: Recent Labs    12/10/20 0747 12/11/20 0755 12/15/20 1150 12/15/20 2334 12/17/20 0253  WBC 8.3 8.3 9.5 8.3 7.0  NEUTROABS 5.0 4.3  --   --  3.5  HGB 9.5* 9.2* 11.2* 10.9* 10.2*  HCT 30.0* 30.1* 35.6* 32.6* 30.2*  MCV 96.2 97.7 97.3 93.4 94.7  PLT 163 181  215 209 181   Cardiac Enzymes: No results for input(s): CKTOTAL, CKMB, CKMBINDEX, TROPONINI in the last 8760 hours. BNP: Invalid input(s): POCBNP Lab Results  Component Value Date   HGBA1C 6.3 (H) 12/16/2020   Lab Results  Component Value Date   TSH 1.646 05/10/2020   Lab Results  Component Value Date   VITAMINB12 1,340 (H) 12/17/2020   Lab Results  Component Value Date   FOLATE 23.6 12/17/2020   Lab Results  Component Value Date   IRON 37 10/01/2019   TIBC 231 (L) 10/01/2019   FERRITIN 365 (H) 10/01/2019    Imaging and Procedures obtained prior to SNF admission: CT ABDOMEN PELVIS WO CONTRAST  Result Date: 12/15/2020 CLINICAL DATA:  Bowel obstruction suspected.  Abdominal pain. EXAM: CT ABDOMEN AND PELVIS WITHOUT CONTRAST TECHNIQUE: Multidetector CT imaging of the abdomen and pelvis was performed following the standard protocol without IV contrast. COMPARISON:  CT dated November 26, 2020. FINDINGS: Lower chest: There is chronic scarring at the lung bases. There are trace bilateral pleural effusions.The heart is enlarged. Hepatobiliary: The liver is normal. Normal gallbladder.There is no biliary ductal dilation. Pancreas: Normal contours without ductal dilatation. No peripancreatic fluid collection. Spleen: Unremarkable. Adrenals/Urinary Tract: --Adrenal glands: Unremarkable. --Right kidney/ureter: No hydronephrosis or radiopaque kidney stones. --  Left kidney/ureter: No hydronephrosis or radiopaque kidney stones. --Urinary bladder: Unremarkable. Stomach/Bowel: --Stomach/Duodenum: No hiatal hernia or other gastric abnormality. Normal duodenal course and caliber. --Small bowel: Unremarkable. --Colon: Rectosigmoid diverticulosis without acute inflammation. Again noted is significant narrowing at the hepatic flexure, chronic across prior studies. There is a large amount of stool in the transverse colon and right hemicolon. --Appendix: Normal. Vascular/Lymphatic: Atherosclerotic calcification  is present within the non-aneurysmal abdominal aorta, without hemodynamically significant stenosis. --No retroperitoneal lymphadenopathy. --No mesenteric lymphadenopathy. --No pelvic or inguinal lymphadenopathy. Reproductive: Unremarkable Other: No ascites or free air. The abdominal wall is normal. Musculoskeletal. A chronic appearing compression fracture is noted of the L5 vertebral body. IMPRESSION: 1. No acute abdominopelvic abnormality. 2. Again noted is significant narrowing at the hepatic flexure of the colon, chronic across prior studies. 3. Large amount of stool in the transverse colon and right hemicolon. 4. Rectosigmoid diverticulosis without acute inflammation. 5. Trace bilateral pleural effusions with chronic scarring at the lung bases. Aortic Atherosclerosis (ICD10-I70.0). Electronically Signed   By: Katherine Mantle M.D.   On: 12/15/2020 16:31   CT Head Wo Contrast  Result Date: 12/15/2020 CLINICAL DATA:  Mental status changes, altered mental status, abdominal pain, recent small-bowel obstruction just released from hospital 4 days ago EXAM: CT HEAD WITHOUT CONTRAST TECHNIQUE: Contiguous axial images were obtained from the base of the skull through the vertex without intravenous contrast. COMPARISON:  06/16/2019 FINDINGS: Brain: Generalized atrophy. Cavum septum pellucidum. Otherwise normal ventricular morphology. No midline shift or mass effect. Small vessel chronic ischemic changes of deep cerebral white matter. No intracranial hemorrhage, mass lesion, evidence of acute infarction, or extra-axial fluid collection. Vascular: Atherosclerotic calcifications of internal carotid arteries at skull base Skull: Intact Sinuses/Orbits: Clear Other: N/A IMPRESSION: Atrophy with small vessel chronic ischemic changes of deep cerebral white matter. No acute intracranial abnormalities. Electronically Signed   By: Ulyses Southward M.D.   On: 12/15/2020 16:32   MR BRAIN WO CONTRAST  Result Date:  12/15/2020 CLINICAL DATA:  Mental status changes of unknown cause. EXAM: MRI HEAD WITHOUT CONTRAST TECHNIQUE: Multiplanar, multiecho pulse sequences of the brain and surrounding structures were obtained without intravenous contrast. COMPARISON:  Head CT same day FINDINGS: Brain: Diffusion imaging shows a tiny acute infarction within the left cerebellum. No other acute finding. Mild chronic small-vessel ischemic changes of the pons. Few old small vessel cerebellar infarctions, including 1 with hemosiderin deposition on the right. Cerebral hemispheres show old infarction in the right basal ganglia and radiating white matter tracts. Old small vessel thalamic infarctions. There is confluent chronic small vessel disease throughout the hemispheric white matter. No large vessel territory infarction. No mass lesion, hydrocephalus or extra-axial collection. Numerous foci of hemosiderin deposition associated with the old small vessel infarctions. Vascular: Major vessels at the base of the brain show flow. 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 Hemosiderin deposition associated with many of the old small vessel infarctions. There appears to be a tiny acute infarction in the left lateral cerebellum. It would be surprising if this were the cause of the clinical presentation, as one might expect this to be subclinical. Electronically Signed   By: Paulina Fusi M.D.   On: 12/15/2020 21:35   DG Chest Portable 1 View  Result Date: 12/15/2020 CLINICAL DATA:  Altered mental status, oriented to name only, unable to follow commands, discharge from hospital on Tuesday for small-bowel obstruction, last seen normal yesterday  EXAM: PORTABLE CHEST 1 VIEW COMPARISON:  Portable exam 2000 hours compared to 11/22/2020 FINDINGS: Enlargement of cardiac silhouette. Mediastinal contours and pulmonary vascularity  normal. Minimal LEFT base atelectasis. Lungs otherwise clear. No acute infiltrate, pleural effusion or pneumothorax. Bones demineralized. IMPRESSION: Enlargement of cardiac silhouette with minimal LEFT basilar atelectasis. Electronically Signed   By: Ulyses Southward M.D.   On: 12/15/2020 20:09   ECHOCARDIOGRAM COMPLETE  Result Date: 12/16/2020    ECHOCARDIOGRAM REPORT   Patient Name:   ZARAH CARBON Date of Exam: 12/16/2020 Medical Rec #:  [id number]         Height:       67.0 in Accession #:    [id number]        Weight:       173.0 lb Date of Birth:  June 24, 1951          BSA:          1.901 m Patient Age:    70 years          BP:           138/95 mmHg Patient Gender: F                 HR:           75 bpm. Exam Location:  Inpatient Procedure: 2D Echo, Cardiac Doppler and Color Doppler Indications:    TIA 435.9 / G45.9  History:        Patient has prior history of Echocardiogram examinations, most                 recent 05/10/2020. Stroke, Arrythmias:Atrial Fibrillation; Risk                 Factors:Hypertension. Acute on chronic renal failure.  Sonographer:    Celesta Gentile RCS Referring Phys: [id number] Alessandra Bevels IMPRESSIONS  1. Left ventricular ejection fraction, by estimation, is 55 to 60%. The left ventricle has normal function. The left ventricle has no regional wall motion abnormalities. There is moderate concentric left ventricular hypertrophy. Left ventricular diastolic parameters are consistent with Grade I diastolic dysfunction (impaired relaxation). Elevated left atrial pressure.  2. Right ventricular systolic function is normal. The right ventricular size is normal. There is normal pulmonary artery systolic pressure. The estimated right ventricular systolic pressure is 27.4 mmHg.  3. Left atrial size was mildly dilated.  4. The mitral valve is normal in structure. Mild mitral valve regurgitation. No evidence of mitral stenosis.  5. The aortic valve is normal in structure. Aortic valve regurgitation  is mild. Mild to moderate aortic valve sclerosis/calcification is present, without any evidence of aortic stenosis.  6. The inferior vena cava is normal in size with greater than 50% respiratory variability, suggesting right atrial pressure of 3 mmHg. FINDINGS  Left Ventricle: Left ventricular ejection fraction, by estimation, is 55 to 60%. The left ventricle has normal function. The left ventricle has no regional wall motion abnormalities. The left ventricular internal cavity size was normal in size. There is  moderate concentric left ventricular hypertrophy. Left ventricular diastolic parameters are consistent with Grade I diastolic dysfunction (impaired relaxation). Elevated left atrial pressure. Right Ventricle: The right ventricular size is normal. No increase in right ventricular wall thickness. Right ventricular systolic function is normal. There is normal pulmonary artery systolic pressure. The tricuspid regurgitant velocity is 2.47 m/s, and  with an assumed right atrial pressure of 3 mmHg, the estimated right ventricular systolic pressure is 27.4 mmHg. Left Atrium: Left atrial  size was mildly dilated. Right Atrium: Right atrial size was normal in size. Pericardium: There is no evidence of pericardial effusion. Mitral Valve: The mitral valve is normal in structure. Mild mitral valve regurgitation. No evidence of mitral valve stenosis. Tricuspid Valve: The tricuspid valve is normal in structure. Tricuspid valve regurgitation is not demonstrated. No evidence of tricuspid stenosis. Aortic Valve: The aortic valve is normal in structure. Aortic valve regurgitation is mild. Aortic regurgitation PHT measures 599 msec. Mild to moderate aortic valve sclerosis/calcification is present, without any evidence of aortic stenosis. Pulmonic Valve: The pulmonic valve was normal in structure. Pulmonic valve regurgitation is not visualized. No evidence of pulmonic stenosis. Aorta: The aortic root is normal in size and  structure. Venous: The inferior vena cava is normal in size with greater than 50% respiratory variability, suggesting right atrial pressure of 3 mmHg. IAS/Shunts: No atrial level shunt detected by color flow Doppler.  LEFT VENTRICLE PLAX 2D LVIDd:         3.90 cm  Diastology LVIDs:         2.80 cm  LV e' medial:    3.59 cm/s LV PW:         1.80 cm  LV E/e' medial:  14.3 LV IVS:        1.50 cm  LV e' lateral:   4.57 cm/s LVOT diam:     2.00 cm  LV E/e' lateral: 11.2 LV SV:         57 LV SV Index:   30 LVOT Area:     3.14 cm  RIGHT VENTRICLE TAPSE (M-mode): 2.1 cm LEFT ATRIUM           Index       RIGHT ATRIUM           Index LA diam:      2.90 cm 1.53 cm/m  RA Area:     13.60 cm LA Vol (A2C): 56.3 ml 29.61 ml/m RA Volume:   28.80 ml  15.15 ml/m LA Vol (A4C): 85.1 ml 44.76 ml/m  AORTIC VALVE LVOT Vmax:   92.50 cm/s LVOT Vmean:  67.000 cm/s LVOT VTI:    0.183 m AI PHT:      599 msec  AORTA Ao Root diam: 3.60 cm MITRAL VALVE                TRICUSPID VALVE MV Area (PHT): 2.95 cm     TR Peak grad:   24.4 mmHg MV Decel Time: 257 msec     TR Vmax:        247.00 cm/s MV E velocity: 51.40 cm/s MV A velocity: 109.00 cm/s  SHUNTS MV E/A ratio:  0.47         Systemic VTI:  0.18 m                             Systemic Diam: 2.00 cm Tobias Alexander MD Electronically signed by Tobias Alexander MD Signature Date/Time: 12/16/2020/4:26:09 PM    Final     Assessment/Plan 1. Metabolic encephalopathy Appears content to agree with everything I ask her say Needs MMSE  2. Small vessel disease, cerebrovascular Suspect underlying vascular dementia Appears her cognitive impairment is known in the [place] system  3. Cerebellar stroke (HCC) -Small, noted on MRI the hospital -Here for PT OT  4. Hypoglycemia -Noted and there was some question whether was due to her RA medication A1c is in prediabetic range  5. Hypertension secondary to other renal disorders -BP meds were not changed at the hospital blood pressure here  remains in normal range  6. Colonic stricture (HCC) -Tinea lactulose to help prevent obstruction  7. Chronic kidney disease, stage 3b (HCC) Avoid nephrotoxic agents like nsaids, dose adjust renally excreted meds, hydrate.   8. Weakness -Continue PT OT here  9. 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 Lyra Alaimo, D.O. Geriatrics [facility] [address], [place] [address] Cell Phone (Mon-Fri 8am-5pm):  [phone] On Call:  [phone] & follow prompts after 5pm & weekends Office Phone:  [phone] Office Fax:  [phone]

## 2020-12-26 IMAGING — CR LEFT TIBIA AND FIBULA - 2 VIEW
4 series · 4 of 4 positions shown · non-contrast
Comparison: None.

CLINICAL DATA: All over lower left leg pain. Pt states it has been
painful for months. States she was just in the hospital for colitis.
No hx of injury to the lower left leg.

EXAM:
LEFT TIBIA AND FIBULA - 2 VIEW

[tibia ap (1 of 2)]
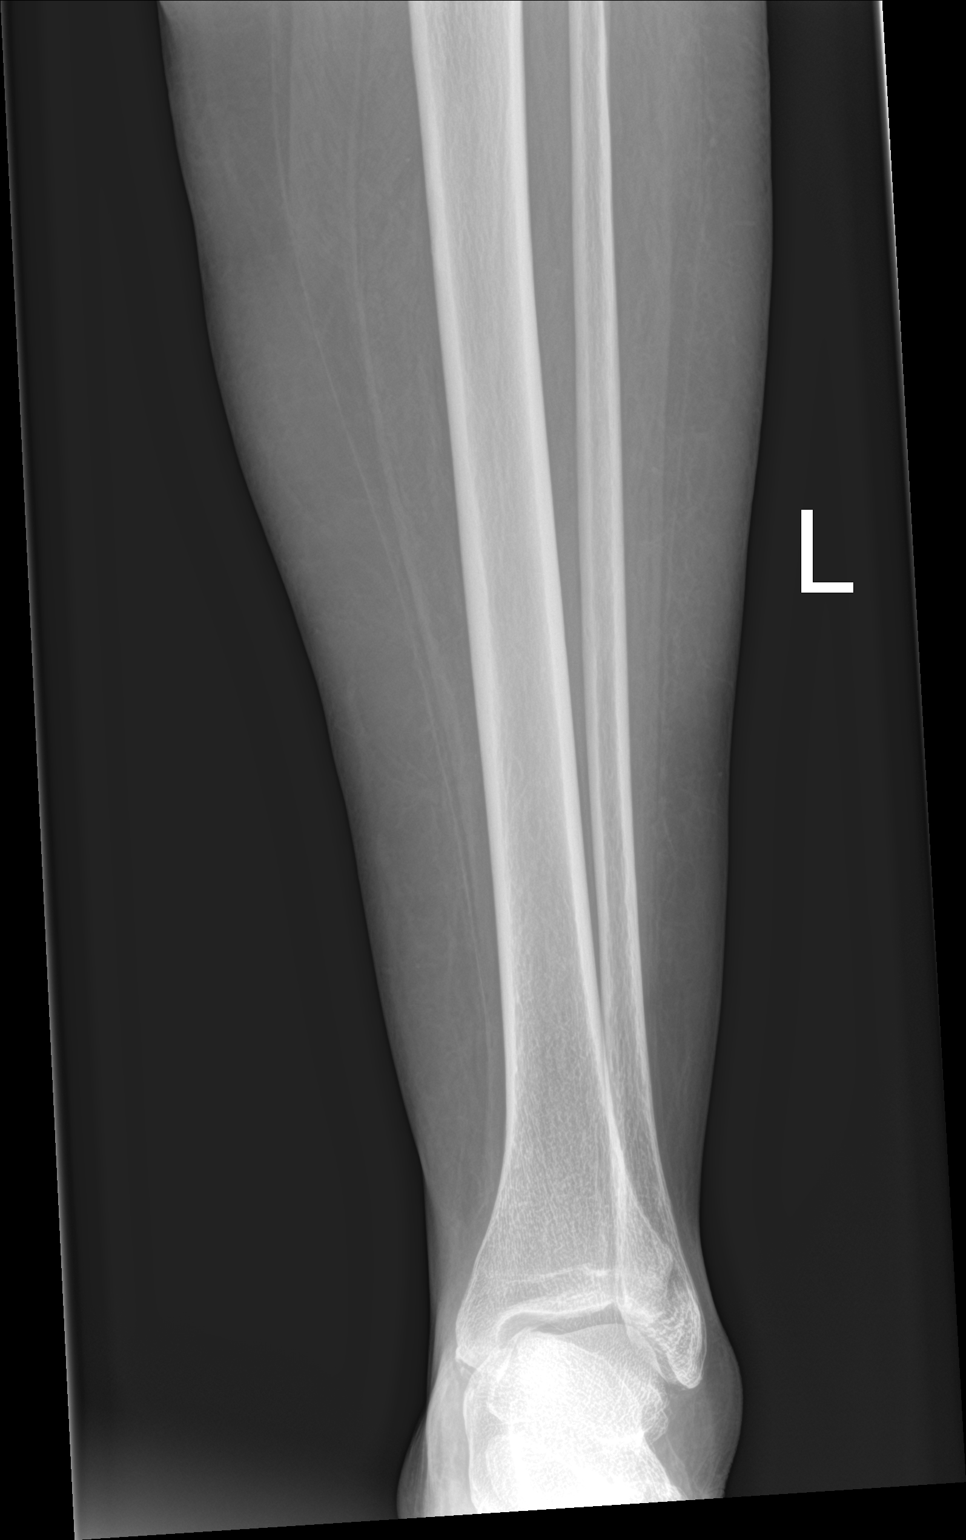

[tibia ap (2 of 2)]
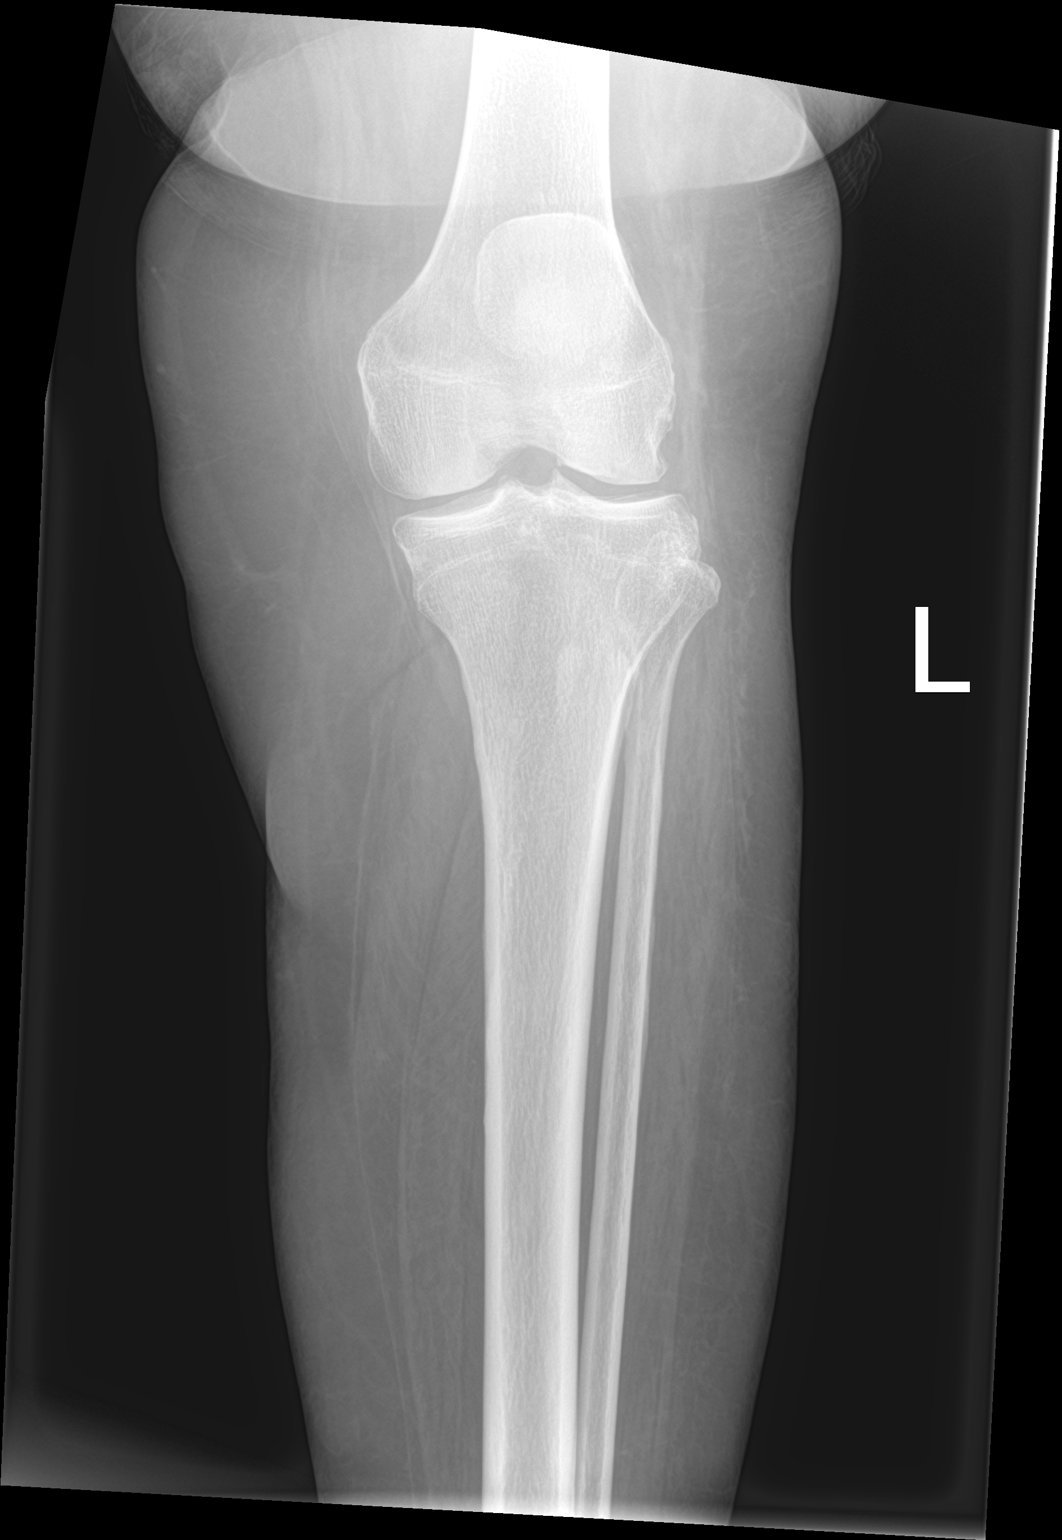

[tibia lat (1 of 2)]
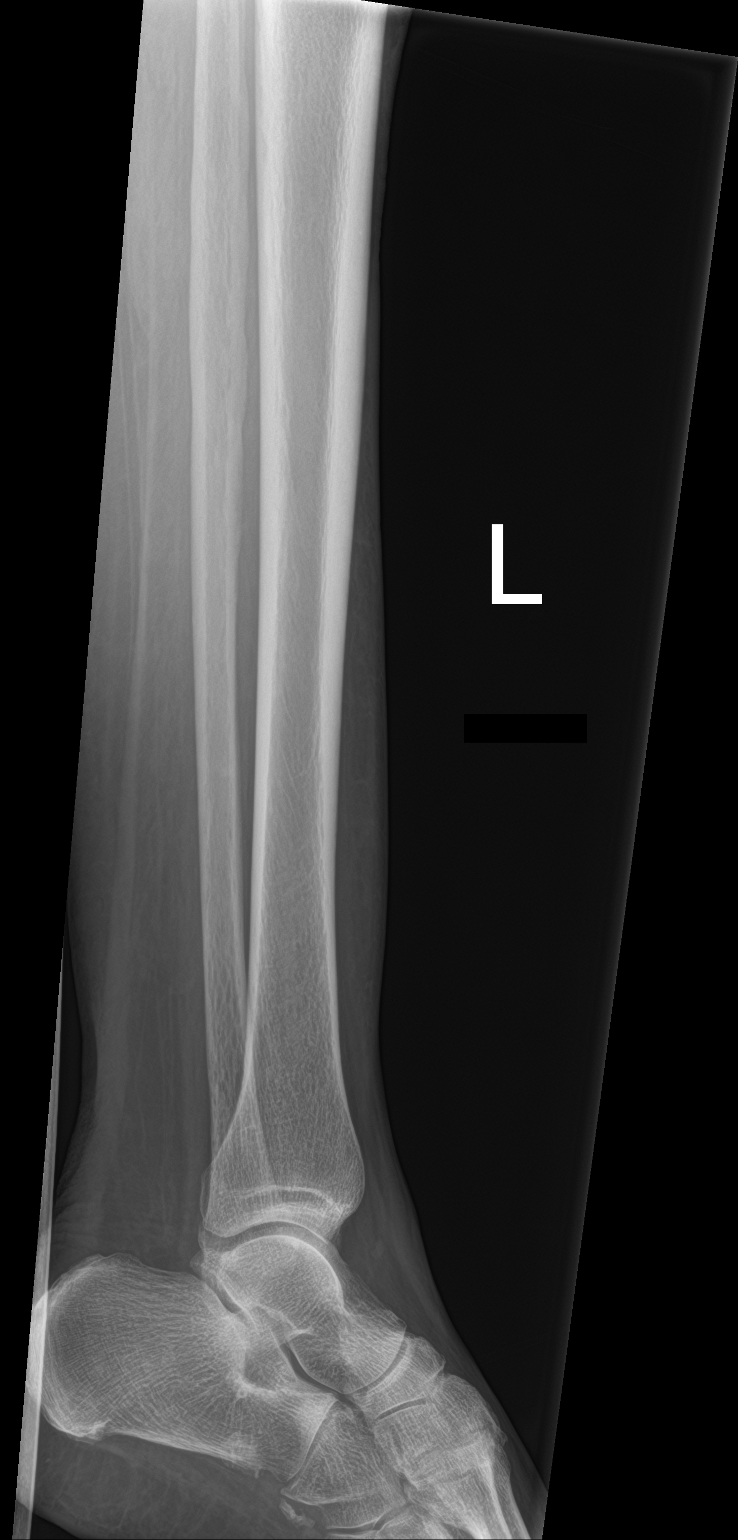

[tibia lat (2 of 2)]
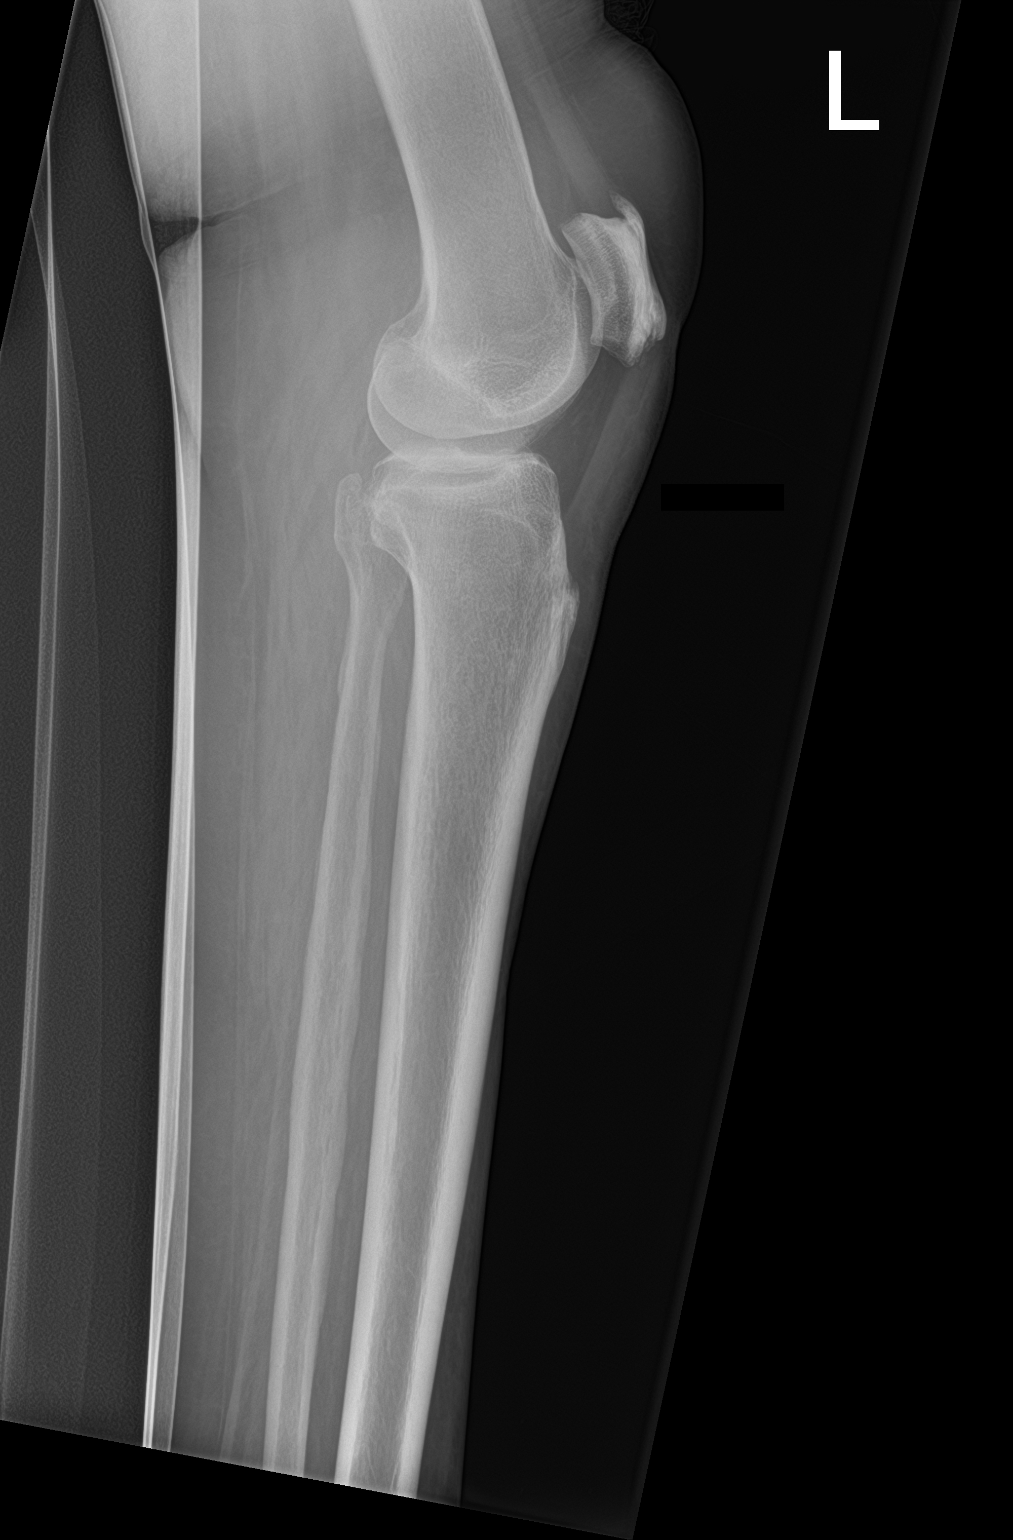

[4 of 4 positions shown; findings below may reference images not displayed]

FINDINGS: No fracture or bone lesion.

Knee and ankle joints are normally spaced and aligned.

Soft tissues are unremarkable.
IMPRESSION: Negative.

## 2020-12-27 IMAGING — DX ABDOMEN - 1 VIEW
1 series · 2 of 2 positions shown · non-contrast
Comparison: CT abdomen pelvis 03/25/2019

CLINICAL DATA: Abdominal pain with improvement.

EXAM:
ABDOMEN - 1 VIEW

[Series 1: abdomen · 0.14mm/px · 2 of 2 slices shown]
[im 1/2]
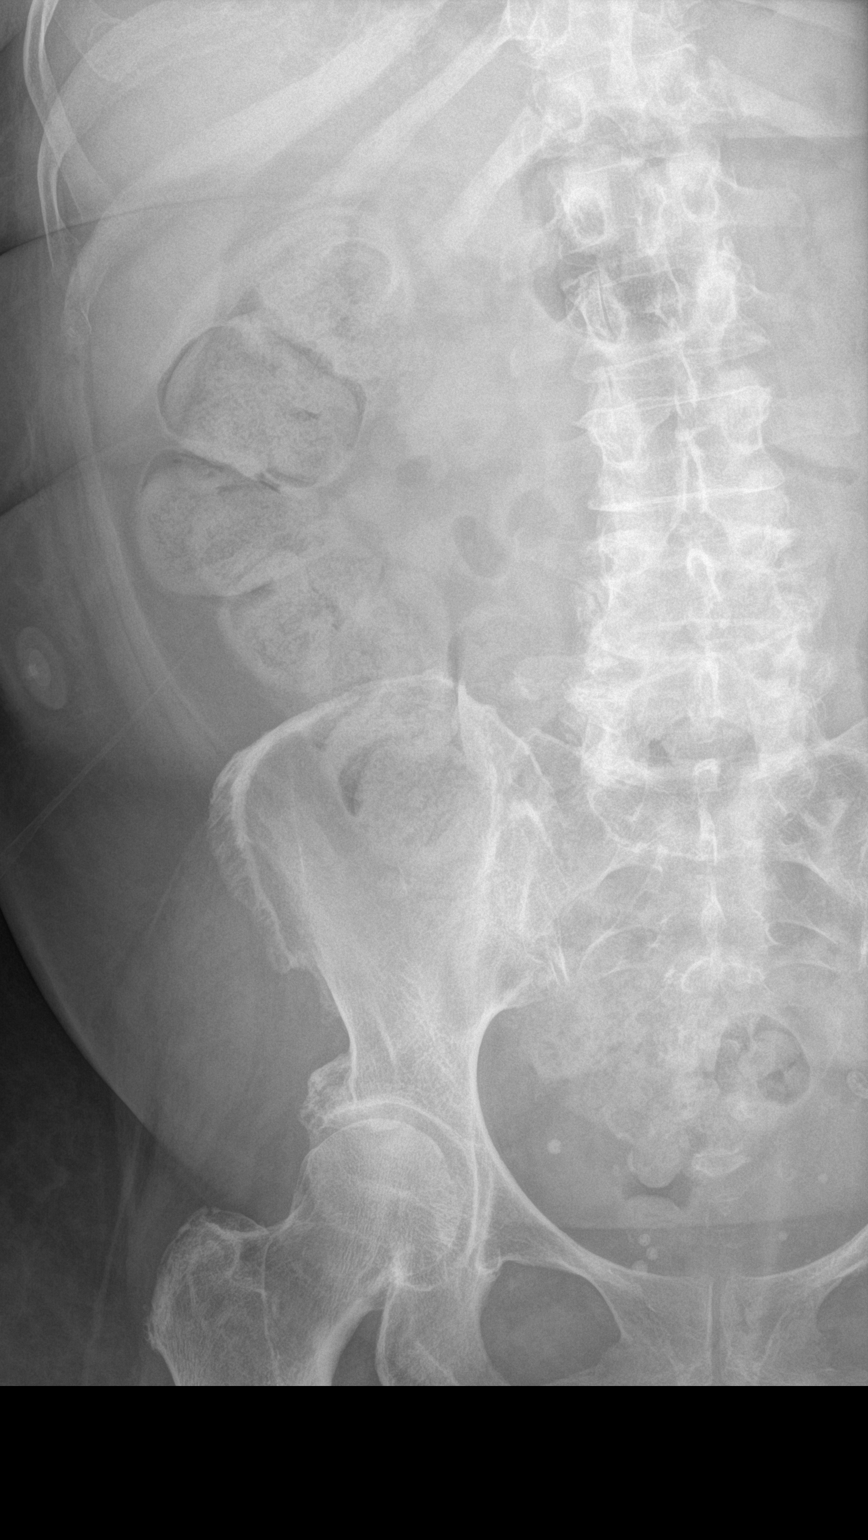
[im 2/2]
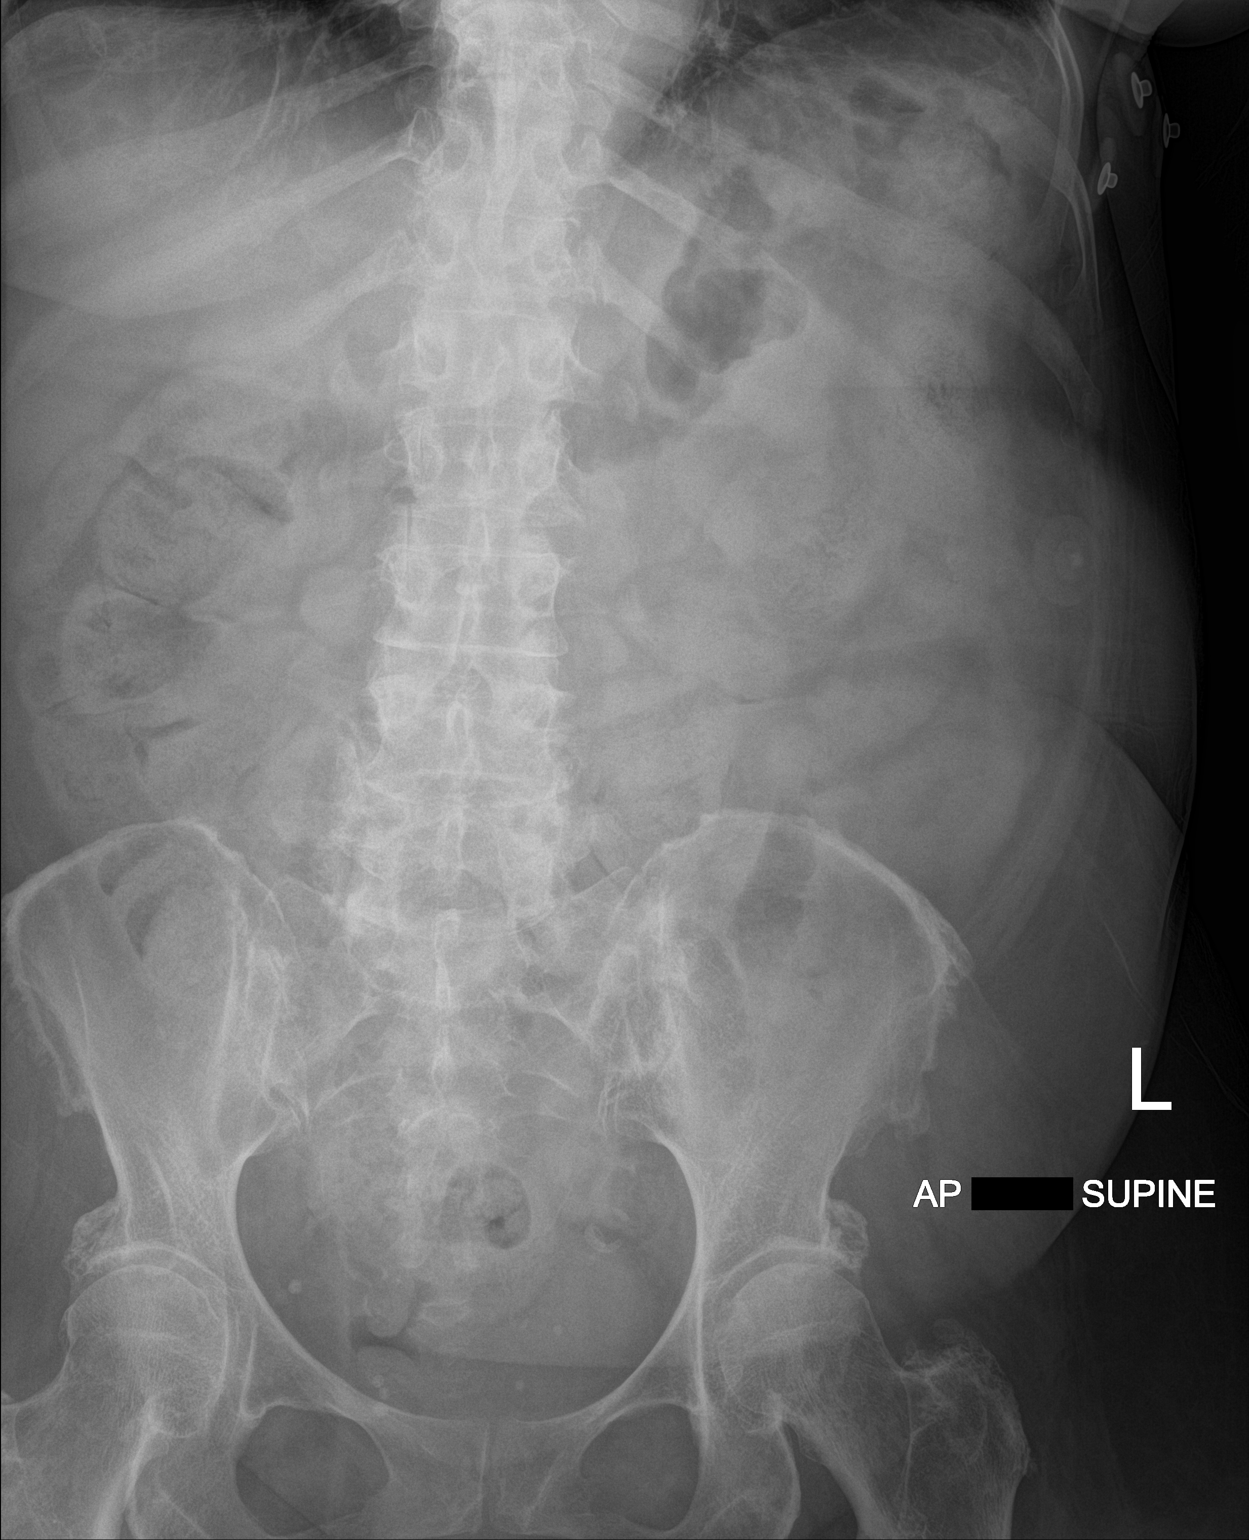

[2 of 2 positions shown; findings below may reference images not displayed]

FINDINGS: Relative paucity of small bowel gas. Large volume stool throughout
the colon. Pelvic phleboliths. Lumbar spine degenerative changes.
IMPRESSION: Large volume stool throughout the colon.

Paucity of small bowel gas.

## 2021-01-03 ENCOUNTER — Encounter: Payer: Self-pay | Admitting: Orthopedic Surgery

## 2021-01-03 ENCOUNTER — Non-Acute Institutional Stay (SKILLED_NURSING_FACILITY): Payer: Medicare Other | Admitting: Orthopedic Surgery

## 2021-01-03 ENCOUNTER — Other Ambulatory Visit: Payer: Self-pay | Admitting: Orthopedic Surgery

## 2021-01-03 DIAGNOSIS — G9341 Metabolic encephalopathy: Secondary | ICD-10-CM | POA: Diagnosis not present

## 2021-01-03 DIAGNOSIS — R350 Frequency of micturition: Secondary | ICD-10-CM

## 2021-01-03 DIAGNOSIS — K56699 Other intestinal obstruction unspecified as to partial versus complete obstruction: Secondary | ICD-10-CM

## 2021-01-03 DIAGNOSIS — I151 Hypertension secondary to other renal disorders: Secondary | ICD-10-CM

## 2021-01-03 DIAGNOSIS — I679 Cerebrovascular disease, unspecified: Secondary | ICD-10-CM | POA: Diagnosis not present

## 2021-01-03 DIAGNOSIS — F320 Major depressive disorder, single episode, mild: Secondary | ICD-10-CM

## 2021-01-03 DIAGNOSIS — M05749 Rheumatoid arthritis with rheumatoid factor of unspecified hand without organ or systems involvement: Secondary | ICD-10-CM

## 2021-01-03 DIAGNOSIS — I639 Cerebral infarction, unspecified: Secondary | ICD-10-CM

## 2021-01-03 DIAGNOSIS — I4891 Unspecified atrial fibrillation: Secondary | ICD-10-CM

## 2021-01-03 DIAGNOSIS — K219 Gastro-esophageal reflux disease without esophagitis: Secondary | ICD-10-CM

## 2021-01-03 DIAGNOSIS — E162 Hypoglycemia, unspecified: Secondary | ICD-10-CM | POA: Diagnosis not present

## 2021-01-03 DIAGNOSIS — R531 Weakness: Secondary | ICD-10-CM

## 2021-01-03 DIAGNOSIS — N2889 Other specified disorders of kidney and ureter: Secondary | ICD-10-CM

## 2021-01-03 DIAGNOSIS — N1832 Chronic kidney disease, stage 3b: Secondary | ICD-10-CM

## 2021-01-03 DIAGNOSIS — R3911 Hesitancy of micturition: Secondary | ICD-10-CM

## 2021-01-03 MED ORDER — ASPIRIN 81 MG PO TBEC: 81.0000 mg | DELAYED_RELEASE_TABLET | Freq: Every day | ORAL | 0 refills | Status: DC

## 2021-01-03 MED ORDER — ISOSORBIDE MONONITRATE ER 30 MG PO TB24: 30.0000 mg | ORAL_TABLET | Freq: Every day | ORAL | 0 refills | Status: DC

## 2021-01-03 MED ORDER — PANTOPRAZOLE SODIUM 40 MG PO TBEC: 40.0000 mg | DELAYED_RELEASE_TABLET | Freq: Every day | ORAL | 0 refills | Status: AC

## 2021-01-03 MED ORDER — TAMSULOSIN HCL 0.4 MG PO CAPS: 0.4000 mg | ORAL_CAPSULE | Freq: Every day | ORAL | 0 refills | Status: DC

## 2021-01-03 MED ORDER — DULOXETINE HCL 60 MG PO CPEP: 60.0000 mg | ORAL_CAPSULE | Freq: Every day | ORAL | 0 refills | Status: AC

## 2021-01-03 MED ORDER — QUETIAPINE FUMARATE 100 MG PO TABS: 100.0000 mg | ORAL_TABLET | Freq: Every day | ORAL | 0 refills | Status: DC

## 2021-01-03 MED ORDER — HYDROXYCHLOROQUINE SULFATE 200 MG PO TABS
200.0000 mg | ORAL_TABLET | Freq: Every day | ORAL | 0 refills | Status: AC
Start: 2021-01-03 — End: ?

## 2021-01-03 MED ORDER — MYRBETRIQ 25 MG PO TB24: 25.0000 mg | ORAL_TABLET | Freq: Every day | ORAL | 0 refills | Status: DC

## 2021-01-03 MED ORDER — DILTIAZEM HCL ER COATED BEADS 180 MG PO CP24: 180.0000 mg | ORAL_CAPSULE | Freq: Two times a day (BID) | ORAL | 0 refills | Status: DC

## 2021-01-03 MED ORDER — CARVEDILOL 25 MG PO TABS: 25.0000 mg | ORAL_TABLET | Freq: Two times a day (BID) | ORAL | 0 refills | Status: AC

## 2021-01-03 MED ORDER — PREDNISONE 5 MG PO TABS: 5.0000 mg | ORAL_TABLET | Freq: Every day | ORAL | 0 refills | Status: DC

## 2021-01-03 MED ORDER — ATORVASTATIN CALCIUM 40 MG PO TABS: 40.0000 mg | ORAL_TABLET | Freq: Every day | ORAL | 0 refills | Status: DC

## 2021-01-03 MED ORDER — CLONIDINE HCL 0.3 MG PO TABS: 0.3000 mg | ORAL_TABLET | Freq: Three times a day (TID) | ORAL | 0 refills | Status: DC

## 2021-01-03 MED ORDER — LACTULOSE 10 GM/15ML PO SOLN: 20.0000 g | Freq: Two times a day (BID) | ORAL | 0 refills | Status: DC

## 2021-01-03 NOTE — Progress Notes (Signed)
Location:    [facility] Room Number: 102/P Place of Service:  SNF (31)  Provider: Hazle Nordmann NP  PCP: Associates, [facility] Patient Care Team: [facility], [facility] as PCP - General (Family Medicine)  Extended Emergency Contact Information Primary Emergency Contact: McRae,Toni Address: [address]          [name] [address] [place] Home Phone: [phone] Relation: Daughter  Code Status: Full Code Goals of care:  Advanced Directive information Advanced Directives 01/03/2021  Does Patient Have a Medical Advance Directive? No  Would patient like information on creating a medical advance directive? No - Patient declined     No Known Allergies  Chief Complaint  Patient presents with  . Discharge Note    Discharge Visit     HPI:  70 y.o. female seen today for discharge evaluation.   She currently resides at [facility]. PMH includes: hypertension, a-fib, rheumatoid arthritis, chronic kidney disease stage III, acute ischemic stroke, small vessel disease, colitis, SIRS, depression and malnutrition.   She was recently hospitalized at [facility] 02/19- 02/23 for altered mental status, metabolic/hypertensive encephalopathy versus stroke/TIA. MRI brain showed advanced chronic small vessel disease with tiny acute infarction in the left cerebellum. CT abdomen revealed colonic stricture at hepatic flexure Polypharmacy also noted as cause for erratic behavior prior to hospitalization. During her stay, she was started on aspirin and lipitor , goal LDL < 70. Hypertension resolved during stay. Colonic stricture treated with aggressive laxatives. Advised to stay on lactulose. Discharged to [facility] for additional therapy services.   During her stay at [facility], she has received PT/OT and skilled nursing services. Today, she is alert to self and familiar faces. She knows she  is going home tomorrow, cannot state where she is or time. Follows commands and can express needs. Ambulating with walker > 156ft. Minimal assist with ADLs. Last bowel movement yesterday. Appetite fair. She denies chest pain and sob.   No recent falls or injuries.   Recent blood pressures are as follows:  03/10- 137/84  03/09- 118/59  03/08- 133/71  Current weight 190.8lbs  Facility nurse does not report any concerns, vitals stable.   At this time, she plans to discharge home 01/04/2021. Family will assist with transportation and care. Home health PT/OT have been ordered. No DME needs. I have advised her to follow up with PCP in 1-2 week.      Past Medical History:  Diagnosis Date  . Acute on chronic kidney failure (HCC)   . Anemia   . Arthritis   . Chest pain   . Diverticulitis   . Hypertension   . Stroke (HCC)   . UTI (urinary tract infection) 10/2019    Past Surgical History:  Procedure Laterality Date  . BIOPSY  04/01/2019   Procedure: BIOPSY;  Surgeon: Kathi Der, MD;  Location: [facility] ENDOSCOPY;  Service: Gastroenterology;;  . BIOPSY  10/05/2019   Procedure: BIOPSY;  Surgeon: Kathi Der, MD;  Location: [facility] ENDOSCOPY;  Service: Gastroenterology;;  . BIOPSY  05/10/2020   Procedure: BIOPSY;  Surgeon: Kathi Der, MD;  Location: [facility] ENDOSCOPY;  Service: Gastroenterology;;  . BIOPSY  11/29/2020   Procedure: BIOPSY;  Surgeon: Charlott Rakes, MD;  Location: [facility] ENDOSCOPY;  Service: Gastroenterology;;  . COLONOSCOPY N/A 11/29/2020   Procedure: COLONOSCOPY;  Surgeon: Charlott Rakes, MD;  Location: [facility] ENDOSCOPY;  Service: Gastroenterology;  Laterality: N/A;  . COLONOSCOPY  WITH PROPOFOL N/A 04/01/2019   Procedure: COLONOSCOPY WITH PROPOFOL;  Surgeon: Kathi Der, MD;  Location: [facility] ENDOSCOPY;  Service: Gastroenterology;  Laterality: N/A;  . COLONOSCOPY WITH PROPOFOL N/A 10/05/2019   Procedure: COLONOSCOPY WITH PROPOFOL;  Surgeon: Kathi Der, MD;  Location:  [facility] ENDOSCOPY;  Service: Gastroenterology;  Laterality: N/A;  . COLONOSCOPY WITH PROPOFOL N/A 05/10/2020   Procedure: COLONOSCOPY WITH PROPOFOL;  Surgeon: Kathi Der, MD;  Location: [facility] ENDOSCOPY;  Service: Gastroenterology;  Laterality: N/A;  . ESOPHAGOGASTRODUODENOSCOPY (EGD) WITH PROPOFOL N/A 04/01/2019   Procedure: ESOPHAGOGASTRODUODENOSCOPY (EGD) WITH PROPOFOL;  Surgeon: Kathi Der, MD;  Location: [facility] ENDOSCOPY;  Service: Gastroenterology;  Laterality: N/A;  . ESOPHAGOGASTRODUODENOSCOPY (EGD) WITH PROPOFOL N/A 10/05/2019   Procedure: ESOPHAGOGASTRODUODENOSCOPY (EGD) WITH PROPOFOL;  Surgeon: Kathi Der, MD;  Location: [facility] ENDOSCOPY;  Service: Gastroenterology;  Laterality: N/A;  . POLYPECTOMY  11/29/2020   Procedure: POLYPECTOMY;  Surgeon: Charlott Rakes, MD;  Location: [facility] ENDOSCOPY;  Service: Gastroenterology;;  . Sunnie Nielsen TATTOO INJECTION  11/29/2020   Procedure: SUBMUCOSAL TATTOO INJECTION;  Surgeon: Charlott Rakes, MD;  Location: [facility] ENDOSCOPY;  Service: Gastroenterology;;  . TONSILLECTOMY        reports that she has never smoked. She has never used smokeless tobacco. She reports previous alcohol use. She reports that she does not use drugs. Social History   Socioeconomic History  . Marital status: Divorced    Spouse name: Not on file  . Number of children: Not on file  . Years of education: Not on file  . Highest education level: Not on file  Occupational History  . Not on file  Tobacco Use  . Smoking status: Never Smoker  . Smokeless tobacco: Never Used  Vaping Use  . Vaping Use: Never used  Substance and Sexual Activity  . Alcohol use: Not Currently    Alcohol/week: 0.0 standard drinks    Comment: sometimes   . Drug use: No  . Sexual activity: Yes    Partners: Male    Birth control/protection: Condom  Other Topics Concern  . Not on file  Social History Narrative  . Not on file   Social Determinants of Health   Financial Resource Strain: Not on  file  Food Insecurity: Not on file  Transportation Needs: Not on file  Physical Activity: Not on file  Stress: Not on file  Social Connections: Not on file  Intimate Partner Violence: Not on file   Functional Status Survey:    No Known Allergies  Pertinent  Health Maintenance Due  Topic Date Due  . DEXA SCAN  Never done  . MAMMOGRAM  10/10/2021  . COLONOSCOPY (Pts 45-76yrs Insurance coverage will need to be confirmed)  11/29/2030  . INFLUENZA VACCINE  Completed  . PNA vac Low Risk Adult  Completed    Medications: Allergies as of 01/03/2021   No Known Allergies     Medication List       Accurate as of January 03, 2021 10:50 AM. If you have any questions, ask your nurse or doctor.        aspirin 81 MG EC tablet Take 81 mg by mouth daily.   atorvastatin 40 MG tablet Commonly known as: LIPITOR Take 1 tablet (40 mg total) by mouth daily.   carvedilol 25 MG tablet Commonly known as: COREG Take 25 mg by mouth 2 (two) times daily.   cloNIDine 0.3 MG tablet Commonly known as: CATAPRES Take 0.3 mg by mouth 3 (three) times daily.   diltiazem 180 MG 24 hr capsule  Commonly known as: CARDIZEM CD Take 180 mg by mouth 2 (two) times daily.   docusate sodium 100 MG capsule Commonly known as: COLACE Take 1 capsule (100 mg total) by mouth 2 (two) times daily.   DULoxetine 60 MG capsule Commonly known as: CYMBALTA Take 60 mg by mouth at bedtime.   Ensure Take 237 mLs by mouth daily in the afternoon. Vanilla   hydroxychloroquine 200 MG tablet Commonly known as: PLAQUENIL Take 200 mg by mouth daily.   isosorbide mononitrate 30 MG 24 hr tablet Commonly known as: IMDUR Take 30 mg by mouth daily.   lactulose 10 GM/15ML solution Commonly known as: CHRONULAC Take 30 mLs (20 g total) by mouth 2 (two) times daily.   multivitamin with minerals Tabs tablet Take 1 tablet by mouth daily.   Myrbetriq 25 MG Tb24 tablet Generic drug: mirabegron ER Take 1 tablet (25 mg total)  by mouth daily.   pantoprazole 40 MG tablet Commonly known as: PROTONIX Take 1 tablet (40 mg total) by mouth daily.   polyethylene glycol 17 g packet Commonly known as: MIRALAX / GLYCOLAX Take 17 g by mouth daily as needed for mild constipation.   predniSONE 5 MG tablet Commonly known as: DELTASONE Take 5 mg by mouth daily.   QUEtiapine 100 MG tablet Commonly known as: SEROQUEL Take 100 mg by mouth daily.   tamsulosin 0.4 MG Caps capsule Commonly known as: Flomax Take 1 capsule (0.4 mg total) by mouth daily after supper.       Review of Systems  Constitutional: Negative for activity change, appetite change and fever.  HENT: Negative for dental problem, hearing loss and trouble swallowing.   Eyes: Negative for visual disturbance.  Respiratory: Negative for cough, shortness of breath and wheezing.   Cardiovascular: Negative for chest pain and leg swelling.  Gastrointestinal: Negative for abdominal distention, abdominal pain, constipation, diarrhea and nausea.  Genitourinary: Positive for frequency. Negative for dysuria and hematuria.  Musculoskeletal: Positive for arthralgias and myalgias.  Skin:       Dry skin  Neurological: Positive for weakness. Negative for dizziness and headaches.  Psychiatric/Behavioral: Positive for confusion. Negative for dysphoric mood and sleep disturbance. The patient is not nervous/anxious.     Vitals:   01/03/21 1037  BP: 137/84  Pulse: 74  Resp: 18  Temp: 98.3 F (36.8 C)  SpO2: 98%  Weight: 190 lb 12.8 oz (86.5 kg)  Height: 5\' 7"  (1.702 m)   Body mass index is 29.88 kg/m. Physical Exam Vitals reviewed.  Constitutional:      General: She is not in acute distress.    Appearance: Normal appearance.  HENT:     Head: Normocephalic.     Right Ear: There is no impacted cerumen.     Left Ear: There is no impacted cerumen.     Nose: Nose normal.     Mouth/Throat:     Mouth: Mucous membranes are moist.  Eyes:     General:         Right eye: No discharge.        Left eye: No discharge.  Cardiovascular:     Rate and Rhythm: Normal rate and regular rhythm.     Pulses: Normal pulses.     Heart sounds: Normal heart sounds. No murmur heard.   Pulmonary:     Effort: Pulmonary effort is normal. No respiratory distress.     Breath sounds: Normal breath sounds. No wheezing.  Abdominal:     General: Bowel sounds  are normal. There is no distension.     Palpations: Abdomen is soft.     Tenderness: There is no abdominal tenderness.  Musculoskeletal:     Cervical back: Normal range of motion.     Right lower leg: No edema.     Left lower leg: Edema present.     Comments: Non-pitting  Lymphadenopathy:     Cervical: No cervical adenopathy.  Skin:    General: Skin is warm and dry.     Capillary Refill: Capillary refill takes less than 2 seconds.  Neurological:     General: No focal deficit present.     Mental Status: She is alert. Mental status is at baseline.     Motor: Weakness present.     Gait: Gait abnormal.     Comments: walker  Psychiatric:        Mood and Affect: Mood normal.        Behavior: Behavior normal.        Cognition and Memory: Memory is impaired.     Labs reviewed: Basic Metabolic Panel: Recent Labs    12/11/20 0755 12/15/20 1150 12/17/20 0253 12/18/20 0245 12/19/20 0635  NA 139   < > 139 138 139  K 4.7   < > 3.6 3.9 4.5  CL 113*   < > 108 105 104  CO2 18*   < > 23 24 24   GLUCOSE 117*   < > 108* 95 101*  BUN 33*   < > 10 11 18   CREATININE 1.81*   < > 1.57* 1.59* 1.82*  CALCIUM 8.4*   < > 8.5* 8.9 8.8*  MG 1.4*  --  1.4* 1.9  --   PHOS  --   --  3.3  --   --    < > = values in this interval not displayed.   Liver Function Tests: Recent Labs    12/17/20 0253 12/18/20 0245 12/19/20 0635  AST 14* 14* 14*  ALT 12 12 12   ALKPHOS 52 64 75  BILITOT 0.6 0.6 0.4  PROT 5.6* 5.7* 5.8*  ALBUMIN 2.8* 2.9* 2.8*   Recent Labs    05/06/20 0814 11/22/20 0347 11/26/20 1035  LIPASE 26  23 25    No results for input(s): AMMONIA in the last 8760 hours. CBC: Recent Labs    12/10/20 0747 12/11/20 0755 12/15/20 1150 12/15/20 2334 12/17/20 0253  WBC 8.3 8.3 9.5 8.3 7.0  NEUTROABS 5.0 4.3  --   --  3.5  HGB 9.5* 9.2* 11.2* 10.9* 10.2*  HCT 30.0* 30.1* 35.6* 32.6* 30.2*  MCV 96.2 97.7 97.3 93.4 94.7  PLT 163 181 215 209 181   Cardiac Enzymes: No results for input(s): CKTOTAL, CKMB, CKMBINDEX, TROPONINI in the last 8760 hours. BNP: Invalid input(s): POCBNP CBG: Recent Labs    12/18/20 1643 12/18/20 2013 12/19/20 0741  GLUCAP 92 124* 102*    Procedures and Imaging Studies During Stay: CT ABDOMEN PELVIS WO CONTRAST  Result Date: 12/15/2020 CLINICAL DATA:  Bowel obstruction suspected.  Abdominal pain. EXAM: CT ABDOMEN AND PELVIS WITHOUT CONTRAST TECHNIQUE: Multidetector CT imaging of the abdomen and pelvis was performed following the standard protocol without IV contrast. COMPARISON:  CT dated November 26, 2020. FINDINGS: Lower chest: There is chronic scarring at the lung bases. There are trace bilateral pleural effusions.The heart is enlarged. Hepatobiliary: The liver is normal. Normal gallbladder.There is no biliary ductal dilation. Pancreas: Normal contours without ductal dilatation. No peripancreatic fluid collection. Spleen: Unremarkable. Adrenals/Urinary Tract: --Adrenal  glands: Unremarkable. --Right kidney/ureter: No hydronephrosis or radiopaque kidney stones. --Left kidney/ureter: No hydronephrosis or radiopaque kidney stones. --Urinary bladder: Unremarkable. Stomach/Bowel: --Stomach/Duodenum: No hiatal hernia or other gastric abnormality. Normal duodenal course and caliber. --Small bowel: Unremarkable. --Colon: Rectosigmoid diverticulosis without acute inflammation. Again noted is significant narrowing at the hepatic flexure, chronic across prior studies. There is a large amount of stool in the transverse colon and right hemicolon. --Appendix: Normal.  Vascular/Lymphatic: Atherosclerotic calcification is present within the non-aneurysmal abdominal aorta, without hemodynamically significant stenosis. --No retroperitoneal lymphadenopathy. --No mesenteric lymphadenopathy. --No pelvic or inguinal lymphadenopathy. Reproductive: Unremarkable Other: No ascites or free air. The abdominal wall is normal. Musculoskeletal. A chronic appearing compression fracture is noted of the L5 vertebral body. IMPRESSION: 1. No acute abdominopelvic abnormality. 2. Again noted is significant narrowing at the hepatic flexure of the colon, chronic across prior studies. 3. Large amount of stool in the transverse colon and right hemicolon. 4. Rectosigmoid diverticulosis without acute inflammation. 5. Trace bilateral pleural effusions with chronic scarring at the lung bases. Aortic Atherosclerosis (ICD10-I70.0). Electronically Signed   By: Katherine Mantle M.D.   On: 12/15/2020 16:31   CT Head Wo Contrast  Result Date: 12/15/2020 CLINICAL DATA:  Mental status changes, altered mental status, abdominal pain, recent small-bowel obstruction just released from hospital 4 days ago EXAM: CT HEAD WITHOUT CONTRAST TECHNIQUE: Contiguous axial images were obtained from the base of the skull through the vertex without intravenous contrast. COMPARISON:  06/16/2019 FINDINGS: Brain: Generalized atrophy. Cavum septum pellucidum. Otherwise normal ventricular morphology. No midline shift or mass effect. Small vessel chronic ischemic changes of deep cerebral white matter. No intracranial hemorrhage, mass lesion, evidence of acute infarction, or extra-axial fluid collection. Vascular: Atherosclerotic calcifications of internal carotid arteries at skull base Skull: Intact Sinuses/Orbits: Clear Other: N/A IMPRESSION: Atrophy with small vessel chronic ischemic changes of deep cerebral white matter. No acute intracranial abnormalities. Electronically Signed   By: Ulyses Southward M.D.   On: 12/15/2020 16:32   MR  BRAIN WO CONTRAST  Result Date: 12/15/2020 CLINICAL DATA:  Mental status changes of unknown cause. EXAM: MRI HEAD WITHOUT CONTRAST TECHNIQUE: Multiplanar, multiecho pulse sequences of the brain and surrounding structures were obtained without intravenous contrast. COMPARISON:  Head CT same day FINDINGS: Brain: Diffusion imaging shows a tiny acute infarction within the left cerebellum. No other acute finding. Mild chronic small-vessel ischemic changes of the pons. Few old small vessel cerebellar infarctions, including 1 with hemosiderin deposition on the right. Cerebral hemispheres show old infarction in the right basal ganglia and radiating white matter tracts. Old small vessel thalamic infarctions. There is confluent chronic small vessel disease throughout the hemispheric white matter. No large vessel territory infarction. No mass lesion, hydrocephalus or extra-axial collection. Numerous foci of hemosiderin deposition associated with the old small vessel infarctions. Vascular: Major vessels at the base of the brain show flow. 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 Hemosiderin deposition associated with many of the old small vessel infarctions. There appears to be a tiny acute infarction in the left lateral cerebellum. It would be surprising if this were the cause of the clinical presentation, as one might expect this to be subclinical. Electronically Signed   By: Paulina Fusi M.D.   On: 12/15/2020 21:35   US RENAL  Result Date: 12/08/2020 CLINICAL DATA:  Renal failure. EXAM: RENAL / URINARY TRACT ULTRASOUND COMPLETE COMPARISON:  None. FINDINGS: Right Kidney: Renal  measurements: 8.5 x 4.5 x 4.7 cm = volume: 92.77 mL. Increased parenchymal echogenicity. Cortical thinning with some lobulation of the contour. No mass. No stone or hydronephrosis. Left Kidney: Renal measurements:  8.2 x 4.5 x 4.2 cm = volume: 82.1 mL. Increased parenchymal echogenicity. Renal cortical thinning. Lobulated contour. No mass, stone or hydronephrosis. Bladder: Distended.  Otherwise unremarkable. Other: None. IMPRESSION: 1. Findings consistent with medical renal disease with reduced renal sizes, increased renal parenchymal echogenicity and renal cortical thinning. No masses, stones or hydronephrosis. 2. Distended bladder. Electronically Signed   By: Amie Portland M.D.   On: 12/08/2020 17:07   DG Chest Portable 1 View  Result Date: 12/15/2020 CLINICAL DATA:  Altered mental status, oriented to name only, unable to follow commands, discharge from hospital on Tuesday for small-bowel obstruction, last seen normal yesterday EXAM: PORTABLE CHEST 1 VIEW COMPARISON:  Portable exam 2000 hours compared to 11/22/2020 FINDINGS: Enlargement of cardiac silhouette. Mediastinal contours and pulmonary vascularity normal. Minimal LEFT base atelectasis. Lungs otherwise clear. No acute infiltrate, pleural effusion or pneumothorax. Bones demineralized. IMPRESSION: Enlargement of cardiac silhouette with minimal LEFT basilar atelectasis. Electronically Signed   By: Ulyses Southward M.D.   On: 12/15/2020 20:09   DG Abdomen Acute W/Chest  Result Date: 12/08/2020 CLINICAL DATA:  Constipation. EXAM: DG ABDOMEN ACUTE WITH 1 VIEW CHEST COMPARISON:  CT scan 11/26/2020 FINDINGS: The upright chest x-ray demonstrates normal heart size and mild tortuosity of the thoracic aorta. No acute pulmonary findings. No pleural effusion. No pulmonary lesions. Two views of the abdomen demonstrate the moderate amount of stool in the right and proximal transverse colon. I do not see any significant stool in the left colon, sigmoid colon or rectum. No distended small bowel loops to suggest obstruction. The soft tissue shadows are grossly maintained. No worrisome calcifications. No significant bony findings. IMPRESSION: 1. No acute cardiopulmonary findings. 2.  Moderate stool in the right and proximal transverse colon. Electronically Signed   By: Rudie Meyer M.D.   On: 12/08/2020 16:57   ECHOCARDIOGRAM COMPLETE  Result Date: 12/16/2020    ECHOCARDIOGRAM REPORT   Patient Name:   MARGRET MOAT Date of Exam: 12/16/2020 Medical Rec #:  [id number]         Height:       67.0 in Accession #:    [id number]        Weight:       173.0 lb Date of Birth:  01-Apr-1951          BSA:          1.901 m Patient Age:    70 years          BP:           138/95 mmHg Patient Gender: F                 HR:           75 bpm. Exam Location:  Inpatient Procedure: 2D Echo, Cardiac Doppler and Color Doppler Indications:    TIA 435.9 / G45.9  History:        Patient has prior history of Echocardiogram examinations, most                 recent 05/10/2020. Stroke, Arrythmias:Atrial Fibrillation; Risk                 Factors:Hypertension. Acute on chronic renal failure.  Sonographer:    Celesta Gentile RCS Referring Phys: [id number] Alessandra Bevels IMPRESSIONS  1.  Left ventricular ejection fraction, by estimation, is 55 to 60%. The left ventricle has normal function. The left ventricle has no regional wall motion abnormalities. There is moderate concentric left ventricular hypertrophy. Left ventricular diastolic parameters are consistent with Grade I diastolic dysfunction (impaired relaxation). Elevated left atrial pressure.  2. Right ventricular systolic function is normal. The right ventricular size is normal. There is normal pulmonary artery systolic pressure. The estimated right ventricular systolic pressure is 27.4 mmHg.  3. Left atrial size was mildly dilated.  4. The mitral valve is normal in structure. Mild mitral valve regurgitation. No evidence of mitral stenosis.  5. The aortic valve is normal in structure. Aortic valve regurgitation is mild. Mild to moderate aortic valve sclerosis/calcification is present, without any evidence of aortic stenosis.  6. The inferior vena cava is normal in size  with greater than 50% respiratory variability, suggesting right atrial pressure of 3 mmHg. FINDINGS  Left Ventricle: Left ventricular ejection fraction, by estimation, is 55 to 60%. The left ventricle has normal function. The left ventricle has no regional wall motion abnormalities. The left ventricular internal cavity size was normal in size. There is  moderate concentric left ventricular hypertrophy. Left ventricular diastolic parameters are consistent with Grade I diastolic dysfunction (impaired relaxation). Elevated left atrial pressure. Right Ventricle: The right ventricular size is normal. No increase in right ventricular wall thickness. Right ventricular systolic function is normal. There is normal pulmonary artery systolic pressure. The tricuspid regurgitant velocity is 2.47 m/s, and  with an assumed right atrial pressure of 3 mmHg, the estimated right ventricular systolic pressure is 27.4 mmHg. Left Atrium: Left atrial size was mildly dilated. Right Atrium: Right atrial size was normal in size. Pericardium: There is no evidence of pericardial effusion. Mitral Valve: The mitral valve is normal in structure. Mild mitral valve regurgitation. No evidence of mitral valve stenosis. Tricuspid Valve: The tricuspid valve is normal in structure. Tricuspid valve regurgitation is not demonstrated. No evidence of tricuspid stenosis. Aortic Valve: The aortic valve is normal in structure. Aortic valve regurgitation is mild. Aortic regurgitation PHT measures 599 msec. Mild to moderate aortic valve sclerosis/calcification is present, without any evidence of aortic stenosis. Pulmonic Valve: The pulmonic valve was normal in structure. Pulmonic valve regurgitation is not visualized. No evidence of pulmonic stenosis. Aorta: The aortic root is normal in size and structure. Venous: The inferior vena cava is normal in size with greater than 50% respiratory variability, suggesting right atrial pressure of 3 mmHg. IAS/Shunts: No  atrial level shunt detected by color flow Doppler.  LEFT VENTRICLE PLAX 2D LVIDd:         3.90 cm  Diastology LVIDs:         2.80 cm  LV e' medial:    3.59 cm/s LV PW:         1.80 cm  LV E/e' medial:  14.3 LV IVS:        1.50 cm  LV e' lateral:   4.57 cm/s LVOT diam:     2.00 cm  LV E/e' lateral: 11.2 LV SV:         57 LV SV Index:   30 LVOT Area:     3.14 cm  RIGHT VENTRICLE TAPSE (M-mode): 2.1 cm LEFT ATRIUM           Index       RIGHT ATRIUM           Index LA diam:      2.90 cm 1.53 cm/m  RA Area:     13.60 cm LA Vol (A2C): 56.3 ml 29.61 ml/m RA Volume:   28.80 ml  15.15 ml/m LA Vol (A4C): 85.1 ml 44.76 ml/m  AORTIC VALVE LVOT Vmax:   92.50 cm/s LVOT Vmean:  67.000 cm/s LVOT VTI:    0.183 m AI PHT:      599 msec  AORTA Ao Root diam: 3.60 cm MITRAL VALVE                TRICUSPID VALVE MV Area (PHT): 2.95 cm     TR Peak grad:   24.4 mmHg MV Decel Time: 257 msec     TR Vmax:        247.00 cm/s MV E velocity: 51.40 cm/s MV A velocity: 109.00 cm/s  SHUNTS MV E/A ratio:  0.47         Systemic VTI:  0.18 m                             Systemic Diam: 2.00 cm Tobias Alexander MD Electronically signed by Tobias Alexander MD Signature Date/Time: 12/16/2020/4:26:09 PM    Final     Assessment/Plan:   1. Metabolic encephalopathy - very pleasant, alert to self and familiar face - recommend MMSE - bmp- future  2. 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 Cerebellar stroke (HCC) - small, noted on MRI during hospitalization - cont home health PT/OT  4. Hypoglycemia - no reports of hypoglycemia during stay - last A1C prediabetic  5. Hypertension secondary to other renal disorders - bp at goal < 150/90 - cont coreg and catapres - cbc/diff- future  6. Colonic stricture (HCC) - cont lactulose to prevent obstruction  7. Chronic kidney disease, stage 3b (HCC) - continue to avoid nephrotoxic drugs like NSAIDS and dose  adjust medications to be renally excreted  8. Weakness - improved during stay, ambulating > 100 ft with walker - cont falls prevention plan at home  9. 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  Social services at facility was responsible for arranging this appointment.  Pt was provided with a 30 day supply of prescriptions for medications and refills must be obtained from their PCP.  For controlled substances, a more limited supply may be provided adequate until PCP appointment only.  Future labs/tests needed:  Cbc/diff, bmp

## 2021-01-04 ENCOUNTER — Other Ambulatory Visit: Payer: Self-pay | Admitting: Orthopedic Surgery

## 2021-01-04 DIAGNOSIS — R3911 Hesitancy of micturition: Secondary | ICD-10-CM

## 2021-01-05 ENCOUNTER — Other Ambulatory Visit: Payer: Self-pay | Admitting: Orthopedic Surgery

## 2021-01-05 DIAGNOSIS — I639 Cerebral infarction, unspecified: Secondary | ICD-10-CM

## 2021-01-05 DIAGNOSIS — N2889 Other specified disorders of kidney and ureter: Secondary | ICD-10-CM

## 2021-01-05 DIAGNOSIS — I151 Hypertension secondary to other renal disorders: Secondary | ICD-10-CM

## 2021-01-14 IMAGING — CR PORTABLE CHEST - 1 VIEW
1 series · 1 of 1 positions shown · non-contrast
Comparison: Radiograph 05/13/2019, CT chest March 26, 2019

CLINICAL DATA: Weakness for 1 day, history of stroke and
hypertension, nonsmoker

EXAM:
PORTABLE CHEST 1 VIEW

[chest ap]
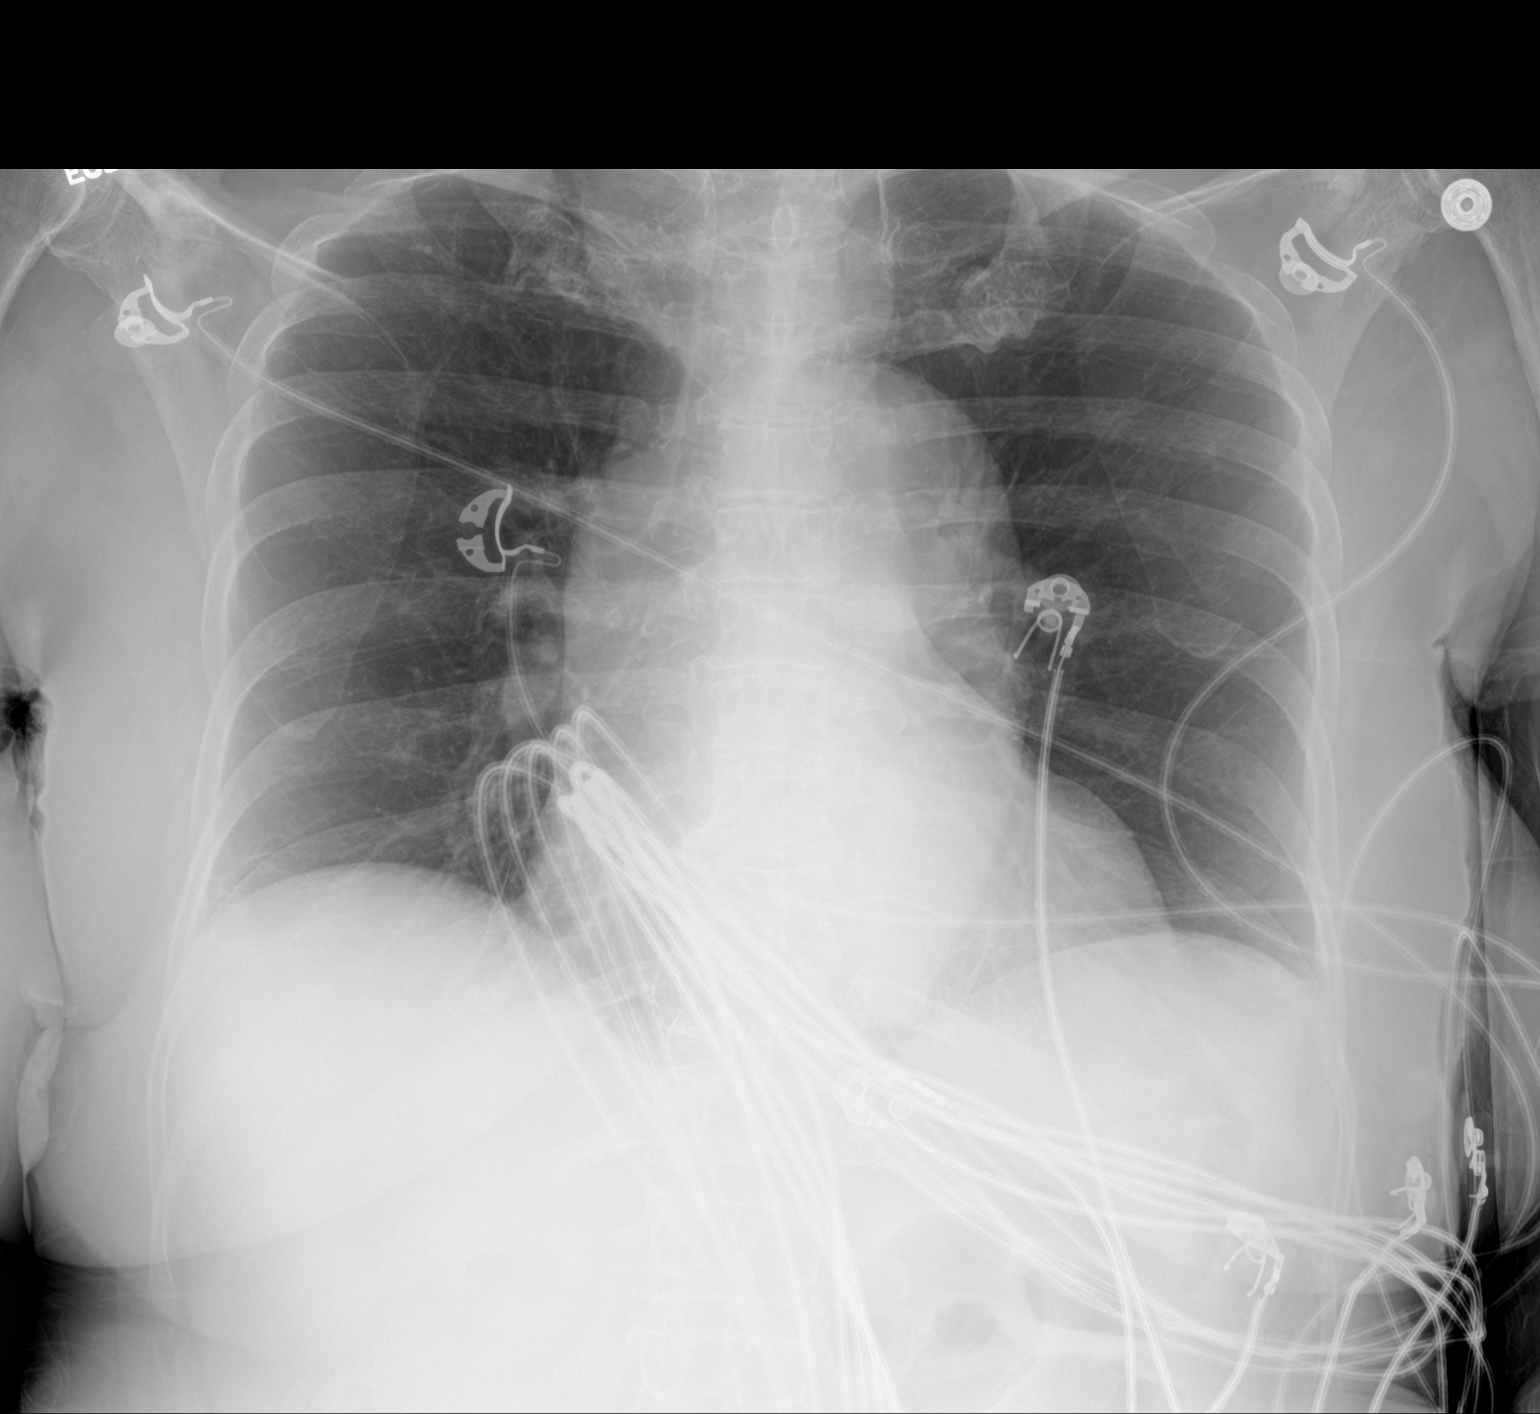

[1 of 1 positions shown; findings below may reference images not displayed]

FINDINGS: Cardiomegaly and aortic tortuosity are similar to prior. The lungs
are clear. No pneumothorax. Trace left effusion. No right effusion.
No acute osseous or soft tissue abnormality
IMPRESSION: Trace residual left effusion versus scarring, otherwise no acute
cardiopulmonary abnormality.

Stable cardiomediastinal contours.

## 2021-01-14 IMAGING — CT CT HEAD WITHOUT CONTRAST
4 series · 15 of 47 positions shown, 17 images · non-contrast
Comparison: CT head dated May 13, 2019

CLINICAL DATA: Altered level of consciousness

EXAM:
CT HEAD WITHOUT CONTRAST
TECHNIQUE: Contiguous axial images were obtained from the base of the skull
through the vertex without intravenous contrast.

[Series 3: head without · axial · non-contrast · 0.42mm/px · z∈[-99,+21]mm · 7 of 34 slices shown, 9 images]
[im 5/34  brain]
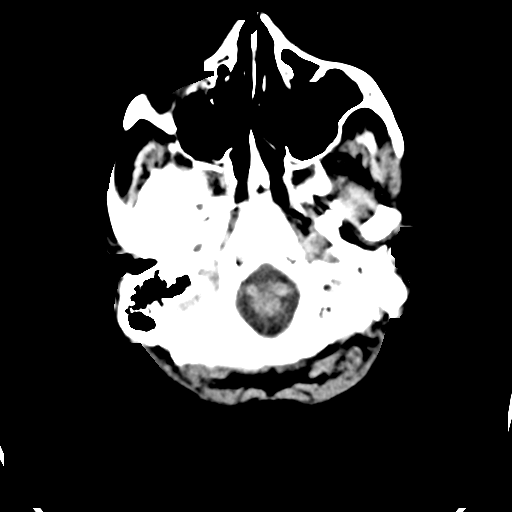
[im 5/34  bone]
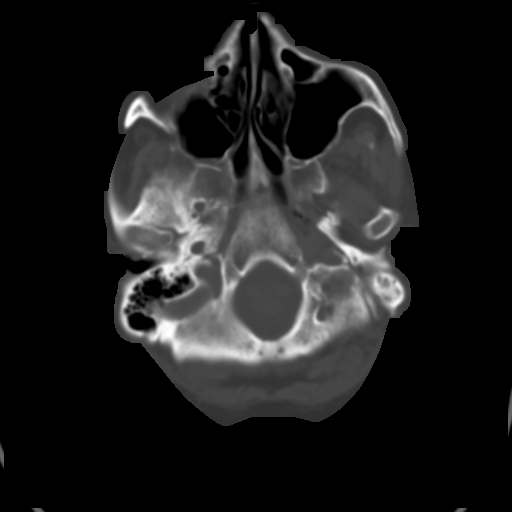
[im 9/34  brain]
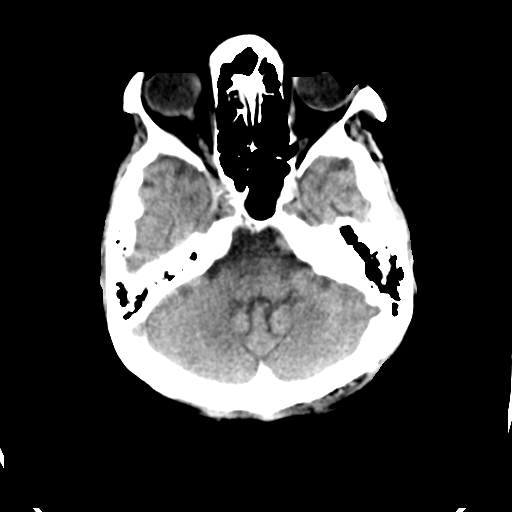
[im 13/34  brain]
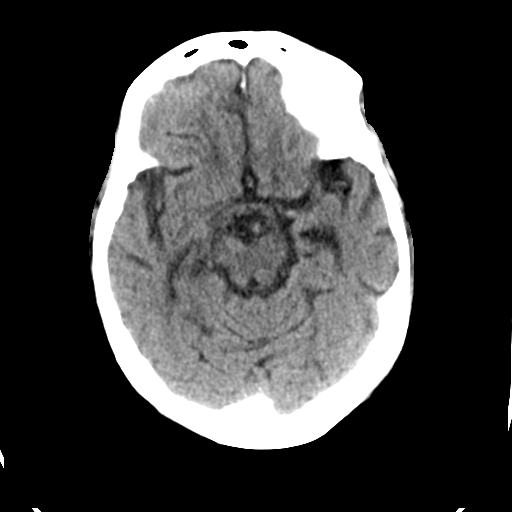
[im 17/34  brain]
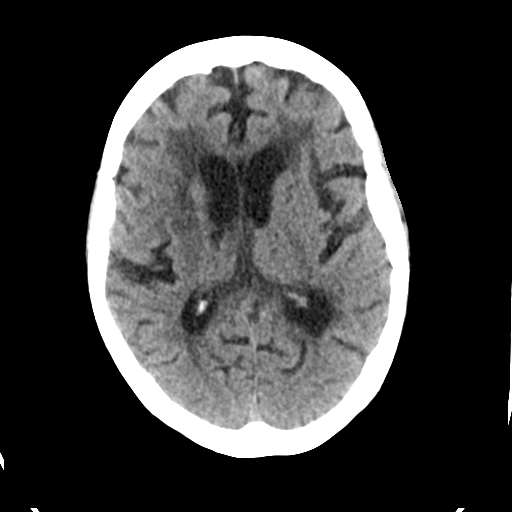
[im 21/34  brain]
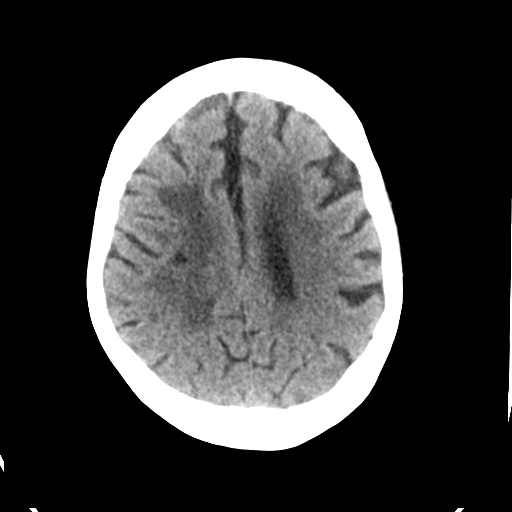
[im 21/34  bone]
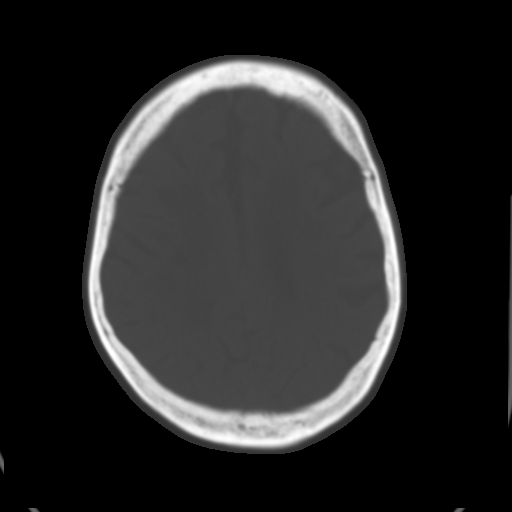
[im 25/34  brain]
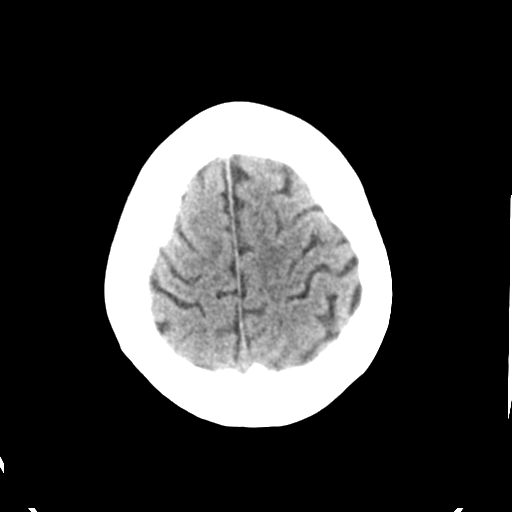
[im 29/34  brain]
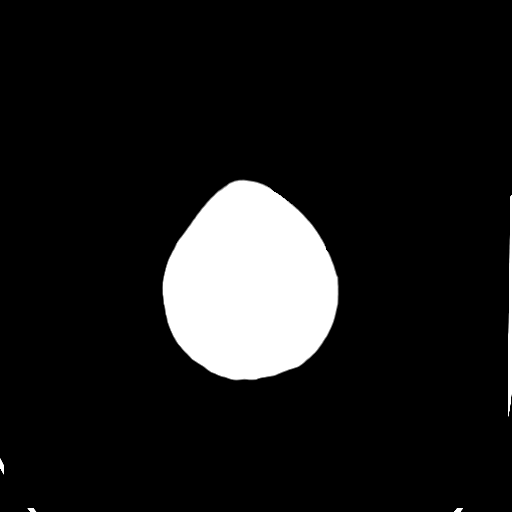

[Series 4: head bone · axial · 0.42mm/px · z∈[-103,-87]mm · 2 of 84 slices shown]
[im 9/84  bone]
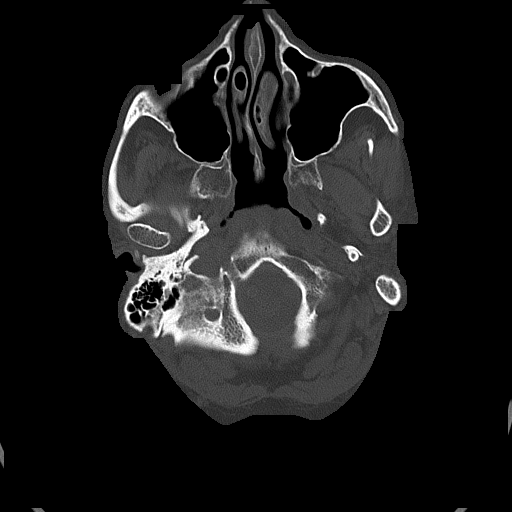
[im 17/84  bone]
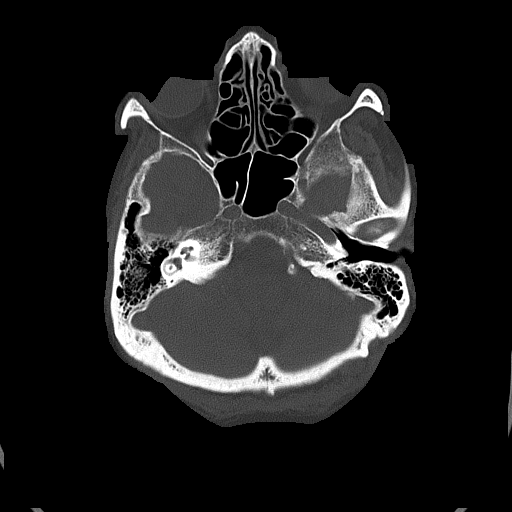

[Series 5: head without cor · coronal · non-contrast · 0.40mm/px · 3 of 70 slices shown]
[im 24/70  brain]
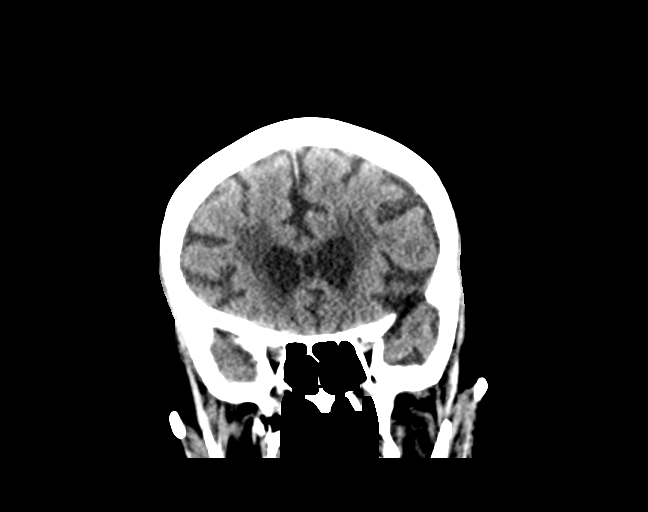
[im 31/70  brain]
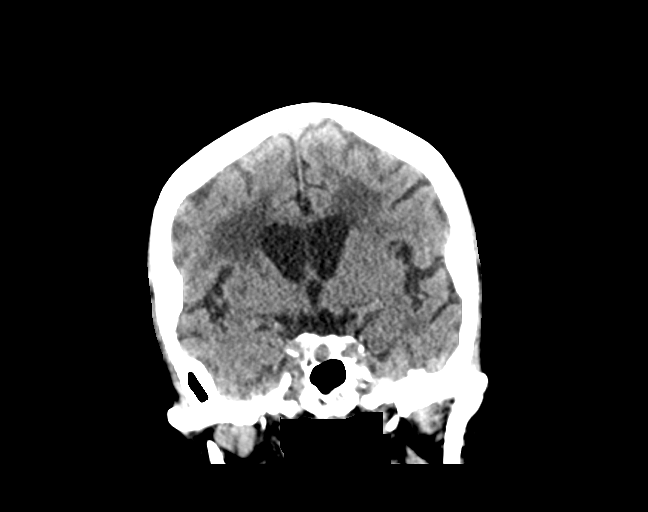
[im 39/70  brain]
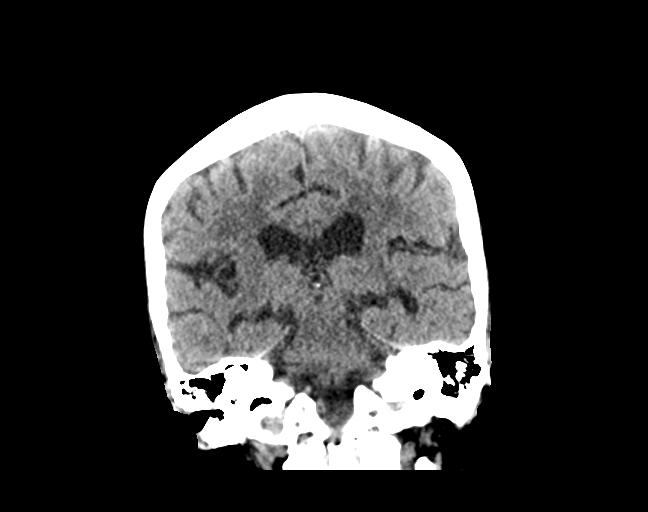

[Series 6: head without sag · sagittal · non-contrast · 0.33mm/px · 3 of 67 slices shown]
[im 23/67  brain]
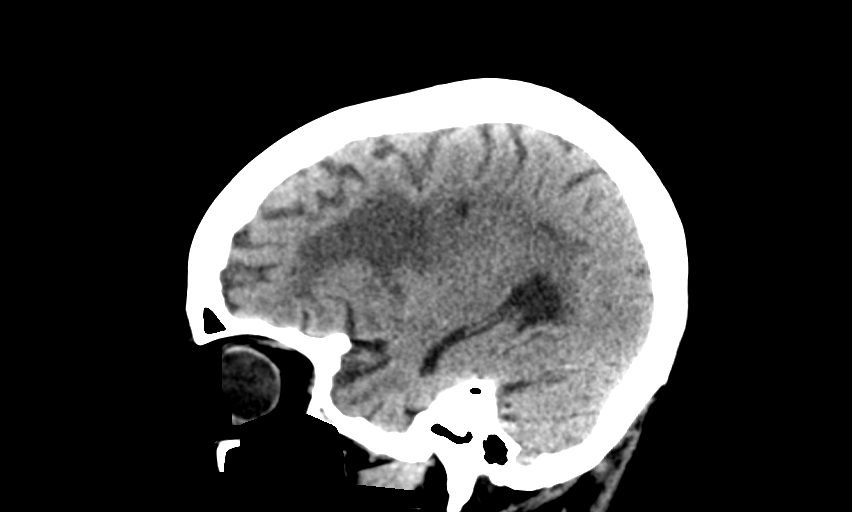
[im 34/67  brain]
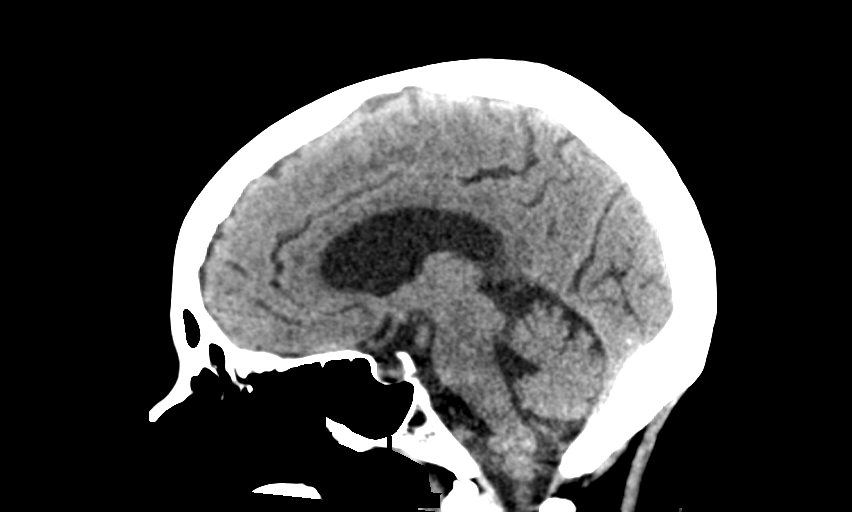
[im 45/67  brain]
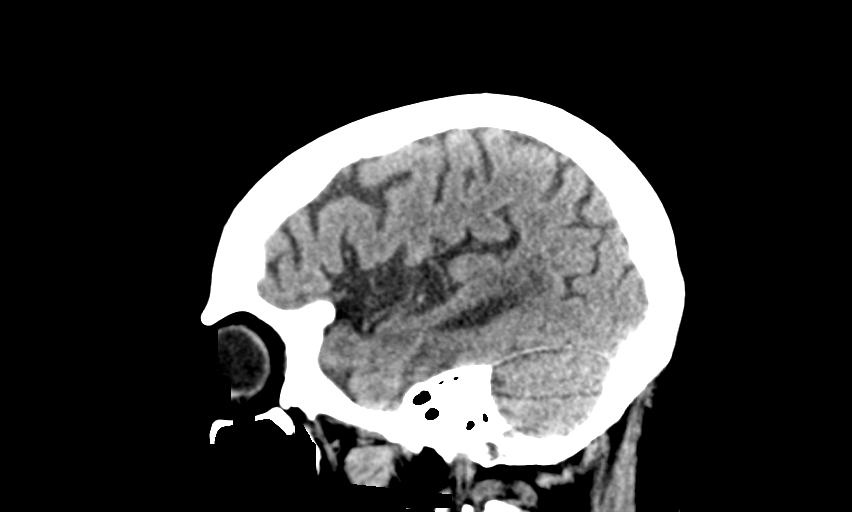

[15 of 47 positions shown; findings below may reference images not displayed]

FINDINGS: Brain: No evidence of acute infarction, hemorrhage, hydrocephalus,
extra-axial collection or mass lesion/mass effect. Severe
microvascular ischemic changes are noted. There is atrophy, greater
than expected for the patient's age. There is a new subcentimeter
hypoattenuating area in the white matter of the right frontal lobe
favored to represent evolution of the previously demonstrated
infarct in this region.

Vascular: No hyperdense vessel or unexpected calcification.

Skull: Normal. Negative for fracture or focal lesion.

Sinuses/Orbits: No acute finding.

Other: None.
IMPRESSION: 1. No acute intracranial abnormality.
2. Again noted are advanced chronic microvascular ischemic changes
and generalized atrophy. There is a new subcentimeter
hypoattenuating area within the white matter of the right frontal
lobe favored to represent the previously noted infarct in this area.

## 2021-01-21 ENCOUNTER — Encounter (HOSPITAL_COMMUNITY): Payer: Self-pay

## 2021-01-21 ENCOUNTER — Emergency Department (HOSPITAL_COMMUNITY): Payer: Medicare Other

## 2021-01-21 ENCOUNTER — Inpatient Hospital Stay (HOSPITAL_COMMUNITY)
Admission: EM | Admit: 2021-01-21 | Discharge: 2021-01-30 | DRG: 871 | Disposition: A | Discharge status: Skilled Nursing Facility | Payer: Medicare Other | Attending: Internal Medicine | Admitting: Internal Medicine

## 2021-01-21 ENCOUNTER — Other Ambulatory Visit: Payer: Self-pay

## 2021-01-21 DIAGNOSIS — E162 Hypoglycemia, unspecified: Secondary | ICD-10-CM | POA: Diagnosis present

## 2021-01-21 DIAGNOSIS — E876 Hypokalemia: Secondary | ICD-10-CM | POA: Diagnosis present

## 2021-01-21 DIAGNOSIS — Z8249 Family history of ischemic heart disease and other diseases of the circulatory system: Secondary | ICD-10-CM | POA: Diagnosis not present

## 2021-01-21 DIAGNOSIS — Z8673 Personal history of transient ischemic attack (TIA), and cerebral infarction without residual deficits: Secondary | ICD-10-CM

## 2021-01-21 DIAGNOSIS — D638 Anemia in other chronic diseases classified elsewhere: Secondary | ICD-10-CM

## 2021-01-21 DIAGNOSIS — R109 Unspecified abdominal pain: Secondary | ICD-10-CM

## 2021-01-21 DIAGNOSIS — I160 Hypertensive urgency: Secondary | ICD-10-CM | POA: Diagnosis not present

## 2021-01-21 DIAGNOSIS — Z79899 Other long term (current) drug therapy: Secondary | ICD-10-CM | POA: Diagnosis not present

## 2021-01-21 DIAGNOSIS — Z833 Family history of diabetes mellitus: Secondary | ICD-10-CM

## 2021-01-21 DIAGNOSIS — N183 Chronic kidney disease, stage 3 unspecified: Secondary | ICD-10-CM | POA: Diagnosis present

## 2021-01-21 DIAGNOSIS — I129 Hypertensive chronic kidney disease with stage 1 through stage 4 chronic kidney disease, or unspecified chronic kidney disease: Secondary | ICD-10-CM | POA: Diagnosis present

## 2021-01-21 DIAGNOSIS — M069 Rheumatoid arthritis, unspecified: Secondary | ICD-10-CM | POA: Diagnosis present

## 2021-01-21 DIAGNOSIS — K56699 Other intestinal obstruction unspecified as to partial versus complete obstruction: Secondary | ICD-10-CM | POA: Diagnosis present

## 2021-01-21 DIAGNOSIS — F0150 Vascular dementia without behavioral disturbance: Secondary | ICD-10-CM | POA: Diagnosis present

## 2021-01-21 DIAGNOSIS — Z7952 Long term (current) use of systemic steroids: Secondary | ICD-10-CM | POA: Diagnosis not present

## 2021-01-21 DIAGNOSIS — R52 Pain, unspecified: Secondary | ICD-10-CM

## 2021-01-21 DIAGNOSIS — E871 Hypo-osmolality and hyponatremia: Secondary | ICD-10-CM | POA: Diagnosis present

## 2021-01-21 DIAGNOSIS — E042 Nontoxic multinodular goiter: Secondary | ICD-10-CM | POA: Diagnosis present

## 2021-01-21 DIAGNOSIS — G928 Other toxic encephalopathy: Secondary | ICD-10-CM | POA: Diagnosis present

## 2021-01-21 DIAGNOSIS — G9340 Encephalopathy, unspecified: Secondary | ICD-10-CM | POA: Diagnosis not present

## 2021-01-21 DIAGNOSIS — W19XXXA Unspecified fall, initial encounter: Secondary | ICD-10-CM | POA: Diagnosis present

## 2021-01-21 DIAGNOSIS — G9341 Metabolic encephalopathy: Secondary | ICD-10-CM | POA: Diagnosis not present

## 2021-01-21 DIAGNOSIS — N179 Acute kidney failure, unspecified: Secondary | ICD-10-CM | POA: Diagnosis present

## 2021-01-21 DIAGNOSIS — E860 Dehydration: Secondary | ICD-10-CM | POA: Diagnosis present

## 2021-01-21 DIAGNOSIS — A419 Sepsis, unspecified organism: Principal | ICD-10-CM

## 2021-01-21 DIAGNOSIS — N1832 Chronic kidney disease, stage 3b: Secondary | ICD-10-CM | POA: Diagnosis not present

## 2021-01-21 DIAGNOSIS — R680 Hypothermia, not associated with low environmental temperature: Secondary | ICD-10-CM | POA: Diagnosis present

## 2021-01-21 DIAGNOSIS — Y92039 Unspecified place in apartment as the place of occurrence of the external cause: Secondary | ICD-10-CM

## 2021-01-21 DIAGNOSIS — N390 Urinary tract infection, site not specified: Secondary | ICD-10-CM

## 2021-01-21 DIAGNOSIS — M05749 Rheumatoid arthritis with rheumatoid factor of unspecified hand without organ or systems involvement: Secondary | ICD-10-CM | POA: Diagnosis not present

## 2021-01-21 DIAGNOSIS — N3000 Acute cystitis without hematuria: Secondary | ICD-10-CM

## 2021-01-21 DIAGNOSIS — S0081XA Abrasion of other part of head, initial encounter: Secondary | ICD-10-CM | POA: Diagnosis present

## 2021-01-21 DIAGNOSIS — Z20822 Contact with and (suspected) exposure to covid-19: Secondary | ICD-10-CM | POA: Diagnosis present

## 2021-01-21 DIAGNOSIS — Z7982 Long term (current) use of aspirin: Secondary | ICD-10-CM | POA: Diagnosis not present

## 2021-01-21 DIAGNOSIS — M4856XA Collapsed vertebra, not elsewhere classified, lumbar region, initial encounter for fracture: Secondary | ICD-10-CM | POA: Diagnosis present

## 2021-01-21 DIAGNOSIS — Z781 Physical restraint status: Secondary | ICD-10-CM

## 2021-01-21 LAB — CBC WITH DIFFERENTIAL/PLATELET
Abs Immature Granulocytes: 0.07 10*3/uL (ref 0.00–0.07)
Basophils Absolute: 0 10*3/uL (ref 0.0–0.1)
Basophils Relative: 0 %
Eosinophils Absolute: 0.1 10*3/uL (ref 0.0–0.5)
Eosinophils Relative: 1 %
HCT: 36.5 % (ref 36.0–46.0)
Hemoglobin: 11.8 g/dL — ABNORMAL LOW (ref 12.0–15.0)
Immature Granulocytes: 1 %
Lymphocytes Relative: 12 %
Lymphs Abs: 1.5 10*3/uL (ref 0.7–4.0)
MCH: 30.4 pg (ref 26.0–34.0)
MCHC: 32.3 g/dL (ref 30.0–36.0)
MCV: 94.1 fL (ref 80.0–100.0)
Monocytes Absolute: 0.9 10*3/uL (ref 0.1–1.0)
Monocytes Relative: 8 %
Neutro Abs: 9.6 10*3/uL — ABNORMAL HIGH (ref 1.7–7.7)
Neutrophils Relative %: 78 %
Platelets: 233 10*3/uL (ref 150–400)
RBC: 3.88 MIL/uL (ref 3.87–5.11)
RDW: 14.2 % (ref 11.5–15.5)
WBC: 12.2 10*3/uL — ABNORMAL HIGH (ref 4.0–10.5)
nRBC: 0 % (ref 0.0–0.2)

## 2021-01-21 LAB — RAPID URINE DRUG SCREEN, HOSP PERFORMED
Amphetamines: NOT DETECTED
Barbiturates: NOT DETECTED
Benzodiazepines: NOT DETECTED
Cocaine: NOT DETECTED
Opiates: NOT DETECTED
Tetrahydrocannabinol: NOT DETECTED

## 2021-01-21 LAB — URINALYSIS, COMPLETE (UACMP) WITH MICROSCOPIC
Bilirubin Urine: NEGATIVE
Glucose, UA: NEGATIVE mg/dL
Hgb urine dipstick: NEGATIVE
Ketones, ur: NEGATIVE mg/dL
Nitrite: POSITIVE — AB
Protein, ur: 100 mg/dL — AB
Specific Gravity, Urine: 1.018 (ref 1.005–1.030)
WBC, UA: >50 WBC/hpf — ABNORMAL HIGH (ref 0–5)
pH: 6 (ref 5.0–8.0)

## 2021-01-21 LAB — BLOOD GAS, ARTERIAL
Acid-Base Excess: 1.8 mmol/L (ref 0.0–2.0)
Bicarbonate: 25.7 mmol/L (ref 20.0–28.0)
Drawn by: 51133
FIO2: 28
O2 Saturation: 98 %
Patient temperature: 37.6
pCO2 arterial: 39.7 mmHg (ref 32.0–48.0)
pH, Arterial: 7.429 (ref 7.350–7.450)
pO2, Arterial: 111 mmHg — ABNORMAL HIGH (ref 83.0–108.0)

## 2021-01-21 LAB — I-STAT VENOUS BLOOD GAS, ED
Acid-Base Excess: 4 mmol/L — ABNORMAL HIGH (ref 0.0–2.0)
Bicarbonate: 29.4 mmol/L — ABNORMAL HIGH (ref 20.0–28.0)
Calcium, Ion: 1.13 mmol/L — ABNORMAL LOW (ref 1.15–1.40)
HCT: 37 % (ref 36.0–46.0)
Hemoglobin: 12.6 g/dL (ref 12.0–15.0)
O2 Saturation: 51 %
Potassium: 3.6 mmol/L (ref 3.5–5.1)
Sodium: 143 mmol/L (ref 135–145)
TCO2: 31 mmol/L (ref 22–32)
pCO2, Ven: 46.1 mmHg (ref 44.0–60.0)
pH, Ven: 7.413 (ref 7.250–7.430)
pO2, Ven: 27 mmHg — CL (ref 32.0–45.0)

## 2021-01-21 LAB — COMPREHENSIVE METABOLIC PANEL
ALT: 16 U/L (ref 0–44)
AST: 27 U/L (ref 15–41)
Albumin: 4 g/dL (ref 3.5–5.0)
Alkaline Phosphatase: 88 U/L (ref 38–126)
Anion gap: 9 (ref 5–15)
BUN: 31 mg/dL — ABNORMAL HIGH (ref 8–23)
CO2: 28 mmol/L (ref 22–32)
Calcium: 9.8 mg/dL (ref 8.9–10.3)
Chloride: 105 mmol/L (ref 98–111)
Creatinine, Ser: 1.79 mg/dL — ABNORMAL HIGH (ref 0.44–1.00)
GFR, Estimated: 31 mL/min — ABNORMAL LOW (ref >60)
Glucose, Bld: 117 mg/dL — ABNORMAL HIGH (ref 70–99)
Potassium: 4.2 mmol/L (ref 3.5–5.1)
Sodium: 142 mmol/L (ref 135–145)
Total Bilirubin: 0.7 mg/dL (ref 0.3–1.2)
Total Protein: 7.5 g/dL (ref 6.5–8.1)

## 2021-01-21 LAB — PROTIME-INR
INR: 1 (ref 0.8–1.2)
Prothrombin Time: 13.2 seconds (ref 11.4–15.2)

## 2021-01-21 LAB — GLUCOSE, CAPILLARY
Glucose-Capillary: 55 mg/dL — ABNORMAL LOW (ref 70–99)
Glucose-Capillary: 74 mg/dL (ref 70–99)
Glucose-Capillary: 63 mg/dL — ABNORMAL LOW (ref 70–99)
Glucose-Capillary: 116 mg/dL — ABNORMAL HIGH (ref 70–99)
Glucose-Capillary: 111 mg/dL — ABNORMAL HIGH (ref 70–99)

## 2021-01-21 LAB — AMMONIA: Ammonia: 27 umol/L (ref 9–35)

## 2021-01-21 LAB — LACTIC ACID, PLASMA
Lactic Acid, Venous: 2.8 mmol/L (ref 0.5–1.9)
Lactic Acid, Venous: 3.3 mmol/L (ref 0.5–1.9)

## 2021-01-21 LAB — CBG MONITORING, ED: Glucose-Capillary: 93 mg/dL (ref 70–99)

## 2021-01-21 LAB — ETHANOL: Alcohol, Ethyl (B): <10 mg/dL (ref <10)

## 2021-01-21 LAB — CK: Total CK: 281 U/L — ABNORMAL HIGH (ref 38–234)

## 2021-01-21 LAB — TSH: TSH: 1.692 u[IU]/mL (ref 0.350–4.500)

## 2021-01-21 MED ORDER — ALBUTEROL SULFATE (2.5 MG/3ML) 0.083% IN NEBU: 2.5000 mg | INHALATION_SOLUTION | Freq: Four times a day (QID) | RESPIRATORY_TRACT | Status: DC | PRN

## 2021-01-21 MED ORDER — MIDAZOLAM HCL 2 MG/2ML IJ SOLN
2.0000 mg | Freq: Once | INTRAMUSCULAR | Status: AC
  Administered 2021-01-21: 2 mg via INTRAMUSCULAR

## 2021-01-21 MED ORDER — SODIUM CHLORIDE 0.9 % IV SOLN: INTRAVENOUS | Status: DC

## 2021-01-21 MED ORDER — HALOPERIDOL LACTATE 5 MG/ML IJ SOLN
5.0000 mg | Freq: Once | INTRAMUSCULAR | Status: DC
  Administered 2021-01-21: 5 mg via INTRAMUSCULAR

## 2021-01-21 MED ORDER — HALOPERIDOL LACTATE 5 MG/ML IJ SOLN
INTRAMUSCULAR | Status: AC
  Filled 2021-01-21: qty 1

## 2021-01-21 MED ORDER — DEXTROSE 10 % IV SOLN
INTRAVENOUS | Status: DC
  Filled 2021-01-21 (×5): qty 500

## 2021-01-21 MED ORDER — DEXTROSE 50 % IV SOLN
INTRAVENOUS | Status: AC
  Administered 2021-01-21: 50 mL
  Filled 2021-01-21: qty 50

## 2021-01-21 MED ORDER — ACETAMINOPHEN 325 MG PO TABS
650.0000 mg | ORAL_TABLET | Freq: Four times a day (QID) | ORAL | Status: DC | PRN
  Administered 2021-01-23 – 2021-01-27 (×3): 650 mg via ORAL
  Filled 2021-01-21 (×5): qty 2

## 2021-01-21 MED ORDER — HYDRALAZINE HCL 20 MG/ML IJ SOLN
10.0000 mg | INTRAMUSCULAR | Status: DC | PRN
  Administered 2021-01-21: 20 mg via INTRAVENOUS
  Administered 2021-01-22: 10 mg via INTRAVENOUS
  Administered 2021-01-23 – 2021-01-24 (×6): 20 mg via INTRAVENOUS
  Filled 2021-01-21 (×8): qty 1

## 2021-01-21 MED ORDER — SODIUM CHLORIDE 0.9% FLUSH
3.0000 mL | Freq: Two times a day (BID) | INTRAVENOUS | Status: DC
  Administered 2021-01-21 – 2021-01-29 (×15): 3 mL via INTRAVENOUS

## 2021-01-21 MED ORDER — SODIUM CHLORIDE 0.9 % IV BOLUS
1000.0000 mL | Freq: Once | INTRAVENOUS | Status: AC
  Administered 2021-01-21: 1000 mL via INTRAVENOUS

## 2021-01-21 MED ORDER — DEXMEDETOMIDINE HCL IN NACL 400 MCG/100ML IV SOLN
0.2000 ug/kg/h | INTRAVENOUS | Status: AC
  Administered 2021-01-21: 0.2 ug/kg/h via INTRAVENOUS
  Administered 2021-01-22: 0.4 ug/kg/h via INTRAVENOUS
  Administered 2021-01-22: 0.6 ug/kg/h via INTRAVENOUS
  Administered 2021-01-22: 0.7 ug/kg/h via INTRAVENOUS
  Filled 2021-01-21 (×4): qty 100

## 2021-01-21 MED ORDER — SODIUM CHLORIDE 0.9 % IV SOLN
2.0000 g | INTRAVENOUS | Status: AC
  Administered 2021-01-22 – 2021-01-25 (×4): 2 g via INTRAVENOUS
  Filled 2021-01-21 (×5): qty 20

## 2021-01-21 MED ORDER — LORAZEPAM 2 MG/ML IJ SOLN
INTRAMUSCULAR | Status: AC
  Filled 2021-01-21: qty 1

## 2021-01-21 MED ORDER — HYDRALAZINE HCL 20 MG/ML IJ SOLN
10.0000 mg | INTRAMUSCULAR | Status: DC | PRN
  Administered 2021-01-21: 10 mg via INTRAVENOUS
  Filled 2021-01-21: qty 1

## 2021-01-21 MED ORDER — WHITE PETROLATUM EX OINT
TOPICAL_OINTMENT | CUTANEOUS | Status: AC
  Administered 2021-01-21: 0.2
  Filled 2021-01-21: qty 28.35

## 2021-01-21 MED ORDER — ENOXAPARIN SODIUM 40 MG/0.4ML ~~LOC~~ SOLN
40.0000 mg | SUBCUTANEOUS | Status: DC
  Administered 2021-01-21 – 2021-01-29 (×9): 40 mg via SUBCUTANEOUS
  Filled 2021-01-21 (×9): qty 0.4

## 2021-01-21 MED ORDER — CHLORHEXIDINE GLUCONATE CLOTH 2 % EX PADS
6.0000 | MEDICATED_PAD | Freq: Every day | CUTANEOUS | Status: DC
  Administered 2021-01-21 – 2021-01-24 (×2): 6 via TOPICAL

## 2021-01-21 MED ORDER — LORAZEPAM 2 MG/ML IJ SOLN
INTRAMUSCULAR | Status: AC
  Administered 2021-01-21: 1 mg via INTRAVENOUS
  Filled 2021-01-21: qty 1

## 2021-01-21 MED ORDER — MIDAZOLAM HCL 2 MG/2ML IJ SOLN
INTRAMUSCULAR | Status: AC
  Filled 2021-01-21: qty 2

## 2021-01-21 MED ORDER — SODIUM CHLORIDE 0.9 % IV SOLN
2.0000 g | Freq: Once | INTRAVENOUS | Status: AC
  Administered 2021-01-21: 2 g via INTRAVENOUS
  Filled 2021-01-21: qty 20

## 2021-01-21 MED ORDER — ACETAMINOPHEN 650 MG RE SUPP: 650.0000 mg | Freq: Four times a day (QID) | RECTAL | Status: DC | PRN

## 2021-01-21 MED ORDER — LORAZEPAM 2 MG/ML IJ SOLN: 1.0000 mg | Freq: Once | INTRAMUSCULAR | Status: AC

## 2021-01-21 NOTE — Significant Event (Signed)
Rapid Response Event Note   Reason for Call : Decreased LOC  Initial Focused Assessment: Upon arrival, patient having snoring respirations. Responds to painful stimuli and minimally to verbal stimuli. Unable to answer orientation questions. Moving all extremities, but not to command. Pupils were 4 and sluggish bilaterally. Intermittently clenching fists with internal rotation of arms. Lungs were diminished throughout. Skin was warm and dry. CBG 55 on arrival. 1/2 amp of D50 given by bedside RN prior to RRRN arrival. Follow up CBG 74.   Additional 1/2 amp of Dextrose given by RN per Dr. Katrinka Blazing. 15 min later, CBG 63. MD made aware  VS: 99.64F Rectal, BP 163/112 Left Wrist, HR 84, RR 17. CBG: 55 >>> 74  Interventions:  -Dr. Katrinka Blazing called to bedside to evaluate and to consult PCCM  Plan of Care:  -Frequent neuro assessments and vital signs. -High risk for needing higher level of care.  Call RRRN for additional needs.  Event Summary:   MD Notified: Dr. Katrinka Blazing Call [id number] Arrival Time:1605 End Time: 1715  Marin Comment, RN

## 2021-01-21 NOTE — ED Provider Notes (Signed)
[facility] EMERGENCY DEPARTMENT Provider Note   CSN: [id number] Arrival date & time: 01/21/21  0240     History Chief Complaint  Patient presents with  . Altered Mental Status    Responsive to pain only, unknown down time    Michelle Graves is a 70 y.o. female who presents via EMS after being found down on the floor apartment.  Patient is awake but unresponsive to verbal stimuli, moving all 4 extremities at this time.  Agitated. Unable to contribute to her HPI.   LEVEL 5 CAVEAT due to altered mental status.  Collateral information obtained from patient's daughter, [name].  She states that her mother is cognitively intact at baseline, able to care for herself and ambulate in her own home.  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  She states her mother has become disoriented in the past with urinary tract infection, and has had strokes in the past without residual deficit.  She is not on any anticoagulation, completed 3 months of Eliquis treatment after her CVA aspirin daily at this time.  The patient's daughter states that she saw yesterday around 12:30 in the afternoon and she was behaving normally for herself.  Additionally she spoke to her at 5 PM last night patient was interacting normally for her, but stated she was feeling more tired and was planning to go to bed earlier than normal.  The patient did not answer her telephone late yesterday.  When the patient did not answer her cell phone this morning, her daughter called the building superintendent check on department where he found her down on the ground in her home, breathing but unresponsive.  Patient has been agitated while emergency department.  I personally reviewed this patient's medical records.  She has history of stroke, diverticulitis, CKD, hypertension.   HPI     Past Medical History:  Diagnosis Date  . Acute on chronic kidney failure (HCC)   . Anemia   .  Arthritis   . Chest pain   . Diverticulitis   . Hypertension   . Stroke (HCC)   . UTI (urinary tract infection) 10/2019    Patient Active Problem List   Diagnosis Date Noted  . Acute metabolic encephalopathy 01/21/2021  . Hypertensive urgency 01/21/2021  . Sepsis secondary to UTI (HCC) 01/21/2021  . Adrenal insufficiency (HCC) 12/18/2020  . Encephalopathy 12/17/2020  . TIA (transient ischemic attack) 12/15/2020  . 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 Bowel obstruction (HCC) 05/06/2020  . SIRS (systemic inflammatory response syndrome) (HCC) 05/06/2020  . UTI (urinary tract infection) 11/08/2019  . Major depressive disorder, recurrent episode, moderate (HCC) 11/08/2019  . Urinary retention 10/28/2019  . Acute on chronic kidney failure (HCC) 10/27/2019  . Malnutrition of moderate degree 10/03/2019  . Colitis 09/30/2019  . AKI (acute kidney injury) (HCC) 05/28/2019  . Nausea & vomiting 05/28/2019  . 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 Anemia, chronic disease 05/28/2019  . Swelling   . Small vessel disease, cerebrovascular   . Acute ischemic stroke (HCC)   . Weakness   . Acute left ankle pain   . Acute on chronic renal failure (HCC) 05/13/2019  . Weakness of both lower extremities 05/13/2019  . Cerebral thrombosis with cerebral infarction 03/31/2019  . New onset a-fib (HCC) 03/27/2019  . Acute kidney injury superimposed on CKD (HCC) 03/24/2019  . Hypotension 03/24/2019  . Hematemesis 03/24/2019  . Dark stools 03/24/2019  . Diverticulitis of intestine without  perforation or abscess without bleeding   . Sinus tachycardia 08/27/2016  . Diverticulitis 08/26/2016  . HYPERTENSION, MALIGNANT ESSENTIAL 06/24/2007  . CKD (chronic kidney disease), stage III (HCC) 06/24/2007  . Rheumatoid arthritis (HCC) 06/24/2007    Past Surgical History:  Procedure Laterality Date  . BIOPSY  04/01/2019   Procedure: BIOPSY;  Surgeon: Kathi Der, MD;  Location: [facility] ENDOSCOPY;  Service:  Gastroenterology;;  . BIOPSY  10/05/2019   Procedure: BIOPSY;  Surgeon: Kathi Der, MD;  Location: [facility] ENDOSCOPY;  Service: Gastroenterology;;  . BIOPSY  05/10/2020   Procedure: BIOPSY;  Surgeon: Kathi Der, MD;  Location: [facility] ENDOSCOPY;  Service: Gastroenterology;;  . BIOPSY  11/29/2020   Procedure: BIOPSY;  Surgeon: Charlott Rakes, MD;  Location: [facility] ENDOSCOPY;  Service: Gastroenterology;;  . COLONOSCOPY N/A 11/29/2020   Procedure: COLONOSCOPY;  Surgeon: Charlott Rakes, MD;  Location: [facility] ENDOSCOPY;  Service: Gastroenterology;  Laterality: N/A;  . COLONOSCOPY WITH PROPOFOL N/A 04/01/2019   Procedure: COLONOSCOPY WITH PROPOFOL;  Surgeon: Kathi Der, MD;  Location: [facility] ENDOSCOPY;  Service: Gastroenterology;  Laterality: N/A;  . COLONOSCOPY WITH PROPOFOL N/A 10/05/2019   Procedure: COLONOSCOPY WITH PROPOFOL;  Surgeon: Kathi Der, MD;  Location: [facility] ENDOSCOPY;  Service: Gastroenterology;  Laterality: N/A;  . COLONOSCOPY WITH PROPOFOL N/A 05/10/2020   Procedure: COLONOSCOPY WITH PROPOFOL;  Surgeon: Kathi Der, MD;  Location: [facility] ENDOSCOPY;  Service: Gastroenterology;  Laterality: N/A;  . ESOPHAGOGASTRODUODENOSCOPY (EGD) WITH PROPOFOL N/A 04/01/2019   Procedure: ESOPHAGOGASTRODUODENOSCOPY (EGD) WITH PROPOFOL;  Surgeon: Kathi Der, MD;  Location: [facility] ENDOSCOPY;  Service: Gastroenterology;  Laterality: N/A;  . ESOPHAGOGASTRODUODENOSCOPY (EGD) WITH PROPOFOL N/A 10/05/2019   Procedure: ESOPHAGOGASTRODUODENOSCOPY (EGD) WITH PROPOFOL;  Surgeon: Kathi Der, MD;  Location: [facility] ENDOSCOPY;  Service: Gastroenterology;  Laterality: N/A;  . POLYPECTOMY  11/29/2020   Procedure: POLYPECTOMY;  Surgeon: Charlott Rakes, MD;  Location: [facility] ENDOSCOPY;  Service: Gastroenterology;;  . Sunnie Nielsen TATTOO INJECTION  11/29/2020   Procedure: SUBMUCOSAL TATTOO INJECTION;  Surgeon: Charlott Rakes, MD;  Location: [facility] ENDOSCOPY;  Service: Gastroenterology;;  . TONSILLECTOMY       OB History    No obstetric history on file.     Family History  Problem Relation Age of Onset  . Hypertension Mother   . Diabetes Mother   . CAD Father        died of MI at age 52  . Hypertension Father   . Diabetes Sister   . Diabetes Sister   . Kidney disease Neg Hx     Social History   Tobacco Use  . Smoking status: Never Smoker  . Smokeless tobacco: Never Used  Vaping Use  . Vaping Use: Never used  Substance Use Topics  . Alcohol use: Not Currently    Alcohol/week: 0.0 standard drinks    Comment: sometimes   . Drug use: No    Home Medications Prior to Admission medications   Medication Sig Start Date End Date Taking? Authorizing Provider  aspirin 81 MG EC tablet Take 1 tablet (81 mg total) by mouth daily. 01/03/21 01/03/22 Yes Fargo, Amy E, NP  atorvastatin (LIPITOR) 40 MG tablet Take 1 tablet (40 mg total) by mouth daily. 01/03/21 04/03/21 Yes Fargo, Amy E, NP  carvedilol (COREG) 25 MG tablet Take 1 tablet (25 mg total) by mouth 2 (two) times daily. 01/03/21  Yes Fargo, Amy E, NP  cloNIDine (CATAPRES) 0.3 MG tablet Take 1 tablet (0.3 mg total) by mouth 3 (three) times daily. 01/03/21  Yes Fargo, Amy E, NP  diltiazem (  CARDIZEM CD) 180 MG 24 hr capsule Take 1 capsule (180 mg total) by mouth 2 (two) times daily. 01/03/21  Yes Fargo, Amy E, NP  docusate sodium (COLACE) 100 MG capsule Take 1 capsule (100 mg total) by mouth 2 (two) times daily. 12/01/20  Yes Berton Mount I, MD  DULoxetine (CYMBALTA) 60 MG capsule Take 1 capsule (60 mg total) by mouth at bedtime. 01/03/21  Yes Fargo, Amy E, NP  Ensure (ENSURE) Take 237 mLs by mouth daily in the afternoon. Vanilla   Yes [provider]  hydroxychloroquine (PLAQUENIL) 200 MG tablet Take 1 tablet (200 mg total) by mouth daily. 01/03/21  Yes Fargo, Amy E, NP  isosorbide mononitrate (IMDUR) 30 MG 24 hr tablet Take 1 tablet (30 mg total) by mouth daily. 01/03/21  Yes Fargo, Amy E, NP  lactulose (CHRONULAC) 10 GM/15ML solution Take 30 mLs (20  g total) by mouth 2 (two) times daily. Patient taking differently: Take 10 g by mouth 2 (two) times daily. 01/03/21  Yes Fargo, Amy E, NP  Multiple Vitamin (MULTIVITAMIN WITH MINERALS) TABS tablet Take 1 tablet by mouth daily. 06/03/19  Yes Shahmehdi, Seyed A, MD  pantoprazole (PROTONIX) 40 MG tablet Take 1 tablet (40 mg total) by mouth daily. 01/03/21  Yes Fargo, Amy E, NP  polyethylene glycol (MIRALAX / GLYCOLAX) 17 g packet Take 17 g by mouth daily as needed for mild constipation. 12/11/20  Yes Glade Lloyd, MD  QUEtiapine (SEROQUEL) 100 MG tablet Take 1 tablet (100 mg total) by mouth daily. 01/03/21  Yes Fargo, Amy E, NP    Allergies    Patient has no known allergies.  Review of Systems   Review of Systems  Unable to perform ROS: Mental status change    Physical Exam Updated Vital Signs BP (!) 169/103   Pulse 64   Temp 99.6 F (37.6 C) (Rectal)   Resp 17   Ht  (1.702 m)   Wt 86.5 kg   SpO2 97%   BMI 29.87 kg/m   Physical Exam Vitals and nursing note reviewed.  Constitutional:      General: She is awake.     Appearance: She is obese. She is toxic-appearing.  HENT:     Head: Normocephalic and atraumatic.      Nose: Nose normal.     Mouth/Throat:     Mouth: Mucous membranes are moist.     Pharynx: Oropharynx is clear. Uvula midline. No oropharyngeal exudate, posterior oropharyngeal erythema or uvula swelling.     Tonsils: No tonsillar exudate.  Eyes:     General: Lids are normal.        Right eye: No discharge.        Left eye: No discharge.     Conjunctiva/sclera: Conjunctivae normal.     Pupils: Pupils are equal, round, and reactive to light.  Neck:     Trachea: Trachea normal.  Cardiovascular:     Rate and Rhythm: Normal rate and regular rhythm.     Pulses: Normal pulses.     Heart sounds: Normal heart sounds. No murmur heard.   Pulmonary:     Effort: Pulmonary effort is normal. No respiratory distress.     Breath sounds: Normal breath sounds.  Transmitted upper airway sounds present. No wheezing or rales.  Chest:     Chest wall: No mass, lacerations, deformity, swelling, tenderness or crepitus.  Abdominal:     General: Bowel sounds are normal. There is no distension.  Tenderness: There is no abdominal tenderness.  Musculoskeletal:        General: No deformity.     Cervical back: Neck supple. No crepitus. No pain with movement.     Right lower leg: No edema.     Left lower leg: No edema.  Lymphadenopathy:     Cervical: No cervical adenopathy.  Skin:    General: Skin is warm and dry.     Capillary Refill: Capillary refill takes less than 2 seconds.  Neurological:     Mental Status: She is disoriented and unresponsive.     Motor: Motor function is intact.     Comments: Patient is agitated, moving all 4 extremities spontaneously without difficulty, but uncooperative and unresponsive to verbal stimuli.  Sedation was required in order to complete evaluation.  Patient is nonparticipatory, neurologic exam was not able to be performed.  Psychiatric:        Mood and Affect: Mood normal.        Behavior: Behavior is uncooperative.     ED Results / Procedures / Treatments   Labs (all labs ordered are listed, but only abnormal results are displayed) Labs Reviewed  COMPREHENSIVE METABOLIC PANEL - Abnormal; Notable for the following components:      Result Value   Glucose, Bld 117 (*)    BUN 31 (*)    Creatinine, Ser 1.79 (*)    GFR, Estimated 31 (*)    All other components within normal limits  CBC WITH DIFFERENTIAL/PLATELET - Abnormal; Notable for the following components:   WBC 12.2 (*)    Hemoglobin 11.8 (*)    Neutro Abs 9.6 (*)    All other components within normal limits  URINALYSIS, COMPLETE (UACMP) WITH MICROSCOPIC - Abnormal; Notable for the following components:   Color, Urine AMBER (*)    APPearance CLOUDY (*)    Protein, ur 100 (*)    Nitrite POSITIVE (*)    Leukocytes,Ua LARGE (*)    WBC, UA >50 (*)     Bacteria, UA MANY (*)    Non Squamous Epithelial 0-5 (*)    All other components within normal limits  LACTIC ACID, PLASMA - Abnormal; Notable for the following components:   Lactic Acid, Venous 2.8 (*)    All other components within normal limits  CK - Abnormal; Notable for the following components:   Total CK 281 (*)    All other components within normal limits  GLUCOSE, CAPILLARY - Abnormal; Notable for the following components:   Glucose-Capillary 55 (*)    All other components within normal limits  GLUCOSE, CAPILLARY - Abnormal; Notable for the following components:   Glucose-Capillary 63 (*)    All other components within normal limits  I-STAT VENOUS BLOOD GAS, ED - Abnormal; Notable for the following components:   pO2, Ven 27.0 (*)    Bicarbonate 29.4 (*)    Acid-Base Excess 4.0 (*)    Calcium, Ion 1.13 (*)    All other components within normal limits  CULTURE, BLOOD (ROUTINE X 2)  CULTURE, BLOOD (ROUTINE X 2)  URINE CULTURE  AMMONIA  RAPID URINE DRUG SCREEN, HOSP PERFORMED  PROTIME-INR  GLUCOSE, CAPILLARY  ETHANOL  TSH  CBC  BASIC METABOLIC PANEL  LACTIC ACID, PLASMA  LACTIC ACID, PLASMA  CBG MONITORING, ED    EKG EKG Interpretation  Date/Time:  Monday January 21 2021 09:36:34 EDT Ventricular Rate:  92 PR Interval:    QRS Duration: 111 QT Interval:  422 QTC Calculation: 523 R  Axis:   -4 Text Interpretation: Sinus rhythm Consider left atrial enlargement Prolonged QT interval Confirmed by Virgina Norfolk [phone]) on 01/21/2021 12:18:07 PM   Radiology CT HEAD WO CONTRAST  Result Date: 01/21/2021 CLINICAL DATA:  Altered mental status. Additional history provided: Patient found down on floor at home, unknown down time, patient favoring right side, confusion, agitated. EXAM: CT HEAD WITHOUT CONTRAST CT CERVICAL SPINE WITHOUT CONTRAST TECHNIQUE: Multidetector CT imaging of the head and cervical spine was performed following the standard protocol without intravenous  contrast. Multiplanar CT image reconstructions of the cervical spine were also generated. COMPARISON:  Brain MRI 12/15/2020. cervical spine radiographs 02/14/2009. FINDINGS: CT HEAD FINDINGS Brain: Mildly motion degraded exam. Mild generalized parenchymal atrophy. Redemonstrated chronic small-vessel infarcts within the right corona radiata and within the deep gray nuclei. Background advanced patchy and ill-defined hypoattenuation within the cerebral white matter is nonspecific, but also compatible with chronic small vessel ischemic disease. Known small chronic infarcts within the bilateral cerebellar hemispheres were better appreciated on the prior brain MRI of 12/15/2020. There is no acute intracranial hemorrhage. No demarcated cortical infarct. No extra-axial fluid collection. No evidence of intracranial mass. No midline shift. Vascular: No hyperdense vessel.  Atherosclerotic calcifications Skull: Normal. Negative for fracture or focal lesion. Sinuses/Orbits: Visualized orbits show no acute finding. No significant paranasal sinus disease at the imaged levels. Other: Mild right anterior scalp soft tissue swelling is questioned. CT CERVICAL SPINE FINDINGS Mildly motion degraded exam. Alignment: Straightening of the expected cervical lordosis. No significant spondylolisthesis. Skull base and vertebrae: The basion-dental and atlanto-dental intervals are maintained.No evidence of acute fracture to the cervical spine. Soft tissues and spinal canal: No prevertebral fluid or swelling. No visible canal hematoma. Disc levels: Cervical spondylosis with multilevel disc space narrowing, central disc protrusions, disc bulges, uncovertebral hypertrophy and facet arthrosis. Multilevel spinal canal stenosis. Most notably, a disc bulge at C3-C4 contributes to suspected mild/moderate spinal canal stenosis. Multilevel bony neural foraminal narrowing. Upper chest: No consolidation within the imaged lung apices. No visible  pneumothorax. Other: Heterogeneous enhancement of the thyroid gland. Additionally, there is asymmetric prominence of the left thyroid lobe. Multiple thyroid nodules. Dominant left thyroid lobe nodule measuring up to 1.6 cm (series 5, image 57). IMPRESSION: CT head: 1. Mildly motion degraded exam. 2. No evidence of acute intracranial abnormality. 3. Mild right anterior scalp soft tissue swelling is questioned. 4. Redemonstrated mild generalized parenchymal atrophy and advanced chronic small vessel ischemic disease. CT cervical spine: 1. Mildly motion degraded exam. 2. No evidence of acute fracture to the cervical spine. 3. Nonspecific straightening of the expected cervical lordosis. 4. Cervical spondylosis, as described. 5. Thyroid nodules, the largest measuring 1.6 cm within the left lobe. Nonemergent thyroid ultrasound is recommended for further evaluation. Electronically Signed   By: Jackey Loge DO   On: 01/21/2021 11:18   CT CERVICAL SPINE WO CONTRAST  Result Date: 01/21/2021 CLINICAL DATA:  Altered mental status. Additional history provided: Patient found down on floor at home, unknown down time, patient favoring right side, confusion, agitated. EXAM: CT HEAD WITHOUT CONTRAST CT CERVICAL SPINE WITHOUT CONTRAST TECHNIQUE: Multidetector CT imaging of the head and cervical spine was performed following the standard protocol without intravenous contrast. Multiplanar CT image reconstructions of the cervical spine were also generated. COMPARISON:  Brain MRI 12/15/2020. cervical spine radiographs 02/14/2009. FINDINGS: CT HEAD FINDINGS Brain: Mildly motion degraded exam. Mild generalized parenchymal atrophy. Redemonstrated chronic small-vessel infarcts within the right corona radiata and within the deep gray nuclei. Background advanced  patchy and ill-defined hypoattenuation within the cerebral white matter is nonspecific, but also compatible with chronic small vessel ischemic disease. Known small chronic infarcts  within the bilateral cerebellar hemispheres were better appreciated on the prior brain MRI of 12/15/2020. There is no acute intracranial hemorrhage. No demarcated cortical infarct. No extra-axial fluid collection. No evidence of intracranial mass. No midline shift. Vascular: No hyperdense vessel.  Atherosclerotic calcifications Skull: Normal. Negative for fracture or focal lesion. Sinuses/Orbits: Visualized orbits show no acute finding. No significant paranasal sinus disease at the imaged levels. Other: Mild right anterior scalp soft tissue swelling is questioned. CT CERVICAL SPINE FINDINGS Mildly motion degraded exam. Alignment: Straightening of the expected cervical lordosis. No significant spondylolisthesis. Skull base and vertebrae: The basion-dental and atlanto-dental intervals are maintained.No evidence of acute fracture to the cervical spine. Soft tissues and spinal canal: No prevertebral fluid or swelling. No visible canal hematoma. Disc levels: Cervical spondylosis with multilevel disc space narrowing, central disc protrusions, disc bulges, uncovertebral hypertrophy and facet arthrosis. Multilevel spinal canal stenosis. Most notably, a disc bulge at C3-C4 contributes to suspected mild/moderate spinal canal stenosis. Multilevel bony neural foraminal narrowing. Upper chest: No consolidation within the imaged lung apices. No visible pneumothorax. Other: Heterogeneous enhancement of the thyroid gland. Additionally, there is asymmetric prominence of the left thyroid lobe. Multiple thyroid nodules. Dominant left thyroid lobe nodule measuring up to 1.6 cm (series 5, image 57). IMPRESSION: CT head: 1. Mildly motion degraded exam. 2. No evidence of acute intracranial abnormality. 3. Mild right anterior scalp soft tissue swelling is questioned. 4. Redemonstrated mild generalized parenchymal atrophy and advanced chronic small vessel ischemic disease. CT cervical spine: 1. Mildly motion degraded exam. 2. No evidence of  acute fracture to the cervical spine. 3. Nonspecific straightening of the expected cervical lordosis. 4. Cervical spondylosis, as described. 5. Thyroid nodules, the largest measuring 1.6 cm within the left lobe. Nonemergent thyroid ultrasound is recommended for further evaluation. Electronically Signed   By: Jackey Loge DO   On: 01/21/2021 11:18   DG Chest Port 1 View  Result Date: 01/21/2021 CLINICAL DATA:  Altered mental status EXAM: PORTABLE CHEST 1 VIEW COMPARISON:  December 15, 2020 FINDINGS: There is slight left base atelectasis. Lungs elsewhere clear. Heart is mildly enlarged with pulmonary vascularity within normal limits. There is prominence of the aorta, a stable finding. No adenopathy. No bone lesions. IMPRESSION: Slight left base atelectasis. Lungs elsewhere clear. Stable cardiac prominence. Prominence of the aorta is a stable finding which likely is due to chronic hypertension. Electronically Signed   By: Bretta Bang III M.D.   On: 01/21/2021 10:57    Procedures .Critical Care Performed by: Paris Lore, PA-C Authorized by: Paris Lore, PA-C   Critical care provider statement:    Critical care time (minutes):  45   Critical care was necessary to treat or prevent imminent or life-threatening deterioration of the following conditions:  Sepsis (Encephalopathy)   Critical care was time spent personally by me on the following activities:  Discussions with consultants, evaluation of patient's response to treatment, examination of patient, ordering and performing treatments and interventions, ordering and review of laboratory studies, ordering and review of radiographic studies, pulse oximetry, re-evaluation of patient's condition, obtaining history from patient or surrogate and review of old charts    Medications Ordered in ED Medications  sodium chloride 0.9 % bolus 1,000 mL (0 mLs Intravenous Stopped 01/21/21 1347)    And  0.9 %  sodium chloride infusion (  Intravenous  New Bag/Given 01/21/21 1622)  haloperidol lactate (HALDOL) 5 MG/ML injection (  Not Given 01/21/21 1111)  enoxaparin (LOVENOX) injection 40 mg (40 mg Subcutaneous Given 01/21/21 1625)  sodium chloride flush (NS) 0.9 % injection 3 mL (3 mLs Intravenous Given 01/21/21 1632)  acetaminophen (TYLENOL) tablet 650 mg (has no administration in time range)    Or  acetaminophen (TYLENOL) suppository 650 mg (has no administration in time range)  albuterol (PROVENTIL) (2.5 MG/3ML) 0.083% nebulizer solution 2.5 mg (has no administration in time range)  cefTRIAXone (ROCEPHIN) 2 g in sodium chloride 0.9 % 100 mL IVPB (has no administration in time range)  dextrose 10 % IV infusion (has no administration in time range)  hydrALAZINE (APRESOLINE) injection 10 mg (10 mg Intravenous Given 01/21/21 1731)  midazolam (VERSED) injection 2 mg (2 mg Intramuscular Not Given 01/21/21 0949)  cefTRIAXone (ROCEPHIN) 2 g in sodium chloride 0.9 % 100 mL IVPB (0 g Intravenous Stopped 01/21/21 1300)  LORazepam (ATIVAN) injection 1 mg (1 mg Intravenous Given 01/21/21 1128)  sodium chloride 0.9 % bolus 1,000 mL (0 mLs Intravenous Stopped 01/21/21 1347)  dextrose 50 % solution (50 mLs  Given 01/21/21 1619)    ED Course  I have reviewed the triage vital signs and the nursing notes.  Pertinent labs & imaging results that were available during my care of the patient were reviewed by me and considered in my medical decision making (see chart for details).    MDM Rules/Calculators/A&P                          70 year old female presents via EMS for altered mental status after being found down in her apartment this morning.  The differential diagnosis for AMS is extensive and includes, but is not limited to:  Marland Kitchen Drug overdose - opioids, alcohol, sedatives, antipsychotics, drug withdrawal, others . Metabolic: hypoxia, hypoglycemia, hyperglycemia, hypercalcemia, hypernatremia, hyponatremia, uremia, hepatic encephalopathy,  hypothyroidism, hyperthyroidism, vitamin B12 or thiamine deficiency, carbon monoxide poisoning, Wilson's disease, Lactic acidosis, DKA/HHOS . Infectious: meningitis, encephalitis, bacteremia/sepsis, urinary tract infection, pneumonia, neurosyphilis . Structural: Space-occupying lesion, (brain tumor, subdural hematoma, hydrocephalus,) . Vascular: stroke, subarachnoid hemorrhage, coronary ischemia, hypertensive encephalopathy, CNS vasculitis, thrombotic thrombocytopenic purpura, disseminated intravascular coagulation, hyperviscosity . Psychiatric: Schizophrenia, depression; Other: Seizure, hypothermia, heat stroke, ICU psychosis, dementia -"sundowning."   Hypothermic, hypotensive, and tachypneic on intake.  Cardiopulmonary exam complicated by patient's agitation, however does not appear to be acute abnormality in her heart rhythm, no murmurs, normal air movement throughout lungs bilaterally.  Abdominal exam is benign, abdomen is soft and nondistended.  Patient is neurovascular intact in all 4 extremities.  Patient did require dose of Versed and also Haldol in order to be calm and stable enough to perform CT scan of the head.  She is sedated and sleeping at this time.  Additionally multiple attempts were made by attending physician to secure ultrasound-guided IV in this patient.  CBC with leukocytosis of 12,000, CMP with baseline creatinine.  CK mildly elevated to 281, lactic acid elevated at 2.8.  UA with positive nitrites, large leukocytes, many bacteria-greater than 50 WBCs, concerning for urinary tract infection.  CT of the head negative for acute renal abnormality, CT of the cervical spine also negative for acute fracture or dislocation.  There is a thyroid nodule that may be better evaluated by nonemergent thyroid ultrasound.  IV antibiotics with Rocephin ordered, consult placed to hospitalist, Dr. Katrinka Blazing for admission by attending ED physician.   This  chart was dictated using voice recognition  software, Dragon. Despite the best efforts of this provider to proofread and correct errors, errors may still occur which can change documentation meaning.  Final Clinical Impression(s) / ED Diagnoses Final diagnoses:  Encephalopathy acute  Acute cystitis without hematuria  Sepsis, due to unspecified organism, unspecified whether acute organ dysfunction present [facility])    Rx / DC Orders ED Discharge Orders    None       Paris Lore, PA-C 01/21/21 1822    Virgina Norfolk, DO 01/21/21 1846

## 2021-01-21 NOTE — ED Triage Notes (Signed)
Favoring right side, unknown down time, pearl, withdraws to pain

## 2021-01-21 NOTE — Consult Note (Signed)
Attending:   I have seen and examined the patient independently and have discussed her care with the internal medicine resident, Dr. Kirke Corin.  Subjective: Thi sis a 70 y/o female with a complex past medical history who was found down today and brought to the ER.  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  She has a history of multiple admissions in the setting of acute metabolic encephalopathy from polypharmacy, urinary retention, and hypoglycemia.  Her daughter reports a history of recurrent UTI.  Of note, she has been admitted 3 times in 2022 already, typically with some level of urinary retention, acute kidney injury at play. She was supposed to see urology as an outpatient, unclear if that has been done yet or not.  None of the recent admissions note UTI.  Her daughter says that the patient takes the medicines that are in her pill container (which the daughter prepares), she is no longer taking doxepin, but she doesn't know if she is taking anything else like benadryl.  Objective: Vitals:   01/21/21 1230 01/21/21 1245 01/21/21 1616 01/21/21 1731  BP: (!) 143/102 (!) 164/113 (!) 163/112 (!) 169/103  Pulse: 83 64    Resp: 11 12 17    Temp:   99.6 F (37.6 C)   TempSrc:   Rectal   SpO2: 97% 98% 97%   Weight:      Height:          Intake/Output Summary (Last 24 hours) at 01/21/2021 1755 Last data filed at 01/21/2021 1632 Gross per 24 hour  Intake 2103 ml  Output --  Net 2103 ml    General:  Resting comfortably in bed HENT: NCAT OP clear, dry mucus membranes, pupils dilated PULM: CTA B, normal effort CV: RRR, no mgr GI: BS+, soft, nontender MSK: normal bulk and tone Neuro: intermittently agitated, doesn't speak, will flex to stimulation, intermittently pushes away from staff   CBC    Component Value Date/Time   WBC 12.2 (H) 01/21/2021 0948   RBC 3.88 01/21/2021 0948    HGB 12.6 01/21/2021 1213   HCT 37.0 01/21/2021 1213   PLT 233 01/21/2021 0948   MCV 94.1 01/21/2021 0948   MCH 30.4 01/21/2021 0948   MCHC 32.3 01/21/2021 0948   RDW 14.2 01/21/2021 0948   LYMPHSABS 1.5 01/21/2021 0948   MONOABS 0.9 01/21/2021 0948   EOSABS 0.1 01/21/2021 0948   BASOSABS 0.0 01/21/2021 0948    BMET    Component Value Date/Time   NA 143 01/21/2021 1213   K 3.6 01/21/2021 1213   CL 105 01/21/2021 0948   CO2 28 01/21/2021 0948   GLUCOSE 117 (H) 01/21/2021 0948   BUN 31 (H) 01/21/2021 0948   CREATININE 1.79 (H) 01/21/2021 0948   CALCIUM 9.8 01/21/2021 0948   GFRNONAA 31 (L) 01/21/2021 0948   GFRAA 46 (L) 05/11/2020 05/13/2020     Impression/Plan: Acute metabolic encephalopathy> UTI and hypoglycemia are reasonable explanations, but her dilated pupils, dry mucus membranes, and dilated bladder are worrisome for an anticholinergic syndrome.  Her daughter claims that she has not been taking doxepin or anti-emetics at home. Seroquel has a relatively low risk of causing anticholinergic syndrome.  Given haldol downstairs which probably made things worse. She has not had treatment for UTI for several hours as she lost IV access until when I walked in the room 20 minutes ago.  > give IV fluids and D10 now as  you are doing > give antibiotics as you are doing > frequent orientation > no sedating meds > we will see her again  > avoid anticholinergics > we will see her again tonight to make sure she is turning around, if not may need to go to the ICU and trial physostigmine.  My cc time 35 minutes  Heber [place], MD [facility] Pager: [phone] Cell: [phone] After 3pm or if no response, call [phone]

## 2021-01-21 NOTE — Progress Notes (Signed)
[facility] PROGRESS NOTE  Subjective/Interval: 83 female admitted earlier today with AMS of uncertain etiology. Treatment started with IV fluids, antibiotics for UTI, D10 infusion for hyponatremia. She exhibited excited delirium in the ED and was treated with benzo and anti-psychotics. She then became less responsive, which is why I was called to see her at around 2100. Upon my arrival she is obtunded. Will minimally open eyes to pain and has Cheyne stokes respirations.   O: BP (!) 181/99   Pulse 86   Temp 99.7 F (37.6 C) (Axillary)   Resp 15   Ht 5\' 7"  (1.702 m)   Wt 86.7 kg   SpO2 100%   BMI 29.94 kg/m   General:  Elderly appearing adult female in NAD Neuro:  Obtunded. Barely opens eyes to sternal rub.  HEENT:  [place]/AT, pupils dilated and equally reactive, no JVD Cardiovascular:  RRR, no MRG Lungs:  Clear Abdomen:  Soft, non-tender, non-distended Musculoskeletal:  No acute deformity Skin:  Intact, MMM   A/P: When I initially went to see her and she was minimally responsive even after hypoglycemia was treated and she had gotten some volume. I decided to transfer her to the ICU and potentially move towards intubation. Upon arrival to the ICU she was much more awake and having further delirium. She was combative with staff and attempting to pull her foley out, ultimately requiring soft restraints. There is concern for anti-cholinergic toxicity due to her constellation of symptoms dilated pupils, dry mucus membranes, agitation, and dilated bladder as well has her uncertain medication record and recent polypharmacy issues.   UTI Hypoglycemia Agitated delirium  - Transfer to ICU for closer monitoring.  - Precedex infusion for RASS goal 0 to -1 - Consider MRI brain to rule out PRES, SBP has been 160s to 180s.  - PRN hydralazine to keep SBP < 160 mmHg - Continue ABX for UTI - D10 infusion and CBG checks - Avoid anticholinergics, consider trial of physostigmine. Will discuss with  attending.   Critical care time 45 mins   , ACNP [facility] Pulmonology/Critical Care Pager [phone] or [phone]

## 2021-01-21 NOTE — ED Notes (Signed)
Not able to complete NIHSS, bear hugger placed on pt, pt moving randomly on stretcher, not able to place IV

## 2021-01-21 NOTE — H&P (Signed)
History and Physical    Michelle Graves:[id number] DOB: 12/21/1950 DOA: 01/21/2021  Referring MD/NP/PA: Virgina Norfolk, MD PCP: [facility]  Patient coming from: Home via EMS  Chief Complaint: Unresponsive  I have personally briefly reviewed patient's old medical records in [facility]   HPI: Michelle Graves is a 70 y.o. female with medical history significant of hypertension, CVA in 2020, anemia, rheumatoid arthritis, and chronic kidney disease stage IIIb presents after being found unresponsive on the floor.  History is obtained from review of records and the daughter over the phone.  Her daughter had seen her around 12 noon yesterday, and last talked her around 5PM yesterday evening.  At that time patient had been in her normal state and complained of being tired.  However, after being her daughter was unable to reach her later that night and this morning she called the apartment complex to do a welfare check.  They found the patient on the floor and reported that she had vomited.  Patient's daughter reports that is not unusual for the patient to get confused when she has a urinary tract infection.  Patient was just recently hospitalized from 2/19-2/23 with acute metabolic encephalopathy thought to be multifactorial including hypoglycemia, CVA, and/or medications.  Patient was noted to have left lateral cerebellum infarct on MRI. lso  Awas noted to have abnormal urine cultures that grew out staph epididymis that was thought to be asymptomatic and not treated.   ED Course: Upon admission into the emergency department patient was seen to be afebrile, pulse 56-88, respirations 10-22, blood pressure 136/98-171/125, and O2 saturations 81- 100% on 2 L nasal cannula oxygen CT head and cervical spine showed no acute abnormality.  Labs significant for WBC 12.2, hemoglobin 11.8, BUN 31, creatinine 1.79, and CK 281.  Urinalysis was positive for large leukocytes,  positive nitrites, many bacteria, and greater than 50 WBCs.  Chest xray was otherwise clear. UDS was negative.  She initially required Ativan and Versed as she was acutely altered and taking out IVs. A venous blood gas have been obtained due to patient being significantly lethargic, but patient was not noted to be retaining CO2. Patient has been given 2 L of IV fluids and Rocephin for urinary tract infection.    Review of Systems  Unable to perform ROS: Mental status change    Past Medical History:  Diagnosis Date  . Acute on chronic kidney failure (HCC)   . Anemia   . Arthritis   . Chest pain   . Diverticulitis   . Hypertension   . Stroke (HCC)   . UTI (urinary tract infection) 10/2019    Past Surgical History:  Procedure Laterality Date  . BIOPSY  04/01/2019   Procedure: BIOPSY;  Surgeon: Kathi Der, MD;  Location: [facility] ENDOSCOPY;  Service: Gastroenterology;;  . BIOPSY  10/05/2019   Procedure: BIOPSY;  Surgeon: Kathi Der, MD;  Location: [facility] ENDOSCOPY;  Service: Gastroenterology;;  . BIOPSY  05/10/2020   Procedure: BIOPSY;  Surgeon: Kathi Der, MD;  Location: [facility] ENDOSCOPY;  Service: Gastroenterology;;  . BIOPSY  11/29/2020   Procedure: BIOPSY;  Surgeon: Charlott Rakes, MD;  Location: [facility] ENDOSCOPY;  Service: Gastroenterology;;  . COLONOSCOPY N/A 11/29/2020   Procedure: COLONOSCOPY;  Surgeon: Charlott Rakes, MD;  Location: [facility] ENDOSCOPY;  Service: Gastroenterology;  Laterality: N/A;  . COLONOSCOPY WITH PROPOFOL N/A 04/01/2019   Procedure: COLONOSCOPY WITH PROPOFOL;  Surgeon: Kathi Der, MD;  Location: [facility] ENDOSCOPY;  Service: Gastroenterology;  Laterality: N/A;  . COLONOSCOPY WITH PROPOFOL N/A 10/05/2019   Procedure: COLONOSCOPY WITH PROPOFOL;  Surgeon: Kathi Der, MD;  Location: [facility] ENDOSCOPY;  Service: Gastroenterology;  Laterality: N/A;  . COLONOSCOPY WITH PROPOFOL N/A 05/10/2020   Procedure: COLONOSCOPY WITH PROPOFOL;  Surgeon: Kathi Der, MD;   Location: [facility] ENDOSCOPY;  Service: Gastroenterology;  Laterality: N/A;  . ESOPHAGOGASTRODUODENOSCOPY (EGD) WITH PROPOFOL N/A 04/01/2019   Procedure: ESOPHAGOGASTRODUODENOSCOPY (EGD) WITH PROPOFOL;  Surgeon: Kathi Der, MD;  Location: [facility] ENDOSCOPY;  Service: Gastroenterology;  Laterality: N/A;  . ESOPHAGOGASTRODUODENOSCOPY (EGD) WITH PROPOFOL N/A 10/05/2019   Procedure: ESOPHAGOGASTRODUODENOSCOPY (EGD) WITH PROPOFOL;  Surgeon: Kathi Der, MD;  Location: [facility] ENDOSCOPY;  Service: Gastroenterology;  Laterality: N/A;  . POLYPECTOMY  11/29/2020   Procedure: POLYPECTOMY;  Surgeon: Charlott Rakes, MD;  Location: [facility] ENDOSCOPY;  Service: Gastroenterology;;  . Sunnie Nielsen TATTOO INJECTION  11/29/2020   Procedure: SUBMUCOSAL TATTOO INJECTION;  Surgeon: Charlott Rakes, MD;  Location: [facility] ENDOSCOPY;  Service: Gastroenterology;;  . TONSILLECTOMY       reports that she has never smoked. She has never used smokeless tobacco. She reports previous alcohol use. She reports that she does not use drugs.  No Known Allergies  Family History  Problem Relation Age of Onset  . Hypertension Mother   . Diabetes Mother   . CAD Father        died of MI at age 34  . Hypertension Father   . Diabetes Sister   . Diabetes Sister   . Kidney disease Neg Hx     Prior to Admission medications   Medication Sig Start Date End Date Taking? Authorizing Provider  aspirin 81 MG EC tablet Take 1 tablet (81 mg total) by mouth daily. 01/03/21 01/03/22  Fargo, Amy E, NP  atorvastatin (LIPITOR) 40 MG tablet Take 1 tablet (40 mg total) by mouth daily. 01/03/21 04/03/21  Octavia Heir, NP  carvedilol (COREG) 25 MG tablet Take 1 tablet (25 mg total) by mouth 2 (two) times daily. 01/03/21   Fargo, Amy E, NP  cloNIDine (CATAPRES) 0.3 MG tablet Take 1 tablet (0.3 mg total) by mouth 3 (three) times daily. 01/03/21   Fargo, Amy E, NP  diltiazem (CARDIZEM CD) 180 MG 24 hr capsule Take 1 capsule (180 mg total) by mouth 2 (two) times daily.  01/03/21   Fargo, Amy E, NP  docusate sodium (COLACE) 100 MG capsule Take 1 capsule (100 mg total) by mouth 2 (two) times daily. 12/01/20   Barnetta Chapel, MD  DULoxetine (CYMBALTA) 60 MG capsule Take 1 capsule (60 mg total) by mouth at bedtime. 01/03/21   Fargo, Amy E, NP  Ensure (ENSURE) Take 237 mLs by mouth daily in the afternoon. Vanilla    [provider]  hydroxychloroquine (PLAQUENIL) 200 MG tablet Take 1 tablet (200 mg total) by mouth daily. 01/03/21   Fargo, Amy E, NP  isosorbide mononitrate (IMDUR) 30 MG 24 hr tablet Take 1 tablet (30 mg total) by mouth daily. 01/03/21   Fargo, Amy E, NP  lactulose (CHRONULAC) 10 GM/15ML solution Take 30 mLs (20 g total) by mouth 2 (two) times daily. 01/03/21   Octavia Heir, NP  Multiple Vitamin (MULTIVITAMIN WITH MINERALS) TABS tablet Take 1 tablet by mouth daily. 06/03/19   Shahmehdi, Gemma Payor, MD  MYRBETRIQ 25 MG TB24 tablet Take 1 tablet (25 mg total) by mouth daily. 01/03/21   Fargo, Amy E, NP  pantoprazole (PROTONIX) 40 MG tablet Take 1 tablet (40 mg total) by mouth daily.  01/03/21   Fargo, Amy E, NP  polyethylene glycol (MIRALAX / GLYCOLAX) 17 g packet Take 17 g by mouth daily as needed for mild constipation. 12/11/20   Glade Lloyd, MD  predniSONE (DELTASONE) 5 MG tablet Take 1 tablet (5 mg total) by mouth daily. 01/03/21   Fargo, Amy E, NP  QUEtiapine (SEROQUEL) 100 MG tablet Take 1 tablet (100 mg total) by mouth daily. 01/03/21   Fargo, Amy E, NP  tamsulosin (FLOMAX) 0.4 MG CAPS capsule Take 1 capsule (0.4 mg total) by mouth daily after supper. 01/03/21   Octavia Heir, NP    Physical Exam:  Constitutional: Lethargic elderly female with sonorous respirations not following command Vitals:   01/21/21 1130 01/21/21 1215 01/21/21 1230 01/21/21 1245  BP: (!) 155/123 (!) 136/98 (!) 143/102 (!) 164/113  Pulse: 65 84 83 64  Resp: Temp:      TempSrc:      SpO2: 98% 98% 97% 98%  Weight:      Height:       Eyes: PERRL, lids and  conjunctivae normal ENMT: Mucous membranes are moist. Posterior pharynx clear of any exudate or lesions.  Neck: normal, supple, no masses, no thyromegaly.  No stridor. Respiratory: clear to auscultation bilaterally, no wheezing, no crackles. Normal respiratory effort. No accessory muscle use.  Cardiovascular: Regular rate and rhythm, no murmurs / rubs / gallops. No extremity edema. 2+ pedal pulses. No carotid bruits.  Abdomen: no tenderness, no masses palpated. No hepatosplenomegaly. Bowel sounds positive.  Musculoskeletal: no clubbing / cyanosis. No joint deformity upper and lower extremities. Good ROM, no contractures. Normal muscle tone.  Skin: abrasion to the right side of face. Neurologic: CN 2-12 grossly intact.  Appears to move extremities Psychiatric: Unable to assess.    Labs on Admission: I have personally reviewed following labs and imaging studies  CBC: Recent Labs  Lab 01/21/21 0948 01/21/21 1213  WBC 12.2*  --   NEUTROABS 9.6*  --   HGB 11.8* 12.6  HCT 36.5 37.0  MCV 94.1  --   PLT 233  --    Basic Metabolic Panel: Recent Labs  Lab 01/21/21 0948 01/21/21 1213  NA 142 143  K 4.2 3.6  CL 105  --   CO2 28  --   GLUCOSE 117*  --   BUN 31*  --   CREATININE 1.79*  --   CALCIUM 9.8  --    GFR: Estimated Creatinine Clearance: 34 mL/min (A) (by C-G formula based on SCr of 1.79 mg/dL (H)). Liver Function Tests: Recent Labs  Lab 01/21/21 0948  AST 27  ALT 16  ALKPHOS 88  BILITOT 0.7  PROT 7.5  ALBUMIN 4.0   No results for input(s): LIPASE, AMYLASE in the last 168 hours. No results for input(s): AMMONIA in the last 168 hours. Coagulation Profile: Recent Labs  Lab 01/21/21 0948  INR 1.0   Cardiac Enzymes: Recent Labs  Lab 01/21/21 0948  CKTOTAL 281*   BNP (last 3 results) No results for input(s): PROBNP in the last 8760 hours. HbA1C: No results for input(s): HGBA1C in the last 72 hours. CBG: Recent Labs  Lab 01/21/21 0959  GLUCAP 93    Lipid Profile: No results for input(s): CHOL, HDL, LDLCALC, TRIG, CHOLHDL, LDLDIRECT in the last 72 hours. Thyroid Function Tests: No results for input(s): TSH, T4TOTAL, FREET4, T3FREE, THYROIDAB in the last 72 hours. Anemia Panel: No results for input(s): VITAMINB12, FOLATE, FERRITIN, TIBC, IRON, RETICCTPCT in  the last 72 hours. Urine analysis:    Component Value Date/Time   COLORURINE AMBER (A) 01/21/2021 0948   APPEARANCEUR CLOUDY (A) 01/21/2021 0948   LABSPEC 1.018 01/21/2021 0948   PHURINE 6.0 01/21/2021 0948   GLUCOSEU NEGATIVE 01/21/2021 0948   HGBUR NEGATIVE 01/21/2021 0948   BILIRUBINUR NEGATIVE 01/21/2021 0948   KETONESUR NEGATIVE 01/21/2021 0948   PROTEINUR 100 (A) 01/21/2021 0948   UROBILINOGEN 1.0 06/24/2010 1850   NITRITE POSITIVE (A) 01/21/2021 0948   LEUKOCYTESUR LARGE (A) 01/21/2021 0948   Sepsis Labs: No results found for this or any previous visit (from the past 240 hour(s)).   Radiological Exams on Admission: CT HEAD WO CONTRAST  Result Date: 01/21/2021 CLINICAL DATA:  Altered mental status. Additional history provided: Patient found down on floor at home, unknown down time, patient favoring right side, confusion, agitated. EXAM: CT HEAD WITHOUT CONTRAST CT CERVICAL SPINE WITHOUT CONTRAST TECHNIQUE: Multidetector CT imaging of the head and cervical spine was performed following the standard protocol without intravenous contrast. Multiplanar CT image reconstructions of the cervical spine were also generated. COMPARISON:  Brain MRI 12/15/2020. cervical spine radiographs 02/14/2009. FINDINGS: CT HEAD FINDINGS Brain: Mildly motion degraded exam. Mild generalized parenchymal atrophy. Redemonstrated chronic small-vessel infarcts within the right corona radiata and within the deep gray nuclei. Background advanced patchy and ill-defined hypoattenuation within the cerebral white matter is nonspecific, but also compatible with chronic small vessel ischemic disease. Known  small chronic infarcts within the bilateral cerebellar hemispheres were better appreciated on the prior brain MRI of 12/15/2020. There is no acute intracranial hemorrhage. No demarcated cortical infarct. No extra-axial fluid collection. No evidence of intracranial mass. No midline shift. Vascular: No hyperdense vessel.  Atherosclerotic calcifications Skull: Normal. Negative for fracture or focal lesion. Sinuses/Orbits: Visualized orbits show no acute finding. No significant paranasal sinus disease at the imaged levels. Other: Mild right anterior scalp soft tissue swelling is questioned. CT CERVICAL SPINE FINDINGS Mildly motion degraded exam. Alignment: Straightening of the expected cervical lordosis. No significant spondylolisthesis. Skull base and vertebrae: The basion-dental and atlanto-dental intervals are maintained.No evidence of acute fracture to the cervical spine. Soft tissues and spinal canal: No prevertebral fluid or swelling. No visible canal hematoma. Disc levels: Cervical spondylosis with multilevel disc space narrowing, central disc protrusions, disc bulges, uncovertebral hypertrophy and facet arthrosis. Multilevel spinal canal stenosis. Most notably, a disc bulge at C3-C4 contributes to suspected mild/moderate spinal canal stenosis. Multilevel bony neural foraminal narrowing. Upper chest: No consolidation within the imaged lung apices. No visible pneumothorax. Other: Heterogeneous enhancement of the thyroid gland. Additionally, there is asymmetric prominence of the left thyroid lobe. Multiple thyroid nodules. Dominant left thyroid lobe nodule measuring up to 1.6 cm (series 5, image 57). IMPRESSION: CT head: 1. Mildly motion degraded exam. 2. No evidence of acute intracranial abnormality. 3. Mild right anterior scalp soft tissue swelling is questioned. 4. Redemonstrated mild generalized parenchymal atrophy and advanced chronic small vessel ischemic disease. CT cervical spine: 1. Mildly motion degraded  exam. 2. No evidence of acute fracture to the cervical spine. 3. Nonspecific straightening of the expected cervical lordosis. 4. Cervical spondylosis, as described. 5. Thyroid nodules, the largest measuring 1.6 cm within the left lobe. Nonemergent thyroid ultrasound is recommended for further evaluation. Electronically Signed   By: Jackey Loge DO   On: 01/21/2021 11:18   CT CERVICAL SPINE WO CONTRAST  Result Date: 01/21/2021 CLINICAL DATA:  Altered mental status. Additional history provided: Patient found down on floor at home, unknown down  time, patient favoring right side, confusion, agitated. EXAM: CT HEAD WITHOUT CONTRAST CT CERVICAL SPINE WITHOUT CONTRAST TECHNIQUE: Multidetector CT imaging of the head and cervical spine was performed following the standard protocol without intravenous contrast. Multiplanar CT image reconstructions of the cervical spine were also generated. COMPARISON:  Brain MRI 12/15/2020. cervical spine radiographs 02/14/2009. FINDINGS: CT HEAD FINDINGS Brain: Mildly motion degraded exam. Mild generalized parenchymal atrophy. Redemonstrated chronic small-vessel infarcts within the right corona radiata and within the deep gray nuclei. Background advanced patchy and ill-defined hypoattenuation within the cerebral white matter is nonspecific, but also compatible with chronic small vessel ischemic disease. Known small chronic infarcts within the bilateral cerebellar hemispheres were better appreciated on the prior brain MRI of 12/15/2020. There is no acute intracranial hemorrhage. No demarcated cortical infarct. No extra-axial fluid collection. No evidence of intracranial mass. No midline shift. Vascular: No hyperdense vessel.  Atherosclerotic calcifications Skull: Normal. Negative for fracture or focal lesion. Sinuses/Orbits: Visualized orbits show no acute finding. No significant paranasal sinus disease at the imaged levels. Other: Mild right anterior scalp soft tissue swelling is  questioned. CT CERVICAL SPINE FINDINGS Mildly motion degraded exam. Alignment: Straightening of the expected cervical lordosis. No significant spondylolisthesis. Skull base and vertebrae: The basion-dental and atlanto-dental intervals are maintained.No evidence of acute fracture to the cervical spine. Soft tissues and spinal canal: No prevertebral fluid or swelling. No visible canal hematoma. Disc levels: Cervical spondylosis with multilevel disc space narrowing, central disc protrusions, disc bulges, uncovertebral hypertrophy and facet arthrosis. Multilevel spinal canal stenosis. Most notably, a disc bulge at C3-C4 contributes to suspected mild/moderate spinal canal stenosis. Multilevel bony neural foraminal narrowing. Upper chest: No consolidation within the imaged lung apices. No visible pneumothorax. Other: Heterogeneous enhancement of the thyroid gland. Additionally, there is asymmetric prominence of the left thyroid lobe. Multiple thyroid nodules. Dominant left thyroid lobe nodule measuring up to 1.6 cm (series 5, image 57). IMPRESSION: CT head: 1. Mildly motion degraded exam. 2. No evidence of acute intracranial abnormality. 3. Mild right anterior scalp soft tissue swelling is questioned. 4. Redemonstrated mild generalized parenchymal atrophy and advanced chronic small vessel ischemic disease. CT cervical spine: 1. Mildly motion degraded exam. 2. No evidence of acute fracture to the cervical spine. 3. Nonspecific straightening of the expected cervical lordosis. 4. Cervical spondylosis, as described. 5. Thyroid nodules, the largest measuring 1.6 cm within the left lobe. Nonemergent thyroid ultrasound is recommended for further evaluation. Electronically Signed   By: Jackey Loge DO   On: 01/21/2021 11:18   DG Chest Port 1 View  Result Date: 01/21/2021 CLINICAL DATA:  Altered mental status EXAM: PORTABLE CHEST 1 VIEW COMPARISON:  December 15, 2020 FINDINGS: There is slight left base atelectasis. Lungs  elsewhere clear. Heart is mildly enlarged with pulmonary vascularity within normal limits. There is prominence of the aorta, a stable finding. No adenopathy. No bone lesions. IMPRESSION: Slight left base atelectasis. Lungs elsewhere clear. Stable cardiac prominence. Prominence of the aorta is a stable finding which likely is due to chronic hypertension. Electronically Signed   By: Bretta Bang III M.D.   On: 01/21/2021 10:57    EKG: Independently reviewed.  Sinus rhythm at 92 bpm with QTC 523  Assessment/Plan Sepsis secondary to urinary tract infection: Acute.  Patient was noted to be initially mildly tachypneic with WBC 12.2.  Urinalysis was positive for signs of infection and initial lactic acid elevated at 2.8.  Urine culture from last hospitalization was positive for staph epididymis.  Prior cultures also  positive for E. coli that was also sensitive to Rocephin.  Patient has been started on empiric antibiotics of Rocephin. -Admit to a progressive bed -Follow-up blood and urine cultures -Empiric antibiotics of Rocephin -Trend lactic acid levels -Recheck CBC in a.m.  Acute metabolic encephalopathy: Patient was initially found on the floor noted to be acutely altered.  However, while in the ED patient had also received Ativan and Versed for agitation.  Suspecting likely multifactorial in the setting of suspected infection and hypoglycemia. -Neurochecks -Consider need to check repeat MRI  Hypoglycemia: Acute on chronic.  Glucose noted to drop down to 55 requiring patient be given an amp of D50, but was subsequently seen to drop back down to 63.  Symptoms previous  secondary to Plaquenil. -Hypoglycemic protocols -CBGs q. 4 hours -D10 IV fluids 75 mL/h  Recent CVA: Patient noted to have a tiny acute infarct of the left lateral cerebellum noted by MRI from 2/19. -May warrant repeat MRI of the brain if patient does not return back to her baseline  Fall: Patient was noted to have an  unwitnessed fall.  Down for unknown period in time.  Initial CK was 281. -Would benefit from PT/OT consults once more alert  Anemia of chronic disease: Hemoglobin 11.8 g/dL which appears near patient's baseline for -Continue to monitor  Prolonged QT interval: QTC 523   -Avoid QT prolonging medications  Hypertensive urgency: On admission blood pressures elevated over 171/125. -Hydralazine IV as needed -Restart home oral medications once able  Renal insufficiency/chronic kidney disease stage IIIb: Creatinine on admission elevated up to 1.79 with BUN 31.  Creatinine had been down to 1.59 prior to discharge.  Patient has history of urinary retention as well for which she previously required Foley catheter placement for -Check postvoid residuals  Rheumatoid arthritis  -Continue Plaquenil when able  History of colonic stricture: Patient had been diagnosed with a colonic stricture back in January. -Continue outpatient follow-up with GI  Thyroid nodules: Chronic. -Add on TSH  DVT prophylaxis: Lovenox Code Status: Full Family Communication: Daughter updated over the phone Disposition Plan: To be determined Consults called: PCCM Admission status: Inpatient more than 2 midnight stay due to concern for sepsis secondary to urinary tract infection.  Clydie Braun MD [facility]   If 7PM-7AM, please contact night-coverage   01/21/2021, 1:22 PM

## 2021-01-21 NOTE — Significant Event (Signed)
Rapid Response Event Note   Reason for Call : periodic breathing pattern Initial Focused Assessment:  Nursing staff called regarding pt with AMS with some periods of apnea but not dropping her oxygen saturations. Upon my arrival, pt is warm to the touch, arousable to loud voice and oriented to self.  Flushed, dry mucous membranes, low grade fever (99.42F). Pt is purposeful when stimulated and moves all extremities equally. PERRLA at 4 sluggishly reacting. Pt is currently restrained due to reportedly disrupting medical devices/safety. BBS diminished. She does have tachypneic episodes followed by apnea episodes lasting 10-20 seconds while I was at the bedside. CBG 116. Pt may need transfer to ICU.  I contacted PCCM for re-evaluation and they came to the bedside. ABG ordered.   2100-99.42F, Hr 82 NSR, 181/99 (120), RR 15, sats 100% on 2L Grandfather.   Interventions:  -ETCo2 monitoring  -Tx ICU   MD Notified: PCCM Call Time: 1927 Arrival Time: 1935 End Time: 2230  Rose Fillers, RN

## 2021-01-21 NOTE — ED Notes (Signed)
Called and spoke to lab to run urine now

## 2021-01-21 NOTE — Consult Note (Signed)
NAME:  Michelle Graves, MRN:  [id number], DOB:  [birth year], LOS: 0 ADMISSION DATE:  01/21/2021, CONSULTATION DATE:  01/21/2021 REFERRING MD:  Virgina Norfolk, CHIEF COMPLAINT:  AMS  History of Present Illness:  Michelle Graves is a 70 y.o. female w/ PMh of CVA in 2020, HTN, CKD stage 3b and recurrent UTIs, and rheumatoid arthritis who presented to the ED via EMS from her government housing after being found unresponsive on the floor. According to daughter, she last talked with patient around 5PM yesterday evening.  At that time patient had been in her normal state and complained of being tired. At baseline, she is able to care for herself and ambulate in her own home. They checked on patient this morning and they found the patient on the floor and reported that she had vomited. Patient's daughter reports that is not unusual for the patient to get confused when she has a urinary tract infection. Patient was just recently hospitalized from 2/19-2/23 with acute metabolic encephalopathy thought to be multifactorial including hypoglycemia, CVA, and/or polypharmacy. Patient was noted to have left lateral cerebellum infarct her most recent brain MRI. During last hospitalization, urine culture grew staph epididymis but patient was not treated due to being asymptomatic.   In the ED, she was found to be slightly tachypneic and hypertensive but otherwise afebrile with appropriate O2 sats on 2 L nasal cannula.  Urinalysis was positive for large leukocytes, positive nitrites, many bacteria, and greater than 50 WBCs.  CT head/cervical spine with no acute abnormalities.  Mild leukocytosis, normal hemoglobin and mild AKI with a creatinine of 1.79, baseline of 1.4-1.5.  Negative UDS, normal ammonia.  Lactate slightly elevated to 2.8.  Normal chest x-ray.  Patient became agitated and started taking her IV she was given Ativan and Versed.  She was started on IV fluids and Rocephin for UTI.  PCCM will consult for her altered  mental status.   Pertinent  Medical History   Past Medical History:  Diagnosis Date  . Acute on chronic kidney failure (HCC)   . Anemia   . Arthritis   . Chest pain   . Diverticulitis   . Hypertension   . Stroke (HCC)   . UTI (urinary tract infection) 10/2019    Significant Hospital Events: Including procedures, antibiotic start and stop dates in addition to other pertinent events   . 3/28 CT Head >> No evidence of acute intracranial abnormality . 3/28 CT Cervical spine >> Cervical spondylosis but no evidence of acute fracture to the cervical spine . Rocephin 3/28 >>  Interim History / Subjective:  Patient found to be somnolent during encounter Problem with IV access  Objective   Blood pressure (!) 163/112, pulse 64, temperature 99.6 F (37.6 C), temperature source Rectal, resp. rate 17, height 5\' 7"  (1.702 m), weight 86.5 kg, SpO2 97 %.        Intake/Output Summary (Last 24 hours) at 01/21/2021 1730 Last data filed at 01/21/2021 1632 Gross per 24 hour  Intake 2103 ml  Output --  Net 2103 ml   Filed Weights   01/21/21 0924  Weight: 86.5 kg    Examination: General: Somnolent elderly female laying in bed with mild agitation HENT: [place]/AT. PERRLA.  Dry mucous membrane. Lungs: CTAB.  No wheezing or rales.  Normal effort. Cardiovascular: RRR. No m/r/g. No LE edema Abdomen: Soft.  NT/ND.  Normal bowel sounds. Extremities: Well perfused. Normal range of motion. Skin: Warm and dry.  Small abrasion to the right  side of face underneath the R eye Neuro: Somnolent. Responds to voice but does not follow commands. Moves all extremities.  Psych: Mild agitation.  Labs/imaging that I havepersonally reviewed  (right click and "Reselect all SmartList Selections" daily)  Lactic acid 2.8, ammonia 27, CK 281, UDS negative, normal PT/INR Mild leukocytosis 12.2, hemoglobin 11.8, creatinine 1.79,  Urinalysis shows proteinuria, positive nitrate, large leukocytes, >50 WBC and many  bacteria. Checks x-ray with slight left base atelectasis CT head/cervical spine unremarkable  Resolved Hospital Problem list     Assessment & Plan:  Acute metabolic encephalopathy Hypoglycemia Patient found to be acutely altered after being found down in apartment.  Patient became agitated in the ED requiring Haldol and Versed. Encephalopathy likely multifactorial in the setting of patient's hypoglycemia and infection.  Anticholinergic syndrome is also a possibility in the setting of patient's dry mucous membrane, dilated bladder and dilated pupils.  P: --IV fluids and IV antibiotics --D10 drip for hypoglycemia --Frequent neuro checks --CBG monitoring --Avoid sedation/anticholinergic meds --Patient will need a med rec --PCCM plan to reevaluate patient tonight  Urosepsis Patient with a history of UTIs causing altered mental status found to have UTI on admission.  Mild leukocytosis but afebrile.  Unable to assess urinary symptoms secondary to patient's altered mental state. P: --IV Rocephin --Blood cultures x2 --Urine cultures --Trend lactate --Trend CBC, electrolytes  AKI with CKD 3b Baseline creatinine around 1.4-1.5. BUN/Cr 31/1.79.  Likely prerenal in the setting of dehydration after being found down for unknown duration.  Also consider element of urinary retention with patient's history of urinary retention requiring Foley placement during past hospitalizations. P: --IV fluids --Trend BMP, monitor electrolytes --Strict I&O's --Check postvoid residuals  Hypertension Patient found to be hypertensive with sBP in the 170s.  Unknown if patient has taken her antihypertensive meds. --PRN hydralazine IV --Swallow eval before starting home meds  Fall Patient found down for an unknown duration at apartment.  Moderately dehydrated.  CT cervical spine does not show any cervical fractures.  CT head.  No intracranial abnormality. --Consider MRI if encephalopathy does not improve with  initial treatments.  Anemia of chronic disease --Hemoglobin stable at 11.8  History of CVA MRI 2/19 showed a tiny acute infarction in the left lateral cerebellum likely subclinical.  No residual deficits. On aspirin at home --Continue ASA stable.  Best practice (right click and "Reselect all SmartList Selections" daily)  Diet:  NPO Pain/Anxiety/Delirium protocol (if indicated): Yes VAP protocol (if indicated): Not indicated DVT prophylaxis: Lovenox GI prophylaxis: N/A Glucose control: D10 Central venous access:  N/A Arterial line:  N/A Foley:  N/A Mobility:  OOB  PT consulted: N/A Last date of multidisciplinary goals of care discussion [Pending] Code Status:  full code Disposition: Progressive  Labs   CBC: Recent Labs  Lab 01/21/21 0948 01/21/21 1213  WBC 12.2*  --   NEUTROABS 9.6*  --   HGB 11.8* 12.6  HCT 36.5 37.0  MCV 94.1  --   PLT 233  --     Basic Metabolic Panel: Recent Labs  Lab 01/21/21 0948 01/21/21 1213  NA 142 143  K 4.2 3.6  CL 105  --   CO2 28  --   GLUCOSE 117*  --   BUN 31*  --   CREATININE 1.79*  --   CALCIUM 9.8  --    GFR: Estimated Creatinine Clearance: 34 mL/min (A) (by C-G formula based on SCr of 1.79 mg/dL (H)). Recent Labs  Lab 01/21/21 [phone] 01/21/21 1123  WBC 12.2*  --   LATICACIDVEN  --  2.8*    Liver Function Tests: Recent Labs  Lab 01/21/21 0948  AST 27  ALT 16  ALKPHOS 88  BILITOT 0.7  PROT 7.5  ALBUMIN 4.0   No results for input(s): LIPASE, AMYLASE in the last 168 hours. Recent Labs  Lab 01/21/21 1204  AMMONIA 27    ABG    Component Value Date/Time   PHART 7.511 (H) 03/29/2019 1516   PCO2ART 36.2 03/29/2019 1516   PO2ART 67.4 (L) 03/29/2019 1516   HCO3 29.4 (H) 01/21/2021 1213   TCO2 31 01/21/2021 1213   ACIDBASEDEF 4.8 (H) 03/25/2019 1808   O2SAT 51.0 01/21/2021 1213     Coagulation Profile: Recent Labs  Lab 01/21/21 0948  INR 1.0    Cardiac Enzymes: Recent Labs  Lab 01/21/21 0948   CKTOTAL 281*    HbA1C: Hgb A1c MFr Bld  Date/Time Value Ref Range Status  12/16/2020 02:23 AM 6.3 (H) 4.8 - 5.6 % Final    Comment:    (NOTE) Pre diabetes:          5.7%-6.4%  Diabetes:              >6.4%  Glycemic control for   <7.0% adults with diabetes   04/01/2019 04:51 AM 6.2 (H) 4.8 - 5.6 % Final    Comment:    (NOTE) Pre diabetes:          5.7%-6.4% Diabetes:              >6.4% Glycemic control for   <7.0% adults with diabetes     CBG: Recent Labs  Lab 01/21/21 0959 01/21/21 1551 01/21/21 1617 01/21/21 1631  GLUCAP 93 55* 74 63*    Past Medical History:  She,  has a past medical history of Acute on chronic kidney failure (HCC), Anemia, Arthritis, Chest pain, Diverticulitis, Hypertension, Stroke (HCC), and UTI (urinary tract infection) (10/2019).   Surgical History:   Past Surgical History:  Procedure Laterality Date  . BIOPSY  04/01/2019   Procedure: BIOPSY;  Surgeon: Kathi Der, MD;  Location: [facility] ENDOSCOPY;  Service: Gastroenterology;;  . BIOPSY  10/05/2019   Procedure: BIOPSY;  Surgeon: Kathi Der, MD;  Location[facility] ENDOSCOPY;  Service: Gastroenterology;;  . BIOPSY  05/10/2020   Procedure: BIOPSY;  Surgeon: Kathi Der, MD;  Location: [facility] ENDOSCOPY;  Service: Gastroenterology;;  . BIOPSY  11/29/2020   Procedure: BIOPSY;  Surgeon: Charlott Rakes, MD;  Location[facility]COPY;  Service: Gastroenterology;;  . COLONOSCOPY N/A 11/29/2020   Procedure: COLONOSCOPY;  Surgeon: Charlott Rakes, MD;  Location: [facility] ENDOSCOPY;  Service: Gastroenterology;  Laterality: N/A;  . COLONOSCOPY WITH PROPOFOL N/A 04/01/2019   Procedure: COLONOSCOPY WITH PROPOFOL;  Surgeon: Kathi Der, MD;  Location: [facility] ENDOSCOPY;  Service: Gastroenterology;  Laterality: N/A;  . COLONOSCOPY WITH PROPOFOL N/A 10/05/2019   Procedure: COLONOSCOPY WITH PROPOFOL;  Surgeon: Kathi Der, MD;  Location: [facility] ENDOSCOPY;  Service: Gastroenterology;  Laterality: N/A;  .  COLONOSCOPY WITH PROPOFOL N/A 05/10/2020   Procedure: COLONOSCOPY WITH PROPOFOL;  Surgeon: Kathi Der, MD;  Location: [facility] ENDOSCOPY;  Service: Gastroenterology;  Laterality: N/A;  . ESOPHAGOGASTRODUODENOSCOPY (EGD) WITH PROPOFOL N/A 04/01/2019   Procedure: ESOPHAGOGASTRODUODENOSCOPY (EGD) WITH PROPOFOL;  Surgeon: Kathi Der, MD;  Location: [facility] ENDOSCOPY;  Service: Gastroenterology;  Laterality: N/A;  . ESOPHAGOGASTRODUODENOSCOPY (EGD) WITH PROPOFOL N/A 10/05/2019   Procedure: ESOPHAGOGASTRODUODENOSCOPY (EGD) WITH PROPOFOL;  Surgeon: Kathi Der, MD;  Location: [facility] ENDOSCOPY;  Service: Gastroenterology;  Laterality: N/A;  .  POLYPECTOMY  11/29/2020   Procedure: POLYPECTOMY;  Surgeon: Charlott Rakes, MD;  Location: [facility] ENDOSCOPY;  Service: Gastroenterology;;  . Sunnie Nielsen TATTOO INJECTION  11/29/2020   Procedure: SUBMUCOSAL TATTOO INJECTION;  Surgeon: Charlott Rakes, MD;  Location: [facility] ENDOSCOPY;  Service: Gastroenterology;;  . TONSILLECTOMY       Social History:   reports that she has never smoked. She has never used smokeless tobacco. She reports previous alcohol use. She reports that she does not use drugs.   Family History:  Her family history includes CAD in her father; Diabetes in her mother, sister, and sister; Hypertension in her father and mother. There is no history of Kidney disease.   Allergies No Known Allergies   Home Medications  Prior to Admission medications   Medication Sig Start Date End Date Taking? Authorizing Provider  aspirin 81 MG EC tablet Take 1 tablet (81 mg total) by mouth daily. 01/03/21 01/03/22 Yes Fargo, Amy E, NP  atorvastatin (LIPITOR) 40 MG tablet Take 1 tablet (40 mg total) by mouth daily. 01/03/21 04/03/21 Yes Fargo, Amy E, NP  carvedilol (COREG) 25 MG tablet Take 1 tablet (25 mg total) by mouth 2 (two) times daily. 01/03/21  Yes Fargo, Amy E, NP  cloNIDine (CATAPRES) 0.3 MG tablet Take 1 tablet (0.3 mg total) by mouth 3 (three) times daily.  01/03/21  Yes Fargo, Amy E, NP  diltiazem (CARDIZEM CD) 180 MG 24 hr capsule Take 1 capsule (180 mg total) by mouth 2 (two) times daily. 01/03/21  Yes Fargo, Amy E, NP  docusate sodium (COLACE) 100 MG capsule Take 1 capsule (100 mg total) by mouth 2 (two) times daily. 12/01/20  Yes Berton Mount I, MD  DULoxetine (CYMBALTA) 60 MG capsule Take 1 capsule (60 mg total) by mouth at bedtime. 01/03/21  Yes Fargo, Amy E, NP  Ensure (ENSURE) Take 237 mLs by mouth daily in the afternoon. Vanilla   Yes [provider]  hydroxychloroquine (PLAQUENIL) 200 MG tablet Take 1 tablet (200 mg total) by mouth daily. 01/03/21  Yes Fargo, Amy E, NP  isosorbide mononitrate (IMDUR) 30 MG 24 hr tablet Take 1 tablet (30 mg total) by mouth daily. 01/03/21  Yes Fargo, Amy E, NP  lactulose (CHRONULAC) 10 GM/15ML solution Take 30 mLs (20 g total) by mouth 2 (two) times daily. Patient taking differently: Take 10 g by mouth 2 (two) times daily. 01/03/21  Yes Fargo, Amy E, NP  Multiple Vitamin (MULTIVITAMIN WITH MINERALS) TABS tablet Take 1 tablet by mouth daily. 06/03/19  Yes Shahmehdi, Seyed A, MD  pantoprazole (PROTONIX) 40 MG tablet Take 1 tablet (40 mg total) by mouth daily. 01/03/21  Yes Fargo, Amy E, NP  polyethylene glycol (MIRALAX / GLYCOLAX) 17 g packet Take 17 g by mouth daily as needed for mild constipation. 12/11/20  Yes Glade Lloyd, MD  QUEtiapine (SEROQUEL) 100 MG tablet Take 1 tablet (100 mg total) by mouth daily. 01/03/21  Yes Fargo, Amy E, NP     Critical care time: 25 min

## 2021-01-21 NOTE — ED Provider Notes (Signed)
I personally evaluated the patient during the encounter and completed a history, physical, procedures, medical decision making to contribute to the overall care of the patient and decision making for the patient briefly, the patient is a 70 y.o. female with history of stroke, UTI who presents the ED with altered mental status.  Found down at facility.  Unknown downtime.  Hypothermic and confused upon arrival.  Appears to be moving all extremities.  Not able to provide any history.  Level 5 caveat.  Broad-spectrum work-up initiated to rule out head bleed, metabolic processes such as electrolyte abnormality/rhabdomyolysis, also will initiate infectious work-up to evaluate for sepsis such as UTI.  Patient sedated with Haldol and Ativan given encephalopathy.  Urinalysis consistent with infection.  White count of 12.2.  Creatinine elevated to 1.79.  Head CT and neck CT unremarkable.  Overall suspect encephalopathy secondary to urinary tract infection.  Has a history of the same.  Has had history of encephalopathy with infections in the past per family.  If does not improve with fluids and antibiotics may need another MRI.  She had an MRI about a month ago that showed some tiny strokes.  This could be reactivation in the setting of infection.  Will talk to medicine about admission to stepdown unit.  Hemodynamically she is stable.  She is currently sedated at this time and think that she would be reasonable for stepdown unit.  .Critical Care Performed by: Virgina Norfolk, DO Authorized by: Virgina Norfolk, DO   Critical care provider statement:    Critical care time (minutes):  75   Critical care was necessary to treat or prevent imminent or life-threatening deterioration of the following conditions:  CNS failure or compromise and sepsis   Critical care was time spent personally by me on the following activities:  Blood draw for specimens, development of treatment plan with patient or surrogate, discussions with  primary provider, evaluation of patient's response to treatment, examination of patient, obtaining history from patient or surrogate, ordering and performing treatments and interventions, ordering and review of laboratory studies, ordering and review of radiographic studies, pulse oximetry, re-evaluation of patient's condition and review of old charts   I assumed direction of critical care for this patient from another provider in my specialty: no     EMERGENCY DEPARTMENT  US GUIDANCE EXAM Emergency Ultrasound:  US Guidance for Needle Guidance  INDICATIONS: Difficult vascular access Linear probe used in real-time to visualize location of needle entry through skin.   PERFORMED BY: Myself IMAGES ARCHIVED?: No LIMITATIONS: patient movement VIEWS USED: Transverse INTERPRETATION: Left arm  This chart was dictated using voice recognition software.  Despite best efforts to proofread,  errors can occur which can change the documentation meaning.    EKG Interpretation  Date/Time:  Monday January 21 2021 09:36:34 EDT Ventricular Rate:  92 PR Interval:    QRS Duration: 111 QT Interval:  422 QTC Calculation: 523 R Axis:   -4 Text Interpretation: Sinus rhythm Consider left atrial enlargement Prolonged QT interval Confirmed by Virgina Norfolk [phone]) on 01/21/2021 12:18:07 PM           Virgina Norfolk, DO 01/21/21 1218

## 2021-01-21 NOTE — Progress Notes (Addendum)
Pt arrived from ED somnolent, sonorous, using accessory muscles to breathe. BG 55, administered 1 amp D50. Pt responsive only to deep sternal rubbing/painful stimuli. Withdrawing & moving all 4 extremities non-purposefully. Bps 170s-190s systolic, given IV 10 mg hydralazine. Called rapid response for assistance as IVs were later infiltrated after D50 administration. Noted skin abrasion on forehead and edema in all 3 extremities with skin tears and bruising from lab draws. No family with patient at bedside when she arrived from ED but present at shift change for updates and able to fill in pieces of story on background and last known well time (17:00 01/20/21). Pupils 4 mm round and sluggish bilaterally and had some occasional decorticote-like posturing with her arms drawn in and fists clenched. Jaw clenched and patient had excess secretions, suction set up at bedside and patient suctioned frequently upon arrival and throughout rapid response evaluation. Pt did not appear to be protecting airway. She also had one episode of mild seizure-like activity with some jerking/twitching. Turned patient to her side. Episode lasted approximately 30 seconds. Secretions increased afterwards. Have called rapid response again for second evaluation to transfer pt to ICU if possible as she is having respiratory pauses requiring sternal rubbing to awaken her and she is inappropriate for floor. BGs have stabilized with 10% dextrose IV.

## 2021-01-22 DIAGNOSIS — G9341 Metabolic encephalopathy: Secondary | ICD-10-CM | POA: Diagnosis not present

## 2021-01-22 LAB — BASIC METABOLIC PANEL
Anion gap: 6 (ref 5–15)
BUN: 19 mg/dL (ref 8–23)
CO2: 24 mmol/L (ref 22–32)
Calcium: 8.7 mg/dL — ABNORMAL LOW (ref 8.9–10.3)
Chloride: 110 mmol/L (ref 98–111)
Creatinine, Ser: 1.29 mg/dL — ABNORMAL HIGH (ref 0.44–1.00)
GFR, Estimated: 45 mL/min — ABNORMAL LOW (ref >60)
Glucose, Bld: 166 mg/dL — ABNORMAL HIGH (ref 70–99)
Potassium: 3.4 mmol/L — ABNORMAL LOW (ref 3.5–5.1)
Sodium: 140 mmol/L (ref 135–145)

## 2021-01-22 LAB — GLUCOSE, CAPILLARY
Glucose-Capillary: 126 mg/dL — ABNORMAL HIGH (ref 70–99)
Glucose-Capillary: 100 mg/dL — ABNORMAL HIGH (ref 70–99)
Glucose-Capillary: 115 mg/dL — ABNORMAL HIGH (ref 70–99)
Glucose-Capillary: 97 mg/dL (ref 70–99)
Glucose-Capillary: 63 mg/dL — ABNORMAL LOW (ref 70–99)
Glucose-Capillary: 131 mg/dL — ABNORMAL HIGH (ref 70–99)
Glucose-Capillary: 99 mg/dL (ref 70–99)

## 2021-01-22 LAB — CBC
HCT: 32.7 % — ABNORMAL LOW (ref 36.0–46.0)
Hemoglobin: 10.5 g/dL — ABNORMAL LOW (ref 12.0–15.0)
MCH: 30.6 pg (ref 26.0–34.0)
MCHC: 32.1 g/dL (ref 30.0–36.0)
MCV: 95.3 fL (ref 80.0–100.0)
Platelets: 193 10*3/uL (ref 150–400)
RBC: 3.43 MIL/uL — ABNORMAL LOW (ref 3.87–5.11)
RDW: 14.3 % (ref 11.5–15.5)
WBC: 12.5 10*3/uL — ABNORMAL HIGH (ref 4.0–10.5)
nRBC: 0 % (ref 0.0–0.2)

## 2021-01-22 LAB — BLOOD CULTURE ID PANEL (REFLEXED) - BCID2

## 2021-01-22 LAB — SARS CORONAVIRUS 2 (TAT 6-24 HRS): SARS Coronavirus 2: NEGATIVE

## 2021-01-22 LAB — LACTIC ACID, PLASMA: Lactic Acid, Venous: 1.1 mmol/L (ref 0.5–1.9)

## 2021-01-22 LAB — MRSA PCR SCREENING: MRSA by PCR: NEGATIVE

## 2021-01-22 MED ORDER — VANCOMYCIN HCL 10 G IV SOLR
1750.0000 mg | Freq: Once | INTRAVENOUS | Status: AC
  Administered 2021-01-22: 1750 mg via INTRAVENOUS
  Filled 2021-01-22: qty 1750

## 2021-01-22 MED ORDER — HALOPERIDOL LACTATE 5 MG/ML IJ SOLN: 5.0000 mg | Freq: Four times a day (QID) | INTRAMUSCULAR | Status: DC | PRN

## 2021-01-22 MED ORDER — OSMOLITE 1.5 CAL PO LIQD: 1000.0000 mL | ORAL | Status: DC

## 2021-01-22 MED ORDER — DEXTROSE 50 % IV SOLN
INTRAVENOUS | Status: AC
  Filled 2021-01-22: qty 50

## 2021-01-22 MED ORDER — HALOPERIDOL LACTATE 5 MG/ML IJ SOLN
5.0000 mg | Freq: Once | INTRAMUSCULAR | Status: AC
  Administered 2021-01-22: 5 mg via INTRAVENOUS
  Filled 2021-01-22: qty 1

## 2021-01-22 MED ORDER — POTASSIUM CHLORIDE 10 MEQ/100ML IV SOLN
10.0000 meq | INTRAVENOUS | Status: AC
  Administered 2021-01-22 (×4): 10 meq via INTRAVENOUS
  Filled 2021-01-22 (×4): qty 100

## 2021-01-22 MED ORDER — PROSOURCE TF PO LIQD
45.0000 mL | Freq: Every day | ORAL | Status: DC
  Administered 2021-01-23: 45 mL

## 2021-01-22 NOTE — Consult Note (Signed)
NAME:  Michelle Graves, MRN:  [id number], DOB:  Jul 31, 1951, LOS: 1 ADMISSION DATE:  01/21/2021, CONSULTATION DATE:  01/21/2021 REFERRING MD:  Virgina Norfolk, CHIEF COMPLAINT:  AMS  History of Present Illness:  Michelle Graves is a 70 y.o. female w/ PMh of CVA in 2020, HTN, CKD stage 3b and recurrent UTIs, and rheumatoid arthritis who presented to the ED via EMS from her government housing after being found unresponsive on the floor. According to daughter, she last talked with patient around 5PM yesterday evening.  At that time patient had been in her normal state and complained of being tired. At baseline, she is able to care for herself and ambulate in her own home. They checked on patient this morning and they found the patient on the floor and reported that she had vomited. Patient's daughter reports that is not unusual for the patient to get confused when she has a urinary tract infection. Patient was just recently hospitalized from 2/19-2/23 with acute metabolic encephalopathy thought to be multifactorial including hypoglycemia, CVA, and/or polypharmacy. Patient was noted to have left lateral cerebellum infarct her most recent brain MRI. During last hospitalization, urine culture grew staph epididymis but patient was not treated due to being asymptomatic.   In the ED, she was found to be slightly tachypneic and hypertensive but otherwise afebrile with appropriate O2 sats on 2 L nasal cannula.  Urinalysis was positive for large leukocytes, positive nitrites, many bacteria, and greater than 50 WBCs.  CT head/cervical spine with no acute abnormalities.  Mild leukocytosis, normal hemoglobin and mild AKI with a creatinine of 1.79, baseline of 1.4-1.5.  Negative UDS, normal ammonia.  Lactate slightly elevated to 2.8.  Normal chest x-ray.  Patient became agitated and started taking her IV she was given Ativan and Versed.  She was started on IV fluids and Rocephin for UTI.  PCCM will consult for her altered  mental status.   Pertinent  Medical History   Past Medical History:  Diagnosis Date  . Acute on chronic kidney failure (HCC)   . Anemia   . Arthritis   . Chest pain   . Diverticulitis   . Hypertension   . Stroke (HCC)   . UTI (urinary tract infection) 10/2019    Significant Hospital Events: Including procedures, antibiotic start and stop dates in addition to other pertinent events   . 3/28 CT Head >> No evidence of acute intracranial abnormality . 3/28 CT Cervical spine >> Cervical spondylosis but no evidence of acute fracture to the cervical spine . Rocephin 3/28 >> . 3/29 Transferred overnight for possible intubation however on arrival to ICU she was more awake and combative. Precedex was started  Interim History / Subjective:  Transferred overnight for possible intubation however on arrival to ICU she was more awake and combative. Precedex was started  Objective   Blood pressure 136/82, pulse 81, temperature (!) 96.9 F (36.1 C), temperature source Axillary, resp. rate 16, height  (1.702 m), weight 86.7 kg, SpO2 96 %.        Intake/Output Summary (Last 24 hours) at 01/22/2021 0803 Last data filed at 01/22/2021 0532 Gross per 24 hour  Intake 2457.9 ml  Output 150 ml  Net 2307.9 ml   Filed Weights   01/21/21 0924 01/21/21 2200  Weight: 86.5 kg 86.7 kg   Physical Exam: General: Somnolent female in bed, no acute distress HENT: [place], AT, OP clear, MMM Eyes: EOMI, no scleral icterus Respiratory: Clear to auscultation bilaterally.  No crackles, wheezing or rales Cardiovascular: RRR, -M/R/G, no JVD GI: BS+, soft, nontender Extremities:-Edema,-tenderness Neuro: Sedated, pinpoint pupils, equally reactive GU: Foley in place   Labs/imaging that I havepersonally reviewed  (right click and "Reselect all SmartList Selections" daily)  WBC 12.5 Hg 10.5 K 3.4 LA 1.1 Urinalysis shows proteinuria, positive nitrate, large leukocytes, >50 WBC and many bacteria. Checks x-ray  with slight left base atelectasis CT head/cervical spine unremarkable  Resolved Hospital Problem list     Assessment & Plan:  Acute toxic metabolic encephalopathy Hypoglycemia, UTI. Low suspicion for anticholinergic syndrome. Pharmacy reviewed meds. Not on doxipin and unclear if she is on benadryl P: --Wean Precedex --IV antibiotics --D10 drip for hypoglycemia --CBG monitoring --Frequent neuro checks --Avoid sedation/anticholinergic meds  Sepsis secondary to UTI  Patient with a history of UTIs causing altered mental status found to have UTI on admission.  Mild leukocytosis but afebrile. BCID with methicillin resistant Staph Epi which may be contaminant. Will broaden if she develops shock. Lactic acidosis has cleared P: --IV Rocephin for 5-7 days --Repeat blood cultures x2 --F/u Urine cultures --Trend CBC, electrolytes  AKI with CKD 3b - improving Baseline creatinine around 1.4-1.5. BUN/Cr 31/1.79.  Likely prerenal in the setting of dehydration after being found down for unknown duration.  Also consider element of urinary retention with patient's history of urinary retention requiring Foley placement during past hospitalizations. P: --IV fluids --Trend BMP, monitor electrolytes --Strict I&O's --Foley in place  Hypertension Patient found to be hypertensive with sBP in the 170s.  Unknown if patient has taken her antihypertensive meds. --PRN hydralazine IV --Swallow eval before starting home meds  Fall Patient found down for an unknown duration at apartment.  Moderately dehydrated.  CT cervical spine does not show any cervical fractures.  CT head.  No intracranial abnormality.  Anemia of chronic disease --Hemoglobin stable at 11.8  History of CVA MRI 2/19 showed a tiny acute infarction in the left lateral cerebellum likely subclinical.  No residual deficits. On aspirin at home --Continue ASA stable.  Best practice (right click and "Reselect all SmartList Selections" daily)   Diet:  NPO Pain/Anxiety/Delirium protocol (if indicated): Yes VAP protocol (if indicated): Not indicated DVT prophylaxis: Lovenox GI prophylaxis: N/A Glucose control: D10 Central venous access:  N/A Arterial line:  N/A Foley:  N/A Mobility:  OOB  PT consulted: N/A Last date of multidisciplinary goals of care discussion[] . Updated daughter on 3/29 Code Status:  full code Disposition: Progressive  Labs   CBC: Recent Labs  Lab 01/21/21 0948 01/21/21 1213 01/22/21 0440  WBC 12.2*  --  12.5*  NEUTROABS 9.6*  --   --   HGB 11.8* 12.6 10.5*  HCT 36.5 37.0 32.7*  MCV 94.1  --  95.3  PLT 233  --  193    Basic Metabolic Panel: Recent Labs  Lab 01/21/21 0948 01/21/21 1213 01/22/21 0440  NA 142 143 140  K 4.2 3.6 3.4*  CL 105  --  110  CO2 28  --  24  GLUCOSE 117*  --  166*  BUN 31*  --  19  CREATININE 1.79*  --  1.29*  CALCIUM 9.8  --  8.7*   GFR: Estimated Creatinine Clearance: 47.2 mL/min (A) (by C-G formula based on SCr of 1.29 mg/dL (H)). Recent Labs  Lab 01/21/21 0948 01/21/21 1123 01/21/21 2058 01/22/21 0440  WBC 12.2*  --   --  12.5*  LATICACIDVEN  --  2.8* 3.3* 1.1    Liver Function  Tests: Recent Labs  Lab 01/21/21 0948  AST 27  ALT 16  ALKPHOS 88  BILITOT 0.7  PROT 7.5  ALBUMIN 4.0   No results for input(s): LIPASE, AMYLASE in the last 168 hours. Recent Labs  Lab 01/21/21 1204  AMMONIA 27    ABG    Component Value Date/Time   PHART 7.429 01/21/2021 2127   PCO2ART 39.7 01/21/2021 2127   PO2ART 111 (H) 01/21/2021 2127   HCO3 25.7 01/21/2021 2127   TCO2 31 01/21/2021 1213   ACIDBASEDEF 4.8 (H) 03/25/2019 1808   O2SAT 98.0 01/21/2021 2127     Coagulation Profile: Recent Labs  Lab 01/21/21 0948  INR 1.0    Cardiac Enzymes: Recent Labs  Lab 01/21/21 0948  CKTOTAL 281*    HbA1C: Hgb A1c MFr Bld  Date/Time Value Ref Range Status  12/16/2020 02:23 AM 6.3 (H) 4.8 - 5.6 % Final    Comment:    (NOTE) Pre diabetes:           5.7%-6.4%  Diabetes:              >6.4%  Glycemic control for   <7.0% adults with diabetes   04/01/2019 04:51 AM 6.2 (H) 4.8 - 5.6 % Final    Comment:    (NOTE) Pre diabetes:          5.7%-6.4% Diabetes:              >6.4% Glycemic control for   <7.0% adults with diabetes     CBG: Recent Labs  Lab 01/21/21 1631 01/21/21 1952 01/21/21 2250 01/22/21 0336 01/22/21 0748  GLUCAP 63* 116* 111* 126* 100*    Critical care time: 45 min    The patient is critically ill and requires high complexity decision making for assessment and support, frequent evaluation and titration of therapies, application of advanced monitoring technologies and extensive interpretation of multiple databases.   Mechele Collin, M.D. [facility] Pulmonary/Critical Care Medicine 01/22/2021 8:04 AM   Please see Amion for pager number to reach on-call Pulmonary and Critical Care Team.

## 2021-01-22 NOTE — Progress Notes (Signed)
PHARMACY - PHYSICIAN COMMUNICATION CRITICAL VALUE ALERT - BLOOD CULTURE IDENTIFICATION (BCID)  Michelle Graves is an 70 y.o. female who presented to [facility] on 01/21/2021 with a chief complaint of AMS/UTI.  Previously admitted 2/19-2/23/22, urine culture 2/19 grew Staph epi, not treated as pt was asymptommatic at that time  Assessment:   1/2 blood cultures growing MR-Staphylococcus epidermidis  Name of physician (or Provider) Contacted:  Dr. Everardo All  Current antibiotics:  Rocephin  Changes to prescribed antibiotics recommended:  Consider adding Vancomycin   Results for orders placed or performed during the hospital encounter of 01/21/21  Blood Culture ID Panel (Reflexed) (Collected: 01/21/2021  9:48 AM)  Result Value Ref Range   Enterococcus faecalis NOT DETECTED NOT DETECTED   Enterococcus Faecium NOT DETECTED NOT DETECTED   Listeria monocytogenes NOT DETECTED NOT DETECTED   Staphylococcus species DETECTED (A) NOT DETECTED   Staphylococcus aureus (BCID) NOT DETECTED NOT DETECTED   Staphylococcus epidermidis DETECTED (A) NOT DETECTED   Staphylococcus lugdunensis NOT DETECTED NOT DETECTED   Streptococcus species NOT DETECTED NOT DETECTED   Streptococcus agalactiae NOT DETECTED NOT DETECTED   Streptococcus pneumoniae NOT DETECTED NOT DETECTED   Streptococcus pyogenes NOT DETECTED NOT DETECTED   A.calcoaceticus-baumannii NOT DETECTED NOT DETECTED   Bacteroides fragilis NOT DETECTED NOT DETECTED   Enterobacterales NOT DETECTED NOT DETECTED   Enterobacter cloacae complex NOT DETECTED NOT DETECTED   Escherichia coli NOT DETECTED NOT DETECTED   Klebsiella aerogenes NOT DETECTED NOT DETECTED   Klebsiella oxytoca NOT DETECTED NOT DETECTED   Klebsiella pneumoniae NOT DETECTED NOT DETECTED   Proteus species NOT DETECTED NOT DETECTED   Salmonella species NOT DETECTED NOT DETECTED   Serratia marcescens NOT DETECTED NOT DETECTED   Haemophilus influenzae NOT DETECTED NOT DETECTED    Neisseria meningitidis NOT DETECTED NOT DETECTED   Pseudomonas aeruginosa NOT DETECTED NOT DETECTED   Stenotrophomonas maltophilia NOT DETECTED NOT DETECTED   Candida albicans NOT DETECTED NOT DETECTED   Candida auris NOT DETECTED NOT DETECTED   Candida glabrata NOT DETECTED NOT DETECTED   Candida krusei NOT DETECTED NOT DETECTED   Candida parapsilosis NOT DETECTED NOT DETECTED   Candida tropicalis NOT DETECTED NOT DETECTED   Cryptococcus neoformans/gattii NOT DETECTED NOT DETECTED   Methicillin resistance mecA/C DETECTED (A) NOT DETECTED    [name] 01/22/2021  6:48 AM

## 2021-01-22 NOTE — Progress Notes (Signed)
Initial Nutrition Assessment  DOCUMENTATION CODES:   Not applicable  INTERVENTION:   Once NG tube/Cortrak placed, initiate tube feeing: - Osmolite 1.5 @ 50 ml/hr (1200 ml/day) - ProSource TF 45 ml daily  Tube feeding regimen provides 1840 kcal, 86 grams of protein, and 914 ml of H2O.  NUTRITION DIAGNOSIS:   Inadequate oral intake related to lethargy/confusion as evidenced by NPO status.  GOAL:   Patient will meet greater than or equal to 90% of their needs  MONITOR:   Diet advancement,Labs,Weight trends,TF tolerance  REASON FOR ASSESSMENT:   Consult Enteral/tube feeding initiation and management  ASSESSMENT:   70 year old female who presented to the ED on 3/28 with AMS. Pt was found unresponsive on the floor. PMH of HTN, CVA in 2020, anemia, rheumatoid arthritis, CKD stage IIIb. Admitted with sepsis secondary to UTI.   Discussed pt with RN and during ICU rounds. Plan is for NG tube placement today for PO medication administration. Discussed with MD and okay to order tube feeds as well.  Discussed pt with RN again later. NG tube placement unsuccessful due to pt's agitation. Plan is for Cortrak placement and bridle tomorrow.  Reviewed weight history in chart. Weight trending up over the last month. Unable to obtain diet and weight history from pt due to agitation and confusion.  Medications reviewed and include: IV abx, precedex, D10 @ 75 ml/hr, IV KCl 10 mEq x 4 runs  Labs reviewed: potassium 3.4, creatinine 1.29 CBG's: 55-126 x 24 hours  I/O's: +3.3 L since admit  NUTRITION - FOCUSED PHYSICAL EXAM:  Flowsheet Row Most Recent Value  Orbital Region No depletion  Upper Arm Region No depletion  Thoracic and Lumbar Region No depletion  Buccal Region No depletion  Temple Region Mild depletion  Clavicle Bone Region Mild depletion  Clavicle and Acromion Bone Region Mild depletion  Scapular Bone Region Unable to assess  Dorsal Hand No depletion  Patellar Region Mild  depletion  Anterior Thigh Region Moderate depletion  Posterior Calf Region Mild depletion  Edema (RD Assessment) Mild  Hair Reviewed  Eyes Reviewed  Mouth Reviewed  Skin Reviewed  Nails Reviewed       Diet Order:   Diet Order    None      EDUCATION NEEDS:   Not appropriate for education at this time  Skin:  Skin Assessment: Reviewed RN Assessment  Last BM:  no documented BM  Height:   Ht Readings from Last 1 Encounters:  01/21/21 5\' 7"  (1.702 m)    Weight:   Wt Readings from Last 1 Encounters:  01/21/21 86.7 kg    BMI:  Body mass index is 29.94 kg/m.  Estimated Nutritional Needs:   Kcal:  1800-2000  Protein:  85-100 grams  Fluid:  1.8-2.0 L    01/23/21, MS, RD, LDN Inpatient Clinical Dietitian Please see AMiON for contact information.

## 2021-01-23 ENCOUNTER — Inpatient Hospital Stay (HOSPITAL_COMMUNITY): Payer: Medicare Other

## 2021-01-23 DIAGNOSIS — G9341 Metabolic encephalopathy: Secondary | ICD-10-CM | POA: Diagnosis not present

## 2021-01-23 LAB — COMPREHENSIVE METABOLIC PANEL
ALT: 19 U/L (ref 0–44)
AST: 27 U/L (ref 15–41)
Albumin: 3.4 g/dL — ABNORMAL LOW (ref 3.5–5.0)
Alkaline Phosphatase: 66 U/L (ref 38–126)
Anion gap: 11 (ref 5–15)
BUN: 10 mg/dL (ref 8–23)
CO2: 21 mmol/L — ABNORMAL LOW (ref 22–32)
Calcium: 9.2 mg/dL (ref 8.9–10.3)
Chloride: 106 mmol/L (ref 98–111)
Creatinine, Ser: 1.16 mg/dL — ABNORMAL HIGH (ref 0.44–1.00)
GFR, Estimated: 51 mL/min — ABNORMAL LOW (ref >60)
Glucose, Bld: 166 mg/dL — ABNORMAL HIGH (ref 70–99)
Potassium: 3.6 mmol/L (ref 3.5–5.1)
Sodium: 138 mmol/L (ref 135–145)
Total Bilirubin: 0.5 mg/dL (ref 0.3–1.2)
Total Protein: 6.5 g/dL (ref 6.5–8.1)

## 2021-01-23 LAB — GLUCOSE, CAPILLARY
Glucose-Capillary: 120 mg/dL — ABNORMAL HIGH (ref 70–99)
Glucose-Capillary: 132 mg/dL — ABNORMAL HIGH (ref 70–99)
Glucose-Capillary: 138 mg/dL — ABNORMAL HIGH (ref 70–99)
Glucose-Capillary: 150 mg/dL — ABNORMAL HIGH (ref 70–99)
Glucose-Capillary: 154 mg/dL — ABNORMAL HIGH (ref 70–99)
Glucose-Capillary: 74 mg/dL (ref 70–99)

## 2021-01-23 LAB — CBC
HCT: 34.3 % — ABNORMAL LOW (ref 36.0–46.0)
Hemoglobin: 11.5 g/dL — ABNORMAL LOW (ref 12.0–15.0)
MCH: 31.5 pg (ref 26.0–34.0)
MCHC: 33.5 g/dL (ref 30.0–36.0)
MCV: 94 fL (ref 80.0–100.0)
Platelets: 219 10*3/uL (ref 150–400)
RBC: 3.65 MIL/uL — ABNORMAL LOW (ref 3.87–5.11)
RDW: 14.3 % (ref 11.5–15.5)
WBC: 12.6 10*3/uL — ABNORMAL HIGH (ref 4.0–10.5)
nRBC: 0 % (ref 0.0–0.2)

## 2021-01-23 LAB — URINE CULTURE: Culture: >=100000 — AB

## 2021-01-23 LAB — LIPASE, BLOOD: Lipase: 24 U/L (ref 11–51)

## 2021-01-23 LAB — TROPONIN I (HIGH SENSITIVITY): Troponin I (High Sensitivity): 20 ng/L — ABNORMAL HIGH (ref <18)

## 2021-01-23 MED ORDER — CARVEDILOL 12.5 MG PO TABS
25.0000 mg | ORAL_TABLET | Freq: Two times a day (BID) | ORAL | Status: DC
  Administered 2021-01-23 – 2021-01-30 (×14): 25 mg via ORAL
  Filled 2021-01-23: qty 1
  Filled 2021-01-23 (×7): qty 2
  Filled 2021-01-23: qty 4
  Filled 2021-01-23: qty 2
  Filled 2021-01-23: qty 1
  Filled 2021-01-23 (×3): qty 2

## 2021-01-23 MED ORDER — DILTIAZEM HCL ER COATED BEADS 180 MG PO CP24
180.0000 mg | ORAL_CAPSULE | Freq: Two times a day (BID) | ORAL | Status: DC
  Administered 2021-01-23 – 2021-01-30 (×13): 180 mg via ORAL
  Filled 2021-01-23 (×16): qty 1

## 2021-01-23 MED ORDER — CLONIDINE HCL 0.1 MG PO TABS
0.3000 mg | ORAL_TABLET | Freq: Three times a day (TID) | ORAL | Status: DC
  Administered 2021-01-23 – 2021-01-24 (×3): 0.3 mg via ORAL
  Filled 2021-01-23: qty 3
  Filled 2021-01-23: qty 1
  Filled 2021-01-23: qty 3
  Filled 2021-01-23 (×2): qty 1

## 2021-01-23 MED ORDER — LABETALOL HCL 5 MG/ML IV SOLN
5.0000 mg | INTRAVENOUS | Status: DC | PRN
  Administered 2021-01-23 (×3): 5 mg via INTRAVENOUS
  Filled 2021-01-23 (×3): qty 4

## 2021-01-23 MED ORDER — LABETALOL HCL 5 MG/ML IV SOLN
5.0000 mg | Freq: Once | INTRAVENOUS | Status: AC
  Administered 2021-01-23: 5 mg via INTRAVENOUS
  Filled 2021-01-23: qty 4

## 2021-01-23 MED ORDER — LABETALOL HCL 5 MG/ML IV SOLN
10.0000 mg | INTRAVENOUS | Status: DC | PRN
  Administered 2021-01-23 – 2021-01-24 (×2): 20 mg via INTRAVENOUS
  Filled 2021-01-23 (×3): qty 4

## 2021-01-23 MED ORDER — POTASSIUM CHLORIDE 20 MEQ PO PACK: 40.0000 meq | PACK | Freq: Once | ORAL | Status: DC

## 2021-01-23 MED ORDER — PROCHLORPERAZINE EDISYLATE 10 MG/2ML IJ SOLN
10.0000 mg | Freq: Once | INTRAMUSCULAR | Status: AC
  Administered 2021-01-23: 10 mg via INTRAVENOUS
  Filled 2021-01-23: qty 2

## 2021-01-23 MED ORDER — VANCOMYCIN HCL 1250 MG/250ML IV SOLN
1250.0000 mg | Freq: Once | INTRAVENOUS | Status: AC
  Administered 2021-01-23: 1250 mg via INTRAVENOUS
  Filled 2021-01-23: qty 250

## 2021-01-23 NOTE — Consult Note (Addendum)
NAME:  Michelle Graves, MRN:  [id number], DOB:  [birth year], LOS: 2 ADMISSION DATE:  01/21/2021, CONSULTATION DATE:  01/21/2021 REFERRING MD:  Virgina Norfolk, CHIEF COMPLAINT:  AMS  History of Present Illness:  Michelle Graves is a 70 y.o. female w/ PMh of CVA in 2020, HTN, CKD stage 3b and recurrent UTIs, and rheumatoid arthritis who presented to the ED via EMS from her government housing after being found unresponsive on the floor. According to daughter, she last talked with patient around 5PM yesterday evening.  At that time patient had been in her normal state and complained of being tired. At baseline, she is able to care for herself and ambulate in her own home. They checked on patient this morning and they found the patient on the floor and reported that she had vomited. Patient's daughter reports that is not unusual for the patient to get confused when she has a urinary tract infection. Patient was just recently hospitalized from 2/19-2/23 with acute metabolic encephalopathy thought to be multifactorial including hypoglycemia, CVA, and/or polypharmacy. Patient was noted to have left lateral cerebellum infarct her most recent brain MRI. During last hospitalization, urine culture grew staph epididymis but patient was not treated due to being asymptomatic.   In the ED, she was found to be slightly tachypneic and hypertensive but otherwise afebrile with appropriate O2 sats on 2 L nasal cannula.  Urinalysis was positive for large leukocytes, positive nitrites, many bacteria, and greater than 50 WBCs.  CT head/cervical spine with no acute abnormalities.  Mild leukocytosis, normal hemoglobin and mild AKI with a creatinine of 1.79, baseline of 1.4-1.5.  Negative UDS, normal ammonia.  Lactate slightly elevated to 2.8.  Normal chest x-ray.  Patient became agitated and started taking her IV she was given Ativan and Versed.  She was started on IV fluids and Rocephin for UTI.  PCCM will consult for her altered  mental status.   Pertinent  Medical History   Past Medical History:  Diagnosis Date  . Acute on chronic kidney failure (HCC)   . Anemia   . Arthritis   . Chest pain   . Diverticulitis   . Hypertension   . Stroke (HCC)   . UTI (urinary tract infection) 10/2019    Significant Hospital Events: Including procedures, antibiotic start and stop dates in addition to other pertinent events   . 3/28 CT Head >> No evidence of acute intracranial abnormality . 3/28 CT Cervical spine >> Cervical spondylosis but no evidence of acute fracture to the cervical spine . Rocephin 3/28 >> . 3/29 Transferred overnight for possible intubation however on arrival to ICU she was more awake and combative. Precedex was started . 3/30 Precedex weaned off  Interim History / Subjective:  Weaned off Precedex this morning. Nausea  IV hydralazine for SBP >180  Objective   Blood pressure (!) 139/95, pulse 93, temperature 99.5 F (37.5 C), temperature source Oral, resp. rate 17, height 5\' 7"  (1.702 m), weight 86.7 kg, SpO2 98 %.        Intake/Output Summary (Last 24 hours) at 01/23/2021 0927 Last data filed at 01/23/2021 0500 Gross per 24 hour  Intake 3104.76 ml  Output 1375 ml  Net 1729.76 ml   Filed Weights   01/21/21 0924 01/21/21 2200  Weight: 86.5 kg 86.7 kg   Physical Exam: General: Chronically ill-appearing, no acute distress HENT: [place], AT, OP clear, MMM Eyes: EOMI, no scleral icterus Respiratory: Clear to auscultation bilaterally.  No crackles, wheezing or  rales Cardiovascular: RRR, -M/R/G, no JVD GI: BS+, soft, epigastric tenderness Extremities:-Edema,-tenderness Neuro: AAO x4, CNII-XII grossly intact Skin: Right upper cheek skin tear GU: Foley in place  Labs/imaging that I havepersonally reviewed  (right click and "Reselect all SmartList Selections" daily)  WBC 12.5 Hg 10.5 K 3.4 LA 1.1 Urinalysis shows proteinuria, positive nitrate, large leukocytes, >50 WBC and many  bacteria. Checks x-ray with slight left base atelectasis CT head/cervical spine unremarkable  Resolved Hospital Problem list     Assessment & Plan:  Acute toxic metabolic encephalopathy - improved Hypoglycemia, UTI. Low suspicion for anticholinergic syndrome. Pharmacy reviewed meds. Not on doxipin and unclear if she is on benadryl P: --Off Precedex --IV antibiotics --D10 drip @ 75cc/hr for hypoglycemia --CBG monitoring --Frequent neuro checks --Avoid sedation/anticholinergic meds --Speech/swallow evaluation  Sepsis secondary to UTI  Patient with a history of UTIs causing altered mental status found to have UTI on admission.  Mild leukocytosis but afebrile. BCID with methicillin resistant Staph Epi which may be contaminant. Will broaden if she develops shock. Lactic acidosis has cleared P: --S/p Vanc x 2 doses --IV Rocephin for 5-7 days --Follow-up blood cultures x2 --Trend CBC, electrolytes  AKI with CKD 3b - good UOP Baseline creatinine around 1.4-1.5. BUN/Cr 31/1.79.  Likely prerenal in the setting of dehydration after being found down for unknown duration.  Also consider element of urinary retention with patient's history of urinary retention requiring Foley placement during past hospitalizations. P: --IV fluids --Trend BMP, monitor electrolytes --Strict I&O's --Foley in place  Nausea/abdominal pain/chest pain --Lab work-up including lipase and troponin  Hypertension Patient found to be hypertensive with sBP in the 170s.  Unknown if patient has taken her antihypertensive meds. --PRN hydralazine IV --PRN labetalol  --Start PO meds when enteral access is gained  Fall Patient found down for an unknown duration at apartment.  Moderately dehydrated.  CT cervical spine does not show any cervical fractures.  CT head.  No intracranial abnormality.  Anemia of chronic disease --Hemoglobin stable at 11.8  History of CVA MRI 2/19 showed a tiny acute infarction in the left  lateral cerebellum likely subclinical.  No residual deficits. On aspirin at home --Continue ASA stable.  Best practice (right click and "Reselect all SmartList Selections" daily)  Diet:  NPO, swallow evaluation Pain/Anxiety/Delirium protocol (if indicated): Yes VAP protocol (if indicated): Not indicated DVT prophylaxis: Lovenox GI prophylaxis: N/A Glucose control: D10 Central venous access:  N/A Arterial line:  N/A Foley:  N/A Mobility:  OOB  PT consulted: N/A Last date of multidisciplinary goals of care discussion[] . Updated daughter on 3/29 Code Status:  full code Disposition: Progressive  Labs   CBC: Recent Labs  Lab 01/21/21 0948 01/21/21 1213 01/22/21 0440  WBC 12.2*  --  12.5*  NEUTROABS 9.6*  --   --   HGB 11.8* 12.6 10.5*  HCT 36.5 37.0 32.7*  MCV 94.1  --  95.3  PLT 233  --  193    Basic Metabolic Panel: Recent Labs  Lab 01/21/21 0948 01/21/21 1213 01/22/21 0440  NA 142 143 140  K 4.2 3.6 3.4*  CL 105  --  110  CO2 28  --  24  GLUCOSE 117*  --  166*  BUN 31*  --  19  CREATININE 1.79*  --  1.29*  CALCIUM 9.8  --  8.7*   GFR: Estimated Creatinine Clearance: 47.2 mL/min (A) (by C-G formula based on SCr of 1.29 mg/dL (H)). Recent Labs  Lab 01/21/21 [phone] 01/21/21  1123 01/21/21 2058 01/22/21 0440  WBC 12.2*  --   --  12.5*  LATICACIDVEN  --  2.8* 3.3* 1.1    Liver Function Tests: Recent Labs  Lab 01/21/21 0948  AST 27  ALT 16  ALKPHOS 88  BILITOT 0.7  PROT 7.5  ALBUMIN 4.0   No results for input(s): LIPASE, AMYLASE in the last 168 hours. Recent Labs  Lab 01/21/21 1204  AMMONIA 27    ABG    Component Value Date/Time   PHART 7.429 01/21/2021 2127   PCO2ART 39.7 01/21/2021 2127   PO2ART 111 (H) 01/21/2021 2127   HCO3 25.7 01/21/2021 2127   TCO2 31 01/21/2021 1213   ACIDBASEDEF 4.8 (H) 03/25/2019 1808   O2SAT 98.0 01/21/2021 2127     Coagulation Profile: Recent Labs  Lab 01/21/21 0948  INR 1.0    Cardiac Enzymes: Recent  Labs  Lab 01/21/21 0948  CKTOTAL 281*    HbA1C: Hgb A1c MFr Bld  Date/Time Value Ref Range Status  12/16/2020 02:23 AM 6.3 (H) 4.8 - 5.6 % Final    Comment:    (NOTE) Pre diabetes:          5.7%-6.4%  Diabetes:              >6.4%  Glycemic control for   <7.0% adults with diabetes   04/01/2019 04:51 AM 6.2 (H) 4.8 - 5.6 % Final    Comment:    (NOTE) Pre diabetes:          5.7%-6.4% Diabetes:              >6.4% Glycemic control for   <7.0% adults with diabetes     CBG: Recent Labs  Lab 01/22/21 1923 01/22/21 1952 01/22/21 2313 01/23/21 0334 01/23/21 0737  GLUCAP 63* 131* 99 74 132*    Critical care time: N/A    Care Time: 40 Minutes.   Mechele Graves, M.D. [facility] Pulmonary/Critical Care Medicine 01/23/2021 9:28 AM   Please see Amion for pager number to reach on-call Pulmonary and Critical Care Team.

## 2021-01-23 NOTE — Progress Notes (Signed)
eLink Physician-Brief Progress Note Patient Name: Michelle Graves DOB: 09-16-1951 MRN: [id number]   Date of Service  01/23/2021  HPI/Events of Note  C/o abd pain Nausea & retching, received compazine this am, qt 511 XR abd  - no bowel obs  CT abd 11/2020 - chronic narrowing of colon at hepatic flexure  eICU Interventions  zofran x 1 ,limited by long qT Avoid narcotics If pain worse, can try toradol     Intervention Category Intermediate Interventions: Pain - evaluation and management;Other:  Michelle Graves Michelle Graves 01/23/2021, 8:19 PM

## 2021-01-23 NOTE — Evaluation (Signed)
Clinical/Bedside Swallow Evaluation Patient Details  Name: Michelle Graves MRN: [id number] Date of Birth: May 16, 1951  Today's Date: 01/23/2021 Time: SLP Start Time (ACUTE ONLY): 1220 SLP Stop Time (ACUTE ONLY): 1228 SLP Time Calculation (min) (ACUTE ONLY): 8 min  Past Medical History:  Past Medical History:  Diagnosis Date  . Acute on chronic kidney failure (HCC)   . Anemia   . Arthritis   . Chest pain   . Diverticulitis   . Hypertension   . Stroke (HCC)   . UTI (urinary tract infection) 10/2019   Past Surgical History:  Past Surgical History:  Procedure Laterality Date  . BIOPSY  04/01/2019   Procedure: BIOPSY;  Surgeon: Kathi Der, MD;  Location: [facility] ENDOSCOPY;  Service: Gastroenterology;;  . BIOPSY  10/05/2019   Procedure: BIOPSY;  Surgeon: Kathi Der, MD;  Location: [facility] ENDOSCOPY;  Service: Gastroenterology;;  . BIOPSY  05/10/2020   Procedure: BIOPSY;  Surgeon: Kathi Der, MD;  Location: [facility] ENDOSCOPY;  Service: Gastroenterology;;  . BIOPSY  11/29/2020   Procedure: BIOPSY;  Surgeon: Charlott Rakes, MD;  Location: [facility] ENDOSCOPY;  Service: Gastroenterology;;  . COLONOSCOPY N/A 11/29/2020   Procedure: COLONOSCOPY;  Surgeon: Charlott Rakes, MD;  Location: [facility] ENDOSCOPY;  Service: Gastroenterology;  Laterality: N/A;  . COLONOSCOPY WITH PROPOFOL N/A 04/01/2019   Procedure: COLONOSCOPY WITH PROPOFOL;  Surgeon: Kathi Der, MD;  Location: [facility] ENDOSCOPY;  Service: Gastroenterology;  Laterality: N/A;  . COLONOSCOPY WITH PROPOFOL N/A 10/05/2019   Procedure: COLONOSCOPY WITH PROPOFOL;  Surgeon: Kathi Der, MD;  Location: [facility] ENDOSCOPY;  Service: Gastroenterology;  Laterality: N/A;  . COLONOSCOPY WITH PROPOFOL N/A 05/10/2020   Procedure: COLONOSCOPY WITH PROPOFOL;  Surgeon: Kathi Der, MD;  Location: [facility] ENDOSCOPY;  Service: Gastroenterology;  Laterality: N/A;  . ESOPHAGOGASTRODUODENOSCOPY (EGD) WITH PROPOFOL N/A 04/01/2019   Procedure:  ESOPHAGOGASTRODUODENOSCOPY (EGD) WITH PROPOFOL;  Surgeon: Kathi Der, MD;  Location: [facility] ENDOSCOPY;  Service: Gastroenterology;  Laterality: N/A;  . ESOPHAGOGASTRODUODENOSCOPY (EGD) WITH PROPOFOL N/A 10/05/2019   Procedure: ESOPHAGOGASTRODUODENOSCOPY (EGD) WITH PROPOFOL;  Surgeon: Kathi Der, MD;  Location: [facility] ENDOSCOPY;  Service: Gastroenterology;  Laterality: N/A;  . POLYPECTOMY  11/29/2020   Procedure: POLYPECTOMY;  Surgeon: Charlott Rakes, MD;  Location: [facility] ENDOSCOPY;  Service: Gastroenterology;;  . Michelle Graves TATTOO INJECTION  11/29/2020   Procedure: SUBMUCOSAL TATTOO INJECTION;  Surgeon: Charlott Rakes, MD;  Location: [facility] ENDOSCOPY;  Service: Gastroenterology;;  . TONSILLECTOMY     HPI:  Michelle Graves is a 70 y.o. female w/ PMh of CVA in 2020, HTN, CKD stage 3b and recurrent UTIs, and rheumatoid arthritis who presented to the ED via EMS from her government housing after being found unresponsive on the floor. Pt found to have UTI with sepsis and AMS.   Patient was just recently hospitalized from 2/19-2/23 with acute metabolic encephalopathy thought to be multifactorial including hypoglycemia, CVA, and/or polypharmacy. Patient was noted to have left lateral cerebellum infarct her most recent brain MRI.   Assessment / Plan / Recommendation Clinical Impression  No evidence of oropharyngeal dysphagia found with brief trials of ice, water and puree. However, most of these trials resulted in vomiting as pt was severely nauseous. In a prior admission, SLP notes reported pt had severe nausea with UTI, but not dysphagia. Would recommend allowing POs of choice when pt able to tolerate. No need for SLP f/u in diet initiation . SLP Visit Diagnosis: Dysphagia, unspecified (R13.10)    Aspiration Risk  Risk for inadequate nutrition/hydration    Diet Recommendation Regular;Thin liquid  Liquid Administration via: Cup;Straw Medication Administration: Whole meds with liquid    Other   Recommendations     Follow up Recommendations None      Frequency and Duration            Prognosis Prognosis for Safe Diet Advancement: Good      Swallow Study   General HPI: Michelle Graves is a 70 y.o. female w/ PMh of CVA in 2020, HTN, CKD stage 3b and recurrent UTIs, and rheumatoid arthritis who presented to the ED via EMS from her government housing after being found unresponsive on the floor. Pt found to have UTI with sepsis and AMS.   Patient was just recently hospitalized from 2/19-2/23 with acute metabolic encephalopathy thought to be multifactorial including hypoglycemia, CVA, and/or polypharmacy. Patient was noted to have left lateral cerebellum infarct her most recent brain MRI. Type of Study: Bedside Swallow Evaluation Diet Prior to this Study: NPO Temperature Spikes Noted: No Respiratory Status: Room air History of Recent Intubation: No Behavior/Cognition: Alert Oral Cavity Assessment: Within Functional Limits Oral Care Completed by SLP: No Oral Cavity - Dentition: Adequate natural dentition Vision: Functional for self-feeding Self-Feeding Abilities: Able to feed self Patient Positioning: Upright in bed Baseline Vocal Quality: Normal Volitional Cough: Strong Volitional Swallow: Able to elicit    Oral/Motor/Sensory Function Overall Oral Motor/Sensory Function: Within functional limits   Ice Chips Ice chips: Within functional limits   Thin Liquid Thin Liquid: Within functional limits    Nectar Thick Nectar Thick Liquid: Not tested   Honey Thick Honey Thick Liquid: Not tested   Puree Puree: Within functional limits   Solid           Michelle Ditty, MA CCC-SLP  Acute Rehabilitation Services Pager [phone] Office [phone]  Michelle Graves 01/23/2021,2:05 PM

## 2021-01-24 DIAGNOSIS — G9341 Metabolic encephalopathy: Secondary | ICD-10-CM | POA: Diagnosis not present

## 2021-01-24 LAB — CBC
HCT: 34.1 % — ABNORMAL LOW (ref 36.0–46.0)
Hemoglobin: 11.5 g/dL — ABNORMAL LOW (ref 12.0–15.0)
MCH: 31.4 pg (ref 26.0–34.0)
MCHC: 33.7 g/dL (ref 30.0–36.0)
MCV: 93.2 fL (ref 80.0–100.0)
Platelets: 188 10*3/uL (ref 150–400)
RBC: 3.66 MIL/uL — ABNORMAL LOW (ref 3.87–5.11)
RDW: 14.4 % (ref 11.5–15.5)
WBC: 13.5 10*3/uL — ABNORMAL HIGH (ref 4.0–10.5)
nRBC: 0 % (ref 0.0–0.2)

## 2021-01-24 LAB — BASIC METABOLIC PANEL
Anion gap: 10 (ref 5–15)
BUN: 11 mg/dL (ref 8–23)
CO2: 20 mmol/L — ABNORMAL LOW (ref 22–32)
Calcium: 9.1 mg/dL (ref 8.9–10.3)
Chloride: 105 mmol/L (ref 98–111)
Creatinine, Ser: 1.12 mg/dL — ABNORMAL HIGH (ref 0.44–1.00)
GFR, Estimated: 54 mL/min — ABNORMAL LOW (ref >60)
Glucose, Bld: 158 mg/dL — ABNORMAL HIGH (ref 70–99)
Potassium: 3.4 mmol/L — ABNORMAL LOW (ref 3.5–5.1)
Sodium: 135 mmol/L (ref 135–145)

## 2021-01-24 LAB — GLUCOSE, CAPILLARY
Glucose-Capillary: 171 mg/dL — ABNORMAL HIGH (ref 70–99)
Glucose-Capillary: 137 mg/dL — ABNORMAL HIGH (ref 70–99)
Glucose-Capillary: 158 mg/dL — ABNORMAL HIGH (ref 70–99)
Glucose-Capillary: 140 mg/dL — ABNORMAL HIGH (ref 70–99)

## 2021-01-24 LAB — MAGNESIUM: Magnesium: 1.5 mg/dL — ABNORMAL LOW (ref 1.7–2.4)

## 2021-01-24 LAB — CULTURE, BLOOD (ROUTINE X 2): Special Requests: ADEQUATE

## 2021-01-24 MED ORDER — SENNOSIDES-DOCUSATE SODIUM 8.6-50 MG PO TABS: 1.0000 | ORAL_TABLET | Freq: Two times a day (BID) | ORAL | Status: DC

## 2021-01-24 MED ORDER — POLYETHYLENE GLYCOL 3350 17 G PO PACK
17.0000 g | PACK | Freq: Every day | ORAL | Status: DC
  Administered 2021-01-24 – 2021-01-26 (×3): 17 g via ORAL
  Filled 2021-01-24 (×3): qty 1

## 2021-01-24 MED ORDER — MAGNESIUM SULFATE 50 % IJ SOLN
3.0000 g | Freq: Once | INTRAVENOUS | Status: AC
  Administered 2021-01-24: 3 g via INTRAVENOUS
  Filled 2021-01-24: qty 6

## 2021-01-24 MED ORDER — BOOST / RESOURCE BREEZE PO LIQD CUSTOM
1.0000 | Freq: Three times a day (TID) | ORAL | Status: DC
  Administered 2021-01-24 – 2021-01-29 (×16): 1 via ORAL
  Filled 2021-01-24 (×2): qty 1

## 2021-01-24 MED ORDER — HYDROXYZINE HCL 50 MG/ML IM SOLN
25.0000 mg | Freq: Four times a day (QID) | INTRAMUSCULAR | Status: DC | PRN
  Administered 2021-01-24: 25 mg via INTRAMUSCULAR
  Filled 2021-01-24 (×2): qty 0.5

## 2021-01-24 MED ORDER — FLEET ENEMA 7-19 GM/118ML RE ENEM
1.0000 | ENEMA | Freq: Once | RECTAL | Status: AC
  Administered 2021-01-24: 1 via RECTAL
  Filled 2021-01-24: qty 1

## 2021-01-24 MED ORDER — WHITE PETROLATUM EX OINT
TOPICAL_OINTMENT | CUTANEOUS | Status: AC
  Filled 2021-01-24: qty 28.35

## 2021-01-24 NOTE — Progress Notes (Signed)
Report was given by Aida Puffer.  Pt transferring to 3w12 at this time.  No s/s of any acute distress noted.

## 2021-01-24 NOTE — Progress Notes (Addendum)
NAME:  Michelle Graves, MRN:  [id number], DOB:  07/03/1951, LOS: 3 ADMISSION DATE:  01/21/2021, CONSULTATION DATE:  01/21/2021 REFERRING MD:  Virgina Norfolk, CHIEF COMPLAINT:  AMS  History of Present Illness:  Michelle Graves is a 70 y.o. female w/ PMh of CVA in 2020, HTN, CKD stage 3b and recurrent UTIs, and rheumatoid arthritis who presented to the ED via EMS from her government housing after being found unresponsive on the floor. According to daughter, she last talked with patient around 5PM yesterday evening.  At that time patient had been in her normal state and complained of being tired. At baseline, she is able to care for herself and ambulate in her own home. They checked on patient this morning and they found the patient on the floor and reported that she had vomited. Patient's daughter reports that is not unusual for the patient to get confused when she has a urinary tract infection. Patient was just recently hospitalized from 2/19-2/23 with acute metabolic encephalopathy thought to be multifactorial including hypoglycemia, CVA, and/or polypharmacy. Patient was noted to have left lateral cerebellum infarct her most recent brain MRI. During last hospitalization, urine culture grew staph epididymis but patient was not treated due to being asymptomatic.   In the ED, she was found to be slightly tachypneic and hypertensive but otherwise afebrile with appropriate O2 sats on 2 L nasal cannula.  Urinalysis was positive for large leukocytes, positive nitrites, many bacteria, and greater than 50 WBCs.  CT head/cervical spine with no acute abnormalities.  Mild leukocytosis, normal hemoglobin and mild AKI with a creatinine of 1.79, baseline of 1.4-1.5.  Negative UDS, normal ammonia.  Lactate slightly elevated to 2.8.  Normal chest x-ray.  Patient became agitated and started taking her IV she was given Ativan and Versed.  She was started on IV fluids and Rocephin for UTI.  PCCM will consult for her altered  mental status.   Pertinent  Medical History   Past Medical History:  Diagnosis Date  . Acute on chronic kidney failure (HCC)   . Anemia   . Arthritis   . Chest pain   . Diverticulitis   . Hypertension   . Stroke (HCC)   . UTI (urinary tract infection) 10/2019    Significant Hospital Events: Including procedures, antibiotic start and stop dates in addition to other pertinent events   . 3/28 CT Head >> No evidence of acute intracranial abnormality . 3/28 CT Cervical spine >> Cervical spondylosis but no evidence of acute fracture to the cervical spine . Rocephin 3/28 >> . 3/29 Transferred overnight for possible intubation however on arrival to ICU she was more awake and combative. Precedex was started . 3/30 Precedex weaned off . 3/31 Persistent nausea/emesis. KUB with moderate stool  Interim History / Subjective:  Passed swallow evaluation. Continues to have nausea and emesis. KUB with moderate stool   Objective   Blood pressure 123/78, pulse 94, temperature 98.9 F (37.2 C), temperature source Oral, resp. rate 20, height 5\' 7"  (1.702 m), weight 86.7 kg, SpO2 96 %.        Intake/Output Summary (Last 24 hours) at 01/24/2021 0911 Last data filed at 01/24/2021 0900 Gross per 24 hour  Intake 1642.66 ml  Output 485 ml  Net 1157.66 ml   Filed Weights   01/21/21 0924 01/21/21 2200  Weight: 86.5 kg 86.7 kg   Physical Exam: General: Chronically ill-appearing, no acute distress HENT: [place], AT, OP clear, MMM Eyes: EOMI, no scleral icterus Respiratory:  Clear to auscultation bilaterally.  No crackles, wheezing or rales Cardiovascular: RRR, -M/R/G, no JVD GI: BS+, soft, nontender Extremities:-Edema,-tenderness Neuro: Awake and alert, follows commands x 4 Skin: Right upper cheek skin tear  Labs/imaging that I havepersonally reviewed  (right click and "Reselect all SmartList Selections" daily)  WBC 13.5 Hg 10.5 K 3.4 Cr 1.12 LA 1.1 Trop 20 Urinalysis shows proteinuria,  positive nitrate, large leukocytes, >50 WBC and many bacteria. Checks x-ray with slight left base atelectasis CT head/cervical spine unremarkable  Resolved Hospital Problem list     Assessment & Plan:  Acute toxic metabolic encephalopathy - improved Hypoglycemia, UTI. Low suspicion for anticholinergic syndrome. Pharmacy reviewed meds. Not on doxipin and unclear if she is on benadryl P: --IV antibiotics --D10 drip @ 25cc/hr for hypoglycemia --CBG monitoring --Avoid sedation/anticholinergic meds  Sepsis secondary to UTI  Staph epi and Staph hominis contaminant in blood culture P: --IV Rocephin for 5 days --Follow-up blood cultures x2 --Trend CBC, electrolytes  AKI with CKD 3b - good UOP Baseline creatinine around 1.4-1.5. BUN/Cr 31/1.79.  Likely prerenal in the setting of dehydration after being found down for unknown duration.  Also consider element of urinary retention with patient's history of urinary retention requiring Foley placement during past hospitalizations. P: --IV fluids --Trend BMP, monitor electrolytes --Strict I&O's --Foley in place  Constipation Nausea/abdominal pain/chest pain --Lab work-up unremarkable --Enema --Start hydroxyzine for nausea  Hypertension Patient found to be hypertensive with sBP in the 170s.  Unknown if patient has taken her antihypertensive meds. --On home coreg, clonidine, diltiazem --PRN hydralazine IV --PRN labetalol   Hypokalemia Hypomag --Replete K and Mg --Trend  Fall Patient found down for an unknown duration at apartment.  Moderately dehydrated.  CT cervical spine does not show any cervical fractures.  CT head.  No intracranial abnormality.  Anemia of chronic disease --Hemoglobin stable at 11.8  History of CVA MRI 2/19 showed a tiny acute infarction in the left lateral cerebellum likely subclinical.  No residual deficits. On aspirin at home --Continue ASA stable.  Best practice (right click and "Reselect all  SmartList Selections" daily)  Diet:  Oral Pain/Anxiety/Delirium protocol (if indicated): Yes VAP protocol (if indicated): Not indicated DVT prophylaxis: Lovenox GI prophylaxis: N/A Glucose control: D10 Central venous access:  N/A Arterial line:  N/A Foley:  N/A Mobility:  OOB  PT consulted: N/A Last date of multidisciplinary goals of care discussion[] . Updated daughter on 3/30 Code Status:  full code Disposition: Plan to transfer to [facility]. No ICU needs  Labs   CBC: Recent Labs  Lab 01/21/21 0948 01/21/21 1213 01/22/21 0440 01/23/21 1136 01/24/21 0408  WBC 12.2*  --  12.5* 12.6* 13.5*  NEUTROABS 9.6*  --   --   --   --   HGB 11.8* 12.6 10.5* 11.5* 11.5*  HCT 36.5 37.0 32.7* 34.3* 34.1*  MCV 94.1  --  95.3 94.0 93.2  PLT 233  --  193 219 188    Basic Metabolic Panel: Recent Labs  Lab 01/21/21 0948 01/21/21 1213 01/22/21 0440 01/23/21 1136 01/24/21 0408  NA 142 143 140 138 135  K 4.2 3.6 3.4* 3.6 3.4*  CL 105  --  110 106 105  CO2 28  --  24 21* 20*  GLUCOSE 117*  --  166* 166* 158*  BUN 31*  --  19 10 11   CREATININE 1.79*  --  1.29* 1.16* 1.12*  CALCIUM 9.8  --  8.7* 9.2 9.1  MG  --   --   --   --  1.5*   GFR: Estimated Creatinine Clearance: 54.3 mL/min (A) (by C-G formula based on SCr of 1.12 mg/dL (H)). Recent Labs  Lab 01/21/21 0948 01/21/21 1123 01/21/21 2058 01/22/21 0440 01/23/21 1136 01/24/21 0408  WBC 12.2*  --   --  12.5* 12.6* 13.5*  LATICACIDVEN  --  2.8* 3.3* 1.1  --   --     Liver Function Tests: Recent Labs  Lab 01/21/21 0948 01/23/21 1136  AST 27 27  ALT 16 19  ALKPHOS 88 66  BILITOT 0.7 0.5  PROT 7.5 6.5  ALBUMIN 4.0 3.4*   Recent Labs  Lab 01/23/21 1136  LIPASE 24   Recent Labs  Lab 01/21/21 1204  AMMONIA 27    ABG    Component Value Date/Time   PHART 7.429 01/21/2021 2127   PCO2ART 39.7 01/21/2021 2127   PO2ART 111 (H) 01/21/2021 2127   HCO3 25.7 01/21/2021 2127   TCO2 31 01/21/2021 1213    ACIDBASEDEF 4.8 (H) 03/25/2019 1808   O2SAT 98.0 01/21/2021 2127     Coagulation Profile: Recent Labs  Lab 01/21/21 0948  INR 1.0    Cardiac Enzymes: Recent Labs  Lab 01/21/21 0948  CKTOTAL 281*    HbA1C: Hgb A1c MFr Bld  Date/Time Value Ref Range Status  12/16/2020 02:23 AM 6.3 (H) 4.8 - 5.6 % Final    Comment:    (NOTE) Pre diabetes:          5.7%-6.4%  Diabetes:              >6.4%  Glycemic control for   <7.0% adults with diabetes   04/01/2019 04:51 AM 6.2 (H) 4.8 - 5.6 % Final    Comment:    (NOTE) Pre diabetes:          5.7%-6.4% Diabetes:              >6.4% Glycemic control for   <7.0% adults with diabetes     CBG: Recent Labs  Lab 01/23/21 1541 01/23/21 1926 01/23/21 2337 01/24/21 0319 01/24/21 0726  GLUCAP 138* 120* 150* 171* 137*    Critical care time: N/A    Care Time: 42 Minutes.   Mechele Collin, M.D. [facility] Pulmonary/Critical Care Medicine 01/24/2021 9:11 AM   Please see Amion for pager number to reach on-call Pulmonary and Critical Care Team.

## 2021-01-24 NOTE — Progress Notes (Signed)
Nutrition Follow-up  DOCUMENTATION CODES:   Not applicable  INTERVENTION:   - Boost Breeze po TID, each supplement provides 250 kcal and 9 grams of protein  - Encourage adequate PO intake and provide feeding assistance as needed  NUTRITION DIAGNOSIS:   Inadequate oral intake related to lethargy/confusion as evidenced by NPO status.  Progressing, pt now on Regular diet  GOAL:   Patient will meet greater than or equal to 90% of their needs  Progressing  MONITOR:   Diet advancement,Labs,Weight trends,TF tolerance  REASON FOR ASSESSMENT:   Consult Enteral/tube feeding initiation and management  ASSESSMENT:   70 year old female who presented to the ED on 3/28 with AMS. Pt was found unresponsive on the floor. PMH of HTN, CVA in 2020, anemia, rheumatoid arthritis, CKD stage IIIb. Admitted with sepsis secondary to UTI.  Pt with persistent nausea and vomiting. KUB showing moderate stool. Plan is for enema and hydroxyzine started for nausea. Pt passed swallow evaluation yesterday and advanced to regular diet this morning. RD will discontinue Cortrak order.  Discussed pt with RN and during ICU rounds.  Spoke with pt at bedside. Pt reports ongoing nausea this morning but denies vomiting. She is taking sips of ginger ale at time of RD visit. Pt is unsure whether she will be able to eat any lunch. Given ongoing nausea, RD will order clear liquid oral nutrition supplements to aid pt in meeting kcal and protein needs.  Medications reviewed and include: miralax, IV abx, IV magnesium sulfate 3 grams once IVF: D10 @ 25 ml/hr  Labs reviewed: potassium 3.4, creatinine 1.12, magnesium 1.5 CBG's: 120-171 x 24 hours  UOP: 710 ml x 24 hours I/O's: +4.8 L since admit  Diet Order:   Diet Order            Diet regular Room service appropriate? Yes; Fluid consistency: Thin  Diet effective now                 EDUCATION NEEDS:   Not appropriate for education at this time  Skin:   Skin Assessment: Reviewed RN Assessment  Last BM:  no documented BM  Height:   Ht Readings from Last 1 Encounters:  01/21/21 5\' 7"  (1.702 m)    Weight:   Wt Readings from Last 1 Encounters:  01/21/21 86.7 kg    BMI:  Body mass index is 29.94 kg/m.  Estimated Nutritional Needs:   Kcal:  1800-2000  Protein:  85-100 grams  Fluid:  1.8-2.0 L    01/23/21, MS, RD, LDN Inpatient Clinical Dietitian Please see AMiON for contact information.

## 2021-01-25 LAB — BASIC METABOLIC PANEL
Anion gap: 8 (ref 5–15)
BUN: 16 mg/dL (ref 8–23)
CO2: 22 mmol/L (ref 22–32)
Calcium: 8.5 mg/dL — ABNORMAL LOW (ref 8.9–10.3)
Chloride: 104 mmol/L (ref 98–111)
Creatinine, Ser: 1.42 mg/dL — ABNORMAL HIGH (ref 0.44–1.00)
GFR, Estimated: 40 mL/min — ABNORMAL LOW (ref >60)
Glucose, Bld: 121 mg/dL — ABNORMAL HIGH (ref 70–99)
Potassium: 3.5 mmol/L (ref 3.5–5.1)
Sodium: 134 mmol/L — ABNORMAL LOW (ref 135–145)

## 2021-01-25 LAB — CBC
HCT: 29 % — ABNORMAL LOW (ref 36.0–46.0)
Hemoglobin: 9.8 g/dL — ABNORMAL LOW (ref 12.0–15.0)
MCH: 31.4 pg (ref 26.0–34.0)
MCHC: 33.8 g/dL (ref 30.0–36.0)
MCV: 92.9 fL (ref 80.0–100.0)
Platelets: 187 10*3/uL (ref 150–400)
RBC: 3.12 MIL/uL — ABNORMAL LOW (ref 3.87–5.11)
RDW: 14.5 % (ref 11.5–15.5)
WBC: 9.6 10*3/uL (ref 4.0–10.5)
nRBC: 0 % (ref 0.0–0.2)

## 2021-01-25 LAB — PHOSPHORUS: Phosphorus: 4.1 mg/dL (ref 2.5–4.6)

## 2021-01-25 LAB — GLUCOSE, CAPILLARY
Glucose-Capillary: 106 mg/dL — ABNORMAL HIGH (ref 70–99)
Glucose-Capillary: 111 mg/dL — ABNORMAL HIGH (ref 70–99)
Glucose-Capillary: 111 mg/dL — ABNORMAL HIGH (ref 70–99)
Glucose-Capillary: 117 mg/dL — ABNORMAL HIGH (ref 70–99)
Glucose-Capillary: 121 mg/dL — ABNORMAL HIGH (ref 70–99)
Glucose-Capillary: 126 mg/dL — ABNORMAL HIGH (ref 70–99)
Glucose-Capillary: 127 mg/dL — ABNORMAL HIGH (ref 70–99)

## 2021-01-25 LAB — MAGNESIUM: Magnesium: 2.2 mg/dL (ref 1.7–2.4)

## 2021-01-25 MED ORDER — BACITRACIN-NEOMYCIN-POLYMYXIN OINTMENT TUBE
TOPICAL_OINTMENT | Freq: Two times a day (BID) | CUTANEOUS | Status: DC
  Filled 2021-01-25: qty 14

## 2021-01-25 NOTE — NC FL2 (Signed)
[place] MEDICAID [id number] LEVEL OF CARE SCREENING TOOL     IDENTIFICATION  Patient Name: Michelle Graves Birthdate: [birth year] Sex: female Admission Date (Current Location): 01/21/2021  [facility] and [place] Number:  Producer, television/film/video and Address:  [facility], [address] [place], [place] [address]      Provider Number: [id number]  Attending Physician Name and Address:  Zannie Cove, MD  Relative Name and Phone Number:       Current Level of Care: Hospital Recommended Level of Care: Skilled Nursing Facility Prior Approval Number:    Date Approved/Denied:   PASRR Number: Pending, manual review  Discharge Plan: SNF    Current Diagnoses: Patient Active Problem List   Diagnosis Date Noted  . Acute metabolic encephalopathy 01/21/2021  . Hypertensive urgency 01/21/2021  . Sepsis secondary to UTI (HCC) 01/21/2021  . Adrenal insufficiency (HCC) 12/18/2020  . Encephalopathy 12/17/2020  . TIA (transient ischemic attack) 12/15/2020  . 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 Bowel obstruction (HCC) 05/06/2020  . SIRS (systemic inflammatory response syndrome) (HCC) 05/06/2020  . UTI (urinary tract infection) 11/08/2019  . Major depressive disorder, recurrent episode, moderate (HCC) 11/08/2019  . Urinary retention 10/28/2019  . Acute on chronic kidney failure (HCC) 10/27/2019  . Malnutrition of moderate degree 10/03/2019  . Colitis 09/30/2019  . AKI (acute kidney injury) (HCC) 05/28/2019  . Nausea & vomiting 05/28/2019  . 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 Anemia, chronic disease 05/28/2019  . Swelling   . Small vessel disease, cerebrovascular   . Acute ischemic stroke (HCC)   . Weakness   . Acute left ankle pain   . Acute on chronic renal failure (HCC) 05/13/2019  . Weakness of both lower extremities 05/13/2019  . Cerebral thrombosis with cerebral infarction 03/31/2019  . New onset a-fib (HCC) 03/27/2019  . Acute kidney injury superimposed on  CKD (HCC) 03/24/2019  . Hypotension 03/24/2019  . Hematemesis 03/24/2019  . Dark stools 03/24/2019  . Diverticulitis of intestine without perforation or abscess without bleeding   . Sinus tachycardia 08/27/2016  . Diverticulitis 08/26/2016  . HYPERTENSION, MALIGNANT ESSENTIAL 06/24/2007  . CKD (chronic kidney disease), stage III (HCC) 06/24/2007  . Rheumatoid arthritis (HCC) 06/24/2007    Orientation RESPIRATION BLADDER Height & Weight     Self  Normal Incontinent Weight: 191 lb 2.2 oz (86.7 kg) Height:  5\' 7"  (170.2 cm)  BEHAVIORAL SYMPTOMS/MOOD NEUROLOGICAL BOWEL NUTRITION STATUS      Continent Diet (see DC summary)  AMBULATORY STATUS COMMUNICATION OF NEEDS Skin   Limited Assist Verbally Skin abrasions (abrasions on face)                       Personal Care Assistance Level of Assistance  Bathing,Feeding,Dressing Bathing Assistance: Limited assistance Feeding assistance: Limited assistance Dressing Assistance: Limited assistance     Functional Limitations Info  Speech     Speech Info: Impaired (dysarthria)    SPECIAL CARE FACTORS FREQUENCY  PT (By licensed PT),OT (By licensed OT)     PT Frequency: 5x/wk OT Frequency: 5x/wk            Contractures Contractures Info: Not present    Additional Factors Info  Code Status,Allergies Code Status Info: Full Allergies Info: NKA           Current Medications (01/25/2021):  This is the current hospital active medication list Current Facility-Administered Medications  Medication Dose Route Frequency Provider Last Rate Last Admin  .  acetaminophen (TYLENOL) tablet 650 mg  650 mg Oral Q6H PRN Madelyn Flavors A, MD   650 mg at 01/23/21 1717   Or  . acetaminophen (TYLENOL) suppository 650 mg  650 mg Rectal Q6H PRN Smith, Rondell A, MD      . albuterol (PROVENTIL) (2.5 MG/3ML) 0.083% nebulizer solution 2.5 mg  2.5 mg Nebulization Q6H PRN Smith, Rondell A, MD      . carvedilol (COREG) tablet 25 mg  25 mg Oral BID WC  Luciano Cutter, MD   25 mg at 01/25/21 0859  . Chlorhexidine Gluconate Cloth 2 % PADS 6 each  6 each Topical Q0600 Clydie Braun, MD   6 each at 01/24/21 1152  . diltiazem (CARDIZEM CD) 24 hr capsule 180 mg  180 mg Oral BID Luciano Cutter, MD   180 mg at 01/25/21 0901  . enoxaparin (LOVENOX) injection 40 mg  40 mg Subcutaneous Q24H Smith, Rondell A, MD   40 mg at 01/25/21 1409  . feeding supplement (BOOST / RESOURCE BREEZE) liquid 1 Container  1 Container Oral TID BM Luciano Cutter, MD   1 Container at 01/25/21 1412  . labetalol (NORMODYNE) injection 10-20 mg  10-20 mg Intravenous Q2H PRN Oretha Milch, MD   20 mg at 01/24/21 0014  . polyethylene glycol (MIRALAX / GLYCOLAX) packet 17 g  17 g Oral Daily Luciano Cutter, MD   17 g at 01/25/21 0904  . potassium chloride (KLOR-CON) packet 40 mEq  40 mEq Oral Once Luciano Cutter, MD      . sodium chloride flush (NS) 0.9 % injection 3 mL  3 mL Intravenous Q12H Madelyn Flavors A, MD   3 mL at 01/25/21 9381     Discharge Medications: Please see discharge summary for a list of discharge medications.  Relevant Imaging Results:  Relevant Lab Results:   Additional Information SS#: [id number]  Baldemar Lenis, LCSW

## 2021-01-25 NOTE — Evaluation (Signed)
Physical Therapy Evaluation Patient Details Name: Michelle Graves MRN: [id number] DOB: 1951/09/30 Today's Date: 01/25/2021   History of Present Illness  70 y/o female presented to ED on 3/28 after being found on floor and unresponsive. Recently hospitalized 1/31-2/5, 2/12-2/15, and 2/19-2/23 for UTIs and encephalopathy. Patient found to have UTI and multifactorial cause of acute metabolic encephalopathy. PMH: cerebellar stroke, HTN, recurrent UTI, diverticulitis, anemia, CKD, RA  Clinical Impression  Difficult to gather information about PLOF and home setup due to impaired cognition. Oriented to self and time. Fluctuating with ability to identify place as patient was able to correctly identify place, however throughout made statements about home and daughter's home. Patient overall minA for mobility with RW. Patient with difficulty following multi-step commands. Patient presents with generalized weakness, impaired balance, decreased activity tolerance, and impaired functional mobility. Patient will benefit from skilled PT services during acute stay to address listed deficits. Recommend SNF following discharge to maximize functional mobility and safety. If family able to provide 24/7 supervision, patient may be able to discharge home with HHPT. Will continue to assess.     Follow Up Recommendations SNF;Supervision/Assistance - 24 hour    Equipment Recommendations  None recommended by PT    Recommendations for Other Services       Precautions / Restrictions Precautions Precautions: Fall Restrictions Weight Bearing Restrictions: No      Mobility  Bed Mobility Overal bed mobility: Needs Assistance Bed Mobility: Supine to Sit;Sit to Supine     Supine to sit: Min assist Sit to supine: Min assist   General bed mobility comments: minA to bring LEs on and off bed. Able to reposition hips towards EOB    Transfers Overall transfer level: Needs assistance Equipment used: Rolling Khoa Opdahl (2  wheeled) Transfers: Sit to/from Stand Sit to Stand: Min assist         General transfer comment: minA for boost up. Good hand placement without cueing  Ambulation/Gait Ambulation/Gait assistance: Min assist Gait Distance (Feet): 20 Feet Assistive device: Rolling Jhett Fretwell (2 wheeled) Gait Pattern/deviations: Step-to pattern;Decreased stride length;Trunk flexed;Wide base of support Gait velocity: decreased   General Gait Details: Assist for balance and RW management as patient tends to push RW out too far in front  Stairs            Wheelchair Mobility    Modified Rankin (Stroke Patients Only)       Balance Overall balance assessment: Mild deficits observed, not formally tested                                           Pertinent Vitals/Pain Pain Assessment: No/denies pain    Home Living Family/patient expects to be discharged to:: Private residence Living Arrangements: Alone Available Help at Discharge: Family;Available PRN/intermittently           Home Equipment: Domonic Kimball - 2 wheels;Amal Renbarger - 4 wheels;Cane - quad;Shower seat;Bedside commode;Wheelchair - manual;Grab bars - toilet;Grab bars - tub/shower;Toilet riser;Other (comment) Additional Comments: Unsure of home setup as patient with impaired cognition    Prior Function Level of Independence: Independent with assistive device(s);Needs assistance   Gait / Transfers Assistance Needed: rollator in community, RW in house  ADL's / Homemaking Assistance Needed: daughter assists with medication, cooking, cleaning. pt bathes and dresses on her own  Comments: PLOF gathered from chart review. Patient unable to provide PLOF except that she owns RW and rollator  Hand Dominance        Extremity/Trunk Assessment   Upper Extremity Assessment Upper Extremity Assessment: Generalized weakness    Lower Extremity Assessment Lower Extremity Assessment: Generalized weakness       Communication    Communication: No difficulties  Cognition Arousal/Alertness: Awake/alert Behavior During Therapy: WFL for tasks assessed/performed Overall Cognitive Status: No family/caregiver present to determine baseline cognitive functioning Area of Impairment: Orientation;Attention;Memory;Following commands;Safety/judgement;Awareness;Problem solving                 Orientation Level: Disoriented to;Place;Situation Current Attention Level: Sustained Memory: Decreased short-term memory Following Commands: Follows one step commands inconsistently;Follows one step commands with increased time Safety/Judgement: Decreased awareness of safety;Decreased awareness of deficits Awareness: Intellectual Problem Solving: Slow processing;Requires verbal cues General Comments: Initially patient able to state "[facility]" when asked place. Throughout session, patient fluctuates and states "who's in my house?" and "my daughter's house" but able to state correct place when asked where she was. Patient with decreased safety awareness and awareness of deficits.      General Comments      Exercises     Assessment/Plan    PT Assessment Patient needs continued PT services  PT Problem List Decreased strength;Decreased activity tolerance;Decreased mobility;Decreased balance;Decreased cognition;Decreased safety awareness       PT Treatment Interventions DME instruction;Gait training;Stair training;Functional mobility training;Therapeutic activities;Therapeutic exercise;Balance training;Patient/family education    PT Goals (Current goals can be found in the Care Plan section)  Acute Rehab PT Goals Patient Stated Goal: did not state PT Goal Formulation: Patient unable to participate in goal setting Time For Goal Achievement: 02/08/21 Potential to Achieve Goals: Fair    Frequency Min 3X/week   Barriers to discharge        Co-evaluation               AM-PAC PT "6 Clicks" Mobility   Outcome Measure Help needed turning from your back to your side while in a flat bed without using bedrails?: A Little Help needed moving from lying on your back to sitting on the side of a flat bed without using bedrails?: A Little Help needed moving to and from a bed to a chair (including a wheelchair)?: A Little Help needed standing up from a chair using your arms (e.g., wheelchair or bedside chair)?: A Little Help needed to walk in hospital room?: A Little Help needed climbing 3-5 steps with a railing? : A Lot 6 Click Score: 17    End of Session Equipment Utilized During Treatment: Gait belt Activity Tolerance: Patient tolerated treatment well Patient left: in bed;with call bell/phone within reach;with bed alarm set Nurse Communication: Mobility status PT Visit Diagnosis: Unsteadiness on feet (R26.81);Muscle weakness (generalized) (M62.81);Difficulty in walking, not elsewhere classified (R26.2);History of falling (Z91.81)    Time: 7017-7939 PT Time Calculation (min) (ACUTE ONLY): 20 min   Charges:   PT Evaluation $PT Eval Moderate Complexity: 1 Mod          Ayaansh Smail A. Dan Humphreys PT, DPT Acute Rehabilitation Services Pager [phone] Office [phone]   Viviann Spare 01/25/2021, 1:24 PM

## 2021-01-25 NOTE — Care Management Important Message (Signed)
Important Message  Patient Details  Name: Michelle Graves MRN: [id number] Date of Birth: [birth year]   Medicare Important Message Given:  Yes     Dorena Bodo 01/25/2021, 4:02 PM

## 2021-01-25 NOTE — TOC Initial Note (Signed)
Transition of Care [facility]) - Initial/Assessment Note    Patient Details  Name: Michelle Graves MRN: [id number] Date of Birth: 06/04/1951  Transition of Care [facility]) CM/SW Contact:    Baldemar Lenis, LCSW Phone Number: 01/25/2021, 3:04 PM  Clinical Narrative:       CSW spoke with patient's daughter, [name], over the phone to discuss recommendation for SNF. [name] in agreement, says the best place that her mother has been to has been [facility] and would like to go back there. [name] says she works from 8-5 so will not be able to provide 24/7 and realizes that it's likely gotten to the point where they need to figure out caregiving support or having her mother move in somewhere, which they don't want to have to do. CSW discussed looking into Medicaid for the patient to see if they can assist with payment for any caregivers or living facilities after SNF. CSW completed referral and sent to [facility] for review. CSW to follow.            Expected Discharge Plan: Skilled Nursing Facility Barriers to Discharge: Continued Medical Work up,Insurance Authorization   Patient Goals and CMS Choice Patient states their goals for this hospitalization and ongoing recovery are:: patient unable to participate in goal setting, only oriented to self CMS Medicare.gov Compare Post Acute Care list provided to:: Patient Represenative (must comment) Choice offered to / list presented to : Adult Children  Expected Discharge Plan and Services Expected Discharge Plan: Skilled Nursing Facility     Post Acute Care Choice: Skilled Nursing Facility Living arrangements for the past 2 months: Apartment                                      Prior Living Arrangements/Services Living arrangements for the past 2 months: Apartment Lives with:: Self Patient language and need for interpreter reviewed:: No Do you feel safe going back to the place where you live?: Yes      Need for Family Participation in Patient Care:  Yes (Comment) Care giver support system in place?: No (comment)   Criminal Activity/Legal Involvement Pertinent to Current Situation/Hospitalization: No - Comment as needed  Activities of Daily Living      Permission Sought/Granted Permission sought to share information with : Facility Contractor granted to share information with : Yes, Verbal Permission Granted  Share Information with NAME: [name]  Permission granted to share info w AGENCY: SNF  Permission granted to share info w Relationship: Daughter     Emotional Assessment Appearance:: Appears stated age Attitude/Demeanor/Rapport: Unable to Assess Affect (typically observed): Unable to Assess Orientation: : Oriented to Self Alcohol / Substance Use: Not Applicable Psych Involvement: No (comment)  Admission diagnosis:  Encephalopathy acute [G93.40] Acute cystitis without hematuria [N30.00] Acute metabolic encephalopathy [G93.41] Sepsis, due to unspecified organism, unspecified whether acute organ dysfunction present [facility]) [A41.9] Patient Active Problem List   Diagnosis Date Noted  . Acute metabolic encephalopathy 01/21/2021  . Hypertensive urgency 01/21/2021  . Sepsis secondary to UTI (HCC) 01/21/2021  . Adrenal insufficiency (HCC) 12/18/2020  . Encephalopathy 12/17/2020  . TIA (transient ischemic attack) 12/15/2020  . 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 Bowel obstruction (HCC) 05/06/2020  . SIRS (systemic inflammatory response syndrome) (HCC) 05/06/2020  . UTI (urinary tract infection) 11/08/2019  . Major depressive disorder, recurrent episode, moderate (HCC) 11/08/2019  . Urinary retention 10/28/2019  .  Acute on chronic kidney failure (HCC) 10/27/2019  . Malnutrition of moderate degree 10/03/2019  . Colitis 09/30/2019  . AKI (acute kidney injury) (HCC) 05/28/2019  . Nausea & vomiting 05/28/2019  . 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 Anemia, chronic disease 05/28/2019  .  Swelling   . Small vessel disease, cerebrovascular   . Acute ischemic stroke (HCC)   . Weakness   . Acute left ankle pain   . Acute on chronic renal failure (HCC) 05/13/2019  . Weakness of both lower extremities 05/13/2019  . Cerebral thrombosis with cerebral infarction 03/31/2019  . New onset a-fib (HCC) 03/27/2019  . Acute kidney injury superimposed on CKD (HCC) 03/24/2019  . Hypotension 03/24/2019  . Hematemesis 03/24/2019  . Dark stools 03/24/2019  . Diverticulitis of intestine without perforation or abscess without bleeding   . Sinus tachycardia 08/27/2016  . Diverticulitis 08/26/2016  . HYPERTENSION, MALIGNANT ESSENTIAL 06/24/2007  . CKD (chronic kidney disease), stage III (HCC) 06/24/2007  . Rheumatoid arthritis (HCC) 06/24/2007   PCP:  [facility]:   [id number] - [name], [place] - [address] AT [facility] OF [address] [place] [address] Phone: [phone] Fax: [phone]     Social Determinants of Health (SDOH) Interventions    Readmission Risk Interventions Readmission Risk Prevention Plan 05/10/2020 05/10/2020 10/28/2019  Transportation Screening - Complete Complete  PCP or Specialist Appt within 3-5 Days - - -  Not Complete comments - - -  HRI or Home Care Consult - - -  HRI or Home Care Consult comments - - -  Social Work Consult for Recovery Care Planning/Counseling - - -  Palliative Care Screening - - -  Medication Review [facility]) - Referral to Pharmacy Referral to Pharmacy  PCP or Specialist appointment within 3-5 days of discharge Complete Complete Not Complete  PCP/Specialist Appt Not Complete comments - - due to holiday, pt to make appointment  HRI or Home Care Consult - Complete Complete  SW Recovery Care/Counseling Consult - - Complete  Palliative Care Screening - Not Applicable Not Applicable  Skilled Nursing Facility - Patient Refused  Complete  Some recent data might be hidden

## 2021-01-25 NOTE — Progress Notes (Signed)
PROGRESS NOTE    Michelle Graves  ZOX:[id number] DOB: [birth year] DOA: 01/21/2021 PCP: [facility]  Brief Narrative: 70 year old female with history of hypertension, CVA in 2028, chronic anemia, rheumatoid arthritis, chronic kidney disease 3B was found unresponsive on the floor and was brought to the ED by EMS 3/28. -Daughter had talked to her at the previous day when she was in her usual state of health, on 3/28 she was found on the floor poorly responsive and brought to the ED. recently hospitalized 2/19-2/23 with acute metabolic encephalopathy) secondary to hypoglycemia, CVA and/or medication she was noted to have a left lateral cerebellar infarct on MRI then -This time in the ED she was afebrile, hypertensive, with fluctuating O2 sats, hypoglycemic, CT head and cervical spine were unremarkable, labs noted mild leukocytosis, abnormal urinalysis, she was agitated after she woke up and subsequently given Ativan and Versed, subsequently became more delirious and was admitted to the ICU on Precedex. -Did not require mechanical ventilation, transferred from PCCM to [facility] service today 4/1   Assessment & Plan:     Acute metabolic encephalopathy -Multifactorial, hypoglycemia, UTI, polypharmacy -May also have a component of cognitive deficits, memory loss at baseline, possible vascular dementia, this was noted -CBGs are stable, discontinue D10 -Urine culture with staph epidermidis, off ceftriaxone since yesterday -Blood culture notes staph epidermis is in 1 out of 2 sets, significance is unknown, repeat blood cultures are negative -Seroquel on hold -Ambulate, PT OT eval  Sepsis, UTI -Treated with IV Rocephin x5 days, urine culture with staph epi -Staph epidermidis and hominis, likely contaminant and blood cultures -Repeat blood cultures are negative -Monitor off antibiotics now afebrile without leukocytosis  Acute kidney injury Intermittent urinary  retention -Baseline creatinine around 1.4-1.5, creatinine was 1.7 on admission -Stable now -Discontinue Foley catheter and monitor  Constipation Chronic colonic stricture -Continue laxatives daily, had a BM yesterday after enema  Hypertension -Continue Coreg and diltiazem, hold clonidine  Hypokalemia Hypomagnesemia -Repleted  History of CVA -Continue aspirin -PT eval  Chronic anemia -Hemoglobin slightly lower possibly from hemodilution, continue to trend  DVT prophylaxis: Lovenox Code Status: Full code Family Communication: No family at bedside Disposition Plan:  Status is: Inpatient  Remains inpatient appropriate because:Inpatient level of care appropriate due to severity of illness   Dispo: The patient is from: Home              Anticipated d/c is to: Home              Patient currently is not medically stable to d/c.   Difficult to place patient No  Consultants:  Procedures:   Antimicrobials:    Subjective: -Feels okay, no events overnight, blood pressure running lower  Objective: Vitals:   01/24/21 2202 01/25/21 0003 01/25/21 0441 01/25/21 0900  BP: (!) 90/59 (!) 96/49 100/64 119/78  Pulse:  72  71  Resp:   18 19  Temp:  98.6 F (37 C) 98.5 F (36.9 C) 98.6 F (37 C)  TempSrc:  Oral  Oral  SpO2:  99% 98% 99%  Weight:      Height:        Intake/Output Summary (Last 24 hours) at 01/25/2021 1107 Last data filed at 01/25/2021 0630 Gross per 24 hour  Intake 1184.1 ml  Output 470 ml  Net 714.1 ml   Filed Weights   01/21/21 0924 01/21/21 2200  Weight: 86.5 kg 86.7 kg    Examination:  General exam: Pleasant chronically ill female  laying in bed, awake alert oriented to self and partly to place, some cognitive deficits noted CVS: S1-S2, regular rate rhythm Lungs: Decreased breath sounds the bases otherwise clear Abdomen: Soft bowel sounds present Extremities: No edema GU: Foley catheter noted Neuro: Moves all extremities, no localizing signs   Psychiatry: Poor insight and judgment    Data Reviewed:   CBC: Recent Labs  Lab 01/21/21 0948 01/21/21 1213 01/22/21 0440 01/23/21 1136 01/24/21 0408 01/25/21 0449  WBC 12.2*  --  12.5* 12.6* 13.5* 9.6  NEUTROABS 9.6*  --   --   --   --   --   HGB 11.8* 12.6 10.5* 11.5* 11.5* 9.8*  HCT 36.5 37.0 32.7* 34.3* 34.1* 29.0*  MCV 94.1  --  95.3 94.0 93.2 92.9  PLT 233  --  193 219 188 187   Basic Metabolic Panel: Recent Labs  Lab 01/21/21 0948 01/21/21 1213 01/22/21 0440 01/23/21 1136 01/24/21 0408 01/25/21 0449  NA 142 143 140 138 135 134*  K 4.2 3.6 3.4* 3.6 3.4* 3.5  CL 105  --  110 106 105 104  CO2 28  --  24 21* 20* 22  GLUCOSE 117*  --  166* 166* 158* 121*  BUN 31*  --  CREATININE 1.79*  --  1.29* 1.16* 1.12* 1.42*  CALCIUM 9.8  --  8.7* 9.2 9.1 8.5*  MG  --   --   --   --  1.5* 2.2  PHOS  --   --   --   --   --  4.1   GFR: Estimated Creatinine Clearance: 42.9 mL/min (A) (by C-G formula based on SCr of 1.42 mg/dL (H)). Liver Function Tests: Recent Labs  Lab 01/21/21 0948 01/23/21 1136  AST 27 27  ALT 16 19  ALKPHOS 88 66  BILITOT 0.7 0.5  PROT 7.5 6.5  ALBUMIN 4.0 3.4*   Recent Labs  Lab 01/23/21 1136  LIPASE 24   Recent Labs  Lab 01/21/21 1204  AMMONIA 27   Coagulation Profile: Recent Labs  Lab 01/21/21 0948  INR 1.0   Cardiac Enzymes: Recent Labs  Lab 01/21/21 0948  CKTOTAL 281*   BNP (last 3 results) No results for input(s): PROBNP in the last 8760 hours. HbA1C: No results for input(s): HGBA1C in the last 72 hours. CBG: Recent Labs  Lab 01/24/21 1135 01/24/21 2034 01/25/21 0007 01/25/21 0432 01/25/21 0828  GLUCAP 158* 140* 126* 117* 127*   Lipid Profile: No results for input(s): CHOL, HDL, LDLCALC, TRIG, CHOLHDL, LDLDIRECT in the last 72 hours. Thyroid Function Tests: No results for input(s): TSH, T4TOTAL, FREET4, T3FREE, THYROIDAB in the last 72 hours. Anemia Panel: No results for input(s): VITAMINB12,  FOLATE, FERRITIN, TIBC, IRON, RETICCTPCT in the last 72 hours. Urine analysis:    Component Value Date/Time   COLORURINE AMBER (A) 01/21/2021 0948   APPEARANCEUR CLOUDY (A) 01/21/2021 0948   LABSPEC 1.018 01/21/2021 0948   PHURINE 6.0 01/21/2021 0948   GLUCOSEU NEGATIVE 01/21/2021 0948   HGBUR NEGATIVE 01/21/2021 0948   BILIRUBINUR NEGATIVE 01/21/2021 0948   KETONESUR NEGATIVE 01/21/2021 0948   PROTEINUR 100 (A) 01/21/2021 0948   UROBILINOGEN 1.0 06/24/2010 1850   NITRITE POSITIVE (A) 01/21/2021 0948   LEUKOCYTESUR LARGE (A) 01/21/2021 0948   Sepsis Labs: (procalcitonin:4,lacticidven:4)  ) Recent Results (from the past 240 hour(s))  Blood Cultures (routine x 2)     Status: Abnormal   Collection Time: 01/21/21  9:48 AM  Specimen: BLOOD  Result Value Ref Range Status   Specimen Description BLOOD SITE NOT SPECIFIED  Final   Special Requests   Final    BOTTLES DRAWN AEROBIC AND ANAEROBIC Blood Culture adequate volume   Culture  Setup Time   Final    IN BOTH AEROBIC AND ANAEROBIC BOTTLES GRAM POSITIVE COCCI CRITICAL RESULT CALLED TO, READ BACK BY AND VERIFIED WITH: PHARMD GREG A. [id number] FCP    Culture (A)  Final    STAPHYLOCOCCUS HOMINIS STAPHYLOCOCCUS EPIDERMIDIS THE SIGNIFICANCE OF ISOLATING THIS ORGANISM FROM A SINGLE SET OF BLOOD CULTURES WHEN MULTIPLE SETS ARE DRAWN IS UNCERTAIN. PLEASE NOTIFY THE MICROBIOLOGY DEPARTMENT WITHIN ONE WEEK IF SPECIATION AND SENSITIVITIES ARE REQUIRED. Performed at [facility], [address], [place], [place] [address]    Report Status 01/24/2021 FINAL  Final  Urine culture     Status: Abnormal   Collection Time: 01/21/21  9:48 AM   Specimen: Urine, Random  Result Value Ref Range Status   Specimen Description URINE, RANDOM  Final   Special Requests   Final    NONE Performed at [facility], [address], [place], [place] [address]    Culture >=100,000 COLONIES/mL STAPHYLOCOCCUS EPIDERMIDIS (A)  Final    Report Status 01/23/2021 FINAL  Final   Organism ID, Bacteria STAPHYLOCOCCUS EPIDERMIDIS (A)  Final      Susceptibility   Staphylococcus epidermidis - MIC*    CIPROFLOXACIN 2 INTERMEDIATE Intermediate     GENTAMICIN 8 INTERMEDIATE Intermediate     NITROFURANTOIN <=16 SENSITIVE Sensitive     OXACILLIN >=4 RESISTANT Resistant     TETRACYCLINE <=1 SENSITIVE Sensitive     VANCOMYCIN 1 SENSITIVE Sensitive     TRIMETH/SULFA 20 SENSITIVE Sensitive     CLINDAMYCIN <=0.25 SENSITIVE Sensitive     RIFAMPIN <=0.5 SENSITIVE Sensitive     Inducible Clindamycin NEGATIVE Sensitive     * >=100,000 COLONIES/mL STAPHYLOCOCCUS EPIDERMIDIS  Blood Culture ID Panel (Reflexed)     Status: Abnormal   Collection Time: 01/21/21  9:48 AM  Result Value Ref Range Status   Enterococcus faecalis NOT DETECTED NOT DETECTED Final   Enterococcus Faecium NOT DETECTED NOT DETECTED Final   Listeria monocytogenes NOT DETECTED NOT DETECTED Final   Staphylococcus species DETECTED (A) NOT DETECTED Final    Comment: CRITICAL RESULT CALLED TO, READ BACK BY AND VERIFIED WITH: PHARMD GREG A. [phone] [id number] FCP    Staphylococcus aureus (BCID) NOT DETECTED NOT DETECTED Final   Staphylococcus epidermidis DETECTED (A) NOT DETECTED Final    Comment: Methicillin (oxacillin) resistant coagulase negative staphylococcus. Possible blood culture contaminant (unless isolated from more than one blood culture draw or clinical case suggests pathogenicity). No antibiotic treatment is indicated for blood  culture contaminants. CRITICAL RESULT CALLED TO, READ BACK BY AND VERIFIED WITH: PHARMD GREG A. [id number] FCP    Staphylococcus lugdunensis NOT DETECTED NOT DETECTED Final   Streptococcus species NOT DETECTED NOT DETECTED Final   Streptococcus agalactiae NOT DETECTED NOT DETECTED Final   Streptococcus pneumoniae NOT DETECTED NOT DETECTED Final   Streptococcus pyogenes NOT DETECTED NOT DETECTED Final   A.calcoaceticus-baumannii NOT DETECTED  NOT DETECTED Final   Bacteroides fragilis NOT DETECTED NOT DETECTED Final   Enterobacterales NOT DETECTED NOT DETECTED Final   Enterobacter cloacae complex NOT DETECTED NOT DETECTED Final   Escherichia coli NOT DETECTED NOT DETECTED Final   Klebsiella aerogenes NOT DETECTED NOT DETECTED Final   Klebsiella oxytoca NOT DETECTED NOT DETECTED Final  Klebsiella pneumoniae NOT DETECTED NOT DETECTED Final   Proteus species NOT DETECTED NOT DETECTED Final   Salmonella species NOT DETECTED NOT DETECTED Final   Serratia marcescens NOT DETECTED NOT DETECTED Final   Haemophilus influenzae NOT DETECTED NOT DETECTED Final   Neisseria meningitidis NOT DETECTED NOT DETECTED Final   Pseudomonas aeruginosa NOT DETECTED NOT DETECTED Final   Stenotrophomonas maltophilia NOT DETECTED NOT DETECTED Final   Candida albicans NOT DETECTED NOT DETECTED Final   Candida auris NOT DETECTED NOT DETECTED Final   Candida glabrata NOT DETECTED NOT DETECTED Final   Candida krusei NOT DETECTED NOT DETECTED Final   Candida parapsilosis NOT DETECTED NOT DETECTED Final   Candida tropicalis NOT DETECTED NOT DETECTED Final   Cryptococcus neoformans/gattii NOT DETECTED NOT DETECTED Final   Methicillin resistance mecA/C DETECTED (A) NOT DETECTED Final    Comment: CRITICAL RESULT CALLED TO, READ BACK BY AND VERIFIED WITH: PHARMD GREG A. [id number] FCP Performed at [facility], [address], [place], [place] [address]   Blood Cultures (routine x 2)     Status: None (Preliminary result)   Collection Time: 01/21/21  9:53 AM   Specimen: BLOOD RIGHT HAND  Result Value Ref Range Status   Specimen Description BLOOD RIGHT HAND  Final   Special Requests   Final    BOTTLES DRAWN AEROBIC ONLY Blood Culture results may not be optimal due to an inadequate volume of blood received in culture bottles   Culture   Final    NO GROWTH 3 DAYS Performed at [facility], [address], [place], [place] [address]    Report  Status PENDING  Incomplete  MRSA PCR Screening     Status: None   Collection Time: 01/21/21 10:59 PM   Specimen: Nasal Mucosa; Nasopharyngeal  Result Value Ref Range Status   MRSA by PCR NEGATIVE NEGATIVE Final    Comment:        The GeneXpert MRSA Assay (FDA approved for NASAL specimens only), is one component of a comprehensive MRSA colonization surveillance program. It is not intended to diagnose MRSA infection nor to guide or monitor treatment for MRSA infections. Performed at [facility], [address], [place], [place] [address]   SARS CORONAVIRUS 2 (TAT 6-24 HRS) Nasopharyngeal Nasopharyngeal Swab     Status: None   Collection Time: 01/21/21 11:14 PM   Specimen: Nasopharyngeal Swab  Result Value Ref Range Status   SARS Coronavirus 2 NEGATIVE NEGATIVE Final    Comment: (NOTE) SARS-CoV-2 target nucleic acids are NOT DETECTED.  The SARS-CoV-2 RNA is generally detectable in upper and lower respiratory specimens during the acute phase of infection. Negative results do not preclude SARS-CoV-2 infection, do not rule out co-infections with other pathogens, and should not be used as the sole basis for treatment or other patient management decisions. Negative results must be combined with clinical observations, patient history, and epidemiological information. The expected result is Negative.  Fact Sheet for Patients: HairSlick.no  Fact Sheet for Healthcare Providers: quierodirigir.com  This test is not yet approved or cleared by the [place] FDA and  has been authorized for detection and/or diagnosis of SARS-CoV-2 by FDA under an Emergency Use Authorization (EUA). This EUA will remain  in effect (meaning this test can be used) for the duration of the COVID-19 declaration under Se ction 564(b)(1) of the Act, 21 U.S.C. section 360bbb-3(b)(1), unless the authorization is terminated or revoked  sooner.  Performed at [facility]  Lab, [address] [place], [place] [address]   Culture, blood (routine x 2)     Status: None (Preliminary result)   Collection Time: 01/22/21 11:14 AM   Specimen: BLOOD LEFT HAND  Result Value Ref Range Status   Specimen Description BLOOD LEFT HAND  Final   Special Requests   Final    BOTTLES DRAWN AEROBIC AND ANAEROBIC Blood Culture results may not be optimal due to an inadequate volume of blood received in culture bottles   Culture   Final    NO GROWTH 2 DAYS Performed at [facility], [address] [place], [place] [address]    Report Status PENDING  Incomplete  Culture, blood (routine x 2)     Status: None (Preliminary result)   Collection Time: 01/22/21 11:16 AM   Specimen: BLOOD LEFT ARM  Result Value Ref Range Status   Specimen Description BLOOD LEFT ARM  Final   Special Requests   Final    BOTTLES DRAWN AEROBIC AND ANAEROBIC Blood Culture results may not be optimal due to an inadequate volume of blood received in culture bottles   Culture   Final    NO GROWTH 2 DAYS Performed at [facility], [address], [place], [place] [address]    Report Status PENDING  Incomplete         Radiology Studies: DG Abd 1 View  Result Date: 01/23/2021 CLINICAL DATA:  Abdominal pain. EXAM: ABDOMEN - 1 VIEW COMPARISON:  CT 12/15/2020 FINDINGS: Portable supine view of the abdomen obtained. Normal bowel gas pattern. No evidence of free air in the supine view. Moderate stool in the ascending and rectosigmoid colon. Rectum mildly distended with stool. No radiopaque calculi or abnormal soft tissue calcifications. Pelvic calcifications correspond to phleboliths on prior exam. No evidence of acute osseous abnormality. IMPRESSION: Moderate stool in the ascending and rectosigmoid colon. No bowel obstruction. Electronically Signed   By: Narda Rutherford M.D.   On: 01/23/2021 17:00    Scheduled Meds: . carvedilol  25 mg Oral BID WC  .  Chlorhexidine Gluconate Cloth  6 each Topical Q0600  . diltiazem  180 mg Oral BID  . enoxaparin (LOVENOX) injection  40 mg Subcutaneous Q24H  . feeding supplement  1 Container Oral TID BM  . polyethylene glycol  17 g Oral Daily  . potassium chloride  40 mEq Oral Once  . sodium chloride flush  3 mL Intravenous Q12H   Continuous Infusions: . dextrose 25 mL/hr at 01/24/21 1800     LOS: 4 days    Time spent:  Zannie Cove, MD [facility]  01/25/2021, 11:07 AM

## 2021-01-25 NOTE — Plan of Care (Signed)

## 2021-01-26 ENCOUNTER — Inpatient Hospital Stay (HOSPITAL_COMMUNITY): Payer: Medicare Other

## 2021-01-26 LAB — CBC
HCT: 30.4 % — ABNORMAL LOW (ref 36.0–46.0)
Hemoglobin: 10 g/dL — ABNORMAL LOW (ref 12.0–15.0)
MCH: 31 pg (ref 26.0–34.0)
MCHC: 32.9 g/dL (ref 30.0–36.0)
MCV: 94.1 fL (ref 80.0–100.0)
Platelets: 211 10*3/uL (ref 150–400)
RBC: 3.23 MIL/uL — ABNORMAL LOW (ref 3.87–5.11)
RDW: 14.4 % (ref 11.5–15.5)
WBC: 8.4 10*3/uL (ref 4.0–10.5)
nRBC: 0 % (ref 0.0–0.2)

## 2021-01-26 LAB — HEMOGLOBIN A1C
Hgb A1c MFr Bld: 6.4 % — ABNORMAL HIGH (ref 4.8–5.6)
Mean Plasma Glucose: 136.98 mg/dL

## 2021-01-26 LAB — BASIC METABOLIC PANEL
Anion gap: 10 (ref 5–15)
BUN: 22 mg/dL (ref 8–23)
CO2: 20 mmol/L — ABNORMAL LOW (ref 22–32)
Calcium: 8.4 mg/dL — ABNORMAL LOW (ref 8.9–10.3)
Chloride: 104 mmol/L (ref 98–111)
Creatinine, Ser: 1.46 mg/dL — ABNORMAL HIGH (ref 0.44–1.00)
GFR, Estimated: 39 mL/min — ABNORMAL LOW (ref >60)
Glucose, Bld: 87 mg/dL (ref 70–99)
Potassium: 4 mmol/L (ref 3.5–5.1)
Sodium: 134 mmol/L — ABNORMAL LOW (ref 135–145)

## 2021-01-26 LAB — GLUCOSE, CAPILLARY
Glucose-Capillary: 104 mg/dL — ABNORMAL HIGH (ref 70–99)
Glucose-Capillary: 113 mg/dL — ABNORMAL HIGH (ref 70–99)
Glucose-Capillary: 131 mg/dL — ABNORMAL HIGH (ref 70–99)
Glucose-Capillary: 145 mg/dL — ABNORMAL HIGH (ref 70–99)
Glucose-Capillary: 145 mg/dL — ABNORMAL HIGH (ref 70–99)
Glucose-Capillary: 99 mg/dL (ref 70–99)

## 2021-01-26 LAB — CULTURE, BLOOD (ROUTINE X 2): Culture: NO GROWTH

## 2021-01-26 MED ORDER — METHOCARBAMOL 500 MG PO TABS
500.0000 mg | ORAL_TABLET | Freq: Three times a day (TID) | ORAL | Status: DC | PRN
  Administered 2021-01-26 – 2021-01-29 (×2): 500 mg via ORAL
  Filled 2021-01-26 (×2): qty 1

## 2021-01-26 MED ORDER — METHOCARBAMOL 750 MG PO TABS: 750.0000 mg | ORAL_TABLET | Freq: Once | ORAL | Status: DC

## 2021-01-26 MED ORDER — SENNOSIDES-DOCUSATE SODIUM 8.6-50 MG PO TABS
1.0000 | ORAL_TABLET | Freq: Two times a day (BID) | ORAL | Status: DC
  Administered 2021-01-26 (×2): 1 via ORAL
  Filled 2021-01-26 (×2): qty 1

## 2021-01-26 MED ORDER — HYDROCODONE-ACETAMINOPHEN 5-325 MG PO TABS
1.0000 | ORAL_TABLET | Freq: Four times a day (QID) | ORAL | Status: DC | PRN
Start: 2021-01-26 — End: 2021-01-30
  Administered 2021-01-26: 1 via ORAL
  Filled 2021-01-26: qty 1

## 2021-01-26 NOTE — Progress Notes (Signed)
[facility] night shift.  The patient complained to the staff about having back pain, but declined acetaminophen was offered to her and asked for something else.  I ordered methocarbamol 750 mg p.o. x1 dose, but also asked the nursing staff to tell the patient to take acetaminophen with it to have better results and have her pain evaluated by Dr. Jomarie Longs later today.  Sanda Klein, MD.

## 2021-01-26 NOTE — Progress Notes (Signed)
PROGRESS NOTE    Michelle Graves  HWE:[id number] DOB: 08-28-1951 DOA: 01/21/2021 PCP: [facility]  Brief Narrative: 70 year old female with history of hypertension, CVA in 2028, chronic anemia, rheumatoid arthritis, chronic kidney disease 3B was found unresponsive on the floor and was brought to the ED by EMS 3/28. -Daughter had talked to her at the previous day when she was in her usual state of health, on 3/28 she was found on the floor poorly responsive and brought to the ED. recently hospitalized 2/19-2/23 with acute metabolic encephalopathy) secondary to hypoglycemia, CVA and/or medication she was noted to have a left lateral cerebellar infarct on MRI then -This time in the ED she was afebrile, hypertensive, with fluctuating O2 sats, hypoglycemic, CT head and cervical spine were unremarkable, labs noted mild leukocytosis, abnormal urinalysis, she was agitated after she woke up and subsequently given Ativan and Versed, subsequently became more delirious and was admitted to the ICU on Precedex. 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, this was noted -CBGs are stable, D10 discontinued -Urine culture with staph epidermidis, off ceftriaxone since yesterday -Blood culture notes staph epidermis is in 1 out of 2 sets, significance is unknown, repeat blood cultures are negative -Seroquel held -Ambulate, PT OT eval completed, SNF recommended for rehab -Discharge planning  Sepsis, UTI -Treated with IV Rocephin x5 days, urine culture with staph epi -Staph epidermidis and hominis, likely contaminant and blood cultures -Repeat blood cultures are negative -Monitor off antibiotics,  now afebrile without  leukocytosis  Acute kidney injury Intermittent urinary retention -Baseline creatinine around 1.4-1.5, creatinine was 1.7 on admission -Stable now -Foley catheter discontinued yesterday, urinating without difficulty now  Lower back, tailbone discomfort -Check x-ray LS-spine, -Trial of Robaxin  Constipation Chronic colonic stricture -Continue laxatives daily, had a BM yesterday after enema  Hypertension -Continue Coreg and diltiazem, hold clonidine  Hypokalemia Hypomagnesemia -Repleted  History of CVA -Continue aspirin -PT eval  Chronic anemia -Hemoglobin slightly lower possibly from hemodilution, continue to trend  DVT prophylaxis: Lovenox Code Status: Full code Family Communication: No family at bedside, will update daughter Disposition Plan:  Status is: Inpatient  Remains inpatient appropriate because:Inpatient level of care appropriate due to severity of illness   Dispo: The patient is from: Home              Anticipated d/c is to: SNF, hopefully 48 hours              Patient currently is not medically stable to d/c.   Difficult to place patient No  Consultants:  Procedures:   Antimicrobials:    Subjective: -Feels better today, has some lower back, tailbone discomfort  Objective: Vitals:   01/25/21 2359 01/26/21 0326 01/26/21 0739 01/26/21 0953  BP: 109/69 122/72 134/76 133/80  Pulse: 81 78 70 78  Resp: 19 17 18    Temp: 98.4 F (36.9 C) 98.6 F (37 C) 98.2 F (36.8 C)   TempSrc: Axillary Axillary Oral   SpO2: 100% 100% 100%   Weight:      Height:        Intake/Output Summary (Last 24 hours) at 01/26/2021 1208 Last data filed at 01/26/2021 0500 Gross per 24 hour  Intake 360 ml  Output 700 ml  Net -340 ml  Filed Weights   01/21/21 0924 01/21/21 2200  Weight: 86.5 kg 86.7 kg    Examination:  General exam: Pleasant chronically ill female appears older than stated age, awake alert oriented to self, place and partly to time, some cognitive  deficits noted CVS: S1-S2, regular rate rhythm Lungs: Decreased breath sounds the bases otherwise clear Abdomen: Soft, nontender, bowel sounds present Extremities: No edema  Neuro: Moves all extremities, no localizing signs  Psychiatry: Poor insight and judgment    Data Reviewed:   CBC: Recent Labs  Lab 01/21/21 0948 01/21/21 1213 01/22/21 0440 01/23/21 1136 01/24/21 0408 01/25/21 0449 01/26/21 0246  WBC 12.2*  --  12.5* 12.6* 13.5* 9.6 8.4  NEUTROABS 9.6*  --   --   --   --   --   --   HGB 11.8*   < > 10.5* 11.5* 11.5* 9.8* 10.0*  HCT 36.5   < > 32.7* 34.3* 34.1* 29.0* 30.4*  MCV 94.1  --  95.3 94.0 93.2 92.9 94.1  PLT 233  --  193 219 188 187 211   < > = values in this interval not displayed.   Basic Metabolic Panel: Recent Labs  Lab 01/22/21 0440 01/23/21 1136 01/24/21 0408 01/25/21 0449 01/26/21 0246  NA 140 138 135 134* 134*  K 3.4* 3.6 3.4* 3.5 4.0  CL 110 106 105 104 104  CO2 24 21* 20* 22 20*  GLUCOSE 166* 166* 158* 121* 87  BUN 19 10 11 16 22   CREATININE 1.29* 1.16* 1.12* 1.42* 1.46*  CALCIUM 8.7* 9.2 9.1 8.5* 8.4*  MG  --   --  1.5* 2.2  --   PHOS  --   --   --  4.1  --    GFR: Estimated Creatinine Clearance: 41.7 mL/min (A) (by C-G formula based on SCr of 1.46 mg/dL (H)). Liver Function Tests: Recent Labs  Lab 01/21/21 0948 01/23/21 1136  AST 27 27  ALT 16 19  ALKPHOS 88 66  BILITOT 0.7 0.5  PROT 7.5 6.5  ALBUMIN 4.0 3.4*   Recent Labs  Lab 01/23/21 1136  LIPASE 24   Recent Labs  Lab 01/21/21 1204  AMMONIA 27   Coagulation Profile: Recent Labs  Lab 01/21/21 0948  INR 1.0   Cardiac Enzymes: Recent Labs  Lab 01/21/21 0948  CKTOTAL 281*   BNP (last 3 results) No results for input(s): PROBNP in the last 8760 hours. HbA1C: No results for input(s): HGBA1C in the last 72 hours. CBG: Recent Labs  Lab 01/25/21 2024 01/26/21 0021 01/26/21 0326 01/26/21 0734 01/26/21 1204  GLUCAP 111* 113* 99 104* 131*   Lipid  Profile: No results for input(s): CHOL, HDL, LDLCALC, TRIG, CHOLHDL, LDLDIRECT in the last 72 hours. Thyroid Function Tests: No results for input(s): TSH, T4TOTAL, FREET4, T3FREE, THYROIDAB in the last 72 hours. Anemia Panel: No results for input(s): VITAMINB12, FOLATE, FERRITIN, TIBC, IRON, RETICCTPCT in the last 72 hours. Urine analysis:    Component Value Date/Time   COLORURINE AMBER (A) 01/21/2021 0948   APPEARANCEUR CLOUDY (A) 01/21/2021 0948   LABSPEC 1.018 01/21/2021 0948   PHURINE 6.0 01/21/2021 0948   GLUCOSEU NEGATIVE 01/21/2021 0948   HGBUR NEGATIVE 01/21/2021 0948   BILIRUBINUR NEGATIVE 01/21/2021 0948   KETONESUR NEGATIVE 01/21/2021 0948   PROTEINUR 100 (A) 01/21/2021 0948   UROBILINOGEN 1.0 06/24/2010 1850   NITRITE POSITIVE (A) 01/21/2021 0948   LEUKOCYTESUR LARGE (A) 01/21/2021 0948   Sepsis Labs: @LABRCNTIP (procalcitonin:4,lacticidven:4)  ) Recent Results (from the  past 240 hour(s))  Blood Cultures (routine x 2)     Status: Abnormal   Collection Time: 01/21/21  9:48 AM   Specimen: BLOOD  Result Value Ref Range Status   Specimen Description BLOOD SITE NOT SPECIFIED  Final   Special Requests   Final    BOTTLES DRAWN AEROBIC AND ANAEROBIC Blood Culture adequate volume   Culture  Setup Time   Final    IN BOTH AEROBIC AND ANAEROBIC BOTTLES GRAM POSITIVE COCCI CRITICAL RESULT CALLED TO, READ BACK BY AND VERIFIED WITH: PHARMD GREG A. [id number] FCP    Culture (A)  Final    STAPHYLOCOCCUS HOMINIS STAPHYLOCOCCUS EPIDERMIDIS THE SIGNIFICANCE OF ISOLATING THIS ORGANISM FROM A SINGLE SET OF BLOOD CULTURES WHEN MULTIPLE SETS ARE DRAWN IS UNCERTAIN. PLEASE NOTIFY THE MICROBIOLOGY DEPARTMENT WITHIN ONE WEEK IF SPECIATION AND SENSITIVITIES ARE REQUIRED. Performed at [facility], [address] [place], [place] [address]    Report Status 01/24/2021 FINAL  Final  Urine culture     Status: Abnormal   Collection Time: 01/21/21  9:48 AM   Specimen: Urine, Random   Result Value Ref Range Status   Specimen Description URINE, RANDOM  Final   Special Requests   Final    NONE Performed at [facility], [address], [place], [place] [address]    Culture >=100,000 COLONIES/mL STAPHYLOCOCCUS EPIDERMIDIS (A)  Final   Report Status 01/23/2021 FINAL  Final   Organism ID, Bacteria STAPHYLOCOCCUS EPIDERMIDIS (A)  Final      Susceptibility   Staphylococcus epidermidis - MIC*    CIPROFLOXACIN 2 INTERMEDIATE Intermediate     GENTAMICIN 8 INTERMEDIATE Intermediate     NITROFURANTOIN <=16 SENSITIVE Sensitive     OXACILLIN >=4 RESISTANT Resistant     TETRACYCLINE <=1 SENSITIVE Sensitive     VANCOMYCIN 1 SENSITIVE Sensitive     TRIMETH/SULFA 20 SENSITIVE Sensitive     CLINDAMYCIN <=0.25 SENSITIVE Sensitive     RIFAMPIN <=0.5 SENSITIVE Sensitive     Inducible Clindamycin NEGATIVE Sensitive     * >=100,000 COLONIES/mL STAPHYLOCOCCUS EPIDERMIDIS  Blood Culture ID Panel (Reflexed)     Status: Abnormal   Collection Time: 01/21/21  9:48 AM  Result Value Ref Range Status   Enterococcus faecalis NOT DETECTED NOT DETECTED Final   Enterococcus Faecium NOT DETECTED NOT DETECTED Final   Listeria monocytogenes NOT DETECTED NOT DETECTED Final   Staphylococcus species DETECTED (A) NOT DETECTED Final    Comment: CRITICAL RESULT CALLED TO, READ BACK BY AND VERIFIED WITH: PHARMD GREG A. [id number] FCP    Staphylococcus aureus (BCID) NOT DETECTED NOT DETECTED Final   Staphylococcus epidermidis DETECTED (A) NOT DETECTED Final    Comment: Methicillin (oxacillin) resistant coagulase negative staphylococcus. Possible blood culture contaminant (unless isolated from more than one blood culture draw or clinical case suggests pathogenicity). No antibiotic treatment is indicated for blood  culture contaminants. CRITICAL RESULT CALLED TO, READ BACK BY AND VERIFIED WITH: PHARMD GREG A. [id number] FCP    Staphylococcus lugdunensis NOT DETECTED NOT DETECTED Final    Streptococcus species NOT DETECTED NOT DETECTED Final   Streptococcus agalactiae NOT DETECTED NOT DETECTED Final   Streptococcus pneumoniae NOT DETECTED NOT DETECTED Final   Streptococcus pyogenes NOT DETECTED NOT DETECTED Final   A.calcoaceticus-baumannii NOT DETECTED NOT DETECTED Final   Bacteroides fragilis NOT DETECTED NOT DETECTED Final   Enterobacterales NOT DETECTED NOT DETECTED Final   Enterobacter cloacae complex NOT DETECTED NOT DETECTED Final   Escherichia  coli NOT DETECTED NOT DETECTED Final   Klebsiella aerogenes NOT DETECTED NOT DETECTED Final   Klebsiella oxytoca NOT DETECTED NOT DETECTED Final   Klebsiella pneumoniae NOT DETECTED NOT DETECTED Final   Proteus species NOT DETECTED NOT DETECTED Final   Salmonella species NOT DETECTED NOT DETECTED Final   Serratia marcescens NOT DETECTED NOT DETECTED Final   Haemophilus influenzae NOT DETECTED NOT DETECTED Final   Neisseria meningitidis NOT DETECTED NOT DETECTED Final   Pseudomonas aeruginosa NOT DETECTED NOT DETECTED Final   Stenotrophomonas maltophilia NOT DETECTED NOT DETECTED Final   Candida albicans NOT DETECTED NOT DETECTED Final   Candida auris NOT DETECTED NOT DETECTED Final   Candida glabrata NOT DETECTED NOT DETECTED Final   Candida krusei NOT DETECTED NOT DETECTED Final   Candida parapsilosis NOT DETECTED NOT DETECTED Final   Candida tropicalis NOT DETECTED NOT DETECTED Final   Cryptococcus neoformans/gattii NOT DETECTED NOT DETECTED Final   Methicillin resistance mecA/C DETECTED (A) NOT DETECTED Final    Comment: CRITICAL RESULT CALLED TO, READ BACK BY AND VERIFIED WITH: PHARMD GREG A. [id number] FCP Performed at [facility], [address] [place], [place] [address]   Blood Cultures (routine x 2)     Status: None   Collection Time: 01/21/21  9:53 AM   Specimen: BLOOD RIGHT HAND  Result Value Ref Range Status   Specimen Description BLOOD RIGHT HAND  Final   Special Requests   Final    BOTTLES  DRAWN AEROBIC ONLY Blood Culture results may not be optimal due to an inadequate volume of blood received in culture bottles   Culture   Final    NO GROWTH 5 DAYS Performed at [facility], [address] [place], [place] [address]    Report Status 01/26/2021 FINAL  Final  MRSA PCR Screening     Status: None   Collection Time: 01/21/21 10:59 PM   Specimen: Nasal Mucosa; Nasopharyngeal  Result Value Ref Range Status   MRSA by PCR NEGATIVE NEGATIVE Final    Comment:        The GeneXpert MRSA Assay (FDA approved for NASAL specimens only), is one component of a comprehensive MRSA colonization surveillance program. It is not intended to diagnose MRSA infection nor to guide or monitor treatment for MRSA infections. Performed at [facility], [address], [place], [place] [address]   SARS CORONAVIRUS 2 (TAT 6-24 HRS) Nasopharyngeal Nasopharyngeal Swab     Status: None   Collection Time: 01/21/21 11:14 PM   Specimen: Nasopharyngeal Swab  Result Value Ref Range Status   SARS Coronavirus 2 NEGATIVE NEGATIVE Final    Comment: (NOTE) SARS-CoV-2 target nucleic acids are NOT DETECTED.  The SARS-CoV-2 RNA is generally detectable in upper and lower respiratory specimens during the acute phase of infection. Negative results do not preclude SARS-CoV-2 infection, do not rule out co-infections with other pathogens, and should not be used as the sole basis for treatment or other patient management decisions. Negative results must be combined with clinical observations, patient history, and epidemiological information. The expected result is Negative.  Fact Sheet for Patients: HairSlick.no  Fact Sheet for Healthcare Providers: quierodirigir.com  This test is not yet approved or cleared by the [place] FDA and  has been authorized for detection and/or diagnosis of SARS-CoV-2 by FDA under an Emergency Use Authorization  (EUA). This EUA will remain  in effect (meaning this test can be used) for the duration of the COVID-19 declaration  under Se ction 564(b)(1) of the Act, 21 U.S.C. section 360bbb-3(b)(1), unless the authorization is terminated or revoked sooner.  Performed at [facility], [address], [place], [place] [address]   Culture, blood (routine x 2)     Status: None (Preliminary result)   Collection Time: 01/22/21 11:14 AM   Specimen: BLOOD LEFT HAND  Result Value Ref Range Status   Specimen Description BLOOD LEFT HAND  Final   Special Requests   Final    BOTTLES DRAWN AEROBIC AND ANAEROBIC Blood Culture results may not be optimal due to an inadequate volume of blood received in culture bottles   Culture   Final    NO GROWTH 4 DAYS Performed at [facility], [address] [place], [place] [address]    Report Status PENDING  Incomplete  Culture, blood (routine x 2)     Status: None (Preliminary result)   Collection Time: 01/22/21 11:16 AM   Specimen: BLOOD LEFT ARM  Result Value Ref Range Status   Specimen Description BLOOD LEFT ARM  Final   Special Requests   Final    BOTTLES DRAWN AEROBIC AND ANAEROBIC Blood Culture results may not be optimal due to an inadequate volume of blood received in culture bottles   Culture   Final    NO GROWTH 4 DAYS Performed at [facility], [address] [place], [place] [address]    Report Status PENDING  Incomplete         Radiology Studies: No results found.  Scheduled Meds: . carvedilol  25 mg Oral BID WC  . diltiazem  180 mg Oral BID  . enoxaparin (LOVENOX) injection  40 mg Subcutaneous Q24H  . feeding supplement  1 Container Oral TID BM  . neomycin-bacitracin-polymyxin   Topical BID  . polyethylene glycol  17 g Oral Daily  . potassium chloride  40 mEq Oral Once  . sodium chloride flush  3 mL Intravenous Q12H   Continuous Infusions:    LOS: 5 days    Time spent:  Zannie Cove, MD [facility]  01/26/2021, 12:08 PM

## 2021-01-27 LAB — BASIC METABOLIC PANEL
Anion gap: 6 (ref 5–15)
BUN: 23 mg/dL (ref 8–23)
CO2: 25 mmol/L (ref 22–32)
Calcium: 8.9 mg/dL (ref 8.9–10.3)
Chloride: 106 mmol/L (ref 98–111)
Creatinine, Ser: 1.46 mg/dL — ABNORMAL HIGH (ref 0.44–1.00)
GFR, Estimated: 39 mL/min — ABNORMAL LOW (ref >60)
Glucose, Bld: 126 mg/dL — ABNORMAL HIGH (ref 70–99)
Potassium: 3.7 mmol/L (ref 3.5–5.1)
Sodium: 137 mmol/L (ref 135–145)

## 2021-01-27 LAB — CULTURE, BLOOD (ROUTINE X 2)
Culture: NO GROWTH
Culture: NO GROWTH

## 2021-01-27 LAB — GLUCOSE, CAPILLARY
Glucose-Capillary: 110 mg/dL — ABNORMAL HIGH (ref 70–99)
Glucose-Capillary: 123 mg/dL — ABNORMAL HIGH (ref 70–99)
Glucose-Capillary: 124 mg/dL — ABNORMAL HIGH (ref 70–99)
Glucose-Capillary: 126 mg/dL — ABNORMAL HIGH (ref 70–99)
Glucose-Capillary: 137 mg/dL — ABNORMAL HIGH (ref 70–99)
Glucose-Capillary: 147 mg/dL — ABNORMAL HIGH (ref 70–99)

## 2021-01-27 LAB — CBC
HCT: 30.3 % — ABNORMAL LOW (ref 36.0–46.0)
Hemoglobin: 10 g/dL — ABNORMAL LOW (ref 12.0–15.0)
MCH: 31.2 pg (ref 26.0–34.0)
MCHC: 33 g/dL (ref 30.0–36.0)
MCV: 94.4 fL (ref 80.0–100.0)
Platelets: 229 10*3/uL (ref 150–400)
RBC: 3.21 MIL/uL — ABNORMAL LOW (ref 3.87–5.11)
RDW: 14.1 % (ref 11.5–15.5)
WBC: 7.3 10*3/uL (ref 4.0–10.5)
nRBC: 0 % (ref 0.0–0.2)

## 2021-01-27 NOTE — Evaluation (Addendum)
Occupational Therapy Evaluation Patient Details Name: Michelle Graves MRN: [id number] DOB: [birth year] Today's Date: 01/27/2021    History of Present Illness 70 y.o. female presenting to ED after an unwitnessed fall at home. Patient admitted with sepsis 2/2 UTI, acute metabolic encephalopathy, and hypoglycemia. Patient with several hospital admissions with most recent 2/19-23 2/2 multifactorial acute metabolic encephalopathy. PMHx significant for HTN, CVA in 2020, RA, and CKD IIIb.   Clinical Impression   PTA patient was living alone in a 5th floor apartment with elevator access and was grossly Mod I with ADLs including bathing/dressing/toileting with use of RW. Per chart review and patient report, daughter was assisting with IADLs including meal prep, med management and transportation. Patient currently functioning below baseline demonstrating observed ADLs including LB dressing and ADL transfers with Min to Mod A and use of RW. Patient also limited by deficits listed below including generalized weakness and decreased cognition 2/2 diagnosis above and would benefit from continued acute OT services in prep for safe d/c to next level of care.    Follow Up Recommendations  SNF;Supervision/Assistance - 24 hour    Equipment Recommendations  None recommended by OT (Patient has necessary DME.)    Recommendations for Other Services       Precautions / Restrictions Precautions Precautions: Fall Precaution Comments: Incontinent x2 Restrictions Weight Bearing Restrictions: No      Mobility Bed Mobility Overal bed mobility: Needs Assistance Bed Mobility: Supine to Sit     Supine to sit: Min assist     General bed mobility comments: Patient able to advance BLE toward EOB. Min A to bring trunk upright with HOB elevated and HHA +1.    Transfers Overall transfer level: Needs assistance Equipment used: Rolling walker (2 wheeled) Transfers: Sit to/from Stand Sit to Stand: Min assist          General transfer comment: Light Min A for sit to stand from slightly elevated EOB.    Balance Overall balance assessment: Needs assistance Sitting-balance support: Single extremity supported;No upper extremity supported;Feet supported Sitting balance-[name]: Fair Sitting balance - Comments: Mild posterior LOB seated EOB.   Standing balance support: Bilateral upper extremity supported;During functional activity Standing balance-[name]: Poor Standing balance comment: Reliant on BUE on RW with mobility.                           ADL either performed or assessed with clinical judgement   ADL Overall ADL's : Needs assistance/impaired                 Upper Body Dressing : Minimal assistance;Sitting Upper Body Dressing Details (indicate cue type and reason): Donned posterior hospital gown seated EOB. Lower Body Dressing: Moderate assistance;Sit to/from stand Lower Body Dressing Details (indicate cue type and reason): Mod A to don footwear seated EOB. Toilet Transfer: Immunologist Details (indicate cue type and reason): To BSC with use of RW and Min gaurd. Good recall for hand placement.         Functional mobility during ADLs: Min guard;Rolling walker General ADL Comments: Patient limited by decreased cognition, decreased sitting/standing balance, and generalized weakness.     Vision         Perception     Praxis      Pertinent Vitals/Pain Pain Assessment: No/denies pain     Hand Dominance Left   Extremity/Trunk Assessment Upper Extremity Assessment Upper Extremity Assessment: Generalized weakness   Lower Extremity Assessment Lower  Extremity Assessment: Generalized weakness       Communication Communication Communication: No difficulties   Cognition Arousal/Alertness: Awake/alert Behavior During Therapy: WFL for tasks assessed/performed Overall Cognitive Status: No family/caregiver present to determine baseline  cognitive functioning Area of Impairment: Orientation;Attention;Memory;Following commands;Safety/judgement;Awareness;Problem solving                 Orientation Level: Disoriented to;Place;Situation Current Attention Level: Sustained Memory: Decreased short-term memory Following Commands: Follows one step commands inconsistently;Follows one step commands with increased time Safety/Judgement: Decreased awareness of safety;Decreased awareness of deficits Awareness: Intellectual Problem Solving: Slow processing;Requires verbal cues General Comments: Patient A&Ox4 this date. Noted increased time needed to process verbal information. Patient reports PLOF and home set-up with 90-95% accuracy compared to info obtained from previous admission. Mild confusion noted.   General Comments  Patient incontinent of bowel with mobility.    Exercises     Shoulder Instructions      Home Living Family/patient expects to be discharged to:: Private residence Living Arrangements: Alone Available Help at Discharge: Family;Available PRN/intermittently Type of Home: Apartment Home Access: Elevator     Home Layout: One level     Bathroom Shower/Tub: Chief Strategy Officer: Handicapped height Bathroom Accessibility: Yes   Home Equipment: Environmental consultant - 2 wheels;Walker - 4 wheels;Cane - quad;Shower seat;Bedside commode;Wheelchair - manual;Grab bars - toilet;Grab bars - tub/shower;Toilet riser;Other (comment)   Additional Comments: Patient reports home set-up with ~90% accuracy compared to information obtained from med chart from previous admission.      Prior Functioning/Environment Level of Independence: Independent with assistive device(s);Needs assistance  Gait / Transfers Assistance Needed: rollator in community, RW vs. SPC in house ADL's / Homemaking Assistance Needed: Assist with IADLs including meal prep, med management and driving. Patient able to dress/bathe without assist at  baseline.   Comments: Patient reports PLOF with 95% accuracy compared to information obtained from chart review.        OT Problem List: Decreased strength;Decreased activity tolerance;Impaired balance (sitting and/or standing);Decreased cognition;Decreased safety awareness;Decreased knowledge of use of DME or AE      OT Treatment/Interventions: Self-care/ADL training;Therapeutic exercise;Energy conservation;DME and/or AE instruction;Therapeutic activities;Cognitive remediation/compensation;Patient/family education;Balance training    OT Goals(Current goals can be found in the care plan section) Acute Rehab OT Goals Patient Stated Goal: To return home. OT Goal Formulation: With patient Time For Goal Achievement: 02/10/21 Potential to Achieve Goals: Good  OT Frequency: Min 2X/week   Barriers to D/C: Decreased caregiver support  Lives alone.       Co-evaluation              AM-PAC OT "6 Clicks" Daily Activity     Outcome Measure Help from another person eating meals?: None Help from another person taking care of personal grooming?: A Little Help from another person toileting, which includes using toliet, bedpan, or urinal?: A Lot Help from another person bathing (including washing, rinsing, drying)?: A Lot Help from another person to put on and taking off regular upper body clothing?: A Little Help from another person to put on and taking off regular lower body clothing?: A Lot 6 Click Score: 16   End of Session Equipment Utilized During Treatment: Gait belt;Rolling walker Nurse Communication: Mobility status  Activity Tolerance: Patient tolerated treatment well Patient left: Other (comment);with nursing/sitter in room (Seated on BSC. NT present at bedside.)  OT Visit Diagnosis: Unsteadiness on feet (R26.81);Muscle weakness (generalized) (M62.81);History of falling (Z91.81)  Time: 3875-6433 OT Time Calculation (min): 27 min Charges:  OT General  Charges $OT Visit: 1 Visit OT Evaluation $OT Eval Moderate Complexity: 1 Mod OT Treatments $Self Care/Home Management : 8-22 mins  [place] Guard H. OTR/L Supplemental OT, Department of rehab services [phone]  Carmilla [place] R H. 01/27/2021, 12:11 PM

## 2021-01-27 NOTE — TOC Progression Note (Signed)
Transition of Care [facility]) - Progression Note    Patient Details  Name: Michelle Graves MRN: [id number] Date of Birth: [birth year]  Transition of Care [facility]) CM/SW Contact  Carley Hammed, [place] Phone Number: 01/27/2021, 1:20 PM  Clinical Narrative:    CSW was requested to let pt's dtr know that pt was into her co pay days. CSW called dtr, and dtr stated they did not have the money for that. Weekday CSW had requested she start a Medicaid application, questions were answered about the process and she noted she would do that first thing in the morning. SW will continue to follow for DC planning.  Expected Discharge Plan: Skilled Nursing Facility Barriers to Discharge: Continued Medical Work up,Insurance Authorization  Expected Discharge Plan and Services Expected Discharge Plan: Skilled Nursing Facility     Post Acute Care Choice: Skilled Nursing Facility Living arrangements for the past 2 months: Apartment                                       Social Determinants of Health (SDOH) Interventions    Readmission Risk Interventions Readmission Risk Prevention Plan 05/10/2020 05/10/2020 10/28/2019  Transportation Screening - Complete Complete  PCP or Specialist Appt within 3-5 Days - - -  Not Complete comments - - -  HRI or Home Care Consult - - -  HRI or Home Care Consult comments - - -  Social Work Consult for Recovery Care Planning/Counseling - - -  Palliative Care Screening - - -  Medication Review [facility]) - Referral to Pharmacy Referral to Pharmacy  PCP or Specialist appointment within 3-5 days of discharge Complete Complete Not Complete  PCP/Specialist Appt Not Complete comments - - due to holiday, pt to make appointment  HRI or Home Care Consult - Complete Complete  SW Recovery Care/Counseling Consult - - Complete  Palliative Care Screening - Not Applicable Not Applicable  Skilled Nursing Facility - Patient Refused Complete  Some recent data might be hidden

## 2021-01-27 NOTE — Progress Notes (Signed)
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 -Daughter had talked to her at the previous day when she was in her usual state of health, on 3/28 she was found on the floor poorly responsive and brought to the ED. recently hospitalized 2/19-2/23 with acute metabolic encephalopathy) secondary to hypoglycemia, CVA and/or medication she was noted to have a left lateral cerebellar infarct on MRI then -This time in the ED she was afebrile, hypertensive, with fluctuating O2 sats, hypoglycemic, CT head and cervical spine were unremarkable, labs noted mild leukocytosis, abnormal urinalysis, she was agitated after she woke up and subsequently given Ativan and Versed, subsequently became more delirious and was admitted to the ICU on Precedex. -Did not require mechanical ventilation, transferred from PCCM to [facility] service today 4/1   Assessment & Plan:     Acute metabolic encephalopathy -Multifactorial, hypoglycemia, UTI, polypharmacy -May also have a component of cognitive deficits, memory loss at baseline, possible vascular dementia, this was noted during last hospitalization and rehab stay -CBGs are stable, D10 discontinued -Urine culture with staph epidermidis, off ceftriaxone for now -Blood culture notes staph epidermis is in 1 out of 2 sets, significance is unknown, repeat blood cultures are negative -Seroquel held -Ambulate, PT OT eval completed, SNF recommended for rehab -Discharge planning  Sepsis, UTI -Treated with IV Rocephin x5 days, urine culture with staph epi -Staph epidermidis and hominis, likely contaminant and blood cultures -Repeat blood cultures are negative -Monitor off antibiotics,  remains afebrile without leukocytosis  Acute kidney injury Intermittent urinary retention -Baseline creatinine around 1.4-1.5, creatinine was 1.7 on admission -Stable now -Foley catheter discontinued 4/1, urinating without difficulty now  Lower back, tailbone discomfort L5 compression fracture, this was seen on imaging 2 months ago as well -Supportive care, physical therapy -Continue Robaxin as tolerated  Constipation Chronic colonic stricture -Multiple BMs overnight done today -Hold laxatives today, restart Senokot tomorrow  Hypertension -Stable now, continue Coreg and diltiazem, clonidine held  Hypokalemia Hypomagnesemia -Repleted  History of CVA -Continue aspirin -PT eval  Chronic anemia -Hemoglobin slightly lower possibly from hemodilution, continue to trend  DVT prophylaxis: Lovenox Code Status: Full code Family Communication: No family at bedside, will update daughter Disposition Plan:  Status is: Inpatient  Remains inpatient appropriate because:Inpatient level of care appropriate due to severity of illness   Dispo: The patient is from: Home              Anticipated d/c is to: SNF, 1 to 2 days              Patient currently is medically stable to d/c.   Difficult to place patient No  Consultants:   Procedures:   Antimicrobials:    Subjective: -Denies any low back and tailbone pain today, reports having multiple loose, soft stools after laxatives yesterday  Objective: Vitals:   01/26/21 2031 01/27/21 0031 01/27/21 0423 01/27/21 0805  BP: 106/75 109/80 (!) 117/93 (!) 148/87  Pulse: 70 75 73 (!) 105  Resp: 17 15 15 18   Temp: 98.1 F (36.7 C) 99.4 F (37.4 C) 98 F (36.7 C) 98 F (36.7 C)  TempSrc: Oral Oral Oral Axillary  SpO2: 96% 98% 97% 100%  Weight:      Height:  Intake/Output Summary (Last 24 hours) at 01/27/2021 1103 Last data filed at 01/27/2021 1962 Gross per 24 hour  Intake 120 ml  Output 252 ml  Net -132 ml   Filed Weights    01/21/21 0924 01/21/21 2200  Weight: 86.5 kg 86.7 kg    Examination:  General exam: Pleasant chronically ill female appears older than stated age, awake alert oriented to self, place and partly to time, some cognitive deficits noted CVS: S1-S2, regular rate rhythm Lungs: Decreased breath sounds the bases otherwise clear Abdomen: Soft, nontender, bowel sounds present Extremities: No edema  Neuro: Moves all extremities, no localizing signs  Psychiatry: Poor insight and judgment    Data Reviewed:   CBC: Recent Labs  Lab 01/21/21 0948 01/21/21 1213 01/23/21 1136 01/24/21 0408 01/25/21 0449 01/26/21 0246 01/27/21 0140  WBC 12.2*   < > 12.6* 13.5* 9.6 8.4 7.3  NEUTROABS 9.6*  --   --   --   --   --   --   HGB 11.8*   < > 11.5* 11.5* 9.8* 10.0* 10.0*  HCT 36.5   < > 34.3* 34.1* 29.0* 30.4* 30.3*  MCV 94.1   < > 94.0 93.2 92.9 94.1 94.4  PLT 233   < > 219 188 187 211 229   < > = values in this interval not displayed.   Basic Metabolic Panel: Recent Labs  Lab 01/23/21 1136 01/24/21 0408 01/25/21 0449 01/26/21 0246 01/27/21 0140  NA 138 135 134* 134* 137  K 3.6 3.4* 3.5 4.0 3.7  CL 106 105 104 104 106  CO2 21* 20* 22 20* 25  GLUCOSE 166* 158* 121* 87 126*  BUN 10 11 16 22 23   CREATININE 1.16* 1.12* 1.42* 1.46* 1.46*  CALCIUM 9.2 9.1 8.5* 8.4* 8.9  MG  --  1.5* 2.2  --   --   PHOS  --   --  4.1  --   --    GFR: Estimated Creatinine Clearance: 41.7 mL/min (A) (by C-G formula based on SCr of 1.46 mg/dL (H)). Liver Function Tests: Recent Labs  Lab 01/21/21 0948 01/23/21 1136  AST 27 27  ALT 16 19  ALKPHOS 88 66  BILITOT 0.7 0.5  PROT 7.5 6.5  ALBUMIN 4.0 3.4*   Recent Labs  Lab 01/23/21 1136  LIPASE 24   Recent Labs  Lab 01/21/21 1204  AMMONIA 27   Coagulation Profile: Recent Labs  Lab 01/21/21 0948  INR 1.0   Cardiac Enzymes: Recent Labs  Lab 01/21/21 0948  CKTOTAL 281*   BNP (last 3 results) No results for input(s): PROBNP in the last  8760 hours. HbA1C: Recent Labs    01/26/21 0246  HGBA1C 6.4*   CBG: Recent Labs  Lab 01/26/21 1507 01/26/21 2049 01/27/21 0031 01/27/21 0423 01/27/21 0732  GLUCAP 145* 145* 137* 124* 126*   Lipid Profile: No results for input(s): CHOL, HDL, LDLCALC, TRIG, CHOLHDL, LDLDIRECT in the last 72 hours. Thyroid Function Tests: No results for input(s): TSH, T4TOTAL, FREET4, T3FREE, THYROIDAB in the last 72 hours. Anemia Panel: No results for input(s): VITAMINB12, FOLATE, FERRITIN, TIBC, IRON, RETICCTPCT in the last 72 hours. Urine analysis:    Component Value Date/Time   COLORURINE AMBER (A) 01/21/2021 0948   APPEARANCEUR CLOUDY (A) 01/21/2021 0948   LABSPEC 1.018 01/21/2021 0948   PHURINE 6.0 01/21/2021 0948   GLUCOSEU NEGATIVE 01/21/2021 0948   HGBUR NEGATIVE 01/21/2021 0948   BILIRUBINUR NEGATIVE 01/21/2021 0948   KETONESUR NEGATIVE 01/21/2021 01/23/2021  PROTEINUR 100 (A) 01/21/2021 0948   UROBILINOGEN 1.0 06/24/2010 1850   NITRITE POSITIVE (A) 01/21/2021 0948   LEUKOCYTESUR LARGE (A) 01/21/2021 0948   Sepsis Labs: @LABRCNTIP (procalcitonin:4,lacticidven:4)  ) Recent Results (from the past 240 hour(s))  Blood Cultures (routine x 2)     Status: Abnormal   Collection Time: 01/21/21  9:48 AM   Specimen: BLOOD  Result Value Ref Range Status   Specimen Description BLOOD SITE NOT SPECIFIED  Final   Special Requests   Final    BOTTLES DRAWN AEROBIC AND ANAEROBIC Blood Culture adequate volume   Culture  Setup Time   Final    IN BOTH AEROBIC AND ANAEROBIC BOTTLES GRAM POSITIVE COCCI CRITICAL RESULT CALLED TO, READ BACK BY AND VERIFIED WITH: PHARMD GREG A. [id number] FCP    Culture (A)  Final    STAPHYLOCOCCUS HOMINIS STAPHYLOCOCCUS EPIDERMIDIS THE SIGNIFICANCE OF ISOLATING THIS ORGANISM FROM A SINGLE SET OF BLOOD CULTURES WHEN MULTIPLE SETS ARE DRAWN IS UNCERTAIN. PLEASE NOTIFY THE MICROBIOLOGY DEPARTMENT WITHIN ONE WEEK IF SPECIATION AND SENSITIVITIES ARE REQUIRED. Performed  at [facility], [address] [place], [place] [address]    Report Status 01/24/2021 FINAL  Final  Urine culture     Status: Abnormal   Collection Time: 01/21/21  9:48 AM   Specimen: Urine, Random  Result Value Ref Range Status   Specimen Description URINE, RANDOM  Final   Special Requests   Final    NONE Performed at [facility], [address], [place], [place] [address]    Culture >=100,000 COLONIES/mL STAPHYLOCOCCUS EPIDERMIDIS (A)  Final   Report Status 01/23/2021 FINAL  Final   Organism ID, Bacteria STAPHYLOCOCCUS EPIDERMIDIS (A)  Final      Susceptibility   Staphylococcus epidermidis - MIC*    CIPROFLOXACIN 2 INTERMEDIATE Intermediate     GENTAMICIN 8 INTERMEDIATE Intermediate     NITROFURANTOIN <=16 SENSITIVE Sensitive     OXACILLIN >=4 RESISTANT Resistant     TETRACYCLINE <=1 SENSITIVE Sensitive     VANCOMYCIN 1 SENSITIVE Sensitive     TRIMETH/SULFA 20 SENSITIVE Sensitive     CLINDAMYCIN <=0.25 SENSITIVE Sensitive     RIFAMPIN <=0.5 SENSITIVE Sensitive     Inducible Clindamycin NEGATIVE Sensitive     * >=100,000 COLONIES/mL STAPHYLOCOCCUS EPIDERMIDIS  Blood Culture ID Panel (Reflexed)     Status: Abnormal   Collection Time: 01/21/21  9:48 AM  Result Value Ref Range Status   Enterococcus faecalis NOT DETECTED NOT DETECTED Final   Enterococcus Faecium NOT DETECTED NOT DETECTED Final   Listeria monocytogenes NOT DETECTED NOT DETECTED Final   Staphylococcus species DETECTED (A) NOT DETECTED Final    Comment: CRITICAL RESULT CALLED TO, READ BACK BY AND VERIFIED WITH: PHARMD GREG A. [id number] FCP    Staphylococcus aureus (BCID) NOT DETECTED NOT DETECTED Final   Staphylococcus epidermidis DETECTED (A) NOT DETECTED Final    Comment: Methicillin (oxacillin) resistant coagulase negative staphylococcus. Possible blood culture contaminant (unless isolated from more than one blood culture draw or clinical case suggests pathogenicity). No antibiotic treatment is  indicated for blood  culture contaminants. CRITICAL RESULT CALLED TO, READ BACK BY AND VERIFIED WITH: PHARMD GREG A. [id number] FCP    Staphylococcus lugdunensis NOT DETECTED NOT DETECTED Final   Streptococcus species NOT DETECTED NOT DETECTED Final   Streptococcus agalactiae NOT DETECTED NOT DETECTED Final   Streptococcus pneumoniae NOT DETECTED NOT DETECTED Final   Streptococcus pyogenes NOT DETECTED NOT DETECTED Final  A.calcoaceticus-baumannii NOT DETECTED NOT DETECTED Final   Bacteroides fragilis NOT DETECTED NOT DETECTED Final   Enterobacterales NOT DETECTED NOT DETECTED Final   Enterobacter cloacae complex NOT DETECTED NOT DETECTED Final   Escherichia coli NOT DETECTED NOT DETECTED Final   Klebsiella aerogenes NOT DETECTED NOT DETECTED Final   Klebsiella oxytoca NOT DETECTED NOT DETECTED Final   Klebsiella pneumoniae NOT DETECTED NOT DETECTED Final   Proteus species NOT DETECTED NOT DETECTED Final   Salmonella species NOT DETECTED NOT DETECTED Final   Serratia marcescens NOT DETECTED NOT DETECTED Final   Haemophilus influenzae NOT DETECTED NOT DETECTED Final   Neisseria meningitidis NOT DETECTED NOT DETECTED Final   Pseudomonas aeruginosa NOT DETECTED NOT DETECTED Final   Stenotrophomonas maltophilia NOT DETECTED NOT DETECTED Final   Candida albicans NOT DETECTED NOT DETECTED Final   Candida auris NOT DETECTED NOT DETECTED Final   Candida glabrata NOT DETECTED NOT DETECTED Final   Candida krusei NOT DETECTED NOT DETECTED Final   Candida parapsilosis NOT DETECTED NOT DETECTED Final   Candida tropicalis NOT DETECTED NOT DETECTED Final   Cryptococcus neoformans/gattii NOT DETECTED NOT DETECTED Final   Methicillin resistance mecA/C DETECTED (A) NOT DETECTED Final    Comment: CRITICAL RESULT CALLED TO, READ BACK BY AND VERIFIED WITH: PHARMD GREG A. [id number] FCP Performed at [facility], [address], [place], [place] [address]   Blood Cultures (routine x 2)      Status: None   Collection Time: 01/21/21  9:53 AM   Specimen: BLOOD RIGHT HAND  Result Value Ref Range Status   Specimen Description BLOOD RIGHT HAND  Final   Special Requests   Final    BOTTLES DRAWN AEROBIC ONLY Blood Culture results may not be optimal due to an inadequate volume of blood received in culture bottles   Culture   Final    NO GROWTH 5 DAYS Performed at [facility], [address], [place], [place] [address]    Report Status 01/26/2021 FINAL  Final  MRSA PCR Screening     Status: None   Collection Time: 01/21/21 10:59 PM   Specimen: Nasal Mucosa; Nasopharyngeal  Result Value Ref Range Status   MRSA by PCR NEGATIVE NEGATIVE Final    Comment:        The GeneXpert MRSA Assay (FDA approved for NASAL specimens only), is one component of a comprehensive MRSA colonization surveillance program. It is not intended to diagnose MRSA infection nor to guide or monitor treatment for MRSA infections. Performed at [facility], [address], [place], [place] [address]   SARS CORONAVIRUS 2 (TAT 6-24 HRS) Nasopharyngeal Nasopharyngeal Swab     Status: None   Collection Time: 01/21/21 11:14 PM   Specimen: Nasopharyngeal Swab  Result Value Ref Range Status   SARS Coronavirus 2 NEGATIVE NEGATIVE Final    Comment: (NOTE) SARS-CoV-2 target nucleic acids are NOT DETECTED.  The SARS-CoV-2 RNA is generally detectable in upper and lower respiratory specimens during the acute phase of infection. Negative results do not preclude SARS-CoV-2 infection, do not rule out co-infections with other pathogens, and should not be used as the sole basis for treatment or other patient management decisions. Negative results must be combined with clinical observations, patient history, and epidemiological information. The expected result is Negative.  Fact Sheet for Patients: HairSlick.no  Fact Sheet for Healthcare  Providers: quierodirigir.com  This test is not yet approved or cleared by the [place] FDA and  has  been authorized for detection and/or diagnosis of SARS-CoV-2 by FDA under an Emergency Use Authorization (EUA). This EUA will remain  in effect (meaning this test can be used) for the duration of the COVID-19 declaration under Se ction 564(b)(1) of the Act, 21 U.S.C. section 360bbb-3(b)(1), unless the authorization is terminated or revoked sooner.  Performed at [facility], [address] [place], [place] [address]   Culture, blood (routine x 2)     Status: None (Preliminary result)   Collection Time: 01/22/21 11:14 AM   Specimen: BLOOD LEFT HAND  Result Value Ref Range Status   Specimen Description BLOOD LEFT HAND  Final   Special Requests   Final    BOTTLES DRAWN AEROBIC AND ANAEROBIC Blood Culture results may not be optimal due to an inadequate volume of blood received in culture bottles   Culture   Final    NO GROWTH 4 DAYS Performed at [facility], [address] [place], [place] [address]    Report Status PENDING  Incomplete  Culture, blood (routine x 2)     Status: None (Preliminary result)   Collection Time: 01/22/21 11:16 AM   Specimen: BLOOD LEFT ARM  Result Value Ref Range Status   Specimen Description BLOOD LEFT ARM  Final   Special Requests   Final    BOTTLES DRAWN AEROBIC AND ANAEROBIC Blood Culture results may not be optimal due to an inadequate volume of blood received in culture bottles   Culture   Final    NO GROWTH 4 DAYS Performed at [facility], [address], [place], [place] [address]    Report Status PENDING  Incomplete         Radiology Studies: DG Lumbar Spine 2-3 Views  Result Date: 01/26/2021 CLINICAL DATA:  71 year old female with persistent back pain since falling 1 week previously EXAM: LUMBAR SPINE - 2-3 VIEW COMPARISON:  Prior CT abdomen/pelvis 12/15/2020 FINDINGS: Compression fracture of  the L5 vertebral body appears grossly similar compared to prior CT imaging from February 1922. No additional acute fracture or malalignment is identified. Bilateral facet arthropathy present at L3-L4, L4-L5 and L5-S1. No lytic or blastic osseous lesion. The visualized bowel gas pattern is not obstructed. IMPRESSION: 1. Stable compression fracture of L5 compared to prior CT imaging from 12/15/2020. 2. No convincing evidence of new fracture or malalignment. 3. Lower lumbar facet arthropathy. Electronically Signed   By: Malachy Moan M.D.   On: 01/26/2021 13:07    Scheduled Meds: . carvedilol  25 mg Oral BID WC  . diltiazem  180 mg Oral BID  . enoxaparin (LOVENOX) injection  40 mg Subcutaneous Q24H  . feeding supplement  1 Container Oral TID BM  . neomycin-bacitracin-polymyxin   Topical BID  . potassium chloride  40 mEq Oral Once  . sodium chloride flush  3 mL Intravenous Q12H   Continuous Infusions:    LOS: 6 days    Time spent:  Zannie Cove, MD [facility]  01/27/2021, 11:03 AM

## 2021-01-28 ENCOUNTER — Ambulatory Visit: Payer: Medicare Other | Admitting: Endocrinology

## 2021-01-28 LAB — CBC
HCT: 30.3 % — ABNORMAL LOW (ref 36.0–46.0)
Hemoglobin: 9.8 g/dL — ABNORMAL LOW (ref 12.0–15.0)
MCH: 30.4 pg (ref 26.0–34.0)
MCHC: 32.3 g/dL (ref 30.0–36.0)
MCV: 94.1 fL (ref 80.0–100.0)
Platelets: 226 10*3/uL (ref 150–400)
RBC: 3.22 MIL/uL — ABNORMAL LOW (ref 3.87–5.11)
RDW: 14.3 % (ref 11.5–15.5)
WBC: 7.4 10*3/uL (ref 4.0–10.5)
nRBC: 0 % (ref 0.0–0.2)

## 2021-01-28 LAB — BASIC METABOLIC PANEL
Anion gap: 8 (ref 5–15)
BUN: 21 mg/dL (ref 8–23)
CO2: 23 mmol/L (ref 22–32)
Calcium: 9 mg/dL (ref 8.9–10.3)
Chloride: 108 mmol/L (ref 98–111)
Creatinine, Ser: 1.17 mg/dL — ABNORMAL HIGH (ref 0.44–1.00)
GFR, Estimated: 51 mL/min — ABNORMAL LOW (ref >60)
Glucose, Bld: 118 mg/dL — ABNORMAL HIGH (ref 70–99)
Potassium: 3.9 mmol/L (ref 3.5–5.1)
Sodium: 139 mmol/L (ref 135–145)

## 2021-01-28 LAB — GLUCOSE, CAPILLARY
Glucose-Capillary: 112 mg/dL — ABNORMAL HIGH (ref 70–99)
Glucose-Capillary: 125 mg/dL — ABNORMAL HIGH (ref 70–99)
Glucose-Capillary: 130 mg/dL — ABNORMAL HIGH (ref 70–99)
Glucose-Capillary: 148 mg/dL — ABNORMAL HIGH (ref 70–99)
Glucose-Capillary: 152 mg/dL — ABNORMAL HIGH (ref 70–99)
Glucose-Capillary: 90 mg/dL (ref 70–99)

## 2021-01-28 MED ORDER — ONDANSETRON HCL 4 MG/2ML IJ SOLN
4.0000 mg | Freq: Four times a day (QID) | INTRAMUSCULAR | Status: DC | PRN
  Administered 2021-01-28: 4 mg via INTRAVENOUS
  Filled 2021-01-28: qty 2

## 2021-01-28 NOTE — Progress Notes (Signed)
Physical Therapy Treatment Patient Details Name: Michelle Graves MRN: [id number] DOB: 1951-01-03 Today's Date: 01/28/2021    History of Present Illness 70 y.o. female presenting to ED after an unwitnessed fall at home. Patient admitted with sepsis 2/2 UTI, acute metabolic encephalopathy, and hypoglycemia. Patient with several hospital admissions with most recent 2/19-23 2/2 multifactorial acute metabolic encephalopathy. PMHx significant for HTN, CVA in 2020, RA, and CKD IIIb.    PT Comments    Pt reports she does not feel well this morning. RN gave Zophran for nausea before ambulating. Pt able to come to EOB with min-guard A. Worked on standing balance and pregait activitis and then ambulated within room with min A and RW. Pt encouraged to go out in hallway but she reported being too fatigued. Pt went to recliner but declined staying there in favor of bed. Continue to rec SNF at d/c. PT will continue to follow.    Follow Up Recommendations  SNF;Supervision/Assistance - 24 hour     Equipment Recommendations  None recommended by PT    Recommendations for Other Services       Precautions / Restrictions Precautions Precautions: Fall Restrictions Weight Bearing Restrictions: No    Mobility  Bed Mobility Overal bed mobility: Needs Assistance Bed Mobility: Supine to Sit     Supine to sit: Min guard Sit to supine: Min guard   General bed mobility comments: pt able to come to EOB without physical assist, increased time needed and use of rail. Pt able to elevate LE's for return to supine as well as assist with push to [facility].    Transfers Overall transfer level: Needs assistance Equipment used: Rolling walker (2 wheeled) Transfers: Sit to/from [employer] Sit to Stand: Min assist Stand pivot transfers: Min assist       General transfer comment: min A from bed and recliner to steady. vc's for hand placement from recliner  Ambulation/Gait Ambulation/Gait  assistance: Min assist Gait Distance (Feet): 20 Feet Assistive device: Rolling walker (2 wheeled) Gait Pattern/deviations: Step-to pattern;Decreased stride length;Trunk flexed;Wide base of support Gait velocity: decreased Gait velocity interpretation: <1.31 ft/sec, indicative of household ambulator General Gait Details: vc's to stay within RW. Encouraged pt to ambulate into hallway but she reports feeling too tired for this. Occasionally comes down onto forearms on RW.   Stairs             Wheelchair Mobility    Modified Rankin (Stroke Patients Only)       Balance Overall balance assessment: Needs assistance Sitting-balance support: Single extremity supported;No upper extremity supported;Feet supported Sitting balance-[name]: Good     Standing balance support: Bilateral upper extremity supported;During functional activity Standing balance-[name]: Poor Standing balance comment: needs at least one UE on stable surface. was able to let go of RW with one hand to assist with pulling up brief in standing.                            Cognition Arousal/Alertness: Awake/alert Behavior During Therapy: WFL for tasks assessed/performed Overall Cognitive Status: No family/caregiver present to determine baseline cognitive functioning Area of Impairment: Memory;Following commands;Safety/judgement;Awareness;Problem solving                   Current Attention Level: Selective Memory: Decreased short-term memory Following Commands: Follows one step commands with increased time;Follows one step commands consistently Safety/Judgement: Decreased awareness of safety;Decreased awareness of deficits Awareness: Intellectual Problem Solving: Slow processing;Requires verbal  cues General Comments: A&Ox4 but with STM deficits noted, may be close to baseline? Fatigued today      Exercises      General Comments General comments (skin integrity, edema, etc.):  encouraged pt to stay in chair for awhile but she wanted to get back in bed. Agreed to get in chair for lunch and dinner and relayed this to her RN      Pertinent Vitals/Pain Pain Assessment: Faces Faces Pain Scale: Hurts little more Pain Location: generalized Pain Descriptors / Indicators: Aching;Sore Pain Intervention(s): Limited activity within patient's tolerance;Monitored during session    Home Living                      Prior Function            PT Goals (current goals can now be found in the care plan section) Acute Rehab PT Goals Patient Stated Goal: To return home. PT Goal Formulation: With patient Time For Goal Achievement: 02/08/21 Potential to Achieve Goals: Fair Progress towards PT goals: Progressing toward goals    Frequency    Min 3X/week      PT Plan Current plan remains appropriate    Co-evaluation              AM-PAC PT "6 Clicks" Mobility   Outcome Measure  Help needed turning from your back to your side while in a flat bed without using bedrails?: A Little Help needed moving from lying on your back to sitting on the side of a flat bed without using bedrails?: A Little Help needed moving to and from a bed to a chair (including a wheelchair)?: A Little Help needed standing up from a chair using your arms (e.g., wheelchair or bedside chair)?: A Little Help needed to walk in hospital room?: A Little Help needed climbing 3-5 steps with a railing? : A Lot 6 Click Score: 17    End of Session Equipment Utilized During Treatment: Gait belt Activity Tolerance: Patient tolerated treatment well Patient left: in bed;with call bell/phone within reach;with bed alarm set Nurse Communication: Mobility status PT Visit Diagnosis: Unsteadiness on feet (R26.81);Muscle weakness (generalized) (M62.81);Difficulty in walking, not elsewhere classified (R26.2);History of falling (Z91.81)     Time: 7096-2836 PT Time Calculation (min) (ACUTE ONLY): 37  min  Charges:  $Gait Training: 8-22 mins $Therapeutic Activity: 8-22 mins                     .Lyanne Co, PT  Acute Rehab Services  Pager [phone] Office [phone]    Michelle Graves 01/28/2021, 12:02 PM

## 2021-01-28 NOTE — TOC Progression Note (Signed)
Transition of Care [facility]) - Progression Note    Patient Details  Name: Michelle Graves MRN: [id number] Date of Birth: Feb 27, 1951  [facility] CM/SW Contact  Carley Hammed, [place] Phone Number: 01/28/2021, 12:03 PM  Clinical Narrative:    CSW spoke with several facilities this morning, to determine if any could take this pt with pending Medicaid, as the family is unable to afford the copays at this time. [facility]on stated they could as long as the asset check comes back. [facility] stated they would look into it and get back to CSW. CSW updated [name], the dtr, who noted that she would prefer [facility] and would like [facility] as a very last choice, since they had heard bad things about it. [name] is currently doing the Medicaid application. CSW was notified that pt would be transferred to the DTP team for further assistance. CSW updated dtr about the switch in service and she noted understanding. CSW will sign off at this time, SW DTP will continue to follow for a safe DC plan.   Expected Discharge Plan: Skilled Nursing Facility Barriers to Discharge: Continued Medical Work up,Insurance Authorization  Expected Discharge Plan and Services Expected Discharge Plan: Skilled Nursing Facility     Post Acute Care Choice: Skilled Nursing Facility Living arrangements for the past 2 months: Apartment                                       Social Determinants of Health (SDOH) Interventions    Readmission Risk Interventions Readmission Risk Prevention Plan 05/10/2020 05/10/2020 10/28/2019  Transportation Screening - Complete Complete  PCP or Specialist Appt within 3-5 Days - - -  Not Complete comments - - -  HRI or Home Care Consult - - -  HRI or Home Care Consult comments - - -  Social Work Consult for Recovery Care Planning/Counseling - - -  Palliative Care Screening - - -  Medication Review [facility]) - Referral to Pharmacy Referral to Pharmacy  PCP or Specialist  appointment within 3-5 days of discharge Complete Complete Not Complete  PCP/Specialist Appt Not Complete comments - - due to holiday, pt to make appointment  HRI or Home Care Consult - Complete Complete  SW Recovery Care/Counseling Consult - - Complete  Palliative Care Screening - Not Applicable Not Applicable  Skilled Nursing Facility - Patient Refused Complete  Some recent data might be hidden

## 2021-01-28 NOTE — Progress Notes (Addendum)
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 -Daughter had talked to her at the previous day when she was in her usual state of health, on 3/28 she was found on the floor poorly responsive and brought to the ED. recently hospitalized 2/19-2/23 with acute metabolic encephalopathy) secondary to hypoglycemia, CVA and/or medication she was noted to have a left lateral cerebellar infarct on MRI then -This time in the ED she was afebrile, hypertensive, with fluctuating O2 sats, hypoglycemic, CT head and cervical spine were unremarkable, labs noted mild leukocytosis, abnormal urinalysis, she was agitated after she woke up and subsequently given Ativan and Versed, subsequently became more delirious and was admitted to the ICU on Precedex. -Did not require mechanical ventilation, transferred from PCCM to [facility] service today 4/1   Assessment & Plan:     Acute metabolic encephalopathy -Multifactorial, hypoglycemia, UTI, polypharmacy -May also have a component of cognitive deficits, memory loss at baseline, possible vascular dementia, this was noted during last hospitalization and rehab stay -CBGs are stable, D10 discontinued -Urine culture with staph epidermidis, off ceftriaxone for now -Blood culture notes staph epidermis is in 1 out of 2 sets, significance is unknown, repeat blood cultures are negative -Seroquel held -PT OT eval completed, SNF recommended for rehab -Social work following, difficult placement  Sepsis, UTI -Treated with IV Rocephin x5 days, urine culture with staph epi -Staph epidermidis and hominis, likely contaminant and blood cultures -Repeat blood cultures are negative -Monitor off  antibiotics, remains afebrile without leukocytosis  Acute kidney injury Intermittent urinary retention -Baseline creatinine around 1.4-1.5, creatinine was 1.7 on admission -Stable now -Foley catheter discontinued 4/1, urinating without difficulty now  Lower back, tailbone discomfort L5 compression fracture, this was seen on imaging 2 months ago as well -Supportive care, physical therapy -Continue Robaxin as tolerated  Constipation Chronic colonic stricture -Multiple BMs overnight done today -Hold laxatives today, restart Senokot tomorrow  Hypertension -Stable now, continue Coreg and diltiazem, clonidine held  Hypokalemia Hypomagnesemia -Repleted  History of CVA -Continue aspirin -PT eval  Chronic anemia -Hemoglobin slightly lower possibly from hemodilution, continue to trend  DVT prophylaxis: Lovenox Code Status: Full code Family Communication: No family at bedside, will update daughter Disposition Plan:  Status is: Inpatient  Remains inpatient appropriate because:Inpatient level of care appropriate due to severity of illness   Dispo: The patient is from: Home              Anticipated d/c is to: SNF, 1 to 2 days              Patient currently is medically stable to d/c.   Difficult to place patient No  Consultants:   Procedures:   Antimicrobials:    Subjective: -Denies any low back and tailbone pain today, reports having multiple loose, soft stools after laxatives yesterday  Objective: Vitals:   01/28/21 0336 01/28/21 0848 01/28/21 1118 01/28/21 1200  BP: 125/78 121/72 120/77 122/73  Pulse: 78 86  81  Resp: 18 18  20   Temp: 98.2 F (36.8 C) 98.2 F (36.8 C)  100 F (37.8 C)  TempSrc: Oral Oral  Axillary  SpO2: 99% 97%  100%  Weight:      Height:  Intake/Output Summary (Last 24 hours) at 01/28/2021 1326 Last data filed at 01/28/2021 0800 Gross per 24 hour  Intake 200 ml  Output 150 ml  Net 50 ml   Filed Weights   01/21/21 0924 01/21/21  2200  Weight: 86.5 kg 86.7 kg    Examination:  General exam: Pleasant chronically ill female appears older than stated age, awake alert oriented to self place and partly to time  CVS: S1-S2, regular rate rhythm Lungs: Decreased breath sounds the bases otherwise clear Abdomen: Soft, nontender, bowel sounds present  Extremities: No edema Neuro: Moves all extremities, no localizing signs Psychiatry: Poor insight and judgment    Data Reviewed:   CBC: Recent Labs  Lab 01/24/21 0408 01/25/21 0449 01/26/21 0246 01/27/21 0140 01/28/21 0612  WBC 13.5* 9.6 8.4 7.3 7.4  HGB 11.5* 9.8* 10.0* 10.0* 9.8*  HCT 34.1* 29.0* 30.4* 30.3* 30.3*  MCV 93.2 92.9 94.1 94.4 94.1  PLT 188 187 211 229 226   Basic Metabolic Panel: Recent Labs  Lab 01/24/21 0408 01/25/21 0449 01/26/21 0246 01/27/21 0140 01/28/21 0612  NA 135 134* 134* 137 139  K 3.4* 3.5 4.0 3.7 3.9  CL 105 104 104 106 108  CO2 20* 22 20* 25 23  GLUCOSE 158* 121* 87 126* 118*  BUN 11 16 22 23 21   CREATININE 1.12* 1.42* 1.46* 1.46* 1.17*  CALCIUM 9.1 8.5* 8.4* 8.9 9.0  MG 1.5* 2.2  --   --   --   PHOS  --  4.1  --   --   --    GFR: Estimated Creatinine Clearance: 52 mL/min (A) (by C-G formula based on SCr of 1.17 mg/dL (H)). Liver Function Tests: Recent Labs  Lab 01/23/21 1136  AST 27  ALT 19  ALKPHOS 66  BILITOT 0.5  PROT 6.5  ALBUMIN 3.4*   Recent Labs  Lab 01/23/21 1136  LIPASE 24   No results for input(s): AMMONIA in the last 168 hours. Coagulation Profile: No results for input(s): INR, PROTIME in the last 168 hours. Cardiac Enzymes: No results for input(s): CKTOTAL, CKMB, CKMBINDEX, TROPONINI in the last 168 hours. BNP (last 3 results) No results for input(s): PROBNP in the last 8760 hours. HbA1C: Recent Labs    01/26/21 0246  HGBA1C 6.4*   CBG: Recent Labs  Lab 01/27/21 2047 01/28/21 0013 01/28/21 0407 01/28/21 0803 01/28/21 1211  GLUCAP 110* 90 130* 112* 152*   Lipid Profile: No  results for input(s): CHOL, HDL, LDLCALC, TRIG, CHOLHDL, LDLDIRECT in the last 72 hours. Thyroid Function Tests: No results for input(s): TSH, T4TOTAL, FREET4, T3FREE, THYROIDAB in the last 72 hours. Anemia Panel: No results for input(s): VITAMINB12, FOLATE, FERRITIN, TIBC, IRON, RETICCTPCT in the last 72 hours. Urine analysis:    Component Value Date/Time   COLORURINE AMBER (A) 01/21/2021 0948   APPEARANCEUR CLOUDY (A) 01/21/2021 0948   LABSPEC 1.018 01/21/2021 0948   PHURINE 6.0 01/21/2021 0948   GLUCOSEU NEGATIVE 01/21/2021 0948   HGBUR NEGATIVE 01/21/2021 0948   BILIRUBINUR NEGATIVE 01/21/2021 0948   KETONESUR NEGATIVE 01/21/2021 0948   PROTEINUR 100 (A) 01/21/2021 0948   UROBILINOGEN 1.0 06/24/2010 1850   NITRITE POSITIVE (A) 01/21/2021 0948   LEUKOCYTESUR LARGE (A) 01/21/2021 0948   Sepsis Labs: @LABRCNTIP (procalcitonin:4,lacticidven:4)  ) Recent Results (from the past 240 hour(s))  Blood Cultures (routine x 2)     Status: Abnormal   Collection Time: 01/21/21  9:48 AM   Specimen: BLOOD  Result Value Ref Range Status  Specimen Description BLOOD SITE NOT SPECIFIED  Final   Special Requests   Final    BOTTLES DRAWN AEROBIC AND ANAEROBIC Blood Culture adequate volume   Culture  Setup Time   Final    IN BOTH AEROBIC AND ANAEROBIC BOTTLES GRAM POSITIVE COCCI CRITICAL RESULT CALLED TO, READ BACK BY AND VERIFIED WITH: PHARMD GREG A. [id number] FCP    Culture (A)  Final    STAPHYLOCOCCUS HOMINIS STAPHYLOCOCCUS EPIDERMIDIS THE SIGNIFICANCE OF ISOLATING THIS ORGANISM FROM A SINGLE SET OF BLOOD CULTURES WHEN MULTIPLE SETS ARE DRAWN IS UNCERTAIN. PLEASE NOTIFY THE MICROBIOLOGY DEPARTMENT WITHIN ONE WEEK IF SPECIATION AND SENSITIVITIES ARE REQUIRED. Performed at [facility], [address], [place], [place] [address]    Report Status 01/24/2021 FINAL  Final  Urine culture     Status: Abnormal   Collection Time: 01/21/21  9:48 AM   Specimen: Urine, Random  Result Value  Ref Range Status   Specimen Description URINE, RANDOM  Final   Special Requests   Final    NONE Performed at [facility], [address] [place], [place] [address]    Culture >=100,000 COLONIES/mL STAPHYLOCOCCUS EPIDERMIDIS (A)  Final   Report Status 01/23/2021 FINAL  Final   Organism ID, Bacteria STAPHYLOCOCCUS EPIDERMIDIS (A)  Final      Susceptibility   Staphylococcus epidermidis - MIC*    CIPROFLOXACIN 2 INTERMEDIATE Intermediate     GENTAMICIN 8 INTERMEDIATE Intermediate     NITROFURANTOIN <=16 SENSITIVE Sensitive     OXACILLIN >=4 RESISTANT Resistant     TETRACYCLINE <=1 SENSITIVE Sensitive     VANCOMYCIN 1 SENSITIVE Sensitive     TRIMETH/SULFA 20 SENSITIVE Sensitive     CLINDAMYCIN <=0.25 SENSITIVE Sensitive     RIFAMPIN <=0.5 SENSITIVE Sensitive     Inducible Clindamycin NEGATIVE Sensitive     * >=100,000 COLONIES/mL STAPHYLOCOCCUS EPIDERMIDIS  Blood Culture ID Panel (Reflexed)     Status: Abnormal   Collection Time: 01/21/21  9:48 AM  Result Value Ref Range Status   Enterococcus faecalis NOT DETECTED NOT DETECTED Final   Enterococcus Faecium NOT DETECTED NOT DETECTED Final   Listeria monocytogenes NOT DETECTED NOT DETECTED Final   Staphylococcus species DETECTED (A) NOT DETECTED Final    Comment: CRITICAL RESULT CALLED TO, READ BACK BY AND VERIFIED WITH: PHARMD GREG A. [id number] FCP    Staphylococcus aureus (BCID) NOT DETECTED NOT DETECTED Final   Staphylococcus epidermidis DETECTED (A) NOT DETECTED Final    Comment: Methicillin (oxacillin) resistant coagulase negative staphylococcus. Possible blood culture contaminant (unless isolated from more than one blood culture draw or clinical case suggests pathogenicity). No antibiotic treatment is indicated for blood  culture contaminants. CRITICAL RESULT CALLED TO, READ BACK BY AND VERIFIED WITH: PHARMD GREG A. [id number] FCP    Staphylococcus lugdunensis NOT DETECTED NOT DETECTED Final   Streptococcus species  NOT DETECTED NOT DETECTED Final   Streptococcus agalactiae NOT DETECTED NOT DETECTED Final   Streptococcus pneumoniae NOT DETECTED NOT DETECTED Final   Streptococcus pyogenes NOT DETECTED NOT DETECTED Final   A.calcoaceticus-baumannii NOT DETECTED NOT DETECTED Final   Bacteroides fragilis NOT DETECTED NOT DETECTED Final   Enterobacterales NOT DETECTED NOT DETECTED Final   Enterobacter cloacae complex NOT DETECTED NOT DETECTED Final   Escherichia coli NOT DETECTED NOT DETECTED Final   Klebsiella aerogenes NOT DETECTED NOT DETECTED Final   Klebsiella oxytoca NOT DETECTED NOT DETECTED Final   Klebsiella pneumoniae NOT DETECTED NOT DETECTED Final  Proteus species NOT DETECTED NOT DETECTED Final   Salmonella species NOT DETECTED NOT DETECTED Final   Serratia marcescens NOT DETECTED NOT DETECTED Final   Haemophilus influenzae NOT DETECTED NOT DETECTED Final   Neisseria meningitidis NOT DETECTED NOT DETECTED Final   Pseudomonas aeruginosa NOT DETECTED NOT DETECTED Final   Stenotrophomonas maltophilia NOT DETECTED NOT DETECTED Final   Candida albicans NOT DETECTED NOT DETECTED Final   Candida auris NOT DETECTED NOT DETECTED Final   Candida glabrata NOT DETECTED NOT DETECTED Final   Candida krusei NOT DETECTED NOT DETECTED Final   Candida parapsilosis NOT DETECTED NOT DETECTED Final   Candida tropicalis NOT DETECTED NOT DETECTED Final   Cryptococcus neoformans/gattii NOT DETECTED NOT DETECTED Final   Methicillin resistance mecA/C DETECTED (A) NOT DETECTED Final    Comment: CRITICAL RESULT CALLED TO, READ BACK BY AND VERIFIED WITH: PHARMD GREG A. [id number] FCP Performed at [facility], [address] [place], [place] [address]   Blood Cultures (routine x 2)     Status: None   Collection Time: 01/21/21  9:53 AM   Specimen: BLOOD RIGHT HAND  Result Value Ref Range Status   Specimen Description BLOOD RIGHT HAND  Final   Special Requests   Final    BOTTLES DRAWN AEROBIC ONLY Blood  Culture results may not be optimal due to an inadequate volume of blood received in culture bottles   Culture   Final    NO GROWTH 5 DAYS Performed at [facility], [address] [place], [place] [address]    Report Status 01/26/2021 FINAL  Final  MRSA PCR Screening     Status: None   Collection Time: 01/21/21 10:59 PM   Specimen: Nasal Mucosa; Nasopharyngeal  Result Value Ref Range Status   MRSA by PCR NEGATIVE NEGATIVE Final    Comment:        The GeneXpert MRSA Assay (FDA approved for NASAL specimens only), is one component of a comprehensive MRSA colonization surveillance program. It is not intended to diagnose MRSA infection nor to guide or monitor treatment for MRSA infections. Performed at [facility], [address] [place], [place] [address]   SARS CORONAVIRUS 2 (TAT 6-24 HRS) Nasopharyngeal Nasopharyngeal Swab     Status: None   Collection Time: 01/21/21 11:14 PM   Specimen: Nasopharyngeal Swab  Result Value Ref Range Status   SARS Coronavirus 2 NEGATIVE NEGATIVE Final    Comment: (NOTE) SARS-CoV-2 target nucleic acids are NOT DETECTED.  The SARS-CoV-2 RNA is generally detectable in upper and lower respiratory specimens during the acute phase of infection. Negative results do not preclude SARS-CoV-2 infection, do not rule out co-infections with other pathogens, and should not be used as the sole basis for treatment or other patient management decisions. Negative results must be combined with clinical observations, patient history, and epidemiological information. The expected result is Negative.  Fact Sheet for Patients: HairSlick.no  Fact Sheet for Healthcare Providers: quierodirigir.com  This test is not yet approved or cleared by the [place] FDA and  has been authorized for detection and/or diagnosis of SARS-CoV-2 by FDA under an Emergency Use Authorization (EUA). This EUA will  remain  in effect (meaning this test can be used) for the duration of the COVID-19 declaration under Se ction 564(b)(1) of the Act, 21 U.S.C. section 360bbb-3(b)(1), unless the authorization is terminated or revoked sooner.  Performed at [facility], [address] [place], [place] [address]  Culture, blood (routine x 2)     Status: None   Collection Time: 01/22/21 11:14 AM   Specimen: BLOOD LEFT HAND  Result Value Ref Range Status   Specimen Description BLOOD LEFT HAND  Final   Special Requests   Final    BOTTLES DRAWN AEROBIC AND ANAEROBIC Blood Culture results may not be optimal due to an inadequate volume of blood received in culture bottles   Culture   Final    NO GROWTH 5 DAYS Performed at [facility], [address] [place], [place] [address]    Report Status 01/27/2021 FINAL  Final  Culture, blood (routine x 2)     Status: None   Collection Time: 01/22/21 11:16 AM   Specimen: BLOOD LEFT ARM  Result Value Ref Range Status   Specimen Description BLOOD LEFT ARM  Final   Special Requests   Final    BOTTLES DRAWN AEROBIC AND ANAEROBIC Blood Culture results may not be optimal due to an inadequate volume of blood received in culture bottles   Culture   Final    NO GROWTH 5 DAYS Performed at [facility], [address] [place], [place] [address]    Report Status 01/27/2021 FINAL  Final    Radiology Studies: No results found.  Scheduled Meds: . carvedilol  25 mg Oral BID WC  . diltiazem  180 mg Oral BID  . enoxaparin (LOVENOX) injection  40 mg Subcutaneous Q24H  . feeding supplement  1 Container Oral TID BM  . neomycin-bacitracin-polymyxin   Topical BID  . potassium chloride  40 mEq Oral Once  . sodium chloride flush  3 mL Intravenous Q12H   Continuous Infusions:    LOS: 7 days    Time spent:  Zannie Cove, MD [facility]  01/28/2021, 1:26 PM

## 2021-01-28 NOTE — Plan of Care (Signed)

## 2021-01-29 LAB — CBC
HCT: 28.8 % — ABNORMAL LOW (ref 36.0–46.0)
Hemoglobin: 9.3 g/dL — ABNORMAL LOW (ref 12.0–15.0)
MCH: 30.9 pg (ref 26.0–34.0)
MCHC: 32.3 g/dL (ref 30.0–36.0)
MCV: 95.7 fL (ref 80.0–100.0)
Platelets: 250 10*3/uL (ref 150–400)
RBC: 3.01 MIL/uL — ABNORMAL LOW (ref 3.87–5.11)
RDW: 14.3 % (ref 11.5–15.5)
WBC: 9.7 10*3/uL (ref 4.0–10.5)
nRBC: 0 % (ref 0.0–0.2)

## 2021-01-29 LAB — COMPREHENSIVE METABOLIC PANEL
ALT: 15 U/L (ref 0–44)
AST: 13 U/L — ABNORMAL LOW (ref 15–41)
Albumin: 2.9 g/dL — ABNORMAL LOW (ref 3.5–5.0)
Alkaline Phosphatase: 62 U/L (ref 38–126)
Anion gap: 7 (ref 5–15)
BUN: 21 mg/dL (ref 8–23)
CO2: 23 mmol/L (ref 22–32)
Calcium: 9 mg/dL (ref 8.9–10.3)
Chloride: 111 mmol/L (ref 98–111)
Creatinine, Ser: 1.2 mg/dL — ABNORMAL HIGH (ref 0.44–1.00)
GFR, Estimated: 49 mL/min — ABNORMAL LOW (ref >60)
Glucose, Bld: 106 mg/dL — ABNORMAL HIGH (ref 70–99)
Potassium: 3.8 mmol/L (ref 3.5–5.1)
Sodium: 141 mmol/L (ref 135–145)
Total Bilirubin: 0.5 mg/dL (ref 0.3–1.2)
Total Protein: 5.9 g/dL — ABNORMAL LOW (ref 6.5–8.1)

## 2021-01-29 LAB — GLUCOSE, CAPILLARY
Glucose-Capillary: 104 mg/dL — ABNORMAL HIGH (ref 70–99)
Glucose-Capillary: 108 mg/dL — ABNORMAL HIGH (ref 70–99)
Glucose-Capillary: 117 mg/dL — ABNORMAL HIGH (ref 70–99)
Glucose-Capillary: 117 mg/dL — ABNORMAL HIGH (ref 70–99)
Glucose-Capillary: 124 mg/dL — ABNORMAL HIGH (ref 70–99)
Glucose-Capillary: 99 mg/dL (ref 70–99)

## 2021-01-29 MED ORDER — LACTULOSE 10 GM/15ML PO SOLN: 10.0000 g | Freq: Every day | ORAL | 0 refills | Status: DC | PRN

## 2021-01-29 MED ORDER — SENNOSIDES-DOCUSATE SODIUM 8.6-50 MG PO TABS: 1.0000 | ORAL_TABLET | Freq: Two times a day (BID) | ORAL | Status: DC

## 2021-01-29 MED ORDER — METHOCARBAMOL 500 MG PO TABS: 500.0000 mg | ORAL_TABLET | Freq: Three times a day (TID) | ORAL | Status: DC | PRN

## 2021-01-29 MED ORDER — SENNOSIDES-DOCUSATE SODIUM 8.6-50 MG PO TABS: 1.0000 | ORAL_TABLET | Freq: Every day | ORAL | Status: DC

## 2021-01-29 NOTE — Progress Notes (Addendum)
2:45pm: CSW received insurance authorization from [facility] [phone]. Patient will discharge to [facility] tomorrow morning.  CSW informed patient's daughter of discharge plan.  11:30am: Patient has been offered a bed at [facility] will [employer] authorization.  9am: CSW spoke with patient's daughter [name] to discuss discharge planning - [name] states her mother was previously at [facility] for rehab from 12/20/20 to 01/03/21. [name] reports has some confusion but has not received a formal dementia diagnosis. [name] requesting her mother receive additional short term rehab at a facility, preferably [facility] or [facility].  Patient's Medicaid case worker is Mrs. Helene Kelp at [facility] @ [phone] - CSW left a voicemail requesting a return call. Patient has active Medicaid - #[id number] N.   CSW spoke with [facility] representative to discuss SNF benefits - patient is covered at 100% for days 1-20 and 41-100, but for days 21-40 there is a copay of $188 daily.  CSW spoke with Lexine Baton at [facility] - patient has met her $3,600 out of pocket maximum so she is covered and is not required to pay any additional funds out of pocket. Nikki to discuss patient's return with DON and will return call to [facility].  Madilyn Fireman, MSW, LCSW Transitions of Care  Clinical Social Worker II [phone]

## 2021-01-29 NOTE — Progress Notes (Signed)
Physical Therapy Treatment Patient Details Name: Michelle Graves MRN: [id number] DOB: 02-Dec-1950 Today's Date: 01/29/2021    History of Present Illness 70 y.o. female presenting to ED after an unwitnessed fall at home. Patient admitted with sepsis 2/2 UTI, acute metabolic encephalopathy, and hypoglycemia. Patient with several hospital admissions with most recent 2/19-23 2/2 multifactorial acute metabolic encephalopathy. PMHx significant for HTN, CVA in 2020, RA, and CKD IIIb.    PT Comments    Pt denies nausea today but relays that she still does not feel like herself. Pt agreeable to mobility today but limited by uncontrolled diarrhea. Performed sit<>stand from bed and recliner multiple times for strengthening. Pt performed LE there ex in sitting. Min A to transfer surface to surface with use of RW. PT will continue to follow.    Follow Up Recommendations  SNF;Supervision/Assistance - 24 hour     Equipment Recommendations  None recommended by PT    Recommendations for Other Services       Precautions / Restrictions Precautions Precautions: Fall Precaution Comments: incontinent of bowel today Restrictions Weight Bearing Restrictions: No    Mobility  Bed Mobility Overal bed mobility: Needs Assistance Bed Mobility: Supine to Sit     Supine to sit: Min guard     General bed mobility comments: pt cued to use bed rail to come to sitting EOB. Increased time needed    Transfers Overall transfer level: Needs assistance Equipment used: Rolling walker (2 wheeled) Transfers: Sit to/from [employer] Sit to Stand: Min assist Stand pivot transfers: Min assist       General transfer comment: min A from bed and recliner to steady. vc's for hand placement from recliner  Ambulation/Gait             General Gait Details: unable to ambulate today due to uncontrolled diarrhea   Stairs             Wheelchair Mobility    Modified Rankin (Stroke  Patients Only)       Balance Overall balance assessment: Needs assistance Sitting-balance support: Single extremity supported;No upper extremity supported;Feet supported Sitting balance-[name]: Good     Standing balance support: Bilateral upper extremity supported;During functional activity Standing balance-[name]: Poor Standing balance comment: needs at least one UE on stable surface.                            Cognition Arousal/Alertness: Awake/alert Behavior During Therapy: WFL for tasks assessed/performed Overall Cognitive Status: No family/caregiver present to determine baseline cognitive functioning Area of Impairment: Memory;Following commands;Safety/judgement;Awareness;Problem solving;Attention                 Orientation Level: Disoriented to;Place;Situation Current Attention Level: Selective Memory: Decreased short-term memory Following Commands: Follows one step commands with increased time;Follows one step commands consistently Safety/Judgement: Decreased awareness of safety;Decreased awareness of deficits Awareness: Intellectual Problem Solving: Slow processing;Requires verbal cues General Comments: Alert and oriented, selective attention noted as well as STM deficits      Exercises General Exercises - Lower Extremity Ankle Circles/Pumps: AROM;Both;15 reps;Seated Long Arc Quad: AROM;Both;10 reps;Seated Hip Flexion/Marching: AROM;Both;10 reps;Seated Toe Raises: AROM;Both;10 reps;Seated Heel Raises: AROM;Both;10 reps;Seated    General Comments General comments (skin integrity, edema, etc.): VSS      Pertinent Vitals/Pain Pain Assessment: Faces Faces Pain Scale: No hurt    Home Living  Prior Function            PT Goals (current goals can now be found in the care plan section) Acute Rehab PT Goals Patient Stated Goal: To return home. PT Goal Formulation: With patient Time For Goal Achievement:  02/08/21 Potential to Achieve Goals: Fair Progress towards PT goals: Progressing toward goals    Frequency    Min 3X/week      PT Plan Current plan remains appropriate    Co-evaluation              AM-PAC PT "6 Clicks" Mobility   Outcome Measure  Help needed turning from your back to your side while in a flat bed without using bedrails?: A Little Help needed moving from lying on your back to sitting on the side of a flat bed without using bedrails?: A Little Help needed moving to and from a bed to a chair (including a wheelchair)?: A Little Help needed standing up from a chair using your arms (e.g., wheelchair or bedside chair)?: A Little Help needed to walk in hospital room?: A Little Help needed climbing 3-5 steps with a railing? : A Lot 6 Click Score: 17    End of Session Equipment Utilized During Treatment: Gait belt Activity Tolerance: Treatment limited secondary to medical complications (Comment) (diarrhea) Patient left: with call bell/phone within reach;in chair;with chair alarm set Nurse Communication: Mobility status PT Visit Diagnosis: Unsteadiness on feet (R26.81);Muscle weakness (generalized) (M62.81);Difficulty in walking, not elsewhere classified (R26.2);History of falling (Z91.81)     Time: 2751-7001 PT Time Calculation (min) (ACUTE ONLY): 25 min  Charges:  $Therapeutic Exercise: 8-22 mins $Therapeutic Activity: 23-37 mins                     Lyanne Co, PT  Acute Rehab Services  Pager [phone] Office [phone]    Lawana Chambers Yarisbel Miranda 01/29/2021, 2:14 PM

## 2021-01-29 NOTE — Progress Notes (Signed)
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 -Daughter had talked to her at the previous day when she was in her usual state of health, on 3/28 she was found on the floor poorly responsive and brought to the ED. recently hospitalized 2/19-2/23 with acute metabolic encephalopathy) secondary to hypoglycemia, CVA and/or medication she was noted to have a left lateral cerebellar infarct on MRI then. -This time in the ED she was afebrile, hypertensive, with fluctuating O2 sats, hypoglycemic, CT head and cervical spine were unremarkable, labs noted mild leukocytosis, abnormal urinalysis, she was agitated after she woke up and given Ativan and Versed, subsequently became more delirious and was admitted to the ICU on Precedex.  Also treated with ceftriaxone and D10 drip -Did not require mechanical ventilation, transferred from PCCM to [facility] service  4/1   Assessment & Plan:     Acute metabolic encephalopathy -Multifactorial, hypoglycemia, UTI, polypharmacy -May also have a component of cognitive deficits, memory loss at baseline, possible vascular dementia, this was noted during last hospitalization and rehab stay -CBGs improved and stable,, D10 discontinued -Urine culture with staph epidermidis, off ceftriaxone now -Blood culture notes staph epidermis is in 1 out of 2 sets, significance is unknown, repeat blood cultures are negative -Seroquel held -Mental status improved and stable now -PT OT eval completed, SNF recommended for rehab -Social work following, discharge planning  Sepsis, UTI -Treated with IV Rocephin x5 days, urine culture with staph epi -Staph epidermidis and hominis, likely  contaminant and blood cultures -Repeat blood cultures are negative -Monitor off antibiotics, remains afebrile without leukocytosis  Acute kidney injury Intermittent urinary retention -Baseline creatinine around 1.4-1.5, creatinine was 1.7 on admission -Stable now -Foley catheter discontinued 4/2, urinating without difficulty now  Lower back, tailbone discomfort L5 compression fracture, this was seen on imaging 2 months ago as well -Supportive care, physical therapy -Continue Robaxin as tolerated  Constipation Chronic colonic stricture -Multiple BMs overnight done 2 days ago following enema and laxatives -Restart Senokot tomorrow  Hypertension -Stable now, continue Coreg and diltiazem, clonidine held  Hypokalemia Hypomagnesemia -Repleted  History of CVA -Continue aspirin -PT eval  Chronic anemia -Hemoglobin slightly lower possibly from hemodilution, continue to trend  DVT prophylaxis: Lovenox Code Status: Full code Family Communication: No family at bedside, will update daughter Disposition Plan:  Status is: Inpatient  Remains inpatient appropriate because:Inpatient level of care appropriate due to severity of illness   Dispo: The patient is from: Home              Anticipated d/c is to: SNF, 1 to 2 days              Patient currently is medically stable to d/c.   Difficult to place patient No  Consultants:   Procedures:   Antimicrobials:    Subjective: -Complains of nausea today  Objective: Vitals:   01/28/21 2055 01/29/21 0024 01/29/21 0331 01/29/21 0729  BP: 104/63 132/81 (!) 150/78 139/88  Pulse: 78 82 77 87  Resp: Temp: 98.6 F (37 C) 98.4 F (36.9 C) 99.3 F (37.4 C) 99.3 F (37.4 C)  TempSrc: Oral Oral Oral Oral  SpO2: 99% 98% 94% 98%  Weight:  Height:        Intake/Output Summary (Last 24 hours) at 01/29/2021 1106 Last data filed at 01/29/2021 0900 Gross per 24 hour  Intake 240 ml  Output 300 ml  Net -60 ml   Filed  Weights   01/21/21 0924 01/21/21 2200  Weight: 86.5 kg 86.7 kg    Examination:  General exam: Pleasant chronically ill female appears older than stated age, awake alert oriented to self and place, partly to time CVS: S1-S2, regular rate rhythm Lungs: Decreased breath sounds the bases otherwise clear Abdomen: Soft, nontender, bowel sounds present Extremities: No edema  Neuro: Moves all extremities, no localizing signs Psychiatry: Poor insight and judgment    Data Reviewed:   CBC: Recent Labs  Lab 01/25/21 0449 01/26/21 0246 01/27/21 0140 01/28/21 0612 01/29/21 0703  WBC 9.6 8.4 7.3 7.4 9.7  HGB 9.8* 10.0* 10.0* 9.8* 9.3*  HCT 29.0* 30.4* 30.3* 30.3* 28.8*  MCV 92.9 94.1 94.4 94.1 95.7  PLT 187 211 229 226 250   Basic Metabolic Panel: Recent Labs  Lab 01/24/21 0408 01/25/21 0449 01/26/21 0246 01/27/21 0140 01/28/21 0612 01/29/21 0703  NA 135 134* 134* 137 139 141  K 3.4* 3.5 4.0 3.7 3.9 3.8  CL 105 104 104 106 108 111  CO2 20* 22 20* 25 23 23   GLUCOSE 158* 121* 87 126* 118* 106*  BUN 11 16 22 23 21 21   CREATININE 1.12* 1.42* 1.46* 1.46* 1.17* 1.20*  CALCIUM 9.1 8.5* 8.4* 8.9 9.0 9.0  MG 1.5* 2.2  --   --   --   --   PHOS  --  4.1  --   --   --   --    GFR: Estimated Creatinine Clearance: 50.7 mL/min (A) (by C-G formula based on SCr of 1.2 mg/dL (H)). Liver Function Tests: Recent Labs  Lab 01/23/21 1136 01/29/21 0703  AST 27 13*  ALT 19 15  ALKPHOS 66 62  BILITOT 0.5 0.5  PROT 6.5 5.9*  ALBUMIN 3.4* 2.9*   Recent Labs  Lab 01/23/21 1136  LIPASE 24   No results for input(s): AMMONIA in the last 168 hours. Coagulation Profile: No results for input(s): INR, PROTIME in the last 168 hours. Cardiac Enzymes: No results for input(s): CKTOTAL, CKMB, CKMBINDEX, TROPONINI in the last 168 hours. BNP (last 3 results) No results for input(s): PROBNP in the last 8760 hours. HbA1C: No results for input(s): HGBA1C in the last 72 hours. CBG: Recent Labs   Lab 01/28/21 1211 01/28/21 1717 01/28/21 2054 01/29/21 0025 01/29/21 0343  GLUCAP 152* 125* 148* 108* 117*   Lipid Profile: No results for input(s): CHOL, HDL, LDLCALC, TRIG, CHOLHDL, LDLDIRECT in the last 72 hours. Thyroid Function Tests: No results for input(s): TSH, T4TOTAL, FREET4, T3FREE, THYROIDAB in the last 72 hours. Anemia Panel: No results for input(s): VITAMINB12, FOLATE, FERRITIN, TIBC, IRON, RETICCTPCT in the last 72 hours. Urine analysis:    Component Value Date/Time   COLORURINE AMBER (A) 01/21/2021 0948   APPEARANCEUR CLOUDY (A) 01/21/2021 0948   LABSPEC 1.018 01/21/2021 0948   PHURINE 6.0 01/21/2021 0948   GLUCOSEU NEGATIVE 01/21/2021 0948   HGBUR NEGATIVE 01/21/2021 0948   BILIRUBINUR NEGATIVE 01/21/2021 0948   KETONESUR NEGATIVE 01/21/2021 0948   PROTEINUR 100 (A) 01/21/2021 0948   UROBILINOGEN 1.0 06/24/2010 1850   NITRITE POSITIVE (A) 01/21/2021 0948   LEUKOCYTESUR LARGE (A) 01/21/2021 0948   Sepsis Labs: @LABRCNTIP (procalcitonin:4,lacticidven:4)  ) Recent Results (from the past 240 hour(s))  Blood Cultures (  routine x 2)     Status: Abnormal   Collection Time: 01/21/21  9:48 AM   Specimen: BLOOD  Result Value Ref Range Status   Specimen Description BLOOD SITE NOT SPECIFIED  Final   Special Requests   Final    BOTTLES DRAWN AEROBIC AND ANAEROBIC Blood Culture adequate volume   Culture  Setup Time   Final    IN BOTH AEROBIC AND ANAEROBIC BOTTLES GRAM POSITIVE COCCI CRITICAL RESULT CALLED TO, READ BACK BY AND VERIFIED WITH: PHARMD GREG A. [id number] FCP    Culture (A)  Final    STAPHYLOCOCCUS HOMINIS STAPHYLOCOCCUS EPIDERMIDIS THE SIGNIFICANCE OF ISOLATING THIS ORGANISM FROM A SINGLE SET OF BLOOD CULTURES WHEN MULTIPLE SETS ARE DRAWN IS UNCERTAIN. PLEASE NOTIFY THE MICROBIOLOGY DEPARTMENT WITHIN ONE WEEK IF SPECIATION AND SENSITIVITIES ARE REQUIRED. Performed at [facility], [address], [place], [place] [address]    Report Status  01/24/2021 FINAL  Final  Urine culture     Status: Abnormal   Collection Time: 01/21/21  9:48 AM   Specimen: Urine, Random  Result Value Ref Range Status   Specimen Description URINE, RANDOM  Final   Special Requests   Final    NONE Performed at [facility], [address] [place], [place] [address]    Culture >=100,000 COLONIES/mL STAPHYLOCOCCUS EPIDERMIDIS (A)  Final   Report Status 01/23/2021 FINAL  Final   Organism ID, Bacteria STAPHYLOCOCCUS EPIDERMIDIS (A)  Final      Susceptibility   Staphylococcus epidermidis - MIC*    CIPROFLOXACIN 2 INTERMEDIATE Intermediate     GENTAMICIN 8 INTERMEDIATE Intermediate     NITROFURANTOIN <=16 SENSITIVE Sensitive     OXACILLIN >=4 RESISTANT Resistant     TETRACYCLINE <=1 SENSITIVE Sensitive     VANCOMYCIN 1 SENSITIVE Sensitive     TRIMETH/SULFA 20 SENSITIVE Sensitive     CLINDAMYCIN <=0.25 SENSITIVE Sensitive     RIFAMPIN <=0.5 SENSITIVE Sensitive     Inducible Clindamycin NEGATIVE Sensitive     * >=100,000 COLONIES/mL STAPHYLOCOCCUS EPIDERMIDIS  Blood Culture ID Panel (Reflexed)     Status: Abnormal   Collection Time: 01/21/21  9:48 AM  Result Value Ref Range Status   Enterococcus faecalis NOT DETECTED NOT DETECTED Final   Enterococcus Faecium NOT DETECTED NOT DETECTED Final   Listeria monocytogenes NOT DETECTED NOT DETECTED Final   Staphylococcus species DETECTED (A) NOT DETECTED Final    Comment: CRITICAL RESULT CALLED TO, READ BACK BY AND VERIFIED WITH: PHARMD GREG A. [id number] FCP    Staphylococcus aureus (BCID) NOT DETECTED NOT DETECTED Final   Staphylococcus epidermidis DETECTED (A) NOT DETECTED Final    Comment: Methicillin (oxacillin) resistant coagulase negative staphylococcus. Possible blood culture contaminant (unless isolated from more than one blood culture draw or clinical case suggests pathogenicity). No antibiotic treatment is indicated for blood  culture contaminants. CRITICAL RESULT CALLED TO, READ BACK BY  AND VERIFIED WITH: PHARMD GREG A. [id number] FCP    Staphylococcus lugdunensis NOT DETECTED NOT DETECTED Final   Streptococcus species NOT DETECTED NOT DETECTED Final   Streptococcus agalactiae NOT DETECTED NOT DETECTED Final   Streptococcus pneumoniae NOT DETECTED NOT DETECTED Final   Streptococcus pyogenes NOT DETECTED NOT DETECTED Final   A.calcoaceticus-baumannii NOT DETECTED NOT DETECTED Final   Bacteroides fragilis NOT DETECTED NOT DETECTED Final   Enterobacterales NOT DETECTED NOT DETECTED Final   Enterobacter cloacae complex NOT DETECTED NOT DETECTED Final   Escherichia coli NOT DETECTED NOT DETECTED Final  Klebsiella aerogenes NOT DETECTED NOT DETECTED Final   Klebsiella oxytoca NOT DETECTED NOT DETECTED Final   Klebsiella pneumoniae NOT DETECTED NOT DETECTED Final   Proteus species NOT DETECTED NOT DETECTED Final   Salmonella species NOT DETECTED NOT DETECTED Final   Serratia marcescens NOT DETECTED NOT DETECTED Final   Haemophilus influenzae NOT DETECTED NOT DETECTED Final   Neisseria meningitidis NOT DETECTED NOT DETECTED Final   Pseudomonas aeruginosa NOT DETECTED NOT DETECTED Final   Stenotrophomonas maltophilia NOT DETECTED NOT DETECTED Final   Candida albicans NOT DETECTED NOT DETECTED Final   Candida auris NOT DETECTED NOT DETECTED Final   Candida glabrata NOT DETECTED NOT DETECTED Final   Candida krusei NOT DETECTED NOT DETECTED Final   Candida parapsilosis NOT DETECTED NOT DETECTED Final   Candida tropicalis NOT DETECTED NOT DETECTED Final   Cryptococcus neoformans/gattii NOT DETECTED NOT DETECTED Final   Methicillin resistance mecA/C DETECTED (A) NOT DETECTED Final    Comment: CRITICAL RESULT CALLED TO, READ BACK BY AND VERIFIED WITH: PHARMD GREG A. [id number] FCP Performed at [facility], [address], [place], [place] [address]   Blood Cultures (routine x 2)     Status: None   Collection Time: 01/21/21  9:53 AM   Specimen: BLOOD RIGHT HAND   Result Value Ref Range Status   Specimen Description BLOOD RIGHT HAND  Final   Special Requests   Final    BOTTLES DRAWN AEROBIC ONLY Blood Culture results may not be optimal due to an inadequate volume of blood received in culture bottles   Culture   Final    NO GROWTH 5 DAYS Performed at [facility], [address], [place], [place] [address]    Report Status 01/26/2021 FINAL  Final  MRSA PCR Screening     Status: None   Collection Time: 01/21/21 10:59 PM   Specimen: Nasal Mucosa; Nasopharyngeal  Result Value Ref Range Status   MRSA by PCR NEGATIVE NEGATIVE Final    Comment:        The GeneXpert MRSA Assay (FDA approved for NASAL specimens only), is one component of a comprehensive MRSA colonization surveillance program. It is not intended to diagnose MRSA infection nor to guide or monitor treatment for MRSA infections. Performed at [facility], [address] [place], [place] [address]   SARS CORONAVIRUS 2 (TAT 6-24 HRS) Nasopharyngeal Nasopharyngeal Swab     Status: None   Collection Time: 01/21/21 11:14 PM   Specimen: Nasopharyngeal Swab  Result Value Ref Range Status   SARS Coronavirus 2 NEGATIVE NEGATIVE Final    Comment: (NOTE) SARS-CoV-2 target nucleic acids are NOT DETECTED.  The SARS-CoV-2 RNA is generally detectable in upper and lower respiratory specimens during the acute phase of infection. Negative results do not preclude SARS-CoV-2 infection, do not rule out co-infections with other pathogens, and should not be used as the sole basis for treatment or other patient management decisions. Negative results must be combined with clinical observations, patient history, and epidemiological information. The expected result is Negative.  Fact Sheet for Patients: HairSlick.no  Fact Sheet for Healthcare Providers: quierodirigir.com  This test is not yet approved or cleared by the [place]  FDA and  has been authorized for detection and/or diagnosis of SARS-CoV-2 by FDA under an Emergency Use Authorization (EUA). This EUA will remain  in effect (meaning this test can be used) for the duration of the COVID-19 declaration under Se ction 564(b)(1) of the Act, 21  U.S.C. section 360bbb-3(b)(1), unless the authorization is terminated or revoked sooner.  Performed at [facility], [address] [place], [place] [address]   Culture, blood (routine x 2)     Status: None   Collection Time: 01/22/21 11:14 AM   Specimen: BLOOD LEFT HAND  Result Value Ref Range Status   Specimen Description BLOOD LEFT HAND  Final   Special Requests   Final    BOTTLES DRAWN AEROBIC AND ANAEROBIC Blood Culture results may not be optimal due to an inadequate volume of blood received in culture bottles   Culture   Final    NO GROWTH 5 DAYS Performed at [facility], [address] [place], [place] [address]    Report Status 01/27/2021 FINAL  Final  Culture, blood (routine x 2)     Status: None   Collection Time: 01/22/21 11:16 AM   Specimen: BLOOD LEFT ARM  Result Value Ref Range Status   Specimen Description BLOOD LEFT ARM  Final   Special Requests   Final    BOTTLES DRAWN AEROBIC AND ANAEROBIC Blood Culture results may not be optimal due to an inadequate volume of blood received in culture bottles   Culture   Final    NO GROWTH 5 DAYS Performed at [facility], [address], [place], [place] [address]    Report Status 01/27/2021 FINAL  Final    Radiology Studies: No results found.  Scheduled Meds: . carvedilol  25 mg Oral BID WC  . diltiazem  180 mg Oral BID  . enoxaparin (LOVENOX) injection  40 mg Subcutaneous Q24H  . feeding supplement  1 Container Oral TID BM  . neomycin-bacitracin-polymyxin   Topical BID  . potassium chloride  40 mEq Oral Once  . sodium chloride flush  3 mL Intravenous Q12H   Continuous Infusions:    LOS: 8 days    Time spent:   Zannie Cove, MD [facility]  01/29/2021, 11:06 AM

## 2021-01-29 NOTE — Plan of Care (Signed)

## 2021-01-29 NOTE — Discharge Summary (Incomplete)
Physician Discharge Summary  Michelle Graves ZOX:[id number] DOB: 10/17/1951 DOA: 01/21/2021  PCP: [facility]  Admit date: 01/21/2021 Discharge date: 01/29/2021  Time spent: 35 minutes  Recommendations for Outpatient Follow-up:  1. PCP in 1 week, please review imaging tests and labs from this admission 2. SNF for short-term rehab   Discharge Diagnoses:  Principal Problem:   Acute metabolic encephalopathy Hypoglycemia UTI Mild cognitive deficits Chronic constipation Chronic colonic stricture History of CVA Chronic anemia Hypertension   CKD (chronic kidney disease), stage III (HCC)   Rheumatoid arthritis (HCC)   Anemia, chronic disease   UTI (urinary tract infection)   Hypertensive urgency   Sepsis secondary to UTI [facility])   Discharge Condition: Stable  Diet recommendation: Low-sodium, heart healthy  Filed Weights   01/21/21 0924 01/21/21 2200  Weight: 86.5 kg 86.7 kg    History of present illness:  70 year old female with history of hypertension, CVA in 2028, chronic anemia, rheumatoid arthritis, chronic kidney disease 3B was found unresponsive on the floor and was brought to the ED by EMS 3/28. -Daughter had talked to her at the previous day when she was in her usual state of health, on 3/28 she was found on the floor poorly responsive and brought to the ED. recently hospitalized 2/19-2/23 with acute metabolic encephalopathy) secondary to hypoglycemia, CVA and/or medication she was noted to have a left lateral cerebellar infarct on MRI then. -This time in the ED she was afebrile, hypertensive, with fluctuating O2 sats, hypoglycemic, CT head and cervical spine were unremarkable, labs noted mild leukocytosis, abnormal urinalysis, she was agitated after she woke up and given Ativan and Versed, subsequently became more delirious and was admitted to the ICU on Precedex.  Also treated with ceftriaxone and D10 drip -Did not require mechanical  ventilation, transferred from PCCM to [facility] service  4/1  Hospital Course:     Acute metabolic encephalopathy -Multifactorial, hypoglycemia, UTI, polypharmacy -May also have a component of cognitive deficits with some memory loss at baseline, possible early vascular dementia, this was noted during last hospitalization and rehab stay -Blood sugars improved and stable,, D10 discontinued -Urine culture with staph epidermidis, completed ceftriaxone course -Blood culture notes staph epidermis is in 1 out of 2 sets, significance is unknown, repeat blood cultures are negative -Seroquel discontinued -Mental status improved and stable now -PT OT eval completed, SNF recommended -Discharge to SNF for short-term rehab  Sepsis, UTI -Treated with IV Rocephin x5 days, urine culture with staph epi -Staph epidermidis and hominis, likely contaminant and blood cultures -Repeat blood cultures are negative -Monitor off antibiotics, remains afebrile without leukocytosis  Acute kidney injury Intermittent urinary retention -Baseline creatinine around 1.4-1.5, creatinine was 1.7 on admission -Stable now -Foley catheter discontinued 4/2, urinating without difficulty now  Lower back, tailbone discomfort L5 compression fracture, this was seen on imaging 2 months ago as well -Supportive care, physical therapy -Continue Robaxin as tolerated  Constipation Chronic colonic stricture -Multiple BMs overnight done 2 days ago following enema and laxatives -Continue Colace daily and lactulose as needed  Hypertension -Stable now, continue Coreg and diltiazem, clonidine discontinued  History of rheumatoid arthritis -Resume Plaquenil at discharge  Hypokalemia Hypomagnesemia -Repleted  History of CVA -Continue aspirin, statin -PT eval  Chronic anemia -Hemoglobin relatively stable  Discharge Exam: Vitals:   01/29/21 0729 01/29/21 1113  BP: 139/88 (!) 144/81  Pulse: 87 87  Resp: 18 18  Temp:  99.3 F (37.4 C) 99.3 F (37.4 C)  SpO2: 98%  98%    General: AAOx2, mild cognitive deficits Cardiovascular: S1S2/RRR Respiratory: CTAB  Discharge Instructions    Allergies as of 01/29/2021   No Known Allergies     Medication List    STOP taking these medications   cloNIDine 0.3 MG tablet Commonly known as: CATAPRES   isosorbide mononitrate 30 MG 24 hr tablet Commonly known as: IMDUR   polyethylene glycol 17 g packet Commonly known as: MIRALAX / GLYCOLAX   QUEtiapine 100 MG tablet Commonly known as: SEROQUEL     TAKE these medications   aspirin 81 MG EC tablet Take 1 tablet (81 mg total) by mouth daily.   atorvastatin 40 MG tablet Commonly known as: LIPITOR Take 1 tablet (40 mg total) by mouth daily.   carvedilol 25 MG tablet Commonly known as: COREG Take 1 tablet (25 mg total) by mouth 2 (two) times daily.   diltiazem 180 MG 24 hr capsule Commonly known as: CARDIZEM CD Take 1 capsule (180 mg total) by mouth 2 (two) times daily.   docusate sodium 100 MG capsule Commonly known as: COLACE Take 1 capsule (100 mg total) by mouth 2 (two) times daily.   DULoxetine 60 MG capsule Commonly known as: CYMBALTA Take 1 capsule (60 mg total) by mouth at bedtime.   Ensure Take 237 mLs by mouth daily in the afternoon. Vanilla   hydroxychloroquine 200 MG tablet Commonly known as: PLAQUENIL Take 1 tablet (200 mg total) by mouth daily.   lactulose 10 GM/15ML solution Commonly known as: CHRONULAC Take 15 mLs (10 g total) by mouth daily as needed for mild constipation. What changed:   how much to take  when to take this  reasons to take this   methocarbamol 500 MG tablet Commonly known as: ROBAXIN Take 1 tablet (500 mg total) by mouth every 8 (eight) hours as needed for muscle spasms.   multivitamin with minerals Tabs tablet Take 1 tablet by mouth daily.   pantoprazole 40 MG tablet Commonly known as: PROTONIX Take 1 tablet (40 mg total) by mouth daily.       No Known Allergies  Follow-up Information    [facility]. Schedule an appointment as soon as possible for a visit in 1 week(s).   Specialty: Family Medicine Contact information: [address] [place] [address] [phone]                The results of significant diagnostics from this hospitalization (including imaging, microbiology, ancillary and laboratory) are listed below for reference.    Significant Diagnostic Studies: DG Lumbar Spine 2-3 Views  Result Date: 01/26/2021 CLINICAL DATA:  70 year old female with persistent back pain since falling 1 week previously EXAM: LUMBAR SPINE - 2-3 VIEW COMPARISON:  Prior CT abdomen/pelvis 12/15/2020 FINDINGS: Compression fracture of the L5 vertebral body appears grossly similar compared to prior CT imaging from February 1922. No additional acute fracture or malalignment is identified. Bilateral facet arthropathy present at L3-L4, L4-L5 and L5-S1. No lytic or blastic osseous lesion. The visualized bowel gas pattern is not obstructed. IMPRESSION: 1. Stable compression fracture of L5 compared to prior CT imaging from 12/15/2020. 2. No convincing evidence of new fracture or malalignment. 3. Lower lumbar facet arthropathy. Electronically Signed   By: Malachy Moan M.D.   On: 01/26/2021 13:07   DG Abd 1 View  Result Date: 01/23/2021 CLINICAL DATA:  Abdominal pain. EXAM: ABDOMEN - 1 VIEW COMPARISON:  CT 12/15/2020 FINDINGS: Portable supine view of the  abdomen obtained. Normal bowel gas pattern. No evidence of free air in the supine view. Moderate stool in the ascending and rectosigmoid colon. Rectum mildly distended with stool. No radiopaque calculi or abnormal soft tissue calcifications. Pelvic calcifications correspond to phleboliths on prior exam. No evidence of acute osseous abnormality. IMPRESSION: Moderate stool in the ascending and rectosigmoid colon. No bowel obstruction.  Electronically Signed   By: Narda Rutherfordord M.D.   On: 01/23/2021 17:00   CT HEAD WO CONTRAST  Result Date: 01/21/2021 CLINICAL DATA:  Altered mental status. Additional history provided: Patient found down on floor at home, unknown down time, patient favoring right side, confusion, agitated. EXAM: CT HEAD WITHOUT CONTRAST CT CERVICAL SPINE WITHOUT CONTRAST TECHNIQUE: Multidetector CT imaging of the head and cervical spine was performed following the standard protocol without intravenous contrast. Multiplanar CT image reconstructions of the cervical spine were also generated. COMPARISON:  Brain MRI 12/15/2020. cervical spine radiographs 02/14/2009. FINDINGS: CT HEAD FINDINGS Brain: Mildly motion degraded exam. Mild generalized parenchymal atrophy. Redemonstrated chronic small-vessel infarcts within the right corona radiata and within the deep gray nuclei. Background advanced patchy and ill-defined hypoattenuation within the cerebral white matter is nonspecific, but also compatible with chronic small vessel ischemic disease. Known small chronic infarcts within the bilateral cerebellar hemispheres were better appreciated on the prior brain MRI of 12/15/2020. There is no acute intracranial hemorrhage. No demarcated cortical infarct. No extra-axial fluid collection. No evidence of intracranial mass. No midline shift. Vascular: No hyperdense vessel.  Atherosclerotic calcifications Skull: Normal. Negative for fracture or focal lesion. Sinuses/Orbits: Visualized orbits show no acute finding. No significant paranasal sinus disease at the imaged levels. Other: Mild right anterior scalp soft tissue swelling is questioned. CT CERVICAL SPINE FINDINGS Mildly motion degraded exam. Alignment: Straightening of the expected cervical lordosis. No significant spondylolisthesis. Skull base and vertebrae: The basion-dental and atlanto-dental intervals are maintained.No evidence of acute fracture to the cervical spine. Soft tissues  and spinal canal: No prevertebral fluid or swelling. No visible canal hematoma. Disc levels: Cervical spondylosis with multilevel disc space narrowing, central disc protrusions, disc bulges, uncovertebral hypertrophy and facet arthrosis. Multilevel spinal canal stenosis. Most notably, a disc bulge at C3-C4 contributes to suspected mild/moderate spinal canal stenosis. Multilevel bony neural foraminal narrowing. Upper chest: No consolidation within the imaged lung apices. No visible pneumothorax. Other: Heterogeneous enhancement of the thyroid gland. Additionally, there is asymmetric prominence of the left thyroid lobe. Multiple thyroid nodules. Dominant left thyroid lobe nodule measuring up to 1.6 cm (series 5, image 57). IMPRESSION: CT head: 1. Mildly motion degraded exam. 2. No evidence of acute intracranial abnormality. 3. Mild right anterior scalp soft tissue swelling is questioned. 4. Redemonstrated mild generalized parenchymal atrophy and advanced chronic small vessel ischemic disease. CT cervical spine: 1. Mildly motion degraded exam. 2. No evidence of acute fracture to the cervical spine. 3. Nonspecific straightening of the expected cervical lordosis. 4. Cervical spondylosis, as described. 5. Thyroid nodules, the largest measuring 1.6 cm within the left lobe. Nonemergent thyroid ultrasound is recommended for further evaluation. Electronically Signed   By: Jackey Loge DO   On: 01/21/2021 11:18   CT CERVICAL SPINE WO CONTRAST  Result Date: 01/21/2021 CLINICAL DATA:  Altered mental status. Additional history provided: Patient found down on floor at home, unknown down time, patient favoring right side, confusion, agitated. EXAM: CT HEAD WITHOUT CONTRAST CT CERVICAL SPINE WITHOUT CONTRAST TECHNIQUE: Multidetector CT imaging of the head and cervical spine was performed following the standard protocol without intravenous  contrast. Multiplanar CT image reconstructions of the cervical spine were also generated.  COMPARISON:  Brain MRI 12/15/2020. cervical spine radiographs 02/14/2009. FINDINGS: CT HEAD FINDINGS Brain: Mildly motion degraded exam. Mild generalized parenchymal atrophy. Redemonstrated chronic small-vessel infarcts within the right corona radiata and within the deep gray nuclei. Background advanced patchy and ill-defined hypoattenuation within the cerebral white matter is nonspecific, but also compatible with chronic small vessel ischemic disease. Known small chronic infarcts within the bilateral cerebellar hemispheres were better appreciated on the prior brain MRI of 12/15/2020. There is no acute intracranial hemorrhage. No demarcated cortical infarct. No extra-axial fluid collection. No evidence of intracranial mass. No midline shift. Vascular: No hyperdense vessel.  Atherosclerotic calcifications Skull: Normal. Negative for fracture or focal lesion. Sinuses/Orbits: Visualized orbits show no acute finding. No significant paranasal sinus disease at the imaged levels. Other: Mild right anterior scalp soft tissue swelling is questioned. CT CERVICAL SPINE FINDINGS Mildly motion degraded exam. Alignment: Straightening of the expected cervical lordosis. No significant spondylolisthesis. Skull base and vertebrae: The basion-dental and atlanto-dental intervals are maintained.No evidence of acute fracture to the cervical spine. Soft tissues and spinal canal: No prevertebral fluid or swelling. No visible canal hematoma. Disc levels: Cervical spondylosis with multilevel disc space narrowing, central disc protrusions, disc bulges, uncovertebral hypertrophy and facet arthrosis. Multilevel spinal canal stenosis. Most notably, a disc bulge at C3-C4 contributes to suspected mild/moderate spinal canal stenosis. Multilevel bony neural foraminal narrowing. Upper chest: No consolidation within the imaged lung apices. No visible pneumothorax. Other: Heterogeneous enhancement of the thyroid gland. Additionally, there is asymmetric  prominence of the left thyroid lobe. Multiple thyroid nodules. Dominant left thyroid lobe nodule measuring up to 1.6 cm (series 5, image 57). IMPRESSION: CT head: 1. Mildly motion degraded exam. 2. No evidence of acute intracranial abnormality. 3. Mild right anterior scalp soft tissue swelling is questioned. 4. Redemonstrated mild generalized parenchymal atrophy and advanced chronic small vessel ischemic disease. CT cervical spine: 1. Mildly motion degraded exam. 2. No evidence of acute fracture to the cervical spine. 3. Nonspecific straightening of the expected cervical lordosis. 4. Cervical spondylosis, as described. 5. Thyroid nodules, the largest measuring 1.6 cm within the left lobe. Nonemergent thyroid ultrasound is recommended for further evaluation. Electronically Signed   By: Jackey Loge DO   On: 01/21/2021 11:18   DG Chest Port 1 View  Result Date: 01/21/2021 CLINICAL DATA:  Altered mental status EXAM: PORTABLE CHEST 1 VIEW COMPARISON:  December 15, 2020 FINDINGS: There is slight left base atelectasis. Lungs elsewhere clear. Heart is mildly enlarged with pulmonary vascularity within normal limits. There is prominence of the aorta, a stable finding. No adenopathy. No bone lesions. IMPRESSION: Slight left base atelectasis. Lungs elsewhere clear. Stable cardiac prominence. Prominence of the aorta is a stable finding which likely is due to chronic hypertension. Electronically Signed   By: Bretta Bang III M.D.   On: 01/21/2021 10:57    Microbiology: Recent Results (from the past 240 hour(s))  Blood Cultures (routine x 2)     Status: Abnormal   Collection Time: 01/21/21  9:48 AM   Specimen: BLOOD  Result Value Ref Range Status   Specimen Description BLOOD SITE NOT SPECIFIED  Final   Special Requests   Final    BOTTLES DRAWN AEROBIC AND ANAEROBIC Blood Culture adequate volume   Culture  Setup Time   Final    IN BOTH AEROBIC AND ANAEROBIC BOTTLES GRAM POSITIVE COCCI CRITICAL RESULT CALLED  TO, READ BACK BY AND VERIFIED  WITH: PHARMD GREG A. [id number] FCP    Culture (A)  Final    STAPHYLOCOCCUS HOMINIS STAPHYLOCOCCUS EPIDERMIDIS THE SIGNIFICANCE OF ISOLATING THIS ORGANISM FROM A SINGLE SET OF BLOOD CULTURES WHEN MULTIPLE SETS ARE DRAWN IS UNCERTAIN. PLEASE NOTIFY THE MICROBIOLOGY DEPARTMENT WITHIN ONE WEEK IF SPECIATION AND SENSITIVITIES ARE REQUIRED. Performed at [facility], [address], [place], [place] [address]    Report Status 01/24/2021 FINAL  Final  Urine culture     Status: Abnormal   Collection Time: 01/21/21  9:48 AM   Specimen: Urine, Random  Result Value Ref Range Status   Specimen Description URINE, RANDOM  Final   Special Requests   Final    NONE Performed at [facility], [address], [place], [place] [address]    Culture >=100,000 COLONIES/mL STAPHYLOCOCCUS EPIDERMIDIS (A)  Final   Report Status 01/23/2021 FINAL  Final   Organism ID, Bacteria STAPHYLOCOCCUS EPIDERMIDIS (A)  Final      Susceptibility   Staphylococcus epidermidis - MIC*    CIPROFLOXACIN 2 INTERMEDIATE Intermediate     GENTAMICIN 8 INTERMEDIATE Intermediate     NITROFURANTOIN <=16 SENSITIVE Sensitive     OXACILLIN >=4 RESISTANT Resistant     TETRACYCLINE <=1 SENSITIVE Sensitive     VANCOMYCIN 1 SENSITIVE Sensitive     TRIMETH/SULFA 20 SENSITIVE Sensitive     CLINDAMYCIN <=0.25 SENSITIVE Sensitive     RIFAMPIN <=0.5 SENSITIVE Sensitive     Inducible Clindamycin NEGATIVE Sensitive     * >=100,000 COLONIES/mL STAPHYLOCOCCUS EPIDERMIDIS  Blood Culture ID Panel (Reflexed)     Status: Abnormal   Collection Time: 01/21/21  9:48 AM  Result Value Ref Range Status   Enterococcus faecalis NOT DETECTED NOT DETECTED Final   Enterococcus Faecium NOT DETECTED NOT DETECTED Final   Listeria monocytogenes NOT DETECTED NOT DETECTED Final   Staphylococcus species DETECTED (A) NOT DETECTED Final    Comment: CRITICAL RESULT CALLED TO, READ BACK BY AND VERIFIED WITH: PHARMD GREG A.  [id number] FCP    Staphylococcus aureus (BCID) NOT DETECTED NOT DETECTED Final   Staphylococcus epidermidis DETECTED (A) NOT DETECTED Final    Comment: Methicillin (oxacillin) resistant coagulase negative staphylococcus. Possible blood culture contaminant (unless isolated from more than one blood culture draw or clinical case suggests pathogenicity). No antibiotic treatment is indicated for blood  culture contaminants. CRITICAL RESULT CALLED TO, READ BACK BY AND VERIFIED WITH: PHARMD GREG A. [id number] FCP    Staphylococcus lugdunensis NOT DETECTED NOT DETECTED Final   Streptococcus species NOT DETECTED NOT DETECTED Final   Streptococcus agalactiae NOT DETECTED NOT DETECTED Final   Streptococcus pneumoniae NOT DETECTED NOT DETECTED Final   Streptococcus pyogenes NOT DETECTED NOT DETECTED Final   A.calcoaceticus-baumannii NOT DETECTED NOT DETECTED Final   Bacteroides fragilis NOT DETECTED NOT DETECTED Final   Enterobacterales NOT DETECTED NOT DETECTED Final   Enterobacter cloacae complex NOT DETECTED NOT DETECTED Final   Escherichia coli NOT DETECTED NOT DETECTED Final   Klebsiella aerogenes NOT DETECTED NOT DETECTED Final   Klebsiella oxytoca NOT DETECTED NOT DETECTED Final   Klebsiella pneumoniae NOT DETECTED NOT DETECTED Final   Proteus species NOT DETECTED NOT DETECTED Final   Salmonella species NOT DETECTED NOT DETECTED Final   Serratia marcescens NOT DETECTED NOT DETECTED Final   Haemophilus influenzae NOT DETECTED NOT DETECTED Final   Neisseria meningitidis NOT DETECTED NOT DETECTED Final   Pseudomonas aeruginosa NOT DETECTED NOT DETECTED Final   Stenotrophomonas maltophilia NOT DETECTED  NOT DETECTED Final   Candida albicans NOT DETECTED NOT DETECTED Final   Candida auris NOT DETECTED NOT DETECTED Final   Candida glabrata NOT DETECTED NOT DETECTED Final   Candida krusei NOT DETECTED NOT DETECTED Final   Candida parapsilosis NOT DETECTED NOT DETECTED Final   Candida  tropicalis NOT DETECTED NOT DETECTED Final   Cryptococcus neoformans/gattii NOT DETECTED NOT DETECTED Final   Methicillin resistance mecA/C DETECTED (A) NOT DETECTED Final    Comment: CRITICAL RESULT CALLED TO, READ BACK BY AND VERIFIED WITH: PHARMD GREG A. [id number] FCP Performed at [facility], [address] [place], [place] [address]   Blood Cultures (routine x 2)     Status: None   Collection Time: 01/21/21  9:53 AM   Specimen: BLOOD RIGHT HAND  Result Value Ref Range Status   Specimen Description BLOOD RIGHT HAND  Final   Special Requests   Final    BOTTLES DRAWN AEROBIC ONLY Blood Culture results may not be optimal due to an inadequate volume of blood received in culture bottles   Culture   Final    NO GROWTH 5 DAYS Performed at [facility], [address] [place], [place] [address]    Report Status 01/26/2021 FINAL  Final  MRSA PCR Screening     Status: None   Collection Time: 01/21/21 10:59 PM   Specimen: Nasal Mucosa; Nasopharyngeal  Result Value Ref Range Status   MRSA by PCR NEGATIVE NEGATIVE Final    Comment:        The GeneXpert MRSA Assay (FDA approved for NASAL specimens only), is one component of a comprehensive MRSA colonization surveillance program. It is not intended to diagnose MRSA infection nor to guide or monitor treatment for MRSA infections. Performed at [facility], [address] [place], [place] [address]   SARS CORONAVIRUS 2 (TAT 6-24 HRS) Nasopharyngeal Nasopharyngeal Swab     Status: None   Collection Time: 01/21/21 11:14 PM   Specimen: Nasopharyngeal Swab  Result Value Ref Range Status   SARS Coronavirus 2 NEGATIVE NEGATIVE Final    Comment: (NOTE) SARS-CoV-2 target nucleic acids are NOT DETECTED.  The SARS-CoV-2 RNA is generally detectable in upper and lower respiratory specimens during the acute phase of infection. Negative results do not preclude SARS-CoV-2 infection, do not rule out co-infections with other  pathogens, and should not be used as the sole basis for treatment or other patient management decisions. Negative results must be combined with clinical observations, patient history, and epidemiological information. The expected result is Negative.  Fact Sheet for Patients: HairSlick.no  Fact Sheet for Healthcare Providers: quierodirigir.com  This test is not yet approved or cleared by the [place] FDA and  has been authorized for detection and/or diagnosis of SARS-CoV-2 by FDA under an Emergency Use Authorization (EUA). This EUA will remain  in effect (meaning this test can be used) for the duration of the COVID-19 declaration under Se ction 564(b)(1) of the Act, 21 U.S.C. section 360bbb-3(b)(1), unless the authorization is terminated or revoked sooner.  Performed at [facility], [address], [place], [place] [address]   Culture, blood (routine x 2)     Status: None   Collection Time: 01/22/21 11:14 AM   Specimen: BLOOD LEFT HAND  Result Value Ref Range Status   Specimen Description BLOOD LEFT HAND  Final   Special Requests   Final    BOTTLES DRAWN AEROBIC AND ANAEROBIC Blood Culture results may  not be optimal due to an inadequate volume of blood received in culture bottles   Culture   Final    NO GROWTH 5 DAYS Performed at [facility], [address] [place], [place] [address]    Report Status 01/27/2021 FINAL  Final  Culture, blood (routine x 2)     Status: None   Collection Time: 01/22/21 11:16 AM   Specimen: BLOOD LEFT ARM  Result Value Ref Range Status   Specimen Description BLOOD LEFT ARM  Final   Special Requests   Final    BOTTLES DRAWN AEROBIC AND ANAEROBIC Blood Culture results may not be optimal due to an inadequate volume of blood received in culture bottles   Culture   Final    NO GROWTH 5 DAYS Performed at [facility], [address] [place], [place] [address]    Report  Status 01/27/2021 FINAL  Final     Labs: Basic Metabolic Panel: Recent Labs  Lab 01/24/21 0408 01/25/21 0449 01/26/21 0246 01/27/21 0140 01/28/21 0612 01/29/21 0703  NA 135 134* 134* 137 139 141  K 3.4* 3.5 4.0 3.7 3.9 3.8  CL 105 104 104 106 108 111  CO2 20* 22 20* 25 23 23   GLUCOSE 158* 121* 87 126* 118* 106*  BUN 11 16 22 23 21 21   CREATININE 1.12* 1.42* 1.46* 1.46* 1.17* 1.20*  CALCIUM 9.1 8.5* 8.4* 8.9 9.0 9.0  MG 1.5* 2.2  --   --   --   --   PHOS  --  4.1  --   --   --   --    Liver Function Tests: Recent Labs  Lab 01/23/21 1136 01/29/21 0703  AST 27 13*  ALT 19 15  ALKPHOS 66 62  BILITOT 0.5 0.5  PROT 6.5 5.9*  ALBUMIN 3.4* 2.9*   Recent Labs  Lab 01/23/21 1136  LIPASE 24   No results for input(s): AMMONIA in the last 168 hours. CBC: Recent Labs  Lab 01/25/21 0449 01/26/21 0246 01/27/21 0140 01/28/21 0612 01/29/21 0703  WBC 9.6 8.4 7.3 7.4 9.7  HGB 9.8* 10.0* 10.0* 9.8* 9.3*  HCT 29.0* 30.4* 30.3* 30.3* 28.8*  MCV 92.9 94.1 94.4 94.1 95.7  PLT 187 211 229 226 250   Cardiac Enzymes: No results for input(s): CKTOTAL, CKMB, CKMBINDEX, TROPONINI in the last 168 hours. BNP: BNP (last 3 results) Recent Labs    05/08/20 0215 05/09/20 0407  BNP 215.7* 173.4*    ProBNP (last 3 results) No results for input(s): PROBNP in the last 8760 hours.  CBG: Recent Labs  Lab 01/28/21 1717 01/28/21 2054 01/29/21 0025 01/29/21 0343 01/29/21 1119  GLUCAP 125* 148* 108* 117* 124*       Signed:  Zannie Cove MD.  [facility] 01/29/2021, 2:59 PM

## 2021-01-29 NOTE — Progress Notes (Signed)
Occupational Therapy Treatment Patient Details Name: Michelle Graves MRN: [id number] DOB: May 05, 1951 Today's Date: 01/29/2021    History of present illness 70 y.o. female presenting to ED after an unwitnessed fall at home. Patient admitted with sepsis 2/2 UTI, acute metabolic encephalopathy, and hypoglycemia. Patient with several hospital admissions with most recent 2/19-23 2/2 multifactorial acute metabolic encephalopathy. PMHx significant for HTN, CVA in 2020, RA, and CKD IIIb.   OT comments  Patient continues to make steady progress towards goals in skilled OT session. Patient's session encompassed higher level cognitive tasks, and education with regard to fall prevention and energy conservation. Pt remains alert and oriented to basic questions, but demonstrates moments of tangential speech, selective attention and inappropriate comments ("Imma need a gun for you" and making sexual comments about staff members, RN is aware and has happened before, assumed to be baseline). Pt is currently unable to verbalize fall or safety planning, often changing the subject requiring cues to maintain attention. Pt will continue to benefit from education if plan is eventual discharge home from SNF in order to ensure safety. Therapy will continue to follow.    Follow Up Recommendations  SNF;Supervision/Assistance - 24 hour    Equipment Recommendations  None recommended by OT (Patient already has DME)    Recommendations for Other Services      Precautions / Restrictions Precautions Precautions: Fall Precaution Comments: Incontinent x2 Restrictions Weight Bearing Restrictions: No       Mobility Bed Mobility               General bed mobility comments: up in recliner upon arrival    Transfers                      Balance                                           ADL either performed or assessed with clinical judgement   ADL Overall ADL's : Needs  assistance/impaired Eating/Feeding: Set up                                   Functional mobility during ADLs: Min guard;Rolling walker General ADL Comments: Up in recliner upon arrival, self-limiting, session focus on energy conservation, fall prevention, and cognition     Vision       Perception     Praxis      Cognition Arousal/Alertness: Awake/alert Behavior During Therapy: WFL for tasks assessed/performed (occasional inappropriate comments) Overall Cognitive Status: No family/caregiver present to determine baseline cognitive functioning Area of Impairment: Memory;Following commands;Safety/judgement;Awareness;Problem solving;Attention                   Current Attention Level: Selective Memory: Decreased short-term memory Following Commands: Follows one step commands with increased time;Follows one step commands consistently Safety/Judgement: Decreased awareness of safety;Decreased awareness of deficits Awareness: Intellectual Problem Solving: Slow processing;Requires verbal cues General Comments: Alert and oriented, selective attention noted as well as STM deficits        Exercises     Shoulder Instructions       General Comments      Pertinent Vitals/ Pain       Pain Assessment: Faces Faces Pain Scale: No hurt  Home Living  Prior Functioning/Environment              Frequency  Min 2X/week        Progress Toward Goals  OT Goals(current goals can now be found in the care plan section)  Progress towards OT goals: Progressing toward goals  Acute Rehab OT Goals Patient Stated Goal: To return home. OT Goal Formulation: With patient Time For Goal Achievement: 02/10/21 Potential to Achieve Goals: Good ADL Goals Pt Will Perform Grooming: with supervision;standing Pt Will Perform Lower Body Dressing: with min guard assist;sit to/from stand Pt Will Transfer to Toilet:  with min guard assist;ambulating;regular height toilet Pt Will Perform Toileting - Clothing Manipulation and hygiene: with supervision;sit to/from stand;sitting/lateral leans Additional ADL Goal #1: Pt will demonsatrate selective attention during ADLs with Min cues  Plan Discharge plan remains appropriate    Co-evaluation                 AM-PAC OT "6 Clicks" Daily Activity     Outcome Measure   Help from another person eating meals?: None Help from another person taking care of personal grooming?: A Little Help from another person toileting, which includes using toliet, bedpan, or urinal?: A Lot Help from another person bathing (including washing, rinsing, drying)?: A Lot Help from another person to put on and taking off regular upper body clothing?: A Little Help from another person to put on and taking off regular lower body clothing?: A Lot 6 Click Score: 16    End of Session    OT Visit Diagnosis: Unsteadiness on feet (R26.81);Muscle weakness (generalized) (M62.81);History of falling (Z91.81)   Activity Tolerance Patient tolerated treatment well   Patient Left in chair;with call bell/phone within reach;with chair alarm set   Nurse Communication Mobility status;Other (comment) (occaisonal inappropriate comments)        Time: 6599-3570 OT Time Calculation (min): 16 min  Charges: OT General Charges $OT Visit: 1 Visit OT Treatments $Therapeutic Activity: 8-22 mins  Pollyann Glen E. Algernon Mundie, COTA/L Acute Rehabilitation Services [phone]   Cherlyn Cushing 01/29/2021, 12:38 PM

## 2021-01-29 NOTE — Progress Notes (Signed)
Name: Michelle Graves DOB: 08/13/1951  Please be advised that the above-named patient will require a short-term nursing home stay -- anticipated 30 days or less for rehabilitation and strengthening. The plan is for return home.

## 2021-01-30 DIAGNOSIS — N3000 Acute cystitis without hematuria: Secondary | ICD-10-CM

## 2021-01-30 LAB — GLUCOSE, CAPILLARY
Glucose-Capillary: 109 mg/dL — ABNORMAL HIGH (ref 70–99)
Glucose-Capillary: 104 mg/dL — ABNORMAL HIGH (ref 70–99)
Glucose-Capillary: 116 mg/dL — ABNORMAL HIGH (ref 70–99)

## 2021-01-30 LAB — BASIC METABOLIC PANEL
Anion gap: 6 (ref 5–15)
BUN: 21 mg/dL (ref 8–23)
CO2: 25 mmol/L (ref 22–32)
Calcium: 8.9 mg/dL (ref 8.9–10.3)
Chloride: 108 mmol/L (ref 98–111)
Creatinine, Ser: 1.15 mg/dL — ABNORMAL HIGH (ref 0.44–1.00)
GFR, Estimated: 52 mL/min — ABNORMAL LOW (ref >60)
Glucose, Bld: 105 mg/dL — ABNORMAL HIGH (ref 70–99)
Potassium: 3.7 mmol/L (ref 3.5–5.1)
Sodium: 139 mmol/L (ref 135–145)

## 2021-01-30 LAB — CBC
HCT: 28.4 % — ABNORMAL LOW (ref 36.0–46.0)
Hemoglobin: 9.1 g/dL — ABNORMAL LOW (ref 12.0–15.0)
MCH: 30.4 pg (ref 26.0–34.0)
MCHC: 32 g/dL (ref 30.0–36.0)
MCV: 95 fL (ref 80.0–100.0)
Platelets: 239 10*3/uL (ref 150–400)
RBC: 2.99 MIL/uL — ABNORMAL LOW (ref 3.87–5.11)
RDW: 14.3 % (ref 11.5–15.5)
WBC: 8.5 10*3/uL (ref 4.0–10.5)
nRBC: 0 % (ref 0.0–0.2)

## 2021-01-30 LAB — SARS CORONAVIRUS 2 (TAT 6-24 HRS): SARS Coronavirus 2: NEGATIVE

## 2021-01-30 NOTE — Progress Notes (Signed)
Pt's IV removed; site is clan, dry and intact. Discharge instructions were reviewed with pt; voiced understanding and placed in packet for facility. Report was called and given to Olu, Charity fundraiser at [employer]. Personal belongings were packed and sent with pt. Pt. transported via stretcher by [facility] to receiving facility.

## 2021-01-30 NOTE — Discharge Summary (Signed)
Physician Discharge Summary  Michelle Graves:[id number] DOB: 1951-06-11 DOA: 01/21/2021  PCP: [facility]  Admit date: 01/21/2021 Discharge date: 01/30/2021  Time spent: 35 minutes  Recommendations for Outpatient Follow-up:  1. PCP in 1 week, please review imaging tests and labs from this admission 2. SNF for short-term rehab   Discharge Diagnoses:  Principal Problem:   Acute metabolic encephalopathy Hypoglycemia UTI Mild cognitive deficits Chronic constipation Chronic colonic stricture History of CVA Chronic anemia Hypertension   CKD (chronic kidney disease), stage III (HCC)   Rheumatoid arthritis (HCC)   Anemia, chronic disease   UTI (urinary tract infection)   Hypertensive urgency   Sepsis secondary to UTI [name])   Discharge Condition: Stable  Diet recommendation: Low-sodium, heart healthy  Filed Weights   01/21/21 0924 01/21/21 2200  Weight: 86.5 kg 86.7 kg    History of present illness:  70 year old female with history of hypertension, CVA in 2028, chronic anemia, rheumatoid arthritis, chronic kidney disease 3B was found unresponsive on the floor and was brought to the ED by EMS 3/28. -Daughter had talked to her at the previous day when she was in her usual state of health, on 3/28 she was found on the floor poorly responsive and brought to the ED. recently hospitalized 2/19-2/23 with acute metabolic encephalopathy) secondary to hypoglycemia, CVA and/or medication she was noted to have a left lateral cerebellar infarct on MRI then. -This time in the ED she was afebrile, hypertensive, with fluctuating O2 sats, hypoglycemic, CT head and cervical spine were unremarkable, labs noted mild leukocytosis, abnormal urinalysis, she was agitated after she woke up and given Ativan and Versed, subsequently became more delirious and was admitted to the ICU on Precedex.  Also treated with ceftriaxone and D10 drip -Did not require mechanical  ventilation, transferred from PCCM to [facility] service  4/1  Hospital Course:     Acute metabolic encephalopathy -Multifactorial, hypoglycemia, UTI, polypharmacy -May also have a component of cognitive deficits with some memory loss at baseline, possible early vascular dementia, this was noted during last hospitalization and rehab stay -Blood sugars improved and stable,, D10 discontinued -Urine culture with staph epidermidis, completed ceftriaxone course -Blood culture notes staph epidermis is in 1 out of 2 sets, significance is unknown, repeat blood cultures are negative -Seroquel discontinued -Mental status improved and stable now -PT OT eval completed, SNF recommended -Discharge to SNF for short-term rehab  Sepsis, UTI -Treated with IV Rocephin x5 days, urine culture with staph epi -Staph epidermidis and hominis, likely contaminant and blood cultures -Repeat blood cultures are negative -Monitor off antibiotics, remains afebrile without leukocytosis  Acute kidney injury Intermittent urinary retention -Baseline creatinine around 1.4-1.5, creatinine was 1.7 on admission -Stable now -Foley catheter discontinued 4/2, urinating without difficulty now  Lower back, tailbone discomfort L5 compression fracture, this was seen on imaging 2 months ago as well -Supportive care, physical therapy -Continue Robaxin as tolerated  Constipation Chronic colonic stricture -Multiple BMs overnight done 2 days ago following enema and laxatives -Continue Colace daily and lactulose as needed  Hypertension -Stable now, continue Coreg and diltiazem, clonidine discontinued  History of rheumatoid arthritis -Resume Plaquenil at discharge  Hypokalemia Hypomagnesemia -Repleted  History of CVA -Continue aspirin, statin -PT eval  Chronic anemia -Hemoglobin relatively stable  Discharge Exam: Vitals:   01/30/21 0411 01/30/21 0742  BP: (!) 168/93 (!) 144/90  Pulse: 81 87  Resp: 17 16   Temp: 98.1 F (36.7 C) 99 F (37.2 C)  SpO2:  95% 96%    General: AAOx2, mild cognitive deficits Cardiovascular: S1S2/RRR Respiratory: CTAB  Discharge Instructions    Allergies as of 01/30/2021   No Known Allergies     Medication List    STOP taking these medications   cloNIDine 0.3 MG tablet Commonly known as: CATAPRES   isosorbide mononitrate 30 MG 24 hr tablet Commonly known as: IMDUR   polyethylene glycol 17 g packet Commonly known as: MIRALAX / GLYCOLAX   QUEtiapine 100 MG tablet Commonly known as: SEROQUEL     TAKE these medications   aspirin 81 MG EC tablet Take 1 tablet (81 mg total) by mouth daily.   atorvastatin 40 MG tablet Commonly known as: LIPITOR Take 1 tablet (40 mg total) by mouth daily.   carvedilol 25 MG tablet Commonly known as: COREG Take 1 tablet (25 mg total) by mouth 2 (two) times daily.   diltiazem 180 MG 24 hr capsule Commonly known as: CARDIZEM CD Take 1 capsule (180 mg total) by mouth 2 (two) times daily.   docusate sodium 100 MG capsule Commonly known as: COLACE Take 1 capsule (100 mg total) by mouth 2 (two) times daily.   DULoxetine 60 MG capsule Commonly known as: CYMBALTA Take 1 capsule (60 mg total) by mouth at bedtime.   Ensure Take 237 mLs by mouth daily in the afternoon. Vanilla   hydroxychloroquine 200 MG tablet Commonly known as: PLAQUENIL Take 1 tablet (200 mg total) by mouth daily.   lactulose 10 GM/15ML solution Commonly known as: CHRONULAC Take 15 mLs (10 g total) by mouth daily as needed for mild constipation. What changed:   how much to take  when to take this  reasons to take this   methocarbamol 500 MG tablet Commonly known as: ROBAXIN Take 1 tablet (500 mg total) by mouth every 8 (eight) hours as needed for muscle spasms.   multivitamin with minerals Tabs tablet Take 1 tablet by mouth daily.   pantoprazole 40 MG tablet Commonly known as: PROTONIX Take 1 tablet (40 mg total) by mouth  daily.      No Known Allergies  Follow-up Information    [facility]. Schedule an appointment as soon as possible for a visit in 1 week(s).   Specialty: Family Medicine Contact information: [address] [place] [address] [phone]                The results of significant diagnostics from this hospitalization (including imaging, microbiology, ancillary and laboratory) are listed below for reference.    Significant Diagnostic Studies: DG Lumbar Spine 2-3 Views  Result Date: 01/26/2021 CLINICAL DATA:  70 year old female with persistent back pain since falling 1 week previously EXAM: LUMBAR SPINE - 2-3 VIEW COMPARISON:  Prior CT abdomen/pelvis 12/15/2020 FINDINGS: Compression fracture of the L5 vertebral body appears grossly similar compared to prior CT imaging from February 1922. No additional acute fracture or malalignment is identified. Bilateral facet arthropathy present at L3-L4, L4-L5 and L5-S1. No lytic or blastic osseous lesion. The visualized bowel gas pattern is not obstructed. IMPRESSION: 1. Stable compression fracture of L5 compared to prior CT imaging from 12/15/2020. 2. No convincing evidence of new fracture or malalignment. 3. Lower lumbar facet arthropathy. Electronically Signed   By: Malachy Moan M.D.   On: 01/26/2021 13:07   DG Abd 1 View  Result Date: 01/23/2021 CLINICAL DATA:  Abdominal pain. EXAM: ABDOMEN - 1 VIEW COMPARISON:  CT 12/15/2020 FINDINGS: Portable supine view of  the abdomen obtained. Normal bowel gas pattern. No evidence of free air in the supine view. Moderate stool in the ascending and rectosigmoid colon. Rectum mildly distended with stool. No radiopaque calculi or abnormal soft tissue calcifications. Pelvic calcifications correspond to phleboliths on prior exam. No evidence of acute osseous abnormality. IMPRESSION: Moderate stool in the ascending and rectosigmoid colon. No bowel obstruction.  Electronically Signed   By: Narda Rutherford M.D.   On: 01/23/2021 17:00   CT HEAD WO CONTRAST  Result Date: 01/21/2021 CLINICAL DATA:  Altered mental status. Additional history provided: Patient found down on floor at home, unknown down time, patient favoring right side, confusion, agitated. EXAM: CT HEAD WITHOUT CONTRAST CT CERVICAL SPINE WITHOUT CONTRAST TECHNIQUE: Multidetector CT imaging of the head and cervical spine was performed following the standard protocol without intravenous contrast. Multiplanar CT image reconstructions of the cervical spine were also generated. COMPARISON:  Brain MRI 12/15/2020. cervical spine radiographs 02/14/2009. FINDINGS: CT HEAD FINDINGS Brain: Mildly motion degraded exam. Mild generalized parenchymal atrophy. Redemonstrated chronic small-vessel infarcts within the right corona radiata and within the deep gray nuclei. Background advanced patchy and ill-defined hypoattenuation within the cerebral white matter is nonspecific, but also compatible with chronic small vessel ischemic disease. Known small chronic infarcts within the bilateral cerebellar hemispheres were better appreciated on the prior brain MRI of 12/15/2020. There is no acute intracranial hemorrhage. No demarcated cortical infarct. No extra-axial fluid collection. No evidence of intracranial mass. No midline shift. Vascular: No hyperdense vessel.  Atherosclerotic calcifications Skull: Normal. Negative for fracture or focal lesion. Sinuses/Orbits: Visualized orbits show no acute finding. No significant paranasal sinus disease at the imaged levels. Other: Mild right anterior scalp soft tissue swelling is questioned. CT CERVICAL SPINE FINDINGS Mildly motion degraded exam. Alignment: Straightening of the expected cervical lordosis. No significant spondylolisthesis. Skull base and vertebrae: The basion-dental and atlanto-dental intervals are maintained.No evidence of acute fracture to the cervical spine. Soft tissues  and spinal canal: No prevertebral fluid or swelling. No visible canal hematoma. Disc levels: Cervical spondylosis with multilevel disc space narrowing, central disc protrusions, disc bulges, uncovertebral hypertrophy and facet arthrosis. Multilevel spinal canal stenosis. Most notably, a disc bulge at C3-C4 contributes to suspected mild/moderate spinal canal stenosis. Multilevel bony neural foraminal narrowing. Upper chest: No consolidation within the imaged lung apices. No visible pneumothorax. Other: Heterogeneous enhancement of the thyroid gland. Additionally, there is asymmetric prominence of the left thyroid lobe. Multiple thyroid nodules. Dominant left thyroid lobe nodule measuring up to 1.6 cm (series 5, image 57). IMPRESSION: CT head: 1. Mildly motion degraded exam. 2. No evidence of acute intracranial abnormality. 3. Mild right anterior scalp soft tissue swelling is questioned. 4. Redemonstrated mild generalized parenchymal atrophy and advanced chronic small vessel ischemic disease. CT cervical spine: 1. Mildly motion degraded exam. 2. No evidence of acute fracture to the cervical spine. 3. Nonspecific straightening of the expected cervical lordosis. 4. Cervical spondylosis, as described. 5. Thyroid nodules, the largest measuring 1.6 cm within the left lobe. Nonemergent thyroid ultrasound is recommended for further evaluation. Electronically Signed   By: Jackey Loge DO   On: 01/21/2021 11:18   CT CERVICAL SPINE WO CONTRAST  Result Date: 01/21/2021 CLINICAL DATA:  Altered mental status. Additional history provided: Patient found down on floor at home, unknown down time, patient favoring right side, confusion, agitated. EXAM: CT HEAD WITHOUT CONTRAST CT CERVICAL SPINE WITHOUT CONTRAST TECHNIQUE: Multidetector CT imaging of the head and cervical spine was performed following the standard protocol without  intravenous contrast. Multiplanar CT image reconstructions of the cervical spine were also generated.  COMPARISON:  Brain MRI 12/15/2020. cervical spine radiographs 02/14/2009. FINDINGS: CT HEAD FINDINGS Brain: Mildly motion degraded exam. Mild generalized parenchymal atrophy. Redemonstrated chronic small-vessel infarcts within the right corona radiata and within the deep gray nuclei. Background advanced patchy and ill-defined hypoattenuation within the cerebral white matter is nonspecific, but also compatible with chronic small vessel ischemic disease. Known small chronic infarcts within the bilateral cerebellar hemispheres were better appreciated on the prior brain MRI of 12/15/2020. There is no acute intracranial hemorrhage. No demarcated cortical infarct. No extra-axial fluid collection. No evidence of intracranial mass. No midline shift. Vascular: No hyperdense vessel.  Atherosclerotic calcifications Skull: Normal. Negative for fracture or focal lesion. Sinuses/Orbits: Visualized orbits show no acute finding. No significant paranasal sinus disease at the imaged levels. Other: Mild right anterior scalp soft tissue swelling is questioned. CT CERVICAL SPINE FINDINGS Mildly motion degraded exam. Alignment: Straightening of the expected cervical lordosis. No significant spondylolisthesis. Skull base and vertebrae: The basion-dental and atlanto-dental intervals are maintained.No evidence of acute fracture to the cervical spine. Soft tissues and spinal canal: No prevertebral fluid or swelling. No visible canal hematoma. Disc levels: Cervical spondylosis with multilevel disc space narrowing, central disc protrusions, disc bulges, uncovertebral hypertrophy and facet arthrosis. Multilevel spinal canal stenosis. Most notably, a disc bulge at C3-C4 contributes to suspected mild/moderate spinal canal stenosis. Multilevel bony neural foraminal narrowing. Upper chest: No consolidation within the imaged lung apices. No visible pneumothorax. Other: Heterogeneous enhancement of the thyroid gland. Additionally, there is asymmetric  prominence of the left thyroid lobe. Multiple thyroid nodules. Dominant left thyroid lobe nodule measuring up to 1.6 cm (series 5, image 57). IMPRESSION: CT head: 1. Mildly motion degraded exam. 2. No evidence of acute intracranial abnormality. 3. Mild right anterior scalp soft tissue swelling is questioned. 4. Redemonstrated mild generalized parenchymal atrophy and advanced chronic small vessel ischemic disease. CT cervical spine: 1. Mildly motion degraded exam. 2. No evidence of acute fracture to the cervical spine. 3. Nonspecific straightening of the expected cervical lordosis. 4. Cervical spondylosis, as described. 5. Thyroid nodules, the largest measuring 1.6 cm within the left lobe. Nonemergent thyroid ultrasound is recommended for further evaluation. Electronically Signed   By: Jackey Loge DO   On: 01/21/2021 11:18   DG Chest Port 1 View  Result Date: 01/21/2021 CLINICAL DATA:  Altered mental status EXAM: PORTABLE CHEST 1 VIEW COMPARISON:  December 15, 2020 FINDINGS: There is slight left base atelectasis. Lungs elsewhere clear. Heart is mildly enlarged with pulmonary vascularity within normal limits. There is prominence of the aorta, a stable finding. No adenopathy. No bone lesions. IMPRESSION: Slight left base atelectasis. Lungs elsewhere clear. Stable cardiac prominence. Prominence of the aorta is a stable finding which likely is due to chronic hypertension. Electronically Signed   By: Bretta Bang III M.D.   On: 01/21/2021 10:57    Microbiology: Recent Results (from the past 240 hour(s))  Blood Cultures (routine x 2)     Status: Abnormal   Collection Time: 01/21/21  9:48 AM   Specimen: BLOOD  Result Value Ref Range Status   Specimen Description BLOOD SITE NOT SPECIFIED  Final   Special Requests   Final    BOTTLES DRAWN AEROBIC AND ANAEROBIC Blood Culture adequate volume   Culture  Setup Time   Final    IN BOTH AEROBIC AND ANAEROBIC BOTTLES GRAM POSITIVE COCCI CRITICAL RESULT CALLED  TO, READ BACK BY AND  VERIFIED WITH: PHARMD GREG A. [id number] FCP    Culture (A)  Final    STAPHYLOCOCCUS HOMINIS STAPHYLOCOCCUS EPIDERMIDIS THE SIGNIFICANCE OF ISOLATING THIS ORGANISM FROM A SINGLE SET OF BLOOD CULTURES WHEN MULTIPLE SETS ARE DRAWN IS UNCERTAIN. PLEASE NOTIFY THE MICROBIOLOGY DEPARTMENT WITHIN ONE WEEK IF SPECIATION AND SENSITIVITIES ARE REQUIRED. Performed at [facility], [address] [place], [place] [address]    Report Status 01/24/2021 FINAL  Final  Urine culture     Status: Abnormal   Collection Time: 01/21/21  9:48 AM   Specimen: Urine, Random  Result Value Ref Range Status   Specimen Description URINE, RANDOM  Final   Special Requests   Final    NONE Performed at [facility], [address] [place], [place] [address]    Culture >=100,000 COLONIES/mL STAPHYLOCOCCUS EPIDERMIDIS (A)  Final   Report Status 01/23/2021 FINAL  Final   Organism ID, Bacteria STAPHYLOCOCCUS EPIDERMIDIS (A)  Final      Susceptibility   Staphylococcus epidermidis - MIC*    CIPROFLOXACIN 2 INTERMEDIATE Intermediate     GENTAMICIN 8 INTERMEDIATE Intermediate     NITROFURANTOIN <=16 SENSITIVE Sensitive     OXACILLIN >=4 RESISTANT Resistant     TETRACYCLINE <=1 SENSITIVE Sensitive     VANCOMYCIN 1 SENSITIVE Sensitive     TRIMETH/SULFA 20 SENSITIVE Sensitive     CLINDAMYCIN <=0.25 SENSITIVE Sensitive     RIFAMPIN <=0.5 SENSITIVE Sensitive     Inducible Clindamycin NEGATIVE Sensitive     * >=100,000 COLONIES/mL STAPHYLOCOCCUS EPIDERMIDIS  Blood Culture ID Panel (Reflexed)     Status: Abnormal   Collection Time: 01/21/21  9:48 AM  Result Value Ref Range Status   Enterococcus faecalis NOT DETECTED NOT DETECTED Final   Enterococcus Faecium NOT DETECTED NOT DETECTED Final   Listeria monocytogenes NOT DETECTED NOT DETECTED Final   Staphylococcus species DETECTED (A) NOT DETECTED Final    Comment: CRITICAL RESULT CALLED TO, READ BACK BY AND VERIFIED WITH: PHARMD GREG A.  [id number] FCP    Staphylococcus aureus (BCID) NOT DETECTED NOT DETECTED Final   Staphylococcus epidermidis DETECTED (A) NOT DETECTED Final    Comment: Methicillin (oxacillin) resistant coagulase negative staphylococcus. Possible blood culture contaminant (unless isolated from more than one blood culture draw or clinical case suggests pathogenicity). No antibiotic treatment is indicated for blood  culture contaminants. CRITICAL RESULT CALLED TO, READ BACK BY AND VERIFIED WITH: PHARMD GREG A. [id number] FCP    Staphylococcus lugdunensis NOT DETECTED NOT DETECTED Final   Streptococcus species NOT DETECTED NOT DETECTED Final   Streptococcus agalactiae NOT DETECTED NOT DETECTED Final   Streptococcus pneumoniae NOT DETECTED NOT DETECTED Final   Streptococcus pyogenes NOT DETECTED NOT DETECTED Final   A.calcoaceticus-baumannii NOT DETECTED NOT DETECTED Final   Bacteroides fragilis NOT DETECTED NOT DETECTED Final   Enterobacterales NOT DETECTED NOT DETECTED Final   Enterobacter cloacae complex NOT DETECTED NOT DETECTED Final   Escherichia coli NOT DETECTED NOT DETECTED Final   Klebsiella aerogenes NOT DETECTED NOT DETECTED Final   Klebsiella oxytoca NOT DETECTED NOT DETECTED Final   Klebsiella pneumoniae NOT DETECTED NOT DETECTED Final   Proteus species NOT DETECTED NOT DETECTED Final   Salmonella species NOT DETECTED NOT DETECTED Final   Serratia marcescens NOT DETECTED NOT DETECTED Final   Haemophilus influenzae NOT DETECTED NOT DETECTED Final   Neisseria meningitidis NOT DETECTED NOT DETECTED Final   Pseudomonas aeruginosa NOT DETECTED NOT DETECTED Final   Stenotrophomonas maltophilia NOT  DETECTED NOT DETECTED Final   Candida albicans NOT DETECTED NOT DETECTED Final   Candida auris NOT DETECTED NOT DETECTED Final   Candida glabrata NOT DETECTED NOT DETECTED Final   Candida krusei NOT DETECTED NOT DETECTED Final   Candida parapsilosis NOT DETECTED NOT DETECTED Final   Candida  tropicalis NOT DETECTED NOT DETECTED Final   Cryptococcus neoformans/gattii NOT DETECTED NOT DETECTED Final   Methicillin resistance mecA/C DETECTED (A) NOT DETECTED Final    Comment: CRITICAL RESULT CALLED TO, READ BACK BY AND VERIFIED WITH: PHARMD GREG A. [id number] FCP Performed at [facility], [address] [place], [place] [address]   Blood Cultures (routine x 2)     Status: None   Collection Time: 01/21/21  9:53 AM   Specimen: BLOOD RIGHT HAND  Result Value Ref Range Status   Specimen Description BLOOD RIGHT HAND  Final   Special Requests   Final    BOTTLES DRAWN AEROBIC ONLY Blood Culture results may not be optimal due to an inadequate volume of blood received in culture bottles   Culture   Final    NO GROWTH 5 DAYS Performed at [facility], [address], [place], [place] [address]    Report Status 01/26/2021 FINAL  Final  MRSA PCR Screening     Status: None   Collection Time: 01/21/21 10:59 PM   Specimen: Nasal Mucosa; Nasopharyngeal  Result Value Ref Range Status   MRSA by PCR NEGATIVE NEGATIVE Final    Comment:        The GeneXpert MRSA Assay (FDA approved for NASAL specimens only), is one component of a comprehensive MRSA colonization surveillance program. It is not intended to diagnose MRSA infection nor to guide or monitor treatment for MRSA infections. Performed at [facility], [address], [place], [place] [address]   SARS CORONAVIRUS 2 (TAT 6-24 HRS) Nasopharyngeal Nasopharyngeal Swab     Status: None   Collection Time: 01/21/21 11:14 PM   Specimen: Nasopharyngeal Swab  Result Value Ref Range Status   SARS Coronavirus 2 NEGATIVE NEGATIVE Final    Comment: (NOTE) SARS-CoV-2 target nucleic acids are NOT DETECTED.  The SARS-CoV-2 RNA is generally detectable in upper and lower respiratory specimens during the acute phase of infection. Negative results do not preclude SARS-CoV-2 infection, do not rule out co-infections with other  pathogens, and should not be used as the sole basis for treatment or other patient management decisions. Negative results must be combined with clinical observations, patient history, and epidemiological information. The expected result is Negative.  Fact Sheet for Patients: HairSlick.no  Fact Sheet for Healthcare Providers: quierodirigir.com  This test is not yet approved or cleared by the [place] FDA and  has been authorized for detection and/or diagnosis of SARS-CoV-2 by FDA under an Emergency Use Authorization (EUA). This EUA will remain  in effect (meaning this test can be used) for the duration of the COVID-19 declaration under Se ction 564(b)(1) of the Act, 21 U.S.C. section 360bbb-3(b)(1), unless the authorization is terminated or revoked sooner.  Performed at [facility], [address] [place], [place] [address]   Culture, blood (routine x 2)     Status: None   Collection Time: 01/22/21 11:14 AM   Specimen: BLOOD LEFT HAND  Result Value Ref Range Status   Specimen Description BLOOD LEFT HAND  Final   Special Requests   Final    BOTTLES DRAWN AEROBIC AND ANAEROBIC Blood Culture results  may not be optimal due to an inadequate volume of blood received in culture bottles   Culture   Final    NO GROWTH 5 DAYS Performed at [name], [address] [place], [place] [address]    Report Status 01/27/2021 FINAL  Final  Culture, blood (routine x 2)     Status: None   Collection Time: 01/22/21 11:16 AM   Specimen: BLOOD LEFT ARM  Result Value Ref Range Status   Specimen Description BLOOD LEFT ARM  Final   Special Requests   Final    BOTTLES DRAWN AEROBIC AND ANAEROBIC Blood Culture results may not be optimal due to an inadequate volume of blood received in culture bottles   Culture   Final    NO GROWTH 5 DAYS Performed at [facility], [address] [place], [place] [address]    Report  Status 01/27/2021 FINAL  Final  SARS CORONAVIRUS 2 (TAT 6-24 HRS) Nasopharyngeal Nasopharyngeal Swab     Status: None   Collection Time: 01/29/21 11:35 PM   Specimen: Nasopharyngeal Swab  Result Value Ref Range Status   SARS Coronavirus 2 NEGATIVE NEGATIVE Final    Comment: (NOTE) SARS-CoV-2 target nucleic acids are NOT DETECTED.  The SARS-CoV-2 RNA is generally detectable in upper and lower respiratory specimens during the acute phase of infection. Negative results do not preclude SARS-CoV-2 infection, do not rule out co-infections with other pathogens, and should not be used as the sole basis for treatment or other patient management decisions. Negative results must be combined with clinical observations, patient history, and epidemiological information. The expected result is Negative.  Fact Sheet for Patients: HairSlick.no  Fact Sheet for Healthcare Providers: quierodirigir.com  This test is not yet approved or cleared by the [place] FDA and  has been authorized for detection and/or diagnosis of SARS-CoV-2 by FDA under an Emergency Use Authorization (EUA). This EUA will remain  in effect (meaning this test can be used) for the duration of the COVID-19 declaration under Se ction 564(b)(1) of the Act, 21 U.S.C. section 360bbb-3(b)(1), unless the authorization is terminated or revoked sooner.  Performed at [facility], [address], [place], [place] [address]      Labs: Basic Metabolic Panel: Recent Labs  Lab 01/24/21 0408 01/25/21 0449 01/26/21 0246 01/27/21 0140 01/28/21 0612 01/29/21 0703 01/30/21 0354  NA 135 134* 134* 137 139 141 139  K 3.4* 3.5 4.0 3.7 3.9 3.8 3.7  CL 105 104 104 106 108 111 108  CO2 20* 22 20* 25 23 23 25   GLUCOSE 158* 121* 87 126* 118* 106* 105*  BUN 11 16 22 23 21 21 21   CREATININE 1.12* 1.42* 1.46* 1.46* 1.17* 1.20* 1.15*  CALCIUM 9.1 8.5* 8.4* 8.9 9.0 9.0 8.9  MG  1.5* 2.2  --   --   --   --   --   PHOS  --  4.1  --   --   --   --   --    Liver Function Tests: Recent Labs  Lab 01/23/21 1136 01/29/21 0703  AST 27 13*  ALT 19 15  ALKPHOS 66 62  BILITOT 0.5 0.5  PROT 6.5 5.9*  ALBUMIN 3.4* 2.9*   Recent Labs  Lab 01/23/21 1136  LIPASE 24   No results for input(s): AMMONIA in the last 168 hours. CBC: Recent Labs  Lab 01/26/21 0246 01/27/21 0140 01/28/21 0612 01/29/21 0703 01/30/21 0354  WBC 8.4 7.3 7.4  9.7 8.5  HGB 10.0* 10.0* 9.8* 9.3* 9.1*  HCT 30.4* 30.3* 30.3* 28.8* 28.4*  MCV 94.1 94.4 94.1 95.7 95.0  PLT 211 229 226 250 239   Cardiac Enzymes: No results for input(s): CKTOTAL, CKMB, CKMBINDEX, TROPONINI in the last 168 hours. BNP: BNP (last 3 results) Recent Labs    05/08/20 0215 05/09/20 0407  BNP 215.7* 173.4*    ProBNP (last 3 results) No results for input(s): PROBNP in the last 8760 hours.  CBG: Recent Labs  Lab 01/29/21 1605 01/29/21 2023 01/29/21 2337 01/30/21 0410 01/30/21 0726  GLUCAP 117* 104* 99 109* 104*       Signed:  Kathlen Mody MD.  [facility] 01/30/2021, 8:05 AM

## 2021-01-30 NOTE — Progress Notes (Signed)
Patient will go to [facility] via [place] - [facility] has been called for pick up at 10:30am. The number to call for report is [phone].  RN and patient's daughter aware of discharge plan - discharge packet completed and is at the unit nursing station.  Edwin Dada, MSW, LCSW Transitions of Care  Clinical Social Worker II [phone] fl2

## 2021-01-31 ENCOUNTER — Other Ambulatory Visit: Payer: Self-pay | Admitting: Orthopedic Surgery

## 2021-01-31 DIAGNOSIS — M05749 Rheumatoid arthritis with rheumatoid factor of unspecified hand without organ or systems involvement: Secondary | ICD-10-CM

## 2021-02-09 ENCOUNTER — Other Ambulatory Visit: Payer: Self-pay | Admitting: Orthopedic Surgery

## 2021-02-09 DIAGNOSIS — N2889 Other specified disorders of kidney and ureter: Secondary | ICD-10-CM

## 2021-02-09 DIAGNOSIS — R3911 Hesitancy of micturition: Secondary | ICD-10-CM

## 2021-02-09 DIAGNOSIS — I4891 Unspecified atrial fibrillation: Secondary | ICD-10-CM

## 2021-02-09 DIAGNOSIS — I151 Hypertension secondary to other renal disorders: Secondary | ICD-10-CM

## 2021-02-21 ENCOUNTER — Other Ambulatory Visit: Payer: Self-pay

## 2021-02-21 ENCOUNTER — Encounter: Payer: Self-pay | Admitting: Endocrinology

## 2021-02-21 ENCOUNTER — Ambulatory Visit (INDEPENDENT_AMBULATORY_CARE_PROVIDER_SITE_OTHER): Payer: Medicare Other | Admitting: Endocrinology

## 2021-02-21 VITALS — BP 148/98 | HR 84 | Ht 67.0 in | Wt 184.2 lb

## 2021-02-21 DIAGNOSIS — E2740 Unspecified adrenocortical insufficiency: Secondary | ICD-10-CM

## 2021-02-21 LAB — CORTISOL
Cortisol, Plasma: 8.9 ug/dL
Cortisol, Plasma: 24.9 ug/dL

## 2021-02-21 MED ORDER — COSYNTROPIN 0.25 MG IJ SOLR
0.2500 mg | Freq: Once | INTRAMUSCULAR | Status: AC
  Administered 2021-02-21: 0.25 mg via INTRAVENOUS

## 2021-02-21 NOTE — Progress Notes (Signed)
Subjective:    Patient ID: Michelle Graves, female    DOB: 05-Feb-1951, 70 y.o.   MRN: [id number]  HPI Pt is referred by Dr Jerral Ralph, for possible adrenal insuff.  Pt was noted to have low cortisol level in 2/22.  no h/o abdominal or brain injury.  No h/o cancer, thyroid problems, seizures, amyloidosis, tuberculosis, or diabetes.  No recent steroids.  No h/o ketoconazole, rifampin, or dilantin.  Pt reports pain at both thigh areas, and fatigue.   Past Medical History:  Diagnosis Date  . Acute on chronic kidney failure (HCC)   . Anemia   . Arthritis   . Chest pain   . Diverticulitis   . Hypertension   . Stroke (HCC)   . UTI (urinary tract infection) 10/2019    Past Surgical History:  Procedure Laterality Date  . BIOPSY  04/01/2019   Procedure: BIOPSY;  Surgeon: Kathi Der, MD;  Location: [facility] ENDOSCOPY;  Service: Gastroenterology;;  . BIOPSY  10/05/2019   Procedure: BIOPSY;  Surgeon: Kathi Der, MD;  Location: [facility] ENDOSCOPY;  Service: Gastroenterology;;  . BIOPSY  05/10/2020   Procedure: BIOPSY;  Surgeon: Kathi Der, MD;  Location: [facility] ENDOSCOPY;  Service: Gastroenterology;;  . BIOPSY  11/29/2020   Procedure: BIOPSY;  Surgeon: Charlott Rakes, MD;  Location: [facility] ENDOSCOPY;  Service: Gastroenterology;;  . COLONOSCOPY N/A 11/29/2020   Procedure: COLONOSCOPY;  Surgeon: Charlott Rakes, MD;  Location: [facility] ENDOSCOPY;  Service: Gastroenterology;  Laterality: N/A;  . COLONOSCOPY WITH PROPOFOL N/A 04/01/2019   Procedure: COLONOSCOPY WITH PROPOFOL;  Surgeon: Kathi Der, MD;  Location: [facility] ENDOSCOPY;  Service: Gastroenterology;  Laterality: N/A;  . COLONOSCOPY WITH PROPOFOL N/A 10/05/2019   Procedure: COLONOSCOPY WITH PROPOFOL;  Surgeon: Kathi Der, MD;  Location: [facility] ENDOSCOPY;  Service: Gastroenterology;  Laterality: N/A;  . COLONOSCOPY WITH PROPOFOL N/A 05/10/2020   Procedure: COLONOSCOPY WITH PROPOFOL;  Surgeon: Kathi Der, MD;  Location: [facility] ENDOSCOPY;  Service:  Gastroenterology;  Laterality: N/A;  . ESOPHAGOGASTRODUODENOSCOPY (EGD) WITH PROPOFOL N/A 04/01/2019   Procedure: ESOPHAGOGASTRODUODENOSCOPY (EGD) WITH PROPOFOL;  Surgeon: Kathi Der, MD;  Location: [facility] ENDOSCOPY;  Service: Gastroenterology;  Laterality: N/A;  . ESOPHAGOGASTRODUODENOSCOPY (EGD) WITH PROPOFOL N/A 10/05/2019   Procedure: ESOPHAGOGASTRODUODENOSCOPY (EGD) WITH PROPOFOL;  Surgeon: Kathi Der, MD;  Location: [facility] ENDOSCOPY;  Service: Gastroenterology;  Laterality: N/A;  . POLYPECTOMY  11/29/2020   Procedure: POLYPECTOMY;  Surgeon: Charlott Rakes, MD;  Location: [facility] ENDOSCOPY;  Service: Gastroenterology;;  . Sunnie Nielsen TATTOO INJECTION  11/29/2020   Procedure: SUBMUCOSAL TATTOO INJECTION;  Surgeon: Charlott Rakes, MD;  Location: [facility] ENDOSCOPY;  Service: Gastroenterology;;  . TONSILLECTOMY      Social History   Socioeconomic History  . Marital status: Divorced    Spouse name: Not on file  . Number of children: Not on file  . Years of education: Not on file  . Highest education level: Not on file  Occupational History  . Not on file  Tobacco Use  . Smoking status: Never Smoker  . Smokeless tobacco: Never Used  Vaping Use  . Vaping Use: Never used  Substance and Sexual Activity  . Alcohol use: Not Currently    Alcohol/week: 0.0 standard drinks    Comment: sometimes   . Drug use: No  . Sexual activity: Yes    Partners: Male    Birth control/protection: Condom  Other Topics Concern  . Not on file  Social History Narrative  . Not on file   Social Determinants of Health   Financial Resource Strain:  Not on file  Food Insecurity: Not on file  Transportation Needs: Not on file  Physical Activity: Not on file  Stress: Not on file  Social Connections: Not on file  Intimate Partner Violence: Not on file    Current Outpatient Medications on File Prior to Visit  Medication Sig Dispense Refill  . aspirin 81 MG EC tablet Take 1 tablet (81 mg total) by mouth  daily. 30 tablet 0  . atorvastatin (LIPITOR) 40 MG tablet Take 1 tablet (40 mg total) by mouth daily. 30 tablet 0  . carvedilol (COREG) 25 MG tablet Take 1 tablet (25 mg total) by mouth 2 (two) times daily. 60 tablet 0  . diltiazem (CARDIZEM CD) 180 MG 24 hr capsule Take 1 capsule (180 mg total) by mouth 2 (two) times daily. 60 capsule 0  . docusate sodium (COLACE) 100 MG capsule Take 1 capsule (100 mg total) by mouth 2 (two) times daily. 30 capsule 0  . DULoxetine (CYMBALTA) 60 MG capsule Take 1 capsule (60 mg total) by mouth at bedtime. 30 capsule 0  . Ensure (ENSURE) Take 237 mLs by mouth daily in the afternoon. Vanilla    . hydroxychloroquine (PLAQUENIL) 200 MG tablet Take 1 tablet (200 mg total) by mouth daily. 30 tablet 0  . lactulose (CHRONULAC) 10 GM/15ML solution Take 15 mLs (10 g total) by mouth daily as needed for mild constipation. 1892 mL 0  . methocarbamol (ROBAXIN) 500 MG tablet Take 1 tablet (500 mg total) by mouth every 8 (eight) hours as needed for muscle spasms.    . Multiple Vitamin (MULTIVITAMIN WITH MINERALS) TABS tablet Take 1 tablet by mouth daily. 30 tablet 2  . pantoprazole (PROTONIX) 40 MG tablet Take 1 tablet (40 mg total) by mouth daily. 30 tablet 0   No current facility-administered medications on file prior to visit.    No Known Allergies  Family History  Problem Relation Age of Onset  . Hypertension Mother   . Diabetes Mother   . CAD Father        died of MI at age 97  . Hypertension Father   . Diabetes Sister   . Diabetes Sister   . Kidney disease Neg Hx   . Adrenal disorder Neg Hx     BP (!) 148/98   Pulse 84   Ht 5\' 7"  (1.702 m)   Wt 184 lb 3.2 oz (83.6 kg)   SpO2 95%   BMI 28.85 kg/m    Review of Systems Denies weight loss, dizziness, n/v, abd pain, cold intolerance, vitiligo, and change in skin tone.      Objective:   Physical Exam VS: see vs page GEN: no distress HEAD: head: no deformity eyes: no periorbital swelling, no  proptosis external nose and ears are normal NECK: 1-2 cm left thyroid nodule is noted CHEST WALL: no deformity LUNGS: clear to auscultation CV: reg rate and rhythm, no murmur.  MUSCULOSKELETAL: gait is normal and steady EXTEMITIES: no deformity.  no leg edema NEURO:  readily moves all 4's.  sensation is intact to touch on all 4's.  SKIN:  Normal texture and temperature.  No rash or suspicious lesion is visible.   NODES:  None palpable at the neck.   PSYCH: alert, well-oriented.  Does not appear anxious nor depressed.    Lab Results  Component Value Date   TSH 1.692 01/21/2021    2/22: ACTH stimulation test with Cortisol 6.3 30 minutes post ACTH 16.6 60 minutes postACTH19.7  CT (  2022): adrenals are normal.  No mention is made of the pituitary.    I have reviewed outside records, and summarized: Pt was noted to have low cortisol, and referred here.  She was noted to have hypoglycemia in the hospital, despite being on no med that would cause it.  ACTH stimulation test is done: baseline cortisol level=9 then Cosyntropin 250 mcg is given im 45 minutes later, cortisol level=25 (normal response)     Assessment & Plan:  Hypocortisolemia. Adrenal insuff is excluded. MNG, new to me, uncertain etiology and prognosis: I advised [place]  Patient Instructions  Blood tests are requested for you today.  We'll let you know about the results.

## 2021-02-21 NOTE — Patient Instructions (Signed)
Blood tests are requested for you today.  We'll let you know about the results.  

## 2021-02-21 NOTE — Progress Notes (Signed)
Pt was given a Cosyntropin injection in the Lt deltoid. Administered @ 2:10

## 2021-02-25 ENCOUNTER — Other Ambulatory Visit: Payer: Self-pay | Admitting: Endocrinology

## 2021-02-25 ENCOUNTER — Telehealth: Payer: Self-pay | Admitting: Endocrinology

## 2021-02-25 DIAGNOSIS — E042 Nontoxic multinodular goiter: Secondary | ICD-10-CM

## 2021-02-25 NOTE — Telephone Encounter (Signed)
Ok.  you will receive a phone call, about a day and time for an appointment 

## 2021-02-25 NOTE — Telephone Encounter (Signed)
Message sent thru yChart

## 2021-02-25 NOTE — Telephone Encounter (Signed)
Patient's daughter called stating yes, if Dr Everardo All is recommending to have an Ultrasound done then that is what they would like to do. FYI

## 2021-03-04 ENCOUNTER — Ambulatory Visit
Admission: RE | Admit: 2021-03-04 | Discharge: 2021-03-04 | Disposition: A | Discharge status: Home / Self Care | Payer: Medicare Other | Source: Ambulatory Visit | Attending: Endocrinology | Admitting: Endocrinology

## 2021-03-04 ENCOUNTER — Other Ambulatory Visit: Payer: Self-pay | Admitting: Endocrinology

## 2021-03-04 DIAGNOSIS — E042 Nontoxic multinodular goiter: Secondary | ICD-10-CM

## 2021-03-08 ENCOUNTER — Ambulatory Visit
Admission: RE | Admit: 2021-03-08 | Discharge: 2021-03-08 | Disposition: A | Discharge status: Home / Self Care | Payer: Medicare Other | Source: Ambulatory Visit | Attending: Endocrinology | Admitting: Endocrinology

## 2021-03-08 ENCOUNTER — Other Ambulatory Visit: Payer: Self-pay

## 2021-03-08 ENCOUNTER — Other Ambulatory Visit (HOSPITAL_COMMUNITY)
Admission: RE | Admit: 2021-03-08 | Discharge: 2021-03-08 | Disposition: A | Discharge status: Home / Self Care | Payer: Medicare Other | Source: Ambulatory Visit | Attending: Endocrinology | Admitting: Endocrinology

## 2021-03-08 DIAGNOSIS — E042 Nontoxic multinodular goiter: Secondary | ICD-10-CM | POA: Insufficient documentation

## 2021-03-11 LAB — CYTOLOGY - NON PAP

## 2021-03-12 ENCOUNTER — Ambulatory Visit: Payer: Medicare Other | Admitting: Neurology

## 2021-03-12 ENCOUNTER — Other Ambulatory Visit: Payer: Self-pay

## 2021-03-12 ENCOUNTER — Encounter: Payer: Self-pay | Admitting: Neurology

## 2021-03-12 VITALS — BP 148/98 | HR 84 | Ht 66.5 in | Wt 183.0 lb

## 2021-03-12 DIAGNOSIS — I639 Cerebral infarction, unspecified: Secondary | ICD-10-CM | POA: Diagnosis not present

## 2021-03-12 DIAGNOSIS — I6300 Cerebral infarction due to thrombosis of unspecified precerebral artery: Secondary | ICD-10-CM

## 2021-03-12 MED ORDER — ATORVASTATIN CALCIUM 40 MG PO TABS: 60.0000 mg | ORAL_TABLET | Freq: Every day | ORAL | 3 refills | Status: DC

## 2021-03-12 MED ORDER — CLOPIDOGREL BISULFATE 75 MG PO TABS: 75.0000 mg | ORAL_TABLET | Freq: Every day | ORAL | 11 refills | Status: AC

## 2021-03-12 NOTE — Progress Notes (Signed)
[facility] [address] [place]. [place] [address] [phone]       OFFICE CONSULT NOTE  [name] Date of Birth:  Mar 14, 1951 Medical Record Number:  [id number]   Referring MD: Dorcas Carrow  Reason for Referral: Stroke HPI: Michelle Graves is a 70 year old pleasant African-American lady seen today for initial office consultation visit for stroke.  She is accompanied by her son.  History is obtained from them and review of electronic medical records and I personally reviewed pertinent available imaging films in PACS.  She has past medical history of hypertension, chronic kidney disease, rheumatoid arthritis, TIA and stroke in 2020 with recent multiple hospitalizations for altered mental status due to urinary retention, acute kidney injury, colonic stricture.  She was admitted on 12/15/2020 with increasing confusion with a very poor history with blood pressure being significantly elevated.  She had an MRI scan of the brain which showed a tiny subacute left cerebellar white matter infarct.  MRI also showed several areas of microhemorrhages as well as extensive changes of chronic small vessel disease.  Vascular imaging study of the brain and neck were not done.  Patient had no symptoms of slurred speech or ataxia.  2D echo showed ejection fraction of 55 to 60% without cardiac source of embolism.  LDL cholesterol was 93 mg percent and patient was on Lipitor 40 mg daily at that time and the dose was not changed.  Hemoglobin A1c was 6.4 on 01/26/2021.  Patient states she is done well since then she was rehospitalized 1 more time but now she is doing all right.  She has no focal neurological symptoms.  She denies any recent symptoms of stroke or TIA.  Review of electronic medical records show multiple recent hospitalizations in the last couple of years for metabolic encephalopathy which has been multifactorial due to multiple medical illnesses.  MRI scan of the brain 05/13/2019 showed  2 small acute chronic lacunar infarcts in the right frontal white matter and right centrum semiovale.  MR angiograms of the neck and brain on 03/31/2019 showed no large vessel stenosis or occlusion.  She did not have neurological consultation during that admission which was complicated by medical issues or even outpatient follow-up to current visit Review ROS:   14 system review of systems is positive for back pain, difficulty walking and all other systems negative  PMH:  Past Medical History:  Diagnosis Date  . Acute on chronic kidney failure (HCC)   . Anemia   . Arthritis   . Chest pain   . Diverticulitis   . Hypertension   . Stroke (HCC)   . UTI (urinary tract infection) 10/2019    Social History:  Social History   Socioeconomic History  . Marital status: Divorced    Spouse name: Not on file  . Number of children: Not on file  . Years of education: Not on file  . Highest education level: Not on file  Occupational History  . Not on file  Tobacco Use  . Smoking status: Never Smoker  . Smokeless tobacco: Never Used  Vaping Use  . Vaping Use: Never used  Substance and Sexual Activity  . Alcohol use: Not Currently    Alcohol/week: 0.0 standard drinks    Comment: sometimes   . Drug use: No  . Sexual activity: Yes    Partners: Male    Birth control/protection: Condom  Other Topics Concern  . Not on file  Social History Narrative   Lives alone  in own home   Left handed   Drinks no caffeine   Social Determinants of Health   Financial Resource Strain: Not on file  Food Insecurity: Not on file  Transportation Needs: Not on file  Physical Activity: Not on file  Stress: Not on file  Social Connections: Not on file  Intimate Partner Violence: Not on file    Medications:   Current Outpatient Medications on File Prior to Visit  Medication Sig Dispense Refill  . carvedilol (COREG) 25 MG tablet Take 1 tablet (25 mg total) by mouth 2 (two) times daily. 60 tablet 0  .  diltiazem (CARDIZEM CD) 180 MG 24 hr capsule Take 1 capsule (180 mg total) by mouth 2 (two) times daily. 60 capsule 0  . docusate sodium (COLACE) 100 MG capsule Take 1 capsule (100 mg total) by mouth 2 (two) times daily. 30 capsule 0  . DULoxetine (CYMBALTA) 60 MG capsule Take 1 capsule (60 mg total) by mouth at bedtime. 30 capsule 0  . Ensure (ENSURE) Take 237 mLs by mouth daily in the afternoon. Vanilla    . hydroxychloroquine (PLAQUENIL) 200 MG tablet Take 1 tablet (200 mg total) by mouth daily. 30 tablet 0  . lactulose (CHRONULAC) 10 GM/15ML solution Take 15 mLs (10 g total) by mouth daily as needed for mild constipation. 1892 mL 0  . methocarbamol (ROBAXIN) 500 MG tablet Take 1 tablet (500 mg total) by mouth every 8 (eight) hours as needed for muscle spasms.    . Multiple Vitamin (MULTIVITAMIN WITH MINERALS) TABS tablet Take 1 tablet by mouth daily. 30 tablet 2  . pantoprazole (PROTONIX) 40 MG tablet Take 1 tablet (40 mg total) by mouth daily. 30 tablet 0   No current facility-administered medications on file prior to visit.    Allergies:  No Known Allergies  Physical Exam General: Mildly obese middle-aged African-American lady, seated, in no evident distress Head: head normocephalic and atraumatic.   Neck: supple with no carotid or supraclavicular bruits Cardiovascular: regular rate and rhythm, no murmurs Musculoskeletal: no deformity Skin:  no rash/petichiae Vascular:  Normal pulses all extremities  Neurologic Exam Mental Status: Awake and fully alert. Oriented to place and time. Recent and remote memory intact. Attention span, concentration and fund of knowledge appropriate. Mood and affect appropriate.  Cranial Nerves: Fundoscopic exam reveals sharp disc margins. Pupils equal, briskly reactive to light. Extraocular movements full without nystagmus. Visual fields full to confrontation. Hearing intact. Facial sensation intact. Face, tongue, palate moves normally and symmetrically.   Motor: Normal bulk and tone. Normal strength in all tested extremity muscles. Sensory.: intact to touch , pinprick , position and vibratory sensation.  Coordination: Rapid alternating movements normal in all extremities. Finger-to-nose and heel-to-shin performed accurately bilaterally. Gait and Station: Arises from chair with slight difficulty. Stance is slightly stooped.. Gait demonstrates favoring of her back due to pain..  Not able to heel, toe and tandem walk   Reflexes: 1+ and symmetric. Toes downgoing.   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     PLAN: I had a long d/w patient about his recent stroke, risk for recurrent stroke/TIAs, personally independently reviewed imaging studies and stroke evaluation results and answered questions.  Stop aspirin and instead change to Plavix 75 mg daily for secondary stroke prevention and  maintain strict control of hypertension with blood pressure goal below 130/90, diabetes with hemoglobin A1c goal below 6.5% and lipids with LDL cholesterol goal below 70 mg/dL. I also advised the patient to eat a healthy diet with plenty of whole grains, cereals, fruits and vegetables, exercise regularly and maintain ideal body weight.  Check MR angiogram of the brain and neck.  Increase the dose of Lipitor to 60 mg daily.  Followup in the future with my nurse practitioner Shanda Bumps in 3 months or call earlier if necessary.  Greater than 50% time during this 45-minute consultation visit was spent on counseling and coordination of care about her silent cerebellar lacunar infarct and answering questions about stroke prevention and treatment. Delia Heady, MD Note: This document was prepared with digital dictation and possible smart  phrase technology. Any transcriptional errors that result from this process are unintentional.

## 2021-03-12 NOTE — Patient Instructions (Signed)
I had a long d/w patient about his recent stroke, risk for recurrent stroke/TIAs, personally independently reviewed imaging studies and stroke evaluation results and answered questions.  Stop aspirin and instead change to Plavix 75 mg daily for secondary stroke prevention and maintain strict control of hypertension with blood pressure goal below 130/90, diabetes with hemoglobin A1c goal below 6.5% and lipids with LDL cholesterol goal below 70 mg/dL. I also advised the patient to eat a healthy diet with plenty of whole grains, cereals, fruits and vegetables, exercise regularly and maintain ideal body weight.  Check MR angiogram of the brain and neck.  Increase the dose of Lipitor to 60 mg daily.  Followup in the future with my nurse practitioner Shanda Bumps in 3 months or call earlier if necessary.  Stroke Prevention Some medical conditions and behaviors are associated with a higher chance of having a stroke. You can help prevent a stroke by making nutrition, lifestyle, and other changes, including managing any medical conditions you may have. What nutrition changes can be made?  Eat healthy foods. You can do this by: ? Choosing foods high in fiber, such as fresh fruits and vegetables and whole grains. ? Eating at least 5 or more servings of fruits and vegetables a day. Try to fill half of your plate at each meal with fruits and vegetables. ? Choosing lean protein foods, such as lean cuts of meat, poultry without skin, fish, tofu, beans, and nuts. ? Eating low-fat dairy products. ? Avoiding foods that are high in salt (sodium). This can help lower blood pressure. ? Avoiding foods that have saturated fat, trans fat, and cholesterol. This can help prevent high cholesterol. ? Avoiding processed and premade foods.  Follow your health care provider's specific guidelines for losing weight, controlling high blood pressure (hypertension), lowering high cholesterol, and managing diabetes. These may  include: ? Reducing your daily calorie intake. ? Limiting your daily sodium intake to 1,500 milligrams (mg). ? Using only healthy fats for cooking, such as olive oil, canola oil, or sunflower oil. ? Counting your daily carbohydrate intake.   What lifestyle changes can be made?  Maintain a healthy weight. Talk to your health care provider about your ideal weight.  Get at least 30 minutes of moderate physical activity at least 5 days a week. Moderate activity includes brisk walking, biking, and swimming.  Do not use any products that contain nicotine or tobacco, such as cigarettes and e-cigarettes. If you need help quitting, ask your health care provider. It may also be helpful to avoid exposure to secondhand smoke.  Limit alcohol intake to no more than 1 drink a day for nonpregnant women and 2 drinks a day for men. One drink equals 12 oz of beer, 5 oz of wine, or 1 oz of hard liquor.  Stop any illegal drug use.  Avoid taking birth control pills. Talk to your health care provider about the risks of taking birth control pills if: ? You are over 17 years old. ? You smoke. ? You get migraines. ? You have ever had a blood clot. What other changes can be made?  Manage your cholesterol levels. ? Eating a healthy diet is important for preventing high cholesterol. If cholesterol cannot be managed through diet alone, you may also need to take medicines. ? Take any prescribed medicines to control your cholesterol as told by your health care provider.  Manage your diabetes. ? Eating a healthy diet and exercising regularly are important parts of managing your blood sugar.  If your blood sugar cannot be managed through diet and exercise, you may need to take medicines. ? Take any prescribed medicines to control your diabetes as told by your health care provider.  Control your hypertension. ? To reduce your risk of stroke, try to keep your blood pressure below 130/80. ? Eating a healthy diet and  exercising regularly are an important part of controlling your blood pressure. If your blood pressure cannot be managed through diet and exercise, you may need to take medicines. ? Take any prescribed medicines to control hypertension as told by your health care provider. ? Ask your health care provider if you should monitor your blood pressure at home. ? Have your blood pressure checked every year, even if your blood pressure is normal. Blood pressure increases with age and some medical conditions.  Get evaluated for sleep disorders (sleep apnea). Talk to your health care provider about getting a sleep evaluation if you snore a lot or have excessive sleepiness.  Take over-the-counter and prescription medicines only as told by your health care provider. Aspirin or blood thinners (antiplatelets or anticoagulants) may be recommended to reduce your risk of forming blood clots that can lead to stroke.  Make sure that any other medical conditions you have, such as atrial fibrillation or atherosclerosis, are managed. What are the warning signs of a stroke? The warning signs of a stroke can be easily remembered as BEFAST.  B is for balance. Signs include: ? Dizziness. ? Loss of balance or coordination. ? Sudden trouble walking.  E is for eyes. Signs include: ? A sudden change in vision. ? Trouble seeing.  F is for face. Signs include: ? Sudden weakness or numbness of the face. ? The face or eyelid drooping to one side.  A is for arms. Signs include: ? Sudden weakness or numbness of the arm, usually on one side of the body.  S is for speech. Signs include: ? Trouble speaking (aphasia). ? Trouble understanding.  T is for time. ? These symptoms may represent a serious problem that is an emergency. Do not wait to see if the symptoms will go away. Get medical help right away. Call your local emergency services (911 in the [place] Do not drive yourself to the hospital.  Other signs of stroke  may include: ? A sudden, severe headache with no known cause. ? Nausea or vomiting. ? Seizure. Where to find more information For more information, visit:  American Stroke Association: www.strokeassociation.org  National Stroke Association: www.stroke.org Summary  You can prevent a stroke by eating healthy, exercising, not smoking, limiting alcohol intake, and managing any medical conditions you may have.  Do not use any products that contain nicotine or tobacco, such as cigarettes and e-cigarettes. If you need help quitting, ask your health care provider. It may also be helpful to avoid exposure to secondhand smoke.  Remember BEFAST for warning signs of stroke. Get help right away if you or a loved one has any of these signs. This information is not intended to replace advice given to you by your health care provider. Make sure you discuss any questions you have with your health care provider. Document Revised: 09/25/2017 Document Reviewed: 11/18/2016 Elsevier Patient Education  2021 [employer].

## 2021-03-13 ENCOUNTER — Ambulatory Visit: Payer: Medicare Other | Admitting: Endocrinology

## 2021-03-13 ENCOUNTER — Telehealth: Payer: Self-pay | Admitting: Neurology

## 2021-03-13 NOTE — Telephone Encounter (Signed)
MR Angio neck w/wo contrast & MR Angio head wo contrast sent to [facility]. No auth required for [facility] Medicare.

## 2021-03-19 ENCOUNTER — Emergency Department (HOSPITAL_COMMUNITY): Payer: Medicare Other

## 2021-03-19 ENCOUNTER — Inpatient Hospital Stay (HOSPITAL_COMMUNITY)
Admission: EM | Admit: 2021-03-19 | Discharge: 2021-03-21 | DRG: 078 | Disposition: A | Discharge status: Home Health | Payer: Medicare Other | Attending: Family Medicine | Admitting: Family Medicine

## 2021-03-19 ENCOUNTER — Encounter (HOSPITAL_COMMUNITY): Payer: Self-pay

## 2021-03-19 DIAGNOSIS — K529 Noninfective gastroenteritis and colitis, unspecified: Secondary | ICD-10-CM

## 2021-03-19 DIAGNOSIS — Z8249 Family history of ischemic heart disease and other diseases of the circulatory system: Secondary | ICD-10-CM | POA: Diagnosis not present

## 2021-03-19 DIAGNOSIS — R4701 Aphasia: Secondary | ICD-10-CM | POA: Diagnosis present

## 2021-03-19 DIAGNOSIS — R600 Localized edema: Secondary | ICD-10-CM | POA: Diagnosis present

## 2021-03-19 DIAGNOSIS — Z833 Family history of diabetes mellitus: Secondary | ICD-10-CM

## 2021-03-19 DIAGNOSIS — Z86718 Personal history of other venous thrombosis and embolism: Secondary | ICD-10-CM

## 2021-03-19 DIAGNOSIS — R4182 Altered mental status, unspecified: Secondary | ICD-10-CM

## 2021-03-19 DIAGNOSIS — I671 Cerebral aneurysm, nonruptured: Secondary | ICD-10-CM | POA: Diagnosis present

## 2021-03-19 DIAGNOSIS — Z7902 Long term (current) use of antithrombotics/antiplatelets: Secondary | ICD-10-CM | POA: Diagnosis not present

## 2021-03-19 DIAGNOSIS — I674 Hypertensive encephalopathy: Principal | ICD-10-CM | POA: Diagnosis present

## 2021-03-19 DIAGNOSIS — M069 Rheumatoid arthritis, unspecified: Secondary | ICD-10-CM | POA: Diagnosis present

## 2021-03-19 DIAGNOSIS — Z79899 Other long term (current) drug therapy: Secondary | ICD-10-CM

## 2021-03-19 DIAGNOSIS — N1832 Chronic kidney disease, stage 3b: Secondary | ICD-10-CM

## 2021-03-19 DIAGNOSIS — F329 Major depressive disorder, single episode, unspecified: Secondary | ICD-10-CM | POA: Diagnosis present

## 2021-03-19 DIAGNOSIS — K56699 Other intestinal obstruction unspecified as to partial versus complete obstruction: Secondary | ICD-10-CM

## 2021-03-19 DIAGNOSIS — T447X6A Underdosing of beta-adrenoreceptor antagonists, initial encounter: Secondary | ICD-10-CM | POA: Diagnosis present

## 2021-03-19 DIAGNOSIS — I10 Essential (primary) hypertension: Secondary | ICD-10-CM

## 2021-03-19 DIAGNOSIS — M3500 Sicca syndrome, unspecified: Secondary | ICD-10-CM | POA: Diagnosis present

## 2021-03-19 DIAGNOSIS — M545 Low back pain, unspecified: Secondary | ICD-10-CM | POA: Diagnosis present

## 2021-03-19 DIAGNOSIS — I129 Hypertensive chronic kidney disease with stage 1 through stage 4 chronic kidney disease, or unspecified chronic kidney disease: Secondary | ICD-10-CM | POA: Diagnosis present

## 2021-03-19 DIAGNOSIS — M47816 Spondylosis without myelopathy or radiculopathy, lumbar region: Secondary | ICD-10-CM | POA: Diagnosis present

## 2021-03-19 DIAGNOSIS — I160 Hypertensive urgency: Secondary | ICD-10-CM | POA: Diagnosis not present

## 2021-03-19 DIAGNOSIS — E042 Nontoxic multinodular goiter: Secondary | ICD-10-CM | POA: Diagnosis present

## 2021-03-19 DIAGNOSIS — M21371 Foot drop, right foot: Secondary | ICD-10-CM | POA: Diagnosis present

## 2021-03-19 DIAGNOSIS — R471 Dysarthria and anarthria: Secondary | ICD-10-CM | POA: Diagnosis present

## 2021-03-19 DIAGNOSIS — R29810 Facial weakness: Secondary | ICD-10-CM | POA: Diagnosis present

## 2021-03-19 DIAGNOSIS — Z20822 Contact with and (suspected) exposure to covid-19: Secondary | ICD-10-CM | POA: Diagnosis present

## 2021-03-19 DIAGNOSIS — E869 Volume depletion, unspecified: Secondary | ICD-10-CM

## 2021-03-19 DIAGNOSIS — Z8673 Personal history of transient ischemic attack (TIA), and cerebral infarction without residual deficits: Secondary | ICD-10-CM

## 2021-03-19 DIAGNOSIS — Z87440 Personal history of urinary (tract) infections: Secondary | ICD-10-CM

## 2021-03-19 DIAGNOSIS — E785 Hyperlipidemia, unspecified: Secondary | ICD-10-CM | POA: Diagnosis present

## 2021-03-19 DIAGNOSIS — Z91138 Patient's unintentional underdosing of medication regimen for other reason: Secondary | ICD-10-CM

## 2021-03-19 LAB — I-STAT CHEM 8, ED
BUN: 38 mg/dL — ABNORMAL HIGH (ref 8–23)
Calcium, Ion: 1.03 mmol/L — ABNORMAL LOW (ref 1.15–1.40)
Chloride: 111 mmol/L (ref 98–111)
Creatinine, Ser: 1.4 mg/dL — ABNORMAL HIGH (ref 0.44–1.00)
Glucose, Bld: 101 mg/dL — ABNORMAL HIGH (ref 70–99)
HCT: 38 % (ref 36.0–46.0)
Hemoglobin: 12.9 g/dL (ref 12.0–15.0)
Potassium: 4.5 mmol/L (ref 3.5–5.1)
Sodium: 139 mmol/L (ref 135–145)
TCO2: 23 mmol/L (ref 22–32)

## 2021-03-19 LAB — URINALYSIS, ROUTINE W REFLEX MICROSCOPIC
Bacteria, UA: NONE SEEN
Bilirubin Urine: NEGATIVE
Glucose, UA: NEGATIVE mg/dL
Hgb urine dipstick: NEGATIVE
Ketones, ur: NEGATIVE mg/dL
Leukocytes,Ua: NEGATIVE
Nitrite: NEGATIVE
Protein, ur: 30 mg/dL — AB
Specific Gravity, Urine: 1.032 — ABNORMAL HIGH (ref 1.005–1.030)
pH: 6 (ref 5.0–8.0)

## 2021-03-19 LAB — COMPREHENSIVE METABOLIC PANEL
ALT: 10 U/L (ref 0–44)
AST: 15 U/L (ref 15–41)
Albumin: 3.8 g/dL (ref 3.5–5.0)
Alkaline Phosphatase: 98 U/L (ref 38–126)
Anion gap: 13 (ref 5–15)
BUN: 37 mg/dL — ABNORMAL HIGH (ref 8–23)
CO2: 20 mmol/L — ABNORMAL LOW (ref 22–32)
Calcium: 9.3 mg/dL (ref 8.9–10.3)
Chloride: 107 mmol/L (ref 98–111)
Creatinine, Ser: 1.45 mg/dL — ABNORMAL HIGH (ref 0.44–1.00)
GFR, Estimated: 39 mL/min — ABNORMAL LOW (ref >60)
Glucose, Bld: 100 mg/dL — ABNORMAL HIGH (ref 70–99)
Potassium: 4.7 mmol/L (ref 3.5–5.1)
Sodium: 140 mmol/L (ref 135–145)
Total Bilirubin: 0.9 mg/dL (ref 0.3–1.2)
Total Protein: 7.3 g/dL (ref 6.5–8.1)

## 2021-03-19 LAB — CBC
HCT: 37.6 % (ref 36.0–46.0)
Hemoglobin: 12 g/dL (ref 12.0–15.0)
MCH: 30.4 pg (ref 26.0–34.0)
MCHC: 31.9 g/dL (ref 30.0–36.0)
MCV: 95.2 fL (ref 80.0–100.0)
Platelets: 208 10*3/uL (ref 150–400)
RBC: 3.95 MIL/uL (ref 3.87–5.11)
RDW: 14.4 % (ref 11.5–15.5)
WBC: 10.2 10*3/uL (ref 4.0–10.5)
nRBC: 0 % (ref 0.0–0.2)

## 2021-03-19 LAB — DIFFERENTIAL
Abs Immature Granulocytes: 0.05 10*3/uL (ref 0.00–0.07)
Basophils Absolute: 0 10*3/uL (ref 0.0–0.1)
Basophils Relative: 0 %
Eosinophils Absolute: 0.2 10*3/uL (ref 0.0–0.5)
Eosinophils Relative: 2 %
Immature Granulocytes: 1 %
Lymphocytes Relative: 17 %
Lymphs Abs: 1.8 10*3/uL (ref 0.7–4.0)
Monocytes Absolute: 0.8 10*3/uL (ref 0.1–1.0)
Monocytes Relative: 8 %
Neutro Abs: 7.3 10*3/uL (ref 1.7–7.7)
Neutrophils Relative %: 72 %

## 2021-03-19 LAB — ETHANOL: Alcohol, Ethyl (B): <10 mg/dL (ref <10)

## 2021-03-19 LAB — LACTIC ACID, PLASMA: Lactic Acid, Venous: 0.8 mmol/L (ref 0.5–1.9)

## 2021-03-19 LAB — RAPID URINE DRUG SCREEN, HOSP PERFORMED
Amphetamines: NOT DETECTED
Barbiturates: NOT DETECTED
Benzodiazepines: NOT DETECTED
Cocaine: NOT DETECTED
Opiates: NOT DETECTED
Tetrahydrocannabinol: NOT DETECTED

## 2021-03-19 LAB — RESP PANEL BY RT-PCR (FLU A&B, COVID) ARPGX2
Influenza A by PCR: NEGATIVE
Influenza B by PCR: NEGATIVE
SARS Coronavirus 2 by RT PCR: NEGATIVE

## 2021-03-19 LAB — PROTIME-INR
INR: 1.1 (ref 0.8–1.2)
Prothrombin Time: 13.9 seconds (ref 11.4–15.2)

## 2021-03-19 LAB — APTT: aPTT: 23 seconds — ABNORMAL LOW (ref 24–36)

## 2021-03-19 LAB — CK: Total CK: 72 U/L (ref 38–234)

## 2021-03-19 MED ORDER — ATORVASTATIN CALCIUM 40 MG PO TABS
60.0000 mg | ORAL_TABLET | Freq: Every day | ORAL | Status: DC
  Administered 2021-03-20 – 2021-03-21 (×2): 60 mg via ORAL
  Filled 2021-03-19 (×2): qty 1

## 2021-03-19 MED ORDER — IOPAMIDOL (ISOVUE-300) INJECTION 61%
100.0000 mL | Freq: Once | INTRAVENOUS | Status: AC | PRN
  Administered 2021-03-19: 100 mL via INTRAVENOUS

## 2021-03-19 MED ORDER — HYDROXYCHLOROQUINE SULFATE 200 MG PO TABS
200.0000 mg | ORAL_TABLET | Freq: Every day | ORAL | Status: DC
  Administered 2021-03-20 – 2021-03-21 (×2): 200 mg via ORAL
  Filled 2021-03-19 (×2): qty 1

## 2021-03-19 MED ORDER — CLOPIDOGREL BISULFATE 75 MG PO TABS
75.0000 mg | ORAL_TABLET | Freq: Every day | ORAL | Status: DC
  Administered 2021-03-20 – 2021-03-21 (×2): 75 mg via ORAL
  Filled 2021-03-19 (×2): qty 1

## 2021-03-19 MED ORDER — CARVEDILOL 12.5 MG PO TABS
25.0000 mg | ORAL_TABLET | Freq: Two times a day (BID) | ORAL | Status: DC
  Filled 2021-03-19: qty 2

## 2021-03-19 MED ORDER — DILTIAZEM HCL ER COATED BEADS 180 MG PO CP24
180.0000 mg | ORAL_CAPSULE | Freq: Two times a day (BID) | ORAL | Status: DC
  Filled 2021-03-19: qty 1

## 2021-03-19 MED ORDER — DULOXETINE HCL 60 MG PO CPEP
60.0000 mg | ORAL_CAPSULE | Freq: Every day | ORAL | Status: DC
  Administered 2021-03-19 – 2021-03-20 (×2): 60 mg via ORAL
  Filled 2021-03-19 (×2): qty 1

## 2021-03-19 MED ORDER — ENOXAPARIN SODIUM 40 MG/0.4ML IJ SOSY
40.0000 mg | PREFILLED_SYRINGE | INTRAMUSCULAR | Status: DC
  Administered 2021-03-19 – 2021-03-20 (×2): 40 mg via SUBCUTANEOUS
  Filled 2021-03-19 (×2): qty 0.4

## 2021-03-19 MED ORDER — SODIUM CHLORIDE 0.9 % IV BOLUS
500.0000 mL | Freq: Once | INTRAVENOUS | Status: AC
  Administered 2021-03-19: 500 mL via INTRAVENOUS

## 2021-03-19 MED ORDER — LABETALOL HCL 5 MG/ML IV SOLN
10.0000 mg | INTRAVENOUS | Status: DC | PRN
  Administered 2021-03-20: 10 mg via INTRAVENOUS
  Filled 2021-03-19: qty 4

## 2021-03-19 NOTE — ED Provider Notes (Signed)
  Physical Exam  BP (!) 177/119   Pulse 88   Temp 97.9 F (36.6 C) (Oral)   Resp 19   SpO2 100%   Physical Exam  ED Course/Procedures     Procedures  MDM  Care assumed at 4 PM from Dr. Denton Lank.  Patient is here with code stroke.  Patient was found altered sometime today.  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  Patient does not qualify for any intervention right now.  Neurology plans to get MRI and EEG.  Patient also has history of UTI and urine is pending at admission.  Plan is to admit patient.  At this point, family practice will admit.     Charlynne Pander, MD 03/19/21 [phone]

## 2021-03-19 NOTE — ED Notes (Signed)
Daughter [name] [phone] would like an update when possible

## 2021-03-19 NOTE — Progress Notes (Signed)
EEG complete - results pending 

## 2021-03-19 NOTE — H&P (Addendum)
Family Medicine Teaching [facility] Admission History and Physical Service Pager: [phone]  Patient name: Michelle Graves Medical record number: [id number] Date of birth: January 21, 1951 Age: 70 y.o. Gender: female  Primary Care Provider: [facility], [facility]: Neurology Code Status: Full  Preferred Emergency Contact: [name] (daughter) [phone]  Chief Complaint: confusion  Assessment and Plan: Michelle Graves is a 70 y.o. female presenting with altered mental status . PMH is significant for stroke in 2020, encephalopathy, HTN, CKD, GAD, depression, multi nodular goiter, RA, Sjogren's, hx bowel obstruction, lumbar spondylosis.   AMS  On initial presentation patient with decreased LOC, bilaterally arm and leg weakness, expressive aphasia, and dysarthria. Code stroke called. Last known well on 5/23 at 2200. Outside of tPA window. Elevated BP ranging from 157-188/100-137. CT head without acute intracranial hemorrhage or evidence of acute infarction. CTA head and neck without large vessel occlusion or hemodynamically significant stenosis. 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 CXR with chronic cardiomegaly and tortuous aorta. Unclear etiology at this point, differential includes seizure given her initial unresponsiveness and possible post ictal state. EEG performed and results pending. Also considering stroke given history or prior strokes and increased stroke risk though CT normal. Obtaining MRI. Also consider infectious etiology though afebrile and without white count, as well as normal CXR. Obtaining urine culture, serum lactate. Hypoglycemia unlikely as glucose 100 on arrival. Also considering medications as a cause of AMS as patient not clear on her meds and how much she takes though daughter organizes and distributes her medications. Of note, patient with prior multiple recent  hospitalizations for AMS due to urinary retention, AKI, colonic stricture. Most recently on 01/30/21 in the setting of UTI, hypoglycemia and polypharmacy. Admitting patient for further work up for AMS. - admit to FPTS progressive, attending Dr. Miquel Dunn  - Neurology following, appreciate recommendations  - vitals per floor - frequent neuro checks  - labs:  - A1c, lipid panel, TSH, ethenal level, UDS - imaging:  - f/u MRI, EEG - medication:  - continue home plavix. No aspirin unless mRI positive for stroke  - permissive HTN <220/110  - diet after passes swallow study  - seizure precautions - PT/OT   Hx TIA and stroke in 2020 In 2019 MRI brain showed incidental finding of tiny subacute cerebellar infarct, chronic small vessel disease, micro hemorrhages consistent with uncontrolled HTN. In 2020 MRI showed 2 small chronic lacular WM infarcts in right frontal white matter. Patient has no residual weakness from piror stroke. Recently seen by Dr. Pearlean Brownie on 5/17. Stopped ASA  and started plavix  qd - neurology following, appreciate recommendations - frequent neuro checks - f/u MRI  - continue Plavix and Lipitor  - remained of plan above   HTN BP on admission ranging from 157-188/100-137. Allowing for permissive HTN, but patient with aneurism on imaging so want to also ensure some BP control. Home medication: Carvedilol  BID, Diltiazem  daily - permissive HTN <220/110  - hold home meds - prn Labetalol if BP >220/110  Chronic kidney disease III Cr 1.45. GFR 39. Around baseline which seems to be around 1.3-1.5. - continue to monitor - am BMP - avoid nephrotoxic agents   HLD LDL 93 on 12/16/20. Goal < /dl  Home medication: Lipitor   - continue Lipitor    Rheumatoid arthritis  Home medication: Hydroxychloroquine  daily - continue home med    FEN/GI: NPO  until passes swallow study  Prophylaxis: Lovenox  Disposition: Progressive  History of Present Illness:   Michelle Graves is a 70 y.o. female presenting with altered mental status.  She was brought here by her daughter. When we examined patient she was alone. She is not able to give a good history because she states she does not remember anything. Per previous notes, was in normal health yesterday, 5/23 at 10pm. Today daughter called patient and noted change in mental status- was not communicating the same and sounded sleepy. Daughter had friend check on patient who found her sleepy and not speaking. 911 called and patient brought in by EMS. En route was still not talking and was not able to follow commands.  Patient seems to have a little difficulty with word finding. It takes her a little bit to answer questions. Oriented to self. She cannot express where she is currently and does not know why or how she got here. 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   At baseline, she lives alone and states her daughter helps with her finances and medication. She is able to bathe herself and do ADLs.   Denies any current pain, headache, N/V, CP, lightheadedness, palpitations, stomach pain,  Endorses painful urination, possibly with frequency and incontinence. No weakness. No constipation or diarrhea.  No tobacco or alcohol or illicit substances.    ED course Code stroke activated and neurology evaluated on arrival  CT obtained without acute stroke or hemorrhage IV NS bolus  Review Of Systems: Per HPI    Patient Active Problem List   Diagnosis Date Noted  . Altered mental status 03/19/2021  . Multinodular goiter 02/25/2021  . Acute metabolic encephalopathy 01/21/2021  . Hypertensive urgency 01/21/2021  . Sepsis secondary to UTI (HCC) 01/21/2021  . Adrenal insufficiency (HCC) 12/18/2020  . Encephalopathy 12/17/2020  . TIA (transient ischemic attack) 12/15/2020  . 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 Bowel obstruction (HCC) 05/06/2020  . SIRS (systemic inflammatory response  syndrome) (HCC) 05/06/2020  . UTI (urinary tract infection) 11/08/2019  . Major depressive disorder, recurrent episode, moderate (HCC) 11/08/2019  . Urinary retention 10/28/2019  . Acute on chronic kidney failure (HCC) 10/27/2019  . Malnutrition of moderate degree 10/03/2019  . Colitis 09/30/2019  . AKI (acute kidney injury) (HCC) 05/28/2019  . Nausea & vomiting 05/28/2019  . 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 Anemia, chronic disease 05/28/2019  . Swelling   . Small vessel disease, cerebrovascular   . Acute ischemic stroke (HCC)   . Weakness   . Acute left ankle pain   . Acute on chronic renal failure (HCC) 05/13/2019  . Weakness of both lower extremities 05/13/2019  . Cerebral thrombosis with cerebral infarction 03/31/2019  . New onset a-fib (HCC) 03/27/2019  . Acute kidney injury superimposed on CKD (HCC) 03/24/2019  . Hypotension 03/24/2019  . Hematemesis 03/24/2019  . Dark stools 03/24/2019  . Diverticulitis of intestine without perforation or abscess without bleeding   . Sinus tachycardia 08/27/2016  . Diverticulitis 08/26/2016  . HYPERTENSION, MALIGNANT ESSENTIAL 06/24/2007  . CKD (chronic kidney disease), stage III (HCC) 06/24/2007  . Rheumatoid arthritis (HCC) 06/24/2007    Past Medical History: Past Medical History:  Diagnosis Date  . Acute on chronic kidney failure (HCC)   . Anemia   . Arthritis   . Chest pain   . Diverticulitis   . Hypertension   . Stroke (HCC)   . UTI (urinary tract infection) 10/2019  Past Surgical History: Past Surgical History:  Procedure Laterality Date  . BIOPSY  04/01/2019   Procedure: BIOPSY;  Surgeon: Kathi Der, MD;  Location: [facility] ENDOSCOPY;  Service: Gastroenterology;;  . BIOPSY  10/05/2019   Procedure: BIOPSY;  Surgeon: Kathi Der, MD;  Location[facility] ENDOSCOPY;  Service: Gastroenterology;;  . BIOPSY  05/10/2020   Procedure: BIOPSY;  Surgeon: Kathi Der, MD;  Location: [facility] ENDOSCOPY;  Service: Gastroenterology;;   . BIOPSY  11/29/2020   Procedure: BIOPSY;  Surgeon: Charlott Rakes, MD;  Location: [facility] ENDOSCOPY;  Service: Gastroenterology;;  . COLONOSCOPY N/A 11/29/2020   Procedure: COLONOSCOPY;  Surgeon: Charlott Rakes, MD;  Location: [facility] ENDOSCOPY;  Service: Gastroenterology;  Laterality: N/A;  . COLONOSCOPY WITH PROPOFOL N/A 04/01/2019   Procedure: COLONOSCOPY WITH PROPOFOL;  Surgeon: Kathi Der, MD;  Location: [facility] ENDOSCOPY;  Service: Gastroenterology;  Laterality: N/A;  . COLONOSCOPY WITH PROPOFOL N/A 10/05/2019   Procedure: COLONOSCOPY WITH PROPOFOL;  Surgeon: Kathi Der, MD;  Location: [facility] ENDOSCOPY;  Service: Gastroenterology;  Laterality: N/A;  . COLONOSCOPY WITH PROPOFOL N/A 05/10/2020   Procedure: COLONOSCOPY WITH PROPOFOL;  Surgeon: Kathi Der, MD;  Location: [facility] ENDOSCOPY;  Service: Gastroenterology;  Laterality: N/A;  . ESOPHAGOGASTRODUODENOSCOPY (EGD) WITH PROPOFOL N/A 04/01/2019   Procedure: ESOPHAGOGASTRODUODENOSCOPY (EGD) WITH PROPOFOL;  Surgeon: Kathi Der, MD;  Location: [facility] ENDOSCOPY;  Service: Gastroenterology;  Laterality: N/A;  . ESOPHAGOGASTRODUODENOSCOPY (EGD) WITH PROPOFOL N/A 10/05/2019   Procedure: ESOPHAGOGASTRODUODENOSCOPY (EGD) WITH PROPOFOL;  Surgeon: Kathi Der, MD;  Location: [facility] ENDOSCOPY;  Service: Gastroenterology;  Laterality: N/A;  . POLYPECTOMY  11/29/2020   Procedure: POLYPECTOMY;  Surgeon: Charlott Rakes, MD;  Location: [facility] ENDOSCOPY;  Service: Gastroenterology;;  . Sunnie Nielsen TATTOO INJECTION  11/29/2020   Procedure: SUBMUCOSAL TATTOO INJECTION;  Surgeon: Charlott Rakes, MD;  Location: [facility] ENDOSCOPY;  Service: Gastroenterology;;  . TONSILLECTOMY      Social History: Social History   Tobacco Use  . Smoking status: Never Smoker  . Smokeless tobacco: Never Used  Vaping Use  . Vaping Use: Never used  Substance Use Topics  . Alcohol use: Not Currently    Alcohol/week: 0.0 standard drinks    Comment: sometimes   . Drug use: No     Family History: Family History  Problem Relation Age of Onset  . Hypertension Mother   . Diabetes Mother   . CAD Father        died of MI at age 22  . Hypertension Father   . Diabetes Sister   . Diabetes Sister   . Kidney disease Neg Hx   . Adrenal disorder Neg Hx     Allergies and Medications: No Known Allergies No current facility-administered medications on file prior to encounter.   Current Outpatient Medications on File Prior to Encounter  Medication Sig Dispense Refill  . atorvastatin (LIPITOR) 40 MG tablet Take 1.5 tablets (60 mg total) by mouth daily. 30 tablet 3  . carvedilol (COREG) 25 MG tablet Take 1 tablet (25 mg total) by mouth 2 (two) times daily. 60 tablet 0  . clopidogrel (PLAVIX) 75 MG tablet Take 1 tablet (75 mg total) by mouth daily. 30 tablet 11  . diltiazem (CARDIZEM CD) 180 MG 24 hr capsule Take 1 capsule (180 mg total) by mouth 2 (two) times daily. 60 capsule 0  . DULoxetine (CYMBALTA) 60 MG capsule Take 1 capsule (60 mg total) by mouth at bedtime. 30 capsule 0  . Ensure (ENSURE) Take 237 mLs by mouth daily in the afternoon. Vanilla    .  hydroxychloroquine (PLAQUENIL) 200 MG tablet Take 1 tablet (200 mg total) by mouth daily. 30 tablet 0  . lactulose (CHRONULAC) 10 GM/15ML solution Take 15 mLs (10 g total) by mouth daily as needed for mild constipation. 1892 mL 0  . methocarbamol (ROBAXIN) 500 MG tablet Take 1 tablet (500 mg total) by mouth every 8 (eight) hours as needed for muscle spasms.    . Multiple Vitamin (MULTIVITAMIN WITH MINERALS) TABS tablet Take 1 tablet by mouth daily. 30 tablet 2  . pantoprazole (PROTONIX) 40 MG tablet Take 1 tablet (40 mg total) by mouth daily. 30 tablet 0    Objective: BP (!) 172/121 (BP Location: Left Arm)   Pulse 85   Temp 97.9 F (36.6 C) (Oral)   Resp 13   SpO2 100%   Physical Exam Constitutional:      General: She is not in acute distress.    Appearance: Normal appearance. She is not ill-appearing.  HENT:      Head: Normocephalic and atraumatic.     Mouth/Throat:     Mouth: Mucous membranes are moist.  Eyes:     Extraocular Movements: Extraocular movements intact.     Conjunctiva/sclera: Conjunctivae normal.  Cardiovascular:     Rate and Rhythm: Normal rate and regular rhythm.     Pulses: Normal pulses.     Heart sounds: Normal heart sounds.  Pulmonary:     Effort: Pulmonary effort is normal.     Breath sounds: Normal breath sounds.  Abdominal:     General: There is no distension.     Palpations: Abdomen is soft.     Tenderness: There is no abdominal tenderness.  Musculoskeletal:        General: Normal range of motion.     Cervical back: Normal range of motion and neck supple.  Skin:    General: Skin is warm and dry.  Neurological:     Cranial Nerves: No cranial nerve deficit.     Sensory: No sensory deficit.     Motor: No weakness.     Coordination: Coordination normal.  Psychiatric:        Mood and Affect: Mood normal.        Behavior: Behavior normal.     Labs and Imaging: CBC BMET  Recent Labs  Lab 03/19/21 1444 03/19/21 1449  WBC 10.2  --   HGB 12.0 12.9  HCT 37.6 38.0  PLT 208  --    Recent Labs  Lab 03/19/21 1444 03/19/21 1449  NA 140 139  K 4.7 4.5  CL 107 111  CO2 20*  --   BUN 37* 38*  CREATININE 1.45* 1.40*  GLUCOSE 100* 101*  CALCIUM 9.3  --      EKG: NSR rate 92bpm   DG Chest Port 1 View  Result Date: 03/19/2021 CLINICAL DATA:  Mental status changes.  Weakness. EXAM: PORTABLE CHEST 1 VIEW COMPARISON:  01/21/2021 FINDINGS: Chronic cardiomegaly. Aortic tortuosity. The lungs are clear. No infiltrate, collapse or effusion. No pulmonary edema. IMPRESSION: Chronic cardiomegaly.  Aortic tortuosity.  No active disease. Electronically Signed   By: Paulina Fusi M.D.   On: 03/19/2021 16:53   CT HEAD CODE STROKE WO CONTRAST  Result Date: 03/19/2021 CLINICAL DATA:  Code stroke. EXAM: CT HEAD WITHOUT CONTRAST TECHNIQUE: Contiguous axial images were  obtained from the base of the skull through the vertex without intravenous contrast. COMPARISON:  01/21/2021 FINDINGS: Brain: No acute intracranial hemorrhage, mass effect, or edema. No new loss of gray-white differentiation.  Chronic infarct of the right basal ganglia and adjacent white matter. Fluent areas of hypoattenuation in the supratentorial white matter likely reflects stable chronic microvascular ischemic changes. Ventricles and sulci are stable in size and configuration. No extra-axial collection. Vascular: No hyperdense vessel. There is intracranial atherosclerotic calcification at the skull base. Skull: Unremarkable. Sinuses/Orbits: New mildly hyperdense intraconal tubular structure of the inferolateral left orbit (best seen on coronal series 6, image 19). Other: Mastoid air cells are clear. ASPECTS (Alberta Stroke Program Early CT Score) - Ganglionic level infarction (caudate, lentiform nuclei, internal capsule, insula, M1-M3 cortex): 7 - Supraganglionic infarction (M4-M6 cortex): 3 Total score (0-10 with 10 being normal): 10 IMPRESSION: There is no acute intracranial hemorrhage or evidence of acute infarction. ASPECT score is 10. New mildly hyperdense intraconal tubular structure of the inferolateral left orbit. 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 Stable chronic/nonemergent findings detailed above. These results were communicated to Dr. Amada Jupiter at 2:56 pm on 03/19/2021 by text page via the [facility] messaging system. Electronically Signed   By: Guadlupe Spanish M.D.   On: 03/19/2021 15:02   CT ANGIO HEAD NECK W WO CM W PERF (CODE STROKE)  Result Date: 03/19/2021 CLINICAL DATA:  Left-sided weakness EXAM: CT ANGIOGRAPHY HEAD AND NECK TECHNIQUE: Multidetector CT imaging of the head and neck was performed using the standard protocol during bolus administration of intravenous contrast. Multiplanar CT image reconstructions and MIPs were obtained to evaluate the vascular anatomy. Carotid stenosis measurements  (when applicable) are obtained utilizing NASCET criteria, using the distal internal carotid diameter as the denominator. CONTRAST:  ISOVUE-300 IOPAMIDOL (ISOVUE-300) INJECTION 61% COMPARISON:  None. FINDINGS: CTA NECK Aortic arch: Great vessel origins are patent. Right carotid system: Patent. Minimal calcified plaque at the ICA origin without stenosis. Retropharyngeal course of the ICA with the vessel extending as far medial as midline. Left carotid system: Patent. Minimal calcified plaque along the proximal internal carotid without stenosis. Vertebral arteries: Patent. Left vertebral artery is dominant. No stenosis. Skeleton: Degenerative changes of the cervical spine. Other neck: Heterogeneous thyroid, recent evaluated by ultrasound. Upper chest: No apical lung mass.  Scarring at the apices. Review of the MIP images confirms the above findings CTA HEAD Anterior circulation: Intracranial internal carotid arteries are patent with mild calcified plaque. Superiorly directed outpouching at the right ophthalmic artery origin measuring 2 x 1.6 mm (on sagittal series 10, image 92). There is an additional 1 mm posterosuperior directed outpouching at the aneurysm apex. A shallow, broad-based 1.1 x 1.5 mm (series 10, image 109) inferiorly directed outpouching is present at the distal supraclinoid left ICA. Anterior and middle cerebral arteries are patent. Posterior circulation: Intracranial vertebral arteries are patent. There is moderate stenosis of the distal right vertebral artery. Basilar artery is patent. Major cerebellar artery origins are patent. Posterior cerebral arteries are patent. Venous sinuses: Patent as allowed by contrast bolus timing. Review of the MIP images confirms the above findings IMPRESSION: No large vessel occlusion or hemodynamically significant stenosis. 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 Electronically Signed   By:  Guadlupe Spanish M.D.   On: 03/19/2021 16:12    Cora Collum, DO 03/19/2021, 9:34 PM PGY-1, [facility] FPTS Intern pager: [phone], text pages welcome

## 2021-03-19 NOTE — Code Documentation (Signed)
Stroke Response Nurse Documentation Code Documentation  Michelle Graves is a 70 y.o. female arriving to [facility] ED via [facility] on 03/19/2021 with past medical hx of stroke, HTN. Code stroke was activated by EMS. Patient from home where she was LKW at 2200 last night per family and now complaining of being unresponsive with facial droop.  Stroke team at the bedside on patient arrival. Labs drawn and patient cleared for CT by EDP. Patient to CT with team. NIHSS 15, see documentation for details and code stroke times. Patient with decreased LOC, disoriented, bilateral arm weakness, bilateral leg weakness, Expressive aphasia  and dysarthria  on exam. The following imaging was completed:  CT, CTA head and neck, CTP. Patient is not a candidate for tPA due to being outside window. Delay in CTA due to IV access. Multiple attempts by providers and called IV Team where a midline had to be placed. Care/Plan: q2 mNIHSS/VS with potential AMS workup. Bedside handoff with ED RN Dois Davenport.    Michelle Graves  Stroke Response RN

## 2021-03-19 NOTE — Consult Note (Addendum)
Neurology consult   CC: code stroke  History is obtained from: chart and daughter  HPI:Michelle Graves is a 70 yo female with extensive PMHx, including, stroke 2020, multiple admissions for encephalopathy, deconditioning, HTN, CKD, GAD, depression, multi nodular goiter, RA, Sjogren's, hx bowel obstruction, and lumbar spondylosis. Patient presents to [facility] ED as a code stroke. Per daughter, she spoke to patient at 2200 hours 03/18/21 and she was in her normal mental health state. Today, daughter called patient and her mental status had changed. She was not communicating per usual and sounded sleepy on the phone. Daughter sent a friend to check on patient, who found the patient sitting, sleepy, and not speaking. 911 was called and EMS brought patient in. BP in the field was 200s/100s and CBG 121. Per EMS, they found her essentially unresponsive, but shortly afterwards, patient was able to hold up fingers asked by EMS, but still was not talking. She was more alert then. Patient did not follow commands en route and was still not talking.   After brief exam on the ED bridge with airway clearance, patient was taken emergently to CT suite. CBG 92. CTH showed no acute finding. Extensive delay in obtaining CTA due to inability to find an IV. CTA negative for LVO. Patient out of the window for tPA. On bridge and in CT, patient could state her name, but not her age. Followed some simple commands and not others. Remained aphasic, receptively and expressively. NIHSS as below.   In review of chart, patient has had a few hospitalizations for encephalopathy attributed to hypoglycemia, metabolic derangements, colonic stricture and AKI on CKD. HbA1c was 6.4 on 01/26/21. At that visit, Dr. Pearlean Brownie prescribed Plavix 75mg  po qd for secondary stroke prevention. However, Plavix is not on recent medication list here.Lipitor was increased to 60mg  po qd (was 40mg ). On one visit, in 2019, she had a MRI brain which showed an incidental finding  of a tiny subacute cerebellar infarct, chronic small vessel disease, and micro hemorrhages consistent with her uncontrolled HTN. In 2020, an MRI showed 2 small chronic lacunar WM infarcts in right frontal white matter. MRA in 2020 showed no LVO.   Last OV note with out patient neurology was 03/12/21 with our Dr. Pearlean Brownie for stroke. 2020 with Dr. Lennette Bihari at [place]. Only action was increased dose of Cymbalta.   No one told EMS what medications patient takes, so NP spoke with her daughter. When patient saw Dr. Pearlean Brownie this month as stroke referral, he stopped her ASA 81mg  and started Plavix 75mg  po qd. Per daughter she is taking this (daughter sets up her pill box). Daughter states patient has no sequelae from prior stroke. Patient has progressed from walker to cane over months, and even to walking independently now. Patient can bathe, dress, and toilet alone. Daughter/son manage her finances and make sure she eats. MRS 3.   LKW: 2200 hours 03/18/21 tpa given?: No, outside the window.  IR Thrombectomy?:No LVO MRS: 3  NIHSS:  1a Level of Consciousness: 0 1b LOC Questions: 1 1c LOC Commands: 2 2 Best Gaze: 0 3 Visual: 0 4 Facial Palsy: 0 5a Motor Arm - left: 1 5b Motor Arm - Right: 1 6a Motor Leg - Left: 3 6b Motor Leg - Right: 3 7 Limb Ataxia: 0 8 Sensory: 0 9 Best Language: 2 10 Dysarthria: 1 11 Extinction and Inattention: 0 TOTAL:  14  ROS: Unable to perform robust ROS due to patient's aphasia and emergent nature of event.  Past Medical History:  Diagnosis Date  . Acute on chronic kidney failure (HCC)   . Anemia   . Arthritis   . Chest pain   . Diverticulitis   . Hypertension   . Stroke (HCC)   . UTI (urinary tract infection) 10/2019  depression, GAD, RA, Sjogren's, CKD, lumbar spondylosis, multinodular goiter, LLE DVT, HLD.    Family History  Problem Relation Age of Onset  . Hypertension Mother   . Diabetes Mother   . CAD Father        died of MI at age 47  .  Hypertension Father   . Diabetes Sister   . Diabetes Sister   . Kidney disease Neg Hx   . Adrenal disorder Neg Hx     Social History:  reports that she has never smoked. She has never used smokeless tobacco. She reports previous alcohol use. She reports that she does not use drugs.   Prior to Admission medications   Medication Sig Start Date End Date Taking? Authorizing Provider  atorvastatin (LIPITOR) 40 MG tablet Take 1.5 tablets (60 mg total) by mouth daily. 03/12/21 06/10/21  Micki Riley, MD  carvedilol (COREG) 25 MG tablet Take 1 tablet (25 mg total) by mouth 2 (two) times daily. 01/03/21   Fargo, Amy E, NP  clopidogrel (PLAVIX) 75 MG tablet Take 1 tablet (75 mg total) by mouth daily. 03/12/21   Micki Riley, MD  diltiazem (CARDIZEM CD) 180 MG 24 hr capsule Take 1 capsule (180 mg total) by mouth 2 (two) times daily. 01/03/21   Fargo, Amy E, NP  docusate sodium (COLACE) 100 MG capsule Take 1 capsule (100 mg total) by mouth 2 (two) times daily. 12/01/20   Barnetta Chapel, MD  DULoxetine (CYMBALTA) 60 MG capsule Take 1 capsule (60 mg total) by mouth at bedtime. 01/03/21   Fargo, Amy E, NP  Ensure (ENSURE) Take 237 mLs by mouth daily in the afternoon. Vanilla    [provider]  hydroxychloroquine (PLAQUENIL) 200 MG tablet Take 1 tablet (200 mg total) by mouth daily. 01/03/21   Fargo, Amy E, NP  lactulose (CHRONULAC) 10 GM/15ML solution Take 15 mLs (10 g total) by mouth daily as needed for mild constipation. 01/29/21   Zannie Cove, MD  methocarbamol (ROBAXIN) 500 MG tablet Take 1 tablet (500 mg total) by mouth every 8 (eight) hours as needed for muscle spasms. 01/29/21   Zannie Cove, MD  Multiple Vitamin (MULTIVITAMIN WITH MINERALS) TABS tablet Take 1 tablet by mouth daily. 06/03/19   Shahmehdi, Gemma Payor, MD  pantoprazole (PROTONIX) 40 MG tablet Take 1 tablet (40 mg total) by mouth daily. 01/03/21   Fargo, Amy E, NP    Exam: Current vital signs: T 97.18f, BP 188/113, HR 77, RR  15. SaO2 100% on RA.   Physical Exam  Constitutional: Appears well-developed and well-nourished.  Psych: Calm.  Eyes: No scleral injection HENT: No OP obstrucion Head: Normocephalic.  Cardiovascular: Normal rate and regular rhythm.  Respiratory: Effort normal . SaO2 100% on RA.  GI: Soft.  No distension. There is no tenderness.  Skin: WDI  Neuro: Mental Status: Patient is awake, alert. Unable to answer orientation questions due to aphasia. Later, can tell staff she is in the hospital. Later, able to say her name.  Patient is unable to give a clear and coherent history. No signs of neglect. Speech/Language:  Speech is + for receptive and expressive aphasia, when improved when back to ED room. Naming, fluency,  repetition not intact. Comprehension is slowed sometimes, but normal others when asked to perform tasks.  Cranial Nerves: II: Visual Fields are full. Pupils are equal, round, and reactive to light.  III,IV, VI: EOMI without ptosis or diploplia.  V: Facial sensation is symmetric to light touch in V1, V2, and V3. VII: Small right facial droop, which dissipated.  VIII: hearing is intact to voice. X: Uvula elevates symmetrically. XI: Shoulder shrug is symmetric. XII: tongue is midline without atrophy or fasciculations.  Motor: Does not understand directions for strength exam.   Sensory: She responds to noxious stimulation x4 Deep Tendon Reflexes: RUE:  Brachioradialis 2+   Biceps 2+ RLE: patella 0 LUE: brachioradialis 2+   Biceps 2+ LLE: patella 0 Plantars: Toes are downgoing bilaterally.  Cerebellar: Does not comprehend ataxia testing.   I have reviewed labs in epic and the pertinent results are: Creat 1.40   INR 1.1     aPTT  23       MD reviewed the images obtained:  NCT head  No acute infarct or hemorrhage.   MRI brain pending.  CTA head and neck  No large vessel occlusion or hemodynamically significant stenosis. 2 mm right periophthalmic aneurysm with a 1  mm daughter lobule. 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   Assessment: 70 yo female with stroke risk factors of HTN, prior stroke, and HLD. LKW 2200 hrs 03/18/21, so tPA was not administered due to outside tPA window. Given her aphasia, LVO was considered, but CTA head and neck negative for LVO, which ruled out need for IR procedure. Patient was taken back to ED room, where she was answering more, but aphasia still evident by replying yes to most questions, and inability to perform commands. Must consider seizure given her unresponsiveness and prior stroke. Given that her symptoms are resolving, believe this may be toxic metabolic encephalopathy, but will continue to rule out stroke with MRI brain and seizure with EEG.   Impression:  -AMS, aphasia, likely not stroke, but awaiting MRI brain.  -unresponsive in the field, query seizure.  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   Plan: All below are ordered.  - MRI brain without contrast.  - Recommend labs: HbA1c, lipid panel, TSH, Ethanol level, UDS.  - No Aspirin  daily unless MRI brain is + for stroke. - continue home Plavix as secondary stroke prevention. - SBP goal - Permissive hypertension  < 220/110 until MRI, if negative would treat as hypertensive urgency - PT/OT/SLP consult for deconditioning/swallow evaluation. - NIHSS as per protocol. - frequent neuro checks.  - rEEG to eval for any epileptogenic discharges. - Recommend metabolic/infectious workup with UA with UCx, CXR, CK, serum lactate.  Electronically signed by: Jimmye Norman, MSN, APN-BC, nurse practitioner and by MD. Note/plan to be edited by MD as needed.  Pager: [phone]   I have seen the patient and agree with the above note.   She has had repeated presentations of encephalopathy of unclear etiology.  She presents again with encephalopathy.  It is possible that she has had a stroke, but by no means definite.  Hypertensive encephalopathy would be a  consideration, and if the MRI shows PRES and this could be an explanation for recurrent presentations.  She is not on many psychoactive medications, Robaxin can cause MS and overdose, but unclear if that she had the opportunity to do this.  Will get work-up as above.  Michelle Slot, MD [facility] [phone]  If 7pm- 7am, please page  neurology on call as listed in AMION.

## 2021-03-19 NOTE — ED Provider Notes (Signed)
[facility] EMERGENCY DEPARTMENT Provider Note   CSN: [id number] Arrival date & time: 03/19/21  1430  An emergency department physician performed an initial assessment on this suspected stroke patient at 1445.  History Chief Complaint  Patient presents with  . Altered Mental Status    Michelle Graves is a 70 y.o. female.  Patient presents with altered mental status and ?aphasia, as code stroke activation. Patient not verbally responsive to questions other than occasionally 'yes'/'no' - level 5 caveat. Patient was found in chair by family member today with altered mental status. Last known/seen at baseline yesterday. No report of fevers. No report of trauma or fall.   The history is provided by the patient and the EMS personnel. The history is limited by the condition of the patient.  Altered Mental Status      Past Medical History:  Diagnosis Date  . Acute on chronic kidney failure (HCC)   . Anemia   . Arthritis   . Chest pain   . Diverticulitis   . Hypertension   . Stroke (HCC)   . UTI (urinary tract infection) 10/2019    Patient Active Problem List   Diagnosis Date Noted  . Multinodular goiter 02/25/2021  . Acute metabolic encephalopathy 01/21/2021  . Hypertensive urgency 01/21/2021  . Sepsis secondary to UTI (HCC) 01/21/2021  . Adrenal insufficiency (HCC) 12/18/2020  . Encephalopathy 12/17/2020  . TIA (transient ischemic attack) 12/15/2020  . 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 Bowel obstruction (HCC) 05/06/2020  . SIRS (systemic inflammatory response syndrome) (HCC) 05/06/2020  . UTI (urinary tract infection) 11/08/2019  . Major depressive disorder, recurrent episode, moderate (HCC) 11/08/2019  . Urinary retention 10/28/2019  . Acute on chronic kidney failure (HCC) 10/27/2019  . Malnutrition of moderate degree 10/03/2019  . Colitis 09/30/2019  . AKI (acute kidney injury) (HCC) 05/28/2019  . Nausea & vomiting 05/28/2019  . CMV colitis  (HCC) 05/28/2019  . Anemia, chronic disease 05/28/2019  . Swelling   . Small vessel disease, cerebrovascular   . Acute ischemic stroke (HCC)   . Weakness   . Acute left ankle pain   . Acute on chronic renal failure (HCC) 05/13/2019  . Weakness of both lower extremities 05/13/2019  . Cerebral thrombosis with cerebral infarction 03/31/2019  . New onset a-fib (HCC) 03/27/2019  . Acute kidney injury superimposed on CKD (HCC) 03/24/2019  . Hypotension 03/24/2019  . Hematemesis 03/24/2019  . Dark stools 03/24/2019  . Diverticulitis of intestine without perforation or abscess without bleeding   . Sinus tachycardia 08/27/2016  . Diverticulitis 08/26/2016  . HYPERTENSION, MALIGNANT ESSENTIAL 06/24/2007  . CKD (chronic kidney disease), stage III (HCC) 06/24/2007  . Rheumatoid arthritis (HCC) 06/24/2007    Past Surgical History:  Procedure Laterality Date  . BIOPSY  04/01/2019   Procedure: BIOPSY;  Surgeon: Kathi Der, MD;  Location: [facility] ENDOSCOPY;  Service: Gastroenterology;;  . BIOPSY  10/05/2019   Procedure: BIOPSY;  Surgeon: Kathi Der, MD;  Location: [facility] ENDOSCOPY;  Service: Gastroenterology;;  . BIOPSY  05/10/2020   Procedure: BIOPSY;  Surgeon: Kathi Der, MD;  Location: [facility] ENDOSCOPY;  Service: Gastroenterology;;  . BIOPSY  11/29/2020   Procedure: BIOPSY;  Surgeon: Charlott Rakes, MD;  Location: [facility] ENDOSCOPY;  Service: Gastroenterology;;  . COLONOSCOPY N/A 11/29/2020   Procedure: COLONOSCOPY;  Surgeon: Charlott Rakes, MD;  Location: [facility] ENDOSCOPY;  Service: Gastroenterology;  Laterality: N/A;  . COLONOSCOPY WITH PROPOFOL N/A 04/01/2019   Procedure: COLONOSCOPY WITH PROPOFOL;  Surgeon: Kathi Der, MD;  Location: [facility] ENDOSCOPY;  Service: Gastroenterology;  Laterality: N/A;  . COLONOSCOPY WITH PROPOFOL N/A 10/05/2019   Procedure: COLONOSCOPY WITH PROPOFOL;  Surgeon: Kathi Der, MD;  Location: [facility] ENDOSCOPY;  Service: Gastroenterology;  Laterality: N/A;  .  COLONOSCOPY WITH PROPOFOL N/A 05/10/2020   Procedure: COLONOSCOPY WITH PROPOFOL;  Surgeon: Kathi Der, MD;  Location: [facility] ENDOSCOPY;  Service: Gastroenterology;  Laterality: N/A;  . ESOPHAGOGASTRODUODENOSCOPY (EGD) WITH PROPOFOL N/A 04/01/2019   Procedure: ESOPHAGOGASTRODUODENOSCOPY (EGD) WITH PROPOFOL;  Surgeon: Kathi Der, MD;  Location: [facility] ENDOSCOPY;  Service: Gastroenterology;  Laterality: N/A;  . ESOPHAGOGASTRODUODENOSCOPY (EGD) WITH PROPOFOL N/A 10/05/2019   Procedure: ESOPHAGOGASTRODUODENOSCOPY (EGD) WITH PROPOFOL;  Surgeon: Kathi Der, MD;  Location: [facility] ENDOSCOPY;  Service: Gastroenterology;  Laterality: N/A;  . POLYPECTOMY  11/29/2020   Procedure: POLYPECTOMY;  Surgeon: Charlott Rakes, MD;  Location: [facility] ENDOSCOPY;  Service: Gastroenterology;;  . Sunnie Nielsen TATTOO INJECTION  11/29/2020   Procedure: SUBMUCOSAL TATTOO INJECTION;  Surgeon: Charlott Rakes, MD;  Location: [facility] ENDOSCOPY;  Service: Gastroenterology;;  . TONSILLECTOMY       OB History   No obstetric history on file.     Family History  Problem Relation Age of Onset  . Hypertension Mother   . Diabetes Mother   . CAD Father        died of MI at age 64  . Hypertension Father   . Diabetes Sister   . Diabetes Sister   . Kidney disease Neg Hx   . Adrenal disorder Neg Hx     Social History   Tobacco Use  . Smoking status: Never Smoker  . Smokeless tobacco: Never Used  Vaping Use  . Vaping Use: Never used  Substance Use Topics  . Alcohol use: Not Currently    Alcohol/week: 0.0 standard drinks    Comment: sometimes   . Drug use: No    Home Medications Prior to Admission medications   Medication Sig Start Date End Date Taking? Authorizing Provider  atorvastatin (LIPITOR) 40 MG tablet Take 1.5 tablets (60 mg total) by mouth daily. 03/12/21 06/10/21  Micki Riley, MD  carvedilol (COREG) 25 MG tablet Take 1 tablet (25 mg total) by mouth 2 (two) times daily. 01/03/21   Fargo, Amy E, NP   clopidogrel (PLAVIX) 75 MG tablet Take 1 tablet (75 mg total) by mouth daily. 03/12/21   Micki Riley, MD  diltiazem (CARDIZEM CD) 180 MG 24 hr capsule Take 1 capsule (180 mg total) by mouth 2 (two) times daily. 01/03/21   Fargo, Amy E, NP  docusate sodium (COLACE) 100 MG capsule Take 1 capsule (100 mg total) by mouth 2 (two) times daily. 12/01/20   Barnetta Chapel, MD  DULoxetine (CYMBALTA) 60 MG capsule Take 1 capsule (60 mg total) by mouth at bedtime. 01/03/21   Fargo, Amy E, NP  Ensure (ENSURE) Take 237 mLs by mouth daily in the afternoon. Vanilla    [provider]  hydroxychloroquine (PLAQUENIL) 200 MG tablet Take 1 tablet (200 mg total) by mouth daily. 01/03/21   Fargo, Amy E, NP  lactulose (CHRONULAC) 10 GM/15ML solution Take 15 mLs (10 g total) by mouth daily as needed for mild constipation. 01/29/21   Zannie Cove, MD  methocarbamol (ROBAXIN) 500 MG tablet Take 1 tablet (500 mg total) by mouth every 8 (eight) hours as needed for muscle spasms. 01/29/21   Zannie Cove, MD  Multiple Vitamin (MULTIVITAMIN WITH MINERALS) TABS tablet Take 1 tablet by mouth daily. 06/03/19   Kendell Bane, MD  pantoprazole (PROTONIX) 40 MG tablet Take 1 tablet (40 mg total) by mouth daily. 01/03/21   Octavia Heir, NP    Allergies    Patient has no known allergies.  Review of Systems   Review of Systems  Unable to perform ROS: Mental status change  level 5 caveat - altered mental status  Physical Exam Updated Vital Signs BP (!) 157/100 (BP Location: Left Arm)   Pulse 86   Resp 12   SpO2 97%   Physical Exam Vitals and nursing note reviewed.  Constitutional:      Appearance: Normal appearance. She is well-developed.  HENT:     Head: Atraumatic.     Nose: Nose normal.     Mouth/Throat:     Mouth: Mucous membranes are moist.  Eyes:     General: No scleral icterus.    Conjunctiva/sclera: Conjunctivae normal.     Pupils: Pupils are equal, round, and reactive to light.  Neck:      Vascular: No carotid bruit.     Trachea: No tracheal deviation.     Comments: Thyroid not grossly enlarged or tender. Trachea midline. No stiffness or rigidity.  Cardiovascular:     Rate and Rhythm: Normal rate and regular rhythm.     Pulses: Normal pulses.     Heart sounds: Normal heart sounds. No murmur heard. No friction rub. No gallop.   Pulmonary:     Effort: Pulmonary effort is normal. No respiratory distress.     Breath sounds: Normal breath sounds.  Abdominal:     General: Bowel sounds are normal. There is no distension.     Palpations: Abdomen is soft.     Tenderness: There is no abdominal tenderness. There is no guarding.  Genitourinary:    Comments: No cva tenderness.  Musculoskeletal:        General: No swelling or tenderness.     Cervical back: Normal range of motion and neck supple. No rigidity. No muscular tenderness.  Skin:    General: Skin is warm and dry.     Findings: No rash.  Neurological:     Mental Status: She is alert.     Comments: Alert, ?mild dysarthric speech however patient minimally verbally responsive, so difficult to assess. Pt does move bil extremities purposefully. Follows commands poorly. No gross facial asymmetry or weakness.   Psychiatric:     Comments: Decreased responsiveness. Flat affect.      ED Results / Procedures / Treatments   Labs (all labs ordered are listed, but only abnormal results are displayed) Results for orders placed or performed during the hospital encounter of 03/19/21  Protime-INR  Result Value Ref Range   Prothrombin Time 13.9 11.4 - 15.2 seconds   INR 1.1 0.8 - 1.2  APTT  Result Value Ref Range   aPTT 23 (L) 24 - 36 seconds  CBC  Result Value Ref Range   WBC 10.2 4.0 - 10.5 K/uL   RBC 3.95 3.87 - 5.11 MIL/uL   Hemoglobin 12.0 12.0 - 15.0 g/dL   HCT 16.1 09.6 - 04.5 %   MCV 95.2 80.0 - 100.0 fL   MCH 30.4 26.0 - 34.0 pg   MCHC 31.9 30.0 - 36.0 g/dL   RDW 40.9 81.1 - 91.4 %   Platelets 208 150 - 400 K/uL    nRBC 0.0 0.0 - 0.2 %  Differential  Result Value Ref Range   Neutrophils Relative % 72 %   Neutro Abs 7.3 1.7 - 7.7 K/uL  Lymphocytes Relative 17 %   Lymphs Abs 1.8 0.7 - 4.0 K/uL   Monocytes Relative 8 %   Monocytes Absolute 0.8 0.1 - 1.0 K/uL   Eosinophils Relative 2 %   Eosinophils Absolute 0.2 0.0 - 0.5 K/uL   Basophils Relative 0 %   Basophils Absolute 0.0 0.0 - 0.1 K/uL   Immature Granulocytes 1 %   Abs Immature Granulocytes 0.05 0.00 - 0.07 K/uL  Comprehensive metabolic panel  Result Value Ref Range   Sodium 140 135 - 145 mmol/L   Potassium 4.7 3.5 - 5.1 mmol/L   Chloride 107 98 - 111 mmol/L   CO2 20 (L) 22 - 32 mmol/L   Glucose, Bld 100 (H) 70 - 99 mg/dL   BUN 37 (H) 8 - 23 mg/dL   Creatinine, Ser 1.61 (H) 0.44 - 1.00 mg/dL   Calcium 9.3 8.9 - 09.6 mg/dL   Total Protein 7.3 6.5 - 8.1 g/dL   Albumin 3.8 3.5 - 5.0 g/dL   AST 15 15 - 41 U/L   ALT 10 0 - 44 U/L   Alkaline Phosphatase 98 38 - 126 U/L   Total Bilirubin 0.9 0.3 - 1.2 mg/dL   GFR, Estimated 39 (L) >60 mL/min   Anion gap 13 5 - 15  I-stat chem 8, ED  Result Value Ref Range   Sodium 139 135 - 145 mmol/L   Potassium 4.5 3.5 - 5.1 mmol/L   Chloride 111 98 - 111 mmol/L   BUN 38 (H) 8 - 23 mg/dL   Creatinine, Ser 0.45 (H) 0.44 - 1.00 mg/dL   Glucose, Bld 409 (H) 70 - 99 mg/dL   Calcium, Ion 8.11 (L) 1.15 - 1.40 mmol/L   TCO2 23 22 - 32 mmol/L   Hemoglobin 12.9 12.0 - 15.0 g/dL   HCT 91.4 78.2 - 95.6 %   [place] FNA BX THYROID 1ST LESION AFIRMA  Result Date: 03/08/2021 INDICATION: Indeterminate thyroid nodule of the isthmus. Request is made for fine-needle aspiration of indeterminate thyroid nodule. EXAM: ULTRASOUND GUIDED FINE NEEDLE ASPIRATION OF INDETERMINATE THYROID NODULE COMPARISON:  US THYROID 03/04/2021 MEDICATIONS: 4 mL 1% lidocaine COMPLICATIONS: None immediate. TECHNIQUE: Informed written consent was obtained from the patient after a discussion of the risks, benefits and alternatives to treatment.  Questions regarding the procedure were encouraged and answered. A timeout was performed prior to the initiation of the procedure. Pre-procedural ultrasound scanning demonstrated unchanged size and appearance of the indeterminate nodule within the isthmus. The procedure was planned. The neck was prepped in the usual sterile fashion, and a sterile drape was applied covering the operative field. A timeout was performed prior to the initiation of the procedure. Local anesthesia was provided with 1% lidocaine. Under direct ultrasound guidance, 4 FNA biopsies were performed of the isthmus nodule with a 25 gauge needle. Multiple ultrasound images were saved for procedural documentation purposes. The samples were prepared and submitted to pathology. Sample was also prepared for Afirma testing. Limited post procedural scanning was negative for hematoma or additional complication. Dressings were placed. The patient tolerated the above procedures procedure well without immediate postprocedural complication. FINDINGS: Nodule reference number based on prior diagnostic ultrasound: 2 Maximum size: 3.1 cm Location: Isthmus; ACR TI-RADS risk category: TR3 (3 points) Reason for biopsy: meets ACR TI-RADS criteria Ultrasound imaging confirms appropriate placement of the needles within the thyroid nodule. IMPRESSION: Technically successful ultrasound guided fine needle aspiration of indeterminate isthmus nodule. Read by: Loyce Dys PA-C Electronically Signed   By: Judie Petit.  Shick M.D.   On: 03/08/2021 13:32   US THYROID  Result Date: 03/04/2021 CLINICAL DATA:  Incidental on CT. EXAM: THYROID ULTRASOUND TECHNIQUE: Ultrasound examination of the thyroid gland and adjacent soft tissues was performed. COMPARISON:  01/21/2021 FINDINGS: Parenchymal Echotexture: Moderately heterogenous Isthmus: 0.8 cm Right lobe: 5.0 x 1.8 x 1.8 cm Left lobe: 4.8 x 1.8 x 2.0 cm _________________________________________________________ Estimated total number of  nodules >/= 1 cm: 4 Number of spongiform nodules >/=  2 cm not described below (TR1): 0 Number of mixed cystic and solid nodules >/= 1.5 cm not described below (TR2): 0 _________________________________________________________ Nodule # 1: Location: Isthmus; Mid Maximum size: 1.3 cm; Other 2 dimensions: 1.2 x 0.6 cm Composition: solid/almost completely solid (2) Echogenicity: isoechoic (1) Shape: not taller-than-wide (0) Margins: ill-defined (0) Echogenic foci: none (0) ACR TI-RADS total points: 3. ACR TI-RADS risk category: TR3 (3 points). ACR TI-RADS recommendations: Given size (<1.4 cm) and appearance, this nodule does NOT meet TI-RADS criteria for biopsy or dedicated follow-up. _________________________________________________________ Nodule # 2: Location: Isthmus; Inferior Maximum size: 3.1 cm; Other 2 dimensions: 2.6 x 2.0 cm Composition: solid/almost completely solid (2) Echogenicity: isoechoic (1) Shape: not taller-than-wide (0) Margins: ill-defined (0) Echogenic foci: none (0) ACR TI-RADS total points: 3. ACR TI-RADS risk category: TR3 (3 points). ACR TI-RADS recommendations: **Given size (>/= 2.5 cm) and appearance, fine needle aspiration of this mildly suspicious nodule should be considered based on TI-RADS criteria. _________________________________________________________ Nodule # 3: Location: Right; Mid Maximum size: 1.0 cm; Other 2 dimensions: 1.0 x 0.7 cm Composition: solid/almost completely solid (2) Echogenicity: isoechoic (1) Shape: not taller-than-wide (0) Margins: ill-defined (0) Echogenic foci: none (0) ACR TI-RADS total points: 3. ACR TI-RADS risk category: TR3 (3 points). ACR TI-RADS recommendations: Given size (<1.4 cm) and appearance, this nodule does NOT meet TI-RADS criteria for biopsy or dedicated follow-up. _________________________________________________________ Nodule # 4: Location: Right; Inferior Maximum size: 1.1 cm; Other 2 dimensions: 1.1 x 0.8 cm Composition: spongiform (0)  Echogenicity: hypoechoic (2) Shape: not taller-than-wide (0) Margins: ill-defined (0) Echogenic foci: none (0) ACR TI-RADS total points: 2. ACR TI-RADS risk category: TR2 (2 points). ACR TI-RADS recommendations: This nodule does NOT meet TI-RADS criteria for biopsy or dedicated follow-up. _________________________________________________________ Nodule # 5: Location: Left; Mid Maximum size: 0.8 cm; Other 2 dimensions: 0.7 x 0.7 cm Composition: cystic/almost completely cystic (0) Echogenicity: anechoic (0) Shape: not taller-than-wide (0) Margins: smooth (0) Echogenic foci: none (0) ACR TI-RADS total points: 0. ACR TI-RADS risk category: TR1 (0-1 points). ACR TI-RADS recommendations: This nodule does NOT meet TI-RADS criteria for biopsy or dedicated follow-up. _________________________________________________________ IMPRESSION: 1. Multinodular goiter. 2. Dominant solid nodule arising from the inferior aspect of the isthmus (labeled 2, 3.1 cm) which meets criteria (TI-RADS category 3) for tissue sampling. Recommend ultrasound-guided fine-needle aspiration. 3. Remaining bilateral scattered thyroid nodules appear benign and do not warrant additional ultrasound follow-up or tissue sampling. The above is in keeping with the ACR TI-RADS recommendations - J Am Coll Radiol 2017;14:587-595. Marliss Coots, MD [facility] Radiology Electronically Signed   By: Marliss Coots MD   On: 03/04/2021 15:56   CT HEAD CODE STROKE WO CONTRAST  Result Date: 03/19/2021 CLINICAL DATA:  Code stroke. EXAM: CT HEAD WITHOUT CONTRAST TECHNIQUE: Contiguous axial images were obtained from the base of the skull through the vertex without intravenous contrast. COMPARISON:  01/21/2021 FINDINGS: Brain: No acute intracranial hemorrhage, mass effect, or edema. No new loss of gray-white differentiation. Chronic infarct of the right  basal ganglia and adjacent white matter. Fluent areas of hypoattenuation  in the supratentorial white matter likely reflects stable chronic microvascular ischemic changes. Ventricles and sulci are stable in size and configuration. No extra-axial collection. Vascular: No hyperdense vessel. There is intracranial atherosclerotic calcification at the skull base. Skull: Unremarkable. Sinuses/Orbits: New mildly hyperdense intraconal tubular structure of the inferolateral left orbit (best seen on coronal series 6, image 19). Other: Mastoid air cells are clear. ASPECTS (Alberta Stroke Program Early CT Score) - Ganglionic level infarction (caudate, lentiform nuclei, internal capsule, insula, M1-M3 cortex): 7 - Supraganglionic infarction (M4-M6 cortex): 3 Total score (0-10 with 10 being normal): 10 IMPRESSION: There is no acute intracranial hemorrhage or evidence of acute infarction. ASPECT score is 10. New mildly hyperdense intraconal tubular structure of the inferolateral left orbit. 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 Stable chronic/nonemergent findings detailed above. These results were communicated to Dr. Amada Jupiter at 2:56 pm on 03/19/2021 by text page via the [facility] messaging system. Electronically Signed   By: Guadlupe Spanish M.D.   On: 03/19/2021 15:02    EKG None  Radiology CT HEAD CODE STROKE WO CONTRAST  Result Date: 03/19/2021 CLINICAL DATA:  Code stroke. EXAM: CT HEAD WITHOUT CONTRAST TECHNIQUE: Contiguous axial images were obtained from the base of the skull through the vertex without intravenous contrast. COMPARISON:  01/21/2021 FINDINGS: Brain: No acute intracranial hemorrhage, mass effect, or edema. No new loss of gray-white differentiation. Chronic infarct of the right basal ganglia and adjacent white matter. Fluent areas of hypoattenuation in the supratentorial white matter likely reflects stable chronic microvascular ischemic changes. Ventricles and sulci are stable in size and configuration. No extra-axial collection. Vascular: No hyperdense vessel. There is intracranial  atherosclerotic calcification at the skull base. Skull: Unremarkable. Sinuses/Orbits: New mildly hyperdense intraconal tubular structure of the inferolateral left orbit (best seen on coronal series 6, image 19). Other: Mastoid air cells are clear. ASPECTS (Alberta Stroke Program Early CT Score) - Ganglionic level infarction (caudate, lentiform nuclei, internal capsule, insula, M1-M3 cortex): 7 - Supraganglionic infarction (M4-M6 cortex): 3 Total score (0-10 with 10 being normal): 10 IMPRESSION: There is no acute intracranial hemorrhage or evidence of acute infarction. ASPECT score is 10. New mildly hyperdense intraconal tubular structure of the inferolateral left orbit. 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 Stable chronic/nonemergent findings detailed above. These results were communicated to Dr. Amada Jupiter at 2:56 pm on 03/19/2021 by text page via the [facility] messaging system. Electronically Signed   By: Guadlupe Spanish M.D.   On: 03/19/2021 15:02    Procedures Procedures   Medications Ordered in ED Medications  iopamidol (ISOVUE-300) 61 % injection 100 mL (100 mLs Intravenous Contrast Given 03/19/21 1548)    ED Course  I have reviewed the triage vital signs and the nursing notes.  Pertinent labs & imaging results that were available during my care of the patient were reviewed by me and considered in my medical decision making (see chart for details).    MDM Rules/Calculators/A&P                          Iv ns. Continuous pulse ox and cardiac monitoring. Stat ct. Pt arrived as code stroke activation and neurology evaluated on arrival.  Reviewed nursing notes and prior charts for additional history.  Admission last month w similar symptoms - with 'metabolic encephalopathy', possible uti then.   CT reviewed/interpreted by me - no hem.   Labs reviewed/interpreted by me - wbc normal, hgb normal.  hco3 sl low - ?volume depletion.  Iv ns bolus. UA and additional labs pending.   Neurology indicates ct  and perfusion imaging without acute cva, and recommends admit to medicine.   UA/labs pending - signed out to Dr Silverio Lay, to f/u on pending labs, and consult medicine service for admission.    Final Clinical Impression(s) / ED Diagnoses Final diagnoses:  None    Rx / DC Orders ED Discharge Orders    None       Cathren Laine, MD 03/19/21 1622

## 2021-03-19 NOTE — ED Triage Notes (Signed)
DAUGHTER FOUND IN CHAIR TODAY AT APPROXIMATELY 1400 SITTING IN CHAIR AND STARRING, UNRESPONSIVE TO VERBAL.

## 2021-03-19 NOTE — ED Notes (Signed)
EEG in process

## 2021-03-19 NOTE — ED Notes (Signed)
Roomed from CT

## 2021-03-19 NOTE — ED Notes (Signed)
NEUROLOGIST ANS STROKE TEAM AT BEDSIDE.

## 2021-03-19 NOTE — ED Notes (Signed)
MD at bedside. 

## 2021-03-20 ENCOUNTER — Encounter (HOSPITAL_COMMUNITY): Payer: Self-pay | Admitting: Family Medicine

## 2021-03-20 ENCOUNTER — Other Ambulatory Visit: Payer: Self-pay

## 2021-03-20 ENCOUNTER — Inpatient Hospital Stay (HOSPITAL_COMMUNITY): Payer: Medicare Other

## 2021-03-20 DIAGNOSIS — N1832 Chronic kidney disease, stage 3b: Secondary | ICD-10-CM | POA: Diagnosis not present

## 2021-03-20 DIAGNOSIS — R4182 Altered mental status, unspecified: Secondary | ICD-10-CM | POA: Diagnosis not present

## 2021-03-20 DIAGNOSIS — I10 Essential (primary) hypertension: Secondary | ICD-10-CM | POA: Diagnosis not present

## 2021-03-20 LAB — CBC
HCT: 35.9 % — ABNORMAL LOW (ref 36.0–46.0)
Hemoglobin: 11.4 g/dL — ABNORMAL LOW (ref 12.0–15.0)
MCH: 29.9 pg (ref 26.0–34.0)
MCHC: 31.8 g/dL (ref 30.0–36.0)
MCV: 94.2 fL (ref 80.0–100.0)
Platelets: 256 10*3/uL (ref 150–400)
RBC: 3.81 MIL/uL — ABNORMAL LOW (ref 3.87–5.11)
RDW: 14.6 % (ref 11.5–15.5)
WBC: 8.7 10*3/uL (ref 4.0–10.5)
nRBC: 0 % (ref 0.0–0.2)

## 2021-03-20 LAB — COMPREHENSIVE METABOLIC PANEL
ALT: 13 U/L (ref 0–44)
AST: 19 U/L (ref 15–41)
Albumin: 3.7 g/dL (ref 3.5–5.0)
Alkaline Phosphatase: 99 U/L (ref 38–126)
Anion gap: 10 (ref 5–15)
BUN: 30 mg/dL — ABNORMAL HIGH (ref 8–23)
CO2: 23 mmol/L (ref 22–32)
Calcium: 9.3 mg/dL (ref 8.9–10.3)
Chloride: 107 mmol/L (ref 98–111)
Creatinine, Ser: 1.29 mg/dL — ABNORMAL HIGH (ref 0.44–1.00)
GFR, Estimated: 45 mL/min — ABNORMAL LOW (ref >60)
Glucose, Bld: 97 mg/dL (ref 70–99)
Potassium: 4.6 mmol/L (ref 3.5–5.1)
Sodium: 140 mmol/L (ref 135–145)
Total Bilirubin: 0.8 mg/dL (ref 0.3–1.2)
Total Protein: 7.4 g/dL (ref 6.5–8.1)

## 2021-03-20 LAB — VITAMIN B12: Vitamin B-12: 1120 pg/mL — ABNORMAL HIGH (ref 180–914)

## 2021-03-20 LAB — HEMOGLOBIN A1C
Hgb A1c MFr Bld: 5.7 % — ABNORMAL HIGH (ref 4.8–5.6)
Mean Plasma Glucose: 117 mg/dL

## 2021-03-20 LAB — LIPID PANEL
Cholesterol: 190 mg/dL (ref 0–200)
HDL: 80 mg/dL (ref >40)
LDL Cholesterol: 103 mg/dL — ABNORMAL HIGH (ref 0–99)
Total CHOL/HDL Ratio: 2.4 RATIO
Triglycerides: 35 mg/dL (ref <150)
VLDL: 7 mg/dL (ref 0–40)

## 2021-03-20 LAB — TSH: TSH: 1.098 u[IU]/mL (ref 0.350–4.500)

## 2021-03-20 MED ORDER — CARVEDILOL 12.5 MG PO TABS
25.0000 mg | ORAL_TABLET | Freq: Two times a day (BID) | ORAL | Status: DC
  Administered 2021-03-20 – 2021-03-21 (×3): 25 mg via ORAL
  Filled 2021-03-20 (×3): qty 2

## 2021-03-20 MED ORDER — PANTOPRAZOLE SODIUM 40 MG PO TBEC
40.0000 mg | DELAYED_RELEASE_TABLET | Freq: Every day | ORAL | Status: DC
Start: 2021-03-20 — End: 2021-03-21
  Administered 2021-03-20 – 2021-03-21 (×2): 40 mg via ORAL
  Filled 2021-03-20 (×2): qty 1

## 2021-03-20 MED ORDER — DICLOFENAC SODIUM 1 % EX GEL
2.0000 g | Freq: Three times a day (TID) | CUTANEOUS | Status: DC
  Administered 2021-03-20 – 2021-03-21 (×5): 2 g via TOPICAL
  Filled 2021-03-20: qty 100

## 2021-03-20 MED ORDER — LACTULOSE 10 GM/15ML PO SOLN: 10.0000 g | Freq: Every day | ORAL | Status: DC | PRN

## 2021-03-20 NOTE — Progress Notes (Signed)
Orthopedic Tech Progress Note Patient Details:  Michelle Graves 06-Sep-1951 [id number]  Ortho Devices Type of Ortho Device: Prafo boot/shoe Ortho Device/Splint Location: RLE Ortho Device/Splint Interventions: Ordered,Application,Adjustment   Post Interventions Patient Tolerated: Well Instructions Provided: Care of device   [name] 03/20/2021, 3:19 PM

## 2021-03-20 NOTE — Progress Notes (Signed)
SLP Cancellation Note  Patient Details Name: Michelle Graves MRN: [id number] DOB: [birth year]   Cancelled treatment:       Reason Eval/Treat Not Completed: SLP screened. Pt passed [facility] and placed on regular diet. Pt and RN report no identified needs for SLP services at this time, will sign off.    Avie Echevaria, MA, CCC-SLP Acute Rehabilitation Services Office Number: [phone]  Paulette Blanch 03/20/2021, 9:28 AM

## 2021-03-20 NOTE — Procedures (Signed)
Patient Name: Michelle Graves  MRN: [id number]  Epilepsy Attending: Charlsie Quest  Referring Physician/Provider: Jimmye Norman, NP Date: 03/19/2021 Duration: 24.50 mins  Patient history: 70 year old female with altered mental status and.  EEG to evaluate for seizures.  Level of alertness: Awake  AEDs during EEG study: None  Technical aspects: This EEG study was done with scalp electrodes positioned according to the 10-20 International system of electrode placement. Electrical activity was acquired at a sampling rate of 500Hz  and reviewed with a high frequency filter of 70Hz  and a low frequency filter of 1Hz . EEG data were recorded continuously and digitally stored.   Description: EEG showed continuous generalized 3 to 6 Hz theta-delta slowing. Hyperventilation and photic stimulation were not performed.     ABNORMALITY - Continuous slow, generalized  IMPRESSION: This study is suggestive of moderate diffuse encephalopathy, nonspecific etiology. No seizures or epileptiform discharges were seen throughout the recording.  Rosalio Catterton 

## 2021-03-20 NOTE — Progress Notes (Signed)
Family Medicine Teaching Service Daily Progress Note Intern Pager: [phone]  Patient name: Michelle Graves Medical record number: [id number] Date of birth: 07/14/1951 Age: 70 y.o. Gender: female  Primary Care Provider: [facility]: Neurology Code Status: Full  Pt Overview and Major Events to Date:  03/20/2021: Admitted to FPTS  Assessment and Plan: Michelle Graves is a 70 y.o. female presenting with altered mental status . PMH is significant for stroke in 2020, encephalopathy, HTN, CKD, GAD, depression, multi nodular goiter, RA, Sjogren's, hx bowel obstruction, lumbar spondylosis.   AMS -Improving Patient is alert, awake, oriented to self and place this morning.  She is unable to give a clear and coherent history but no signs of reflux noted.  Speech is + expressive aphasia, word finding difficulties but able to follow commands.  Naming, fluency and repetition not intact and comprehension is slowed at times.  CT scan and MRI does not show any acute stroke.  Patient's daughter report any expressive aphasia at baseline and as per neuro outpatient notes also.  TSH 1.098, blood glucose 97 WBC 8.7, UDS negative, urine analysis normal, lactic acid 0.8, blood alcohol level less than 10.  EKG shows moderate diffuse encephalopathy, nonspecific etiology but no seizures or epileptiform discharges.  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 --Neurology following, appreciate assistance --Vitals per floor -- Frequent neurochecks --c/w Plavix --Control blood pressure --Seizure precautions --PT eval and treat --OT eval and treat --Heart healthy diet --f/u Vit B12 --Delirium precautions --Ankle foot orthosis  Hypertensive urgency Patient had blood pressure  of 188/113 in ED. As per daughter patient did not take her medication yesterday. now it is 165/118.  Home medications: Carvedilol 25 mg twice daily, diltiazem 180 mg BID.  Per neurology if MRI is negative for stroke patient can be restarted on her blood pressure medication and should be treated like hypertensive urgency -- Labetalol as needed if BP>220/110 -- c/w coreg 25 mg BID -- Hold Diltiazem 180 mg BID  ? Left leg dependent edema around knee I R foot drop As per daughter, it is chronic and has not tried anything in the past to relieve it.  Picture in media -- Continue to monitor --Ankle foot orthosis for R foot   Chronic kidney disease III Cr 1.29 from 1.45. GFR 45. Around baseline which seems to be around 1.3-1.5. -- continue to monitor --am BMP -- avoid nephrotoxic agents   HLD LDL 93 on 12/16/20. Goal < /dl  Home medication: Lipitor   -- continue Lipitor    Rheumatoid arthritis  Home medication: Hydroxychloroquine  daily -- continue home med   Lower back pain I Hx L5 compression fractures Home meds: Robaxin 500 mg --Hold Robaxin 500 mg q8h PRN     FEN/GI: Heart healthy diet PPx: Lovenox 40 mg   Status is: Inpatient  Remains inpatient appropriate because:Altered mental status   Dispo: The patient is from: Home              Anticipated d/c is to: Home              Patient currently is not medically stable to d/c.   Difficult to place patient No        Subjective:  No acute overnight events. Patient evaluated at bedside this  morning.  She is awake, alert, see below for full neurological exam.  Denies any complaint and feels better this morning.  Objective: Temp:  [97.9 F (36.6 C)-98.4 F (36.9 C)] 98.4 F (36.9 C) (05/24 2215) Pulse Rate:  [77-100] 84 (05/25 0812) Resp:  [12-24] 18 (05/25 0812) BP: (150-188)/(100-137) 159/106 (05/25 1000) SpO2:  [94 %-100 %] 100 % (05/25 8185) Physical Exam: General: Well-developed elderly  lady, lying comfortably in bed, NAD Cardiovascular: RRR, no murmurs, rubs, gallops Respiratory: Clear to auscultation bilaterally Abdomen: Soft, nontender, nondistended Extremities: Left leg has dependent edema around knee, chronic as per daughter., Picture in media Mental Status: Alert, oriented to self and place, unable to state her age (55).  Speech + for expressive aphasia.  Able to follow 3 step commands without difficulty.  Naming, fluency, repetition not intact and comprehension is slowed at times.  Unable to remember 3 words, cannot spell world backwards. Cranial Nerves: II:  Visual fields grossly normal, pupils equal, round, reactive to light and accommodation III,IV, VI: ptosis not present, extra-ocular motions intact bilaterally V,VII: smile symmetric, facial light touch sensation normal bilaterally VIII: hearing normal bilaterally IX,X: uvula rises symmetrically XI: bilateral shoulder shrug XII: midline tongue extension without atrophy or fasciculations  Motor: Right : Upper extremity   5/5    Left:     Upper extremity   4/5  Lower extremity   5/5     Lower extremity   4/5 Tone and bulk:normal tone throughout; no atrophy noted Sensory: Pinprick and light touch intact throughout, bilaterally  Plantars: R Foot drop   Left: downgoing  Gait: Deferred    Laboratory: Recent Labs  Lab 03/19/21 1444 03/19/21 1449 03/20/21 0323  WBC 10.2  --  8.7  HGB 12.0 12.9 11.4*  HCT 37.6 38.0 35.9*  PLT 208  --  256   Recent Labs  Lab 03/19/21 1444 03/19/21 1449 03/20/21 0323  NA 140 139 140  K 4.7 4.5 4.6  CL 107 111 107  CO2 20*  --  23  BUN 37* 38* 30*  CREATININE 1.45* 1.40* 1.29*  CALCIUM 9.3  --  9.3  PROT 7.3  --  7.4  BILITOT 0.9  --  0.8  ALKPHOS 98  --  99  ALT 10  --  13  AST 15  --  19  GLUCOSE 100* 101* 97    Imaging/Diagnostic Tests: MR BRAIN WO CONTRAST  Result Date: 03/20/2021 CLINICAL DATA:  Initial evaluation for neuro deficit, stroke  suspected. EXAM: MRI HEAD WITHOUT CONTRAST TECHNIQUE: Multiplanar, multiecho pulse sequences of the brain and surrounding structures were obtained without intravenous contrast. COMPARISON:  Prior CT from 03/19/2021 FINDINGS: Brain: Examination mildly degraded by motion artifact. Cerebral volume within normal limits for age. Extensive patchy and confluent T2/FLAIR hyperintensity seen throughout the periventricular and deep white matter both cerebral hemispheres, most like related to advanced chronic microvascular ischemic disease. Mild patchy involvement of the pons. Multiple superimposed remote lacunar infarcts present about the right basal ganglia and thalami. Few small remote left cerebellar infarcts. No abnormal foci of restricted diffusion to suggest acute or subacute ischemia. Gray-white matter differentiation maintained. No other areas of remote cortical infarction. No acute intracranial hemorrhage. Multiple chronic micro hemorrhages noted clustered about the cerebellum, brainstem, and deep gray nuclei, likely related to chronic poorly controlled hypertension. No mass lesion, midline shift or mass effect. No hydrocephalus or extra-axial fluid collection. Pituitary gland suprasellar region normal. Vascular: Major intracranial vascular flow voids are maintained. Skull and  upper cervical spine: Craniocervical junction within normal limits. Bone marrow signal intensity normal. No scalp soft tissue abnormality. Sinuses/Orbits: Globes and orbital soft tissues within normal limits. Mild scattered mucosal thickening noted about the ethmoidal air cells. Paranasal sinuses are otherwise clear. No significant mastoid effusion. Inner ear structures grossly normal. Other: None. IMPRESSION: 1. No acute intracranial abnormality. 2. Advanced chronic microvascular ischemic disease with multiple remote lacunar infarcts involving the right basal ganglia, thalami, and left cerebellum. 3. Multiple chronic micro hemorrhages  clustered about the cerebellum, brainstem, and deep gray nuclei, likely related to chronic poorly controlled hypertension. Electronically Signed   By: Rise Mu M.D.   On: 03/20/2021 03:08   DG Chest Port 1 View  Result Date: 03/19/2021 CLINICAL DATA:  Mental status changes.  Weakness. EXAM: PORTABLE CHEST 1 VIEW COMPARISON:  01/21/2021 FINDINGS: Chronic cardiomegaly. Aortic tortuosity. The lungs are clear. No infiltrate, collapse or effusion. No pulmonary edema. IMPRESSION: Chronic cardiomegaly.  Aortic tortuosity.  No active disease. Electronically Signed   By: Paulina Fusi M.D.   On: 03/19/2021 16:53   EEG adult  Result Date: 03/20/2021 Charlsie Quest, MD     03/20/2021  8:46 AM Patient Name: SKYLEE BAIRD MRN: [id number] Epilepsy Attending: Charlsie Quest Referring Physician/Provider: Jimmye Norman, NP Date: 03/19/2021 Duration: 24.50 mins Patient history: 70 year old female with altered mental status and.  EEG to evaluate for seizures. Level of alertness: Awake AEDs during EEG study: None Technical aspects: This EEG study was done with scalp electrodes positioned according to the 10-20 International system of electrode placement. Electrical activity was acquired at a sampling rate of 500Hz  and reviewed with a high frequency filter of 70Hz  and a low frequency filter of 1Hz . EEG data were recorded continuously and digitally stored. Description: EEG showed continuous generalized 3 to 6 Hz theta-delta slowing. Hyperventilation and photic stimulation were not performed.   ABNORMALITY - Continuous slow, generalized IMPRESSION: This study is suggestive of moderate diffuse encephalopathy, nonspecific etiology. No seizures or epileptiform discharges were seen throughout the recording.   CT HEAD CODE STROKE WO CONTRAST  Result Date: 03/19/2021 CLINICAL DATA:  Code stroke. EXAM: CT HEAD WITHOUT CONTRAST TECHNIQUE: Contiguous axial images were obtained from the base of the  skull through the vertex without intravenous contrast. COMPARISON:  01/21/2021 FINDINGS: Brain: No acute intracranial hemorrhage, mass effect, or edema. No new loss of gray-white differentiation. Chronic infarct of the right basal ganglia and adjacent white matter. Fluent areas of hypoattenuation in the supratentorial white matter likely reflects stable chronic microvascular ischemic changes. Ventricles and sulci are stable in size and configuration. No extra-axial collection. Vascular: No hyperdense vessel. There is intracranial atherosclerotic calcification at the skull base. Skull: Unremarkable. Sinuses/Orbits: New mildly hyperdense intraconal tubular structure of the inferolateral left orbit (best seen on coronal series 6, image 19). Other: Mastoid air cells are clear. ASPECTS (Alberta Stroke Program Early CT Score) - Ganglionic level infarction (caudate, lentiform nuclei, internal capsule, insula, M1-M3 cortex): 7 - Supraganglionic infarction (M4-M6 cortex): 3 Total score (0-10 with 10 being normal): 10 IMPRESSION: There is no acute intracranial hemorrhage or evidence of acute infarction. ASPECT score is 10. New mildly hyperdense intraconal tubular structure of the inferolateral left orbit. 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 Stable chronic/nonemergent findings detailed above. These results were communicated to Dr. Charlsie Quest at 2:56 pm on 03/19/2021 by text page via the [facility] messaging system. Electronically Signed   By: Amada Jupiter M.D.   On: 03/19/2021 15:02   CT  ANGIO HEAD NECK W WO CM W PERF (CODE STROKE)  Result Date: 03/19/2021 CLINICAL DATA:  Left-sided weakness EXAM: CT ANGIOGRAPHY HEAD AND NECK TECHNIQUE: Multidetector CT imaging of the head and neck was performed using the standard protocol during bolus administration of intravenous contrast. Multiplanar CT image reconstructions and MIPs were obtained to evaluate the vascular anatomy. Carotid stenosis measurements (when applicable) are obtained  utilizing NASCET criteria, using the distal internal carotid diameter as the denominator. CONTRAST:  ISOVUE-300 IOPAMIDOL (ISOVUE-300) INJECTION 61% COMPARISON:  None. FINDINGS: CTA NECK Aortic arch: Great vessel origins are patent. Right carotid system: Patent. Minimal calcified plaque at the ICA origin without stenosis. Retropharyngeal course of the ICA with the vessel extending as far medial as midline. Left carotid system: Patent. Minimal calcified plaque along the proximal internal carotid without stenosis. Vertebral arteries: Patent. Left vertebral artery is dominant. No stenosis. Skeleton: Degenerative changes of the cervical spine. Other neck: Heterogeneous thyroid, recent evaluated by ultrasound. Upper chest: No apical lung mass.  Scarring at the apices. Review of the MIP images confirms the above findings CTA HEAD Anterior circulation: Intracranial internal carotid arteries are patent with mild calcified plaque. Superiorly directed outpouching at the right ophthalmic artery origin measuring 2 x 1.6 mm (on sagittal series 10, image 92). There is an additional 1 mm posterosuperior directed outpouching at the aneurysm apex. A shallow, broad-based 1.1 x 1.5 mm (series 10, image 109) inferiorly directed outpouching is present at the distal supraclinoid left ICA. Anterior and middle cerebral arteries are patent. Posterior circulation: Intracranial vertebral arteries are patent. There is moderate stenosis of the distal right vertebral artery. Basilar artery is patent. Major cerebellar artery origins are patent. Posterior cerebral arteries are patent. Venous sinuses: Patent as allowed by contrast bolus timing. Review of the MIP images confirms the above findings IMPRESSION: No large vessel occlusion or hemodynamically significant stenosis. 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 Electronically Signed   By: Guadlupe Spanish M.D.   On:  03/19/2021 16:12    Renard Caperton, Geralynn Rile, MD 03/20/2021, 11:05 AM PGY-1, [facility] Family Medicine FPTS Intern pager: [phone], text pages welcome

## 2021-03-20 NOTE — Evaluation (Signed)
Physical Therapy Evaluation Patient Details Name: Michelle Graves MRN: [id number] DOB: [birth year] Today's Date: 03/20/2021   History of Present Illness  70 y/o female presented to ED on 5/23 for AMS, EEG suggestive of diffuse encephalopathy. MRI brain negative for acute CVA; shows chronic micro hemorrhages clustered about the cerebellum, brainstem, and deep gray nuclei. CTA shows 48mm R periophthalmic aneurysm with 1 mm daughter lobule, sub 54mm distal supraclinoid L ICA aneursym. Recently hospitalized 1/31-2/5, 2/12-2/15, 2/19-2/23, 3/28-4/6 for UTIs and encephalopathy, as well as subacute L cereballar CVA at 2/19 stay. PMH: cerebellar stroke, HTN, recurrent UTI, diverticulitis, anemia, CKD, RA,depression.  Clinical Impression   Pt presents with generalized weakness, impaired balance, mildly AMS but unsure of pt baseline, and decreased activity tolerance vs baseline. Pt to benefit from acute PT to address deficits. Pt ambulated short hallway distance with RW, requires close guard for form and safety. PT recommending HHPT and increased supervision initially at d/c from family. HRMax 127 bpm during gait, RN aware. PT to progress mobility as tolerated, and will continue to follow acutely.      Follow Up Recommendations Home health PT;Supervision for mobility/OOB    Equipment Recommendations  None recommended by PT    Recommendations for Other Services       Precautions / Restrictions Precautions Precautions: Fall Precaution Comments: history of urinary incontinence Restrictions Weight Bearing Restrictions: No      Mobility  Bed Mobility Overal bed mobility: Needs Assistance Bed Mobility: Supine to Sit     Supine to sit: Min assist;HOB elevated     General bed mobility comments: min assist for scooting to EOB, very increased time to perform.    Transfers Overall transfer level: Needs assistance Equipment used: Rolling walker (2 wheeled) Transfers: Sit to/from Stand Sit to  Stand: Min guard Stand pivot transfers: Min guard       General transfer comment: for safety, increased time to rise with VC for hand placement when rising/sitting.  Ambulation/Gait Ambulation/Gait assistance: Min guard Gait Distance (Feet): 105 Feet Assistive device: Rolling walker (2 wheeled) Gait Pattern/deviations: Step-through pattern;Decreased stride length;Trunk flexed Gait velocity: decr   General Gait Details: Min guard for safety, verbal cuing for upright posture x2 with additional cues for placement in RW. HR 127 bpm during gait, RN notified.  Stairs            Wheelchair Mobility    Modified Rankin (Stroke Patients Only)       Balance Overall balance assessment: Needs assistance Sitting-balance support: No upper extremity supported;Feet supported Sitting balance-[name]: Good     Standing balance support: Bilateral upper extremity supported;During functional activity Standing balance-[name]: Poor Standing balance comment: reliant on external support                             Pertinent Vitals/Pain Pain Assessment: No/denies pain    Home Living Family/patient expects to be discharged to:: Private residence Living Arrangements: Alone Available Help at Discharge: Family;Available PRN/intermittently Type of Home: Apartment Home Access: Elevator     Home Layout: One level Home Equipment: Walker - 2 wheels;Walker - 4 wheels;Cane - quad;Shower seat;Bedside commode;Wheelchair - manual;Grab bars - toilet;Grab bars - tub/shower;Toilet riser;Other (comment)      Prior Function Level of Independence: Independent with assistive device(s);Needs assistance   Gait / Transfers Assistance Needed: rollator in community, RW vs. SPC in house  ADL's / Homemaking Assistance Needed: Assist with IADLs including meal prep, med management (  daughter) and driving (son). Patient able to dress/bathe without assist at baseline.        Hand Dominance    Dominant Hand: Left    Extremity/Trunk Assessment   Upper Extremity Assessment Upper Extremity Assessment: Defer to OT evaluation    Lower Extremity Assessment Lower Extremity Assessment: Generalized weakness    Cervical / Trunk Assessment Cervical / Trunk Assessment: Normal  Communication   Communication: No difficulties  Cognition Arousal/Alertness: Awake/alert Behavior During Therapy: WFL for tasks assessed/performed Overall Cognitive Status: No family/caregiver present to determine baseline cognitive functioning Area of Impairment: Attention;Following commands;Safety/judgement;Problem solving                   Current Attention Level: Selective   Following Commands: Follows one step commands consistently Safety/Judgement: Decreased awareness of safety;Decreased awareness of deficits   Problem Solving: Requires verbal cues;Requires tactile cues General Comments: A&Ox4, uses humor frequently throughout session at times suspect to cover deficits. Pt's bed wet with urine upon arrival to room, pt with no awareness of this.      General Comments      Exercises General Exercises - Lower Extremity Long Arc Quad: AROM;Both;5 reps;Seated   Assessment/Plan    PT Assessment Patient needs continued PT services  PT Problem List Decreased strength;Decreased activity tolerance;Decreased mobility;Decreased balance;Decreased cognition;Decreased safety awareness;Decreased knowledge of use of DME       PT Treatment Interventions DME instruction;Gait training;Functional mobility training;Therapeutic activities;Therapeutic exercise;Balance training;Patient/family education    PT Goals (Current goals can be found in the Care Plan section)  Acute Rehab PT Goals Patient Stated Goal: To return home. PT Goal Formulation: With patient Time For Goal Achievement: 04/03/21 Potential to Achieve Goals: Good    Frequency Min 3X/week   Barriers to discharge         Co-evaluation               AM-PAC PT "6 Clicks" Mobility  Outcome Measure Help needed turning from your back to your side while in a flat bed without using bedrails?: A Little Help needed moving from lying on your back to sitting on the side of a flat bed without using bedrails?: A Little Help needed moving to and from a bed to a chair (including a wheelchair)?: A Little Help needed standing up from a chair using your arms (e.g., wheelchair or bedside chair)?: A Little Help needed to walk in hospital room?: A Little Help needed climbing 3-5 steps with a railing? : A Little 6 Click Score: 18    End of Session Equipment Utilized During Treatment: Gait belt Activity Tolerance: Patient limited by fatigue Patient left: in chair;with chair alarm set;with call bell/phone within reach Nurse Communication: Mobility status;Other (comment) (tachycardia to 127 bpm during gait) PT Visit Diagnosis: Unsteadiness on feet (R26.81);Muscle weakness (generalized) (M62.81);Difficulty in walking, not elsewhere classified (R26.2)    Time: 2094-7096 PT Time Calculation (min) (ACUTE ONLY): 27 min   Charges:   PT Evaluation $PT Eval Low Complexity: 1 Low PT Treatments $Gait Training: 8-22 mins       Marye Round, PT DPT Acute Rehabilitation Services Pager [phone]  Office [phone]   Tyrone Apple E Christain [place] 03/20/2021, 4:10 PM

## 2021-03-20 NOTE — Evaluation (Signed)
Occupational Therapy Evaluation Patient Details Name: Michelle Graves MRN: [id number] DOB: [birth year] Today's Date: 03/20/2021    History of Present Illness 70 y/o female presented to ED on 5/23 for AMS, EEG suggestive of diffuse encephalopathy. MRI brain negative for acute CVA; shows chronic micro hemorrhages clustered about the cerebellum, brainstem, and deep gray nuclei. 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 Recently hospitalized 1/31-2/5, 2/12-2/15, 2/19-2/23, 3/28-4/6 for UTIs and encephalopathy, as well as subacute L cereballar CVA at 2/19 stay. PMH: cerebellar stroke, HTN, recurrent UTI, diverticulitis, anemia, CKD, RA,depression.   Clinical Impression   Patient admitted for the diagnosis above.  PTA she lives alone in an apartment with community mobility assist from her son, and assist for meals, meds, bill payment and home management from her daughter.  She states she uses a cane in the home and a 4WRW in the community for mobility.  Barriers are listed below.  Currently she is needing up to [facility] for basic mobility at a 2WRW level, and up to Min A for ADL completion.  Patient may need additional supports at home for safe ADL completion, but home with home health services is recommended.  OT will continue to follow in the acute setting to maximize functional status.      Follow Up Recommendations  Home health OT;Supervision - Intermittent    Equipment Recommendations  None recommended by OT    Recommendations for Other Services       Precautions / Restrictions Precautions Precautions: Fall Precaution Comments: history of urinary incontinence Restrictions Weight Bearing Restrictions: No      Mobility Bed Mobility Overal bed mobility: Needs Assistance Bed Mobility: Sit to Supine     Supine to sit: Min assist;HOB elevated Sit to supine: Min guard   General bed mobility comments: min assist for scooting to  EOB, very increased time to perform. Patient Response: Cooperative  Transfers Overall transfer level: Needs assistance Equipment used: Rolling walker (2 wheeled) Transfers: Sit to/from [employer] Sit to Stand: Min guard Stand pivot transfers: Min guard       General transfer comment: decreased safety awareness    Balance Overall balance assessment: Needs assistance Sitting-balance support: No upper extremity supported;Feet supported Sitting balance-[name]: Good     Standing balance support: Bilateral upper extremity supported Standing balance-[name]: Poor Standing balance comment: reliant on external support                           ADL either performed or assessed with clinical judgement   ADL Overall ADL's : Needs assistance/impaired     Grooming: Wash/dry hands;Wash/dry face;Set up;Sitting           Upper Body Dressing : Minimal assistance;Sitting   Lower Body Dressing: Minimal assistance;Sit to/from stand   Toilet Transfer: Min guard;RW   Toileting- Architect and Hygiene: Supervision/safety;Sit to/from stand       Functional mobility during ADLs: Min guard;Rolling walker       Vision Patient Visual Report: No change from baseline       Perception  WFL   Praxis  [facility]    Pertinent Vitals/Pain Pain Assessment: No/denies pain Pain Intervention(s): Monitored during session     Hand Dominance Left   Extremity/Trunk Assessment Upper Extremity Assessment Upper Extremity Assessment: Generalized weakness (LUE weaker than RUE)   Lower Extremity Assessment Lower Extremity Assessment: Defer to PT evaluation  Cervical / Trunk Assessment Cervical / Trunk Assessment: Normal   Communication Communication Communication: No difficulties   Cognition Arousal/Alertness: Awake/alert Behavior During Therapy: WFL for tasks assessed/performed Overall Cognitive Status: No family/caregiver present to determine  baseline cognitive functioning Area of Impairment: Attention;Following commands;Safety/judgement;Problem solving                 Orientation Level: Disoriented to;Situation Current Attention Level: Selective Memory: Decreased short-term memory Following Commands: Follows one step commands consistently Safety/Judgement: Decreased awareness of safety;Decreased awareness of deficits Awareness: Intellectual Problem Solving: Requires verbal cues;Requires tactile cues General Comments: A&Ox4, uses humor frequently throughout session at times suspect to cover deficits. Pt's bed wet with urine upon arrival to room, pt with no awareness of this.   General Comments       Exercises General Exercises - Lower Extremity Long Arc Quad: AROM;Both;5 reps;Seated   Shoulder Instructions      Home Living Family/patient expects to be discharged to:: Private residence Living Arrangements: Alone Available Help at Discharge: Family;Available PRN/intermittently Type of Home: Apartment Home Access: Elevator     Home Layout: One level     Bathroom Shower/Tub: Chief Strategy Officer: Handicapped height Bathroom Accessibility: Yes How Accessible: Accessible via walker Home Equipment: Walker - 2 wheels;Walker - 4 wheels;Cane - quad;Shower seat;Bedside commode;Wheelchair - manual;Grab bars - toilet;Grab bars - tub/shower;Toilet riser;Other (comment)          Prior Functioning/Environment Level of Independence: Independent with assistive device(s);Needs assistance  Gait / Transfers Assistance Needed: rollator in community, RW vs. SPC in house ADL's / Homemaking Assistance Needed: Assist with IADLs including meal prep, med management (daughter) and driving (son). Patient able to dress/bathe without assist at baseline.            OT Problem List: Decreased strength;Decreased activity tolerance;Impaired balance (sitting and/or standing);Decreased cognition;Decreased safety  awareness;Decreased knowledge of use of DME or AE      OT Treatment/Interventions: Self-care/ADL training;Therapeutic exercise;Energy conservation;DME and/or AE instruction;Therapeutic activities;Cognitive remediation/compensation;Patient/family education;Balance training    OT Goals(Current goals can be found in the care plan section) Acute Rehab OT Goals Patient Stated Goal: To return home. OT Goal Formulation: With patient Time For Goal Achievement: 04/03/21 Potential to Achieve Goals: Good ADL Goals Pt Will Perform Grooming: with set-up;standing Pt Will Perform Upper Body Bathing: with set-up;standing;sitting Pt Will Perform Lower Body Bathing: with set-up;sit to/from stand Pt Will Perform Upper Body Dressing: with set-up;sitting;standing Pt Will Perform Lower Body Dressing: with set-up;sit to/from stand Pt Will Transfer to Toilet: with modified independence;ambulating;regular height toilet Pt Will Perform Toileting - Clothing Manipulation and hygiene: with modified independence;sit to/from stand  OT Frequency: Min 2X/week   Barriers to D/C:    none noted       Co-evaluation              AM-PAC OT "6 Clicks" Daily Activity     Outcome Measure Help from another person eating meals?: None Help from another person taking care of personal grooming?: A Little Help from another person toileting, which includes using toliet, bedpan, or urinal?: A Little Help from another person bathing (including washing, rinsing, drying)?: A Little Help from another person to put on and taking off regular upper body clothing?: A Little Help from another person to put on and taking off regular lower body clothing?: A Little 6 Click Score: 19   End of Session Equipment Utilized During Treatment: Gait belt;Rolling walker  Activity Tolerance: Patient tolerated treatment well Patient left: in  bed;with bed alarm set;with call bell/phone within reach  OT Visit Diagnosis: Unsteadiness on feet  (R26.81);Muscle weakness (generalized) (M62.81);History of falling (Z91.81)                Time: 5726-2035 OT Time Calculation (min): 22 min Charges:  OT General Charges $OT Visit: 1 Visit OT Evaluation $OT Eval Moderate Complexity: 1 Mod  03/20/2021  Rich, OTR/L  Acute Rehabilitation Services  Office:  [phone]   Suzanna Obey 03/20/2021, 4:56 PM

## 2021-03-20 NOTE — Progress Notes (Signed)
Orthopedic Tech Progress Note Patient Details:  Michelle Graves October 28, 1950 [id number] Called in order to [facility] for an AFO Patient ID: Michelle Graves, female   DOB: [birth year], 70 y.o.   MRN: [id number]   [name] 03/20/2021, 10:33 AM

## 2021-03-20 NOTE — Progress Notes (Signed)
Subjective: Greatly improved  Exam: Vitals:   03/20/21 0812 03/20/21 1000  BP: (!) 165/118 (!) 159/106  Pulse: 84   Resp: 18   Temp:    SpO2: 100%    Gen: In bed, NAD Resp: non-labored breathing, no acute distress Abd: soft, nt  Neuro: MS: Awake, alert, gives the month is June but corrects her self to May, gives correct year and location CN: Pupils equal round and reactive, extraocular movements intact, face symmetric Motor: She moves all extremities well and equally Sensory: Intact to light touch  Pertinent Labs: B12 1100 TSH normal b1 pending   Impression: 70 year old female with at least three presentations for altered mental status.  With negative MRI and EEG, I think that stroke or seizure is less likely.  Hypertensive encephalopathy could be a consideration especially given that she is improving.  Another possibility would be that she took too much of her Robaxin, which she states she can take her self.   Recommendations: 1) I would continue supportive care at this time. 2) no need for permissive hypertension, can treat as hypertensive emergency per internal medicine 3) neurology will follow  Ritta Slot, MD [facility] [phone]  If 7pm- 7am, please page neurology on call as listed in [facility].

## 2021-03-21 DIAGNOSIS — I160 Hypertensive urgency: Secondary | ICD-10-CM

## 2021-03-21 DIAGNOSIS — R4182 Altered mental status, unspecified: Secondary | ICD-10-CM | POA: Diagnosis not present

## 2021-03-21 DIAGNOSIS — N1832 Chronic kidney disease, stage 3b: Secondary | ICD-10-CM | POA: Diagnosis not present

## 2021-03-21 DIAGNOSIS — I10 Essential (primary) hypertension: Secondary | ICD-10-CM | POA: Diagnosis not present

## 2021-03-21 LAB — CBC
HCT: 33.7 % — ABNORMAL LOW (ref 36.0–46.0)
Hemoglobin: 11 g/dL — ABNORMAL LOW (ref 12.0–15.0)
MCH: 30.4 pg (ref 26.0–34.0)
MCHC: 32.6 g/dL (ref 30.0–36.0)
MCV: 93.1 fL (ref 80.0–100.0)
Platelets: 222 10*3/uL (ref 150–400)
RBC: 3.62 MIL/uL — ABNORMAL LOW (ref 3.87–5.11)
RDW: 14.6 % (ref 11.5–15.5)
WBC: 9.3 10*3/uL (ref 4.0–10.5)
nRBC: 0 % (ref 0.0–0.2)

## 2021-03-21 LAB — BASIC METABOLIC PANEL
Anion gap: 10 (ref 5–15)
BUN: 40 mg/dL — ABNORMAL HIGH (ref 8–23)
CO2: 22 mmol/L (ref 22–32)
Calcium: 9 mg/dL (ref 8.9–10.3)
Chloride: 104 mmol/L (ref 98–111)
Creatinine, Ser: 1.77 mg/dL — ABNORMAL HIGH (ref 0.44–1.00)
GFR, Estimated: 31 mL/min — ABNORMAL LOW (ref >60)
Glucose, Bld: 111 mg/dL — ABNORMAL HIGH (ref 70–99)
Potassium: 4.6 mmol/L (ref 3.5–5.1)
Sodium: 136 mmol/L (ref 135–145)

## 2021-03-21 LAB — URINE CULTURE

## 2021-03-21 LAB — GLUCOSE, CAPILLARY: Glucose-Capillary: 120 mg/dL — ABNORMAL HIGH (ref 70–99)

## 2021-03-21 NOTE — TOC Transition Note (Signed)
Transition of Care [facility]) - CM/SW Discharge Note   Patient Details  Name: Michelle Graves MRN: [id number] Date of Birth: June 21, 1951  Transition of Care [facility]) CM/SW Contact:  Kermit Balo, RN Phone Number: 03/21/2021, 12:01 PM   Clinical Narrative:    Patient is discharging home with [facility] services though [place]. Cindy with [facility] aware of d/c home today. Pt has all needed DME at the home. Pt lives alone but states her son and daughter check in on her.  DME: walker/ 3 in 1/ wheelchair and brace Pt denies issues with transportation or home medications.  No new medications at d/c.  Pt has transport home.   Final next level of care: Home w Home Health Services Barriers to Discharge: No Barriers Identified   Patient Goals and CMS Choice   CMS Medicare.gov Compare Post Acute Care list provided to:: Patient Choice offered to / list presented to : Patient  Discharge Placement                       Discharge Plan and Services                          HH Arranged: PT,OT [facility] Agency: [facility] Care Date [facility] Agency Contacted: 03/21/21   Representative spoke with at [facility] Agency: Arline Asp  Social Determinants of Health (SDOH) Interventions     Readmission Risk Interventions Readmission Risk Prevention Plan 05/10/2020 05/10/2020 10/28/2019  Transportation Screening - Complete Complete  PCP or Specialist Appt within 3-5 Days - - -  Not Complete comments - - -  HRI or Home Care Consult - - -  HRI or Home Care Consult comments - - -  Social Work Consult for Recovery Care Planning/Counseling - - -  Palliative Care Screening - - -  Medication Review [facility]) - Referral to Pharmacy Referral to Pharmacy  PCP or Specialist appointment within 3-5 days of discharge Complete Complete Not Complete  PCP/Specialist Appt Not Complete comments - - due to holiday, pt to make appointment  HRI or Home Care Consult - Complete Complete  SW Recovery Care/Counseling Consult - -  Complete  Palliative Care Screening - Not Applicable Not Applicable  Skilled Nursing Facility - Patient Refused Complete  Some recent data might be hidden

## 2021-03-21 NOTE — Progress Notes (Signed)
Pt has been discharged for the unit via wheelchair. All belongings have been sent. All IV and tele has been disconnected. AVS has been given and reviewed. Pt sent in good spirits.

## 2021-03-21 NOTE — Discharge Instructions (Signed)
Dear [name],  You presented to hospital altered but you have improved significantly this morning.  -- Please continue taking Coreg 25 mg two times daily -- Please do not start Diltiazem until you see your PCP -- Also, please get yearly eye checkups as you are taking Hydrocholoquine  It was pleasure taking care of you! Dr. Gerarda Fraction

## 2021-03-21 NOTE — Hospital Course (Addendum)
Michelle Graves is a 70 y.o. female presented acute encephalopathic and treated for hypertension urgency . PMH is significant for stroke in 2020, encephalopathy, HTN, CKD, GAD, depression, multi nodular goiter, RA, Sjogren's, hx bowel obstruction, lumbar spondylosis.    Acute encephalopathy-Improved On initial presentation patient with decreased LOC, bilaterally arm and leg weakness, expressive aphasia, and dysarthria. Code stroke called. Last known well on 5/23 at 2200. Outside of tPA window. Elevated BP ranging from 157-188/100-137. CT head without acute intracranial hemorrhage or evidence of acute infarction. CTA head and neck without large vessel occlusion or hemodynamically significant stenosis. 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 MRI brain negative for acute stroke. CXR with chronic cardiomegaly and tortuous aorta. Unclear etiology at this point, differential includes seizure given her initial unresponsiveness and possible post ictal state. EEG results showing diffuse encephalopathy without seizures or epileptiform changes.  Also consider infectious etiology though afebrile and without white count, as well as normal CXR.  Hypoglycemia unlikely as glucose 100 on arrival. Also considered medications as a cause of AMS as patient not clear on her meds and how much she takes though daughter organizes and distributes her medications. Of note, patient with prior multiple recent hospitalizations for AMS due to urinary retention, AKI, colonic stricture. Most recently on 01/30/21 in the setting of UTI, hypoglycemia and polypharmacy.   Hypertensive urgency Patient had blood pressure of 188/113 in ED. As per daughter patient did not take her medication yesterday. Home medications: Carvedilol 25 mg twice daily, diltiazem 180 mg BID. Received Labetalol for high BP but later improved with Coreg 25 mg BID.  ? Left leg dependent edema around  knee I R foot drop As per daughter, it is chronic and has not tried anything in the past to relieve it.  Ordered ankle foot othosis and worked with PT & OT who suggested home health.   All other chronic conditions stable during admission.

## 2021-03-21 NOTE — Progress Notes (Signed)
Physical Therapy Treatment Patient Details Name: Michelle Graves MRN: [id number] DOB: 10-Mar-1951 Today's Date: 03/21/2021    History of Present Illness 70 y/o female presented to ED on 5/23 for AMS, EEG suggestive of diffuse encephalopathy. MRI brain negative for acute CVA; shows chronic micro hemorrhages clustered about the cerebellum, brainstem, and deep gray nuclei. CTA shows 45mm R periophthalmic aneurysm with 1 mm lobule, sub 14mm distal supraclinoid L ICA aneursym. Recently hospitalized 1/31-2/5, 2/12-2/15, 2/19-2/23, 3/28-4/6 for UTIs and encephalopathy, as well as subacute L cereballar CVA at 2/19 stay. PMH: cerebellar stroke, HTN, recurrent UTI, diverticulitis, anemia, CKD, RA,depression.    PT Comments    Pt reports feeling back to baseline, will be d/c home with intermittent supervision from her children. Pt overall supervision for mobility today, no physical assist needed for mobility tasks to simulate home environment. Pt is fall risk given weakness and unsteadiness, but PT does suspect pt Is close to baseline. PT to continue to follow while acute.    Follow Up Recommendations  Home health PT;Supervision for mobility/OOB     Equipment Recommendations  None recommended by PT    Recommendations for Other Services       Precautions / Restrictions Precautions Precautions: Fall Precaution Comments: history of urinary incontinence Restrictions Weight Bearing Restrictions: No    Mobility  Bed Mobility Overal bed mobility: Needs Assistance Bed Mobility: Supine to Sit     Supine to sit: Supervision     General bed mobility comments: for safety, increased time. HOB flat without bedrails to simulate home    Transfers Overall transfer level: Needs assistance Equipment used: Rolling walker (2 wheeled) Transfers: Sit to/from Stand Sit to Stand: Supervision         General transfer comment: for safety, no physical assist  Ambulation/Gait Ambulation/Gait assistance:  Supervision Gait Distance (Feet): 150 Feet Assistive device: Rolling walker (2 wheeled) Gait Pattern/deviations: Step-through pattern;Decreased stride length;Trunk flexed Gait velocity: decr   General Gait Details: supervision for safety, cuing for upright posture and placement in RW x2.   Stairs             Wheelchair Mobility    Modified Rankin (Stroke Patients Only)       Balance Overall balance assessment: Needs assistance Sitting-balance support: No upper extremity supported;Feet supported Sitting balance-[name]: Good     Standing balance support: Bilateral upper extremity supported Standing balance-[name]: Poor Standing balance comment: reliant on external support                            Cognition Arousal/Alertness: Awake/alert Behavior During Therapy: WFL for tasks assessed/performed Overall Cognitive Status: No family/caregiver present to determine baseline cognitive functioning Area of Impairment: Attention;Following commands;Safety/judgement;Memory                   Current Attention Level: Selective Memory: Decreased short-term memory Following Commands: Follows multi-step commands with increased time Safety/Judgement: Decreased awareness of deficits            Exercises      General Comments General comments (skin integrity, edema, etc.): HRmax observed 106 bpm      Pertinent Vitals/Pain Pain Assessment: Faces Faces Pain Scale: Hurts a little bit Pain Location: perineal area, after purewick removal Pain Descriptors / Indicators: Discomfort;Grimacing Pain Intervention(s): Monitored during session;Repositioned    Home Living  Prior Function            PT Goals (current goals can now be found in the care plan section) Acute Rehab PT Goals Patient Stated Goal: To return home. PT Goal Formulation: With patient Time For Goal Achievement: 04/03/21 Potential to Achieve Goals:  Good Progress towards PT goals: Progressing toward goals    Frequency    Min 3X/week      PT Plan Current plan remains appropriate    Co-evaluation              AM-PAC PT "6 Clicks" Mobility   Outcome Measure  Help needed turning from your back to your side while in a flat bed without using bedrails?: None Help needed moving from lying on your back to sitting on the side of a flat bed without using bedrails?: None Help needed moving to and from a bed to a chair (including a wheelchair)?: None Help needed standing up from a chair using your arms (e.g., wheelchair or bedside chair)?: None Help needed to walk in hospital room?: A Little Help needed climbing 3-5 steps with a railing? : A Little 6 Click Score: 22    End of Session   Activity Tolerance: Patient limited by fatigue Patient left: in chair;with call bell/phone within reach Nurse Communication: Mobility status;Other (comment) (tachycardia to 127 bpm during gait) PT Visit Diagnosis: Unsteadiness on feet (R26.81);Muscle weakness (generalized) (M62.81);Difficulty in walking, not elsewhere classified (R26.2)     Time: 8916-9450 PT Time Calculation (min) (ACUTE ONLY): 16 min  Charges:  $Gait Training: 8-22 mins                    Marye Round, PT DPT Acute Rehabilitation Services Pager [phone]  Office [phone]   Truddie Coco 03/21/2021, 2:47 PM

## 2021-03-21 NOTE — Discharge Summary (Addendum)
Family Medicine Teaching [facility] Discharge Summary  Patient name: Michelle Graves Medical record number: [id number] Date of birth: November 23, 1950 Age: 70 y.o. Gender: female Date of Admission: 03/19/2021  Date of Discharge: 03/21/2021 Admitting Physician: Billey Co, MD  Primary Care Provider: [facility] Consultants: Neurology  Indication for Hospitalization: Hypertensive urgency  Discharge Diagnoses/Problem List:  1.  Acute encephalopathy likely secondary to hypertension; improved 2.  Hypertensive urgency 3.  Left leg dependent edema around knee, right foot drop  Disposition: Home  Discharge Condition: Stable  Discharge Exam:  General: Pleasant lady sitting comfortably in bed, NAD Cardiovascular: RRR, no murmurs rubs or gallops Respiratory: Clear to auscultation bilaterally Abdomen: Soft, nontender, nondistended Extremities: Left leg has dependent edema around knee, right foot drop, picture in media Neuro: Alert, awake, oriented x 3  Brief Hospital Course:  Michelle Graves is a 70 y.o. female presented acute encephalopathic and treated for hypertension urgency . PMH is significant for stroke in 2020, encephalopathy, HTN, CKD, GAD, depression, multi nodular goiter, RA, Sjogren's, hx bowel obstruction, lumbar spondylosis.    Acute encephalopathy-Improved On initial presentation patient with decreased LOC, bilaterally arm and leg weakness, expressive aphasia, and dysarthria. Code stroke called. Last known well on 5/23 at 2200. Outside of tPA window. Elevated BP ranging from 157-188/100-137. CT head without acute intracranial hemorrhage or evidence of acute infarction. CTA head and neck without large vessel occlusion or hemodynamically significant stenosis. 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 MRI brain negative for acute stroke. CXR with chronic cardiomegaly and  tortuous aorta. Unclear etiology at this point, differential includes seizure given her initial unresponsiveness and possible post ictal state. EEG results showing diffuse encephalopathy without seizures or epileptiform changes.  Also consider infectious etiology though afebrile and without white count, as well as normal CXR.  Hypoglycemia unlikely as glucose 100 on arrival. Also considered medications as a cause of AMS as patient not clear on her meds and how much she takes though daughter organizes and distributes her medications. Of note, patient with prior multiple recent hospitalizations for AMS due to urinary retention, AKI, colonic stricture. Most recently on 01/30/21 in the setting of UTI, hypoglycemia and polypharmacy.   Hypertensive urgency Patient had blood pressure of 188/113 in ED. As per daughter patient did not take her medication yesterday. Home medications: Carvedilol 25 mg twice daily, diltiazem 180 mg BID. Received Labetalol for high BP but later improved with Coreg 25 mg BID.  ? Left leg dependent edema around knee I R foot drop As per daughter, it is chronic and has not tried anything in the past to relieve it.  Ordered ankle foot othosis and worked with PT & OT who suggested home health.   All other chronic conditions stable during admission.    Issues for Follow Up:  BP check within a week with PCP CBC in 1 week BMP for sCr in a week  Significant Procedures: None  Significant Labs and Imaging:  Recent Labs  Lab 03/19/21 1444 03/19/21 1449 03/20/21 0323 03/21/21 0543  WBC 10.2  --  8.7 9.3  HGB 12.0 12.9 11.4* 11.0*  HCT 37.6 38.0 35.9* 33.7*  PLT 208  --  256 222   Recent Labs  Lab 03/19/21 1444 03/19/21 1449 03/20/21 0323 03/21/21 0543  NA 140 139 140 136  K 4.7 4.5 4.6 4.6  CL 107 111 107 104  CO2 20*  --  23 22  GLUCOSE 100* 101* 97 111*  BUN 37* 38* 30* 40*  CREATININE 1.45* 1.40* 1.29* 1.77*  CALCIUM 9.3  --  9.3 9.0  ALKPHOS 98  --  99  --    AST 15  --  19  --   ALT 10  --  13  --   ALBUMIN 3.8  --  3.7  --      Results/Tests Pending at Time of Discharge:  MR BRAIN WO CONTRAST  Result Date: 03/20/2021 CLINICAL DATA:  Initial evaluation for neuro deficit, stroke suspected. EXAM: MRI HEAD WITHOUT CONTRAST TECHNIQUE: Multiplanar, multiecho pulse sequences of the brain and surrounding structures were obtained without intravenous contrast. COMPARISON:  Prior CT from 03/19/2021 FINDINGS: Brain: Examination mildly degraded by motion artifact. Cerebral volume within normal limits for age. Extensive patchy and confluent T2/FLAIR hyperintensity seen throughout the periventricular and deep white matter both cerebral hemispheres, most like related to advanced chronic microvascular ischemic disease. Mild patchy involvement of the pons. Multiple superimposed remote lacunar infarcts present about the right basal ganglia and thalami. Few small remote left cerebellar infarcts. No abnormal foci of restricted diffusion to suggest acute or subacute ischemia. Gray-white matter differentiation maintained. No other areas of remote cortical infarction. No acute intracranial hemorrhage. Multiple chronic micro hemorrhages noted clustered about the cerebellum, brainstem, and deep gray nuclei, likely related to chronic poorly controlled hypertension. No mass lesion, midline shift or mass effect. No hydrocephalus or extra-axial fluid collection. Pituitary gland suprasellar region normal. Vascular: Major intracranial vascular flow voids are maintained. Skull and upper cervical spine: Craniocervical junction within normal limits. Bone marrow signal intensity normal. No scalp soft tissue abnormality. Sinuses/Orbits: Globes and orbital soft tissues within normal limits. Mild scattered mucosal thickening noted about the ethmoidal air cells. Paranasal sinuses are otherwise clear. No significant mastoid effusion. Inner ear structures grossly normal. Other: None. IMPRESSION: 1.  No acute intracranial abnormality. 2. Advanced chronic microvascular ischemic disease with multiple remote lacunar infarcts involving the right basal ganglia, thalami, and left cerebellum. 3. Multiple chronic micro hemorrhages clustered about the cerebellum, brainstem, and deep gray nuclei, likely related to chronic poorly controlled hypertension. Electronically Signed   By: Rise Mu M.D.   On: 03/20/2021 03:08   DG Chest Port 1 View  Result Date: 03/19/2021 CLINICAL DATA:  Mental status changes.  Weakness. EXAM: PORTABLE CHEST 1 VIEW COMPARISON:  01/21/2021 FINDINGS: Chronic cardiomegaly. Aortic tortuosity. The lungs are clear. No infiltrate, collapse or effusion. No pulmonary edema. IMPRESSION: Chronic cardiomegaly.  Aortic tortuosity.  No active disease. Electronically Signed   By: Paulina Fusi M.D.   On: 03/19/2021 16:53   EEG adult  Result Date: 03/20/2021 Michelle Quest, MD     03/20/2021  8:46 AM Patient Name: Michelle Graves MRN: [id number] Epilepsy Attending: Charlsie Graves Referring Physician/Provider: Jimmye Norman, NP Date: 03/19/2021 Duration: 24.50 mins Patient history: 70 year old female with altered mental status and.  EEG to evaluate for seizures. Level of alertness: Awake AEDs during EEG study: None Technical aspects: This EEG study was done with scalp electrodes positioned according to the 10-20 International system of electrode placement. Electrical activity was acquired at a sampling rate of 500Hz  and reviewed with a high frequency filter of 70Hz  and a low frequency filter of 1Hz . EEG data were recorded continuously and digitally stored. Description: EEG showed continuous generalized 3 to 6 Hz theta-delta slowing. Hyperventilation and photic stimulation were not performed.   ABNORMALITY - Continuous slow, generalized IMPRESSION: This study is  suggestive of moderate diffuse encephalopathy, nonspecific etiology. No seizures or epileptiform discharges were seen  throughout the recording. Michelle Graves   CT HEAD CODE STROKE WO CONTRAST  Result Date: 03/19/2021 CLINICAL DATA:  Code stroke. EXAM: CT HEAD WITHOUT CONTRAST TECHNIQUE: Contiguous axial images were obtained from the base of the skull through the vertex without intravenous contrast. COMPARISON:  01/21/2021 FINDINGS: Brain: No acute intracranial hemorrhage, mass effect, or edema. No new loss of gray-white differentiation. Chronic infarct of the right basal ganglia and adjacent white matter. Fluent areas of hypoattenuation in the supratentorial white matter likely reflects stable chronic microvascular ischemic changes. Ventricles and sulci are stable in size and configuration. No extra-axial collection. Vascular: No hyperdense vessel. There is intracranial atherosclerotic calcification at the skull base. Skull: Unremarkable. Sinuses/Orbits: New mildly hyperdense intraconal tubular structure of the inferolateral left orbit (best seen on coronal series 6, image 19). Other: Mastoid air cells are clear. ASPECTS (Alberta Stroke Program Early CT Score) - Ganglionic level infarction (caudate, lentiform nuclei, internal capsule, insula, M1-M3 cortex): 7 - Supraganglionic infarction (M4-M6 cortex): 3 Total score (0-10 with 10 being normal): 10 IMPRESSION: There is no acute intracranial hemorrhage or evidence of acute infarction. ASPECT score is 10. New mildly hyperdense intraconal tubular structure of the inferolateral left orbit. 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 Stable chronic/nonemergent findings detailed above. These results were communicated to Dr. Amada Jupiter at 2:56 pm on 03/19/2021 by text page via the [facility] messaging system. Electronically Signed   By: Guadlupe Spanish M.D.   On: 03/19/2021 15:02   CT ANGIO HEAD NECK W WO CM W PERF (CODE STROKE)  Result Date: 03/19/2021 CLINICAL DATA:  Left-sided weakness EXAM: CT ANGIOGRAPHY HEAD AND NECK TECHNIQUE: Multidetector CT imaging of the head and neck was performed  using the standard protocol during bolus administration of intravenous contrast. Multiplanar CT image reconstructions and MIPs were obtained to evaluate the vascular anatomy. Carotid stenosis measurements (when applicable) are obtained utilizing NASCET criteria, using the distal internal carotid diameter as the denominator. CONTRAST:  ISOVUE-300 IOPAMIDOL (ISOVUE-300) INJECTION 61% COMPARISON:  None. FINDINGS: CTA NECK Aortic arch: Great vessel origins are patent. Right carotid system: Patent. Minimal calcified plaque at the ICA origin without stenosis. Retropharyngeal course of the ICA with the vessel extending as far medial as midline. Left carotid system: Patent. Minimal calcified plaque along the proximal internal carotid without stenosis. Vertebral arteries: Patent. Left vertebral artery is dominant. No stenosis. Skeleton: Degenerative changes of the cervical spine. Other neck: Heterogeneous thyroid, recent evaluated by ultrasound. Upper chest: No apical lung mass.  Scarring at the apices. Review of the MIP images confirms the above findings CTA HEAD Anterior circulation: Intracranial internal carotid arteries are patent with mild calcified plaque. Superiorly directed outpouching at the right ophthalmic artery origin measuring 2 x 1.6 mm (on sagittal series 10, image 92). There is an additional 1 mm posterosuperior directed outpouching at the aneurysm apex. A shallow, broad-based 1.1 x 1.5 mm (series 10, image 109) inferiorly directed outpouching is present at the distal supraclinoid left ICA. Anterior and middle cerebral arteries are patent. Posterior circulation: Intracranial vertebral arteries are patent. There is moderate stenosis of the distal right vertebral artery. Basilar artery is patent. Major cerebellar artery origins are patent. Posterior cerebral arteries are patent. Venous sinuses: Patent as allowed by contrast bolus timing. Review of the MIP images confirms the above findings IMPRESSION: No  large vessel occlusion or hemodynamically significant stenosis. 2 mm right periophthalmic aneurysm with a 1 mm daughter  lobule. 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 Electronically Signed   By: Guadlupe Spanish M.D.   On: 03/19/2021 16:12    Discharge Medications:  Allergies as of 03/21/2021       Reactions   Atorvastatin Nausea And Vomiting        Medication List     STOP taking these medications    diltiazem 180 MG 24 hr capsule Commonly known as: CARDIZEM CD       TAKE these medications    atorvastatin 40 MG tablet Commonly known as: LIPITOR Take 1.5 tablets (60 mg total) by mouth daily.   carvedilol 25 MG tablet Commonly known as: COREG Take 1 tablet (25 mg total) by mouth 2 (two) times daily.   clopidogrel 75 MG tablet Commonly known as: PLAVIX Take 1 tablet (75 mg total) by mouth daily.   DULoxetine 60 MG capsule Commonly known as: CYMBALTA Take 1 capsule (60 mg total) by mouth at bedtime.   hydroxychloroquine 200 MG tablet Commonly known as: PLAQUENIL Take 1 tablet (200 mg total) by mouth daily.   lactulose 10 GM/15ML solution Commonly known as: CHRONULAC Take 15 mLs (10 g total) by mouth daily as needed for mild constipation.   methocarbamol 500 MG tablet Commonly known as: ROBAXIN Take 1 tablet (500 mg total) by mouth every 8 (eight) hours as needed for muscle spasms.   multivitamin with minerals Tabs tablet Take 1 tablet by mouth daily.   pantoprazole 40 MG tablet Commonly known as: PROTONIX Take 1 tablet (40 mg total) by mouth daily.        Discharge Instructions: Please refer to Patient Instructions section of EMR for full details.  Patient was counseled important signs and symptoms that should prompt return to medical care, changes in medications, dietary instructions, activity restrictions, and follow up appointments.   Follow-Up Appointments:  Follow-up Information     [facility] Schedule an  appointment as soon as possible for a visit in 1 week(s).   Specialty: Family Medicine Contact information: [address] [place] [address] [phone]         [facility] Follow up.   Specialty: Home Health Services Why: The home health agency will contact you for the first home visit Contact information: [address] [place] [address] [phone]                 Arnoldo Lenis, MD 03/21/2021, 11:49 AM PGY-1, [facility]

## 2021-03-21 NOTE — Progress Notes (Signed)
Subjective: Greatly improved  Exam: Vitals:   03/21/21 0352 03/21/21 0723  BP: (!) 154/95 (!) 154/100  Pulse: 79 79  Resp: 17 20  Temp: 97.9 F (36.6 C) 98 F (36.7 C)  SpO2: 99% 100%   Gen: In bed, NAD Resp: non-labored breathing, no acute distress Abd: soft, nt  Neuro: MS: Awake, alert, oriented, able to give # of quarters in $2.75 and  CN: Pupils equal round and reactive, extraocular movements intact, face symmetric Motor: She moves all extremities well and equally Sensory: Intact to light touch  Pertinent Labs: B12 1100 TSH normal b1 pending(doubt this is etiology)   Impression: 70 year old female with at least three presentations for altered mental status.  With negative MRI and EEG, I think that stroke or seizure is less likely.  Hypertensive encephalopathy could be a consideration especially given that she is improving.  She denies taking robaxin the day of admission, so this is less likely.   At this point, though hypertensive encephalopathy is a significant possibility, I think the etiology of her spell is still in some doubt. However, with her improvement, I do not think that further testing is very low yield. If she continues to have further spells, then further testing would be indicated at that time.    Recommendations: 1) No further inpatient recommendations at this time. Please call with further questions or concerns.   Ritta Slot, MD [facility] [phone]  If 7pm- 7am, please page neurology on call as listed in AMION.

## 2021-03-26 LAB — VITAMIN B1: Vitamin B1 (Thiamine): 159.9 nmol/L (ref 66.5–200.0)

## 2021-03-27 ENCOUNTER — Ambulatory Visit
Admission: RE | Admit: 2021-03-27 | Discharge: 2021-03-27 | Disposition: A | Discharge status: Home / Self Care | Payer: Medicare Other | Source: Ambulatory Visit | Attending: Neurology | Admitting: Neurology

## 2021-03-27 ENCOUNTER — Other Ambulatory Visit: Payer: Self-pay

## 2021-03-27 DIAGNOSIS — I639 Cerebral infarction, unspecified: Secondary | ICD-10-CM

## 2021-03-27 MED ORDER — GADOBENATE DIMEGLUMINE 529 MG/ML IV SOLN
17.0000 mL | Freq: Once | INTRAVENOUS | Status: AC | PRN
  Administered 2021-03-27: 17 mL via INTRAVENOUS

## 2021-04-01 NOTE — Progress Notes (Signed)
Kindly inform the patient that MR angiogram study of the neck shows no major blockages on either side to worry about

## 2021-04-01 NOTE — Progress Notes (Signed)
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  There is no major blockages to worry about.  Overall no significant change compared with CT angiogram from May 2022

## 2021-04-02 ENCOUNTER — Telehealth: Payer: Self-pay | Admitting: Emergency Medicine

## 2021-04-02 NOTE — Telephone Encounter (Signed)
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  There is no major blockages to worry about.  Overall no significant change compared with CT angiogram from May 2022

## 2021-04-02 NOTE — Telephone Encounter (Signed)
Called and spoke to patient's daughter regarding Dr. Marlis Edelson review and findings of patient's MR Angiogram of brain and neck.  Patient denied further questions, verbalized understanding and expressed appreciation for the phone call.

## 2021-04-15 ENCOUNTER — Emergency Department (HOSPITAL_COMMUNITY)
Admission: EM | Admit: 2021-04-15 | Discharge: 2021-04-17 | Disposition: A | Discharge status: Home / Self Care | Payer: Medicare Other | Attending: Emergency Medicine | Admitting: Emergency Medicine

## 2021-04-15 ENCOUNTER — Encounter (HOSPITAL_COMMUNITY): Payer: Self-pay

## 2021-04-15 DIAGNOSIS — Z20822 Contact with and (suspected) exposure to covid-19: Secondary | ICD-10-CM | POA: Insufficient documentation

## 2021-04-15 DIAGNOSIS — N183 Chronic kidney disease, stage 3 unspecified: Secondary | ICD-10-CM | POA: Insufficient documentation

## 2021-04-15 DIAGNOSIS — R059 Cough, unspecified: Secondary | ICD-10-CM | POA: Insufficient documentation

## 2021-04-15 DIAGNOSIS — R531 Weakness: Secondary | ICD-10-CM

## 2021-04-15 DIAGNOSIS — Z79899 Other long term (current) drug therapy: Secondary | ICD-10-CM | POA: Insufficient documentation

## 2021-04-15 DIAGNOSIS — M79641 Pain in right hand: Secondary | ICD-10-CM | POA: Diagnosis not present

## 2021-04-15 DIAGNOSIS — I129 Hypertensive chronic kidney disease with stage 1 through stage 4 chronic kidney disease, or unspecified chronic kidney disease: Secondary | ICD-10-CM | POA: Diagnosis not present

## 2021-04-15 DIAGNOSIS — M79642 Pain in left hand: Secondary | ICD-10-CM | POA: Diagnosis not present

## 2021-04-15 LAB — CBG MONITORING, ED: Glucose-Capillary: 95 mg/dL (ref 70–99)

## 2021-04-15 NOTE — ED Triage Notes (Signed)
Per ems, pt from home. Pt c/o generalized weakness x1 day and dark, foul urine x3 days. Hx of frequent UTIs. Pt is A&Ox4 at this time but family reports intermittent confusion the past week consistent with pt's past hx of UTI. EMS BP 142/90, CBG 97, HR 60. SpO2 99% RA.

## 2021-04-15 NOTE — ED Provider Notes (Signed)
Emergency Medicine Provider Triage Evaluation Note  Michelle Graves , a 70 y.o. female  was evaluated in triage.  Pt complains of generalized weakness x 4 days.  Reports urinary frequency and urgency.  States she feels too weak to walk.  Denies treatments PTA.  Review of Systems  Positive: Urinary frequency and urgency Negative: Fever, cough, cp, sob  Physical Exam  BP (!) 126/96 (BP Location: Left Arm)   Pulse 60   Temp 98 F (36.7 C)   Resp 18   Ht 5\' 6"  (1.676 m)   Wt 83.9 kg   SpO2 100%   BMI 29.86 kg/m  Gen:   Awake, no distress   Resp:  Normal effort  MSK:   Moves extremities without difficulty  Other:    Medical Decision Making  Medically screening exam initiated at 11:22 PM.  Appropriate orders placed.  Michelle Graves was informed that the remainder of the evaluation will be completed by another provider, this initial triage assessment does not replace that evaluation, and the importance of remaining in the ED until their evaluation is complete.     [name], PA-C 04/15/21 2324    04/17/21, MD 04/16/21 0201

## 2021-04-16 ENCOUNTER — Emergency Department (HOSPITAL_COMMUNITY): Payer: Medicare Other

## 2021-04-16 LAB — COMPREHENSIVE METABOLIC PANEL
ALT: 11 U/L (ref 0–44)
AST: 19 U/L (ref 15–41)
Albumin: 4 g/dL (ref 3.5–5.0)
Alkaline Phosphatase: 106 U/L (ref 38–126)
Anion gap: 17 — ABNORMAL HIGH (ref 5–15)
BUN: 42 mg/dL — ABNORMAL HIGH (ref 8–23)
CO2: 20 mmol/L — ABNORMAL LOW (ref 22–32)
Calcium: 9.8 mg/dL (ref 8.9–10.3)
Chloride: 102 mmol/L (ref 98–111)
Creatinine, Ser: 1.96 mg/dL — ABNORMAL HIGH (ref 0.44–1.00)
GFR, Estimated: 27 mL/min — ABNORMAL LOW (ref >60)
Glucose, Bld: 95 mg/dL (ref 70–99)
Potassium: 3.9 mmol/L (ref 3.5–5.1)
Sodium: 139 mmol/L (ref 135–145)
Total Bilirubin: 0.9 mg/dL (ref 0.3–1.2)
Total Protein: 7.9 g/dL (ref 6.5–8.1)

## 2021-04-16 LAB — URINALYSIS, ROUTINE W REFLEX MICROSCOPIC
Bilirubin Urine: NEGATIVE
Glucose, UA: NEGATIVE mg/dL
Hgb urine dipstick: NEGATIVE
Ketones, ur: NEGATIVE mg/dL
Leukocytes,Ua: NEGATIVE
Nitrite: NEGATIVE
Protein, ur: NEGATIVE mg/dL
Specific Gravity, Urine: 1.015 (ref 1.005–1.030)
pH: 5 (ref 5.0–8.0)

## 2021-04-16 LAB — TROPONIN I (HIGH SENSITIVITY)
Troponin I (High Sensitivity): 10 ng/L (ref <18)
Troponin I (High Sensitivity): 10 ng/L (ref <18)

## 2021-04-16 LAB — CBC
HCT: 38.8 % (ref 36.0–46.0)
Hemoglobin: 12 g/dL (ref 12.0–15.0)
MCH: 29.6 pg (ref 26.0–34.0)
MCHC: 30.9 g/dL (ref 30.0–36.0)
MCV: 95.6 fL (ref 80.0–100.0)
Platelets: 229 10*3/uL (ref 150–400)
RBC: 4.06 MIL/uL (ref 3.87–5.11)
RDW: 14 % (ref 11.5–15.5)
WBC: 5.8 10*3/uL (ref 4.0–10.5)
nRBC: 0 % (ref 0.0–0.2)

## 2021-04-16 LAB — RESP PANEL BY RT-PCR (FLU A&B, COVID) ARPGX2
Influenza A by PCR: NEGATIVE
Influenza B by PCR: NEGATIVE
SARS Coronavirus 2 by RT PCR: NEGATIVE

## 2021-04-16 MED ORDER — SODIUM CHLORIDE 0.9 % IV BOLUS
500.0000 mL | Freq: Once | INTRAVENOUS | Status: AC
  Administered 2021-04-16: 500 mL via INTRAVENOUS

## 2021-04-16 NOTE — ED Notes (Signed)
Notified PA unable to obtain IV on pt

## 2021-04-16 NOTE — ED Provider Notes (Signed)
[facility] EMERGENCY DEPARTMENT Provider Note   CSN: [id number] Arrival date & time: 04/15/21  2006     History Chief Complaint  Patient presents with   Weakness    Michelle Graves is a 70 y.o. female.  70 y.o female with a past medical history of stroke, acute encephalopathy, sepsis due to UTI, AKI presents to the ED with a chief complaint of urinary symptoms for the past 1 to 2 weeks.  Patient does report she has been urinating "a lot ", reports she took some over-the-counter align which she was told by PCP it would improve her symptoms, there has been no improvement or resolution in symptoms.  In addition, she is also reporting bilateral hand pain, she is unsure whether this is due to her arthritis.  She also endorses a cough, states this is nonproductive and recurrent.  Denies any fever, focal weakness, headache, chest pain or other complaints.   The history is provided by the patient.  Weakness Associated symptoms: no abdominal pain, no chest pain, no dysuria, no fever, no nausea, no shortness of breath, no urgency and no vomiting       Past Medical History:  Diagnosis Date   Acute on chronic kidney failure (HCC)    Anemia    Arthritis    Chest pain    Diverticulitis    Hypertension    Stroke [facility])    UTI (urinary tract infection) 10/2019    Patient Active Problem List   Diagnosis Date Noted   Acute alteration in mental status    Uncontrolled hypertension    Altered mental status 03/19/2021   Multinodular goiter 02/25/2021   Acute metabolic encephalopathy 01/21/2021   Hypertensive urgency 01/21/2021   Sepsis secondary to UTI (HCC) 01/21/2021   Adrenal insufficiency (HCC) 12/18/2020   Encephalopathy 12/17/2020   TIA (transient ischemic attack) 12/15/2020   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   Bowel obstruction (HCC) 05/06/2020   SIRS (systemic inflammatory response syndrome) (HCC) 05/06/2020   UTI (urinary tract infection) 11/08/2019    Major depressive disorder, recurrent episode, moderate (HCC) 11/08/2019   Urinary retention 10/28/2019   Acute on chronic kidney failure (HCC) 10/27/2019   Malnutrition of moderate degree 10/03/2019   Colitis 09/30/2019   AKI (acute kidney injury) (HCC) 05/28/2019   Nausea & vomiting 05/28/2019   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   Anemia, chronic disease 05/28/2019   Swelling    Small vessel disease, cerebrovascular    Acute ischemic stroke (HCC)    Weakness    Acute left ankle pain    Acute on chronic renal failure (HCC) 05/13/2019   Weakness of both lower extremities 05/13/2019   Cerebral thrombosis with cerebral infarction 03/31/2019   New onset a-fib (HCC) 03/27/2019   Acute kidney injury superimposed on CKD (HCC) 03/24/2019   Hypotension 03/24/2019   Hematemesis 03/24/2019   Dark stools 03/24/2019   Diverticulitis of intestine without perforation or abscess without bleeding    Sinus tachycardia 08/27/2016   Diverticulitis 08/26/2016   HYPERTENSION, MALIGNANT ESSENTIAL 06/24/2007   CKD (chronic kidney disease), stage III (HCC) 06/24/2007   Rheumatoid arthritis (HCC) 06/24/2007    Past Surgical History:  Procedure Laterality Date   BIOPSY  04/01/2019   Procedure: BIOPSY;  Surgeon: Kathi Der, MD;  Location: [facility] ENDOSCOPY;  Service: Gastroenterology;;   BIOPSY  10/05/2019   Procedure: BIOPSY;  Surgeon: Kathi Der, MD;  Location: [facility] ENDOSCOPY;  Service: Gastroenterology;;   BIOPSY  05/10/2020   Procedure: BIOPSY;  Surgeon: Kathi Der, MD;  Location: [facility] ENDOSCOPY;  Service: Gastroenterology;;   BIOPSY  11/29/2020   Procedure: BIOPSY;  Surgeon: Charlott Rakes, MD;  Location: [facility] ENDOSCOPY;  Service: Gastroenterology;;   COLONOSCOPY N/A 11/29/2020   Procedure: COLONOSCOPY;  Surgeon: Charlott Rakes, MD;  Location: [facility] ENDOSCOPY;  Service: Gastroenterology;  Laterality: N/A;   COLONOSCOPY WITH PROPOFOL N/A 04/01/2019   Procedure: COLONOSCOPY WITH PROPOFOL;  Surgeon:  Kathi Der, MD;  Location: [facility] ENDOSCOPY;  Service: Gastroenterology;  Laterality: N/A;   COLONOSCOPY WITH PROPOFOL N/A 10/05/2019   Procedure: COLONOSCOPY WITH PROPOFOL;  Surgeon: Kathi Der, MD;  Location: [facility] ENDOSCOPY;  Service: Gastroenterology;  Laterality: N/A;   COLONOSCOPY WITH PROPOFOL N/A 05/10/2020   Procedure: COLONOSCOPY WITH PROPOFOL;  Surgeon: Kathi Der, MD;  Location: [facility] ENDOSCOPY;  Service: Gastroenterology;  Laterality: N/A;   ESOPHAGOGASTRODUODENOSCOPY (EGD) WITH PROPOFOL N/A 04/01/2019   Procedure: ESOPHAGOGASTRODUODENOSCOPY (EGD) WITH PROPOFOL;  Surgeon: Kathi Der, MD;  Location: [facility] ENDOSCOPY;  Service: Gastroenterology;  Laterality: N/A;   ESOPHAGOGASTRODUODENOSCOPY (EGD) WITH PROPOFOL N/A 10/05/2019   Procedure: ESOPHAGOGASTRODUODENOSCOPY (EGD) WITH PROPOFOL;  Surgeon: Kathi Der, MD;  Location: [facility] ENDOSCOPY;  Service: Gastroenterology;  Laterality: N/A;   POLYPECTOMY  11/29/2020   Procedure: POLYPECTOMY;  Surgeon: Charlott Rakes, MD;  Location: [facility] ENDOSCOPY;  Service: Gastroenterology;;   SUBMUCOSAL TATTOO INJECTION  11/29/2020   Procedure: SUBMUCOSAL TATTOO INJECTION;  Surgeon: Charlott Rakes, MD;  Location: [facility] ENDOSCOPY;  Service: Gastroenterology;;   TONSILLECTOMY       OB History   No obstetric history on file.     Family History  Problem Relation Age of Onset   Hypertension Mother    Diabetes Mother    CAD Father        died of MI at age 38   Hypertension Father    Diabetes Sister    Diabetes Sister    Kidney disease Neg Hx    Adrenal disorder Neg Hx     Social History   Tobacco Use   Smoking status: Never   Smokeless tobacco: Never  Vaping Use   Vaping Use: Never used  Substance Use Topics   Alcohol use: Not Currently    Alcohol/week: 0.0 standard drinks    Comment: sometimes    Drug use: No    Home Medications Prior to Admission medications   Medication Sig Start Date End Date Taking? Authorizing  Provider  atorvastatin (LIPITOR) 40 MG tablet Take 1.5 tablets (60 mg total) by mouth daily. 03/12/21 06/10/21  Micki Riley, MD  carvedilol (COREG) 25 MG tablet Take 1 tablet (25 mg total) by mouth 2 (two) times daily. 01/03/21   Fargo, Amy E, NP  clopidogrel (PLAVIX) 75 MG tablet Take 1 tablet (75 mg total) by mouth daily. 03/12/21   Micki Riley, MD  DULoxetine (CYMBALTA) 60 MG capsule Take 1 capsule (60 mg total) by mouth at bedtime. 01/03/21   Fargo, Amy E, NP  hydroxychloroquine (PLAQUENIL) 200 MG tablet Take 1 tablet (200 mg total) by mouth daily. 01/03/21   Fargo, Amy E, NP  lactulose (CHRONULAC) 10 GM/15ML solution Take 15 mLs (10 g total) by mouth daily as needed for mild constipation. 01/29/21   Zannie Cove, MD  methocarbamol (ROBAXIN) 500 MG tablet Take 1 tablet (500 mg total) by mouth every 8 (eight) hours as needed for muscle spasms. 01/29/21   Zannie Cove, MD  Multiple Vitamin (MULTIVITAMIN WITH MINERALS) TABS tablet Take 1 tablet by mouth daily. 06/03/19   Kendell Bane, MD  pantoprazole (PROTONIX) 40 MG tablet Take 1 tablet (40 mg total) by mouth daily. 01/03/21   Octavia Heir, NP    Allergies    Atorvastatin  Review of Systems   Review of Systems  Constitutional:  Negative for chills and fever.  HENT:  Negative for sore throat.   Respiratory:  Negative for shortness of breath.   Cardiovascular:  Negative for chest pain.  Gastrointestinal:  Negative for abdominal pain, nausea and vomiting.  Genitourinary:  Positive for difficulty urinating. Negative for decreased urine volume, dysuria, flank pain and urgency.  Musculoskeletal:  Negative for back pain.  Neurological:  Positive for weakness.  All other systems reviewed and are negative.  Physical Exam Updated Vital Signs BP (!) 178/93   Pulse 64   Temp 98.1 F (36.7 C) (Oral)   Resp 18   Ht 5\' 6"  (1.676 m)   Wt 83.9 kg   SpO2 100%   BMI 29.86 kg/m   Physical Exam Vitals and nursing note reviewed.   Constitutional:      Appearance: Normal appearance. She is not ill-appearing or toxic-appearing.  HENT:     Head: Normocephalic and atraumatic.     Mouth/Throat:     Mouth: Mucous membranes are moist.  Eyes:     Pupils: Pupils are equal, round, and reactive to light.  Cardiovascular:     Rate and Rhythm: Normal rate.  Pulmonary:     Effort: Pulmonary effort is normal.     Breath sounds: No wheezing or rales.  Abdominal:     General: Abdomen is flat.     Tenderness: There is no abdominal tenderness. There is no right CVA tenderness or left CVA tenderness.  Skin:    General: Skin is warm and dry.  Neurological:     Mental Status: She is alert and oriented to person, place, and time.     GCS: GCS eye subscore is 4. GCS verbal subscore is 5. GCS motor subscore is 6.     Cranial Nerves: No cranial nerve deficit, dysarthria or facial asymmetry.    ED Results / Procedures / Treatments   Labs (all labs ordered are listed, but only abnormal results are displayed) Labs Reviewed  URINALYSIS, ROUTINE W REFLEX MICROSCOPIC - Abnormal; Notable for the following components:      Result Value   APPearance HAZY (*)    All other components within normal limits  COMPREHENSIVE METABOLIC PANEL - Abnormal; Notable for the following components:   CO2 20 (*)    BUN 42 (*)    Creatinine, Ser 1.96 (*)    GFR, Estimated 27 (*)    Anion gap 17 (*)    All other components within normal limits  RESP PANEL BY RT-PCR (FLU A&B, COVID) ARPGX2  URINE CULTURE  CBC  CBG MONITORING, ED  TROPONIN I (HIGH SENSITIVITY)  TROPONIN I (HIGH SENSITIVITY)    EKG None  Radiology DG Chest 2 View  Result Date: 04/16/2021 CLINICAL DATA:  Cough and weakness EXAM: CHEST - 2 VIEW COMPARISON:  03/19/2021 FINDINGS: Decreased lung volumes with mild bibasilar atelectasis. Negative for heart failure. No pleural effusion. Heart size mildly facet heart size upper normal. IMPRESSION: Hypoventilation with mild bibasilar  atelectasis. Electronically Signed   By: 03/21/2021 M.D.   On: 04/16/2021 13:14    Procedures Procedures   Medications Ordered in ED Medications  sodium chloride 0.9 % bolus 500 mL (500 mLs Intravenous Not Given 04/16/21 1413)    ED Course  I  have reviewed the triage vital signs and the nursing notes.  Pertinent labs & imaging results that were available during my care of the patient were reviewed by me and considered in my medical decision making (see chart for details).  Clinical Course as of 04/16/21 1504  Tue Apr 16, 2021  1207 BUN(!): 42 [JS]    Clinical Course User Index [JS] Claude Manges, PA-C   MDM Rules/Calculators/A&P   Patient with a past medical history of CVA, hypertension, acute metabolic encephalopathy presents to the ED for generalized weakness for the past 4 days.  Endorses she is felt to "sluggish to do anything ".  Has had some difficulty urinating, with urinary urgency and frequency.  Does have a prior history of recurrent UTIs.  Attempted to take over-the-counter medication without much improvement in symptoms.  She currently lives alone.  Patient evaluated by me approximately 10 hours after arrival in the ED.  He is overall nontoxic-appearing, vitals slight hypertension, afebrile, oxygen saturations 100% on room air.  Patient answers questions appropriately, she is ANO x4.  Lungs are clear to auscultation, abdomen is soft nontender to palpation.  No bilateral pitting edema, no calf tenderness.  12:58 PM Spoke to daughter Delorise Jackson, reports her mother's memory status has been worsening in the last couple of days.  She also reports mother states statements such as "I have to go to work ", patient has not been employed in several years.  Daughter also reports a 10 seconds later she repeats the same thought.  Daughter also reports that when she has been acting as "like she has a UTI ".  Her mental status has been on and off all year, she has been admitted previously for  acute metabolic encephalopathy.  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  He did have home health care in the past with [facility], however this is no longer happening  Interpretation of his labs showed CBC without leukocytosis, hemoglobin is within normal limits.  CMP with no electrolyte derangement.  Slight decrease in her CO2.  BUN is elevated, however slightly improved 42.  Creatinine was 1.9, slightly increased from her previous, will provided with fluids to help with hydration.  UA without any signs of nitrates, leukocytes, this was sent for culture.  She is negative for COVID, influenza A&B.  2:34 PM multiple attempts for IV hydration, however patient is a really tough stick.  She will need to be seen by PT.  According to notes, supposed to be evaluated by Dahlia Client.   Spoke to [place], physical therapist at the bedside, who was evaluating patient.  She reports patient does have a slow gait, is really unable to perform ADLs and would meet criteria for SNF.  However due to ongoing circumstances at home patient is likely not going to agree going to SNF.  2:48 PM Spoke to [place], daughter who is aware her mother does not qualify for home health at this time due to [place] healthcare.  Social work continues to attempt to find placement.  2:57 PM Spoke to daughter once again, who states patient is agreeable of SNF placement at this time.  Patient does agree.  We will reach back out to social work in order to change status on placement.   3:04 PM spoke to social work Radiation protection practitioner, who is currently working on placement at  this time.  Patient care signed out to oncoming provider pending placement.   Portions of this note were generated with Scientist, clinical (histocompatibility and immunogenetics). Dictation errors may occur despite best attempts at proofreading.  Final Clinical  Impression(s) / ED Diagnoses Final diagnoses:  Weakness    Rx / DC Orders ED Discharge Orders     None        Claude Manges, Cordelia Poche 04/16/21 1508    Wynetta Fines, MD 04/17/21 1442

## 2021-04-16 NOTE — Progress Notes (Signed)
CSW met with Pt and daughter Herbert Spires at bedside. Pt and dtr agrees that SNF placement is most appropriate course of action.      CSW completed FL2 PASRR pending. CSW uploaded requested documentation to [place].  CSW sent referrals to area SNFs.   CSW began insurance auth via [employer] with facility "pending" as no facility choice has been made as yet.

## 2021-04-16 NOTE — ED Notes (Signed)
This RN attempted Iv access twice without success

## 2021-04-16 NOTE — ED Notes (Signed)
Spoke to [place] in PT who states pt is assigned to be seen by PT.

## 2021-04-16 NOTE — Evaluation (Signed)
Physical Therapy Evaluation Patient Details Name: Michelle Graves MRN: [id number] DOB: 1951-02-24 Today's Date: 04/16/2021   History of Present Illness  Pt is a 70 y/o female presenting to the ED on 6/20 secondary to urinary issues and weakness. Pt also reporting back pain. PMH includes CVA, HTN, CKD, and RA.  Clinical Impression  Pt admitted secondary to problem above with deficits below. Pt reporting increased back pain which limited mobility tolerance. Required mod A for bed mobility and min A to stand and take side steps at EOB. Discussed SNF level therapies as pt lives alone and is having increased difficulty caring for herself. Pt reports she prefers to go home. Will need max HH services if pt returns home. Will continue to follow acutely.     Follow Up Recommendations SNF;Supervision/Assistance - 24 hour (Pt will likely refuse; will need max HH services)    Equipment Recommendations  None recommended by PT    Recommendations for Other Services       Precautions / Restrictions Precautions Precautions: Fall Restrictions Weight Bearing Restrictions: No      Mobility  Bed Mobility Overal bed mobility: Needs Assistance Bed Mobility: Supine to Sit;Sit to Supine     Supine to sit: Mod assist Sit to supine: Mod assist   General bed mobility comments: Mod A for trunk and LE assist to perform bed mobility tasks. Also required assist to scoot hips to EOB.    Transfers Overall transfer level: Needs assistance Equipment used: None Transfers: Sit to/from Stand Sit to Stand: Min assist         General transfer comment: Heavy min A for lift assist and steadying to stand. Reporting increased back pain  Ambulation/Gait Ambulation/Gait assistance: Min assist   Assistive device: None       General Gait Details: Performed side steps at EOB with min A. Pt held to PT arms for support. Increased back pain limited further mobility  Stairs            Wheelchair  Mobility    Modified Rankin (Stroke Patients Only)       Balance Overall balance assessment: Needs assistance Sitting-balance support: Feet supported;No upper extremity supported Sitting balance-[name]: Fair     Standing balance support: Bilateral upper extremity supported;During functional activity Standing balance-[name]: Poor Standing balance comment: Reliant on UE and external support                             Pertinent Vitals/Pain Pain Assessment: 0-10 Pain Score: 7  Pain Location: back Pain Descriptors / Indicators: Guarding;Grimacing Pain Intervention(s): Limited activity within patient's tolerance;Monitored during session;Repositioned    Home Living Family/patient expects to be discharged to:: Private residence Living Arrangements: Alone Available Help at Discharge: Family;Available PRN/intermittently Type of Home: Apartment Home Access: Elevator     Home Layout: One level Home Equipment: Walker - 2 wheels;Walker - 4 wheels;Cane - quad;Shower seat;Bedside commode;Wheelchair - manual;Grab bars - toilet;Grab bars - tub/shower      Prior Function Level of Independence: Independent with assistive device(s);Needs assistance   Gait / Transfers Assistance Needed: Used rollator for ambulation  ADL's / Homemaking Assistance Needed: Pt reports family drives her and helps prepare meals. Pt independent with ADLs        Hand Dominance        Extremity/Trunk Assessment   Upper Extremity Assessment Upper Extremity Assessment: Generalized weakness    Lower Extremity Assessment Lower Extremity Assessment: Generalized weakness  Cervical / Trunk Assessment Cervical / Trunk Assessment: Other exceptions Cervical / Trunk Exceptions: reporting increased lower back pain  Communication   Communication: No difficulties  Cognition Arousal/Alertness: Awake/alert Behavior During Therapy: WFL for tasks assessed/performed Overall Cognitive Status:  No family/caregiver present to determine baseline cognitive functioning                                        General Comments      Exercises     Assessment/Plan    PT Assessment Patient needs continued PT services  PT Problem List Decreased strength;Decreased balance;Decreased activity tolerance;Decreased mobility;Decreased knowledge of use of DME;Decreased knowledge of precautions;Pain       PT Treatment Interventions DME instruction;Gait training;Stair training;Functional mobility training;Therapeutic activities;Therapeutic exercise;Balance training;Patient/family education    PT Goals (Current goals can be found in the Care Plan section)  Acute Rehab PT Goals Patient Stated Goal: to go home PT Goal Formulation: With patient Time For Goal Achievement: 04/30/21 Potential to Achieve Goals: Fair    Frequency Min 2X/week   Barriers to discharge Decreased caregiver support      Co-evaluation               AM-PAC PT "6 Clicks" Mobility  Outcome Measure Help needed turning from your back to your side while in a flat bed without using bedrails?: A Little Help needed moving from lying on your back to sitting on the side of a flat bed without using bedrails?: A Lot Help needed moving to and from a bed to a chair (including a wheelchair)?: A Little Help needed standing up from a chair using your arms (e.g., wheelchair or bedside chair)?: A Little Help needed to walk in hospital room?: A Lot Help needed climbing 3-5 steps with a railing? : A Lot 6 Click Score: 15    End of Session Equipment Utilized During Treatment: Gait belt Activity Tolerance: Patient limited by pain Patient left: in bed;with call bell/phone within reach (on stretcher in the ED) Nurse Communication: Mobility status PT Visit Diagnosis: Unsteadiness on feet (R26.81);Muscle weakness (generalized) (M62.81)    Time: 9147-8295 PT Time Calculation (min) (ACUTE ONLY): 21 min   Charges:    PT Evaluation $PT Eval Moderate Complexity: 1 Mod          Farley Ly, PT, DPT  Acute Rehabilitation Services  Pager: [phone] Office: [phone]   Lehman Prom 04/16/2021, 3:07 PM

## 2021-04-16 NOTE — [place] FL2 (Signed)
[place] MEDICAID FL2 LEVEL OF CARE SCREENING TOOL     IDENTIFICATION  Patient Name: Michelle Graves Birthdate: 23-Aug-1951 Sex: female Admission Date (Current Location): 04/15/2021  [facility] and [place] Number:  Producer, television/film/video and Address:  [facility] [place], [address] [place], [place] [address]      Provider Number: [id number]  Attending Physician Name and Address:  Default, Provider, MD  Relative Name and Phone Number:  [name] Daughter [phone]    Current Level of Care: Hospital Recommended Level of Care: Skilled Nursing Facility Prior Approval Number:    Date Approved/Denied:   PASRR Number: Pending  Discharge Plan: SNF    Current Diagnoses: Patient Active Problem List   Diagnosis Date Noted   Acute alteration in mental status    Uncontrolled hypertension    Altered mental status 03/19/2021   Multinodular goiter 02/25/2021   Acute metabolic encephalopathy 01/21/2021   Hypertensive urgency 01/21/2021   Sepsis secondary to UTI (HCC) 01/21/2021   Adrenal insufficiency (HCC) 12/18/2020   Encephalopathy 12/17/2020   TIA (transient ischemic attack) 12/15/2020   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   Bowel obstruction (HCC) 05/06/2020   SIRS (systemic inflammatory response syndrome) (HCC) 05/06/2020   UTI (urinary tract infection) 11/08/2019   Major depressive disorder, recurrent episode, moderate (HCC) 11/08/2019   Urinary retention 10/28/2019   Acute on chronic kidney failure (HCC) 10/27/2019   Malnutrition of moderate degree 10/03/2019   Colitis 09/30/2019   AKI (acute kidney injury) (HCC) 05/28/2019   Nausea & vomiting 05/28/2019   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   Anemia, chronic disease 05/28/2019   Swelling    Small vessel disease, cerebrovascular    Acute ischemic stroke (HCC)    Weakness    Acute left ankle pain    Acute on chronic renal failure (HCC) 05/13/2019   Weakness of both lower extremities 05/13/2019    Cerebral thrombosis with cerebral infarction 03/31/2019   New onset a-fib (HCC) 03/27/2019   Acute kidney injury superimposed on CKD (HCC) 03/24/2019   Hypotension 03/24/2019   Hematemesis 03/24/2019   Dark stools 03/24/2019   Diverticulitis of intestine without perforation or abscess without bleeding    Sinus tachycardia 08/27/2016   Diverticulitis 08/26/2016   HYPERTENSION, MALIGNANT ESSENTIAL 06/24/2007   CKD (chronic kidney disease), stage III (HCC) 06/24/2007   Rheumatoid arthritis (HCC) 06/24/2007    Orientation RESPIRATION BLADDER Height & Weight     Self, Time, Situation, Place  Normal Incontinent Weight: 185 lb (83.9 kg) Height:  5\' 6"  (167.6 cm)  BEHAVIORAL SYMPTOMS/MOOD NEUROLOGICAL BOWEL NUTRITION STATUS      Continent Diet (normal)  AMBULATORY STATUS COMMUNICATION OF NEEDS Skin   Extensive Assist Verbally Bruising (bruise under right eye due to previous fall)                       Personal Care Assistance Level of Assistance  Bathing, Feeding, Dressing Bathing Assistance: Independent Feeding assistance: Independent Dressing Assistance: Independent     Functional Limitations Info  Sight, Hearing, Speech Sight Info: Adequate Hearing Info: Adequate Speech Info: Adequate    SPECIAL CARE FACTORS FREQUENCY  PT (By licensed PT), OT (By licensed OT)     PT Frequency: 5x weekly OT Frequency: 5x weekly            Contractures Contractures Info: Not present    Additional Factors Info  Code Status, Allergies Code Status Info: Full Allergies Info: Atorvastatin  Current Medications (04/16/2021):  This is the current hospital active medication list No current facility-administered medications for this encounter.   Current Outpatient Medications  Medication Sig Dispense Refill   carvedilol (COREG) 25 MG tablet Take 1 tablet (25 mg total) by mouth 2 (two) times daily. 60 tablet 0   clopidogrel (PLAVIX) 75 MG tablet Take 1 tablet (75 mg total)  by mouth daily. 30 tablet 11   diltiazem (CARDIZEM CD) 180 MG 24 hr capsule Take 180 mg by mouth daily.     doxepin (SINEQUAN) 75 MG capsule Take 75 mg by mouth at bedtime.     DULoxetine (CYMBALTA) 60 MG capsule Take 1 capsule (60 mg total) by mouth at bedtime. 30 capsule 0   HYDROcodone-acetaminophen (NORCO/VICODIN) 5-325 MG tablet Take 1 tablet by mouth 2 (two) times daily as needed for severe pain.     hydroxychloroquine (PLAQUENIL) 200 MG tablet Take 1 tablet (200 mg total) by mouth daily. 30 tablet 0   lactulose (CHRONULAC) 10 GM/15ML solution Take 15 mLs (10 g total) by mouth daily as needed for mild constipation. 1892 mL 0   Multiple Vitamin (MULTIVITAMIN WITH MINERALS) TABS tablet Take 1 tablet by mouth daily. 30 tablet 2   pantoprazole (PROTONIX) 40 MG tablet Take 1 tablet (40 mg total) by mouth daily. 30 tablet 0   atorvastatin (LIPITOR) 40 MG tablet Take 1.5 tablets (60 mg total) by mouth daily. (Patient not taking: No sig reported) 30 tablet 3   methocarbamol (ROBAXIN) 500 MG tablet Take 1 tablet (500 mg total) by mouth every 8 (eight) hours as needed for muscle spasms. (Patient not taking: No sig reported)       Discharge Medications: Please see discharge summary for a list of discharge medications.  Relevant Imaging Results:  Relevant Lab Results:   Additional Information SS#: [id number]/ Per chart Pt has had Covid vaccinations  Eliakim Tendler, LCSW

## 2021-04-17 LAB — URINE CULTURE: Culture: >=100000 — AB

## 2021-04-17 MED ORDER — DOXEPIN HCL 25 MG PO CAPS
75.0000 mg | ORAL_CAPSULE | Freq: Every day | ORAL | Status: DC
  Filled 2021-04-17: qty 1

## 2021-04-17 MED ORDER — CLOPIDOGREL BISULFATE 75 MG PO TABS
75.0000 mg | ORAL_TABLET | Freq: Every day | ORAL | Status: DC
  Administered 2021-04-17: 75 mg via ORAL
  Filled 2021-04-17: qty 1

## 2021-04-17 MED ORDER — DILTIAZEM HCL ER COATED BEADS 180 MG PO CP24
180.0000 mg | ORAL_CAPSULE | Freq: Every day | ORAL | Status: DC
  Administered 2021-04-17: 180 mg via ORAL
  Filled 2021-04-17: qty 1

## 2021-04-17 MED ORDER — DULOXETINE HCL 60 MG PO CPEP
60.0000 mg | ORAL_CAPSULE | Freq: Every day | ORAL | Status: DC
  Filled 2021-04-17: qty 1

## 2021-04-17 MED ORDER — LACTULOSE 10 GM/15ML PO SOLN
10.0000 g | Freq: Every day | ORAL | Status: DC | PRN
  Filled 2021-04-17: qty 15

## 2021-04-17 MED ORDER — CARVEDILOL 12.5 MG PO TABS
25.0000 mg | ORAL_TABLET | Freq: Two times a day (BID) | ORAL | Status: DC
  Administered 2021-04-17 (×2): 25 mg via ORAL
  Filled 2021-04-17 (×2): qty 2

## 2021-04-17 MED ORDER — HYDROXYCHLOROQUINE SULFATE 200 MG PO TABS
200.0000 mg | ORAL_TABLET | Freq: Every day | ORAL | Status: DC
  Administered 2021-04-17: 200 mg via ORAL
  Filled 2021-04-17: qty 1

## 2021-04-17 NOTE — ED Notes (Signed)
CSW consult, needs placement

## 2021-04-17 NOTE — ED Notes (Signed)
Breakfast order placed ?

## 2021-04-17 NOTE — ED Notes (Signed)
Attempted report no one responding the phone on the nurse station, receptionist notified to call back for report.

## 2021-04-17 NOTE — ED Notes (Signed)
[name] daughter called and notified about pt leaving the ED by ambulance to [facility] place.

## 2021-04-17 NOTE — Progress Notes (Addendum)
Pt going to [facility] Room 705 Daughter updated.

## 2021-04-17 NOTE — ED Notes (Signed)
Patient remains A/AOx4. No distress.

## 2021-04-17 NOTE — Progress Notes (Signed)
TOC CSW received [place] approval from [place] with the following information for [employer].  Auth:  [id number]  Reference #:  [id number]  Start Date:  04/17/2021  Review Date:  04/19/2021  Reviewer:  Darnelle Bos  Fax #:  [phone]  Anne Boltz Tarpley-Carter, MSW, LCSW-A Pronouns:  She, Her, Hers                  [facility] ED Transitions of CareClinical Social Worker [email or web address] [phone]

## 2021-04-17 NOTE — ED Provider Notes (Addendum)
Emergency Medicine Observation Re-evaluation Note  Michelle Graves is a 70 y.o. female, seen on rounds today.  Pt initially presented to the ED for complaints of Weakness Currently, the patient is waiting in the ED for SNF placement.  Physical Exam  BP (!) 166/97   Pulse 71   Temp 98.6 F (37 C) (Oral)   Resp 18   Ht 1.676 m (5\' 6" )   Wt 83.9 kg   SpO2 100%   BMI 29.86 kg/m  Physical Exam General: resting in bed, alert, no acute distress  Cardiac: regular rate Lungs: no stridor, normal rate Psych: normal mood  ED Course / MDM  EKG:   I have reviewed the labs performed to date as well as medications administered while in observation.  Recent changes in the last 24 hours include assessment by social work yesterday.  Referrals sent to SNF.  Home medications ordered.   Plan  Current plan is for SNF.    , MD 04/17/21 [phone]  Notified that patient has been accepted at [facility].  Anticipate dc today.   [facility], MD 04/17/21 [phone]

## 2021-04-17 NOTE — Progress Notes (Signed)
TOC CSW spoke with Fransico Him and started [place] for [employer] per [name] McRae/pts daughter [phone].  Reference #:  [id number]  Klayten Jolliff Tarpley-Carter, MSW, LCSW-A Pronouns:  She, Her, Hers                  [facility] ED Transitions of CareClinical Social Worker [email or web address] [phone]

## 2021-04-17 NOTE — Discharge Instructions (Signed)
Continue your current medications.  Follow-up with your primary care doctor to be rechecked. 

## 2021-04-17 NOTE — ED Notes (Signed)
This RN assisted pt in using bed pan. Pt also moved on to hospital bed.

## 2021-04-30 IMAGING — US US RENAL
1 series · 14 of 25 positions shown · non-contrast
Comparison: CT scan September 30, 2019. Renal ultrasound March 24, 2019.

CLINICAL DATA: Acute renal insufficiency

EXAM:
RENAL / URINARY TRACT ULTRASOUND COMPLETE

[Series 1: us renal · 14 of 38 slices shown]
[im 1/38]
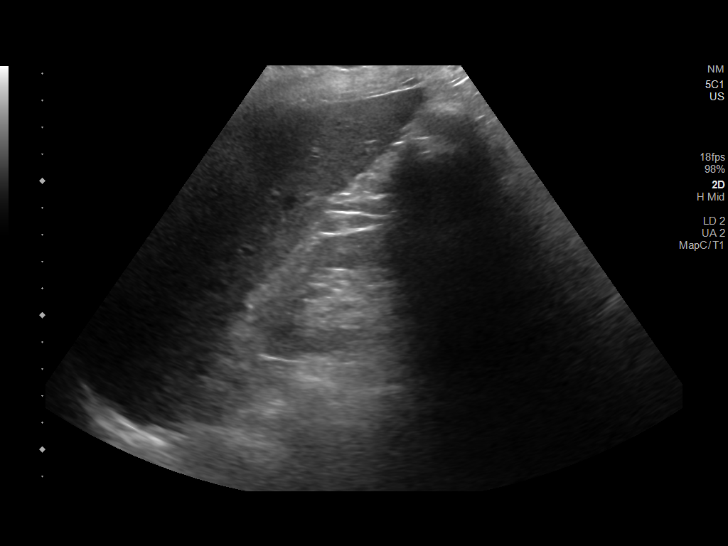
[im 4/38]
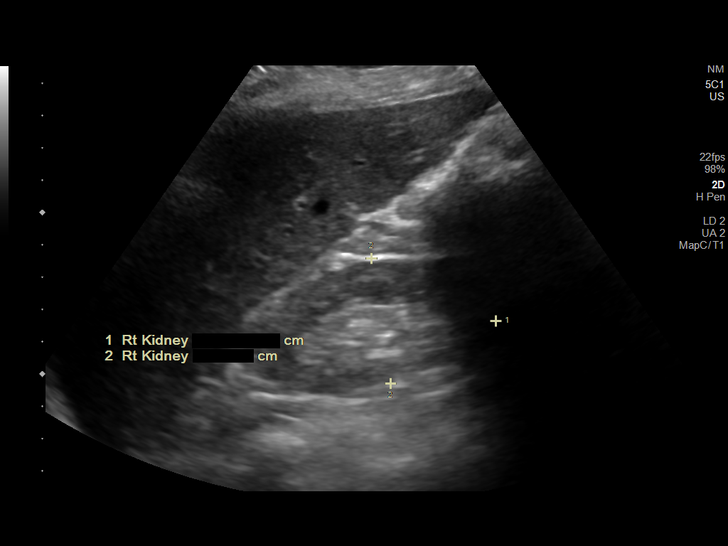
[im 7/38]
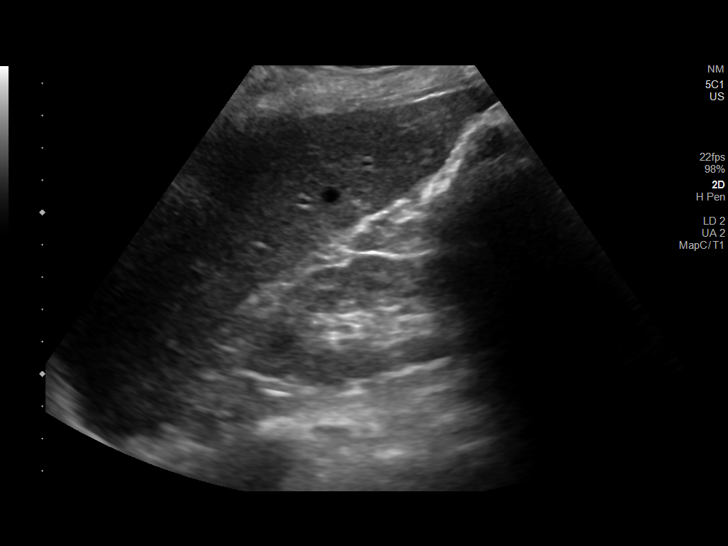
[im 10/38]
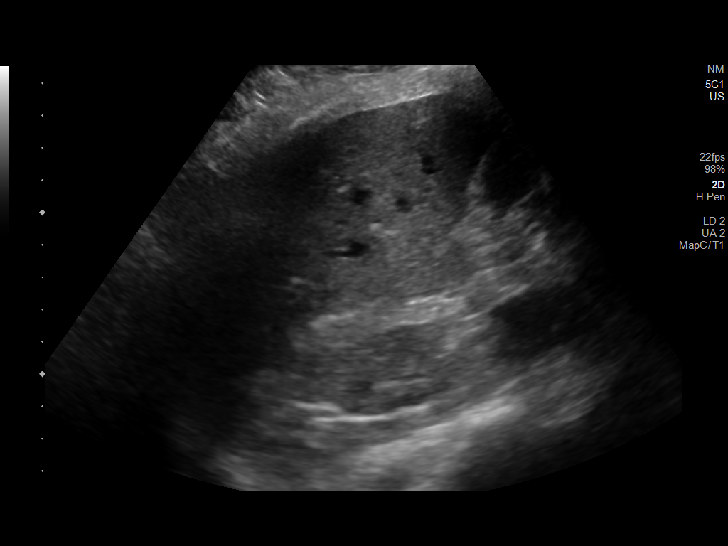
[im 13/38]
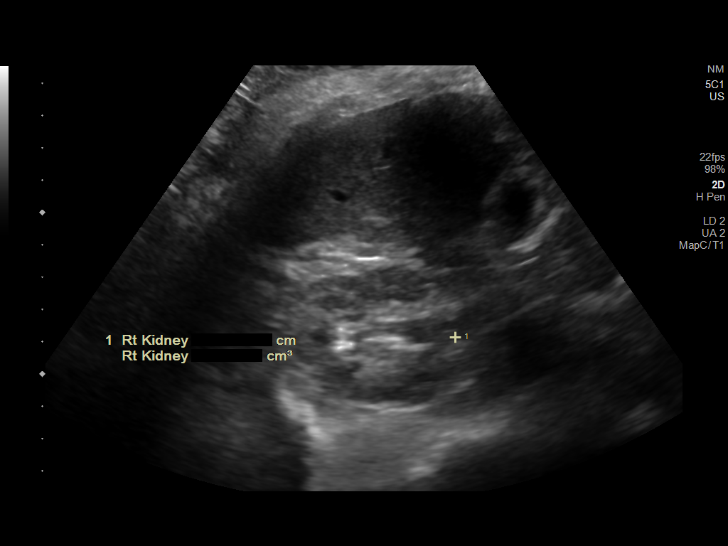
[im 14/38]
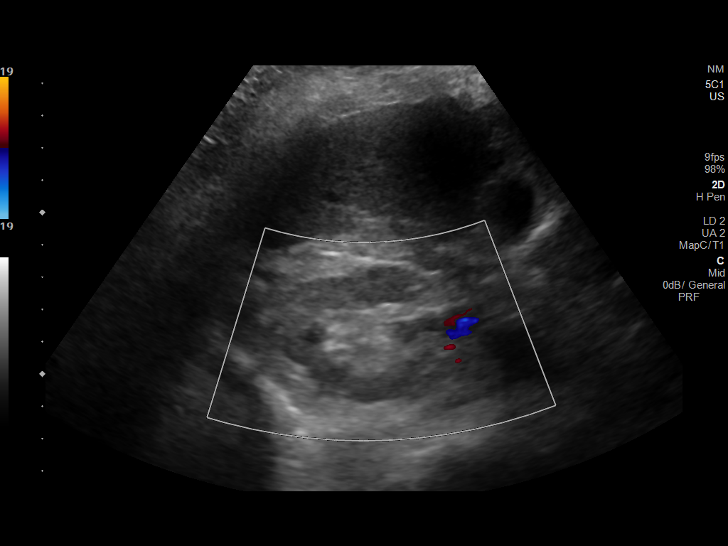
[im 17/38]
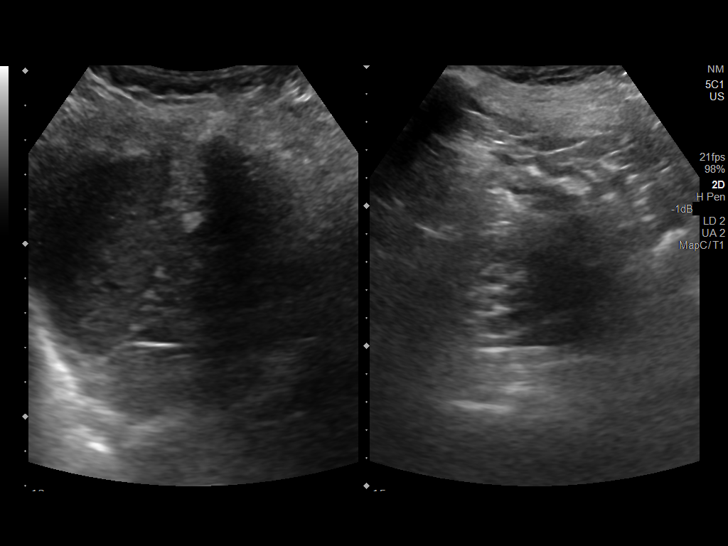
[im 21/38]
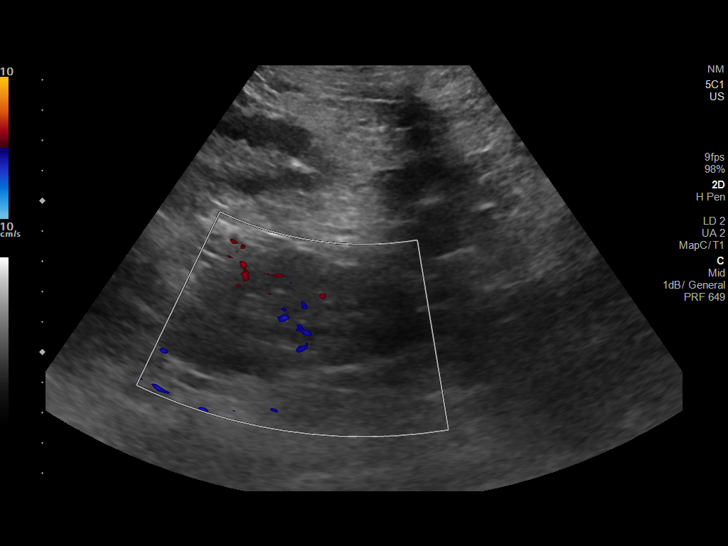
[im 24/38]
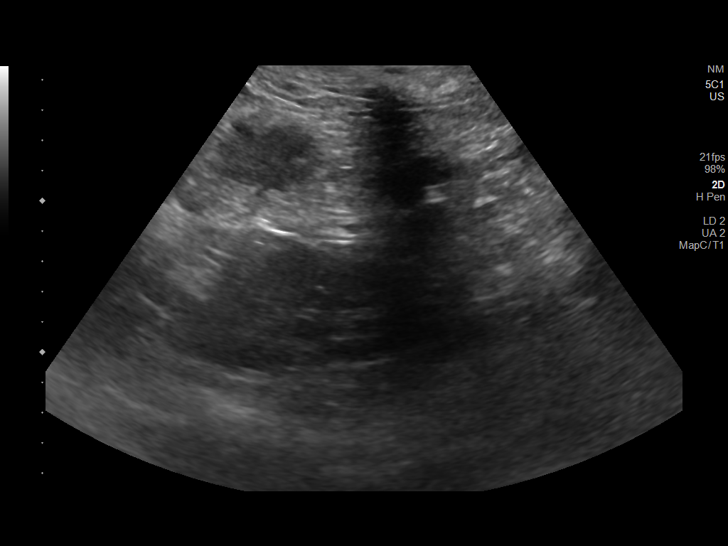
[im 25/38]
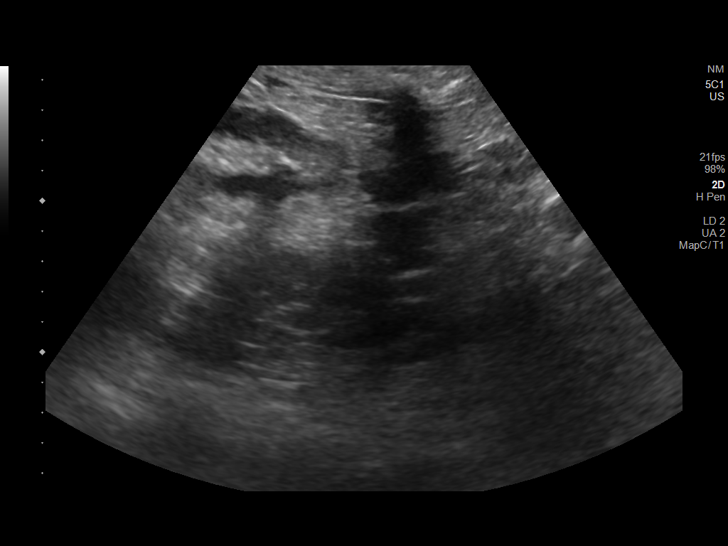
[im 28/38]
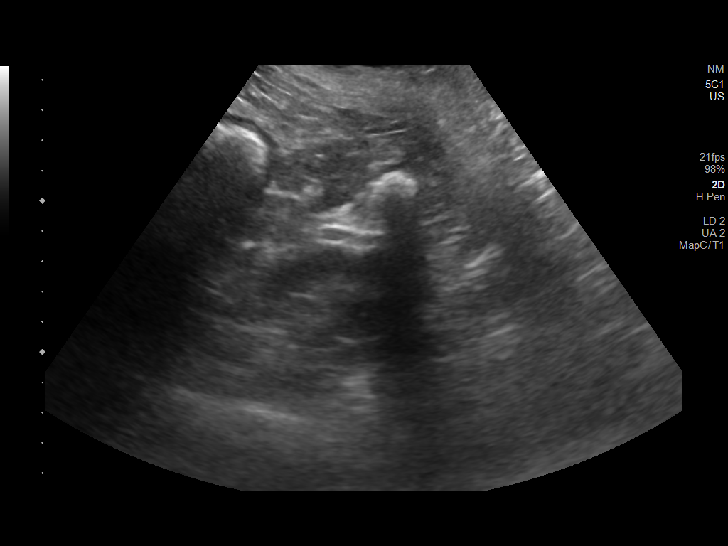
[im 31/38]
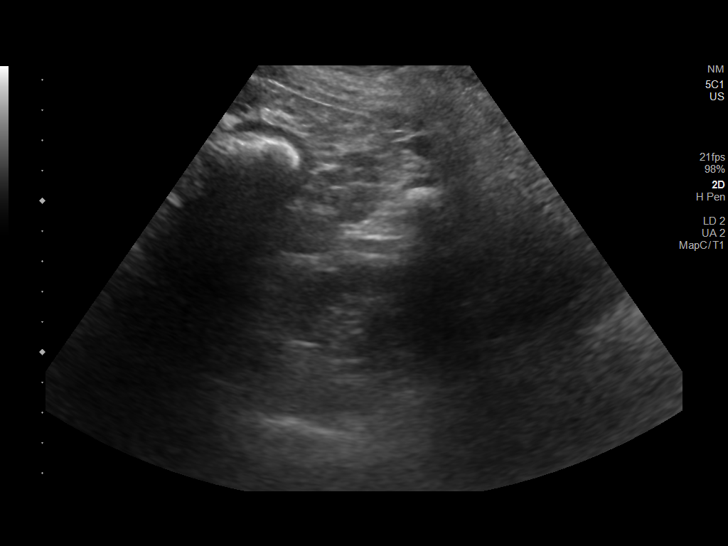
[im 34/38]
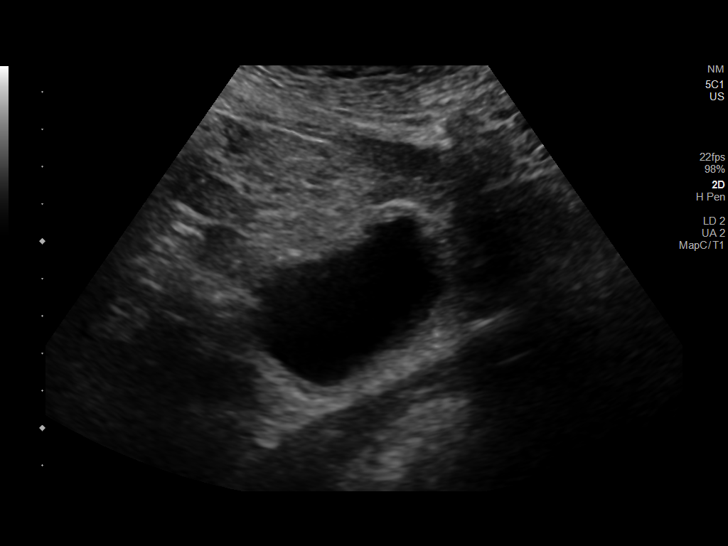
[im 38/38]
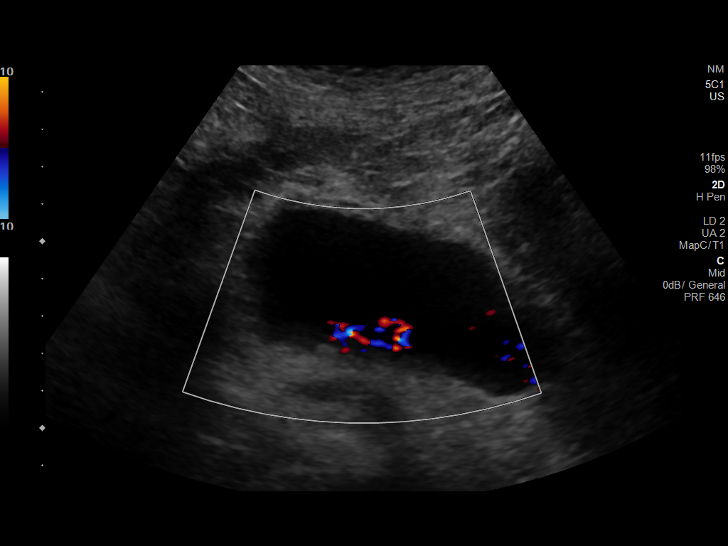

[14 of 25 positions shown; findings below may reference images not displayed]

FINDINGS: Right Kidney:

Renal measurements: 7.8 x 3.9 x 5.3 cm = volume: 83.7 mL .
Echogenicity within normal limits. No mass or hydronephrosis
visualized.

Left Kidney:

Renal measurements: 9.5 x 4.2 x 4.6 cm = volume: 92.9 mL.
Echogenicity within normal limits. No mass or hydronephrosis
visualized.

Bladder:

Appears normal for degree of bladder distention.

Other:

None.
IMPRESSION: 1. The right kidney measured 7.8 cm today which would be small.
However, the right kidney measured 10.6 cm on the March 24, 2019
ultrasound and appeared to be normal in size on today's CT scan.
Today's measurement of the right kidney is artifactual.
2. No acute abnormalities.

## 2021-05-11 IMAGING — MG DIGITAL SCREENING BILAT W/ TOMO W/ CAD
8 series · 8 of 24 positions shown · non-contrast
Comparison: Previous exam(s).

CLINICAL DATA: Screening.

EXAM:
DIGITAL SCREENING BILATERAL MAMMOGRAM WITH TOMO AND CAD

[L CC synth-2D]
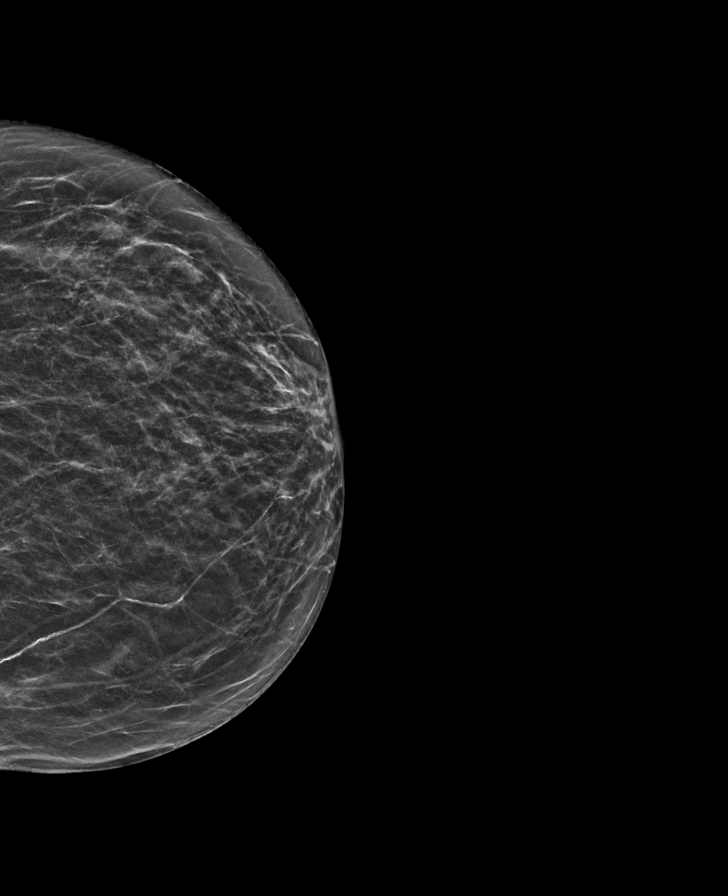

[R MLO synth-2D]
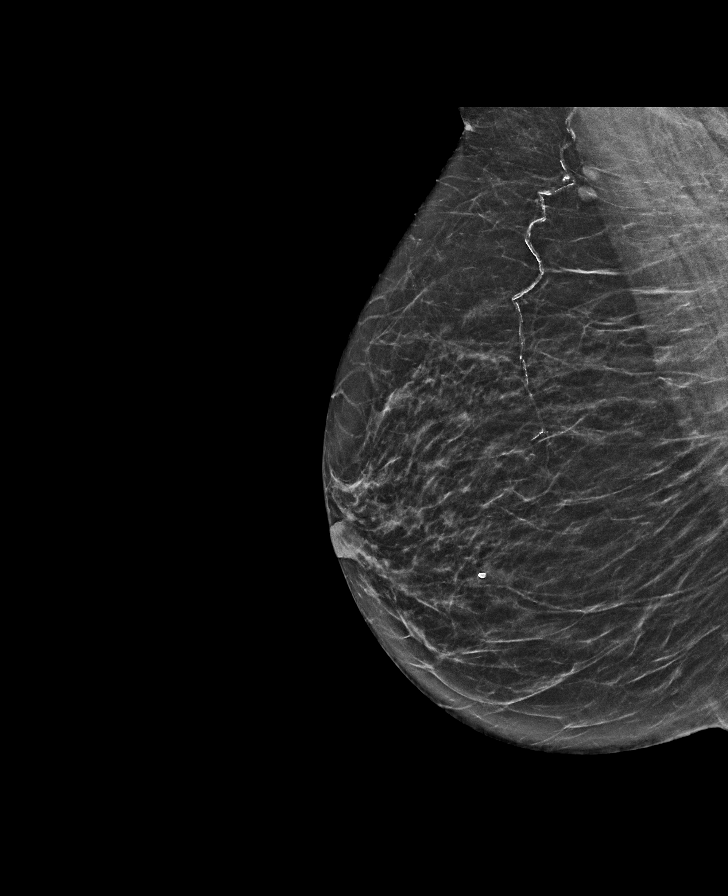

[R CC synth-2D]
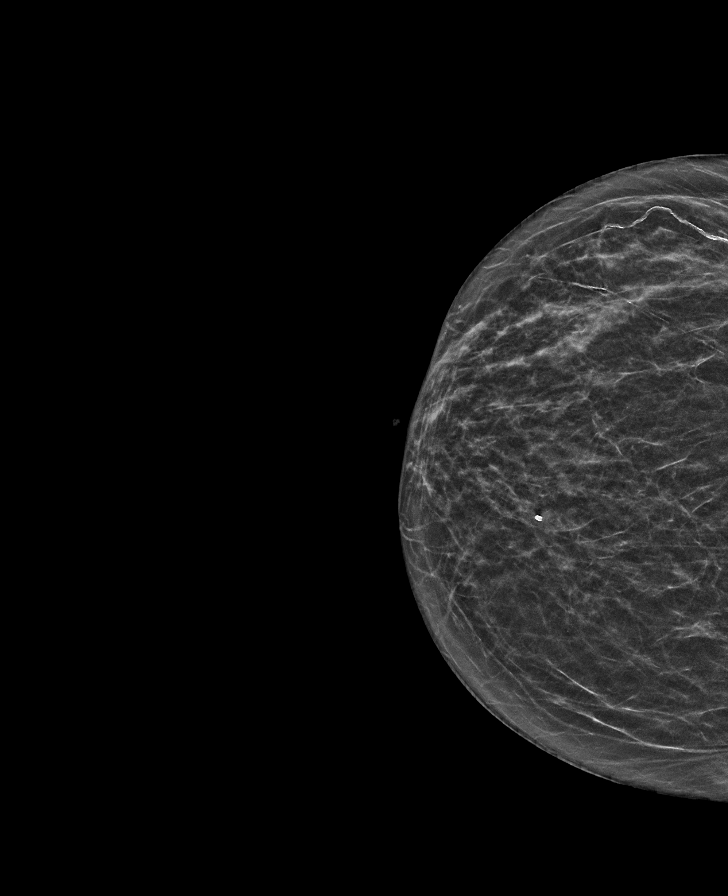

[L MLO synth-2D]
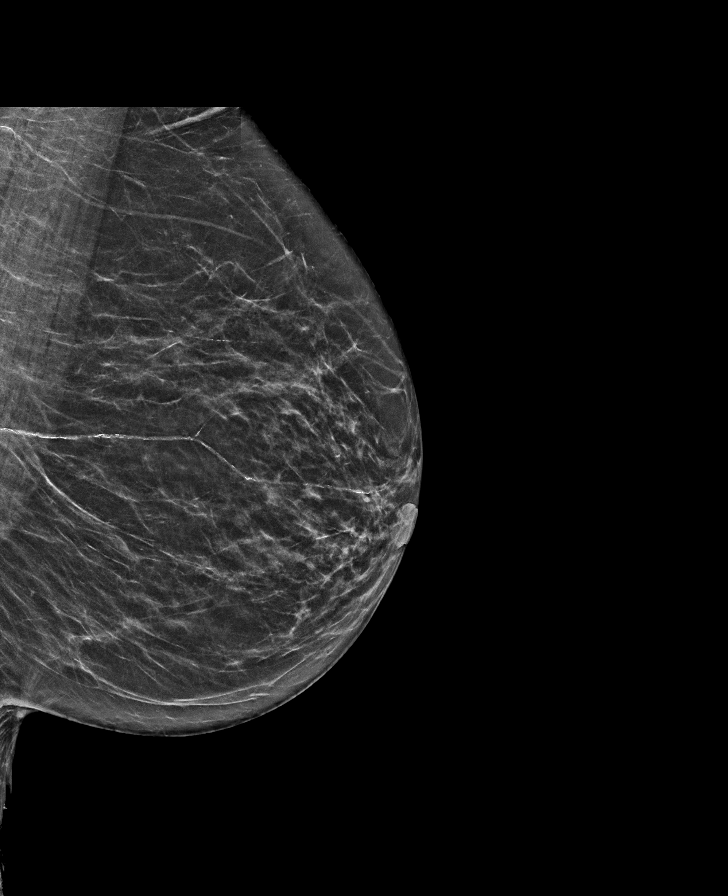

[L MLO tomo · tomo slice 29/58.0]
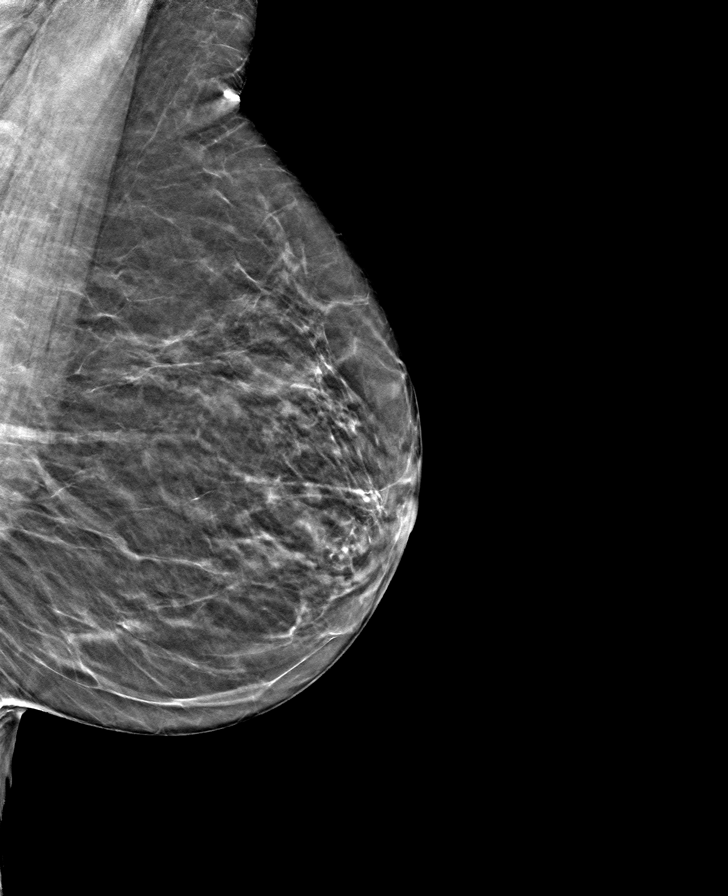

[R CC tomo · tomo slice 25/48.0]
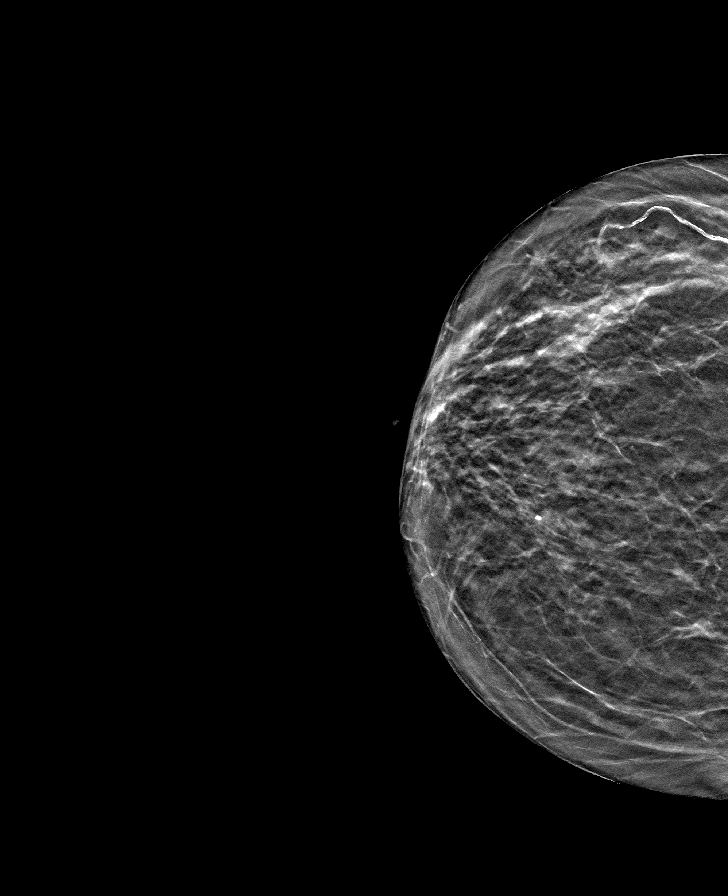

[R MLO tomo · tomo slice 29/57.0]
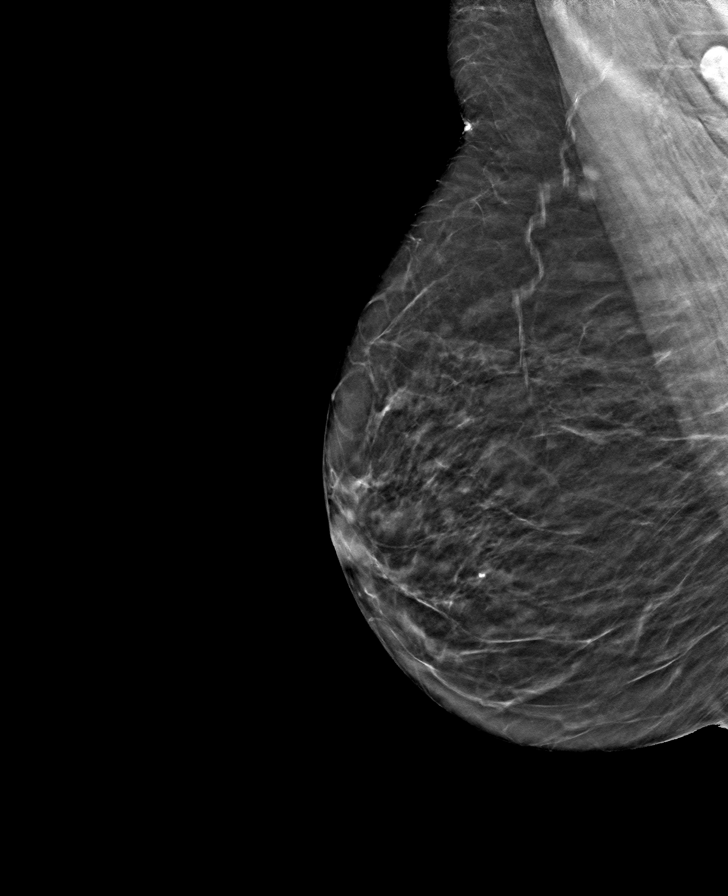

[L CC tomo · tomo slice 26/51.0]
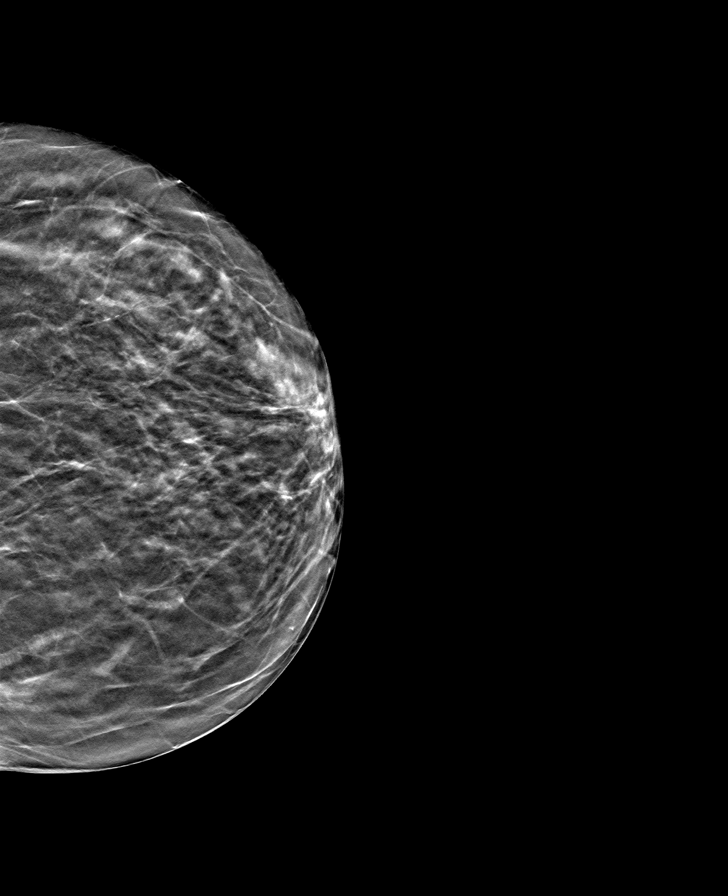

[8 of 24 positions shown; findings below may reference images not displayed]

ACR Breast Density Category b: There are scattered areas of
fibroglandular density.
FINDINGS: There are no findings suspicious for malignancy. Images were
processed with CAD.
IMPRESSION: No mammographic evidence of malignancy. A result letter of this
screening mammogram will be mailed directly to the patient.

RECOMMENDATION:
Screening mammogram in one year. (Code:CN-U-775)

BI-RADS CATEGORY  1: Negative.

## 2021-05-25 ENCOUNTER — Other Ambulatory Visit: Payer: Self-pay | Admitting: Neurology

## 2021-05-25 DIAGNOSIS — I639 Cerebral infarction, unspecified: Secondary | ICD-10-CM

## 2021-05-26 IMAGING — CT CT RENAL STONE PROTOCOL
2 of 4 series · 16 of 46 positions shown, 18 images · non-contrast
Comparison: 09/30/2019

CLINICAL DATA: Flank pain, nausea

EXAM:
CT ABDOMEN AND PELVIS WITHOUT CONTRAST
TECHNIQUE: Multidetector CT imaging of the abdomen and pelvis was performed
following the standard protocol without IV contrast.

[Series 3: ap without · axial · non-contrast · 0.68mm/px · z∈[+614,+1014]mm · 13 of 91 slices shown, 15 images]
[im 6/91  soft-tissue]
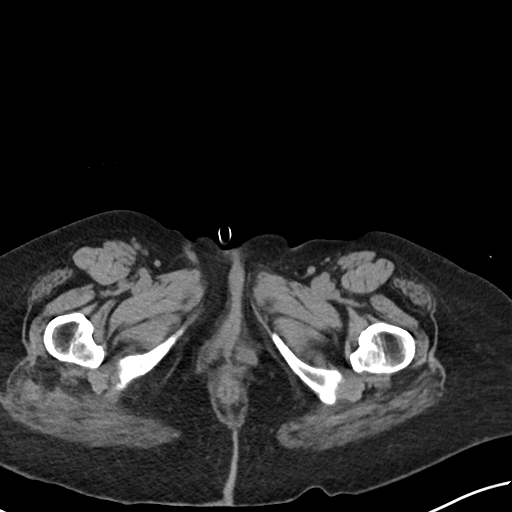
[im 6/91  bone]
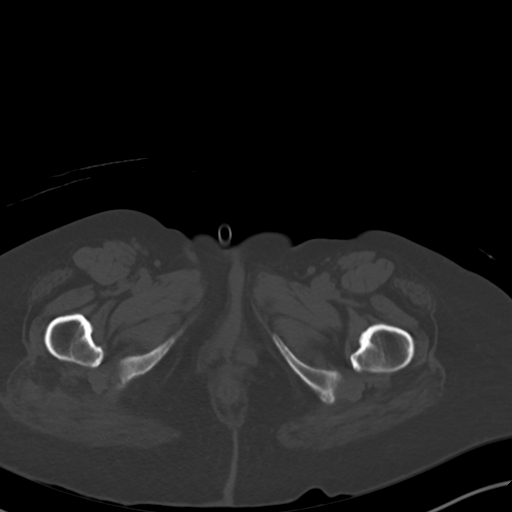
[im 11/91  soft-tissue]
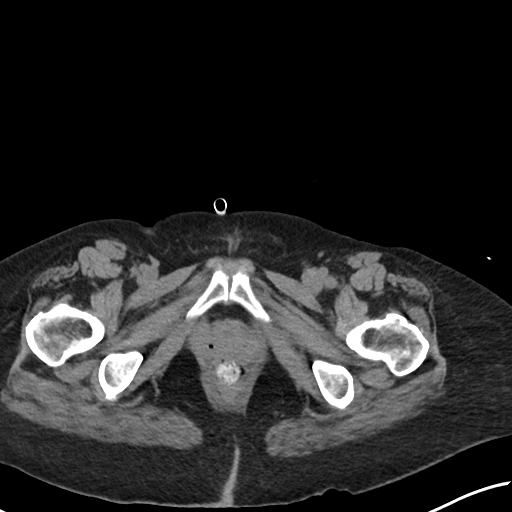
[im 21/91  soft-tissue]
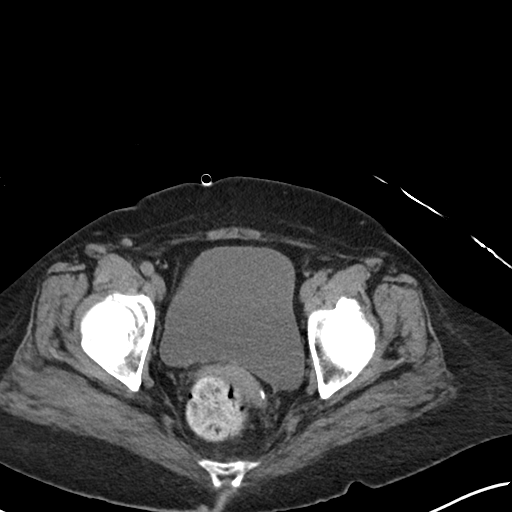
[im 26/91  soft-tissue]
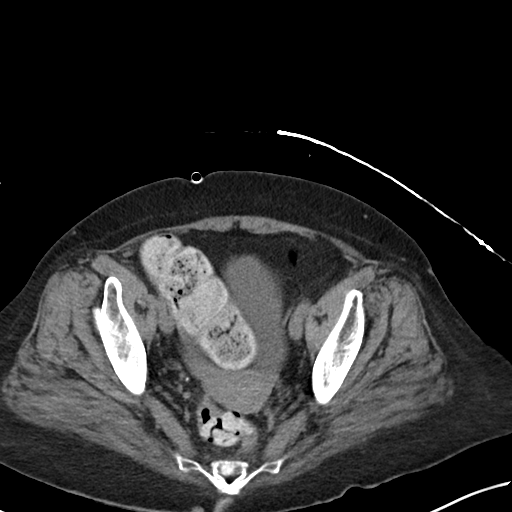
[im 31/91  soft-tissue]
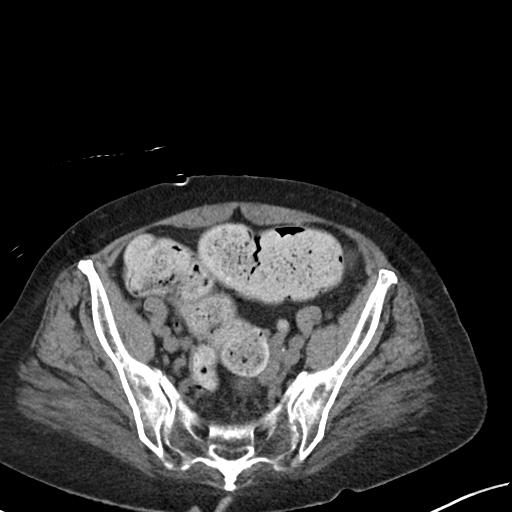
[im 41/91  soft-tissue]
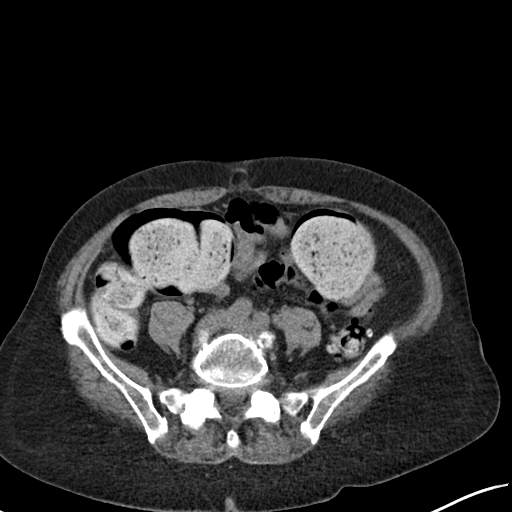
[im 46/91  soft-tissue]
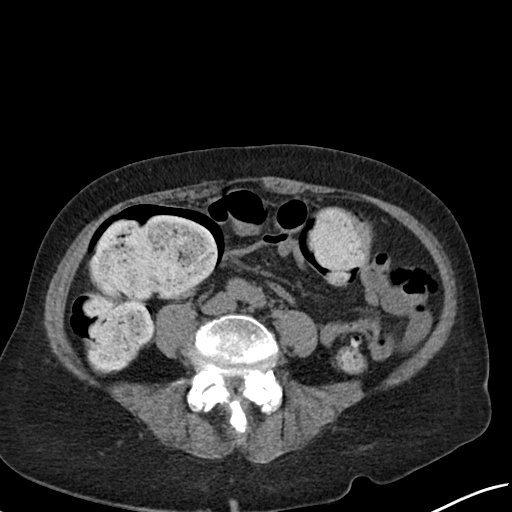
[im 51/91  soft-tissue]
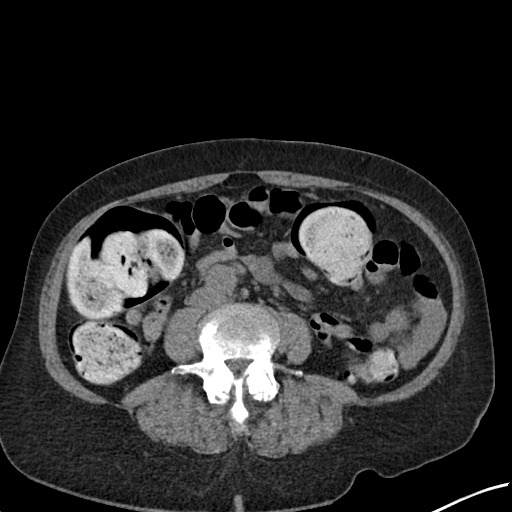
[im 61/91  soft-tissue]
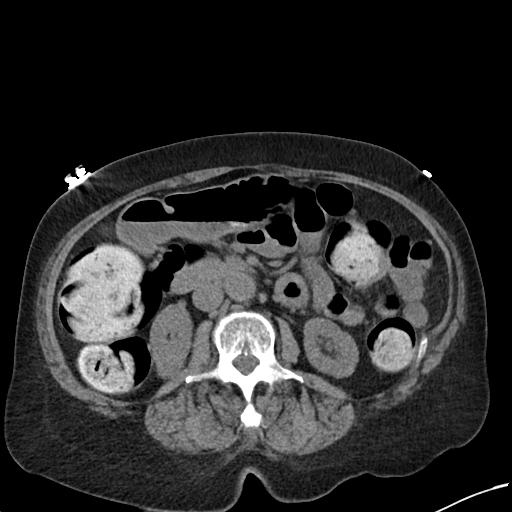
[im 61/91  bone]
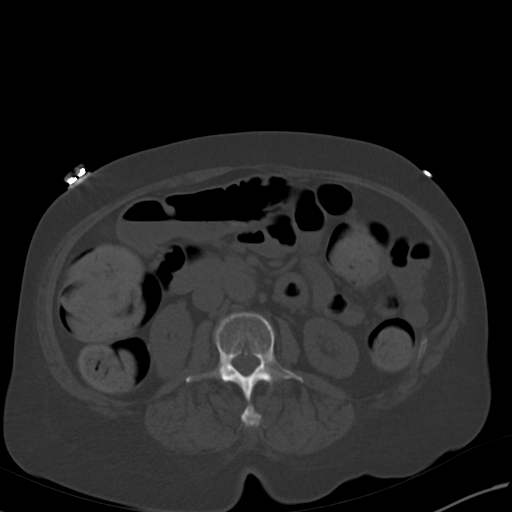
[im 66/91  soft-tissue]
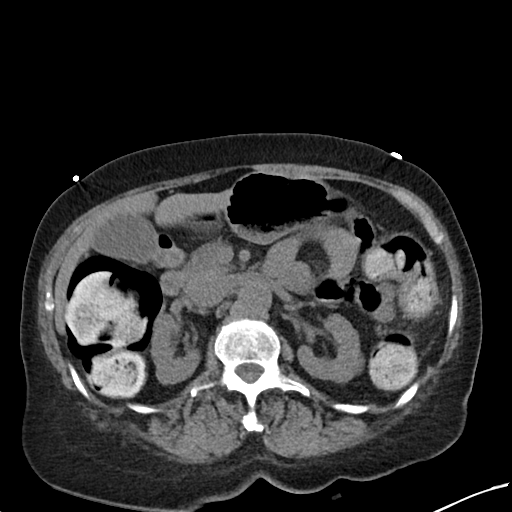
[im 71/91  soft-tissue]
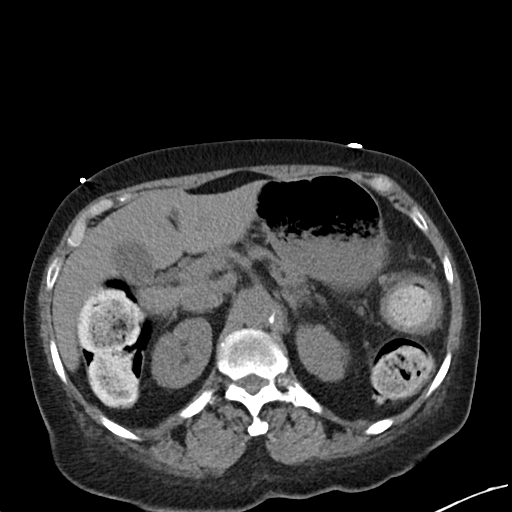
[im 81/91  soft-tissue]
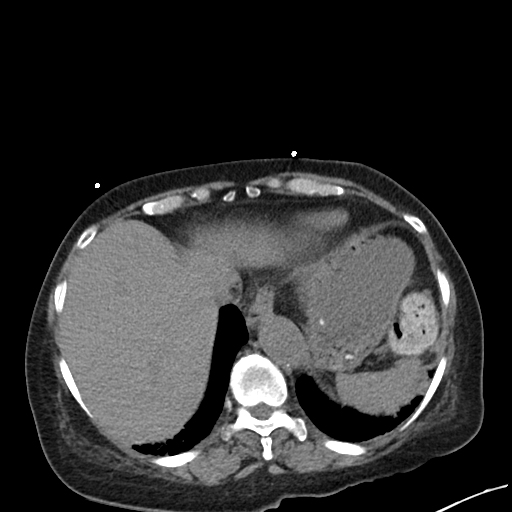
[im 86/91  soft-tissue]
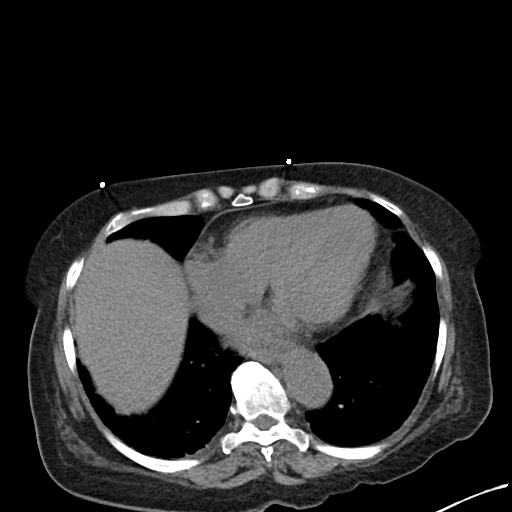

[Series 6: cor · coronal · 0.72mm/px · 3 of 88 slices shown]
[im 30/88  soft-tissue]
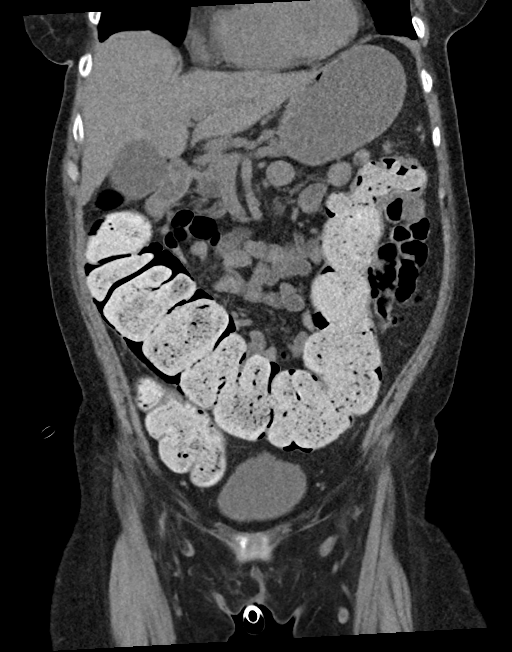
[im 39/88  soft-tissue]
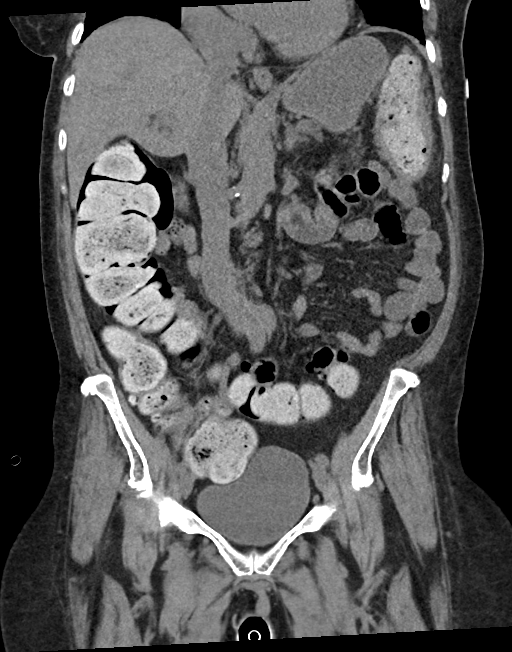
[im 49/88  soft-tissue]
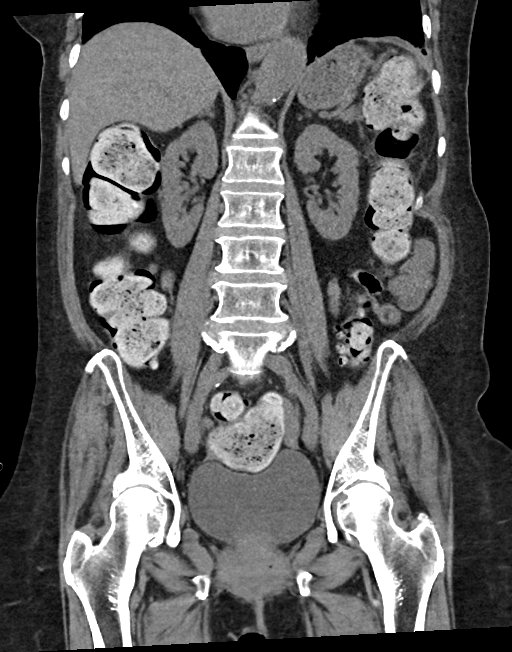

[16 of 46 positions shown; findings below may reference images not displayed]

FINDINGS: Lower chest: Scarring and or atelectasis within the lung bases. No
acute findings.

Hepatobiliary: No focal liver abnormality is seen. No gallstones,
gallbladder wall thickening, or biliary dilatation.

Pancreas: Unremarkable. No pancreatic ductal dilatation or
surrounding inflammatory changes.

Spleen: Normal in size without focal abnormality.

Adrenals/Urinary Tract: Adrenal glands are unremarkable. Kidneys are
normal, without renal calculi, focal lesion, or hydronephrosis.
Bladder is unremarkable.

Stomach/Bowel: Redemonstration of circumferential wall thickening
and pericolonic fat stranding involving the distal transverse colon
near the splenic flexure (series 3, image 22), which is similar in
appearance to the prior study. No evidence of perforation or
abscess. There are scattered colonic diverticula although no focally
inflamed diverticulum is appreciated at this segment. There is a
large volume of stool throughout the colon. Stomach and small bowel
appear within normal limits. Appendix is within normal limits. No
evidence of bowel obstruction.

Vascular/Lymphatic: No significant vascular findings are present. No
enlarged abdominal or pelvic lymph nodes.

Reproductive: Uterus and bilateral adnexa are unremarkable.

Other: No abdominal wall hernia or abnormality. No abdominopelvic
ascites.

Musculoskeletal: No acute or significant osseous findings.
IMPRESSION: 1. Redemonstration of circumferential wall thickening and
pericolonic fat stranding involving the distal transverse colon near
the splenic flexure, which is similar in appearance to the prior
study. No evidence of perforation or abscess. Findings may represent
a focal colitis versus diverticulitis, although no focally inflamed
diverticulum is seen at this site.
2. Large volume of stool throughout the colon suggesting chronic
constipation.

## 2021-05-27 ENCOUNTER — Other Ambulatory Visit: Payer: Self-pay | Admitting: Emergency Medicine

## 2021-06-07 IMAGING — DX DG CHEST 1V PORT
1 series · 1 of 1 positions shown · non-contrast
Comparison: 06/16/2019

CLINICAL DATA: Weakness

EXAM:
PORTABLE CHEST 1 VIEW

[chest ap]
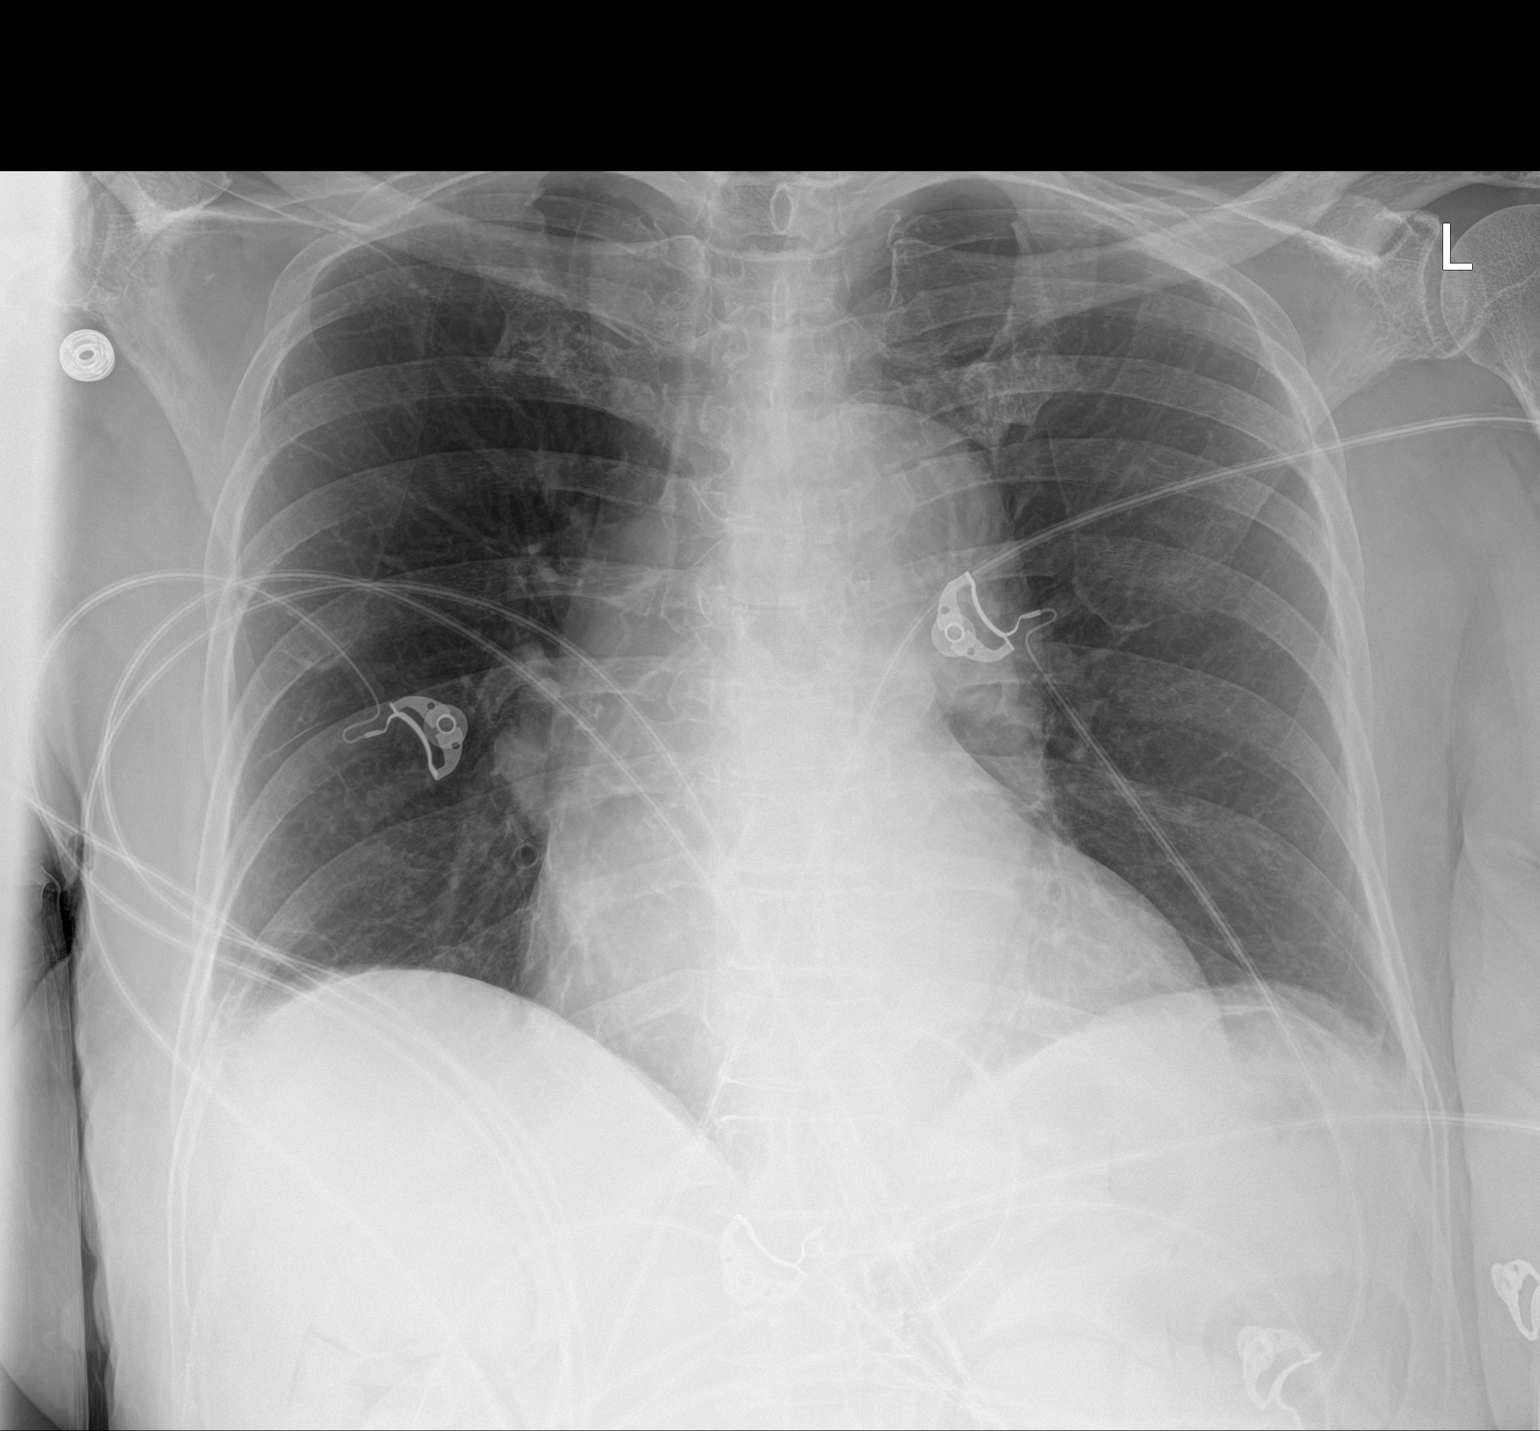

[1 of 1 positions shown; findings below may reference images not displayed]

FINDINGS: Borderline heart size. Aortic tortuosity. There is no edema,
consolidation, effusion, or pneumothorax.
IMPRESSION: No evidence of acute disease.

## 2021-06-17 ENCOUNTER — Encounter (HOSPITAL_COMMUNITY): Payer: Self-pay | Admitting: Emergency Medicine

## 2021-06-17 ENCOUNTER — Other Ambulatory Visit: Payer: Self-pay

## 2021-06-17 ENCOUNTER — Emergency Department (HOSPITAL_COMMUNITY)
Admission: EM | Admit: 2021-06-17 | Discharge: 2021-06-18 | Disposition: A | Discharge status: Home / Self Care | Payer: Medicare Other | Attending: Emergency Medicine | Admitting: Emergency Medicine

## 2021-06-17 DIAGNOSIS — R339 Retention of urine, unspecified: Secondary | ICD-10-CM | POA: Insufficient documentation

## 2021-06-17 DIAGNOSIS — R531 Weakness: Secondary | ICD-10-CM | POA: Diagnosis not present

## 2021-06-17 DIAGNOSIS — M549 Dorsalgia, unspecified: Secondary | ICD-10-CM | POA: Insufficient documentation

## 2021-06-17 DIAGNOSIS — M48061 Spinal stenosis, lumbar region without neurogenic claudication: Secondary | ICD-10-CM

## 2021-06-17 DIAGNOSIS — Z7902 Long term (current) use of antithrombotics/antiplatelets: Secondary | ICD-10-CM | POA: Insufficient documentation

## 2021-06-17 DIAGNOSIS — N183 Chronic kidney disease, stage 3 unspecified: Secondary | ICD-10-CM | POA: Diagnosis not present

## 2021-06-17 DIAGNOSIS — G8929 Other chronic pain: Secondary | ICD-10-CM | POA: Insufficient documentation

## 2021-06-17 DIAGNOSIS — I129 Hypertensive chronic kidney disease with stage 1 through stage 4 chronic kidney disease, or unspecified chronic kidney disease: Secondary | ICD-10-CM | POA: Diagnosis not present

## 2021-06-17 DIAGNOSIS — Z79899 Other long term (current) drug therapy: Secondary | ICD-10-CM | POA: Diagnosis not present

## 2021-06-17 NOTE — ED Provider Notes (Signed)
Emergency Medicine Provider Triage Evaluation Note  Michelle Graves , a 70 y.o. female  was evaluated in triage.  Pt complains of low back pain.  She has chronic back pain, has seen orthopedics for this based on chart review it appears she has seen [facility] as recently as 06/06/2021.  She states that her pain worsened on Sunday.  She denies any trauma or cause for her pain worsening.  She states that her pain is in the same location as her chronic back pain and descriptively feels the same but the intensity of pain is worse.  He denies any changes to bowel or bladder function.  No abdominal pain. She states that she is able to walk however it is very painful when she tries.  She denies any new weakness or numbness.  Review of Systems  Positive: Worsening of chronic low back pain Negative: Fevers, changes to bowel or bladder function.   Physical Exam  BP 137/90   Pulse 92   Temp 98.6 F (37 C)   Resp 17   Ht 5\' 6"  (1.676 m)   Wt 92 kg   SpO2 94%   BMI 32.74 kg/m  Gen:   Awake, no distress   Resp:  Normal effort  MSK:   Moves extremities without difficulty able to lift bilateral legs.  Other:  Speech is not slurred.   Medical Decision Making  Medically screening exam initiated at 7:26 PM.  Appropriate orders placed.  Michelle Graves was informed that the remainder of the evaluation will be completed by another provider, this initial triage assessment does not replace that evaluation, and the importance of remaining in the ED until their evaluation is complete.  Note: Portions of this report may have been transcribed using voice recognition software. Every effort was made to ensure accuracy; however, inadvertent computerized transcription errors may be present    Michelle Graves 06/17/21 1926    06/19/21, MD 06/17/21 [phone]

## 2021-06-17 NOTE — ED Triage Notes (Signed)
Patient reports worsening chronic low back pain onset last week , no injury or fall .

## 2021-06-18 ENCOUNTER — Emergency Department (HOSPITAL_COMMUNITY): Payer: Medicare Other

## 2021-06-18 LAB — URINALYSIS, ROUTINE W REFLEX MICROSCOPIC
Bilirubin Urine: NEGATIVE
Glucose, UA: NEGATIVE mg/dL
Hgb urine dipstick: NEGATIVE
Ketones, ur: NEGATIVE mg/dL
Nitrite: POSITIVE — AB
Protein, ur: NEGATIVE mg/dL
Specific Gravity, Urine: 1.012 (ref 1.005–1.030)
pH: 5 (ref 5.0–8.0)

## 2021-06-18 LAB — CBC WITH DIFFERENTIAL/PLATELET
Abs Immature Granulocytes: 0.05 10*3/uL (ref 0.00–0.07)
Basophils Absolute: 0.1 10*3/uL (ref 0.0–0.1)
Basophils Relative: 1 %
Eosinophils Absolute: 0.1 10*3/uL (ref 0.0–0.5)
Eosinophils Relative: 3 %
HCT: 41.9 % (ref 36.0–46.0)
Hemoglobin: 13 g/dL (ref 12.0–15.0)
Immature Granulocytes: 1 %
Lymphocytes Relative: 31 %
Lymphs Abs: 1.7 10*3/uL (ref 0.7–4.0)
MCH: 29.2 pg (ref 26.0–34.0)
MCHC: 31 g/dL (ref 30.0–36.0)
MCV: 94.2 fL (ref 80.0–100.0)
Monocytes Absolute: 0.9 10*3/uL (ref 0.1–1.0)
Monocytes Relative: 17 %
Neutro Abs: 2.7 10*3/uL (ref 1.7–7.7)
Neutrophils Relative %: 47 %
Platelets: UNDETERMINED 10*3/uL (ref 150–400)
RBC: 4.45 MIL/uL (ref 3.87–5.11)
RDW: 14.2 % (ref 11.5–15.5)
WBC: 5.6 10*3/uL (ref 4.0–10.5)
nRBC: 0 % (ref 0.0–0.2)

## 2021-06-18 MED ORDER — DEXAMETHASONE SODIUM PHOSPHATE 10 MG/ML IJ SOLN
10.0000 mg | Freq: Once | INTRAMUSCULAR | Status: AC
  Administered 2021-06-18: 10 mg via INTRAMUSCULAR
  Filled 2021-06-18: qty 1

## 2021-06-18 MED ORDER — DEXAMETHASONE SODIUM PHOSPHATE 10 MG/ML IJ SOLN: 10.0000 mg | Freq: Once | INTRAMUSCULAR | Status: DC

## 2021-06-18 MED ORDER — MORPHINE SULFATE (PF) 4 MG/ML IV SOLN
4.0000 mg | Freq: Once | INTRAVENOUS | Status: DC
Start: 2021-06-18 — End: 2021-06-18

## 2021-06-18 MED ORDER — HYDROMORPHONE HCL 1 MG/ML IJ SOLN
1.0000 mg | Freq: Once | INTRAMUSCULAR | Status: AC
  Administered 2021-06-18: 1 mg via SUBCUTANEOUS
  Filled 2021-06-18: qty 1

## 2021-06-18 NOTE — ED Provider Notes (Signed)
Signout note  70 year old lady presented to ER with concern for acute on chronic low back pain.  PVR normal.  MRI ordered to further evaluate.    7:00 AM received sign out from [place] - f/u on MRI  9:00 AM reviewed results, will consult NSGY   IMPRESSION: 1. Compression deformity of the L5 vertebral body with mild bony retropulsion and grade 1 anterolisthesis of L4 on L5 are not significantly changed since 12/15/2020. Superimposed degenerative changes at this level described above result in severe spinal canal stenosis with significant crowding of the cauda equina nerve roots and severe left worse than right neural foraminal stenosis with impingement of the left L4 nerve root, progressed since 2019. 2. Moderate spinal canal stenosis and moderate left worse than right neural foraminal stenosis at L3-L4 due to above described degenerative changes superimposed on congenital canal narrowing, slightly progressed since 2019. 3. Bilateral facet arthropathy most advanced at L3-L4 and L4-L5 with associated facet joint effusions and perifacetal soft tissue edema, most prominent on the right at L4-L5. 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  Discussed with Ostergard - he will come evaluate patient  10:30 AM d/w Ostergard, no emergent surgery needed, rec dose of decadron now, f/u in his clinic and keep ESI appt; pt pain well controlled, able to ambulate, will dc home   Milagros Loll, MD 06/18/21 1224

## 2021-06-18 NOTE — Discharge Instructions (Addendum)
Please follow-up both with your original pain/spine clinic for the injection.  I would additionally recommend following up with the neurosurgeon that you met today.  Contact his clinic later today to request an appointment.  If you develop uncontrolled pain, numbness, weakness, bladder or bowel incontinence or urinary retention, return immediately to ER for reassessment.

## 2021-06-18 NOTE — ED Provider Notes (Signed)
[facility] EMERGENCY DEPARTMENT Provider Note   CSN: [id number] Arrival date & time: 06/17/21  7425     History Chief Complaint  Patient presents with   Back Pain    KONSTANTINA NACHREINER is a 70 y.o. female.  The history is provided by the patient and medical records.  Back Pain RAGEN LAVER is a 70 y.o. female who presents to the Emergency Department complaining of back pain.  She presents to the ED complaining of mid back pain that started last night.  She was turning over in bed and the pain significantly worsened and she could not get up to use the bathroom due to significant back pain.    No weakness, numbness.  Has difficulty with emptying her bladder.  No fever, N/V, chest pain/sob, abdominal pain.  Takes hydrocodone bid - took one today but only had mild improvement in pain.    Has chronic back pain and has an orthopedic doctor.  Today's pain is significantly different.  Pain now radiates to her groin, which is new.  Last spinal injections were a few months ago.  No dysuria.      Past Medical History:  Diagnosis Date   Acute on chronic kidney failure (HCC)    Anemia    Arthritis    Chest pain    Diverticulitis    Hypertension    Stroke [facility])    UTI (urinary tract infection) 10/2019    Patient Active Problem List   Diagnosis Date Noted   Acute alteration in mental status    Uncontrolled hypertension    Altered mental status 03/19/2021   Multinodular goiter 02/25/2021   Acute metabolic encephalopathy 01/21/2021   Hypertensive urgency 01/21/2021   Sepsis secondary to UTI (HCC) 01/21/2021   Adrenal insufficiency (HCC) 12/18/2020   Encephalopathy 12/17/2020   TIA (transient ischemic attack) 12/15/2020   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   Bowel obstruction (HCC) 05/06/2020   SIRS (systemic inflammatory response syndrome) (HCC) 05/06/2020   UTI (urinary tract infection) 11/08/2019   Major depressive disorder, recurrent episode, moderate  (HCC) 11/08/2019   Urinary retention 10/28/2019   Acute on chronic kidney failure (HCC) 10/27/2019   Malnutrition of moderate degree 10/03/2019   Colitis 09/30/2019   AKI (acute kidney injury) (HCC) 05/28/2019   Nausea & vomiting 05/28/2019   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   Anemia, chronic disease 05/28/2019   Swelling    Small vessel disease, cerebrovascular    Acute ischemic stroke (HCC)    Weakness    Acute left ankle pain    Acute on chronic renal failure (HCC) 05/13/2019   Weakness of both lower extremities 05/13/2019   Cerebral thrombosis with cerebral infarction 03/31/2019   New onset a-fib (HCC) 03/27/2019   Acute kidney injury superimposed on CKD (HCC) 03/24/2019   Hypotension 03/24/2019   Hematemesis 03/24/2019   Dark stools 03/24/2019   Diverticulitis of intestine without perforation or abscess without bleeding    Sinus tachycardia 08/27/2016   Diverticulitis 08/26/2016   HYPERTENSION, MALIGNANT ESSENTIAL 06/24/2007   CKD (chronic kidney disease), stage III (HCC) 06/24/2007   Rheumatoid arthritis (HCC) 06/24/2007    Past Surgical History:  Procedure Laterality Date   BIOPSY  04/01/2019   Procedure: BIOPSY;  Surgeon: Kathi Der, MD;  Location: [facility] ENDOSCOPY;  Service: Gastroenterology;;   BIOPSY  10/05/2019   Procedure: BIOPSY;  Surgeon: Kathi Der, MD;  Location: [facility] ENDOSCOPY;  Service: Gastroenterology;;   BIOPSY  05/10/2020   Procedure: BIOPSY;  Surgeon:  Kathi Der, MD;  Location: [facility] ENDOSCOPY;  Service: Gastroenterology;;   BIOPSY  11/29/2020   Procedure: BIOPSY;  Surgeon: Charlott Rakes, MD;  Location: [facility] ENDOSCOPY;  Service: Gastroenterology;;   COLONOSCOPY N/A 11/29/2020   Procedure: COLONOSCOPY;  Surgeon: Charlott Rakes, MD;  Location: [facility] ENDOSCOPY;  Service: Gastroenterology;  Laterality: N/A;   COLONOSCOPY WITH PROPOFOL N/A 04/01/2019   Procedure: COLONOSCOPY WITH PROPOFOL;  Surgeon: Kathi Der, MD;  Location: [facility] ENDOSCOPY;   Service: Gastroenterology;  Laterality: N/A;   COLONOSCOPY WITH PROPOFOL N/A 10/05/2019   Procedure: COLONOSCOPY WITH PROPOFOL;  Surgeon: Kathi Der, MD;  Location: [facility] ENDOSCOPY;  Service: Gastroenterology;  Laterality: N/A;   COLONOSCOPY WITH PROPOFOL N/A 05/10/2020   Procedure: COLONOSCOPY WITH PROPOFOL;  Surgeon: Kathi Der, MD;  Location: [facility] ENDOSCOPY;  Service: Gastroenterology;  Laterality: N/A;   ESOPHAGOGASTRODUODENOSCOPY (EGD) WITH PROPOFOL N/A 04/01/2019   Procedure: ESOPHAGOGASTRODUODENOSCOPY (EGD) WITH PROPOFOL;  Surgeon: Kathi Der, MD;  Location: [facility] ENDOSCOPY;  Service: Gastroenterology;  Laterality: N/A;   ESOPHAGOGASTRODUODENOSCOPY (EGD) WITH PROPOFOL N/A 10/05/2019   Procedure: ESOPHAGOGASTRODUODENOSCOPY (EGD) WITH PROPOFOL;  Surgeon: Kathi Der, MD;  Location: [facility] ENDOSCOPY;  Service: Gastroenterology;  Laterality: N/A;   POLYPECTOMY  11/29/2020   Procedure: POLYPECTOMY;  Surgeon: Charlott Rakes, MD;  Location: [facility] ENDOSCOPY;  Service: Gastroenterology;;   SUBMUCOSAL TATTOO INJECTION  11/29/2020   Procedure: SUBMUCOSAL TATTOO INJECTION;  Surgeon: Charlott Rakes, MD;  Location: [facility] ENDOSCOPY;  Service: Gastroenterology;;   TONSILLECTOMY       OB History   No obstetric history on file.     Family History  Problem Relation Age of Onset   Hypertension Mother    Diabetes Mother    CAD Father        died of MI at age 8   Hypertension Father    Diabetes Sister    Diabetes Sister    Kidney disease Neg Hx    Adrenal disorder Neg Hx     Social History   Tobacco Use   Smoking status: Never   Smokeless tobacco: Never  Vaping Use   Vaping Use: Never used  Substance Use Topics   Alcohol use: Not Currently    Alcohol/week: 0.0 standard drinks    Comment: sometimes    Drug use: No    Home Medications Prior to Admission medications   Medication Sig Start Date End Date Taking? Authorizing Provider  atorvastatin (LIPITOR) 40 MG tablet TAKE 1  AND 1/2 TABLETS(60 MG) BY MOUTH DAILY 05/27/21   Micki Riley, MD  carvedilol (COREG) 25 MG tablet Take 1 tablet (25 mg total) by mouth 2 (two) times daily. 01/03/21   Fargo, Amy E, NP  clopidogrel (PLAVIX) 75 MG tablet Take 1 tablet (75 mg total) by mouth daily. 03/12/21   Micki Riley, MD  diltiazem (CARDIZEM CD) 180 MG 24 hr capsule Take 180 mg by mouth daily. 04/01/21   [provider]  doxepin (SINEQUAN) 75 MG capsule Take 75 mg by mouth at bedtime. 04/01/21   [provider]  DULoxetine (CYMBALTA) 60 MG capsule Take 1 capsule (60 mg total) by mouth at bedtime. 01/03/21   Fargo, Amy E, NP  HYDROcodone-acetaminophen (NORCO/VICODIN) 5-325 MG tablet Take 1 tablet by mouth 2 (two) times daily as needed for severe pain. 03/30/21   [provider]  hydroxychloroquine (PLAQUENIL) 200 MG tablet Take 1 tablet (200 mg total) by mouth daily. 01/03/21   Fargo, Amy E, NP  lactulose (CHRONULAC) 10 GM/15ML solution Take 15 mLs (10 g  total) by mouth daily as needed for mild constipation. 01/29/21   Zannie Cove, MD  methocarbamol (ROBAXIN) 500 MG tablet Take 1 tablet (500 mg total) by mouth every 8 (eight) hours as needed for muscle spasms. Patient not taking: No sig reported 01/29/21   Zannie Cove, MD  Multiple Vitamin (MULTIVITAMIN WITH MINERALS) TABS tablet Take 1 tablet by mouth daily. 06/03/19   Shahmehdi, Gemma Payor, MD  pantoprazole (PROTONIX) 40 MG tablet Take 1 tablet (40 mg total) by mouth daily. 01/03/21   Octavia Heir, NP    Allergies    Atorvastatin  Review of Systems   Review of Systems  Musculoskeletal:  Positive for back pain.  All other systems reviewed and are negative.  Physical Exam Updated Vital Signs BP (!) 156/122 (BP Location: Left Arm)   Pulse 83   Temp 98.6 F (37 C)   Resp 17   Ht 5\' 6"  (1.676 m)   Wt 92 kg   SpO2 98%   BMI 32.74 kg/m   Physical Exam Vitals and nursing note reviewed.  Constitutional:      Appearance: She is well-developed.   HENT:     Head: Normocephalic and atraumatic.  Cardiovascular:     Rate and Rhythm: Normal rate and regular rhythm.  Pulmonary:     Effort: Pulmonary effort is normal. No respiratory distress.  Abdominal:     Palpations: Abdomen is soft.     Tenderness: There is no abdominal tenderness. There is no guarding or rebound.  Musculoskeletal:     Comments: 2+ DP pulses bilaterally.  Mild ttp over bilateral hips  Skin:    General: Skin is warm and dry.  Neurological:     Mental Status: She is alert and oriented to person, place, and time.     Comments: 4+/5 strength in LLE.  5/5 strength in LLE.  5/5 strength in BUE.  Sensation to light touch intact in all four extremities.  Unable to illicit patellar reflexes.  No ankle clonus.  No saddle anesthesia  Psychiatric:        Behavior: Behavior normal.    ED Results / Procedures / Treatments   Labs (all labs ordered are listed, but only abnormal results are displayed) Labs Reviewed - No data to display  EKG None  Radiology No results found.  Procedures Procedures   Medications Ordered in ED Medications - No data to display  ED Course  I have reviewed the triage vital signs and the nursing notes.  Pertinent labs & imaging results that were available during my care of the patient were reviewed by me and considered in my medical decision making (see chart for details).    MDM Rules/Calculators/A&P                          patient here for evaluation of acute on chronic back pain now with sensation of urinary retention. She does have some mild left lower extremity weakness on examination but pain does limit her strength testing in this leg. Given her new symptoms plan to obtain MRI to further evaluate for spinal cord pathology. Patient care transferred pending labs and imaging.  Final Clinical Impression(s) / ED Diagnoses Final diagnoses:  None    Rx / DC Orders ED Discharge Orders     None        Tilden Fossa,  MD 06/18/21 [phone]

## 2021-06-18 NOTE — ED Notes (Signed)
Sleeping

## 2021-06-18 NOTE — Consult Note (Signed)
Neurosurgery Consultation  Reason for Consult: Low back and BLE pain Referring Physician: Stevie Kern  CC: Back / BLE pain  HPI: This is a 70 y.o. woman that presents with acute low back and BLE pain. She endorses some global BLE weakness but blames this on pain. She walks with a cane due to imbalance and pain, tends to flex at the lumbar spine when bearing weight. The pain is sharp in quality, severe in intensity, worse with walking / bearning weight, travels down the BLE from the buttock into the proximal legs when sitting, then down the distal legs when standing. No new focal weakness, numbness, or parasthesias, no recent change in bowel or bladder function. She has been in what sounds like pain management for a few years, but has an upcoming lumbar ESI scheduled. She states the symptoms have been severe for the past few days, but has had issues for years that are slowly progressive, aside from this acute episode.    ROS: A 14 point ROS was performed and is negative except as noted in the HPI.   PMHx:  Past Medical History:  Diagnosis Date   Acute on chronic kidney failure (HCC)    Anemia    Arthritis    Chest pain    Diverticulitis    Hypertension    Stroke [facility])    UTI (urinary tract infection) 10/2019   FamHx:  Family History  Problem Relation Age of Onset   Hypertension Mother    Diabetes Mother    CAD Father        died of MI at age 91   Hypertension Father    Diabetes Sister    Diabetes Sister    Kidney disease Neg Hx    Adrenal disorder Neg Hx    SocHx:  reports that she has never smoked. She has never used smokeless tobacco. She reports that she does not currently use alcohol. She reports that she does not use drugs.  Exam: Vital signs in last 24 hours: Temp:  [97.8 F (36.6 C)-98.6 F (37 C)] 98 F (36.7 C) (08/23 0833) Pulse Rate:  [72-92] 78 (08/23 1008) Resp:  [16-20] 20 (08/23 1008) BP: (137-194)/(88-122) 174/102 (08/23 1008) SpO2:  [94 %-98 %] 97 % (08/23  1008) Weight:  [92 kg] 92 kg (08/22 1922) General: Awake, alert, cooperative, sitting on the edge of the bed, appears uncomfortable Head: Normocephalic and atruamatic HEENT: Neck supple Pulmonary: breathing room air comfortably, no evidence of increased work of breathing Cardiac: RRR Abdomen: S NT ND Extremities: Warm and well perfused x4, some TTP over both knees but no recreation of pain w/ int/ext rotation of the hips, no TTP over the greater trochanters, +TTP over the caudal L-spine Neuro: AOx3, PERRL, EOMI, FS Strength 5/5 x4, SILTx4, no hoffman's, no clonus, reflexes 1+ diffusely   Assessment and Plan: 70 y.o. woman w/ acute on chronic low back and radicular pain. MRI L-spine personally reviewed, which shows L4-5 spondylolisthesis, L5 fracture (previously seen on multiple films), severe degenerative changes, worst in the right L4-5 facet, moderate canal and foraminal stenosis at L3-4, severe at L4-5.  -recommend pulse of glucocorticoids to try and bridge her to her [facility] appointment. Discussed w/ the pt to call me if they wear off too quickly and we can discuss surgery -please call with any concerns or questions  Jadene Pierini, MD 68/23/22 10:22 AM [facility]

## 2021-06-18 NOTE — ED Notes (Signed)
Patient transported to MRI 

## 2021-06-20 LAB — URINE CULTURE: Culture: >=100000 — AB

## 2021-06-21 ENCOUNTER — Telehealth: Payer: Self-pay | Admitting: Emergency Medicine

## 2021-06-21 NOTE — Telephone Encounter (Signed)
Post ED Visit - Positive Culture Follow-up  Culture report reviewed by antimicrobial stewardship pharmacist: [facility] []  , Pharm.D. []  Enzo Bi, .D., BCPS AQ-ID []  Celedonio Miyamoto, Pharm.D., BCPS []  [address], Pharm.D., BCPS []  [place], Garvin Fila.D., BCPS, AAHIVP []  , Pharm.D., BCPS, AAHIVP []  Georgina Pillion, PharmD, BCPS []  , PharmD, BCPS []  [place], PharmD, BCPS [x]  [address], PharmD []  , PharmD, BCPS []  Estella Husk, PharmD  Pharmacy Team []  Lysle Pearl, PharmD []  , PharmD []  Phillips Climes, PharmD []  , Rph []  Agapito Games) , PharmD []  Shirlee More, PharmD []  , PharmD []  Mervyn Gay, PharmD []  , PharmD []  Vinnie Level, PharmD []  [facility], PharmD []  , PharmD []  Len Childs, PharmD   Positive urine culture No further patient follow-up is required at this time. PA  Greer Pickerel RN 06/21/2021, 10:31 AM

## 2021-06-24 ENCOUNTER — Other Ambulatory Visit: Payer: Self-pay | Admitting: Orthopedic Surgery

## 2021-06-24 DIAGNOSIS — I639 Cerebral infarction, unspecified: Secondary | ICD-10-CM

## 2021-06-26 ENCOUNTER — Ambulatory Visit: Payer: Medicare Other | Admitting: Adult Health

## 2021-06-27 ENCOUNTER — Encounter (HOSPITAL_BASED_OUTPATIENT_CLINIC_OR_DEPARTMENT_OTHER): Payer: Self-pay | Admitting: Emergency Medicine

## 2021-06-27 ENCOUNTER — Emergency Department (HOSPITAL_BASED_OUTPATIENT_CLINIC_OR_DEPARTMENT_OTHER): Payer: Medicare Other

## 2021-06-27 ENCOUNTER — Other Ambulatory Visit: Payer: Self-pay

## 2021-06-27 ENCOUNTER — Emergency Department (HOSPITAL_BASED_OUTPATIENT_CLINIC_OR_DEPARTMENT_OTHER)
Admission: EM | Admit: 2021-06-27 | Discharge: 2021-06-27 | Disposition: A | Discharge status: Home / Self Care | Payer: Medicare Other | Attending: Emergency Medicine | Admitting: Emergency Medicine

## 2021-06-27 ENCOUNTER — Emergency Department (HOSPITAL_BASED_OUTPATIENT_CLINIC_OR_DEPARTMENT_OTHER): Payer: Medicare Other | Admitting: Radiology

## 2021-06-27 DIAGNOSIS — M545 Low back pain, unspecified: Secondary | ICD-10-CM | POA: Diagnosis not present

## 2021-06-27 DIAGNOSIS — R4182 Altered mental status, unspecified: Secondary | ICD-10-CM | POA: Diagnosis not present

## 2021-06-27 DIAGNOSIS — N183 Chronic kidney disease, stage 3 unspecified: Secondary | ICD-10-CM | POA: Insufficient documentation

## 2021-06-27 DIAGNOSIS — W19XXXA Unspecified fall, initial encounter: Secondary | ICD-10-CM | POA: Diagnosis not present

## 2021-06-27 DIAGNOSIS — R42 Dizziness and giddiness: Secondary | ICD-10-CM | POA: Insufficient documentation

## 2021-06-27 DIAGNOSIS — Z7902 Long term (current) use of antithrombotics/antiplatelets: Secondary | ICD-10-CM | POA: Diagnosis not present

## 2021-06-27 DIAGNOSIS — Z79899 Other long term (current) drug therapy: Secondary | ICD-10-CM | POA: Diagnosis not present

## 2021-06-27 DIAGNOSIS — I129 Hypertensive chronic kidney disease with stage 1 through stage 4 chronic kidney disease, or unspecified chronic kidney disease: Secondary | ICD-10-CM | POA: Diagnosis not present

## 2021-06-27 DIAGNOSIS — I4891 Unspecified atrial fibrillation: Secondary | ICD-10-CM | POA: Diagnosis not present

## 2021-06-27 LAB — URINALYSIS, ROUTINE W REFLEX MICROSCOPIC
Bilirubin Urine: NEGATIVE
Glucose, UA: NEGATIVE mg/dL
Hgb urine dipstick: NEGATIVE
Ketones, ur: NEGATIVE mg/dL
Nitrite: NEGATIVE
Specific Gravity, Urine: 1.014 (ref 1.005–1.030)
pH: 7 (ref 5.0–8.0)

## 2021-06-27 LAB — COMPREHENSIVE METABOLIC PANEL
ALT: 16 U/L (ref 0–44)
AST: 26 U/L (ref 15–41)
Albumin: 3.6 g/dL (ref 3.5–5.0)
Alkaline Phosphatase: 98 U/L (ref 38–126)
Anion gap: 10 (ref 5–15)
BUN: 27 mg/dL — ABNORMAL HIGH (ref 8–23)
CO2: 27 mmol/L (ref 22–32)
Calcium: 9.7 mg/dL (ref 8.9–10.3)
Chloride: 103 mmol/L (ref 98–111)
Creatinine, Ser: 1.78 mg/dL — ABNORMAL HIGH (ref 0.44–1.00)
GFR, Estimated: 31 mL/min — ABNORMAL LOW (ref >60)
Glucose, Bld: 119 mg/dL — ABNORMAL HIGH (ref 70–99)
Potassium: 4.4 mmol/L (ref 3.5–5.1)
Sodium: 140 mmol/L (ref 135–145)
Total Bilirubin: 0.5 mg/dL (ref 0.3–1.2)
Total Protein: 6.9 g/dL (ref 6.5–8.1)

## 2021-06-27 LAB — CBC
HCT: 34.2 % — ABNORMAL LOW (ref 36.0–46.0)
Hemoglobin: 11 g/dL — ABNORMAL LOW (ref 12.0–15.0)
MCH: 29 pg (ref 26.0–34.0)
MCHC: 32.2 g/dL (ref 30.0–36.0)
MCV: 90.2 fL (ref 80.0–100.0)
Platelets: 169 10*3/uL (ref 150–400)
RBC: 3.79 MIL/uL — ABNORMAL LOW (ref 3.87–5.11)
RDW: 14.5 % (ref 11.5–15.5)
WBC: 8 10*3/uL (ref 4.0–10.5)
nRBC: 0 % (ref 0.0–0.2)

## 2021-06-27 MED ORDER — ONDANSETRON HCL 4 MG/2ML IJ SOLN
4.0000 mg | Freq: Once | INTRAMUSCULAR | Status: AC
  Administered 2021-06-27: 4 mg via INTRAVENOUS
  Filled 2021-06-27: qty 2

## 2021-06-27 MED ORDER — SODIUM CHLORIDE 0.9 % IV SOLN: INTRAVENOUS | Status: DC

## 2021-06-27 MED ORDER — SODIUM CHLORIDE 0.9 % IV BOLUS
500.0000 mL | Freq: Once | INTRAVENOUS | Status: AC
  Administered 2021-06-27: 500 mL via INTRAVENOUS

## 2021-06-27 MED ORDER — HYDROMORPHONE HCL 1 MG/ML IJ SOLN
0.5000 mg | Freq: Once | INTRAMUSCULAR | Status: AC
Start: 2021-06-27 — End: 2021-06-27
  Administered 2021-06-27: 0.5 mg via INTRAVENOUS
  Filled 2021-06-27: qty 1

## 2021-06-27 NOTE — ED Notes (Signed)
Patient transported to CT 

## 2021-06-27 NOTE — Discharge Instructions (Addendum)
Work-up for the fall and concerns for urinary tract infection, dizziness, not eating and drinking well without any acute findings.  CT head negative CT low back negative for any acute injury.  Evidence of old injuries have been present in the past.  And x-rays of both hips and pelvis without any acute injury.  But has a lot of arthritic changes to the hip area.  Also no evidence of any significant dehydration.  No evidence of urinary tract infection continue the Keflex.  Follow-up with her doctors.

## 2021-06-27 NOTE — ED Notes (Signed)
Patient is resting comfortably, warm blankets given, call bell within reach. Urine specimen sent to lab.

## 2021-06-27 NOTE — ED Triage Notes (Signed)
Pt is undergoing antibiotic treatment for UTI (3rd day). Pt has not been eating or drinking well because she doesn't have an appetite. Pt is weak, has fallen twice in the past 12 hours, landing both times on her bottom. Pt denies hitting her head/ pt reports dizziness being the culprit for her falling. Pt has been dizzy the whole 3 days.

## 2021-06-27 NOTE — ED Notes (Signed)
Patient transported to X-ray 

## 2021-06-27 NOTE — ED Notes (Signed)
IV attempted without success. 

## 2021-06-27 NOTE — ED Provider Notes (Signed)
[facility] EMERGENCY DEPT Provider Note   CSN: [id number] Arrival date & time: 06/27/21  6579     History Chief Complaint  Patient presents with   Marletta Lor    Michelle Graves is a 70 y.o. female.  Patient brought in by EMS.  Patient used [employer] to make a call following a fall.  Patient was recently admitted to [facility] care in [place] from August 27 August 29 for altered mental status felt to be secondary to urinary tract infection.  They felt the altered mental status was secondary to urinary tract infection and it grew Klebsiella.  Apparently sensitive to Keflex she was discharged to transition to Keflex orally on the day of discharge for 7 days.  Patient states that she is taking it.  Patient is a DNR.  Patient known to have chronic kidney disease known to have some altered mental status.  And hypertension.  With the fall patient with a complaint of back pain.  Patient's had a history of several falls.  Stating that there are some dizziness.  Patient had apparently fallen twice in the past 12 hours.  Both times landing on her bottom.  Denies any hip or pelvic pain.  Denies hitting her head.  Patient also states she has not been eating or drinking well.      Past Medical History:  Diagnosis Date   Acute on chronic kidney failure (HCC)    Anemia    Arthritis    Chest pain    Diverticulitis    Hypertension    Stroke [facility])    UTI (urinary tract infection) 10/2019    Patient Active Problem List   Diagnosis Date Noted   Acute alteration in mental status    Uncontrolled hypertension    Altered mental status 03/19/2021   Multinodular goiter 02/25/2021   Acute metabolic encephalopathy 01/21/2021   Hypertensive urgency 01/21/2021   Sepsis secondary to UTI (HCC) 01/21/2021   Adrenal insufficiency (HCC) 12/18/2020   Encephalopathy 12/17/2020   TIA (transient ischemic attack) 12/15/2020   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   Bowel obstruction (HCC) 05/06/2020    SIRS (systemic inflammatory response syndrome) (HCC) 05/06/2020   UTI (urinary tract infection) 11/08/2019   Major depressive disorder, recurrent episode, moderate (HCC) 11/08/2019   Urinary retention 10/28/2019   Acute on chronic kidney failure (HCC) 10/27/2019   Malnutrition of moderate degree 10/03/2019   Colitis 09/30/2019   AKI (acute kidney injury) (HCC) 05/28/2019   Nausea & vomiting 05/28/2019   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   Anemia, chronic disease 05/28/2019   Swelling    Small vessel disease, cerebrovascular    Acute ischemic stroke (HCC)    Weakness    Acute left ankle pain    Acute on chronic renal failure (HCC) 05/13/2019   Weakness of both lower extremities 05/13/2019   Cerebral thrombosis with cerebral infarction 03/31/2019   New onset a-fib (HCC) 03/27/2019   Acute kidney injury superimposed on CKD (HCC) 03/24/2019   Hypotension 03/24/2019   Hematemesis 03/24/2019   Dark stools 03/24/2019   Diverticulitis of intestine without perforation or abscess without bleeding    Sinus tachycardia 08/27/2016   Diverticulitis 08/26/2016   HYPERTENSION, MALIGNANT ESSENTIAL 06/24/2007   CKD (chronic kidney disease), stage III (HCC) 06/24/2007   Rheumatoid arthritis (HCC) 06/24/2007    Past Surgical History:  Procedure Laterality Date   BIOPSY  04/01/2019   Procedure: BIOPSY;  Surgeon: Kathi Der, MD;  Location: [facility] ENDOSCOPY;  Service: Gastroenterology;;   BIOPSY  10/05/2019   Procedure: BIOPSY;  Surgeon: Kathi Der, MD;  Location: [facility]OS[facility];  Service: Gastroenterology;;   BIOPSY  05/10/2020   Procedure: BIOPSY;  Surgeon: Kathi Der, MD;  Location: [facility] ENDOSCOPY;  Service: Gastroenterology;;   BIOPSY  11/29/2020   Procedure: BIOPSY;  Surgeon: Charlott Rakes, MD;  Location: [facility] ENDOSCOPY;  Service: Gastroenterology;;   COLONOSCOPY N/A 11/29/2020   Procedure: COLONOSCOPY;  Surgeon: Charlott Rakes, MD;  Location: [facility] ENDOSCOPY;  Service: Gastroenterology;   Laterality: N/A;   COLONOSCOPY WITH PROPOFOL N/A 04/01/2019   Procedure: COLONOSCOPY WITH PROPOFOL;  Surgeon: Kathi Der, MD;  [facility]OS[facility];  Service: Gastroenterology;  Laterality: N/A;   COLONOSCOPY WITH PROPOFOL N/A 10/05/2019   Procedure: COLONOSCOPY WITH PROPOFOL;  Surgeon: Kathi Der, MD;  [facility];  Service: Gastroenterology;  Laterality: N/A;   COLONOSCOPY WITH PROPOFOL N/A 05/10/2020   Procedure: COLONOSCOPY WITH PROPOFOL;  Surgeon: Kathi Der, MD;  [facility]OS[facility];  Service: Gastroenterology;  Laterality: N/A;   ESOPHAGOGASTRODUODENOSCOPY (EGD) WITH PROPOFOL N/A 04/01/2019   Procedure: ESOPHAGOGASTRODUODENOSCOPY (EGD) WITH PROPOFOL;  Surgeon: Kathi Der, MD;  [facility];  Service: Gastroenterology;  Laterality: N/A;   ESOPHAGOGASTRODUODENOSCOPY (EGD) WITH PROPOFOL N/A 10/05/2019   Procedure: ESOPHAGOGASTRODUODENOSCOPY (EGD) WITH PROPOFOL;  Surgeon: Kathi Der, MD;  [facility];  Service: Gastroenterology;  Laterality: N/A;   POLYPECTOMY  11/29/2020   Procedure: POLYPECTOMY;  Surgeon: Charlott Rakes, MD;  Location: [facility] ENDOSCOPY;  Service: Gastroenterology;;   SUBMUCOSAL TATTOO INJECTION  11/29/2020   Procedure: SUBMUCOSAL TATTOO INJECTION;  Surgeon: Charlott Rakes, MD;  Location: [facility] ENDOSCOPY;  Service: Gastroenterology;;   TONSILLECTOMY       OB History   No obstetric history on file.     Family History  Problem Relation Age of Onset   Hypertension Mother    Diabetes Mother    CAD Father        died of MI at age 15   Hypertension Father    Diabetes Sister    Diabetes Sister    Kidney disease Neg Hx    Adrenal disorder Neg Hx     Social History   Tobacco Use   Smoking status: Never   Smokeless tobacco: Never  Vaping Use   Vaping Use: Never used  Substance Use Topics   Alcohol use: Not Currently    Alcohol/week: 0.0 standard drinks    Comment: sometimes    Drug use: No    Home  Medications Prior to Admission medications   Medication Sig Start Date End Date Taking? Authorizing Provider  carvedilol (COREG) 25 MG tablet Take 1 tablet (25 mg total) by mouth 2 (two) times daily. 01/03/21  Yes Fargo, Amy E, NP  clopidogrel (PLAVIX) 75 MG tablet Take 1 tablet (75 mg total) by mouth daily. 03/12/21  Yes Micki Riley, MD  diltiazem (CARDIZEM CD) 180 MG 24 hr capsule Take 180 mg by mouth daily. 04/01/21  Yes [provider]  doxepin (SINEQUAN) 75 MG capsule Take 75 mg by mouth at bedtime. 04/01/21  Yes [provider]  DULoxetine (CYMBALTA) 60 MG capsule Take 1 capsule (60 mg total) by mouth at bedtime. 01/03/21  Yes Fargo, Amy E, NP  hydroxychloroquine (PLAQUENIL) 200 MG tablet Take 1 tablet (200 mg total) by mouth daily. Patient taking differently: Take 200 mg by mouth 2 (two) times daily. 01/03/21  Yes Fargo, Amy E, NP  lactulose (CHRONULAC) 10 GM/15ML solution Take 15 mLs (10 g total) by mouth daily as needed for mild  constipation. 01/29/21  Yes Zannie Cove, MD  Multiple Vitamins-Minerals (CENTRUM WOMEN) TABS Take 1 tablet by mouth daily.   Yes [provider]  pantoprazole (PROTONIX) 40 MG tablet Take 1 tablet (40 mg total) by mouth daily. 01/03/21  Yes Fargo, Amy E, NP  rosuvastatin (CRESTOR) 10 MG tablet Take 10 mg by mouth daily.   Yes [provider]  atorvastatin (LIPITOR) 40 MG tablet TAKE 1 AND 1/2 TABLETS(60 MG) BY MOUTH DAILY Patient taking differently: Take 40 mg by mouth daily. 05/27/21   Micki Riley, MD  HYDROcodone-acetaminophen (NORCO/VICODIN) 5-325 MG tablet Take 1 tablet by mouth in the morning and at bedtime. 03/30/21   [provider]  leflunomide (ARAVA) 20 MG tablet Take 20 mg by mouth daily. 05/27/21   [provider]  methocarbamol (ROBAXIN) 500 MG tablet Take 1 tablet (500 mg total) by mouth every 8 (eight) hours as needed for muscle spasms. 01/29/21   Zannie Cove, MD  oxyCODONE-acetaminophen  (PERCOCET/ROXICET) 5-325 MG tablet Take 1 tablet by mouth 3 (three) times daily as needed for severe pain.    [provider]    Allergies    Atorvastatin  Review of Systems   Review of Systems  Constitutional:  Positive for appetite change. Negative for chills and fever.  HENT:  Negative for ear pain and sore throat.   Eyes:  Negative for pain and visual disturbance.  Respiratory:  Negative for cough and shortness of breath.   Cardiovascular:  Negative for chest pain and palpitations.  Gastrointestinal:  Negative for abdominal pain and vomiting.  Genitourinary:  Negative for dysuria and hematuria.  Musculoskeletal:  Positive for back pain. Negative for arthralgias.  Skin:  Negative for color change and rash.  Neurological:  Positive for dizziness. Negative for seizures, syncope, speech difficulty, weakness, numbness and headaches.  Psychiatric/Behavioral:  Positive for confusion.   All other systems reviewed and are negative.  Physical Exam Updated Vital Signs BP (!) 146/97   Pulse 96   Temp 97.9 F (36.6 C)   Resp 18   SpO2 94%   Physical Exam Vitals and nursing note reviewed.  Constitutional:      General: She is not in acute distress.    Appearance: Normal appearance. She is well-developed.  HENT:     Head: Normocephalic and atraumatic.     Mouth/Throat:     Mouth: Mucous membranes are dry.  Eyes:     Extraocular Movements: Extraocular movements intact.     Conjunctiva/sclera: Conjunctivae normal.     Pupils: Pupils are equal, round, and reactive to light.  Cardiovascular:     Rate and Rhythm: Normal rate and regular rhythm.     Heart sounds: No murmur heard. Pulmonary:     Effort: Pulmonary effort is normal. No respiratory distress.     Breath sounds: Normal breath sounds.  Chest:     Chest wall: No tenderness.  Abdominal:     Palpations: Abdomen is soft.     Tenderness: There is no abdominal tenderness.  Musculoskeletal:        General: Tenderness  present.     Cervical back: Normal range of motion and neck supple. No rigidity or tenderness.     Comments: Tenderness to lower lumbar back area.  Good range of motion of lower extremities.  With some discomfort.  In the hip area.  Skin:    General: Skin is warm and dry.     Capillary Refill: Capillary refill takes less than 2  seconds.  Neurological:     General: No focal deficit present.     Mental Status: She is alert.    ED Results / Procedures / Treatments   Labs (all labs ordered are listed, but only abnormal results are displayed) Labs Reviewed  CBC - Abnormal; Notable for the following components:      Result Value   RBC 3.79 (*)    Hemoglobin 11.0 (*)    HCT 34.2 (*)    All other components within normal limits  COMPREHENSIVE METABOLIC PANEL - Abnormal; Notable for the following components:   Glucose, Bld 119 (*)    BUN 27 (*)    Creatinine, Ser 1.78 (*)    GFR, Estimated 31 (*)    All other components within normal limits  URINALYSIS, ROUTINE W REFLEX MICROSCOPIC - Abnormal; Notable for the following components:   Protein, ur TRACE (*)    Leukocytes,Ua SMALL (*)    All other components within normal limits    EKG EKG Interpretation  Date/Time:  Thursday June 27 2021 10:26:05 EDT Ventricular Rate:  93 PR Interval:  166 QRS Duration: 96 QT Interval:  380 QTC Calculation: 473 R Axis:   -40 Text Interpretation: Sinus rhythm Left anterior fascicular block Consider anterior infarct No significant change since last tracing Confirmed by Vanetta Mulders [phone]) on 06/27/2021 10:40:11 AM  Radiology CT Head Wo Contrast  Result Date: 06/27/2021 CLINICAL DATA:  Head trauma. Weakness with 2 falls in the past 12 hours. EXAM: CT HEAD WITHOUT CONTRAST TECHNIQUE: Contiguous axial images were obtained from the base of the skull through the vertex without intravenous contrast. COMPARISON:  03/19/2021 FINDINGS: Brain: No evidence of acute infarction, hemorrhage, hydrocephalus,  extra-axial collection or mass lesion/mass effect. There is moderate diffuse low-attenuation within the subcortical and periventricular white matter compatible with chronic microvascular disease. Mild prominence of the sulci and ventricles compatible with brain atrophy. Vascular: No hyperdense vessel or unexpected calcification. Skull: Normal. Negative for fracture or focal lesion. Sinuses/Orbits: None Other: None IMPRESSION: 1. No acute intracranial abnormalities. 2. Chronic small vessel ischemic change and brain atrophy. Electronically Signed   By: Signa Kell M.D.   On: 06/27/2021 12:19   CT Lumbar Spine Wo Contrast  Result Date: 06/27/2021 CLINICAL DATA:  Lower back pain, trauma EXAM: CT LUMBAR SPINE WITHOUT CONTRAST TECHNIQUE: Multidetector CT imaging of the lumbar spine was performed without intravenous contrast administration. Multiplanar CT image reconstructions were also generated. COMPARISON:  MRI lumbar spine 06/18/2021. FINDINGS: Segmentation: 5 lumbar type vertebrae. Alignment: There is grade 1 anterolisthesis at L4-L5. Vertebrae: There is a chronic compression deformity of L5 with the proximally 40% height loss centrally. No aggressive osseous lesion. No new compression deformity. No evidence of acute fracture. Paraspinal and other soft tissues: Negative. Disc levels: Multilevel degenerative disc disease and bilateral facet arthropathy and grade 1 anterolisthesis at L4-5 resulting in varying degrees of spinal canal stenosis and neural foraminal narrowing bilaterally, best described on recent lumbar spine MRI on 06/18/2021 IMPRESSION: Unchanged compression deformity of L5 with approximately 40% height loss centrally. No evidence of new acute lumbar spine fracture. Unchanged multilevel degenerative disc disease and facet arthritis with grade 1 anterolisthesis at L4-L5, varying degrees of spinal canal stenosis and bilateral neural foraminal narrowing, best described on recent lumbar spine MRI.  Electronically Signed   By: Caprice Renshaw M.D.   On: 06/27/2021 12:06   DG Chest Port 1 View  Result Date: 06/27/2021 CLINICAL DATA:  Fall, dizziness EXAM: PORTABLE CHEST 1 VIEW  COMPARISON:  None. FINDINGS: Normal mediastinum and cardiac silhouette. Ectatic aorta. Normal pulmonary vasculature. No evidence of effusion, infiltrate, or pneumothorax. No acute bony abnormality. IMPRESSION: No acute cardiopulmonary process. Electronically Signed   By: Genevive Bi M.D.   On: 06/27/2021 12:30   DG Hips Bilat W or Wo Pelvis 3-4 Views  Result Date: 06/27/2021 CLINICAL DATA:  Fall, dizziness EXAM: DG HIP (WITH OR WITHOUT PELVIS) 3-4V BILAT COMPARISON:  Radiograph 06/18/2021 FINDINGS: There is no evidence of acute fracture. There is moderate to severe bilateral hip osteoarthritis. IMPRESSION: No evidence of acute fracture. Unchanged moderate-severe bilateral hip osteoarthritis. Electronically Signed   By: Caprice Renshaw M.D.   On: 06/27/2021 12:33    Procedures Procedures   Medications Ordered in ED Medications  0.9 %  sodium chloride infusion (has no administration in time range)  sodium chloride 0.9 % bolus 500 mL (500 mLs Intravenous New Bag/Given 06/27/21 1108)  ondansetron (ZOFRAN) injection 4 mg (4 mg Intravenous Given 06/27/21 1210)  HYDROmorphone (DILAUDID) injection 0.5 mg (0.5 mg Intravenous Given 06/27/21 1210)    ED Course  I have reviewed the triage vital signs and the nursing notes.  Pertinent labs & imaging results that were available during my care of the patient were reviewed by me and considered in my medical decision making (see chart for details).    MDM Rules/Calculators/A&P                          Work-up here today shows no evidence of urinary tract infection.  Still has some of chronic kidney injury but not significantly changed from baseline for her.  Related to the fall CT head CT low back without any acute injuries.  Evidence of old injuries.  And x-ray of pelvis and both hips  without any bony injury.  But does have evidence of significant degenerative disease.  Patient does have pain medicine to take at home.  Patient stable for discharge home.  Final Clinical Impression(s) / ED Diagnoses Final diagnoses:  Fall, initial encounter  Acute midline low back pain without sciatica    Rx / DC Orders ED Discharge Orders     None        Vanetta Mulders, MD 06/27/21 1440

## 2021-06-27 NOTE — ED Notes (Signed)
pts daughter called and to transport patient home. She stated she had a meeting til 230pm.

## 2021-08-20 ENCOUNTER — Encounter (HOSPITAL_BASED_OUTPATIENT_CLINIC_OR_DEPARTMENT_OTHER): Payer: Self-pay

## 2021-08-20 ENCOUNTER — Other Ambulatory Visit: Payer: Self-pay

## 2021-08-20 ENCOUNTER — Emergency Department (HOSPITAL_BASED_OUTPATIENT_CLINIC_OR_DEPARTMENT_OTHER): Payer: Medicare Other | Admitting: Radiology

## 2021-08-20 DIAGNOSIS — Z79899 Other long term (current) drug therapy: Secondary | ICD-10-CM | POA: Diagnosis not present

## 2021-08-20 DIAGNOSIS — I129 Hypertensive chronic kidney disease with stage 1 through stage 4 chronic kidney disease, or unspecified chronic kidney disease: Secondary | ICD-10-CM | POA: Diagnosis not present

## 2021-08-20 DIAGNOSIS — N189 Chronic kidney disease, unspecified: Secondary | ICD-10-CM | POA: Insufficient documentation

## 2021-08-20 DIAGNOSIS — W010XXA Fall on same level from slipping, tripping and stumbling without subsequent striking against object, initial encounter: Secondary | ICD-10-CM | POA: Insufficient documentation

## 2021-08-20 DIAGNOSIS — M25561 Pain in right knee: Secondary | ICD-10-CM | POA: Diagnosis not present

## 2021-08-20 DIAGNOSIS — S83421A Sprain of lateral collateral ligament of right knee, initial encounter: Secondary | ICD-10-CM | POA: Diagnosis not present

## 2021-08-20 DIAGNOSIS — S8991XA Unspecified injury of right lower leg, initial encounter: Secondary | ICD-10-CM | POA: Diagnosis present

## 2021-08-20 NOTE — ED Triage Notes (Signed)
Brought by ems from home.  Reports she tripped and fell around 2pm.  Now having pain in right knee.  Reports she heard a pop.  Using ice and tylenol at home with no relief.

## 2021-08-21 ENCOUNTER — Emergency Department (HOSPITAL_BASED_OUTPATIENT_CLINIC_OR_DEPARTMENT_OTHER)
Admission: EM | Admit: 2021-08-21 | Discharge: 2021-08-21 | Disposition: A | Discharge status: Home / Self Care | Payer: Medicare Other | Attending: Emergency Medicine | Admitting: Emergency Medicine

## 2021-08-21 DIAGNOSIS — S83421A Sprain of lateral collateral ligament of right knee, initial encounter: Secondary | ICD-10-CM

## 2021-08-21 MED ORDER — HYDROCODONE-ACETAMINOPHEN 5-325 MG PO TABS: 1.0000 | ORAL_TABLET | Freq: Four times a day (QID) | ORAL | 0 refills | Status: DC | PRN

## 2021-08-21 MED ORDER — HYDROCODONE-ACETAMINOPHEN 5-325 MG PO TABS
1.0000 | ORAL_TABLET | Freq: Once | ORAL | Status: AC
  Administered 2021-08-21: 1 via ORAL
  Filled 2021-08-21: qty 1

## 2021-08-21 NOTE — ED Provider Notes (Signed)
[facility] EMERGENC[facility] Provider Note: Lowella Dell, MD, FACEP  CSN: [id number] MRN: [id number] ARRIVAL: 08/20/21 at 1805 ROOM: DB007/DB007   CHIEF COMPLAINT  Knee Injury   HISTORY OF PRESENT ILLNESS  08/21/21 1:58 AM Michelle Graves is a 70 y.o. female who tripped and fell yesterday about 2 PM.  She felt a pop in her right knee.  She is now having pain in her right knee which she rates as a 9 out of 10.  It is dull and aching and worse with movement or ambulation.  She has been applying ice and taking Tylenol without relief.  She is not able to bear weight on her right lower extremity.   Past Medical History:  Diagnosis Date   Acute on chronic kidney failure (HCC)    Anemia    Arthritis    Chest pain    Diverticulitis    Hypertension    Stroke [facility])    UTI (urinary tract infection) 10/2019    Past Surgical History:  Procedure Laterality Date   BIOPSY  04/01/2019   Procedure: BIOPSY;  Surgeon: Kathi Der, MD;  [facility]: [facility] ENDOS[facility];  Service: Gastroenterology;;   BIOPSY  10/05/2019   Procedure: BIOPSY;  Surgeon: Kathi Der, MD;  [facility];  Service: Gastroenterology;;   BIOPSY  05/10/2020   Procedure: BIOPSY;  Surgeon: Kathi Der, MD;  [facility];  Service: Gastroenterology;;   BIOPSY  11/29/2020   Procedure: BIOPSY;  Surgeon: Charlott Rakes, MD;  Location: [facility] ENDOSCOPY;  Service: Gastroenterology;;   COLONOSCOPY N/A 11/29/2020   Procedure: COLONOSCOPY;  Surgeon: Charlott Rakes, MD;  Location: [facility] ENDOSCOPY;  Service: Gastroenterology;  Laterality: N/A;   COLONOSCOPY WITH PROPOFOL N/A 04/01/2019   Procedure: COLONOSCOPY WITH PROPOFOL;  Surgeon: Kathi Der, MD;  [facility];  Service: Gastroenterology;  Laterality: N/A;   COLONOSCOPY WITH PROPOFOL N/A 10/05/2019   Procedure: COLONOSCOPY WITH PROPOFOL;  Surgeon: Kathi Der, MD;  [facility];  Service: Gastroenterology;  Laterality: N/A;    COLONOSCOPY WITH PROPOFOL N/A 05/10/2020   Procedure: COLONOSCOPY WITH PROPOFOL;  Surgeon: Kathi Der, MD;  [facility];  Service: Gastroenterology;  Laterality: N/A;   ESOPHAGOGASTRODUODENOSCOPY (EGD) WITH PROPOFOL N/A 04/01/2019   Procedure: ESOPHAGOGASTRODUODENOSCOPY (EGD) WITH PROPOFOL;  Surgeon: Kathi Der, MD;  [facility];  Service: Gastroenterology;  Laterality: N/A;   ESOPHAGOGASTRODUODENOSCOPY (EGD) WITH PROPOFOL N/A 10/05/2019   Procedure: ESOPHAGOGASTRODUODENOSCOPY (EGD) WITH PROPOFOL;  Surgeon: Kathi Der, MD;  [facility];  Service: Gastroenterology;  Laterality: N/A;   POLYPECTOMY  11/29/2020   Procedure: POLYPECTOMY;  Surgeon: Charlott Rakes, MD;  Location: [facility] ENDOSCOPY;  Service: Gastroenterology;;   SUBMUCOSAL TATTOO INJECTION  11/29/2020   Procedure: SUBMUCOSAL TATTOO INJECTION;  Surgeon: Charlott Rakes, MD;  Location: [facility] ENDOSCOPY;  Service: Gastroenterology;;   TONSILLECTOMY      Family History  Problem Relation Age of Onset   Hypertension Mother    Diabetes Mother    CAD Father        died of MI at age 71   Hypertension Father    Diabetes Sister    Diabetes Sister    Kidney disease Neg Hx    Adrenal disorder Neg Hx     Social History   Tobacco Use   Smoking status: Never    Passive exposure: Never   Smokeless tobacco: Never  Vaping Use   Vaping Use: Never used  Substance Use Topics   Alcohol use: Not Currently    Alcohol/week: 0.0 standard drinks  Comment: sometimes    Drug use: No    Prior to Admission medications   Medication Sig Start Date End Date Taking? Authorizing Provider  atorvastatin (LIPITOR) 40 MG tablet TAKE 1 AND 1/2 TABLETS(60 MG) BY MOUTH DAILY Patient taking differently: Take 40 mg by mouth daily. 05/27/21   Micki Riley, MD  carvedilol (COREG) 25 MG tablet Take 1 tablet (25 mg total) by mouth 2 (two) times daily. 01/03/21   Fargo, Amy E, NP  clopidogrel (PLAVIX) 75 MG tablet  Take 1 tablet (75 mg total) by mouth daily. 03/12/21   Micki Riley, MD  diltiazem (CARDIZEM CD) 180 MG 24 hr capsule Take 180 mg by mouth daily. 04/01/21   [provider]  doxepin (SINEQUAN) 75 MG capsule Take 75 mg by mouth at bedtime. 04/01/21   [provider]  DULoxetine (CYMBALTA) 60 MG capsule Take 1 capsule (60 mg total) by mouth at bedtime. 01/03/21   Fargo, Amy E, NP  HYDROcodone-acetaminophen (NORCO/VICODIN) 5-325 MG tablet Take 1 tablet by mouth every 6 (six) hours as needed (for pain). 08/21/21   Toula Miyasaki, MD  hydroxychloroquine (PLAQUENIL) 200 MG tablet Take 1 tablet (200 mg total) by mouth daily. Patient taking differently: Take 200 mg by mouth 2 (two) times daily. 01/03/21   Fargo, Amy E, NP  lactulose (CHRONULAC) 10 GM/15ML solution Take 15 mLs (10 g total) by mouth daily as needed for mild constipation. 01/29/21   Zannie Cove, MD  leflunomide (ARAVA) 20 MG tablet Take 20 mg by mouth daily. 05/27/21   [provider]  Multiple Vitamins-Minerals (CENTRUM WOMEN) TABS Take 1 tablet by mouth daily.    [provider]  pantoprazole (PROTONIX) 40 MG tablet Take 1 tablet (40 mg total) by mouth daily. 01/03/21   Fargo, Amy E, NP  rosuvastatin (CRESTOR) 10 MG tablet Take 10 mg by mouth daily.    [provider]    Allergies Atorvastatin   REVIEW OF SYSTEMS  Negative except as noted here or in the History of Present Illness.   PHYSICAL EXAMINATION  Initial Vital Signs Blood pressure (!) 180/102, pulse 67, temperature 98.3 F (36.8 C), temperature source Oral, resp. rate 14, height 5\' 7"  (1.702 m), weight 87.1 kg, SpO2 100 %.  Examination General: Well-developed, well-nourished female in no acute distress; appearance consistent with age of record HENT: normocephalic; atraumatic Eyes: Normal appearance Neck: supple Heart: regular rate and rhythm Lungs: clear to auscultation bilaterally Abdomen: soft; nondistended; nontender; bowel  sounds present Extremities: No deformity; tenderness over right lateral collateral ligament without gross instability; pulses normal Neurologic: Awake, alert and oriented; motor function intact in all extremities and symmetric; no facial droop Skin: Warm and dry Psychiatric: Normal mood and affect   RESULTS  Summary of this visit's results, reviewed and interpreted by myself:   EKG Interpretation  Date/Time:    Ventricular Rate:    PR Interval:    QRS Duration:   QT Interval:    QTC Calculation:   R Axis:     Text Interpretation:         Laboratory Studies: No results found for this or any previous visit (from the past 24 hour(s)). Imaging Studies: DG Knee Complete 4 Views Right  Result Date: 08/20/2021 CLINICAL DATA:  Injury EXAM: RIGHT KNEE - COMPLETE 4+ VIEW COMPARISON:  None. FINDINGS: Alignment is anatomic. No acute fracture. Mild changes of osteoarthritis without substantial joint space narrowing. No joint effusion. IMPRESSION: No acute fracture. Electronically Signed  By: Guadlupe Spanish M.D.   On: 08/20/2021 19:49    ED COURSE and MDM  Nursing notes, initial and subsequent vitals signs, including pulse oximetry, reviewed and interpreted by myself.  Vitals:   08/20/21 1901 08/20/21 1902 08/21/21 0145 08/21/21 0147  BP: (!) 154/109  (!) 180/102 (!) 180/102  Pulse: 72  67 67  Resp: 18  18 14   Temp: 98.3 F (36.8 C)     TempSrc: Oral     SpO2: 97%  100% 100%  Weight:  87.1 kg    Height:  5\' 7"  (1.702 m)     Medications  HYDROcodone-acetaminophen (NORCO/VICODIN) 5-325 MG per tablet 1 tablet (has no administration in time range)    Examination consistent with sprain of the right lateral collateral ligament.  We will place her in a knee immobilizer and crutches and refer to orthopedics.  PROCEDURES  Procedures   ED DIAGNOSES     ICD-10-CM   1. Sprain of lateral collateral ligament of right knee, initial encounter           ,  MD 08/21/21 [phone]

## 2021-08-28 ENCOUNTER — Other Ambulatory Visit: Payer: Self-pay

## 2021-08-28 ENCOUNTER — Encounter (HOSPITAL_BASED_OUTPATIENT_CLINIC_OR_DEPARTMENT_OTHER): Payer: Self-pay | Admitting: Emergency Medicine

## 2021-08-28 ENCOUNTER — Emergency Department (HOSPITAL_BASED_OUTPATIENT_CLINIC_OR_DEPARTMENT_OTHER)
Admission: EM | Admit: 2021-08-28 | Discharge: 2021-08-28 | Disposition: A | Discharge status: Home / Self Care | Payer: Medicare Other | Attending: Emergency Medicine | Admitting: Emergency Medicine

## 2021-08-28 DIAGNOSIS — N183 Chronic kidney disease, stage 3 unspecified: Secondary | ICD-10-CM | POA: Insufficient documentation

## 2021-08-28 DIAGNOSIS — W1839XA Other fall on same level, initial encounter: Secondary | ICD-10-CM | POA: Insufficient documentation

## 2021-08-28 DIAGNOSIS — Z7902 Long term (current) use of antithrombotics/antiplatelets: Secondary | ICD-10-CM | POA: Insufficient documentation

## 2021-08-28 DIAGNOSIS — I129 Hypertensive chronic kidney disease with stage 1 through stage 4 chronic kidney disease, or unspecified chronic kidney disease: Secondary | ICD-10-CM | POA: Insufficient documentation

## 2021-08-28 DIAGNOSIS — Z79899 Other long term (current) drug therapy: Secondary | ICD-10-CM | POA: Diagnosis not present

## 2021-08-28 DIAGNOSIS — M25561 Pain in right knee: Secondary | ICD-10-CM | POA: Diagnosis present

## 2021-08-28 MED ORDER — HYDROCODONE-ACETAMINOPHEN 5-325 MG PO TABS
2.0000 | ORAL_TABLET | Freq: Once | ORAL | Status: AC
  Administered 2021-08-28: 2 via ORAL
  Filled 2021-08-28: qty 2

## 2021-08-28 MED ORDER — HYDROCODONE-ACETAMINOPHEN 10-325 MG PO TABS
1.0000 | ORAL_TABLET | Freq: Once | ORAL | Status: DC
  Filled 2021-08-28: qty 1

## 2021-08-28 NOTE — ED Notes (Signed)
Pt stated that she already has this knee brace at home and did not want  the current brace offered today.

## 2021-08-28 NOTE — Discharge Instructions (Addendum)
The number to Dr. Jena Gauss is attached to your chart, please schedule an appointment for further management of the pain to your right knee.  You may continue your pain medication while at home.  Provided with a knee sleeve today, which may be easier to get around and to help with pain control.  In addition, you may apply ice or heat to the area to help with pain control.

## 2021-08-28 NOTE — ED Notes (Signed)
Pt. Tells [place] that she "can't do anything". Her daughter and sister are also notified per phone and they also insist that pt. Is "unable to care for herself or get up at all and cannot even eat, drink or take her medicines". P.A. Johanna notified.

## 2021-08-28 NOTE — ED Triage Notes (Signed)
Pt arrived via  [facility]. Pt fell approx one week ago and tore ligaments in her right knee. Pt has had increasing pain since the fall and she has not been able to bare weight on same. Physical therapy has been doing home visits for same. Pt has been taking oxycodone for pain which she. states is ineffective.

## 2021-08-28 NOTE — ED Provider Notes (Addendum)
[facility] EMERGENCY DEPT Provider Note   CSN: [id number] Arrival date & time: 08/28/21  1315     History Chief Complaint  Patient presents with   Knee Pain    Michelle Graves is a 70 y.o. female.  70 year old female with a past medical history of CKD, hypertension, stroke presents to the ED with a chief complaint of right knee pain status post fall 1 week ago. Patient reports he was evaluated in the ED approximately 1 week ago, was diagnosed with a rupture of lateral collateral ligament, she reports she has not seek any orthopedist follow-up.  She does receive physical therapy visits at home, however she reports her pain has been uncontrolled.  She was placed in a knee immobilizer per patient, did not arrive with this in the ED.  She was prescribed oxycodone to help with pain but there has not been any improvement.  Pain is exacerbated with lying flat but somewhat relieved with movement.  The history is provided by the patient and medical records.  Knee Pain Associated symptoms: no back pain and no fever       Past Medical History:  Diagnosis Date   Acute on chronic kidney failure (HCC)    Anemia    Arthritis    Chest pain    Diverticulitis    Hypertension    Stroke [facility])    UTI (urinary tract infection) 10/2019    Patient Active Problem List   Diagnosis Date Noted   Acute alteration in mental status    Uncontrolled hypertension    Altered mental status 03/19/2021   Multinodular goiter [id number]   Acute metabolic encephalopathy [id number]   Hypertensive urgency 01/21/2021   Sepsis secondary to UTI ([place]) 01/21/2021   Adrenal insufficiency ([place]) 12/18/2020   Encephalopathy 12/17/2020   TIA (transient ischemic attack) 12/15/2020   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   Bowel obstruction ([place]) 05/06/2020   SIRS (systemic inflammatory response syndrome) ([place]) 05/06/2020   UTI (urinary tract infection) 11/08/2019   Major depressive disorder, recurrent  episode, moderate ([place]) 11/08/2019   Urinary retention 10/28/2019   Acute on chronic kidney failure ([place]) 10/27/2019   Malnutrition of moderate degree 10/03/2019   Colitis 09/30/2019   AKI (acute kidney injury) ([place]) 05/28/2019   Nausea & vomiting 05/28/2019   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   Anemia, chronic disease 05/28/2019   Swelling    Small vessel disease, cerebrovascular    Acute ischemic stroke ([place])    Weakness    Acute left ankle pain    Acute on chronic renal failure ([place]) 05/13/2019   Weakness of both lower extremities 05/13/2019   Cerebral thrombosis with cerebral infarction 03/31/2019   New onset a-fib ([place]) 03/27/2019   Acute kidney injury superimposed on CKD ([place]) 03/24/2019   Hypotension 03/24/2019   Hematemesis 03/24/2019   Dark stools 03/24/2019   Diverticulitis of intestine without perforation or abscess without bleeding    Sinus tachycardia 08/27/2016   Diverticulitis 08/26/2016   HYPERTENSION, MALIGNANT ESSENTIAL 06/24/2007   CKD (chronic kidney disease), stage III ([place]) 06/24/2007   Rheumatoid arthritis ([place]) 06/24/2007    Past Surgical History:  Procedure Laterality Date   BIOPSY  04/01/2019   Procedure: BIOPSY;  Surgeon: Otis Brace, MD;  Location: [place];  Service: Gastroenterology;;   BIOPSY  10/05/2019   Procedure: BIOPSY;  Surgeon: Otis Brace, MD;  Location: [place];  Service: Gastroenterology;;   BIOPSY  05/10/2020   Procedure: BIOPSY;  Surgeon: Otis Brace, MD;  Location: [facility]  ENDOSCOPY;  Service: Gastroenterology;;   BIOPSY  11/29/2020   Procedure: BIOPSY;  Surgeon: Wilford Corner, MD;  Location: [place];  Service: Gastroenterology;;   COLONOSCOPY N/A 11/29/2020   Procedure: COLONOSCOPY;  Surgeon: Wilford Corner, MD;  Location: [place];  Service: Gastroenterology;  Laterality: N/A;   COLONOSCOPY WITH PROPOFOL N/A 04/01/2019   Procedure: COLONOSCOPY WITH PROPOFOL;  Surgeon: Otis Brace, MD;  Location: [place];  Service: Gastroenterology;  Laterality: N/A;   COLONOSCOPY WITH PROPOFOL N/A 10/05/2019   Procedure: COLONOSCOPY WITH PROPOFOL;  Surgeon: Otis Brace, MD;  Location: [place];  Service: Gastroenterology;  Laterality: N/A;   COLONOSCOPY WITH PROPOFOL N/A 05/10/2020   Procedure: COLONOSCOPY WITH PROPOFOL;  Surgeon: Otis Brace, MD;  Location: [place];  Service: Gastroenterology;  Laterality: N/A;   ESOPHAGOGASTRODUODENOSCOPY (EGD) WITH PROPOFOL N/A 04/01/2019   Procedure: ESOPHAGOGASTRODUODENOSCOPY (EGD) WITH PROPOFOL;  Surgeon: Otis Brace, MD;  Location: [place];  Service: Gastroenterology;  Laterality: N/A;   ESOPHAGOGASTRODUODENOSCOPY (EGD) WITH PROPOFOL N/A 10/05/2019   Procedure: ESOPHAGOGASTRODUODENOSCOPY (EGD) WITH PROPOFOL;  Surgeon: Otis Brace, MD;  Location: [place];  Service: Gastroenterology;  Laterality: N/A;   POLYPECTOMY  11/29/2020   Procedure: POLYPECTOMY;  Surgeon: Wilford Corner, MD;  Location: [facility] ENDOSCOPY;  Service: Gastroenterology;;   SUBMUCOSAL TATTOO INJECTION  11/29/2020   Procedure: SUBMUCOSAL TATTOO INJECTION;  Surgeon: Wilford Corner, MD;  Location: [facility] ENDOSCOPY;  Service: Gastroenterology;;   TONSILLECTOMY       OB History   No obstetric history on file.     Family History  Problem Relation Age of Onset   Hypertension Mother    Diabetes Mother    CAD Father        died of MI at age 34   Hypertension Father    Diabetes Sister    Diabetes Sister    Kidney disease Neg Hx    Adrenal disorder Neg Hx     Social History   Tobacco Use   Smoking status: Never    Passive exposure: Never   Smokeless tobacco: Never  Vaping Use   Vaping Use: Never used  Substance Use Topics   Alcohol use: Not Currently    Alcohol/week: 0.0 standard drinks    Comment: sometimes    Drug use: No    Home Medications Prior to Admission medications   Medication Sig Start Date End Date Taking? Authorizing Provider   atorvastatin (LIPITOR) 40 MG tablet TAKE 1 AND 1/2 TABLETS(60 MG) BY MOUTH DAILY Patient taking differently: Take 40 mg by mouth daily. 05/27/21  Yes Garvin Fila, MD  carvedilol (COREG) 25 MG tablet Take 1 tablet (25 mg total) by mouth 2 (two) times daily. 01/03/21  Yes Fargo, Amy E, NP  clopidogrel (PLAVIX) 75 MG tablet Take 1 tablet (75 mg total) by mouth daily. 03/12/21  Yes Garvin Fila, MD  diltiazem (CARDIZEM CD) 180 MG 24 hr capsule Take 180 mg by mouth daily. 04/01/21  Yes [provider]  doxepin (SINEQUAN) 75 MG capsule Take 75 mg by mouth at bedtime. 04/01/21  Yes [provider]  DULoxetine (CYMBALTA) 60 MG capsule Take 1 capsule (60 mg total) by mouth at bedtime. 01/03/21  Yes Fargo, Amy E, NP  HYDROcodone-acetaminophen (NORCO/VICODIN) 5-325 MG tablet Take 1 tablet by mouth every 6 (six) hours as needed (for pain). 08/21/21  Yes Molpus, John, MD  hydroxychloroquine (PLAQUENIL) 200 MG tablet Take 1 tablet (200 mg total) by mouth daily. Patient taking differently: Take 200 mg by mouth 2 (two)  times daily. 01/03/21  Yes Fargo, Amy E, NP  lactulose (CHRONULAC) 10 GM/15ML solution Take 15 mLs (10 g total) by mouth daily as needed for mild constipation. 01/29/21  Yes Zannie Cove, MD  leflunomide (ARAVA) 20 MG tablet Take 20 mg by mouth daily. 05/27/21  Yes [provider]  Multiple Vitamins-Minerals (CENTRUM WOMEN) TABS Take 1 tablet by mouth daily.   Yes [provider]  pantoprazole (PROTONIX) 40 MG tablet Take 1 tablet (40 mg total) by mouth daily. 01/03/21  Yes Fargo, Amy E, NP  rosuvastatin (CRESTOR) 10 MG tablet Take 10 mg by mouth daily.   Yes [provider]    Allergies    Atorvastatin  Review of Systems   Review of Systems  Constitutional:  Negative for chills and fever.  Respiratory:  Negative for shortness of breath.   Cardiovascular:  Negative for chest pain.  Gastrointestinal:  Negative for abdominal pain.  Genitourinary:   Negative for flank pain.  Musculoskeletal:  Positive for arthralgias. Negative for back pain.  All other systems reviewed and are negative.  Physical Exam Updated Vital Signs BP (!) 158/100   Pulse 85   Temp 98.4 F (36.9 C) (Oral)   Resp 16   Ht 5\' 7"  (1.702 m)   Wt 87.1 kg   SpO2 95%   BMI 30.07 kg/m   Physical Exam Vitals and nursing note reviewed.  Constitutional:      Appearance: Normal appearance.  HENT:     Head: Normocephalic and atraumatic.     Nose: Nose normal.  Cardiovascular:     Rate and Rhythm: Normal rate.     Pulses:          Posterior tibial pulses are 2+ on the right side and 2+ on the left side.  Abdominal:     General: Abdomen is flat.  Musculoskeletal:        General: Swelling present.     Cervical back: Normal range of motion and neck supple.     Comments: Swelling noted to the right knee, pain with palpation on the lateral aspect of her knee.  No bruising noted, no palpable effusion.  Skin:    General: Skin is warm and dry.  Neurological:     Mental Status: She is alert and oriented to person, place, and time.    ED Results / Procedures / Treatments   Labs (all labs ordered are listed, but only abnormal results are displayed) Labs Reviewed - No data to display  EKG None  Radiology No results found.  Procedures Procedures   Medications Ordered in ED Medications  HYDROcodone-acetaminophen (NORCO/VICODIN) 5-325 MG per tablet 2 tablet (2 tablets Oral Given 08/28/21 1445)    ED Course  I have reviewed the triage vital signs and the nursing notes.  Pertinent labs & imaging results that were available during my care of the patient were reviewed by me and considered in my medical decision making (see chart for details).    MDM Rules/Calculators/A&P                    Patient presents to the ED with a chief complaint of right knee pain which has been ongoing for the past week.  Diagnosed with a rupture of her lateral collateral  ligament, has not had any Ortho follow-up.  On today's visit she reports there has been no improvement in her symptoms.  She has not schedule any outpatient follow-up.  She is currently getting PT, pain  is uncontrolled with hydrocodone fives at home, I did discuss with her that she will ultimately need to see a specialist at this time.  However there is no new palpable effusion on my exam, last x-ray appears to be stable.  I did discuss with her we can try a knee sleeve, along with pain control here, but will ultimately need specialist follow-up.  She does have a history of CKD, therefore unable to provide her with any NSAIDs.  Patient is upset that her pain has not improved since she had the injury, I did discuss with her that she likely needs further specialist work-up.  She likely will need an MRI, however we are unable to perform that at this time in a nonemergent basis.  She is aware she will need to continue her pain medication at home along with follow-up with specialist.  She is otherwise stable for discharge at this time  5:26 PM Spoke to daughter Herbert Spires, who is aware patient does not meet criteria for inpatient hospitalization at this time. She does not have an acute injury and will need to find long term placement. I have also reviewed her chart extensively and see my notes were the same conversation was had a couple of months back where patient spent time in our ED waiting for placement. I discussed with daughter to call PCP in order to obtain referral for Rehab facility. Daughter reports patient cannot care for herself, however she is hemodynamically stable and without any further injury there is no availability of beds at this time.I also recommended [facility] if outpatient placement fails as this was how she was placed in the past. Of note, during her discharge patient receives PT and homehealth but daughter reports this is not enough as she works 9-5 (daughter). Unfortunately, patient will need to be  disposition home via [facility]. She is stable for discharge.     Portions of this note were generated with Lobbyist. Dictation errors may occur despite best attempts at proofreading.  Final Clinical Impression(s) / ED Diagnoses Final diagnoses:  Acute pain of right knee    Rx / DC Orders ED Discharge Orders     None        Janeece Fitting, PA-C 08/28/21 1500    Truddie Hidden, MD 08/28/21 1502    Janeece Fitting, PA-C 08/28/21 1747    Truddie Hidden, MD 08/29/21 [phone]

## 2021-10-07 ENCOUNTER — Ambulatory Visit (INDEPENDENT_AMBULATORY_CARE_PROVIDER_SITE_OTHER): Payer: Medicare Other | Admitting: Adult Health

## 2021-10-07 ENCOUNTER — Encounter: Payer: Self-pay | Admitting: Adult Health

## 2021-10-07 VITALS — BP 136/82 | HR 74

## 2021-10-07 DIAGNOSIS — I639 Cerebral infarction, unspecified: Secondary | ICD-10-CM

## 2021-10-07 DIAGNOSIS — Z8673 Personal history of transient ischemic attack (TIA), and cerebral infarction without residual deficits: Secondary | ICD-10-CM | POA: Diagnosis not present

## 2021-10-07 DIAGNOSIS — G3184 Mild cognitive impairment, so stated: Secondary | ICD-10-CM | POA: Diagnosis not present

## 2021-10-07 NOTE — Progress Notes (Signed)
[facility] [address] [place]. [place] [address] [phone]       STROKE FOLLOW UP NOTE  [name] Date of Birth:  06/24/1951 Medical Record Number:  [id number]    Primary neurologist: Dr. Leonie Man Reason for Referral: Stroke  Chief Complaint  Patient presents with   Follow-up    Rm 3 with daughter here for 3 month f/u- Reports she has been doing ok. She fell back in OCT and tore her right knee meniscus. Feels like memory has declined since stroke.       HPI:   Update 10/07/2021 JM: Returns for stroke follow-up after prior visit 7 months ago with Dr. Leonie Man.  She is accompanied by her daughter, Michelle Graves.  Overall stable from stroke standpoint -denies new or reoccurring stroke/TIA symptoms Per daughter, gradual decline of memory since stroke. She will frequently repeat herself and ask same questions. Able to maintain ADLs independently. Lives alone. Daughter helps with cooking, cleaning, and bill paying. Daughter sets up pill box for medications.  Compliant on Plavix  -denies side effects Currently on Crestor - difficulty tolerating atorvastatin. 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 Admission 5/24 for acute encephalopathy likely secondary to hypertensive urgency. Admit 6/20 for generalized weakness and worsening memory. Admit 8/27 for AMS in setting of UTI and COVID infection. Multiple ER visits for chronic low back pain, s/p fall on 10/26 with resultant meniscus tear and persistent knee pain. On 11/10 eval at [facility] in [place] for worsening knee pain and discharged to SNF rehab and f/u with ortho.  She has since returned back home and plans on starting therapy at home soon. Recent f/u  with ortho for knee aspiration but c/o continued pain limiting ambulation and activity.   No further concerns at this time    History provided for reference purposes only Consult visit 03/12/2021 Dr. Leonie Man: [name] is a 70 year old pleasant African-American lady seen today for initial office consultation visit for stroke.  She is accompanied by her son.  History is obtained from them and review of electronic medical records and I personally reviewed pertinent available imaging films in PACS.  She has past medical history of hypertension, chronic kidney disease, rheumatoid arthritis, TIA and stroke in 2020 with recent multiple hospitalizations for altered mental status due to urinary retention, acute kidney injury, colonic stricture.  She was admitted on 12/15/2020 with increasing confusion with a very poor history with blood pressure being significantly elevated.  She had an MRI scan of the brain which showed a tiny subacute left cerebellar white matter infarct.  MRI also showed several areas of microhemorrhages as well as extensive changes of chronic small vessel disease.  Vascular imaging study of the brain and neck were not done.  Patient had no symptoms of slurred speech or ataxia.  2D echo showed ejection fraction of 55 to 60% without cardiac source of embolism.  LDL cholesterol was 93 mg percent and patient was on Lipitor 40 mg daily at that time and the dose was not changed.  Hemoglobin A1c was 6.4 on 01/26/2021.  Patient states she is done well since then she was rehospitalized 1 more time but now she is doing all right.  She has no focal neurological symptoms.  She denies any recent symptoms of stroke or TIA.  Review of electronic medical records show multiple recent hospitalizations in the last couple of years for metabolic encephalopathy which has been multifactorial due to multiple medical illnesses.  MRI scan of the brain 05/13/2019 showed 2 small acute chronic lacunar infarcts in the right  frontal white matter and right centrum semiovale.  MR angiograms of the neck and brain on 03/31/2019 showed no large vessel stenosis or occlusion.  She did not have neurological consultation during that admission which was complicated by medical issues or even outpatient follow-up to current visit     Review ROS:   14 system review of systems is positive for those listed in HPI and all other systems negative  PMH:  Past Medical History:  Diagnosis Date   Acute on chronic kidney failure (HCC)    Anemia    Arthritis    Chest pain    Diverticulitis    Hypertension    Stroke [facility](HCC)    UTI (urinary tract infection) 10/2019    Social History:  Social History   Socioeconomic History   Marital status: Divorced    Spouse name: Not on file   Number of children: Not on file   Years of education: Not on file   Highest education level: Not on file  Occupational History   Not on file  Tobacco Use   Smoking status: Never    Passive exposure: Never   Smokeless tobacco: Never  Vaping Use   Vaping Use: Never used  Substance and Sexual Activity   Alcohol use: Not Currently    Alcohol/week: 0.0 standard drinks    Comment: sometimes    Drug use: No   Sexual activity: Yes    Partners: Male    Birth control/protection: Condom  Other Topics Concern   Not on file  Social History Narrative   Lives alone in own home   Left handed   Drinks no caffeine   Social Determinants of Health   Financial Resource Strain: Not on file  Food Insecurity: Not on file  Transportation Needs: Not on file  Physical Activity: Not on file  Stress: Not on file  Social Connections: Not on file  Intimate Partner Violence: Not on file    Medications:   Current Outpatient Medications on File Prior to Visit  Medication Sig Dispense Refill   carvedilol (COREG) 25 MG tablet Take 1 tablet (25 mg total) by mouth 2 (two) times daily. 60 tablet 0   clopidogrel (PLAVIX) 75 MG tablet Take 1 tablet (75 mg total) by  mouth daily. 30 tablet 11   diltiazem (CARDIZEM CD) 180 MG 24 hr capsule Take 180 mg by mouth daily.     DULoxetine (CYMBALTA) 60 MG capsule Take 1 capsule (60 mg total) by mouth at bedtime. 30 capsule 0   HYDROcodone-acetaminophen (NORCO/VICODIN) 5-325 MG tablet Take 1 tablet by mouth every 6 (six) hours as needed (for pain). 20 tablet 0   hydroxychloroquine (PLAQUENIL) 200 MG tablet Take 1 tablet (200 mg total) by mouth daily. (Patient taking differently: Take 200 mg by mouth 2 (two) times daily.) 30 tablet 0   lactulose (CHRONULAC) 10 GM/15ML solution Take 15 mLs (10 g total) by mouth daily as needed for mild constipation. 1892 mL 0   leflunomide (ARAVA) 20 MG tablet Take 20 mg by mouth daily.     Multiple Vitamins-Minerals (CENTRUM WOMEN) TABS Take 1 tablet by mouth daily.     pantoprazole (PROTONIX) 40 MG  tablet Take 1 tablet (40 mg total) by mouth daily. 30 tablet 0   rosuvastatin (CRESTOR) 10 MG tablet Take 10 mg by mouth daily.     No current facility-administered medications on file prior to visit.    Allergies:   Allergies  Allergen Reactions   Atorvastatin Nausea And Vomiting    Physical Exam Today's Vitals   10/07/21 0901  BP: 136/82  Pulse: 74  SpO2: 97%   There is no height or weight on file to calculate BMI.   General: Mildly obese middle-aged African-American lady, seated, in no evident distress Head: head normocephalic and atraumatic.   Neck: supple with no carotid or supraclavicular bruits Cardiovascular: regular rate and rhythm, no murmurs Musculoskeletal: no deformity Skin:  no rash/petichiae Vascular:  Normal pulses all extremities  Neurologic Exam Mental Status: Awake and fully alert.  Fluent speech and language.  Oriented to place and time. Recent memory impaired and remote memory intact. Attention span, concentration and fund of knowledge likely impaired with daughter providing majority of history. Mood and affect appropriate. MMSE - Mini Mental State  Exam 10/07/2021  Orientation to time 4  Orientation to Place 5  Registration 3  Attention/ Calculation 1  Recall 3  Language- name 2 objects 2  Language- repeat 1  Language- follow 3 step command 3  Language- read & follow direction 1  Write a sentence 1  Copy design 0  Total score 24   Cranial Nerves: Pupils equal, briskly reactive to light. Extraocular movements full without nystagmus. Visual fields full to confrontation. Hearing intact. Facial sensation intact. Face, tongue, palate moves normally and symmetrically.  Motor: Normal bulk and tone. Normal strength in all tested extremity muscles. Sensory.: intact to touch , pinprick , position and vibratory sensation.  Coordination: Rapid alternating movements normal in all extremities. Finger-to-nose and heel-to-shin performed accurately bilaterally. Gait and Station: deferred as currently in w/c Reflexes: 1+ and symmetric. Toes downgoing.       ASSESSMENT: 70 year old African-American lady with silent left cerebellar infarct in February 2022 likely from small vessel disease with multiple vascular risk factors of hypertension, hyperlipidemia, obesity and history of lacunar strokes in June 2020. C/o gradual worsening cognition likely multifactorial with hx of prior strokes, recurrent hospitalizations (see HPI),recurrent UTIs and chronic pain     PLAN:  -Continue Plavix 75 mg daily and Crestor for secondary stroke prevention -Intolerant to atorvastatin -Continue close PCP follow-up for aggressive stroke risk factor management - maintain strict control of hypertension with blood pressure goal below 130/90 and lipids with LDL cholesterol goal below 70 mg/dL. -discussed cognitive exercises, importance of adequate sleep and healthy diet, increasing activity level as tolerated and importance of managing stroke risk factors. Plan to repeat MMSE at f/u visit   Follow-up in 6 months or call earlier if needed    CC:  [facility],  [facility]   I spent 38 minutes of face-to-face and non-face-to-face time with patient and daughter.  This included previsit chart review, lab review, study review, electronic health record documentation, patient and daughter education and discussion regarding history of prior strokes and importance of aggressive stroke risk factor management and secondary stroke prevention measures, cognitive concerns with completion and review of MMSE and answered all other questions to patient and daughter satisfaction  Ihor Austin, AGNP-BC  [facility] [address] [place], [place] [address]  Phone [phone] Fax [phone] Note: This document was prepared with digital dictation and possible smart phrase technology. Any  transcriptional errors that result from this process are unintentional.

## 2021-10-07 NOTE — Patient Instructions (Signed)
Continue clopidogrel 75 mg daily  and Crestor  for secondary stroke prevention  Continue to follow up with PCP regarding cholesterol and blood pressure management  Maintain strict control of hypertension with blood pressure goal below 130/90 and cholesterol with LDL cholesterol (bad cholesterol) goal below 70 mg/dL.   Recommend doing memory exercises such as crossword puzzles, word search, sodoku and jig saw puzzles. Ensure good sleep and healthy diet.  We will plan on repeating memory test at follow up visit.     Followup in the future with me in 6 months or call earlier if needed       Thank you for coming to see [place] at [facility]. I hope we have been able to provide you high quality care today.  You may receive a patient satisfaction survey over the next few weeks. We would appreciate your feedback and comments so that we may continue to improve ourselves and the health of our patients.   Management of Memory Problems  There are some general things you can do to help manage your memory problems.  Your memory may not in fact recover, but by using techniques and strategies you will be able to manage your memory difficulties better.  1)  Establish a routine. Try to establish and then stick to a regular routine.  By doing this, you will get used to what to expect and you will reduce the need to rely on your memory.  Also, try to do things at the same time of day, such as taking your medication or checking your calendar first thing in the morning. Think about think that you can do as a part of a regular routine and make a list.  Then enter them into a daily planner to remind you.  This will help you establish a routine.  2)  Organize your environment. Organize your environment so that it is uncluttered.  Decrease visual stimulation.  Place everyday items such as keys or cell phone in the same place every day (ie.  Basket next to front door) Use post it notes with a  brief message to yourself (ie. Turn off light, lock the door) Use labels to indicate where things go (ie. Which cupboards are for food, dishes, etc.) Keep a notepad and pen by the telephone to take messages  3)  Memory Aids A diary or journal/notebook/daily planner Making a list (shopping list, chore list, to do list that needs to be done) Using an alarm as a reminder (kitchen timer or cell phone alarm) Using cell phone to store information (Notes, Calendar, Reminders) Calendar/White board placed in a prominent position Post-it notes  In order for memory aids to be useful, you need to have good habits.  It's no good remembering to make a note in your journal if you don't remember to look in it.  Try setting aside a certain time of day to look in journal.  4)  Improving mood and managing fatigue. There may be other factors that contribute to memory difficulties.  Factors, such as anxiety, depression and tiredness can affect memory. Regular gentle exercise can help improve your mood and give you more energy. Simple relaxation techniques may help relieve symptoms of anxiety Try to get back to completing activities or hobbies you enjoyed doing in the past. Learn to pace yourself through activities to decrease fatigue. Find out about some local support groups where you can share experiences with others. Try and achieve 7-8 hours of sleep at night.

## 2021-10-14 ENCOUNTER — Other Ambulatory Visit: Payer: Self-pay | Admitting: Internal Medicine

## 2021-10-14 DIAGNOSIS — K219 Gastro-esophageal reflux disease without esophagitis: Secondary | ICD-10-CM

## 2021-10-15 IMAGING — CT CT ABD-PELV W/O CM
2 of 4 series · 16 of 46 positions shown, 18 images · non-contrast
Comparison: October 26, 2019

CLINICAL DATA: Lower abdominal pain with nausea, vomiting and
diarrhea.

EXAM:
CT ABDOMEN AND PELVIS WITHOUT CONTRAST
TECHNIQUE: Multidetector CT imaging of the abdomen and pelvis was performed
following the standard protocol without IV contrast.

[Series 3: ap without · axial · non-contrast · 0.88mm/px · z∈[-462,-32]mm · 13 of 98 slices shown, 15 images]
[im 6/98  soft-tissue]
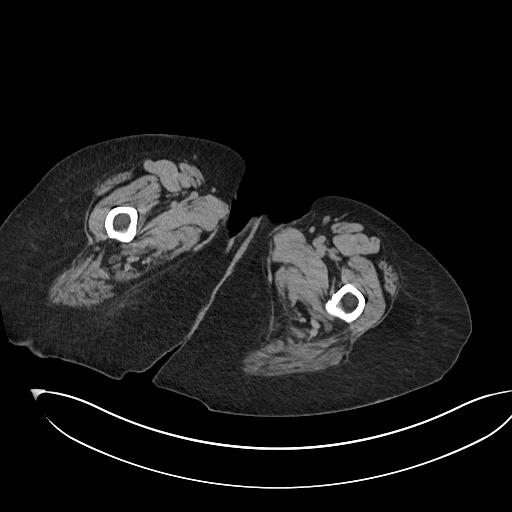
[im 6/98  bone]
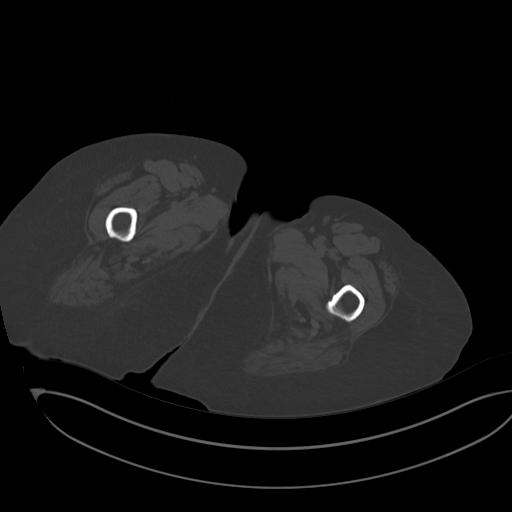
[im 11/98  soft-tissue]
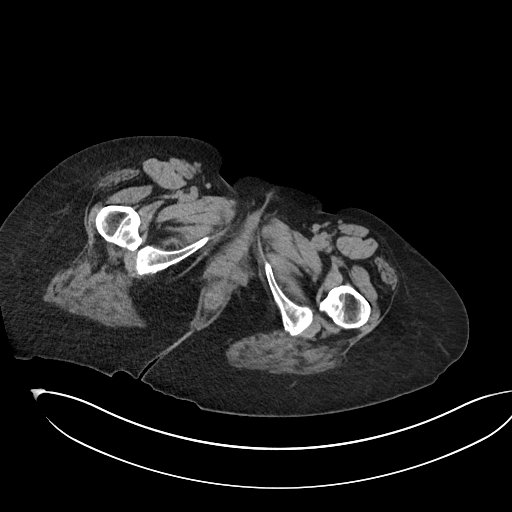
[im 22/98  soft-tissue]
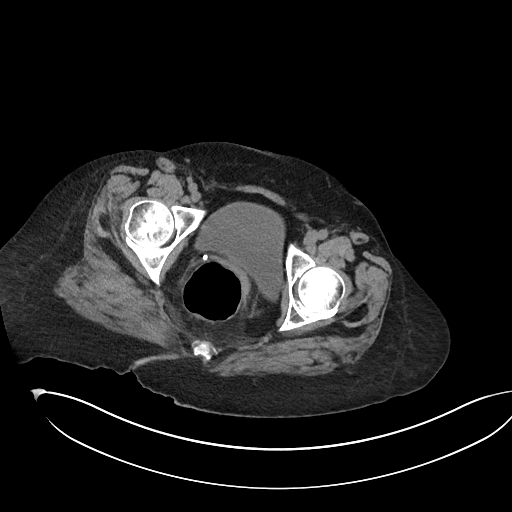
[im 27/98  soft-tissue]
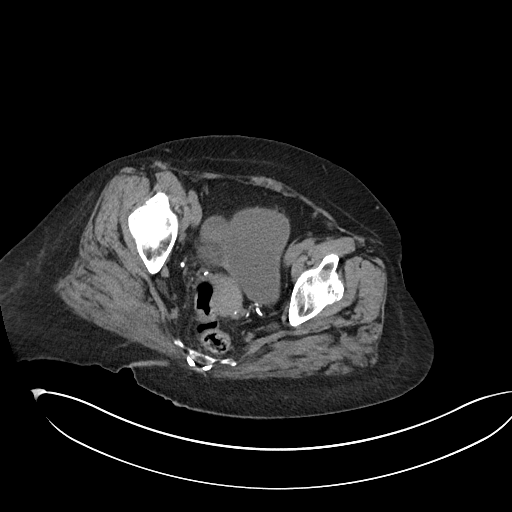
[im 33/98  soft-tissue]
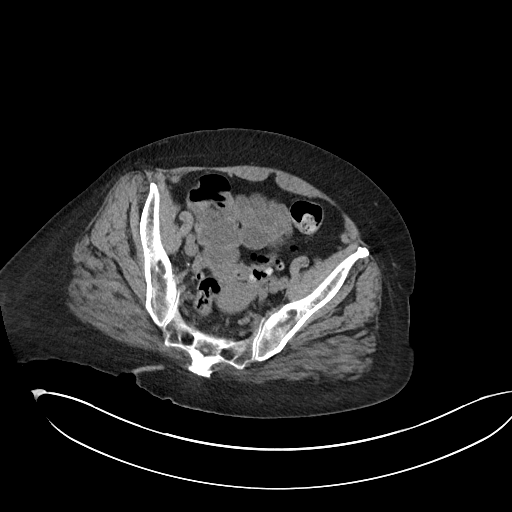
[im 44/98  soft-tissue]
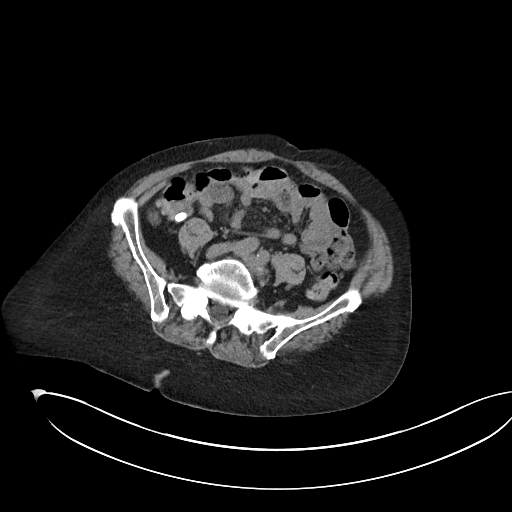
[im 49/98  soft-tissue]
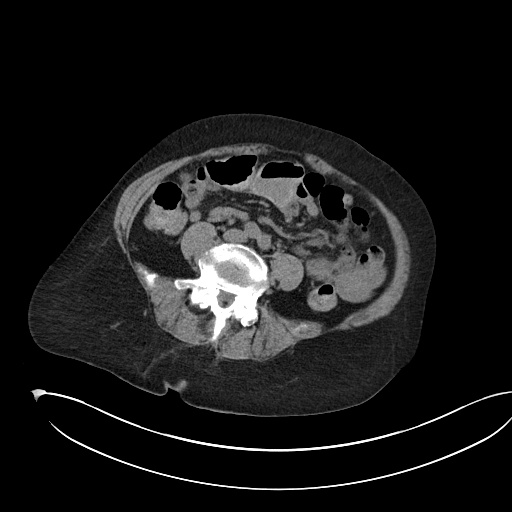
[im 54/98  soft-tissue]
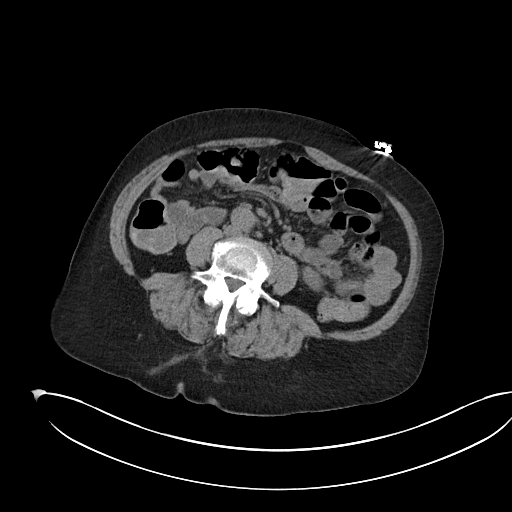
[im 65/98  soft-tissue]
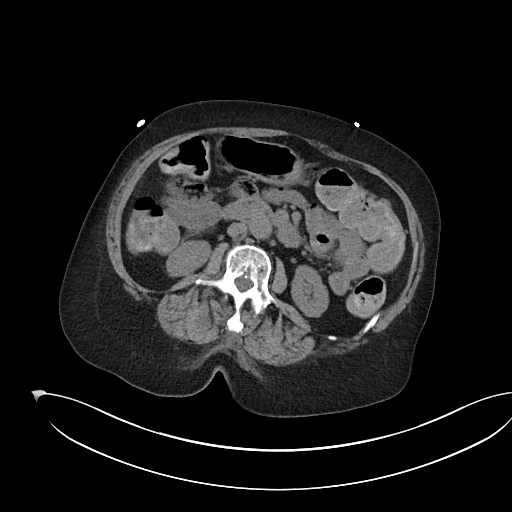
[im 65/98  bone]
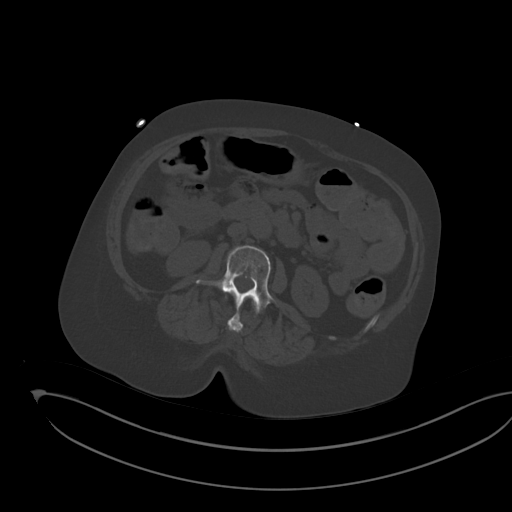
[im 71/98  soft-tissue]
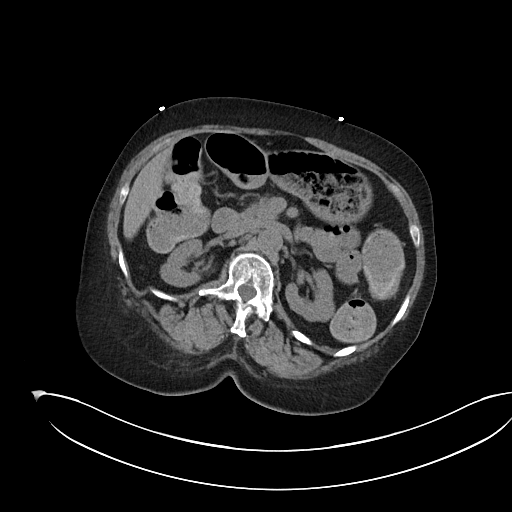
[im 76/98  soft-tissue]
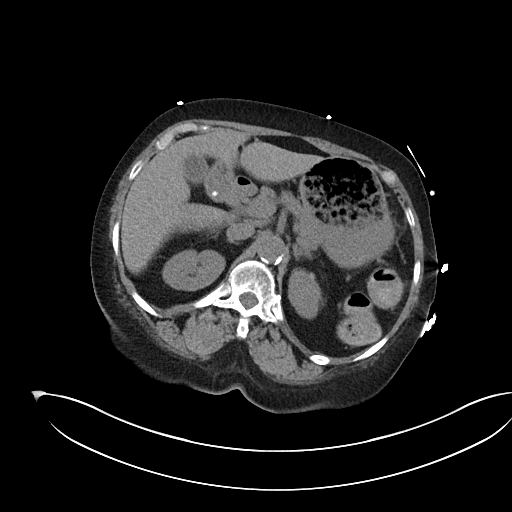
[im 87/98  soft-tissue]
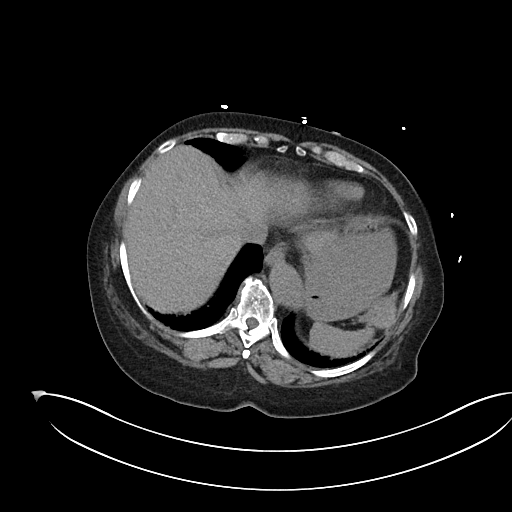
[im 92/98  soft-tissue]
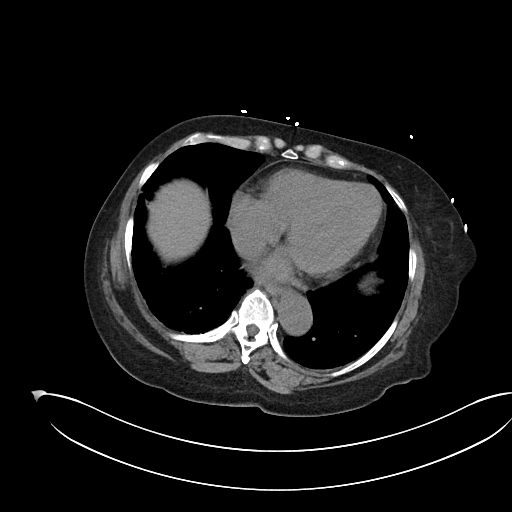

[Series 6: cor · coronal · 0.83mm/px · 3 of 99 slices shown]
[im 33/99  soft-tissue]
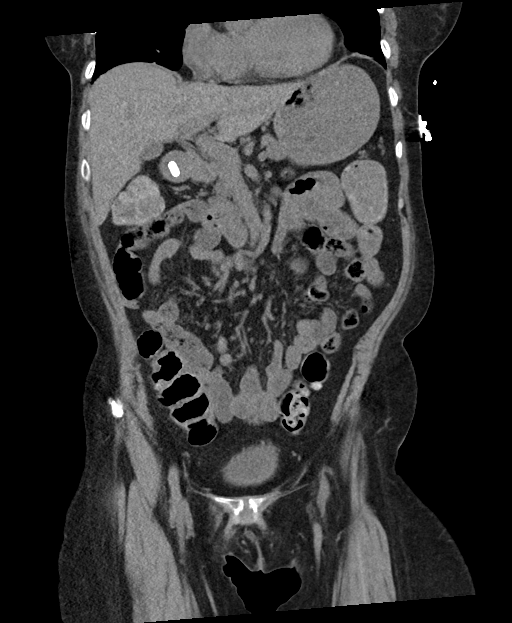
[im 44/99  soft-tissue]
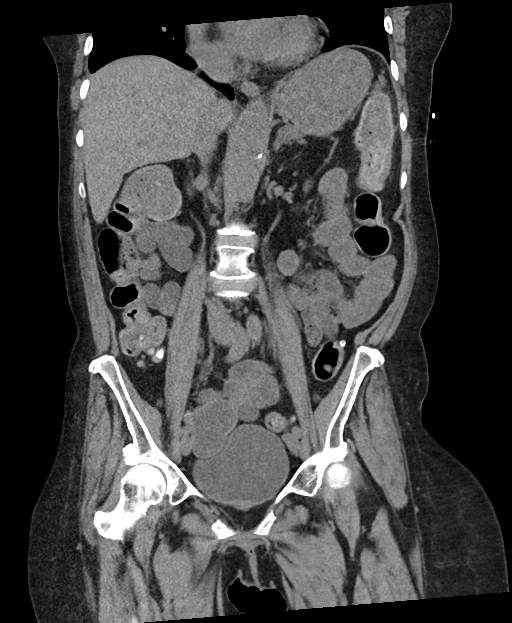
[im 55/99  soft-tissue]
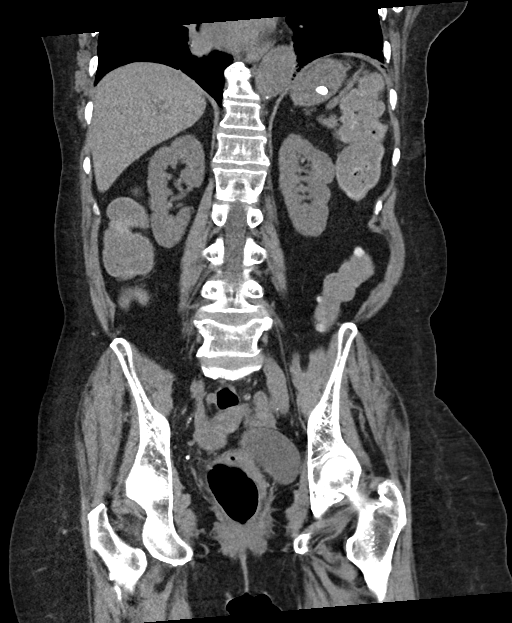

[16 of 46 positions shown; findings below may reference images not displayed]

FINDINGS: Lower chest: Mild atelectasis is seen within the bilateral lung
bases.

Hepatobiliary: No focal liver abnormality is seen. No gallstones,
gallbladder wall thickening, or biliary dilatation.

Pancreas: Unremarkable. No pancreatic ductal dilatation or
surrounding inflammatory changes.

Spleen: Normal in size without focal abnormality.

Adrenals/Urinary Tract: Adrenal glands are unremarkable. Kidneys are
normal, without renal calculi, focal lesion, or hydronephrosis.
Bladder is unremarkable.

Stomach/Bowel: Stomach is within normal limits. The appendix is not
clearly identified. No evidence of bowel wall thickening,
distention, or inflammatory changes. Noninflamed diverticula are
seen within the descending and sigmoid colon.

Vascular/Lymphatic: No significant vascular findings are present. No
enlarged abdominal or pelvic lymph nodes.

Reproductive: Uterus and bilateral adnexa are unremarkable.

Other: No abdominal wall hernia or abnormality. No abdominopelvic
ascites.

Musculoskeletal: A deformity of indeterminate age is seen along the
superior endplate of the L5 vertebral body. This represents a new
finding compared to the prior study.

Multilevel degenerative changes seen within the lumbar spine with
approximately 2 mm anterolisthesis of the L4 vertebral body on L5.
IMPRESSION: 1. Colonic diverticulosis without evidence of acute diverticulitis.
2. L5 vertebral body deformity of indeterminate age, new compared to
the prior study. Correlation with MRI is recommended.
3. Multilevel degenerative changes within the lumbar spine with
approximately 2 mm anterolisthesis of the L4 vertebral body on L5.

## 2021-10-19 ENCOUNTER — Emergency Department (HOSPITAL_COMMUNITY): Payer: Medicare Other

## 2021-10-19 ENCOUNTER — Emergency Department (HOSPITAL_COMMUNITY)
Admission: EM | Admit: 2021-10-19 | Discharge: 2021-10-19 | Disposition: A | Discharge status: Home / Self Care | Payer: Medicare Other | Attending: Emergency Medicine | Admitting: Emergency Medicine

## 2021-10-19 ENCOUNTER — Other Ambulatory Visit: Payer: Self-pay

## 2021-10-19 DIAGNOSIS — Z7902 Long term (current) use of antithrombotics/antiplatelets: Secondary | ICD-10-CM | POA: Insufficient documentation

## 2021-10-19 DIAGNOSIS — N183 Chronic kidney disease, stage 3 unspecified: Secondary | ICD-10-CM | POA: Insufficient documentation

## 2021-10-19 DIAGNOSIS — I129 Hypertensive chronic kidney disease with stage 1 through stage 4 chronic kidney disease, or unspecified chronic kidney disease: Secondary | ICD-10-CM | POA: Insufficient documentation

## 2021-10-19 DIAGNOSIS — W06XXXA Fall from bed, initial encounter: Secondary | ICD-10-CM | POA: Insufficient documentation

## 2021-10-19 DIAGNOSIS — Z79899 Other long term (current) drug therapy: Secondary | ICD-10-CM | POA: Insufficient documentation

## 2021-10-19 DIAGNOSIS — M25552 Pain in left hip: Secondary | ICD-10-CM | POA: Insufficient documentation

## 2021-10-19 MED ORDER — MORPHINE SULFATE (PF) 4 MG/ML IV SOLN: 4.0000 mg | Freq: Once | INTRAVENOUS | Status: DC

## 2021-10-19 MED ORDER — MORPHINE SULFATE (PF) 4 MG/ML IV SOLN
4.0000 mg | Freq: Once | INTRAVENOUS | Status: AC
  Administered 2021-10-19: 13:00:00 4 mg via INTRAMUSCULAR
  Filled 2021-10-19: qty 1

## 2021-10-19 MED ORDER — ONDANSETRON 4 MG PO TBDP
4.0000 mg | ORAL_TABLET | Freq: Once | ORAL | Status: AC
  Administered 2021-10-19: 13:00:00 4 mg via ORAL
  Filled 2021-10-19: qty 1

## 2021-10-19 NOTE — ED Triage Notes (Signed)
Pt here from home via [facility] for L hip pain after a mechanical fall around 0100 this morning. Pt  was walking from bathroom back to bed and tripped over something, pt states she fell into the bed, denies hitting her head. Pt is on plavix. Pt is c/o 10/10 leg pain. Per EMS pt took a 5-325mg  percocet at home and her morning BP meds prior to leaving for the hospital. 188/110, 81HR, 99% RA

## 2021-10-19 NOTE — Discharge Instructions (Addendum)
Your x-ray and CT were negative for fracture or dislocation.  You will be provided a walker in the ED, use walker as needed to aid with ambulation.  You may continue to take your prescription Percocet as needed for pain.  You may follow-up with your primary care provider as needed.  Attached you will find information for the on-call orthopedist, you may call them to set up a follow-up appointment regarding today's visit.  Return to the ED if you are experiencing increasing/worsening pain, color change, inability to walk, or worsening symptoms.

## 2021-10-19 NOTE — ED Notes (Signed)
Patient transported to CT 

## 2021-10-19 NOTE — Progress Notes (Signed)
°   10/19/21 1623  TOC ED Mini Assessment  TOC Time spent with patient (minutes): 15  PING Used in TOC Assessment No  Admission or Readmission Diverted No  Interventions which prevented an admission or readmission DME Provided  Barriers to Discharge No Barriers Identified  Barrier interventions ED RNCM received consult for DME rolling walker. RNCM Contacted  [facility] liaison Jasmine with referral for rolling walker. Dan Humphreys will be delivered to ED prior to discharge home. Updated EDP and RN.

## 2021-10-19 NOTE — ED Provider Notes (Addendum)
[facility] EMERGENCY DEPARTMENT Provider Note   CSN: [id number] Arrival date & time: 10/19/21  1101     History Chief Complaint  Patient presents with   Fall   Hip Pain    Michelle Graves is a 70 y.o. female with a past medical history of hypertension who presents to the ED brought in by EMS due to mechanical fall onset 1 AM this morning.  Patient reports she was walking to her bed tripped over something and fell onto the bed.  Denies hitting her head or LOC.  Patient is on Plavix.  Patient has associated 10/10 left upper leg pain.  Patient took her 5-325 mg Percocet at home and morning blood pressure medications prior to leaving.  She denies abdominal pain, nausea, vomiting, chest pain, shortness of breath, dizziness, lightheadedness, fever, chills, dysuria, hematuria, back pain.   The history is provided by the patient. No language interpreter was used.  Hip Pain This is a new problem. The current episode started 12 to 24 hours ago. The problem occurs rarely. The problem has not changed since onset.Pertinent negatives include no chest pain, no abdominal pain and no shortness of breath. The symptoms are aggravated by walking. Nothing relieves the symptoms. She has tried nothing for the symptoms. The treatment provided no relief.      Past Medical History:  Diagnosis Date   Acute on chronic kidney failure (HCC)    Anemia    Arthritis    Chest pain    Diverticulitis    Hypertension    Stroke [facility])    UTI (urinary tract infection) 10/2019    Patient Active Problem List   Diagnosis Date Noted   Acute alteration in mental status    Uncontrolled hypertension    Altered mental status 03/19/2021   Multinodular goiter [id number]   Acute metabolic encephalopathy [id number]   Hypertensive urgency 01/21/2021   Sepsis secondary to UTI ([place]) 01/21/2021   Adrenal insufficiency ([place]) 12/18/2020   Encephalopathy 12/17/2020   TIA (transient ischemic attack)  12/15/2020   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   Bowel obstruction ([place]) 05/06/2020   SIRS (systemic inflammatory response syndrome) ([place]) 05/06/2020   UTI (urinary tract infection) 11/08/2019   Major depressive disorder, recurrent episode, moderate ([place]) 11/08/2019   Urinary retention 10/28/2019   Acute on chronic kidney failure ([place]) 10/27/2019   Malnutrition of moderate degree 10/03/2019   Colitis 09/30/2019   AKI (acute kidney injury) ([place]) 05/28/2019   Nausea & vomiting 05/28/2019   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   Anemia, chronic disease 05/28/2019   Swelling    Small vessel disease, cerebrovascular    Acute ischemic stroke ([place])    Weakness    Acute left ankle pain    Acute on chronic renal failure ([place]) 05/13/2019   Weakness of both lower extremities 05/13/2019   Cerebral thrombosis with cerebral infarction 03/31/2019   New onset a-fib ([place]) 03/27/2019   Acute kidney injury superimposed on CKD ([place]) 03/24/2019   Hypotension 03/24/2019   Hematemesis 03/24/2019   Dark stools 03/24/2019   Diverticulitis of intestine without perforation or abscess without bleeding    Sinus tachycardia 08/27/2016   Diverticulitis 08/26/2016   HYPERTENSION, MALIGNANT ESSENTIAL 06/24/2007   CKD (chronic kidney disease), stage III ([place]) 06/24/2007   Rheumatoid arthritis ([place]) 06/24/2007    Past Surgical History:  Procedure Laterality Date   BIOPSY  04/01/2019   Procedure: BIOPSY;  Surgeon: Otis Brace, MD;  Location: [place];  Service: Gastroenterology;;  BIOPSY  10/05/2019   Procedure: BIOPSY;  Surgeon: Otis Brace, MD;  Location: [place];  Service: Gastroenterology;;   BIOPSY  05/10/2020   Procedure: BIOPSY;  Surgeon: Otis Brace, MD;  Location: [facility] ENDOSCOPY;  Service: Gastroenterology;;   BIOPSY  11/29/2020   Procedure: BIOPSY;  Surgeon: Wilford Corner, MD;  Location: [place];  Service: Gastroenterology;;   COLONOSCOPY N/A 11/29/2020   Procedure:  COLONOSCOPY;  Surgeon: Wilford Corner, MD;  Location: [place];  Service: Gastroenterology;  Laterality: N/A;   COLONOSCOPY WITH PROPOFOL N/A 04/01/2019   Procedure: COLONOSCOPY WITH PROPOFOL;  Surgeon: Otis Brace, MD;  Location: [place];  Service: Gastroenterology;  Laterality: N/A;   COLONOSCOPY WITH PROPOFOL N/A 10/05/2019   Procedure: COLONOSCOPY WITH PROPOFOL;  Surgeon: Otis Brace, MD;  Location: [place];  Service: Gastroenterology;  Laterality: N/A;   COLONOSCOPY WITH PROPOFOL N/A 05/10/2020   Procedure: COLONOSCOPY WITH PROPOFOL;  Surgeon: Otis Brace, MD;  Location: [place];  Service: Gastroenterology;  Laterality: N/A;   ESOPHAGOGASTRODUODENOSCOPY (EGD) WITH PROPOFOL N/A 04/01/2019   Procedure: ESOPHAGOGASTRODUODENOSCOPY (EGD) WITH PROPOFOL;  Surgeon: Otis Brace, MD;  Location: [place];  Service: Gastroenterology;  Laterality: N/A;   ESOPHAGOGASTRODUODENOSCOPY (EGD) WITH PROPOFOL N/A 10/05/2019   Procedure: ESOPHAGOGASTRODUODENOSCOPY (EGD) WITH PROPOFOL;  Surgeon: Otis Brace, MD;  Location: [place];  Service: Gastroenterology;  Laterality: N/A;   POLYPECTOMY  11/29/2020   Procedure: POLYPECTOMY;  Surgeon: Wilford Corner, MD;  Location: [facility] ENDOSCOPY;  Service: Gastroenterology;;   SUBMUCOSAL TATTOO INJECTION  11/29/2020   Procedure: SUBMUCOSAL TATTOO INJECTION;  Surgeon: Wilford Corner, MD;  Location: [facility] ENDOSCOPY;  Service: Gastroenterology;;   TONSILLECTOMY       OB History   No obstetric history on file.     Family History  Problem Relation Age of Onset   Hypertension Mother    Diabetes Mother    CAD Father        died of MI at age 47   Hypertension Father    Diabetes Sister    Diabetes Sister    Kidney disease Neg Hx    Adrenal disorder Neg Hx     Social History   Tobacco Use   Smoking status: Never    Passive exposure: Never   Smokeless tobacco: Never  Vaping Use   Vaping Use: Never used  Substance  Use Topics   Alcohol use: Not Currently    Alcohol/week: 0.0 standard drinks    Comment: sometimes    Drug use: No    Home Medications Prior to Admission medications   Medication Sig Start Date End Date Taking? Authorizing Provider  carvedilol (COREG) 25 MG tablet Take 1 tablet (25 mg total) by mouth 2 (two) times daily. 01/03/21   Fargo, Amy E, NP  clopidogrel (PLAVIX) 75 MG tablet Take 1 tablet (75 mg total) by mouth daily. 03/12/21   Garvin Fila, MD  diltiazem (CARDIZEM CD) 180 MG 24 hr capsule Take 180 mg by mouth daily. 04/01/21   [provider]  DULoxetine (CYMBALTA) 60 MG capsule Take 1 capsule (60 mg total) by mouth at bedtime. 01/03/21   Fargo, Amy E, NP  HYDROcodone-acetaminophen (NORCO/VICODIN) 5-325 MG tablet Take 1 tablet by mouth every 6 (six) hours as needed (for pain). 08/21/21   Molpus, John, MD  hydroxychloroquine (PLAQUENIL) 200 MG tablet Take 1 tablet (200 mg total) by mouth daily. Patient taking differently: Take 200 mg by mouth 2 (two) times daily. 01/03/21   Fargo, Amy E, NP  lactulose ([place]) 10 GM/15ML  solution Take 15 mLs (10 g total) by mouth daily as needed for mild constipation. 01/29/21   Zannie Cove, MD  leflunomide (ARAVA) 20 MG tablet Take 20 mg by mouth daily. 05/27/21   [provider]  Multiple Vitamins-Minerals (CENTRUM WOMEN) TABS Take 1 tablet by mouth daily.    [provider]  pantoprazole (PROTONIX) 40 MG tablet Take 1 tablet (40 mg total) by mouth daily. 01/03/21   Fargo, Amy E, NP  rosuvastatin (CRESTOR) 10 MG tablet Take 10 mg by mouth daily.    [provider]    Allergies    Atorvastatin  Review of Systems   Review of Systems  Constitutional:  Negative for chills and fever.  Respiratory:  Negative for shortness of breath.   Cardiovascular:  Negative for chest pain.  Gastrointestinal:  Negative for abdominal pain, nausea and vomiting.  Genitourinary:  Negative for dysuria and hematuria.   Musculoskeletal:  Positive for arthralgias (Left hip pain) and gait problem (Secondary to left hip pain). Negative for joint swelling.  Skin:  Negative for color change and wound.  Neurological:  Negative for dizziness, syncope, weakness, light-headedness and numbness.  All other systems reviewed and are negative.  Physical Exam Updated Vital Signs BP (!) 154/102    Pulse 85    Temp 99.1 F (37.3 C)    Resp 20    SpO2 97%   Physical Exam Vitals and nursing note reviewed.  Constitutional:      General: She is not in acute distress.    Appearance: She is not diaphoretic.  HENT:     Head: Normocephalic and atraumatic.     Mouth/Throat:     Pharynx: No oropharyngeal exudate.  Eyes:     General: No scleral icterus.    Conjunctiva/sclera: Conjunctivae normal.  Cardiovascular:     Rate and Rhythm: Normal rate and regular rhythm.     Pulses: Normal pulses.     Heart sounds: Normal heart sounds.  Pulmonary:     Effort: Pulmonary effort is normal. No respiratory distress.     Breath sounds: Normal breath sounds. No wheezing.  Abdominal:     General: Bowel sounds are normal.     Palpations: Abdomen is soft. There is no mass.     Tenderness: There is no abdominal tenderness. There is no guarding or rebound.  Musculoskeletal:     Cervical back: Normal range of motion and neck supple.     Right hip: Normal.     Left hip: Tenderness and bony tenderness present. No deformity, lacerations or crepitus. Decreased range of motion. Decreased strength.     Comments: Tenderness to palpation to left lateral hip and thigh.  No overlying skin changes.  Decreased strength secondary to pain of left leg.  Sensation intact to bilateral lower extremities.  Strength and sensation intact to bilateral upper extremities.   Skin:    General: Skin is warm and dry.  Neurological:     Mental Status: She is alert.  Psychiatric:        Behavior: Behavior normal.    ED Results / Procedures / Treatments    Labs (all labs ordered are listed, but only abnormal results are displayed) Labs Reviewed - No data to display  EKG None  Radiology CT Hip Left Wo Contrast  Result Date: 10/19/2021 CLINICAL DATA:  Trauma, fall EXAM: CT OF THE LEFT HIP WITHOUT CONTRAST TECHNIQUE: Multidetector CT imaging of the left hip was performed according to the standard protocol. Multiplanar CT  image reconstructions were also generated. COMPARISON:  Radiographs done earlier today FINDINGS: Bones/Joint/Cartilage No recent fracture or dislocation is seen. Degenerative changes are noted with bony spurs in the left hip. There is cortical thickening in the medial margin of neck of left femur without definite fracture line. There is no disruption of trabeculae in the head and neck of proximal left humerus. Degenerative changes with bony spurs are noted in the left hip joint. There is decrease in height of upper endplate of body of L5 vertebra. Diverticula are seen in the visualized left colon. Ligaments Not adequately evaluated. Muscles and Tendons Unremarkable. Soft tissues There is no demonstrable soft tissue hematoma. IMPRESSION: No recent fracture or dislocation is seen in the left hip. Degenerative changes are noted in the left hip joint. There is decrease in height of upper endplate of body of L5 vertebra which may suggest recent or old compression. Diverticulosis of colon. Electronically Signed   By: Elmer Picker M.D.   On: 10/19/2021 13:44   DG Hip Unilat W or Wo Pelvis 2-3 Views Left  Result Date: 10/19/2021 CLINICAL DATA:  LEFT hip pain EXAM: DG HIP (WITH OR WITHOUT PELVIS) 2-3V LEFT COMPARISON:  06/27/2021 FINDINGS: There is no evidence of hip fracture or dislocation. There is no evidence of arthropathy or other focal bone abnormality. Osteophytosis of the hip joints. IMPRESSION: No evidence of hip fracture or dislocation. Osteophytosis of the hip joints. Electronically Signed   By: Suzy Bouchard M.D.   On:  10/19/2021 12:31    Procedures Procedures   Medications Ordered in ED Medications  ondansetron (ZOFRAN-ODT) disintegrating tablet 4 mg (4 mg Oral Given 10/19/21 1326)  morphine 4 MG/ML injection 4 mg (4 mg Intramuscular Given 10/19/21 1327)    ED Course  I have reviewed the triage vital signs and the nursing notes.  Pertinent labs & imaging results that were available during my care of the patient were reviewed by me and considered in my medical decision making (see chart for details).  Clinical Course as of 10/19/21 1558  Sat Oct 19, 2021  1531 Ambulated patient along with RN.  Patient able to ambulate with walker without assistance or difficulty.  Patient appears safe for discharge at this time. [SB]  1535 Patient waiting on walker at discharge. [SB]    Clinical Course User Index [SB] Vietta Bonifield A, PA-C   MDM Rules/Calculators/A&P                         Pt with fall occurring 1 AM this morning.  Patient with mechanical fall causing her to land on her bed.  Patient denies hitting her head or LOC.  Patient on Plavix.  On exam, patient with tenderness to palpation to left lateral hip and thigh.  Decreased strength secondary to pain.  Sensation intact to bilateral lower extremities.  DP and PT pulses intact bilaterally.  Differential diagnosis includes fracture, dislocation, arthritis.  Left hip x-ray imaging without acute fracture or dislocation.  Patient given morphine and Zofran in the ED with improvement of her symptoms.  CT left hip showed no acute fracture or dislocation.  Ambulated patient along with RN with the assistance of a walker to the bathroom.  Patient able to ambulate without difficulty.  This is likely musculoskeletal pain. Case discussed with attending who agrees with discharge treatment plan.   PDMP reviewed during this encounter.  PDMP review, patient with Percocet prescription prescribed on 10/03/2021, 30-day prescription x2 months.  No narcotics prescribed today.   Patient advised she can take her prescription Percocet as prescribed.  Supportive care measures and strict return precautions discussed with patient.  Patient acknowledges and verbalized understanding. Appears safe for discharge at this time. Follow up as indicated in discharge paperwork.   Patient case discussed with Raynald Blend, PA-C at sign-out.  Plan: walker to be obtained and can be safely discharged.  Patient care transferred at sign out.   Final Clinical Impression(s) / ED Diagnoses Final diagnoses:  Left hip pain    Rx / DC Orders ED Discharge Orders          Ordered    For home use only DME 4 wheeled rolling walker with seat        10/19/21 1556             Carisha Kantor A, PA-C 10/19/21 1559    Jacalyn Lefevre, MD 10/19/21 [address] Zayda Angell A, PA-C 10/19/21 1612    Jacalyn Lefevre, MD 10/20/21 [id number]

## 2021-10-19 NOTE — ED Notes (Signed)
RN and PA ambulated pt to bathroom w/ walker. Pt c/o hip pain, steady ambulation.

## 2021-10-19 NOTE — ED Notes (Signed)
RN reviewed discharge instructions and follow up w/ pt. Pt left ED w/ walker and daughter.

## 2021-10-21 ENCOUNTER — Other Ambulatory Visit: Payer: Self-pay

## 2021-10-21 ENCOUNTER — Emergency Department (HOSPITAL_COMMUNITY)
Admission: EM | Admit: 2021-10-21 | Discharge: 2021-10-23 | Disposition: A | Discharge status: Rehabilitation Facility | Payer: Medicare Other | Attending: Emergency Medicine | Admitting: Emergency Medicine

## 2021-10-21 ENCOUNTER — Encounter (HOSPITAL_COMMUNITY): Payer: Self-pay | Admitting: Emergency Medicine

## 2021-10-21 DIAGNOSIS — Z79899 Other long term (current) drug therapy: Secondary | ICD-10-CM | POA: Diagnosis not present

## 2021-10-21 DIAGNOSIS — R531 Weakness: Secondary | ICD-10-CM | POA: Insufficient documentation

## 2021-10-21 DIAGNOSIS — M25552 Pain in left hip: Secondary | ICD-10-CM | POA: Diagnosis present

## 2021-10-21 DIAGNOSIS — I129 Hypertensive chronic kidney disease with stage 1 through stage 4 chronic kidney disease, or unspecified chronic kidney disease: Secondary | ICD-10-CM | POA: Diagnosis not present

## 2021-10-21 DIAGNOSIS — D631 Anemia in chronic kidney disease: Secondary | ICD-10-CM | POA: Insufficient documentation

## 2021-10-21 DIAGNOSIS — R262 Difficulty in walking, not elsewhere classified: Secondary | ICD-10-CM

## 2021-10-21 DIAGNOSIS — N183 Chronic kidney disease, stage 3 unspecified: Secondary | ICD-10-CM | POA: Insufficient documentation

## 2021-10-21 DIAGNOSIS — Z7902 Long term (current) use of antithrombotics/antiplatelets: Secondary | ICD-10-CM | POA: Diagnosis not present

## 2021-10-21 DIAGNOSIS — Z789 Other specified health status: Secondary | ICD-10-CM

## 2021-10-21 DIAGNOSIS — Z20822 Contact with and (suspected) exposure to covid-19: Secondary | ICD-10-CM | POA: Diagnosis not present

## 2021-10-21 NOTE — ED Triage Notes (Signed)
BIBA Per EMS: Pt coming from home w/ c/o Left hip pain; seen at [facility] on the 24th for same & dx w/ muscle pain & given pain meds; she denies pain relief & wants to be seen again.

## 2021-10-21 NOTE — ED Provider Notes (Signed)
Emergency Medicine Provider Triage Evaluation Note  Michelle Graves , a 70 y.o. female  was evaluated in triage.  Pt complains of left hip pain.  States that she suffered a fall oral on 12/24.  Pain has been constant since there.  Patient denies any relief in symptoms with her prescribed pain medication.  Patient states that she did take oxycodone earlier today.  Denies any new falls or injuries.  Per chart review patient had x-ray and CT of left hip which was unremarkable.  Patient was able to stand and ambulate with walker after pain medications.  Review of Systems  Positive: Left hip pain Negative: Numbness, weakness, saddle anesthesia, bowel or bladder dysfunction  Physical Exam  BP (!) 185/116 (BP Location: Left Arm)    Pulse 77    Temp 98.1 F (36.7 C) (Oral)    Resp 16    SpO2 96%  Gen:   Awake, no distress   Resp:  Normal effort  MSK:   Moves extremities without difficulty  Other:  Tenderness to medial aspect of left thigh.  Medical Decision Making  Medically screening exam initiated at 4:05 PM.  Appropriate orders placed.  Michelle Graves was informed that the remainder of the evaluation will be completed by another provider, this initial triage assessment does not replace that evaluation, and the importance of remaining in the ED until their evaluation is complete.  Negative CT scan of left hip on 12/24.  No further imaging ordered at this time she has not had any new falls or injuries.   Haskel Schroeder, PA-C 10/21/21 1606    Rozelle Logan, DO 10/22/21 1635

## 2021-10-21 NOTE — ED Provider Notes (Signed)
[facility] DEPT Provider Note   CSN: [id number] Arrival date & time: 10/21/21  1437     History Chief Complaint  Patient presents with   Hip Pain    Michelle Graves is a 70 y.o. female.  70 year old female who presents to the emerged from today with persistent left-sided pain.  Patient states that she was seen here couple days ago for left leg pain.  States was atraumatic.  It was in her left hip.  She had a CT and x-ray which were negative.  Per the notes the patient is able to ambulate with a walker after pain medication.  She was sent home.  Patient states that the pain got worse approximate 24 hours later.  She states that it is in her left hip and radiates down towards her left knee and the whole area hurts.  She states it hurts too much to walk even with a walker.  No new injuries.  On review of the records it appears that she has had some generalized weakness, balance issues, multiple falls and multiple musculoskeletal pain complaints over the last year or so.  Some these have been addressed in emergency room and some of been addressed by her primary doctor.  She is pending home health evaluation and treatment but this has not happened yet.  She has a walker, wheelchair and other implements at home to try to help her.   Hip Pain      Past Medical History:  Diagnosis Date   Acute on chronic kidney failure ([place])    Anemia    Arthritis    Chest pain    Diverticulitis    Hypertension    Stroke [facility])    UTI (urinary tract infection) 10/2019    Patient Active Problem List   Diagnosis Date Noted   Acute alteration in mental status    Uncontrolled hypertension    Altered mental status 03/19/2021   Multinodular goiter [id number]   Acute metabolic encephalopathy [id number]   Hypertensive urgency 01/21/2021   Sepsis secondary to UTI ([place]) 01/21/2021   Adrenal insufficiency ([place]) 12/18/2020   Encephalopathy 12/17/2020   TIA (transient  ischemic attack) 12/15/2020   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   Bowel obstruction ([place]) 05/06/2020   SIRS (systemic inflammatory response syndrome) ([place]) 05/06/2020   UTI (urinary tract infection) 11/08/2019   Major depressive disorder, recurrent episode, moderate ([place]) 11/08/2019   Urinary retention 10/28/2019   Acute on chronic kidney failure ([place]) 10/27/2019   Malnutrition of moderate degree 10/03/2019   Colitis 09/30/2019   AKI (acute kidney injury) ([place]) 05/28/2019   Nausea & vomiting 05/28/2019   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   Anemia, chronic disease 05/28/2019   Swelling    Small vessel disease, cerebrovascular    Acute ischemic stroke ([place])    Weakness    Acute left ankle pain    Acute on chronic renal failure ([place]) 05/13/2019   Weakness of both lower extremities 05/13/2019   Cerebral thrombosis with cerebral infarction 03/31/2019   New onset a-fib ([place]) 03/27/2019   Acute kidney injury superimposed on CKD ([place]) 03/24/2019   Hypotension 03/24/2019   Hematemesis 03/24/2019   Dark stools 03/24/2019   Diverticulitis of intestine without perforation or abscess without bleeding    Sinus tachycardia 08/27/2016   Diverticulitis 08/26/2016   HYPERTENSION, MALIGNANT ESSENTIAL 06/24/2007   CKD (chronic kidney disease), stage III ([place]) 06/24/2007   Rheumatoid arthritis ([place]) 06/24/2007    Past Surgical History:  Procedure Laterality  Date   BIOPSY  04/01/2019   Procedure: BIOPSY;  Surgeon: Otis Brace, MD;  Location: [place];  Service: Gastroenterology;;   BIOPSY  10/05/2019   Procedure: BIOPSY;  Surgeon: Otis Brace, MD;  Location: [place];  Service: Gastroenterology;;   BIOPSY  05/10/2020   Procedure: BIOPSY;  Surgeon: Otis Brace, MD;  Location: [facility] ENDOSCOPY;  Service: Gastroenterology;;   BIOPSY  11/29/2020   Procedure: BIOPSY;  Surgeon: Wilford Corner, MD;  Location: [place];  Service: Gastroenterology;;   COLONOSCOPY N/A 11/29/2020    Procedure: COLONOSCOPY;  Surgeon: Wilford Corner, MD;  Location: [place];  Service: Gastroenterology;  Laterality: N/A;   COLONOSCOPY WITH PROPOFOL N/A 04/01/2019   Procedure: COLONOSCOPY WITH PROPOFOL;  Surgeon: Otis Brace, MD;  Location: [place];  Service: Gastroenterology;  Laterality: N/A;   COLONOSCOPY WITH PROPOFOL N/A 10/05/2019   Procedure: COLONOSCOPY WITH PROPOFOL;  Surgeon: Otis Brace, MD;  Location: [place];  Service: Gastroenterology;  Laterality: N/A;   COLONOSCOPY WITH PROPOFOL N/A 05/10/2020   Procedure: COLONOSCOPY WITH PROPOFOL;  Surgeon: Otis Brace, MD;  Location: [place];  Service: Gastroenterology;  Laterality: N/A;   ESOPHAGOGASTRODUODENOSCOPY (EGD) WITH PROPOFOL N/A 04/01/2019   Procedure: ESOPHAGOGASTRODUODENOSCOPY (EGD) WITH PROPOFOL;  Surgeon: Otis Brace, MD;  Location: [place];  Service: Gastroenterology;  Laterality: N/A;   ESOPHAGOGASTRODUODENOSCOPY (EGD) WITH PROPOFOL N/A 10/05/2019   Procedure: ESOPHAGOGASTRODUODENOSCOPY (EGD) WITH PROPOFOL;  Surgeon: Otis Brace, MD;  Location: [place];  Service: Gastroenterology;  Laterality: N/A;   POLYPECTOMY  11/29/2020   Procedure: POLYPECTOMY;  Surgeon: Wilford Corner, MD;  Location[facility] ENDOSCOPY;  Service: Gastroenterology;;   SUBMUCOSAL TATTOO INJECTION  11/29/2020   Procedure: SUBMUCOSAL TATTOO INJECTION;  Surgeon: Wilford Corner, MD;  Location: [facility] ENDOSCOPY;  Service: Gastroenterology;;   TONSILLECTOMY       OB History   No obstetric history on file.     Family History  Problem Relation Age of Onset   Hypertension Mother    Diabetes Mother    CAD Father        died of MI at age 70   Hypertension Father    Diabetes Sister    Diabetes Sister    Kidney disease Neg Hx    Adrenal disorder Neg Hx     Social History   Tobacco Use   Smoking status: Never    Passive exposure: Never   Smokeless tobacco: Never  Vaping Use   Vaping Use: Never used   Substance Use Topics   Alcohol use: Not Currently    Alcohol/week: 0.0 standard drinks    Comment: sometimes    Drug use: No    Home Medications Prior to Admission medications   Medication Sig Start Date End Date Taking? Authorizing Provider  carvedilol (COREG) 25 MG tablet Take 1 tablet (25 mg total) by mouth 2 (two) times daily. 01/03/21  Yes Fargo, Amy E, NP  clopidogrel (PLAVIX) 75 MG tablet Take 1 tablet (75 mg total) by mouth daily. 03/12/21  Yes Garvin Fila, MD  diclofenac Sodium (VOLTAREN) 1 % GEL Apply 1 application topically daily as needed (for muscle or joint pain). 09/17/21  Yes [provider]  diltiazem (CARDIZEM CD) 180 MG 24 hr capsule Take 180 mg by mouth daily. 04/01/21  Yes [provider]  DULoxetine (CYMBALTA) 60 MG capsule Take 1 capsule (60 mg total) by mouth at bedtime. 01/03/21  Yes Fargo, Amy E, NP  folic acid (FOLVITE) 1 MG tablet Take 1 mg by mouth daily. 10/11/21  Yes [provider]  hydroxychloroquine (PLAQUENIL) 200 MG tablet Take 1 tablet (200 mg total) by mouth daily. Patient taking differently: Take 200 mg by mouth 2 (two) times daily. 01/03/21  Yes Fargo, Amy E, NP  leflunomide (ARAVA) 20 MG tablet Take 20 mg by mouth daily. 05/27/21  Yes [provider]  Multiple Vitamins-Minerals (CENTRUM WOMEN) TABS Take 1 tablet by mouth daily.   Yes [provider]  oxyCODONE-acetaminophen (PERCOCET/ROXICET) 5-325 MG tablet Take 1 tablet by mouth 2 (two) times daily as needed for moderate pain. 10/03/21  Yes [provider]  pantoprazole (PROTONIX) 40 MG tablet Take 1 tablet (40 mg total) by mouth daily. Patient taking differently: Take 40 mg by mouth daily as needed (for acid reflux). 01/03/21  Yes Fargo, Amy E, NP  rosuvastatin (CRESTOR) 10 MG tablet Take 10 mg by mouth daily.   Yes [provider]  traZODone (DESYREL) 50 MG tablet Take 50 mg by mouth at bedtime as needed for sleep. 10/08/21  Yes [provider]    Allergies    Atorvastatin  Review of Systems   Review of Systems  All other systems reviewed and are negative.  Physical Exam Updated Vital Signs BP (!) 184/104    Pulse 73    Temp 98.1 F (36.7 C) (Oral)    Resp 16    SpO2 97%   Physical Exam Vitals and nursing note reviewed.  Constitutional:      Appearance: She is well-developed.  HENT:     Head: Normocephalic and atraumatic.     Nose: Nose normal. No congestion or rhinorrhea.     Mouth/Throat:     Mouth: Mucous membranes are moist.     Pharynx: Oropharynx is clear.  Eyes:     Pupils: Pupils are equal, round, and reactive to light.  Cardiovascular:     Rate and Rhythm: Normal rate and regular rhythm.  Pulmonary:     Effort: No respiratory distress.     Breath sounds: No stridor.  Abdominal:     General: There is no distension.  Musculoskeletal:        General: Tenderness (with palpation of her left femur. no pain with passive ROM.) present. Normal range of motion.     Cervical back: Normal range of motion.  Skin:    General: Skin is warm and dry.  Neurological:     General: No focal deficit present.     Mental Status: She is alert.    ED Results / Procedures / Treatments   Labs (all labs ordered are listed, but only abnormal results are displayed) Labs Reviewed  CBC WITH DIFFERENTIAL/PLATELET - Abnormal; Notable for the following components:      Result Value   RBC 3.80 (*)    Hemoglobin 11.6 (*)    Platelets 134 (*)    All other components within normal limits  COMPREHENSIVE METABOLIC PANEL - Abnormal; Notable for the following components:   BUN 29 (*)    Creatinine, Ser 1.31 (*)    GFR, Estimated 44 (*)    All other components within normal limits    EKG None  Radiology MR LUMBAR SPINE WO CONTRAST  Result Date: 10/22/2021 CLINICAL DATA:  Compression fracture, lumbar EXAM: MRI LUMBAR SPINE WITHOUT CONTRAST TECHNIQUE: Multiplanar, multisequence MR imaging of the lumbar spine was  performed. No intravenous contrast was administered. COMPARISON:  MRI lumbar spine 06/18/2021. FINDINGS: Segmentation:  Standard. Alignment:  Similar grade 1 anterolisthesis of L4 on L5. Vertebrae: Chronic  L5 compression fracture with similar height loss (approximately 40%). No marrow edema. Conus medullaris and cauda equina: Conus extends to the L1-L2 level. Conus appears normal. Paraspinal and other soft tissues: L4-L5 perifacet edema/inflammatory change. Unremarkable. Right renal cyst. Disc levels: T12-L1: No significant disc protrusion, foraminal stenosis, or canal stenosis. L1-L2: No significant disc protrusion, foraminal stenosis, or canal stenosis. L2-L3: Mild disc bulge and mildly prominent dorsal epidural fat. Resulting mild canal and bilateral foraminal stenosis, similar. L3-L4: Mild disc bulge, ligamentum flavum thickening and bilateral facet arthropathy. Mildly prominent dorsal epidural fat. Resulting moderate canal and bilateral foraminal stenosis, similar. L4-L5: Grade 1 anterolisthesis of L4 on L5, similar. Uncovering of the disc with superimposed broad disc bulge, ligamentum flavum thickening and bilateral facet hypertrophy. Resulting severe canal stenosis and severe bilateral subarticular recess narrowing. Similar severe left greater than right foraminal stenosis with left foraminal/extraforaminal disc impinging the exiting/exited left nerve. Small bilateral facet joint effusions with perifacet edema. L5-S1: Mild disc bulge with superimposed central annular fissure, similar. Bilateral facet arthropathy without significant stenosis. IMPRESSION: 1. Chronic L5 compression fracture with similar 40% height loss. 2. At L4-L5, similar severe canal, subarticular recess, and bilateral foraminal stenosis with impingement of the exiting left L4 nerve. 3. At L3-L4, mild canal and bilateral foraminal stenosis, similar. Electronically Signed   By: Feliberto Harts M.D.   On: 10/22/2021 07:26   MR FEMUR LEFT WO  CONTRAST  Result Date: 10/22/2021 CLINICAL DATA:  Upper leg pain EXAM: MR OF THE LEFT FEMUR WITHOUT CONTRAST TECHNIQUE: Multiplanar, multisequence MR imaging of the left femur was performed. No intravenous contrast was administered. COMPARISON:  Radiographs 10/22/2021 FINDINGS: Bones/Joint/Cartilage The left femur is characterized down to the distal metaphyseal level, the knee areas obscured by boundary artifact. No left femoral fracture or periosteal or marrow edema is identified to indicate a stress fracture. There is some mild spurring of the left femoral head and acetabulum. 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 The right hip was not the focus of today's exam. Ligaments N/A Muscles and Tendons Unremarkable Soft tissues Unremarkable IMPRESSION: 1. No findings of fracture or stress fracture involving the left femur down to the level of the distal metaphysis. The knee area is obscured by boundary artifact. 2. Mild degenerative spurring of the left femoral head and acetabulum. 3. 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 Electronically Signed   By: Gaylyn Rong M.D.   On: 10/22/2021 07:29   DG Femur Min 2 Views Left  Result Date: 10/22/2021 CLINICAL DATA:  Left hip pain EXAM: LEFT FEMUR 2 VIEWS COMPARISON:  None. FINDINGS: There is no evidence of fracture or other focal bone lesions. Soft tissues are unremarkable. IMPRESSION: Negative. Electronically Signed   By: Deatra Robinson M.D.   On: 10/22/2021 00:19    Procedures Procedures   Medications Ordered in ED Medications  carvedilol (COREG) tablet 25 mg (25 mg Oral Given 10/22/21 2131)  clopidogrel (PLAVIX) tablet 75 mg (75 mg Oral Given 10/22/21 1011)  diltiazem (CARDIZEM CD) 24 hr capsule 180 mg (180 mg Oral Given 10/22/21 1011)  DULoxetine (CYMBALTA) DR capsule 60 mg (60 mg Oral Given  10/22/21 2132)  folic acid (FOLVITE) tablet 1 mg (1 mg Oral Given 10/22/21 1011)  hydroxychloroquine (PLAQUENIL) tablet 200 mg (200 mg Oral Given 10/22/21 2132)  leflunomide (ARAVA) tablet 20 mg (20 mg Oral Given 10/22/21 1011)  oxyCODONE-acetaminophen (PERCOCET/ROXICET) 5-325 MG per tablet  1 tablet (1 tablet Oral Given 10/22/21 1358)  pantoprazole (PROTONIX) EC tablet 40 mg (has no administration in time range)  rosuvastatin (CRESTOR) tablet 10 mg (10 mg Oral Given 10/22/21 1012)  traZODone (DESYREL) tablet 50 mg (50 mg Oral Given 10/22/21 2132)  oxyCODONE (Oxy IR/ROXICODONE) immediate release tablet 10 mg (10 mg Oral Given 10/22/21 0017)  ketorolac (TORADOL) injection 30 mg (30 mg Intramuscular Given 10/22/21 0018)    ED Course  I have reviewed the triage vital signs and the nursing notes.  Pertinent labs & imaging results that were available during my care of the patient were reviewed by me and considered in my medical decision making (see chart for details).    MDM Rules/Calculators/A&P                         X-ray femur is negative.  This very well could be ongoing muscular pain however prior to sending her home it is probably prudent to do an MRI to evaluate for occult fracture.  If this is negative I think the patient is able to be discharged with PT OT and home health as needed.  Will await MRI for further management.  MRI without acute findings. Does have some nerve impingement but is stable from august. No occult fractures. Will engage TOC/PT eval and treat for further recommendations. Patietn is ok with SNF/rehab or d/c home with resources, whichever is most appropriate.    Final Clinical Impression(s) / ED Diagnoses Final diagnoses:  None    Rx / DC Orders ED Discharge Orders     None        Jameya Pontiff, Corene Cornea, MD 10/23/21 0300

## 2021-10-22 ENCOUNTER — Emergency Department (HOSPITAL_COMMUNITY): Payer: Medicare Other

## 2021-10-22 LAB — CBC WITH DIFFERENTIAL/PLATELET
Abs Immature Granulocytes: 0 10*3/uL (ref 0.00–0.07)
Basophils Absolute: 0 10*3/uL (ref 0.0–0.1)
Basophils Relative: 1 %
Eosinophils Absolute: 0.1 10*3/uL (ref 0.0–0.5)
Eosinophils Relative: 3 %
HCT: 36.1 % (ref 36.0–46.0)
Hemoglobin: 11.6 g/dL — ABNORMAL LOW (ref 12.0–15.0)
Immature Granulocytes: 0 %
Lymphocytes Relative: 26 %
Lymphs Abs: 1.4 10*3/uL (ref 0.7–4.0)
MCH: 30.5 pg (ref 26.0–34.0)
MCHC: 32.1 g/dL (ref 30.0–36.0)
MCV: 95 fL (ref 80.0–100.0)
Monocytes Absolute: 0.7 10*3/uL (ref 0.1–1.0)
Monocytes Relative: 12 %
Neutro Abs: 3.1 10*3/uL (ref 1.7–7.7)
Neutrophils Relative %: 58 %
Platelets: 134 10*3/uL — ABNORMAL LOW (ref 150–400)
RBC: 3.8 MIL/uL — ABNORMAL LOW (ref 3.87–5.11)
RDW: 14.6 % (ref 11.5–15.5)
WBC: 5.3 10*3/uL (ref 4.0–10.5)
nRBC: 0 % (ref 0.0–0.2)

## 2021-10-22 LAB — COMPREHENSIVE METABOLIC PANEL
ALT: 12 U/L (ref 0–44)
AST: 17 U/L (ref 15–41)
Albumin: 3.7 g/dL (ref 3.5–5.0)
Alkaline Phosphatase: 80 U/L (ref 38–126)
Anion gap: 9 (ref 5–15)
BUN: 29 mg/dL — ABNORMAL HIGH (ref 8–23)
CO2: 22 mmol/L (ref 22–32)
Calcium: 8.9 mg/dL (ref 8.9–10.3)
Chloride: 107 mmol/L (ref 98–111)
Creatinine, Ser: 1.31 mg/dL — ABNORMAL HIGH (ref 0.44–1.00)
GFR, Estimated: 44 mL/min — ABNORMAL LOW (ref >60)
Glucose, Bld: 84 mg/dL (ref 70–99)
Potassium: 4.2 mmol/L (ref 3.5–5.1)
Sodium: 138 mmol/L (ref 135–145)
Total Bilirubin: 0.8 mg/dL (ref 0.3–1.2)
Total Protein: 7 g/dL (ref 6.5–8.1)

## 2021-10-22 MED ORDER — CLOPIDOGREL BISULFATE 75 MG PO TABS
75.0000 mg | ORAL_TABLET | Freq: Every day | ORAL | Status: DC
  Administered 2021-10-22 – 2021-10-23 (×2): 75 mg via ORAL
  Filled 2021-10-22 (×2): qty 1

## 2021-10-22 MED ORDER — OXYCODONE HCL 5 MG PO TABS
10.0000 mg | ORAL_TABLET | Freq: Once | ORAL | Status: AC
  Administered 2021-10-22: 10 mg via ORAL
  Filled 2021-10-22: qty 2

## 2021-10-22 MED ORDER — CARVEDILOL 12.5 MG PO TABS
25.0000 mg | ORAL_TABLET | Freq: Two times a day (BID) | ORAL | Status: DC
  Administered 2021-10-22 – 2021-10-23 (×3): 25 mg via ORAL
  Filled 2021-10-22 (×3): qty 2

## 2021-10-22 MED ORDER — TRAZODONE HCL 100 MG PO TABS
50.0000 mg | ORAL_TABLET | Freq: Every evening | ORAL | Status: DC | PRN
  Administered 2021-10-22: 22:00:00 50 mg via ORAL
  Filled 2021-10-22: qty 1

## 2021-10-22 MED ORDER — HYDROXYCHLOROQUINE SULFATE 200 MG PO TABS
200.0000 mg | ORAL_TABLET | Freq: Two times a day (BID) | ORAL | Status: DC
  Administered 2021-10-22 – 2021-10-23 (×3): 200 mg via ORAL
  Filled 2021-10-22 (×3): qty 1

## 2021-10-22 MED ORDER — FOLIC ACID 1 MG PO TABS
1.0000 mg | ORAL_TABLET | Freq: Every day | ORAL | Status: DC
  Administered 2021-10-22 – 2021-10-23 (×2): 1 mg via ORAL
  Filled 2021-10-22 (×2): qty 1

## 2021-10-22 MED ORDER — DILTIAZEM HCL ER COATED BEADS 180 MG PO CP24
180.0000 mg | ORAL_CAPSULE | Freq: Every day | ORAL | Status: DC
  Administered 2021-10-22 – 2021-10-23 (×2): 180 mg via ORAL
  Filled 2021-10-22 (×2): qty 1

## 2021-10-22 MED ORDER — LEFLUNOMIDE 20 MG PO TABS
20.0000 mg | ORAL_TABLET | Freq: Every day | ORAL | Status: DC
  Administered 2021-10-22 – 2021-10-23 (×2): 20 mg via ORAL
  Filled 2021-10-22 (×2): qty 1

## 2021-10-22 MED ORDER — DULOXETINE HCL 30 MG PO CPEP
60.0000 mg | ORAL_CAPSULE | Freq: Every day | ORAL | Status: DC
  Administered 2021-10-22: 22:00:00 60 mg via ORAL
  Filled 2021-10-22: qty 2

## 2021-10-22 MED ORDER — KETOROLAC TROMETHAMINE 60 MG/2ML IM SOLN
30.0000 mg | Freq: Once | INTRAMUSCULAR | Status: AC
  Administered 2021-10-22: 30 mg via INTRAMUSCULAR
  Filled 2021-10-22: qty 2

## 2021-10-22 MED ORDER — PANTOPRAZOLE SODIUM 40 MG PO TBEC: 40.0000 mg | DELAYED_RELEASE_TABLET | Freq: Every day | ORAL | Status: DC | PRN

## 2021-10-22 MED ORDER — ROSUVASTATIN CALCIUM 10 MG PO TABS
10.0000 mg | ORAL_TABLET | Freq: Every day | ORAL | Status: DC
  Administered 2021-10-22 – 2021-10-23 (×2): 10 mg via ORAL
  Filled 2021-10-22 (×2): qty 1

## 2021-10-22 MED ORDER — OXYCODONE-ACETAMINOPHEN 5-325 MG PO TABS
1.0000 | ORAL_TABLET | Freq: Two times a day (BID) | ORAL | Status: DC | PRN
  Administered 2021-10-22 – 2021-10-23 (×2): 1 via ORAL
  Filled 2021-10-22 (×2): qty 1

## 2021-10-22 NOTE — Progress Notes (Signed)
.  Transition of Care [facility]) - Emergency Department Mini Assessment   Patient Details  Name: Michelle Graves MRN: [id number] Date of Birth: [birth year]  Transition of [facility]) CM/SW Contact:    Larrie Kass, LCSW Phone Number: 10/22/2021, 12:20 PM   Clinical Narrative:  TOC CSW spoke with at bedside about PT recommendations, pt stated she lives alone. Pt stated she woke up on the 24 th unable to walk , she stated she didn't know what happened. Pt stated " nobody wants to help me". Pt stated she was admitted to [facility] in the past, and does not want to go back. Pt stated her last fall was in oct. Pt agreed with recommendation for short term rehab. CSW to work pt up for SNF.   ED Mini Assessment: What brought you to the Emergency Department? : hip pain  Barriers to Discharge: Continued Medical Work up        Interventions which prevented an admission or readmission: SNF Placement    Patient Contact and Communications        ,            CMS Medicare.gov Compare Post Acute Care list provided to:: Patient Choice offered to / list presented to : Patient  Admission diagnosis:  Hip Pain Patient Active Problem List   Diagnosis Date Noted   Acute alteration in mental status    Uncontrolled hypertension    Altered mental status 03/19/2021   Multinodular goiter 02/25/2021   Acute metabolic encephalopathy 01/21/2021   Hypertensive urgency 01/21/2021   Sepsis secondary to UTI (HCC) 01/21/2021   Adrenal insufficiency (HCC) 12/18/2020   Encephalopathy 12/17/2020   TIA (transient ischemic attack) 12/15/2020   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   Bowel obstruction (HCC) 05/06/2020   SIRS (systemic inflammatory response syndrome) (HCC) 05/06/2020   UTI (urinary tract infection) 11/08/2019   Major depressive disorder, recurrent episode, moderate (HCC) 11/08/2019   Urinary retention 10/28/2019   Acute on chronic kidney failure (HCC) 10/27/2019   Malnutrition  of moderate degree 10/03/2019   Colitis 09/30/2019   AKI (acute kidney injury) (HCC) 05/28/2019   Nausea & vomiting 05/28/2019   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   Anemia, chronic disease 05/28/2019   Swelling    Small vessel disease, cerebrovascular    Acute ischemic stroke (HCC)    Weakness    Acute left ankle pain    Acute on chronic renal failure (HCC) 05/13/2019   Weakness of both lower extremities 05/13/2019   Cerebral thrombosis with cerebral infarction 03/31/2019   New onset a-fib (HCC) 03/27/2019   Acute kidney injury superimposed on CKD (HCC) 03/24/2019   Hypotension 03/24/2019   Hematemesis 03/24/2019   Dark stools 03/24/2019   Diverticulitis of intestine without perforation or abscess without bleeding    Sinus tachycardia 08/27/2016   Diverticulitis 08/26/2016   HYPERTENSION, MALIGNANT ESSENTIAL 06/24/2007   CKD (chronic kidney disease), stage III (HCC) 06/24/2007   Rheumatoid arthritis (HCC) 06/24/2007   PCP:  [facility]:   [id number] [name], [place] - [address] AT [facility] OF [address] [name] [address] Phone: [phone] Fax: [phone]  [facility] - [facility] [place], [place] - [address] Laure[place]s [address] [place]ure[place] [address] [place] [address] Phone: [phone] Fax: [phone]

## 2021-10-22 NOTE — Evaluation (Signed)
Physical Therapy Evaluation Patient Details Name: Michelle Graves MRN: [id number] DOB: 1951-07-21 Today's Date: 10/22/2021  History of Present Illness  70 yo femalwe presents  to Ed from home with complaints of Left hip pain, inability to ambulate. Multiple trips to ED in recent past for fall and pain, recent R medial meniscus tear, treated with KI.  PMH :Acute on chronic kidney failure   Anemia     Arthritis     Chest pain     Diverticulitis     Hypertension     Stroke     UTI.  Lumbar MR indicates:1. Chronic L5 compression fracture with similar 40% height loss. 2. 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 At L3-L4, mild canal and bilateral foraminal stenosis, similar.    Clinical Impression  The patient is noted to have many visits to ED due to pain.  Patient reports  Pain in the   Left leg. Patient describes  "shooting" Pain down Left thigh when attempting to sit up.PT wonders if this is due to impingement noted  at left L4 nerve per MR.  Patient currently quite limited in function at this time. Patient lives alone.  Pt admitted with above diagnosis.   Pt currently with functional limitations due to the deficits listed below (see PT Problem List). Pt will benefit from skilled PT to increase their independence and safety with mobility to allow discharge to the venue listed below.        Recommendations for follow up therapy are one component of a multi-disciplinary discharge planning process, led by the attending physician.  Recommendations may be updated based on patient status, additional functional criteria and insurance authorization.  Follow Up Recommendations Skilled nursing-short term rehab (<3 hours/day)    Assistance Recommended at Discharge Frequent or constant Supervision/Assistance  Functional Status Assessment Patient has had a recent decline in their functional status and demonstrates  the ability to make significant improvements in function in a reasonable and predictable amount of time.  Equipment Recommendations  None recommended by PT    Recommendations for Other Services       Precautions / Restrictions Precautions Precautions: Fall Precaution Comments: recent medial meniscus  tear on R      Mobility  Bed Mobility Overal bed mobility: Needs Assistance Bed Mobility: Rolling;Sidelying to Sit Rolling: Min assist Sidelying to sit: Max assist       General bed mobility comments: patient rolls slowly to the right, Assisted to sit from sidelying, unable to  fuklly sit upright with max assistance, attempted multiple times, never able to fully sit upright due to pain.    Transfers                   General transfer comment: unable    Ambulation/Gait                  Stairs            Wheelchair Mobility    Modified Rankin (Stroke Patients Only)       Balance                                             Pertinent Vitals/Pain Pain Assessment: Faces Faces Pain Scale: Hurts whole lot Pain Location: left hip, "shooting down" to lateral knee. Pain Descriptors /  Indicators: Radiating Pain Intervention(s): Limited activity within patient's tolerance;Monitored during session    [place] expects to be discharged to:: Private residence Living Arrangements: Alone Available Help at Discharge: Family;Available PRN/intermittently Type of Home: Apartment Home Access: Elevator       Home Layout: One level Home Equipment: Conservation officer, nature (2 wheels);Rollator (4 wheels);BSC/3in1      Prior Function Prior Level of Function : Needs assist       Physical Assist : Mobility (physical);ADLs (physical) Mobility (physical): Bed mobility;Transfers   Mobility Comments: 2 days prior to admission patient required assistance from family to ambulate to BR. Unable to get up 12/26, no family available.        Hand Dominance   Dominant Hand: Left    Extremity/Trunk Assessment   Upper Extremity Assessment Upper Extremity Assessment: Overall WFL for tasks assessed    Lower Extremity Assessment Lower Extremity Assessment: RLE deficits/detail;LLE deficits/detail RLE Deficits / Details: decreased knee flexion with  pain reports. LLE Deficits / Details: decreased Knee and hip flexion tolerated while attempting to sit up LLE: Unable to fully assess due to pain    Cervical / Trunk Assessment Cervical / Trunk Assessment: Other exceptions Cervical / Trunk Exceptions: patient did not sit upright on  edge due to reports of increased LLE pain.  Communication   Communication: No difficulties  Cognition Arousal/Alertness: Awake/alert Behavior During Therapy: WFL for tasks assessed/performed Overall Cognitive Status: Within Functional Limits for tasks assessed                                          General Comments      Exercises     Assessment/Plan    PT Assessment Patient needs continued PT services  PT Problem List Decreased mobility;Decreased range of motion;Decreased knowledge of precautions;Decreased balance;Pain;Decreased activity tolerance       PT Treatment Interventions DME instruction;Therapeutic activities;Gait training;Therapeutic exercise;Patient/family education;Functional mobility training    PT Goals (Current goals can be found in the Care Plan section)  Acute Rehab PT Goals Patient Stated Goal: to not have pain PT Goal Formulation: With patient Time For Goal Achievement: 11/05/21 Potential to Achieve Goals: Fair    Frequency Min 2X/week   Barriers to discharge        Co-evaluation               AM-PAC PT "6 Clicks" Mobility  Outcome Measure Help needed turning from your back to your side while in a flat bed without using bedrails?: Total Help needed moving from lying on your back to sitting on the side of a flat bed without using  bedrails?: Total Help needed moving to and from a bed to a chair (including a wheelchair)?: Total Help needed standing up from a chair using your arms (e.g., wheelchair or bedside chair)?: Total Help needed to walk in hospital room?: Total Help needed climbing 3-5 steps with a railing? : Total 6 Click Score: 6    End of Session   Activity Tolerance: Patient limited by pain Patient left: in bed;with call bell/phone within reach Nurse Communication: Mobility status PT Visit Diagnosis: Unsteadiness on feet (R26.81);Difficulty in walking, not elsewhere classified (R26.2);Pain;Repeated falls (R29.6) Pain - Right/Left: Left Pain - part of body: Hip;Leg    Time: [id number] PT Time Calculation (min) (ACUTE ONLY): 24 min   Charges:   PT Evaluation $PT Eval Low Complexity: 1 Low  PT Treatments $Therapeutic Activity: 8-22 mins        Tresa Endo PT Acute Rehabilitation Services Pager [phone] Office [phone]   Claretha Cooper 10/22/2021, 9:45 AM

## 2021-10-22 NOTE — TOC Progression Note (Signed)
Transition of Care [facility]) - Progression Note    Patient Details  Name: Michelle Graves MRN: [id number] Date of Birth: 06-Feb-1951  Transition of Care [facility]) CM/SW Contact  Lavenia Atlas, RN Phone Number: 10/22/2021, 3:59 PM  Clinical Narrative:    RNCM spoke with patient at bedside. Patient was offered a choice and has selected bed offer from [facility] at [address] [place] [address]. Patient has accepted and prior authorization will be requested. Upon completion of prior auth   Patient requested this RNCM notify her daughter.   3:54p- RNCM made outbound call to patient's daughter to explain SNF process. Daughter states noone has contacted her. RNCM advised daughter to contact her mother to discuss further. TOC team will continue to follow.    Barriers to Discharge: Continued Medical Work up  Expected Discharge Plan and Services    Discharge to SNF      Social Determinants of Health (SDOH) Interventions    Readmission Risk Interventions Readmission Risk Prevention Plan 05/10/2020 05/10/2020 10/28/2019  Transportation Screening - Complete Complete  PCP or Specialist Appt within 3-5 Days - - -  Not Complete comments - - -  HRI or Home Care Consult - - -  HRI or Home Care Consult comments - - -  Social Work Consult for Recovery Care Planning/Counseling - - -  Palliative Care Screening - - -  Medication Review [facility]) - Referral to Pharmacy Referral to Pharmacy  PCP or Specialist appointment within 3-5 days of discharge Complete Complete Not Complete  PCP/Specialist Appt Not Complete comments - - due to holiday, pt to make appointment  HRI or Home Care Consult - Complete Complete  SW Recovery Care/Counseling Consult - - Complete  Palliative Care Screening - Not Applicable Not Applicable  Skilled Nursing Facility - Patient Refused Complete  Some recent data might be hidden

## 2021-10-22 NOTE — TOC Progression Note (Signed)
Transition of Care [facility]) - Progression Note    Patient Details  Name: Michelle Graves MRN: [id number] Date of Birth: [birth year]  Transition of Care [facility]) CM/SW Contact  Carmina Miller, [place] Phone Number: 10/22/2021, 4:29 PM  Clinical Narrative:    CSW started [place], ref number [id number] for [facility]. Per [facility] auth may not be approved until tomorrow due to the time of day. CSW willl update MD if Berkley Harvey is provided this evening.      Barriers to Discharge: Continued Medical Work up  Expected Discharge Plan and Services                                                 Social Determinants of Health (SDOH) Interventions    Readmission Risk Interventions Readmission Risk Prevention Plan 05/10/2020 05/10/2020 10/28/2019  Transportation Screening - Complete Complete  PCP or Specialist Appt within 3-5 Days - - -  Not Complete comments - - -  HRI or Home Care Consult - - -  HRI or Home Care Consult comments - - -  Social Work Consult for Recovery Care Planning/Counseling - - -  Palliative Care Screening - - -  Medication Review Oceanographer) - Referral to Pharmacy Referral to Pharmacy  PCP or Specialist appointment within 3-5 days of discharge Complete Complete Not Complete  PCP/Specialist Appt Not Complete comments - - due to holiday, pt to make appointment  HRI or Home Care Consult - Complete Complete  SW Recovery Care/Counseling Consult - - Complete  Palliative Care Screening - Not Applicable Not Applicable  Skilled Nursing Facility - Patient Refused Complete  Some recent data might be hidden

## 2021-10-22 NOTE — NC FL2 (Signed)
[place] MEDICAID FL2 LEVEL OF CARE SCREENING TOOL     IDENTIFICATION  Patient Name: Michelle Graves Birthdate: [birth year] Sex: female Admission Date (Current Location): 10/21/2021  [facility] and [place] Number:  Producer, television/film/video and Address:  [facility],  [address] [place], [place] [address]      Provider Number: [id number]  Attending Physician Name and Address:  Gwyneth Sprout, MD  Relative Name and Phone Number:  [name] Daughter [phone]    Current Level of Care: Hospital Recommended Level of Care: Skilled Nursing Facility Prior Approval Number:    Date Approved/Denied:   PASRR Number: [id number]  Discharge Plan: SNF    Current Diagnoses: Patient Active Problem List   Diagnosis Date Noted   Acute alteration in mental status    Uncontrolled hypertension    Altered mental status 03/19/2021   Multinodular goiter 02/25/2021   Acute metabolic encephalopathy 01/21/2021   Hypertensive urgency 01/21/2021   Sepsis secondary to UTI (HCC) 01/21/2021   Adrenal insufficiency (HCC) 12/18/2020   Encephalopathy 12/17/2020   TIA (transient ischemic attack) 12/15/2020   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   Bowel obstruction (HCC) 05/06/2020   SIRS (systemic inflammatory response syndrome) (HCC) 05/06/2020   UTI (urinary tract infection) 11/08/2019   Major depressive disorder, recurrent episode, moderate (HCC) 11/08/2019   Urinary retention 10/28/2019   Acute on chronic kidney failure (HCC) 10/27/2019   Malnutrition of moderate degree 10/03/2019   Colitis 09/30/2019   AKI (acute kidney injury) (HCC) 05/28/2019   Nausea & vomiting 05/28/2019   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   Anemia, chronic disease 05/28/2019   Swelling    Small vessel disease, cerebrovascular    Acute ischemic stroke (HCC)    Weakness    Acute left ankle pain    Acute on chronic renal failure (HCC) 05/13/2019   Weakness of both lower extremities 05/13/2019   Cerebral  thrombosis with cerebral infarction 03/31/2019   New onset a-fib (HCC) 03/27/2019   Acute kidney injury superimposed on CKD (HCC) 03/24/2019   Hypotension 03/24/2019   Hematemesis 03/24/2019   Dark stools 03/24/2019   Diverticulitis of intestine without perforation or abscess without bleeding    Sinus tachycardia 08/27/2016   Diverticulitis 08/26/2016   HYPERTENSION, MALIGNANT ESSENTIAL 06/24/2007   CKD (chronic kidney disease), stage III (HCC) 06/24/2007   Rheumatoid arthritis (HCC) 06/24/2007    Orientation RESPIRATION BLADDER Height & Weight     Time, Self, Situation, Place  Normal Incontinent Weight:   Height:     BEHAVIORAL SYMPTOMS/MOOD NEUROLOGICAL BOWEL NUTRITION STATUS      Continent Diet (regular)  AMBULATORY STATUS COMMUNICATION OF NEEDS Skin   Limited Assist Verbally Normal                       Personal Care Assistance Level of Assistance  Bathing, Feeding, Dressing Bathing Assistance: Maximum assistance Feeding assistance: Independent Dressing Assistance: Maximum assistance     Functional Limitations Info  Sight, Hearing, Speech Sight Info: Adequate Hearing Info: Adequate Speech Info: Adequate    SPECIAL CARE FACTORS FREQUENCY  PT (By licensed PT), OT (By licensed OT)     PT Frequency: 5 x a week OT Frequency: 5 x a week            Contractures Contractures Info: Not present    Additional Factors Info  Code Status, Allergies, Psychotropic Code Status Info: full Allergies Info: Atorvastatin Psychotropic Info: traZODone (DESYREL) tablet 50 mg  Current Medications (10/22/2021):  This is the current hospital active medication list Current Facility-Administered Medications  Medication Dose Route Frequency Provider Last Rate Last Admin   carvedilol (COREG) tablet 25 mg  25 mg Oral BID Mesner, Corene Cornea, MD   25 mg at 10/22/21 1010   clopidogrel (PLAVIX) tablet 75 mg  75 mg Oral Daily Mesner, Corene Cornea, MD   75 mg at 10/22/21 1011    diltiazem (CARDIZEM CD) 24 hr capsule 180 mg  180 mg Oral Daily Mesner, Corene Cornea, MD   180 mg at 10/22/21 1011   DULoxetine (CYMBALTA) DR capsule 60 mg  60 mg Oral QHS Mesner, Corene Cornea, MD       folic acid (FOLVITE) tablet 1 mg  1 mg Oral Daily Mesner, Corene Cornea, MD   1 mg at 10/22/21 1011   hydroxychloroquine (PLAQUENIL) tablet 200 mg  200 mg Oral BID Mesner, Corene Cornea, MD   200 mg at 10/22/21 1011   leflunomide (ARAVA) tablet 20 mg  20 mg Oral Daily Mesner, Corene Cornea, MD   20 mg at 10/22/21 1011   oxyCODONE-acetaminophen (PERCOCET/ROXICET) 5-325 MG per tablet 1 tablet  1 tablet Oral BID PRN Mesner, Corene Cornea, MD   1 tablet at 10/22/21 1358   pantoprazole (PROTONIX) EC tablet 40 mg  40 mg Oral Daily PRN Mesner, Corene Cornea, MD       rosuvastatin (CRESTOR) tablet 10 mg  10 mg Oral Daily Mesner, Corene Cornea, MD   10 mg at 10/22/21 1012   traZODone (DESYREL) tablet 50 mg  50 mg Oral QHS PRN Mesner, Corene Cornea, MD       Current Outpatient Medications  Medication Sig Dispense Refill   carvedilol (COREG) 25 MG tablet Take 1 tablet (25 mg total) by mouth 2 (two) times daily. 60 tablet 0   clopidogrel (PLAVIX) 75 MG tablet Take 1 tablet (75 mg total) by mouth daily. 30 tablet 11   diclofenac Sodium (VOLTAREN) 1 % GEL Apply 1 application topically daily as needed (for muscle or joint pain).     diltiazem (CARDIZEM CD) 180 MG 24 hr capsule Take 180 mg by mouth daily.     DULoxetine (CYMBALTA) 60 MG capsule Take 1 capsule (60 mg total) by mouth at bedtime. 30 capsule 0   folic acid (FOLVITE) 1 MG tablet Take 1 mg by mouth daily.     hydroxychloroquine (PLAQUENIL) 200 MG tablet Take 1 tablet (200 mg total) by mouth daily. (Patient taking differently: Take 200 mg by mouth 2 (two) times daily.) 30 tablet 0   leflunomide (ARAVA) 20 MG tablet Take 20 mg by mouth daily.     Multiple Vitamins-Minerals (CENTRUM WOMEN) TABS Take 1 tablet by mouth daily.     oxyCODONE-acetaminophen (PERCOCET/ROXICET) 5-325 MG tablet Take 1 tablet by mouth 2 (two) times  daily as needed for moderate pain.     pantoprazole (PROTONIX) 40 MG tablet Take 1 tablet (40 mg total) by mouth daily. (Patient taking differently: Take 40 mg by mouth daily as needed (for acid reflux).) 30 tablet 0   rosuvastatin (CRESTOR) 10 MG tablet Take 10 mg by mouth daily.     traZODone (DESYREL) 50 MG tablet Take 50 mg by mouth at bedtime as needed for sleep.       Discharge Medications: Please see discharge summary for a list of discharge medications.  Relevant Imaging Results:  Relevant Lab Results:   Additional Information SSN- [id number]   [place], LCSW

## 2021-10-23 LAB — RESP PANEL BY RT-PCR (FLU A&B, COVID) ARPGX2
Influenza A by PCR: NEGATIVE
Influenza B by PCR: NEGATIVE
SARS Coronavirus 2 by RT PCR: NEGATIVE

## 2021-10-23 MED ORDER — OXYCODONE-ACETAMINOPHEN 7.5-325 MG PO TABS: 1.0000 | ORAL_TABLET | Freq: Four times a day (QID) | ORAL | 0 refills | Status: AC | PRN

## 2021-10-23 MED ORDER — OXYCODONE-ACETAMINOPHEN 7.5-325 MG PO TABS
1.0000 | ORAL_TABLET | Freq: Four times a day (QID) | ORAL | Status: DC | PRN
  Administered 2021-10-23: 12:00:00 1 via ORAL
  Filled 2021-10-23: qty 1

## 2021-10-23 MED ORDER — OXYCODONE-ACETAMINOPHEN 7.5-325 MG PO TABS: 1.0000 | ORAL_TABLET | Freq: Four times a day (QID) | ORAL | 0 refills | Status: DC | PRN

## 2021-10-23 NOTE — ED Notes (Signed)
PTAR here.  Pt loaded onto stretcher and transported to [facility] via [place] staff. Pt vitally stable and in NAD at this time.  Pt's belongings sent w/ pt & PTAR staff to [facility].  Pt's phone zipped into pt's jacket pocket.

## 2021-10-23 NOTE — ED Notes (Signed)
Pt to be transferred to [facility] via stretcher w/ PTAR.  Pt vitally stable and in NAD at this time.  Report called to Olegario Messier, RN @ 1130 at [facility].  Olegario Messier states understanding of pt and all questions answered. Awaiting PTAR's arrival to transport pt to [facility]

## 2021-10-23 NOTE — Progress Notes (Signed)
Pt room number is #103, call report [phone].  Valentina Shaggy.Piero Mustard, MSW, LCSWA [facility]   Transitions of Care Clinical Social Worker I Direct Dial: [phone]   Fax: [phone] [email or web address]

## 2021-10-23 NOTE — Progress Notes (Signed)
Pt's insurance Auth approved. Pt can d/c to [facility] pending neg COVID test. MD and RN made aware.  Valentina Shaggy.Agatha Duplechain, MSW, LCSWA [facility]   Transitions of Care Clinical Social Worker I Direct Dial: [phone]   Fax: [phone] [email or web address]

## 2021-10-23 NOTE — ED Notes (Signed)
PTAR dispatch called, pt still on active list.   Awaiting PTAR transport. Pt vitally stable and in NAD.

## 2021-10-23 NOTE — ED Provider Notes (Signed)
Patient is awaiting placement for left hip and leg pain causing gait failure with failure of ADLs at home. Physical Exam  BP (!) 164/94    Pulse 69    Temp 98.4 F (36.9 C) (Oral)    Resp 18    SpO2 98%   Physical Exam Patient is alert.  She is resting quietly.  No respiratory distress. Heart regular no rub murmur gallop Lungs clear to auscultation. ED Course/Procedures     Procedures  MDM   Patient has been accepted to [facility] health care.  At this time she is awaiting transport.  I have adjusted patient's pain medication to try to improve pain control.  Adjusted to 7.5 Percocet every 6 hours as needed.      Arby Barrette, MD 10/23/21 [id number]

## 2021-11-24 ENCOUNTER — Emergency Department (HOSPITAL_COMMUNITY): Payer: Medicare PPO

## 2021-11-24 ENCOUNTER — Emergency Department (HOSPITAL_COMMUNITY)
Admission: EM | Admit: 2021-11-24 | Discharge: 2021-11-24 | Disposition: A | Discharge status: Home / Self Care | Payer: Medicare PPO | Source: Home / Self Care | Attending: Emergency Medicine | Admitting: Emergency Medicine

## 2021-11-24 DIAGNOSIS — M79652 Pain in left thigh: Secondary | ICD-10-CM | POA: Insufficient documentation

## 2021-11-24 DIAGNOSIS — W19XXXA Unspecified fall, initial encounter: Secondary | ICD-10-CM

## 2021-11-24 DIAGNOSIS — M25552 Pain in left hip: Secondary | ICD-10-CM | POA: Insufficient documentation

## 2021-11-24 DIAGNOSIS — Z7902 Long term (current) use of antithrombotics/antiplatelets: Secondary | ICD-10-CM | POA: Insufficient documentation

## 2021-11-24 DIAGNOSIS — I63512 Cerebral infarction due to unspecified occlusion or stenosis of left middle cerebral artery: Secondary | ICD-10-CM | POA: Diagnosis not present

## 2021-11-24 DIAGNOSIS — W06XXXA Fall from bed, initial encounter: Secondary | ICD-10-CM | POA: Insufficient documentation

## 2021-11-24 LAB — CBC WITH DIFFERENTIAL/PLATELET
Abs Immature Granulocytes: 0.05 10*3/uL (ref 0.00–0.07)
Basophils Absolute: 0 10*3/uL (ref 0.0–0.1)
Basophils Relative: 0 %
Eosinophils Absolute: 0.3 10*3/uL (ref 0.0–0.5)
Eosinophils Relative: 4 %
HCT: 32.1 % — ABNORMAL LOW (ref 36.0–46.0)
Hemoglobin: 10.5 g/dL — ABNORMAL LOW (ref 12.0–15.0)
Immature Granulocytes: 1 %
Lymphocytes Relative: 24 %
Lymphs Abs: 1.9 10*3/uL (ref 0.7–4.0)
MCH: 31.1 pg (ref 26.0–34.0)
MCHC: 32.7 g/dL (ref 30.0–36.0)
MCV: 95 fL (ref 80.0–100.0)
Monocytes Absolute: 0.7 10*3/uL (ref 0.1–1.0)
Monocytes Relative: 9 %
Neutro Abs: 4.9 10*3/uL (ref 1.7–7.7)
Neutrophils Relative %: 62 %
Platelets: 181 10*3/uL (ref 150–400)
RBC: 3.38 MIL/uL — ABNORMAL LOW (ref 3.87–5.11)
RDW: 15 % (ref 11.5–15.5)
WBC: 8 10*3/uL (ref 4.0–10.5)
nRBC: 0 % (ref 0.0–0.2)

## 2021-11-24 LAB — BASIC METABOLIC PANEL
Anion gap: 8 (ref 5–15)
BUN: 46 mg/dL — ABNORMAL HIGH (ref 8–23)
CO2: 23 mmol/L (ref 22–32)
Calcium: 8.5 mg/dL — ABNORMAL LOW (ref 8.9–10.3)
Chloride: 107 mmol/L (ref 98–111)
Creatinine, Ser: 1.65 mg/dL — ABNORMAL HIGH (ref 0.44–1.00)
GFR, Estimated: 34 mL/min — ABNORMAL LOW (ref >60)
Glucose, Bld: 140 mg/dL — ABNORMAL HIGH (ref 70–99)
Potassium: 5 mmol/L (ref 3.5–5.1)
Sodium: 138 mmol/L (ref 135–145)

## 2021-11-24 NOTE — ED Provider Notes (Signed)
[facility] DEPT Provider Note   CSN: [id number] Arrival date & time: 11/24/21  1913     History  Chief Complaint  Patient presents with   Lytle Michaels    Michelle Graves is a 71 y.o. female.  71 year old female with prior medical history as detailed below presents for evaluation.  Patient reports that she fell today.  Patient reports that she slid out of her bed.  She complains of pain to the left hip and left thigh.  She reports that she typically uses a walker for assistance while ambulating.  Patient denies other injury.  She denies head injury.  She denies LOC.  She reports multiple frequent falls over the course of the last several days.  The history is provided by the patient and medical records.  Fall This is a new problem. The current episode started 1 to 2 hours ago. The problem occurs daily. The problem has not changed since onset.Nothing aggravates the symptoms. Nothing relieves the symptoms.      Home Medications Prior to Admission medications   Medication Sig Start Date End Date Taking? Authorizing Provider  carvedilol (COREG) 25 MG tablet Take 1 tablet (25 mg total) by mouth 2 (two) times daily. 01/03/21   Fargo, Amy E, NP  clopidogrel (PLAVIX) 75 MG tablet Take 1 tablet (75 mg total) by mouth daily. 03/12/21   Garvin Fila, MD  diclofenac Sodium (VOLTAREN) 1 % GEL Apply 1 application topically daily as needed (for muscle or joint pain). 09/17/21   [provider]  diltiazem (CARDIZEM CD) 180 MG 24 hr capsule Take 180 mg by mouth daily. 04/01/21   [provider]  DULoxetine (CYMBALTA) 60 MG capsule Take 1 capsule (60 mg total) by mouth at bedtime. 01/03/21   Fargo, Amy E, NP  folic acid (FOLVITE) 1 MG tablet Take 1 mg by mouth daily. 10/11/21   [provider]  hydroxychloroquine (PLAQUENIL) 200 MG tablet Take 1 tablet (200 mg total) by mouth daily. Patient taking differently: Take 200 mg by mouth 2 (two) times  daily. 01/03/21   Fargo, Amy E, NP  leflunomide (ARAVA) 20 MG tablet Take 20 mg by mouth daily. 05/27/21   [provider]  Multiple Vitamins-Minerals (CENTRUM WOMEN) TABS Take 1 tablet by mouth daily.    [provider]  oxyCODONE-acetaminophen (PERCOCET) 7.5-325 MG tablet Take 1 tablet by mouth every 6 (six) hours as needed for severe pain or moderate pain. 10/23/21   Charlesetta Shanks, MD  oxyCODONE-acetaminophen (PERCOCET/ROXICET) 5-325 MG tablet Take 1 tablet by mouth 2 (two) times daily as needed for moderate pain. 10/03/21   [provider]  pantoprazole (PROTONIX) 40 MG tablet Take 1 tablet (40 mg total) by mouth daily. Patient taking differently: Take 40 mg by mouth daily as needed (for acid reflux). 01/03/21   Fargo, Amy E, NP  rosuvastatin (CRESTOR) 10 MG tablet Take 10 mg by mouth daily.    [provider]  traZODone (DESYREL) 50 MG tablet Take 50 mg by mouth at bedtime as needed for sleep. 10/08/21   [provider]      Allergies    Atorvastatin    Review of Systems   Review of Systems  All other systems reviewed and are negative.  Physical Exam Updated Vital Signs BP 107/85 (BP Location: Right Arm)    Pulse 83    Temp 98.7 F (37.1 C) (Oral)    Resp 12    SpO2 100%  Physical Exam  Vitals and nursing note reviewed.  Constitutional:      General: She is not in acute distress.    Appearance: Normal appearance. She is well-developed.  HENT:     Head: Normocephalic and atraumatic.  Eyes:     Conjunctiva/sclera: Conjunctivae normal.     Pupils: Pupils are equal, round, and reactive to light.  Cardiovascular:     Rate and Rhythm: Normal rate and regular rhythm.     Heart sounds: Normal heart sounds.  Pulmonary:     Effort: Pulmonary effort is normal. No respiratory distress.     Breath sounds: Normal breath sounds.  Abdominal:     General: There is no distension.     Palpations: Abdomen is soft.     Tenderness: There is no  abdominal tenderness.  Musculoskeletal:        General: No deformity. Normal range of motion.     Cervical back: Normal range of motion and neck supple.     Comments: Mild diffuse tenderness to palpation overlying the lateral aspect of the left hip.  No appreciable ecchymosis.  No rash.  Distal left lower extremity is neurovascular intact.  Skin:    General: Skin is warm and dry.  Neurological:     General: No focal deficit present.     Mental Status: She is alert and oriented to person, place, and time.    ED Results / Procedures / Treatments   Labs (all labs ordered are listed, but only abnormal results are displayed) Labs Reviewed  BASIC METABOLIC PANEL - Abnormal; Notable for the following components:      Result Value   Glucose, Bld 140 (*)    BUN 46 (*)    Creatinine, Ser 1.65 (*)    Calcium 8.5 (*)    GFR, Estimated 34 (*)    All other components within normal limits  CBC WITH DIFFERENTIAL/PLATELET - Abnormal; Notable for the following components:   RBC 3.38 (*)    Hemoglobin 10.5 (*)    HCT 32.1 (*)    All other components within normal limits    EKG None  Radiology DG Hip Unilat W or Wo Pelvis 2-3 Views Left  Result Date: 11/24/2021 CLINICAL DATA:  fall EXAM: DG HIP (WITH OR WITHOUT PELVIS) 2-3V LEFT; LEFT FEMUR 2 VIEWS COMPARISON:  October 22, 2021 FINDINGS: Question a subtle linear lucency along the LEFT greater trochanter in the region of the LEFT femoral neck. Enthesopathic changes of the greater and lesser trochanter. No additional possible fractures noted. Limited assessment of the sacrum secondary to overlapping bowel contents. Mild degenerative changes of the LEFT hip. IMPRESSION: Linear lucency of the LEFT femoral neck along the LEFT greater trochanter could reflect a nondisplaced fracture in the appropriate clinical setting. Consider further evaluation with dedicated cross-sectional imaging if clinical concern. Electronically Signed   By: Valentino Saxon  M.D.   On: 11/24/2021 20:31   DG Femur Min 2 Views Left  Result Date: 11/24/2021 CLINICAL DATA:  fall EXAM: DG HIP (WITH OR WITHOUT PELVIS) 2-3V LEFT; LEFT FEMUR 2 VIEWS COMPARISON:  October 22, 2021 FINDINGS: Question a subtle linear lucency along the LEFT greater trochanter in the region of the LEFT femoral neck. Enthesopathic changes of the greater and lesser trochanter. No additional possible fractures noted. Limited assessment of the sacrum secondary to overlapping bowel contents. Mild degenerative changes of the LEFT hip. IMPRESSION: Linear lucency of the LEFT femoral neck along the LEFT greater trochanter could reflect a nondisplaced fracture in the  appropriate clinical setting. Consider further evaluation with dedicated cross-sectional imaging if clinical concern. Electronically Signed   By: Valentino Saxon M.D.   On: 11/24/2021 20:31    Procedures Procedures    Medications Ordered in ED Medications - No data to display  ED Course/ Medical Decision Making/ A&P                           Medical Decision Making Amount and/or Complexity of Data Reviewed Labs: ordered. Radiology: ordered.    Medical Screen Complete  This patient presented to the ED with complaint of fall.  This complaint involves an extensive number of treatment options. The initial differential diagnosis includes, but is not limited to, trauma secondary to fall, metabolic abnormality,etc  This presentation is: Acute, Chronic, Previously Undiagnosed, Uncertain Prognosis, Complicated, Systemic Symptoms, and Threat to Life/Bodily Function  Patient reports a fall at home.  Patient is complaining of some left hip pain.  Plain films and CT imaging of the left hip are without significant abnormality.  Patient does report recent rehab stay after a fall with resulting hip pain.  She reports that her insurance told her that her time and that rehab facility was up.  She has been at home for approximately last  month.  Patient without indication for admission at this time.  Patient is interested in discussing possibility of placement with social work.  Ambulatory referral placed to both social work and home health.  Patient is advised to closely follow-up with her PCP.  Importance of close follow-up is repeatedly stressed.  Strict return precautions given understood.  Co morbidities that complicated the patient's evaluation  Chronic narcotic use   Additional history obtained:  Additional history obtained from EMS External records from outside sources obtained and reviewed including prior ED visits and prior Inpatient records.    Lab Tests:  I ordered and personally interpreted labs.  The pertinent results include: CBC, BMP   Imaging Studies ordered:  I ordered imaging studies including plain films of left hip, CT of left hip I independently visualized and interpreted obtained imaging which showed no acute pathology I agree with the radiologist interpretation.   Cardiac Monitoring:  The patient was maintained on a cardiac monitor.  I personally viewed and interpreted the cardiac monitor which showed an underlying rhythm of: Normal sinus rhythm    Problem List / ED Course:  Frequent falls   Reevaluation:  After the interventions noted above, I reevaluated the patient and found that they have: improved    Disposition:  After consideration of the diagnostic results and the patients response to treatment, I feel that the patent would benefit from close outpatient follow-up.            Final Clinical Impression(s) / ED Diagnoses Final diagnoses:  Fall, initial encounter    Rx / DC Orders ED Discharge Orders     None         Valarie Merino, MD 11/24/21 2222

## 2021-11-24 NOTE — Discharge Instructions (Signed)
Return for any problem.  ?

## 2021-11-24 NOTE — ED Notes (Signed)
Patient transported to CT 

## 2021-11-24 NOTE — ED Triage Notes (Addendum)
Pt has had multiple falls recently- 3 today alone and EMS came out to put her back in bed. More like sliding out of bed. Dtr been trying to get her in nursing home but pt has been refusing. She is now more amenable. VSS and no injuries reported by EMS. Pt takes narcotics and plavix.

## 2021-11-27 ENCOUNTER — Emergency Department (HOSPITAL_COMMUNITY): Payer: Medicare PPO

## 2021-11-27 ENCOUNTER — Inpatient Hospital Stay (HOSPITAL_COMMUNITY)
Admission: EM | Admit: 2021-11-27 | Discharge: 2021-12-25 | DRG: 061 | Disposition: E | Payer: Medicare PPO | Attending: Neurology | Admitting: Neurology

## 2021-11-27 ENCOUNTER — Inpatient Hospital Stay (HOSPITAL_COMMUNITY): Payer: Medicare PPO

## 2021-11-27 DIAGNOSIS — G936 Cerebral edema: Secondary | ICD-10-CM | POA: Diagnosis not present

## 2021-11-27 DIAGNOSIS — E785 Hyperlipidemia, unspecified: Secondary | ICD-10-CM | POA: Diagnosis present

## 2021-11-27 DIAGNOSIS — Z01818 Encounter for other preprocedural examination: Secondary | ICD-10-CM

## 2021-11-27 DIAGNOSIS — J9690 Respiratory failure, unspecified, unspecified whether with hypoxia or hypercapnia: Secondary | ICD-10-CM | POA: Diagnosis not present

## 2021-11-27 DIAGNOSIS — G911 Obstructive hydrocephalus: Secondary | ICD-10-CM | POA: Diagnosis not present

## 2021-11-27 DIAGNOSIS — I959 Hypotension, unspecified: Secondary | ICD-10-CM | POA: Diagnosis not present

## 2021-11-27 DIAGNOSIS — R509 Fever, unspecified: Secondary | ICD-10-CM

## 2021-11-27 DIAGNOSIS — I63512 Cerebral infarction due to unspecified occlusion or stenosis of left middle cerebral artery: Principal | ICD-10-CM | POA: Diagnosis present

## 2021-11-27 DIAGNOSIS — Z7902 Long term (current) use of antithrombotics/antiplatelets: Secondary | ICD-10-CM

## 2021-11-27 DIAGNOSIS — W19XXXA Unspecified fall, initial encounter: Secondary | ICD-10-CM | POA: Diagnosis present

## 2021-11-27 DIAGNOSIS — I9589 Other hypotension: Secondary | ICD-10-CM | POA: Diagnosis not present

## 2021-11-27 DIAGNOSIS — Z66 Do not resuscitate: Secondary | ICD-10-CM | POA: Diagnosis not present

## 2021-11-27 DIAGNOSIS — Z6831 Body mass index (BMI) 31.0-31.9, adult: Secondary | ICD-10-CM

## 2021-11-27 DIAGNOSIS — R4701 Aphasia: Secondary | ICD-10-CM | POA: Diagnosis not present

## 2021-11-27 DIAGNOSIS — Z789 Other specified health status: Secondary | ICD-10-CM

## 2021-11-27 DIAGNOSIS — I615 Nontraumatic intracerebral hemorrhage, intraventricular: Secondary | ICD-10-CM | POA: Diagnosis not present

## 2021-11-27 DIAGNOSIS — D72829 Elevated white blood cell count, unspecified: Secondary | ICD-10-CM | POA: Diagnosis not present

## 2021-11-27 DIAGNOSIS — Z8673 Personal history of transient ischemic attack (TIA), and cerebral infarction without residual deficits: Secondary | ICD-10-CM

## 2021-11-27 DIAGNOSIS — G8191 Hemiplegia, unspecified affecting right dominant side: Secondary | ICD-10-CM | POA: Diagnosis present

## 2021-11-27 DIAGNOSIS — E870 Hyperosmolality and hypernatremia: Secondary | ICD-10-CM | POA: Diagnosis not present

## 2021-11-27 DIAGNOSIS — E669 Obesity, unspecified: Secondary | ICD-10-CM | POA: Diagnosis present

## 2021-11-27 DIAGNOSIS — N1832 Chronic kidney disease, stage 3b: Secondary | ICD-10-CM | POA: Diagnosis present

## 2021-11-27 DIAGNOSIS — Z515 Encounter for palliative care: Secondary | ICD-10-CM | POA: Diagnosis not present

## 2021-11-27 DIAGNOSIS — G935 Compression of brain: Secondary | ICD-10-CM | POA: Diagnosis not present

## 2021-11-27 DIAGNOSIS — N179 Acute kidney failure, unspecified: Secondary | ICD-10-CM | POA: Diagnosis not present

## 2021-11-27 DIAGNOSIS — I639 Cerebral infarction, unspecified: Secondary | ICD-10-CM | POA: Diagnosis not present

## 2021-11-27 DIAGNOSIS — Z79899 Other long term (current) drug therapy: Secondary | ICD-10-CM

## 2021-11-27 DIAGNOSIS — M069 Rheumatoid arthritis, unspecified: Secondary | ICD-10-CM | POA: Diagnosis present

## 2021-11-27 DIAGNOSIS — I7121 Aneurysm of the ascending aorta, without rupture: Secondary | ICD-10-CM | POA: Diagnosis present

## 2021-11-27 DIAGNOSIS — R296 Repeated falls: Secondary | ICD-10-CM | POA: Diagnosis present

## 2021-11-27 DIAGNOSIS — M25552 Pain in left hip: Secondary | ICD-10-CM | POA: Diagnosis present

## 2021-11-27 DIAGNOSIS — I613 Nontraumatic intracerebral hemorrhage in brain stem: Secondary | ICD-10-CM | POA: Diagnosis present

## 2021-11-27 DIAGNOSIS — Z8249 Family history of ischemic heart disease and other diseases of the circulatory system: Secondary | ICD-10-CM

## 2021-11-27 DIAGNOSIS — I671 Cerebral aneurysm, nonruptured: Secondary | ICD-10-CM | POA: Diagnosis present

## 2021-11-27 DIAGNOSIS — D631 Anemia in chronic kidney disease: Secondary | ICD-10-CM | POA: Diagnosis present

## 2021-11-27 DIAGNOSIS — I161 Hypertensive emergency: Secondary | ICD-10-CM | POA: Diagnosis present

## 2021-11-27 DIAGNOSIS — I6389 Other cerebral infarction: Secondary | ICD-10-CM | POA: Diagnosis not present

## 2021-11-27 DIAGNOSIS — R29702 NIHSS score 2: Secondary | ICD-10-CM | POA: Diagnosis present

## 2021-11-27 DIAGNOSIS — I129 Hypertensive chronic kidney disease with stage 1 through stage 4 chronic kidney disease, or unspecified chronic kidney disease: Secondary | ICD-10-CM | POA: Diagnosis present

## 2021-11-27 DIAGNOSIS — I10 Essential (primary) hypertension: Secondary | ICD-10-CM | POA: Diagnosis not present

## 2021-11-27 DIAGNOSIS — I619 Nontraumatic intracerebral hemorrhage, unspecified: Secondary | ICD-10-CM | POA: Diagnosis not present

## 2021-11-27 DIAGNOSIS — Z833 Family history of diabetes mellitus: Secondary | ICD-10-CM

## 2021-11-27 DIAGNOSIS — I63539 Cerebral infarction due to unspecified occlusion or stenosis of unspecified posterior cerebral artery: Secondary | ICD-10-CM | POA: Diagnosis not present

## 2021-11-27 DIAGNOSIS — Z9282 Status post administration of tPA (rtPA) in a different facility within the last 24 hours prior to admission to current facility: Secondary | ICD-10-CM | POA: Diagnosis not present

## 2021-11-27 LAB — COMPREHENSIVE METABOLIC PANEL
ALT: 15 U/L (ref 0–44)
AST: 31 U/L (ref 15–41)
Albumin: 2.9 g/dL — ABNORMAL LOW (ref 3.5–5.0)
Alkaline Phosphatase: 101 U/L (ref 38–126)
Anion gap: 10 (ref 5–15)
BUN: 40 mg/dL — ABNORMAL HIGH (ref 8–23)
CO2: 25 mmol/L (ref 22–32)
Calcium: 8.6 mg/dL — ABNORMAL LOW (ref 8.9–10.3)
Chloride: 106 mmol/L (ref 98–111)
Creatinine, Ser: 1.53 mg/dL — ABNORMAL HIGH (ref 0.44–1.00)
GFR, Estimated: 37 mL/min — ABNORMAL LOW (ref >60)
Glucose, Bld: 89 mg/dL (ref 70–99)
Potassium: 4.3 mmol/L (ref 3.5–5.1)
Sodium: 141 mmol/L (ref 135–145)
Total Bilirubin: 0.6 mg/dL (ref 0.3–1.2)
Total Protein: 6 g/dL — ABNORMAL LOW (ref 6.5–8.1)

## 2021-11-27 LAB — POCT I-STAT 7, (LYTES, BLD GAS, ICA,H+H)
Acid-base deficit: 2 mmol/L (ref 0.0–2.0)
Bicarbonate: 24.7 mmol/L (ref 20.0–28.0)
Calcium, Ion: 0.85 mmol/L — CL (ref 1.15–1.40)
HCT: 30 % — ABNORMAL LOW (ref 36.0–46.0)
Hemoglobin: 10.2 g/dL — ABNORMAL LOW (ref 12.0–15.0)
O2 Saturation: 100 %
Patient temperature: 98
Potassium: 3.8 mmol/L (ref 3.5–5.1)
Sodium: 144 mmol/L (ref 135–145)
TCO2: 26 mmol/L (ref 22–32)
pCO2 arterial: 48.7 mmHg — ABNORMAL HIGH (ref 32.0–48.0)
pH, Arterial: 7.312 — ABNORMAL LOW (ref 7.350–7.450)
pO2, Arterial: 440 mmHg — ABNORMAL HIGH (ref 83.0–108.0)

## 2021-11-27 LAB — DIFFERENTIAL
Abs Immature Granulocytes: 0.12 10*3/uL — ABNORMAL HIGH (ref 0.00–0.07)
Basophils Absolute: 0 10*3/uL (ref 0.0–0.1)
Basophils Relative: 1 %
Eosinophils Absolute: 0.3 10*3/uL (ref 0.0–0.5)
Eosinophils Relative: 4 %
Immature Granulocytes: 2 %
Lymphocytes Relative: 28 %
Lymphs Abs: 2 10*3/uL (ref 0.7–4.0)
Monocytes Absolute: 1 10*3/uL (ref 0.1–1.0)
Monocytes Relative: 14 %
Neutro Abs: 3.7 10*3/uL (ref 1.7–7.7)
Neutrophils Relative %: 51 %

## 2021-11-27 LAB — CBG MONITORING, ED: Glucose-Capillary: 80 mg/dL (ref 70–99)

## 2021-11-27 LAB — CBC
HCT: 32.3 % — ABNORMAL LOW (ref 36.0–46.0)
Hemoglobin: 10.3 g/dL — ABNORMAL LOW (ref 12.0–15.0)
MCH: 31.3 pg (ref 26.0–34.0)
MCHC: 31.9 g/dL (ref 30.0–36.0)
MCV: 98.2 fL (ref 80.0–100.0)
Platelets: 177 10*3/uL (ref 150–400)
RBC: 3.29 MIL/uL — ABNORMAL LOW (ref 3.87–5.11)
RDW: 14.9 % (ref 11.5–15.5)
WBC: 7.1 10*3/uL (ref 4.0–10.5)
nRBC: 0 % (ref 0.0–0.2)

## 2021-11-27 LAB — TYPE AND SCREEN
ABO/RH(D): O POS
Antibody Screen: NEGATIVE

## 2021-11-27 LAB — FIBRINOGEN
Fibrinogen: 573 mg/dL — ABNORMAL HIGH (ref 210–475)
Fibrinogen: 569 mg/dL — ABNORMAL HIGH (ref 210–475)
Fibrinogen: 646 mg/dL — ABNORMAL HIGH (ref 210–475)

## 2021-11-27 LAB — I-STAT CHEM 8, ED
BUN: 36 mg/dL — ABNORMAL HIGH (ref 8–23)
Calcium, Ion: 0.85 mmol/L — CL (ref 1.15–1.40)
Chloride: 108 mmol/L (ref 98–111)
Creatinine, Ser: 1.6 mg/dL — ABNORMAL HIGH (ref 0.44–1.00)
Glucose, Bld: 83 mg/dL (ref 70–99)
HCT: 31 % — ABNORMAL LOW (ref 36.0–46.0)
Hemoglobin: 10.5 g/dL — ABNORMAL LOW (ref 12.0–15.0)
Potassium: 4.2 mmol/L (ref 3.5–5.1)
Sodium: 141 mmol/L (ref 135–145)
TCO2: 29 mmol/L (ref 22–32)

## 2021-11-27 LAB — GLUCOSE, CAPILLARY: Glucose-Capillary: 92 mg/dL (ref 70–99)

## 2021-11-27 LAB — MAGNESIUM: Magnesium: 1.7 mg/dL (ref 1.7–2.4)

## 2021-11-27 LAB — HIV ANTIBODY (ROUTINE TESTING W REFLEX): HIV Screen 4th Generation wRfx: NONREACTIVE

## 2021-11-27 LAB — PROTIME-INR
INR: 1 (ref 0.8–1.2)
Prothrombin Time: 13.2 seconds (ref 11.4–15.2)

## 2021-11-27 LAB — ETHANOL: Alcohol, Ethyl (B): <10 mg/dL (ref <10)

## 2021-11-27 LAB — APTT: aPTT: 21 seconds — ABNORMAL LOW (ref 24–36)

## 2021-11-27 MED ORDER — PROPOFOL 1000 MG/100ML IV EMUL
0.0000 ug/kg/min | INTRAVENOUS | Status: DC
  Administered 2021-11-27: 20 ug/kg/min via INTRAVENOUS
  Administered 2021-11-27: 25 ug/kg/min via INTRAVENOUS
  Filled 2021-11-27 (×2): qty 100

## 2021-11-27 MED ORDER — MIDAZOLAM HCL 2 MG/2ML IJ SOLN
2.0000 mg | Freq: Once | INTRAMUSCULAR | Status: AC
  Administered 2021-11-27: 2 mg via INTRAVENOUS

## 2021-11-27 MED ORDER — SENNOSIDES-DOCUSATE SODIUM 8.6-50 MG PO TABS
1.0000 | ORAL_TABLET | Freq: Every evening | ORAL | Status: DC | PRN
  Filled 2021-11-27: qty 1

## 2021-11-27 MED ORDER — ACETAMINOPHEN 650 MG RE SUPP: 650.0000 mg | RECTAL | Status: DC | PRN

## 2021-11-27 MED ORDER — DOCUSATE SODIUM 50 MG/5ML PO LIQD
100.0000 mg | Freq: Two times a day (BID) | ORAL | Status: DC
  Administered 2021-11-27 – 2021-12-02 (×9): 100 mg
  Filled 2021-11-27 (×9): qty 10

## 2021-11-27 MED ORDER — FENTANYL CITRATE PF 50 MCG/ML IJ SOSY
100.0000 ug | PREFILLED_SYRINGE | Freq: Once | INTRAMUSCULAR | Status: AC
  Administered 2021-11-27: 100 ug via INTRAVENOUS

## 2021-11-27 MED ORDER — TENECTEPLASE FOR STROKE
0.2500 mg/kg | PACK | Freq: Once | INTRAVENOUS | Status: AC
  Administered 2021-11-27: 23 mg via INTRAVENOUS
  Filled 2021-11-27: qty 10

## 2021-11-27 MED ORDER — TRANEXAMIC ACID-NACL 1000-0.7 MG/100ML-% IV SOLN
1000.0000 mg | INTRAVENOUS | Status: AC
  Administered 2021-11-27: 1000 mg via INTRAVENOUS

## 2021-11-27 MED ORDER — LABETALOL HCL 5 MG/ML IV SOLN
INTRAVENOUS | Status: AC
  Filled 2021-11-27: qty 4

## 2021-11-27 MED ORDER — PANTOPRAZOLE SODIUM 40 MG IV SOLR
40.0000 mg | Freq: Every day | INTRAVENOUS | Status: DC
  Administered 2021-11-27 – 2021-12-02 (×6): 40 mg via INTRAVENOUS
  Filled 2021-11-27 (×5): qty 40

## 2021-11-27 MED ORDER — PROPOFOL 1000 MG/100ML IV EMUL
INTRAVENOUS | Status: AC
  Filled 2021-11-27: qty 100

## 2021-11-27 MED ORDER — STROKE: EARLY STAGES OF RECOVERY BOOK
Freq: Once | Status: AC
  Filled 2021-11-27: qty 1

## 2021-11-27 MED ORDER — SUCCINYLCHOLINE CHLORIDE 200 MG/10ML IV SOSY
100.0000 mg | PREFILLED_SYRINGE | Freq: Once | INTRAVENOUS | Status: AC
  Administered 2021-11-27: 100 mg via INTRAVENOUS

## 2021-11-27 MED ORDER — IOHEXOL 350 MG/ML SOLN
80.0000 mL | Freq: Once | INTRAVENOUS | Status: AC | PRN
  Administered 2021-11-27: 80 mL via INTRAVENOUS

## 2021-11-27 MED ORDER — FENTANYL CITRATE PF 50 MCG/ML IJ SOSY
25.0000 ug | PREFILLED_SYRINGE | INTRAMUSCULAR | Status: DC | PRN
  Administered 2021-11-27 – 2021-11-29 (×4): 50 ug via INTRAVENOUS
  Administered 2021-11-29: 100 ug via INTRAVENOUS
  Administered 2021-11-29 – 2021-11-30 (×3): 50 ug via INTRAVENOUS
  Filled 2021-11-27: qty 1
  Filled 2021-11-27 (×2): qty 2
  Filled 2021-11-27 (×4): qty 1
  Filled 2021-11-27: qty 2

## 2021-11-27 MED ORDER — ACETAMINOPHEN 325 MG PO TABS: 650.0000 mg | ORAL_TABLET | ORAL | Status: DC | PRN

## 2021-11-27 MED ORDER — ACETAMINOPHEN 160 MG/5ML PO SOLN
650.0000 mg | ORAL | Status: DC | PRN
  Administered 2021-11-28 – 2021-12-03 (×9): 650 mg
  Filled 2021-11-27 (×9): qty 20.3

## 2021-11-27 MED ORDER — NALOXONE HCL 0.4 MG/ML IJ SOLN: 0.4000 mg | INTRAMUSCULAR | Status: DC | PRN

## 2021-11-27 MED ORDER — SODIUM CHLORIDE 0.9 % IV SOLN: INTRAVENOUS | Status: DC

## 2021-11-27 MED ORDER — ETOMIDATE 2 MG/ML IV SOLN
10.0000 mg | Freq: Once | INTRAVENOUS | Status: AC
  Administered 2021-11-27: 10 mg via INTRAVENOUS

## 2021-11-27 MED ORDER — SODIUM CHLORIDE 0.9 % IV SOLN: Freq: Once | INTRAVENOUS | Status: DC

## 2021-11-27 MED ORDER — FENTANYL CITRATE PF 50 MCG/ML IJ SOSY: 25.0000 ug | PREFILLED_SYRINGE | INTRAMUSCULAR | Status: DC | PRN

## 2021-11-27 MED ORDER — MAGNESIUM SULFATE 2 GM/50ML IV SOLN
2.0000 g | Freq: Once | INTRAVENOUS | Status: AC
  Administered 2021-11-28: 2 g via INTRAVENOUS
  Filled 2021-11-27: qty 50

## 2021-11-27 MED ORDER — POLYETHYLENE GLYCOL 3350 17 G PO PACK
17.0000 g | PACK | Freq: Every day | ORAL | Status: DC
  Administered 2021-11-28 – 2021-11-30 (×3): 17 g
  Filled 2021-11-27 (×4): qty 1

## 2021-11-27 MED ORDER — ORAL CARE MOUTH RINSE
15.0000 mL | OROMUCOSAL | Status: DC
  Administered 2021-11-27 – 2021-12-03 (×56): 15 mL via OROMUCOSAL

## 2021-11-27 MED ORDER — CLEVIDIPINE BUTYRATE 0.5 MG/ML IV EMUL
0.0000 mg/h | INTRAVENOUS | Status: DC
  Administered 2021-11-27: 2 mg/h via INTRAVENOUS
  Administered 2021-11-27: 6 mg/h via INTRAVENOUS
  Administered 2021-11-28: 9 mg/h via INTRAVENOUS
  Administered 2021-11-28: 4 mg/h via INTRAVENOUS
  Administered 2021-11-28 (×2): 9 mg/h via INTRAVENOUS
  Administered 2021-11-28: 8 mg/h via INTRAVENOUS
  Administered 2021-11-28: 9 mg/h via INTRAVENOUS
  Administered 2021-11-30: 2 mg/h via INTRAVENOUS
  Administered 2021-11-30: 6 mg/h via INTRAVENOUS
  Administered 2021-11-30: 4 mg/h via INTRAVENOUS
  Filled 2021-11-27 (×11): qty 50

## 2021-11-27 MED ORDER — CLEVIDIPINE BUTYRATE 0.5 MG/ML IV EMUL
INTRAVENOUS | Status: AC
  Filled 2021-11-27: qty 50

## 2021-11-27 MED ORDER — CALCIUM GLUCONATE-NACL 2-0.675 GM/100ML-% IV SOLN
2.0000 g | Freq: Once | INTRAVENOUS | Status: AC
  Administered 2021-11-28: 2000 mg via INTRAVENOUS
  Filled 2021-11-27: qty 100

## 2021-11-27 MED ORDER — CHLORHEXIDINE GLUCONATE 0.12% ORAL RINSE (MEDLINE KIT)
15.0000 mL | Freq: Two times a day (BID) | OROMUCOSAL | Status: DC
  Administered 2021-11-27 – 2021-12-03 (×12): 15 mL via OROMUCOSAL

## 2021-11-27 NOTE — Progress Notes (Addendum)
Brief Neurology Progress Note  The patient declined and became less responsive and not moving right side. Repeat NCT head ordered. 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  Patient's head of bed was elevated, post-thrombolytic reversal protocol initiated TXA cryoprecipitate and clevidipine infusion started with goal SBP<140.   This patient is critically ill and at significant risk of neurological worsening, death and care requires constant monitoring of vital signs, hemodynamics,respiratory and cardiac monitoring, neurological assessment, discussion with family, other specialists and medical decision making of high complexity. I spent 25 minutes of neurocritical care time  in the care of  this patient. This was time spent independent of any time provided by nurse practitioner or PA.  Electronically signed by:  Lynnae Sandhoff, MD Page: [id number] 12/23/2021, 4:36 PM

## 2021-11-27 NOTE — Progress Notes (Signed)
Pt becoming stuporous. CCM consult requested. Confirmed w/ [facility] that bed was available.

## 2021-11-27 NOTE — ED Provider Notes (Addendum)
[facility] EMERGENCY DEPARTMENT Provider Note  CSN: [id number] Arrival date & time: 12/04/2021 1337  Chief Complaint(s) Altered Mental Status and Code Stroke  HPI Michelle Graves is a 71 y.o. female with PMH CKD, anemia, previous CVA's emergency department for evaluation of altered mental status.  Patient was seen 2 days ago for hip pain and was started on oxycodone and trazodone for sleep.  She arrives via EMS after being called by the patient's physical therapist for the patient not answering the door.  On EMS arrival, patient alert and oriented x1 and delirious.  Initial GCS of 10 but the patient improved with return of normal mental status.  However, on my initial evaluation, patient with significant right upper extremity right lower extremity weakness.  Patient unable to provide history as she is currently altered.   Altered Mental Status  Past Medical History Past Medical History:  Diagnosis Date   Acute on chronic kidney failure ([place])    Anemia    Arthritis    Chest pain    Diverticulitis    Hypertension    Stroke [facility])    UTI (urinary tract infection) 10/2019   Patient Active Problem List   Diagnosis Date Noted   Acute alteration in mental status    Uncontrolled hypertension    Altered mental status 03/19/2021   Multinodular goiter [id number]   Acute metabolic encephalopathy [id number]   Hypertensive urgency 01/21/2021   Sepsis secondary to UTI ([place]) 01/21/2021   Adrenal insufficiency ([place]) 12/18/2020   Encephalopathy 12/17/2020   TIA (transient ischemic attack) 12/15/2020   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   Bowel obstruction ([place]) 05/06/2020   SIRS (systemic inflammatory response syndrome) ([place]) 05/06/2020   UTI (urinary tract infection) 11/08/2019   Major depressive disorder, recurrent episode, moderate ([place]) 11/08/2019   Urinary retention 10/28/2019   Acute on chronic kidney failure ([place]) 10/27/2019   Malnutrition of moderate degree  10/03/2019   Colitis 09/30/2019   AKI (acute kidney injury) ([place]) 05/28/2019   Nausea & vomiting 05/28/2019   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   Anemia, chronic disease 05/28/2019   Swelling    Small vessel disease, cerebrovascular    Acute ischemic stroke ([place])    Weakness    Acute left ankle pain    Acute on chronic renal failure ([place]) 05/13/2019   Weakness of both lower extremities 05/13/2019   Cerebral thrombosis with cerebral infarction 03/31/2019   New onset a-fib ([place]) 03/27/2019   Acute kidney injury superimposed on CKD ([place]) 03/24/2019   Hypotension 03/24/2019   Hematemesis 03/24/2019   Dark stools 03/24/2019   Diverticulitis of intestine without perforation or abscess without bleeding    Sinus tachycardia 08/27/2016   Diverticulitis 08/26/2016   HYPERTENSION, MALIGNANT ESSENTIAL 06/24/2007   CKD (chronic kidney disease), stage III ([place]) 06/24/2007   Rheumatoid arthritis ([place]) 06/24/2007   Home Medication(s) Prior to Admission medications   Medication Sig Start Date End Date Taking? Authorizing Provider  carvedilol (COREG) 25 MG tablet Take 1 tablet (25 mg total) by mouth 2 (two) times daily. 01/03/21   Fargo, Amy E, NP  clopidogrel (PLAVIX) 75 MG tablet Take 1 tablet (75 mg total) by mouth daily. 03/12/21   Garvin Fila, MD  diclofenac Sodium (VOLTAREN) 1 % GEL Apply 1 application topically daily as needed (for muscle or joint pain). 09/17/21   [provider]  diltiazem (CARDIZEM CD) 180 MG 24 hr capsule Take 180 mg by mouth daily. 04/01/21   [provider]  DULoxetine (CYMBALTA) 60 MG capsule Take 1 capsule (60 mg total) by mouth at bedtime. 01/03/21   Fargo, Amy E, NP  folic acid (FOLVITE) 1 MG tablet Take 1 mg by mouth daily. 10/11/21   [provider]  hydroxychloroquine (PLAQUENIL) 200 MG tablet Take 1 tablet (200 mg total) by mouth daily. Patient taking differently: Take 200 mg by mouth 2 (two) times daily. 01/03/21   Fargo, Amy E, NP   leflunomide (ARAVA) 20 MG tablet Take 20 mg by mouth daily. 05/27/21   [provider]  Multiple Vitamins-Minerals (CENTRUM WOMEN) TABS Take 1 tablet by mouth daily.    [provider]  oxyCODONE-acetaminophen (PERCOCET) 7.5-325 MG tablet Take 1 tablet by mouth every 6 (six) hours as needed for severe pain or moderate pain. 10/23/21   Charlesetta Shanks, MD  oxyCODONE-acetaminophen (PERCOCET/ROXICET) 5-325 MG tablet Take 1 tablet by mouth 2 (two) times daily as needed for moderate pain. 10/03/21   [provider]  pantoprazole (PROTONIX) 40 MG tablet Take 1 tablet (40 mg total) by mouth daily. Patient taking differently: Take 40 mg by mouth daily as needed (for acid reflux). 01/03/21   Yvonna Alanis, NP  pregabalin (LYRICA) 50 MG capsule Take by mouth. 11/19/21   [provider]  rosuvastatin (CRESTOR) 10 MG tablet Take 10 mg by mouth daily.    [provider]  traZODone (DESYREL) 50 MG tablet Take 50 mg by mouth at bedtime as needed for sleep. 10/08/21   [provider]                                                                                                                                    Past Surgical History Past Surgical History:  Procedure Laterality Date   BIOPSY  04/01/2019   Procedure: BIOPSY;  Surgeon: Otis Brace, MD;  Location: [place];  Service: Gastroenterology;;   BIOPSY  10/05/2019   Procedure: BIOPSY;  Surgeon: Otis Brace, MD;  Location: [place];  Service: Gastroenterology;;   BIOPSY  05/10/2020   Procedure: BIOPSY;  Surgeon: Otis Brace, MD;  Location: [facility] ENDOSCOPY;  Service: Gastroenterology;;   BIOPSY  11/29/2020   Procedure: BIOPSY;  Surgeon: Wilford Corner, MD;  Location: [place];  Service: Gastroenterology;;   COLONOSCOPY N/A 11/29/2020   Procedure: COLONOSCOPY;  Surgeon: Wilford Corner, MD;  Location: [place];  Service: Gastroenterology;  Laterality: N/A;   COLONOSCOPY WITH  PROPOFOL N/A 04/01/2019   Procedure: COLONOSCOPY WITH PROPOFOL;  Surgeon: Otis Brace, MD;  Location: [place];  Service: Gastroenterology;  Laterality: N/A;   COLONOSCOPY WITH PROPOFOL N/A 10/05/2019   Procedure: COLONOSCOPY WITH PROPOFOL;  Surgeon: Otis Brace, MD;  Location: [place];  Service: Gastroenterology;  Laterality: N/A;   COLONOSCOPY WITH PROPOFOL N/A 05/10/2020   Procedure: COLONOSCOPY WITH PROPOFOL;  Surgeon: Otis Brace, MD;  Location: [place];  Service: Gastroenterology;  Laterality: N/A;   ESOPHAGOGASTRODUODENOSCOPY (EGD) WITH PROPOFOL N/A  04/01/2019   Procedure: ESOPHAGOGASTRODUODENOSCOPY (EGD) WITH PROPOFOL;  Surgeon: Otis Brace, MD;  Location: [facility] ENDOSCOPY;  Service: Gastroenterology;  Laterality: N/A;   ESOPHAGOGASTRODUODENOSCOPY (EGD) WITH PROPOFOL N/A 10/05/2019   Procedure: ESOPHAGOGASTRODUODENOSCOPY (EGD) WITH PROPOFOL;  Surgeon: Otis Brace, MD;  Location: [place];  Service: Gastroenterology;  Laterality: N/A;   POLYPECTOMY  11/29/2020   Procedure: POLYPECTOMY;  Surgeon: Wilford Corner, MD;  Location: [facility] ENDOSCOPY;  Service: Gastroenterology;;   SUBMUCOSAL TATTOO INJECTION  11/29/2020   Procedure: SUBMUCOSAL TATTOO INJECTION;  Surgeon: Wilford Corner, MD;  Location: [facility] ENDOSCOPY;  Service: Gastroenterology;;   TONSILLECTOMY     Family History Family History  Problem Relation Age of Onset   Hypertension Mother    Diabetes Mother    CAD Father        died of MI at age 10   Hypertension Father    Diabetes Sister    Diabetes Sister    Kidney disease Neg Hx    Adrenal disorder Neg Hx     Social History Social History   Tobacco Use   Smoking status: Never    Passive exposure: Never   Smokeless tobacco: Never  Vaping Use   Vaping Use: Never used  Substance Use Topics   Alcohol use: Not Currently    Alcohol/week: 0.0 standard drinks    Comment: sometimes    Drug use: No   Allergies Atorvastatin  Review of  Systems Review of Systems  Unable to perform ROS: Mental status change   Physical Exam Vital Signs  I have reviewed the triage vital signs BP (!) 168/104    Pulse 77    Temp 98.6 F (37 C)    Resp (!) 21    Wt 91 kg    SpO2 94%    BMI 31.42 kg/m   Physical Exam Vitals and nursing note reviewed.  Constitutional:      General: She is not in acute distress.    Appearance: She is well-developed.  HENT:     Head: Normocephalic and atraumatic.  Eyes:     Conjunctiva/sclera: Conjunctivae normal.  Cardiovascular:     Rate and Rhythm: Normal rate and regular rhythm.     Heart sounds: No murmur heard. Pulmonary:     Effort: Pulmonary effort is normal. No respiratory distress.     Breath sounds: Normal breath sounds.  Abdominal:     Palpations: Abdomen is soft.     Tenderness: There is no abdominal tenderness.  Musculoskeletal:        General: No swelling.     Cervical back: Neck supple.  Skin:    General: Skin is warm and dry.     Capillary Refill: Capillary refill takes less than 2 seconds.  Neurological:     Mental Status: She is alert. She is disoriented.     Cranial Nerves: No cranial nerve deficit.     Motor: Weakness present.  Psychiatric:        Mood and Affect: Mood normal.    ED Results and Treatments Labs (all labs ordered are listed, but only abnormal results are displayed) Labs Reviewed  RESP PANEL BY RT-PCR (FLU A&B, COVID) ARPGX2  ETHANOL  PROTIME-INR  APTT  CBC  DIFFERENTIAL  COMPREHENSIVE METABOLIC PANEL  RAPID URINE DRUG SCREEN, HOSP PERFORMED  URINALYSIS, ROUTINE W REFLEX MICROSCOPIC  I-STAT CHEM 8, ED  Radiology CT HEAD CODE STROKE WO CONTRAST  Result Date: 12/15/2021 CLINICAL DATA:  Code stroke.  Neuro deficit, acute, stroke suspected EXAM: CT HEAD WITHOUT CONTRAST TECHNIQUE: Contiguous axial images were obtained from the base  of the skull through the vertex without intravenous contrast. RADIATION DOSE REDUCTION: This exam was performed according to the departmental dose-optimization program which includes automated exposure control, adjustment of the mA and/or kV according to patient size and/or use of iterative reconstruction technique. COMPARISON:  06/27/2021 FINDINGS: Brain: There is no acute intracranial hemorrhage, mass effect, or edema. No new loss of gray-white differentiation. Confluent areas of low-density in the supratentorial white matter are nonspecific but probably reflect similar moderate to marked chronic microvascular ischemic changes. Prominence of the ventricles and sulci reflects similar parenchymal volume loss. No extra-axial collection. Vascular: No hyperdense vessel. Intracranial atherosclerotic calcification at the skull base. Skull: Unremarkable. Sinuses/Orbits: No acute abnormality. Other: Mastoid air cells are clear. ASPECTS ([place] Stroke Program Early CT Score) - Ganglionic level infarction (caudate, lentiform nuclei, internal capsule, insula, M1-M3 cortex): 7 - Supraganglionic infarction (M4-M6 cortex): 3 Total score (0-10 with 10 being normal): 10 IMPRESSION: There is no acute intracranial hemorrhage or evidence of acute infarction. ASPECT score is 10. These results were communicated to Dr. Theda Sers at 2:32 pm on 12/22/2021 by text page via the [facility] messaging system. Electronically Signed   By: Macy Mis M.D.   On: 12/19/2021 14:34    Pertinent labs & imaging results that were available during my care of the patient were reviewed by me and considered in my medical decision making (see MDM for details).  Medications Ordered in ED Medications  naloxone (NARCAN) injection 0.4 mg (has no administration in time range)  tenecteplase (TNKASE) injection for Stroke 23 mg (23 mg Intravenous Given 12/06/2021 1452)  iohexol (OMNIPAQUE) 350 MG/ML injection 80 mL (80 mLs Intravenous Contrast Given 12/04/2021 1506)                                                                                                                                      Procedures .Critical Care Performed by: Teressa Lower, MD Authorized by: Teressa Lower, MD   Critical care provider statement:    Critical care time (minutes):  30   Critical care was necessary to treat or prevent imminent or life-threatening deterioration of the following conditions: stroke with TNK.   Critical care was time spent personally by me on the following activities:  Development of treatment plan with patient or surrogate, discussions with consultants, evaluation of patient's response to treatment, examination of patient, ordering and review of laboratory studies, ordering and review of radiographic studies, ordering and performing treatments and interventions, pulse oximetry, re-evaluation of patient's condition and review of old charts  (including critical care time)  Medical Decision Making / ED Course   This patient presents to the ED for concern of weakness and altered mental status, this involves an  extensive number of treatment options, and is a complaint that carries with it a high risk of complications and morbidity.  The differential diagnosis includes CVA, encephalopathy, opioid intoxication, UTI  MDM: Patient seen emergency department for evaluation of weakness and encephalopathy.  Physical exam reveals an potentially encephalopathic patient, but with significant right upper extremity weakness, word finding difficulty, and right lower extremity weakness.  Initial presentation concerning for acute CVA.  Code stroke called as the patient was normal with EMS 30 minutes prior to arrival and is within the window for intervention.  Initial CT head stroke is negative for ICH.   After neurology evaluation, neurology decided to administer TNK for patient's potentially life altering deficits.  At time of signout, patient pending CT angiography as IV  access difficult.  Patient then signed out to oncoming provider.  Please see provider signout for continuation of work-up.  Patient will be admitted to the neurology service for monitoring after TNKase ministration.   Additional history obtained:  -External records from outside source obtained and reviewed including: Chart review including previous notes, labs, imaging, consultation notes   Lab Tests: -I ordered, reviewed, and interpreted labs.   The pertinent results include:   Labs Reviewed  RESP PANEL BY RT-PCR (FLU A&B, COVID) ARPGX2  ETHANOL  PROTIME-INR  APTT  CBC  DIFFERENTIAL  COMPREHENSIVE METABOLIC PANEL  RAPID URINE DRUG SCREEN, HOSP PERFORMED  URINALYSIS, ROUTINE W REFLEX MICROSCOPIC  I-STAT CHEM 8, ED     Imaging Studies ordered: I ordered imaging studies including CTH I independently visualized and interpreted imaging. I agree with the radiologist interpretation  CT angio pending    Medicines ordered and prescription drug management: Meds ordered this encounter  Medications   naloxone (NARCAN) injection 0.4 mg   tenecteplase (TNKASE) injection for Stroke 23 mg   iohexol (OMNIPAQUE) 350 MG/ML injection 80 mL    -I have reviewed the patients home medicines and have made adjustments as needed  Critical interventions Evaluation of patient with symptoms concerning for acute CVA receiving TNK, consultation of neurology  Consultations Obtained: I requested consultation with the neurologist,  and discussed lab and imaging findings as well as pertinent plan - they recommend: TNK and admit   Cardiac Monitoring: The patient was maintained on a cardiac monitor.  I personally viewed and interpreted the cardiac monitored which showed an underlying rhythm of: NSR  Social Determinants of Health:  Factors impacting patients care include: none   Reevaluation: After the interventions noted above, I reevaluated the patient and found that they have :stayed the  same  Co morbidities that complicate the patient evaluation  Past Medical History:  Diagnosis Date   Acute on chronic kidney failure ([place])    Anemia    Arthritis    Chest pain    Diverticulitis    Hypertension    Stroke [facility])    UTI (urinary tract infection) 10/2019      Dispostion: I considered admission for this patient, and after continuation of work-up, patient will likely be admitted.  Please see provider signout note for disposition.     Final Clinical Impression(s) / ED Diagnoses Final diagnoses:  Difficult intravenous access     @PCDICTATION @    Teressa Lower, MD 12/02/2021 1533    Dereon Corkery, [place], MD 11/28/21 [phone]

## 2021-11-27 NOTE — Consult Note (Signed)
NAME:  Michelle Graves, MRN:  [id number], DOB:  [birth year], LOS: 0 ADMISSION DATE:  12/11/2021, CONSULTATION DATE:  12/15/2021 REFERRING MD:  Dr. Theda Sers, CHIEF COMPLAINT:  Aphasia    History of Present Illness:  HPI obtained from medical chart review as patient is unable to provide due to altered mental status.   71 year old female with PMH as below who presented to ER with complaints of altered mental status.  EMS was called after patient was not answering door for physical therapy.  She was seen two days ago for hip pain and started on oxycodone and trazadone for sleep.  She was alert and oriented x1 on EMS arrival, LSW 1/31 around 0900, but improved by arrival to triage and was alert and oriented x 4.  While in triage, she then became severely aphasic, and noted to have right sided weakness.  A code stroke was activated and taken for [facility]  which was negative for acute ICH or acute infarction.   She was given TNK full dose at 1452.    She then was noted to have declining mental status.  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  She was reversed with TXA and started on cryo.  She was transferred to the ICU, where she was unable to protect her airway and emergently intubated on arrival.    Pertinent  Medical History  RA, CKD3b, anemia, HTN, CVA, diverticulitis, HLD, ascending aortic aneurysm   Significant Hospital Events: Including procedures, antibiotic start and stop dates in addition to other pertinent events   2/1 presented with aphasia and right sided weakness.  CTH neg.  S/p TNK complicated by IPH s/p TXA and cryo and requiring intubation for airway protection.    12/15/2021 CTH:  There is no acute intracranial hemorrhage or evidence of acute infarction. ASPECT score is 10. CTA neck:  The common carotid, internal carotid and vertebral arteries are patent within the neck without stenosis.  Aortic Atherosclerosis.   CTA head:   No intracranial large vessel occlusion or proximal high-grade arterial stenosis identified.  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 Repeat CTH post TNK showed an 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  Interim History / Subjective:  On cleviprex on arrival to ICU   Objective   Blood pressure 127/83, pulse 78, temperature 98.6 F (37 C), resp. rate 15, weight 91 kg, SpO2 98 %.       No intake or output data in the 24 hours ending 12/15/2021 1720 Filed Weights   12/20/2021 1403  Weight: 91 kg   Examination: General:  Critically ill adult female  HEENT: MM pink/dry, pupils R 3/sluggish, L irregular, edentulous  Neuro:  minimally opens eyes to noxious stimuli, flaccid in RUE, localizes in RUE, withdrawals in LE-> L > R, does not follow commands, remains aphasic  CV: rr, NSR  PULM:  cheyne stokes respirations and bradypnea, gurgling and unable to protect airway GI: soft, bs+ Extremities: warm/dry, no LE edema  Skin: no rashes  Resolved Hospital Problem list    Assessment & Plan:   Aphasia and Right sided weakness concerning for acute stroke s/p TNK complicated by IPH  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 - per Neurology - nothing surgical per NSGY - strict SBP control < 140, cleviprex as needed - serial neuro exams - repeat CTH in 6 hours or sooner if neuro changes - s/p TXA and finishing cryo.  Pending initial fibrinogen level and will need post cryo transfusion fibrinogen level.  - MRI brain when stable  - remainder of stroke workup per Neurology - hold home plavix - SCDs only   Hypertension, poorly controlled  - highest documented  BP 169/101.  Cleviprex for SBP < 140 - hold home coreg, and diltiazem  Acute respiratory insufficiency in the setting above  - s/p emergent intubation - full MV support, 8cc/kg IBW with goal Pplat <30 and DP<15  - VAP prevention protocol/ PPI - PAD protocol for sedation> prppofol and prn fentanyl for RASS goal 0/-1 with bowel regimen - wean FiO2 as able for SpO2 >92%  - daily SAT & SBT starting 2/2 - CXR and ABG post intubation -> will retract ETT by 1cm    Normocytic anemia - trend CBC, transfuse for <7  CKD3a - purwick for now - monitor for urinary retention q 4hrs  - trend BMET / mag  - strict I/Os, avoid nephrotoxins, replace electrolytes as needed   RA - hold home plaquenil and leflunomide   Best Practice (right click and "Reselect all SmartList Selections" daily)   Diet/type: NPO DVT prophylaxis: SCD GI prophylaxis: PPI Lines: N/A Foley:  N/A Code Status:  full code Last date of multidisciplinary goals of care discussion [per primary ]  Labs   CBC: Recent Labs  Lab 11/24/21 2001 11/30/2021 1554 12/15/2021 1639  WBC 8.0 7.1  --   NEUTROABS 4.9 3.7  --   HGB 10.5* 10.3* 10.5*  HCT 32.1* 32.3* 31.0*  MCV 95.0 98.2  --   PLT 181 177  --     Basic Metabolic Panel: Recent Labs  Lab 11/24/21 2001 12/10/2021 1554 12/18/2021 1639  NA 138 141 141  K 5.0 4.3 4.2  CL 107 106 108  CO2 23 25  --   GLUCOSE 140* 89 83  BUN 46* 40* 36*  CREATININE 1.65* 1.53* 1.60*  CALCIUM 8.5* 8.6*  --    GFR: Estimated Creatinine Clearance: 38.5 mL/min (A) (by C-G formula based on SCr of 1.6 mg/dL (H)). Recent Labs  Lab 11/24/21 2001 12/21/2021 1554  WBC 8.0 7.1    Liver Function Tests: Recent Labs  Lab 12/08/2021 1554  AST 31  ALT 15  ALKPHOS 101  BILITOT 0.6  PROT 6.0*  ALBUMIN 2.9*   No results for input(s): LIPASE, AMYLASE in the last 168 hours. No results for input(s): AMMONIA in the last 168 hours.  ABG    Component Value Date/Time   PHART 7.429 01/21/2021  2127   PCO2ART 39.7 01/21/2021 2127   PO2ART 111 (H) 01/21/2021 2127   HCO3 25.7 01/21/2021 2127   TCO2 29 12/21/2021 1639   ACIDBASEDEF 4.8 (H) 03/25/2019 1808   O2SAT 98.0 01/21/2021 2127     Coagulation Profile: Recent Labs  Lab 12/21/2021 1554  INR 1.0    Cardiac Enzymes: No results for input(s): CKTOTAL, CKMB, CKMBINDEX, TROPONINI in the last 168 hours.  HbA1C: Hgb A1c MFr Bld  Date/Time Value Ref Range Status  03/19/2021 05:02 PM 5.7 (H) 4.8 - 5.6 % Final    Comment:    (NOTE)         Prediabetes:  5.7 - 6.4         Diabetes: >6.4         Glycemic control for adults with diabetes: <7.0   01/26/2021 02:46 AM 6.4 (H) 4.8 - 5.6 % Final    Comment:    (NOTE) Pre diabetes:          5.7%-6.4%  Diabetes:              >6.4%  Glycemic control for   <7.0% adults with diabetes     CBG: Recent Labs  Lab 12/24/2021 1437  [place] [address]    Review of Systems:   Unable due to encephalopathy   Past Medical History:  She,  has a past medical history of Acute on chronic kidney failure ([place]), Anemia, Arthritis, Chest pain, Diverticulitis, Hypertension, Stroke ([place]), and UTI (urinary tract infection) (10/2019).   Surgical History:   Past Surgical History:  Procedure Laterality Date   BIOPSY  04/01/2019   Procedure: BIOPSY;  Surgeon: Otis Brace, MD;  Location: [place];  Service: Gastroenterology;;   BIOPSY  10/05/2019   Procedure: BIOPSY;  Surgeon: Otis Brace, MD;  Location: [place];  Service: Gastroenterology;;   BIOPSY  05/10/2020   Procedure: BIOPSY;  Surgeon: Otis Brace, MD;  Location: [facility] ENDOSCOPY;  Service: Gastroenterology;;   BIOPSY  11/29/2020   Procedure: BIOPSY;  Surgeon: Wilford Corner, MD;  Location: [place];  Service: Gastroenterology;;   COLONOSCOPY N/A 11/29/2020   Procedure: COLONOSCOPY;  Surgeon: Wilford Corner, MD;  Location: [place];  Service: Gastroenterology;  Laterality: N/A;   COLONOSCOPY WITH PROPOFOL N/A  04/01/2019   Procedure: COLONOSCOPY WITH PROPOFOL;  Surgeon: Otis Brace, MD;  Location: [place];  Service: Gastroenterology;  Laterality: N/A;   COLONOSCOPY WITH PROPOFOL N/A 10/05/2019   Procedure: COLONOSCOPY WITH PROPOFOL;  Surgeon: Otis Brace, MD;  Location: [place];  Service: Gastroenterology;  Laterality: N/A;   COLONOSCOPY WITH PROPOFOL N/A 05/10/2020   Procedure: COLONOSCOPY WITH PROPOFOL;  Surgeon: Otis Brace, MD;  Location: [place];  Service: Gastroenterology;  Laterality: N/A;   ESOPHAGOGASTRODUODENOSCOPY (EGD) WITH PROPOFOL N/A 04/01/2019   Procedure: ESOPHAGOGASTRODUODENOSCOPY (EGD) WITH PROPOFOL;  Surgeon: Otis Brace, MD;  Location: [place];  Service: Gastroenterology;  Laterality: N/A;   ESOPHAGOGASTRODUODENOSCOPY (EGD) WITH PROPOFOL N/A 10/05/2019   Procedure: ESOPHAGOGASTRODUODENOSCOPY (EGD) WITH PROPOFOL;  Surgeon: Otis Brace, MD;  Location: [place];  Service: Gastroenterology;  Laterality: N/A;   POLYPECTOMY  11/29/2020   Procedure: POLYPECTOMY;  Surgeon: Wilford Corner, MD;  Location: [facility] ENDOSCOPY;  Service: Gastroenterology;;   SUBMUCOSAL TATTOO INJECTION  11/29/2020   Procedure: SUBMUCOSAL TATTOO INJECTION;  Surgeon: Wilford Corner, MD;  Location: [facility] ENDOSCOPY;  Service: Gastroenterology;;   TONSILLECTOMY       Social History:  Unable   Family History:  Unable   Allergies Allergies  Allergen Reactions   Atorvastatin Nausea And Vomiting     Home Medications  Prior to Admission medications   Medication Sig Start Date End Date Taking? Authorizing Provider  carvedilol (COREG) 25 MG tablet Take 1 tablet (25 mg total) by mouth 2 (two) times daily. 01/03/21   Fargo, Amy E, NP  clopidogrel (PLAVIX) 75 MG tablet Take 1 tablet (75 mg total) by mouth daily. 03/12/21   Garvin Fila, MD  diclofenac Sodium (VOLTAREN) 1 % GEL Apply 1 application topically daily as needed (for muscle or joint pain). 09/17/21   [provider]  diltiazem (CARDIZEM CD) 180 MG 24 hr capsule Take 180 mg by mouth daily.  04/01/21   [provider]  DULoxetine (CYMBALTA) 60 MG capsule Take 1 capsule (60 mg total) by mouth at bedtime. 01/03/21   Fargo, Amy E, NP  folic acid (FOLVITE) 1 MG tablet Take 1 mg by mouth daily. 10/11/21   [provider]  hydroxychloroquine (PLAQUENIL) 200 MG tablet Take 1 tablet (200 mg total) by mouth daily. Patient taking differently: Take 200 mg by mouth 2 (two) times daily. 01/03/21   Fargo, Amy E, NP  leflunomide (ARAVA) 20 MG tablet Take 20 mg by mouth daily. 05/27/21   [provider]  Multiple Vitamins-Minerals (CENTRUM WOMEN) TABS Take 1 tablet by mouth daily.    [provider]  oxyCODONE-acetaminophen (PERCOCET) 7.5-325 MG tablet Take 1 tablet by mouth every 6 (six) hours as needed for severe pain or moderate pain. 10/23/21   Charlesetta Shanks, MD  oxyCODONE-acetaminophen (PERCOCET/ROXICET) 5-325 MG tablet Take 1 tablet by mouth 2 (two) times daily as needed for moderate pain. 10/03/21   [provider]  pantoprazole (PROTONIX) 40 MG tablet Take 1 tablet (40 mg total) by mouth daily. Patient taking differently: Take 40 mg by mouth daily as needed (for acid reflux). 01/03/21   Yvonna Alanis, NP  pregabalin (LYRICA) 50 MG capsule Take by mouth. 11/19/21   [provider]  rosuvastatin (CRESTOR) 10 MG tablet Take 10 mg by mouth daily.    [provider]  traZODone (DESYREL) 50 MG tablet Take 50 mg by mouth at bedtime as needed for sleep. 10/08/21   [provider]     Critical care time: 60 mins     Kennieth Rad, ACNP [place] [facility] 11/29/2021, 5:20 PM

## 2021-11-27 NOTE — Progress Notes (Signed)
Pt to [facility] ICU. Preparing to intubate. CCM at bedside. Cryop infusion in progress

## 2021-11-27 NOTE — Progress Notes (Signed)
Pt unable to follow commands. Drowsy. Neurologist notified. Pt to be taken for stat CT head.

## 2021-11-27 NOTE — Progress Notes (Signed)
Pt now not moving rt side. MD aware.

## 2021-11-27 NOTE — Progress Notes (Signed)
Patient ID: Michelle MallingBarbara J , female   DOB: 08/27/1951, 71 y.o.   MRN: [id number] 71 year old female with tPA induced intraparenchymal hemorrhage measuring approximately 11 to 12 mm in the pons this is not causing significant mass effect it is not needing any neurosurgical intervention.

## 2021-11-27 NOTE — ED Notes (Signed)
Pt transported to CT per neuro change.

## 2021-11-27 NOTE — ED Triage Notes (Addendum)
Pt BIB EMS after being called by patient's PT d/t patient not answering door. EMS reports patient was A&Ox1 on arrival, would not answer questions but would look when her name was called. LKW was 11-26-21 0900. Initial GCS 10, currently 15. Orientation improved to A&Ox4 currently. Patient takes narcotics and sleeping meds.

## 2021-11-27 NOTE — Progress Notes (Signed)
Brief Neuro Update:  Briefly, [name] is a 71 y.o. female who is status post TNKase for severe aphasia.  She became less responsive in the afternoon and CT head demonstrated acute left low midbrain/upper pons hemorrhage.  She was given cryoprecipitate and blood pressure was brought down below 140.  Fibrinogen levels were 569 and repeat fibrinogen levels last night at 646.  She had repeat CT head without contrast which demonstrated marked worsening of the pontine and midbrain parenchymal hemorrhage which now involves the majority of the brainstem and extending into the ventricular system leading to acute hydrocephalus.  On my exam performed after holding sedation for 10 minutes, patient has fixed midsize round pupils bilaterally.  She has intact cough but no gag, no corneals.  He has no spontaneous movements in any extremities.  She does not localize or withdraw to pain in bilateral upper extremities.  She has triple flexion in bilateral lower extremities.  I called and I discussed the imaging findings with patient's daughter Michelle Graves. The large brainstem ICH is a devastating injury and the loss of brainstem function is likely incompatible with life. I revisited the code status discussion with Michelle Graves and for now, patient is FULL CODE. She understands the severity of the brainstem injury and at this time wants to discuss with the rest of the family in morning and in the meantime, patient is full code. She wants family to see her for last time.  As for her hydrocephalus, will start her on hypertonic saline as a temporizing measure for now. I do not think that she is going to be a candidate for temporary EVD. EVD may help with the developing hydrocephalus but it is not going to change the fact that she has a large brainstem ICH.  This patient is critically ill and at significant risk of neurological worsening, death and care requires constant monitoring of vital signs,  hemodynamics,respiratory and cardiac monitoring, neurological assessment, discussion with family, other specialists and medical decision making of high complexity. I spent 35 minutes of neurocritical care time  in the care of  this patient. This was time spent independent of any time provided by nurse practitioner or PA.  [place] Pager Number [phone] 11/28/2021  1:13 AM   [place] Pager Number [phone]

## 2021-11-27 NOTE — Procedures (Signed)
Intubation Procedure Note  Michelle Graves  [id number]  06-02-1951  Date:12/09/2021  Time:5:18 PM   Provider Performing:Brooke Moshe Cipro    Procedure: Intubation ([id number])  Indication(s) Respiratory Failure  Consent Unable to obtain consent due to emergent nature of procedure.   Anesthesia Etomidate, Versed, Fentanyl, and Succinylcholine  Dr. Lynetta Mare at bedside  Time Out Verified patient identification, verified procedure, site/side was marked, verified correct patient position, special equipment/implants available, medications/allergies/relevant history reviewed, required imaging and test results available.   Sterile Technique Usual hand hygeine, masks, and gloves were used   Procedure Description Patient positioned in bed supine.  Sedation given as noted above.  Patient was intubated with endotracheal tube using Glidescope.  View was Grade 1 full glottis  but anterior.  Number of attempts was  2 .  Colorimetric CO2 detector was consistent with tracheal placement.  7.5 ETT placed at 24 at lip.  Upper and lower dentures removed and given to bedside RN.     Complications/Tolerance None; patient tolerated the procedure well. Chest X-ray is ordered to verify placement.   EBL N/a   Specimen(s) None    Kennieth Rad, ACNP [place] [facility] 12/07/2021, 5:18 PM

## 2021-11-27 NOTE — H&P (Signed)
Neurology H&P  Michelle ARRISON MR# [id number] 11/29/2021   CC: aphasia  History is obtained from: Nursing staff and chart.  HPI: Michelle Graves is a 71 y.o. female PMHx as reviewed below was not answering her door and EMS found patient A&Ox1 on arrival, not answering questions but would look when her name was called. LKW was 11-26-21 0900. On arrival patient improved and was A&Ox4. ~1415 while in triage, she became severely aphasic and was transported to CT.  Patient lives alone. Patient takes narcotics and sleeping meds.    LKW: [id number] tNK given: yes IR Thrombectomy No Modified Rankin Scale: 0-Completely asymptomatic and back to baseline post- stroke NIHSS: 2 severe aphasia   ROS: Unable to assess due to encephalopathy.  Past Medical History:  Diagnosis Date   Acute on chronic kidney failure (HCC)    Anemia    Arthritis    Chest pain    Diverticulitis    Hypertension    Stroke [facility])    UTI (urinary tract infection) 10/2019     Family History  Problem Relation Age of Onset   Hypertension Mother    Diabetes Mother    CAD Father        died of MI at age 29   Hypertension Father    Diabetes Sister    Diabetes Sister    Kidney disease Neg Hx    Adrenal disorder Neg Hx     Social History:  reports that she has never smoked. She has never been exposed to tobacco smoke. She has never used smokeless tobacco. She reports that she does not currently use alcohol. She reports that she does not use drugs.   Prior to Admission medications   Medication Sig Start Date End Date Taking? Authorizing Provider  carvedilol (COREG) 25 MG tablet Take 1 tablet (25 mg total) by mouth 2 (two) times daily. 01/03/21   Fargo, Amy E, NP  clopidogrel (PLAVIX) 75 MG tablet Take 1 tablet (75 mg total) by mouth daily. 03/12/21   Garvin Fila, MD  diclofenac Sodium (VOLTAREN) 1 % GEL Apply 1 application topically daily as needed (for muscle or joint pain). 09/17/21   [provider]   diltiazem (CARDIZEM CD) 180 MG 24 hr capsule Take 180 mg by mouth daily. 04/01/21   [provider]  DULoxetine (CYMBALTA) 60 MG capsule Take 1 capsule (60 mg total) by mouth at bedtime. 01/03/21   Fargo, Amy E, NP  folic acid (FOLVITE) 1 MG tablet Take 1 mg by mouth daily. 10/11/21   [provider]  hydroxychloroquine (PLAQUENIL) 200 MG tablet Take 1 tablet (200 mg total) by mouth daily. Patient taking differently: Take 200 mg by mouth 2 (two) times daily. 01/03/21   Fargo, Amy E, NP  leflunomide (ARAVA) 20 MG tablet Take 20 mg by mouth daily. 05/27/21   [provider]  Multiple Vitamins-Minerals (CENTRUM WOMEN) TABS Take 1 tablet by mouth daily.    [provider]  oxyCODONE-acetaminophen (PERCOCET) 7.5-325 MG tablet Take 1 tablet by mouth every 6 (six) hours as needed for severe pain or moderate pain. 10/23/21   Charlesetta Shanks, MD  oxyCODONE-acetaminophen (PERCOCET/ROXICET) 5-325 MG tablet Take 1 tablet by mouth 2 (two) times daily as needed for moderate pain. 10/03/21   [provider]  pantoprazole (PROTONIX) 40 MG tablet Take 1 tablet (40 mg total) by mouth daily. Patient taking differently: Take 40 mg by mouth daily as needed (for acid reflux). 01/03/21   Cleophas Dunker, Amy  E, NP  pregabalin (LYRICA) 50 MG capsule Take by mouth. 11/19/21   [provider]  rosuvastatin (CRESTOR) 10 MG tablet Take 10 mg by mouth daily.    [provider]  traZODone (DESYREL) 50 MG tablet Take 50 mg by mouth at bedtime as needed for sleep. 10/08/21   [provider]    Exam: Current vital signs: BP (!) 169/101    Pulse 79    Temp 98.6 F (37 C)    Resp 20    Wt 91 kg    SpO2 100%    BMI 31.42 kg/m   Physical Exam  Constitutional: Appears well-developed and well-nourished.  Psych: Affect appropriate to situation Eyes: No scleral injection HENT: No OP obstruction. Head: Normocephalic.  Cardiovascular: Normal rate and regular rhythm.   Respiratory: Effort normal, symmetric excursions bilaterally, no audible wheezing. GI: Soft.  No distension. There is no tenderness.  Skin: WDI  Neuro: Mental Status: Patient is awake, alert, oriented to person, place and situation. Patient is not able to give a clear and coherent history. Speech severely impaired fluency, intact comprehension and impaired repetition. Visual Fields are full. Pupils are equal, round, and reactive to light. EOMI without ptosis or diploplia.  Facial sensation is symmetric to temperature Facial movement is symmetric.  Hearing is intact to voice. Uvula midline and palate elevates symmetrically. Shoulder shrug is symmetric. Tongue is midline without atrophy or fasciculations.  Tone is normal. Bulk is normal. 5/5 strength was present in all four extremities. Sensation is symmetric to light touch and temperature in the arms and legs. Deep Tendon Reflexes: 2+ and symmetric in the biceps and patellae. Toes are downgoing bilaterally. FNF and HKS are intact bilaterally. Gait - Deferred  I have reviewed labs in epic and the pertinent results are: Ca 8.5  I have reviewed the images obtained: NCT head showed no acute intracranial hemorrhage or evidence of acute infarction. ASPECT score is 10. CTA head and neck showed The common carotid, internal carotid and vertebral arteries are patent within the neck without stenosis. 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  Assessment: Michelle Graves is a 71 y.o. female PMHx as noted above with acute onset severe expressive aphasia. The patient lives alone and functional at baseline without contraindications to thrombolytics and she was deemed a candidate.   There was delay in administration due to very difficult IV access.  Plan: Patient administered tPA/tNK, with no contraindications to  administration.   During 1 hour tPA infusion: Neurologic assessment every 15 minutes during the tNK infusion Check for major and/or minor bleeding Monitor blood pressure every 15 minutes Monitor for signs of intracranial hemorrhage (ICH) Monitor for signs of orolingual angioedema Discontinue infusion and obtain an emergency CT scan if the patient develops severe headache, acute hypertension, nausea, or vomiting, or has a worsening neurologic examination.   Plan: Admit to ICU for 24 hours with frequent neuro checks.  Post tNK blood pressure: <180/157mm Hg for first 24 hours the gradual reduction over next few days. Maintain O2 sats > 94%. Normothermia - For temperature >37.5C - acetaminophen 650mg  q4-6 hours PRN. CT head in 6 hours if patient has not had MRI brain. No antiplatelet medications or anticoagulants for 24 hours following tNK and after imaging CT/MRI. Blood pressure management per post thrombolytic protocol order set. Gentle IV hydration.  Relative euglycemia and treat hyperglycemia (>200 mg/dL)/hypoglycemia (<  60mg /dL). PT/OT/Speech. MRI of brain, MRA of head, TTE.  Telemetry monitoring for arrhythmia. Bedside swallow screen. Stroke education. Recommend PT/OT/SLP consult. If there is acute neurologic decline STAT CT head.  PPx:     GI - H2     DVT - SCDs for now. Precautions: Aspiration/seizure/fall.  This patient is critically ill and at significant risk of neurological worsening, death and care requires constant monitoring of vital signs, hemodynamics,respiratory and cardiac monitoring, neurological assessment, discussion with family, other specialists and medical decision making of high complexity. I spent 73 minutes of neurocritical care time  in the care of  this patient. This was time spent independent of any time provided by nurse practitioner or PA.  Electronically signed by:  Lynnae Sandhoff, MD Page: [id number] 12/05/2021, 3:34 PM  If 7pm- 7am, please page  neurology on call as listed in [place].

## 2021-11-27 NOTE — Progress Notes (Signed)
PHARMACIST CODE STROKE RESPONSE  Notified to mix TNK at 1443 by Dr. Theda Sers Delivered TNK to RN at 1447  TNK dose = 23 mg IV over 5 seconds.   Issues/delays encountered (if applicable): Patient weight was obtained after MD order for tenecteplase   Albertina Parr, PharmD., BCCCP Clinical Pharmacist Please refer to [facility] for unit-specific pharmacist

## 2021-11-27 NOTE — Code Documentation (Addendum)
[name] is a 71 yr old female with h/o CRI, HTN and prior CVA. She is on plavix. Michelle Graves was not answering her door for home PT appointment. EMS was contacted, and when they arrived she was aphasic. Per EDRN, EMS reported a total resolution of symptoms en route to [facility]. Pt arrived to [facility] at 1338. Pt was noted to be aphasic again by [facility] and code stroke was activated at 1425. Pt was in CT at that time. Stroke team to CT at 1430. CTNC had been performed, and was negative for acute hemorrhage per Dr Theda Sers. CBG, BP, assessed in CT scanner.Pt remains non-focal, but very aphasic. TNK ordered and given at 1452. Additional IV access obtained. CTA performed, which was neg for ELVO per Dr Theda Sers. Pt returned to ED room 26. Pt will need q 15 min VS and mNIHSS for 2 hrs, then q 30 min for 6 hrs, then hourly. Bedside handoff with Paden complete.

## 2021-11-27 NOTE — Progress Notes (Signed)
Cryo will be ready in 15 min per blood bank

## 2021-11-27 NOTE — Progress Notes (Signed)
Post-thrombolytic reversal protocol in progress. Goal BP 130-150. Clevidipine infusing. TXA cryo have been ordered.

## 2021-11-27 NOTE — Progress Notes (Signed)
eLink Physician-Brief Progress Note Patient Name: Michelle MallingBarbara Graves  DOB: 10/23/1951 MRN: [id number]   Date of Service  12/13/2021  HPI/Events of Note  Hypocalcemia   Hypomagnesemia - Mg++ = 1.7. ionized Ca++ = 0 85 and Creatinine - 1.60.   eICU Interventions  Will replace Ca++ and Mg++.     Intervention Category Major Interventions: Electrolyte abnormality - evaluation and management  Helina Hullum Eugene 12/11/2021, 11:10 PM

## 2021-11-27 NOTE — Progress Notes (Signed)
RT transported pt to and from CT RM 2 from 4N23 w/o complication. RT will cont to monitor.

## 2021-11-27 NOTE — Progress Notes (Signed)
Updated pt's daughter [name] on pt's location, today's events, and current plan of care. Neurology MD to also update her.

## 2021-11-28 ENCOUNTER — Inpatient Hospital Stay (HOSPITAL_COMMUNITY): Payer: Medicare PPO

## 2021-11-28 DIAGNOSIS — I6389 Other cerebral infarction: Secondary | ICD-10-CM

## 2021-11-28 DIAGNOSIS — I639 Cerebral infarction, unspecified: Secondary | ICD-10-CM

## 2021-11-28 LAB — CBC
HCT: 35.2 % — ABNORMAL LOW (ref 36.0–46.0)
Hemoglobin: 11.7 g/dL — ABNORMAL LOW (ref 12.0–15.0)
MCH: 31 pg (ref 26.0–34.0)
MCHC: 33.2 g/dL (ref 30.0–36.0)
MCV: 93.4 fL (ref 80.0–100.0)
Platelets: 205 10*3/uL (ref 150–400)
RBC: 3.77 MIL/uL — ABNORMAL LOW (ref 3.87–5.11)
RDW: 14.8 % (ref 11.5–15.5)
WBC: 7 10*3/uL (ref 4.0–10.5)
nRBC: 0 % (ref 0.0–0.2)

## 2021-11-28 LAB — BPAM CRYOPRECIPITATE
Blood Product Expiration Date: 202302012209
Blood Product Expiration Date: 202302012209
ISSUE DATE / TIME: 202302011635
ISSUE DATE / TIME: 202302011635
Unit Type and Rh: 5100
Unit Type and Rh: 5100

## 2021-11-28 LAB — ECHOCARDIOGRAM COMPLETE
Area-P 1/2: 4.21 cm2
Calc EF: 74 %
Height: 67 in
Single Plane A2C EF: 78 %
Single Plane A4C EF: 69.6 %
Weight: 3209.9 oz

## 2021-11-28 LAB — COMPREHENSIVE METABOLIC PANEL
ALT: 16 U/L (ref 0–44)
AST: 29 U/L (ref 15–41)
Albumin: 3 g/dL — ABNORMAL LOW (ref 3.5–5.0)
Alkaline Phosphatase: 95 U/L (ref 38–126)
Anion gap: 15 (ref 5–15)
BUN: 33 mg/dL — ABNORMAL HIGH (ref 8–23)
CO2: 19 mmol/L — ABNORMAL LOW (ref 22–32)
Calcium: 9.5 mg/dL (ref 8.9–10.3)
Chloride: 115 mmol/L — ABNORMAL HIGH (ref 98–111)
Creatinine, Ser: 1.55 mg/dL — ABNORMAL HIGH (ref 0.44–1.00)
GFR, Estimated: 36 mL/min — ABNORMAL LOW (ref >60)
Glucose, Bld: 101 mg/dL — ABNORMAL HIGH (ref 70–99)
Potassium: 4 mmol/L (ref 3.5–5.1)
Sodium: 149 mmol/L — ABNORMAL HIGH (ref 135–145)
Total Bilirubin: 0.6 mg/dL (ref 0.3–1.2)
Total Protein: 6.6 g/dL (ref 6.5–8.1)

## 2021-11-28 LAB — PREPARE CRYOPRECIPITATE
Unit division: 0
Unit division: 0

## 2021-11-28 LAB — LIPID PANEL
Cholesterol: 111 mg/dL (ref 0–200)
HDL: 52 mg/dL (ref >40)
LDL Cholesterol: 40 mg/dL (ref 0–99)
Total CHOL/HDL Ratio: 2.1 RATIO
Triglycerides: 96 mg/dL (ref <150)
VLDL: 19 mg/dL (ref 0–40)

## 2021-11-28 LAB — GLUCOSE, CAPILLARY
Glucose-Capillary: 105 mg/dL — ABNORMAL HIGH (ref 70–99)
Glucose-Capillary: 105 mg/dL — ABNORMAL HIGH (ref 70–99)
Glucose-Capillary: 106 mg/dL — ABNORMAL HIGH (ref 70–99)
Glucose-Capillary: 127 mg/dL — ABNORMAL HIGH (ref 70–99)
Glucose-Capillary: 130 mg/dL — ABNORMAL HIGH (ref 70–99)
Glucose-Capillary: 146 mg/dL — ABNORMAL HIGH (ref 70–99)
Glucose-Capillary: 150 mg/dL — ABNORMAL HIGH (ref 70–99)

## 2021-11-28 LAB — TRIGLYCERIDES: Triglycerides: 95 mg/dL (ref <150)

## 2021-11-28 LAB — SODIUM
Sodium: 156 mmol/L — ABNORMAL HIGH (ref 135–145)
Sodium: 158 mmol/L — ABNORMAL HIGH (ref 135–145)
Sodium: 158 mmol/L — ABNORMAL HIGH (ref 135–145)

## 2021-11-28 MED ORDER — SODIUM CHLORIDE 3 % IV SOLN
INTRAVENOUS | Status: DC
  Filled 2021-11-28 (×2): qty 500

## 2021-11-28 MED ORDER — CHLORHEXIDINE GLUCONATE CLOTH 2 % EX PADS
6.0000 | MEDICATED_PAD | Freq: Every day | CUTANEOUS | Status: DC
  Administered 2021-11-28 – 2021-12-03 (×6): 6 via TOPICAL

## 2021-11-28 NOTE — Progress Notes (Signed)
OT Cancellation Note  Patient Details Name: Michelle MallingBarbara J  MRN: [id number] DOB: 08/09/1951   Cancelled Treatment:    Reason Eval/Treat Not Completed: Patient not medically ready.  RN advised OT sign off given medical status.    Phillis Thackeray D Najae Rathert 11/28/2021, 10:23 AM

## 2021-11-28 NOTE — Progress Notes (Signed)
NA 158. Hypertonic sodium gtt stopped per order.

## 2021-11-28 NOTE — Progress Notes (Signed)
Noon BMET "in process" at 1, lab came to draw at 1330, resulted high  (156) at two. Order received to decrease rate to 50, and recheck stat BMET.

## 2021-11-28 NOTE — Progress Notes (Addendum)
STROKE TEAM PROGRESS NOTE   INTERVAL HISTORY Patient is seen in her room with her daughter and son at the bedside.  Yesterday, she was brought to the ED with confusion and aphasia.  TNK was administered, but unfortunately, patient developed an acute hemorrhage in the lower midbrain and upper pons.  Cryoprecipitate was given, but hemorrhage was noted to be enlarged on follow up CT. patient neurological exam remains quite poor with absent pupillary reflex and weak cough and gag no need trace withdrawal in extremities with pain. .  Prognosis is poor, and had long discussion about this with patient's children regarding goals of care.    Vitals:   11/28/21 1045 11/28/21 1100 11/28/21 1115 11/28/21 1130  BP: 130/85 (!) 142/86 133/89 (!) 134/91  Pulse: 93 95 94   Resp: 20 20 20 20   Temp:      TempSrc:      SpO2: 100% 100% 100%   Weight:      Height:       CBC:  Recent Labs  Lab 11/24/21 2001 12/12/2021 1554 12/09/2021 1639 12/02/2021 1834 11/28/21 0607  WBC 8.0 7.1  --   --  7.0  NEUTROABS 4.9 3.7  --   --   --   HGB 10.5* 10.3*   < > 10.2* 11.7*  HCT 32.1* 32.3*   < > 30.0* 35.2*  MCV 95.0 98.2  --   --  93.4  PLT 181 177  --   --  205   < > = values in this interval not displayed.   Basic Metabolic Panel:  Recent Labs  Lab 12/19/2021 1554 12/08/2021 1639 12/20/2021 1834 12/04/2021 2019 11/28/21 0607  NA 141 141 144  --  149*  K 4.3 4.2 3.8  --  4.0  CL 106 108  --   --  115*  CO2 25  --   --   --  19*  GLUCOSE 89 83  --   --  101*  BUN 40* 36*  --   --  33*  CREATININE 1.53* 1.60*  --   --  1.55*  CALCIUM 8.6*  --   --   --  9.5  MG  --   --   --  1.7  --    Lipid Panel:  Recent Labs  Lab 11/28/21 0435  CHOL 111  TRIG 96   95  HDL 52  CHOLHDL 2.1  VLDL 19  LDLCALC 40   HgbA1c: No results for input(s): HGBA1C in the last 168 hours. Urine Drug Screen: No results for input(s): LABOPIA, COCAINSCRNUR, LABBENZ, AMPHETMU, THCU, LABBARB in the last 168 hours.  Alcohol Level   Recent Labs  Lab 12/20/2021 1403  ETH <10    IMAGING past 24 hours CT HEAD WO CONTRAST  Result Date: 12/09/2021 CLINICAL DATA:  Stroke, hemorrhagic EXAM: CT HEAD WITHOUT CONTRAST TECHNIQUE: Contiguous axial images were obtained from the base of the skull through the vertex without intravenous contrast. RADIATION DOSE REDUCTION: This exam was performed according to the departmental dose-optimization program which includes automated exposure control, adjustment of the mA and/or kV according to patient size and/or use of iterative reconstruction technique. COMPARISON:  CT head 12/01/2021 FINDINGS: Brain: Patchy and confluent areas of decreased attenuation are noted throughout the deep and periventricular white matter of the cerebral hemispheres bilaterally, compatible with chronic microvascular ischemic disease. No evidence of large-territorial acute infarction. Interval increase in parenchymal hemorrhage now involving the midbrain and pons. Interval extension into the ventricular  system with almost complete opacification of the third and fourth ventricles. Some blood products noted within the frontal horns of the lateral ventricles. Associated new hydrocephalus with associated vasogenic edema of the surrounding white matter. No midline shift. No hydrocephalus. Interval partial effacement of the cerebellar fissures. Basilar cisterns are effaced with concern of downward herniation. Vascular: No hyperdense vessel. Atherosclerotic calcifications are present within the cavernous internal carotid arteries. Skull: No acute fracture or focal lesion. Sinuses/Orbits: Paranasal sinuses and mastoid air cells are clear. The orbits are unremarkable. Other: None. IMPRESSION: Marked interval worsening of a pontine and mid brain parenchymal hemorrhage involving the majority of the brainstem and now extending into the ventricular system leading to acute hydrocephalus. These results were called by telephone at the time of  interpretation on 12/13/2021 at 11:31 pm to provider Dr. Milas Gain, who verbally acknowledged these results. Electronically Signed   By: Iven Finn M.D.   On: 12/02/2021 23:38   CT Head Wo Contrast  Result Date: 12/17/2021 CLINICAL DATA:  Headache, new or worsening, neuro deficit (Age 48-49y) neuro change EXAM: CT HEAD WITHOUT CONTRAST TECHNIQUE: Contiguous axial images were obtained from the base of the skull through the vertex without intravenous contrast. RADIATION DOSE REDUCTION: This exam was performed according to the departmental dose-optimization program which includes automated exposure control, adjustment of the mA and/or kV according to patient size and/or use of iterative reconstruction technique. COMPARISON:  Same day CT FINDINGS: Brain: Acute 7 x 11 x 10 mm intraparenchymal hemorrhage in the left aspect of the pons/midbrain. No evidence of acute large vascular territory infarct. Patchy white matter hypoattenuation, nonspecific but compatible with chronic microvascular disease. No hydrocephalus. Similar atrophy with ex vacuo ventricular dilation. No midline shift. Vascular: Retained vasculature recent CTA. Please see that study for evaluation of vasculature. Skull: No acute fracture. Sinuses/Orbits: Clear sinuses.  Unremarkable orbits. Other: No mastoid effusions. IMPRESSION: Acute 11 mm intraparenchymal hemorrhage in the left aspect of the pons/midbrain. Findings discussed with Dr. Theda Sers via telephone at 3:58 p.m. Electronically Signed   By: Margaretha Sheffield M.D.   On: 12/14/2021 16:00   DG Chest Port 1 View  Result Date: 12/02/2021 CLINICAL DATA:  Intubation at bedside. EXAM: PORTABLE CHEST 1 VIEW COMPARISON:  Chest radiograph dated June 27, 2021 FINDINGS: The heart is enlarged. Aorta is ectatic. Low lung volumes. Bibasilar atelectasis. Endotracheal tube with distal tip approximately 2.5 cm above the carina. Feeding tube coursing below the diaphragm with distal tip not included. No acute  osseous abnormality. IMPRESSION: 1. Cardiomegaly. 2. Low lung volumes. Bibasilar atelectasis. No large pleural effusion or pneumothorax. 3. Endotracheal tube with distal tip approximately 2.5 cm above the carina. Feeding tube coursing below the diaphragm with distal tip not included. Electronically Signed   By: Keane Police D.O.   On: 12/21/2021 17:56   ECHOCARDIOGRAM COMPLETE  Result Date: 11/28/2021    ECHOCARDIOGRAM REPORT   Patient Name:   TIEESHA WOFFORD Date of Exam: 11/28/2021 Medical Rec #:  [id number]         Height:       67.0 in Accession #:    [id number]        Weight:       200.6 lb Date of Birth:  12-15-1950          BSA:          2.025 m Patient Age:    88 years          BP:  147/92 mmHg Patient Gender: F                 HR:           78 bpm. Exam Location:  Inpatient Procedure: 2D Echo, Cardiac Doppler and Color Doppler Indications:    Stroke  History:        Patient has prior history of Echocardiogram examinations, most                 recent 12/16/2020. Abnormal ECG, Stroke and TIA,                 Arrythmias:Tachycardia, Signs/Symptoms:Hypotension and Altered                 Mental Status; Risk Factors:Hypertension.  Sonographer:    Roseanna Rainbow RDCS Referring Phys: [id number] [place]  Sonographer Comments: Technically difficult study due to poor echo windows, Technically challenging study due to limited acoustic windows, suboptimal parasternal window, suboptimal apical window and echo performed with patient supine and on artificial respirator. Image acquisition challenging due to patient body habitus and Image acquisition challenging due to respiratory motion. Extremely difficult images. Supine on vent. IMPRESSIONS  1. Small LV cavity with near cavity obliteration during systole. Peak gradient through the LV is 30 mm Hg. Left ventricular ejection fraction, by estimation, is 70 to 75%. The left ventricle has hyperdynamic function.  2. Right ventricular systolic function is normal. The  right ventricular size is normal. Comparison(s): The left ventricular function is unchanged. FINDINGS  Left Ventricle: Small LV cavity with near cavity obliteration during systole. Peak gradient through the LV is 30 mm Hg. Left ventricular ejection fraction, by estimation, is 70 to 75%. The left ventricle has hyperdynamic function. The left ventricular internal cavity size was small. Left ventricular diastolic parameters are consistent with Grade I diastolic dysfunction (impaired relaxation). Right Ventricle: The right ventricular size is normal. Right vetricular wall thickness was not assessed. Right ventricular systolic function is normal. Left Atrium: Left atrial size was normal in size. Pericardium: There is no evidence of pericardial effusion. Tricuspid Valve: The tricuspid valve is grossly normal. Tricuspid valve regurgitation is not demonstrated. Aortic Valve: The aortic valve was not well visualized. Aortic valve regurgitation is trivial. Aortic valve sclerosis is present, with no evidence of aortic valve stenosis. Pulmonic Valve: The pulmonic valve was not well visualized. Pulmonic valve regurgitation is not visualized. Aorta: The aortic root is normal in size and structure. IAS/Shunts: The interatrial septum was not assessed.  LEFT VENTRICLE PLAX 2D LVOT diam:     2.30 cm     Diastology LV SV:         76          LV e' medial:    3.24 cm/s LV SV Index:   37          LV E/e' medial:  15.7 LVOT Area:     4.15 cm    LV e' lateral:   5.11 cm/s                            LV E/e' lateral: 10.0  LV Volumes (MOD) LV vol d, MOD A2C: 24.1 ml LV vol d, MOD A4C: 41.5 ml LV vol s, MOD A2C: 5.3 ml LV vol s, MOD A4C: 12.6 ml LV SV MOD A2C:     18.8 ml LV SV MOD A4C:     41.5 ml LV SV MOD  BP:      24.6 ml RIGHT VENTRICLE            IVC RV S prime:     9.24 cm/s  IVC diam: 1.50 cm TAPSE (M-mode): 1.3 cm LEFT ATRIUM           Index        RIGHT ATRIUM          Index LA Vol (A2C): 12.2 ml 6.02 ml/m   RA Area:     7.08 cm  LA Vol (A4C): 33.5 ml 16.54 ml/m  RA Volume:   12.10 ml 5.98 ml/m  AORTIC VALVE LVOT Vmax:   115.00 cm/s LVOT Vmean:  72.200 cm/s LVOT VTI:    0.182 m MITRAL VALVE MV Area (PHT): 4.21 cm    SHUNTS MV Decel Time: 180 msec    Systemic VTI:  0.18 m MV E velocity: 50.90 cm/s  Systemic Diam: 2.30 cm MV A velocity: 87.50 cm/s MV E/A ratio:  0.58 Dorris Carnes MD Electronically signed by Dorris Carnes MD Signature Date/Time: 11/28/2021/11:46:29 AM    Final    CT HEAD CODE STROKE WO CONTRAST  Result Date: 12/08/2021 CLINICAL DATA:  Code stroke.  Neuro deficit, acute, stroke suspected EXAM: CT HEAD WITHOUT CONTRAST TECHNIQUE: Contiguous axial images were obtained from the base of the skull through the vertex without intravenous contrast. RADIATION DOSE REDUCTION: This exam was performed according to the departmental dose-optimization program which includes automated exposure control, adjustment of the mA and/or kV according to patient size and/or use of iterative reconstruction technique. COMPARISON:  06/27/2021 FINDINGS: Brain: There is no acute intracranial hemorrhage, mass effect, or edema. No new loss of gray-white differentiation. Confluent areas of low-density in the supratentorial white matter are nonspecific but probably reflect similar moderate to marked chronic microvascular ischemic changes. Prominence of the ventricles and sulci reflects similar parenchymal volume loss. No extra-axial collection. Vascular: No hyperdense vessel. Intracranial atherosclerotic calcification at the skull base. Skull: Unremarkable. Sinuses/Orbits: No acute abnormality. Other: Mastoid air cells are clear. ASPECTS ([place] Stroke Program Early CT Score) - Ganglionic level infarction (caudate, lentiform nuclei, internal capsule, insula, M1-M3 cortex): 7 - Supraganglionic infarction (M4-M6 cortex): 3 Total score (0-10 with 10 being normal): 10 IMPRESSION: There is no acute intracranial hemorrhage or evidence of acute infarction. ASPECT score  is 10. These results were communicated to Dr. Theda Sers at 2:32 pm on 12/08/2021 by text page via the [facility] messaging system. Electronically Signed   By: Macy Mis M.D.   On: 12/08/2021 14:34   CT ANGIO HEAD NECK W WO CM (CODE STROKE)  Result Date: 12/09/2021 CLINICAL DATA:  Neuro deficit, acute, stroke suspected. Acute aphasia. Altered mental status. Stroke. EXAM: CT ANGIOGRAPHY HEAD AND NECK TECHNIQUE: Multidetector CT imaging of the head and neck was performed using the standard protocol during bolus administration of intravenous contrast. Multiplanar CT image reconstructions and MIPs were obtained to evaluate the vascular anatomy. Carotid stenosis measurements (when applicable) are obtained utilizing NASCET criteria, using the distal internal carotid diameter as the denominator. RADIATION DOSE REDUCTION: This exam was performed according to the departmental dose-optimization program which includes automated exposure control, adjustment of the mA and/or kV according to patient size and/or use of iterative reconstruction technique. CONTRAST:  9mL OMNIPAQUE IOHEXOL 350 MG/ML SOLN COMPARISON:  Same day head CT 12/20/2021. MRI brain 03/20/2021. MRA head 03/27/2021 (images available, report unavailable). CT angiogram head/neck 03/19/2021. Thyroid ultrasounds 03/04/2021 and 03/08/2021. FINDINGS: CTA NECK FINDINGS Aortic arch: Common origin of the  innominate and left common carotid arteries. Atherosclerotic plaque within the visualized aortic arch and proximal major branch vessels of the neck. No hemodynamically significant innominate or proximal subclavian artery stenosis. Right carotid system: CCA and ICA patent within the neck without stenosis. Mild atherosclerotic plaque about the carotid bifurcation and within the proximal ICA. Partially retropharyngeal course of the cervical ICA. Left carotid system: CCA and ICA patent within the neck without stenosis. Minimal atherosclerotic plaque within the proximal ICA.  Vertebral arteries: Vertebral arteries patent within the neck without stenosis. The left vertebral artery is dominant. Mild nonstenotic calcified plaque at the origin of the right vertebral artery. Skeleton: Reversal of the expected cervical lordosis. Cervical spondylosis. No acute bony abnormality or aggressive osseous lesion. Other neck: Heterogeneous and multinodular thyroid gland, previously evaluated by thyroid ultrasound on 03/04/2021. Additionally, there has been previous FNA of a and nodule within the isthmus. No cervical lymphadenopathy. Upper chest: No consolidation within the imaged lung apices. Biapical parenchymal scarring. Review of the MIP images confirms the above findings CTA HEAD FINDINGS Anterior circulation: The intracranial internal carotid arteries are patent. Calcified plaque within both vessels with no more than mild stenosis. The M1 middle cerebral arteries are patent. No M2 proximal branch occlusion or high-grade proximal stenosis is identified. The anterior cerebral arteries are patent. Unchanged 2 mm superiorly projecting vascular protrusion at the right ophthalmic artery origin (best appreciated on series 10, image 109) (series 8, image 104). Unchanged 1-2 mm inferiorly projecting vascular protrusion arising from the supraclinoid left ICA likely reflecting an aneurysm (series 10, image 124). Posterior circulation: The intracranial vertebral arteries are patent. The basilar artery is patent. The posterior cerebral arteries are patent. Both posterior cerebral arteries demonstrate distal branch atherosclerotic irregularity. Posterior communicating arteries are diminutive or absent bilaterally. Venous sinuses: Within the limitations of contrast timing, no convincing thrombus. Anatomic variants: As described. Review of the MIP images confirms the above findings No emergent large vessel occlusion identified. These results were communicated to Dr. Theda Sers at 3:43 pmon 02/14/2023by text page via  the [facility] messaging system. IMPRESSION: CTA neck: 1. The common carotid, internal carotid and vertebral arteries are patent within the neck without stenosis. 2.  Aortic Atherosclerosis (ICD10-I70.0). CTA head: 1. No intracranial large vessel occlusion or proximal high-grade arterial stenosis identified. 2. 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 3. 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 Electronically Signed   By: Kellie Simmering D.O.   On: 12/23/2021 15:44   CT US GUIDE VASC ACCESS LT NO REPORT  Result Date: 11/27/2021 There is no Radiologist interpretation  for this exam.   PHYSICAL EXAM General:  Intubated, well-nourished, well-developed middle-aged African American lady in no acute distress  Neurological: Patient is intubated.  Not sedated.  Comatose.  Eyes are closed.  Pupils oval, 43mm, nonreactive to light.  Corneal and oculocephalic reflexes absent.  Cough reflex present.  Myoclonal jerking of trapezius muscle present on the left.  Triple flexion in BLE to noxious stimuli present.  No spontaneous motor movement seen.  ASSESSMENT/PLAN [name] is a 71 y.o. female with history of CKD, anemia, arthritis, stroke and HTN presenting with confusion and aphasia.  TNK was administered, but unfortunately, patient developed an acute hemorrhage in the lower midbrain and upper pons.  Cryoprecipitate was given, but hemorrhage was noted to be enlarged on follow up CT.  Prognosis is poor, and discussion were had with patient's children regarding goals  of care.    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 ASPECTS 10.    CTA head & neck No LVO or hemodynamically significant stenosis.  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 Repeat CT head acute 73mm hemorrhage in left aspect of  pons/midbrain Second repeat CT head worsening of IPH now involving majority of brainstem and extending into ventricular system leading to acute hydrocephalus 2D Echo EF [id number], grade 1 diastolic dysfunction, interatrial septum not assessed LDL 40 HgbA1c 5.7 VTE prophylaxis - SCDs    Diet   Diet NPO time specified   clopidogrel 75 mg daily prior to admission, now on No antithrombotic secondary to IPH Therapy recommendations:  pending Disposition:  pending, poor prognosis  Cerebral edema with acute hydrocephalus Acute hydrocephalus seen on follow up CT 3% HTS at 75cc/hr  Hypertension Home meds:  coreg 25 mg daily Stable Keep SBP 120-140 Long-term BP goal normotensive  Hyperlipidemia Home meds:  rosuvastatin 10 mg daily, LDL 40, goal < 70 Continue statin at discharge  Respiratory failure Patient was intubated due to failure to protect airway Ventilator management per CCM  Other Stroke Risk Factors Advanced Age >/= 61  Obesity, Body mass index is 31.42 kg/m., BMI >/= 30 associated with increased stroke risk, recommend weight loss, diet and exercise as appropriate  Hx stroke   Other Active Problems none  Hospital day # [place] , MSN, AGACNP-BC [facility] See Amion for schedule and pager information 11/28/2021 12:34 PM   STROKE MD NOTE : I have personally obtained history,examined this patient, reviewed notes, independently viewed imaging studies, participated in medical decision making and plan of care.ROS completed by me personally and pertinent positives fully documented  I have made any additions or clarifications directly to the above note. Agree with note above.  Patient presented with aphasia which was disabling and was given IV TNK but unfortunately developed intracerebral hemorrhage involving the brainstem resulting hydrocephalus and brain stem and brain compression and herniation which was reversed with cryoprecipitate but follow-up  scans shows increasing hemorrhage with a very poor neurological exam which is likely not compatible with life and meaningful recovery.  I had a long discussion with the patient's son and daughter at the bedside and explained the poor prognosis and need to consider terminal extubation and comfort care measures.  Family is in shock and they have agreed to think about DNR and would like to wait for patient's family member to come from [facility] and may withdrawal export the next few days.  Continue present care for now but will likely not escalate care further and hopefully can remain for DNR soon.  Discussed with Dr. Lynetta Mare critical care medicine.This patient is critically ill and at significant risk of neurological worsening, death and care requires constant monitoring of vital signs, hemodynamics,respiratory and cardiac monitoring, extensive review of multiple databases, frequent neurological assessment, discussion with family, other specialists and medical decision making of high complexity.I have made any additions or clarifications directly to the above note.This critical care time does not reflect procedure time, or teaching time or supervisory time of PA/NP/Med Resident etc but could involve care discussion time.  I spent 30 minutes of neurocritical care time  in the care of  this patient.      Antony Contras, MD Medical Director [facility] Stroke Center Pager: [phone] 11/28/2021 3:09 PM  To contact Stroke Continuity provider, please refer to [email or web address] After hours, contact General Neurology

## 2021-11-28 NOTE — Progress Notes (Signed)
PT Cancellation Note  Patient Details Name: Michelle Graves  MRN: [id number] DOB: 05/10/1951   Cancelled Treatment:    Reason Eval/Treat Not Completed: Medical issues which prohibited therapy; noted events, will sign off per RN.   Elray McgregorCynthia Dmitri Pettigrew 11/28/2021, [name], PT Acute Rehabilitation Services [phone] [phone] 11/28/2021

## 2021-11-28 NOTE — TOC CAGE-AID Note (Signed)
Transition of Care [facility](TOC) - CAGE-AID Screening   Patient Details  Name: Michelle Graves  MRN: [id number] Date of Birth: 11/08/1950  Transition of Care [facility](TOC) CM/SW Contact:    Jemel Ono [name], LCSWA Phone Number: 11/28/2021, 1:21 PM   Clinical Narrative: Pt is unable to participate in Cage Aid. Pt is currently experiencing AMS and confused.  CSW will assess at a better time.  Michelle Graves, MSW, LCSW-A Pronouns:  She/Her/Hers  Transitions of Care Clinical Social Worker Direct Number:  [phone] [email or web address]   CAGE-AID Screening: Substance Abuse Screening unable to be completed due to: : Patient unable to participate (Pt is currently experiencing AMS and confused.)             Substance Abuse Education Offered: No

## 2021-11-28 NOTE — Progress Notes (Signed)
°  Transition of Care [facility]) Screening Note   Patient Details  Name: Michelle Graves Date of Birth: [birth year]   Transition [facility] CM/SW Contact:    Mearl Latin, LCSW Phone Number: 11/28/2021, 11:21 AM    Transition of Care Department [facility]) has reviewed patient and no TOC needs have been identified at this time. We will continue to monitor patient advancement through interdisciplinary progression rounds. If new patient transition needs arise, please place a TOC consult.

## 2021-11-28 NOTE — Progress Notes (Signed)
°  Echocardiogram 2D Echocardiogram has been performed.  Michelle Graves 11/28/2021, 9:33 AM

## 2021-11-28 NOTE — Progress Notes (Signed)
NAME:  Michelle Graves, MRN:  [id number], DOB:  08-25-1951, LOS: 1 ADMISSION DATE:  11/28/2021, CONSULTATION DATE:  12/10/2021 REFERRING MD:  Dr. Theda Sers, CHIEF COMPLAINT:  Aphasia    History of Present Illness:  HPI obtained from medical chart review as patient is unable to provide due to altered mental status.   71 year old female with PMH as below who presented to ER with complaints of altered mental status.  EMS was called after patient was not answering door for physical therapy.  She was seen two days ago for hip pain and started on oxycodone and trazadone for sleep.  She was alert and oriented x1 on EMS arrival, LSW 1/31 around 0900, but improved by arrival to triage and was alert and oriented x 4.  While in triage, she then became severely aphasic, and noted to have right sided weakness.  A code stroke was activated and taken for [facility]  which was negative for acute ICH or acute infarction.   She was given TNK full dose at 1452.    She then was noted to have declining mental status.  Taken for repeat [facility] which showed an 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  She was reversed with TXA and started on cryo.  She was transferred to the ICU, where she was unable to protect her airway and emergently intubated on arrival.    Pertinent  Medical History  RA, CKD3b, anemia, HTN, CVA, diverticulitis, HLD, ascending aortic aneurysm   Significant Hospital Events: Including procedures, antibiotic start and stop dates in addition to other pertinent events   2/1 presented with aphasia and right sided weakness.  CTH neg.  S/p TNK complicated by IPH s/p TXA and cryo and requiring intubation for airway protection.    12/16/2021 CTH:  There is no acute intracranial hemorrhage or evidence of acute infarction. ASPECT score is 10. CTA neck:  The common carotid, internal carotid and vertebral arteries are patent within the neck without stenosis.  Aortic Atherosclerosis.   CTA head:   No intracranial large vessel occlusion or proximal high-grade arterial stenosis identified.  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 Repeat CTH post TNK showed an 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  Interim History / Subjective:  Getting ECHO this morning   Objective   Blood pressure (!) 134/91, pulse 94, temperature (!) 96.6 F (35.9 C), temperature source Axillary, resp. rate 20, height 5\' 7"  (1.702 m), weight 91 kg, SpO2 100 %.    Vent Mode: PRVC FiO2 (%):  [40 %-100 %] 40 % Set Rate:  [16 bmp-20 bmp] 20 bmp Vt Set:  [490 mL] 490 mL PEEP:  [5 cmH20] 5 cmH20 Plateau Pressure:  [16 cmH20-19 cmH20] 17 cmH20   Intake/Output Summary (Last 24 hours) at 11/28/2021 1302 Last data filed at 11/28/2021 0600 Gross per 24 hour  Intake 1253.32 ml  Output 600 ml  Net 653.32 ml   Filed Weights   12/18/2021 1403  Weight: 91 kg   Examination: General:  Critically ill adult F, intubated, reclined in bed  HEENT:  [place]. ETT secure Trachea midline  Neuro:  25mm pupil, non-reactive. Does not follow commands.  CV: rrr PULM: Mechanically ventilated. 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 Concern for downward herniation -s/p TXA, cryo P -per neuro -3% saline -hold antiplt -follow ECHO -poor prognosis  Endotracheal tube present due to acute respiratory insufficiency with poor airway protection due to intracranial process  -  Cont MV support, does not meet SBT criteria   HTN, poorly controlled  -goal SBP <140 -cont cleviprex   CKD 3a Iatrogenic Hypernatremia - purewick -trend UOP, renal indices -3% per neuro   RA - hold home plaquenil and leflunomide   Best Practice (right click and "Reselect all SmartList Selections" daily)   Diet/type: NPO DVT prophylaxis: SCD GI prophylaxis: PPI Lines: N/A Foley:  N/A Code Status:  full code Last date of multidisciplinary goals of care discussion [per primary ]  Labs   CBC: Recent Labs  Lab 11/24/21 2001 12/05/2021 1554 12/17/2021 1639 11/29/2021 1834 11/28/21 0607  WBC 8.0 7.1  --   --  7.0  NEUTROABS 4.9 3.7  --   --   --   HGB 10.5* 10.3* 10.5* 10.2* 11.7*  HCT 32.1* 32.3* 31.0* 30.0* 35.2*  MCV 95.0 98.2  --   --  93.4  PLT 181 177  --   --  205    Basic Metabolic Panel: Recent Labs  Lab 11/24/21 2001 12/08/2021 1554 12/24/2021 1639 11/30/2021 1834 11/28/2021 2019 11/28/21 0607  NA 138 141 141 144  --  149*  K 5.0 4.3 4.2 3.8  --  4.0  CL 107 106 108  --   --  115*  CO2 23 25  --   --   --  19*  GLUCOSE 140* 89 83  --   --  101*  BUN 46* 40* 36*  --   --  33*  CREATININE 1.65* 1.53* 1.60*  --   --  1.55*  CALCIUM 8.5* 8.6*  --   --   --  9.5  MG  --   --   --   --  1.7  --    GFR: Estimated Creatinine Clearance: 39.7 mL/min (A) (by C-G formula based on SCr of 1.55 mg/dL (H)). Recent Labs  Lab 11/24/21 2001 12/11/2021 1554 11/28/21 0607  WBC 8.0 7.1 7.0    Liver Function Tests: Recent Labs  Lab 12/24/2021 1554 11/28/21 0607  AST 31 29  ALT 15 16  ALKPHOS 101 95  BILITOT 0.6 0.6  PROT 6.0* 6.6  ALBUMIN 2.9* 3.0*   No results for input(s): LIPASE, AMYLASE in the last 168 hours. No results for input(s): AMMONIA in the last 168 hours.  ABG    Component Value Date/Time   PHART 7.312 (L) 12/23/2021 1834   PCO2ART 48.7 (H) 12/16/2021 1834   PO2ART 440 (H) 11/29/2021 1834   HCO3 24.7 12/02/2021 1834   TCO2 26 12/07/2021 1834    ACIDBASEDEF 2.0 12/21/2021 1834   O2SAT 100.0 12/08/2021 1834     Coagulation Profile: Recent Labs  Lab 11/29/2021 1554  INR 1.0    Cardiac Enzymes: No results for input(s): CKTOTAL, CKMB, CKMBINDEX, TROPONINI in the last 168 hours.  HbA1C: Hgb A1c MFr Bld  Date/Time Value Ref Range Status  03/19/2021 05:02 PM 5.7 (H) 4.8 - 5.6 % Final  Comment:    (NOTE)         Prediabetes: 5.7 - 6.4         Diabetes: >6.4         Glycemic control for adults with diabetes: <7.0   01/26/2021 02:46 AM 6.4 (H) 4.8 - 5.6 % Final    Comment:    (NOTE) Pre diabetes:          5.7%-6.4%  Diabetes:              >6.4%  Glycemic control for   <7.0% adults with diabetes     CBG: Recent Labs  Lab 12/12/2021 2038 11/28/21 0020 11/28/21 0309 11/28/21 0825 11/28/21 1111  GLUCAP 92 105* 105* 106* 150*   CCT: 38min   Eliseo Gum MSN, AGACNP-BC [place] for pager  11/28/2021, 1:14 PM

## 2021-11-28 NOTE — Progress Notes (Signed)
Latest NA level "in process.".Marland Kitchen. phlebotomy verified it has been drawn, Lab states they will run it now. Info discussed w/ RPH [place].

## 2021-11-28 NOTE — Progress Notes (Signed)
SLP Cancellation Note  Patient Details Name: Michelle MallingBarbara Graves  MRN: [id number] DOB: 03/07/1951   Cancelled treatment:       Reason Eval/Treat Not Completed: SLP screened, no needs identified, will sign off (Rehab was advised by RN to sign off given pt's current medical condition.)  Alma Mohiuddin I. Vear ClockPhillips, MS, CCC-SLP Acute Rehabilitation Services Office number [phone] Pager [phone]  Scheryl MartenShanika I Kaylamarie Swickard 11/28/2021, 10:48 AM

## 2021-11-28 NOTE — Progress Notes (Signed)
Pt temp low- 95-96. Bair hugger placed, esophageal probe inserted to monitor temps continuously.

## 2021-11-29 DIAGNOSIS — I639 Cerebral infarction, unspecified: Secondary | ICD-10-CM | POA: Diagnosis not present

## 2021-11-29 LAB — GLUCOSE, CAPILLARY
Glucose-Capillary: 126 mg/dL — ABNORMAL HIGH (ref 70–99)
Glucose-Capillary: 114 mg/dL — ABNORMAL HIGH (ref 70–99)
Glucose-Capillary: 114 mg/dL — ABNORMAL HIGH (ref 70–99)
Glucose-Capillary: 127 mg/dL — ABNORMAL HIGH (ref 70–99)

## 2021-11-29 LAB — HEMOGLOBIN A1C
Hgb A1c MFr Bld: 6.1 % — ABNORMAL HIGH (ref 4.8–5.6)
Mean Plasma Glucose: 128 mg/dL

## 2021-11-29 LAB — SODIUM
Sodium: 156 mmol/L — ABNORMAL HIGH (ref 135–145)
Sodium: 158 mmol/L — ABNORMAL HIGH (ref 135–145)
Sodium: 157 mmol/L — ABNORMAL HIGH (ref 135–145)

## 2021-11-29 MED ORDER — LABETALOL HCL 5 MG/ML IV SOLN
20.0000 mg | INTRAVENOUS | Status: DC | PRN
  Administered 2021-11-29 – 2021-11-30 (×8): 20 mg via INTRAVENOUS
  Filled 2021-11-29 (×8): qty 4

## 2021-11-29 MED ORDER — SODIUM CHLORIDE 0.9 % IV SOLN: INTRAVENOUS | Status: DC

## 2021-11-29 NOTE — Progress Notes (Addendum)
STROKE TEAM PROGRESS NOTE   INTERVAL HISTORY Patient is seen in her room with her daughter at the bedside. Family agreeable to make patient DNR. Continue conversation on goals of care. Neurological exam remains poor, no pupillary reflex or corneal reflex on exam. Triple flexion noted in lower extremities. Positive cough and gag. PRN labetalol ordered for HTN  Hypertonic saline drip is on hold but serum sodium is still supratherapeutic at 158  Vitals:   11/29/21 0800 11/29/21 0900 11/29/21 1000 11/29/21 1046  BP: (!) 143/97 (!) 158/106 (!) 158/102   Pulse: (!) 107 (!) 109 (!) 108   Resp: 20 20 20 20   Temp: 99.1 F (37.3 C) 98.2 F (36.8 C) 98.1 F (36.7 C)   TempSrc: Esophageal     SpO2: 97% 97% 97% 97%  Weight:      Height:       CBC:  Recent Labs  Lab 11/24/21 2001 12/23/2021 1554 12/22/2021 1639 12/23/2021 1834 11/28/21 0607  WBC 8.0 7.1  --   --  7.0  NEUTROABS 4.9 3.7  --   --   --   HGB 10.5* 10.3*   < > 10.2* 11.7*  HCT 32.1* 32.3*   < > 30.0* 35.2*  MCV 95.0 98.2  --   --  93.4  PLT 181 177  --   --  205   < > = values in this interval not displayed.   Basic Metabolic Panel:  Recent Labs  Lab 12/20/2021 1554 12/06/2021 1639 12/13/2021 1834 12/12/2021 2019 11/28/21 0607 11/28/21 1332 11/29/21 0141 11/29/21 0740  NA 141 141 144  --  149*   < > 156* 158*  K 4.3 4.2 3.8  --  4.0  --   --   --   CL 106 108  --   --  115*  --   --   --   CO2 25  --   --   --  19*  --   --   --   GLUCOSE 89 83  --   --  101*  --   --   --   BUN 40* 36*  --   --  33*  --   --   --   CREATININE 1.53* 1.60*  --   --  1.55*  --   --   --   CALCIUM 8.6*  --   --   --  9.5  --   --   --   MG  --   --   --  1.7  --   --   --   --    < > = values in this interval not displayed.   Lipid Panel:  Recent Labs  Lab 11/28/21 0435  CHOL 111  TRIG 96   95  HDL 52  CHOLHDL 2.1  VLDL 19  LDLCALC 40   HgbA1c:  Recent Labs  Lab 11/28/21 0435  HGBA1C 6.1*   Urine Drug Screen: No results for  input(s): LABOPIA, COCAINSCRNUR, LABBENZ, AMPHETMU, THCU, LABBARB in the last 168 hours.  Alcohol Level  Recent Labs  Lab 12/04/2021 1403  ETH <10    IMAGING past 24 hours No results found.  PHYSICAL EXAM General:  Intubated, well-nourished, well-developed middle-aged African American lady in no acute distress  Neurological: Patient is intubated.  Not sedated.  Comatose.  Eyes are closed.  Pupils oval, 49mm, nonreactive to light.  Corneal and oculocephalic reflexes absent.  Cough reflex present.  Triple flexion in BLE to noxious stimuli present.  No spontaneous motor movement seen.  ASSESSMENT/PLAN [name] is a 71 y.o. female with history of CKD, anemia, arthritis, stroke and HTN presenting with confusion and aphasia.  TNK was administered, but unfortunately, patient developed an acute hemorrhage in the lower midbrain and upper pons.  Cryoprecipitate was given, but hemorrhage was noted to be enlarged on follow up CT.  Prognosis is poor, and discussion were had with patient's children regarding goals of care.    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 ASPECTS 10.    CTA head & neck No LVO or hemodynamically significant stenosis.  33mm periophthalmic aneurysm and <61mm supraclinoid ICA aneurysm Repeat CT head acute 47mm hemorrhage in left aspect of pons/midbrain Second repeat CT head worsening of IPH now involving majority of brainstem and extending into ventricular system leading to acute hydrocephalus 2D Echo EF [id number], grade 1 diastolic dysfunction, interatrial septum not assessed LDL 40 HgbA1c 5.7 VTE prophylaxis - SCDs clopidogrel 75 mg daily prior to admission, now on No antithrombotic secondary to IPH Therapy recommendations:  pending Disposition:  pending, poor prognosis  Cerebral edema with acute hydrocephalus Acute hydrocephalus seen on follow up CT 3%  HTS at 75cc/hr- d/c'd  Na 158  Hypertension Home meds:  coreg 25 mg daily Stable Keep SBP less than 160 Long-term BP goal normotensive  Hyperlipidemia Home meds:  rosuvastatin 10 mg daily, LDL 40, goal < 70 Continue statin at discharge  Respiratory failure Patient was intubated due to failure to protect airway Ventilator management per CCM  Other Stroke Risk Factors Advanced Age >/= 25  Obesity, Body mass index is 31.42 kg/m., BMI >/= 30 associated with increased stroke risk, recommend weight loss, diet and exercise as appropriate  Hx stroke  Other Active Problems Tachycardia  Hospital day # 2  Patient seen and examined by NP/APP with MD. MD to update note as needed.   Janine Ores, DNP, FNP-BC [facility] Pager: [phone]  I have personally obtained history,examined this patient, reviewed notes, independently viewed imaging studies, participated in medical decision making and plan of care.ROS completed by me personally and pertinent positives fully documented  I have made any additions or clarifications directly to the above note. Agree with note above.  Patient's neurological condition remains poor with fairly having cough and gag reflex and no other brainstem reflexes or cortical signs on exam.  Long discussion with daughter at the bedside about poor prognosis and she agrees to DNR but still awaits arrival of other family members next week from out of town.  I explained to her that patient is likely going to decline and may even become brain death over the next day or 2 and then legally they will have to withdraw ventilatory support.  She expressed understanding .discussed with Dr. Silas Flood critical care medicine This patient is critically ill and at significant risk of neurological worsening, death and care requires constant monitoring of vital signs, hemodynamics,respiratory and cardiac monitoring, extensive review of multiple databases, frequent  neurological assessment, discussion with family, other specialists and medical decision making of high complexity.I have made any additions or clarifications directly to the above note.This critical care time does not reflect procedure time, or teaching time or supervisory time of PA/NP/Med Resident etc but could involve care discussion time.  I spent 30 minutes of neurocritical care time  in the care of  this patient.     Antony Contras, MD Medical Director [place] Pager: [phone] 11/29/2021 3:34 PM   To contact Stroke Continuity provider, please refer to [email or web address] After hours, contact General Neurology

## 2021-11-29 NOTE — Progress Notes (Signed)
NAME:  Michelle Graves, MRN:  [id number], DOB:  January 02, 1951, LOS: 2 ADMISSION DATE:  12/08/2021, CONSULTATION DATE:  12/10/2021 REFERRING MD:  Dr. Theda Sers, CHIEF COMPLAINT:  Aphasia    History of Present Illness:  HPI obtained from medical chart review as patient is unable to provide due to altered mental status.   71 year old female with PMH as below who presented to ER with complaints of altered mental status.  EMS was called after patient was not answering door for physical therapy.  She was seen two days ago for hip pain and started on oxycodone and trazadone for sleep.  She was alert and oriented x1 on EMS arrival, LSW 1/31 around 0900, but improved by arrival to triage and was alert and oriented x 4.  While in triage, she then became severely aphasic, and noted to have right sided weakness.  A code stroke was activated and taken for [facility]  which was negative for acute ICH or acute infarction.   She was given TNK full dose at 1452.    She then was noted to have declining mental status.  Taken for repeat [facility] which showed an 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  She was reversed with TXA and started on cryo.  She was transferred to the ICU, where she was unable to protect her airway and emergently intubated on arrival.    Pertinent  Medical History  RA, CKD3b, anemia, HTN, CVA, diverticulitis, HLD, ascending aortic aneurysm   Significant Hospital Events: Including procedures, antibiotic start and stop dates in addition to other pertinent events   2/1 presented with aphasia and right sided weakness.  CTH neg.  S/p TNK complicated by IPH s/p TXA and cryo and requiring intubation for airway protection.    12/17/2021 CTH:  There is no acute intracranial hemorrhage or evidence of acute infarction. ASPECT score is 10. CTA neck:  The common carotid, internal carotid and vertebral arteries are patent within the neck without stenosis.  Aortic Atherosclerosis.   CTA head:   No intracranial large vessel occlusion or proximal high-grade arterial stenosis identified.  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 Repeat CTH post TNK showed an 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  Interim History / Subjective:  GOC discussion with neuro this morning  Plan for no escalation of care, DNR  Objective   Blood pressure (!) 158/102, pulse (!) 108, temperature 98.1 F (36.7 C), resp. rate 20, height 5\' 7"  (1.702 m), weight 91 kg, SpO2 97 %.    Vent Mode: PRVC FiO2 (%):  [40 %] 40 % Set Rate:  [20 bmp] 20 bmp Vt Set:  [490 mL] 490 mL PEEP:  [5 cmH20] 5 cmH20 Plateau Pressure:  [16 cmH20-18 cmH20] 16 cmH20   Intake/Output Summary (Last 24 hours) at 11/29/2021 1046 Last data filed at 11/29/2021 0600 Gross per 24 hour  Intake 1081.5 ml  Output 800 ml  Net 281.5 ml   Filed Weights   12/15/2021 1403  Weight: 91 kg   Examination: General:  Critically ill adult F intubated not sedated NAD  HEENT:  [place]. ETT secure Trachea midline  Neuro:  7mm pupils, non-reactive. Still has weak cough. No corneals  CV: tachycardic, regular. S1s2  PULM: MV. Symmetrical chest expansion. Clear  GI: soft ndnt  Extremities: no acute joint  deformity no cyanosis or clubbing Skin: c/d/w no rash   Resolved Hospital Problem list    Assessment & Plan:   L MCA CVA s/p TNK c/b brainstem IPH with associated cerebral edema and acute hydrocephalus  Hx R ophthalmic arety aneurysm, supraclinoid L ICA aneurysm  - post TNK CTH > 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 Concern for downward herniation -s/p TXA, cryo P -per neuro -poor prognosis  Endotracheal tube present due to acute respiratory insufficiency with poor airway protection requiring MV due to stroke  - cont MV support   - pulm hygiene, VAP bundle   HTN, poorly controlled  -goal SBP <140 -- cleviprex for goal   CKD 3a Iatrogenic hypernatremia  - no further hypertonic   RA - hold home plaquenil and leflunomide   Goals of Care -Neurology has discussed [place] with family 2/3 -- pt will be DNR with no escalation of care. Out of town family will likely arrive in a few days and comfort care will be revisited.  Family is aware pt may die in interim   Best Practice (right click and "Reselect all SmartList Selections" daily)   Diet/type: NPO DVT prophylaxis: SCD GI prophylaxis: PPI Lines: N/A Foley:  N/A Code Status:  DNR  Last date of multidisciplinary goals of care discussion [2/3 ]  Labs   CBC: Recent Labs  Lab 11/24/21 2001 12/20/2021 1554 12/09/2021 1639 12/02/2021 1834 11/28/21 0607  WBC 8.0 7.1  --   --  7.0  NEUTROABS 4.9 3.7  --   --   --   HGB 10.5* 10.3* 10.5* 10.2* 11.7*  HCT 32.1* 32.3* 31.0* 30.0* 35.2*  MCV 95.0 98.2  --   --  93.4  PLT 181 177  --   --  [id number]    Basic Metabolic Panel: Recent Labs  Lab 11/24/21 2001 12/19/2021 1554 12/05/2021 1639 12/06/2021 1834 12/18/2021 2019 11/28/21 0607 11/28/21 1332 11/28/21 1530 11/28/21 1844 11/29/21 0141 11/29/21 0740  NA 138 141 141 144  --  149* 156* 158* 158* 156* 158*  K 5.0 4.3 4.2 3.8  --  4.0  --   --   --   --   --   CL 107 106 108  --   --  115*  --   --   --   --   --   CO2 23 25  --   --   --  19*  --   --   --   --   --   GLUCOSE 140* 89 83  --   --  101*  --   --   --   --   --   BUN 46* 40* 36*  --   --  33*  --   --   --   --   --   CREATININE 1.65* 1.53* 1.60*  --   --  1.55*  --   --   --   --   --   CALCIUM 8.5* 8.6*  --   --   --  9.5  --   --   --   --   --   MG  --   --   --   --  1.7  --   --   --   --   --   --    GFR: Estimated Creatinine Clearance: 39.7 mL/min (A) (by C-G formula based on SCr of 1.55 mg/dL (H)).  Recent Labs  Lab 11/24/21 2001 12/09/2021 1554 11/28/21 0607  WBC 8.0 7.1 7.0    Liver  Function Tests: Recent Labs  Lab 12/22/2021 1554 11/28/21 0607  AST 31 29  ALT 15 16  ALKPHOS 101 95  BILITOT 0.6 0.6  PROT 6.0* 6.6  ALBUMIN 2.9* 3.0*   No results for input(s): LIPASE, AMYLASE in the last 168 hours. No results for input(s): AMMONIA in the last 168 hours.  ABG    Component Value Date/Time   PHART 7.312 (L) 12/09/2021 1834   PCO2ART 48.7 (H) 12/22/2021 1834   PO2ART 440 (H) 12/11/2021 1834   HCO3 24.7 12/11/2021 1834   TCO2 26 12/21/2021 1834   ACIDBASEDEF 2.0 12/24/2021 1834   O2SAT 100.0 12/17/2021 1834     Coagulation Profile: Recent Labs  Lab 12/12/2021 1554  INR 1.0    Cardiac Enzymes: No results for input(s): CKTOTAL, CKMB, CKMBINDEX, TROPONINI in the last 168 hours.  HbA1C: Hgb A1c MFr Bld  Date/Time Value Ref Range Status  11/28/2021 04:35 AM 6.1 (H) 4.8 - 5.6 % Final    Comment:    (NOTE)         Prediabetes: 5.7 - 6.4         Diabetes: >6.4         Glycemic control for adults with diabetes: <7.0   03/19/2021 05:02 PM 5.7 (H) 4.8 - 5.6 % Final    Comment:    (NOTE)         Prediabetes: 5.7 - 6.4         Diabetes: >6.4         Glycemic control for adults with diabetes: <7.0     CBG: Recent Labs  Lab 11/28/21 1541 11/28/21 1928 11/28/21 2312 11/29/21 0312 11/29/21 0738  GLUCAP 146* 130* 127* 126* 114*      Total critical care time: 35 minutes  Critical care time was exclusive of separately billable procedures and treating other patients.  Critical care was necessary to treat or prevent imminent or life-threatening deterioration.  Critical care was time spent personally by me on the following activities: development of treatment plan with patient and/or surrogate as well as nursing, discussions with consultants, evaluation of patient's response to treatment, examination of patient, obtaining history from patient or surrogate, ordering and performing treatments and interventions, ordering and review of laboratory studies,  ordering and review of radiographic studies, pulse oximetry and re-evaluation of patient's condition.  Eliseo Gum MSN, AGACNP-BC [place] for pager  11/29/2021, 10:46 AM

## 2021-11-29 NOTE — TOC CAGE-AID Note (Signed)
Transition of Care [facility](TOC) - CAGE-AID Screening   Patient Details  Name: Michelle MallingBarbara J Colville MRN: [id number] Date of Birth: 03/24/1951  Transition of Care [facility](TOC) CM/SW Contact:    Maddelynn Moosman C Tarpley-Carter, LCSWA Phone Number: 11/29/2021, 11:29 AM   Clinical Narrative: Pt is unable to participate in Cage Aid. Pt is still experiencing AMS and confusion.  Tereza Gilham Tarpley-Carter, MSW, LCSW-A Pronouns:  She/Her/Hers [place] Transitions of Care Clinical Social Worker Direct Number:  [phone] [email or web address]  CAGE-AID Screening: Substance Abuse Screening unable to be completed due to: : Patient unable to participate (Pt is currently experiencing AMS and confused.)             Substance Abuse Education Offered: No

## 2021-11-29 NOTE — Progress Notes (Signed)
Pt continues to have SBP >160 despite PRN labetalol. Called and discussed with NP Kyung Rudd: Give 100 mcg of fentanyl dose x1 from PRN order. If pt's BP continues to be >160, start cleviprex gtt back.

## 2021-11-30 DIAGNOSIS — I613 Nontraumatic intracerebral hemorrhage in brain stem: Secondary | ICD-10-CM

## 2021-11-30 DIAGNOSIS — G911 Obstructive hydrocephalus: Secondary | ICD-10-CM

## 2021-11-30 DIAGNOSIS — I639 Cerebral infarction, unspecified: Secondary | ICD-10-CM | POA: Diagnosis not present

## 2021-11-30 DIAGNOSIS — Z9282 Status post administration of tPA (rtPA) in a different facility within the last 24 hours prior to admission to current facility: Secondary | ICD-10-CM

## 2021-11-30 DIAGNOSIS — N179 Acute kidney failure, unspecified: Secondary | ICD-10-CM

## 2021-11-30 DIAGNOSIS — Z01818 Encounter for other preprocedural examination: Secondary | ICD-10-CM

## 2021-11-30 LAB — GLUCOSE, CAPILLARY
Glucose-Capillary: 109 mg/dL — ABNORMAL HIGH (ref 70–99)
Glucose-Capillary: 114 mg/dL — ABNORMAL HIGH (ref 70–99)
Glucose-Capillary: 119 mg/dL — ABNORMAL HIGH (ref 70–99)
Glucose-Capillary: 142 mg/dL — ABNORMAL HIGH (ref 70–99)
Glucose-Capillary: 152 mg/dL — ABNORMAL HIGH (ref 70–99)
Glucose-Capillary: 77 mg/dL (ref 70–99)
Glucose-Capillary: 94 mg/dL (ref 70–99)

## 2021-11-30 LAB — BASIC METABOLIC PANEL
Anion gap: 14 (ref 5–15)
BUN: 34 mg/dL — ABNORMAL HIGH (ref 8–23)
CO2: 18 mmol/L — ABNORMAL LOW (ref 22–32)
Calcium: 8.6 mg/dL — ABNORMAL LOW (ref 8.9–10.3)
Chloride: 126 mmol/L — ABNORMAL HIGH (ref 98–111)
Creatinine, Ser: 2.07 mg/dL — ABNORMAL HIGH (ref 0.44–1.00)
GFR, Estimated: 25 mL/min — ABNORMAL LOW (ref >60)
Glucose, Bld: 153 mg/dL — ABNORMAL HIGH (ref 70–99)
Potassium: 4.1 mmol/L (ref 3.5–5.1)
Sodium: 158 mmol/L — ABNORMAL HIGH (ref 135–145)

## 2021-11-30 LAB — CBC
HCT: 35.2 % — ABNORMAL LOW (ref 36.0–46.0)
Hemoglobin: 11 g/dL — ABNORMAL LOW (ref 12.0–15.0)
MCH: 31.1 pg (ref 26.0–34.0)
MCHC: 31.3 g/dL (ref 30.0–36.0)
MCV: 99.4 fL (ref 80.0–100.0)
Platelets: 178 10*3/uL (ref 150–400)
RBC: 3.54 MIL/uL — ABNORMAL LOW (ref 3.87–5.11)
RDW: 15.5 % (ref 11.5–15.5)
WBC: 18.2 10*3/uL — ABNORMAL HIGH (ref 4.0–10.5)
nRBC: 0.3 % — ABNORMAL HIGH (ref 0.0–0.2)

## 2021-11-30 MED ORDER — CARVEDILOL 12.5 MG PO TABS
25.0000 mg | ORAL_TABLET | Freq: Two times a day (BID) | ORAL | Status: DC
  Administered 2021-11-30 – 2021-12-03 (×3): 25 mg
  Filled 2021-11-30 (×5): qty 2

## 2021-11-30 MED ORDER — CARVEDILOL 12.5 MG PO TABS
25.0000 mg | ORAL_TABLET | Freq: Two times a day (BID) | ORAL | Status: DC
  Filled 2021-11-30: qty 2

## 2021-11-30 MED ORDER — AMLODIPINE BESYLATE 10 MG PO TABS
10.0000 mg | ORAL_TABLET | Freq: Every day | ORAL | Status: DC
  Administered 2021-11-30 – 2021-12-03 (×3): 10 mg
  Filled 2021-11-30 (×4): qty 1

## 2021-11-30 NOTE — Progress Notes (Addendum)
STROKE TEAM PROGRESS NOTE   INTERVAL HISTORY Patient is seen in her room with her daughter at the bedside. Family agreeable to make patient DNR. Continue conversation on goals of care. Neurological exam remains poor, no corneal reflex on exam. Triple flexion noted in lower extremities. Flexion to pain in lower extremities, trace flexion to noxious stimuli in left upper extremity.  Positive cough and gag. Oculocephalic reflex negative. Not breathing over the vent.    Vitals:   11/30/21 0600 11/30/21 0700 11/30/21 0739 11/30/21 0800  BP: (!) 151/88 (!) 146/94 (!) 143/99 (!) 148/88  Pulse: 84 95 93 (!) 101  Resp: 20 (!) 21 20 20   Temp: (!) 97 F (36.1 C) 98.2 F (36.8 C)  98.4 F (36.9 C)  TempSrc:      SpO2: 99% 96% 95% 96%  Weight:      Height:       CBC:  Recent Labs  Lab 11/24/21 2001 11/28/2021 1554 12/12/2021 1639 11/28/21 0607 11/30/21 0737  WBC 8.0 7.1  --  7.0 18.2*  NEUTROABS 4.9 3.7  --   --   --   HGB 10.5* 10.3*   < > 11.7* 11.0*  HCT 32.1* 32.3*   < > 35.2* 35.2*  MCV 95.0 98.2  --  93.4 99.4  PLT 181 177  --  205 178   < > = values in this interval not displayed.    Basic Metabolic Panel:  Recent Labs  Lab 12/02/2021 2019 11/28/21 0607 11/28/21 1332 11/29/21 1347 11/30/21 0737  NA  --  149*   < > 157* 158*  K  --  4.0  --   --  4.1  CL  --  115*  --   --  126*  CO2  --  19*  --   --  18*  GLUCOSE  --  101*  --   --  153*  BUN  --  33*  --   --  34*  CREATININE  --  1.55*  --   --  2.07*  CALCIUM  --  9.5  --   --  8.6*  MG 1.7  --   --   --   --    < > = values in this interval not displayed.    Lipid Panel:  Recent Labs  Lab 11/28/21 0435  CHOL 111  TRIG 96   95  HDL 52  CHOLHDL 2.1  VLDL 19  LDLCALC 40    HgbA1c:  Recent Labs  Lab 11/28/21 0435  HGBA1C 6.1*    Urine Drug Screen: No results for input(s): LABOPIA, COCAINSCRNUR, LABBENZ, AMPHETMU, THCU, LABBARB in the last 168 hours.  Alcohol Level  Recent Labs  Lab 12/17/2021 1403   ETH <10     IMAGING past 24 hours No results found.  PHYSICAL EXAM General:  Intubated, well-nourished, well-developed middle-aged African American lady in no acute distress  Neurological: Patient is intubated.  Not sedated.  Comatose.  Eyes are closed.  Pupils oval 43mm, nonreactive to light.  Corneal and oculocephalic reflexes absent.  Cough reflex present.    Triple flexion in BLE to noxious stimuli present, flicker of muscle noted in left upper extremity.  No spontaneous motor movement seen.  ASSESSMENT/PLAN Michelle Graves is a 71 y.o. female with history of CKD, anemia, arthritis, stroke and HTN presenting with confusion and aphasia.  TNK was administered, but unfortunately, patient developed an acute hemorrhage in the lower midbrain and upper pons.  Cryoprecipitate was given, but hemorrhage was noted to be enlarged on follow up CT.  Prognosis is poor, and discussion were had with patient's children regarding goals of care.  Family is coming from [facility] on Monday and further decisions will be made then.   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 ASPECTS 10.    CTA head & neck No LVO or hemodynamically significant stenosis.  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 Repeat CT head acute 64mm hemorrhage in left aspect of pons/midbrain Second repeat CT head worsening of IPH now involving majority of brainstem and extending into ventricular system leading to acute hydrocephalus 2D Echo EF [id number], grade 1 diastolic dysfunction, interatrial septum not assessed LDL 40 HgbA1c 5.7 VTE prophylaxis - SCDs clopidogrel 75 mg daily prior to admission, now on No antithrombotic secondary to IPH Therapy recommendations:  pending Disposition:  pending, poor prognosis  Cerebral edema with acute hydrocephalus Acute hydrocephalus seen on follow up CT 3% HTS at 75cc/hr- d/c'd  Na  158->158  Hypertension Home meds:  coreg 25 mg daily Stable Keep SBP less than 160 Long-term BP goal normotensive  Hyperlipidemia Home meds:  rosuvastatin 10 mg daily, LDL 40, goal < 70 Continue statin at discharge  Respiratory failure Patient was intubated due to failure to protect airway Ventilator management per CCM Not breathing over the vent  Other Stroke Risk Factors Advanced Age >/= 23  Obesity, Body mass index is 31.42 kg/m., BMI >/= 30 associated with increased stroke risk, recommend weight loss, diet and exercise as appropriate  Hx of stroke  Other Active Problems Tachycardia  Hospital day # 3  Patient seen and examined by NP/APP with MD. MD to update note as needed.   Michelle Ores, DNP, FNP-BC [facility] Pager: [phone]  ATTENDING NOTE: I reviewed above note and agree with the assessment and plan. Pt was seen and examined.   71 year old female with history of hypertension, CKD, stroke admitted for confusion, aphasia.  Reported symptoms resolution ED and then recurrent symptoms.  CT no acute abnormality.  Status post TNK, CT head and neck unremarkable, no LVO.  CT repeat showed left pontine ICH, on repeat CT showed large pontine and midbrain ICH with obstructive hydrocephalus.  Status post TXA and cryoprecipitate.  Intubated for airway protection.  EF 70 to 75%, LDL 40 and A1c 5.7.  On exam, patient intubated, unresponsive, in coma.  Not open eyes on voice, with forced eye opening, eyes in midline, no doll's eyes, no corneal, left pupil 4 mm, right pupil 6 mm, nonreactive to light.  Positive gag but not breathing over the vent although RR setting at 20.  Mild withdraw to pain summation on the left upper and lower extremity, right hemiplegic.  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  However, large ICH in brainstem certainly indicate poor prognosis.  Patient exam consistent with poor prognosis.  Currently on  Cleviprex for BP control, add Coreg and amlodipine through OG tube.  Sodium 158, off 3% saline.  Developed fever and AKI, on IV fluid.  Continue supportive care.  For detailed assessment and plan, please refer to above as I have made changes wherever appropriate.   I had long discussion with daughter at bedside, updated pt current condition, treatment plan and potential prognosis, and answered all the questions.  She expressed understanding and appreciation.  She stated that her family  is coming on Monday and Tuesday, after that family plan for comfort care measures.  Rosalin Hawking, MD PhD Stroke Neurology 11/30/2021 5:44 PM  This patient is critically ill due to stroke status post TNK, brainstem ICH, IVH with hydrocephalus, respiratory failure, AKI, fever and at significant risk of neurological worsening, death form brain herniation, cerebral edema, obstructive hydrocephalus, renal failure, sepsis. This patient's care requires constant monitoring of vital signs, hemodynamics, respiratory and cardiac monitoring, review of multiple databases, neurological assessment, discussion with family, other specialists and medical decision making of high complexity. I spent 40 minutes of neurocritical care time in the care of this patient.   To contact Stroke Continuity provider, please refer to [email or web address] After hours, contact General Neurology

## 2021-11-30 NOTE — Progress Notes (Addendum)
NAME:  Michelle Graves, MRN:  [id number], DOB:  1951/07/19, LOS: 3 ADMISSION DATE:  12/22/2021, CONSULTATION DATE:  12/18/2021 REFERRING MD:  Dr. Theda Sers, CHIEF COMPLAINT:  Aphasia    History of Present Illness:  HPI obtained from medical chart review as patient is unable to provide due to altered mental status.   71 year old female with PMH as below who presented to ER with complaints of altered mental status.  EMS was called after patient was not answering door for physical therapy.  She was seen two days ago for hip pain and started on oxycodone and trazadone for sleep.  She was alert and oriented x1 on EMS arrival, LSW 1/31 around 0900, but improved by arrival to triage and was alert and oriented x 4.  While in triage, she then became severely aphasic, and noted to have right sided weakness.  A code stroke was activated and taken for [facility]  which was negative for acute ICH or acute infarction.   She was given TNK full dose at 1452.    She then was noted to have declining mental status.  Taken for repeat [facility] which showed an 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  She was reversed with TXA and started on cryo.  She was transferred to the ICU, where she was unable to protect her airway and emergently intubated on arrival.    Pertinent  Medical History  RA, CKD3b, anemia, HTN, CVA, diverticulitis, HLD, ascending aortic aneurysm   Significant Hospital Events: Including procedures, antibiotic start and stop dates in addition to other pertinent events   2/1 presented with aphasia and right sided weakness.  CTH neg.  S/p TNK complicated by IPH s/p TXA and cryo and requiring intubation for airway protection.    12/08/2021 CTH:  There is no acute intracranial hemorrhage or evidence of acute infarction. ASPECT score is 10. CTA neck:  The common carotid, internal carotid and vertebral arteries are patent within the neck without stenosis.  Aortic Atherosclerosis.   CTA head:   No intracranial large vessel occlusion or proximal high-grade arterial stenosis identified.  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 Repeat CTH post TNK showed an 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  Interim History / Subjective:   NAEO  WBC up to 18 today  Objective   Blood pressure (!) 148/88, pulse (!) 101, temperature 98.4 F (36.9 C), resp. rate 20, height 5\' 7"  (1.702 m), weight 91 kg, SpO2 96 %.    Vent Mode: PRVC FiO2 (%):  [40 %] 40 % Set Rate:  [20 bmp] 20 bmp Vt Set:  [490 mL] 490 mL PEEP:  [5 cmH20] 5 cmH20 Plateau Pressure:  [16 cmH20-18 cmH20] 16 cmH20   Intake/Output Summary (Last 24 hours) at 11/30/2021 [id number] Last data filed at 11/30/2021 0600 Gross per 24 hour  Intake 289.2 ml  Output 2275 ml  Net -1985.8 ml   Filed Weights   12/20/2021 1403  Weight: 91 kg   Examination: General:  Critically ill older adult F intubated not sedated  HEENT:  NCAT pink mm ETT secure Neuro: 34mm pupils. No corneal reflex. + cough. Triple flexion to pain BLE CV: rr s1s2 no rgm  PULM: Symmetrical chest expansion, CTAb, mechanically ventilated  GI: soft ndnt + bowel sounds  Extremities: no acute deformity no  cyanosis or clubbing  Skin: c/d/w no anterior rash   Resolved Hospital Problem list    Assessment & Plan:    L MCA CVA s/p TNK, complicated by brainstem IPH, 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 Concern for downward herniation -s/p TXA, cryo P -neuro primary -poor prognosis has been d/w family -- DNR, no escalation of care. Anticipate likely transition to comfort care early next week   Endotracheal tube present due to acute respiratory  insufficiency with poor airway protection due to stroke, requiring MV  -cont MV support -VAP, pulm hygiene   HTN, poorly controlled -cont cleviprex for goal SBP < 140   CKD 3a Hypernatremia, iatrogenic  - no further hypertonic  -trending BMP  Leukocytosis -reactive  vs infectious -will not work up for infectious etiology or initiate abx -- no escalation of care   RA - hold home plaquenil and leflunomide   GOC, DNR status -Neurology has discussed [place] with family 2/3 -- pt will be DNR with no escalation of care. Out of town family will likely arrive in a few days and comfort care will be revisited.  Family is aware pt may die in interim  -in light of no escalation of care decision, not sure that trending CBC is needed -- will dc this  Best Practice (right click and "Reselect all SmartList Selections" daily)   Diet/type: NPO DVT prophylaxis: SCD GI prophylaxis: PPI Lines: N/A Foley:  N/A Code Status:  DNR  Last date of multidisciplinary goals of care discussion [2/3  -- [place] with family no escalation, DNR]  Labs   CBC: Recent Labs  Lab 11/24/21 2001 12/04/2021 1554 12/02/2021 1639 12/01/2021 1834 11/28/21 0607 11/30/21 0737  WBC 8.0 7.1  --   --  7.0 18.2*  NEUTROABS 4.9 3.7  --   --   --   --   HGB 10.5* 10.3* 10.5* 10.2* 11.7* 11.0*  HCT 32.1* 32.3* 31.0* 30.0* 35.2* 35.2*  MCV 95.0 98.2  --   --  93.4 99.4  PLT 181 177  --   --  205 0000000    Basic Metabolic Panel: Recent Labs  Lab 11/24/21 2001 12/10/2021 1554 12/07/2021 1639 12/08/2021 1834 12/01/2021 2019 11/28/21 0607 11/28/21 1332 11/28/21 1844 11/29/21 0141 11/29/21 0740 11/29/21 1347 11/30/21 0737  NA 138 141 141 144  --  149*   < > 158* 156* 158* 157* 158*  K 5.0 4.3 4.2 3.8  --  4.0  --   --   --   --   --  4.1  CL 107 106 108  --   --  115*  --   --   --   --   --  126*  CO2 23 25  --   --   --  19*  --   --   --   --   --  18*  GLUCOSE 140* 89 83  --   --  101*  --   --   --   --   --  153*  BUN 46*  40* 36*  --   --  33*  --   --   --   --   --  34*  CREATININE 1.65* 1.53* 1.60*  --   --  1.55*  --   --   --   --   --  2.07*  CALCIUM 8.5* 8.6*  --   --   --  9.5  --   --   --   --   --  8.6*  MG  --   --   --   --  1.7  --   --   --   --   --   --   --    < > = values in this interval not displayed.   GFR: Estimated Creatinine Clearance: 29.7 mL/min (A) (by C-G formula based on SCr of 2.07 mg/dL (H)). Recent Labs  Lab 11/24/21 2001 12/23/2021 1554 11/28/21 0607 11/30/21 0737  WBC 8.0 7.1 7.0 18.2*    Liver Function Tests: Recent Labs  Lab 12/04/2021 1554 11/28/21 0607  AST 31 29  ALT 15 16  ALKPHOS 101 95  BILITOT 0.6 0.6  PROT 6.0* 6.6  ALBUMIN 2.9* 3.0*   No results for input(s): LIPASE, AMYLASE in the last 168 hours. No results for input(s): AMMONIA in the last 168 hours.  ABG    Component Value Date/Time   PHART 7.312 (L) 12/01/2021 1834   PCO2ART 48.7 (H) 12/10/2021 1834   PO2ART 440 (H) 12/08/2021 1834   HCO3 24.7 12/06/2021 1834   TCO2 26 12/16/2021 1834   ACIDBASEDEF 2.0 12/12/2021 1834   O2SAT 100.0 12/08/2021 1834     Coagulation Profile: Recent Labs  Lab 11/29/2021 1554  INR 1.0    Cardiac Enzymes: No results for input(s): CKTOTAL, CKMB, CKMBINDEX, TROPONINI in the last 168 hours.  HbA1C: Hgb A1c MFr Bld  Date/Time Value Ref Range Status  11/28/2021 04:35 AM 6.1 (H) 4.8 - 5.6 % Final    Comment:    (NOTE)         Prediabetes: 5.7 - 6.4         Diabetes: >6.4         Glycemic control for adults with diabetes: <7.0   03/19/2021 05:02 PM 5.7 (H) 4.8 - 5.6 % Final    Comment:    (NOTE)         Prediabetes: 5.7 - 6.4         Diabetes: >6.4         Glycemic control for adults with diabetes: <7.0     CBG: Recent Labs  Lab 11/29/21 1124 11/29/21 1538 11/30/21 0011 11/30/21 0332 11/30/21 0738  GLUCAP 114* 127* 119* 142* 152*     CRITICAL CARE Performed by: Cristal Generous   Total critical care time: 35 minutes  Critical care  time was exclusive of separately billable procedures and treating other patients. Critical care was necessary to treat or prevent imminent or life-threatening deterioration.  Critical care was time spent personally by me on the following activities: development of treatment plan with patient and/or surrogate as well as nursing, discussions with consultants, evaluation of patient's response to treatment, examination of patient, obtaining history from patient or surrogate, ordering and performing treatments and interventions, ordering and review of laboratory studies, ordering and review of radiographic studies, pulse oximetry and re-evaluation of patient's condition.  Eliseo Gum MSN, AGACNP-BC [place] for pager  11/30/2021, 9:07 AM  Critical care attending attestation note:   Patient seen and examined and relevant ancillary tests reviewed.  I agree with the assessment and plan of care as outlined by Eliseo Gum, NP.    70 y.o. presented with right-sided weakness and aphasia with negative head CT treated with tenecteplase found to have declining mental status  showing pontine/midbrain hemorrhage with worsening edema and worsening exam.   Pupils are blown and nonreactive.  No gag on exam.  No corneals.  Does have cough.  Event unsurvivable.  DNR 2/3.  Anticipate transition to comfort care in the coming days once family arrives.  No escalation of care per neurology conversation which I agree with.   Synopsis of assessment and plan:   Left MCA CVA status post [place] place with subsequent brainstem intraparenchymal hemorrhage and associated cerebral edema and partial brainstem herniation on exam.  Cough is present.  Not much else. --DNR --Continue supportive care, no escalation of care   Acute hypoxemic respite failure in the setting of brain bleed and inability to protect airway --PRVC   CRITICAL CARE N/a   Lanier Clam, MD See Amion for contact  info

## 2021-12-01 ENCOUNTER — Inpatient Hospital Stay (HOSPITAL_COMMUNITY): Payer: Medicare PPO

## 2021-12-01 DIAGNOSIS — I10 Essential (primary) hypertension: Secondary | ICD-10-CM

## 2021-12-01 DIAGNOSIS — R509 Fever, unspecified: Secondary | ICD-10-CM

## 2021-12-01 DIAGNOSIS — G936 Cerebral edema: Secondary | ICD-10-CM

## 2021-12-01 DIAGNOSIS — I9589 Other hypotension: Secondary | ICD-10-CM

## 2021-12-01 DIAGNOSIS — I639 Cerebral infarction, unspecified: Secondary | ICD-10-CM | POA: Diagnosis not present

## 2021-12-01 DIAGNOSIS — D72829 Elevated white blood cell count, unspecified: Secondary | ICD-10-CM

## 2021-12-01 DIAGNOSIS — I63539 Cerebral infarction due to unspecified occlusion or stenosis of unspecified posterior cerebral artery: Secondary | ICD-10-CM

## 2021-12-01 LAB — BASIC METABOLIC PANEL
Anion gap: 15 (ref 5–15)
BUN: 37 mg/dL — ABNORMAL HIGH (ref 8–23)
CO2: 20 mmol/L — ABNORMAL LOW (ref 22–32)
Calcium: 8.1 mg/dL — ABNORMAL LOW (ref 8.9–10.3)
Chloride: 122 mmol/L — ABNORMAL HIGH (ref 98–111)
Creatinine, Ser: 2.33 mg/dL — ABNORMAL HIGH (ref 0.44–1.00)
GFR, Estimated: 22 mL/min — ABNORMAL LOW (ref >60)
Glucose, Bld: 96 mg/dL (ref 70–99)
Potassium: 3.7 mmol/L (ref 3.5–5.1)
Sodium: 157 mmol/L — ABNORMAL HIGH (ref 135–145)

## 2021-12-01 LAB — URINALYSIS, ROUTINE W REFLEX MICROSCOPIC
Bilirubin Urine: NEGATIVE
Glucose, UA: NEGATIVE mg/dL
Ketones, ur: 15 mg/dL — AB
Nitrite: POSITIVE — AB
Protein, ur: >300 mg/dL — AB
Specific Gravity, Urine: <1.005 — ABNORMAL LOW (ref 1.005–1.030)
pH: >9 — ABNORMAL HIGH (ref 5.0–8.0)

## 2021-12-01 LAB — URINALYSIS, MICROSCOPIC (REFLEX): WBC, UA: >50 WBC/hpf (ref 0–5)

## 2021-12-01 LAB — GLUCOSE, CAPILLARY
Glucose-Capillary: 102 mg/dL — ABNORMAL HIGH (ref 70–99)
Glucose-Capillary: 84 mg/dL (ref 70–99)
Glucose-Capillary: 75 mg/dL (ref 70–99)
Glucose-Capillary: 83 mg/dL (ref 70–99)
Glucose-Capillary: 65 mg/dL — ABNORMAL LOW (ref 70–99)
Glucose-Capillary: 113 mg/dL — ABNORMAL HIGH (ref 70–99)

## 2021-12-01 LAB — TRIGLYCERIDES: Triglycerides: 104 mg/dL (ref <150)

## 2021-12-01 MED ORDER — DEXTROSE 5 % IV BOLUS: 1000.0000 mL | Freq: Once | INTRAVENOUS | Status: DC

## 2021-12-01 MED ORDER — SODIUM CHLORIDE 0.9 % IV SOLN
1.0000 g | INTRAVENOUS | Status: DC
  Administered 2021-12-01 – 2021-12-02 (×2): 1 g via INTRAVENOUS
  Filled 2021-12-01 (×2): qty 10

## 2021-12-01 MED ORDER — DEXTROSE 50 % IV SOLN
12.5000 g | INTRAVENOUS | Status: AC
  Administered 2021-12-01: 12.5 g via INTRAVENOUS
  Filled 2021-12-01: qty 50

## 2021-12-01 MED ORDER — SODIUM CHLORIDE 0.45 % IV SOLN: INTRAVENOUS | Status: DC

## 2021-12-01 MED ORDER — FREE WATER
200.0000 mL | Status: DC
  Administered 2021-12-01: 200 mL

## 2021-12-01 MED ORDER — SODIUM CHLORIDE 0.45 % IV BOLUS
1000.0000 mL | Freq: Once | INTRAVENOUS | Status: AC
  Administered 2021-12-01: 1000 mL via INTRAVENOUS

## 2021-12-01 NOTE — Progress Notes (Signed)
NAME:  Michelle Graves, MRN:  [id number], DOB:  15-Apr-1951, LOS: 4 ADMISSION DATE:  12/12/2021, CONSULTATION DATE:  12/15/2021 REFERRING MD:  Dr. Theda Sers, CHIEF COMPLAINT:  Aphasia    History of Present Illness:  HPI obtained from medical chart review as patient is unable to provide due to altered mental status.   71 year old female with PMH as below who presented to ER with complaints of altered mental status.  EMS was called after patient was not answering door for physical therapy.  She was seen two days ago for hip pain and started on oxycodone and trazadone for sleep.  She was alert and oriented x1 on EMS arrival, LSW 1/31 around 0900, but improved by arrival to triage and was alert and oriented x 4.  While in triage, she then became severely aphasic, and noted to have right sided weakness.  A code stroke was activated and taken for [facility]  which was negative for acute ICH or acute infarction.   She was given TNK full dose at 1452.    She then was noted to have declining mental status.  Taken for repeat [facility] which showed an 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  She was reversed with TXA and started on cryo.  She was transferred to the ICU, where she was unable to protect her airway and emergently intubated on arrival.    Pertinent  Medical History  RA, CKD3b, anemia, HTN, CVA, diverticulitis, HLD, ascending aortic aneurysm   Significant Hospital Events: Including procedures, antibiotic start and stop dates in addition to other pertinent events   2/1 presented with aphasia and right sided weakness.  CTH neg.  S/p TNK complicated by IPH s/p TXA and cryo and requiring intubation for airway protection.    12/17/2021 CTH:  There is no acute intracranial hemorrhage or evidence of acute infarction. ASPECT score is 10. CTA neck:  The common carotid, internal carotid and vertebral arteries are patent within the neck without stenosis.  Aortic Atherosclerosis.   CTA head:   No intracranial large vessel occlusion or proximal high-grade arterial stenosis identified.  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 Repeat CTH post TNK showed an 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  Interim History / Subjective:   NAEON Objective   Blood pressure 99/80, pulse 97, temperature 100 F (37.8 C), resp. rate (!) 21, height 5\' 7"  (1.702 m), weight 91 kg, SpO2 97 %.    Vent Mode: PRVC FiO2 (%):  [40 %] 40 % Set Rate:  [20 bmp] 20 bmp Vt Set:  [490 mL] 490 mL PEEP:  [5 cmH20] 5 cmH20 Plateau Pressure:  [15 cmH20-19 cmH20] 16 cmH20   Intake/Output Summary (Last 24 hours) at 12/01/2021 1117 Last data filed at 12/01/2021 0800 Gross per 24 hour  Intake 1094.11 ml  Output 2375 ml  Net -1280.89 ml    Filed Weights   12/11/2021 1403  Weight: 91 kg   Examination: General:  Critically ill older adult F intubated not sedated  HEENT:  NCAT pink mm ETT secure Neuro: 38mm pupils non reactive, No corneal reflex. + cough.  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 Concern for downward herniation -s/p TXA, cryo P -neuro primary -poor prognosis has been d/w family -- DNR, no escalation of care. Anticipate likely transition to comfort care early week   Endotracheal tube present due to acute respiratory insufficiency with poor airway protection  due to stroke, requiring MV  -cont MV support, compassionate extubation when family arrives -VAP, pulm hygiene    GOC, DNR status -Neurology has discussed [place] with family 2/3 -- pt will be DNR with no escalation of care. Out of town family will likely arrive in a few days and comfort care will be revisited.  Family is aware pt may die in interim  -in light of no escalation of care decision, not sure that trending CBC is needed -- will dc this  Best Practice (right click and "Reselect all SmartList Selections" daily)   Diet/type: NPO DVT prophylaxis: SCD GI prophylaxis: PPI Lines: N/A Foley:  N/A Code Status:  DNR  Last date of multidisciplinary goals of care discussion [2/3  -- [place] with family no escalation, DNR]  Labs   CBC: Recent Labs  Lab 11/24/21 2001 12/02/2021 1554 12/21/2021 1639 12/06/2021 1834 11/28/21 0607 11/30/21 0737  WBC 8.0 7.1  --   --  7.0 18.2*  NEUTROABS 4.9 3.7  --   --   --   --   HGB 10.5* 10.3* 10.5* 10.2* 11.7* 11.0*  HCT 32.1* 32.3* 31.0* 30.0* 35.2* 35.2*  MCV 95.0 98.2  --   --  93.4 99.4  PLT 181 177  --   --  205 178     Basic Metabolic Panel: Recent Labs  Lab 11/24/21 2001 12/18/2021 1554 12/24/2021 1639 12/02/2021 1834 12/23/2021 2019 11/28/21 0607 11/28/21 1332 11/29/21 0141 11/29/21 0740 11/29/21 1347 11/30/21 0737 12/01/21 0441  NA 138 141 141 144  --  149*   < > 156* 158* 157* 158* 157*  K 5.0 4.3 4.2 3.8  --  4.0  --   --   --   --  4.1 3.7  CL 107 106 108  --   --  115*  --   --   --   --  126* 122*  CO2 23 25  --   --   --  19*  --   --   --   --  18* 20*  GLUCOSE 140* 89 83  --   --  101*  --   --   --   --  153* 96  BUN 46* 40* 36*  --   --  33*  --   --   --   --  34* 37*  CREATININE 1.65* 1.53* 1.60*  --   --  1.55*  --   --   --   --  2.07* 2.33*  CALCIUM 8.5* 8.6*  --   --   --  9.5  --   --   --   --  8.6* 8.1*  MG  --   --   --   --  1.7  --   --   --   --   --   --   --    < > =  values in this interval not displayed.     GFR: Estimated Creatinine Clearance: 26.4 mL/min (A) (by C-G formula based on SCr of 2.33 mg/dL (H)). Recent Labs  Lab 11/24/21 2001 12/23/2021 1554 11/28/21 0607 11/30/21 0737  WBC 8.0 7.1 7.0 18.2*     Liver Function Tests: Recent Labs  Lab 12/17/2021 1554 11/28/21 0607  AST 31 29  ALT 15 16  ALKPHOS 101 95  BILITOT 0.6 0.6  PROT 6.0* 6.6  ALBUMIN 2.9* 3.0*    No results for input(s): LIPASE, AMYLASE in the last 168 hours. No results for input(s): AMMONIA in the last 168 hours.  ABG    Component Value Date/Time   PHART 7.312 (L) 12/06/2021 1834   PCO2ART 48.7 (H) 12/13/2021 1834   PO2ART 440 (H) 12/12/2021 1834   HCO3 24.7 12/23/2021 1834   TCO2 26 12/24/2021 1834   ACIDBASEDEF 2.0 12/01/2021 1834   O2SAT 100.0 12/21/2021 1834      Coagulation Profile: Recent Labs  Lab 12/07/2021 1554  INR 1.0     Cardiac Enzymes: No results for input(s): CKTOTAL, CKMB, CKMBINDEX, TROPONINI in the last 168 hours.  HbA1C: Hgb A1c MFr Bld  Date/Time Value Ref Range Status  11/28/2021 04:35 AM 6.1 (H) 4.8 - 5.6 % Final    Comment:    (NOTE)         Prediabetes: 5.7 - 6.4         Diabetes: >6.4         Glycemic control for adults with diabetes: <7.0   03/19/2021 05:02 PM 5.7 (H) 4.8 - 5.6 % Final    Comment:    (NOTE)         Prediabetes: 5.7 - 6.4         Diabetes: >6.4         Glycemic control for adults with diabetes: <7.0     CBG: Recent Labs  Lab 11/30/21 1555 11/30/21 2013 11/30/21 2356 12/01/21 0411 12/01/21 0724  GLUCAP 114* 77 94 102* 66      CRITICAL CARE N/a   Lanier Clam, MD See Amion for contact info

## 2021-12-01 NOTE — Progress Notes (Addendum)
STROKE TEAM PROGRESS NOTE   INTERVAL HISTORY Patient is seen in her room with her daughter and son in law at the bedside.  She has been hemodynamically stable but febrile overnight.  Her neurological exam is unchanged.  OG tube is to suction with a large amount of bilious/coffee ground output noted.  Plan is to transition to comfort care when family members have arrived from out of town.   Vitals:   12/01/21 0800 12/01/21 0900 12/01/21 1000 12/01/21 1017  BP: 106/77 106/78 97/75 99/80   Pulse: (!) 101  97   Resp: 20 20 (!) 21   Temp: (!) 100.9 F (38.3 C) 99.9 F (37.7 C) 100 F (37.8 C)   TempSrc: Bladder     SpO2: 97%  97%   Weight:      Height:       CBC:  Recent Labs  Lab 11/24/21 2001 12/02/2021 1554 12/13/2021 1639 11/28/21 0607 11/30/21 0737  WBC 8.0 7.1  --  7.0 18.2*  NEUTROABS 4.9 3.7  --   --   --   HGB 10.5* 10.3*   < > 11.7* 11.0*  HCT 32.1* 32.3*   < > 35.2* 35.2*  MCV 95.0 98.2  --  93.4 99.4  PLT 181 177  --  205 178   < > = values in this interval not displayed.    Basic Metabolic Panel:  Recent Labs  Lab 12/20/2021 2019 11/28/21 0607 11/30/21 0737 12/01/21 0441  NA  --    < > 158* 157*  K  --    < > 4.1 3.7  CL  --    < > 126* 122*  CO2  --    < > 18* 20*  GLUCOSE  --    < > 153* 96  BUN  --    < > 34* 37*  CREATININE  --    < > 2.07* 2.33*  CALCIUM  --    < > 8.6* 8.1*  MG 1.7  --   --   --    < > = values in this interval not displayed.    Lipid Panel:  Recent Labs  Lab 11/28/21 0435 12/01/21 0441  CHOL 111  --   TRIG 96   95 104  HDL 52  --   CHOLHDL 2.1  --   VLDL 19  --   LDLCALC 40  --     HgbA1c:  Recent Labs  Lab 11/28/21 0435  HGBA1C 6.1*    Urine Drug Screen: No results for input(s): LABOPIA, COCAINSCRNUR, LABBENZ, AMPHETMU, THCU, LABBARB in the last 168 hours.  Alcohol Level  Recent Labs  Lab 12/23/2021 1403  ETH <10     IMAGING past 24 hours DG CHEST PORT 1 VIEW  Result Date: 12/01/2021 CLINICAL DATA:  Fever  EXAM: PORTABLE CHEST 1 VIEW COMPARISON:  12/09/2021 FINDINGS: Cardiac size is within normal limits. There is ectasia of thoracic aorta. Tip of endotracheal tube is approximately 5 cm above the carina. Enteric tube is noted traversing the esophagus. Small linear densities seen in the lower lung fields. There are no signs of pulmonary edema. There is no significant pleural effusion or pneumothorax. IMPRESSION: Small linear densities in the lower lung fields suggest subsegmental atelectasis. Electronically Signed   By: Elmer Picker M.D.   On: 12/01/2021 11:21    PHYSICAL EXAM General:  Intubated, well-nourished, well-developed middle-aged African American lady in no acute distress  Neurological: Patient is intubated.  Not sedated.  Comatose.  Eyes are closed.  Pupils right 76mm, left 61mm, nonreactive to light.  Corneal and oculocephalic reflexes absent.  Cough and gag reflex present.    Triple flexion in BLE to noxious stimuli present, flicker of muscle noted in left upper extremity and mild withdraw at BLEs, L>R.  No spontaneous motor movement seen.  ASSESSMENT/PLAN Michelle Graves is a 71 y.o. female with history of CKD, anemia, arthritis, stroke and HTN presenting with confusion and aphasia.  TNK was administered, but unfortunately, patient developed an acute hemorrhage in the lower midbrain and upper pons.  Cryoprecipitate was given, but hemorrhage was noted to be enlarged on follow up CT.  Prognosis is poor, and discussion were had with patient's children regarding goals of care.  Family is coming from [facility] on Monday and further decisions will be made then.   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 ASPECTS 10.    CTA head & neck No LVO or hemodynamically significant stenosis.  17mm periophthalmic aneurysm and <37mm supraclinoid ICA aneurysm Repeat CT head acute 71mm hemorrhage in left aspect of pons/midbrain Second  repeat CT head worsening of IPH now involving majority of brainstem and extending into ventricular system leading to acute hydrocephalus 2D Echo EF [id number], grade 1 diastolic dysfunction, interatrial septum not assessed LDL 40 HgbA1c 5.7 VTE prophylaxis - SCDs clopidogrel 75 mg daily prior to admission, now on No antithrombotic secondary to [place] Therapy recommendations:  pending Disposition:  pending, poor prognosis, family plan for comfort care on Monday after other family arrives  Cerebral edema with acute hydrocephalus Acute hydrocephalus seen on follow up CT 3% HTS at 75cc/hr -> NS @ 50->1/2NS @ 100 Na 158->158-> 157  Fever, ?  Central fever Leukocytosis  Tmax 103.6-> 101.3 WBC 7.0->18.2 CXR subsegmental atelectasis UA pending  AKI Cre 1.55->2.07->2.33 Increase IV fluid BMP monitoring  Hypertension->hypotension Home meds:  coreg 25 mg daily On the low end Increase IV fluid Keep SBP less than 160 Long-term BP goal normotensive  Hyperlipidemia Home meds:  rosuvastatin 10 mg daily LDL 40, goal < 70 Continue statin at discharge  Respiratory failure Patient was intubated due to failure to protect airway Ventilator management per CCM Not breathing over the vent  Other Stroke Risk Factors Advanced Age >/= 30  Obesity, Body mass index is 31.42 kg/m., BMI >/= 30 associated with increased stroke risk, recommend weight loss, diet and exercise as appropriate  Hx of stroke  Other Active Problems Tachycardia  Hospital day # 4  Patient seen and examined by NP/APP with MD. MD to update note as needed.   Martinez , MSN, AGACNP-BC [facility] See Amion for schedule and pager information 12/01/2021 12:12 PM   ATTENDING NOTE: I reviewed above note and agree with the assessment and plan. Pt was seen and examined.   Daughter and son-in-law are at bedside.  Patient still in coma, on ventilation.  High-grade fever overnight with worsening leukocytosis,  hypotension and AKI.  Concerning for central fever, CXR negative, UA pending.  Increase IV fluid.  Discussed with family again about proper gnosis, they understood and planning for comfort care measures tomorrow after family arrives.  For detailed assessment and plan, please refer to above as I have made changes wherever appropriate.   Rosalin Hawking, MD PhD Stroke Neurology 12/01/2021 2:37 PM  This patient is critically ill due to stroke status post TNK, brainstem  ICH, IVH with hydrocephalus, respiratory failure, AKI, fever and leukocytosis and at significant risk of neurological worsening, death form brain herniation, obstructive hydrocephalus, renal failure, sepsis, heart failure. This patient's care requires constant monitoring of vital signs, hemodynamics, respiratory and cardiac monitoring, review of multiple databases, neurological assessment, discussion with family, other specialists and medical decision making of high complexity. I spent 35 minutes of neurocritical care time in the care of this patient. I had long discussion with daughter and son-in-law at bedside, updated pt current condition, treatment plan and potential prognosis, and answered all the questions.  They expressed understanding and appreciation.      To contact Stroke Continuity provider, please refer to [email or web address] After hours, contact General Neurology

## 2021-12-02 DIAGNOSIS — J9690 Respiratory failure, unspecified, unspecified whether with hypoxia or hypercapnia: Secondary | ICD-10-CM

## 2021-12-02 DIAGNOSIS — I639 Cerebral infarction, unspecified: Secondary | ICD-10-CM | POA: Diagnosis not present

## 2021-12-02 LAB — GLUCOSE, CAPILLARY
Glucose-Capillary: 78 mg/dL (ref 70–99)
Glucose-Capillary: 80 mg/dL (ref 70–99)
Glucose-Capillary: 83 mg/dL (ref 70–99)
Glucose-Capillary: 85 mg/dL (ref 70–99)
Glucose-Capillary: 90 mg/dL (ref 70–99)
Glucose-Capillary: 91 mg/dL (ref 70–99)
Glucose-Capillary: 91 mg/dL (ref 70–99)

## 2021-12-02 LAB — BASIC METABOLIC PANEL
Anion gap: 15 (ref 5–15)
BUN: 40 mg/dL — ABNORMAL HIGH (ref 8–23)
CO2: 20 mmol/L — ABNORMAL LOW (ref 22–32)
Calcium: 7.7 mg/dL — ABNORMAL LOW (ref 8.9–10.3)
Chloride: 119 mmol/L — ABNORMAL HIGH (ref 98–111)
Creatinine, Ser: 2.25 mg/dL — ABNORMAL HIGH (ref 0.44–1.00)
GFR, Estimated: 23 mL/min — ABNORMAL LOW (ref >60)
Glucose, Bld: 99 mg/dL (ref 70–99)
Potassium: 3.6 mmol/L (ref 3.5–5.1)
Sodium: 154 mmol/L — ABNORMAL HIGH (ref 135–145)

## 2021-12-02 LAB — CBC
HCT: 28.7 % — ABNORMAL LOW (ref 36.0–46.0)
Hemoglobin: 9.2 g/dL — ABNORMAL LOW (ref 12.0–15.0)
MCH: 30.7 pg (ref 26.0–34.0)
MCHC: 32.1 g/dL (ref 30.0–36.0)
MCV: 95.7 fL (ref 80.0–100.0)
Platelets: 157 10*3/uL (ref 150–400)
RBC: 3 MIL/uL — ABNORMAL LOW (ref 3.87–5.11)
RDW: 15 % (ref 11.5–15.5)
WBC: 12.9 10*3/uL — ABNORMAL HIGH (ref 4.0–10.5)
nRBC: 0.2 % (ref 0.0–0.2)

## 2021-12-02 NOTE — Progress Notes (Signed)
STROKE TEAM PROGRESS NOTE   INTERVAL HISTORY Patient is seen in her room with her daughter and pt sisters at the bedside. She had high grade fever, AKI and hypotension yesterday, Na still high, urine output decreased. Gave 1/2NS bolus and continued on IVF. UA concerning for UTI, put on rocephin. This morning, still has low grade fever, but AKI improved, BP stable. Neuro unchanged.    Vitals:   12/02/21 1100 12/02/21 1154 12/02/21 1200 12/02/21 1300  BP: (!) 135/93 (!) 135/93 (!) 147/91 (!) 154/90  Pulse: 88 84 83 96  Resp: 20 20 20 20   Temp: 99.9 F (37.7 C) 100.2 F (37.9 C) 100.2 F (37.9 C) (!) 100.6 F (38.1 C)  TempSrc:   Esophageal   SpO2: 99% 99% 98% 99%  Weight:      Height:       CBC:  Recent Labs  Lab 12/14/2021 1554 12/18/2021 1639 11/30/21 0737 12/02/21 0444  WBC 7.1   < > 18.2* 12.9*  NEUTROABS 3.7  --   --   --   HGB 10.3*   < > 11.0* 9.2*  HCT 32.3*   < > 35.2* 28.7*  MCV 98.2   < > 99.4 95.7  PLT 177   < > 178 157   < > = values in this interval not displayed.   Basic Metabolic Panel:  Recent Labs  Lab 11/29/2021 2019 11/28/21 0607 12/01/21 0441 12/02/21 0444  NA  --    < > 157* 154*  K  --    < > 3.7 3.6  CL  --    < > 122* 119*  CO2  --    < > 20* 20*  GLUCOSE  --    < > 96 99  BUN  --    < > 37* 40*  CREATININE  --    < > 2.33* 2.25*  CALCIUM  --    < > 8.1* 7.7*  MG 1.7  --   --   --    < > = values in this interval not displayed.   Lipid Panel:  Recent Labs  Lab 11/28/21 0435 12/01/21 0441  CHOL 111  --   TRIG 96   95 104  HDL 52  --   CHOLHDL 2.1  --   VLDL 19  --   LDLCALC 40  --    HgbA1c:  Recent Labs  Lab 11/28/21 0435  HGBA1C 6.1*   Urine Drug Screen: No results for input(s): LABOPIA, COCAINSCRNUR, LABBENZ, AMPHETMU, THCU, LABBARB in the last 168 hours.  Alcohol Level  Recent Labs  Lab 11/29/2021 1403  ETH <10    IMAGING past 24 hours No results found.  PHYSICAL EXAM General:  Intubated, well-nourished,  well-developed middle-aged African American lady in no acute distress  Neurological: Patient is intubated.  Not sedated.  Comatose.  Eyes are closed, startle resonse on stimulation. Pupils right 68mm, left 4.16mm, nonreactive to light.  Corneal and oculocephalic reflexes absent.  Cough and gag reflex present.    Triple flexion in BLE to noxious stimuli present, flicker of muscle noted in left upper extremity and mild withdraw at BLEs, L>R.  No spontaneous motor movement seen.  ASSESSMENT/PLAN Ms. NARDA GRAFT is a 71 y.o. female with history of CKD, anemia, arthritis, stroke and HTN presenting with confusion and aphasia.  TNK was administered, but unfortunately, patient developed an acute hemorrhage in the lower midbrain and upper pons.  Cryoprecipitate was given, but hemorrhage was  noted to be enlarged on follow up CT.  Prognosis is poor, and discussion were had with patient's children regarding goals of care.  Family is coming from [facility] on Monday and further decisions will be made then.   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 ASPECTS 10.    CTA head & neck No LVO or hemodynamically significant stenosis.  81mm periophthalmic aneurysm and <56mm supraclinoid ICA aneurysm Repeat CT head acute 36mm hemorrhage in left aspect of pons/midbrain Second repeat CT head worsening of IPH now involving majority of brainstem and extending into ventricular system leading to acute hydrocephalus 2D Echo EF A999333, grade 1 diastolic dysfunction, interatrial septum not assessed LDL 40 HgbA1c 5.7 VTE prophylaxis - SCDs clopidogrel 75 mg daily prior to admission, now on No antithrombotic secondary to [place] Therapy recommendations:  pending Disposition:  pending, poor prognosis, family plan for comfort care tomorrow after all family arrives  Cerebral edema with acute hydrocephalus Acute hydrocephalus seen on follow up CT 3% HTS at 75cc/hr ->  NS @ 50->1/2NS @ 100 Na 158->158-> 157->154  Fever  Leukocytosis  Tmax 103.6-> 101.3->100.6 WBC 7.0->18.2->12.9 CXR subsegmental atelectasis UA WBC > 50 On rocephin  AKI Cre 1.55->2.07->2.33->2.25 S/p fluid bolus continue IV fluid @ 100 BMP monitoring  Hypertension->hypotension->stable now Home meds:  coreg 25 mg daily Stable now Increased IV fluid Keep SBP less than 160 Long-term BP goal normotensive  Hyperlipidemia Home meds:  rosuvastatin 10 mg daily LDL 40, goal < 70 Continue statin at discharge  Respiratory failure Patient was intubated due to failure to protect airway Ventilator management per CCM Not breathing over the vent  Other Stroke Risk Factors Advanced Age >/= 45  Obesity, Body mass index is 31.42 kg/m., BMI >/= 30 associated with increased stroke risk, recommend weight loss, diet and exercise as appropriate  Hx of stroke  Other Active Problems Tachycardia  Hospital day # 5  This patient is critically ill due to stroke status post TNK, brainstem ICH, IVH with hydrocephalus, respiratory failure, AKI, fever and leukocytosis and at significant risk of neurological worsening, death form brain herniation, obstructive hydrocephalus, renal failure, sepsis, heart failure. This patient's care requires constant monitoring of vital signs, hemodynamics, respiratory and cardiac monitoring, review of multiple databases, neurological assessment, discussion with family, other specialists and medical decision making of high complexity. I spent 35 minutes of neurocritical care time in the care of this patient. I had long discussion with daughter and pt sisters at bedside, updated pt current condition, treatment plan and potential prognosis, and answered all the questions.  They expressed understanding and appreciation.  Rosalin Hawking, MD PhD Stroke Neurology 12/02/2021 2:57 PM        To contact Stroke Continuity provider, please refer to [email or web address] After hours,  contact General Neurology

## 2021-12-02 NOTE — Progress Notes (Signed)
NAME:  CHARRON SCHOENWALD, MRN:  [id number], DOB:  04/27/1951, LOS: 5 ADMISSION DATE:  12/13/2021, CONSULTATION DATE:  12/17/2021 REFERRING MD:  Dr. Theda Sers, CHIEF COMPLAINT:  Aphasia    History of Present Illness:  HPI obtained from medical chart review as patient is unable to provide due to altered mental status.   71 year old female with PMH as below who presented to ER with complaints of altered mental status.  EMS was called after patient was not answering door for physical therapy.  She was seen two days ago for hip pain and started on oxycodone and trazadone for sleep.  She was alert and oriented x1 on EMS arrival, LSW 1/31 around 0900, but improved by arrival to triage and was alert and oriented x 4.  While in triage, she then became severely aphasic, and noted to have right sided weakness.  A code stroke was activated and taken for [facility]  which was negative for acute ICH or acute infarction.   She was given TNK full dose at 1452.    She then was noted to have declining mental status.  Taken for repeat [facility] which showed an 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  She was reversed with TXA and started on cryo.  She was transferred to the ICU, where she was unable to protect her airway and emergently intubated on arrival.    Pertinent  Medical History  RA, CKD3b, anemia, HTN, CVA, diverticulitis, HLD, ascending aortic aneurysm   Significant Hospital Events: Including procedures, antibiotic start and stop dates in addition to other pertinent events   2/1 presented with aphasia and right sided weakness.  CTH neg.  S/p TNK complicated by IPH s/p TXA and cryo and requiring intubation for airway protection.    12/21/2021 CTH:  There is no acute intracranial hemorrhage or evidence of acute infarction. ASPECT score is 10. CTA neck:  The common carotid, internal carotid and vertebral arteries are patent within the neck without stenosis.  Aortic Atherosclerosis.   CTA head:   No intracranial large vessel occlusion or proximal high-grade arterial stenosis identified.  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 Repeat CTH post TNK showed an 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  Interim History / Subjective:   No acute issues. Pt's sisters have arrived today.   Objective   Blood pressure 140/87, pulse 87, temperature 99.1 F (37.3 C), resp. rate 20, height 5\' 7"  (1.702 m), weight 91 kg, SpO2 99 %.    Vent Mode: PRVC FiO2 (%):  [40 %] 40 % Set Rate:  [20 bmp] 20 bmp Vt Set:  [490 mL] 490 mL PEEP:  [5 cmH20] 5 cmH20 Plateau Pressure:  [15 cmH20-17 cmH20] 17 cmH20   Intake/Output Summary (Last 24 hours) at 12/02/2021 0931 Last data filed at 12/02/2021 0600 Gross per 24 hour  Intake 2929.3 ml  Output 2125 ml  Net 804.3 ml   Filed Weights   12/16/2021 1403  Weight: 91 kg   Examination: General appearance: 71 y.o., female, intubated Eyes: pupils equal not reactive HENT: NCAT; MMM Lungs: mech breath sounds bl, +triggering CV: RRR, no murmur  Abdomen: Soft, non-tender; non-distended, BS  hypoactive Extremities: No peripheral edema, warm Skin: Normal turgor and texture; no rash Neuro: withdraws vs extensor response to noxious stim [facility]  Problem list    Assessment & Plan:    L MCA CVA s/p TNK, complicated by brainstem IPH, 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 Concern for downward herniation -s/p TXA, cryo P -neuro primary -poor prognosis has been d/w family -- DNR, no escalation of care. Anticipate likely transition to comfort care early this week  Endotracheal tube present due to acute respiratory  insufficiency with poor airway protection due to stroke, requiring MV  -cont MV support, likely compassionate extubation when family arrives -VAP, pulm hygiene   GOC, DNR status -Neurology has discussed [place] with family 2/3 -- pt will be DNR with no escalation of care. Pt's sisters have arrived and plan to speak with neurology today.     Best Practice (right click and "Reselect all SmartList Selections" daily)   Diet/type: NPO DVT prophylaxis: SCD GI prophylaxis: PPI Lines: N/A Foley:  N/A Code Status:  DNR  Last date of multidisciplinary goals of care discussion [2/3  -- [place] with family no escalation, DNR]  CRITICAL CARE N/a   [facility]

## 2021-12-03 DIAGNOSIS — I639 Cerebral infarction, unspecified: Secondary | ICD-10-CM | POA: Diagnosis not present

## 2021-12-03 LAB — CBC
HCT: 33.9 % — ABNORMAL LOW (ref 36.0–46.0)
Hemoglobin: 11.1 g/dL — ABNORMAL LOW (ref 12.0–15.0)
MCH: 29.2 pg (ref 26.0–34.0)
MCHC: 32.7 g/dL (ref 30.0–36.0)
MCV: 89.2 fL (ref 80.0–100.0)
Platelets: 248 10*3/uL (ref 150–400)
RBC: 3.8 MIL/uL — ABNORMAL LOW (ref 3.87–5.11)
RDW: 13.5 % (ref 11.5–15.5)
WBC: 10.2 10*3/uL (ref 4.0–10.5)
nRBC: 0 % (ref 0.0–0.2)

## 2021-12-03 LAB — GLUCOSE, CAPILLARY
Glucose-Capillary: 119 mg/dL — ABNORMAL HIGH (ref 70–99)
Glucose-Capillary: 89 mg/dL (ref 70–99)

## 2021-12-03 LAB — BASIC METABOLIC PANEL
Anion gap: 16 — ABNORMAL HIGH (ref 5–15)
BUN: 43 mg/dL — ABNORMAL HIGH (ref 8–23)
CO2: 21 mmol/L — ABNORMAL LOW (ref 22–32)
Calcium: 7.7 mg/dL — ABNORMAL LOW (ref 8.9–10.3)
Chloride: 120 mmol/L — ABNORMAL HIGH (ref 98–111)
Creatinine, Ser: 2.24 mg/dL — ABNORMAL HIGH (ref 0.44–1.00)
GFR, Estimated: 23 mL/min — ABNORMAL LOW (ref >60)
Glucose, Bld: 109 mg/dL — ABNORMAL HIGH (ref 70–99)
Potassium: 3.6 mmol/L (ref 3.5–5.1)
Sodium: 157 mmol/L — ABNORMAL HIGH (ref 135–145)

## 2021-12-03 MED ORDER — ACETAMINOPHEN 650 MG RE SUPP: 650.0000 mg | Freq: Four times a day (QID) | RECTAL | Status: DC | PRN

## 2021-12-03 MED ORDER — MORPHINE SULFATE (PF) 2 MG/ML IV SOLN: 2.0000 mg | INTRAVENOUS | Status: DC | PRN

## 2021-12-03 MED ORDER — MIDAZOLAM HCL 2 MG/2ML IJ SOLN: 2.0000 mg | INTRAMUSCULAR | Status: DC | PRN

## 2021-12-03 MED ORDER — GLYCOPYRROLATE 0.2 MG/ML IJ SOLN
0.2000 mg | INTRAMUSCULAR | Status: DC | PRN
  Administered 2021-12-03: 0.2 mg via INTRAVENOUS
  Filled 2021-12-03: qty 1

## 2021-12-03 MED ORDER — ACETAMINOPHEN 325 MG PO TABS: 650.0000 mg | ORAL_TABLET | Freq: Four times a day (QID) | ORAL | Status: DC | PRN

## 2021-12-03 MED ORDER — MORPHINE 100MG IN NS 100ML (1MG/ML) PREMIX INFUSION
0.0000 mg/h | INTRAVENOUS | Status: DC
  Administered 2021-12-03: 10 mg/h via INTRAVENOUS
  Administered 2021-12-03: 5 mg/h via INTRAVENOUS
  Filled 2021-12-03 (×2): qty 100

## 2021-12-03 MED ORDER — POLYVINYL ALCOHOL 1.4 % OP SOLN
1.0000 [drp] | Freq: Four times a day (QID) | OPHTHALMIC | Status: DC | PRN
  Filled 2021-12-03: qty 15

## 2021-12-03 MED ORDER — DIPHENHYDRAMINE HCL 50 MG/ML IJ SOLN: 25.0000 mg | INTRAMUSCULAR | Status: DC | PRN

## 2021-12-03 MED ORDER — GLYCOPYRROLATE 1 MG PO TABS
1.0000 mg | ORAL_TABLET | ORAL | Status: DC | PRN
  Filled 2021-12-03: qty 1

## 2021-12-03 MED ORDER — GLYCOPYRROLATE 0.2 MG/ML IJ SOLN: 0.2000 mg | INTRAMUSCULAR | Status: DC | PRN

## 2021-12-03 MED ORDER — MORPHINE BOLUS VIA INFUSION
5.0000 mg | INTRAVENOUS | Status: DC | PRN
  Filled 2021-12-03: qty 5

## 2021-12-03 MED ORDER — DEXTROSE 5 % IV SOLN: INTRAVENOUS | Status: DC

## 2021-12-05 IMAGING — CT CT ABD-PELV W/O CM
2 of 4 series · 15 of 46 positions shown, 17 images · non-contrast
Comparison: March 16, 2020

CLINICAL DATA: Abdominal distension.  Nausea and vomiting.

EXAM:
CT ABDOMEN AND PELVIS WITHOUT CONTRAST
TECHNIQUE: Multidetector CT imaging of the abdomen and pelvis was performed
following the standard protocol without IV contrast.

[Series 3: a/p w/o 5mm · axial · non-contrast · 0.84mm/px · z∈[-444,-19]mm · 12 of 97 slices shown, 14 images]
[im 8/97  soft-tissue]
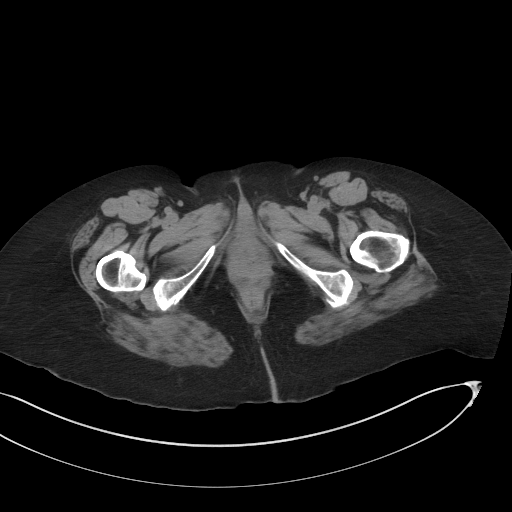
[im 8/97  bone]
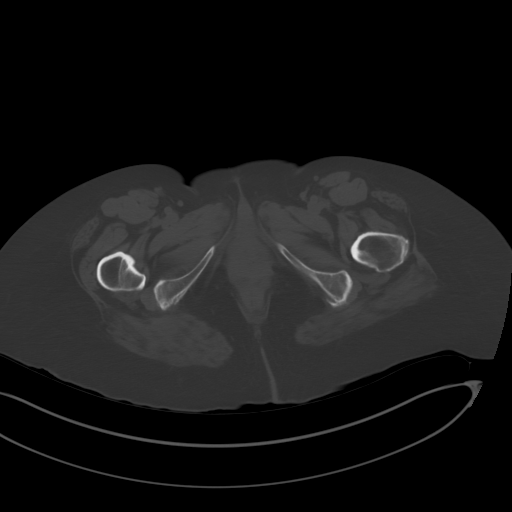
[im 16/97  soft-tissue]
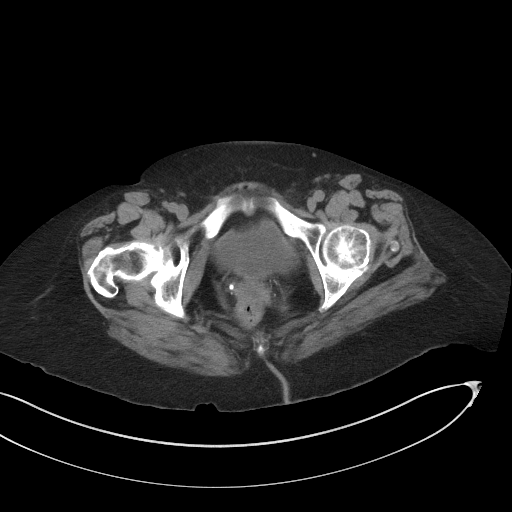
[im 24/97  soft-tissue]
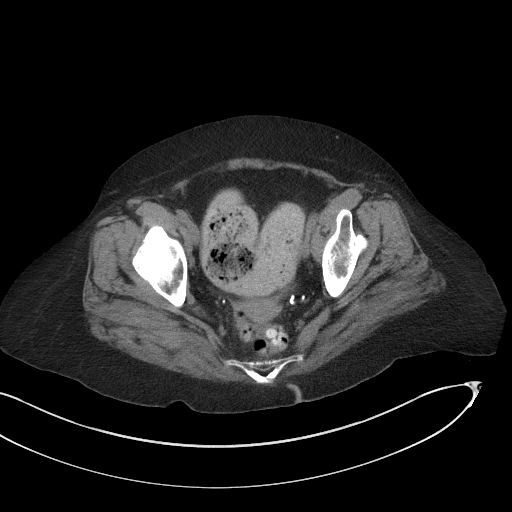
[im 31/97  soft-tissue]
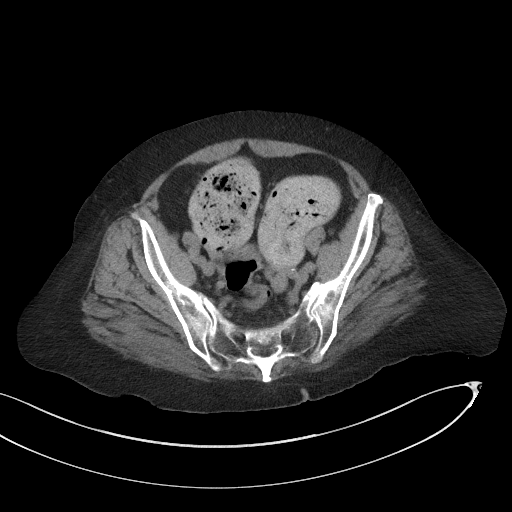
[im 39/97  soft-tissue]
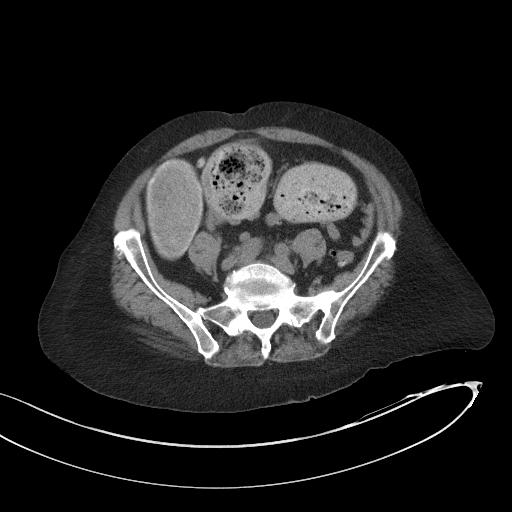
[im 47/97  soft-tissue]
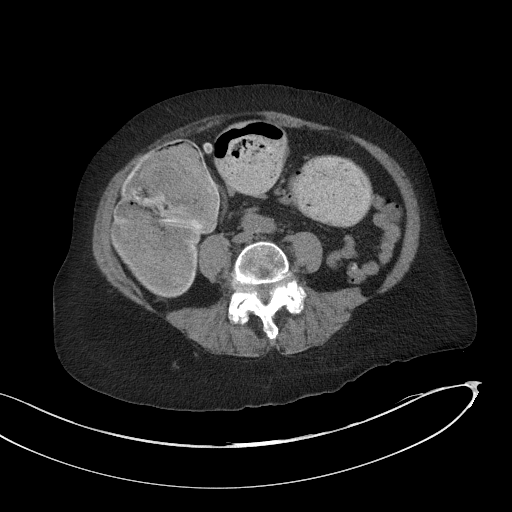
[im 54/97  soft-tissue]
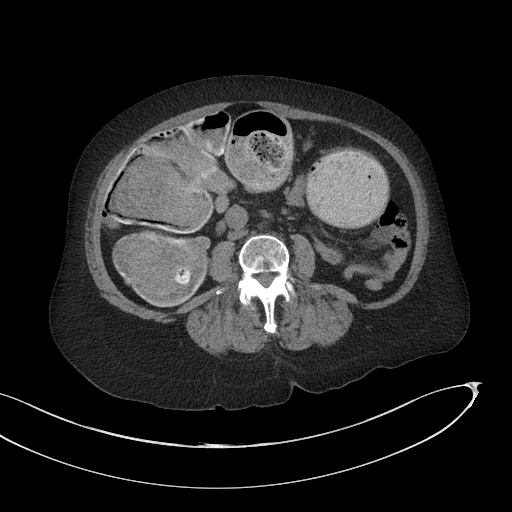
[im 62/97  soft-tissue]
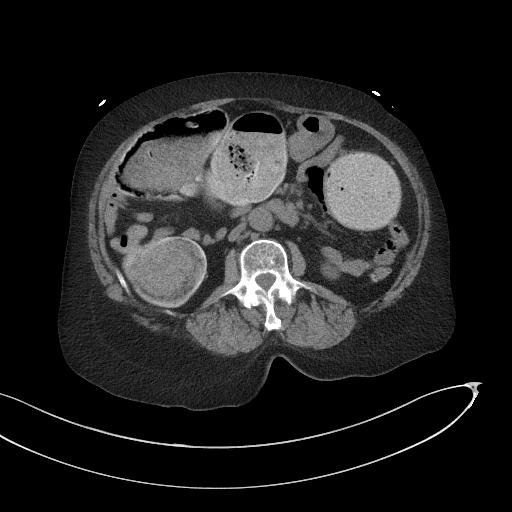
[im 70/97  soft-tissue]
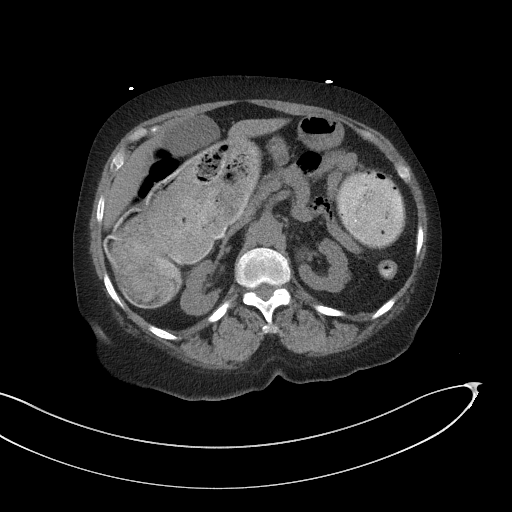
[im 70/97  bone]
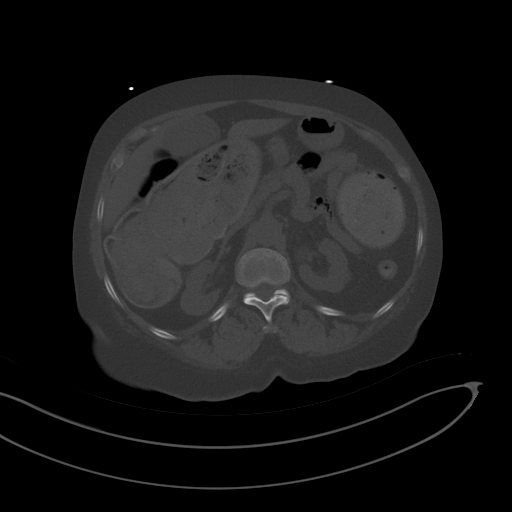
[im 77/97  soft-tissue]
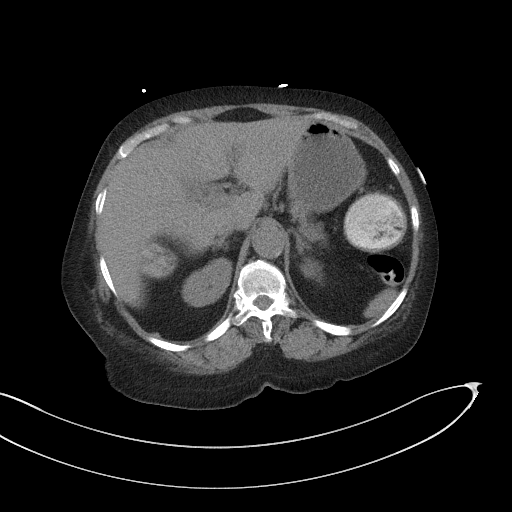
[im 85/97  soft-tissue]
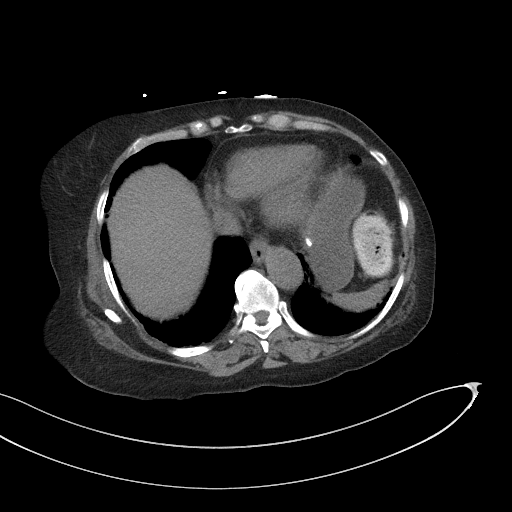
[im 93/97  soft-tissue]
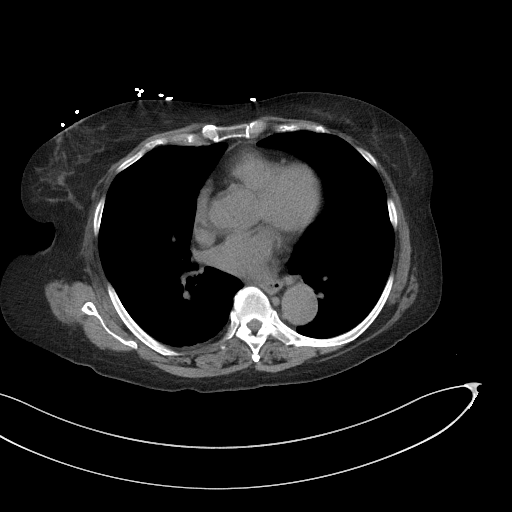

[Series 6: a/p w/o cor · coronal · non-contrast · 0.66mm/px · 3 of 150 slices shown]
[im 50/150  soft-tissue]
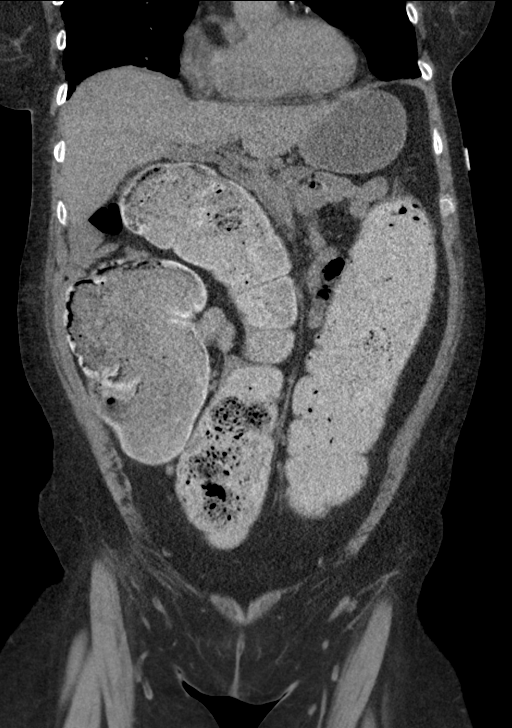
[im 67/150  soft-tissue]
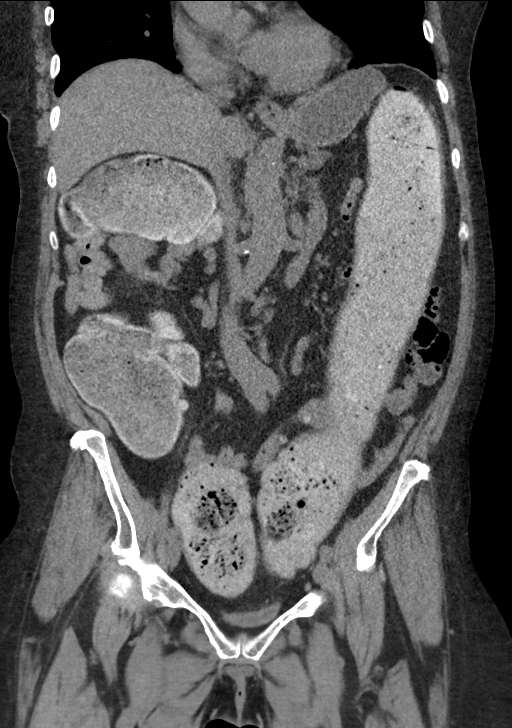
[im 83/150  soft-tissue]
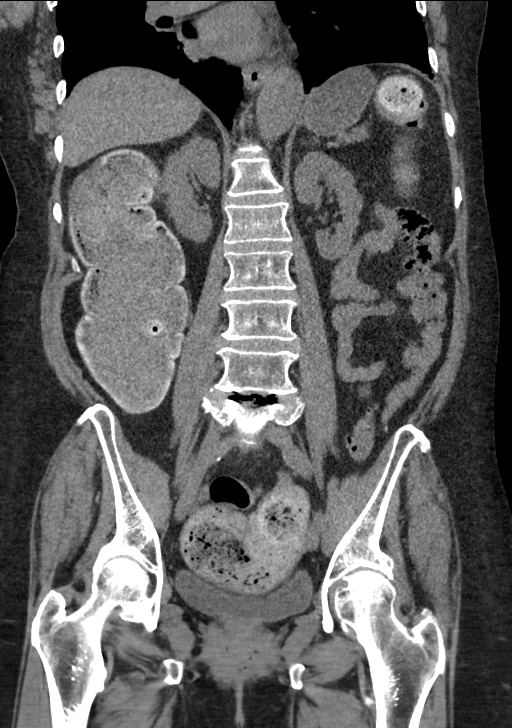

[15 of 46 positions shown; findings below may reference images not displayed]

FINDINGS: Lower chest: No acute abnormality.

Hepatobiliary: No focal liver abnormality is seen. No gallstones,
gallbladder wall thickening, or biliary dilatation.

Pancreas: Unremarkable. No pancreatic ductal dilatation or
surrounding inflammatory changes.

Spleen: Normal in size without focal abnormality.

Adrenals/Urinary Tract: Adrenal glands are unremarkable. Kidneys are
normal, without renal calculi, focal lesion, or hydronephrosis.
Bladder is unremarkable.

Stomach/Bowel: The stomach and small bowel are normal. The
descending colon and sigmoid colon are relatively decompressed.
There are scattered diverticuli but no diverticulitis. The colon is
markedly distended with stool from just beyond the splenic flexure
through the level of the cecum. The cecum measures up to 9 cm in
diameter. There is gas outlining the stool in the distal cecum and
proximal ascending colon. This gas extends non dependently and is
different in appearance compared to the remainder of the colon. No
definite wall thickening. No venous gas. The appendix is normal.

Vascular/Lymphatic: No significant vascular findings are present. No
enlarged abdominal or pelvic lymph nodes.

Reproductive: Uterus and bilateral adnexa are unremarkable.

Other: No free air or free fluid.

Musculoskeletal: A compression fracture of L5 with concavity of the
superior endplate was also present in Tuesday February, 2020 but may be
slightly worsened in the interval. No other acute bony
abnormalities.
IMPRESSION: 1. Marked distension of the colon from the level of the cecum
through the splenic flexure with an abrupt transition point just
beyond the splenic flexure. Beyond the transition point, the colon
is decompressed. The findings are concerning for a colonic
obstruction although an underlying cause is not seen on this study.
Recommend colonoscopy to better evaluate the region of transition
point.
2. The pattern of gas outlining the stool in the cecum and ascending
colon extending dependently raises the possibility of pneumatosis.
No definite wall thickening or adjacent fat stranding. No free air.
3. A compression fracture at L5 was present in Tuesday February, 2020 but
appears to have mildly worsened.

Findings were called to Dr. Farahdokht.

## 2021-12-05 IMAGING — DX DG ABD PORTABLE 1V
1 series · 1 of 1 positions shown · non-contrast
Comparison: 05/06/2020

CLINICAL DATA: Enteric catheter placement

EXAM:
PORTABLE ABDOMEN - 1 VIEW

[abdomen]
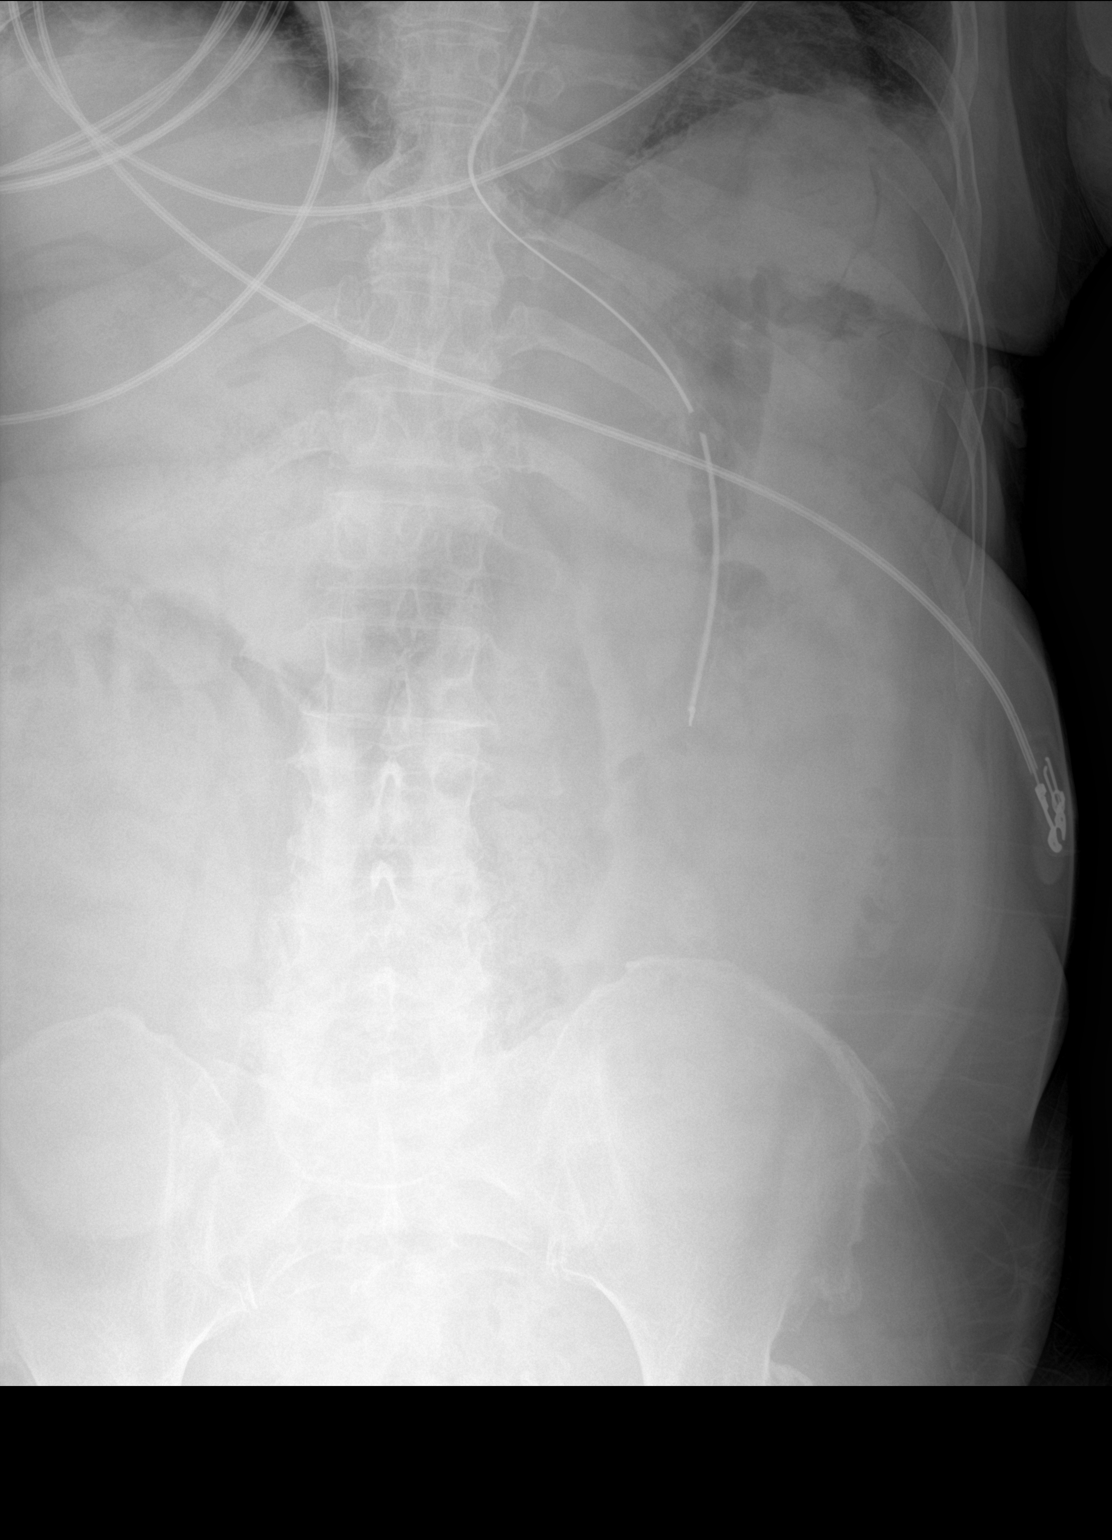

[1 of 1 positions shown; findings below may reference images not displayed]

FINDINGS: Frontal view of the lower chest and upper abdomen excludes the right
hemidiaphragm and lower pelvis by collimation. Tip and side port of
an enteric catheter project over the gastric body. Large amount of
stool within the proximal colon again noted.
IMPRESSION: 1. Enteric catheter overlying gastric body.

## 2021-12-06 IMAGING — DX DG ABD PORTABLE 1V
2 series · 2 of 2 positions shown · non-contrast
Comparison: Abdominal radiograph dated 05/06/2020.

CLINICAL DATA: 69-year-old female with small bowel obstruction.

EXAM:
PORTABLE ABDOMEN - 1 VIEW

[abdomen kub (1 of 2)]
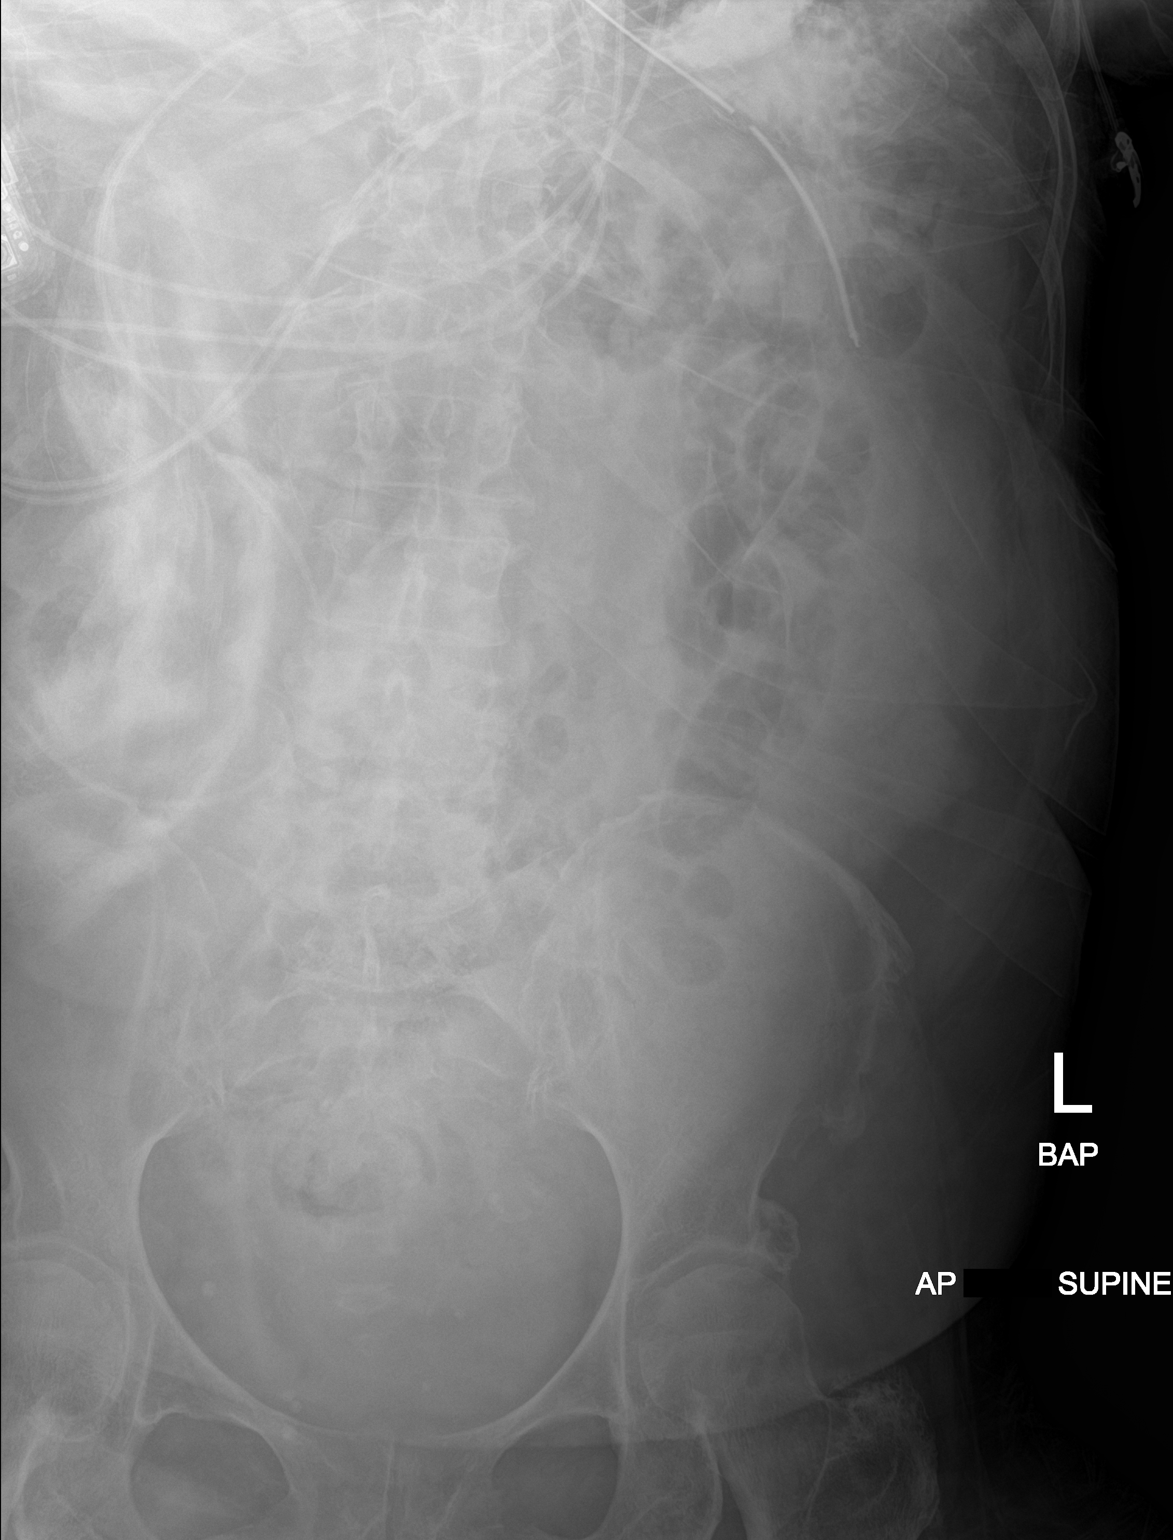

[abdomen kub (2 of 2)]
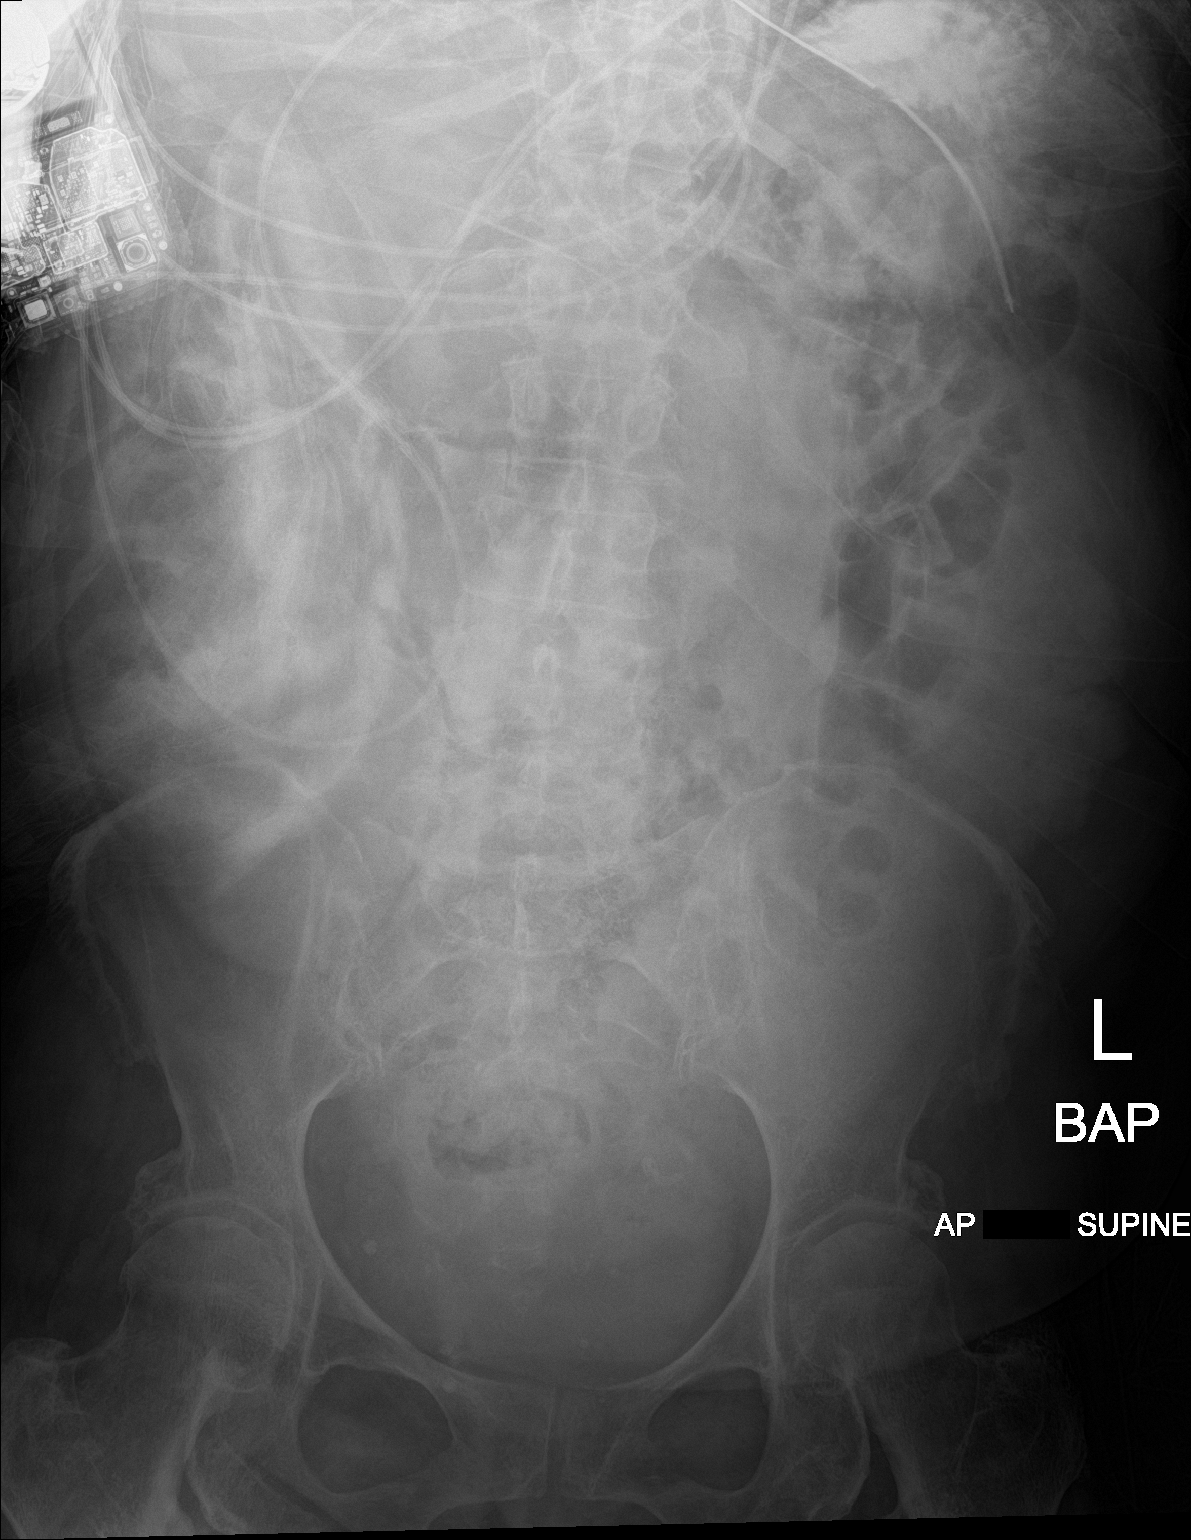

[2 of 2 positions shown; findings below may reference images not displayed]

FINDINGS: Partially visualized enteric tube with tip and side-port in the left
upper abdomen likely in the proximal stomach. Partially visualized
contrast in the stomach. Faint linear high density over the right
hemiabdomen, possibly external to the patient and contamination. Air
is noted within the colon. No definite bowel dilatation. The soft
tissues and osseous structures are grossly unremarkable.
IMPRESSION: Enteric tube with tip and side-port in the proximal stomach. No
significant interval change.

## 2021-12-06 IMAGING — DX DG ABD PORTABLE 1V
1 series · 1 of 1 positions shown · non-contrast
Comparison: None.

CLINICAL DATA: Small bowel obstruction, NG tube

EXAM:
PORTABLE ABDOMEN - 1 VIEW

[abdomen kub]
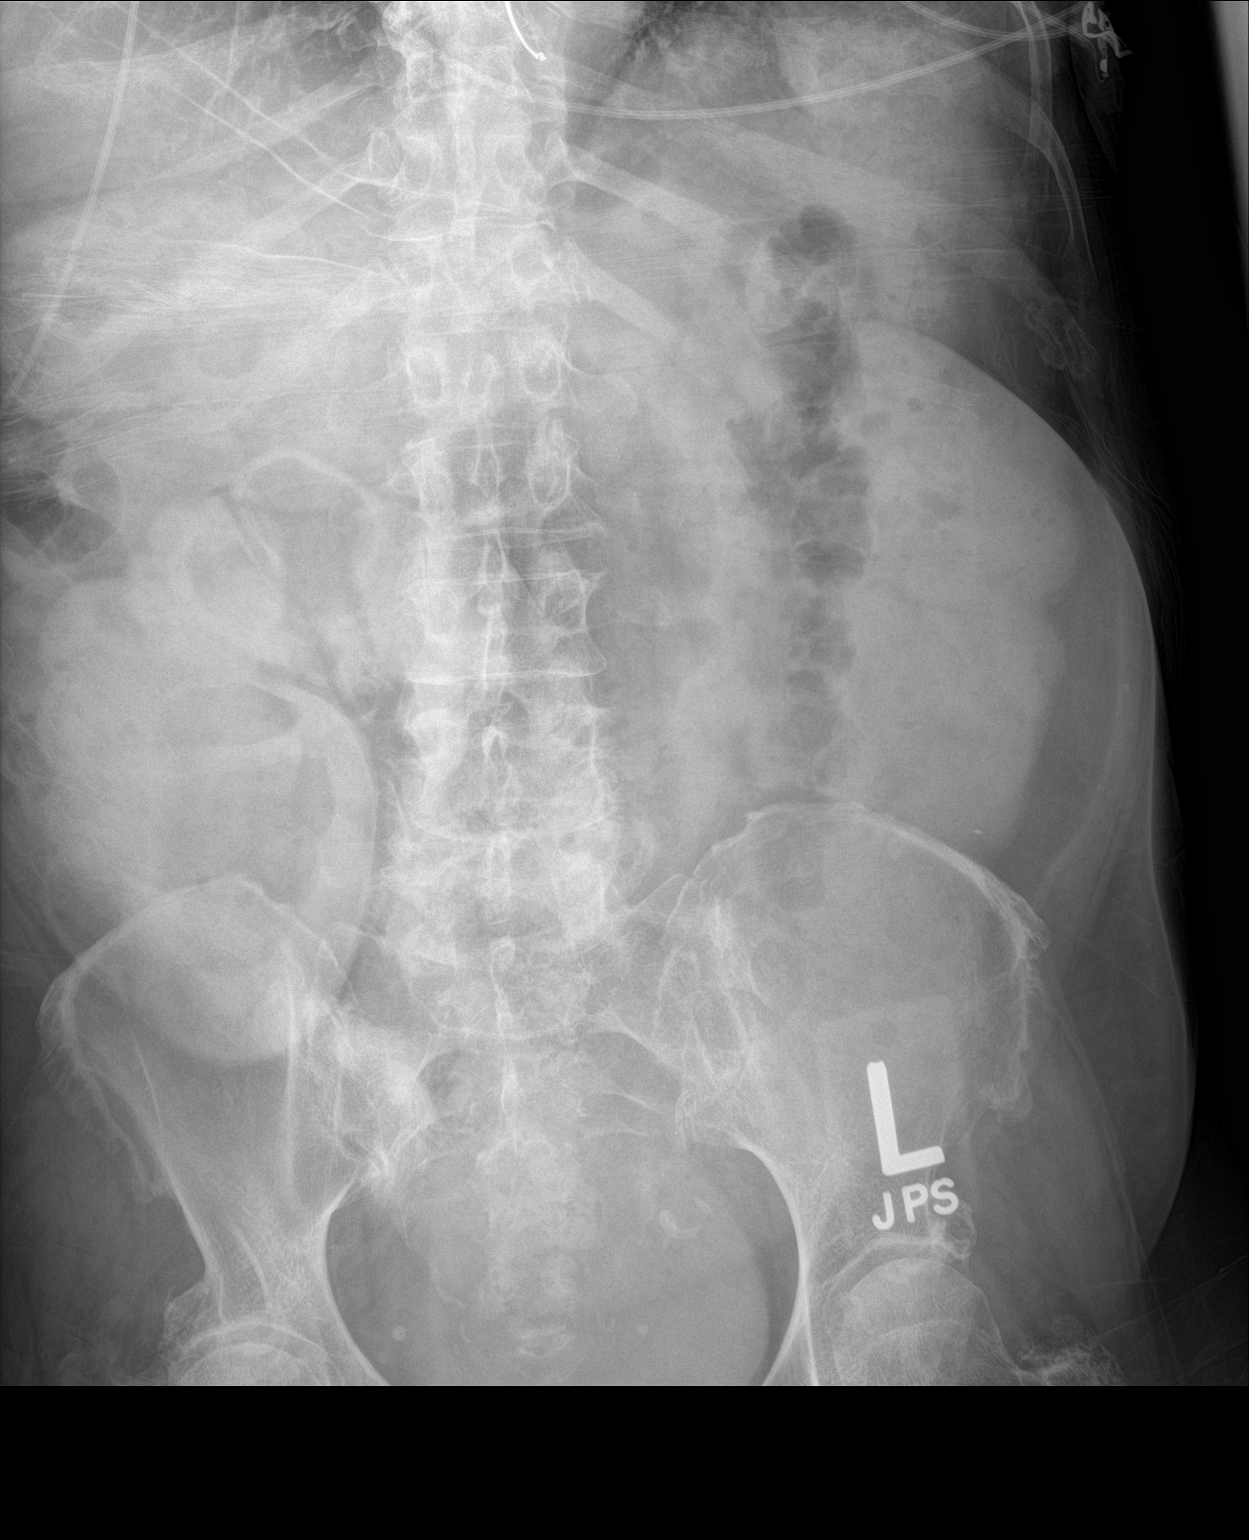

[1 of 1 positions shown; findings below may reference images not displayed]

FINDINGS: NG tube tip is in the distal esophagus. Large stool burden within
the colon. No free air organomegaly.
IMPRESSION: NG tube tip in the distal esophagus.

## 2021-12-06 IMAGING — DX DG CHEST 1V PORT
1 series · 1 of 1 positions shown · non-contrast
Comparison: November 07, 2019.

CLINICAL DATA: Shortness of breath.

EXAM:
PORTABLE CHEST 1 VIEW

[chest ap]
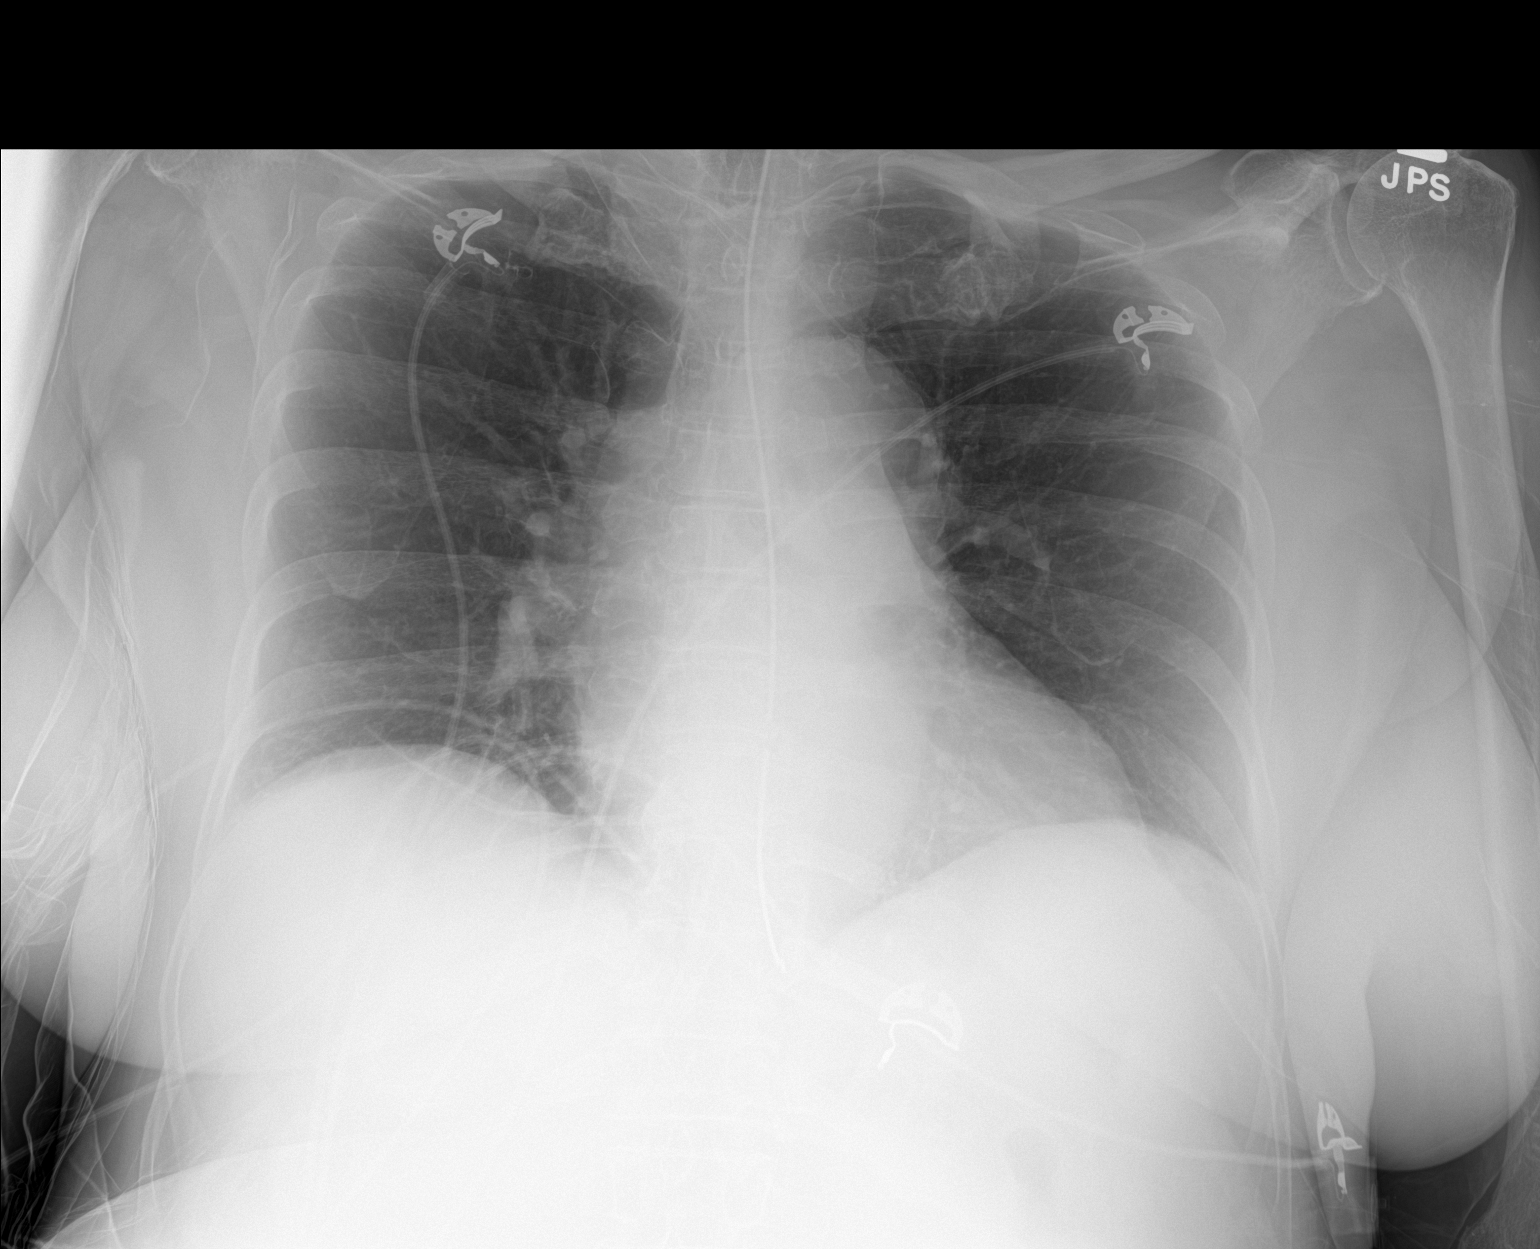

[1 of 1 positions shown; findings below may reference images not displayed]

FINDINGS: The heart size and mediastinal contours are within normal limits.
Both lungs are clear. No pneumothorax or pleural effusion is noted.
Nasogastric tube tip is seen at the gastroesophageal junction. The
visualized skeletal structures are unremarkable.
IMPRESSION: No active disease. Nasogastric tube tip seen at gastroesophageal
junction. Advancement is recommended.

## 2021-12-07 IMAGING — RF DG BE SINGLE CONTRAST
14 of 18 series · 14 of 18 positions shown · non-contrast
Comparison: Plain film 05/07/2020. CT 05/06/2020. Colonoscopy from
10/05/2019.

CLINICAL DATA: Clinical constipation. Abdominal pain. CT
demonstrating colonic distension to the level of a transition point
just beyond the splenic flexure. Prior colonoscopy demonstrating an
area of inflamed mucosa within the distal transverse colon (negative
biopsy).

EXAM:
WATER SOLUBLE CONTRAST ENEMA
TECHNIQUE: Single-contrast enema performed with limitations secondary to
patient immobility and clinical status.
FLUOROSCOPY TIME:  Fluoroscopy Time:  4 minutes and 30 seconds
Radiation Exposure Index (if provided by the fluoroscopic device):
Not applicable.
Number of Acquired Spot Images: 0

[Series 1: t abdomen supine · 0.15mm/px · 1 of 1 slices shown]
[im 1/1]
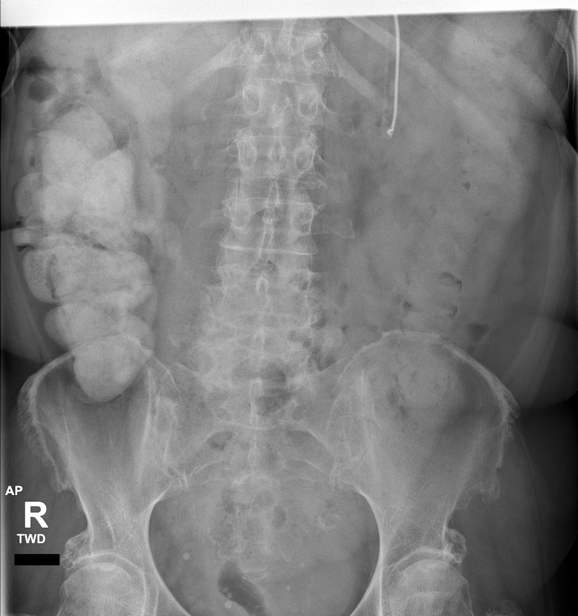

[Series 2: cp_standard · 0.25mm/px · 1 of 1 slices shown (1 of 11)]
[im 1/1]
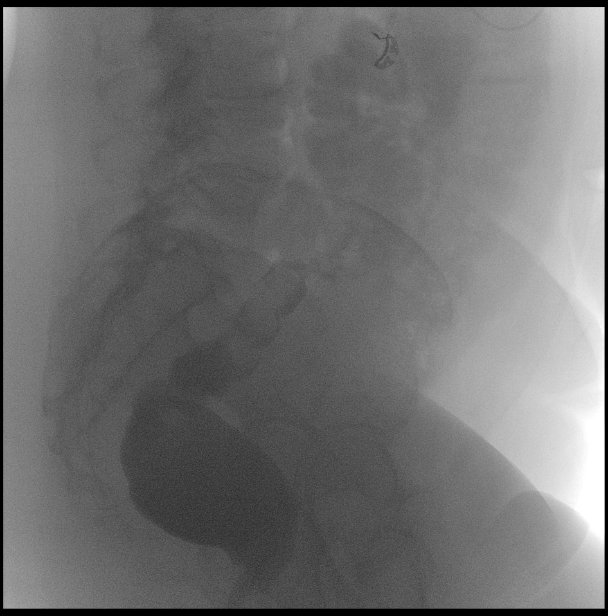

[Series 4: cp_standard · 0.25mm/px · 1 of 1 slices shown (2 of 11)]
[im 1/1]
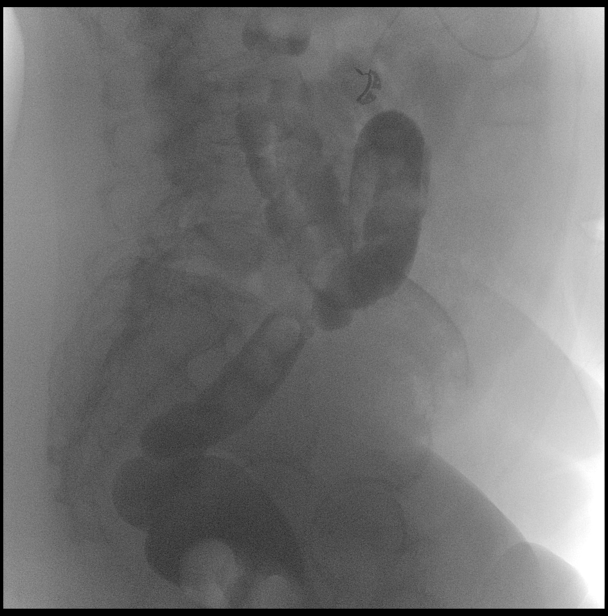

[Series 5: cp_standard · 0.25mm/px · 1 of 1 slices shown (3 of 11)]
[im 1/1]
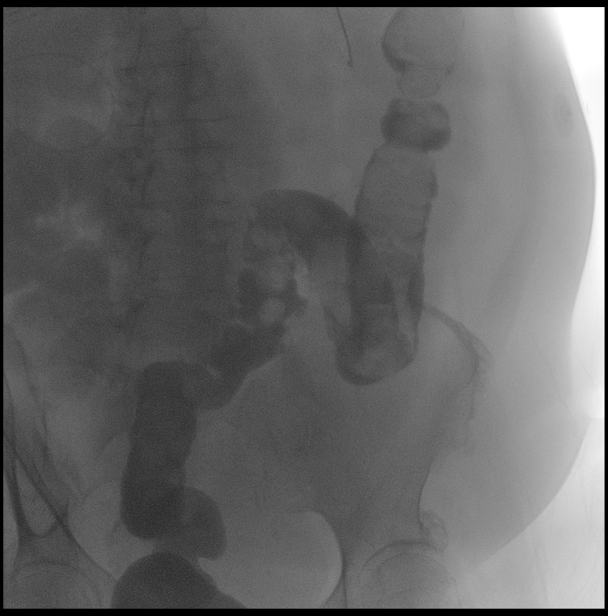

[Series 6: cp_standard · 0.25mm/px · 1 of 1 slices shown (4 of 11)]
[im 1/1]
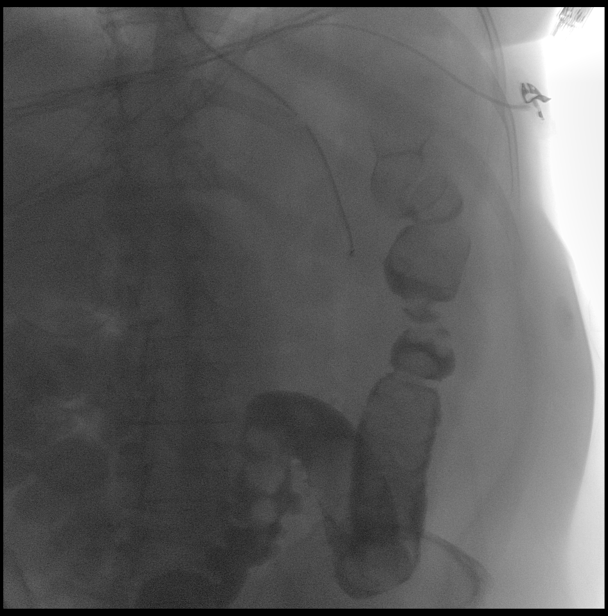

[Series 8: cp_standard · 0.25mm/px · 1 of 1 slices shown (5 of 11)]
[im 1/1]
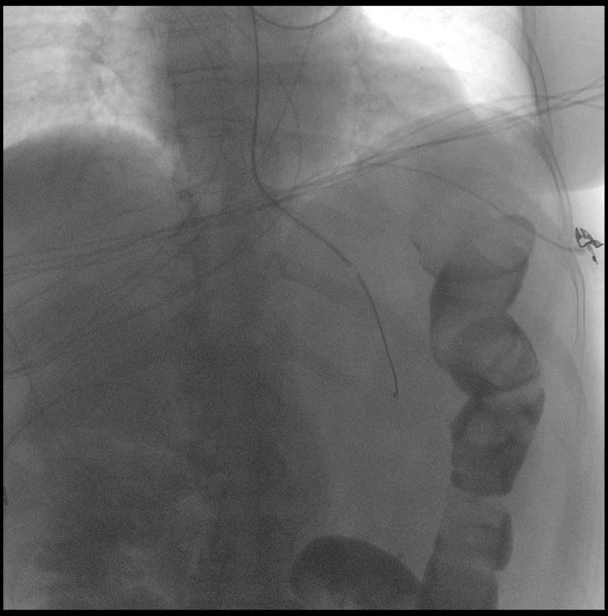

[Series 9: cp_standard · 0.25mm/px · 1 of 1 slices shown (6 of 11)]
[im 1/1]
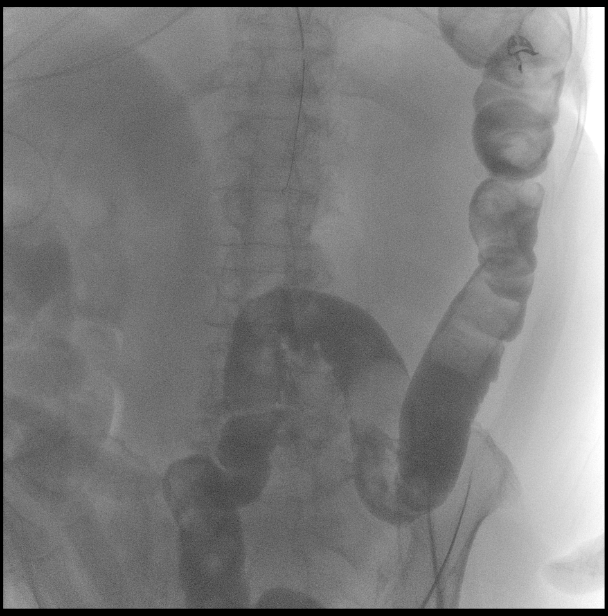

[Series 10: cp_standard · 0.26mm/px · 1 of 1 slices shown (7 of 11)]
[im 1/1]
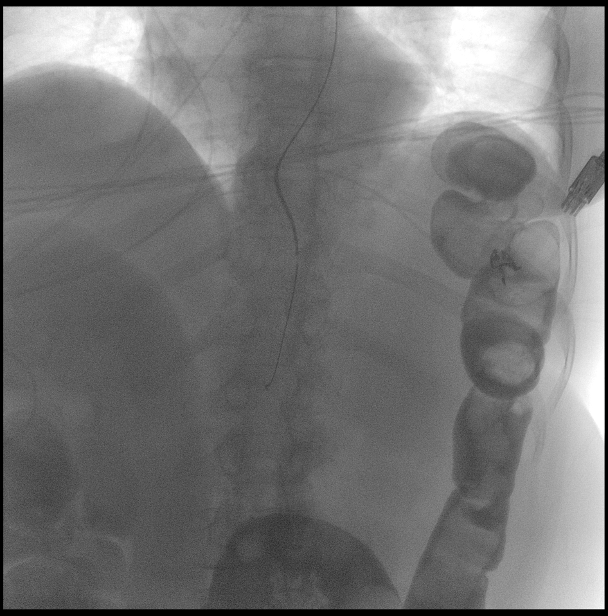

[Series 11: cp_standard · 0.25mm/px · 1 of 1 slices shown (8 of 11)]
[im 1/1]
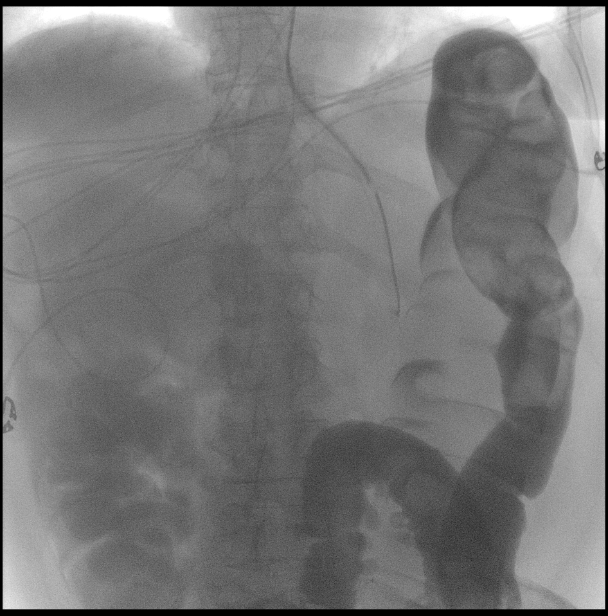

[Series 13: cp_standard · 0.25mm/px · 1 of 1 slices shown (9 of 11)]
[im 1/1]
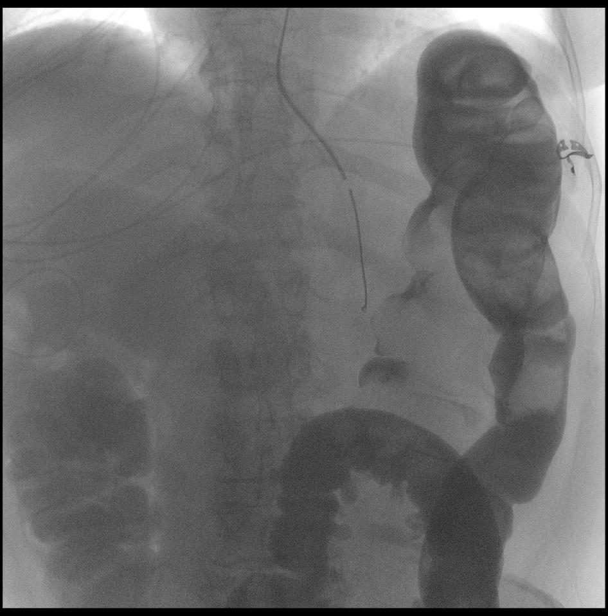

[Series 14: cp_standard · 0.25mm/px · 1 of 1 slices shown (10 of 11)]
[im 1/1]
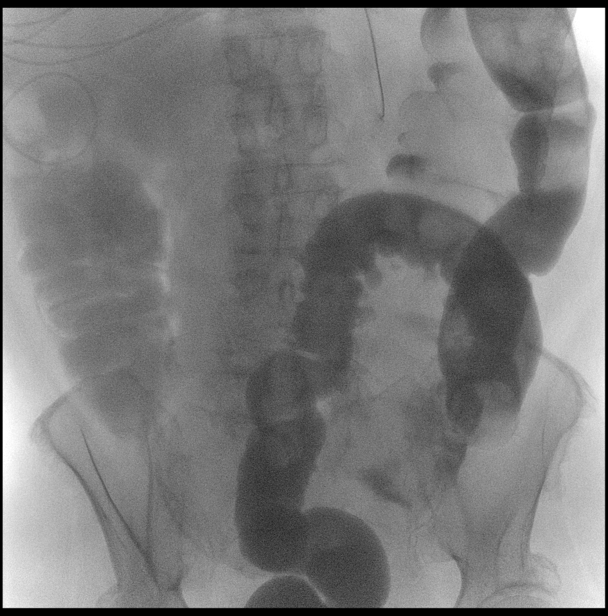

[Series 15: cp_standard · 0.25mm/px · 1 of 1 slices shown (11 of 11)]
[im 1/1]
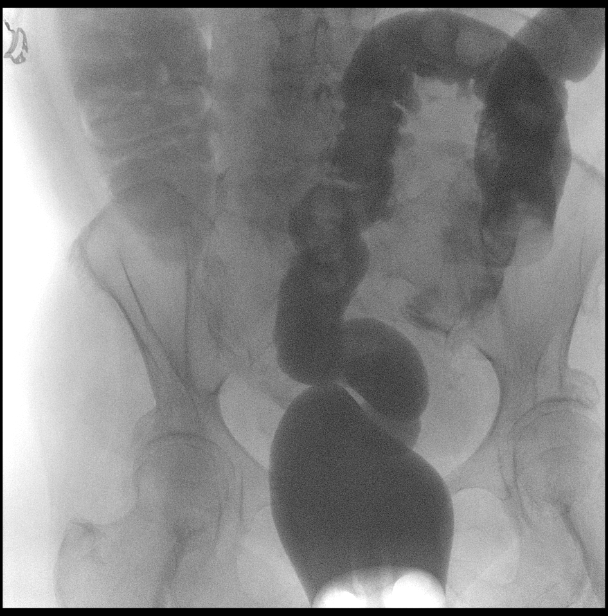

[Series 17: fluoro_iodine 2fps_bw · 0.20mm/px · 1 of 1 slices shown (1 of 2)]
[im 1/1]
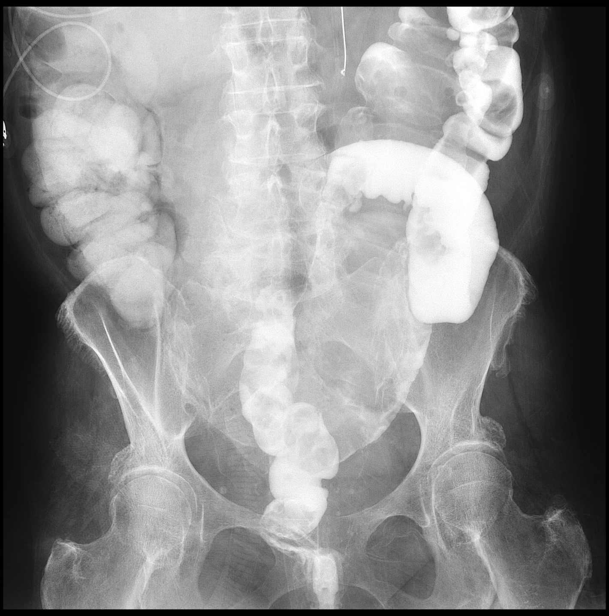

[Series 18: fluoro_iodine 2fps_bw · 0.20mm/px · 1 of 1 slices shown (2 of 2)]
[im 1/1]
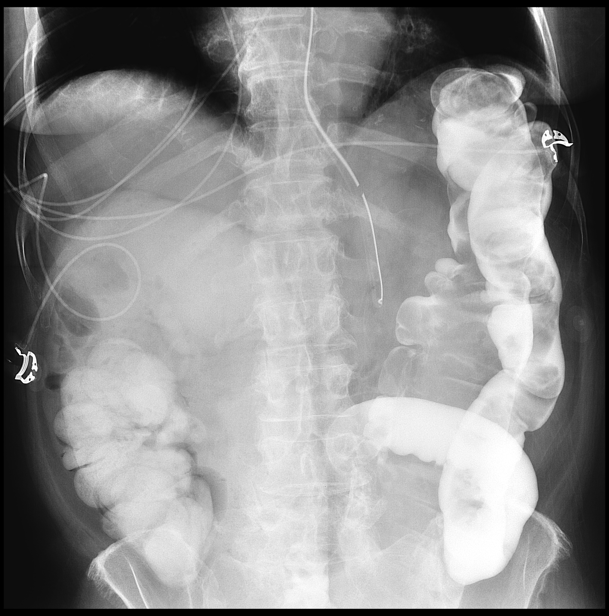

[14 of 18 positions shown; findings below may reference images not displayed]

FINDINGS: Preprocedure scout film demonstrates a large amount of stool
throughout the colon. Contrast and stool within the ascending colon.
Nasogastric tube terminating over the gastric body.

Rectal contrast administration demonstrates large amount of stool
throughout the colon. No dominant stricture identified within the
descending or sigmoid colon. Scattered colonic diverticula. Contrast
partially opacifies to the level of the mid transverse colon, which
is primarily stool-filled and moderately dilated. Due to patient
discomfort, the more proximal colon could not be opacified.
IMPRESSION: Limited, single-contrast exam, as detailed above. Contrast opacifies
to the level of the mid transverse colon, which is primarily
stool-filled. Given above limitations, no dominant stricture
identified.

## 2021-12-25 NOTE — Death Summary Note (Signed)
DEATH SUMMARY   Patient Details  Name: Michelle Graves MRN: [id number] DOB: 01-15-1951 [id number], [facility]  Admission/Discharge Information   Admit Date:  12-06-2021  Date of Death: Date of Death: 12/12/21  Time of Death: Time of Death: Feb 12, 2015  Length of Stay: 6   Principle Cause of death:  Stroke s/p TNK with ICH in brainstem and IVH with hydrocephalus  Hospital Diagnoses: Principal Problem:   Acute ischemic stroke [facility]) s/p TNK   Hemorrhagic conversion   IVH   Hydrocephalus   Hypertensive emergency   Cerebral edema   Leukocytosis   Fever    AKI   Respiratory failure   HLD   Hospital Course: Michelle Graves is a 71 y.o. female with history of CKD, anemia, arthritis, stroke and HTN presenting with confusion and aphasia.  TNK was administered, but unfortunately, patient developed an acute hemorrhage in the lower midbrain and upper pons.  Cryoprecipitate was given, but hemorrhage was noted to be enlarged on follow up CT.  Prognosis is poor, and discussion were made with patient's children regarding goals of care.     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 ASPECTS 10.    CTA head & neck No LVO or hemodynamically significant stenosis.  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 Pt passed away on Dec 12, 2021 at 8:16pm   Cerebral edema with acute hydrocephalus Acute  hydrocephalus seen on follow up CT 3% HTS at 75cc/hr -> NS @ 50->1/2NS @ 100 Na 158->158-> 157->154   Fever  Leukocytosis  Tmax 103.6-> 101.3->100.6-> 97.5 WBC 7.0->18.2->12.9 CXR subsegmental atelectasis UA WBC > 48 Was treated with rocephin   AKI Cre 1.55->2.07->2.33->2.25-> 2.24 S/p fluid bolus Was on IV fluid @ 100  Hypertension->hypotension->stable now Home meds:  coreg 25 mg daily Stable Was kept SBP less than 160   Hyperlipidemia Home meds:  rosuvastatin 10 mg daily LDL 40, goal < 70   Respiratory failure Patient was intubated due to failure to protect airway Ventilator management per CCM Not breathing over the vent   Other Stroke Risk Factors Advanced Age >/= 49  Obesity, Body mass index is 31.42 kg/m., BMI >/= 30 associated with increased stroke risk, recommend weight loss, diet and exercise as appropriate  Hx of stroke   Other Active Problems Tachycardia   Procedures: intubation  Consultations: CCM  The results of significant diagnostics from this hospitalization (including imaging, microbiology, ancillary and laboratory) are listed below for reference.   Significant Diagnostic Studies: CT HEAD WO CONTRAST  Result Date: 06-Dec-2021 CLINICAL DATA:  Stroke, hemorrhagic EXAM: CT HEAD WITHOUT CONTRAST TECHNIQUE: Contiguous axial images were obtained from the base of the skull through the vertex without intravenous contrast. RADIATION DOSE REDUCTION: This exam was performed according to the departmental dose-optimization program which includes automated exposure control, adjustment of the mA and/or kV according to patient size and/or use of iterative reconstruction technique. COMPARISON:  CT head December 06, 2021 FINDINGS: Brain: Patchy and confluent areas  of decreased attenuation are noted throughout the deep and periventricular white matter of the cerebral hemispheres bilaterally, compatible with chronic microvascular ischemic disease. No evidence of large-territorial  acute infarction. Interval increase in parenchymal hemorrhage now involving the midbrain and pons. Interval extension into the ventricular system with almost complete opacification of the third and fourth ventricles. Some blood products noted within the frontal horns of the lateral ventricles. Associated new hydrocephalus with associated vasogenic edema of the surrounding white matter. No midline shift. No hydrocephalus. Interval partial effacement of the cerebellar fissures. Basilar cisterns are effaced with concern of downward herniation. Vascular: No hyperdense vessel. Atherosclerotic calcifications are present within the cavernous internal carotid arteries. Skull: No acute fracture or focal lesion. Sinuses/Orbits: Paranasal sinuses and mastoid air cells are clear. The orbits are unremarkable. Other: None. IMPRESSION: Marked interval worsening of a pontine and mid brain parenchymal hemorrhage involving the majority of the brainstem and now extending into the ventricular system leading to acute hydrocephalus. These results were called by telephone at the time of interpretation on 11/30/2021 at 11:31 pm to provider Dr. Milas Gain, who verbally acknowledged these results. Electronically Signed   By: Iven Finn M.D.   On: 12/14/2021 23:38   CT Head Wo Contrast  Result Date: 12/14/2021 CLINICAL DATA:  Headache, new or worsening, neuro deficit (Age 56-49y) neuro change EXAM: CT HEAD WITHOUT CONTRAST TECHNIQUE: Contiguous axial images were obtained from the base of the skull through the vertex without intravenous contrast. RADIATION DOSE REDUCTION: This exam was performed according to the departmental dose-optimization program which includes automated exposure control, adjustment of the mA and/or kV according to patient size and/or use of iterative reconstruction technique. COMPARISON:  Same day CT FINDINGS: Brain: Acute 7 x 11 x 10 mm intraparenchymal hemorrhage in the left aspect of the pons/midbrain. No evidence of  acute large vascular territory infarct. Patchy white matter hypoattenuation, nonspecific but compatible with chronic microvascular disease. No hydrocephalus. Similar atrophy with ex vacuo ventricular dilation. No midline shift. Vascular: Retained vasculature recent CTA. Please see that study for evaluation of vasculature. Skull: No acute fracture. Sinuses/Orbits: Clear sinuses.  Unremarkable orbits. Other: No mastoid effusions. IMPRESSION: Acute 11 mm intraparenchymal hemorrhage in the left aspect of the pons/midbrain. Findings discussed with Dr. Theda Sers via telephone at 3:58 p.m. Electronically Signed   By: Margaretha Sheffield M.D.   On: 12/14/2021 16:00   CT Hip Left Wo Contrast  Result Date: 11/24/2021 CLINICAL DATA:  Hip trauma EXAM: CT OF THE LEFT HIP WITHOUT CONTRAST TECHNIQUE: Multidetector CT imaging of the left hip was performed according to the standard protocol. Multiplanar CT image reconstructions were also generated. RADIATION DOSE REDUCTION: This exam was performed according to the departmental dose-optimization program which includes automated exposure control, adjustment of the mA and/or kV according to patient size and/or use of iterative reconstruction technique. COMPARISON:  None. FINDINGS: Bones/Joint/Cartilage No acute fracture. There is mild osteoarthritic degenerative change at the left hip. Ligaments Suboptimally assessed by CT. Muscles and Tendons No acute abnormality Soft tissues Unremarkable IMPRESSION: No acute fracture of the left hip. Electronically Signed   By: Ulyses Jarred M.D.   On: 11/24/2021 21:33   DG CHEST PORT 1 VIEW  Result Date: 12/01/2021 CLINICAL DATA:  Fever EXAM: PORTABLE CHEST 1 VIEW COMPARISON:  12/16/2021 FINDINGS: Cardiac size is within normal limits. There is ectasia of thoracic aorta. Tip of endotracheal tube is approximately 5 cm above the carina. Enteric tube is noted traversing the esophagus. Small linear densities seen in the lower  lung fields. There are no  signs of pulmonary edema. There is no significant pleural effusion or pneumothorax. IMPRESSION: Small linear densities in the lower lung fields suggest subsegmental atelectasis. Electronically Signed   By: Elmer Picker M.D.   On: 12/01/2021 11:21   DG Chest Port 1 View  Result Date: 12/12/2021 CLINICAL DATA:  Intubation at bedside. EXAM: PORTABLE CHEST 1 VIEW COMPARISON:  Chest radiograph dated June 27, 2021 FINDINGS: The heart is enlarged. Aorta is ectatic. Low lung volumes. Bibasilar atelectasis. Endotracheal tube with distal tip approximately 2.5 cm above the carina. Feeding tube coursing below the diaphragm with distal tip not included. No acute osseous abnormality. IMPRESSION: 1. Cardiomegaly. 2. Low lung volumes. Bibasilar atelectasis. No large pleural effusion or pneumothorax. 3. Endotracheal tube with distal tip approximately 2.5 cm above the carina. Feeding tube coursing below the diaphragm with distal tip not included. Electronically Signed   By: Keane Police D.O.   On: 12/19/2021 17:56   ECHOCARDIOGRAM COMPLETE  Result Date: 11/28/2021    ECHOCARDIOGRAM REPORT   Patient Name:   Michelle Graves Date of Exam: 11/28/2021 Medical Rec #:  [id number]         Height:       67.0 in Accession #:    [id number]        Weight:       200.6 lb Date of Birth:  October 18, 1951          BSA:          2.025 m Patient Age:    71 years          BP:           147/92 mmHg Patient Gender: F                 HR:           78 bpm. Exam Location:  Inpatient Procedure: 2D Echo, Cardiac Doppler and Color Doppler Indications:    Stroke  History:        Patient has prior history of Echocardiogram examinations, most                 recent 12/16/2020. Abnormal ECG, Stroke and TIA,                 Arrythmias:Tachycardia, Signs/Symptoms:Hypotension and Altered                 Mental Status; Risk Factors:Hypertension.  Sonographer:    Roseanna Rainbow RDCS Referring Phys: [id number] [place]  Sonographer Comments: Technically  difficult study due to poor echo windows, Technically challenging study due to limited acoustic windows, suboptimal parasternal window, suboptimal apical window and echo performed with patient supine and on artificial respirator. Image acquisition challenging due to patient body habitus and Image acquisition challenging due to respiratory motion. Extremely difficult images. Supine on vent. IMPRESSIONS  1. Small LV cavity with near cavity obliteration during systole. Peak gradient through the LV is 30 mm Hg. Left ventricular ejection fraction, by estimation, is 70 to 75%. The left ventricle has hyperdynamic function.  2. Right ventricular systolic function is normal. The right ventricular size is normal. Comparison(s): The left ventricular function is unchanged. FINDINGS  Left Ventricle: Small LV cavity with near cavity obliteration during systole. Peak gradient through the LV is 30 mm Hg. Left ventricular ejection fraction, by estimation, is 70 to 75%. The left ventricle has hyperdynamic function. The left ventricular internal cavity size was small. Left ventricular diastolic parameters are  consistent with Grade I diastolic dysfunction (impaired relaxation). Right Ventricle: The right ventricular size is normal. Right vetricular wall thickness was not assessed. Right ventricular systolic function is normal. Left Atrium: Left atrial size was normal in size. Pericardium: There is no evidence of pericardial effusion. Tricuspid Valve: The tricuspid valve is grossly normal. Tricuspid valve regurgitation is not demonstrated. Aortic Valve: The aortic valve was not well visualized. Aortic valve regurgitation is trivial. Aortic valve sclerosis is present, with no evidence of aortic valve stenosis. Pulmonic Valve: The pulmonic valve was not well visualized. Pulmonic valve regurgitation is not visualized. Aorta: The aortic root is normal in size and structure. IAS/Shunts: The interatrial septum was not assessed.  LEFT  VENTRICLE PLAX 2D LVOT diam:     2.30 cm     Diastology LV SV:         76          LV e' medial:    3.24 cm/s LV SV Index:   37          LV E/e' medial:  15.7 LVOT Area:     4.15 cm    LV e' lateral:   5.11 cm/s                            LV E/e' lateral: 10.0  LV Volumes (MOD) LV vol d, MOD A2C: 24.1 ml LV vol d, MOD A4C: 41.5 ml LV vol s, MOD A2C: 5.3 ml LV vol s, MOD A4C: 12.6 ml LV SV MOD A2C:     18.8 ml LV SV MOD A4C:     41.5 ml LV SV MOD BP:      24.6 ml RIGHT VENTRICLE            IVC RV S prime:     9.24 cm/s  IVC diam: 1.50 cm TAPSE (M-mode): 1.3 cm LEFT ATRIUM           Index        RIGHT ATRIUM          Index LA Vol (A2C): 12.2 ml 6.02 ml/m   RA Area:     7.08 cm LA Vol (A4C): 33.5 ml 16.54 ml/m  RA Volume:   12.10 ml 5.98 ml/m  AORTIC VALVE LVOT Vmax:   115.00 cm/s LVOT Vmean:  72.200 cm/s LVOT VTI:    0.182 m MITRAL VALVE MV Area (PHT): 4.21 cm    SHUNTS MV Decel Time: 180 msec    Systemic VTI:  0.18 m MV E velocity: 50.90 cm/s  Systemic Diam: 2.30 cm MV A velocity: 87.50 cm/s MV E/A ratio:  0.58 Dorris Carnes MD Electronically signed by Dorris Carnes MD Signature Date/Time: 11/28/2021/11:46:29 AM    Final    DG Hip Unilat W or Wo Pelvis 2-3 Views Left  Result Date: 11/24/2021 CLINICAL DATA:  fall EXAM: DG HIP (WITH OR WITHOUT PELVIS) 2-3V LEFT; LEFT FEMUR 2 VIEWS COMPARISON:  October 22, 2021 FINDINGS: Question a subtle linear lucency along the LEFT greater trochanter in the region of the LEFT femoral neck. Enthesopathic changes of the greater and lesser trochanter. No additional possible fractures noted. Limited assessment of the sacrum secondary to overlapping bowel contents. Mild degenerative changes of the LEFT hip. IMPRESSION: Linear lucency of the LEFT femoral neck along the LEFT greater trochanter could reflect a nondisplaced fracture in the appropriate clinical setting. Consider further evaluation with dedicated cross-sectional imaging if clinical concern. Electronically Signed  By:  Valentino Saxon M.D.   On: 11/24/2021 20:31   DG Femur Min 2 Views Left  Result Date: 11/24/2021 CLINICAL DATA:  fall EXAM: DG HIP (WITH OR WITHOUT PELVIS) 2-3V LEFT; LEFT FEMUR 2 VIEWS COMPARISON:  October 22, 2021 FINDINGS: Question a subtle linear lucency along the LEFT greater trochanter in the region of the LEFT femoral neck. Enthesopathic changes of the greater and lesser trochanter. No additional possible fractures noted. Limited assessment of the sacrum secondary to overlapping bowel contents. Mild degenerative changes of the LEFT hip. IMPRESSION: Linear lucency of the LEFT femoral neck along the LEFT greater trochanter could reflect a nondisplaced fracture in the appropriate clinical setting. Consider further evaluation with dedicated cross-sectional imaging if clinical concern. Electronically Signed   By: Valentino Saxon M.D.   On: 11/24/2021 20:31   CT HEAD CODE STROKE WO CONTRAST  Result Date: 12/15/2021 CLINICAL DATA:  Code stroke.  Neuro deficit, acute, stroke suspected EXAM: CT HEAD WITHOUT CONTRAST TECHNIQUE: Contiguous axial images were obtained from the base of the skull through the vertex without intravenous contrast. RADIATION DOSE REDUCTION: This exam was performed according to the departmental dose-optimization program which includes automated exposure control, adjustment of the mA and/or kV according to patient size and/or use of iterative reconstruction technique. COMPARISON:  06/27/2021 FINDINGS: Brain: There is no acute intracranial hemorrhage, mass effect, or edema. No new loss of gray-white differentiation. Confluent areas of low-density in the supratentorial white matter are nonspecific but probably reflect similar moderate to marked chronic microvascular ischemic changes. Prominence of the ventricles and sulci reflects similar parenchymal volume loss. No extra-axial collection. Vascular: No hyperdense vessel. Intracranial atherosclerotic calcification at the skull base.  Skull: Unremarkable. Sinuses/Orbits: No acute abnormality. Other: Mastoid air cells are clear. ASPECTS ([place] Stroke Program Early CT Score) - Ganglionic level infarction (caudate, lentiform nuclei, internal capsule, insula, M1-M3 cortex): 7 - Supraganglionic infarction (M4-M6 cortex): 3 Total score (0-10 with 10 being normal): 10 IMPRESSION: There is no acute intracranial hemorrhage or evidence of acute infarction. ASPECT score is 10. These results were communicated to Dr. Theda Sers at 2:32 pm on 12/19/2021 by text page via the [facility] messaging system. Electronically Signed   By: Macy Mis M.D.   On: 12/07/2021 14:34   CT ANGIO HEAD NECK W WO CM (CODE STROKE)  Result Date: 12/21/2021 CLINICAL DATA:  Neuro deficit, acute, stroke suspected. Acute aphasia. Altered mental status. Stroke. EXAM: CT ANGIOGRAPHY HEAD AND NECK TECHNIQUE: Multidetector CT imaging of the head and neck was performed using the standard protocol during bolus administration of intravenous contrast. Multiplanar CT image reconstructions and MIPs were obtained to evaluate the vascular anatomy. Carotid stenosis measurements (when applicable) are obtained utilizing NASCET criteria, using the distal internal carotid diameter as the denominator. RADIATION DOSE REDUCTION: This exam was performed according to the departmental dose-optimization program which includes automated exposure control, adjustment of the mA and/or kV according to patient size and/or use of iterative reconstruction technique. CONTRAST:  33mL OMNIPAQUE IOHEXOL 350 MG/ML SOLN COMPARISON:  Same day head CT 12/20/2021. MRI brain 03/20/2021. MRA head 03/27/2021 (images available, report unavailable). CT angiogram head/neck 03/19/2021. Thyroid ultrasounds 03/04/2021 and 03/08/2021. FINDINGS: CTA NECK FINDINGS Aortic arch: Common origin of the innominate and left common carotid arteries. Atherosclerotic plaque within the visualized aortic arch and proximal major branch vessels of the  neck. No hemodynamically significant innominate or proximal subclavian artery stenosis. Right carotid system: CCA and ICA patent within the neck without stenosis. Mild atherosclerotic plaque about the  carotid bifurcation and within the proximal ICA. Partially retropharyngeal course of the cervical ICA. Left carotid system: CCA and ICA patent within the neck without stenosis. Minimal atherosclerotic plaque within the proximal ICA. Vertebral arteries: Vertebral arteries patent within the neck without stenosis. The left vertebral artery is dominant. Mild nonstenotic calcified plaque at the origin of the right vertebral artery. Skeleton: Reversal of the expected cervical lordosis. Cervical spondylosis. No acute bony abnormality or aggressive osseous lesion. Other neck: Heterogeneous and multinodular thyroid gland, previously evaluated by thyroid ultrasound on 03/04/2021. Additionally, there has been previous FNA of a and nodule within the isthmus. No cervical lymphadenopathy. Upper chest: No consolidation within the imaged lung apices. Biapical parenchymal scarring. Review of the MIP images confirms the above findings CTA HEAD FINDINGS Anterior circulation: The intracranial internal carotid arteries are patent. Calcified plaque within both vessels with no more than mild stenosis. The M1 middle cerebral arteries are patent. No M2 proximal branch occlusion or high-grade proximal stenosis is identified. The anterior cerebral arteries are patent. Unchanged 2 mm superiorly projecting vascular protrusion at the right ophthalmic artery origin (best appreciated on series 10, image 109) (series 8, image 104). Unchanged 1-2 mm inferiorly projecting vascular protrusion arising from the supraclinoid left ICA likely reflecting an aneurysm (series 10, image 124). Posterior circulation: The intracranial vertebral arteries are patent. The basilar artery is patent. The posterior cerebral arteries are patent. Both posterior cerebral  arteries demonstrate distal branch atherosclerotic irregularity. Posterior communicating arteries are diminutive or absent bilaterally. Venous sinuses: Within the limitations of contrast timing, no convincing thrombus. Anatomic variants: As described. Review of the MIP images confirms the above findings No emergent large vessel occlusion identified. These results were communicated to Dr. Theda Sers at 3:43 pmon 02/28/2023by text page via the [facility] messaging system. IMPRESSION: CTA neck: 1. The common carotid, internal carotid and vertebral arteries are patent within the neck without stenosis. 2.  Aortic Atherosclerosis (ICD10-I70.0). CTA head: 1. No intracranial large vessel occlusion or proximal high-grade arterial stenosis identified. 2. 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 3. 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 Electronically Signed   By: Kellie Simmering D.O.   On: 12/20/2021 15:44   CT US GUIDE VASC ACCESS LT NO REPORT  Result Date: 11/27/2021 There is no Radiologist interpretation  for this exam.    Time spent: 30 minutes  Rosalin Hawking, MD PhD Stroke Neurology 12/11/2021 3:57 PM

## 2021-12-25 NOTE — Progress Notes (Signed)
°   12-12-2021 1455  Clinical Encounter Type  Visited With Patient and family together  Visit Type Initial;Spiritual support  Referral From Nurse  Consult/Referral To None   Chaplain responded to spiritual consult request. Patient was not able to communicate but family believed she could hear [place]. Family shared with me memories they hold close about the patient  Family minister prayed with them earlier and they asked that I keep them in prayer in this last hours. I informed the family that if they wished for a chaplain to return to let the nurse know.    Valerie Roys Chaplain Resident  [facility]  [phone]

## 2021-12-25 NOTE — Progress Notes (Signed)
NAME:  Michelle Graves, MRN:  [id number], DOB:  Jun 17, 1951, LOS: 6 ADMISSION DATE:  12/22/2021, CONSULTATION DATE:  12/06/2021 REFERRING MD:  Dr. Theda Sers, CHIEF COMPLAINT:  Aphasia    History of Present Illness:  HPI obtained from medical chart review as patient is unable to provide due to altered mental status.   71 year old female with PMH as below who presented to ER with complaints of altered mental status.  EMS was called after patient was not answering door for physical therapy.  She was seen two days ago for hip pain and started on oxycodone and trazadone for sleep.  She was alert and oriented x1 on EMS arrival, LSW 1/31 around 0900, but improved by arrival to triage and was alert and oriented x 4.  While in triage, she then became severely aphasic, and noted to have right sided weakness.  A code stroke was activated and taken for [facility]  which was negative for acute ICH or acute infarction.   She was given TNK full dose at 1452.    She then was noted to have declining mental status.  Taken for repeat [facility] which showed an 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  She was reversed with TXA and started on cryo.  She was transferred to the ICU, where she was unable to protect her airway and emergently intubated on arrival.    Pertinent  Medical History  RA, CKD3b, anemia, HTN, CVA, diverticulitis, HLD, ascending aortic aneurysm   Significant Hospital Events: Including procedures, antibiotic start and stop dates in addition to other pertinent events   2/1 presented with aphasia and right sided weakness.  CTH neg.  S/p TNK complicated by IPH s/p TXA and cryo and requiring intubation for airway protection.    12/23/2021 CTH:  There is no acute intracranial hemorrhage or evidence of acute infarction. ASPECT score is 10. CTA neck:  The common carotid, internal carotid and vertebral arteries are patent within the neck without stenosis.  Aortic Atherosclerosis.   CTA head:   No intracranial large vessel occlusion or proximal high-grade arterial stenosis identified.  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 Repeat CTH post TNK showed an 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  Interim History / Subjective:   No acute issues, awaiting family arrival before transition to comfort measures/compassionate extubation.  Objective   Blood pressure 137/87, pulse 84, temperature (!) 97.3 F (36.3 C), resp. rate 20, height 5\' 7"  (1.702 m), weight 91 kg, SpO2 100 %.    Vent Mode: PRVC FiO2 (%):  [40 %] 40 % Set Rate:  [20 bmp] 20 bmp Vt Set:  [490 mL] 490 mL PEEP:  [5 cmH20] 5 cmH20 Plateau Pressure:  [16 cmH20-19 cmH20] 16 cmH20   Intake/Output Summary (Last 24 hours) at 12-20-2021 0843 Last data filed at 2021-12-20 0800 Gross per 24 hour  Intake 2540.88 ml  Output 1595 ml  Net 945.88 ml   Filed Weights   11/29/2021 1403  Weight: 91 kg   Examination: General appearance: 71 y.o., female, intubated Eyes: pupils equal not reactive HENT: NCAT; MMM Lungs: mech breath sounds bl, not triggering vent CV: RRR, no murmur  Abdomen: Soft, non-tender; non-distended, BS  hypoactive Extremities: No peripheral edema, warm Skin: Normal turgor and texture; no rash Neuro: withdraws vs extensor response to noxious  stim BLE   Resolved Hospital Problem list    Assessment & Plan:    L MCA CVA s/p TNK, complicated by brainstem IPH, 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 Concern for downward herniation -s/p TXA, cryo P -neuro primary -poor prognosis has been d/w family -- DNR, no escalation of care. Anticipate likely transition to comfort care early  today  Endotracheal tube present due to acute respiratory insufficiency with poor airway protection due to stroke, requiring MV  -cont MV support, likely compassionate extubation when family arrives -VAP, pulm hygiene   GOC, DNR status -Neurology has discussed [place] with family 2/3 -- pt will be DNR with no escalation of care. Comfort measures today when all family has arrived.   Best Practice (right click and "Reselect all SmartList Selections" daily)   Diet/type: NPO DVT prophylaxis: SCD GI prophylaxis: PPI Lines: N/A Foley:  N/A Code Status:  DNR  Last date of multidisciplinary goals of care discussion [2/6 led by neuro/stroke  -- GOC with family no escalation, DNR]  CRITICAL CARE N/a   [place]

## 2021-12-25 NOTE — Progress Notes (Signed)
Pt without pulse or resps, or any signs of life. Pronounced deceased by two Rns at 2015-02-09. Family at bedside.

## 2021-12-25 NOTE — Progress Notes (Signed)
Family has arrived and after confirmation they are ready to proceed with comfort care measures and terminal extubation.  Spoke with CCM JD Rollene Rotunda and received orders for comfort care.

## 2021-12-25 NOTE — Progress Notes (Signed)
RT NOTE: RT extubated patient to comfort care per order and family wishes. RN and family at bedside with patient.

## 2021-12-25 NOTE — Progress Notes (Addendum)
STROKE TEAM PROGRESS NOTE   INTERVAL HISTORY Patient is seen in her room with her grandson at the bedside. Neuro unchanged. Plan to transition to comfort care and terminally extubate today.   1142- extubated for comfort care   Vitals:   2021-12-30 0956 30-Dec-2021 1000 2021-12-30 1100 2021/12/30 1142  BP:  (!) 147/89 (!) 144/102   Pulse: 80 76 77   Resp: 16 20 20    Temp: (!) 96.8 F (36 C) 97.9 F (36.6 C) 98.2 F (36.8 C)   TempSrc:      SpO2: 100% 99% 100% 100%  Weight:      Height:       CBC:  Recent Labs  Lab 12/07/2021 1554 12/08/2021 1639 12/02/21 0444 2021/12/30 0046  WBC 7.1   < > 12.9* 10.2  NEUTROABS 3.7  --   --   --   HGB 10.3*   < > 9.2* 11.1*  HCT 32.3*   < > 28.7* 33.9*  MCV 98.2   < > 95.7 89.2  PLT 177   < > 157 248   < > = values in this interval not displayed.    Basic Metabolic Panel:  Recent Labs  Lab 12/19/2021 2019 11/28/21 0607 12/02/21 0444 12-30-2021 0451  NA  --    < > 154* 157*  K  --    < > 3.6 3.6  CL  --    < > 119* 120*  CO2  --    < > 20* 21*  GLUCOSE  --    < > 99 109*  BUN  --    < > 40* 43*  CREATININE  --    < > 2.25* 2.24*  CALCIUM  --    < > 7.7* 7.7*  MG 1.7  --   --   --    < > = values in this interval not displayed.    Lipid Panel:  Recent Labs  Lab 11/28/21 0435 12/01/21 0441  CHOL 111  --   TRIG 96   95 104  HDL 52  --   CHOLHDL 2.1  --   VLDL 19  --   LDLCALC 40  --     HgbA1c:  Recent Labs  Lab 11/28/21 0435  HGBA1C 6.1*    Urine Drug Screen: No results for input(s): LABOPIA, COCAINSCRNUR, LABBENZ, AMPHETMU, THCU, LABBARB in the last 168 hours.  Alcohol Level  Recent Labs  Lab 12/24/2021 1403  ETH <10     IMAGING past 24 hours No results found.  PHYSICAL EXAM General:  Intubated, well-nourished, well-developed middle-aged African American lady in no acute distress  Neurological: Patient is intubated.  Not sedated.  Comatose.  Eyes are closed, startle resonse on stimulation. Pupils right 66mm, left  4.81mm, nonreactive to light.  Corneal and oculocephalic reflexes absent.  Cough and gag reflex present.    Triple flexion in BLE to noxious stimuli present, flicker of muscle noted in left upper extremity and mild withdraw at BLEs, L>R.  No spontaneous motor movement seen.  ASSESSMENT/PLAN Ms. Michelle GOCHNOUR is a 71 y.o. female with history of CKD, anemia, arthritis, stroke and HTN presenting with confusion and aphasia.  TNK was administered, but unfortunately, patient developed an acute hemorrhage in the lower midbrain and upper pons.  Cryoprecipitate was given, but hemorrhage was noted to be enlarged on follow up CT.  Prognosis is poor, and discussion were had with patient's children regarding goals of care.  Family is coming from  [place] on Monday and further decisions will be made then.   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 ASPECTS 10.    CTA head & neck No LVO or hemodynamically significant stenosis.  63mm periophthalmic aneurysm and <77mm supraclinoid ICA aneurysm Repeat CT head acute 36mm hemorrhage in left aspect of pons/midbrain Second repeat CT head worsening of IPH now involving majority of brainstem and extending into ventricular system leading to acute hydrocephalus 2D Echo EF [id number], grade 1 diastolic dysfunction, interatrial septum not assessed LDL 40 HgbA1c 5.7 VTE prophylaxis - SCDs clopidogrel 75 mg daily prior to admission, now on No antithrombotic secondary to [place] Therapy recommendations:  pending Disposition:  pending, poor prognosis, family plan for comfort care tomorrow after all family arrives  Cerebral edema with acute hydrocephalus Acute hydrocephalus seen on follow up CT 3% HTS at 75cc/hr -> NS @ 50->1/2NS @ 100 Na 158->158-> 157->154  Fever  Leukocytosis  Tmax 103.6-> 101.3->100.6-> 97.5 WBC 7.0->18.2->12.9 CXR subsegmental atelectasis UA WBC > 50 On rocephin  AKI Cre  1.55->2.07->2.33->2.25-> 2.24 S/p fluid bolus continue IV fluid @ 100 BMP monitoring  Hypertension->hypotension->stable now Home meds:  coreg 25 mg daily Stable now Increased IV fluid Keep SBP less than 160 Long-term BP goal normotensive  Hyperlipidemia Home meds:  rosuvastatin 10 mg daily LDL 40, goal < 70 Continue statin at discharge  Respiratory failure Patient was intubated due to failure to protect airway Ventilator management per CCM Not breathing over the vent  Other Stroke Risk Factors Advanced Age >/= 28  Obesity, Body mass index is 31.42 kg/m., BMI >/= 30 associated with increased stroke risk, recommend weight loss, diet and exercise as appropriate  Hx of stroke  Other Active Problems Tachycardia  Hospital day # 6  Patient seen and examined by NP/APP with MD. MD to update note as needed.   Janine Ores, DNP, FNP-BC [facility] Pager: [phone]   ATTENDING NOTE: I reviewed above note and agree with the assessment and plan. Pt was seen and examined.   Pt still intubated on vent, neuro unchanged, still has gag reflexes and mild withdraw to pain and startle reflex on stimulation. Family ready for comfort care measure today when arrive. Will do terminal extubation and comfort care.   For detailed assessment and plan, please refer to above as I have made changes wherever appropriate.   Rosalin Hawking, MD PhD Stroke Neurology 2021/12/06 6:55 PM       To contact Stroke Continuity provider, please refer to [email or web address] After hours, contact General Neurology

## 2021-12-25 DEATH — deceased

## 2022-04-07 ENCOUNTER — Ambulatory Visit: Payer: Medicare Other | Admitting: Adult Health

## 2022-06-27 IMAGING — CT CT ABD-PELV W/O CM
2 of 4 series · 16 of 46 positions shown, 18 images · non-contrast
Comparison: Virtual colonoscopy 02/15/2020, single-contrast barium
enema 05/08/2020.

CLINICAL DATA: Diarrhea.  Generalized weakness.  Colonic stricture.

EXAM:
CT ABDOMEN AND PELVIS WITHOUT CONTRAST
TECHNIQUE: Multidetector CT imaging of the abdomen and pelvis was performed
following the standard protocol without IV contrast.

[Series 3: a/p w/o 5mm · axial · non-contrast · 0.95mm/px · z∈[+760,+1204]mm · 13 of 97 slices shown, 15 images]
[im 4/97  soft-tissue]
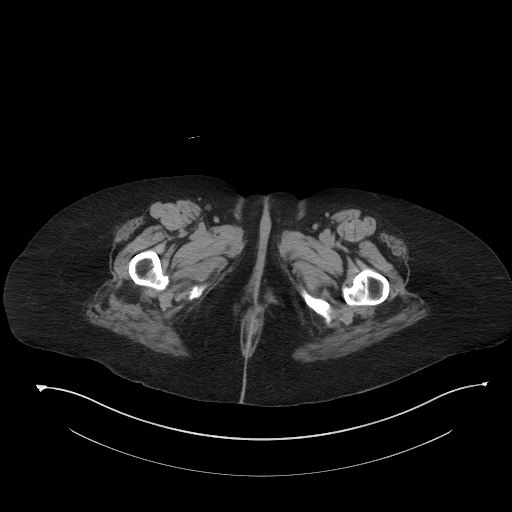
[im 4/97  bone]
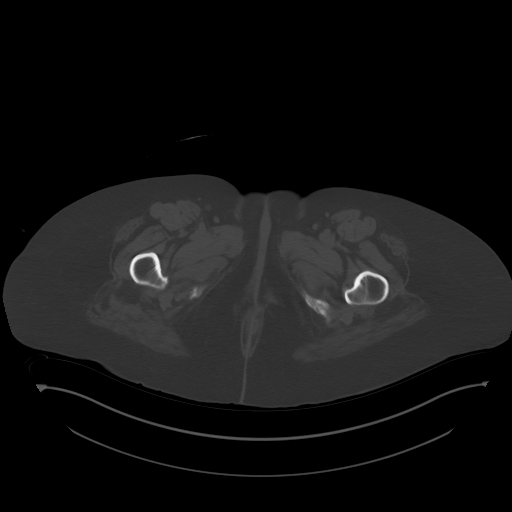
[im 12/97  soft-tissue]
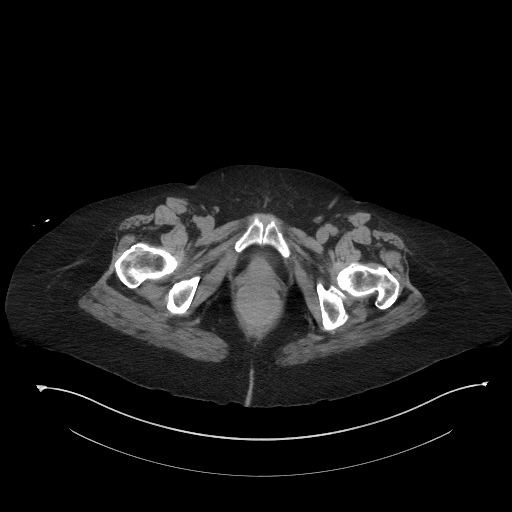
[im 19/97  soft-tissue]
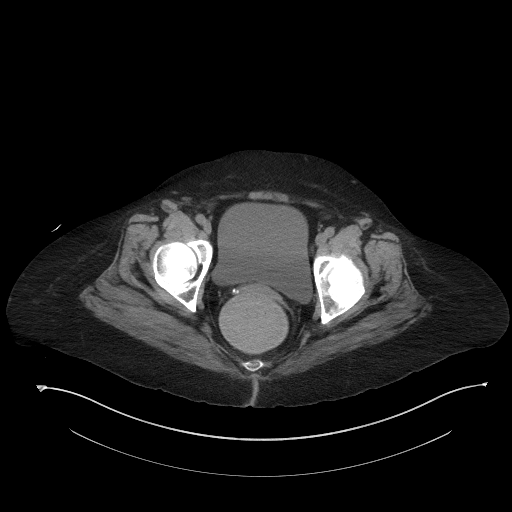
[im 26/97  soft-tissue]
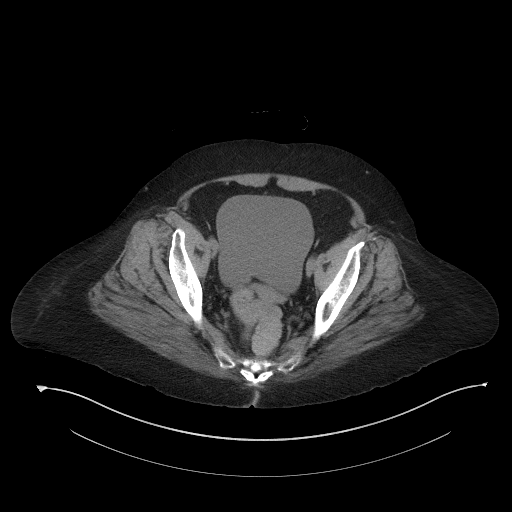
[im 34/97  soft-tissue]
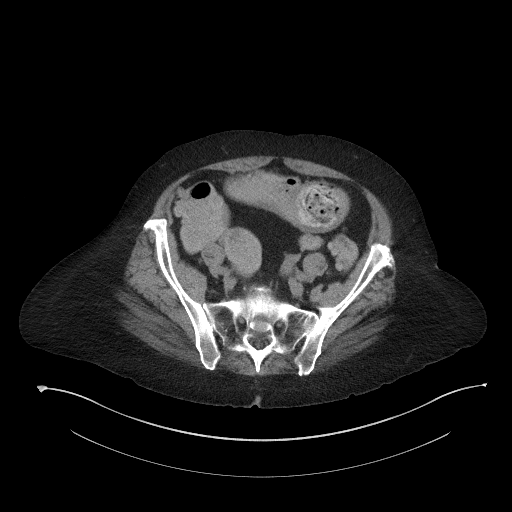
[im 41/97  soft-tissue]
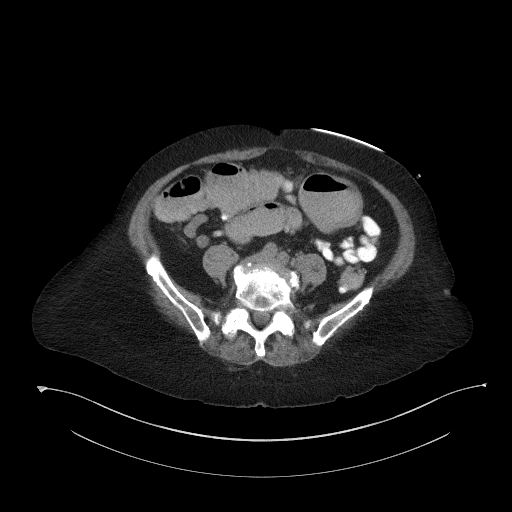
[im 49/97  soft-tissue]
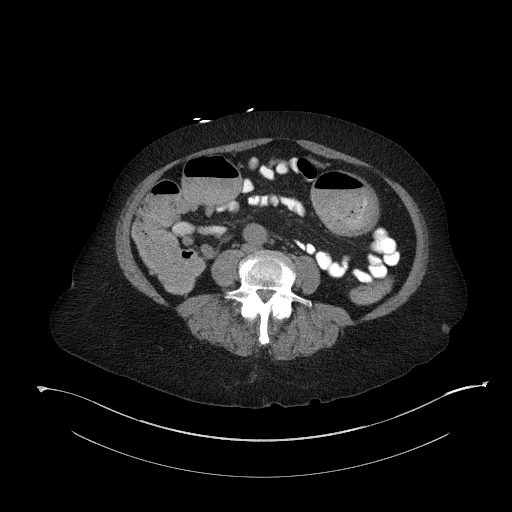
[im 56/97  soft-tissue]
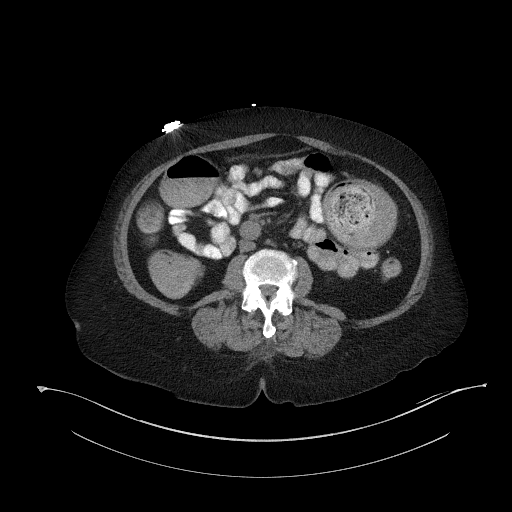
[im 63/97  soft-tissue]
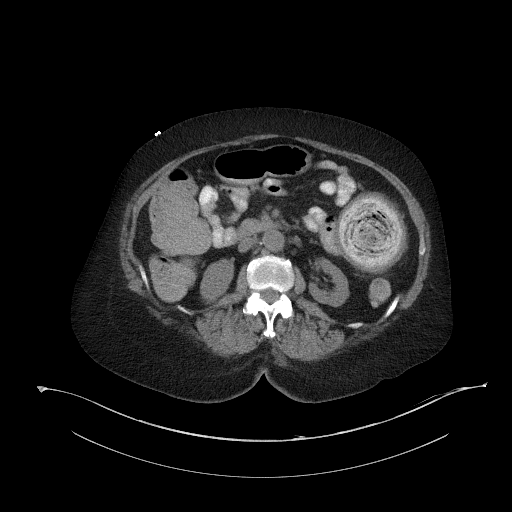
[im 63/97  bone]
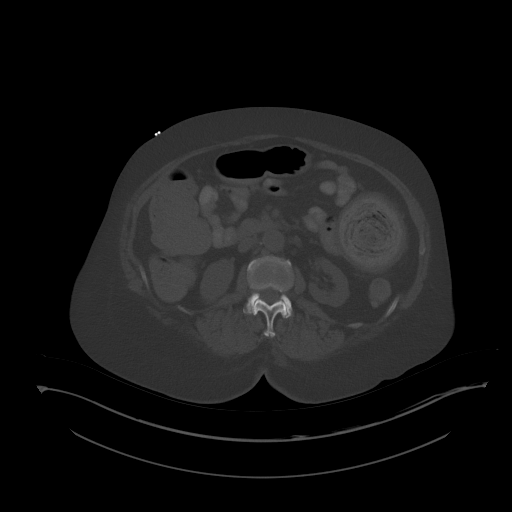
[im 71/97  soft-tissue]
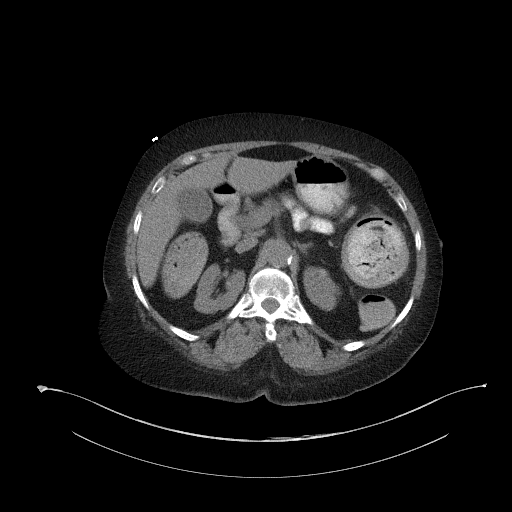
[im 78/97  soft-tissue]
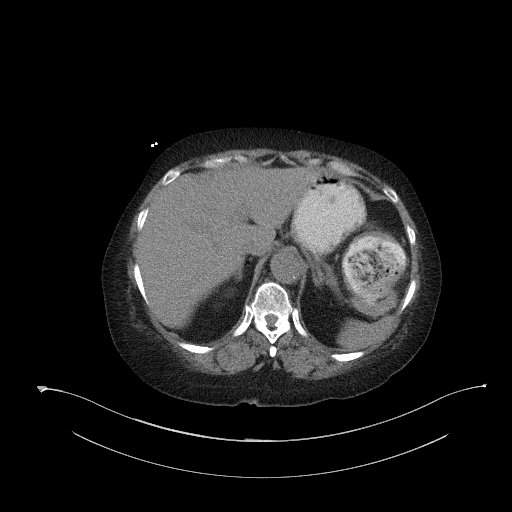
[im 85/97  soft-tissue]
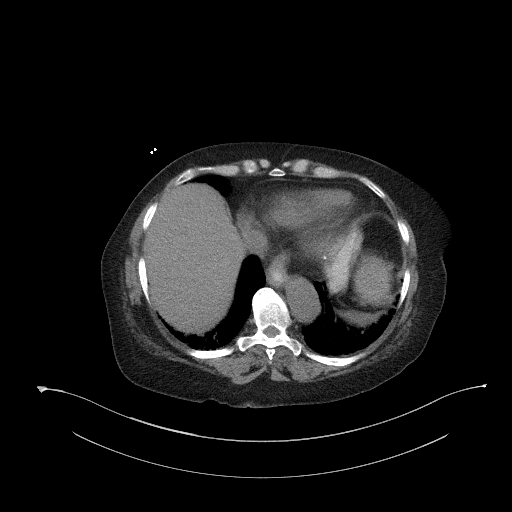
[im 93/97  soft-tissue]
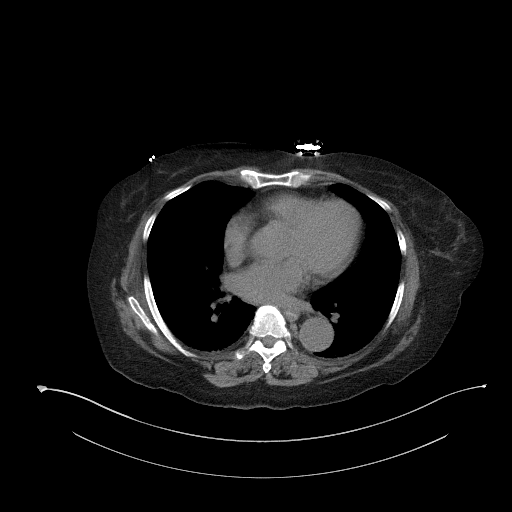

[Series 6: a/p w/o cor · coronal · non-contrast · 0.92mm/px · 3 of 144 slices shown]
[im 48/144  soft-tissue]
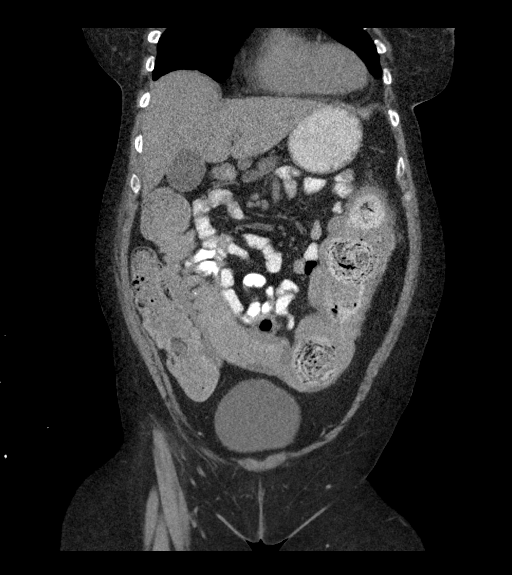
[im 64/144  soft-tissue]
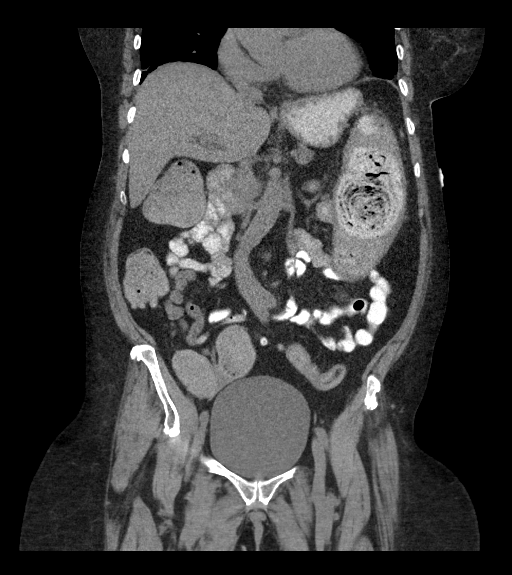
[im 80/144  soft-tissue]
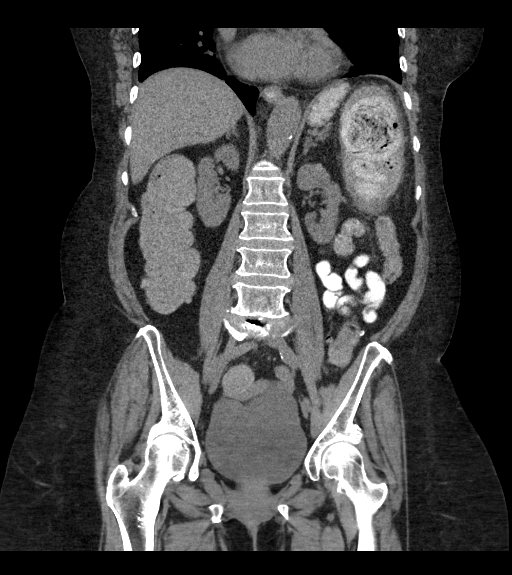

[16 of 46 positions shown; findings below may reference images not displayed]

FINDINGS: Lower chest: Mild scarring at the lung bases.

Hepatobiliary: No focal hepatic lesion on noncontrast exam.
Gallbladder normal

Pancreas: Pancreas is normal. No ductal dilatation. No pancreatic
inflammation.

Spleen: Normal spleen

Adrenals/urinary tract: Adrenal glands normal. Nephrolithiasis
ureterolithiasis. Bladder normal

Stomach/Bowel: Stomach, duodenum and small bowel normal. Terminal
ileum is normal. Appendix not identified.

There is fluid stool in the ascending colon. There is several large
stool balls in the distal transverse colon leading up the splenic
flexure. These have a chronic appearance concentric lamellations
(image 34/3). There is a caliber change at the splenic flexure
leading to collapsed descending colon. This caliber change present
on image [DATE] of axial imaging. A stricturing is well seen on image
150/series 7/sagittal imaging.

There several large stool ball again retained proximal to this
stricturing of the splenic flexure.

Several diverticula descending colon.  Fluid within the rectum.

Vascular/Lymphatic: Abdominal aorta is normal caliber with
atherosclerotic calcification. There is no retroperitoneal or
periportal lymphadenopathy. No pelvic lymphadenopathy.

Reproductive: Uterus and adnexa unremarkable.

Other: No free fluid or free air.

Musculoskeletal: No aggressive osseous lesion.
IMPRESSION: 1. Chronic stricturing of the transverse colon at the splenic
flexure. Three large chronic lamellated stool balls proximal to this
stricturing. Differential includes benign and malignant stricturing
of the colon.
2. No lymphadenopathy.

## 2022-07-09 IMAGING — US US RENAL
1 series · 14 of 25 positions shown · non-contrast
Comparison: None.

CLINICAL DATA: Renal failure.

EXAM:
RENAL / URINARY TRACT ULTRASOUND COMPLETE

[Series 1: us renal · 14 of 38 slices shown]
[im 1/38]
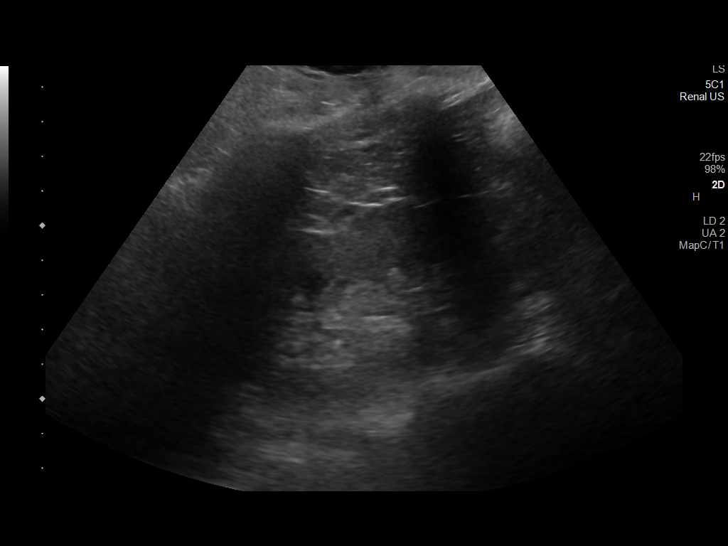
[im 4/38]
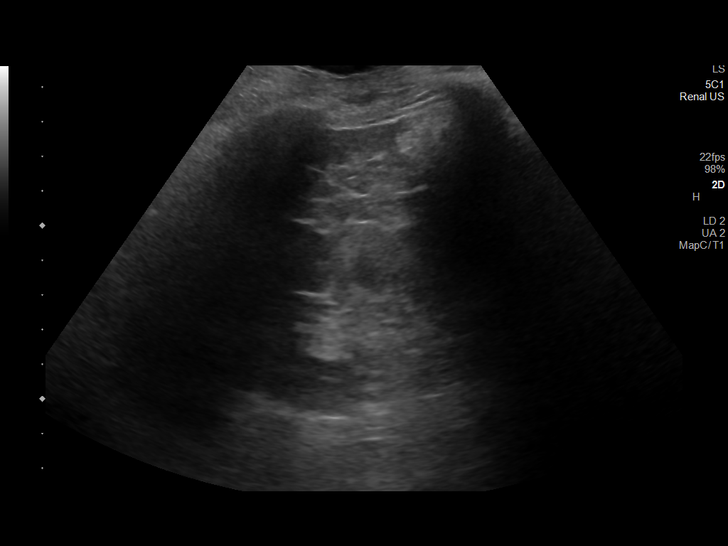
[im 7/38]
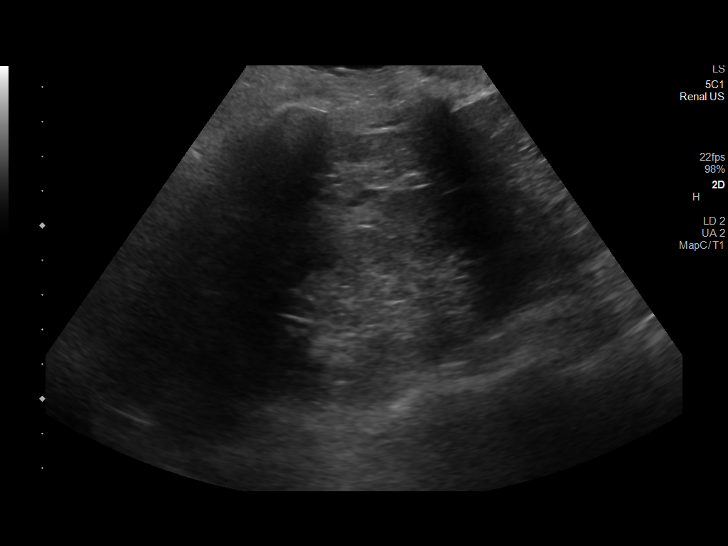
[im 10/38]
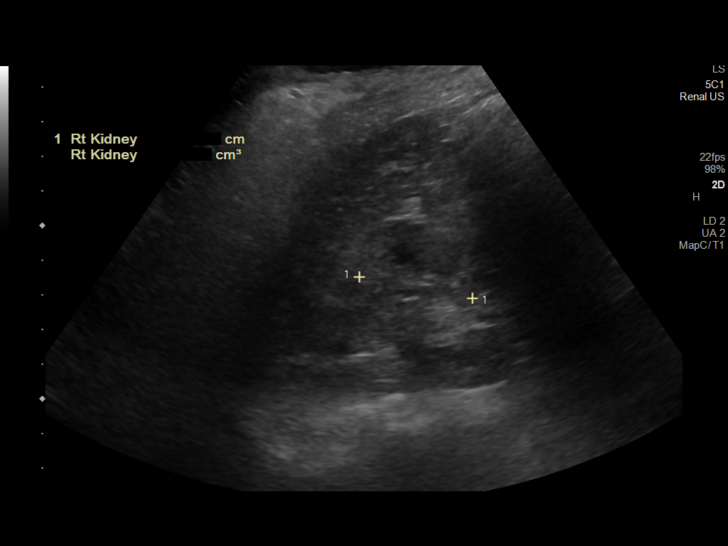
[im 13/38]
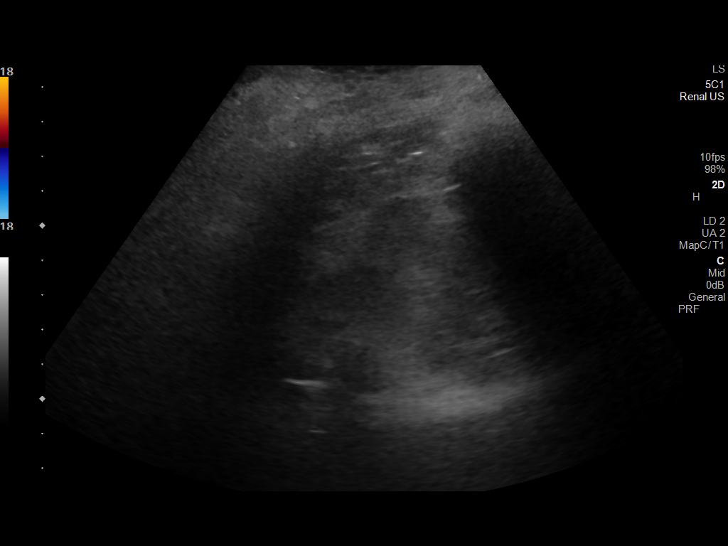
[im 14/38]
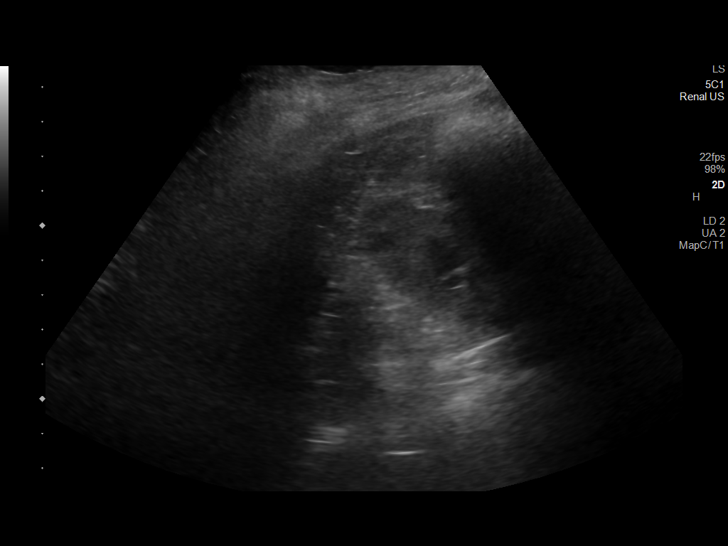
[im 17/38]
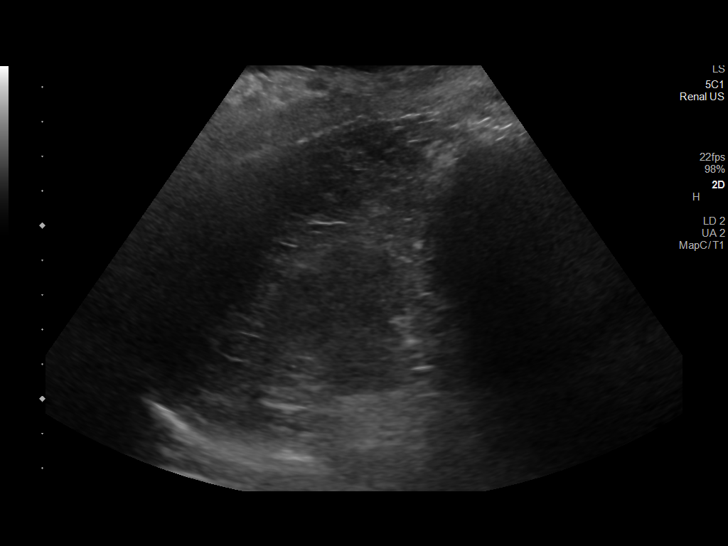
[im 21/38]
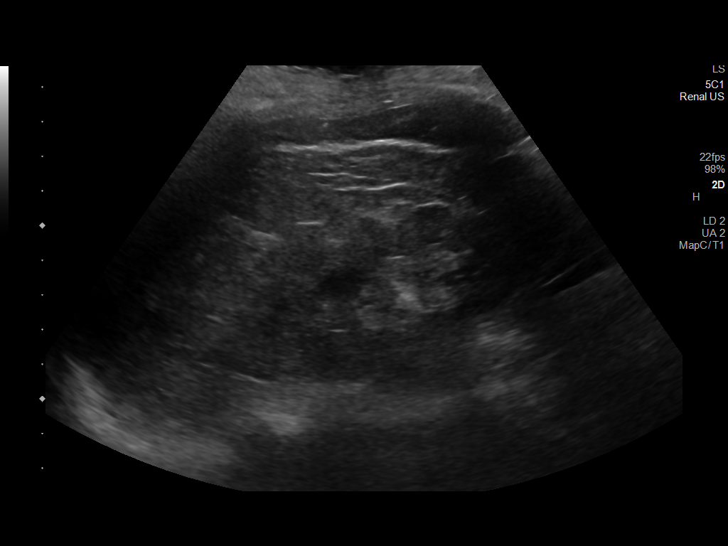
[im 24/38]
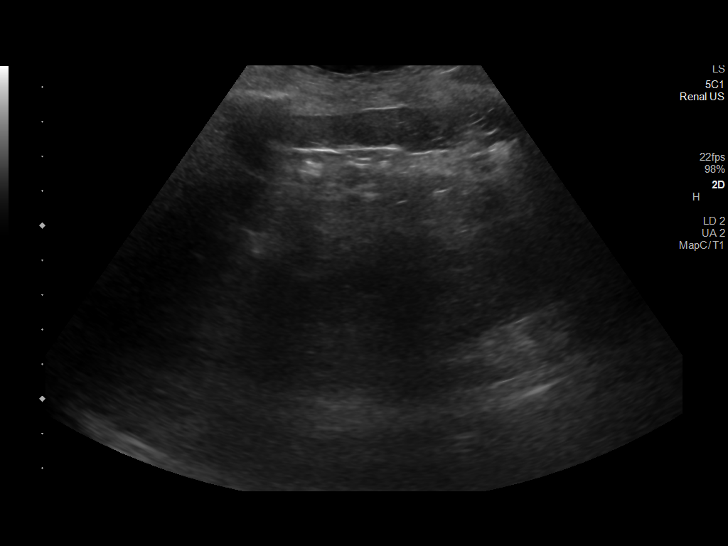
[im 25/38]
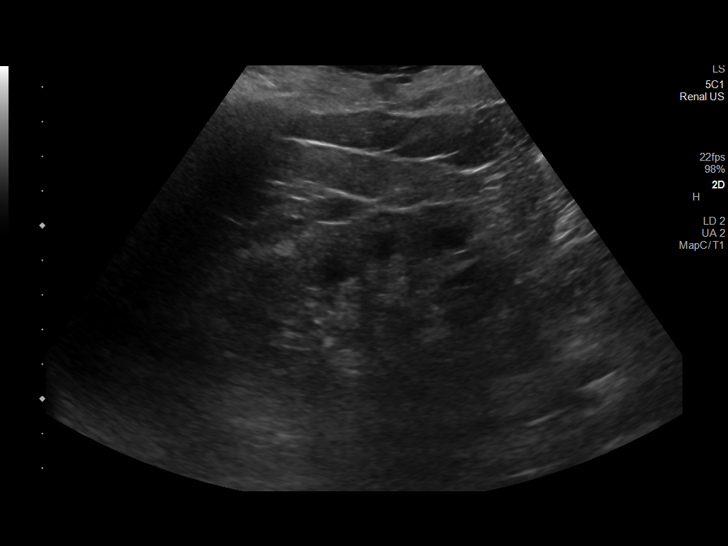
[im 28/38]
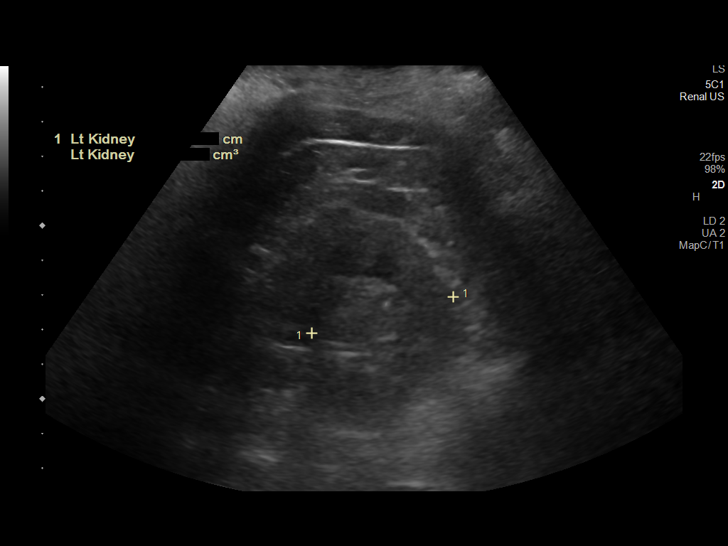
[im 31/38]
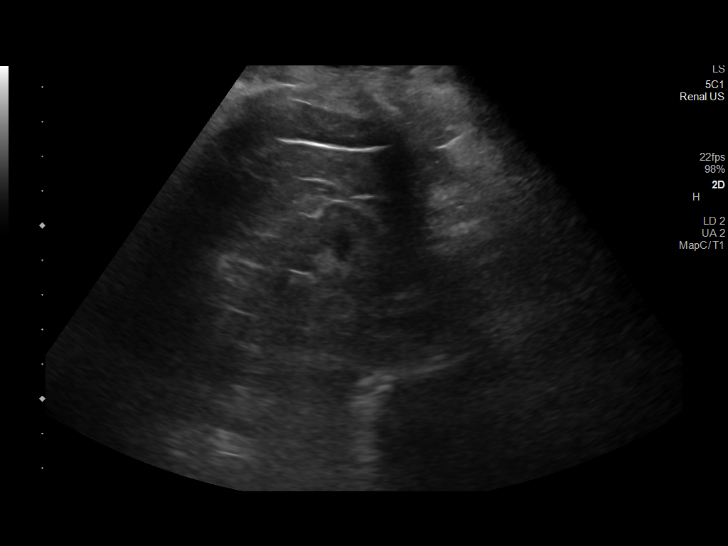
[im 34/38]
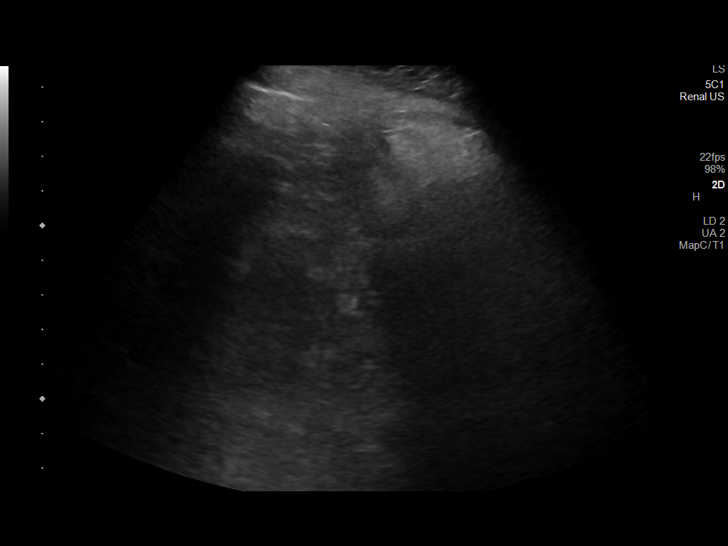
[im 38/38]
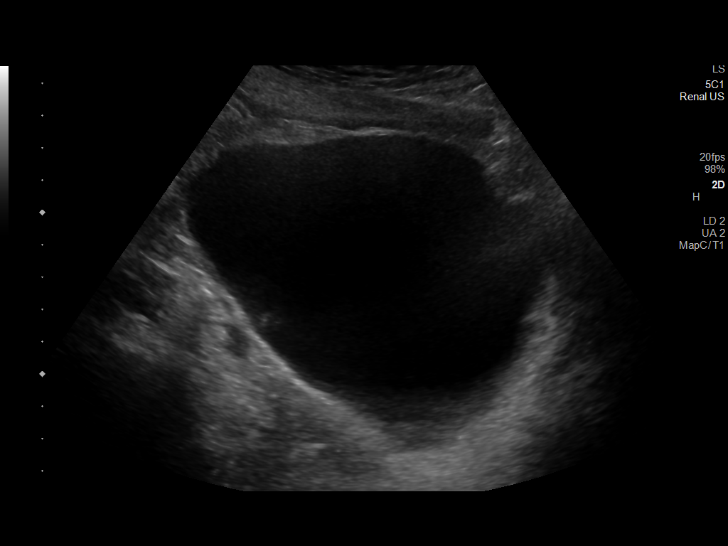

[14 of 25 positions shown; findings below may reference images not displayed]

FINDINGS: Right Kidney:

Renal measurements: 8.5 x 4.5 x 4.7 cm = volume: 92.77 mL. Increased
parenchymal echogenicity. Cortical thinning with some lobulation of
the contour. No mass. No stone or hydronephrosis.

Left Kidney:

Renal measurements: 8.2 x 4.5 x 4.2 cm = volume: 82.1 mL. Increased
parenchymal echogenicity. Renal cortical thinning. Lobulated
contour. No mass, stone or hydronephrosis.

Bladder:

Distended.  Otherwise unremarkable.

Other:

None.
IMPRESSION: 1. Findings consistent with medical renal disease with reduced renal
sizes, increased renal parenchymal echogenicity and renal cortical
thinning. No masses, stones or hydronephrosis.
2. Distended bladder.

## 2022-07-09 IMAGING — CR DG ABDOMEN ACUTE W/ 1V CHEST
3 series · 3 of 3 positions shown · non-contrast
Comparison: CT scan 11/26/2020

CLINICAL DATA: Constipation.

EXAM:
DG ABDOMEN ACUTE WITH 1 VIEW CHEST

[chest ap]
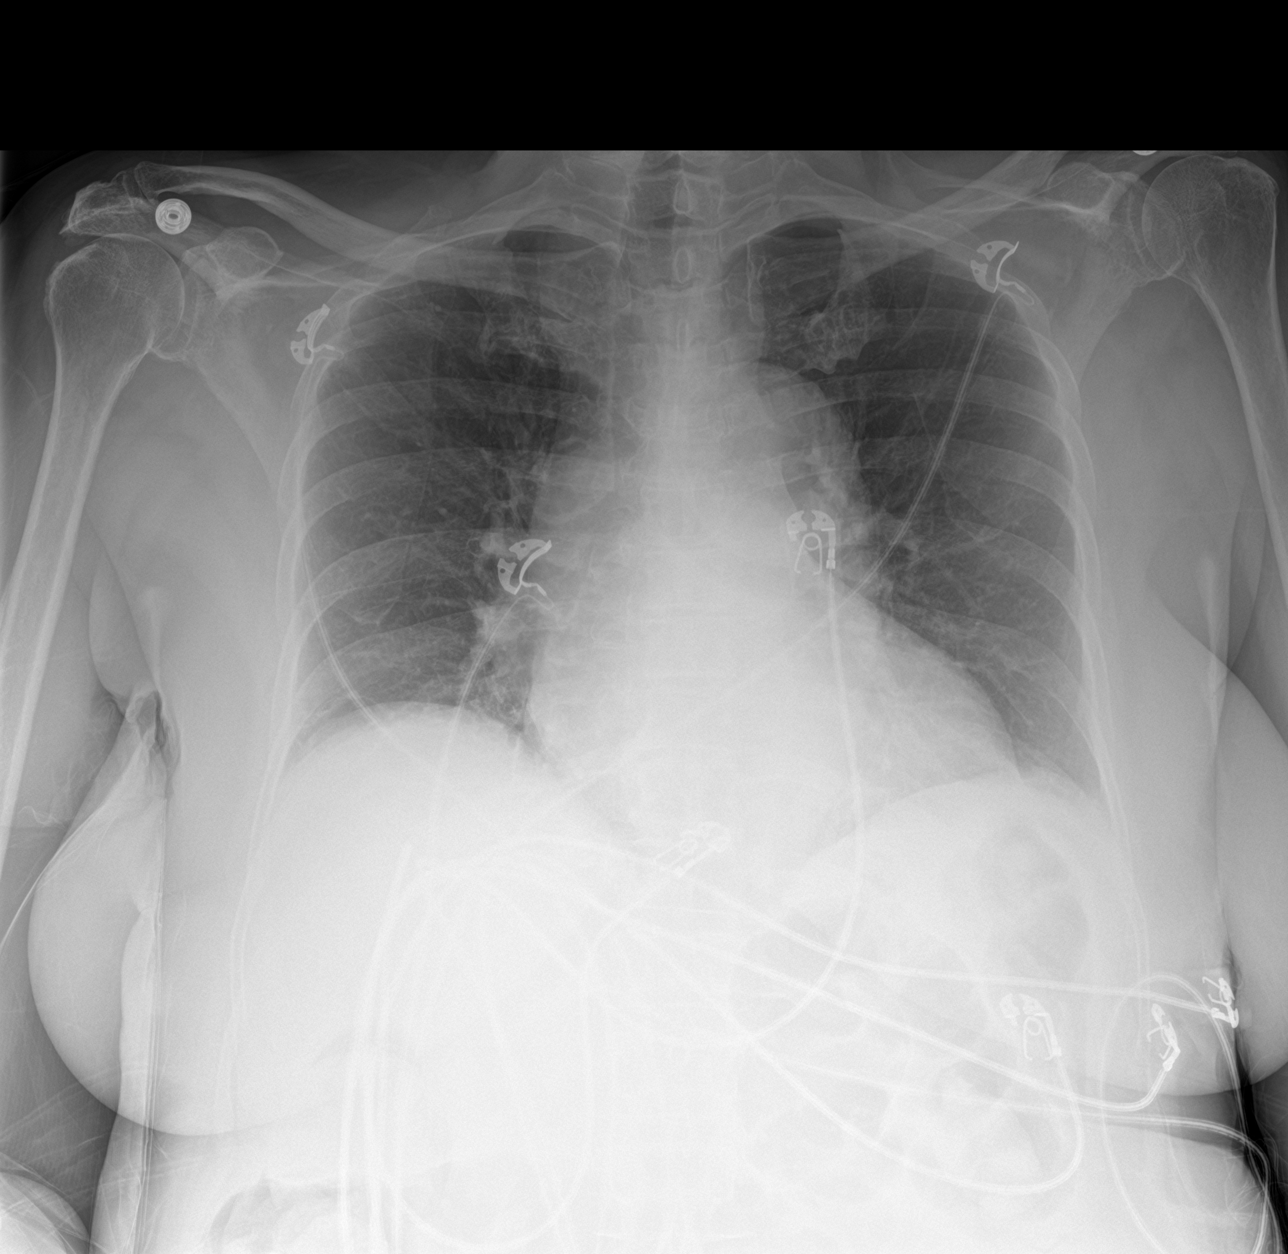

[abdomen erect]
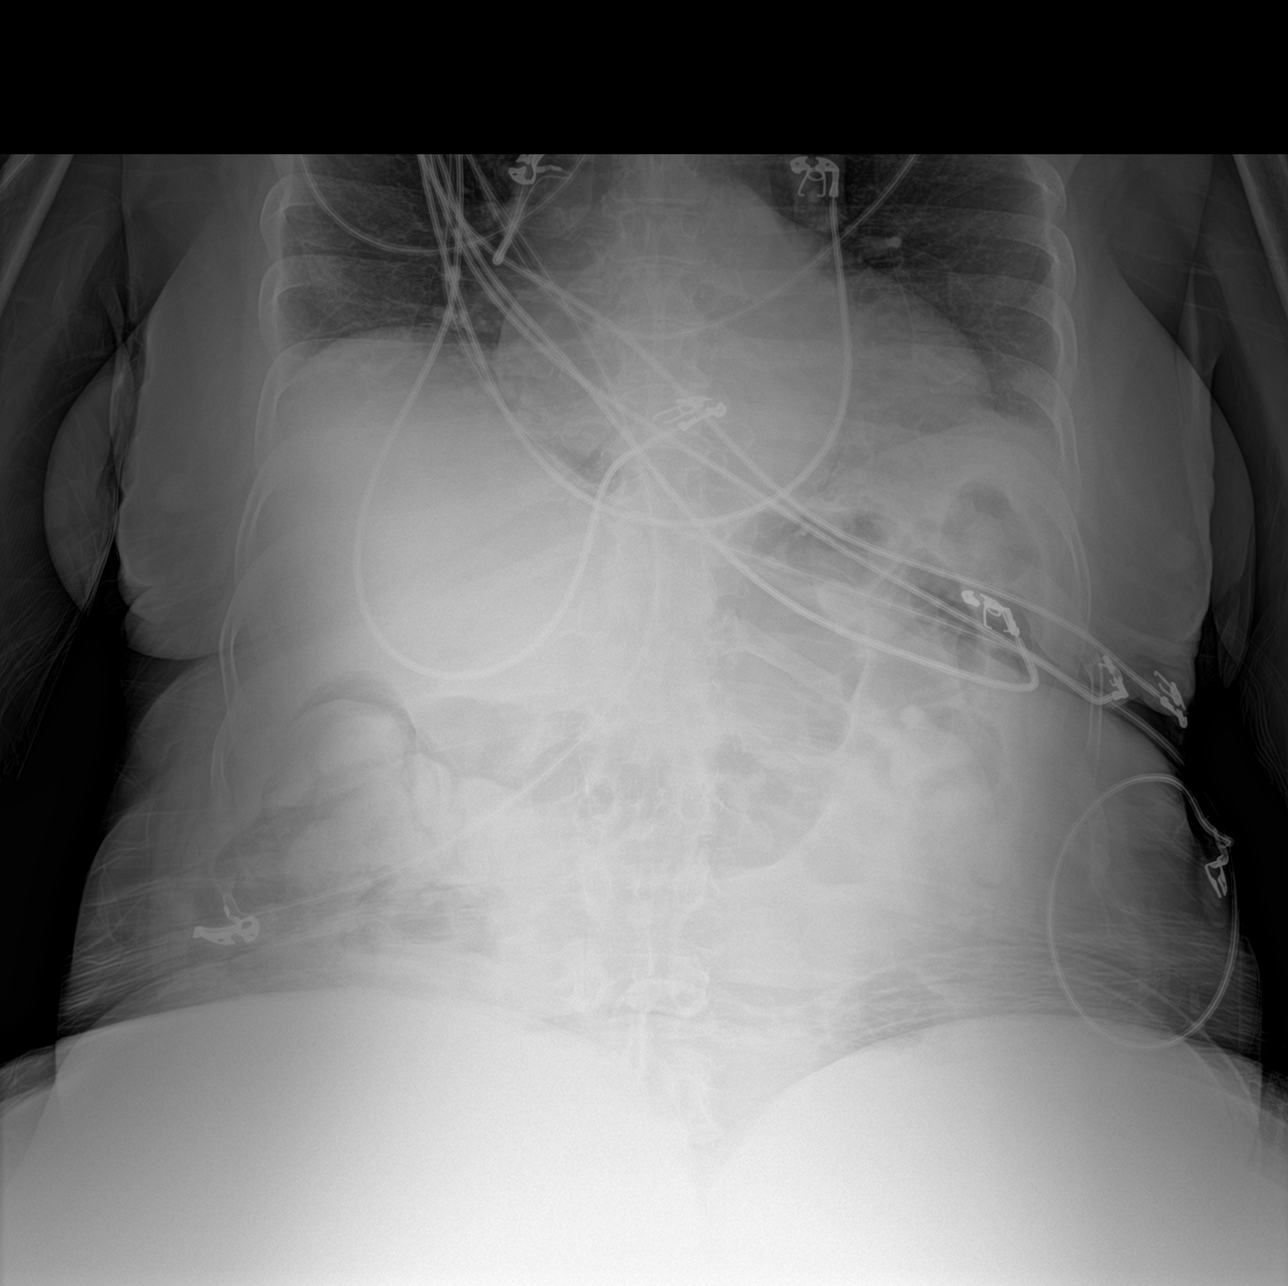

[abdomen supine]
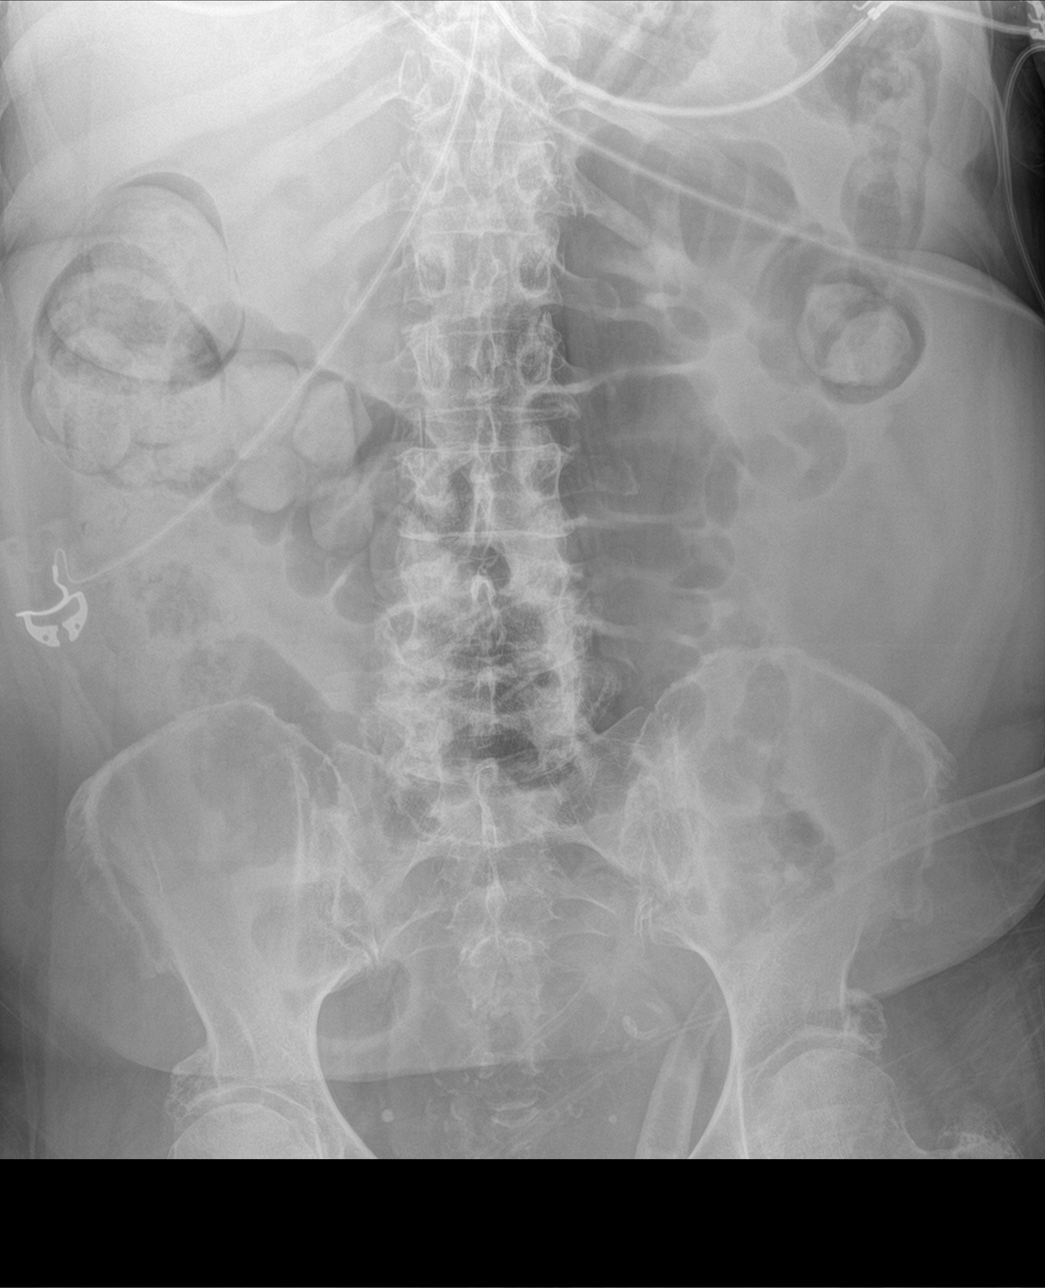

[3 of 3 positions shown; findings below may reference images not displayed]

FINDINGS: The upright chest x-ray demonstrates normal heart size and mild
tortuosity of the thoracic aorta. No acute pulmonary findings. No
pleural effusion. No pulmonary lesions.

Two views of the abdomen demonstrate the moderate amount of stool in
the right and proximal transverse colon. I do not see any
significant stool in the left colon, sigmoid colon or rectum.

No distended small bowel loops to suggest obstruction.

The soft tissue shadows are grossly maintained. No worrisome
calcifications. No significant bony findings.
IMPRESSION: 1. No acute cardiopulmonary findings.
2. Moderate stool in the right and proximal transverse colon.

## 2022-07-16 IMAGING — CT CT ABD-PELV W/O CM
2 of 4 series · 16 of 46 positions shown, 18 images · non-contrast
Comparison: CT dated November 26, 2020.

CLINICAL DATA: Bowel obstruction suspected.  Abdominal pain.

EXAM:
CT ABDOMEN AND PELVIS WITHOUT CONTRAST
TECHNIQUE: Multidetector CT imaging of the abdomen and pelvis was performed
following the standard protocol without IV contrast.

[Series 3: ap without · axial · non-contrast · 0.74mm/px · z∈[-842,-436]mm · 13 of 91 slices shown, 15 images]
[im 5/91  soft-tissue]
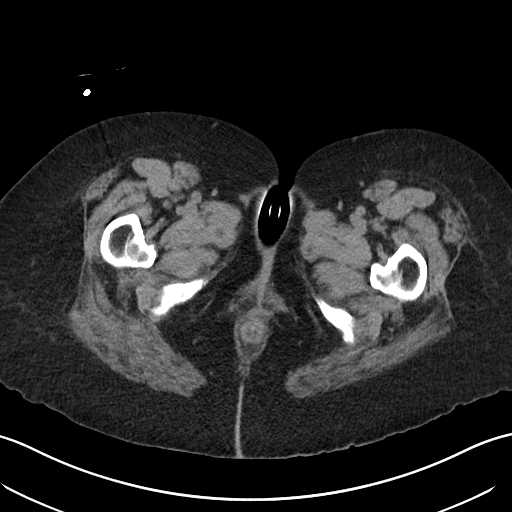
[im 5/91  bone]
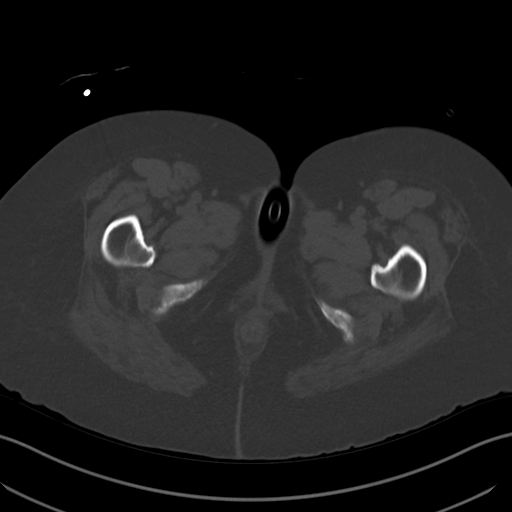
[im 14/91  soft-tissue]
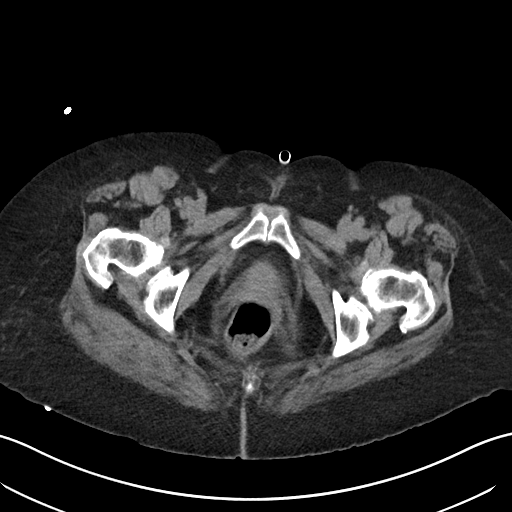
[im 19/91  soft-tissue]
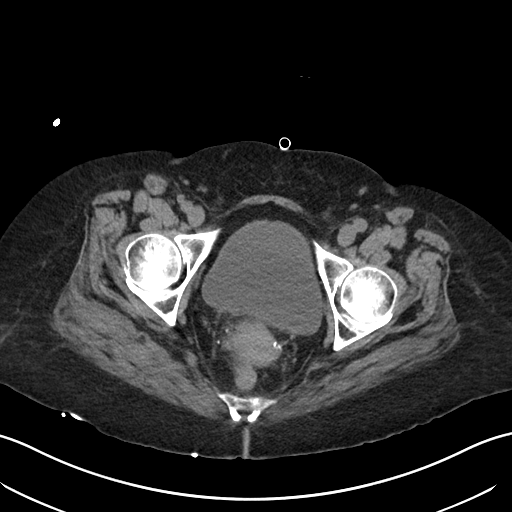
[im 28/91  soft-tissue]
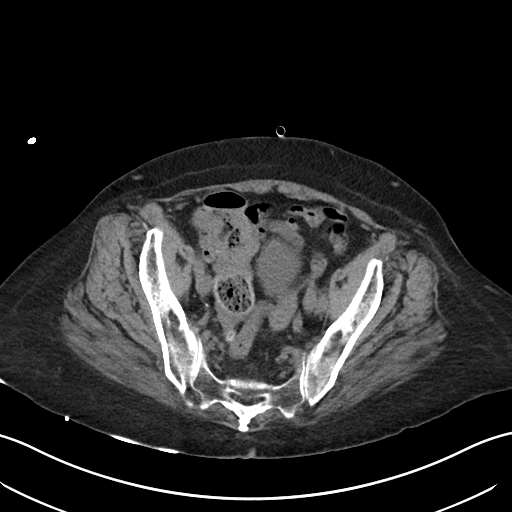
[im 32/91  soft-tissue]
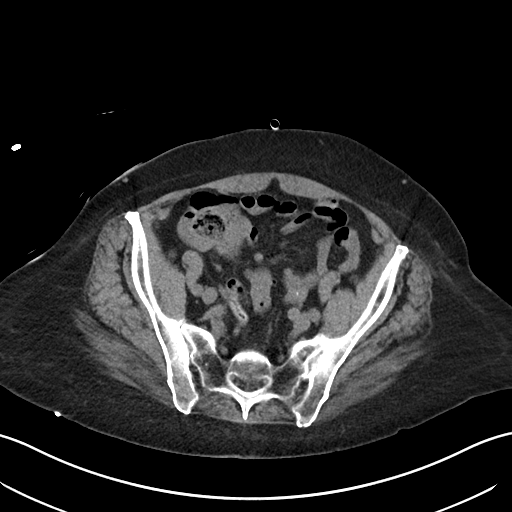
[im 41/91  soft-tissue]
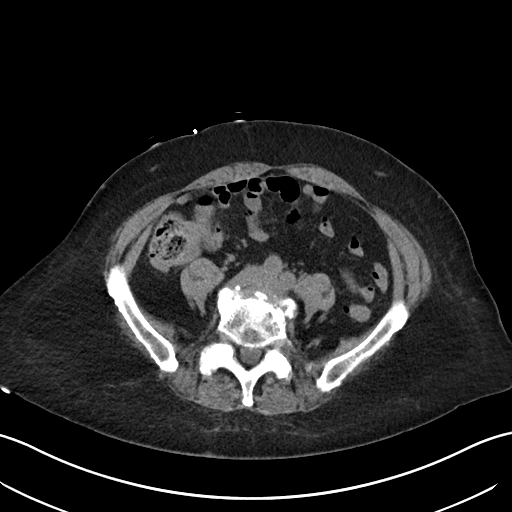
[im 46/91  soft-tissue]
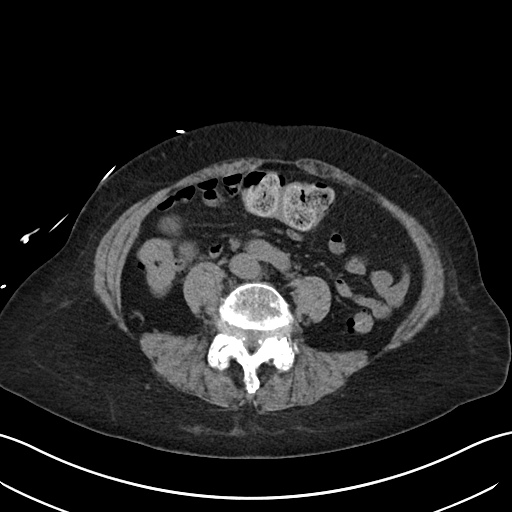
[im 50/91  soft-tissue]
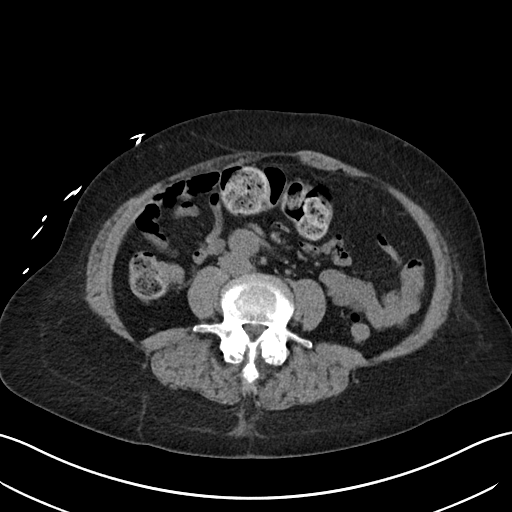
[im 59/91  soft-tissue]
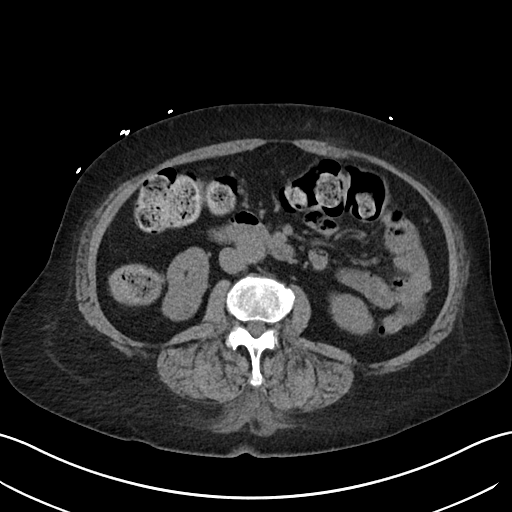
[im 59/91  bone]
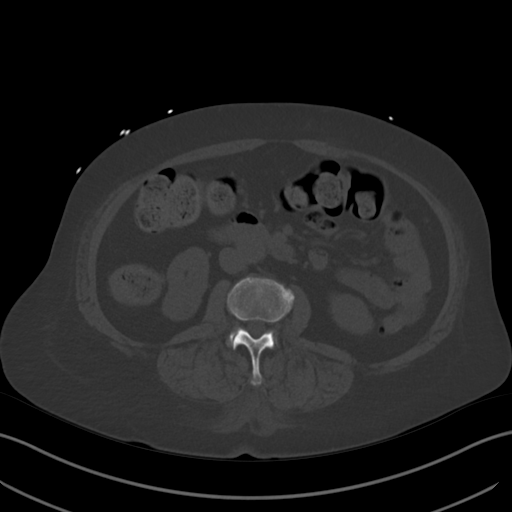
[im 64/91  soft-tissue]
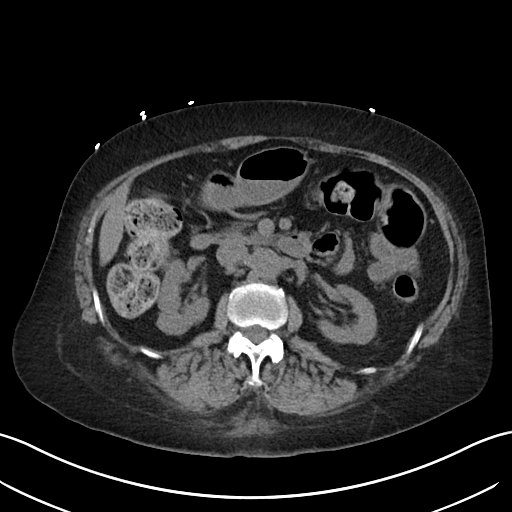
[im 73/91  soft-tissue]
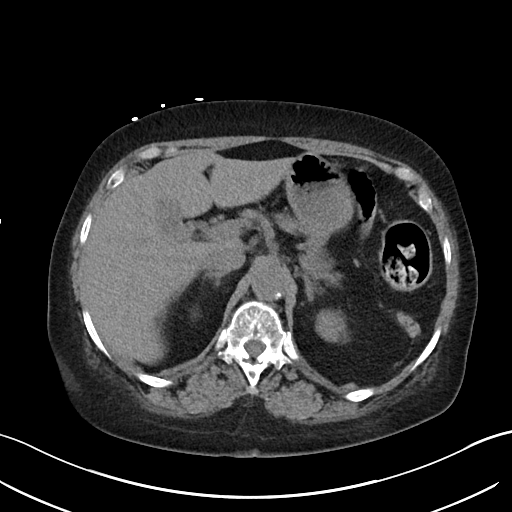
[im 77/91  soft-tissue]
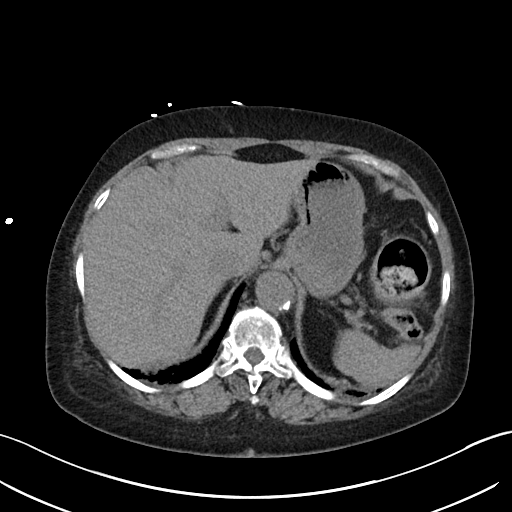
[im 86/91  soft-tissue]
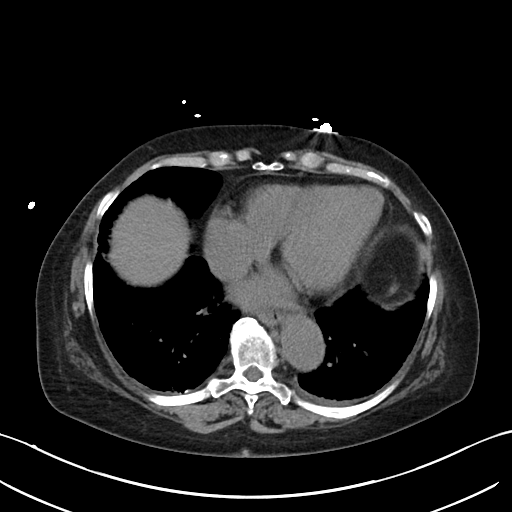

[Series 6: cor · coronal · 0.78mm/px · 3 of 91 slices shown]
[im 31/91  soft-tissue]
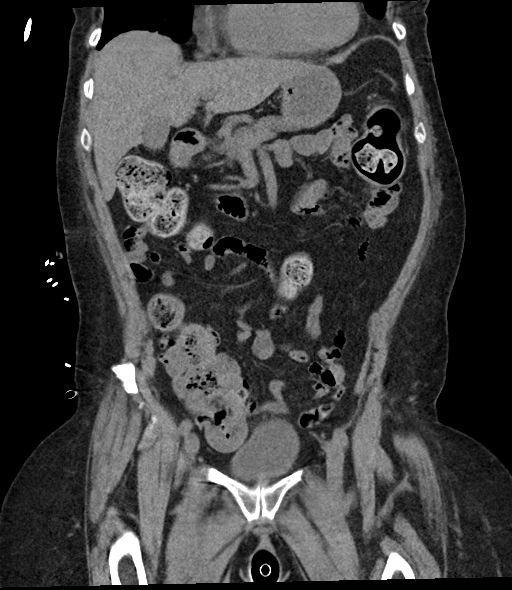
[im 41/91  soft-tissue]
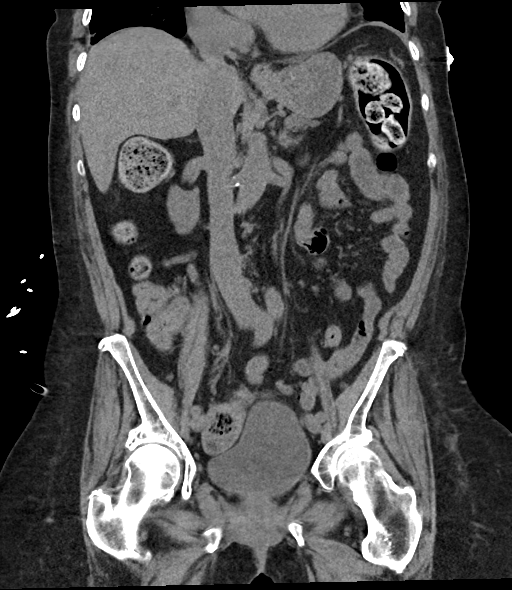
[im 51/91  soft-tissue]
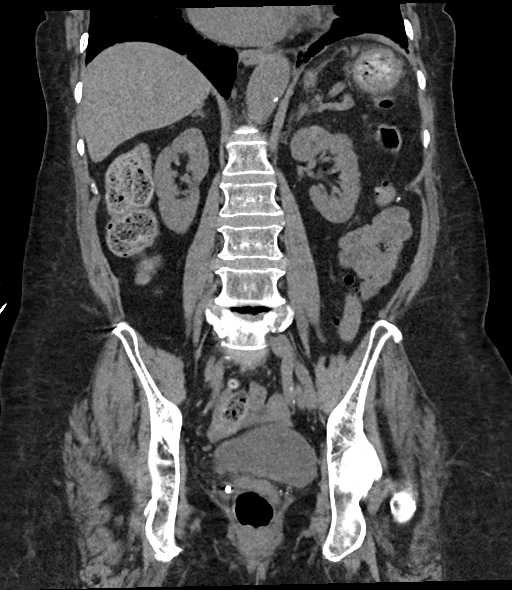

[16 of 46 positions shown; findings below may reference images not displayed]

FINDINGS: Lower chest: There is chronic scarring at the lung bases. There are
trace bilateral pleural effusions.The heart is enlarged.

Hepatobiliary: The liver is normal. Normal gallbladder.There is no
biliary ductal dilation.

Pancreas: Normal contours without ductal dilatation. No
peripancreatic fluid collection.

Spleen: Unremarkable.

Adrenals/Urinary Tract:

--Adrenal glands: Unremarkable.

--Right kidney/ureter: No hydronephrosis or radiopaque kidney
stones.

--Left kidney/ureter: No hydronephrosis or radiopaque kidney stones.

--Urinary bladder: Unremarkable.

Stomach/Bowel:

--Stomach/Duodenum: No hiatal hernia or other gastric abnormality.
Normal duodenal course and caliber.

--Small bowel: Unremarkable.

--Colon: Rectosigmoid diverticulosis without acute inflammation.
Again noted is significant narrowing at the hepatic flexure, chronic
across prior studies. There is a large amount of stool in the
transverse colon and right hemicolon.

--Appendix: Normal.

Vascular/Lymphatic: Atherosclerotic calcification is present within
the non-aneurysmal abdominal aorta, without hemodynamically
significant stenosis.

--No retroperitoneal lymphadenopathy.

--No mesenteric lymphadenopathy.

--No pelvic or inguinal lymphadenopathy.

Reproductive: Unremarkable

Other: No ascites or free air. The abdominal wall is normal.

Musculoskeletal. A chronic appearing compression fracture is noted
of the L5 vertebral body.
IMPRESSION: 1. No acute abdominopelvic abnormality.
2. Again noted is significant narrowing at the hepatic flexure of
the colon, chronic across prior studies.
3. Large amount of stool in the transverse colon and right
hemicolon.
4. Rectosigmoid diverticulosis without acute inflammation.
5. Trace bilateral pleural effusions with chronic scarring at the
lung bases.

Aortic Atherosclerosis ([id number]).

## 2022-07-16 IMAGING — DX DG CHEST 1V PORT
1 series · 1 of 1 positions shown · non-contrast
Comparison: Portable exam 2888 hours compared to 11/22/2020

CLINICAL DATA: Altered mental status, oriented to name only, unable
to follow commands, discharge from hospital on [REDACTED] for
small-bowel obstruction, last seen normal yesterday

EXAM:
PORTABLE CHEST 1 VIEW

[chest ap]
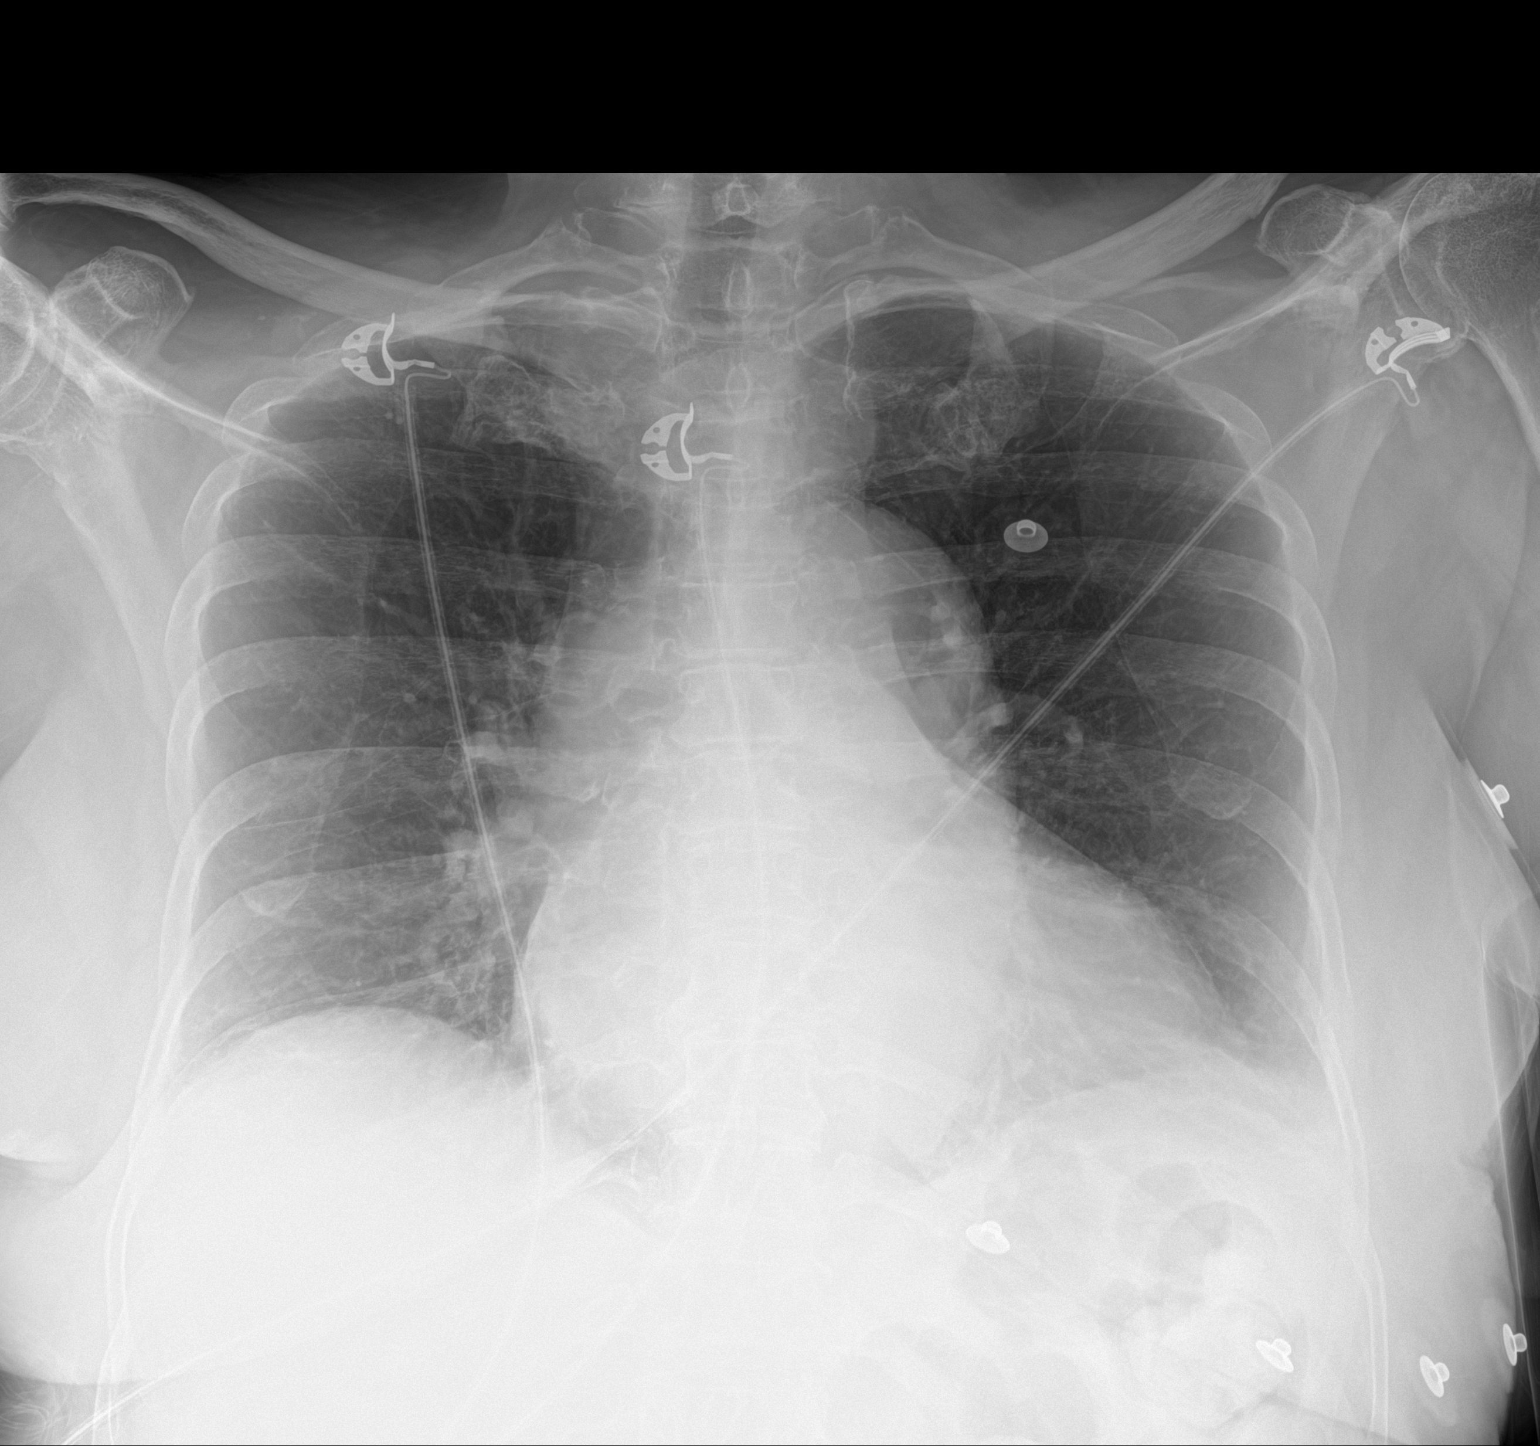

[1 of 1 positions shown; findings below may reference images not displayed]

FINDINGS: Enlargement of cardiac silhouette.

Mediastinal contours and pulmonary vascularity normal.

Minimal LEFT base atelectasis.

Lungs otherwise clear.

No acute infiltrate, pleural effusion or pneumothorax.

Bones demineralized.
IMPRESSION: Enlargement of cardiac silhouette with minimal LEFT basilar
atelectasis.

## 2022-07-16 IMAGING — MR MR HEAD W/O CM
12 of 13 series · 44 of 48 positions shown · non-contrast
Comparison: Head CT same day

CLINICAL DATA: Mental status changes of unknown cause.

EXAM:
MRI HEAD WITHOUT CONTRAST
TECHNIQUE: Multiplanar, multiecho pulse sequences of the brain and surrounding
structures were obtained without intravenous contrast.

[Series 5: DWI · axial · 3.0mm · 0.88mm/px · z∈[-93,+47]mm · 8 of 96 slices shown (1 of 4)]
[im 1/96]
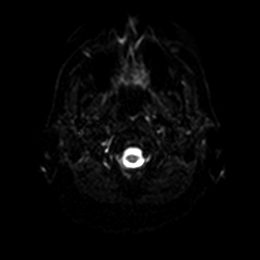
[im 14/96]
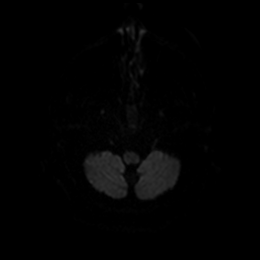
[im 28/96]
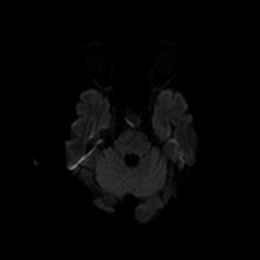
[im 41/96]
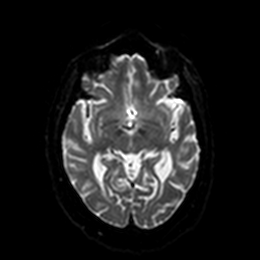
[im 55/96]
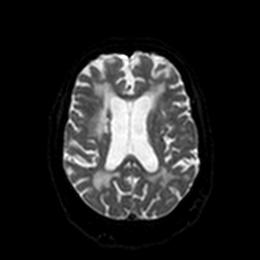
[im 68/96]
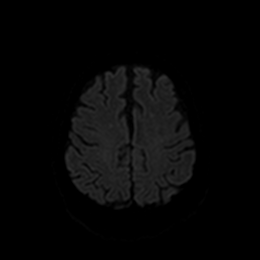
[im 82/96]
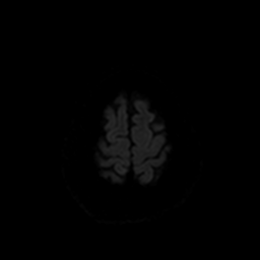
[im 96/96]
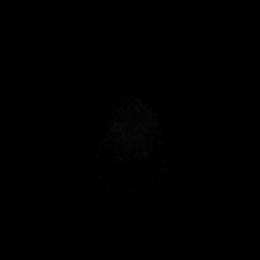

[Series 6: DWI · axial · 3.0mm · 0.88mm/px · z∈[-93,+47]mm · 4 of 48 slices shown (2 of 4)]
[im 1/48]
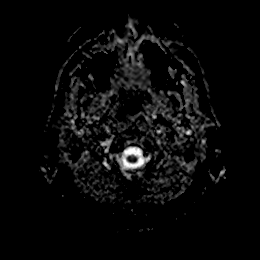
[im 16/48]
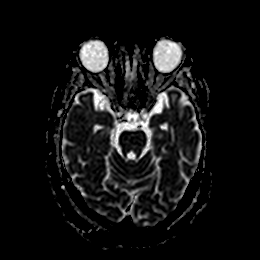
[im 32/48]
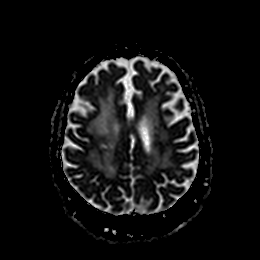
[im 48/48]
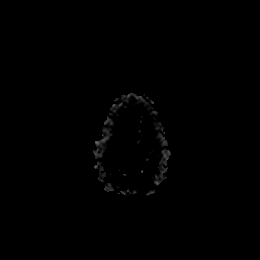

[Series 7: DWI · coronal · 4.0mm · 0.88mm/px · 5 of 66 slices shown (3 of 4)]
[im 1/66]
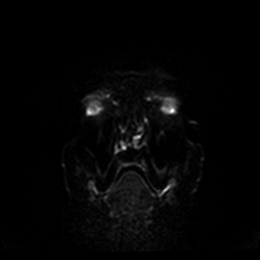
[im 17/66]
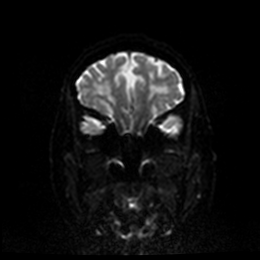
[im 33/66]
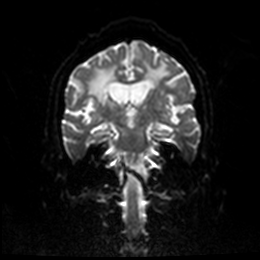
[im 49/66]
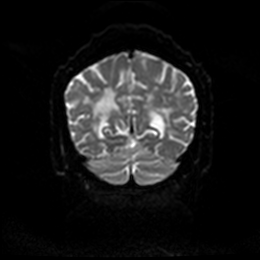
[im 66/66]
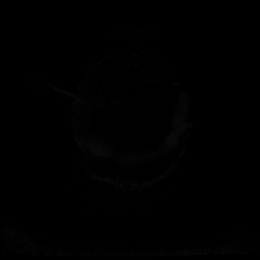

[Series 8: DWI · coronal · 4.0mm · 0.88mm/px · 3 of 33 slices shown (4 of 4)]
[im 1/33]
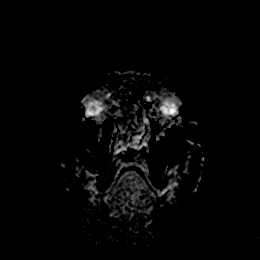
[im 17/33]
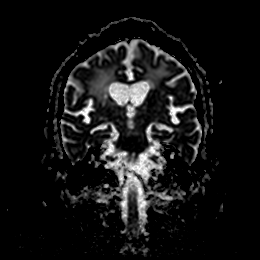
[im 33/33]
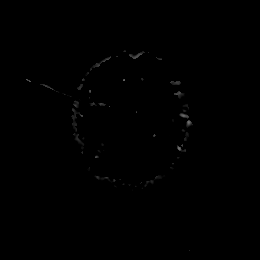

[Series 9: T1 · sagittal · 5.0mm · 0.75mm/px · 2 of 25 slices shown]
[im 1/25]
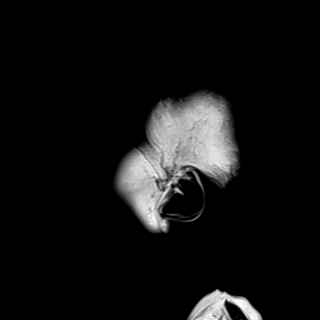
[im 25/25]
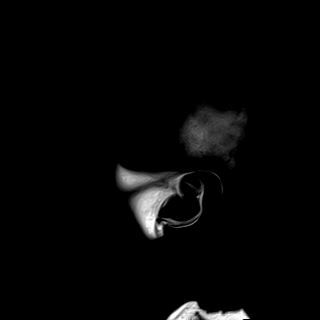

[Series 10: T2 · axial · 5.0mm · 0.72mm/px · z∈[-98,+46]mm · 2 of 25 slices shown (1 of 2)]
[im 1/25]
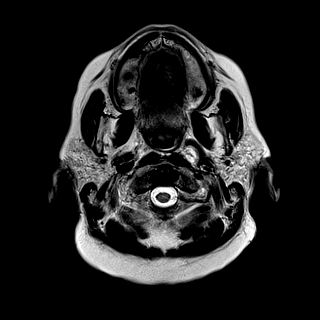
[im 25/25]
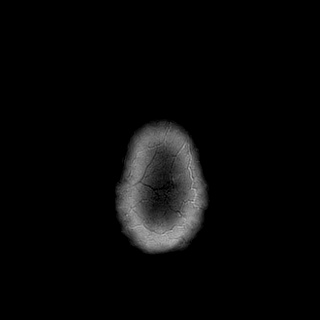

[Series 12: mag_images · axial · 3.0mm · 0.90mm/px · z∈[-101,+51]mm · 4 of 52 slices shown]
[im 1/52]
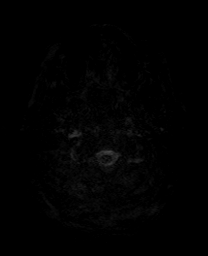
[im 18/52]
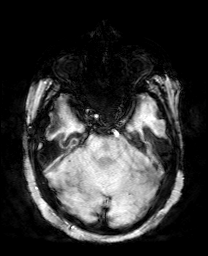
[im 35/52]
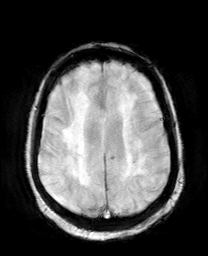
[im 52/52]
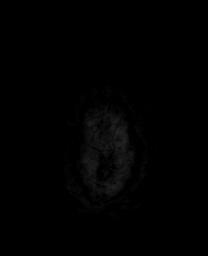

[Series 13: pha_images · axial · 3.0mm · 0.90mm/px · z∈[-101,+51]mm · 4 of 52 slices shown]
[im 1/52]
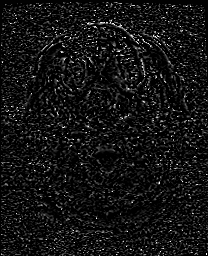
[im 18/52]
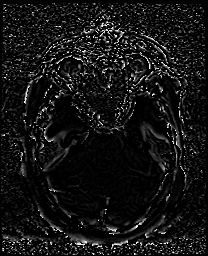
[im 35/52]
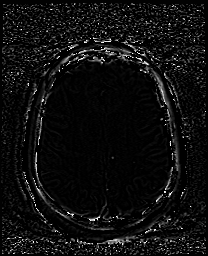
[im 52/52]
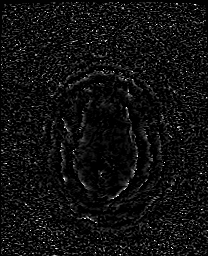

[Series 14: swi_images · axial · 3.0mm · 0.90mm/px · z∈[-101,+51]mm · 4 of 52 slices shown]
[im 1/52]
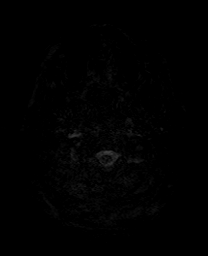
[im 18/52]
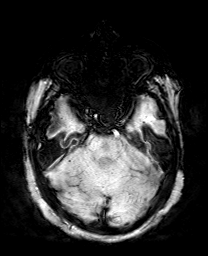
[im 35/52]
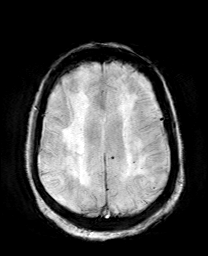
[im 52/52]
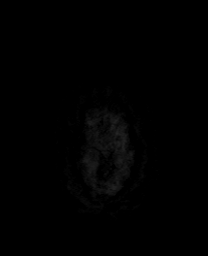

[Series 15: mip_images(sw) · axial · 24.0mm · 0.90mm/px · z∈[-91,+41]mm · 4 of 45 slices shown]
[im 1/45]
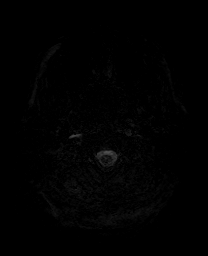
[im 15/45]
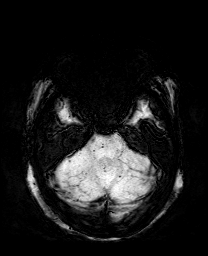
[im 30/45]
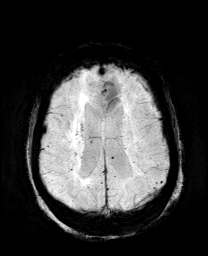
[im 45/45]
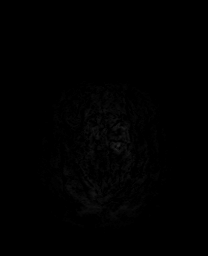

[Series 17: T2 · coronal · 5.0mm · 0.34mm/px · 2 of 29 slices shown (2 of 2)]
[im 1/29]
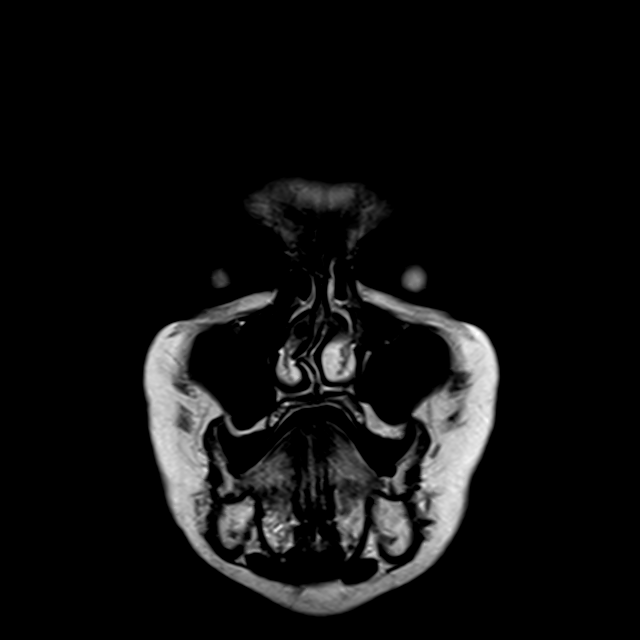
[im 29/29]
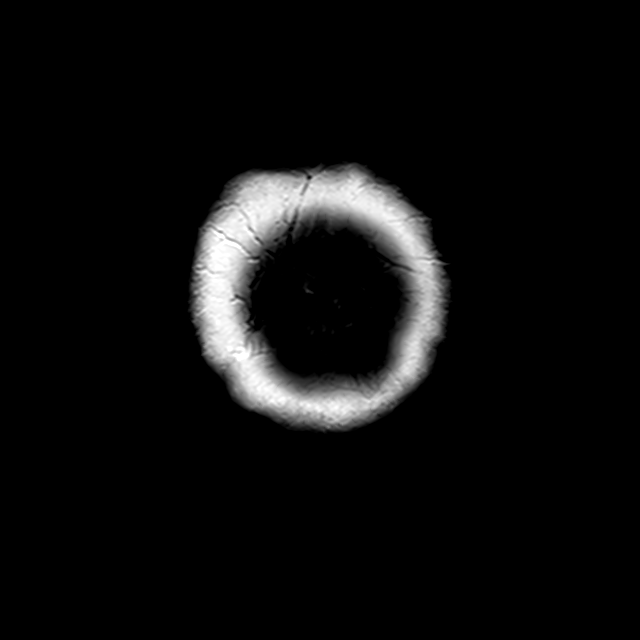

[Series 18: FLAIR · axial · 5.0mm · 0.90mm/px · z∈[-98,+46]mm · 2 of 25 slices shown]
[im 1/25]
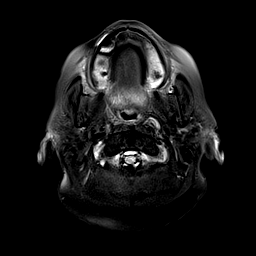
[im 25/25]
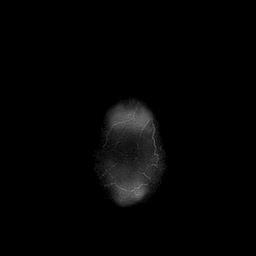

[44 of 48 positions shown; findings below may reference images not displayed]

FINDINGS: Brain: Diffusion imaging shows a tiny acute infarction within the
left cerebellum. No other acute finding. Mild chronic small-vessel
ischemic changes of the pons. Few old small vessel cerebellar
infarctions, including 1 with hemosiderin deposition on the right.
Cerebral hemispheres show old infarction in the right basal ganglia
and radiating white matter tracts. Old small vessel thalamic
infarctions. There is confluent chronic small vessel disease
throughout the hemispheric white matter. No large vessel territory
infarction. No mass lesion, hydrocephalus or extra-axial collection.
Numerous foci of hemosiderin deposition associated with the old
small vessel infarctions.

Vascular: Major vessels at the base of the brain show flow.

Skull and upper cervical spine: Negative

Sinuses/Orbits: Clear/normal

Other: None
IMPRESSION: Background pattern of advanced chronic small vessel disease
throughout the brain. Hemosiderin deposition associated with many of
the old small vessel infarctions. There appears to be a tiny acute
infarction in the left lateral cerebellum. It would be surprising if
this were the cause of the clinical presentation, as one might
expect this to be subclinical.

## 2022-08-22 IMAGING — CT CT CERVICAL SPINE W/O CM
3 of 4 series · 12 of 33 positions shown, 14 images · non-contrast
Comparison: Brain MRI 12/15/2020. cervical spine radiographs
02/14/2009.

CLINICAL DATA: Altered mental status. Additional history provided:
favoring right side, confusion, agitated.

EXAM:
CT HEAD WITHOUT CONTRAST
CT CERVICAL SPINE WITHOUT CONTRAST
TECHNIQUE: Multidetector CT imaging of the head and cervical spine was
performed following the standard protocol without intravenous
contrast. Multiplanar CT image reconstructions of the cervical spine
were also generated.

[Series 5: c_spine 2.0 st · axial · 0.29mm/px · z∈[-324,-216]mm · 4 of 82 slices shown, 5 images]
[im 14/82  soft-tissue]
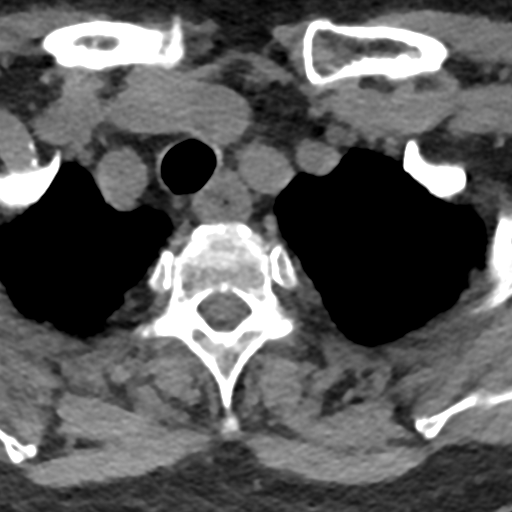
[im 14/82  bone]
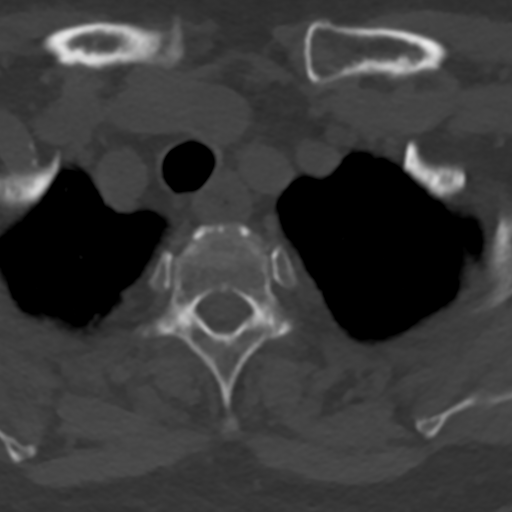
[im 28/82  bone]
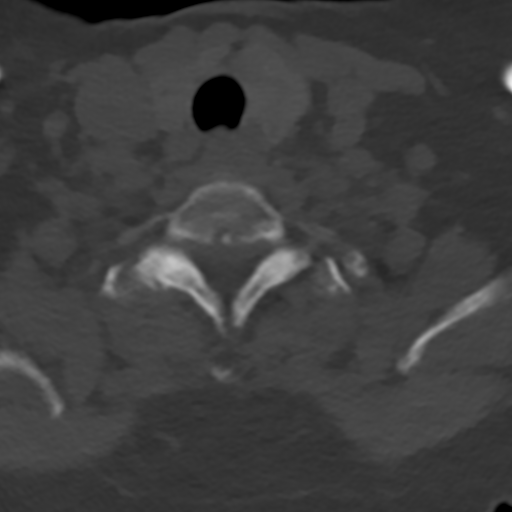
[im 55/82  bone]
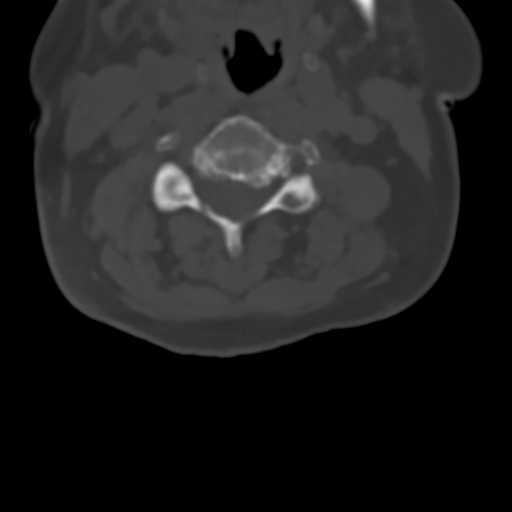
[im 68/82  bone]
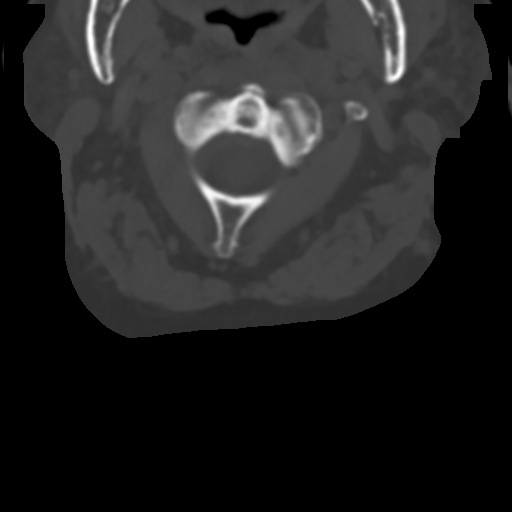

[Series 8: coronal bone · coronal · 0.23mm/px · 3 of 61 slices shown]
[im 13/61  bone]
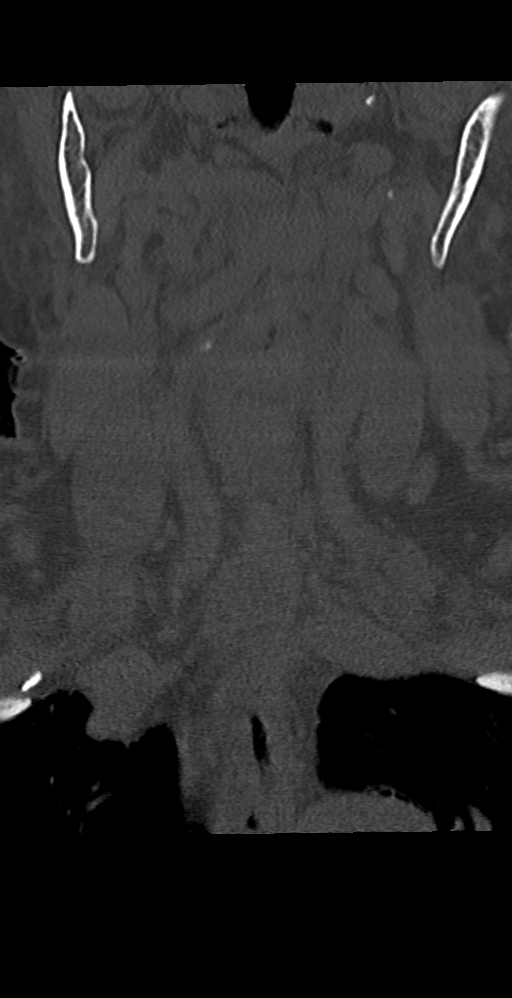
[im 25/61  bone]
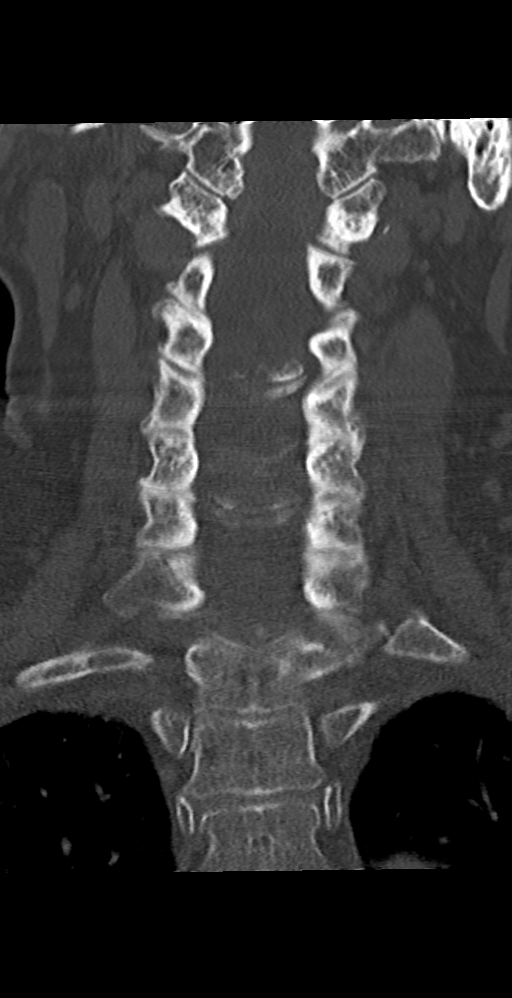
[im 37/61  bone]
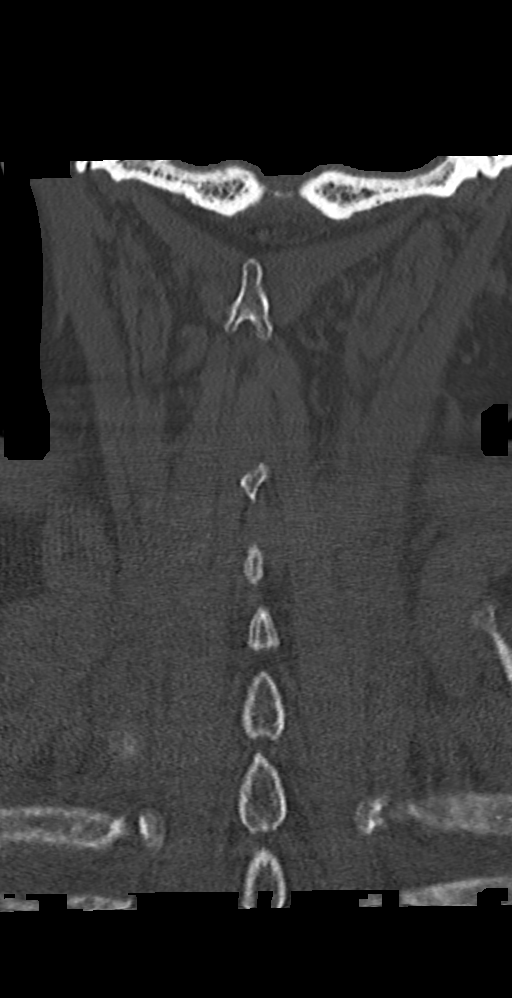

[Series 9: sagittal bone · sagittal · 0.33mm/px · 5 of 61 slices shown, 6 images]
[im 21/61  bone]
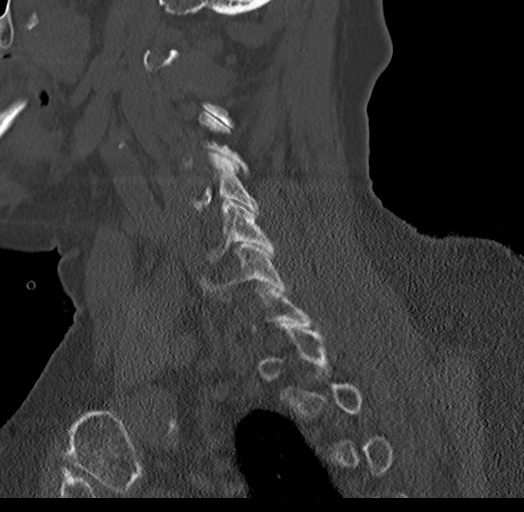
[im 26/61  bone]
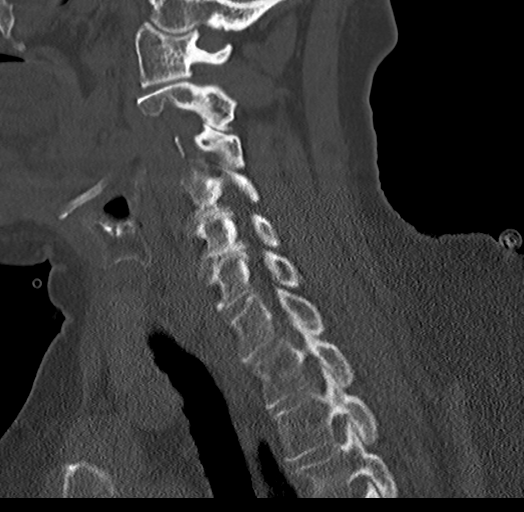
[im 31/61  soft-tissue]
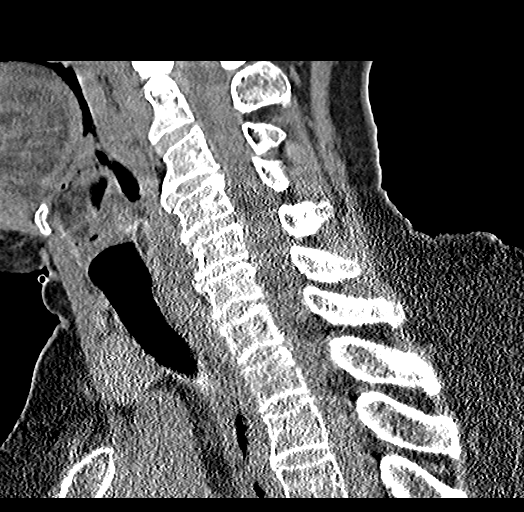
[im 31/61  bone]
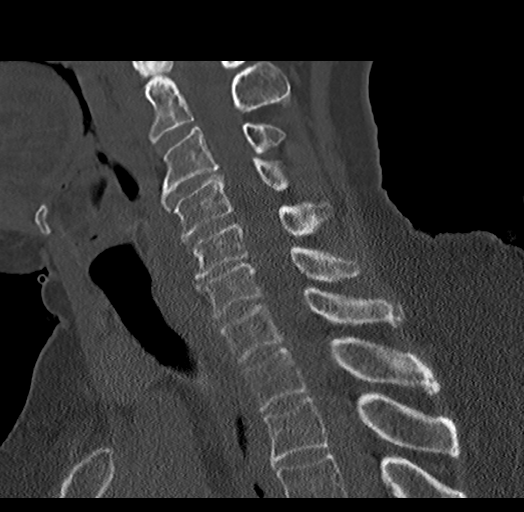
[im 36/61  bone]
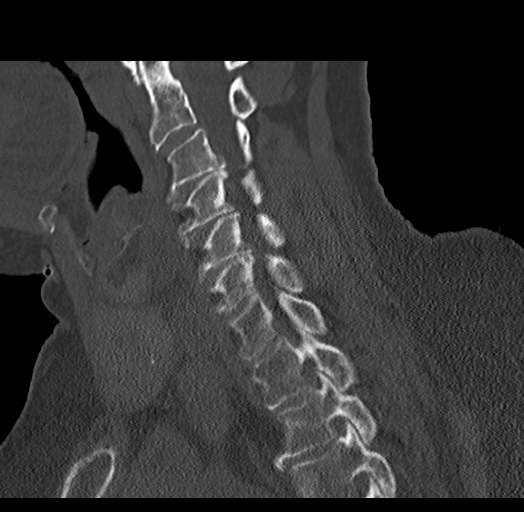
[im 41/61  bone]
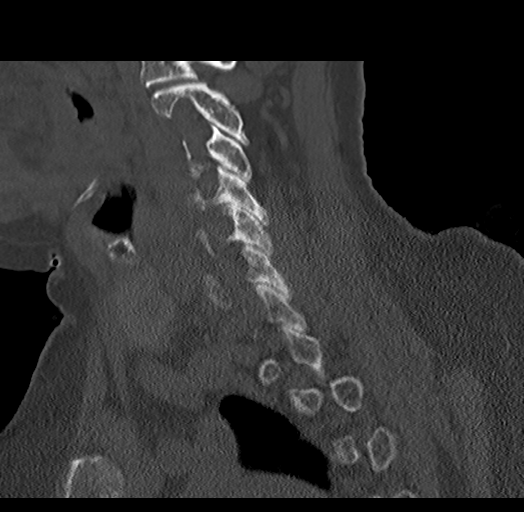

[12 of 33 positions shown; findings below may reference images not displayed]

FINDINGS: CT HEAD FINDINGS

Brain:

Mildly motion degraded exam.

Mild generalized parenchymal atrophy.

Redemonstrated chronic small-vessel infarcts within the right corona
radiata and within the deep gray nuclei. Background advanced patchy
and ill-defined hypoattenuation within the cerebral white matter is
nonspecific, but also compatible with chronic small vessel ischemic
disease.

Known small chronic infarcts within the bilateral cerebellar
hemispheres were better appreciated on the prior brain MRI of
12/15/2020.

There is no acute intracranial hemorrhage.

No demarcated cortical infarct.

No extra-axial fluid collection.

No evidence of intracranial mass.

No midline shift.

Vascular: No hyperdense vessel.  Atherosclerotic calcifications

Skull: Normal. Negative for fracture or focal lesion.

Sinuses/Orbits: Visualized orbits show no acute finding. No
significant paranasal sinus disease at the imaged levels.

Other: Mild right anterior scalp soft tissue swelling is questioned.

CT CERVICAL SPINE FINDINGS

Mildly motion degraded exam.

Alignment: Straightening of the expected cervical lordosis. No
significant spondylolisthesis.

Skull base and vertebrae: The basion-dental and atlanto-dental
intervals are maintained.No evidence of acute fracture to the
cervical spine.

Soft tissues and spinal canal: No prevertebral fluid or swelling. No
visible canal hematoma.

Disc levels: Cervical spondylosis with multilevel disc space
narrowing, central disc protrusions, disc bulges, uncovertebral
hypertrophy and facet arthrosis. Multilevel spinal canal stenosis.
Most notably, a disc bulge at C3-C4 contributes to suspected
mild/moderate spinal canal stenosis. Multilevel bony neural
foraminal narrowing.

Upper chest: No consolidation within the imaged lung apices. No
visible pneumothorax.

Other: Heterogeneous enhancement of the thyroid gland. Additionally,
there is asymmetric prominence of the left thyroid lobe. Multiple
thyroid nodules. Dominant left thyroid lobe nodule measuring up to
1.6 cm (series 5, image 57).
IMPRESSION: CT head:

1. Mildly motion degraded exam.
2. No evidence of acute intracranial abnormality.
3. Mild right anterior scalp soft tissue swelling is questioned.
4. Redemonstrated mild generalized parenchymal atrophy and advanced
chronic small vessel ischemic disease.

CT cervical spine:

1. Mildly motion degraded exam.
2. No evidence of acute fracture to the cervical spine.
3. Nonspecific straightening of the expected cervical lordosis.
4. Cervical spondylosis, as described.
5. Thyroid nodules, the largest measuring 1.6 cm within the left
lobe. Nonemergent thyroid ultrasound is recommended for further
evaluation.

## 2022-08-22 IMAGING — CR DG CHEST 1V PORT
1 series · 1 of 1 positions shown · non-contrast
Comparison: December 15, 2020

CLINICAL DATA: Altered mental status

EXAM:
PORTABLE CHEST 1 VIEW

[chest ap]
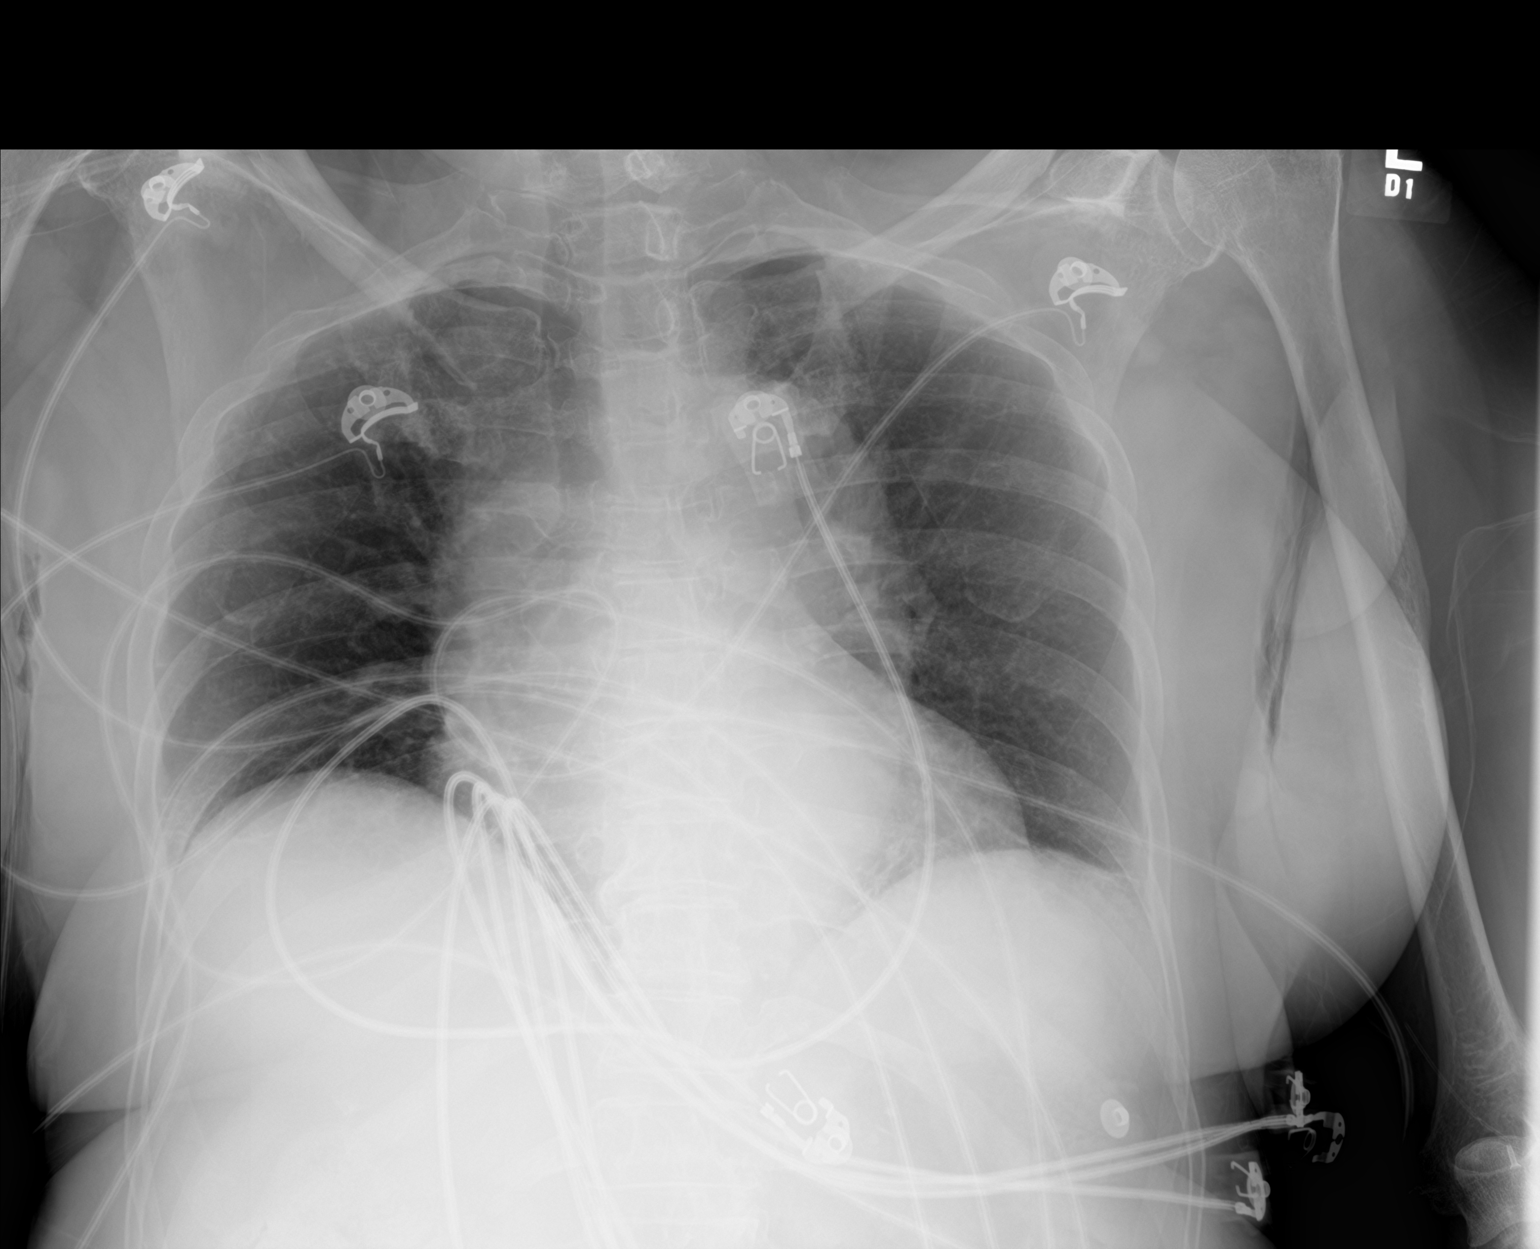

[1 of 1 positions shown; findings below may reference images not displayed]

FINDINGS: There is slight left base atelectasis. Lungs elsewhere clear. Heart
is mildly enlarged with pulmonary vascularity within normal limits.
There is prominence of the aorta, a stable finding. No adenopathy.
No bone lesions.
IMPRESSION: Slight left base atelectasis. Lungs elsewhere clear. Stable cardiac
prominence. Prominence of the aorta is a stable finding which likely
is due to chronic hypertension.

## 2022-08-24 IMAGING — DX DG ABDOMEN 1V
1 series · 1 of 1 positions shown · non-contrast
Comparison: CT 12/15/2020

CLINICAL DATA: Abdominal pain.

EXAM:
ABDOMEN - 1 VIEW

[abdomen]
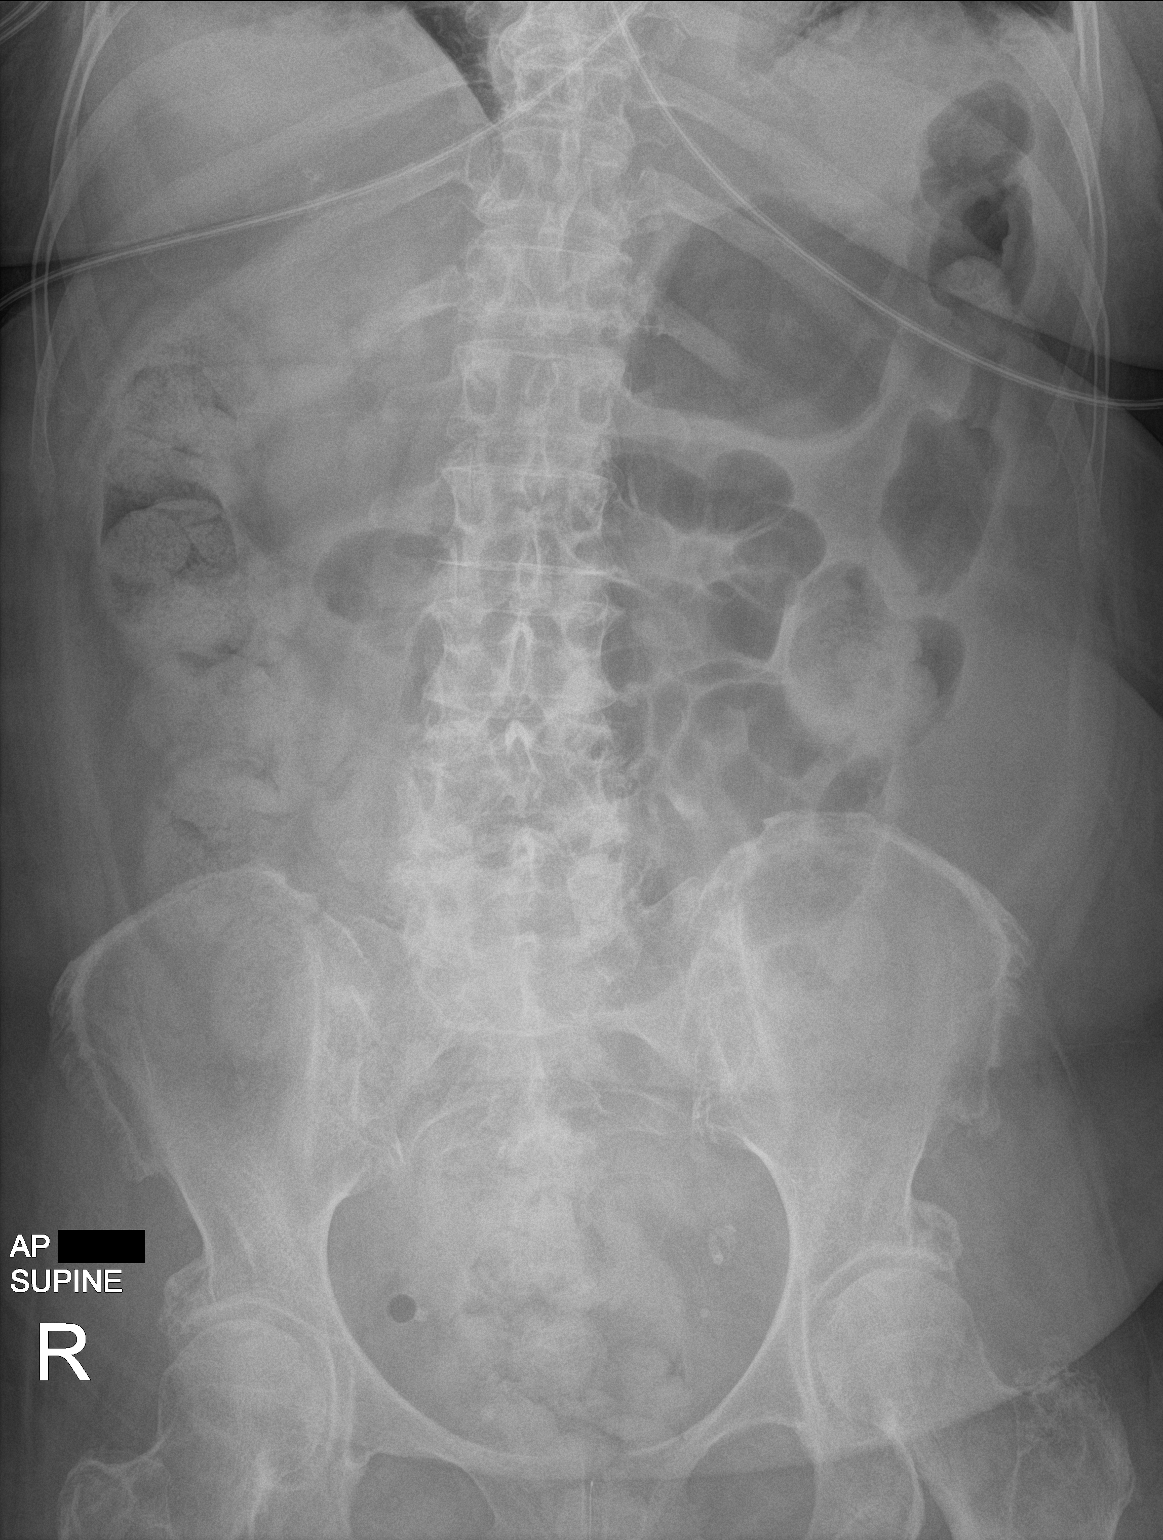

[1 of 1 positions shown; findings below may reference images not displayed]

FINDINGS: Portable supine view of the abdomen obtained. Normal bowel gas
pattern. No evidence of free air in the supine view. Moderate stool
in the ascending and rectosigmoid colon. Rectum mildly distended
with stool. No radiopaque calculi or abnormal soft tissue
calcifications. Pelvic calcifications correspond to phleboliths on
prior exam. No evidence of acute osseous abnormality.
IMPRESSION: Moderate stool in the ascending and rectosigmoid colon. No bowel
obstruction.

## 2022-10-03 IMAGING — US US THYROID
1 series · 12 of 25 positions shown · non-contrast
Comparison: 01/21/2021

CLINICAL DATA: Incidental on CT.

EXAM:
THYROID ULTRASOUND
TECHNIQUE: Ultrasound examination of the thyroid gland and adjacent soft
tissues was performed.

[Series 1: us thyroid · 0.06mm/px · 12 of 46 slices shown]
[im 2/46]
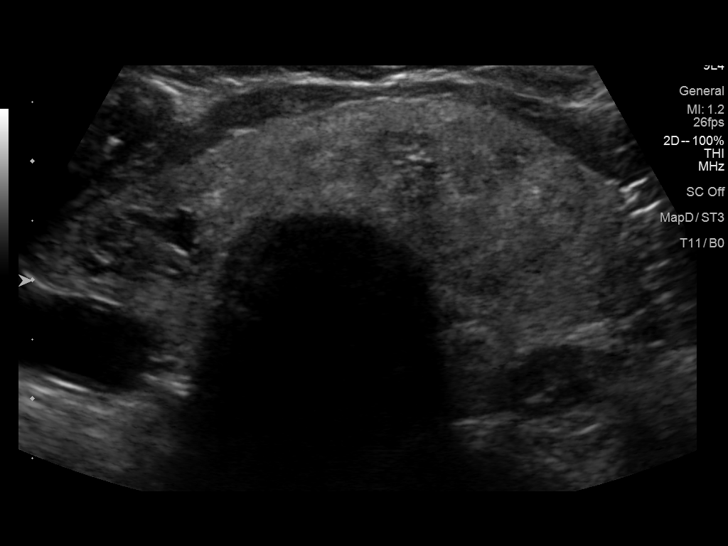
[im 6/46]
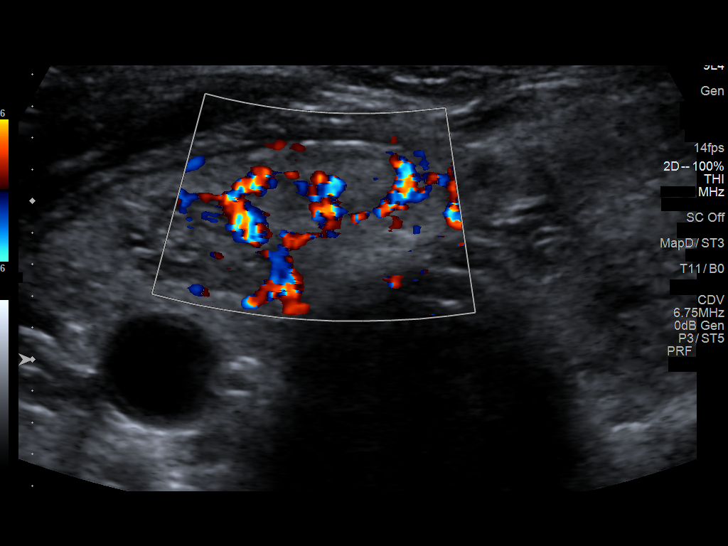
[im 10/46]
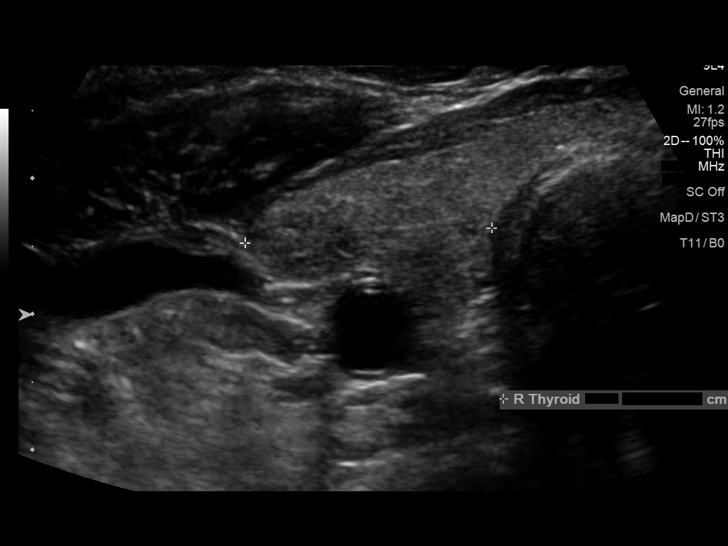
[im 14/46]
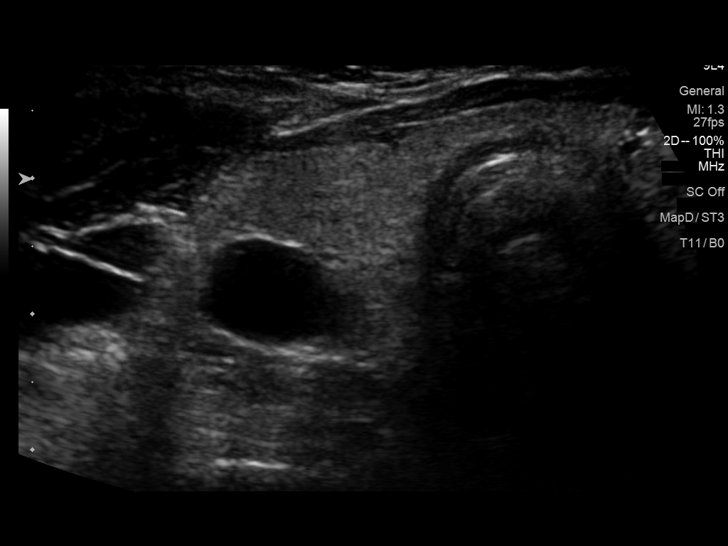
[im 17/46]
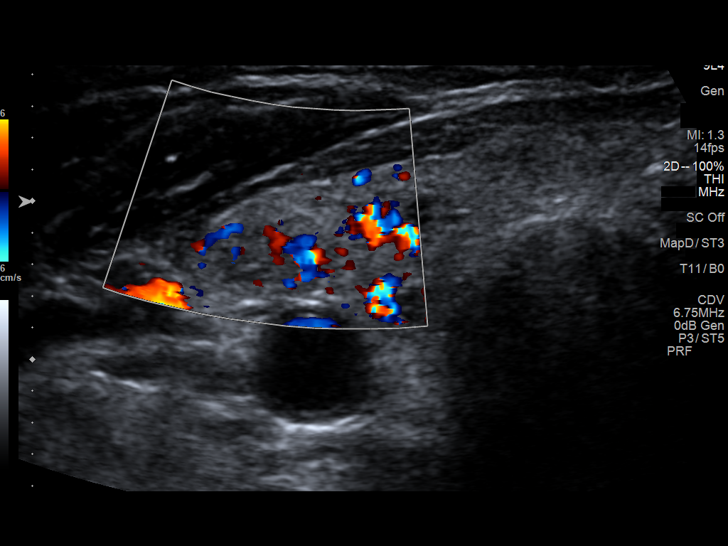
[im 21/46]
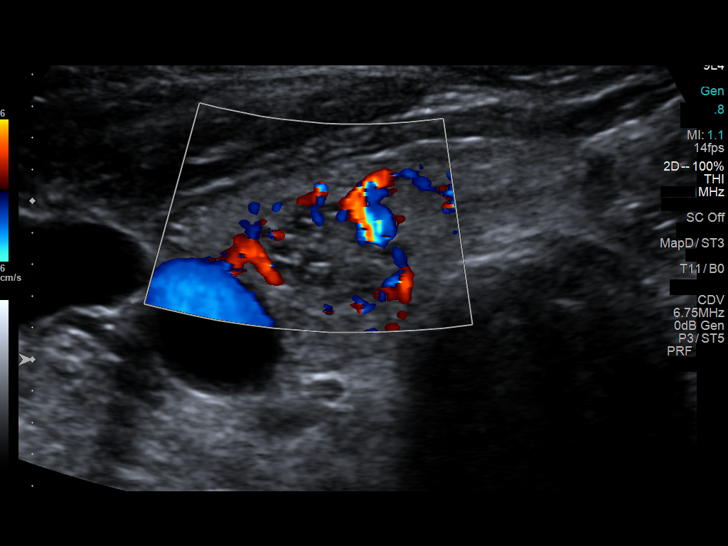
[im 25/46]
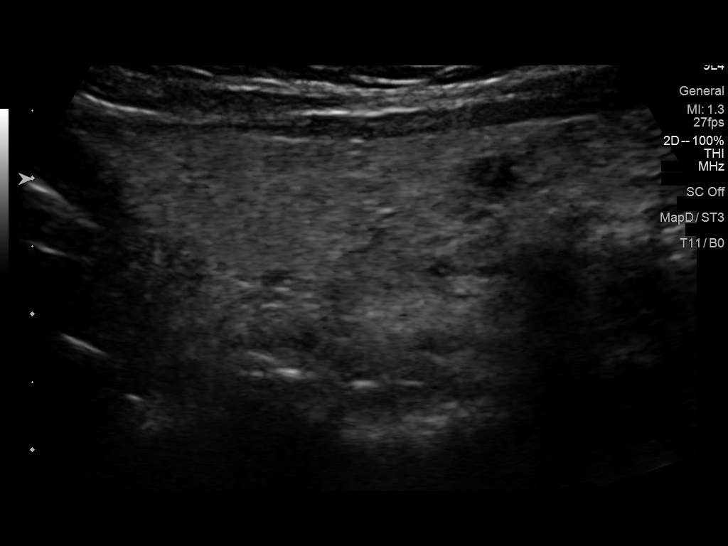
[im 29/46]
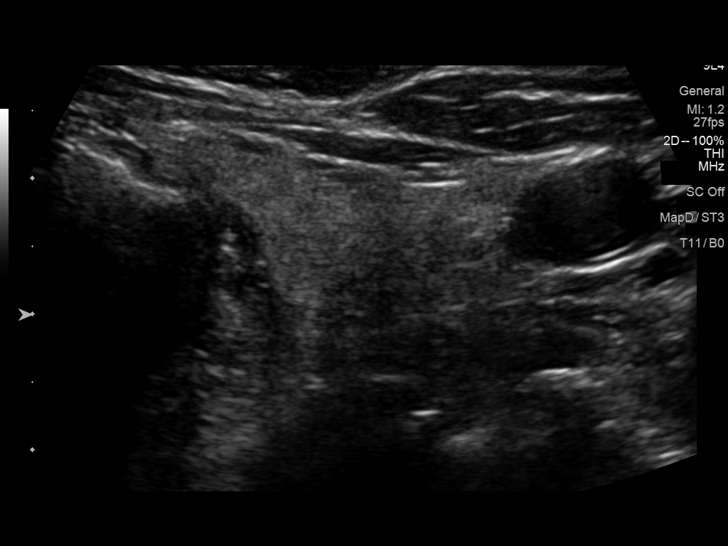
[im 32/46]
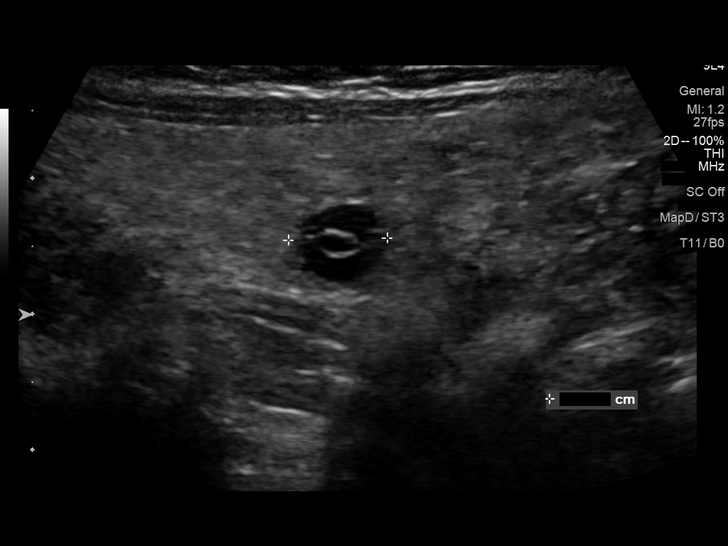
[im 36/46]
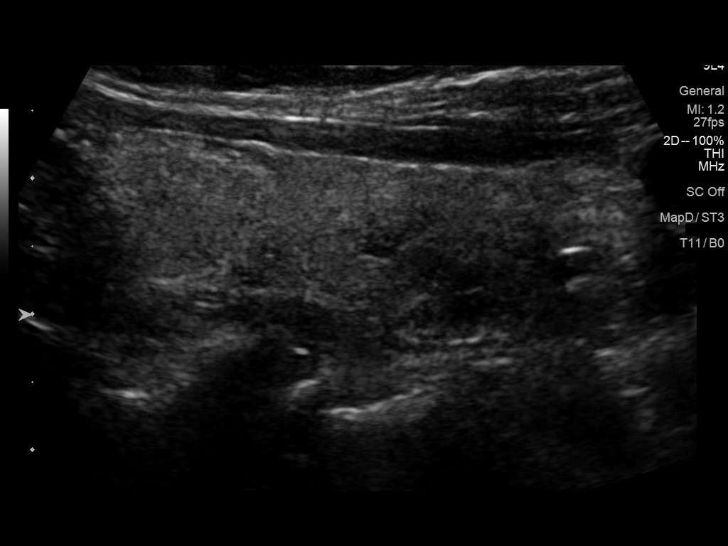
[im 40/46]
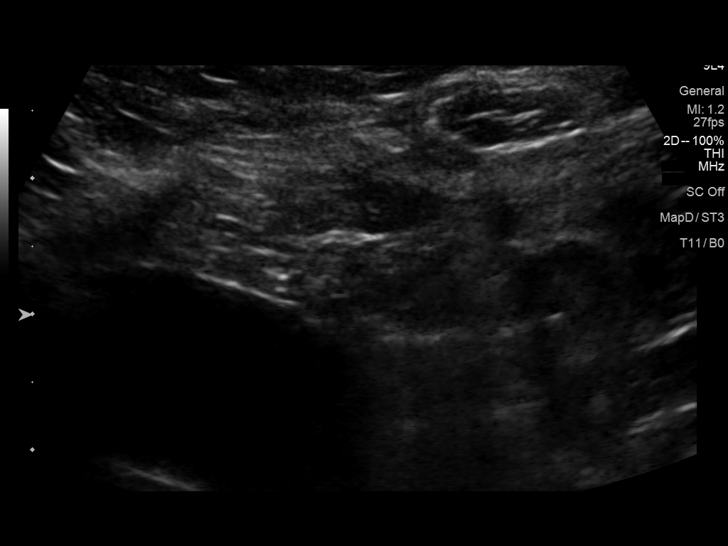
[im 44/46]
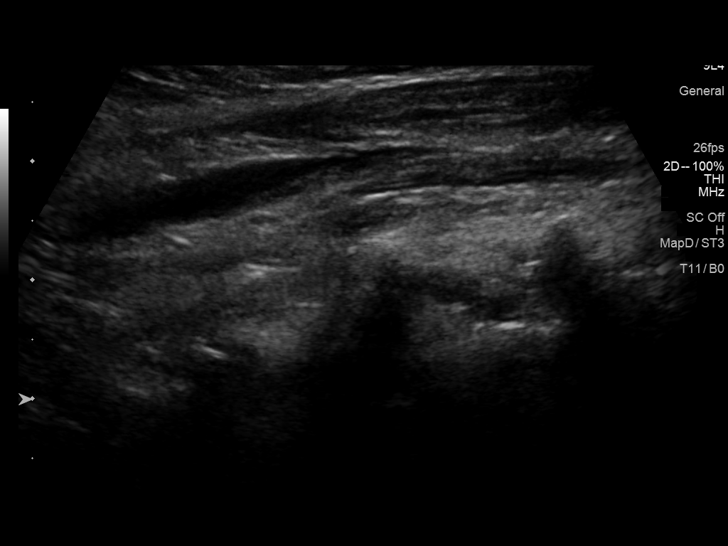

[12 of 25 positions shown; findings below may reference images not displayed]

FINDINGS: Parenchymal Echotexture: Moderately heterogenous

Isthmus: 0.8 cm

Right lobe: 5.0 x 1.8 x 1.8 cm

Left lobe: 4.8 x 1.8 x 2.0 cm

_________________________________________________________

Estimated total number of nodules >/= 1 cm: 4

Number of spongiform nodules >/=  2 cm not described below (TR1): 0

Number of mixed cystic and solid nodules >/= 1.5 cm not described
below (TR2): 0

_________________________________________________________

Nodule # 1:

Location: Isthmus; Mid

Maximum size: 1.3 cm; Other 2 dimensions: 1.2 x 0.6 cm

Composition: solid/almost completely solid (2)

Echogenicity: isoechoic (1)

Shape: not taller-than-wide (0)

Margins: ill-defined (0)

Echogenic foci: none (0)

ACR TI-RADS total points: 3.

ACR TI-RADS risk category: TR3 (3 points).

ACR TI-RADS recommendations:

Given size (<1.4 cm) and appearance, this nodule does NOT meet
TI-RADS criteria for biopsy or dedicated follow-up.

_________________________________________________________

Nodule # 2:

Location: Isthmus; Inferior

Maximum size: 3.1 cm; Other 2 dimensions: 2.6 x 2.0 cm

Composition: solid/almost completely solid (2)

Echogenicity: isoechoic (1)

Shape: not taller-than-wide (0)

Margins: ill-defined (0)

Echogenic foci: none (0)

ACR TI-RADS total points: 3.

ACR TI-RADS risk category: TR3 (3 points).

ACR TI-RADS recommendations:

**Given size (>/= 2.5 cm) and appearance, fine needle aspiration of
this mildly suspicious nodule should be considered based on TI-RADS
criteria.

_________________________________________________________

Nodule # 3:

Location: Right; Mid

Maximum size: 1.0 cm; Other 2 dimensions: 1.0 x 0.7 cm

Composition: solid/almost completely solid (2)

Echogenicity: isoechoic (1)

Shape: not taller-than-wide (0)

Margins: ill-defined (0)

Echogenic foci: none (0)

ACR TI-RADS total points: 3.

ACR TI-RADS risk category: TR3 (3 points).

ACR TI-RADS recommendations:

Given size (<1.4 cm) and appearance, this nodule does NOT meet
TI-RADS criteria for biopsy or dedicated follow-up.

_________________________________________________________

Nodule # 4:

Location: Right; Inferior

Maximum size: 1.1 cm; Other 2 dimensions: 1.1 x 0.8 cm

Composition: spongiform (0)

Echogenicity: hypoechoic (2)

Shape: not taller-than-wide (0)

Margins: ill-defined (0)

Echogenic foci: none (0)

ACR TI-RADS total points: 2.

ACR TI-RADS risk category: TR2 (2 points).

ACR TI-RADS recommendations:

This nodule does NOT meet TI-RADS criteria for biopsy or dedicated
follow-up.

_________________________________________________________

Nodule # 5:

Location: Left; Mid

Maximum size: 0.8 cm; Other 2 dimensions: 0.7 x 0.7 cm

Composition: cystic/almost completely cystic (0)

Echogenicity: anechoic (0)

Shape: not taller-than-wide (0)

Margins: smooth (0)

Echogenic foci: none (0)

ACR TI-RADS total points: 0.

ACR TI-RADS risk category: TR1 (0-1 points).

ACR TI-RADS recommendations:

This nodule does NOT meet TI-RADS criteria for biopsy or dedicated
follow-up.

_________________________________________________________
IMPRESSION: 1. Multinodular goiter.
2. Dominant solid nodule arising from the inferior aspect of the
isthmus (labeled 2, 3.1 cm) which meets criteria (TI-RADS category
3) for tissue sampling. Recommend ultrasound-guided fine-needle
aspiration.
3. Remaining bilateral scattered thyroid nodules appear benign and
do not warrant additional ultrasound follow-up or tissue sampling.

The above is in keeping with the ACR TI-RADS recommendations - [HOSPITAL] 2120;[DATE].

## 2022-10-07 IMAGING — US US FNA BIOPSY THYROID 1ST LESION
1 series · 13 of 18 positions shown · non-contrast
Comparison: US THYROID 03/04/2021

MEDICATIONS:
4 mL 1% lidocaine

COMPLICATIONS:
None immediate.

INDICATION: Indeterminate thyroid nodule of the isthmus. Request is made for
fine-needle aspiration of indeterminate thyroid nodule.

EXAM:
ULTRASOUND GUIDED FINE NEEDLE ASPIRATION OF INDETERMINATE THYROID
NODULE
TECHNIQUE: Informed written consent was obtained from the patient after a
discussion of the risks, benefits and alternatives to treatment.
Questions regarding the procedure were encouraged and answered. A
timeout was performed prior to the initiation of the procedure.

[Series 1: us fna biopsy thyroid 1st lesion · 0.06mm/px · 18 acquisitions, 13 frames shown]
[im 1/18]
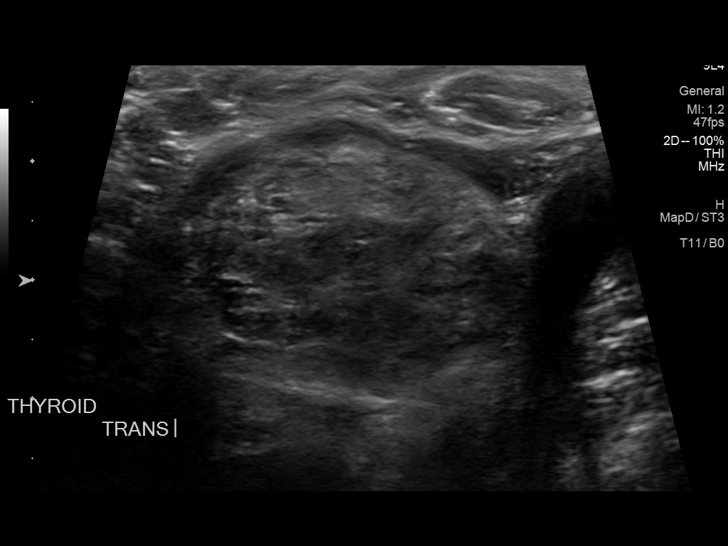
[im 3/18]
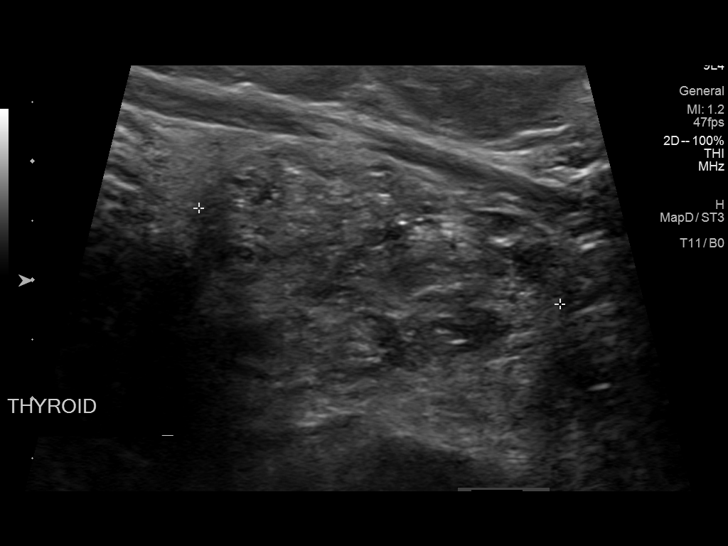
[im 4/18]
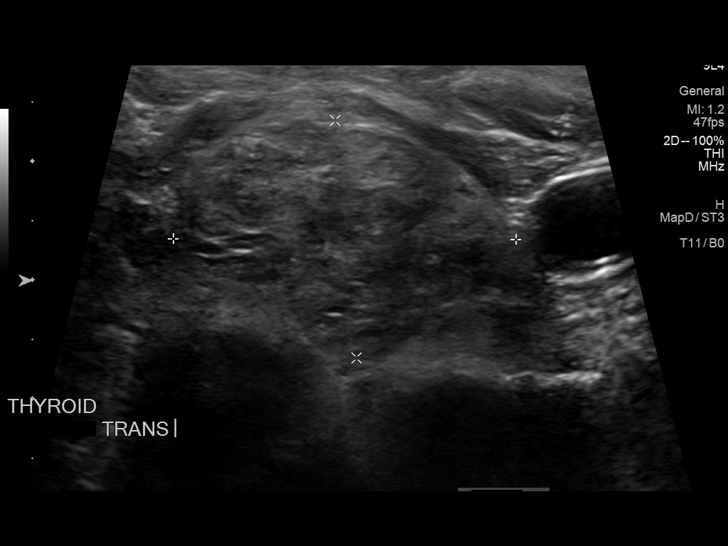
[im 5/18]
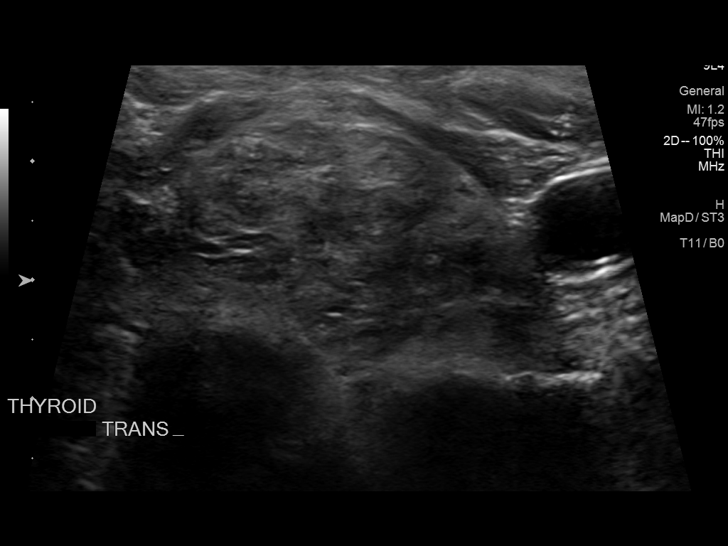
[im 7/18]
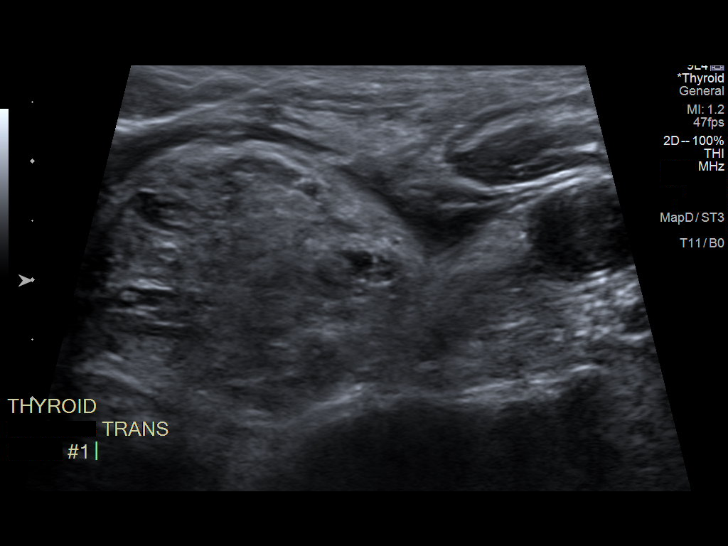
[im 8/18]
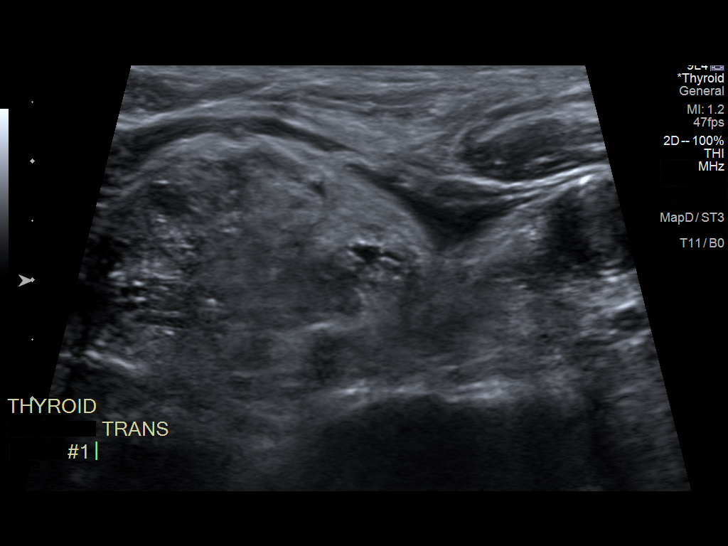
[im 10/18]
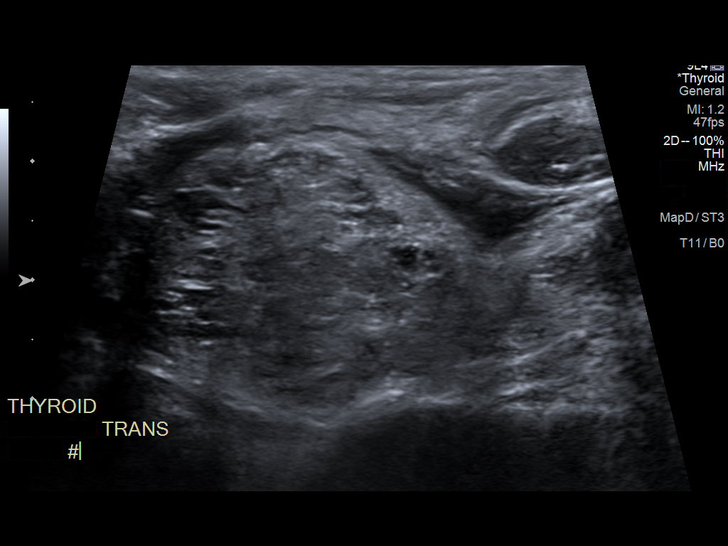
[im 11/18]
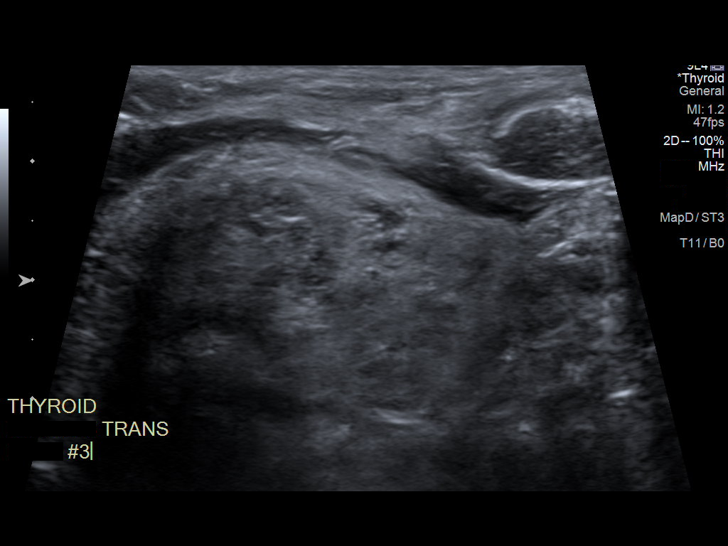
[im 12/18]
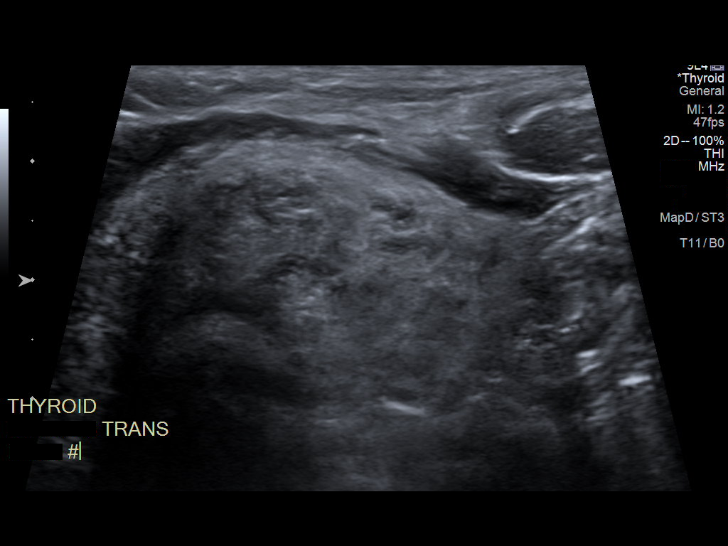
[im 14/18]
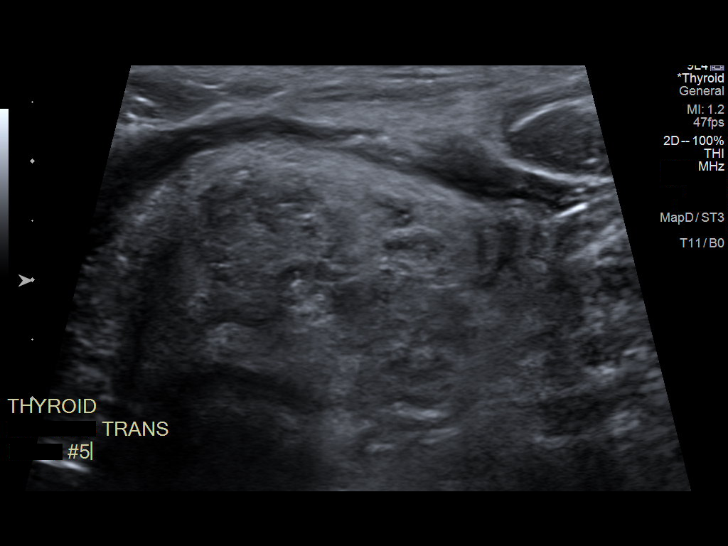
[im 15/18]
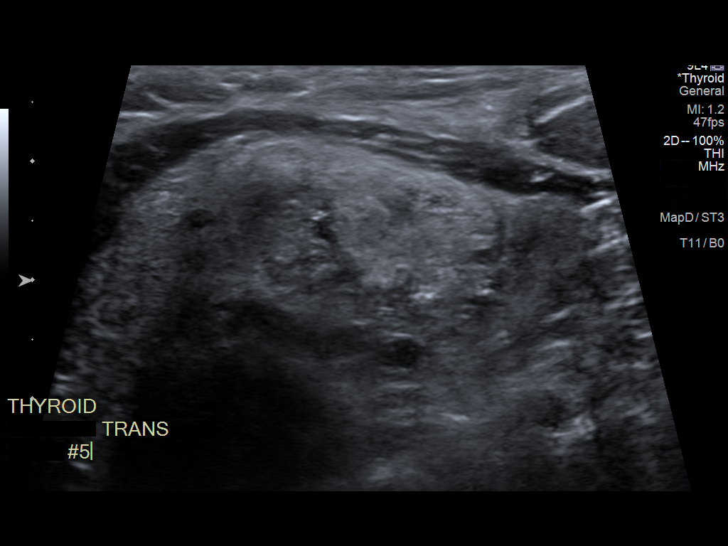
[im 16/18]
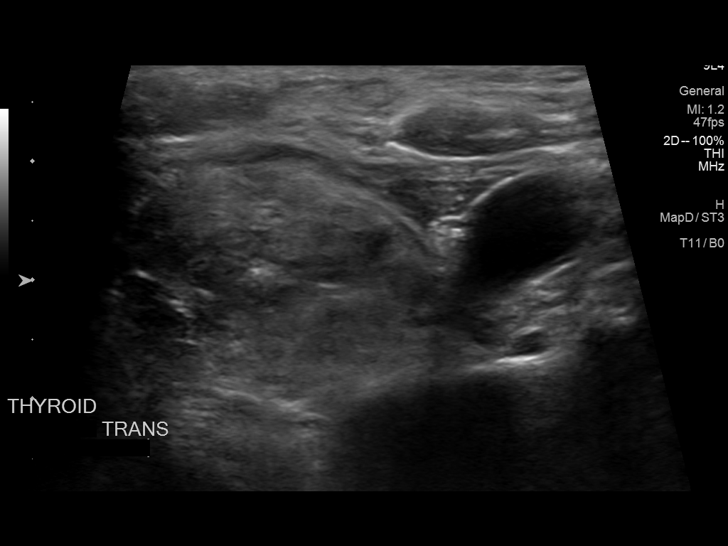
[im 18/18]
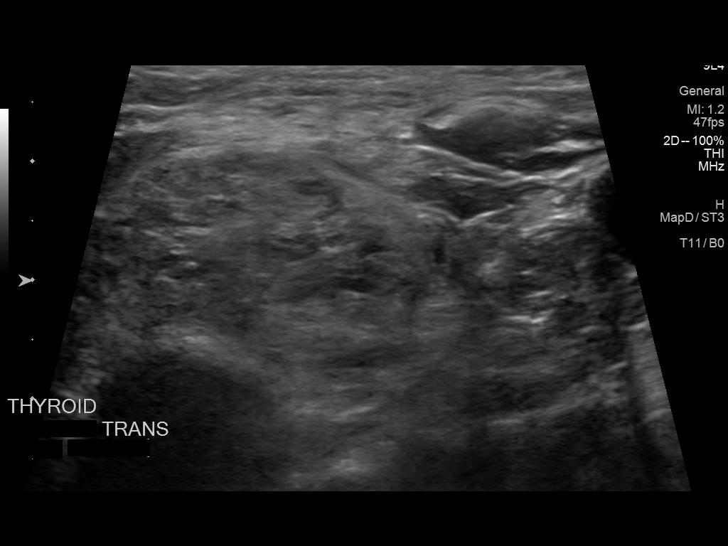

[13 of 18 positions shown; findings below may reference images not displayed]

Pre-procedural ultrasound scanning demonstrated unchanged size and
appearance of the indeterminate nodule within the isthmus.

The procedure was planned. The neck was prepped in the usual sterile
fashion, and a sterile drape was applied covering the operative
field. A timeout was performed prior to the initiation of the
procedure. Local anesthesia was provided with 1% lidocaine.

Under direct ultrasound guidance, 4 FNA biopsies were performed of
the isthmus nodule with a 25 gauge needle. Multiple ultrasound
images were saved for procedural documentation purposes. The samples
were prepared and submitted to pathology. Sample was also prepared
for Afirma testing.

Limited post procedural scanning was negative for hematoma or
additional complication. Dressings were placed. The patient
tolerated the above procedures procedure well without immediate
postprocedural complication.
FINDINGS: Nodule reference number based on prior diagnostic ultrasound: 2

Maximum size: 3.1 cm

Location: Isthmus;

ACR TI-RADS risk category: TR3 (3 points)

Reason for biopsy: meets ACR TI-RADS criteria

Ultrasound imaging confirms appropriate placement of the needles
within the thyroid nodule.
IMPRESSION: Technically successful ultrasound guided fine needle aspiration of
indeterminate isthmus nodule.

## 2022-10-18 IMAGING — DX DG CHEST 1V PORT
1 series · 1 of 1 positions shown · non-contrast
Comparison: 01/21/2021

CLINICAL DATA: Mental status changes.  Weakness.

EXAM:
PORTABLE CHEST 1 VIEW

[chest]
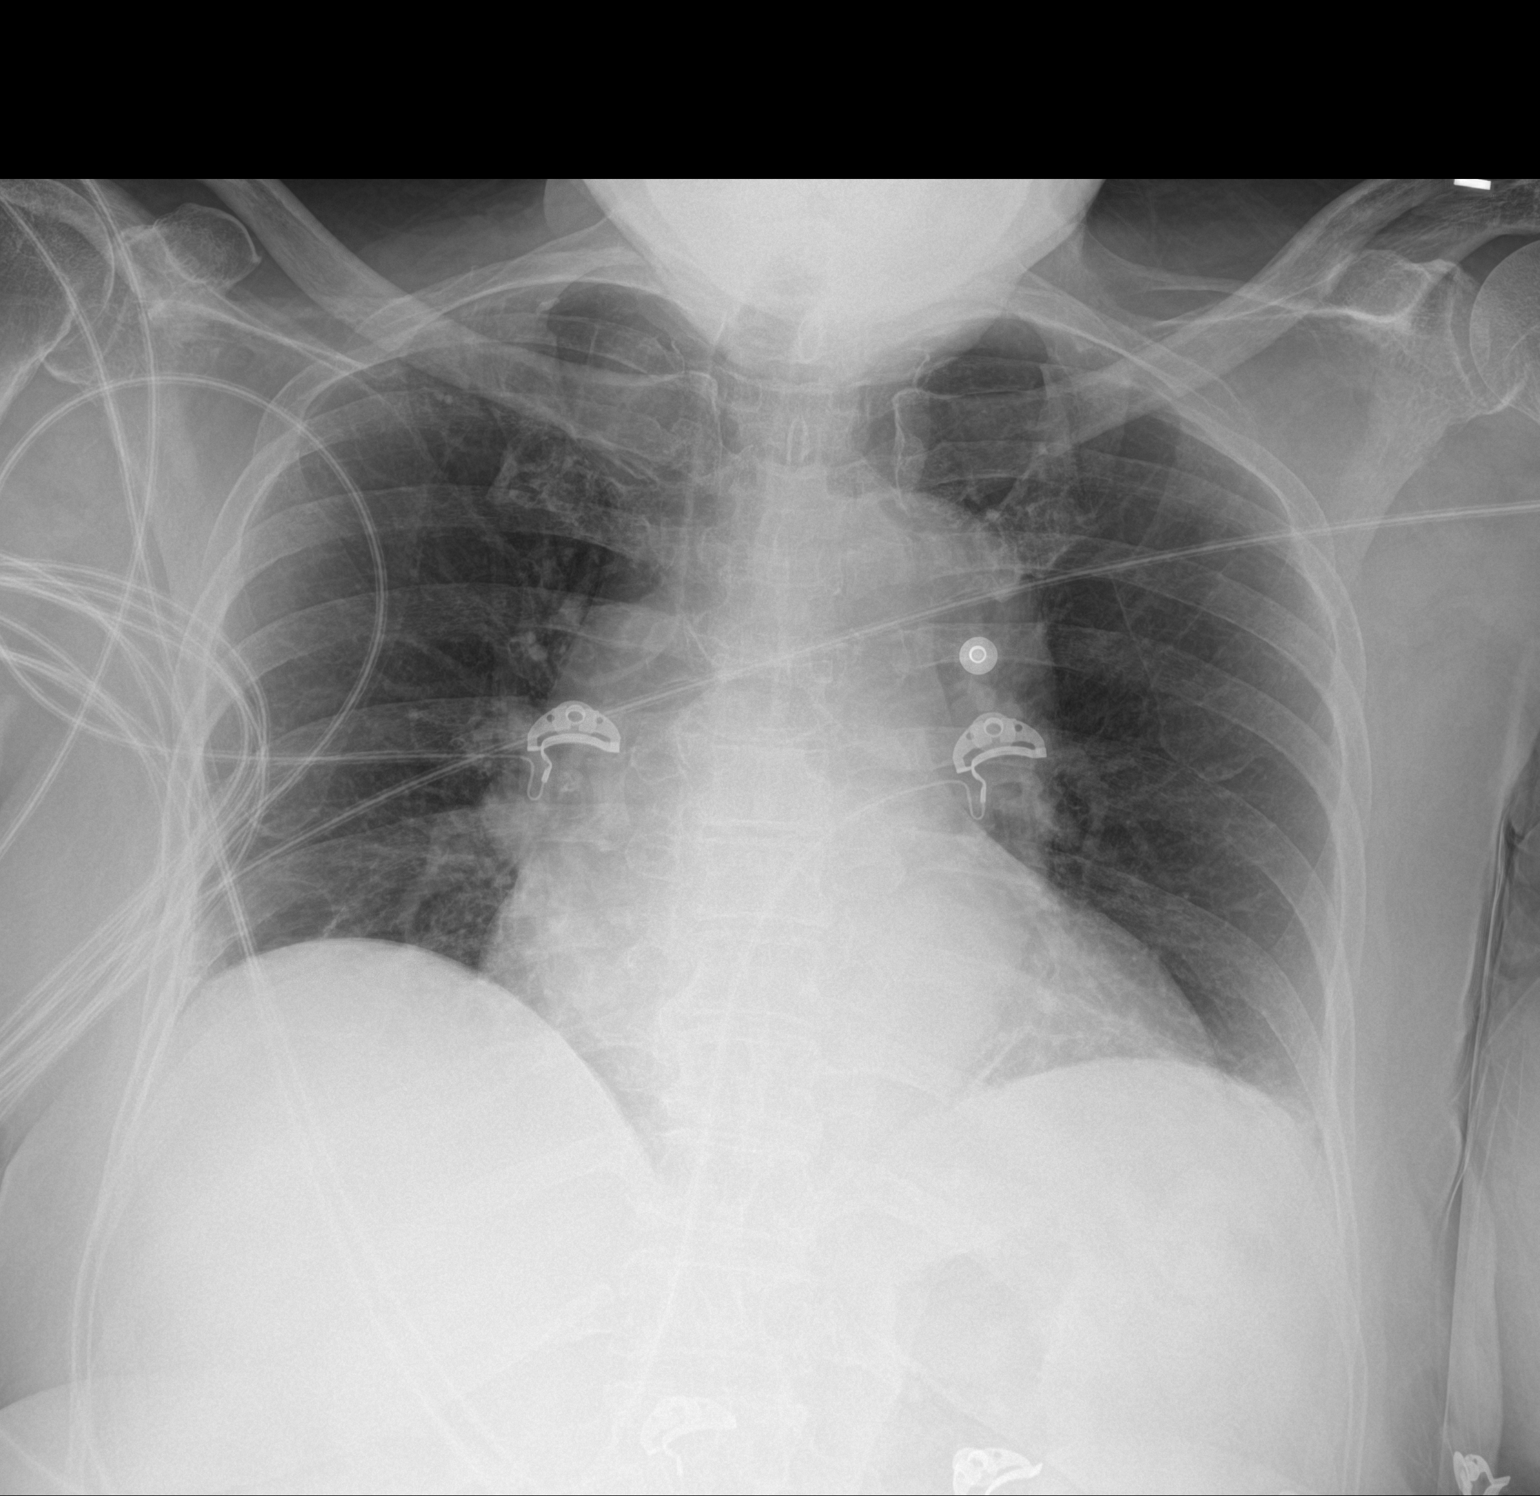

[1 of 1 positions shown; findings below may reference images not displayed]

FINDINGS: Chronic cardiomegaly. Aortic tortuosity. The lungs are clear. No
infiltrate, collapse or effusion. No pulmonary edema.
IMPRESSION: Chronic cardiomegaly.  Aortic tortuosity.  No active disease.

## 2022-10-18 IMAGING — CT CT HEAD CODE STROKE
3 of 4 series · 15 of 47 positions shown, 18 images · non-contrast
Comparison: 01/21/2021

CLINICAL DATA: Code stroke.

EXAM:
CT HEAD WITHOUT CONTRAST
TECHNIQUE: Contiguous axial images were obtained from the base of the skull
through the vertex without intravenous contrast.

[Series 4: head 5.0 st · axial · 0.39mm/px · z∈[-204,-54]mm · 9 of 34 slices shown, 12 images]
[im 2/34  brain]
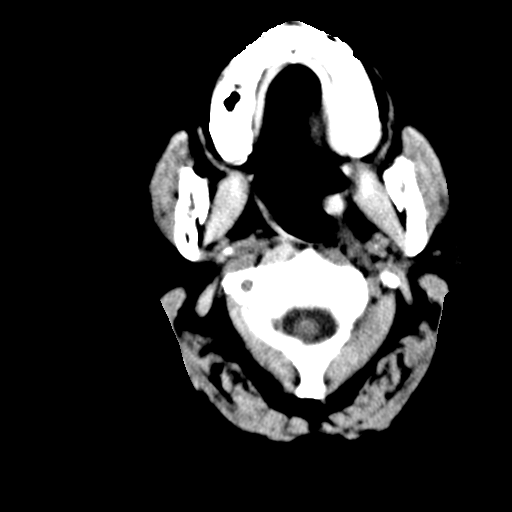
[im 2/34  bone]
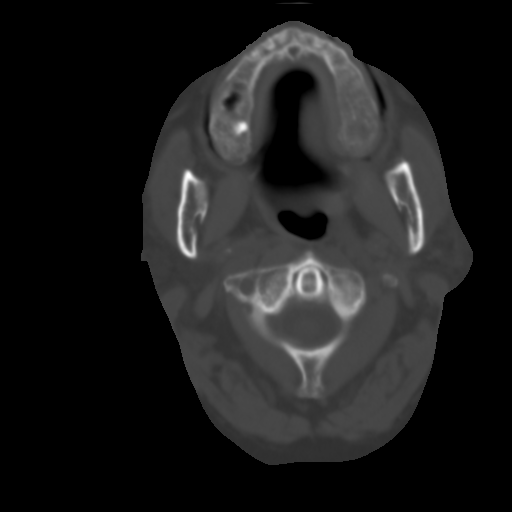
[im 6/34  brain]
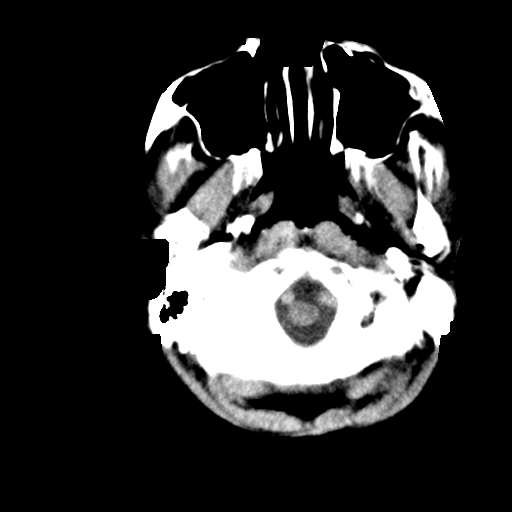
[im 10/34  brain]
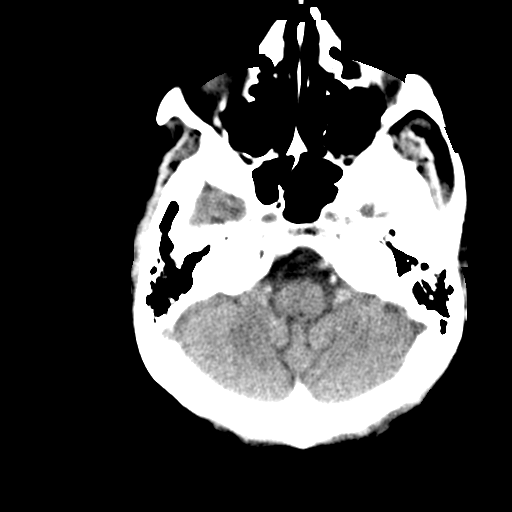
[im 14/34  brain]
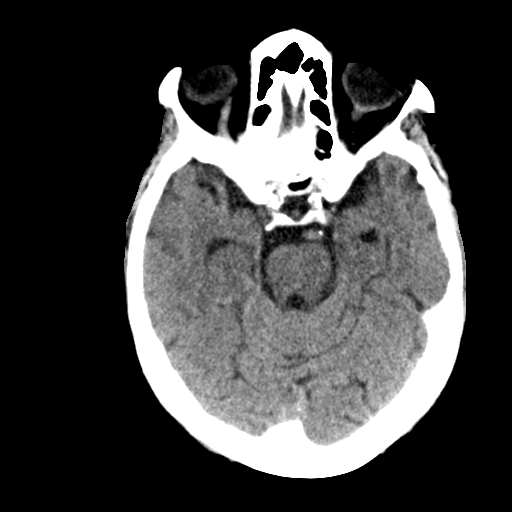
[im 18/34  brain]
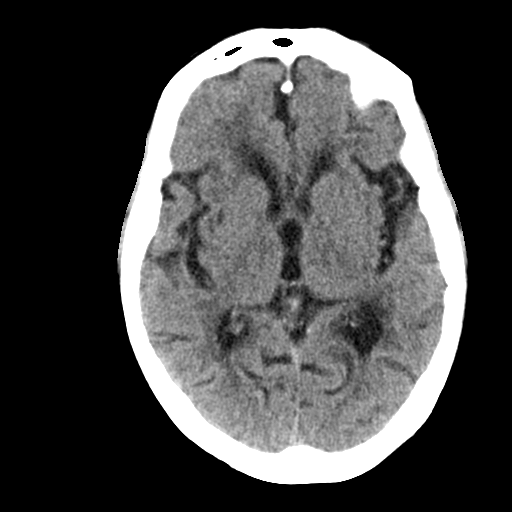
[im 18/34  bone]
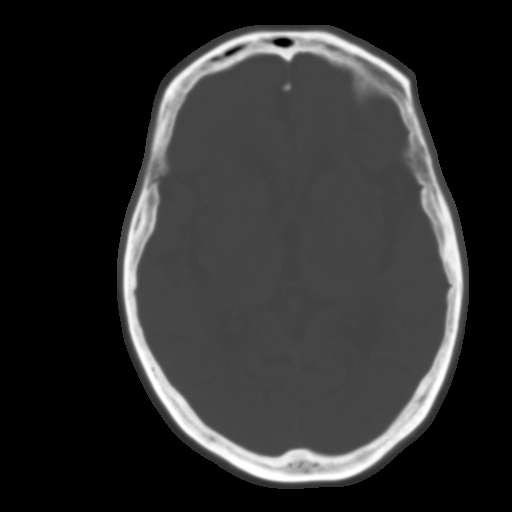
[im 20/34  brain]
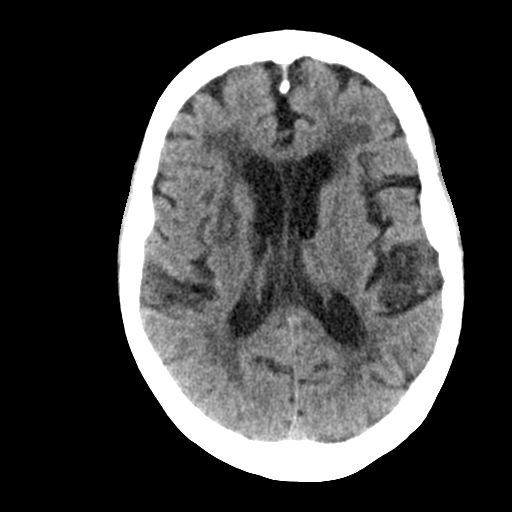
[im 24/34  brain]
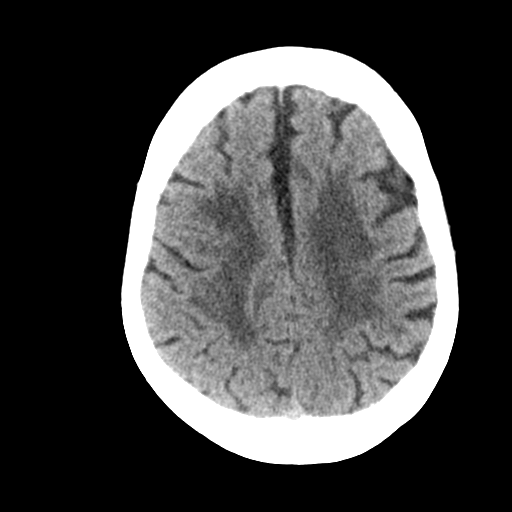
[im 28/34  brain]
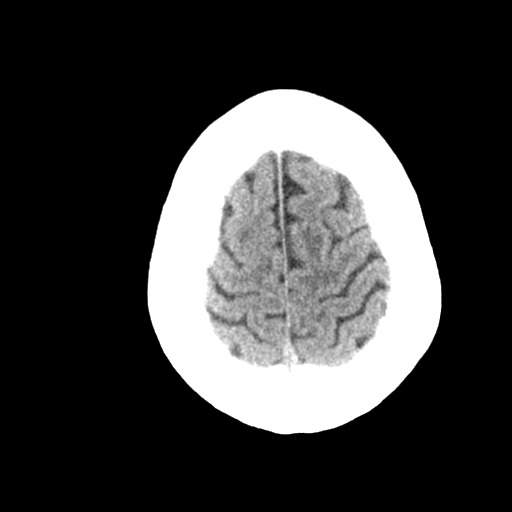
[im 32/34  brain]
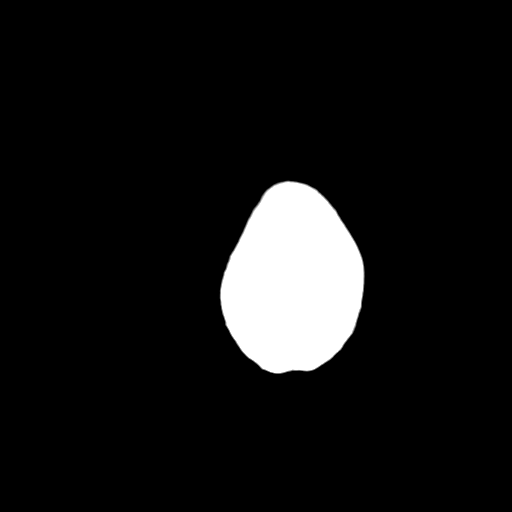
[im 32/34  bone]
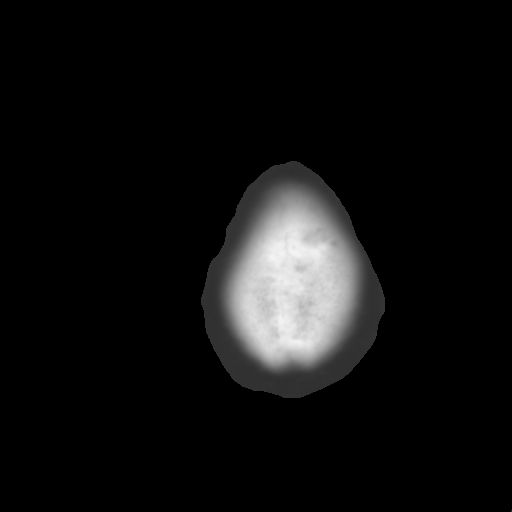

[Series 6: head 3.0 cor st · coronal · 0.34mm/px · 3 of 67 slices shown]
[im 23/67  brain]
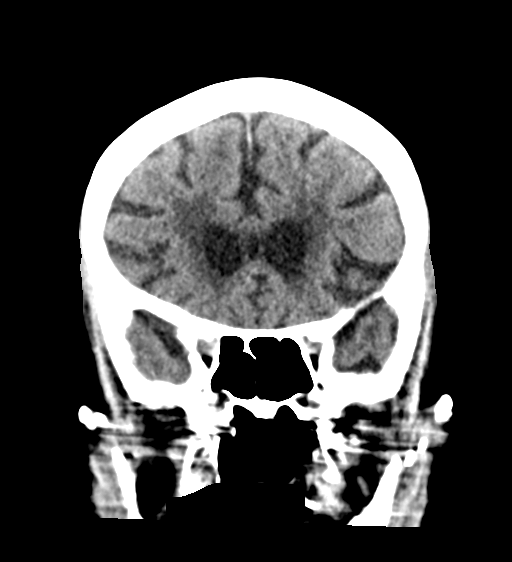
[im 30/67  brain]
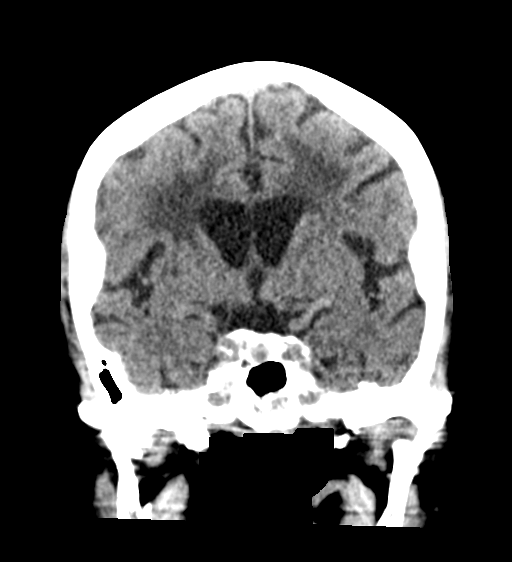
[im 37/67  brain]
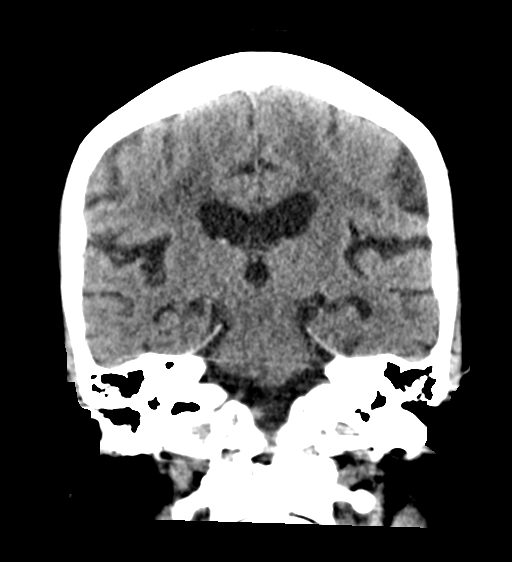

[Series 7: head 3.0 sag st · sagittal · 0.37mm/px · 3 of 67 slices shown]
[im 23/67  brain]
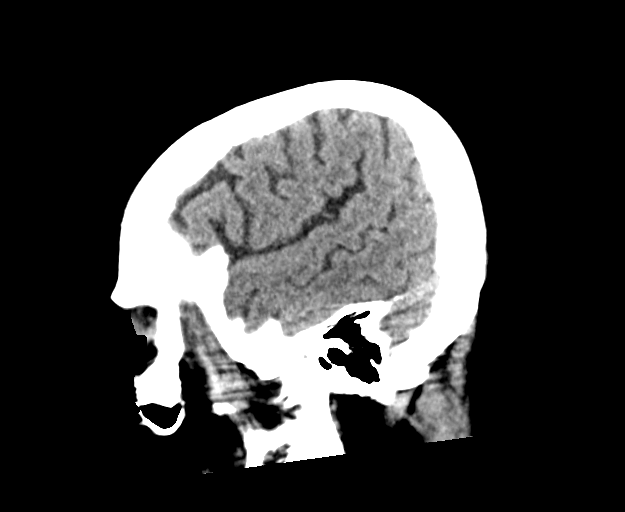
[im 34/67  brain]
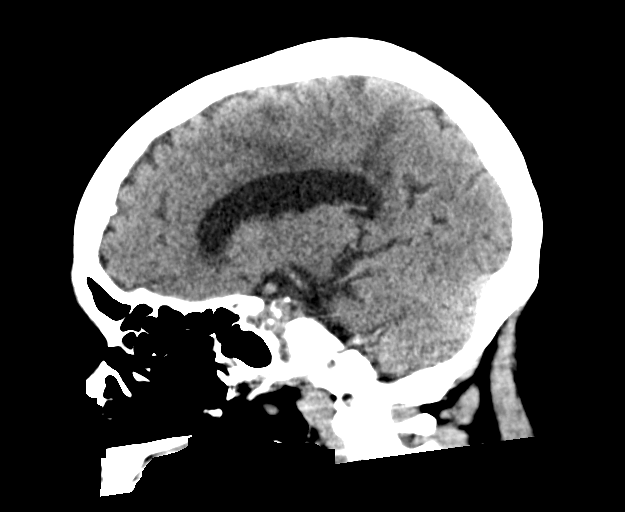
[im 45/67  brain]
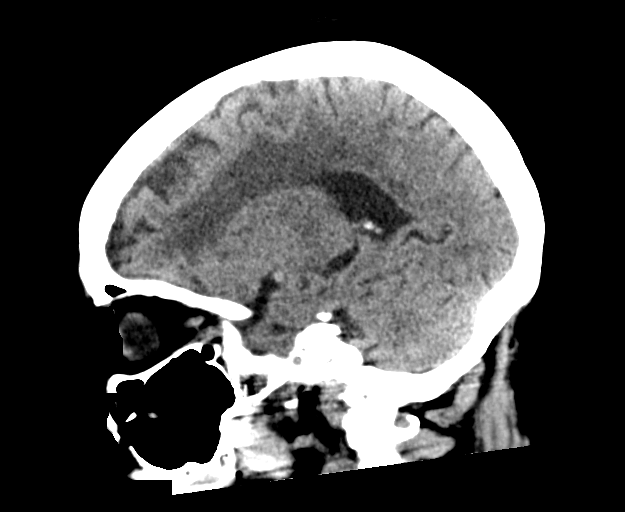

[15 of 47 positions shown; findings below may reference images not displayed]

FINDINGS: Brain: No acute intracranial hemorrhage, mass effect, or edema. No
new loss of gray-white differentiation. Chronic infarct of the right
basal ganglia and adjacent white matter. Fluent areas of
hypoattenuation in the supratentorial white matter likely reflects
stable chronic microvascular ischemic changes. Ventricles and sulci
are stable in size and configuration. No extra-axial collection.

Vascular: No hyperdense vessel. There is intracranial
atherosclerotic calcification at the skull base.

Skull: Unremarkable.

Sinuses/Orbits: New mildly hyperdense intraconal tubular structure
of the inferolateral left orbit (best seen on coronal series 6,
image 19).

Other: Mastoid air cells are clear.

ASPECTS (Alberta Stroke Program Early CT Score)

- Ganglionic level infarction (caudate, lentiform nuclei, internal
capsule, insula, M1-M3 cortex): 7

- Supraganglionic infarction (M4-M6 cortex): 3

Total score (0-10 with 10 being normal): 10
IMPRESSION: There is no acute intracranial hemorrhage or evidence of acute
infarction. ASPECT score is 10.

New mildly hyperdense intraconal tubular structure of the
inferolateral left orbit. Likely reflects a venous varix.

Stable chronic/nonemergent findings detailed above.

These results were communicated to Dr. Aus at [DATE] on
03/19/2021 by text page via the [employer] messaging system.

## 2022-10-19 IMAGING — MR MR HEAD W/O CM
12 of 13 series · 44 of 48 positions shown · non-contrast
Comparison: Prior CT from 03/19/2021

CLINICAL DATA: Initial evaluation for neuro deficit, stroke
suspected.

EXAM:
MRI HEAD WITHOUT CONTRAST
TECHNIQUE: Multiplanar, multiecho pulse sequences of the brain and surrounding
structures were obtained without intravenous contrast.

[Series 5: DWI · axial · 3.0mm · 0.88mm/px · z∈[-107,+33]mm · 8 of 96 slices shown (1 of 4)]
[im 1/96]
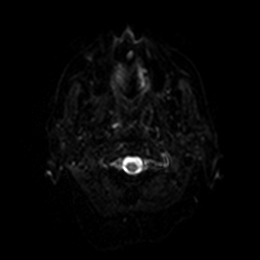
[im 14/96]
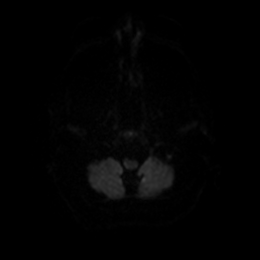
[im 28/96]
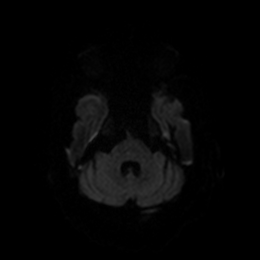
[im 41/96]
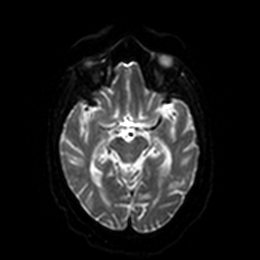
[im 55/96]
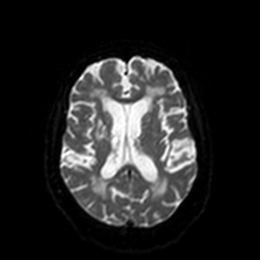
[im 68/96]
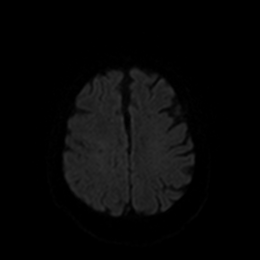
[im 82/96]
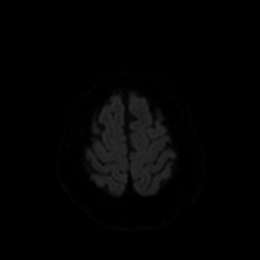
[im 96/96]
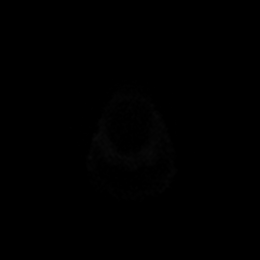

[Series 6: DWI · axial · 3.0mm · 0.88mm/px · z∈[-107,+33]mm · 4 of 48 slices shown (2 of 4)]
[im 1/48]
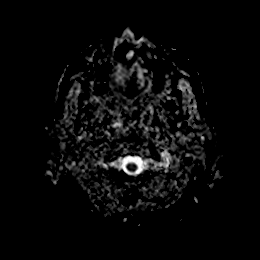
[im 16/48]
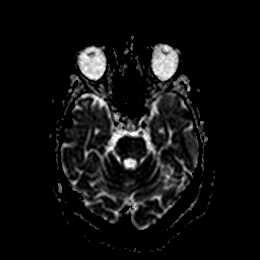
[im 32/48]
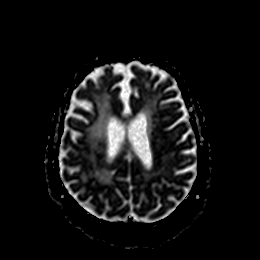
[im 48/48]
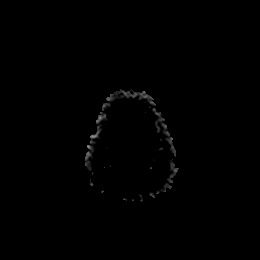

[Series 7: DWI · coronal · 4.0mm · 0.88mm/px · 5 of 64 slices shown (3 of 4)]
[im 1/64]
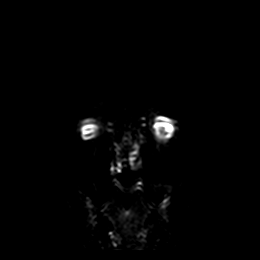
[im 16/64]
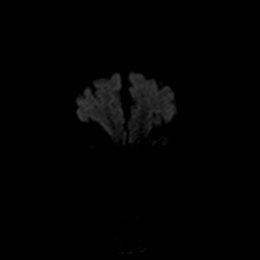
[im 32/64]
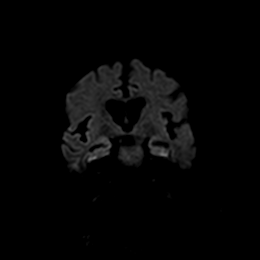
[im 48/64]
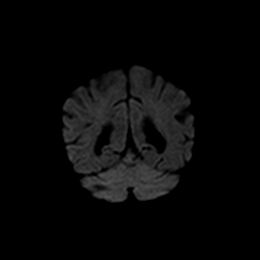
[im 64/64]
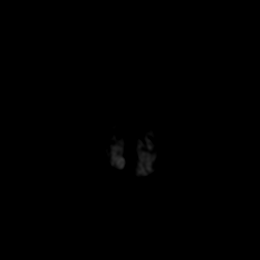

[Series 8: DWI · coronal · 4.0mm · 0.88mm/px · 3 of 32 slices shown (4 of 4)]
[im 1/32]
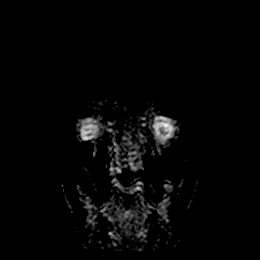
[im 16/32]
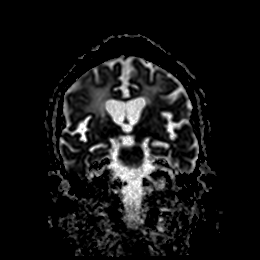
[im 32/32]
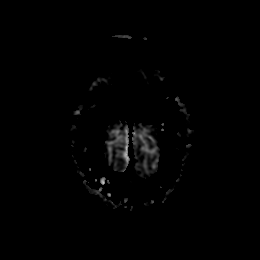

[Series 9: T1 · sagittal · 5.0mm · 0.75mm/px · 2 of 23 slices shown]
[im 1/23]
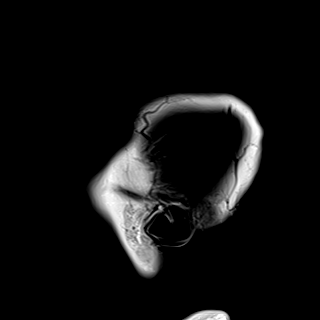
[im 23/23]
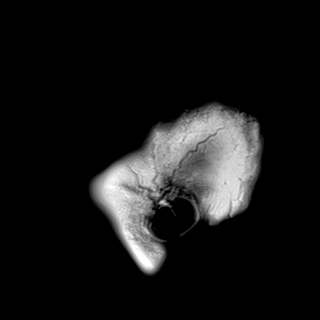

[Series 10: T2 · axial · 5.0mm · 0.72mm/px · z∈[-108,+35]mm · 2 of 25 slices shown (1 of 2)]
[im 1/25]
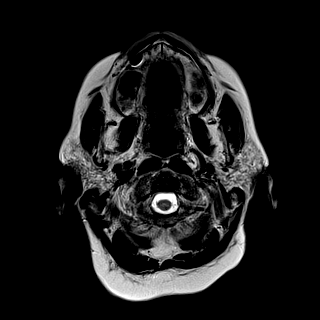
[im 25/25]
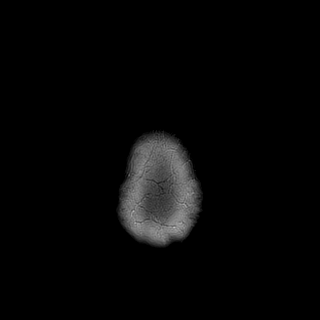

[Series 11: FLAIR · axial · 5.0mm · 0.45mm/px · z∈[-107,+36]mm · 2 of 25 slices shown]
[im 1/25]
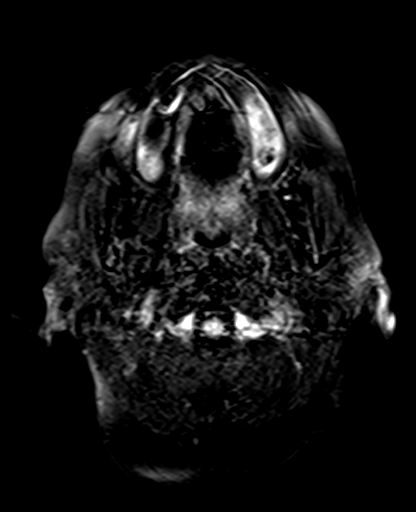
[im 25/25]
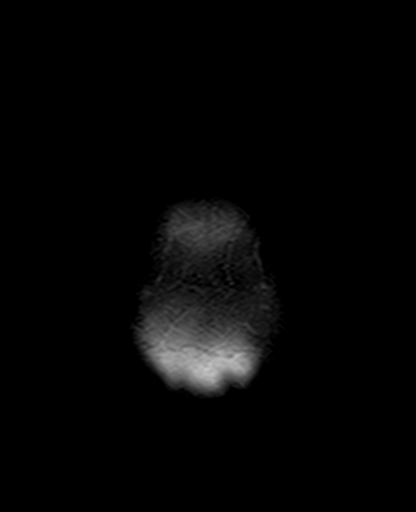

[Series 12: mag_images · axial · 3.0mm · 0.90mm/px · z∈[-112,+41]mm · 4 of 52 slices shown]
[im 1/52]
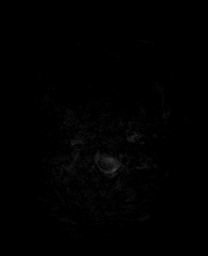
[im 18/52]
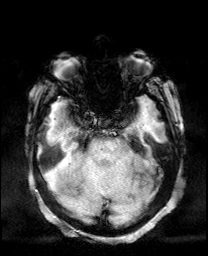
[im 35/52]
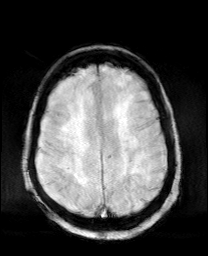
[im 52/52]
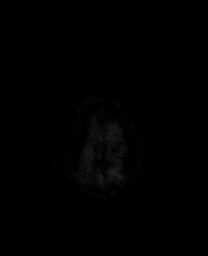

[Series 13: pha_images · axial · 3.0mm · 0.90mm/px · z∈[-109,+41]mm · 4 of 51 slices shown]
[im 1/51]
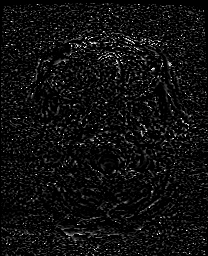
[im 17/51]
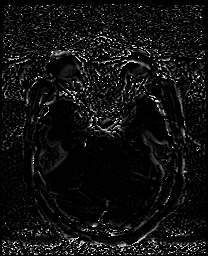
[im 34/51]
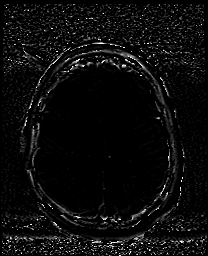
[im 51/51]
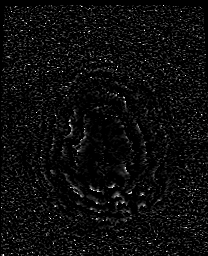

[Series 14: swi_images · axial · 3.0mm · 0.90mm/px · z∈[-112,+41]mm · 4 of 52 slices shown]
[im 1/52]
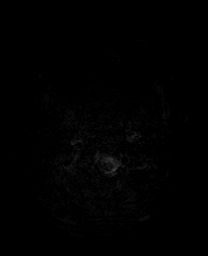
[im 18/52]
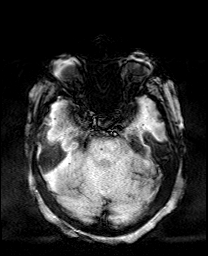
[im 35/52]
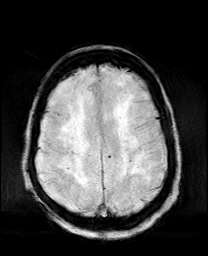
[im 52/52]
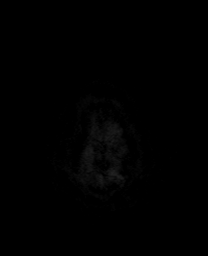

[Series 15: mip_images(sw) · axial · 24.0mm · 0.90mm/px · z∈[-101,+30]mm · 4 of 45 slices shown]
[im 1/45]
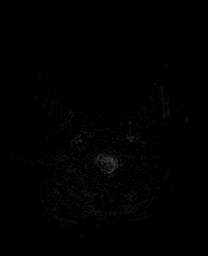
[im 15/45]
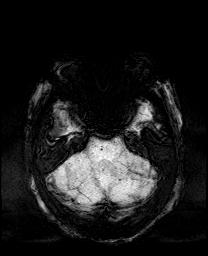
[im 30/45]
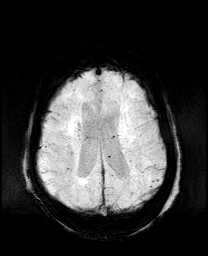
[im 45/45]
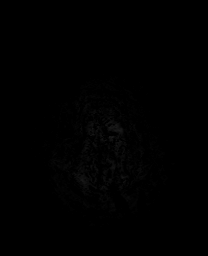

[Series 17: T2 · coronal · 5.0mm · 0.34mm/px · 2 of 29 slices shown (2 of 2)]
[im 1/29]
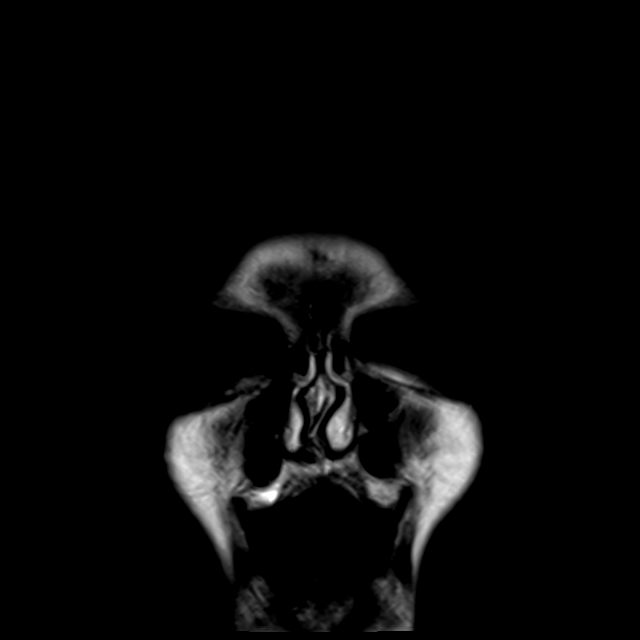
[im 29/29]
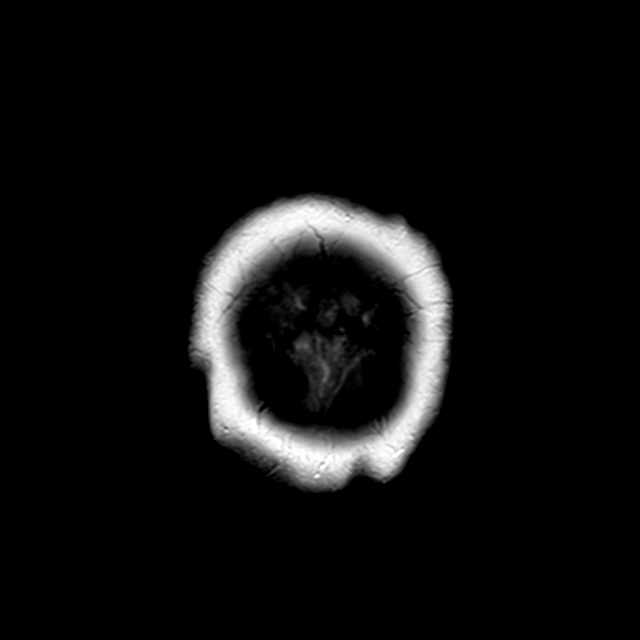

[44 of 48 positions shown; findings below may reference images not displayed]

FINDINGS: Brain: Examination mildly degraded by motion artifact.

Cerebral volume within normal limits for age. Extensive patchy and
confluent T2/FLAIR hyperintensity seen throughout the
periventricular and deep white matter both cerebral hemispheres,
most like related to advanced chronic microvascular ischemic
disease. Mild patchy involvement of the pons. Multiple superimposed
remote lacunar infarcts present about the right basal ganglia and
thalami. Few small remote left cerebellar infarcts.

No abnormal foci of restricted diffusion to suggest acute or
subacute ischemia. Gray-white matter differentiation maintained. No
other areas of remote cortical infarction. No acute intracranial
hemorrhage. Multiple chronic micro hemorrhages noted clustered about
the cerebellum, brainstem, and deep gray nuclei, likely related to
chronic poorly controlled hypertension.

No mass lesion, midline shift or mass effect. No hydrocephalus or
extra-axial fluid collection. Pituitary gland suprasellar region
normal.

Vascular: Major intracranial vascular flow voids are maintained.

Skull and upper cervical spine: Craniocervical junction within
normal limits. Bone marrow signal intensity normal. No scalp soft
tissue abnormality.

Sinuses/Orbits: Globes and orbital soft tissues within normal
limits. Mild scattered mucosal thickening noted about the ethmoidal
air cells. Paranasal sinuses are otherwise clear. No significant
mastoid effusion. Inner ear structures grossly normal.

Other: None.
IMPRESSION: 1. No acute intracranial abnormality.
2. Advanced chronic microvascular ischemic disease with multiple
remote lacunar infarcts involving the right basal ganglia, thalami,
and left cerebellum.
3. Multiple chronic micro hemorrhages clustered about the
cerebellum, brainstem, and deep gray nuclei, likely related to
chronic poorly controlled hypertension.

## 2022-11-15 IMAGING — DX DG CHEST 2V
2 series · 2 of 2 positions shown · non-contrast
Comparison: 03/19/2021

CLINICAL DATA: Cough and weakness

EXAM:
CHEST - 2 VIEW

[chest lat]
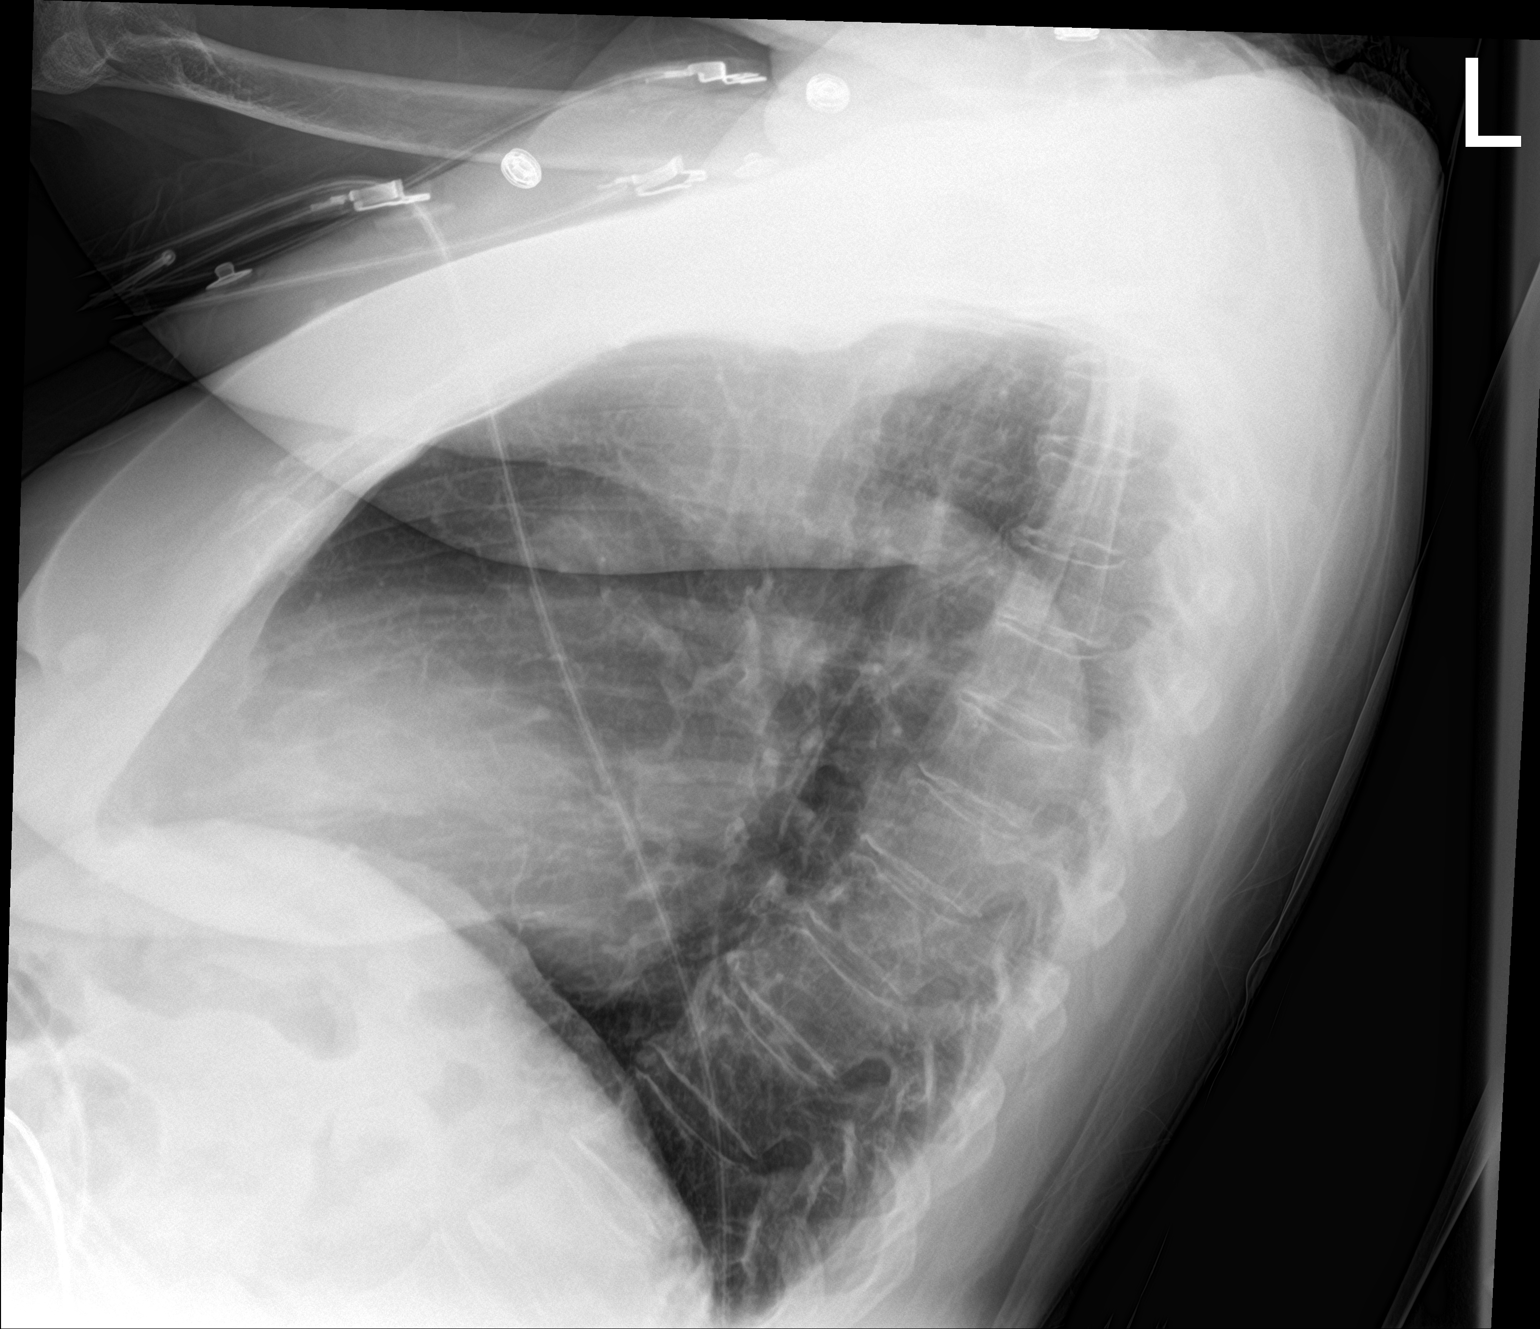

[chest ap]
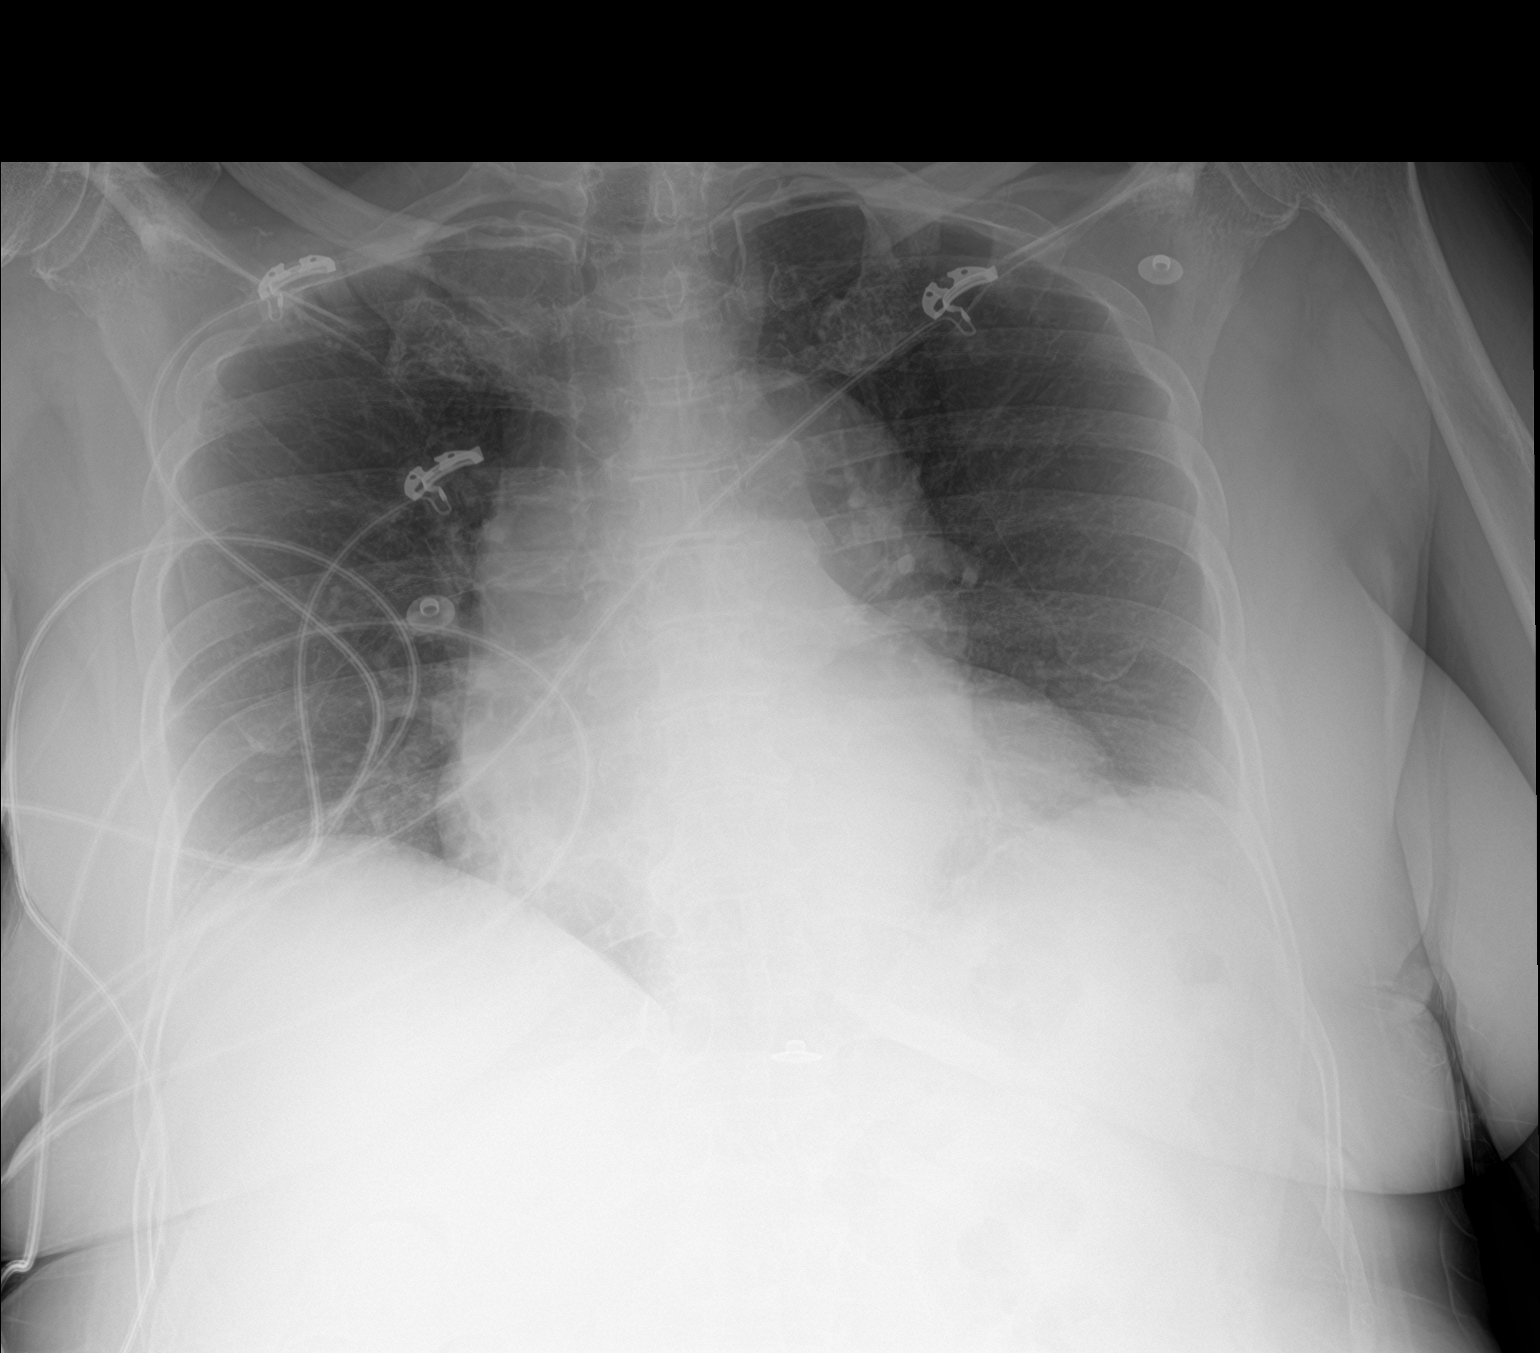

[2 of 2 positions shown; findings below may reference images not displayed]

FINDINGS: Decreased lung volumes with mild bibasilar atelectasis. Negative for
heart failure. No pleural effusion. Heart size mildly facet heart
size upper normal.
IMPRESSION: Hypoventilation with mild bibasilar atelectasis.

## 2023-01-17 IMAGING — CR DG HIP (WITH OR WITHOUT PELVIS) 2-3V*L*
3 series · 3 of 3 positions shown · non-contrast
Comparison: No priors.

CLINICAL DATA: 70-year-old female with history of left hip pain. No
history of trauma.

EXAM:
DG HIP (WITH OR WITHOUT PELVIS) 2-3V LEFT

[hip ap]
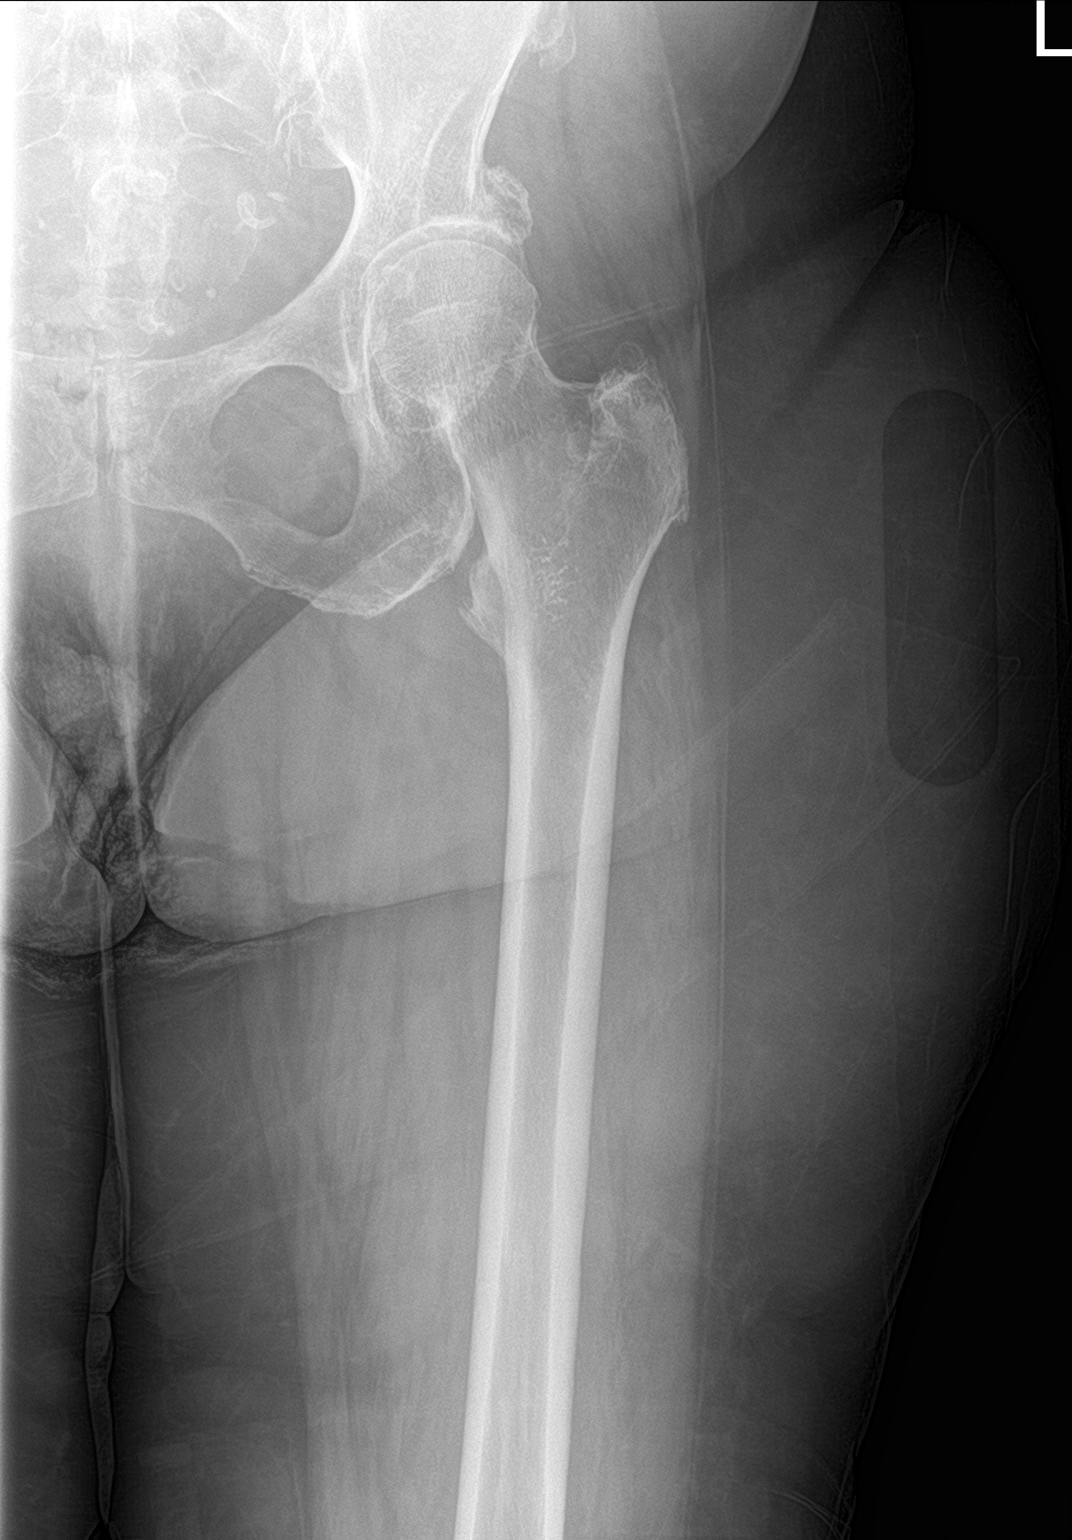

[hip x-table]
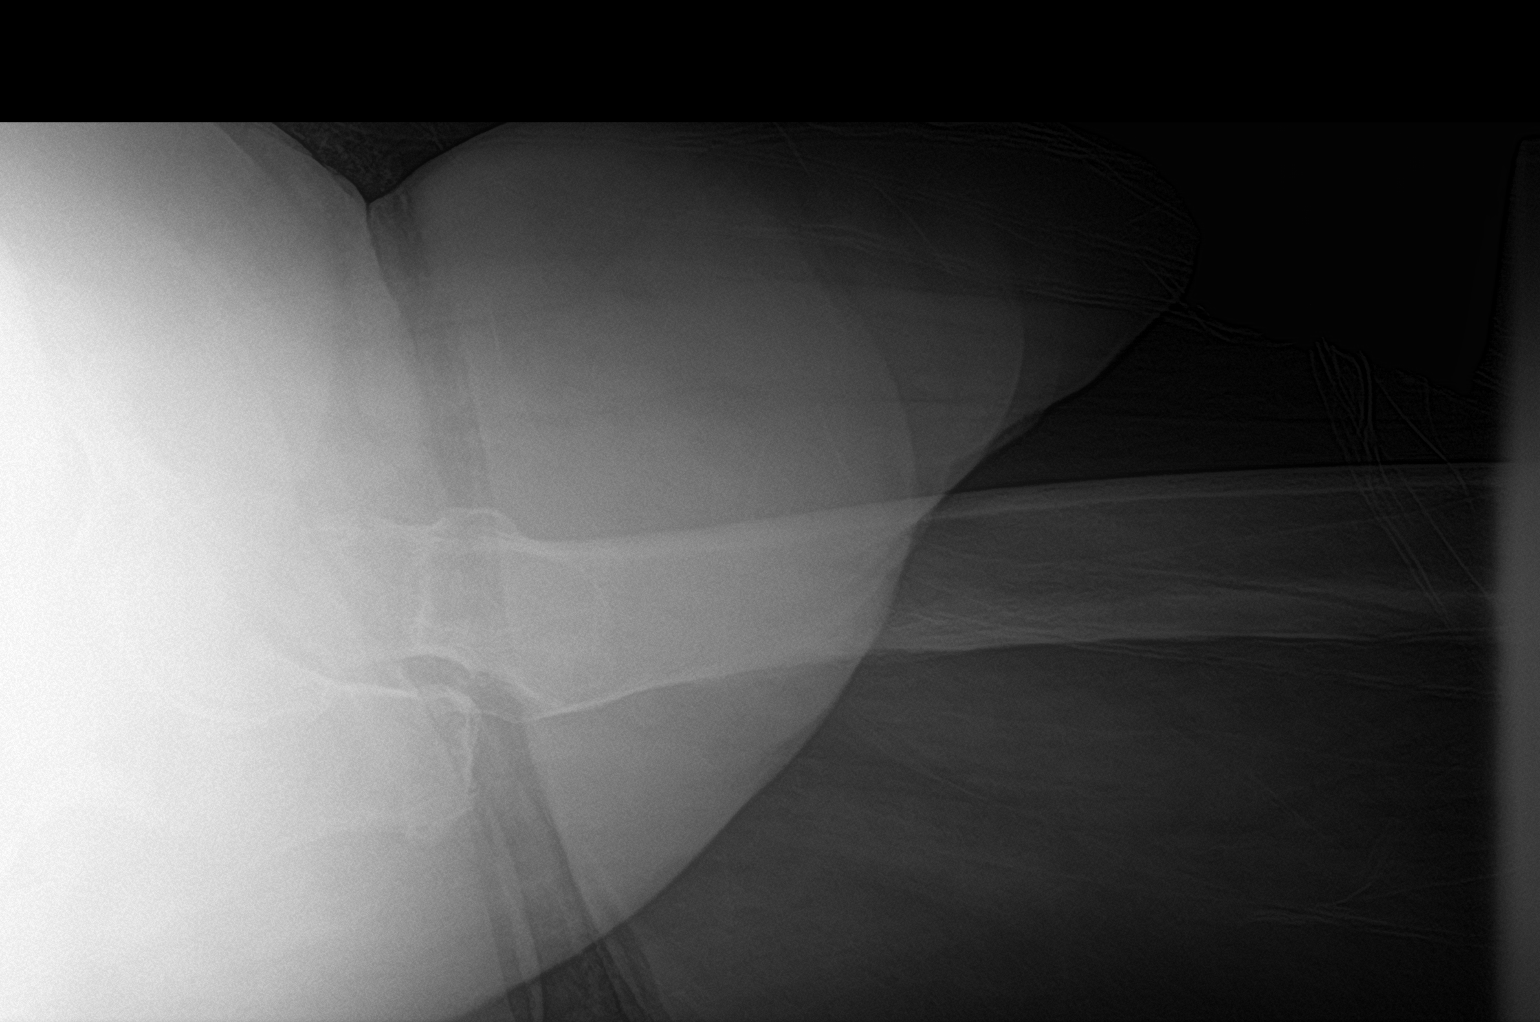

[pelvis ap]
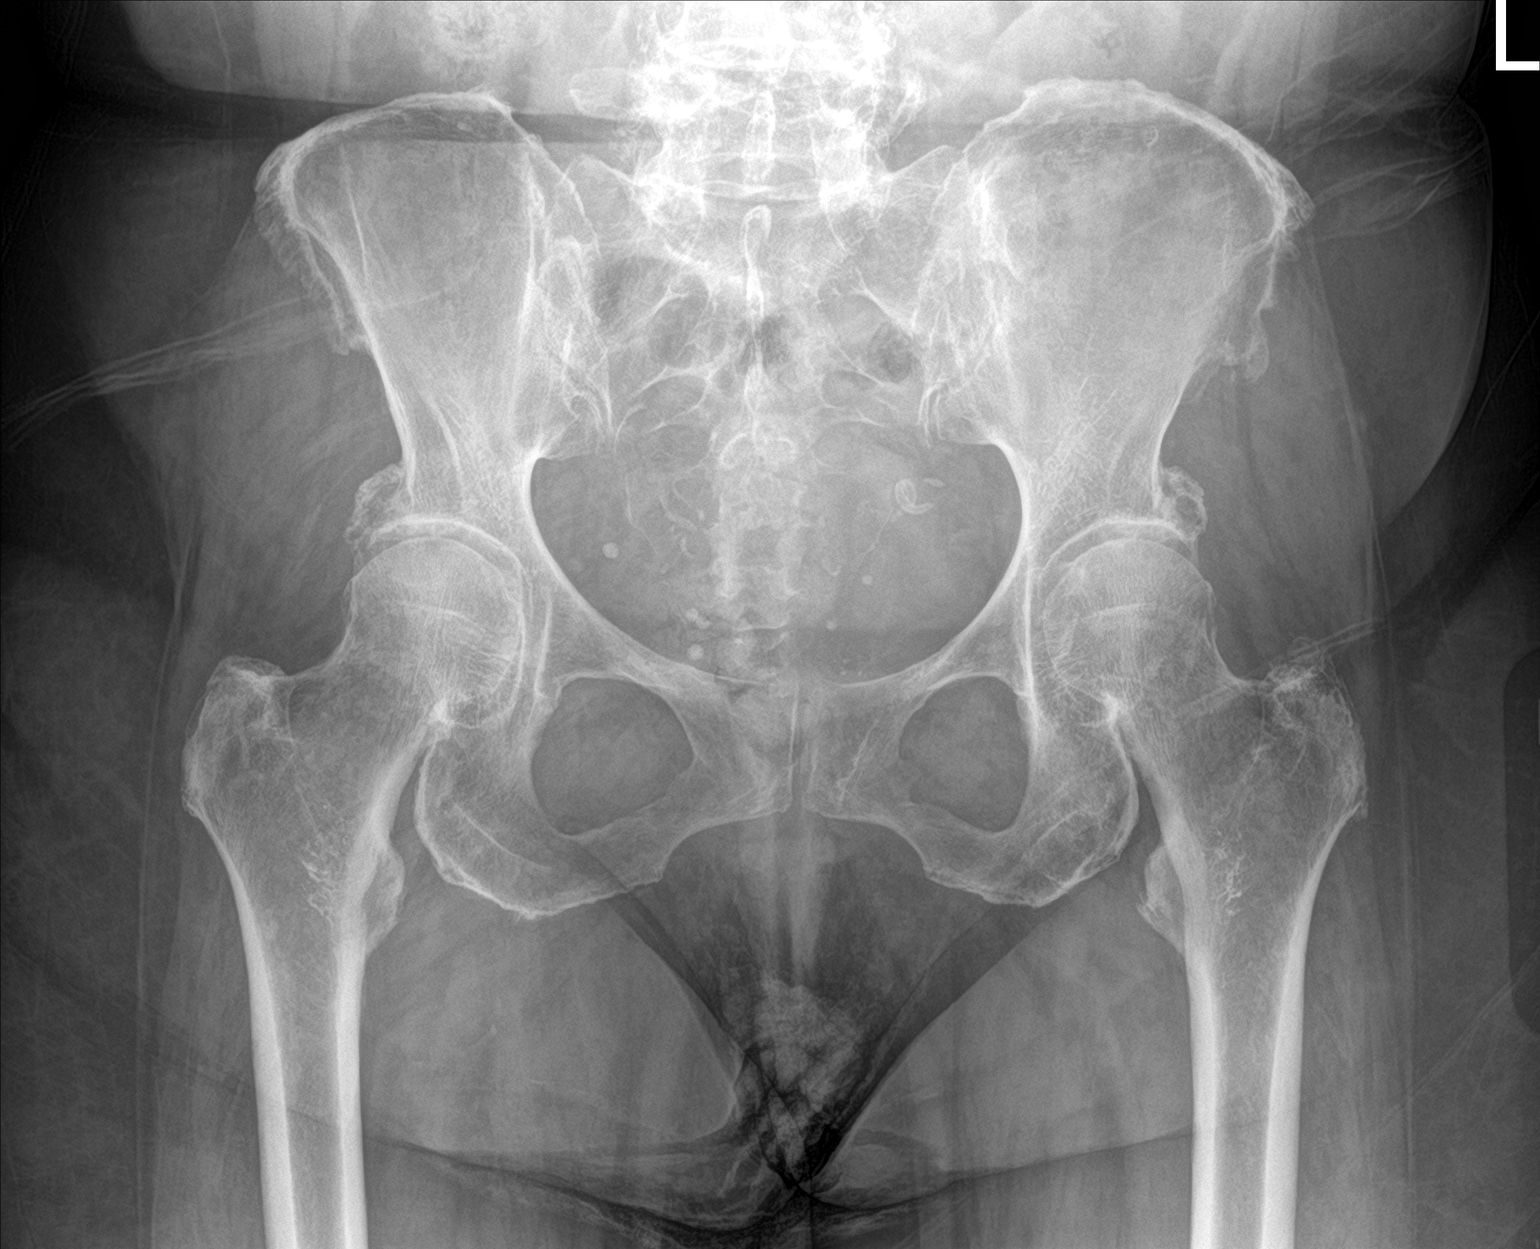

[3 of 3 positions shown; findings below may reference images not displayed]

FINDINGS: AP view of the bony pelvis and AP and lateral views of the left hip
demonstrate no definite acute displaced fracture of the bony pelvic
ring. Bilateral proximal femurs as visualized appear intact and the
left femoral head is properly located. There is joint space
narrowing, subchondral sclerosis, subchondral cyst formation and
osteophyte formation in the hip joints bilaterally, compatible with
moderate to severe osteoarthritis.
IMPRESSION: 1. No acute radiographic abnormality of the bony pelvis or the left
hip.
2. Moderate to severe bilateral hip joint osteoarthritis.

## 2023-01-17 IMAGING — MR MR LUMBAR SPINE W/O CM
4 of 5 series · 25 of 48 positions shown · non-contrast
Comparison: Lumbar spine MRI 02/17/2018, lumbar spine radiographs
01/26/2021, CT abdomen/pelvis 12/15/2020

CLINICAL DATA: Low back pain, now with urinary retention and pain
radiating to left groin

EXAM:
MRI LUMBAR SPINE WITHOUT CONTRAST
TECHNIQUE: Multiplanar, multisequence MR imaging of the lumbar spine was
performed. No intravenous contrast was administered.

[Series 5: T2 · sagittal · 4.0mm · 0.73mm/px · 6 of 16 slices shown (1 of 2)]
[im 1/16]
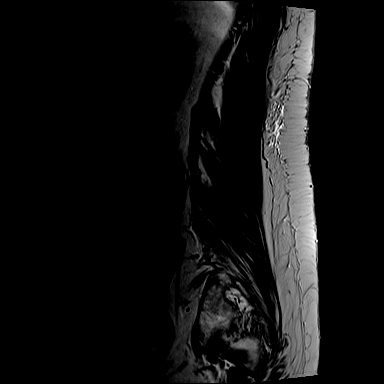
[im 4/16]
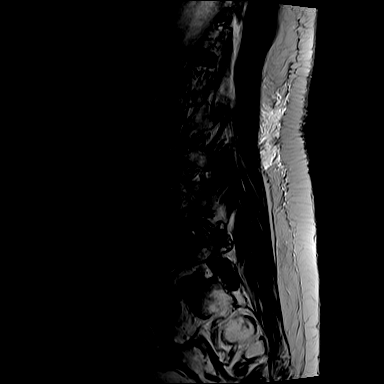
[im 7/16]
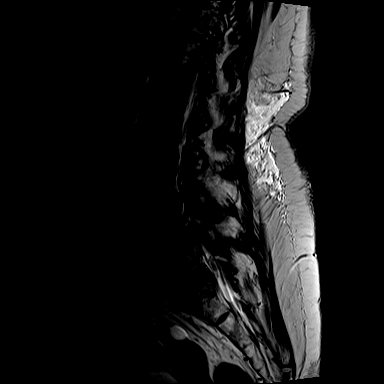
[im 10/16]
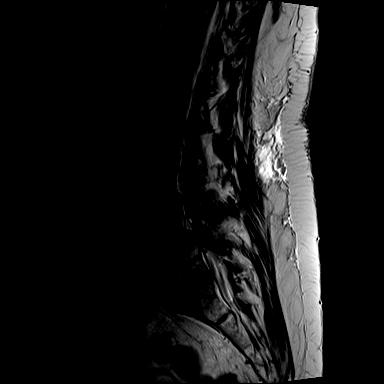
[im 13/16]
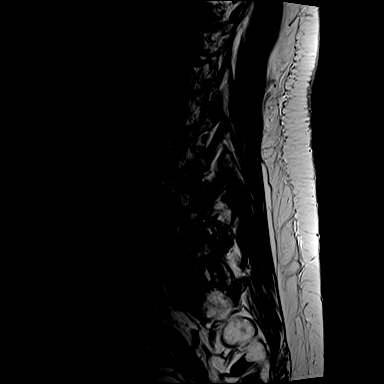
[im 16/16]
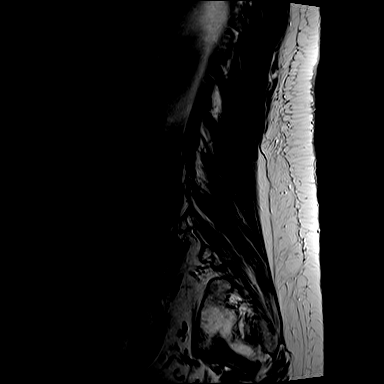

[Series 7: T1 · sagittal · 4.0mm · 0.88mm/px · 7 of 16 slices shown (1 of 2)]
[im 1/16]
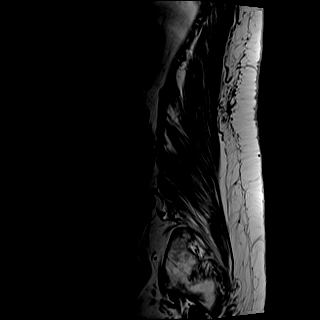
[im 3/16]
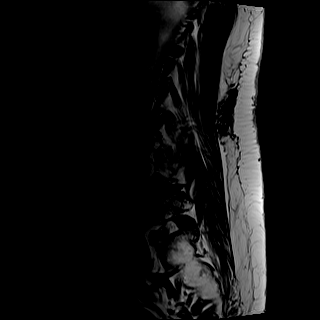
[im 6/16]
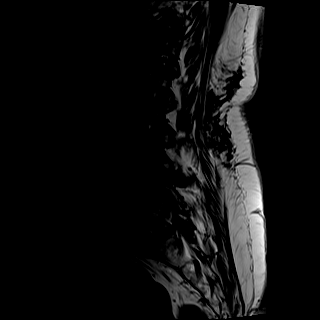
[im 8/16]
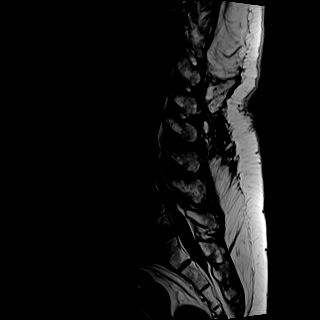
[im 11/16]
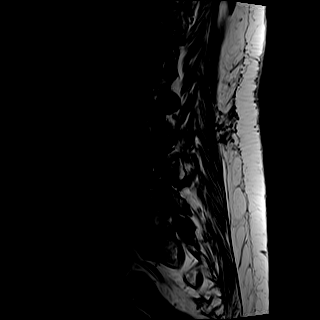
[im 13/16]
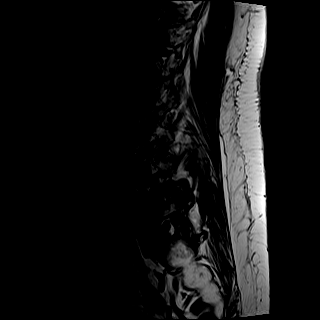
[im 16/16]
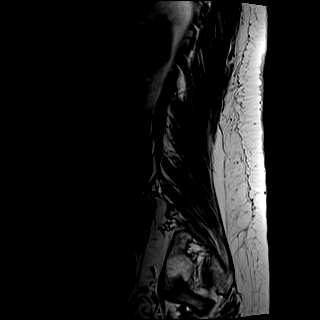

[Series 8: T2 · axial · 4.0mm · 0.57mm/px · z∈[-102,+78]mm · 8 of 31 slices shown (2 of 2)]
[im 1/31]
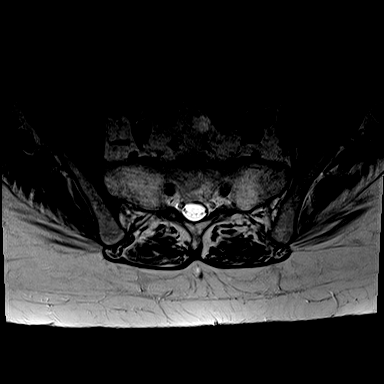
[im 5/31]
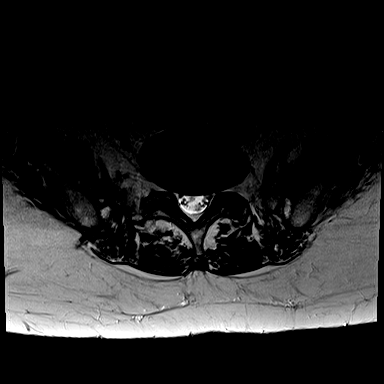
[im 10/31]
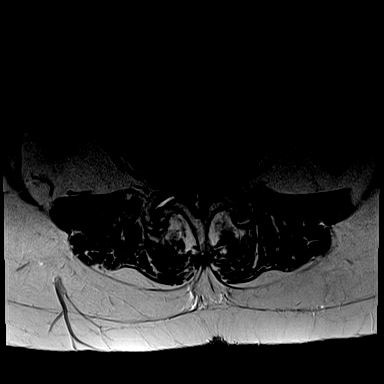
[im 14/31]
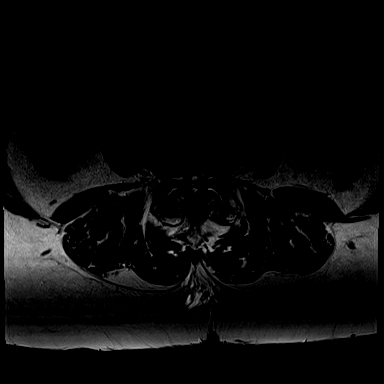
[im 17/31]
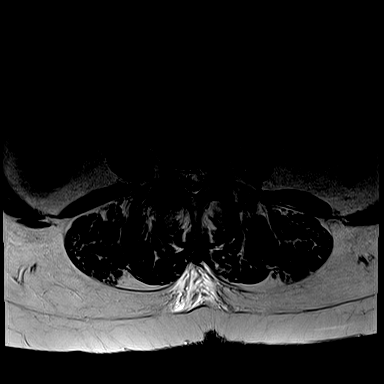
[im 21/31]
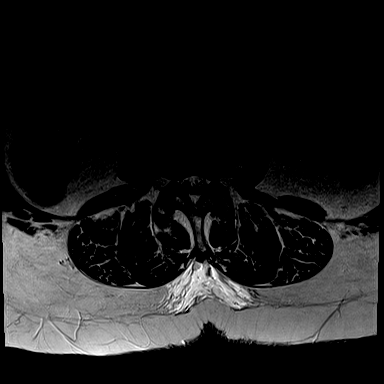
[im 26/31]
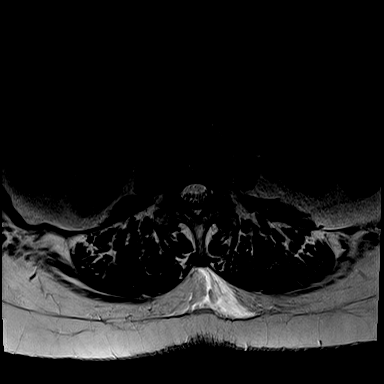
[im 31/31]
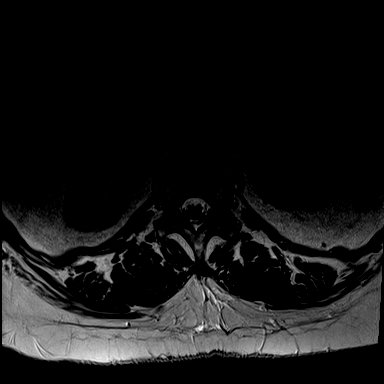

[Series 9: T1 · axial · 4.0mm · 0.34mm/px · z∈[-102,+53]mm · 4 of 31 slices shown (2 of 2)]
[im 1/31]
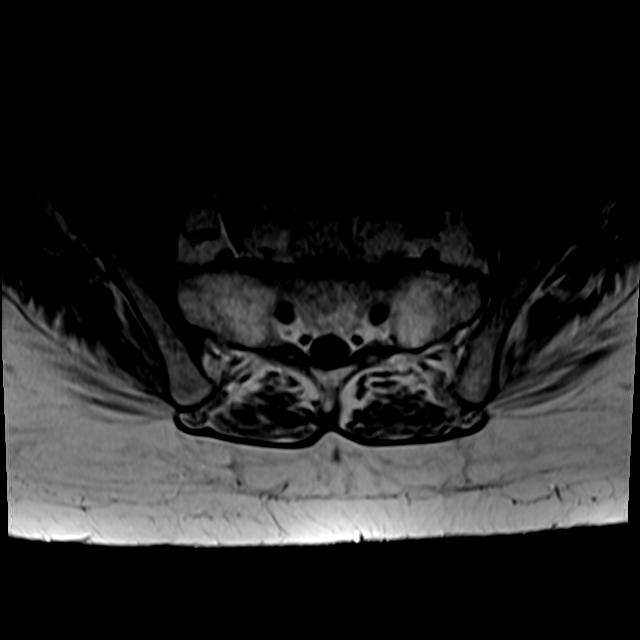
[im 5/31]
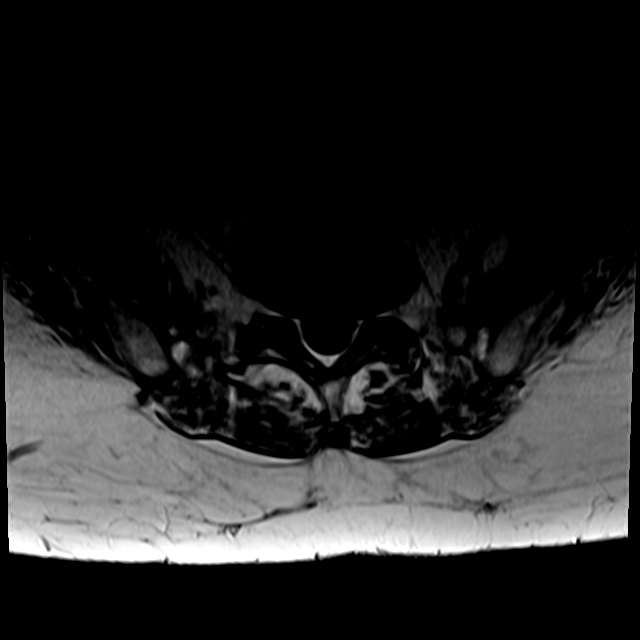
[im 17/31]
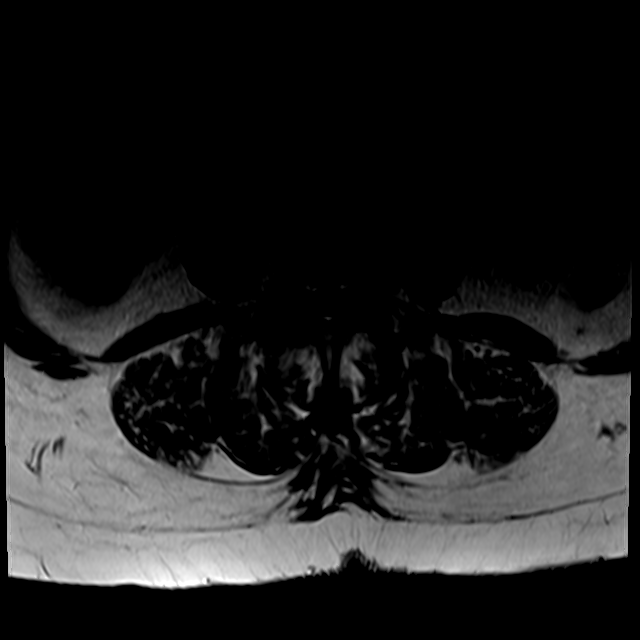
[im 26/31]
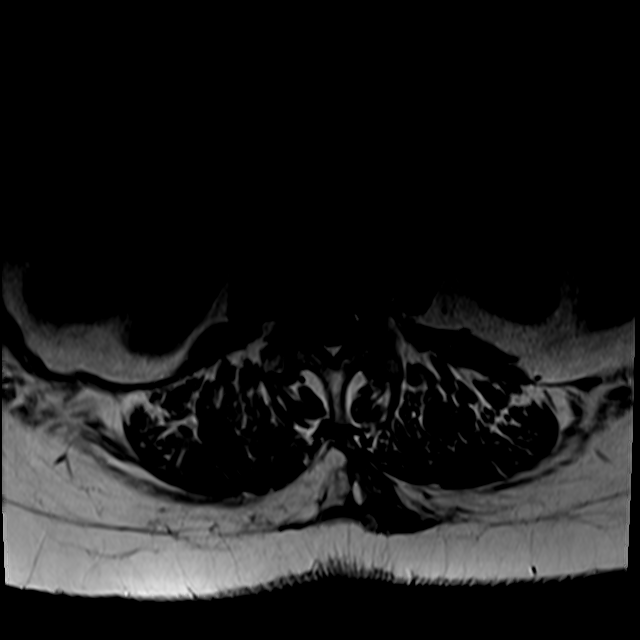

[25 of 48 positions shown; findings below may reference images not displayed]

FINDINGS: Segmentation:  Standard.

Alignment: There is 4 mm anterolisthesis of L4 on L5, unchanged
compared to the CT abdomen/pelvis of 02/12/2021 and not
significantly changed compared to prior MRI from 5227. Alignment at
the remaining levels is normal.

Vertebrae: There is compression deformity of the L5 vertebral body
with up to approximately 40% loss of vertebral body height
centrally, unchanged compared to the CT of 12/15/2020 but new
compared to the prior MRI. There is mild bony retropulsion,
unchanged. Vertebral body heights at the remaining levels is
preserved. Marrow signal is unremarkable.

Conus medullaris and cauda equina: Conus extends to the L1-L2 level.
Conus and cauda equina appear normal.

Paraspinal and other soft tissues: The paraspinal soft tissues are
unremarkable. A right renal cyst is noted. Probable fibroid uterus.

Disc levels:

There is disc desiccation without significant loss of height at
L3-L4 through L5-S1, overall similar to the prior MRI. The remaining
disc spaces are preserved. There is multilevel facet arthropathy,
most advanced at L3-L4 and L4-L5. There are facet joint effusions on
the left at L3-L4 and on the right at L4-L5 with associated
perifacetal soft tissue edema, most prominent on the right at L4-L5.

There is congenital stenosis of the lumbar canal be low the L2-L3
level with superimposed degenerative changes below.

T12-L1: No significant spinal canal or neural foraminal stenosis.

L1-L2: No significant spinal canal or neural foraminal stenosis.

L2-L3: There is a minimal disc bulge and mild bilateral facet
arthropathy resulting in mild spinal canal stenosis and mild
bilateral neural foraminal stenosis.

L3-L4: There is a mild disc bulge, ligamentum flavum thickening, and
bilateral facet arthropathy superimposed on congenital canal
stenosis resulting in moderate spinal canal stenosis and moderate
left worse than right neural foraminal stenosis. Findings at this
level have slightly worsened since 6447.

L4-L5: There is uncovering of the disc posteriorly with a
superimposed diffuse bulge and slight superior migration of disc
material centrally, ligamentum flavum thickening, and advanced facet
arthropathy resulting in severe spinal canal stenosis with
significant crowding of the cauda equina nerve roots and narrowing
of the subarticular zones and severe left worse than right neural
foraminal stenosis. Foraminal and extraforaminal disc material on
the left impinges the exiting L4 nerve root within the foramen and
likely displaces the extraforaminal nerve root. Findings at this
level have worsened since 6447.

L5-S1: There is a mild disc bulge and bilateral facet arthropathy
resulting in mild bilateral neural foraminal stenosis without spinal
canal stenosis, not significantly changed since 6447.
IMPRESSION: 1. Compression deformity of the L5 vertebral body with mild bony
retropulsion and grade 1 anterolisthesis of L4 on L5 are not
significantly changed since 12/15/2020. Superimposed degenerative
changes at this level described above result in severe spinal canal
stenosis with significant crowding of the cauda equina nerve roots
and severe left worse than right neural foraminal stenosis with
impingement of the left L4 nerve root, progressed since [DATE]. Moderate spinal canal stenosis and moderate left worse than right
neural foraminal stenosis at L3-L4 due to above described
degenerative changes superimposed on congenital canal narrowing,
slightly progressed since [DATE]. Bilateral facet arthropathy most advanced at L3-L4 and L4-L5 with
associated facet joint effusions and perifacetal soft tissue edema,
most prominent on the right at L4-L5.
4. Congenital narrowing of the lumbar canal below L2-L3.

## 2023-01-26 IMAGING — DX DG CHEST 1V PORT
2 series · 2 of 2 positions shown · non-contrast
Comparison: None.

CLINICAL DATA: Fall, dizziness

EXAM:
PORTABLE CHEST 1 VIEW

[chest ap (1 of 2)]
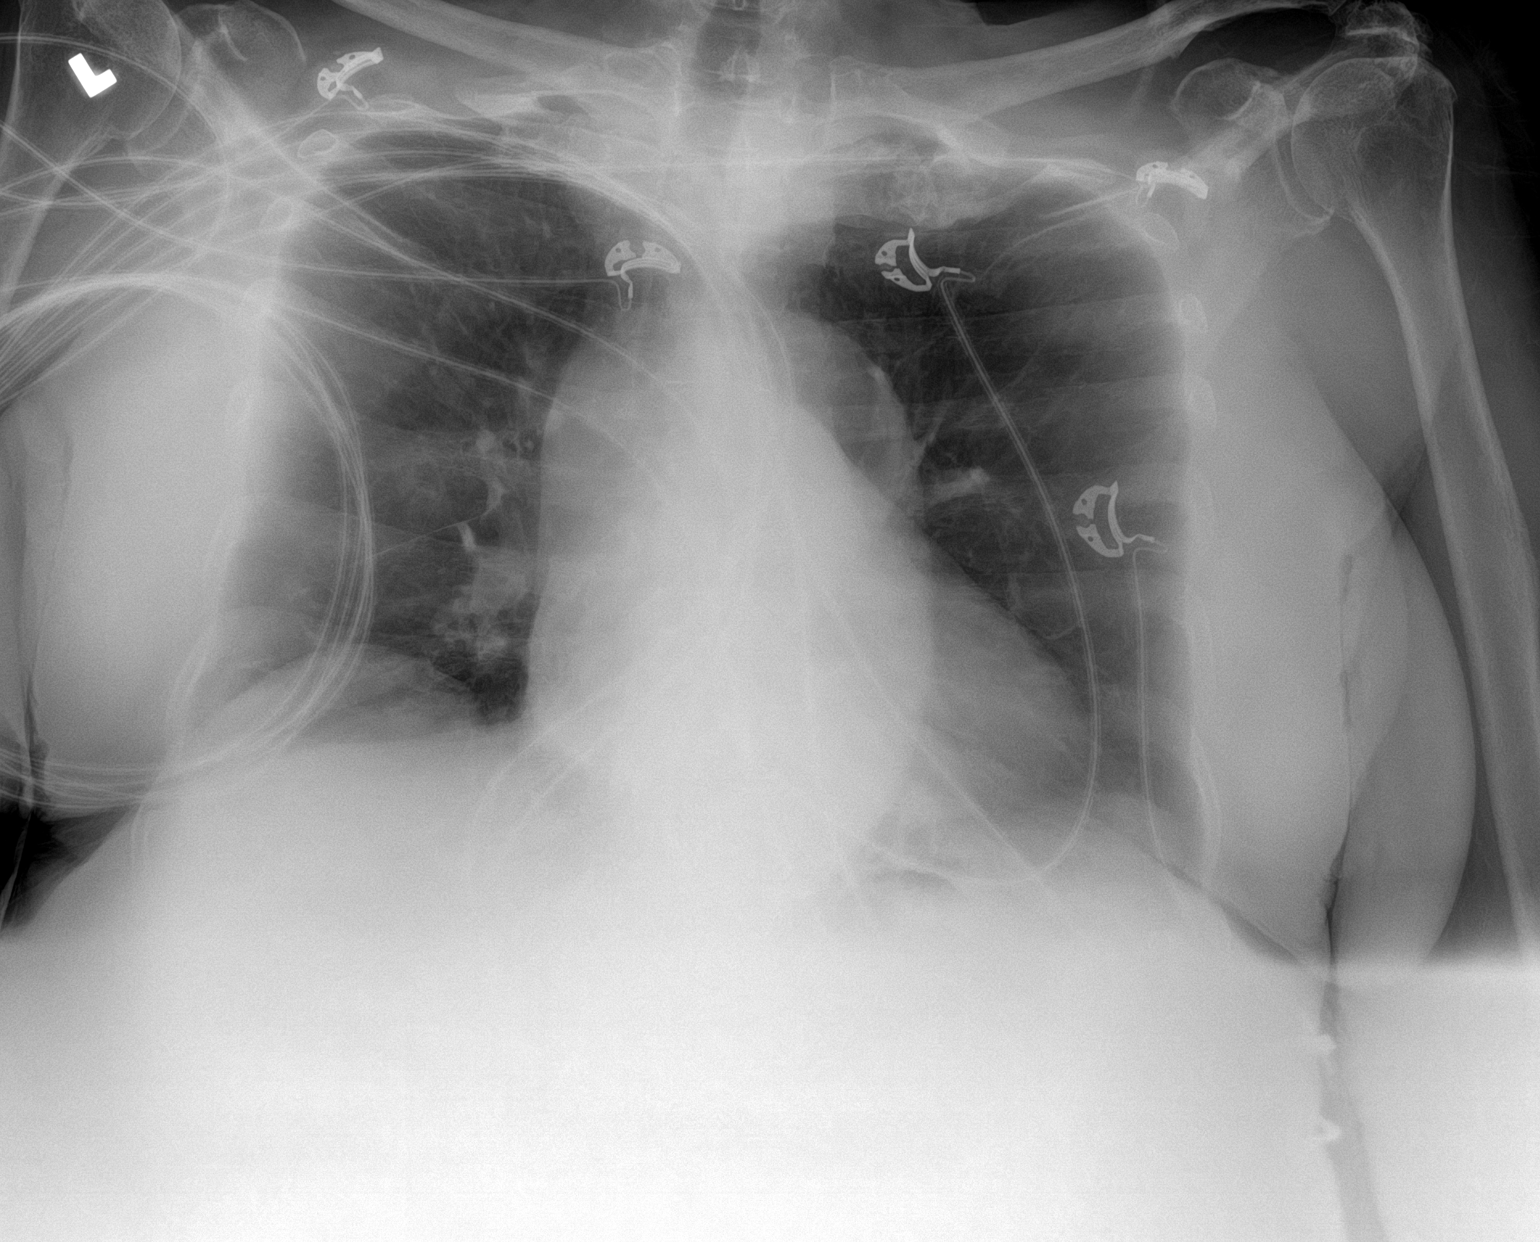

[chest ap (2 of 2)]
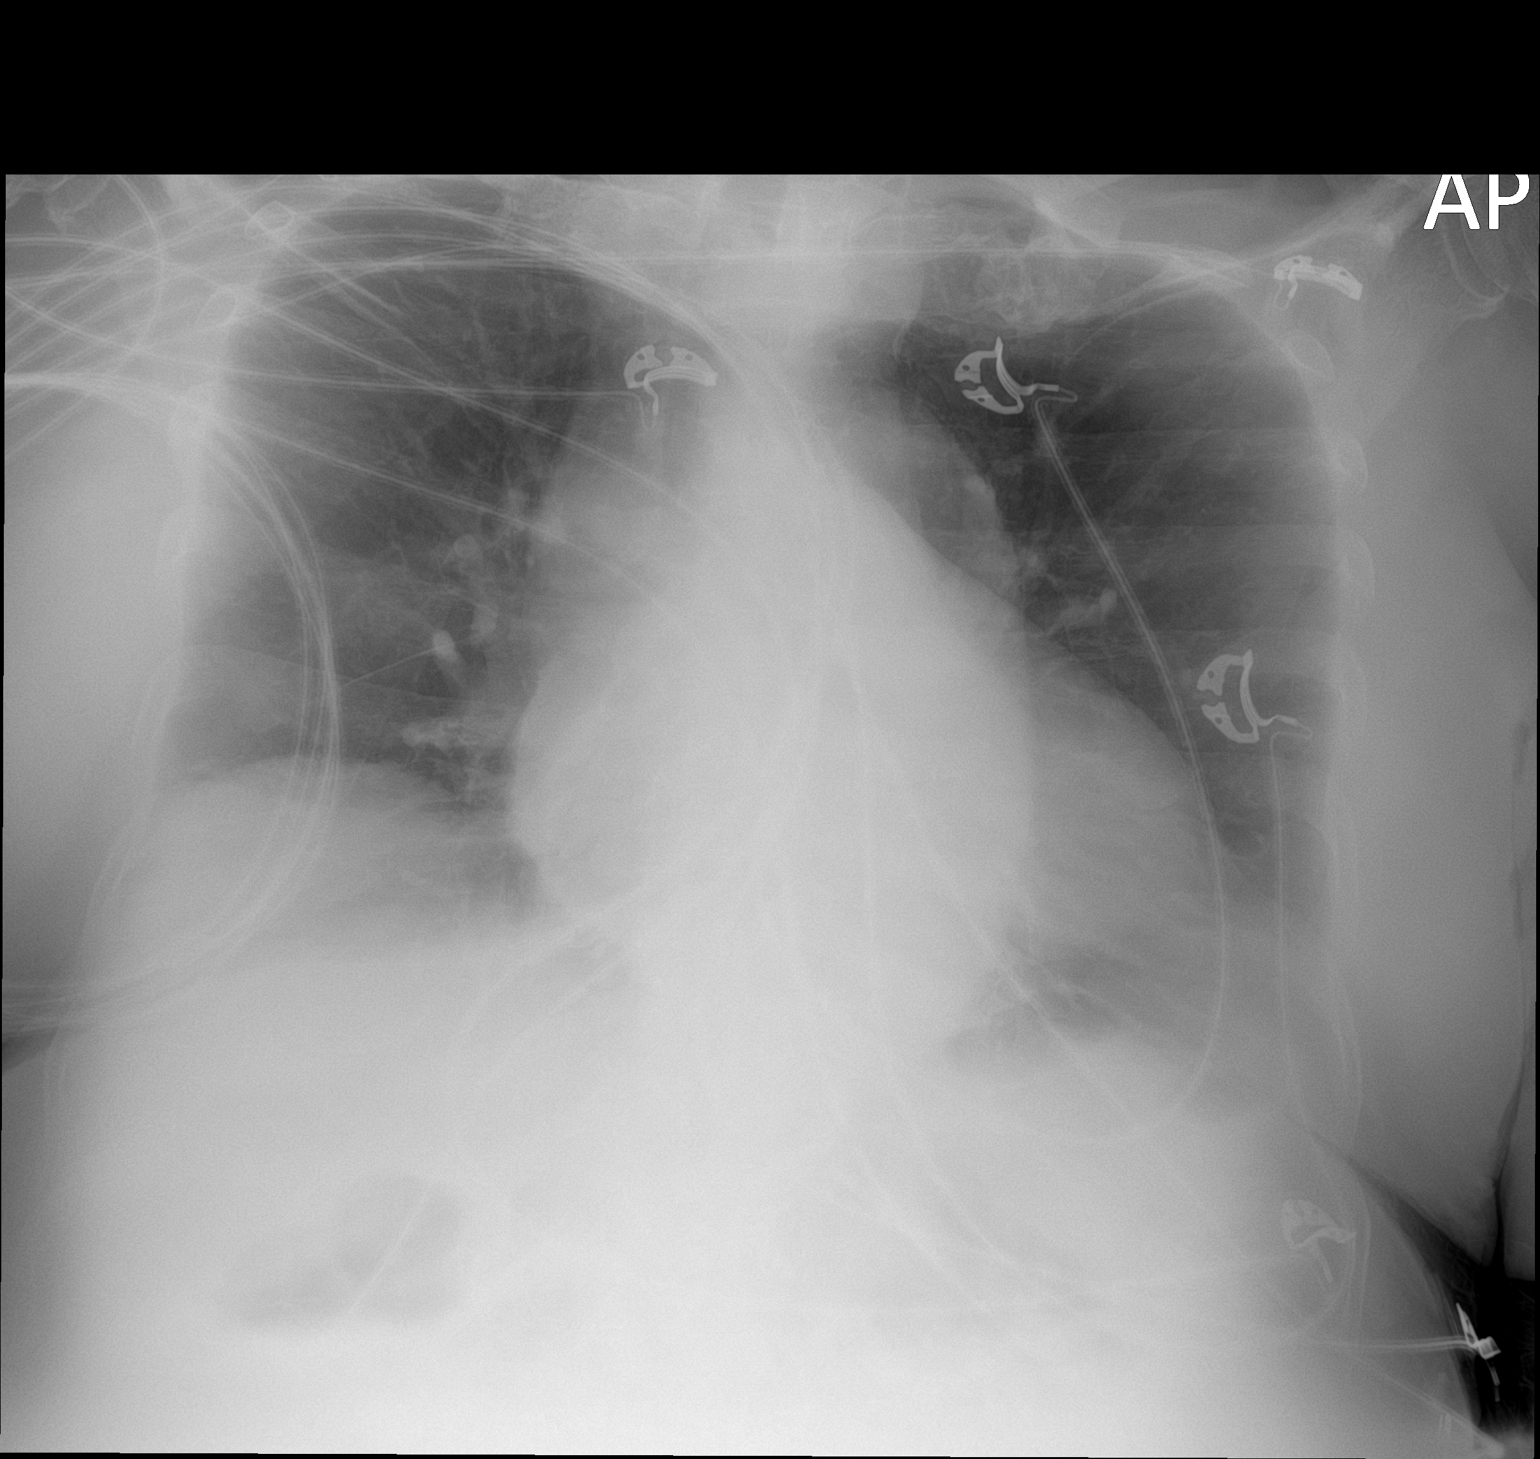

[2 of 2 positions shown; findings below may reference images not displayed]

FINDINGS: Normal mediastinum and cardiac silhouette. Ectatic aorta. Normal
pulmonary vasculature. No evidence of effusion, infiltrate, or
pneumothorax. No acute bony abnormality.
IMPRESSION: No acute cardiopulmonary process.

## 2023-01-26 IMAGING — DX DG HIP (WITH OR WITHOUT PELVIS) 3-4V BILAT
5 series · 5 of 5 positions shown · non-contrast
Comparison: Radiograph 06/18/2021

CLINICAL DATA: Fall, dizziness

EXAM:
DG HIP (WITH OR WITHOUT PELVIS) 3-4V BILAT

[pelvis ap]
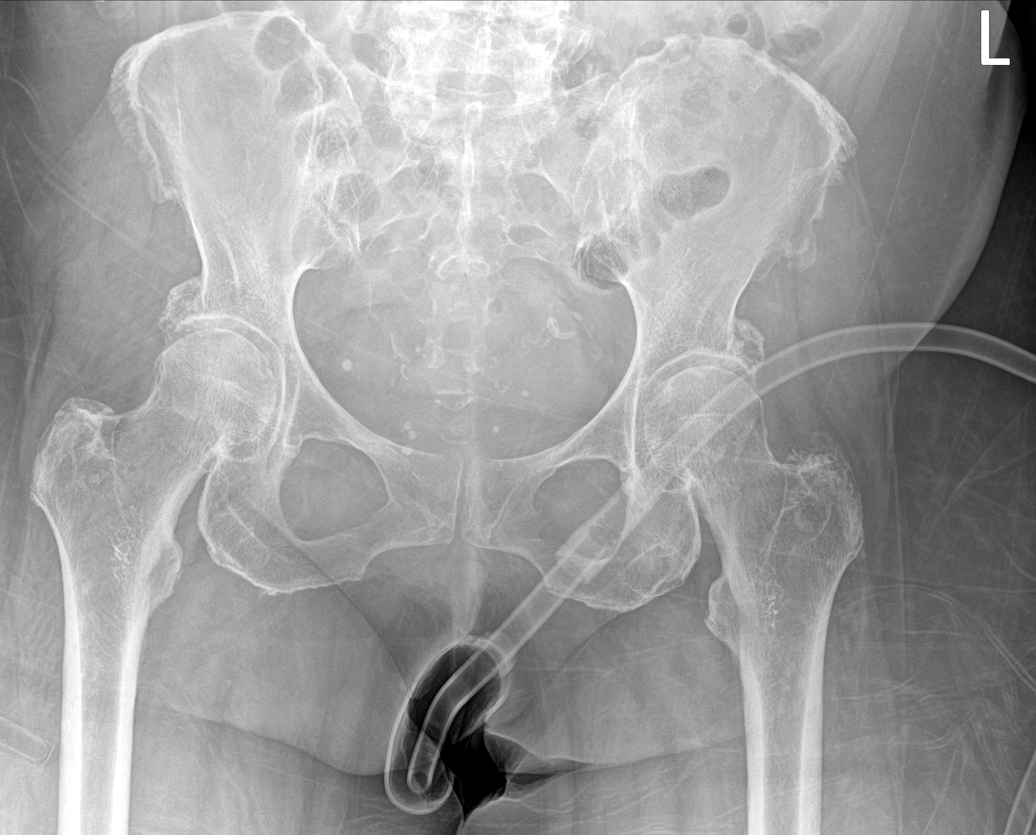

[hip ap (1 of 2)]
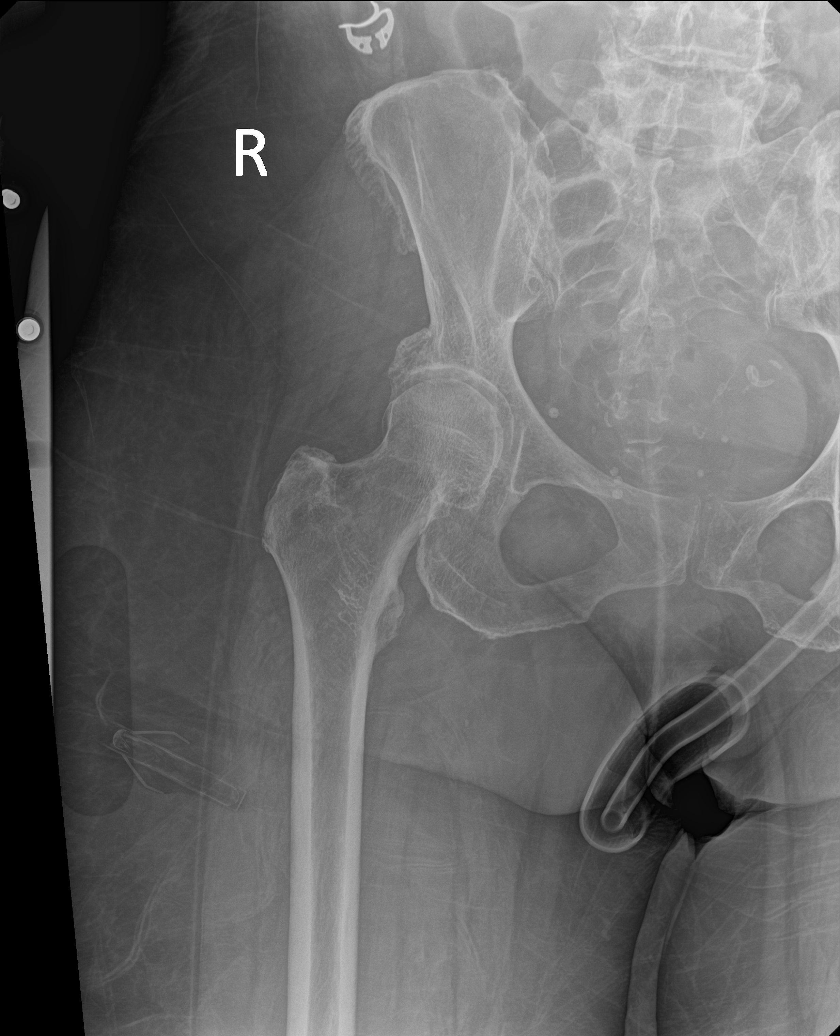

[hip lat (1 of 2)]
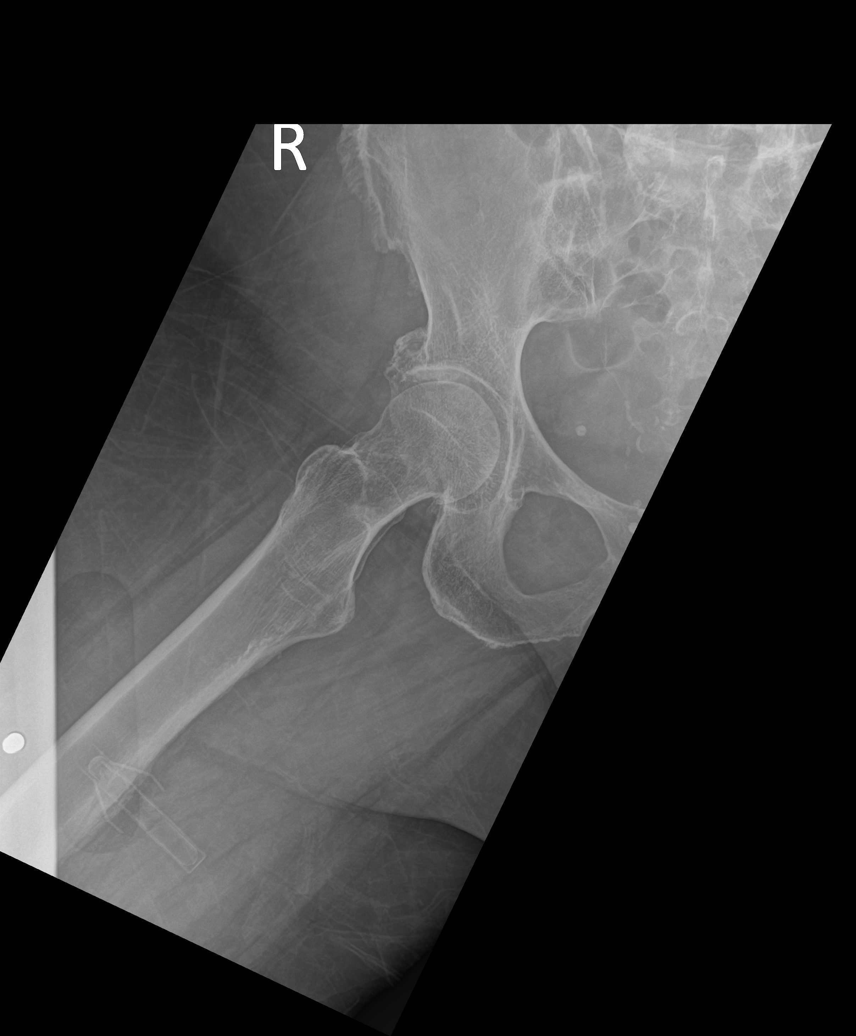

[hip lat (2 of 2)]
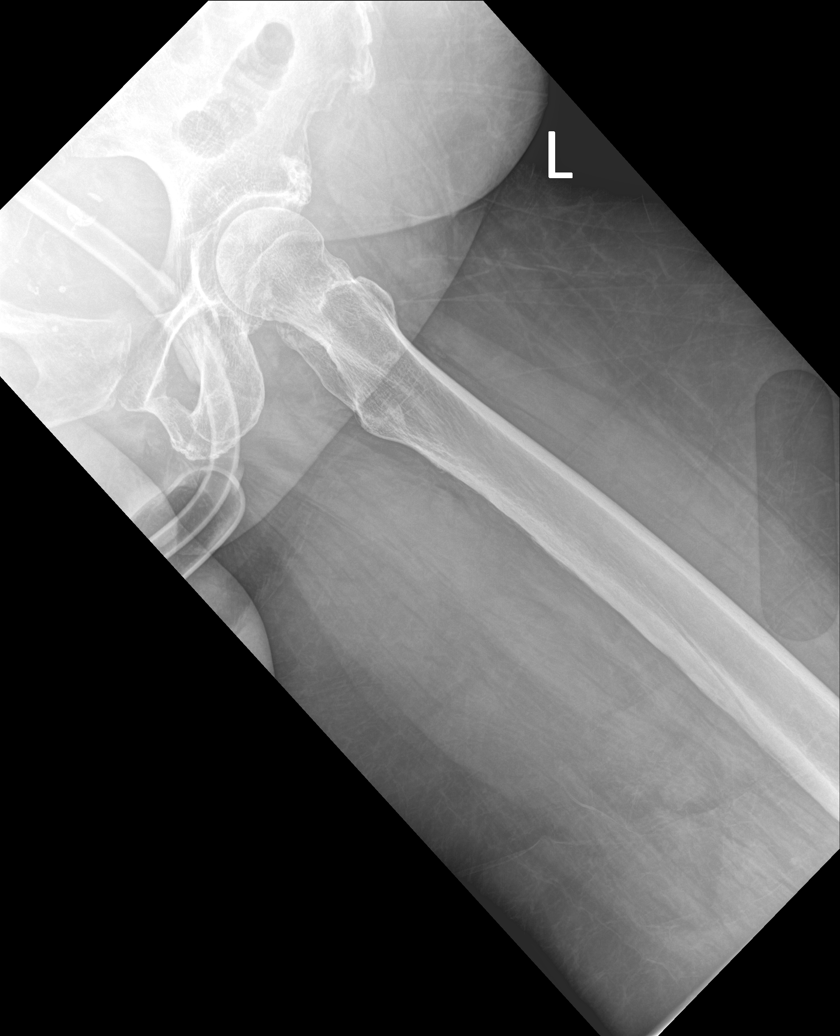

[hip ap (2 of 2)]
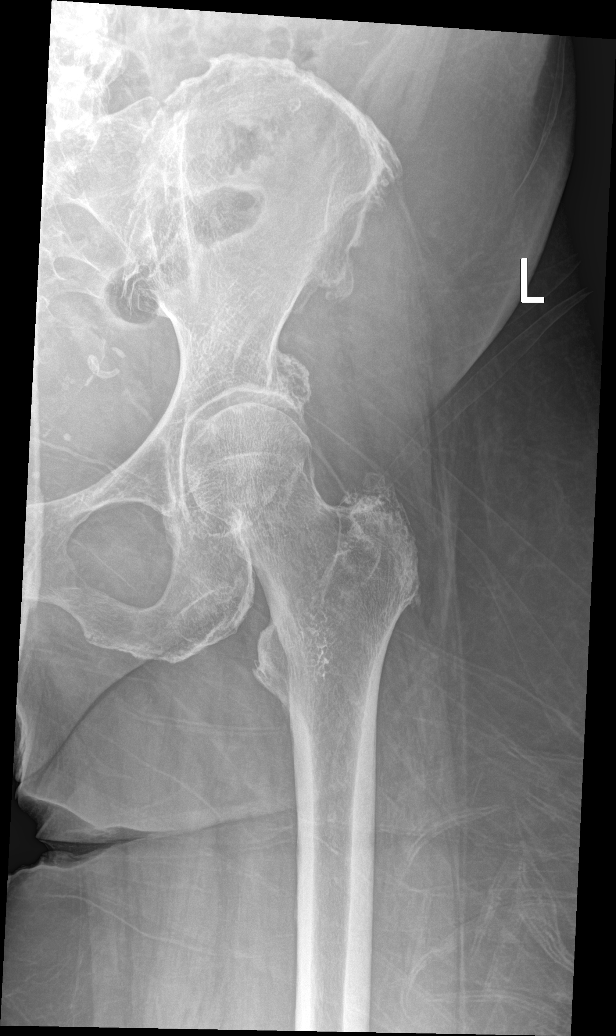

[5 of 5 positions shown; findings below may reference images not displayed]

FINDINGS: There is no evidence of acute fracture. There is moderate to severe
bilateral hip osteoarthritis.
IMPRESSION: No evidence of acute fracture.

Unchanged moderate-severe bilateral hip osteoarthritis.

## 2023-01-26 IMAGING — CT CT HEAD W/O CM
4 series · 17 of 47 positions shown, 19 images · non-contrast
Comparison: 03/19/2021

CLINICAL DATA: Head trauma. Weakness with 2 falls in the past 12
hours.

EXAM:
CT HEAD WITHOUT CONTRAST
TECHNIQUE: Contiguous axial images were obtained from the base of the skull
through the vertex without intravenous contrast.

[Series 2: head wo · axial · 0.41mm/px · z∈[-79,+31]mm · 7 of 30 slices shown, 9 images]
[im 4/30  brain]
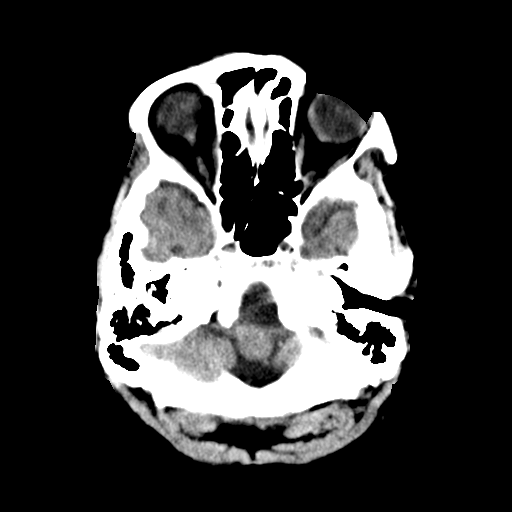
[im 4/30  bone]
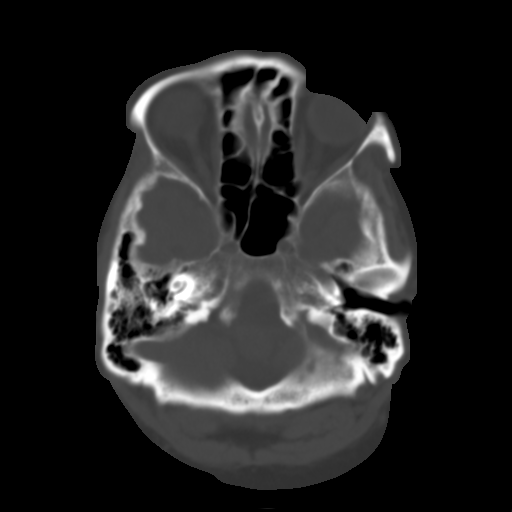
[im 8/30  brain]
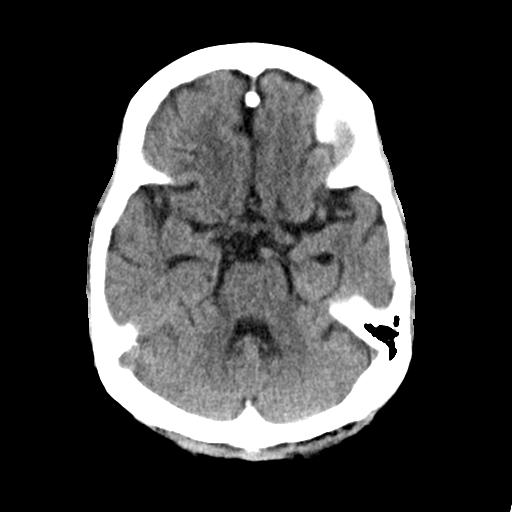
[im 11/30  brain]
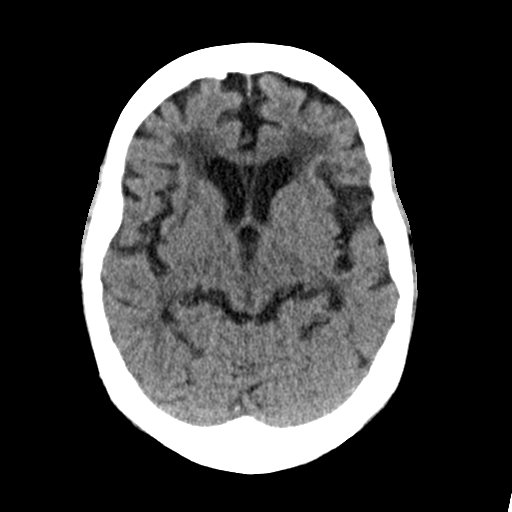
[im 15/30  brain]
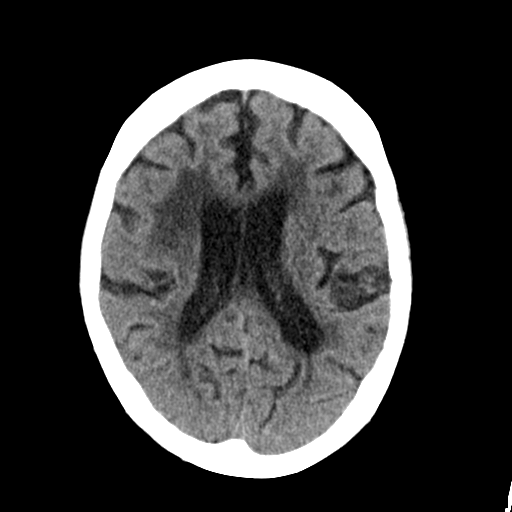
[im 19/30  brain]
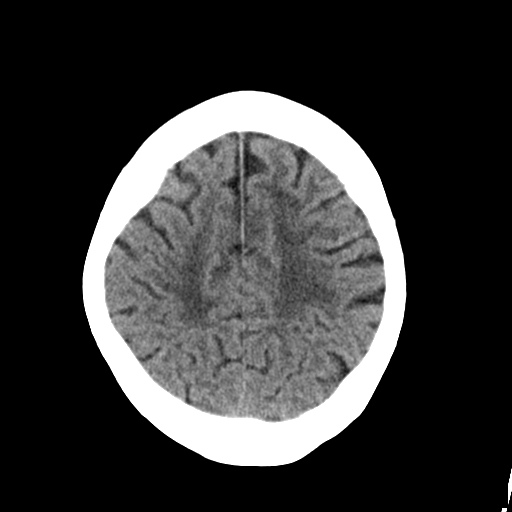
[im 19/30  bone]
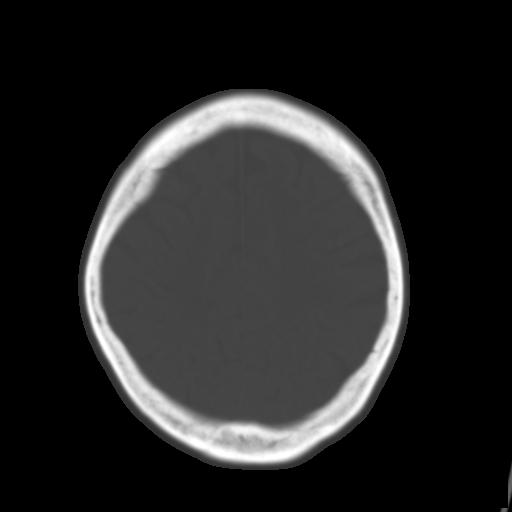
[im 22/30  brain]
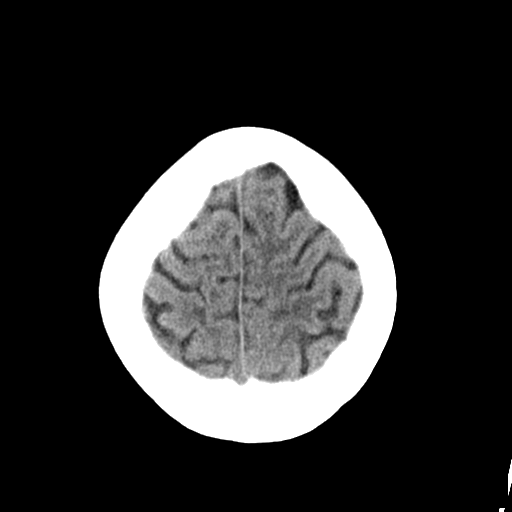
[im 26/30  brain]
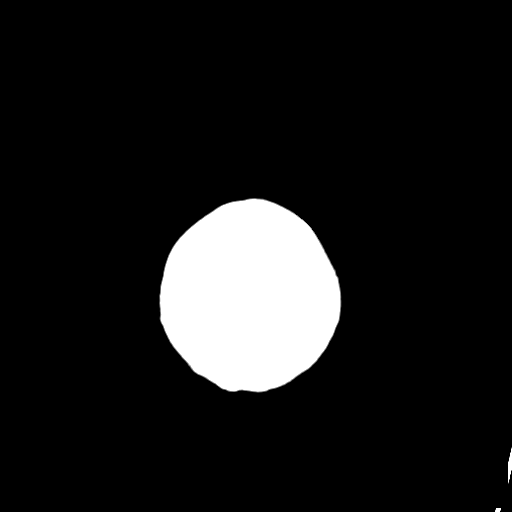

[Series 4: head bone · axial · 0.41mm/px · z∈[-80,-28]mm · 4 of 75 slices shown]
[im 8/75  bone]
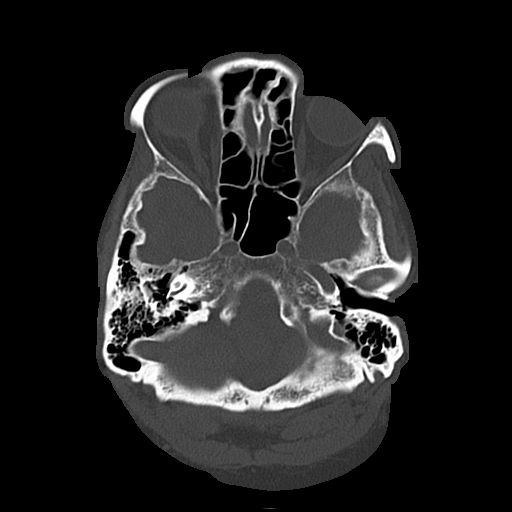
[im 15/75  bone]
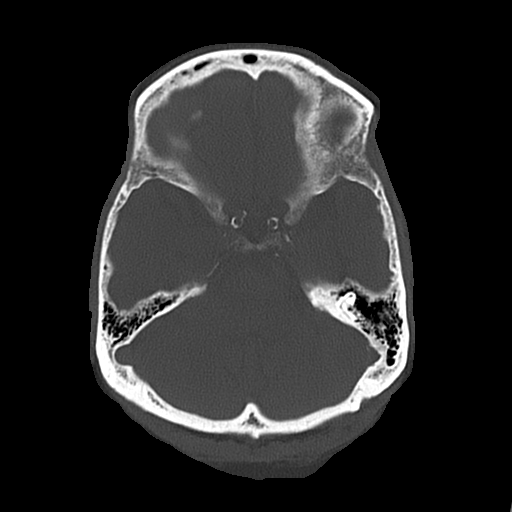
[im 23/75  bone]
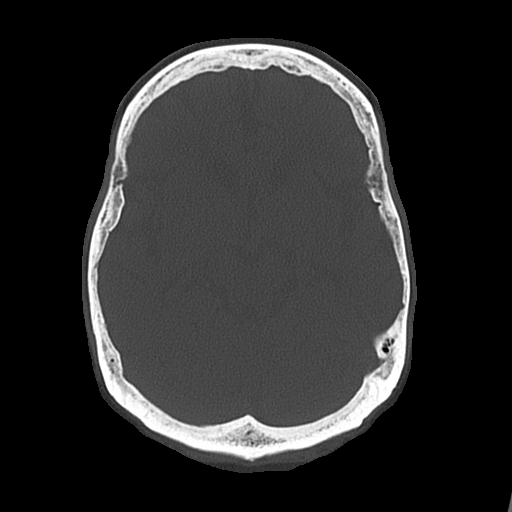
[im 34/75  bone]
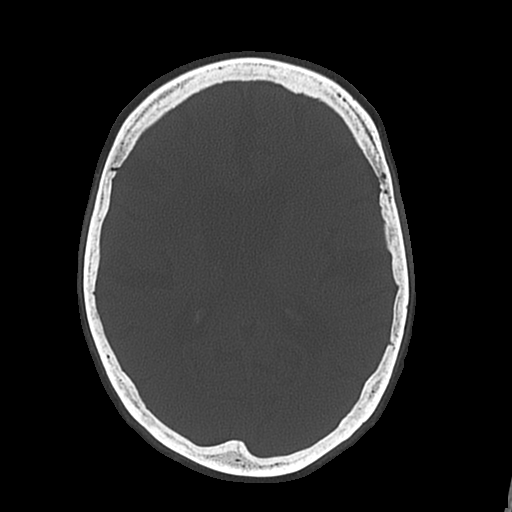

[Series 5: coronal soft · coronal · 0.32mm/px · 3 of 63 slices shown]
[im 21/63  brain]
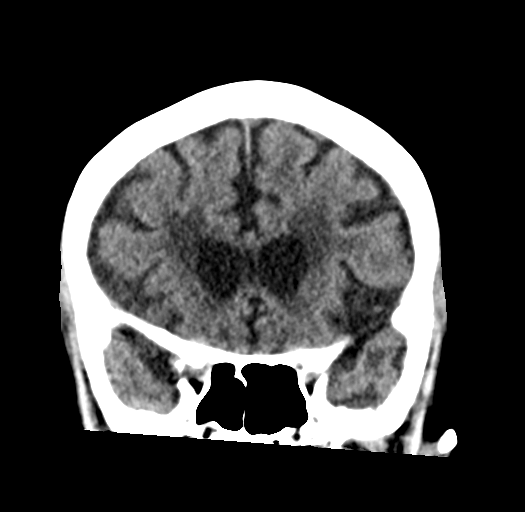
[im 28/63  brain]
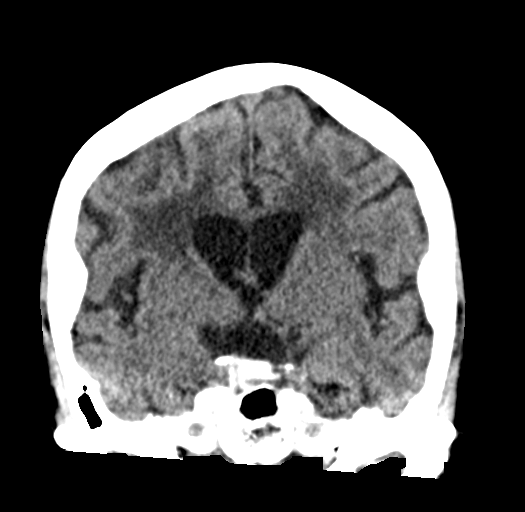
[im 35/63  brain]
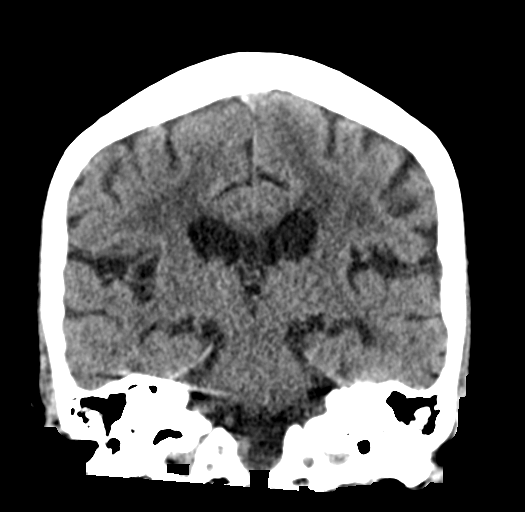

[Series 6: sagittal soft · sagittal · 0.32mm/px · 3 of 56 slices shown]
[im 19/56  brain]
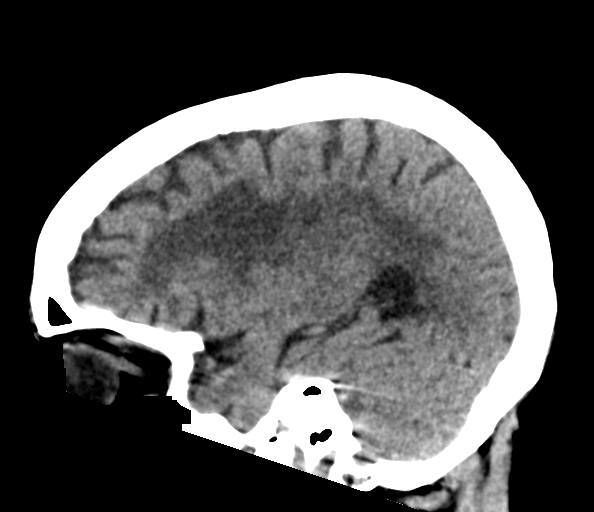
[im 28/56  brain]
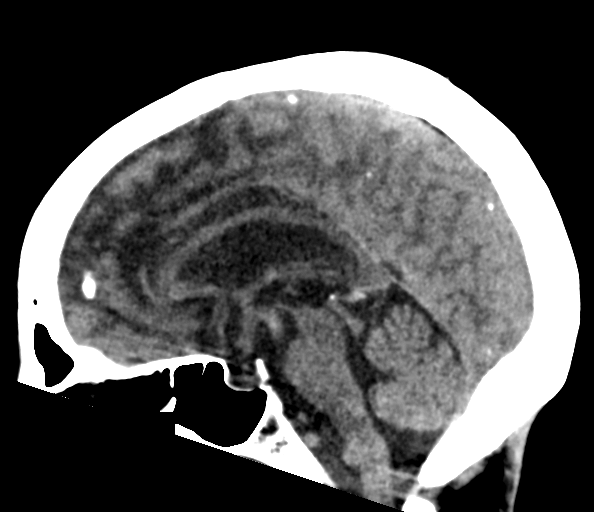
[im 37/56  brain]
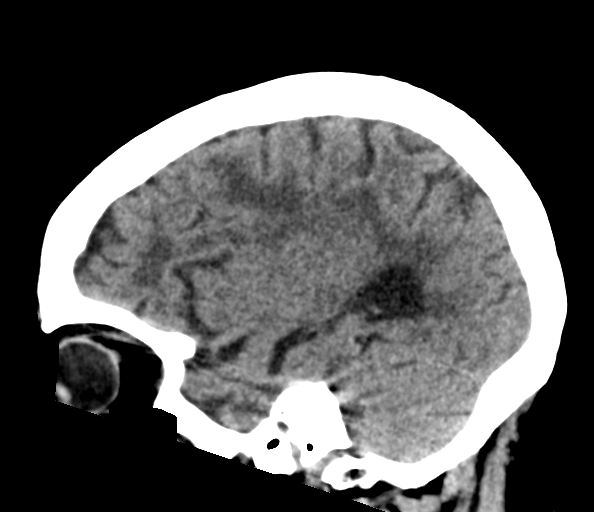

[17 of 47 positions shown; findings below may reference images not displayed]

FINDINGS: Brain: No evidence of acute infarction, hemorrhage, hydrocephalus,
extra-axial collection or mass lesion/mass effect. There is moderate
diffuse low-attenuation within the subcortical and periventricular
white matter compatible with chronic microvascular disease. Mild
prominence of the sulci and ventricles compatible with brain
atrophy.

Vascular: No hyperdense vessel or unexpected calcification.

Skull: Normal. Negative for fracture or focal lesion.

Sinuses/Orbits: None

Other: None
IMPRESSION: 1. No acute intracranial abnormalities.
2. Chronic small vessel ischemic change and brain atrophy.

## 2023-01-26 IMAGING — CT CT L SPINE W/O CM
3 series · 11 of 33 positions shown, 13 images · non-contrast
Comparison: MRI lumbar spine 06/18/2021.

CLINICAL DATA: Lower back pain, trauma

EXAM:
CT LUMBAR SPINE WITHOUT CONTRAST
TECHNIQUE: Multidetector CT imaging of the lumbar spine was performed without
intravenous contrast administration. Multiplanar CT image
reconstructions were also generated.

[Series 5: l-spine wo soft tissue · axial · 0.32mm/px · z∈[-614,-440]mm · 3 of 142 slices shown, 4 images]
[im 33/142  soft-tissue]
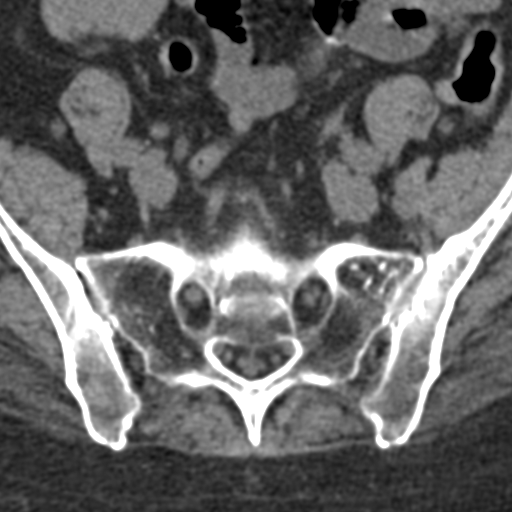
[im 33/142  bone]
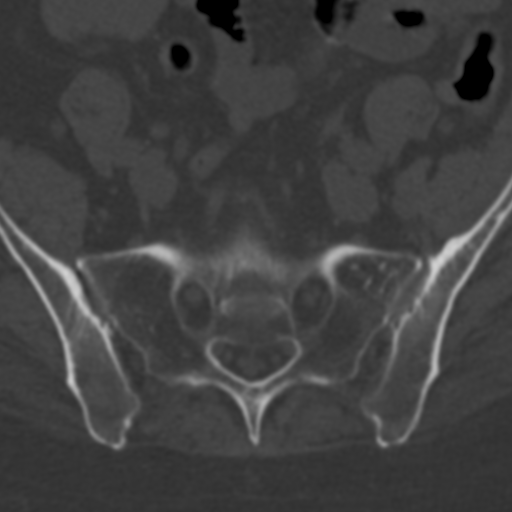
[im 76/142  bone]
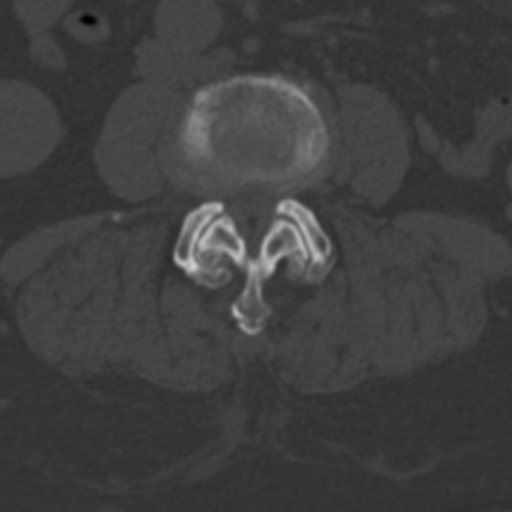
[im 120/142  bone]
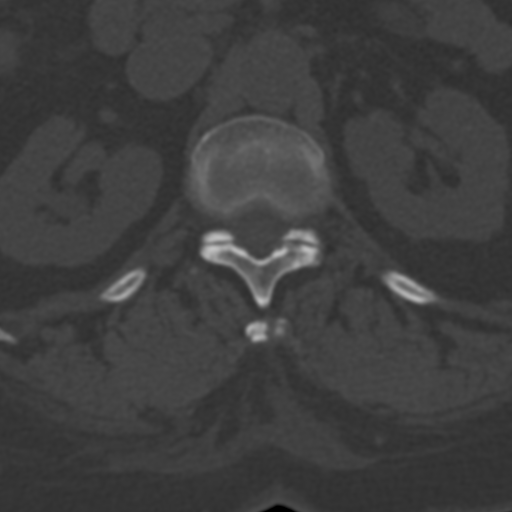

[Series 6: coronal bone · coronal · 0.36mm/px · 3 of 76 slices shown]
[im 16/76  bone]
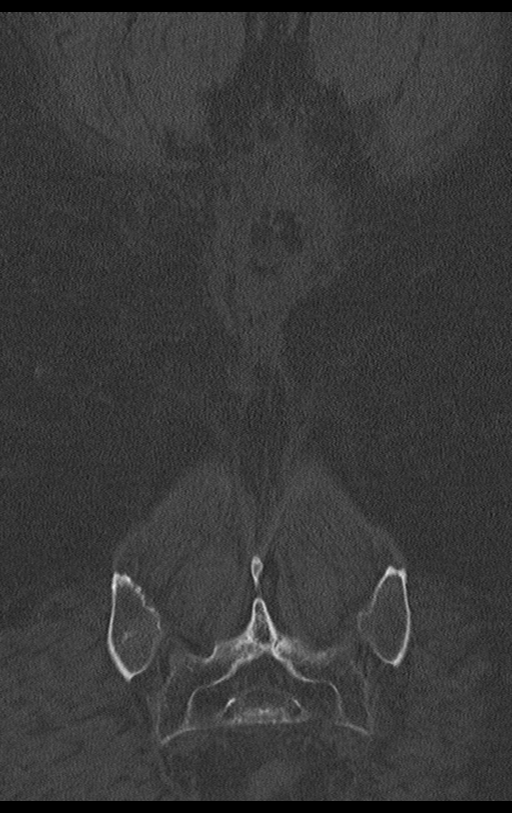
[im 31/76  bone]
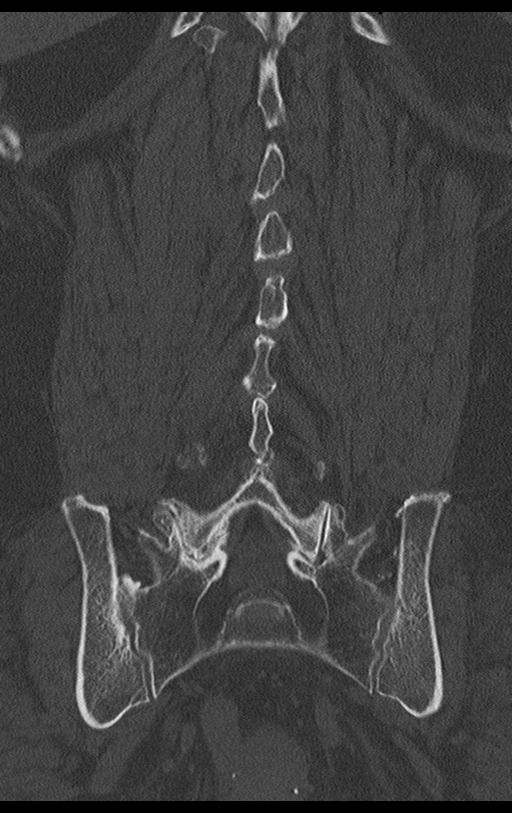
[im 46/76  bone]
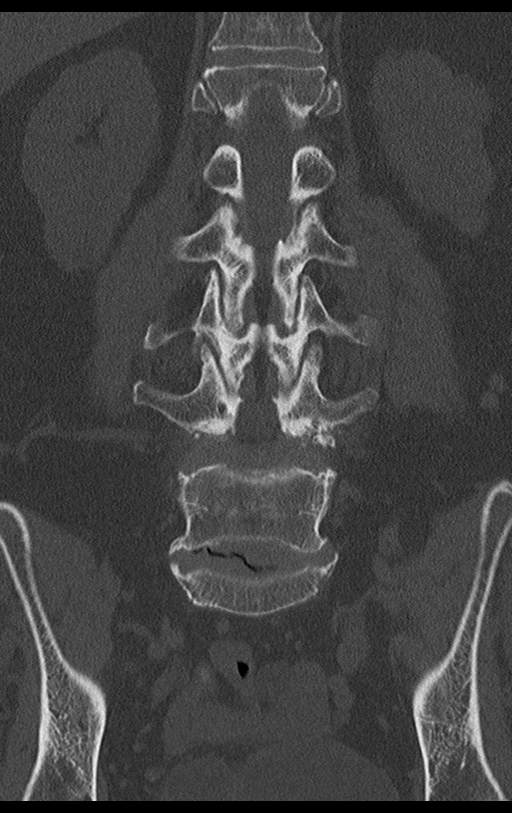

[Series 7: sagittal bone · sagittal · 0.32mm/px · 5 of 93 slices shown, 6 images]
[im 31/93  bone]
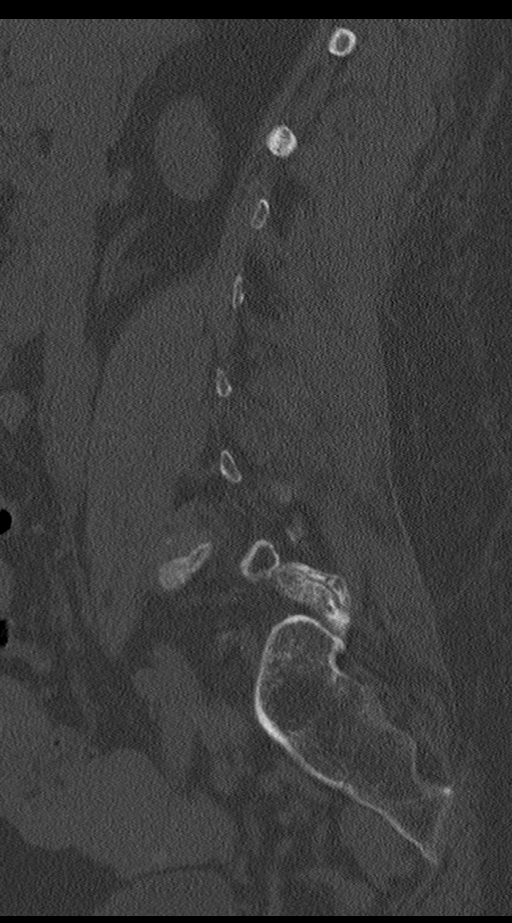
[im 39/93  bone]
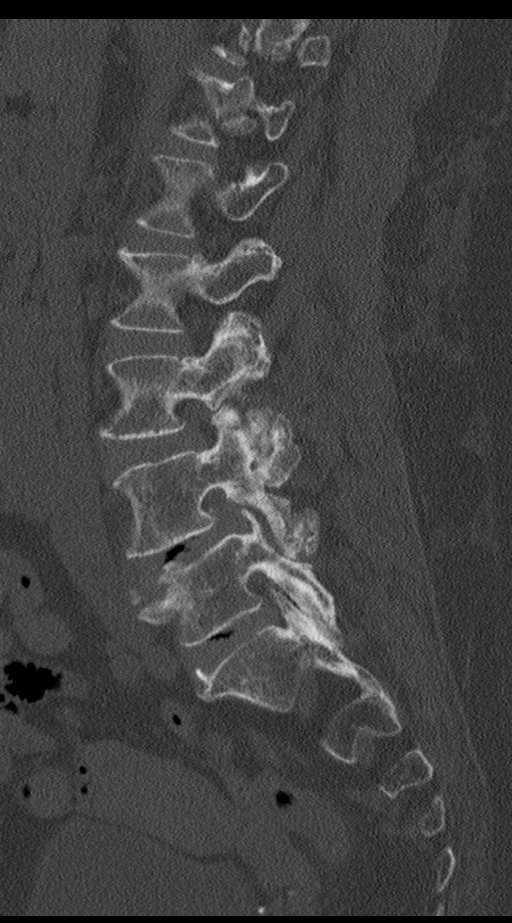
[im 47/93  soft-tissue]
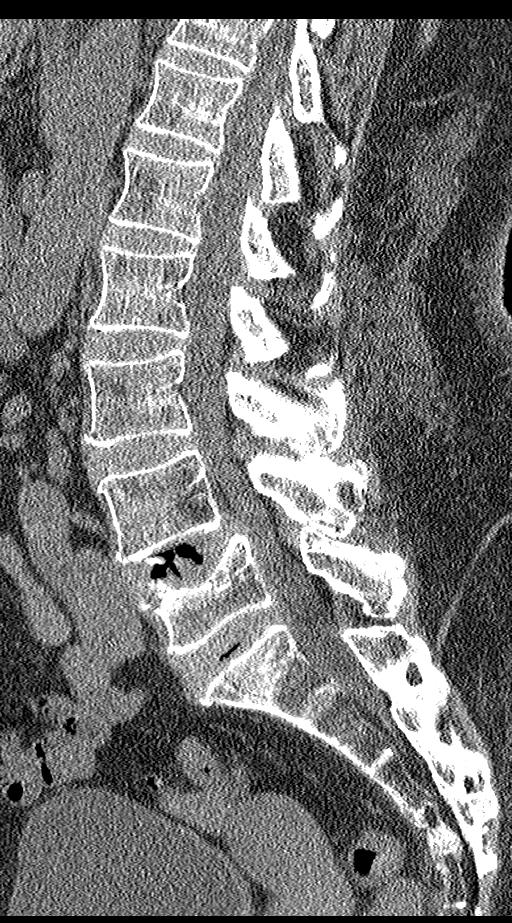
[im 47/93  bone]
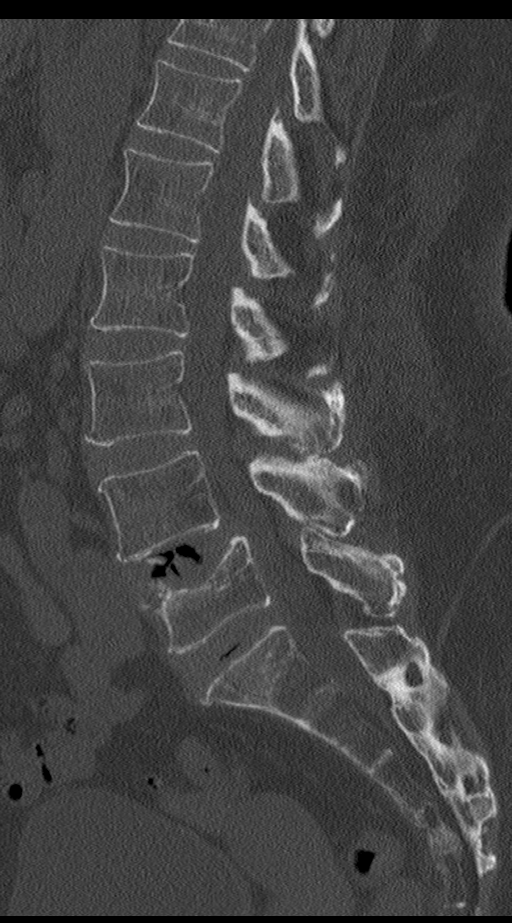
[im 54/93  bone]
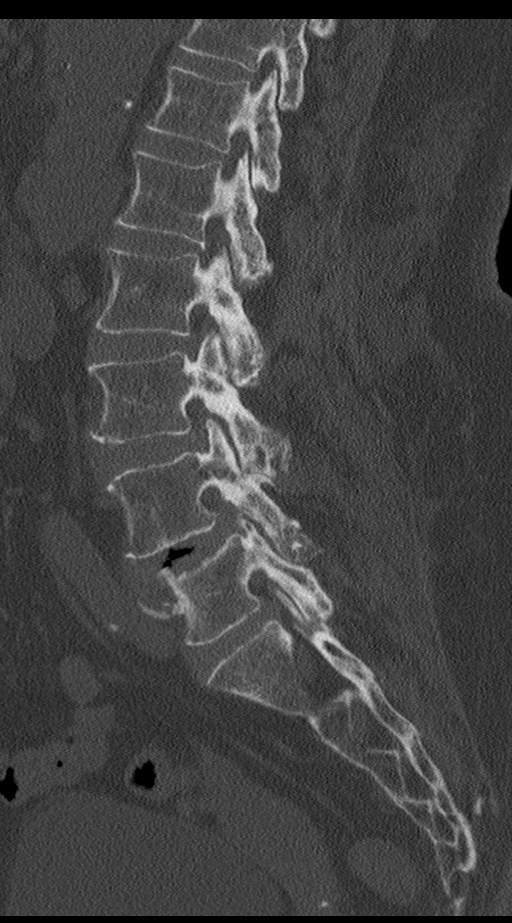
[im 62/93  bone]
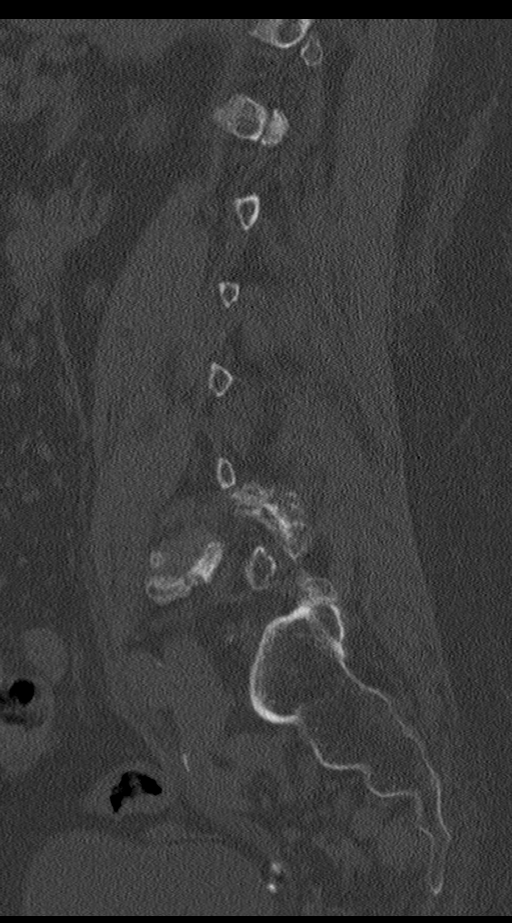

[11 of 33 positions shown; findings below may reference images not displayed]

FINDINGS: Segmentation: 5 lumbar type vertebrae.

Alignment: There is grade 1 anterolisthesis at L4-L5.

Vertebrae: There is a chronic compression deformity of L5 with the
proximally 40% height loss centrally. No aggressive osseous lesion.
No new compression deformity. No evidence of acute fracture.

Paraspinal and other soft tissues: Negative.

Disc levels: Multilevel degenerative disc disease and bilateral
facet arthropathy and grade 1 anterolisthesis at L4-5 resulting in
varying degrees of spinal canal stenosis and neural foraminal
narrowing bilaterally, best described on recent lumbar spine MRI on
06/18/2021
IMPRESSION: Unchanged compression deformity of L5 with approximately 40% height
loss centrally. No evidence of new acute lumbar spine fracture.

Unchanged multilevel degenerative disc disease and facet arthritis
with grade 1 anterolisthesis at L4-L5, varying degrees of spinal
canal stenosis and bilateral neural foraminal narrowing, best
described on recent lumbar spine MRI.

## 2023-03-21 IMAGING — DX DG KNEE COMPLETE 4+V*R*
4 series · 4 of 4 positions shown · non-contrast
Comparison: None.

CLINICAL DATA: Injury

EXAM:
RIGHT KNEE - COMPLETE 4+ VIEW

[knee ap]
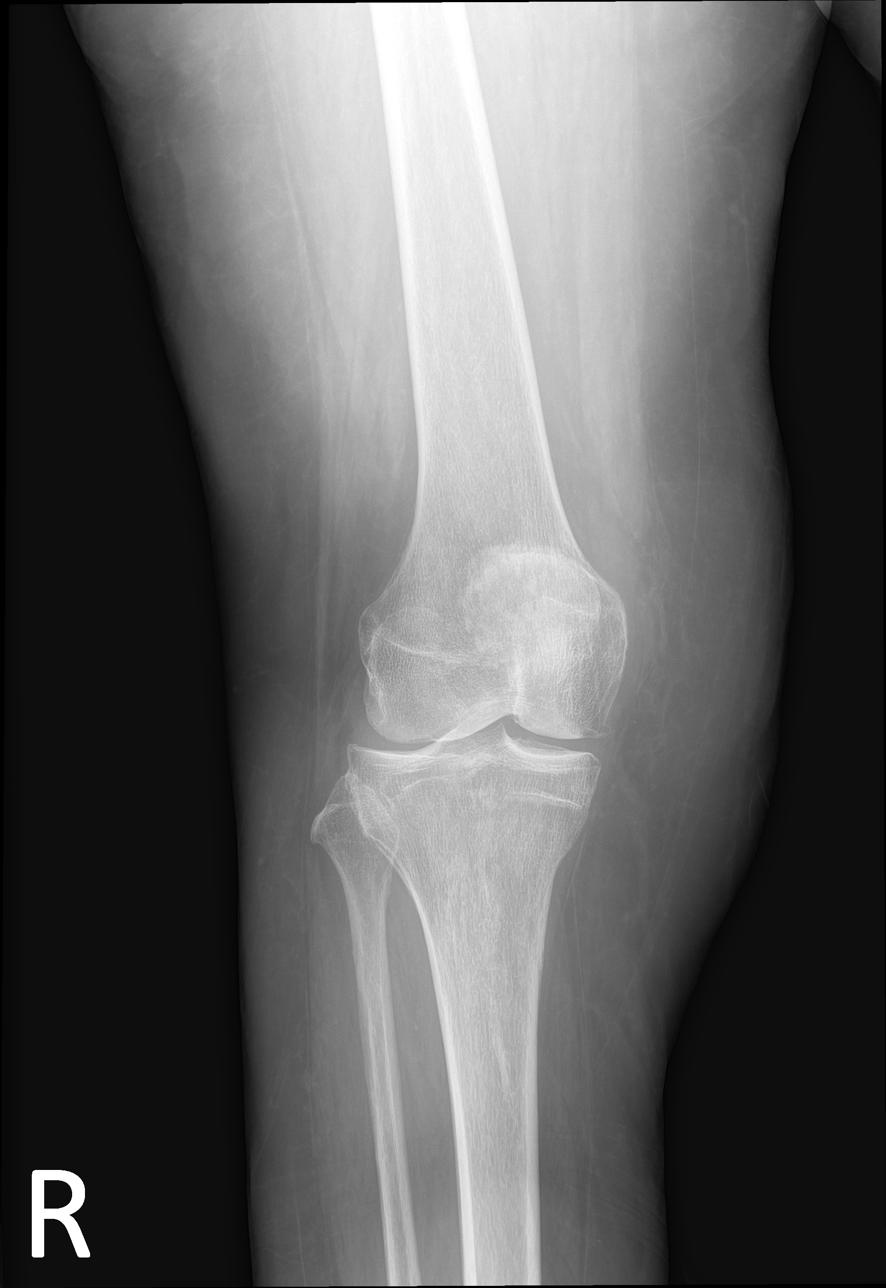

[knee obl (1 of 2)]
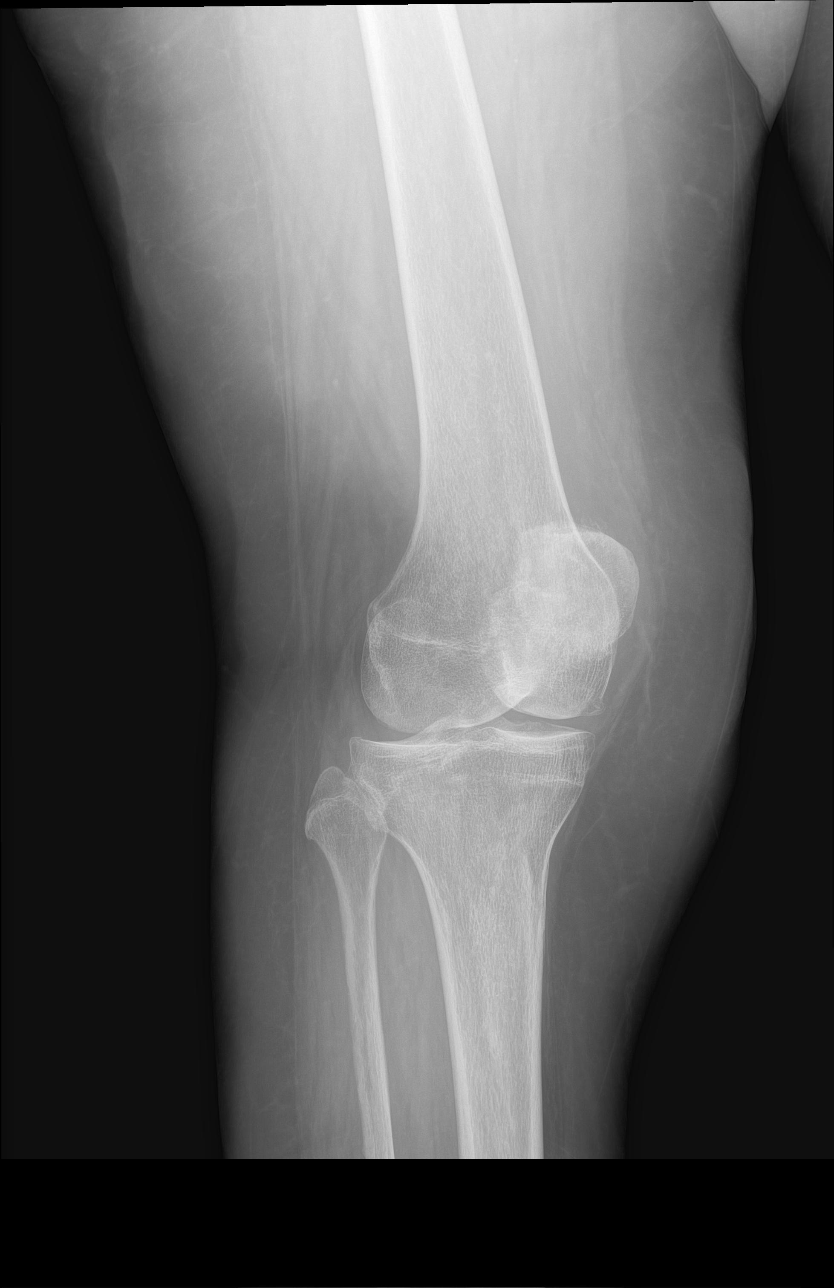

[knee obl (2 of 2)]
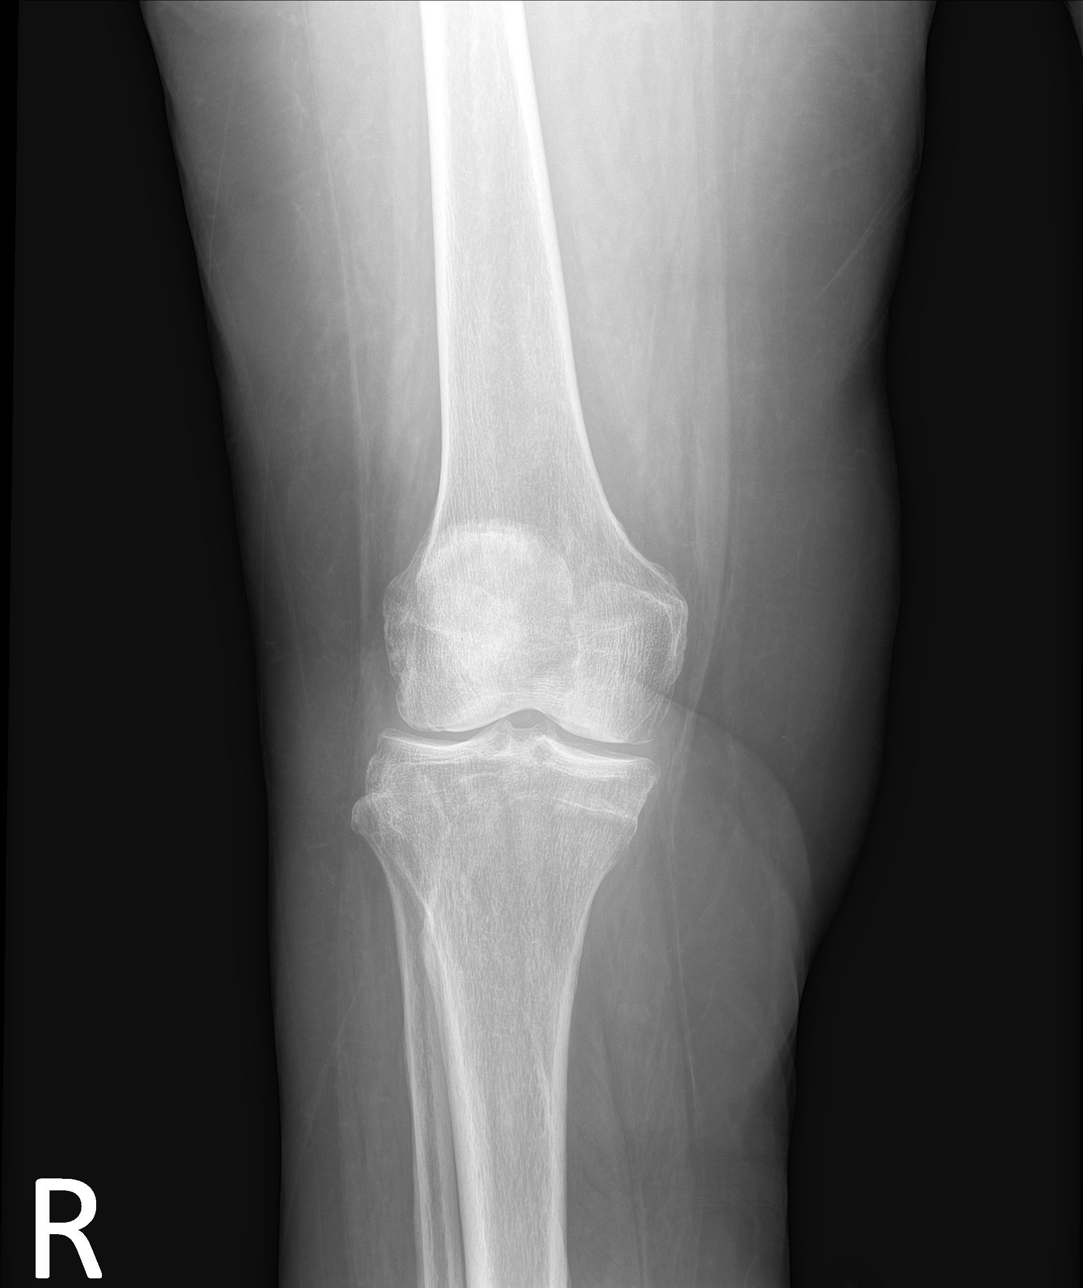

[knee lat]
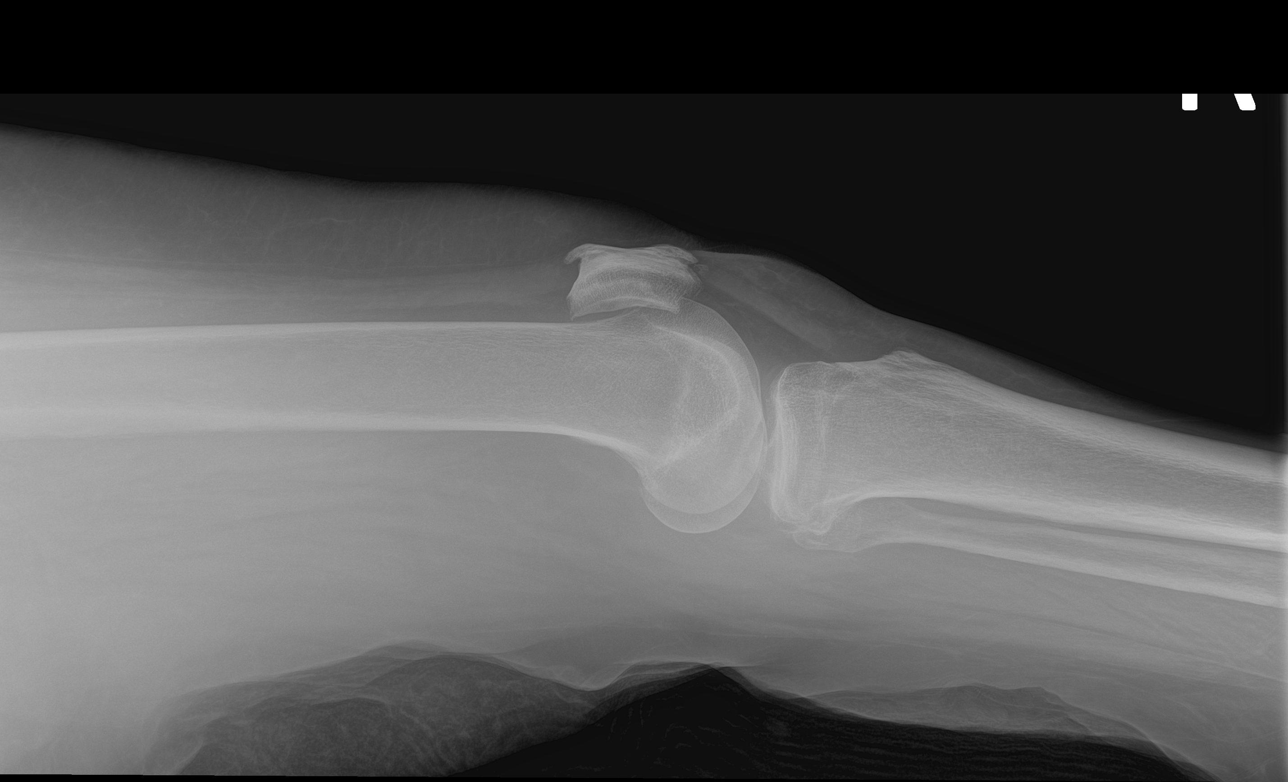

[4 of 4 positions shown; findings below may reference images not displayed]

FINDINGS: Alignment is anatomic. No acute fracture. Mild changes of
osteoarthritis without substantial joint space narrowing. No joint
effusion.
IMPRESSION: No acute fracture.

## 2023-05-20 IMAGING — CR DG HIP (WITH OR WITHOUT PELVIS) 2-3V*L*
3 series · 3 of 3 positions shown · non-contrast
Comparison: 06/27/2021

CLINICAL DATA: LEFT hip pain

EXAM:
DG HIP (WITH OR WITHOUT PELVIS) 2-3V LEFT

[pelvis ap]
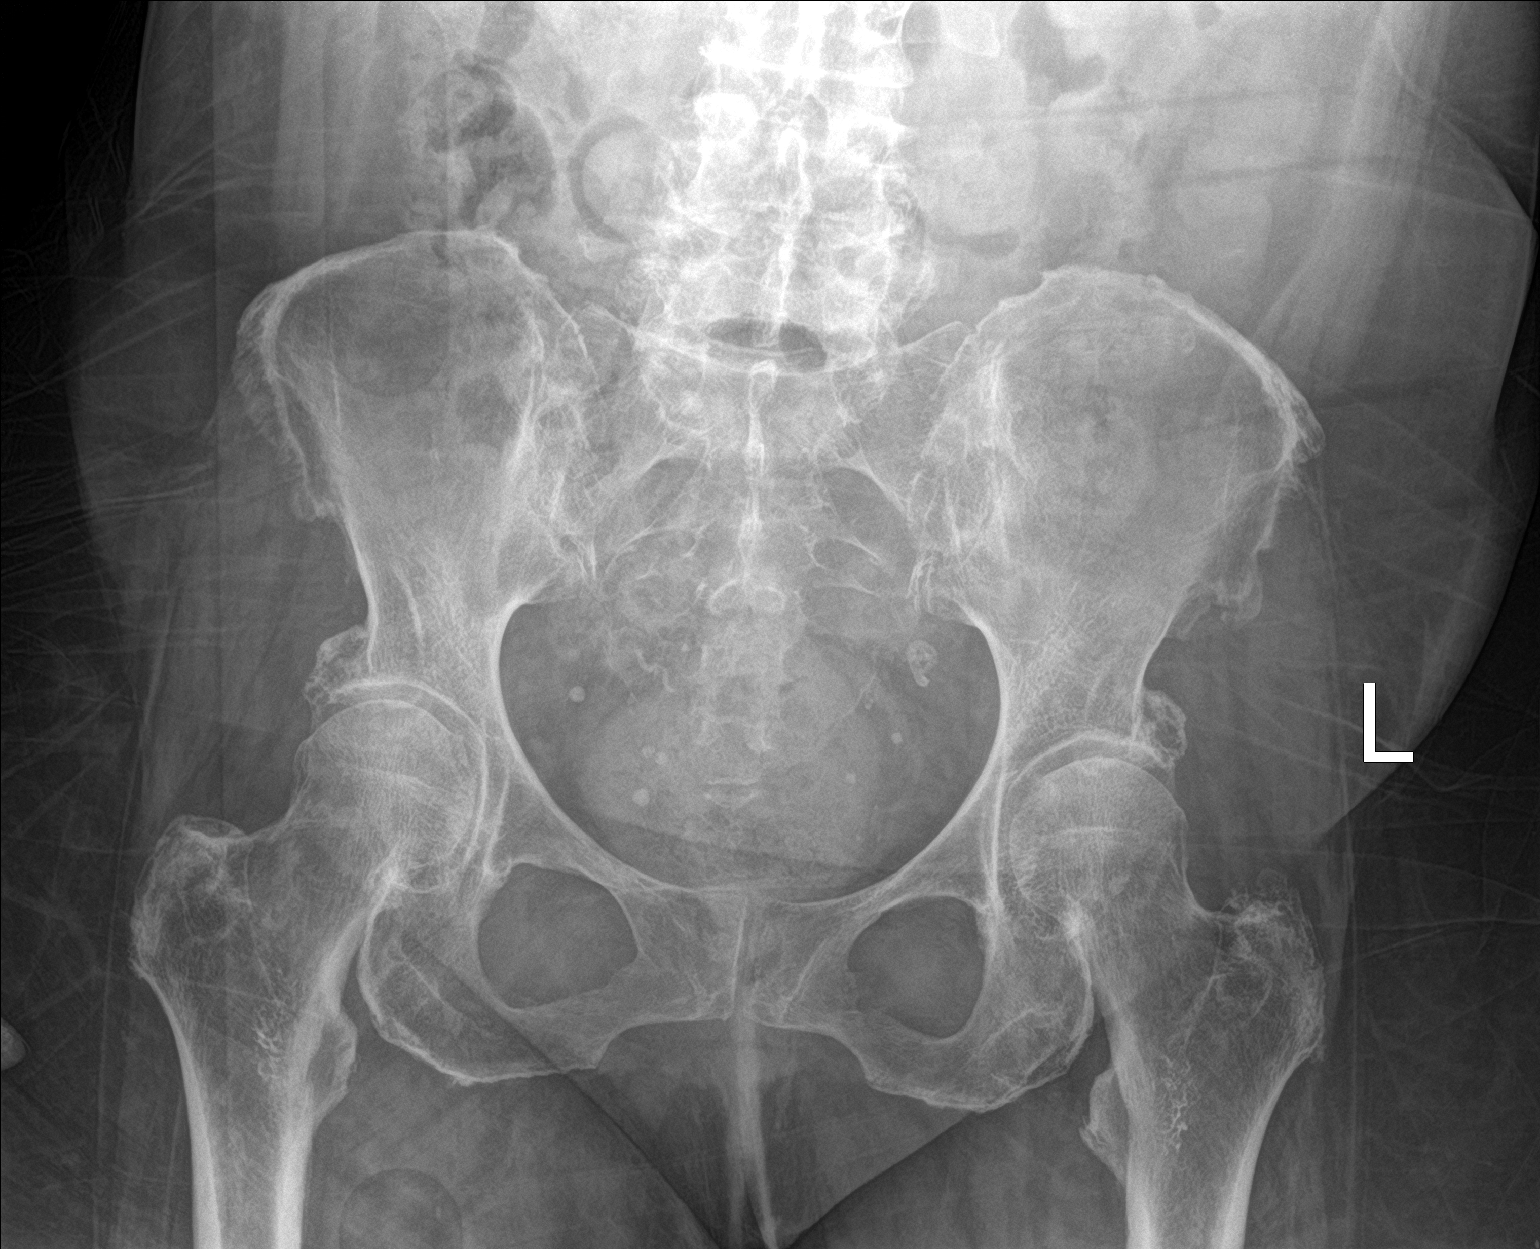

[hip ap]
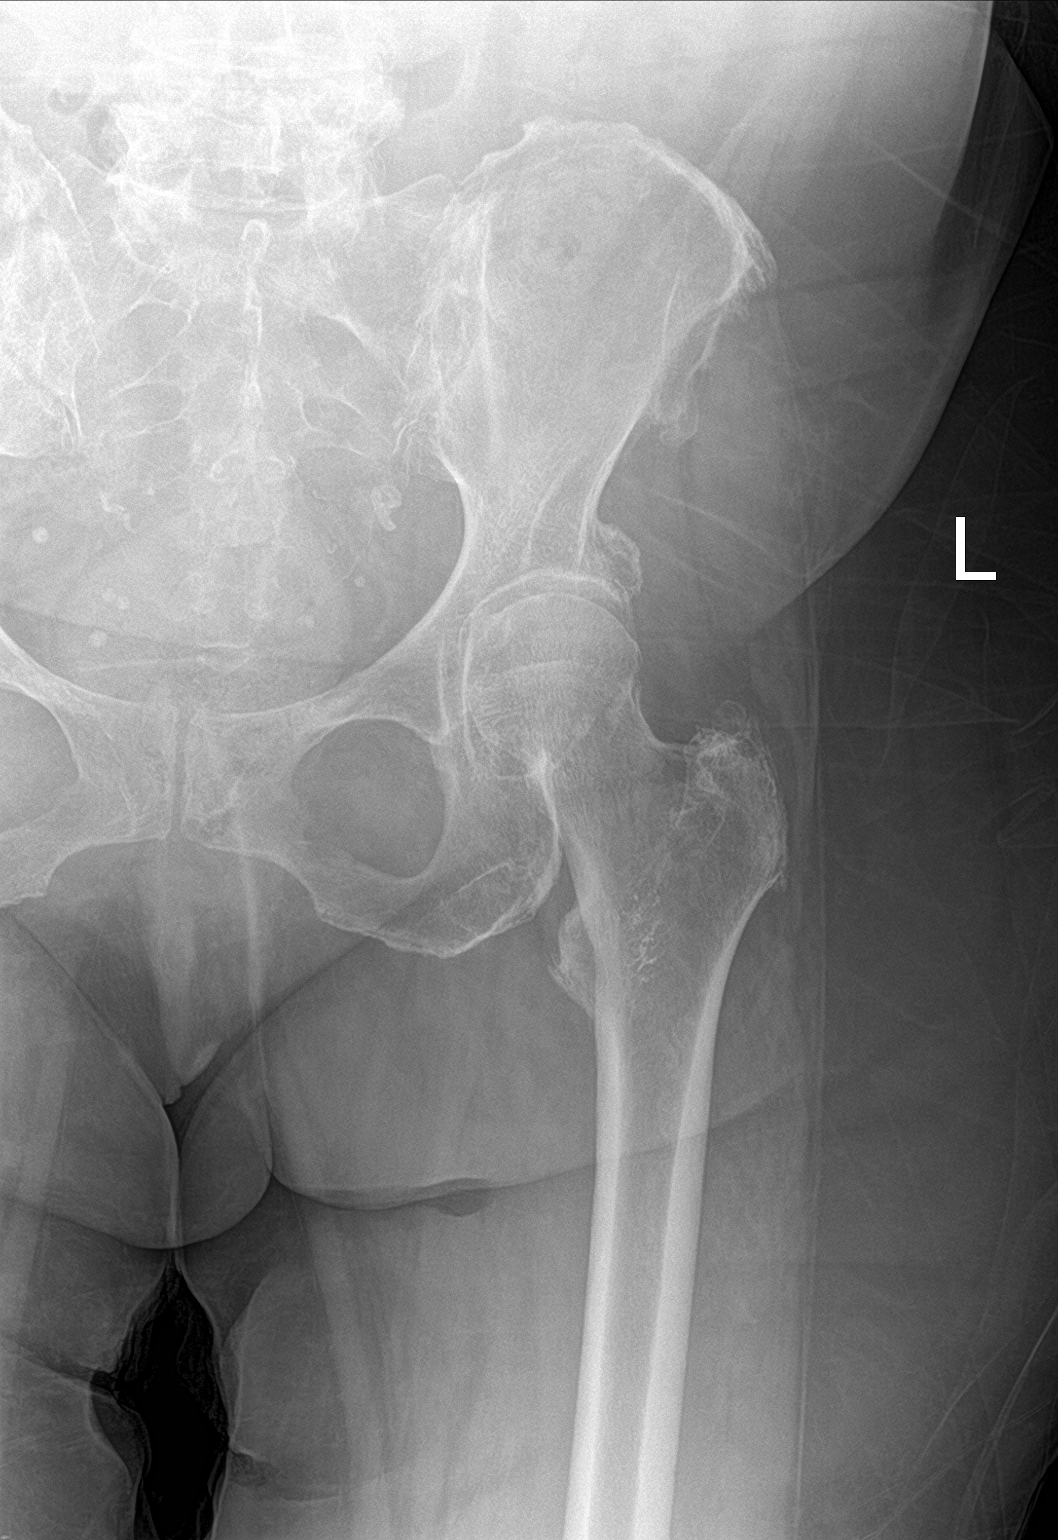

[hip lat]
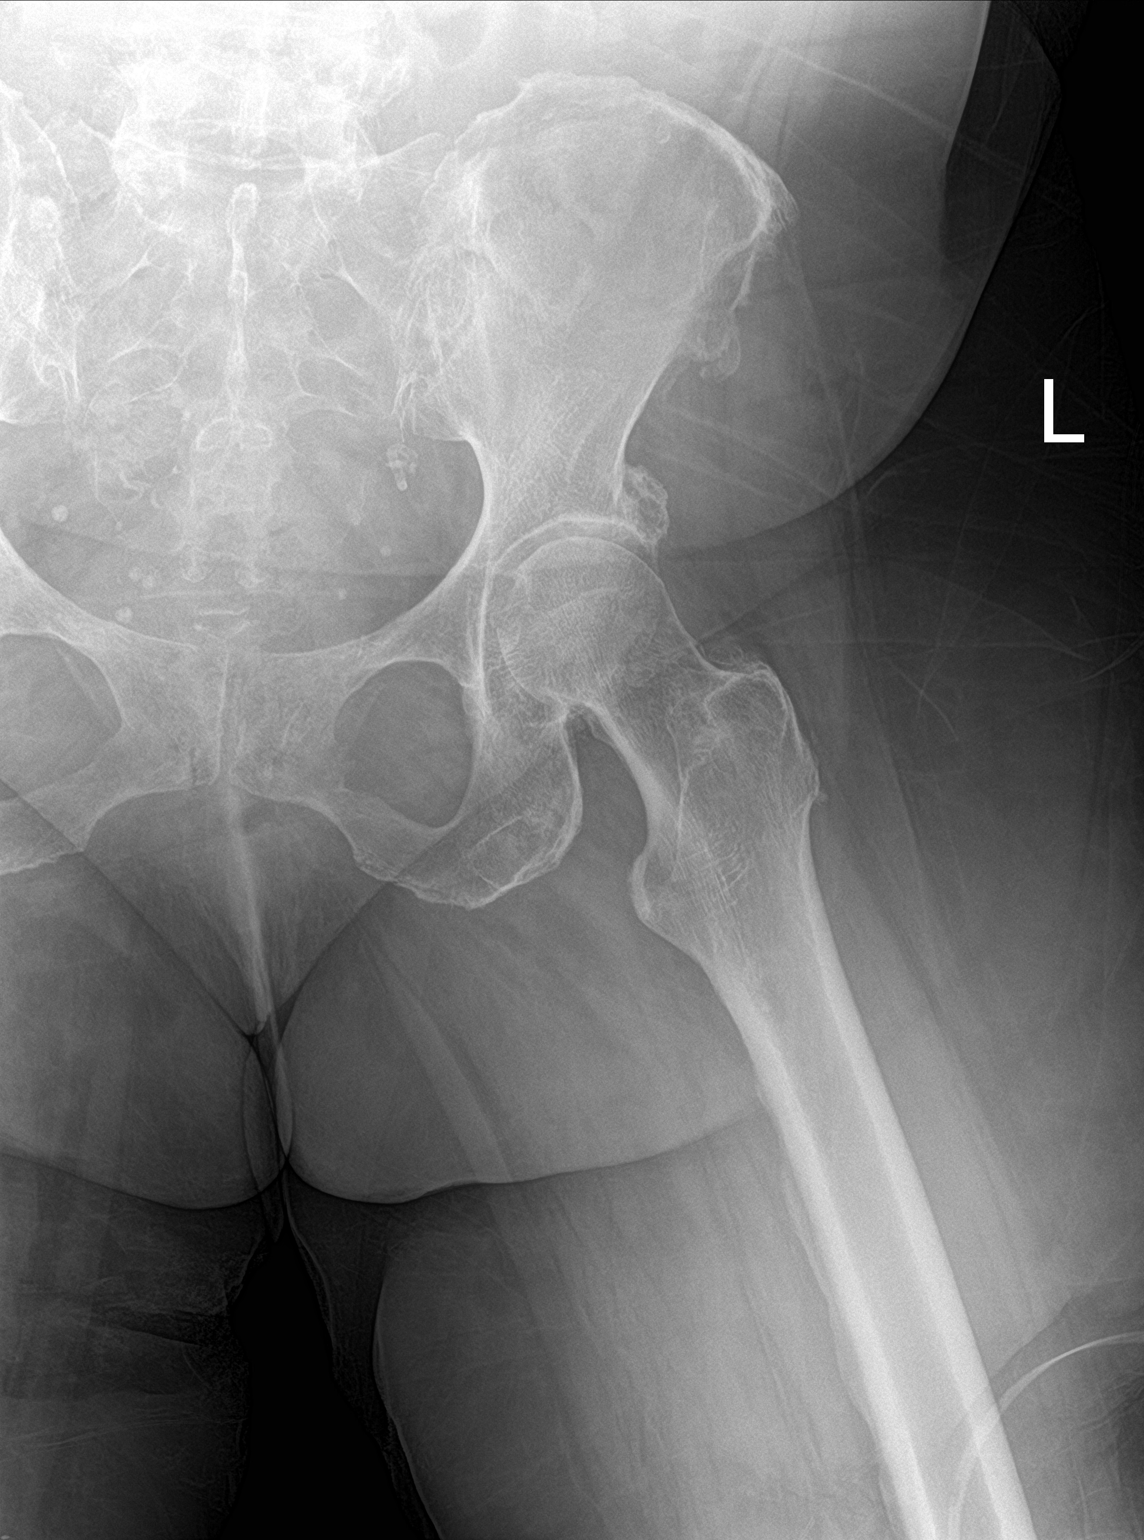

[3 of 3 positions shown; findings below may reference images not displayed]

FINDINGS: There is no evidence of hip fracture or dislocation. There is no
evidence of arthropathy or other focal bone abnormality.
Osteophytosis of the hip joints.
IMPRESSION: No evidence of hip fracture or dislocation.

Osteophytosis of the hip joints.

## 2023-05-20 IMAGING — CT CT HIP*L* W/O CM
2 of 4 series · 16 of 46 positions shown, 18 images · non-contrast
Comparison: Radiographs done earlier today

CLINICAL DATA: Trauma, fall

EXAM:
CT OF THE LEFT HIP WITHOUT CONTRAST
TECHNIQUE: Multidetector CT imaging of the left hip was performed according to
the standard protocol. Multiplanar CT image reconstructions were
also generated.

[Series 5: soft tissue · axial · 0.49mm/px · z∈[-272,-22]mm · 13 of 145 slices shown, 15 images]
[im 10/145  soft-tissue]
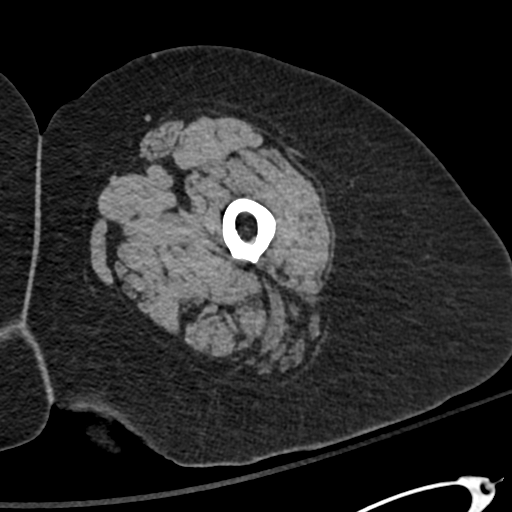
[im 10/145  bone]
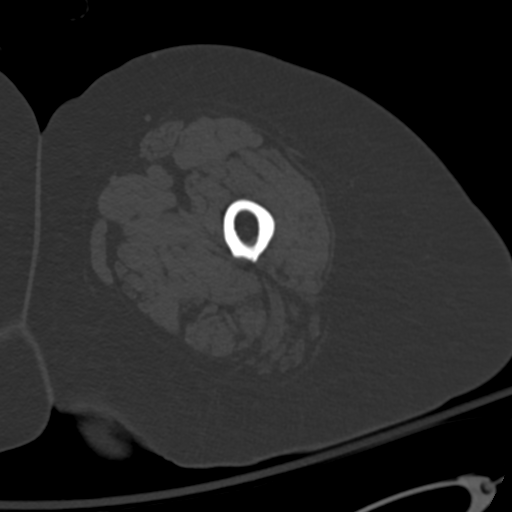
[im 19/145  soft-tissue]
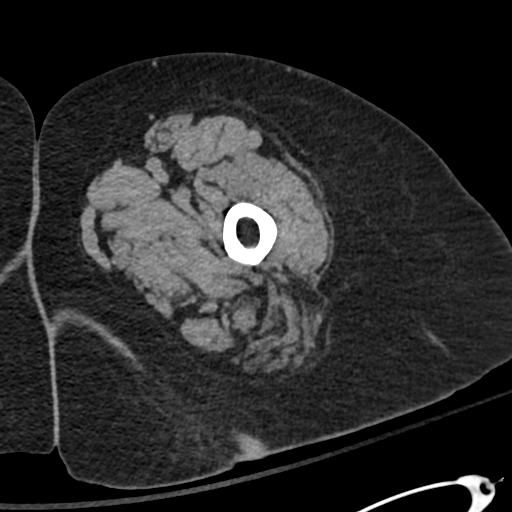
[im 28/145  soft-tissue]
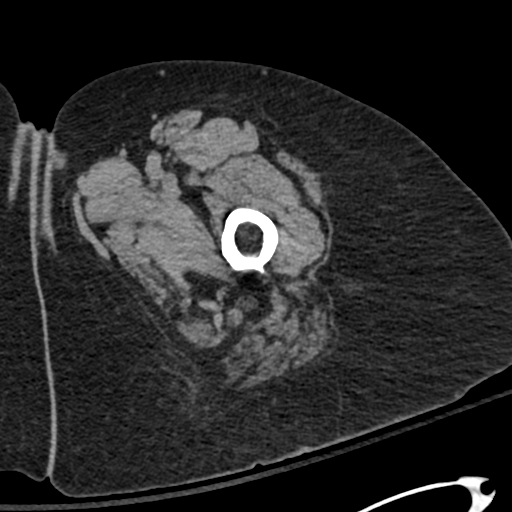
[im 42/145  soft-tissue]
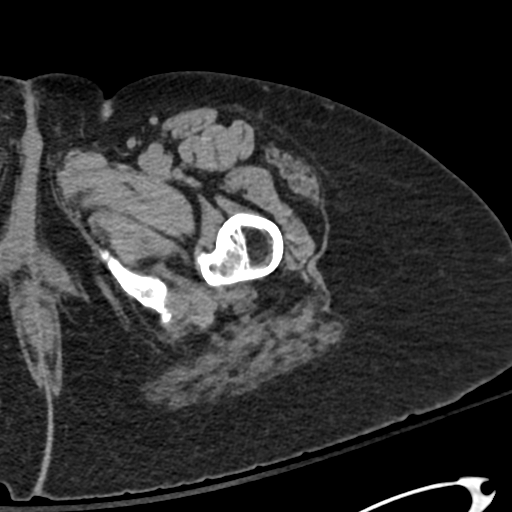
[im 52/145  soft-tissue]
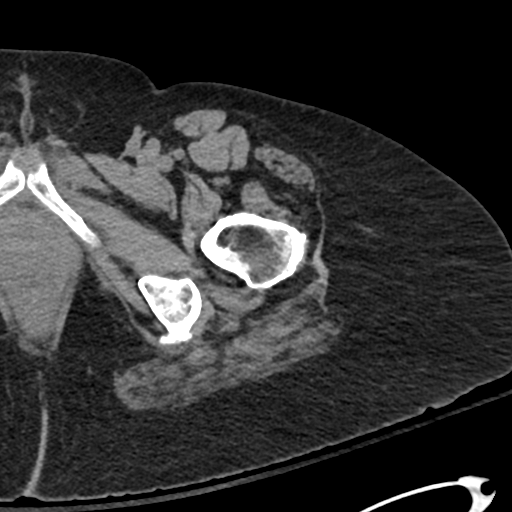
[im 61/145  soft-tissue]
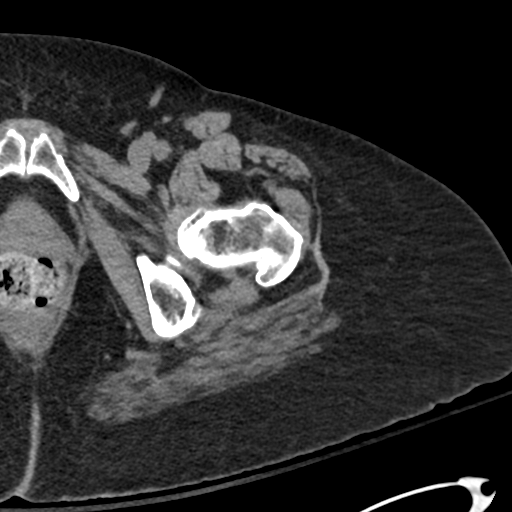
[im 75/145  soft-tissue]
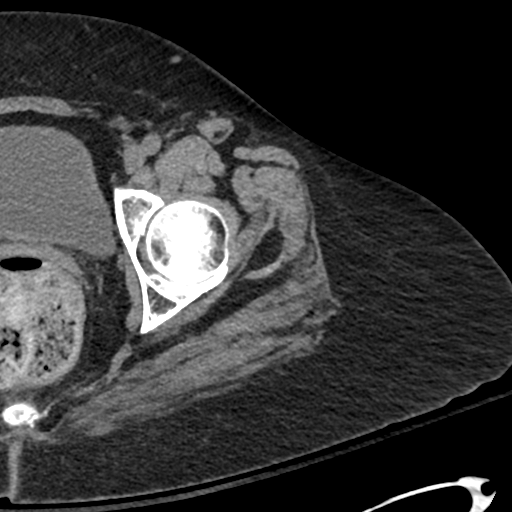
[im 84/145  soft-tissue]
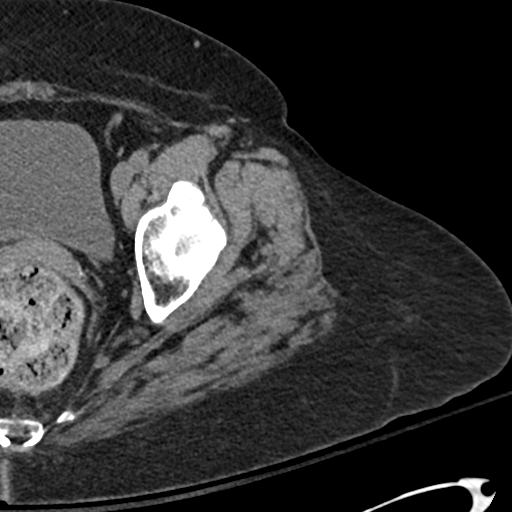
[im 93/145  soft-tissue]
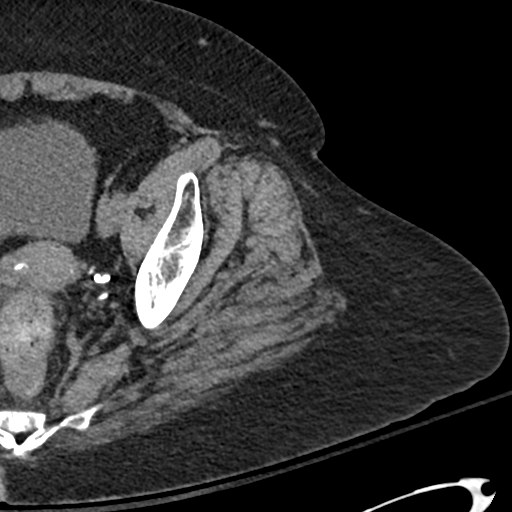
[im 93/145  bone]
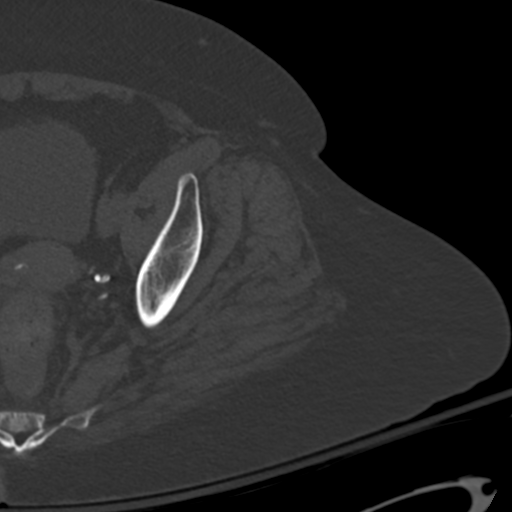
[im 103/145  soft-tissue]
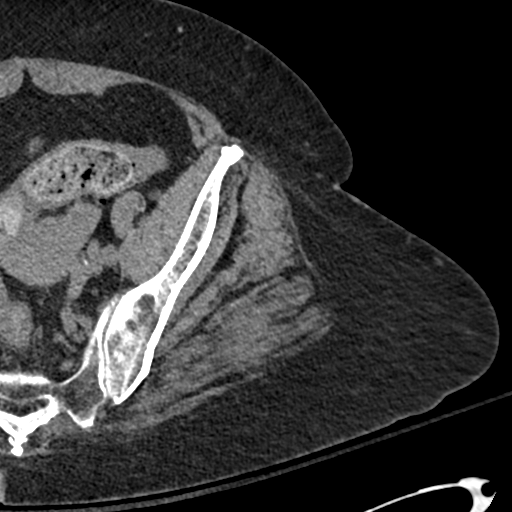
[im 117/145  soft-tissue]
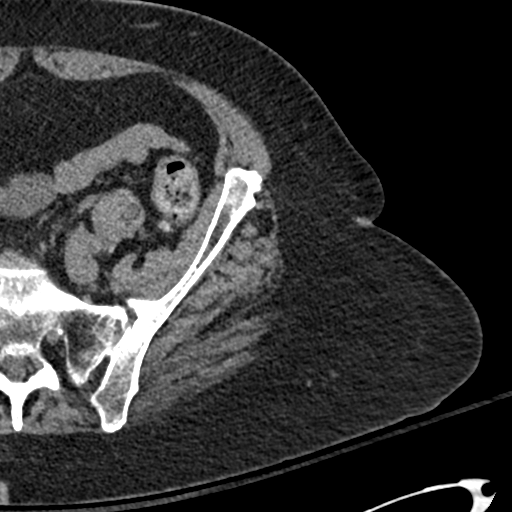
[im 126/145  soft-tissue]
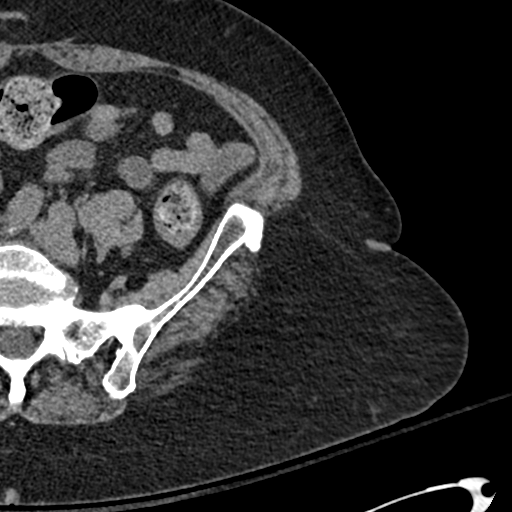
[im 135/145  soft-tissue]
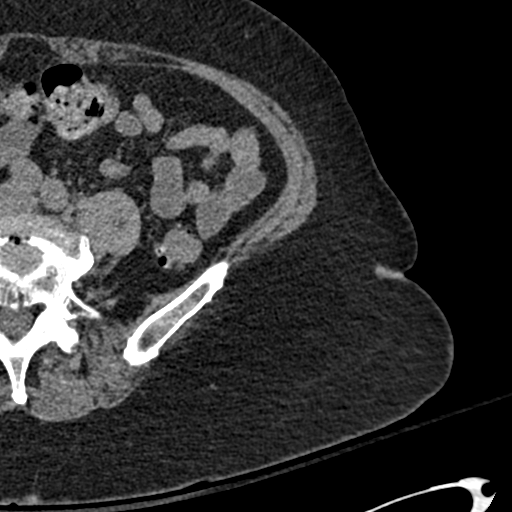

[Series 6: cor soft · coronal · 0.46mm/px · 3 of 114 slices shown]
[im 23/114  soft-tissue]
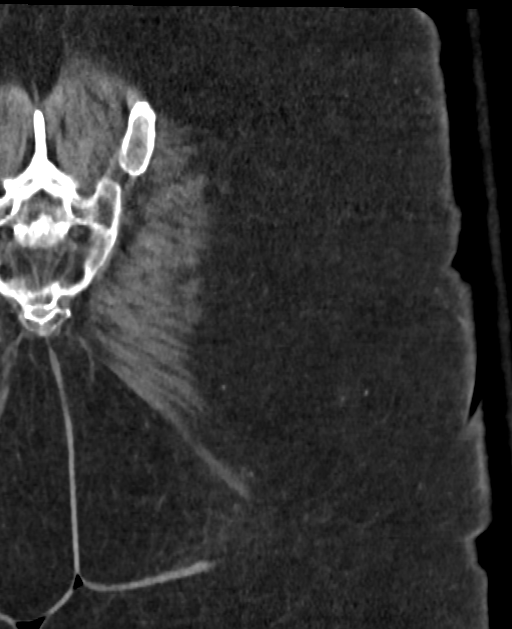
[im 46/114  soft-tissue]
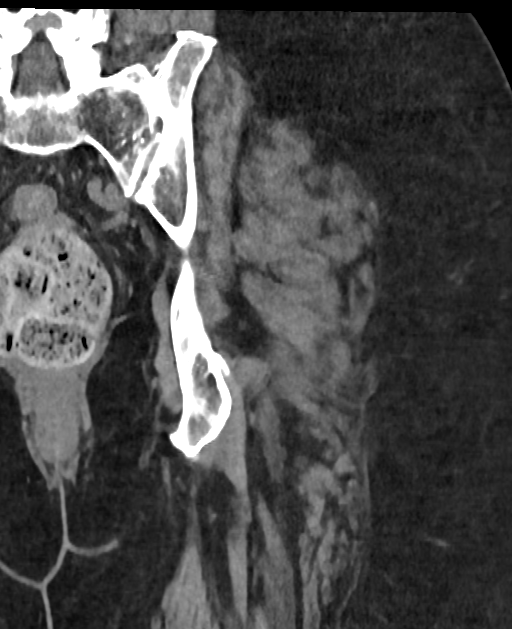
[im 68/114  soft-tissue]
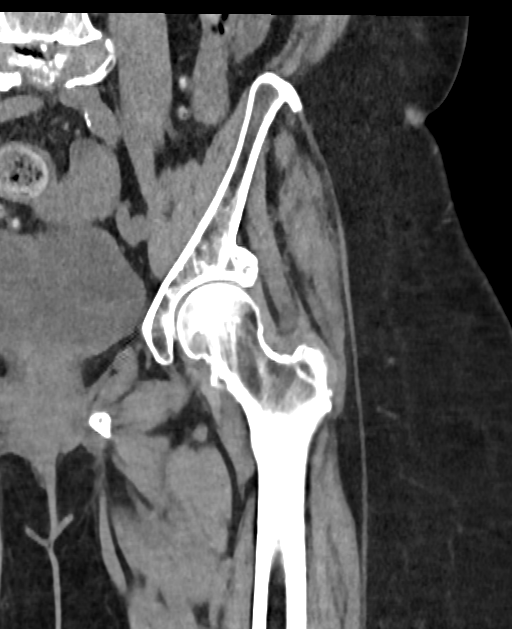

[16 of 46 positions shown; findings below may reference images not displayed]

FINDINGS: Bones/Joint/Cartilage

No recent fracture or dislocation is seen. Degenerative changes are
noted with bony spurs in the left hip. There is cortical thickening
in the medial margin of neck of left femur without definite fracture
line. There is no disruption of trabeculae in the head and neck of
proximal left humerus. Degenerative changes with bony spurs are
noted in the left hip joint. There is decrease in height of upper
endplate of body of L5 vertebra. Diverticula are seen in the
visualized left colon.

Ligaments

Not adequately evaluated.

Muscles and Tendons

Unremarkable.

Soft tissues

There is no demonstrable soft tissue hematoma.
IMPRESSION: No recent fracture or dislocation is seen in the left hip.
Degenerative changes are noted in the left hip joint.

There is decrease in height of upper endplate of body of L5 vertebra
which may suggest recent or old compression. Diverticulosis of
colon.

## 2023-05-23 IMAGING — MR MR LUMBAR SPINE W/O CM
4 of 5 series · 30 of 48 positions shown · non-contrast
Comparison: MRI lumbar spine 06/18/2021.

CLINICAL DATA: Compression fracture, lumbar

EXAM:
MRI LUMBAR SPINE WITHOUT CONTRAST
TECHNIQUE: Multiplanar, multisequence MR imaging of the lumbar spine was
performed. No intravenous contrast was administered.

[Series 5: T1 · sagittal · 4.0mm · 0.81mm/px · 6 of 15 slices shown (1 of 2)]
[im 1/15]
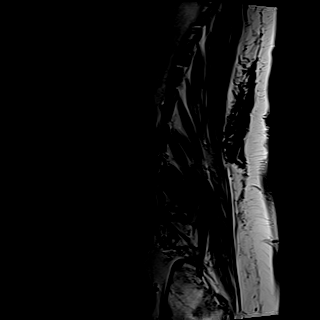
[im 3/15]
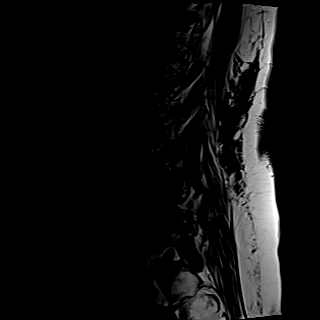
[im 6/15]
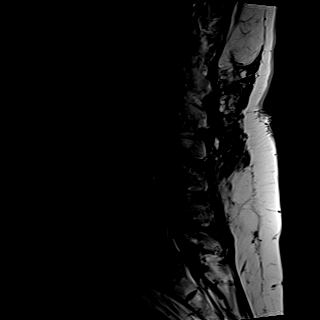
[im 9/15]
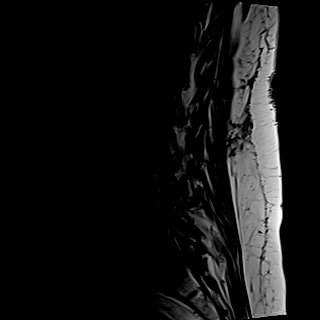
[im 12/15]
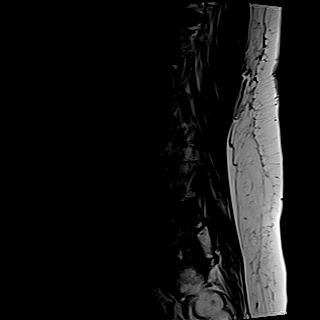
[im 15/15]
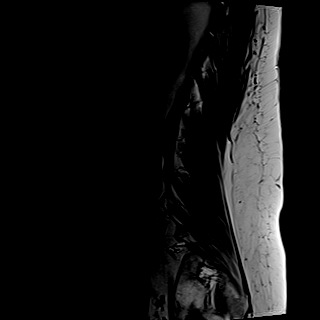

[Series 6: T2 · sagittal · 4.0mm · 0.81mm/px · 6 of 15 slices shown (1 of 2)]
[im 1/15]
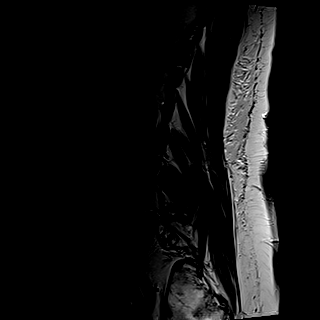
[im 3/15]
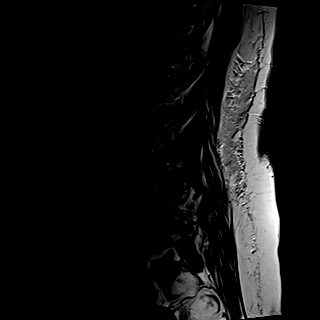
[im 6/15]
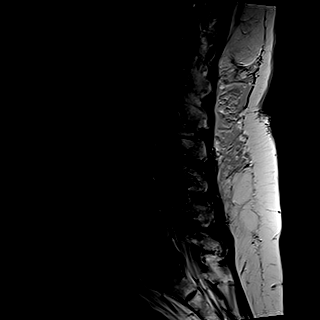
[im 9/15]
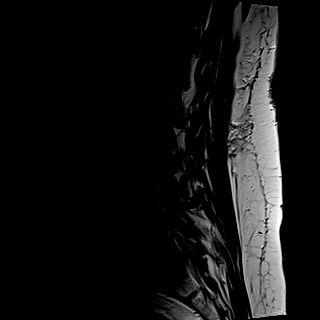
[im 12/15]
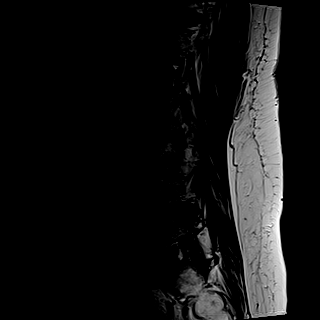
[im 15/15]
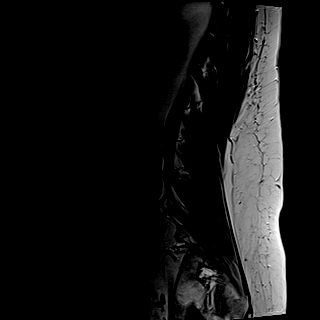

[Series 8: T2 · axial · 4.0mm · 0.62mm/px · z∈[-26,+193]mm · 9 of 41 slices shown (2 of 2)]
[im 1/41]
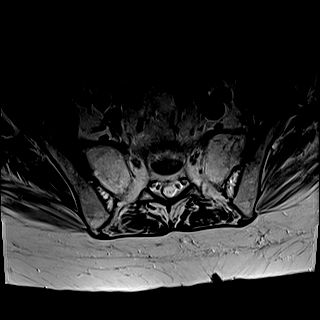
[im 6/41]
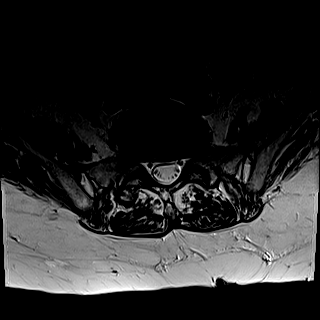
[im 12/41]
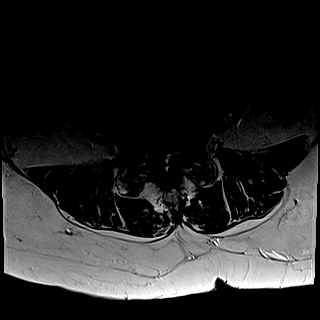
[im 18/41]
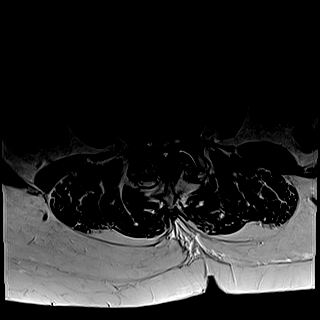
[im 21/41]
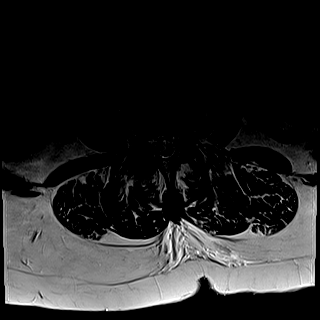
[im 23/41]
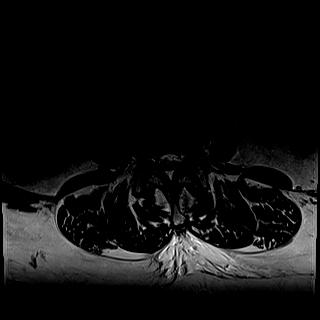
[im 29/41]
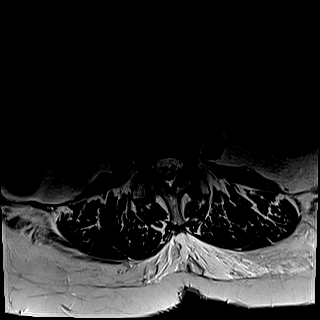
[im 35/41]
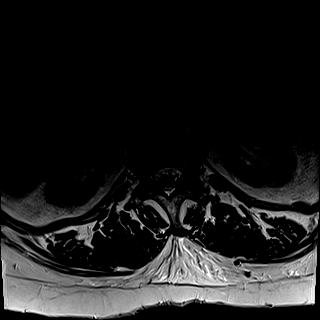
[im 41/41]
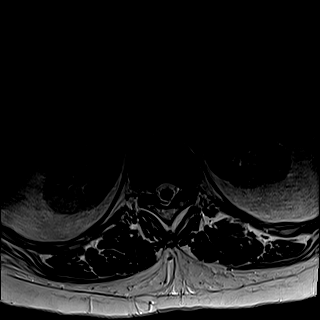

[Series 9: T1 · axial · 4.0mm · 0.39mm/px · z∈[-26,+193]mm · 9 of 41 slices shown (2 of 2)]
[im 1/41]
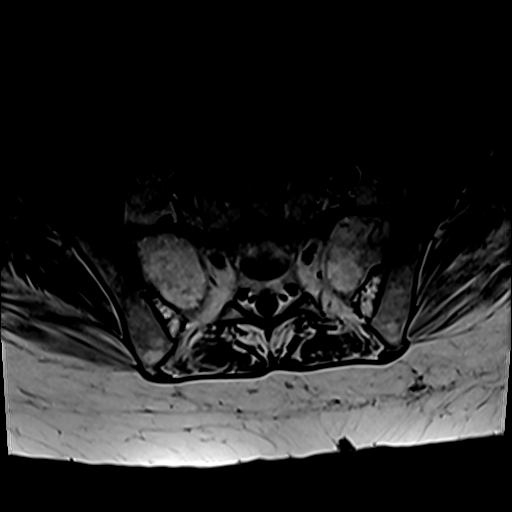
[im 6/41]
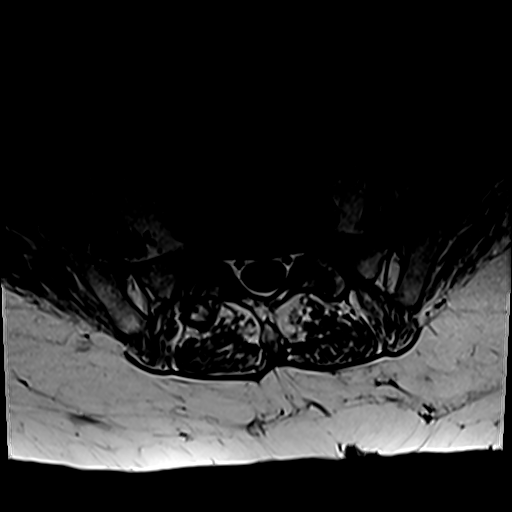
[im 12/41]
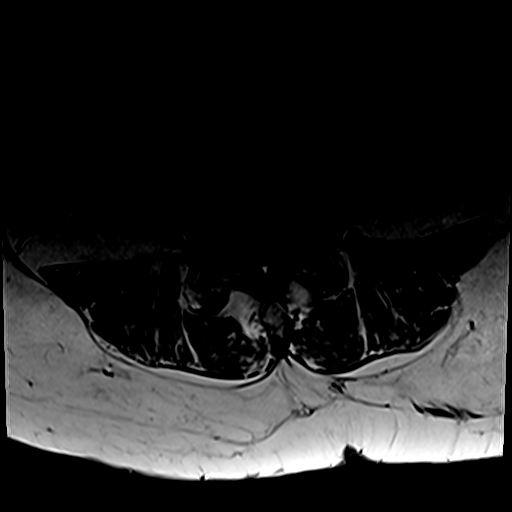
[im 18/41]
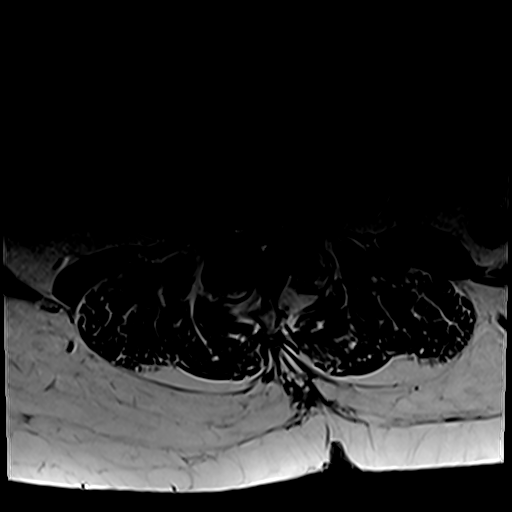
[im 21/41]
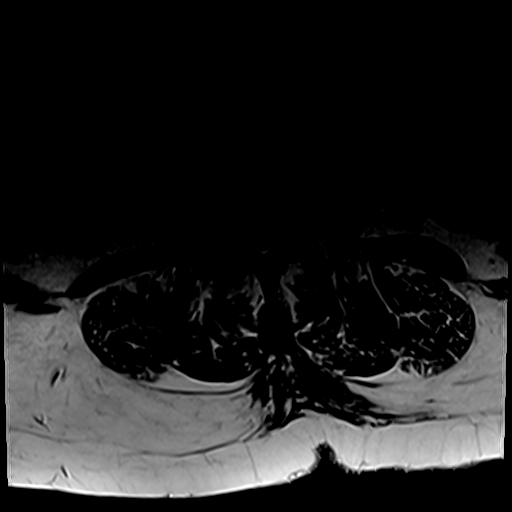
[im 23/41]
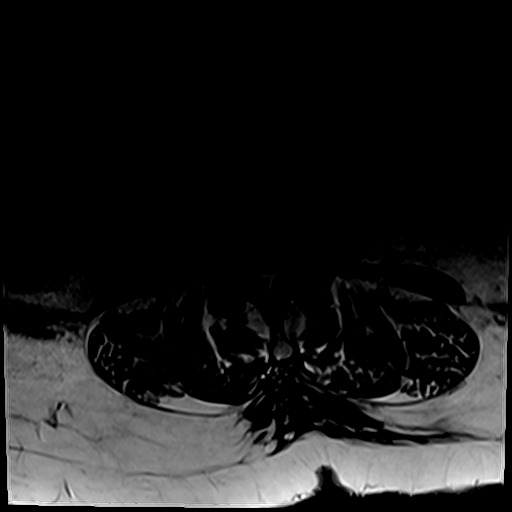
[im 29/41]
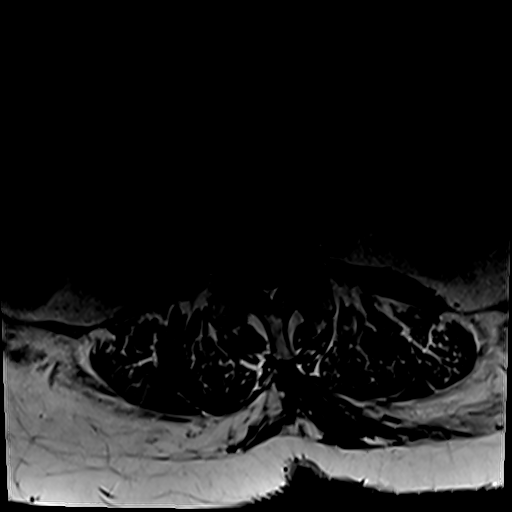
[im 35/41]
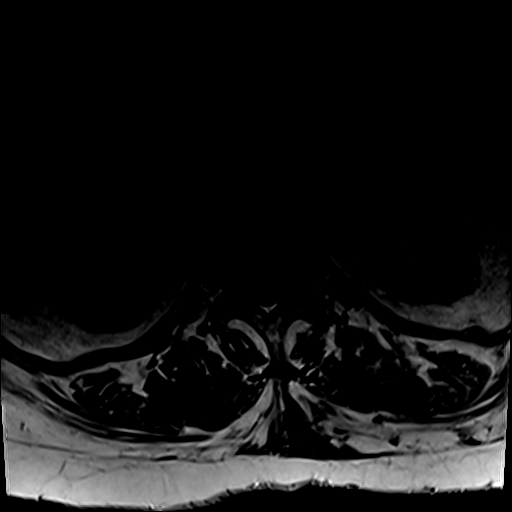
[im 41/41]
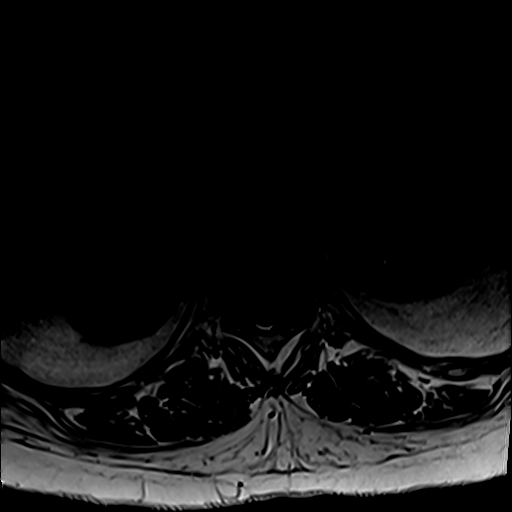

[30 of 48 positions shown; findings below may reference images not displayed]

FINDINGS: Segmentation:  Standard.

Alignment:  Similar grade 1 anterolisthesis of L4 on L5.

Vertebrae: Chronic L5 compression fracture with similar height loss
(approximately 40%). No marrow edema.

Conus medullaris and cauda equina: Conus extends to the L1-L2 level.
Conus appears normal.

Paraspinal and other soft tissues: L4-L5 perifacet
edema/inflammatory change. Unremarkable. Right renal cyst.

Disc levels:

T12-L1: No significant disc protrusion, foraminal stenosis, or canal
stenosis.

L1-L2: No significant disc protrusion, foraminal stenosis, or canal
stenosis.

L2-L3: Mild disc bulge and mildly prominent dorsal epidural fat.
Resulting mild canal and bilateral foraminal stenosis, similar.

L3-L4: Mild disc bulge, ligamentum flavum thickening and bilateral
facet arthropathy. Mildly prominent dorsal epidural fat. Resulting
moderate canal and bilateral foraminal stenosis, similar.

L4-L5: Grade 1 anterolisthesis of L4 on L5, similar. Uncovering of
the disc with superimposed broad disc bulge, ligamentum flavum
thickening and bilateral facet hypertrophy. Resulting severe canal
stenosis and severe bilateral subarticular recess narrowing. Similar
severe left greater than right foraminal stenosis with left
foraminal/extraforaminal disc impinging the exiting/exited left
nerve. Small bilateral facet joint effusions with perifacet edema.

L5-S1: Mild disc bulge with superimposed central annular fissure,
similar. Bilateral facet arthropathy without significant stenosis.
IMPRESSION: 1. Chronic L5 compression fracture with similar 40% height loss.
2. At L4-L5, similar severe canal, subarticular recess, and
bilateral foraminal stenosis with impingement of the exiting left L4
nerve.
3. At L3-L4, mild canal and bilateral foraminal stenosis, similar.

## 2023-05-23 IMAGING — CR DG FEMUR 2+V*L*
4 series · 4 of 4 positions shown · non-contrast
Comparison: None.

CLINICAL DATA: Left hip pain

EXAM:
LEFT FEMUR 2 VIEWS

[x femur proximal ap left (1 of 2)]
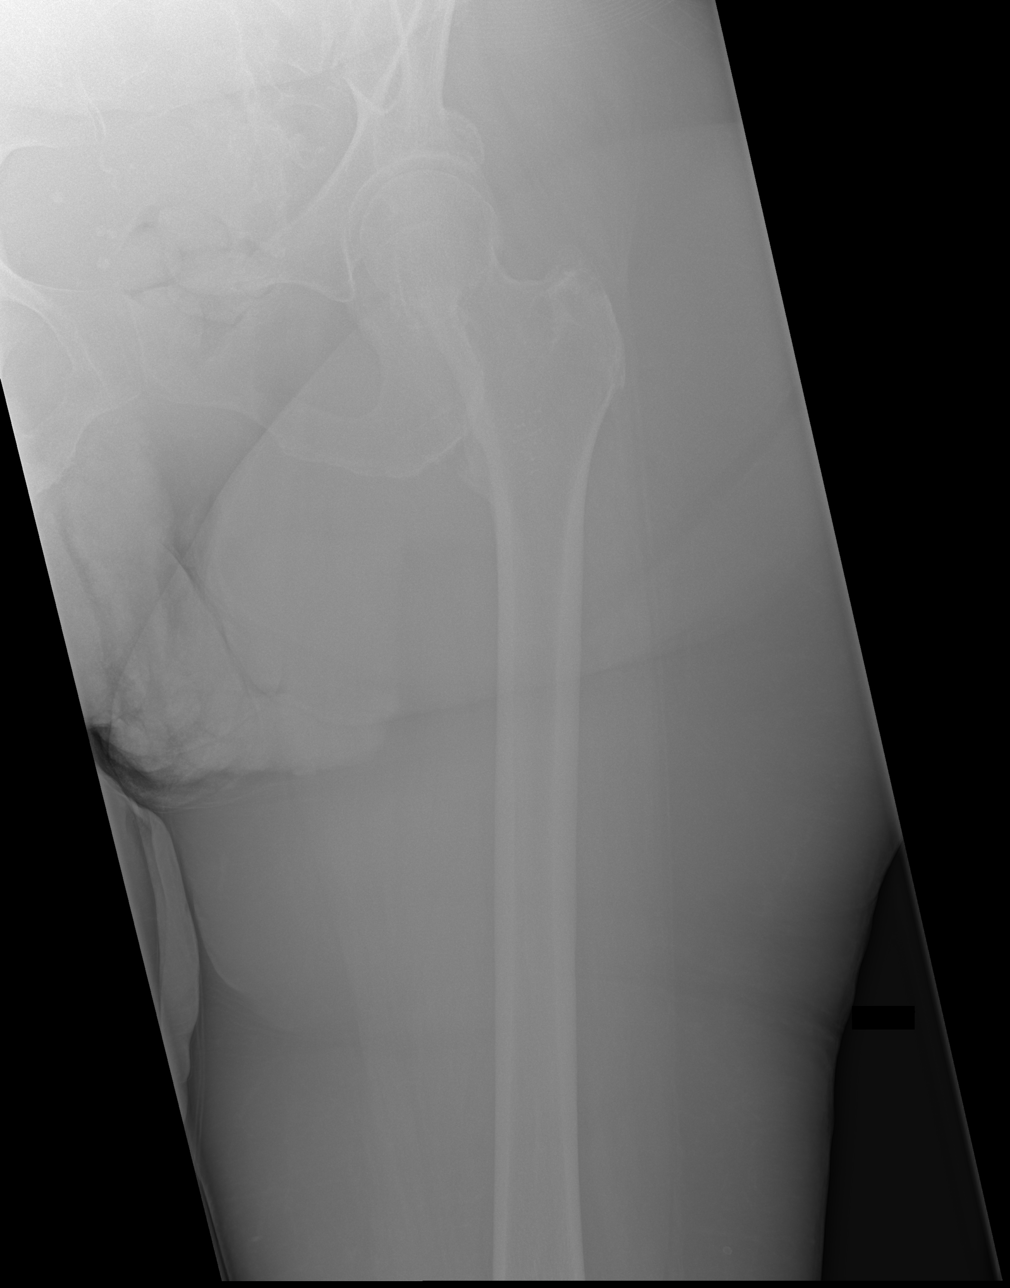

[x femur proximal ap left (2 of 2)]
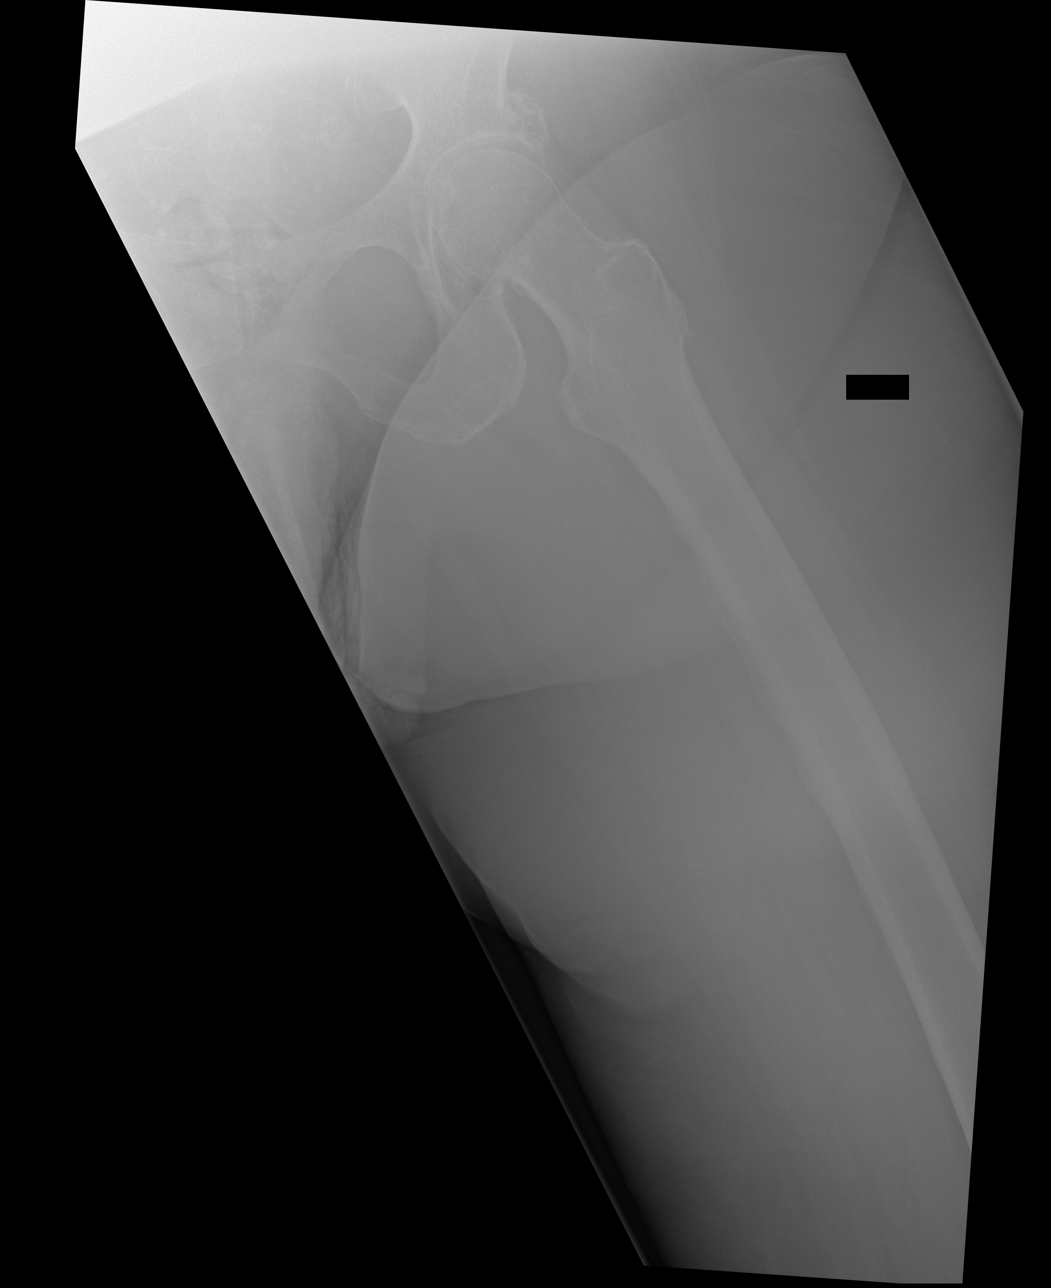

[x femur distal ap left (1 of 2)]
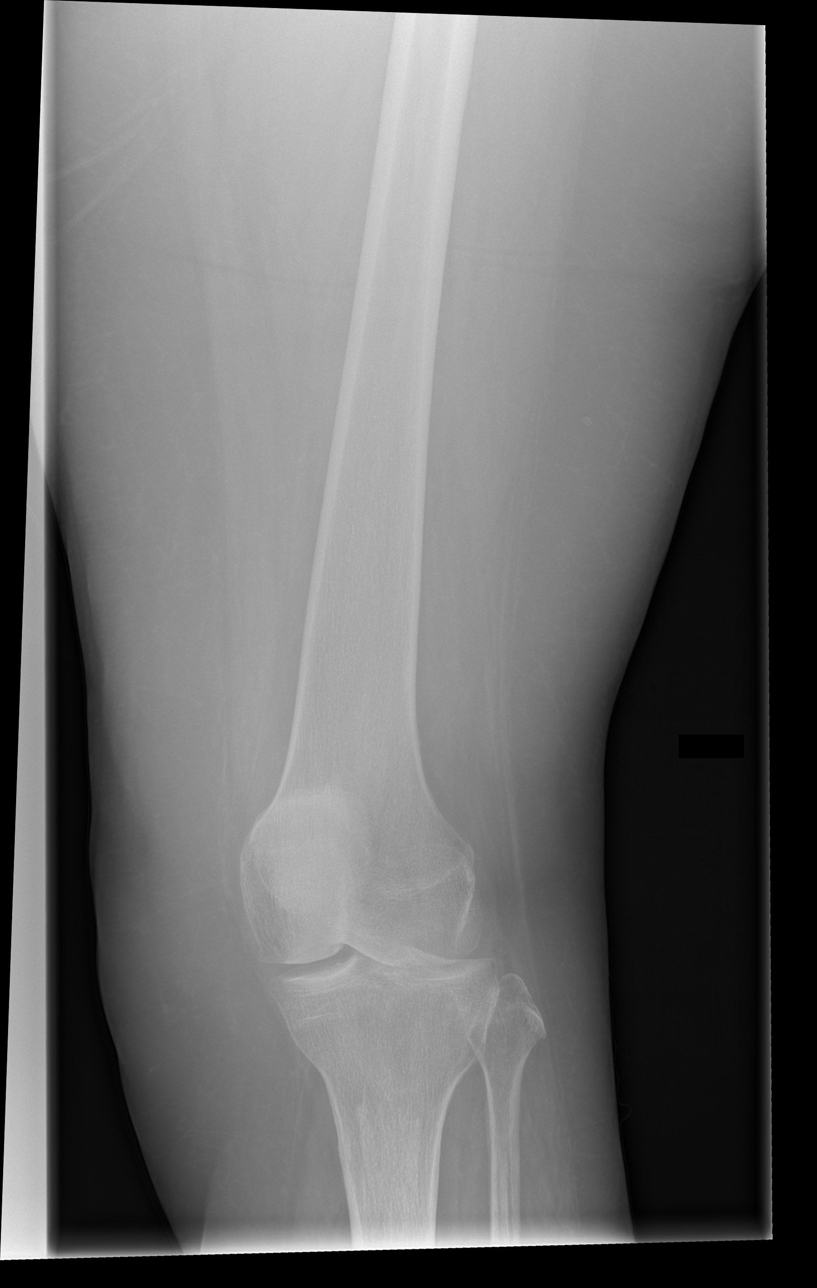

[x femur distal ap left (2 of 2)]
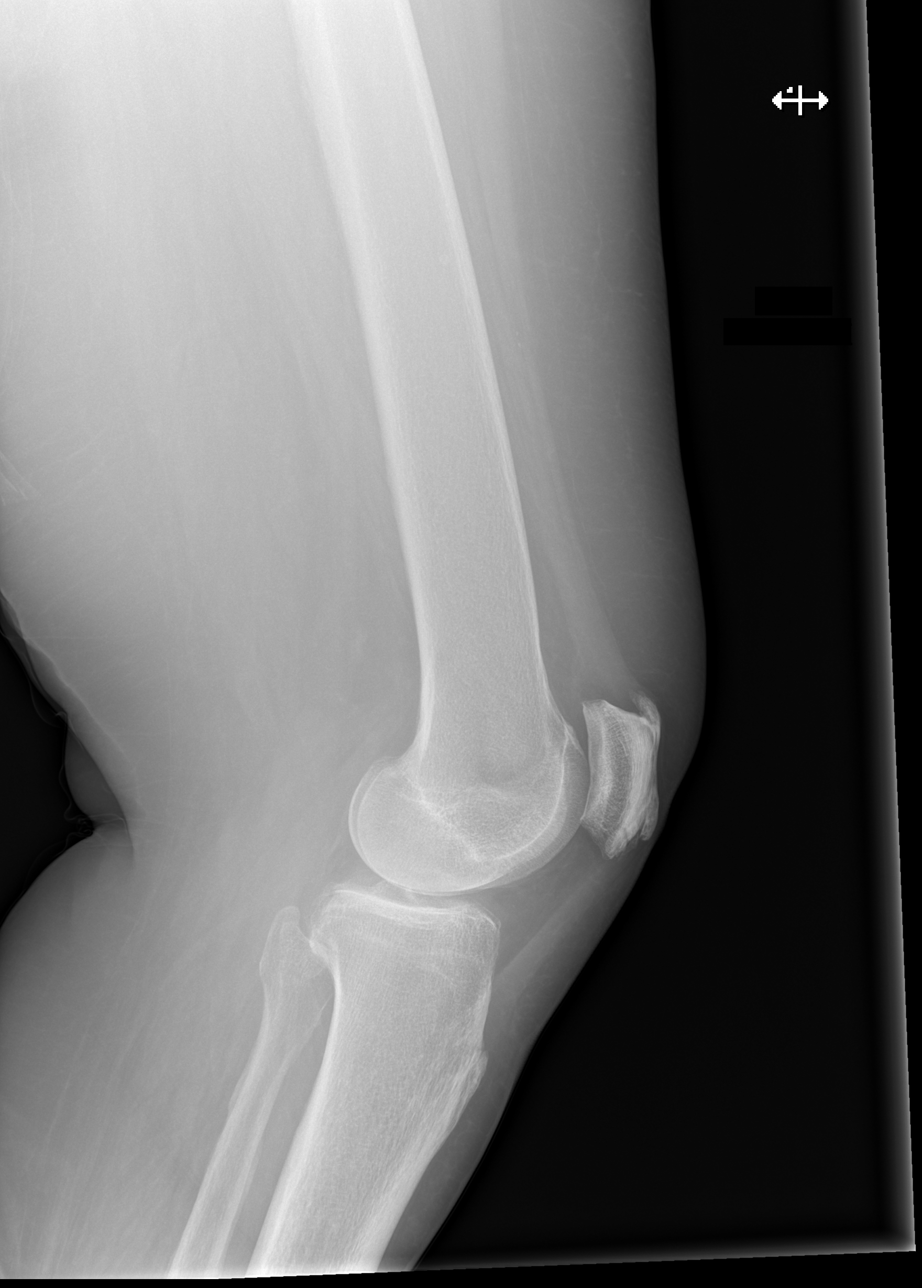

[4 of 4 positions shown; findings below may reference images not displayed]

FINDINGS: There is no evidence of fracture or other focal bone lesions. Soft
tissues are unremarkable.
IMPRESSION: Negative.

## 2023-05-23 IMAGING — MR MR FEMUR*L* W/O CM
5 series · 40 of 40 positions shown · non-contrast
Comparison: Radiographs 10/22/2021

CLINICAL DATA: Upper leg pain

EXAM:
MR OF THE LEFT FEMUR WITHOUT CONTRAST
TECHNIQUE: Multiplanar, multisequence MR imaging of the left femur was
performed. No intravenous contrast was administered.

[Series 3: T1 · coronal · left · 4.0mm · 1.95mm/px · 7 of 38 slices shown (1 of 2)]
[im 1/38]
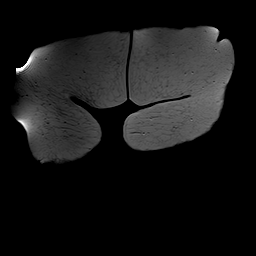
[im 7/38]
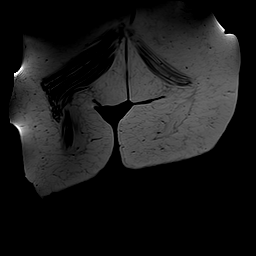
[im 13/38]
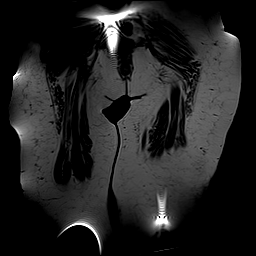
[im 19/38]
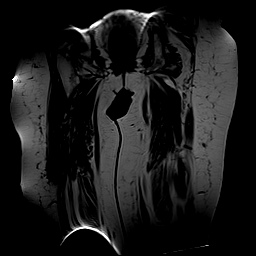
[im 25/38]
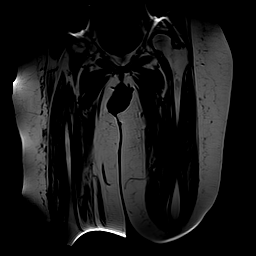
[im 31/38]
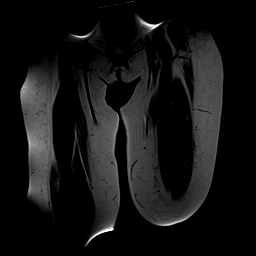
[im 38/38]
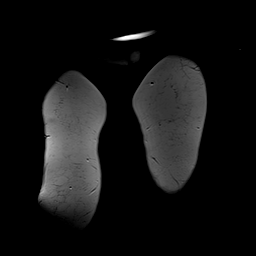

[Series 4: STIR · coronal · left · 4.0mm · 1.95mm/px · 6 of 38 slices shown (1 of 2)]
[im 1/38]
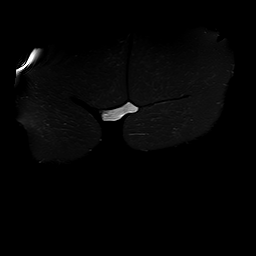
[im 8/38]
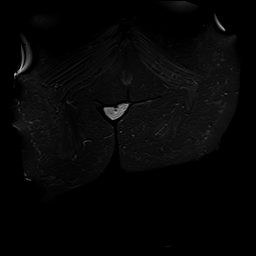
[im 15/38]
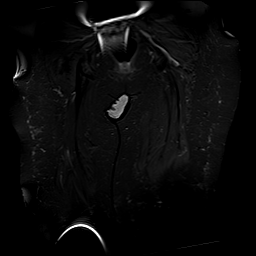
[im 23/38]
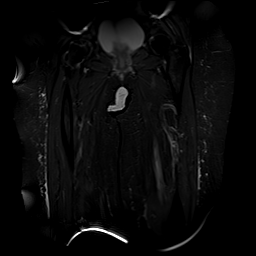
[im 30/38]
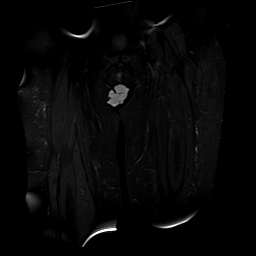
[im 38/38]
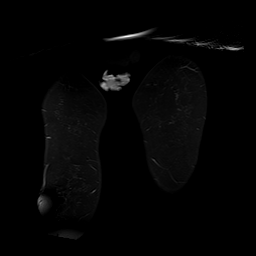

[Series 5: T1 · axial · left · 6.0mm · 1.02mm/px · z∈[-241,+185]mm · 10 of 60 slices shown (2 of 2)]
[im 1/60]
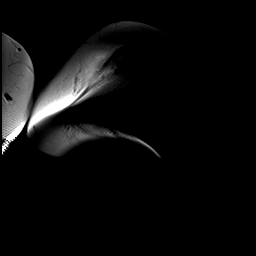
[im 7/60]
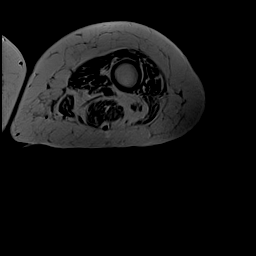
[im 14/60]
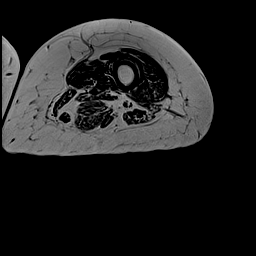
[im 20/60]
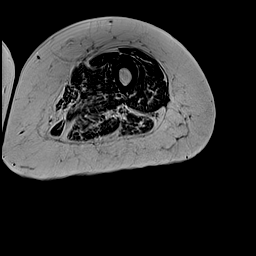
[im 27/60]
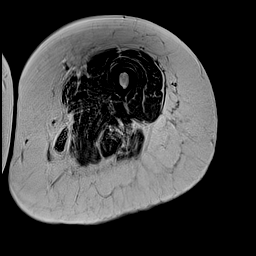
[im 33/60]
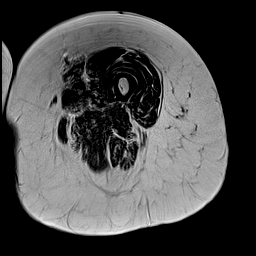
[im 40/60]
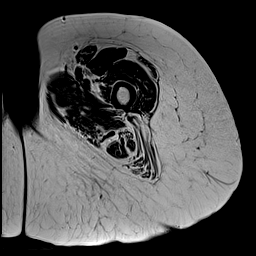
[im 46/60]
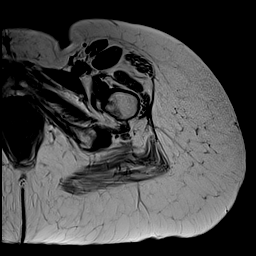
[im 53/60]
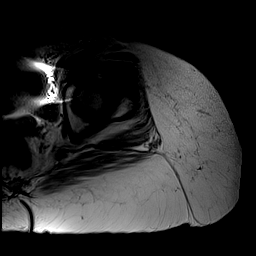
[im 60/60]
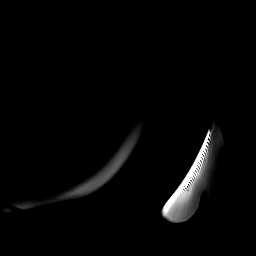

[Series 6: T2 fat-sat · axial · left · 6.0mm · 1.02mm/px · z∈[-241,+185]mm · 10 of 60 slices shown]
[im 1/60]
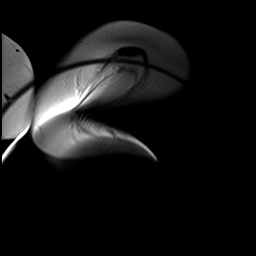
[im 7/60]
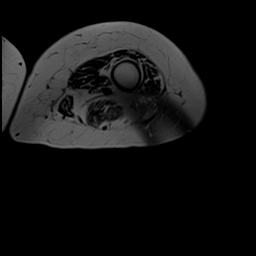
[im 14/60]
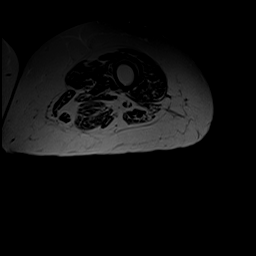
[im 20/60]
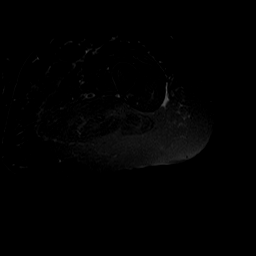
[im 27/60]
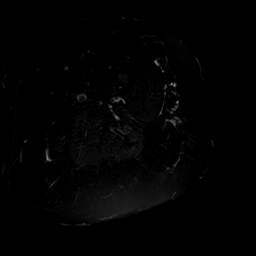
[im 33/60]
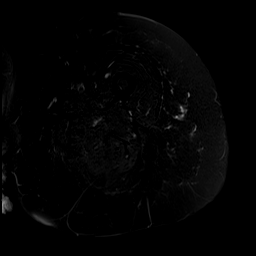
[im 40/60]
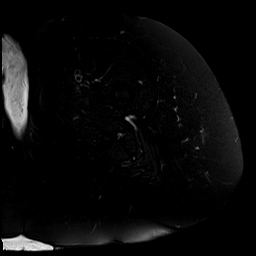
[im 46/60]
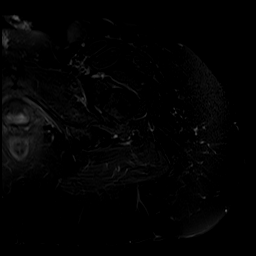
[im 53/60]
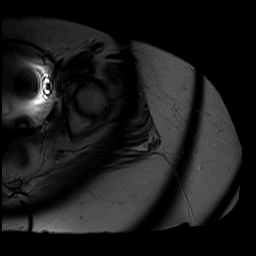
[im 60/60]
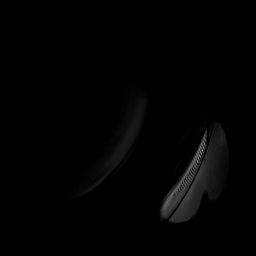

[Series 7: STIR · sagittal · left · 4.0mm · 1.72mm/px · 7 of 39 slices shown (2 of 2)]
[im 1/39]
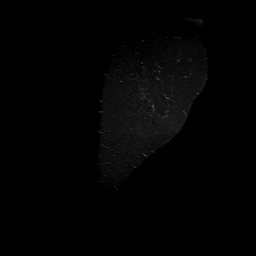
[im 7/39]
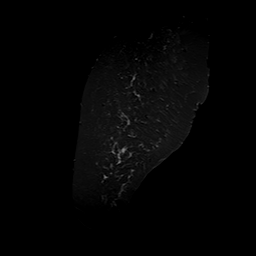
[im 13/39]
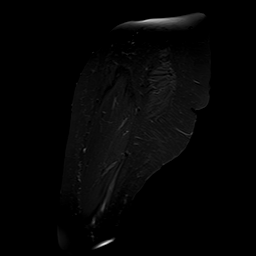
[im 20/39]
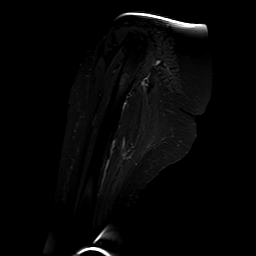
[im 26/39]
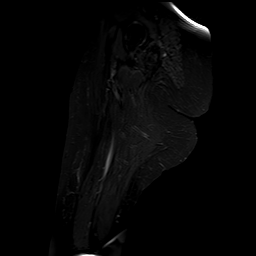
[im 32/39]
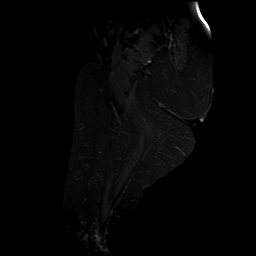
[im 39/39]
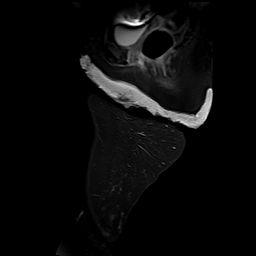

[40 of 40 positions shown; findings below may reference images not displayed]

FINDINGS: Bones/Joint/Cartilage

The left femur is characterized down to the distal metaphyseal
level, the knee areas obscured by boundary artifact.

No left femoral fracture or periosteal or marrow edema is identified
to indicate a stress fracture. There is some mild spurring of the
left femoral head and acetabulum.

There is suspected revascularized avascular necrosis of the
contralateral (right) femoral head superiorly for example on image
24 series 3. The right hip was not the focus of today's exam.

Ligaments

N/A

Muscles and Tendons

Unremarkable

Soft tissues

Unremarkable
IMPRESSION: 1. No findings of fracture or stress fracture involving the left
femur down to the level of the distal metaphysis. The knee area is
obscured by boundary artifact.
2. Mild degenerative spurring of the left femoral head and
acetabulum.
3. Suspected chronic revascularized avascular necrosis of the
contralateral (right) femoral head.

## 2023-06-25 IMAGING — CR DG HIP (WITH OR WITHOUT PELVIS) 2-3V*L*
3 series · 3 of 3 positions shown · non-contrast
Comparison: October 22, 2021

CLINICAL DATA: fall

EXAM:
DG HIP (WITH OR WITHOUT PELVIS) 2-3V LEFT; LEFT FEMUR 2 VIEWS

[x pelvis]
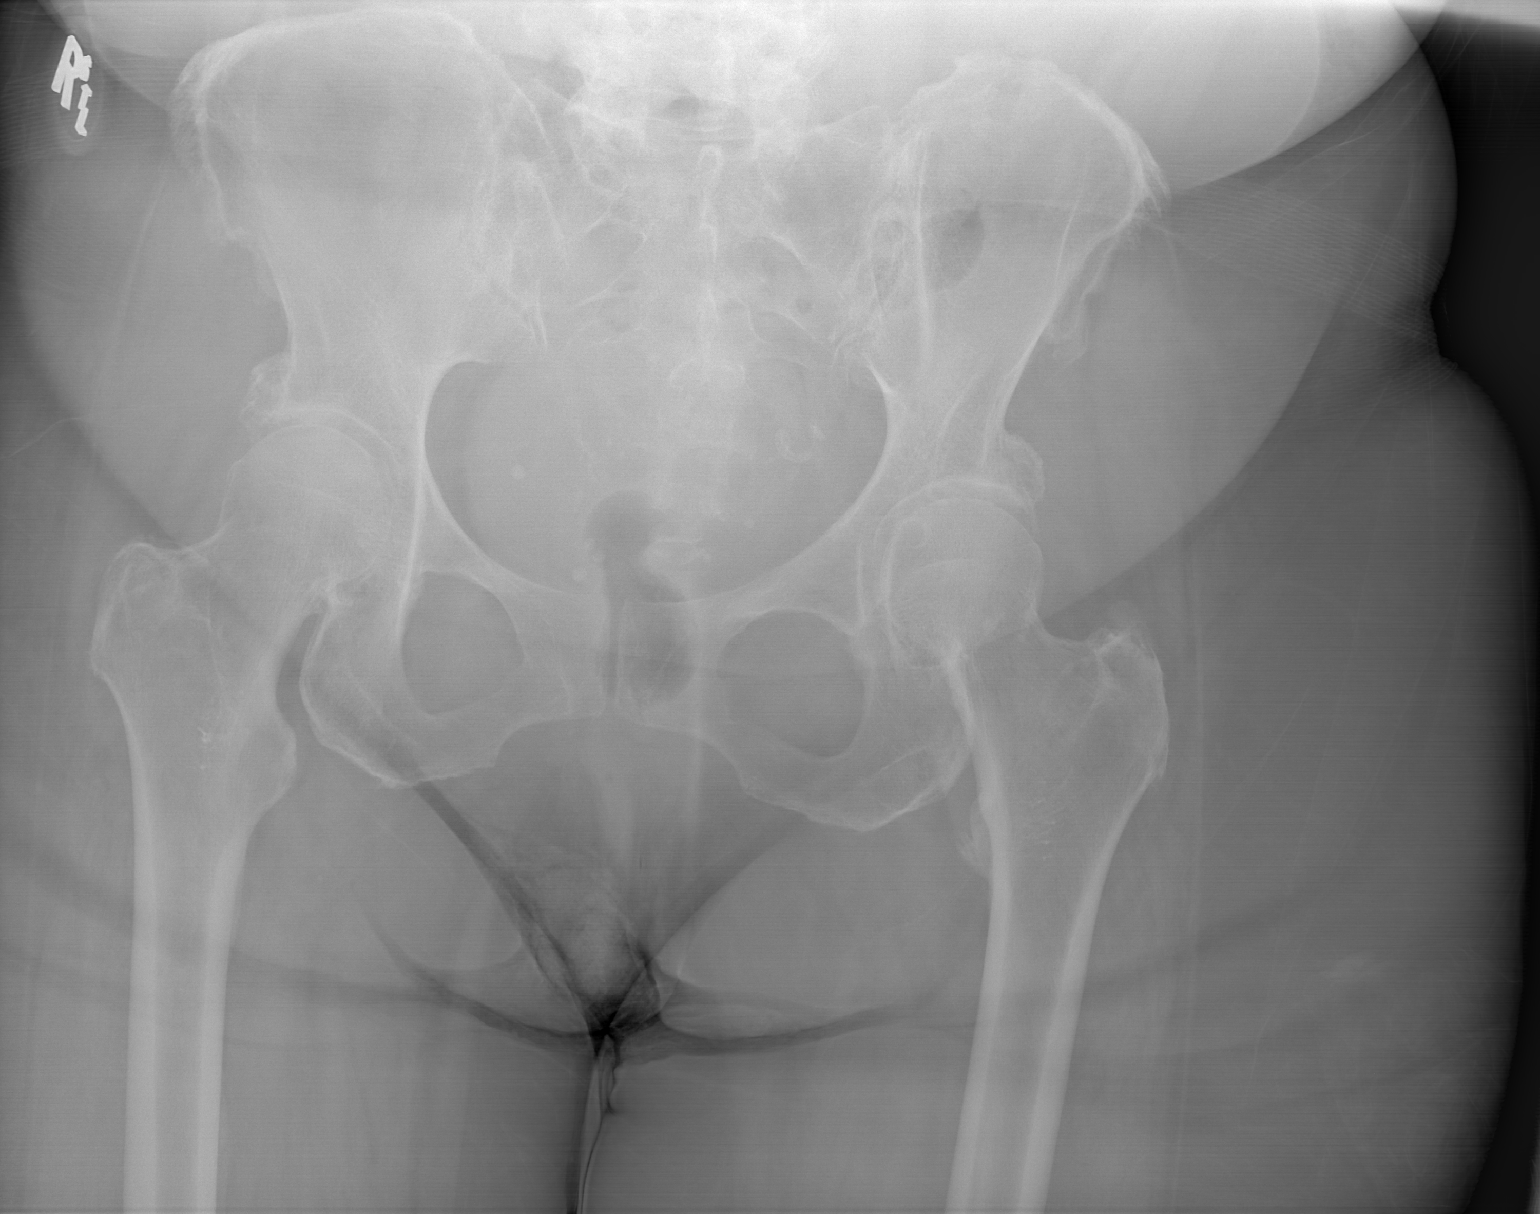

[x hip ap left]
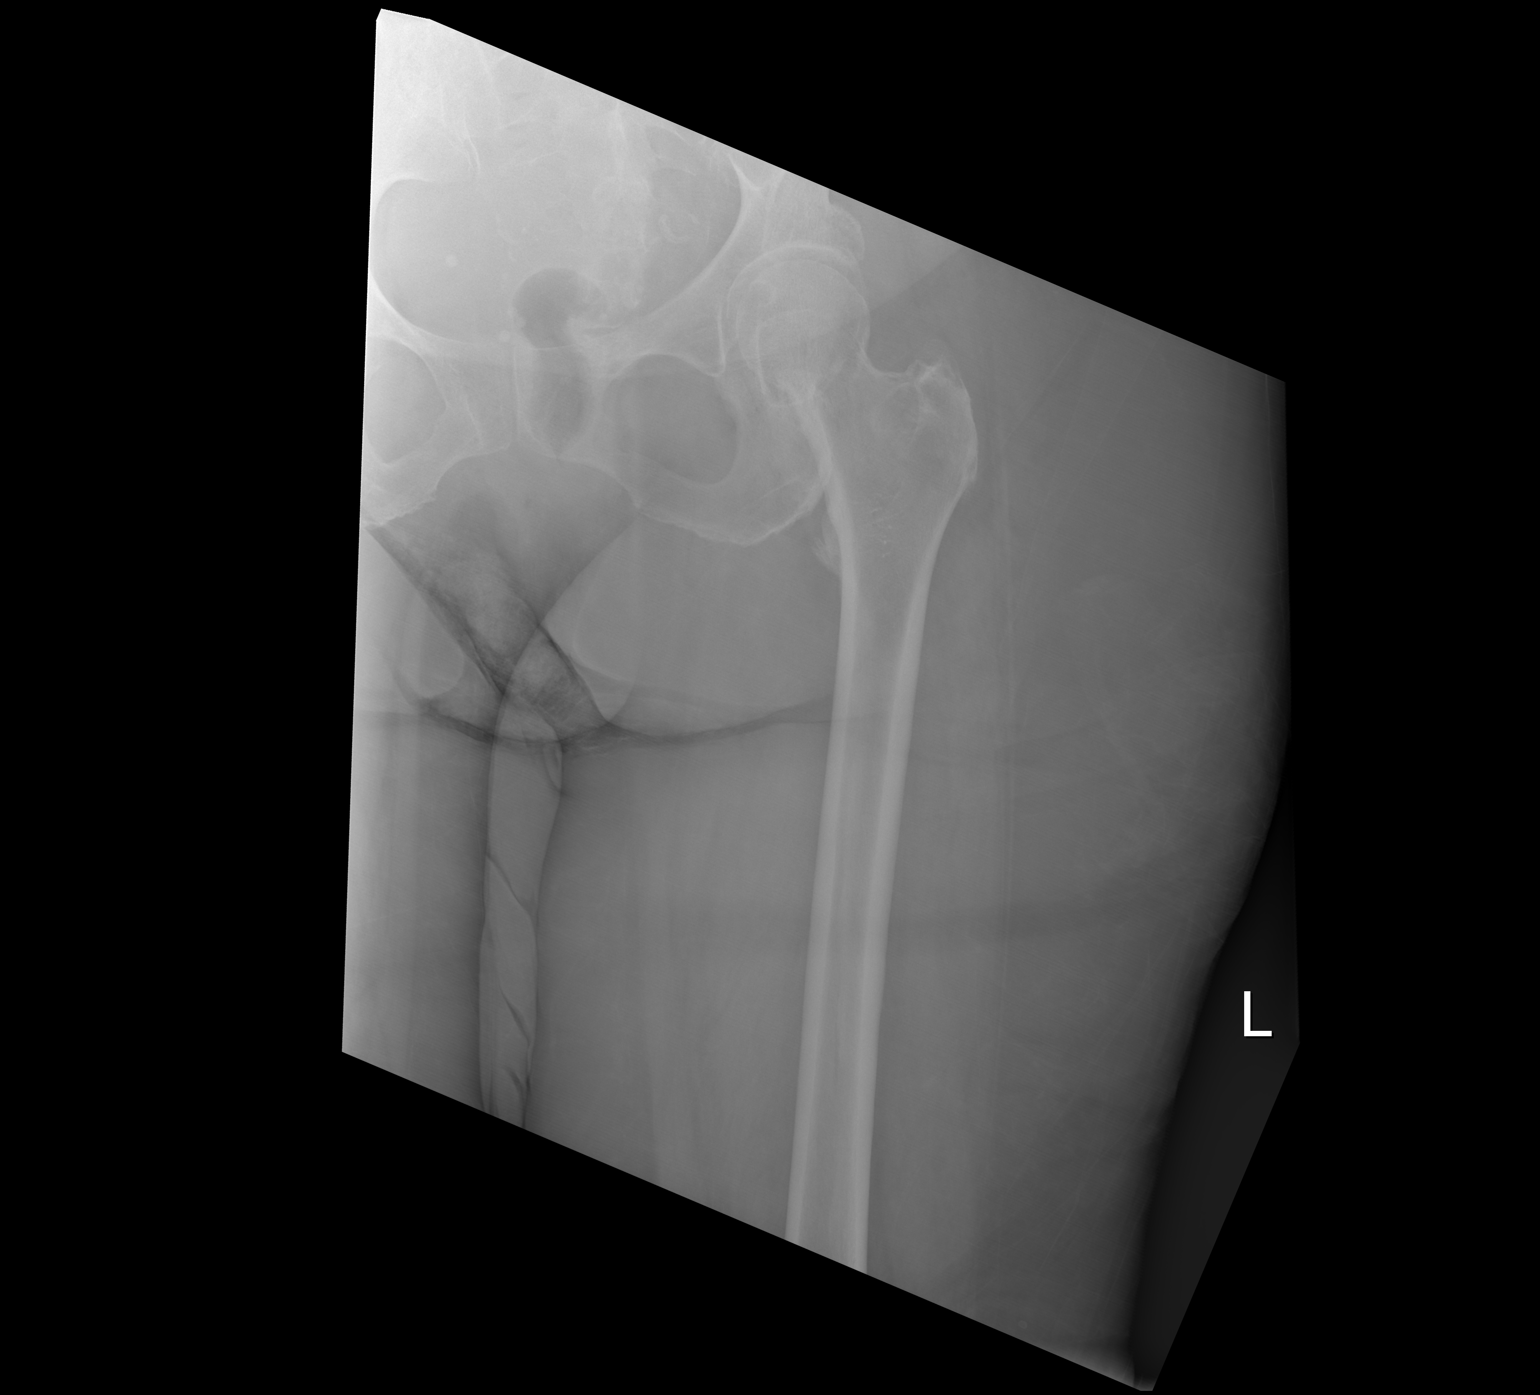

[x hip lat left]
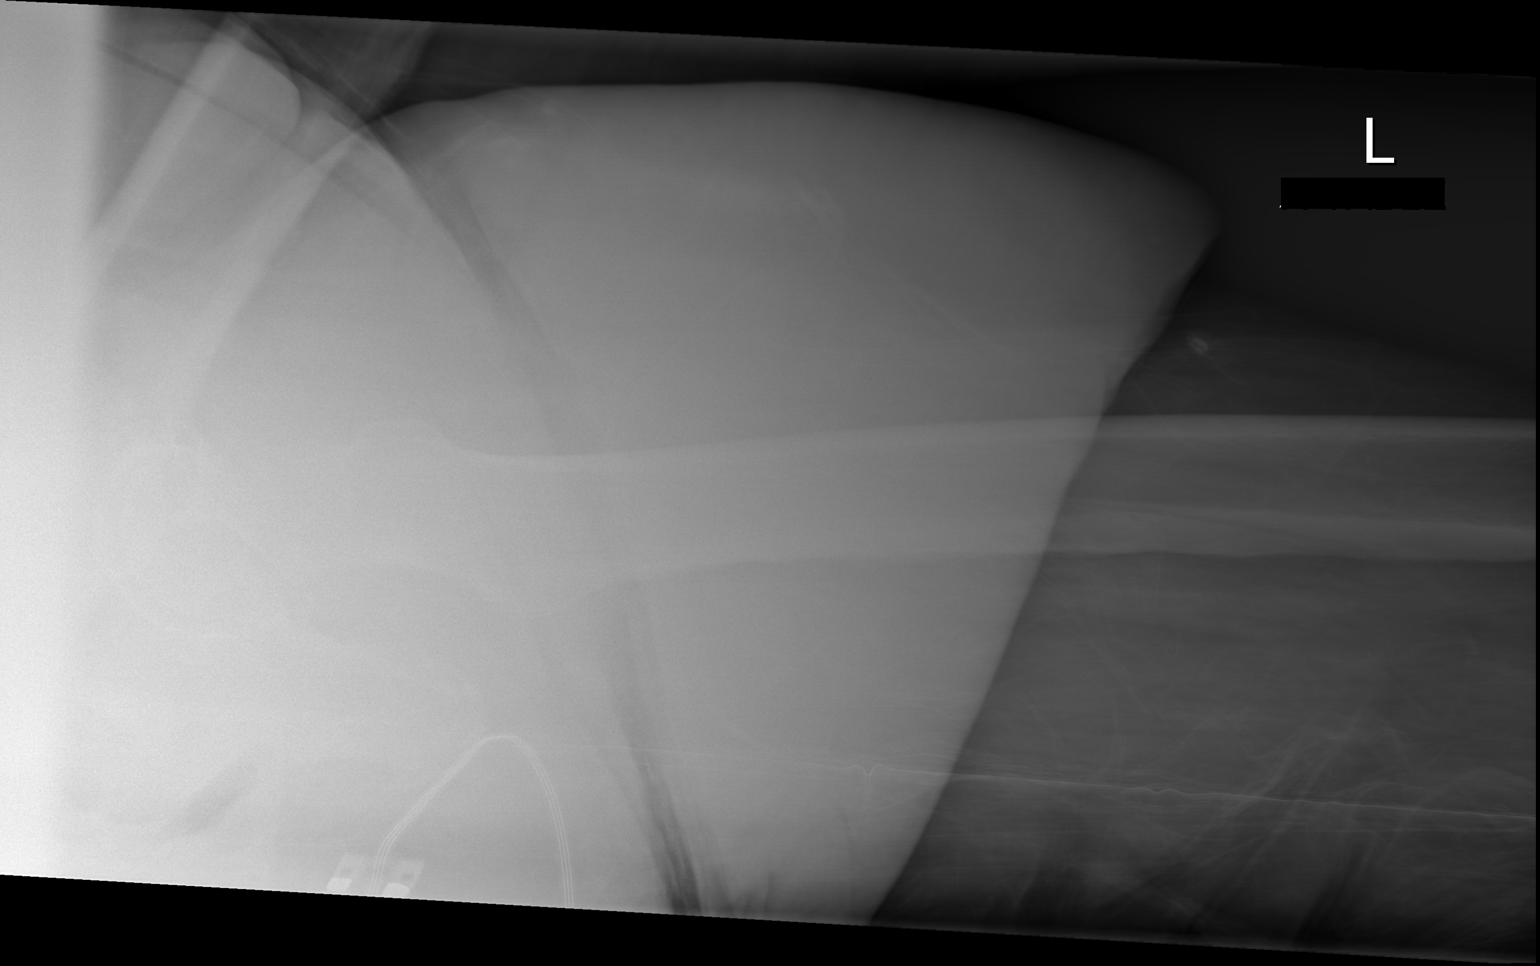

[3 of 3 positions shown; findings below may reference images not displayed]

FINDINGS: Question a subtle linear lucency along the LEFT greater trochanter
in the region of the LEFT femoral neck. Enthesopathic changes of the
greater and lesser trochanter. No additional possible fractures
noted. Limited assessment of the sacrum secondary to overlapping
bowel contents. Mild degenerative changes of the LEFT hip.
IMPRESSION: Linear lucency of the LEFT femoral neck along the LEFT greater
trochanter could reflect a nondisplaced fracture in the appropriate
clinical setting. Consider further evaluation with dedicated
cross-sectional imaging if clinical concern.

## 2023-06-25 IMAGING — CR DG FEMUR 2+V*L*
4 series · 4 of 4 positions shown · non-contrast
Comparison: October 22, 2021

CLINICAL DATA: fall

EXAM:
DG HIP (WITH OR WITHOUT PELVIS) 2-3V LEFT; LEFT FEMUR 2 VIEWS

[x hip ap left]
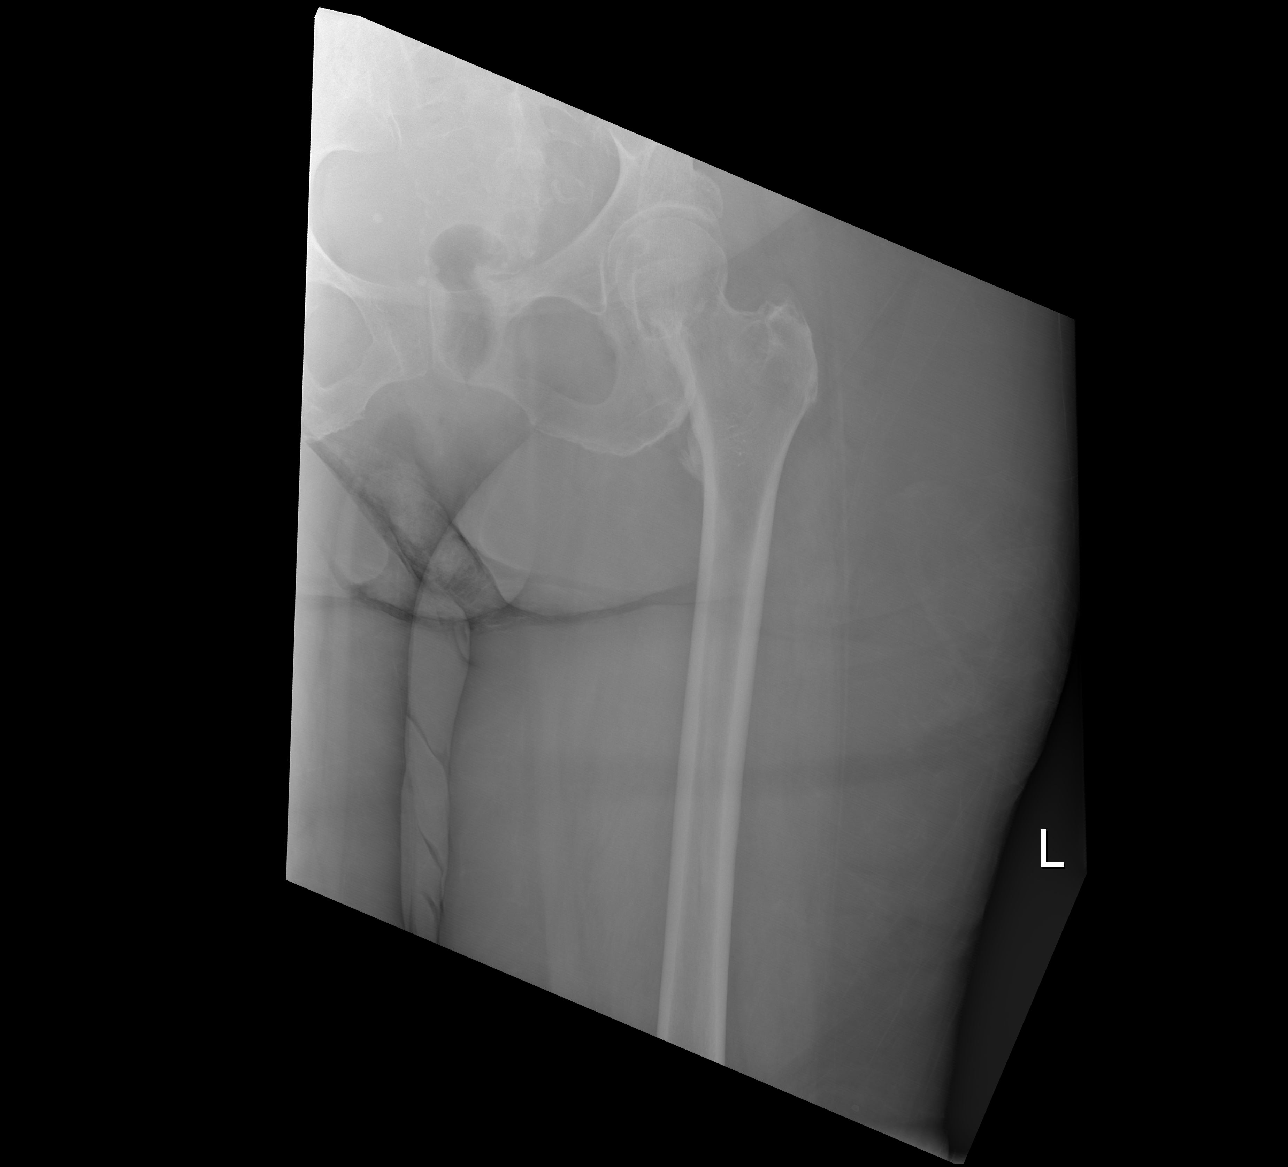

[x femur distal ap left]
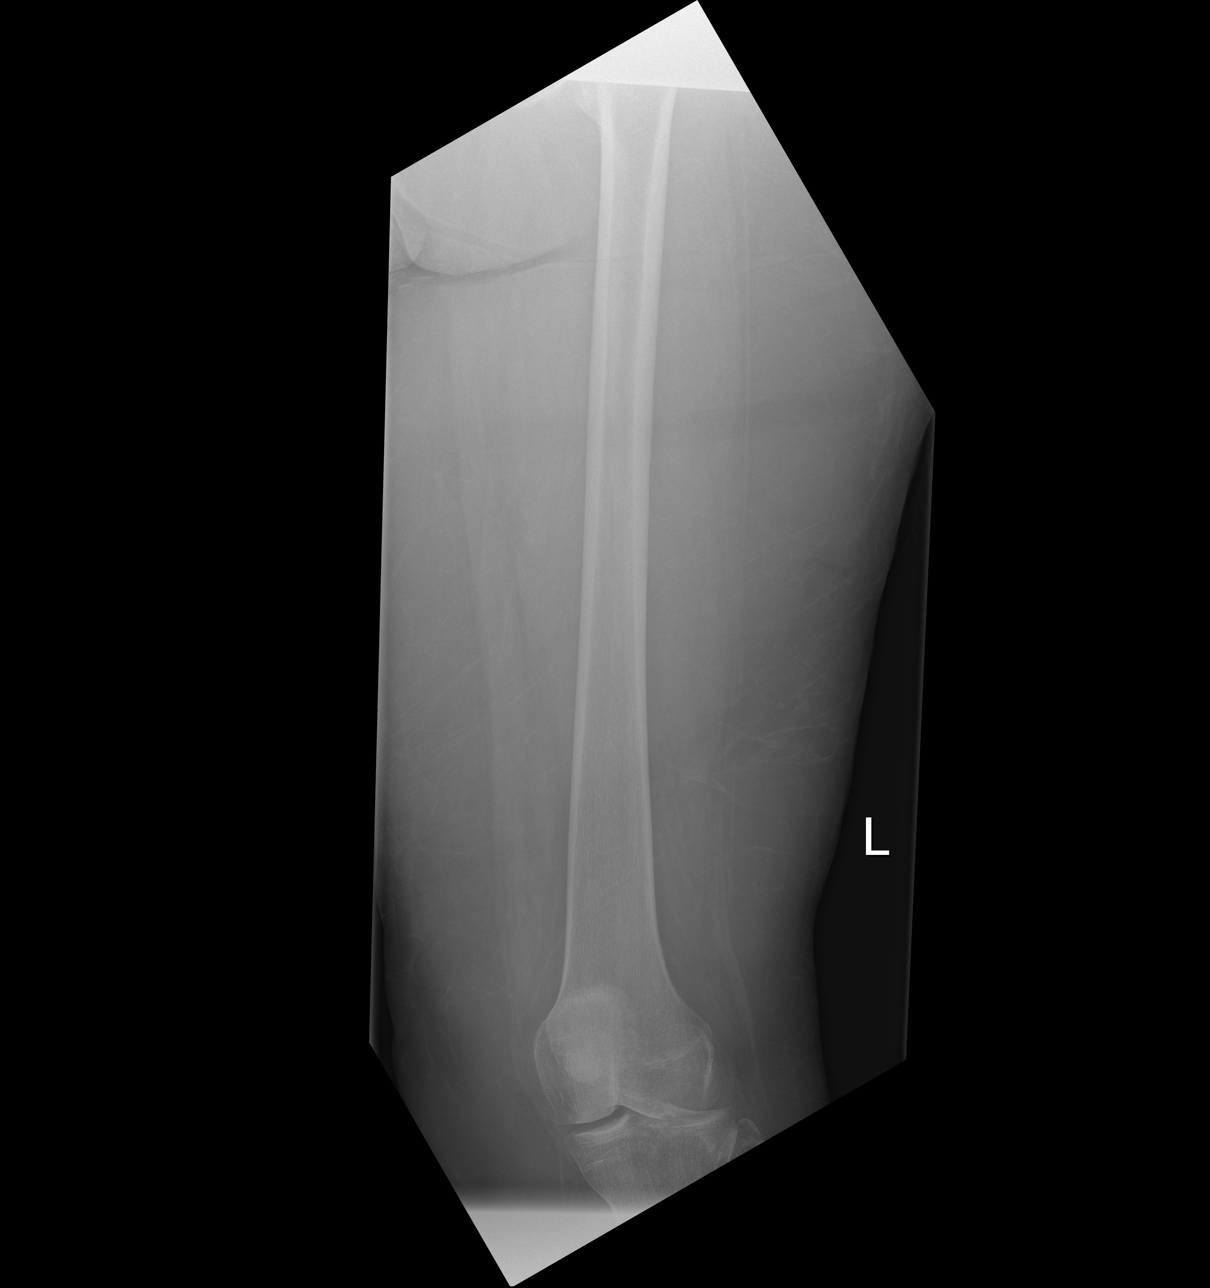

[x femur distal lat left]
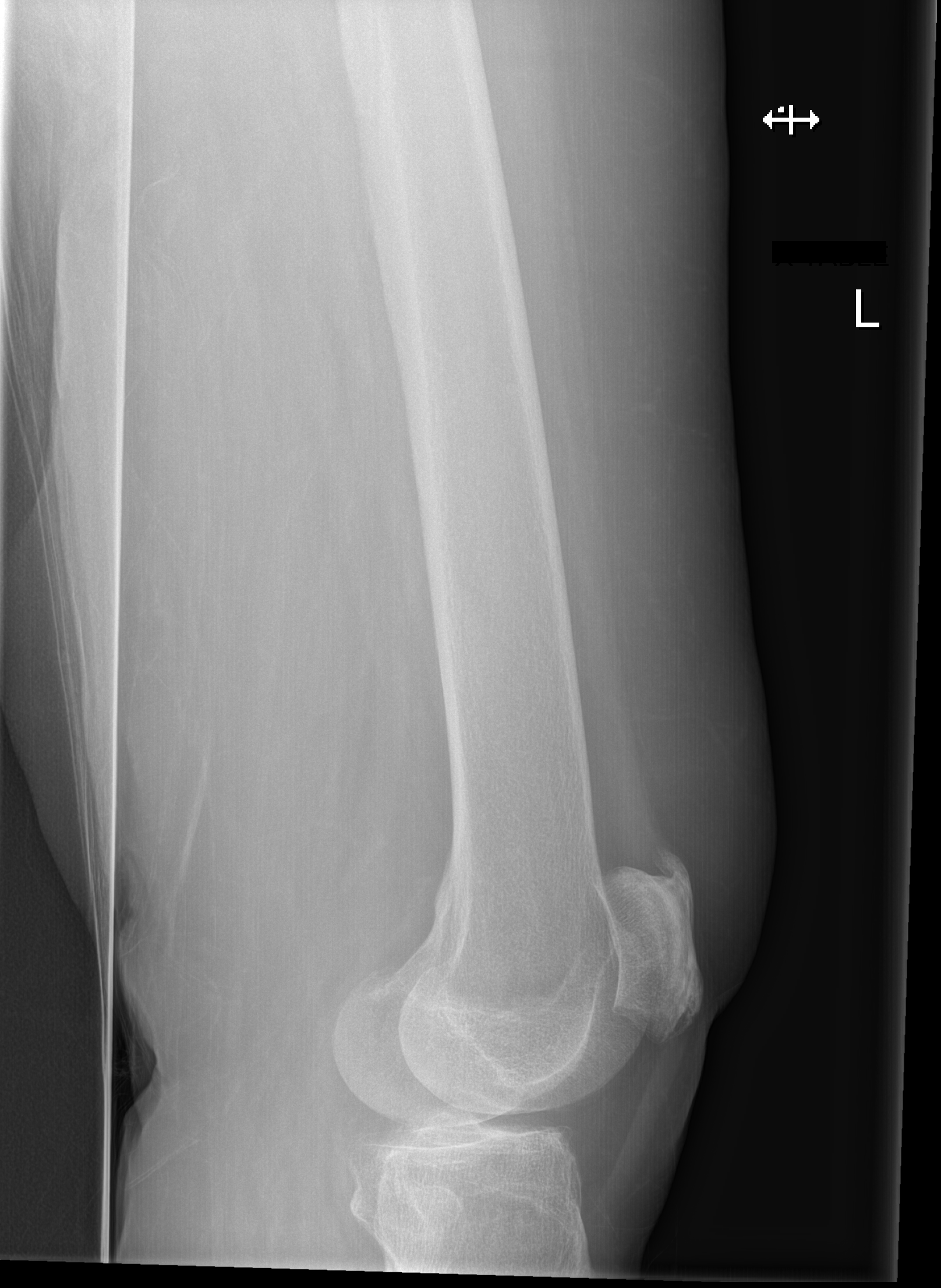

[x hip lat left]
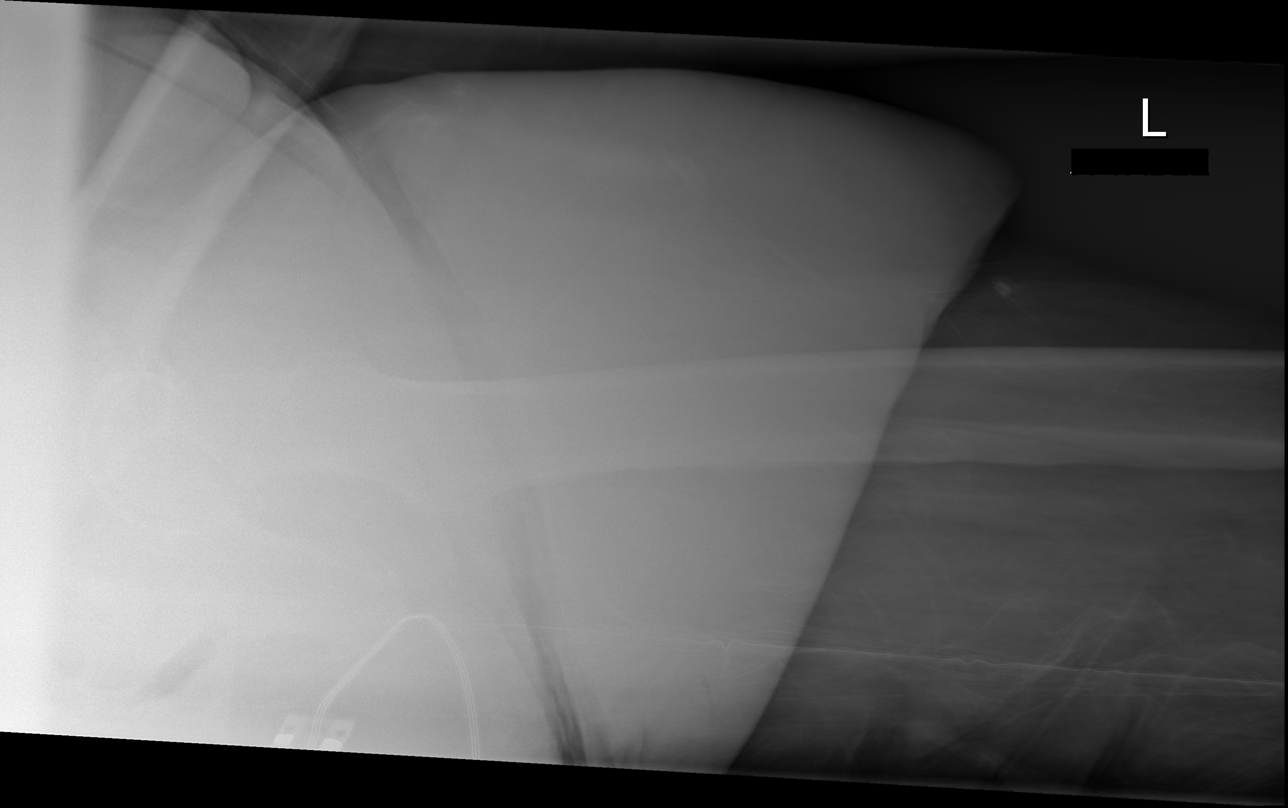

[4 of 4 positions shown; findings below may reference images not displayed]

FINDINGS: Question a subtle linear lucency along the LEFT greater trochanter
in the region of the LEFT femoral neck. Enthesopathic changes of the
greater and lesser trochanter. No additional possible fractures
noted. Limited assessment of the sacrum secondary to overlapping
bowel contents. Mild degenerative changes of the LEFT hip.
IMPRESSION: Linear lucency of the LEFT femoral neck along the LEFT greater
trochanter could reflect a nondisplaced fracture in the appropriate
clinical setting. Consider further evaluation with dedicated
cross-sectional imaging if clinical concern.

## 2023-06-25 IMAGING — CT CT HIP*L* W/O CM
2 of 3 series · 18 of 46 positions shown, 20 images · non-contrast
Comparison: None.

CLINICAL DATA: Hip trauma

EXAM:
CT OF THE LEFT HIP WITHOUT CONTRAST
TECHNIQUE: Multidetector CT imaging of the left hip was performed according to
the standard protocol. Multiplanar CT image reconstructions were
also generated.
RADIATION DOSE REDUCTION: This exam was performed according to the
departmental dose-optimization program which includes automated
exposure control, adjustment of the mA and/or kV according to
patient size and/or use of iterative reconstruction technique.

[Series 3: axial st · axial · 0.38mm/px · z∈[-177,-27]mm · 15 of 84 slices shown, 17 images]
[im 6/84  soft-tissue]
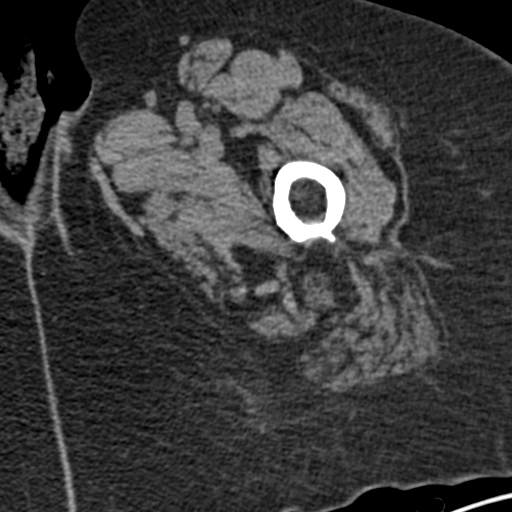
[im 6/84  bone]
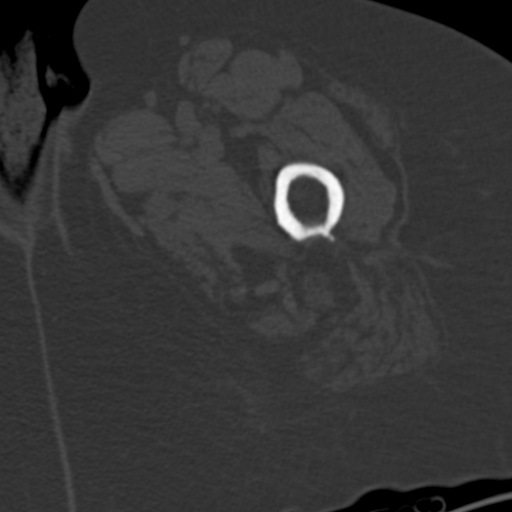
[im 11/84  soft-tissue]
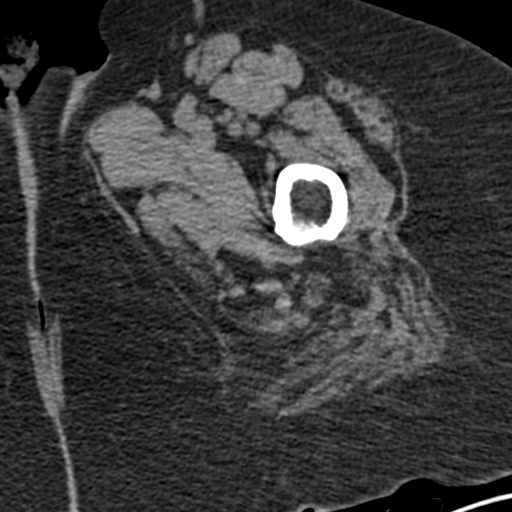
[im 17/84  soft-tissue]
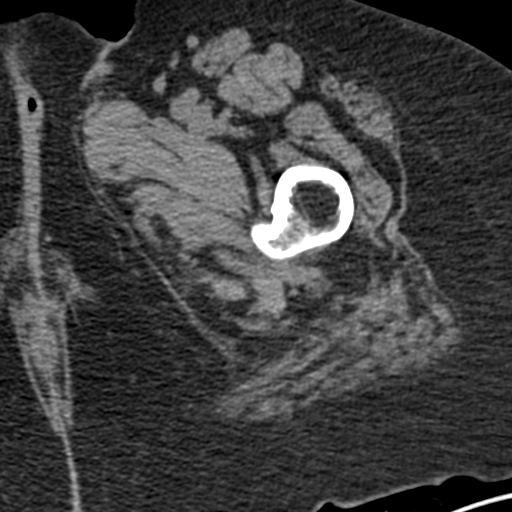
[im 22/84  soft-tissue]
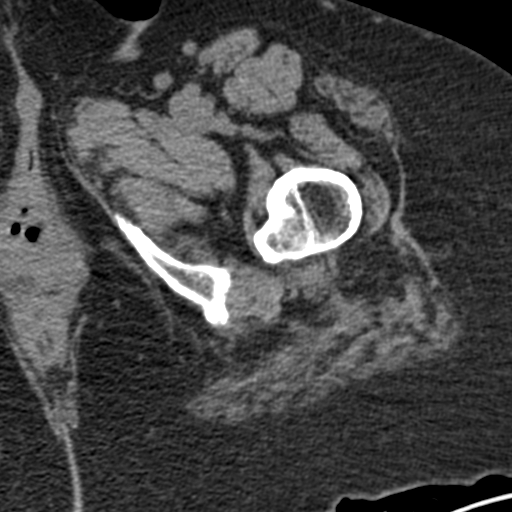
[im 27/84  soft-tissue]
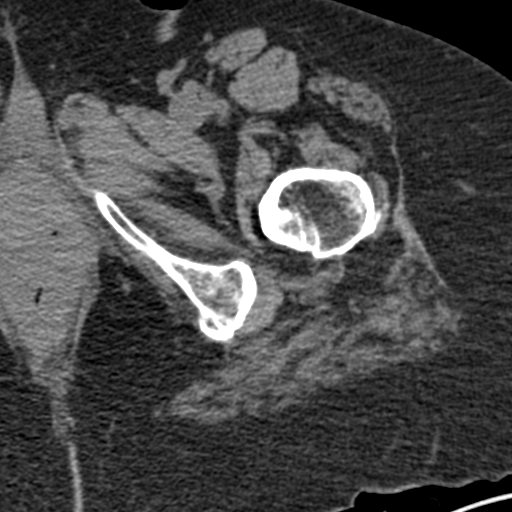
[im 33/84  soft-tissue]
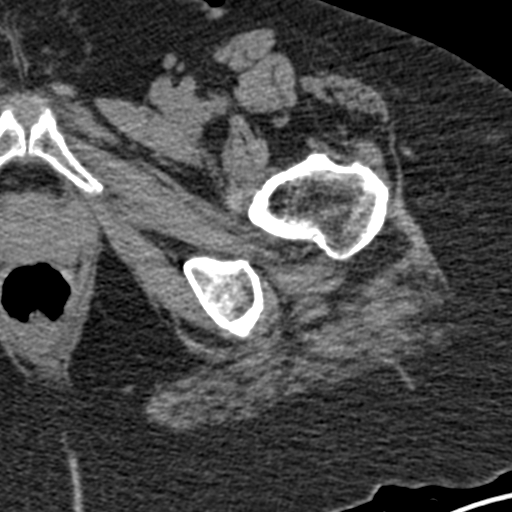
[im 38/84  soft-tissue]
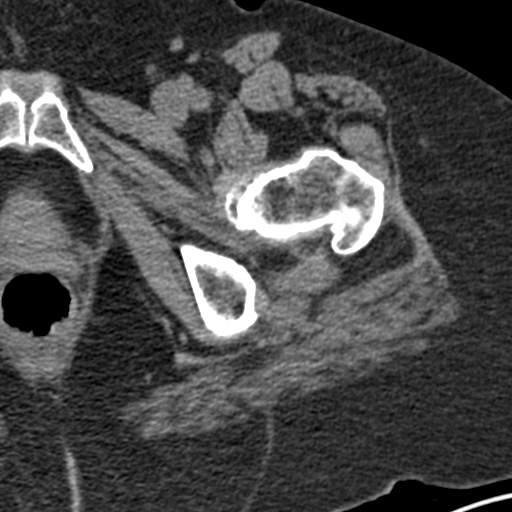
[im 43/84  soft-tissue]
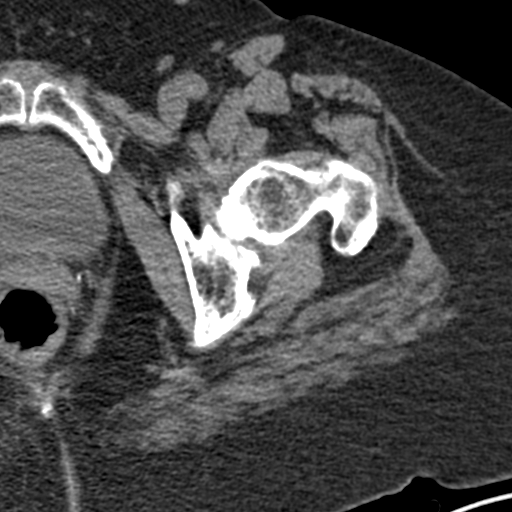
[im 49/84  soft-tissue]
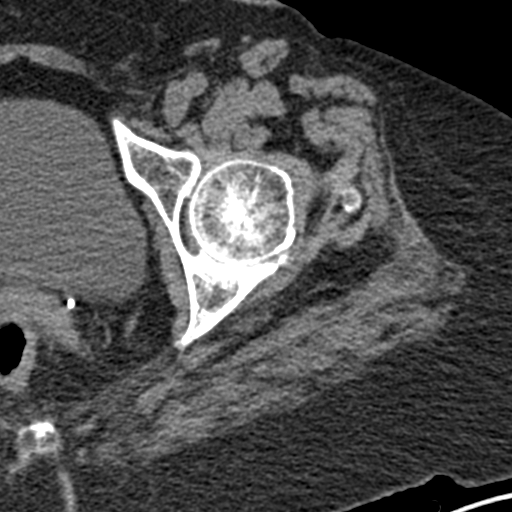
[im 49/84  bone]
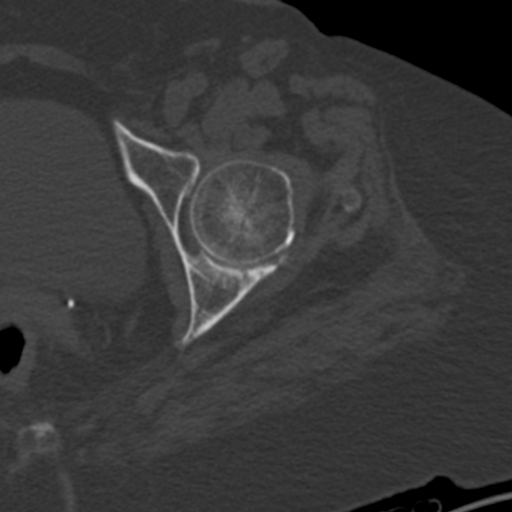
[im 54/84  soft-tissue]
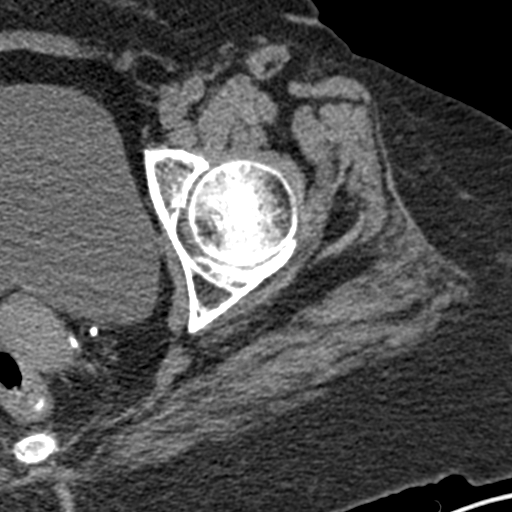
[im 59/84  soft-tissue]
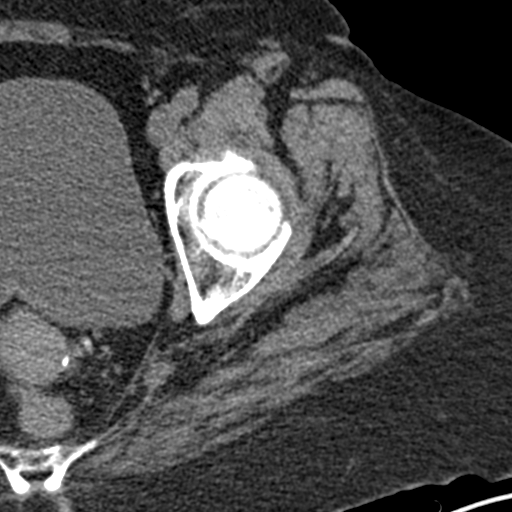
[im 65/84  soft-tissue]
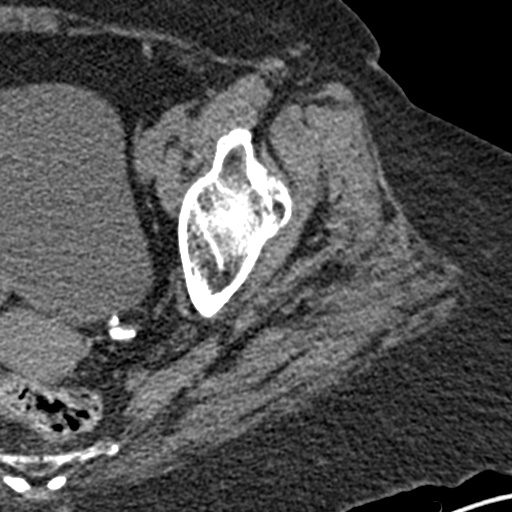
[im 70/84  soft-tissue]
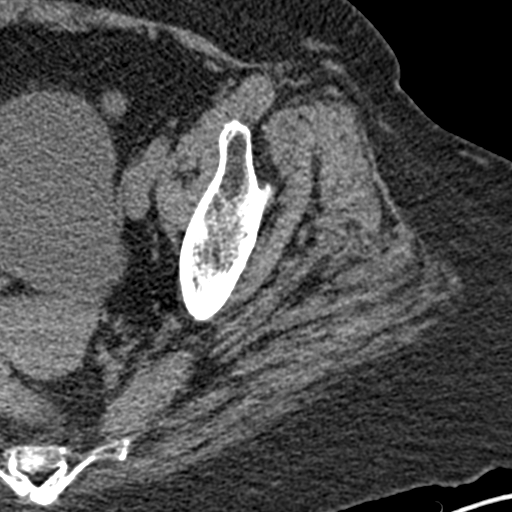
[im 75/84  soft-tissue]
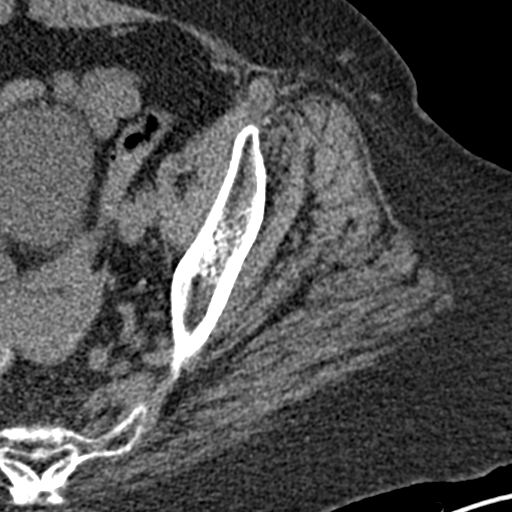
[im 81/84  soft-tissue]
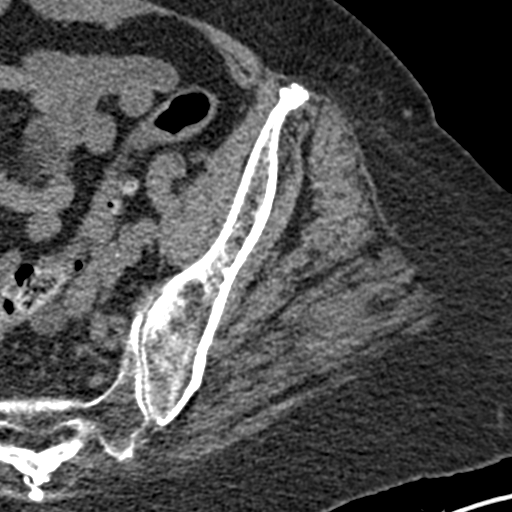

[Series 7: coronal st · coronal · 0.35mm/px · 3 of 62 slices shown]
[im 21/62  soft-tissue]
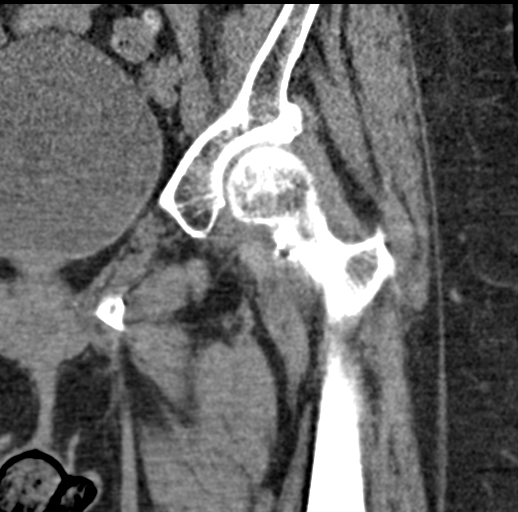
[im 28/62  soft-tissue]
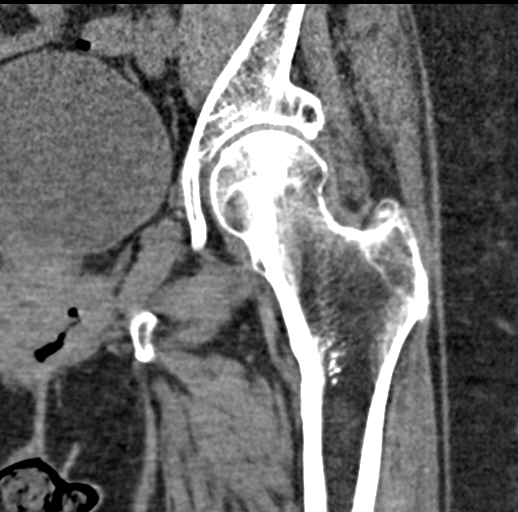
[im 34/62  soft-tissue]
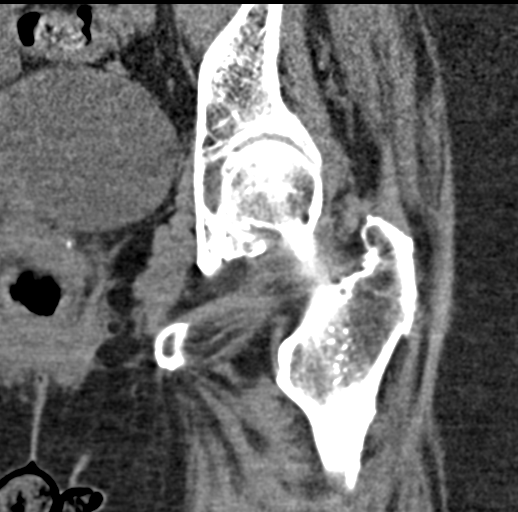

[18 of 46 positions shown; findings below may reference images not displayed]

FINDINGS: Bones/Joint/Cartilage

No acute fracture. There is mild osteoarthritic degenerative change
at the left hip.

Ligaments

Suboptimally assessed by CT.

Muscles and Tendons

No acute abnormality

Soft tissues

Unremarkable
IMPRESSION: No acute fracture of the left hip.

## 2023-06-28 IMAGING — CT CT HEAD CODE STROKE
4 series · 16 of 47 positions shown, 18 images · non-contrast
Comparison: 06/27/2021

CLINICAL DATA: Code stroke.  Neuro deficit, acute, stroke suspected



[Series 4: head wo · axial · 0.41mm/px · z∈[+1178,+1284]mm · 7 of 29 slices shown, 9 images]
[im 4/29  brain]
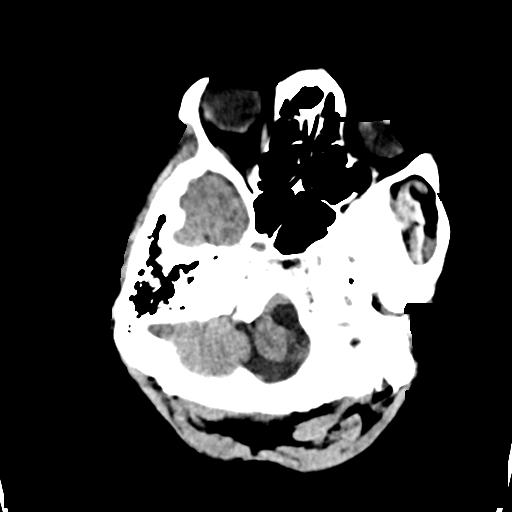
[im 4/29  bone]
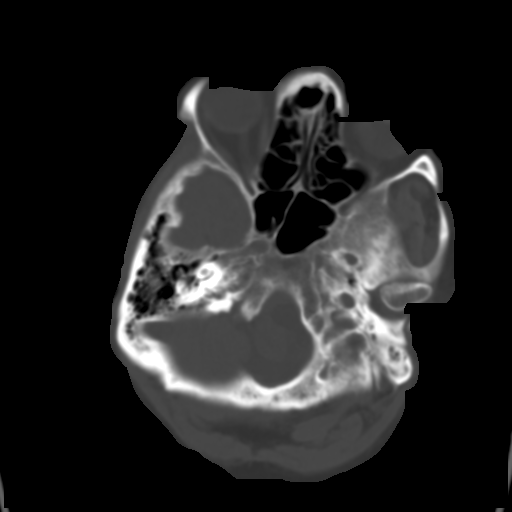
[im 8/29  brain]
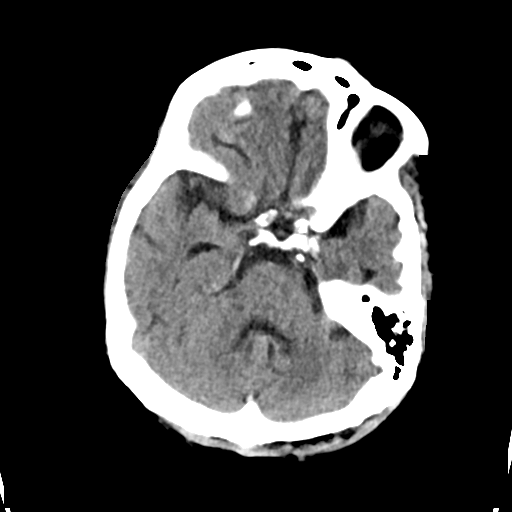
[im 11/29  brain]
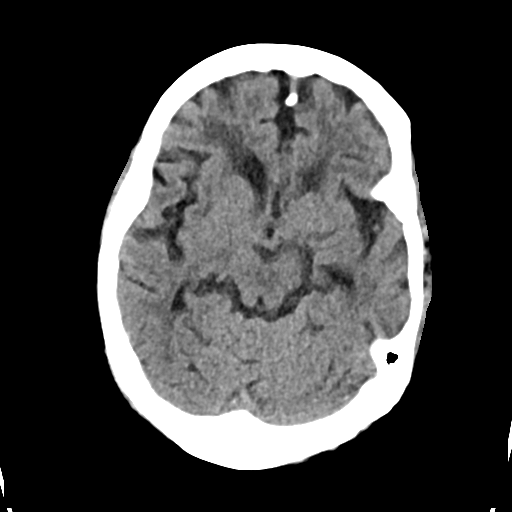
[im 15/29  brain]
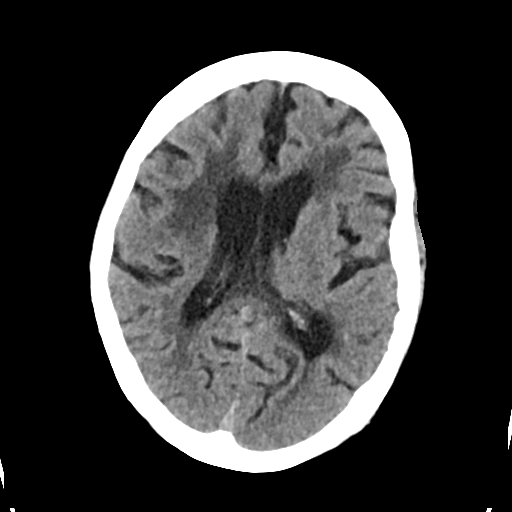
[im 18/29  brain]
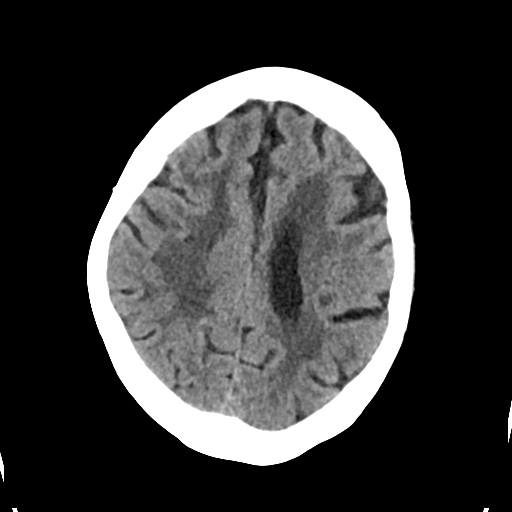
[im 18/29  bone]
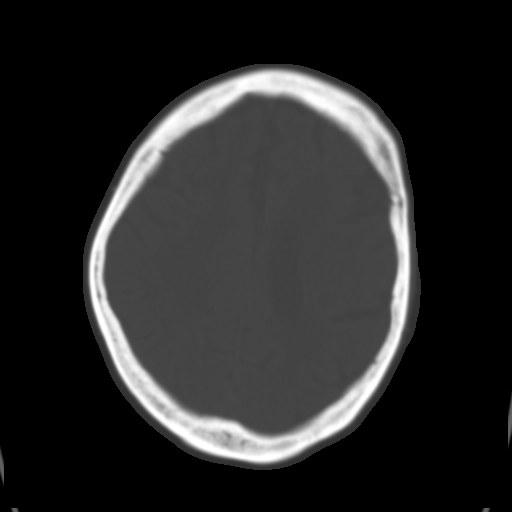
[im 22/29  brain]
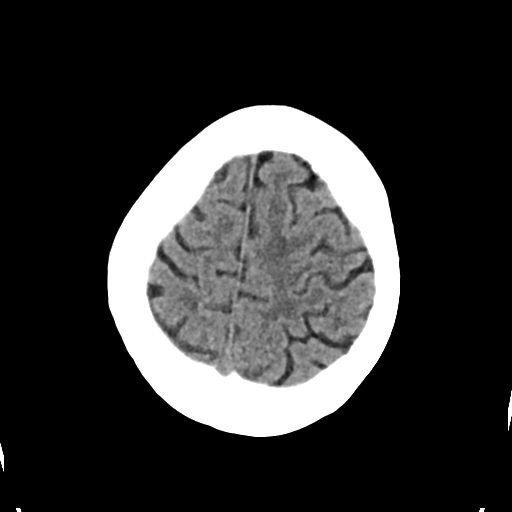
[im 25/29  brain]
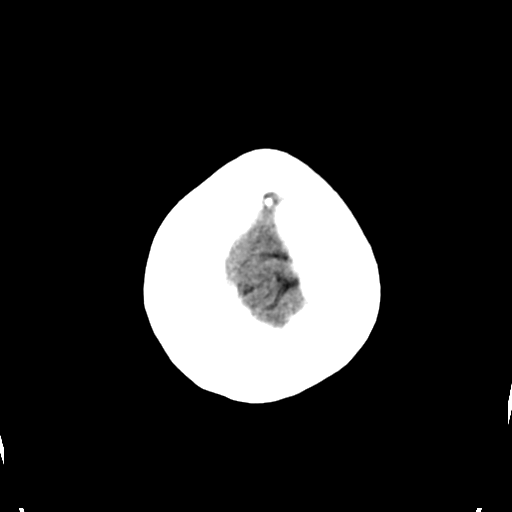

[Series 5: head bone · axial · 0.41mm/px · z∈[+1178,+1206]mm · 3 of 71 slices shown]
[im 8/71  bone]
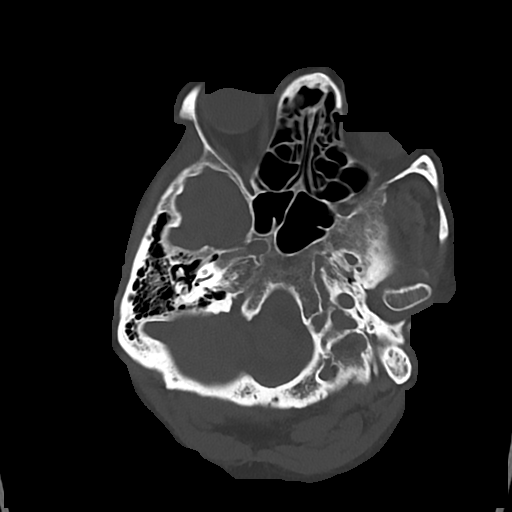
[im 15/71  bone]
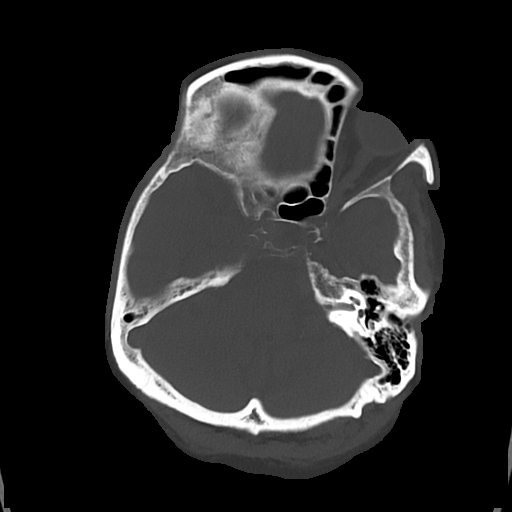
[im 22/71  bone]
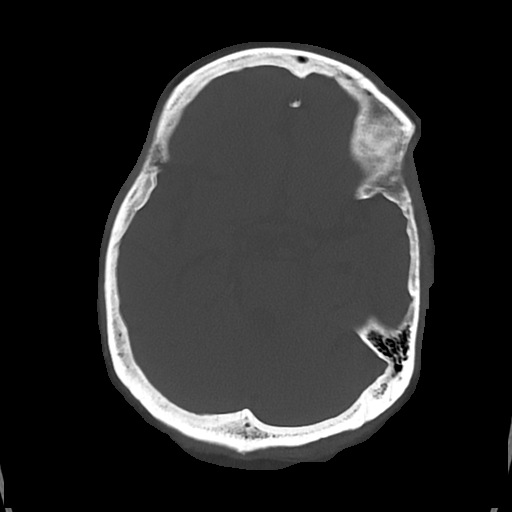

[Series 6: cor soft · coronal · 0.31mm/px · 3 of 64 slices shown]
[im 22/64  brain]
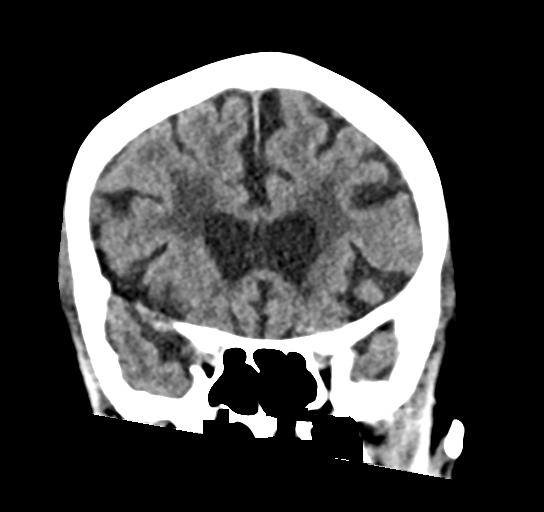
[im 29/64  brain]
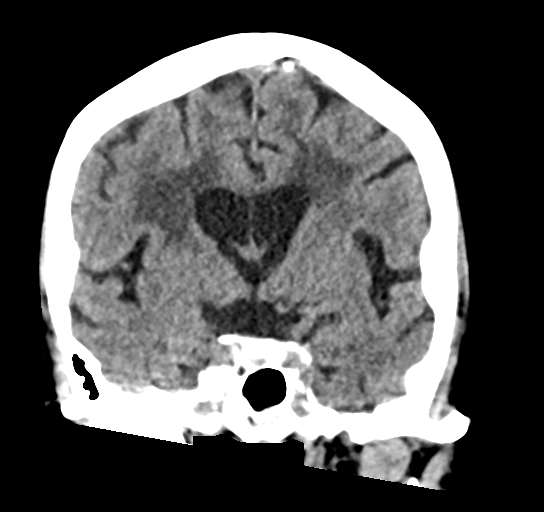
[im 36/64  brain]
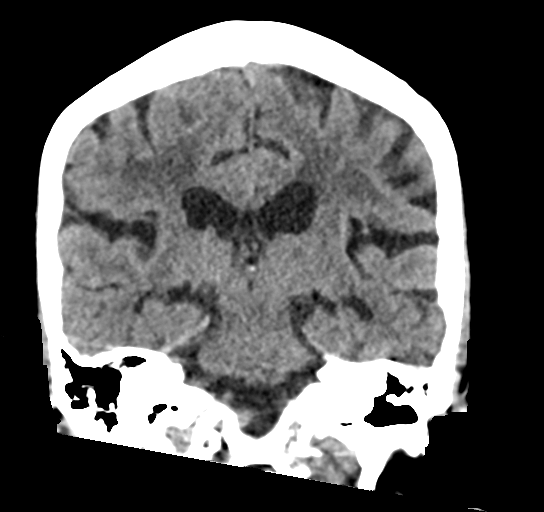

[Series 7: sag soft · sagittal · 0.32mm/px · 3 of 55 slices shown]
[im 19/55  brain]
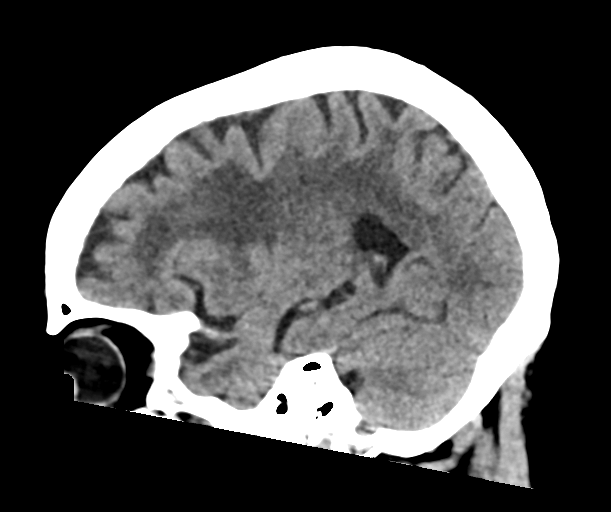
[im 28/55  brain]
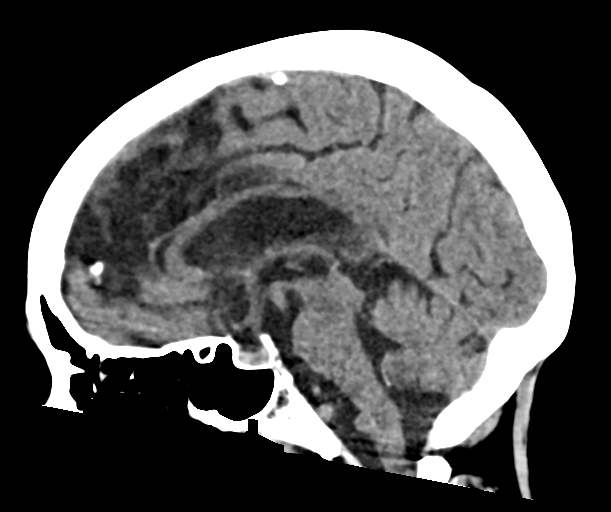
[im 36/55  brain]
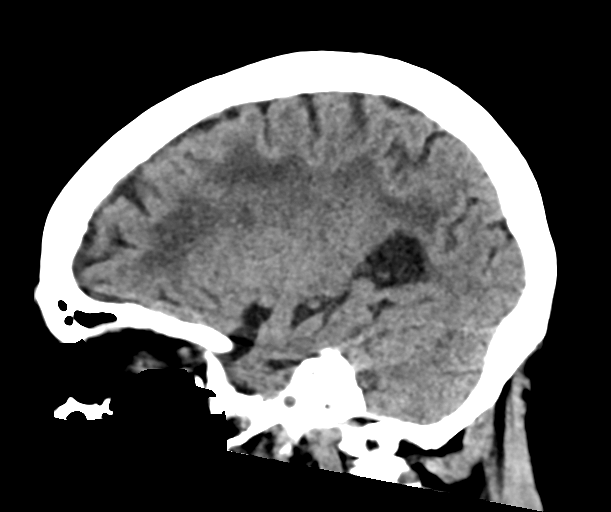

[16 of 47 positions shown; findings below may reference images not displayed]

FINDINGS: Brain: There is no acute intracranial hemorrhage, mass effect, or
edema. No new loss of gray-white differentiation. Confluent areas of
low-density in the supratentorial white matter are nonspecific but
probably reflect similar moderate to marked chronic microvascular
ischemic changes. Prominence of the ventricles and sulci reflects
similar parenchymal volume loss. No extra-axial collection.

Vascular: No hyperdense vessel. Intracranial atherosclerotic
calcification at the skull base.

Skull: Unremarkable.

Sinuses/Orbits: No acute abnormality.

Other: Mastoid air cells are clear.

ASPECTS ([place]berta Stroke Program Early CT Score)

- Ganglionic level infarction (caudate, lentiform nuclei, internal
capsule, insula, M1-M3 cortex): 7

- Supraganglionic infarction (M4-M6 cortex): 3

Total score (0-10 with 10 being normal): 10
IMPRESSION: There is no acute intracranial hemorrhage or evidence of acute
infarction. ASPECT score is 10.

These results were communicated to Dr. Nomasibulele at [DATE] on
11/27/2021 by text page via the [employer] messaging system.

## 2023-06-28 IMAGING — CT CT HEAD W/O CM
4 series · 16 of 47 positions shown, 18 images · non-contrast
Comparison: CT head 11/27/2021

CLINICAL DATA: Stroke, hemorrhagic



[Series 3: head without · axial · non-contrast · 0.40mm/px · z∈[-140,-20]mm · 7 of 34 slices shown, 9 images]
[im 5/34  brain]
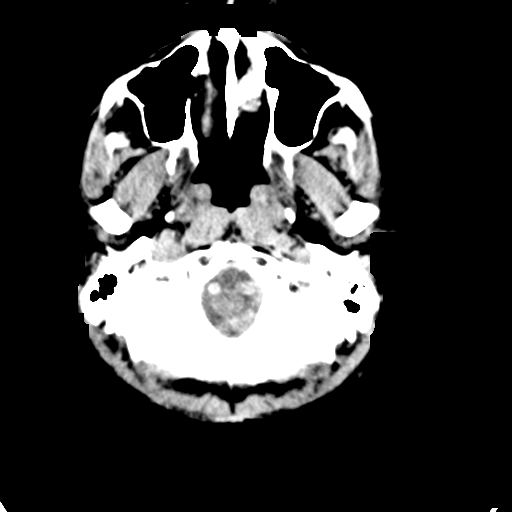
[im 5/34  bone]
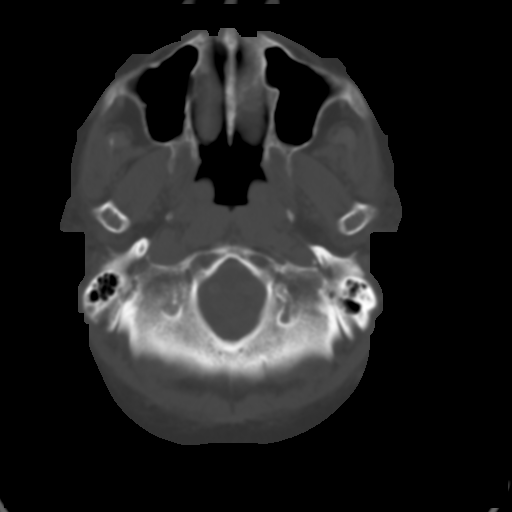
[im 9/34  brain]
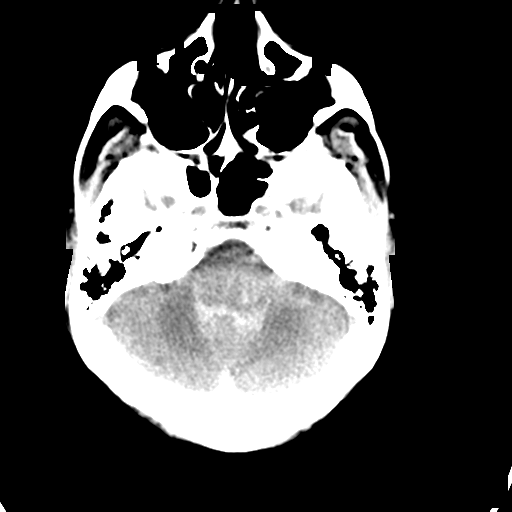
[im 13/34  brain]
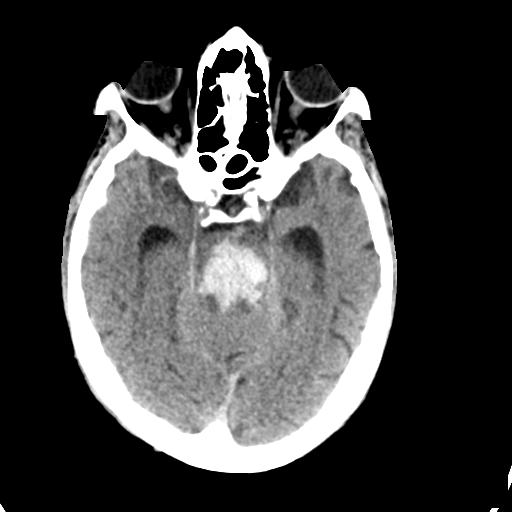
[im 17/34  brain]
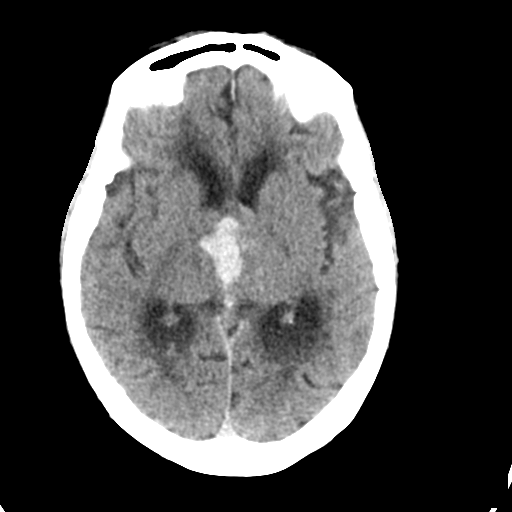
[im 21/34  brain]
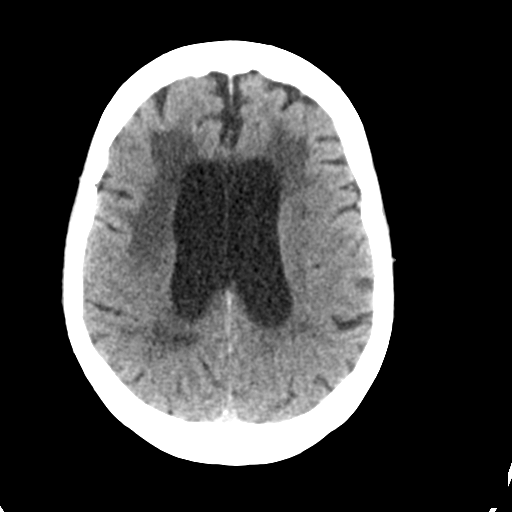
[im 21/34  bone]
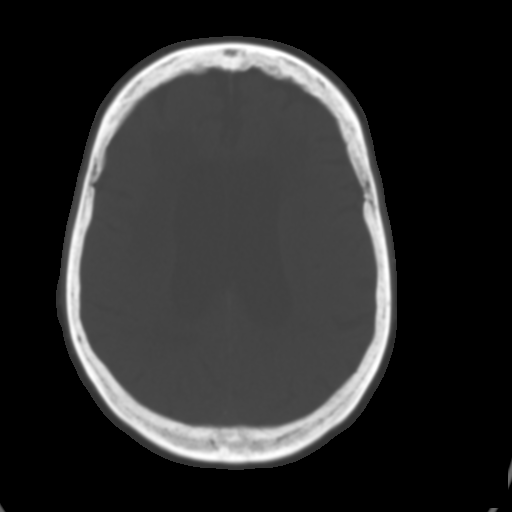
[im 25/34  brain]
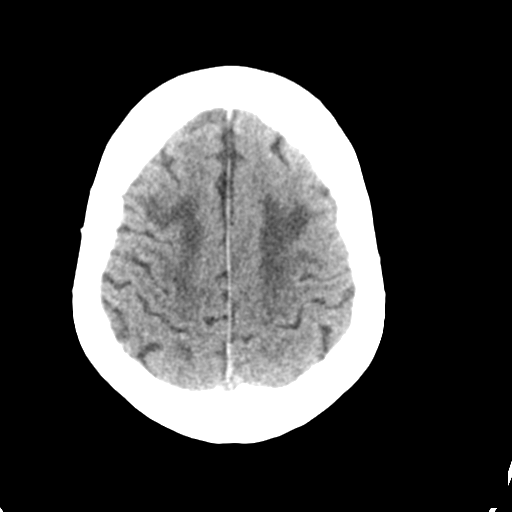
[im 29/34  brain]
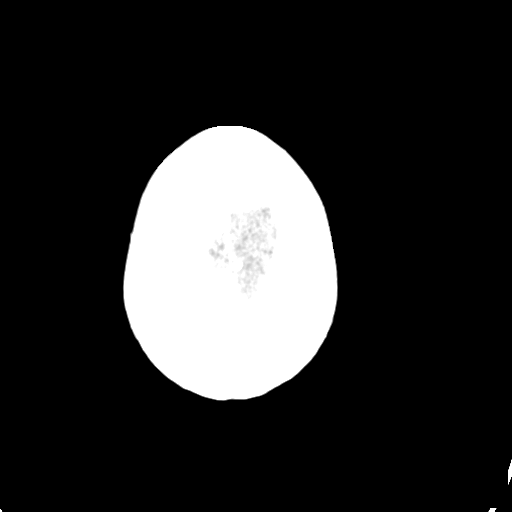

[Series 4: head bone · axial · 0.40mm/px · z∈[-144,-110]mm · 3 of 85 slices shown]
[im 9/85  bone]
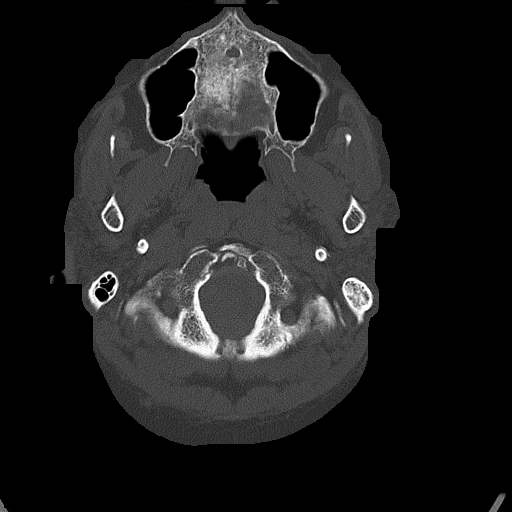
[im 17/85  bone]
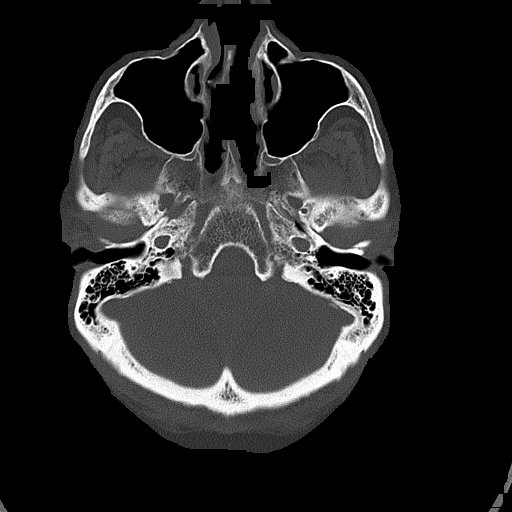
[im 26/85  bone]
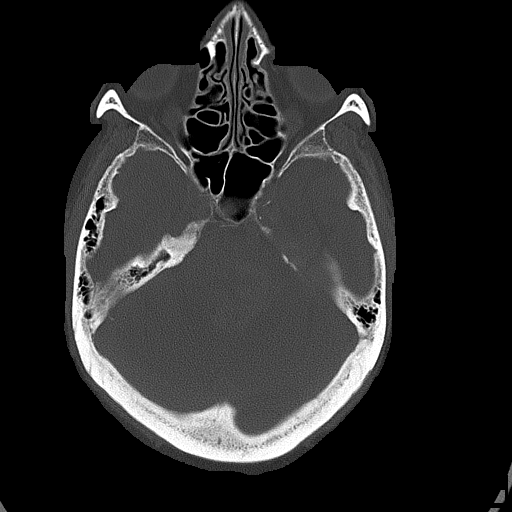

[Series 5: head without cor · coronal · non-contrast · 0.32mm/px · 3 of 67 slices shown]
[im 23/67  brain]
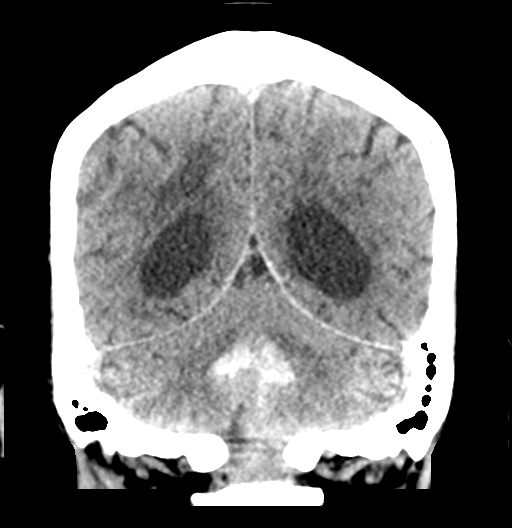
[im 30/67  brain]
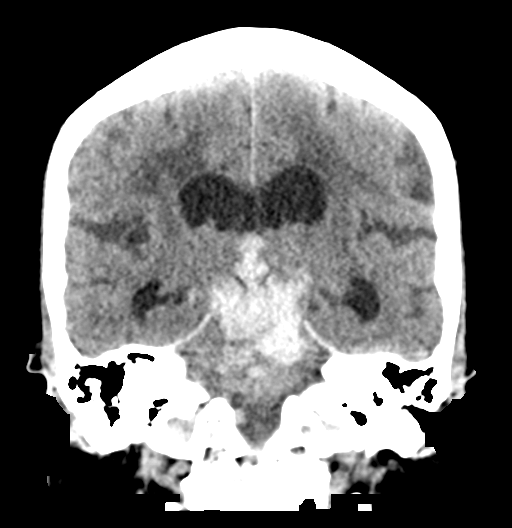
[im 37/67  brain]
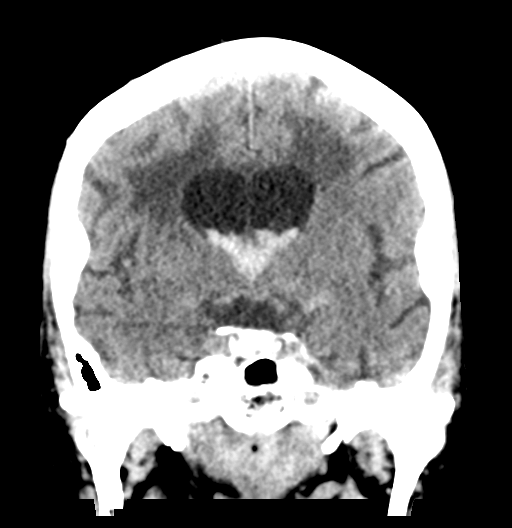

[Series 6: head without sag · sagittal · non-contrast · 0.33mm/px · 3 of 54 slices shown]
[im 18/54  brain]
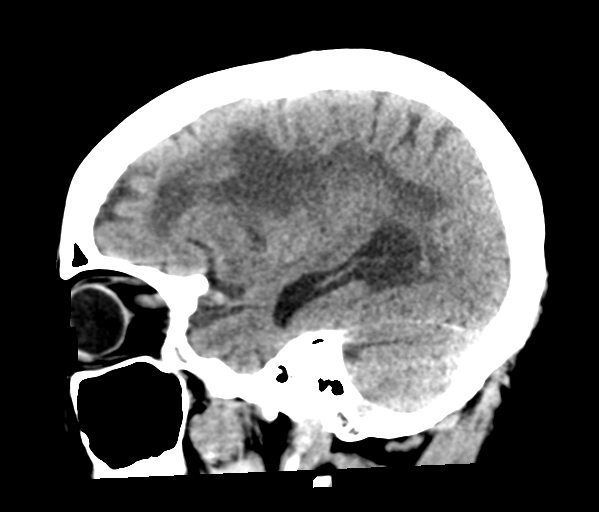
[im 27/54  brain]
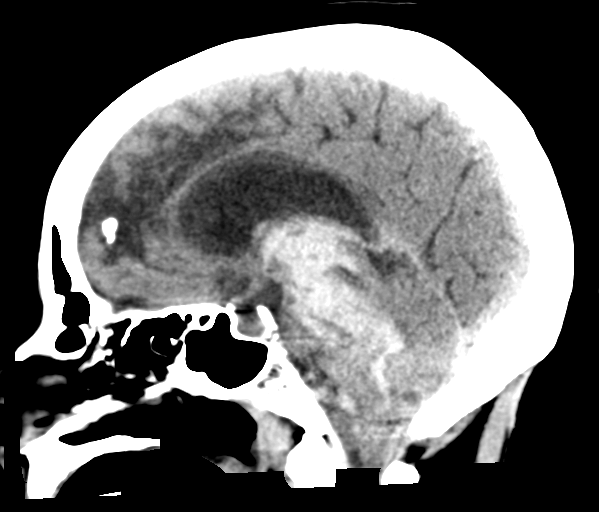
[im 36/54  brain]
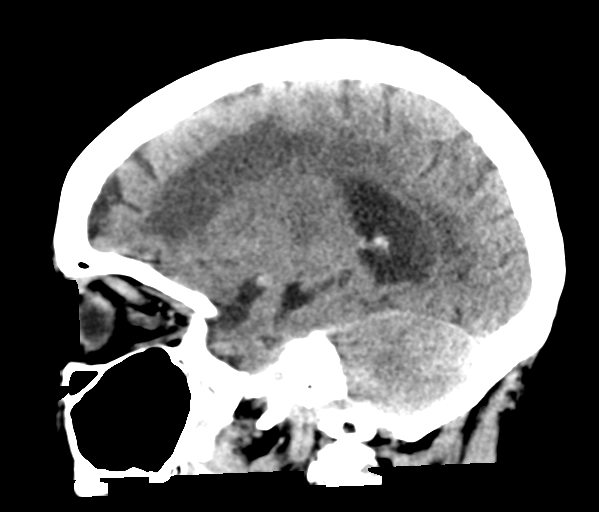

[16 of 47 positions shown; findings below may reference images not displayed]

FINDINGS: Brain:

Patchy and confluent areas of decreased attenuation are noted
throughout the deep and periventricular white matter of the cerebral
hemispheres bilaterally, compatible with chronic microvascular
ischemic disease. No evidence of large-territorial acute infarction.
Interval increase in parenchymal hemorrhage now involving the
midbrain and pons. Interval extension into the ventricular system
with almost complete opacification of the third and fourth
ventricles. Some blood products noted within the frontal horns of
the lateral ventricles. Associated new hydrocephalus with associated
vasogenic edema of the surrounding white matter. No midline shift.
No hydrocephalus. Interval partial effacement of the cerebellar
fissures. Basilar cisterns are effaced with concern of downward
herniation.

Vascular: No hyperdense vessel. Atherosclerotic calcifications are
present within the cavernous internal carotid arteries.

Skull: No acute fracture or focal lesion.

Sinuses/Orbits: Paranasal sinuses and mastoid air cells are clear.
The orbits are unremarkable.

Other: None.
IMPRESSION: Marked interval worsening of a pontine and mid brain parenchymal
hemorrhage involving the majority of the brainstem and now extending
into the ventricular system leading to acute hydrocephalus.

These results were called by telephone at the time of interpretation
on 11/27/2021 at [DATE] to provider Dr. Hamlet, who verbally
acknowledged these results.

## 2023-06-28 IMAGING — CT CT ANGIO HEAD-NECK (W OR W/O PERF)
3 of 7 series · 10 of 36 positions shown · IV contrast (APPLIED)
Comparison: Same day head CT 11/27/2021.

CLINICAL DATA: Neuro deficit, acute, stroke suspected. Acute
aphasia. Altered mental status. Stroke.

EXAM:
CT ANGIOGRAPHY HEAD AND NECK
TECHNIQUE: Multidetector CT imaging of the head and neck was performed using
the standard protocol during bolus administration of intravenous
contrast. Multiplanar CT image reconstructions and MIPs were
obtained to evaluate the vascular anatomy. Carotid stenosis
measurements (when applicable) are obtained utilizing NASCET
criteria, using the distal internal carotid diameter as the
denominator.

[Series 6: cta neck/head · axial · 0.50mm/px · z∈[+1103,+1217]mm · 2 of 172 slices shown]
[im 58/172  soft-tissue]
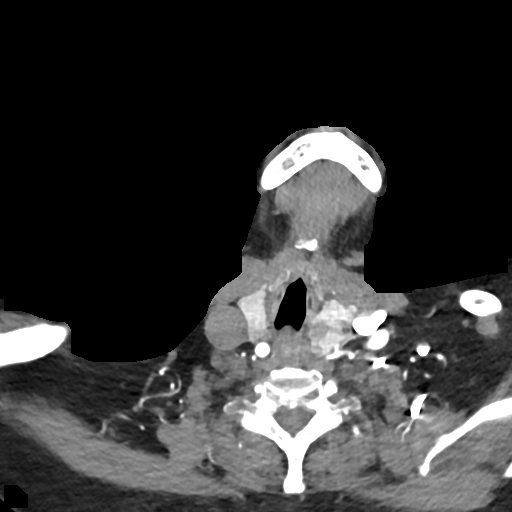
[im 115/172  bone]
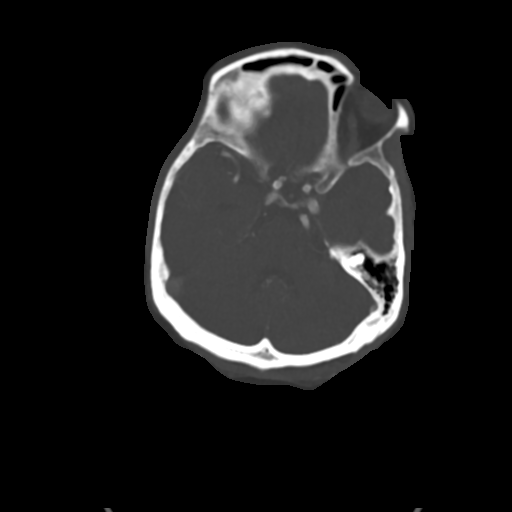

[Series 8: ax thins · axial · 0.41mm/px · z∈[+991,+1232]mm · 6 of 354 slices shown]
[im 51/354  soft-tissue]
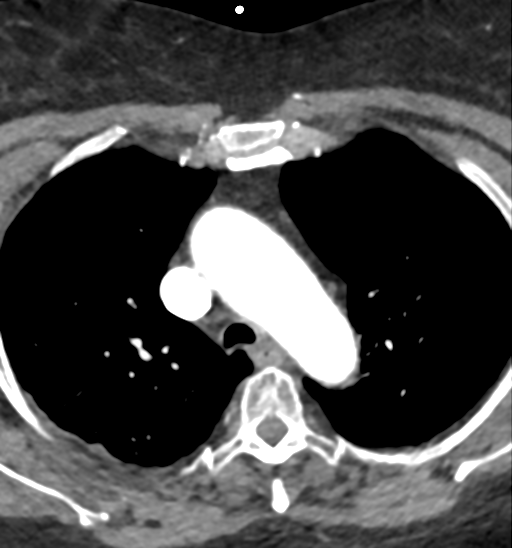
[im 101/354  soft-tissue]
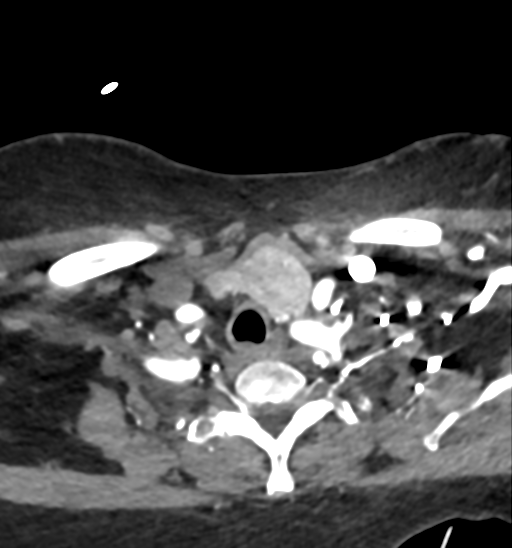
[im 152/354  soft-tissue]
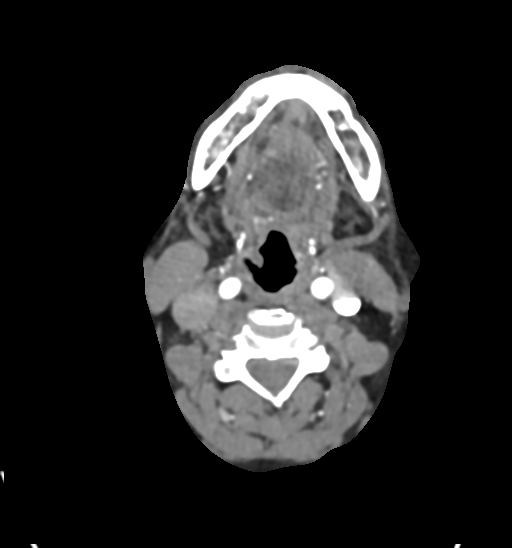
[im 202/354  soft-tissue]
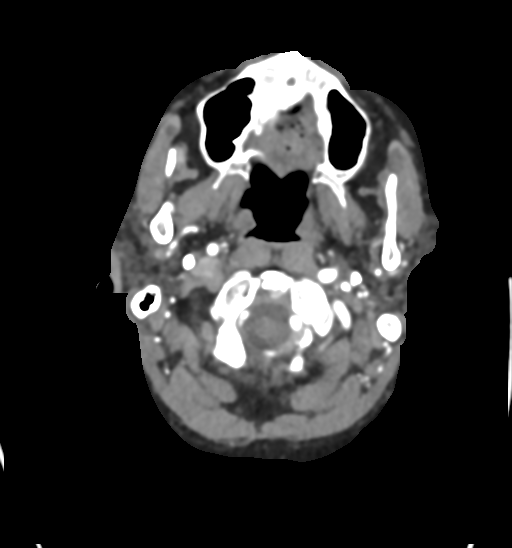
[im 253/354  soft-tissue]
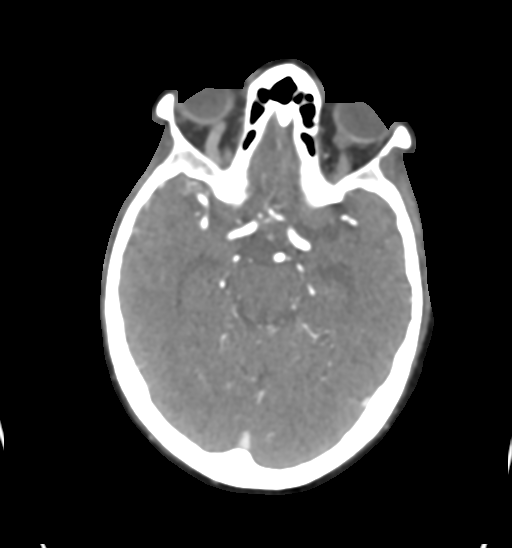
[im 303/354  soft-tissue]
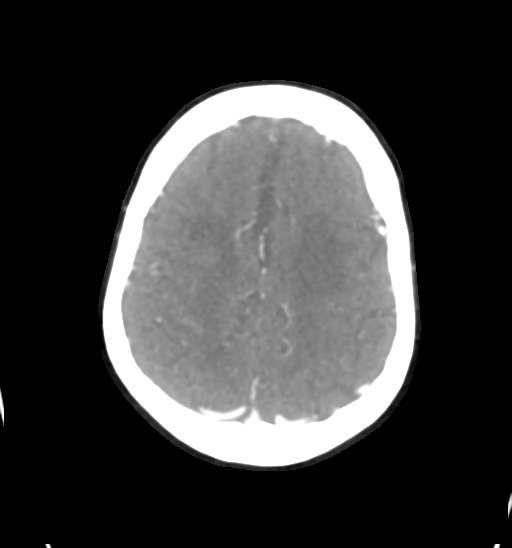

[Series 10: sag thins · sagittal · 0.44mm/px · 2 of 213 slices shown]
[im 48/213  soft-tissue]
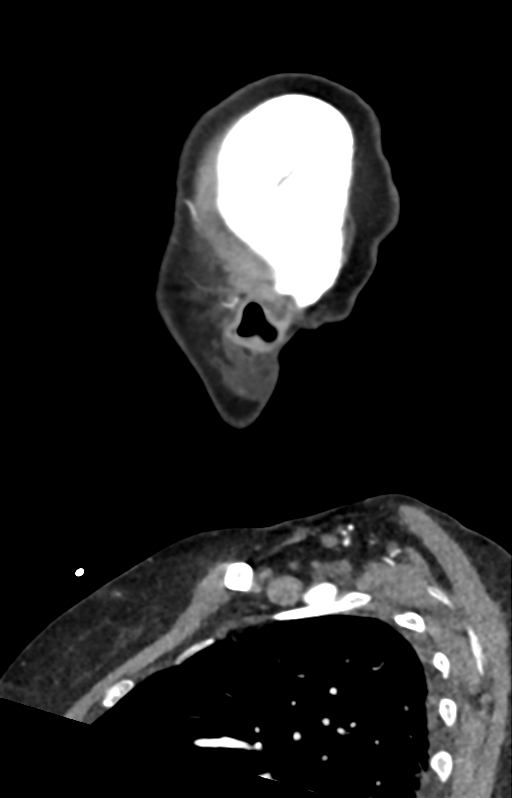
[im 166/213  soft-tissue]
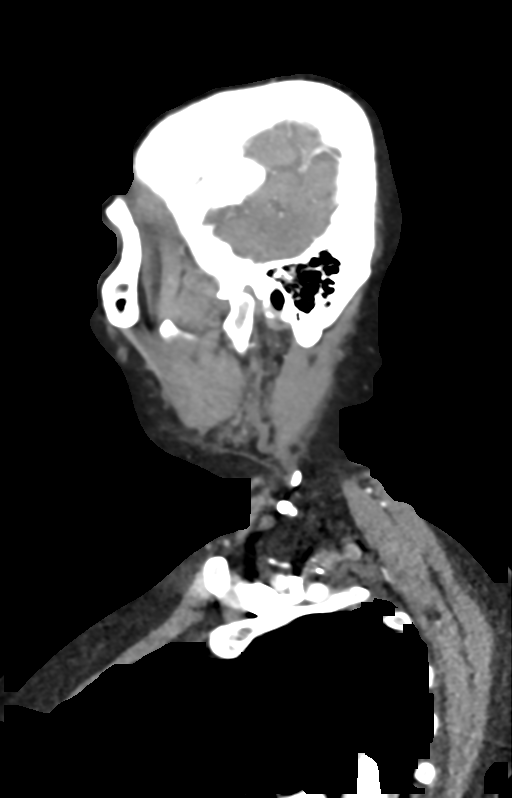

[10 of 36 positions shown; findings below may reference images not displayed]

RADIATION DOSE REDUCTION: This exam was performed according to the
departmental dose-optimization program which includes automated
exposure control, adjustment of the mA and/or kV according to
patient size and/or use of iterative reconstruction technique.

CONTRAST:  80mL OMNIPAQUE IOHEXOL 350 MG/ML SOLN
MRI brain 03/20/2021. MRA
head 03/27/2021 (images available, report unavailable). CT angiogram
head/neck 03/19/2021. Thyroid ultrasounds 03/04/2021 and 03/08/2021.
FINDINGS: CTA NECK FINDINGS

Aortic arch: Common origin of the innominate and left common carotid
arteries. Atherosclerotic plaque within the visualized aortic arch
and proximal major branch vessels of the neck. No hemodynamically
significant innominate or proximal subclavian artery stenosis.

Right carotid system: CCA and ICA patent within the neck without
stenosis. Mild atherosclerotic plaque about the carotid bifurcation
and within the proximal ICA. Partially retropharyngeal course of the
cervical ICA.

Left carotid system: CCA and ICA patent within the neck without
stenosis. Minimal atherosclerotic plaque within the proximal ICA.

Vertebral arteries: Vertebral arteries patent within the neck
without stenosis. The left vertebral artery is dominant. Mild
nonstenotic calcified plaque at the origin of the right vertebral
artery.

Skeleton: Reversal of the expected cervical lordosis. Cervical
spondylosis. No acute bony abnormality or aggressive osseous lesion.

Other neck: Heterogeneous and multinodular thyroid gland, previously
evaluated by thyroid ultrasound on 03/04/2021. Additionally, there
has been previous FNA of a and nodule within the isthmus. No
cervical lymphadenopathy.

Upper chest: No consolidation within the imaged lung apices.
Biapical parenchymal scarring.

Review of the MIP images confirms the above findings

CTA HEAD FINDINGS

Anterior circulation:

The intracranial internal carotid arteries are patent. Calcified
plaque within both vessels with no more than mild stenosis. The M1
middle cerebral arteries are patent. No M2 proximal branch occlusion
or high-grade proximal stenosis is identified. The anterior cerebral
arteries are patent.

Unchanged 2 mm superiorly projecting vascular protrusion at the
right ophthalmic artery origin (best appreciated on series 10, image
109) (series 8, image 104).

Unchanged 1-2 mm inferiorly projecting vascular protrusion arising
from the supraclinoid left ICA likely reflecting an aneurysm (series
10, image 124).

Posterior circulation:

The intracranial vertebral arteries are patent. The basilar artery
is patent. The posterior cerebral arteries are patent. Both
posterior cerebral arteries demonstrate distal branch
atherosclerotic irregularity. Posterior communicating arteries are
diminutive or absent bilaterally.

Venous sinuses: Within the limitations of contrast timing, no
convincing thrombus.

Anatomic variants: As described.

Review of the MIP images confirms the above findings

No emergent large vessel occlusion identified. These results were
communicated to Dr. Jim at [DATE] pmon 11/27/2021by text page via
the [employer] messaging system.
IMPRESSION: CTA neck:

1. The common carotid, internal carotid and vertebral arteries are
patent within the neck without stenosis.
2.  Aortic Atherosclerosis ([id number]).

CTA head:

1. No intracranial large vessel occlusion or proximal high-grade
arterial stenosis identified.
2. Unchanged 2 mm aneurysm arising near the origin of the right
ophthalmic artery.
3. Unchanged 1-2 mm inferiorly projecting aneurysm arising from the
supraclinoid left ICA.

## 2023-06-28 IMAGING — CT CT HEAD W/O CM
4 of 6 series · 15 of 47 positions shown, 17 images · non-contrast
Comparison: Same day CT

CLINICAL DATA: Headache, new or worsening, neuro deficit (Age
18-49y) neuro change



[Series 3: head wo · axial · 0.43mm/px · z∈[+1142,+1222]mm · 3 of 32 slices shown]
[im 8/32  brain]
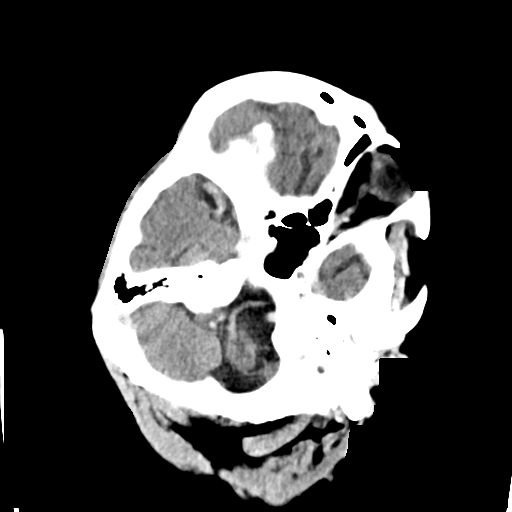
[im 16/32  brain]
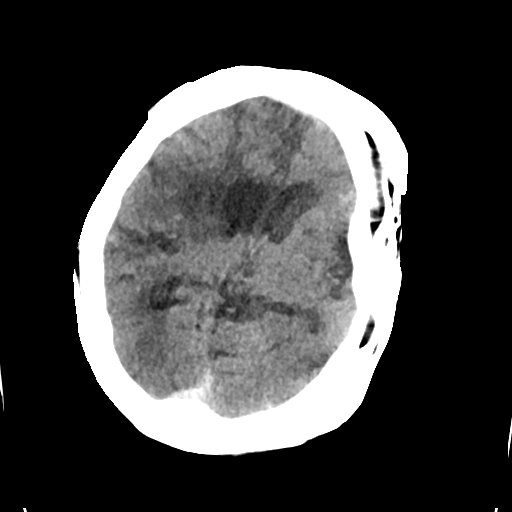
[im 24/32  brain]
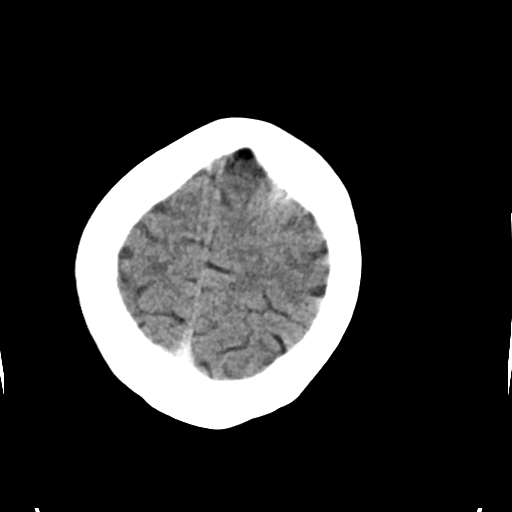

[Series 6: cor soft · coronal · 0.33mm/px · 3 of 66 slices shown]
[im 22/66  brain]
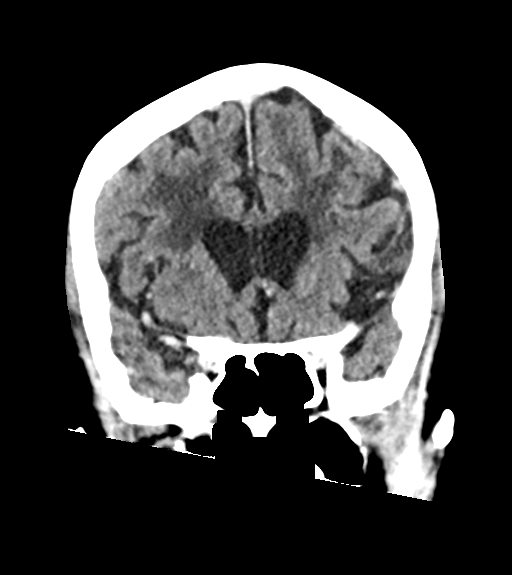
[im 29/66  brain]
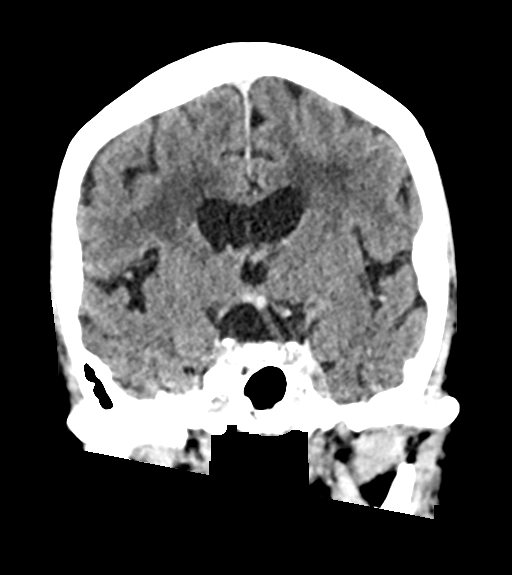
[im 37/66  brain]
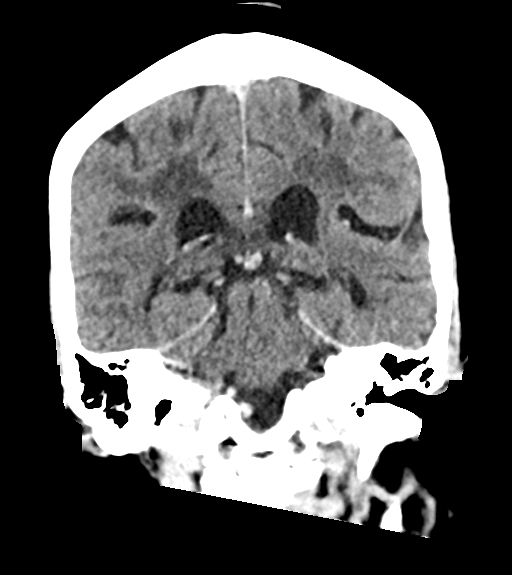

[Series 7: sag soft · sagittal · 0.37mm/px · 3 of 57 slices shown]
[im 23/57  brain]
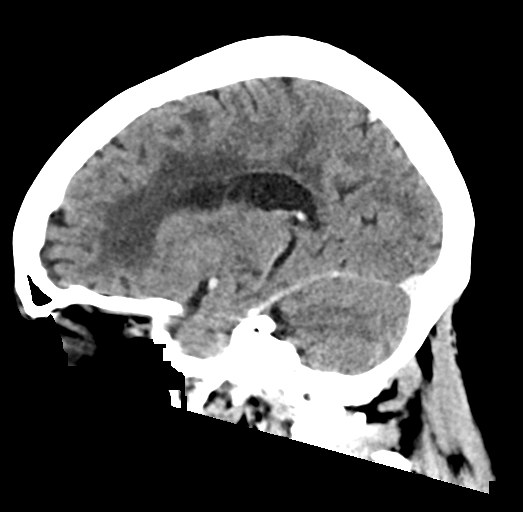
[im 29/57  brain]
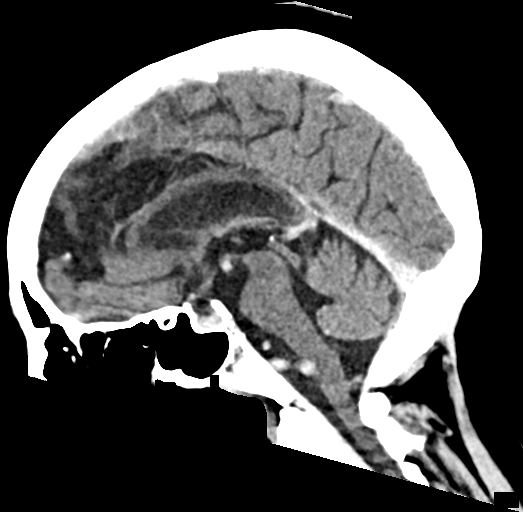
[im 34/57  brain]
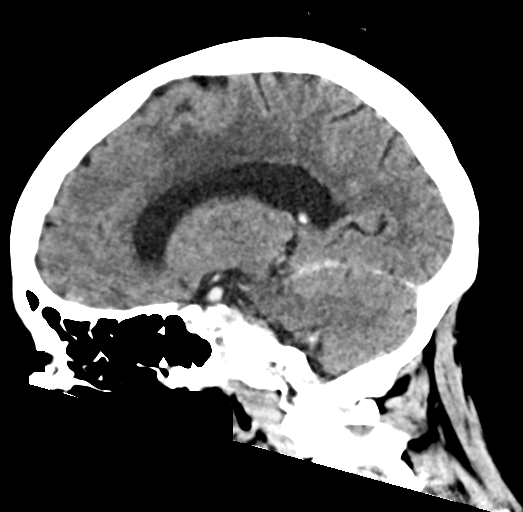

[Series 8: ax head · axial · 0.33mm/px · z∈[+1127,+1235]mm · 6 of 53 slices shown, 8 images]
[im 8/53  brain]
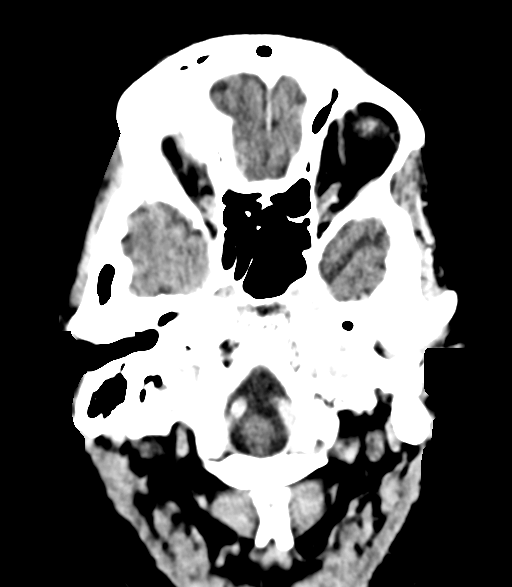
[im 8/53  bone]
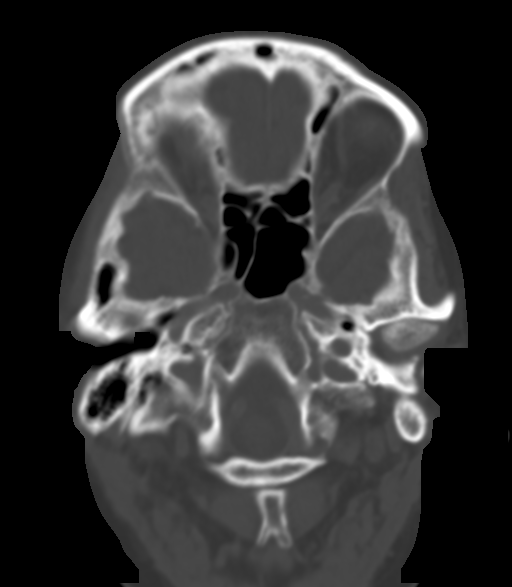
[im 15/53  brain]
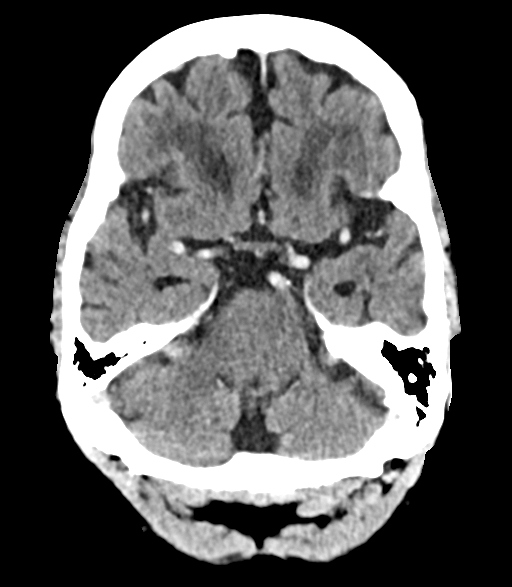
[im 23/53  brain]
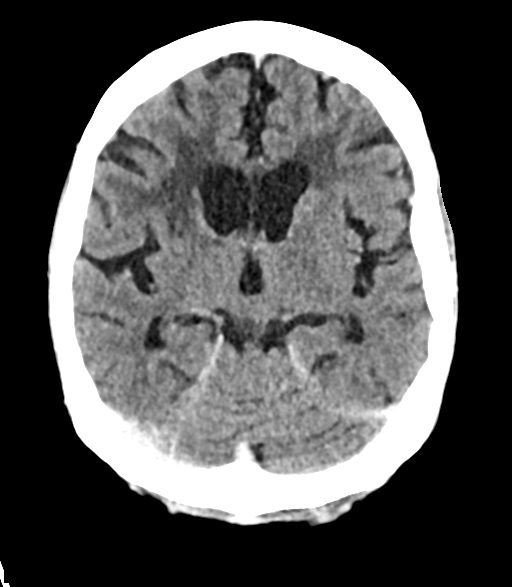
[im 30/53  brain]
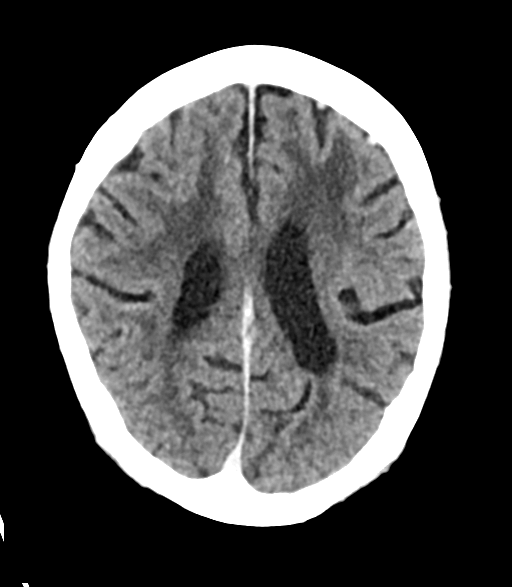
[im 38/53  brain]
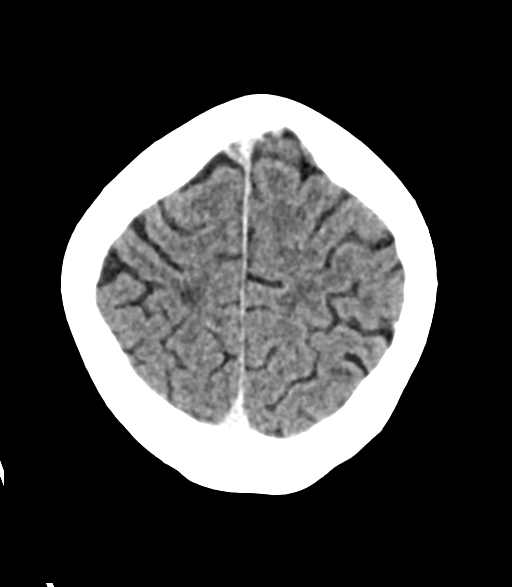
[im 38/53  bone]
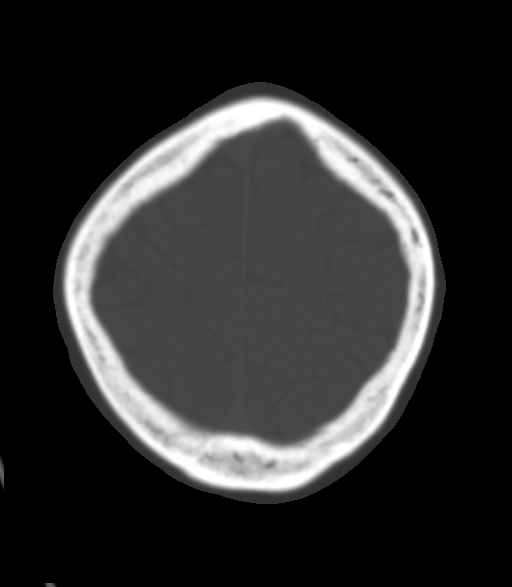
[im 45/53  brain]
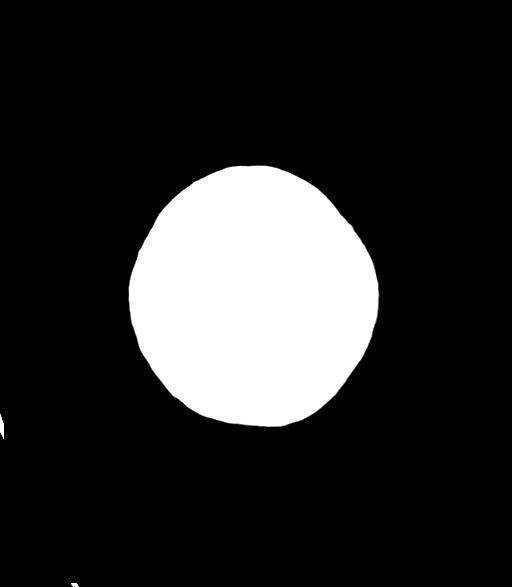

[15 of 47 positions shown; findings below may reference images not displayed]

FINDINGS: Brain: Acute 7 x 11 x 10 mm intraparenchymal hemorrhage in the left
aspect of the pons/midbrain. No evidence of acute large vascular
territory infarct. Patchy white matter hypoattenuation, nonspecific
but compatible with chronic microvascular disease. No hydrocephalus.
Similar atrophy with ex vacuo ventricular dilation. No midline
shift.

Vascular: Retained vasculature recent CTA. Please see that study for
evaluation of vasculature.

Skull: No acute fracture.

Sinuses/Orbits: Clear sinuses.  Unremarkable orbits.

Other: No mastoid effusions.
IMPRESSION: Acute 11 mm intraparenchymal hemorrhage in the left aspect of the
pons/midbrain.

Findings discussed with Dr. Rambriksh via telephone at [DATE] p.m.

## 2023-07-02 IMAGING — DX DG CHEST 1V PORT
1 series · 1 of 1 positions shown · non-contrast
Comparison: 11/27/2021

CLINICAL DATA: Fever

EXAM:
PORTABLE CHEST 1 VIEW

[chest ap]
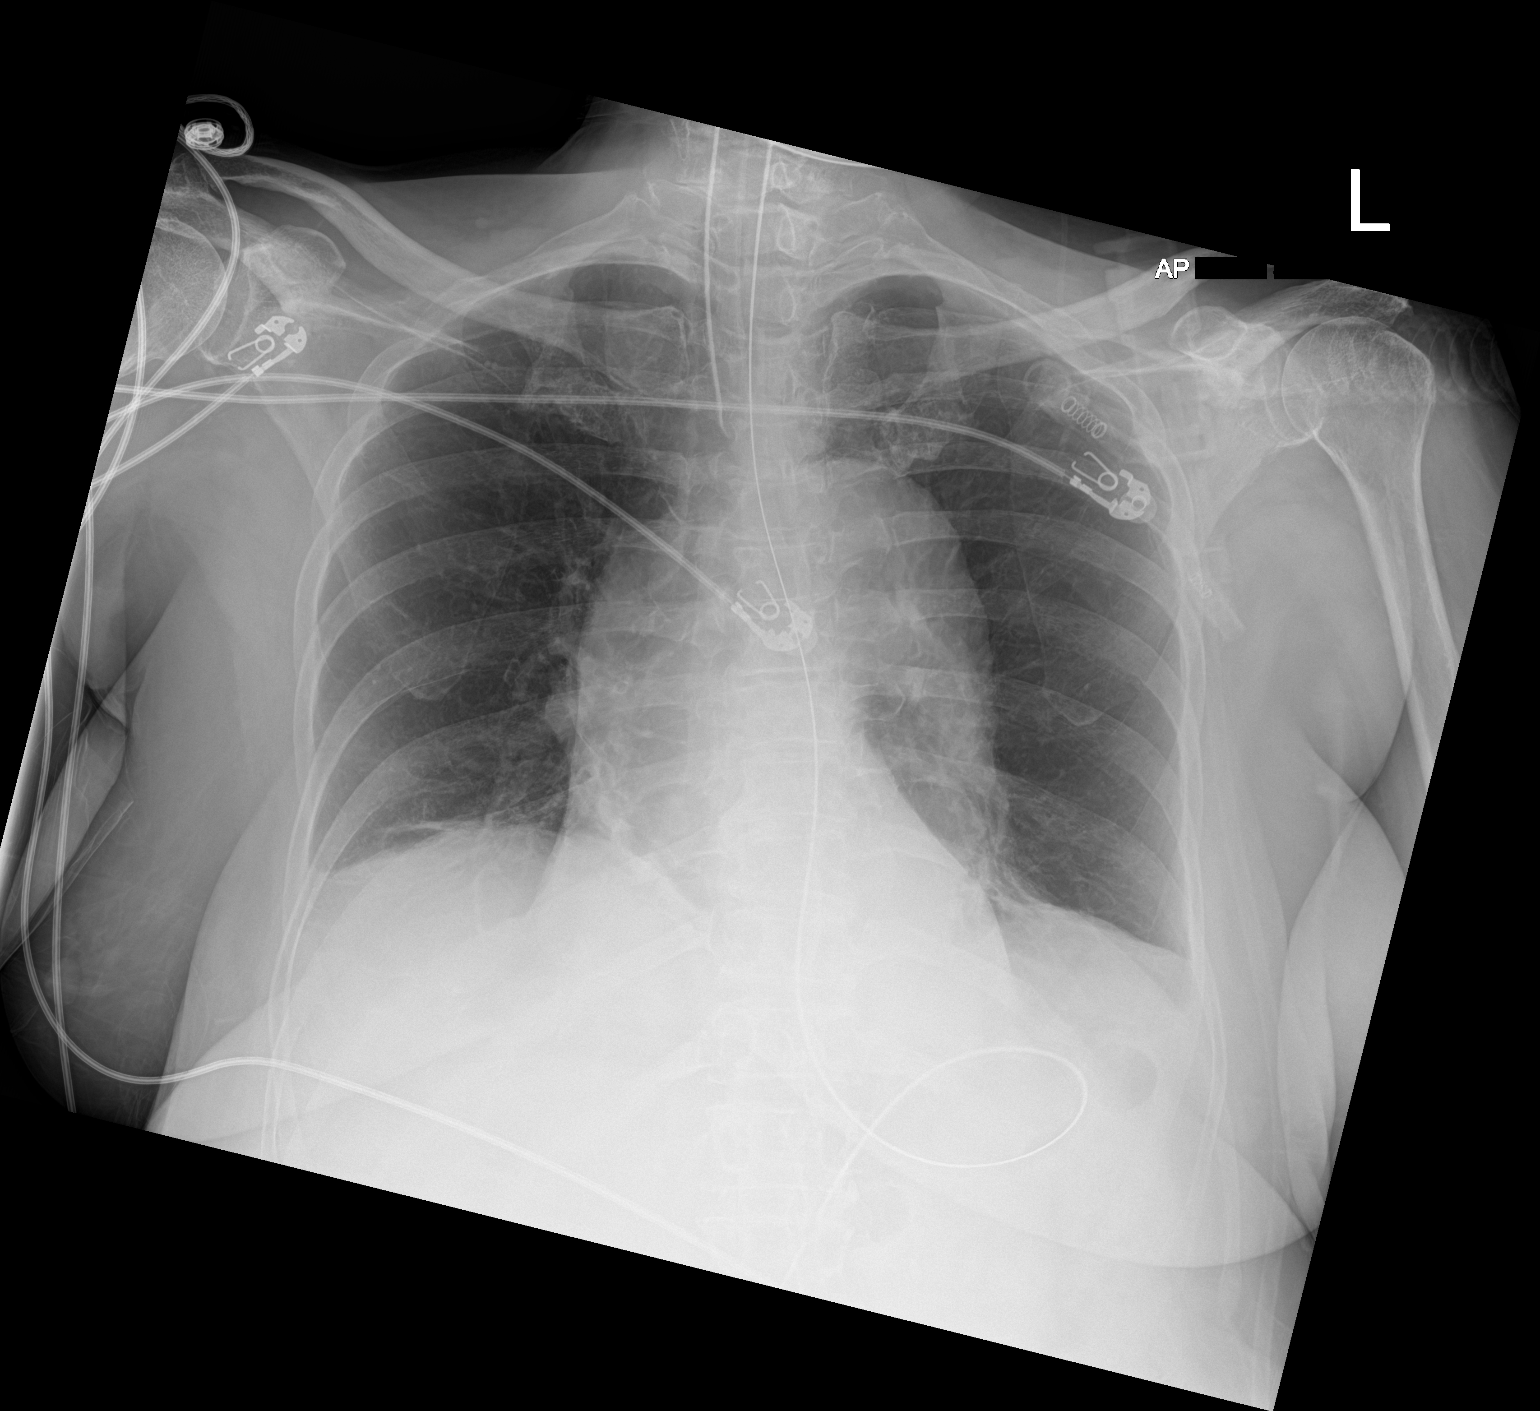

[1 of 1 positions shown; findings below may reference images not displayed]

FINDINGS: Cardiac size is within normal limits. There is ectasia of thoracic
aorta. Tip of endotracheal tube is approximately 5 cm above the
carina. Enteric tube is noted traversing the esophagus. Small linear
densities seen in the lower lung fields. There are no signs of
pulmonary edema. There is no significant pleural effusion or
pneumothorax.
IMPRESSION: Small linear densities in the lower lung fields suggest subsegmental
atelectasis.
